# Patient Record
Sex: Female | Born: 1950
Health system: Southern US, Community
[De-identification: ages and names within clinical notes are randomized; demographics above are authoritative.]

## PROBLEM LIST (undated history)

## (undated) DIAGNOSIS — M1990 Unspecified osteoarthritis, unspecified site: Secondary | ICD-10-CM

## (undated) DIAGNOSIS — N200 Calculus of kidney: Secondary | ICD-10-CM

## (undated) DIAGNOSIS — I10 Essential (primary) hypertension: Secondary | ICD-10-CM

## (undated) DIAGNOSIS — E039 Hypothyroidism, unspecified: Secondary | ICD-10-CM

## (undated) DIAGNOSIS — D649 Anemia, unspecified: Secondary | ICD-10-CM

## (undated) DIAGNOSIS — K219 Gastro-esophageal reflux disease without esophagitis: Secondary | ICD-10-CM

## (undated) DIAGNOSIS — Z9889 Other specified postprocedural states: Secondary | ICD-10-CM

## (undated) DIAGNOSIS — R112 Nausea with vomiting, unspecified: Secondary | ICD-10-CM

## (undated) DIAGNOSIS — L89159 Pressure ulcer of sacral region, unspecified stage: Secondary | ICD-10-CM

## (undated) DIAGNOSIS — E119 Type 2 diabetes mellitus without complications: Secondary | ICD-10-CM

## (undated) DIAGNOSIS — C50911 Malignant neoplasm of unspecified site of right female breast: Secondary | ICD-10-CM

## (undated) DIAGNOSIS — G629 Polyneuropathy, unspecified: Secondary | ICD-10-CM

## (undated) DIAGNOSIS — G542 Cervical root disorders, not elsewhere classified: Secondary | ICD-10-CM

## (undated) DIAGNOSIS — Z87442 Personal history of urinary calculi: Secondary | ICD-10-CM

## (undated) DIAGNOSIS — A419 Sepsis, unspecified organism: Secondary | ICD-10-CM

## (undated) DIAGNOSIS — E7800 Pure hypercholesterolemia, unspecified: Secondary | ICD-10-CM

## (undated) DIAGNOSIS — G8220 Paraplegia, unspecified: Secondary | ICD-10-CM

## (undated) DIAGNOSIS — N319 Neuromuscular dysfunction of bladder, unspecified: Secondary | ICD-10-CM

## (undated) DIAGNOSIS — M869 Osteomyelitis, unspecified: Secondary | ICD-10-CM

## (undated) HISTORY — PX: ABDOMINAL HYSTERECTOMY: SHX81

## (undated) HISTORY — PX: DACROCYSTORHINOSTOMY: SHX1435

## (undated) HISTORY — PX: TONSILLECTOMY AND ADENOIDECTOMY: SUR1326

## (undated) HISTORY — DX: Cervical root disorders, not elsewhere classified: G54.2

## (undated) HISTORY — PX: BACK SURGERY: SHX140

## (undated) HISTORY — DX: Essential (primary) hypertension: I10

## (undated) HISTORY — DX: Unspecified osteoarthritis, unspecified site: M19.90

## (undated) HISTORY — PX: JOINT REPLACEMENT: SHX530

## (undated) HISTORY — PX: CALDWELL LUC: SHX1284

---

## 1988-12-06 DIAGNOSIS — N200 Calculus of kidney: Secondary | ICD-10-CM

## 1988-12-06 HISTORY — DX: Calculus of kidney: N20.0

## 2002-04-07 HISTORY — PX: EYE SURGERY: SHX253

## 2004-04-07 HISTORY — PX: POSTERIOR CERVICAL LAMINECTOMY: SHX2248

## 2004-12-03 ENCOUNTER — Encounter: Admission: RE | Admit: 2004-12-03 | Discharge: 2005-03-03 | Payer: Self-pay | Admitting: Neurosurgery

## 2005-03-05 ENCOUNTER — Encounter: Admission: RE | Admit: 2005-03-05 | Discharge: 2005-06-03 | Payer: Self-pay | Admitting: Neurosurgery

## 2005-06-04 ENCOUNTER — Encounter: Admission: RE | Admit: 2005-06-04 | Discharge: 2005-09-02 | Payer: Self-pay | Admitting: Neurosurgery

## 2006-07-10 ENCOUNTER — Encounter
Admission: RE | Admit: 2006-07-10 | Discharge: 2006-10-08 | Payer: Self-pay | Admitting: Physical Medicine and Rehabilitation

## 2006-10-09 ENCOUNTER — Encounter
Admission: RE | Admit: 2006-10-09 | Discharge: 2007-01-07 | Payer: Self-pay | Admitting: Physical Medicine and Rehabilitation

## 2009-01-31 ENCOUNTER — Encounter
Admission: RE | Admit: 2009-01-31 | Discharge: 2009-04-04 | Payer: Self-pay | Admitting: Physical Medicine and Rehabilitation

## 2009-04-07 DIAGNOSIS — C50911 Malignant neoplasm of unspecified site of right female breast: Secondary | ICD-10-CM

## 2009-04-07 HISTORY — PX: BREAST LUMPECTOMY: SHX2

## 2009-04-07 HISTORY — PX: BREAST BIOPSY: SHX20

## 2009-04-07 HISTORY — DX: Malignant neoplasm of unspecified site of right female breast: C50.911

## 2009-12-25 ENCOUNTER — Encounter: Admission: RE | Admit: 2009-12-25 | Discharge: 2009-12-25 | Payer: Self-pay | Admitting: Radiology

## 2010-01-17 ENCOUNTER — Encounter: Admission: RE | Admit: 2010-01-17 | Discharge: 2010-01-17 | Payer: Self-pay | Admitting: General Surgery

## 2010-01-22 ENCOUNTER — Ambulatory Visit (HOSPITAL_BASED_OUTPATIENT_CLINIC_OR_DEPARTMENT_OTHER): Admission: RE | Admit: 2010-01-22 | Discharge: 2010-01-22 | Payer: Self-pay | Admitting: General Surgery

## 2010-01-23 ENCOUNTER — Ambulatory Visit: Payer: Self-pay | Admitting: Oncology

## 2010-03-05 ENCOUNTER — Ambulatory Visit
Admission: RE | Admit: 2010-03-05 | Discharge: 2010-05-07 | Payer: Self-pay | Source: Home / Self Care | Attending: Radiation Oncology | Admitting: Radiation Oncology

## 2010-04-18 ENCOUNTER — Ambulatory Visit: Payer: Self-pay | Admitting: Oncology

## 2010-04-19 LAB — COMPREHENSIVE METABOLIC PANEL
ALT: 17 U/L (ref 0–35)
AST: 21 U/L (ref 0–37)
Albumin: 4.3 g/dL (ref 3.5–5.2)
Alkaline Phosphatase: 75 U/L (ref 39–117)
BUN: 12 mg/dL (ref 6–23)
CO2: 25 mEq/L (ref 19–32)
Calcium: 9.6 mg/dL (ref 8.4–10.5)
Chloride: 106 mEq/L (ref 96–112)
Creatinine, Ser: 0.83 mg/dL (ref 0.40–1.20)
Glucose, Bld: 105 mg/dL — ABNORMAL HIGH (ref 70–99)
Potassium: 4 mEq/L (ref 3.5–5.3)
Sodium: 140 mEq/L (ref 135–145)
Total Bilirubin: 0.4 mg/dL (ref 0.3–1.2)
Total Protein: 7.9 g/dL (ref 6.0–8.3)

## 2010-04-19 LAB — CBC WITH DIFFERENTIAL/PLATELET
BASO%: 0.3 % (ref 0.0–2.0)
Basophils Absolute: 0 10*3/uL (ref 0.0–0.1)
EOS%: 2.4 % (ref 0.0–7.0)
Eosinophils Absolute: 0.1 10*3/uL (ref 0.0–0.5)
HCT: 40.3 % (ref 34.8–46.6)
HGB: 14 g/dL (ref 11.6–15.9)
LYMPH%: 25.4 % (ref 14.0–49.7)
MCH: 32.6 pg (ref 25.1–34.0)
MCHC: 34.7 g/dL (ref 31.5–36.0)
MCV: 93.9 fL (ref 79.5–101.0)
MONO#: 0.4 10*3/uL (ref 0.1–0.9)
MONO%: 14.6 % — ABNORMAL HIGH (ref 0.0–14.0)
NEUT#: 1.6 10*3/uL (ref 1.5–6.5)
NEUT%: 57.3 % (ref 38.4–76.8)
Platelets: 165 10*3/uL (ref 145–400)
RBC: 4.29 10*6/uL (ref 3.70–5.45)
RDW: 14 % (ref 11.2–14.5)
WBC: 2.9 10*3/uL — ABNORMAL LOW (ref 3.9–10.3)
lymph#: 0.7 10*3/uL — ABNORMAL LOW (ref 0.9–3.3)
nRBC: 0 % (ref 0–0)

## 2010-04-19 LAB — VITAMIN D 25 HYDROXY (VIT D DEFICIENCY, FRACTURES): Vit D, 25-Hydroxy: 35 ng/mL (ref 30–89)

## 2010-05-08 ENCOUNTER — Ambulatory Visit: Payer: No Typology Code available for payment source | Admitting: Radiation Oncology

## 2010-05-15 ENCOUNTER — Ambulatory Visit: Payer: No Typology Code available for payment source | Attending: Radiation Oncology | Admitting: Radiation Oncology

## 2010-05-15 DIAGNOSIS — L988 Other specified disorders of the skin and subcutaneous tissue: Secondary | ICD-10-CM | POA: Insufficient documentation

## 2010-05-15 DIAGNOSIS — Y842 Radiological procedure and radiotherapy as the cause of abnormal reaction of the patient, or of later complication, without mention of misadventure at the time of the procedure: Secondary | ICD-10-CM | POA: Insufficient documentation

## 2010-05-15 DIAGNOSIS — C50919 Malignant neoplasm of unspecified site of unspecified female breast: Secondary | ICD-10-CM | POA: Insufficient documentation

## 2010-05-31 ENCOUNTER — Ambulatory Visit: Payer: No Typology Code available for payment source | Attending: Radiation Oncology | Admitting: Radiation Oncology

## 2010-06-20 LAB — COMPREHENSIVE METABOLIC PANEL
ALT: 27 U/L (ref 0–35)
Albumin: 4 g/dL (ref 3.5–5.2)
Alkaline Phosphatase: 72 U/L (ref 39–117)
Potassium: 3.9 mEq/L (ref 3.5–5.1)
Sodium: 141 mEq/L (ref 135–145)
Total Protein: 7.4 g/dL (ref 6.0–8.3)

## 2010-06-20 LAB — CBC
HCT: 39.3 % (ref 36.0–46.0)
Hemoglobin: 13.4 g/dL (ref 12.0–15.0)
MCH: 33 pg (ref 26.0–34.0)
MCHC: 34.1 g/dL (ref 30.0–36.0)
MCV: 96.8 fL (ref 78.0–100.0)
Platelets: 206 10*3/uL (ref 150–400)
RBC: 4.06 MIL/uL (ref 3.87–5.11)
RDW: 13.9 % (ref 11.5–15.5)
WBC: 4 10*3/uL (ref 4.0–10.5)

## 2010-06-20 LAB — DIFFERENTIAL
Basophils Relative: 1 % (ref 0–1)
Eosinophils Absolute: 0.1 10*3/uL (ref 0.0–0.7)
Monocytes Absolute: 0.6 10*3/uL (ref 0.1–1.0)
Monocytes Relative: 14 % — ABNORMAL HIGH (ref 3–12)

## 2010-06-20 LAB — URINALYSIS, ROUTINE W REFLEX MICROSCOPIC
Bilirubin Urine: NEGATIVE
Hgb urine dipstick: NEGATIVE
Specific Gravity, Urine: 1.023 (ref 1.005–1.030)
Urobilinogen, UA: 1 mg/dL (ref 0.0–1.0)

## 2010-07-19 ENCOUNTER — Encounter (HOSPITAL_BASED_OUTPATIENT_CLINIC_OR_DEPARTMENT_OTHER): Payer: No Typology Code available for payment source | Admitting: Oncology

## 2010-07-19 ENCOUNTER — Other Ambulatory Visit: Payer: Self-pay | Admitting: Oncology

## 2010-07-19 DIAGNOSIS — C50319 Malignant neoplasm of lower-inner quadrant of unspecified female breast: Secondary | ICD-10-CM

## 2010-07-19 LAB — CBC WITH DIFFERENTIAL/PLATELET
Basophils Absolute: 0 10*3/uL (ref 0.0–0.1)
Eosinophils Absolute: 0.1 10*3/uL (ref 0.0–0.5)
HCT: 37.7 % (ref 34.8–46.6)
LYMPH%: 24.6 % (ref 14.0–49.7)
MCV: 94.3 fL (ref 79.5–101.0)
MONO%: 10.9 % (ref 0.0–14.0)
NEUT#: 2.5 10*3/uL (ref 1.5–6.5)
NEUT%: 61.5 % (ref 38.4–76.8)
Platelets: 205 10*3/uL (ref 145–400)
RBC: 4 10*6/uL (ref 3.70–5.45)
nRBC: 0 % (ref 0–0)

## 2010-07-19 LAB — COMPREHENSIVE METABOLIC PANEL
BUN: 13 mg/dL (ref 6–23)
CO2: 26 mEq/L (ref 19–32)
Calcium: 9.7 mg/dL (ref 8.4–10.5)
Creatinine, Ser: 0.93 mg/dL (ref 0.40–1.20)
Glucose, Bld: 106 mg/dL — ABNORMAL HIGH (ref 70–99)
Total Bilirubin: 0.4 mg/dL (ref 0.3–1.2)

## 2010-07-19 LAB — VITAMIN D 25 HYDROXY (VIT D DEFICIENCY, FRACTURES): Vit D, 25-Hydroxy: 43 ng/mL (ref 30–89)

## 2010-09-12 ENCOUNTER — Encounter (INDEPENDENT_AMBULATORY_CARE_PROVIDER_SITE_OTHER): Payer: Self-pay | Admitting: General Surgery

## 2010-12-12 ENCOUNTER — Encounter (INDEPENDENT_AMBULATORY_CARE_PROVIDER_SITE_OTHER): Payer: Self-pay | Admitting: General Surgery

## 2010-12-12 ENCOUNTER — Ambulatory Visit (INDEPENDENT_AMBULATORY_CARE_PROVIDER_SITE_OTHER): Payer: No Typology Code available for payment source | Admitting: General Surgery

## 2010-12-12 VITALS — BP 122/72 | HR 76

## 2010-12-12 DIAGNOSIS — Z853 Personal history of malignant neoplasm of breast: Secondary | ICD-10-CM

## 2010-12-12 NOTE — Patient Instructions (Signed)
Your physical exam today shows radiation changes in the right breast, but no evidence of any cancer. Your mammograms are reportedly normal, and I'll review them later today. Continue taking the arimidex. Keep all of your office appointments with Dr. Pierce Crane. I will see you back in one year after you get your annual mammograms.

## 2010-12-12 NOTE — Progress Notes (Signed)
Chief Complaint  Patient presents with  . Follow-up    f/u right breast reck    HPI [name] is a 60 y.o. female.    This patient underwent right partial mastectomy and sentinel lymph node biopsy on January 22, 2010. Pathologically she had a T1c., N0, receptor positive, Ki-67 7%, HER-2-negative invasive ductal carcinoma. She underwent adjuvant radiation therapy under the guidance of Dr. Mitzi Hansen. She is taking arimkidex and is being followed closely by Dr. Pierce Crane.  She has no complaints about her breast. There is no pain or masses that she perceives. She had mammograms last week and [facility] and reported these are normal. We have requested those films and reports. HPI  Past Medical History  Diagnosis Date  . Hypertension   . Cervical neuropathy   . Chronic kidney disease   . Arthritis   . Cancer     Past Surgical History  Procedure Date  . Abdominal hysterectomy   . Tonsillectomy and adenoidectomy   . Caldwell luc   . Posterior cervical laminectomy   . Eye surgery 2004  . Breast surgery 2011    right lumpectomy    Family History  Problem Relation Age of Onset  . Heart disease Father     Social History History  Substance Use Topics  . Smoking status: Current Everyday Smoker -- 0.5 packs/day  . Smokeless tobacco: Not on file  . Alcohol Use: No    Allergies  Allergen Reactions  . Aspirin   . Ivp Dye (Iodinated Diagnostic Agents)     Current Outpatient Prescriptions  Medication Sig Dispense Refill  . anastrozole (ARIMIDEX) 1 MG tablet Take 1 mg by mouth daily.        . Calcium Carbonate-Vitamin D (CALCIUM 600+D) 600-200 MG-UNIT TABS Take by mouth daily.        . DULoxetine (CYMBALTA) 60 MG capsule Take 60 mg by mouth daily.        . folic acid (FOLVITE) 1 MG tablet Take 1 mg by mouth daily.        . furosemide (LASIX) 40 MG tablet Take 40 mg by mouth daily.        . Gabapentin, PHN, 600 MG TABS Take by mouth daily.        Marland Kitchen levothyroxine  (SYNTHROID, LEVOTHROID) 125 MCG tablet Take 125 mcg by mouth daily.        . magnesium oxide (MAG-OX) 400 MG tablet Take 400 mg by mouth 2 (two) times daily.        . metoprolol (TOPROL-XL) 100 MG 24 hr tablet Take 100 mg by mouth daily.        . MORPHINE SULFATE PO Take 10 mg by mouth 3 (three) times daily.        Marland Kitchen NIFEdipine (PROCARDIA XL/ADALAT-CC) 30 MG 24 hr tablet Take 30 mg by mouth daily.        . OXYCODONE HCL PO Take by mouth 4 (four) times daily. 10-650 mg       . zolpidem (AMBIEN) 10 MG tablet Take 10 mg by mouth at bedtime as needed.          Review of Systems Review of Systems 12 system review of systems is performed and is negative except as described above. Blood pressure 122/72, pulse 76.  Physical Exam Physical Exam Patient is in good spirits.  Neck reveals no adenopathy or mass. She cannot flex or extend her neck which is a chronic problem.  Lungs clear to  auscultation.  Breast right axillary wound is well-healed. There is no residual fluid. The right breast incision medially as well healed. Right breast tanning rom the radiation therapy, but no masses. Left breast is soft. Skin is healthy. There is no axillary adenopathy.Data Reviewed   Assessment    Invasive ductal carcinoma right breast, pathology stage TI C., N0, receptor positive, HER-2 negative. No evidence of recurrence one year following lumpectomy, sentinel biopsy, adjuvant radiation therapy and adjuvant arimidex.    Plan    Our review her recent mammograms.  Continue arimidex.  Return to see me in one year after her annual mammograms are done.       Kahley Leib M 12/12/2010, 1:02 PM

## 2011-02-17 ENCOUNTER — Ambulatory Visit: Payer: No Typology Code available for payment source | Admitting: Oncology

## 2011-02-17 ENCOUNTER — Other Ambulatory Visit: Payer: Self-pay | Admitting: Oncology

## 2011-02-17 ENCOUNTER — Other Ambulatory Visit (HOSPITAL_BASED_OUTPATIENT_CLINIC_OR_DEPARTMENT_OTHER): Payer: No Typology Code available for payment source | Admitting: Lab

## 2011-02-17 ENCOUNTER — Telehealth: Payer: Self-pay | Admitting: Oncology

## 2011-02-17 VITALS — BP 114/72 | HR 92 | Temp 97.4°F | Ht 62.0 in | Wt 209.5 lb

## 2011-02-17 DIAGNOSIS — C50919 Malignant neoplasm of unspecified site of unspecified female breast: Secondary | ICD-10-CM

## 2011-02-17 DIAGNOSIS — C50319 Malignant neoplasm of lower-inner quadrant of unspecified female breast: Secondary | ICD-10-CM

## 2011-02-17 LAB — COMPREHENSIVE METABOLIC PANEL
ALT: 15 U/L (ref 0–35)
AST: 22 U/L (ref 0–37)
Albumin: 4.1 g/dL (ref 3.5–5.2)
BUN: 14 mg/dL (ref 6–23)
CO2: 27 mEq/L (ref 19–32)
Calcium: 9.9 mg/dL (ref 8.4–10.5)
Chloride: 104 mEq/L (ref 96–112)
Creatinine, Ser: 0.8 mg/dL (ref 0.50–1.10)
Potassium: 4 mEq/L (ref 3.5–5.3)

## 2011-02-17 LAB — CBC WITH DIFFERENTIAL/PLATELET
Basophils Absolute: 0 10*3/uL (ref 0.0–0.1)
HCT: 36 % (ref 34.8–46.6)
HGB: 12.5 g/dL (ref 11.6–15.9)
MONO#: 0.4 10*3/uL (ref 0.1–0.9)
NEUT#: 2.4 10*3/uL (ref 1.5–6.5)
NEUT%: 66.6 % (ref 38.4–76.8)
RDW: 13.9 % (ref 11.2–14.5)
WBC: 3.6 10*3/uL — ABNORMAL LOW (ref 3.9–10.3)
lymph#: 0.7 10*3/uL — ABNORMAL LOW (ref 0.9–3.3)

## 2011-02-17 LAB — CANCER ANTIGEN 27.29: CA 27.29: 18 U/mL (ref 0–39)

## 2011-02-17 NOTE — Telephone Encounter (Signed)
Gv pt appt for may2013 

## 2011-02-17 NOTE — Progress Notes (Signed)
err

## 2011-05-16 ENCOUNTER — Other Ambulatory Visit: Payer: Self-pay | Admitting: *Deleted

## 2011-05-16 DIAGNOSIS — C50319 Malignant neoplasm of lower-inner quadrant of unspecified female breast: Secondary | ICD-10-CM

## 2011-05-16 MED ORDER — ANASTROZOLE 1 MG PO TABS: 1.0000 mg | ORAL_TABLET | Freq: Every day | ORAL | Status: DC

## 2011-06-06 NOTE — Telephone Encounter (Signed)
Received fax from [facility] stating pt has requested new prescription for anastrozole to be filled through them.  Called them at [phone], and spoke with pharmacist, to give pt 1 time 90 day supply of anastrozole 1 mg po daily (until next MD visit).

## 2011-08-07 ENCOUNTER — Telehealth: Payer: Self-pay | Admitting: Oncology

## 2011-08-07 NOTE — Telephone Encounter (Signed)
lmonvm adviisng the pt of her cancelled may appts that were r/s fro June due to the md will be out of the office

## 2011-08-22 ENCOUNTER — Other Ambulatory Visit: Payer: No Typology Code available for payment source | Admitting: Lab

## 2011-08-22 ENCOUNTER — Ambulatory Visit: Payer: No Typology Code available for payment source | Admitting: Oncology

## 2011-09-12 ENCOUNTER — Other Ambulatory Visit: Payer: Self-pay | Admitting: Physical Medicine and Rehabilitation

## 2011-09-12 DIAGNOSIS — M4712 Other spondylosis with myelopathy, cervical region: Secondary | ICD-10-CM

## 2011-09-12 DIAGNOSIS — IMO0002 Reserved for concepts with insufficient information to code with codable children: Secondary | ICD-10-CM

## 2011-09-18 ENCOUNTER — Other Ambulatory Visit: Payer: No Typology Code available for payment source

## 2011-09-18 ENCOUNTER — Inpatient Hospital Stay: Admission: RE | Admit: 2011-09-18 | Payer: No Typology Code available for payment source | Source: Ambulatory Visit

## 2011-09-22 ENCOUNTER — Other Ambulatory Visit: Payer: Self-pay | Admitting: *Deleted

## 2011-09-22 DIAGNOSIS — C50319 Malignant neoplasm of lower-inner quadrant of unspecified female breast: Secondary | ICD-10-CM

## 2011-09-22 MED ORDER — ANASTROZOLE 1 MG PO TABS: 1.0000 mg | ORAL_TABLET | Freq: Every day | ORAL | Status: DC

## 2011-09-22 NOTE — Telephone Encounter (Signed)
Patient out of anastrozole, ordered 30 day supply until she sees dr Donnie Coffin this month.

## 2011-09-23 ENCOUNTER — Ambulatory Visit
Admission: RE | Admit: 2011-09-23 | Discharge: 2011-09-23 | Disposition: A | Discharge status: Home / Self Care | Payer: Medicare HMO | Source: Ambulatory Visit | Attending: Physical Medicine and Rehabilitation | Admitting: Physical Medicine and Rehabilitation

## 2011-09-23 DIAGNOSIS — IMO0002 Reserved for concepts with insufficient information to code with codable children: Secondary | ICD-10-CM

## 2011-09-23 DIAGNOSIS — M4712 Other spondylosis with myelopathy, cervical region: Secondary | ICD-10-CM

## 2011-09-23 MED ORDER — GADOBENATE DIMEGLUMINE 529 MG/ML IV SOLN
17.0000 mL | Freq: Once | INTRAVENOUS | Status: AC | PRN
  Administered 2011-09-23: 17 mL via INTRAVENOUS

## 2011-09-29 ENCOUNTER — Telehealth: Payer: Self-pay | Admitting: *Deleted

## 2011-09-29 ENCOUNTER — Other Ambulatory Visit (HOSPITAL_BASED_OUTPATIENT_CLINIC_OR_DEPARTMENT_OTHER): Payer: Medicare HMO | Admitting: Lab

## 2011-09-29 ENCOUNTER — Ambulatory Visit (HOSPITAL_BASED_OUTPATIENT_CLINIC_OR_DEPARTMENT_OTHER): Payer: Medicare HMO | Admitting: Oncology

## 2011-09-29 VITALS — BP 117/70 | HR 91 | Temp 97.7°F | Ht 62.0 in | Wt 212.7 lb

## 2011-09-29 DIAGNOSIS — E559 Vitamin D deficiency, unspecified: Secondary | ICD-10-CM

## 2011-09-29 DIAGNOSIS — M25569 Pain in unspecified knee: Secondary | ICD-10-CM

## 2011-09-29 DIAGNOSIS — M542 Cervicalgia: Secondary | ICD-10-CM

## 2011-09-29 DIAGNOSIS — C50919 Malignant neoplasm of unspecified site of unspecified female breast: Secondary | ICD-10-CM

## 2011-09-29 LAB — CBC WITH DIFFERENTIAL/PLATELET
Basophils Absolute: 0 10*3/uL (ref 0.0–0.1)
EOS%: 1.4 % (ref 0.0–7.0)
Eosinophils Absolute: 0.1 10*3/uL (ref 0.0–0.5)
HCT: 35.3 % (ref 34.8–46.6)
HGB: 12.1 g/dL (ref 11.6–15.9)
LYMPH%: 22.2 % (ref 14.0–49.7)
MCH: 34.2 pg — ABNORMAL HIGH (ref 25.1–34.0)
MCV: 99.9 fL (ref 79.5–101.0)
MONO%: 6.7 % (ref 0.0–14.0)
NEUT#: 3.5 10*3/uL (ref 1.5–6.5)
NEUT%: 69.2 % (ref 38.4–76.8)
Platelets: 131 10*3/uL — ABNORMAL LOW (ref 145–400)
RDW: 13.4 % (ref 11.2–14.5)

## 2011-09-29 NOTE — Progress Notes (Signed)
Hematology and Oncology Follow Up Visit  [name] [id number] [birth year] 61 y.o. 09/29/2011 1:55 PM   DIAGNOSIS: 89 with a history of ER/PR positive HER-2 negative breast cancer status post lumpectomy 05/25/2009, status post radiation therapy completed 05/07/2010 on Arimidex, T1 CN 0    PAST THERAPY: As above   Interim History:  Patient is doing relatively well for breast cancer point of view. She is having some chronic knee pain in addition to neck pain she's had further investigations regarding her history of spinal stenosis and cervical myelopathy. She has active involvement neurosurgery. She is due for followup mammogram in August. She is taking Arimidex without significant side effects.  Medications: I have reviewed the patient's current medications.  Allergies:  Allergies  Allergen Reactions  . Aspirin   . Ivp Dye (Iodinated Diagnostic Agents)     Past Medical History, Surgical history, Social history, and Family History were reviewed and updated.  Review of Systems: Constitutional:  Negative for fever, chills, night sweats, anorexia, weight loss, pain. Cardiovascular: no chest pain or dyspnea on exertion Respiratory: negative Neurological: negative Dermatological: negative ENT: negative Skin Gastrointestinal: negative Genito-Urinary: negative Hematological and Lymphatic: negative Breast: negative Musculoskeletal: negative Remaining ROS negative.  Physical Exam:  Blood pressure 117/70, pulse 91, temperature 97.7 F (36.5 C), height 5\' 2"  (1.575 m), weight 212 lb 11.2 oz (96.48 kg).  ECOG: 1   General appearance: alert, cooperative and appears stated age Throat: lips, mucosa, and tongue normal; teeth and gums normal Resp: clear to auscultation bilaterally and normal percussion bilaterally Breasts: normal appearance, no masses or tenderness Cardio: regular rate and rhythm, S1, S2 normal, no murmur, click, rub or gallop and normal apical impulse GI: soft,  non-tender; bowel sounds normal; no masses,  no organomegaly Extremities: extremities normal, atraumatic, no cyanosis or edema Lymph nodes: Cervical, supraclavicular, and axillary nodes normal. Neurologic: Grossly normal   Lab Results: Lab Results  Component Value Date   WBC 5.0 09/29/2011   HGB 12.1 09/29/2011   HCT 35.3 09/29/2011   MCV 99.9 09/29/2011   PLT 131* 09/29/2011     Chemistry      Component Value Date/Time   NA 143 02/17/2011 1028   NA 143 02/17/2011 1028   NA 143 02/17/2011 1028   K 4.0 02/17/2011 1028   K 4.0 02/17/2011 1028   K 4.0 02/17/2011 1028   CL 104 02/17/2011 1028   CL 104 02/17/2011 1028   CL 104 02/17/2011 1028   CO2 27 02/17/2011 1028   CO2 27 02/17/2011 1028   CO2 27 02/17/2011 1028   BUN 14 02/17/2011 1028   BUN 14 02/17/2011 1028   BUN 14 02/17/2011 1028   CREATININE 0.80 02/17/2011 1028   CREATININE 0.80 02/17/2011 1028   CREATININE 0.80 02/17/2011 1028      Component Value Date/Time   CALCIUM 9.9 02/17/2011 1028   CALCIUM 9.9 02/17/2011 1028   CALCIUM 9.9 02/17/2011 1028   ALKPHOS 70 02/17/2011 1028   ALKPHOS 70 02/17/2011 1028   ALKPHOS 70 02/17/2011 1028   AST 22 02/17/2011 1028   AST 22 02/17/2011 1028   AST 22 02/17/2011 1028   ALT 15 02/17/2011 1028   ALT 15 02/17/2011 1028   ALT 15 02/17/2011 1028   BILITOT 0.4 02/17/2011 1028   BILITOT 0.4 02/17/2011 1028   BILITOT 0.4 02/17/2011 1028       Radiological Studies:  Mr Lumbar Spine Wo Contrast  09/24/2011  *RADIOLOGY REPORT*  Clinical Data: Recent fall.  Breast cancer.  Back pain.  Burning in both legs.  MRI LUMBAR SPINE WITHOUT CONTRAST  Technique:  Multiplanar and multiecho pulse sequences of the lumbar spine were obtained without intravenous contrast.  Comparison: None.  Findings: Normal alignment.  No fracture or mass lesion.  No focal bone marrow lesions are present.  Conus medullaris is normal and terminates at L1.  There is mild fatty infiltration of the filum  terminalis without enlargement.  L1-2:  Mild facet hypertrophy without stenosis.  L2-3:  Mild facet hypertrophy without spinal stenosis.  L3-4:  Moderate facet hypertrophy bilaterally.  Mild disc degeneration.  No disc protrusion or stenosis.  L4-5:  Moderate facet hypertrophy bilaterally without significant stenosis.  No disc protrusion.  L5-S1:  Mild to moderate central disc protrusion.  There is posterior displacement of the right S1 nerve root, without definite right S1 root compression.  Disc protrusion makes contact with the left S1 nerve root as well without displacement or compression. There is mild facet hypertrophy bilaterally.  Mild foraminal narrowing bilaterally.  IMPRESSION: Negative for fracture or metastatic disease.  Lumbar facet arthropathy without significant spinal stenosis.  Central disc protrusion L5 S1.  This is causing mild displacement of the right S1 nerve root without definite nerve root compression.  Original Report Authenticated By: Camelia Phenes, M.D.   Mr Cervical Spine W Wo Contrast  09/24/2011  *RADIOLOGY REPORT*  Clinical Data: Cervical spondylosis with myelopathy.  Breast cancer.  Recent falls.  BUN and creatinine were obtained on site at [facility] at [address] Results:  BUN 15 mg/dL,  Creatinine 0.7 mg/dL.  MRI CERVICAL SPINE WITHOUT AND WITH CONTRAST  Technique:  Multiplanar and multiecho pulse sequences of the cervical spine, to include the craniocervical junction and cervicothoracic junction, were obtained according to standard protocol without and with intravenous contrast.  Contrast: 17mL MULTIHANCE GADOBENATE DIMEGLUMINE 529 MG/ML IV SOLN  Comparison: None.  Findings: Prior decompressive laminectomy and posterior hardware fusion extending from C2-C6.  Focal areas of hyperintensity are present in the cord at C3 and C4-5 compatible with chronic myelomalacia.  There is extensive ossification of  the posterior longitudinal ligament which is markedly  thickened and ossified from C2-T2.  There has been no anterior fusion.  No evidence of metastatic disease in the cervical spine.  Negative for fracture.  C2-3:  Markedly thickened and ossified posterior longitudinal ligament.  Posterior laminectomy without significant cord compression.  There is a small area of hyperintensity in the cord on the right at the C3 level.  C3-4:  Ossification the posterior longitudinal ligament to the left of midline.  Posterior decompression.  Small hyperintensity in the cord on the left.  C4-5:  Extensive ossification of the posterior longitudinal ligament.  Hyperintensity in the cord on the left.  Cord is atrophic.  C5-6:  Extensive ossification of the posterior longitudinal ligament centrally into the right.  Posterior decompression without cord compression.  C6-7:  Extensive ossification of the posterior longitudinal ligament, more  prominent  on the left than the right.  There is moderate spinal stenosis with cord deformity.  There has been laminectomy at C6 but not C7.  The ossified ligament is markedly thickened on the left behind the C7 vertebral body contributing to spinal stenosis.  C7-T1:  Ossification of the posterior longitudinal ligament with mild spinal stenosis.  T1-2:  Large central disc protrusion and associated ossification of the posterior longitudinal ligament.  It is difficult to separate disc herniation from ossified ligament, however  there is compression of the cord and mild cord edema at this level.  It is difficult to determine if this is acute or chronic.  IMPRESSION: Negative for metastatic disease or fracture.  Posterior decompressive laminectomy and hardware fusion C2-C6. There is extensive ossification of the posterior longitudinal ligament throughout the cervical spine which is advanced.  There is myelomalacia of the cord and as described above related to prior cord injury.  There is  moderate spinal stenosis at C6-7 due to ossification of the posterior  longitudinal ligament, left greater than right.  Moderate to severe spinal stenosis at T1-2 with mild cord edema. This is due to a combination of large disc protrusion and ossified posterior longitudinal ligament.  CT myelogram would be helpful in this patient with extensive bony disease.  I discussed the findings by telephone with Dr. Manon Hilding at the time of interpretation.  Original Report Authenticated By: Camelia Phenes, M.D.     IMPRESSIONS AND PLAN: A 61 y.o. female with   History of node-negative ER/PR positive breast cancer status post lumpectomy radiation on Arimidex. She seems to be doing well from a breast cancer point of view I will see her in 6 months. She has a bone density scheduled shortly NA mammogram in August.  Spent more than half the time coordinating care, as well as discussion of BMI and its implications.      [name] 6/24/20131:55 PM Cell [phone]

## 2011-09-29 NOTE — Telephone Encounter (Signed)
Gave patient appointment for 04-02-2012 starting at 11:30am with labs md appointment to follow

## 2011-09-30 LAB — COMPREHENSIVE METABOLIC PANEL
AST: 18 U/L (ref 0–37)
Albumin: 3.7 g/dL (ref 3.5–5.2)
Alkaline Phosphatase: 68 U/L (ref 39–117)
BUN: 17 mg/dL (ref 6–23)
Creatinine, Ser: 0.87 mg/dL (ref 0.50–1.10)
Glucose, Bld: 118 mg/dL — ABNORMAL HIGH (ref 70–99)
Potassium: 3.9 mEq/L (ref 3.5–5.3)

## 2011-10-14 ENCOUNTER — Encounter (INDEPENDENT_AMBULATORY_CARE_PROVIDER_SITE_OTHER): Payer: Self-pay | Admitting: General Surgery

## 2011-10-20 ENCOUNTER — Other Ambulatory Visit: Payer: Self-pay | Admitting: Oncology

## 2011-10-20 DIAGNOSIS — C50919 Malignant neoplasm of unspecified site of unspecified female breast: Secondary | ICD-10-CM

## 2012-03-18 ENCOUNTER — Other Ambulatory Visit: Payer: Self-pay | Admitting: Orthopaedic Surgery

## 2012-04-01 ENCOUNTER — Encounter (HOSPITAL_COMMUNITY): Payer: Self-pay | Admitting: Pharmacy Technician

## 2012-04-02 ENCOUNTER — Other Ambulatory Visit: Payer: Medicare HMO | Admitting: Lab

## 2012-04-02 ENCOUNTER — Other Ambulatory Visit (HOSPITAL_BASED_OUTPATIENT_CLINIC_OR_DEPARTMENT_OTHER): Payer: Medicare HMO | Admitting: Lab

## 2012-04-02 ENCOUNTER — Ambulatory Visit: Payer: Medicare HMO | Admitting: Oncology

## 2012-04-02 DIAGNOSIS — C50919 Malignant neoplasm of unspecified site of unspecified female breast: Secondary | ICD-10-CM

## 2012-04-02 DIAGNOSIS — E559 Vitamin D deficiency, unspecified: Secondary | ICD-10-CM

## 2012-04-02 LAB — CBC WITH DIFFERENTIAL/PLATELET
Basophils Absolute: 0 10*3/uL (ref 0.0–0.1)
Eosinophils Absolute: 0.2 10*3/uL (ref 0.0–0.5)
HGB: 12.5 g/dL (ref 11.6–15.9)
LYMPH%: 29.4 % (ref 14.0–49.7)
MONO#: 0.4 10*3/uL (ref 0.1–0.9)
NEUT#: 2.2 10*3/uL (ref 1.5–6.5)
Platelets: 158 10*3/uL (ref 145–400)
RBC: 3.45 10*6/uL — ABNORMAL LOW (ref 3.70–5.45)
RDW: 14.8 % — ABNORMAL HIGH (ref 11.2–14.5)
WBC: 4 10*3/uL (ref 3.9–10.3)

## 2012-04-02 LAB — COMPREHENSIVE METABOLIC PANEL (CC13)
ALT: 37 U/L (ref 0–55)
CO2: 28 mEq/L (ref 22–29)
Calcium: 9.6 mg/dL (ref 8.4–10.4)
Chloride: 103 mEq/L (ref 98–107)
Glucose: 112 mg/dl — ABNORMAL HIGH (ref 70–99)
Sodium: 144 mEq/L (ref 136–145)
Total Protein: 7.5 g/dL (ref 6.4–8.3)

## 2012-04-03 LAB — VITAMIN D 25 HYDROXY (VIT D DEFICIENCY, FRACTURES): Vit D, 25-Hydroxy: 43 ng/mL (ref 30–89)

## 2012-04-05 ENCOUNTER — Encounter (HOSPITAL_COMMUNITY)
Admission: RE | Admit: 2012-04-05 | Discharge: 2012-04-05 | Payer: Medicare HMO | Source: Ambulatory Visit | Attending: Orthopaedic Surgery | Admitting: Orthopaedic Surgery

## 2012-04-05 ENCOUNTER — Encounter (HOSPITAL_COMMUNITY): Payer: Self-pay | Admitting: Vascular Surgery

## 2012-04-05 NOTE — Consult Note (Addendum)
Anesthesia Consult Note:  Updated 04/12/12, as patient missed her initial PAT appointment scheduled for 03/26/13.  Patient is a 61 year old female scheduled for left TKA on 04/14/11 by Dr. Jerl Santos.  Procedure is scheduled for choice anesthesia.  History includes DIFFICULT AIRWAY, recent former smoker (quit 04/07/12), obesity, HTN, kidney stones, arthritis, C2-T2 cervical laminectomy '06 with "fusion", peripheral neuropathy, breast cancer s/p right lumpectomy s/p radiation '11.  She has a history of multiple falls due to peripheral neuropathy and DJD of both knees.  Her PCP is Dr. Salli Real with [facility]. Her Oncologist has been Dr. Donnie Coffin, but he recently left [facility].  Her cervical laminectomy was done at [facility] on 09/28/03.  She was noted to have a difficult airway.  Notes indicate that "unable to visualize epiglottis without extension of neck.  Fastrach [intubating LMA] #4 placed easily with good chest rise and #7 ETT passed easily..."  A #4 Supreme LMA was used during her right breast surgery on 01/22/10.  I spoke with her during her PAT visit regarding difficult airway history.  She has a short, large neck and limited neck mobility. She reports that Dr. Jerl Santos did discuss the possibility of spinal anesthesia, but by her account they both preferred general anesthesia due to the risk of still needing general anesthesia if she did not experience the full desired effects of a spinal.  She also expressed that she was concerned that a spinal may exacerbate her neuropathy symptoms.  I discussed the possibility of a femoral nerve block plus general anesthesia with LMA or ETT using glidescope or even an awake intubation using a fiberoptic scope.  She is comfortable talking with her assigned anesthesiologist further on the day of surgery to discuss a more definitive anesthesia plan once he/she has evaluated her and talked to Dr. Jerl Santos. She requests that her husband be present when the  anesthesiologist speaks with her before surgery.      EKG on 04/12/12 showed NSR, LVH, poor r wave progression. Her r wave is lower in V4, but otherwise not significantly changed since 05/22/09.  She denies chest pain history or prior cardiac testing.  She denies SOB or significant lower extremity edema.  Her activity has been limited due her peripheral neuropathy and now severe bilateral knee pain.  Exam shows her sitting in a reclining wheelchair.  Heart with RRR, no murmur noted.  Lungs clear.  No significant LE pitting edema.  Mallampati is probably a class III.  Neck is short, large.  Neck mobility is significantly restricted in all directions.    CXR on 04/12/12 showed no acute cardiopulmonary disease.  Preoperative labs.  Preoperative labs noted.  Her anesthesiologist will discuss the definitive plan after his/her evaluation tomorrow, but anticipate she can proceed.  Shonna Chock, PA-C 04/12/12 1215

## 2012-04-05 NOTE — Pre-Procedure Instructions (Signed)
[address] Helma Argyle  04/05/2012   Your procedure is scheduled on:  Tues, Jan 7 @ 2:00 PM  Report to [facility] at 12:00 PM.  Call this number if you have problems the morning of surgery: [phone]   Remember:   Do not eat food:After Midnight.    Take these medicines the morning of surgery with A SIP OF WATER: Anastrozole(Arimidex),Cymbalta(Duloxetine),Gabapentin(Neurontin),Synthroid(Levothyroxine),Metoprolol(Lopressor),Nifedipine(Procardia),and Pain Pill(if needed)   Do not wear jewelry, make-up or nail polish.  Do not wear lotions, powders, or perfumes. You may wear deodorant.  Do not shave 48 hours prior to surgery. .  Do not bring valuables to the hospital.  Contacts, dentures or bridgework may not be worn into surgery.  Leave suitcase in the car. After surgery it may be brought to your room.  For patients admitted to the hospital, checkout time is 11:00 AM the day of discharge.   Patients discharged the day of surgery will not be allowed to drive home.    Special Instructions: Shower using CHG 2 nights before surgery and the night before surgery.  If you shower the day of surgery use CHG.  Use special wash - you have one bottle of CHG for all showers.  You should use approximately 1/3 of the bottle for each shower.   Please read over the following fact sheets that you were given: Pain Booklet, Coughing and Deep Breathing, Blood Transfusion Information, Total Joint Packet, MRSA Information and Surgical Site Infection Prevention

## 2012-04-09 ENCOUNTER — Other Ambulatory Visit (HOSPITAL_COMMUNITY): Payer: Medicare HMO

## 2012-04-11 NOTE — H&P (Signed)
TOTAL KNEE ADMISSION H&P  Patient is being admitted for left total knee arthroplasty.  Subjective:  Chief Complaint:left knee pain.  HPI: [name], 62 y.o. female, has a history of pain and functional disability in the left knee due to arthritis and has failed non-surgical conservative treatments for greater than 12 weeks to includeNSAID's and/or analgesics, corticosteriod injections, viscosupplementation injections, flexibility and strengthening excercises, use of assistive devices, weight reduction as appropriate and activity modification.  Onset of symptoms was gradual, starting 7 years ago with gradually worsening course since that time. The patient noted no past surgery on the left knee(s).  Patient currently rates pain in the left knee(s) at 10 out of 10 with activity. Patient has night pain, worsening of pain with activity and weight bearing, pain that interferes with activities of daily living, pain with passive range of motion, crepitus and joint swelling.  Patient has evidence of subchondral sclerosis, periarticular osteophytes, joint subluxation and joint space narrowing by imaging studies. This patient has had no surg. There is no active infection.  There are no active problems to display for this patient.  Past Medical History  Diagnosis Date  . Hypertension   . Cervical neuropathy   . Chronic kidney disease   . Arthritis   . Cancer   . Difficult intubation     Fastrach #4 LMA then # 7 ETT used in 2006 [facility], cervical laminectomy)    Past Surgical History  Procedure Date  . Abdominal hysterectomy   . Tonsillectomy and adenoidectomy   . Caldwell luc   . Posterior cervical laminectomy   . Eye surgery 2004  . Breast surgery 2011    right lumpectomy    No prescriptions prior to admission   Allergies  Allergen Reactions  . Aspirin Hives  . Ivp Dye (Iodinated Diagnostic Agents)     "hot and sweaty and almost passed out"  . Sulfa Antibiotics Hives    History    Substance Use Topics  . Smoking status: Current Every Day Smoker -- 0.5 packs/day  . Smokeless tobacco: Not on file  . Alcohol Use: No    Family History  Problem Relation Age of Onset  . Heart disease Father      Review of Systems  Constitutional: Negative.   HENT: Negative.   Eyes: Negative.   Respiratory: Negative.   Cardiovascular: Negative.   Gastrointestinal: Negative.   Genitourinary: Negative.   Musculoskeletal: Positive for joint pain.  Skin: Negative.   Neurological: Negative.   Endo/Heme/Allergies: Negative.   Psychiatric/Behavioral: Negative.     Objective:  Physical Exam  Constitutional: She is oriented to person, place, and time. She appears well-developed.  HENT:  Head: Normocephalic.  Eyes: Conjunctivae normal are normal. Pupils are equal, round, and reactive to light.  Neck: Normal range of motion. Neck supple.  Cardiovascular: Normal rate and regular rhythm.   Respiratory: Effort normal and breath sounds normal.  GI: Soft. Bowel sounds are normal.  Musculoskeletal:       Left knee - rom 10-85. Pain and crepitation with rom  1+ effusion  Neurological: She is oriented to person, place, and time.  Skin: Skin is dry.  Psychiatric: She has a normal mood and affect.    Vital signs in last 24 hours:    Labs:   Estimated Body mass index is 38.90 kg/(m^2) as calculated from the following:   Height as of 09/29/11: 5\' 2" (1.575 m).   Weight as of 09/29/11: 212 lb 11.2 oz(96.48 kg).   Imaging  Review Plain radiographs demonstrate severe degenerative joint disease of the left knee(s). The overall alignment isneutral. The bone quality appears to be fair for age and reported activity level.  Assessment/Plan:  End stage arthritis, left knee   The patient history, physical examination, clinical judgment of the provider and imaging studies are consistent with end stage degenerative joint disease of the left knee(s) and total knee arthroplasty is deemed  medically necessary. The treatment options including medical management, injection therapy arthroscopy and arthroplasty were discussed at length. The risks and benefits of total knee arthroplasty were presented and reviewed. The risks due to aseptic loosening, infection, stiffness, patella tracking problems, thromboembolic complications and other imponderables were discussed. The patient acknowledged the explanation, agreed to proceed with the plan and consent was signed. Patient is being admitted for inpatient treatment for surgery, pain control, PT, OT, prophylactic antibiotics, VTE prophylaxis, progressive ambulation and ADL's and discharge planning. The patient is planning to be discharged to skilled nursing facility

## 2012-04-12 ENCOUNTER — Encounter (HOSPITAL_COMMUNITY)
Admission: RE | Admit: 2012-04-12 | Discharge: 2012-04-12 | Disposition: A | Discharge status: Home / Self Care | Payer: Medicare HMO | Source: Ambulatory Visit | Attending: Orthopaedic Surgery | Admitting: Orthopaedic Surgery

## 2012-04-12 ENCOUNTER — Encounter (HOSPITAL_COMMUNITY): Payer: Self-pay

## 2012-04-12 HISTORY — DX: Hypothyroidism, unspecified: E03.9

## 2012-04-12 LAB — BASIC METABOLIC PANEL
BUN: 16 mg/dL (ref 6–23)
CO2: 26 mEq/L (ref 19–32)
Chloride: 104 mEq/L (ref 96–112)
Creatinine, Ser: 0.9 mg/dL (ref 0.50–1.10)
Glucose, Bld: 123 mg/dL — ABNORMAL HIGH (ref 70–99)
Potassium: 4.6 mEq/L (ref 3.5–5.1)

## 2012-04-12 LAB — CBC WITH DIFFERENTIAL/PLATELET
Basophils Absolute: 0 10*3/uL (ref 0.0–0.1)
Basophils Relative: 0 % (ref 0–1)
Eosinophils Relative: 4 % (ref 0–5)
HCT: 40.6 % (ref 36.0–46.0)
Hemoglobin: 13.6 g/dL (ref 12.0–15.0)
MCHC: 33.5 g/dL (ref 30.0–36.0)
MCV: 102.5 fL — ABNORMAL HIGH (ref 78.0–100.0)
Monocytes Absolute: 0.7 10*3/uL (ref 0.1–1.0)
Monocytes Relative: 14 % — ABNORMAL HIGH (ref 3–12)
Neutro Abs: 2.3 10*3/uL (ref 1.7–7.7)
RDW: 15.7 % — ABNORMAL HIGH (ref 11.5–15.5)

## 2012-04-12 LAB — SURGICAL PCR SCREEN
MRSA, PCR: POSITIVE — AB
Staphylococcus aureus: POSITIVE — AB

## 2012-04-12 LAB — URINALYSIS, ROUTINE W REFLEX MICROSCOPIC
Glucose, UA: NEGATIVE mg/dL
Hgb urine dipstick: NEGATIVE
Leukocytes, UA: NEGATIVE
Protein, ur: NEGATIVE mg/dL
Specific Gravity, Urine: 1.018 (ref 1.005–1.030)
Urobilinogen, UA: 0.2 mg/dL (ref 0.0–1.0)

## 2012-04-12 LAB — ABO/RH: ABO/RH(D): B POS

## 2012-04-12 LAB — TYPE AND SCREEN
ABO/RH(D): B POS
Antibody Screen: NEGATIVE

## 2012-04-12 MED ORDER — CEFAZOLIN SODIUM-DEXTROSE 2-3 GM-% IV SOLR
2.0000 g | INTRAVENOUS | Status: AC
  Administered 2012-04-13: 2 g via INTRAVENOUS
  Filled 2012-04-12: qty 50

## 2012-04-12 NOTE — Progress Notes (Signed)
[facility] TO REVIEW CHART, ORDER FOR ANESTH CONSULT.  PATIENT INSTRUCTED TO NOTIFY DR. Shon Baton OF CELLULITIS AFTER SHE IS SEEN AT URGENT CARE. PATIENT STATED SHE WOULD CALL DR. Shon Baton TODAY.

## 2012-04-12 NOTE — Progress Notes (Signed)
04/12/12 1252  OBSTRUCTIVE SLEEP APNEA  Have you ever been diagnosed with sleep apnea through a sleep study? No  Do you snore loudly (loud enough to be heard through closed doors)?  1  Do you often feel tired, fatigued, or sleepy during the daytime? 0  Has anyone observed you stop breathing during your sleep? 0  Do you have, or are you being treated for high blood pressure? 1  BMI more than 35 kg/m2? 1  Age over 64 years old? 1  Neck circumference greater than 40 cm/18 inches? 0  Gender: 0  Obstructive Sleep Apnea Score 4   Score 4 or greater  Results sent to PCP

## 2012-04-12 NOTE — Anesthesia Preprocedure Evaluation (Addendum)
Anesthesia Evaluation  Patient identified by MRN, date of birth, ID band Patient awake    Reviewed: Allergy & Precautions, H&P , NPO status , Patient's Chart, lab work & pertinent test results, reviewed documented beta blocker date and time   History of Anesthesia Complications (+) PONV and DIFFICULT AIRWAY  Airway Mallampati: III TM Distance: >3 FB Neck ROM: Full    Dental No notable dental hx. (+) Dental Advisory Given and Teeth Intact   Pulmonary Current Smoker,  breath sounds clear to auscultation  Pulmonary exam normal       Cardiovascular hypertension, Pt. on medications and Pt. on home beta blockers Rhythm:Regular Rate:Normal     Neuro/Psych Anxiety Chronic neck and back pain: narcotics daily    GI/Hepatic negative GI ROS, Neg liver ROS,   Endo/Other  Hypothyroidism Morbid obesity  Renal/GU negative Renal ROS     Musculoskeletal   Abdominal (+) + obese,   Peds  Hematology   Anesthesia Other Findings   Reproductive/Obstetrics                           Anesthesia Physical Anesthesia Plan  ASA: III  Anesthesia Plan: General   Post-op Pain Management:    Induction: Intravenous  Airway Management Planned: LMA  Additional Equipment:   Intra-op Plan:   Post-operative Plan:   Informed Consent: I have reviewed the patients History and Physical, chart, labs and discussed the procedure including the risks, benefits and alternatives for the proposed anesthesia with the patient or authorized representative who has indicated his/her understanding and acceptance.   Dental advisory given  Plan Discussed with: CRNA and Surgeon  Anesthesia Plan Comments: (See my anesthesia note regarding DIFFICULT AIRWAY.  Shonna Chock, PA-C Plan routine monitors, GA- LMA OK, femoral nerve block for post op analgesia)       Anesthesia Quick Evaluation

## 2012-04-12 NOTE — Progress Notes (Signed)
04/12/12 1303  OBSTRUCTIVE SLEEP APNEA  Have you ever been diagnosed with sleep apnea through a sleep study? No  Do you snore loudly (loud enough to be heard through closed doors)?  1  Do you often feel tired, fatigued, or sleepy during the daytime? 0  Has anyone observed you stop breathing during your sleep? 0  Do you have, or are you being treated for high blood pressure? 1  BMI more than 35 kg/m2? 1  Age over 63 years old? 1  Neck circumference greater than 40 cm/18 inches? 0  Gender: 0  Obstructive Sleep Apnea Score 4   Score 4 or greater  Results sent to PCP

## 2012-04-13 ENCOUNTER — Inpatient Hospital Stay (HOSPITAL_COMMUNITY): Payer: Medicare HMO | Admitting: Vascular Surgery

## 2012-04-13 ENCOUNTER — Encounter (HOSPITAL_COMMUNITY): Payer: Self-pay | Admitting: Vascular Surgery

## 2012-04-13 ENCOUNTER — Encounter (HOSPITAL_COMMUNITY): Payer: Self-pay | Admitting: *Deleted

## 2012-04-13 ENCOUNTER — Inpatient Hospital Stay (HOSPITAL_COMMUNITY)
Admission: RE | Admit: 2012-04-13 | Discharge: 2012-04-20 | DRG: 470 | Disposition: A | Discharge status: Skilled Nursing Facility | Payer: Medicare HMO | Source: Ambulatory Visit | Attending: Orthopaedic Surgery | Admitting: Orthopaedic Surgery

## 2012-04-13 ENCOUNTER — Encounter (HOSPITAL_COMMUNITY)
Admission: RE | Disposition: A | Discharge status: Skilled Nursing Facility | Payer: Self-pay | Source: Ambulatory Visit | Attending: Orthopaedic Surgery

## 2012-04-13 DIAGNOSIS — N189 Chronic kidney disease, unspecified: Secondary | ICD-10-CM | POA: Diagnosis present

## 2012-04-13 DIAGNOSIS — M1712 Unilateral primary osteoarthritis, left knee: Secondary | ICD-10-CM

## 2012-04-13 DIAGNOSIS — N179 Acute kidney failure, unspecified: Secondary | ICD-10-CM | POA: Diagnosis present

## 2012-04-13 DIAGNOSIS — I10 Essential (primary) hypertension: Secondary | ICD-10-CM | POA: Diagnosis present

## 2012-04-13 DIAGNOSIS — R410 Disorientation, unspecified: Secondary | ICD-10-CM

## 2012-04-13 DIAGNOSIS — E559 Vitamin D deficiency, unspecified: Secondary | ICD-10-CM

## 2012-04-13 DIAGNOSIS — R000 Tachycardia, unspecified: Secondary | ICD-10-CM | POA: Diagnosis present

## 2012-04-13 DIAGNOSIS — I129 Hypertensive chronic kidney disease with stage 1 through stage 4 chronic kidney disease, or unspecified chronic kidney disease: Secondary | ICD-10-CM | POA: Diagnosis present

## 2012-04-13 DIAGNOSIS — C50919 Malignant neoplasm of unspecified site of unspecified female breast: Secondary | ICD-10-CM

## 2012-04-13 DIAGNOSIS — F17200 Nicotine dependence, unspecified, uncomplicated: Secondary | ICD-10-CM | POA: Diagnosis present

## 2012-04-13 DIAGNOSIS — M1710 Unilateral primary osteoarthritis, unspecified knee: Principal | ICD-10-CM | POA: Diagnosis present

## 2012-04-13 HISTORY — DX: Calculus of kidney: N20.0

## 2012-04-13 HISTORY — DX: Nausea with vomiting, unspecified: R11.2

## 2012-04-13 HISTORY — PX: TOTAL KNEE ARTHROPLASTY WITH REVISION COMPONENTS: SHX6198

## 2012-04-13 HISTORY — DX: Pure hypercholesterolemia, unspecified: E78.00

## 2012-04-13 HISTORY — DX: Other specified postprocedural states: Z98.890

## 2012-04-13 SURGERY — TOTAL KNEE ARTHROPLASTY WITH REVISION COMPONENTS
Anesthesia: General | Site: Knee | Laterality: Left | Wound class: Clean

## 2012-04-13 MED ORDER — PROPOFOL 10 MG/ML IV BOLUS
INTRAVENOUS | Status: DC | PRN
  Administered 2012-04-13: 200 mg via INTRAVENOUS

## 2012-04-13 MED ORDER — LEVOTHYROXINE SODIUM 125 MCG PO TABS
125.0000 ug | ORAL_TABLET | Freq: Every day | ORAL | Status: DC
  Administered 2012-04-14 – 2012-04-20 (×7): 125 ug via ORAL
  Filled 2012-04-13 (×8): qty 1

## 2012-04-13 MED ORDER — MAGNESIUM OXIDE 400 MG PO TABS: 400.0000 mg | ORAL_TABLET | Freq: Two times a day (BID) | ORAL | Status: DC

## 2012-04-13 MED ORDER — ONDANSETRON HCL 4 MG/2ML IJ SOLN: 4.0000 mg | Freq: Four times a day (QID) | INTRAMUSCULAR | Status: DC | PRN

## 2012-04-13 MED ORDER — CHLORHEXIDINE GLUCONATE 4 % EX LIQD: 60.0000 mL | Freq: Once | CUTANEOUS | Status: DC

## 2012-04-13 MED ORDER — BISACODYL 5 MG PO TBEC
5.0000 mg | DELAYED_RELEASE_TABLET | Freq: Every day | ORAL | Status: DC | PRN
  Administered 2012-04-18: 5 mg via ORAL
  Filled 2012-04-13: qty 1

## 2012-04-13 MED ORDER — OXYCODONE HCL 5 MG PO TABS: 5.0000 mg | ORAL_TABLET | Freq: Once | ORAL | Status: DC | PRN

## 2012-04-13 MED ORDER — HYDROMORPHONE HCL PF 1 MG/ML IJ SOLN
0.2500 mg | INTRAMUSCULAR | Status: DC | PRN
  Administered 2012-04-13 (×2): 0.5 mg via INTRAVENOUS

## 2012-04-13 MED ORDER — NIFEDIPINE ER 30 MG PO TB24
30.0000 mg | ORAL_TABLET | Freq: Every day | ORAL | Status: DC
  Administered 2012-04-14 – 2012-04-20 (×7): 30 mg via ORAL
  Filled 2012-04-13 (×7): qty 1

## 2012-04-13 MED ORDER — OXYCODONE-ACETAMINOPHEN 5-325 MG PO TABS
2.0000 | ORAL_TABLET | Freq: Four times a day (QID) | ORAL | Status: DC | PRN
  Administered 2012-04-13 – 2012-04-14 (×2): 2 via ORAL
  Filled 2012-04-13 (×3): qty 2

## 2012-04-13 MED ORDER — ONDANSETRON HCL 4 MG/2ML IJ SOLN
INTRAMUSCULAR | Status: DC | PRN
  Administered 2012-04-13: 4 mg via INTRAVENOUS

## 2012-04-13 MED ORDER — FENTANYL CITRATE 0.05 MG/ML IJ SOLN
INTRAMUSCULAR | Status: AC
  Administered 2012-04-13: 100 ug
  Filled 2012-04-13: qty 2

## 2012-04-13 MED ORDER — ANASTROZOLE 1 MG PO TABS
1.0000 mg | ORAL_TABLET | Freq: Every day | ORAL | Status: DC
  Administered 2012-04-14 – 2012-04-20 (×7): 1 mg via ORAL
  Filled 2012-04-13 (×7): qty 1

## 2012-04-13 MED ORDER — OXYCODONE HCL 5 MG/5ML PO SOLN: 5.0000 mg | Freq: Once | ORAL | Status: DC | PRN

## 2012-04-13 MED ORDER — METOCLOPRAMIDE HCL 5 MG/ML IJ SOLN
5.0000 mg | Freq: Three times a day (TID) | INTRAMUSCULAR | Status: DC | PRN
  Filled 2012-04-13: qty 2

## 2012-04-13 MED ORDER — OXYCODONE-ACETAMINOPHEN 10-650 MG PO TABS: 1.0000 | ORAL_TABLET | Freq: Four times a day (QID) | ORAL | Status: DC | PRN

## 2012-04-13 MED ORDER — SODIUM CHLORIDE 0.9 % IR SOLN
Status: DC | PRN
  Administered 2012-04-13: 3000 mL

## 2012-04-13 MED ORDER — METHOCARBAMOL 100 MG/ML IJ SOLN
500.0000 mg | Freq: Four times a day (QID) | INTRAVENOUS | Status: DC | PRN
  Filled 2012-04-13: qty 5

## 2012-04-13 MED ORDER — PHENYLEPHRINE HCL 10 MG/ML IJ SOLN
INTRAMUSCULAR | Status: DC | PRN
  Administered 2012-04-13: 40 ug via INTRAVENOUS

## 2012-04-13 MED ORDER — HYDROMORPHONE HCL PF 1 MG/ML IJ SOLN
INTRAMUSCULAR | Status: AC
  Filled 2012-04-13: qty 1

## 2012-04-13 MED ORDER — ALUM & MAG HYDROXIDE-SIMETH 200-200-20 MG/5ML PO SUSP: 30.0000 mL | ORAL | Status: DC | PRN

## 2012-04-13 MED ORDER — DULOXETINE HCL 60 MG PO CPEP
60.0000 mg | ORAL_CAPSULE | Freq: Every day | ORAL | Status: DC
  Administered 2012-04-14 – 2012-04-20 (×7): 60 mg via ORAL
  Filled 2012-04-13 (×7): qty 1

## 2012-04-13 MED ORDER — LIDOCAINE 5 % EX OINT: TOPICAL_OINTMENT | Freq: Every day | CUTANEOUS | Status: DC | PRN

## 2012-04-13 MED ORDER — ZOLPIDEM TARTRATE 5 MG PO TABS
5.0000 mg | ORAL_TABLET | Freq: Every evening | ORAL | Status: DC | PRN
  Filled 2012-04-13: qty 1

## 2012-04-13 MED ORDER — FENTANYL CITRATE 0.05 MG/ML IJ SOLN
INTRAMUSCULAR | Status: DC | PRN
  Administered 2012-04-13: 50 ug via INTRAVENOUS
  Administered 2012-04-13: 100 ug via INTRAVENOUS
  Administered 2012-04-13 (×3): 50 ug via INTRAVENOUS

## 2012-04-13 MED ORDER — ALBUTEROL SULFATE HFA 108 (90 BASE) MCG/ACT IN AERS
INHALATION_SPRAY | RESPIRATORY_TRACT | Status: DC | PRN
  Administered 2012-04-13: 2 via RESPIRATORY_TRACT

## 2012-04-13 MED ORDER — MENTHOL 3 MG MT LOZG
1.0000 | LOZENGE | OROMUCOSAL | Status: DC | PRN
  Filled 2012-04-13 (×2): qty 9

## 2012-04-13 MED ORDER — METHOCARBAMOL 100 MG/ML IJ SOLN
500.0000 mg | INTRAVENOUS | Status: AC
  Administered 2012-04-13: 500 mg via INTRAVENOUS
  Filled 2012-04-13: qty 5

## 2012-04-13 MED ORDER — BUPIVACAINE-EPINEPHRINE PF 0.5-1:200000 % IJ SOLN
INTRAMUSCULAR | Status: DC | PRN
  Administered 2012-04-13: 30 mL

## 2012-04-13 MED ORDER — WARFARIN VIDEO: Freq: Once | Status: DC

## 2012-04-13 MED ORDER — LIDOCAINE 5 % EX OINT
1.0000 "application " | TOPICAL_OINTMENT | CUTANEOUS | Status: DC | PRN
  Filled 2012-04-13: qty 35.44

## 2012-04-13 MED ORDER — PATIENT'S GUIDE TO USING COUMADIN BOOK
Freq: Once | Status: AC
  Administered 2012-04-13: 22:00:00
  Filled 2012-04-13: qty 1

## 2012-04-13 MED ORDER — DOCUSATE SODIUM 100 MG PO CAPS
100.0000 mg | ORAL_CAPSULE | Freq: Two times a day (BID) | ORAL | Status: DC
  Administered 2012-04-14 – 2012-04-20 (×13): 100 mg via ORAL
  Filled 2012-04-13 (×15): qty 1

## 2012-04-13 MED ORDER — PROMETHAZINE HCL 25 MG/ML IJ SOLN: 6.2500 mg | INTRAMUSCULAR | Status: DC | PRN

## 2012-04-13 MED ORDER — METOPROLOL SUCCINATE ER 100 MG PO TB24
100.0000 mg | ORAL_TABLET | Freq: Every day | ORAL | Status: DC
  Administered 2012-04-14 – 2012-04-20 (×8): 100 mg via ORAL
  Filled 2012-04-13 (×8): qty 1

## 2012-04-13 MED ORDER — ACETAMINOPHEN 650 MG RE SUPP: 650.0000 mg | Freq: Four times a day (QID) | RECTAL | Status: DC | PRN

## 2012-04-13 MED ORDER — MAGNESIUM OXIDE 400 (241.3 MG) MG PO TABS
400.0000 mg | ORAL_TABLET | Freq: Two times a day (BID) | ORAL | Status: DC
  Administered 2012-04-14 – 2012-04-20 (×12): 400 mg via ORAL
  Filled 2012-04-13 (×16): qty 1

## 2012-04-13 MED ORDER — SENNOSIDES-DOCUSATE SODIUM 8.6-50 MG PO TABS: 1.0000 | ORAL_TABLET | Freq: Every evening | ORAL | Status: DC | PRN

## 2012-04-13 MED ORDER — ACETAMINOPHEN 325 MG PO TABS
650.0000 mg | ORAL_TABLET | Freq: Four times a day (QID) | ORAL | Status: DC | PRN
  Administered 2012-04-15 – 2012-04-16 (×2): 650 mg via ORAL
  Filled 2012-04-13 (×2): qty 2

## 2012-04-13 MED ORDER — MIDAZOLAM HCL 2 MG/2ML IJ SOLN
INTRAMUSCULAR | Status: AC
  Administered 2012-04-13: 2 mg via INTRAVENOUS
  Filled 2012-04-13: qty 2

## 2012-04-13 MED ORDER — SCOPOLAMINE 1 MG/3DAYS TD PT72
MEDICATED_PATCH | TRANSDERMAL | Status: AC
  Administered 2012-04-13: 1.5 mg
  Filled 2012-04-13: qty 1

## 2012-04-13 MED ORDER — MIDAZOLAM HCL 2 MG/2ML IJ SOLN
0.5000 mg | Freq: Once | INTRAMUSCULAR | Status: AC | PRN
  Administered 2012-04-13: 2 mg via INTRAVENOUS

## 2012-04-13 MED ORDER — MEPERIDINE HCL 25 MG/ML IJ SOLN: 6.2500 mg | INTRAMUSCULAR | Status: DC | PRN

## 2012-04-13 MED ORDER — FUROSEMIDE 40 MG PO TABS
40.0000 mg | ORAL_TABLET | Freq: Every day | ORAL | Status: DC
  Administered 2012-04-14 – 2012-04-15 (×2): 40 mg via ORAL
  Filled 2012-04-13 (×2): qty 1

## 2012-04-13 MED ORDER — CEFAZOLIN SODIUM-DEXTROSE 2-3 GM-% IV SOLR
2.0000 g | Freq: Four times a day (QID) | INTRAVENOUS | Status: AC
  Administered 2012-04-13 – 2012-04-14 (×2): 2 g via INTRAVENOUS
  Filled 2012-04-13 (×2): qty 50

## 2012-04-13 MED ORDER — OXYCODONE HCL 5 MG PO TABS
5.0000 mg | ORAL_TABLET | ORAL | Status: DC | PRN
  Administered 2012-04-14 – 2012-04-16 (×4): 10 mg via ORAL
  Administered 2012-04-16: 5 mg via ORAL
  Administered 2012-04-16 – 2012-04-20 (×14): 10 mg via ORAL
  Filled 2012-04-13 (×19): qty 2
  Filled 2012-04-13: qty 1

## 2012-04-13 MED ORDER — METOCLOPRAMIDE HCL 10 MG PO TABS: 5.0000 mg | ORAL_TABLET | Freq: Three times a day (TID) | ORAL | Status: DC | PRN

## 2012-04-13 MED ORDER — GABAPENTIN 600 MG PO TABS
600.0000 mg | ORAL_TABLET | Freq: Four times a day (QID) | ORAL | Status: DC
  Administered 2012-04-14 – 2012-04-15 (×7): 600 mg via ORAL
  Filled 2012-04-13 (×12): qty 1

## 2012-04-13 MED ORDER — FLEET ENEMA 7-19 GM/118ML RE ENEM
1.0000 | ENEMA | Freq: Once | RECTAL | Status: AC | PRN
  Filled 2012-04-13: qty 1

## 2012-04-13 MED ORDER — ONDANSETRON HCL 4 MG PO TABS: 4.0000 mg | ORAL_TABLET | Freq: Four times a day (QID) | ORAL | Status: DC | PRN

## 2012-04-13 MED ORDER — PHENOL 1.4 % MT LIQD
1.0000 | OROMUCOSAL | Status: DC | PRN
  Filled 2012-04-13: qty 177

## 2012-04-13 MED ORDER — MORPHINE SULFATE ER 30 MG PO TBCR
60.0000 mg | EXTENDED_RELEASE_TABLET | Freq: Two times a day (BID) | ORAL | Status: DC
  Administered 2012-04-14 – 2012-04-20 (×13): 60 mg via ORAL
  Filled 2012-04-13 (×10): qty 2
  Filled 2012-04-13: qty 1
  Filled 2012-04-13: qty 2
  Filled 2012-04-13: qty 1
  Filled 2012-04-13: qty 2

## 2012-04-13 MED ORDER — LACTATED RINGERS IV SOLN
INTRAVENOUS | Status: DC
  Administered 2012-04-13: 21:00:00 via INTRAVENOUS

## 2012-04-13 MED ORDER — LACTATED RINGERS IV SOLN
INTRAVENOUS | Status: DC
  Administered 2012-04-13 (×2): via INTRAVENOUS

## 2012-04-13 MED ORDER — WARFARIN SODIUM 7.5 MG PO TABS
7.5000 mg | ORAL_TABLET | Freq: Once | ORAL | Status: AC
  Administered 2012-04-13: 7.5 mg via ORAL
  Filled 2012-04-13: qty 1

## 2012-04-13 MED ORDER — DIPHENHYDRAMINE HCL 12.5 MG/5ML PO ELIX
12.5000 mg | ORAL_SOLUTION | ORAL | Status: DC | PRN
  Filled 2012-04-13: qty 10

## 2012-04-13 MED ORDER — METHOCARBAMOL 500 MG PO TABS
500.0000 mg | ORAL_TABLET | Freq: Four times a day (QID) | ORAL | Status: DC | PRN
  Administered 2012-04-14 – 2012-04-18 (×7): 500 mg via ORAL
  Filled 2012-04-13 (×9): qty 1

## 2012-04-13 MED ORDER — WARFARIN - PHARMACIST DOSING INPATIENT
Freq: Every day | Status: DC
  Administered 2012-04-17 – 2012-04-18 (×2)

## 2012-04-13 MED ORDER — FOLIC ACID 1 MG PO TABS
1.0000 mg | ORAL_TABLET | Freq: Every day | ORAL | Status: DC
  Administered 2012-04-14 – 2012-04-20 (×7): 1 mg via ORAL
  Filled 2012-04-13 (×7): qty 1

## 2012-04-13 MED ORDER — MORPHINE SULFATE 2 MG/ML IJ SOLN: 1.0000 mg | INTRAMUSCULAR | Status: DC | PRN

## 2012-04-13 MED ORDER — LIDOCAINE 4 % EX CREA
TOPICAL_CREAM | Freq: Every day | CUTANEOUS | Status: DC | PRN
  Filled 2012-04-13: qty 5

## 2012-04-13 SURGICAL SUPPLY — 61 items
BANDAGE ELASTIC 4 VELCRO ST LF (GAUZE/BANDAGES/DRESSINGS) ×3 IMPLANT
BANDAGE ESMARK 6X9 LF (GAUZE/BANDAGES/DRESSINGS) ×2 IMPLANT
BANDAGE GAUZE ELAST BULKY 4 IN (GAUZE/BANDAGES/DRESSINGS) ×4 IMPLANT
BLADE SAGITTAL 25.0X1.19X90 (BLADE) ×3 IMPLANT
BLADE SAW SGTL 13.0X1.19X90.0M (BLADE) ×2 IMPLANT
BLADE SURG ROTATE 9660 (MISCELLANEOUS) IMPLANT
BNDG CMPR 9X6 STRL LF SNTH (GAUZE/BANDAGES/DRESSINGS) ×2
BNDG CMPR MED 10X6 ELC LF (GAUZE/BANDAGES/DRESSINGS) ×2
BNDG ELASTIC 6X10 VLCR STRL LF (GAUZE/BANDAGES/DRESSINGS) ×3 IMPLANT
BNDG ESMARK 6X9 LF (GAUZE/BANDAGES/DRESSINGS) ×3
BOWL SMART MIX CTS (DISPOSABLE) ×3 IMPLANT
CEMENT HV SMART SET (Cement) ×6 IMPLANT
CLOTH BEACON ORANGE TIMEOUT ST (SAFETY) ×3 IMPLANT
COVER SURGICAL LIGHT HANDLE (MISCELLANEOUS) ×3 IMPLANT
CUFF TOURNIQUET SINGLE 34IN LL (TOURNIQUET CUFF) ×3 IMPLANT
CUFF TOURNIQUET SINGLE 44IN (TOURNIQUET CUFF) IMPLANT
DRAPE EXTREMITY T 121X128X90 (DRAPE) ×3 IMPLANT
DRAPE PROXIMA HALF (DRAPES) ×3 IMPLANT
DRAPE U-SHAPE 47X51 STRL (DRAPES) ×3 IMPLANT
DRSG ADAPTIC 3X8 NADH LF (GAUZE/BANDAGES/DRESSINGS) ×1 IMPLANT
DRSG PAD ABDOMINAL 8X10 ST (GAUZE/BANDAGES/DRESSINGS) ×3 IMPLANT
DURAPREP 26ML APPLICATOR (WOUND CARE) ×3 IMPLANT
ELECT REM PT RETURN 9FT ADLT (ELECTROSURGICAL) ×3
ELECTRODE REM PT RTRN 9FT ADLT (ELECTROSURGICAL) ×2 IMPLANT
FACESHIELD LNG OPTICON STERILE (SAFETY) ×6 IMPLANT
GLOVE BIO SURGEON STRL SZ8.5 (GLOVE) ×3 IMPLANT
GLOVE BIOGEL PI IND STRL 8 (GLOVE) ×2 IMPLANT
GLOVE BIOGEL PI IND STRL 8.5 (GLOVE) ×2 IMPLANT
GLOVE BIOGEL PI INDICATOR 8 (GLOVE) ×1
GLOVE BIOGEL PI INDICATOR 8.5 (GLOVE) ×1
GLOVE SS BIOGEL STRL SZ 8 (GLOVE) ×2 IMPLANT
GLOVE SUPERSENSE BIOGEL SZ 8 (GLOVE) ×1
GOWN PREVENTION PLUS XLARGE (GOWN DISPOSABLE) ×3 IMPLANT
GOWN PREVENTION PLUS XXLARGE (GOWN DISPOSABLE) ×3 IMPLANT
GOWN STRL NON-REIN LRG LVL3 (GOWN DISPOSABLE) ×3 IMPLANT
HANDPIECE INTERPULSE COAX TIP (DISPOSABLE) ×3
HOOD PEEL AWAY FACE SHEILD DIS (HOOD) ×3 IMPLANT
IMMOBILIZER KNEE 20 (SOFTGOODS)
IMMOBILIZER KNEE 20 THIGH 36 (SOFTGOODS) IMPLANT
IMMOBILIZER KNEE 22 UNIV (SOFTGOODS) ×3 IMPLANT
IMMOBILIZER KNEE 24 THIGH 36 (MISCELLANEOUS) IMPLANT
IMMOBILIZER KNEE 24 UNIV (MISCELLANEOUS)
KIT BASIN OR (CUSTOM PROCEDURE TRAY) ×3 IMPLANT
KIT ROOM TURNOVER OR (KITS) ×3 IMPLANT
MANIFOLD NEPTUNE II (INSTRUMENTS) ×3 IMPLANT
NS IRRIG 1000ML POUR BTL (IV SOLUTION) ×3 IMPLANT
PACK TOTAL JOINT (CUSTOM PROCEDURE TRAY) ×3 IMPLANT
PAD ARMBOARD 7.5X6 YLW CONV (MISCELLANEOUS) ×6 IMPLANT
SET HNDPC FAN SPRY TIP SCT (DISPOSABLE) ×2 IMPLANT
SPONGE GAUZE 4X4 12PLY (GAUZE/BANDAGES/DRESSINGS) ×3 IMPLANT
STAPLER VISISTAT 35W (STAPLE) ×3 IMPLANT
SUCTION FRAZIER TIP 10 FR DISP (SUCTIONS) IMPLANT
SUT VIC AB 0 CT1 27 (SUTURE) ×6
SUT VIC AB 0 CT1 27XBRD ANBCTR (SUTURE) ×4 IMPLANT
SUT VIC AB 2-0 CT1 27 (SUTURE) ×6
SUT VIC AB 2-0 CT1 TAPERPNT 27 (SUTURE) ×4 IMPLANT
SUT VLOC 180 0 24IN GS25 (SUTURE) ×3 IMPLANT
TOWEL OR 17X24 6PK STRL BLUE (TOWEL DISPOSABLE) ×3 IMPLANT
TOWEL OR 17X26 10 PK STRL BLUE (TOWEL DISPOSABLE) ×3 IMPLANT
TRAY FOLEY CATH 14FR (SET/KITS/TRAYS/PACK) ×3 IMPLANT
WATER STERILE IRR 1000ML POUR (IV SOLUTION) ×7 IMPLANT

## 2012-04-13 NOTE — Transfer of Care (Signed)
Immediate Anesthesia Transfer of Care Note  Patient: [name]  Procedure(s) Performed: Procedure(s) (LRB) with comments: TOTAL KNEE ARTHROPLASTY (Left) - with revision tibia  Patient Location: PACU  Anesthesia Type:General  Level of Consciousness: awake, sedated and confused  Airway & Oxygen Therapy: Patient Spontanous Breathing and Patient connected to face mask oxygen  Post-op Assessment: Report given to PACU RN, Post -op Vital signs reviewed and stable and Patient moving all extremities  Post vital signs: Reviewed and stable  Complications: No apparent anesthesia complications

## 2012-04-13 NOTE — Preoperative (Signed)
Beta Blockers   Reason not to administer Beta Blockers:Not Applicable 

## 2012-04-13 NOTE — Progress Notes (Signed)
ANTICOAGULATION CONSULT NOTE - Initial Consult  Pharmacy Consult for warfarin Indication: VTE prophylaxis  Allergies  Allergen Reactions  . Aspirin Hives  . Ivp Dye (Iodinated Diagnostic Agents)     "hot and sweaty and almost passed out"  . Sulfa Antibiotics Hives    Patient Measurements:   Weight: 95 kg  Vital Signs: Temp: 97.6 F (36.4 C) (01/07 1930) Temp src: Oral (01/07 1930) BP: 139/76 mmHg (01/07 1930) Pulse Rate: 74  (01/07 1930)  Labs:  Basename 04/12/12 1014  HGB 13.6  HCT 40.6  PLT 203  APTT 34  LABPROT 13.0  INR 0.99  HEPARINUNFRC --  CREATININE 0.90  CKTOTAL --  CKMB --  TROPONINI --    The CrCl is unknown because both a height and weight (above a minimum accepted value) are required for this calculation.   Medical History: Past Medical History  Diagnosis Date  . Hypertension   . Difficult intubation     Fastrach #4 LMA then # 7 ETT used in 2006 [facility], cervical laminectomy)  . Hypothyroidism   . Chronic kidney disease     HX KIDNEY STONES   . Cancer     HX RT BREAST  . Arthritis   . Cervical neuropathy     PERIPHERAL NEUROPATHY , HAD CERVICAL FUSION  . PONV (postoperative nausea and vomiting)     Medications:  Prescriptions prior to admission  Medication Sig Dispense Refill  . anastrozole (ARIMIDEX) 1 MG tablet TAKE 1 TABLET BY MOUTH DAILY  30 tablet  5  . Calcium Carbonate-Vitamin D (CALCIUM 600+D) 600-200 MG-UNIT TABS Take 1-2 tablets by mouth 2 (two) times daily. Take 1 tablet in morning and 2 tablets at night      . diclofenac sodium (VOLTAREN) 1 % GEL Apply 2 g topically 2 (two) times daily as needed. For knees      . DULoxetine (CYMBALTA) 60 MG capsule Take 60 mg by mouth daily.        . folic acid (FOLVITE) 1 MG tablet Take 1 mg by mouth daily.        . furosemide (LASIX) 40 MG tablet Take 40 mg by mouth daily.        Marland Kitchen gabapentin (NEURONTIN) 600 MG tablet Take 600 mg by mouth 4 (four) times daily.      Marland Kitchen levothyroxine  (SYNTHROID, LEVOTHROID) 125 MCG tablet Take 125 mcg by mouth daily.        Marland Kitchen lidocaine (XYLOCAINE) 5 % ointment Apply 1 application topically as needed. Using on both knees      . magnesium oxide (MAG-OX) 400 MG tablet Take 400 mg by mouth 2 (two) times daily.        . metoprolol (TOPROL-XL) 100 MG 24 hr tablet Take 100 mg by mouth daily.        Marland Kitchen morphine (MS CONTIN) 60 MG 12 hr tablet Take 60 mg by mouth 2 (two) times daily.      Marland Kitchen NIFEdipine (PROCARDIA XL/ADALAT-CC) 30 MG 24 hr tablet Take 30 mg by mouth daily.        Marland Kitchen oxyCODONE-acetaminophen (PERCOCET) 10-650 MG per tablet Take 1 tablet by mouth every 6 (six) hours as needed. For pain      . zolpidem (AMBIEN) 10 MG tablet Take 10 mg by mouth at bedtime as needed. For sleep        Assessment: 62 year old woman s/p left total knee replacement to receive warfarin for VTE prophylaxis post op. Goal of  Therapy:  INR 2-3    Plan:  Warfarin 7.5 mg x 1 dose. Daily protimes. Order warfarin video and education materials  Mickeal Skinner 04/13/2012,7:35 PM

## 2012-04-13 NOTE — Interval H&P Note (Signed)
History and Physical Interval Note:  04/13/2012 1:10 PM  [name]  has presented today for surgery, with the diagnosis of DEGENERATIVE JOINT DISEASE LEFT KNEE  The various methods of treatment have been discussed with the patient and family. After consideration of risks, benefits and other options for treatment, the patient has consented to  Procedure(s) (LRB) with comments: TOTAL KNEE ARTHROPLASTY (Left) as a surgical intervention .  The patient's history has been reviewed, patient examined, no change in status, stable for surgery.  I have reviewed the patient's chart and labs.  Questions were answered to the patient's satisfaction.     Oleva Koo G

## 2012-04-13 NOTE — Op Note (Signed)
PREOP DIAGNOSIS: DJD LEFT KNEE POSTOP DIAGNOSIS: DJD LEFT KNEE PROCEDURE: LEFT TKR ANESTHESIA: General and block ATTENDING SURGEON: Adysson Revelle G ASSISTANT: Lindwood Qua PA  INDICATIONS FOR PROCEDURE: [name] is a 62 y.o. female who has struggled for a long time with pain due to degenerative arthritis of the left knee.  The patient has failed many conservative non-operative measures and at this point has pain which limits the ability to sleep and walk.  The patient is offered total knee replacement.  Informed operative consent was obtained after discussion of possible risks of anesthesia, infection, neurovascular injury, DVT, and death.  The importance of the post-operative rehabilitation protocol to optimize result was stressed extensively with the patient.  SUMMARY OF FINDINGS AND PROCEDURE:  [name] was taken to the operative suite where under the above anesthesia a left knee replacement was performed.  There were advanced degenerative changes and the bone quality was good.  Her motion asleep was only 15-85 degrees. We used the DePuy system and placed size three RP3 femur, tibia, 35 mm all polyethylene patella, and a size RP3 10mm spacer.   The patient was admitted for appropriate post-op care to include perioperative antibiotics and mechanical and pharmacologic measures for DVT prophylaxis.  DESCRIPTION OF PROCEDURE:  [name] was taken to the operative suite where the above anesthesia was applied.  The patient was positioned supine and prepped and draped in normal sterile fashion.  An appropriate time out was performed.  After the administration of kefzol pre-op antibiotic the leg was elevated and exsanguinated and a tourniquet inflated.  A standard longitudinal incision was made on the anterior knee.  Dissection was carried down to the extensor mechanism.  All appropriate anti-infective measures were used including the pre-operative antibiotic, betadine impregnated  drape, and closed hooded exhaust systems for each member of the surgical team.  A medial parapatellar incision was made in the extensor mechanism and the knee cap flipped and the knee flexed.  Some residual meniscal tissues were removed along with any remaining ACL/PCL tissue.  A guide was placed on the tibia and a flat cut was made on it's superior surface.  An intramedullary guide was placed in the femur and was utilized to make anterior and posterior cuts creating an appropriate flexion gap.  A second intramedullary guide was placed in the femur to make a distal cut properly balancing the knee with an extension gap equal to the flexion gap.  The three bones sized to the above mentioned sizes and the appropriate guides were placed and utilized.  A trial reduction was done and the knee easily came to full extension and the patella tracked well on flexion.  The trial components were removed and all bones were cleaned with pulsatile lavage and then dried thoroughly.  Cement was mixed and was pressurized onto the bones followed by placement of the aforementioned components.  Excess cement was trimmed and pressure was held on the components until the cement had hardened.  The tourniquet was deflated and a small amount of bleeding was controlled with cautery and pressure.  The knee was irrigated thoroughly.  The extensor mechanism was re-approximated with V-loc suture in running fashion.  The knee was flexed and the repair was solid.  The subcutaneous tissues were re-approximated with #0 and #2-0 vicryl and the skin closed with staples.  A sterile dressing was applied.  Intraoperative fluids, EBL, and tourniquet time can be obtained from anesthesia records.  DISPOSITION:  The patient was taken to recovery  room in stable condition and admitted for appropriate post-op care to include peri-operative antibiotic and DVT prophylaxis with mechanical and pharmacologic measures.  Martin Smeal G 04/13/2012, 4:56 PM

## 2012-04-13 NOTE — Anesthesia Procedure Notes (Addendum)
Anesthesia Regional Block:  Femoral nerve block  Pre-Anesthetic Checklist: ,, timeout performed, Correct Patient, Correct Site, Correct Laterality, Correct Procedure, Correct Position, site marked, Risks and benefits discussed,  Surgical consent,  Pre-op evaluation,  At surgeon's request and post-op pain management  Laterality: Left  Prep: chloraprep       Needles:  Injection technique: Single-shot  Needle Type: Stimulator Needle - 40     Needle Length: 4cm  Needle Gauge: 22 and 22 G    Additional Needles:  Procedures: nerve stimulator Femoral nerve block  Nerve Stimulator or Paresthesia:  Response: patella twitch, 0.45 mA, 0.1 ms,   Additional Responses:   Narrative:  Start time: 04/13/2012 1:01 PM End time: 04/13/2012 1:17 PM Injection made incrementally with aspirations every 5 mL.  Performed by: Personally  Anesthesiologist: Sandford Craze, MD  Additional Notes: Pt identified in Holding room.  Monitors applied. Working IV access confirmed. Sterile prep L groin.  #22ga PNS to patella twitch at 0.86mA threshold.  30cc 0.5% Bupivacaine with 1:200k epi injected incrementally after negative test dose.  Patient asymptomatic, VSS, no heme aspirated, tolerated well.   Sandford Craze, MD  Femoral nerve block Procedure Name: LMA Insertion Date/Time: 04/13/2012 2:19 PM Performed by: Arlice Colt B Pre-anesthesia Checklist: Patient identified, Emergency Drugs available, Suction available, Patient being monitored and Timeout performed Patient Re-evaluated:Patient Re-evaluated prior to inductionOxygen Delivery Method: Circle system utilized Preoxygenation: Pre-oxygenation with 100% oxygen Intubation Type: IV induction LMA: LMA inserted LMA Size: 4.0 Number of attempts: 1 Placement Confirmation: positive ETCO2,  CO2 detector and breath sounds checked- equal and bilateral Tube secured with: Tape Dental Injury: Teeth and Oropharynx as per pre-operative assessment

## 2012-04-13 NOTE — Progress Notes (Signed)
CPM initiated 0-60.

## 2012-04-13 NOTE — Anesthesia Postprocedure Evaluation (Signed)
  Anesthesia Post-op Note  Patient: [name]  Procedure(s) Performed: Procedure(s) (LRB) with comments: TOTAL KNEE ARTHROPLASTY (Left) - with revision tibia  Patient Location: PACU  Anesthesia Type:GA combined with regional for post-op pain  Level of Consciousness: awake, alert , oriented and patient cooperative  Airway and Oxygen Therapy: Patient Spontanous Breathing and Patient connected to nasal cannula oxygen  Post-op Pain: mild  Post-op Assessment: Post-op Vital signs reviewed, Patient's Cardiovascular Status Stable, Respiratory Function Stable, Patent Airway, No signs of Nausea or vomiting and Pain level controlled  Post-op Vital Signs: Reviewed and stable  Complications: No apparent anesthesia complications

## 2012-04-13 NOTE — Progress Notes (Signed)
Orthopedic Tech Progress Note Patient Details:  [name] 1950/12/28 [id number]  CPM Left Knee CPM Left Knee: On Left Knee Flexion (Degrees): 60  Left Knee Extension (Degrees): 0  Additional Comments: applied overhead frame    Jennye Moccasin 04/13/2012, 7:04 PM

## 2012-04-14 LAB — CBC
HCT: 29.5 % — ABNORMAL LOW (ref 36.0–46.0)
Hemoglobin: 9.9 g/dL — ABNORMAL LOW (ref 12.0–15.0)
MCH: 34.4 pg — ABNORMAL HIGH (ref 26.0–34.0)
MCHC: 33.6 g/dL (ref 30.0–36.0)

## 2012-04-14 LAB — BASIC METABOLIC PANEL
BUN: 17 mg/dL (ref 6–23)
Calcium: 8.7 mg/dL (ref 8.4–10.5)
GFR calc non Af Amer: 59 mL/min — ABNORMAL LOW (ref >90)
Glucose, Bld: 128 mg/dL — ABNORMAL HIGH (ref 70–99)

## 2012-04-14 MED ORDER — WARFARIN SODIUM 7.5 MG PO TABS
7.5000 mg | ORAL_TABLET | Freq: Once | ORAL | Status: AC
  Administered 2012-04-14: 7.5 mg via ORAL
  Filled 2012-04-14: qty 1

## 2012-04-14 MED ORDER — ENOXAPARIN SODIUM 30 MG/0.3ML ~~LOC~~ SOLN
30.0000 mg | Freq: Two times a day (BID) | SUBCUTANEOUS | Status: DC
  Administered 2012-04-14 – 2012-04-15 (×4): 30 mg via SUBCUTANEOUS
  Filled 2012-04-14 (×8): qty 0.3

## 2012-04-14 MED ORDER — CHLORHEXIDINE GLUCONATE CLOTH 2 % EX PADS
6.0000 | MEDICATED_PAD | Freq: Every day | CUTANEOUS | Status: AC
  Administered 2012-04-14 – 2012-04-18 (×5): 6 via TOPICAL

## 2012-04-14 MED ORDER — MUPIROCIN 2 % EX OINT
1.0000 "application " | TOPICAL_OINTMENT | Freq: Two times a day (BID) | CUTANEOUS | Status: AC
  Administered 2012-04-14 – 2012-04-18 (×9): 1 via NASAL
  Filled 2012-04-14: qty 22

## 2012-04-14 NOTE — Progress Notes (Signed)
ANTICOAGULATION CONSULT NOTE - Initial Consult  Pharmacy Consult for warfarin Indication: VTE prophylaxis  Allergies  Allergen Reactions  . Ivp Dye (Iodinated Diagnostic Agents) Other (See Comments)    "hot and sweaty and almost passed out"  . Aspirin Hives  . Sulfa Antibiotics Hives    Patient Measurements: Height: 5\' 3"  (160 cm) Weight: 210 lb (95.255 kg) IBW/kg (Calculated) : 52.4  Weight: 95 kg  Vital Signs: Temp: 100.8 F (38.2 C) (01/08 0830) Temp src: Oral (01/08 0830) BP: 127/54 mmHg (01/08 0830) Pulse Rate: 109  (01/08 0830)  Labs:  Basename 04/14/12 0610 04/12/12 1014  HGB 9.9* 13.6  HCT 29.5* 40.6  PLT 156 203  APTT -- 34  LABPROT 14.7 13.0  INR 1.17 0.99  HEPARINUNFRC -- --  CREATININE 1.00 0.90  CKTOTAL -- --  CKMB -- --  TROPONINI -- --    Estimated Creatinine Clearance: 63.3 ml/min (by C-G formula based on Cr of 1).   Medical History: Past Medical History  Diagnosis Date  . Hypertension   . Difficult intubation     Fastrach #4 LMA then # 7 ETT used in 2006 [facility], cervical laminectomy)  . Hypothyroidism   . Cervical neuropathy     PERIPHERAL NEUROPATHY , HAD CERVICAL FUSION  . PONV (postoperative nausea and vomiting)   . Hypercholesteremia   . Kidney stones 1990's    "twice; passed in hospital on my own" (04/13/2012)  . Arthritis     "all over" (04/13/2012)  . Breast cancer 2011    "right" (04/13/2012)    Medications:  Prescriptions prior to admission  Medication Sig Dispense Refill  . anastrozole (ARIMIDEX) 1 MG tablet TAKE 1 TABLET BY MOUTH DAILY  30 tablet  5  . Calcium Carbonate-Vitamin D (CALCIUM 600+D) 600-200 MG-UNIT TABS Take 1-2 tablets by mouth 2 (two) times daily. Take 1 tablet in morning and 2 tablets at night      . diclofenac sodium (VOLTAREN) 1 % GEL Apply 2 g topically 2 (two) times daily as needed. For knees      . DULoxetine (CYMBALTA) 60 MG capsule Take 60 mg by mouth daily.        . folic acid (FOLVITE) 1 MG tablet  Take 1 mg by mouth daily.        . furosemide (LASIX) 40 MG tablet Take 40 mg by mouth daily.        Marland Kitchen gabapentin (NEURONTIN) 600 MG tablet Take 600 mg by mouth 4 (four) times daily.      Marland Kitchen levothyroxine (SYNTHROID, LEVOTHROID) 125 MCG tablet Take 125 mcg by mouth daily.        Marland Kitchen lidocaine (XYLOCAINE) 5 % ointment Apply 1 application topically as needed. Using on both knees      . magnesium oxide (MAG-OX) 400 MG tablet Take 400 mg by mouth 2 (two) times daily.        . metoprolol (TOPROL-XL) 100 MG 24 hr tablet Take 100 mg by mouth daily.        Marland Kitchen morphine (MS CONTIN) 60 MG 12 hr tablet Take 60 mg by mouth 2 (two) times daily.      Marland Kitchen NIFEdipine (PROCARDIA XL/ADALAT-CC) 30 MG 24 hr tablet Take 30 mg by mouth daily.        Marland Kitchen oxyCODONE-acetaminophen (PERCOCET) 10-650 MG per tablet Take 1 tablet by mouth every 6 (six) hours as needed. For pain      . zolpidem (AMBIEN) 10 MG tablet Take 10 mg by  mouth at bedtime as needed. For sleep        Assessment: 62 year old woman s/p left total knee replacement to receive warfarin for VTE prophylaxis post op. Warfarin points: 4.  Goal of Therapy:  INR 2-3   Plan:  - Warfarin 7.5 mg x 1 - Daily INR  [name] L. Illene Bolus, PharmD, BCPS Clinical Pharmacist Pager: [phone] Pharmacy: [phone] 04/14/2012 9:38 AM

## 2012-04-14 NOTE — Clinical Social Work Placement (Signed)
     Clinical Social Work Department CLINICAL SOCIAL WORK PLACEMENT NOTE 04/14/2012  Patient:  [name],[name]  Account Number:  [id number] Admit date:  04/13/2012  Clinical Social Worker:  Hulan Fray  Date/time:  04/14/2012 12:16 PM  Clinical Social Work is seeking post-discharge placement for this patient at the following level of care:   SKILLED NURSING   (*CSW will update this form in Epic as items are completed)   04/14/2012  Patient/family provided with [facility] Department of Clinical Social Works list of facilities offering this level of care within the geographic area requested by the patient (or if unable, by the patients family).  04/14/2012  Patient/family informed of their freedom to choose among providers that offer the needed level of care, that participate in Medicare, Medicaid or managed care program needed by the patient, have an available bed and are willing to accept the patient.  04/14/2012  Patient/family informed of [facility] ownership interest in [facility], as well as of the fact that they are under no obligation to receive care at this facility.  PASARR submitted to [employer] on 04/14/2012 PASARR number received from [employer] on 04/14/2012  FL2 transmitted to all facilities in geographic area requested by pt/family on  04/14/2012 FL2 transmitted to all facilities within larger geographic area on   Patient informed that his/her managed care company has contracts with or will negotiate with  certain facilities, including the following:     Patient/family informed of bed offers received:   Patient chooses bed at  Physician recommends and patient chooses bed at    Patient to be transferred to  on   Patient to be transferred to facility by   The following physician request were entered in Epic:   Additional Comments:

## 2012-04-14 NOTE — Progress Notes (Signed)
Patient has not voided for over 8 hours and is unable to currently void. [place], PA notified new orders received to place foley catheter. Will continue to monitor patient.

## 2012-04-14 NOTE — Progress Notes (Signed)
Occupational Therapy Evaluation Patient Details Name: [name] MRN: [id number] DOB: 07-02-1950 Today's Date: 04/14/2012 Time: 4098-1191 OT Time Calculation (min): 1437 min  OT Assessment / Plan / Recommendation Clinical Impression  62 yo s/p L TKA. Pt with hx of cervical spondylosis with fusion and breast CA. PTA, pt livied @ mod I level with occasional min A from husband with ADL and mobility. Pt primarily ambulated short distance in and out of the house with RW/rollator. Pt states that @ 1 month ago she began dragging her LLE and had more difficulty with balance. Husband contacted via phone and confirmed - see PT note. While siitng EOB, pt required Max A for static sitting balance due to strong R lateral lean. Pt unable to sit unsupported. Husband states this is not her norm. Discussed concerns with PA, Bryna Colander. When medically stable, pt will be an excellent rehab candidate. Rec CIR consult. Pt will benefit from skilled Ot services to max independence with ADL and functional moiblity for ADL to facilitate D/C to next venue of care.    OT Assessment  Patient needs continued OT Services    Follow Up Recommendations  CIR    Barriers to Discharge None    Equipment Recommendations  Wheelchair (measurements OT);Wheelchair cushion (measurements OT)    Recommendations for Other Services Rehab consult  Frequency  Min 3X/week    Precautions / Restrictions Precautions Precautions: Fall Precaution Comments: concerning neurological symptoms (contacted PA) Restrictions Weight Bearing Restrictions: Yes LLE Weight Bearing: Weight bearing as tolerated   Pertinent Vitals/Pain 4. BP 146/95 nsg notified. HR 115 Pt reclined    ADL  Eating/Feeding: Modified independent Where Assessed - Eating/Feeding: Bed level Grooming: Minimal assistance Where Assessed - Grooming: Supported sitting Upper Body Bathing: Minimal assistance Where Assessed - Upper Body Bathing: Supported sitting Lower  Body Bathing: +1 Total assistance Where Assessed - Lower Body Bathing: Supine, head of bed up;Rolling right and/or left Upper Body Dressing: Moderate assistance Where Assessed - Upper Body Dressing: Supported sitting Lower Body Dressing: +1 Total assistance Where Assessed - Lower Body Dressing: Supine, head of bed up;Rolling right and/or left Toilet Transfer: +2 Total assistance;Other (comment) (attempted but pt unable to stand) Equipment Used: Gait belt;Rolling walker Transfers/Ambulation Related to ADLs: Attempted +2 total A ADL Comments: total A with LB ADL. Maybenefit from AE    OT Diagnosis: Generalized weakness;Acute pain;Other (comment);Ataxia (poor postural control)  OT Problem List: Decreased strength;Decreased range of motion;Decreased activity tolerance;Impaired balance (sitting and/or standing);Decreased cognition;Decreased safety awareness;Decreased knowledge of use of DME or AE;Obesity;Pain OT Treatment Interventions: Self-care/ADL training;Therapeutic exercise;Neuromuscular education;Energy conservation;DME and/or AE instruction;Therapeutic activities;Cognitive remediation/compensation;Patient/family education;Balance training   OT Goals Acute Rehab OT Goals OT Goal Formulation: With patient Time For Goal Achievement: 04/28/12 Potential to Achieve Goals: Good ADL Goals Pt Will Perform Upper Body Bathing: with set-up;with supervision;Supported;Sitting, chair ADL Goal: Upper Body Bathing - Progress: Goal set today Pt Will Transfer to Toilet: with mod assist;Squat pivot transfer;with DME;3-in-1;with cueing (comment type and amount) ADL Goal: Toilet Transfer - Progress: Goal set today Pt Will Perform Toileting - Clothing Manipulation: with mod assist;Sitting on 3-in-1 or toilet;Rolling right and/or left ADL Goal: Toileting - Clothing Manipulation - Progress: Goal set today Pt Will Perform Toileting - Hygiene: with mod assist;Leaning right and/or left on 3-in-1/toilet;with cueing  (comment type and amount) ADL Goal: Toileting - Hygiene - Progress: Goal set today Additional ADL Goal #1: Maintain upright posture EOB X 10 min  with min A in preparation for ADL retarining. ADL Goal:  Additional Goal #1 - Progress: Goal set today Additional ADL Goal #2: Complete bed mobility with min A in preparation for ADL. ADL Goal: Additional Goal #2 - Progress: Goal set today  Visit Information  Last OT Received On: 04/14/12 Assistance Needed: +2 PT/OT Co-Evaluation/Treatment: Yes    Subjective Data      Prior Functioning     Home Living Lives With: Spouse Available Help at Discharge: Family Type of Home: House Home Access: Stairs to enter Secretary/administrator of Steps: 2 Entrance Stairs-Rails: Right Home Layout: One level Bathroom Shower/Tub: Engineer, manufacturing systems: Standard Bathroom Accessibility: Yes How Accessible: Accessible via walker Home Adaptive Equipment: Bedside commode/3-in-1;Walker - rolling;Walker - four wheeled Prior Function Level of Independence: Needs assistance;Independent with assistive device(s) (using rw) Needs Assistance: Bathing Bath: Minimal Able to Take Stairs?: Yes Driving: No Vocation: Unemployed Communication Communication: No difficulties Dominant Hand: Right         Vision/Perception Vision - Assessment Eye Alignment: Within Functional Limits Vision Assessment: Vision impaired - to be further tested in functional context Additional Comments: decreased smoothmess of tracking and saccades Perception Perception: Within Functional Limits Praxis Praxis: Intact   Cognition  Overall Cognitive Status: Impaired Area of Impairment: Safety/judgement;Awareness of errors;Awareness of deficits;Problem solving;Executive functioning Arousal/Alertness: Other (comment) (alert but unable to keep eyes open throughout session. dropp) Orientation Level: Appears intact for tasks assessed;Oriented X4 / Intact Behavior During  Session: [facility] for tasks performed Safety/Judgement: Decreased awareness of safety precautions;Decreased safety judgement for tasks assessed;Decreased awareness of need for assistance Safety/Judgement - Other Comments: pt unaware of body positioning; patient unable to recognize amount of assist being provided or required Awareness of Errors: Assistance required to identify errors made;Assistance required to correct errors made Awareness of Errors - Other Comments: unaware of leaning/falling to R and posteriorly Awareness of Deficits: poor awareness of deficits. emergent awareness Problem Solving: mod a functional basic Executive Functioning: decreased reasoning and decreased self insight Cognition - Other Comments: ? baseline functioning    Extremity/Trunk Assessment Right Upper Extremity Assessment RUE ROM/Strength/Tone: Deficits RUE ROM/Strength/Tone Deficits: AROM WFL in supine. Unable to test in sitting due to not being able to lift R hand from bed without falling to R.. abnormal tone. appears to demonstrate ataxia B RUE Sensation: History of peripheral neuropathy RUE Coordination: Deficits RUE Coordination Deficits: due to spondylosis? Left Upper Extremity Assessment LUE ROM/Strength/Tone: Deficits LUE ROM/Strength/Tone Deficits: AROM WNL supine. ?ataxia. abnormal tone LUE Sensation: Deficits;History of peripheral neuropathy LUE Sensation Deficits: spondylosis LUE Coordination: Deficits LUE Coordination Deficits: increased time Right Lower Extremity Assessment RLE ROM/Strength/Tone: Deficits;Unable to fully assess RLE ROM/Strength/Tone Deficits: strength in isolation appears [facility] with testings but unable to support weight bearing activity RLE Sensation: Deficits;History of peripheral neuropathy RLE Sensation Deficits: could not recognize foot placement Left Lower Extremity Assessment LLE ROM/Strength/Tone: Deficits;Unable to fully assess;Due to pain;Due to precautions LLE  ROM/Strength/Tone Deficits: reports dragging leg for last month LLE Sensation: Deficits;History of peripheral neuropathy Trunk Assessment Trunk Assessment: Other exceptions Trunk Exceptions: unable to maintain midline position; significant truncal ataxia noted (R bias)     Mobility Bed Mobility Bed Mobility: Rolling Right;Rolling Left;Supine to Sit;Sitting - Scoot to [place] of Bed;Sit to Supine;Scooting to [facility] Rolling Right: 3: Mod assist Rolling Left: 3: Mod assist Supine to Sit: 1: +2 Total assist Supine to Sit: Patient Percentage: 20% Sitting - Scoot to Edge of Bed: 1: +2 Total assist Sitting - Scoot to Edge of Bed: Patient Percentage: 10% Sit to Supine: 1: +2 Total assist Sit  to Supine: Patient Percentage: 0% Scooting to HOB: 1: +2 Total assist Scooting to [facility]: Patient Percentage: 0% Details for Bed Mobility Assistance: Max support and manual assist required at all times despite max VC's see balance for additional details Transfers Sit to Stand:  (attempted but unable to complete despite max +2 total assist) Sit to Stand: Patient Percentage: 10%               Balance Balance Balance Assessed: Yes Static Sitting Balance Static Sitting - Balance Support: Feet supported;Bilateral upper extremity supported Static Sitting - Level of Assistance: 2: Max assist;Other (comment) Static Sitting - Comment/# of Minutes: 15 min (R lat and posterior lean. REquired Max A to support)   End of Session OT - End of Session Equipment Utilized During Treatment: Gait belt Activity Tolerance: Treatment limited secondary to medical complications (Comment);Other (comment) (limited due to apparent neurological symptoms. new vs chroni) Patient left: in bed;with call bell/phone within reach;with bed alarm set Nurse Communication: Mobility status;Precautions;Other (comment) (concerns over current nedical status)  GO     Etherine Mackowiak,HILLARY 04/14/2012, 4:49 PM [facility], OTR/L  [phone] 04/14/2012

## 2012-04-14 NOTE — Evaluation (Signed)
Physical Therapy Evaluation Patient Details Name: [name] MRN: [id number] DOB: 03-06-1951 Today's Date: 04/14/2012 Time: 6213-0865 PT Time Calculation (min): 57 min  PT Assessment / Plan / Recommendation Clinical Impression  Pt is a pleasent and cooperative 62 y.o. female gihly motivated to participate in therapy.  Pt demonstrates signficant functional deficits as indicated throughout assessment.  Pt limited by weakness, decreased ROM, decreased activity tolerance, poor coordination and cognitive deficits.  Pt would benefit from continued skilled PT in an intense therapeutic setting to address deficits and maximize functional mobility.  Spoke with husband regarding baseline PLOF during session via phone; husband, he states that at times patient would have need for some assist with transfers and some instability in sitting but has not previously seen any pushing to the right or significant coordination deficits.  Will continue to monitor patients progress in regards to these deficits noted today; nsg and PA made aware of therapy findings.  Please see OT treatment note for further details.      PT Assessment  Patient needs continued PT services    Follow Up Recommendations  CIR    Does the patient have the potential to tolerate intense rehabilitation      Barriers to Discharge        Equipment Recommendations       Recommendations for Other Services Rehab consult;Other (comment) (Medical consult per discussion with PA)   Frequency Min 5X/week    Precautions / Restrictions Precautions Precautions: Fall Precaution Comments: concerning neurological symptoms (contacted PA) Restrictions Weight Bearing Restrictions: Yes LLE Weight Bearing: Weight bearing as tolerated   Pertinent Vitals/Pain 4/10 Left knee; reports needing "bengay" for feeling in arm and reports headache since this morning; nsg aware      Mobility  Bed Mobility Bed Mobility: Rolling Right;Rolling Left;Supine to  Sit;Sitting - Scoot to [place] of Bed;Sit to Supine;Scooting to [facility] Rolling Right: 3: Mod assist Rolling Left: 3: Mod assist Supine to Sit: 1: +2 Total assist Supine to Sit: Patient Percentage: 20% Sitting - Scoot to Edge of Bed: 1: +2 Total assist Sitting - Scoot to Edge of Bed: Patient Percentage: 10% Sit to Supine: 1: +2 Total assist Sit to Supine: Patient Percentage: 0% Scooting to HOB: 1: +2 Total assist Scooting to [facility]: Patient Percentage: 0% Details for Bed Mobility Assistance: Max support and manual assist required at all times despite max VC's see balance for additional details Transfers Transfers: Sit to Stand Sit to Stand: 1: +2 Total assist (attempted but unable to complete despite max +2 total assist) Sit to Stand: Patient Percentage: 10% Ambulation/Gait Ambulation/Gait Assistance: Not tested (comment)    Shoulder Instructions     Exercises     PT Diagnosis:    PT Problem List: Decreased strength;Decreased range of motion;Decreased activity tolerance;Decreased balance;Decreased mobility;Decreased coordination;Decreased cognition;Decreased safety awareness;Pain PT Treatment Interventions: DME instruction;Gait training;Functional mobility training;Therapeutic activities;Therapeutic exercise;Balance training;Patient/family education   PT Goals Acute Rehab PT Goals PT Goal Formulation: With patient Time For Goal Achievement: 04/21/12 Potential to Achieve Goals: Fair Pt will Sit at [facility] of Bed: with min assist PT Goal: Sit at Edge Of Bed - Progress: Goal set today Pt will go Sit to Stand: with min assist PT Goal: Sit to Stand - Progress: Goal set today Pt will go Stand to Sit: with min assist PT Goal: Stand to Sit - Progress: Goal set today Pt will Ambulate: 16 - 50 feet;with min assist;with rolling walker PT Goal: Ambulate - Progress: Goal set today  Visit Information  Last PT Received On: 04/14/12 Assistance Needed: +2 PT/OT Co-Evaluation/Treatment: Yes      Subjective Data  Subjective: I think ill be able to walk Patient Stated Goal: to walk again   Prior Functioning  Home Living Lives With: Spouse Available Help at Discharge: Family Type of Home: House Home Access: Stairs to enter Secretary/administrator of Steps: 2 Entrance Stairs-Rails: Right Home Layout: One level Bathroom Shower/Tub: Engineer, manufacturing systems: Standard Bathroom Accessibility: Yes How Accessible: Accessible via walker Home Adaptive Equipment: Bedside commode/3-in-1;Walker - rolling;Walker - four wheeled Prior Function Level of Independence: Needs assistance;Independent with assistive device(s) (using rw) Needs Assistance: Bathing Bath: Minimal Able to Take Stairs?: Yes Driving: No Vocation: Unemployed Communication Communication: No difficulties Dominant Hand: Right    Cognition  Overall Cognitive Status: Impaired Area of Impairment: Safety/judgement;Awareness of errors;Awareness of deficits;Problem solving;Executive functioning Arousal/Alertness: Other (comment) (alert but unable to keep eyes open throughout session. dropp) Orientation Level: Appears intact for tasks assessed;Oriented X4 / Intact Behavior During Session: [facility] for tasks performed Safety/Judgement: Decreased awareness of safety precautions;Decreased safety judgement for tasks assessed;Decreased awareness of need for assistance Safety/Judgement - Other Comments: pt unaware of body positioning; patient unable to recognize amount of assist being provided or required Awareness of Errors: Assistance required to identify errors made;Assistance required to correct errors made Awareness of Errors - Other Comments: unaware of leaning/falling to R and posteriorly Awareness of Deficits: poor awareness of deficits. emergent awareness Problem Solving: mod a functional basic Executive Functioning: decreased reasoning and decreased self insight Cognition - Other Comments: ? baseline functioning     Extremity/Trunk Assessment Right Upper Extremity Assessment RUE ROM/Strength/Tone: Deficits RUE ROM/Strength/Tone Deficits: AROM WFL in supine. Unable to test in sitting due to not being able to lift R hand from bed without falling to R.. abnormal tone. appears to demonstrate ataxia B RUE Sensation: History of peripheral neuropathy RUE Coordination: Deficits RUE Coordination Deficits: due to spondylosis? Left Upper Extremity Assessment LUE ROM/Strength/Tone: Deficits LUE ROM/Strength/Tone Deficits: AROM WNL supine. ?ataxia. abnormal tone LUE Sensation: Deficits;History of peripheral neuropathy LUE Sensation Deficits: spondylosis LUE Coordination: Deficits LUE Coordination Deficits: increased time Right Lower Extremity Assessment RLE ROM/Strength/Tone: Deficits;Unable to fully assess RLE ROM/Strength/Tone Deficits: strength in isolation appears [facility] with testings but unable to support weight bearing activity RLE Sensation: Deficits;History of peripheral neuropathy RLE Sensation Deficits: could not recognize foot placement Left Lower Extremity Assessment LLE ROM/Strength/Tone: Deficits;Unable to fully assess;Due to pain;Due to precautions LLE ROM/Strength/Tone Deficits: reports dragging leg for last month LLE Sensation: Deficits;History of peripheral neuropathy Trunk Assessment Trunk Assessment: Other exceptions Trunk Exceptions: unable to maintain midline position; significant truncal ataxia noted (R bias)   Balance Balance Balance Assessed: Yes Static Sitting Balance Static Sitting - Balance Support: Feet supported;Bilateral upper extremity supported Static Sitting - Level of Assistance: 2: Max assist;Other (comment) (patient unable to maintain midline; significant push to R ) Static Sitting - Comment/# of Minutes: Pt with very poor trunk control and significant truncal ataxia.  Pt required max support and presented with push to right and backward lean.  Patient continuously unaware of  body position stating I am sitting forward.  Max manual cues to keep LE feet on floor  End of Session PT - End of Session Equipment Utilized During Treatment: Gait belt Activity Tolerance: Patient limited by fatigue (patient continuously lethargic but easily arousable) Patient left: in bed;with call bell/phone within reach Nurse Communication: Mobility status (activity concerns; pt reports headache; and elevated BPs )  GP  Fabio Asa 04/14/2012, 4:23 PM Charlotte Crumb, PT DPT  [phone]

## 2012-04-14 NOTE — Clinical Social Work Psychosocial (Signed)
     Clinical Social Work Department BRIEF PSYCHOSOCIAL ASSESSMENT 04/14/2012  Patient:  [name],[name]     Account Number:  [id number]     Admit date:  04/13/2012  Clinical Social Worker:  Hulan Fray  Date/Time:  04/14/2012 11:17 AM  Referred by:  Physician  Date Referred:  04/13/2012 Referred for  SNF Placement   Other Referral:   Interview type:  Patient Other interview type:   Husband- Corlis Leak    PSYCHOSOCIAL DATA Living Status:  HUSBAND Admitted from facility:   Level of care:   Primary support name:  Corlis Leak Primary support relationship to patient:  SPOUSE Degree of support available:   supportive    CURRENT CONCERNS Current Concerns  Post-Acute Placement   Other Concerns:    SOCIAL WORK ASSESSMENT / PLAN Clinical Social Worker received referral for SNF placement at discharge. CSW introduced self and explained reason for visit. Patient's husband was at bedside. Patient and husband are agreeable for this discharge plan. Patient preferred [facility] or [facility] SNF's but they are not contracted with their insurance. Patient gave permission for CSW to initiate a broad SNF search in [place]. CSW will update patient and family when bed offers are made. CSW will complete FL2 for MD's signature and continue to follow.   Assessment/plan status:  Psychosocial Support/Ongoing Assessment of Needs Other assessment/ plan:   Information/referral to community resources:   SNF packet    PATIENTS/FAMILYS RESPONSE TO PLAN OF CARE: Patient and husband are both agreeable to SNF placement at discharge. Patient was appreciative of CSW's visit and assistance with placement process.

## 2012-04-14 NOTE — Progress Notes (Signed)
Subjective: 1 Day Post-Op Procedure(s) (LRB): TOTAL KNEE ARTHROPLASTY (Left)  Activity level:  WBAT l tkr Diet tolerance:  eating Voiding:  Foley coming out today Patient reports pain as 4 on 0-10 scale.    Objective: Vital signs in last 24 hours: Temp:  [96.8 F (36 C)-100.3 F (37.9 C)] 100.3 F (37.9 C) (01/08 0600) Pulse Rate:  [74-117] 117  (01/08 0600) Resp:  [12-20] 20  (01/08 0600) BP: (112-150)/(57-76) 135/58 mmHg (01/08 0600) SpO2:  [96 %-100 %] 98 % (01/08 0600) Weight:  [95.255 kg (210 lb)] 95.255 kg (210 lb) (01/08 0600)  Labs:  Basename 04/14/12 0610 04/12/12 1014  HGB 9.9* 13.6    Basename 04/14/12 0610 04/12/12 1014  WBC 6.2 4.8  RBC 2.88* 3.96  HCT 29.5* 40.6  PLT 156 203    Basename 04/14/12 0610 04/12/12 1014  NA 140 142  K 4.3 4.6  CL 103 104  CO2 29 26  BUN 17 16  CREATININE 1.00 0.90  GLUCOSE 128* 123*  CALCIUM 8.7 9.8    Basename 04/14/12 0610 04/12/12 1014  LABPT -- --  INR 1.17 0.99    Physical Exam:  Neurologically intact ABD soft Neurovascular intact Sensation intact distally Intact pulses distally Dorsiflexion/Plantar flexion intact Incision: scant drainage No cellulitis present Compartment soft  Assessment/Plan:  1 Day Post-Op Procedure(s) (LRB): TOTAL KNEE ARTHROPLASTY (Left) Advance diet Up with therapy D/C IV fluids Discharge to SNF in a day or two Will watch H and H Lovenox and coumadin x 2 weeks    [name] R 04/14/2012, 8:16 AM

## 2012-04-14 NOTE — Progress Notes (Signed)
While setting up to place foley catheter as ordered for urinary retention patient had an incontinent episode of a moderate to large amount of urine. Foley catheter not placed at this time. Will continue to monitor patient urinary output.

## 2012-04-15 ENCOUNTER — Inpatient Hospital Stay (HOSPITAL_COMMUNITY): Payer: Medicare HMO

## 2012-04-15 ENCOUNTER — Encounter (HOSPITAL_COMMUNITY): Payer: Self-pay | Admitting: Orthopaedic Surgery

## 2012-04-15 DIAGNOSIS — R410 Disorientation, unspecified: Secondary | ICD-10-CM | POA: Diagnosis present

## 2012-04-15 DIAGNOSIS — R000 Tachycardia, unspecified: Secondary | ICD-10-CM

## 2012-04-15 DIAGNOSIS — R404 Transient alteration of awareness: Secondary | ICD-10-CM

## 2012-04-15 DIAGNOSIS — I10 Essential (primary) hypertension: Secondary | ICD-10-CM | POA: Diagnosis present

## 2012-04-15 DIAGNOSIS — N179 Acute kidney failure, unspecified: Secondary | ICD-10-CM | POA: Diagnosis present

## 2012-04-15 LAB — URINALYSIS, ROUTINE W REFLEX MICROSCOPIC
Glucose, UA: NEGATIVE mg/dL
Ketones, ur: NEGATIVE mg/dL
Leukocytes, UA: NEGATIVE
Nitrite: NEGATIVE
Specific Gravity, Urine: 1.024 (ref 1.005–1.030)
pH: 6 (ref 5.0–8.0)

## 2012-04-15 LAB — BASIC METABOLIC PANEL
CO2: 27 mEq/L (ref 19–32)
Chloride: 102 mEq/L (ref 96–112)
Creatinine, Ser: 1.17 mg/dL — ABNORMAL HIGH (ref 0.50–1.10)
Potassium: 3.9 mEq/L (ref 3.5–5.1)

## 2012-04-15 LAB — URINE MICROSCOPIC-ADD ON

## 2012-04-15 LAB — CBC
HCT: 31.2 % — ABNORMAL LOW (ref 36.0–46.0)
MCH: 33.6 pg (ref 26.0–34.0)
MCV: 103.7 fL — ABNORMAL HIGH (ref 78.0–100.0)
RDW: 15 % (ref 11.5–15.5)
WBC: 8.1 10*3/uL (ref 4.0–10.5)

## 2012-04-15 MED ORDER — ENOXAPARIN SODIUM 30 MG/0.3ML ~~LOC~~ SOLN: 30.0000 mg | Freq: Two times a day (BID) | SUBCUTANEOUS | Status: DC

## 2012-04-15 MED ORDER — METHOCARBAMOL 500 MG PO TABS: 500.0000 mg | ORAL_TABLET | Freq: Four times a day (QID) | ORAL | Status: DC | PRN

## 2012-04-15 MED ORDER — CHLORHEXIDINE GLUCONATE CLOTH 2 % EX PADS: 6.0000 | MEDICATED_PAD | Freq: Every day | CUTANEOUS | Status: DC

## 2012-04-15 MED ORDER — HALOPERIDOL LACTATE 5 MG/ML IJ SOLN: 1.0000 mg | Freq: Four times a day (QID) | INTRAMUSCULAR | Status: DC | PRN

## 2012-04-15 MED ORDER — WARFARIN SODIUM 7.5 MG PO TABS
7.5000 mg | ORAL_TABLET | Freq: Once | ORAL | Status: AC
  Administered 2012-04-15: 7.5 mg via ORAL
  Filled 2012-04-15: qty 1

## 2012-04-15 MED ORDER — MORPHINE SULFATE ER 60 MG PO TBCR: 60.0000 mg | EXTENDED_RELEASE_TABLET | Freq: Two times a day (BID) | ORAL | Status: DC

## 2012-04-15 MED ORDER — MUPIROCIN 2 % EX OINT: 1.0000 "application " | TOPICAL_OINTMENT | Freq: Two times a day (BID) | CUTANEOUS | Status: DC

## 2012-04-15 MED ORDER — OXYCODONE-ACETAMINOPHEN 10-650 MG PO TABS: 1.0000 | ORAL_TABLET | Freq: Four times a day (QID) | ORAL | Status: DC | PRN

## 2012-04-15 MED ORDER — SODIUM CHLORIDE 0.9 % IV SOLN
INTRAVENOUS | Status: DC
  Administered 2012-04-17 – 2012-04-18 (×3): via INTRAVENOUS

## 2012-04-15 MED ORDER — WARFARIN SODIUM 5 MG PO TABS: ORAL_TABLET | ORAL | Status: DC

## 2012-04-15 NOTE — Progress Notes (Addendum)
ANTICOAGULATION CONSULT NOTE - Follow Up Consult  Pharmacy Consult for Coumadin Indication: VTE prophylaxis  Allergies  Allergen Reactions  . Ivp Dye (Iodinated Diagnostic Agents) Other (See Comments)    "hot and sweaty and almost passed out"  . Aspirin Hives  . Sulfa Antibiotics Hives    Patient Measurements: Height: 5\' 3"  (160 cm) Weight: 210 lb (95.255 kg) IBW/kg (Calculated) : 52.4   Vital Signs: Temp: 98.9 F (37.2 C) (01/09 0709) Temp src: Oral (01/09 0709) BP: 119/47 mmHg (01/09 0709) Pulse Rate: 114  (01/09 0709)  Labs:  Basename 04/15/12 0515 04/14/12 0610  HGB 10.1* 9.9*  HCT 31.2* 29.5*  PLT 155 156  APTT -- --  LABPROT 18.0* 14.7  INR 1.54* 1.17  HEPARINUNFRC -- --  CREATININE -- 1.00  CKTOTAL -- --  CKMB -- --  TROPONINI -- --    Estimated Creatinine Clearance: 63.3 ml/min (by C-G formula based on Cr of 1).   Medications:  Prescriptions prior to admission  Medication Sig Dispense Refill  . anastrozole (ARIMIDEX) 1 MG tablet TAKE 1 TABLET BY MOUTH DAILY  30 tablet  5  . Calcium Carbonate-Vitamin D (CALCIUM 600+D) 600-200 MG-UNIT TABS Take 1-2 tablets by mouth 2 (two) times daily. Take 1 tablet in morning and 2 tablets at night      . diclofenac sodium (VOLTAREN) 1 % GEL Apply 2 g topically 2 (two) times daily as needed. For knees      . DULoxetine (CYMBALTA) 60 MG capsule Take 60 mg by mouth daily.        . folic acid (FOLVITE) 1 MG tablet Take 1 mg by mouth daily.        . furosemide (LASIX) 40 MG tablet Take 40 mg by mouth daily.        Marland Kitchen gabapentin (NEURONTIN) 600 MG tablet Take 600 mg by mouth 4 (four) times daily.      Marland Kitchen levothyroxine (SYNTHROID, LEVOTHROID) 125 MCG tablet Take 125 mcg by mouth daily.        Marland Kitchen lidocaine (XYLOCAINE) 5 % ointment Apply 1 application topically as needed. Using on both knees      . magnesium oxide (MAG-OX) 400 MG tablet Take 400 mg by mouth 2 (two) times daily.        . metoprolol (TOPROL-XL) 100 MG 24 hr tablet  Take 100 mg by mouth daily.        Marland Kitchen morphine (MS CONTIN) 60 MG 12 hr tablet Take 60 mg by mouth 2 (two) times daily.      Marland Kitchen NIFEdipine (PROCARDIA XL/ADALAT-CC) 30 MG 24 hr tablet Take 30 mg by mouth daily.        Marland Kitchen oxyCODONE-acetaminophen (PERCOCET) 10-650 MG per tablet Take 1 tablet by mouth every 6 (six) hours as needed. For pain      . zolpidem (AMBIEN) 10 MG tablet Take 10 mg by mouth at bedtime as needed. For sleep        Assessment: 62 year old woman s/p left total knee replacement to receive warfarin for VTE prophylaxis post op.  Warfarin points: 4. INR is sub-therapeutic, but steadily trending up towards goal. Patient is on Lovenox 30mg  Sq q 12h until INR reaches 1.8 No bleeding complications noted, H/H and plts are stable. Noted Coumadin education done and documented in [facility], 04/14/12.  Goal of Therapy:  INR 2-3 Monitor platelets by anticoagulation protocol: Yes   Plan:  - Coumadin 7.5mg  PO x 1 again today - Will f/up daily  INR  Thanks, Reaghan Kawa K. Allena Katz, PharmD, BCPS.  Clinical Pharmacist Pager [phone]. 04/15/2012 10:33 AM

## 2012-04-15 NOTE — Progress Notes (Signed)
Subjective: 2 Days Post-Op Procedure(s) (LRB): TOTAL KNEE ARTHROPLASTY WITH REVISION COMPONENTS (Left)  Activity level:  OOB with PT a bit Diet tolerance:  regular Voiding:  well Patient reports pain as mild.    Objective: Vital signs in last 24 hours: Temp:  [98.8 F (37.1 C)-100 F (37.8 C)] 98.9 F (37.2 C) (01/09 0709) Pulse Rate:  [58-114] 114  (01/09 0709) Resp:  [16-20] 18  (01/09 0709) BP: (103-134)/(47-64) 119/47 mmHg (01/09 0709) SpO2:  [93 %-100 %] 93 % (01/09 0709)  Labs:  Basename 04/15/12 0515 04/14/12 0610  HGB 10.1* 9.9*    Basename 04/15/12 0515 04/14/12 0610  WBC 8.1 6.2  RBC 3.01* 2.88*  HCT 31.2* 29.5*  PLT 155 156    Basename 04/14/12 0610  NA 140  K 4.3  CL 103  CO2 29  BUN 17  CREATININE 1.00  GLUCOSE 128*  CALCIUM 8.7    Basename 04/15/12 0515 04/14/12 0610  LABPT -- --  INR 1.54* 1.17    Physical Exam:  ABD soft Neurovascular intact Sensation intact distally Intact pulses distally Dorsiflexion/Plantar flexion intact Incision: dressing C/D/I Compartment soft  Good equal strength is UEs  Assessment/Plan:  2 Days Post-Op Procedure(s) (LRB): TOTAL KNEE ARTHROPLASTY WITH REVISION COMPONENTS (Left)  Neuro exam nonfocal Called hospitalist consult yesterday but they have not yet been able to see her and at this point really not necessary  Discharge to SNF possibly tomorrow Needs placement as deconditioned and s/p TKR    [name] 04/15/2012, 11:40 AM

## 2012-04-15 NOTE — Clinical Social Work Note (Signed)
Clinical Social Worker initiated English as a second language teacher process for SNF placement.   Rozetta Nunnery MSW, [employer] [phone]

## 2012-04-15 NOTE — Clinical Social Work Note (Signed)
Clinical Social Worker provided list of bed offers to patient and her son, who was at bedside. Son reported that he will pass the information along to his father. CSW will continue to follow.   Rozetta Nunnery MSW, [employer] [phone]

## 2012-04-15 NOTE — Progress Notes (Addendum)
Rehab Admissions Coordinator Note:  Patient was screened by Trish Mage for appropriateness for an Inpatient Acute Rehab Consult.  At this time, we are recommending Skilled Nursing Facility.  Patient has [employer].  They will not approve an acute inpatient rehab admission with current diagnosis.  Call me for questions.  Trish Mage 04/15/2012, 9:34 AM  I can be reached at [phone].

## 2012-04-15 NOTE — Consult Note (Signed)
[facility] Medical Consultation  Irena Gaydos WUJ:[id number] DOB: Oct 16, 1950 DOA: 04/13/2012 PCP: Salli Real, MD   Requesting physician: Dr. Candelaria Celeste Date of consultation: 1.9.2014 Reason for consultation: confsuaion  Impression/Recommendations Delirium: - The family relates that she has not been taking Neurontin at home for quite some time. Her movements does seem slow her speech is also slow, her creatinine above her baseline which can be contributing to accumulation of neuron tin. Start IV fluids.  Which could be contributing to her delirium. I will go ahead and DC her Neurontin, we'll decrease her narcotics. DC'd the Benadryl and Reglan which can cause delirium. We'll use Haldol when necessary for agitation or confusion. -  I have spoken to the family about bringing from home any pictures from home. Try to keep her day and cycle by closing and opening her blinds in the room. - On physical exam she doesn't have any lateralization, no weakness, or facial droop which makes her stroke unlikely.  She had a mild temperature of 100.8 yesterday, I will go ahead and check a CBC, chest x-ray and a UA to rule out any type of infectious process. - Tylenol for fevers.   AKI: - Tachycardic, creatinine above baseline. - Hold lasix start IV fluids. - b-met now and AM.  Left knee DJD - continue low dose dose narcotics from home PT/OT already seen the patient. - on prophylaxis per ortho.  Tachycardia: - continue her metoprolol home dose. - She has been eating an drinking regularly but some what less as per family. - hold lasix, check a b-met  HTN (hypertension): - stable hold lasix continue beta-blockers.    HPI:  62 year old female with past medical history as DJD, hypertension that comes in for total knee replacement on the left. Everything went well to surgery. For family started getting concerned as she continued to be more more confused and was sleepy. Some were consulted for  alternative status. The patient has a slow speech. As per family members she fluctuates throughout the day sometimes she is oriented and some other times she is confused. She also has slow movements as per family members of her extremities. She has no facial droop or lateralization of her face arms or legs. She has been tolerating her diet with no signs of cough with eating. They relates no falls.  Review of Systems:  Constitutional:  No weight loss, night sweats, Fevers, chills, fatigue.  HEENT:  No headaches, Difficulty swallowing,Tooth/dental problems,Sore throat,  No sneezing, itching, ear ache, nasal congestion, post nasal drip,  Cardio-vascular:  No chest pain, Orthopnea, PND, swelling in lower extremities, anasarca, dizziness, palpitations  GI:  No heartburn, indigestion, abdominal pain, nausea, vomiting, diarrhea, change in bowel habits, loss of appetite  Resp:  No shortness of breath with exertion or at rest. No excess mucus, no productive cough, No non-productive cough, No coughing up of blood.No change in color of mucus.No wheezing.No chest wall deformity  Skin:  no rash or lesions.  GU:  no dysuria, change in color of urine, no urgency or frequency. No flank pain.  Musculoskeletal:  No joint pain or swelling. No decreased range of motion. No back pain.  Psych:    Past Medical History  Diagnosis Date  . Hypertension   . Difficult intubation     Fastrach #4 LMA then # 7 ETT used in 2006 [facility], cervical laminectomy)  . Hypothyroidism   . Cervical neuropathy     PERIPHERAL NEUROPATHY , HAD CERVICAL FUSION  . PONV (postoperative nausea  and vomiting)   . Hypercholesteremia   . Kidney stones 1990's    "twice; passed in hospital on my own" (04/13/2012)  . Arthritis     "all over" (04/13/2012)  . Breast cancer 2011    "right" (04/13/2012)   Past Surgical History  Procedure Date  . [place] luc ~ 2003    "benign tumor removed from up under gum" (04/13/2012)  . Posterior  cervical laminectomy 2006  . Eye surgery 2004    STENTS TO BIL EYES  . Tonsillectomy and adenoidectomy ~ 1954  . Abdominal hysterectomy ~ 1976  . Breast lumpectomy 2011    "right" (04/13/2012)  . Dacrocystorhinostomy ~ 2000    "put stents in my tear ducts; both eyes" (04/13/2012)  . Total knee arthroplasty with revision components 04/13/2012    Procedure: TOTAL KNEE ARTHROPLASTY WITH REVISION COMPONENTS;  Surgeon: Velna Ochs, MD;  Location: [facility] OR;  Service: Orthopedics;  Laterality: Left;   Social History:  reports that she quit smoking 8 days ago. Her smoking use included Cigarettes. She has a 48 pack-year smoking history. She has never used smokeless tobacco. She reports that she does not drink alcohol or use illicit drugs.  Allergies  Allergen Reactions  . Ivp Dye (Iodinated Diagnostic Agents) Other (See Comments)    "hot and sweaty and almost passed out"  . Aspirin Hives  . Sulfa Antibiotics Hives   Family History  Problem Relation Age of Onset  . Heart disease Father     Prior to Admission medications   Medication Sig Start Date End Date Taking? Authorizing Provider  anastrozole (ARIMIDEX) 1 MG tablet TAKE 1 TABLET BY MOUTH DAILY 10/20/11  Yes Pierce Crane, MD  Calcium Carbonate-Vitamin D (CALCIUM 600+D) 600-200 MG-UNIT TABS Take 1-2 tablets by mouth 2 (two) times daily. Take 1 tablet in morning and 2 tablets at night   Yes Historical Provider, MD  DULoxetine (CYMBALTA) 60 MG capsule Take 60 mg by mouth daily.     Yes Historical Provider, MD  folic acid (FOLVITE) 1 MG tablet Take 1 mg by mouth daily.     Yes Historical Provider, MD  furosemide (LASIX) 40 MG tablet Take 40 mg by mouth daily.     Yes Historical Provider, MD  gabapentin (NEURONTIN) 600 MG tablet Take 600 mg by mouth 4 (four) times daily.   Yes Historical Provider, MD  levothyroxine (SYNTHROID, LEVOTHROID) 125 MCG tablet Take 125 mcg by mouth daily.     Yes Historical Provider, MD  lidocaine (XYLOCAINE) 5 % ointment  Apply 1 application topically as needed. Using on both knees   Yes Historical Provider, MD  magnesium oxide (MAG-OX) 400 MG tablet Take 400 mg by mouth 2 (two) times daily.     Yes Historical Provider, MD  metoprolol (TOPROL-XL) 100 MG 24 hr tablet Take 100 mg by mouth daily.     Yes Historical Provider, MD  NIFEdipine (PROCARDIA XL/ADALAT-CC) 30 MG 24 hr tablet Take 30 mg by mouth daily.     Yes Historical Provider, MD  zolpidem (AMBIEN) 10 MG tablet Take 10 mg by mouth at bedtime as needed. For sleep   Yes Historical Provider, MD  Chlorhexidine Gluconate Cloth 2 % PADS Apply 6 each topically daily at 6 (six) AM. 04/15/12   Prince Rome, PA  enoxaparin (LOVENOX) 30 MG/0.3ML injection Inject 0.3 mLs (30 mg total) into the skin every 12 (twelve) hours. 04/15/12   Prince Rome, PA  methocarbamol (ROBAXIN) 500 MG tablet Take 1  tablet (500 mg total) by mouth every 6 (six) hours as needed. 04/15/12   Prince Rome, PA  morphine (MS CONTIN) 60 MG 12 hr tablet Take 1 tablet (60 mg total) by mouth 2 (two) times daily. 04/15/12   Prince Rome, PA  mupirocin ointment (BACTROBAN) 2 % Apply 1 application topically 2 (two) times daily. 04/15/12   Prince Rome, PA  oxyCODONE-acetaminophen (PERCOCET) 10-650 MG per tablet Take 1 tablet by mouth every 6 (six) hours as needed. For pain 04/15/12   Prince Rome, PA  warfarin (COUMADIN) 5 MG tablet Take as directed for INR 2-3 for 2 weeks 04/15/12   Prince Rome, PA   Physical Exam: Blood pressure 117/48, pulse 102, temperature 98.2 F (36.8 C), temperature source Oral, resp. rate 20, height 5\' 3"  (1.6 m), weight 95.255 kg (210 lb), SpO2 97.00%. Filed Vitals:   04/15/12 0709 04/15/12 1200 04/15/12 1421 04/15/12 1600  BP: 119/47  117/48   Pulse: 114  102   Temp: 98.9 F (37.2 C)  98.2 F (36.8 C)   TempSrc: Oral  Oral   Resp: 18 18 18 20   Height:      Weight:      SpO2: 93% 96% 97% 97%     General:  She is awake alert and oriented  x2 in no acute distress  Eyes: Anicteric muddy sclera  ENT: Dry mucous membranes  Neck: No JVD  Cardiovascular: Tachycardic with a regular rate positive S1 and S2 she has ejection murmur in the aortic area  Respiratory: Good air movement and clear to auscultation  Abdomen: Positive bowel sounds nontender nondistended and soft  Skin: No rashes or ulcerations  Musculoskeletal: Intact except for left knee  Psychiatric: Appropriate  Neurologic: She is awake alert and oriented x2 she has a coherent but slow language. 3-12 are grossly intact sensation is intact throughout muscle strength is 4-4 extremities.  Labs on Admission:  Basic Metabolic Panel:  Lab 04/14/12 1610 04/12/12 1014  NA 140 142  K 4.3 4.6  CL 103 104  CO2 29 26  GLUCOSE 128* 123*  BUN 17 16  CREATININE 1.00 0.90  CALCIUM 8.7 9.8  MG -- --  PHOS -- --   Liver Function Tests: No results found for this basename: AST:5,ALT:5,ALKPHOS:5,BILITOT:5,PROT:5,ALBUMIN:5 in the last 168 hours No results found for this basename: LIPASE:5,AMYLASE:5 in the last 168 hours No results found for this basename: AMMONIA:5 in the last 168 hours CBC:  Lab 04/15/12 0515 04/14/12 0610 04/12/12 1014  WBC 8.1 6.2 4.8  NEUTROABS -- -- 2.3  HGB 10.1* 9.9* 13.6  HCT 31.2* 29.5* 40.6  MCV 103.7* 102.4* 102.5*  PLT 155 156 203   Cardiac Enzymes: No results found for this basename: CKTOTAL:5,CKMB:5,CKMBINDEX:5,TROPONINI:5 in the last 168 hours BNP: No components found with this basename: POCBNP:5 CBG: No results found for this basename: GLUCAP:5 in the last 168 hours  Radiological Exams on Admission: No results found.  EKG: Pending  Time spent: [address] Rosine Beat [facility] Pager [phone]  If 7PM-7AM, please contact night-coverage [email or web address] Password [facility] 04/15/2012, 5:31 PM

## 2012-04-15 NOTE — Progress Notes (Addendum)
Physical Therapy Treatment Patient Details Name: [name] MRN: [id number] DOB: 1950-07-10 Today's Date: 04/15/2012 Time: 4098-1191 PT Time Calculation (min): 26 min  PT Assessment / Plan / Recommendation Comments on Treatment Session  Patient very confused and disoriented throughout visit.  Patient presenting with further confusion compared to yesterday.  Patient unaware of incontinence in urine, patient with difficulty following directions consistantly. Unable to perform mobility as patient's vitals at rest were 126 HR with bps 140s/50s.  Spoke with son who states mobility issues discussed yesterday such as leaning towards the side etc states these have not been that significant issues before and has developed within the past month or so.  Additionally, son states that she has been doing "strange" motions with her hands all afternoon.  PA Patsey Berthold. came into room at conclusion of session, notified him of changes in mental status and concerns for lack of mobility and function over yesterday and today.  Rec medical consult.    Follow Up Recommendations  CIR     Does the patient have the potential to tolerate intense rehabilitation     Barriers to Discharge        Equipment Recommendations       Recommendations for Other Services Rehab consult;Other (comment) (Medical consult per discussion with PA)  Frequency Min 5X/week   Plan      Precautions / Restrictions Precautions Precautions: Fall Precaution Comments: concerning neurological symptoms (contacted PA) Restrictions Weight Bearing Restrictions: Yes LLE Weight Bearing: Weight bearing as tolerated   Pertinent Vitals/Pain HR at rest 126; BP 140s/50s nsg and PA aware    Mobility  Bed Mobility Bed Mobility: Rolling Right;Rolling Left;Scooting to HOB Rolling Right: 1: +2 Total assist Rolling Right: Patient Percentage: 10% Rolling Left: 1: +2 Total assist Rolling Left: Patient Percentage: 10% Scooting to HOB: 1: +2 Total  assist Scooting to [facility]: Patient Percentage: 0% Transfers Transfers: Not assessed Ambulation/Gait Ambulation/Gait Assistance: Not tested (comment)      PT Goals Acute Rehab PT Goals PT Goal Formulation: With patient Time For Goal Achievement: 04/21/12 Potential to Achieve Goals: Fair  Visit Information  Last PT Received On: 04/15/12 Assistance Needed: +2    Subjective Data  Subjective: Patient very confused and disoriented; received saturated in urine, attempting to eat her fingers; son at bedside states that they are very concerned this is dramatically different from anything they have seen with her.  Multiple family members have been to see her and do not feel she is ok.   Cognition  Overall Cognitive Status: Impaired Area of Impairment: Safety/judgement;Awareness of errors;Awareness of deficits;Problem solving;Executive functioning Arousal/Alertness: Other (comment) (Pt very confused and disoriented; family very concerned) Orientation Level: Other (comment) (Pt gives appropriate answers, then confabulates  ) Behavior During Session: Lethargic Safety/Judgement: Decreased awareness of safety precautions;Decreased safety judgement for tasks assessed;Decreased awareness of need for assistance Safety/Judgement - Other Comments: Patient unaware of incontinence of urine Awareness of Errors: Assistance required to identify errors made;Assistance required to correct errors made Awareness of Deficits: poor awareness of deficits. emergent awareness Problem Solving: mod a functional basic (pt confused, trying to eat wires, pulse ox and empty fingers) Executive Functioning: decreased reasoning and decreased self insight Cognition - Other Comments: family very concerned over change in mental status    Balance  Balance Balance Assessed: No  End of Session PT - End of Session Activity Tolerance: Other (comment) (patient limited by HR at rest 126; very confused BP 146/50's)   GP      Norval Morton,  Arlon Bleier J 04/15/2012, 4:17 PM Charlotte Crumb, PT DPT  [phone]

## 2012-04-16 DIAGNOSIS — IMO0002 Reserved for concepts with insufficient information to code with codable children: Secondary | ICD-10-CM

## 2012-04-16 DIAGNOSIS — M1710 Unilateral primary osteoarthritis, unspecified knee: Secondary | ICD-10-CM

## 2012-04-16 LAB — BASIC METABOLIC PANEL
BUN: 18 mg/dL (ref 6–23)
Chloride: 103 mEq/L (ref 96–112)
GFR calc Af Amer: 71 mL/min — ABNORMAL LOW (ref >90)
Glucose, Bld: 148 mg/dL — ABNORMAL HIGH (ref 70–99)
Potassium: 3.4 mEq/L — ABNORMAL LOW (ref 3.5–5.1)
Sodium: 140 mEq/L (ref 135–145)

## 2012-04-16 LAB — CBC
Hemoglobin: 9.6 g/dL — ABNORMAL LOW (ref 12.0–15.0)
MCH: 34.2 pg — ABNORMAL HIGH (ref 26.0–34.0)
MCV: 102.5 fL — ABNORMAL HIGH (ref 78.0–100.0)
RBC: 2.81 MIL/uL — ABNORMAL LOW (ref 3.87–5.11)
WBC: 8.3 10*3/uL (ref 4.0–10.5)

## 2012-04-16 MED ORDER — WARFARIN SODIUM 2.5 MG PO TABS
2.5000 mg | ORAL_TABLET | Freq: Once | ORAL | Status: AC
  Administered 2012-04-16: 2.5 mg via ORAL
  Filled 2012-04-16: qty 1

## 2012-04-16 MED ORDER — POTASSIUM CHLORIDE CRYS ER 20 MEQ PO TBCR
40.0000 meq | EXTENDED_RELEASE_TABLET | Freq: Three times a day (TID) | ORAL | Status: AC
  Administered 2012-04-16 (×2): 40 meq via ORAL
  Filled 2012-04-16 (×2): qty 2

## 2012-04-16 MED ORDER — GABAPENTIN 300 MG PO CAPS
600.0000 mg | ORAL_CAPSULE | Freq: Four times a day (QID) | ORAL | Status: DC
  Administered 2012-04-16 – 2012-04-20 (×17): 600 mg via ORAL
  Filled 2012-04-16 (×20): qty 2

## 2012-04-16 NOTE — Progress Notes (Signed)
Physical Therapy Treatment Patient Details Name: [name] MRN: [id number] DOB: [birth year] Today's Date: 04/16/2012 Time:  -     PT Assessment / Plan / Recommendation Comments on Treatment Session  Pt more alert today although still presenting with significant deficits in functional mobility and incontinence issues. Will cotninue to progress activity as tolerated. Rec SNF upon d/c.    Follow Up Recommendations  CIR     Does the patient have the potential to tolerate intense rehabilitation     Barriers to Discharge        Equipment Recommendations       Recommendations for Other Services Rehab consult;Other (comment) (Medical consult per discussion with PA)  Frequency Min 5X/week   Plan Discharge plan remains appropriate    Precautions / Restrictions Precautions Precautions: Fall Precaution Comments: concerning neurological symptoms (contacted PA) Restrictions Weight Bearing Restrictions: Yes LLE Weight Bearing: Weight bearing as tolerated       Mobility  Bed Mobility Bed Mobility: Rolling Right;Rolling Left;Scooting to HOB Rolling Right: 3: Mod assist Rolling Left: 3: Mod assist Supine to Sit: 2: Max assist Sitting - Scoot to Edge of Bed: 3: Mod assist Sit to Supine: 1: +2 Total assist;HOB flat Sit to Supine: Patient Percentage: 0% Scooting to HOB: 1: +2 Total assist Scooting to [facility]: Patient Percentage: 0% Transfers Transfers: Sit to Stand;Stand to Sit Sit to Stand: 1: +2 Total assist Sit to Stand: Patient Percentage: 10% Stand to Sit: 1: +2 Total assist Stand to Sit: Patient Percentage: 10% Ambulation/Gait Ambulation/Gait Assistance: Not tested (comment)      PT Goals Acute Rehab PT Goals PT Goal Formulation: With patient Time For Goal Achievement: 04/21/12 Potential to Achieve Goals: Fair PT Goal: Sit at Edge Of Bed - Progress: Progressing toward goal PT Goal: Sit to Stand - Progress: Progressing toward goal PT Goal: Stand to Sit - Progress: Not  progressing  Visit Information  Last PT Received On: 04/16/12 Assistance Needed: +2    Subjective Data  Subjective: Patient very confused and disoriented; received saturated in urine, attempting to eat her fingers; son at bedside states that they are very concerned this is dramatically different from anything they have seen with her.  Multiple family members have been to see her and do not feel she is ok.   Cognition  Overall Cognitive Status: Impaired Area of Impairment: Safety/judgement;Awareness of errors;Awareness of deficits;Problem solving;Executive functioning Arousal/Alertness: Awake/alert Orientation Level: Oriented X4 / Intact Behavior During Session: Lethargic Safety/Judgement - Other Comments: Patient unaware of incontinence of urine Awareness of Errors: Assistance required to identify errors made;Assistance required to correct errors made Awareness of Deficits: poor awareness of deficits. emergent awareness Problem Solving:  (pt confused, trying to eat wires, pulse ox and empty fingers) Executive Functioning: decreased reasoning and decreased self insight    Balance  Balance Balance Assessed: No  End of Session PT - End of Session Activity Tolerance: Other (comment) (patient limited by HR at rest 126; very confused BP 146/50's)   GP     Fabio Asa 04/16/2012, 4:04 PM Charlotte Crumb, PT DPT  [phone]

## 2012-04-16 NOTE — Progress Notes (Signed)
ANTICOAGULATION CONSULT NOTE - Follow Up Consult  Pharmacy Consult for Coumadin Indication: VTE prophylaxis  Allergies  Allergen Reactions  . Ivp Dye (Iodinated Diagnostic Agents) Other (See Comments)    "hot and sweaty and almost passed out"  . Aspirin Hives  . Sulfa Antibiotics Hives    Patient Measurements: Height: 5\' 3"  (160 cm) Weight: 210 lb (95.255 kg) IBW/kg (Calculated) : 52.4   Vital Signs: Temp: 98.2 F (36.8 C) (01/10 0605) Temp src: Oral (01/10 0605) BP: 122/62 mmHg (01/10 0605) Pulse Rate: 102  (01/10 0605)  Labs:  Basename 04/16/12 0630 04/15/12 1809 04/15/12 0515 04/14/12 0610  HGB 9.6* -- 10.1* --  HCT 28.8* -- 31.2* 29.5*  PLT 153 -- 155 156  APTT -- -- -- --  LABPROT 24.4* -- 18.0* 14.7  INR 2.32* -- 1.54* 1.17  HEPARINUNFRC -- -- -- --  CREATININE 0.97 1.17* -- 1.00  CKTOTAL -- -- -- --  CKMB -- -- -- --  TROPONINI -- -- -- --    Estimated Creatinine Clearance: 65.2 ml/min (by C-G formula based on Cr of 0.97).   Medications:  Prescriptions prior to admission  Medication Sig Dispense Refill  . anastrozole (ARIMIDEX) 1 MG tablet TAKE 1 TABLET BY MOUTH DAILY  30 tablet  5  . Calcium Carbonate-Vitamin D (CALCIUM 600+D) 600-200 MG-UNIT TABS Take 1-2 tablets by mouth 2 (two) times daily. Take 1 tablet in morning and 2 tablets at night      . DULoxetine (CYMBALTA) 60 MG capsule Take 60 mg by mouth daily.        . folic acid (FOLVITE) 1 MG tablet Take 1 mg by mouth daily.        . furosemide (LASIX) 40 MG tablet Take 40 mg by mouth daily.        Marland Kitchen levothyroxine (SYNTHROID, LEVOTHROID) 125 MCG tablet Take 125 mcg by mouth daily.        Marland Kitchen lidocaine (XYLOCAINE) 5 % ointment Apply 1 application topically as needed. Using on both knees      . magnesium oxide (MAG-OX) 400 MG tablet Take 400 mg by mouth 2 (two) times daily.        . metoprolol (TOPROL-XL) 100 MG 24 hr tablet Take 100 mg by mouth daily.        Marland Kitchen NIFEdipine (PROCARDIA XL/ADALAT-CC) 30 MG 24  hr tablet Take 30 mg by mouth daily.        Marland Kitchen zolpidem (AMBIEN) 10 MG tablet Take 10 mg by mouth at bedtime as needed. For sleep      . [DISCONTINUED] diclofenac sodium (VOLTAREN) 1 % GEL Apply 2 g topically 2 (two) times daily as needed. For knees      . [DISCONTINUED] gabapentin (NEURONTIN) 600 MG tablet Take 600 mg by mouth 4 (four) times daily.      . [DISCONTINUED] morphine (MS CONTIN) 60 MG 12 hr tablet Take 60 mg by mouth 2 (two) times daily.      . [DISCONTINUED] oxyCODONE-acetaminophen (PERCOCET) 10-650 MG per tablet Take 1 tablet by mouth every 6 (six) hours as needed. For pain        Assessment: 62 year old woman s/p left total knee replacement to receive warfarin for VTE prophylaxis post op. INR therapeutic with large increase over past 24 hours. Patient also on Lovenox 30mg  Sq q 12hr until INR reaches 1.8 No bleeding complications noted, H/H and plts are stable. Noted Coumadin education done and documented in [facility] on 04/14/12.  Goal  of Therapy:  INR 2-3 Monitor platelets by anticoagulation protocol: Yes   Plan:  - Coumadin 2.5 mg PO x 1 today. Suspect maintenance dose will be ~5mg  daily. - Will f/up daily INR. - D/c Lovenox as INR>1.8.  Thanks, Christoper Fabian, PharmD, BCPS Clinical pharmacist, pager [phone] 04/16/2012 8:42 AM

## 2012-04-16 NOTE — Clinical Social Work Note (Signed)
Clinical Social Worker spoke with patient regarding confirmation on SNF and patient stated that she could not remember and referred CSW to speak with her daughter, [name] (c: [phone]). Per daughter, they prefer [facility] and potentially [facility]. Daughter reported that she will go by [name] Sunday to tour the facility. CSW notified daughter that [name] and [facility] no longer accepts [employer]. CSW will leave list of responses and SNF packet in room and daughter reported she will evaluate facilities over the weekend and notify CSW when bed confirmation is made. CSW will continue to follow.   Rozetta Nunnery MSW, [employer] [phone]

## 2012-04-16 NOTE — Progress Notes (Signed)
Subjective: 3 Days Post-Op Procedure(s) (LRB): TOTAL KNEE ARTHROPLASTY WITH REVISION COMPONENTS (Left) Has been some confusion as to whether [name] has been on gabapentin.  After long discussion with her husband and [name] herself we have determined that she has been on gabapentin 600 mg 4 times a day.  We will restart this medicine today. Activity level:  She is able to be weightbearing as tolerated on the left side.  Her therapy will progress slowly and she has not really ambulated in a few months.  Diet tolerance:  okay Voiding:  okay Patient reports pain as 5 on 0-10 scale.    Objective: Vital signs in last 24 hours: Temp:  [98.2 F (36.8 C)] 98.2 F (36.8 C) (01/10 0605) Pulse Rate:  [102-116] 102  (01/10 0605) Resp:  [18-20] 18  (01/10 0605) BP: (117-122)/(48-91) 122/62 mmHg (01/10 0605) SpO2:  [95 %-100 %] 100 % (01/10 0605)  Labs:  Basename 04/16/12 0630 04/15/12 0515 04/14/12 0610  HGB 9.6* 10.1* 9.9*    Basename 04/16/12 0630 04/15/12 0515  WBC 8.3 8.1  RBC 2.81* 3.01*  HCT 28.8* 31.2*  PLT 153 155    Basename 04/16/12 0630 04/15/12 1809  NA 140 139  K 3.4* 3.9  CL 103 102  CO2 27 27  BUN 18 17  CREATININE 0.97 1.17*  GLUCOSE 148* 132*  CALCIUM 8.7 8.5    Basename 04/16/12 0630 04/15/12 0515  LABPT -- --  INR 2.32* 1.54*    Physical Exam:  Neurologically intact ABD soft Neurovascular intact Sensation intact distally Intact pulses distally Dorsiflexion/Plantar flexion intact No cellulitis present Compartment soft  Assessment/Plan:  3 Days Post-Op Procedure(s) (LRB): TOTAL KNEE ARTHROPLASTY WITH REVISION COMPONENTS (Left) Advance diet Up with therapy D/C IV fluids Discharge to SNFmost likely on Monday. We would like to have therapy get her up as much as possible but progress will be slow. Continue with Lovenox and Coumadin for DVT prophylaxis. Restart Neurontin 600 mg 4 times a day.  Patient has been on this for quite some time. Thanks to  the [facility] hospitalist for the consult    Dalyah Pla R 04/16/2012, 12:56 PM

## 2012-04-16 NOTE — Progress Notes (Signed)
[facility]  Assessment/Plan: Delirium (04/15/2012) - resolved. - most likely medication induce. - can continue narcotics. - will follow at a distance meidcally stable for D/c.  Left knee DJD (04/13/2012) - per othro.  Tachycardia (04/15/2012) - resolved.  HTN (hypertension) (04/15/2012) - resume home meds.  AKI (acute kidney injury) (04/15/2012) - resolved with IV fluids.  HPI/Subjective: Pain an spasm with knee.  Objective: Filed Vitals:   04/15/12 2130 04/16/12 0000 04/16/12 0400 04/16/12 0605  BP: 122/91   122/62  Pulse: 116   102  Temp:    98.2 F (36.8 C)  TempSrc:    Oral  Resp: 20 18 18 18   Height:      Weight:      SpO2: 95% 100% 100% 100%    Intake/Output Summary (Last 24 hours) at 04/16/12 0756 Last data filed at 04/16/12 0600  Gross per 24 hour  Intake   1140 ml  Output    350 ml  Net    790 ml   Filed Weights   04/14/12 0600  Weight: 95.255 kg (210 lb)    Exam:  General: Alert, awake, oriented x3, in no acute distress.  HEENT: No bruits, no goiter.  Heart: Regular rate and rhythm, without murmurs, rubs, gallops.  Lungs: Good air movement, bilateral air movement.  Abdomen: Soft, nontender, nondistended, positive bowel sounds.  Neuro: alert an oriented x 4 coherent and fluent language.   Data Reviewed: Basic Metabolic Panel:  Lab 04/16/12 1610 04/15/12 1809 04/14/12 0610 04/12/12 1014  NA 140 139 140 142  K 3.4* 3.9 4.3 4.6  CL 103 102 103 104  CO2 27 27 29 26   GLUCOSE 148* 132* 128* 123*  BUN 18 17 17 16   CREATININE 0.97 1.17* 1.00 0.90  CALCIUM 8.7 8.5 8.7 9.8  MG -- -- -- --  PHOS -- -- -- --   Liver Function Tests: No results found for this basename: AST:5,ALT:5,ALKPHOS:5,BILITOT:5,PROT:5,ALBUMIN:5 in the last 168 hours No results found for this basename: LIPASE:5,AMYLASE:5 in the last 168 hours No results found for this basename: AMMONIA:5 in the last 168 hours CBC:  Lab 04/16/12 0630 04/15/12 0515 04/14/12 0610 04/12/12  1014  WBC 8.3 8.1 6.2 4.8  NEUTROABS -- -- -- 2.3  HGB 9.6* 10.1* 9.9* 13.6  HCT 28.8* 31.2* 29.5* 40.6  MCV 102.5* 103.7* 102.4* 102.5*  PLT 153 155 156 203   Cardiac Enzymes: No results found for this basename: CKTOTAL:5,CKMB:5,CKMBINDEX:5,TROPONINI:5 in the last 168 hours BNP (last 3 results) No results found for this basename: PROBNP:3 in the last 8760 hours CBG: No results found for this basename: GLUCAP:5 in the last 168 hours  Recent Results (from the past 240 hour(s))  SURGICAL PCR SCREEN     Status: Abnormal   Collection Time   04/12/12 10:17 AM      Component Value Range Status Comment   MRSA, PCR POSITIVE (*) NEGATIVE Final    Staphylococcus aureus POSITIVE (*) NEGATIVE Final      Studies: Dg Chest 2 View  04/15/2012  *RADIOLOGY REPORT*  Clinical Data: Confusion.  CHEST - 2 VIEW  Comparison: 04/12/2012.  Findings: Suboptimal inspiration.  Low volumes accentuate the pulmonary vasculature and size of the cardiopericardial silhouette. No airspace disease.  No pleural effusion. Right AC joint osteoarthritis.  IMPRESSION: Low volume chest.  No gross acute cardiopulmonary disease.   Original Report Authenticated By: Andreas Newport, M.D.     Scheduled Meds:   . anastrozole  1 mg Oral Daily  .  Chlorhexidine Gluconate Cloth  6 each Topical Q0600  . docusate sodium  100 mg Oral BID  . DULoxetine  60 mg Oral Daily  . enoxaparin (LOVENOX) injection  30 mg Subcutaneous Q12H  . folic acid  1 mg Oral Daily  . levothyroxine  125 mcg Oral QAC breakfast  . magnesium oxide  400 mg Oral BID  . metoprolol succinate  100 mg Oral Daily  . morphine  60 mg Oral BID  . mupirocin ointment  1 application Nasal BID  . NIFEdipine  30 mg Oral Daily  . potassium chloride  40 mEq Oral BID  . warfarin   Does not apply Once  . Warfarin - Pharmacist Dosing Inpatient   Does not apply q1800   Continuous Infusions:   . sodium chloride       Marinda Elk  [facility] Pager  [phone]. If 8PM-8AM, please contact night-coverage at [email or web address], password [facility] 04/16/2012, 7:56 AM  LOS: 3 days

## 2012-04-17 LAB — PROTIME-INR: Prothrombin Time: 14.3 seconds (ref 11.6–15.2)

## 2012-04-17 MED ORDER — WARFARIN SODIUM 7.5 MG PO TABS
7.5000 mg | ORAL_TABLET | Freq: Once | ORAL | Status: AC
  Administered 2012-04-17: 7.5 mg via ORAL
  Filled 2012-04-17 (×2): qty 1

## 2012-04-17 NOTE — Progress Notes (Signed)
Physical Therapy Treatment Patient Details Name: [name] MRN: [id number] DOB: 04-08-1950 Today's Date: 04/17/2012 Time: 1410-1455 PT Time Calculation (min): 45 min  PT Assessment / Plan / Recommendation Comments on Treatment Session  Pt with much improved cognition today.  Mobility improving but still not standing well enough to perform stand pivot transfer.  Used maximove today.      Follow Up Recommendations  SNF     Does the patient have the potential to tolerate intense rehabilitation     Barriers to Discharge        Equipment Recommendations  None recommended by PT    Recommendations for Other Services    Frequency 7X/week   Plan Discharge plan remains appropriate;Frequency needs to be updated    Precautions / Restrictions Precautions Precautions: Fall Restrictions LLE Weight Bearing: Weight bearing as tolerated   Pertinent Vitals/Pain Lt knee pain especially after flexing knee ~75 degrees while using maximove.    Mobility  Bed Mobility Rolling Right: 3: Mod assist;With rail Rolling Left: 3: Mod assist;With rail Supine to Sit: 3: Mod assist Sitting - Scoot to Edge of Bed: 3: Mod assist Sit to Supine: 3: Mod assist;HOB flat Details for Bed Mobility Assistance: Assist to move legs off EOB and to bring trunk up.  Assist to bring legs back up onto bed when returning to supine. Transfers Sit to Stand: 1: +2 Total assist Sit to Stand: Patient Percentage: 60% Stand to Sit: 1: +2 Total assist Stand to Sit: Patient Percentage: 60% Transfer via Lift Equipment: Maximove Details for Transfer Assistance: Pt achieved standing but with flexed posture. Used maximove for bed to chair. Ambulation/Gait Ambulation/Gait Assistance: Not tested (comment)    Exercises Total Joint Exercises Ankle Circles/Pumps: AROM;Both;10 reps;Supine Quad Sets: AAROM;Left;10 reps;Supine Short Arc Quad: AAROM;Left;10 reps;Supine Heel Slides: AAROM;Left;10 reps;Supine Hip  ABduction/ADduction: AAROM;Left;10 reps;Supine Straight Leg Raises: AAROM;Supine;Left;10 reps Goniometric ROM: Grossly achieved 75 degrees lt knee flexion while in maximove to manuever lt leg around pillar of maximove   PT Diagnosis:    PT Problem List:   PT Treatment Interventions:     PT Goals Acute Rehab PT Goals PT Goal: Sit at Edge Of Bed - Progress: Met PT Goal: Sit to Stand - Progress: Progressing toward goal PT Goal: Stand to Sit - Progress: Progressing toward goal PT Goal: Ambulate - Progress: Progressing toward goal  Visit Information  Last PT Received On: 04/17/12 Assistance Needed: +2    Subjective Data  Subjective: Pt alert and oriented.   Cognition  Overall Cognitive Status: Appears within functional limits for tasks assessed/performed Arousal/Alertness: Awake/alert Orientation Level: Appears intact for tasks assessed Behavior During Session: [facility] for tasks performed Cognition - Other Comments: Much improved cognition.    Balance  Static Sitting Balance Static Sitting - Balance Support: No upper extremity supported Static Sitting - Level of Assistance: 5: Stand by assistance Static Sitting - Comment/# of Minutes: Sat EOB x 15 minutes  End of Session PT - End of Session Equipment Utilized During Treatment: Gait belt Activity Tolerance: Patient tolerated treatment well Patient left: in chair;with call bell/phone within reach Nurse Communication: Mobility status;Need for lift equipment;Patient requests pain meds CPM Left Knee CPM Left Knee: Off Left Knee Flexion (Degrees): 60    GP     Alexus Michael 04/17/2012, 3:12 PM  [facility] PT [phone]

## 2012-04-17 NOTE — Progress Notes (Signed)
Subjective: 4 Days Post-Op Procedure(s) (LRB): TOTAL KNEE ARTHROPLASTY WITH REVISION COMPONENTS (Left)  Activity level:  OOB with PT some Diet tolerance:  regular Voiding:  Well Passing gas and scant BM Patient reports pain as mild.    Objective: Vital signs in last 24 hours: Temp:  [98.8 F (37.1 C)-98.9 F (37.2 C)] 98.8 F (37.1 C) (01/11 0620) Pulse Rate:  [102-108] 105  (01/11 0620) Resp:  [16-20] 18  (01/11 0800) BP: (112-130)/(58-85) 126/61 mmHg (01/11 0620) SpO2:  [95 %-100 %] 95 % (01/11 0620)  Labs:  Basename 04/16/12 0630 04/15/12 0515  HGB 9.6* 10.1*    Basename 04/16/12 0630 04/15/12 0515  WBC 8.3 8.1  RBC 2.81* 3.01*  HCT 28.8* 31.2*  PLT 153 155    Basename 04/16/12 0630 04/15/12 1809  NA 140 139  K 3.4* 3.9  CL 103 102  CO2 27 27  BUN 18 17  CREATININE 0.97 1.17*  GLUCOSE 148* 132*  CALCIUM 8.7 8.5    Basename 04/17/12 0610 04/16/12 0630  LABPT -- --  INR 1.13 2.32*    Physical Exam:  ABD soft Neurovascular intact Sensation intact distally Intact pulses distally Dorsiflexion/Plantar flexion intact Incision: dressing C/D/I Compartment soft  Assessment/Plan:  4 Days Post-Op Procedure(s) (LRB): TOTAL KNEE ARTHROPLASTY WITH REVISION COMPONENTS (Left)  Discharge to SNF, likely on Monday Continue to mobilize with PT    Eriyonna Matsushita A. 04/17/2012, 9:13 AM

## 2012-04-17 NOTE — Progress Notes (Signed)
ANTICOAGULATION CONSULT NOTE - Follow Up Consult  Pharmacy Consult for Coumadin Indication: VTE prophylaxis  Allergies  Allergen Reactions  . Ivp Dye (Iodinated Diagnostic Agents) Other (See Comments)    "hot and sweaty and almost passed out"  . Aspirin Hives  . Sulfa Antibiotics Hives    Patient Measurements: Height: 5\' 3"  (160 cm) Weight: 210 lb (95.255 kg) IBW/kg (Calculated) : 52.4   Vital Signs: Temp: 98.8 F (37.1 C) (01/11 0620) Temp src: Oral (01/11 0620) BP: 126/61 mmHg (01/11 0620) Pulse Rate: 105  (01/11 0620)  Labs:  Basename 04/17/12 0610 04/16/12 0630 04/15/12 1809 04/15/12 0515  HGB -- 9.6* -- 10.1*  HCT -- 28.8* -- 31.2*  PLT -- 153 -- 155  APTT -- -- -- --  LABPROT 14.3 24.4* -- 18.0*  INR 1.13 2.32* -- 1.54*  HEPARINUNFRC -- -- -- --  CREATININE -- 0.97 1.17* --  CKTOTAL -- -- -- --  CKMB -- -- -- --  TROPONINI -- -- -- --    Estimated Creatinine Clearance: 65.2 ml/min (by C-G formula based on Cr of 0.97).   Medications:  Prescriptions prior to admission  Medication Sig Dispense Refill  . anastrozole (ARIMIDEX) 1 MG tablet TAKE 1 TABLET BY MOUTH DAILY  30 tablet  5  . Calcium Carbonate-Vitamin D (CALCIUM 600+D) 600-200 MG-UNIT TABS Take 1-2 tablets by mouth 2 (two) times daily. Take 1 tablet in morning and 2 tablets at night      . DULoxetine (CYMBALTA) 60 MG capsule Take 60 mg by mouth daily.        . folic acid (FOLVITE) 1 MG tablet Take 1 mg by mouth daily.        . furosemide (LASIX) 40 MG tablet Take 40 mg by mouth daily.        Marland Kitchen levothyroxine (SYNTHROID, LEVOTHROID) 125 MCG tablet Take 125 mcg by mouth daily.        Marland Kitchen lidocaine (XYLOCAINE) 5 % ointment Apply 1 application topically as needed. Using on both knees      . magnesium oxide (MAG-OX) 400 MG tablet Take 400 mg by mouth 2 (two) times daily.        . metoprolol (TOPROL-XL) 100 MG 24 hr tablet Take 100 mg by mouth daily.        Marland Kitchen NIFEdipine (PROCARDIA XL/ADALAT-CC) 30 MG 24 hr  tablet Take 30 mg by mouth daily.        Marland Kitchen zolpidem (AMBIEN) 10 MG tablet Take 10 mg by mouth at bedtime as needed. For sleep      . [DISCONTINUED] diclofenac sodium (VOLTAREN) 1 % GEL Apply 2 g topically 2 (two) times daily as needed. For knees      . [DISCONTINUED] gabapentin (NEURONTIN) 600 MG tablet Take 600 mg by mouth 4 (four) times daily.      . [DISCONTINUED] morphine (MS CONTIN) 60 MG 12 hr tablet Take 60 mg by mouth 2 (two) times daily.      . [DISCONTINUED] oxyCODONE-acetaminophen (PERCOCET) 10-650 MG per tablet Take 1 tablet by mouth every 6 (six) hours as needed. For pain        Assessment: 62 year old woman s/p left total knee replacement continues to receive warfarin for VTE prophylaxis post op. INR sub-therapeutic today following abrupt increase yesterday. Makes me suspicious of lab error either today or yesterday. Patient now off Lovenox 30mg  Sq q 12hr since INR was > 2 on 1/10 (pland was to continue until INR reaches 1.8). No  bleeding complications noted, H/H and plts are stable. Noted Coumadin education done and documented in [facility] on 04/14/12.  Goal of Therapy:  INR 2-3 Monitor platelets by anticoagulation protocol: Yes   Plan:  - Coumadin 7.5 mg PO x 1 today.  - Will f/up daily INR. - Will leave Lovenox off for now, but resume in AM if INR remains below goal.   Thanks, Sony Schlarb K. Allena Katz, PharmD, BCPS.  Clinical Pharmacist Pager [phone]. 04/17/2012 8:50 AM

## 2012-04-18 LAB — PROTIME-INR: INR: 3.83 — ABNORMAL HIGH (ref 0.00–1.49)

## 2012-04-18 MED ORDER — BISACODYL 10 MG RE SUPP: 10.0000 mg | Freq: Every day | RECTAL | Status: DC | PRN

## 2012-04-18 MED ORDER — POLYETHYLENE GLYCOL 3350 17 G PO PACK
17.0000 g | PACK | Freq: Every day | ORAL | Status: DC
  Administered 2012-04-18 – 2012-04-19 (×2): 17 g via ORAL
  Filled 2012-04-18 (×3): qty 1

## 2012-04-18 NOTE — Progress Notes (Signed)
Physical Therapy Treatment Patient Details Name: [name] MRN: [id number] DOB: 06/07/1950 Today's Date: 04/18/2012 Time: 7829-5621 PT Time Calculation (min): 30 min  PT Assessment / Plan / Recommendation Comments on Treatment Session  continuing improvements in cognition and ability to participate; Improved standing posture and tolerance, but still with difficulty stepping R or LLE; ABle to perform basic squat pivot with +2 assist; Noted for SNF for rehab; PT in agreement    Follow Up Recommendations  SNF     Does the patient have the potential to tolerate intense rehabilitation     Barriers to Discharge        Equipment Recommendations  None recommended by PT    Recommendations for Other Services    Frequency 7X/week   Plan Discharge plan remains appropriate;Frequency needs to be updated    Precautions / Restrictions Precautions Precautions: Fall Restrictions LLE Weight Bearing: Weight bearing as tolerated   Pertinent Vitals/Pain 7/10 pain L knee post transfer; RN notified; pt repositioned in recliner    Mobility  Bed Mobility Bed Mobility: Supine to Sit;Sitting - Scoot to Edge of Bed Supine to Sit: 4: Min assist;With rails;HOB elevated Sitting - Scoot to [place] of Bed: 3: Mod assist Details for Bed Mobility Assistance: Near constant, step-by-step cues for technique including alternatley moving LEs to EOB (required min assist), and scooting hips and trunk to EOB in prep for sitting up; Good use of rails to help elevate trunk form bed, requiring only min assist light mod assist with use of pad to reciprocal scoot to EOB Transfers Transfers: Sit to Stand;Stand to Sit;Squat Pivot Transfers Sit to Stand: 1: +2 Total assist Sit to Stand: Patient Percentage: 60% Stand to Sit: 1: +2 Total assist Stand to Sit: Patient Percentage: 60% Squat Pivot Transfers: 1: +2 Total assist Squat Pivot Transfers: Patient Percentage: 40% Details for Transfer Assistance: Session focused on  sit to and from stand, and working on standing tolerance; Defintiely dependent on +2 assist and momentum for successful sit to stand; once standing, max verbal and tactile cues to fully extend hips and knees bilaterally for more stable upright standing; Able to tolerate 2 reps of sit to stand; Flexed posture improves with cues; knees unable to fully extend in standing; Squat pivot transfer with +2 assist and blocking of non-op, R knee Ambulation/Gait Ambulation/Gait Assistance: Not tested (comment)    Exercises Total Joint Exercises Quad Sets: AAROM;5 reps;Seated (difficulty with quad firing) Long Arc Quad: AAROM;Left;10 reps;Seated (continued weakness)   PT Diagnosis:    PT Problem List:   PT Treatment Interventions:     PT Goals Acute Rehab PT Goals Time For Goal Achievement: 04/21/12 Potential to Achieve Goals: Fair Pt will Sit at [facility] of Bed: with min assist PT Goal: Sit at Edge Of Bed - Progress: Progressing toward goal Pt will go Sit to Stand: with min assist PT Goal: Sit to Stand - Progress: Progressing toward goal Pt will go Stand to Sit: with min assist PT Goal: Stand to Sit - Progress: Progressing toward goal  Visit Information  Last PT Received On: 04/18/12 Assistance Needed: +2    Subjective Data  Subjective: States, "I'll try" seems much clearer than recent sessions; States hasn't been able to fully straighten either LE for quite some time Patient Stated Goal: to walk again   Cognition  Overall Cognitive Status: Appears within functional limits for tasks assessed/performed Arousal/Alertness: Awake/alert Orientation Level: Appears intact for tasks assessed Behavior During Session: [facility] for tasks performed Cognition - Other Comments:  Much improved cognition.    Balance     End of Session     GP     Van Clines [facility] [place],  [phone]  04/18/2012, 10:37 AM

## 2012-04-18 NOTE — Progress Notes (Signed)
ANTICOAGULATION CONSULT NOTE - Follow Up Consult  Pharmacy Consult for Coumadin Indication: VTE prophylaxis  Allergies  Allergen Reactions  . Ivp Dye (Iodinated Diagnostic Agents) Other (See Comments)    "hot and sweaty and almost passed out"  . Aspirin Hives  . Sulfa Antibiotics Hives    Patient Measurements: Height: 5\' 3"  (160 cm) Weight: 210 lb (95.255 kg) IBW/kg (Calculated) : 52.4   Vital Signs: Temp: 99.2 F (37.3 C) (01/12 0511) Temp src: Oral (01/12 0511) BP: 111/52 mmHg (01/12 0511) Pulse Rate: 101  (01/12 0511)  Labs:  Basename 04/18/12 0640 04/17/12 0610 04/16/12 0630 04/15/12 1809  HGB -- -- 9.6* --  HCT -- -- 28.8* --  PLT -- -- 153 --  APTT -- -- -- --  LABPROT 35.4* 14.3 24.4* --  INR 3.83* 1.13 2.32* --  HEPARINUNFRC -- -- -- --  CREATININE -- -- 0.97 1.17*  CKTOTAL -- -- -- --  CKMB -- -- -- --  TROPONINI -- -- -- --    Estimated Creatinine Clearance: 65.2 ml/min (by C-G formula based on Cr of 0.97).   Medications:  Prescriptions prior to admission  Medication Sig Dispense Refill  . anastrozole (ARIMIDEX) 1 MG tablet TAKE 1 TABLET BY MOUTH DAILY  30 tablet  5  . Calcium Carbonate-Vitamin D (CALCIUM 600+D) 600-200 MG-UNIT TABS Take 1-2 tablets by mouth 2 (two) times daily. Take 1 tablet in morning and 2 tablets at night      . DULoxetine (CYMBALTA) 60 MG capsule Take 60 mg by mouth daily.        . folic acid (FOLVITE) 1 MG tablet Take 1 mg by mouth daily.        . furosemide (LASIX) 40 MG tablet Take 40 mg by mouth daily.        Marland Kitchen levothyroxine (SYNTHROID, LEVOTHROID) 125 MCG tablet Take 125 mcg by mouth daily.        Marland Kitchen lidocaine (XYLOCAINE) 5 % ointment Apply 1 application topically as needed. Using on both knees      . magnesium oxide (MAG-OX) 400 MG tablet Take 400 mg by mouth 2 (two) times daily.        . metoprolol (TOPROL-XL) 100 MG 24 hr tablet Take 100 mg by mouth daily.        Marland Kitchen NIFEdipine (PROCARDIA XL/ADALAT-CC) 30 MG 24 hr tablet  Take 30 mg by mouth daily.        Marland Kitchen zolpidem (AMBIEN) 10 MG tablet Take 10 mg by mouth at bedtime as needed. For sleep      . [DISCONTINUED] diclofenac sodium (VOLTAREN) 1 % GEL Apply 2 g topically 2 (two) times daily as needed. For knees      . [DISCONTINUED] gabapentin (NEURONTIN) 600 MG tablet Take 600 mg by mouth 4 (four) times daily.      . [DISCONTINUED] morphine (MS CONTIN) 60 MG 12 hr tablet Take 60 mg by mouth 2 (two) times daily.      . [DISCONTINUED] oxyCODONE-acetaminophen (PERCOCET) 10-650 MG per tablet Take 1 tablet by mouth every 6 (six) hours as needed. For pain        Assessment: 62 year old woman s/p left total knee replacement continues to receive warfarin for VTE prophylaxis post op. INR now supra-therapeutic. Patient off Lovenox 30mg  Sq q 12hr since INR was > 2 on 1/10 (plan was to continue until INR reaches 1.8).No bleeding complications noted, H/H and plts are stable. Noted Coumadin education done and documented in [facility]  on 04/14/12.  Goal of Therapy:  INR 2-3 Monitor platelets by anticoagulation protocol: Yes   Plan:  - Hold Coumadin today  - Will f/up daily INR.  Thanks, Lavel Rieman K. Allena Katz, PharmD, BCPS.  Clinical Pharmacist Pager [phone]. 04/18/2012 9:03 AM

## 2012-04-18 NOTE — Progress Notes (Signed)
Subjective: 5 Days Post-Op Procedure(s) (LRB): TOTAL KNEE ARTHROPLASTY WITH REVISION COMPONENTS (Left)  Activity level:  OOB with PT--better yesterday Diet tolerance:  regular Voiding:  Well Passing gas and scant BM Patient reports pain as mild.    Objective: Vital signs in last 24 hours: Temp:  [99.2 F (37.3 C)-100.4 F (38 C)] 99.2 F (37.3 C) (01/12 0511) Pulse Rate:  [99-107] 101  (01/12 0511) Resp:  [18] 18  (01/12 0511) BP: (111-127)/(52-64) 111/52 mmHg (01/12 0511) SpO2:  [93 %-100 %] 96 % (01/12 0511)  Labs:  Basename 04/16/12 0630  HGB 9.6*    Basename 04/16/12 0630  WBC 8.3  RBC 2.81*  HCT 28.8*  PLT 153    Basename 04/16/12 0630 04/15/12 1809  NA 140 139  K 3.4* 3.9  CL 103 102  CO2 27 27  BUN 18 17  CREATININE 0.97 1.17*  GLUCOSE 148* 132*  CALCIUM 8.7 8.5    Basename 04/18/12 0640 04/17/12 0610  LABPT -- --  INR 3.83* 1.13    Physical Exam:  scant sang drainage at lower end of incision, no redness or expressible drainage, dressing changed NVI Knee lacks full extension comfortably by 20 degrees  Assessment/Plan:  5 Days Post-Op Procedure(s) (LRB): TOTAL KNEE ARTHROPLASTY WITH REVISION COMPONENTS (Left)  Discharge to SNF, likely on Monday Continue to mobilize with PT VTE-Coumadin   Ismael Treptow A. 04/18/2012, 7:36 AM

## 2012-04-19 LAB — PROTIME-INR: INR: 3.47 — ABNORMAL HIGH (ref 0.00–1.49)

## 2012-04-19 MED ORDER — GABAPENTIN 300 MG PO CAPS: 600.0000 mg | ORAL_CAPSULE | Freq: Four times a day (QID) | ORAL | Status: DC

## 2012-04-19 NOTE — Progress Notes (Signed)
Subjective: 6 Days Post-Op Procedure(s) (LRB): TOTAL KNEE ARTHROPLASTY WITH REVISION COMPONENTS (Left)  Activity level:  wbat Diet tolerance:  ok Voiding:  ok Patient reports pain as 2 on 0-10 scale.    Objective: Vital signs in last 24 hours: Temp:  [98.6 F (37 C)-99.7 F (37.6 C)] 98.6 F (37 C) (01/13 0617) Pulse Rate:  [98-102] 98  (01/13 0617) Resp:  [18-20] 18  (01/13 0617) BP: (129-134)/(58-68) 134/68 mmHg (01/13 0617) SpO2:  [97 %-100 %] 100 % (01/13 0617)  Labs: No results found for this basename: HGB:5 in the last 72 hours No results found for this basename: WBC:2,RBC:2,HCT:2,PLT:2 in the last 72 hours No results found for this basename: NA:2,K:2,CL:2,CO2:2,BUN:2,CREATININE:2,GLUCOSE:2,CALCIUM:2 in the last 72 hours  Basename 04/19/12 0710 04/18/12 0640  LABPT -- --  INR 3.47* 3.83*    Physical Exam:  Neurologically intact ABD soft Neurovascular intact Sensation intact distally Intact pulses distally Dorsiflexion/Plantar flexion intact Incision: scant drainage No cellulitis present Compartment soft  Assessment/Plan:  6 Days Post-Op Procedure(s) (LRB): TOTAL KNEE ARTHROPLASTY WITH REVISION COMPONENTS (Left) Advance diet Up with therapy Discharge to SNF    [name] R 04/19/2012, 9:46 AM

## 2012-04-19 NOTE — Clinical Social Work Note (Addendum)
Clinical Social Worker spoke with daughter and she and her family have decided on [facility] as their preferred facility [facility] did not have availability today. CSW sent additional clinicals to [facility] for prior authorization before patient can be transferred to next facility. CSW will await authorization from [facility] and will continue to follow.   16:54pm CSW received call from [facility], Human rep and they have not receive authorization yet for transfer to SNF.   Rozetta Nunnery MSW, [employer] [phone]

## 2012-04-19 NOTE — Discharge Summary (Signed)
Patient [name] MRN: [id number] DOB/AGE: [birth year] 62 y.o.  Admit date: 04/13/2012 Discharge date: 04/19/2012  Admission Diagnoses:  Principal Problem:  *Delirium Active Problems:  Left knee DJD  Tachycardia  HTN (hypertension)  AKI (acute kidney injury)   Discharge Diagnoses:  Same  Past Medical History  Diagnosis Date  . Hypertension   . Difficult intubation     Fastrach #4 LMA then # 7 ETT used in 2006 [facility] cervical laminectomy)  . Hypothyroidism   . Cervical neuropathy     PERIPHERAL NEUROPATHY , HAD CERVICAL FUSION  . PONV (postoperative nausea and vomiting)   . Hypercholesteremia   . Kidney stones 1990's    "twice; passed in hospital on my own" (04/13/2012)  . Arthritis     "all over" (04/13/2012)  . Breast cancer 2011    "right" (04/13/2012)    Surgeries: Procedure(s): TOTAL KNEE ARTHROPLASTY WITH REVISION COMPONENTS on 04/13/2012   Consultants:  [facility]  Discharged Condition: Improved  Hospital Course: Nohely Whitehorn is an 62 y.o. female who was admitted 04/13/2012 for operative treatment ofDelirium. Patient has severe unremitting pain that affects sleep, daily activities, and work/hobbies. After pre-op clearance the patient was taken to the operating room on 04/13/2012 and underwent  Procedure(s): TOTAL KNEE ARTHROPLASTY WITH REVISION COMPONENTS.    Patient was given perioperative antibiotics: Anti-infectives     Start     Dose/Rate Route Frequency Ordered Stop   04/13/12 2100   ceFAZolin (ANCEF) IVPB 2 g/50 mL premix        2 g 100 mL/hr over 30 Minutes Intravenous Every 6 hours 04/13/12 1929 04/14/12 0330   04/12/12 1434   ceFAZolin (ANCEF) IVPB 2 g/50 mL premix        2 g 100 mL/hr over 30 Minutes Intravenous 60 min pre-op 04/12/12 1434 04/13/12 1420           Patient was given sequential compression devices, early ambulation, and chemoprophylaxis to prevent DVT.  Patient benefited maximally from hospital stay and there were  no complications.    Recent vital signs: Patient Vitals for the past 24 hrs:  BP Temp Temp src Pulse Resp SpO2  04/19/12 0617 134/68 mmHg 98.6 F (37 C) Oral 98  18  100 %  Apr 20, 2012 2136 129/65 mmHg 99.7 F (37.6 C) Oral 98  18  100 %  04-20-2012 1600 - - - - 18  -  04/20/12 1445 133/58 mmHg 99 F (37.2 C) Oral 102  20  97 %  04-20-12 1128 - - - - 20  -     Recent laboratory studies:  Basename 04/19/12 0710 April 20, 2012 0640  WBC -- --  HGB -- --  HCT -- --  PLT -- --  NA -- --  K -- --  CL -- --  CO2 -- --  BUN -- --  CREATININE -- --  GLUCOSE -- --  INR 3.47* 3.83*  CALCIUM -- --     Discharge Medications:     Medication List     As of 04/19/2012  9:49 AM    STOP taking these medications         diclofenac sodium 1 % Gel   Commonly known as: VOLTAREN      gabapentin 600 MG tablet   Commonly known as: NEURONTIN      TAKE these medications         anastrozole 1 MG tablet   Commonly known as: ARIMIDEX   TAKE 1 TABLET BY MOUTH  DAILY      Calcium 600+D 600-200 MG-UNIT Tabs   Generic drug: Calcium Carbonate-Vitamin D   Take 1-2 tablets by mouth 2 (two) times daily. Take 1 tablet in morning and 2 tablets at night      Chlorhexidine Gluconate Cloth 2 % Pads   Apply 6 each topically daily at 6 (six) AM.      DULoxetine 60 MG capsule   Commonly known as: CYMBALTA   Take 60 mg by mouth daily.      folic acid 1 MG tablet   Commonly known as: FOLVITE   Take 1 mg by mouth daily.      furosemide 40 MG tablet   Commonly known as: LASIX   Take 40 mg by mouth daily.      gabapentin 300 MG capsule   Commonly known as: NEURONTIN   Take 2 capsules (600 mg total) by mouth 4 (four) times daily.      levothyroxine 125 MCG tablet   Commonly known as: SYNTHROID, LEVOTHROID   Take 125 mcg by mouth daily.      lidocaine 5 % ointment   Commonly known as: XYLOCAINE   Apply 1 application topically as needed. Using on both knees      magnesium oxide 400 MG tablet    Commonly known as: MAG-OX   Take 400 mg by mouth 2 (two) times daily.      methocarbamol 500 MG tablet   Commonly known as: ROBAXIN   Take 1 tablet (500 mg total) by mouth every 6 (six) hours as needed.      metoprolol succinate 100 MG 24 hr tablet   Commonly known as: TOPROL-XL   Take 100 mg by mouth daily.      morphine 60 MG 12 hr tablet   Commonly known as: MS CONTIN   Take 1 tablet (60 mg total) by mouth 2 (two) times daily.      mupirocin ointment 2 %   Commonly known as: BACTROBAN   Apply 1 application topically 2 (two) times daily.      NIFEdipine 30 MG 24 hr tablet   Commonly known as: PROCARDIA-XL/ADALAT-CC/NIFEDICAL-XL   Take 30 mg by mouth daily.      oxyCODONE-acetaminophen 10-650 MG per tablet   Commonly known as: PERCOCET   Take 1 tablet by mouth every 6 (six) hours as needed. For pain      warfarin 5 MG tablet   Commonly known as: COUMADIN   Take as directed for INR 2-3 for 2 weeks      zolpidem 10 MG tablet   Commonly known as: AMBIEN   Take 10 mg by mouth at bedtime as needed. For sleep        Diagnostic Studies: Dg Chest 2 View  04/15/2012  *RADIOLOGY REPORT*  Clinical Data: Confusion.  CHEST - 2 VIEW  Comparison: 04/12/2012.  Findings: Suboptimal inspiration.  Low volumes accentuate the pulmonary vasculature and size of the cardiopericardial silhouette. No airspace disease.  No pleural effusion. Right AC joint osteoarthritis.  IMPRESSION: Low volume chest.  No gross acute cardiopulmonary disease.   Original Report Authenticated By: Andreas Newport, M.D.    Dg Chest 2 View  04/12/2012  *RADIOLOGY REPORT*  Clinical Data: Preop for knee arthroplasty.  Ex-smoker.  CHEST - 2 VIEW  Comparison: 01/17/2010  Findings: Moderate upper and mid thoracic spondylosis.  Apical lordotic frontal view. Midline trachea.  Borderline cardiomegaly.  Tortuous descending thoracic aorta. No pleural effusion or pneumothorax.  Clear  lungs.  IMPRESSION: No acute cardiopulmonary disease.    Original Report Authenticated By: Jeronimo Greaves, M.D.     Disposition: Final discharge disposition not confirmed      Discharge Orders    Future Orders Please Complete By Expires   Diet - low sodium heart healthy      Call MD / Call 911      Comments:   If you experience chest pain or shortness of breath, CALL 911 and be transported to the hospital emergency room.  If you develope a fever above 101 F, pus (white drainage) or increased drainage or redness at the wound, or calf pain, call your surgeon's office.   Constipation Prevention      Comments:   Drink plenty of fluids.  Prune juice may be helpful.  You may use a stool softener, such as Colace (over the counter) 100 mg twice a day.  Use MiraLax (over the counter) for constipation as needed.   Increase activity slowly as tolerated         Follow-up Information    Follow up with Velna Ochs, MD. Call in 2 weeks.   Contact information:   [address] [place] [address] [phone]           Signed: Prince Rome 04/19/2012, 9:49 AM

## 2012-04-19 NOTE — Progress Notes (Signed)
Physical Therapy Treatment Patient Details Name: [name] MRN: [id number] DOB: 03-10-1951 Today's Date: 04/19/2012 Time: 4010-2725 PT Time Calculation (min): 40 min  PT Assessment / Plan / Recommendation Comments on Treatment Session  Patient continues to make improvements in activity tolerance and effort. Patient progressing modestly towards PT goals at this time. Cognition appears at baseline with no significant deficits compared to prior sessions. Feel patient will benefit from skilled PT upon discharge.     Follow Up Recommendations  SNF     Does the patient have the potential to tolerate intense rehabilitation     Barriers to Discharge        Equipment Recommendations  None recommended by PT    Recommendations for Other Services    Frequency 7X/week   Plan Discharge plan remains appropriate    Precautions / Restrictions Precautions Precautions: Fall Restrictions Weight Bearing Restrictions: Yes LLE Weight Bearing: Weight bearing as tolerated   Pertinent Vitals/Pain 4/10    Mobility  Bed Mobility Bed Mobility: Supine to Sit;Sitting - Scoot to [place] of Bed;Rolling Right;Rolling Left Rolling Right: 4: Min assist;With rail Rolling Left: 4: Min assist;With rail Supine to Sit: 4: Min assist;With rails;HOB elevated Sitting - Scoot to [place] of Bed: 4: Min assist Details for Bed Mobility Assistance: Assist for LE; and assist to initiatie trunk to sitting Transfers Transfers: Sit to Stand;Stand to Sit;Squat Pivot Transfers Sit to Stand: 2: Max assist;From bed;From elevated surface;With upper extremity assist Stand to Sit: 2: Max assist;To bed Squat Pivot Transfers: 2: Max assist;From elevated surface Details for Transfer Assistance: Max Verbal cues for hand placement and educated on feet position and momentum use (rocking technique)  Ambulation/Gait Ambulation/Gait Assistance: Not tested (comment)    Exercises Total Joint Exercises Ankle Circles/Pumps: AROM;Both;10  reps;Supine Quad Sets: AAROM;Seated;10 reps Short Arc Quad: AAROM;Seated;10 reps Hip ABduction/ADduction: AAROM;Seated;10 reps Straight Leg Raises: AAROM;Seated;10 reps Long Arc Quad: AAROM;Seated;10 reps    PT Goals Acute Rehab PT Goals PT Goal: Sit at [place] Of Bed - Progress: Met PT Goal: Sit to Stand - Progress: Progressing toward goal PT Goal: Stand to Sit - Progress: Progressing toward goal PT Goal: Ambulate - Progress: Not progressing  Visit Information  Last PT Received On: 04/19/12 Assistance Needed: +2    Subjective Data  Subjective: Patient very pleasent and motivated to participate today Patient Stated Goal: to walk again   Cognition  Overall Cognitive Status: Appears within functional limits for tasks assessed/performed Arousal/Alertness: Awake/alert Orientation Level: Appears intact for tasks assessed Behavior During Session: [facility] for tasks performed    Balance  Static Sitting Balance Static Sitting - Balance Support: No upper extremity supported Static Sitting - Level of Assistance: 6: Modified independent (Device/Increase time) Static Sitting - Comment/# of Minutes: Sat EOB unsupported with no need for trunk assist; significant improvement over prior sessions Static Standing Balance Static Standing - Balance Support: Bilateral upper extremity supported Static Standing - Level of Assistance: 2: Max assist Static Standing - Comment/# of Minutes: 45 seconds; with good control, limited by dizziness and anxiety  End of Session PT - End of Session Equipment Utilized During Treatment: Gait belt Activity Tolerance: Patient tolerated treatment well Patient left: in chair;with call bell/phone within reach;with family/visitor present Nurse Communication: Mobility status   GP     Fabio Asa 04/19/2012, 9:50 AM Charlotte Crumb, PT DPT  [phone]

## 2012-04-19 NOTE — Progress Notes (Signed)
Occupational Therapy Treatment Patient Details Name: Arian Mcquitty MRN: [id number] DOB: 11-25-1950 Today's Date: 04/19/2012 Time: 4098-1191 OT Time Calculation (min): 25 min  OT Assessment / Plan / Recommendation Comments on Treatment Session Pt's cognition allowing for much more education to take place today.  Pt very motivated, attended and particpated well. Chart states [employer] will not pay for CIR d/c plan due to admitting diagnosis of TKA.     Follow Up Recommendations  SNF    Barriers to Discharge       Equipment Recommendations  Wheelchair (measurements OT);Wheelchair cushion (measurements OT)    Recommendations for Other Services    Frequency Min 3X/week   Plan Discharge plan needs to be updated    Precautions / Restrictions Precautions Precautions: Fall Restrictions Weight Bearing Restrictions: Yes LLE Weight Bearing: Weight bearing as tolerated   Pertinent Vitals/Pain Pt with 6/10 pain at beginning of treatment.  10/10 at end.  Nursing aware.  Pt premedicated before treatment.  Vitals stable.     ADL  Grooming: Performed;Set up Where Assessed - Grooming: Supported sitting Upper Body Bathing: Performed;Set up Where Assessed - Upper Body Bathing: Supported sitting Lower Body Bathing: Performed;Maximal assistance Where Assessed - Lower Body Bathing: Supported sit to stand Equipment Used: Gait belt;Rolling walker Transfers/Ambulation Related to ADLs: Pt transferred sit to stand 3 different times.  On last attempt pt did get to full standing with assist given on pts R side.  Other two attempts assist given on L side and pt got to squat position but unable to fully stand. ADL Comments: Pt struggles with LE adls.  Will bring in AE on next visit.    OT Diagnosis:    OT Problem List:   OT Treatment Interventions:     OT Goals Acute Rehab OT Goals OT Goal Formulation: With patient Time For Goal Achievement: 04/28/12 Potential to Achieve Goals: Good ADL Goals Pt  Will Perform Upper Body Bathing: with set-up;with supervision;Supported;Sitting, chair ADL Goal: Upper Body Bathing - Progress: Met Pt Will Transfer to Toilet: with mod assist;Squat pivot transfer;with DME;3-in-1;with cueing (comment type and amount) ADL Goal: Toilet Transfer - Progress: Progressing toward goals Pt Will Perform Toileting - Clothing Manipulation: with mod assist;Sitting on 3-in-1 or toilet;Rolling right and/or left ADL Goal: Toileting - Clothing Manipulation - Progress: Progressing toward goals Pt Will Perform Toileting - Hygiene: with mod assist;Leaning right and/or left on 3-in-1/toilet;with cueing (comment type and amount) ADL Goal: Toileting - Hygiene - Progress: Progressing toward goals Additional ADL Goal #1: Maintain upright posture EOB X 10 min  with min A in preparation for ADL retarining. ADL Goal: Additional Goal #1 - Progress: Met Additional ADL Goal #2: Complete bed mobility with min A in preparation for ADL.  Visit Information  Last OT Received On: 04/19/12 Assistance Needed: +2    Subjective Data      Prior Functioning       Cognition  Overall Cognitive Status: Appears within functional limits for tasks assessed/performed Arousal/Alertness: Awake/alert Orientation Level: Appears intact for tasks assessed Behavior During Session: [facility] for tasks performed Cognition - Other Comments: Pt's cognition appears back to baseline today.  Problem solving, ,memory and insight appear intact today.    Mobility  Shoulder Instructions Bed Mobility Bed Mobility: Supine to Sit;Sitting - Scoot to [place] of Bed;Rolling Right;Rolling Left Rolling Right: 4: Min assist;With rail Rolling Left: 4: Min assist;With rail Supine to Sit: 4: Min assist;With rails;HOB elevated Sitting - Scoot to [place] of Bed: 4: Min assist Details for Bed  Mobility Assistance: Assist for LE; and assist to initiatie trunk to sitting Transfers Transfers: Sit to Stand;Stand to Sit Sit to Stand: 1: +1  Total assist;With upper extremity assist;From chair/3-in-1;With armrests Sit to Stand: Patient Percentage: 30% Stand to Sit: 2: Max assist;With upper extremity assist;With armrests;To chair/3-in-1 Details for Transfer Assistance: Max Verbal cues for hand placement and educated on feet position and momentum use (rocking technique)        Exercises  Total Joint Exercises Ankle Circles/Pumps: AROM;Both;10 reps;Supine Quad Sets: AAROM;Seated;10 reps Short Arc Quad: AAROM;Seated;10 reps Hip ABduction/ADduction: AAROM;Seated;10 reps Straight Leg Raises: AAROM;Seated;10 reps Long Arc Quad: AAROM;Seated;10 reps   Balance Static Sitting Balance Static Sitting - Balance Support: No upper extremity supported Static Sitting - Level of Assistance: 6: Modified independent (Device/Increase time) Static Sitting - Comment/# of Minutes: Sat EOB unsupported with no need for trunk assist; significant improvement over prior sessions Static Standing Balance Static Standing - Balance Support: Bilateral upper extremity supported Static Standing - Level of Assistance: 2: Max assist Static Standing - Comment/# of Minutes: 45 seconds; with good control, limited by dizziness and anxiety   End of Session OT - End of Session Activity Tolerance: Patient tolerated treatment well Patient left: in chair;with call bell/phone within reach;with family/visitor present Nurse Communication: Mobility status;Precautions;Other (comment)  GO     Hope Budds [phone] 04/19/2012, 11:32 AM

## 2012-04-19 NOTE — Clinical Social Work Note (Signed)
Clinical Social Worker followed up with patient and her husband Mr. Brookover who was at bedside and reported that [facility] was preferred. Daughter reported that she is discussing with Wille Celeste, admissions coordinator at the requested facility regarding preference for private room. CSW left voice message with Wille Celeste, to see about their availability. CSW discussed option of possibility of semi-private room and if they would still prefer facility and husband reported that they will see what their options are after Wille Celeste discusses with patient's daughter their availability status. CSW will continue to follow.   Rozetta Nunnery MSW, [employer] [phone]

## 2012-04-20 LAB — PROTIME-INR
INR: 2.56 — ABNORMAL HIGH (ref 0.00–1.49)
Prothrombin Time: 26.3 seconds — ABNORMAL HIGH (ref 11.6–15.2)

## 2012-04-20 MED ORDER — WARFARIN SODIUM 5 MG PO TABS
5.0000 mg | ORAL_TABLET | Freq: Once | ORAL | Status: DC
  Filled 2012-04-20: qty 1

## 2012-04-20 NOTE — Progress Notes (Signed)
Pt discharged in stable condition via ambulance to SNF.  Discharge packet sent with pt via EMS.  [name]

## 2012-04-20 NOTE — Progress Notes (Signed)
Report called to Amy, LPN at [facility].  Hector Shade [place]

## 2012-04-20 NOTE — Clinical Social Work Note (Signed)
Clinical Child psychotherapist received [place]. CSW will facilitate discharge to [facility]. CSW will complete discharge packet. CSW will sign off, as social work intervention is no longer needed.   Rozetta Nunnery MSW, [employer] [phone]

## 2012-04-20 NOTE — Progress Notes (Signed)
Subjective: 7 Days Post-Op Procedure(s) (LRB): TOTAL KNEE ARTHROPLASTY WITH REVISION COMPONENTS (Left)  Activity level:  wbat Diet tolerance:  ok Voiding:  ok Patient reports pain as 4 on 0-10 scale.    Objective: Vital signs in last 24 hours: Temp:  [97.7 F (36.5 C)-99.6 F (37.6 C)] 98.7 F (37.1 C) (01/14 0540) Pulse Rate:  [94-97] 94  (01/14 0540) Resp:  [18-20] 18  (01/14 0540) BP: (119-148)/(47-73) 119/47 mmHg (01/14 0540) SpO2:  [94 %-98 %] 97 % (01/14 0540)  Labs: No results found for this basename: HGB:5 in the last 72 hours No results found for this basename: WBC:2,RBC:2,HCT:2,PLT:2 in the last 72 hours No results found for this basename: NA:2,K:2,CL:2,CO2:2,BUN:2,CREATININE:2,GLUCOSE:2,CALCIUM:2 in the last 72 hours  Basename 04/20/12 0520 04/19/12 0710  LABPT -- --  INR 2.56* 3.47*    Physical Exam:  Neurologically intact ABD soft Neurovascular intact Sensation intact distally Intact pulses distally Dorsiflexion/Plantar flexion intact Incision: dressing C/D/I and scant drainage No cellulitis present Compartment soft  Assessment/Plan:  7 Days Post-Op Procedure(s) (LRB): TOTAL KNEE ARTHROPLASTY WITH REVISION COMPONENTS (Left) Advance diet Up with therapy D/C IV fluids Discharge to SNF ---Making progress with therapy    Yvonnie Schinke R 04/20/2012, 9:43 AM

## 2012-04-20 NOTE — Progress Notes (Signed)
ANTICOAGULATION CONSULT NOTE - Follow Up Consult  Pharmacy Consult for Coumadin Indication: VTE prophylaxis  Allergies  Allergen Reactions  . Ivp Dye (Iodinated Diagnostic Agents) Other (See Comments)    "hot and sweaty and almost passed out"  . Aspirin Hives  . Sulfa Antibiotics Hives    Patient Measurements: Height: 5\' 3"  (160 cm) Weight: 210 lb (95.255 kg) IBW/kg (Calculated) : 52.4   Vital Signs: Temp: 98.7 F (37.1 C) (01/14 0540) Temp src: Oral (01/14 0540) BP: 119/47 mmHg (01/14 0540) Pulse Rate: 94  (01/14 0540)  Labs:  Basename 04/20/12 0520 04/19/12 0710 04/18/12 0640  HGB -- -- --  HCT -- -- --  PLT -- -- --  APTT -- -- --  LABPROT 26.3* 32.9* 35.4*  INR 2.56* 3.47* 3.83*  HEPARINUNFRC -- -- --  CREATININE -- -- --  CKTOTAL -- -- --  CKMB -- -- --  TROPONINI -- -- --    Estimated Creatinine Clearance: 65.2 ml/min (by C-G formula based on Cr of 0.97).   Medications:  Prescriptions prior to admission  Medication Sig Dispense Refill  . anastrozole (ARIMIDEX) 1 MG tablet TAKE 1 TABLET BY MOUTH DAILY  30 tablet  5  . Calcium Carbonate-Vitamin D (CALCIUM 600+D) 600-200 MG-UNIT TABS Take 1-2 tablets by mouth 2 (two) times daily. Take 1 tablet in morning and 2 tablets at night      . DULoxetine (CYMBALTA) 60 MG capsule Take 60 mg by mouth daily.        . folic acid (FOLVITE) 1 MG tablet Take 1 mg by mouth daily.        . furosemide (LASIX) 40 MG tablet Take 40 mg by mouth daily.        Marland Kitchen levothyroxine (SYNTHROID, LEVOTHROID) 125 MCG tablet Take 125 mcg by mouth daily.        Marland Kitchen lidocaine (XYLOCAINE) 5 % ointment Apply 1 application topically as needed. Using on both knees      . magnesium oxide (MAG-OX) 400 MG tablet Take 400 mg by mouth 2 (two) times daily.        . metoprolol (TOPROL-XL) 100 MG 24 hr tablet Take 100 mg by mouth daily.        Marland Kitchen NIFEdipine (PROCARDIA XL/ADALAT-CC) 30 MG 24 hr tablet Take 30 mg by mouth daily.        Marland Kitchen zolpidem (AMBIEN) 10 MG  tablet Take 10 mg by mouth at bedtime as needed. For sleep      . [DISCONTINUED] diclofenac sodium (VOLTAREN) 1 % GEL Apply 2 g topically 2 (two) times daily as needed. For knees      . [DISCONTINUED] gabapentin (NEURONTIN) 600 MG tablet Take 600 mg by mouth 4 (four) times daily.      . [DISCONTINUED] morphine (MS CONTIN) 60 MG 12 hr tablet Take 60 mg by mouth 2 (two) times daily.      . [DISCONTINUED] oxyCODONE-acetaminophen (PERCOCET) 10-650 MG per tablet Take 1 tablet by mouth every 6 (six) hours as needed. For pain        Assessment: 62 year old woman s/p left total knee replacement continues to receive warfarin for VTE prophylaxis post op. INR therapeutic today and trend down after 2 doses held d/t elevated INR.  H/H and plts are stable. No drug interactions identified.   Goal of Therapy:  INR 2-3 Monitor platelets by anticoagulation protocol: Yes   Plan:  Coumadin 5mg  today, estimate this to be daily dose.  Verlene Mayer, PharmD,  BCPS Pager [phone] 04/20/2012 1:20 PM

## 2012-04-20 NOTE — Progress Notes (Signed)
PHYSICAL THERAPY PROGRESS NOTE    04/20/12 1500  PT Visit Information  Last PT Received On 04/20/12  PT Time Calculation  PT Start Time 1330  PT Stop Time 1354  PT Time Calculation (min) 24 min  Subjective Data  Subjective "I was hoping I'd get to see you again"  Patient Stated Goal to walk again  Restrictions  Weight Bearing Restrictions No  LLE Weight Bearing WBAT  Cognition  Overall Cognitive Status Appears within functional limits for tasks assessed/performed  Arousal/Alertness Awake/alert  Orientation Level Appears intact for tasks assessed  Behavior During Session [facility] for tasks performed  Total Joint Exercises  Ankle Circles/Pumps AROM;Both;10 reps;Supine  Quad Sets AAROM;10 reps  Short Arc Quad AAROM;10 reps  Hip ABduction/ADduction AAROM;10 reps  Straight Leg Raises AAROM;10 reps  Long Arc Quad AAROM;10 reps  PT - End of Session  Equipment Utilized During Treatment (handout for HEP)  Activity Tolerance Patient limited by pain  Patient left in bed;with call bell/phone within reach;with family/visitor present  PT - Assessment/Plan  Comments on Treatment Session Patient provided handout and educated on technique for HEP for TKA.  Patient able to perform ther-ex with assist.  Expressed importance of ROM and strength during initial recovery stage.  Discussed with patient and son expectations for mobility and activity as well as precautions to ensure maximal gains during recovery. Pt to be d/c to SNF. No other questions or concerns for PT at this time.  PT Plan Discharge plan remains appropriate  PT Frequency 7X/week  Follow Up Recommendations SNF  PT equipment None recommended by PT  PT General Charges  $$ ACUTE PT VISIT 1 Procedure  PT Treatments  $Therapeutic Exercise 8-22 mins  $Self Care/Home Management 8-22     Charlotte Crumb, [place] DPT  [phone]

## 2012-04-20 NOTE — Discharge Summary (Signed)
  No change in condition or meds from last d/c summary

## 2012-05-11 ENCOUNTER — Encounter: Payer: Self-pay | Admitting: Oncology

## 2012-05-31 ENCOUNTER — Encounter: Payer: Self-pay | Admitting: Family

## 2012-05-31 ENCOUNTER — Telehealth: Payer: Self-pay | Admitting: Oncology

## 2012-05-31 ENCOUNTER — Ambulatory Visit (HOSPITAL_BASED_OUTPATIENT_CLINIC_OR_DEPARTMENT_OTHER): Payer: Medicare HMO | Admitting: Family

## 2012-05-31 VITALS — BP 134/80 | HR 81 | Temp 98.3°F | Resp 20

## 2012-05-31 DIAGNOSIS — B379 Candidiasis, unspecified: Secondary | ICD-10-CM

## 2012-05-31 DIAGNOSIS — C50319 Malignant neoplasm of lower-inner quadrant of unspecified female breast: Secondary | ICD-10-CM

## 2012-05-31 DIAGNOSIS — C50919 Malignant neoplasm of unspecified site of unspecified female breast: Secondary | ICD-10-CM

## 2012-05-31 DIAGNOSIS — Z170 Estrogen receptor positive status [ER+]: Secondary | ICD-10-CM

## 2012-05-31 MED ORDER — ANASTROZOLE 1 MG PO TABS: 1.0000 mg | ORAL_TABLET | Freq: Every day | ORAL | Status: DC

## 2012-05-31 NOTE — Progress Notes (Signed)
[facility]  [phone]   [phone]  OFFICE PROGRESS NOTE  [name]   DOB: 04-18-1950  MR#: [id number]  JYN#:[id number]   PCP: [name] Real, MD SU: [name] Kelp, MD RAD ONC:  [name] Gunning, MD OTHER MD:  [name] Matin, MD [facility])   HISTORY OF PRESENT ILLNESS: From [name] new patient evaluation dated 02/06/2010:    "This is a delightful 62 year old woman from [facility] referred by Dr. Derrell [name] for evaluation and treatment of breast cancer.  She is here today with her family.  This woman has been in good health.  She has undergone annual screening mammography.  She has also been in reasonably good health.  She had a mammogram performed on 11/25/2009 as well as 12/03/2009.  These showed potential abnormality in the right breast.  Focal density was noted medially.  No other abnormalities were seen.  Right breast ultrasound showed an irregular hypoechoic mass measuring 7 x 7.5 x 10 located at 4 o'clock 3 cm from the nipple.  A biopsy was recommended.  Biopsy performed on 12/19/2009 showed invasive ductal cancer associated with DCIS.  This is ER positive 86%, PR positive 41%, proliferative index is 7%, HER-2 is not amplified with the ratio of 1.17.  A preoperative MRI scan was performed on 12/25/2009; this showed a small enhancing mass in the lower inner quadrant of right breast.  This measured 9 x 7 x 11 mm.  Bilateral axillary lymph nodes were visualized, were not pathologically enlarged.  The patient underwent a lumpectomy and sentinel lymph node evaluation on 01/22/2010.  This showed an invasive ductal cancer, grade 1 of 3, measuring 1.1 cm.  Surgical margins were negative.  DCIS was seen focally 0.1 cm from lateral margin.  Two sentinel lymph nodes were identified, both of which were negative for malignancy.  The patient has had an unremarkable postoperative course."  Her subsequent history is as detailed below.  INTERVAL HISTORY: [name] returns today for follow up of her breast cancer. She is accompanied by her husband [name] for today's office visit. Since her last visit on 09/29/2011, she underwent total left knee replacement on 04/12/2012 and is still recovering from her surgery. She is a temporary resident at [facility] at the present time. She is establishing herself with Dr. Darrall [name] service today.   REVIEW OF SYSTEMS: A 10 point review of systems was completed and is negative  except for chronic eye tearing, postoperative pain and occasional urine incontinence.  She denies all of the symptomatology.   PAST MEDICAL HISTORY: Past Medical History  Diagnosis Date  . Hypertension   . Difficult intubation     Fastrach #4 LMA then # 7 ETT used in 2006 [facility], cervical laminectomy)  . Hypothyroidism   . Cervical neuropathy     PERIPHERAL NEUROPATHY , HAD CERVICAL FUSION  . PONV (postoperative nausea and vomiting)   . Hypercholesteremia   . Kidney stones 1990's    "twice; passed in hospital on my own" (04/13/2012)  . Arthritis     "all over" (04/13/2012)  . Breast cancer 2011    "right" (04/13/2012)    PAST SURGICAL HISTORY: Past Surgical History  Procedure Laterality Date  . [place] luc  ~ 2003    "benign tumor removed from up under gum" (04/13/2012)  . Posterior cervical laminectomy  2006  . Eye surgery  2004    STENTS TO BIL EYES  . Tonsillectomy and adenoidectomy  ~ 1954  .  Abdominal hysterectomy  ~ 1976  . Breast lumpectomy  2011    "right" (04/13/2012)  . Dacrocystorhinostomy  ~ 2000    "put stents in my tear ducts; both eyes" (04/13/2012)  . Total knee arthroplasty with revision components  04/13/2012    Procedure: TOTAL KNEE ARTHROPLASTY WITH REVISION COMPONENTS;  Surgeon: [name] Ochs, MD;  Location: [facility] OR;  Service: Orthopedics;  Laterality: Left;    FAMILY HISTORY Family History  Problem Relation Age of Onset  . Heart disease Father     GYNECOLOGIC HISTORY:   She is G 2, P  2, menarche age 56, had menopause at the time of hysterectomy (ovaries retained).  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  She previously worked at the [employer], discontinued working over 10 years ago and has taught at [employer]  She has two adult children.  She is on disability.   ADVANCED DIRECTIVES: Not on file.   HEALTH MAINTENANCE: History  Substance Use Topics  . Smoking status: Light Tobacco Smoker -- 0.25 packs/day for 48 years    Types: Cigarettes  . Smokeless tobacco: Never Used  . Alcohol Use: No     Colonoscopy: The patient has never had a colonoscopy.   PAP:  The patient states her last Pap smear was 2 years ago.  Bone density: The patient states her last bone density exam was 2 years ago.  Lipid panel:  Not on file.  Allergies  Allergen Reactions  . Ivp Dye (Iodinated Diagnostic Agents) Other (See Comments)    "hot and sweaty and almost passed out"  . Aspirin Hives  . Sulfa Antibiotics Hives    Current Outpatient Prescriptions  Medication Sig Dispense Refill  . anastrozole (ARIMIDEX) 1 MG tablet Take 1 tablet (1 mg total) by mouth daily.  90 tablet  4  . Calcium Carbonate-Vitamin D (CALCIUM 600+D) 600-200 MG-UNIT TABS Take 3 tablets by mouth at bedtime. 1 tab am and 2 tabs QHS      . Chlorhexidine Gluconate Cloth 2 % PADS Apply 6 each topically daily at 6 (six) AM.  24 each  0  . DULoxetine (CYMBALTA) 60 MG capsule Take 60 mg by mouth daily.        . folic acid (FOLVITE) 1 MG tablet Take 1 mg by mouth daily.        . furosemide (LASIX) 40 MG tablet Take 40 mg by mouth daily.        Marland Kitchen gabapentin (NEURONTIN) 300 MG capsule Take 2 capsules (600 mg total) by mouth 4 (four) times daily.  80 capsule  1  . iron polysaccharides (NU-IRON) 150 MG capsule Take 150 mg by mouth 2 (two) times daily.      Marland Kitchen levothyroxine (SYNTHROID, LEVOTHROID) 125 MCG tablet Take 125 mcg by mouth daily.        Marland Kitchen lidocaine (XYLOCAINE)  5 % ointment Apply 1 application topically as needed. Using on both knees      . magnesium oxide (MAG-OX) 400 MG tablet Take 400 mg by mouth 2 (two) times daily.        . methocarbamol (ROBAXIN) 500 MG tablet Take 1 tablet (500 mg total) by mouth every 6 (six) hours as needed.  60 tablet  0  . metoprolol (TOPROL-XL) 100 MG 24 hr tablet Take 100 mg by mouth daily.        Marland Kitchen morphine (MS CONTIN) 60 MG 12 hr tablet  Take 1 tablet (60 mg total) by mouth 2 (two) times daily.  60 tablet  0  . NIFEdipine (PROCARDIA XL/ADALAT-CC) 30 MG 24 hr tablet Take 30 mg by mouth daily.        Marland Kitchen oxyCODONE-acetaminophen (PERCOCET) 10-650 MG per tablet Take 1 tablet by mouth every 6 (six) hours as needed. For pain  60 tablet  0  . polyethylene glycol (MIRALAX / GLYCOLAX) packet Take 17 g by mouth daily.      Marland Kitchen zolpidem (AMBIEN) 10 MG tablet Take 10 mg by mouth at bedtime as needed. For sleep       No current facility-administered medications for this visit.    OBJECTIVE: Filed Vitals:   05/31/12 1051  BP: 134/80  Pulse: 81  Temp: 98.3 F (36.8 C)  Resp: 20     Body mass index is 0.00 kg/(m^2).      ECOG FS:  Grade 2- Symptomatic but capable limited self-care  General appearance: Alert, cooperative, well nourished, no apparent distress, seated in a wheelchair Head: Normocephalic, without obvious abnormality, atraumatic Eyes: Conjunctivae/corneas clear, PERRLA, EOMI, chronic lachrymal duct leaking Nose: Nares, septum and mucosa are normal, no drainage or sinus tenderness Neck: No adenopathy, supple, symmetrical, trachea midline, thyroid not enlarged, no tenderness Resp: Clear to auscultation bilaterally Cardio: Regular rate and rhythm, S1, S2 normal, no murmur, click, rub or gallop Breasts: Pendulous bilaterally, cutaneous candidiasis bilaterally, right breast is smaller similar left breast, well-healed surgical scar at the 6:00 position, visible radiation changes, no lymphadenopathy, no nipple inversion, no  axilla fullness, benign breast exam GI: Soft, distended, non-tender, hypoactive bowel sounds, no organomegaly from a seated position Extremities: Extremities normal, atraumatic, no cyanosis or edema Lymph nodes: Cervical, supraclavicular, and axillary nodes normal Neurologic: Grossly normal    LAB RESULTS: Lab Results  Component Value Date   WBC 8.3 04/16/2012   NEUTROABS 2.3 04/12/2012   HGB 9.6* 04/16/2012   HCT 28.8* 04/16/2012   MCV 102.5* 04/16/2012   PLT 153 04/16/2012      Chemistry      Component Value Date/Time   NA 140 04/16/2012 0630   NA 144 04/02/2012 1150   K 3.4* 04/16/2012 0630   K 3.8 04/02/2012 1150   CL 103 04/16/2012 0630   CL 103 04/02/2012 1150   CO2 27 04/16/2012 0630   CO2 28 04/02/2012 1150   BUN 18 04/16/2012 0630   BUN 14.0 04/02/2012 1150   CREATININE 0.97 04/16/2012 0630   CREATININE 1.0 04/02/2012 1150      Component Value Date/Time   CALCIUM 8.7 04/16/2012 0630   CALCIUM 9.6 04/02/2012 1150   ALKPHOS 86 04/02/2012 1150   ALKPHOS 68 09/29/2011 1322   AST 47* 04/02/2012 1150   AST 18 09/29/2011 1322   ALT 37 04/02/2012 1150   ALT 17 09/29/2011 1322   BILITOT 0.40 04/02/2012 1150   BILITOT 0.4 09/29/2011 1322      Lab Results  Component Value Date   LABCA2 18 02/17/2011   Urinalysis    Component Value Date/Time   COLORURINE AMBER* 04/15/2012 2324   APPEARANCEUR TURBID* 04/15/2012 2324   LABSPEC 1.024 04/15/2012 2324   PHURINE 6.0 04/15/2012 2324   GLUCOSEU NEGATIVE 04/15/2012 2324   HGBUR MODERATE* 04/15/2012 2324   BILIRUBINUR NEGATIVE 04/15/2012 2324   KETONESUR NEGATIVE 04/15/2012 2324   PROTEINUR 100* 04/15/2012 2324   UROBILINOGEN 1.0 04/15/2012 2324   NITRITE NEGATIVE 04/15/2012 2324   LEUKOCYTESUR NEGATIVE 04/15/2012 2324    STUDIES: No  results found.   ASSESSMENT: 62 y.o.  [place], [place] woman:  1. Status post right breast lumpectomy with sentinel biopsy on 01/22/2010 for, stage I, T1c N0, invasive ductal carcinoma, grade 1, ER 86%, PR 41%, Ki-67  7%, HER-2 new no amplification with a 1.17 ratio.  2. Status post radiation therapy from 03/18/2010 through 05/07/2010.  3. Status post Oncotype DX with a score of 15% and an average rate of distant recurrence of 10%.  4. She has been taking Arimidex since 05/2010.   PLAN: Mrs. Neyman is doing well from her breast cancer point of view. The patient was asked to inquire about receiving nystatin powder while at the [facility] for bilateral cutaneous candidiasis under her breasts.The patient states that she may undergo a right total knee replacement surgery in the near future. The patient was given a prescription for Arimidex #90 with 4 refills today.  We plan to see her again in 6 months, at which time we will collect a CMP, CBC, and vitamin D level. The patient is also scheduled to receive a bone density scan before her next office visit.   All questions were answered.  The patient was encouraged to contact us in the interim with any problems, questions or concerns.   Larina Bras, NP-C 05/31/2012, 7:06 PM

## 2012-05-31 NOTE — Patient Instructions (Addendum)
Please contact us at [phone] if you have any questions or concerns.  Need Nystatin powder 100,000 units to be applied three times daily under each breast.

## 2012-05-31 NOTE — Telephone Encounter (Signed)
appts made and ptrinted for t  annep

## 2012-09-08 ENCOUNTER — Telehealth: Payer: Self-pay | Admitting: *Deleted

## 2012-09-08 NOTE — Telephone Encounter (Signed)
sw pt informed her that [facility] will be in [facility] during the am. Made her aware that her lab appt for 12/01/12 was changed to 2pm on the same day and that her ov would be @ 2:30pm. She is aware that i will mail a letter/cal as well...td

## 2012-09-29 ENCOUNTER — Other Ambulatory Visit: Payer: Self-pay | Admitting: Oncology

## 2012-09-29 DIAGNOSIS — C50919 Malignant neoplasm of unspecified site of unspecified female breast: Secondary | ICD-10-CM

## 2012-11-30 ENCOUNTER — Telehealth: Payer: Self-pay | Admitting: *Deleted

## 2012-11-30 NOTE — Telephone Encounter (Signed)
Pt called stating that shes having a hard time walking.  She cancel her appts for 12/01/12 and rs. gv appt for 12/22/12 w/ labs @ 12:30p and ov @ 1pm. Pt is aware...td

## 2012-12-01 ENCOUNTER — Other Ambulatory Visit: Payer: Medicare HMO | Admitting: Lab

## 2012-12-01 ENCOUNTER — Ambulatory Visit: Payer: Medicare HMO | Admitting: Oncology

## 2012-12-22 ENCOUNTER — Ambulatory Visit (HOSPITAL_BASED_OUTPATIENT_CLINIC_OR_DEPARTMENT_OTHER): Payer: Medicare HMO | Admitting: Oncology

## 2012-12-22 ENCOUNTER — Other Ambulatory Visit (HOSPITAL_BASED_OUTPATIENT_CLINIC_OR_DEPARTMENT_OTHER): Payer: Medicare HMO

## 2012-12-22 ENCOUNTER — Telehealth: Payer: Self-pay | Admitting: Oncology

## 2012-12-22 VITALS — BP 144/74 | HR 82 | Temp 98.0°F | Resp 18

## 2012-12-22 DIAGNOSIS — C50919 Malignant neoplasm of unspecified site of unspecified female breast: Secondary | ICD-10-CM

## 2012-12-22 DIAGNOSIS — C50319 Malignant neoplasm of lower-inner quadrant of unspecified female breast: Secondary | ICD-10-CM

## 2012-12-22 DIAGNOSIS — C50311 Malignant neoplasm of lower-inner quadrant of right female breast: Secondary | ICD-10-CM | POA: Insufficient documentation

## 2012-12-22 LAB — CBC WITH DIFFERENTIAL/PLATELET
Eosinophils Absolute: 0.1 10*3/uL (ref 0.0–0.5)
HCT: 40 % (ref 34.8–46.6)
LYMPH%: 32.1 % (ref 14.0–49.7)
MONO#: 0.5 10*3/uL (ref 0.1–0.9)
NEUT#: 2.6 10*3/uL (ref 1.5–6.5)
Platelets: 191 10*3/uL (ref 145–400)
RBC: 4.05 10*6/uL (ref 3.70–5.45)
WBC: 4.7 10*3/uL (ref 3.9–10.3)
lymph#: 1.5 10*3/uL (ref 0.9–3.3)

## 2012-12-22 LAB — COMPREHENSIVE METABOLIC PANEL (CC13)
Albumin: 3.6 g/dL (ref 3.5–5.0)
Alkaline Phosphatase: 89 U/L (ref 40–150)
BUN: 14.5 mg/dL (ref 7.0–26.0)
Glucose: 131 mg/dl (ref 70–140)
Potassium: 3.7 mEq/L (ref 3.5–5.1)

## 2012-12-22 MED ORDER — ANASTROZOLE 1 MG PO TABS: 1.0000 mg | ORAL_TABLET | Freq: Every day | ORAL | Status: DC

## 2012-12-22 NOTE — Progress Notes (Signed)
Patient [name], female   DOB: 09-Oct-1950, 62 y.o.   MRN: [id number] [name]  [phone]   [phone]  OFFICE PROGRESS NOTE  [name]   DOB: Aug 12, 1950  MR#: [id number]  ZHY#:[id number]   PCP: [name] Real, MD SU: [name] Kelp, MD RAD ONC:  [name] Gunning, MD OTHER MD:  [name]   HISTORY OF PRESENT ILLNESS: From [name] new patient evaluation dated 02/06/2010:    "This is a delightful 62 year old woman from [facility] referred by [name] for evaluation and treatment of breast cancer.  She is here today with her family.  This woman has been in good health.  She has undergone annual screening mammography.  She has also been in reasonably good health.  She had a mammogram performed on 11/25/2009 as well as 12/03/2009.  These showed potential abnormality in the right breast.  Focal density was noted medially.  No other abnormalities were seen.  Right breast ultrasound showed an irregular hypoechoic mass measuring 7 x 7.5 x 10 located at 4 o'clock 3 cm from the nipple.  A biopsy was recommended.  Biopsy performed on 12/19/2009 showed invasive ductal cancer associated with DCIS.  This is ER positive 86%, PR positive 41%, proliferative index is 7%, HER-2 is not amplified with the ratio of 1.17.  A preoperative MRI scan was performed on 12/25/2009; this showed a small enhancing mass in the lower inner quadrant of right breast.  This measured 9 x 7 x 11 mm.  Bilateral axillary lymph nodes were visualized, were not pathologically enlarged.  The patient underwent a lumpectomy and sentinel lymph node evaluation on 01/22/2010.  This showed an invasive ductal cancer, grade 1 of 3, measuring 1.1 cm.  Surgical margins were negative.  DCIS was seen focally 0.1 cm from lateral margin.  Two sentinel lymph nodes were identified, both of which were negative for malignancy.  The patient has had an unremarkable postoperative course."  Her subsequent  history is as detailed below.  INTERVAL HISTORY: [name] returns today for follow up of her breast cancer accompanied by her husband [name]. The patient is tolerating the anastrozole with no side effects that she is aware of, and particularly no significant concerns regarding hot flashes, which are mild, vaginal dryness, or arthralgias/myalgias, which are present but not more intense or persistent than before she started anastrozole.Marland Kitchen   REVIEW OF SYSTEMS: Her biggest problem remains quadriceps weakness, and she is in a wheelchair all the time. She doesn't use her walker. It is hard for her to get around, as a result, and so she had to postpone her mammogram. She tells me it is rescheduled for later this month. She thinks she needs to get her eyes checked. There have been no unusual headaches, other visual changes, dizziness, gait imbalance, cough, phlegm production, pleurisy, shortness of breath, chest pain and pressure, or change in bowel or bladder habits. She isn't moderately constipated due to her narcotics but she deals with that by using Dulcolax. A detailed review of systems today was otherwise noncontributory  PAST MEDICAL HISTORY: Past Medical History  Diagnosis Date  . Hypertension   . Difficult intubation     Fastrach #4 LMA then # 7 ETT used in 2006 [facility], cervical laminectomy)  . Hypothyroidism   . Cervical neuropathy     PERIPHERAL NEUROPATHY , HAD CERVICAL FUSION  . PONV (postoperative nausea and vomiting)   . Hypercholesteremia   . Kidney stones 1990's    "twice;  passed in hospital on my own" (04/13/2012)  . Arthritis     "all over" (04/13/2012)  . Breast cancer 2011    "right" (04/13/2012)    PAST SURGICAL HISTORY: Past Surgical History  Procedure Laterality Date  . Caldwell luc  ~ 2003    "benign tumor removed from up under gum" (04/13/2012)  . Posterior cervical laminectomy  2006  . Eye surgery  2004    STENTS TO BIL EYES  . Tonsillectomy and adenoidectomy  ~ 1954   . Abdominal hysterectomy  ~ 1976  . Breast lumpectomy  2011    "right" (04/13/2012)  . Dacrocystorhinostomy  ~ 2000    "put stents in my tear ducts; both eyes" (04/13/2012)  . Total knee arthroplasty with revision components  04/13/2012    Procedure: TOTAL KNEE ARTHROPLASTY WITH REVISION COMPONENTS;  Surgeon: Hessie Dibble, MD;  Location: [facility];  Service: Orthopedics;  Laterality: Left;    FAMILY HISTORY Family History  Problem Relation Age of Onset  . Heart disease Father     GYNECOLOGIC HISTORY:   She is G 2, P 2, menarche age 62, had menopause at the time of hysterectomy (ovaries retained).  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  She previously worked at the [employer], discontinued working over 10 years ago and has taught at [employer].  She became disabled after her next surgery in 2006. The patient's husband, [name], used to own a [employer] and later owned a Armed forces operational officer. He is now retired. Is just the 2 of them at home. She has two adult children, [name] who lives in the [place] and [name] lives in [place]. Both work with disabled children..  She is on disability.   ADVANCED DIRECTIVES: Not on file.   HEALTH MAINTENANCE: History  Substance Use Topics  . Smoking status: Light Tobacco Smoker -- 0.25 packs/day for 48 years    Types: Cigarettes  . Smokeless tobacco: Never Used  . Alcohol Use: No     Colonoscopy: The patient has never had a colonoscopy.   PAP:  The patient states her last Pap smear was 2 years ago.  Bone density: March 2014/ [facility]/ normal  Lipid panel:  Not on file.  Allergies  Allergen Reactions  . Ivp Dye [Iodinated Diagnostic Agents] Other (See Comments)    "hot and sweaty and almost passed out"  . Aspirin Hives  . Sulfa Antibiotics Hives    Current Outpatient Prescriptions  Medication Sig Dispense Refill  . anastrozole (ARIMIDEX) 1 MG tablet Take 1 tablet (1 mg total) by mouth  daily.  30 tablet  12  . Calcium Carbonate-Vitamin D (CALCIUM 600+D) 600-200 MG-UNIT TABS Take 3 tablets by mouth at bedtime. 1 tab am and 2 tabs QHS      . Chlorhexidine Gluconate Cloth 2 % PADS Apply 6 each topically daily at 6 (six) AM.  24 each  0  . DULoxetine (CYMBALTA) 60 MG capsule Take 60 mg by mouth daily.        . folic acid (FOLVITE) 1 MG tablet Take 1 mg by mouth daily.        . furosemide (LASIX) 40 MG tablet Take 40 mg by mouth daily.        Marland Kitchen gabapentin (NEURONTIN) 300 MG capsule Take 2 capsules (600 mg total) by mouth 4 (four) times daily.  80 capsule  1  . iron polysaccharides (NU-IRON) 150 MG capsule Take 150  mg by mouth 2 (two) times daily.      Marland Kitchen levothyroxine (SYNTHROID, LEVOTHROID) 125 MCG tablet Take 125 mcg by mouth daily.        Marland Kitchen lidocaine (XYLOCAINE) 5 % ointment Apply 1 application topically as needed. Using on both knees      . magnesium oxide (MAG-OX) 400 MG tablet Take 400 mg by mouth 2 (two) times daily.        . methocarbamol (ROBAXIN) 500 MG tablet Take 1 tablet (500 mg total) by mouth every 6 (six) hours as needed.  60 tablet  0  . metoprolol (TOPROL-XL) 100 MG 24 hr tablet Take 100 mg by mouth daily.        Marland Kitchen NIFEdipine (PROCARDIA XL/ADALAT-CC) 30 MG 24 hr tablet Take 30 mg by mouth daily.        Marland Kitchen oxyCODONE-acetaminophen (PERCOCET) 10-650 MG per tablet Take 1 tablet by mouth every 6 (six) hours as needed. For pain  60 tablet  0  . polyethylene glycol (MIRALAX / GLYCOLAX) packet Take 17 g by mouth daily.      Marland Kitchen zolpidem (AMBIEN) 10 MG tablet Take 10 mg by mouth at bedtime as needed. For sleep       No current facility-administered medications for this visit.    OBJECTIVE: Middle-aged [place] American woman examined in a wheelchair Filed Vitals:   12/22/12 1320  BP: 144/74  Pulse: 82  Temp: 98 F (36.7 C)  Resp: 18     Body mass index is 0.00 kg/(m^2).      ECOG FS:   2- Symptomatic but capable limited self-care  Sclerae unicteric Oropharynx  clear No cervical or supraclavicular adenopathy Lungs no rales or rhonchi Heart regular rate and rhythm Abd benign MSK no focal spinal tenderness, no peripheral edema Neuro: nonfocal, well oriented, positive affect Breasts: The right breast is status post lumpectomy and radiation. Scar tissue can be felt inferiorly around the 12:00 area of the breast. This is not tender and not well-defined. There are no skin changes or nipple changes of concern and no other masslike areas. The right axilla is benign. The left breast is unremarkable.    LAB RESULTS: Lab Results  Component Value Date   WBC 4.7 12/22/2012   NEUTROABS 2.6 12/22/2012   HGB 13.7 12/22/2012   HCT 40.0 12/22/2012   MCV 98.7 12/22/2012   PLT 191 12/22/2012      Chemistry      Component Value Date/Time   NA 141 12/22/2012 1215   NA 140 04/16/2012 0630   K 3.7 12/22/2012 1215   K 3.4* 04/16/2012 0630   CL 103 04/16/2012 0630   CL 103 04/02/2012 1150   CO2 26 12/22/2012 1215   CO2 27 04/16/2012 0630   BUN 14.5 12/22/2012 1215   BUN 18 04/16/2012 0630   CREATININE 0.9 12/22/2012 1215   CREATININE 0.97 04/16/2012 0630      Component Value Date/Time   CALCIUM 9.8 12/22/2012 1215   CALCIUM 8.7 04/16/2012 0630   ALKPHOS 89 12/22/2012 1215   ALKPHOS 68 09/29/2011 1322   AST 30 12/22/2012 1215   AST 18 09/29/2011 1322   ALT 27 12/22/2012 1215   ALT 17 09/29/2011 1322   BILITOT 0.38 12/22/2012 1215   BILITOT 0.4 09/29/2011 1322      Lab Results  Component Value Date   LABCA2 18 02/17/2011   Urinalysis    Component Value Date/Time   COLORURINE AMBER* 04/15/2012 2324   APPEARANCEUR TURBID* 04/15/2012  2324   LABSPEC 1.024 04/15/2012 2324   PHURINE 6.0 04/15/2012 2324   GLUCOSEU NEGATIVE 04/15/2012 2324   HGBUR MODERATE* 04/15/2012 2324   BILIRUBINUR NEGATIVE 04/15/2012 2324   KETONESUR NEGATIVE 04/15/2012 2324   PROTEINUR 100* 04/15/2012 2324   UROBILINOGEN 1.0 04/15/2012 2324   NITRITE NEGATIVE 04/15/2012 2324   LEUKOCYTESUR NEGATIVE 04/15/2012 2324     STUDIES: DEXA scan March 2014 was normal. Repeat mammogram is pending later this month   ASSESSMENT: 62 y.o.  [place] woman:  1. Status post right breast lower inner quadrant lumpectomy with sentinel biopsy on 01/22/2010 for a pT1c pN0,stage IA  invasive ductal carcinoma, grade 1, ER 86%, PR 41%, Ki-67 7%, HER-2 new no amplification with a 1.17 ratio.  2. Status post radiation therapy from 03/18/2010 through 05/07/2010.  3. Oncotype DX score of 15% predicts an average rate of distant recurrence of 10% within 10 years if the patient's only systemic treatment is tamoxifen for 5 years.  4. on Arimidex since 05/2010.   PLAN: She is doing terrific from a breast cancer point of view, tolerating the anastrozole well, with a good DEXA scan this year. Mammogram is pending later this month. I am going to start seeing her on a once a year basis, late September of next year, so she can have her mammogram before that visit. The plan is to continue anastrozole for at least 5 years. She has a good understanding of the overall plan an outer call for any problems that may develop before her next visit.  Lowella Dell, MD   12/22/2012, 1:24 PM

## 2012-12-23 NOTE — Addendum Note (Signed)
Addended by: Laroy Apple E on: 12/23/2012 12:09 PM   Modules accepted: Orders

## 2013-03-23 ENCOUNTER — Other Ambulatory Visit: Payer: Self-pay | Admitting: Physical Medicine and Rehabilitation

## 2013-03-23 DIAGNOSIS — M79604 Pain in right leg: Secondary | ICD-10-CM

## 2013-04-18 ENCOUNTER — Ambulatory Visit
Admission: RE | Admit: 2013-04-18 | Discharge: 2013-04-18 | Disposition: A | Discharge status: Home / Self Care | Payer: Medicare HMO | Source: Ambulatory Visit | Attending: Physical Medicine and Rehabilitation | Admitting: Physical Medicine and Rehabilitation

## 2013-04-18 DIAGNOSIS — M79604 Pain in right leg: Secondary | ICD-10-CM

## 2013-04-18 DIAGNOSIS — M79605 Pain in left leg: Principal | ICD-10-CM

## 2013-06-27 ENCOUNTER — Ambulatory Visit: Payer: Medicare HMO | Admitting: Physical Therapy

## 2013-07-04 ENCOUNTER — Ambulatory Visit: Payer: Medicare HMO | Attending: Internal Medicine

## 2013-07-04 DIAGNOSIS — M2560 Stiffness of unspecified joint, not elsewhere classified: Secondary | ICD-10-CM | POA: Insufficient documentation

## 2013-07-04 DIAGNOSIS — R293 Abnormal posture: Secondary | ICD-10-CM | POA: Insufficient documentation

## 2013-07-04 DIAGNOSIS — R5381 Other malaise: Secondary | ICD-10-CM | POA: Insufficient documentation

## 2013-07-04 DIAGNOSIS — IMO0001 Reserved for inherently not codable concepts without codable children: Secondary | ICD-10-CM | POA: Insufficient documentation

## 2013-07-13 ENCOUNTER — Ambulatory Visit: Payer: Medicare HMO | Attending: Internal Medicine

## 2013-07-13 DIAGNOSIS — M2560 Stiffness of unspecified joint, not elsewhere classified: Secondary | ICD-10-CM | POA: Insufficient documentation

## 2013-07-13 DIAGNOSIS — R5381 Other malaise: Secondary | ICD-10-CM | POA: Insufficient documentation

## 2013-07-13 DIAGNOSIS — R293 Abnormal posture: Secondary | ICD-10-CM | POA: Insufficient documentation

## 2013-07-13 DIAGNOSIS — IMO0001 Reserved for inherently not codable concepts without codable children: Secondary | ICD-10-CM | POA: Insufficient documentation

## 2013-07-15 ENCOUNTER — Ambulatory Visit: Payer: Medicare HMO

## 2013-07-19 ENCOUNTER — Ambulatory Visit: Payer: Medicare HMO

## 2013-07-20 ENCOUNTER — Ambulatory Visit: Payer: Medicare HMO | Admitting: Physical Therapy

## 2013-07-25 ENCOUNTER — Ambulatory Visit: Payer: Medicare HMO

## 2013-07-27 ENCOUNTER — Ambulatory Visit: Payer: Medicare HMO

## 2013-08-01 ENCOUNTER — Ambulatory Visit: Payer: Medicare HMO

## 2013-08-03 ENCOUNTER — Ambulatory Visit: Payer: Medicare HMO

## 2013-08-08 ENCOUNTER — Ambulatory Visit: Payer: Medicare HMO | Attending: Internal Medicine

## 2013-08-08 DIAGNOSIS — R293 Abnormal posture: Secondary | ICD-10-CM | POA: Insufficient documentation

## 2013-08-08 DIAGNOSIS — M2560 Stiffness of unspecified joint, not elsewhere classified: Secondary | ICD-10-CM | POA: Insufficient documentation

## 2013-08-08 DIAGNOSIS — IMO0001 Reserved for inherently not codable concepts without codable children: Secondary | ICD-10-CM | POA: Insufficient documentation

## 2013-08-08 DIAGNOSIS — R5381 Other malaise: Secondary | ICD-10-CM | POA: Insufficient documentation

## 2013-08-10 ENCOUNTER — Ambulatory Visit: Payer: Medicare HMO

## 2013-08-11 ENCOUNTER — Ambulatory Visit: Payer: Medicare HMO

## 2013-08-16 ENCOUNTER — Ambulatory Visit: Payer: Medicare HMO

## 2013-08-18 ENCOUNTER — Ambulatory Visit: Payer: Medicare HMO

## 2013-08-22 ENCOUNTER — Ambulatory Visit: Payer: Medicare HMO

## 2013-08-24 ENCOUNTER — Ambulatory Visit: Payer: Medicare HMO

## 2013-08-30 ENCOUNTER — Ambulatory Visit: Payer: Medicare HMO

## 2013-09-02 ENCOUNTER — Ambulatory Visit: Payer: Medicare HMO

## 2013-09-05 ENCOUNTER — Ambulatory Visit: Payer: Medicare HMO | Attending: Physical Medicine and Rehabilitation

## 2013-09-05 DIAGNOSIS — IMO0001 Reserved for inherently not codable concepts without codable children: Secondary | ICD-10-CM | POA: Insufficient documentation

## 2013-09-05 DIAGNOSIS — M2560 Stiffness of unspecified joint, not elsewhere classified: Secondary | ICD-10-CM | POA: Insufficient documentation

## 2013-09-05 DIAGNOSIS — R5381 Other malaise: Secondary | ICD-10-CM | POA: Insufficient documentation

## 2013-09-05 DIAGNOSIS — R293 Abnormal posture: Secondary | ICD-10-CM | POA: Insufficient documentation

## 2013-09-07 ENCOUNTER — Ambulatory Visit: Payer: Medicare HMO

## 2013-09-13 ENCOUNTER — Ambulatory Visit: Payer: Medicare HMO

## 2013-09-15 ENCOUNTER — Ambulatory Visit: Payer: Medicare HMO

## 2013-09-19 ENCOUNTER — Ambulatory Visit: Payer: Medicare HMO

## 2013-09-21 ENCOUNTER — Ambulatory Visit: Payer: Medicare HMO

## 2013-09-23 ENCOUNTER — Ambulatory Visit: Payer: Medicare HMO

## 2013-09-27 ENCOUNTER — Ambulatory Visit: Payer: Medicare HMO

## 2013-09-30 ENCOUNTER — Ambulatory Visit: Payer: Medicare HMO

## 2013-10-03 ENCOUNTER — Ambulatory Visit: Payer: Medicare HMO

## 2013-10-05 ENCOUNTER — Ambulatory Visit: Payer: Medicare HMO

## 2013-12-21 ENCOUNTER — Other Ambulatory Visit: Payer: Self-pay | Admitting: Emergency Medicine

## 2013-12-21 DIAGNOSIS — C50311 Malignant neoplasm of lower-inner quadrant of right female breast: Secondary | ICD-10-CM

## 2013-12-21 DIAGNOSIS — E559 Vitamin D deficiency, unspecified: Secondary | ICD-10-CM

## 2013-12-22 ENCOUNTER — Other Ambulatory Visit: Payer: Medicare HMO

## 2013-12-22 ENCOUNTER — Encounter: Payer: Medicare HMO | Admitting: Oncology

## 2013-12-22 NOTE — Progress Notes (Signed)
Patient [name], female   DOB: [birth year], 63 y.o.   MRN: [id number] [place]  [phone]   [phone]  OFFICE PROGRESS NOTE  [name]   DOB: 08/05/1950  MR#: [id number]  MPN#:[id number]   PCP: [name] Mariscal, MD SU: [name] Skates, MD RAD ONC:  [name] Gross, MD OTHER MD:  [name]   HISTORY OF PRESENT ILLNESS: From Dr. Julien [name] new patient evaluation dated 02/06/2010:    "This is a delightful 63 year old woman from [facility] referred by Dr. Dalbert [name] for evaluation and treatment of breast cancer.  She is here today with her family.  This woman has been in good health.  She has undergone annual screening mammography.  She has also been in reasonably good health.  She had a mammogram performed on 11/25/2009 as well as 12/03/2009.  These showed potential abnormality in the right breast.  Focal density was noted medially.  No other abnormalities were seen.  Right breast ultrasound showed an irregular hypoechoic mass measuring 7 x 7.5 x 10 located at 4 o'clock 3 cm from the nipple.  A biopsy was recommended.  Biopsy performed on 12/19/2009 showed invasive ductal cancer associated with DCIS.  This is ER positive 86%, PR positive 41%, proliferative index is 7%, HER-2 is not amplified with the ratio of 1.17.  A preoperative MRI scan was performed on 12/25/2009; this showed a small enhancing mass in the lower inner quadrant of right breast.  This measured 9 x 7 x 11 mm.  Bilateral axillary lymph nodes were visualized, were not pathologically enlarged.  The patient underwent a lumpectomy and sentinel lymph node evaluation on 01/22/2010.  This showed an invasive ductal cancer, grade 1 of 3, measuring 1.1 cm.  Surgical margins were negative.  DCIS was seen focally 0.1 cm from lateral margin.  Two sentinel lymph nodes were identified, both of which were negative for malignancy.  The patient has had an unremarkable postoperative course."  Her subsequent  history is as detailed below.  INTERVAL HISTORY: [name] returns today for follow up of her breast cancer accompanied by her husband [name]. The patient is tolerating the anastrozole with no side effects that she is aware of, and particularly no significant concerns regarding hot flashes, which are mild, vaginal dryness, or arthralgias/myalgias, which are present but not more intense or persistent than before she started anastrozole.Marland Kitchen   REVIEW OF SYSTEMS: Her biggest problem remains quadriceps weakness, and she is in a wheelchair all the time. She doesn't use her walker. It is hard for her to get around, as a result, and so she had to postpone her mammogram. She tells me it is rescheduled for later this month. She thinks she needs to get her eyes checked. There have been no unusual headaches, other visual changes, dizziness, gait imbalance, cough, phlegm production, pleurisy, shortness of breath, chest pain and pressure, or change in bowel or bladder habits. She isn't moderately constipated due to her narcotics but she deals with that by using Dulcolax. A detailed review of systems today was otherwise noncontributory  PAST MEDICAL HISTORY: Past Medical History  Diagnosis Date  . Hypertension   . Difficult intubation     Fastrach #4 LMA then # 7 ETT used in 2006 [facility], cervical laminectomy)  . Hypothyroidism   . Cervical neuropathy     PERIPHERAL NEUROPATHY , HAD CERVICAL FUSION  . PONV (postoperative nausea and vomiting)   . Hypercholesteremia   . Kidney stones 1990's    "twice;  passed in hospital on my own" (04/13/2012)  . Arthritis     "all over" (04/13/2012)  . Breast cancer 2011    "right" (04/13/2012)    PAST SURGICAL HISTORY: Past Surgical History  Procedure Laterality Date  . Caldwell luc  ~ 2003    "benign tumor removed from up under gum" (04/13/2012)  . Posterior cervical laminectomy  2006  . Eye surgery  2004    STENTS TO BIL EYES  . Tonsillectomy and adenoidectomy  ~ 1954   . Abdominal hysterectomy  ~ 1976  . Breast lumpectomy  2011    "right" (04/13/2012)  . Dacrocystorhinostomy  ~ 2000    "put stents in my tear ducts; both eyes" (04/13/2012)  . Total knee arthroplasty with revision components  04/13/2012    Procedure: TOTAL KNEE ARTHROPLASTY WITH REVISION COMPONENTS;  Surgeon: Hessie Dibble, MD;  Location: [facility];  Service: Orthopedics;  Laterality: Left;    FAMILY HISTORY Family History  Problem Relation Age of Onset  . Heart disease Father     GYNECOLOGIC HISTORY:   She is G 2, P 2, menarche age 63, had menopause at the time of hysterectomy (ovaries retained).  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  She previously worked at the [employer], discontinued working over 10 years ago and has taught at [employer].  She became disabled after her next surgery in 2006. The patient's husband, [name], used to own a [employer] and later owned a Armed forces operational officer. He is now retired. Is just the 2 of them at home. She has two adult children, [name] who lives in the [place] and [name] lives in [place]. Both work with disabled children..  She is on disability.   ADVANCED DIRECTIVES: Not on file.   HEALTH MAINTENANCE: History  Substance Use Topics  . Smoking status: Light Tobacco Smoker -- 0.25 packs/day for 48 years    Types: Cigarettes  . Smokeless tobacco: Never Used  . Alcohol Use: No     Colonoscopy: The patient has never had a colonoscopy.   PAP:  The patient states her last Pap smear was 2 years ago.  Bone density: March 2014/ [facility]/ normal  Lipid panel:  Not on file.  Allergies  Allergen Reactions  . Ivp Dye [Iodinated Diagnostic Agents] Other (See Comments)    "hot and sweaty and almost passed out"  . Aspirin Hives  . Sulfa Antibiotics Hives    Current Outpatient Prescriptions  Medication Sig Dispense Refill  . anastrozole (ARIMIDEX) 1 MG tablet Take 1 tablet (1 mg total) by mouth  daily.  30 tablet  12  . Calcium Carbonate-Vitamin D (CALCIUM 600+D) 600-200 MG-UNIT TABS Take 3 tablets by mouth at bedtime. 1 tab am and 2 tabs QHS      . Chlorhexidine Gluconate Cloth 2 % PADS Apply 6 each topically daily at 6 (six) AM.  24 each  0  . DULoxetine (CYMBALTA) 60 MG capsule Take 60 mg by mouth daily.        . folic acid (FOLVITE) 1 MG tablet Take 1 mg by mouth daily.        . furosemide (LASIX) 40 MG tablet Take 40 mg by mouth daily.        Marland Kitchen gabapentin (NEURONTIN) 300 MG capsule Take 2 capsules (600 mg total) by mouth 4 (four) times daily.  80 capsule  1  . iron polysaccharides (NU-IRON) 150 MG capsule Take 150  mg by mouth 2 (two) times daily.      Marland Kitchen levothyroxine (SYNTHROID, LEVOTHROID) 125 MCG tablet Take 125 mcg by mouth daily.        . magnesium oxide (MAG-OX) 400 MG tablet Take 400 mg by mouth 2 (two) times daily.        . methocarbamol (ROBAXIN) 500 MG tablet Take 1 tablet (500 mg total) by mouth every 6 (six) hours as needed.  60 tablet  0  . metoprolol (TOPROL-XL) 100 MG 24 hr tablet Take 100 mg by mouth daily.        Marland Kitchen NIFEdipine (PROCARDIA XL/ADALAT-CC) 30 MG 24 hr tablet Take 30 mg by mouth daily.        Marland Kitchen oxyCODONE-acetaminophen (PERCOCET) 10-650 MG per tablet Take 1 tablet by mouth every 6 (six) hours as needed. For pain  60 tablet  0  . zolpidem (AMBIEN) 10 MG tablet Take 10 mg by mouth at bedtime as needed. For sleep       No current facility-administered medications for this visit.    OBJECTIVE: Middle-aged [place] American woman examined in a wheelchair There were no vitals filed for this visit.   There is no weight on file to calculate BMI.      ECOG FS:   2- Symptomatic but capable limited self-care  Sclerae unicteric Oropharynx clear No cervical or supraclavicular adenopathy Lungs no rales or rhonchi Heart regular rate and rhythm Abd benign MSK no focal spinal tenderness, no peripheral edema Neuro: nonfocal, well oriented, positive affect Breasts:  The right breast is status post lumpectomy and radiation. Scar tissue can be felt inferiorly around the 12:00 area of the breast. This is not tender and not well-defined. There are no skin changes or nipple changes of concern and no other masslike areas. The right axilla is benign. The left breast is unremarkable.    LAB RESULTS: Lab Results  Component Value Date   WBC 4.7 12/22/2012   NEUTROABS 2.6 12/22/2012   HGB 13.7 12/22/2012   HCT 40.0 12/22/2012   MCV 98.7 12/22/2012   PLT 191 12/22/2012      Chemistry      Component Value Date/Time   NA 141 12/22/2012 1215   NA 140 04/16/2012 0630   K 3.7 12/22/2012 1215   K 3.4* 04/16/2012 0630   CL 103 04/16/2012 0630   CL 103 04/02/2012 1150   CO2 26 12/22/2012 1215   CO2 27 04/16/2012 0630   BUN 14.5 12/22/2012 1215   BUN 18 04/16/2012 0630   CREATININE 0.9 12/22/2012 1215   CREATININE 0.97 04/16/2012 0630      Component Value Date/Time   CALCIUM 9.8 12/22/2012 1215   CALCIUM 8.7 04/16/2012 0630   ALKPHOS 89 12/22/2012 1215   ALKPHOS 68 09/29/2011 1322   AST 30 12/22/2012 1215   AST 18 09/29/2011 1322   ALT 27 12/22/2012 1215   ALT 17 09/29/2011 1322   BILITOT 0.38 12/22/2012 1215   BILITOT 0.4 09/29/2011 1322      Lab Results  Component Value Date   LABCA2 18 02/17/2011   Urinalysis    Component Value Date/Time   COLORURINE AMBER* 04/15/2012 2324   APPEARANCEUR TURBID* 04/15/2012 2324   LABSPEC 1.024 04/15/2012 2324   PHURINE 6.0 04/15/2012 2324   GLUCOSEU NEGATIVE 04/15/2012 2324   HGBUR MODERATE* 04/15/2012 2324   BILIRUBINUR NEGATIVE 04/15/2012 2324   KETONESUR NEGATIVE 04/15/2012 2324   PROTEINUR 100* 04/15/2012 2324   UROBILINOGEN 1.0 04/15/2012 2324  NITRITE NEGATIVE 04/15/2012 2324   LEUKOCYTESUR NEGATIVE 04/15/2012 2324    STUDIES: DEXA scan March 2014 was normal. [facility] 04/27/2013 was unremarkable   ASSESSMENT: 63 y.o.  [place] woman:  1. Status post right breast lower inner quadrant lumpectomy with sentinel biopsy on  01/22/2010 for a pT1c pN0,stage IA  invasive ductal carcinoma, grade 1, ER 86%, PR 41%, Ki-67 7%, HER-2 new no amplification with a 1.17 ratio.  2. Status post radiation therapy from 03/18/2010 through 05/07/2010.  3. Oncotype DX score of 15% predicts an average rate of distant recurrence of 10% within 10 years if the patient's only systemic treatment is tamoxifen for 5 years.  4. on Arimidex since 05/2010.   PLAN: She is doing terrific from a breast cancer point of view, tolerating the anastrozole well, with a good DEXA scan this year. Mammogram is pending later this month. I am going to start seeing her on a once a year basis, late September of next year, so she can have her mammogram before that visit. The plan is to continue anastrozole for at least 5 years. She has a good understanding of the overall plan an outer call for any problems that may develop before her next visit.  Chauncey Cruel, MD   12/22/2013, 10:48 AM  This encounter was created in error - please disregard.

## 2013-12-23 ENCOUNTER — Telehealth: Payer: Self-pay | Admitting: Oncology

## 2013-12-23 NOTE — Telephone Encounter (Signed)
Sent letter to patient from Dr. Magrinat. °

## 2013-12-30 ENCOUNTER — Other Ambulatory Visit: Payer: Self-pay | Admitting: Physical Medicine and Rehabilitation

## 2013-12-30 DIAGNOSIS — M5442 Lumbago with sciatica, left side: Secondary | ICD-10-CM

## 2013-12-30 DIAGNOSIS — M542 Cervicalgia: Secondary | ICD-10-CM

## 2013-12-30 DIAGNOSIS — M5441 Lumbago with sciatica, right side: Secondary | ICD-10-CM

## 2013-12-30 DIAGNOSIS — M546 Pain in thoracic spine: Secondary | ICD-10-CM

## 2014-01-02 ENCOUNTER — Telehealth: Payer: Self-pay | Admitting: Nurse Practitioner

## 2014-01-02 ENCOUNTER — Other Ambulatory Visit: Payer: Self-pay | Admitting: *Deleted

## 2014-01-02 DIAGNOSIS — C50919 Malignant neoplasm of unspecified site of unspecified female breast: Secondary | ICD-10-CM

## 2014-01-02 MED ORDER — ANASTROZOLE 1 MG PO TABS: 1.0000 mg | ORAL_TABLET | Freq: Every day | ORAL | Status: DC

## 2014-01-02 NOTE — Telephone Encounter (Signed)
per pt cld to r/s missed appt-sch & gave pt new time & date

## 2014-01-05 ENCOUNTER — Other Ambulatory Visit: Payer: Medicare HMO

## 2014-01-09 ENCOUNTER — Other Ambulatory Visit: Payer: Self-pay | Admitting: Emergency Medicine

## 2014-01-11 ENCOUNTER — Telehealth: Payer: Self-pay | Admitting: Nurse Practitioner

## 2014-01-11 ENCOUNTER — Encounter: Payer: Self-pay | Admitting: Nurse Practitioner

## 2014-01-11 ENCOUNTER — Other Ambulatory Visit (HOSPITAL_BASED_OUTPATIENT_CLINIC_OR_DEPARTMENT_OTHER): Payer: Medicare HMO

## 2014-01-11 ENCOUNTER — Ambulatory Visit (HOSPITAL_BASED_OUTPATIENT_CLINIC_OR_DEPARTMENT_OTHER): Payer: Medicare HMO | Admitting: Nurse Practitioner

## 2014-01-11 VITALS — BP 120/54 | HR 75 | Temp 97.7°F | Resp 18

## 2014-01-11 DIAGNOSIS — E559 Vitamin D deficiency, unspecified: Secondary | ICD-10-CM

## 2014-01-11 DIAGNOSIS — M8580 Other specified disorders of bone density and structure, unspecified site: Secondary | ICD-10-CM

## 2014-01-11 DIAGNOSIS — Z853 Personal history of malignant neoplasm of breast: Secondary | ICD-10-CM

## 2014-01-11 DIAGNOSIS — C50311 Malignant neoplasm of lower-inner quadrant of right female breast: Secondary | ICD-10-CM

## 2014-01-11 DIAGNOSIS — Z23 Encounter for immunization: Secondary | ICD-10-CM

## 2014-01-11 LAB — COMPREHENSIVE METABOLIC PANEL (CC13)
ALK PHOS: 93 U/L (ref 40–150)
ALT: 20 U/L (ref 0–55)
ANION GAP: 8 meq/L (ref 3–11)
AST: 23 U/L (ref 5–34)
Albumin: 3.4 g/dL — ABNORMAL LOW (ref 3.5–5.0)
BILIRUBIN TOTAL: 0.37 mg/dL (ref 0.20–1.20)
BUN: 12.7 mg/dL (ref 7.0–26.0)
CO2: 29 mEq/L (ref 22–29)
CREATININE: 0.9 mg/dL (ref 0.6–1.1)
Calcium: 9.7 mg/dL (ref 8.4–10.4)
Chloride: 105 mEq/L (ref 98–109)
Glucose: 132 mg/dl (ref 70–140)
Potassium: 3.8 mEq/L (ref 3.5–5.1)
Sodium: 142 mEq/L (ref 136–145)
TOTAL PROTEIN: 7.5 g/dL (ref 6.4–8.3)

## 2014-01-11 LAB — CBC WITH DIFFERENTIAL/PLATELET
BASO%: 0.3 % (ref 0.0–2.0)
Basophils Absolute: 0 10*3/uL (ref 0.0–0.1)
EOS%: 1.2 % (ref 0.0–7.0)
Eosinophils Absolute: 0 10*3/uL (ref 0.0–0.5)
HEMATOCRIT: 37.8 % (ref 34.8–46.6)
HGB: 13 g/dL (ref 11.6–15.9)
LYMPH%: 33.5 % (ref 14.0–49.7)
MCH: 33.2 pg (ref 25.1–34.0)
MCHC: 34.4 g/dL (ref 31.5–36.0)
MCV: 96.7 fL (ref 79.5–101.0)
MONO#: 0.3 10*3/uL (ref 0.1–0.9)
MONO%: 9.9 % (ref 0.0–14.0)
NEUT#: 1.8 10*3/uL (ref 1.5–6.5)
NEUT%: 55.1 % (ref 38.4–76.8)
PLATELETS: 164 10*3/uL (ref 145–400)
RBC: 3.91 10*6/uL (ref 3.70–5.45)
RDW: 14.7 % — ABNORMAL HIGH (ref 11.2–14.5)
WBC: 3.3 10*3/uL — ABNORMAL LOW (ref 3.9–10.3)
lymph#: 1.1 10*3/uL (ref 0.9–3.3)

## 2014-01-11 MED ORDER — INFLUENZA VAC SPLIT QUAD 0.5 ML IM SUSY
0.5000 mL | PREFILLED_SYRINGE | Freq: Once | INTRAMUSCULAR | Status: AC
  Administered 2014-01-11: 0.5 mL via INTRAMUSCULAR
  Filled 2014-01-11: qty 0.5

## 2014-01-11 NOTE — Telephone Encounter (Signed)
, °

## 2014-01-11 NOTE — Progress Notes (Signed)
Patient [name], female   DOB: 1950/11/11, 63 y.o.   MRN: [id number] [name]  [phone]) [phone]   [phone]  OFFICE PROGRESS NOTE  [name]   DOB: [birth year]  MR#: [id number]  PET#:[id number]   PCP: [name] Mariscal, MD SU: [name] Skates, MD RAD ONC:  [name] Gross, MD OTHER MD:  [name]   HISTORY OF PRESENT ILLNESS: From Dr. Julien [name] new patient evaluation dated 02/06/2010:    "This is a delightful 63 year old woman from [facility] referred by Dr. Dalbert [name] for evaluation and treatment of breast cancer.  She is here today with her family.  This woman has been in good health.  She has undergone annual screening mammography.  She has also been in reasonably good health.  She had a mammogram performed on 11/25/2009 as well as 12/03/2009.  These showed potential abnormality in the right breast.  Focal density was noted medially.  No other abnormalities were seen.  Right breast ultrasound showed an irregular hypoechoic mass measuring 7 x 7.5 x 10 located at 4 o'clock 3 cm from the nipple.  A biopsy was recommended.  Biopsy performed on 12/19/2009 showed invasive ductal cancer associated with DCIS.  This is ER positive 86%, PR positive 41%, proliferative index is 7%, HER-2 is not amplified with the ratio of 1.17.  A preoperative MRI scan was performed on 12/25/2009; this showed a small enhancing mass in the lower inner quadrant of right breast.  This measured 9 x 7 x 11 mm.  Bilateral axillary lymph nodes were visualized, were not pathologically enlarged.  The patient underwent a lumpectomy and sentinel lymph node evaluation on 01/22/2010.  This showed an invasive ductal cancer, grade 1 of 3, measuring 1.1 cm.  Surgical margins were negative.  DCIS was seen focally 0.1 cm from lateral margin.  Two sentinel lymph nodes were identified, both of which were negative for malignancy.  The patient has had an unremarkable postoperative course."  Her subsequent  history is as detailed below.  INTERVAL HISTORY: [name] returns today for follow up of her breast cancer, accompanied by her husband, [name]. She has been on anastrozole since February 2012 and is tolerating it well with few side effects. She denies hot flashes, vaginal dryness, or intense arthritic pain. She already has bilateral knee pain, but the anastrozole makes this no worse.   REVIEW OF SYSTEMS: [name] denies fevers, chills, nausea, or vomiting. She is chronically constipated because of narcotic use, but miralax manages this well. She is immobile, and is in a wheelchair all of the time. She has some nerve pain and takes gabapentin for this. She denies shortness of breath, chest pain, cough, fatigue, or palpitations. A detailed review of systems is otherwise noncontributory.   PAST MEDICAL HISTORY: Past Medical History  Diagnosis Date  . Hypertension   . Difficult intubation     Fastrach #4 LMA then # 7 ETT used in 2006 [facility], cervical laminectomy)  . Hypothyroidism   . Cervical neuropathy     PERIPHERAL NEUROPATHY , HAD CERVICAL FUSION  . PONV (postoperative nausea and vomiting)   . Hypercholesteremia   . Kidney stones 1990's    "twice; passed in hospital on my own" (04/13/2012)  . Arthritis     "all over" (04/13/2012)  . Breast cancer 2011    "right" (04/13/2012)    PAST SURGICAL HISTORY: Past Surgical History  Procedure Laterality Date  . Caldwell luc  ~ 2003    "benign tumor removed from up under  gum" (04/13/2012)  . Posterior cervical laminectomy  2006  . Eye surgery  2004    STENTS TO BIL EYES  . Tonsillectomy and adenoidectomy  ~ 1954  . Abdominal hysterectomy  ~ 1976  . Breast lumpectomy  2011    "right" (04/13/2012)  . Dacrocystorhinostomy  ~ 2000    "put stents in my tear ducts; both eyes" (04/13/2012)  . Total knee arthroplasty with revision components  04/13/2012    Procedure: TOTAL KNEE ARTHROPLASTY WITH REVISION COMPONENTS;  Surgeon: [name] Dibble, MD;   Location: [facility];  Service: Orthopedics;  Laterality: Left;    FAMILY HISTORY Family History  Problem Relation Age of Onset  . Heart disease Father     GYNECOLOGIC HISTORY:   She is G 2, P 2, menarche age 63, had menopause at the time of hysterectomy (ovaries retained).  SOCIAL HISTORY: The patient worked as Secretary/administrator for many years, both in [place] as well as [employer].  She previously worked at the [employer], discontinued working over 10 years ago and has taught at [employer].  She became disabled after her next surgery in 2006. The patient's husband, [name], used to own a [employer] and later owned a Armed forces operational officer. He is now retired. Is just the 2 of them at home. She has two adult children, [name] who lives in the [place] and [name] lives in [place]. Both work with disabled children..  She is on disability.   ADVANCED DIRECTIVES: Not on file.   HEALTH MAINTENANCE: History  Substance Use Topics  . Smoking status: Light Tobacco Smoker -- 0.25 packs/day for 48 years    Types: Cigarettes  . Smokeless tobacco: Never Used  . Alcohol Use: No     Colonoscopy: The patient has never had a colonoscopy.   PAP:  The patient states her last Pap smear was 2 years ago.  Bone density: March 2014/ [facility]/ normal  Lipid panel:  Not on file.  Allergies  Allergen Reactions  . Ivp Dye [Iodinated Diagnostic Agents] Other (See Comments)    "hot and sweaty and almost passed out"  . Aspirin Hives  . Sulfa Antibiotics Hives    Current Outpatient Prescriptions  Medication Sig Dispense Refill  . anastrozole (ARIMIDEX) 1 MG tablet Take 1 tablet (1 mg total) by mouth daily.  30 tablet  12  . Calcium Carbonate-Vitamin D (CALCIUM 600+D) 600-200 MG-UNIT TABS Take 3 tablets by mouth at bedtime. 1 tab am and 2 tabs QHS      . DULoxetine (CYMBALTA) 60 MG capsule Take 60 mg by mouth daily.        . folic acid (FOLVITE) 1 MG tablet Take 1 mg by mouth daily.        . furosemide (LASIX) 40 MG tablet Take  40 mg by mouth daily.        . Gabapentin, PHN, (GRALISE PO) Take 2 tablets by mouth at bedtime.      Marland Kitchen levothyroxine (SYNTHROID, LEVOTHROID) 125 MCG tablet Take 125 mcg by mouth daily.        . magnesium oxide (MAG-OX) 400 MG tablet Take 400 mg by mouth 2 (two) times daily.        . methocarbamol (ROBAXIN) 500 MG tablet Take 1 tablet (500 mg total) by mouth every 6 (six) hours as needed.  60 tablet  0  . metoprolol (TOPROL-XL) 100 MG 24 hr tablet Take 100 mg by mouth daily.        Marland Kitchen NIFEdipine (PROCARDIA XL/ADALAT-CC)  30 MG 24 hr tablet Take 30 mg by mouth daily.        Marland Kitchen oxyCODONE-acetaminophen (PERCOCET) 10-650 MG per tablet Take 1 tablet by mouth every 6 (six) hours as needed. For pain  60 tablet  0  . zolpidem (AMBIEN) 10 MG tablet Take 10 mg by mouth at bedtime as needed. For sleep       No current facility-administered medications for this visit.    OBJECTIVE: Middle-aged [place] American woman examined in a wheelchair Filed Vitals:   01/11/14 1354  BP: 120/54  Pulse: 75  Temp: 97.7 F (36.5 C)  Resp: 18     Body mass index is 0.00 kg/(m^2).      ECOG FS:   2- Symptomatic but capable limited self-care  Skin: warm, dry  HEENT: sclerae anicteric, conjunctivae pink, oropharynx clear. No thrush or mucositis.  Lymph Nodes: No cervical or supraclavicular lymphadenopathy  Lungs: clear to auscultation bilaterally, no rales, wheezes, or rhonci  Heart: regular rate and rhythm  Abdomen: round, soft, non tender, positive bowel sounds  Musculoskeletal: No focal spinal tenderness, no peripheral edema  Neuro: non focal, well oriented, positive affect  Breasts: right breast status post lumpectomy and radiation. No evidence of recurrent disease. Right axilla benign. Left breast unremarkable.   LAB RESULTS: Lab Results  Component Value Date   WBC 3.3* 01/11/2014   NEUTROABS 1.8 01/11/2014   HGB 13.0 01/11/2014   HCT 37.8 01/11/2014   MCV 96.7 01/11/2014   PLT 164 01/11/2014       Chemistry      Component Value Date/Time   NA 142 01/11/2014 1256   NA 140 04/16/2012 0630   K 3.8 01/11/2014 1256   K 3.4* 04/16/2012 0630   CL 103 04/16/2012 0630   CL 103 04/02/2012 1150   CO2 29 01/11/2014 1256   CO2 27 04/16/2012 0630   BUN 12.7 01/11/2014 1256   BUN 18 04/16/2012 0630   CREATININE 0.9 01/11/2014 1256   CREATININE 0.97 04/16/2012 0630      Component Value Date/Time   CALCIUM 9.7 01/11/2014 1256   CALCIUM 8.7 04/16/2012 0630   ALKPHOS 93 01/11/2014 1256   ALKPHOS 68 09/29/2011 1322   AST 23 01/11/2014 1256   AST 18 09/29/2011 1322   ALT 20 01/11/2014 1256   ALT 17 09/29/2011 1322   BILITOT 0.37 01/11/2014 1256   BILITOT 0.4 09/29/2011 1322      Lab Results  Component Value Date   LABCA2 18 02/17/2011   Urinalysis    Component Value Date/Time   COLORURINE AMBER* 04/15/2012 2324   APPEARANCEUR TURBID* 04/15/2012 2324   LABSPEC 1.024 04/15/2012 2324   PHURINE 6.0 04/15/2012 2324   GLUCOSEU NEGATIVE 04/15/2012 2324   HGBUR MODERATE* 04/15/2012 2324   BILIRUBINUR NEGATIVE 04/15/2012 2324   KETONESUR NEGATIVE 04/15/2012 2324   PROTEINUR 100* 04/15/2012 2324   UROBILINOGEN 1.0 04/15/2012 2324   NITRITE NEGATIVE 04/15/2012 2324   LEUKOCYTESUR NEGATIVE 04/15/2012 2324    STUDIES: Most recent mammogram on 04/26/13 was unremarkable.   Most recent bone density scan on 07/01/12 showed a t-score of 0.2 (normal).  ASSESSMENT: 63 y.o.  [place] woman:  1. Status post right breast lower inner quadrant lumpectomy with sentinel biopsy on 01/22/2010 for a pT1c pN0,stage IA  invasive ductal carcinoma, grade 1, ER 86%, PR 41%, Ki-67 7%, HER-2 new no amplification with a 1.17 ratio.  2. Status post radiation therapy from 03/18/2010 through 05/07/2010.  3. Oncotype DX score of  15% predicts an average rate of distant recurrence of 10% within 10 years if the patient's only systemic treatment is tamoxifen for 5 years.  4. on Anastrozole since 05/2010.   PLAN: Zoi is doing well as far as her  breast cancer is concerned. The labs were reviewed in detail and were stable. She is now 4 years out from her definitive surgery with no evidence of recurrent disease. She is tolerating the anastrozole well and will continue until February 2017, to complete 5 years of anti-estrogen therapy.   Bonnetta is due for her next mammogram in January, and her next bone density scan in March. She will return to clinic next October for labs and a follow up visit. She understands and agrees with this plan. She has been encouraged to call with any issues that might arise before her next visit here.   Marcelino Duster, NP   01/11/2014, 3:54 PM

## 2014-01-17 LAB — VITAMIN D 25 HYDROXY (VIT D DEFICIENCY, FRACTURES): VIT D 25 HYDROXY: 43 ng/mL (ref 30–89)

## 2014-03-20 ENCOUNTER — Telehealth: Payer: Self-pay | Admitting: Oncology

## 2014-03-20 NOTE — Telephone Encounter (Signed)
lvm for pt regarding to OCT 6 appt moved to 10.13 per md on pal....mailed pt appt sched/and letter

## 2014-08-23 DIAGNOSIS — M4804 Spinal stenosis, thoracic region: Secondary | ICD-10-CM | POA: Insufficient documentation

## 2014-10-06 DIAGNOSIS — L89159 Pressure ulcer of sacral region, unspecified stage: Secondary | ICD-10-CM

## 2014-10-06 HISTORY — DX: Pressure ulcer of sacral region, unspecified stage: L89.159

## 2014-10-31 ENCOUNTER — Inpatient Hospital Stay (HOSPITAL_COMMUNITY)
Admission: EM | Admit: 2014-10-31 | Discharge: 2014-11-08 | DRG: 571 | Disposition: A | Discharge status: Home Health | Payer: Medicare Other | Attending: Internal Medicine | Admitting: Internal Medicine

## 2014-10-31 ENCOUNTER — Encounter (HOSPITAL_COMMUNITY): Payer: Self-pay | Admitting: Emergency Medicine

## 2014-10-31 DIAGNOSIS — D7589 Other specified diseases of blood and blood-forming organs: Secondary | ICD-10-CM | POA: Diagnosis present

## 2014-10-31 DIAGNOSIS — E039 Hypothyroidism, unspecified: Secondary | ICD-10-CM | POA: Diagnosis present

## 2014-10-31 DIAGNOSIS — M86652 Other chronic osteomyelitis, left thigh: Secondary | ICD-10-CM | POA: Diagnosis present

## 2014-10-31 DIAGNOSIS — L89510 Pressure ulcer of right ankle, unstageable: Secondary | ICD-10-CM | POA: Diagnosis not present

## 2014-10-31 DIAGNOSIS — Z87442 Personal history of urinary calculi: Secondary | ICD-10-CM

## 2014-10-31 DIAGNOSIS — F329 Major depressive disorder, single episode, unspecified: Secondary | ICD-10-CM | POA: Diagnosis present

## 2014-10-31 DIAGNOSIS — L89890 Pressure ulcer of other site, unstageable: Secondary | ICD-10-CM | POA: Diagnosis present

## 2014-10-31 DIAGNOSIS — L89220 Pressure ulcer of left hip, unstageable: Secondary | ICD-10-CM | POA: Diagnosis present

## 2014-10-31 DIAGNOSIS — L89610 Pressure ulcer of right heel, unstageable: Secondary | ICD-10-CM | POA: Diagnosis present

## 2014-10-31 DIAGNOSIS — D539 Nutritional anemia, unspecified: Secondary | ICD-10-CM | POA: Diagnosis present

## 2014-10-31 DIAGNOSIS — M1990 Unspecified osteoarthritis, unspecified site: Secondary | ICD-10-CM | POA: Diagnosis present

## 2014-10-31 DIAGNOSIS — M79606 Pain in leg, unspecified: Secondary | ICD-10-CM | POA: Diagnosis not present

## 2014-10-31 DIAGNOSIS — L89319 Pressure ulcer of right buttock, unspecified stage: Secondary | ICD-10-CM | POA: Diagnosis present

## 2014-10-31 DIAGNOSIS — L89152 Pressure ulcer of sacral region, stage 2: Secondary | ICD-10-CM | POA: Diagnosis present

## 2014-10-31 DIAGNOSIS — L89620 Pressure ulcer of left heel, unstageable: Secondary | ICD-10-CM | POA: Diagnosis present

## 2014-10-31 DIAGNOSIS — L98429 Non-pressure chronic ulcer of back with unspecified severity: Secondary | ICD-10-CM | POA: Diagnosis present

## 2014-10-31 DIAGNOSIS — Z981 Arthrodesis status: Secondary | ICD-10-CM

## 2014-10-31 DIAGNOSIS — L89219 Pressure ulcer of right hip, unspecified stage: Secondary | ICD-10-CM | POA: Diagnosis present

## 2014-10-31 DIAGNOSIS — Z886 Allergy status to analgesic agent status: Secondary | ICD-10-CM

## 2014-10-31 DIAGNOSIS — G8929 Other chronic pain: Secondary | ICD-10-CM | POA: Diagnosis present

## 2014-10-31 DIAGNOSIS — Z9689 Presence of other specified functional implants: Secondary | ICD-10-CM | POA: Diagnosis present

## 2014-10-31 DIAGNOSIS — Z882 Allergy status to sulfonamides status: Secondary | ICD-10-CM

## 2014-10-31 DIAGNOSIS — K219 Gastro-esophageal reflux disease without esophagitis: Secondary | ICD-10-CM | POA: Diagnosis present

## 2014-10-31 DIAGNOSIS — Z79891 Long term (current) use of opiate analgesic: Secondary | ICD-10-CM

## 2014-10-31 DIAGNOSIS — L89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, stage 4: Secondary | ICD-10-CM | POA: Diagnosis present

## 2014-10-31 DIAGNOSIS — Z170 Estrogen receptor positive status [ER+]: Secondary | ICD-10-CM

## 2014-10-31 DIAGNOSIS — E780 Pure hypercholesterolemia: Secondary | ICD-10-CM | POA: Diagnosis present

## 2014-10-31 DIAGNOSIS — L89159 Pressure ulcer of sacral region, unspecified stage: Secondary | ICD-10-CM | POA: Diagnosis present

## 2014-10-31 DIAGNOSIS — R109 Unspecified abdominal pain: Secondary | ICD-10-CM | POA: Diagnosis present

## 2014-10-31 DIAGNOSIS — F17210 Nicotine dependence, cigarettes, uncomplicated: Secondary | ICD-10-CM | POA: Diagnosis present

## 2014-10-31 DIAGNOSIS — Z96652 Presence of left artificial knee joint: Secondary | ICD-10-CM | POA: Diagnosis present

## 2014-10-31 DIAGNOSIS — G8220 Paraplegia, unspecified: Secondary | ICD-10-CM | POA: Diagnosis present

## 2014-10-31 DIAGNOSIS — C50311 Malignant neoplasm of lower-inner quadrant of right female breast: Secondary | ICD-10-CM | POA: Diagnosis present

## 2014-10-31 DIAGNOSIS — G629 Polyneuropathy, unspecified: Secondary | ICD-10-CM | POA: Diagnosis present

## 2014-10-31 DIAGNOSIS — R32 Unspecified urinary incontinence: Secondary | ICD-10-CM | POA: Diagnosis present

## 2014-10-31 DIAGNOSIS — Z79811 Long term (current) use of aromatase inhibitors: Secondary | ICD-10-CM

## 2014-10-31 DIAGNOSIS — F419 Anxiety disorder, unspecified: Secondary | ICD-10-CM | POA: Diagnosis present

## 2014-10-31 DIAGNOSIS — E876 Hypokalemia: Secondary | ICD-10-CM | POA: Diagnosis present

## 2014-10-31 DIAGNOSIS — M62838 Other muscle spasm: Secondary | ICD-10-CM | POA: Diagnosis present

## 2014-10-31 DIAGNOSIS — I10 Essential (primary) hypertension: Secondary | ICD-10-CM | POA: Diagnosis present

## 2014-10-31 DIAGNOSIS — D638 Anemia in other chronic diseases classified elsewhere: Secondary | ICD-10-CM | POA: Diagnosis present

## 2014-10-31 DIAGNOSIS — R159 Full incontinence of feces: Secondary | ICD-10-CM | POA: Diagnosis present

## 2014-10-31 DIAGNOSIS — Z91041 Radiographic dye allergy status: Secondary | ICD-10-CM

## 2014-10-31 DIAGNOSIS — N390 Urinary tract infection, site not specified: Secondary | ICD-10-CM | POA: Diagnosis present

## 2014-10-31 DIAGNOSIS — K529 Noninfective gastroenteritis and colitis, unspecified: Secondary | ICD-10-CM | POA: Diagnosis present

## 2014-10-31 DIAGNOSIS — M869 Osteomyelitis, unspecified: Secondary | ICD-10-CM | POA: Diagnosis present

## 2014-10-31 DIAGNOSIS — Z993 Dependence on wheelchair: Secondary | ICD-10-CM

## 2014-10-31 HISTORY — DX: Pressure ulcer of sacral region, unspecified stage: L89.159

## 2014-10-31 LAB — BASIC METABOLIC PANEL
ANION GAP: 14 (ref 5–15)
BUN: 10 mg/dL (ref 6–20)
CHLORIDE: 94 mmol/L — AB (ref 101–111)
CO2: 33 mmol/L — AB (ref 22–32)
Calcium: 8.5 mg/dL — ABNORMAL LOW (ref 8.9–10.3)
Creatinine, Ser: 0.84 mg/dL (ref 0.44–1.00)
GFR calc Af Amer: >60 mL/min (ref >60)
GFR calc non Af Amer: >60 mL/min (ref >60)
GLUCOSE: 101 mg/dL — AB (ref 65–99)
POTASSIUM: 2.9 mmol/L — AB (ref 3.5–5.1)
SODIUM: 141 mmol/L (ref 135–145)

## 2014-10-31 LAB — CBC WITH DIFFERENTIAL/PLATELET
BASOS PCT: 0 % (ref 0–1)
Basophils Absolute: 0 10*3/uL (ref 0.0–0.1)
EOS PCT: 0 % (ref 0–5)
Eosinophils Absolute: 0 10*3/uL (ref 0.0–0.7)
HCT: 27.8 % — ABNORMAL LOW (ref 36.0–46.0)
HEMOGLOBIN: 8.9 g/dL — AB (ref 12.0–15.0)
Lymphocytes Relative: 7 % — ABNORMAL LOW (ref 12–46)
Lymphs Abs: 1.2 10*3/uL (ref 0.7–4.0)
MCH: 30.4 pg (ref 26.0–34.0)
MCHC: 32 g/dL (ref 30.0–36.0)
MCV: 94.9 fL (ref 78.0–100.0)
Monocytes Absolute: 0.8 10*3/uL (ref 0.1–1.0)
Monocytes Relative: 5 % (ref 3–12)
NEUTROS ABS: 13.7 10*3/uL — AB (ref 1.7–7.7)
NEUTROS PCT: 88 % — AB (ref 43–77)
PLATELETS: 434 10*3/uL — AB (ref 150–400)
RBC: 2.93 MIL/uL — ABNORMAL LOW (ref 3.87–5.11)
RDW: 14 % (ref 11.5–15.5)
WBC: 15.7 10*3/uL — ABNORMAL HIGH (ref 4.0–10.5)

## 2014-10-31 LAB — HEPATIC FUNCTION PANEL
ALT: 12 U/L — ABNORMAL LOW (ref 14–54)
AST: 25 U/L (ref 15–41)
Albumin: 2 g/dL — ABNORMAL LOW (ref 3.5–5.0)
Alkaline Phosphatase: 54 U/L (ref 38–126)
BILIRUBIN TOTAL: 0.8 mg/dL (ref 0.3–1.2)
Bilirubin, Direct: 0.2 mg/dL (ref 0.1–0.5)
Indirect Bilirubin: 0.6 mg/dL (ref 0.3–0.9)
TOTAL PROTEIN: 7.1 g/dL (ref 6.5–8.1)

## 2014-10-31 LAB — C-REACTIVE PROTEIN: CRP: 28.3 mg/dL — ABNORMAL HIGH (ref <1.0)

## 2014-10-31 LAB — SEDIMENTATION RATE: SED RATE: 135 mm/h — AB (ref 0–22)

## 2014-10-31 LAB — CLOSTRIDIUM DIFFICILE BY PCR: Toxigenic C. Difficile by PCR: NEGATIVE

## 2014-10-31 LAB — LIPASE, BLOOD: Lipase: 21 U/L — ABNORMAL LOW (ref 22–51)

## 2014-10-31 MED ORDER — DULOXETINE HCL 60 MG PO CPEP
60.0000 mg | ORAL_CAPSULE | Freq: Every day | ORAL | Status: DC
  Administered 2014-11-01 – 2014-11-07 (×7): 60 mg via ORAL
  Filled 2014-10-31 (×7): qty 1

## 2014-10-31 MED ORDER — BACLOFEN 10 MG PO TABS
30.0000 mg | ORAL_TABLET | Freq: Two times a day (BID) | ORAL | Status: DC
Start: 2014-10-31 — End: 2014-11-02
  Administered 2014-10-31 – 2014-11-02 (×4): 30 mg via ORAL
  Filled 2014-10-31 (×8): qty 1

## 2014-10-31 MED ORDER — DIPHENHYDRAMINE HCL 50 MG/ML IJ SOLN
12.5000 mg | Freq: Once | INTRAMUSCULAR | Status: AC
  Administered 2014-10-31: 12.5 mg via INTRAVENOUS
  Filled 2014-10-31: qty 1

## 2014-10-31 MED ORDER — BACLOFEN 20 MG PO TABS
30.0000 mg | ORAL_TABLET | ORAL | Status: AC
  Administered 2014-10-31: 30 mg via ORAL
  Filled 2014-10-31: qty 1

## 2014-10-31 MED ORDER — NIFEDIPINE ER 30 MG PO TB24
30.0000 mg | ORAL_TABLET | Freq: Every day | ORAL | Status: DC
  Administered 2014-10-31 – 2014-11-08 (×7): 30 mg via ORAL
  Filled 2014-10-31 (×10): qty 1

## 2014-10-31 MED ORDER — MORPHINE SULFATE ER 30 MG PO TBCR
60.0000 mg | EXTENDED_RELEASE_TABLET | Freq: Two times a day (BID) | ORAL | Status: DC
  Administered 2014-10-31 – 2014-11-08 (×15): 60 mg via ORAL
  Filled 2014-10-31 (×16): qty 2

## 2014-10-31 MED ORDER — ANASTROZOLE 1 MG PO TABS
1.0000 mg | ORAL_TABLET | Freq: Every day | ORAL | Status: DC
  Administered 2014-10-31 – 2014-11-07 (×8): 1 mg via ORAL
  Filled 2014-10-31 (×12): qty 1

## 2014-10-31 MED ORDER — OXYCODONE HCL 5 MG PO TABS
5.0000 mg | ORAL_TABLET | Freq: Four times a day (QID) | ORAL | Status: DC | PRN
  Administered 2014-11-01 – 2014-11-08 (×5): 5 mg via ORAL
  Filled 2014-10-31 (×6): qty 1

## 2014-10-31 MED ORDER — LEVOTHYROXINE SODIUM 25 MCG PO TABS
125.0000 ug | ORAL_TABLET | Freq: Every day | ORAL | Status: DC
  Administered 2014-11-01 – 2014-11-08 (×8): 125 ug via ORAL
  Filled 2014-10-31 (×16): qty 1

## 2014-10-31 MED ORDER — SIMETHICONE 125 MG PO CAPS: 125.0000 mg | ORAL_CAPSULE | Freq: Every day | ORAL | Status: DC | PRN

## 2014-10-31 MED ORDER — LOPERAMIDE HCL 2 MG PO CAPS
4.0000 mg | ORAL_CAPSULE | Freq: Once | ORAL | Status: AC
  Administered 2014-10-31: 4 mg via ORAL
  Filled 2014-10-31: qty 2

## 2014-10-31 MED ORDER — OXYCODONE-ACETAMINOPHEN 10-325 MG PO TABS: 1.0000 | ORAL_TABLET | Freq: Four times a day (QID) | ORAL | Status: DC | PRN

## 2014-10-31 MED ORDER — ONDANSETRON HCL 4 MG/2ML IJ SOLN: 4.0000 mg | Freq: Three times a day (TID) | INTRAMUSCULAR | Status: AC | PRN

## 2014-10-31 MED ORDER — ACETAMINOPHEN 500 MG PO TABS
500.0000 mg | ORAL_TABLET | Freq: Four times a day (QID) | ORAL | Status: DC | PRN
  Administered 2014-11-03 – 2014-11-06 (×2): 500 mg via ORAL
  Filled 2014-10-31 (×2): qty 1

## 2014-10-31 MED ORDER — OXYCODONE-ACETAMINOPHEN 5-325 MG PO TABS
1.0000 | ORAL_TABLET | Freq: Four times a day (QID) | ORAL | Status: DC | PRN
  Administered 2014-11-01 – 2014-11-08 (×8): 1 via ORAL
  Filled 2014-10-31 (×8): qty 1

## 2014-10-31 MED ORDER — FOLIC ACID 1 MG PO TABS
1.0000 mg | ORAL_TABLET | Freq: Every day | ORAL | Status: DC
  Administered 2014-11-01 – 2014-11-07 (×7): 1 mg via ORAL
  Filled 2014-10-31 (×7): qty 1

## 2014-10-31 MED ORDER — GABAPENTIN 800 MG PO TABS
1600.0000 mg | ORAL_TABLET | Freq: Two times a day (BID) | ORAL | Status: DC
  Filled 2014-10-31: qty 2

## 2014-10-31 MED ORDER — METOPROLOL TARTRATE 100 MG PO TABS
100.0000 mg | ORAL_TABLET | Freq: Every day | ORAL | Status: DC
  Administered 2014-11-01 – 2014-11-07 (×6): 100 mg via ORAL
  Filled 2014-10-31 (×3): qty 1
  Filled 2014-10-31: qty 4
  Filled 2014-10-31 (×4): qty 1

## 2014-10-31 MED ORDER — OXYCODONE-ACETAMINOPHEN 5-325 MG PO TABS
1.0000 | ORAL_TABLET | Freq: Once | ORAL | Status: AC
  Administered 2014-10-31: 1 via ORAL
  Filled 2014-10-31: qty 1

## 2014-10-31 MED ORDER — SIMETHICONE 80 MG PO CHEW
120.0000 mg | CHEWABLE_TABLET | Freq: Every day | ORAL | Status: DC | PRN
  Administered 2014-10-31 – 2014-11-02 (×2): 120 mg via ORAL
  Filled 2014-10-31 (×2): qty 2

## 2014-10-31 MED ORDER — ENOXAPARIN SODIUM 40 MG/0.4ML ~~LOC~~ SOLN
40.0000 mg | SUBCUTANEOUS | Status: DC
  Administered 2014-10-31 – 2014-11-01 (×2): 40 mg via SUBCUTANEOUS
  Filled 2014-10-31 (×2): qty 0.4

## 2014-10-31 MED ORDER — LOPERAMIDE HCL 2 MG PO CAPS
2.0000 mg | ORAL_CAPSULE | ORAL | Status: DC | PRN
  Administered 2014-11-02: 2 mg via ORAL
  Filled 2014-10-31: qty 1

## 2014-10-31 MED ORDER — GABAPENTIN 400 MG PO CAPS
1600.0000 mg | ORAL_CAPSULE | Freq: Two times a day (BID) | ORAL | Status: DC
  Administered 2014-10-31 – 2014-11-08 (×16): 1600 mg via ORAL
  Filled 2014-10-31 (×16): qty 4

## 2014-10-31 NOTE — ED Notes (Signed)
Husband stated, she has a sore on her leg and a real bad sore on her buttocks that's been there about 5 weeks.

## 2014-10-31 NOTE — H&P (Signed)
Date: 11/01/2014               Patient Name:  [name] MRN: [id number]  DOB: 07/03/1950 Age / Sex: 64 y.o., female   PCP: [name], MD         Medical Service: Internal Medicine Teaching Service         Attending Physician: Dr. Bartholomew Crews, MD    First Contact: Dr. Benjamine Mola Pager: [phone]  Second Contact: Dr. Denton Brick Pager: [phone]       After Hours (After 5p/  First Contact Pager: [phone]  weekends / holidays): Second Contact Pager: [phone]   Chief Complaint: Decubitus ulcer  History of Present Illness: [name] is a 64 yo female with HTN, HLD, hypothyroidism, h/o ER/PR+ breast cancer, chronic pain, and paraplegia, presents with abdominal pain and sacral decubitus ulcer.    She was sent to the hospital by her PCP for management of a decubitus ulcer on her right buttock.  According to the patient's daughter, it started at about 1 cm and has grown over the course of a month to 4 cm.  The patient's had previously said that he could fist in it.  A home health nurse has been visiting 3 times/week for wound care and told the patient and family that it was not that bad.  Per patient and daughter, the nurse has been applying a combination of clorox, baking soda, and saline for treatment.  She also has decubitus ulcers to the superior gluteal crease, right lateral heel, and left lateral heel.   The patient and her daughter report a 3 week history of worsening gas and abdominal pain.  She reports she can feel the gas move from her LUQ to RUQ, "under my rib cage," usually forcing the patient to stop what she's doing and catch her breath because the pain is too intense.  She has been taking GasX and Beano for 4 days without relief.  She has also had a 4 days of loose stools.  She is unable to say whether there has been blood or mucus in the stool.  She has not been eating well for the last 4 days.  The patient states this is because she has had a foul taste in her mouth for the last week.   She says it is metallic tasting and does not change with time of day or food.   The patient endorses subjective fever, nausea, and cough.  She denies chest pain, SOB, or dysphagia.  She is incontinent of urine and stool and cannot tell if she is having dysuria.  Meds: Current Facility-Administered Medications  Medication Dose Route Frequency Provider Last Rate Last Dose  . acetaminophen (TYLENOL) tablet 500 mg  500 mg Oral Q6H PRN Jones Bales, MD      . anastrozole (ARIMIDEX) tablet 1 mg  1 mg Oral QHS Jones Bales, MD   1 mg at 10/31/14 2230  . baclofen (LIORESAL) tablet 30 mg  30 mg Oral BID Jones Bales, MD   30 mg at 10/31/14 2230  . DULoxetine (CYMBALTA) DR capsule 60 mg  60 mg Oral QPC supper Jones Bales, MD      . enoxaparin (LOVENOX) injection 40 mg  40 mg Subcutaneous Q24H Jones Bales, MD   40 mg at 10/31/14 2230  . folic acid (FOLVITE) tablet 1 mg  1 mg Oral QPC supper Jones Bales, MD      . gabapentin (NEURONTIN) capsule 1,600  mg  1,600 mg Oral BID Bartholomew Crews, MD   1,600 mg at 10/31/14 2231  . iohexol (OMNIPAQUE) 300 MG/ML solution 50 mL  50 mL Oral Once PRN Medication Radiologist, MD      . levothyroxine (SYNTHROID, LEVOTHROID) tablet 125 mcg  125 mcg Oral QAC breakfast Jones Bales, MD      . loperamide (IMODIUM) capsule 2 mg  2 mg Oral PRN Jones Bales, MD      . metoprolol (LOPRESSOR) tablet 100 mg  100 mg Oral QPC supper Jones Bales, MD      . morphine (MS CONTIN) 12 hr tablet 60 mg  60 mg Oral Q12H Jones Bales, MD   60 mg at 10/31/14 2231  . NIFEdipine (PROCARDIA-XL/ADALAT CC) 24 hr tablet 30 mg  30 mg Oral Daily Jones Bales, MD   30 mg at 10/31/14 2230  . ondansetron (ZOFRAN) injection 4 mg  4 mg Intravenous Q8H PRN Sharlett Iles, MD      . oxyCODONE-acetaminophen (PERCOCET/ROXICET) 5-325 MG per tablet 1 tablet  1 tablet Oral Q6H PRN Bartholomew Crews, MD       And  . oxyCODONE (Oxy IR/ROXICODONE) immediate  release tablet 5 mg  5 mg Oral Q6H PRN Bartholomew Crews, MD      . potassium chloride 10 mEq in 100 mL IVPB  10 mEq Intravenous Q1 Hr x 6 Jones Bales, MD   10 mEq at 11/01/14 0108  . simethicone (MYLICON) chewable tablet 120 mg  120 mg Oral Daily PRN Bartholomew Crews, MD   120 mg at 10/31/14 2102    Allergies: Allergies as of 10/31/2014 - Review Complete 10/31/2014  Allergen Reaction Noted  . Ivp dye [iodinated diagnostic agents] Other (See Comments) 09/12/2010  . Aspirin Hives 09/12/2010  . Sulfa antibiotics Hives 04/01/2012   Past Medical History  Diagnosis Date  . Hypertension   . Difficult intubation     Fastrach #4 LMA then # 7 ETT used in 2006 [facility], cervical laminectomy)  . Hypothyroidism   . Cervical neuropathy     PERIPHERAL NEUROPATHY , HAD CERVICAL FUSION  . PONV (postoperative nausea and vomiting)   . Hypercholesteremia   . Kidney stones 1990's    "twice; passed in hospital on my own" (04/13/2012)  . Arthritis     "all over" (04/13/2012)  . Breast cancer 2011    "right" (04/13/2012)   Past Surgical History  Procedure Laterality Date  . Caldwell luc  ~ 2003    "benign tumor removed from up under gum" (04/13/2012)  . Posterior cervical laminectomy  2006  . Eye surgery  2004    STENTS TO BIL EYES  . Tonsillectomy and adenoidectomy  ~ 1954  . Abdominal hysterectomy  ~ 1976  . Breast lumpectomy  2011    "right" (04/13/2012)  . Dacrocystorhinostomy  ~ 2000    "put stents in my tear ducts; both eyes" (04/13/2012)  . Total knee arthroplasty with revision components  04/13/2012    Procedure: TOTAL KNEE ARTHROPLASTY WITH REVISION COMPONENTS;  Surgeon: Hessie Dibble, MD;  Location: [facility];  Service: Orthopedics;  Laterality: Left;   Family History  Problem Relation Age of Onset  . Heart disease Father    History   Social History  . Marital Status: Married    Spouse Name: N/A  . Number of Children: N/A  . Years of Education: N/A   Occupational History  .  Not on  file.   Social History Main Topics  . Smoking status: Light Tobacco Smoker -- 0.25 packs/day for 48 years    Types: Cigarettes  . Smokeless tobacco: Never Used  . Alcohol Use: No  . Drug Use: No  . Sexual Activity: Not Currently   Other Topics Concern  . Not on file   Social History Narrative    Review of Systems: Pertinent items are noted in HPI.  Physical Exam: Blood pressure 150/66, pulse 102, temperature 99 F (37.2 C), temperature source Oral, resp. rate 18, height 5\' 3"  (1.6 m), weight 170 lb (77.111 kg), SpO2 99 %. Physical Exam  Constitutional: She is oriented to person, place, and time. She appears well-developed and well-nourished.  Lying in bed in moderate distress  HENT:  Head: Normocephalic and atraumatic.  Eyes: EOM are normal.  Neck: Normal range of motion. No tracheal deviation present.  Cardiovascular: Regular rhythm, normal heart sounds and intact distal pulses.   Borderline tachycardic.  Pulmonary/Chest: Effort normal and breath sounds normal. No respiratory distress. She has no wheezes.  Abdominal:  Rigid. TTP to in all quadrants.  Moderately tender in epigastric region.  Positive guarding, but no rebound.  Neurological: She is alert and oriented to person, place, and time.  Skin: Skin is warm and dry.  Superior Gluteal Crease: 1-2 cm stage 2 decubitus ulcer with pink base. No drainage or surrounding erythema.  Left Buttock: Large, >5 cm, stage 3-4 decubitus ulcer, 1-2 cm deep, not fully visualized due to patient position.  Wound is bandaged, packed with gauze.  No drainage appreciated.  Left Lateral Heel: 2-3 mm ulcer draining pus.  Right Lateral Heel: 4 cm ulcer with fibrinous exudate and eschar overlying. Rolled border without surrounding erythema.     Lab results: Basic Metabolic Panel:  Recent Labs  10/31/14 2111  NA 141  K 2.9*  CL 94*  CO2 33*  GLUCOSE 101*  BUN 10  CREATININE 0.84  CALCIUM 8.5*  MG 2.0  PHOS 3.1   Liver  Function Tests:  Recent Labs  10/31/14 2111  AST 25  ALT 12*  ALKPHOS 54  BILITOT 0.8  PROT 7.1  ALBUMIN 2.0*    Recent Labs  10/31/14 2111  LIPASE 21*   No results for input(s): AMMONIA in the last 72 hours. CBC:  Recent Labs  10/31/14 2111  WBC 15.7*  NEUTROABS 13.7*  HGB 8.9*  HCT 27.8*  MCV 94.9  PLT 434*   Cardiac Enzymes: No results for input(s): CKTOTAL, CKMB, CKMBINDEX, TROPONINI in the last 72 hours. BNP: No results for input(s): PROBNP in the last 72 hours. D-Dimer: No results for input(s): DDIMER in the last 72 hours. CBG: No results for input(s): GLUCAP in the last 72 hours. Hemoglobin A1C: No results for input(s): HGBA1C in the last 72 hours. Fasting Lipid Panel: No results for input(s): CHOL, HDL, LDLCALC, TRIG, CHOLHDL, LDLDIRECT in the last 72 hours. Thyroid Function Tests: No results for input(s): TSH, T4TOTAL, FREET4, T3FREE, THYROIDAB in the last 72 hours. Anemia Panel: No results for input(s): VITAMINB12, FOLATE, FERRITIN, TIBC, IRON, RETICCTPCT in the last 72 hours. Coagulation: No results for input(s): LABPROT, INR in the last 72 hours. Urine Drug Screen: Drugs of Abuse  No results found for: LABOPIA, COCAINSCRNUR, LABBENZ, AMPHETMU, THCU, LABBARB  Alcohol Level: No results for input(s): ETH in the last 72 hours. Urinalysis: No results for input(s): COLORURINE, LABSPEC, PHURINE, GLUCOSEU, HGBUR, BILIRUBINUR, KETONESUR, PROTEINUR, UROBILINOGEN, NITRITE, LEUKOCYTESUR in the last 72 hours.  Invalid  input(s): APPERANCEUR   Imaging results:  No results found.  Other results: EKG: sinus tachycardia.  Assessment & Plan by Problem: Principal Problem:   Sacral ulcer Active Problems:   HTN (hypertension)   Breast cancer of lower-inner quadrant of right female breast   Hypothyroidism  [name] is a 64 yo female with HTN, HLD, hypothyroidism, h/o ER/PR+ breast cancer, chronic pain, and paraplegia, presents with abdominal pain and  sacral decubitus ulcer.    Abdominal Pain/Diarrhea: Patient abdomen tense, with severe abdominal pain under the rib cage, elevated WBC, diarrhea, and guarding, concerning for acute abdomen. Colitis also explains patients symptoms.  Symptoms may be 2/2 GERD given cough, metallic taste, and epigastric pain, though GERD does not explain the severity of pain. C diff negative.  Will start antibiotics to also cover for polymicrobial ulcers. - Norovirus - UA - Loperamide - CT abd/pelvis with contrast (premedicate with benadryl) - Simethicone  Decubitus Ulcers: ED notes gluteal ulcer extends to muscle (stage 4). Other ulcers stage 1-2, with one draining pus.  This may explain patient's WBC count. Extent of gluteal ulcer will be evaluated at same time as CT adb/pelvis.  Consider plastics consult and MRI to r/o osteo. - Wound care consult - Recommend Plastics consult - BCx - Vanc/Zosyn per pharmacy  Anemia: Hgb 8.9, down from 13 in 01/2014.  MCV borderline high.  Patient already on Folic acid supplementation. No evidence of bleeding on exam.  No c/o blood in stool. - CBC in AM  HTN: stable. Acute elevations probably 2/2 pain - Nifedipine 30 mg - Metoprolol Succ 100 mg   Hypothyroid: stable - Levothyroxine 125 mcg daily  Paraplegia:  - Baclofen 20 mg BID - Gabapentin 1600 mg BID  Depression: - Cymbalta  H/o Breast Cancer: stable. Last mammogram Jan 2015.  Followed by Susanne Borders NP ([place]). - Anastrozole 1 mg daily  FEN/GI - NPO  DVT Ppx: Lovenox  Dispo: Disposition is deferred at this time, awaiting improvement of current medical problems.   The patient does have a current PCP [name], MD) and does need an [facility] hospital follow-up appointment after discharge.  The patient does not have transportation limitations that hinder transportation to clinic appointments.  Signed: Iline Oven, MD, PhD 11/01/2014, 2:58 AM

## 2014-10-31 NOTE — ED Notes (Signed)
Attempted report 

## 2014-10-31 NOTE — ED Notes (Signed)
Phlebotomy able to get pediatric tubes of blood. Some already clotting. Patient does not want to be stuck anymore. Notified MD.

## 2014-10-31 NOTE — ED Notes (Signed)
Unable to obtain IV access. Notified phlebotomy.

## 2014-10-31 NOTE — ED Notes (Addendum)
Large, deep crater wound in bend of left thigh and buttock.  Small stage two ulcer at top of sacral area.  Large wound to right outer ankle with black, hard tissue in the center. Wound to left heel that is not blanching with a small open area.  Dressed sacral wound with telfa and tegaderm. Packed buttock/thigh wound with saline wet dressing. Covered with telfa, guaze and tegaderms to attempt to keep dry from incontinence.

## 2014-10-31 NOTE — ED Notes (Signed)
Lab unable to use blood sent in pediatric vials. Patient refuses to be stuck again; says she needs a break. Notified Little, MD. She states she is to be admitted and can try again for labs later. Qualifies for admission without lab results due to wounds.

## 2014-10-31 NOTE — ED Notes (Signed)
IV team unable to obtain labs. Phlebotomy trying again now.

## 2014-10-31 NOTE — ED Notes (Signed)
IV team at bedside 

## 2014-10-31 NOTE — ED Provider Notes (Signed)
CSN: [id number]     Arrival date & time 10/31/14  1200 History   First MD Initiated Contact with Patient 10/31/14 1539     Chief Complaint  Patient presents with  . Leg Pain  . Wound Check     (Consider location/radiation/quality/duration/timing/severity/associated sxs/prior Treatment) HPI Comments: 64 year old female with past medical history including paraplegia secondary to cervical fusion, breast cancer, hypothyroidism, hypertension, hyperlipidemia who presents for wound check. Husband states that the patient has had a right upper thigh/gluteal decubitus ulcer for the past 5 months as well as a right ankle ulcer that has been treated by home health nurse at home. 2 days ago, the patient began having nonbloody diarrhea and yesterday began having gas pains in her upper abdomen. The symptoms have continued and her husband has become concerned because he cannot keep the sacral wound clean. The patient states that she may have had a fever this morning but by the time they got to her PCP she was afebrile. The PCP referred her here due to concerns for the ongoing diarrhea in the setting of sacral decubitus ulcer. No chest pain or shortness of breath.  Patient is a 64 y.o. female presenting with leg pain and wound check. The history is provided by the patient and the spouse.  Leg Pain Wound Check    Past Medical History  Diagnosis Date  . Hypertension   . Difficult intubation     Fastrach #4 LMA then # 7 ETT used in 2006 [facility], cervical laminectomy)  . Hypothyroidism   . Cervical neuropathy     PERIPHERAL NEUROPATHY , HAD CERVICAL FUSION  . PONV (postoperative nausea and vomiting)   . Hypercholesteremia   . Kidney stones 1990's    "twice; passed in hospital on my own" (04/13/2012)  . Arthritis     "all over" (04/13/2012)  . Breast cancer 2011    "right" (04/13/2012)   Past Surgical History  Procedure Laterality Date  . [place] luc  ~ 2003    "benign tumor removed from up under gum"  (04/13/2012)  . Posterior cervical laminectomy  2006  . Eye surgery  2004    STENTS TO BIL EYES  . Tonsillectomy and adenoidectomy  ~ 1954  . Abdominal hysterectomy  ~ 1976  . Breast lumpectomy  2011    "right" (04/13/2012)  . Dacrocystorhinostomy  ~ 2000    "put stents in my tear ducts; both eyes" (04/13/2012)  . Total knee arthroplasty with revision components  04/13/2012    Procedure: TOTAL KNEE ARTHROPLASTY WITH REVISION COMPONENTS;  Surgeon: Hessie Dibble, MD;  Location: [place];  Service: Orthopedics;  Laterality: Left;   Family History  Problem Relation Age of Onset  . Heart disease Father    History  Substance Use Topics  . Smoking status: Light Tobacco Smoker -- 0.25 packs/day for 48 years    Types: Cigarettes  . Smokeless tobacco: Never Used  . Alcohol Use: No   OB History    No data available     Review of Systems 10 Systems reviewed and are negative for acute change except as noted in the HPI.   Allergies  Ivp dye; Aspirin; and Sulfa antibiotics  Home Medications   Prior to Admission medications   Medication Sig Start Date End Date Taking? Authorizing Provider  acetaminophen (TYLENOL) 500 MG tablet Take 500 mg by mouth every 6 (six) hours as needed for mild pain or moderate pain.   Yes Historical Provider, MD  Alpha-D-Galactosidase [facility] MELTAWAYS  PO) Take 1 tablet by mouth daily as needed (stomach pain).   Yes Historical Provider, MD  anastrozole (ARIMIDEX) 1 MG tablet Take 1 tablet (1 mg total) by mouth daily. Patient taking differently: Take 1 mg by mouth at bedtime.  01/02/14  Yes Chauncey Cruel, MD  baclofen (LIORESAL) 10 MG tablet Take 30 mg by mouth 2 (two) times daily. 09/26/14  Yes Historical Provider, MD  DULoxetine (CYMBALTA) 60 MG capsule Take 60 mg by mouth daily after supper.    Yes Historical Provider, MD  folic acid (FOLVITE) 1 MG tablet Take 1 mg by mouth daily after supper.    Yes Historical Provider, MD  furosemide (LASIX) 40 MG tablet Take 40  mg by mouth daily after supper.    Yes Historical Provider, MD  gabapentin (NEURONTIN) 800 MG tablet Take 1,600 mg by mouth 2 (two) times daily. 09/22/14  Yes Historical Provider, MD  levothyroxine (SYNTHROID, LEVOTHROID) 125 MCG tablet Take 125 mcg by mouth daily after supper.    Yes Historical Provider, MD  metoprolol (LOPRESSOR) 100 MG tablet Take 100 mg by mouth daily after supper. 10/28/14  Yes Historical Provider, MD  morphine (MS CONTIN) 60 MG 12 hr tablet Take 60 mg by mouth every 12 (twelve) hours. 10/10/14  Yes Historical Provider, MD  NIFEdipine (PROCARDIA XL/ADALAT-CC) 30 MG 24 hr tablet Take 30 mg by mouth daily.     Yes Historical Provider, MD  oxyCODONE-acetaminophen (PERCOCET) 10-325 MG per tablet Take 1 tablet by mouth 6 (six) times daily. 10/10/14  Yes Historical Provider, MD  Simethicone (GAS-X EXTRA STRENGTH) 125 MG CAPS Take 125 mg by mouth daily as needed (stomach pain).   Yes Historical Provider, MD  methocarbamol (ROBAXIN) 500 MG tablet Take 1 tablet (500 mg total) by mouth every 6 (six) hours as needed. Patient not taking: Reported on 10/31/2014 04/15/12   Roselee Nova, PA-C  oxyCODONE-acetaminophen (PERCOCET) 10-650 MG per tablet Take 1 tablet by mouth every 6 (six) hours as needed. For pain Patient not taking: Reported on 10/31/2014 04/15/12   Roselee Nova, PA-C   BP 127/54 mmHg  Pulse 98  Temp(Src) 98.2 F (36.8 C) (Oral)  Resp 16  Ht 5\' 3"  (1.6 m)  Wt 170 lb (77.111 kg)  BMI 30.12 kg/m2  SpO2 97% Physical Exam  Constitutional: She is oriented to person, place, and time. She appears well-developed and well-nourished. No distress.  HENT:  Head: Normocephalic and atraumatic.  Moist mucous membranes  Eyes: Conjunctivae are normal. Pupils are equal, round, and reactive to light.  Neck: Neck supple.  Cardiovascular: Normal rate, regular rhythm and normal heart sounds.   No murmur heard. Pulmonary/Chest: Effort normal and breath sounds normal.  Abdominal: Soft. Bowel  sounds are normal. She exhibits no distension.  Tetanus palpation of the midepigastrium, left upper quadrant, and right upper quadrant with no rebound or guarding, no peritonitis  Musculoskeletal:  Mild bilateral lower extremity edema  Neurological: She is alert and oriented to person, place, and time.  Fluent speech; no sensation of bilateral lower extremities, occasional muscle spasms of legs  Skin: Skin is warm.  Large >10cm unstageable decubitus ulcer at upper right thigh/gluteal crease with contamination by stool, muscle exposure, no surrounding erythema; 4-5cm circular ulcer on lateral R lower leg w/ escar; blister draining on L heel  Psychiatric: She has a normal mood and affect. Judgment normal.  Nursing note and vitals reviewed.   ED Course  Procedures (including critical care time) Labs Review Labs Reviewed  CLOSTRIDIUM DIFFICILE BY PCR (NOT AT [facility])  CBC WITH DIFFERENTIAL/PLATELET  BASIC METABOLIC PANEL  SEDIMENTATION RATE  C-REACTIVE PROTEIN  LIPASE, BLOOD  HEPATIC FUNCTION PANEL    Imaging Review No results found.   EKG Interpretation None      MDM   Final diagnoses:  None    64 year old female who presents with sacral wound that has become repeatedly contaminated with diarrhea over the past 2 days. Patient nontoxic in appearance with reassuring vital signs. Obvious stool contamination of sacral wound and new wound forming on left heel in addition to old wound on the right lower leg. No lower abdominal tenderness or peritonitis. I have ordered labs listed above including C. difficile given frequency of diarrhea. I'm concerned about progression of the patient's already deep sacral ulcer in the setting of ongoing diarrhea. The patient will be admitted for wound care and further work up of her diarrhea.    Sharlett Iles, MD 10/31/14 [phone]

## 2014-11-01 ENCOUNTER — Observation Stay (HOSPITAL_COMMUNITY): Payer: Medicare Other

## 2014-11-01 ENCOUNTER — Encounter (HOSPITAL_COMMUNITY): Payer: Self-pay

## 2014-11-01 DIAGNOSIS — B9689 Other specified bacterial agents as the cause of diseases classified elsewhere: Secondary | ICD-10-CM | POA: Diagnosis not present

## 2014-11-01 DIAGNOSIS — Z993 Dependence on wheelchair: Secondary | ICD-10-CM | POA: Diagnosis not present

## 2014-11-01 DIAGNOSIS — R159 Full incontinence of feces: Secondary | ICD-10-CM | POA: Diagnosis present

## 2014-11-01 DIAGNOSIS — L89510 Pressure ulcer of right ankle, unstageable: Secondary | ICD-10-CM | POA: Diagnosis present

## 2014-11-01 DIAGNOSIS — M869 Osteomyelitis, unspecified: Secondary | ICD-10-CM | POA: Diagnosis present

## 2014-11-01 DIAGNOSIS — L89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, stage 4: Secondary | ICD-10-CM | POA: Diagnosis present

## 2014-11-01 DIAGNOSIS — Z87442 Personal history of urinary calculi: Secondary | ICD-10-CM | POA: Diagnosis not present

## 2014-11-01 DIAGNOSIS — E876 Hypokalemia: Secondary | ICD-10-CM | POA: Diagnosis not present

## 2014-11-01 DIAGNOSIS — G8220 Paraplegia, unspecified: Secondary | ICD-10-CM | POA: Diagnosis present

## 2014-11-01 DIAGNOSIS — D539 Nutritional anemia, unspecified: Secondary | ICD-10-CM | POA: Diagnosis present

## 2014-11-01 DIAGNOSIS — Z981 Arthrodesis status: Secondary | ICD-10-CM | POA: Diagnosis not present

## 2014-11-01 DIAGNOSIS — D649 Anemia, unspecified: Secondary | ICD-10-CM

## 2014-11-01 DIAGNOSIS — L89519 Pressure ulcer of right ankle, unspecified stage: Secondary | ICD-10-CM

## 2014-11-01 DIAGNOSIS — E039 Hypothyroidism, unspecified: Secondary | ICD-10-CM

## 2014-11-01 DIAGNOSIS — Z9689 Presence of other specified functional implants: Secondary | ICD-10-CM | POA: Diagnosis present

## 2014-11-01 DIAGNOSIS — L89220 Pressure ulcer of left hip, unstageable: Secondary | ICD-10-CM | POA: Diagnosis present

## 2014-11-01 DIAGNOSIS — L89629 Pressure ulcer of left heel, unspecified stage: Secondary | ICD-10-CM | POA: Diagnosis not present

## 2014-11-01 DIAGNOSIS — L89319 Pressure ulcer of right buttock, unspecified stage: Secondary | ICD-10-CM | POA: Diagnosis present

## 2014-11-01 DIAGNOSIS — M86161 Other acute osteomyelitis, right tibia and fibula: Secondary | ICD-10-CM | POA: Diagnosis not present

## 2014-11-01 DIAGNOSIS — R109 Unspecified abdominal pain: Secondary | ICD-10-CM | POA: Diagnosis not present

## 2014-11-01 DIAGNOSIS — L89329 Pressure ulcer of left buttock, unspecified stage: Secondary | ICD-10-CM

## 2014-11-01 DIAGNOSIS — F329 Major depressive disorder, single episode, unspecified: Secondary | ICD-10-CM | POA: Diagnosis present

## 2014-11-01 DIAGNOSIS — K529 Noninfective gastroenteritis and colitis, unspecified: Secondary | ICD-10-CM | POA: Diagnosis present

## 2014-11-01 DIAGNOSIS — M79606 Pain in leg, unspecified: Secondary | ICD-10-CM | POA: Diagnosis present

## 2014-11-01 DIAGNOSIS — R197 Diarrhea, unspecified: Secondary | ICD-10-CM

## 2014-11-01 DIAGNOSIS — C50311 Malignant neoplasm of lower-inner quadrant of right female breast: Secondary | ICD-10-CM | POA: Diagnosis present

## 2014-11-01 DIAGNOSIS — E780 Pure hypercholesterolemia: Secondary | ICD-10-CM | POA: Diagnosis present

## 2014-11-01 DIAGNOSIS — M86171 Other acute osteomyelitis, right ankle and foot: Secondary | ICD-10-CM | POA: Diagnosis not present

## 2014-11-01 DIAGNOSIS — F419 Anxiety disorder, unspecified: Secondary | ICD-10-CM | POA: Diagnosis present

## 2014-11-01 DIAGNOSIS — M1990 Unspecified osteoarthritis, unspecified site: Secondary | ICD-10-CM | POA: Diagnosis present

## 2014-11-01 DIAGNOSIS — L89152 Pressure ulcer of sacral region, stage 2: Secondary | ICD-10-CM | POA: Diagnosis present

## 2014-11-01 DIAGNOSIS — R1084 Generalized abdominal pain: Secondary | ICD-10-CM

## 2014-11-01 DIAGNOSIS — K219 Gastro-esophageal reflux disease without esophagitis: Secondary | ICD-10-CM | POA: Diagnosis present

## 2014-11-01 DIAGNOSIS — Z91041 Radiographic dye allergy status: Secondary | ICD-10-CM | POA: Diagnosis not present

## 2014-11-01 DIAGNOSIS — L89892 Pressure ulcer of other site, stage 2: Secondary | ICD-10-CM | POA: Diagnosis not present

## 2014-11-01 DIAGNOSIS — L89219 Pressure ulcer of right hip, unspecified stage: Secondary | ICD-10-CM | POA: Diagnosis present

## 2014-11-01 DIAGNOSIS — Z882 Allergy status to sulfonamides status: Secondary | ICD-10-CM | POA: Diagnosis not present

## 2014-11-01 DIAGNOSIS — G8929 Other chronic pain: Secondary | ICD-10-CM | POA: Diagnosis present

## 2014-11-01 DIAGNOSIS — F17210 Nicotine dependence, cigarettes, uncomplicated: Secondary | ICD-10-CM | POA: Diagnosis present

## 2014-11-01 DIAGNOSIS — L89159 Pressure ulcer of sacral region, unspecified stage: Secondary | ICD-10-CM

## 2014-11-01 DIAGNOSIS — Z96652 Presence of left artificial knee joint: Secondary | ICD-10-CM | POA: Diagnosis present

## 2014-11-01 DIAGNOSIS — G629 Polyneuropathy, unspecified: Secondary | ICD-10-CM | POA: Diagnosis present

## 2014-11-01 DIAGNOSIS — I10 Essential (primary) hypertension: Secondary | ICD-10-CM

## 2014-11-01 DIAGNOSIS — D7589 Other specified diseases of blood and blood-forming organs: Secondary | ICD-10-CM | POA: Diagnosis present

## 2014-11-01 DIAGNOSIS — Z170 Estrogen receptor positive status [ER+]: Secondary | ICD-10-CM | POA: Diagnosis not present

## 2014-11-01 DIAGNOSIS — D638 Anemia in other chronic diseases classified elsewhere: Secondary | ICD-10-CM | POA: Diagnosis present

## 2014-11-01 DIAGNOSIS — R140 Abdominal distension (gaseous): Secondary | ICD-10-CM | POA: Diagnosis not present

## 2014-11-01 DIAGNOSIS — R32 Unspecified urinary incontinence: Secondary | ICD-10-CM | POA: Diagnosis present

## 2014-11-01 DIAGNOSIS — Z79891 Long term (current) use of opiate analgesic: Secondary | ICD-10-CM | POA: Diagnosis not present

## 2014-11-01 DIAGNOSIS — Z79811 Long term (current) use of aromatase inhibitors: Secondary | ICD-10-CM | POA: Diagnosis not present

## 2014-11-01 DIAGNOSIS — L89890 Pressure ulcer of other site, unstageable: Secondary | ICD-10-CM | POA: Diagnosis present

## 2014-11-01 DIAGNOSIS — Z886 Allergy status to analgesic agent status: Secondary | ICD-10-CM | POA: Diagnosis not present

## 2014-11-01 DIAGNOSIS — N390 Urinary tract infection, site not specified: Secondary | ICD-10-CM | POA: Diagnosis present

## 2014-11-01 DIAGNOSIS — L89154 Pressure ulcer of sacral region, stage 4: Secondary | ICD-10-CM | POA: Diagnosis not present

## 2014-11-01 DIAGNOSIS — L89610 Pressure ulcer of right heel, unstageable: Secondary | ICD-10-CM | POA: Diagnosis present

## 2014-11-01 DIAGNOSIS — L89620 Pressure ulcer of left heel, unstageable: Secondary | ICD-10-CM | POA: Diagnosis present

## 2014-11-01 DIAGNOSIS — M62838 Other muscle spasm: Secondary | ICD-10-CM | POA: Diagnosis present

## 2014-11-01 LAB — BASIC METABOLIC PANEL
ANION GAP: 9 (ref 5–15)
Anion gap: 12 (ref 5–15)
BUN: 11 mg/dL (ref 6–20)
BUN: 9 mg/dL (ref 6–20)
CALCIUM: 8.3 mg/dL — AB (ref 8.9–10.3)
CO2: 31 mmol/L (ref 22–32)
CO2: 34 mmol/L — AB (ref 22–32)
CREATININE: 0.91 mg/dL (ref 0.44–1.00)
CREATININE: 0.81 mg/dL (ref 0.44–1.00)
Calcium: 8.2 mg/dL — ABNORMAL LOW (ref 8.9–10.3)
Chloride: 97 mmol/L — ABNORMAL LOW (ref 101–111)
Chloride: 96 mmol/L — ABNORMAL LOW (ref 101–111)
GFR calc Af Amer: >60 mL/min (ref >60)
GFR calc non Af Amer: >60 mL/min (ref >60)
GFR calc non Af Amer: >60 mL/min (ref >60)
GLUCOSE: 91 mg/dL (ref 65–99)
Glucose, Bld: 87 mg/dL (ref 65–99)
POTASSIUM: 3.1 mmol/L — AB (ref 3.5–5.1)
Potassium: 2.9 mmol/L — ABNORMAL LOW (ref 3.5–5.1)
SODIUM: 139 mmol/L (ref 135–145)
Sodium: 140 mmol/L (ref 135–145)

## 2014-11-01 LAB — PHOSPHORUS: PHOSPHORUS: 3.1 mg/dL (ref 2.5–4.6)

## 2014-11-01 LAB — URINALYSIS, ROUTINE W REFLEX MICROSCOPIC
Bilirubin Urine: NEGATIVE
Glucose, UA: NEGATIVE mg/dL
HGB URINE DIPSTICK: NEGATIVE
KETONES UR: NEGATIVE mg/dL
NITRITE: NEGATIVE
Protein, ur: NEGATIVE mg/dL
Specific Gravity, Urine: 1.016 (ref 1.005–1.030)
Urobilinogen, UA: 1 mg/dL (ref 0.0–1.0)
pH: 8.5 — ABNORMAL HIGH (ref 5.0–8.0)

## 2014-11-01 LAB — CBC WITH DIFFERENTIAL/PLATELET
Basophils Absolute: 0 10*3/uL (ref 0.0–0.1)
Basophils Relative: 0 % (ref 0–1)
Eosinophils Absolute: 0 10*3/uL (ref 0.0–0.7)
Eosinophils Relative: 0 % (ref 0–5)
HCT: 27.6 % — ABNORMAL LOW (ref 36.0–46.0)
Hemoglobin: 8.8 g/dL — ABNORMAL LOW (ref 12.0–15.0)
LYMPHS PCT: 9 % — AB (ref 12–46)
Lymphs Abs: 1.2 10*3/uL (ref 0.7–4.0)
MCH: 30.6 pg (ref 26.0–34.0)
MCHC: 31.9 g/dL (ref 30.0–36.0)
MCV: 95.8 fL (ref 78.0–100.0)
MONO ABS: 0.8 10*3/uL (ref 0.1–1.0)
Monocytes Relative: 6 % (ref 3–12)
NEUTROS PCT: 85 % — AB (ref 43–77)
Neutro Abs: 11.4 10*3/uL — ABNORMAL HIGH (ref 1.7–7.7)
PLATELETS: 408 10*3/uL — AB (ref 150–400)
RBC: 2.88 MIL/uL — ABNORMAL LOW (ref 3.87–5.11)
RDW: 14 % (ref 11.5–15.5)
WBC: 13.4 10*3/uL — AB (ref 4.0–10.5)

## 2014-11-01 LAB — URINE MICROSCOPIC-ADD ON

## 2014-11-01 LAB — CBC
HCT: 28 % — ABNORMAL LOW (ref 36.0–46.0)
HEMOGLOBIN: 9 g/dL — AB (ref 12.0–15.0)
MCH: 30.5 pg (ref 26.0–34.0)
MCHC: 32.1 g/dL (ref 30.0–36.0)
MCV: 94.9 fL (ref 78.0–100.0)
Platelets: 410 10*3/uL — ABNORMAL HIGH (ref 150–400)
RBC: 2.95 MIL/uL — ABNORMAL LOW (ref 3.87–5.11)
RDW: 14 % (ref 11.5–15.5)
WBC: 12.9 10*3/uL — ABNORMAL HIGH (ref 4.0–10.5)

## 2014-11-01 LAB — FERRITIN: FERRITIN: 1029 ng/mL — AB (ref 11–307)

## 2014-11-01 LAB — MAGNESIUM: Magnesium: 2 mg/dL (ref 1.7–2.4)

## 2014-11-01 LAB — PREALBUMIN: Prealbumin: 8.2 mg/dL — ABNORMAL LOW (ref 18–38)

## 2014-11-01 MED ORDER — SODIUM CHLORIDE 0.9 % IV SOLN
INTRAVENOUS | Status: DC
  Administered 2014-11-01: 10:00:00 via INTRAVENOUS

## 2014-11-01 MED ORDER — PANTOPRAZOLE SODIUM 40 MG PO TBEC
40.0000 mg | DELAYED_RELEASE_TABLET | Freq: Every day | ORAL | Status: DC
  Administered 2014-11-01 – 2014-11-05 (×4): 40 mg via ORAL
  Filled 2014-11-01 (×5): qty 1

## 2014-11-01 MED ORDER — ZINC SULFATE 220 (50 ZN) MG PO CAPS
220.0000 mg | ORAL_CAPSULE | Freq: Every day | ORAL | Status: DC
  Administered 2014-11-02 – 2014-11-08 (×7): 220 mg via ORAL
  Filled 2014-11-01 (×8): qty 1

## 2014-11-01 MED ORDER — PIPERACILLIN-TAZOBACTAM 3.375 G IVPB
3.3750 g | Freq: Three times a day (TID) | INTRAVENOUS | Status: DC
  Administered 2014-11-01 – 2014-11-07 (×18): 3.375 g via INTRAVENOUS
  Filled 2014-11-01 (×22): qty 50

## 2014-11-01 MED ORDER — VANCOMYCIN HCL IN DEXTROSE 750-5 MG/150ML-% IV SOLN
750.0000 mg | Freq: Two times a day (BID) | INTRAVENOUS | Status: DC
  Administered 2014-11-01 – 2014-11-08 (×16): 750 mg via INTRAVENOUS
  Filled 2014-11-01 (×19): qty 150

## 2014-11-01 MED ORDER — POTASSIUM CHLORIDE 10 MEQ/100ML IV SOLN
10.0000 meq | INTRAVENOUS | Status: AC
  Administered 2014-11-01 (×3): 10 meq via INTRAVENOUS
  Filled 2014-11-01 (×3): qty 100

## 2014-11-01 MED ORDER — IOHEXOL 300 MG/ML  SOLN
50.0000 mL | Freq: Once | INTRAMUSCULAR | Status: AC | PRN
  Administered 2014-11-01: 50 mL via ORAL

## 2014-11-01 MED ORDER — ADULT MULTIVITAMIN W/MINERALS CH
1.0000 | ORAL_TABLET | Freq: Every day | ORAL | Status: DC
  Administered 2014-11-02 – 2014-11-08 (×7): 1 via ORAL
  Filled 2014-11-01 (×7): qty 1

## 2014-11-01 MED ORDER — PIPERACILLIN-TAZOBACTAM 3.375 G IVPB 30 MIN
3.3750 g | Freq: Once | INTRAVENOUS | Status: AC
Start: 2014-11-01 — End: 2014-11-01
  Administered 2014-11-01: 3.375 g via INTRAVENOUS
  Filled 2014-11-01: qty 50

## 2014-11-01 MED ORDER — IOHEXOL 300 MG/ML  SOLN
80.0000 mL | Freq: Once | INTRAMUSCULAR | Status: AC | PRN
  Administered 2014-11-01: 80 mL via INTRAVENOUS

## 2014-11-01 NOTE — Progress Notes (Signed)
Subjective:    Currently, the patient is feeling better in terms of her abdominal pain. She continues to endorse that her symptoms are related to "gas," but notes that she has not had as much pain since her admission. She does endorse of continued lose stools, as well as incontinence of urine.  She confirmed that at her recent visit with her PCP, her thyroid levels were normal on her currant regimen of Synthroid.  Interval Events: Stools x2 O/N with no diarrhea since AM. Negative C. Dif. CT Abd/Pelvis negative for acute intra-abdominal process. Possible bladder wall thickening suggestive of UTI. UA alkali, cloudy with rare bacteria and moderate WBCs, neg Nitrite, rare squams PT saw and provided recs (see below)    Objective:    Vital Signs:   Temp:  [98.2 F (36.8 C)-99 F (37.2 C)] 98.6 F (37 C) (07/27 0957) Pulse Rate:  [94-106] 106 (07/27 0957) Resp:  [16-19] 18 (07/27 0448) BP: (125-163)/(54-74) 147/63 mmHg (07/27 0957) SpO2:  [93 %-99 %] 93 % (07/27 0957) Weight:  [77.111 kg (170 lb)] 77.111 kg (170 lb) (07/26 1233) Last BM Date: 10/31/14  24-hour weight change: Weight change:   Intake/Output:   Intake/Output Summary (Last 24 hours) at 11/01/14 1207 Last data filed at 11/01/14 0830  Gross per 24 hour  Intake    240 ml  Output      0 ml  Net    240 ml      Physical Exam: Constitutional: She is oriented to person, place, and time. She appears well-developed and well-nourished. Lying in bed in no acute distress  Head: Normocephalic and atraumatic.  Eyes: EOM are normal.  Cardiovascular: Regular rhythm, normal heart sounds and intact distal pulses. Borderline tachycardic.  Pulmonary/Chest: Effort normal and breath sounds normal. No respiratory distress. She has no wheezes.  Abdominal: Rigid. Mildly TTP to in all quadrants, Upper > Lower. No gaurding, no rebound.  GU: Incontinent of urine. No foley in place. Neurological: She is alert and oriented to person,  place, and time.  Skin: Skin is warm and dry. Ulcers x4: -- Superior Gluteal Crease: Not visualized today (Previous Exam: 1-2 cm stage 2 decubitus ulcer with pink base. No drainage or surrounding erythema.) -- Left Buttock: Not visualized today. (Previous Exam: Large, >5 cm, stage 3-4 decubitus ulcer, 1-2 cm deep, not fully visualized due to patient position. Wound is bandaged, packed with gauze. No drainage appreciated.) -- Left Lateral Heel: 2-3 mm ulcer draining small amount of serosanguinous fluid. -- Right Mid-Calf: 4 cm ulcer with fibrinous exudate and eschar overlying. Rolled border without surrounding erythema.  Labs:  Basic Metabolic Panel:  Recent Labs Lab 10/31/14 2111 11/01/14 0049 11/01/14 0540  NA 141 140 139  K 2.9* 3.1* 2.9*  CL 94* 97* 96*  CO2 33* 31 34*  GLUCOSE 101* 91 87  BUN 10 11 9   CREATININE 0.84 0.91 0.81  CALCIUM 8.5* 8.2* 8.3*  MG 2.0  --   --   PHOS 3.1  --   --     Liver Function Tests:  Recent Labs Lab 10/31/14 2111  AST 25  ALT 12*  ALKPHOS 54  BILITOT 0.8  PROT 7.1  ALBUMIN 2.0*    Recent Labs Lab 10/31/14 2111  LIPASE 21*   CBC:  Recent Labs Lab 10/31/14 2111 11/01/14 0049 11/01/14 0540  WBC 15.7* 13.4* 12.9*  NEUTROABS 13.7* 11.4*  --   HGB 8.9* 8.8* 9.0*  HCT 27.8* 27.6* 28.0*  MCV 94.9 95.8  94.9  PLT 434* 408* 410*   Microbiology: Results for orders placed or performed during the hospital encounter of 10/31/14  Clostridium Difficile by PCR (not at [facility])     Status: None   Collection Time: 10/31/14  4:17 PM  Result Value Ref Range Status   C difficile by pcr NEGATIVE NEGATIVE Final  Culture, blood (routine x 2)     Status: None (Preliminary result)   Collection Time: 11/01/14 12:41 AM  Result Value Ref Range Status   Specimen Description BLOOD LEFT ANTECUBITAL  Final   Special Requests BOTTLES DRAWN AEROBIC AND ANAEROBIC 4CC EA  Final   Culture PENDING  Incomplete   Report Status PENDING  Incomplete    Other results: EKG: sinus tachycardia, possible LVH, otherwise normal and unchanged from previous.  Imaging: Ct Abdomen Pelvis W Contrast  11/01/2014   CLINICAL DATA:  Right and left upper quadrant pain. History of paraplegia with sacral decubitus ulcer.  EXAM: CT ABDOMEN AND PELVIS WITH CONTRAST  TECHNIQUE: Multidetector CT imaging of the abdomen and pelvis was performed using the standard protocol following bolus administration of intravenous contrast.  CONTRAST:  18mL OMNIPAQUE IOHEXOL 300 MG/ML SOLN, 64mL OMNIPAQUE IOHEXOL 300 MG/ML SOLN  COMPARISON:  None.  FINDINGS: Lower chest:  The included lung bases are clear.  Hepatobiliary: The liver is prominent in size with decreased density. Subcentimeter 8 mm hypodensity in the right hepatic lobe. Gallbladder is physiologically distended. No calcified gallstones. Mild diffuse prominence of the common bile duct measuring 9 mm in the mid distal portion. No calcified choledocholithiasis.  Pancreas: Mild atrophy without ductal dilatation or surrounding inflammation.  Spleen: Normal in size with scattered splenic granulomas.  Adrenals/Urinary Tract: Asymmetric renal size with right kidney smaller than left and thinning of the right renal parenchyma. Bilateral nonobstructing renal calculi. No hydronephrosis or obstructive uropathy. Simple cyst in the upper left kidney measures 1.4 cm. No adrenal nodule.  Stomach/Bowel: Stomach is decompressed. There are no dilated or thickened small bowel loops. No obstruction, oral contrast is seen throughout the colon. Small volume of colonic stool. The appendix is normal.  Vascular/Lymphatic: No retroperitoneal adenopathy. Abdominal aorta is normal in caliber. Calcified and noncalcified atheromatous plaque about the abdominal aorta and its branches, no aneurysm. Prominent left inguinal lymph nodes.  Reproductive: Uterus surgically absent.  No adnexal mass.  Bladder: Physiologically distended with questionable bladder wall  thickening.  Other: Soft tissue air and induration about the posterior medial left upper thigh, partially included. Scattered foci of air in the left flank soft tissues. Soft tissue edema posterior to the sacrum.  Musculoskeletal: Multilevel degenerative change throughout the lumbar spine. There are no acute or suspicious osseous abnormalities.  IMPRESSION: 1. Soft tissue air and induration about the posterior medial left upper thigh, partially included. This is likely related to decubitus ulcer. Scattered air in the left flank soft tissues. 2. Bilateral nonobstructing renal calculi. Question of bladder wall thickening, correlation with urinalysis recommended to exclude urinary tract infection. Asymmetric renal size with scarring in the right kidney. 3. Prominence of the extrahepatic biliary tree without definable cause. No gallbladder distention. 4. Hepatomegaly and hepatic steatosis. 5. Atherosclerosis.   Electronically Signed   By: Jeb Levering M.D.   On: 11/01/2014 04:26       Medications:    Infusions: . sodium chloride 75 mL/hr at 11/01/14 1020    Scheduled Medications: . anastrozole  1 mg Oral QHS  . baclofen  30 mg Oral BID  . DULoxetine  60  mg Oral QPC supper  . enoxaparin (LOVENOX) injection  40 mg Subcutaneous Q24H  . folic acid  1 mg Oral QPC supper  . gabapentin  1,600 mg Oral BID  . levothyroxine  125 mcg Oral QAC breakfast  . metoprolol  100 mg Oral QPC supper  . morphine  60 mg Oral Q12H  . NIFEdipine  30 mg Oral Daily  . pantoprazole  40 mg Oral Daily  . piperacillin-tazobactam (ZOSYN)  IV  3.375 g Intravenous 3 times per day  . vancomycin  750 mg Intravenous Q12H    PRN Medications: acetaminophen, loperamide, oxyCODONE-acetaminophen **AND** oxyCODONE, simethicone   Assessment/ Plan:    Pt is a 64 y.o. yo female with HTN, HLD, hypothyroidism, chronic pain, paraplegia, and h/o breast CA (ER/PR+),  who was admitted on 10/31/2014 with symptoms of abdominal pain and  worsening decubitus ulcers, who was found to be anemic in the ED. Interventions at this time will be focused on resolution of her abd pain, workup and management of her anemia, and wound care for her ulcers.   Abdominal Pain/Diarrhea: Patient's abdomen is tense, with difficulty relaxing, but there is no guarding or significant TTP. This may be her baseline give her LE neurological deficits. Pain has improved significantly O/N, and diarrhea has eased without further episodes today. CT scan negative for acute abdomen, billiary etiology, or significant constipation. Symptomology and history suggestive of possible GERD plus gas pain, and the patient endorses improvement with Protonix..C diff negative.CT scan demonstrated bladder wall thinkening and UA was positive for rare bacteria and WBCs. Will treat UTI empirically with continued broad spectrum abx also covering for possible polymicrobial ulcers.  -- CT abd/pelvis negative for acute process, possible UTI -- UA + for WBCs/Rare Bac -- Continue Vanc/Zosyn as below -- Continue loperamide PRN diarrhea -- Simethicone PRN gas pain -- ADAT, begin with clears  Decubitus Ulcers: ED notes gluteal ulcer extends to muscle (stage 4). Other ulcers stage 1-2. Possible explanation for WBC count, though UTI may be responsible. CT adb/pelvis characterizes ulcer as involving soft tissues only, no bone involvement.Wound care team managing ulcer care with foley in place to keep wound clean. Plastics consulted will await recs. -- Wound care consult, appreciate recs:  - likely benefit from surgical debridement of necrotic tissue to right ankle and left ischium  - Moist gause dressing to right ankle/left ischium, float heels, foam dressing to sascrum/left heel  - Recommend Nutrition Consult to maximize protien intake and optimal wound healing -- Plastics consult, appreciate recs (unofficial):  - Likely infected with expression of puss  - Likely surgical debridement and  deep culture of ischial and right ankle wounds -- BCx on 7/26: PENDING -- Continue Vanc/Zosyn empirically until definitive wound culture can be obtained by Plastics -- Indwelling foley urinary catheter for incontinence to keep wounds dry  Anemia: Hgb currently 9.0 (Baseline in 01/2014 13 --> 8.8). MCV 94, patient already on Folic acid supplementation. Per patient, history of B12 and Iron supplementation in the past, no current therapy. No evidence of bleeding on exam. No c/o blood in stool. Awaiting records from PCP about history of anemia and treatments. -- Follow CBC in AM -- Ferritin elevated at 1149, likely anemia of chronic disease f/u with Iron Panel  HTN: stable. Continue home meds. -- Nifedipine 30 mg -- Metoprolol Succ 100 mg   Hypothyroid: stable. Reports PCP recently verified therapeutic treatment -- Levothyroxine 125 mcg daily  Paraplegia:  -- Baclofen 20 mg BID -- Gabapentin 1600 mg  BID -- PT/OT Evaluation, appreciate recs:  OT  -- Air Matress Overlay to relieve pressure on wounds  -- UNI Boots BLE to prevent further pressure wounds  -- Dispo to SNF vs Home (pending Wound Care Recs)  [facility] PT at Discharge with 24 hr supervision/assistance vs SNF (pending wound care recs)  Depression: -- Cymbalta  H/o Breast Cancer: stable. Last mammogram Jan 2015. Followed by Susanne Borders NP ([place]). -- Anastrozole 1 mg daily  FEN/GI -- ADAT, currently regular diet -- Replete K, currently 2.9  DVT Ppx: Lovenox   CODE STATUS - Full  CONSULTS PLACED - Wound Care, Plastics  DISPO - Disposition is deferred at this time, awaiting improvement of UTI, Anemia, and Wound Care Management.   Anticipated discharge date deferred at this time.   The patient does have a current PCP [name], MD) and does need an [facility] hospital follow-up appointment after discharge.    Is the [facility] hospital follow-up appointment a one-time only appointment? no.  Does  the patient have transportation limitations that hinder transportation to clinic appointments? no   SERVICE NEEDED AT [place]         Y = Yes, Blank = No PT: Y  OT: Y  RN:   Equipment: Y Doctor, general practice, Hospital Bed)  Other:      Length of Stay:  0 day(s)   This is a Careers information officer Note.  The care of the patient was discussed with Dr. Lynnae January and the assessment and plan formulated with their assistance.  Please see their attached note for official documentation of the daily encounter.  Effie Berkshire, MS4 Pager: [phone] (7AM-5PM) 11/01/2014, 12:07 PM

## 2014-11-01 NOTE — Consult Note (Addendum)
WOC wound consult note Reason for Consult: Consult requested for multiple wounds.  Pt states she has been followed by home health.  Recently the wound began to have increased odor and drainage to left ischium. Wound type: Left ischium with unstageable pressure injury; 80% loose eschar, 20% red, strong foul odor, large amt thick tan pus draining when probed with a swab.  7X8X5cm.  Sacrum with 3 areas of stage 2 pressure injury; total area is 2X.3X.1cm, wounds are pink and dry without odor or drainage. Left heel with deep tissue injury; 4X6cm dark purple blister with skin intact, no odor or drainage.  Right ankle with unstageable wound; 9X8cm, 80% eschar, 20% loose yellow slough, small amt yellow drainage, no odor.   Pressure Ulcer POA: Yes Dressing procedure/placement/frequency: Plastics team consult is pending; pt could benefit from surgical debridement of necrotic tissue to right ankle and left ischium.  Moist gauze until further orders available.  Air mattress ordered to reduce pressure.  Float heels, foam dressing to sacrum and left heel to protect from further injury, nutrition consult to optimize protein intake.  Discussed plan of care with patient and family members at bedside.  Please refer to plastics for further questions. Please re-consult if further assistance is needed.  Thank-you,  Julien Girt MSN, [place], [place], [place], [place]

## 2014-11-01 NOTE — Evaluation (Signed)
Occupational Therapy Evaluation Patient Details Name: [name] MRN: [id number] DOB: [birth year] Today's Date: 11/01/2014    History of Present Illness 64 yo female admitted with abdominal pain and sacral decubitus ulcer. PMH: paraplegia, Breast cancer, chronic pain, HTN, HLD   Clinical Impression   PT admitted with abdominal pain and sacral wound. Pt currently with functional limitiations due to the deficits listed below (see OT problem list). PTA requires (A) from spouse for all adls.  Pt will benefit from skilled OT to increase their independence and safety with adls and balance to allow discharge SNf vs home. Pt could benefit from OT to address w/c needs, transfers with spouse to prevent shear injuries to the wound area and activity tolerance for adls.   RECOMMEND UNI BOOTS BIL LE to prevent further pressure wounds ( order from ortho tech) Mattress overlay to prevent further pressure wounds  WOUND CARE CONSULT- to address R LE ankle necrotic appearing tissue and very large gluteal fold ulcer      Follow Up Recommendations  Other (comment) (SNF vs home? depending on wound care consult)    Equipment Recommendations  Wheelchair cushion (measurements OT);Hospital bed;Other (comment)    Recommendations for Other Services       Precautions / Restrictions Precautions Precautions: Fall;Other (comment) (PRESSURE WOUNDS)      Mobility Bed Mobility Overal bed mobility: Needs Assistance Bed Mobility: Supine to Sit;Sit to Supine     Supine to sit: +2 for physical assistance;Max assist Sit to supine: +2 for physical assistance;Max assist   General bed mobility comments: Pt required total +2 to safety complete transfer to avoid shearing at the L gluteal fold ulcer. Pt static sitting with UB support supervision. Pt reports "it feels good to sit up" Pt requires (A) to log roll R and L for peri care and demonstrates spontaneous spastic LE movement. pt with wounds noted on R  lateral aspect of ankle and L heel   Transfers                 General transfer comment: not attempts pt is paraplegic    Balance                                            ADL Overall ADL's : At baseline                                       General ADL Comments: pt requires husband (A) at baseline. pt complete log roll R and L with (A) this session for peri care. Pt unaware of void of bowel. pt with foley      Vision     Perception     Praxis      Pertinent Vitals/Pain Pain Assessment: No/denies pain     Hand Dominance Right   Extremity/Trunk Assessment Upper Extremity Assessment Upper Extremity Assessment: Overall WFL for tasks assessed   Lower Extremity Assessment Lower Extremity Assessment: Defer to PT evaluation;LLE deficits/detail;RLE deficits/detail   Cervical / Trunk Assessment Cervical / Trunk Assessment: Other exceptions (surg cervical)   Communication Communication Communication: No difficulties   Cognition Arousal/Alertness: Awake/alert Behavior During Therapy: WFL for tasks assessed/performed Overall Cognitive Status: Within Functional Limits for tasks assessed  General Comments       Exercises       Shoulder Instructions      Home Living Family/patient expects to be discharged to:: Private residence   Available Help at Discharge: Family;Available 24 hours/day Type of Home: House             Bathroom Shower/Tub: Other (comment) (SPONGE BATH)         Home Equipment: Wheelchair - manual;Hospital bed;Transport chair   Additional Comments: pt and spouse have hoyer lift at home but prefer to transfer with squat pivot method. Pt remains in w/c during the day without cushion.       Prior Functioning/Environment Level of Independence: Needs assistance  Gait / Transfers Assistance Needed: total (A) for all transfers/ no ambulation/ paraplegia ADL's / Homemaking  Assistance Needed: able to complete adls supine in bed due to inability to transfer into shower any more. Spouse states "when she stopped getting in the shower thats when her skin went bad". Pt wears pullups at home and then returns supine to doff and don new pair by spouse        OT Diagnosis: Generalized weakness;Acute pain   OT Problem List: Decreased strength;Decreased activity tolerance;Impaired balance (sitting and/or standing);Decreased safety awareness;Decreased knowledge of use of DME or AE;Decreased knowledge of precautions   OT Treatment/Interventions: Self-care/ADL training;Therapeutic exercise;DME and/or AE instruction;Therapeutic activities;Balance training;Patient/family education    OT Goals(Current goals can be found in the care plan section) Acute Rehab OT Goals Patient Stated Goal: to return home and watch soap opera OT Goal Formulation: With patient/family Time For Goal Achievement: 11/15/14 Potential to Achieve Goals: Good  OT Frequency: Other (comment) (trial bases 2 times)   Barriers to D/C:    wounds will determine d/c recommendations       Co-evaluation              End of Session Nurse Communication: Mobility status;Precautions  Activity Tolerance: Patient tolerated treatment well Patient left: in bed;with call bell/phone within reach;with family/visitor present   Time: 0762-2633 OT Time Calculation (min): 42 min Charges:  OT General Charges $OT Visit: 1 Procedure OT Evaluation $Initial OT Evaluation Tier I: 1 Procedure G-Codes: OT G-codes **NOT FOR INPATIENT CLASS** Functional Assessment Tool Used: clinical judgement Functional Limitation: Self care Self Care Current Status (H5456): At least 60 percent but less than 80 percent impaired, limited or restricted Self Care Goal Status (Y5638): At least 40 percent but less than 60 percent impaired, limited or restricted  Parke Poisson B 11/01/2014, 12:46 PM  Jeri Modena   OTR/L Pager:  [phone] Office: [phone] .

## 2014-11-01 NOTE — Consult Note (Addendum)
Reason for Consult: several pressure ulcers Referring Physician: Primary team  [name] is an 64 y.o. female.  HPI: The patient is a 64 yrs old bf here for diarrhea that she has been dealing with for several days. Her family is in the room.  She is paraplegic and has developed ischial ulcers with the one on the left much worse than the right.  She also has a left heel and right lateral malleolus wound.  Her CDiff test has been negative to this point.  She continues to have diarrhea as noted on my exam today.  She is not ambulatory and requires assistance with her daily activities.  There is a large escar on the right ankle which be a wound to the bone.  The sacral area is a superficial small wound.  The left ischium is large with significant necrotic tissue.   Past Medical History  Diagnosis Date  . Hypertension   . Difficult intubation     Fastrach #4 LMA then # 7 ETT used in 2006 [facility], cervical laminectomy)  . Hypothyroidism   . Cervical neuropathy     PERIPHERAL NEUROPATHY , HAD CERVICAL FUSION  . PONV (postoperative nausea and vomiting)   . Hypercholesteremia   . Kidney stones 1990's    "twice; passed in hospital on my own" (04/13/2012)  . Arthritis     "all over" (04/13/2012)  . Breast cancer 2011    "right" (04/13/2012)    Past Surgical History  Procedure Laterality Date  . Caldwell luc  ~ 2003    "benign tumor removed from up under gum" (04/13/2012)  . Posterior cervical laminectomy  2006  . Eye surgery  2004    STENTS TO BIL EYES  . Tonsillectomy and adenoidectomy  ~ 1954  . Abdominal hysterectomy  ~ 1976  . Breast lumpectomy  2011    "right" (04/13/2012)  . Dacrocystorhinostomy  ~ 2000    "put stents in my tear ducts; both eyes" (04/13/2012)  . Total knee arthroplasty with revision components  04/13/2012    Procedure: TOTAL KNEE ARTHROPLASTY WITH REVISION COMPONENTS;  Surgeon: Velna Ochs, MD;  Location: [facility] OR;  Service: Orthopedics;  Laterality: Left;    Family  History  Problem Relation Age of Onset  . Heart disease Father     Social History:  reports that she has been smoking Cigarettes.  She has a 12 pack-year smoking history. She has never used smokeless tobacco. She reports that she does not drink alcohol or use illicit drugs.  Allergies:  Allergies  Allergen Reactions  . Ivp Dye [Iodinated Diagnostic Agents] Other (See Comments)    "hot and sweaty and almost passed out"  . Aspirin Hives  . Sulfa Antibiotics Hives    Medications: I have reviewed the patient's current medications.  Results for orders placed or performed during the hospital encounter of 10/31/14 (from the past 48 hour(s))  Clostridium Difficile by PCR (not at [facility])     Status: None   Collection Time: 10/31/14  4:17 PM  Result Value Ref Range   C difficile by pcr NEGATIVE NEGATIVE  CBC with Differential     Status: Abnormal   Collection Time: 10/31/14  9:11 PM  Result Value Ref Range   WBC 15.7 (H) 4.0 - 10.5 K/uL   RBC 2.93 (L) 3.87 - 5.11 MIL/uL   Hemoglobin 8.9 (L) 12.0 - 15.0 g/dL   HCT 42.8 (L) 93.5 - 76.6 %   MCV 94.9 78.0 - 100.0 fL  MCH 30.4 26.0 - 34.0 pg   MCHC 32.0 30.0 - 36.0 g/dL   RDW 14.0 11.5 - 15.5 %   Platelets 434 (H) 150 - 400 K/uL   Neutrophils Relative % 88 (H) 43 - 77 %   Neutro Abs 13.7 (H) 1.7 - 7.7 K/uL   Lymphocytes Relative 7 (L) 12 - 46 %   Lymphs Abs 1.2 0.7 - 4.0 K/uL   Monocytes Relative 5 3 - 12 %   Monocytes Absolute 0.8 0.1 - 1.0 K/uL   Eosinophils Relative 0 0 - 5 %   Eosinophils Absolute 0.0 0.0 - 0.7 K/uL   Basophils Relative 0 0 - 1 %   Basophils Absolute 0.0 0.0 - 0.1 K/uL  Basic metabolic panel     Status: Abnormal   Collection Time: 10/31/14  9:11 PM  Result Value Ref Range   Sodium 141 135 - 145 mmol/L   Potassium 2.9 (L) 3.5 - 5.1 mmol/L   Chloride 94 (L) 101 - 111 mmol/L   CO2 33 (H) 22 - 32 mmol/L   Glucose, Bld 101 (H) 65 - 99 mg/dL   BUN 10 6 - 20 mg/dL   Creatinine, Ser 0.84 0.44 - 1.00 mg/dL   Calcium  8.5 (L) 8.9 - 10.3 mg/dL   GFR calc non Af Amer >60 >60 mL/min   GFR calc Af Amer >60 >60 mL/min    Comment: (NOTE) The eGFR has been calculated using the CKD EPI equation. This calculation has not been validated in all clinical situations. eGFR's persistently <60 mL/min signify possible Chronic Kidney Disease.    Anion gap 14 5 - 15  Sedimentation rate     Status: Abnormal   Collection Time: 10/31/14  9:11 PM  Result Value Ref Range   Sed Rate 135 (H) 0 - 22 mm/hr  C-reactive protein     Status: Abnormal   Collection Time: 10/31/14  9:11 PM  Result Value Ref Range   CRP 28.3 (H) <1.0 mg/dL  Lipase, blood     Status: Abnormal   Collection Time: 10/31/14  9:11 PM  Result Value Ref Range   Lipase 21 (L) 22 - 51 U/L  Hepatic function panel     Status: Abnormal   Collection Time: 10/31/14  9:11 PM  Result Value Ref Range   Total Protein 7.1 6.5 - 8.1 g/dL   Albumin 2.0 (L) 3.5 - 5.0 g/dL   AST 25 15 - 41 U/L   ALT 12 (L) 14 - 54 U/L   Alkaline Phosphatase 54 38 - 126 U/L   Total Bilirubin 0.8 0.3 - 1.2 mg/dL   Bilirubin, Direct 0.2 0.1 - 0.5 mg/dL   Indirect Bilirubin 0.6 0.3 - 0.9 mg/dL  Magnesium     Status: None   Collection Time: 10/31/14  9:11 PM  Result Value Ref Range   Magnesium 2.0 1.7 - 2.4 mg/dL  Phosphorus     Status: None   Collection Time: 10/31/14  9:11 PM  Result Value Ref Range   Phosphorus 3.1 2.5 - 4.6 mg/dL  Culture, blood (routine x 2)     Status: None (Preliminary result)   Collection Time: 11/01/14 12:41 AM  Result Value Ref Range   Specimen Description BLOOD LEFT ANTECUBITAL    Special Requests BOTTLES DRAWN AEROBIC AND ANAEROBIC 4CC EA    Culture PENDING    Report Status PENDING   Basic metabolic panel     Status: Abnormal   Collection Time: 11/01/14 12:49 AM  Result Value Ref Range   Sodium 140 135 - 145 mmol/L   Potassium 3.1 (L) 3.5 - 5.1 mmol/L   Chloride 97 (L) 101 - 111 mmol/L   CO2 31 22 - 32 mmol/L   Glucose, Bld 91 65 - 99 mg/dL    BUN 11 6 - 20 mg/dL   Creatinine, Ser 0.91 0.44 - 1.00 mg/dL   Calcium 8.2 (L) 8.9 - 10.3 mg/dL   GFR calc non Af Amer >60 >60 mL/min   GFR calc Af Amer >60 >60 mL/min    Comment: (NOTE) The eGFR has been calculated using the CKD EPI equation. This calculation has not been validated in all clinical situations. eGFR's persistently <60 mL/min signify possible Chronic Kidney Disease.    Anion gap 12 5 - 15  CBC with Differential/Platelet     Status: Abnormal   Collection Time: 11/01/14 12:49 AM  Result Value Ref Range   WBC 13.4 (H) 4.0 - 10.5 K/uL    Comment: WHITE COUNT CONFIRMED ON SMEAR   RBC 2.88 (L) 3.87 - 5.11 MIL/uL   Hemoglobin 8.8 (L) 12.0 - 15.0 g/dL   HCT 27.6 (L) 36.0 - 46.0 %   MCV 95.8 78.0 - 100.0 fL   MCH 30.6 26.0 - 34.0 pg   MCHC 31.9 30.0 - 36.0 g/dL   RDW 14.0 11.5 - 15.5 %   Platelets 408 (H) 150 - 400 K/uL   Neutrophils Relative % 85 (H) 43 - 77 %   Lymphocytes Relative 9 (L) 12 - 46 %   Monocytes Relative 6 3 - 12 %   Eosinophils Relative 0 0 - 5 %   Basophils Relative 0 0 - 1 %   Neutro Abs 11.4 (H) 1.7 - 7.7 K/uL   Lymphs Abs 1.2 0.7 - 4.0 K/uL   Monocytes Absolute 0.8 0.1 - 1.0 K/uL   Eosinophils Absolute 0.0 0.0 - 0.7 K/uL   Basophils Absolute 0.0 0.0 - 0.1 K/uL   RBC Morphology POLYCHROMASIA PRESENT   Basic metabolic panel     Status: Abnormal   Collection Time: 11/01/14  5:40 AM  Result Value Ref Range   Sodium 139 135 - 145 mmol/L   Potassium 2.9 (L) 3.5 - 5.1 mmol/L   Chloride 96 (L) 101 - 111 mmol/L   CO2 34 (H) 22 - 32 mmol/L   Glucose, Bld 87 65 - 99 mg/dL   BUN 9 6 - 20 mg/dL   Creatinine, Ser 0.81 0.44 - 1.00 mg/dL   Calcium 8.3 (L) 8.9 - 10.3 mg/dL   GFR calc non Af Amer >60 >60 mL/min   GFR calc Af Amer >60 >60 mL/min    Comment: (NOTE) The eGFR has been calculated using the CKD EPI equation. This calculation has not been validated in all clinical situations. eGFR's persistently <60 mL/min signify possible Chronic  Kidney Disease.    Anion gap 9 5 - 15  CBC     Status: Abnormal   Collection Time: 11/01/14  5:40 AM  Result Value Ref Range   WBC 12.9 (H) 4.0 - 10.5 K/uL   RBC 2.95 (L) 3.87 - 5.11 MIL/uL   Hemoglobin 9.0 (L) 12.0 - 15.0 g/dL   HCT 28.0 (L) 36.0 - 46.0 %   MCV 94.9 78.0 - 100.0 fL   MCH 30.5 26.0 - 34.0 pg   MCHC 32.1 30.0 - 36.0 g/dL   RDW 14.0 11.5 - 15.5 %   Platelets 410 (H) 150 - 400 K/uL  Urinalysis, Routine w reflex microscopic (not at [facility])     Status: Abnormal   Collection Time: 11/01/14 11:27 AM  Result Value Ref Range   Color, Urine YELLOW YELLOW   APPearance CLOUDY (A) CLEAR   Specific Gravity, Urine 1.016 1.005 - 1.030   pH 8.5 (H) 5.0 - 8.0   Glucose, UA NEGATIVE NEGATIVE mg/dL   Hgb urine dipstick NEGATIVE NEGATIVE   Bilirubin Urine NEGATIVE NEGATIVE   Ketones, ur NEGATIVE NEGATIVE mg/dL   Protein, ur NEGATIVE NEGATIVE mg/dL   Urobilinogen, UA 1.0 0.0 - 1.0 mg/dL   Nitrite NEGATIVE NEGATIVE   Leukocytes, UA MODERATE (A) NEGATIVE  Urine microscopic-add on     Status: None   Collection Time: 11/01/14 11:27 AM  Result Value Ref Range   Squamous Epithelial / LPF RARE RARE   WBC, UA 3-6 <3 WBC/hpf   Bacteria, UA RARE RARE  Ferritin     Status: Abnormal   Collection Time: 11/01/14 12:31 PM  Result Value Ref Range   Ferritin 1029 (H) 11 - 307 ng/mL  Prealbumin     Status: Abnormal   Collection Time: 11/01/14 12:31 PM  Result Value Ref Range   Prealbumin 8.2 (L) 18 - 38 mg/dL    Ct Abdomen Pelvis W Contrast  11/01/2014   CLINICAL DATA:  Right and left upper quadrant pain. History of paraplegia with sacral decubitus ulcer.  EXAM: CT ABDOMEN AND PELVIS WITH CONTRAST  TECHNIQUE: Multidetector CT imaging of the abdomen and pelvis was performed using the standard protocol following bolus administration of intravenous contrast.  CONTRAST:  20mL OMNIPAQUE IOHEXOL 300 MG/ML SOLN, 38mL OMNIPAQUE IOHEXOL 300 MG/ML SOLN  COMPARISON:  None.  FINDINGS: Lower chest:  The  included lung bases are clear.  Hepatobiliary: The liver is prominent in size with decreased density. Subcentimeter 8 mm hypodensity in the right hepatic lobe. Gallbladder is physiologically distended. No calcified gallstones. Mild diffuse prominence of the common bile duct measuring 9 mm in the mid distal portion. No calcified choledocholithiasis.  Pancreas: Mild atrophy without ductal dilatation or surrounding inflammation.  Spleen: Normal in size with scattered splenic granulomas.  Adrenals/Urinary Tract: Asymmetric renal size with right kidney smaller than left and thinning of the right renal parenchyma. Bilateral nonobstructing renal calculi. No hydronephrosis or obstructive uropathy. Simple cyst in the upper left kidney measures 1.4 cm. No adrenal nodule.  Stomach/Bowel: Stomach is decompressed. There are no dilated or thickened small bowel loops. No obstruction, oral contrast is seen throughout the colon. Small volume of colonic stool. The appendix is normal.  Vascular/Lymphatic: No retroperitoneal adenopathy. Abdominal aorta is normal in caliber. Calcified and noncalcified atheromatous plaque about the abdominal aorta and its branches, no aneurysm. Prominent left inguinal lymph nodes.  Reproductive: Uterus surgically absent.  No adnexal mass.  Bladder: Physiologically distended with questionable bladder wall thickening.  Other: Soft tissue air and induration about the posterior medial left upper thigh, partially included. Scattered foci of air in the left flank soft tissues. Soft tissue edema posterior to the sacrum.  Musculoskeletal: Multilevel degenerative change throughout the lumbar spine. There are no acute or suspicious osseous abnormalities.  IMPRESSION: 1. Soft tissue air and induration about the posterior medial left upper thigh, partially included. This is likely related to decubitus ulcer. Scattered air in the left flank soft tissues. 2. Bilateral nonobstructing renal calculi. Question of bladder  wall thickening, correlation with urinalysis recommended to exclude urinary tract infection. Asymmetric renal size with scarring in the right kidney. 3. Prominence of  the extrahepatic biliary tree without definable cause. No gallbladder distention. 4. Hepatomegaly and hepatic steatosis. 5. Atherosclerosis.   Electronically Signed   By: Jeb Levering M.D.   On: 11/01/2014 04:26    Review of Systems  Eyes: Negative.   Respiratory: Negative.   Cardiovascular: Negative.   Gastrointestinal: Positive for diarrhea.  Genitourinary: Negative.   Skin: Negative.   Neurological: Positive for weakness.  Psychiatric/Behavioral: Negative.    Blood pressure 129/64, pulse 107, temperature 98.5 F (36.9 C), temperature source Oral, resp. rate 18, height $RemoveBe'5\' 3"'[id number]$  (1.6 m), weight 77.111 kg (170 lb), SpO2 98 %. Physical Exam  Constitutional: She is oriented to person, place, and time. She appears well-developed and well-nourished.  HENT:  Head: Normocephalic and atraumatic.  Eyes: Conjunctivae and EOM are normal. Pupils are equal, round, and reactive to light.  Cardiovascular: Normal rate.   Respiratory: Effort normal.  GI: Soft.  Musculoskeletal:       Legs:      Feet:  Neurological: She is alert and oriented to person, place, and time.  Psychiatric: She has a normal mood and affect. Her behavior is normal. Thought content normal.    Assessment/Plan: Recommend maximizing nutritional status with protein supplements provided the bowels gain control.  Multivitamin, vitamin C and zinc daily.  Air mattress bed is important to keep the patient off loaded since she requires full assistance.  Turning at least every 2 hours with padding is essential.  Will need debridement of at least the left ischium and right ankle.  Working on OR schedule.  Prealbumin ~8.  Nutrition consult if not done already. Thank you for the consult.  Center 11/01/2014, 7:23 PM

## 2014-11-01 NOTE — Evaluation (Signed)
Physical Therapy Evaluation Patient Details Name: [name] MRN: [id number] DOB: 1951/01/31 Today's Date: 11/01/2014   History of Present Illness  64 yo female admitted with abdominal pain and sacral decubitus ulcer. PMH: paraplegia, Breast cancer, chronic pain, HTN, HLD  Clinical Impression  Pt non-ambulatory at baseline and spouse has been sole caregiver since 2006. Pt now has a large, deep L gluteal fold ulcer and R lateral ankle necrotic wound. Unclear what L gluteal fold wound was caused from. Could potentially be how spouse is transfering patient, a piece of the w/c due to pt not having a cushion or the w/c not optimally fitting patient. Pt remains to require maxA for transfers and ADLs as she is incontinent of urine and bowel. Acute PT to follow for pressure relief/education until further recommendations are provided for wound care management.  RECOMMEND UNI BOOTS BIL LE to prevent further pressure wounds ( order from ortho tech) Mattress overlay to prevent further pressure wounds  WOUND CARE CONSULT- to address R LE ankle necrotic appearing tissue and very large gluteal fold ulcer    Follow Up Recommendations Home health PT;Supervision/Assistance - 24 hour (vs SNF pending medical recommendations for wounds)    Equipment Recommendations  Wheelchair cushion (measurements PT);Wheelchair (measurements PT) (mattress overlay and w/c assessment)    Recommendations for Other Services       Precautions / Restrictions Precautions Precautions: Fall;Other (comment) (PRESSURE WOUNDS) Precaution Comments: pt deep wound to L gluteal fold and large R lateral necrotic ankle wound Restrictions Weight Bearing Restrictions: No      Mobility  Bed Mobility Overal bed mobility: Needs Assistance;+2 for physical assistance Bed Mobility: Supine to Sit;Sit to Supine     Supine to sit: +2 for physical assistance;Max assist Sit to supine: +2 for physical assistance;Max assist   General bed  mobility comments: Pt required total +2 to safety complete transfer to avoid shearing at the L gluteal fold ulcer. Pt static sitting with UB support supervision. Pt reports "it feels good to sit up" Pt requires (A) to log roll R and L for peri care and demonstrates spontaneous spastic LE movement. pt with wounds noted on R lateral aspect of ankle and L heel   Transfers                 General transfer comment: not attempts pt is paraplegic  Ambulation/Gait                Stairs            Wheelchair Mobility    Modified Rankin (Stroke Patients Only)       Balance Overall balance assessment: Needs assistance Sitting-balance support: Feet supported;Single extremity supported Sitting balance-Leahy Scale: Poor Sitting balance - Comments: pt washed face sitting at bedside but required unilateral support Postural control: Posterior lean                                   Pertinent Vitals/Pain Pain Assessment: No/denies pain    Home Living Family/patient expects to be discharged to:: Private residence Living Arrangements: Spouse/significant other Available Help at Discharge: Family;Available 24 hours/day Type of Home: House       Home Layout: One level Home Equipment: Wheelchair - manual;Hospital bed;Transport chair Additional Comments: pt and spouse have hoyer lift at home but prefer to transfer with squat pivot method. Pt remains in w/c during the day without cushion.     Prior Function  Level of Independence: Needs assistance   Gait / Transfers Assistance Needed: total (A) for all transfers/ no ambulation/ paraplegia  ADL's / Homemaking Assistance Needed: pt does not toliet on commode uses depends. pt able to feed self        Hand Dominance   Dominant Hand: Right    Extremity/Trunk Assessment   Upper Extremity Assessment: Overall WFL for tasks assessed           Lower Extremity Assessment: RLE deficits/detail;LLE  deficits/detail RLE Deficits / Details: pt with no voluntary mvmt and no sensation from belly button down LLE Deficits / Details: pt with no voluntary mvmt and no sensation from belly button down  Cervical / Trunk Assessment: Other exceptions (surg cervical)  Communication   Communication: No difficulties  Cognition Arousal/Alertness: Awake/alert Behavior During Therapy: WFL for tasks assessed/performed Overall Cognitive Status: Within Functional Limits for tasks assessed                      General Comments General comments (skin integrity, edema, etc.): Pt with baseball size L gluteal fold wound and R lateral ankle necrotic wound. pt with no sensation and can not feel them. Pt with spontaneous R LE mvmt dragging foot along bed. pt also reports runing over her feet in the w/c because she can't feel them    Exercises        Assessment/Plan    PT Assessment Patient needs continued PT services  PT Diagnosis Difficulty walking   PT Problem List Decreased strength;Decreased range of motion;Decreased activity tolerance;Decreased balance;Decreased mobility  PT Treatment Interventions Functional mobility training;Therapeutic activities;Therapeutic exercise;Balance training   PT Goals (Current goals can be found in the Care Plan section) Acute Rehab PT Goals Patient Stated Goal: return home PT Goal Formulation: With patient/family Time For Goal Achievement: 11/15/14 Potential to Achieve Goals: Good    Frequency Min 2X/week   Barriers to discharge   unclear if spouse can physically care for patient if she has to be bed ridden due to wounds and if spouse can care for the wounds    Co-evaluation PT/OT/SLP Co-Evaluation/Treatment: Yes Reason for Co-Treatment: Complexity of the patient's impairments (multi-system involvement);For patient/therapist safety PT goals addressed during session: Mobility/safety with mobility         End of Session   Activity Tolerance: Patient  tolerated treatment well Patient left: in bed;with call bell/phone within reach (positioned off L butt cheek and R ankle) Nurse Communication: Mobility status (how to position position optimal for pressure relief)    Functional Assessment Tool Used: clinical judgement Functional Limitation: Mobility: Walking and moving around Mobility: Walking and Moving Around Current Status (K3546): At least 60 percent but less than 80 percent impaired, limited or restricted Mobility: Walking and Moving Around Goal Status [phone]): At least 20 percent but less than 40 percent impaired, limited or restricted    Time: 1142-1227 PT Time Calculation (min) (ACUTE ONLY): 45 min   Charges:   PT Evaluation $Initial PT Evaluation Tier I: 1 Procedure PT Treatments $Therapeutic Activity: 8-22 mins   PT G Codes:   PT G-Codes **NOT FOR INPATIENT CLASS** Functional Assessment Tool Used: clinical judgement Functional Limitation: Mobility: Walking and moving around Mobility: Walking and Moving Around Current Status (X5170): At least 60 percent but less than 80 percent impaired, limited or restricted Mobility: Walking and Moving Around Goal Status [phone]): At least 20 percent but less than 40 percent impaired, limited or restricted    Kingsley Callander 11/01/2014, 1:06  PM   Kittie Plater, PT, DPT Pager #: [phone] Office #: [phone]

## 2014-11-01 NOTE — Progress Notes (Signed)
ANTIBIOTIC CONSULT NOTE - INITIAL  Pharmacy Consult for Vancomycin and Zosyn Indication: R buttock decubitus ulcer  Allergies  Allergen Reactions  . Ivp Dye [Iodinated Diagnostic Agents] Other (See Comments)    "hot and sweaty and almost passed out"  . Aspirin Hives  . Sulfa Antibiotics Hives    Patient Measurements: Height: 5\' 3"  (160 cm) Weight: 170 lb (77.111 kg) IBW/kg (Calculated) : 52.4  Vital Signs: Temp: 99 F (37.2 C) (07/26 2323) Temp Source: Oral (07/26 2323) BP: 150/66 mmHg (07/26 2323) Pulse Rate: 102 (07/26 2323) Intake/Output from previous day:   Intake/Output from this shift:    Labs:  Recent Labs  10/31/14 2111  WBC 15.7*  HGB 8.9*  PLT 434*  CREATININE 0.84   Estimated Creatinine Clearance: 64.8 mL/min (by C-G formula based on Cr of 0.84). No results for input(s): VANCOTROUGH, VANCOPEAK, VANCORANDOM, GENTTROUGH, GENTPEAK, GENTRANDOM, TOBRATROUGH, TOBRAPEAK, TOBRARND, AMIKACINPEAK, AMIKACINTROU, AMIKACIN in the last 72 hours.   Microbiology: Recent Results (from the past 720 hour(s))  Clostridium Difficile by PCR (not at [facility])     Status: None   Collection Time: 10/31/14  4:17 PM  Result Value Ref Range Status   C difficile by pcr NEGATIVE NEGATIVE Final  Culture, blood (routine x 2)     Status: None (Preliminary result)   Collection Time: 11/01/14 12:41 AM  Result Value Ref Range Status   Specimen Description BLOOD LEFT ANTECUBITAL  Final   Special Requests BOTTLES DRAWN AEROBIC AND ANAEROBIC 4CC EA  Final   Culture PENDING  Incomplete   Report Status PENDING  Incomplete    Medical History: Past Medical History  Diagnosis Date  . Hypertension   . Difficult intubation     Fastrach #4 LMA then # 7 ETT used in [facility], cervical laminectomy)  . Hypothyroidism   . Cervical neuropathy     PERIPHERAL NEUROPATHY , HAD CERVICAL FUSION  . PONV (postoperative nausea and vomiting)   . Hypercholesteremia   . Kidney stones 1990's   "twice; passed in hospital on my own" (04/13/2012)  . Arthritis     "all over" (04/13/2012)  . Breast cancer 2011    "right" (04/13/2012)    Medications:  Prescriptions prior to admission  Medication Sig Dispense Refill Last Dose  . acetaminophen (TYLENOL) 500 MG tablet Take 500 mg by mouth every 6 (six) hours as needed for mild pain or moderate pain.   Past Week at Unknown time  . Alpha-D-Galactosidase (BEANO MELTAWAYS PO) Take 1 tablet by mouth daily as needed (stomach pain).   10/30/2014 at Unknown time  . anastrozole (ARIMIDEX) 1 MG tablet Take 1 tablet (1 mg total) by mouth daily. (Patient taking differently: Take 1 mg by mouth at bedtime. ) 30 tablet 12 10/30/2014 at Unknown time  . baclofen (LIORESAL) 10 MG tablet Take 30 mg by mouth 2 (two) times daily.   10/31/2014 at Unknown time  . DULoxetine (CYMBALTA) 60 MG capsule Take 60 mg by mouth daily after supper.    10/30/2014 at Unknown time  . folic acid (FOLVITE) 1 MG tablet Take 1 mg by mouth daily after supper.    10/30/2014 at Unknown time  . furosemide (LASIX) 40 MG tablet Take 40 mg by mouth daily after supper.    10/30/2014 at Unknown time  . gabapentin (NEURONTIN) 800 MG tablet Take 1,600 mg by mouth 2 (two) times daily.   10/31/2014 at Unknown time  . levothyroxine (SYNTHROID, LEVOTHROID) 125 MCG tablet Take 125 mcg by mouth  daily after supper.    10/30/2014 at Unknown time  . metoprolol (LOPRESSOR) 100 MG tablet Take 100 mg by mouth daily after supper.   10/30/2014 at Unknown time  . morphine (MS CONTIN) 60 MG 12 hr tablet Take 60 mg by mouth every 12 (twelve) hours.  0 10/31/2014 at Unknown time  . NIFEdipine (PROCARDIA XL/ADALAT-CC) 30 MG 24 hr tablet Take 30 mg by mouth daily.     10/30/2014 at Unknown time  . oxyCODONE-acetaminophen (PERCOCET) 10-325 MG per tablet Take 1 tablet by mouth 6 (six) times daily.  0 10/31/2014 at Unknown time  . Simethicone (GAS-X EXTRA STRENGTH) 125 MG CAPS Take 125 mg by mouth daily as needed (stomach pain).    Past Week at Unknown time  . methocarbamol (ROBAXIN) 500 MG tablet Take 1 tablet (500 mg total) by mouth every 6 (six) hours as needed. (Patient not taking: Reported on 10/31/2014) 60 tablet 0 Not Taking at Unknown time  . oxyCODONE-acetaminophen (PERCOCET) 10-650 MG per tablet Take 1 tablet by mouth every 6 (six) hours as needed. For pain (Patient not taking: Reported on 10/31/2014) 60 tablet 0 Not Taking at Unknown time   Assessment: 64 y.o. female presents with abd pain and decubitus ulcer on R buttock. To begin vancomycin and Zosyn for wound infection. WBC elevated to 15.7. Estimated CrCl 65 ml/min - although SCr may not be accurate with paraplegia.  Goal of Therapy:  Vanc trough 10-15 mcg/ml  Plan:  Zosyn 3.375gm IV now over 30 min then 3.375gm IV q8h - subsequent doses over 4 hours Vancomycin 750mg  IV q12h Will f/u renal function, pt's clinical condition, and micro data Vanc trough prn  Sherlon Handing, PharmD, BCPS Clinical pharmacist, pager [phone] 11/01/2014,3:01 AM

## 2014-11-01 NOTE — Clinical Social Work Note (Signed)
CSW received consult for possible SNF placement.  CSW to continue to follow patient's progress to determine if she will need SNF or will be going home with home health depending on recommendation from wound care management and therapy.  Jones Broom. Dodge, MSW, [place] 11/01/2014 2:51 PM

## 2014-11-02 ENCOUNTER — Encounter (HOSPITAL_COMMUNITY): Payer: Self-pay | Admitting: General Practice

## 2014-11-02 ENCOUNTER — Other Ambulatory Visit: Payer: Self-pay | Admitting: Plastic Surgery

## 2014-11-02 DIAGNOSIS — L89154 Pressure ulcer of sacral region, stage 4: Secondary | ICD-10-CM

## 2014-11-02 DIAGNOSIS — F329 Major depressive disorder, single episode, unspecified: Secondary | ICD-10-CM

## 2014-11-02 DIAGNOSIS — R109 Unspecified abdominal pain: Secondary | ICD-10-CM

## 2014-11-02 DIAGNOSIS — Z79899 Other long term (current) drug therapy: Secondary | ICD-10-CM

## 2014-11-02 DIAGNOSIS — L89892 Pressure ulcer of other site, stage 2: Secondary | ICD-10-CM

## 2014-11-02 DIAGNOSIS — L97529 Non-pressure chronic ulcer of other part of left foot with unspecified severity: Principal | ICD-10-CM

## 2014-11-02 DIAGNOSIS — Z853 Personal history of malignant neoplasm of breast: Secondary | ICD-10-CM

## 2014-11-02 DIAGNOSIS — L97519 Non-pressure chronic ulcer of other part of right foot with unspecified severity: Secondary | ICD-10-CM

## 2014-11-02 LAB — CBC
HEMATOCRIT: 26.6 % — AB (ref 36.0–46.0)
Hemoglobin: 8.6 g/dL — ABNORMAL LOW (ref 12.0–15.0)
MCH: 30.6 pg (ref 26.0–34.0)
MCHC: 32.3 g/dL (ref 30.0–36.0)
MCV: 94.7 fL (ref 78.0–100.0)
PLATELETS: 376 10*3/uL (ref 150–400)
RBC: 2.81 MIL/uL — ABNORMAL LOW (ref 3.87–5.11)
RDW: 14.1 % (ref 11.5–15.5)
WBC: 10.7 10*3/uL — ABNORMAL HIGH (ref 4.0–10.5)

## 2014-11-02 LAB — BASIC METABOLIC PANEL
Anion gap: 7 (ref 5–15)
BUN: 7 mg/dL (ref 6–20)
CALCIUM: 8.3 mg/dL — AB (ref 8.9–10.3)
CO2: 30 mmol/L (ref 22–32)
Chloride: 103 mmol/L (ref 101–111)
Creatinine, Ser: 0.74 mg/dL (ref 0.44–1.00)
GFR calc Af Amer: >60 mL/min (ref >60)
Glucose, Bld: 95 mg/dL (ref 65–99)
POTASSIUM: 3 mmol/L — AB (ref 3.5–5.1)
Sodium: 140 mmol/L (ref 135–145)

## 2014-11-02 LAB — VITAMIN B12: Vitamin B-12: 413 pg/mL (ref 180–914)

## 2014-11-02 LAB — IRON AND TIBC
Iron: 20 ug/dL — ABNORMAL LOW (ref 28–170)
SATURATION RATIOS: 11 % (ref 10.4–31.8)
TIBC: 185 ug/dL — ABNORMAL LOW (ref 250–450)
UIBC: 165 ug/dL

## 2014-11-02 LAB — SURGICAL PCR SCREEN
MRSA, PCR: POSITIVE — AB
STAPHYLOCOCCUS AUREUS: POSITIVE — AB

## 2014-11-02 LAB — OCCULT BLOOD X 1 CARD TO LAB, STOOL: Fecal Occult Bld: NEGATIVE

## 2014-11-02 MED ORDER — FERROUS SULFATE 325 (65 FE) MG PO TABS
325.0000 mg | ORAL_TABLET | Freq: Every day | ORAL | Status: DC
  Administered 2014-11-02 – 2014-11-04 (×3): 325 mg via ORAL
  Filled 2014-11-02 (×3): qty 1

## 2014-11-02 MED ORDER — SODIUM CHLORIDE 0.9 % IV SOLN: INTRAVENOUS | Status: DC

## 2014-11-02 MED ORDER — POTASSIUM CHLORIDE CRYS ER 20 MEQ PO TBCR
40.0000 meq | EXTENDED_RELEASE_TABLET | Freq: Two times a day (BID) | ORAL | Status: AC
  Administered 2014-11-02 – 2014-11-03 (×3): 40 meq via ORAL
  Filled 2014-11-02 (×3): qty 2

## 2014-11-02 MED ORDER — SODIUM CHLORIDE 0.9 % IV SOLN: INTRAVENOUS | Status: AC

## 2014-11-02 MED ORDER — MUPIROCIN 2 % EX OINT
1.0000 "application " | TOPICAL_OINTMENT | Freq: Two times a day (BID) | CUTANEOUS | Status: AC
  Administered 2014-11-02 – 2014-11-06 (×9): 1 via NASAL
  Filled 2014-11-02: qty 22

## 2014-11-02 MED ORDER — BACLOFEN 20 MG PO TABS
30.0000 mg | ORAL_TABLET | Freq: Two times a day (BID) | ORAL | Status: DC
  Administered 2014-11-02 – 2014-11-04 (×4): 30 mg via ORAL
  Administered 2014-11-04: 10 mg via ORAL
  Administered 2014-11-05 – 2014-11-08 (×7): 30 mg via ORAL
  Filled 2014-11-02 (×24): qty 1

## 2014-11-02 MED ORDER — BOOST / RESOURCE BREEZE PO LIQD
1.0000 | Freq: Three times a day (TID) | ORAL | Status: DC
  Administered 2014-11-02 – 2014-11-08 (×7): 1 via ORAL
  Filled 2014-11-02: qty 1

## 2014-11-02 MED ORDER — VITAMIN C 500 MG PO TABS
500.0000 mg | ORAL_TABLET | Freq: Every day | ORAL | Status: DC
  Administered 2014-11-02 – 2014-11-08 (×7): 500 mg via ORAL
  Filled 2014-11-02 (×7): qty 1

## 2014-11-02 MED ORDER — CHLORHEXIDINE GLUCONATE CLOTH 2 % EX PADS
6.0000 | MEDICATED_PAD | Freq: Every day | CUTANEOUS | Status: AC
  Administered 2014-11-02 – 2014-11-06 (×5): 6 via TOPICAL

## 2014-11-02 MED ORDER — POTASSIUM CHLORIDE 20 MEQ PO PACK
40.0000 meq | PACK | Freq: Two times a day (BID) | ORAL | Status: DC
  Filled 2014-11-02 (×3): qty 2

## 2014-11-02 NOTE — Clinical Documentation Improvement (Signed)
  Please clarify the medical condition related to the below findings.  Hypokalemia Other________ Unable to suspect or determine  10/31/14:  K= 2.9 11/01/14:  K= 3.1, 2.9 11/02/14:  K= 3.0  Thank  You!  Pryor Montes, BSN, RN [place] HIM/Clinical Documentation Specialist [email or web address] [phone]

## 2014-11-02 NOTE — Progress Notes (Signed)
Subjective: No complaints today. Scheduled for surgery tomorrow. She is taking PO. Husband at bedside. Had 3 bowel movements yesterday, one today, formed- diarrhea improving.  Objective: Vital signs in last 24 hours: Filed Vitals:   11/01/14 0957 11/01/14 1424 11/01/14 2029 11/02/14 0443  BP: 147/63 129/64 126/44 121/51  Pulse: 106 107 87 82  Temp: 98.6 F (37 C) 98.5 F (36.9 C) 98.9 F (37.2 C) 98.6 F (37 C)  TempSrc: Oral Oral Oral Oral  Resp:  18 19 19   Height:      Weight:      SpO2: 93% 98% 95% 98%   Weight change:   Intake/Output Summary (Last 24 hours) at 11/02/14 1310 Last data filed at 11/02/14 2353  Gross per 24 hour  Intake    480 ml  Output   2075 ml  Net  -1595 ml   General appearance: alert, cooperative and appears stated age Head: Normocephalic, without obvious abnormality, atraumatic Lungs: clear to auscultation bilaterally Heart: regular rate and rhythm, S1, S2 normal,  Abdomen: soft, non-tender; bowel sounds normal Extremities: extremities normal, atraumatic, no cyanosis or edema  Lab Results: Basic Metabolic Panel:  Recent Labs Lab 10/31/14 2111  11/01/14 0540 11/02/14 1154  NA 141  < > 139 140  K 2.9*  < > 2.9* 3.0*  CL 94*  < > 96* 103  CO2 33*  < > 34* 30  GLUCOSE 101*  < > 87 95  BUN 10  < > 9 7  CREATININE 0.84  < > 0.81 0.74  CALCIUM 8.5*  < > 8.3* 8.3*  MG 2.0  --   --   --   PHOS 3.1  --   --   --   < > = values in this interval not displayed. Liver Function Tests:  Recent Labs Lab 10/31/14 2111  AST 25  ALT 12*  ALKPHOS 54  BILITOT 0.8  PROT 7.1  ALBUMIN 2.0*    Recent Labs Lab 10/31/14 2111  LIPASE 21*   CBC:  Recent Labs Lab 10/31/14 2111 11/01/14 0049 11/01/14 0540 11/02/14 1154  WBC 15.7* 13.4* 12.9* 10.7*  NEUTROABS 13.7* 11.4*  --   --   HGB 8.9* 8.8* 9.0* 8.6*  HCT 27.8* 27.6* 28.0* 26.6*  MCV 94.9 95.8 94.9 94.7  PLT 434* 408* 410* 376   Anemia Panel:  Recent Labs Lab 11/01/14 1231  11/02/14 1154  FERRITIN 1029*  --   TIBC  --  185*  IRON  --  20*   Urinalysis:  Recent Labs Lab 11/01/14 1127  COLORURINE YELLOW  LABSPEC 1.016  PHURINE 8.5*  GLUCOSEU NEGATIVE  HGBUR NEGATIVE  BILIRUBINUR NEGATIVE  KETONESUR NEGATIVE  PROTEINUR NEGATIVE  UROBILINOGEN 1.0  NITRITE NEGATIVE  LEUKOCYTESUR MODERATE*   Micro Results: Recent Results (from the past 240 hour(s))  Clostridium Difficile by PCR (not at [facility])     Status: None   Collection Time: 10/31/14  4:17 PM  Result Value Ref Range Status   C difficile by pcr NEGATIVE NEGATIVE Final  Culture, blood (routine x 2)     Status: None (Preliminary result)   Collection Time: 11/01/14 12:41 AM  Result Value Ref Range Status   Specimen Description BLOOD LEFT ANTECUBITAL  Final   Special Requests BOTTLES DRAWN AEROBIC AND ANAEROBIC 4CC EA  Final   Culture NO GROWTH 1 DAY  Final   Report Status PENDING  Incomplete  Culture, blood (routine x 2)     Status: None (Preliminary  result)   Collection Time: 11/01/14 12:49 AM  Result Value Ref Range Status   Specimen Description BLOOD LEFT ANTECUBITAL  Final   Special Requests BOTTLES DRAWN AEROBIC ONLY 4CC  Final   Culture NO GROWTH 1 DAY  Final   Report Status PENDING  Incomplete  Surgical pcr screen     Status: Abnormal   Collection Time: 11/02/14  4:20 AM  Result Value Ref Range Status   MRSA, PCR POSITIVE (A) NEGATIVE Final    Comment: RESULT CALLED TO, READ BACK BY AND VERIFIED WITH: C HUCKBEE,RN [id number] WILDERK    Staphylococcus aureus POSITIVE (A) NEGATIVE Final    Comment:        The Xpert SA Assay (FDA approved for NASAL specimens in patients over 56 years of age), is one component of a comprehensive surveillance program.  Test performance has been validated by [facility] for patients greater than or equal to 31 year old. It is not intended to diagnose infection nor to guide or monitor treatment. RESULT CALLED TO, READ BACK BY AND VERIFIED WITHPerry Mount [id number] WILDERK    Studies/Results: Ct Abdomen Pelvis W Contrast  11/01/2014   CLINICAL DATA:  Right and left upper quadrant pain. History of paraplegia with sacral decubitus ulcer.  EXAM: CT ABDOMEN AND PELVIS WITH CONTRAST  TECHNIQUE: Multidetector CT imaging of the abdomen and pelvis was performed using the standard protocol following bolus administration of intravenous contrast.  CONTRAST:  78mL OMNIPAQUE IOHEXOL 300 MG/ML SOLN, 36mL OMNIPAQUE IOHEXOL 300 MG/ML SOLN  COMPARISON:  None.  FINDINGS: Lower chest:  The included lung bases are clear.  Hepatobiliary: The liver is prominent in size with decreased density. Subcentimeter 8 mm hypodensity in the right hepatic lobe. Gallbladder is physiologically distended. No calcified gallstones. Mild diffuse prominence of the common bile duct measuring 9 mm in the mid distal portion. No calcified choledocholithiasis.  Pancreas: Mild atrophy without ductal dilatation or surrounding inflammation.  Spleen: Normal in size with scattered splenic granulomas.  Adrenals/Urinary Tract: Asymmetric renal size with right kidney smaller than left and thinning of the right renal parenchyma. Bilateral nonobstructing renal calculi. No hydronephrosis or obstructive uropathy. Simple cyst in the upper left kidney measures 1.4 cm. No adrenal nodule.  Stomach/Bowel: Stomach is decompressed. There are no dilated or thickened small bowel loops. No obstruction, oral contrast is seen throughout the colon. Small volume of colonic stool. The appendix is normal.  Vascular/Lymphatic: No retroperitoneal adenopathy. Abdominal aorta is normal in caliber. Calcified and noncalcified atheromatous plaque about the abdominal aorta and its branches, no aneurysm. Prominent left inguinal lymph nodes.  Reproductive: Uterus surgically absent.  No adnexal mass.  Bladder: Physiologically distended with questionable bladder wall thickening.  Other: Soft tissue air and induration about the  posterior medial left upper thigh, partially included. Scattered foci of air in the left flank soft tissues. Soft tissue edema posterior to the sacrum.  Musculoskeletal: Multilevel degenerative change throughout the lumbar spine. There are no acute or suspicious osseous abnormalities.  IMPRESSION: 1. Soft tissue air and induration about the posterior medial left upper thigh, partially included. This is likely related to decubitus ulcer. Scattered air in the left flank soft tissues. 2. Bilateral nonobstructing renal calculi. Question of bladder wall thickening, correlation with urinalysis recommended to exclude urinary tract infection. Asymmetric renal size with scarring in the right kidney. 3. Prominence of the extrahepatic biliary tree without definable cause. No gallbladder distention. 4. Hepatomegaly and hepatic steatosis. 5. Atherosclerosis.   Electronically  Signed   By: Jeb Levering M.D.   On: 11/01/2014 04:26   Medications: I have reviewed the patient's current medications. Scheduled Meds: . anastrozole  1 mg Oral QHS  . baclofen  30 mg Oral BID  . Chlorhexidine Gluconate Cloth  6 each Topical Q0600  . DULoxetine  60 mg Oral QPC supper  . enoxaparin (LOVENOX) injection  40 mg Subcutaneous Q24H  . feeding supplement  1 Container Oral TID BM  . folic acid  1 mg Oral QPC supper  . gabapentin  1,600 mg Oral BID  . levothyroxine  125 mcg Oral QAC breakfast  . metoprolol  100 mg Oral QPC supper  . morphine  60 mg Oral Q12H  . multivitamin with minerals  1 tablet Oral Daily  . mupirocin ointment  1 application Nasal BID  . NIFEdipine  30 mg Oral Daily  . pantoprazole  40 mg Oral Daily  . piperacillin-tazobactam (ZOSYN)  IV  3.375 g Intravenous 3 times per day  . vancomycin  750 mg Intravenous Q12H  . vitamin C  500 mg Oral Daily  . zinc sulfate  220 mg Oral Daily   Continuous Infusions: . sodium chloride 75 mL/hr at 11/01/14 1020   PRN Meds:.acetaminophen, loperamide,  oxyCODONE-acetaminophen **AND** oxyCODONE, simethicone Assessment/Plan: Principal Problem:   Sacral ulcer Active Problems:   HTN (hypertension)   Breast cancer of lower-inner quadrant of right female breast   Hypothyroidism   Abdominal pain   Paraplegia  Abdominal Pain/Diarrhea: Resolving. Abdomen soft.C. Diff neg. On Antibiotics to cover decub ulcers.  - Loperamide as needed - Simethicone - IVF for another 5 hrs, then SD/c - PO KCL 40 X3 doses, till am.  Decubitus Ulcers: Seen by Plastic surgery. ED notes gluteal ulcer extends to muscle (stage 4). Other ulcers stage 1-2, with one draining pus. This may explain patient's WBC count.  - Wound care recs - Appreciate Plastics recs, for surgery tomorrow. - NPO from midnight - BCx- f/u so far NG - Vanc/Zosyn per pharmacy, cont for now  Anemia: Hgb 8.6, down from 13 in 09/2014. MCV not suggestive of B12 defc.Patient already on Folic acid supplementation. Ferritin elevated, but patient has decub ulcers likely infected. Considering acute drop, possible GI bleed.  - FOBT - CBC in AM - Folate- normal  - Serum Iron- Low, iron saturation- low, but low TIBC- not consistent with fe defc.  - B12- WNL. - Protonix 40mg  daily- consider increasing dose to BID if FOBT positive with dark stools - Start Oral iron suppliments - Colonoscopy outpt  - Consider d/c anticoag if Hgb drops.  HTN: stable - Nifedipine 30 mg - Metoprolol Succ 100 mg   Hypothyroid: stable - Levothyroxine 125 mcg daily  Paraplegia:  - Baclofen 20 mg BID - Gabapentin 1600 mg BID  Depression: - Cymbalta  H/o Breast Cancer: stable. Last mammogram Jan 2015. Followed by Susanne Borders NP ([place]). - Anastrozole 1 mg daily  FEN/GI - Replete K - Bmet am - NPO from midnight  DVT Ppx: Lovenox- last dose today, prior to surgery  Dispo: Disposition is deferred at this time, awaiting improvement of current medical problems.     LOS: 1 day    Bethena Roys, MD 11/02/2014, 1:10 PM

## 2014-11-02 NOTE — Progress Notes (Signed)
  Date: 11/02/2014  Patient name: [name]  Medical record number: [id number]  Date of birth: November 09, 1950   This patient has been seen and the plan of care was discussed with the house staff. Please see their note for complete details. I concur with their findings with the following additions/corrections: Diarrhea overall better. No cause found. Plastics to take [name] to the OR.   Bartholomew Crews, MD 11/02/2014, 3:29 PM

## 2014-11-02 NOTE — Progress Notes (Signed)
Initial Nutrition Assessment  DOCUMENTATION CODES:   Obesity unspecified  INTERVENTION:   -Boost Breeze po TID, each supplement provides 250 kcal and 9 grams of protein -MVI daily  NUTRITION DIAGNOSIS:   Increased nutrient needs related to wound healing as evidenced by estimated needs.  GOAL:   Patient will meet greater than or equal to 90% of their needs  MONITOR:   PO intake, Supplement acceptance, Diet advancement, Labs, I & O's, Skin, Weight trends  REASON FOR ASSESSMENT:   Low Braden, Consult Wound healing  ASSESSMENT:   Ms. Heimann is a 64 yo female with HTN, HLD, hypothyroidism, h/o ER/PR+ breast cancer, chronic pain, and paraplegia, presents with abdominal pain and sacral decubitus ulcer.   Pt admitted for sacral ulcers and abdominal pain.   Spoke with pt at bedside. She reports buttock wounds are chronic, but heel wounds have developed more recently. She confirms home health nurse was assisting with wound care.   Pt reports good appetite at baseline, however, was decreased due to metallic taste in her mouth which she attributes to the chlorox that was being used for wound treatments. Per pt, metallic taste in her mouth is still present, but is gradually improving. PTA, she was consuming 2 meals per day- lunch: meat, chips, and cookie, dinner: meat, starch, and vegetables. Typical beverages are water, soda, and grape juice. Pt reports she has never eaten breakfast out of habit.   Pt denies weight loss. She reports UBW is around 180#.   Pt is very anxious to eat. She reports she consumed a [employer] sandwich without difficulty yesterday. Discussed importance of good nutritional intake, particularly protein, to assist with wound healing. Pt is confident she will eat well when diet is advanced; discussed addition of supplements. Pt reports she does not like milky-type supplements (Ensure and Boost), due to poor taste when used in the past and lactose intolerance. She refused  Prostat supplement, but is amenable to [facility] when importance of supplementation for wound healing was reiterated.   Nutrition-Focused physical exam completed. Findings are no fat depletion, no muscle depletion, and no edema.   Reviewed COWRN note on 11/01/14. Pt with 3 areases of stage II pressure ulcers on sacrum, unstageable ulcers on lt ischium and rt ankle, and DTI on lt heel.   Surgery is also following. Planning OR. MVI, vitamin C, zinc, and air mattress also ordered.   Labs reviewed: K: 2.9.   Diet Order:  Diet NPO time specified  Skin:  Wound (see comment) (DTI lt heel, UN lt ischium and rt ankle, st II sacrum x3)  Last BM:  11/01/14  Height:   Ht Readings from Last 1 Encounters:  10/31/14 5\' 3"  (1.6 m)    Weight:   Wt Readings from Last 1 Encounters:  10/31/14 170 lb (77.111 kg)    Ideal Body Weight:  48.6 kg  BMI:  Body mass index is 30.12 kg/(m^2).  Estimated Nutritional Needs:   Kcal:  2100-2300  Protein:  115-130 grams  Fluid:  2.1-2.3 L  EDUCATION NEEDS:   Education needs addressed  Tarisha Fader A. Jimmye Norman, RD, LDN, CDE Pager: [phone] After hours Pager: [phone]

## 2014-11-03 ENCOUNTER — Inpatient Hospital Stay (HOSPITAL_COMMUNITY): Payer: Medicare Other | Admitting: Certified Registered"

## 2014-11-03 ENCOUNTER — Encounter (HOSPITAL_COMMUNITY): Payer: Self-pay | Admitting: *Deleted

## 2014-11-03 ENCOUNTER — Encounter (HOSPITAL_COMMUNITY)
Admission: EM | Disposition: A | Discharge status: Home Health | Payer: Self-pay | Source: Home / Self Care | Attending: Internal Medicine

## 2014-11-03 DIAGNOSIS — E876 Hypokalemia: Secondary | ICD-10-CM | POA: Diagnosis present

## 2014-11-03 DIAGNOSIS — R140 Abdominal distension (gaseous): Secondary | ICD-10-CM

## 2014-11-03 DIAGNOSIS — D638 Anemia in other chronic diseases classified elsewhere: Secondary | ICD-10-CM

## 2014-11-03 HISTORY — PX: INCISION AND DRAINAGE OF WOUND: SHX1803

## 2014-11-03 HISTORY — PX: I & D EXTREMITY: SHX5045

## 2014-11-03 LAB — BASIC METABOLIC PANEL
Anion gap: 8 (ref 5–15)
BUN: 7 mg/dL (ref 6–20)
CO2: 27 mmol/L (ref 22–32)
Calcium: 8.1 mg/dL — ABNORMAL LOW (ref 8.9–10.3)
Chloride: 105 mmol/L (ref 101–111)
Creatinine, Ser: 0.82 mg/dL (ref 0.44–1.00)
GFR calc Af Amer: >60 mL/min (ref >60)
GFR calc non Af Amer: >60 mL/min (ref >60)
Glucose, Bld: 108 mg/dL — ABNORMAL HIGH (ref 65–99)
Potassium: 3.5 mmol/L (ref 3.5–5.1)
SODIUM: 140 mmol/L (ref 135–145)

## 2014-11-03 LAB — CBC
HCT: 26.5 % — ABNORMAL LOW (ref 36.0–46.0)
Hemoglobin: 8.6 g/dL — ABNORMAL LOW (ref 12.0–15.0)
MCH: 30.7 pg (ref 26.0–34.0)
MCHC: 32.5 g/dL (ref 30.0–36.0)
MCV: 94.6 fL (ref 78.0–100.0)
Platelets: 370 10*3/uL (ref 150–400)
RBC: 2.8 MIL/uL — ABNORMAL LOW (ref 3.87–5.11)
RDW: 14.1 % (ref 11.5–15.5)
WBC: 10.9 10*3/uL — ABNORMAL HIGH (ref 4.0–10.5)

## 2014-11-03 LAB — FOLATE RBC: Hematocrit: 26.9 % — ABNORMAL LOW (ref 34.0–46.6)

## 2014-11-03 LAB — OCCULT BLOOD X 1 CARD TO LAB, STOOL: FECAL OCCULT BLD: NEGATIVE

## 2014-11-03 LAB — VANCOMYCIN, TROUGH: VANCOMYCIN TR: 14 ug/mL (ref 10.0–20.0)

## 2014-11-03 SURGERY — IRRIGATION AND DEBRIDEMENT WOUND
Anesthesia: General | Site: Foot | Laterality: Left

## 2014-11-03 MED ORDER — LIDOCAINE HCL (PF) 4 % IJ SOLN
5.0000 mL | Freq: Once | INTRAMUSCULAR | Status: DC
  Filled 2014-11-03: qty 5

## 2014-11-03 MED ORDER — PROPOFOL 10 MG/ML IV BOLUS
INTRAVENOUS | Status: DC | PRN
  Administered 2014-11-03 (×2): 50 mg via INTRAVENOUS

## 2014-11-03 MED ORDER — ROCURONIUM BROMIDE 50 MG/5ML IV SOLN
INTRAVENOUS | Status: AC
  Filled 2014-11-03: qty 1

## 2014-11-03 MED ORDER — PROPOFOL 10 MG/ML IV BOLUS
INTRAVENOUS | Status: AC
  Filled 2014-11-03: qty 20

## 2014-11-03 MED ORDER — ONDANSETRON HCL 4 MG/2ML IJ SOLN
INTRAMUSCULAR | Status: DC | PRN
  Administered 2014-11-03: 4 mg via INTRAVENOUS

## 2014-11-03 MED ORDER — PHENYLEPHRINE 40 MCG/ML (10ML) SYRINGE FOR IV PUSH (FOR BLOOD PRESSURE SUPPORT)
PREFILLED_SYRINGE | INTRAVENOUS | Status: AC
  Filled 2014-11-03: qty 10

## 2014-11-03 MED ORDER — FENTANYL CITRATE (PF) 100 MCG/2ML IJ SOLN
INTRAMUSCULAR | Status: DC | PRN
  Administered 2014-11-03 (×3): 50 ug via INTRAVENOUS

## 2014-11-03 MED ORDER — MIDAZOLAM HCL 5 MG/5ML IJ SOLN
INTRAMUSCULAR | Status: DC | PRN
  Administered 2014-11-03: 0.5 mg via INTRAVENOUS
  Administered 2014-11-03: .5 mg via INTRAVENOUS

## 2014-11-03 MED ORDER — LIDOCAINE HCL (CARDIAC) 20 MG/ML IV SOLN
INTRAVENOUS | Status: AC
  Filled 2014-11-03: qty 5

## 2014-11-03 MED ORDER — FENTANYL CITRATE (PF) 250 MCG/5ML IJ SOLN
INTRAMUSCULAR | Status: AC
  Filled 2014-11-03: qty 5

## 2014-11-03 MED ORDER — ENOXAPARIN SODIUM 40 MG/0.4ML ~~LOC~~ SOLN
40.0000 mg | SUBCUTANEOUS | Status: DC
  Administered 2014-11-03 – 2014-11-07 (×5): 40 mg via SUBCUTANEOUS
  Filled 2014-11-03 (×5): qty 0.4

## 2014-11-03 MED ORDER — MIDAZOLAM HCL 2 MG/2ML IJ SOLN
INTRAMUSCULAR | Status: AC
  Filled 2014-11-03: qty 2

## 2014-11-03 MED ORDER — DAKINS (1/4 STRENGTH) 0.125 % EX SOLN
Freq: Two times a day (BID) | CUTANEOUS | Status: AC
  Administered 2014-11-04 (×2): 1
  Administered 2014-11-06: 14:00:00
  Filled 2014-11-03 (×2): qty 473

## 2014-11-03 MED ORDER — 0.9 % SODIUM CHLORIDE (POUR BTL) OPTIME
TOPICAL | Status: DC | PRN
  Administered 2014-11-03: 1000 mL

## 2014-11-03 MED ORDER — SUCCINYLCHOLINE CHLORIDE 20 MG/ML IJ SOLN
INTRAMUSCULAR | Status: AC
  Filled 2014-11-03: qty 1

## 2014-11-03 MED ORDER — SODIUM CHLORIDE 0.9 % IV SOLN
Freq: Once | INTRAVENOUS | Status: DC
  Filled 2014-11-03 (×2): qty 1

## 2014-11-03 MED ORDER — LACTATED RINGERS IV SOLN
INTRAVENOUS | Status: DC
  Administered 2014-11-03: 12:00:00 via INTRAVENOUS

## 2014-11-03 MED ORDER — LIDOCAINE HCL (CARDIAC) 20 MG/ML IV SOLN
INTRAVENOUS | Status: DC | PRN
  Administered 2014-11-03: 50 mg via INTRAVENOUS

## 2014-11-03 MED ORDER — PHENYLEPHRINE HCL 10 MG/ML IJ SOLN
INTRAMUSCULAR | Status: DC | PRN
  Administered 2014-11-03 (×6): 40 ug via INTRAVENOUS

## 2014-11-03 MED ORDER — SODIUM CHLORIDE 0.9 % IR SOLN
Status: DC | PRN
  Administered 2014-11-03: 3000 mL

## 2014-11-03 SURGICAL SUPPLY — 51 items
BAG DECANTER FOR FLEXI CONT (MISCELLANEOUS) IMPLANT
BANDAGE ELASTIC 4 VELCRO ST LF (GAUZE/BANDAGES/DRESSINGS) IMPLANT
BLADE SURG 10 STRL SS (BLADE) ×2 IMPLANT
BNDG GAUZE ELAST 4 BULKY (GAUZE/BANDAGES/DRESSINGS) ×6 IMPLANT
CANISTER SUCTION 2500CC (MISCELLANEOUS) ×4 IMPLANT
CHLORAPREP W/TINT 26ML (MISCELLANEOUS) IMPLANT
CONT SPEC STER OR (MISCELLANEOUS) IMPLANT
COVER SURGICAL LIGHT HANDLE (MISCELLANEOUS) ×4 IMPLANT
DRAPE EXTREMITY T 121X128X90 (DRAPE) IMPLANT
DRAPE IMP U-DRAPE 54X76 (DRAPES) ×4 IMPLANT
DRAPE INCISE IOBAN 66X45 STRL (DRAPES) IMPLANT
DRAPE LAPAROTOMY 100X72 PEDS (DRAPES) ×2 IMPLANT
DRAPE ORTHO SPLIT 77X108 STRL (DRAPES)
DRAPE PED LAPAROTOMY (DRAPES) ×2 IMPLANT
DRAPE PROXIMA HALF (DRAPES) IMPLANT
DRAPE SURG ORHT 6 SPLT 77X108 (DRAPES) IMPLANT
DRSG ADAPTIC 3X8 NADH LF (GAUZE/BANDAGES/DRESSINGS) IMPLANT
DRSG MEPILEX BORDER 4X4 (GAUZE/BANDAGES/DRESSINGS) ×2 IMPLANT
DRSG PAD ABDOMINAL 8X10 ST (GAUZE/BANDAGES/DRESSINGS) ×6 IMPLANT
DRSG VAC ATS LRG SENSATRAC (GAUZE/BANDAGES/DRESSINGS) IMPLANT
DRSG VAC ATS MED SENSATRAC (GAUZE/BANDAGES/DRESSINGS) IMPLANT
DRSG VAC ATS SM SENSATRAC (GAUZE/BANDAGES/DRESSINGS) IMPLANT
ELECT CAUTERY BLADE 6.4 (BLADE) ×4 IMPLANT
ELECT REM PT RETURN 9FT ADLT (ELECTROSURGICAL) ×4
ELECTRODE REM PT RTRN 9FT ADLT (ELECTROSURGICAL) ×2 IMPLANT
GAUZE SPONGE 4X4 12PLY STRL (GAUZE/BANDAGES/DRESSINGS) ×2 IMPLANT
GLOVE BIO SURGEON STRL SZ 6 (GLOVE) ×4 IMPLANT
GLOVE BIO SURGEON STRL SZ7.5 (GLOVE) ×2 IMPLANT
GLOVE BIOGEL PI IND STRL 7.5 (GLOVE) IMPLANT
GLOVE BIOGEL PI INDICATOR 7.5 (GLOVE) ×2
GLOVE SURG SS PI 6.0 STRL IVOR (GLOVE) ×4 IMPLANT
GOWN STRL REUS W/ TWL LRG LVL3 (GOWN DISPOSABLE) ×4 IMPLANT
GOWN STRL REUS W/TWL LRG LVL3 (GOWN DISPOSABLE) ×12
HANDPIECE INTERPULSE COAX TIP (DISPOSABLE) ×4
KIT BASIN OR (CUSTOM PROCEDURE TRAY) ×4 IMPLANT
KIT ROOM TURNOVER OR (KITS) ×4 IMPLANT
NS IRRIG 1000ML POUR BTL (IV SOLUTION) ×4 IMPLANT
PACK GENERAL/GYN (CUSTOM PROCEDURE TRAY) ×4 IMPLANT
PACK UNIVERSAL I (CUSTOM PROCEDURE TRAY) ×4 IMPLANT
PAD ABD 8X10 STRL (GAUZE/BANDAGES/DRESSINGS) ×2 IMPLANT
PAD ARMBOARD 7.5X6 YLW CONV (MISCELLANEOUS) ×8 IMPLANT
SET HNDPC FAN SPRY TIP SCT (DISPOSABLE) IMPLANT
SURGILUBE 2OZ TUBE FLIPTOP (MISCELLANEOUS) IMPLANT
SUT VIC AB 5-0 PS2 18 (SUTURE) IMPLANT
SWAB COLLECTION DEVICE MRSA (MISCELLANEOUS) IMPLANT
TAPE CLOTH SURG 6X10 WHT LF (GAUZE/BANDAGES/DRESSINGS) ×2 IMPLANT
TOWEL OR 17X24 6PK STRL BLUE (TOWEL DISPOSABLE) ×4 IMPLANT
TOWEL OR 17X26 10 PK STRL BLUE (TOWEL DISPOSABLE) ×4 IMPLANT
TUBE ANAEROBIC SPECIMEN COL (MISCELLANEOUS) IMPLANT
UNDERPAD 30X30 INCONTINENT (UNDERPADS AND DIAPERS) ×4 IMPLANT
WATER STERILE IRR 1000ML POUR (IV SOLUTION) IMPLANT

## 2014-11-03 NOTE — Anesthesia Procedure Notes (Signed)
Procedure Name: Intubation Date/Time: 11/03/2014 11:57 AM Performed by: Lavell Luster Pre-anesthesia Checklist: Patient identified, Emergency Drugs available, Suction available, Patient being monitored and Timeout performed Patient Re-evaluated:Patient Re-evaluated prior to inductionOxygen Delivery Method: Circle system utilized Preoxygenation: Pre-oxygenation with 100% oxygen Intubation Type: IV induction Ventilation: Mask ventilation without difficulty Laryngoscope Size: Mac and 3 Grade View: Grade I Tube type: Oral Tube size: 6.5 mm Number of attempts: 1 Airway Equipment and Method: Video-laryngoscopy and LTA kit utilized Placement Confirmation: ETT inserted through vocal cords under direct vision,  positive ETCO2 and breath sounds checked- equal and bilateral Secured at: 23 cm Dental Injury: Teeth and Oropharynx as per pre-operative assessment  Difficulty Due To: Difficulty was anticipated, Difficult Airway-  due to neck instability and Difficult Airway- due to dentition Future Recommendations: Recommend- induction with short-acting agent, and alternative techniques readily available Comments: Easy videolaryngoscope intubation.  Pt with known H/O difficult intubation at [facility].  All emergency equipment and alternatives available.  4% Lidocaine nebulized prior to intubation.

## 2014-11-03 NOTE — Progress Notes (Signed)
Patient arrived back to floor from PACU. Report received from [place], [place]. Family at bedside.

## 2014-11-03 NOTE — Progress Notes (Signed)
OT Cancellation Note  Patient Details Name: [name] MRN: [id number] DOB: [birth year]   Cancelled Treatment:    Reason Eval/Treat Not Completed: Patient at procedure or test/ unavailable. Pt scheduled for surgery today, OT to follow-up after.  Forest Gleason 11/03/2014, 7:08 AM

## 2014-11-03 NOTE — Care Management Note (Signed)
Case Management Note  Patient Details  Name: [name] MRN: [id number] Date of Birth: 05-25-1950  Subjective/Objective:                    Action/Plan: Spoke with patient's daughter , who states patient had [facility] prior to admission , however, they would like a different home health agency post discharge . Provided daughter with a list of home health agencies for [facility] .   Will need home health orders for wound care .   Thanks   Magdalen Spatz RN BSN   Expected Discharge Date:                  Expected Discharge Plan:  [place]  In-House Referral:     Discharge planning Services  CM Consult  Post Acute Care Choice:    Choice offered to:     DME Arranged:    DME Agency:     HH Arranged:    [place] Agency:     Status of Service:  In process, will continue to follow  Medicare Important Message Given:    Date Medicare IM Given:    Medicare IM give by:    Date Additional Medicare IM Given:    Additional Medicare Important Message give by:     If discussed at [place] of Stay Meetings, dates discussed:    Additional Comments:  [name] Favre, RN 11/03/2014, 3:40 PM

## 2014-11-03 NOTE — Progress Notes (Signed)
  Date: 11/03/2014  Patient name: [name]  Medical record number: [id number]  Date of birth: [birth year]   This patient has been seen and the plan of care was discussed with the house staff. Please see their note for complete details. I concur with their findings with the following additions/corrections: Ms Swingler continues to have ABD fullness which she describes as gas. Runny BM today.   1. Decub ulcers - wound care. Debridement today per plastics. Cont ABX, narrow as soon as possible based on cxs from OR.  2. Bloating / gas - CT did not show cause. Will cont to treat sxs. Does not fit IBS criteria as this sxs was not bad until the D started prior to admit.  3. Anemia - 2/2 chronic dz. Ferritin R/O iron def and guaiac R/O GI loss.   Bartholomew Crews, MD 11/03/2014, 12:25 PM

## 2014-11-03 NOTE — Progress Notes (Signed)
Subjective: No complaints today. Husband and daughter at bedside. OR today with Plastics. She was tolerating PO well but complains of being hungry since NPO for surgery. Had 1 large loose stool yesterday and 1 loose stool O/N. Continues to complain of gas-like pain in epigastric region which waxes and wanes. Diarrhea improved from admission, but still concerned for sacral wound care in the setting of loose stool and incontinence.  Objective: Vital signs in last 24 hours: Filed Vitals:   11/02/14 0443 11/02/14 1419 11/02/14 2226 11/03/14 0610  BP: 121/51 147/63 127/55 136/59  Pulse: 82 76 83 79  Temp: 98.6 F (37 C) 97.8 F (36.6 C) 98.6 F (37 C) 99.1 F (37.3 C)  TempSrc: Oral Oral Oral Oral  Resp: 19  18 18   Height:      Weight:      SpO2: 98% 100% 98% 99%   Weight change:   Intake/Output Summary (Last 24 hours) at 11/03/14 1093 Last data filed at 11/03/14 2355  Gross per 24 hour  Intake      0 ml  Output   2325 ml  Net  -2325 ml   General appearance: alert, cooperative and appears stated age Head: Normocephalic, without obvious abnormality, atraumatic Lungs: clear to auscultation bilaterally Heart: regular rate and rhythm, S1, S2 normal,  Abdomen: tense at baseline, non-tender throughout except mild TTP epigastric region; bowel sounds normal Extremities: extremities normal, atraumatic, no cyanosis or edema Sacral Ulcer, Ischial Ulcer, R Ankle Ulcer, L Ankle Ulcer unchanged  Lab Results: Basic Metabolic Panel:  Recent Labs Lab 10/31/14 2111  11/02/14 1154 11/03/14 0318  NA 141  < > 140 140  K 2.9*  < > 3.0* 3.5  CL 94*  < > 103 105  CO2 33*  < > 30 27  GLUCOSE 101*  < > 95 108*  BUN 10  < > 7 7  CREATININE 0.84  < > 0.74 0.82  CALCIUM 8.5*  < > 8.3* 8.1*  MG 2.0  --   --   --   PHOS 3.1  --   --   --   < > = values in this interval not displayed. Liver Function Tests:  Recent Labs Lab 10/31/14 2111  AST 25  ALT 12*  ALKPHOS 54  BILITOT 0.8  PROT  7.1  ALBUMIN 2.0*    Recent Labs Lab 10/31/14 2111  LIPASE 21*   CBC:  Recent Labs Lab 10/31/14 2111 11/01/14 0049  11/02/14 1154 11/03/14 0318  WBC 15.7* 13.4*  < > 10.7* 10.9*  NEUTROABS 13.7* 11.4*  --   --   --   HGB 8.9* 8.8*  < > 8.6* 8.6*  HCT 27.8* 27.6*  < > 26.6* 26.5*  MCV 94.9 95.8  < > 94.7 94.6  PLT 434* 408*  < > 376 370  < > = values in this interval not displayed. Anemia Panel:  Recent Labs Lab 11/01/14 1231 11/02/14 1153 11/02/14 1154  VITAMINB12  --  413  --   FERRITIN 1029*  --   --   TIBC  --   --  185*  IRON  --   --  20*   Urinalysis:  Recent Labs Lab 11/01/14 1127  COLORURINE YELLOW  LABSPEC 1.016  PHURINE 8.5*  GLUCOSEU NEGATIVE  HGBUR NEGATIVE  BILIRUBINUR NEGATIVE  KETONESUR NEGATIVE  PROTEINUR NEGATIVE  UROBILINOGEN 1.0  NITRITE NEGATIVE  LEUKOCYTESUR MODERATE*   Micro Results: Recent Results (from the past 240 hour(s))  Clostridium  Difficile by PCR (not at [facility])     Status: None   Collection Time: 10/31/14  4:17 PM  Result Value Ref Range Status   C difficile by pcr NEGATIVE NEGATIVE Final  Culture, blood (routine x 2)     Status: None (Preliminary result)   Collection Time: 11/01/14 12:41 AM  Result Value Ref Range Status   Specimen Description BLOOD LEFT ANTECUBITAL  Final   Special Requests BOTTLES DRAWN AEROBIC AND ANAEROBIC 4CC EA  Final   Culture NO GROWTH 1 DAY  Final   Report Status PENDING  Incomplete  Culture, blood (routine x 2)     Status: None (Preliminary result)   Collection Time: 11/01/14 12:49 AM  Result Value Ref Range Status   Specimen Description BLOOD LEFT ANTECUBITAL  Final   Special Requests BOTTLES DRAWN AEROBIC ONLY 4CC  Final   Culture NO GROWTH 1 DAY  Final   Report Status PENDING  Incomplete  Surgical pcr screen     Status: Abnormal   Collection Time: 11/02/14  4:20 AM  Result Value Ref Range Status   MRSA, PCR POSITIVE (A) NEGATIVE Final    Comment: RESULT CALLED TO, READ BACK BY AND  VERIFIED WITH: C HUCKBEE,RN [id number] WILDERK    Staphylococcus aureus POSITIVE (A) NEGATIVE Final    Comment:        The Xpert SA Assay (FDA approved for NASAL specimens in patients over 13 years of age), is one component of a comprehensive surveillance program.  Test performance has been validated by [facility] for patients greater than or equal to 56 year old. It is not intended to diagnose infection nor to guide or monitor treatment. RESULT CALLED TO, READ BACK BY AND VERIFIED WITH: C HUCKBEE,RN [id number] [place]    Studies/Results: No results found. Medications: I have reviewed the patient's current medications. Scheduled Meds: . anastrozole  1 mg Oral QHS  . baclofen  30 mg Oral BID  . Chlorhexidine Gluconate Cloth  6 each Topical Q0600  . DULoxetine  60 mg Oral QPC supper  . feeding supplement  1 Container Oral TID BM  . ferrous sulfate  325 mg Oral Daily  . folic acid  1 mg Oral QPC supper  . gabapentin  1,600 mg Oral BID  . levothyroxine  125 mcg Oral QAC breakfast  . metoprolol  100 mg Oral QPC supper  . morphine  60 mg Oral Q12H  . multivitamin with minerals  1 tablet Oral Daily  . mupirocin ointment  1 application Nasal BID  . NIFEdipine  30 mg Oral Daily  . pantoprazole  40 mg Oral Daily  . piperacillin-tazobactam (ZOSYN)  IV  3.375 g Intravenous 3 times per day  . potassium chloride  40 mEq Oral BID  . vancomycin  750 mg Intravenous Q12H  . vitamin C  500 mg Oral Daily  . zinc sulfate  220 mg Oral Daily   Continuous Infusions:   PRN Meds:.acetaminophen, loperamide, oxyCODONE-acetaminophen **AND** oxyCODONE, simethicone Assessment/Plan: Principal Problem:   Sacral ulcer Active Problems:   HTN (hypertension)   Breast cancer of lower-inner quadrant of right female breast   Hypothyroidism   Abdominal pain   Paraplegia  Abdominal Pain/Diarrhea: Resolving, still one large loose stool yesterday PM and 1 O/N. Afeb. Abdomen chronically tonic 2/2  spinal cord injury, Non-tender.C. Diff neg. On Antibiotics to cover decub ulcers, may be contributing to loose stool.  - Loperamide as needed - Simethicone - Tolerating P.O. hydration  Decubitus Ulcers: OR for debridement today with Plastic surgery. ED notes gluteal ulcer extends to muscle (stage 4). Other ulcers stage 1-2, with one draining pus. Likely cause for WBC count.  - Appreciate wound care recs - Appreciate Plastics recs, post-op care - Consider rectal tube for wound care in the setting of incontinence and loose stool - ADAT post-op - BCx - NGx2d - Vanc/Zosyn per pharmacy, cont for now, will narrow if needed following wound culture  Anemia: Stable. Hgb 8.6, down from 13 in 09/2014. MCV WNL, not suggestive of B12 defc.Patient already on Folic acid supplementation. Ferritin elevated in the setting of decub ulcers likely infected. FOBT neg without evidence of melanotic/bloody stool. Considering recent acute drop, possible GI bleed.  - CBC in AM - FOBT neg x1. Stool  - Folate/B12 WNL  - Serum Iron- Low, iron sat- Low, but low TIBC- not consistent with fe defc.  - Protonix 40mg  daily- consider increasing dose to BID if FOBT positive with dark stools - Continue PO FeSO4 - Colonoscopy outpt  - Consider d/c anticoag if Hgb drops.  HTN: stable - Nifedipine 30 mg - Metoprolol Succ 100 mg   Hypothyroid: stable - Levothyroxine 125 mcg daily  Paraplegia:  - Baclofen 20 mg BID - Gabapentin 1600 mg BID  Depression: - Cymbalta  H/o Breast Cancer: stable. Last mammogram Jan 2015. Followed by Susanne Borders NP ([place]). - Anastrozole 1 mg daily  FEN/GI - Bmet am - ADAT post-op  DVT Ppx: restart Lovenox post-op  Dispo: Disposition is deferred at this time, awaiting improvement of current medical problems.     LOS: 2 days   Effie Berkshire, Med Student 11/03/2014, 6:42 AM

## 2014-11-03 NOTE — Op Note (Signed)
Operative Note   DATE OF OPERATION: 7.29.2016  LOCATION: [facility]- inpatient  SURGICAL DIVISION: Plastic Surgery  PREOPERATIVE DIAGNOSES:  1. Stage 2 pressure sore sacrum 2. Pressure sore left ischium, unstageable 3. Pressure sore left heel ,unstageable 4, Pressure sore right lateral ankle, unstageable  POSTOPERATIVE DIAGNOSES:   1. Stage 2 pressure sore sacrum 2. Pressure sore left ischium, stage 4 3. Pressure sore left heel ,stage 3 4.Pressure sore right lateral ankle, stage 4  PROCEDURE:  1. Excisional debridement skin, subcutaneous, muscle left ischium 35 cm2 2. Excisional debridement skin, subcutaneous tissue left heel 27 cm2 3. Excisional debridement right ankle skin, subcutaneous, bone 30 cm2  SURGEON: Irene Limbo MD MBA  ASSISTANT: none  ANESTHESIA:  General.   EBL: 50 ml  COMPLICATIONS: None.   INDICATIONS FOR PROCEDURE:  The patient, [name], is a 64 y.o. female born on Jan 02, 1951, is here for debridement multiple pressure sores incurred at home in setting paraplegia from thoracic stenosis and severe myelopathy.   FINDINGS:  1. Large necrotic wound ischium, bone itself not exposed. Post debridement 5 x 7 x 7 cm  2. Left heel wound with large area of epidermolysis and pressure injury to underlying tissue, post debridement 4.5 x 6 x1 cm 3.  Right lateral ankle with exposure fibula at lateral malleolus following debridement. Bone sent for culture, hard in nature. Post debridement 8.2 x 4.5 x 1 cm  DESCRIPTION OF PROCEDURE:  The patient's operative site was marked with the patient in the preoperative area. The patient was taken to the operating room. Following induction of general anesthesia, patient placed in prone position. Patient on IV antibiotics and no additional antibiotics given. The patient's operative site was prepped and draped in a sterile fashion. A time out was performed and all information was confirmed to be correct. Sacral ulcer stage 2 and  clean, no debridement performed. Ischial ulcer was noted to be granulated over 7 to 9 o' clock of wound. Remainder was necrotic material. Sharp excision completed with knife and cautery until bleeding viable tissue encountered. Skin subcutaneous tissue gluteus muscle all necrotic and excised. Base of wound following debridement down to ischium, but soft tissue remained over bone.   I then directed attention to heel ulcer. As noted in findings, patient presented with small skin opening within areas of epidermolysis. All epidermolysed skin excised. Present beneath this was dark colored skin indicating pressure injury. Tangential debridement with knife completed until bleeding encountered. Full thickness of skin resected and vessels within subcutaneous fat noted to be thrombosed. Additional subcutaneous fat excised.   Over right lateral leg, eschar in place that was lifting. This was excised with knife. Lateral malleolus bone exposed. Remainder necrotic fat excised and exposed bone ronguered. All wounds then irrigated with pulsatile lavage. Hemostasis obtained with cautery. A new rongeur was then used to sample bone and this was sent as culture. Bone briskly bled and was hard in nature.   Moist to dry dressing placed in all wounds and covered with dry dressing. Patient was returned to supine position.The patient was allowed to wake from anesthesia, extubated and taken to the recovery room in satisfactory condition.   SPECIMENS: right fibula bone for culture  DRAINS: none  Irene Limbo, MD [facility] [phone]

## 2014-11-03 NOTE — Anesthesia Postprocedure Evaluation (Signed)
  Anesthesia Post-op Note  Patient: [name]  Procedure(s) Performed: Procedure(s): IRRIGATION AND DEBRIDEMENT SACRAL ULCER (Left) IRRIGATION AND DEBRIDEMENT BILATERAL HEEL (Bilateral)  Patient Location: PACU  Anesthesia Type:General  Level of Consciousness: awake  Airway and Oxygen Therapy: Patient Spontanous Breathing  Post-op Pain: none  Post-op Assessment: Post-op Vital signs reviewed, Patient's Cardiovascular Status Stable, Respiratory Function Stable, Patent Airway, No signs of Nausea or vomiting and Pain level controlled              Post-op Vital Signs: Reviewed and stable  Last Vitals:  Filed Vitals:   11/03/14 1450  BP: 149/60  Pulse: 65  Temp: 36.4 C  Resp:     Complications: No apparent anesthesia complications

## 2014-11-03 NOTE — Anesthesia Preprocedure Evaluation (Addendum)
Anesthesia Evaluation  Patient identified by MRN, date of birth, ID band Patient awake    Reviewed: Allergy & Precautions, NPO status , Patient's Chart, lab work & pertinent test results, reviewed documented beta blocker date and time   History of Anesthesia Complications (+) PONV, DIFFICULT AIRWAY and history of anesthetic complications  Airway Mallampati: IV  TM Distance: >3 FB Neck ROM: Limited    Dental  (+) Teeth Intact, Dental Advisory Given   Pulmonary Current Smoker,  breath sounds clear to auscultation        Cardiovascular hypertension, Pt. on medications Rhythm:Regular     Neuro/Psych PSYCHIATRIC DISORDERS Anxiety Depression  Neuromuscular disease    GI/Hepatic negative GI ROS, Neg liver ROS,   Endo/Other  Hypothyroidism   Renal/GU Renal InsufficiencyRenal disease     Musculoskeletal  (+) Arthritis -,   Abdominal (+)  Abdomen: soft. Bowel sounds: normal.  Peds  Hematology  (+) anemia ,   Anesthesia Other Findings   Reproductive/Obstetrics                          Anesthesia Physical Anesthesia Plan  ASA: III  Anesthesia Plan: General   Post-op Pain Management:    Induction: Intravenous and Inhalational  Airway Management Planned: Video Laryngoscope Planned, Fiberoptic Intubation Planned and Oral ETT  Additional Equipment: None  Intra-op Plan:   Post-operative Plan: Extubation in OR  Informed Consent: I have reviewed the patients History and Physical, chart, labs and discussed the procedure including the risks, benefits and alternatives for the proposed anesthesia with the patient or authorized representative who has indicated his/her understanding and acceptance.   Dental advisory given  Plan Discussed with: Surgeon  Anesthesia Plan Comments:         Anesthesia Quick Evaluation

## 2014-11-03 NOTE — Progress Notes (Signed)
Subjective: Complaints of fullness in her abdomen. Last bowel movement last night, was loose. She has no nausea, and fullness is not relived by bowel movements. She also thinks it is gas.   Objective: Vital signs in last 24 hours: Filed Vitals:   11/02/14 1419 11/02/14 2226 11/03/14 0610 11/03/14 1000  BP: 147/63 127/55 136/59 130/50  Pulse: 76 83 79 80  Temp: 97.8 F (36.6 C) 98.6 F (37 C) 99.1 F (37.3 C) 98 F (36.7 C)  TempSrc: Oral Oral Oral Oral  Resp:  18 18 18   Height:      Weight:      SpO2: 100% 98% 99% 100%   Weight change:   Intake/Output Summary (Last 24 hours) at 11/03/14 1057 Last data filed at 11/03/14 1052  Gross per 24 hour  Intake      0 ml  Output   2200 ml  Net  -2200 ml   General appearance: alert, cooperative and appears stated age, lying in bed, family present. Head: Normocephalic, without obvious abnormality, atraumatic Lungs: Anterior auscultation, bilaterally Heart: regular rate and rhythm, S1, S2 normal, no murmur appreciated Abdomen: soft, non-tender; bowel sounds present Extremities: extremities normal, atraumatic, no cyanosis or edema, 2+ pulses bilat  Lab Results: Basic Metabolic Panel:  Recent Labs Lab 10/31/14 2111  11/02/14 1154 11/03/14 0318  NA 141  < > 140 140  K 2.9*  < > 3.0* 3.5  CL 94*  < > 103 105  CO2 33*  < > 30 27  GLUCOSE 101*  < > 95 108*  BUN 10  < > 7 7  CREATININE 0.84  < > 0.74 0.82  CALCIUM 8.5*  < > 8.3* 8.1*  MG 2.0  --   --   --   PHOS 3.1  --   --   --   < > = values in this interval not displayed. Liver Function Tests:  Recent Labs Lab 10/31/14 2111  AST 25  ALT 12*  ALKPHOS 54  BILITOT 0.8  PROT 7.1  ALBUMIN 2.0*    Recent Labs Lab 10/31/14 2111  LIPASE 21*   CBC:  Recent Labs Lab 10/31/14 2111 11/01/14 0049  11/02/14 1154 11/03/14 0318  WBC 15.7* 13.4*  < > 10.7* 10.9*  NEUTROABS 13.7* 11.4*  --   --   --   HGB 8.9* 8.8*  < > 8.6* 8.6*  HCT 27.8* 27.6*  < > 26.6* 26.5*    MCV 94.9 95.8  < > 94.7 94.6  PLT 434* 408*  < > 376 370  < > = values in this interval not displayed. Anemia Panel:  Recent Labs Lab 11/01/14 1231 11/02/14 1153 11/02/14 1154  VITAMINB12  --  413  --   FERRITIN 1029*  --   --   TIBC  --   --  185*  IRON  --   --  20*   Urinalysis:  Recent Labs Lab 11/01/14 1127  COLORURINE YELLOW  LABSPEC 1.016  PHURINE 8.5*  GLUCOSEU NEGATIVE  HGBUR NEGATIVE  BILIRUBINUR NEGATIVE  KETONESUR NEGATIVE  PROTEINUR NEGATIVE  UROBILINOGEN 1.0  NITRITE NEGATIVE  LEUKOCYTESUR MODERATE*    Medications: I have reviewed the patient's current medications. Scheduled Meds: . anastrozole  1 mg Oral QHS  . Bacitracin 50,000 units, gentamicin 80mg  & cefazolin 1 g in normal saline (106ml) mastectomy irrigation   Irrigation Once  . baclofen  30 mg Oral BID  . Chlorhexidine Gluconate Cloth  6 each Topical Q0600  .  DULoxetine  60 mg Oral QPC supper  . feeding supplement  1 Container Oral TID BM  . ferrous sulfate  325 mg Oral Daily  . folic acid  1 mg Oral QPC supper  . gabapentin  1,600 mg Oral BID  . levothyroxine  125 mcg Oral QAC breakfast  . metoprolol  100 mg Oral QPC supper  . morphine  60 mg Oral Q12H  . multivitamin with minerals  1 tablet Oral Daily  . mupirocin ointment  1 application Nasal BID  . NIFEdipine  30 mg Oral Daily  . pantoprazole  40 mg Oral Daily  . piperacillin-tazobactam (ZOSYN)  IV  3.375 g Intravenous 3 times per day  . potassium chloride  40 mEq Oral BID  . vancomycin  750 mg Intravenous Q12H  . vitamin C  500 mg Oral Daily  . zinc sulfate  220 mg Oral Daily   Continuous Infusions:   PRN Meds:.acetaminophen, loperamide, oxyCODONE-acetaminophen **AND** oxyCODONE, simethicone Assessment/Plan:  Abdominal Pain/Diarrhea: Improving. Abdomen soft.C. Diff neg. On Antibiotics to cover decub ulcers.  - Loperamide as needed - Simethicone - IVF for another 5 hrs, then D/c  Decubitus Ulcers: Seen by Plastic surgery.  ED notes gluteal ulcer extends to muscle (stage 4). Other ulcers stage 1-2, with one draining pus. This may explain patient's WBC count.  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 Ferritin elevated, but patient has decub ulcers likely infected. Considering acute drop, possible GI bleed.  - FOBT - CBC in AM - Folate- normal  - Serum Iron- Low, iron saturation- low, both supportive of Iron defc, but low TIBC- which should be high with fe defc.  - B12- WNL. - Protonix 40mg  daily- consider increasing dose to BID if FOBT positive with dark stools - Start Oral iron suppliments - Colonoscopy outpt  - Consider d/c anticoag if Hgb drops.  HTN: stable - Nifedipine 30 mg - Metoprolol Succ 100 mg   Hypokalemia- Improved- K- 3.5 today. Likley due to diarrhea.  Hypothyroid: stable - Levothyroxine 125 mcg daily  Paraplegia:  - Baclofen 20 mg BID - Gabapentin 1600 mg BID  Depression: - Cymbalta  H/o Breast Cancer: stable. Last mammogram Jan 2015. Followed by Susanne Borders NP ([place]). - Anastrozole 1 mg daily  FEN/GI - Replete K - Bmet am - NPO from midnight  DVT Ppx: Lovenox- last dose today, prior to surgery  Dispo: Disposition is deferred at this time, awaiting improvement of current medical problems.     LOS: 2 days   Bethena Roys, MD 11/03/2014, 10:57 AM

## 2014-11-03 NOTE — Brief Op Note (Signed)
10/31/2014 - 11/03/2014  1:06 PM  PATIENT:  [name]  64 y.o. female  PRE-OPERATIVE DIAGNOSIS:  Pressure ulcers bilateral lower extremities and buttock.  POST-OPERATIVE DIAGNOSIS:  same  PROCEDURE:  Excisional Debridement buttock left (ischium), right leg and ankle, left heel  SURGEON:  Surgeon(s) and Role:    * Irene Limbo, MD - Primary  PHYSICIAN ASSISTANT:   ASSISTANTS: none   ANESTHESIA:   general  EBL:  Total I/O In: -  Out: 400 [Urine:400]  BLOOD ADMINISTERED:none  DRAINS: none   LOCAL MEDICATIONS USED:  NONE  SPECIMEN:  Source of Specimen:  right fibula  DISPOSITION OF SPECIMEN:  lab  COUNTS:  YES  TOURNIQUET:  * No tourniquets in log *  DICTATION: .Note written in EPIC  PLAN OF CARE: Admit to inpatient   PATIENT DISPOSITION:  PACU - hemodynamically stable.   Delay start of Pharmacological VTE agent (>24hrs) due to surgical blood loss or risk of bleeding: no

## 2014-11-03 NOTE — Progress Notes (Signed)
Report given to angel britt rn as caregiver

## 2014-11-03 NOTE — Transfer of Care (Signed)
Immediate Anesthesia Transfer of Care Note  Patient: [name]  Procedure(s) Performed: Procedure(s): IRRIGATION AND DEBRIDEMENT SACRAL ULCER (Left) IRRIGATION AND DEBRIDEMENT BILATERAL HEEL (Bilateral)  Patient Location: PACU  Anesthesia Type:General  Level of Consciousness: awake, alert  and oriented  Airway & Oxygen Therapy: Patient connected to face mask oxygen  Post-op Assessment: Report given to RN  Post vital signs: stable  Last Vitals:  Filed Vitals:   11/03/14 1000  BP: 130/50  Pulse: 80  Temp: 36.7 C  Resp: 18    Complications: No apparent anesthesia complications

## 2014-11-03 NOTE — Interval H&P Note (Signed)
History and Physical Interval Note:  11/03/2014 11:12 AM  [name]  has presented today for surgery, with the diagnosis of Infectiion of Sacral and Bilateral Feet  The various methods of treatment have been discussed with the patient and family. After consideration of risks, benefits and other options for treatment, the patient has consented to  Debridement buttock ulcer and bilateral lower extremity ulcers as a surgical intervention .  The patient's history has been reviewed, patient examined, no change in status, stable for surgery.  I have reviewed the patient's chart and labs.  Questions were answered to the patient's satisfaction.     [name]

## 2014-11-03 NOTE — Progress Notes (Signed)
  Please see Operative note for details.  Patient with large ischial wound down to bone but not exposed, right ankle with bone exposed, and left heel which is to subcutaneous tissue but this is high risk of progressing to calcaneal bone.  There was a large amount of necrotic material. This has been excised. Would only recommend treatment with long term antibiotics if the bone culture was positive.   All wounds could benefit from [facility] but logistically one machine could not be applied to all. The ischial wound would be difficult to cover as adjacent to anus.   I have written for Dakin dressing changes while here in hospital.   For discharge, recommend: 1 Sacrum: foam dressing, change q week. 2. Left Ischium: Can start silver alginate to all wounds every other day, cover with absorptive dressing (eg Drawtek) 3. Left heel: silver alginate every other day, cover with Kerlix and heel foam 4. Right lateral leg: silver alginate every other day, cover with Kerlix  5.Needs low air loss mattress for home.  6. Needs podus boot or similar for BLE.  F/u [facility] 1-2 weeks following discharge.  Irene Limbo, MD [facility] Plastic [facility] Reconstructive Surgery [phone]

## 2014-11-03 NOTE — H&P (View-Only) (Signed)
Reason for Consult: several pressure ulcers Referring Physician: Primary team  [name] is an 64 y.o. female.  HPI: The patient is a 64 yrs old bf here for diarrhea that she has been dealing with for several days. Her family is in the room.  She is paraplegic and has developed ischial ulcers with the one on the left much worse than the right.  She also has a left heel and right lateral malleolus wound.  Her CDiff test has been negative to this point.  She continues to have diarrhea as noted on my exam today.  She is not ambulatory and requires assistance with her daily activities.  There is a large escar on the right ankle which be a wound to the bone.  The sacral area is a superficial small wound.  The left ischium is large with significant necrotic tissue.   Past Medical History  Diagnosis Date  . Hypertension   . Difficult intubation     Fastrach #4 LMA then # 7 ETT used in 2006 [facility], cervical laminectomy)  . Hypothyroidism   . Cervical neuropathy     PERIPHERAL NEUROPATHY , HAD CERVICAL FUSION  . PONV (postoperative nausea and vomiting)   . Hypercholesteremia   . Kidney stones 1990's    "twice; passed in hospital on my own" (04/13/2012)  . Arthritis     "all over" (04/13/2012)  . Breast cancer 2011    "right" (04/13/2012)    Past Surgical History  Procedure Laterality Date  . [place] luc  ~ 2003    "benign tumor removed from up under gum" (04/13/2012)  . Posterior cervical laminectomy  2006  . Eye surgery  2004    STENTS TO BIL EYES  . Tonsillectomy and adenoidectomy  ~ 1954  . Abdominal hysterectomy  ~ 1976  . Breast lumpectomy  2011    "right" (04/13/2012)  . Dacrocystorhinostomy  ~ 2000    "put stents in my tear ducts; both eyes" (04/13/2012)  . Total knee arthroplasty with revision components  04/13/2012    Procedure: TOTAL KNEE ARTHROPLASTY WITH REVISION COMPONENTS;  Surgeon: Velna Ochs, MD;  Location: [facility] OR;  Service: Orthopedics;  Laterality: Left;    Family  History  Problem Relation Age of Onset  . Heart disease Father     Social History:  reports that she has been smoking Cigarettes.  She has a 12 pack-year smoking history. She has never used smokeless tobacco. She reports that she does not drink alcohol or use illicit drugs.  Allergies:  Allergies  Allergen Reactions  . Ivp Dye [Iodinated Diagnostic Agents] Other (See Comments)    "hot and sweaty and almost passed out"  . Aspirin Hives  . Sulfa Antibiotics Hives    Medications: I have reviewed the patient's current medications.  Results for orders placed or performed during the hospital encounter of 10/31/14 (from the past 48 hour(s))  Clostridium Difficile by PCR (not at [facility])     Status: None   Collection Time: 10/31/14  4:17 PM  Result Value Ref Range   C difficile by pcr NEGATIVE NEGATIVE  CBC with Differential     Status: Abnormal   Collection Time: 10/31/14  9:11 PM  Result Value Ref Range   WBC 15.7 (H) 4.0 - 10.5 K/uL   RBC 2.93 (L) 3.87 - 5.11 MIL/uL   Hemoglobin 8.9 (L) 12.0 - 15.0 g/dL   HCT 55.6 (L) 95.4 - 14.5 %   MCV 94.9 78.0 - 100.0 fL  MCH 30.4 26.0 - 34.0 pg   MCHC 32.0 30.0 - 36.0 g/dL   RDW 14.0 11.5 - 15.5 %   Platelets 434 (H) 150 - 400 K/uL   Neutrophils Relative % 88 (H) 43 - 77 %   Neutro Abs 13.7 (H) 1.7 - 7.7 K/uL   Lymphocytes Relative 7 (L) 12 - 46 %   Lymphs Abs 1.2 0.7 - 4.0 K/uL   Monocytes Relative 5 3 - 12 %   Monocytes Absolute 0.8 0.1 - 1.0 K/uL   Eosinophils Relative 0 0 - 5 %   Eosinophils Absolute 0.0 0.0 - 0.7 K/uL   Basophils Relative 0 0 - 1 %   Basophils Absolute 0.0 0.0 - 0.1 K/uL  Basic metabolic panel     Status: Abnormal   Collection Time: 10/31/14  9:11 PM  Result Value Ref Range   Sodium 141 135 - 145 mmol/L   Potassium 2.9 (L) 3.5 - 5.1 mmol/L   Chloride 94 (L) 101 - 111 mmol/L   CO2 33 (H) 22 - 32 mmol/L   Glucose, Bld 101 (H) 65 - 99 mg/dL   BUN 10 6 - 20 mg/dL   Creatinine, Ser 0.84 0.44 - 1.00 mg/dL   Calcium  8.5 (L) 8.9 - 10.3 mg/dL   GFR calc non Af Amer >60 >60 mL/min   GFR calc Af Amer >60 >60 mL/min    Comment: (NOTE) The eGFR has been calculated using the CKD EPI equation. This calculation has not been validated in all clinical situations. eGFR's persistently <60 mL/min signify possible Chronic Kidney Disease.    Anion gap 14 5 - 15  Sedimentation rate     Status: Abnormal   Collection Time: 10/31/14  9:11 PM  Result Value Ref Range   Sed Rate 135 (H) 0 - 22 mm/hr  C-reactive protein     Status: Abnormal   Collection Time: 10/31/14  9:11 PM  Result Value Ref Range   CRP 28.3 (H) <1.0 mg/dL  Lipase, blood     Status: Abnormal   Collection Time: 10/31/14  9:11 PM  Result Value Ref Range   Lipase 21 (L) 22 - 51 U/L  Hepatic function panel     Status: Abnormal   Collection Time: 10/31/14  9:11 PM  Result Value Ref Range   Total Protein 7.1 6.5 - 8.1 g/dL   Albumin 2.0 (L) 3.5 - 5.0 g/dL   AST 25 15 - 41 U/L   ALT 12 (L) 14 - 54 U/L   Alkaline Phosphatase 54 38 - 126 U/L   Total Bilirubin 0.8 0.3 - 1.2 mg/dL   Bilirubin, Direct 0.2 0.1 - 0.5 mg/dL   Indirect Bilirubin 0.6 0.3 - 0.9 mg/dL  Magnesium     Status: None   Collection Time: 10/31/14  9:11 PM  Result Value Ref Range   Magnesium 2.0 1.7 - 2.4 mg/dL  Phosphorus     Status: None   Collection Time: 10/31/14  9:11 PM  Result Value Ref Range   Phosphorus 3.1 2.5 - 4.6 mg/dL  Culture, blood (routine x 2)     Status: None (Preliminary result)   Collection Time: 11/01/14 12:41 AM  Result Value Ref Range   Specimen Description BLOOD LEFT ANTECUBITAL    Special Requests BOTTLES DRAWN AEROBIC AND ANAEROBIC 4CC EA    Culture PENDING    Report Status PENDING   Basic metabolic panel     Status: Abnormal   Collection Time: 11/01/14 12:49 AM  Result Value Ref Range   Sodium 140 135 - 145 mmol/L   Potassium 3.1 (L) 3.5 - 5.1 mmol/L   Chloride 97 (L) 101 - 111 mmol/L   CO2 31 22 - 32 mmol/L   Glucose, Bld 91 65 - 99 mg/dL    BUN 11 6 - 20 mg/dL   Creatinine, Ser 0.91 0.44 - 1.00 mg/dL   Calcium 8.2 (L) 8.9 - 10.3 mg/dL   GFR calc non Af Amer >60 >60 mL/min   GFR calc Af Amer >60 >60 mL/min    Comment: (NOTE) The eGFR has been calculated using the CKD EPI equation. This calculation has not been validated in all clinical situations. eGFR's persistently <60 mL/min signify possible Chronic Kidney Disease.    Anion gap 12 5 - 15  CBC with Differential/Platelet     Status: Abnormal   Collection Time: 11/01/14 12:49 AM  Result Value Ref Range   WBC 13.4 (H) 4.0 - 10.5 K/uL    Comment: WHITE COUNT CONFIRMED ON SMEAR   RBC 2.88 (L) 3.87 - 5.11 MIL/uL   Hemoglobin 8.8 (L) 12.0 - 15.0 g/dL   HCT 27.6 (L) 36.0 - 46.0 %   MCV 95.8 78.0 - 100.0 fL   MCH 30.6 26.0 - 34.0 pg   MCHC 31.9 30.0 - 36.0 g/dL   RDW 14.0 11.5 - 15.5 %   Platelets 408 (H) 150 - 400 K/uL   Neutrophils Relative % 85 (H) 43 - 77 %   Lymphocytes Relative 9 (L) 12 - 46 %   Monocytes Relative 6 3 - 12 %   Eosinophils Relative 0 0 - 5 %   Basophils Relative 0 0 - 1 %   Neutro Abs 11.4 (H) 1.7 - 7.7 K/uL   Lymphs Abs 1.2 0.7 - 4.0 K/uL   Monocytes Absolute 0.8 0.1 - 1.0 K/uL   Eosinophils Absolute 0.0 0.0 - 0.7 K/uL   Basophils Absolute 0.0 0.0 - 0.1 K/uL   RBC Morphology POLYCHROMASIA PRESENT   Basic metabolic panel     Status: Abnormal   Collection Time: 11/01/14  5:40 AM  Result Value Ref Range   Sodium 139 135 - 145 mmol/L   Potassium 2.9 (L) 3.5 - 5.1 mmol/L   Chloride 96 (L) 101 - 111 mmol/L   CO2 34 (H) 22 - 32 mmol/L   Glucose, Bld 87 65 - 99 mg/dL   BUN 9 6 - 20 mg/dL   Creatinine, Ser 0.81 0.44 - 1.00 mg/dL   Calcium 8.3 (L) 8.9 - 10.3 mg/dL   GFR calc non Af Amer >60 >60 mL/min   GFR calc Af Amer >60 >60 mL/min    Comment: (NOTE) The eGFR has been calculated using the CKD EPI equation. This calculation has not been validated in all clinical situations. eGFR's persistently <60 mL/min signify possible Chronic  Kidney Disease.    Anion gap 9 5 - 15  CBC     Status: Abnormal   Collection Time: 11/01/14  5:40 AM  Result Value Ref Range   WBC 12.9 (H) 4.0 - 10.5 K/uL   RBC 2.95 (L) 3.87 - 5.11 MIL/uL   Hemoglobin 9.0 (L) 12.0 - 15.0 g/dL   HCT 28.0 (L) 36.0 - 46.0 %   MCV 94.9 78.0 - 100.0 fL   MCH 30.5 26.0 - 34.0 pg   MCHC 32.1 30.0 - 36.0 g/dL   RDW 14.0 11.5 - 15.5 %   Platelets 410 (H) 150 - 400 K/uL  Urinalysis, Routine w reflex microscopic (not at [facility])     Status: Abnormal   Collection Time: 11/01/14 11:27 AM  Result Value Ref Range   Color, Urine YELLOW YELLOW   APPearance CLOUDY (A) CLEAR   Specific Gravity, Urine 1.016 1.005 - 1.030   pH 8.5 (H) 5.0 - 8.0   Glucose, UA NEGATIVE NEGATIVE mg/dL   Hgb urine dipstick NEGATIVE NEGATIVE   Bilirubin Urine NEGATIVE NEGATIVE   Ketones, ur NEGATIVE NEGATIVE mg/dL   Protein, ur NEGATIVE NEGATIVE mg/dL   Urobilinogen, UA 1.0 0.0 - 1.0 mg/dL   Nitrite NEGATIVE NEGATIVE   Leukocytes, UA MODERATE (A) NEGATIVE  Urine microscopic-add on     Status: None   Collection Time: 11/01/14 11:27 AM  Result Value Ref Range   Squamous Epithelial / LPF RARE RARE   WBC, UA 3-6 <3 WBC/hpf   Bacteria, UA RARE RARE  Ferritin     Status: Abnormal   Collection Time: 11/01/14 12:31 PM  Result Value Ref Range   Ferritin 1029 (H) 11 - 307 ng/mL  Prealbumin     Status: Abnormal   Collection Time: 11/01/14 12:31 PM  Result Value Ref Range   Prealbumin 8.2 (L) 18 - 38 mg/dL    Ct Abdomen Pelvis W Contrast  11/01/2014   CLINICAL DATA:  Right and left upper quadrant pain. History of paraplegia with sacral decubitus ulcer.  EXAM: CT ABDOMEN AND PELVIS WITH CONTRAST  TECHNIQUE: Multidetector CT imaging of the abdomen and pelvis was performed using the standard protocol following bolus administration of intravenous contrast.  CONTRAST:  23mL OMNIPAQUE IOHEXOL 300 MG/ML SOLN, 37mL OMNIPAQUE IOHEXOL 300 MG/ML SOLN  COMPARISON:  None.  FINDINGS: Lower chest:  The  included lung bases are clear.  Hepatobiliary: The liver is prominent in size with decreased density. Subcentimeter 8 mm hypodensity in the right hepatic lobe. Gallbladder is physiologically distended. No calcified gallstones. Mild diffuse prominence of the common bile duct measuring 9 mm in the mid distal portion. No calcified choledocholithiasis.  Pancreas: Mild atrophy without ductal dilatation or surrounding inflammation.  Spleen: Normal in size with scattered splenic granulomas.  Adrenals/Urinary Tract: Asymmetric renal size with right kidney smaller than left and thinning of the right renal parenchyma. Bilateral nonobstructing renal calculi. No hydronephrosis or obstructive uropathy. Simple cyst in the upper left kidney measures 1.4 cm. No adrenal nodule.  Stomach/Bowel: Stomach is decompressed. There are no dilated or thickened small bowel loops. No obstruction, oral contrast is seen throughout the colon. Small volume of colonic stool. The appendix is normal.  Vascular/Lymphatic: No retroperitoneal adenopathy. Abdominal aorta is normal in caliber. Calcified and noncalcified atheromatous plaque about the abdominal aorta and its branches, no aneurysm. Prominent left inguinal lymph nodes.  Reproductive: Uterus surgically absent.  No adnexal mass.  Bladder: Physiologically distended with questionable bladder wall thickening.  Other: Soft tissue air and induration about the posterior medial left upper thigh, partially included. Scattered foci of air in the left flank soft tissues. Soft tissue edema posterior to the sacrum.  Musculoskeletal: Multilevel degenerative change throughout the lumbar spine. There are no acute or suspicious osseous abnormalities.  IMPRESSION: 1. Soft tissue air and induration about the posterior medial left upper thigh, partially included. This is likely related to decubitus ulcer. Scattered air in the left flank soft tissues. 2. Bilateral nonobstructing renal calculi. Question of bladder  wall thickening, correlation with urinalysis recommended to exclude urinary tract infection. Asymmetric renal size with scarring in the right kidney. 3. Prominence of  the extrahepatic biliary tree without definable cause. No gallbladder distention. 4. Hepatomegaly and hepatic steatosis. 5. Atherosclerosis.   Electronically Signed   By: Jeb Levering M.D.   On: 11/01/2014 04:26    Review of Systems  Eyes: Negative.   Respiratory: Negative.   Cardiovascular: Negative.   Gastrointestinal: Positive for diarrhea.  Genitourinary: Negative.   Skin: Negative.   Neurological: Positive for weakness.  Psychiatric/Behavioral: Negative.    Blood pressure 129/64, pulse 107, temperature 98.5 F (36.9 C), temperature source Oral, resp. rate 18, height $RemoveBe'5\' 3"'[id number]$  (1.6 m), weight 77.111 kg (170 lb), SpO2 98 %. Physical Exam  Constitutional: She is oriented to person, place, and time. She appears well-developed and well-nourished.  HENT:  Head: Normocephalic and atraumatic.  Eyes: Conjunctivae and EOM are normal. Pupils are equal, round, and reactive to light.  Cardiovascular: Normal rate.   Respiratory: Effort normal.  GI: Soft.  Musculoskeletal:       Legs:      Feet:  Neurological: She is alert and oriented to person, place, and time.  Psychiatric: She has a normal mood and affect. Her behavior is normal. Thought content normal.    Assessment/Plan: Recommend maximizing nutritional status with protein supplements provided the bowels gain control.  Multivitamin, vitamin C and zinc daily.  Air mattress bed is important to keep the patient off loaded since she requires full assistance.  Turning at least every 2 hours with padding is essential.  Will need debridement of at least the left ischium and right ankle.  Working on OR schedule.  Prealbumin ~8.  Nutrition consult if not done already. Thank you for the consult.  [place] 11/01/2014, 7:23 PM

## 2014-11-03 NOTE — Progress Notes (Signed)
ANTIBIOTIC CONSULT NOTE - FOLLOW UP  Pharmacy Consult for Vancocin and Zosyn Indication: decubitus ulcer   Labs:  Recent Labs  11/01/14 0540 11/02/14 1154 11/03/14 0318  WBC 12.9* 10.7* 10.9*  HGB 9.0* 8.6* 8.6*  PLT 410* 376 370  CREATININE 0.81 0.74 0.82   Estimated Creatinine Clearance: 66.4 mL/min (by C-G formula based on Cr of 0.82).  Recent Labs  11/03/14 0318  [facility] 14     Microbiology: Recent Results (from the past 720 hour(s))  Clostridium Difficile by PCR (not at [facility])     Status: None   Collection Time: 10/31/14  4:17 PM  Result Value Ref Range Status   C difficile by pcr NEGATIVE NEGATIVE Final  Culture, blood (routine x 2)     Status: None (Preliminary result)   Collection Time: 11/01/14 12:41 AM  Result Value Ref Range Status   Specimen Description BLOOD LEFT ANTECUBITAL  Final   Special Requests BOTTLES DRAWN AEROBIC AND ANAEROBIC 4CC EA  Final   Culture NO GROWTH 1 DAY  Final   Report Status PENDING  Incomplete  Culture, blood (routine x 2)     Status: None (Preliminary result)   Collection Time: 11/01/14 12:49 AM  Result Value Ref Range Status   Specimen Description BLOOD LEFT ANTECUBITAL  Final   Special Requests BOTTLES DRAWN AEROBIC ONLY 4CC  Final   Culture NO GROWTH 1 DAY  Final   Report Status PENDING  Incomplete  Surgical pcr screen     Status: Abnormal   Collection Time: 11/02/14  4:20 AM  Result Value Ref Range Status   MRSA, PCR POSITIVE (A) NEGATIVE Final    Comment: RESULT CALLED TO, READ BACK BY AND VERIFIED WITH: C HUCKBEE,RN [id number] WILDERK    Staphylococcus aureus POSITIVE (A) NEGATIVE Final    Comment:        The Xpert SA Assay (FDA approved for NASAL specimens in patients over 84 years of age), is one component of a comprehensive surveillance program.  Test performance has been validated by [facility] for patients greater than or equal to 19 year old. It is not intended to diagnose infection nor to guide or  monitor treatment. RESULT CALLED TO, READ BACK BY AND VERIFIED WITHPerry Mount [id number] [place]      Assessment/Plan:  Lagi.Manning female therapeutic on vancomycin with initial dosing for decubitus ulcer.  Will continue vanc and Zosyn and monitor.  Wynona Neat, PharmD, BCPS  11/03/2014,3:54 AM

## 2014-11-04 MED ORDER — UNJURY VANILLA POWDER
2.0000 [oz_av] | Freq: Four times a day (QID) | ORAL | Status: DC
  Administered 2014-11-05: 2 [oz_av] via ORAL
  Filled 2014-11-04 (×20): qty 27

## 2014-11-04 MED ORDER — UNJURY CHOCOLATE CLASSIC POWDER: 2.0000 [oz_av] | Freq: Four times a day (QID) | ORAL | Status: DC

## 2014-11-04 MED ORDER — UNJURY CHOCOLATE CLASSIC POWDER
2.0000 [oz_av] | Freq: Four times a day (QID) | ORAL | Status: DC
  Administered 2014-11-05 – 2014-11-08 (×7): 2 [oz_av] via ORAL
  Filled 2014-11-04 (×20): qty 27

## 2014-11-04 MED ORDER — UNJURY VANILLA POWDER: 2.0000 [oz_av] | Freq: Four times a day (QID) | ORAL | Status: DC

## 2014-11-04 NOTE — Progress Notes (Signed)
1 Day Post-Op  Subjective: Little pain from debridements. Likes Breeze protein supplements, but not eating well this am.  Await cultures from OR, showing no growth thus far.  Dressing changes as outlined by Dr. Iran Planas Objective: Vital signs in last 24 hours: Temp:  [97.4 F (36.3 C)-99 F (37.2 C)] 98.9 F (37.2 C) (07/30 0519) Pulse Rate:  [65-84] 78 (07/30 0519) Resp:  [10-18] 16 (07/30 0519) BP: (111-151)/(47-101) 125/58 mmHg (07/30 0519) SpO2:  [94 %-100 %] 100 % (07/30 0519) Last BM Date: 11/02/14  Intake/Output from previous day: 07/29 0701 - 07/30 0700 In: 2850 [P.O.:750; I.V.:600; IV Piggyback:1500] Out: 1250 [Urine:1250] Intake/Output this shift:    Lab Results:   Recent Labs  11/02/14 1154 11/03/14 0318  WBC 10.7* 10.9*  HGB 8.6* 8.6*  HCT 26.6*  26.9* 26.5*  PLT 376 370   BMET  Recent Labs  11/02/14 1154 11/03/14 0318  NA 140 140  K 3.0* 3.5  CL 103 105  CO2 30 27  GLUCOSE 95 108*  BUN 7 7  CREATININE 0.74 0.82  CALCIUM 8.3* 8.1*   PT/INR No results for input(s): LABPROT, INR in the last 72 hours. ABG No results for input(s): PHART, HCO3 in the last 72 hours.  Invalid input(s): PCO2, PO2  Studies/Results: No results found.  Anti-infectives: Anti-infectives    Start     Dose/Rate Route Frequency Ordered Stop   11/03/14 1100  bacitracin 50,000 Units, gentamicin (GARAMYCIN) 80 mg, ceFAZolin (ANCEF) 1 g in sodium chloride 0.9 % 1,000 mL      Irrigation Once 11/03/14 1050     11/01/14 1400  piperacillin-tazobactam (ZOSYN) IVPB 3.375 g     3.375 g 12.5 mL/hr over 240 Minutes Intravenous 3 times per day 11/01/14 0308     11/01/14 0330  vancomycin (VANCOCIN) IVPB 750 mg/150 ml premix     750 mg 150 mL/hr over 60 Minutes Intravenous Every 12 hours 11/01/14 0308     11/01/14 0330  piperacillin-tazobactam (ZOSYN) IVPB 3.375 g     3.375 g 100 mL/hr over 30 Minutes Intravenous  Once 11/01/14 0308 11/01/14 0435      Assessment/Plan: s/p  Procedure(s): IRRIGATION AND DEBRIDEMENT SACRAL ULCER (Left) IRRIGATION AND DEBRIDEMENT BILATERAL HEEL (Bilateral) Await culture results from OR Dressing changes as outlined by Dr. Para Skeans note 7/29 Protein supplements, MVI, Vitamins and Zinc Pressure relief mattress and boots for home Plan follow up in the [facility] following discharge [phone]  LOS: 3 days    RAYBURN,SHAWN,PA-C Plastic Surgery [phone]

## 2014-11-04 NOTE — Progress Notes (Signed)
Subjective: Patient has no complaints today. Lying in bed, family not present. Does not like heart healthy diet and would like a regular diet.  Objective: Vital signs in last 24 hours: Filed Vitals:   11/03/14 2137 11/04/14 0149 11/04/14 0519 11/04/14 1500  BP: 111/50 121/67 125/58 121/72  Pulse: 71 77 78 78  Temp: 98.5 F (36.9 C) 99 F (37.2 C) 98.9 F (37.2 C) 98.5 F (36.9 C)  TempSrc: Oral Oral Oral Oral  Resp: 16 16 16 16   Height:      Weight:      SpO2: 97% 94% 100% 98%   Weight change:   Intake/Output Summary (Last 24 hours) at 11/04/14 1641 Last data filed at 11/04/14 0600  Gross per 24 hour  Intake   2250 ml  Output    650 ml  Net   1600 ml   General appearance: alert, cooperative and appears stated age Head: Moist oral mucosa, AT, [place]. Lungs: Clear, no added sounds. Heart: regular rate and rhythm, S1, S2 normal, no murmur appreciated Abdomen: soft, non-tender; bowel sounds present Extremities: extremities normal, atraumatic, no cyanosis or edema, 2+ pulses bilat  Lab Results: Basic Metabolic Panel:  Recent Labs Lab 10/31/14 2111  11/02/14 1154 11/03/14 0318  NA 141  < > 140 140  K 2.9*  < > 3.0* 3.5  CL 94*  < > 103 105  CO2 33*  < > 30 27  GLUCOSE 101*  < > 95 108*  BUN 10  < > 7 7  CREATININE 0.84  < > 0.74 0.82  CALCIUM 8.5*  < > 8.3* 8.1*  MG 2.0  --   --   --   PHOS 3.1  --   --   --   < > = values in this interval not displayed. Liver Function Tests:  Recent Labs Lab 10/31/14 2111  AST 25  ALT 12*  ALKPHOS 54  BILITOT 0.8  PROT 7.1  ALBUMIN 2.0*    Recent Labs Lab 10/31/14 2111  LIPASE 21*   CBC:  Recent Labs Lab 10/31/14 2111 11/01/14 0049  11/02/14 1154 11/03/14 0318  WBC 15.7* 13.4*  < > 10.7* 10.9*  NEUTROABS 13.7* 11.4*  --   --   --   HGB 8.9* 8.8*  < > 8.6* 8.6*  HCT 27.8* 27.6*  < > 26.6*  26.9* 26.5*  MCV 94.9 95.8  < > 94.7 94.6  PLT 434* 408*  < > 376 370  < > = values in this interval not  displayed. Anemia Panel:  Recent Labs Lab 11/01/14 1231 11/02/14 1153 11/02/14 1154  VITAMINB12  --  413  --   FERRITIN 1029*  --   --   TIBC  --   --  185*  IRON  --   --  20*   Urinalysis:  Recent Labs Lab 11/01/14 1127  COLORURINE YELLOW  LABSPEC 1.016  PHURINE 8.5*  GLUCOSEU NEGATIVE  HGBUR NEGATIVE  BILIRUBINUR NEGATIVE  KETONESUR NEGATIVE  PROTEINUR NEGATIVE  UROBILINOGEN 1.0  NITRITE NEGATIVE  LEUKOCYTESUR MODERATE*    Medications: I have reviewed the patient's current medications. Scheduled Meds: . anastrozole  1 mg Oral QHS  . Bacitracin 50,000 units, gentamicin 80mg  & cefazolin 1 g in normal saline (1064ml) mastectomy irrigation   Irrigation Once  . baclofen  30 mg Oral BID  . Chlorhexidine Gluconate Cloth  6 each Topical Q0600  . DULoxetine  60 mg Oral QPC supper  . enoxaparin (LOVENOX)  injection  40 mg Subcutaneous Q24H  . feeding supplement  1 Container Oral TID BM  . ferrous sulfate  325 mg Oral Daily  . folic acid  1 mg Oral QPC supper  . gabapentin  1,600 mg Oral BID  . levothyroxine  125 mcg Oral QAC breakfast  . lidocaine  5 mL Other Once  . metoprolol  100 mg Oral QPC supper  . morphine  60 mg Oral Q12H  . multivitamin with minerals  1 tablet Oral Daily  . mupirocin ointment  1 application Nasal BID  . NIFEdipine  30 mg Oral Daily  . pantoprazole  40 mg Oral Daily  . piperacillin-tazobactam (ZOSYN)  IV  3.375 g Intravenous 3 times per day  . protein supplement  2 oz Oral QID   Or  . protein supplement  2 oz Oral QID  . sodium hypochlorite   Irrigation q12n4p  . vancomycin  750 mg Intravenous Q12H  . vitamin C  500 mg Oral Daily  . zinc sulfate  220 mg Oral Daily   Continuous Infusions: . lactated ringers     PRN Meds:.acetaminophen, loperamide, oxyCODONE-acetaminophen **AND** oxyCODONE, simethicone Assessment/Plan:  Abdominal Pain/Diarrhea: Improving. Abdomen soft.C. Diff neg. On Antibiotics to cover decub ulcers.  - Loperamide as  needed - Simethicone - D/c IVF - regular diet  Decubitus Ulcers: Seen by Plastic surgery. Surgery- 11/03/2014. Tolerated procedure. Awaiting tissue cultures. ED notes gluteal ulcer extends to muscle (stage 4). Other ulcers stage 1-2, with one draining pus.  - Appreciate Plastics recs - BCx- f/u so far NG - Tissue cultures - Vanc/Zosyn per pharmacy, cont for now, pending cultures  Anemia: Hgb 8.6, down from 13 in 09/2014.MCV not suggestive of B12 defc.Patient already on Folic acid supplementation. Ferritin elevated, but patient has decub ulcers likely infected. Considering acute drop, possibly related to infection or GI bleed, so far FOBT X2 negs, no dark stools.   - FOBT - CBC in AM - Folate- normal  - Serum Iron- Low, iron saturation- low, both supportive of Iron defc, but low TIBC- which should be high with fe defc.  - B12- WNL. - Protonix 40mg  daily - Cont Oral iron suppliments - Colonoscopy outpt  - Consider d/c anticoag if Hgb drops.  HTN: stable - Nifedipine 30 mg - Metoprolol Succ 100 mg   Hypokalemia- Improved- K- 3.5 today. Likley due to diarrhea. - Bmet am  Hypothyroid: stable - Levothyroxine 125 mcg daily  Paraplegia:  - Baclofen 20 mg BID - Gabapentin 1600 mg BID  Depression: - Cymbalta  H/o Breast Cancer: stable. Last mammogram Jan 2015. Followed by Susanne Borders NP ([place]). - Anastrozole 1 mg daily  FEN/GI - Bmet am - Regular diet, pt does not want heart healthy  DVT Ppx: Lovenox  Dispo: Disposition is deferred at this time, awaiting improvement of current medical problems.     LOS: 3 days   Bethena Roys, MD 11/04/2014, 4:41 PM

## 2014-11-04 NOTE — Progress Notes (Signed)
Subjective: No complaints today. Recovering well from OR. No stools O/N. Has an appetite but unhappy with Heart Healthy diet. Likes Boost Breeze supplements.  Objective: Vital signs in last 24 hours: Filed Vitals:   11/03/14 1856 11/03/14 2137 11/04/14 0149 11/04/14 0519  BP: 124/47 111/50 121/67 125/58  Pulse: 84 71 77 78  Temp: 98.5 F (36.9 C) 98.5 F (36.9 C) 99 F (37.2 C) 98.9 F (37.2 C)  TempSrc: Oral Oral Oral Oral  Resp: 16 16 16 16   Height:      Weight:      SpO2: 98% 97% 94% 100%   Weight change:   Intake/Output Summary (Last 24 hours) at 11/04/14 1937 Last data filed at 11/04/14 0600  Gross per 24 hour  Intake   2850 ml  Output   1250 ml  Net   1600 ml   General appearance: alert, cooperative and appears stated age Head: Normocephalic, without obvious abnormality, atraumatic Lungs: clear to auscultation bilaterally Heart: regular rate and rhythm, S1, S2 normal, 2/6 systolic murmur Abdomen: tense at baseline, non-tender throughout except mild TTP epigastric region; bowel sounds normal Extremities: extremities normal, atraumatic, no cyanosis or edema Sacral Ulcer, Ischial Ulcer, R Ankle Ulcer, L heel Ulcer bandaged post-op (see Op note 7/29 for description)  Lab Results: Basic Metabolic Panel:  Recent Labs Lab 10/31/14 2111  11/02/14 1154 11/03/14 0318  NA 141  < > 140 140  K 2.9*  < > 3.0* 3.5  CL 94*  < > 103 105  CO2 33*  < > 30 27  GLUCOSE 101*  < > 95 108*  BUN 10  < > 7 7  CREATININE 0.84  < > 0.74 0.82  CALCIUM 8.5*  < > 8.3* 8.1*  MG 2.0  --   --   --   PHOS 3.1  --   --   --   < > = values in this interval not displayed. Liver Function Tests:  Recent Labs Lab 10/31/14 2111  AST 25  ALT 12*  ALKPHOS 54  BILITOT 0.8  PROT 7.1  ALBUMIN 2.0*    Recent Labs Lab 10/31/14 2111  LIPASE 21*   CBC:  Recent Labs Lab 10/31/14 2111 11/01/14 0049  11/02/14 1154 11/03/14 0318  WBC 15.7* 13.4*  < > 10.7* 10.9*  NEUTROABS 13.7*  11.4*  --   --   --   HGB 8.9* 8.8*  < > 8.6* 8.6*  HCT 27.8* 27.6*  < > 26.6*  26.9* 26.5*  MCV 94.9 95.8  < > 94.7 94.6  PLT 434* 408*  < > 376 370  < > = values in this interval not displayed. Anemia Panel:  Recent Labs Lab 11/01/14 1231 11/02/14 1153 11/02/14 1154  VITAMINB12  --  413  --   FERRITIN 1029*  --   --   TIBC  --   --  185*  IRON  --   --  20*   Urinalysis:  Recent Labs Lab 11/01/14 1127  COLORURINE YELLOW  LABSPEC 1.016  PHURINE 8.5*  GLUCOSEU NEGATIVE  HGBUR NEGATIVE  BILIRUBINUR NEGATIVE  KETONESUR NEGATIVE  PROTEINUR NEGATIVE  UROBILINOGEN 1.0  NITRITE NEGATIVE  LEUKOCYTESUR MODERATE*   Micro Results: Recent Results (from the past 240 hour(s))  Clostridium Difficile by PCR (not at [facility])     Status: None   Collection Time: 10/31/14  4:17 PM  Result Value Ref Range Status   C difficile by pcr NEGATIVE NEGATIVE Final  Culture,  blood (routine x 2)     Status: None (Preliminary result)   Collection Time: 11/01/14 12:41 AM  Result Value Ref Range Status   Specimen Description BLOOD LEFT ANTECUBITAL  Final   Special Requests BOTTLES DRAWN AEROBIC AND ANAEROBIC 4CC EA  Final   Culture NO GROWTH 2 DAYS  Final   Report Status PENDING  Incomplete  Culture, blood (routine x 2)     Status: None (Preliminary result)   Collection Time: 11/01/14 12:49 AM  Result Value Ref Range Status   Specimen Description BLOOD LEFT ANTECUBITAL  Final   Special Requests BOTTLES DRAWN AEROBIC ONLY 4CC  Final   Culture NO GROWTH 2 DAYS  Final   Report Status PENDING  Incomplete  Surgical pcr screen     Status: Abnormal   Collection Time: 11/02/14  4:20 AM  Result Value Ref Range Status   MRSA, PCR POSITIVE (A) NEGATIVE Final    Comment: RESULT CALLED TO, READ BACK BY AND VERIFIED WITH: C HUCKBEE,RN [id number] WILDERK    Staphylococcus aureus POSITIVE (A) NEGATIVE Final    Comment:        The Xpert SA Assay (FDA approved for NASAL specimens in patients over 32  years of age), is one component of a comprehensive surveillance program.  Test performance has been validated by [facility] for patients greater than or equal to 62 year old. It is not intended to diagnose infection nor to guide or monitor treatment. RESULT CALLED TO, READ BACK BY AND VERIFIED WITHPerry Mount [id number] [place]   Tissue culture     Status: None (Preliminary result)   Collection Time: 11/03/14  1:09 PM  Result Value Ref Range Status   Specimen Description TISSUE  Final   Special Requests  RIGHT FIBULA  Final   Gram Stain PENDING  Incomplete   Culture   Final    NO GROWTH 1 DAY Performed at [facility]    Report Status PENDING  Incomplete   Studies/Results: No results found. Medications: I have reviewed the patient's current medications. Scheduled Meds: . anastrozole  1 mg Oral QHS  . Bacitracin 50,000 units, gentamicin 80mg  & cefazolin 1 g in normal saline (1079ml) mastectomy irrigation   Irrigation Once  . baclofen  30 mg Oral BID  . Chlorhexidine Gluconate Cloth  6 each Topical Q0600  . DULoxetine  60 mg Oral QPC supper  . enoxaparin (LOVENOX) injection  40 mg Subcutaneous Q24H  . feeding supplement  1 Container Oral TID BM  . ferrous sulfate  325 mg Oral Daily  . folic acid  1 mg Oral QPC supper  . gabapentin  1,600 mg Oral BID  . levothyroxine  125 mcg Oral QAC breakfast  . lidocaine  5 mL Other Once  . metoprolol  100 mg Oral QPC supper  . morphine  60 mg Oral Q12H  . multivitamin with minerals  1 tablet Oral Daily  . mupirocin ointment  1 application Nasal BID  . NIFEdipine  30 mg Oral Daily  . pantoprazole  40 mg Oral Daily  . piperacillin-tazobactam (ZOSYN)  IV  3.375 g Intravenous 3 times per day  . sodium hypochlorite   Irrigation q12n4p  . vancomycin  750 mg Intravenous Q12H  . vitamin C  500 mg Oral Daily  . zinc sulfate  220 mg Oral Daily   Continuous Infusions: . lactated ringers     PRN Meds:.acetaminophen,  loperamide, oxyCODONE-acetaminophen **AND** oxyCODONE, simethicone Assessment/Plan: Principal Problem:  Sacral ulcer Active Problems:   HTN (hypertension)   Breast cancer of lower-inner quadrant of right female breast   Hypothyroidism   Abdominal pain   Paraplegia   Hypokalemia  Abdominal Pain/Diarrhea: Resolved, still complains of intermittent "gas" pain. Afeb. Abdomen chronically tonic 2/2 spinal cord injury, Non-tender.C. Diff neg. On Antibiotics to cover decub ulcers, may be contributing to loose stool.  - Loperamide as needed - Simethicone - Protonix po 40mg  qD - Tolerating P.O. Nutrition well   Decubitus Ulcers: s/p debridement 7/29. Op note indicates 5x7x7cm ischial wound, 4.5x6x1cm L heal wound, 8.2x4.5x1cm R lat ankle with exposed bone (cultured). WBC downtrending to 10.9 today (15.7 on admission). - Appreciate wound care recs:  - Air matress, float heels, foam dressing - Appreciate nutrition recs:  - Increase protein intake, taking Boost Breeze (9g Prot) but will try powder supplement (21g Prot)  - Multivit, Vit C, Zinc - Appreciate Plastics recs, post-op care:  - Continue abx until cx results, continue only if positive  - Sacrum wound: foam dressing change q week  - L ischium: silver alginate q 2d covered with absorptive dressing  - L heel: silver alginate q 2d covered with kerlix plus heel foam  - R lat ankle: silver alginate q 2d covered with kerlix  - BLE podus boot  - f/u 2 weeks @ [facility] - f/u Wound Cx - NGx1d - Vanc/Zosyn per pharmacy, cont for now, will narrow if needed following wound culture  Anemia: Stable. Hgb 8.6, down from 13 in 09/2014. Fe studies indicate chronic dz. B12/Folate WNL, FOBT neg. - CBC in AM - Continue po FeSO4 - Continue folate suppl - Colonoscopy as outpt - Consider d/c anticoag if Hgb drops.  HTN: stable - Nifedipine 30 mg - Metoprolol Succ 100 mg   Hypothyroid: stable - Levothyroxine 125 mcg  daily  Paraplegia:  - Baclofen 20 mg BID - Gabapentin 1600 mg BID  Depression: - Cymbalta  H/o Breast Cancer: stable. Last mammogram Jan 2015. Followed by Susanne Borders NP ([place]). - Anastrozole 1 mg daily  FEN/GI - Bmet in AM - Reg diet  DVT Ppx: Lovenox 40mg  qD  Dispo: Disposition is deferred at this time, awaiting wound culture results from OR to dictate abx therapy    LOS: 3 days   Effie Berkshire, Med Student 11/04/2014, 8:33 AM

## 2014-11-05 LAB — COMPREHENSIVE METABOLIC PANEL
ALT: 15 U/L (ref 14–54)
AST: 34 U/L (ref 15–41)
Albumin: 1.7 g/dL — ABNORMAL LOW (ref 3.5–5.0)
Alkaline Phosphatase: 49 U/L (ref 38–126)
Anion gap: 8 (ref 5–15)
BUN: 11 mg/dL (ref 6–20)
CALCIUM: 8.1 mg/dL — AB (ref 8.9–10.3)
CO2: 23 mmol/L (ref 22–32)
Chloride: 108 mmol/L (ref 101–111)
Creatinine, Ser: 1.01 mg/dL — ABNORMAL HIGH (ref 0.44–1.00)
GFR calc non Af Amer: 57 mL/min — ABNORMAL LOW (ref >60)
GLUCOSE: 99 mg/dL (ref 65–99)
Potassium: 4.4 mmol/L (ref 3.5–5.1)
SODIUM: 139 mmol/L (ref 135–145)
Total Bilirubin: 0.6 mg/dL (ref 0.3–1.2)
Total Protein: 6.6 g/dL (ref 6.5–8.1)

## 2014-11-05 LAB — CBC
HCT: 26 % — ABNORMAL LOW (ref 36.0–46.0)
HEMOGLOBIN: 8.2 g/dL — AB (ref 12.0–15.0)
MCH: 30.1 pg (ref 26.0–34.0)
MCHC: 31.5 g/dL (ref 30.0–36.0)
MCV: 95.6 fL (ref 78.0–100.0)
Platelets: 408 10*3/uL — ABNORMAL HIGH (ref 150–400)
RBC: 2.72 MIL/uL — ABNORMAL LOW (ref 3.87–5.11)
RDW: 14.4 % (ref 11.5–15.5)
WBC: 9.9 10*3/uL (ref 4.0–10.5)

## 2014-11-05 LAB — MAGNESIUM: Magnesium: 2.3 mg/dL (ref 1.7–2.4)

## 2014-11-05 LAB — HIV ANTIBODY (ROUTINE TESTING W REFLEX): HIV Screen 4th Generation wRfx: NONREACTIVE

## 2014-11-05 MED ORDER — PANTOPRAZOLE SODIUM 40 MG PO TBEC
40.0000 mg | DELAYED_RELEASE_TABLET | Freq: Two times a day (BID) | ORAL | Status: DC
  Administered 2014-11-05 – 2014-11-08 (×6): 40 mg via ORAL
  Filled 2014-11-05 (×6): qty 1

## 2014-11-05 MED ORDER — SODIUM CHLORIDE 0.9 % IV SOLN
INTRAVENOUS | Status: AC
  Administered 2014-11-05: 18:00:00 via INTRAVENOUS

## 2014-11-05 NOTE — Progress Notes (Signed)
Subjective:  Pt seen and examined in AM. No acute events overnight. She reports pain around her lower chest extending into her epigastric region and thinks it may be due to muscle spasms. She reports postprandial fullness with no nausea or vomiting. She denies pain in her sacral region or LE.  She continues to have diarrhea and sensation of bleach in her mouth.   Objective: Vital signs in last 24 hours: Filed Vitals:   11/04/14 1500 11/04/14 2117 11/05/14 0604 11/05/14 1410  BP: 121/72 131/43 145/48 134/52  Pulse: 78 80 81 82  Temp: 98.5 F (36.9 C) 99.3 F (37.4 C) 98.6 F (37 C) 98 F (36.7 C)  TempSrc: Oral Oral Oral Oral  Resp: 16 16 16 18   Height:      Weight:      SpO2: 98% 99% 97% 99%   Weight change:   Intake/Output Summary (Last 24 hours) at 11/05/14 1701 Last data filed at 11/05/14 1410  Gross per 24 hour  Intake    386 ml  Output   1750 ml  Net  -1364 ml   PHYSICAL EXAMINATION:  General: NAD  Heart: Normal rate and rhythm  Lungs: Anterior fields clear to ausculation with no wheezing, ronchi, or rales Abdomen: Soft, tender in epigastric region, non-distended, normal BS Extremities: No edema. Right ankle and left heel wrapped  Neuro: Paraplegic   Lab Results: Basic Metabolic Panel:  Recent Labs Lab 10/31/14 2111  11/03/14 0318 11/05/14 0455 11/05/14 0550  NA 141  < > 140 139  --   K 2.9*  < > 3.5 4.4  --   CL 94*  < > 105 108  --   CO2 33*  < > 27 23  --   GLUCOSE 101*  < > 108* 99  --   BUN 10  < > 7 11  --   CREATININE 0.84  < > 0.82 1.01*  --   CALCIUM 8.5*  < > 8.1* 8.1*  --   MG 2.0  --   --   --  2.3  PHOS 3.1  --   --   --   --   < > = values in this interval not displayed. Liver Function Tests:  Recent Labs Lab 10/31/14 2111 11/05/14 0455  AST 25 34  ALT 12* 15  ALKPHOS 54 49  BILITOT 0.8 0.6  PROT 7.1 6.6  ALBUMIN 2.0* 1.7*    Recent Labs Lab 10/31/14 2111  LIPASE 21*   CBC:  Recent Labs Lab 10/31/14 2111  11/01/14 0049  11/03/14 0318 11/05/14 0550  WBC 15.7* 13.4*  < > 10.9* 9.9  NEUTROABS 13.7* 11.4*  --   --   --   HGB 8.9* 8.8*  < > 8.6* 8.2*  HCT 27.8* 27.6*  < > 26.5* 26.0*  MCV 94.9 95.8  < > 94.6 95.6  PLT 434* 408*  < > 370 408*  < > = values in this interval not displayed. Anemia Panel:  Recent Labs Lab 11/01/14 1231 11/02/14 1153 11/02/14 1154  VITAMINB12  --  413  --   FERRITIN 1029*  --   --   TIBC  --   --  185*  IRON  --   --  20*   Urinalysis:  Recent Labs Lab 11/01/14 1127  COLORURINE YELLOW  LABSPEC 1.016  PHURINE 8.5*  GLUCOSEU NEGATIVE  HGBUR NEGATIVE  BILIRUBINUR NEGATIVE  KETONESUR NEGATIVE  PROTEINUR NEGATIVE  UROBILINOGEN 1.0  NITRITE  NEGATIVE  LEUKOCYTESUR MODERATE*     Micro Results: Recent Results (from the past 240 hour(s))  Clostridium Difficile by PCR (not at [facility])     Status: None   Collection Time: 10/31/14  4:17 PM  Result Value Ref Range Status   C difficile by pcr NEGATIVE NEGATIVE Final  Culture, blood (routine x 2)     Status: None (Preliminary result)   Collection Time: 11/01/14 12:41 AM  Result Value Ref Range Status   Specimen Description BLOOD LEFT ANTECUBITAL  Final   Special Requests BOTTLES DRAWN AEROBIC AND ANAEROBIC 4CC EA  Final   Culture NO GROWTH 4 DAYS  Final   Report Status PENDING  Incomplete  Culture, blood (routine x 2)     Status: None (Preliminary result)   Collection Time: 11/01/14 12:49 AM  Result Value Ref Range Status   Specimen Description BLOOD LEFT ANTECUBITAL  Final   Special Requests BOTTLES DRAWN AEROBIC ONLY 4CC  Final   Culture NO GROWTH 4 DAYS  Final   Report Status PENDING  Incomplete  Surgical pcr screen     Status: Abnormal   Collection Time: 11/02/14  4:20 AM  Result Value Ref Range Status   MRSA, PCR POSITIVE (A) NEGATIVE Final    Comment: RESULT CALLED TO, READ BACK BY AND VERIFIED WITH: C HUCKBEE,RN [id number] WILDERK    Staphylococcus aureus POSITIVE (A) NEGATIVE Final     Comment:        The Xpert SA Assay (FDA approved for NASAL specimens in patients over 83 years of age), is one component of a comprehensive surveillance program.  Test performance has been validated by [facility] for patients greater than or equal to 69 year old. It is not intended to diagnose infection nor to guide or monitor treatment. RESULT CALLED TO, READ BACK BY AND VERIFIED WITHPerry Mount [id number] [place]   Tissue culture     Status: None (Preliminary result)   Collection Time: 11/03/14  1:09 PM  Result Value Ref Range Status   Specimen Description TISSUE  Final   Special Requests  RIGHT FIBULA  Final   Gram Stain PENDING  Incomplete   Culture   Final    NO GROWTH 2 DAYS Performed at [facility]    Report Status PENDING  Incomplete   Studies/Results: No results found. Medications: I have reviewed the patient's current medications. Scheduled Meds: . anastrozole  1 mg Oral QHS  . Bacitracin 50,000 units, gentamicin 80mg  & cefazolin 1 g in normal saline (1014ml) mastectomy irrigation   Irrigation Once  . baclofen  30 mg Oral BID  . Chlorhexidine Gluconate Cloth  6 each Topical Q0600  . DULoxetine  60 mg Oral QPC supper  . enoxaparin (LOVENOX) injection  40 mg Subcutaneous Q24H  . feeding supplement  1 Container Oral TID BM  . folic acid  1 mg Oral QPC supper  . gabapentin  1,600 mg Oral BID  . levothyroxine  125 mcg Oral QAC breakfast  . lidocaine  5 mL Other Once  . metoprolol  100 mg Oral QPC supper  . morphine  60 mg Oral Q12H  . multivitamin with minerals  1 tablet Oral Daily  . mupirocin ointment  1 application Nasal BID  . NIFEdipine  30 mg Oral Daily  . pantoprazole  40 mg Oral Daily  . piperacillin-tazobactam (ZOSYN)  IV  3.375 g Intravenous 3 times per day  . protein supplement  2 oz Oral QID  Or  . protein supplement  2 oz Oral QID  . sodium hypochlorite   Irrigation q12n4p  . vancomycin  750 mg Intravenous Q12H  . vitamin C  500  mg Oral Daily  . zinc sulfate  220 mg Oral Daily   Continuous Infusions: . sodium chloride    . lactated ringers     PRN Meds:.acetaminophen, loperamide, oxyCODONE-acetaminophen **AND** oxyCODONE, simethicone Assessment/Plan:  Multiple Decubitus Ulcers s/p debridement on 7/29 - Well-healing. 5x7x7cm ischial wound, 4.5x6x1cm L heal wound, 8.2x4.5x1cm R lat ankle with exposed bone (cultured). Prealbumin low at 8.2. -Appreciate plastic surgery and wound care following -Follow-up surgical wound cultures  -Follow-up blood cultures x 2  (NGTD) -Continue IV vancomycin and zosyn until cultures result, if negative discontinue antibiotics per plastic surgery  -Continue zinc 220 mg daily, vitamin C 657 mg daily, folic acid 1 mg daily, multivitamin, and protein supplement 2 oz QID, and resource boost TID for wound healing  -Continue Dakin's topical solution BID for 3 days (ending tomm) -Continue air mattress, float heels, and foam dressing -Start NS 100 mL x 10 hrs for mild increase in creatinine  -Pt may need rectal tube on discharge if diarrhea persists  -Follow-up with wound care clinic in 2 weeks -On discharge pt to have: sacrum wound foam dressing changed weekly, Left ischium with silver alginate q 2d covered with absorptive dressing. Left heel: silver alginate q 2d covered with kerlix plus heel foam, Right lateral ankle: silver alginate q 2d covered with kerlix, and BLE podus boot  Paraplegia - Pt with well-controlled pain and spasms.  -Continue gabapentin 1600 mg BID for neuropathy  -Continue MS contin 60 mg BID for pain -Continue percocet 5-325 mg Q 6 hr PRN and Oxy ID 5 mg Q 6 hr PRN pain -Continue tylenol 500 mg Q 6 hr PRN pain -Continue baclofen 30 mg BID for spasticity   Possible Dyspepsia - Pt with epigastric pain, postprandial fullness, early satiety, and gassiness.  -Obtain H pylori stool antigen  -Continue simethicone 120 mg PRN  -Increase protonix 40 mg daily to BID     Hypertension - Currently normotensive.  -Continue nifedipine 30 mg daily and metoprolol succinate 100 mg daily   Hypothyroidism - Last TSH level unknown.  -Continue synthroid 125 mcg daily  -Obtain TSH level at follow-up visit   Normocytic Anemia - Hg 8.2 with history of macrocytic anemia in the past. Anemia panel on 7/27 consistent with ACD, however etiology unclear, possible due to hypothyroidism however would produce macrocytic anemia vs MDS vs MM.   -Obtain smear review and reticulocyte count -Consider free light chains and SPEP with IFE to evaluate for MM -Obtain H pylori stool antigen  -Continue to monitor CBC -Pt needs outpatient screening colonoscopy (FOBT negative)  Depression - Currently with stable mood. -Continue cymbalta 60 mg daily   History right breast cancer -  No history of recurrence. Last mammogram Jan 2015. Followed by Susanne Borders NP ([place]). -Continue anastrozole 1 mg daily   HIV and HCV Screening - HIV negative. Pt born b/w 1947-65 with no prior HCV screening.  -Follow-up HCV Ab  Diet: Regular  DVT Ppx: Lovenox Code: Full    Dispo: Disposition is deferred at this time, awaiting improvement of current medical problems.  Anticipated discharge in approximately 2- 4day(s).   The patient does have a current PCP [name], MD) and does need an [facility] hospital follow-up appointment after discharge.  The patient does not have transportation limitations that  hinder transportation to clinic appointments.  .Services Needed at time of discharge: Y = Yes, Blank = No PT:   OT:   RN:   Equipment:   Other:     LOS: 4 days   Juluis Mire, MD 11/05/2014, 5:01 PM

## 2014-11-05 NOTE — Progress Notes (Signed)
Subjective: No complaints today. Several loose stools yesterday afternoon and O/N. Concerned about keeping sacral ulcer clean. Has a good appetite and eating well. Tolerating Unjury Protein supplements well, goal of TID/QID to increase protein intake.  Objective: Vital signs in last 24 hours: Filed Vitals:   11/04/14 0519 11/04/14 1500 11/04/14 2117 11/05/14 0604  BP: 125/58 121/72 131/43 145/48  Pulse: 78 78 80 81  Temp: 98.9 F (37.2 C) 98.5 F (36.9 C) 99.3 F (37.4 C) 98.6 F (37 C)  TempSrc: Oral Oral Oral Oral  Resp: 16 16 16 16   Height:      Weight:      SpO2: 100% 98% 99% 97%   Weight change:   Intake/Output Summary (Last 24 hours) at 11/05/14 1027 Last data filed at 11/05/14 5974  Gross per 24 hour  Intake    268 ml  Output   1350 ml  Net  -1082 ml   General appearance: alert, cooperative and appears stated age Head: Normocephalic, without obvious abnormality, atraumatic Lungs: clear to auscultation bilaterally Heart: regular rate and rhythm, S1, S2 normal, 2/6 systolic murmur Abdomen: tense at baseline, non-tender throughout except mild TTP epigastric region; bowel sounds normal Extremities: extremities normal, atraumatic, no cyanosis or edema Sacral Ulcer, Ischial Ulcer, R Ankle Ulcer, L heel Ulcer bandaged post-op (see Op note 7/29 for description)  Lab Results: Basic Metabolic Panel:  Recent Labs Lab 10/31/14 2111  11/03/14 0318 11/05/14 0455 11/05/14 0550  NA 141  < > 140 139  --   K 2.9*  < > 3.5 4.4  --   CL 94*  < > 105 108  --   CO2 33*  < > 27 23  --   GLUCOSE 101*  < > 108* 99  --   BUN 10  < > 7 11  --   CREATININE 0.84  < > 0.82 1.01*  --   CALCIUM 8.5*  < > 8.1* 8.1*  --   MG 2.0  --   --   --  2.3  PHOS 3.1  --   --   --   --   < > = values in this interval not displayed. Liver Function Tests:  Recent Labs Lab 10/31/14 2111 11/05/14 0455  AST 25 34  ALT 12* 15  ALKPHOS 54 49  BILITOT 0.8 0.6  PROT 7.1 6.6  ALBUMIN 2.0*  1.7*    Recent Labs Lab 10/31/14 2111  LIPASE 21*   CBC:  Recent Labs Lab 10/31/14 2111 11/01/14 0049  11/03/14 0318 11/05/14 0550  WBC 15.7* 13.4*  < > 10.9* 9.9  NEUTROABS 13.7* 11.4*  --   --   --   HGB 8.9* 8.8*  < > 8.6* 8.2*  HCT 27.8* 27.6*  < > 26.5* 26.0*  MCV 94.9 95.8  < > 94.6 95.6  PLT 434* 408*  < > 370 408*  < > = values in this interval not displayed. Anemia Panel:  Recent Labs Lab 11/01/14 1231 11/02/14 1153 11/02/14 1154  VITAMINB12  --  413  --   FERRITIN 1029*  --   --   TIBC  --   --  185*  IRON  --   --  20*   Urinalysis:  Recent Labs Lab 11/01/14 1127  COLORURINE YELLOW  LABSPEC 1.016  PHURINE 8.5*  GLUCOSEU NEGATIVE  HGBUR NEGATIVE  BILIRUBINUR NEGATIVE  KETONESUR NEGATIVE  PROTEINUR NEGATIVE  UROBILINOGEN 1.0  NITRITE NEGATIVE  LEUKOCYTESUR MODERATE*  Micro Results: Recent Results (from the past 240 hour(s))  Clostridium Difficile by PCR (not at [facility])     Status: None   Collection Time: 10/31/14  4:17 PM  Result Value Ref Range Status   C difficile by pcr NEGATIVE NEGATIVE Final  Culture, blood (routine x 2)     Status: None (Preliminary result)   Collection Time: 11/01/14 12:41 AM  Result Value Ref Range Status   Specimen Description BLOOD LEFT ANTECUBITAL  Final   Special Requests BOTTLES DRAWN AEROBIC AND ANAEROBIC 4CC EA  Final   Culture NO GROWTH 3 DAYS  Final   Report Status PENDING  Incomplete  Culture, blood (routine x 2)     Status: None (Preliminary result)   Collection Time: 11/01/14 12:49 AM  Result Value Ref Range Status   Specimen Description BLOOD LEFT ANTECUBITAL  Final   Special Requests BOTTLES DRAWN AEROBIC ONLY 4CC  Final   Culture NO GROWTH 3 DAYS  Final   Report Status PENDING  Incomplete  Surgical pcr screen     Status: Abnormal   Collection Time: 11/02/14  4:20 AM  Result Value Ref Range Status   MRSA, PCR POSITIVE (A) NEGATIVE Final    Comment: RESULT CALLED TO, READ BACK BY AND VERIFIED  WITH: C HUCKBEE,RN [id number] WILDERK    Staphylococcus aureus POSITIVE (A) NEGATIVE Final    Comment:        The Xpert SA Assay (FDA approved for NASAL specimens in patients over 48 years of age), is one component of a comprehensive surveillance program.  Test performance has been validated by [facility] for patients greater than or equal to 10 year old. It is not intended to diagnose infection nor to guide or monitor treatment. RESULT CALLED TO, READ BACK BY AND VERIFIED WITHPerry Mount [id number] [place]   Tissue culture     Status: None (Preliminary result)   Collection Time: 11/03/14  1:09 PM  Result Value Ref Range Status   Specimen Description TISSUE  Final   Special Requests  RIGHT FIBULA  Final   Gram Stain PENDING  Incomplete   Culture   Final    NO GROWTH 2 DAYS Performed at [facility]    Report Status PENDING  Incomplete   Studies/Results: No results found. Medications: I have reviewed the patient's current medications. Scheduled Meds: . anastrozole  1 mg Oral QHS  . Bacitracin 50,000 units, gentamicin 80mg  & cefazolin 1 g in normal saline (1061ml) mastectomy irrigation   Irrigation Once  . baclofen  30 mg Oral BID  . Chlorhexidine Gluconate Cloth  6 each Topical Q0600  . DULoxetine  60 mg Oral QPC supper  . enoxaparin (LOVENOX) injection  40 mg Subcutaneous Q24H  . feeding supplement  1 Container Oral TID BM  . folic acid  1 mg Oral QPC supper  . gabapentin  1,600 mg Oral BID  . levothyroxine  125 mcg Oral QAC breakfast  . lidocaine  5 mL Other Once  . metoprolol  100 mg Oral QPC supper  . morphine  60 mg Oral Q12H  . multivitamin with minerals  1 tablet Oral Daily  . mupirocin ointment  1 application Nasal BID  . NIFEdipine  30 mg Oral Daily  . pantoprazole  40 mg Oral Daily  . piperacillin-tazobactam (ZOSYN)  IV  3.375 g Intravenous 3 times per day  . protein supplement  2 oz Oral QID   Or  . protein supplement  2  oz Oral QID  .  sodium hypochlorite   Irrigation q12n4p  . vancomycin  750 mg Intravenous Q12H  . vitamin C  500 mg Oral Daily  . zinc sulfate  220 mg Oral Daily   Continuous Infusions: . sodium chloride    . lactated ringers     PRN Meds:.acetaminophen, loperamide, oxyCODONE-acetaminophen **AND** oxyCODONE, simethicone   Assessment/Plan: Principal Problem:   Sacral ulcer Active Problems:   HTN (hypertension)   Breast cancer of lower-inner quadrant of right female breast   Hypothyroidism   Abdominal pain   Paraplegia   Hypokalemia  Abdominal Pain/Diarrhea: Resolved, still complains of intermittent "gas" pain. Afeb. Abdomen chronically tonic 2/2 spinal cord injury, Non-tender.C. Diff neg. BCx - Negative. On Antibiotics to cover decub ulcers, may be contributing to loose stool.  - Loperamide PRN - Simethicone PRN - Protonix po 40mg  qD - Tolerating P.O. Nutrition well   Decubitus Ulcers: s/p debridement 7/29. Op note indicates 5x7x7cm ischial wound, 4.5x6x1cm L heal wound, 8.2x4.5x1cm R lat ankle with exposed bone (cultured). WBC normalized today at 9.9 (15.7 on admission). - Appreciate wound care recs:  - Air matress, float heels, foam dressing - Appreciate nutrition recs:  - Increase protein intake, tolerating Unjury protein powder supplement well (21g Prot)  - Multivit, Vit C, Zinc - Appreciate Plastics recs, post-op care:  - Continue abx until cx results, continue only if positive  - Sacrum wound: foam dressing change q week  - L ischium: silver alginate q 2d covered with absorptive dressing  - L heel: silver alginate q 2d covered with kerlix plus heel foam  - R lat ankle: silver alginate q 2d covered with kerlix  - BLE podus boot  - f/u 2 weeks @ [place] - f/u Wound Cx - NGx2d - Vanc/Zosyn per pharmacy, cont for now, will narrow vs discontinue pending wound cx tomorrow  Anemia: Hgb 8.2, down from 13 in 09/2014. Fe studies indicate chronic dz. B12/Folate WNL, FOBT neg x2.  Possibly due to recent GI bleed now resolved. H/o breast CA concerning for MDS or BM infiltration. - CBC w/ diff and smear - Retic count - Continue folate suppl - Colonoscopy as outpt - Consider d/c anticoag if Hgb drops.  HTN: stable - Nifedipine 30 mg - Metoprolol Succ 100 mg   Hypothyroid: stable - Levothyroxine 125 mcg daily  Paraplegia:  - Baclofen 20 mg BID - Gabapentin 1600 mg BID  Depression: - Cymbalta  H/o Breast Cancer: stable. Last mammogram Jan 2015. Followed by Susanne Borders NP ([place]). - Anastrozole 1 mg daily  FEN/GI - Restart 1L IVF @ 127mL/hr for 2/2 SCr bump - Recheck Bmet in AM - Albumin corrected Ca = 9.9, WNL - Reg diet + Unjury Protein supplements TID/QID  DVT Ppx: Lovenox 40mg  qD  Dispo: Disposition is deferred at this time, awaiting wound culture results from OR to dictate abx therapy. If positive, will require PICC and 6wks abx tx. If neg, plan to discontinue abx and d/c to home health with outpatient wound care followup.  Pt. notes that she would like to transfer her primary care to the IMTS here at [facility].    LOS: 4 days   Effie Berkshire, Med Student 11/05/2014, 10:27 AM

## 2014-11-05 NOTE — Plan of Care (Signed)
Problem: Phase I Progression Outcomes Goal: Initial discharge plan identified Outcome: Progressing Wanting to go back home

## 2014-11-06 ENCOUNTER — Encounter (HOSPITAL_COMMUNITY): Payer: Self-pay | Admitting: Plastic Surgery

## 2014-11-06 DIAGNOSIS — B9689 Other specified bacterial agents as the cause of diseases classified elsewhere: Secondary | ICD-10-CM

## 2014-11-06 DIAGNOSIS — M869 Osteomyelitis, unspecified: Secondary | ICD-10-CM

## 2014-11-06 DIAGNOSIS — M86161 Other acute osteomyelitis, right tibia and fibula: Secondary | ICD-10-CM

## 2014-11-06 DIAGNOSIS — L89159 Pressure ulcer of sacral region, unspecified stage: Secondary | ICD-10-CM | POA: Diagnosis present

## 2014-11-06 DIAGNOSIS — M86652 Other chronic osteomyelitis, left thigh: Secondary | ICD-10-CM | POA: Diagnosis present

## 2014-11-06 LAB — TISSUE CULTURE
CULTURE: NO GROWTH
Gram Stain: NONE SEEN

## 2014-11-06 LAB — CBC WITH DIFFERENTIAL/PLATELET
BASOS ABS: 0 10*3/uL (ref 0.0–0.1)
Basophils Relative: 0 % (ref 0–1)
EOS PCT: 1 % (ref 0–5)
Eosinophils Absolute: 0.1 10*3/uL (ref 0.0–0.7)
HCT: 24 % — ABNORMAL LOW (ref 36.0–46.0)
Hemoglobin: 7.8 g/dL — ABNORMAL LOW (ref 12.0–15.0)
LYMPHS PCT: 14 % (ref 12–46)
Lymphs Abs: 1.2 10*3/uL (ref 0.7–4.0)
MCH: 30.6 pg (ref 26.0–34.0)
MCHC: 32.5 g/dL (ref 30.0–36.0)
MCV: 94.1 fL (ref 78.0–100.0)
Monocytes Absolute: 0.6 10*3/uL (ref 0.1–1.0)
Monocytes Relative: 7 % (ref 3–12)
NEUTROS ABS: 6.7 10*3/uL (ref 1.7–7.7)
Neutrophils Relative %: 78 % — ABNORMAL HIGH (ref 43–77)
PLATELETS: 417 10*3/uL — AB (ref 150–400)
RBC: 2.55 MIL/uL — AB (ref 3.87–5.11)
RDW: 14.4 % (ref 11.5–15.5)
WBC: 8.6 10*3/uL (ref 4.0–10.5)

## 2014-11-06 LAB — RETICULOCYTES
RBC.: 2.55 MIL/uL — ABNORMAL LOW (ref 3.87–5.11)
Retic Count, Absolute: 68.9 K/uL (ref 19.0–186.0)
Retic Ct Pct: 2.7 % (ref 0.4–3.1)

## 2014-11-06 LAB — BASIC METABOLIC PANEL WITH GFR
Anion gap: 3 — ABNORMAL LOW (ref 5–15)
BUN: 11 mg/dL (ref 6–20)
CO2: 26 mmol/L (ref 22–32)
Calcium: 8.2 mg/dL — ABNORMAL LOW (ref 8.9–10.3)
Chloride: 112 mmol/L — ABNORMAL HIGH (ref 101–111)
Creatinine, Ser: 0.87 mg/dL (ref 0.44–1.00)
GFR calc Af Amer: >60 mL/min
GFR calc non Af Amer: >60 mL/min
Glucose, Bld: 96 mg/dL (ref 65–99)
Potassium: 4.3 mmol/L (ref 3.5–5.1)
Sodium: 141 mmol/L (ref 135–145)

## 2014-11-06 LAB — CULTURE, BLOOD (ROUTINE X 2)
Culture: NO GROWTH
Culture: NO GROWTH

## 2014-11-06 LAB — HCV COMMENT:

## 2014-11-06 LAB — HEPATITIS C ANTIBODY (REFLEX): HCV Ab: <0.1 {s_co_ratio} (ref 0.0–0.9)

## 2014-11-06 LAB — TECHNOLOGIST SMEAR REVIEW

## 2014-11-06 NOTE — Progress Notes (Signed)
Internal Medicine Attending  Date: 11/06/2014  Patient name: [name] Medical record number: [id number] Date of birth: [birth year] Age: 64 y.o. Gender: female  I saw and evaluated the patient. I reviewed the resident's note by Dr. Redmond Pulling and I agree with the resident's findings and plans as documented in her progress note.  Mr. Petrak feels much improved this morning when she was seen on rounds. She is interested in completing her antibiotics for her osteomyelitis at home and we are adjusting the antibiotic regimen to facilitate this. The reason we decided to treat for osteomyelitis was the fact that the bone was exposed per report and the negative cultures could be a false negative because the antibiotics were started before the biopsy was taken. Once home antibiotics can be arranged I anticipate she will be ready for discharge home.

## 2014-11-06 NOTE — Progress Notes (Signed)
PT Cancellation Note  Patient Details Name: [name] MRN: [id number] DOB: 02-May-1950   Cancelled Treatment:    Reason Eval/Treat Not Completed: Medical issues which prohibited therapy;Other (comment) (Nursing discouraged moving/shearing sacrum).  Will check tomorrow to see if any PT indicated.   Ramond Dial 11/06/2014, 2:41 PM   Mee Hives, PT MS Acute Rehab Dept. Number: [phone] [id number] and [place] [phone]

## 2014-11-06 NOTE — Progress Notes (Signed)
  Fibula bone culture negative. Would d/c broad spectrum antibiotics.  Irene Limbo, MD [facility] Plastic & Reconstructive Surgery [phone]

## 2014-11-06 NOTE — Progress Notes (Signed)
Occupational Therapy Treatment Patient Details Name: [name] MRN: [id number] DOB: 09-01-1950 Today's Date: 11/06/2014    History of present illness 64 yo female admitted with abdominal pain and sacral decubitus ulcer. PMH: paraplegia, Breast cancer, chronic pain, HTN, HLD   OT comments  Patient progressing towards OT goals, continue plan of care for now. Unable to gather accurate information as husband and patient having difficulty with time line of events. Husband and patient state that they have not been thoroughly educated on transfer training and safety (including car transfers) after paraplegia diagnosis. For patient and husband safety, both need extensive education on safety, ADL training, functional transfer training, bed mobility, pressure relief. During this OT session, OT thoroughly educated patient and husband on pressure relief techniques and the importance of strictly adhering to them. Below is education provided to patient with typed handout given to patient.   *Roll in bed every 2 hours to decrease pressure and decrease likeness of worsening pressure sore/ulcers. *Put pillows under legs so heels "float" and no pressure on heels *Pressure relief techniques (lateral leans, leaning forward, spouse manually tilting w/c back) while in w/c (every 20-30 minutes for 1-2 minutes)    Follow Up Recommendations  Home health OT;Supervision/Assistance - 24 hour (vs SNF, depending on wound care)    Equipment Recommendations  Wheelchair cushion (measurements OT);Hospital bed;Wheelchair (measurements OT)    Recommendations for Other Services Other (comment) (wound care)    Precautions / Restrictions Precautions Precautions: Fall;Other (comment) (pressure wounds) Precaution Comments: pt deep wound to L gluteal fold and large R lateral necrotic ankle wound Restrictions Weight Bearing Restrictions: No       Mobility Bed Mobility General bed mobility comments: Educated patient on  bed mobility and positioning for pressure wounds, husband present too.   Transfers General transfer comment: not attempted pt is paraplegic, pt preferred to stay in bed with air mattress due to pressure wounds         ADL Overall ADL's : At baseline General ADL Comments: Pt required assistance from husband PTA.      Cognition   Behavior During Therapy: WFL for tasks assessed/performed Overall Cognitive Status: Within Functional Limits for tasks assessed                 Pertinent Vitals/ Pain       Pain Assessment: No/denies pain   Frequency Min 2X/week     Progress Toward Goals  OT Goals(current goals can now befound in the care plan section)  Progress towards OT goals: Progressing toward goals     Plan Discharge plan remains appropriate    End of Session   Activity Tolerance Patient tolerated treatment well   Patient Left in bed;with call bell/phone within reach  Nurse Communication          Time: 0254-2706 OT Time Calculation (min): 40 min  Charges: OT General Charges $OT Visit: 1 Procedure OT Treatments $Self Care/Home Management : 38-52 mins  Maxmilian Trostel , MS, OTR/L, CLT Pager: [phone]  11/06/2014, 11:10 AM

## 2014-11-06 NOTE — Progress Notes (Signed)
ANTIBIOTIC CONSULT NOTE - FOLLOW UP  Pharmacy Consult for Vancocin and Zosyn Indication: decubitus ulcer   Labs:  Recent Labs  11/05/14 0455 11/05/14 0550 11/06/14 0433  WBC  --  9.9 8.6  HGB  --  8.2* 7.8*  PLT  --  408* 417*  CREATININE 1.01*  --  0.87   Estimated Creatinine Clearance: 62.6 mL/min (by C-G formula based on Cr of 0.87). No results for input(s): VANCOTROUGH, VANCOPEAK, VANCORANDOM, GENTTROUGH, GENTPEAK, GENTRANDOM, TOBRATROUGH, TOBRAPEAK, TOBRARND, AMIKACINPEAK, AMIKACINTROU, AMIKACIN in the last 72 hours.   Microbiology: Recent Results (from the past 720 hour(s))  Clostridium Difficile by PCR (not at [facility])     Status: None   Collection Time: 10/31/14  4:17 PM  Result Value Ref Range Status   C difficile by pcr NEGATIVE NEGATIVE Final  Culture, blood (routine x 2)     Status: None (Preliminary result)   Collection Time: 11/01/14 12:41 AM  Result Value Ref Range Status   Specimen Description BLOOD LEFT ANTECUBITAL  Final   Special Requests BOTTLES DRAWN AEROBIC AND ANAEROBIC 4CC EA  Final   Culture NO GROWTH 4 DAYS  Final   Report Status PENDING  Incomplete  Culture, blood (routine x 2)     Status: None (Preliminary result)   Collection Time: 11/01/14 12:49 AM  Result Value Ref Range Status   Specimen Description BLOOD LEFT ANTECUBITAL  Final   Special Requests BOTTLES DRAWN AEROBIC ONLY 4CC  Final   Culture NO GROWTH 4 DAYS  Final   Report Status PENDING  Incomplete  Surgical pcr screen     Status: Abnormal   Collection Time: 11/02/14  4:20 AM  Result Value Ref Range Status   MRSA, PCR POSITIVE (A) NEGATIVE Final    Comment: RESULT CALLED TO, READ BACK BY AND VERIFIED WITH: C HUCKBEE,RN [id number] WILDERK    Staphylococcus aureus POSITIVE (A) NEGATIVE Final    Comment:        The Xpert SA Assay (FDA approved for NASAL specimens in patients over 39 years of age), is one component of a comprehensive surveillance program.  Test performance  has been validated by [facility] for patients greater than or equal to 25 year old. It is not intended to diagnose infection nor to guide or monitor treatment. RESULT CALLED TO, READ BACK BY AND VERIFIED WITHPerry Mount [id number] [facility]   Tissue culture     Status: None   Collection Time: 11/03/14  1:09 PM  Result Value Ref Range Status   Specimen Description TISSUE  Final   Special Requests  RIGHT FIBULA  Final   Gram Stain   Final    NO WBC SEEN NO ORGANISMS SEEN Performed at [facility]    Culture   Final    NO GROWTH 3 DAYS Performed at [facility]    Report Status 11/06/2014 FINAL  Final     Assessment:  64 yof continuing on vanc/zosyn day #6 of abx for decub ulcer - debridement in OR 7/29. Afebrile, WBC wnl, CRP 28.3, sed rate 135. HIV neg. To consider D/c abx today pending cultures  7/27 BCx x2 > ngtd 7/26 C.diff > negative 7/28 MRSA PCR > positive 7/29: tissue (fibula)>> ngf  Vanc 7/27 >>    7/29 VT = 14 (True tough probably closer to 13 as last dose given late) Zosyn 7/27 >>  Plan: Continue Vanc 750mg  IV Q12H Continue Zosyn 3.375gm IV Q8H, 4 hr infusion Monitor clinical picture,  renal function, VT prn F/U C&S, abx deescalation / LOT MD planning on narrowing or discontinuing 8/1 pending culture results   Elicia Lamp, PharmD Clinical Pharmacist Pager [phone] 11/06/2014 11:38 AM

## 2014-11-06 NOTE — Progress Notes (Signed)
Subjective: No complaints today. 1 stool yesterday. Continues to have a good appetite and eating well with protein supplements. Continues to have mild epigastric pain, and she thinks this may be due to muscle spasm.  Objective: Vital signs in last 24 hours: Filed Vitals:   11/05/14 0604 11/05/14 1410 11/05/14 2149 11/06/14 0446  BP: 145/48 134/52 135/57 152/56  Pulse: 81 82 81 80  Temp: 98.6 F (37 C) 98 F (36.7 C) 99.7 F (37.6 C) 98.5 F (36.9 C)  TempSrc: Oral Oral Oral Oral  Resp: 16 18 19 18   Height:      Weight:      SpO2: 97% 99% 99% 97%   Weight change:   Intake/Output Summary (Last 24 hours) at 11/06/14 1326 Last data filed at 11/06/14 2992  Gross per 24 hour  Intake   1374 ml  Output   2050 ml  Net   -676 ml   General appearance: alert, cooperative and appears stated age Head: Normocephalic, without obvious abnormality, atraumatic Lungs: clear to auscultation bilaterally Heart: regular rate and rhythm, S1, S2 normal, 2/6 systolic murmur Abdomen: tense at baseline, non-tender throughout except mild TTP epigastric region; bowel sounds normal Extremities: extremities normal, atraumatic, no cyanosis or edema Sacral Ulcer, Ischial Ulcer, R Ankle Ulcer, L heel Ulcer bandaged (see Op note 7/29 for description)  Lab Results: Basic Metabolic Panel:  Recent Labs Lab 10/31/14 2111  11/05/14 0455 11/05/14 0550 11/06/14 0433  NA 141  < > 139  --  141  K 2.9*  < > 4.4  --  4.3  CL 94*  < > 108  --  112*  CO2 33*  < > 23  --  26  GLUCOSE 101*  < > 99  --  96  BUN 10  < > 11  --  11  CREATININE 0.84  < > 1.01*  --  0.87  CALCIUM 8.5*  < > 8.1*  --  8.2*  MG 2.0  --   --  2.3  --   PHOS 3.1  --   --   --   --   < > = values in this interval not displayed. Liver Function Tests:  Recent Labs Lab 10/31/14 2111 11/05/14 0455  AST 25 34  ALT 12* 15  ALKPHOS 54 49  BILITOT 0.8 0.6  PROT 7.1 6.6  ALBUMIN 2.0* 1.7*    Recent Labs Lab 10/31/14 2111    LIPASE 21*   CBC:  Recent Labs Lab 11/01/14 0049  11/05/14 0550 11/06/14 0433  WBC 13.4*  < > 9.9 8.6  NEUTROABS 11.4*  --   --  6.7  HGB 8.8*  < > 8.2* 7.8*  HCT 27.6*  < > 26.0* 24.0*  MCV 95.8  < > 95.6 94.1  PLT 408*  < > 408* 417*  < > = values in this interval not displayed. Anemia Panel:  Recent Labs Lab 11/01/14 1231 11/02/14 1153 11/02/14 1154 11/06/14 0433  VITAMINB12  --  413  --   --   FERRITIN 1029*  --   --   --   TIBC  --   --  185*  --   IRON  --   --  20*  --   RETICCTPCT  --   --   --  2.7   Urinalysis:  Recent Labs Lab 11/01/14 [place] 1.016  PHURINE 8.5*  [place] NEGATIVE  BILIRUBINUR NEGATIVE  KETONESUR  NEGATIVE  PROTEINUR NEGATIVE  UROBILINOGEN 1.0  NITRITE NEGATIVE  LEUKOCYTESUR MODERATE*   Micro Results: Recent Results (from the past 240 hour(s))  Clostridium Difficile by PCR (not at [facility])     Status: None   Collection Time: 10/31/14  4:17 PM  Result Value Ref Range Status   C difficile by pcr NEGATIVE NEGATIVE Final  Culture, blood (routine x 2)     Status: None (Preliminary result)   Collection Time: 11/01/14 12:41 AM  Result Value Ref Range Status   Specimen Description BLOOD LEFT ANTECUBITAL  Final   Special Requests BOTTLES DRAWN AEROBIC AND ANAEROBIC 4CC EA  Final   Culture NO GROWTH 4 DAYS  Final   Report Status PENDING  Incomplete  Culture, blood (routine x 2)     Status: None (Preliminary result)   Collection Time: 11/01/14 12:49 AM  Result Value Ref Range Status   Specimen Description BLOOD LEFT ANTECUBITAL  Final   Special Requests BOTTLES DRAWN AEROBIC ONLY 4CC  Final   Culture NO GROWTH 4 DAYS  Final   Report Status PENDING  Incomplete  Surgical pcr screen     Status: Abnormal   Collection Time: 11/02/14  4:20 AM  Result Value Ref Range Status   MRSA, PCR POSITIVE (A) NEGATIVE Final    Comment: RESULT CALLED TO, READ BACK BY AND VERIFIED WITH: C HUCKBEE,RN [id number]  0734 WILDERK    Staphylococcus aureus POSITIVE (A) NEGATIVE Final    Comment:        The Xpert SA Assay (FDA approved for NASAL specimens in patients over 33 years of age), is one component of a comprehensive surveillance program.  Test performance has been validated by [facility] for patients greater than or equal to 83 year old. It is not intended to diagnose infection nor to guide or monitor treatment. RESULT CALLED TO, READ BACK BY AND VERIFIED WITHPerry Mount [id number] [facility]   Tissue culture     Status: None   Collection Time: 11/03/14  1:09 PM  Result Value Ref Range Status   Specimen Description TISSUE  Final   Special Requests  RIGHT FIBULA  Final   Gram Stain   Final    NO WBC SEEN NO ORGANISMS SEEN Performed at [facility]    Culture   Final    NO GROWTH 3 DAYS Performed at [facility]    Report Status 11/06/2014 FINAL  Final   Studies/Results: No results found. Medications: I have reviewed the patient's current medications. Scheduled Meds: . anastrozole  1 mg Oral QHS  . Bacitracin 50,000 units, gentamicin 80mg  & cefazolin 1 g in normal saline (1036ml) mastectomy irrigation   Irrigation Once  . baclofen  30 mg Oral BID  . DULoxetine  60 mg Oral QPC supper  . enoxaparin (LOVENOX) injection  40 mg Subcutaneous Q24H  . feeding supplement  1 Container Oral TID BM  . folic acid  1 mg Oral QPC supper  . gabapentin  1,600 mg Oral BID  . levothyroxine  125 mcg Oral QAC breakfast  . lidocaine  5 mL Other Once  . metoprolol  100 mg Oral QPC supper  . morphine  60 mg Oral Q12H  . multivitamin with minerals  1 tablet Oral Daily  . mupirocin ointment  1 application Nasal BID  . NIFEdipine  30 mg Oral Daily  . pantoprazole  40 mg Oral BID  . piperacillin-tazobactam (ZOSYN)  IV  3.375 g Intravenous 3 times per  day  . protein supplement  2 oz Oral QID   Or  . protein supplement  2 oz Oral QID  . sodium hypochlorite   Irrigation q12n4p  .  vancomycin  750 mg Intravenous Q12H  . vitamin C  500 mg Oral Daily  . zinc sulfate  220 mg Oral Daily   Continuous Infusions: . lactated ringers     PRN Meds:.acetaminophen, loperamide, oxyCODONE-acetaminophen **AND** oxyCODONE, simethicone   Assessment/Plan: Principal Problem:   Sacral ulcer Active Problems:   HTN (hypertension)   Breast cancer of lower-inner quadrant of right female breast   Hypothyroidism   Abdominal pain   Paraplegia   Hypokalemia  Abdominal Pain/Diarrhea: Resolved, still complains of intermittent "gas" pain. Afeb. Abdomen chronically tonic 2/2 spinal cord injury, Non-tender.C. Diff neg. BCx - Negative. On Antibiotics to cover decub ulcers, may be contributing to loose stool.  - Loperamide PRN - Simethicone PRN - Protonix po 40mg  qD - Tolerating P.O. Nutrition well   Decubitus Ulcers: s/p debridement 7/29. Op note indicates 5x7x7cm ischial wound, 4.5x6x1cm L heal wound, 8.2x4.5x1cm R lat ankle with exposed bone (culture neg in the setting of prior abx therapy). WBC normalized. - Appreciate wound care recs:  - Air matress, float heels, foam dressing - Appreciate nutrition recs:  - Increase protein intake, tolerating Unjury protein powder supplement well (21g Prot)  - Multivit, Vit C, Zinc - Appreciate Plastics recs, post-op care:  - Sacrum wound: foam dressing change q week  - L ischium: silver alginate q 2d covered with absorptive dressing  - L heel: silver alginate q 2d covered with kerlix plus heel foam  - R lat ankle: silver alginate q 2d covered with kerlix  - BLE podus boot  - f/u 2 weeks @ [place] - Wound Cx - neg, BCx - neg - Vanc/Zosyn per pharmacy, cont for now, in the setting of exposed bone R ankle and cx after 2 days abx therapy, with continue to treat osteo empirically  -- PICC today  -- Plan Vanc/Ceftazidime home infusion x 6 wks (to avoid 4 hour infusion of with Zosyn) -- Care Management consult for home health wound care and  home infusion  Anemia: Hgb downtrending, currently 7.8, down from 13 in 09/2014. Fe studies indicate chronic dz. B12/Folate WNL, FOBT neg x2. Possibly due to recent GI bleed now resolved. Hypothyroidism appears well controlled. Possible MDS. H/o breast CA concerning for BM infiltration. Retic Index low at 0.8, indicating hypoproliferation. Polychromic smear without teardrop cells. - Continue to monitor CBC - Check SPEP to r/o MM - Continue folate suppl - Colonoscopy as outpt - Consider d/c anticoag if Hgb drops.  Dyspepsia: Chronic complaint x 3 months. Possible GERD vs H. pylori vs muscle spasm. - Continue PPI as above - f/u H. pylori stool test  HTN: stable - Nifedipine 30 mg - Metoprolol Succ 100 mg   Hypothyroid: stable - Levothyroxine 125 mcg daily  Paraplegia:  - Continue pain control with MS contin, Percocet, Tylenol PRN - Baclofen 20 mg BID - Gabapentin 1600 mg BID  Depression: - Cymbalta  H/o Breast Cancer: stable. Last mammogram Jan 2015. Followed by Susanne Borders NP ([place]). - Anastrozole 1 mg daily  FEN/GI - PO hydration - Recheck Bmet in AM to follow SCr and K - Albumin corrected Ca WNL - Reg diet + Unjury Protein supplements TID/QID  DVT Ppx: Lovenox 40mg  qD  Dispo: Disposition is deferred at this time, awaiting wound culture results from OR.  Will likely require PICC and 6wks abx tx. Plan to DC home with home health for wound care and home abx infusion. Pt. notes that she would like to transfer her primary care to the IMTS here at [facility].    LOS: 5 days   Effie Berkshire, Med Student 11/06/2014, 1:26 PM

## 2014-11-06 NOTE — Care Management Note (Signed)
Case Management Note  Patient Details  Name: [name] MRN: [id number] Date of Birth: 09/06/1950  Subjective/Objective:                    Action/Plan: Discussed home health with patient and her husband [name]) at bedside . Patient had [facility] prior to admission , however, they would like to change home health agencies . Provided a list of home health agencies , they would like [facility] ( wound care and IV ABX patient has had home IV ABX in the past) , [facility] and [facility].   Patient currently has a hospital bed through [place] .  Needs low air loss mattress .   Patient has a Chief Strategy Officer wheelchair , would like an Clinical research associate , and wheelchair cushion .   Have asked Dr Redmond Pulling for orders for above .   Plastics also recommending Podus boot , asked Dr Redmond Pulling for order for same , supplied by ortho tech , Bedside nurse Tarra aware .  Referral given to [place]   Expected Discharge Date:                  Expected Discharge Plan:  [place]  In-House Referral:     Discharge planning Services  CM Consult  Post Acute Care Choice:    Choice offered to:  Patient  DME Arranged:  Specialty mattress, Media planner DME Agency:  [place] Arranged:  RN, PT, OT [facility] Agency:  [place]  Status of Service:  In process, will continue to follow  Medicare Important Message Given:    Date Medicare IM Given:    Medicare IM give by:    Date Additional Medicare IM Given:    Additional Medicare Important Message give by:     If discussed at [place] of Stay Meetings, dates discussed:    Additional Comments:  Marilu Favre, RN 11/06/2014, 3:27 PM

## 2014-11-06 NOTE — Progress Notes (Signed)
Orthopedic Tech Progress Note Patient Details:  [name] January 03, 1951 [id number] Applied bilateral Podus boots.  Bilateral pulses, motion, sensation intact before and after application.  Bilateral capillary refill less than 2 seconds before and after application. Ortho Devices Type of Ortho Device: Postop shoe/boot Ortho Device/Splint Location: bilateral Ortho Device/Splint Interventions: Application   Darrol Poke 11/06/2014, 9:17 PM

## 2014-11-06 NOTE — Progress Notes (Signed)
Subjective: [name] was seen and examined this AM.  She says she had been experiencing gas but she is feeling better.  No F/C, N/V.  She was getting ready to eat breakfast when I saw her.   Objective: Vital signs in last 24 hours: Filed Vitals:   11/05/14 1410 11/05/14 2149 11/06/14 0446 11/06/14 1427  BP: 134/52 135/57 152/56 155/61  Pulse: 82 81 80 84  Temp: 98 F (36.7 C) 99.7 F (37.6 C) 98.5 F (36.9 C) 97.8 F (36.6 C)  TempSrc: Oral Oral Oral Oral  Resp: 18 19 18 18   Height:      Weight:      SpO2: 99% 99% 97% 99%   Weight change:   Intake/Output Summary (Last 24 hours) at 11/06/14 1747 Last data filed at 11/06/14 1427  Gross per 24 hour  Intake   1496 ml  Output   2150 ml  Net   -654 ml   General: resting in bed in NAD HEENT: [place]/AT Cardiac: RRR, no rubs, murmurs or gallops Pulm: clear to auscultation bilaterally, moving normal volumes of air Abd: BS present Ext: warm and well perfused, no pedal edema, feet wrapped, 2+ DPs B/L Neuro: alert and oriented X3,  Responding appropriately  Lab Results: Basic Metabolic Panel:  Recent Labs Lab 10/31/14 2111  11/05/14 0455 11/05/14 0550 11/06/14 0433  NA 141  < > 139  --  141  K 2.9*  < > 4.4  --  4.3  CL 94*  < > 108  --  112*  CO2 33*  < > 23  --  26  GLUCOSE 101*  < > 99  --  96  BUN 10  < > 11  --  11  CREATININE 0.84  < > 1.01*  --  0.87  CALCIUM 8.5*  < > 8.1*  --  8.2*  MG 2.0  --   --  2.3  --   PHOS 3.1  --   --   --   --   < > = values in this interval not displayed.  CBC:  Recent Labs Lab 11/01/14 0049  11/05/14 0550 11/06/14 0433  WBC 13.4*  < > 9.9 8.6  NEUTROABS 11.4*  --   --  6.7  HGB 8.8*  < > 8.2* 7.8*  HCT 27.6*  < > 26.0* 24.0*  MCV 95.8  < > 95.6 94.1  PLT 408*  < > 408* 417*  < > = values in this interval not displayed. Anemia Panel:  Recent Labs Lab 11/01/14 1231 11/02/14 1153 11/02/14 1154 11/06/14 0433  VITAMINB12  --  413  --   --   FERRITIN 1029*  --   --    --   TIBC  --   --  185*  --   IRON  --   --  20*  --   RETICCTPCT  --   --   --  2.7    Micro Results: tissue culture, right fibula - no growth, final  Medications: I have reviewed the patient's current medications. Scheduled Meds: . anastrozole  1 mg Oral QHS  . Bacitracin 50,000 units, gentamicin 80mg  & cefazolin 1 g in normal saline (1016ml) mastectomy irrigation   Irrigation Once  . baclofen  30 mg Oral BID  . DULoxetine  60 mg Oral QPC supper  . enoxaparin (LOVENOX) injection  40 mg Subcutaneous Q24H  . feeding supplement  1 Container Oral TID BM  . folic acid  1 mg Oral QPC supper  . gabapentin  1,600 mg Oral BID  . levothyroxine  125 mcg Oral QAC breakfast  . lidocaine  5 mL Other Once  . metoprolol  100 mg Oral QPC supper  . morphine  60 mg Oral Q12H  . multivitamin with minerals  1 tablet Oral Daily  . mupirocin ointment  1 application Nasal BID  . NIFEdipine  30 mg Oral Daily  . pantoprazole  40 mg Oral BID  . piperacillin-tazobactam (ZOSYN)  IV  3.375 g Intravenous 3 times per day  . protein supplement  2 oz Oral QID   Or  . protein supplement  2 oz Oral QID  . vancomycin  750 mg Intravenous Q12H  . vitamin C  500 mg Oral Daily  . zinc sulfate  220 mg Oral Daily   Continuous Infusions: . lactated ringers     PRN Meds:.acetaminophen, loperamide, oxyCODONE-acetaminophen **AND** oxyCODONE, simethicone   Assessment/Plan: Osteomyelitis:  Negative tissue cx but this was taken after the patient was started on abx so may be false neg.  Furthermore, per op note, bone was exposed so will treat with 6 wks of abx for osteo. - continue vanc, change zosyn to ceftazidime (becuase zosyn is a long 4 hour infusion) x 6 weeks - PICC ordered  - [facility] health [facility] placed for [facility] RN for abx teaching and wound care - continue to trend fever curve and WBC  Sacral ulcer:  Appreciate plastic surgery and wound care assistance with this patient.  Patient is s/p debridemend on 07/29. -  [place] orders placed for wound care - she will follow-up at wound center in 2 weeks - air mattress ordered for use at home  Paralegia:  Continue home medications.  She is comfortable returning home and husband is primary caretaker.  Anemia:  Hgb 13 in Oct of last year but has been in the 8s this admission and down to 7.8 this AM.  No gross blood loss noted.  FOBT neg.  Kidney function and calcium normal but will check SPEP to evaluate for MM.  HTN:  Stable on home meds, metoprolol, procardia  Abd pain/diarrhea:  Resolved.  Cdiff neg.  She is occasionally having gas but appetite good, no N/V.  Dispo: Disposition is deferred at this time, awaiting improvement of current medical problems.  Anticipated discharge in approximately 1-2 day(s).   The patient does have a current PCP [name], MD) and does not need an [facility] hospital follow-up appointment after discharge.  The patient does not know have transportation limitations that hinder transportation to clinic appointments.  .Services Needed at time of discharge: Y = Yes, Blank = No PT:   OT:   RN:   Equipment:   Other:     LOS: 5 days   Francesca Oman, DO 11/06/2014, 5:47 PM

## 2014-11-07 ENCOUNTER — Encounter (HOSPITAL_COMMUNITY): Payer: Self-pay | Admitting: Internal Medicine

## 2014-11-07 LAB — PROTEIN ELECTROPHORESIS, SERUM
A/G RATIO SPE: 0.5 — AB (ref 0.7–1.7)
ALPHA-1-GLOBULIN: 0.4 g/dL (ref 0.0–0.4)
Albumin ELP: 2.2 g/dL — ABNORMAL LOW (ref 2.9–4.4)
Alpha-2-Globulin: 1 g/dL (ref 0.4–1.0)
Beta Globulin: 1.3 g/dL (ref 0.7–1.3)
GAMMA GLOBULIN: 2.2 g/dL — AB (ref 0.4–1.8)
Globulin, Total: 4.8 g/dL — ABNORMAL HIGH (ref 2.2–3.9)
Total Protein ELP: 7 g/dL (ref 6.0–8.5)

## 2014-11-07 LAB — BASIC METABOLIC PANEL
Anion gap: 6 (ref 5–15)
BUN: 10 mg/dL (ref 6–20)
CALCIUM: 8.5 mg/dL — AB (ref 8.9–10.3)
CHLORIDE: 109 mmol/L (ref 101–111)
CO2: 25 mmol/L (ref 22–32)
Creatinine, Ser: 0.86 mg/dL (ref 0.44–1.00)
GFR calc Af Amer: >60 mL/min (ref >60)
Glucose, Bld: 89 mg/dL (ref 65–99)
Potassium: 4.2 mmol/L (ref 3.5–5.1)
SODIUM: 140 mmol/L (ref 135–145)

## 2014-11-07 LAB — CBC
HEMATOCRIT: 25.2 % — AB (ref 36.0–46.0)
HEMOGLOBIN: 8 g/dL — AB (ref 12.0–15.0)
MCH: 29.4 pg (ref 26.0–34.0)
MCHC: 31.7 g/dL (ref 30.0–36.0)
MCV: 92.6 fL (ref 78.0–100.0)
Platelets: 464 10*3/uL — ABNORMAL HIGH (ref 150–400)
RBC: 2.72 MIL/uL — ABNORMAL LOW (ref 3.87–5.11)
RDW: 14.4 % (ref 11.5–15.5)
WBC: 9.1 10*3/uL (ref 4.0–10.5)

## 2014-11-07 LAB — VANCOMYCIN, TROUGH: Vancomycin Tr: 19 ug/mL (ref 10.0–20.0)

## 2014-11-07 MED ORDER — DEXTROSE 5 % IV SOLN: 2.0000 g | Freq: Three times a day (TID) | INTRAVENOUS | Status: DC

## 2014-11-07 MED ORDER — PANTOPRAZOLE SODIUM 40 MG PO TBEC: 40.0000 mg | DELAYED_RELEASE_TABLET | Freq: Every day | ORAL | Status: DC

## 2014-11-07 MED ORDER — ZINC SULFATE 220 (50 ZN) MG PO CAPS: 220.0000 mg | ORAL_CAPSULE | Freq: Every day | ORAL | Status: DC

## 2014-11-07 MED ORDER — SODIUM CHLORIDE 0.9 % IJ SOLN
10.0000 mL | INTRAMUSCULAR | Status: DC | PRN
  Administered 2014-11-08: 10 mL
  Filled 2014-11-07: qty 40

## 2014-11-07 MED ORDER — ADULT MULTIVITAMIN W/MINERALS CH: 1.0000 | ORAL_TABLET | Freq: Every day | ORAL | Status: DC

## 2014-11-07 MED ORDER — VANCOMYCIN HCL IN DEXTROSE 750-5 MG/150ML-% IV SOLN: 750.0000 mg | Freq: Two times a day (BID) | INTRAVENOUS | Status: AC

## 2014-11-07 MED ORDER — ASCORBIC ACID 500 MG PO TABS: 500.0000 mg | ORAL_TABLET | Freq: Every day | ORAL | Status: DC

## 2014-11-07 MED ORDER — CEFTAZIDIME 2 G IJ SOLR
2.0000 g | Freq: Three times a day (TID) | INTRAMUSCULAR | Status: DC
Start: 2014-11-07 — End: 2014-11-08
  Administered 2014-11-07 – 2014-11-08 (×4): 2 g via INTRAVENOUS
  Filled 2014-11-07 (×6): qty 2

## 2014-11-07 MED ORDER — LEVOTHYROXINE SODIUM 125 MCG PO TABS: 125.0000 ug | ORAL_TABLET | Freq: Every day | ORAL | Status: DC

## 2014-11-07 NOTE — Care Management (Signed)
Requested order for home health aide . Advanced aware. Magdalen Spatz RN BSN  [phone]

## 2014-11-07 NOTE — Progress Notes (Signed)
Subjective: No complaints today. 1 stool yesterday. Had just finished eating breakfast. Ready to go home.  Objective: Vital signs in last 24 hours: Filed Vitals:   11/06/14 0446 11/06/14 1427 11/06/14 2149 11/07/14 0543  BP: 152/56 155/61 135/54 123/54  Pulse: 80 84 83 79  Temp: 98.5 F (36.9 C) 97.8 F (36.6 C) 98.9 F (37.2 C) 98.5 F (36.9 C)  TempSrc: Oral Oral Oral Oral  Resp: 18 18 16 18   Height:      Weight:      SpO2: 97% 99% 100% 100%   Weight change:   Intake/Output Summary (Last 24 hours) at 11/07/14 1542 Last data filed at 11/07/14 0522  Gross per 24 hour  Intake    240 ml  Output   1875 ml  Net  -1635 ml   General appearance: alert, cooperative and appears stated age Head: Normocephalic, without obvious abnormality, atraumatic Lungs: clear to auscultation bilaterally Heart: regular rate and rhythm, S1, S2 normal, 2/6 systolic murmur Abdomen: tense at baseline, non-tender throughout except mild TTP epigastric region; bowel sounds normal Extremities: extremities normal, atraumatic, no cyanosis or edema Sacral Ulcer, Ischial Ulcer, R Ankle Ulcer, L heel Ulcer bandaged (see Op note 7/29 for description)  Lab Results: Basic Metabolic Panel:  Recent Labs Lab 10/31/14 2111  11/05/14 0550 11/06/14 0433 11/07/14 0306  NA 141  < >  --  141 140  K 2.9*  < >  --  4.3 4.2  CL 94*  < >  --  112* 109  CO2 33*  < >  --  26 25  GLUCOSE 101*  < >  --  96 89  BUN 10  < >  --  11 10  CREATININE 0.84  < >  --  0.87 0.86  CALCIUM 8.5*  < >  --  8.2* 8.5*  MG 2.0  --  2.3  --   --   PHOS 3.1  --   --   --   --   < > = values in this interval not displayed. Liver Function Tests:  Recent Labs Lab 10/31/14 2111 11/05/14 0455  AST 25 34  ALT 12* 15  ALKPHOS 54 49  BILITOT 0.8 0.6  PROT 7.1 6.6  ALBUMIN 2.0* 1.7*    Recent Labs Lab 10/31/14 2111  LIPASE 21*   CBC:  Recent Labs Lab 11/01/14 0049  11/06/14 0433 11/07/14 0306  WBC 13.4*  < > 8.6  9.1  NEUTROABS 11.4*  --  6.7  --   HGB 8.8*  < > 7.8* 8.0*  HCT 27.6*  < > 24.0* 25.2*  MCV 95.8  < > 94.1 92.6  PLT 408*  < > 417* 464*  < > = values in this interval not displayed. Anemia Panel:  Recent Labs Lab 11/01/14 1231 11/02/14 1153 11/02/14 1154 11/06/14 0433  VITAMINB12  --  413  --   --   FERRITIN 1029*  --   --   --   TIBC  --   --  185*  --   IRON  --   --  20*  --   RETICCTPCT  --   --   --  2.7   Urinalysis:  Recent Labs Lab 11/01/14 1127  COLORURINE YELLOW  LABSPEC 1.016  PHURINE 8.5*  GLUCOSEU NEGATIVE  HGBUR NEGATIVE  BILIRUBINUR NEGATIVE  KETONESUR NEGATIVE  PROTEINUR NEGATIVE  UROBILINOGEN 1.0  NITRITE NEGATIVE  LEUKOCYTESUR MODERATE*   Micro Results: Recent Results (from  the past 240 hour(s))  Clostridium Difficile by PCR (not at [facility])     Status: None   Collection Time: 10/31/14  4:17 PM  Result Value Ref Range Status   Toxigenic C Difficile by pcr NEGATIVE NEGATIVE Final  Culture, blood (routine x 2)     Status: None   Collection Time: 11/01/14 12:41 AM  Result Value Ref Range Status   Specimen Description BLOOD LEFT ANTECUBITAL  Final   Special Requests BOTTLES DRAWN AEROBIC AND ANAEROBIC 4CC EA  Final   Culture NO GROWTH 5 DAYS  Final   Report Status 11/06/2014 FINAL  Final  Culture, blood (routine x 2)     Status: None   Collection Time: 11/01/14 12:49 AM  Result Value Ref Range Status   Specimen Description BLOOD LEFT ANTECUBITAL  Final   Special Requests BOTTLES DRAWN AEROBIC ONLY 4CC  Final   Culture NO GROWTH 5 DAYS  Final   Report Status 11/06/2014 FINAL  Final  Surgical pcr screen     Status: Abnormal   Collection Time: 11/02/14  4:20 AM  Result Value Ref Range Status   MRSA, PCR POSITIVE (A) NEGATIVE Final    Comment: RESULT CALLED TO, READ BACK BY AND VERIFIED WITH: C HUCKBEE,RN [id number] [facility]    Staphylococcus aureus POSITIVE (A) NEGATIVE Final    Comment:        The Xpert SA Assay (FDA approved for NASAL  specimens in patients over 75 years of age), is one component of a comprehensive surveillance program.  Test performance has been validated by [facility] for patients greater than or equal to 17 year old. It is not intended to diagnose infection nor to guide or monitor treatment. RESULT CALLED TO, READ BACK BY AND VERIFIED WITHPerry Mount [id number] [facility]   Tissue culture     Status: None   Collection Time: 11/03/14  1:09 PM  Result Value Ref Range Status   Specimen Description TISSUE  Final   Special Requests  RIGHT FIBULA  Final   Gram Stain   Final    NO WBC SEEN NO ORGANISMS SEEN Performed at [facility]    Culture   Final    NO GROWTH 3 DAYS Performed at [facility]    Report Status 11/06/2014 FINAL  Final   Studies/Results: No results found. Medications: I have reviewed the patient's current medications. Scheduled Meds: . anastrozole  1 mg Oral QHS  . Bacitracin 50,000 units, gentamicin 80mg  & cefazolin 1 g in normal saline (1059ml) mastectomy irrigation   Irrigation Once  . baclofen  30 mg Oral BID  . cefTAZidime (FORTAZ)  IV  2 g Intravenous 3 times per day  . DULoxetine  60 mg Oral QPC supper  . enoxaparin (LOVENOX) injection  40 mg Subcutaneous Q24H  . feeding supplement  1 Container Oral TID BM  . folic acid  1 mg Oral QPC supper  . gabapentin  1,600 mg Oral BID  . levothyroxine  125 mcg Oral QAC breakfast  . lidocaine  5 mL Other Once  . metoprolol  100 mg Oral QPC supper  . morphine  60 mg Oral Q12H  . multivitamin with minerals  1 tablet Oral Daily  . NIFEdipine  30 mg Oral Daily  . pantoprazole  40 mg Oral BID  . protein supplement  2 oz Oral QID   Or  . protein supplement  2 oz Oral QID  . vancomycin  750 mg Intravenous Q12H  .  vitamin C  500 mg Oral Daily  . zinc sulfate  220 mg Oral Daily   Continuous Infusions: . lactated ringers     PRN Meds:.acetaminophen, loperamide, oxyCODONE-acetaminophen **AND** oxyCODONE,  simethicone, sodium chloride   Assessment/Plan: Principal Problem:   Osteomyelitis Active Problems:   HTN (hypertension)   Breast cancer of lower-inner quadrant of right female breast   Sacral ulcer   Hypothyroidism   Paraplegia   Sacral decubitus ulcer  Abdominal Pain/Diarrhea: Resolved, still complains of intermittent "gas" pain. Afeb. Abdomen chronically tonic 2/2 spinal cord injury, Non-tender.C. Diff neg. BCx - Negative. On Antibiotics to cover decub ulcers, may be contributing to loose stool.  - Loperamide PRN - Simethicone PRN - Protonix po 40mg  qD - Tolerating P.O. Nutrition well   Decubitus Ulcers: s/p debridement 7/29. Op note indicates 5x7x7cm ischial wound, 4.5x6x1cm L heal wound, 8.2x4.5x1cm R lat ankle with exposed bone (culture neg in the setting of prior abx therapy). WBC normalized. - Appreciate wound care recs:  - Air matress, float heels, foam dressing - Appreciate nutrition recs:  - Increase protein intake, tolerating Unjury protein powder supplement well (21g Prot)  - Multivit, Vit C, Zinc - Appreciate Plastics recs, post-op care:  - Sacrum wound: foam dressing change q week  - L ischium: silver alginate q 2d covered with absorptive dressing  - L heel: silver alginate q 2d covered with kerlix plus heel foam  - R lat ankle: silver alginate q 2d covered with kerlix  - BLE podus boot  - f/u 2 weeks @ [place] - Wound Cx - neg, BCx - neg - Change Zosyn to Ceftazidime + Vanc to begin home regimen.  -- Plan to continue abx empirically in the setting of exposed bone R ankle  -- PICC today  -- Plan Vanc/Ceftazidime home infusion x 6 wks -- Care Management consult for home health wound care and home infusion  Anemia: Hgb appears stable, currently 8.0, down from 13 in 09/2014. Fe studies indicate chronic dz. B12/Folate WNL, FOBT neg x2. Possibly due to recent GI bleed now resolved. Hypothyroidism appears well controlled. Possible MDS. H/o breast CA  concerning for BM infiltration. Retic Index low at 0.8, indicating hypoproliferation. Polychromic smear without teardrop cells. - Continue to monitor CBC at outpt follow-up - f/u SPEP to r/o MM - Continue folate suppl - Colonoscopy as outpt  Dyspepsia: Chronic complaint x 3 months. Possible GERD vs H. pylori vs muscle spasm. - Continue PPI as above - f/u H. pylori stool test  HTN: stable - Nifedipine 30 mg - Metoprolol Succ 100 mg   Hypothyroid: stable - Levothyroxine 125 mcg daily  Paraplegia:  - Continue pain control with MS contin, Percocet, Tylenol PRN - Baclofen 20 mg BID - Gabapentin 1600 mg BID  Depression: - Cymbalta  H/o Breast Cancer: stable. Last mammogram Jan 2015. Followed by Susanne Borders NP ([facility]). - Anastrozole 1 mg daily  FEN/GI - PO hydration - Recheck Bmet in AM to follow SCr and K - Albumin corrected Ca WNL - Reg diet + Unjury Protein supplements TID/QID  DVT Ppx: Lovenox 40mg  qD  Dispo: Discharge to home today. Pt. notes that she would like to transfer her primary care to the IMTS here at [facility].    LOS: 6 days   Effie Berkshire, Med Student 11/07/2014, 3:42 PM

## 2014-11-07 NOTE — Progress Notes (Signed)
Internal Medicine Attending  Date: 11/07/2014  Patient name: [name] Medical record number: [id number] Date of birth: 09-Jan-1951 Age: 64 y.o. Gender: female  I saw and evaluated the patient. I reviewed the resident's note by Dr. Redmond Pulling and I agree with the resident's findings and plans as documented in her progress note.  [name] was feeling well today when seen on rounds. Unfortunately, arrangements could not be secured for discharge home today secondary to delivering DME to the home by this evening. She will therefore be discharged home tomorrow morning.

## 2014-11-07 NOTE — Progress Notes (Signed)
Subjective: [name] was seen and examined this AM.  She is feeling well.  No N/V, diarrhea or abd pain.  Feels well to go home.  Objective: Vital signs in last 24 hours: Filed Vitals:   11/06/14 0446 11/06/14 1427 11/06/14 2149 11/07/14 0543  BP: 152/56 155/61 135/54 123/54  Pulse: 80 84 83 79  Temp: 98.5 F (36.9 C) 97.8 F (36.6 C) 98.9 F (37.2 C) 98.5 F (36.9 C)  TempSrc: Oral Oral Oral Oral  Resp: 18 18 16 18   Height:      Weight:      SpO2: 97% 99% 100% 100%   Weight change:   Intake/Output Summary (Last 24 hours) at 11/07/14 1254 Last data filed at 11/07/14 0522  Gross per 24 hour  Intake    480 ml  Output   2375 ml  Net  -1895 ml   General: resting in bed in NAD HEENT: [place]/AT Cardiac: RRR, no rubs, murmurs or gallops Pulm: clear to auscultation bilaterally, moving normal volumes of air Abd: BS present Ext: warm and well perfused, no pedal edema, feet wrapped and in pressure reducing boots, 2+ DPs B/L Neuro: alert and oriented X3,  Responding appropriately  Lab Results: Basic Metabolic Panel:  Recent Labs Lab 10/31/14 2111  11/05/14 0550 11/06/14 0433 11/07/14 0306  NA 141  < >  --  141 140  K 2.9*  < >  --  4.3 4.2  CL 94*  < >  --  112* 109  CO2 33*  < >  --  26 25  GLUCOSE 101*  < >  --  96 89  BUN 10  < >  --  11 10  CREATININE 0.84  < >  --  0.87 0.86  CALCIUM 8.5*  < >  --  8.2* 8.5*  MG 2.0  --  2.3  --   --   PHOS 3.1  --   --   --   --   < > = values in this interval not displayed.  CBC:  Recent Labs Lab 11/01/14 0049  11/06/14 0433 11/07/14 0306  WBC 13.4*  < > 8.6 9.1  NEUTROABS 11.4*  --  6.7  --   HGB 8.8*  < > 7.8* 8.0*  HCT 27.6*  < > 24.0* 25.2*  MCV 95.8  < > 94.1 92.6  PLT 408*  < > 417* 464*  < > = values in this interval not displayed.   Micro Results: tissue culture, right fibula - no growth, final  Medications: I have reviewed the patient's current medications. Scheduled Meds: . anastrozole  1 mg Oral  QHS  . Bacitracin 50,000 units, gentamicin 80mg  & cefazolin 1 g in normal saline (1012ml) mastectomy irrigation   Irrigation Once  . baclofen  30 mg Oral BID  . DULoxetine  60 mg Oral QPC supper  . enoxaparin (LOVENOX) injection  40 mg Subcutaneous Q24H  . feeding supplement  1 Container Oral TID BM  . folic acid  1 mg Oral QPC supper  . gabapentin  1,600 mg Oral BID  . levothyroxine  125 mcg Oral QAC breakfast  . lidocaine  5 mL Other Once  . metoprolol  100 mg Oral QPC supper  . morphine  60 mg Oral Q12H  . multivitamin with minerals  1 tablet Oral Daily  . NIFEdipine  30 mg Oral Daily  . pantoprazole  40 mg Oral BID  . protein supplement  2 oz Oral  QID   Or  . protein supplement  2 oz Oral QID  . vancomycin  750 mg Intravenous Q12H  . vitamin C  500 mg Oral Daily  . zinc sulfate  220 mg Oral Daily   Continuous Infusions: . lactated ringers     PRN Meds:.acetaminophen, loperamide, oxyCODONE-acetaminophen **AND** oxyCODONE, simethicone, sodium chloride   Assessment/Plan: Osteomyelitis:  Per op note, bone exposed so plan to treat patient for osteo despite neg fibula cx. - continue vanc, change zosyn to ceftazidime (becuase zosyn is a long 4 hour infusion) x 6 weeks - home health orders placed for [facility] RN for abx teaching and wound care - had planned for d/c home today but patient's husband unavailable this evening for transport and care  Sacral ulcer:  Appreciate plastic surgery and wound care assistance with this patient.  Patient is s/p debridemend on 07/29. - [facility] orders placed for wound care - she will follow-up at wound center on 08/17 - air mattress ordered for use at home  Paralegia:  Continue home medications.  She is comfortable returning home and husband is primary caretaker.  Anemia:  Hgb 13.7 (per [facility] records in June) but she has been in the 8s this admission and stable.  No gross blood loss noted.  FOBT neg.  Kidney function and calcium normal but  will check SPEP to evaluate for MM.   - SPEP pending, will follow-up result at hospital follow-up  HTN:  Stable on home meds, metoprolol, procardia  Abd pain/diarrhea:  Resolved.  Cdiff neg.    Dispo: Plan for d/c home tomorrow.  The patient does have a current PCP [name], MD) and does not need an [facility] hospital follow-up appointment after discharge.  The patient does not know have transportation limitations that hinder transportation to clinic appointments.  .Services Needed at time of discharge: Y = Yes, Blank = No PT:   OT:   RN:   Equipment:   Other:     LOS: 6 days   Francesca Oman, DO 11/07/2014, 12:54 PM

## 2014-11-07 NOTE — Progress Notes (Signed)
Peripherally Inserted Central Catheter/Midline Placement  The IV Nurse has discussed with the patient and/or persons authorized to consent for the patient, the purpose of this procedure and the potential benefits and risks involved with this procedure.  The benefits include less needle sticks, lab draws from the catheter and patient may be discharged home with the catheter.  Risks include, but not limited to, infection, bleeding, blood clot (thrombus formation), and puncture of an artery; nerve damage and irregular heat beat.  Alternatives to this procedure were also discussed.  PICC/Midline Placement Documentation  PICC / Midline Single Lumen 10/62/69 PICC Left Basilic 40 cm 0 cm (Active)  Indication for Insertion or Continuance of Line Home intravenous therapies (PICC only) 11/07/2014  9:00 AM  Exposed Catheter (cm) 0 cm 11/07/2014  9:00 AM  Dressing Change Due 11/14/14 11/07/2014  9:00 AM       Jule Economy Horton 11/07/2014, 9:32 AM

## 2014-11-07 NOTE — Discharge Instructions (Addendum)
You were hospitalized in order to help the wounds on your buttox, hip, and both heels to heal. You had a surgical procedure to remove the dead tissue from these wounds and help the healthy tissue begin to heal. These wounds will require time, as well as special care including dressing changes and pressure relief, in order to recover fully.  While you were here, you were started on antibiotics through your vein. It was determined that your wounds may be infected, and the wound on your right heal in particular may have an infection that involves the bone. This is called osteomyelitis, and generally requires at least 6 weeks of antibiotics through your vein. In order to accomplish this, you were given an IV line in your arm that will deliver antibiotics into your body's blood circulation centrally. This IV, called at [facility], will remain in place and allow you to receive your antibiotic infusions at home. The antibiotics you will be receiving are called vancomycin and ceftazidime.  You will also have special equipment delivered to your home to help relieve the pressure on your wounds. These include a special air matress, a special wheelchair cushion, and special boots to protect your heels. We have also arranged for you to receive a motorized wheelchair in order to improve your mobility.  You will have a home health nurse who well come to your home in order to teach you how to prepare and complete the daily antibiotic infusions through the PICC line. The home health nurse will also be able to instruct and help you with the dressing changes on your wounds. The wound care supplies will be sent to your pharmacy for you to pick up with you other medications.  In terms of your medications, we want you to restart all of your previous home medications again. In addition to these medications, we have added a medication for heartburn called pantoprazole (Protonix) which you should take each day. This may help with  your upper belly pain. In addition, we would like for you to obtain several vitamin supplements to help your body with the wound healing process. These are: a standard multivitamin with minerals, vitamin C, and zinc. We would also like for you to increase your protein intake with daily protein supplements. You should be able to find these at your local pharmacy. Please look for supplements that have at least 10g of extra protein. Take these protein supplements several times a day.  While you were here, it was noted that your blood count has decreased since it was last checked in June 2016. We have run several test and believe that this anemia is due to the chronic inflammation in your body from the infected and non-healing wounds. We expect that as your wounds heal, your anemia should improve also. We have conducted several other tests to rule out other causes of anemia, all of these have been negative.  You should follow up with the Plastic Surgeons who preformed the surgery on your wounds in order for them to evaluate the healing process. You should also follow up with you primary care doctor in order to have your low blood counts monitored.  Pressure Ulcer A pressure ulcer is a sore that has formed from the breakdown of skin and exposure of deeper layers of tissue. It develops in areas of the body where there is unrelieved pressure. Pressure ulcers are usually found over a bony area, such as the shoulder blades, spine, lower back, hips, knees, ankles, and heels.  Pressure ulcers vary in severity. Your health care provider may determine the severity (stage) of your pressure ulcer. The stages include:  Stage I--The skin is red, and when the skin is pressed, it stays red.  Stage II--The top layer of skin is gone, and there is a shallow, pink ulcer.  Stage III--The ulcer becomes deeper, and it is more difficult to see the whole wound. Also, there may be yellow or brown parts, as well as pink and red  parts.  Stage IV--The ulcer may be deep and red, pink, brown, white, or yellow. Bone or muscle may be seen.  Unstageable pressure ulcer--The ulcer is covered almost completely with black, brown, or yellow tissue. It is not known how deep the ulcer is or what stage it is until this covering comes off.  Suspected deep tissue injury--A person's skin can be injured from pressure or pulling on the skin when his or her position is changed. The skin appears purple or maroon. There may not be an opening in the skin, but there could be a blood-filled blister. This deep tissue injury is often difficult to see in people with darker skin tones. The site may open and become deeper in time. However, early interventions will help the area heal and may prevent the area from opening. CAUSES  Pressure ulcers are caused by pressure against the skin that limits the flow of blood to the skin and nearby tissues. There are many risk factors that can lead to pressure sores. RISK FACTORS  Decreased ability to move.  Decreased ability to feel pain or discomfort.  Excessive skin moisture from urine, stool, sweat, or secretions.  Poor nutrition.  Dehydration.  Tobacco, drug, or alcohol abuse.  Having someone pull on bedsheets that are under you, such as when health care workers are changing your position in a hospital bed.  Obesity.  Increased adult age.  Hospitalization in a critical care unit for longer than 4 days with use of medical devices.  Prolonged use of medical devices.  Critical illness.  Anemia.  Traumatic brain injury.  Spinal cord injury.  Stroke.  Diabetes.  Poor blood glucose control.  Low blood pressure (hypotension).  Low oxygen levels.  Medicines that reduce blood flow.  Infection. DIAGNOSIS  Your health care provider will diagnose your pressure ulcer based on its appearance. The health care provider may determine the stage of your pressure ulcer as well. Tests may be  done to check for infection, to assess your circulation, or to check for other diseases, such as diabetes. TREATMENT  Treatment of your pressure ulcer begins with determining what stage the ulcer is in. Your treatment team may include your health care provider, a wound care specialist, a nutritionist, a physical therapist, and a Psychologist, sport and exercise. Possible treatments may include:   Moving or repositioning every 1-2 hours.  Using beds or mattresses to shift your body weight and pressure points frequently.  Improving your diet.  Cleaning and bandaging (dressing) the open wound.  Giving antibiotic medicines.  Removing damaged tissue.  Surgery and sometimes skin grafts. HOME CARE INSTRUCTIONS  If you were hospitalized, follow the care plan that was started in the hospital.  Avoid staying in the same position for more than 2 hours. Use padding, devices, or mattresses to cushion your pressure points as directed by your health care provider.  Eat a well-balanced diet. Take nutritional supplements and vitamins as directed by your health care provider.  Keep all follow-up appointments.  Only take over-the-counter or prescription  medicines for pain, fever, or discomfort as directed by your health care provider. SEEK MEDICAL CARE IF:   Your pressure ulcer is not improving.  You do not know how to care for your pressure ulcer.  You notice other areas of redness on your skin.  You have a fever. SEEK IMMEDIATE MEDICAL CARE IF:   You have increasing redness, swelling, or pain in your pressure ulcer.  You notice pus coming from your pressure ulcer.  You notice a bad smell coming from the wound or dressing.  Your pressure ulcer opens up again. Document Released: 03/24/2005 Document Revised: 03/29/2013 Document Reviewed: 11/29/2012 [facility] Patient Information 2015 [place], [place]. This information is not intended to replace advice given to you by your health care provider. Make sure you discuss  any questions you have with your health care provider.

## 2014-11-07 NOTE — Progress Notes (Addendum)
ANTIBIOTIC CONSULT NOTE - FOLLOW UP  Pharmacy Consult for Ceftazidime Indication: Osteomyelitis  Allergies  Allergen Reactions  . Ivp Dye [Iodinated Diagnostic Agents] Other (See Comments)    "hot and sweaty and almost passed out"  . Aspirin Hives  . Sulfa Antibiotics Hives    Patient Measurements: Height: 5\' 3"  (160 cm) Weight: 170 lb (77.111 kg) IBW/kg (Calculated) : 52.4   Vital Signs: Temp: 98.5 F (36.9 C) (08/02 0543) Temp Source: Oral (08/02 0543) BP: 123/54 mmHg (08/02 0543) Pulse Rate: 79 (08/02 0543) Intake/Output from previous day: 08/01 0701 - 08/02 0700 In: 720 [P.O.:720] Out: 2825 [Urine:2825] Intake/Output from this shift:    Labs:  Recent Labs  11/05/14 0455 11/05/14 0550 11/06/14 0433 11/07/14 0306  WBC  --  9.9 8.6 9.1  HGB  --  8.2* 7.8* 8.0*  PLT  --  408* 417* 464*  CREATININE 1.01*  --  0.87 0.86   Estimated Creatinine Clearance: 63.3 mL/min (by C-G formula based on Cr of 0.86). No results for input(s): VANCOTROUGH, VANCOPEAK, VANCORANDOM, GENTTROUGH, GENTPEAK, GENTRANDOM, TOBRATROUGH, TOBRAPEAK, TOBRARND, AMIKACINPEAK, AMIKACINTROU, AMIKACIN in the last 72 hours.   Microbiology: Recent Results (from the past 720 hour(s))  Clostridium Difficile by PCR (not at [facility])     Status: None   Collection Time: 10/31/14  4:17 PM  Result Value Ref Range Status   Toxigenic C Difficile by pcr NEGATIVE NEGATIVE Final  Culture, blood (routine x 2)     Status: None   Collection Time: 11/01/14 12:41 AM  Result Value Ref Range Status   Specimen Description BLOOD LEFT ANTECUBITAL  Final   Special Requests BOTTLES DRAWN AEROBIC AND ANAEROBIC 4CC EA  Final   Culture NO GROWTH 5 DAYS  Final   Report Status 11/06/2014 FINAL  Final  Culture, blood (routine x 2)     Status: None   Collection Time: 11/01/14 12:49 AM  Result Value Ref Range Status   Specimen Description BLOOD LEFT ANTECUBITAL  Final   Special Requests BOTTLES DRAWN AEROBIC ONLY 4CC  Final    Culture NO GROWTH 5 DAYS  Final   Report Status 11/06/2014 FINAL  Final  Surgical pcr screen     Status: Abnormal   Collection Time: 11/02/14  4:20 AM  Result Value Ref Range Status   MRSA, PCR POSITIVE (A) NEGATIVE Final    Comment: RESULT CALLED TO, READ BACK BY AND VERIFIED WITH: C HUCKBEE,RN [phone] WILDERK    Staphylococcus aureus POSITIVE (A) NEGATIVE Final    Comment:        The Xpert SA Assay (FDA approved for NASAL specimens in patients over 79 years of age), is one component of a comprehensive surveillance program.  Test performance has been validated by [facility] for patients greater than or equal to 48 year old. It is not intended to diagnose infection nor to guide or monitor treatment. RESULT CALLED TO, READ BACK BY AND VERIFIED WITHPerry Mount [id number] [place]   Tissue culture     Status: None   Collection Time: 11/03/14  1:09 PM  Result Value Ref Range Status   Specimen Description TISSUE  Final   Special Requests  RIGHT FIBULA  Final   Gram Stain   Final    NO WBC SEEN NO ORGANISMS SEEN Performed at [facility]    Culture   Final    NO GROWTH 3 DAYS Performed at [facility]    Report Status 11/06/2014 FINAL  Final  Anti-infectives    Start     Dose/Rate Route Frequency Ordered Stop   11/03/14 1100  bacitracin 50,000 Units, gentamicin (GARAMYCIN) 80 mg, ceFAZolin (ANCEF) 1 g in sodium chloride 0.9 % 1,000 mL      Irrigation Once 11/03/14 1050     11/01/14 1400  piperacillin-tazobactam (ZOSYN) IVPB 3.375 g  Status:  Discontinued     3.375 g 12.5 mL/hr over 240 Minutes Intravenous 3 times per day 11/01/14 0308 11/07/14 1232   11/01/14 0330  vancomycin (VANCOCIN) IVPB 750 mg/150 ml premix     750 mg 150 mL/hr over 60 Minutes Intravenous Every 12 hours 11/01/14 0308     11/01/14 0330  piperacillin-tazobactam (ZOSYN) IVPB 3.375 g     3.375 g 100 mL/hr over 30 Minutes Intravenous  Once 11/01/14 0308 11/01/14 0435       Assessment: 64 yo female consulted for vancomycin/zosyn for decub ulcer. Tissue cx reported negative but taken after patient started antibiotics, so may suspected false negative.  Per op note, bone was exposed so suspected osteomyelitis, will treat with 6 weeks of antibiotics.  Zosyn changed to ceftazidime, continue vancomycin.  Patient's renal function has been stable.   Goal of Therapy: Vanc trough 15-20 mcg/mL given osteo   Plan:  - Continue Vanc 750mg  IV Q12H - Recheck vancomycin trough prior to PM dose tonight - Begin Ceftazidime 2 gm every 8 hours - Monitor clinical picture, renal function, VT prn - F/U C&S, abx deescalation / LOT   Reatha Harps, Pharm.D., BCPS Clinical Pharmacist [phone] Pager 11/07/2014 12:52 PM  Thuy D. Mina Marble, PharmD, BCPS Pager:  [phone] 11/07/2014, 3:06 PM   _________________________________ Addendum:  VT = 19 mcg/mL  Within goal, continue current dosing.  Levester Fresh, PharmD, BCPS Clinical Pharmacist Pager [phone] 11/07/2014 4:56 PM

## 2014-11-07 NOTE — Care Management (Signed)
Patient receiving first dose of Fortaz at present.  Medication is every 8 hours . [facility] unable to start care this evening , start of care will be first thing in morning . Holley Raring, MS4 - Pager: [phone] aware and spoke with pharmacy , both are in agreement for start of care in morning .   Patient and husband aware .   Magdalen Spatz RN BSN [phone]

## 2014-11-07 NOTE — Discharge Summary (Signed)
Name: [name] MRN: [id number] DOB: [birth year] 64 y.o. PCP: [name], MD  Date of Admission: 10/31/2014  3:21 PM Date of Discharge: 11/07/2014 Attending Physician: Oval Linsey, MD  Discharge Diagnosis: 1. Decubitus Pressure Ulcers - Sacrum, R Ankle, L Heel 2. Osteomyelitis, R Ankle 3. Anemia of Chronic Disease 4. GERD/dyspepsia vs abdominal muscle spasm  Principal Problem:   Osteomyelitis Active Problems:   HTN (hypertension)   Breast cancer of lower-inner quadrant of right female breast   Sacral ulcer   Hypothyroidism   Paraplegia   Sacral decubitus ulcer  Discharge Medications:   Medication List    TAKE these medications        acetaminophen 500 MG tablet  Commonly known as:  TYLENOL  Take 500 mg by mouth every 6 (six) hours as needed for mild pain or moderate pain.     anastrozole 1 MG tablet  Commonly known as:  ARIMIDEX  Take 1 tablet (1 mg total) by mouth daily.     ascorbic acid 500 MG tablet  Commonly known as:  VITAMIN C  Take 1 tablet (500 mg total) by mouth daily.     baclofen 10 MG tablet  Commonly known as:  LIORESAL  Take 30 mg by mouth 2 (two) times daily.     BEANO MELTAWAYS PO  Take 1 tablet by mouth daily as needed (stomach pain).     cefTAZidime 2 g in dextrose 5 % 50 mL  Inject 2 g into the vein every 8 (eight) hours.     DULoxetine 60 MG capsule  Commonly known as:  CYMBALTA  Take 60 mg by mouth daily after supper.     folic acid 1 MG tablet  Commonly known as:  FOLVITE  Take 1 mg by mouth daily after supper.     furosemide 40 MG tablet  Commonly known as:  LASIX  Take 40 mg by mouth daily after supper.     gabapentin 800 MG tablet  Commonly known as:  NEURONTIN  Take 1,600 mg by mouth 2 (two) times daily.     GAS-X EXTRA STRENGTH 125 MG Caps  Generic drug:  Simethicone  Take 125 mg by mouth daily as needed (stomach pain).     levothyroxine 125 MCG tablet  Commonly known as:  SYNTHROID, LEVOTHROID  Take 1 tablet  (125 mcg total) by mouth daily before breakfast.     methocarbamol 500 MG tablet  Commonly known as:  ROBAXIN  Take 1 tablet (500 mg total) by mouth every 6 (six) hours as needed.     metoprolol 100 MG tablet  Commonly known as:  LOPRESSOR  Take 100 mg by mouth daily after supper.     morphine 60 MG 12 hr tablet  Commonly known as:  MS CONTIN  Take 60 mg by mouth every 12 (twelve) hours.     multivitamin with minerals Tabs tablet  Take 1 tablet by mouth daily.     NIFEdipine 30 MG 24 hr tablet  Commonly known as:  PROCARDIA-XL/ADALAT-CC/NIFEDICAL-XL  Take 30 mg by mouth daily.     oxyCODONE-acetaminophen 10-325 MG per tablet  Commonly known as:  PERCOCET  Take 1 tablet by mouth 6 (six) times daily.     pantoprazole 40 MG tablet  Commonly known as:  PROTONIX  Take 1 tablet (40 mg total) by mouth daily.     Vancomycin 750 MG/150ML Soln  Commonly known as:  VANCOCIN  Inject 150 mLs (750 mg total) into the vein  every 12 (twelve) hours.  Start taking on:  11/08/2014     zinc sulfate 220 MG capsule  Take 1 capsule (220 mg total) by mouth daily.        Disposition and follow-up:   [name] was discharged from [facility] in Stable condition.  At the hospital follow up visit please address:  1. Osteomyelitis, on home infusion of IV antibiotics  2. Anemia, likely secondary to chronic inflammation  2.  Labs / imaging needed at time of follow-up: WBC, Hgb, SCr, K  3.  Pending labs / test needing follow-up: Serum Protein Electrophoresis to evaluate for Multiple Myeloma, H. pylori stool antigen  Follow-up Appointments: Follow-up Information    Follow up with Irene Limbo, MD. Go on 11/22/2014.   Specialty:  Plastic Surgery   Why:  @ 8:15 AM, For wound re-check   Contact information:   [address] [place] [address] [phone]       Follow up with Juluis Mire, MD On 11/21/2014.   Specialty:  Internal Medicine   Why:   '@3'$ :15 PM, For monitoring of her anemia and hospital follow-up.   Contact information:   [place] [address] [phone]      Discharge Instructions: You were hospitalized in order to help the wounds on your buttox, hip, and both heels to heal. You had a surgical procedure to remove the dead tissue from these wounds and help the healthy tissue begin to heal. These wounds will require time, as well as special care including dressing changes and pressure relief, in order to recover fully.  While you were here, you were started on antibiotics through your vein. It was determined that your wounds may be infected, and the wound on your right heal in particular may have an infection that involves the bone. This is called osteomyelitis, and generally requires at least 6 weeks of antibiotics through your vein. In order to accomplish this, you were given an IV line in your arm that will deliver antibiotics into your body's blood circulation centrally. This IV, called at [facility] Line, will remain in place and allow you to receive your antibiotic infusions at home. The antibiotics you will be receiving are called vancomycin and ceftazidime.  You will also have special equipment delivered to your home to help relieve the pressure on your wounds. These include a special air matress, a special wheelchair cushion, and special boots to protect your heels. We have also arranged for you to receive a motorized wheelchair in order to improve your mobility.  You will have a home health nurse who well come to your home in order to teach you how to prepare and complete the daily antibiotic infusions through the PICC line. The home health nurse will also be able to instruct and help you with the dressing changes on your wounds. The wound care supplies will be sent to your pharmacy for you to pick up with you other medications.  In terms of your medications, we want you to restart all of your previous home  medications again. In addition to these medications, we have added a medication for heartburn called pantoprazole (Protonix) which you should take each day. This may help with your upper belly pain. In addition, we would like for you to obtain several vitamin supplements to help your body with the wound healing process. These are: a standard multivitamin with minerals, vitamin C, and zinc. We would also like for  you to increase your protein intake with daily protein supplements. You should be able to find these at your local pharmacy. Please look for supplements that have at least 10g of extra protein. Take these protein supplements several times a day.  While you were here, it was noted that your blood count has decreased since it was last checked in June 2016. We have run several test and believe that this anemia is due to the chronic inflammation in your body from the infected and non-healing wounds. We expect that as your wounds heal, your anemia should improve also. We have conducted several other tests to rule out other causes of anemia, all of these have been negative.  You should follow up with the Plastic Surgeons who preformed the surgery on your wounds in order for them to evaluate the healing process. You should also follow up with you primary care doctor in order to have your low blood counts monitored.  Consultations:  Plastic Surgery, Nutrition, Wound Care, Case Management  Procedures Performed:  Ct Abdomen Pelvis W Contrast  11/01/2014   CLINICAL DATA:  Right and left upper quadrant pain. History of paraplegia with sacral decubitus ulcer.  EXAM: CT ABDOMEN AND PELVIS WITH CONTRAST  TECHNIQUE: Multidetector CT imaging of the abdomen and pelvis was performed using the standard protocol following bolus administration of intravenous contrast.  CONTRAST:  47mL OMNIPAQUE IOHEXOL 300 MG/ML SOLN, 99mL OMNIPAQUE IOHEXOL 300 MG/ML SOLN  COMPARISON:  None.  FINDINGS: Lower chest:  The included lung  bases are clear.  Hepatobiliary: The liver is prominent in size with decreased density. Subcentimeter 8 mm hypodensity in the right hepatic lobe. Gallbladder is physiologically distended. No calcified gallstones. Mild diffuse prominence of the common bile duct measuring 9 mm in the mid distal portion. No calcified choledocholithiasis.  Pancreas: Mild atrophy without ductal dilatation or surrounding inflammation.  Spleen: Normal in size with scattered splenic granulomas.  Adrenals/Urinary Tract: Asymmetric renal size with right kidney smaller than left and thinning of the right renal parenchyma. Bilateral nonobstructing renal calculi. No hydronephrosis or obstructive uropathy. Simple cyst in the upper left kidney measures 1.4 cm. No adrenal nodule.  Stomach/Bowel: Stomach is decompressed. There are no dilated or thickened small bowel loops. No obstruction, oral contrast is seen throughout the colon. Small volume of colonic stool. The appendix is normal.  Vascular/Lymphatic: No retroperitoneal adenopathy. Abdominal aorta is normal in caliber. Calcified and noncalcified atheromatous plaque about the abdominal aorta and its branches, no aneurysm. Prominent left inguinal lymph nodes.  Reproductive: Uterus surgically absent.  No adnexal mass.  Bladder: Physiologically distended with questionable bladder wall thickening.  Other: Soft tissue air and induration about the posterior medial left upper thigh, partially included. Scattered foci of air in the left flank soft tissues. Soft tissue edema posterior to the sacrum.  Musculoskeletal: Multilevel degenerative change throughout the lumbar spine. There are no acute or suspicious osseous abnormalities.  IMPRESSION: 1. Soft tissue air and induration about the posterior medial left upper thigh, partially included. This is likely related to decubitus ulcer. Scattered air in the left flank soft tissues. 2. Bilateral nonobstructing renal calculi. Question of bladder wall  thickening, correlation with urinalysis recommended to exclude urinary tract infection. Asymmetric renal size with scarring in the right kidney. 3. Prominence of the extrahepatic biliary tree without definable cause. No gallbladder distention. 4. Hepatomegaly and hepatic steatosis. 5. Atherosclerosis.   Electronically Signed   By: Jeb Levering M.D.   On: 11/01/2014 04:26    Admission  HPI: Ms. Yarbro is a 64 yo female with HTN, HLD, hypothyroidism, h/o ER/PR+ breast cancer, chronic pain, and paraplegia, presents with abdominal pain and sacral decubitus ulcer.   She was sent to the hospital by her PCP for management of a decubitus ulcer on her right buttock. According to the patient's daughter, it started at about 1 cm and has grown over the course of a month to 4 cm. The patient's had previously said that he could fist in it. A home health nurse has been visiting 3 times/week for wound care and told the patient and family that it was not that bad. Per patient and daughter, the nurse has been applying a combination of clorox, baking soda, and saline for treatment. She also has decubitus ulcers to the superior gluteal crease, right lateral heel, and left lateral heel.   The patient and her daughter report a 3 week history of worsening gas and abdominal pain. She reports she can feel the gas move from her LUQ to RUQ, "under my rib cage," usually forcing the patient to stop what she's doing and catch her breath because the pain is too intense. She has been taking GasX and Beano for 4 days without relief. She has also had a 4 days of loose stools. She is unable to say whether there has been blood or mucus in the stool. She has not been eating well for the last 4 days. The patient states this is because she has had a foul taste in her mouth for the last week. She says it is metallic tasting and does not change with time of day or food.   The patient endorses subjective fever, nausea, and cough.  She denies chest pain, SOB, or dysphagia. She is incontinent of urine and stool and cannot tell if she is having dysuria.  Hospital Course by problem list: Principal Problem:   Osteomyelitis Active Problems:   HTN (hypertension)   Breast cancer of lower-inner quadrant of right female breast   Sacral ulcer   Hypothyroidism   Paraplegia   Sacral decubitus ulcer   1. Decubitus ulcers with osteomyelitis The patient presented to the ED from her PCP due to progression of her decubitus ulcers which were though to be infected. On arrival in the ED she was evaluated for systemic infection, and found to have a leukocytosis, but was afebrile and hemodynamically stable. She was started empirically on broadspectrum abx therapy of Vancomycin and Zosyn due to concerns for an intra-abdominal infection, which was ruled out after a CT scan of the abdomen and pelvis. The antibiotics were continued for empiric coverage of her decubitus ulcers and she was taken to the OR by plastic surgery for debridement of the wounds on 7/29. The operative report notes that there was exposed bone visible on th R ankle ulcer. Ulcer descriptions from the op report are below:  - 5x7x7cm ischial wound, 4.5x6x1cm L heel wound, 8.2x4.5x1cm R lat ankle The wounds have been dressed according to Plastics recommendations as follows: - Sacrum wound: foam dressing change q week - L ischium: silver alginate q 2d covered with absorptive dressing - L heel: silver alginate q 2d covered with kerlix plus heel foam - R lat ankle: silver alginate q 2d covered with kerlix A bone culture was obtain during the operation which was negative. However, in the setting of 2 days of prior antibiotic therapy and the observation of exposed bone in one of the wounds, it was determined to continue with broad spectrum antibiotic  therapy for presumed osteomyelitis. A PICC line was placed and home infusion antibiotics were ordered. She is scheduled to follow up  with for wound management in with plastics in 2 weeks.  2.  Abdominal pain and diarrhea Upon presentation, the patient endorsed significant abdominal pain and complained of several days of diarrhea. She was found to have a tense abdomen, in conjunction with a leukocytosis, and an abdominal CT was ordered, which was negative. She had been started on broad spectrum antibiotics empirically. Stool testing for C. difficile was negative. She was treated with PPI for suspected GERD and loperamide for diarrheal control to minimize wound contamination. Her leukocytosis had resolved by hospital day #3 and antibiotics were continued for coverage of her ulcers as above. At the time of discharge she was only complaining of mild epigasatric pain. Her diarrhea had significantly resolved from 3+ times daily stools to 1 daily stool, although this was still loose. Her continued loose stool was though to be related to antibiotic therapy. Her mild epigastric tenderness was interrogated with a H. Pylori stool test, which is pending at the time of discharge.  3.  Anemia Ms. Marchese presented with a Hgb of 9.0. Outside records obtained from [facility] demonstrate that her last known Hgb was 13.7 on September 15, 2014. Her Hgb down-trended minimally during her hospitalization with a nader of 7.8. At the time of discharge her Hgb was stable ~8.0. Her anemia is normocytic and hypoproliferative with a reticulocyte index of 0.8. This was interrogated with 2 stool guaiac tests which was negative. She denied any history of melanotic stools or hematemisis. Iron studies demonstrated an elevated ferritin, low Fe, low Fe saturation, and low TIBC, all of which are consistent with anemia of chronic inflammation. Her B12 and Folate levels were within normal limits. Of note, she takes daily folate supplementation. Given her history of breast cancer, a blood smear was obtained to investigate possible bone marrow infiltration, but this was benign  demonstrating only polychromasia. An serum protein electrophoresis study was obtained to evaluate for possible multiple myeloma, and was pending at the time of discharge. She was instructed to follow up with her PCP for monitoring and further investigation of her anemia.  Discharge Vitals:   BP 123/54 mmHg  Pulse 79  Temp(Src) 98.5 F (36.9 C) (Oral)  Resp 18  Ht $R'5\' 3"'bA$  (1.6 m)  Wt 77.111 kg (170 lb)  BMI 30.12 kg/m2  SpO2 100%  Discharge Labs:  Results for orders placed or performed during the hospital encounter of 10/31/14 (from the past 24 hour(s))  Basic metabolic panel     Status: Abnormal   Collection Time: 11/07/14  3:06 AM  Result Value Ref Range   Sodium 140 135 - 145 mmol/L   Potassium 4.2 3.5 - 5.1 mmol/L   Chloride 109 101 - 111 mmol/L   CO2 25 22 - 32 mmol/L   Glucose, Bld 89 65 - 99 mg/dL   BUN 10 6 - 20 mg/dL   Creatinine, Ser 0.86 0.44 - 1.00 mg/dL   Calcium 8.5 (L) 8.9 - 10.3 mg/dL   GFR calc non Af Amer >60 >60 mL/min   GFR calc Af Amer >60 >60 mL/min   Anion gap 6 5 - 15  CBC     Status: Abnormal   Collection Time: 11/07/14  3:06 AM  Result Value Ref Range   WBC 9.1 4.0 - 10.5 K/uL   RBC 2.72 (L) 3.87 - 5.11 MIL/uL   Hemoglobin 8.0 (L)  12.0 - 15.0 g/dL   HCT 25.2 (L) 36.0 - 46.0 %   MCV 92.6 78.0 - 100.0 fL   MCH 29.4 26.0 - 34.0 pg   MCHC 31.7 30.0 - 36.0 g/dL   RDW 14.4 11.5 - 15.5 %   Platelets 464 (H) 150 - 400 K/uL    Signed: Effie Berkshire, Med Student 11/07/2014, 4:30 PM   Services Ordered on Discharge: [facility] for home antibiotic infusion and wound care Equipment Ordered on Discharge: Air overlay matress, ROHO wheelchair cushion, slide board, podus orthotic boots, motorized wheelchair

## 2014-11-07 NOTE — Plan of Care (Signed)
Problem: Phase I Progression Outcomes Goal: Voiding-avoid urinary catheter unless indicated Outcome: Not Applicable Date Met:  93/24/19 Pt has foley cath d/t a stage 4 wound to ischium

## 2014-11-07 NOTE — Clinical Social Work Note (Signed)
CSW received referral for SNF.  Case discussed with case manager and plan is to discharge home with home health.  CSW to sign off please re-consult if social work needs arise.  Shykeria Sakamoto R. Zebulan Hinshaw, MSW, LCSWA [phone]  

## 2014-11-07 NOTE — Care Management Note (Signed)
Case Management Note  Patient Details  Name: Caleigh Rabelo MRN: [id number] Date of Birth: [birth year]  Subjective/Objective:                    Action/Plan:  [facility] will have DME delivered to home . Patient and husband aware .  Expected Discharge Date:                  Expected Discharge Plan:  [place]  In-House Referral:     Discharge planning Services  CM Consult  Post Acute Care Choice:    Choice offered to:  Patient  DME Arranged:  Specialty mattress, Media planner DME Agency:  [place] Arranged:  RN, PT, OT [facility] Agency:  [place]  Status of Service:  In process, will continue to follow  Medicare Important Message Given:    Date Medicare IM Given:    Medicare IM give by:    Date Additional Medicare IM Given:    Additional Medicare Important Message give by:     If discussed at [place] of Stay Meetings, dates discussed:    Additional Comments:  Marilu Favre, RN 11/07/2014, 11:09 AM

## 2014-11-07 NOTE — Progress Notes (Signed)
Physical Therapy Treatment Patient Details Name: [name] MRN: [id number] DOB: 1950-12-26 Today's Date: 11/07/2014    History of Present Illness 64 yo female with sacral and heel ulcers debrided on 11/03/14.  PMHx:  breast CA, chronic pain, paraplegia    PT Comments    Pt with deep L gluteal fold wound. Worked with OT to extensively educate spouse and PT on the importance of pressure relief and the use of hoyer lift for all transfers. Recommend HHPT to focus on getting spouse efficient with use of hoyer lift as he is the sole caregiver. Also recommend home health aide to assist bathing patient and transfering patient out of bed until spouse is efficient with completing hoyer lift transfer safely by himself. Due to severity of L gluteal fold wound we recommend a tilt in space electric w/c with ROHO cushion to allow for constant pressure relief to prevent further breakdown and promote healing of current wound. Acute PT to con't to monitor patient to further educate on exercise program in bed.   Follow Up Recommendations  Home health PT;Supervision/Assistance - 24 hour     Equipment Recommendations  Wheelchair (measurements PT);Wheelchair cushion (measurements PT) (tilt in space with ROHO cushion, HH aide)    Recommendations for Other Services       Precautions / Restrictions Precautions Precautions: Fall Precaution Comments: pt deep wound to L gluteal fold and large R lateral necrotic ankle wound Restrictions Weight Bearing Restrictions: No    Mobility  Bed Mobility Overal bed mobility: Needs Assistance Bed Mobility: Rolling           General bed mobility comments: discussed extensively with patient and spouse about positioning in bed and how to use pillows and turning to off weight L gluteal fold and bilat ankles. pt and spouse with good understanding. educted to change positions every 2 hours  Transfers                    Ambulation/Gait                  Stairs            Wheelchair Mobility    Modified Rankin (Stroke Patients Only)       Balance                                    Cognition Arousal/Alertness: Awake/alert Behavior During Therapy: WFL for tasks assessed/performed Overall Cognitive Status: Within Functional Limits for tasks assessed                      Exercises      General Comments General comments (skin integrity, edema, etc.): pt with deep L gluteal fold wound and bilat ankle ulcers      Pertinent Vitals/Pain Pain Assessment: No/denies pain    Home Living                      Prior Function            PT Goals (current goals can now be found in the care plan section) Acute Rehab PT Goals Patient Stated Goal: return home Progress towards PT goals: Progressing toward goals    Frequency  Min 2X/week    PT Plan Current plan remains appropriate    Co-evaluation PT/OT/SLP Co-Evaluation/Treatment: Yes Reason for Co-Treatment: Complexity of the patient's impairments (multi-system involvement) PT goals addressed  during session: Proper use of DME;Mobility/safety with mobility       End of Session   Activity Tolerance: Patient tolerated treatment well Patient left: in bed;with call bell/phone within reach;with family/visitor present     Time: 1333-1410 PT Time Calculation (min) (ACUTE ONLY): 37 min  Charges:  $Therapeutic Activity: 8-22 mins                    G Codes:      Kingsley Callander 11/07/2014, 3:27 PM  Kittie Plater, PT, DPT Pager #: [phone] Office #: [phone]

## 2014-11-07 NOTE — Progress Notes (Signed)
Occupational Therapy Treatment Patient Details Name: [name] MRN: [id number] DOB: Apr 23, 1950 Today's Date: 11/07/2014    History of present illness 64 yo female with sacral and heel ulcers debrided on 11/03/14.  PMHx:  breast CA, chronic pain, paraplegia   OT comments  Patient with good carryover from education yesterday. Continued pressure relief education today with patient and husband. Recommended husband only perform hoyer lift transfers at this time due to left gluteal wound. Plan is for patient to transport from hospital > home via ambulance. Pt states she plans to stay in air mattress overlay at home, not getting up at this time due to wound. Educated husband and patient on positioning in bed to get pressure off left gluteal.    Follow Up Recommendations  Home health OT;Supervision/Assistance - 24 hour    Equipment Recommendations  Wheelchair cushion (measurements OT);Hospital bed;Wheelchair (measurements OT) (tilt-in-space w/c and ROHO cusion)    Recommendations for Other Services  None at this time   Precautions / Restrictions Precautions Precautions: Fall Precaution Comments: pt deep wound to L gluteal fold and large R lateral necrotic ankle wound Restrictions Weight Bearing Restrictions: No       Mobility Bed Mobility Overal bed mobility: Needs Assistance Bed Mobility: Rolling     Supine to sit: +2 for physical assistance;Max assist     General bed mobility comments: discussed extensively with patient and spouse about positioning in bed and how to use pillows and turning to off weight L gluteal fold and bilat ankles. pt and spouse with good understanding. educted to change positions every 2 hours  Transfers General transfer comment: not attempted pt is paraplegic, pt preferred to stay in bed with air mattress due to pressure wounds         ADL Overall ADL's : At baseline General ADL Comments: Pt required assistance from husband PTA.      Cognition    Behavior During Therapy: WFL for tasks assessed/performed Overall Cognitive Status: Within Functional Limits for tasks assessed                Pertinent Vitals/ Pain       Pain Assessment: No/denies pain   Frequency Min 2X/week     Progress Toward Goals  OT Goals(current goals can now befound in the care plan section)  Progress towards OT goals: Progressing toward goals  Acute Rehab OT Goals Patient Stated Goal: return home  Plan Discharge plan remains appropriate    Co-evaluation    PT/OT/SLP Co-Evaluation/Treatment: Yes Reason for Co-Treatment: Complexity of the patient's impairments (multi-system involvement) PT goals addressed during session: Proper use of DME;Mobility/safety with mobility OT goals addressed during session: ADL's and self-care;Other (comment) (positioning)    End of Session     Activity Tolerance Patient tolerated treatment well   Patient Left in bed;with call bell/phone within reach     Time: 6195-0932 OT Time Calculation (min): 35 min  Charges: OT Treatments $Self Care/Home Management : 8-22 mins  Melodye Swor , MS, OTR/L, CLT Pager: [phone]  11/07/2014, 4:04 PM

## 2014-11-07 NOTE — Care Management Important Message (Signed)
Important Message  Patient Details  Name: [name] MRN: [id number] Date of Birth: [birth year]   Medicare Important Message Given:  Yes-second notification given    Delorse Lek 11/07/2014, 3:05 PM

## 2014-11-07 NOTE — Progress Notes (Signed)
I received a call from P.T. Inquiring into the possibility of an inpatient rehab admission. I reviewed admission, pt with h/o paraplegia, with osteomyelitis. [employer] medicare will not approve an inpt rehab admission for current admission diagnosis for DME attainment and pt/family education. I recommend maximum home health to follow up on family education, DME attainment, etc. Noted plans for follow up at Wound care clinic and an appointment in Oct at [facility] to see PMR MD. If family unable to care for pt at current level, SNF would be recommended. Please call me for any questions. [phone]

## 2014-11-07 NOTE — Care Management Important Message (Signed)
Important Message  Patient Details  Name: [name] MRN: [id number] Date of Birth: 07/28/1950   Medicare Important Message Given:   11-07-14    Marilu Favre, RN 11/07/2014, 2:45 PM

## 2014-11-07 NOTE — Progress Notes (Signed)
Nutrition Follow-up  DOCUMENTATION CODES:   Obesity unspecified  INTERVENTION:   -Boost Breeze po TID, each supplement provides 250 kcal and 9 grams of protein -Unjury supplement QID -MVI daily  NUTRITION DIAGNOSIS:   Increased nutrient needs related to wound healing as evidenced by estimated needs.  Ongoing  GOAL:   Patient will meet greater than or equal to 90% of their needs  Progressing  MONITOR:   PO intake, Supplement acceptance, Diet advancement, Labs, I & O's, Skin, Weight trends  REASON FOR ASSESSMENT:   Low Braden, Consult Wound healing  ASSESSMENT:   [name] is a 64 yo female with HTN, HLD, hypothyroidism, h/o ER/PR+ breast cancer, chronic pain, and paraplegia, presents with abdominal pain and sacral decubitus ulcer.   S/p PROCEDURE 11/03/14:  Excisional Debridement buttock left (ischium), right leg and ankle, left heel  Pt sleeping soundly at time of visit. Observed breakfast tray on counter; noted less than 25% of meal consumed. However, meal intake has been fair overall per meal completion records (PO: 50-75%).   Pt has Boost Breeze and Unjury supplements ordered. Pt is consuming supplements >75% of the time. Will continue supplements to support increased nutrient needs for wound healing.   RNCM following. Plan is to d/c home with IV antibiotics. Per MD notes, anticipate d/c soon once this can be arranged.   Diet Order:  Diet regular Room service appropriate?: Yes; Fluid consistency:: Thin  Skin:  Wound (see comment) (DTI lt heel, UN lt ischium and rt ankle, st II sacrum x3)  Last BM:  11/05/14  Height:   Ht Readings from Last 1 Encounters:  10/31/14 5\' 3"  (1.6 m)    Weight:   Wt Readings from Last 1 Encounters:  10/31/14 170 lb (77.111 kg)    Ideal Body Weight:  48.6 kg  BMI:  Body mass index is 30.12 kg/(m^2).  Estimated Nutritional Needs:   Kcal:  2100-2300  Protein:  115-130 grams  Fluid:  2.1-2.3 L  EDUCATION NEEDS:    Education needs addressed  [name], RD, LDN, CDE Pager: [phone] After hours Pager: [phone]

## 2014-11-08 DIAGNOSIS — M86171 Other acute osteomyelitis, right ankle and foot: Secondary | ICD-10-CM

## 2014-11-08 DIAGNOSIS — R1013 Epigastric pain: Secondary | ICD-10-CM

## 2014-11-08 MED ORDER — DAKINS (1/4 STRENGTH) 0.125 % EX SOLN
Freq: Three times a day (TID) | CUTANEOUS | Status: DC
  Administered 2014-11-08: 11:00:00
  Filled 2014-11-08: qty 473

## 2014-11-08 MED ORDER — DAKINS (1/4 STRENGTH) 0.125 % EX SOLN: Freq: Three times a day (TID) | CUTANEOUS | Status: AC

## 2014-11-08 MED ORDER — HEPARIN SOD (PORK) LOCK FLUSH 100 UNIT/ML IV SOLN
250.0000 [IU] | INTRAVENOUS | Status: AC | PRN
  Administered 2014-11-08: 250 [IU]

## 2014-11-08 NOTE — Progress Notes (Signed)
Subjective: Ready to go home today. Discharge delayed yesterday due to problems with Home Health supply delivery. No complaints.  Objective: Vital signs in last 24 hours: Filed Vitals:   11/07/14 0543 11/07/14 2015 11/07/14 2143 11/08/14 0457  BP: 123/54 143/66 154/74 157/60  Pulse: 79 83 87 78  Temp: 98.5 F (36.9 C) 99.6 F (37.6 C) 98.4 F (36.9 C) 98.6 F (37 C)  TempSrc: Oral Oral Oral Oral  Resp: 18 16 16 16   Height:      Weight:      SpO2: 100% 100% 99% 100%   Weight change:   Intake/Output Summary (Last 24 hours) at 11/08/14 1322 Last data filed at 11/08/14 0935  Gross per 24 hour  Intake    590 ml  Output   2150 ml  Net  -1560 ml   General appearance: alert, cooperative and appears stated age Head: Normocephalic, without obvious abnormality, atraumatic Lungs: clear to auscultation bilaterally Heart: regular rate and rhythm, S1, S2 normal, 2/6 systolic murmur Abdomen: tense at baseline, non-tender throughout except mild TTP epigastric region; bowel sounds normal Extremities: extremities normal, atraumatic, no cyanosis or edema Sacral Ulcer, Ischial Ulcer, R Ankle Ulcer, L heel Ulcer bandaged (see Op note 7/29 for description)  Lab Results: Basic Metabolic Panel:  Recent Labs Lab 11/05/14 0550 11/06/14 0433 11/07/14 0306  NA  --  141 140  K  --  4.3 4.2  CL  --  112* 109  CO2  --  26 25  GLUCOSE  --  96 89  BUN  --  11 10  CREATININE  --  0.87 0.86  CALCIUM  --  8.2* 8.5*  MG 2.3  --   --    Liver Function Tests:  Recent Labs Lab 11/05/14 0455  AST 34  ALT 15  ALKPHOS 49  BILITOT 0.6  PROT 6.6  ALBUMIN 1.7*   No results for input(s): LIPASE, AMYLASE in the last 168 hours. CBC:  Recent Labs Lab 11/06/14 0433 11/07/14 0306  WBC 8.6 9.1  NEUTROABS 6.7  --   HGB 7.8* 8.0*  HCT 24.0* 25.2*  MCV 94.1 92.6  PLT 417* 464*   Anemia Panel:  Recent Labs Lab 11/02/14 1153 11/02/14 1154 11/06/14 0433  VITAMINB12 413  --   --     TIBC  --  185*  --   IRON  --  20*  --   RETICCTPCT  --   --  2.7   Urinalysis: No results for input(s): COLORURINE, LABSPEC, PHURINE, GLUCOSEU, HGBUR, BILIRUBINUR, KETONESUR, PROTEINUR, UROBILINOGEN, NITRITE, LEUKOCYTESUR in the last 168 hours.  Invalid input(s): APPERANCEUR Micro Results: Recent Results (from the past 240 hour(s))  Clostridium Difficile by PCR (not at [facility])     Status: None   Collection Time: 10/31/14  4:17 PM  Result Value Ref Range Status   Toxigenic C Difficile by pcr NEGATIVE NEGATIVE Final  Culture, blood (routine x 2)     Status: None   Collection Time: 11/01/14 12:41 AM  Result Value Ref Range Status   Specimen Description BLOOD LEFT ANTECUBITAL  Final   Special Requests BOTTLES DRAWN AEROBIC AND ANAEROBIC 4CC EA  Final   Culture NO GROWTH 5 DAYS  Final   Report Status 11/06/2014 FINAL  Final  Culture, blood (routine x 2)     Status: None   Collection Time: 11/01/14 12:49 AM  Result Value Ref Range Status   Specimen Description BLOOD LEFT ANTECUBITAL  Final   Special Requests  BOTTLES DRAWN AEROBIC ONLY 4CC  Final   Culture NO GROWTH 5 DAYS  Final   Report Status 11/06/2014 FINAL  Final  Surgical pcr screen     Status: Abnormal   Collection Time: 11/02/14  4:20 AM  Result Value Ref Range Status   MRSA, PCR POSITIVE (A) NEGATIVE Final    Comment: RESULT CALLED TO, READ BACK BY AND VERIFIED WITH: C HUCKBEE,RN [id number] [facility]    Staphylococcus aureus POSITIVE (A) NEGATIVE Final    Comment:        The Xpert SA Assay (FDA approved for NASAL specimens in patients over 89 years of age), is one component of a comprehensive surveillance program.  Test performance has been validated by [facility] for patients greater than or equal to 71 year old. It is not intended to diagnose infection nor to guide or monitor treatment. RESULT CALLED TO, READ BACK BY AND VERIFIED WITHPerry Mount [id number] [facility]   Tissue culture     Status: None    Collection Time: 11/03/14  1:09 PM  Result Value Ref Range Status   Specimen Description TISSUE  Final   Special Requests  RIGHT FIBULA  Final   Gram Stain   Final    NO WBC SEEN NO ORGANISMS SEEN Performed at [facility]    Culture   Final    NO GROWTH 3 DAYS Performed at [facility]    Report Status 11/06/2014 FINAL  Final   Studies/Results: No results found. Medications: I have reviewed the patient's current medications. Scheduled Meds: . anastrozole  1 mg Oral QHS  . Bacitracin 50,000 units, gentamicin 80mg  & cefazolin 1 g in normal saline (1068ml) mastectomy irrigation   Irrigation Once  . baclofen  30 mg Oral BID  . cefTAZidime (FORTAZ)  IV  2 g Intravenous 3 times per day  . DULoxetine  60 mg Oral QPC supper  . enoxaparin (LOVENOX) injection  40 mg Subcutaneous Q24H  . feeding supplement  1 Container Oral TID BM  . folic acid  1 mg Oral QPC supper  . gabapentin  1,600 mg Oral BID  . levothyroxine  125 mcg Oral QAC breakfast  . lidocaine  5 mL Other Once  . metoprolol  100 mg Oral QPC supper  . morphine  60 mg Oral Q12H  . multivitamin with minerals  1 tablet Oral Daily  . NIFEdipine  30 mg Oral Daily  . pantoprazole  40 mg Oral BID  . protein supplement  2 oz Oral QID   Or  . protein supplement  2 oz Oral QID  . sodium hypochlorite   Irrigation TID  . vancomycin  750 mg Intravenous Q12H  . vitamin C  500 mg Oral Daily  . zinc sulfate  220 mg Oral Daily   Continuous Infusions: . lactated ringers     PRN Meds:.acetaminophen, loperamide, oxyCODONE-acetaminophen **AND** oxyCODONE, simethicone, sodium chloride   Assessment/Plan: Principal Problem:   Osteomyelitis Active Problems:   HTN (hypertension)   Breast cancer of lower-inner quadrant of right female breast   Sacral ulcer   Hypothyroidism   Paraplegia   Sacral decubitus ulcer  Abdominal Pain/Diarrhea: Resolved, chronic mild epigastric tenderness which she attributes to muscle  spasms. Afeb. - Loperamide PRN - Simethicone PRN - Protonix po 40mg  qD - Tolerating P.O. Nutrition well   Decubitus Ulcers: s/p debridement 7/29. Op note indicates 5x7x7cm ischial wound, 4.5x6x1cm L heal wound, 8.2x4.5x1cm R lat ankle with exposed bone (culture neg  in the setting of prior abx therapy). WBC normalized. - Appreciate wound care recs:  - Air matress, float heels, foam dressing - Appreciate nutrition recs:  - Increase protein intake, tolerating Unjury protein powder supplement well (21g Prot)  - Multivit, Vit C, Zinc - Appreciate Plastics recs, post-op care:  - Sacrum wound: foam dressing change q week  - L ischium: silver alginate q 2d covered with absorptive dressing  - L heel: silver alginate q 2d covered with kerlix plus heel foam  - R lat ankle: silver alginate q 2d covered with kerlix  - BLE podus boot  - f/u 2 weeks @ [facility] 2 wks - Wound Cx - neg, BCx - neg - Vanc/Ceftaz  -- Plan to continue abx empirically in the setting of exposed bone R ankle  -- Plan 6 wk home infusion  -- PICC in place -- Care Management consult for home health wound care and home infusion  Anemia: Stable. Down from 79 in 09/2014. Fe studies indicate chronic dz. B12/Folate WNL, FOBT neg x2. Possibly due to recent GI bleed now resolved. Hypothyroidism appears well controlled. Possible MDS. H/o breast CA concerning for BM infiltration. Retic Index low at 0.8, indicating hypoproliferation. Polychromic smear without teardrop cells. - SPEP neg for monoclonal proteins - Continue folate suppl - Colonoscopy as outpt  Dyspepsia: Chronic complaint x 3 months. Possible GERD vs H. pylori vs muscle spasm. - Continue PPI as above - f/u H. pylori stool test  HTN: stable - Nifedipine 30 mg - Metoprolol Succ 100 mg   Hypothyroid: stable - Levothyroxine 125 mcg daily  Paraplegia:  - Continue pain control with MS contin, Percocet, Tylenol PRN - Baclofen 20 mg BID - Gabapentin 1600 mg  BID  Depression: - Cymbalta  H/o Breast Cancer: stable. Last mammogram Jan 2015. Followed by Susanne Borders NP ([place]). - Anastrozole 1 mg daily  FEN/GI - PO hydration - Recheck Bmet in AM to follow SCr and K - Albumin corrected Ca WNL - Reg diet + Unjury Protein supplements TID/QID  DVT Ppx: Lovenox 40mg  qD  Dispo: Discharge to home today. Pt. notes that she would like to transfer her primary care to the IMTS here at [facility].    LOS: 7 days   Effie Berkshire, Med Student 11/08/2014, 1:22 PM

## 2014-11-08 NOTE — Care Management (Signed)
DME delivery scheduled between 3 pm and 6 pm . [name] has gone home to wait on equipment , he will call his wife when equipment arrives and [name] will tell her bedside nurse who will call transportation. Magdalen Spatz RN BSN [phone]

## 2014-11-08 NOTE — Progress Notes (Signed)
Discussed discharge summary with patient. Reviewed all medications with patient. Patient received Rx. Patient going home with PICC line and foley catheter. [facility] scheduled to transport patient. Patient ready for discharge.

## 2014-11-08 NOTE — Discharge Summary (Signed)
[name]  MRN: [id number]  PCP: [name], MD  DOB:  1950/06/29       Date of Admission:  10/31/2014  Date of Discharge:  11/08/2014      Attending Physician: Dr. Oval Linsey, MD         DISCHARGE DIAGNOSES: 1. Osteomyelitis, R Ankle 2. Decubitus Pressure Ulcers - Sacrum, R Ankle, L Heel 3. Anemia of Chronic Disease 4. GERD/dyspepsia vs abdominal muscle spasm   DISPOSITION AND FOLLOW-UP: [name] is to follow-up with [name] listed providers as detailed below, at [name]'s visiting, please address following issues:  1. Osteomyelitis, on home infusion of IV antibiotics x 6 weeks (stop date 09/09)   2. Labs / imaging needed at time of follow-up: WBC, Hgb, SCr, K  3. Pending labs / test needing follow-up: Serum Protein Electrophoresis to evaluate for Multiple Myeloma, H. pylori stool antigen  Follow-up Information    Follow up with Irene Limbo, MD. Daphane Shepherd on 11/22/2014.   Specialty:  Plastic Surgery   Why:  @ 8:15 AM, For wound re-check   Contact information:   [address] [place] [address] [phone]       Follow up with Juluis Mire, MD On 11/21/2014.   Specialty:  Internal Medicine   Why:  $Rem'@3'WdUp$ :15 PM, For monitoring of her anemia and hospital follow-up.   Contact information:   [place] [address] [phone]      Discharge Instructions    Call MD for:  difficulty breathing, headache or visual disturbances    Complete by:  As directed      Call MD for:  difficulty breathing, headache or visual disturbances    Complete by:  As directed      Call MD for:  extreme fatigue    Complete by:  As directed      Call MD for:  extreme fatigue    Complete by:  As directed      Call MD for:  hives    Complete by:  As directed      Call MD for:  hives    Complete by:  As directed      Call MD for:  persistant dizziness or light-headedness    Complete by:  As directed      Call MD for:  persistant  dizziness or light-headedness    Complete by:  As directed      Call MD for:  persistant nausea and vomiting    Complete by:  As directed      Call MD for:  persistant nausea and vomiting    Complete by:  As directed      Call MD for:  redness, tenderness, or signs of infection (pain, swelling, redness, odor or green/yellow discharge around incision site)    Complete by:  As directed      Call MD for:  redness, tenderness, or signs of infection (pain, swelling, redness, odor or green/yellow discharge around incision site)    Complete by:  As directed      Call MD for:  severe uncontrolled pain    Complete by:  As directed      Call MD for:  severe uncontrolled pain    Complete by:  As directed      Call MD for:  temperature >100.4    Complete by:  As directed      Call MD for:  temperature >100.4  Complete by:  As directed      Diet - low sodium heart healthy    Complete by:  As directed      Diet - low sodium heart healthy    Complete by:  As directed      Increase activity slowly    Complete by:  As directed      Increase activity slowly    Complete by:  As directed             DISCHARGE MEDICATIONS:   Medication List    TAKE these medications        acetaminophen 500 MG tablet  Commonly known as:  TYLENOL  Take 500 mg by mouth every 6 (six) hours as needed for mild pain or moderate pain.     anastrozole 1 MG tablet  Commonly known as:  ARIMIDEX  Take 1 tablet (1 mg total) by mouth daily.     ascorbic acid 500 MG tablet  Commonly known as:  VITAMIN C  Take 1 tablet (500 mg total) by mouth daily.     baclofen 10 MG tablet  Commonly known as:  LIORESAL  Take 30 mg by mouth 2 (two) times daily.     BEANO MELTAWAYS PO  Take 1 tablet by mouth daily as needed (stomach pain).     cefTAZidime 2 g in dextrose 5 % 50 mL  Inject 2 g into [name] vein every 8 (eight) hours.     DULoxetine 60 MG capsule  Commonly known as:  CYMBALTA  Take 60 mg by mouth daily after  supper.     folic acid 1 MG tablet  Commonly known as:  FOLVITE  Take 1 mg by mouth daily after supper.     furosemide 40 MG tablet  Commonly known as:  LASIX  Take 40 mg by mouth daily after supper.     gabapentin 800 MG tablet  Commonly known as:  NEURONTIN  Take 1,600 mg by mouth 2 (two) times daily.     GAS-X EXTRA STRENGTH 125 MG Caps  Generic drug:  Simethicone  Take 125 mg by mouth daily as needed (stomach pain).     levothyroxine 125 MCG tablet  Commonly known as:  SYNTHROID, LEVOTHROID  Take 1 tablet (125 mcg total) by mouth daily before breakfast.     methocarbamol 500 MG tablet  Commonly known as:  ROBAXIN  Take 1 tablet (500 mg total) by mouth every 6 (six) hours as needed.     metoprolol 100 MG tablet  Commonly known as:  LOPRESSOR  Take 100 mg by mouth daily after supper.     morphine 60 MG 12 hr tablet  Commonly known as:  MS CONTIN  Take 60 mg by mouth every 12 (twelve) hours.     multivitamin with minerals Tabs tablet  Take 1 tablet by mouth daily.     NIFEdipine 30 MG 24 hr tablet  Commonly known as:  PROCARDIA-XL/ADALAT-CC/NIFEDICAL-XL  Take 30 mg by mouth daily.     oxyCODONE-acetaminophen 10-325 MG per tablet  Commonly known as:  PERCOCET  Take 1 tablet by mouth 6 (six) times daily.     pantoprazole 40 MG tablet  Commonly known as:  PROTONIX  Take 1 tablet (40 mg total) by mouth daily.     sodium hypochlorite 0.125 % Soln  Commonly known as:  DAKIN'S 1/4 STRENGTH  Irrigate with as directed 3 (three) times daily. For 3 days. Large wound on thigh and bilateral  feet     Vancomycin 750 MG/150ML Soln  Commonly known as:  VANCOCIN  Inject 150 mLs (750 mg total) into [name] vein every 12 (twelve) hours.     zinc sulfate 220 MG capsule  Take 1 capsule (220 mg total) by mouth daily.         CONSULTS:    Plastic Surgery Wound Care   PROCEDURES PERFORMED:  Ct Abdomen Pelvis W Contrast  11/01/2014   CLINICAL DATA:  Right and left upper  quadrant pain. History of paraplegia with sacral decubitus ulcer.  EXAM: CT ABDOMEN AND PELVIS WITH CONTRAST  TECHNIQUE: Multidetector CT imaging of [name] abdomen and pelvis was performed using [name] standard protocol following bolus administration of intravenous contrast.  CONTRAST:  69mL OMNIPAQUE IOHEXOL 300 MG/ML SOLN, 74mL OMNIPAQUE IOHEXOL 300 MG/ML SOLN  COMPARISON:  None.  FINDINGS: Lower chest:  [name] included lung bases are clear.  Hepatobiliary: [name] liver is prominent in size with decreased density. Subcentimeter 8 mm hypodensity in [name] right hepatic lobe. Gallbladder is physiologically distended. No calcified gallstones. Mild diffuse prominence of [name] common bile duct measuring 9 mm in [name] mid distal portion. No calcified choledocholithiasis.  Pancreas: Mild atrophy without ductal dilatation or surrounding inflammation.  Spleen: Normal in size with scattered splenic granulomas.  Adrenals/Urinary Tract: Asymmetric renal size with right kidney smaller than left and thinning of [name] right renal parenchyma. Bilateral nonobstructing renal calculi. No hydronephrosis or obstructive uropathy. Simple cyst in [name] upper left kidney measures 1.4 cm. No adrenal nodule.  Stomach/Bowel: Stomach is decompressed. There are no dilated or thickened small bowel loops. No obstruction, oral contrast is seen throughout [name] colon. Small volume of colonic stool. [name] appendix is normal.  Vascular/Lymphatic: No retroperitoneal adenopathy. Abdominal aorta is normal in caliber. Calcified and noncalcified atheromatous plaque about [name] abdominal aorta and its branches, no aneurysm. Prominent left inguinal lymph nodes.  Reproductive: Uterus surgically absent.  No adnexal mass.  Bladder: Physiologically distended with questionable bladder wall thickening.  Other: Soft tissue air and induration about [name] posterior medial left upper thigh, partially included. Scattered foci of air in [name] left flank soft tissues. Soft tissue edema posterior to  [name] sacrum.  Musculoskeletal: Multilevel degenerative change throughout [name] lumbar spine. There are no acute or suspicious osseous abnormalities.  IMPRESSION: 1. Soft tissue air and induration about [name] posterior medial left upper thigh, partially included. This is likely related to decubitus ulcer. Scattered air in [name] left flank soft tissues. 2. Bilateral nonobstructing renal calculi. Question of bladder wall thickening, correlation with urinalysis recommended to exclude urinary tract infection. Asymmetric renal size with scarring in [name] right kidney. 3. Prominence of [name] extrahepatic biliary tree without definable cause. No gallbladder distention. 4. Hepatomegaly and hepatic steatosis. 5. Atherosclerosis.   Electronically Signed   By: Jeb Levering M.D.   On: 11/01/2014 04:26       ADMISSION DATA: History of Present Illness: [name] is a 64 yo female with HTN, HLD, hypothyroidism, h/o ER/PR+ breast cancer, chronic pain, and paraplegia, presents with abdominal pain and sacral decubitus ulcer.   She was sent to [name] hospital by her PCP for management of a decubitus ulcer on her right buttock. According to [name]'s daughter, it started at about 1 cm and has grown over [name] course of a month to 4 cm. [name]'s had previously said that he could fist in it. A home health nurse has been visiting 3 times/week for wound care and told [name] and family  that it was not that bad. Per [name] and daughter, [name] nurse has been applying a combination of clorox, baking soda, and saline for treatment. She also has decubitus ulcers to [name] superior gluteal crease, right lateral heel, and left lateral heel.   [name] and her daughter report a 3 week history of worsening gas and abdominal pain. She reports she can feel [name] gas move from her LUQ to RUQ, "under my rib cage," usually forcing [name] to stop what she's doing and catch her breath because [name] pain is too intense. She has been  taking GasX and Beano for 4 days without relief. She has also had a 4 days of loose stools. She is unable to say whether there has been blood or mucus in [name] stool. She has not been eating well for [name] last 4 days. [name] states this is because she has had a foul taste in her mouth for [name] last week. She says it is metallic tasting and does not change with time of day or food.   [name] endorses subjective fever, nausea, and cough. She denies chest pain, SOB, or dysphagia. She is incontinent of urine and stool and cannot tell if she is having dysuria.   HOSPITAL COURSE: 1. Decubitus ulcers with osteomyelitis [name] presented to [name] ED from her PCP due to progression of her decubitus ulcers which were though to be infected. On arrival in [name] ED she was evaluated for systemic infection, and found to have a leukocytosis, but was afebrile and hemodynamically stable. She was started empirically on broad spectrum abx therapy of Vancomycin and Zosyn due to concerns for an intra-abdominal infection, which was ruled out after a CT scan of [name] abdomen and pelvis. [name] antibiotics were continued for empiric coverage of her decubitus ulcers and she was taken to [name] OR by plastic surgery for debridement of [name] wounds on 7/29. [name] operative report notes that there was exposed bone visible on th R ankle ulcer. Ulcer descriptions from [name] op report are below: - 5x7x7cm ischial wound, 4.5x6x1cm L heel wound, 8.2x4.5x1cm R lat ankle [name] wounds have been dressed according to Plastics recommendations as follows: - Sacrum wound: foam dressing change q week - L ischium: silver alginate q 2d covered with absorptive dressing - L heel: silver alginate q 2d covered with kerlix plus heel foam - R lat ankle: silver alginate q 2d covered with kerlix A bone culture was obtain during [name] operation which was negative. However, in [name] setting of 2 days of prior antibiotic therapy and [name] observation of exposed  bone in one of [name] wounds, it was determined to continue with broad spectrum antibiotic therapy for presumed osteomyelitis. A PICC line was placed and home infusion antibiotics were ordered. She will require 6 weeks of antibiotic therapy (OR day 07/29 - 09/09).  She will require lab monitoring (CBC, BMP and vanc trough qweekly) while on prolonged vancomycin.  Home health RN has been arranged.  DME supplies and home PT have been arranged/ordered.  She is scheduled to follow up at wound care clinic in 2 weeks.  [name] has a PCP but voiced interest in switching her care to [facility].  One time OPC follow-up has been arranged on 11/21/14.  At that time, she can decide if she would like to establish care with [facility] or continue with current PCP.  2. Abdominal pain and diarrhea Upon presentation, [name] endorsed significant abdominal pain and complained of several days of diarrhea.  She was found to have a tense abdomen, in conjunction with a leukocytosis, and an abdominal CT was ordered, which was negative. She had been started on broad spectrum antibiotics empirically. Stool testing for C. difficile was negative. She was treated with PPI for suspected GERD and loperamide for diarrheal control to minimize wound contamination. Her leukocytosis had resolved by hospital day #3 and antibiotics were continued for coverage of her ulcers as above. At [name] time of discharge she was only complaining of mild epigasatric pain and was tolerating full diet. Her diarrhea had significantly resolved from 3+ times daily stools to 1 daily stool, although this was still loose. Her continued loose stool was though to be related to antibiotic therapy but not consistent with cdiff infection. Her mild epigastric tenderness was interrogated with a H. Pylori stool test, which is pending at [name] time of discharge.  3. Anemia [name] presented with a Hgb of 9.0. Outside records obtained from [facility] demonstrate that her last known  Hgb was 13.7 on September 15, 2014. Her Hgb down-trended minimally during her hospitalization with a nader of 7.8. At [name] time of discharge her Hgb was stable ~8.0. Her anemia is normocytic and hypoproliferative with a reticulocyte index of 0.8. This was interrogated with 2 stool guaiac tests which was negative. She denied any history of melanotic stools or hematemisis. Iron studies demonstrated an elevated ferritin, low Fe, low Fe saturation, and low TIBC, all of which are consistent with anemia of chronic inflammation.  Although, ferritin is an acute phase reactant which could be elevated due to acute illness.  Her B12 and Folate levels were within normal limits. Of note, she takes daily folate supplementation. Given her history of breast cancer, a blood smear was obtained to investigate possible bone marrow infiltration, but this was benign demonstrating only polychromasia. A serum protein electrophoresis study was obtained to evaluate for possible multiple myeloma, and was pending at [name] time of discharge. She was instructed to follow up with her PCP for monitoring and further investigation of her anemia.   DISCHARGE DATA: Vital Signs: BP 128/69 mmHg  Pulse 92  Temp(Src) 98.6 F (37 C) (Oral)  Resp 16  Ht $R'5\' 3"'nu$  (1.6 m)  Wt 170 lb (77.111 kg)  BMI 30.12 kg/m2  SpO2 100%   Services Ordered on Discharge: Y = Yes; Blank = No PT: Y  OT:   RN: Y  Equipment: Air overlay mattress, hospital bed, ROHO cushion, long sliding board, WC and cushion  Other:      Time Spent on Discharge: 35 min   Signed: Duwaine Maxin DO PGY 3, Internal Medicine Resident 11/08/2014, 6:08 PM

## 2014-11-08 NOTE — Progress Notes (Signed)
CSW informed of pt need for [facility] transport  Patient will discharge to home Anticipated discharge date:11/08/14 Family notified:at bedside Transportation by [facility]- RN to call when ready or will inform CSW to call  CSW signing off.  Domenica Reamer, [place] Social Worker [phone]

## 2014-11-08 NOTE — Progress Notes (Signed)
Internal Medicine Attending  Date: 11/08/2014  Patient name: [name] Medical record number: [id number] Date of birth: 1950-06-24 Age: 64 y.o. Gender: female  I saw and evaluated the patient. I reviewed the resident's note by Dr. Redmond Pulling and I agree with the resident's findings and plans as documented in her progress note.  DME delivery has been scheduled for between 3 and 6 PM and [name] husband will be there. Once the equipment has arrived she will be discharged to home to complete the course of antibiotic therapy for her osteomyelitis.

## 2014-11-08 NOTE — Progress Notes (Signed)
Subjective: [name] was seen and examined this AM.  She is feeling ready to go home but wants to ensure her DME will be there before she arrives.    Objective: Vital signs in last 24 hours: Filed Vitals:   11/07/14 0543 11/07/14 2015 11/07/14 2143 11/08/14 0457  BP: 123/54 143/66 154/74 157/60  Pulse: 79 83 87 78  Temp: 98.5 F (36.9 C) 99.6 F (37.6 C) 98.4 F (36.9 C) 98.6 F (37 C)  TempSrc: Oral Oral Oral Oral  Resp: 18 16 16 16   Height:      Weight:      SpO2: 100% 100% 99% 100%   Weight change:   Intake/Output Summary (Last 24 hours) at 11/08/14 0805 Last data filed at 11/08/14 0456  Gross per 24 hour  Intake    240 ml  Output   2150 ml  Net  -1910 ml   General: resting in bed in NAD, preparing to eat breakfast HEENT: [place]/AT Cardiac: RRR, no rubs, murmurs or gallops Pulm: clear to auscultation bilaterally, moving normal volumes of air Abd: BS present Ext: warm and well perfused, no pedal edema, feet wrapped and in pressure reducing boots, 2+ DPs B/L Neuro: alert and oriented X3,  Responding appropriately  Micro Results: tissue culture, right fibula - no growth, final  Medications: I have reviewed the patient's current medications. Scheduled Meds: . anastrozole  1 mg Oral QHS  . Bacitracin 50,000 units, gentamicin 80mg  & cefazolin 1 g in normal saline (1039ml) mastectomy irrigation   Irrigation Once  . baclofen  30 mg Oral BID  . cefTAZidime (FORTAZ)  IV  2 g Intravenous 3 times per day  . DULoxetine  60 mg Oral QPC supper  . enoxaparin (LOVENOX) injection  40 mg Subcutaneous Q24H  . feeding supplement  1 Container Oral TID BM  . folic acid  1 mg Oral QPC supper  . gabapentin  1,600 mg Oral BID  . levothyroxine  125 mcg Oral QAC breakfast  . lidocaine  5 mL Other Once  . metoprolol  100 mg Oral QPC supper  . morphine  60 mg Oral Q12H  . multivitamin with minerals  1 tablet Oral Daily  . NIFEdipine  30 mg Oral Daily  . pantoprazole  40 mg Oral BID  .  protein supplement  2 oz Oral QID   Or  . protein supplement  2 oz Oral QID  . sodium hypochlorite   Irrigation TID  . vancomycin  750 mg Intravenous Q12H  . vitamin C  500 mg Oral Daily  . zinc sulfate  220 mg Oral Daily   Continuous Infusions: . lactated ringers     PRN Meds:.acetaminophen, loperamide, oxyCODONE-acetaminophen **AND** oxyCODONE, simethicone, sodium chloride   Assessment/Plan: Osteomyelitis:  Right fib/ankle. - d/c home to continue vanc and ceftazidime (via PICC) x 6 weeks - home health orders placed for [facility] RN for abx teaching and wound care - she will follow-up in our clinic for one time visit on 08/16; she will then decide if she would like to transfer care from current PCP to [facility].  Sacral ulcer:  Appreciate plastic surgery and wound care assistance with this patient.  Patient is s/p debridemend on 07/29. - [place] orders placed for wound care - she will follow-up at wound center on 08/17 - air mattress ordered for use at home  Paralegia:  Continue home medications.  She is comfortable returning home and husband is primary caretaker.  Anemia:  Likely AoCD  based on anemia panel.  Stable.  No gross blood loss noted.  FOBT neg.   - SPEP pending, will follow-up result at hospital follow-up - follow-up CBC as outpatient  HTN:  Stable on home meds, metoprolol, procardia  Abd pain/diarrhea:  Resolved.  Cdiff neg.    Dispo: Plan for d/c home today with home health RN and PT ordered.  Follow-up arranged as above.  The patient does have a current PCP [name], MD) and does not need an [facility] hospital follow-up appointment after discharge.  The patient does not know have transportation limitations that hinder transportation to clinic appointments.  .Services Needed at time of discharge: Y = Yes, Blank = No PT: Y  OT:   RN: Y  Equipment: Air overlay mattress, hospital bed, ROHO cushion, long sliding board, WC and cushion  Other:     LOS: 7 days   Francesca Oman,  DO 11/08/2014, 8:05 AM

## 2014-11-08 NOTE — Care Management (Addendum)
Nishka Heide is at patient's bedside . [facility] is going to call him on patient's hospital phone to arrange delivery .  Mr Morel aware.   Magdalen Spatz RN BSN [phone]

## 2014-11-21 ENCOUNTER — Ambulatory Visit: Payer: Medicare HMO | Admitting: Internal Medicine

## 2014-11-22 ENCOUNTER — Encounter (HOSPITAL_BASED_OUTPATIENT_CLINIC_OR_DEPARTMENT_OTHER): Payer: Medicare Other | Attending: Surgery

## 2014-11-22 DIAGNOSIS — L89514 Pressure ulcer of right ankle, stage 4: Secondary | ICD-10-CM | POA: Insufficient documentation

## 2014-11-22 DIAGNOSIS — M1990 Unspecified osteoarthritis, unspecified site: Secondary | ICD-10-CM | POA: Diagnosis not present

## 2014-11-22 DIAGNOSIS — F0390 Unspecified dementia without behavioral disturbance: Secondary | ICD-10-CM | POA: Diagnosis not present

## 2014-11-22 DIAGNOSIS — I10 Essential (primary) hypertension: Secondary | ICD-10-CM | POA: Insufficient documentation

## 2014-11-22 DIAGNOSIS — L89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, stage 4: Secondary | ICD-10-CM | POA: Diagnosis present

## 2014-11-22 DIAGNOSIS — E039 Hypothyroidism, unspecified: Secondary | ICD-10-CM | POA: Insufficient documentation

## 2014-11-22 DIAGNOSIS — L89624 Pressure ulcer of left heel, stage 4: Secondary | ICD-10-CM | POA: Insufficient documentation

## 2014-11-22 DIAGNOSIS — G8221 Paraplegia, complete: Secondary | ICD-10-CM | POA: Insufficient documentation

## 2014-11-29 ENCOUNTER — Ambulatory Visit (HOSPITAL_COMMUNITY)
Admission: RE | Admit: 2014-11-29 | Discharge: 2014-11-29 | Disposition: A | Discharge status: Home / Self Care | Payer: Medicare Other | Source: Ambulatory Visit | Attending: Vascular Surgery | Admitting: Vascular Surgery

## 2014-11-29 ENCOUNTER — Other Ambulatory Visit: Payer: Self-pay | Admitting: Surgery

## 2014-11-29 DIAGNOSIS — L97929 Non-pressure chronic ulcer of unspecified part of left lower leg with unspecified severity: Principal | ICD-10-CM

## 2014-11-29 DIAGNOSIS — L97919 Non-pressure chronic ulcer of unspecified part of right lower leg with unspecified severity: Secondary | ICD-10-CM | POA: Diagnosis not present

## 2014-11-29 DIAGNOSIS — L89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, stage 4: Secondary | ICD-10-CM | POA: Diagnosis not present

## 2014-12-13 ENCOUNTER — Telehealth: Payer: Self-pay | Admitting: *Deleted

## 2014-12-13 ENCOUNTER — Encounter (HOSPITAL_BASED_OUTPATIENT_CLINIC_OR_DEPARTMENT_OTHER): Payer: Medicare Other | Attending: Surgery

## 2014-12-13 DIAGNOSIS — L89624 Pressure ulcer of left heel, stage 4: Secondary | ICD-10-CM | POA: Diagnosis not present

## 2014-12-13 DIAGNOSIS — E039 Hypothyroidism, unspecified: Secondary | ICD-10-CM | POA: Diagnosis not present

## 2014-12-13 DIAGNOSIS — I10 Essential (primary) hypertension: Secondary | ICD-10-CM | POA: Diagnosis not present

## 2014-12-13 DIAGNOSIS — M1990 Unspecified osteoarthritis, unspecified site: Secondary | ICD-10-CM | POA: Diagnosis not present

## 2014-12-13 DIAGNOSIS — D649 Anemia, unspecified: Secondary | ICD-10-CM | POA: Diagnosis not present

## 2014-12-13 DIAGNOSIS — L89514 Pressure ulcer of right ankle, stage 4: Secondary | ICD-10-CM | POA: Diagnosis not present

## 2014-12-13 DIAGNOSIS — G8221 Paraplegia, complete: Secondary | ICD-10-CM | POA: Diagnosis not present

## 2014-12-13 DIAGNOSIS — F17210 Nicotine dependence, cigarettes, uncomplicated: Secondary | ICD-10-CM | POA: Diagnosis not present

## 2014-12-13 DIAGNOSIS — F0390 Unspecified dementia without behavioral disturbance: Secondary | ICD-10-CM | POA: Diagnosis not present

## 2014-12-13 DIAGNOSIS — L89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, stage 4: Secondary | ICD-10-CM | POA: Insufficient documentation

## 2014-12-13 DIAGNOSIS — Z853 Personal history of malignant neoplasm of breast: Secondary | ICD-10-CM | POA: Diagnosis not present

## 2014-12-13 NOTE — Telephone Encounter (Signed)
Call from pt- Pt states she goes to [facility] and sees Dr. [name] [phone].  Will forward copy of labs to their office, pt also consented to have her discharge summary faxed along with her lab results.  Pt has an appt with DrSun (scheduled prior to her hospitalization) for Sept 19th.  Pt was scheduled for a HFU in our office on 08/16//16 but had to cancel 2/2 lack of transportation.  She uses the ambulance and she stated she couldn't afford the trip here. Pt encouraged to keep upcoming appt with DrSun.Despina Hidden Cassady9/7/20164:46 PM

## 2014-12-13 NOTE — Telephone Encounter (Signed)
Call made to [facility]-Pt was recently seen and discharged from the hospital by [facility].  Lab results obtained by [facility] was sent to our office.  Labs were reviewed by attending md, who noted that results should be sent to pt's pcp [facility] ) for follow-up.  Contacted D.rSun's office at [phone] to obtain fax number and was informed patient does not go to this office.  Attempted to contact patient at number in Epic, no answer, message left on recorder.[name], [name] Wimmer Cassady9/7/201610:28 AM

## 2014-12-18 ENCOUNTER — Telehealth: Payer: Self-pay | Admitting: Internal Medicine

## 2014-12-18 NOTE — Telephone Encounter (Signed)
Calling to get okay from Dr Eppie Gibson to pull the Pick line from patient since he was the one or order it originally.

## 2014-12-19 NOTE — Telephone Encounter (Signed)
OK to discontinue the PICC line now that her antibiotic course is complete.  From this point forward all inquiries about [name] need to be directed to her Primary Care Provider who is not within the Internal Medicine Center.  Thank You.

## 2014-12-19 NOTE — Telephone Encounter (Signed)
Called [name] and given Dr Eppie Gibson message about PICC line and future orders.

## 2014-12-20 DIAGNOSIS — L89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, stage 4: Secondary | ICD-10-CM | POA: Diagnosis not present

## 2014-12-27 DIAGNOSIS — L89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, stage 4: Secondary | ICD-10-CM | POA: Diagnosis not present

## 2015-01-03 DIAGNOSIS — L89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, stage 4: Secondary | ICD-10-CM | POA: Diagnosis not present

## 2015-01-11 ENCOUNTER — Other Ambulatory Visit: Payer: Medicare HMO

## 2015-01-11 ENCOUNTER — Ambulatory Visit: Payer: Medicare HMO | Admitting: Oncology

## 2015-01-15 ENCOUNTER — Other Ambulatory Visit: Payer: Self-pay | Admitting: Oncology

## 2015-01-16 ENCOUNTER — Telehealth: Payer: Self-pay | Admitting: Oncology

## 2015-01-16 NOTE — Telephone Encounter (Signed)
patient called in to reschedule her appointments as she has a wound vav on her bottom and cannot get here

## 2015-01-18 ENCOUNTER — Ambulatory Visit: Payer: Medicare Other | Admitting: Rehabilitative and Restorative Service Providers"

## 2015-01-18 ENCOUNTER — Ambulatory Visit: Payer: Medicare HMO | Admitting: Oncology

## 2015-01-18 ENCOUNTER — Other Ambulatory Visit: Payer: Medicare HMO

## 2015-01-24 ENCOUNTER — Encounter (HOSPITAL_BASED_OUTPATIENT_CLINIC_OR_DEPARTMENT_OTHER): Payer: Medicare Other | Attending: Surgery

## 2015-01-24 DIAGNOSIS — I10 Essential (primary) hypertension: Secondary | ICD-10-CM | POA: Diagnosis not present

## 2015-01-24 DIAGNOSIS — L89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, stage 4: Secondary | ICD-10-CM | POA: Diagnosis not present

## 2015-01-24 DIAGNOSIS — L89524 Pressure ulcer of left ankle, stage 4: Secondary | ICD-10-CM | POA: Insufficient documentation

## 2015-01-24 DIAGNOSIS — Z853 Personal history of malignant neoplasm of breast: Secondary | ICD-10-CM | POA: Insufficient documentation

## 2015-01-24 DIAGNOSIS — L89514 Pressure ulcer of right ankle, stage 4: Secondary | ICD-10-CM | POA: Insufficient documentation

## 2015-01-24 DIAGNOSIS — E039 Hypothyroidism, unspecified: Secondary | ICD-10-CM | POA: Insufficient documentation

## 2015-01-24 DIAGNOSIS — Z87891 Personal history of nicotine dependence: Secondary | ICD-10-CM | POA: Insufficient documentation

## 2015-01-24 DIAGNOSIS — G894 Chronic pain syndrome: Secondary | ICD-10-CM | POA: Insufficient documentation

## 2015-01-24 DIAGNOSIS — G8220 Paraplegia, unspecified: Secondary | ICD-10-CM | POA: Insufficient documentation

## 2015-02-14 ENCOUNTER — Encounter (HOSPITAL_BASED_OUTPATIENT_CLINIC_OR_DEPARTMENT_OTHER): Payer: Medicare Other | Attending: Surgery

## 2015-02-14 DIAGNOSIS — L89622 Pressure ulcer of left heel, stage 2: Secondary | ICD-10-CM | POA: Diagnosis not present

## 2015-02-14 DIAGNOSIS — F0390 Unspecified dementia without behavioral disturbance: Secondary | ICD-10-CM | POA: Diagnosis not present

## 2015-02-14 DIAGNOSIS — L89624 Pressure ulcer of left heel, stage 4: Secondary | ICD-10-CM | POA: Insufficient documentation

## 2015-02-14 DIAGNOSIS — L89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, stage 4: Secondary | ICD-10-CM | POA: Insufficient documentation

## 2015-02-14 DIAGNOSIS — G8221 Paraplegia, complete: Secondary | ICD-10-CM | POA: Diagnosis not present

## 2015-02-14 DIAGNOSIS — L8942 Pressure ulcer of contiguous site of back, buttock and hip, stage 2: Secondary | ICD-10-CM | POA: Diagnosis not present

## 2015-02-14 DIAGNOSIS — M1990 Unspecified osteoarthritis, unspecified site: Secondary | ICD-10-CM | POA: Insufficient documentation

## 2015-02-14 DIAGNOSIS — Z923 Personal history of irradiation: Secondary | ICD-10-CM | POA: Diagnosis not present

## 2015-02-14 DIAGNOSIS — I10 Essential (primary) hypertension: Secondary | ICD-10-CM | POA: Insufficient documentation

## 2015-02-14 DIAGNOSIS — L89514 Pressure ulcer of right ankle, stage 4: Secondary | ICD-10-CM | POA: Diagnosis not present

## 2015-02-22 ENCOUNTER — Ambulatory Visit: Payer: Medicare Other | Attending: Internal Medicine | Admitting: Physical Therapy

## 2015-02-22 ENCOUNTER — Encounter: Payer: Self-pay | Admitting: Physical Therapy

## 2015-02-22 DIAGNOSIS — R2991 Unspecified symptoms and signs involving the musculoskeletal system: Secondary | ICD-10-CM | POA: Diagnosis present

## 2015-02-22 DIAGNOSIS — G8220 Paraplegia, unspecified: Secondary | ICD-10-CM | POA: Diagnosis present

## 2015-02-22 DIAGNOSIS — Z7409 Other reduced mobility: Secondary | ICD-10-CM

## 2015-02-22 NOTE — Therapy (Signed)
[facility] [address] [place], [place], [address] Phone: [phone]   Fax:  [phone]  Physical Therapy Evaluation  Patient Details  Name: [name] MRN: [id number] Date of Birth: 1950/06/25 Referring Provider: Salli Real, MD  Encounter Date: 02/22/2015      PT End of Session - 02/22/15 1440    Visit Number 1   Number of Visits 1   PT Start Time 1300   PT Stop Time 1435   PT Time Calculation (min) 95 min   Activity Tolerance Patient tolerated treatment well   Behavior During Therapy [facility] for tasks assessed/performed      Past Medical History  Diagnosis Date  . Hypertension   . Difficult intubation     Fastrach #4 LMA then # 7 ETT used in 2006 [facility], cervical laminectomy)  . Hypothyroidism   . Cervical neuropathy     PERIPHERAL NEUROPATHY , HAD CERVICAL FUSION  . PONV (postoperative nausea and vomiting)   . Hypercholesteremia   . Arthritis     "all over" (04/13/2012)  . Breast cancer [facility]) 2011    "right" (04/13/2012)  . Sacral decubitus ulcer 10/2014  . Kidney stones 1990's    "twice; passed in hospital on my own" (04/13/2012)    Past Surgical History  Procedure Laterality Date  . [place] luc  ~ 2003    "benign tumor removed from up under gum" (04/13/2012)  . Posterior cervical laminectomy  2006  . Eye surgery  2004    STENTS TO BIL EYES  . Tonsillectomy and adenoidectomy  ~ 1954  . Abdominal hysterectomy  ~ 1976  . Breast lumpectomy  2011    "right" (04/13/2012)  . Dacrocystorhinostomy  ~ 2000    "put stents in my tear ducts; both eyes" (04/13/2012)  . Total knee arthroplasty with revision components  04/13/2012    Procedure: TOTAL KNEE ARTHROPLASTY WITH REVISION COMPONENTS;  Surgeon: Velna Ochs, MD;  Location: [facility] OR;  Service: Orthopedics;  Laterality: Left;  . Incision and drainage of wound Left 11/03/2014    Procedure: IRRIGATION AND DEBRIDEMENT SACRAL ULCER;  Surgeon: Glenna Fellows,  MD;  Location: [facility] OR;  Service: Plastics;  Laterality: Left;  . I&d extremity Bilateral 11/03/2014    Procedure: IRRIGATION AND DEBRIDEMENT BILATERAL HEEL;  Surgeon: Glenna Fellows, MD;  Location: [facility] OR;  Service: Plastics;  Laterality: Bilateral;    There were no vitals filed for this visit.  Visit Diagnosis:  Impaired transfers  Paraplegia [facility])      Subjective Assessment - 02/22/15 1300    Subjective Patient presents in stretcher with ambulance transport for power w/c evaluation with PT.    Patient is accompained by: Family member            [facility] PT Assessment - 02/22/15 1300    Assessment   Referring Provider Salli Real, MD       Mobility/Seating Evaluation    PATIENT INFORMATION: Name: [name] DOB: [birth year]  Sex: Female Date seen: 02/22/2015 Time: 13:00  Address:  [address] [place], [place] [address] Physician: Salli Real, MD This evaluation/justification form will serve as the LMN for the following suppliers: __________________________ Supplier: [employer] Contact Person: Melrose Nakayama, ATP Phone:  [phone]   Seating Therapist: Vladimir Faster, PT, DPT Phone:   [phone]   Phone: [phone]    Spouse/Parent/Caregiver name: Corlis Leak, husband   Phone number: [phone] Insurance/Payer: [facility] Medicare     Reason for Referral: Power wheelchair evaluations  Patient/Caregiver Goals: receive a power w/c to get around house independently.   Patient was seen for face-to-face evaluation for new power wheelchair.  Also present was [employer], husband, Babs Bertin, daughter and Luz Brazen, ATP to discuss recommendations and wheelchair options.  Further paperwork was completed and sent to vendor.  Patient appears to qualify for power mobility device at this time per objective findings.   MEDICAL HISTORY: Diagnosis: Primary Diagnosis: Cervical Myelopathy, Paraplegia 2* to DDD & DJD of spine Onset: 2015 paraplegia Diagnosis: Breast CA, HTN,  osteoarthritis   [] Progressive Disease Relevant past and future surgeries: Total Knee Replacement 04-13-2012, I & D for osteomyelitis sacral wound   Height: 5'4" Weight: 180# Explain recent changes or trends in weight: ?????   History including Falls: None in last year    HOME ENVIRONMENT: [x] House  [] Condo/town home  [] Apartment  [] Assisted Living    [] Lives Alone [x]  Lives with Others                                                                                          Hours with caregiver: 24 hrs/day  [x] Home is accessible to patient           Stairs      [x] Yes []  No     Ramp [] Yes [x] No Comments:  She lives with her husband & mental disabled (alternative family living) in single level house with single step from ground to porch and single step/ threshold from porch to house.     COMMUNITY ADL: TRANSPORTATION: [] Car    [x] Van    [x] Public Transportation    [] Adapted w/c Lift    [] Ambulance    [] Other:       [x] Sits in wheelchair during transport  Employment/School: ????? Specific requirements pertaining to mobility ?????  Other: ?????    FUNCTIONAL/SENSORY PROCESSING SKILLS:  Handedness:   [x] Right     [] Left    [] NA  Comments:  ?????  Functional Processing Skills for Wheeled Mobility [x] Processing Skills are adequate for safe wheelchair operation  Areas of concern than may interfere with safe operation of wheelchair Description of problem   []  Attention to environment      [] Judgment      []  Hearing  []  Vision or visual processing      [] Motor Planning  []  Fluctuations in Behavior  ?????    VERBAL COMMUNICATION: [x] WFL receptive [x]  WFL expressive [x] Understandable  [] Difficult to understand  [] non-communicative []  Uses an augmented communication device  CURRENT SEATING / MOBILITY: Current Mobility Base:  [] None [] Dependent [x] Manual [] Scooter [] Power  Type of Control: ?????  Manufacturer:  DriveSize:  ?????Age: ?????  Current Condition of Mobility Base:  came by ambulance  in stretcher so manual w/c not available for assessment.   Current Wheelchair components:  ?????  Describe posture in present seating system:  ?????      SENSATION and SKIN ISSUES: Sensation [] Intact  [] Impaired [x] Absent  Level of sensation: T7 distal Pressure Relief: Able to perform effective pressure relief :    [] Yes  [x]  No Method: ???? If not, Why?: UE too weak to boost,   Skin Issues/Skin Integrity  Current Skin Issues  [x] Yes [] No [] Intact []  Red area[x]  Open Area  [] Scar Tissue [x] At risk from prolonged sitting Where  Sacral wound on left buttock, right lateral ankle, left heel wounds  History of Skin Issues  [x] Yes [] No Where  sacral wound in coccyx,  When  Spring 2016  Hx of skin flap surgeries  [] Yes [x] No Where  ????? When  ?????  Limited sitting tolerance [x] Yes [] No Hours spent sitting in wheelchair daily: Wound doctor reports sits upright only 1 hr at time   Complaint of Pain:  Please describe: hands get numb and ankle left 4/10 (wound care is 8/10) Medications decrease pain.    Swelling/Edema: swelling of feet Non-pitting   ADL STATUS (in reference to wheelchair use):  Indep Assist Unable Indep with Equip Not assessed Comments  Dressing ????? X ????? ????? ????? Laying in bed, she can put shirt on. Her husband dressed lower body.   Eating X ????? ????? ????? ????? Sitting in chair  Toileting ????? ????? X ????? ????? Bowel movements on bed pad & husband changes  Bathing ????? X ????? ????? ????? Bedside, she bathes waist up in front; husband bathes back & lower body  Grooming/Hygiene ????? ????? X ????? ????? dependent with hair care, independent with toothbrushing once set-up  Meal Prep ????? ????? X ????? ????? ?????  IADLS X ????? ????? ????? ????? ?????  Bowel Management: [] Continent  [x] Incontinent  [x] Accidents Comments:  Uses bed pads & husband changes  Bladder Management: [] Continent  [x] Incontinent  [] Accidents Comments:  uses catheter with nursing  changing     WHEELCHAIR SKILLS: Manual w/c Propulsion: [] UE or LE strength and endurance sufficient to participate in ADLs using manual wheelchair Arm : [] left [] right   [] Both      Distance: Unable to propel w/c Foot:  [] left [] right   [] Both  Operate Scooter: []  Strength, hand grip, balance and transfer appropriate for use [] Living environment is accessible for use of scooter  Operate Power w/c:  [x]  Std. Joystick   []  Alternative Controls Indep [x]  Assist []  Dependent/unable []  N/A []   [x] Safe          []  Functional      Distance: ?????  Bed confined without wheelchair [x]  Yes []  No   STRENGTH/RANGE OF MOTION:  Passive Range of Motion Strength  Shoulder left 110*, right 100* flexion 4/5 flexion and abduction bil.   Elbow WFL  5/5  Wrist/Hand WFL grip right 20#, left 16#  Hip flexion right 100*, left 90* 0/5  Knee flexion 90* bil. 0/5  Ankle dorsiflexion right -20*, left -5* 0/5     MOBILITY/BALANCE:  [x]  Patient is totally dependent for mobility  ?????    Balance Transfers Ambulation  Sitting Balance: Standing Balance: []  Independent []  Independent/Modified Independent  []  WFL     []  WFL []  Supervision []  Supervision  []  Uses UE for balance  []  Supervision []  Min Assist []  Ambulates with Assist  ?????    []  Min Assist []  Min assist []  Mod Assist []  Ambulates with Device:      []  RW  []  StW  []  Cane  []  ?????  []  Mod Assist []  Mod assist []  Max assist   []  Max Assist []  Max assist [x]  Dependent []  Indep. Short Distance Only  [x]  Unable [x]  Unable [x]  Lift / Sling Required Distance (in feet)  ?????   []  Sliding board [x]  Unable to Ambulate (see explanation below)  Cardio Status:  [x] Intact  []  Impaired   []   NA     ?????  Respiratory Status:  [x] Intact   [] Impaired   [] NA     ?????  Orthotics/Prosthetics: none  Comments (Address manual vs power w/c vs scooter): total assit to scoot laterally from stretcher to raised mat table. She initiates rollling of shoulders only.           Anterior / Posterior Obliquity Rotation-Pelvis ?????  PELVIS    []  [x]  []   Neutral Posterior Anterior  []  []  [x]   WFL Rt elev Lt elev  []  [x]  []   WFL Right Left                      Anterior    Anterior     [x]  Fixed []  Other []  Partly Flexible []  Flexible   [x]  Fixed []  Other []  Partly Flexible  []  Flexible  [x]  Fixed []  Other []  Partly Flexible  []  Flexible   TRUNK  []  [x]  []   WFL ? Thoracic ? Lumbar  Kyphosis Lordosis  []  [x]  []   WFL Convex Convex  Right Left [] c-curve [] s-curve [] multiple  []  Neutral []  Left-anterior [x]  Right-anterior     [x]  Fixed []  Flexible []  Partly Flexible []  Other  []  Fixed []  Flexible [x]  Partly Flexible []  Other  []  Fixed             []  Flexible [x]  Partly Flexible []  Other    Position Windswept  right hip external rotation can be moved to neutral with support, abduction can position in neutral with support  HIPS          []            [x]               []    Neutral       Abduct        ADduct         [x]           []            []   Neutral Right           Left      []  Fixed []  Subluxed [x]  Partly Flexible []  Dislocated []  Flexible  []  Fixed []  Other []  Partly Flexible  []  Flexible                 Foot Positioning Knee Positioning  ?????    [x]  WFL  [] Lt [] Rt [x]  WFL  [] Lt [] Rt    KNEES ROM concerns: ROM concerns:    & Dorsi-Flexed [] Lt [] Rt ?????    FEET Plantar Flexed [x] Lt [x] Rt      Inversion                 [] Lt [] Rt      Eversion                 [] Lt [] Rt     HEAD []  Functional []  Good Head Control  ?????  & [x]  Flexed         []  Extended []  Adequate Head Control    NECK []  Rotated  Lt  []  Lat Flexed Lt [x]  Rotated  Rt [x]  Lat Flexed Rt [x]  Limited Head Control     []  Cervical Hyperextension []  Absent  Head Control     SHOULDERS ELBOWS WRIST& HAND ?????      Left     Right    Left     Right    Left  Right   U/E [x] Functional           [x] Functional ????? ????? [] Fisting             [] Fisting      [] elev    [] dep      [] elev   [] dep       [] pro -[] retract     [] pro  [] retract [] subluxed             [] subluxed           Goals for Wheelchair Mobility  [x]  Independence with mobility in the home with motor related ADLs (MRADLs)  [x]  Independence with MRADLs in the community [x]  Provide dependent mobility  [x]  Provide recline     [x] Provide tilt   Goals for Seating system [x]  Optimize pressure distribution [x]  Provide support needed to facilitate function or safety [x]  Provide corrective forces to assist with maintaining or improving posture [x]  Accommodate client's posture:   current seated postures and positions are not flexible or will not tolerate corrective forces [x]  Client to be independent with relieving pressure in the wheelchair [x] Enhance physiological function such as breathing, swallowing, digestion  Simulation ideas/Equipment trials:ATP is going take power w/c to home during assessment State why other equipment was unsuccessful:?????   MOBILITY BASE RECOMMENDATIONS and JUSTIFICATION: MOBILITY COMPONENT JUSTIFICATION  Manufacturer: QuantumModel: Q6 edge 2.0   Size: Width 20"Seat Depth 20" [x] provide transport from point A to B      [x] promote Indep mobility  [x] is not a safe, functional ambulator [x] walker or cane inadequate [x] non-standard width/depth necessary to accommodate anatomical measurement []  ?????  [] Manual Mobility Base [] non-functional ambulator    [] Scooter/POV  [] can safely operate  [] can safely transfer   [] has adequate trunk stability  [] cannot functionally propel manual w/c  [x] Power Mobility Base  [x] non-ambulatory  [x] cannot functionally propel manual wheelchair  [x]  cannot functionally and safely operate scooter/POV [x] can safely operate and willing to  [] Stroller Base [] infant/child  [] unable to propel manual wheelchair [] allows for growth [] non-functional ambulator [] non-functional UE [] Indep mobility is not a goal at this time  [x] Tilt   [] Forward [x] Backward [x] Powered tilt  [] Manual tilt  [x] change position against gravitational force on head and shoulders  [x] change position for pressure relief/cannot weight shift [x] transfers  [] management of tone [x] rest periods [] control edema [x] facilitate postural control  [x]  position changes for pressure relief for wounds  [x] Recline  [x] Power recline on power base [] Manual recline on manual base  [x] accommodate femur to back angle  [x] bring to full recline for ADL care  [x] change position for pressure relief/cannot weight shift [x] rest periods [x] repositioning for transfers or clothing/diaper /catheter changes [x] head positioning  [] Lighter weight required [] self- propulsion  [] lifting []  ?????  [] Heavy Duty required [] user weight greater than 250# [] extreme tone/ over active movement [] broken frame on previous chair []  ?????  [x]  Back  []  Angle Adjustable []  Custom molded J3 6" deep contour [x] postural control [] control of tone/spasticity [x] accommodation of range of motion [x] UE functional control [x] accommodation for seating system []  ????? [x] provide lateral trunk support [x] accommodate deformity [x] provide posterior trunk support [x] provide lumbar/sacral support [x] support trunk in midline [x] Pressure relief over spinal processes  [x]  Seat Cushion Roho high profile [x] impaired sensation  [x] decubitus ulcers present [x] history of pressure ulceration [x] prevent pelvic extension [] low maintenance  [x] stabilize pelvis  [x] accommodate obliquity [] accommodate multiple deformity [x] neutralize lower extremity position [x] increase pressure distribution []  ?????  [x]  Pelvic/thigh support  [x]  Lateral thigh guide []  Distal medial pad  []  Distal lateral pad [x]  pelvis in  neutral [] accommodate pelvis [x]  position upper legs [x]  alignment []  accommodate ROM []  decr adduction [] accommodate tone [x] removable for transfers [x] decr abduction  []  Lateral trunk  Supports []  Lt     []  Rt [] decrease lateral trunk leaning [] control tone [] contour for increased contact [] safety  [] accommodate asymmetry []  ?????  [x]  Mounting hardware  [] lateral trunk supports  [x] back   [] seat [x] headrest      [x]  thigh support [] fixed   [x] swing away [] attach seat platform/cushion to w/c frame [x] attach back cushion to w/c frame [x] mount postural supports [x] mount headrest  [] swing medial thigh support away [x] swing lateral supports away for transfers  []  ?????    Armrests  [] fixed [x] adjustable height [x] removable   [] swing away  [x] flip back   [] reclining [x] full length pads [] desk    [] pads tubular  [x] provide support with elbow at 90   [x] provide support for w/c tray [x] change of height/angles for variable activities [x] remove for transfers [x] allow to come closer to table top [x] remove for access to tables []  ?????  Hangers/ Leg rests  [] 60 [] 70 [] 90 [] elevating [] heavy duty  [] articulating [] fixed [] lift off [] swing away     [] power [] provide LE support  [] accommodate to hamstring tightness [] elevate legs during recline   [] provide change in position for Legs [] Maintain placement of feet on footplate [] durability [] enable transfers [] decrease edema [] Accommodate lower leg length []  ?????  Foot support Footplate    [] Lt  []  Rt  [x]  Center mount [x] flip up     [x] depth/angle adjustable [] Amputee adapter    []  Lt     []  Rt [x] provide foot support [x] accommodate to ankle ROM [x] transfers [x] Provide support for residual extremity [x]  allow foot to go under wheelchair base []  decrease tone  [x]  padded foot boxes due to ankle/foot wounds and positioning  []  Ankle strap/heel loops [] support foot on foot support [] decrease extraneous movement [] provide input to heel  [] protect foot  Tires: [x] pneumatic  [x] flat free inserts  [] solid  [x] decrease maintenance  [x] prevent frequent flats [] increase shock absorbency [] decrease pain from road  shock [] decrease spasms from road shock []  ?????  [x]  Headrest  [x] provide posterior head support [x] provide posterior neck support [x] provide lateral head support [] provide anterior head support [x] support during tilt and recline [] improve feeding   [x] improve respiration [] placement of switches [x] safety  [x] accommodate ROM  [] accommodate tone [x] improve visual orientation  []  Anterior chest strap []  Vest []  Shoulder retractors  [] decrease forward movement of shoulder [] accommodation of TLSO [] decrease forward movement of trunk [] decrease shoulder elevation [] added abdominal support [] alignment [] assistance with shoulder control  []  ?????  Pelvic Positioner [x] Belt [] SubASIS bar [] Dual Pull [] stabilize tone [x] decrease falling out of chair/ **will not Decr potential for sliding due to pelvic tilting [] prevent excessive rotation [] pad for protection over boney prominence [] prominence comfort [] special pull angle to control rotation []  ?????  Upper Extremity Support [] L   []  R [] Arm trough    [] hand support []  tray       [] full tray [] swivel mount [] decrease edema      [] decrease subluxation   [] control tone   [] placement for AAC/Computer/EADL [] decrease gravitational pull on shoulders [] provide midline positioning [] provide support to increase UE function [] provide hand support in natural position [] provide work surface   POWER WHEELCHAIR CONTROLS  [x] Proportional  [] Non-Proportional Type Joystick [] Left  [x] Right [x] provides access for controlling wheelchair   [] lacks motor control to operate proportional drive control [] unable to understand proportional controls  Actuator Control Module  [] Single  [x] Multiple   [x] Allow the  client to operate the power seat function(s) through the joystick control   [] Safety Reset Switches [] Used to change modes and stop the wheelchair when driving in latch mode    [x] Upgraded Electronics   [x] programming for accurate  control [x] progressive Disease/changing condition [] non-proportional drive control needed [x] Needed in order to operate power seat functions through joystick control   [] Display box [] Allows user to see in which mode and drive the wheelchair is set  [] necessary for alternate controls    [] Digital interface electronics [] Allows w/c to operate when using alternative drive controls  [] ASL Head Array [] Allows client to operate wheelchair  through switches placed in tri-panel headrest  [] Sip and puff with tubing kit [] needed to operate sip and puff drive controls  [] Upgraded tracking electronics [] increase safety when driving [] correct tracking when on uneven surfaces  [x] Mount for switches or joystick [x] Attaches switches to w/c  [x] Swing away for access or transfers [] midline for optimal placement [] provides for consistent access  [x] Attendant controlled joystick plus mount [x] safety [x] long distance driving [x] operation of seat functions [] compliance with transportation regulations []  ?????    Rear wheel placement/Axle adjustability [] None [] semi adjustable [] fully adjustable  [] improved UE access to wheels [] improved stability [] changing angle in space for improvement of postural stability [] 1-arm drive access [] amputee pad placement []  ?????  Wheel rims/ hand rims  [] metal  [] plastic coated [] oblique projections [] vertical projections [] Provide ability to propel manual wheelchair  []  Increase self-propulsion with hand weakness/decreased grasp  Push handles [] extended  [] angle adjustable  [] standard [] caregiver access [] caregiver assist [] allows "hooking" to enable increased ability to perform ADLs or maintain balance  One armed device  [] Lt   [] Rt [] enable propulsion of manual wheelchair with one arm   []  ?????   Brake/wheel lock extension []  Lt   []  Rt [] increase indep in applying wheel locks   [] Side guards [] prevent clothing getting caught in wheel or becoming soiled []  prevent  skin tears/abrasions  Battery: NF22 X 2 [x] to power wheelchair ?????  Other: ????? ????? ?????  The above equipment has a life- long use expectancy. Growth and changes in medical and/or functional conditions would be the exceptions. This is to certify that the therapist has no financial relationship with durable medical provider or manufacturer. The therapist will not receive remuneration of any kind for the equipment recommended in this evaluation.   Patient has mobility limitation that significantly impairs safe, timely participation in one or more mobility related ADL's.  (bathing, toileting, feeding, dressing, grooming, moving from room to room)                                                             [x]  Yes []  No Will mobility device sufficiently improve ability to participate and/or be aided in participation of MRADL's?         [x]  Yes []  No Can limitation be compensated for with use of a cane or walker?                                                                                []   Yes [x]  No Does patient or caregiver demonstrate ability/potential ability & willingness to safely use the mobility device?   [x]  Yes []  No Does patient's home environment support use of recommended mobility device?                                                    [x]  Yes []  No Does patient have sufficient upper extremity function necessary to functionally propel a manual wheelchair?    []  Yes [x]  No Does patient have sufficient strength and trunk stability to safely operate a POV (scooter)?                                  []  Yes [x]  No Does patient need additional features/benefits provided by a power wheelchair for MRADL's in the home?       [x]  Yes []  No Does the patient demonstrate the ability to safely use a power wheelchair?                                                              [x]  Yes []  No  Therapist Name Printed: Jamey Reas, PT, DPT Date: 2015/03/24  Therapist's Signature:   Date:    Supplier's Name Printed: [employer], [employer] Date: 2015/03/24  Supplier's Signature:   Date:  Patient/Caregiver Signature:   Date:     This is to certify that I have read this evaluation and do agree with the content within:    Physician's Name Printed: [name], MD  Physician's Signature:  Date:     This is to certify that I, the above signed therapist have the following affiliations: []  This DME provider []  Manufacturer of recommended equipment []  Patient's long term care facility [x]  None of the above                                     G-Codes - 03-24-2015 1441    Functional Assessment Tool Used dependent in all transfers. bed bound without w/c. unable to propel manual w/c.    Functional Limitation Changing and maintaining body position   Changing and Maintaining Body Position Current Status [phone]) 100 percent impaired, limited or restricted   Changing and Maintaining Body Position Goal Status (H6314) 100 percent impaired, limited or restricted   Changing and Maintaining Body Position Discharge Status (H7026) 100 percent impaired, limited or restricted       Problem List Patient Active Problem List   Diagnosis Date Noted  . Osteomyelitis ([place]) 11/06/2014  . Sacral decubitus ulcer   . Paraplegia ([place]) 11/01/2014  . Sacral ulcer 10/31/2014  . Hypothyroidism 10/31/2014  . Breast cancer of lower-inner quadrant of right female breast ([place]) 12/22/2012  . HTN (hypertension) 04/15/2012  . Left knee DJD 04/13/2012    Class: Chronic    Nathania Waldman PT, DPT Mar 24, 2015, 2:43 PM  [facility] [address] [place], [place], [address] Phone: [phone]   Fax:  [phone]  Name: Shreya Lacasse MRN:  [id number] Date of Birth: 10-26-1950

## 2015-03-07 DIAGNOSIS — L89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, stage 4: Secondary | ICD-10-CM | POA: Diagnosis not present

## 2015-03-28 ENCOUNTER — Encounter (HOSPITAL_BASED_OUTPATIENT_CLINIC_OR_DEPARTMENT_OTHER): Payer: Medicare Other

## 2015-04-04 ENCOUNTER — Encounter (HOSPITAL_BASED_OUTPATIENT_CLINIC_OR_DEPARTMENT_OTHER): Payer: Medicare Other | Attending: General Surgery

## 2015-04-04 DIAGNOSIS — Z029 Encounter for administrative examinations, unspecified: Secondary | ICD-10-CM | POA: Diagnosis not present

## 2015-04-11 ENCOUNTER — Other Ambulatory Visit: Payer: Self-pay | Admitting: *Deleted

## 2015-04-11 DIAGNOSIS — C50311 Malignant neoplasm of lower-inner quadrant of right female breast: Secondary | ICD-10-CM

## 2015-04-12 ENCOUNTER — Ambulatory Visit: Payer: Self-pay | Admitting: Oncology

## 2015-04-12 ENCOUNTER — Other Ambulatory Visit: Payer: Medicare Other

## 2015-04-12 ENCOUNTER — Telehealth: Payer: Self-pay | Admitting: Oncology

## 2015-04-12 NOTE — Telephone Encounter (Signed)
Returned patient call re not being able to keep appointment today and wanting to r/s. Left message for patient re new lab/GM for 2/8. Schedule mailed.

## 2015-05-01 ENCOUNTER — Encounter (HOSPITAL_BASED_OUTPATIENT_CLINIC_OR_DEPARTMENT_OTHER): Payer: Medicare Other

## 2015-05-09 ENCOUNTER — Encounter (HOSPITAL_BASED_OUTPATIENT_CLINIC_OR_DEPARTMENT_OTHER): Payer: Medicare Other | Attending: Surgery

## 2015-05-09 DIAGNOSIS — G894 Chronic pain syndrome: Secondary | ICD-10-CM | POA: Insufficient documentation

## 2015-05-09 DIAGNOSIS — E039 Hypothyroidism, unspecified: Secondary | ICD-10-CM | POA: Diagnosis not present

## 2015-05-09 DIAGNOSIS — X58XXXA Exposure to other specified factors, initial encounter: Secondary | ICD-10-CM | POA: Diagnosis not present

## 2015-05-09 DIAGNOSIS — L89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, stage 4: Secondary | ICD-10-CM | POA: Insufficient documentation

## 2015-05-09 DIAGNOSIS — F0390 Unspecified dementia without behavioral disturbance: Secondary | ICD-10-CM | POA: Insufficient documentation

## 2015-05-09 DIAGNOSIS — Z853 Personal history of malignant neoplasm of breast: Secondary | ICD-10-CM | POA: Diagnosis not present

## 2015-05-09 DIAGNOSIS — M1990 Unspecified osteoarthritis, unspecified site: Secondary | ICD-10-CM | POA: Diagnosis not present

## 2015-05-09 DIAGNOSIS — Z923 Personal history of irradiation: Secondary | ICD-10-CM | POA: Diagnosis not present

## 2015-05-09 DIAGNOSIS — L89622 Pressure ulcer of left heel, stage 2: Secondary | ICD-10-CM | POA: Diagnosis not present

## 2015-05-09 DIAGNOSIS — I10 Essential (primary) hypertension: Secondary | ICD-10-CM | POA: Insufficient documentation

## 2015-05-09 DIAGNOSIS — L89152 Pressure ulcer of sacral region, stage 2: Secondary | ICD-10-CM | POA: Insufficient documentation

## 2015-05-09 DIAGNOSIS — G8220 Paraplegia, unspecified: Secondary | ICD-10-CM | POA: Insufficient documentation

## 2015-05-09 DIAGNOSIS — S31819A Unspecified open wound of right buttock, initial encounter: Secondary | ICD-10-CM | POA: Diagnosis not present

## 2015-05-16 ENCOUNTER — Other Ambulatory Visit (HOSPITAL_BASED_OUTPATIENT_CLINIC_OR_DEPARTMENT_OTHER): Payer: Medicare Other

## 2015-05-16 ENCOUNTER — Ambulatory Visit (HOSPITAL_BASED_OUTPATIENT_CLINIC_OR_DEPARTMENT_OTHER): Payer: Medicare Other | Admitting: Oncology

## 2015-05-16 VITALS — BP 138/66 | HR 84 | Temp 97.4°F | Resp 18 | Ht 63.0 in

## 2015-05-16 DIAGNOSIS — C50311 Malignant neoplasm of lower-inner quadrant of right female breast: Secondary | ICD-10-CM | POA: Diagnosis not present

## 2015-05-16 DIAGNOSIS — Z79811 Long term (current) use of aromatase inhibitors: Secondary | ICD-10-CM

## 2015-05-16 DIAGNOSIS — Z170 Estrogen receptor positive status [ER+]: Secondary | ICD-10-CM

## 2015-05-16 DIAGNOSIS — G8220 Paraplegia, unspecified: Secondary | ICD-10-CM

## 2015-05-16 DIAGNOSIS — M869 Osteomyelitis, unspecified: Secondary | ICD-10-CM

## 2015-05-16 LAB — COMPREHENSIVE METABOLIC PANEL
ALT: 17 U/L (ref 0–55)
AST: 19 U/L (ref 5–34)
Albumin: 3.6 g/dL (ref 3.5–5.0)
Alkaline Phosphatase: 88 U/L (ref 40–150)
Anion Gap: 10 mEq/L (ref 3–11)
BUN: 22 mg/dL (ref 7.0–26.0)
CO2: 30 meq/L — AB (ref 22–29)
CREATININE: 0.9 mg/dL (ref 0.6–1.1)
Calcium: 9.9 mg/dL (ref 8.4–10.4)
Chloride: 102 mEq/L (ref 98–109)
EGFR: 65 mL/min/{1.73_m2} — ABNORMAL LOW (ref >90)
Glucose: 121 mg/dl (ref 70–140)
POTASSIUM: 4 meq/L (ref 3.5–5.1)
SODIUM: 142 meq/L (ref 136–145)
Total Bilirubin: 0.39 mg/dL (ref 0.20–1.20)
Total Protein: 9.1 g/dL — ABNORMAL HIGH (ref 6.4–8.3)

## 2015-05-16 LAB — CBC WITH DIFFERENTIAL/PLATELET
BASO%: 0.4 % (ref 0.0–2.0)
BASOS ABS: 0 10*3/uL (ref 0.0–0.1)
EOS%: 1.6 % (ref 0.0–7.0)
Eosinophils Absolute: 0.1 10*3/uL (ref 0.0–0.5)
HCT: 35.3 % (ref 34.8–46.6)
HGB: 11.7 g/dL (ref 11.6–15.9)
LYMPH%: 22.8 % (ref 14.0–49.7)
MCH: 30.9 pg (ref 25.1–34.0)
MCHC: 33 g/dL (ref 31.5–36.0)
MCV: 93.5 fL (ref 79.5–101.0)
MONO#: 0.4 10*3/uL (ref 0.1–0.9)
MONO%: 8 % (ref 0.0–14.0)
NEUT#: 3.3 10*3/uL (ref 1.5–6.5)
NEUT%: 67.2 % (ref 38.4–76.8)
Platelets: 328 10*3/uL (ref 145–400)
RBC: 3.78 10*6/uL (ref 3.70–5.45)
RDW: 16.1 % — AB (ref 11.2–14.5)
WBC: 4.9 10*3/uL (ref 3.9–10.3)
lymph#: 1.1 10*3/uL (ref 0.9–3.3)

## 2015-05-16 NOTE — Progress Notes (Signed)
Patient [name], female   DOB: [birth year], 65 y.o.   MRN: [id number] [place]  [phone]   [phone]  OFFICE PROGRESS NOTE  [name]   DOB: October 16, 1950  MR#: [id number]  PPI#:[id number]   PCP: [name], MD SU: Fanny Skates, MD RAD ONC:  Jodelle Gross, MD OTHER MD:  Braulio Bosch   HISTORY OF PRESENT ILLNESS: From Dr. Julien Girt new patient evaluation dated 02/06/2010:    "This is a delightful 65 year old woman from [facility] referred by Dr. Dalbert Batman for evaluation and treatment of breast cancer.  She is here today with her family.  This woman has been in good health.  She has undergone annual screening mammography.  She has also been in reasonably good health.  She had a mammogram performed on 11/25/2009 as well as 12/03/2009.  These showed potential abnormality in the right breast.  Focal density was noted medially.  No other abnormalities were seen.  Right breast ultrasound showed an irregular hypoechoic mass measuring 7 x 7.5 x 10 located at 4 o'clock 3 cm from the nipple.  A biopsy was recommended.  Biopsy performed on 12/19/2009 showed invasive ductal cancer associated with DCIS.  This is ER positive 86%, PR positive 41%, proliferative index is 7%, HER-2 is not amplified with the ratio of 1.17.  A preoperative MRI scan was performed on 12/25/2009; this showed a small enhancing mass in the lower inner quadrant of right breast.  This measured 9 x 7 x 11 mm.  Bilateral axillary lymph nodes were visualized, were not pathologically enlarged.  The patient underwent a lumpectomy and sentinel lymph node evaluation on 01/22/2010.  This showed an invasive ductal cancer, grade 1 of 3, measuring 1.1 cm.  Surgical margins were negative.  DCIS was seen focally 0.1 cm from lateral margin.  Two sentinel lymph nodes were identified, both of which were negative for malignancy.  The patient has had an unremarkable postoperative course."  Her subsequent  history is as detailed below.  INTERVAL HISTORY: [name] returns today for follow up of her estrogen receptor positive breast cancer, accompanied by her husband, Harrington Challenger. She continues on anastrozole. She tolerates this well. Hot flashes and night sweats are not a major issue. Vaginal wetness is not a concern. She obtains it at a little more than $2 per month.  REVIEW OF SYSTEMS: [name] is now wheelchair-bound. She had a decubitus ulcer which according to her account was not appropriately tended by home nursing and became larger and invaded the bone. She is now being treated through the wound clinic at [facility] and the lesion is getting much better. Aside from that she has some problems with incontinence, and, disease. She is a little bit behind on her mammography. Otherwise a detailed review of systems today was noncontributory  PAST MEDICAL HISTORY: Past Medical History  Diagnosis Date  . Hypertension   . Difficult intubation     Fastrach #4 LMA then # 7 ETT used in 2006 [facility], cervical laminectomy)  . Hypothyroidism   . Cervical neuropathy     PERIPHERAL NEUROPATHY , HAD CERVICAL FUSION  . PONV (postoperative nausea and vomiting)   . Hypercholesteremia   . Arthritis     "all over" (04/13/2012)  . Breast cancer [facility]) 2011    "right" (04/13/2012)  . Sacral decubitus ulcer 10/2014  . Kidney stones 1990's    "twice; passed in hospital on my own" (04/13/2012)    PAST SURGICAL HISTORY: Past Surgical History  Procedure  Laterality Date  . [place] luc  ~ 2003    "benign tumor removed from up under gum" (04/13/2012)  . Posterior cervical laminectomy  2006  . Eye surgery  2004    STENTS TO BIL EYES  . Tonsillectomy and adenoidectomy  ~ 1954  . Abdominal hysterectomy  ~ 1976  . Breast lumpectomy  2011    "right" (04/13/2012)  . Dacrocystorhinostomy  ~ 2000    "put stents in my tear ducts; both eyes" (04/13/2012)  . Total knee arthroplasty with revision components  04/13/2012    Procedure:  TOTAL KNEE ARTHROPLASTY WITH REVISION COMPONENTS;  Surgeon: Hessie Dibble, MD;  Location: [facility];  Service: Orthopedics;  Laterality: Left;  . Incision and drainage of wound Left 11/03/2014    Procedure: IRRIGATION AND DEBRIDEMENT SACRAL ULCER;  Surgeon: Irene Limbo, MD;  Location: [facility];  Service: Plastics;  Laterality: Left;  . I&d extremity Bilateral 11/03/2014    Procedure: IRRIGATION AND DEBRIDEMENT BILATERAL HEEL;  Surgeon: Irene Limbo, MD;  Location: [facility];  Service: Plastics;  Laterality: Bilateral;    FAMILY HISTORY Family History  Problem Relation Age of Onset  . Heart disease Father     GYNECOLOGIC HISTORY:   She is G 2, P 2, menarche age 23, had menopause at the time of hysterectomy (ovaries retained).  SOCIAL HISTORY: The patient worked as Secretary/administrator for many years, both in [place] as well as [employer].  She previously worked at the [employer], discontinued working over 10 years ago and has taught at [employer].  She became disabled after her next surgery in 2006. The patient's husband, Harrington Challenger, used to own a [employer] and later owned a Armed forces operational officer. He is now retired. Is just the 2 of them at home. She has two adult children, [name] who lives in the [place] and [name] lives in [place]. Both work with disabled children..  She is on disability.   ADVANCED DIRECTIVES: Not on file.   HEALTH MAINTENANCE: Social History  Substance Use Topics  . Smoking status: Light Tobacco Smoker -- 0.25 packs/day for 48 years    Types: Cigarettes  . Smokeless tobacco: Never Used  . Alcohol Use: No     Colonoscopy: The patient has never had a colonoscopy.   PAP:  The patient states her last Pap smear was 2 years ago.  Bone density: March 2014/ [facility]/ normal  Lipid panel:  Not on file.  Allergies  Allergen Reactions  . Ivp Dye [Iodinated Diagnostic Agents] Other (See Comments)    "hot and sweaty and almost passed out"  . Aspirin Hives  . Sulfa Antibiotics Hives     Current Outpatient Prescriptions  Medication Sig Dispense Refill  . acetaminophen (TYLENOL) 500 MG tablet Take 500 mg by mouth every 6 (six) hours as needed for mild pain or moderate pain.    . Alpha-D-Galactosidase (BEANO MELTAWAYS PO) Take 1 tablet by mouth daily as needed (stomach pain).    Marland Kitchen anastrozole (ARIMIDEX) 1 MG tablet TAKE 1 TABLET BY MOUTH DAILY 30 tablet 8  . ascorbic acid (VITAMIN C) 500 MG tablet Take 1 tablet (500 mg total) by mouth daily. 30 tablet 12  . baclofen (LIORESAL) 10 MG tablet Take 30 mg by mouth 2 (two) times daily.    . cefTAZidime 2 g in dextrose 5 % 50 mL Inject 2 g into the vein every 8 (eight) hours. 1050 mL 5  . DULoxetine (CYMBALTA) 60 MG capsule Take 60 mg by mouth daily after  supper.     . folic acid (FOLVITE) 1 MG tablet Take 1 mg by mouth daily after supper.     . furosemide (LASIX) 40 MG tablet Take 40 mg by mouth daily after supper.     . gabapentin (NEURONTIN) 800 MG tablet Take 1,600 mg by mouth 2 (two) times daily.    Marland Kitchen levothyroxine (SYNTHROID, LEVOTHROID) 125 MCG tablet Take 1 tablet (125 mcg total) by mouth daily before breakfast. 30 tablet 0  . methocarbamol (ROBAXIN) 500 MG tablet Take 1 tablet (500 mg total) by mouth every 6 (six) hours as needed. 60 tablet 0  . metoprolol (LOPRESSOR) 100 MG tablet Take 100 mg by mouth daily after supper.    . morphine (MS CONTIN) 60 MG 12 hr tablet Take 60 mg by mouth every 12 (twelve) hours.  0  . Multiple Vitamin (MULTIVITAMIN WITH MINERALS) TABS tablet Take 1 tablet by mouth daily. 30 tablet 12  . NIFEdipine (PROCARDIA XL/ADALAT-CC) 30 MG 24 hr tablet Take 30 mg by mouth daily.      Marland Kitchen oxyCODONE-acetaminophen (PERCOCET) 10-325 MG per tablet Take 1 tablet by mouth 6 (six) times daily.  0  . pantoprazole (PROTONIX) 40 MG tablet Take 1 tablet (40 mg total) by mouth daily. 30 tablet 0  . Simethicone (GAS-X EXTRA STRENGTH) 125 MG CAPS Take 125 mg by mouth daily as needed (stomach pain).    . zinc sulfate 220  MG capsule Take 1 capsule (220 mg total) by mouth daily. 30 capsule 12   No current facility-administered medications for this visit.    OBJECTIVE: Middle-aged [place] American woman examined in a wheelchair Filed Vitals:   05/16/15 1402  BP: 138/66  Pulse: 84  Temp: 97.4 F (36.3 C)  Resp: 18     There is no weight on file to calculate BMI.      ECOG FS: 2  Sclerae unicteric, pupils round and equal Oropharynx clear, no thrush or other lesions No cervical or supraclavicular adenopathy Lungs no rales or rhonchi Heart regular rate and rhythm Abd soft, nontender, positive bowel sounds MSK no focal spinal tenderness, no upper extremity lymphedema Neuro: nonfocal, well oriented, appropriate affect Breasts: The right breast is status post lumpectomy and radiation. There is mild distortion of the breast profile, but no evidence of local recurrence. The right axilla is benign per the left breast is unremarkable.    LAB RESULTS: Lab Results  Component Value Date   WBC 4.9 05/16/2015   NEUTROABS 3.3 05/16/2015   HGB 11.7 05/16/2015   HCT 35.3 05/16/2015   MCV 93.5 05/16/2015   PLT 328 05/16/2015      Chemistry      Component Value Date/Time   NA 140 11/07/2014 0306   NA 142 01/11/2014 1256   K 4.2 11/07/2014 0306   K 3.8 01/11/2014 1256   CL 109 11/07/2014 0306   CL 103 04/02/2012 1150   CO2 25 11/07/2014 0306   CO2 29 01/11/2014 1256   BUN 10 11/07/2014 0306   BUN 12.7 01/11/2014 1256   CREATININE 0.86 11/07/2014 0306   CREATININE 0.9 01/11/2014 1256      Component Value Date/Time   CALCIUM 8.5* 11/07/2014 0306   CALCIUM 9.7 01/11/2014 1256   ALKPHOS 49 11/05/2014 0455   ALKPHOS 93 01/11/2014 1256   AST 34 11/05/2014 0455   AST 23 01/11/2014 1256   ALT 15 11/05/2014 0455   ALT 20 01/11/2014 1256   BILITOT 0.6 11/05/2014 0455   BILITOT  0.37 01/11/2014 1256      Lab Results  Component Value Date   LABCA2 18 02/17/2011   Urinalysis    Component Value  Date/Time   COLORURINE YELLOW 11/01/2014 1127   APPEARANCEUR CLOUDY* 11/01/2014 1127   LABSPEC 1.016 11/01/2014 1127   PHURINE 8.5* 11/01/2014 1127   GLUCOSEU NEGATIVE 11/01/2014 1127   HGBUR NEGATIVE 11/01/2014 1127   BILIRUBINUR NEGATIVE 11/01/2014 1127   KETONESUR NEGATIVE 11/01/2014 1127   PROTEINUR NEGATIVE 11/01/2014 1127   UROBILINOGEN 1.0 11/01/2014 1127   NITRITE NEGATIVE 11/01/2014 1127   LEUKOCYTESUR MODERATE* 11/01/2014 1127    STUDIES: No results found.   ASSESSMENT: 65 y.o.  [place] woman:  1. Status post right breast lower inner quadrant lumpectomy with sentinel biopsy on 01/22/2010 for a pT1c pN0,stage IA  invasive ductal carcinoma, grade 1, ER 86%, PR 41%, Ki-67 7%, HER-2 new no amplification with a 1.17 ratio.  2. Status post radiation therapy from 03/18/2010 through 05/07/2010.  3. Oncotype DX score of 15% predicts an average rate of distant recurrence of 10% within 10 years if the patient's only systemic treatment is tamoxifen for 5 years.  4. on Anastrozole between February 2012 and February 2017..  (a) bone density scan on 07/01/12 showed a t-score of 0.2 (normal).   PLAN: [name] is now more than 5 years out from her definitive surgery for breast cancer, with no evidence of disease recurrence. This is very favorable.  She has completed 5 years of antiestrogen therapy. She tolerated this well. She understands by the taking these drugs she has cut in half her risk of breast cancer: Both the risk of local recurrence, the risk of systemic recurrence and also the risk of developing a new breast cancer in either breast.  At this point I am, for over releasing her to her primary care physician. All she needs in terms of breast cancer follow-up is yearly mammography preferably with tomography and a yearly physician breast exam. She is actually a little bit behind on her mammography so I went ahead and put the order in. This should be done sometime this month  I  will be glad to see [name] any point in the future if I when the need arises, but as of now we are making no further routine appointment for her here Chauncey Cruel, MD   05/16/2015, 2:11 PM

## 2015-05-17 ENCOUNTER — Telehealth: Payer: Self-pay | Admitting: Oncology

## 2015-05-17 NOTE — Telephone Encounter (Signed)
Spoke with patient to confirm appointment date/time for [facility] Feb 16 @ 1015 am for her mammogram

## 2015-06-06 ENCOUNTER — Encounter (HOSPITAL_BASED_OUTPATIENT_CLINIC_OR_DEPARTMENT_OTHER): Payer: Medicare Other | Attending: Surgery

## 2015-06-06 DIAGNOSIS — Z87891 Personal history of nicotine dependence: Secondary | ICD-10-CM | POA: Diagnosis not present

## 2015-06-06 DIAGNOSIS — M1990 Unspecified osteoarthritis, unspecified site: Secondary | ICD-10-CM | POA: Diagnosis not present

## 2015-06-06 DIAGNOSIS — E039 Hypothyroidism, unspecified: Secondary | ICD-10-CM | POA: Diagnosis not present

## 2015-06-06 DIAGNOSIS — F0390 Unspecified dementia without behavioral disturbance: Secondary | ICD-10-CM | POA: Insufficient documentation

## 2015-06-06 DIAGNOSIS — Z923 Personal history of irradiation: Secondary | ICD-10-CM | POA: Insufficient documentation

## 2015-06-06 DIAGNOSIS — I10 Essential (primary) hypertension: Secondary | ICD-10-CM | POA: Insufficient documentation

## 2015-06-06 DIAGNOSIS — L89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, stage 4: Secondary | ICD-10-CM | POA: Diagnosis not present

## 2015-06-06 DIAGNOSIS — G894 Chronic pain syndrome: Secondary | ICD-10-CM | POA: Diagnosis not present

## 2015-06-06 DIAGNOSIS — Z853 Personal history of malignant neoplasm of breast: Secondary | ICD-10-CM | POA: Diagnosis not present

## 2015-06-06 DIAGNOSIS — L97421 Non-pressure chronic ulcer of left heel and midfoot limited to breakdown of skin: Secondary | ICD-10-CM | POA: Diagnosis not present

## 2015-06-06 DIAGNOSIS — G8221 Paraplegia, complete: Secondary | ICD-10-CM | POA: Diagnosis not present

## 2015-06-27 DIAGNOSIS — L89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, stage 4: Secondary | ICD-10-CM | POA: Diagnosis not present

## 2015-07-18 ENCOUNTER — Encounter (HOSPITAL_BASED_OUTPATIENT_CLINIC_OR_DEPARTMENT_OTHER): Payer: Medicare Other | Attending: Surgery

## 2015-07-18 DIAGNOSIS — L89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, stage 4: Secondary | ICD-10-CM | POA: Insufficient documentation

## 2015-07-18 DIAGNOSIS — M1990 Unspecified osteoarthritis, unspecified site: Secondary | ICD-10-CM | POA: Insufficient documentation

## 2015-07-18 DIAGNOSIS — Z923 Personal history of irradiation: Secondary | ICD-10-CM | POA: Insufficient documentation

## 2015-07-18 DIAGNOSIS — F0390 Unspecified dementia without behavioral disturbance: Secondary | ICD-10-CM | POA: Insufficient documentation

## 2015-07-18 DIAGNOSIS — I10 Essential (primary) hypertension: Secondary | ICD-10-CM | POA: Insufficient documentation

## 2015-07-18 DIAGNOSIS — G8220 Paraplegia, unspecified: Secondary | ICD-10-CM | POA: Insufficient documentation

## 2015-07-25 DIAGNOSIS — G8220 Paraplegia, unspecified: Secondary | ICD-10-CM | POA: Diagnosis not present

## 2015-07-25 DIAGNOSIS — I10 Essential (primary) hypertension: Secondary | ICD-10-CM | POA: Diagnosis not present

## 2015-07-25 DIAGNOSIS — F0390 Unspecified dementia without behavioral disturbance: Secondary | ICD-10-CM | POA: Diagnosis not present

## 2015-07-25 DIAGNOSIS — M1990 Unspecified osteoarthritis, unspecified site: Secondary | ICD-10-CM | POA: Diagnosis not present

## 2015-07-25 DIAGNOSIS — Z923 Personal history of irradiation: Secondary | ICD-10-CM | POA: Diagnosis not present

## 2015-07-25 DIAGNOSIS — L89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, stage 4: Secondary | ICD-10-CM | POA: Diagnosis present

## 2015-08-10 ENCOUNTER — Inpatient Hospital Stay (HOSPITAL_COMMUNITY)
Admission: EM | Admit: 2015-08-10 | Discharge: 2015-08-13 | DRG: 871 | Disposition: A | Discharge status: Home Health | Payer: Medicare Other | Attending: Internal Medicine | Admitting: Internal Medicine

## 2015-08-10 ENCOUNTER — Emergency Department (HOSPITAL_COMMUNITY): Payer: Medicare Other

## 2015-08-10 ENCOUNTER — Encounter (HOSPITAL_COMMUNITY): Payer: Self-pay | Admitting: Emergency Medicine

## 2015-08-10 DIAGNOSIS — R410 Disorientation, unspecified: Secondary | ICD-10-CM | POA: Diagnosis present

## 2015-08-10 DIAGNOSIS — N202 Calculus of kidney with calculus of ureter: Secondary | ICD-10-CM | POA: Diagnosis present

## 2015-08-10 DIAGNOSIS — D72829 Elevated white blood cell count, unspecified: Secondary | ICD-10-CM

## 2015-08-10 DIAGNOSIS — G9340 Encephalopathy, unspecified: Secondary | ICD-10-CM | POA: Diagnosis not present

## 2015-08-10 DIAGNOSIS — F329 Major depressive disorder, single episode, unspecified: Secondary | ICD-10-CM | POA: Diagnosis present

## 2015-08-10 DIAGNOSIS — I959 Hypotension, unspecified: Secondary | ICD-10-CM | POA: Diagnosis present

## 2015-08-10 DIAGNOSIS — N178 Other acute kidney failure: Secondary | ICD-10-CM | POA: Diagnosis present

## 2015-08-10 DIAGNOSIS — R4182 Altered mental status, unspecified: Secondary | ICD-10-CM | POA: Diagnosis not present

## 2015-08-10 DIAGNOSIS — L89159 Pressure ulcer of sacral region, unspecified stage: Secondary | ICD-10-CM | POA: Diagnosis present

## 2015-08-10 DIAGNOSIS — A419 Sepsis, unspecified organism: Principal | ICD-10-CM | POA: Diagnosis present

## 2015-08-10 DIAGNOSIS — L98429 Non-pressure chronic ulcer of back with unspecified severity: Secondary | ICD-10-CM | POA: Diagnosis present

## 2015-08-10 DIAGNOSIS — N390 Urinary tract infection, site not specified: Secondary | ICD-10-CM | POA: Diagnosis present

## 2015-08-10 DIAGNOSIS — Z853 Personal history of malignant neoplasm of breast: Secondary | ICD-10-CM

## 2015-08-10 DIAGNOSIS — G8221 Paraplegia, complete: Secondary | ICD-10-CM | POA: Diagnosis present

## 2015-08-10 DIAGNOSIS — I10 Essential (primary) hypertension: Secondary | ICD-10-CM | POA: Diagnosis not present

## 2015-08-10 DIAGNOSIS — Z96652 Presence of left artificial knee joint: Secondary | ICD-10-CM | POA: Diagnosis present

## 2015-08-10 DIAGNOSIS — Z981 Arthrodesis status: Secondary | ICD-10-CM

## 2015-08-10 DIAGNOSIS — I119 Hypertensive heart disease without heart failure: Secondary | ICD-10-CM | POA: Diagnosis present

## 2015-08-10 DIAGNOSIS — E785 Hyperlipidemia, unspecified: Secondary | ICD-10-CM | POA: Diagnosis present

## 2015-08-10 DIAGNOSIS — I9589 Other hypotension: Secondary | ICD-10-CM | POA: Diagnosis present

## 2015-08-10 DIAGNOSIS — N179 Acute kidney failure, unspecified: Secondary | ICD-10-CM | POA: Diagnosis not present

## 2015-08-10 DIAGNOSIS — Z87442 Personal history of urinary calculi: Secondary | ICD-10-CM

## 2015-08-10 DIAGNOSIS — K5903 Drug induced constipation: Secondary | ICD-10-CM | POA: Diagnosis present

## 2015-08-10 DIAGNOSIS — F17210 Nicotine dependence, cigarettes, uncomplicated: Secondary | ICD-10-CM | POA: Diagnosis present

## 2015-08-10 DIAGNOSIS — G9341 Metabolic encephalopathy: Secondary | ICD-10-CM | POA: Diagnosis present

## 2015-08-10 DIAGNOSIS — G8220 Paraplegia, unspecified: Secondary | ICD-10-CM | POA: Diagnosis present

## 2015-08-10 DIAGNOSIS — T402X5A Adverse effect of other opioids, initial encounter: Secondary | ICD-10-CM | POA: Diagnosis present

## 2015-08-10 DIAGNOSIS — N200 Calculus of kidney: Secondary | ICD-10-CM | POA: Insufficient documentation

## 2015-08-10 DIAGNOSIS — B9620 Unspecified Escherichia coli [E. coli] as the cause of diseases classified elsewhere: Secondary | ICD-10-CM | POA: Diagnosis present

## 2015-08-10 DIAGNOSIS — Z23 Encounter for immunization: Secondary | ICD-10-CM

## 2015-08-10 DIAGNOSIS — E039 Hypothyroidism, unspecified: Secondary | ICD-10-CM | POA: Diagnosis present

## 2015-08-10 DIAGNOSIS — L89154 Pressure ulcer of sacral region, stage 4: Secondary | ICD-10-CM | POA: Diagnosis present

## 2015-08-10 DIAGNOSIS — E038 Other specified hypothyroidism: Secondary | ICD-10-CM | POA: Diagnosis present

## 2015-08-10 DIAGNOSIS — C50311 Malignant neoplasm of lower-inner quadrant of right female breast: Secondary | ICD-10-CM | POA: Diagnosis present

## 2015-08-10 DIAGNOSIS — E869 Volume depletion, unspecified: Secondary | ICD-10-CM | POA: Diagnosis present

## 2015-08-10 HISTORY — DX: Malignant neoplasm of unspecified site of right female breast: C50.911

## 2015-08-10 LAB — CBC WITH DIFFERENTIAL/PLATELET
BASOS ABS: 0 10*3/uL (ref 0.0–0.1)
BASOS PCT: 0 %
Basophils Absolute: 0 10*3/uL (ref 0.0–0.1)
Basophils Relative: 0 %
EOS ABS: 0.1 10*3/uL (ref 0.0–0.7)
Eosinophils Absolute: 0 10*3/uL (ref 0.0–0.7)
Eosinophils Relative: 0 %
Eosinophils Relative: 0 %
HEMATOCRIT: 29.3 % — AB (ref 36.0–46.0)
HEMATOCRIT: 28.3 % — AB (ref 36.0–46.0)
HEMOGLOBIN: 9.2 g/dL — AB (ref 12.0–15.0)
HEMOGLOBIN: 8.7 g/dL — AB (ref 12.0–15.0)
LYMPHS ABS: 1.3 10*3/uL (ref 0.7–4.0)
LYMPHS PCT: 9 %
Lymphocytes Relative: 8 %
Lymphs Abs: 1 10*3/uL (ref 0.7–4.0)
MCH: 29.5 pg (ref 26.0–34.0)
MCH: 29.4 pg (ref 26.0–34.0)
MCHC: 31.4 g/dL (ref 30.0–36.0)
MCHC: 30.7 g/dL (ref 30.0–36.0)
MCV: 93.9 fL (ref 78.0–100.0)
MCV: 95.6 fL (ref 78.0–100.0)
MONO ABS: 0.9 10*3/uL (ref 0.1–1.0)
MONOS PCT: 8 %
MONOS PCT: 8 %
Monocytes Absolute: 1.2 10*3/uL — ABNORMAL HIGH (ref 0.1–1.0)
NEUTROS ABS: 9.1 10*3/uL — AB (ref 1.7–7.7)
NEUTROS PCT: 84 %
NEUTROS PCT: 83 %
Neutro Abs: 12.5 10*3/uL — ABNORMAL HIGH (ref 1.7–7.7)
Platelets: 401 10*3/uL — ABNORMAL HIGH (ref 150–400)
Platelets: 377 10*3/uL (ref 150–400)
RBC: 3.12 MIL/uL — AB (ref 3.87–5.11)
RBC: 2.96 MIL/uL — ABNORMAL LOW (ref 3.87–5.11)
RDW: 16.6 % — ABNORMAL HIGH (ref 11.5–15.5)
RDW: 16.7 % — AB (ref 11.5–15.5)
WBC: 15.1 10*3/uL — ABNORMAL HIGH (ref 4.0–10.5)
WBC: 10.9 10*3/uL — ABNORMAL HIGH (ref 4.0–10.5)

## 2015-08-10 LAB — COMPREHENSIVE METABOLIC PANEL
ALBUMIN: 2.5 g/dL — AB (ref 3.5–5.0)
ALBUMIN: 2.2 g/dL — AB (ref 3.5–5.0)
ALK PHOS: 64 U/L (ref 38–126)
ALK PHOS: 57 U/L (ref 38–126)
ALT: 20 U/L (ref 14–54)
ALT: 17 U/L (ref 14–54)
AST: 21 U/L (ref 15–41)
AST: 17 U/L (ref 15–41)
Anion gap: 11 (ref 5–15)
Anion gap: 14 (ref 5–15)
BILIRUBIN TOTAL: 0.8 mg/dL (ref 0.3–1.2)
BUN: 44 mg/dL — AB (ref 6–20)
BUN: 42 mg/dL — AB (ref 6–20)
CALCIUM: 8.1 mg/dL — AB (ref 8.9–10.3)
CHLORIDE: 102 mmol/L (ref 101–111)
CO2: 24 mmol/L (ref 22–32)
CO2: 21 mmol/L — ABNORMAL LOW (ref 22–32)
CREATININE: 2.41 mg/dL — AB (ref 0.44–1.00)
CREATININE: 2.19 mg/dL — AB (ref 0.44–1.00)
Calcium: 8.7 mg/dL — ABNORMAL LOW (ref 8.9–10.3)
Chloride: 107 mmol/L (ref 101–111)
GFR calc Af Amer: 23 mL/min — ABNORMAL LOW (ref >60)
GFR calc Af Amer: 26 mL/min — ABNORMAL LOW (ref >60)
GFR calc non Af Amer: 20 mL/min — ABNORMAL LOW (ref >60)
GFR, EST NON AFRICAN AMERICAN: 22 mL/min — AB (ref >60)
GLUCOSE: 159 mg/dL — AB (ref 65–99)
Glucose, Bld: 129 mg/dL — ABNORMAL HIGH (ref 65–99)
POTASSIUM: 4.6 mmol/L (ref 3.5–5.1)
Potassium: 4.4 mmol/L (ref 3.5–5.1)
SODIUM: 137 mmol/L (ref 135–145)
Sodium: 142 mmol/L (ref 135–145)
TOTAL PROTEIN: 7.2 g/dL (ref 6.5–8.1)
Total Bilirubin: 0.7 mg/dL (ref 0.3–1.2)
Total Protein: 7.8 g/dL (ref 6.5–8.1)

## 2015-08-10 LAB — I-STAT ARTERIAL BLOOD GAS, ED
Acid-Base Excess: 1 mmol/L (ref 0.0–2.0)
Bicarbonate: 26.5 mEq/L — ABNORMAL HIGH (ref 20.0–24.0)
O2 Saturation: 97 %
PCO2 ART: 45.6 mmHg — AB (ref 35.0–45.0)
PO2 ART: 92 mmHg (ref 80.0–100.0)
TCO2: 28 mmol/L (ref 0–100)
pH, Arterial: 7.373 (ref 7.350–7.450)

## 2015-08-10 LAB — URINALYSIS, ROUTINE W REFLEX MICROSCOPIC
Bilirubin Urine: NEGATIVE
Glucose, UA: NEGATIVE mg/dL
Ketones, ur: NEGATIVE mg/dL
NITRITE: NEGATIVE
Protein, ur: 30 mg/dL — AB
SPECIFIC GRAVITY, URINE: 1.01 (ref 1.005–1.030)
pH: 6.5 (ref 5.0–8.0)

## 2015-08-10 LAB — LACTIC ACID, PLASMA: Lactic Acid, Venous: 1.1 mmol/L (ref 0.5–2.0)

## 2015-08-10 LAB — I-STAT CHEM 8, ED
BUN: 45 mg/dL — AB (ref 6–20)
CHLORIDE: 101 mmol/L (ref 101–111)
Calcium, Ion: 1.08 mmol/L — ABNORMAL LOW (ref 1.13–1.30)
Creatinine, Ser: 2.4 mg/dL — ABNORMAL HIGH (ref 0.44–1.00)
Glucose, Bld: 157 mg/dL — ABNORMAL HIGH (ref 65–99)
HEMATOCRIT: 31 % — AB (ref 36.0–46.0)
Hemoglobin: 10.5 g/dL — ABNORMAL LOW (ref 12.0–15.0)
POTASSIUM: 4.3 mmol/L (ref 3.5–5.1)
SODIUM: 140 mmol/L (ref 135–145)
TCO2: 26 mmol/L (ref 0–100)

## 2015-08-10 LAB — URINE MICROSCOPIC-ADD ON

## 2015-08-10 LAB — I-STAT CG4 LACTIC ACID, ED
LACTIC ACID, VENOUS: 1 mmol/L (ref 0.5–2.0)
LACTIC ACID, VENOUS: 1.03 mmol/L (ref 0.5–2.0)

## 2015-08-10 LAB — I-STAT TROPONIN, ED: TROPONIN I, POC: 0 ng/mL (ref 0.00–0.08)

## 2015-08-10 LAB — POC OCCULT BLOOD, ED: Fecal Occult Bld: NEGATIVE

## 2015-08-10 LAB — PROTIME-INR
INR: 1.36 (ref 0.00–1.49)
Prothrombin Time: 16.9 seconds — ABNORMAL HIGH (ref 11.6–15.2)

## 2015-08-10 LAB — APTT: aPTT: 40 seconds — ABNORMAL HIGH (ref 24–37)

## 2015-08-10 LAB — PROCALCITONIN: Procalcitonin: 1.54 ng/mL

## 2015-08-10 MED ORDER — FENTANYL CITRATE (PF) 100 MCG/2ML IJ SOLN
25.0000 ug | Freq: Once | INTRAMUSCULAR | Status: AC
  Administered 2015-08-10: 25 ug via INTRAVENOUS
  Filled 2015-08-10: qty 2

## 2015-08-10 MED ORDER — ONDANSETRON HCL 4 MG/2ML IJ SOLN
4.0000 mg | Freq: Once | INTRAMUSCULAR | Status: AC
  Administered 2015-08-10: 4 mg via INTRAVENOUS
  Filled 2015-08-10: qty 2

## 2015-08-10 MED ORDER — OXYCODONE-ACETAMINOPHEN 5-325 MG PO TABS
2.0000 | ORAL_TABLET | Freq: Two times a day (BID) | ORAL | Status: DC
  Administered 2015-08-11 – 2015-08-13 (×5): 2 via ORAL
  Filled 2015-08-10 (×5): qty 2

## 2015-08-10 MED ORDER — BISACODYL 10 MG RE SUPP
10.0000 mg | Freq: Every day | RECTAL | Status: DC | PRN
  Administered 2015-08-11 – 2015-08-12 (×2): 10 mg via RECTAL
  Filled 2015-08-10 (×2): qty 1

## 2015-08-10 MED ORDER — DEXTROSE 5 % IV SOLN
1.0000 g | INTRAVENOUS | Status: DC
  Administered 2015-08-10 – 2015-08-12 (×3): 1 g via INTRAVENOUS
  Filled 2015-08-10 (×3): qty 10

## 2015-08-10 MED ORDER — NALOXONE HCL 0.4 MG/ML IJ SOLN
0.4000 mg | Freq: Once | INTRAMUSCULAR | Status: AC
  Administered 2015-08-10: 0.4 mg via INTRAVENOUS
  Filled 2015-08-10: qty 1

## 2015-08-10 MED ORDER — PIPERACILLIN-TAZOBACTAM 3.375 G IVPB: 3.3750 g | Freq: Three times a day (TID) | INTRAVENOUS | Status: DC

## 2015-08-10 MED ORDER — MORPHINE SULFATE ER 30 MG PO TBCR: 60.0000 mg | EXTENDED_RELEASE_TABLET | Freq: Two times a day (BID) | ORAL | Status: DC

## 2015-08-10 MED ORDER — PIPERACILLIN-TAZOBACTAM 3.375 G IVPB 30 MIN
3.3750 g | Freq: Once | INTRAVENOUS | Status: AC
  Administered 2015-08-10: 3.375 g via INTRAVENOUS
  Filled 2015-08-10: qty 50

## 2015-08-10 MED ORDER — NIFEDIPINE ER 30 MG PO TB24
30.0000 mg | ORAL_TABLET | Freq: Every day | ORAL | Status: DC
  Administered 2015-08-11: 30 mg via ORAL
  Filled 2015-08-10 (×2): qty 1

## 2015-08-10 MED ORDER — SENNOSIDES-DOCUSATE SODIUM 8.6-50 MG PO TABS: 1.0000 | ORAL_TABLET | Freq: Every evening | ORAL | Status: DC | PRN

## 2015-08-10 MED ORDER — OXYCODONE HCL 5 MG PO TABS
5.0000 mg | ORAL_TABLET | Freq: Every day | ORAL | Status: DC
  Administered 2015-08-12 – 2015-08-13 (×2): 5 mg via ORAL
  Filled 2015-08-10 (×3): qty 1

## 2015-08-10 MED ORDER — PANTOPRAZOLE SODIUM 40 MG PO TBEC
40.0000 mg | DELAYED_RELEASE_TABLET | Freq: Every day | ORAL | Status: DC
  Administered 2015-08-11 – 2015-08-13 (×3): 40 mg via ORAL
  Filled 2015-08-10 (×3): qty 1

## 2015-08-10 MED ORDER — OXYCODONE-ACETAMINOPHEN 10-325 MG PO TABS: 1.0000 | ORAL_TABLET | Freq: Every day | ORAL | Status: DC

## 2015-08-10 MED ORDER — VANCOMYCIN HCL 10 G IV SOLR
1500.0000 mg | Freq: Once | INTRAVENOUS | Status: AC
  Administered 2015-08-10: 1500 mg via INTRAVENOUS
  Filled 2015-08-10: qty 1500

## 2015-08-10 MED ORDER — OXYCODONE-ACETAMINOPHEN 5-325 MG PO TABS
1.0000 | ORAL_TABLET | Freq: Every day | ORAL | Status: DC
  Administered 2015-08-12 – 2015-08-13 (×2): 1 via ORAL
  Filled 2015-08-10 (×3): qty 1

## 2015-08-10 MED ORDER — PNEUMOCOCCAL VAC POLYVALENT 25 MCG/0.5ML IJ INJ: 0.5000 mL | INJECTION | INTRAMUSCULAR | Status: DC

## 2015-08-10 MED ORDER — SODIUM CHLORIDE 0.9 % IV BOLUS (SEPSIS)
1000.0000 mL | Freq: Once | INTRAVENOUS | Status: AC
  Administered 2015-08-10: 1000 mL via INTRAVENOUS

## 2015-08-10 MED ORDER — MORPHINE SULFATE (PF) 2 MG/ML IV SOLN
1.0000 mg | INTRAVENOUS | Status: DC | PRN
  Administered 2015-08-10 – 2015-08-13 (×3): 1 mg via INTRAVENOUS
  Filled 2015-08-10 (×3): qty 1

## 2015-08-10 MED ORDER — ACETAMINOPHEN 650 MG RE SUPP: 650.0000 mg | Freq: Four times a day (QID) | RECTAL | Status: DC | PRN

## 2015-08-10 MED ORDER — LEVOTHYROXINE SODIUM 125 MCG PO TABS
125.0000 ug | ORAL_TABLET | Freq: Every day | ORAL | Status: DC
  Administered 2015-08-11 – 2015-08-13 (×3): 125 ug via ORAL
  Filled 2015-08-10 (×3): qty 1

## 2015-08-10 MED ORDER — GABAPENTIN 800 MG PO TABS
1600.0000 mg | ORAL_TABLET | Freq: Two times a day (BID) | ORAL | Status: DC
  Filled 2015-08-10: qty 2

## 2015-08-10 MED ORDER — SODIUM CHLORIDE 0.9 % IV SOLN
INTRAVENOUS | Status: DC
  Administered 2015-08-10 – 2015-08-11 (×3): via INTRAVENOUS
  Administered 2015-08-11 – 2015-08-12 (×2): 1000 mL via INTRAVENOUS

## 2015-08-10 MED ORDER — METOPROLOL TARTRATE 100 MG PO TABS
100.0000 mg | ORAL_TABLET | Freq: Every day | ORAL | Status: DC
  Administered 2015-08-11 – 2015-08-12 (×2): 100 mg via ORAL
  Filled 2015-08-10 (×2): qty 1

## 2015-08-10 MED ORDER — DULOXETINE HCL 60 MG PO CPEP
60.0000 mg | ORAL_CAPSULE | Freq: Every day | ORAL | Status: DC
  Administered 2015-08-11 – 2015-08-12 (×2): 60 mg via ORAL
  Filled 2015-08-10 (×2): qty 1

## 2015-08-10 MED ORDER — SODIUM CHLORIDE 0.9 % IV BOLUS (SEPSIS)
500.0000 mL | Freq: Once | INTRAVENOUS | Status: AC
  Administered 2015-08-10: 500 mL via INTRAVENOUS

## 2015-08-10 MED ORDER — HEPARIN SODIUM (PORCINE) 5000 UNIT/ML IJ SOLN
5000.0000 [IU] | Freq: Three times a day (TID) | INTRAMUSCULAR | Status: DC
  Administered 2015-08-10 – 2015-08-13 (×8): 5000 [IU] via SUBCUTANEOUS
  Filled 2015-08-10 (×8): qty 1

## 2015-08-10 MED ORDER — OXYCODONE HCL 5 MG PO TABS
10.0000 mg | ORAL_TABLET | Freq: Two times a day (BID) | ORAL | Status: DC
  Administered 2015-08-11 – 2015-08-13 (×5): 10 mg via ORAL
  Filled 2015-08-10 (×5): qty 2

## 2015-08-10 MED ORDER — FUROSEMIDE 40 MG PO TABS
40.0000 mg | ORAL_TABLET | Freq: Every day | ORAL | Status: DC
  Administered 2015-08-11: 40 mg via ORAL
  Filled 2015-08-10: qty 1

## 2015-08-10 MED ORDER — HYDROCODONE-ACETAMINOPHEN 5-325 MG PO TABS: 1.0000 | ORAL_TABLET | ORAL | Status: DC | PRN

## 2015-08-10 MED ORDER — VANCOMYCIN HCL IN DEXTROSE 750-5 MG/150ML-% IV SOLN: 750.0000 mg | INTRAVENOUS | Status: DC

## 2015-08-10 MED ORDER — MORPHINE SULFATE ER 30 MG PO TBCR
30.0000 mg | EXTENDED_RELEASE_TABLET | Freq: Three times a day (TID) | ORAL | Status: DC
  Administered 2015-08-10 – 2015-08-13 (×5): 30 mg via ORAL
  Filled 2015-08-10 (×7): qty 1

## 2015-08-10 MED ORDER — ONDANSETRON HCL 4 MG PO TABS: 4.0000 mg | ORAL_TABLET | Freq: Four times a day (QID) | ORAL | Status: DC | PRN

## 2015-08-10 MED ORDER — VANCOMYCIN HCL IN DEXTROSE 1-5 GM/200ML-% IV SOLN: 1000.0000 mg | Freq: Once | INTRAVENOUS | Status: DC

## 2015-08-10 MED ORDER — ACETAMINOPHEN 325 MG PO TABS: 650.0000 mg | ORAL_TABLET | Freq: Four times a day (QID) | ORAL | Status: DC | PRN

## 2015-08-10 MED ORDER — ANASTROZOLE 1 MG PO TABS: 1.0000 mg | ORAL_TABLET | Freq: Every day | ORAL | Status: DC

## 2015-08-10 MED ORDER — GABAPENTIN 400 MG PO CAPS
1600.0000 mg | ORAL_CAPSULE | Freq: Two times a day (BID) | ORAL | Status: DC
  Administered 2015-08-10 – 2015-08-13 (×6): 1600 mg via ORAL
  Filled 2015-08-10 (×6): qty 4

## 2015-08-10 MED ORDER — ONDANSETRON HCL 4 MG/2ML IJ SOLN: 4.0000 mg | Freq: Four times a day (QID) | INTRAMUSCULAR | Status: DC | PRN

## 2015-08-10 NOTE — H&P (Signed)
History and Physical    [name] [id number] DOB: 1950/11/22 DOA: 08/10/2015  Referring MD/NP/PA: EDP PCP: [name], MD  Outpatient Specialists: Patient Care Team: [name], MD as PCP - General (Internal Medicine)  Patient coming from:  Home  Chief Complaint: Confusion HPI: [name] is a 65 y.o. female with medical history significant for HTN, HDL, breast cancer not on therapy,hypothyroidism, sacral decubitus ulcer, paraplegia due to cervical surgery in 2006, under HHN, and recent UTI  About 2 months ago, brought by her husband to the ED for evaluation of altered mental status. In review, symptoms began 4 days ago, when she developed low grade fevers up to 100s. At the time, her home health nurse took a urine sample of her indwelling catheter which revealed small amounts of E Coli, but no antibiotics were received at the time. Status was complicated 2 days ago with increased confusion and somnolence, difficult to arouse her since yesterday. She has not been drinking or eating since yesterday morning. Other history is unable to be obtained due to confusion but according to husband she did not have any shortness of breath, chest pain, abdominal pain, nausea or vomiting, or lower extremity swelling prior to admission.  ED Course: On arrival, she had low BP with T max 99 and Osats 88, currently at  BP 106/58 mmHg  Pulse 80  Temp(Src) 99 F (37.2 C) (Rectal)  Resp 12  Wt 77.111 kg (170 lb)  SpO2 100% She received 3 liters of fluid to date. After cultures were obtained, she received IV Vanco and Zosyn.  CT head negative. CXR is NAD.  Lactic Acid is 1. Troponin negative. Blood Gases pH 7.373, pCO2 45.6, pO2 92, Bicarb 26.5. Creat 2.4 (BL 0.9). WBC 15.1 UA large WBC and HBC, neg nitrites She will be admitted for possible sepsis.  Review of Systems: As per HPI otherwise 10 point review of systems negative.   Past Medical History  Diagnosis Date  . Hypertension   . Difficult  intubation     Fastrach #4 LMA then # 7 ETT used in 2006 [facility], cervical laminectomy)  . Hypothyroidism   . Cervical neuropathy     PERIPHERAL NEUROPATHY , HAD CERVICAL FUSION  . PONV (postoperative nausea and vomiting)   . Hypercholesteremia   . Arthritis     "all over" (04/13/2012)  . Breast cancer [facility]) 2011    "right" (04/13/2012)  . Sacral decubitus ulcer 10/2014  . Kidney stones 1990's    "twice; passed in hospital on my own" (04/13/2012)    Past Surgical History  Procedure Laterality Date  . [place] luc  ~ 2003    "benign tumor removed from up under gum" (04/13/2012)  . Posterior cervical laminectomy  2006  . Eye surgery  2004    STENTS TO BIL EYES  . Tonsillectomy and adenoidectomy  ~ 1954  . Abdominal hysterectomy  ~ 1976  . Breast lumpectomy  2011    "right" (04/13/2012)  . Dacrocystorhinostomy  ~ 2000    "put stents in my tear ducts; both eyes" (04/13/2012)  . Total knee arthroplasty with revision components  04/13/2012    Procedure: TOTAL KNEE ARTHROPLASTY WITH REVISION COMPONENTS;  Surgeon: Hessie Dibble, MD;  Location: [facility];  Service: Orthopedics;  Laterality: Left;  . Incision and drainage of wound Left 11/03/2014    Procedure: IRRIGATION AND DEBRIDEMENT SACRAL ULCER;  Surgeon: Irene Limbo, MD;  Location: [facility];  Service: Plastics;  Laterality: Left;  . I&d extremity Bilateral  11/03/2014    Procedure: IRRIGATION AND DEBRIDEMENT BILATERAL HEEL;  Surgeon: Irene Limbo, MD;  Location: [facility];  Service: Plastics;  Laterality: Bilateral;     reports that she has been smoking Cigarettes.  She has a 12 pack-year smoking history. She has never used smokeless tobacco. She reports that she does not drink alcohol or use illicit drugs.  Allergies  Allergen Reactions  . Ivp Dye [Iodinated Diagnostic Agents] Other (See Comments)    "hot and sweaty and almost passed out"  . Aspirin Hives  . Sulfa Antibiotics Hives    Family History  Problem Relation Age of Onset  .  Heart disease Father     Family history reviewed and not pertinent (If you reviewed it)  Prior to Admission medications   Medication Sig Start Date End Date Taking? Authorizing Provider  acetaminophen (TYLENOL) 500 MG tablet Take 500 mg by mouth every 6 (six) hours as needed for mild pain or moderate pain.    Historical Provider, MD  Alpha-D-Galactosidase (BEANO MELTAWAYS PO) Take 1 tablet by mouth daily as needed (stomach pain).    Historical Provider, MD  anastrozole (ARIMIDEX) 1 MG tablet TAKE 1 TABLET BY MOUTH DAILY 01/15/15   Chauncey Cruel, MD  ascorbic acid (VITAMIN C) 500 MG tablet Take 1 tablet (500 mg total) by mouth daily. 11/07/14   Francesca Oman, DO  baclofen (LIORESAL) 10 MG tablet Take 30 mg by mouth 2 (two) times daily. 09/26/14   Historical Provider, MD  cefTAZidime 2 g in dextrose 5 % 50 mL Inject 2 g into the vein every 8 (eight) hours. 11/07/14   Francesca Oman, DO  DULoxetine (CYMBALTA) 60 MG capsule Take 60 mg by mouth daily after supper.     Historical Provider, MD  folic acid (FOLVITE) 1 MG tablet Take 1 mg by mouth daily after supper.     Historical Provider, MD  furosemide (LASIX) 40 MG tablet Take 40 mg by mouth daily after supper.     Historical Provider, MD  gabapentin (NEURONTIN) 800 MG tablet Take 1,600 mg by mouth 2 (two) times daily. 09/22/14   Historical Provider, MD  levothyroxine (SYNTHROID, LEVOTHROID) 125 MCG tablet Take 1 tablet (125 mcg total) by mouth daily before breakfast. 11/07/14   Francesca Oman, DO  methocarbamol (ROBAXIN) 500 MG tablet Take 1 tablet (500 mg total) by mouth every 6 (six) hours as needed. 04/15/12   Roselee Nova, PA-C  metoprolol (LOPRESSOR) 100 MG tablet Take 100 mg by mouth daily after supper. 10/28/14   Historical Provider, MD  morphine (MS CONTIN) 60 MG 12 hr tablet Take 60 mg by mouth every 12 (twelve) hours. 10/10/14   Historical Provider, MD  Multiple Vitamin (MULTIVITAMIN WITH MINERALS) TABS tablet Take 1 tablet by mouth daily. 11/07/14    Francesca Oman, DO  NIFEdipine (PROCARDIA XL/ADALAT-CC) 30 MG 24 hr tablet Take 30 mg by mouth daily.      Historical Provider, MD  oxyCODONE-acetaminophen (PERCOCET) 10-325 MG per tablet Take 1 tablet by mouth 6 (six) times daily. 10/10/14   Historical Provider, MD  pantoprazole (PROTONIX) 40 MG tablet Take 1 tablet (40 mg total) by mouth daily. 11/07/14   Francesca Oman, DO  Simethicone (GAS-X EXTRA STRENGTH) 125 MG CAPS Take 125 mg by mouth daily as needed (stomach pain).    Historical Provider, MD  zinc sulfate 220 MG capsule Take 1 capsule (220 mg total) by mouth daily. 11/07/14   Francesca Oman, DO  Physical Exam:    Filed Vitals:   08/10/15 0945 08/10/15 1034 08/10/15 1045 08/10/15 1222  BP: 95/59 91/49 101/54 106/58  Pulse: 88 81 84 80  Temp:      TempSrc:      Resp: 20 12 12 12   Weight:      SpO2: 100% 97% 100% 100%      Constitutional: lethargic, confused, rouses to sound and touch  Filed Vitals:   08/10/15 0945 08/10/15 1034 08/10/15 1045 08/10/15 1222  BP: 95/59 91/49 101/54 106/58  Pulse: 88 81 84 80  Temp:      TempSrc:      Resp: 20 12 12 12   Weight:      SpO2: 100% 97% 100% 100%   Eyes: PERRL, lids and conjunctivae normal ENMT: Mucous membranes are moist.  Unable to visualize Posterior pharynx as patient will not follow commands at this timeNormal dentition.  Neck: normal, supple, no masses, no thyromegaly Respiratory: clear to auscultation bilaterally, no wheezing, no crackles. Normal respiratory effort. No accessory muscle use.  Cardiovascular: Regular rate and rhythm, no murmurs / rubs / gallops. No extremity edema. 2+ pedal pulses. No carotid bruits.  Abdomen: no tenderness, no masses palpated. No hepatosplenomegaly. Bowel sounds positive. No apparent suprapubic tenderness. Foley to gravity (changed today) Musculoskeletal: no clubbing / cyanosis. No joint deformity upper and lower extremities. Patient is paraplegic Skin: She has a known deep tissue decubitus  sacra ulcer, with gauze. No induration.  Neurologic:  Sensation intact, patient is paraplegic on her legs Psychiatric:  Cannot assess, patient is confused at this time, cannot follow commands. Lethargic.   Labs on Admission: I have personally reviewed following labs and imaging studies  CBC:  Recent Labs Lab 08/10/15 0706 08/10/15 0716  WBC 15.1*  --   NEUTROABS 12.5*  --   HGB 9.2* 10.5*  HCT 29.3* 31.0*  MCV 93.9  --   PLT 401*  --     Basic Metabolic Panel:  Recent Labs Lab 08/10/15 0706 08/10/15 0716  NA 137 140  K 4.4 4.3  CL 102 101  CO2 24  --   GLUCOSE 159* 157*  BUN 44* 45*  CREATININE 2.41* 2.40*  CALCIUM 8.7*  --     GFR: Estimated Creatinine Clearance: 22.7 mL/min (by C-G formula based on Cr of 2.4).  Liver Function Tests:  Recent Labs Lab 08/10/15 0706  AST 21  ALT 20  ALKPHOS 64  BILITOT 0.7  PROT 7.8  ALBUMIN 2.5*    Urine analysis:    Component Value Date/Time   COLORURINE YELLOW 08/10/2015 0945   APPEARANCEUR TURBID* 08/10/2015 0945   LABSPEC 1.010 08/10/2015 0945   PHURINE 6.5 08/10/2015 0945   GLUCOSEU NEGATIVE 08/10/2015 0945   HGBUR LARGE* 08/10/2015 0945   BILIRUBINUR NEGATIVE 08/10/2015 0945   KETONESUR NEGATIVE 08/10/2015 0945   PROTEINUR 30* 08/10/2015 0945   UROBILINOGEN 1.0 11/01/2014 1127   NITRITE NEGATIVE 08/10/2015 0945   LEUKOCYTESUR LARGE* 08/10/2015 0945    Sepsis Labs: @LABRCNTIP (procalcitonin:4,lacticidven:4) )No results found for this or any previous visit (from the past 240 hour(s)).   Radiological Exams on Admission: Ct Abdomen Pelvis Wo Contrast  08/10/2015  CLINICAL DATA:  Altered mental status. History of paralysis with chronic urinary tract infection. Bed sore. EXAM: CT ABDOMEN AND PELVIS WITHOUT CONTRAST TECHNIQUE: Multidetector CT imaging of the abdomen and pelvis was performed following the standard protocol without IV contrast. COMPARISON:  11/01/2014 FINDINGS: The lack of intravenous contrast  limits the ability to  evaluate solid abdominal organs. Lower chest: Evaluation of the lower thorax is degraded secondary to patient respiratory artifact. Minimal basilar dependent subpleural ground-glass atelectasis, right greater than left. There is minimal subsegmental axis within the left costophrenic angle. No focal airspace opacities. Cardiomegaly. Calcifications within the aortic valve annulus. There is diffuse decreased attenuation intra cardiac blood pool suggestive of anemia. Hepatobiliary: Normal hepatic contour. There are layering gallstones within a distended but otherwise normal-appearing gallbladder. No definitive pericholecystic fluid on this noncontrast examination. No ascites. Pancreas: Normal noncontrast appearance of the pancreas Spleen: Punctate granuloma within otherwise normal-appearing spleen. Adrenals/Urinary Tract: There is a punctate (approximately 6 mm) stone within the superior aspect of the left ureter which results in mild upstream ureterectasis and pelvicaliectasis and asymmetric left-sided perinephric stranding. Multiple additional nonobstructing renal stones are seen bilaterally with 2 nonobstructing stones seen within the inferior pole the left kidney with dominant stone measuring 0.6 cm cholelithiasis without evidence image 95, series 6) and 3 nonobstructing stones within the inferior pole the right kidney with dominant stone measuring approximately 0.9 cm (axial image 44, series 3). No renal stones are seen along the expected course of the right ureter. The urinary bladder is decompressed with a Foley catheter. Normal noncontrast appearance of the bilateral adrenal glands. Stomach/Bowel: Large colonic stool burden without evidence of enteric obstruction. Remote noncontrast appearance of the terminal ileum and retrocecal appendix. No pneumoperitoneum, pneumatosis or portal venous gas. Vascular/Lymphatic: Scattered atherosclerotic plaque within a normal caliber abdominal aorta.  Scattered retroperitoneal lymph nodes are numerous though individually not enlarged by size criteria with index left-sided periaortic lymph node measuring 0.8 cm in greatest short axis diameter (image 40, series 3). No bulky retroperitoneal, mesenteric, pelvic or inguinal lymphadenopathy on this noncontrast examination. Reproductive: Normal noncontrast appearance of the prostate. Several phleboliths are seen within lower pelvis bilaterally. No free fluid in the pelvic cul-de-sac. Other: There is a large pressure sore involving the left buttocks which extends to expose the left ischial tuberosity (image 94, series 3), progressed since the 10/2018 10/2014 examination. While exposure of the bone would indicate osteomyelitis, there is no definitive evidence of discrete osteolysis. Small mesenteric fat containing periumbilical hernia. Small right-sided inguinal hernia. Musculoskeletal: No acute or aggressive osseous abnormalities. Stigmata of DISH within the thoracic spine. Moderate severe bilateral lower lumbar spine facet degenerative change. Degenerative change of the bilateral hips. IMPRESSION: 1. Punctate (approximately 6 mm) stone within superior aspect of the left ureter results in mild upstream ureterectasis and pelvicaliectasis. 2. Additional bilateral nonobstructing nephrolithiasis as above. No evidence of right-sided urinary obstruction. 3. Cholelithiasis without evidence of acute cholecystitis on this noncontrast examination. 4. Large pressure sore involving the left buttocks which has progressed since the 10/2014 examination and now extends to expose the left ischial tuberosity. Exposure of the bone would indicate osteomyelitis, though there is no discrete evidence of osteolysis on this examination. Electronically Signed   By: [name] M.D.   On: 08/10/2015 11:42   Ct Head Wo Contrast  08/10/2015  CLINICAL DATA:  Altered mental status. EXAM: CT HEAD WITHOUT CONTRAST TECHNIQUE: Contiguous axial images  were obtained from the base of the skull through the vertex without intravenous contrast. COMPARISON:  None. FINDINGS: Bony calvarium appears intact. Minimal chronic ischemic white matter disease is noted. No mass effect or midline shift is noted. Ventricular size is within normal limits. There is no evidence of mass lesion, hemorrhage or acute infarction. IMPRESSION: Minimal chronic ischemic white matter disease. No acute intracranial abnormality seen. Electronically Signed  By: Marijo Conception, M.D.   On: 08/10/2015 08:27   Dg Chest Port 1 View  08/10/2015  CLINICAL DATA:  Suspected sepsis. EXAM: PORTABLE CHEST 1 VIEW COMPARISON:  04/15/2012 FINDINGS: Low lung volumes. Mild cardiomegaly. Linear densities at the lung bases likely reflect atelectasis. No confluent opacities otherwise. No effusions or acute bony abnormality. IMPRESSION: Mild cardiomegaly.  Low lung volumes with bibasilar atelectasis. Electronically Signed   By: Rolm Baptise M.D.   On: 08/10/2015 07:35     CT ABDOMEN AND PELVIS WITHOUT CONTRAST  TECHNIQUE: Multidetector CT imaging of the abdomen and pelvis was performed following the standard protocol without IV contrast.  COMPARISON: 11/01/2014  FINDINGS: The lack of intravenous contrast limits the ability to evaluate solid abdominal organs.  Lower chest: Evaluation of the lower thorax is degraded secondary to patient respiratory artifact. Minimal basilar dependent subpleural ground-glass atelectasis, right greater than left. There is minimal subsegmental axis within the left costophrenic angle. No focal airspace opacities.  Cardiomegaly. Calcifications within the aortic valve annulus. There is diffuse decreased attenuation intra cardiac blood pool suggestive of anemia.  Hepatobiliary: Normal hepatic contour. There are layering gallstones within a distended but otherwise normal-appearing gallbladder. No definitive pericholecystic fluid on this noncontrast  examination. No ascites.  Pancreas: Normal noncontrast appearance of the pancreas  Spleen: Punctate granuloma within otherwise normal-appearing spleen.  Adrenals/Urinary Tract: There is a punctate (approximately 6 mm) stone within the superior aspect of the left ureter which results in mild upstream ureterectasis and pelvicaliectasis and asymmetric left-sided perinephric stranding.  Multiple additional nonobstructing renal stones are seen bilaterally with 2 nonobstructing stones seen within the inferior pole the left kidney with dominant stone measuring 0.6 cm cholelithiasis without evidence image 95, series 6) and 3 nonobstructing stones within the inferior pole the right kidney with dominant stone measuring approximately 0.9 cm (axial image 44, series 3). No renal stones are seen along the expected course of the right ureter. The urinary bladder is decompressed with a Foley catheter.  Normal noncontrast appearance of the bilateral adrenal glands.  Stomach/Bowel: Large colonic stool burden without evidence of enteric obstruction. Remote noncontrast appearance of the terminal ileum and retrocecal appendix. No pneumoperitoneum, pneumatosis or portal venous gas.  Vascular/Lymphatic: Scattered atherosclerotic plaque within a normal caliber abdominal aorta.  Scattered retroperitoneal lymph nodes are numerous though individually not enlarged by size criteria with index left-sided periaortic lymph node measuring 0.8 cm in greatest short axis diameter (image 40, series 3). No bulky retroperitoneal, mesenteric, pelvic or inguinal lymphadenopathy on this noncontrast examination.  Reproductive: Normal noncontrast appearance of the prostate. Several phleboliths are seen within lower pelvis bilaterally. No free fluid in the pelvic cul-de-sac.  Other: There is a large pressure sore involving the left buttocks which extends to expose the left ischial tuberosity (image  94, series 3), progressed since the 10/2018 10/2014 examination. While exposure of the bone would indicate osteomyelitis, there is no definitive evidence of discrete osteolysis. Small mesenteric fat containing periumbilical hernia. Small right-sided inguinal hernia.  Musculoskeletal: No acute or aggressive osseous abnormalities. Stigmata of DISH within the thoracic spine. Moderate severe bilateral lower lumbar spine facet degenerative change. Degenerative change of the bilateral hips.  IMPRESSION: 1. Punctate (approximately 6 mm) stone within superior aspect of the left ureter results in mild upstream ureterectasis and pelvicaliectasis. 2. Additional bilateral nonobstructing nephrolithiasis as above. No evidence of right-sided urinary obstruction. 3. Cholelithiasis without evidence of acute cholecystitis on this noncontrast examination. 4. Large pressure sore involving the left buttocks which  has progressed since the 10/2014 examination and now extends to expose the left ischial tuberosity. Exposure of the bone would indicate osteomyelitis, though there is no discrete evidence of osteolysis on this examination.  EKG: Independently reviewed.  Assessment/Plan Active Problems:   HTN (hypertension)   Breast cancer of lower-inner quadrant of right female breast (HCC)   Sacral ulcer   Hypothyroidism   Paraplegia (HCC)   Sacral decubitus ulcer   AKI (acute kidney injury) (HCC)   Sepsis (HCC)   Hypotension   Altered mental status   Leukocytosis    Presumed Sepsis in the setting of  Possible E Coli UTI,  pressure ulcer with progression, with exposed left ischial tuberosity, without evidence of ostelysis .She has acute mental status changes,  hypoxia, hypotension. Lact Acid is 1. CX UA pending. Foley has been changed at the ED. T max 99   CBC remarkable for leukocytosis WBC 15.1 patient's ABG notable for pH 7.373, pCO2 45.6, pO2 92, Bicarb 26.5. Will place patient on oxygen and  titrate O2 amount and delivery system as needed. She is on 2l [place]  Received   3l NS to date. Antibiotics with Vanco./ Zosyn delivered in the ED Admit to tele obs after discussion with Case Manager Sepsis order set  IV antibiotics by pharmacy with Rocephin  Follow lactic acid Follow blood and urine cultures IV fluids at 100 cc/h.  Procalcitonin  Acute encephalopathy due to above CT of the head without acute abnormality. No metabolic derangement.   Continue antibiotics and IVF  Await for pancultures  Hypotension:  Likely due to dehydration in the setting of possible sepsis, improving after initiation of IVF, 3 liters to date.  BP 106/58 mmHg  Pulse 80  Temp(Src) 99 F (37.2 C) (Rectal)  Resp 12  Wt 77.111 kg (170 lb)  SpO2 100% Continue hydration as above Hold BP meds until BP normalized  Leukocytosis, likely related to underlying infection. CXR NAD -  abx as above -  Repeat WBC in AM  Acute Kidney Injury likely due to Volume depletionBL Cr  0.9 Current Cr 2.48 Hold Vanc   IVF. Strict I/O and daily weights  Follow Cr in am.  Minimize nephrotoxins and avoid ACEI/ARB. Hold ACEI and Demadex until AKI failure resolves. Renally dose medications.  Depression Continue Cymbalta  Hyperlipidemia Continue home statins  Hypothyroidism: Chronic.  -Continue home Synthroid  Right breast Cancer, not on therapy To follow as outpatient  sacral decubitus ulcer due to decreased mobility due to paraplegia, with deconditioning. CT abdomen and pelvis pressure ulcer with progression, with exposed left ischial tuberosity, without evidence of ostelysis . Wound care  Consult PT/OT to help patient rotate   DVT prophylaxis:  Heparin  Code Status:   Full   Family Communication:  Discussed with husband  Disposition Plan: Expect patient to be discharged to home Consults called:   PT/OT, Wound care  Admission status:  Obs Tele    Jemell Town E, PA-C [facility]   If 7PM-7AM,  please contact night-coverage [email or web address] Password [facility]  08/10/2015, 12:47 PM

## 2015-08-10 NOTE — Consult Note (Addendum)
WOC wound consult note Reason for Consult: Consult requested for left ischium chronic pressure injury.  Pt was previously followed by the plastics team and is currently followed by the outpatient wound care center.  She has been using a Vac at home which was removed before admission, according to her family member at the bedside. 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  Pt can resume Vac after discharge and follow up with the outpatient wound care center. Family members agree with this plan of care. Please re-consult if further assistance is needed.  Thank-you,  Julien Girt MSN, [place], [place], [place], [place]

## 2015-08-10 NOTE — ED Notes (Signed)
Code sepsis paged out @ [phone].

## 2015-08-10 NOTE — ED Notes (Signed)
Patient presents via EMS from home. Husband states Monday she started not acting right and urine was checked and he was told she had a "small amount of ecoli" was noted in the urine.  Husband stated through out the day yesterday she started getting sleepy and slightly confused.  Patient had neck surgery in 2006 and ended up with paralysis now has chronic UTI's with foley sore on buttock

## 2015-08-10 NOTE — Progress Notes (Signed)
Pharmacy Antibiotic Note  [name] is a 65 y.o. female admitted on 08/10/2015 with sepsis.  Pharmacy has been consulted for vancomycin and zosyn dosing. Pt is afebrile and WBC is elevated at 15.1. Scr is elevated above baseline at 2.41. Lactic acid is 1.   Plan: - Vancomycin 1500mg  IV x 1 then 750mg  IV Q24H - Zosyn 3.375gm IV Q8H (4 hr inf) - F/u renal fxn, C&S, clinical status and trough at SS  Weight: 170 lb (77.111 kg)  Temp (24hrs), Avg:99 F (37.2 C), Min:99 F (37.2 C), Max:99 F (37.2 C)   Recent Labs Lab 08/10/15 0706 08/10/15 0716 08/10/15 0717  WBC 15.1*  --   --   CREATININE 2.41* 2.40*  --   LATICACIDVEN  --   --  1.00    Estimated Creatinine Clearance: 22.7 mL/min (by C-G formula based on Cr of 2.4).    Allergies  Allergen Reactions  . Ivp Dye [Iodinated Diagnostic Agents] Other (See Comments)    "hot and sweaty and almost passed out"  . Aspirin Hives  . Sulfa Antibiotics Hives    Antimicrobials this admission: Vanc 5/5>> Zosyn 5/5>>  Dose adjustments this admission: N/A  Microbiology results: Pending  Thank you for allowing pharmacy to be a part of this patient's care.  Takelia Urieta, Rande Lawman 08/10/2015 8:09 AM

## 2015-08-10 NOTE — ED Notes (Signed)
Patient takes PO morphine and oxycodone for chronic pain.

## 2015-08-10 NOTE — ED Provider Notes (Signed)
CSN: [id number]     Arrival date & time 08/10/15  [id number] History   First MD Initiated Contact with Patient 08/10/15 [phone]     No chief complaint on file.    (Consider location/radiation/quality/duration/timing/severity/associated sxs/prior Treatment) HPI   [name] Is a 65 year old female brought in by her husband for altered mental status. Level 5 caveat due to AMS.She is a past medical history of hypertension, hypothyroid, breast cancer, sacral decubitus ulcer, paraplegia secondary to cervical surgery. She is currently under the care of advanced home [facility] for wound management of a large sacral ulcer. She has a chronic indwelling Foley catheter. Her husband states that Monday and Tuesday. She was running a fever. Her home care nurse took a sample and they found. Trace amounts of Escherichia coli. She was not given any antibiotics by her primary care physician at that time. Her husband states that Wednesday, 2 days ago the patient began acting confused and "talking out of her head." He states that yesterday it became worse with more confusion and then advanced to somnolence. He states she has been very difficult to arouse over the past day or so.    Past Medical History  Diagnosis Date  . Hypertension   . Difficult intubation     Fastrach #4 LMA then # 7 ETT used in 2006 [facility], cervical laminectomy)  . Hypothyroidism   . Cervical neuropathy     PERIPHERAL NEUROPATHY , HAD CERVICAL FUSION  . PONV (postoperative nausea and vomiting)   . Hypercholesteremia   . Arthritis     "all over" (04/13/2012)  . Breast cancer [facility]) 2011    "right" (04/13/2012)  . Sacral decubitus ulcer 10/2014  . Kidney stones 1990's    "twice; passed in hospital on my own" (04/13/2012)   Past Surgical History  Procedure Laterality Date  . [place] luc  ~ 2003    "benign tumor removed from up under gum" (04/13/2012)  . Posterior cervical laminectomy  2006  . Eye surgery  2004    STENTS TO BIL EYES  .  Tonsillectomy and adenoidectomy  ~ 1954  . Abdominal hysterectomy  ~ 1976  . Breast lumpectomy  2011    "right" (04/13/2012)  . Dacrocystorhinostomy  ~ 2000    "put stents in my tear ducts; both eyes" (04/13/2012)  . Total knee arthroplasty with revision components  04/13/2012    Procedure: TOTAL KNEE ARTHROPLASTY WITH REVISION COMPONENTS;  Surgeon: Hessie Dibble, MD;  Location: [place];  Service: Orthopedics;  Laterality: Left;  . Incision and drainage of wound Left 11/03/2014    Procedure: IRRIGATION AND DEBRIDEMENT SACRAL ULCER;  Surgeon: Irene Limbo, MD;  Location: [place];  Service: Plastics;  Laterality: Left;  . I&d extremity Bilateral 11/03/2014    Procedure: IRRIGATION AND DEBRIDEMENT BILATERAL HEEL;  Surgeon: Irene Limbo, MD;  Location: [place];  Service: Plastics;  Laterality: Bilateral;   Family History  Problem Relation Age of Onset  . Heart disease Father    Social History  Substance Use Topics  . Smoking status: Light Tobacco Smoker -- 0.25 packs/day for 48 years    Types: Cigarettes  . Smokeless tobacco: Never Used  . Alcohol Use: No   OB History    No data available     Review of Systems  Unable to review systems due to AMS  Allergies  Ivp dye; Aspirin; and Sulfa antibiotics  Home Medications   Prior to Admission medications   Medication Sig Start Date End Date Taking?  Authorizing Provider  acetaminophen (TYLENOL) 500 MG tablet Take 500 mg by mouth every 6 (six) hours as needed for mild pain or moderate pain.    Historical Provider, MD  Alpha-D-Galactosidase (BEANO MELTAWAYS PO) Take 1 tablet by mouth daily as needed (stomach pain).    Historical Provider, MD  anastrozole (ARIMIDEX) 1 MG tablet TAKE 1 TABLET BY MOUTH DAILY 01/15/15   Chauncey Cruel, MD  ascorbic acid (VITAMIN C) 500 MG tablet Take 1 tablet (500 mg total) by mouth daily. 11/07/14   Francesca Oman, DO  baclofen (LIORESAL) 10 MG tablet Take 30 mg by mouth 2 (two) times daily. 09/26/14    Historical Provider, MD  cefTAZidime 2 g in dextrose 5 % 50 mL Inject 2 g into the vein every 8 (eight) hours. 11/07/14   Francesca Oman, DO  DULoxetine (CYMBALTA) 60 MG capsule Take 60 mg by mouth daily after supper.     Historical Provider, MD  folic acid (FOLVITE) 1 MG tablet Take 1 mg by mouth daily after supper.     Historical Provider, MD  furosemide (LASIX) 40 MG tablet Take 40 mg by mouth daily after supper.     Historical Provider, MD  gabapentin (NEURONTIN) 800 MG tablet Take 1,600 mg by mouth 2 (two) times daily. 09/22/14   Historical Provider, MD  levothyroxine (SYNTHROID, LEVOTHROID) 125 MCG tablet Take 1 tablet (125 mcg total) by mouth daily before breakfast. 11/07/14   Francesca Oman, DO  methocarbamol (ROBAXIN) 500 MG tablet Take 1 tablet (500 mg total) by mouth every 6 (six) hours as needed. 04/15/12   Roselee Nova, PA-C  metoprolol (LOPRESSOR) 100 MG tablet Take 100 mg by mouth daily after supper. 10/28/14   Historical Provider, MD  morphine (MS CONTIN) 60 MG 12 hr tablet Take 60 mg by mouth every 12 (twelve) hours. 10/10/14   Historical Provider, MD  Multiple Vitamin (MULTIVITAMIN WITH MINERALS) TABS tablet Take 1 tablet by mouth daily. 11/07/14   Francesca Oman, DO  NIFEdipine (PROCARDIA XL/ADALAT-CC) 30 MG 24 hr tablet Take 30 mg by mouth daily.      Historical Provider, MD  oxyCODONE-acetaminophen (PERCOCET) 10-325 MG per tablet Take 1 tablet by mouth 6 (six) times daily. 10/10/14   Historical Provider, MD  pantoprazole (PROTONIX) 40 MG tablet Take 1 tablet (40 mg total) by mouth daily. 11/07/14   Francesca Oman, DO  Simethicone (GAS-X EXTRA STRENGTH) 125 MG CAPS Take 125 mg by mouth daily as needed (stomach pain).    Historical Provider, MD  zinc sulfate 220 MG capsule Take 1 capsule (220 mg total) by mouth daily. 11/07/14   Francesca Oman, DO   There were no vitals taken for this visit. Physical Exam  Constitutional: She appears well-developed and well-nourished. She appears lethargic.   HENT:  Head: Normocephalic and atraumatic.  Eyes:  Crusting alon BL lashes  Neck: Normal range of motion. No JVD present.  Cardiovascular: Normal rate.   Pulmonary/Chest:  Sonorous respirations  Abdominal: Soft. Bowel sounds are normal. She exhibits no distension.  Musculoskeletal:  Deep sacral ulcer left buttock, visible Joint capsule. Appears well healing   Neurological: She appears lethargic. GCS eye subscore is 2. GCS verbal subscore is 4. GCS motor subscore is 5.  Nursing note and vitals reviewed.      ED Course  Procedures (including critical care time) Labs Review Labs Reviewed - No data to display  Imaging Review No results found. I have personally reviewed and evaluated  these images and lab results as part of my medical decision-making.   EKG Interpretation None      MDM   Final diagnoses:  Acute kidney injury (HCC)  Encephalopathy acute    7:28 AM BP 99/57 mmHg  Pulse 90  Temp(Src) 99 F (37.2 C) (Rectal)  Resp 18  Wt 77.111 kg  SpO2 88% Patient with hypotension and low 02 sats. Hx breast cancer. encephalopatheinc   9:37 AM BP 98/52 mmHg  Pulse 84  Temp(Src) 99 F (37.2 C) (Rectal)  Resp 11  Wt 77.111 kg  SpO2 99%  She began c/o abdominal pain. But unable to answer questions clearly.  Her  Passed gass multiple times during foley change. She is persistently but stably hypotensive throughout ED visit. The patient will be admitted for hypotension, encephalotpathy and AKI with marked rise in her cr. Ct abdomen is pending.  Margarita Mail, PA-C 08/18/15 0345  Gareth Morgan, MD 08/20/15 [phone]

## 2015-08-10 NOTE — ED Notes (Signed)
O2 via [place] at 2liters applied

## 2015-08-11 DIAGNOSIS — I9589 Other hypotension: Secondary | ICD-10-CM | POA: Diagnosis present

## 2015-08-11 DIAGNOSIS — I119 Hypertensive heart disease without heart failure: Secondary | ICD-10-CM | POA: Diagnosis present

## 2015-08-11 DIAGNOSIS — Z87442 Personal history of urinary calculi: Secondary | ICD-10-CM | POA: Diagnosis not present

## 2015-08-11 DIAGNOSIS — G9340 Encephalopathy, unspecified: Secondary | ICD-10-CM | POA: Diagnosis present

## 2015-08-11 DIAGNOSIS — Z853 Personal history of malignant neoplasm of breast: Secondary | ICD-10-CM | POA: Diagnosis not present

## 2015-08-11 DIAGNOSIS — E869 Volume depletion, unspecified: Secondary | ICD-10-CM | POA: Diagnosis present

## 2015-08-11 DIAGNOSIS — L89154 Pressure ulcer of sacral region, stage 4: Secondary | ICD-10-CM | POA: Diagnosis present

## 2015-08-11 DIAGNOSIS — Z96652 Presence of left artificial knee joint: Secondary | ICD-10-CM | POA: Diagnosis present

## 2015-08-11 DIAGNOSIS — G8220 Paraplegia, unspecified: Secondary | ICD-10-CM | POA: Diagnosis present

## 2015-08-11 DIAGNOSIS — R400 Somnolence: Secondary | ICD-10-CM | POA: Diagnosis not present

## 2015-08-11 DIAGNOSIS — G9341 Metabolic encephalopathy: Secondary | ICD-10-CM | POA: Diagnosis present

## 2015-08-11 DIAGNOSIS — F329 Major depressive disorder, single episode, unspecified: Secondary | ICD-10-CM | POA: Diagnosis present

## 2015-08-11 DIAGNOSIS — B9620 Unspecified Escherichia coli [E. coli] as the cause of diseases classified elsewhere: Secondary | ICD-10-CM | POA: Diagnosis present

## 2015-08-11 DIAGNOSIS — I10 Essential (primary) hypertension: Secondary | ICD-10-CM | POA: Diagnosis not present

## 2015-08-11 DIAGNOSIS — A419 Sepsis, unspecified organism: Secondary | ICD-10-CM | POA: Diagnosis present

## 2015-08-11 DIAGNOSIS — E038 Other specified hypothyroidism: Secondary | ICD-10-CM | POA: Diagnosis present

## 2015-08-11 DIAGNOSIS — E785 Hyperlipidemia, unspecified: Secondary | ICD-10-CM | POA: Diagnosis present

## 2015-08-11 DIAGNOSIS — G8221 Paraplegia, complete: Secondary | ICD-10-CM | POA: Diagnosis present

## 2015-08-11 DIAGNOSIS — Z23 Encounter for immunization: Secondary | ICD-10-CM | POA: Diagnosis not present

## 2015-08-11 DIAGNOSIS — N178 Other acute kidney failure: Secondary | ICD-10-CM | POA: Diagnosis present

## 2015-08-11 DIAGNOSIS — E039 Hypothyroidism, unspecified: Secondary | ICD-10-CM | POA: Diagnosis present

## 2015-08-11 DIAGNOSIS — K5903 Drug induced constipation: Secondary | ICD-10-CM | POA: Diagnosis present

## 2015-08-11 DIAGNOSIS — N202 Calculus of kidney with calculus of ureter: Secondary | ICD-10-CM | POA: Diagnosis present

## 2015-08-11 DIAGNOSIS — F17210 Nicotine dependence, cigarettes, uncomplicated: Secondary | ICD-10-CM | POA: Diagnosis present

## 2015-08-11 DIAGNOSIS — T402X5A Adverse effect of other opioids, initial encounter: Secondary | ICD-10-CM | POA: Diagnosis present

## 2015-08-11 DIAGNOSIS — N179 Acute kidney failure, unspecified: Secondary | ICD-10-CM | POA: Diagnosis not present

## 2015-08-11 DIAGNOSIS — N390 Urinary tract infection, site not specified: Secondary | ICD-10-CM | POA: Diagnosis present

## 2015-08-11 DIAGNOSIS — Z981 Arthrodesis status: Secondary | ICD-10-CM | POA: Diagnosis not present

## 2015-08-11 DIAGNOSIS — I959 Hypotension, unspecified: Secondary | ICD-10-CM | POA: Diagnosis present

## 2015-08-11 LAB — COMPREHENSIVE METABOLIC PANEL
ALT: 16 U/L (ref 14–54)
ANION GAP: 14 (ref 5–15)
AST: 14 U/L — ABNORMAL LOW (ref 15–41)
Albumin: 2.2 g/dL — ABNORMAL LOW (ref 3.5–5.0)
Alkaline Phosphatase: 56 U/L (ref 38–126)
BUN: 40 mg/dL — ABNORMAL HIGH (ref 6–20)
CHLORIDE: 107 mmol/L (ref 101–111)
CO2: 21 mmol/L — AB (ref 22–32)
Calcium: 8.3 mg/dL — ABNORMAL LOW (ref 8.9–10.3)
Creatinine, Ser: 1.89 mg/dL — ABNORMAL HIGH (ref 0.44–1.00)
GFR calc non Af Amer: 27 mL/min — ABNORMAL LOW (ref >60)
GFR, EST AFRICAN AMERICAN: 31 mL/min — AB (ref >60)
Glucose, Bld: 109 mg/dL — ABNORMAL HIGH (ref 65–99)
Potassium: 4.5 mmol/L (ref 3.5–5.1)
SODIUM: 142 mmol/L (ref 135–145)
Total Bilirubin: 0.4 mg/dL (ref 0.3–1.2)
Total Protein: 7.3 g/dL (ref 6.5–8.1)

## 2015-08-11 LAB — CBC
HEMATOCRIT: 24.4 % — AB (ref 36.0–46.0)
HEMOGLOBIN: 7.5 g/dL — AB (ref 12.0–15.0)
MCH: 29.1 pg (ref 26.0–34.0)
MCHC: 30.7 g/dL (ref 30.0–36.0)
MCV: 94.6 fL (ref 78.0–100.0)
Platelets: 384 10*3/uL (ref 150–400)
RBC: 2.58 MIL/uL — AB (ref 3.87–5.11)
RDW: 16.8 % — ABNORMAL HIGH (ref 11.5–15.5)
WBC: 10.5 10*3/uL (ref 4.0–10.5)

## 2015-08-11 LAB — URINE CULTURE

## 2015-08-11 MED ORDER — MORPHINE SULFATE (PF) 2 MG/ML IV SOLN
1.0000 mg | Freq: Once | INTRAVENOUS | Status: AC
  Administered 2015-08-11: 1 mg via INTRAVENOUS
  Filled 2015-08-11: qty 1

## 2015-08-11 MED ORDER — LORAZEPAM 0.5 MG PO TABS
0.5000 mg | ORAL_TABLET | Freq: Once | ORAL | Status: AC
  Administered 2015-08-11: 0.5 mg via ORAL
  Filled 2015-08-11: qty 1

## 2015-08-11 MED ORDER — ALFUZOSIN HCL ER 10 MG PO TB24
10.0000 mg | ORAL_TABLET | Freq: Every day | ORAL | Status: DC
  Administered 2015-08-12 – 2015-08-13 (×2): 10 mg via ORAL
  Filled 2015-08-11 (×2): qty 1

## 2015-08-11 MED ORDER — POLYETHYLENE GLYCOL 3350 17 G PO PACK
17.0000 g | PACK | Freq: Two times a day (BID) | ORAL | Status: DC
  Administered 2015-08-11 – 2015-08-12 (×3): 17 g via ORAL
  Filled 2015-08-11 (×3): qty 1

## 2015-08-11 MED ORDER — BISACODYL 10 MG RE SUPP
10.0000 mg | Freq: Once | RECTAL | Status: AC
  Administered 2015-08-11: 10 mg via RECTAL
  Filled 2015-08-11: qty 1

## 2015-08-11 MED ORDER — NIFEDIPINE ER OSMOTIC RELEASE 30 MG PO TB24
30.0000 mg | ORAL_TABLET | Freq: Every day | ORAL | Status: DC
  Administered 2015-08-12 – 2015-08-13 (×2): 30 mg via ORAL
  Filled 2015-08-11 (×2): qty 1

## 2015-08-11 NOTE — Progress Notes (Signed)
Occupational Therapy Evaluation Patient Details Name: [name] MRN: [id number] DOB: 08-25-1950 Today's Date: 08/11/2015    History of Present Illness 65 year old female with history of hypertension, hyperlipidemia, breast cancer, hypothyroidism, sacral pressure ulcer, paraplegia secondary to cervical surgery in 2006 resents with altered mental status, increased lethargy, possible UTI has a chronic indwelling Foley, speech change in ED, urine culture as an outpatient growing Escherichia coli   Clinical Impression   Pt admitted with the above diagnoses and presents with below problem list. Pt presents at baseline with ADLs, some deconditioning and decreased attention and awareness noted. Discussed PLOF vs current level of assist with ADLs with pt and daughter. Pt was total A for all mobility including rolling and total A for most ADLs though she could feed herself and do some grooming tasks. Pt's daughter expressed concern that pt's spouse is not able to use the manual [place] lift at home and has been transferring the pt himself. It appears pt and spouse would greatly benefit from an Museum/gallery conservator lift to increase safety for pt and cg. Also, daughter expressed need for an air mattress at home that would be more appropriate to address wound care needs. Pt and family would benefit from social work/case management consult. As pt is baseline with ADLs acute OT signing off at this time. Thank you for the referral.     Follow Up Recommendations  Supervision/Assistance - 24 hour;Other (comment) (continue with previous [place] therapy)    Equipment Recommendations  Other (comment) see CI note above   Recommendations for Other Services       Precautions / Restrictions Precautions Precautions: Fall      Mobility Bed Mobility                  Transfers                      Balance                                            ADL Overall ADL's : At baseline                                        General ADL Comments: Pt completed oral care during session. Discussed PLOF and current needs in depth with daughter and pt.      Vision     Perception     Praxis      Pertinent Vitals/Pain Pain Assessment: Faces Faces Pain Scale: Hurts even more Pain Location: abdominal pain Pain Descriptors / Indicators: Aching;Sore Pain Intervention(s): Limited activity within patient's tolerance;Monitored during session;Repositioned     Hand Dominance Right   Extremity/Trunk Assessment Upper Extremity Assessment Upper Extremity Assessment: Generalized weakness   Lower Extremity Assessment Lower Extremity Assessment:  (paraplegia)       Communication Communication Communication: No difficulties   Cognition Arousal/Alertness: Awake/alert Behavior During Therapy: WFL for tasks assessed/performed Overall Cognitive Status: Impaired/Different from baseline Area of Impairment: Attention;Safety/judgement;Awareness;Problem solving   Current Attention Level: Sustained       Awareness: Emergent Problem Solving: Slow processing General Comments: Pt stating that she thinks some of her teeth are broken "I wonder if I poisoned myself." When asked, pt stated that her teeth do not hurt. Daughter present during session and  reports cognition is closer to baseline today.    General Comments       Exercises       Shoulder Instructions      Home Living Family/patient expects to be discharged to:: Private residence Living Arrangements: Spouse/significant other Available Help at Discharge: Family;Available 24 hours/day Type of Home: House       Home Layout: One level               Home Equipment: Wheelchair - manual;Hospital bed;Transport chair   Additional Comments: pt and spouse have manual hoyer lift which daughter reports is difficult for her primary cg (spouse) to use so he transfers with squat pivot method.        Prior Functioning/Environment Level of Independence: Needs assistance  Gait / Transfers Assistance Needed: total (A) for all transfers/ no ambulation/ paraplegia. Pt reports she is total A for rolling. ADL's / Homemaking Assistance Needed: per previous note: pt does not toliet on commode uses depends. pt able to feed self and complete oral in supported seating with setup. Total A for hair care.         OT Diagnosis: Generalized weakness   OT Problem List: Decreased strength;Decreased activity tolerance;Decreased knowledge of precautions;Decreased cognition;Pain   OT Treatment/Interventions:      OT Goals(Current goals can be found in the care plan section) Acute Rehab OT Goals Patient Stated Goal: home  OT Frequency:     Barriers to D/C:            Co-evaluation              End of Session    Activity Tolerance: Patient tolerated treatment well Patient left: in bed;with call bell/phone within reach;with family/visitor present   Time: [id number] OT Time Calculation (min): 26 min Charges:  OT General Charges $OT Visit: 1 Procedure OT Evaluation $OT Eval Low Complexity: 1 Procedure G-Codes:    Hortencia Pilar 08-20-2015, 12:28 PM

## 2015-08-11 NOTE — Consult Note (Signed)
Urology Consult   Physician requesting consult: Reyne Dumas, MD   Reason for consult: Nephrolithiasis  History of Present Illness: [name] is a 65 y.o. with history of hypertension, hyperlipidemia, breast cancer, hypothyroidism, sacral pressure ulcer, incomplete paraplegia secondary to cervical surgery in 2006 with inability to walk due to strength and sensation after a left total knee replacement. The patient has been managed with an indwelling foley for 9 months to help heal a severe sacral decubitus ulcer. The patient has several week history of intermittent fevers and had a urine culture drawn inappropriately from the foley bag by home health nurse that grew mixed GU flora on 07/23/15 according to care everywhere. There is mention of E. Coli but I do not see any evidence of this. The patient started an empiric dose of Bactrim and had one dose Thursday night but worsened and was brought into the ED Friday morning. While in the hospital the patient was found to have AKI with creatinine of 2.4 from a baseline of 1 and was afebrile with a white count of 15 that came down quickly with antibiotics and hydration. The patient had a CT that shows a 5-6 mm left proximal ureteral stone. The patient has a UA that is nitrite negative with other findings consistent with long standing foley catheter. The patient has a history of stones that have passed spontaneously and she is managed by Dr. Estill Dooms at [facility]  Past Medical History  Diagnosis Date  . Hypertension   . Difficult intubation     Fastrach #4 LMA then # 7 ETT used in 2006 [facility], cervical laminectomy)  . Hypothyroidism   . Cervical neuropathy     PERIPHERAL NEUROPATHY , HAD CERVICAL FUSION  . PONV (postoperative nausea and vomiting)   . Hypercholesteremia   . Sacral decubitus ulcer 10/2014  . Cancer of right breast ([place]) 2011  . Kidney stones 1990's    "twice; passed in hospital on my own" (04/13/2012)  . Kidney stones 08/10/2015   . Arthritis     "all over" (04/13/2012)    Past Surgical History  Procedure Laterality Date  . [place] luc  ~ 2003    "benign tumor removed from up under gum" (04/13/2012)  . Posterior cervical laminectomy  2006  . Eye surgery  2004    STENTS TO BIL EYES  . Tonsillectomy and adenoidectomy  ~ 1954  . Abdominal hysterectomy  ~ 1976  . Dacrocystorhinostomy  ~ 2000    "put stents in my tear ducts; both eyes" (04/13/2012)  . Total knee arthroplasty with revision components  04/13/2012    Procedure: TOTAL KNEE ARTHROPLASTY WITH REVISION COMPONENTS;  Surgeon: Hessie Dibble, MD;  Location: [place];  Service: Orthopedics;  Laterality: Left;  . Incision and drainage of wound Left 11/03/2014    Procedure: IRRIGATION AND DEBRIDEMENT SACRAL ULCER;  Surgeon: Irene Limbo, MD;  Location: [place];  Service: Plastics;  Laterality: Left;  . I&d extremity Bilateral 11/03/2014    Procedure: IRRIGATION AND DEBRIDEMENT BILATERAL HEEL;  Surgeon: Irene Limbo, MD;  Location: [place];  Service: Plastics;  Laterality: Bilateral;  . Joint replacement    . Back surgery    . Breast biopsy Right 2011  . Breast lumpectomy Right 2011     Current Hospital Medications:  Home meds:    Medication List    ASK your doctor about these medications        acetaminophen 500 MG tablet  Commonly known as:  TYLENOL  Take 500 mg by mouth every 6 (six) hours as needed for mild pain or moderate pain.     ascorbic acid 500 MG tablet  Commonly known as:  VITAMIN C  Take 1 tablet (500 mg total) by mouth daily.     baclofen 10 MG tablet  Commonly known as:  LIORESAL  Take 30 mg by mouth 2 (two) times daily.     DULoxetine 60 MG capsule  Commonly known as:  CYMBALTA  Take 60 mg by mouth daily after supper.     folic acid 1 MG tablet  Commonly known as:  FOLVITE  Take 1 mg by mouth daily after supper.     furosemide 40 MG tablet  Commonly known as:  LASIX  Take 40 mg by mouth daily after supper.     gabapentin  800 MG tablet  Commonly known as:  NEURONTIN  Take 1,600 mg by mouth 2 (two) times daily.     levothyroxine 125 MCG tablet  Commonly known as:  SYNTHROID, LEVOTHROID  Take 1 tablet (125 mcg total) by mouth daily before breakfast.     methocarbamol 500 MG tablet  Commonly known as:  ROBAXIN  Take 1 tablet (500 mg total) by mouth every 6 (six) hours as needed.     metoprolol 100 MG tablet  Commonly known as:  LOPRESSOR  Take 100 mg by mouth daily after supper.     morphine 30 MG 12 hr tablet  Commonly known as:  MS CONTIN  Take 30 mg by mouth 3 (three) times daily. 10am, 12pm, 8pm     multivitamin with minerals Tabs tablet  Take 1 tablet by mouth daily.     NIFEdipine 30 MG 24 hr tablet  Commonly known as:  PROCARDIA-XL/ADALAT-CC/NIFEDICAL-XL  Take 30 mg by mouth daily after supper.     oxyCODONE-acetaminophen 10-325 MG tablet  Commonly known as:  PERCOCET  Take 1-2 tablets by mouth 3 (three) times daily. Take 1 tablet by mouth daily at 7am, 2 tablets at 10am and 4pm     pantoprazole 40 MG tablet  Commonly known as:  PROTONIX  Take 1 tablet (40 mg total) by mouth daily.     sulfamethoxazole-trimethoprim 800-160 MG tablet  Commonly known as:  BACTRIM DS,SEPTRA DS  Take 1 tablet by mouth 2 (two) times daily. 7 day course filled 08/09/15     zinc sulfate 220 (50 Zn) MG capsule  Take 1 capsule (220 mg total) by mouth daily.        Scheduled Meds: . [START ON 08/12/2015] alfuzosin  10 mg Oral Q breakfast  . cefTRIAXone (ROCEPHIN)  IV  1 g Intravenous Q24H  . DULoxetine  60 mg Oral QPC supper  . furosemide  40 mg Oral QPC supper  . gabapentin  1,600 mg Oral BID  . heparin  5,000 Units Subcutaneous Q8H  . levothyroxine  125 mcg Oral QAC breakfast  . metoprolol  100 mg Oral QPC supper  . morphine  30 mg Oral Q8H  . NIFEdipine  30 mg Oral Daily  . oxyCODONE-acetaminophen  1 tablet Oral Daily   And  . oxyCODONE  5 mg Oral Daily   And  . oxyCODONE  10 mg Oral BID   And  .  oxyCODONE-acetaminophen  2 tablet Oral BID  . pantoprazole  40 mg Oral Daily  . polyethylene glycol  17 g Oral BID   Continuous Infusions: . sodium chloride 100 mL/hr at 08/11/15 0606   PRN Meds:.acetaminophen **  OR** acetaminophen, bisacodyl, morphine injection, ondansetron **OR** ondansetron (ZOFRAN) IV, senna-docusate  Allergies:  Allergies  Allergen Reactions  . Ivp Dye [Iodinated Diagnostic Agents] Other (See Comments)    "hot and sweaty and almost passed out"  . Aspirin Hives  . Sulfa Antibiotics Hives    Family History  Problem Relation Age of Onset  . Heart disease Father     Social History:  reports that she quit smoking about 9 months ago. Her smoking use included Cigarettes. She has a 25 pack-year smoking history. She has never used smokeless tobacco. She reports that she does not drink alcohol or use illicit drugs.  ROS: A complete review of systems was performed.  All systems are negative except for pertinent findings as noted.  Physical Exam:  Vital signs in last 24 hours: Temp:  [97.3 F (36.3 C)-97.9 F (36.6 C)] 97.8 F (36.6 C) (05/06 0530) Pulse Rate:  [78-101] 101 (05/06 0530) Resp:  [12-20] 18 (05/06 0530) BP: (90-132)/(47-75) 132/62 mmHg (05/06 0530) SpO2:  [99 %-100 %] 100 % (05/06 0530) Constitutional:  Alert and oriented, No acute distress Cardiovascular: Regular rate and rhythm, No JVD Respiratory: Normal respiratory effort, Lungs clear bilaterally GI: Abdomen is soft, diffusely tender and non-focal, nondistended, no abdominal masses GU: No CVA tenderness Lymphatic: No lymphadenopathy Neurologic: Sensation and muscle tone intact above the knees Psychiatric: Normal mood and affect  Laboratory Data:   Recent Labs  08/10/15 0706 08/10/15 0716 08/10/15 1601 08/11/15 0439  WBC 15.1*  --  10.9* 10.5  HGB 9.2* 10.5* 8.7* 7.5*  HCT 29.3* 31.0* 28.3* 24.4*  PLT 401*  --  377 384     Recent Labs  08/10/15 0706 08/10/15 0716  08/10/15 1601 08/11/15 0439  NA 137 140 142 142  K 4.4 4.3 4.6 4.5  CL 102 101 107 107  GLUCOSE 159* 157* 129* 109*  BUN 44* 45* 42* 40*  CALCIUM 8.7*  --  8.1* 8.3*  CREATININE 2.41* 2.40* 2.19* 1.89*     Results for orders placed or performed during the hospital encounter of 08/10/15 (from the past 24 hour(s))  CBC with Differential     Status: Abnormal   Collection Time: 08/10/15  4:01 PM  Result Value Ref Range   WBC 10.9 (H) 4.0 - 10.5 K/uL   RBC 2.96 (L) 3.87 - 5.11 MIL/uL   Hemoglobin 8.7 (L) 12.0 - 15.0 g/dL   HCT 28.3 (L) 36.0 - 46.0 %   MCV 95.6 78.0 - 100.0 fL   MCH 29.4 26.0 - 34.0 pg   MCHC 30.7 30.0 - 36.0 g/dL   RDW 16.7 (H) 11.5 - 15.5 %   Platelets 377 150 - 400 K/uL   Neutrophils Relative % 83 %   Neutro Abs 9.1 (H) 1.7 - 7.7 K/uL   Lymphocytes Relative 9 %   Lymphs Abs 1.0 0.7 - 4.0 K/uL   Monocytes Relative 8 %   Monocytes Absolute 0.9 0.1 - 1.0 K/uL   Eosinophils Relative 0 %   Eosinophils Absolute 0.0 0.0 - 0.7 K/uL   Basophils Relative 0 %   Basophils Absolute 0.0 0.0 - 0.1 K/uL  Comprehensive metabolic panel     Status: Abnormal   Collection Time: 08/10/15  4:01 PM  Result Value Ref Range   Sodium 142 135 - 145 mmol/L   Potassium 4.6 3.5 - 5.1 mmol/L   Chloride 107 101 - 111 mmol/L   CO2 21 (L) 22 - 32 mmol/L   Glucose, Bld 129 (  H) 65 - 99 mg/dL   BUN 42 (H) 6 - 20 mg/dL   Creatinine, Ser 2.19 (H) 0.44 - 1.00 mg/dL   Calcium 8.1 (L) 8.9 - 10.3 mg/dL   Total Protein 7.2 6.5 - 8.1 g/dL   Albumin 2.2 (L) 3.5 - 5.0 g/dL   AST 17 15 - 41 U/L   ALT 17 14 - 54 U/L   Alkaline Phosphatase 57 38 - 126 U/L   Total Bilirubin 0.8 0.3 - 1.2 mg/dL   GFR calc non Af Amer 22 (L) >60 mL/min   GFR calc Af Amer 26 (L) >60 mL/min   Anion gap 14 5 - 15  Lactic acid, plasma     Status: None   Collection Time: 08/10/15  4:01 PM  Result Value Ref Range   Lactic Acid, Venous 1.1 0.5 - 2.0 mmol/L  Procalcitonin     Status: None   Collection Time: 08/10/15   4:01 PM  Result Value Ref Range   Procalcitonin 1.54 ng/mL  Protime-INR     Status: Abnormal   Collection Time: 08/10/15  4:01 PM  Result Value Ref Range   Prothrombin Time 16.9 (H) 11.6 - 15.2 seconds   INR 1.36 0.00 - 1.49  APTT     Status: Abnormal   Collection Time: 08/10/15  4:01 PM  Result Value Ref Range   aPTT 40 (H) 24 - 37 seconds  Culture, blood (x 2)     Status: None (Preliminary result)   Collection Time: 08/10/15  4:10 PM  Result Value Ref Range   Specimen Description BLOOD RIGHT ANTECUBITAL    Special Requests IN PEDIATRIC BOTTLE 3CC    Culture PENDING    Report Status PENDING   Culture, blood (x 2)     Status: None (Preliminary result)   Collection Time: 08/10/15  4:25 PM  Result Value Ref Range   Specimen Description BLOOD LEFT ARM    Special Requests IN PEDIATRIC BOTTLE 5CC    Culture PENDING    Report Status PENDING   Comprehensive metabolic panel     Status: Abnormal   Collection Time: 08/11/15  4:39 AM  Result Value Ref Range   Sodium 142 135 - 145 mmol/L   Potassium 4.5 3.5 - 5.1 mmol/L   Chloride 107 101 - 111 mmol/L   CO2 21 (L) 22 - 32 mmol/L   Glucose, Bld 109 (H) 65 - 99 mg/dL   BUN 40 (H) 6 - 20 mg/dL   Creatinine, Ser 1.89 (H) 0.44 - 1.00 mg/dL   Calcium 8.3 (L) 8.9 - 10.3 mg/dL   Total Protein 7.3 6.5 - 8.1 g/dL   Albumin 2.2 (L) 3.5 - 5.0 g/dL   AST 14 (L) 15 - 41 U/L   ALT 16 14 - 54 U/L   Alkaline Phosphatase 56 38 - 126 U/L   Total Bilirubin 0.4 0.3 - 1.2 mg/dL   GFR calc non Af Amer 27 (L) >60 mL/min   GFR calc Af Amer 31 (L) >60 mL/min   Anion gap 14 5 - 15  CBC     Status: Abnormal   Collection Time: 08/11/15  4:39 AM  Result Value Ref Range   WBC 10.5 4.0 - 10.5 K/uL   RBC 2.58 (L) 3.87 - 5.11 MIL/uL   Hemoglobin 7.5 (L) 12.0 - 15.0 g/dL   HCT 24.4 (L) 36.0 - 46.0 %   MCV 94.6 78.0 - 100.0 fL   MCH 29.1 26.0 - 34.0 pg  MCHC 30.7 30.0 - 36.0 g/dL   RDW 16.8 (H) 11.5 - 15.5 %   Platelets 384 150 - 400 K/uL   Recent  Results (from the past 240 hour(s))  Urine culture     Status: Abnormal   Collection Time: 08/10/15  9:45 AM  Result Value Ref Range Status   Specimen Description URINE, CATHETERIZED  Final   Special Requests NONE  Final   Culture MULTIPLE SPECIES PRESENT, SUGGEST RECOLLECTION (A)  Final   Report Status 08/11/2015 FINAL  Final  Culture, blood (x 2)     Status: None (Preliminary result)   Collection Time: 08/10/15  4:10 PM  Result Value Ref Range Status   Specimen Description BLOOD RIGHT ANTECUBITAL  Final   Special Requests IN PEDIATRIC BOTTLE 3CC  Final   Culture PENDING  Incomplete   Report Status PENDING  Incomplete  Culture, blood (x 2)     Status: None (Preliminary result)   Collection Time: 08/10/15  4:25 PM  Result Value Ref Range Status   Specimen Description BLOOD LEFT ARM  Final   Special Requests IN PEDIATRIC BOTTLE 5CC  Final   Culture PENDING  Incomplete   Report Status PENDING  Incomplete    Renal Function:  Recent Labs  08/10/15 0706 08/10/15 0716 08/10/15 1601 08/11/15 0439  CREATININE 2.41* 2.40* 2.19* 1.89*   Estimated Creatinine Clearance: 28.8 mL/min (by C-G formula based on Cr of 1.89).  Radiologic Imaging: Ct Abdomen Pelvis Wo Contrast  08/10/2015  CLINICAL DATA:  Altered mental status. History of paralysis with chronic urinary tract infection. Bed sore. EXAM: CT ABDOMEN AND PELVIS WITHOUT CONTRAST TECHNIQUE: Multidetector CT imaging of the abdomen and pelvis was performed following the standard protocol without IV contrast. COMPARISON:  11/01/2014 FINDINGS: The lack of intravenous contrast limits the ability to evaluate solid abdominal organs. Lower chest: Evaluation of the lower thorax is degraded secondary to patient respiratory artifact. Minimal basilar dependent subpleural ground-glass atelectasis, right greater than left. There is minimal subsegmental axis within the left costophrenic angle. No focal airspace opacities. Cardiomegaly. Calcifications  within the aortic valve annulus. There is diffuse decreased attenuation intra cardiac blood pool suggestive of anemia. Hepatobiliary: Normal hepatic contour. There are layering gallstones within a distended but otherwise normal-appearing gallbladder. No definitive pericholecystic fluid on this noncontrast examination. No ascites. Pancreas: Normal noncontrast appearance of the pancreas Spleen: Punctate granuloma within otherwise normal-appearing spleen. Adrenals/Urinary Tract: There is a punctate (approximately 6 mm) stone within the superior aspect of the left ureter which results in mild upstream ureterectasis and pelvicaliectasis and asymmetric left-sided perinephric stranding. Multiple additional nonobstructing renal stones are seen bilaterally with 2 nonobstructing stones seen within the inferior pole the left kidney with dominant stone measuring 0.6 cm cholelithiasis without evidence image 95, series 6) and 3 nonobstructing stones within the inferior pole the right kidney with dominant stone measuring approximately 0.9 cm (axial image 44, series 3). No renal stones are seen along the expected course of the right ureter. The urinary bladder is decompressed with a Foley catheter. Normal noncontrast appearance of the bilateral adrenal glands. Stomach/Bowel: Large colonic stool burden without evidence of enteric obstruction. Remote noncontrast appearance of the terminal ileum and retrocecal appendix. No pneumoperitoneum, pneumatosis or portal venous gas. Vascular/Lymphatic: Scattered atherosclerotic plaque within a normal caliber abdominal aorta. Scattered retroperitoneal lymph nodes are numerous though individually not enlarged by size criteria with index left-sided periaortic lymph node measuring 0.8 cm in greatest short axis diameter (image 40, series 3). No bulky retroperitoneal,  mesenteric, pelvic or inguinal lymphadenopathy on this noncontrast examination. Reproductive: Normal noncontrast appearance of the  prostate. Several phleboliths are seen within lower pelvis bilaterally. No free fluid in the pelvic cul-de-sac. Other: There is a large pressure sore involving the left buttocks which extends to expose the left ischial tuberosity (image 94, series 3), progressed since the 10/2018 10/2014 examination. While exposure of the bone would indicate osteomyelitis, there is no definitive evidence of discrete osteolysis. Small mesenteric fat containing periumbilical hernia. Small right-sided inguinal hernia. Musculoskeletal: No acute or aggressive osseous abnormalities. Stigmata of DISH within the thoracic spine. Moderate severe bilateral lower lumbar spine facet degenerative change. Degenerative change of the bilateral hips. IMPRESSION: 1. Punctate (approximately 6 mm) stone within superior aspect of the left ureter results in mild upstream ureterectasis and pelvicaliectasis. 2. Additional bilateral nonobstructing nephrolithiasis as above. No evidence of right-sided urinary obstruction. 3. Cholelithiasis without evidence of acute cholecystitis on this noncontrast examination. 4. Large pressure sore involving the left buttocks which has progressed since the 10/2014 examination and now extends to expose the left ischial tuberosity. Exposure of the bone would indicate osteomyelitis, though there is no discrete evidence of osteolysis on this examination. Electronically Signed   By: [name] M.D.   On: 08/10/2015 11:42   Ct Head Wo Contrast  08/10/2015  CLINICAL DATA:  Altered mental status. EXAM: CT HEAD WITHOUT CONTRAST TECHNIQUE: Contiguous axial images were obtained from the base of the skull through the vertex without intravenous contrast. COMPARISON:  None. FINDINGS: Bony calvarium appears intact. Minimal chronic ischemic white matter disease is noted. No mass effect or midline shift is noted. Ventricular size is within normal limits. There is no evidence of mass lesion, hemorrhage or acute infarction. IMPRESSION:  Minimal chronic ischemic white matter disease. No acute intracranial abnormality seen. Electronically Signed   By: Marijo Conception, M.D.   On: 08/10/2015 08:27   Dg Chest Port 1 View  08/10/2015  CLINICAL DATA:  Suspected sepsis. EXAM: PORTABLE CHEST 1 VIEW COMPARISON:  04/15/2012 FINDINGS: Low lung volumes. Mild cardiomegaly. Linear densities at the lung bases likely reflect atelectasis. No confluent opacities otherwise. No effusions or acute bony abnormality. IMPRESSION: Mild cardiomegaly.  Low lung volumes with bibasilar atelectasis. Electronically Signed   By: Rolm Baptise M.D.   On: 08/10/2015 07:35    I independently reviewed the above imaging studies.  Impression/Recommendation: 65 y.o. female with complicated PMH with 5 mm left proximal ureteral stone. - [facility] with any fevers >101.4 - f/u urine cultures - continue antibiotics - confirm no other source - NPO after midnight for AM evaluation, possible ureteral stent Sunday - strain all urine - Alpha blocker started - Emergent stent possible with fevers  Christell Faith 08/11/2015, 2:04 PM

## 2015-08-11 NOTE — Progress Notes (Signed)
[facility] PROGRESS NOTE  [name] [id number] DOB: 08/23/1950 DOA: 08/10/2015   PCP: [name], MD     Assessment/Plan: Active Problems:   HTN (hypertension)   Breast cancer of lower-inner quadrant of right female breast ([place])   Sacral ulcer   Hypothyroidism   Paraplegia ([place])   Sacral decubitus ulcer   AKI (acute kidney injury) ([place])   Sepsis ([place])   Hypotension   Altered mental status   Leukocytosis     65 y.o. female with medical history significant for HTN, HDL, breast cancer not on therapy,hypothyroidism, sacral decubitus ulcer, paraplegia due to cervical surgery in 2006, under HHN, and recent UTI About 2 months ago, brought by her husband to the ED for evaluation of altered mental status. In review, symptoms began 4 days ago, when she developed low grade fevers up to 100s. At the time, her home health nurse took a urine sample of her indwelling catheter which revealed small amounts of E Coli, but no antibiotics were received at the time. Status was complicated 2 days ago with increased confusion and somnolence, difficult to arouse , decreased oral intake. Initiated on IV Vanco and Zosyn.  CT head negative. CXR is NAD.  Lactic Acid is 1. Troponin negative. Blood Gases pH 7.373, pCO2 45.6, pO2 92, Bicarb 26.5. Creat 2.4 (BL 0.9). WBC 15.1 UA positive for UTI, admitted for possible sepsis    Assessment and plan Presumed Sepsis in the setting of Possible complicated E Coli UTI/nephrolithiasis , pressure ulcer with progression, with exposed left ischial tuberosity, without evidence of osteomyelitis .She has acute mental status changes, hypoxia, hypotension. Lact Acid is 1. CX UA pending. Foley has been changed at the ED. T max 99 CBC remarkable for leukocytosis WBC 15.1 patient's ABG notable for pH 7.373, pCO2 45.6, pO2 92, Bicarb 26.5.  She is on 2l [place] Received 3l NS to date. Antibiotics with Vanco./ Zosyn delivered in the ED Sepsis order set   IV antibiotics ,now on  Rocephin , Follow lactic acid Follow blood culture, urine culture, continue IV fluids, procalcitonin 1.54, growing e coli in the outpt setting  Urology to consult ,discussed with Dr Mcdermid regarding CT findings     Acute encephalopathy due to above CT of the head without acute abnormality. No metabolic derangement.  Continue antibiotics and IVF  Await for pancultures  Hypotension: Likely due to dehydration in the setting of possible sepsis, improving after initiation of IVF, 3 liters to date. BP 106/58 mmHg  Pulse 80  Temp(Src) 99 F (37.2 C) (Rectal)  Resp 12  Wt 77.111 kg (170 lb)  SpO2 100% Continue hydration as above Hold BP meds until BP normalized  Leukocytosis, likely related to underlying infection. CXR NAD - abx as above - Repeat WBC in AM  Acute Kidney Injury likely due to Volume depletionBL Cr 0.9 Current Cr 2.48> now 1.89 Minimize nephrotoxins and avoid ACEI/ARB. Hold ACEI and Demadex until AKI failure resolves. Continue IV fluids  Depression Continue Cymbalta  Hyperlipidemia Continue home statins  Hypothyroidism: Chronic.  -Continue home Synthroid  Right breast Cancer, not on therapy To follow as outpatient  sacral decubitus ulcer due to decreased mobility due to paraplegia, with deconditioning. CT abdomen and pelvis pressure ulcer with progression, with exposed left ischial tuberosity, without evidence of ostelysis . Wound care Moist guaze packing Q day. Pt can resume Vac after discharge and follow up with the outpatient wound care center.  Consult PT/OT to help patient rotate  DVT prophylaxsis   Code Status:  Full code    Family Communication: Discussed in detail with the patient/husband, all imaging results, lab results explained to the patient   Disposition Plan: Anticipate discharge in 3-4 days, urology consult    Consultants:  None  Procedures:  None  Antibiotics: Rocephin  5/5- Vancomycin/Zosyn 1      HPI/Subjective: Patient complains of not having a BM in 10 days   Objective: Filed Vitals:   08/10/15 1430 08/10/15 1600 08/10/15 2131 08/11/15 0530  BP: 92/54 117/75 123/47 132/62  Pulse: 80 91 92 101  Temp:  97.3 F (36.3 C) 97.9 F (36.6 C) 97.8 F (36.6 C)  TempSrc:   Oral Oral  Resp: 12 20 20 18   Weight:      SpO2: 100% 99% 99% 100%    Intake/Output Summary (Last 24 hours) at 08/11/15 1044 Last data filed at 08/11/15 0640  Gross per 24 hour  Intake   3320 ml  Output   1495 ml  Net   1825 ml    Exam:  Examination:  General exam: Appears calm and comfortable  Respiratory system: Clear to auscultation. Respiratory effort normal. Cardiovascular system: S1 & S2 heard, RRR. No JVD, murmurs, rubs, gallops or clicks. No pedal edema. Gastrointestinal system: Abdomen is nondistended, soft and nontender. No organomegaly or masses felt. Normal bowel sounds heard. Central nervous system: Alert and oriented. No focal neurological deficits. Skin: She has a known deep tissue decubitus sacra ulcer, with gauze. No induration Skin: No rashes, lesions or ulcers Psychiatry: Judgement and insight appear normal. Mood & affect appropriate.     Data Reviewed: I have personally reviewed following labs and imaging studies  Micro Results Recent Results (from the past 240 hour(s))  Culture, blood (x 2)     Status: None (Preliminary result)   Collection Time: 08/10/15  4:10 PM  Result Value Ref Range Status   Specimen Description BLOOD RIGHT ANTECUBITAL  Final   Special Requests IN PEDIATRIC BOTTLE 3CC  Final   Culture PENDING  Incomplete   Report Status PENDING  Incomplete  Culture, blood (x 2)     Status: None (Preliminary result)   Collection Time: 08/10/15  4:25 PM  Result Value Ref Range Status   Specimen Description BLOOD LEFT ARM  Final   Special Requests IN PEDIATRIC BOTTLE 5CC  Final   Culture PENDING  Incomplete   Report Status PENDING   Incomplete    Radiology Reports Ct Abdomen Pelvis Wo Contrast  08/10/2015  CLINICAL DATA:  Altered mental status. History of paralysis with chronic urinary tract infection. Bed sore. EXAM: CT ABDOMEN AND PELVIS WITHOUT CONTRAST TECHNIQUE: Multidetector CT imaging of the abdomen and pelvis was performed following the standard protocol without IV contrast. COMPARISON:  11/01/2014 FINDINGS: The lack of intravenous contrast limits the ability to evaluate solid abdominal organs. Lower chest: Evaluation of the lower thorax is degraded secondary to patient respiratory artifact. Minimal basilar dependent subpleural ground-glass atelectasis, right greater than left. There is minimal subsegmental axis within the left costophrenic angle. No focal airspace opacities. Cardiomegaly. Calcifications within the aortic valve annulus. There is diffuse decreased attenuation intra cardiac blood pool suggestive of anemia. Hepatobiliary: Normal hepatic contour. There are layering gallstones within a distended but otherwise normal-appearing gallbladder. No definitive pericholecystic fluid on this noncontrast examination. No ascites. Pancreas: Normal noncontrast appearance of the pancreas Spleen: Punctate granuloma within otherwise normal-appearing spleen. Adrenals/Urinary Tract: There is a punctate (approximately 6 mm) stone within the superior aspect  of the left ureter which results in mild upstream ureterectasis and pelvicaliectasis and asymmetric left-sided perinephric stranding. Multiple additional nonobstructing renal stones are seen bilaterally with 2 nonobstructing stones seen within the inferior pole the left kidney with dominant stone measuring 0.6 cm cholelithiasis without evidence image 95, series 6) and 3 nonobstructing stones within the inferior pole the right kidney with dominant stone measuring approximately 0.9 cm (axial image 44, series 3). No renal stones are seen along the expected course of the right ureter. The  urinary bladder is decompressed with a Foley catheter. Normal noncontrast appearance of the bilateral adrenal glands. Stomach/Bowel: Large colonic stool burden without evidence of enteric obstruction. Remote noncontrast appearance of the terminal ileum and retrocecal appendix. No pneumoperitoneum, pneumatosis or portal venous gas. Vascular/Lymphatic: Scattered atherosclerotic plaque within a normal caliber abdominal aorta. Scattered retroperitoneal lymph nodes are numerous though individually not enlarged by size criteria with index left-sided periaortic lymph node measuring 0.8 cm in greatest short axis diameter (image 40, series 3). No bulky retroperitoneal, mesenteric, pelvic or inguinal lymphadenopathy on this noncontrast examination. Reproductive: Normal noncontrast appearance of the prostate. Several phleboliths are seen within lower pelvis bilaterally. No free fluid in the pelvic cul-de-sac. Other: There is a large pressure sore involving the left buttocks which extends to expose the left ischial tuberosity (image 94, series 3), progressed since the 10/2018 10/2014 examination. While exposure of the bone would indicate osteomyelitis, there is no definitive evidence of discrete osteolysis. Small mesenteric fat containing periumbilical hernia. Small right-sided inguinal hernia. Musculoskeletal: No acute or aggressive osseous abnormalities. Stigmata of DISH within the thoracic spine. Moderate severe bilateral lower lumbar spine facet degenerative change. Degenerative change of the bilateral hips. IMPRESSION: 1. Punctate (approximately 6 mm) stone within superior aspect of the left ureter results in mild upstream ureterectasis and pelvicaliectasis. 2. Additional bilateral nonobstructing nephrolithiasis as above. No evidence of right-sided urinary obstruction. 3. Cholelithiasis without evidence of acute cholecystitis on this noncontrast examination. 4. Large pressure sore involving the left buttocks which has  progressed since the 10/2014 examination and now extends to expose the left ischial tuberosity. Exposure of the bone would indicate osteomyelitis, though there is no discrete evidence of osteolysis on this examination. Electronically Signed   By: [name] M.D.   On: 08/10/2015 11:42   Ct Head Wo Contrast  08/10/2015  CLINICAL DATA:  Altered mental status. EXAM: CT HEAD WITHOUT CONTRAST TECHNIQUE: Contiguous axial images were obtained from the base of the skull through the vertex without intravenous contrast. COMPARISON:  None. FINDINGS: Bony calvarium appears intact. Minimal chronic ischemic white matter disease is noted. No mass effect or midline shift is noted. Ventricular size is within normal limits. There is no evidence of mass lesion, hemorrhage or acute infarction. IMPRESSION: Minimal chronic ischemic white matter disease. No acute intracranial abnormality seen. Electronically Signed   By: Marijo Conception, M.D.   On: 08/10/2015 08:27   Dg Chest Port 1 View  08/10/2015  CLINICAL DATA:  Suspected sepsis. EXAM: PORTABLE CHEST 1 VIEW COMPARISON:  04/15/2012 FINDINGS: Low lung volumes. Mild cardiomegaly. Linear densities at the lung bases likely reflect atelectasis. No confluent opacities otherwise. No effusions or acute bony abnormality. IMPRESSION: Mild cardiomegaly.  Low lung volumes with bibasilar atelectasis. Electronically Signed   By: Rolm Baptise M.D.   On: 08/10/2015 07:35     CBC  Recent Labs Lab 08/10/15 0706 08/10/15 0716 08/10/15 1601 08/11/15 0439  WBC 15.1*  --  10.9* 10.5  HGB 9.2* 10.5* 8.7*  7.5*  HCT 29.3* 31.0* 28.3* 24.4*  PLT 401*  --  377 384  MCV 93.9  --  95.6 94.6  MCH 29.5  --  29.4 29.1  MCHC 31.4  --  30.7 30.7  RDW 16.6*  --  16.7* 16.8*  LYMPHSABS 1.3  --  1.0  --   MONOABS 1.2*  --  0.9  --   EOSABS 0.1  --  0.0  --   BASOSABS 0.0  --  0.0  --     Chemistries   Recent Labs Lab 08/10/15 0706 08/10/15 0716 08/10/15 1601 08/11/15 0439  NA 137 140  142 142  K 4.4 4.3 4.6 4.5  CL 102 101 107 107  CO2 24  --  21* 21*  GLUCOSE 159* 157* 129* 109*  BUN 44* 45* 42* 40*  CREATININE 2.41* 2.40* 2.19* 1.89*  CALCIUM 8.7*  --  8.1* 8.3*  AST 21  --  17 14*  ALT 20  --  17 16  ALKPHOS 64  --  57 56  BILITOT 0.7  --  0.8 0.4   ------------------------------------------------------------------------------------------------------------------ estimated creatinine clearance is 28.8 mL/min (by C-G formula based on Cr of 1.89). ------------------------------------------------------------------------------------------------------------------ No results for input(s): HGBA1C in the last 72 hours. ------------------------------------------------------------------------------------------------------------------ No results for input(s): CHOL, HDL, LDLCALC, TRIG, CHOLHDL, LDLDIRECT in the last 72 hours. ------------------------------------------------------------------------------------------------------------------ No results for input(s): TSH, T4TOTAL, T3FREE, THYROIDAB in the last 72 hours.  Invalid input(s): FREET3 ------------------------------------------------------------------------------------------------------------------ No results for input(s): VITAMINB12, FOLATE, FERRITIN, TIBC, IRON, RETICCTPCT in the last 72 hours.  Coagulation profile  Recent Labs Lab 08/10/15 1601  INR 1.36    No results for input(s): DDIMER in the last 72 hours.  Cardiac Enzymes No results for input(s): CKMB, TROPONINI, MYOGLOBIN in the last 168 hours.  Invalid input(s): CK ------------------------------------------------------------------------------------------------------------------ Invalid input(s): POCBNP   CBG: No results for input(s): GLUCAP in the last 168 hours.     Studies: Ct Abdomen Pelvis Wo Contrast  08/10/2015  CLINICAL DATA:  Altered mental status. History of paralysis with chronic urinary tract infection. Bed sore. EXAM: CT ABDOMEN  AND PELVIS WITHOUT CONTRAST TECHNIQUE: Multidetector CT imaging of the abdomen and pelvis was performed following the standard protocol without IV contrast. COMPARISON:  11/01/2014 FINDINGS: The lack of intravenous contrast limits the ability to evaluate solid abdominal organs. Lower chest: Evaluation of the lower thorax is degraded secondary to patient respiratory artifact. Minimal basilar dependent subpleural ground-glass atelectasis, right greater than left. There is minimal subsegmental axis within the left costophrenic angle. No focal airspace opacities. Cardiomegaly. Calcifications within the aortic valve annulus. There is diffuse decreased attenuation intra cardiac blood pool suggestive of anemia. Hepatobiliary: Normal hepatic contour. There are layering gallstones within a distended but otherwise normal-appearing gallbladder. No definitive pericholecystic fluid on this noncontrast examination. No ascites. Pancreas: Normal noncontrast appearance of the pancreas Spleen: Punctate granuloma within otherwise normal-appearing spleen. Adrenals/Urinary Tract: There is a punctate (approximately 6 mm) stone within the superior aspect of the left ureter which results in mild upstream ureterectasis and pelvicaliectasis and asymmetric left-sided perinephric stranding. Multiple additional nonobstructing renal stones are seen bilaterally with 2 nonobstructing stones seen within the inferior pole the left kidney with dominant stone measuring 0.6 cm cholelithiasis without evidence image 95, series 6) and 3 nonobstructing stones within the inferior pole the right kidney with dominant stone measuring approximately 0.9 cm (axial image 44, series 3). No renal stones are seen along the expected course of the right ureter. The urinary bladder is  decompressed with a Foley catheter. Normal noncontrast appearance of the bilateral adrenal glands. Stomach/Bowel: Large colonic stool burden without evidence of enteric obstruction. Remote  noncontrast appearance of the terminal ileum and retrocecal appendix. No pneumoperitoneum, pneumatosis or portal venous gas. Vascular/Lymphatic: Scattered atherosclerotic plaque within a normal caliber abdominal aorta. Scattered retroperitoneal lymph nodes are numerous though individually not enlarged by size criteria with index left-sided periaortic lymph node measuring 0.8 cm in greatest short axis diameter (image 40, series 3). No bulky retroperitoneal, mesenteric, pelvic or inguinal lymphadenopathy on this noncontrast examination. Reproductive: Normal noncontrast appearance of the prostate. Several phleboliths are seen within lower pelvis bilaterally. No free fluid in the pelvic cul-de-sac. Other: There is a large pressure sore involving the left buttocks which extends to expose the left ischial tuberosity (image 94, series 3), progressed since the 10/2018 10/2014 examination. While exposure of the bone would indicate osteomyelitis, there is no definitive evidence of discrete osteolysis. Small mesenteric fat containing periumbilical hernia. Small right-sided inguinal hernia. Musculoskeletal: No acute or aggressive osseous abnormalities. Stigmata of DISH within the thoracic spine. Moderate severe bilateral lower lumbar spine facet degenerative change. Degenerative change of the bilateral hips. IMPRESSION: 1. Punctate (approximately 6 mm) stone within superior aspect of the left ureter results in mild upstream ureterectasis and pelvicaliectasis. 2. Additional bilateral nonobstructing nephrolithiasis as above. No evidence of right-sided urinary obstruction. 3. Cholelithiasis without evidence of acute cholecystitis on this noncontrast examination. 4. Large pressure sore involving the left buttocks which has progressed since the 10/2014 examination and now extends to expose the left ischial tuberosity. Exposure of the bone would indicate osteomyelitis, though there is no discrete evidence of osteolysis on this  examination. Electronically Signed   By: [name] M.D.   On: 08/10/2015 11:42   Ct Head Wo Contrast  08/10/2015  CLINICAL DATA:  Altered mental status. EXAM: CT HEAD WITHOUT CONTRAST TECHNIQUE: Contiguous axial images were obtained from the base of the skull through the vertex without intravenous contrast. COMPARISON:  None. FINDINGS: Bony calvarium appears intact. Minimal chronic ischemic white matter disease is noted. No mass effect or midline shift is noted. Ventricular size is within normal limits. There is no evidence of mass lesion, hemorrhage or acute infarction. IMPRESSION: Minimal chronic ischemic white matter disease. No acute intracranial abnormality seen. Electronically Signed   By: Marijo Conception, M.D.   On: 08/10/2015 08:27   Dg Chest Port 1 View  08/10/2015  CLINICAL DATA:  Suspected sepsis. EXAM: PORTABLE CHEST 1 VIEW COMPARISON:  04/15/2012 FINDINGS: Low lung volumes. Mild cardiomegaly. Linear densities at the lung bases likely reflect atelectasis. No confluent opacities otherwise. No effusions or acute bony abnormality. IMPRESSION: Mild cardiomegaly.  Low lung volumes with bibasilar atelectasis. Electronically Signed   By: Rolm Baptise M.D.   On: 08/10/2015 07:35      No results found for: HGBA1C Lab Results  Component Value Date   CREATININE 1.89* 08/11/2015       Scheduled Meds: . cefTRIAXone (ROCEPHIN)  IV  1 g Intravenous Q24H  . DULoxetine  60 mg Oral QPC supper  . furosemide  40 mg Oral QPC supper  . gabapentin  1,600 mg Oral BID  . heparin  5,000 Units Subcutaneous Q8H  . levothyroxine  125 mcg Oral QAC breakfast  . metoprolol  100 mg Oral QPC supper  . morphine  30 mg Oral Q8H  . NIFEdipine  30 mg Oral Daily  . oxyCODONE-acetaminophen  1 tablet Oral Daily   And  .  oxyCODONE  5 mg Oral Daily   And  . oxyCODONE  10 mg Oral BID   And  . oxyCODONE-acetaminophen  2 tablet Oral BID  . pantoprazole  40 mg Oral Daily   Continuous Infusions: . sodium chloride  100 mL/hr at 08/11/15 0606        Time spent: >30 MINS    [facility]  [facility] Pager [phone]. If 7PM-7AM, please contact night-coverage at [email or web address], password [email or web address] 08/11/2015, 10:44 AM

## 2015-08-12 LAB — CBC
HCT: 24.9 % — ABNORMAL LOW (ref 36.0–46.0)
Hemoglobin: 7.5 g/dL — ABNORMAL LOW (ref 12.0–15.0)
MCH: 28.5 pg (ref 26.0–34.0)
MCHC: 30.1 g/dL (ref 30.0–36.0)
MCV: 94.7 fL (ref 78.0–100.0)
PLATELETS: 437 10*3/uL — AB (ref 150–400)
RBC: 2.63 MIL/uL — ABNORMAL LOW (ref 3.87–5.11)
RDW: 16.7 % — AB (ref 11.5–15.5)
WBC: 6.4 10*3/uL (ref 4.0–10.5)

## 2015-08-12 LAB — COMPREHENSIVE METABOLIC PANEL
ALT: 17 U/L (ref 14–54)
ANION GAP: 12 (ref 5–15)
AST: 19 U/L (ref 15–41)
Albumin: 2.2 g/dL — ABNORMAL LOW (ref 3.5–5.0)
Alkaline Phosphatase: 56 U/L (ref 38–126)
BUN: 24 mg/dL — AB (ref 6–20)
CHLORIDE: 108 mmol/L (ref 101–111)
CO2: 21 mmol/L — AB (ref 22–32)
Calcium: 8.7 mg/dL — ABNORMAL LOW (ref 8.9–10.3)
Creatinine, Ser: 1.36 mg/dL — ABNORMAL HIGH (ref 0.44–1.00)
GFR calc Af Amer: 46 mL/min — ABNORMAL LOW (ref >60)
GFR calc non Af Amer: 40 mL/min — ABNORMAL LOW (ref >60)
GLUCOSE: 101 mg/dL — AB (ref 65–99)
POTASSIUM: 4.5 mmol/L (ref 3.5–5.1)
SODIUM: 141 mmol/L (ref 135–145)
Total Bilirubin: 0.4 mg/dL (ref 0.3–1.2)
Total Protein: 7.2 g/dL (ref 6.5–8.1)

## 2015-08-12 LAB — MRSA PCR SCREENING: MRSA by PCR: POSITIVE — AB

## 2015-08-12 LAB — OCCULT BLOOD X 1 CARD TO LAB, STOOL: Fecal Occult Bld: NEGATIVE

## 2015-08-12 MED ORDER — SENNOSIDES-DOCUSATE SODIUM 8.6-50 MG PO TABS
1.0000 | ORAL_TABLET | Freq: Two times a day (BID) | ORAL | Status: DC
  Administered 2015-08-12 – 2015-08-13 (×2): 1 via ORAL
  Filled 2015-08-12 (×2): qty 1

## 2015-08-12 MED ORDER — NITROFURANTOIN MONOHYD MACRO 100 MG PO CAPS
100.0000 mg | ORAL_CAPSULE | Freq: Two times a day (BID) | ORAL | Status: DC
  Administered 2015-08-12 – 2015-08-13 (×3): 100 mg via ORAL
  Filled 2015-08-12 (×3): qty 1

## 2015-08-12 MED ORDER — LACTULOSE 10 GM/15ML PO SOLN
20.0000 g | Freq: Two times a day (BID) | ORAL | Status: DC
Start: 2015-08-12 — End: 2015-08-13
  Administered 2015-08-12: 20 g via ORAL
  Filled 2015-08-12 (×2): qty 30

## 2015-08-12 NOTE — Progress Notes (Signed)
In regards to consult May 6th I performed a history and physical examination of the patient and discussed his management with the resident.  I reviewed the resident's note and agree with the documented findings and plan of care

## 2015-08-12 NOTE — Progress Notes (Signed)
PT Cancellation Note  Patient Details Name: [name] MRN: [id number] DOB: Aug 14, 1950   Cancelled Treatment:    Reason Eval/Treat Not Completed: Other (comment) (Pt is at baseline with [name] transfer).  Will assess if further needs are identified but pt is dependent for all mobility.   Ramond Dial 08/12/2015, 9:23 AM   Mee Hives, PT MS Acute Rehab Dept. Number: [phone] [id number] and [place] [phone]

## 2015-08-12 NOTE — Progress Notes (Signed)
Diffuse abdominal pain with mildly distended abdomen Cr 1.89 (was 2.4) baseline 1 All cultures normal  Will continue to follow Allow patient to eat today

## 2015-08-12 NOTE — Progress Notes (Addendum)
[facility] PROGRESS NOTE  Sahvannah Rieser HUT:[id number] DOB: 1950-10-07 DOA: 08/10/2015   PCP: [name], MD     Assessment/Plan: Active Problems:   HTN (hypertension)   Breast cancer of lower-inner quadrant of right female breast ([place])   Sacral ulcer   Hypothyroidism   Paraplegia ([place])   Sacral decubitus ulcer   AKI (acute kidney injury) ([place])   Sepsis ([place])   Hypotension   Altered mental status   Leukocytosis     65 y.o. ? HTN,  HDL,  PT1 c PA N0, stage IA invasive ductal CA, grade 1, ER 86%, PR 41%, KI-67 7%, HER-2  breast cancer not on therapy-status post inner quadrant lumpectomy + XRT 2011-2012 Prior Rx anastrozole 05/2010-05/2015-completed tamoxifen hypothyroidism,  sacral decubitus ulcer status post debridement pressure sores of ischial wound, left heel wound, right ankle wound Dr. Leland Johns 11/03/2014 +paraplegia due to cervical surgery in 2006,  under HHN,   UTI About 2 months ago, brought by her husband to the ED for evaluation of altered mental status.  In review, symptoms began 4 days ago, when she developed low grade fevers up to 100s. At the time, her home health nurse took a urine sample of her indwelling catheter which revealed small amounts of E Coli, but no antibiotics were received at the time.  Status was complicated 2 days ago with increased confusion and somnolence, difficult to arouse , decreased oral intake. Initiated on IV Vanco and Zosyn.  CT head negative. CXR is NAD.  Lactic Acid is 1. Troponin negative. Blood Gases pH 7.373, pCO2 45.6, pO2 92, Bicarb 26.5. Creat 2.4 (BL 0.9). WBC 15.1 UA positive for UTI, admitted for possible sepsis On admission 6 mm stones. Noted on CT scan + pressure sore extending to expose the left ischial tuberosity-? Osteomyelitis    Assessment and plan Sepsis 2/2 complicated E Coli UTI/nephrolithiasis ,  Resolved metabolic encephalopathy likely secondary to pyelonephritis -Unlikely source of infection is  osteomyelitis initially had hypoxia, hypotension.  Lact Acid is 1. CX UA pending. Foley has been changed at the ED. T max 99 CBC remarkable for leukocytosis WBC 15.1 --->6.4 08/12/2015 ABG notable for pH 7.373, pCO2 45.6, pO2 92, Bicarb 26.5.  She is on 2l [place] Received 3l NS to date.  Antibiotics with Vanco./ Zosyn delivered in the ED-->now on  Rocephin , Follow lactic acid  blood culture, urine culture multiple organisms,  Taper  IV fluid 100-->50 cc/HR, procalcitonin 1.54, growing e coli in the outpt setting  Urology  Dr Arther Dames input appreciated-transitioning today 08/12/14 to [facility] DS given local C/S would cover approximately 90% organisms. Patient was on saline as well as Lasix in a presumed attempt to hydrate aggressively but also to help pass stone. I have held off of Lasix 08/12/15 Note that patient is on your Urotraxal may benefit from addition of Flomax. Monitor for fever overnight  ?dilational anemia  Hemoglobin has remained in the 7.5 range 9 ranging 9-10 in the outpatient setting MCV is 94 so this is normocytic/borderline microcytic with an RDW that is increased Hemoccult stool regardless Suspect portion of this is dilutional given fluid resuscitation for sepsis Recheck in a.m. Transfusion threshold below 7.  Acute encephalopathy due to above CT of the head without acute abnormality. No metabolic derangement.  Continue antibiotics and IVF   Hypotension: 2/2  sepsis, improving after initiation of IVF, 3 liters to date.  Continue hydration as above It appears blood pressure meds were restarted Continue metoprolol 100 daily at bedtime, nifedipine 30  daily.  Leukocytosis, likely related to underlying infection. CXR NAD - abx as above - Repeat WBC in AM  Acute Kidney Injury likely due to Volume depletionBL Cr 0.9 Current Cr 2.48> now 1.89 Minimize nephrotoxins and avoid ACEI/ARB.  Hold ACEI and Demadex until AKI failure resolves. It appears patient is  on Lasix as well as IV fluid? Hold Lasix for now 08/12/2015 Continue IV fluids as above discussion  Depression Continue Cymbalta 60 mg daily at bedtime  Hyperlipidemia Continue home statins  Hypothyroidism: Chronic.  -Continue home Synthroid  Right breast Cancer, not on therapy To follow as outpatient with Dr. [name]  Opioid-induced constipation -Currently on MS Contin 30 every 8, when necessary IV morphine 1 mg every 4 as well as Percocet -We will discontinue Colace, discontinue MiraLAX and start lactulose 20 g twice a day in addition to senna increased dose 1 tablet twice a day -If no effect 08/12/2015, nurse to manually disimpact the patient 08/13/2015  sacral decubitus ulcer due to decreased mobility due to paraplegia, with deconditioning. CT abdomen and pelvis pressure ulcer with progression, with exposed left ischial tuberosity, without evidence of ostelysis . Wound care Moist guaze packing Q day. Pt can resume Vac after discharge and follow up with the outpatient wound care center.  Consult PT/OT to help patient rotate    DVT prophylaxsis  Code Status:  Full code Family Communication: Discussed in detail with the patient/husband Long discussion with daughter and husband at the bedside Disposition Plan: Anticipate discharge to home after PT consult in 3-4 days, urology consult    Consultants:  None  Procedures:  None  Antibiotics: Rocephin 5/5-08/12/15 Vancomycin/Zosyn 1 -Macrobid 5/7-->08/15/15?--Await passage of stone and urological input prior to final decision    HPI/Subjective:  No new issues, eating and drinking Still constipated Had a small golf ball size of stool but no other passage of stool No chest pain No fever No chills No vomiting No nausea Baseline does not ambulate  Objective: Filed Vitals:   08/11/15 1621 08/11/15 1748 08/11/15 2238 08/12/15 0631  BP: 116/72 140/73 125/61 147/66  Pulse: 102 108 92 101  Temp: 97.9 F (36.6 C)   97.9 F (36.6 C) 98.4 F (36.9 C)  TempSrc: Oral  Oral Oral  Resp: '20  17 17  '$ Weight:      SpO2: 98%  98% 96%    Intake/Output Summary (Last 24 hours) at 08/12/15 0754 Last data filed at 08/12/15 0600  Gross per 24 hour  Intake 2903.33 ml  Output   4000 ml  Net -1096.67 ml    Exam:  Examination:  General exam: Appears calm   Respiratory system: Clear to auscultation. Respiratory effort normal. Cardiovascular system: S1 & S2 heard, RRR. No JVD. Gastrointestinal system: Abdomen is nondistended, soft and nontender.  Central nervous system: Alert and oriented. Patient unable to left lower extremities however able to move upper extremities without issue Skin: She has a known deep tissue decubitus sacra ulcer, with gauze. No induration Skin: No rashes, lesions or ulcers Psychiatry: Judgement and insight appear normal. Mood & affect appropriate.     Data Reviewed: I have personally reviewed following labs and imaging studies  Micro Results Recent Results (from the past 240 hour(s))  Blood Culture (routine x 2)     Status: None (Preliminary result)   Collection Time: 08/10/15  7:45 AM  Result Value Ref Range Status   Specimen Description BLOOD LEFT HAND  Final   Special Requests BOTTLES DRAWN AEROBIC AND ANAEROBIC 5CC  Final   Culture NO GROWTH 1 DAY  Final   Report Status PENDING  Incomplete  Blood Culture (routine x 2)     Status: None (Preliminary result)   Collection Time: 08/10/15  7:50 AM  Result Value Ref Range Status   Specimen Description BLOOD RIGHT HAND  Final   Special Requests BOTTLES DRAWN AEROBIC AND ANAEROBIC 5CC  Final   Culture NO GROWTH 1 DAY  Final   Report Status PENDING  Incomplete  Urine culture     Status: Abnormal   Collection Time: 08/10/15  9:45 AM  Result Value Ref Range Status   Specimen Description URINE, CATHETERIZED  Final   Special Requests NONE  Final   Culture MULTIPLE SPECIES PRESENT, SUGGEST RECOLLECTION (A)  Final   Report Status  08/11/2015 FINAL  Final  Culture, blood (x 2)     Status: None (Preliminary result)   Collection Time: 08/10/15  4:10 PM  Result Value Ref Range Status   Specimen Description BLOOD RIGHT ANTECUBITAL  Final   Special Requests IN PEDIATRIC BOTTLE 3CC  Final   Culture NO GROWTH < 24 HOURS  Final   Report Status PENDING  Incomplete  Culture, blood (x 2)     Status: None (Preliminary result)   Collection Time: 08/10/15  4:25 PM  Result Value Ref Range Status   Specimen Description BLOOD LEFT ARM  Final   Special Requests IN PEDIATRIC BOTTLE 5CC  Final   Culture NO GROWTH < 24 HOURS  Final   Report Status PENDING  Incomplete  MRSA PCR Screening     Status: Abnormal   Collection Time: 08/12/15  4:11 AM  Result Value Ref Range Status   MRSA by PCR POSITIVE (A) NEGATIVE Final    Comment:        The GeneXpert MRSA Assay (FDA approved for NASAL specimens only), is one component of a comprehensive MRSA colonization surveillance program. It is not intended to diagnose MRSA infection nor to guide or monitor treatment for MRSA infections. RESULT CALLED TO, READ BACK BY AND VERIFIED WITH: SHOMO,K RN [id number] AT [id number] [facility]     Radiology Reports Ct Abdomen Pelvis Wo Contrast  08/10/2015  CLINICAL DATA:  Altered mental status. History of paralysis with chronic urinary tract infection. Bed sore. EXAM: CT ABDOMEN AND PELVIS WITHOUT CONTRAST TECHNIQUE: Multidetector CT imaging of the abdomen and pelvis was performed following the standard protocol without IV contrast. COMPARISON:  11/01/2014 FINDINGS: The lack of intravenous contrast limits the ability to evaluate solid abdominal organs. Lower chest: Evaluation of the lower thorax is degraded secondary to patient respiratory artifact. Minimal basilar dependent subpleural ground-glass atelectasis, right greater than left. There is minimal subsegmental axis within the left costophrenic angle. No focal airspace opacities. Cardiomegaly. Calcifications  within the aortic valve annulus. There is diffuse decreased attenuation intra cardiac blood pool suggestive of anemia. Hepatobiliary: Normal hepatic contour. There are layering gallstones within a distended but otherwise normal-appearing gallbladder. No definitive pericholecystic fluid on this noncontrast examination. No ascites. Pancreas: Normal noncontrast appearance of the pancreas Spleen: Punctate granuloma within otherwise normal-appearing spleen. Adrenals/Urinary Tract: There is a punctate (approximately 6 mm) stone within the superior aspect of the left ureter which results in mild upstream ureterectasis and pelvicaliectasis and asymmetric left-sided perinephric stranding. Multiple additional nonobstructing renal stones are seen bilaterally with 2 nonobstructing stones seen within the inferior pole the left kidney with dominant stone measuring 0.6 cm cholelithiasis without evidence image 95, series 6) and 3 nonobstructing stones within  the inferior pole the right kidney with dominant stone measuring approximately 0.9 cm (axial image 44, series 3). No renal stones are seen along the expected course of the right ureter. The urinary bladder is decompressed with a Foley catheter. Normal noncontrast appearance of the bilateral adrenal glands. Stomach/Bowel: Large colonic stool burden without evidence of enteric obstruction. Remote noncontrast appearance of the terminal ileum and retrocecal appendix. No pneumoperitoneum, pneumatosis or portal venous gas. Vascular/Lymphatic: Scattered atherosclerotic plaque within a normal caliber abdominal aorta. Scattered retroperitoneal lymph nodes are numerous though individually not enlarged by size criteria with index left-sided periaortic lymph node measuring 0.8 cm in greatest short axis diameter (image 40, series 3). No bulky retroperitoneal, mesenteric, pelvic or inguinal lymphadenopathy on this noncontrast examination. Reproductive: Normal noncontrast appearance of the  prostate. Several phleboliths are seen within lower pelvis bilaterally. No free fluid in the pelvic cul-de-sac. Other: There is a large pressure sore involving the left buttocks which extends to expose the left ischial tuberosity (image 94, series 3), progressed since the 10/2018 10/2014 examination. While exposure of the bone would indicate osteomyelitis, there is no definitive evidence of discrete osteolysis. Small mesenteric fat containing periumbilical hernia. Small right-sided inguinal hernia. Musculoskeletal: No acute or aggressive osseous abnormalities. Stigmata of DISH within the thoracic spine. Moderate severe bilateral lower lumbar spine facet degenerative change. Degenerative change of the bilateral hips. IMPRESSION: 1. Punctate (approximately 6 mm) stone within superior aspect of the left ureter results in mild upstream ureterectasis and pelvicaliectasis. 2. Additional bilateral nonobstructing nephrolithiasis as above. No evidence of right-sided urinary obstruction. 3. Cholelithiasis without evidence of acute cholecystitis on this noncontrast examination. 4. Large pressure sore involving the left buttocks which has progressed since the 10/2014 examination and now extends to expose the left ischial tuberosity. Exposure of the bone would indicate osteomyelitis, though there is no discrete evidence of osteolysis on this examination. Electronically Signed   By: [name] M.D.   On: 08/10/2015 11:42   Ct Head Wo Contrast  08/10/2015  CLINICAL DATA:  Altered mental status. EXAM: CT HEAD WITHOUT CONTRAST TECHNIQUE: Contiguous axial images were obtained from the base of the skull through the vertex without intravenous contrast. COMPARISON:  None. FINDINGS: Bony calvarium appears intact. Minimal chronic ischemic white matter disease is noted. No mass effect or midline shift is noted. Ventricular size is within normal limits. There is no evidence of mass lesion, hemorrhage or acute infarction. IMPRESSION:  Minimal chronic ischemic white matter disease. No acute intracranial abnormality seen. Electronically Signed   By: Marijo Conception, M.D.   On: 08/10/2015 08:27   Dg Chest Port 1 View  08/10/2015  CLINICAL DATA:  Suspected sepsis. EXAM: PORTABLE CHEST 1 VIEW COMPARISON:  04/15/2012 FINDINGS: Low lung volumes. Mild cardiomegaly. Linear densities at the lung bases likely reflect atelectasis. No confluent opacities otherwise. No effusions or acute bony abnormality. IMPRESSION: Mild cardiomegaly.  Low lung volumes with bibasilar atelectasis. Electronically Signed   By: Rolm Baptise M.D.   On: 08/10/2015 07:35     CBC  Recent Labs Lab 08/10/15 0706 08/10/15 0716 08/10/15 1601 08/11/15 0439 08/12/15 0648  WBC 15.1*  --  10.9* 10.5 6.4  HGB 9.2* 10.5* 8.7* 7.5* 7.5*  HCT 29.3* 31.0* 28.3* 24.4* 24.9*  PLT 401*  --  377 384 437*  MCV 93.9  --  95.6 94.6 94.7  MCH 29.5  --  29.4 29.1 28.5  MCHC 31.4  --  30.7 30.7 30.1  RDW 16.6*  --  16.7*  16.8* 16.7*  LYMPHSABS 1.3  --  1.0  --   --   MONOABS 1.2*  --  0.9  --   --   EOSABS 0.1  --  0.0  --   --   BASOSABS 0.0  --  0.0  --   --     Chemistries   Recent Labs Lab 08/10/15 0706 08/10/15 0716 08/10/15 1601 08/11/15 0439  NA 137 140 142 142  K 4.4 4.3 4.6 4.5  CL 102 101 107 107  CO2 24  --  21* 21*  GLUCOSE 159* 157* 129* 109*  BUN 44* 45* 42* 40*  CREATININE 2.41* 2.40* 2.19* 1.89*  CALCIUM 8.7*  --  8.1* 8.3*  AST 21  --  17 14*  ALT 20  --  17 16  ALKPHOS 64  --  57 56  BILITOT 0.7  --  0.8 0.4   ------------------------------------------------------------------------------------------------------------------ estimated creatinine clearance is 28.8 mL/min (by C-G formula based on Cr of 1.89). ------------------------------------------------------------------------------------------------------------------ No results for input(s): HGBA1C in the last 72  hours. ------------------------------------------------------------------------------------------------------------------ No results for input(s): CHOL, HDL, LDLCALC, TRIG, CHOLHDL, LDLDIRECT in the last 72 hours. ------------------------------------------------------------------------------------------------------------------ No results for input(s): TSH, T4TOTAL, T3FREE, THYROIDAB in the last 72 hours.  Invalid input(s): FREET3 ------------------------------------------------------------------------------------------------------------------ No results for input(s): VITAMINB12, FOLATE, FERRITIN, TIBC, IRON, RETICCTPCT in the last 72 hours.  Coagulation profile  Recent Labs Lab 08/10/15 1601  INR 1.36    No results for input(s): DDIMER in the last 72 hours.  Cardiac Enzymes No results for input(s): CKMB, TROPONINI, MYOGLOBIN in the last 168 hours.  Invalid input(s): CK ------------------------------------------------------------------------------------------------------------------ Invalid input(s): POCBNP   CBG: No results for input(s): GLUCAP in the last 168 hours.     Studies: Ct Abdomen Pelvis Wo Contrast  08/10/2015  CLINICAL DATA:  Altered mental status. History of paralysis with chronic urinary tract infection. Bed sore. EXAM: CT ABDOMEN AND PELVIS WITHOUT CONTRAST TECHNIQUE: Multidetector CT imaging of the abdomen and pelvis was performed following the standard protocol without IV contrast. COMPARISON:  11/01/2014 FINDINGS: The lack of intravenous contrast limits the ability to evaluate solid abdominal organs. Lower chest: Evaluation of the lower thorax is degraded secondary to patient respiratory artifact. Minimal basilar dependent subpleural ground-glass atelectasis, right greater than left. There is minimal subsegmental axis within the left costophrenic angle. No focal airspace opacities. Cardiomegaly. Calcifications within the aortic valve annulus. There is diffuse  decreased attenuation intra cardiac blood pool suggestive of anemia. Hepatobiliary: Normal hepatic contour. There are layering gallstones within a distended but otherwise normal-appearing gallbladder. No definitive pericholecystic fluid on this noncontrast examination. No ascites. Pancreas: Normal noncontrast appearance of the pancreas Spleen: Punctate granuloma within otherwise normal-appearing spleen. Adrenals/Urinary Tract: There is a punctate (approximately 6 mm) stone within the superior aspect of the left ureter which results in mild upstream ureterectasis and pelvicaliectasis and asymmetric left-sided perinephric stranding. Multiple additional nonobstructing renal stones are seen bilaterally with 2 nonobstructing stones seen within the inferior pole the left kidney with dominant stone measuring 0.6 cm cholelithiasis without evidence image 95, series 6) and 3 nonobstructing stones within the inferior pole the right kidney with dominant stone measuring approximately 0.9 cm (axial image 44, series 3). No renal stones are seen along the expected course of the right ureter. The urinary bladder is decompressed with a Foley catheter. Normal noncontrast appearance of the bilateral adrenal glands. Stomach/Bowel: Large colonic stool burden without evidence of enteric obstruction. Remote noncontrast appearance of the terminal ileum and retrocecal appendix.  No pneumoperitoneum, pneumatosis or portal venous gas. Vascular/Lymphatic: Scattered atherosclerotic plaque within a normal caliber abdominal aorta. Scattered retroperitoneal lymph nodes are numerous though individually not enlarged by size criteria with index left-sided periaortic lymph node measuring 0.8 cm in greatest short axis diameter (image 40, series 3). No bulky retroperitoneal, mesenteric, pelvic or inguinal lymphadenopathy on this noncontrast examination. Reproductive: Normal noncontrast appearance of the prostate. Several phleboliths are seen within lower  pelvis bilaterally. No free fluid in the pelvic cul-de-sac. Other: There is a large pressure sore involving the left buttocks which extends to expose the left ischial tuberosity (image 94, series 3), progressed since the 10/2018 10/2014 examination. While exposure of the bone would indicate osteomyelitis, there is no definitive evidence of discrete osteolysis. Small mesenteric fat containing periumbilical hernia. Small right-sided inguinal hernia. Musculoskeletal: No acute or aggressive osseous abnormalities. Stigmata of DISH within the thoracic spine. Moderate severe bilateral lower lumbar spine facet degenerative change. Degenerative change of the bilateral hips. IMPRESSION: 1. Punctate (approximately 6 mm) stone within superior aspect of the left ureter results in mild upstream ureterectasis and pelvicaliectasis. 2. Additional bilateral nonobstructing nephrolithiasis as above. No evidence of right-sided urinary obstruction. 3. Cholelithiasis without evidence of acute cholecystitis on this noncontrast examination. 4. Large pressure sore involving the left buttocks which has progressed since the 10/2014 examination and now extends to expose the left ischial tuberosity. Exposure of the bone would indicate osteomyelitis, though there is no discrete evidence of osteolysis on this examination. Electronically Signed   By: [name] M.D.   On: 08/10/2015 11:42   Ct Head Wo Contrast  08/10/2015  CLINICAL DATA:  Altered mental status. EXAM: CT HEAD WITHOUT CONTRAST TECHNIQUE: Contiguous axial images were obtained from the base of the skull through the vertex without intravenous contrast. COMPARISON:  None. FINDINGS: Bony calvarium appears intact. Minimal chronic ischemic white matter disease is noted. No mass effect or midline shift is noted. Ventricular size is within normal limits. There is no evidence of mass lesion, hemorrhage or acute infarction. IMPRESSION: Minimal chronic ischemic white matter disease. No acute  intracranial abnormality seen. Electronically Signed   By: Marijo Conception, M.D.   On: 08/10/2015 08:27      No results found for: HGBA1C Lab Results  Component Value Date   CREATININE 1.89* 08/11/2015       Scheduled Meds: . alfuzosin  10 mg Oral Q breakfast  . DULoxetine  60 mg Oral QPC supper  . furosemide  40 mg Oral QPC supper  . gabapentin  1,600 mg Oral BID  . heparin  5,000 Units Subcutaneous Q8H  . lactulose  20 g Oral BID  . levothyroxine  125 mcg Oral QAC breakfast  . metoprolol  100 mg Oral QPC supper  . morphine  30 mg Oral Q8H  . NIFEdipine  30 mg Oral Daily  . nitrofurantoin (macrocrystal-monohydrate)  100 mg Oral Q12H  . oxyCODONE-acetaminophen  1 tablet Oral Daily   And  . oxyCODONE  5 mg Oral Daily   And  . oxyCODONE  10 mg Oral BID   And  . oxyCODONE-acetaminophen  2 tablet Oral BID  . pantoprazole  40 mg Oral Daily  . senna-docusate  1 tablet Oral BID   Continuous Infusions: . sodium chloride 1,000 mL (08/12/15 0420)     LOS: 1 day    Time spent: >30 MINS    Verneita Griffes, MD [facility] (P) [phone]

## 2015-08-13 DIAGNOSIS — E039 Hypothyroidism, unspecified: Secondary | ICD-10-CM

## 2015-08-13 DIAGNOSIS — L89159 Pressure ulcer of sacral region, unspecified stage: Secondary | ICD-10-CM

## 2015-08-13 DIAGNOSIS — I10 Essential (primary) hypertension: Secondary | ICD-10-CM

## 2015-08-13 DIAGNOSIS — G8220 Paraplegia, unspecified: Secondary | ICD-10-CM

## 2015-08-13 DIAGNOSIS — R400 Somnolence: Secondary | ICD-10-CM

## 2015-08-13 DIAGNOSIS — N179 Acute kidney failure, unspecified: Secondary | ICD-10-CM

## 2015-08-13 MED ORDER — NITROFURANTOIN MONOHYD MACRO 100 MG PO CAPS: 100.0000 mg | ORAL_CAPSULE | Freq: Two times a day (BID) | ORAL | Status: DC

## 2015-08-13 MED ORDER — ALFUZOSIN HCL ER 10 MG PO TB24: 10.0000 mg | ORAL_TABLET | Freq: Every day | ORAL | Status: DC

## 2015-08-13 NOTE — Progress Notes (Signed)
Pt refused to take her lactulose and senekote, pt said she is pooping too much and dont need it anymore

## 2015-08-13 NOTE — Discharge Summary (Signed)
Physician Discharge Summary  [name] Cluster [id number] DOB: 27-Dec-1950 DOA: 08/10/2015  PCP: [name] Mariscal, MD  Admit date: 08/10/2015 Discharge date: 08/13/2015  Time spent: > 30 minutes  Recommendations for Outpatient Follow-up:  1. Follow up with Dr. Matilde [name] in 1 week   Discharge Diagnoses:  Active Problems:   HTN (hypertension)   Breast cancer of lower-inner quadrant of right female breast ([place])   Sacral ulcer   Hypothyroidism   Paraplegia ([facility])   Sacral decubitus ulcer   AKI (acute kidney injury) ([place])   Sepsis ([place])   Hypotension   Altered mental status   Leukocytosis  Discharge Condition: stable  Diet recommendation: regular  Filed Weights   08/10/15 0717 08/13/15 0634  Weight: 77.111 kg (170 lb) 88.225 kg (194 lb 8 oz)    History of present illness:  See H&P, Labs, Consult and Test reports for all details in brief, patient is a 65 y.o. female with medical history significant for HTN, HDL, breast cancer not on therapy,hypothyroidism, sacral decubitus ulcer, paraplegia due to cervical surgery in 2006, under HHN, and recent UTI About 2 months ago, brought by her husband to the ED for evaluation of altered mental status. In review, symptoms began 4 days ago, when she developed low grade fevers up to 100s. At the time, her home health nurse took a urine sample of her indwelling catheter which revealed small amounts of E Coli, but no antibiotics were received at the time. Status was complicated 2 days ago with increased confusion and somnolence, difficult to arouse , decreased oral intake.  Hospital Course:  Sepsis 2/2 complicated E Coli UTI/nephrolithiasis Resolved metabolic encephalopathy likely secondary to pyelonephritis - Unlikely source of infection is osteomyelitis - Urology was consulted and followed patient while hospitalized. Patient was initially maintained on broad-spectrum antibiotics initially, and she was narrowed to [facility] per urology, she remained stable  and will be discharged home to finish up course with nitrofurantoin. I discussed with Dr. Matilde [name], he will see her as an outpatient.  Dilational anemia - Hemoglobin has remained in the 7.5 range, stable, FOBT negative, repeat as an outpatient Acute encephalopathy due to sepsis - CT of the head without acute abnormality. No metabolic derangement. Resolved with antibiotics  Leukocytosis, likely related to underlying infection. CXR NAD, white count normalized Acute Kidney Injury likely due to Volume depletion, her creatinine is improving, 1.3 on discharge, will need repeat blood work as an outpatient on her next follow-up  Depression - Continue Cymbalta 60 mg daily at bedtime Hyperlipidemia - Continue home statins Hypothyroidism - Chronic. Continue home Synthroid Right breast Cancer, not on therapy, To follow as outpatient with Dr. Jana [name] Opioid-induced constipation - resolved Sacral decubitus ulcer due to decreased mobility due to paraplegia, with deconditioning. CT abdomen and pelvis pressure ulcer with progression, with exposed left ischial tuberosity, without evidence of ostelysis. Wound care Moist guaze packing Q day. Pt can resume Vac after discharge and follow up with the outpatient wound care center.    Procedures:  None    Consultations:  Urology   Discharge Exam: Filed Vitals:   08/12/15 1502 08/12/15 1724 08/12/15 2147 08/13/15 0634  BP: 147/67 158/86 156/72 163/67  Pulse: 102 100 94 99  Temp: 97.8 F (36.6 C)  98.2 F (36.8 C) 98.2 F (36.8 C)  TempSrc: Oral  Oral   Resp: 20  20   Weight:    88.225 kg (194 lb 8 oz)  SpO2: 98%  94% 99%    General: NAD Cardiovascular: RRR  Respiratory: CTA biL  Discharge Instructions Activity:  As tolerated   Get Medicines reviewed and adjusted: Please take all your medications with you for your next visit with your Primary MD  Please request your Primary MD to go over all hospital tests and procedure/radiological results  at the follow up, please ask your Primary MD to get all Hospital records sent to his/her office.  If you experience worsening of your admission symptoms, develop shortness of breath, life threatening emergency, suicidal or homicidal thoughts you must seek medical attention immediately by calling 911 or calling your MD immediately if symptoms less severe.  You must read complete instructions/literature along with all the possible adverse reactions/side effects for all the Medicines you take and that have been prescribed to you. Take any new Medicines after you have completely understood and accpet all the possible adverse reactions/side effects.   Do not drive when taking Pain medications.   Do not take more than prescribed Pain, Sleep and Anxiety Medications  Special Instructions: If you have smoked or chewed Tobacco in the last 2 yrs please stop smoking, stop any regular Alcohol and or any Recreational drug use.  Wear Seat belts while driving.  Please note  You were cared for by a hospitalist during your hospital stay. Once you are discharged, your primary care physician will handle any further medical issues. Please note that NO REFILLS for any discharge medications will be authorized once you are discharged, as it is imperative that you return to your primary care physician (or establish a relationship with a primary care physician if you do not have one) for your aftercare needs so that they can reassess your need for medications and monitor your lab values.    Medication List    STOP taking these medications        sulfamethoxazole-trimethoprim 800-160 MG tablet  Commonly known as:  BACTRIM DS,SEPTRA DS      TAKE these medications        acetaminophen 500 MG tablet  Commonly known as:  TYLENOL  Take 500 mg by mouth every 6 (six) hours as needed for mild pain or moderate pain.     alfuzosin 10 MG 24 hr tablet  Commonly known as:  UROXATRAL  Take 1 tablet (10 mg total) by  mouth daily with breakfast.     ascorbic acid 500 MG tablet  Commonly known as:  VITAMIN C  Take 1 tablet (500 mg total) by mouth daily.     baclofen 10 MG tablet  Commonly known as:  LIORESAL  Take 30 mg by mouth 2 (two) times daily.     DULoxetine 60 MG capsule  Commonly known as:  CYMBALTA  Take 60 mg by mouth daily after supper.     folic acid 1 MG tablet  Commonly known as:  FOLVITE  Take 1 mg by mouth daily after supper.     furosemide 40 MG tablet  Commonly known as:  LASIX  Take 40 mg by mouth daily after supper.     gabapentin 800 MG tablet  Commonly known as:  NEURONTIN  Take 1,600 mg by mouth 2 (two) times daily.     levothyroxine 125 MCG tablet  Commonly known as:  SYNTHROID, LEVOTHROID  Take 1 tablet (125 mcg total) by mouth daily before breakfast.     methocarbamol 500 MG tablet  Commonly known as:  ROBAXIN  Take 1 tablet (500 mg total) by mouth every 6 (six) hours as needed.  metoprolol 100 MG tablet  Commonly known as:  LOPRESSOR  Take 100 mg by mouth daily after supper.     morphine 30 MG 12 hr tablet  Commonly known as:  MS CONTIN  Take 30 mg by mouth 3 (three) times daily. 10am, 12pm, 8pm     multivitamin with minerals Tabs tablet  Take 1 tablet by mouth daily.     NIFEdipine 30 MG 24 hr tablet  Commonly known as:  PROCARDIA-XL/ADALAT-CC/NIFEDICAL-XL  Take 30 mg by mouth daily after supper.     nitrofurantoin (macrocrystal-monohydrate) 100 MG capsule  Commonly known as:  MACROBID  Take 1 capsule (100 mg total) by mouth every 12 (twelve) hours.     oxyCODONE-acetaminophen 10-325 MG tablet  Commonly known as:  PERCOCET  Take 1-2 tablets by mouth 3 (three) times daily. Take 1 tablet by mouth daily at 7am, 2 tablets at 10am and 4pm     pantoprazole 40 MG tablet  Commonly known as:  PROTONIX  Take 1 tablet (40 mg total) by mouth daily.     zinc sulfate 220 (50 Zn) MG capsule  Take 1 capsule (220 mg total) by mouth daily.             Follow-up Information    Follow up with MCDIARMID,TODD D, MD. Schedule an appointment as soon as possible for a visit in 1 week.   Specialty:  Family Medicine   Contact information:   [place] [address] [phone]       Follow up with [facility].   Why:  Home health services arranged, RN,PT and Nurse Aide   Contact information:   [address] [place] [address] [phone]       The results of significant diagnostics from this hospitalization (including imaging, microbiology, ancillary and laboratory) are listed below for reference.    Significant Diagnostic Studies: Ct Abdomen Pelvis Wo Contrast  08/10/2015  CLINICAL DATA:  Altered mental status. History of paralysis with chronic urinary tract infection. Bed sore. EXAM: CT ABDOMEN AND PELVIS WITHOUT CONTRAST TECHNIQUE: Multidetector CT imaging of the abdomen and pelvis was performed following the standard protocol without IV contrast. COMPARISON:  11/01/2014 FINDINGS: The lack of intravenous contrast limits the ability to evaluate solid abdominal organs. Lower chest: Evaluation of the lower thorax is degraded secondary to patient respiratory artifact. Minimal basilar dependent subpleural ground-glass atelectasis, right greater than left. There is minimal subsegmental axis within the left costophrenic angle. No focal airspace opacities. Cardiomegaly. Calcifications within the aortic valve annulus. There is diffuse decreased attenuation intra cardiac blood pool suggestive of anemia. Hepatobiliary: Normal hepatic contour. There are layering gallstones within a distended but otherwise normal-appearing gallbladder. No definitive pericholecystic fluid on this noncontrast examination. No ascites. Pancreas: Normal noncontrast appearance of the pancreas Spleen: Punctate granuloma within otherwise normal-appearing spleen. Adrenals/Urinary Tract: There is a punctate (approximately 6 mm) stone  within the superior aspect of the left ureter which results in mild upstream ureterectasis and pelvicaliectasis and asymmetric left-sided perinephric stranding. Multiple additional nonobstructing renal stones are seen bilaterally with 2 nonobstructing stones seen within the inferior pole the left kidney with dominant stone measuring 0.6 cm cholelithiasis without evidence image 95, series 6) and 3 nonobstructing stones within the inferior pole the right kidney with dominant stone measuring approximately 0.9 cm (axial image 44, series 3). No renal stones are seen along the expected course of the right ureter. The urinary bladder is decompressed with a Foley catheter. Normal  noncontrast appearance of the bilateral adrenal glands. Stomach/Bowel: Large colonic stool burden without evidence of enteric obstruction. Remote noncontrast appearance of the terminal ileum and retrocecal appendix. No pneumoperitoneum, pneumatosis or portal venous gas. Vascular/Lymphatic: Scattered atherosclerotic plaque within a normal caliber abdominal aorta. Scattered retroperitoneal lymph nodes are numerous though individually not enlarged by size criteria with index left-sided periaortic lymph node measuring 0.8 cm in greatest short axis diameter (image 40, series 3). No bulky retroperitoneal, mesenteric, pelvic or inguinal lymphadenopathy on this noncontrast examination. Reproductive: Normal noncontrast appearance of the prostate. Several phleboliths are seen within lower pelvis bilaterally. No free fluid in the pelvic cul-de-sac. Other: There is a large pressure sore involving the left buttocks which extends to expose the left ischial tuberosity (image 94, series 3), progressed since the 10/2018 10/2014 examination. While exposure of the bone would indicate osteomyelitis, there is no definitive evidence of discrete osteolysis. Small mesenteric fat containing periumbilical hernia. Small right-sided inguinal hernia. Musculoskeletal: No acute  or aggressive osseous abnormalities. Stigmata of DISH within the thoracic spine. Moderate severe bilateral lower lumbar spine facet degenerative change. Degenerative change of the bilateral hips. IMPRESSION: 1. Punctate (approximately 6 mm) stone within superior aspect of the left ureter results in mild upstream ureterectasis and pelvicaliectasis. 2. Additional bilateral nonobstructing nephrolithiasis as above. No evidence of right-sided urinary obstruction. 3. Cholelithiasis without evidence of acute cholecystitis on this noncontrast examination. 4. Large pressure sore involving the left buttocks which has progressed since the 10/2014 examination and now extends to expose the left ischial tuberosity. Exposure of the bone would indicate osteomyelitis, though there is no discrete evidence of osteolysis on this examination. Electronically Signed   By: [name] M.D.   On: 08/10/2015 11:42   Ct Head Wo Contrast  08/10/2015  CLINICAL DATA:  Altered mental status. EXAM: CT HEAD WITHOUT CONTRAST TECHNIQUE: Contiguous axial images were obtained from the base of the skull through the vertex without intravenous contrast. COMPARISON:  None. FINDINGS: Bony calvarium appears intact. Minimal chronic ischemic white matter disease is noted. No mass effect or midline shift is noted. Ventricular size is within normal limits. There is no evidence of mass lesion, hemorrhage or acute infarction. IMPRESSION: Minimal chronic ischemic white matter disease. No acute intracranial abnormality seen. Electronically Signed   By: Marijo Conception, M.D.   On: 08/10/2015 08:27   Dg Chest Port 1 View  08/10/2015  CLINICAL DATA:  Suspected sepsis. EXAM: PORTABLE CHEST 1 VIEW COMPARISON:  04/15/2012 FINDINGS: Low lung volumes. Mild cardiomegaly. Linear densities at the lung bases likely reflect atelectasis. No confluent opacities otherwise. No effusions or acute bony abnormality. IMPRESSION: Mild cardiomegaly.  Low lung volumes with bibasilar  atelectasis. Electronically Signed   By: Rolm Baptise M.D.   On: 08/10/2015 07:35    Microbiology: Recent Results (from the past 240 hour(s))  Blood Culture (routine x 2)     Status: None (Preliminary result)   Collection Time: 08/10/15  7:45 AM  Result Value Ref Range Status   Specimen Description BLOOD LEFT HAND  Final   Special Requests BOTTLES DRAWN AEROBIC AND ANAEROBIC 5CC  Final   Culture NO GROWTH 3 DAYS  Final   Report Status PENDING  Incomplete  Blood Culture (routine x 2)     Status: None (Preliminary result)   Collection Time: 08/10/15  7:50 AM  Result Value Ref Range Status   Specimen Description BLOOD RIGHT HAND  Final   Special Requests BOTTLES DRAWN AEROBIC AND ANAEROBIC 5CC  Final  Culture NO GROWTH 3 DAYS  Final   Report Status PENDING  Incomplete  Urine culture     Status: Abnormal   Collection Time: 08/10/15  9:45 AM  Result Value Ref Range Status   Specimen Description URINE, CATHETERIZED  Final   Special Requests NONE  Final   Culture MULTIPLE SPECIES PRESENT, SUGGEST RECOLLECTION (A)  Final   Report Status 08/11/2015 FINAL  Final  Culture, blood (x 2)     Status: None (Preliminary result)   Collection Time: 08/10/15  4:10 PM  Result Value Ref Range Status   Specimen Description BLOOD RIGHT ANTECUBITAL  Final   Special Requests IN PEDIATRIC BOTTLE 3CC  Final   Culture NO GROWTH 3 DAYS  Final   Report Status PENDING  Incomplete  Culture, blood (x 2)     Status: None (Preliminary result)   Collection Time: 08/10/15  4:25 PM  Result Value Ref Range Status   Specimen Description BLOOD LEFT ARM  Final   Special Requests IN PEDIATRIC BOTTLE 5CC  Final   Culture NO GROWTH 3 DAYS  Final   Report Status PENDING  Incomplete  MRSA PCR Screening     Status: Abnormal   Collection Time: 08/12/15  4:11 AM  Result Value Ref Range Status   MRSA by PCR POSITIVE (A) NEGATIVE Final    Comment:        The GeneXpert MRSA Assay (FDA approved for NASAL specimens only),  is one component of a comprehensive MRSA colonization surveillance program. It is not intended to diagnose MRSA infection nor to guide or monitor treatment for MRSA infections. RESULT CALLED TO, READ BACK BY AND VERIFIED WITH: SHOMO,K RN [id number] AT 0621 SKEEN,P    Labs: Basic Metabolic Panel:  Recent Labs Lab 08/10/15 0706 08/10/15 0716 08/10/15 1601 08/11/15 0439 08/12/15 0648  NA 137 140 142 142 141  K 4.4 4.3 4.6 4.5 4.5  CL 102 101 107 107 108  CO2 24  --  21* 21* 21*  GLUCOSE 159* 157* 129* 109* 101*  BUN 44* 45* 42* 40* 24*  CREATININE 2.41* 2.40* 2.19* 1.89* 1.36*  CALCIUM 8.7*  --  8.1* 8.3* 8.7*   Liver Function Tests:  Recent Labs Lab 08/10/15 0706 08/10/15 1601 08/11/15 0439 08/12/15 0648  AST 21 17 14* 19  ALT 20 17 16 17   ALKPHOS 64 57 56 56  BILITOT 0.7 0.8 0.4 0.4  PROT 7.8 7.2 7.3 7.2  ALBUMIN 2.5* 2.2* 2.2* 2.2*   CBC:  Recent Labs Lab 08/10/15 0706 08/10/15 0716 08/10/15 1601 08/11/15 0439 08/12/15 0648  WBC 15.1*  --  10.9* 10.5 6.4  NEUTROABS 12.5*  --  9.1*  --   --   HGB 9.2* 10.5* 8.7* 7.5* 7.5*  HCT 29.3* 31.0* 28.3* 24.4* 24.9*  MCV 93.9  --  95.6 94.6 94.7  PLT 401*  --  377 384 437*    Signed:  Shikha Bibb  [facility] 08/13/2015, 4:24 PM

## 2015-08-13 NOTE — Discharge Instructions (Signed)
Follow with [name], MD in 5-7 days  Please get a complete blood count and chemistry panel checked by your Primary MD at your next visit, and again as instructed by your Primary MD. Please get your medications reviewed and adjusted by your Primary MD.  Please request your Primary MD to go over all Hospital Tests and Procedure/Radiological results at the follow up, please get all Hospital records sent to your Prim MD by signing hospital release before you go home.  If you had Pneumonia of Lung problems at the Hospital: Please get a 2 view Chest X ray done in 6-8 weeks after hospital discharge or sooner if instructed by your Primary MD.  If you have Congestive Heart Failure: Please call your Cardiologist or Primary MD anytime you have any of the following symptoms:  1) 3 pound weight gain in 24 hours or 5 pounds in 1 week  2) shortness of breath, with or without a dry hacking cough  3) swelling in the hands, feet or stomach  4) if you have to sleep on extra pillows at night in order to breathe  Follow cardiac low salt diet and 1.5 lit/day fluid restriction.  If you have diabetes Accuchecks 4 times/day, Once in AM empty stomach and then before each meal. Log in all results and show them to your primary doctor at your next visit. If any glucose reading is under 80 or above 300 call your primary MD immediately.  If you have Seizure/Convulsions/Epilepsy: Please do not drive, operate heavy machinery, participate in activities at heights or participate in high speed sports until you have seen by Primary MD or a Neurologist and advised to do so again.  If you had Gastrointestinal Bleeding: Please ask your Primary MD to check a complete blood count within one week of discharge or at your next visit. Your endoscopic/colonoscopic biopsies that are pending at the time of discharge, will also need to followed by your Primary MD.  Get Medicines reviewed and adjusted. Please take all your medications  with you for your next visit with your Primary MD  Please request your Primary MD to go over all hospital tests and procedure/radiological results at the follow up, please ask your Primary MD to get all Hospital records sent to his/her office.  If you experience worsening of your admission symptoms, develop shortness of breath, life threatening emergency, suicidal or homicidal thoughts you must seek medical attention immediately by calling 911 or calling your MD immediately  if symptoms less severe.  You must read complete instructions/literature along with all the possible adverse reactions/side effects for all the Medicines you take and that have been prescribed to you. Take any new Medicines after you have completely understood and accpet all the possible adverse reactions/side effects.   Do not drive or operate heavy machinery when taking Pain medications.   Do not take more than prescribed Pain, Sleep and Anxiety Medications  Special Instructions: If you have smoked or chewed Tobacco  in the last 2 yrs please stop smoking, stop any regular Alcohol  and or any Recreational drug use.  Wear Seat belts while driving.  Please note You were cared for by a hospitalist during your hospital stay. If you have any questions about your discharge medications or the care you received while you were in the hospital after you are discharged, you can call the unit and asked to speak with the hospitalist on call if the hospitalist that took care of you is not available. Once you  are discharged, your primary care physician will handle any further medical issues. Please note that NO REFILLS for any discharge medications will be authorized once you are discharged, as it is imperative that you return to your primary care physician (or establish a relationship with a primary care physician if you do not have one) for your aftercare needs so that they can reassess your need for medications and monitor your lab  values.  You can reach the hospitalist office at phone [phone] or fax [phone]   If you do not have a primary care physician, you can call [phone] for a physician referral.  Activity: As tolerated with Full fall precautions use walker/cane & assistance as needed  Diet: regular  Disposition Home

## 2015-08-13 NOTE — Care Management Note (Addendum)
Case Management Note  Patient Details  Name: [name] MRN: [id number] Date of Birth: 01/31/1951  Subjective/Objective:                 Admitted with AKI.   Action/Plan: Discharge to home today with home health services( RN,PT and Nurse Aide).  Expected Discharge Date:   08/13/2015           Expected Discharge Plan:  [place]  In-House Referral:     Discharge planning Services  CM Consult  Post Acute Care Choice:  Resumption of Svcs/PTA Provider Choice offered to:  Patient  DME Arranged:    DME Agency:     HH Arranged:  RN, PT, Nurse's Aide [place] Agency:     Status of Service:  Completed, signed off  Medicare Important Message Given:    Date Medicare IM Given:    Medicare IM give by:    Date Additional Medicare IM Given:    Additional Medicare Important Message give by:     If discussed at [place] of Stay Meetings, dates discussed:    Additional Comments: Pt requesting new PCP, [facility] information given to pt per CM.  Whitman Hero [place], [place] [phone] 08/13/2015, 11:00 AM

## 2015-08-15 LAB — CULTURE, BLOOD (ROUTINE X 2)
CULTURE: NO GROWTH
CULTURE: NO GROWTH
Culture: NO GROWTH
Culture: NO GROWTH

## 2015-08-22 ENCOUNTER — Encounter (HOSPITAL_BASED_OUTPATIENT_CLINIC_OR_DEPARTMENT_OTHER): Payer: Medicare Other | Attending: Surgery

## 2015-08-22 DIAGNOSIS — G8221 Paraplegia, complete: Secondary | ICD-10-CM | POA: Diagnosis not present

## 2015-08-22 DIAGNOSIS — F0390 Unspecified dementia without behavioral disturbance: Secondary | ICD-10-CM | POA: Insufficient documentation

## 2015-08-22 DIAGNOSIS — G8220 Paraplegia, unspecified: Secondary | ICD-10-CM | POA: Insufficient documentation

## 2015-08-22 DIAGNOSIS — I10 Essential (primary) hypertension: Secondary | ICD-10-CM | POA: Diagnosis not present

## 2015-08-22 DIAGNOSIS — L89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, stage 4: Secondary | ICD-10-CM | POA: Insufficient documentation

## 2015-08-22 DIAGNOSIS — Z923 Personal history of irradiation: Secondary | ICD-10-CM | POA: Diagnosis not present

## 2015-08-22 DIAGNOSIS — Z79899 Other long term (current) drug therapy: Secondary | ICD-10-CM | POA: Insufficient documentation

## 2015-09-12 ENCOUNTER — Encounter (HOSPITAL_BASED_OUTPATIENT_CLINIC_OR_DEPARTMENT_OTHER): Payer: Medicare Other | Attending: Surgery

## 2015-09-12 DIAGNOSIS — I10 Essential (primary) hypertension: Secondary | ICD-10-CM | POA: Insufficient documentation

## 2015-09-12 DIAGNOSIS — Z853 Personal history of malignant neoplasm of breast: Secondary | ICD-10-CM | POA: Insufficient documentation

## 2015-09-12 DIAGNOSIS — Z79899 Other long term (current) drug therapy: Secondary | ICD-10-CM | POA: Diagnosis not present

## 2015-09-12 DIAGNOSIS — L89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, stage 4: Secondary | ICD-10-CM | POA: Diagnosis not present

## 2015-09-12 DIAGNOSIS — E039 Hypothyroidism, unspecified: Secondary | ICD-10-CM | POA: Insufficient documentation

## 2015-09-12 DIAGNOSIS — Z87891 Personal history of nicotine dependence: Secondary | ICD-10-CM | POA: Insufficient documentation

## 2015-09-12 DIAGNOSIS — G894 Chronic pain syndrome: Secondary | ICD-10-CM | POA: Diagnosis not present

## 2015-09-12 DIAGNOSIS — F0390 Unspecified dementia without behavioral disturbance: Secondary | ICD-10-CM | POA: Insufficient documentation

## 2015-09-12 DIAGNOSIS — Z923 Personal history of irradiation: Secondary | ICD-10-CM | POA: Diagnosis not present

## 2015-09-12 DIAGNOSIS — G8221 Paraplegia, complete: Secondary | ICD-10-CM | POA: Insufficient documentation

## 2015-09-13 ENCOUNTER — Telehealth: Payer: Self-pay | Admitting: *Deleted

## 2015-09-13 ENCOUNTER — Ambulatory Visit: Payer: Medicare Other | Admitting: Family Medicine

## 2015-09-13 NOTE — Telephone Encounter (Signed)
Our office was contacted by hospitalist requesting to schedule an appt with Dr. McDiarmid.  Appt was scheduled for today in error.  Patient is actually supposed to follow up with Dr. [name] (urologist) at [facility].  Informed patient of error and will contact [facility] for possible work-in appt since she has a foley cath.  Spoke with Mickel Baas (triage RN) at [facility]--she will call patient and work her in to see Dr. [name] this morning.  Burna Forts, BSN, RN-BC

## 2015-10-10 ENCOUNTER — Encounter (HOSPITAL_BASED_OUTPATIENT_CLINIC_OR_DEPARTMENT_OTHER): Payer: Medicare Other | Attending: Surgery

## 2015-10-10 DIAGNOSIS — I10 Essential (primary) hypertension: Secondary | ICD-10-CM | POA: Diagnosis not present

## 2015-10-10 DIAGNOSIS — L89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, stage 4: Secondary | ICD-10-CM | POA: Diagnosis not present

## 2015-10-10 DIAGNOSIS — Z923 Personal history of irradiation: Secondary | ICD-10-CM | POA: Diagnosis not present

## 2015-10-10 DIAGNOSIS — M1990 Unspecified osteoarthritis, unspecified site: Secondary | ICD-10-CM | POA: Diagnosis not present

## 2015-10-10 DIAGNOSIS — F0390 Unspecified dementia without behavioral disturbance: Secondary | ICD-10-CM | POA: Insufficient documentation

## 2015-10-10 DIAGNOSIS — G8221 Paraplegia, complete: Secondary | ICD-10-CM | POA: Diagnosis not present

## 2015-10-28 ENCOUNTER — Inpatient Hospital Stay (HOSPITAL_COMMUNITY): Payer: Medicare Other

## 2015-10-28 ENCOUNTER — Other Ambulatory Visit (HOSPITAL_COMMUNITY): Payer: Self-pay | Admitting: Emergency Medicine

## 2015-10-28 ENCOUNTER — Other Ambulatory Visit: Payer: Self-pay

## 2015-10-28 ENCOUNTER — Inpatient Hospital Stay (HOSPITAL_COMMUNITY)
Admission: EM | Admit: 2015-10-28 | Discharge: 2015-10-31 | DRG: 871 | Disposition: A | Discharge status: Home Health | Payer: Medicare Other | Attending: Internal Medicine | Admitting: Internal Medicine

## 2015-10-28 ENCOUNTER — Ambulatory Visit (HOSPITAL_COMMUNITY)
Admission: RE | Admit: 2015-10-28 | Discharge: 2015-10-28 | Disposition: A | Discharge status: Home / Self Care | Payer: Medicare Other | Source: Ambulatory Visit | Attending: Emergency Medicine | Admitting: Emergency Medicine

## 2015-10-28 ENCOUNTER — Encounter (HOSPITAL_COMMUNITY): Payer: Self-pay | Admitting: Internal Medicine

## 2015-10-28 DIAGNOSIS — Z993 Dependence on wheelchair: Secondary | ICD-10-CM | POA: Diagnosis not present

## 2015-10-28 DIAGNOSIS — R918 Other nonspecific abnormal finding of lung field: Secondary | ICD-10-CM

## 2015-10-28 DIAGNOSIS — G8220 Paraplegia, unspecified: Secondary | ICD-10-CM | POA: Diagnosis present

## 2015-10-28 DIAGNOSIS — E039 Hypothyroidism, unspecified: Secondary | ICD-10-CM | POA: Diagnosis present

## 2015-10-28 DIAGNOSIS — Z87891 Personal history of nicotine dependence: Secondary | ICD-10-CM

## 2015-10-28 DIAGNOSIS — N170 Acute kidney failure with tubular necrosis: Secondary | ICD-10-CM | POA: Diagnosis present

## 2015-10-28 DIAGNOSIS — N200 Calculus of kidney: Secondary | ICD-10-CM | POA: Diagnosis present

## 2015-10-28 DIAGNOSIS — Z853 Personal history of malignant neoplasm of breast: Secondary | ICD-10-CM

## 2015-10-28 DIAGNOSIS — D649 Anemia, unspecified: Secondary | ICD-10-CM | POA: Diagnosis present

## 2015-10-28 DIAGNOSIS — I10 Essential (primary) hypertension: Secondary | ICD-10-CM | POA: Diagnosis present

## 2015-10-28 DIAGNOSIS — I700 Atherosclerosis of aorta: Secondary | ICD-10-CM | POA: Diagnosis present

## 2015-10-28 DIAGNOSIS — M1990 Unspecified osteoarthritis, unspecified site: Secondary | ICD-10-CM | POA: Diagnosis not present

## 2015-10-28 DIAGNOSIS — B9620 Unspecified Escherichia coli [E. coli] as the cause of diseases classified elsewhere: Secondary | ICD-10-CM | POA: Diagnosis present

## 2015-10-28 DIAGNOSIS — Z981 Arthrodesis status: Secondary | ICD-10-CM

## 2015-10-28 DIAGNOSIS — Z96652 Presence of left artificial knee joint: Secondary | ICD-10-CM | POA: Diagnosis present

## 2015-10-28 DIAGNOSIS — I119 Hypertensive heart disease without heart failure: Secondary | ICD-10-CM | POA: Diagnosis present

## 2015-10-28 DIAGNOSIS — L98429 Non-pressure chronic ulcer of back with unspecified severity: Secondary | ICD-10-CM | POA: Diagnosis present

## 2015-10-28 DIAGNOSIS — G629 Polyneuropathy, unspecified: Secondary | ICD-10-CM | POA: Diagnosis present

## 2015-10-28 DIAGNOSIS — N10 Acute tubulo-interstitial nephritis: Secondary | ICD-10-CM | POA: Diagnosis present

## 2015-10-28 DIAGNOSIS — A419 Sepsis, unspecified organism: Secondary | ICD-10-CM | POA: Diagnosis present

## 2015-10-28 DIAGNOSIS — Z87442 Personal history of urinary calculi: Secondary | ICD-10-CM | POA: Diagnosis not present

## 2015-10-28 DIAGNOSIS — T83031A Leakage of indwelling urethral catheter, initial encounter: Secondary | ICD-10-CM | POA: Diagnosis present

## 2015-10-28 DIAGNOSIS — N179 Acute kidney failure, unspecified: Secondary | ICD-10-CM | POA: Diagnosis present

## 2015-10-28 DIAGNOSIS — M869 Osteomyelitis, unspecified: Secondary | ICD-10-CM | POA: Diagnosis present

## 2015-10-28 DIAGNOSIS — M86652 Other chronic osteomyelitis, left thigh: Secondary | ICD-10-CM | POA: Diagnosis present

## 2015-10-28 DIAGNOSIS — F0390 Unspecified dementia without behavioral disturbance: Secondary | ICD-10-CM | POA: Diagnosis not present

## 2015-10-28 DIAGNOSIS — L89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, stage 4: Secondary | ICD-10-CM | POA: Diagnosis present

## 2015-10-28 DIAGNOSIS — Z7401 Bed confinement status: Secondary | ICD-10-CM | POA: Diagnosis not present

## 2015-10-28 DIAGNOSIS — J9612 Chronic respiratory failure with hypercapnia: Secondary | ICD-10-CM

## 2015-10-28 DIAGNOSIS — R531 Weakness: Secondary | ICD-10-CM

## 2015-10-28 DIAGNOSIS — G8929 Other chronic pain: Secondary | ICD-10-CM | POA: Diagnosis present

## 2015-10-28 DIAGNOSIS — G9340 Encephalopathy, unspecified: Secondary | ICD-10-CM | POA: Diagnosis present

## 2015-10-28 DIAGNOSIS — Z923 Personal history of irradiation: Secondary | ICD-10-CM | POA: Diagnosis not present

## 2015-10-28 DIAGNOSIS — L89154 Pressure ulcer of sacral region, stage 4: Secondary | ICD-10-CM | POA: Diagnosis present

## 2015-10-28 DIAGNOSIS — Y738 Miscellaneous gastroenterology and urology devices associated with adverse incidents, not elsewhere classified: Secondary | ICD-10-CM | POA: Diagnosis present

## 2015-10-28 DIAGNOSIS — G8221 Paraplegia, complete: Secondary | ICD-10-CM | POA: Diagnosis not present

## 2015-10-28 DIAGNOSIS — N1330 Unspecified hydronephrosis: Secondary | ICD-10-CM | POA: Diagnosis present

## 2015-10-28 LAB — COMPREHENSIVE METABOLIC PANEL
ALT: 16 U/L (ref 14–54)
ALT: 16 U/L (ref 14–54)
ANION GAP: 7 (ref 5–15)
AST: 24 U/L (ref 15–41)
AST: 27 U/L (ref 15–41)
Albumin: 3.1 g/dL — ABNORMAL LOW (ref 3.5–5.0)
Albumin: 3 g/dL — ABNORMAL LOW (ref 3.5–5.0)
Alkaline Phosphatase: 92 U/L (ref 38–126)
Alkaline Phosphatase: 88 U/L (ref 38–126)
Anion gap: 7 (ref 5–15)
BUN: 17 mg/dL (ref 6–20)
BUN: 19 mg/dL (ref 6–20)
CHLORIDE: 106 mmol/L (ref 101–111)
CHLORIDE: 104 mmol/L (ref 101–111)
CO2: 26 mmol/L (ref 22–32)
CO2: 27 mmol/L (ref 22–32)
Calcium: 9.3 mg/dL (ref 8.9–10.3)
Calcium: 9 mg/dL (ref 8.9–10.3)
Creatinine, Ser: 1.06 mg/dL — ABNORMAL HIGH (ref 0.44–1.00)
Creatinine, Ser: 1.17 mg/dL — ABNORMAL HIGH (ref 0.44–1.00)
GFR, EST AFRICAN AMERICAN: 55 mL/min — AB (ref >60)
GFR, EST NON AFRICAN AMERICAN: 53 mL/min — AB (ref >60)
GFR, EST NON AFRICAN AMERICAN: 47 mL/min — AB (ref >60)
Glucose, Bld: 186 mg/dL — ABNORMAL HIGH (ref 65–99)
Glucose, Bld: 182 mg/dL — ABNORMAL HIGH (ref 65–99)
POTASSIUM: 4.3 mmol/L (ref 3.5–5.1)
POTASSIUM: 4.3 mmol/L (ref 3.5–5.1)
Sodium: 139 mmol/L (ref 135–145)
Sodium: 138 mmol/L (ref 135–145)
Total Bilirubin: 0.2 mg/dL — ABNORMAL LOW (ref 0.3–1.2)
Total Bilirubin: 0.5 mg/dL (ref 0.3–1.2)
Total Protein: 9 g/dL — ABNORMAL HIGH (ref 6.5–8.1)
Total Protein: 8.9 g/dL — ABNORMAL HIGH (ref 6.5–8.1)

## 2015-10-28 LAB — CBC WITH DIFFERENTIAL/PLATELET
BASOS ABS: 0 10*3/uL (ref 0.0–0.1)
BASOS ABS: 0 10*3/uL (ref 0.0–0.1)
BASOS PCT: 0 %
Basophils Relative: 0 %
EOS PCT: 0 %
EOS PCT: 0 %
Eosinophils Absolute: 0 10*3/uL (ref 0.0–0.7)
Eosinophils Absolute: 0 10*3/uL (ref 0.0–0.7)
HCT: 36.4 % (ref 36.0–46.0)
HCT: 33.1 % — ABNORMAL LOW (ref 36.0–46.0)
Hemoglobin: 10.6 g/dL — ABNORMAL LOW (ref 12.0–15.0)
Hemoglobin: 9.8 g/dL — ABNORMAL LOW (ref 12.0–15.0)
LYMPHS ABS: 1 10*3/uL (ref 0.7–4.0)
LYMPHS PCT: 6 %
Lymphocytes Relative: 5 %
Lymphs Abs: 1.2 10*3/uL (ref 0.7–4.0)
MCH: 26.5 pg (ref 26.0–34.0)
MCH: 26.9 pg (ref 26.0–34.0)
MCHC: 29.1 g/dL — ABNORMAL LOW (ref 30.0–36.0)
MCHC: 29.6 g/dL — ABNORMAL LOW (ref 30.0–36.0)
MCV: 91 fL (ref 78.0–100.0)
MCV: 90.9 fL (ref 78.0–100.0)
MONO ABS: 1 10*3/uL (ref 0.1–1.0)
MONO ABS: 1.7 10*3/uL — AB (ref 0.1–1.0)
Monocytes Relative: 6 %
Monocytes Relative: 7 %
NEUTROS ABS: 20.9 10*3/uL — AB (ref 1.7–7.7)
Neutro Abs: 14.8 10*3/uL — ABNORMAL HIGH (ref 1.7–7.7)
Neutrophils Relative %: 88 %
Neutrophils Relative %: 88 %
PLATELETS: 406 10*3/uL — AB (ref 150–400)
PLATELETS: 358 10*3/uL (ref 150–400)
RBC: 4 MIL/uL (ref 3.87–5.11)
RBC: 3.64 MIL/uL — ABNORMAL LOW (ref 3.87–5.11)
RDW: 19.9 % — AB (ref 11.5–15.5)
RDW: 19.9 % — AB (ref 11.5–15.5)
WBC: 16.8 10*3/uL — ABNORMAL HIGH (ref 4.0–10.5)
WBC: 23.8 10*3/uL — ABNORMAL HIGH (ref 4.0–10.5)

## 2015-10-28 LAB — I-STAT CG4 LACTIC ACID, ED
LACTIC ACID, VENOUS: 5.34 mmol/L — AB (ref 0.5–1.9)
Lactic Acid, Venous: 3.89 mmol/L (ref 0.5–1.9)

## 2015-10-28 LAB — APTT: aPTT: 30 seconds (ref 24–37)

## 2015-10-28 LAB — URINE MICROSCOPIC-ADD ON

## 2015-10-28 LAB — GLUCOSE, CAPILLARY
GLUCOSE-CAPILLARY: 139 mg/dL — AB (ref 65–99)
GLUCOSE-CAPILLARY: 127 mg/dL — AB (ref 65–99)

## 2015-10-28 LAB — I-STAT ARTERIAL BLOOD GAS, ED
Acid-base deficit: 1 mmol/L (ref 0.0–2.0)
Bicarbonate: 24.1 mEq/L — ABNORMAL HIGH (ref 20.0–24.0)
O2 SAT: 91 %
PCO2 ART: 38.4 mmHg (ref 35.0–45.0)
PH ART: 7.405 (ref 7.350–7.450)
PO2 ART: 60 mmHg — AB (ref 80.0–100.0)
TCO2: 25 mmol/L (ref 0–100)

## 2015-10-28 LAB — TSH: TSH: 1.19 u[IU]/mL (ref 0.350–4.500)

## 2015-10-28 LAB — URINALYSIS, ROUTINE W REFLEX MICROSCOPIC
Bilirubin Urine: NEGATIVE
Glucose, UA: NEGATIVE mg/dL
Ketones, ur: NEGATIVE mg/dL
NITRITE: NEGATIVE
Protein, ur: 30 mg/dL — AB
SPECIFIC GRAVITY, URINE: 1.013 (ref 1.005–1.030)
pH: 6.5 (ref 5.0–8.0)

## 2015-10-28 LAB — C-REACTIVE PROTEIN: CRP: 21.6 mg/dL — AB (ref <1.0)

## 2015-10-28 LAB — PROCALCITONIN: Procalcitonin: 1.38 ng/mL

## 2015-10-28 LAB — PROTIME-INR
INR: 1.27 (ref 0.00–1.49)
Prothrombin Time: 16.1 seconds — ABNORMAL HIGH (ref 11.6–15.2)

## 2015-10-28 LAB — MRSA PCR SCREENING: MRSA by PCR: POSITIVE — AB

## 2015-10-28 LAB — SEDIMENTATION RATE: SED RATE: 59 mm/h — AB (ref 0–22)

## 2015-10-28 LAB — LACTIC ACID, PLASMA: LACTIC ACID, VENOUS: 4.5 mmol/L — AB (ref 0.5–1.9)

## 2015-10-28 MED ORDER — CHLORHEXIDINE GLUCONATE CLOTH 2 % EX PADS
6.0000 | MEDICATED_PAD | Freq: Every day | CUTANEOUS | Status: DC
  Administered 2015-10-29 – 2015-10-31 (×3): 6 via TOPICAL

## 2015-10-28 MED ORDER — GABAPENTIN 800 MG PO TABS
800.0000 mg | ORAL_TABLET | Freq: Two times a day (BID) | ORAL | Status: DC
  Filled 2015-10-28: qty 1

## 2015-10-28 MED ORDER — SODIUM CHLORIDE 0.9 % IV SOLN
INTRAVENOUS | Status: AC
  Administered 2015-10-28: 15:00:00 via INTRAVENOUS
  Administered 2015-10-28: 1000 mL via INTRAVENOUS

## 2015-10-28 MED ORDER — ACETAMINOPHEN 325 MG PO TABS
650.0000 mg | ORAL_TABLET | Freq: Four times a day (QID) | ORAL | Status: DC | PRN
  Administered 2015-10-28: 650 mg via ORAL
  Filled 2015-10-28: qty 2

## 2015-10-28 MED ORDER — DULOXETINE HCL 60 MG PO CPEP
60.0000 mg | ORAL_CAPSULE | Freq: Every day | ORAL | Status: DC
  Administered 2015-10-29 – 2015-10-31 (×3): 60 mg via ORAL
  Filled 2015-10-28 (×3): qty 1

## 2015-10-28 MED ORDER — PANTOPRAZOLE SODIUM 40 MG PO TBEC
40.0000 mg | DELAYED_RELEASE_TABLET | Freq: Every day | ORAL | Status: DC
  Administered 2015-10-28 – 2015-10-30 (×3): 40 mg via ORAL
  Filled 2015-10-28 (×3): qty 1

## 2015-10-28 MED ORDER — VANCOMYCIN HCL IN DEXTROSE 750-5 MG/150ML-% IV SOLN
750.0000 mg | Freq: Two times a day (BID) | INTRAVENOUS | Status: DC
  Administered 2015-10-29 (×2): 750 mg via INTRAVENOUS
  Filled 2015-10-28 (×3): qty 150

## 2015-10-28 MED ORDER — SENNOSIDES-DOCUSATE SODIUM 8.6-50 MG PO TABS
1.0000 | ORAL_TABLET | Freq: Every evening | ORAL | Status: DC | PRN
  Filled 2015-10-28: qty 1

## 2015-10-28 MED ORDER — GABAPENTIN 400 MG PO CAPS
800.0000 mg | ORAL_CAPSULE | Freq: Two times a day (BID) | ORAL | Status: DC
  Administered 2015-10-28 – 2015-10-31 (×7): 800 mg via ORAL
  Filled 2015-10-28 (×7): qty 2

## 2015-10-28 MED ORDER — ACETAMINOPHEN 650 MG RE SUPP: 650.0000 mg | Freq: Four times a day (QID) | RECTAL | Status: DC | PRN

## 2015-10-28 MED ORDER — ENOXAPARIN SODIUM 40 MG/0.4ML ~~LOC~~ SOLN
40.0000 mg | SUBCUTANEOUS | Status: DC
  Administered 2015-10-28 – 2015-10-31 (×4): 40 mg via SUBCUTANEOUS
  Filled 2015-10-28 (×5): qty 0.4

## 2015-10-28 MED ORDER — BACLOFEN 20 MG PO TABS
30.0000 mg | ORAL_TABLET | Freq: Two times a day (BID) | ORAL | Status: DC
  Filled 2015-10-28: qty 1

## 2015-10-28 MED ORDER — INSULIN ASPART 100 UNIT/ML ~~LOC~~ SOLN
0.0000 [IU] | Freq: Every day | SUBCUTANEOUS | Status: DC
  Administered 2015-10-29: 0 [IU] via SUBCUTANEOUS

## 2015-10-28 MED ORDER — ALFUZOSIN HCL ER 10 MG PO TB24: 10.0000 mg | ORAL_TABLET | Freq: Every day | ORAL | Status: DC

## 2015-10-28 MED ORDER — DEXTROSE 5 % IV SOLN: 2.0000 g | Freq: Once | INTRAVENOUS | Status: DC

## 2015-10-28 MED ORDER — ONDANSETRON HCL 4 MG/2ML IJ SOLN
4.0000 mg | Freq: Four times a day (QID) | INTRAMUSCULAR | Status: DC | PRN
Start: 2015-10-28 — End: 2015-10-31
  Administered 2015-10-28: 4 mg via INTRAVENOUS
  Filled 2015-10-28: qty 2

## 2015-10-28 MED ORDER — BACLOFEN 20 MG PO TABS
20.0000 mg | ORAL_TABLET | Freq: Four times a day (QID) | ORAL | Status: DC
  Administered 2015-10-28 – 2015-10-31 (×12): 20 mg via ORAL
  Filled 2015-10-28 (×14): qty 1

## 2015-10-28 MED ORDER — OXYCODONE-ACETAMINOPHEN 7.5-325 MG PO TABS
1.0000 | ORAL_TABLET | Freq: Four times a day (QID) | ORAL | Status: DC | PRN
  Administered 2015-10-28 – 2015-10-30 (×2): 1 via ORAL
  Filled 2015-10-28 (×3): qty 1

## 2015-10-28 MED ORDER — ACETAMINOPHEN 325 MG PO TABS: 650.0000 mg | ORAL_TABLET | ORAL | Status: DC | PRN

## 2015-10-28 MED ORDER — VANCOMYCIN HCL IN DEXTROSE 1-5 GM/200ML-% IV SOLN
1000.0000 mg | INTRAVENOUS | Status: AC
  Administered 2015-10-28 (×2): 1000 mg via INTRAVENOUS
  Filled 2015-10-28: qty 200

## 2015-10-28 MED ORDER — CEFTRIAXONE SODIUM 1 G IJ SOLR
1.0000 g | Freq: Once | INTRAMUSCULAR | Status: AC
  Administered 2015-10-28: 1 g via INTRAVENOUS
  Filled 2015-10-28: qty 10

## 2015-10-28 MED ORDER — DEXTROSE 5 % IV SOLN
2.0000 g | INTRAVENOUS | Status: DC
  Administered 2015-10-28 – 2015-10-29 (×2): 2 g via INTRAVENOUS
  Filled 2015-10-28 (×2): qty 2

## 2015-10-28 MED ORDER — MUPIROCIN 2 % EX OINT
1.0000 | TOPICAL_OINTMENT | Freq: Two times a day (BID) | CUTANEOUS | Status: DC
Start: 2015-10-28 — End: 2015-10-31
  Administered 2015-10-28 – 2015-10-31 (×6): 1 via NASAL
  Filled 2015-10-28 (×4): qty 22

## 2015-10-28 MED ORDER — LEVOTHYROXINE SODIUM 25 MCG PO TABS
125.0000 ug | ORAL_TABLET | Freq: Every day | ORAL | Status: DC
  Administered 2015-10-28 – 2015-10-30 (×3): 125 ug via ORAL
  Filled 2015-10-28 (×4): qty 1

## 2015-10-28 MED ORDER — ACETAMINOPHEN 325 MG PO TABS
650.0000 mg | ORAL_TABLET | ORAL | Status: DC | PRN
  Administered 2015-10-28 – 2015-10-29 (×4): 650 mg via ORAL
  Filled 2015-10-28 (×4): qty 2

## 2015-10-28 MED ORDER — MORPHINE SULFATE ER 15 MG PO TBCR
15.0000 mg | EXTENDED_RELEASE_TABLET | Freq: Two times a day (BID) | ORAL | Status: DC
  Administered 2015-10-28 – 2015-10-31 (×7): 15 mg via ORAL
  Filled 2015-10-28 (×7): qty 1

## 2015-10-28 MED ORDER — SODIUM CHLORIDE 0.9 % IV BOLUS (SEPSIS)
1000.0000 mL | Freq: Once | INTRAVENOUS | Status: AC
  Administered 2015-10-28: 1000 mL via INTRAVENOUS

## 2015-10-28 MED ORDER — DEXTROSE 5 % IV SOLN: 1.0000 g | INTRAVENOUS | Status: DC

## 2015-10-28 MED ORDER — SODIUM CHLORIDE 0.9% FLUSH
3.0000 mL | Freq: Two times a day (BID) | INTRAVENOUS | Status: DC
  Administered 2015-10-28 – 2015-10-30 (×5): 3 mL via INTRAVENOUS

## 2015-10-28 MED ORDER — CEFTRIAXONE SODIUM 1 G IJ SOLR: 1.0000 g | Freq: Once | INTRAMUSCULAR | Status: DC

## 2015-10-28 MED ORDER — NIFEDIPINE 10 MG PO CAPS
10.0000 mg | ORAL_CAPSULE | Freq: Three times a day (TID) | ORAL | Status: DC
  Administered 2015-10-28 – 2015-10-31 (×10): 10 mg via ORAL
  Filled 2015-10-28 (×12): qty 1

## 2015-10-28 MED ORDER — FUROSEMIDE 40 MG PO TABS
40.0000 mg | ORAL_TABLET | Freq: Every day | ORAL | Status: DC
  Administered 2015-10-29 – 2015-10-31 (×3): 40 mg via ORAL
  Filled 2015-10-28 (×3): qty 1

## 2015-10-28 MED ORDER — ACETAMINOPHEN 650 MG RE SUPP: 650.0000 mg | RECTAL | Status: DC | PRN

## 2015-10-28 MED ORDER — ONDANSETRON HCL 4 MG PO TABS: 4.0000 mg | ORAL_TABLET | Freq: Four times a day (QID) | ORAL | Status: DC | PRN

## 2015-10-28 MED ORDER — METOPROLOL TARTRATE 25 MG PO TABS: 25.0000 mg | ORAL_TABLET | Freq: Two times a day (BID) | ORAL | Status: DC

## 2015-10-28 MED ORDER — INSULIN ASPART 100 UNIT/ML ~~LOC~~ SOLN
0.0000 [IU] | Freq: Three times a day (TID) | SUBCUTANEOUS | Status: DC
  Administered 2015-10-29: 3 [IU] via SUBCUTANEOUS
  Administered 2015-10-29 – 2015-10-31 (×5): 2 [IU] via SUBCUTANEOUS
  Administered 2015-10-31: 0 [IU] via SUBCUTANEOUS

## 2015-10-28 MED ORDER — METOPROLOL TARTRATE 50 MG PO TABS
50.0000 mg | ORAL_TABLET | Freq: Two times a day (BID) | ORAL | Status: DC
  Administered 2015-10-28 – 2015-10-31 (×7): 50 mg via ORAL
  Filled 2015-10-28 (×4): qty 1
  Filled 2015-10-28: qty 2
  Filled 2015-10-28 (×2): qty 1

## 2015-10-28 NOTE — ED Notes (Signed)
Pt to CT scan.

## 2015-10-28 NOTE — Progress Notes (Signed)
Patient more alert. Complaining of pain "all over". Requesting home pain meds.   She remains febrile. Improved mentation. Taking oral fluids. BP high end of normal. Tachycardic. Lactic acid trending down.  CT with pyelonephritis and osteomyelitis (likely chronic)   Plan:  Continue IV fluids Resume home pain meds at lower dose Resume BB with parameters Resume procardia with parameters Increase frequency of tylenol Cooling blanket. Monitor    [place] Stephanine Reas NP

## 2015-10-28 NOTE — ED Provider Notes (Signed)
[facility] DEPT Provider Note   CSN: [id number] Arrival date & time: 10/28/15  [id number]  First Provider Contact:  2:45AM   History   Chief Complaint No chief complaint on file.  The history is provided by the patient and a relative. No language interpreter was used.    LEVEL 5 CAVEAT: HPI and ROS limited due to AMS  HPI Comments: [name] is a 65 y.o. female who presents to the Emergency Department complaining of gradual onset, gradually worsening, constant inability to urinate onset approximately 8 hours prior to coming into the ED. She has an associated fever, per her family member. He reports that she has not had any urine output for 8 hours PTA, and has been significantly away from her baseline. Her family member reports that next month that she is supposed to have three kidney stones removed and a Suprapubic catheter placed. Family member reports that she has a wound on her buttock, and currently is on woundVAC, but is currently healing. He denies any type of drainage from the area. She is followed by a wound specialist for her care. She denies trouble breathing, coughing, or congestion.  Past Medical History:  Diagnosis Date  . Arthritis    "all over" (04/13/2012)  . Cancer of right breast ([place]) 2011  . Cervical neuropathy    PERIPHERAL NEUROPATHY , HAD CERVICAL FUSION  . Difficult intubation    Fastrach #4 LMA then # 7 ETT used in 2006 [facility], cervical laminectomy)  . Hypercholesteremia   . Hypertension   . Hypothyroidism   . Kidney stones 1990's   "twice; passed in hospital on my own" (04/13/2012)  . Kidney stones 08/10/2015  . PONV (postoperative nausea and vomiting)   . Sacral decubitus ulcer 10/2014    Patient Active Problem List   Diagnosis Date Noted  . AKI (acute kidney injury) ([place]) 08/10/2015  . Sepsis ([place]) 08/10/2015  . Hypotension 08/10/2015  . Altered mental status 08/10/2015  . Leukocytosis 08/10/2015  . Acute kidney injury ([place])   . Encephalopathy  acute   . Osteomyelitis ([place]) 11/06/2014  . Sacral decubitus ulcer   . Paraplegia ([place]) 11/01/2014  . Sacral ulcer 10/31/2014  . Hypothyroidism 10/31/2014  . Breast cancer of lower-inner quadrant of right female breast ([place]) 12/22/2012  . HTN (hypertension) 04/15/2012  . Left knee DJD 04/13/2012    Class: Chronic    Past Surgical History:  Procedure Laterality Date  . ABDOMINAL HYSTERECTOMY  ~ 1976  . BACK SURGERY    . BREAST BIOPSY Right 2011  . BREAST LUMPECTOMY Right 2011  . CALDWELL LUC  ~ 2003   "benign tumor removed from up under gum" (04/13/2012)  . DACROCYSTORHINOSTOMY  ~ 2000   "put stents in my tear ducts; both eyes" (04/13/2012)  . EYE SURGERY  2004   STENTS TO BIL EYES  . I&D EXTREMITY Bilateral 11/03/2014   Procedure: IRRIGATION AND DEBRIDEMENT BILATERAL HEEL;  Surgeon: Irene Limbo, MD;  Location: [place];  Service: Plastics;  Laterality: Bilateral;  . INCISION AND DRAINAGE OF WOUND Left 11/03/2014   Procedure: IRRIGATION AND DEBRIDEMENT SACRAL ULCER;  Surgeon: Irene Limbo, MD;  Location: [place];  Service: Plastics;  Laterality: Left;  . JOINT REPLACEMENT    . POSTERIOR CERVICAL LAMINECTOMY  2006  . TONSILLECTOMY AND ADENOIDECTOMY  ~ 1954  . TOTAL KNEE ARTHROPLASTY WITH REVISION COMPONENTS  04/13/2012   Procedure: TOTAL KNEE ARTHROPLASTY WITH REVISION COMPONENTS;  Surgeon: Hessie Dibble, MD;  Location: [place];  Service:  Orthopedics;  Laterality: Left;    OB History    No data available       Home Medications    Prior to Admission medications   Medication Sig Start Date End Date Taking? Authorizing Provider  acetaminophen (TYLENOL) 500 MG tablet Take 500 mg by mouth every 6 (six) hours as needed for mild pain or moderate pain.    Historical Provider, MD  alfuzosin (UROXATRAL) 10 MG 24 hr tablet Take 1 tablet (10 mg total) by mouth daily with breakfast. 08/13/15   Costin Karlyne [name], MD  ascorbic acid (VITAMIN C) 500 MG tablet Take 1 tablet (500 mg total) by  mouth daily. 11/07/14   Francesca Oman, DO  baclofen (LIORESAL) 10 MG tablet Take 30 mg by mouth 2 (two) times daily. 09/26/14   Historical Provider, MD  DULoxetine (CYMBALTA) 60 MG capsule Take 60 mg by mouth daily after supper.     Historical Provider, MD  ferrous sulfate 325 (65 FE) MG tablet Take 325 mg by mouth daily with breakfast.    Historical Provider, MD  folic acid (FOLVITE) 1 MG tablet Take 1 mg by mouth daily after supper.     Historical Provider, MD  furosemide (LASIX) 40 MG tablet Take 40 mg by mouth daily after supper.     Historical Provider, MD  gabapentin (NEURONTIN) 800 MG tablet Take 1,600 mg by mouth 2 (two) times daily. 09/22/14   Historical Provider, MD  levothyroxine (SYNTHROID, LEVOTHROID) 125 MCG tablet Take 1 tablet (125 mcg total) by mouth daily before breakfast. Patient taking differently: Take 125 mcg by mouth daily after supper.  11/07/14   Francesca Oman, DO  metoprolol (LOPRESSOR) 100 MG tablet Take 100 mg by mouth daily after supper. 10/28/14   Historical Provider, MD  morphine (MS CONTIN) 30 MG 12 hr tablet Take 30 mg by mouth 3 (three) times daily. 10am, 12pm, 8pm 08/06/15   Historical Provider, MD  Multiple Vitamin (MULTIVITAMIN WITH MINERALS) TABS tablet Take 1 tablet by mouth daily. 11/07/14   Francesca Oman, DO  NIFEdipine (PROCARDIA XL/ADALAT-CC) 30 MG 24 hr tablet Take 30 mg by mouth daily after supper.     Historical Provider, MD  oxyCODONE-acetaminophen (PERCOCET) 10-325 MG per tablet Take 1-2 tablets by mouth 3 (three) times daily. Take 1 tablet by mouth daily at 7am, 2 tablets at 10am and 4pm 10/10/14   Historical Provider, MD  pantoprazole (PROTONIX) 40 MG tablet Take 1 tablet (40 mg total) by mouth daily. Patient taking differently: Take 40 mg by mouth at bedtime.  11/07/14   Francesca Oman, DO  zinc sulfate 220 MG capsule Take 1 capsule (220 mg total) by mouth daily. 11/07/14   Francesca Oman, DO    Family History Family History  Problem Relation Age of Onset  .  Heart disease Father     Social History Social History  Substance Use Topics  . Smoking status: Former Smoker    Packs/day: 0.50    Years: 50.00    Types: Cigarettes    Quit date: 11/06/2014  . Smokeless tobacco: Never Used  . Alcohol use No    Allergies   Ivp dye [iodinated diagnostic agents]; Aspirin; and Sulfa antibiotics   Review of Systems Review of Systems  Constitutional: Positive for fever. Negative for chills.  HENT: Negative for congestion and rhinorrhea.   Eyes: Negative for redness and visual disturbance.  Respiratory: Negative for cough, shortness of breath and wheezing.   Cardiovascular: Negative for  chest pain and palpitations.  Gastrointestinal: Negative for nausea and vomiting.  Genitourinary: Positive for difficulty urinating. Negative for dysuria and urgency.  Musculoskeletal: Negative for arthralgias and myalgias.  Skin: Positive for wound. Negative for pallor.  Neurological: Negative for dizziness and headaches.   LEVEL 5 CAVEAT: HPI and ROS limited due to AMS  Physical Exam Updated Vital Signs P 118, 137/77 RR 20, T 100.7, O2 Sat 96%  Physical Exam  Constitutional: She appears well-developed and well-nourished.  HENT:  Head: Normocephalic and atraumatic.  Eyes: EOM are normal. Pupils are equal, round, and reactive to light.  Neck: Normal range of motion.  Cardiovascular: Regular rhythm and normal heart sounds.  Tachycardia present.   No murmur heard. Pulmonary/Chest: Effort normal and breath sounds normal. No respiratory distress. She has no wheezes. She has no rales.  Abdominal: There is no tenderness.  Pt has an indurated abdomen without palpable tenderness.  Musculoskeletal: She exhibits no edema.  No lower extremity edema.  Skin: Skin is warm and dry. Capillary refill takes less than 2 seconds.  Nursing note and vitals reviewed.  ED Treatments / Results  Labs (all labs ordered are listed, but only abnormal results are displayed) Labs  Reviewed - No data to display  EKG  EKG Interpretation None       Radiology Dg Chest [facility] 1 View  Result Date: 10/28/2015 CLINICAL DATA:  Weakness and tiredness. EXAM: PORTABLE CHEST 1 VIEW COMPARISON:  08/10/2015 FINDINGS: Chronic cardiomegaly and vascular pedicle widening. There is asymmetric prominent convexity of the right mediastinum. There is no edema, consolidation, effusion, or pneumothorax. Low volume chest. No acute osseous finding. IMPRESSION: 1. Chronic cardiomegaly and hypoventilation. 2. Right mediastinal fullness may be technical. Recommend two-view chest x-ray when clinically able. Electronically Signed   By: Monte Fantasia M.D.   On: 10/28/2015 05:10  Procedure note: Ultrasound Guided Peripheral IV Ultrasound guided peripheral 1.88 inch angiocath IV placement performed by me. Indications: Nursing unable to place IV. Details: The antecubital fossa and upper arm were evaluated with a multifrequency linear probe. Patent brachial veins were noted. 1 attempt was made to cannulate a vein under realtime US guidance with successful cannulation of the vein and catheter placement. There is return of non-pulsatile dark red blood. The patient tolerated the procedure well without complications. Images archived electronically.  CPT codes: [phone] and [phone]  Procedures Procedures   Medications Ordered in ED Medications - No data to display   Initial Impression / Assessment and Plan / ED Course  I have reviewed the triage vital signs and the nursing notes.  Pertinent labs & imaging results that were available during my care of the patient were reviewed by me and considered in my medical decision making (see chart for details).  Clinical Course    65 yo F With a chief complaint of altered mental status. Also has a decreased urinary output per the family. Patient had a new Foley placed with good urinary output. Found to have urinary tract infection. Given Rocephin. Admit.  Final  Clinical Impressions(s) / ED Diagnoses   Final diagnoses:  None    New Prescriptions New Prescriptions   No medications on file     Deno Etienne, DO 10/28/15 0630

## 2015-10-28 NOTE — Progress Notes (Signed)
Pharmacy Antibiotic Note  [name] is a 65 y.o. female admitted on 10/28/2015 with inability to urinate and decreased UOP.  Pharmacy has been consulted for ceftriaxone dosing. She received 1 g at 06:47. MD also ordered another 2 g which has not been given yet. Blood and urine cultures are pending.  Plan: D/c ceftriaxone 2 g IV Ceftriaxone 1 g IV now (to complete 2 g this morning) then 1 g IV q24h Pharmacy signing off but will follow peripherally - please reconsult if needed     No data recorded.   Recent Labs Lab 10/28/15 0210 10/28/15 0331 10/28/15 0751  WBC 16.8* 23.8*  --   CREATININE 1.06* 1.17*  --   LATICACIDVEN  --   --  5.34*    CrCl cannot be calculated (Unknown ideal weight.).    Allergies  Allergen Reactions  . Ivp Dye [Iodinated Diagnostic Agents] Other (See Comments)    "hot and sweaty and almost passed out"  . Aspirin Hives  . Sulfa Antibiotics Hives    Thank you for allowing pharmacy to be a part of this patient's care.  Renold Genta, PharmD, BCPS Clinical Pharmacist Pager: [phone] 10/28/2015 8:09 AM

## 2015-10-28 NOTE — H&P (Signed)
History and Physical    Astria Madron [id number] DOB: Sep 21, 1950 DOA: 10/28/2015  PCP: [name], MD Patient coming from: home  Chief Complaint: ams  HPI: [name] is a 65 y.o. female with medical history significant for hypertension, kidney stones, hypothyroidism, cervical neuropathy, chronic Foley presents to emergency Department chief complaint of altered mental status. Initial evaluation reveals sepsis likely related to urinary tract infection.  Information is obtained from the husband who is at the bedside and his primary caregiver. He reports patient "doing good" until yesterday afternoon. He reports she frequently has issues with her formally leaking not draining etc. He states that the home health nurse frequently changes the Foley. Yesterday she developed leaking around her Foley. He reports by the evening she became quite lethargic. He denies any vomiting diarrhea. He states she did not complain of any pain or discomfort. He noted her change in mental status around 7 PM and at 12 midnight came to the emergency department. No reports of fever chills headache dizziness syncope or near-syncope. He does report she is wheelchair-bound and has wound VAC on her sacral wound which she removed prior to transport. He reports having applied a wet-to-dry dressing.    ED Course: In the emergency department sepsis protocol initiated with fluid bolus and Rocephin. Workup reveals a urinary tract infection. In the emergency department she's hemodynamically stable but tachycardic and quite lethargic  Review of Systems: As per HPI otherwise 10 point review of systems negative.   Ambulatory Status: She is wheelchair-bound at home.  Past Medical History:  Diagnosis Date  . Arthritis    "all over" (04/13/2012)  . Cancer of right breast ([place]) 2011  . Cervical neuropathy    PERIPHERAL NEUROPATHY , HAD CERVICAL FUSION  . Difficult intubation    Fastrach #4 LMA then # 7 ETT used in 2006  [facility], cervical laminectomy)  . Hypercholesteremia   . Hypertension   . Hypothyroidism   . Kidney stones 1990's   "twice; passed in hospital on my own" (04/13/2012)  . Kidney stones 08/10/2015  . PONV (postoperative nausea and vomiting)   . Sacral decubitus ulcer 10/2014    Past Surgical History:  Procedure Laterality Date  . ABDOMINAL HYSTERECTOMY  ~ 1976  . BACK SURGERY    . BREAST BIOPSY Right 2011  . BREAST LUMPECTOMY Right 2011  . CALDWELL LUC  ~ 2003   "benign tumor removed from up under gum" (04/13/2012)  . DACROCYSTORHINOSTOMY  ~ 2000   "put stents in my tear ducts; both eyes" (04/13/2012)  . EYE SURGERY  2004   STENTS TO BIL EYES  . I&D EXTREMITY Bilateral 11/03/2014   Procedure: IRRIGATION AND DEBRIDEMENT BILATERAL HEEL;  Surgeon: Irene Limbo, MD;  Location: [place];  Service: Plastics;  Laterality: Bilateral;  . INCISION AND DRAINAGE OF WOUND Left 11/03/2014   Procedure: IRRIGATION AND DEBRIDEMENT SACRAL ULCER;  Surgeon: Irene Limbo, MD;  Location: [place];  Service: Plastics;  Laterality: Left;  . JOINT REPLACEMENT    . POSTERIOR CERVICAL LAMINECTOMY  2006  . TONSILLECTOMY AND ADENOIDECTOMY  ~ 1954  . TOTAL KNEE ARTHROPLASTY WITH REVISION COMPONENTS  04/13/2012   Procedure: TOTAL KNEE ARTHROPLASTY WITH REVISION COMPONENTS;  Surgeon: Hessie Dibble, MD;  Location: [place];  Service: Orthopedics;  Laterality: Left;    Social History   Social History  . Marital status: Married    Spouse name: N/A  . Number of children: N/A  . Years of education: N/A   Occupational History  .  Not on file.   Social History Main Topics  . Smoking status: Former Smoker    Packs/day: 0.50    Years: 50.00    Types: Cigarettes    Quit date: 11/06/2014  . Smokeless tobacco: Never Used  . Alcohol use No  . Drug use: No  . Sexual activity: Not Currently   Other Topics Concern  . Not on file   Social History Narrative  . No narrative on file  She lives at home with her husband  she is wheelchair-bound  Allergies  Allergen Reactions  . Ivp Dye [Iodinated Diagnostic Agents] Other (See Comments)    "hot and sweaty and almost passed out"  . Aspirin Hives  . Sulfa Antibiotics Hives    Family History  Problem Relation Age of Onset  . Heart disease Father     Prior to Admission medications   Medication Sig Start Date End Date Taking? Authorizing Provider  acetaminophen (TYLENOL) 500 MG tablet Take 500 mg by mouth every 6 (six) hours as needed for mild pain or moderate pain.    Historical Provider, MD  alfuzosin (UROXATRAL) 10 MG 24 hr tablet Take 1 tablet (10 mg total) by mouth daily with breakfast. 08/13/15   Costin Karlyne [name], MD  ascorbic acid (VITAMIN C) 500 MG tablet Take 1 tablet (500 mg total) by mouth daily. 11/07/14   Francesca Oman, DO  baclofen (LIORESAL) 10 MG tablet Take 30 mg by mouth 2 (two) times daily. 09/26/14   Historical Provider, MD  DULoxetine (CYMBALTA) 60 MG capsule Take 60 mg by mouth daily after supper.     Historical Provider, MD  ferrous sulfate 325 (65 FE) MG tablet Take 325 mg by mouth daily with breakfast.    Historical Provider, MD  folic acid (FOLVITE) 1 MG tablet Take 1 mg by mouth daily after supper.     Historical Provider, MD  furosemide (LASIX) 40 MG tablet Take 40 mg by mouth daily after supper.     Historical Provider, MD  gabapentin (NEURONTIN) 800 MG tablet Take 1,600 mg by mouth 2 (two) times daily. 09/22/14   Historical Provider, MD  levothyroxine (SYNTHROID, LEVOTHROID) 125 MCG tablet Take 1 tablet (125 mcg total) by mouth daily before breakfast. Patient taking differently: Take 125 mcg by mouth daily after supper.  11/07/14   Francesca Oman, DO  metoprolol (LOPRESSOR) 100 MG tablet Take 100 mg by mouth daily after supper. 10/28/14   Historical Provider, MD  morphine (MS CONTIN) 30 MG 12 hr tablet Take 30 mg by mouth 3 (three) times daily. 10am, 12pm, 8pm 08/06/15   Historical Provider, MD  Multiple Vitamin (MULTIVITAMIN WITH MINERALS)  TABS tablet Take 1 tablet by mouth daily. 11/07/14   Francesca Oman, DO  NIFEdipine (PROCARDIA XL/ADALAT-CC) 30 MG 24 hr tablet Take 30 mg by mouth daily after supper.     Historical Provider, MD  oxyCODONE-acetaminophen (PERCOCET) 10-325 MG per tablet Take 1-2 tablets by mouth 3 (three) times daily. Take 1 tablet by mouth daily at 7am, 2 tablets at 10am and 4pm 10/10/14   Historical Provider, MD  pantoprazole (PROTONIX) 40 MG tablet Take 1 tablet (40 mg total) by mouth daily. Patient taking differently: Take 40 mg by mouth at bedtime.  11/07/14   Francesca Oman, DO  zinc sulfate 220 MG capsule Take 1 capsule (220 mg total) by mouth daily. 11/07/14   Francesca Oman, DO    Physical Exam: Vitals:   10/28/15 0645 10/28/15 0700  10/28/15 0715 10/28/15 0730  BP: 122/68 128/64 136/63 112/73  Pulse: 117 117 115 120  Resp: 20 21 22 19   SpO2: 92% 94% 93% 95%     General:  Appears Quite lethargic obese Eyes:  PERRL, EOMI, normal lids, iris ENT:  grossly normal hearing, lips & tongue, mucous membranes of her mouth are dry but pink Neck:  no LAD, masses or thyromegaly Cardiovascular:  Tachycardic heart sounds somewhat distant, no m/r/g. Trace LE edema.  Respiratory:  Respirations somewhat shallow breath sounds distant I hear no wheezes or crackles Abdomen:  soft, ntnd, sluggish bowel sounds Skin:  no rash or induration seen on limited exam resting to sacral wound Musculoskeletal:  grossly normal tone BUE/BLE, good ROM, no bony abnormality Psychiatric:  grossly normal mood and affect, speech fluent and appropriate, AOx3 Neurologic:  Quite lethargic. Opens eyes and moans to vigorous sternal rub will attempt to follow commands  Labs on Admission: I have personally reviewed following labs and imaging studies  CBC:  Recent Labs Lab 10/28/15 0210 10/28/15 0331  WBC 16.8* 23.8*  NEUTROABS 14.8* 20.9*  HGB 10.6* 9.8*  HCT 36.4 33.1*  MCV 91.0 90.9  PLT 406* [id number]   Basic Metabolic Panel:  Recent  Labs Lab 10/28/15 0210 10/28/15 0331  NA 139 138  K 4.3 4.3  CL 106 104  CO2 26 27  GLUCOSE 186* 182*  BUN 17 19  CREATININE 1.06* 1.17*  CALCIUM 9.3 9.0   GFR: CrCl cannot be calculated (Unknown ideal weight.). Liver Function Tests:  Recent Labs Lab 10/28/15 0210 10/28/15 0331  AST 24 27  ALT 16 16  ALKPHOS 92 88  BILITOT 0.2* 0.5  PROT 9.0* 8.9*  ALBUMIN 3.1* 3.0*   No results for input(s): LIPASE, AMYLASE in the last 168 hours. No results for input(s): AMMONIA in the last 168 hours. Coagulation Profile: No results for input(s): INR, PROTIME in the last 168 hours. Cardiac Enzymes: No results for input(s): CKTOTAL, CKMB, CKMBINDEX, TROPONINI in the last 168 hours. BNP (last 3 results) No results for input(s): PROBNP in the last 8760 hours. HbA1C: No results for input(s): HGBA1C in the last 72 hours. CBG: No results for input(s): GLUCAP in the last 168 hours. Lipid Profile: No results for input(s): CHOL, HDL, LDLCALC, TRIG, CHOLHDL, LDLDIRECT in the last 72 hours. Thyroid Function Tests: No results for input(s): TSH, T4TOTAL, FREET4, T3FREE, THYROIDAB in the last 72 hours. Anemia Panel: No results for input(s): VITAMINB12, FOLATE, FERRITIN, TIBC, IRON, RETICCTPCT in the last 72 hours. Urine analysis:    Component Value Date/Time   COLORURINE YELLOW 10/28/2015 0409   APPEARANCEUR CLOUDY (A) 10/28/2015 0409   LABSPEC 1.013 10/28/2015 0409   PHURINE 6.5 10/28/2015 0409   GLUCOSEU NEGATIVE 10/28/2015 0409   HGBUR SMALL (A) 10/28/2015 0409   BILIRUBINUR NEGATIVE 10/28/2015 0409   KETONESUR NEGATIVE 10/28/2015 0409   PROTEINUR 30 (A) 10/28/2015 0409   UROBILINOGEN 1.0 11/01/2014 1127   NITRITE NEGATIVE 10/28/2015 0409   LEUKOCYTESUR LARGE (A) 10/28/2015 0409    Creatinine Clearance: CrCl cannot be calculated (Unknown ideal weight.).  Sepsis Labs: @LABRCNTIP (procalcitonin:4,lacticidven:4) )No results found for this or any previous visit (from the past 240  hour(s)).   Radiological Exams on Admission: Dg Chest Port 1 View  Result Date: 10/28/2015 CLINICAL DATA:  Weakness and tiredness. EXAM: PORTABLE CHEST 1 VIEW COMPARISON:  08/10/2015 FINDINGS: Chronic cardiomegaly and vascular pedicle widening. There is asymmetric prominent convexity of the right mediastinum. There is no edema, consolidation, effusion, or  pneumothorax. Low volume chest. No acute osseous finding. IMPRESSION: 1. Chronic cardiomegaly and hypoventilation. 2. Right mediastinal fullness may be technical. Recommend two-view chest x-ray when clinically able. Electronically Signed   By: Monte Fantasia M.D.   On: 10/28/2015 05:10   EKG pending  Assessment/Plan Principal Problem:   Sepsis ([place]) Active Problems:   HTN (hypertension)   Sacral ulcer   Hypothyroidism   Paraplegia ([place])   AKI (acute kidney injury) ([place])   Encephalopathy acute   UTI (lower urinary tract infection)   #1. Sepsis related to urinary tract infection. WBCs 23, lactic acid 5.3 up from 4.8, urinalysis consistent with UTI, heart rate 117, acute kidney injury, acute encephalopathy. ABG pH 7.40 PCO2 38.4 PO2 60 bicarbonate 24. She received 3 L of normal saline and Rocephin in the emergency department. Second lactic acid trending up. Of note patient hospitalized in May of this year for same. At that time Escherichia coli UTI with nephrolithiasis -Admit to step down -Continue fluid resuscitation -Continue Rocephin -Follow blood cultures -Obtain a urine culture  #2. Urinary tract infection. Patient with chronic Foley due to sacral wound with wound VAC. Urinalysis consistent with UTI. Of note admission in May of this year for the same. Discharge summary indicates initially broad-spectrum antibiotics used. -Obtain urine culture -consider broadening antibiotics  #3. Acute encephalopathy due to sepsis. ABG as noted above. History of same. Of note CT of the head done in May of this year without acute  abnormality -Fluid resuscitation -Antibiotics -If no improvement consider a CT  #4. Acute kidney injury likely related to above. While.. Creatinine on admission 1.17. -Fluids as noted above -Hold nephrotoxins -Monitor urine output -Recheck in the morning  #5. Hypothyroidism. Home medications include Synthroid. -Obtain a TSH -Continue home meds  #.7 Sacral decubitus ulcer due to decreased mobility secondary to paraplegia. Currently dressing intact. She has wound VAC at home. Husband removed a prior to transport -Wound consult  #8. Hypertension. Fair control in the emergency department. Trending down somewhat at point of admission. Home medications include Lasix, metoprolol, Procardia -Hold home meds for now -Monitor closely -Resume home meds as indicated  #9. Anemia. History of same. Hemoglobin 9.8 on admission. No signs symptoms of active bleeding -Monitor  #10. Right breast cancer. Not currently on therapy. -Outpatient follow-up  #11. Chronic pain related to arthritis paraplegia deconditioning. Home meds include MS Contin and percocet with neurontin and baclofen -Hold meds for now secondary to #2 -Resume as indicated    DVT prophylaxis: scd  Code Status: full  Family Communication: husband at bedside  Disposition Plan: home  Consults called: none  Admission status: inpateint    Radene Gunning MD [facility]  If 7PM-7AM, please contact night-coverage [email or web address] Password [facility]  10/28/2015, 7:54 AM

## 2015-10-28 NOTE — ED Notes (Signed)
Paged [facility] for unassigned admission @ [phone]

## 2015-10-28 NOTE — Progress Notes (Addendum)
CRITICAL VALUE ALERT  Critical value received:  lactic  Date of notification:  10/28/2015  Time of notification:  1800  Critical value read back:Yes.    Nurse who received Rosela Supak  MD notified (1st page):  gherge   Time of first page:  1805,text page  MD notified (2nd page):  Time of second page:  Responding MD:   Time MD responded:

## 2015-10-28 NOTE — Progress Notes (Signed)
Pharmacy Antibiotic Note  [name] is a 65 y.o. female admitted on 10/28/2015 with sepsis.  Pharmacy has been consulted for vancomycin and cefepime dosing. WBC 23.8, Tmax 102.3, LA 5.34, CrCl ~ 45 ml/min  Pt received ceftriaxone 1g x1 in ED  Plan: - Cefepime 2g IV Q24H - Vancomycin 2g IV x1, then 750 mg Q12H - Monitor renal fxn, c/s, clinical status, VT at Css prn  Height: 5\' 3"  (160 cm) Weight: 201 lb (91.4 kg) IBW/kg (Calculated) : 52.4  Temp (24hrs), Avg:102.3 F (39.1 C), Min:102.3 F (39.1 C), Max:102.3 F (39.1 C)   Recent Labs Lab 10/28/15 0210 10/28/15 0331 10/28/15 0751  WBC 16.8* 23.8*  --   CREATININE 1.06* 1.17*  --   LATICACIDVEN  --   --  5.34*    Estimated Creatinine Clearance: 45.2 mL/min (by C-G formula based on SCr of 1.17 mg/dL).    Allergies  Allergen Reactions  . Ivp Dye [Iodinated Diagnostic Agents] Other (See Comments)    "hot and sweaty and almost passed out"  . Aspirin Hives  . Sulfa Antibiotics Hives    Antimicrobials this admission: 7/23 ceftriaxone >> 7/23 7/23 vanc >>  7/23 cefepime >>   Microbiology results: 7/23 BCx:  7/23 UCx:     Thank you for allowing pharmacy to be a part of this patient's care.  Gwenlyn Perking, PharmD PGY1 Pharmacy Resident Pager: [phone] 10/28/2015 10:30 AM

## 2015-10-29 ENCOUNTER — Encounter (HOSPITAL_COMMUNITY): Payer: Self-pay

## 2015-10-29 LAB — COMPREHENSIVE METABOLIC PANEL
ALT: 15 U/L (ref 14–54)
ANION GAP: 6 (ref 5–15)
AST: 20 U/L (ref 15–41)
Albumin: 2.2 g/dL — ABNORMAL LOW (ref 3.5–5.0)
Alkaline Phosphatase: 64 U/L (ref 38–126)
BUN: 17 mg/dL (ref 6–20)
CHLORIDE: 109 mmol/L (ref 101–111)
CO2: 25 mmol/L (ref 22–32)
CREATININE: 1.07 mg/dL — AB (ref 0.44–1.00)
Calcium: 8 mg/dL — ABNORMAL LOW (ref 8.9–10.3)
GFR, EST NON AFRICAN AMERICAN: 52 mL/min — AB (ref >60)
Glucose, Bld: 145 mg/dL — ABNORMAL HIGH (ref 65–99)
POTASSIUM: 3.4 mmol/L — AB (ref 3.5–5.1)
Sodium: 140 mmol/L (ref 135–145)
Total Bilirubin: 0.9 mg/dL (ref 0.3–1.2)
Total Protein: 7.1 g/dL (ref 6.5–8.1)

## 2015-10-29 LAB — BLOOD CULTURE ID PANEL (REFLEXED)
ACINETOBACTER BAUMANNII: NOT DETECTED
CANDIDA ALBICANS: NOT DETECTED
CANDIDA GLABRATA: NOT DETECTED
CANDIDA KRUSEI: NOT DETECTED
Candida parapsilosis: NOT DETECTED
Candida tropicalis: NOT DETECTED
Carbapenem resistance: NOT DETECTED
ENTEROBACTER CLOACAE COMPLEX: NOT DETECTED
ENTEROBACTERIACEAE SPECIES: DETECTED — AB
ESCHERICHIA COLI: DETECTED — AB
Enterococcus species: NOT DETECTED
HAEMOPHILUS INFLUENZAE: NOT DETECTED
KLEBSIELLA OXYTOCA: NOT DETECTED
Klebsiella pneumoniae: NOT DETECTED
Listeria monocytogenes: NOT DETECTED
METHICILLIN RESISTANCE: NOT DETECTED
NEISSERIA MENINGITIDIS: NOT DETECTED
PSEUDOMONAS AERUGINOSA: NOT DETECTED
Proteus species: NOT DETECTED
STREPTOCOCCUS AGALACTIAE: NOT DETECTED
STREPTOCOCCUS PYOGENES: NOT DETECTED
STREPTOCOCCUS SPECIES: NOT DETECTED
Serratia marcescens: NOT DETECTED
Staphylococcus aureus (BCID): NOT DETECTED
Staphylococcus species: NOT DETECTED
Streptococcus pneumoniae: NOT DETECTED
Vancomycin resistance: NOT DETECTED

## 2015-10-29 LAB — POCT I-STAT, CHEM 8
BUN: 24 mg/dL — ABNORMAL HIGH (ref 6–20)
CALCIUM ION: 1.06 mmol/L — AB (ref 1.12–1.23)
Chloride: 103 mmol/L (ref 101–111)
Creatinine, Ser: 1.2 mg/dL — ABNORMAL HIGH (ref 0.44–1.00)
GLUCOSE: 180 mg/dL — AB (ref 65–99)
HCT: 33 % — ABNORMAL LOW (ref 36.0–46.0)
HEMOGLOBIN: 11.2 g/dL — AB (ref 12.0–15.0)
POTASSIUM: 4.4 mmol/L (ref 3.5–5.1)
Sodium: 141 mmol/L (ref 135–145)
TCO2: 29 mmol/L (ref 0–100)

## 2015-10-29 LAB — CG4 I-STAT (LACTIC ACID)
LACTIC ACID, VENOUS: 3.27 mmol/L — AB (ref 0.5–1.9)
Lactic Acid, Venous: 4.93 mmol/L (ref 0.5–1.9)

## 2015-10-29 LAB — HEMOGLOBIN A1C
Hgb A1c MFr Bld: 5.5 % (ref 4.8–5.6)
Mean Plasma Glucose: 111 mg/dL

## 2015-10-29 LAB — LACTIC ACID, PLASMA
LACTIC ACID, VENOUS: 3.3 mmol/L — AB (ref 0.5–1.9)
Lactic Acid, Venous: 2.9 mmol/L (ref 0.5–1.9)
Lactic Acid, Venous: 2 mmol/L (ref 0.5–1.9)

## 2015-10-29 LAB — MAGNESIUM: Magnesium: 1.8 mg/dL (ref 1.7–2.4)

## 2015-10-29 LAB — POCT I-STAT TROPONIN I: TROPONIN I, POC: 0 ng/mL (ref 0.00–0.08)

## 2015-10-29 MED ORDER — ADULT MULTIVITAMIN W/MINERALS CH
1.0000 | ORAL_TABLET | Freq: Every day | ORAL | Status: DC
  Administered 2015-10-29 – 2015-10-31 (×3): 1 via ORAL
  Filled 2015-10-29 (×3): qty 1

## 2015-10-29 MED ORDER — PRO-STAT SUGAR FREE PO LIQD
30.0000 mL | Freq: Two times a day (BID) | ORAL | Status: DC
  Administered 2015-10-29 – 2015-10-31 (×3): 30 mL via ORAL
  Filled 2015-10-29 (×3): qty 30

## 2015-10-29 MED ORDER — BOOST / RESOURCE BREEZE PO LIQD
1.0000 | ORAL | Status: DC
  Administered 2015-10-30: 1 via ORAL

## 2015-10-29 MED ORDER — SODIUM CHLORIDE 0.9 % IV SOLN
Freq: Once | INTRAVENOUS | Status: AC
Start: 2015-10-29 — End: 2015-10-29
  Administered 2015-10-29: 18:00:00 via INTRAVENOUS

## 2015-10-29 MED ORDER — DEXTROSE 5 % IV SOLN
2.0000 g | Freq: Two times a day (BID) | INTRAVENOUS | Status: DC
  Filled 2015-10-29: qty 2

## 2015-10-29 MED ORDER — SODIUM CHLORIDE 0.9 % IV BOLUS (SEPSIS)
1000.0000 mL | Freq: Once | INTRAVENOUS | Status: AC
  Administered 2015-10-29: 1000 mL via INTRAVENOUS

## 2015-10-29 MED ORDER — SODIUM CHLORIDE 0.9 % IV SOLN
INTRAVENOUS | Status: AC
  Administered 2015-10-29: 13:00:00 via INTRAVENOUS
  Administered 2015-10-29 – 2015-10-30 (×2): 1000 mL via INTRAVENOUS

## 2015-10-29 MED ORDER — CEFTRIAXONE SODIUM 2 G IJ SOLR
2.0000 g | INTRAMUSCULAR | Status: DC
  Administered 2015-10-29 – 2015-10-30 (×2): 2 g via INTRAVENOUS
  Filled 2015-10-29 (×3): qty 2

## 2015-10-29 MED ORDER — POTASSIUM CHLORIDE CRYS ER 20 MEQ PO TBCR
40.0000 meq | EXTENDED_RELEASE_TABLET | Freq: Once | ORAL | Status: AC
Start: 2015-10-29 — End: 2015-10-29
  Administered 2015-10-29: 40 meq via ORAL
  Filled 2015-10-29: qty 2

## 2015-10-29 NOTE — Progress Notes (Signed)
Latest lactic acid-2.9 Md aware with order.ivf ns bolus given.

## 2015-10-29 NOTE — Progress Notes (Signed)
Pt adm w sepsis. Per chart had hhc and wound vac. Adv homecare follows for hhrn. Alerted [facility] of adm. Will cont to follow.

## 2015-10-29 NOTE — Progress Notes (Signed)
Latest lactic acid-3.3 MD made aware with order.

## 2015-10-29 NOTE — Progress Notes (Signed)
Initial Nutrition Assessment  DOCUMENTATION CODES:   Obesity unspecified  INTERVENTION:   -Boost Breeze po daily, each supplement provides 250 kcal and 9 grams of protein -MVI daily -30 ml Prostat BID, each supplement provides 100 kcals and 15 grams protein   NUTRITION DIAGNOSIS:   Increased nutrient needs related to wound healing as evidenced by estimated needs.  GOAL:   Patient will meet greater than or equal to 90% of their needs  MONITOR:   Supplement acceptance, PO intake, Labs, Weight trends, Skin, I & O's  REASON FOR ASSESSMENT:   Consult Wound healing  ASSESSMENT:   Loreal Valente is a 65 y.o. female with medical history significant for hypertension, kidney stones, hypothyroidism, cervical neuropathy, chronic Foley presents to emergency Department chief complaint of altered mental status. Initial evaluation reveals sepsis likely related to urinary tract infection.  Pt admitted with sepsis.   Hx obtained from pt and pt husband at bedside. She reveals that her appetite is generally good, consuming 3 meals per day (Breakfast: boiled egg, toast, 2 slices of bacon, lunch: PBJ sandwch, Dinner: meat, starch, and vegetable). Pt and husband reveal pt consumes [employer] daily, as well as MVI, vitamin C, and zinc supplements to promote with wound healing. She confirms that she dislikes Ensure and Boost supplements. She verbalizes she has been trying to increase protein in her diet to support wound healing.  Pt reports UBW is around 165#, but suspects weight gain due to improved PO intake.   Nutrition-Focused physical exam completed. Findings are no fat depletion, no muscle depletion, and mild edema.   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 She is followed by the wound center as an outpatient.   Discussed importance of good intake (particularly protein) and intake of vitamins and minerals to support wound healing. Pt  requesting Boost Breeze supplement and is also amenable to Prostat.   Case discussed with RN.  Labs reviewed: K: 3.4.   Diet Order:  Diet Heart Room service appropriate? Yes; Fluid consistency: Thin  Skin:  Wound (see comment) (stage IV lt ischium)  Last BM:  10/26/15  Height:   Ht Readings from Last 1 Encounters:  10/28/15 5\' 3"  (1.6 m)    Weight:   Wt Readings from Last 1 Encounters:  10/29/15 204 lb 2.3 oz (92.6 kg)    Ideal Body Weight:  52.3 kg  BMI:  Body mass index is 36.16 kg/m.  Estimated Nutritional Needs:   Kcal:  1600-1800  Protein:  105-120 grams  Fluid:  >1.6 L  EDUCATION NEEDS:   Education needs addressed  Nijae Doyel A. Jimmye Norman, RD, LDN, CDE Pager: [phone] After hours Pager: [phone]

## 2015-10-29 NOTE — Progress Notes (Signed)
PHARMACY - PHYSICIAN COMMUNICATION CRITICAL VALUE ALERT - BLOOD CULTURE IDENTIFICATION (BCID)  Results for orders placed or performed in visit on 10/10/15  Blood Culture ID Panel (Reflexed) (Collected: 10/28/2015  2:10 AM)  Result Value Ref Range   Enterococcus species NOT DETECTED NOT DETECTED   Vancomycin resistance NOT DETECTED NOT DETECTED   Listeria monocytogenes NOT DETECTED NOT DETECTED   Staphylococcus species NOT DETECTED NOT DETECTED   Staphylococcus aureus NOT DETECTED NOT DETECTED   Methicillin resistance NOT DETECTED NOT DETECTED   Streptococcus species NOT DETECTED NOT DETECTED   Streptococcus agalactiae NOT DETECTED NOT DETECTED   Streptococcus pneumoniae NOT DETECTED NOT DETECTED   Streptococcus pyogenes NOT DETECTED NOT DETECTED   Acinetobacter baumannii NOT DETECTED NOT DETECTED   Enterobacteriaceae species DETECTED (A) NOT DETECTED   Enterobacter cloacae complex NOT DETECTED NOT DETECTED   Escherichia coli DETECTED (A) NOT DETECTED   Klebsiella oxytoca NOT DETECTED NOT DETECTED   Klebsiella pneumoniae NOT DETECTED NOT DETECTED   Proteus species NOT DETECTED NOT DETECTED   Serratia marcescens NOT DETECTED NOT DETECTED   Carbapenem resistance NOT DETECTED NOT DETECTED   Haemophilus influenzae NOT DETECTED NOT DETECTED   Neisseria meningitidis NOT DETECTED NOT DETECTED   Pseudomonas aeruginosa NOT DETECTED NOT DETECTED   Candida albicans NOT DETECTED NOT DETECTED   Candida glabrata NOT DETECTED NOT DETECTED   Candida krusei NOT DETECTED NOT DETECTED   Candida parapsilosis NOT DETECTED NOT DETECTED   Candida tropicalis NOT DETECTED NOT DETECTED    Name of physician (or Provider) ContactedBaltazar Najjar NP Changes to prescribed antibiotics required: Antibiotics narrowed to ceftriaxone monotherapy  Erin Hearing PharmD., BCPS Clinical Pharmacist Pager [phone] 10/29/2015 7:50 PM

## 2015-10-29 NOTE — Progress Notes (Signed)
[facility] PROGRESS NOTE  [name] [id number] DOB: 1950/09/13 DOA: 10/28/2015 PCP: [name], MD  Assessment/Plan:  #1. Sepsis related to urinary tract infection. WBCs 23, lactic acid 5.3 up from 4.8, urinalysis consistent with UTI, heart rate 117, acute kidney injury, acute encephalopathy. ABG pH 7.40 PCO2 38.4 PO2 60 bicarbonate 24. She received 3 L of normal saline and Rocephin in the emergency department. Second lactic acid trending up. Of note patient hospitalized in May of this year for same. At that time Escherichia coli UTI with nephrolithiasis -Admit to step down -Continue fluid resuscitation, given 2 1L during day and MIVF -Continue Rocephin -Follow blood cultures -follow a urine culture  #2. Urinary tract infection. Patient with chronic Foley due to sacral wound with wound VAC. Urinalysis consistent with UTI. Of note admission in May of this year for the same. Discharge summary indicates initially broad-spectrum antibiotics used. -follow urine culture -consider broadening antibiotics - E. coli  #3. Acute encephalopathy due to sepsis. ABG as noted above. History of same. Of note CT of the head done in May of this year without acute abnormality -Fluid resuscitation -Antibiotics -better  #4. Acute kidney injury likely related to above. ? While.. Creatinine on admission <1.1 -Fluids as noted above -Hold nephrotoxins -Monitor urine output -Recheck in the morning  #5. Hypothyroidism. Home medications include Synthroid. -Obtain a TSH, nl -Continue home meds  #.7 Sacral decubitus ulcer due to decreased mobility secondary to paraplegia. Currently dressing intact. She has wound VAC at home. Husband removed a prior to transport -Wound consult, wound vac placed tomorroe  #8. Hypertension. Fair control in the emergency department. Trending down somewhat at point of admission. Home medications include Lasix, metoprolol, Procardia -Hold home meds for  now -Monitor closely -Resume home meds as indicated  #9. Anemia. History of same. Hemoglobin 9.8 on admission. No signs symptoms of active bleeding -Monitor  #10. Right breast cancer. Not currently on therapy. -Outpatient follow-up  #11. Chronic pain related to arthritis paraplegia deconditioning. Home meds include MS Contin and percocet with neurontin and baclofen -Hold meds for now secondary to #2 -Resume as indicated    DVT prophylaxis: scd  Code Status: full  Family Communication: husband at bedside  Disposition Plan: home  Consults called: none  Admission status: inpateint  Consultants:  ID - pending  Procedures:  n/a  Antibiotics:  rocephin (indicate start date, and stop date if known)  HPI/Subjective: Complains of back pain. No dysuria. Denies N/v.  Objective: Vitals:   10/29/15 2135 10/29/15 2324  BP: (!) 128/58 (!) 121/54  Pulse:  (!) 105  Resp:  16  Temp:  98.8 F (37.1 C)    Intake/Output Summary (Last 24 hours) at 10/29/15 2355 Last data filed at 10/29/15 2139  Gross per 24 hour  Intake             2503 ml  Output             1350 ml  Net             1153 ml   Filed Weights   10/28/15 1408 10/29/15 0400  Weight: 91.4 kg (201 lb 8 oz) 92.6 kg (204 lb 2.3 oz)    Exam:   General:  No diaphoresis, anxious, no acute distress  Cardiovascular: Regular rate and rhythm no murmurs rubs or gallops  Respiratory: Clear to auscultation bilaterally no more breathing  Abdomen: Nondistended bowel sounds normal nontender palpation  Musculoskeletal: Moving all extremities, no deformity, 5 out of  5 strength    Data Reviewed: Basic Metabolic Panel:  Recent Labs Lab 10/28/15 0210 10/28/15 0331 10/28/15 0402 10/29/15 0609  NA 139 138 141 140  K 4.3 4.3 4.4 3.4*  CL 106 104 103 109  CO2 26 27  --  25  GLUCOSE 186* 182* 180* 145*  BUN 17 19 24* 17  CREATININE 1.06* 1.17* 1.20* 1.07*  CALCIUM 9.3 9.0  --  8.0*  MG  --   --   --  1.8    Liver Function Tests:  Recent Labs Lab 10/28/15 0210 10/28/15 0331 10/29/15 0609  AST 24 27 20   ALT 16 16 15   ALKPHOS 92 88 64  BILITOT 0.2* 0.5 0.9  PROT 9.0* 8.9* 7.1  ALBUMIN 3.1* 3.0* 2.2*   No results for input(s): LIPASE, AMYLASE in the last 168 hours. No results for input(s): AMMONIA in the last 168 hours. CBC:  Recent Labs Lab 10/28/15 0210 10/28/15 0331 10/28/15 0402  WBC 16.8* 23.8*  --   NEUTROABS 14.8* 20.9*  --   HGB 10.6* 9.8* 11.2*  HCT 36.4 33.1* 33.0*  MCV 91.0 90.9  --   PLT 406* 358  --    Cardiac Enzymes: No results for input(s): CKTOTAL, CKMB, CKMBINDEX, TROPONINI in the last 168 hours. BNP (last 3 results) No results for input(s): BNP in the last 8760 hours.  ProBNP (last 3 results) No results for input(s): PROBNP in the last 8760 hours.  CBG:  Recent Labs Lab 10/28/15 1636 10/28/15 2202  GLUCAP 139* 127*    Recent Results (from the past 240 hour(s))  Culture, blood (Routine X 2) w Reflex to ID Panel     Status: Abnormal (Preliminary result)   Collection Time: 10/28/15  2:10 AM  Result Value Ref Range Status   Specimen Description BLOOD LEFT WRIST  Final   Special Requests BOTTLES DRAWN AEROBIC AND ANAEROBIC 5CC   Final   Culture  Setup Time   Final    GRAM NEGATIVE RODS AEROBIC BOTTLE ONLY CRITICAL RESULT CALLED TO, READ BACK BY AND VERIFIED WITH: Tillman Sers PHARMD [id number] 10/29/15 A BROWNING    Culture ESCHERICHIA COLI (A)  Final   Report Status PENDING  Incomplete  Culture, blood (Routine X 2) w Reflex to ID Panel     Status: None (Preliminary result)   Collection Time: 10/28/15  2:10 AM  Result Value Ref Range Status   Specimen Description BLOOD LEFT HAND  Final   Special Requests IN PEDIATRIC BOTTLE 1CC  Final   Culture NO GROWTH 1 DAY  Final   Report Status PENDING  Incomplete  Blood Culture ID Panel (Reflexed)     Status: Abnormal   Collection Time: 10/28/15  2:10 AM  Result Value Ref Range Status   Enterococcus species  NOT DETECTED NOT DETECTED Final   Vancomycin resistance NOT DETECTED NOT DETECTED Final   Listeria monocytogenes NOT DETECTED NOT DETECTED Final   Staphylococcus species NOT DETECTED NOT DETECTED Final   Staphylococcus aureus NOT DETECTED NOT DETECTED Final   Methicillin resistance NOT DETECTED NOT DETECTED Final   Streptococcus species NOT DETECTED NOT DETECTED Final   Streptococcus agalactiae NOT DETECTED NOT DETECTED Final   Streptococcus pneumoniae NOT DETECTED NOT DETECTED Final   Streptococcus pyogenes NOT DETECTED NOT DETECTED Final   Acinetobacter baumannii NOT DETECTED NOT DETECTED Final   Enterobacteriaceae species DETECTED (A) NOT DETECTED Final    Comment: CRITICAL RESULT CALLED TO, READ BACK BY AND VERIFIED WITH: Tillman Sers PHARMD  1933 10/29/15 A BROWNING    Enterobacter cloacae complex NOT DETECTED NOT DETECTED Final   Escherichia coli DETECTED (A) NOT DETECTED Final    Comment: CRITICAL RESULT CALLED TO, READ BACK BY AND VERIFIED WITH: Tillman Sers PHARMD [id number] 10/29/15 A BROWNING    Klebsiella oxytoca NOT DETECTED NOT DETECTED Final   Klebsiella pneumoniae NOT DETECTED NOT DETECTED Final   Proteus species NOT DETECTED NOT DETECTED Final   Serratia marcescens NOT DETECTED NOT DETECTED Final   Carbapenem resistance NOT DETECTED NOT DETECTED Final   Haemophilus influenzae NOT DETECTED NOT DETECTED Final   Neisseria meningitidis NOT DETECTED NOT DETECTED Final   Pseudomonas aeruginosa NOT DETECTED NOT DETECTED Final   Candida albicans NOT DETECTED NOT DETECTED Final   Candida glabrata NOT DETECTED NOT DETECTED Final   Candida krusei NOT DETECTED NOT DETECTED Final   Candida parapsilosis NOT DETECTED NOT DETECTED Final   Candida tropicalis NOT DETECTED NOT DETECTED Final  Urine culture     Status: Abnormal (Preliminary result)   Collection Time: 10/28/15  4:09 AM  Result Value Ref Range Status   Specimen Description URINE, RANDOM  Final   Special Requests NONE  Final   Culture  >=100,000 COLONIES/mL ESCHERICHIA COLI (A)  Final   Report Status PENDING  Incomplete  Culture, blood (Routine X 2) w Reflex to ID Panel     Status: Abnormal (Preliminary result)   Collection Time: 10/28/15 10:00 AM  Result Value Ref Range Status   Specimen Description BLOOD LEFT ANTECUBITAL  Final   Special Requests AEROCOCCUS SPECIES  [place] (A)  Final   Culture NO GROWTH 1 DAY  Final   Report Status PENDING  Incomplete  Culture, blood (Routine X 2) w Reflex to ID Panel     Status: None (Preliminary result)   Collection Time: 10/28/15 10:10 AM  Result Value Ref Range Status   Specimen Description BLOOD LEFT HAND  Final   Special Requests IN PEDIATRIC BOTTLE  4CC  Final   Culture NO GROWTH 1 DAY  Final   Report Status PENDING  Incomplete  MRSA PCR Screening     Status: Abnormal   Collection Time: 10/28/15  2:49 PM  Result Value Ref Range Status   MRSA by PCR POSITIVE (A) NEGATIVE Final    Comment:        The GeneXpert MRSA Assay (FDA approved for NASAL specimens only), is one component of a comprehensive MRSA colonization surveillance program. It is not intended to diagnose MRSA infection nor to guide or monitor treatment for MRSA infections. RESULT CALLED TO, READ BACK BY AND VERIFIED WITH: A.BARLOW,RN 10/28/15 @1809  BY V.WILKINS      Studies: Ct Abdomen Pelvis Wo Contrast  Result Date: 10/28/2015 CLINICAL DATA:  Right flank pain, urinary tract infection. History of stone, hypertension. EXAM: CT ABDOMEN AND PELVIS WITHOUT CONTRAST TECHNIQUE: Multidetector CT imaging of the abdomen and pelvis was performed following the standard protocol without IV contrast. COMPARISON:  CT abdomen is dated 08/10/2015 and 11/01/2014. FINDINGS: Lower chest:  No acute findings. Hepatobiliary: Liver slightly low in density throughout suggesting fatty infiltration. Small layering stones within the nondistended gallbladder. No evidence of acute cholecystitis. Pancreas: No mass or inflammatory process  identified on this un-enhanced exam. Spleen: Within normal limits in size. Adrenals/Urinary Tract: Mild left-sided hydronephrosis. Prominent perinephric edema on the left which is new compared to the previous study. 2 mm left renal stone. No obstructing renal or ureteral stone. Multiple stones again noted within the right renal pelvis,  largest measuring 9 mm, without associated hydronephrosis. No right-sided ureteral stone. Bladder is decompressed by a Foley catheter. Stomach/Bowel: Bowel is normal in caliber. No bowel wall thickening or evidence of bowel wall inflammation seen. Fairly large amount of stool throughout the nondistended colon. Appendix is normal. Stomach appears normal, decompressed. Vascular/Lymphatic: Atherosclerotic changes of the normal caliber abdominal aorta. No enlarged lymph nodes seen. Reproductive: No mass or other significant abnormality. Other: No abscess collection or free intraperitoneal air seen. Musculoskeletal: New sclerotic lesion within the posterior aspect of the left inferior pubic ramus, with adjacent soft tissue air, incompletely imaged. Soft tissue air was identified in this area on earlier CT, described as a probable decubitus ulcer on that report. IMPRESSION: 1. Extensive perinephric edema about the left kidney, highly suspicious for pyelonephritis given the history of UTI. Mild left-sided hydronephrosis. No obstructing renal or ureteral stone. Bladder decompressed by Foley catheter. 2. Bilateral nephrolithiasis. 3. New sclerosis of the posterior portion of the left inferior pubic ramus with aggressive periosteal reaction, consistent with osteomyelitis, with adjacent soft tissue gas compatible with previously described decubitus ulcer. 4. Fatty infiltration of the liver. 5. Cholelithiasis without evidence of acute cholecystitis. 6. Aortic atherosclerosis. These results were called by telephone at the time of interpretation on 10/28/2015 at 11:09 am to Dr. Dyanne Carrel , who  verbally acknowledged these results. Electronically Signed   By: Franki Cabot M.D.   On: 10/28/2015 11:10  Dg Chest Port 1 View  Result Date: 10/28/2015 CLINICAL DATA:  Weakness and tiredness. EXAM: PORTABLE CHEST 1 VIEW COMPARISON:  08/10/2015 FINDINGS: Chronic cardiomegaly and vascular pedicle widening. There is asymmetric prominent convexity of the right mediastinum. There is no edema, consolidation, effusion, or pneumothorax. Low volume chest. No acute osseous finding. IMPRESSION: 1. Chronic cardiomegaly and hypoventilation. 2. Right mediastinal fullness may be technical. Recommend two-view chest x-ray when clinically able. Electronically Signed   By: Monte Fantasia M.D.   On: 10/28/2015 05:10   Scheduled Meds: . baclofen  20 mg Oral QID  . cefTRIAXone (ROCEPHIN)  IV  2 g Intravenous Q24H  . Chlorhexidine Gluconate Cloth  6 each Topical Q0600  . DULoxetine  60 mg Oral Daily  . enoxaparin (LOVENOX) injection  40 mg Subcutaneous Q24H  . feeding supplement  1 Container Oral Q24H  . feeding supplement (PRO-STAT SUGAR FREE 64)  30 mL Oral BID  . furosemide  40 mg Oral Daily  . gabapentin  800 mg Oral BID  . insulin aspart  0-15 Units Subcutaneous TID WC  . insulin aspart  0-5 Units Subcutaneous QHS  . levothyroxine  125 mcg Oral QPC supper  . metoprolol tartrate  50 mg Oral BID  . morphine  15 mg Oral Q12H  . multivitamin with minerals  1 tablet Oral Daily  . mupirocin ointment  1 application Nasal BID  . NIFEdipine  10 mg Oral Q8H  . pantoprazole  40 mg Oral QHS  . sodium chloride flush  3 mL Intravenous Q12H   Continuous Infusions: . sodium chloride 1,000 mL (10/29/15 1938)    Principal Problem:   Sepsis ([place]) Active Problems:   HTN (hypertension)   Sacral ulcer   Hypothyroidism   Paraplegia ([place])   AKI (acute kidney injury) ([place])   Encephalopathy acute   UTI (lower urinary tract infection)    Time spent: [facility] Pager [phone].  If 7PM-7AM, please contact night-coverage at [email or web address], password [facility] 10/29/2015, 11:55 PM  LOS: 1  day

## 2015-10-29 NOTE — Progress Notes (Signed)
Pharmacy Antibiotic Note  [name] is a 65 y.o. female admitted on 10/28/2015 with sepsis.  Pharmacy has been consulted for vancomycin and cefepime dosing - Day #2. WBC up 23.8, Tmax 104.3, LA 5.3>3.3, SCr improving, CrCl ~ 55 ml/min.  Plan: - Increase cefepime to 2g IV Q12H for improving renal function - Vancomycin 750 mg Q12H - Monitor renal fxn, c/s, clinical status, LOT, VT at Css prn  Height: 5\' 3"  (160 cm) Weight: 201 lb (91.4 kg) IBW/kg (Calculated) : 52.4  Temp (24hrs), Avg:102 F (38.9 C), Min:99.8 F (37.7 C), Max:104.3 F (40.2 C)   Recent Labs Lab 10/28/15 0210  10/28/15 0331 10/28/15 0402 10/28/15 0751 10/28/15 1228 10/28/15 1715 10/29/15 0609 10/29/15 0938  WBC 16.8*  --  23.8*  --   --   --   --   --   --   CREATININE 1.06*  --  1.17* 1.20*  --   --   --  1.07*  --   LATICACIDVEN  --   < >  --  3.27* 5.34* 3.89* 4.5*  --  3.3*  < > = values in this interval not displayed.  Estimated Creatinine Clearance: 55.2 mL/min (by C-G formula based on SCr of 1.07 mg/dL).    Allergies  Allergen Reactions  . Ivp Dye [Iodinated Diagnostic Agents] Other (See Comments)    "hot and sweaty and almost passed out"  . Aspirin Hives  . Sulfa Antibiotics Hives    Antimicrobials this admission: 7/23 ceftriaxone >> 7/23 7/23 vanc >>  7/23 cefepime >>   Microbiology results: 7/23 BCx:  7/23 UCx:   7/23 mrsa pcr: +   Elicia Lamp, PharmD, BCPS Clinical Pharmacist Pager [phone] 10/29/2015 10:48 AM

## 2015-10-29 NOTE — Consult Note (Addendum)
[facility] Nurse wound consult note Reason for Consult: home wound VAC Patient followed at the [facility] and has a HHRN with home NPWT to the left ischium Patient with new diagnosis of osteomyelitis left inferior pubic ramus.  When I asked about "bone infection" patient and husband have not heard this term before.  NPWT is contraindicated in patients with untreated osteomyelitis, however this patient has been placed on vanc at the time of admission.   Wound type: Stage 4 Pressure Injury left ischium Pressure Ulcer POA: Yes Measurement: 3.5cm x 6cm x 5cm  Wound bed: clean, pink, moist, probes to bone  Drainage (amount, consistency, odor) heavy, not purulent Periwound: intact  Dressing procedure/placement/frequency: Will plan to place NPWT dressing back to this wound, however will contact IM group to verify if they or the ID doctors need to see the wound prior to placement.  Will add low air loss mattress for pressure redistribution. Will add nutrition consult to maximize wound healing.  [place] team will follow along for assistance with VAC dressing due to complexity with bridging.   Para March RN,CWOCN [id number] addendum: Spoke with Dr. Aggie Moats, he will plan to contact ID for the possible osteomyelitis noted on CT scan during this admission.  Will hold off on NPWT VAC placement until after this consultation, in case ID MD wants to see this wound.  I have notified patient and husband of the same. Patient has been placed on low air loss mattress at this time. Marica Otter RN,CWOCN

## 2015-10-29 NOTE — Progress Notes (Signed)
CRITICAL VALUE ALERT  Critical value received:  Lactic Acid 2.0  Date of notification:  10/29/15  Time of notification:  2220  Critical value read back:Yes.    Nurse who received alert:  Ezekiel Slocumb  MD notified (1st page):  Text paged [facility] MD

## 2015-10-30 ENCOUNTER — Inpatient Hospital Stay (HOSPITAL_COMMUNITY): Payer: Medicare Other

## 2015-10-30 DIAGNOSIS — L89154 Pressure ulcer of sacral region, stage 4: Secondary | ICD-10-CM

## 2015-10-30 LAB — GLUCOSE, CAPILLARY
GLUCOSE-CAPILLARY: 161 mg/dL — AB (ref 65–99)
Glucose-Capillary: 145 mg/dL — ABNORMAL HIGH (ref 65–99)
Glucose-Capillary: 129 mg/dL — ABNORMAL HIGH (ref 65–99)
Glucose-Capillary: 130 mg/dL — ABNORMAL HIGH (ref 65–99)
Glucose-Capillary: 121 mg/dL — ABNORMAL HIGH (ref 65–99)
Glucose-Capillary: 129 mg/dL — ABNORMAL HIGH (ref 65–99)
Glucose-Capillary: 138 mg/dL — ABNORMAL HIGH (ref 65–99)

## 2015-10-30 LAB — URINE CULTURE

## 2015-10-30 LAB — CBC WITH DIFFERENTIAL/PLATELET
BASOS ABS: 0 10*3/uL (ref 0.0–0.1)
Basophils Relative: 0 %
EOS PCT: 2 %
Eosinophils Absolute: 0.1 10*3/uL (ref 0.0–0.7)
HCT: 26.4 % — ABNORMAL LOW (ref 36.0–46.0)
Hemoglobin: 7.5 g/dL — ABNORMAL LOW (ref 12.0–15.0)
LYMPHS PCT: 20 %
Lymphs Abs: 1 10*3/uL (ref 0.7–4.0)
MCH: 25.6 pg — ABNORMAL LOW (ref 26.0–34.0)
MCHC: 28.4 g/dL — ABNORMAL LOW (ref 30.0–36.0)
MCV: 90.1 fL (ref 78.0–100.0)
Monocytes Absolute: 0.4 10*3/uL (ref 0.1–1.0)
Monocytes Relative: 9 %
NEUTROS PCT: 69 %
Neutro Abs: 3.6 10*3/uL (ref 1.7–7.7)
PLATELETS: 301 10*3/uL (ref 150–400)
RBC: 2.93 MIL/uL — AB (ref 3.87–5.11)
RDW: 19.7 % — ABNORMAL HIGH (ref 11.5–15.5)
WBC: 5.1 10*3/uL (ref 4.0–10.5)

## 2015-10-30 LAB — RENAL FUNCTION PANEL
ALBUMIN: 2.3 g/dL — AB (ref 3.5–5.0)
Anion gap: 6 (ref 5–15)
BUN: 8 mg/dL (ref 6–20)
CO2: 28 mmol/L (ref 22–32)
CREATININE: 0.78 mg/dL (ref 0.44–1.00)
Calcium: 8.5 mg/dL — ABNORMAL LOW (ref 8.9–10.3)
Chloride: 107 mmol/L (ref 101–111)
GFR calc Af Amer: >60 mL/min (ref >60)
Glucose, Bld: 90 mg/dL (ref 65–99)
PHOSPHORUS: 1.6 mg/dL — AB (ref 2.5–4.6)
POTASSIUM: 3.1 mmol/L — AB (ref 3.5–5.1)
Sodium: 141 mmol/L (ref 135–145)

## 2015-10-30 LAB — LACTIC ACID, PLASMA: LACTIC ACID, VENOUS: 1.9 mmol/L (ref 0.5–1.9)

## 2015-10-30 LAB — MAGNESIUM: MAGNESIUM: 1.8 mg/dL (ref 1.7–2.4)

## 2015-10-30 MED ORDER — BACID PO TABS
2.0000 | ORAL_TABLET | Freq: Three times a day (TID) | ORAL | Status: DC
  Administered 2015-10-30 – 2015-10-31 (×2): 2 via ORAL
  Filled 2015-10-30 (×5): qty 2

## 2015-10-30 MED ORDER — SODIUM CHLORIDE 0.9% FLUSH
10.0000 mL | Freq: Two times a day (BID) | INTRAVENOUS | Status: DC
  Administered 2015-10-30 – 2015-10-31 (×2): 10 mL

## 2015-10-30 MED ORDER — SODIUM CHLORIDE 0.9% FLUSH: 10.0000 mL | INTRAVENOUS | Status: DC | PRN

## 2015-10-30 MED ORDER — VANCOMYCIN HCL IN DEXTROSE 750-5 MG/150ML-% IV SOLN
750.0000 mg | Freq: Two times a day (BID) | INTRAVENOUS | Status: DC
  Administered 2015-10-30 – 2015-10-31 (×3): 750 mg via INTRAVENOUS
  Filled 2015-10-30 (×4): qty 150

## 2015-10-30 NOTE — Progress Notes (Signed)
Pharmacy Antibiotic Note  [name] is a 65 y.o. female admitted on 10/28/2015 with sepsis due to [facility] pyelonephritis - continuing on CTX. Pharmacy has been consulted for vancomycin dosing for osteomyelitis w/ bone involvement - day #3 (briefly d/c'd 7/24-7/25, restarted by ID - recommend 6 wks of abx). Also on CTX. WBC up 23.8, afeb, LA 5.3>3.3, SCr improving, CrCl ~ 55 ml/min.  Plan: - Add back vancomycin 750 mg Q12H - CTX 2g IV q24h per MD - Monitor renal fxn, c/s, clinical status, VT at Css - Tx x 6wks per ID recs (pending Ecoli sensitivities, needs PICC  Height: 5\' 3"  (160 cm) Weight: 201 lb (91.4 kg) IBW/kg (Calculated) : 52.4  Temp (24hrs), Avg:98.8 F (37.1 C), Min:98 F (36.7 C), Max:100.2 F (37.9 C)   Recent Labs Lab 10/28/15 0210  10/28/15 0331 10/28/15 0402  10/28/15 1715 10/29/15 0609 10/29/15 0938 10/29/15 1649 10/29/15 2134 10/30/15 0027  WBC 16.8*  --  23.8*  --   --   --   --   --   --   --   --   CREATININE 1.06*  --  1.17* 1.20*  --   --  1.07*  --   --   --   --   LATICACIDVEN  --   < >  --  3.27*  < > 4.5*  --  3.3* 2.9* 2.0* 1.9  < > = values in this interval not displayed.  Estimated Creatinine Clearance: 55.6 mL/min (by C-G formula based on SCr of 1.07 mg/dL).    Allergies  Allergen Reactions  . Ivp Dye [Iodinated Diagnostic Agents] Other (See Comments)    "hot and sweaty and almost passed out"  . Aspirin Hives  . Sulfa Antibiotics Hives    Antimicrobials this admission: 7/23 ceftriaxone >> 7/23, 7/24>> 7/23 vanc >> 7/24; 7/25>> 7/23 cefepime >> 7/24   Microbiology results: 7/23 UC: Ecoli 7/23 BCx2: aerococcus 1/2 7/23 BCx2: Lucianne Muss 1/2 (BCID Ecoli) 7/23 mrsa pcr: +   Elicia Lamp, PharmD, [facility] Clinical Pharmacist Pager [phone] 10/30/2015 9:44 AM

## 2015-10-30 NOTE — Progress Notes (Addendum)
[facility] PROGRESS NOTE  [name] [id number] DOB: [birth year] DOA: 10/28/2015 PCP: [name], MD  Assessment/Plan:  #1. Sepsis related to urinary tract infection. On admit WBCs 23, lactic acid 5.3 up from 4.8, urinalysis consistent with UTI, heart rate 117, acute kidney injury, acute encephalopathy. ABG pH 7.40 PCO2 38.4 PO2 60 bicarbonate 24. She received 3 L of normal saline and Rocephin in the emergency department. Second lactic acid trending up. Of note patient hospitalized in May of this year for same. At that time Escherichia coli pyelonephritis. Confirmed and urine culture sensitivities pending. Patient also with osteomyelitis seen on CT scan. Infectious disease consulted, recommending antibiotics 6 weeks IV. Infectious disease we'll arrange follow-up in the clinic. PICC line being placed. Stop date for Rocephin is 12/11/2015. Patient is asking to be discharged home. She has home health already and multiple caregivers. Once sensitivities are back patient can likely go home. Her vital signs and lab work are rapidly improving. -Admitted to step down -Continue fluid resuscitation, given x2 1L during day and MIVF (7/24) -Continue Rocephin started 7/23-->9/05 -Follow blood cultures AEROCOCCUS SPECIES - Patient to follow-up with Dr. Iran Planas of plastic surgery for her sacral wound per Dr. Linus Salmons -Pt's wound care physician Dr. Jeanette Caprice.   #2. Urinary tract infection-> pyelonephritis. Patient with chronic Foley due to sacral wound with wound VAC. Urinalysis consistent with UTI. Of note admission in May of this year for the same. Discharge summary indicates initially broad-spectrum antibiotics used. -follow urine culture for sensitivities - E. Coli - probiotic ordered  #3. Acute encephalopathy due to sepsis. ABG as noted above. History of same. Of note CT of the head done in May of this year without acute abnormality -Fluid resuscitation -Antibiotics for 6 weeks -better, appears  to be completely resolved  #4. Acute kidney injury likely related to above. ? While.. Creatinine on admission <1.1 -Fluids as noted above -Hold nephrotoxins -Monitor urine output -Recheck in the morning  #5. Hypothyroidism. Home medications include Synthroid. -Obtain a TSH, nl -Continue home meds  #.7 Sacral decubitus ulcer due to decreased mobility secondary to paraplegia. Currently dressing intact. She has wound VAC at home. Husband removed a prior to transport -Wound consult, wound vac placed tomorrow  #8. Hypertension. Fair control in the emergency department. Trending down somewhat at point of admission. Home medications include Lasix, metoprolol, Procardia -Hold home meds for now -Monitor closely -Resume home meds as indicated  #9. Anemia. History of same. Hemoglobin 9.8 on admission. No signs symptoms of active bleeding -Monitor  #10. Right breast cancer. Not currently on therapy. -Outpatient follow-up  #11. Chronic pain related to arthritis paraplegia deconditioning. Home meds include MS Contin and percocet with neurontin and baclofen -Hold meds for now secondary to #2 -Resume as indicated   DVT prophylaxis: scd  Code Status: full  Family Communication: husband at bedside  Disposition Plan: home  Consults called: none  Admission status: inpateint  Consultants:  ID - pending  Procedures:  n/a  Antibiotics:  rocephin (indicate start date, and stop date if known)  HPI/Subjective: Complains of back pain. No dysuria. Denies N/v.  Objective: Vitals:   10/30/15 0848 10/30/15 1214  BP: (!) 111/59 (!) 140/59  Pulse: (!) 112 100  Resp: 15 16  Temp: 98 F (36.7 C) 98.3 F (36.8 C)    Intake/Output Summary (Last 24 hours) at 10/30/15 1426 Last data filed at 10/30/15 1214  Gross per 24 hour  Intake  2178 ml  Output             6600 ml  Net            -4422 ml   Filed Weights   10/28/15 1408 10/29/15 0400 10/30/15 0314  Weight:  91.4 kg (201 lb 8 oz) 92.6 kg (204 lb 2.3 oz) 93.9 kg (207 lb)    Exam:   General:  No diaphoresis, anxious, no acute distress  Cardiovascular: Regular rate and rhythm no murmurs rubs or gallops  Respiratory: Clear to auscultation bilaterally no more breathing  Abdomen: Nondistended bowel sounds normal nontender palpation  Musculoskeletal: Moving all extremities, no deformity, 5 out of 5 strength    Data Reviewed: Basic Metabolic Panel:  Recent Labs Lab 10/28/15 0210 10/28/15 0331 10/28/15 0402 10/29/15 0609  NA 139 138 141 140  K 4.3 4.3 4.4 3.4*  CL 106 104 103 109  CO2 26 27  --  25  GLUCOSE 186* 182* 180* 145*  BUN 17 19 24* 17  CREATININE 1.06* 1.17* 1.20* 1.07*  CALCIUM 9.3 9.0  --  8.0*  MG  --   --   --  1.8   Liver Function Tests:  Recent Labs Lab 10/28/15 0210 10/28/15 0331 10/29/15 0609  AST 24 27 20   ALT 16 16 15   ALKPHOS 92 88 64  BILITOT 0.2* 0.5 0.9  PROT 9.0* 8.9* 7.1  ALBUMIN 3.1* 3.0* 2.2*   No results for input(s): LIPASE, AMYLASE in the last 168 hours. No results for input(s): AMMONIA in the last 168 hours. CBC:  Recent Labs Lab 10/28/15 0210 10/28/15 0331 10/28/15 0402  WBC 16.8* 23.8*  --   NEUTROABS 14.8* 20.9*  --   HGB 10.6* 9.8* 11.2*  HCT 36.4 33.1* 33.0*  MCV 91.0 90.9  --   PLT 406* 358  --    Cardiac Enzymes: No results for input(s): CKTOTAL, CKMB, CKMBINDEX, TROPONINI in the last 168 hours. BNP (last 3 results) No results for input(s): BNP in the last 8760 hours.  ProBNP (last 3 results) No results for input(s): PROBNP in the last 8760 hours.  CBG:  Recent Labs Lab 10/29/15 1301 10/29/15 1601 10/29/15 2141 10/30/15 0349 10/30/15 0840  GLUCAP 161* 129* 130* 121* 129*    Recent Results (from the past 240 hour(s))  Culture, blood (Routine X 2) w Reflex to ID Panel     Status: Abnormal (Preliminary result)   Collection Time: 10/28/15  2:10 AM  Result Value Ref Range Status   Specimen Description BLOOD  LEFT WRIST  Final   Special Requests BOTTLES DRAWN AEROBIC AND ANAEROBIC 5CC   Final   Culture  Setup Time   Final    GRAM NEGATIVE RODS AEROBIC BOTTLE ONLY CRITICAL RESULT CALLED TO, READ BACK BY AND VERIFIED WITH: Tillman Sers PHARMD [id number] 10/29/15 A BROWNING    Culture ESCHERICHIA COLI (A)  Final   Report Status PENDING  Incomplete  Culture, blood (Routine X 2) w Reflex to ID Panel     Status: None (Preliminary result)   Collection Time: 10/28/15  2:10 AM  Result Value Ref Range Status   Specimen Description BLOOD LEFT HAND  Final   Special Requests IN PEDIATRIC BOTTLE 1CC  Final   Culture NO GROWTH 1 DAY  Final   Report Status PENDING  Incomplete  Blood Culture ID Panel (Reflexed)     Status: Abnormal   Collection Time: 10/28/15  2:10 AM  Result Value Ref Range Status  Enterococcus species NOT DETECTED NOT DETECTED Final   Vancomycin resistance NOT DETECTED NOT DETECTED Final   Listeria monocytogenes NOT DETECTED NOT DETECTED Final   Staphylococcus species NOT DETECTED NOT DETECTED Final   Staphylococcus aureus NOT DETECTED NOT DETECTED Final   Methicillin resistance NOT DETECTED NOT DETECTED Final   Streptococcus species NOT DETECTED NOT DETECTED Final   Streptococcus agalactiae NOT DETECTED NOT DETECTED Final   Streptococcus pneumoniae NOT DETECTED NOT DETECTED Final   Streptococcus pyogenes NOT DETECTED NOT DETECTED Final   Acinetobacter baumannii NOT DETECTED NOT DETECTED Final   Enterobacteriaceae species DETECTED (A) NOT DETECTED Final    Comment: CRITICAL RESULT CALLED TO, READ BACK BY AND VERIFIED WITHTillman Sers PHARMD 1933 10/29/15 A BROWNING    Enterobacter cloacae complex NOT DETECTED NOT DETECTED Final   Escherichia coli DETECTED (A) NOT DETECTED Final    Comment: CRITICAL RESULT CALLED TO, READ BACK BY AND VERIFIED WITH: Tillman Sers PHARMD 1933 10/29/15 A BROWNING    Klebsiella oxytoca NOT DETECTED NOT DETECTED Final   Klebsiella pneumoniae NOT DETECTED NOT DETECTED  Final   Proteus species NOT DETECTED NOT DETECTED Final   Serratia marcescens NOT DETECTED NOT DETECTED Final   Carbapenem resistance NOT DETECTED NOT DETECTED Final   Haemophilus influenzae NOT DETECTED NOT DETECTED Final   Neisseria meningitidis NOT DETECTED NOT DETECTED Final   Pseudomonas aeruginosa NOT DETECTED NOT DETECTED Final   Candida albicans NOT DETECTED NOT DETECTED Final   Candida glabrata NOT DETECTED NOT DETECTED Final   Candida krusei NOT DETECTED NOT DETECTED Final   Candida parapsilosis NOT DETECTED NOT DETECTED Final   Candida tropicalis NOT DETECTED NOT DETECTED Final  Urine culture     Status: Abnormal (Preliminary result)   Collection Time: 10/28/15  4:09 AM  Result Value Ref Range Status   Specimen Description URINE, RANDOM  Final   Special Requests NONE  Final   Culture >=100,000 COLONIES/mL ESCHERICHIA COLI (A)  Final   Report Status PENDING  Incomplete  Culture, blood (Routine X 2) w Reflex to ID Panel     Status: Abnormal (Preliminary result)   Collection Time: 10/28/15 10:00 AM  Result Value Ref Range Status   Specimen Description BLOOD LEFT ANTECUBITAL  Final   Special Requests AEROCOCCUS SPECIES  Dix (A)  Final   Culture NO GROWTH 1 DAY  Final   Report Status PENDING  Incomplete  Culture, blood (Routine X 2) w Reflex to ID Panel     Status: None (Preliminary result)   Collection Time: 10/28/15 10:10 AM  Result Value Ref Range Status   Specimen Description BLOOD LEFT HAND  Final   Special Requests IN PEDIATRIC BOTTLE  4CC  Final   Culture NO GROWTH 1 DAY  Final   Report Status PENDING  Incomplete  MRSA PCR Screening     Status: Abnormal   Collection Time: 10/28/15  2:49 PM  Result Value Ref Range Status   MRSA by PCR POSITIVE (A) NEGATIVE Final    Comment:        The GeneXpert MRSA Assay (FDA approved for NASAL specimens only), is one component of a comprehensive MRSA colonization surveillance program. It is not intended to diagnose  MRSA infection nor to guide or monitor treatment for MRSA infections. RESULT CALLED TO, READ BACK BY AND VERIFIED WITH: A.BARLOW,RN 10/28/15 @1809  BY V.WILKINS      Studies: No results found.  Scheduled Meds: . baclofen  20 mg Oral QID  . cefTRIAXone (ROCEPHIN)  IV  2 g Intravenous Q24H  . Chlorhexidine Gluconate Cloth  6 each Topical Q0600  . DULoxetine  60 mg Oral Daily  . enoxaparin (LOVENOX) injection  40 mg Subcutaneous Q24H  . feeding supplement  1 Container Oral Q24H  . feeding supplement (PRO-STAT SUGAR FREE 64)  30 mL Oral BID  . furosemide  40 mg Oral Daily  . gabapentin  800 mg Oral BID  . insulin aspart  0-15 Units Subcutaneous TID WC  . insulin aspart  0-5 Units Subcutaneous QHS  . levothyroxine  125 mcg Oral QPC supper  . metoprolol tartrate  50 mg Oral BID  . morphine  15 mg Oral Q12H  . multivitamin with minerals  1 tablet Oral Daily  . mupirocin ointment  1 application Nasal BID  . NIFEdipine  10 mg Oral Q8H  . pantoprazole  40 mg Oral QHS  . sodium chloride flush  3 mL Intravenous Q12H  . vancomycin  750 mg Intravenous Q12H   Continuous Infusions:    Principal Problem:   Sepsis ([place]) Active Problems:   HTN (hypertension)   Sacral ulcer   Hypothyroidism   Paraplegia ([place])   Osteomyelitis ([facility])   AKI (acute kidney injury) ([place])   Encephalopathy acute   Acute pyelonephritis    Time spent: [place] [facility] Pager [phone]. If 7PM-7AM, please contact night-coverage at [email or web address], password [facility] 10/30/2015, 2:26 PM  LOS: 2 days

## 2015-10-30 NOTE — Progress Notes (Signed)
Pt will need 6 weeks of iv antibiotics. Have alerted pam chandler w [facility] of this. Pt was act w [facility] for hhrn pta. Alerted donna w [facility] of poss dc for today.

## 2015-10-30 NOTE — Consult Note (Addendum)
[place] Nurse wound follow up Wound type: Stage 4 Pressure Injury Measurement: 3.5cm x 6cm x 5cm  Wound [id number], early granulation, pink, moist Drainage (amount, consistency, odor) moderate, non purulent, serosanguinous  Periwound: intact  Dressing procedure/placement/frequency: Skin protected to the left hip with VAC drape. 1pc of black foam used to fill the wound bed, 1pc of black foam used to bridge the wound dressing to the left hip. Seal obtained at 137mmHG.  Patient tolerated well.  If patient to DC to home, ok to clamp dressing and have patient/husband/HHRN hook patient to home VAC once at home.  Trip from hospital to home is 15 minutes, ok to clamp for this amount of time. Dr. Linus Salmons ID at the bedside with [place] nurse to assess wound and explain osteomyelitis dx.   Will need HHRN for VAC dressing changes T/Th/Sat. Will need HHRN for PICC line care.   Will need consultation with plastic surgery as an outpatient.   Husband arrived later, after ID had left.  WOC explained to husband what Dr. Linus Salmons had explained to the patient about dx, need for IV antibiotics and PICC line, possible need for plastic surgery evaluation. Explained the tensile strength of the wound as it heals will not be as strong as the muscle which is the reason for plastic surgery evaluation.   WOC will follow along with you for support with VAC dressings while inpatient.   Bettyjane [facility] RN,CWOCN

## 2015-10-30 NOTE — Progress Notes (Signed)
Peripherally Inserted Central Catheter/Midline Placement  The IV Nurse has discussed with the patient and/or persons authorized to consent for the patient, the purpose of this procedure and the potential benefits and risks involved with this procedure.  The benefits include less needle sticks, lab draws from the catheter and patient may be discharged home with the catheter.  Risks include, but not limited to, infection, bleeding, blood clot (thrombus formation), and puncture of an artery; nerve damage and irregular heat beat.  Alternatives to this procedure were also discussed.  PICC/Midline Placement Documentation        Prakriti Carignan, Nicolette Bang 10/30/2015, 2:32 PM

## 2015-10-30 NOTE — Consult Note (Signed)
[place] [facility]       Reason for Consult: osteomyelitis    Referring Physician: Dr. Aggie Moats  Principal Problem:   Sepsis [facility]) Active Problems:   HTN (hypertension)   Sacral ulcer   Hypothyroidism   Paraplegia ([place])   AKI (acute kidney injury) ([place])   Encephalopathy acute   UTI (lower urinary tract infection)   . baclofen  20 mg Oral QID  . cefTRIAXone (ROCEPHIN)  IV  2 g Intravenous Q24H  . Chlorhexidine Gluconate Cloth  6 each Topical Q0600  . DULoxetine  60 mg Oral Daily  . enoxaparin (LOVENOX) injection  40 mg Subcutaneous Q24H  . feeding supplement  1 Container Oral Q24H  . feeding supplement (PRO-STAT SUGAR FREE 64)  30 mL Oral BID  . furosemide  40 mg Oral Daily  . gabapentin  800 mg Oral BID  . insulin aspart  0-15 Units Subcutaneous TID WC  . insulin aspart  0-5 Units Subcutaneous QHS  . levothyroxine  125 mcg Oral QPC supper  . metoprolol tartrate  50 mg Oral BID  . morphine  15 mg Oral Q12H  . multivitamin with minerals  1 tablet Oral Daily  . mupirocin ointment  1 application Nasal BID  . NIFEdipine  10 mg Oral Q8H  . pantoprazole  40 mg Oral QHS  . sodium chloride flush  3 mL Intravenous Q12H    Recommendations: Vancomycin and ceftriaxone IV for 6 weeks through Sept 4th (pending sensitivities of E coli) Antibiotics per home health protocol picc line She needs follow up with Dr. Iran Planas of plastic surgery We will arrange follow up in our clinic  Assessment: She has new sclerosis in the left inferior pubic ramus and aggressive periosteal reaction consistent with osteomyelitis.  This has never previously been treated as osteomyelitis.  It does probe to bone.  No surrounding erythema, no pus and good granulation tissue otherwise.  Also with E coli pyelonephritis, sensitivities pending.     Antibiotics: ceftriaxone  HPI: [name] is a 65 y.o. female with cervical neuropathy and bedbound status who presented 7/23 with  leaking around foley then became lethergic and brought in with concern for pyelonephritis.  She has had a large sacral decubitus ulcer and had a CT scan of abd/pelvis and noted findings as above.  Otherwise no fever, no chills.  Wants to go home.  Has VAC in place.  Lactate was elevated.  Blood culture and urine culture with E coli.   CT independently reviewed and lytic area noted.    Review of Systems:  Constitutional: negative for fevers and chills Gastrointestinal: negative for diarrhea All other systems reviewed and are negative   Past Medical History:  Diagnosis Date  . Arthritis    "all over" (04/13/2012)  . Cancer of right breast ([place]) 2011  . Cervical neuropathy    PERIPHERAL NEUROPATHY , HAD CERVICAL FUSION  . Difficult intubation    Fastrach #4 LMA then # 7 ETT used in 2006 [facility], cervical laminectomy)  . Hypercholesteremia   . Hypertension   . Hypothyroidism   . Kidney stones 1990's   "twice; passed in hospital on my own" (04/13/2012)  . Kidney stones 08/10/2015  . PONV (postoperative nausea and vomiting)   . Sacral decubitus ulcer 10/2014    Social History  Substance Use Topics  . Smoking status: Former Smoker    Packs/day: 0.50    Years: 50.00    Types: Cigarettes    Quit date:  11/06/2014  . Smokeless tobacco: Never Used  . Alcohol use No    Family History  Problem Relation Age of Onset  . Heart disease Father     Allergies  Allergen Reactions  . Ivp Dye [Iodinated Diagnostic Agents] Other (See Comments)    "hot and sweaty and almost passed out"  . Aspirin Hives  . Sulfa Antibiotics Hives    Physical Exam: Constitutional: in no apparent distress and alert  Vitals:   10/30/15 0533 10/30/15 0848  BP: (!) 133/54 (!) 111/59  Pulse:  (!) 112  Resp:  15  Temp:  98 F (36.7 C)   EYES: anicteric ENMT: no thrush Cardiovascular: Cor RRR Respiratory: CTA B; normal respiratory effort GI: Bowel sounds are normal, liver is not enlarged, spleen is not  enlarged Musculoskeletal: no pedal edema noted Skin: negatives: ulcer 3 x 6 cm, clean, good granulation tissue; seen with [facility] nurse Neuro: does not move lower extremities  Lab Results  Component Value Date   WBC 23.8 (H) 10/28/2015   HGB 11.2 (L) 10/28/2015   HCT 33.0 (L) 10/28/2015   MCV 90.9 10/28/2015   PLT 358 10/28/2015    Lab Results  Component Value Date   CREATININE 1.07 (H) 10/29/2015   BUN 17 10/29/2015   NA 140 10/29/2015   K 3.4 (L) 10/29/2015   CL 109 10/29/2015   CO2 25 10/29/2015    Lab Results  Component Value Date   ALT 15 10/29/2015   AST 20 10/29/2015   ALKPHOS 64 10/29/2015     Microbiology: Recent Results (from the past 240 hour(s))  Culture, blood (Routine X 2) w Reflex to ID Panel     Status: Abnormal (Preliminary result)   Collection Time: 10/28/15  2:10 AM  Result Value Ref Range Status   Specimen Description BLOOD LEFT WRIST  Final   Special Requests BOTTLES DRAWN AEROBIC AND ANAEROBIC 5CC   Final   Culture  Setup Time   Final    GRAM NEGATIVE RODS AEROBIC BOTTLE ONLY CRITICAL RESULT CALLED TO, READ BACK BY AND VERIFIED WITH: Tillman Sers PHARMD [id number] 10/29/15 A BROWNING    Culture ESCHERICHIA COLI (A)  Final   Report Status PENDING  Incomplete  Culture, blood (Routine X 2) w Reflex to ID Panel     Status: None (Preliminary result)   Collection Time: 10/28/15  2:10 AM  Result Value Ref Range Status   Specimen Description BLOOD LEFT HAND  Final   Special Requests IN PEDIATRIC BOTTLE 1CC  Final   Culture NO GROWTH 1 DAY  Final   Report Status PENDING  Incomplete  Blood Culture ID Panel (Reflexed)     Status: Abnormal   Collection Time: 10/28/15  2:10 AM  Result Value Ref Range Status   Enterococcus species NOT DETECTED NOT DETECTED Final   Vancomycin resistance NOT DETECTED NOT DETECTED Final   Listeria monocytogenes NOT DETECTED NOT DETECTED Final   Staphylococcus species NOT DETECTED NOT DETECTED Final   Staphylococcus aureus NOT DETECTED  NOT DETECTED Final   Methicillin resistance NOT DETECTED NOT DETECTED Final   Streptococcus species NOT DETECTED NOT DETECTED Final   Streptococcus agalactiae NOT DETECTED NOT DETECTED Final   Streptococcus pneumoniae NOT DETECTED NOT DETECTED Final   Streptococcus pyogenes NOT DETECTED NOT DETECTED Final   Acinetobacter baumannii NOT DETECTED NOT DETECTED Final   Enterobacteriaceae species DETECTED (A) NOT DETECTED Final    Comment: CRITICAL RESULT CALLED TO, READ BACK BY AND VERIFIED WITH: F WILSON  PHARMD 1933 10/29/15 A BROWNING    Enterobacter cloacae complex NOT DETECTED NOT DETECTED Final   Escherichia coli DETECTED (A) NOT DETECTED Final    Comment: CRITICAL RESULT CALLED TO, READ BACK BY AND VERIFIED WITH: Tillman Sers PHARMD [id number] 10/29/15 A BROWNING    Klebsiella oxytoca NOT DETECTED NOT DETECTED Final   Klebsiella pneumoniae NOT DETECTED NOT DETECTED Final   Proteus species NOT DETECTED NOT DETECTED Final   Serratia marcescens NOT DETECTED NOT DETECTED Final   Carbapenem resistance NOT DETECTED NOT DETECTED Final   Haemophilus influenzae NOT DETECTED NOT DETECTED Final   Neisseria meningitidis NOT DETECTED NOT DETECTED Final   Pseudomonas aeruginosa NOT DETECTED NOT DETECTED Final   Candida albicans NOT DETECTED NOT DETECTED Final   Candida glabrata NOT DETECTED NOT DETECTED Final   Candida krusei NOT DETECTED NOT DETECTED Final   Candida parapsilosis NOT DETECTED NOT DETECTED Final   Candida tropicalis NOT DETECTED NOT DETECTED Final  Urine culture     Status: Abnormal (Preliminary result)   Collection Time: 10/28/15  4:09 AM  Result Value Ref Range Status   Specimen Description URINE, RANDOM  Final   Special Requests NONE  Final   Culture >=100,000 COLONIES/mL ESCHERICHIA COLI (A)  Final   Report Status PENDING  Incomplete  Culture, blood (Routine X 2) w Reflex to ID Panel     Status: Abnormal (Preliminary result)   Collection Time: 10/28/15 10:00 AM  Result Value Ref  Range Status   Specimen Description BLOOD LEFT ANTECUBITAL  Final   Special Requests AEROCOCCUS SPECIES  [place] (A)  Final   Culture NO GROWTH 1 DAY  Final   Report Status PENDING  Incomplete  Culture, blood (Routine X 2) w Reflex to ID Panel     Status: None (Preliminary result)   Collection Time: 10/28/15 10:10 AM  Result Value Ref Range Status   Specimen Description BLOOD LEFT HAND  Final   Special Requests IN PEDIATRIC BOTTLE  4CC  Final   Culture NO GROWTH 1 DAY  Final   Report Status PENDING  Incomplete  MRSA PCR Screening     Status: Abnormal   Collection Time: 10/28/15  2:49 PM  Result Value Ref Range Status   MRSA by PCR POSITIVE (A) NEGATIVE Final    Comment:        The GeneXpert MRSA Assay (FDA approved for NASAL specimens only), is one component of a comprehensive MRSA colonization surveillance program. It is not intended to diagnose MRSA infection nor to guide or monitor treatment for MRSA infections. RESULT CALLED TO, READ BACK BY AND VERIFIED WITH: A.BARLOW,RN 10/28/15 @1809  BY V.WILKINS     Scharlene Gloss, [place] [email or web address] [phone] pager  [phone] cell 10/30/2015, 9:27 AM

## 2015-10-31 DIAGNOSIS — N179 Acute kidney failure, unspecified: Secondary | ICD-10-CM

## 2015-10-31 DIAGNOSIS — M869 Osteomyelitis, unspecified: Secondary | ICD-10-CM

## 2015-10-31 DIAGNOSIS — G9340 Encephalopathy, unspecified: Secondary | ICD-10-CM

## 2015-10-31 DIAGNOSIS — A419 Sepsis, unspecified organism: Principal | ICD-10-CM

## 2015-10-31 DIAGNOSIS — G8220 Paraplegia, unspecified: Secondary | ICD-10-CM

## 2015-10-31 DIAGNOSIS — I10 Essential (primary) hypertension: Secondary | ICD-10-CM

## 2015-10-31 DIAGNOSIS — N10 Acute tubulo-interstitial nephritis: Secondary | ICD-10-CM

## 2015-10-31 LAB — CBC WITH DIFFERENTIAL/PLATELET
BASOS ABS: 0 10*3/uL (ref 0.0–0.1)
BASOS PCT: 0 %
Eosinophils Absolute: 0.1 10*3/uL (ref 0.0–0.7)
Eosinophils Relative: 2 %
HEMATOCRIT: 25.8 % — AB (ref 36.0–46.0)
HEMOGLOBIN: 7.5 g/dL — AB (ref 12.0–15.0)
Lymphocytes Relative: 16 %
Lymphs Abs: 1 10*3/uL (ref 0.7–4.0)
MCH: 25.8 pg — ABNORMAL LOW (ref 26.0–34.0)
MCHC: 29.1 g/dL — ABNORMAL LOW (ref 30.0–36.0)
MCV: 88.7 fL (ref 78.0–100.0)
MONOS PCT: 8 %
Monocytes Absolute: 0.5 10*3/uL (ref 0.1–1.0)
NEUTROS ABS: 4.7 10*3/uL (ref 1.7–7.7)
NEUTROS PCT: 74 %
Platelets: 282 10*3/uL (ref 150–400)
RBC: 2.91 MIL/uL — AB (ref 3.87–5.11)
RDW: 19.3 % — ABNORMAL HIGH (ref 11.5–15.5)
WBC: 6.3 10*3/uL (ref 4.0–10.5)

## 2015-10-31 LAB — GLUCOSE, CAPILLARY
GLUCOSE-CAPILLARY: 121 mg/dL — AB (ref 65–99)
GLUCOSE-CAPILLARY: 130 mg/dL — AB (ref 65–99)
Glucose-Capillary: 112 mg/dL — ABNORMAL HIGH (ref 65–99)

## 2015-10-31 LAB — BASIC METABOLIC PANEL
ANION GAP: 5 (ref 5–15)
BUN: 9 mg/dL (ref 6–20)
CALCIUM: 8.7 mg/dL — AB (ref 8.9–10.3)
CO2: 29 mmol/L (ref 22–32)
Chloride: 104 mmol/L (ref 101–111)
Creatinine, Ser: 0.81 mg/dL (ref 0.44–1.00)
Glucose, Bld: 116 mg/dL — ABNORMAL HIGH (ref 65–99)
Potassium: 3.2 mmol/L — ABNORMAL LOW (ref 3.5–5.1)
Sodium: 138 mmol/L (ref 135–145)

## 2015-10-31 LAB — CULTURE, BLOOD (ROUTINE X 2)

## 2015-10-31 MED ORDER — DEXTROSE 5 % IV SOLN: 2.0000 g | INTRAVENOUS | Status: DC

## 2015-10-31 MED ORDER — VANCOMYCIN HCL IN DEXTROSE 750-5 MG/150ML-% IV SOLN: 750.0000 mg | Freq: Two times a day (BID) | INTRAVENOUS | 0 refills | Status: AC

## 2015-10-31 MED ORDER — DEXTROSE 5 % IV SOLN: 2.0000 g | INTRAVENOUS | 0 refills | Status: AC

## 2015-10-31 MED ORDER — VANCOMYCIN HCL 10 G IV SOLR: 2000.0000 mg | INTRAVENOUS | Status: DC

## 2015-10-31 MED ORDER — VANCOMYCIN HCL 10 G IV SOLR: 2000.0000 mg | INTRAVENOUS | Status: AC

## 2015-10-31 NOTE — Progress Notes (Signed)
Pt discharged home with husband.  Discharge instructions reviewed with patient and husband.  Pt verbalized understanding.  Denies any further questions or concerns.  Pt being discharged with left upper arm PICC. Dressing clean dry intact.

## 2015-10-31 NOTE — Discharge Summary (Signed)
Physician Discharge Summary  [name] [id number] DOB: 03-Nov-1950 DOA: 10/28/2015  PCP: [name] Mariscal, MD  Admit date: 10/28/2015 Discharge date: 10/31/2015  Time spent: 35 minutes  Recommendations for Outpatient Follow-up:  1. Follow-up with primary care physician in one week  2. Follow-up with Dr. Iran [name] plastic surgery in 1-2 weeks  3. Follow-up in infectious diseases clinic with [name] in 2 weeks   Discharge Diagnoses:  Principal Problem:   Sepsis ([place]) Active Problems:   HTN (hypertension)   Sacral ulcer   Hypothyroidism   Paraplegia ([place])   Osteomyelitis ([place])   AKI (acute kidney injury) ([place])   Encephalopathy acute   Acute pyelonephritis   Discharge Condition: Stable  Diet recommendation: Cardiac and diabetic diet  Filed Weights   10/29/15 0400 10/30/15 0314 10/31/15 0500  Weight: 92.6 kg (204 lb 2.3 oz) 93.9 kg (207 lb) 88.5 kg (195 lb)    History of present illness:  [name] is a 65 year old female with history of hypertension, hypothyroidism, bedbound status with a sacral decubitus ulcer with a wound VAC at home, recurrent urinary tract infections, chronic indwelling Foley catheter was brought to the emergency department with acute encephalopathy with the concern of sepsis from urinary tract infection. Patient had elevated white blood cell count of 23,000. CT abdomen and pelvis was consistent with left-sided pyelonephritis with mild hydro-without obstruction. CT also showed new sclerosis of the posterior portion of the left anterior pubic ramus suggestive of periosteal reaction consistent with osteomyelitis.  Hospital Course:  #. Sepsis related to urinary tract infection. On admit WBCs 23, lactic acid 5.3 up from 4.8, urinalysis consistent with UTI, heart rate 117, acute kidney injury, acute encephalopathy. ABG pH 7.40 PCO2 38.4 PO2 60 bicarbonate 24.She received 3 L of normal saline and Rocephin in the emergency department. Second lactic acid trending  up.Urine culture grew Escherichia coli sensitive to Rocephin.   # Osteomyelitis seen on CT scan. Infectious disease consulted, recommending antibiotics 6 weeks IV. Infectious disease with vancomycin and Rocephin.  Last date of antibiotics will be 12/10/2015.Marland Kitchen PICC line being placed.  - Patient to follow-up with Dr. Iran [name] of plastic surgery for her sacral wound per [name] -Pt's wound care physician [name]. Will need to follow-up with wound care after the discharge  #. Urinary tract infection-> pyelonephritis. Patient with chronic Foley due to sacral wound with wound VAC. Urine cultures grew Escherichia coli sensitive to Rocephin  # Acute encephalopathy due to sepsis and multiple sedative medications. Resolved  #. Acute kidney injury from ATN. Given IV fluids with improvement of the kidney function back to baseline. Will need to have a close follow-up with the creatinine while patient is on antibiotics  #. Hypothyroidism. Home medications include Synthroid.  #.Sacral decubitus ulcer due to decreased mobility secondary to paraplegia. Currently dressing intact. She has wound VAC at home. Husband removed a prior to transport  # Hypertension. Held initially blood pressure medication secondary to sepsis. Will be resumed back at the time of the discharge.  #. Anemia. History of same. Hemoglobin 9.8 on admission. No signs symptoms of active bleeding -Monitor  #. Right breast cancer. Not currently on therapy. -Outpatient follow-up  #. Chronic pain related to arthritis paraplegia deconditioning. Continue with home meds  include MS Contin and percocet with neurontin and baclofen -Highly concerning about this is also contemplating to metabolic encephalopathy  Procedures:  CT abdomen and pelvis:  1. Extensive perinephric edema about the left kidney, highly suspicious for pyelonephritis given the history of  UTI. Mild left-sided hydronephrosis. No obstructing renal or ureteral  stone. Bladder decompressed by Foley catheter. 2. Bilateral nephrolithiasis. 3. New sclerosis of the posterior portion of the left inferior pubic ramus with aggressive periosteal reaction, consistent with osteomyelitis, with adjacent soft tissue gas compatible with previously described decubitus ulcer. 4. Fatty infiltration of the liver. 5. Cholelithiasis without evidence of acute cholecystitis. 6. Aortic atherosclerosis. Consultations:  Infectious diseases  Discharge Exam: Vitals:   10/31/15 0734 10/31/15 1206  BP: (!) 149/93 (!) 157/69  Pulse: 85 91  Resp: (!) 21 (!) 22  Temp: 98 F (36.7 C) 98 F (36.7 C)    General: Not in distress Cardiovascular:  S1-S2 regular Respiratory:  Bilateral clear to auscultation Discharge Instructions    Current Discharge Medication List    START taking these medications   Details  cefTRIAXone 2 g in dextrose 5 % 50 mL Inject 2 g into the vein daily. Qty: 40 Dose, Refills: 0    Vancomycin (VANCOCIN) 750-5 MG/150ML-% SOLN Inject 150 mLs (750 mg total) into the vein every 12 (twelve) hours. Qty: 4000 mL, Refills: 0      CONTINUE these medications which have NOT CHANGED   Details  ascorbic acid (VITAMIN C) 500 MG tablet Take 1 tablet (500 mg total) by mouth daily. Qty: 30 tablet, Refills: 12    baclofen (LIORESAL) 20 MG tablet Take 20 mg by mouth See admin instructions. Take 1 tablet (20 mg) by mouth 4 times daily - 7am, 12pm, 4pm, 8pm Refills: 0    DULoxetine (CYMBALTA) 60 MG capsule Take 60 mg by mouth daily after supper.     ferrous sulfate 325 (65 FE) MG tablet Take 325 mg by mouth daily after supper.     folic acid (FOLVITE) 1 MG tablet Take 1 mg by mouth daily after supper.     furosemide (LASIX) 40 MG tablet Take 40 mg by mouth daily after supper.     gabapentin (NEURONTIN) 800 MG tablet Take 1,600 mg by mouth 2 (two) times daily.    levothyroxine (SYNTHROID, LEVOTHROID) 125 MCG tablet Take 1 tablet (125 mcg total) by  mouth daily before breakfast. Qty: 30 tablet, Refills: 0    metoprolol (LOPRESSOR) 100 MG tablet Take 100 mg by mouth daily after supper.    morphine (MS CONTIN) 30 MG 12 hr tablet Take 30 mg by mouth See admin instructions. Take 1 tablet (30 mg) by mouth daily at 10am, 12pm and 8pm Refills: 0    Multiple Vitamin (MULTIVITAMIN WITH MINERALS) TABS tablet Take 1 tablet by mouth daily. Qty: 30 tablet, Refills: 12    NIFEdipine (PROCARDIA XL/ADALAT-CC) 30 MG 24 hr tablet Take 30 mg by mouth daily after supper.     oxyCODONE-acetaminophen (PERCOCET) 10-325 MG per tablet Take 1-2 tablets by mouth See admin instructions. Take 1 tablet by mouth daily at 7am, 2 tablets at 10am and 4pm Refills: 0    pantoprazole (PROTONIX) 40 MG tablet Take 1 tablet (40 mg total) by mouth daily. Qty: 30 tablet, Refills: 0    tolterodine (DETROL LA) 4 MG 24 hr capsule Take 4 mg by mouth daily as needed (bladder spasms).     zinc sulfate 220 MG capsule Take 1 capsule (220 mg total) by mouth daily. Qty: 30 capsule, Refills: 12    alfuzosin (UROXATRAL) 10 MG 24 hr tablet Take 1 tablet (10 mg total) by mouth daily with breakfast. Qty: 30 tablet, Refills: 0       Allergies  Allergen Reactions  .  Ivp Dye [Iodinated Diagnostic Agents] Other (See Comments)    "hot and sweaty and almost passed out"  . Aspirin Hives  . Sulfa Antibiotics Hives   Follow-up Information    [name] Mariscal, MD. Schedule an appointment as soon as possible for a visit in 1 week(s).   Specialty:  Internal Medicine Contact information: [place] [address] [phone]            The results of significant diagnostics from this hospitalization (including imaging, microbiology, ancillary and laboratory) are listed below for reference.    Significant Diagnostic Studies: Ct Abdomen Pelvis Wo Contrast  Result Date: 10/28/2015 CLINICAL DATA:  Right flank pain, urinary tract infection. History of stone, hypertension.  EXAM: CT ABDOMEN AND PELVIS WITHOUT CONTRAST TECHNIQUE: Multidetector CT imaging of the abdomen and pelvis was performed following the standard protocol without IV contrast. COMPARISON:  CT abdomen is dated 08/10/2015 and 11/01/2014. FINDINGS: Lower chest:  No acute findings. Hepatobiliary: Liver slightly low in density throughout suggesting fatty infiltration. Small layering stones within the nondistended gallbladder. No evidence of acute cholecystitis. Pancreas: No mass or inflammatory process identified on this un-enhanced exam. Spleen: Within normal limits in size. Adrenals/Urinary Tract: Mild left-sided hydronephrosis. Prominent perinephric edema on the left which is new compared to the previous study. 2 mm left renal stone. No obstructing renal or ureteral stone. Multiple stones again noted within the right renal pelvis, largest measuring 9 mm, without associated hydronephrosis. No right-sided ureteral stone. Bladder is decompressed by a Foley catheter. Stomach/Bowel: Bowel is normal in caliber. No bowel wall thickening or evidence of bowel wall inflammation seen. Fairly large amount of stool throughout the nondistended colon. Appendix is normal. Stomach appears normal, decompressed. Vascular/Lymphatic: Atherosclerotic changes of the normal caliber abdominal aorta. No enlarged lymph nodes seen. Reproductive: No mass or other significant abnormality. Other: No abscess collection or free intraperitoneal air seen. Musculoskeletal: New sclerotic lesion within the posterior aspect of the left inferior pubic ramus, with adjacent soft tissue air, incompletely imaged. Soft tissue air was identified in this area on earlier CT, described as a probable decubitus ulcer on that report. IMPRESSION: 1. Extensive perinephric edema about the left kidney, highly suspicious for pyelonephritis given the history of UTI. Mild left-sided hydronephrosis. No obstructing renal or ureteral stone. Bladder decompressed by Foley catheter.  2. Bilateral nephrolithiasis. 3. New sclerosis of the posterior portion of the left inferior pubic ramus with aggressive periosteal reaction, consistent with osteomyelitis, with adjacent soft tissue gas compatible with previously described decubitus ulcer. 4. Fatty infiltration of the liver. 5. Cholelithiasis without evidence of acute cholecystitis. 6. Aortic atherosclerosis. These results were called by telephone at the time of interpretation on 10/28/2015 at 11:09 am to Dr. Dyanne Carrel , who verbally acknowledged these results. Electronically Signed   By: Franki Cabot M.D.   On: 10/28/2015 11:10  Dg Chest Port 1 View  Result Date: 10/30/2015 CLINICAL DATA:  PICC line placement. EXAM: PORTABLE CHEST 1 VIEW COMPARISON:  One-view chest x-ray 10/28/2015. FINDINGS: Heart size normal. A left-sided PICC line has been placed. The tip projects slightly more lateral than usual. This is likely related to rotation of the patient. Is in the distal SVC. There is no pneumothorax. Lung volumes are low. Right hilar fullness is stable. No focal airspace disease is present. IMPRESSION: 1. Interval placement of left-sided PICC line. The tip is in the distal SVC. 2. Persistent low lung volumes and interstitial coarsening. Electronically Signed   By: Wynetta Fines.D.  On: 10/30/2015 15:32  Dg Chest Port 1 View  Result Date: 10/28/2015 CLINICAL DATA:  Weakness and tiredness. EXAM: PORTABLE CHEST 1 VIEW COMPARISON:  08/10/2015 FINDINGS: Chronic cardiomegaly and vascular pedicle widening. There is asymmetric prominent convexity of the right mediastinum. There is no edema, consolidation, effusion, or pneumothorax. Low volume chest. No acute osseous finding. IMPRESSION: 1. Chronic cardiomegaly and hypoventilation. 2. Right mediastinal fullness may be technical. Recommend two-view chest x-ray when clinically able. Electronically Signed   By: Monte Fantasia M.D.   On: 10/28/2015 05:10   Microbiology: Recent Results  (from the past 240 hour(s))  Culture, blood (Routine X 2) w Reflex to ID Panel     Status: Abnormal   Collection Time: 10/28/15  2:10 AM  Result Value Ref Range Status   Specimen Description BLOOD LEFT WRIST  Final   Special Requests BOTTLES DRAWN AEROBIC AND ANAEROBIC 5CC   Final   Culture  Setup Time   Final    GRAM NEGATIVE RODS AEROBIC BOTTLE ONLY CRITICAL RESULT CALLED TO, READ BACK BY AND VERIFIED WITH: Tillman Sers PHARMD [id number] 10/29/15 A BROWNING    Culture ESCHERICHIA COLI (A)  Final   Report Status 10/31/2015 FINAL  Final   Organism ID, Bacteria ESCHERICHIA COLI  Final      Susceptibility   Escherichia coli - MIC*    AMPICILLIN <=2 SENSITIVE Sensitive     CEFAZOLIN <=4 SENSITIVE Sensitive     CEFEPIME <=1 SENSITIVE Sensitive     CEFTAZIDIME <=1 SENSITIVE Sensitive     CEFTRIAXONE <=1 SENSITIVE Sensitive     CIPROFLOXACIN <=0.25 SENSITIVE Sensitive     GENTAMICIN <=1 SENSITIVE Sensitive     IMIPENEM <=0.25 SENSITIVE Sensitive     TRIMETH/SULFA <=20 SENSITIVE Sensitive     AMPICILLIN/SULBACTAM <=2 SENSITIVE Sensitive     PIP/TAZO <=4 SENSITIVE Sensitive     * ESCHERICHIA COLI  Culture, blood (Routine X 2) w Reflex to ID Panel     Status: None (Preliminary result)   Collection Time: 10/28/15  2:10 AM  Result Value Ref Range Status   Specimen Description BLOOD LEFT HAND  Final   Special Requests IN PEDIATRIC BOTTLE 1CC  Final   Culture NO GROWTH 2 DAYS  Final   Report Status PENDING  Incomplete  Blood Culture ID Panel (Reflexed)     Status: Abnormal   Collection Time: 10/28/15  2:10 AM  Result Value Ref Range Status   Enterococcus species NOT DETECTED NOT DETECTED Final   Vancomycin resistance NOT DETECTED NOT DETECTED Final   Listeria monocytogenes NOT DETECTED NOT DETECTED Final   Staphylococcus species NOT DETECTED NOT DETECTED Final   Staphylococcus aureus NOT DETECTED NOT DETECTED Final   Methicillin resistance NOT DETECTED NOT DETECTED Final   Streptococcus species  NOT DETECTED NOT DETECTED Final   Streptococcus agalactiae NOT DETECTED NOT DETECTED Final   Streptococcus pneumoniae NOT DETECTED NOT DETECTED Final   Streptococcus pyogenes NOT DETECTED NOT DETECTED Final   Acinetobacter baumannii NOT DETECTED NOT DETECTED Final   Enterobacteriaceae species DETECTED (A) NOT DETECTED Final    Comment: CRITICAL RESULT CALLED TO, READ BACK BY AND VERIFIED WITHTillman Sers PHARMD 1933 10/29/15 A BROWNING    Enterobacter cloacae complex NOT DETECTED NOT DETECTED Final   Escherichia coli DETECTED (A) NOT DETECTED Final    Comment: CRITICAL RESULT CALLED TO, READ BACK BY AND VERIFIED WITHTillman Sers PHARMD 1933 10/29/15 A BROWNING    Klebsiella oxytoca NOT DETECTED NOT DETECTED Final  Klebsiella pneumoniae NOT DETECTED NOT DETECTED Final   Proteus species NOT DETECTED NOT DETECTED Final   Serratia marcescens NOT DETECTED NOT DETECTED Final   Carbapenem resistance NOT DETECTED NOT DETECTED Final   Haemophilus influenzae NOT DETECTED NOT DETECTED Final   Neisseria meningitidis NOT DETECTED NOT DETECTED Final   Pseudomonas aeruginosa NOT DETECTED NOT DETECTED Final   Candida albicans NOT DETECTED NOT DETECTED Final   Candida glabrata NOT DETECTED NOT DETECTED Final   Candida krusei NOT DETECTED NOT DETECTED Final   Candida parapsilosis NOT DETECTED NOT DETECTED Final   Candida tropicalis NOT DETECTED NOT DETECTED Final  Urine culture     Status: Abnormal   Collection Time: 10/28/15  4:09 AM  Result Value Ref Range Status   Specimen Description URINE, RANDOM  Final   Special Requests NONE  Final   Culture >=100,000 COLONIES/mL ESCHERICHIA COLI (A)  Final   Report Status 10/30/2015 FINAL  Final   Organism ID, Bacteria ESCHERICHIA COLI (A)  Final      Susceptibility   Escherichia coli - MIC*    AMPICILLIN <=2 SENSITIVE Sensitive     CEFAZOLIN <=4 SENSITIVE Sensitive     CEFTRIAXONE <=1 SENSITIVE Sensitive     CIPROFLOXACIN <=0.25 SENSITIVE Sensitive      GENTAMICIN <=1 SENSITIVE Sensitive     IMIPENEM <=0.25 SENSITIVE Sensitive     NITROFURANTOIN <=16 SENSITIVE Sensitive     TRIMETH/SULFA <=20 SENSITIVE Sensitive     AMPICILLIN/SULBACTAM <=2 SENSITIVE Sensitive     PIP/TAZO <=4 SENSITIVE Sensitive     * >=100,000 COLONIES/mL ESCHERICHIA COLI  Culture, blood (Routine X 2) w Reflex to ID Panel     Status: Abnormal (Preliminary result)   Collection Time: 10/28/15 10:00 AM  Result Value Ref Range Status   Specimen Description BLOOD LEFT ANTECUBITAL  Final   Special Requests AEROCOCCUS SPECIES  [place] (A)  Final   Culture NO GROWTH 2 DAYS  Final   Report Status PENDING  Incomplete  Culture, blood (Routine X 2) w Reflex to ID Panel     Status: None (Preliminary result)   Collection Time: 10/28/15 10:10 AM  Result Value Ref Range Status   Specimen Description BLOOD LEFT HAND  Final   Special Requests IN PEDIATRIC BOTTLE  4CC  Final   Culture NO GROWTH 2 DAYS  Final   Report Status PENDING  Incomplete  MRSA PCR Screening     Status: Abnormal   Collection Time: 10/28/15  2:49 PM  Result Value Ref Range Status   MRSA by PCR POSITIVE (A) NEGATIVE Final    Comment:        The GeneXpert MRSA Assay (FDA approved for NASAL specimens only), is one component of a comprehensive MRSA colonization surveillance program. It is not intended to diagnose MRSA infection nor to guide or monitor treatment for MRSA infections. RESULT CALLED TO, READ BACK BY AND VERIFIED WITH: A.BARLOW,RN 10/28/15 @1809  BY V.WILKINS      Labs: Basic Metabolic Panel:  Recent Labs Lab 10/28/15 0210 10/28/15 0331 10/28/15 0402 10/29/15 0609 10/30/15 1600 10/31/15 0430  NA 139 138 141 140 141 138  K 4.3 4.3 4.4 3.4* 3.1* 3.2*  CL 106 104 103 109 107 104  CO2 26 27  --  25 28 29   GLUCOSE 186* 182* 180* 145* 90 116*  BUN 17 19 24* 17 8 9   CREATININE 1.06* 1.17* 1.20* 1.07* 0.78 0.81  CALCIUM 9.3 9.0  --  8.0* 8.5* 8.7*  MG  --   --   --  1.8 1.8  --   PHOS  --    --   --   --  1.6*  --    Liver Function Tests:  Recent Labs Lab 10/28/15 0210 10/28/15 0331 10/29/15 0609 10/30/15 1600  AST 24 27 20   --   ALT 16 16 15   --   ALKPHOS 92 88 64  --   BILITOT 0.2* 0.5 0.9  --   PROT 9.0* 8.9* 7.1  --   ALBUMIN 3.1* 3.0* 2.2* 2.3*   No results for input(s): LIPASE, AMYLASE in the last 168 hours. No results for input(s): AMMONIA in the last 168 hours. CBC:  Recent Labs Lab 10/28/15 0210 10/28/15 0331 10/28/15 0402 10/30/15 1600 10/31/15 0430  WBC 16.8* 23.8*  --  5.1 6.3  NEUTROABS 14.8* 20.9*  --  3.6 4.7  HGB 10.6* 9.8* 11.2* 7.5* 7.5*  HCT 36.4 33.1* 33.0* 26.4* 25.8*  MCV 91.0 90.9  --  90.1 88.7  PLT 406* 358  --  301 282   Cardiac Enzymes: No results for input(s): CKTOTAL, CKMB, CKMBINDEX, TROPONINI in the last 168 hours. BNP: BNP (last 3 results) No results for input(s): BNP in the last 8760 hours.  ProBNP (last 3 results) No results for input(s): PROBNP in the last 8760 hours.  CBG:  Recent Labs Lab 10/30/15 0349 10/30/15 0840 10/30/15 1205 10/30/15 2129 10/31/15 0732  GLUCAP 121* 129* 138* 112* 121*       Signed:  Trevontae Lindahl MD.  [facility] 10/31/2015, 3:21 PM

## 2015-10-31 NOTE — Progress Notes (Signed)
Have alerted donna w [facility] that pt for dc home today. ahc just needs prescription for iv antibioitc.

## 2015-11-02 LAB — CULTURE, BLOOD (ROUTINE X 2)
CULTURE: NO GROWTH
CULTURE: NO GROWTH
Culture: NO GROWTH

## 2015-11-28 ENCOUNTER — Encounter (HOSPITAL_BASED_OUTPATIENT_CLINIC_OR_DEPARTMENT_OTHER): Payer: Medicare Other | Attending: Surgery

## 2015-11-28 DIAGNOSIS — G894 Chronic pain syndrome: Secondary | ICD-10-CM | POA: Diagnosis not present

## 2015-11-28 DIAGNOSIS — D649 Anemia, unspecified: Secondary | ICD-10-CM | POA: Diagnosis not present

## 2015-11-28 DIAGNOSIS — M8668 Other chronic osteomyelitis, other site: Secondary | ICD-10-CM | POA: Insufficient documentation

## 2015-11-28 DIAGNOSIS — L89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, stage 4: Secondary | ICD-10-CM | POA: Insufficient documentation

## 2015-11-28 DIAGNOSIS — I10 Essential (primary) hypertension: Secondary | ICD-10-CM | POA: Diagnosis not present

## 2015-11-28 DIAGNOSIS — L89892 Pressure ulcer of other site, stage 2: Secondary | ICD-10-CM | POA: Insufficient documentation

## 2015-11-28 DIAGNOSIS — G8221 Paraplegia, complete: Secondary | ICD-10-CM | POA: Insufficient documentation

## 2015-11-28 DIAGNOSIS — Z87891 Personal history of nicotine dependence: Secondary | ICD-10-CM | POA: Insufficient documentation

## 2015-11-28 DIAGNOSIS — F0390 Unspecified dementia without behavioral disturbance: Secondary | ICD-10-CM | POA: Insufficient documentation

## 2015-11-28 DIAGNOSIS — E039 Hypothyroidism, unspecified: Secondary | ICD-10-CM | POA: Insufficient documentation

## 2015-11-28 DIAGNOSIS — Z923 Personal history of irradiation: Secondary | ICD-10-CM | POA: Insufficient documentation

## 2015-11-28 DIAGNOSIS — Z853 Personal history of malignant neoplasm of breast: Secondary | ICD-10-CM | POA: Insufficient documentation

## 2015-12-03 ENCOUNTER — Ambulatory Visit (INDEPENDENT_AMBULATORY_CARE_PROVIDER_SITE_OTHER): Payer: Medicare Other | Admitting: Internal Medicine

## 2015-12-03 ENCOUNTER — Encounter: Payer: Self-pay | Admitting: Internal Medicine

## 2015-12-03 DIAGNOSIS — Z5181 Encounter for therapeutic drug level monitoring: Secondary | ICD-10-CM | POA: Diagnosis not present

## 2015-12-03 DIAGNOSIS — M8610 Other acute osteomyelitis, unspecified site: Secondary | ICD-10-CM | POA: Diagnosis not present

## 2015-12-03 DIAGNOSIS — N179 Acute kidney failure, unspecified: Secondary | ICD-10-CM | POA: Diagnosis not present

## 2015-12-03 DIAGNOSIS — L89154 Pressure ulcer of sacral region, stage 4: Secondary | ICD-10-CM

## 2015-12-03 NOTE — Assessment & Plan Note (Signed)
Vancomycin levels have been good, creat wnl.

## 2015-12-03 NOTE — Assessment & Plan Note (Signed)
Improving and inflammatory markers much better.  No new concerns.  Will complete the 6 weeks and stop, [facility] to pull picc line.

## 2015-12-03 NOTE — Assessment & Plan Note (Addendum)
Continued management per Dr. Con Memos.  Decreasing in size per her report (unable to assess due to difficulty moving her).  I encouraged her to consider hyperbaric therapy and his other recommendations.

## 2015-12-03 NOTE — Progress Notes (Signed)
   Subjective:    Patient [name], female    DOB: 25-Feb-1951, 65 y.o.   MRN: [id number]  HPI Here for hsfu. [name] is a 65 y.o. female with cervical neuropathy and bedbound status who presented 7/23 with leaking around foley then became lethergic and brought in with concern for pyelonephritis.  She also has had a large sacral decubitus ulcer and had a CT scan of abd/pelvis and noted findings of new sclerosis with aggressive periosteal reaction c/w osteomyelitis.  Had not previously been treated.  Has been getting wound care with Dr. Con Memos and discussing doing hyperbaric treatments.   Initial CRP of 21.6 and ESR 59, now down to 3.3 and 23, respectively.  She says the surrounding tissue has been good and is getting intermittent VAC placement on it.  Size has reportedly decreased.  No issues with picc line, vancomycin trough has been therapeutic.      Review of Systems  Constitutional: Negative for chills, fatigue and fever.  Gastrointestinal: Negative for diarrhea.  Skin: Negative for rash.  Neurological: Negative for dizziness.       Objective:   Physical Exam  Constitutional: She appears well-developed and well-nourished.  Eyes: No scleral icterus.  Cardiovascular: Normal rate, regular rhythm and normal heart sounds.   Musculoskeletal:  picc line clean, dry, no erythema  Skin: No rash noted.   Social History   Social History  . Marital status: Married    Spouse name: N/A  . Number of children: N/A  . Years of education: N/A   Occupational History  . Not on file.   Social History Main Topics  . Smoking status: Former Smoker    Packs/day: 0.50    Years: 50.00    Types: Cigarettes    Quit date: 11/06/2014  . Smokeless tobacco: Never Used  . Alcohol use No  . Drug use: No  . Sexual activity: Not Currently   Other Topics Concern  . Not on file   Social History Narrative  . No narrative on file         Assessment & Plan:

## 2015-12-03 NOTE — Assessment & Plan Note (Signed)
Last creat 0.85.

## 2015-12-05 ENCOUNTER — Telehealth: Payer: Self-pay | Admitting: *Deleted

## 2015-12-05 NOTE — Telephone Encounter (Signed)
Received message in Triage from [facility] asking to confirm stop date, pull PICC orders and asking if labs would be required before PICC is pulled. Per chart review and verbal order from Dr. Linus Salmons, patient is to stop IV antibiotics 9/4 and have PICC pulled.  No labs are needed at that time. RN relayed verbal order to Lenna Sciara at [facility]. Landis Gandy, RN

## 2015-12-12 ENCOUNTER — Other Ambulatory Visit: Payer: Self-pay | Admitting: Surgery

## 2015-12-12 ENCOUNTER — Encounter (HOSPITAL_BASED_OUTPATIENT_CLINIC_OR_DEPARTMENT_OTHER): Payer: Medicare Other | Attending: Surgery

## 2015-12-12 ENCOUNTER — Ambulatory Visit (HOSPITAL_COMMUNITY)
Admission: RE | Admit: 2015-12-12 | Discharge: 2015-12-12 | Disposition: A | Discharge status: Home / Self Care | Payer: Medicare Other | Source: Ambulatory Visit | Attending: Surgery | Admitting: Surgery

## 2015-12-12 DIAGNOSIS — M8588 Other specified disorders of bone density and structure, other site: Secondary | ICD-10-CM | POA: Insufficient documentation

## 2015-12-12 DIAGNOSIS — Z923 Personal history of irradiation: Secondary | ICD-10-CM | POA: Insufficient documentation

## 2015-12-12 DIAGNOSIS — M8668 Other chronic osteomyelitis, other site: Secondary | ICD-10-CM | POA: Diagnosis not present

## 2015-12-12 DIAGNOSIS — L89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, stage 4: Secondary | ICD-10-CM

## 2015-12-12 DIAGNOSIS — I10 Essential (primary) hypertension: Secondary | ICD-10-CM | POA: Diagnosis not present

## 2015-12-12 DIAGNOSIS — G8221 Paraplegia, complete: Secondary | ICD-10-CM | POA: Diagnosis not present

## 2015-12-12 DIAGNOSIS — M1990 Unspecified osteoarthritis, unspecified site: Secondary | ICD-10-CM | POA: Insufficient documentation

## 2015-12-12 DIAGNOSIS — F0390 Unspecified dementia without behavioral disturbance: Secondary | ICD-10-CM | POA: Insufficient documentation

## 2015-12-19 DIAGNOSIS — L89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, stage 4: Secondary | ICD-10-CM | POA: Diagnosis not present

## 2015-12-20 ENCOUNTER — Encounter: Payer: Self-pay | Admitting: Internal Medicine

## 2015-12-20 DIAGNOSIS — L89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, stage 4: Secondary | ICD-10-CM | POA: Diagnosis not present

## 2015-12-21 DIAGNOSIS — L89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, stage 4: Secondary | ICD-10-CM | POA: Diagnosis not present

## 2015-12-24 DIAGNOSIS — L89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, stage 4: Secondary | ICD-10-CM | POA: Diagnosis not present

## 2015-12-25 DIAGNOSIS — L89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, stage 4: Secondary | ICD-10-CM | POA: Diagnosis not present

## 2015-12-26 DIAGNOSIS — L89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, stage 4: Secondary | ICD-10-CM | POA: Diagnosis not present

## 2015-12-27 DIAGNOSIS — L89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, stage 4: Secondary | ICD-10-CM | POA: Diagnosis not present

## 2015-12-28 DIAGNOSIS — L89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, stage 4: Secondary | ICD-10-CM | POA: Diagnosis not present

## 2015-12-31 DIAGNOSIS — L89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, stage 4: Secondary | ICD-10-CM | POA: Diagnosis not present

## 2016-01-01 ENCOUNTER — Encounter: Payer: Self-pay | Admitting: Internal Medicine

## 2016-01-01 DIAGNOSIS — L89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, stage 4: Secondary | ICD-10-CM | POA: Diagnosis not present

## 2016-01-02 DIAGNOSIS — L89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, stage 4: Secondary | ICD-10-CM | POA: Diagnosis not present

## 2016-01-03 DIAGNOSIS — L89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, stage 4: Secondary | ICD-10-CM | POA: Diagnosis not present

## 2016-01-04 DIAGNOSIS — L89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, stage 4: Secondary | ICD-10-CM | POA: Diagnosis not present

## 2016-01-07 ENCOUNTER — Encounter (HOSPITAL_BASED_OUTPATIENT_CLINIC_OR_DEPARTMENT_OTHER): Payer: Medicare Other | Attending: Internal Medicine

## 2016-01-07 DIAGNOSIS — G8221 Paraplegia, complete: Secondary | ICD-10-CM | POA: Diagnosis not present

## 2016-01-07 DIAGNOSIS — F0390 Unspecified dementia without behavioral disturbance: Secondary | ICD-10-CM | POA: Diagnosis not present

## 2016-01-07 DIAGNOSIS — Z853 Personal history of malignant neoplasm of breast: Secondary | ICD-10-CM | POA: Insufficient documentation

## 2016-01-07 DIAGNOSIS — S91202A Unspecified open wound of left great toe with damage to nail, initial encounter: Secondary | ICD-10-CM | POA: Diagnosis not present

## 2016-01-07 DIAGNOSIS — M8668 Other chronic osteomyelitis, other site: Secondary | ICD-10-CM | POA: Insufficient documentation

## 2016-01-07 DIAGNOSIS — Z923 Personal history of irradiation: Secondary | ICD-10-CM | POA: Diagnosis not present

## 2016-01-07 DIAGNOSIS — G894 Chronic pain syndrome: Secondary | ICD-10-CM | POA: Diagnosis not present

## 2016-01-07 DIAGNOSIS — E039 Hypothyroidism, unspecified: Secondary | ICD-10-CM | POA: Diagnosis not present

## 2016-01-07 DIAGNOSIS — L89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, stage 4: Secondary | ICD-10-CM | POA: Diagnosis not present

## 2016-01-07 DIAGNOSIS — X58XXXA Exposure to other specified factors, initial encounter: Secondary | ICD-10-CM | POA: Diagnosis not present

## 2016-01-07 DIAGNOSIS — I10 Essential (primary) hypertension: Secondary | ICD-10-CM | POA: Diagnosis not present

## 2016-01-08 DIAGNOSIS — M8668 Other chronic osteomyelitis, other site: Secondary | ICD-10-CM | POA: Diagnosis not present

## 2016-01-09 DIAGNOSIS — M8668 Other chronic osteomyelitis, other site: Secondary | ICD-10-CM | POA: Diagnosis not present

## 2016-01-10 DIAGNOSIS — M8668 Other chronic osteomyelitis, other site: Secondary | ICD-10-CM | POA: Diagnosis not present

## 2016-01-14 DIAGNOSIS — M8668 Other chronic osteomyelitis, other site: Secondary | ICD-10-CM | POA: Diagnosis not present

## 2016-01-15 DIAGNOSIS — M8668 Other chronic osteomyelitis, other site: Secondary | ICD-10-CM | POA: Diagnosis not present

## 2016-01-16 DIAGNOSIS — M8668 Other chronic osteomyelitis, other site: Secondary | ICD-10-CM | POA: Diagnosis not present

## 2016-01-17 DIAGNOSIS — M8668 Other chronic osteomyelitis, other site: Secondary | ICD-10-CM | POA: Diagnosis not present

## 2016-01-18 DIAGNOSIS — M8668 Other chronic osteomyelitis, other site: Secondary | ICD-10-CM | POA: Diagnosis not present

## 2016-01-21 DIAGNOSIS — M8668 Other chronic osteomyelitis, other site: Secondary | ICD-10-CM | POA: Diagnosis not present

## 2016-01-23 DIAGNOSIS — M8668 Other chronic osteomyelitis, other site: Secondary | ICD-10-CM | POA: Diagnosis not present

## 2016-01-24 DIAGNOSIS — M8668 Other chronic osteomyelitis, other site: Secondary | ICD-10-CM | POA: Diagnosis not present

## 2016-01-25 DIAGNOSIS — M8668 Other chronic osteomyelitis, other site: Secondary | ICD-10-CM | POA: Diagnosis not present

## 2016-01-28 DIAGNOSIS — M8668 Other chronic osteomyelitis, other site: Secondary | ICD-10-CM | POA: Diagnosis not present

## 2016-01-31 DIAGNOSIS — M8668 Other chronic osteomyelitis, other site: Secondary | ICD-10-CM | POA: Diagnosis not present

## 2016-02-01 DIAGNOSIS — M8668 Other chronic osteomyelitis, other site: Secondary | ICD-10-CM | POA: Diagnosis not present

## 2016-02-04 DIAGNOSIS — M8668 Other chronic osteomyelitis, other site: Secondary | ICD-10-CM | POA: Diagnosis not present

## 2016-02-05 DIAGNOSIS — M8668 Other chronic osteomyelitis, other site: Secondary | ICD-10-CM | POA: Diagnosis not present

## 2016-02-06 ENCOUNTER — Encounter (HOSPITAL_BASED_OUTPATIENT_CLINIC_OR_DEPARTMENT_OTHER): Payer: Medicare Other | Attending: Surgery

## 2016-02-06 DIAGNOSIS — I10 Essential (primary) hypertension: Secondary | ICD-10-CM | POA: Insufficient documentation

## 2016-02-06 DIAGNOSIS — L89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, stage 4: Secondary | ICD-10-CM | POA: Diagnosis not present

## 2016-02-06 DIAGNOSIS — M8668 Other chronic osteomyelitis, other site: Secondary | ICD-10-CM | POA: Insufficient documentation

## 2016-02-06 DIAGNOSIS — Z87891 Personal history of nicotine dependence: Secondary | ICD-10-CM | POA: Diagnosis not present

## 2016-02-06 DIAGNOSIS — G894 Chronic pain syndrome: Secondary | ICD-10-CM | POA: Insufficient documentation

## 2016-02-06 DIAGNOSIS — G8221 Paraplegia, complete: Secondary | ICD-10-CM | POA: Diagnosis not present

## 2016-02-06 DIAGNOSIS — Z923 Personal history of irradiation: Secondary | ICD-10-CM | POA: Diagnosis not present

## 2016-02-06 DIAGNOSIS — Z853 Personal history of malignant neoplasm of breast: Secondary | ICD-10-CM | POA: Insufficient documentation

## 2016-02-06 DIAGNOSIS — E039 Hypothyroidism, unspecified: Secondary | ICD-10-CM | POA: Insufficient documentation

## 2016-02-06 DIAGNOSIS — F0390 Unspecified dementia without behavioral disturbance: Secondary | ICD-10-CM | POA: Diagnosis not present

## 2016-02-08 DIAGNOSIS — M8668 Other chronic osteomyelitis, other site: Secondary | ICD-10-CM | POA: Diagnosis not present

## 2016-02-11 DIAGNOSIS — M8668 Other chronic osteomyelitis, other site: Secondary | ICD-10-CM | POA: Diagnosis not present

## 2016-02-12 DIAGNOSIS — M8668 Other chronic osteomyelitis, other site: Secondary | ICD-10-CM | POA: Diagnosis not present

## 2016-02-13 DIAGNOSIS — M8668 Other chronic osteomyelitis, other site: Secondary | ICD-10-CM | POA: Diagnosis not present

## 2016-02-14 DIAGNOSIS — M8668 Other chronic osteomyelitis, other site: Secondary | ICD-10-CM | POA: Diagnosis not present

## 2016-02-15 DIAGNOSIS — M8668 Other chronic osteomyelitis, other site: Secondary | ICD-10-CM | POA: Diagnosis not present

## 2016-02-19 DIAGNOSIS — M8668 Other chronic osteomyelitis, other site: Secondary | ICD-10-CM | POA: Diagnosis not present

## 2016-02-20 DIAGNOSIS — M8668 Other chronic osteomyelitis, other site: Secondary | ICD-10-CM | POA: Diagnosis not present

## 2016-03-05 DIAGNOSIS — M8668 Other chronic osteomyelitis, other site: Secondary | ICD-10-CM | POA: Diagnosis not present

## 2016-03-19 ENCOUNTER — Encounter (HOSPITAL_BASED_OUTPATIENT_CLINIC_OR_DEPARTMENT_OTHER): Payer: Medicare Other | Attending: Surgery

## 2016-03-19 DIAGNOSIS — L89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, stage 4: Secondary | ICD-10-CM | POA: Diagnosis not present

## 2016-03-19 DIAGNOSIS — G8220 Paraplegia, unspecified: Secondary | ICD-10-CM | POA: Insufficient documentation

## 2016-03-19 DIAGNOSIS — Z923 Personal history of irradiation: Secondary | ICD-10-CM | POA: Insufficient documentation

## 2016-03-19 DIAGNOSIS — I10 Essential (primary) hypertension: Secondary | ICD-10-CM | POA: Insufficient documentation

## 2016-03-19 DIAGNOSIS — F0390 Unspecified dementia without behavioral disturbance: Secondary | ICD-10-CM | POA: Diagnosis not present

## 2016-04-09 ENCOUNTER — Encounter (HOSPITAL_BASED_OUTPATIENT_CLINIC_OR_DEPARTMENT_OTHER): Payer: Medicare Other | Attending: Surgery

## 2016-04-09 DIAGNOSIS — I10 Essential (primary) hypertension: Secondary | ICD-10-CM | POA: Insufficient documentation

## 2016-04-09 DIAGNOSIS — L89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, stage 4: Secondary | ICD-10-CM | POA: Insufficient documentation

## 2016-04-09 DIAGNOSIS — Z923 Personal history of irradiation: Secondary | ICD-10-CM | POA: Insufficient documentation

## 2016-04-09 DIAGNOSIS — G8221 Paraplegia, complete: Secondary | ICD-10-CM | POA: Insufficient documentation

## 2016-04-09 DIAGNOSIS — F0390 Unspecified dementia without behavioral disturbance: Secondary | ICD-10-CM | POA: Insufficient documentation

## 2016-04-09 DIAGNOSIS — E119 Type 2 diabetes mellitus without complications: Secondary | ICD-10-CM | POA: Insufficient documentation

## 2016-04-11 NOTE — H&P (Signed)
Subjective:     Patient [name] is a 66 y.o. female.  HPI  Referred by Dr. Con Memos for evaluation. Patient known to me from debridement multiple necrotic pressure ulcers 10/2014 in setting of paraplegia from spinal stenosis. She has been under care [facility] since that time, and I have not seen patient since her hospitalization in 2016. Major events since that time include placement SP catheter for treatment neurogenic bladder. Accompanied by husband today, arrives in [facility] and husband completes the transfer to exam bed. Current VAC, has [facility]. Referral note asks for consideration Integra or Acell and debridement wound.  Consulted with Dr. Morley Kos 02/2016 at [facility]. Per patient she discussed the post op restrictions post flap surgery and she does not feel she could comply with these.   No recent labs available. Treated 2016 for osteomyelitis. Has LAL mattress at home. States she is on protein supplement. Completed HBO course.  Review of Systems     Objective:   Physical Exam  Constitutional: She is oriented to person, place, and time.  Cardiovascular: Normal rate, regular rhythm and normal heart sounds.   Pulmonary/Chest: Effort normal and breath sounds normal.  Neurological: She is alert and oriented to person, place, and time.  Left ischium with 4 x 3 x 4 cm wound, 100% granulation without slough, base of wound hard consistent with bone     Assessment:     Left ischial ulcer stage 4.    Plan:     Chronic pressure ulcer. Patient has declined flap surgery- we discussed this some today and discussed 80% change re ulceration at 1 year in flap patients.  With regards to wound, very clean and does not require debridement. Reviewed options to stimulate wound healing such as A Cell. Reviewed pig bladder source, may need reapplication. Counseled this is not same as skin graft and will still have open wound.As she is asensate in area, plan to do under  MAC, OP procedure. Counseled cannot assure her this will change wound but she has stalled with regards to healing and failed HBO- we agreed to try few applications of A Cell and review progress.   Reviewed the [facility] could offer additional similar products such as Oasis.      Irene Limbo, MD [facility] [phone], pin [phone]

## 2016-04-18 ENCOUNTER — Encounter (HOSPITAL_COMMUNITY): Payer: Self-pay | Admitting: Vascular Surgery

## 2016-04-18 ENCOUNTER — Encounter (HOSPITAL_COMMUNITY)
Admission: RE | Admit: 2016-04-18 | Discharge: 2016-04-18 | Disposition: A | Discharge status: Home / Self Care | Payer: Medicare Other | Source: Ambulatory Visit | Attending: Plastic Surgery | Admitting: Plastic Surgery

## 2016-04-18 ENCOUNTER — Encounter (HOSPITAL_COMMUNITY): Payer: Self-pay

## 2016-04-18 DIAGNOSIS — I10 Essential (primary) hypertension: Secondary | ICD-10-CM | POA: Insufficient documentation

## 2016-04-18 DIAGNOSIS — K219 Gastro-esophageal reflux disease without esophagitis: Secondary | ICD-10-CM | POA: Diagnosis not present

## 2016-04-18 DIAGNOSIS — Z87891 Personal history of nicotine dependence: Secondary | ICD-10-CM | POA: Insufficient documentation

## 2016-04-18 DIAGNOSIS — E785 Hyperlipidemia, unspecified: Secondary | ICD-10-CM | POA: Insufficient documentation

## 2016-04-18 DIAGNOSIS — E039 Hypothyroidism, unspecified: Secondary | ICD-10-CM | POA: Insufficient documentation

## 2016-04-18 DIAGNOSIS — Z01818 Encounter for other preprocedural examination: Secondary | ICD-10-CM | POA: Insufficient documentation

## 2016-04-18 HISTORY — DX: Gastro-esophageal reflux disease without esophagitis: K21.9

## 2016-04-18 HISTORY — DX: Paraplegia, unspecified: G82.20

## 2016-04-18 HISTORY — DX: Anemia, unspecified: D64.9

## 2016-04-18 HISTORY — DX: Personal history of urinary calculi: Z87.442

## 2016-04-18 LAB — CBC WITH DIFFERENTIAL/PLATELET
BASOS ABS: 0 10*3/uL (ref 0.0–0.1)
BASOS PCT: 0 %
EOS ABS: 0.1 10*3/uL (ref 0.0–0.7)
Eosinophils Relative: 2 %
HEMATOCRIT: 38.8 % (ref 36.0–46.0)
HEMOGLOBIN: 12.9 g/dL (ref 12.0–15.0)
Lymphocytes Relative: 24 %
Lymphs Abs: 1.3 10*3/uL (ref 0.7–4.0)
MCH: 30.9 pg (ref 26.0–34.0)
MCHC: 33.2 g/dL (ref 30.0–36.0)
MCV: 92.8 fL (ref 78.0–100.0)
MONOS PCT: 7 %
Monocytes Absolute: 0.4 10*3/uL (ref 0.1–1.0)
NEUTROS ABS: 3.4 10*3/uL (ref 1.7–7.7)
NEUTROS PCT: 67 %
Platelets: 307 10*3/uL (ref 150–400)
RBC: 4.18 MIL/uL (ref 3.87–5.11)
RDW: 15.5 % (ref 11.5–15.5)
WBC: 5.2 10*3/uL (ref 4.0–10.5)

## 2016-04-18 LAB — BASIC METABOLIC PANEL
Anion gap: 9 (ref 5–15)
BUN: 14 mg/dL (ref 6–20)
CALCIUM: 9.3 mg/dL (ref 8.9–10.3)
CO2: 25 mmol/L (ref 22–32)
CREATININE: 1.01 mg/dL — AB (ref 0.44–1.00)
Chloride: 97 mmol/L — ABNORMAL LOW (ref 101–111)
GFR, EST NON AFRICAN AMERICAN: 56 mL/min — AB (ref >60)
Glucose, Bld: 458 mg/dL — ABNORMAL HIGH (ref 65–99)
Potassium: 4.6 mmol/L (ref 3.5–5.1)
SODIUM: 131 mmol/L — AB (ref 135–145)

## 2016-04-18 LAB — PREALBUMIN: PREALBUMIN: 26.3 mg/dL (ref 18–38)

## 2016-04-18 LAB — SURGICAL PCR SCREEN
MRSA, PCR: NEGATIVE
STAPHYLOCOCCUS AUREUS: NEGATIVE

## 2016-04-18 NOTE — Progress Notes (Addendum)
Anesthesia Chart Review:  Pt is a 66 year old female scheduled for surgical prep for grafting and application of a cell L buttock on 04/21/2016 with Irene Limbo, MD.   - PCP is [name], MD  PMH includes:  HTN, hyperlipidemia, hypothyroidism, anemia, post-op N/V, GERD. Former smoker. BMI 34.5. S/p irrigation sacral ulcer 11/03/14. S/p revision L TKA 04/13/12.   Medications include: iron, lasix, levothyroxine, metoprolol, nifedipine, protonix  EKG 10/28/15: Sinus tachycardia (116 bpm). Minimal voltage criteria for LVH, may be normal variant. Nonspecific T wave abnormality  Preoperative labs reviewed.   - Glucose 458.  - Pt does not have a history of DM. Prior glucose results from 4-5 months ago range 90-149.  We added on a HgbA1c to her labs.   I spoke with Dr. Iran Planas. HgbA1c lab results typically are reported the next day, but I am not sure if that holds true on weekends.  If A1c results tomorrow (Saturday 04/19/16) and is greater than 7.5, Dr. Iran Planas should be paged so surgery can be cancelled and pt can initiate DM treatment with her PCP.  If the A1c is less than 7.5, Dr. Iran Planas wishes to proceed as scheduled.  If the A1c is not resulted over the weekend, pt's blood glucose will need to be rechecked 04/21/16 DOS and decision to proceed or not made at that time.   Willeen Cass, FNP-BC [facility]/Anesthesiology Phone: [phone] 04/18/2016 4:50 PM

## 2016-04-18 NOTE — Progress Notes (Addendum)
Glucose results noted- called to Loletta Specter, FNP . Lab called and states they can add A1c Order placed for A1c

## 2016-04-18 NOTE — Pre-Procedure Instructions (Signed)
[name]  04/18/2016      [id number] [place], [place] - [address] [place] [address] Phone: [phone] Fax: [phone]    Your procedure is scheduled on Jan 15  Report to [place] at 530 A.M.  Call this number if you have problems the morning of surgery:  [phone]   Remember:  Do not eat food or drink liquids after midnight.  Take these medicines the morning of surgery with A SIP OF WATER Baclofen(lioresal), Gabapentin (Neurontin), Metoprolol (Lopressor),  Oxycodone (Percocet), Pantoprazole (Protonix),   Stop taking Asprin, BC's, Goody's, Herbal medications, Fish Oil,Ibuprofen, Advil, Motrin, Aleve   Do not wear jewelry, make-up or nail polish.  Do not wear lotions, powders, or perfumes, or deoderant.  Do not shave 48 hours prior to surgery.  Men may shave face and neck.  Do not bring valuables to the hospital.  [facility] is not responsible for any belongings or valuables.  Contacts, dentures or bridgework may not be worn into surgery.  Leave your suitcase in the car.  After surgery it may be brought to your room.  For patients admitted to the hospital, discharge time will be determined by your treatment team.  Patients discharged the day of surgery will not be allowed to drive home.   Special instructions:  [place] - Preparing for Surgery  Before surgery, you can play an important role.  Because skin is not sterile, your skin needs to be as free of germs as possible.  You can reduce the number of germs on you skin by washing with CHG (chlorahexidine gluconate) soap before surgery.  CHG is an antiseptic cleaner which kills germs and bonds with the skin to continue killing germs even after washing.  Please DO NOT use if you have an allergy to CHG or antibacterial soaps.  If your skin becomes reddened/irritated stop using the CHG and inform your nurse when you arrive  at Short Stay.  Do not shave (including legs and underarms) for at least 48 hours prior to the first CHG shower.  You may shave your face.  Please follow these instructions carefully:   1.  Shower with CHG Soap the night before surgery and the    morning of Surgery.  2.  If you choose to wash your hair, wash your hair first as usual with your  normal shampoo.  3.  After you shampoo, rinse your hair and body thoroughly to remove the   Shampoo.  4.  Use CHG as you would any other liquid soap.  You can apply chg directly  to the skin and wash gently with scrungie or a clean washcloth.  5.  Apply the CHG Soap to your body ONLY FROM THE NECK DOWN.    Do not use on open wounds or open sores.  Avoid contact with your eyes,       ears, mouth and genitals (private parts).  Wash genitals (private parts)   with your normal soap.  6.  Wash thoroughly, paying special attention to the area where your surgery   will be performed.  7.  Thoroughly rinse your body with warm water from the neck down.  8.  DO NOT shower/wash with your normal soap after using and rinsing off   the CHG Soap.  9.  Pat yourself dry with a clean towel.  10.  Wear clean pajamas.            11.  Place clean sheets on your bed the night of your first shower and do not        sleep with pets.  Day of Surgery  Do not apply any lotions/deoderants the morning of surgery.  Please wear clean clothes to the hospital/surgery center.     Please read over the following fact sheets that you were given. Coughing and Deep Breathing, MRSA Information and Surgical Site Infection Prevention

## 2016-04-18 NOTE — Progress Notes (Signed)
PCp is Dr [name] on [address] Denies ever seeing a cardiologist. Denies ever having a card cath, stress test, or echo. States that she was a difficult intubation at [facility] 10 years ago, but has had surgery since without problems. States they use an LMA Ebony Hail called and informed.

## 2016-04-19 LAB — HEMOGLOBIN A1C
HEMOGLOBIN A1C: 11.7 % — AB (ref 4.8–5.6)
Mean Plasma Glucose: 289 mg/dL

## 2016-04-19 NOTE — Progress Notes (Addendum)
Per Dr. Iran Planas cancel OR case due to abnormal A1C. Angie, RN in [place] notified

## 2016-04-21 ENCOUNTER — Encounter (HOSPITAL_COMMUNITY): Admission: RE | Payer: Self-pay | Source: Ambulatory Visit

## 2016-04-21 ENCOUNTER — Ambulatory Visit (HOSPITAL_COMMUNITY): Admission: RE | Admit: 2016-04-21 | Payer: Medicare Other | Source: Ambulatory Visit

## 2016-04-21 SURGERY — APPLICATION, GRAFT, SKIN, SPLIT-THICKNESS
Anesthesia: Monitor Anesthesia Care | Laterality: Left

## 2016-05-07 DIAGNOSIS — L89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, stage 4: Secondary | ICD-10-CM | POA: Diagnosis not present

## 2016-05-07 DIAGNOSIS — I10 Essential (primary) hypertension: Secondary | ICD-10-CM | POA: Diagnosis not present

## 2016-05-07 DIAGNOSIS — F0390 Unspecified dementia without behavioral disturbance: Secondary | ICD-10-CM | POA: Diagnosis not present

## 2016-05-07 DIAGNOSIS — E119 Type 2 diabetes mellitus without complications: Secondary | ICD-10-CM | POA: Diagnosis not present

## 2016-05-07 DIAGNOSIS — Z923 Personal history of irradiation: Secondary | ICD-10-CM | POA: Diagnosis not present

## 2016-05-07 DIAGNOSIS — G8221 Paraplegia, complete: Secondary | ICD-10-CM | POA: Diagnosis not present

## 2016-05-28 ENCOUNTER — Encounter (HOSPITAL_BASED_OUTPATIENT_CLINIC_OR_DEPARTMENT_OTHER): Payer: Medicare Other | Attending: Surgery

## 2016-05-28 DIAGNOSIS — F0390 Unspecified dementia without behavioral disturbance: Secondary | ICD-10-CM | POA: Insufficient documentation

## 2016-05-28 DIAGNOSIS — Z923 Personal history of irradiation: Secondary | ICD-10-CM | POA: Diagnosis not present

## 2016-05-28 DIAGNOSIS — E1140 Type 2 diabetes mellitus with diabetic neuropathy, unspecified: Secondary | ICD-10-CM | POA: Diagnosis not present

## 2016-05-28 DIAGNOSIS — G8221 Paraplegia, complete: Secondary | ICD-10-CM | POA: Insufficient documentation

## 2016-05-28 DIAGNOSIS — L89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, stage 4: Secondary | ICD-10-CM | POA: Diagnosis not present

## 2016-05-28 DIAGNOSIS — I10 Essential (primary) hypertension: Secondary | ICD-10-CM | POA: Insufficient documentation

## 2016-06-18 ENCOUNTER — Encounter (HOSPITAL_BASED_OUTPATIENT_CLINIC_OR_DEPARTMENT_OTHER): Payer: Medicare Other | Attending: Surgery

## 2016-06-18 DIAGNOSIS — G8221 Paraplegia, complete: Secondary | ICD-10-CM | POA: Insufficient documentation

## 2016-06-18 DIAGNOSIS — I10 Essential (primary) hypertension: Secondary | ICD-10-CM | POA: Insufficient documentation

## 2016-06-18 DIAGNOSIS — D649 Anemia, unspecified: Secondary | ICD-10-CM | POA: Insufficient documentation

## 2016-06-18 DIAGNOSIS — Z923 Personal history of irradiation: Secondary | ICD-10-CM | POA: Insufficient documentation

## 2016-06-18 DIAGNOSIS — F0390 Unspecified dementia without behavioral disturbance: Secondary | ICD-10-CM | POA: Insufficient documentation

## 2016-06-18 DIAGNOSIS — L89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, stage 4: Secondary | ICD-10-CM | POA: Insufficient documentation

## 2016-06-18 DIAGNOSIS — M8668 Other chronic osteomyelitis, other site: Secondary | ICD-10-CM | POA: Insufficient documentation

## 2016-06-18 DIAGNOSIS — E11622 Type 2 diabetes mellitus with other skin ulcer: Secondary | ICD-10-CM | POA: Insufficient documentation

## 2016-06-25 DIAGNOSIS — Z923 Personal history of irradiation: Secondary | ICD-10-CM | POA: Diagnosis not present

## 2016-06-25 DIAGNOSIS — G8221 Paraplegia, complete: Secondary | ICD-10-CM | POA: Diagnosis not present

## 2016-06-25 DIAGNOSIS — M8668 Other chronic osteomyelitis, other site: Secondary | ICD-10-CM | POA: Diagnosis not present

## 2016-06-25 DIAGNOSIS — D649 Anemia, unspecified: Secondary | ICD-10-CM | POA: Diagnosis not present

## 2016-06-25 DIAGNOSIS — I10 Essential (primary) hypertension: Secondary | ICD-10-CM | POA: Diagnosis not present

## 2016-06-25 DIAGNOSIS — L89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, stage 4: Secondary | ICD-10-CM | POA: Diagnosis not present

## 2016-06-25 DIAGNOSIS — F0390 Unspecified dementia without behavioral disturbance: Secondary | ICD-10-CM | POA: Diagnosis not present

## 2016-06-25 DIAGNOSIS — E11622 Type 2 diabetes mellitus with other skin ulcer: Secondary | ICD-10-CM | POA: Diagnosis present

## 2016-07-16 ENCOUNTER — Encounter (HOSPITAL_BASED_OUTPATIENT_CLINIC_OR_DEPARTMENT_OTHER): Payer: Medicare Other

## 2016-07-28 NOTE — H&P (Signed)
Subjective:     Patient [name] is a 66 y.o. female.  HPI  Here for follow up chronic pressure ulcer ischium. Patient known to me from debridement multiple necrotic pressure ulcers 10/2014 in setting of paraplegia from spinal stenosis. She has been under care [place] since that time. Last visit here in office 1.2018 and discussed A Cell placement. Her surgery was cancelled as she was noted to have HbA1c 11.6 and elevated BS. Reports has started treatment for DM and most recent HbA1c 5.7 desires to proceed with surgery.  Continues on [facility], has [place] home health.  Consulted with Dr. Morley Kos 02/2016 at [facility]. Per patient she discussed the post op restrictions post flap surgery and she does not feel she could comply with these.   Prealbumin 26.3 04/2016.   Treated 2016 for osteomyelitis. Has LAL mattress at home. States she is on protein supplement. Completed HBO course.  Review of Systems     Objective:   Physical Exam  Cardiovascular: Normal rate, regular rhythm and normal heart sounds.   Pulmonary/Chest: Effort normal and breath sounds normal.  Left ischium with 2 x 3 x 3 cm wound, 100% granulation without slough, base of wound hard consistent with bone     Assessment:     Left ischial ulcer stage 4.    Plan:     Chronic pressure ulcer. Patient has declined flap surgery, had prior consultation with Dr. Morley Kos.  Signed ROI for labs from [facility].  Clean wound and does not require debridement. Contraction noted since last visit, suspect due to improved BS control. Reviewed options to stimulate wound healing such as A Cell. Reviewed pig bladder source, may need reapplication. Reviewed that this is not same as skin graft and will still have open wound. As she is asensate in area, plan to do under MAC, OP procedure. Counseled cannot assure her this will change wound but she has stalled with regards to healing and failed HBO- plan to try  application A Cell and review progress. Will utilize VAC over A Cell.    Irene Limbo, MD [facility] Plastic & Reconstructive Surgery [phone], pin [phone]

## 2016-08-01 ENCOUNTER — Encounter (HOSPITAL_COMMUNITY)
Admission: RE | Admit: 2016-08-01 | Discharge: 2016-08-01 | Disposition: A | Discharge status: Home / Self Care | Payer: Medicare Other | Source: Ambulatory Visit | Attending: Plastic Surgery | Admitting: Plastic Surgery

## 2016-08-01 ENCOUNTER — Encounter (HOSPITAL_COMMUNITY): Payer: Self-pay

## 2016-08-01 DIAGNOSIS — E119 Type 2 diabetes mellitus without complications: Secondary | ICD-10-CM | POA: Insufficient documentation

## 2016-08-01 DIAGNOSIS — L89329 Pressure ulcer of left buttock, unspecified stage: Secondary | ICD-10-CM | POA: Insufficient documentation

## 2016-08-01 DIAGNOSIS — Z79899 Other long term (current) drug therapy: Secondary | ICD-10-CM | POA: Insufficient documentation

## 2016-08-01 DIAGNOSIS — E669 Obesity, unspecified: Secondary | ICD-10-CM | POA: Diagnosis not present

## 2016-08-01 DIAGNOSIS — Z01812 Encounter for preprocedural laboratory examination: Secondary | ICD-10-CM | POA: Diagnosis not present

## 2016-08-01 DIAGNOSIS — E039 Hypothyroidism, unspecified: Secondary | ICD-10-CM | POA: Diagnosis not present

## 2016-08-01 DIAGNOSIS — E785 Hyperlipidemia, unspecified: Secondary | ICD-10-CM | POA: Insufficient documentation

## 2016-08-01 DIAGNOSIS — G8220 Paraplegia, unspecified: Secondary | ICD-10-CM | POA: Diagnosis not present

## 2016-08-01 DIAGNOSIS — Z923 Personal history of irradiation: Secondary | ICD-10-CM | POA: Insufficient documentation

## 2016-08-01 DIAGNOSIS — Z87442 Personal history of urinary calculi: Secondary | ICD-10-CM | POA: Diagnosis not present

## 2016-08-01 DIAGNOSIS — Z01818 Encounter for other preprocedural examination: Secondary | ICD-10-CM | POA: Diagnosis not present

## 2016-08-01 DIAGNOSIS — Z853 Personal history of malignant neoplasm of breast: Secondary | ICD-10-CM | POA: Insufficient documentation

## 2016-08-01 DIAGNOSIS — G629 Polyneuropathy, unspecified: Secondary | ICD-10-CM | POA: Insufficient documentation

## 2016-08-01 DIAGNOSIS — Z87891 Personal history of nicotine dependence: Secondary | ICD-10-CM | POA: Diagnosis not present

## 2016-08-01 DIAGNOSIS — Z981 Arthrodesis status: Secondary | ICD-10-CM | POA: Diagnosis not present

## 2016-08-01 DIAGNOSIS — Z7984 Long term (current) use of oral hypoglycemic drugs: Secondary | ICD-10-CM | POA: Insufficient documentation

## 2016-08-01 DIAGNOSIS — K219 Gastro-esophageal reflux disease without esophagitis: Secondary | ICD-10-CM | POA: Insufficient documentation

## 2016-08-01 DIAGNOSIS — I10 Essential (primary) hypertension: Secondary | ICD-10-CM | POA: Insufficient documentation

## 2016-08-01 DIAGNOSIS — Z96652 Presence of left artificial knee joint: Secondary | ICD-10-CM | POA: Insufficient documentation

## 2016-08-01 HISTORY — DX: Type 2 diabetes mellitus without complications: E11.9

## 2016-08-01 LAB — CBC
HCT: 36 % (ref 36.0–46.0)
HEMOGLOBIN: 11.6 g/dL — AB (ref 12.0–15.0)
MCH: 29.2 pg (ref 26.0–34.0)
MCHC: 32.2 g/dL (ref 30.0–36.0)
MCV: 90.7 fL (ref 78.0–100.0)
Platelets: 346 10*3/uL (ref 150–400)
RBC: 3.97 MIL/uL (ref 3.87–5.11)
RDW: 15.3 % (ref 11.5–15.5)
WBC: 6.8 10*3/uL (ref 4.0–10.5)

## 2016-08-01 LAB — BASIC METABOLIC PANEL
ANION GAP: 9 (ref 5–15)
BUN: 24 mg/dL — ABNORMAL HIGH (ref 6–20)
CALCIUM: 9.6 mg/dL (ref 8.9–10.3)
CO2: 24 mmol/L (ref 22–32)
Chloride: 104 mmol/L (ref 101–111)
Creatinine, Ser: 0.94 mg/dL (ref 0.44–1.00)
GFR calc Af Amer: >60 mL/min (ref >60)
Glucose, Bld: 105 mg/dL — ABNORMAL HIGH (ref 65–99)
Potassium: 4.6 mmol/L (ref 3.5–5.1)
SODIUM: 137 mmol/L (ref 135–145)

## 2016-08-01 LAB — GLUCOSE, CAPILLARY: GLUCOSE-CAPILLARY: 85 mg/dL (ref 65–99)

## 2016-08-01 MED ORDER — CHLORHEXIDINE GLUCONATE CLOTH 2 % EX PADS: 6.0000 | MEDICATED_PAD | Freq: Once | CUTANEOUS | Status: DC

## 2016-08-01 NOTE — Pre-Procedure Instructions (Signed)
[name]  08/01/2016      [id number] [place], [place] - [address] [place] [address] Phone: [phone] Fax: [phone]    Your procedure is scheduled on May 1 at 1215 PM.  Report to [place] at 1015 AM.  Call this number if you have problems the morning of surgery:[phone]   Remember:  Do not eat food or drink liquids after midnight.  Take these medicines the morning of surgery with A SIP OF WATER baclofen (lioresal), docusate sodium (colace), duloxetine (cymbalta), gabapentin (neurontin), levothyroxine (synthroid), metoprolol (lopessor), morphine (MS contin), pantoprazole (protonix), oxycodone (percocet)-if needed for pain.   Do not wear jewelry, make-up or nail polish.  Do not wear lotions, powders, or perfumes, or deoderant.  Do not shave 48 hours prior to surgery.    Do not bring valuables to the hospital.  [facility] is not responsible for any belongings or valuables.  Contacts, dentures or bridgework may not be worn into surgery.  Leave your suitcase in the car.  After surgery it may be brought to your room.  For patients admitted to the hospital, discharge time will be determined by your treatment team.  Patients discharged the day of surgery will not be allowed to drive home.   Special instructions:      How to Manage Your Diabetes Before and After Surgery  Why is it important to control my blood sugar before and after surgery? . Improving blood sugar levels before and after surgery helps healing and can limit problems. . A way of improving blood sugar control is eating a healthy diet by: o  Eating less sugar and carbohydrates o  Increasing activity/exercise o  Talking with your doctor about reaching your blood sugar goals . High blood sugars (greater than 180 mg/dL) can raise your risk of infections and slow your recovery, so you will need to focus on  controlling your diabetes during the weeks before surgery. . Make sure that the doctor who takes care of your diabetes knows about your planned surgery including the date and location.  How do I manage my blood sugar before surgery? . Check your blood sugar at least 4 times a day, starting 2 days before surgery, to make sure that the level is not too high or low. o Check your blood sugar the morning of your surgery when you wake up and every 2 hours until you get to the Short Stay unit. . If your blood sugar is less than 70 mg/dL, you will need to treat for low blood sugar: o Do not take insulin. o Treat a low blood sugar (less than 70 mg/dL) with  cup of clear juice (cranberry or apple), 4 glucose tablets, OR glucose gel. o Recheck blood sugar in 15 minutes after treatment (to make sure it is greater than 70 mg/dL). If your blood sugar is not greater than 70 mg/dL on recheck, call [phone] for further instructions. . Report your blood sugar to the short stay nurse when you get to Short Stay.  . If you are admitted to the hospital after surgery: o Your blood sugar will be checked by the staff and you will probably be given insulin after surgery (instead of oral diabetes medicines) to make sure you have good blood sugar levels. o The goal for blood sugar control after surgery is 80-180 mg/dL.       WHAT DO I DO  ABOUT MY DIABETES MEDICATION?   Marland Kitchen Do not take oral diabetes medicines (pills) the morning of surgery.    Reviewed and Endorsed by [facility] Patient Education Committee, August 2015 [facility]- Preparing For Surgery  Before surgery, you can play an important role. Because skin is not sterile, your skin needs to be as free of germs as possible. You can reduce the number of germs on your skin by washing with CHG (chlorahexidine gluconate) Soap before surgery.  CHG is an antiseptic cleaner which kills germs and bonds with the skin to continue killing germs even after  washing.  Please do not use if you have an allergy to CHG or antibacterial soaps. If your skin becomes reddened/irritated stop using the CHG.  Do not shave (including legs and underarms) for at least 48 hours prior to first CHG shower. It is OK to shave your face.  Please follow these instructions carefully.   1. Shower the NIGHT BEFORE SURGERY and the MORNING OF SURGERY with CHG.   2. If you chose to wash your hair, wash your hair first as usual with your normal shampoo.  3. After you shampoo, rinse your hair and body thoroughly to remove the shampoo.  4. Use CHG as you would any other liquid soap. You can apply CHG directly to the skin and wash gently with a scrungie or a clean washcloth.   5. Apply the CHG Soap to your body ONLY FROM THE NECK DOWN.  Do not use on open wounds or open sores. Avoid contact with your eyes, ears, mouth and genitals (private parts). Wash genitals (private parts) with your normal soap.  6. Wash thoroughly, paying special attention to the area where your surgery will be performed.  7. Thoroughly rinse your body with warm water from the neck down.  8. DO NOT shower/wash with your normal soap after using and rinsing off the CHG Soap.  9. Pat yourself dry with a CLEAN TOWEL.   10. Wear CLEAN PAJAMAS   11. Place CLEAN SHEETS on your bed the night of your first shower and DO NOT SLEEP WITH PETS.  Day of Surgery: Do not apply any deodorants/lotions. Please wear clean clothes to the hospital/surgery center.     Please read over the following fact sheets that you were given. Pain Booklet, Coughing and Deep Breathing and Surgical Site Infection Prevention

## 2016-08-01 NOTE — Progress Notes (Addendum)
PCP: Dr. [name]  Cardiologist: pt denies  EKG:10/2015 in EPIC  Stress test: pt denies   ECHO: pt denies  Cardiac Cath: pt denies  Chest x-ray: 10/2015 in [facility]

## 2016-08-04 NOTE — Progress Notes (Signed)
Anesthesia Chart Review: Patient is a 66 year old female scheduled for surgical prep for grafting and application of Acell to left ischium on 08/05/16 L buttock on 04/21/2016 with Irene Limbo, MD. Procedure was initially scheduled for 04/11/16, but was canceled due significant hyperglycemia with new diagnosis of DM2 (A1c 11.7 on 04/18/16). (She is now on metformin, and by 07/17/16 office note with Dr. Iran Planas, patient's most recent A1c was 5.7. Records pending.)  History includes DIFFICULT AIRWAY, former smoker (quit 04/07/12), obesity, HTN, HLD, hypothyroidism, anemia, GERD, post-operative N/V, kidney stones, arthritis, C2-T2 cervical laminectomy '06 with "fusion", peripheral neuropathy, paraplegia (related to spinal stenosis), breast cancer s/p right lumpectomy s/p radiation '11 left TKA 04/13/12, I&D sacral decubitus 11/03/14.    In regards to her DIFFICULT AIRWAY history. I reviewed prior anesthesia records when I evaluated her prior to left TKA. Her cervical laminectomy was done at [facility] on 09/28/03. She was noted to have a difficult airway.  Notes indicate that "unable to visualize epiglottis without extension of neck.  Fastrach [intubating LMA] #4 placed easily with good chest rise and #7 ETT passed easily..."  A #4 Supreme LMA was used during her right breast surgery on 01/22/10. FNB and #4 LMA used 04/13/12 with left TKA.   - PCP is [name], MD with [facility]  Medications include baclofen, Cymbalta, 65 Fe, folic acid, Lasix, gabapentin, levothyroxine, metoprolol, metformin, MS Contin, nifedipine, Percocet, Protonix, zinc sulfate.  BP 132/63   Pulse 73   Temp 36.7 C   Resp 20   Ht 5\' 3"  (1.6 m)   SpO2 100%   EKG 10/28/15: Sinus tachycardia (116 bpm). Minimal voltage criteria for LVH, may be normal variant. Nonspecific T wave abnormality. (HR was 73 bpm at PAT.)  1V CXR 10/30/15: IMPRESSION: 1. Interval placement of left-sided PICC line. The tip is in the distal  SVC. 2. Persistent low lung volumes and interstitial coarsening.  Preoperative labs reviewed. Cr 0.94. Glucose 105. H/H 11.6/36.0. Her last A1c in Epic was 11.7 from 04/18/16. It does not appear that her A1c was repeated or last A1c was requested at her PAT visit. By Plastics notes, A1c is now < 6. Last PCP office note with A1c requested this morning. Patient will get a fasting CBG on arrival, and if result acceptable then I would anticipate that she can proceed as planned. (Update: A1c results from [facility] still not received as of 4:55 PM 08/04/16.).  George Hugh [facility] Short Stay Center/Anesthesiology Phone [phone] 08/04/2016 4:55 PM

## 2016-08-05 ENCOUNTER — Ambulatory Visit (HOSPITAL_COMMUNITY)
Admission: RE | Admit: 2016-08-05 | Discharge: 2016-08-05 | Disposition: A | Discharge status: Home / Self Care | Payer: Medicare Other | Source: Ambulatory Visit | Attending: Plastic Surgery | Admitting: Plastic Surgery

## 2016-08-05 ENCOUNTER — Ambulatory Visit (HOSPITAL_COMMUNITY): Payer: Medicare Other | Admitting: Anesthesiology

## 2016-08-05 ENCOUNTER — Encounter (HOSPITAL_COMMUNITY)
Admission: RE | Disposition: A | Discharge status: Home / Self Care | Payer: Self-pay | Source: Ambulatory Visit | Attending: Plastic Surgery

## 2016-08-05 ENCOUNTER — Encounter (HOSPITAL_COMMUNITY): Payer: Self-pay | Admitting: Certified Registered"

## 2016-08-05 ENCOUNTER — Ambulatory Visit (HOSPITAL_COMMUNITY): Payer: Medicare Other

## 2016-08-05 DIAGNOSIS — Z923 Personal history of irradiation: Secondary | ICD-10-CM | POA: Insufficient documentation

## 2016-08-05 DIAGNOSIS — I10 Essential (primary) hypertension: Secondary | ICD-10-CM | POA: Insufficient documentation

## 2016-08-05 DIAGNOSIS — Z853 Personal history of malignant neoplasm of breast: Secondary | ICD-10-CM | POA: Diagnosis not present

## 2016-08-05 DIAGNOSIS — Z96652 Presence of left artificial knee joint: Secondary | ICD-10-CM | POA: Insufficient documentation

## 2016-08-05 DIAGNOSIS — G8220 Paraplegia, unspecified: Secondary | ICD-10-CM | POA: Diagnosis not present

## 2016-08-05 DIAGNOSIS — K219 Gastro-esophageal reflux disease without esophagitis: Secondary | ICD-10-CM | POA: Insufficient documentation

## 2016-08-05 DIAGNOSIS — E1142 Type 2 diabetes mellitus with diabetic polyneuropathy: Secondary | ICD-10-CM | POA: Diagnosis not present

## 2016-08-05 DIAGNOSIS — L89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, stage 4: Secondary | ICD-10-CM | POA: Diagnosis not present

## 2016-08-05 DIAGNOSIS — Z87891 Personal history of nicotine dependence: Secondary | ICD-10-CM | POA: Insufficient documentation

## 2016-08-05 DIAGNOSIS — Z7984 Long term (current) use of oral hypoglycemic drugs: Secondary | ICD-10-CM | POA: Diagnosis not present

## 2016-08-05 HISTORY — PX: SKIN SPLIT GRAFT: SHX444

## 2016-08-05 LAB — GLUCOSE, CAPILLARY
GLUCOSE-CAPILLARY: 92 mg/dL (ref 65–99)
Glucose-Capillary: 99 mg/dL (ref 65–99)

## 2016-08-05 SURGERY — APPLICATION, GRAFT, SKIN, SPLIT-THICKNESS
Anesthesia: Monitor Anesthesia Care | Site: Buttocks | Laterality: Left

## 2016-08-05 MED ORDER — MEPERIDINE HCL 25 MG/ML IJ SOLN: 6.2500 mg | INTRAMUSCULAR | Status: DC | PRN

## 2016-08-05 MED ORDER — FENTANYL CITRATE (PF) 250 MCG/5ML IJ SOLN
INTRAMUSCULAR | Status: AC
  Filled 2016-08-05: qty 5

## 2016-08-05 MED ORDER — FENTANYL CITRATE (PF) 100 MCG/2ML IJ SOLN: 25.0000 ug | INTRAMUSCULAR | Status: DC | PRN

## 2016-08-05 MED ORDER — PROPOFOL 10 MG/ML IV BOLUS
INTRAVENOUS | Status: DC | PRN
  Administered 2016-08-05 (×2): 10 mg via INTRAVENOUS
  Administered 2016-08-05: 20 mg via INTRAVENOUS
  Administered 2016-08-05: 10 mg via INTRAVENOUS

## 2016-08-05 MED ORDER — CEFAZOLIN SODIUM-DEXTROSE 2-4 GM/100ML-% IV SOLN
2.0000 g | INTRAVENOUS | Status: AC
  Administered 2016-08-05: 2 g via INTRAVENOUS

## 2016-08-05 MED ORDER — ONDANSETRON HCL 4 MG/2ML IJ SOLN: 4.0000 mg | Freq: Once | INTRAMUSCULAR | Status: DC | PRN

## 2016-08-05 MED ORDER — MIDAZOLAM HCL 2 MG/2ML IJ SOLN
INTRAMUSCULAR | Status: DC | PRN
Start: 2016-08-05 — End: 2016-08-05
  Administered 2016-08-05 (×2): 1 mg via INTRAVENOUS

## 2016-08-05 MED ORDER — OXYCODONE HCL 5 MG/5ML PO SOLN
5.0000 mg | Freq: Once | ORAL | Status: DC | PRN
Start: 2016-08-05 — End: 2016-08-05

## 2016-08-05 MED ORDER — 0.9 % SODIUM CHLORIDE (POUR BTL) OPTIME
TOPICAL | Status: DC | PRN
  Administered 2016-08-05: 1000 mL

## 2016-08-05 MED ORDER — MIDAZOLAM HCL 2 MG/2ML IJ SOLN
INTRAMUSCULAR | Status: AC
  Filled 2016-08-05: qty 2

## 2016-08-05 MED ORDER — ACETAMINOPHEN 325 MG PO TABS: 325.0000 mg | ORAL_TABLET | ORAL | Status: DC | PRN

## 2016-08-05 MED ORDER — LACTATED RINGERS IV SOLN
INTRAVENOUS | Status: DC | PRN
  Administered 2016-08-05: 12:00:00 via INTRAVENOUS

## 2016-08-05 MED ORDER — LACTATED RINGERS IV SOLN
Freq: Once | INTRAVENOUS | Status: AC
  Administered 2016-08-05: 11:00:00 via INTRAVENOUS

## 2016-08-05 MED ORDER — OXYCODONE HCL 5 MG PO TABS: 5.0000 mg | ORAL_TABLET | Freq: Once | ORAL | Status: DC | PRN

## 2016-08-05 MED ORDER — ACETAMINOPHEN 160 MG/5ML PO SOLN: 325.0000 mg | ORAL | Status: DC | PRN

## 2016-08-05 SURGICAL SUPPLY — 58 items
APL SKNCLS STERI-STRIP NONHPOA (GAUZE/BANDAGES/DRESSINGS) ×2
APPLIER CLIP 9.375 MED OPEN (MISCELLANEOUS)
APR CLP MED 9.3 20 MLT OPN (MISCELLANEOUS)
BANDAGE ACE 4X5 VEL STRL LF (GAUZE/BANDAGES/DRESSINGS) IMPLANT
BANDAGE ACE 6X5 VEL STRL LF (GAUZE/BANDAGES/DRESSINGS) IMPLANT
BENZOIN TINCTURE PRP APPL 2/3 (GAUZE/BANDAGES/DRESSINGS) ×3 IMPLANT
BLADE CLIPPER SURG (BLADE) IMPLANT
BLADE DERMATOME II (BLADE) ×4 IMPLANT
BNDG COHESIVE 4X5 TAN STRL (GAUZE/BANDAGES/DRESSINGS) IMPLANT
BNDG COHESIVE 6X5 TAN STRL LF (GAUZE/BANDAGES/DRESSINGS) ×4 IMPLANT
BNDG GAUZE ELAST 4 BULKY (GAUZE/BANDAGES/DRESSINGS) IMPLANT
CANISTER SUCT 3000ML PPV (MISCELLANEOUS) IMPLANT
CANISTER WOUND CARE 500ML ATS (WOUND CARE) IMPLANT
CLIP APPLIE 9.375 MED OPEN (MISCELLANEOUS) IMPLANT
CONT SPECI 4OZ STER CLIK (MISCELLANEOUS) IMPLANT
COVER SURGICAL LIGHT HANDLE (MISCELLANEOUS) ×4 IMPLANT
DERMACARRIERS GRAFT 1 TO 1.5 (DISPOSABLE) ×4
DRAIN WOUND SNY 15 RND (WOUND CARE) IMPLANT
DRAPE HALF SHEET 40X57 (DRAPES) ×8 IMPLANT
DRAPE INCISE IOBAN 66X45 STRL (DRAPES) IMPLANT
DRESSING TELFA 8X10 (GAUZE/BANDAGES/DRESSINGS) ×4 IMPLANT
DRSG ADAPTIC 3X8 NADH LF (GAUZE/BANDAGES/DRESSINGS) ×4 IMPLANT
DRSG PAD ABDOMINAL 8X10 ST (GAUZE/BANDAGES/DRESSINGS) ×8 IMPLANT
DRSG VAC ATS MED SENSATRAC (GAUZE/BANDAGES/DRESSINGS) ×3 IMPLANT
DRSG VAC ATS SM SENSATRAC (GAUZE/BANDAGES/DRESSINGS) IMPLANT
ELECT COATED BLADE 2.86 ST (ELECTRODE) ×4 IMPLANT
ELECT REM PT RETURN 9FT ADLT (ELECTROSURGICAL)
ELECTRODE REM PT RTRN 9FT ADLT (ELECTROSURGICAL) IMPLANT
EVACUATOR SILICONE 100CC (DRAIN) IMPLANT
GAUZE SPONGE 4X4 12PLY STRL (GAUZE/BANDAGES/DRESSINGS) ×4 IMPLANT
GEL ULTRASOUND 20GR AQUASONIC (MISCELLANEOUS) ×4 IMPLANT
GLOVE BIO SURGEON STRL SZ 6 (GLOVE) ×4 IMPLANT
GOWN STRL REUS W/ TWL LRG LVL3 (GOWN DISPOSABLE) ×4 IMPLANT
GOWN STRL REUS W/TWL LRG LVL3 (GOWN DISPOSABLE) ×8
GRAFT DERMACARRIERS 1 TO 1.5 (DISPOSABLE) ×2 IMPLANT
KIT BASIN OR (CUSTOM PROCEDURE TRAY) ×4 IMPLANT
KIT ROOM TURNOVER OR (KITS) ×4 IMPLANT
MATRIX WOUND 3-LAYER 7X10 (Tissue) ×2 IMPLANT
NS IRRIG 1000ML POUR BTL (IV SOLUTION) ×4 IMPLANT
PACK GENERAL/GYN (CUSTOM PROCEDURE TRAY) ×4 IMPLANT
PAD ARMBOARD 7.5X6 YLW CONV (MISCELLANEOUS) ×8 IMPLANT
PADDING CAST COTTON 6X4 STRL (CAST SUPPLIES) IMPLANT
PIN SAFETY STERILE (MISCELLANEOUS) ×4 IMPLANT
STAPLER VISISTAT 35W (STAPLE) IMPLANT
STOCKINETTE IMPERVIOUS LG (DRAPES) ×4 IMPLANT
SUT CHROMIC 4 0 PS 2 18 (SUTURE) IMPLANT
SUT ETHILON 2 0 FS 18 (SUTURE) IMPLANT
SUT VIC AB 4-0 PS2 27 (SUTURE) IMPLANT
SUT VIC AB 5-0 P-3 18XBRD (SUTURE) IMPLANT
SUT VIC AB 5-0 P3 18 (SUTURE)
SWAB CULTURE ESWAB REG 1ML (MISCELLANEOUS) IMPLANT
SWAB CULTURE LIQUID MINI MALE (MISCELLANEOUS) IMPLANT
SYR CONTROL 10ML LL (SYRINGE) IMPLANT
TAPE CLOTH SURG 4X10 WHT LF (GAUZE/BANDAGES/DRESSINGS) IMPLANT
TOWEL OR 17X24 6PK STRL BLUE (TOWEL DISPOSABLE) ×4 IMPLANT
TOWEL OR 17X26 10 PK STRL BLUE (TOWEL DISPOSABLE) ×4 IMPLANT
UNDERPAD 30X30 (UNDERPADS AND DIAPERS) ×4 IMPLANT
WOUND MATRIX 3-LAYER 7X10 (Tissue) ×1 IMPLANT

## 2016-08-05 NOTE — Transfer of Care (Signed)
Immediate Anesthesia Transfer of Care Note  Patient: [name]  Procedure(s) Performed: Procedure(s): SURGICAL PREP  FOR GRAFTING APPLICATION ACELL TO LEFT ISCHIUM (Left)  Patient Location: PACU  Anesthesia Type:MAC  Level of Consciousness: awake, alert , oriented and patient cooperative  Airway & Oxygen Therapy: Patient Spontanous Breathing and Patient connected to nasal cannula oxygen  Post-op Assessment: Report given to RN, Post -op Vital signs reviewed and stable and Patient moving all extremities X 4  Post vital signs: Reviewed and stable  Last Vitals:  Vitals:   08/05/16 1023  BP: (!) 157/45  Pulse: 72  Resp: 20  Temp: 36.4 C    Last Pain:  Vitals:   08/05/16 1023  TempSrc: Oral      Patients Stated Pain Goal: 4 (82/42/35 3614)  Complications: No apparent anesthesia complications

## 2016-08-05 NOTE — Anesthesia Preprocedure Evaluation (Addendum)
Anesthesia Evaluation  Patient identified by MRN, date of birth, ID band Patient awake    Reviewed: Allergy & Precautions, NPO status , Patient's Chart, lab work & pertinent test results, reviewed documented beta blocker date and time   History of Anesthesia Complications (+) PONV and DIFFICULT AIRWAY  Airway Mallampati: III  TM Distance: >3 FB Neck ROM: Limited  Mouth opening: Limited Mouth Opening  Dental  (+) Teeth Intact, Dental Advisory Given, Poor Dentition, Chipped   Pulmonary former smoker,    Pulmonary exam normal breath sounds clear to auscultation       Cardiovascular hypertension, Pt. on medications and Pt. on home beta blockers Normal cardiovascular exam Rhythm:Regular Rate:Normal     Neuro/Psych negative psych ROS   GI/Hepatic Neg liver ROS, GERD  Medicated and Controlled,  Endo/Other  diabetes, Well Controlled, Type 2, Oral Hypoglycemic AgentsHypothyroidism   Renal/GU   negative genitourinary   Musculoskeletal   Abdominal (+) + obese,   Peds  Hematology   Anesthesia Other Findings  Anesthesiology Progress Notes Date of Service: 08/01/2016 11:59 PM   Related encounter: Pre-Admission Testing 60 from 08/01/2016 in [place]    Anesthesia Chart Review: Patient is a 66 year old female scheduled for surgical prep for grafting and application of Acell to left ischium on 08/05/16 L buttock on 04/21/2016 with Irene Limbo, MD. Procedure was initially scheduled for 04/11/16, but was canceled due significant hyperglycemia with new diagnosis of DM2 (A1c 11.7 on 04/18/16). (She is now on metformin, and by 07/17/16 office note with Dr. Iran Planas, patient's most recent A1c was 5.7. Records pending.)  History includes DIFFICULT AIRWAY, former smoker (quit 04/07/12), obesity, HTN, HLD, hypothyroidism, anemia, GERD, post-operative N/V, kidney stones, arthritis, C2-T2 cervical laminectomy  '06 with "fusion", peripheral neuropathy, paraplegia (related to spinal stenosis), breast cancer s/p right lumpectomy s/p radiation '11 left TKA 04/13/12, I&D sacral decubitus 11/03/14.   In regards to her DIFFICULT AIRWAY history. I reviewed prior anesthesia records when I evaluated her prior to left TKA. Her cervical laminectomy was done at [facility] on 09/28/03. She was noted to have a difficult airway. Notes indicate that "unable to visualize epiglottis without extension of neck. Fastrach (intubating LMA) #4 placed easily with good chest rise and #7 ETT passed easily..." A #4 Supreme LMA was used during her right breast surgery on 01/22/10. FNB and #4 LMA used 04/13/12 with left TKA.   - PCP is [name], MD with [facility] ([facility]).  Medications include baclofen, Cymbalta, 65 Fe, folic acid, Lasix, gabapentin, levothyroxine, metoprolol, metformin, MS Contin, nifedipine, Percocet, Protonix, zinc sulfate.  BP 132/63   Pulse 73   Temp 36.7 C   Resp 20   Ht 5' 3"  (1.6 m)   SpO2 100%   EKG 10/28/15: Sinus tachycardia (116 bpm). Minimal voltage criteria for LVH, may be normal variant. Nonspecific T wave abnormality. (HR was 73 bpm at PAT.)  1V CXR 10/30/15: IMPRESSION: 1. Interval placement of left-sided PICC line. The tip is in the distal SVC. 2. Persistent low lung volumes and interstitial coarsening.  Preoperative labs reviewed. Cr 0.94. Glucose 105. H/H 11.6/36.0. Her last A1c in Epic was 11.7 from 04/18/16. It does not appear that her A1c was repeated or last A1c was requested at her PAT visit. By Plastics notes, A1c is now < 6. Last PCP office note with A1c requested this morning. Patient will get a fasting CBG on arrival, and if result acceptable then I would anticipate  that she can proceed as planned. (Update: A1c results from [facility] still not received as of 4:55 PM 08/04/16.).  George Hugh [facility] Short Stay Center/Anesthesiology Phone [phone] 08/04/2016 4:55 PM          Reproductive/Obstetrics                           Anesthesia Physical Anesthesia Plan  ASA: III  Anesthesia Plan: MAC   Post-op Pain Management:    Induction: Intravenous  Airway Management Planned: Natural Airway and Simple Face Mask  Additional Equipment:   Intra-op Plan:   Post-operative Plan:   Informed Consent: I have reviewed the patients History and Physical, chart, labs and discussed the procedure including the risks, benefits and alternatives for the proposed anesthesia with the patient or authorized representative who has indicated his/her understanding and acceptance.     Plan Discussed with: CRNA, Surgeon and Anesthesiologist  Anesthesia Plan Comments:        Anesthesia Quick Evaluation

## 2016-08-05 NOTE — Op Note (Signed)
Operative Note   DATE OF OPERATION: 5.1.18  LOCATION: [facility] OR-outpatient  SURGICAL DIVISION: Plastic Surgery  PREOPERATIVE DIAGNOSES:  1. Stage IV pressure ulcer left ischium  POSTOPERATIVE DIAGNOSES:  same  PROCEDURE:  1. Surgical preparation for grafting 10 cm2 2. Application A Cell Matrix to left ischium 20 cm2  SURGEON: Irene Limbo MD MBA  ASSISTANT: mpme  ANESTHESIA:  MAC.   EBL: minima;  COMPLICATIONS: None immediate.   INDICATIONS FOR PROCEDURE:  The patient, [name], is a 66 y.o. female born on [birth year], is here for application A Cell matrix to chronic left ischial ulcer that has failed to respond to several months local wound care and alternative methods.   FINDINGS: Granulation tissue present 100% wound, clean, base of wound bone that is granulated, hard in nature. 2.5 x 3.5 x 2.5 cm wound with undermining at 7 o clock to 4 cm.  DESCRIPTION OF PROCEDURE:  The patient's operative site was marked with the patient in the preoperative area. The patient was taken to the operating room. SCDs were placed and IV antibiotics were given. The patient's operative site was prepped and draped in a sterile fashion. A time out was performed and all information was confirmed to be correct. Curettage performed over wound margins, side walls and base. Wound irrigated. A Cell sheet trimmed to defect and inset with interrupted 4-0 vicryl. Adaptic placed over A Cell and VAC applied, set to 125 mm Hg continuous, with trac pad bridged to left lateral thigh.   The patient was taken to the recovery room in satisfactory condition.   SPECIMENS: none  DRAINS: none  Irene Limbo, MD [facility] Plastic & Reconstructive Surgery [phone], pin [phone]

## 2016-08-05 NOTE — Interval H&P Note (Signed)
History and Physical Interval Note:  08/05/2016 11:42 AM  [name]  has presented today for surgery, with the diagnosis of LEFT ISSIAL ULCER  The various methods of treatment have been discussed with the patient and family. After consideration of risks, benefits and other options for treatment, the patient has consented to  Procedure(s): SURGICAL PREP  FOR GRAFTING APPLICATION ACELL TO LEFT ISCHIUM (Right) APPLICATION OF A-CELL OF CHEST/ABDOMEN (Right) as a surgical intervention .  The patient's history has been reviewed, patient examined, no change in status, stable for surgery.  I have reviewed the patient's chart and labs.  Questions were answered to the patient's satisfaction.     Latresa Gasser

## 2016-08-05 NOTE — Anesthesia Postprocedure Evaluation (Addendum)
Anesthesia Post Note  Patient: [name]  Procedure(s) Performed: Procedure(s) (LRB): SURGICAL PREP  FOR GRAFTING APPLICATION ACELL TO LEFT ISCHIUM (Left)  Patient location during evaluation: PACU Anesthesia Type: MAC Level of consciousness: awake Pain management: pain level controlled Vital Signs Assessment: post-procedure vital signs reviewed and stable Respiratory status: spontaneous breathing Cardiovascular status: stable Postop Assessment: no signs of nausea or vomiting Anesthetic complications: no        Last Vitals:  Vitals:   08/05/16 1315 08/05/16 1330  BP: 98/70 93/68  Pulse: 94 90  Resp: (!) 24 (!) 22  Temp: 36.3 C 36.4 C    Last Pain:  Vitals:   08/05/16 1330  TempSrc:   PainSc: 0-No pain   Pain Goal: Patients Stated Pain Goal: 4 (08/05/16 1041)               [place]

## 2016-08-06 ENCOUNTER — Encounter (HOSPITAL_COMMUNITY): Payer: Self-pay | Admitting: Plastic Surgery

## 2016-09-10 ENCOUNTER — Encounter (HOSPITAL_BASED_OUTPATIENT_CLINIC_OR_DEPARTMENT_OTHER): Payer: Medicare Other | Attending: Surgery

## 2016-09-10 DIAGNOSIS — L89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, stage 4: Secondary | ICD-10-CM | POA: Diagnosis not present

## 2016-09-10 DIAGNOSIS — F0390 Unspecified dementia without behavioral disturbance: Secondary | ICD-10-CM | POA: Insufficient documentation

## 2016-09-10 DIAGNOSIS — Z87891 Personal history of nicotine dependence: Secondary | ICD-10-CM | POA: Insufficient documentation

## 2016-09-10 DIAGNOSIS — Z923 Personal history of irradiation: Secondary | ICD-10-CM | POA: Insufficient documentation

## 2016-09-10 DIAGNOSIS — E039 Hypothyroidism, unspecified: Secondary | ICD-10-CM | POA: Insufficient documentation

## 2016-09-10 DIAGNOSIS — G8220 Paraplegia, unspecified: Secondary | ICD-10-CM | POA: Diagnosis not present

## 2016-09-10 DIAGNOSIS — I10 Essential (primary) hypertension: Secondary | ICD-10-CM | POA: Diagnosis not present

## 2016-09-10 DIAGNOSIS — Z853 Personal history of malignant neoplasm of breast: Secondary | ICD-10-CM | POA: Insufficient documentation

## 2016-09-10 DIAGNOSIS — G8221 Paraplegia, complete: Secondary | ICD-10-CM | POA: Insufficient documentation

## 2016-09-10 DIAGNOSIS — Z96652 Presence of left artificial knee joint: Secondary | ICD-10-CM | POA: Insufficient documentation

## 2016-09-10 DIAGNOSIS — E119 Type 2 diabetes mellitus without complications: Secondary | ICD-10-CM | POA: Diagnosis not present

## 2016-09-12 NOTE — Addendum Note (Signed)
Addendum  created 09/12/16 1212 by Lajuanna Pompa, MD   Sign clinical note    

## 2016-10-01 DIAGNOSIS — L89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, stage 4: Secondary | ICD-10-CM | POA: Diagnosis not present

## 2016-10-21 ENCOUNTER — Encounter (HOSPITAL_BASED_OUTPATIENT_CLINIC_OR_DEPARTMENT_OTHER): Payer: Medicare Other | Attending: Surgery

## 2016-10-21 DIAGNOSIS — Z923 Personal history of irradiation: Secondary | ICD-10-CM | POA: Diagnosis not present

## 2016-10-21 DIAGNOSIS — E119 Type 2 diabetes mellitus without complications: Secondary | ICD-10-CM | POA: Insufficient documentation

## 2016-10-21 DIAGNOSIS — I10 Essential (primary) hypertension: Secondary | ICD-10-CM | POA: Diagnosis not present

## 2016-10-21 DIAGNOSIS — Z853 Personal history of malignant neoplasm of breast: Secondary | ICD-10-CM | POA: Insufficient documentation

## 2016-10-21 DIAGNOSIS — F0390 Unspecified dementia without behavioral disturbance: Secondary | ICD-10-CM | POA: Diagnosis not present

## 2016-10-21 DIAGNOSIS — G8220 Paraplegia, unspecified: Secondary | ICD-10-CM | POA: Diagnosis not present

## 2016-10-21 DIAGNOSIS — G8221 Paraplegia, complete: Secondary | ICD-10-CM | POA: Diagnosis not present

## 2016-10-21 DIAGNOSIS — L89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, stage 4: Secondary | ICD-10-CM | POA: Insufficient documentation

## 2016-10-22 ENCOUNTER — Encounter (HOSPITAL_BASED_OUTPATIENT_CLINIC_OR_DEPARTMENT_OTHER): Payer: Medicare Other

## 2016-11-19 ENCOUNTER — Encounter (HOSPITAL_BASED_OUTPATIENT_CLINIC_OR_DEPARTMENT_OTHER): Payer: Medicare Other | Attending: Surgery

## 2016-11-19 DIAGNOSIS — L89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, stage 4: Secondary | ICD-10-CM | POA: Diagnosis not present

## 2016-11-19 DIAGNOSIS — I10 Essential (primary) hypertension: Secondary | ICD-10-CM | POA: Insufficient documentation

## 2016-11-19 DIAGNOSIS — Z923 Personal history of irradiation: Secondary | ICD-10-CM | POA: Insufficient documentation

## 2016-11-19 DIAGNOSIS — E119 Type 2 diabetes mellitus without complications: Secondary | ICD-10-CM | POA: Insufficient documentation

## 2016-11-19 DIAGNOSIS — F0390 Unspecified dementia without behavioral disturbance: Secondary | ICD-10-CM | POA: Diagnosis not present

## 2016-11-19 DIAGNOSIS — G8221 Paraplegia, complete: Secondary | ICD-10-CM | POA: Diagnosis not present

## 2016-12-17 ENCOUNTER — Encounter (HOSPITAL_BASED_OUTPATIENT_CLINIC_OR_DEPARTMENT_OTHER): Payer: Medicare Other | Attending: Surgery

## 2016-12-17 DIAGNOSIS — I10 Essential (primary) hypertension: Secondary | ICD-10-CM | POA: Insufficient documentation

## 2016-12-17 DIAGNOSIS — G8221 Paraplegia, complete: Secondary | ICD-10-CM | POA: Diagnosis not present

## 2016-12-17 DIAGNOSIS — L89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, stage 4: Secondary | ICD-10-CM | POA: Insufficient documentation

## 2016-12-17 DIAGNOSIS — F0390 Unspecified dementia without behavioral disturbance: Secondary | ICD-10-CM | POA: Insufficient documentation

## 2016-12-17 DIAGNOSIS — E119 Type 2 diabetes mellitus without complications: Secondary | ICD-10-CM | POA: Diagnosis not present

## 2016-12-17 DIAGNOSIS — Z923 Personal history of irradiation: Secondary | ICD-10-CM | POA: Insufficient documentation

## 2017-01-21 ENCOUNTER — Encounter (HOSPITAL_BASED_OUTPATIENT_CLINIC_OR_DEPARTMENT_OTHER): Payer: Medicare Other | Attending: Surgery

## 2017-01-21 DIAGNOSIS — G8221 Paraplegia, complete: Secondary | ICD-10-CM | POA: Diagnosis not present

## 2017-01-21 DIAGNOSIS — L89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, stage 4: Secondary | ICD-10-CM | POA: Diagnosis not present

## 2017-01-21 DIAGNOSIS — F0390 Unspecified dementia without behavioral disturbance: Secondary | ICD-10-CM | POA: Diagnosis not present

## 2017-01-21 DIAGNOSIS — I10 Essential (primary) hypertension: Secondary | ICD-10-CM | POA: Insufficient documentation

## 2017-01-21 DIAGNOSIS — Z923 Personal history of irradiation: Secondary | ICD-10-CM | POA: Insufficient documentation

## 2017-02-18 ENCOUNTER — Encounter (HOSPITAL_BASED_OUTPATIENT_CLINIC_OR_DEPARTMENT_OTHER): Payer: Medicare Other | Attending: Surgery

## 2017-02-18 DIAGNOSIS — I10 Essential (primary) hypertension: Secondary | ICD-10-CM | POA: Insufficient documentation

## 2017-02-18 DIAGNOSIS — Z923 Personal history of irradiation: Secondary | ICD-10-CM | POA: Insufficient documentation

## 2017-02-18 DIAGNOSIS — L89892 Pressure ulcer of other site, stage 2: Secondary | ICD-10-CM | POA: Insufficient documentation

## 2017-02-18 DIAGNOSIS — G8221 Paraplegia, complete: Secondary | ICD-10-CM | POA: Insufficient documentation

## 2017-02-18 DIAGNOSIS — F0390 Unspecified dementia without behavioral disturbance: Secondary | ICD-10-CM | POA: Diagnosis not present

## 2017-02-18 DIAGNOSIS — L89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, stage 4: Secondary | ICD-10-CM | POA: Diagnosis not present

## 2017-02-18 DIAGNOSIS — Z853 Personal history of malignant neoplasm of breast: Secondary | ICD-10-CM | POA: Diagnosis not present

## 2017-03-25 ENCOUNTER — Encounter (HOSPITAL_BASED_OUTPATIENT_CLINIC_OR_DEPARTMENT_OTHER): Payer: Medicare Other | Attending: Internal Medicine

## 2017-03-25 DIAGNOSIS — L89892 Pressure ulcer of other site, stage 2: Secondary | ICD-10-CM | POA: Insufficient documentation

## 2017-03-25 DIAGNOSIS — I10 Essential (primary) hypertension: Secondary | ICD-10-CM | POA: Diagnosis not present

## 2017-03-25 DIAGNOSIS — L89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, stage 4: Secondary | ICD-10-CM | POA: Diagnosis not present

## 2017-03-25 DIAGNOSIS — Z923 Personal history of irradiation: Secondary | ICD-10-CM | POA: Insufficient documentation

## 2017-03-25 DIAGNOSIS — F0390 Unspecified dementia without behavioral disturbance: Secondary | ICD-10-CM | POA: Insufficient documentation

## 2017-03-25 DIAGNOSIS — G8221 Paraplegia, complete: Secondary | ICD-10-CM | POA: Diagnosis not present

## 2017-04-14 DIAGNOSIS — L89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, stage 4: Secondary | ICD-10-CM | POA: Diagnosis not present

## 2017-04-14 DIAGNOSIS — N319 Neuromuscular dysfunction of bladder, unspecified: Secondary | ICD-10-CM | POA: Diagnosis not present

## 2017-04-14 DIAGNOSIS — N390 Urinary tract infection, site not specified: Secondary | ICD-10-CM | POA: Diagnosis not present

## 2017-04-14 DIAGNOSIS — T83510D Infection and inflammatory reaction due to cystostomy catheter, subsequent encounter: Secondary | ICD-10-CM | POA: Diagnosis not present

## 2017-04-21 ENCOUNTER — Encounter (HOSPITAL_BASED_OUTPATIENT_CLINIC_OR_DEPARTMENT_OTHER): Payer: PPO | Attending: Internal Medicine

## 2017-04-21 DIAGNOSIS — L89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, stage 4: Secondary | ICD-10-CM | POA: Diagnosis not present

## 2017-04-21 DIAGNOSIS — I10 Essential (primary) hypertension: Secondary | ICD-10-CM | POA: Insufficient documentation

## 2017-04-21 DIAGNOSIS — Z923 Personal history of irradiation: Secondary | ICD-10-CM | POA: Diagnosis not present

## 2017-04-21 DIAGNOSIS — G8221 Paraplegia, complete: Secondary | ICD-10-CM | POA: Insufficient documentation

## 2017-04-21 DIAGNOSIS — L97409 Non-pressure chronic ulcer of unspecified heel and midfoot with unspecified severity: Secondary | ICD-10-CM | POA: Diagnosis not present

## 2017-04-21 DIAGNOSIS — L89892 Pressure ulcer of other site, stage 2: Secondary | ICD-10-CM | POA: Insufficient documentation

## 2017-04-21 DIAGNOSIS — L97122 Non-pressure chronic ulcer of left thigh with fat layer exposed: Secondary | ICD-10-CM | POA: Diagnosis not present

## 2017-04-21 DIAGNOSIS — L98412 Non-pressure chronic ulcer of buttock with fat layer exposed: Secondary | ICD-10-CM | POA: Diagnosis not present

## 2017-04-21 DIAGNOSIS — F0390 Unspecified dementia without behavioral disturbance: Secondary | ICD-10-CM | POA: Diagnosis not present

## 2017-04-24 DIAGNOSIS — M4712 Other spondylosis with myelopathy, cervical region: Secondary | ICD-10-CM | POA: Diagnosis not present

## 2017-04-24 DIAGNOSIS — F329 Major depressive disorder, single episode, unspecified: Secondary | ICD-10-CM | POA: Diagnosis not present

## 2017-04-24 DIAGNOSIS — M47816 Spondylosis without myelopathy or radiculopathy, lumbar region: Secondary | ICD-10-CM | POA: Diagnosis not present

## 2017-04-24 DIAGNOSIS — G894 Chronic pain syndrome: Secondary | ICD-10-CM | POA: Diagnosis not present

## 2017-04-26 ENCOUNTER — Encounter (HOSPITAL_COMMUNITY): Payer: Self-pay | Admitting: Emergency Medicine

## 2017-04-26 ENCOUNTER — Emergency Department (HOSPITAL_COMMUNITY): Payer: PPO

## 2017-04-26 ENCOUNTER — Other Ambulatory Visit: Payer: Self-pay

## 2017-04-26 ENCOUNTER — Inpatient Hospital Stay (HOSPITAL_COMMUNITY)
Admission: EM | Admit: 2017-04-26 | Discharge: 2017-04-30 | DRG: 698 | Disposition: A | Discharge status: Home Health | Payer: PPO | Attending: Family Medicine | Admitting: Family Medicine

## 2017-04-26 DIAGNOSIS — E119 Type 2 diabetes mellitus without complications: Secondary | ICD-10-CM

## 2017-04-26 DIAGNOSIS — Z853 Personal history of malignant neoplasm of breast: Secondary | ICD-10-CM

## 2017-04-26 DIAGNOSIS — T83518A Infection and inflammatory reaction due to other urinary catheter, initial encounter: Secondary | ICD-10-CM | POA: Diagnosis not present

## 2017-04-26 DIAGNOSIS — Y846 Urinary catheterization as the cause of abnormal reaction of the patient, or of later complication, without mention of misadventure at the time of the procedure: Secondary | ICD-10-CM | POA: Diagnosis present

## 2017-04-26 DIAGNOSIS — E876 Hypokalemia: Secondary | ICD-10-CM | POA: Diagnosis not present

## 2017-04-26 DIAGNOSIS — E7800 Pure hypercholesterolemia, unspecified: Secondary | ICD-10-CM | POA: Diagnosis not present

## 2017-04-26 DIAGNOSIS — Z90710 Acquired absence of both cervix and uterus: Secondary | ICD-10-CM

## 2017-04-26 DIAGNOSIS — IMO0002 Reserved for concepts with insufficient information to code with codable children: Secondary | ICD-10-CM

## 2017-04-26 DIAGNOSIS — K219 Gastro-esophageal reflux disease without esophagitis: Secondary | ICD-10-CM | POA: Diagnosis not present

## 2017-04-26 DIAGNOSIS — R8290 Unspecified abnormal findings in urine: Secondary | ICD-10-CM | POA: Diagnosis not present

## 2017-04-26 DIAGNOSIS — E1169 Type 2 diabetes mellitus with other specified complication: Secondary | ICD-10-CM | POA: Diagnosis not present

## 2017-04-26 DIAGNOSIS — A419 Sepsis, unspecified organism: Secondary | ICD-10-CM | POA: Diagnosis not present

## 2017-04-26 DIAGNOSIS — I10 Essential (primary) hypertension: Secondary | ICD-10-CM | POA: Diagnosis present

## 2017-04-26 DIAGNOSIS — Z87891 Personal history of nicotine dependence: Secondary | ICD-10-CM

## 2017-04-26 DIAGNOSIS — N179 Acute kidney failure, unspecified: Secondary | ICD-10-CM | POA: Diagnosis present

## 2017-04-26 DIAGNOSIS — D638 Anemia in other chronic diseases classified elsewhere: Secondary | ICD-10-CM | POA: Diagnosis present

## 2017-04-26 DIAGNOSIS — Z7984 Long term (current) use of oral hypoglycemic drugs: Secondary | ICD-10-CM

## 2017-04-26 DIAGNOSIS — G9341 Metabolic encephalopathy: Secondary | ICD-10-CM | POA: Diagnosis not present

## 2017-04-26 DIAGNOSIS — N200 Calculus of kidney: Secondary | ICD-10-CM | POA: Diagnosis not present

## 2017-04-26 DIAGNOSIS — Z96652 Presence of left artificial knee joint: Secondary | ICD-10-CM | POA: Diagnosis present

## 2017-04-26 DIAGNOSIS — E1165 Type 2 diabetes mellitus with hyperglycemia: Secondary | ICD-10-CM

## 2017-04-26 DIAGNOSIS — N390 Urinary tract infection, site not specified: Secondary | ICD-10-CM | POA: Insufficient documentation

## 2017-04-26 DIAGNOSIS — L89159 Pressure ulcer of sacral region, unspecified stage: Secondary | ICD-10-CM | POA: Diagnosis present

## 2017-04-26 DIAGNOSIS — T83510A Infection and inflammatory reaction due to cystostomy catheter, initial encounter: Secondary | ICD-10-CM | POA: Diagnosis not present

## 2017-04-26 DIAGNOSIS — Z79890 Hormone replacement therapy (postmenopausal): Secondary | ICD-10-CM | POA: Diagnosis not present

## 2017-04-26 DIAGNOSIS — R4182 Altered mental status, unspecified: Secondary | ICD-10-CM | POA: Diagnosis not present

## 2017-04-26 DIAGNOSIS — L89154 Pressure ulcer of sacral region, stage 4: Secondary | ICD-10-CM | POA: Diagnosis not present

## 2017-04-26 DIAGNOSIS — G9340 Encephalopathy, unspecified: Secondary | ICD-10-CM | POA: Diagnosis present

## 2017-04-26 DIAGNOSIS — E039 Hypothyroidism, unspecified: Secondary | ICD-10-CM | POA: Diagnosis present

## 2017-04-26 DIAGNOSIS — C50311 Malignant neoplasm of lower-inner quadrant of right female breast: Secondary | ICD-10-CM | POA: Diagnosis present

## 2017-04-26 DIAGNOSIS — G8220 Paraplegia, unspecified: Secondary | ICD-10-CM | POA: Diagnosis present

## 2017-04-26 DIAGNOSIS — M8668 Other chronic osteomyelitis, other site: Secondary | ICD-10-CM | POA: Diagnosis present

## 2017-04-26 LAB — CBC WITH DIFFERENTIAL/PLATELET
Basophils Absolute: 0 10*3/uL (ref 0.0–0.1)
Basophils Relative: 0 %
Eosinophils Absolute: 0 10*3/uL (ref 0.0–0.7)
Eosinophils Relative: 0 %
HEMATOCRIT: 46.6 % — AB (ref 36.0–46.0)
HEMOGLOBIN: 15.4 g/dL — AB (ref 12.0–15.0)
LYMPHS ABS: 0.8 10*3/uL (ref 0.7–4.0)
LYMPHS PCT: 5 %
MCH: 31.2 pg (ref 26.0–34.0)
MCHC: 33 g/dL (ref 30.0–36.0)
MCV: 94.5 fL (ref 78.0–100.0)
Monocytes Absolute: 0.8 10*3/uL (ref 0.1–1.0)
Monocytes Relative: 5 %
NEUTROS ABS: 15 10*3/uL — AB (ref 1.7–7.7)
NEUTROS PCT: 90 %
Platelets: 253 10*3/uL (ref 150–400)
RBC: 4.93 MIL/uL (ref 3.87–5.11)
RDW: 14.3 % (ref 11.5–15.5)
WBC: 16.5 10*3/uL — AB (ref 4.0–10.5)

## 2017-04-26 LAB — URINALYSIS, ROUTINE W REFLEX MICROSCOPIC
BILIRUBIN URINE: NEGATIVE
Glucose, UA: NEGATIVE mg/dL
KETONES UR: NEGATIVE mg/dL
NITRITE: NEGATIVE
Protein, ur: 30 mg/dL — AB
Specific Gravity, Urine: 1.015 (ref 1.005–1.030)
pH: 7 (ref 5.0–8.0)

## 2017-04-26 LAB — I-STAT CG4 LACTIC ACID, ED: Lactic Acid, Venous: 4.41 mmol/L (ref 0.5–1.9)

## 2017-04-26 LAB — COMPREHENSIVE METABOLIC PANEL
ALT: 27 U/L (ref 14–54)
AST: 39 U/L (ref 15–41)
Albumin: 4 g/dL (ref 3.5–5.0)
Alkaline Phosphatase: 68 U/L (ref 38–126)
Anion gap: 14 (ref 5–15)
BUN: 26 mg/dL — AB (ref 6–20)
CHLORIDE: 98 mmol/L — AB (ref 101–111)
CO2: 24 mmol/L (ref 22–32)
CREATININE: 1.01 mg/dL — AB (ref 0.44–1.00)
Calcium: 9.5 mg/dL (ref 8.9–10.3)
GFR calc Af Amer: >60 mL/min (ref >60)
GFR calc non Af Amer: 56 mL/min — ABNORMAL LOW (ref >60)
GLUCOSE: 160 mg/dL — AB (ref 65–99)
Potassium: 4.2 mmol/L (ref 3.5–5.1)
SODIUM: 136 mmol/L (ref 135–145)
Total Bilirubin: 0.7 mg/dL (ref 0.3–1.2)
Total Protein: 9.1 g/dL — ABNORMAL HIGH (ref 6.5–8.1)

## 2017-04-26 LAB — PROTIME-INR
INR: 1.04
Prothrombin Time: 13.5 seconds (ref 11.4–15.2)

## 2017-04-26 LAB — CBG MONITORING, ED: GLUCOSE-CAPILLARY: 179 mg/dL — AB (ref 65–99)

## 2017-04-26 MED ORDER — VANCOMYCIN HCL IN DEXTROSE 1-5 GM/200ML-% IV SOLN
1000.0000 mg | Freq: Once | INTRAVENOUS | Status: AC
Start: 2017-04-26 — End: 2017-04-26
  Administered 2017-04-26: 1000 mg via INTRAVENOUS
  Filled 2017-04-26: qty 200

## 2017-04-26 MED ORDER — VANCOMYCIN HCL IN DEXTROSE 750-5 MG/150ML-% IV SOLN
750.0000 mg | Freq: Two times a day (BID) | INTRAVENOUS | Status: DC
  Administered 2017-04-27 (×2): 750 mg via INTRAVENOUS
  Filled 2017-04-26 (×4): qty 150

## 2017-04-26 MED ORDER — ONDANSETRON HCL 4 MG/2ML IJ SOLN
4.0000 mg | Freq: Four times a day (QID) | INTRAMUSCULAR | Status: DC | PRN
  Administered 2017-04-28: 4 mg via INTRAVENOUS
  Filled 2017-04-26: qty 2

## 2017-04-26 MED ORDER — SODIUM CHLORIDE 0.9 % IV BOLUS (SEPSIS)
2700.0000 mL | Freq: Once | INTRAVENOUS | Status: AC
  Administered 2017-04-26: 2700 mL via INTRAVENOUS

## 2017-04-26 MED ORDER — PIPERACILLIN-TAZOBACTAM 3.375 G IVPB
3.3750 g | Freq: Three times a day (TID) | INTRAVENOUS | Status: DC
  Administered 2017-04-27 – 2017-04-28 (×4): 3.375 g via INTRAVENOUS
  Filled 2017-04-26 (×5): qty 50

## 2017-04-26 MED ORDER — PIPERACILLIN-TAZOBACTAM 3.375 G IVPB 30 MIN
3.3750 g | Freq: Once | INTRAVENOUS | Status: AC
  Administered 2017-04-26: 3.375 g via INTRAVENOUS
  Filled 2017-04-26: qty 50

## 2017-04-26 MED ORDER — INSULIN ASPART 100 UNIT/ML ~~LOC~~ SOLN: 0.0000 [IU] | SUBCUTANEOUS | Status: DC

## 2017-04-26 MED ORDER — SODIUM CHLORIDE 0.9 % IV SOLN
INTRAVENOUS | Status: AC
  Administered 2017-04-27 (×2): via INTRAVENOUS

## 2017-04-26 MED ORDER — HYDRALAZINE HCL 20 MG/ML IJ SOLN: 10.0000 mg | INTRAMUSCULAR | Status: DC | PRN

## 2017-04-26 MED ORDER — ACETAMINOPHEN 325 MG PO TABS
650.0000 mg | ORAL_TABLET | Freq: Four times a day (QID) | ORAL | Status: DC | PRN
  Administered 2017-04-27 – 2017-04-29 (×3): 650 mg via ORAL
  Filled 2017-04-26 (×3): qty 2

## 2017-04-26 MED ORDER — ACETAMINOPHEN 650 MG RE SUPP
650.0000 mg | Freq: Once | RECTAL | Status: AC
  Administered 2017-04-26: 650 mg via RECTAL

## 2017-04-26 MED ORDER — LEVOTHYROXINE SODIUM 100 MCG IV SOLR: 62.5000 ug | Freq: Every day | INTRAVENOUS | Status: DC

## 2017-04-26 MED ORDER — METOPROLOL TARTRATE 5 MG/5ML IV SOLN
5.0000 mg | Freq: Four times a day (QID) | INTRAVENOUS | Status: DC
  Filled 2017-04-26: qty 5

## 2017-04-26 MED ORDER — ONDANSETRON HCL 4 MG PO TABS: 4.0000 mg | ORAL_TABLET | Freq: Four times a day (QID) | ORAL | Status: DC | PRN

## 2017-04-26 MED ORDER — ACETAMINOPHEN 650 MG RE SUPP: 650.0000 mg | Freq: Four times a day (QID) | RECTAL | Status: DC | PRN

## 2017-04-26 MED ORDER — ENOXAPARIN SODIUM 40 MG/0.4ML ~~LOC~~ SOLN
40.0000 mg | Freq: Every day | SUBCUTANEOUS | Status: DC
  Administered 2017-04-28 – 2017-04-30 (×3): 40 mg via SUBCUTANEOUS
  Filled 2017-04-26 (×4): qty 0.4

## 2017-04-26 NOTE — H&P (Addendum)
History and Physical    [name] JAS:[id number] DOB: 02/23/1951 DOA: 04/26/2017  PCP: [name], MD  Patient coming from: Home.  Chief Complaint: Altered mental status.  HPI: [name] is a 67 y.o. female with history of paraplegia with suprapubic catheter and sacral decubitus ulcer, diabetes mellitus type 2, hypertension, hypothyroidism and chronic anemia and previous history of breast cancer in remission was brought to the ER by patient's husband as patient is found to be acutely confused.  Discussed with the husband who provided a history to the ER physician patient has been noticed to have increasing foul-smelling urine last 2 days.  Recently was treated by patient's urologist 2 weeks ago for UTI.  Patient did not have any chest pain shortness of breath productive cough nausea vomiting or diarrhea.  Since patient was found to have altered mental status patient was brought to the ER.  ED Course: In the ER patient's temperature was found to be around 105 F lactate was around 4 patient was tachycardic confused.  Patient was started on sepsis protocol and blood cultures obtained urine cultures and UA was consistent with UTI.  Chest x-ray was unremarkable.  Patient was given fluid bolus for sepsis protocol and started on empiric antibiotics.  By the time I examined the patient patient became more alert awake states that she is here because she was not doing well but does not recall exactly how she reached here.  States her abdomen feels slightly distended but denies any pain.  Denies any chest pain or shortness of breath.  Review of Systems: As per HPI, rest all negative.   Past Medical History:  Diagnosis Date  . Anemia   . Arthritis    "all over" (04/13/2012)  . Cancer of right breast ([place]) 2011  . Cervical neuropathy    PERIPHERAL NEUROPATHY , HAD CERVICAL FUSION  . Diabetes mellitus without complication ([place])   . Difficult intubation    Fastrach #4 LMA then # 7 ETT used in  2006 [facility], cervical laminectomy)  . GERD (gastroesophageal reflux disease)   . History of kidney stones   . Hypercholesteremia   . Hypertension   . Hypothyroidism   . Kidney stones 1990's   "twice; passed in hospital on my own" (04/13/2012)  . Kidney stones 08/10/2015  . Paraplegia ([place])   . PONV (postoperative nausea and vomiting)   . Sacral decubitus ulcer 10/2014    Past Surgical History:  Procedure Laterality Date  . ABDOMINAL HYSTERECTOMY  ~ 1976  . BACK SURGERY    . BREAST BIOPSY Right 2011  . BREAST LUMPECTOMY Right 2011  . CALDWELL LUC  ~ 2003   "benign tumor removed from up under gum" (04/13/2012)  . DACROCYSTORHINOSTOMY  ~ 2000   "put stents in my tear ducts; both eyes" (04/13/2012)  . EYE SURGERY  2004   STENTS TO BIL EYES  . I&D EXTREMITY Bilateral 11/03/2014   Procedure: IRRIGATION AND DEBRIDEMENT BILATERAL HEEL;  Surgeon: Irene Limbo, MD;  Location: [place];  Service: Plastics;  Laterality: Bilateral;  . INCISION AND DRAINAGE OF WOUND Left 11/03/2014   Procedure: IRRIGATION AND DEBRIDEMENT SACRAL ULCER;  Surgeon: Irene Limbo, MD;  Location: [facility];  Service: Plastics;  Laterality: Left;  . JOINT REPLACEMENT    . POSTERIOR CERVICAL LAMINECTOMY  2006  . SKIN SPLIT GRAFT Left 08/05/2016   Procedure: SURGICAL PREP  FOR GRAFTING APPLICATION ACELL TO LEFT ISCHIUM;  Surgeon: Irene Limbo, MD;  Location: [facility];  Service: Plastics;  Laterality: Left;  . TONSILLECTOMY AND ADENOIDECTOMY  ~ 1954  . TOTAL KNEE ARTHROPLASTY WITH REVISION COMPONENTS  04/13/2012   Procedure: TOTAL KNEE ARTHROPLASTY WITH REVISION COMPONENTS;  Surgeon: Hessie Dibble, MD;  Location: [place];  Service: Orthopedics;  Laterality: Left;     reports that she quit smoking about 2 years ago. Her smoking use included cigarettes. She has a 25.00 pack-year smoking history. she has never used smokeless tobacco. She reports that she does not drink alcohol or use drugs.  Allergies  Allergen Reactions  .  Ivp Dye [Iodinated Diagnostic Agents] Other (See Comments)    "hot and sweaty and almost passed out"  . Aspirin Hives  . Sulfa Antibiotics Hives    Family History  Problem Relation Age of Onset  . Heart disease Father     Prior to Admission medications   Medication Sig Start Date End Date Taking? Authorizing Provider  ascorbic acid (VITAMIN C) 500 MG tablet Take 1 tablet (500 mg total) by mouth daily. 11/07/14   Francesca Oman, DO  baclofen (LIORESAL) 20 MG tablet Take 20 mg by mouth 4 (four) times daily. Take 1 tablet (20 mg) by mouth 4 times daily - 7am, 12pm, 4pm, 8pm 09/21/15   [provider]  docusate sodium (COLACE) 100 MG capsule Take 100 mg by mouth daily as needed (FOR CONSTIPATION).     [provider]  DULoxetine (CYMBALTA) 60 MG capsule Take 60 mg by mouth 2 (two) times daily.     [provider]  ferrous sulfate 325 (65 FE) MG tablet Take 325 mg by mouth daily after supper.     [provider]  folic acid (FOLVITE) 1 MG tablet Take 1 mg by mouth daily after supper.     [provider]  furosemide (LASIX) 40 MG tablet Take 40 mg by mouth daily after supper.     [provider]  gabapentin (NEURONTIN) 800 MG tablet Take 1,600 mg by mouth 2 (two) times daily. 09/22/14   [provider]  levothyroxine (SYNTHROID, LEVOTHROID) 125 MCG tablet Take 1 tablet (125 mcg total) by mouth daily before breakfast. Patient taking differently: Take 125 mcg by mouth daily after supper.  11/07/14   Francesca Oman, DO  metFORMIN (GLUCOPHAGE) 1000 MG tablet Take 1,000 mg by mouth 2 (two) times daily. 07/01/16   [provider]  metoprolol (LOPRESSOR) 100 MG tablet Take 100 mg by mouth 2 (two) times daily.  10/28/14   [provider]  morphine (MS CONTIN) 30 MG 12 hr tablet Take 30 mg by mouth 3 (three) times daily. Take 1 tablet (30 mg) by mouth daily at 10am, 12pm and 8pm 08/06/15   [provider]  Multiple Vitamin  (MULTIVITAMIN WITH MINERALS) TABS tablet Take 1 tablet by mouth daily. 11/07/14   Francesca Oman, DO  NIFEdipine (PROCARDIA XL/ADALAT-CC) 30 MG 24 hr tablet Take 30 mg by mouth daily after supper.     [provider]  oxyCODONE-acetaminophen (PERCOCET) 10-325 MG per tablet Take 1-2 tablets by mouth 3 (three) times daily. Take 1 tablet by mouth daily at 7am, 2 tablets at 10am and 4pm 10/10/14   [provider]  pantoprazole (PROTONIX) 40 MG tablet Take 1 tablet (40 mg total) by mouth daily. Patient taking differently: Take 40 mg by mouth daily as needed (for acid reflux).  11/07/14   Francesca Oman, DO  zinc sulfate 220 MG capsule Take 1 capsule (220 mg total) by mouth  daily. 11/07/14   Francesca Oman, DO    Physical Exam: Vitals:   04/26/17 2300 04/26/17 2315 04/26/17 2330 04/26/17 2335  BP: (!) 157/86 (!) 157/87 (!) 158/78   Pulse: (!) 117 (!) 113 (!) 116   Resp: (!) 21 14 15    Temp:    (!) 102.6 F (39.2 C)  TempSrc:    Rectal  SpO2: 94% 96% 97%   Weight:      Height:          Constitutional: Moderately built and nourished.  Vitals:   04/26/17 2300 04/26/17 2315 04/26/17 2330 04/26/17 2335  BP: (!) 157/86 (!) 157/87 (!) 158/78   Pulse: (!) 117 (!) 113 (!) 116   Resp: (!) 21 14 15    Temp:    (!) 102.6 F (39.2 C)  TempSrc:    Rectal  SpO2: 94% 96% 97%   Weight:      Height:       Eyes: Anicteric no pallor. ENMT: No discharge from the ears eyes nose or mouth. Neck: No mass felt.  No neck rigidity. Respiratory: No rhonchi or crepitations. Cardiovascular: S1-S2 heard tachycardic. Abdomen: Soft nontender bowel sounds present. Musculoskeletal: No edema.  No joint effusion. Skin: Patient has sacral decubitus ulcer which is difficult to examine at this position. Neurologic: Patient has become alert awake oriented at the time of my exam.  Moves both upper extremities has paraplegia. Psychiatric: Appears normal.   Labs on Admission: I have personally reviewed  following labs and imaging studies  CBC: Recent Labs  Lab 04/26/17 2207  WBC 16.5*  NEUTROABS 15.0*  HGB 15.4*  HCT 46.6*  MCV 94.5  PLT 097   Basic Metabolic Panel: Recent Labs  Lab 04/26/17 2207  NA 136  K 4.2  CL 98*  CO2 24  GLUCOSE 160*  BUN 26*  CREATININE 1.01*  CALCIUM 9.5   GFR: Estimated Creatinine Clearance: 53.2 mL/min (A) (by C-G formula based on SCr of 1.01 mg/dL (H)). Liver Function Tests: Recent Labs  Lab 04/26/17 2207  AST 39  ALT 27  ALKPHOS 68  BILITOT 0.7  PROT 9.1*  ALBUMIN 4.0   No results for input(s): LIPASE, AMYLASE in the last 168 hours. No results for input(s): AMMONIA in the last 168 hours. Coagulation Profile: Recent Labs  Lab 04/26/17 2207  INR 1.04   Cardiac Enzymes: No results for input(s): CKTOTAL, CKMB, CKMBINDEX, TROPONINI in the last 168 hours. BNP (last 3 results) No results for input(s): PROBNP in the last 8760 hours. HbA1C: No results for input(s): HGBA1C in the last 72 hours. CBG: Recent Labs  Lab 04/26/17 2138  GLUCAP 179*   Lipid Profile: No results for input(s): CHOL, HDL, LDLCALC, TRIG, CHOLHDL, LDLDIRECT in the last 72 hours. Thyroid Function Tests: No results for input(s): TSH, T4TOTAL, FREET4, T3FREE, THYROIDAB in the last 72 hours. Anemia Panel: No results for input(s): VITAMINB12, FOLATE, FERRITIN, TIBC, IRON, RETICCTPCT in the last 72 hours. Urine analysis:    Component Value Date/Time   COLORURINE YELLOW 04/26/2017 2217   APPEARANCEUR HAZY (A) 04/26/2017 2217   LABSPEC 1.015 04/26/2017 2217   PHURINE 7.0 04/26/2017 2217   GLUCOSEU NEGATIVE 04/26/2017 2217   HGBUR MODERATE (A) 04/26/2017 2217   BILIRUBINUR NEGATIVE 04/26/2017 2217   [place] NEGATIVE 04/26/2017 2217   PROTEINUR 30 (A) 04/26/2017 2217   UROBILINOGEN 1.0 11/01/2014 1127   NITRITE NEGATIVE 04/26/2017 2217   LEUKOCYTESUR LARGE (A) 04/26/2017 2217   Sepsis Labs: @LABRCNTIP (procalcitonin:4,lacticidven:4) )No results found  for this or any previous visit (from the past 240 hour(s)).   Radiological Exams on Admission: Dg Chest Port 1 View  Result Date: 04/26/2017 CLINICAL DATA:  Abnormal urine. EXAM: PORTABLE CHEST 1 VIEW COMPARISON:  October 30, 2015 FINDINGS: The heart size and mediastinal contours are within normal limits. Both lungs are clear. The visualized skeletal structures are unremarkable. IMPRESSION: No active disease. Electronically Signed   By: Dorise Bullion III M.D   On: 04/26/2017 22:25     Assessment/Plan Principal Problem:   Sepsis ([place]) Active Problems:   Breast cancer of lower-inner quadrant of right female breast ([place])   Hypothyroidism   Paraplegia ([place])   Sacral decubitus ulcer   AKI (acute kidney injury) ([place])   Encephalopathy acute   Hypertension   Diabetes mellitus type 2, controlled ([place])    1. Sepsis probably secondary to UTI -patient is placed on empiric antibiotics.  Follow cultures.  Continue with aggressive hydration follow lactate pro calcitonin levels.  Will check CT abdomen and pelvis to rule out any hydronephrosis and also at the same time I would look at the pelvic bones to rule out any osteomyelitis or abscess. 2. Acute encephalopathy likely from sepsis which is improved. 3. Hypertension on Procardia and metoprolol. 4. Diabetes mellitus type 2 we will hold metformin and keep patient on sliding scale coverage. 5. Sacral decubitus ulcers stage IV -was treated on antibiotics for 6 weeks in 2017 for cellulitis.  Follow CT abdomen and pelvis results. 6. Hypothyroidism on Synthroid. 7. Chronic anemia follow CBC. 8. History of breast cancer in remission.  Addendum -patient's third lactate level came at 6.  This is increased from 3.  Patient blood pressure still around 169 systolic.  On exam patient is not in distress temperature is elevated.  Discussed with critical care.  They are advised to go to monitor bolus and repeat lactate.  If not showing a downward trend then to  reconsult critical care.  DVT prophylaxis: Lovenox. Code Status: Full code. Family Communication: No family at the bedside. Disposition Plan: To be determined. Consults called: Discussed with critical care. Admission status: Inpatient.   Rise Patience MD [facility] Pager [phone].  If 7PM-7AM, please contact night-coverage [email or web address] Password [facility]  04/26/2017, 11:51 PM

## 2017-04-26 NOTE — ED Notes (Signed)
After first bag of NS pt became verbal.  A&O x 4 at this time.

## 2017-04-26 NOTE — ED Triage Notes (Addendum)
Per Family pt was last "ok" around 5p today.  Pt has a suprapubic catheter and a pressure ulcer on the right buttock per family. Pt currently slumped over in wheelchair unable to maintain posture, only alert to self.  Family states none of this is baseline for this pt and "her urine has smelled really bad lately"

## 2017-04-26 NOTE — ED Provider Notes (Signed)
[facility] EMERGENCY DEPARTMENT Provider Note   CSN: [id number] Arrival date & time: 04/26/17  2100     History   Chief Complaint Chief Complaint  Patient presents with  . Code Sepsis    HPI [name] is a 67 y.o. female.  HPI  The patient is a 67 year old female, she is chronically ill with a history of breast cancer, hypertension, paraplegia, she has a deep left ischial sacral decubitus ulcer and that she has a diverting suprapubic catheter.  The husband states that over the last couple of days she has developed some dark foul-smelling urine but this evening she became acutely altered.  He reports that she is severely weak and unable to communicate, level 5 caveat applies secondary to the change in mental status.  Evidently there is been no coughing, no vomiting, no diarrhea.  Past Medical History:  Diagnosis Date  . Anemia   . Arthritis    "all over" (04/13/2012)  . Cancer of right breast ([place]) 2011  . Cervical neuropathy    PERIPHERAL NEUROPATHY , HAD CERVICAL FUSION  . Diabetes mellitus without complication ([place])   . Difficult intubation    Fastrach #4 LMA then # 7 ETT used in 2006 [facility], cervical laminectomy)  . GERD (gastroesophageal reflux disease)   . History of kidney stones   . Hypercholesteremia   . Hypertension   . Hypothyroidism   . Kidney stones 1990's   "twice; passed in hospital on my own" (04/13/2012)  . Kidney stones 08/10/2015  . Paraplegia ([place])   . PONV (postoperative nausea and vomiting)   . Sacral decubitus ulcer 10/2014    Patient Active Problem List   Diagnosis Date Noted  . Medication monitoring encounter 12/03/2015  . Hypertension   . History of nephrolithiasis   . AKI (acute kidney injury) ([place]) 08/10/2015  . Sepsis ([place]) 08/10/2015  . Hypotension 08/10/2015  . Altered mental status 08/10/2015  . Leukocytosis 08/10/2015  . Kidney stones 08/10/2015  . Acute kidney injury ([place])   . Encephalopathy acute   .  Osteomyelitis ([place]) 11/06/2014  . Sacral decubitus ulcer   . Paraplegia ([place]) 11/01/2014  . Sacral ulcer ([place]) 10/31/2014  . Hypothyroidism 10/31/2014  . Breast cancer of lower-inner quadrant of right female breast ([place]) 12/22/2012  . HTN (hypertension) 04/15/2012  . Left knee DJD 04/13/2012    Class: Chronic    Past Surgical History:  Procedure Laterality Date  . ABDOMINAL HYSTERECTOMY  ~ 1976  . BACK SURGERY    . BREAST BIOPSY Right 2011  . BREAST LUMPECTOMY Right 2011  . CALDWELL LUC  ~ 2003   "benign tumor removed from up under gum" (04/13/2012)  . DACROCYSTORHINOSTOMY  ~ 2000   "put stents in my tear ducts; both eyes" (04/13/2012)  . EYE SURGERY  2004   STENTS TO BIL EYES  . I&D EXTREMITY Bilateral 11/03/2014   Procedure: IRRIGATION AND DEBRIDEMENT BILATERAL HEEL;  Surgeon: Irene Limbo, MD;  Location: [facility];  Service: Plastics;  Laterality: Bilateral;  . INCISION AND DRAINAGE OF WOUND Left 11/03/2014   Procedure: IRRIGATION AND DEBRIDEMENT SACRAL ULCER;  Surgeon: Irene Limbo, MD;  Location: [facility];  Service: Plastics;  Laterality: Left;  . JOINT REPLACEMENT    . POSTERIOR CERVICAL LAMINECTOMY  2006  . SKIN SPLIT GRAFT Left 08/05/2016   Procedure: SURGICAL PREP  FOR GRAFTING APPLICATION ACELL TO LEFT ISCHIUM;  Surgeon: Irene Limbo, MD;  Location: [facility];  Service: Plastics;  Laterality: Left;  .  TONSILLECTOMY AND ADENOIDECTOMY  ~ 1954  . TOTAL KNEE ARTHROPLASTY WITH REVISION COMPONENTS  04/13/2012   Procedure: TOTAL KNEE ARTHROPLASTY WITH REVISION COMPONENTS;  Surgeon: Hessie Dibble, MD;  Location: [facility];  Service: Orthopedics;  Laterality: Left;    OB History    No data available       Home Medications    Prior to Admission medications   Medication Sig Start Date End Date Taking? Authorizing Provider  ascorbic acid (VITAMIN C) 500 MG tablet Take 1 tablet (500 mg total) by mouth daily. 11/07/14   Francesca Oman, DO  baclofen (LIORESAL) 20 MG tablet Take 20 mg by  mouth 4 (four) times daily. Take 1 tablet (20 mg) by mouth 4 times daily - 7am, 12pm, 4pm, 8pm 09/21/15   [provider]  docusate sodium (COLACE) 100 MG capsule Take 100 mg by mouth daily as needed (FOR CONSTIPATION).     [provider]  DULoxetine (CYMBALTA) 60 MG capsule Take 60 mg by mouth 2 (two) times daily.     [provider]  ferrous sulfate 325 (65 FE) MG tablet Take 325 mg by mouth daily after supper.     [provider]  folic acid (FOLVITE) 1 MG tablet Take 1 mg by mouth daily after supper.     [provider]  furosemide (LASIX) 40 MG tablet Take 40 mg by mouth daily after supper.     [provider]  gabapentin (NEURONTIN) 800 MG tablet Take 1,600 mg by mouth 2 (two) times daily. 09/22/14   [provider]  levothyroxine (SYNTHROID, LEVOTHROID) 125 MCG tablet Take 1 tablet (125 mcg total) by mouth daily before breakfast. Patient taking differently: Take 125 mcg by mouth daily after supper.  11/07/14   Francesca Oman, DO  metFORMIN (GLUCOPHAGE) 1000 MG tablet Take 1,000 mg by mouth 2 (two) times daily. 07/01/16   [provider]  metoprolol (LOPRESSOR) 100 MG tablet Take 100 mg by mouth 2 (two) times daily.  10/28/14   [provider]  morphine (MS CONTIN) 30 MG 12 hr tablet Take 30 mg by mouth 3 (three) times daily. Take 1 tablet (30 mg) by mouth daily at 10am, 12pm and 8pm 08/06/15   [provider]  Multiple Vitamin (MULTIVITAMIN WITH MINERALS) TABS tablet Take 1 tablet by mouth daily. 11/07/14   Francesca Oman, DO  NIFEdipine (PROCARDIA XL/ADALAT-CC) 30 MG 24 hr tablet Take 30 mg by mouth daily after supper.     [provider]  oxyCODONE-acetaminophen (PERCOCET) 10-325 MG per tablet Take 1-2 tablets by mouth 3 (three) times daily. Take 1 tablet by mouth daily at 7am, 2 tablets at 10am and 4pm 10/10/14   [provider]  pantoprazole (PROTONIX) 40 MG tablet Take 1 tablet (40 mg total) by  mouth daily. Patient taking differently: Take 40 mg by mouth daily as needed (for acid reflux).  11/07/14   Francesca Oman, DO  zinc sulfate 220 MG capsule Take 1 capsule (220 mg total) by mouth daily. 11/07/14   Francesca Oman, DO    Family History Family History  Problem Relation Age of Onset  . Heart disease Father     Social History Social History   Tobacco Use  . Smoking status: Former Smoker    Packs/day: 0.50    Years: 50.00    Pack years: 25.00    Types: Cigarettes    Last attempt to quit: 11/06/2014    Years since  quitting: 2.4  . Smokeless tobacco: Never Used  Substance Use Topics  . Alcohol use: No  . Drug use: No     Allergies   Ivp dye [iodinated diagnostic agents]; Aspirin; and Sulfa antibiotics   Review of Systems Review of Systems  Unable to perform ROS: Mental status change     Physical Exam Updated Vital Signs BP (!) 172/84   Pulse (!) 122   Temp (!) 105.7 F (40.9 C) (Rectal) Comment: Dr. Sabra Heck is aware  Resp 20   SpO2 95%   Physical Exam  Constitutional: She appears well-developed and well-nourished. She appears distressed.  HENT:  Head: Normocephalic and atraumatic.  Mouth/Throat: Oropharynx is clear and moist. No oropharyngeal exudate.  Eyes: Conjunctivae are normal. Right eye exhibits no discharge. Left eye exhibits no discharge. No scleral icterus.  Neck: Normal range of motion. Neck supple. No JVD present. No thyromegaly present.  Cardiovascular: Regular rhythm, normal heart sounds and intact distal pulses. Exam reveals no gallop and no friction rub.  No murmur heard. Tachycardic to 125 bpm  Pulmonary/Chest: Effort normal and breath sounds normal. No respiratory distress. She has no wheezes. She has no rales.  Abdominal: Soft. Bowel sounds are normal. She exhibits no distension and no mass. There is no tenderness.  Prepubic catheter in place, urine appears yellow, not particularly cloudy  Musculoskeletal: Normal range of motion. She  exhibits no edema or tenderness.  Ischial open decubitus left buttock, no significant drainage surrounding redness or induration, mild foul smell  Lymphadenopathy:    She has no cervical adenopathy.  Neurological: She is alert. Coordination normal.  Altered, able to answer in 1 or 2 word answers, does not open her eyes, is able to grip with both hands but very weak.  Skin: Skin is warm and dry. No rash noted. No erythema.  Psychiatric: She has a normal mood and affect. Her behavior is normal.  Nursing note and vitals reviewed.    ED Treatments / Results  Labs (all labs ordered are listed, but only abnormal results are displayed) Labs Reviewed  CBG MONITORING, ED - Abnormal; Notable for the following components:      Result Value   Glucose-Capillary 179 (*)    All other components within normal limits  CULTURE, BLOOD (ROUTINE X 2)  CULTURE, BLOOD (ROUTINE X 2)  URINE CULTURE  COMPREHENSIVE METABOLIC PANEL  CBC WITH DIFFERENTIAL/PLATELET  PROTIME-INR  URINALYSIS, ROUTINE W REFLEX MICROSCOPIC  I-STAT CG4 LACTIC ACID, ED  I-STAT CG4 LACTIC ACID, ED    EKG  EKG Interpretation  Date/Time:  Sunday April 26 2017 22:05:20 EST Ventricular Rate:  121 PR Interval:    QRS Duration: 82 QT Interval:  294 QTC Calculation: 418 R Axis:   13 Text Interpretation:  Sinus tachycardia Borderline T abnormalities, lateral leads since 7/17, no sig changes Confirmed by Noemi Chapel [phone]) on 04/26/2017 10:32:50 PM       Radiology No results found.  Procedures .Critical Care Performed by: Noemi Chapel, MD Authorized by: Noemi Chapel, MD   Critical care provider statement:    Critical care time (minutes):  35   Critical care time was exclusive of:  Separately billable procedures and treating other patients and teaching time   Critical care was necessary to treat or prevent imminent or life-threatening deterioration of the following conditions:  Sepsis   Critical care was time spent  personally by me on the following activities:  Blood draw for specimens, development of treatment plan with patient or surrogate,  discussions with consultants, evaluation of patient's response to treatment, examination of patient, obtaining history from patient or surrogate, ordering and performing treatments and interventions, ordering and review of laboratory studies, ordering and review of radiographic studies, pulse oximetry, re-evaluation of patient's condition and review of old charts   (including critical care time)  Medications Ordered in ED Medications  acetaminophen (TYLENOL) suppository 650 mg (not administered)  sodium chloride 0.9 % bolus 2,700 mL (not administered)  piperacillin-tazobactam (ZOSYN) IVPB 3.375 g (not administered)  vancomycin (VANCOCIN) IVPB 1000 mg/200 mL premix (not administered)     Initial Impression / Assessment and Plan / ED Course  I have reviewed the triage vital signs and the nursing notes.  Pertinent labs & imaging results that were available during my care of the patient were reviewed by me and considered in my medical decision making (see chart for details).  Clinical Course as of Apr 26 2322  Sun Apr 26, 2017  2219 Lactic acid of 4.41, 30 cc/kg have been ordered at 2700 cc of normal saline.  Tylenol has been given per rectum, broad-spectrum antibiotics have been ordered, cultures have been ordered, chest x-ray has been performed, urinalysis has been obtained. Lactic Acid, Venous: (!!) 4.41 [BM]  2305 Leukocytes, UA: (!) LARGE [BM]  2305 RBC / HPF: TOO NUMEROUS TO COUNT [BM]  2305 WBC, UA: TOO NUMEROUS TO COUNT [BM]  2305 Bacteria, UA: (!) MANY [BM]  2306 Creatinine: (!) 1.01 [BM]  2306 WBC: (!) 16.5 [BM]  2306 Urinary tract infection is likely source given appearance of urine with many bacteria, too numerous to count white blood cells, white blood cell count is up to 16,000.  At this time hospitalist will be paged. NEUT#: (!) 15.0 [BM]  2324  Discussed with Dr. Hal Hope, he will admit to the hospital.  The patient is septic  [BM]    Clinical Course User Index [BM] Noemi Chapel, MD   Was last changed 3 weeks ago, the patient does appear septic from some source though it is not very clear on exam.  Given that she has not had any pulmonary symptoms it makes that less likely, this could be skin and soft tissue or it could be related to the urine.  I do not have any significant tenderness when I palpate around her decubitus ulcer there is no subcutaneous emphysema or foul drainage.  Broad-spectrum antibiotics, Tylenol for her severe fever of 105.7, IV fluids because she is likely in septic shock.  Sepsis - Repeat Assessment  Performed at:    11:06 PM   Vitals     Blood pressure (!) 157/86, pulse (!) 117, temperature (!) 105.7 F (40.9 C), temperature source Rectal, resp. rate (!) 21, height 5\' 3"  (1.6 m), weight 79.4 kg (175 lb 0.7 oz), SpO2 94 %.  Heart:     Tachycardic  Lungs:    CTA  Capillary Refill:   <2 sec  Peripheral Pulse:   Radial pulse palpable  Skin:     Normal Color     Final Clinical Impressions(s) / ED Diagnoses   Final diagnoses:  Sepsis, due to unspecified organism [facility])  Urinary tract infection associated with cystostomy catheter, initial encounter ([place])      Noemi Chapel, MD 04/26/17 2324

## 2017-04-26 NOTE — ED Notes (Signed)
RN notified of sepsis  

## 2017-04-26 NOTE — Progress Notes (Signed)
Pharmacy Antibiotic Note  [name] is a 67 y.o. female admitted on 04/26/2017 with sepsis.  Pharmacy has been consulted for vancomycin and Zosyn dosing.  Temp in the ED 105.7. Vancomycin 1g IV and Zosyn 3.375g IV x1 ordered by EDP.  Labs and BCx have been collected  Plan: Follow up labs and enter maintenance doses as appropriate   Height: 5\' 3"  (160 cm) Weight: 175 lb 0.7 oz (79.4 kg) IBW/kg (Calculated) : 52.4  Temp (24hrs), Avg:102.1 F (38.9 C), Min:98.4 F (36.9 C), Max:105.7 F (40.9 C)  No results for input(s): WBC, CREATININE, LATICACIDVEN, VANCOTROUGH, VANCOPEAK, VANCORANDOM, GENTTROUGH, GENTPEAK, GENTRANDOM, TOBRATROUGH, TOBRAPEAK, TOBRARND, AMIKACINPEAK, AMIKACINTROU, AMIKACIN in the last 168 hours.  CrCl cannot be calculated (Patient's most recent lab result is older than the maximum 21 days allowed.).    Allergies  Allergen Reactions  . Ivp Dye [Iodinated Diagnostic Agents] Other (See Comments)    "hot and sweaty and almost passed out"  . Aspirin Hives  . Sulfa Antibiotics Hives    Antimicrobials this admission: Vancomycin 1/20 >>  Zosyn 1/20 >>   Dose adjustments this admission: n/a  Microbiology results: 1/20 BCx:  1/20 UCx:     Thank you for allowing pharmacy to be a part of this patient's care.  Renise Gillies 04/26/2017 10:13 PM

## 2017-04-27 ENCOUNTER — Inpatient Hospital Stay (HOSPITAL_COMMUNITY): Payer: PPO

## 2017-04-27 LAB — C-REACTIVE PROTEIN: CRP: 18.8 mg/dL — ABNORMAL HIGH (ref <1.0)

## 2017-04-27 LAB — CBG MONITORING, ED
GLUCOSE-CAPILLARY: 102 mg/dL — AB (ref 65–99)
GLUCOSE-CAPILLARY: 169 mg/dL — AB (ref 65–99)
Glucose-Capillary: 115 mg/dL — ABNORMAL HIGH (ref 65–99)
Glucose-Capillary: 125 mg/dL — ABNORMAL HIGH (ref 65–99)

## 2017-04-27 LAB — CBC WITH DIFFERENTIAL/PLATELET
Basophils Absolute: 0 10*3/uL (ref 0.0–0.1)
Basophils Relative: 0 %
EOS PCT: 0 %
Eosinophils Absolute: 0 10*3/uL (ref 0.0–0.7)
HEMATOCRIT: 54.9 % — AB (ref 36.0–46.0)
Hemoglobin: 17.5 g/dL — ABNORMAL HIGH (ref 12.0–15.0)
LYMPHS ABS: 1 10*3/uL (ref 0.7–4.0)
LYMPHS PCT: 6 %
MCH: 30.5 pg (ref 26.0–34.0)
MCHC: 31.9 g/dL (ref 30.0–36.0)
MCV: 95.6 fL (ref 78.0–100.0)
MONO ABS: 1.8 10*3/uL — AB (ref 0.1–1.0)
Monocytes Relative: 10 %
NEUTROS ABS: 14.4 10*3/uL — AB (ref 1.7–7.7)
Neutrophils Relative %: 84 %
PLATELETS: 157 10*3/uL (ref 150–400)
RBC: 5.74 MIL/uL — ABNORMAL HIGH (ref 3.87–5.11)
RDW: 14.4 % (ref 11.5–15.5)
WBC: 17.2 10*3/uL — ABNORMAL HIGH (ref 4.0–10.5)

## 2017-04-27 LAB — PROCALCITONIN: Procalcitonin: 4.24 ng/mL

## 2017-04-27 LAB — CBC
HCT: 44.3 % (ref 36.0–46.0)
Hemoglobin: 14.4 g/dL (ref 12.0–15.0)
MCH: 30.8 pg (ref 26.0–34.0)
MCHC: 32.5 g/dL (ref 30.0–36.0)
MCV: 94.9 fL (ref 78.0–100.0)
Platelets: 175 10*3/uL (ref 150–400)
RBC: 4.67 MIL/uL (ref 3.87–5.11)
RDW: 14.2 % (ref 11.5–15.5)
WBC: 16.3 10*3/uL — ABNORMAL HIGH (ref 4.0–10.5)

## 2017-04-27 LAB — COMPREHENSIVE METABOLIC PANEL
ALT: 25 U/L (ref 14–54)
AST: 35 U/L (ref 15–41)
Albumin: 3.8 g/dL (ref 3.5–5.0)
Alkaline Phosphatase: 61 U/L (ref 38–126)
Anion gap: 16 — ABNORMAL HIGH (ref 5–15)
BILIRUBIN TOTAL: 0.8 mg/dL (ref 0.3–1.2)
BUN: 23 mg/dL — AB (ref 6–20)
CO2: 20 mmol/L — ABNORMAL LOW (ref 22–32)
CREATININE: 0.95 mg/dL (ref 0.44–1.00)
Calcium: 8.9 mg/dL (ref 8.9–10.3)
Chloride: 103 mmol/L (ref 101–111)
Glucose, Bld: 118 mg/dL — ABNORMAL HIGH (ref 65–99)
POTASSIUM: 4.3 mmol/L (ref 3.5–5.1)
Sodium: 139 mmol/L (ref 135–145)
TOTAL PROTEIN: 8.2 g/dL — AB (ref 6.5–8.1)

## 2017-04-27 LAB — CREATININE, SERUM
CREATININE: 0.88 mg/dL (ref 0.44–1.00)
GFR calc Af Amer: >60 mL/min (ref >60)
GFR calc non Af Amer: >60 mL/min (ref >60)

## 2017-04-27 LAB — LACTIC ACID, PLASMA
LACTIC ACID, VENOUS: 3 mmol/L — AB (ref 0.5–1.9)
LACTIC ACID, VENOUS: 3 mmol/L — AB (ref 0.5–1.9)
LACTIC ACID, VENOUS: 3.3 mmol/L — AB (ref 0.5–1.9)
LACTIC ACID, VENOUS: 6.1 mmol/L — AB (ref 0.5–1.9)

## 2017-04-27 LAB — SEDIMENTATION RATE: SED RATE: 11 mm/h (ref 0–22)

## 2017-04-27 LAB — GLUCOSE, CAPILLARY: GLUCOSE-CAPILLARY: 125 mg/dL — AB (ref 65–99)

## 2017-04-27 MED ORDER — OXYCODONE HCL 5 MG PO TABS: 5.0000 mg | ORAL_TABLET | Freq: Four times a day (QID) | ORAL | Status: DC | PRN

## 2017-04-27 MED ORDER — FERROUS SULFATE 325 (65 FE) MG PO TABS
325.0000 mg | ORAL_TABLET | Freq: Every day | ORAL | Status: DC
  Administered 2017-04-28 – 2017-04-29 (×3): 325 mg via ORAL
  Filled 2017-04-27 (×3): qty 1

## 2017-04-27 MED ORDER — ADULT MULTIVITAMIN W/MINERALS CH
1.0000 | ORAL_TABLET | Freq: Every day | ORAL | Status: DC
  Administered 2017-04-27 – 2017-04-30 (×4): 1 via ORAL
  Filled 2017-04-27 (×4): qty 1

## 2017-04-27 MED ORDER — FOLIC ACID 1 MG PO TABS
1.0000 mg | ORAL_TABLET | Freq: Every day | ORAL | Status: DC
  Administered 2017-04-27 – 2017-04-29 (×3): 1 mg via ORAL
  Filled 2017-04-27 (×3): qty 1

## 2017-04-27 MED ORDER — INSULIN ASPART 100 UNIT/ML ~~LOC~~ SOLN
0.0000 [IU] | Freq: Three times a day (TID) | SUBCUTANEOUS | Status: DC
  Administered 2017-04-29: 1 [IU] via SUBCUTANEOUS
  Administered 2017-04-29: 2 [IU] via SUBCUTANEOUS
  Administered 2017-04-30: 1 [IU] via SUBCUTANEOUS

## 2017-04-27 MED ORDER — BACLOFEN 20 MG PO TABS
20.0000 mg | ORAL_TABLET | Freq: Four times a day (QID) | ORAL | Status: DC
  Administered 2017-04-27 – 2017-04-30 (×11): 20 mg via ORAL
  Filled 2017-04-27 (×16): qty 1

## 2017-04-27 MED ORDER — DULOXETINE HCL 60 MG PO CPEP
60.0000 mg | ORAL_CAPSULE | Freq: Two times a day (BID) | ORAL | Status: DC
  Administered 2017-04-27 – 2017-04-30 (×6): 60 mg via ORAL
  Filled 2017-04-27 (×8): qty 1

## 2017-04-27 MED ORDER — NIFEDIPINE ER OSMOTIC RELEASE 30 MG PO TB24
30.0000 mg | ORAL_TABLET | Freq: Every day | ORAL | Status: DC
  Administered 2017-04-28 – 2017-04-29 (×2): 30 mg via ORAL
  Filled 2017-04-27 (×3): qty 1

## 2017-04-27 MED ORDER — SODIUM CHLORIDE 0.9 % IV BOLUS (SEPSIS)
500.0000 mL | Freq: Once | INTRAVENOUS | Status: AC
  Administered 2017-04-28: 500 mL via INTRAVENOUS

## 2017-04-27 MED ORDER — OXYCODONE-ACETAMINOPHEN 10-325 MG PO TABS: 1.0000 | ORAL_TABLET | Freq: Four times a day (QID) | ORAL | Status: DC | PRN

## 2017-04-27 MED ORDER — ACETAMINOPHEN 325 MG PO TABS
325.0000 mg | ORAL_TABLET | Freq: Once | ORAL | Status: AC
  Administered 2017-04-27: 325 mg via ORAL
  Filled 2017-04-27: qty 1

## 2017-04-27 MED ORDER — DOCUSATE SODIUM 100 MG PO CAPS: 100.0000 mg | ORAL_CAPSULE | Freq: Every day | ORAL | Status: DC | PRN

## 2017-04-27 MED ORDER — LEVOTHYROXINE SODIUM 25 MCG PO TABS
125.0000 ug | ORAL_TABLET | Freq: Every day | ORAL | Status: DC
  Administered 2017-04-28 – 2017-04-29 (×2): 125 ug via ORAL
  Filled 2017-04-27 (×3): qty 1

## 2017-04-27 MED ORDER — ZINC SULFATE 220 (50 ZN) MG PO CAPS
220.0000 mg | ORAL_CAPSULE | Freq: Every day | ORAL | Status: DC
  Administered 2017-04-28 – 2017-04-30 (×3): 220 mg via ORAL
  Filled 2017-04-27 (×4): qty 1

## 2017-04-27 MED ORDER — MORPHINE SULFATE ER 15 MG PO TBCR
30.0000 mg | EXTENDED_RELEASE_TABLET | Freq: Two times a day (BID) | ORAL | Status: DC
  Administered 2017-04-27 – 2017-04-30 (×6): 30 mg via ORAL
  Filled 2017-04-27 (×6): qty 2

## 2017-04-27 MED ORDER — MORPHINE SULFATE ER 30 MG PO TBCR: 30.0000 mg | EXTENDED_RELEASE_TABLET | Freq: Three times a day (TID) | ORAL | Status: DC

## 2017-04-27 MED ORDER — OXYCODONE-ACETAMINOPHEN 5-325 MG PO TABS
1.0000 | ORAL_TABLET | Freq: Four times a day (QID) | ORAL | Status: DC | PRN
  Administered 2017-04-27 – 2017-04-29 (×2): 2 via ORAL
  Filled 2017-04-27: qty 2
  Filled 2017-04-27 (×2): qty 1

## 2017-04-27 MED ORDER — GABAPENTIN 400 MG PO CAPS
1600.0000 mg | ORAL_CAPSULE | Freq: Two times a day (BID) | ORAL | Status: DC
  Administered 2017-04-27 – 2017-04-29 (×6): 1600 mg via ORAL
  Filled 2017-04-27 (×7): qty 4

## 2017-04-27 MED ORDER — METOPROLOL TARTRATE 100 MG PO TABS
100.0000 mg | ORAL_TABLET | Freq: Two times a day (BID) | ORAL | Status: DC
  Administered 2017-04-27 – 2017-04-30 (×7): 100 mg via ORAL
  Filled 2017-04-27 (×6): qty 1
  Filled 2017-04-27: qty 4

## 2017-04-27 NOTE — ED Notes (Signed)
Patient transported to CT 

## 2017-04-27 NOTE — ED Notes (Signed)
Admitting paged for order for consult for wound team d/t sacral pressure ulcer

## 2017-04-27 NOTE — ED Notes (Signed)
Heart healthy lunch tray given to pt

## 2017-04-27 NOTE — ED Notes (Signed)
Checked CBG 169, RN Sharrie Rothman informed

## 2017-04-27 NOTE — ED Notes (Signed)
Admitting MD paged with critical lactic results

## 2017-04-27 NOTE — ED Notes (Signed)
Posey Pronto, MD returned call, wound consult placed, pt to be removed from cooling blanket

## 2017-04-27 NOTE — ED Notes (Signed)
Air mattress ordered per [employer] request

## 2017-04-27 NOTE — Consult Note (Addendum)
[facility] Nurse wound consult note Reason for Consult: Consult requested for left ischium.   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 Lower left ischium with chronic stage 3 pressure injury; 1.5X2X.2cm, red moist wound bed, , no odor, mod amt tan drainage, Pressure Injury POA: Yes Dressing procedure/placement/frequency: Pt has 2 chronic wounds which she states are followed by the outpatient wound care center.  They have been using Silver Hydrofiber and collagen dressings, according to the patient.  Informed her that collagen dressings are not available in the [facility] and her husband can bring them in for use if desired.  Plan to continue Aquacel dressings at this time. Discussed plan of care and patient verbalized understanding. Air mattress to reduce pressure. Please re-consult if further assistance is needed.  Thank-you,  Julien Girt MSN, [place], [place], [place], [place]

## 2017-04-27 NOTE — Progress Notes (Signed)
[facility] Progress Note  Patient: [name] TWS:[id number]   PCP: [name], MD DOB: Feb 23, 1951   DOA: 04/26/2017   DOS: 04/27/2017   Date of Service: the patient was seen and examined on 04/27/2017  Subjective: feeling better, no nausea no vomiting, no abdominal pain, no fever or chills.  Brief hospital course: Pt. with PMH of paraplegia with suprapubic catheter and sacral decubitus ulcer, diabetes mellitus type 2, hypertension, hypothyroidism and chronic anemia and previous history of breast cancer in remission; admitted on 04/26/2017, presented with complaint of confusion, was found to have acute encephalopathy from sepsis due to UTI. Currently further plan is continue current care.  Assessment and Plan: 1.  Sepsis due to UTI. Catheter associated from chronic suprapubic catheter. Numerous to count WBC on urine. Presented with fever leukocytosis tachycardia meeting sepsis criteria. Lactic acid was significantly elevated up to 6. Improving with IV hydration. Continue with IV vancomycin and Zosyn. CT abdomen is negative for any significant abnormality. Continue to monitor in the stepdown unit for now.  2.  Acute metabolic encephalopathy. In the setting of sepsis as well as pyrexia. Currently better and back to baseline. Monitor and avoid psychotropic medications.  3.  Chronic osteomyelitis. Patient has sacral decubitus ulcer stage IV. Gets outpatient wound care. Wound care consult appreciated.  An mattress provided. CT abdomen makes a comment regarding presence of chronic osteomyelitis although ESR not elevated at all and CRP significantly better than prior do not suspect the patient has an ongoing active infection in the sacral wound.  4.  Type 2 diabetes mellitus. Holding home oral hypoglycemic agent. Continue sliding scale insulin.  5.  Accelerated hypertension. Blood pressure significantly elevated question hypertensive encephalopathy on presentation as  well. Significantly better now on home regimen. Continue to monitor.  6.  Hypothyroidism. Continue Synthroid.  7.  History of breast cancer. In remission. Monitor.  8.  Chronic anemia. H&H relatively stable. Most likely anemia of chronic disease. Monitor.  Diet: carb modified DVT Prophylaxis: subcutaneous Heparin  Advance goals of care discussion: full code  Family Communication: family was present at bedside, at the time of interview. The pt provided permission to discuss medical plan with the family. Opportunity was given to ask question and all questions were answered satisfactorily.   Disposition:  Discharge to home.  Consultants: none Procedures: none  Antibiotics: Anti-infectives (From admission, onward)   Start     Dose/Rate Route Frequency Ordered Stop   04/27/17 1000  vancomycin (VANCOCIN) IVPB 750 mg/150 ml premix     750 mg 150 mL/hr over 60 Minutes Intravenous Every 12 hours 04/26/17 2316     04/27/17 0600  piperacillin-tazobactam (ZOSYN) IVPB 3.375 g     3.375 g 12.5 mL/hr over 240 Minutes Intravenous Every 8 hours 04/26/17 2316     04/26/17 2215  piperacillin-tazobactam (ZOSYN) IVPB 3.375 g     3.375 g 100 mL/hr over 30 Minutes Intravenous  Once 04/26/17 2203 04/26/17 2302   04/26/17 2215  vancomycin (VANCOCIN) IVPB 1000 mg/200 mL premix     1,000 mg 200 mL/hr over 60 Minutes Intravenous  Once 04/26/17 2203 04/26/17 2333       Objective: Physical Exam: Vitals:   04/27/17 1030 04/27/17 1045 04/27/17 1100 04/27/17 1200  BP: (!) 160/79 (!) 167/78 (!) 141/72 (!) 157/72  Pulse: (!) 103 (!) 101 (!) 107 (!) 105  Resp: '12 17 17 13  '$ Temp:      TempSrc:      SpO2: 97% 98% 98% 98%  Weight:      Height:        Intake/Output Summary (Last 24 hours) at 04/27/2017 1354 Last data filed at 04/27/2017 1207 Gross per 24 hour  Intake 4000 ml  Output 1000 ml  Net 3000 ml   Filed Weights   04/26/17 2200  Weight: 79.4 kg (175 lb 0.7 oz)   General: Alert,  Awake and Oriented to Time, Place and Person. Appear in moderate distress, affect appropriate Eyes: PERRL, Conjunctiva normal ENT: Oral Mucosa clear moist. Neck: no JVD, no Abnormal Mass Or lumps Cardiovascular: S1 and S2 Present, no Murmur, Peripheral Pulses Present Respiratory: normal respiratory effort, Bilateral Air entry equal and Decreased, no use of accessory muscle, Clear to Auscultation, no Crackles, no wheezes Abdomen: Bowel Sound present, Soft and no tenderness, no hernia Skin: no redness, no Rash, no induration Extremities: no Pedal edema,  Neurologic: chronic paraplegia Data Reviewed: CBC: Recent Labs  Lab 04/26/17 2207 04/27/17 0059 04/27/17 0421  WBC 16.5* 16.3* 17.2*  NEUTROABS 15.0*  --  14.4*  HGB 15.4* 14.4 17.5*  HCT 46.6* 44.3 54.9*  MCV 94.5 94.9 95.6  PLT 253 175 831   Basic Metabolic Panel: Recent Labs  Lab 04/26/17 2207 04/27/17 0059 04/27/17 0421  NA 136  --  139  K 4.2  --  4.3  CL 98*  --  103  CO2 24  --  20*  GLUCOSE 160*  --  118*  BUN 26*  --  23*  CREATININE 1.01* 0.88 0.95  CALCIUM 9.5  --  8.9    Liver Function Tests: Recent Labs  Lab 04/26/17 2207 04/27/17 0421  AST 39 35  ALT 27 25  ALKPHOS 68 61  BILITOT 0.7 0.8  PROT 9.1* 8.2*  ALBUMIN 4.0 3.8   No results for input(s): LIPASE, AMYLASE in the last 168 hours. No results for input(s): AMMONIA in the last 168 hours. Coagulation Profile: Recent Labs  Lab 04/26/17 2207  INR 1.04   Cardiac Enzymes: No results for input(s): CKTOTAL, CKMB, CKMBINDEX, TROPONINI in the last 168 hours. BNP (last 3 results) No results for input(s): PROBNP in the last 8760 hours. CBG: Recent Labs  Lab 04/26/17 2138 04/27/17 0029 04/27/17 0739 04/27/17 1244  GLUCAP 179* 169* 115* 125*   Studies: Ct Abdomen Pelvis Wo Contrast  Result Date: 04/27/2017 CLINICAL DATA:  Abdominal distention. History of breast cancer, hypertension, paraplegia, ischial sacral decubitus ulcer, and suprapubic  catheter. Dark urine. IV contrast material allergy. EXAM: CT ABDOMEN AND PELVIS WITHOUT CONTRAST TECHNIQUE: Multidetector CT imaging of the abdomen and pelvis was performed following the standard protocol without IV contrast. COMPARISON:  10/28/2015 FINDINGS: Examination is limited due to motion artifact. Lower chest: Atelectasis in the lung bases. Small esophageal hiatal hernia. Hepatobiliary: Mild diffuse fatty infiltration of the liver. Gallbladder is distended. No stones or wall thickening identified. No bile duct dilatation. Pancreas: No acute process identified on noncontrast imaging. Spleen: Calcified granulomas in the spleen. Adrenals/Urinary Tract: No adrenal gland nodules. 10 mm diameter stone in the lower pole right kidney. No hydronephrosis or hydroureter. Suprapubic catheter deflate the bladder. Bladder wall thickening could be due to under distention or may indicate cystitis. Stomach/Bowel: Stomach and small bowel are decompressed. Diffusely stool-filled colon. No inflammatory stranding. Appendix is normal. Vascular/Lymphatic: Calcification of the aorta. No aneurysm. No significant lymphadenopathy. Reproductive: Status post hysterectomy. No adnexal masses. Other: Prominent lymph nodes in the groin regions bilaterally, likely reactive. Left ischial decubitus ulcer is incompletely included within the field  of view. Small periumbilical hernia containing fat. No free air or free fluid in the abdomen. Musculoskeletal: Bone sclerosis with cortical loss underlying the ischial decubitus ulcer on the left. This is consistent with chronic osteomyelitis. Degenerative changes in the lumbar spine and hips. Severe bone encroachment upon the central canal posteriorly at T10 level consistent with facet joint hypertrophy. IMPRESSION: 1. Mild diffuse fatty infiltration of the liver. Nonspecific gallbladder distention. No stones or inflammatory changes identified. 2. 10 mm nonobstructing stone in the lower pole right  kidney. 3. Suprapubic catheter deflects the bladder. Bladder wall thickening could be due to under distention or cystitis. 4. Aortic atherosclerosis. 5. Left ischial decubitus ulcer extending to the ischium with evidence of chronic osteomyelitis. 6. Prominent degenerative changes in the lumbar spine. Electronically Signed   By: Lucienne Capers M.D.   On: 04/27/2017 01:45   Dg Chest Port 1 View  Result Date: 04/26/2017 CLINICAL DATA:  Abnormal urine. EXAM: PORTABLE CHEST 1 VIEW COMPARISON:  October 30, 2015 FINDINGS: The heart size and mediastinal contours are within normal limits. Both lungs are clear. The visualized skeletal structures are unremarkable. IMPRESSION: No active disease. Electronically Signed   By: Dorise Bullion III M.D   On: 04/26/2017 22:25    Scheduled Meds: . baclofen  20 mg Oral QID  . DULoxetine  60 mg Oral BID  . enoxaparin (LOVENOX) injection  40 mg Subcutaneous Daily  . ferrous sulfate  325 mg Oral QPC supper  . folic acid  1 mg Oral QPC supper  . gabapentin  1,600 mg Oral BID  . insulin aspart  0-9 Units Subcutaneous TID WC  . levothyroxine  125 mcg Oral QPC supper  . metoprolol tartrate  100 mg Oral BID  . morphine  30 mg Oral Q12H  . multivitamin with minerals  1 tablet Oral Daily  . NIFEdipine  30 mg Oral QPC supper  . zinc sulfate  220 mg Oral Daily   Continuous Infusions: . sodium chloride 125 mL/hr at 04/27/17 1227  . piperacillin-tazobactam (ZOSYN)  IV Stopped (04/27/17 1143)  . vancomycin 750 mg (04/27/17 1322)   PRN Meds: acetaminophen **OR** acetaminophen, docusate sodium, hydrALAZINE, ondansetron **OR** ondansetron (ZOFRAN) IV, oxyCODONE-acetaminophen **AND** oxyCODONE  Time spent: 35 minutes  Author: Berle Mull, MD [facility] Pager: [phone] 04/27/2017 1:54 PM  If 7PM-7AM, please contact night-coverage at [email or web address], password [facility]

## 2017-04-28 ENCOUNTER — Other Ambulatory Visit: Payer: Self-pay

## 2017-04-28 LAB — COMPREHENSIVE METABOLIC PANEL
ALT: 36 U/L (ref 14–54)
ANION GAP: 11 (ref 5–15)
AST: 52 U/L — ABNORMAL HIGH (ref 15–41)
Albumin: 3 g/dL — ABNORMAL LOW (ref 3.5–5.0)
Alkaline Phosphatase: 48 U/L (ref 38–126)
BUN: 12 mg/dL (ref 6–20)
CALCIUM: 8.4 mg/dL — AB (ref 8.9–10.3)
CHLORIDE: 108 mmol/L (ref 101–111)
CO2: 20 mmol/L — AB (ref 22–32)
Creatinine, Ser: 0.86 mg/dL (ref 0.44–1.00)
Glucose, Bld: 138 mg/dL — ABNORMAL HIGH (ref 65–99)
Potassium: 3 mmol/L — ABNORMAL LOW (ref 3.5–5.1)
SODIUM: 139 mmol/L (ref 135–145)
Total Bilirubin: 0.8 mg/dL (ref 0.3–1.2)
Total Protein: 6.9 g/dL (ref 6.5–8.1)

## 2017-04-28 LAB — CBC WITH DIFFERENTIAL/PLATELET
Basophils Absolute: 0 10*3/uL (ref 0.0–0.1)
Basophils Relative: 0 %
EOS ABS: 0 10*3/uL (ref 0.0–0.7)
Eosinophils Relative: 0 %
HCT: 38.3 % (ref 36.0–46.0)
Hemoglobin: 12.4 g/dL (ref 12.0–15.0)
LYMPHS ABS: 1.1 10*3/uL (ref 0.7–4.0)
LYMPHS PCT: 12 %
MCH: 30.5 pg (ref 26.0–34.0)
MCHC: 32.4 g/dL (ref 30.0–36.0)
MCV: 94.3 fL (ref 78.0–100.0)
MONOS PCT: 7 %
Monocytes Absolute: 0.7 10*3/uL (ref 0.1–1.0)
Neutro Abs: 7.9 10*3/uL — ABNORMAL HIGH (ref 1.7–7.7)
Neutrophils Relative %: 81 %
PLATELETS: 197 10*3/uL (ref 150–400)
RBC: 4.06 MIL/uL (ref 3.87–5.11)
RDW: 14.7 % (ref 11.5–15.5)
WBC: 9.6 10*3/uL (ref 4.0–10.5)

## 2017-04-28 LAB — URINE CULTURE

## 2017-04-28 LAB — GLUCOSE, CAPILLARY
GLUCOSE-CAPILLARY: 113 mg/dL — AB (ref 65–99)
GLUCOSE-CAPILLARY: 195 mg/dL — AB (ref 65–99)
Glucose-Capillary: 119 mg/dL — ABNORMAL HIGH (ref 65–99)
Glucose-Capillary: 145 mg/dL — ABNORMAL HIGH (ref 65–99)

## 2017-04-28 LAB — MAGNESIUM: MAGNESIUM: 1.9 mg/dL (ref 1.7–2.4)

## 2017-04-28 MED ORDER — SENNA 8.6 MG PO TABS
2.0000 | ORAL_TABLET | Freq: Every day | ORAL | Status: DC | PRN
  Administered 2017-04-28: 17.2 mg via ORAL
  Filled 2017-04-28: qty 2

## 2017-04-28 MED ORDER — DOCUSATE SODIUM 100 MG PO CAPS
100.0000 mg | ORAL_CAPSULE | Freq: Two times a day (BID) | ORAL | Status: DC | PRN
  Administered 2017-04-28: 100 mg via ORAL
  Filled 2017-04-28: qty 1

## 2017-04-28 MED ORDER — SENNOSIDES-DOCUSATE SODIUM 8.6-50 MG PO TABS: 1.0000 | ORAL_TABLET | Freq: Two times a day (BID) | ORAL | Status: DC

## 2017-04-28 MED ORDER — DEXTROSE 5 % IV SOLN
1.0000 g | Freq: Every day | INTRAVENOUS | Status: DC
  Administered 2017-04-28 – 2017-04-30 (×3): 1 g via INTRAVENOUS
  Filled 2017-04-28 (×3): qty 10

## 2017-04-28 MED ORDER — POTASSIUM CHLORIDE CRYS ER 20 MEQ PO TBCR
40.0000 meq | EXTENDED_RELEASE_TABLET | ORAL | Status: AC
  Administered 2017-04-28 (×2): 40 meq via ORAL
  Filled 2017-04-28 (×2): qty 2

## 2017-04-28 MED ORDER — POLYETHYLENE GLYCOL 3350 17 G PO PACK: 17.0000 g | PACK | Freq: Every day | ORAL | Status: DC

## 2017-04-28 NOTE — Plan of Care (Signed)
  Progressing Health Behavior/Discharge Planning: Ability to manage health-related needs will improve 04/28/2017 0222 - Progressing by Peggye Pitt, RN Clinical Measurements: Respiratory complications will improve 04/28/2017 0222 - Progressing by Peggye Pitt, RN Cardiovascular complication will be avoided 04/28/2017 0222 - Progressing by Peggye Pitt, RN   Completed/Met Education: Knowledge of General Education information will improve 04/28/2017 0222 - Completed/Met by Peggye Pitt, RN Respiratory: Ability to maintain adequate ventilation will improve 04/28/2017 0222 - Completed/Met by Peggye Pitt, RN

## 2017-04-28 NOTE — Progress Notes (Signed)
[facility] Progress Note  Patient: [name] YVO:[id number]   PCP: [name], MD DOB: 07/16/1950   DOA: 04/26/2017   DOS: 04/28/2017   Date of Service: the patient was seen and examined on 04/28/2017  Subjective: Feeling better, had fever last night again.  No chest pain no abdominal pain.  Had some nausea earlier in the morning currently resolved.  Concerned that the she is not getting any Lasix.  Brief hospital course: Pt. with PMH of paraplegia with suprapubic catheter and sacral decubitus ulcer, diabetes mellitus type 2, hypertension, hypothyroidism and chronic anemia and previous history of breast cancer in remission; admitted on 04/26/2017, presented with complaint of confusion, was found to have acute encephalopathy from sepsis due to UTI.  Suprapubic catheter was changed on 04/27/2017. Currently further plan is continue current care.  Assessment and Plan: 1.  Sepsis due to UTI. Catheter associated from chronic suprapubic catheter. Numerous to count WBC on urine. Presented with fever leukocytosis tachycardia meeting sepsis criteria. Lactic acid was significantly elevated up to 6. Improving with IV hydration.  Patient was explained the rational to hold diuretic. Started on IV vancomycin and Zosyn.  We will switch to IV ceftriaxone.  Urine culture is growing multiple species.  Prior culture has grown pansensitive E. Coli. Recently treated with Cipro on Christmas eve for 7 days for UTI by primary urologist.  Outpatient catheter change was performed on 04/14/2017. CT abdomen is negative for any significant abnormality. Continue to monitor in the stepdown unit for now.  2.  Acute metabolic encephalopathy. In the setting of sepsis as well as pyrexia. Currently better and back to baseline. Monitor and avoid psychotropic medications.  3.  Chronic osteomyelitis. Patient has sacral decubitus ulcer stage IV.  Present on admission Gets outpatient wound care. Wound care consult  appreciated.  Air mattress provided. CT abdomen makes a comment regarding presence of chronic osteomyelitis although ESR not elevated at all and CRP significantly better than prior do not suspect the patient has an ongoing active infection in the sacral wound.  4.  Type 2 diabetes mellitus. Holding home oral hypoglycemic agent. Continue sliding scale insulin.  5.  Accelerated hypertension. Blood pressure significantly elevated question hypertensive encephalopathy on presentation as well. Significantly better now on home regimen. Continue to monitor.  6.  Hypothyroidism. Continue Synthroid.  7.  History of breast cancer. In remission. Monitor.  8.  Chronic anemia. H&H relatively stable. Most likely anemia of chronic disease. Monitor.  9.  Hypokalemia. Replacing and will recheck tomorrow.  10.  Chronic paraplegia PT OT consult.  Diet: carb modified DVT Prophylaxis: subcutaneous Heparin  Advance goals of care discussion: full code  Family Communication: family was present at bedside, at the time of interview. The pt provided permission to discuss medical plan with the family. Opportunity was given to ask question and all questions were answered satisfactorily.   Disposition:  Discharge to home.  Consultants: none Procedures: none  Antibiotics: Anti-infectives (From admission, onward)   Start     Dose/Rate Route Frequency Ordered Stop   04/28/17 1200  cefTRIAXone (ROCEPHIN) 1 g in dextrose 5 % 50 mL IVPB     1 g 100 mL/hr over 30 Minutes Intravenous Daily 04/28/17 1002     04/27/17 1000  vancomycin (VANCOCIN) IVPB 750 mg/150 ml premix  Status:  Discontinued     750 mg 150 mL/hr over 60 Minutes Intravenous Every 12 hours 04/26/17 2316 04/28/17 0938   04/27/17 0600  piperacillin-tazobactam (ZOSYN) IVPB 3.375 g  Status:  Discontinued     3.375 g 12.5 mL/hr over 240 Minutes Intravenous Every 8 hours 04/26/17 2316 04/28/17 0940   04/26/17 2215  piperacillin-tazobactam  (ZOSYN) IVPB 3.375 g     3.375 g 100 mL/hr over 30 Minutes Intravenous  Once 04/26/17 2203 04/26/17 2302   04/26/17 2215  vancomycin (VANCOCIN) IVPB 1000 mg/200 mL premix     1,000 mg 200 mL/hr over 60 Minutes Intravenous  Once 04/26/17 2203 04/26/17 2333       Objective: Physical Exam: Vitals:   04/27/17 2358 04/28/17 0441 04/28/17 0820 04/28/17 1100  BP: (!) 145/68 (!) 148/68 (!) 143/69 (!) 145/70  Pulse: (!) 117 (!) 107 (!) 108 (!) 115  Resp: 16 16  (!) 21  Temp: (!) 100.4 F (38 C) 99.4 F (37.4 C) 99.3 F (37.4 C) 98.3 F (36.8 C)  TempSrc: Oral Oral Oral Oral  SpO2: 96% 98% 99% 100%  Weight:  100.8 kg (222 lb 4.8 oz)    Height:        Intake/Output Summary (Last 24 hours) at 04/28/2017 1131 Last data filed at 04/28/2017 1100 Gross per 24 hour  Intake 1550 ml  Output 3850 ml  Net -2300 ml   Filed Weights   04/26/17 2200 04/27/17 2107 04/28/17 0441  Weight: 79.4 kg (175 lb 0.7 oz) 97.7 kg (215 lb 4.8 oz) 100.8 kg (222 lb 4.8 oz)   General: Alert, Awake and Oriented to Time, Place and Person. Appear in moderate distress, affect appropriate Eyes: PERRL, Conjunctiva normal ENT: Oral Mucosa clear moist. Neck: no JVD, no Abnormal Mass Or lumps Cardiovascular: S1 and S2 Present, no Murmur, Peripheral Pulses Present Respiratory: normal respiratory effort, Bilateral Air entry equal and Decreased, no use of accessory muscle, Clear to Auscultation, no Crackles, no wheezes Abdomen: Bowel Sound present, Soft and no tenderness, no hernia Skin: no redness, no Rash, no induration Extremities: no Pedal edema,  Neurologic: chronic paraplegia Data Reviewed: CBC: Recent Labs  Lab 04/26/17 2207 04/27/17 0059 04/27/17 0421 04/28/17 0244  WBC 16.5* 16.3* 17.2* 9.6  NEUTROABS 15.0*  --  14.4* 7.9*  HGB 15.4* 14.4 17.5* 12.4  HCT 46.6* 44.3 54.9* 38.3  MCV 94.5 94.9 95.6 94.3  PLT 253 175 157 027   Basic Metabolic Panel: Recent Labs  Lab 04/26/17 2207 04/27/17 0059  04/27/17 0421 04/28/17 0244  NA 136  --  139 139  K 4.2  --  4.3 3.0*  CL 98*  --  103 108  CO2 24  --  20* 20*  GLUCOSE 160*  --  118* 138*  BUN 26*  --  23* 12  CREATININE 1.01* 0.88 0.95 0.86  CALCIUM 9.5  --  8.9 8.4*  MG  --   --   --  1.9    Liver Function Tests: Recent Labs  Lab 04/26/17 2207 04/27/17 0421 04/28/17 0244  AST 39 35 52*  ALT 27 25 36  ALKPHOS 68 61 48  BILITOT 0.7 0.8 0.8  PROT 9.1* 8.2* 6.9  ALBUMIN 4.0 3.8 3.0*   No results for input(s): LIPASE, AMYLASE in the last 168 hours. No results for input(s): AMMONIA in the last 168 hours. Coagulation Profile: Recent Labs  Lab 04/26/17 2207  INR 1.04   Cardiac Enzymes: No results for input(s): CKTOTAL, CKMB, CKMBINDEX, TROPONINI in the last 168 hours. BNP (last 3 results) No results for input(s): PROBNP in the last 8760 hours. CBG: Recent Labs  Lab 04/27/17 1244 04/27/17 2016 04/27/17 2145  04/28/17 0550 04/28/17 1105  GLUCAP 125* 102* 125* 113* 195*   Studies: No results found.  Scheduled Meds: . baclofen  20 mg Oral QID  . DULoxetine  60 mg Oral BID  . enoxaparin (LOVENOX) injection  40 mg Subcutaneous Daily  . ferrous sulfate  325 mg Oral QPC supper  . folic acid  1 mg Oral QPC supper  . gabapentin  1,600 mg Oral BID  . insulin aspart  0-9 Units Subcutaneous TID WC  . levothyroxine  125 mcg Oral QPC supper  . metoprolol tartrate  100 mg Oral BID  . morphine  30 mg Oral Q12H  . multivitamin with minerals  1 tablet Oral Daily  . NIFEdipine  30 mg Oral QPC supper  . polyethylene glycol  17 g Oral Daily  . potassium chloride  40 mEq Oral Q2H  . senna-docusate  1 tablet Oral BID  . zinc sulfate  220 mg Oral Daily   Continuous Infusions: . cefTRIAXone (ROCEPHIN)  IV     PRN Meds: acetaminophen **OR** acetaminophen, hydrALAZINE, ondansetron **OR** ondansetron (ZOFRAN) IV, oxyCODONE-acetaminophen **AND** oxyCODONE  Time spent: 35 minutes  Author: Berle Mull, MD [facility] Pager: [phone] 04/28/2017 11:31 AM  If 7PM-7AM, please contact night-coverage at [email or web address], password [email or web address]

## 2017-04-29 DIAGNOSIS — A419 Sepsis, unspecified organism: Secondary | ICD-10-CM

## 2017-04-29 DIAGNOSIS — T83510A Infection and inflammatory reaction due to cystostomy catheter, initial encounter: Secondary | ICD-10-CM

## 2017-04-29 DIAGNOSIS — G8220 Paraplegia, unspecified: Secondary | ICD-10-CM

## 2017-04-29 DIAGNOSIS — N390 Urinary tract infection, site not specified: Secondary | ICD-10-CM

## 2017-04-29 DIAGNOSIS — I10 Essential (primary) hypertension: Secondary | ICD-10-CM

## 2017-04-29 DIAGNOSIS — L89154 Pressure ulcer of sacral region, stage 4: Secondary | ICD-10-CM

## 2017-04-29 LAB — CBC
HEMATOCRIT: 38.6 % (ref 36.0–46.0)
HEMOGLOBIN: 12.4 g/dL (ref 12.0–15.0)
MCH: 30.4 pg (ref 26.0–34.0)
MCHC: 32.1 g/dL (ref 30.0–36.0)
MCV: 94.6 fL (ref 78.0–100.0)
Platelets: 192 10*3/uL (ref 150–400)
RBC: 4.08 MIL/uL (ref 3.87–5.11)
RDW: 14.7 % (ref 11.5–15.5)
WBC: 8.8 10*3/uL (ref 4.0–10.5)

## 2017-04-29 LAB — BASIC METABOLIC PANEL
ANION GAP: 10 (ref 5–15)
BUN: 9 mg/dL (ref 6–20)
CO2: 22 mmol/L (ref 22–32)
Calcium: 8.8 mg/dL — ABNORMAL LOW (ref 8.9–10.3)
Chloride: 105 mmol/L (ref 101–111)
Creatinine, Ser: 0.8 mg/dL (ref 0.44–1.00)
GFR calc Af Amer: >60 mL/min (ref >60)
Glucose, Bld: 150 mg/dL — ABNORMAL HIGH (ref 65–99)
POTASSIUM: 4.1 mmol/L (ref 3.5–5.1)
Sodium: 137 mmol/L (ref 135–145)

## 2017-04-29 LAB — GLUCOSE, CAPILLARY
GLUCOSE-CAPILLARY: 177 mg/dL — AB (ref 65–99)
GLUCOSE-CAPILLARY: 137 mg/dL — AB (ref 65–99)
Glucose-Capillary: 137 mg/dL — ABNORMAL HIGH (ref 65–99)
Glucose-Capillary: 106 mg/dL — ABNORMAL HIGH (ref 65–99)

## 2017-04-29 LAB — MAGNESIUM: Magnesium: 2.1 mg/dL (ref 1.7–2.4)

## 2017-04-29 LAB — MRSA PCR SCREENING: MRSA by PCR: INVALID — AB

## 2017-04-29 NOTE — Progress Notes (Signed)
PROGRESS NOTE [facility]   [name]   IRS:[id number] DOB: [birth year]  DOA: 04/26/2017 PCP: [name], MD   Brief Narrative:  [name] is a 67 y/o F with PMH of paraplegia with suprapubic catheter and sacral decubitus ulcer, diabetes mellitus type 2, hypertension, hypothyroidism and chronic anemia and previous history of breast cancer in remission; admitted on 04/26/2017, presented with complaint of confusion, was found to have acute encephalopathy from sepsis due to UTI.  Suprapubic catheter was changed on 04/27/2017.  Subjective: Patient seen and examined, she is doing much better, although febrile episode overnight. No other complaints.   Assessment & Plan: 1.  Sepsis due to UTI. -Sepsis physiology has resolved Catheter associated from chronic suprapubic catheter. Initially started on on IV vancomycin and Zosyn subsequently switched it to ceftriaxone Urine culture is growing multiple species.  Prior culture has grown pansensitive E. Coli. Recently treated with Cipro on Christmas eve for 7 days for UTI by primary urologist.  Outpatient catheter change was performed on 04/14/2017. CT abdomen is negative for any significant abnormality.  2.  Acute metabolic encephalopathy. In setting of UTI and elevated blood pressure Mentally back to baseline Treat underlying causes  3.  Chronic osteomyelitis/Sacral decubitus ulcer stage IV.   [facility] outpatient wound care. Wound care consult appreciated.  Air mattress provided. On CT abdomen with evidence of chronic osteomyelitis, inflammatory markers unremarkable, no acute infection at this point.  4.  Type 2 diabetes mellitus. Holding oral hypoglycemic agent, CBGs stable Continue SSI  5.  Accelerated hypertension. On admission blood pressure was significantly elevated, she was placed back on her home antihypertensive medication with good improvement of her blood pressure. Continue current regimen Monitor BP  6.   Hypothyroidism. Stable, continue Synthroid  7.  History of breast cancer. In remission  8.  Hypokalemia. Resolved   DVT prophylaxis: Heparin subq Code Status: Full code Family Communication: None at bedside Disposition Plan: Home in a.m. if remains afebrile and stable  Consultants:   None  Procedures:   None  Antimicrobials: Anti-infectives (From admission, onward)   Start     Dose/Rate Route Frequency Ordered Stop   04/28/17 1200  cefTRIAXone (ROCEPHIN) 1 g in dextrose 5 % 50 mL IVPB     1 g 100 mL/hr over 30 Minutes Intravenous Daily 04/28/17 1002     04/27/17 1000  vancomycin (VANCOCIN) IVPB 750 mg/150 ml premix  Status:  Discontinued     750 mg 150 mL/hr over 60 Minutes Intravenous Every 12 hours 04/26/17 2316 04/28/17 0938   04/27/17 0600  piperacillin-tazobactam (ZOSYN) IVPB 3.375 g  Status:  Discontinued     3.375 g 12.5 mL/hr over 240 Minutes Intravenous Every 8 hours 04/26/17 2316 04/28/17 0940   04/26/17 2215  piperacillin-tazobactam (ZOSYN) IVPB 3.375 g     3.375 g 100 mL/hr over 30 Minutes Intravenous  Once 04/26/17 2203 04/26/17 2302   04/26/17 2215  vancomycin (VANCOCIN) IVPB 1000 mg/200 mL premix     1,000 mg 200 mL/hr over 60 Minutes Intravenous  Once 04/26/17 2203 04/26/17 2333       Objective: Vitals:   04/28/17 2340 04/29/17 0431 04/29/17 0625 04/29/17 0752  BP: (!) 152/81 (!) 167/75  132/65  Pulse: (!) 111 (!) 120 (!) 118 (!) 110  Resp: 12 (!) 22 19 (!) 110  Temp: 99.4 F (37.4 C) (!) 100.9 F (38.3 C) 98.9 F (37.2 C) 98.6 F (37 C)  TempSrc: Oral Oral Oral Oral  SpO2: 98% 93%  94% 92%  Weight:      Height:        Intake/Output Summary (Last 24 hours) at 04/29/2017 1007 Last data filed at 04/29/2017 0300 Gross per 24 hour  Intake 120 ml  Output 1865 ml  Net -1745 ml   Filed Weights   04/26/17 2200 04/27/17 2107 04/28/17 0441  Weight: 79.4 kg (175 lb 0.7 oz) 97.7 kg (215 lb 4.8 oz) 100.8 kg (222 lb 4.8 oz)     Examination:  General exam: Appears calm and comfortable  HEENT: OP moist and clear Respiratory system: Decreased breath sounds bilaterally. No wheezes,crackle or rhonchi Cardiovascular system: S1 & S2 heard, RRR. No JVD, murmurs, rubs or gallops Gastrointestinal system: Abdomen is nondistended, soft and nontender. No organomegaly or masses felt. Normal bowel sounds heard. Genitourinary: Foley catheter in place, urine yellow Central nervous system: Alert and oriented x3, paraplegia Extremities: No lower extremity edema Skin: No rashes Psychiatry: Mood appropriate   Data Reviewed: I have personally reviewed following labs and imaging studies  CBC: Recent Labs  Lab 04/26/17 2207 04/27/17 0059 04/27/17 0421 04/28/17 0244 04/29/17 0420  WBC 16.5* 16.3* 17.2* 9.6 8.8  NEUTROABS 15.0*  --  14.4* 7.9*  --   HGB 15.4* 14.4 17.5* 12.4 12.4  HCT 46.6* 44.3 54.9* 38.3 38.6  MCV 94.5 94.9 95.6 94.3 94.6  PLT 253 175 157 197 161   Basic Metabolic Panel: Recent Labs  Lab 04/26/17 2207 04/27/17 0059 04/27/17 0421 04/28/17 0244 04/29/17 0420  NA 136  --  139 139 137  K 4.2  --  4.3 3.0* 4.1  CL 98*  --  103 108 105  CO2 24  --  20* 20* 22  GLUCOSE 160*  --  118* 138* 150*  BUN 26*  --  23* 12 9  CREATININE 1.01* 0.88 0.95 0.86 0.80  CALCIUM 9.5  --  8.9 8.4* 8.8*  MG  --   --   --  1.9 2.1   GFR: Estimated Creatinine Clearance: 76.3 mL/min (by C-G formula based on SCr of 0.8 mg/dL). Liver Function Tests: Recent Labs  Lab 04/26/17 2207 04/27/17 0421 04/28/17 0244  AST 39 35 52*  ALT 27 25 36  ALKPHOS 68 61 48  BILITOT 0.7 0.8 0.8  PROT 9.1* 8.2* 6.9  ALBUMIN 4.0 3.8 3.0*   No results for input(s): LIPASE, AMYLASE in the last 168 hours. No results for input(s): AMMONIA in the last 168 hours. Coagulation Profile: Recent Labs  Lab 04/26/17 2207  INR 1.04   Cardiac Enzymes: No results for input(s): CKTOTAL, CKMB, CKMBINDEX, TROPONINI in the last 168  hours. BNP (last 3 results) No results for input(s): PROBNP in the last 8760 hours. HbA1C: No results for input(s): HGBA1C in the last 72 hours. CBG: Recent Labs  Lab 04/28/17 0550 04/28/17 1105 04/28/17 1603 04/28/17 2133 04/29/17 0623  GLUCAP 113* 195* 119* 145* 177*   Lipid Profile: No results for input(s): CHOL, HDL, LDLCALC, TRIG, CHOLHDL, LDLDIRECT in the last 72 hours. Thyroid Function Tests: No results for input(s): TSH, T4TOTAL, FREET4, T3FREE, THYROIDAB in the last 72 hours. Anemia Panel: No results for input(s): VITAMINB12, FOLATE, FERRITIN, TIBC, IRON, RETICCTPCT in the last 72 hours. Sepsis Labs: Recent Labs  Lab 04/27/17 0059 04/27/17 0420 04/27/17 1248 04/27/17 2213  PROCALCITON 4.24  --   --   --   LATICACIDVEN 3.0* 6.1* 3.3* 3.0*    Recent Results (from the past 240 hour(s))  Culture, blood (Routine x 2)  Status: None (Preliminary result)   Collection Time: 04/26/17  9:56 PM  Result Value Ref Range Status   Specimen Description BLOOD RIGHT ARM  Final   Special Requests IN PEDIATRIC BOTTLE Blood Culture adequate volume  Final   Culture NO GROWTH 3 DAYS  Final   Report Status PENDING  Incomplete  Culture, blood (Routine x 2)     Status: None (Preliminary result)   Collection Time: 04/26/17 10:07 PM  Result Value Ref Range Status   Specimen Description BLOOD LEFT ARM  Final   Special Requests   Final    BOTTLES DRAWN AEROBIC AND ANAEROBIC Blood Culture adequate volume   Culture NO GROWTH 3 DAYS  Final   Report Status PENDING  Incomplete  Urine culture     Status: Abnormal   Collection Time: 04/26/17 10:17 PM  Result Value Ref Range Status   Specimen Description URINE, CATHETERIZED  Final   Special Requests NONE  Final   Culture MULTIPLE SPECIES PRESENT, SUGGEST RECOLLECTION (A)  Final   Report Status 04/28/2017 FINAL  Final  MRSA PCR Screening     Status: Abnormal   Collection Time: 04/28/17  7:18 PM  Result Value Ref Range Status   MRSA by  PCR INVALID RESULTS, SPECIMEN SENT FOR CULTURE (A) NEGATIVE Final    Comment: Brent General RN 0230 04/29/17 A BROWNING      Radiology Studies: No results found.    Scheduled Meds: . baclofen  20 mg Oral QID  . DULoxetine  60 mg Oral BID  . enoxaparin (LOVENOX) injection  40 mg Subcutaneous Daily  . ferrous sulfate  325 mg Oral QPC supper  . folic acid  1 mg Oral QPC supper  . gabapentin  1,600 mg Oral BID  . insulin aspart  0-9 Units Subcutaneous TID WC  . levothyroxine  125 mcg Oral QPC supper  . metoprolol tartrate  100 mg Oral BID  . morphine  30 mg Oral Q12H  . multivitamin with minerals  1 tablet Oral Daily  . NIFEdipine  30 mg Oral QPC supper  . zinc sulfate  220 mg Oral Daily   Continuous Infusions: . cefTRIAXone (ROCEPHIN)  IV 1 g (04/29/17 0921)     LOS: 3 days    Time spent: Total of 25 minutes spent with pt, greater than 50% of which was spent in discussion of  treatment, counseling and coordination of care    Chipper Oman, MD Pager: Text Page via [email or web address]   If 7PM-7AM, please contact night-coverage [email or web address] 04/29/2017, 10:07 AM

## 2017-04-30 DIAGNOSIS — G9340 Encephalopathy, unspecified: Secondary | ICD-10-CM

## 2017-04-30 LAB — MRSA CULTURE: Culture: NOT DETECTED

## 2017-04-30 LAB — GLUCOSE, CAPILLARY
GLUCOSE-CAPILLARY: 126 mg/dL — AB (ref 65–99)
Glucose-Capillary: 149 mg/dL — ABNORMAL HIGH (ref 65–99)

## 2017-04-30 MED ORDER — CEFPODOXIME PROXETIL 200 MG PO TABS: 200.0000 mg | ORAL_TABLET | Freq: Two times a day (BID) | ORAL | 0 refills | Status: AC

## 2017-04-30 NOTE — Care Management Important Message (Signed)
Important Message  Patient Details  Name: [name] MRN: [id number] Date of Birth: Mar 23, 1951   Medicare Important Message Given:  Yes    Debie Ashline 04/30/2017, 1:20 PM

## 2017-04-30 NOTE — Discharge Summary (Signed)
Physician Discharge Summary  [name]  HWE:[id number]  DOB: March 17, 1951  DOA: 04/26/2017 PCP: [name], MD  Admit date: 04/26/2017 Discharge date: 04/30/2017  Admitted From: Home  Disposition: Home   Recommendations for Outpatient Follow-up:  1. Follow up with PCP in 1-2 weeks 2. Follow up with urology in 4 weeks  3. Please obtain BMP/CBC in one week to monitor hgb and Cr  4. Please follow up on the following pending results: final blood cultures, so far no growth   Discharge Condition: Stable   CODE STATUS: Full Code   Diet recommendation: Heart Healthy   Brief/Interim Summary: For full details see H&P/progress notes, but in brief, [name] is 67 y/o F with PMH of paraplegia with suprapubic catheter and sacral decubitus ulcer, diabetes mellitus type 2, hypertension, hypothyroidism and chronic anemia and previous history of breast cancer in remission; admitted on1/20/2019, presented with complaint of confusion, was found to have acute encephalopathy from sepsis due to UTI.Suprapubic catheter was changed on 04/27/2017. Patient was started on empiric antibiotics and urine cultures were obtained which showed, multiple species.  Blood cultures so far negative patient clinically improved and deemed to be stable for discharge.  Subjective: Patient seen and examined, mentally she is back to baseline, remains afebrile for over 24 hours.  Tolerating diet well.  Denies chest pain, shortness of breath and abdominal pain.  Discharge Diagnoses/Hospital Course:  1.Sepsis due to UTI. - Sepsis physiology has resolved Catheter associated from chronic suprapubic catheter. Initially started on onIV vancomycin and Zosyn subsequently switched it to ceftriaxone Urine culture is growing multiple species. Prior culture has grown pansensitive E. Coli. Recently treated with Cipro on Christmas eve for 7 days for UTI by primary urologist. Outpatient catheter change was performed on 04/14/2017. CT  abdomen is negative for any significant abnormality. Will discharged on Vantin to complete 7 days of antibiotic treatment. Follow-up with urology in 4 weeks  2. Acute metabolic encephalopathy.- Resolved In setting of UTI and elevated blood pressure Mentally back to baseline  3. Chronic osteomyelitis/Sacral decubitus ulcer stage IV. POA Follow-up with outpatient wound care center On CT abdomen with evidence of chronic osteomyelitis, inflammatory markers unremarkable, no acute infection at this point.  4. Type 2 diabetes mellitus. Oral hypoglycemics were held during hospital stay CBG remained stable. Resume home medications without any changes.  5. Essential hypertension On admission blood pressure was significantly elevated, she was placed back on her home antihypertensive medication with good improvement of her blood pressure. Blood pressure remained stable during hospital stay, will resume home medications with no changes Follow-up with PCP to monitor BP  6. Hypothyroidism. Stable, continue Synthroid  7. History of breast cancer. In remission  8.Hypokalemia. Resolved  9. Paraplegia  Continue home medications   All other chronic medical condition were stable during the hospitalization.  On the day of the discharge the patient's vitals were stable, and no other acute medical condition were reported by patient. the patient was felt safe to be discharge to home  Discharge Instructions  You were cared for by a hospitalist during your hospital stay. If you have any questions about your discharge medications or the care you received while you were in the hospital after you are discharged, you can call the unit and asked to speak with the hospitalist on call if the hospitalist that took care of you is not available. Once you are discharged, your primary care physician will handle any further medical issues. Please note that NO REFILLS for any  discharge medications will  be authorized once you are discharged, as it is imperative that you return to your primary care physician (or establish a relationship with a primary care physician if you do not have one) for your aftercare needs so that they can reassess your need for medications and monitor your lab values.  Discharge Instructions    Call MD for:  difficulty breathing, headache or visual disturbances   Complete by:  As directed    Call MD for:  extreme fatigue   Complete by:  As directed    Call MD for:  hives   Complete by:  As directed    Call MD for:  persistant dizziness or light-headedness   Complete by:  As directed    Call MD for:  persistant nausea and vomiting   Complete by:  As directed    Call MD for:  redness, tenderness, or signs of infection (pain, swelling, redness, odor or green/yellow discharge around incision site)   Complete by:  As directed    Call MD for:  severe uncontrolled pain   Complete by:  As directed    Call MD for:  temperature >100.4   Complete by:  As directed    Diet - low sodium heart healthy   Complete by:  As directed    Increase activity slowly   Complete by:  As directed      Allergies as of 04/30/2017      Reactions   Ivp Dye [iodinated Diagnostic Agents] Other (See Comments)   "hot and sweaty and almost passed out"   Aspirin Hives   Sulfa Antibiotics Hives      Medication List    STOP taking these medications   pantoprazole 40 MG tablet Commonly known as:  PROTONIX     TAKE these medications   ascorbic acid 500 MG tablet Commonly known as:  VITAMIN C Take 1 tablet (500 mg total) by mouth daily.   baclofen 20 MG tablet Commonly known as:  LIORESAL Take 20 mg by mouth 4 (four) times daily. Take 1 tablet (20 mg) by mouth 4 times daily - 7am, 12pm, 4pm, 8pm   cefpodoxime 200 MG tablet Commonly known as:  VANTIN Take 1 tablet (200 mg total) by mouth 2 (two) times daily for 3 days. Start taking on:  05/01/2017   docusate sodium 100 MG  capsule Commonly known as:  COLACE Take 100 mg by mouth daily as needed (FOR CONSTIPATION).   DULoxetine 60 MG capsule Commonly known as:  CYMBALTA Take 60 mg by mouth 2 (two) times daily.   ferrous sulfate 325 (65 FE) MG tablet Take 325 mg by mouth daily after supper.   folic acid 1 MG tablet Commonly known as:  FOLVITE Take 1 mg by mouth daily after supper.   furosemide 40 MG tablet Commonly known as:  LASIX Take 40 mg by mouth daily after supper.   gabapentin 800 MG tablet Commonly known as:  NEURONTIN Take 1,600 mg by mouth 2 (two) times daily.   levothyroxine 125 MCG tablet Commonly known as:  SYNTHROID, LEVOTHROID Take 1 tablet (125 mcg total) by mouth daily before breakfast. What changed:  when to take this   metFORMIN 500 MG tablet Commonly known as:  GLUCOPHAGE Take 250 mg by mouth 2 (two) times daily with a meal.   metoprolol tartrate 100 MG tablet Commonly known as:  LOPRESSOR Take 100 mg by mouth 2 (two) times daily.   Morphine Sulfate ER 30 MG  T12a Take 30 mg by mouth 2 (two) times daily.   multivitamin with minerals Tabs tablet Take 1 tablet by mouth daily.   NIFEdipine 30 MG 24 hr tablet Commonly known as:  PROCARDIA-XL/ADALAT-CC/NIFEDICAL-XL Take 30 mg by mouth daily after supper.   oxyCODONE-acetaminophen 10-325 MG tablet Commonly known as:  PERCOCET Take 1 tablet by mouth every 4 (four) hours as needed for pain.   zinc sulfate 220 (50 Zn) MG capsule Take 1 capsule (220 mg total) by mouth daily.      Follow-up Information    [name], MD. Schedule an appointment as soon as possible for a visit in 1 week(s).   Specialty:  Internal Medicine Why:  Hospital follow up  Contact information: [place] [address] [phone]        Christy Sartorius, MD. Schedule an appointment as soon as possible for a visit in 4 week(s).   Specialty:  Urology Contact information: [place] [address] [phone]           Allergies  Allergen Reactions  . Ivp Dye [Iodinated Diagnostic Agents] Other (See Comments)    "hot and sweaty and almost passed out"  . Aspirin Hives  . Sulfa Antibiotics Hives    Consultations: None   Procedures/Studies: Ct Abdomen Pelvis Wo Contrast  Result Date: 04/27/2017 CLINICAL DATA:  Abdominal distention. History of breast cancer, hypertension, paraplegia, ischial sacral decubitus ulcer, and suprapubic catheter. Dark urine. IV contrast material allergy. EXAM: CT ABDOMEN AND PELVIS WITHOUT CONTRAST TECHNIQUE: Multidetector CT imaging of the abdomen and pelvis was performed following the standard protocol without IV contrast. COMPARISON:  10/28/2015 FINDINGS: Examination is limited due to motion artifact. Lower chest: Atelectasis in the lung bases. Small esophageal hiatal hernia. Hepatobiliary: Mild diffuse fatty infiltration of the liver. Gallbladder is distended. No stones or wall thickening identified. No bile duct dilatation. Pancreas: No acute process identified on noncontrast imaging. Spleen: Calcified granulomas in the spleen. Adrenals/Urinary Tract: No adrenal gland nodules. 10 mm diameter stone in the lower pole right kidney. No hydronephrosis or hydroureter. Suprapubic catheter deflate the bladder. Bladder wall thickening could be due to under distention or may indicate cystitis. Stomach/Bowel: Stomach and small bowel are decompressed. Diffusely stool-filled colon. No inflammatory stranding. Appendix is normal. Vascular/Lymphatic: Calcification of the aorta. No aneurysm. No significant lymphadenopathy. Reproductive: Status post hysterectomy. No adnexal masses. Other: Prominent lymph nodes in the groin regions bilaterally, likely reactive. Left ischial decubitus ulcer is incompletely included within the field of view. Small periumbilical hernia containing fat. No free air or free fluid in the abdomen. Musculoskeletal: Bone sclerosis with cortical loss underlying the ischial  decubitus ulcer on the left. This is consistent with chronic osteomyelitis. Degenerative changes in the lumbar spine and hips. Severe bone encroachment upon the central canal posteriorly at T10 level consistent with facet joint hypertrophy. IMPRESSION: 1. Mild diffuse fatty infiltration of the liver. Nonspecific gallbladder distention. No stones or inflammatory changes identified. 2. 10 mm nonobstructing stone in the lower pole right kidney. 3. Suprapubic catheter deflects the bladder. Bladder wall thickening could be due to under distention or cystitis. 4. Aortic atherosclerosis. 5. Left ischial decubitus ulcer extending to the ischium with evidence of chronic osteomyelitis. 6. Prominent degenerative changes in the lumbar spine. Electronically Signed   By: Lucienne Capers M.D.   On: 04/27/2017 01:45   Dg Chest Port 1 View  Result Date: 04/26/2017 CLINICAL DATA:  Abnormal urine. EXAM: PORTABLE CHEST 1 VIEW COMPARISON:  October 30, 2015 FINDINGS: The heart size and mediastinal contours are within normal limits. Both lungs are clear. The visualized skeletal structures are unremarkable. IMPRESSION: No active disease. Electronically Signed   By: Dorise Bullion III M.D   On: 04/26/2017 22:25    Discharge Exam: Vitals:   04/30/17 0458 04/30/17 0742  BP: 124/71 (!) 117/59  Pulse: 93 95  Resp: 16 16  Temp: 98.2 F (36.8 C) 98.1 F (36.7 C)  SpO2: 90% 92%   Vitals:   04/29/17 2011 04/29/17 2346 04/30/17 0458 04/30/17 0742  BP: (!) 143/76 (!) 144/74 124/71 (!) 117/59  Pulse: (!) 111 95 93 95  Resp: 17 17 16 16   Temp: 98.7 F (37.1 C) 99.7 F (37.6 C) 98.2 F (36.8 C) 98.1 F (36.7 C)  TempSrc: Oral Oral Oral Oral  SpO2: 94% 92% 90% 92%  Weight:      Height:        General: Pt is alert, awake, not in acute distress Cardiovascular: RRR, S1/S2 +, no rubs, no gallops Respiratory: Anterior auscultation clear bilaterally, no wheezing, no rhonchi Abdominal: Soft, NT, ND GU: Foley cath in place,  yellow urine  Extremities:Trace b/l LE edema, paraplegic    The results of significant diagnostics from this hospitalization (including imaging, microbiology, ancillary and laboratory) are listed below for reference.     Microbiology: Recent Results (from the past 240 hour(s))  Culture, blood (Routine x 2)     Status: None (Preliminary result)   Collection Time: 04/26/17  9:56 PM  Result Value Ref Range Status   Specimen Description BLOOD RIGHT ARM  Final   Special Requests IN PEDIATRIC BOTTLE Blood Culture adequate volume  Final   Culture NO GROWTH 3 DAYS  Final   Report Status PENDING  Incomplete  Culture, blood (Routine x 2)     Status: None (Preliminary result)   Collection Time: 04/26/17 10:07 PM  Result Value Ref Range Status   Specimen Description BLOOD LEFT ARM  Final   Special Requests   Final    BOTTLES DRAWN AEROBIC AND ANAEROBIC Blood Culture adequate volume   Culture NO GROWTH 3 DAYS  Final   Report Status PENDING  Incomplete  Urine culture     Status: Abnormal   Collection Time: 04/26/17 10:17 PM  Result Value Ref Range Status   Specimen Description URINE, CATHETERIZED  Final   Special Requests NONE  Final   Culture MULTIPLE SPECIES PRESENT, SUGGEST RECOLLECTION (A)  Final   Report Status 04/28/2017 FINAL  Final  MRSA PCR Screening     Status: Abnormal   Collection Time: 04/28/17  7:18 PM  Result Value Ref Range Status   MRSA by PCR INVALID RESULTS, SPECIMEN SENT FOR CULTURE (A) NEGATIVE Final    Comment: K DAVIDSON RN 0230 04/29/17 A BROWNING     Labs: BNP (last 3 results) No results for input(s): BNP in the last 8760 hours. Basic Metabolic Panel: Recent Labs  Lab 04/26/17 2207 04/27/17 0059 04/27/17 0421 04/28/17 0244 04/29/17 0420  NA 136  --  139 139 137  K 4.2  --  4.3 3.0* 4.1  CL 98*  --  103 108 105  CO2 24  --  20* 20* 22  GLUCOSE 160*  --  118* 138* 150*  BUN 26*  --  23* 12 9  CREATININE 1.01* 0.88 0.95 0.86 0.80  CALCIUM 9.5  --  8.9  8.4* 8.8*  MG  --   --   --  1.9 2.1   Liver Function Tests: Recent Labs  Lab 04/26/17 2207 04/27/17 0421 04/28/17 0244  AST 39 35 52*  ALT 27 25 36  ALKPHOS 68 61 48  BILITOT 0.7 0.8 0.8  PROT 9.1* 8.2* 6.9  ALBUMIN 4.0 3.8 3.0*   No results for input(s): LIPASE, AMYLASE in the last 168 hours. No results for input(s): AMMONIA in the last 168 hours. CBC: Recent Labs  Lab 04/26/17 2207 04/27/17 0059 04/27/17 0421 04/28/17 0244 04/29/17 0420  WBC 16.5* 16.3* 17.2* 9.6 8.8  NEUTROABS 15.0*  --  14.4* 7.9*  --   HGB 15.4* 14.4 17.5* 12.4 12.4  HCT 46.6* 44.3 54.9* 38.3 38.6  MCV 94.5 94.9 95.6 94.3 94.6  PLT 253 175 157 197 192   Cardiac Enzymes: No results for input(s): CKTOTAL, CKMB, CKMBINDEX, TROPONINI in the last 168 hours. BNP: Invalid input(s): POCBNP CBG: Recent Labs  Lab 04/29/17 0623 04/29/17 1128 04/29/17 1627 04/29/17 2123 04/30/17 0616  GLUCAP 177* 137* 106* 137* 126*   D-Dimer No results for input(s): DDIMER in the last 72 hours. Hgb A1c No results for input(s): HGBA1C in the last 72 hours. Lipid Profile No results for input(s): CHOL, HDL, LDLCALC, TRIG, CHOLHDL, LDLDIRECT in the last 72 hours. Thyroid function studies No results for input(s): TSH, T4TOTAL, T3FREE, THYROIDAB in the last 72 hours.  Invalid input(s): FREET3 Anemia work up No results for input(s): VITAMINB12, FOLATE, FERRITIN, TIBC, IRON, RETICCTPCT in the last 72 hours. Urinalysis    Component Value Date/Time   COLORURINE YELLOW 04/26/2017 2217   APPEARANCEUR HAZY (A) 04/26/2017 2217   LABSPEC 1.015 04/26/2017 2217   PHURINE 7.0 04/26/2017 2217   GLUCOSEU NEGATIVE 04/26/2017 2217   HGBUR MODERATE (A) 04/26/2017 2217   [place] NEGATIVE 04/26/2017 2217   [place] NEGATIVE 04/26/2017 2217   PROTEINUR 30 (A) 04/26/2017 2217   UROBILINOGEN 1.0 11/01/2014 1127   NITRITE NEGATIVE 04/26/2017 2217   LEUKOCYTESUR LARGE (A) 04/26/2017 2217   Sepsis Labs Invalid input(s):  PROCALCITONIN,  WBC,  LACTICIDVEN Microbiology Recent Results (from the past 240 hour(s))  Culture, blood (Routine x 2)     Status: None (Preliminary result)   Collection Time: 04/26/17  9:56 PM  Result Value Ref Range Status   Specimen Description BLOOD RIGHT ARM  Final   Special Requests IN PEDIATRIC BOTTLE Blood Culture adequate volume  Final   Culture NO GROWTH 3 DAYS  Final   Report Status PENDING  Incomplete  Culture, blood (Routine x 2)     Status: None (Preliminary result)   Collection Time: 04/26/17 10:07 PM  Result Value Ref Range Status   Specimen Description BLOOD LEFT ARM  Final   Special Requests   Final    BOTTLES DRAWN AEROBIC AND ANAEROBIC Blood Culture adequate volume   Culture NO GROWTH 3 DAYS  Final   Report Status PENDING  Incomplete  Urine culture     Status: Abnormal   Collection Time: 04/26/17 10:17 PM  Result Value Ref Range Status   Specimen Description URINE, CATHETERIZED  Final   Special Requests NONE  Final   Culture MULTIPLE SPECIES PRESENT, SUGGEST RECOLLECTION (A)  Final   Report Status 04/28/2017 FINAL  Final  MRSA PCR Screening     Status: Abnormal   Collection Time: 04/28/17  7:18 PM  Result Value Ref Range Status   MRSA by PCR INVALID RESULTS, SPECIMEN SENT FOR CULTURE (A) NEGATIVE Final    Comment: Brent General RN 0230 04/29/17 A BROWNING  Time coordinating discharge: 35 minutes  SIGNED:  Chipper Oman, MD  [facility] 04/30/2017, 8:39 AM  Pager please text page via  [email or web address]

## 2017-04-30 NOTE — Progress Notes (Signed)
Pt discharged to personal residence accompanied by her husband via handicapped mobility vehicle. Tele and IVs removed. Discharge instructions reviewed with pt and husband. No questions at this time. Scripts for antibiotic given to husband.

## 2017-04-30 NOTE — Care Management Note (Addendum)
Case Management Note Marvetta Gibbons RN, BSN Unit 4E-Case Manager [phone]  Patient Details  Name: [name] MRN: [id number] Date of Birth: 04-Dec-1950  Subjective/Objective:   Pt admitted with sepsis, AMS                 Action/Plan: PTA pt lived at home with spouse- hx of paraplegia with suprapubic cath. Orders placed for HHRN/aide- spoke with pt at bedside- per pt she already has bath aide that comes to assist and states that she does not feel HHRN would be beneficial at this time and politely declines [place] services- pt reports that she has all need DME at home and has a handicapped mobility Lucianne Lei- spouse to come provide transport home- pt does request assistance with her hospital bed at home that has an air mattress that is "flat" pt states that it is from [facility] and she has tried to have them come to look at it but has not had any success- CM will call [facility] to see about someone coming to look at mattress pt reports that she has only had it for about 2 months. Call made to [facility] with [facility] to have report sent to main office regarding home air mattress.   Expected Discharge Date:  04/30/17               Expected Discharge Plan:  [place]  In-House Referral:  NA  Discharge planning Services  CM Consult  Post Acute Care Choice:  Home Health Choice offered to:  Patient  DME Arranged:    DME Agency:     HH Arranged:  RN, aide [place] Agency:  [place]  Status of Service:  Completed, signed off  If discussed at [place] of Stay Meetings, dates discussed:    Discharge Disposition: home/Home Health  Additional Comments:  04/30/17- [place]- Korie Brabson RN, CM-- received call from Bedside RN- pt has changed her mind about the [facility]- would like [facility] to come out for wound care needs- per pt choice she would like [facility] to provide services- referral called to Butch Penny with [facility] for [facility] needs.   Dawayne Patricia, RN 04/30/2017, 10:14 AM

## 2017-05-01 LAB — CULTURE, BLOOD (ROUTINE X 2)
CULTURE: NO GROWTH
Culture: NO GROWTH
SPECIAL REQUESTS: ADEQUATE
Special Requests: ADEQUATE

## 2017-05-07 DIAGNOSIS — Z435 Encounter for attention to cystostomy: Secondary | ICD-10-CM | POA: Diagnosis not present

## 2017-05-07 DIAGNOSIS — E039 Hypothyroidism, unspecified: Secondary | ICD-10-CM | POA: Diagnosis not present

## 2017-05-07 DIAGNOSIS — D649 Anemia, unspecified: Secondary | ICD-10-CM | POA: Diagnosis not present

## 2017-05-07 DIAGNOSIS — Z7984 Long term (current) use of oral hypoglycemic drugs: Secondary | ICD-10-CM | POA: Diagnosis not present

## 2017-05-07 DIAGNOSIS — Z993 Dependence on wheelchair: Secondary | ICD-10-CM | POA: Diagnosis not present

## 2017-05-07 DIAGNOSIS — Z87891 Personal history of nicotine dependence: Secondary | ICD-10-CM | POA: Diagnosis not present

## 2017-05-07 DIAGNOSIS — Z9181 History of falling: Secondary | ICD-10-CM | POA: Diagnosis not present

## 2017-05-07 DIAGNOSIS — M1990 Unspecified osteoarthritis, unspecified site: Secondary | ICD-10-CM | POA: Diagnosis not present

## 2017-05-07 DIAGNOSIS — G8221 Paraplegia, complete: Secondary | ICD-10-CM | POA: Diagnosis not present

## 2017-05-07 DIAGNOSIS — I10 Essential (primary) hypertension: Secondary | ICD-10-CM | POA: Diagnosis not present

## 2017-05-07 DIAGNOSIS — E119 Type 2 diabetes mellitus without complications: Secondary | ICD-10-CM | POA: Diagnosis not present

## 2017-05-07 DIAGNOSIS — L89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, stage 4: Secondary | ICD-10-CM | POA: Diagnosis not present

## 2017-05-08 DIAGNOSIS — Z79899 Other long term (current) drug therapy: Secondary | ICD-10-CM | POA: Diagnosis not present

## 2017-05-08 DIAGNOSIS — I10 Essential (primary) hypertension: Secondary | ICD-10-CM | POA: Diagnosis not present

## 2017-05-08 DIAGNOSIS — E119 Type 2 diabetes mellitus without complications: Secondary | ICD-10-CM | POA: Diagnosis not present

## 2017-05-08 DIAGNOSIS — E039 Hypothyroidism, unspecified: Secondary | ICD-10-CM | POA: Diagnosis not present

## 2017-05-08 DIAGNOSIS — E782 Mixed hyperlipidemia: Secondary | ICD-10-CM | POA: Diagnosis not present

## 2017-05-08 DIAGNOSIS — E559 Vitamin D deficiency, unspecified: Secondary | ICD-10-CM | POA: Diagnosis not present

## 2017-05-14 ENCOUNTER — Encounter (HOSPITAL_BASED_OUTPATIENT_CLINIC_OR_DEPARTMENT_OTHER): Payer: PPO | Attending: Internal Medicine

## 2017-05-14 DIAGNOSIS — L89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, stage 4: Secondary | ICD-10-CM | POA: Diagnosis not present

## 2017-05-14 DIAGNOSIS — L97409 Non-pressure chronic ulcer of unspecified heel and midfoot with unspecified severity: Secondary | ICD-10-CM | POA: Diagnosis not present

## 2017-05-14 DIAGNOSIS — L97122 Non-pressure chronic ulcer of left thigh with fat layer exposed: Secondary | ICD-10-CM | POA: Diagnosis not present

## 2017-05-14 DIAGNOSIS — E119 Type 2 diabetes mellitus without complications: Secondary | ICD-10-CM | POA: Insufficient documentation

## 2017-05-14 DIAGNOSIS — G8221 Paraplegia, complete: Secondary | ICD-10-CM | POA: Diagnosis not present

## 2017-05-14 DIAGNOSIS — L89322 Pressure ulcer of left buttock, stage 2: Secondary | ICD-10-CM | POA: Diagnosis not present

## 2017-05-14 DIAGNOSIS — M8668 Other chronic osteomyelitis, other site: Secondary | ICD-10-CM | POA: Diagnosis not present

## 2017-05-14 DIAGNOSIS — L89892 Pressure ulcer of other site, stage 2: Secondary | ICD-10-CM | POA: Diagnosis not present

## 2017-05-14 DIAGNOSIS — F0390 Unspecified dementia without behavioral disturbance: Secondary | ICD-10-CM | POA: Diagnosis not present

## 2017-05-14 DIAGNOSIS — G8220 Paraplegia, unspecified: Secondary | ICD-10-CM | POA: Insufficient documentation

## 2017-05-14 DIAGNOSIS — Z923 Personal history of irradiation: Secondary | ICD-10-CM | POA: Insufficient documentation

## 2017-05-22 DIAGNOSIS — M47816 Spondylosis without myelopathy or radiculopathy, lumbar region: Secondary | ICD-10-CM | POA: Diagnosis not present

## 2017-05-22 DIAGNOSIS — M4712 Other spondylosis with myelopathy, cervical region: Secondary | ICD-10-CM | POA: Diagnosis not present

## 2017-05-22 DIAGNOSIS — G894 Chronic pain syndrome: Secondary | ICD-10-CM | POA: Diagnosis not present

## 2017-05-22 DIAGNOSIS — F329 Major depressive disorder, single episode, unspecified: Secondary | ICD-10-CM | POA: Diagnosis not present

## 2017-05-28 ENCOUNTER — Other Ambulatory Visit (HOSPITAL_COMMUNITY)
Admit: 2017-05-28 | Discharge: 2017-05-28 | Disposition: A | Discharge status: Home / Self Care | Payer: PPO | Source: Ambulatory Visit | Attending: Internal Medicine | Admitting: Internal Medicine

## 2017-05-28 DIAGNOSIS — M8668 Other chronic osteomyelitis, other site: Secondary | ICD-10-CM | POA: Diagnosis not present

## 2017-05-28 DIAGNOSIS — L89322 Pressure ulcer of left buttock, stage 2: Secondary | ICD-10-CM | POA: Diagnosis not present

## 2017-05-28 DIAGNOSIS — L97122 Non-pressure chronic ulcer of left thigh with fat layer exposed: Secondary | ICD-10-CM | POA: Diagnosis not present

## 2017-05-28 DIAGNOSIS — L89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, stage 4: Secondary | ICD-10-CM | POA: Diagnosis not present

## 2017-05-29 DIAGNOSIS — N319 Neuromuscular dysfunction of bladder, unspecified: Secondary | ICD-10-CM | POA: Diagnosis not present

## 2017-06-01 LAB — AEROBIC CULTURE W GRAM STAIN (SUPERFICIAL SPECIMEN)

## 2017-06-01 LAB — AEROBIC CULTURE  (SUPERFICIAL SPECIMEN): GRAM STAIN: NONE SEEN

## 2017-06-18 ENCOUNTER — Encounter (HOSPITAL_BASED_OUTPATIENT_CLINIC_OR_DEPARTMENT_OTHER): Payer: PPO | Attending: Internal Medicine

## 2017-06-18 DIAGNOSIS — B965 Pseudomonas (aeruginosa) (mallei) (pseudomallei) as the cause of diseases classified elsewhere: Secondary | ICD-10-CM | POA: Diagnosis not present

## 2017-06-18 DIAGNOSIS — L89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, stage 4: Secondary | ICD-10-CM | POA: Insufficient documentation

## 2017-06-18 DIAGNOSIS — I10 Essential (primary) hypertension: Secondary | ICD-10-CM | POA: Diagnosis not present

## 2017-06-18 DIAGNOSIS — G8220 Paraplegia, unspecified: Secondary | ICD-10-CM | POA: Insufficient documentation

## 2017-06-18 DIAGNOSIS — Z923 Personal history of irradiation: Secondary | ICD-10-CM | POA: Diagnosis not present

## 2017-06-18 DIAGNOSIS — L89892 Pressure ulcer of other site, stage 2: Secondary | ICD-10-CM | POA: Insufficient documentation

## 2017-06-18 DIAGNOSIS — F0390 Unspecified dementia without behavioral disturbance: Secondary | ICD-10-CM | POA: Insufficient documentation

## 2017-06-18 DIAGNOSIS — L89322 Pressure ulcer of left buttock, stage 2: Secondary | ICD-10-CM | POA: Diagnosis not present

## 2017-06-18 DIAGNOSIS — L97409 Non-pressure chronic ulcer of unspecified heel and midfoot with unspecified severity: Secondary | ICD-10-CM | POA: Diagnosis not present

## 2017-06-18 DIAGNOSIS — L97122 Non-pressure chronic ulcer of left thigh with fat layer exposed: Secondary | ICD-10-CM | POA: Diagnosis not present

## 2017-06-18 DIAGNOSIS — G8221 Paraplegia, complete: Secondary | ICD-10-CM | POA: Insufficient documentation

## 2017-06-19 DIAGNOSIS — F329 Major depressive disorder, single episode, unspecified: Secondary | ICD-10-CM | POA: Diagnosis not present

## 2017-06-19 DIAGNOSIS — G894 Chronic pain syndrome: Secondary | ICD-10-CM | POA: Diagnosis not present

## 2017-06-19 DIAGNOSIS — M4712 Other spondylosis with myelopathy, cervical region: Secondary | ICD-10-CM | POA: Diagnosis not present

## 2017-06-19 DIAGNOSIS — M47816 Spondylosis without myelopathy or radiculopathy, lumbar region: Secondary | ICD-10-CM | POA: Diagnosis not present

## 2017-06-26 DIAGNOSIS — N319 Neuromuscular dysfunction of bladder, unspecified: Secondary | ICD-10-CM | POA: Diagnosis not present

## 2017-07-09 ENCOUNTER — Encounter (HOSPITAL_BASED_OUTPATIENT_CLINIC_OR_DEPARTMENT_OTHER): Payer: PPO | Attending: Internal Medicine

## 2017-07-09 DIAGNOSIS — L89323 Pressure ulcer of left buttock, stage 3: Secondary | ICD-10-CM | POA: Diagnosis not present

## 2017-07-09 DIAGNOSIS — I10 Essential (primary) hypertension: Secondary | ICD-10-CM | POA: Insufficient documentation

## 2017-07-09 DIAGNOSIS — E119 Type 2 diabetes mellitus without complications: Secondary | ICD-10-CM | POA: Diagnosis not present

## 2017-07-09 DIAGNOSIS — Z923 Personal history of irradiation: Secondary | ICD-10-CM | POA: Diagnosis not present

## 2017-07-09 DIAGNOSIS — L89892 Pressure ulcer of other site, stage 2: Secondary | ICD-10-CM | POA: Insufficient documentation

## 2017-07-09 DIAGNOSIS — L89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, stage 4: Secondary | ICD-10-CM | POA: Insufficient documentation

## 2017-07-09 DIAGNOSIS — G8221 Paraplegia, complete: Secondary | ICD-10-CM | POA: Insufficient documentation

## 2017-07-09 DIAGNOSIS — F0390 Unspecified dementia without behavioral disturbance: Secondary | ICD-10-CM | POA: Insufficient documentation

## 2017-07-09 DIAGNOSIS — S71102A Unspecified open wound, left thigh, initial encounter: Secondary | ICD-10-CM | POA: Diagnosis not present

## 2017-07-17 DIAGNOSIS — M4712 Other spondylosis with myelopathy, cervical region: Secondary | ICD-10-CM | POA: Diagnosis not present

## 2017-07-17 DIAGNOSIS — F329 Major depressive disorder, single episode, unspecified: Secondary | ICD-10-CM | POA: Diagnosis not present

## 2017-07-17 DIAGNOSIS — M47816 Spondylosis without myelopathy or radiculopathy, lumbar region: Secondary | ICD-10-CM | POA: Diagnosis not present

## 2017-07-17 DIAGNOSIS — G894 Chronic pain syndrome: Secondary | ICD-10-CM | POA: Diagnosis not present

## 2017-08-01 ENCOUNTER — Emergency Department (HOSPITAL_COMMUNITY)
Admission: EM | Admit: 2017-08-01 | Discharge: 2017-08-01 | Disposition: A | Discharge status: Home / Self Care | Payer: PPO | Attending: Emergency Medicine | Admitting: Emergency Medicine

## 2017-08-01 ENCOUNTER — Encounter (HOSPITAL_COMMUNITY): Payer: Self-pay | Admitting: Emergency Medicine

## 2017-08-01 DIAGNOSIS — E119 Type 2 diabetes mellitus without complications: Secondary | ICD-10-CM | POA: Diagnosis not present

## 2017-08-01 DIAGNOSIS — T83090A Other mechanical complication of cystostomy catheter, initial encounter: Secondary | ICD-10-CM | POA: Insufficient documentation

## 2017-08-01 DIAGNOSIS — E039 Hypothyroidism, unspecified: Secondary | ICD-10-CM | POA: Insufficient documentation

## 2017-08-01 DIAGNOSIS — Z87891 Personal history of nicotine dependence: Secondary | ICD-10-CM | POA: Diagnosis not present

## 2017-08-01 DIAGNOSIS — Y658 Other specified misadventures during surgical and medical care: Secondary | ICD-10-CM | POA: Insufficient documentation

## 2017-08-01 DIAGNOSIS — Z7984 Long term (current) use of oral hypoglycemic drugs: Secondary | ICD-10-CM | POA: Insufficient documentation

## 2017-08-01 DIAGNOSIS — N390 Urinary tract infection, site not specified: Secondary | ICD-10-CM | POA: Diagnosis not present

## 2017-08-01 DIAGNOSIS — I10 Essential (primary) hypertension: Secondary | ICD-10-CM | POA: Diagnosis not present

## 2017-08-01 DIAGNOSIS — Z79899 Other long term (current) drug therapy: Secondary | ICD-10-CM | POA: Insufficient documentation

## 2017-08-01 DIAGNOSIS — T83198A Other mechanical complication of other urinary devices and implants, initial encounter: Secondary | ICD-10-CM | POA: Diagnosis not present

## 2017-08-01 LAB — URINALYSIS, ROUTINE W REFLEX MICROSCOPIC
BILIRUBIN URINE: NEGATIVE
GLUCOSE, UA: NEGATIVE mg/dL
KETONES UR: NEGATIVE mg/dL
NITRITE: NEGATIVE
PROTEIN: 100 mg/dL — AB
Specific Gravity, Urine: 1.021 (ref 1.005–1.030)
pH: 6 (ref 5.0–8.0)

## 2017-08-01 NOTE — ED Triage Notes (Signed)
Patient presents to ED for assessment after not being able to flush or drain her suprapubic catheter.  Patient having drainage near her rectum, but nothing from the tube.  Denies pain.  Issues began this morning.  Husband states urine is stronger smelling than normal as well.

## 2017-08-01 NOTE — ED Provider Notes (Signed)
[facility] EMERGENCY DEPARTMENT Provider Note   CSN: [id number] Arrival date & time: 08/01/17  1903     History   Chief Complaint Chief Complaint  Patient presents with  . Suprapubic Catheter    HPI [name] is a 67 y.o. female presenting for evaluation of her suprapubic catheter.  Patient states that she has had a suprapubic catheter for over a year.  She was supposed to have it changed last week, but her urologist was unable to make an appointment.  This morning, her catheter became clogged.  Her husband has tried to flush it without improvement.  There is been very little to no urine output into the catheter bag today.  Instead, she is urinating through her urethra, causing her sacral decubitus ulcer to get wet.  She denies hematuria.  She denies redness, warmth, or drainage from the site of the suprapubic catheter.  She reports her bladder feels full, but is nonpainful.  She reports 3 urine has been darker and smells different than normal.  This is been going on for the past several days.  She denies fevers, chills, chest pain, shortness breath, nausea, vomiting, upper abdominal pain.  She was on Cefdinir earlier this month for UTI, reports her urine improved with that.  She has been off abx for several weeks.  HPI  Past Medical History:  Diagnosis Date  . Anemia   . Arthritis    "all over" (04/13/2012)  . Cancer of right breast ([place]) 2011  . Cervical neuropathy    PERIPHERAL NEUROPATHY , HAD CERVICAL FUSION  . Diabetes mellitus without complication ([place])   . Difficult intubation    Fastrach #4 LMA then # 7 ETT used in 2006 [facility], cervical laminectomy)  . GERD (gastroesophageal reflux disease)   . History of kidney stones   . Hypercholesteremia   . Hypertension   . Hypothyroidism   . Kidney stones 1990's   "twice; passed in hospital on my own" (04/13/2012)  . Kidney stones 08/10/2015  . Paraplegia ([employer])   . PONV (postoperative nausea and  vomiting)   . Sacral decubitus ulcer 10/2014    Patient Active Problem List   Diagnosis Date Noted  . Diabetes mellitus type 2, controlled ([place]) 04/26/2017  . Urinary tract infection associated with cystostomy catheter ([place])   . Medication monitoring encounter 12/03/2015  . Hypertension   . History of nephrolithiasis   . AKI (acute kidney injury) ([place]) 08/10/2015  . Sepsis ([place]) 08/10/2015  . Hypotension 08/10/2015  . Altered mental status 08/10/2015  . Leukocytosis 08/10/2015  . Kidney stones 08/10/2015  . Acute kidney injury ([place])   . Encephalopathy acute   . Osteomyelitis ([place]) 11/06/2014  . Sacral decubitus ulcer   . Paraplegia ([place]) 11/01/2014  . Sacral ulcer ([place]) 10/31/2014  . Hypothyroidism 10/31/2014  . Breast cancer of lower-inner quadrant of right female breast ([place]) 12/22/2012  . HTN (hypertension) 04/15/2012  . Left knee DJD 04/13/2012    Class: Chronic    Past Surgical History:  Procedure Laterality Date  . ABDOMINAL HYSTERECTOMY  ~ 1976  . BACK SURGERY    . BREAST BIOPSY Right 2011  . BREAST LUMPECTOMY Right 2011  . CALDWELL LUC  ~ 2003   "benign tumor removed from up under gum" (04/13/2012)  . DACROCYSTORHINOSTOMY  ~ 2000   "put stents in my tear ducts; both eyes" (04/13/2012)  . EYE SURGERY  2004   STENTS TO BIL EYES  . I&D EXTREMITY Bilateral 11/03/2014  Procedure: IRRIGATION AND DEBRIDEMENT BILATERAL HEEL;  Surgeon: Irene Limbo, MD;  Location: [facility];  Service: Plastics;  Laterality: Bilateral;  . INCISION AND DRAINAGE OF WOUND Left 11/03/2014   Procedure: IRRIGATION AND DEBRIDEMENT SACRAL ULCER;  Surgeon: Irene Limbo, MD;  Location: [facility];  Service: Plastics;  Laterality: Left;  . JOINT REPLACEMENT    . POSTERIOR CERVICAL LAMINECTOMY  2006  . SKIN SPLIT GRAFT Left 08/05/2016   Procedure: SURGICAL PREP  FOR GRAFTING APPLICATION ACELL TO LEFT ISCHIUM;  Surgeon: Irene Limbo, MD;  Location: [facility];  Service: Plastics;  Laterality: Left;  .  TONSILLECTOMY AND ADENOIDECTOMY  ~ 1954  . TOTAL KNEE ARTHROPLASTY WITH REVISION COMPONENTS  04/13/2012   Procedure: TOTAL KNEE ARTHROPLASTY WITH REVISION COMPONENTS;  Surgeon: Hessie Dibble, MD;  Location: [place];  Service: Orthopedics;  Laterality: Left;     OB History   None      Home Medications    Prior to Admission medications   Medication Sig Start Date End Date Taking? Authorizing Provider  ascorbic acid (VITAMIN C) 500 MG tablet Take 1 tablet (500 mg total) by mouth daily. 11/07/14   Francesca Oman, DO  baclofen (LIORESAL) 20 MG tablet Take 20 mg by mouth 4 (four) times daily. Take 1 tablet (20 mg) by mouth 4 times daily - 7am, 12pm, 4pm, 8pm 09/21/15   [provider]  docusate sodium (COLACE) 100 MG capsule Take 100 mg by mouth daily as needed (FOR CONSTIPATION).     [provider]  DULoxetine (CYMBALTA) 60 MG capsule Take 60 mg by mouth 2 (two) times daily.     [provider]  ferrous sulfate 325 (65 FE) MG tablet Take 325 mg by mouth daily after supper.     [provider]  folic acid (FOLVITE) 1 MG tablet Take 1 mg by mouth daily after supper.     [provider]  furosemide (LASIX) 40 MG tablet Take 40 mg by mouth daily after supper.     [provider]  gabapentin (NEURONTIN) 800 MG tablet Take 1,600 mg by mouth 2 (two) times daily. 09/22/14   [provider]  levothyroxine (SYNTHROID, LEVOTHROID) 125 MCG tablet Take 1 tablet (125 mcg total) by mouth daily before breakfast. Patient taking differently: Take 125 mcg by mouth daily after supper.  11/07/14   Francesca Oman, DO  metFORMIN (GLUCOPHAGE) 500 MG tablet Take 250 mg by mouth 2 (two) times daily with a meal.    [provider]  metoprolol (LOPRESSOR) 100 MG tablet Take 100 mg by mouth 2 (two) times daily.  10/28/14   [provider]  Morphine Sulfate ER 30 MG T12A Take 30 mg by mouth 2 (two) times daily.    [provider]  Multiple  Vitamin (MULTIVITAMIN WITH MINERALS) TABS tablet Take 1 tablet by mouth daily. 11/07/14   Francesca Oman, DO  NIFEdipine (PROCARDIA XL/ADALAT-CC) 30 MG 24 hr tablet Take 30 mg by mouth daily after supper.     [provider]  oxyCODONE-acetaminophen (PERCOCET) 10-325 MG tablet Take 1 tablet by mouth every 4 (four) hours as needed for pain.    [provider]  zinc sulfate 220 MG capsule Take 1 capsule (220 mg total) by mouth daily. 11/07/14   Francesca Oman, DO    Family History Family History  Problem Relation Age of Onset  . Heart disease Father     Social History Social History   Tobacco Use  .  Smoking status: Former Smoker    Packs/day: 0.50    Years: 50.00    Pack years: 25.00    Types: Cigarettes    Last attempt to quit: 11/06/2014    Years since quitting: 2.7  . Smokeless tobacco: Never Used  Substance Use Topics  . Alcohol use: No  . Drug use: No     Allergies   Ivp dye [iodinated diagnostic agents]; Aspirin; and Sulfa antibiotics   Review of Systems Review of Systems  Constitutional: Negative for fever.  Gastrointestinal: Negative for nausea and vomiting.  Genitourinary:       Urine darkened with abnormal odor.  Suprapubic catheter clogged.  Skin: Negative for wound.     Physical Exam Updated Vital Signs BP (!) 167/78   Pulse 82   Temp 98.6 F (37 C) (Oral)   Resp 20   SpO2 96%   Physical Exam  Constitutional: She is oriented to person, place, and time. She appears well-developed and well-nourished. No distress.  HENT:  Head: Normocephalic and atraumatic.  Eyes: Pupils are equal, round, and reactive to light. Conjunctivae and EOM are normal.  Neck: Normal range of motion. Neck supple.  Cardiovascular: Normal rate, regular rhythm and intact distal pulses.  Pulmonary/Chest: Effort normal and breath sounds normal. No respiratory distress. She has no wheezes.  Abdominal: Soft. She exhibits no distension and no mass. There is no tenderness.  There is no rebound and no guarding.  Suprapubic catheter noted without surrounding erythema, warmth, or drainage.  No tenderness to palpation of the abdomen.  Musculoskeletal: Normal range of motion.  Neurological: She is alert and oriented to person, place, and time.  Skin: Skin is warm and dry.  Psychiatric: She has a normal mood and affect.  Nursing note and vitals reviewed.    ED Treatments / Results  Labs (all labs ordered are listed, but only abnormal results are displayed) Labs Reviewed  URINALYSIS, ROUTINE W REFLEX MICROSCOPIC - Abnormal; Notable for the following components:      Result Value   APPearance CLOUDY (*)    Hgb urine dipstick SMALL (*)    Protein, ur 100 (*)    Leukocytes, UA LARGE (*)    WBC, UA >50 (*)    Bacteria, UA RARE (*)    All other components within normal limits  URINE CULTURE    EKG None  Radiology No results found.  Procedures Procedures (including critical care time)  Medications Ordered in ED Medications - No data to display   Initial Impression / Assessment and Plan / ED Course  I have reviewed the triage vital signs and the nursing notes.  Pertinent labs & imaging results that were available during my care of the patient were reviewed by me and considered in my medical decision making (see chart for details).     Patient presenting for evaluation of her suprapubic catheter.  States it is been clogged today, despite trying to irrigate it.  Physical exam shows patient who is afebrile not tachycardic.  She appears nontoxic.  No sign of infection surrounding the suprapubic catheter.  Abdominal exam reassuring, soft and nontender.  Will attempt to irrigate, if not successful, will replace catheter. Case discussed with attending, Dr. Winfred Leeds evaluated the pt.   Irrigation unsuccessful.  Will replace catheter.  We do not have 20 [place] with a 10 cc inflation bulb, will replace with 20 [place] with 5 cc inflation level. Catheter  replaced in a sterile manner by Dr. Winfred Leeds.  Urine produced  from catheter is clear and without obvious signs of infection.  Will send for UA and culture.  UA with large leuks and greater than 50 white cells with rare bacteria.  This is improved from prior, where she has had TNTC white cells and many bacteria.  No nitrites.  As patient is without fever, chills, pain, and without dark or cloudy urine, will wait on urine culture before starting antibiotics.  She was recently treated for UTI earlier this month, I am concerned that frequent treatment can cause resistance.  Discussed with patient, and she agrees to plan.  Discussed that if she starts to notice a change in her urine or is concerned about infection, she should return to the ER or contact her urologist.  At this time, patient pursue for discharge.  Precautions given.  Patient states she understands and agrees to plan.   Final Clinical Impressions(s) / ED Diagnoses   Final diagnoses:  Obstruction of suprapubic catheter, initial encounter [facility])    ED Discharge Orders    None       Franchot Heidelberg, PA-C 08/02/17 0033    Orlie Dakin, MD 08/02/17 [phone]

## 2017-08-01 NOTE — Discharge Instructions (Addendum)
Continue taking your home medications as prescribed. Make sure you are staying well-hydrated with water. If the urine culture grows bacteria, you will receive a phone call. Come to the emergency room or call your urologist if you start to have fevers, urine becomes dark, or feel like you have a UTI. Return to the emergency room if you develop severe abdominal pain, high fevers, or any new or concerning symptoms.

## 2017-08-01 NOTE — ED Provider Notes (Addendum)
Patient with suprapubic catheter clogged since today.  Nurse unable to irrigate Foley.  There is a deep well-healed stage II decubitus ulcer at right buttock    Procedure note.  Timeout performed.  Suprapubic catheter placed by me 10:52 AM correct site identified patient identified by name.  A 20 French  Foley catheter was removed from the suprapubic area after balloon deflated.  A new 20 French Foley catheter with 5 mL balloon was inserted without difficulty.  5 mL balloon was inflated with 5 mL of sterile saline.  There was immediate good urine flow return .  Urine was grossly yellow Orlie Dakin, MD 08/01/17 5465    Orlie Dakin, MD 08/01/17 2259

## 2017-08-01 NOTE — ED Notes (Signed)
Patient was supposed to have catheter changed last week, but unable to make appointment with urologist.  Unable to provide sample until catheter unclogged, but will order

## 2017-08-03 LAB — URINE CULTURE

## 2017-08-06 ENCOUNTER — Encounter (HOSPITAL_BASED_OUTPATIENT_CLINIC_OR_DEPARTMENT_OTHER): Payer: PPO | Attending: Internal Medicine

## 2017-08-06 DIAGNOSIS — I10 Essential (primary) hypertension: Secondary | ICD-10-CM | POA: Diagnosis not present

## 2017-08-06 DIAGNOSIS — E11622 Type 2 diabetes mellitus with other skin ulcer: Secondary | ICD-10-CM | POA: Insufficient documentation

## 2017-08-06 DIAGNOSIS — L97222 Non-pressure chronic ulcer of left calf with fat layer exposed: Secondary | ICD-10-CM | POA: Insufficient documentation

## 2017-08-06 DIAGNOSIS — Z923 Personal history of irradiation: Secondary | ICD-10-CM | POA: Insufficient documentation

## 2017-08-06 DIAGNOSIS — L98412 Non-pressure chronic ulcer of buttock with fat layer exposed: Secondary | ICD-10-CM | POA: Diagnosis not present

## 2017-08-06 DIAGNOSIS — L89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, stage 4: Secondary | ICD-10-CM | POA: Insufficient documentation

## 2017-08-06 DIAGNOSIS — F0390 Unspecified dementia without behavioral disturbance: Secondary | ICD-10-CM | POA: Insufficient documentation

## 2017-08-06 DIAGNOSIS — G8221 Paraplegia, complete: Secondary | ICD-10-CM | POA: Diagnosis not present

## 2017-08-06 DIAGNOSIS — L97122 Non-pressure chronic ulcer of left thigh with fat layer exposed: Secondary | ICD-10-CM | POA: Diagnosis not present

## 2017-08-06 DIAGNOSIS — L97409 Non-pressure chronic ulcer of unspecified heel and midfoot with unspecified severity: Secondary | ICD-10-CM | POA: Diagnosis not present

## 2017-08-14 DIAGNOSIS — M4712 Other spondylosis with myelopathy, cervical region: Secondary | ICD-10-CM | POA: Diagnosis not present

## 2017-08-14 DIAGNOSIS — G894 Chronic pain syndrome: Secondary | ICD-10-CM | POA: Diagnosis not present

## 2017-08-14 DIAGNOSIS — F329 Major depressive disorder, single episode, unspecified: Secondary | ICD-10-CM | POA: Diagnosis not present

## 2017-08-14 DIAGNOSIS — M47816 Spondylosis without myelopathy or radiculopathy, lumbar region: Secondary | ICD-10-CM | POA: Diagnosis not present

## 2017-08-15 ENCOUNTER — Encounter (HOSPITAL_COMMUNITY): Payer: Self-pay | Admitting: Emergency Medicine

## 2017-08-15 ENCOUNTER — Other Ambulatory Visit: Payer: Self-pay

## 2017-08-15 ENCOUNTER — Emergency Department (HOSPITAL_COMMUNITY)
Admission: EM | Admit: 2017-08-15 | Discharge: 2017-08-15 | Disposition: A | Discharge status: Home / Self Care | Payer: PPO | Attending: Emergency Medicine | Admitting: Emergency Medicine

## 2017-08-15 DIAGNOSIS — Z87891 Personal history of nicotine dependence: Secondary | ICD-10-CM | POA: Insufficient documentation

## 2017-08-15 DIAGNOSIS — T83198A Other mechanical complication of other urinary devices and implants, initial encounter: Secondary | ICD-10-CM | POA: Diagnosis not present

## 2017-08-15 DIAGNOSIS — N390 Urinary tract infection, site not specified: Secondary | ICD-10-CM

## 2017-08-15 DIAGNOSIS — Z7984 Long term (current) use of oral hypoglycemic drugs: Secondary | ICD-10-CM | POA: Diagnosis not present

## 2017-08-15 DIAGNOSIS — E119 Type 2 diabetes mellitus without complications: Secondary | ICD-10-CM | POA: Diagnosis not present

## 2017-08-15 DIAGNOSIS — Z853 Personal history of malignant neoplasm of breast: Secondary | ICD-10-CM | POA: Diagnosis not present

## 2017-08-15 DIAGNOSIS — Z79899 Other long term (current) drug therapy: Secondary | ICD-10-CM | POA: Insufficient documentation

## 2017-08-15 DIAGNOSIS — Y738 Miscellaneous gastroenterology and urology devices associated with adverse incidents, not elsewhere classified: Secondary | ICD-10-CM | POA: Insufficient documentation

## 2017-08-15 DIAGNOSIS — E039 Hypothyroidism, unspecified: Secondary | ICD-10-CM | POA: Diagnosis not present

## 2017-08-15 DIAGNOSIS — T83090A Other mechanical complication of cystostomy catheter, initial encounter: Secondary | ICD-10-CM | POA: Diagnosis not present

## 2017-08-15 DIAGNOSIS — T83510A Infection and inflammatory reaction due to cystostomy catheter, initial encounter: Secondary | ICD-10-CM | POA: Diagnosis not present

## 2017-08-15 DIAGNOSIS — Y648 Contaminated medical or biological substance administered by other means: Secondary | ICD-10-CM | POA: Diagnosis not present

## 2017-08-15 LAB — URINALYSIS, ROUTINE W REFLEX MICROSCOPIC
Bilirubin Urine: NEGATIVE
Glucose, UA: NEGATIVE mg/dL
Ketones, ur: NEGATIVE mg/dL
Nitrite: NEGATIVE
PH: 6 (ref 5.0–8.0)
Protein, ur: 30 mg/dL — AB
RBC / HPF: >50 RBC/hpf — ABNORMAL HIGH (ref 0–5)
SPECIFIC GRAVITY, URINE: 1.012 (ref 1.005–1.030)

## 2017-08-15 MED ORDER — CIPROFLOXACIN HCL 500 MG PO TABS: 500.0000 mg | ORAL_TABLET | Freq: Two times a day (BID) | ORAL | 0 refills | Status: DC

## 2017-08-15 MED ORDER — CIPROFLOXACIN HCL 500 MG PO TABS
500.0000 mg | ORAL_TABLET | Freq: Once | ORAL | Status: AC
  Administered 2017-08-15: 500 mg via ORAL
  Filled 2017-08-15: qty 1

## 2017-08-15 NOTE — ED Provider Notes (Signed)
[facility] EMERGENCY DEPARTMENT Provider Note   CSN: [id number] Arrival date & time: 08/15/17  6295     History   Chief Complaint Chief Complaint  Patient presents with  . suprapubic catheter clogged    HPI [name] is a 67 y.o. female.  Pt presents to the ED today with her suprapubic catheter clogged.  Pt's husband tried to flush it without success.  The pt did have to come to the ED on 4/28 for the same.  The pt has not been to see urology since that visit, but has an appt in the next week.  The pt said she woke up around 0300 with her catheter clogged.  Urine was coming out of her urethra instead.  The pt has not had a fever.  No pain.     Past Medical History:  Diagnosis Date  . Anemia   . Arthritis    "all over" (04/13/2012)  . Cancer of right breast ([place]) 2011  . Cervical neuropathy    PERIPHERAL NEUROPATHY , HAD CERVICAL FUSION  . Diabetes mellitus without complication ([place])   . Difficult intubation    Fastrach #4 LMA then # 7 ETT used in 2006 [facility], cervical laminectomy)  . GERD (gastroesophageal reflux disease)   . History of kidney stones   . Hypercholesteremia   . Hypertension   . Hypothyroidism   . Kidney stones 1990's   "twice; passed in hospital on my own" (04/13/2012)  . Kidney stones 08/10/2015  . Paraplegia ([place])   . PONV (postoperative nausea and vomiting)   . Sacral decubitus ulcer 10/2014    Patient Active Problem List   Diagnosis Date Noted  . Diabetes mellitus type 2, controlled ([place]) 04/26/2017  . Urinary tract infection associated with cystostomy catheter ([place])   . Medication monitoring encounter 12/03/2015  . Hypertension   . History of nephrolithiasis   . AKI (acute kidney injury) ([place]) 08/10/2015  . Sepsis ([place]) 08/10/2015  . Hypotension 08/10/2015  . Altered mental status 08/10/2015  . Leukocytosis 08/10/2015  . Kidney stones 08/10/2015  . Acute kidney injury ([place])   . Encephalopathy acute   .  Osteomyelitis ([place]) 11/06/2014  . Sacral decubitus ulcer   . Paraplegia ([place]) 11/01/2014  . Sacral ulcer ([place]) 10/31/2014  . Hypothyroidism 10/31/2014  . Breast cancer of lower-inner quadrant of right female breast ([place]) 12/22/2012  . HTN (hypertension) 04/15/2012  . Left knee DJD 04/13/2012    Class: Chronic    Past Surgical History:  Procedure Laterality Date  . ABDOMINAL HYSTERECTOMY  ~ 1976  . BACK SURGERY    . BREAST BIOPSY Right 2011  . BREAST LUMPECTOMY Right 2011  . CALDWELL LUC  ~ 2003   "benign tumor removed from up under gum" (04/13/2012)  . DACROCYSTORHINOSTOMY  ~ 2000   "put stents in my tear ducts; both eyes" (04/13/2012)  . EYE SURGERY  2004   STENTS TO BIL EYES  . I&D EXTREMITY Bilateral 11/03/2014   Procedure: IRRIGATION AND DEBRIDEMENT BILATERAL HEEL;  Surgeon: Irene Limbo, MD;  Location: [facility];  Service: Plastics;  Laterality: Bilateral;  . INCISION AND DRAINAGE OF WOUND Left 11/03/2014   Procedure: IRRIGATION AND DEBRIDEMENT SACRAL ULCER;  Surgeon: Irene Limbo, MD;  Location: [facility];  Service: Plastics;  Laterality: Left;  . JOINT REPLACEMENT    . POSTERIOR CERVICAL LAMINECTOMY  2006  . SKIN SPLIT GRAFT Left 08/05/2016   Procedure: SURGICAL PREP  FOR GRAFTING APPLICATION ACELL TO LEFT ISCHIUM;  Surgeon: Irene Limbo, MD;  Location: [facility];  Service: Plastics;  Laterality: Left;  . TONSILLECTOMY AND ADENOIDECTOMY  ~ 1954  . TOTAL KNEE ARTHROPLASTY WITH REVISION COMPONENTS  04/13/2012   Procedure: TOTAL KNEE ARTHROPLASTY WITH REVISION COMPONENTS;  Surgeon: Hessie Dibble, MD;  Location: [facility];  Service: Orthopedics;  Laterality: Left;     OB History   None      Home Medications    Prior to Admission medications   Medication Sig Start Date End Date Taking? Authorizing Provider  ascorbic acid (VITAMIN C) 500 MG tablet Take 1 tablet (500 mg total) by mouth daily. 11/07/14   Francesca Oman, DO  baclofen (LIORESAL) 20 MG tablet Take 20 mg by mouth 4  (four) times daily. Take 1 tablet (20 mg) by mouth 4 times daily - 7am, 12pm, 4pm, 8pm 09/21/15   [provider]  ciprofloxacin (CIPRO) 500 MG tablet Take 1 tablet (500 mg total) by mouth 2 (two) times daily. 08/15/17   Isla Pence, MD  docusate sodium (COLACE) 100 MG capsule Take 100 mg by mouth daily as needed (FOR CONSTIPATION).     [provider]  DULoxetine (CYMBALTA) 60 MG capsule Take 60 mg by mouth 2 (two) times daily.     [provider]  ferrous sulfate 325 (65 FE) MG tablet Take 325 mg by mouth daily after supper.     [provider]  folic acid (FOLVITE) 1 MG tablet Take 1 mg by mouth daily after supper.     [provider]  furosemide (LASIX) 40 MG tablet Take 40 mg by mouth daily after supper.     [provider]  gabapentin (NEURONTIN) 800 MG tablet Take 1,600 mg by mouth 2 (two) times daily. 09/22/14   [provider]  levothyroxine (SYNTHROID, LEVOTHROID) 125 MCG tablet Take 1 tablet (125 mcg total) by mouth daily before breakfast. Patient taking differently: Take 125 mcg by mouth daily after supper.  11/07/14   Francesca Oman, DO  metFORMIN (GLUCOPHAGE) 500 MG tablet Take 250 mg by mouth 2 (two) times daily with a meal.    [provider]  metoprolol (LOPRESSOR) 100 MG tablet Take 100 mg by mouth 2 (two) times daily.  10/28/14   [provider]  Morphine Sulfate ER 30 MG T12A Take 30 mg by mouth 2 (two) times daily.    [provider]  Multiple Vitamin (MULTIVITAMIN WITH MINERALS) TABS tablet Take 1 tablet by mouth daily. 11/07/14   Francesca Oman, DO  NIFEdipine (PROCARDIA XL/ADALAT-CC) 30 MG 24 hr tablet Take 30 mg by mouth daily after supper.     [provider]  oxyCODONE-acetaminophen (PERCOCET) 10-325 MG tablet Take 1 tablet by mouth every 4 (four) hours as needed for pain.    [provider]  zinc sulfate 220 MG capsule Take 1 capsule (220 mg total) by mouth daily. 11/07/14    Francesca Oman, DO    Family History Family History  Problem Relation Age of Onset  . Heart disease Father     Social History Social History   Tobacco Use  . Smoking status: Former Smoker    Packs/day: 0.50    Years: 50.00    Pack years: 25.00    Types: Cigarettes    Last attempt to quit: 11/06/2014    Years since quitting: 2.7  . Smokeless tobacco: Never Used  Substance Use Topics  . Alcohol use: No  . Drug use: No  Allergies   Ivp dye [iodinated diagnostic agents]; Aspirin; and Sulfa antibiotics   Review of Systems Review of Systems  Genitourinary:       Suprapubic catheter  All other systems reviewed and are negative.    Physical Exam Updated Vital Signs BP (!) 153/75   Pulse 98   Temp 99.1 F (37.3 C) (Oral)   Resp 18   SpO2 98%   Physical Exam  Constitutional: She is oriented to person, place, and time. She appears well-developed and well-nourished.  HENT:  Head: Normocephalic and atraumatic.  Right Ear: External ear normal.  Left Ear: External ear normal.  Nose: Nose normal.  Mouth/Throat: Oropharynx is clear and moist.  Eyes: Pupils are equal, round, and reactive to light. Conjunctivae and EOM are normal.  Neck: Normal range of motion. Neck supple.  Cardiovascular: Normal rate, regular rhythm, normal heart sounds and intact distal pulses.  Pulmonary/Chest: Effort normal and breath sounds normal.  Abdominal: Soft. Bowel sounds are normal.  Suprapubic catheter in place.  No drainage.  Musculoskeletal: Normal range of motion.  Neurological: She is alert and oriented to person, place, and time.  Skin: Skin is warm. Capillary refill takes less than 2 seconds.  Psychiatric: She has a normal mood and affect. Her behavior is normal. Judgment and thought content normal.  Nursing note and vitals reviewed.    ED Treatments / Results  Labs (all labs ordered are listed, but only abnormal results are displayed) Labs Reviewed  URINALYSIS, ROUTINE W  REFLEX MICROSCOPIC - Abnormal; Notable for the following components:      Result Value   APPearance HAZY (*)    Hgb urine dipstick LARGE (*)    Protein, ur 30 (*)    Leukocytes, UA LARGE (*)    RBC / HPF >50 (*)    WBC, UA >50 (*)    Bacteria, UA RARE (*)    All other components within normal limits  URINE CULTURE    EKG None  Radiology No results found.  Procedures BLADDER CATHETERIZATION Date/Time: 08/15/2017 7:44 AM Performed by: Isla Pence, MD Authorized by: Isla Pence, MD   Consent:    Consent obtained:  Verbal   Consent given by:  Patient   Risks discussed:  Pain   Alternatives discussed:  No treatment Pre-procedure details:    Procedure purpose:  Therapeutic Anesthesia (see MAR for exact dosages):    Anesthesia method:  None Procedure details:    Provider performed due to:  Complicated insertion   Catheter insertion:  Indwelling   Catheter type:  Coude   Catheter size:  20 Fr   Number of attempts:  1   Urine characteristics:  Clear Post-procedure details:    Patient tolerance of procedure:  Tolerated well, no immediate complications Comments:     Suprapubic catheter changed under sterile technique.  Immediate urine return.  5 cc of NS applied to balloon.   (including critical care time)  Medications Ordered in ED Medications  ciprofloxacin (CIPRO) tablet 500 mg (has no administration in time range)     Initial Impression / Assessment and Plan / ED Course  I have reviewed the triage vital signs and the nursing notes.  Pertinent labs & imaging results that were available during my care of the patient were reviewed by me and considered in my medical decision making (see chart for details).     Pt is doing well.  Her last several urine cultures have grown out multiple species.  She said cipro has  worked well for her in the past, so I will put her on that.  Pt is stable for d/c and instructed to f/u with urology.  Final Clinical Impressions(s)  / ED Diagnoses   Final diagnoses:  Blocked suprapubic catheter, initial encounter [facility])  Urinary tract infection associated with cystostomy catheter, initial encounter [facility])    ED Discharge Orders        Ordered    ciprofloxacin (CIPRO) 500 MG tablet  2 times daily     08/15/17 0818       Isla Pence, MD 08/15/17 [phone]

## 2017-08-15 NOTE — ED Triage Notes (Signed)
Pt reports obstruction of her suprapubic catheter for 3-4 hours.  Denies pain.  States she was seen here approx 2 weeks ago for same.

## 2017-08-15 NOTE — ED Notes (Signed)
MD in room to change catheter.

## 2017-08-17 LAB — URINE CULTURE

## 2017-08-18 ENCOUNTER — Telehealth: Payer: Self-pay | Admitting: Emergency Medicine

## 2017-08-18 DIAGNOSIS — E119 Type 2 diabetes mellitus without complications: Secondary | ICD-10-CM | POA: Diagnosis not present

## 2017-08-18 DIAGNOSIS — Z0000 Encounter for general adult medical examination without abnormal findings: Secondary | ICD-10-CM | POA: Diagnosis not present

## 2017-08-18 DIAGNOSIS — E7800 Pure hypercholesterolemia, unspecified: Secondary | ICD-10-CM | POA: Diagnosis not present

## 2017-08-18 DIAGNOSIS — E039 Hypothyroidism, unspecified: Secondary | ICD-10-CM | POA: Diagnosis not present

## 2017-08-18 DIAGNOSIS — E559 Vitamin D deficiency, unspecified: Secondary | ICD-10-CM | POA: Diagnosis not present

## 2017-08-18 NOTE — Telephone Encounter (Signed)
Post ED Visit - Positive Culture Follow-up: Successful Patient Follow-Up  Culture assessed and recommendations reviewed by: [x]  Elenor Quinones, Pharm.D. []  Heide Guile, Pharm.D., BCPS AQ-ID []  Parks Neptune, Pharm.D., BCPS []  Alycia Rossetti, Pharm.D., BCPS []  [place], [place].D., BCPS, AAHIVP []  Legrand Como, Pharm.D., BCPS, AAHIVP []  Salome Arnt, PharmD, BCPS []  Wynell Balloon, PharmD []  Vincenza Hews, PharmD, BCPS  Positive urine culture  []  Patient discharged without antimicrobial prescription and treatment is now indicated [x]  Organism is resistant to prescribed ED discharge antimicrobial []  Patient with positive blood cultures  Changes discussed with ED provider: Coral Ceo PA New antibiotic prescription stop Cipro, start fosfomycin 3 g po by mouth x 1 Called to [facility] [phone]  Contacted patient, 08/18/2017 1331   Hazle Nordmann 08/18/2017, 1:28 PM

## 2017-08-27 DIAGNOSIS — N390 Urinary tract infection, site not specified: Secondary | ICD-10-CM | POA: Diagnosis not present

## 2017-08-27 DIAGNOSIS — N319 Neuromuscular dysfunction of bladder, unspecified: Secondary | ICD-10-CM | POA: Diagnosis not present

## 2017-09-03 DIAGNOSIS — L97122 Non-pressure chronic ulcer of left thigh with fat layer exposed: Secondary | ICD-10-CM | POA: Diagnosis not present

## 2017-09-03 DIAGNOSIS — L97409 Non-pressure chronic ulcer of unspecified heel and midfoot with unspecified severity: Secondary | ICD-10-CM | POA: Diagnosis not present

## 2017-09-03 DIAGNOSIS — L89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, stage 4: Secondary | ICD-10-CM | POA: Diagnosis not present

## 2017-09-17 DIAGNOSIS — F329 Major depressive disorder, single episode, unspecified: Secondary | ICD-10-CM | POA: Diagnosis not present

## 2017-09-17 DIAGNOSIS — M47816 Spondylosis without myelopathy or radiculopathy, lumbar region: Secondary | ICD-10-CM | POA: Diagnosis not present

## 2017-09-17 DIAGNOSIS — M4712 Other spondylosis with myelopathy, cervical region: Secondary | ICD-10-CM | POA: Diagnosis not present

## 2017-09-17 DIAGNOSIS — G894 Chronic pain syndrome: Secondary | ICD-10-CM | POA: Diagnosis not present

## 2017-09-23 DIAGNOSIS — N319 Neuromuscular dysfunction of bladder, unspecified: Secondary | ICD-10-CM | POA: Diagnosis not present

## 2017-10-01 ENCOUNTER — Encounter (HOSPITAL_BASED_OUTPATIENT_CLINIC_OR_DEPARTMENT_OTHER): Payer: PPO | Attending: Internal Medicine

## 2017-10-01 DIAGNOSIS — L89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, stage 4: Secondary | ICD-10-CM | POA: Diagnosis not present

## 2017-10-01 DIAGNOSIS — S71102A Unspecified open wound, left thigh, initial encounter: Secondary | ICD-10-CM | POA: Diagnosis not present

## 2017-10-06 DIAGNOSIS — L97409 Non-pressure chronic ulcer of unspecified heel and midfoot with unspecified severity: Secondary | ICD-10-CM | POA: Diagnosis not present

## 2017-10-15 DIAGNOSIS — F329 Major depressive disorder, single episode, unspecified: Secondary | ICD-10-CM | POA: Diagnosis not present

## 2017-10-15 DIAGNOSIS — M4712 Other spondylosis with myelopathy, cervical region: Secondary | ICD-10-CM | POA: Diagnosis not present

## 2017-10-15 DIAGNOSIS — M47816 Spondylosis without myelopathy or radiculopathy, lumbar region: Secondary | ICD-10-CM | POA: Diagnosis not present

## 2017-10-15 DIAGNOSIS — G894 Chronic pain syndrome: Secondary | ICD-10-CM | POA: Diagnosis not present

## 2017-10-23 DIAGNOSIS — N319 Neuromuscular dysfunction of bladder, unspecified: Secondary | ICD-10-CM | POA: Diagnosis not present

## 2017-10-23 DIAGNOSIS — Z466 Encounter for fitting and adjustment of urinary device: Secondary | ICD-10-CM | POA: Diagnosis not present

## 2017-10-29 ENCOUNTER — Encounter (HOSPITAL_BASED_OUTPATIENT_CLINIC_OR_DEPARTMENT_OTHER): Payer: PPO | Attending: Internal Medicine

## 2017-10-29 DIAGNOSIS — Z923 Personal history of irradiation: Secondary | ICD-10-CM | POA: Diagnosis not present

## 2017-10-29 DIAGNOSIS — F0390 Unspecified dementia without behavioral disturbance: Secondary | ICD-10-CM | POA: Diagnosis not present

## 2017-10-29 DIAGNOSIS — L89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, stage 4: Secondary | ICD-10-CM | POA: Diagnosis not present

## 2017-10-29 DIAGNOSIS — G8220 Paraplegia, unspecified: Secondary | ICD-10-CM | POA: Diagnosis not present

## 2017-10-29 DIAGNOSIS — I10 Essential (primary) hypertension: Secondary | ICD-10-CM | POA: Diagnosis not present

## 2017-10-29 DIAGNOSIS — G8221 Paraplegia, complete: Secondary | ICD-10-CM | POA: Diagnosis not present

## 2017-10-30 DIAGNOSIS — L97409 Non-pressure chronic ulcer of unspecified heel and midfoot with unspecified severity: Secondary | ICD-10-CM | POA: Diagnosis not present

## 2017-11-12 DIAGNOSIS — M4712 Other spondylosis with myelopathy, cervical region: Secondary | ICD-10-CM | POA: Diagnosis not present

## 2017-11-12 DIAGNOSIS — F329 Major depressive disorder, single episode, unspecified: Secondary | ICD-10-CM | POA: Diagnosis not present

## 2017-11-12 DIAGNOSIS — G894 Chronic pain syndrome: Secondary | ICD-10-CM | POA: Diagnosis not present

## 2017-11-12 DIAGNOSIS — M47816 Spondylosis without myelopathy or radiculopathy, lumbar region: Secondary | ICD-10-CM | POA: Diagnosis not present

## 2017-11-19 DIAGNOSIS — Z79891 Long term (current) use of opiate analgesic: Secondary | ICD-10-CM | POA: Diagnosis not present

## 2017-11-23 DIAGNOSIS — Z466 Encounter for fitting and adjustment of urinary device: Secondary | ICD-10-CM | POA: Diagnosis not present

## 2017-11-23 DIAGNOSIS — N319 Neuromuscular dysfunction of bladder, unspecified: Secondary | ICD-10-CM | POA: Diagnosis not present

## 2017-11-26 ENCOUNTER — Encounter (HOSPITAL_BASED_OUTPATIENT_CLINIC_OR_DEPARTMENT_OTHER): Payer: PPO | Attending: Internal Medicine

## 2017-11-26 DIAGNOSIS — G8221 Paraplegia, complete: Secondary | ICD-10-CM | POA: Insufficient documentation

## 2017-11-26 DIAGNOSIS — Z923 Personal history of irradiation: Secondary | ICD-10-CM | POA: Diagnosis not present

## 2017-11-26 DIAGNOSIS — I10 Essential (primary) hypertension: Secondary | ICD-10-CM | POA: Insufficient documentation

## 2017-11-26 DIAGNOSIS — F0390 Unspecified dementia without behavioral disturbance: Secondary | ICD-10-CM | POA: Diagnosis not present

## 2017-11-26 DIAGNOSIS — L89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, stage 4: Secondary | ICD-10-CM | POA: Diagnosis not present

## 2017-11-26 DIAGNOSIS — L97409 Non-pressure chronic ulcer of unspecified heel and midfoot with unspecified severity: Secondary | ICD-10-CM | POA: Diagnosis not present

## 2017-11-26 DIAGNOSIS — G8220 Paraplegia, unspecified: Secondary | ICD-10-CM | POA: Diagnosis not present

## 2017-12-15 DIAGNOSIS — Z23 Encounter for immunization: Secondary | ICD-10-CM | POA: Diagnosis not present

## 2017-12-15 DIAGNOSIS — E782 Mixed hyperlipidemia: Secondary | ICD-10-CM | POA: Diagnosis not present

## 2017-12-15 DIAGNOSIS — E119 Type 2 diabetes mellitus without complications: Secondary | ICD-10-CM | POA: Diagnosis not present

## 2017-12-15 DIAGNOSIS — E039 Hypothyroidism, unspecified: Secondary | ICD-10-CM | POA: Diagnosis not present

## 2017-12-15 DIAGNOSIS — I10 Essential (primary) hypertension: Secondary | ICD-10-CM | POA: Diagnosis not present

## 2017-12-17 DIAGNOSIS — M47816 Spondylosis without myelopathy or radiculopathy, lumbar region: Secondary | ICD-10-CM | POA: Diagnosis not present

## 2017-12-17 DIAGNOSIS — M4712 Other spondylosis with myelopathy, cervical region: Secondary | ICD-10-CM | POA: Diagnosis not present

## 2017-12-17 DIAGNOSIS — F329 Major depressive disorder, single episode, unspecified: Secondary | ICD-10-CM | POA: Diagnosis not present

## 2017-12-17 DIAGNOSIS — G894 Chronic pain syndrome: Secondary | ICD-10-CM | POA: Diagnosis not present

## 2017-12-24 ENCOUNTER — Encounter (HOSPITAL_BASED_OUTPATIENT_CLINIC_OR_DEPARTMENT_OTHER): Payer: PPO | Attending: Internal Medicine

## 2017-12-24 DIAGNOSIS — F0390 Unspecified dementia without behavioral disturbance: Secondary | ICD-10-CM | POA: Insufficient documentation

## 2017-12-24 DIAGNOSIS — G8221 Paraplegia, complete: Secondary | ICD-10-CM | POA: Insufficient documentation

## 2017-12-24 DIAGNOSIS — Z923 Personal history of irradiation: Secondary | ICD-10-CM | POA: Insufficient documentation

## 2017-12-24 DIAGNOSIS — I10 Essential (primary) hypertension: Secondary | ICD-10-CM | POA: Insufficient documentation

## 2017-12-24 DIAGNOSIS — L89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, stage 4: Secondary | ICD-10-CM | POA: Insufficient documentation

## 2017-12-29 DIAGNOSIS — N319 Neuromuscular dysfunction of bladder, unspecified: Secondary | ICD-10-CM | POA: Diagnosis not present

## 2017-12-29 DIAGNOSIS — Z466 Encounter for fitting and adjustment of urinary device: Secondary | ICD-10-CM | POA: Diagnosis not present

## 2017-12-31 DIAGNOSIS — L97409 Non-pressure chronic ulcer of unspecified heel and midfoot with unspecified severity: Secondary | ICD-10-CM | POA: Diagnosis not present

## 2017-12-31 DIAGNOSIS — G8221 Paraplegia, complete: Secondary | ICD-10-CM | POA: Diagnosis not present

## 2017-12-31 DIAGNOSIS — I10 Essential (primary) hypertension: Secondary | ICD-10-CM | POA: Diagnosis not present

## 2017-12-31 DIAGNOSIS — L89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, stage 4: Secondary | ICD-10-CM | POA: Diagnosis not present

## 2017-12-31 DIAGNOSIS — F0390 Unspecified dementia without behavioral disturbance: Secondary | ICD-10-CM | POA: Diagnosis not present

## 2017-12-31 DIAGNOSIS — Z923 Personal history of irradiation: Secondary | ICD-10-CM | POA: Diagnosis not present

## 2018-01-14 DIAGNOSIS — M47816 Spondylosis without myelopathy or radiculopathy, lumbar region: Secondary | ICD-10-CM | POA: Diagnosis not present

## 2018-01-14 DIAGNOSIS — G894 Chronic pain syndrome: Secondary | ICD-10-CM | POA: Diagnosis not present

## 2018-01-14 DIAGNOSIS — F329 Major depressive disorder, single episode, unspecified: Secondary | ICD-10-CM | POA: Diagnosis not present

## 2018-01-14 DIAGNOSIS — M4712 Other spondylosis with myelopathy, cervical region: Secondary | ICD-10-CM | POA: Diagnosis not present

## 2018-01-26 DIAGNOSIS — N319 Neuromuscular dysfunction of bladder, unspecified: Secondary | ICD-10-CM | POA: Diagnosis not present

## 2018-01-26 DIAGNOSIS — Z466 Encounter for fitting and adjustment of urinary device: Secondary | ICD-10-CM | POA: Diagnosis not present

## 2018-01-28 ENCOUNTER — Encounter (HOSPITAL_BASED_OUTPATIENT_CLINIC_OR_DEPARTMENT_OTHER): Payer: PPO | Attending: Internal Medicine

## 2018-01-28 DIAGNOSIS — Z923 Personal history of irradiation: Secondary | ICD-10-CM | POA: Insufficient documentation

## 2018-01-28 DIAGNOSIS — G8221 Paraplegia, complete: Secondary | ICD-10-CM | POA: Insufficient documentation

## 2018-01-28 DIAGNOSIS — L89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, stage 4: Secondary | ICD-10-CM | POA: Insufficient documentation

## 2018-01-28 DIAGNOSIS — F0390 Unspecified dementia without behavioral disturbance: Secondary | ICD-10-CM | POA: Diagnosis not present

## 2018-01-28 DIAGNOSIS — E119 Type 2 diabetes mellitus without complications: Secondary | ICD-10-CM | POA: Insufficient documentation

## 2018-01-28 DIAGNOSIS — L97409 Non-pressure chronic ulcer of unspecified heel and midfoot with unspecified severity: Secondary | ICD-10-CM | POA: Diagnosis not present

## 2018-01-28 DIAGNOSIS — I10 Essential (primary) hypertension: Secondary | ICD-10-CM | POA: Diagnosis not present

## 2018-01-28 DIAGNOSIS — L89329 Pressure ulcer of left buttock, unspecified stage: Secondary | ICD-10-CM | POA: Diagnosis not present

## 2018-02-09 DIAGNOSIS — M4712 Other spondylosis with myelopathy, cervical region: Secondary | ICD-10-CM | POA: Diagnosis not present

## 2018-02-09 DIAGNOSIS — F329 Major depressive disorder, single episode, unspecified: Secondary | ICD-10-CM | POA: Diagnosis not present

## 2018-02-09 DIAGNOSIS — M47816 Spondylosis without myelopathy or radiculopathy, lumbar region: Secondary | ICD-10-CM | POA: Diagnosis not present

## 2018-02-09 DIAGNOSIS — G894 Chronic pain syndrome: Secondary | ICD-10-CM | POA: Diagnosis not present

## 2018-02-10 ENCOUNTER — Other Ambulatory Visit: Payer: Self-pay

## 2018-02-24 DIAGNOSIS — N319 Neuromuscular dysfunction of bladder, unspecified: Secondary | ICD-10-CM | POA: Diagnosis not present

## 2018-02-24 DIAGNOSIS — R339 Retention of urine, unspecified: Secondary | ICD-10-CM | POA: Diagnosis not present

## 2018-02-25 ENCOUNTER — Encounter (HOSPITAL_BASED_OUTPATIENT_CLINIC_OR_DEPARTMENT_OTHER): Payer: PPO | Attending: Internal Medicine

## 2018-02-25 DIAGNOSIS — L97822 Non-pressure chronic ulcer of other part of left lower leg with fat layer exposed: Secondary | ICD-10-CM | POA: Diagnosis not present

## 2018-02-25 DIAGNOSIS — E11622 Type 2 diabetes mellitus with other skin ulcer: Secondary | ICD-10-CM | POA: Insufficient documentation

## 2018-02-25 DIAGNOSIS — L89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, stage 4: Secondary | ICD-10-CM | POA: Diagnosis not present

## 2018-02-25 DIAGNOSIS — L98412 Non-pressure chronic ulcer of buttock with fat layer exposed: Secondary | ICD-10-CM | POA: Diagnosis not present

## 2018-02-25 DIAGNOSIS — G8221 Paraplegia, complete: Secondary | ICD-10-CM | POA: Diagnosis not present

## 2018-03-11 DIAGNOSIS — F329 Major depressive disorder, single episode, unspecified: Secondary | ICD-10-CM | POA: Diagnosis not present

## 2018-03-11 DIAGNOSIS — M4712 Other spondylosis with myelopathy, cervical region: Secondary | ICD-10-CM | POA: Diagnosis not present

## 2018-03-11 DIAGNOSIS — G894 Chronic pain syndrome: Secondary | ICD-10-CM | POA: Diagnosis not present

## 2018-03-11 DIAGNOSIS — M47816 Spondylosis without myelopathy or radiculopathy, lumbar region: Secondary | ICD-10-CM | POA: Diagnosis not present

## 2018-03-18 DIAGNOSIS — E782 Mixed hyperlipidemia: Secondary | ICD-10-CM | POA: Diagnosis not present

## 2018-03-18 DIAGNOSIS — E039 Hypothyroidism, unspecified: Secondary | ICD-10-CM | POA: Diagnosis not present

## 2018-03-18 DIAGNOSIS — I10 Essential (primary) hypertension: Secondary | ICD-10-CM | POA: Diagnosis not present

## 2018-03-18 DIAGNOSIS — E119 Type 2 diabetes mellitus without complications: Secondary | ICD-10-CM | POA: Diagnosis not present

## 2018-03-24 DIAGNOSIS — L97409 Non-pressure chronic ulcer of unspecified heel and midfoot with unspecified severity: Secondary | ICD-10-CM | POA: Diagnosis not present

## 2018-04-13 ENCOUNTER — Ambulatory Visit (HOSPITAL_BASED_OUTPATIENT_CLINIC_OR_DEPARTMENT_OTHER): Payer: PPO

## 2018-04-15 DIAGNOSIS — G894 Chronic pain syndrome: Secondary | ICD-10-CM | POA: Diagnosis not present

## 2018-04-15 DIAGNOSIS — F329 Major depressive disorder, single episode, unspecified: Secondary | ICD-10-CM | POA: Diagnosis not present

## 2018-04-15 DIAGNOSIS — M4712 Other spondylosis with myelopathy, cervical region: Secondary | ICD-10-CM | POA: Diagnosis not present

## 2018-04-15 DIAGNOSIS — M47816 Spondylosis without myelopathy or radiculopathy, lumbar region: Secondary | ICD-10-CM | POA: Diagnosis not present

## 2018-04-20 ENCOUNTER — Encounter (HOSPITAL_BASED_OUTPATIENT_CLINIC_OR_DEPARTMENT_OTHER): Payer: Self-pay

## 2018-04-20 ENCOUNTER — Encounter (HOSPITAL_BASED_OUTPATIENT_CLINIC_OR_DEPARTMENT_OTHER): Payer: PPO | Attending: Internal Medicine

## 2018-04-20 DIAGNOSIS — G8221 Paraplegia, complete: Secondary | ICD-10-CM | POA: Diagnosis not present

## 2018-04-20 DIAGNOSIS — L89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, stage 4: Secondary | ICD-10-CM | POA: Insufficient documentation

## 2018-04-20 DIAGNOSIS — L98419 Non-pressure chronic ulcer of buttock with unspecified severity: Secondary | ICD-10-CM | POA: Diagnosis not present

## 2018-04-20 DIAGNOSIS — Z923 Personal history of irradiation: Secondary | ICD-10-CM | POA: Diagnosis not present

## 2018-04-20 DIAGNOSIS — I10 Essential (primary) hypertension: Secondary | ICD-10-CM | POA: Insufficient documentation

## 2018-04-20 DIAGNOSIS — F0390 Unspecified dementia without behavioral disturbance: Secondary | ICD-10-CM | POA: Insufficient documentation

## 2018-04-20 DIAGNOSIS — E119 Type 2 diabetes mellitus without complications: Secondary | ICD-10-CM | POA: Diagnosis not present

## 2018-04-22 DIAGNOSIS — R338 Other retention of urine: Secondary | ICD-10-CM | POA: Diagnosis not present

## 2018-04-22 DIAGNOSIS — L97409 Non-pressure chronic ulcer of unspecified heel and midfoot with unspecified severity: Secondary | ICD-10-CM | POA: Diagnosis not present

## 2018-04-22 DIAGNOSIS — N318 Other neuromuscular dysfunction of bladder: Secondary | ICD-10-CM | POA: Diagnosis not present

## 2018-05-13 DIAGNOSIS — F329 Major depressive disorder, single episode, unspecified: Secondary | ICD-10-CM | POA: Diagnosis not present

## 2018-05-13 DIAGNOSIS — G894 Chronic pain syndrome: Secondary | ICD-10-CM | POA: Diagnosis not present

## 2018-05-13 DIAGNOSIS — M4712 Other spondylosis with myelopathy, cervical region: Secondary | ICD-10-CM | POA: Diagnosis not present

## 2018-05-13 DIAGNOSIS — M47816 Spondylosis without myelopathy or radiculopathy, lumbar region: Secondary | ICD-10-CM | POA: Diagnosis not present

## 2018-05-18 ENCOUNTER — Encounter (HOSPITAL_BASED_OUTPATIENT_CLINIC_OR_DEPARTMENT_OTHER): Payer: PPO | Attending: Internal Medicine

## 2018-05-26 DIAGNOSIS — R338 Other retention of urine: Secondary | ICD-10-CM | POA: Diagnosis not present

## 2018-06-10 DIAGNOSIS — M4712 Other spondylosis with myelopathy, cervical region: Secondary | ICD-10-CM | POA: Diagnosis not present

## 2018-06-10 DIAGNOSIS — Z79891 Long term (current) use of opiate analgesic: Secondary | ICD-10-CM | POA: Diagnosis not present

## 2018-06-10 DIAGNOSIS — G894 Chronic pain syndrome: Secondary | ICD-10-CM | POA: Diagnosis not present

## 2018-06-10 DIAGNOSIS — F329 Major depressive disorder, single episode, unspecified: Secondary | ICD-10-CM | POA: Diagnosis not present

## 2018-06-10 DIAGNOSIS — M47816 Spondylosis without myelopathy or radiculopathy, lumbar region: Secondary | ICD-10-CM | POA: Diagnosis not present

## 2018-06-14 IMAGING — CT CT ABD-PELV W/O CM
2 of 4 series · 16 of 46 positions shown, 18 images · non-contrast
Comparison: CT abdomen is dated 08/10/2015 and 11/01/2014.

CLINICAL DATA: Right flank pain, urinary tract infection. History
of stone, hypertension.

EXAM:
CT ABDOMEN AND PELVIS WITHOUT CONTRAST
TECHNIQUE: Multidetector CT imaging of the abdomen and pelvis was performed
following the standard protocol without IV contrast.

[Series 2: a/p w/o 5mm · axial · non-contrast · 0.98mm/px · z∈[+675,+1055]mm · 13 of 90 slices shown, 15 images]
[im 7/90  soft-tissue]
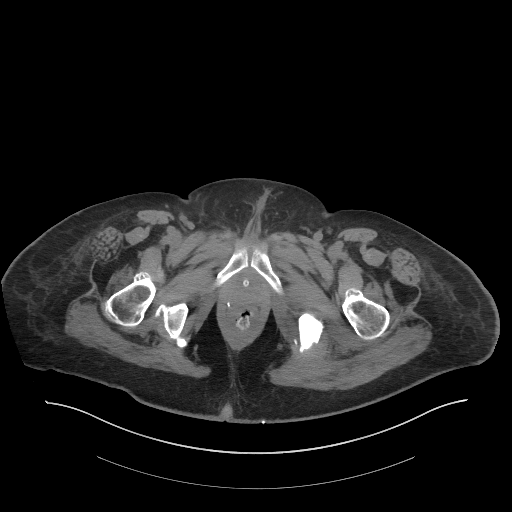
[im 7/90  bone]
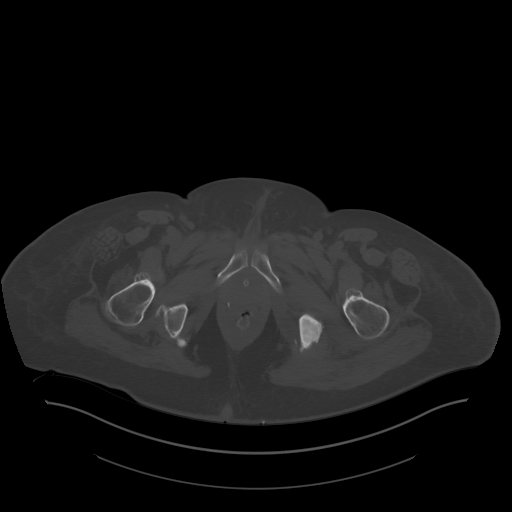
[im 13/90  soft-tissue]
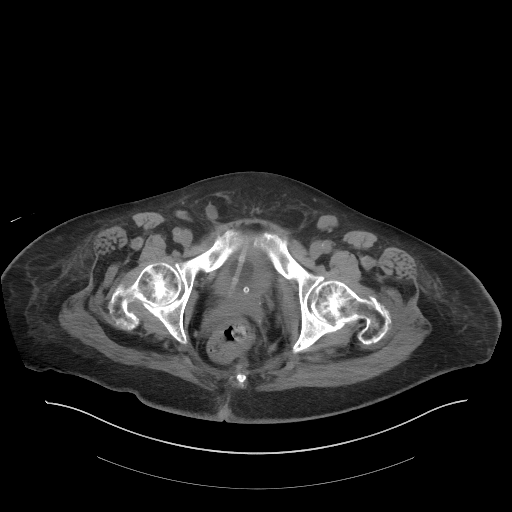
[im 20/90  soft-tissue]
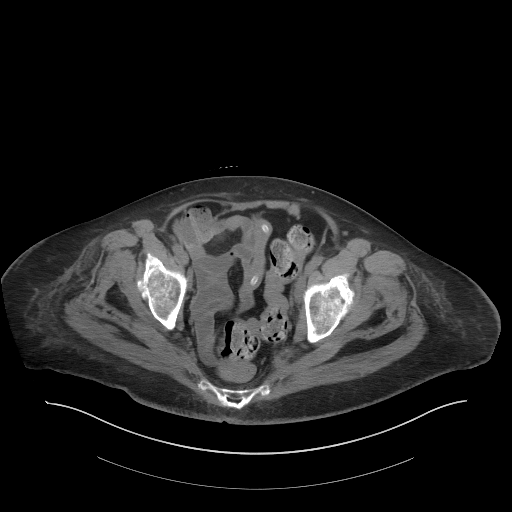
[im 26/90  soft-tissue]
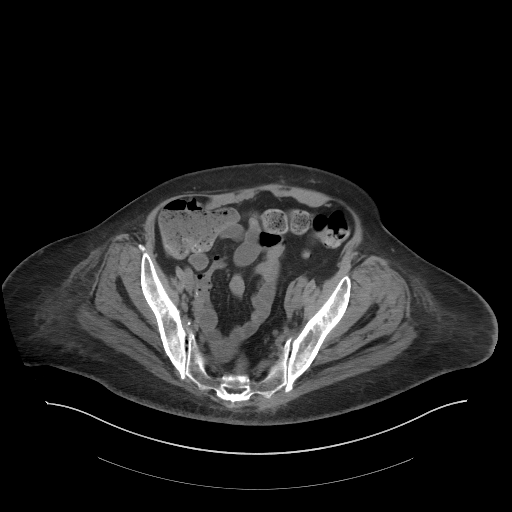
[im 32/90  soft-tissue]
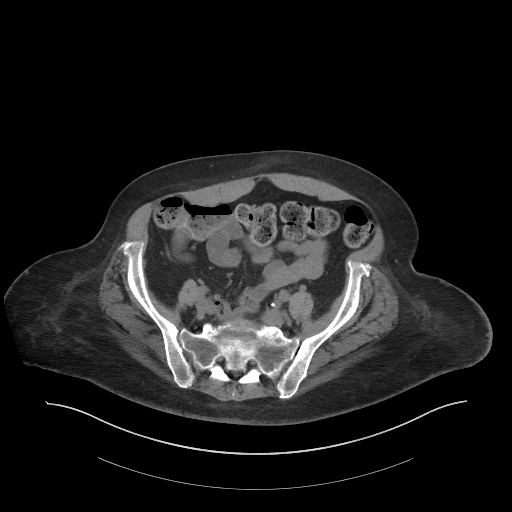
[im 39/90  soft-tissue]
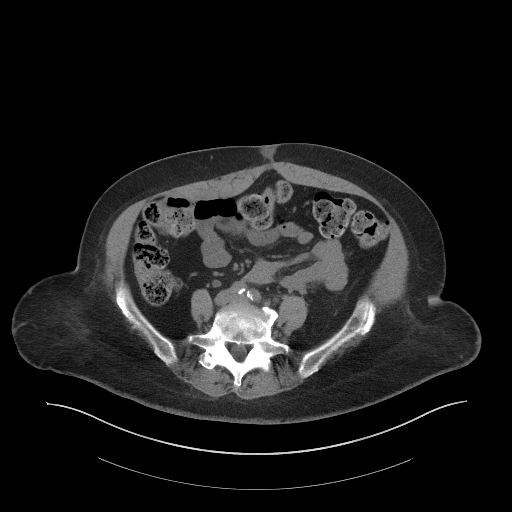
[im 45/90  soft-tissue]
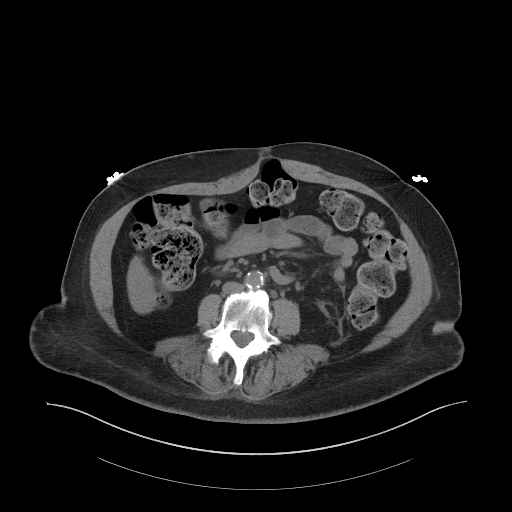
[im 51/90  soft-tissue]
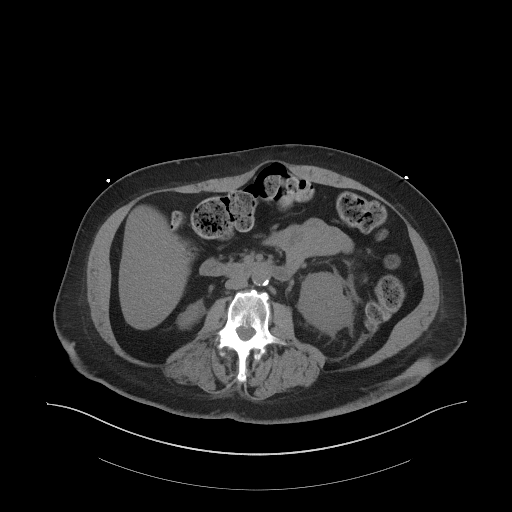
[im 58/90  soft-tissue]
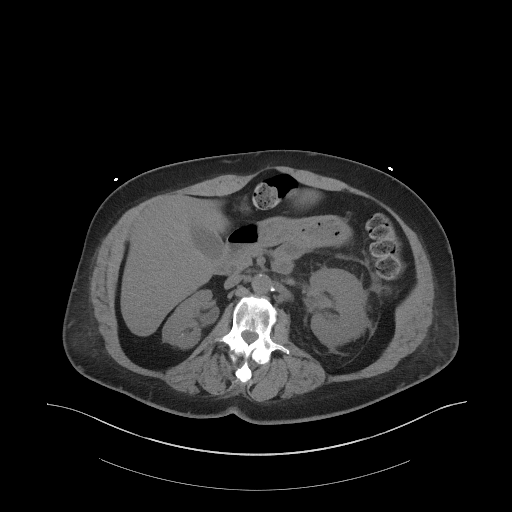
[im 58/90  bone]
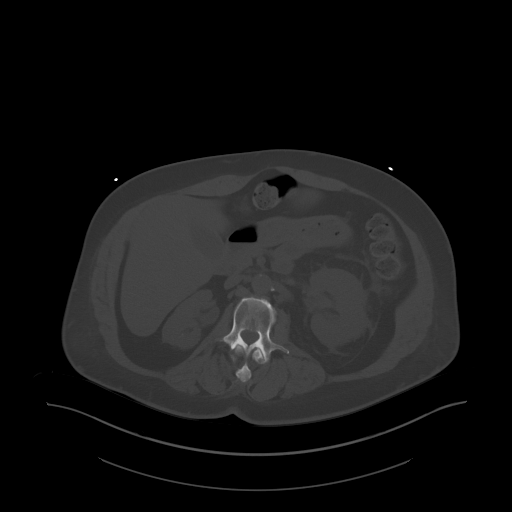
[im 64/90  soft-tissue]
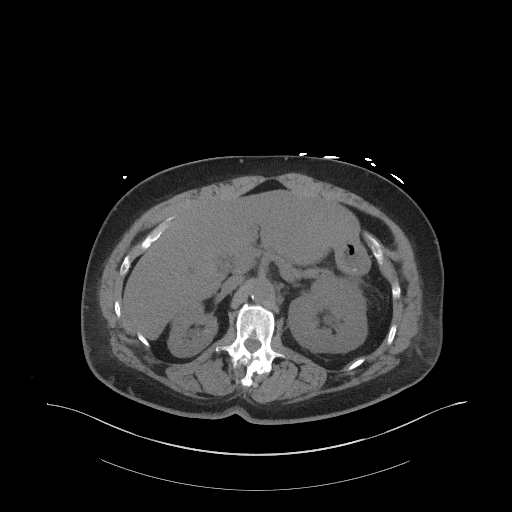
[im 70/90  soft-tissue]
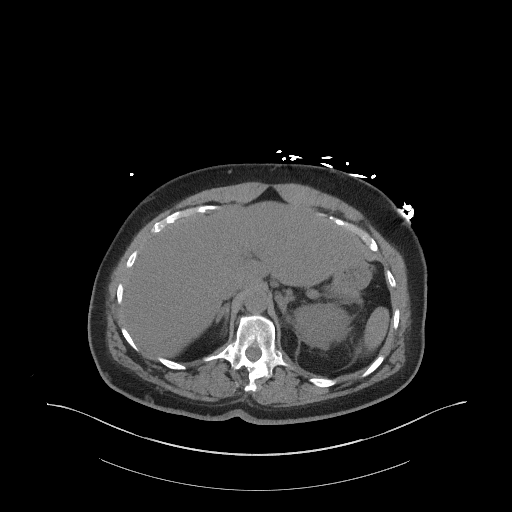
[im 77/90  soft-tissue]
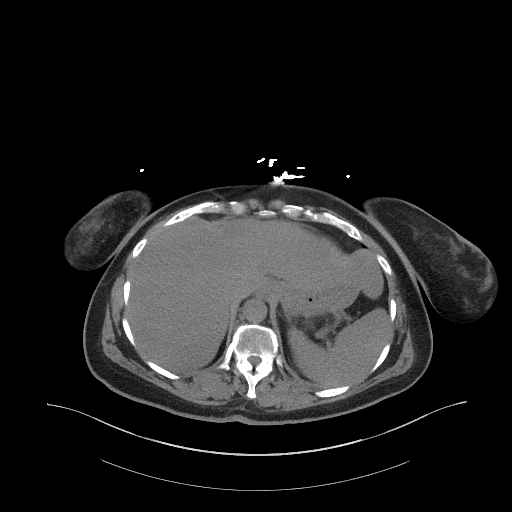
[im 83/90  soft-tissue]
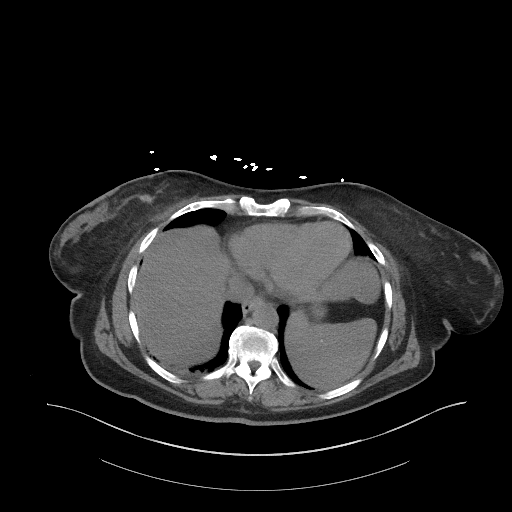

[Series 5: a/p w/o cor · coronal · non-contrast · 0.86mm/px · 3 of 126 slices shown]
[im 42/126  soft-tissue]
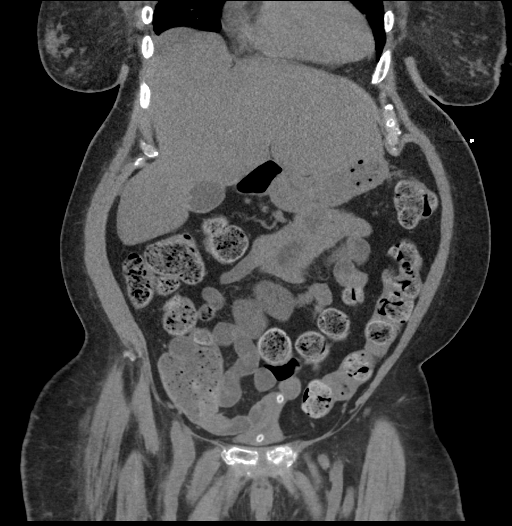
[im 56/126  soft-tissue]
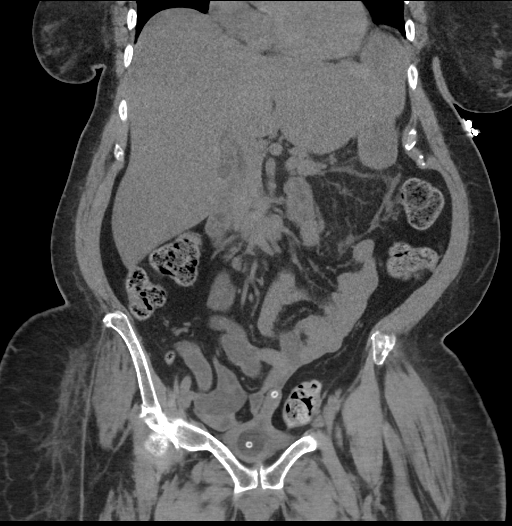
[im 70/126  soft-tissue]
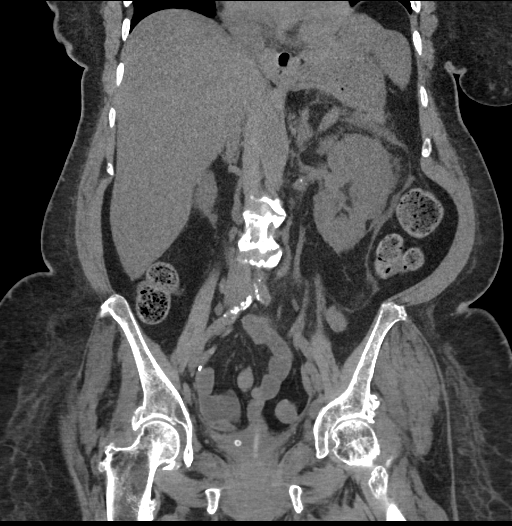

[16 of 46 positions shown; findings below may reference images not displayed]

FINDINGS: Lower chest:  No acute findings.

Hepatobiliary: Liver slightly low in density throughout suggesting
fatty infiltration. Small layering stones within the nondistended
gallbladder. No evidence of acute cholecystitis.

Pancreas: No mass or inflammatory process identified on this
un-enhanced exam.

Spleen: Within normal limits in size.

Adrenals/Urinary Tract: Mild left-sided hydronephrosis. Prominent
perinephric edema on the left which is new compared to the previous
study. 2 mm left renal stone. No obstructing renal or ureteral
stone.

Multiple stones again noted within the right renal pelvis, largest
measuring 9 mm, without associated hydronephrosis. No right-sided
ureteral stone.

Bladder is decompressed by a Foley catheter.

Stomach/Bowel: Bowel is normal in caliber. No bowel wall thickening
or evidence of bowel wall inflammation seen. Fairly large amount of
stool throughout the nondistended colon. Appendix is normal. Stomach
appears normal, decompressed.

Vascular/Lymphatic: Atherosclerotic changes of the normal caliber
abdominal aorta. No enlarged lymph nodes seen.

Reproductive: No mass or other significant abnormality.

Other: No abscess collection or free intraperitoneal air seen.

Musculoskeletal: New sclerotic lesion within the posterior aspect of
the left inferior pubic ramus, with adjacent soft tissue air,
incompletely imaged. Soft tissue air was identified in this area on
earlier CT, described as a probable decubitus ulcer on that report.
IMPRESSION: 1. Extensive perinephric edema about the left kidney, highly
suspicious for pyelonephritis given the history of UTI. Mild
left-sided hydronephrosis. No obstructing renal or ureteral stone.
Bladder decompressed by Foley catheter.
2. Bilateral nephrolithiasis.
3. New sclerosis of the posterior portion of the left inferior pubic
ramus with aggressive periosteal reaction, consistent with
osteomyelitis, with adjacent soft tissue gas compatible with
previously described decubitus ulcer.
4. Fatty infiltration of the liver.
5. Cholelithiasis without evidence of acute cholecystitis.
6. Aortic atherosclerosis.
These results were called by telephone at the time of interpretation
on 10/28/2015 at [DATE] to Dr. BENRABAH ETOIL , who verbally
acknowledged these results.

## 2018-06-25 DIAGNOSIS — R339 Retention of urine, unspecified: Secondary | ICD-10-CM | POA: Diagnosis not present

## 2018-07-15 DIAGNOSIS — I10 Essential (primary) hypertension: Secondary | ICD-10-CM | POA: Diagnosis not present

## 2018-07-15 DIAGNOSIS — R0602 Shortness of breath: Secondary | ICD-10-CM | POA: Diagnosis not present

## 2018-07-15 DIAGNOSIS — J301 Allergic rhinitis due to pollen: Secondary | ICD-10-CM | POA: Diagnosis not present

## 2018-07-15 DIAGNOSIS — I517 Cardiomegaly: Secondary | ICD-10-CM | POA: Diagnosis not present

## 2018-07-15 DIAGNOSIS — S7224XD Nondisplaced subtrochanteric fracture of right femur, subsequent encounter for closed fracture with routine healing: Secondary | ICD-10-CM | POA: Diagnosis not present

## 2018-07-15 DIAGNOSIS — H2513 Age-related nuclear cataract, bilateral: Secondary | ICD-10-CM | POA: Diagnosis not present

## 2018-07-15 DIAGNOSIS — H5203 Hypermetropia, bilateral: Secondary | ICD-10-CM | POA: Diagnosis not present

## 2018-07-15 DIAGNOSIS — H35341 Macular cyst, hole, or pseudohole, right eye: Secondary | ICD-10-CM | POA: Diagnosis not present

## 2018-07-15 DIAGNOSIS — I83813 Varicose veins of bilateral lower extremities with pain: Secondary | ICD-10-CM | POA: Diagnosis not present

## 2018-07-15 DIAGNOSIS — G894 Chronic pain syndrome: Secondary | ICD-10-CM | POA: Diagnosis not present

## 2018-07-15 DIAGNOSIS — I340 Nonrheumatic mitral (valve) insufficiency: Secondary | ICD-10-CM | POA: Diagnosis not present

## 2018-07-15 DIAGNOSIS — M4712 Other spondylosis with myelopathy, cervical region: Secondary | ICD-10-CM | POA: Diagnosis not present

## 2018-07-15 DIAGNOSIS — G4733 Obstructive sleep apnea (adult) (pediatric): Secondary | ICD-10-CM | POA: Diagnosis not present

## 2018-07-15 DIAGNOSIS — F329 Major depressive disorder, single episode, unspecified: Secondary | ICD-10-CM | POA: Diagnosis not present

## 2018-07-15 DIAGNOSIS — J449 Chronic obstructive pulmonary disease, unspecified: Secondary | ICD-10-CM | POA: Diagnosis not present

## 2018-07-15 DIAGNOSIS — H40013 Open angle with borderline findings, low risk, bilateral: Secondary | ICD-10-CM | POA: Diagnosis not present

## 2018-07-15 DIAGNOSIS — M47816 Spondylosis without myelopathy or radiculopathy, lumbar region: Secondary | ICD-10-CM | POA: Diagnosis not present

## 2018-07-15 DIAGNOSIS — F1020 Alcohol dependence, uncomplicated: Secondary | ICD-10-CM | POA: Diagnosis not present

## 2018-07-29 DIAGNOSIS — R339 Retention of urine, unspecified: Secondary | ICD-10-CM | POA: Diagnosis not present

## 2018-08-11 DIAGNOSIS — E039 Hypothyroidism, unspecified: Secondary | ICD-10-CM | POA: Diagnosis not present

## 2018-08-11 DIAGNOSIS — M4712 Other spondylosis with myelopathy, cervical region: Secondary | ICD-10-CM | POA: Diagnosis not present

## 2018-08-11 DIAGNOSIS — I10 Essential (primary) hypertension: Secondary | ICD-10-CM | POA: Diagnosis not present

## 2018-08-11 DIAGNOSIS — M47816 Spondylosis without myelopathy or radiculopathy, lumbar region: Secondary | ICD-10-CM | POA: Diagnosis not present

## 2018-08-11 DIAGNOSIS — G894 Chronic pain syndrome: Secondary | ICD-10-CM | POA: Diagnosis not present

## 2018-08-11 DIAGNOSIS — E782 Mixed hyperlipidemia: Secondary | ICD-10-CM | POA: Diagnosis not present

## 2018-08-11 DIAGNOSIS — E119 Type 2 diabetes mellitus without complications: Secondary | ICD-10-CM | POA: Diagnosis not present

## 2018-08-11 DIAGNOSIS — F329 Major depressive disorder, single episode, unspecified: Secondary | ICD-10-CM | POA: Diagnosis not present

## 2018-08-17 ENCOUNTER — Encounter (HOSPITAL_BASED_OUTPATIENT_CLINIC_OR_DEPARTMENT_OTHER): Payer: PPO | Attending: Internal Medicine

## 2018-08-17 DIAGNOSIS — Z923 Personal history of irradiation: Secondary | ICD-10-CM | POA: Diagnosis not present

## 2018-08-17 DIAGNOSIS — E119 Type 2 diabetes mellitus without complications: Secondary | ICD-10-CM | POA: Diagnosis not present

## 2018-08-17 DIAGNOSIS — Z09 Encounter for follow-up examination after completed treatment for conditions other than malignant neoplasm: Secondary | ICD-10-CM | POA: Insufficient documentation

## 2018-08-17 DIAGNOSIS — Z872 Personal history of diseases of the skin and subcutaneous tissue: Secondary | ICD-10-CM | POA: Insufficient documentation

## 2018-08-17 DIAGNOSIS — G8221 Paraplegia, complete: Secondary | ICD-10-CM | POA: Insufficient documentation

## 2018-08-17 DIAGNOSIS — I10 Essential (primary) hypertension: Secondary | ICD-10-CM | POA: Insufficient documentation

## 2018-08-17 DIAGNOSIS — L89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, stage 4: Secondary | ICD-10-CM | POA: Diagnosis not present

## 2018-08-17 DIAGNOSIS — L98419 Non-pressure chronic ulcer of buttock with unspecified severity: Secondary | ICD-10-CM | POA: Diagnosis not present

## 2018-08-31 DIAGNOSIS — R339 Retention of urine, unspecified: Secondary | ICD-10-CM | POA: Diagnosis not present

## 2018-09-09 DIAGNOSIS — G894 Chronic pain syndrome: Secondary | ICD-10-CM | POA: Diagnosis not present

## 2018-09-09 DIAGNOSIS — M4712 Other spondylosis with myelopathy, cervical region: Secondary | ICD-10-CM | POA: Diagnosis not present

## 2018-09-09 DIAGNOSIS — M47816 Spondylosis without myelopathy or radiculopathy, lumbar region: Secondary | ICD-10-CM | POA: Diagnosis not present

## 2018-09-09 DIAGNOSIS — F329 Major depressive disorder, single episode, unspecified: Secondary | ICD-10-CM | POA: Diagnosis not present

## 2018-09-15 ENCOUNTER — Inpatient Hospital Stay (HOSPITAL_COMMUNITY): Payer: PPO

## 2018-09-15 ENCOUNTER — Other Ambulatory Visit: Payer: Self-pay

## 2018-09-15 ENCOUNTER — Inpatient Hospital Stay (HOSPITAL_COMMUNITY)
Admission: EM | Admit: 2018-09-15 | Discharge: 2018-09-23 | DRG: 698 | Disposition: A | Discharge status: Home / Self Care | Payer: PPO | Attending: Internal Medicine | Admitting: Internal Medicine

## 2018-09-15 ENCOUNTER — Emergency Department (HOSPITAL_COMMUNITY): Payer: PPO

## 2018-09-15 DIAGNOSIS — Z87442 Personal history of urinary calculi: Secondary | ICD-10-CM

## 2018-09-15 DIAGNOSIS — M4628 Osteomyelitis of vertebra, sacral and sacrococcygeal region: Secondary | ICD-10-CM | POA: Diagnosis present

## 2018-09-15 DIAGNOSIS — R4182 Altered mental status, unspecified: Secondary | ICD-10-CM | POA: Diagnosis not present

## 2018-09-15 DIAGNOSIS — Z79890 Hormone replacement therapy (postmenopausal): Secondary | ICD-10-CM

## 2018-09-15 DIAGNOSIS — N319 Neuromuscular dysfunction of bladder, unspecified: Secondary | ICD-10-CM | POA: Diagnosis present

## 2018-09-15 DIAGNOSIS — Z7401 Bed confinement status: Secondary | ICD-10-CM

## 2018-09-15 DIAGNOSIS — N390 Urinary tract infection, site not specified: Secondary | ICD-10-CM | POA: Diagnosis not present

## 2018-09-15 DIAGNOSIS — J9601 Acute respiratory failure with hypoxia: Secondary | ICD-10-CM

## 2018-09-15 DIAGNOSIS — B961 Klebsiella pneumoniae [K. pneumoniae] as the cause of diseases classified elsewhere: Secondary | ICD-10-CM

## 2018-09-15 DIAGNOSIS — I110 Hypertensive heart disease with heart failure: Secondary | ICD-10-CM | POA: Diagnosis not present

## 2018-09-15 DIAGNOSIS — R0902 Hypoxemia: Secondary | ICD-10-CM | POA: Diagnosis not present

## 2018-09-15 DIAGNOSIS — Z20828 Contact with and (suspected) exposure to other viral communicable diseases: Secondary | ICD-10-CM | POA: Diagnosis not present

## 2018-09-15 DIAGNOSIS — M4712 Other spondylosis with myelopathy, cervical region: Secondary | ICD-10-CM

## 2018-09-15 DIAGNOSIS — T40605A Adverse effect of unspecified narcotics, initial encounter: Secondary | ICD-10-CM | POA: Diagnosis present

## 2018-09-15 DIAGNOSIS — Z90710 Acquired absence of both cervix and uterus: Secondary | ICD-10-CM

## 2018-09-15 DIAGNOSIS — J9811 Atelectasis: Secondary | ICD-10-CM

## 2018-09-15 DIAGNOSIS — E1165 Type 2 diabetes mellitus with hyperglycemia: Secondary | ICD-10-CM | POA: Insufficient documentation

## 2018-09-15 DIAGNOSIS — I5033 Acute on chronic diastolic (congestive) heart failure: Secondary | ICD-10-CM | POA: Diagnosis not present

## 2018-09-15 DIAGNOSIS — G8929 Other chronic pain: Secondary | ICD-10-CM | POA: Diagnosis present

## 2018-09-15 DIAGNOSIS — E874 Mixed disorder of acid-base balance: Secondary | ICD-10-CM | POA: Diagnosis present

## 2018-09-15 DIAGNOSIS — L89152 Pressure ulcer of sacral region, stage 2: Secondary | ICD-10-CM | POA: Diagnosis not present

## 2018-09-15 DIAGNOSIS — E669 Obesity, unspecified: Secondary | ICD-10-CM | POA: Diagnosis present

## 2018-09-15 DIAGNOSIS — Z886 Allergy status to analgesic agent status: Secondary | ICD-10-CM | POA: Diagnosis not present

## 2018-09-15 DIAGNOSIS — A419 Sepsis, unspecified organism: Secondary | ICD-10-CM | POA: Diagnosis not present

## 2018-09-15 DIAGNOSIS — R000 Tachycardia, unspecified: Secondary | ICD-10-CM | POA: Diagnosis not present

## 2018-09-15 DIAGNOSIS — E119 Type 2 diabetes mellitus without complications: Secondary | ICD-10-CM | POA: Insufficient documentation

## 2018-09-15 DIAGNOSIS — E1169 Type 2 diabetes mellitus with other specified complication: Secondary | ICD-10-CM | POA: Diagnosis present

## 2018-09-15 DIAGNOSIS — G894 Chronic pain syndrome: Secondary | ICD-10-CM

## 2018-09-15 DIAGNOSIS — A4159 Other Gram-negative sepsis: Secondary | ICD-10-CM | POA: Diagnosis present

## 2018-09-15 DIAGNOSIS — Z853 Personal history of malignant neoplasm of breast: Secondary | ICD-10-CM

## 2018-09-15 DIAGNOSIS — G8220 Paraplegia, unspecified: Secondary | ICD-10-CM | POA: Diagnosis present

## 2018-09-15 DIAGNOSIS — I959 Hypotension, unspecified: Secondary | ICD-10-CM | POA: Diagnosis not present

## 2018-09-15 DIAGNOSIS — Z79899 Other long term (current) drug therapy: Secondary | ICD-10-CM

## 2018-09-15 DIAGNOSIS — R7881 Bacteremia: Secondary | ICD-10-CM | POA: Diagnosis not present

## 2018-09-15 DIAGNOSIS — J939 Pneumothorax, unspecified: Secondary | ICD-10-CM | POA: Diagnosis not present

## 2018-09-15 DIAGNOSIS — I5030 Unspecified diastolic (congestive) heart failure: Secondary | ICD-10-CM | POA: Diagnosis not present

## 2018-09-15 DIAGNOSIS — A4151 Sepsis due to Escherichia coli [E. coli]: Secondary | ICD-10-CM | POA: Diagnosis present

## 2018-09-15 DIAGNOSIS — N179 Acute kidney failure, unspecified: Secondary | ICD-10-CM | POA: Diagnosis present

## 2018-09-15 DIAGNOSIS — L89154 Pressure ulcer of sacral region, stage 4: Secondary | ICD-10-CM

## 2018-09-15 DIAGNOSIS — R918 Other nonspecific abnormal finding of lung field: Secondary | ICD-10-CM | POA: Diagnosis not present

## 2018-09-15 DIAGNOSIS — E039 Hypothyroidism, unspecified: Secondary | ICD-10-CM | POA: Diagnosis not present

## 2018-09-15 DIAGNOSIS — Z87891 Personal history of nicotine dependence: Secondary | ICD-10-CM

## 2018-09-15 DIAGNOSIS — M8668 Other chronic osteomyelitis, other site: Secondary | ICD-10-CM | POA: Diagnosis not present

## 2018-09-15 DIAGNOSIS — Z8249 Family history of ischemic heart disease and other diseases of the circulatory system: Secondary | ICD-10-CM

## 2018-09-15 DIAGNOSIS — Z7984 Long term (current) use of oral hypoglycemic drugs: Secondary | ICD-10-CM

## 2018-09-15 DIAGNOSIS — J811 Chronic pulmonary edema: Secondary | ICD-10-CM

## 2018-09-15 DIAGNOSIS — K760 Fatty (change of) liver, not elsewhere classified: Secondary | ICD-10-CM | POA: Diagnosis not present

## 2018-09-15 DIAGNOSIS — T83510A Infection and inflammatory reaction due to cystostomy catheter, initial encounter: Principal | ICD-10-CM | POA: Diagnosis present

## 2018-09-15 DIAGNOSIS — Z7189 Other specified counseling: Secondary | ICD-10-CM

## 2018-09-15 DIAGNOSIS — E7800 Pure hypercholesterolemia, unspecified: Secondary | ICD-10-CM | POA: Diagnosis present

## 2018-09-15 DIAGNOSIS — N200 Calculus of kidney: Secondary | ICD-10-CM | POA: Diagnosis not present

## 2018-09-15 DIAGNOSIS — IMO0002 Reserved for concepts with insufficient information to code with codable children: Secondary | ICD-10-CM | POA: Insufficient documentation

## 2018-09-15 DIAGNOSIS — Z6832 Body mass index (BMI) 32.0-32.9, adult: Secondary | ICD-10-CM

## 2018-09-15 DIAGNOSIS — L89322 Pressure ulcer of left buttock, stage 2: Secondary | ICD-10-CM | POA: Diagnosis not present

## 2018-09-15 DIAGNOSIS — Z515 Encounter for palliative care: Secondary | ICD-10-CM | POA: Diagnosis not present

## 2018-09-15 DIAGNOSIS — Z960 Presence of urogenital implants: Secondary | ICD-10-CM | POA: Diagnosis not present

## 2018-09-15 DIAGNOSIS — L89209 Pressure ulcer of unspecified hip, unspecified stage: Secondary | ICD-10-CM | POA: Diagnosis not present

## 2018-09-15 DIAGNOSIS — J810 Acute pulmonary edema: Secondary | ICD-10-CM | POA: Diagnosis not present

## 2018-09-15 DIAGNOSIS — K5900 Constipation, unspecified: Secondary | ICD-10-CM | POA: Diagnosis not present

## 2018-09-15 DIAGNOSIS — K219 Gastro-esophageal reflux disease without esophagitis: Secondary | ICD-10-CM | POA: Diagnosis present

## 2018-09-15 DIAGNOSIS — R0603 Acute respiratory distress: Secondary | ICD-10-CM

## 2018-09-15 DIAGNOSIS — K5641 Fecal impaction: Secondary | ICD-10-CM | POA: Diagnosis not present

## 2018-09-15 DIAGNOSIS — G9340 Encephalopathy, unspecified: Secondary | ICD-10-CM

## 2018-09-15 DIAGNOSIS — Y846 Urinary catheterization as the cause of abnormal reaction of the patient, or of later complication, without mention of misadventure at the time of the procedure: Secondary | ICD-10-CM | POA: Diagnosis present

## 2018-09-15 DIAGNOSIS — E785 Hyperlipidemia, unspecified: Secondary | ICD-10-CM | POA: Diagnosis not present

## 2018-09-15 DIAGNOSIS — Z881 Allergy status to other antibiotic agents status: Secondary | ICD-10-CM | POA: Diagnosis not present

## 2018-09-15 DIAGNOSIS — R6520 Severe sepsis without septic shock: Secondary | ICD-10-CM | POA: Diagnosis not present

## 2018-09-15 DIAGNOSIS — R5383 Other fatigue: Secondary | ICD-10-CM

## 2018-09-15 DIAGNOSIS — L89329 Pressure ulcer of left buttock, unspecified stage: Secondary | ICD-10-CM | POA: Insufficient documentation

## 2018-09-15 DIAGNOSIS — R0602 Shortness of breath: Secondary | ICD-10-CM | POA: Diagnosis not present

## 2018-09-15 DIAGNOSIS — L89159 Pressure ulcer of sacral region, unspecified stage: Secondary | ICD-10-CM | POA: Diagnosis not present

## 2018-09-15 DIAGNOSIS — K5903 Drug induced constipation: Secondary | ICD-10-CM

## 2018-09-15 DIAGNOSIS — E1142 Type 2 diabetes mellitus with diabetic polyneuropathy: Secondary | ICD-10-CM | POA: Diagnosis not present

## 2018-09-15 DIAGNOSIS — D649 Anemia, unspecified: Secondary | ICD-10-CM | POA: Diagnosis present

## 2018-09-15 DIAGNOSIS — J9602 Acute respiratory failure with hypercapnia: Secondary | ICD-10-CM | POA: Diagnosis not present

## 2018-09-15 DIAGNOSIS — I5031 Acute diastolic (congestive) heart failure: Secondary | ICD-10-CM | POA: Diagnosis not present

## 2018-09-15 DIAGNOSIS — F329 Major depressive disorder, single episode, unspecified: Secondary | ICD-10-CM | POA: Diagnosis present

## 2018-09-15 DIAGNOSIS — Z96652 Presence of left artificial knee joint: Secondary | ICD-10-CM | POA: Diagnosis present

## 2018-09-15 DIAGNOSIS — K567 Ileus, unspecified: Secondary | ICD-10-CM

## 2018-09-15 DIAGNOSIS — I160 Hypertensive urgency: Secondary | ICD-10-CM | POA: Diagnosis not present

## 2018-09-15 DIAGNOSIS — I10 Essential (primary) hypertension: Secondary | ICD-10-CM | POA: Diagnosis not present

## 2018-09-15 DIAGNOSIS — Z981 Arthrodesis status: Secondary | ICD-10-CM | POA: Diagnosis not present

## 2018-09-15 HISTORY — DX: Polyneuropathy, unspecified: G62.9

## 2018-09-15 HISTORY — DX: Neuromuscular dysfunction of bladder, unspecified: N31.9

## 2018-09-15 HISTORY — DX: Osteomyelitis, unspecified: M86.9

## 2018-09-15 HISTORY — DX: Sepsis, unspecified organism: A41.9

## 2018-09-15 LAB — COMPREHENSIVE METABOLIC PANEL
ALT: 44 U/L (ref 0–44)
AST: 51 U/L — ABNORMAL HIGH (ref 15–41)
Albumin: 3.3 g/dL — ABNORMAL LOW (ref 3.5–5.0)
Alkaline Phosphatase: 59 U/L (ref 38–126)
Anion gap: 14 (ref 5–15)
BUN: 30 mg/dL — ABNORMAL HIGH (ref 8–23)
CO2: 23 mmol/L (ref 22–32)
Calcium: 9.1 mg/dL (ref 8.9–10.3)
Chloride: 100 mmol/L (ref 98–111)
Creatinine, Ser: 1.44 mg/dL — ABNORMAL HIGH (ref 0.44–1.00)
GFR calc Af Amer: 43 mL/min — ABNORMAL LOW (ref >60)
GFR calc non Af Amer: 37 mL/min — ABNORMAL LOW (ref >60)
Glucose, Bld: 270 mg/dL — ABNORMAL HIGH (ref 70–99)
Potassium: 4.8 mmol/L (ref 3.5–5.1)
Sodium: 137 mmol/L (ref 135–145)
Total Bilirubin: 0.9 mg/dL (ref 0.3–1.2)
Total Protein: 7.3 g/dL (ref 6.5–8.1)

## 2018-09-15 LAB — CBC WITH DIFFERENTIAL/PLATELET
Abs Immature Granulocytes: 0.15 10*3/uL — ABNORMAL HIGH (ref 0.00–0.07)
Basophils Absolute: 0 10*3/uL (ref 0.0–0.1)
Basophils Relative: 0 %
Eosinophils Absolute: 0 10*3/uL (ref 0.0–0.5)
Eosinophils Relative: 0 %
HCT: 44.1 % (ref 36.0–46.0)
Hemoglobin: 13.9 g/dL (ref 12.0–15.0)
Immature Granulocytes: 1 %
Lymphocytes Relative: 3 %
Lymphs Abs: 0.4 10*3/uL — ABNORMAL LOW (ref 0.7–4.0)
MCH: 30.5 pg (ref 26.0–34.0)
MCHC: 31.5 g/dL (ref 30.0–36.0)
MCV: 96.7 fL (ref 80.0–100.0)
Monocytes Absolute: 0.6 10*3/uL (ref 0.1–1.0)
Monocytes Relative: 4 %
Neutro Abs: 15.6 10*3/uL — ABNORMAL HIGH (ref 1.7–7.7)
Neutrophils Relative %: 92 %
Platelets: 192 10*3/uL (ref 150–400)
RBC: 4.56 MIL/uL (ref 3.87–5.11)
RDW: 13.4 % (ref 11.5–15.5)
WBC: 16.8 10*3/uL — ABNORMAL HIGH (ref 4.0–10.5)
nRBC: 0 % (ref 0.0–0.2)

## 2018-09-15 LAB — POCT I-STAT EG7
Acid-Base Excess: 1 mmol/L (ref 0.0–2.0)
Bicarbonate: 24.7 mmol/L (ref 20.0–28.0)
Calcium, Ion: 1.07 mmol/L — ABNORMAL LOW (ref 1.15–1.40)
HCT: 40 % (ref 36.0–46.0)
Hemoglobin: 13.6 g/dL (ref 12.0–15.0)
O2 Saturation: 98 %
Potassium: 4.8 mmol/L (ref 3.5–5.1)
Sodium: 138 mmol/L (ref 135–145)
TCO2: 26 mmol/L (ref 22–32)
pCO2, Ven: 36.9 mmHg — ABNORMAL LOW (ref 44.0–60.0)
pH, Ven: 7.434 — ABNORMAL HIGH (ref 7.250–7.430)
pO2, Ven: 100 mmHg — ABNORMAL HIGH (ref 32.0–45.0)

## 2018-09-15 LAB — POCT I-STAT 7, (LYTES, BLD GAS, ICA,H+H)
Acid-base deficit: 1 mmol/L (ref 0.0–2.0)
Bicarbonate: 26.7 mmol/L (ref 20.0–28.0)
Calcium, Ion: 1.25 mmol/L (ref 1.15–1.40)
HCT: 36 % (ref 36.0–46.0)
Hemoglobin: 12.2 g/dL (ref 12.0–15.0)
O2 Saturation: 94 %
Patient temperature: 99.6
Potassium: 4.6 mmol/L (ref 3.5–5.1)
Sodium: 140 mmol/L (ref 135–145)
TCO2: 28 mmol/L (ref 22–32)
pCO2 arterial: 57 mmHg — ABNORMAL HIGH (ref 32.0–48.0)
pH, Arterial: 7.281 — ABNORMAL LOW (ref 7.350–7.450)
pO2, Arterial: 84 mmHg (ref 83.0–108.0)

## 2018-09-15 LAB — LACTIC ACID, PLASMA
Lactic Acid, Venous: 5.5 mmol/L (ref 0.5–1.9)
Lactic Acid, Venous: 2.9 mmol/L (ref 0.5–1.9)
Lactic Acid, Venous: 2.1 mmol/L (ref 0.5–1.9)

## 2018-09-15 LAB — CBC
HCT: 39.6 % (ref 36.0–46.0)
Hemoglobin: 12.5 g/dL (ref 12.0–15.0)
MCH: 30.9 pg (ref 26.0–34.0)
MCHC: 31.6 g/dL (ref 30.0–36.0)
MCV: 98 fL (ref 80.0–100.0)
Platelets: 156 10*3/uL (ref 150–400)
RBC: 4.04 MIL/uL (ref 3.87–5.11)
RDW: 13.5 % (ref 11.5–15.5)
WBC: 11.4 10*3/uL — ABNORMAL HIGH (ref 4.0–10.5)
nRBC: 0 % (ref 0.0–0.2)

## 2018-09-15 LAB — CREATININE, SERUM
Creatinine, Ser: 1.23 mg/dL — ABNORMAL HIGH (ref 0.44–1.00)
GFR calc Af Amer: 51 mL/min — ABNORMAL LOW (ref >60)
GFR calc non Af Amer: 44 mL/min — ABNORMAL LOW (ref >60)

## 2018-09-15 LAB — URINALYSIS, ROUTINE W REFLEX MICROSCOPIC
Bilirubin Urine: NEGATIVE
Glucose, UA: NEGATIVE mg/dL
Ketones, ur: 5 mg/dL — AB
Nitrite: NEGATIVE
Protein, ur: 100 mg/dL — AB
Specific Gravity, Urine: 1.019 (ref 1.005–1.030)
WBC, UA: >50 WBC/hpf — ABNORMAL HIGH (ref 0–5)
pH: 5 (ref 5.0–8.0)

## 2018-09-15 LAB — PHOSPHORUS: Phosphorus: 2.6 mg/dL (ref 2.5–4.6)

## 2018-09-15 LAB — BRAIN NATRIURETIC PEPTIDE: B Natriuretic Peptide: 76.7 pg/mL (ref 0.0–100.0)

## 2018-09-15 LAB — GLUCOSE, CAPILLARY: Glucose-Capillary: 166 mg/dL — ABNORMAL HIGH (ref 70–99)

## 2018-09-15 LAB — SARS CORONAVIRUS 2 BY RT PCR (HOSPITAL ORDER, PERFORMED IN ~~LOC~~ HOSPITAL LAB): SARS Coronavirus 2: NEGATIVE

## 2018-09-15 LAB — HEMOGLOBIN A1C
Hgb A1c MFr Bld: 10 % — ABNORMAL HIGH (ref 4.8–5.6)
Mean Plasma Glucose: 240.3 mg/dL

## 2018-09-15 LAB — TSH: TSH: 1.394 u[IU]/mL (ref 0.350–4.500)

## 2018-09-15 LAB — TROPONIN I: Troponin I: <0.03 ng/mL (ref <0.03)

## 2018-09-15 LAB — MAGNESIUM: Magnesium: 1.4 mg/dL — ABNORMAL LOW (ref 1.7–2.4)

## 2018-09-15 MED ORDER — OXYCODONE-ACETAMINOPHEN 5-325 MG PO TABS
1.0000 | ORAL_TABLET | Freq: Four times a day (QID) | ORAL | Status: DC | PRN
  Administered 2018-09-16 (×3): 1 via ORAL
  Filled 2018-09-15 (×4): qty 1

## 2018-09-15 MED ORDER — FERROUS SULFATE 325 (65 FE) MG PO TABS
325.0000 mg | ORAL_TABLET | Freq: Every day | ORAL | Status: DC
  Administered 2018-09-15 – 2018-09-23 (×8): 325 mg via ORAL
  Filled 2018-09-15 (×8): qty 1

## 2018-09-15 MED ORDER — OXYCODONE HCL 5 MG PO TABS
5.0000 mg | ORAL_TABLET | Freq: Four times a day (QID) | ORAL | Status: DC | PRN
  Administered 2018-09-16 (×3): 5 mg via ORAL
  Filled 2018-09-15 (×3): qty 1

## 2018-09-15 MED ORDER — NOREPINEPHRINE 4 MG/250ML-% IV SOLN: 0.0000 ug/min | INTRAVENOUS | Status: DC

## 2018-09-15 MED ORDER — HEPARIN SODIUM (PORCINE) 5000 UNIT/ML IJ SOLN
5000.0000 [IU] | Freq: Three times a day (TID) | INTRAMUSCULAR | Status: DC
  Administered 2018-09-16 – 2018-09-23 (×24): 5000 [IU] via SUBCUTANEOUS
  Filled 2018-09-15 (×24): qty 1

## 2018-09-15 MED ORDER — FOLIC ACID 1 MG PO TABS
1.0000 mg | ORAL_TABLET | Freq: Every day | ORAL | Status: DC
  Administered 2018-09-16 – 2018-09-23 (×8): 1 mg via ORAL
  Filled 2018-09-15 (×9): qty 1

## 2018-09-15 MED ORDER — SODIUM CHLORIDE 0.9 % IV BOLUS
1000.0000 mL | Freq: Once | INTRAVENOUS | Status: AC
  Administered 2018-09-15: 1000 mL via INTRAVENOUS

## 2018-09-15 MED ORDER — VANCOMYCIN HCL IN DEXTROSE 1-5 GM/200ML-% IV SOLN: 1000.0000 mg | Freq: Once | INTRAVENOUS | Status: DC

## 2018-09-15 MED ORDER — ADULT MULTIVITAMIN W/MINERALS CH
1.0000 | ORAL_TABLET | Freq: Every day | ORAL | Status: DC
  Administered 2018-09-16 – 2018-09-23 (×7): 1 via ORAL
  Filled 2018-09-15 (×9): qty 1

## 2018-09-15 MED ORDER — DULOXETINE HCL 60 MG PO CPEP
120.0000 mg | ORAL_CAPSULE | Freq: Every day | ORAL | Status: DC
  Administered 2018-09-16 – 2018-09-23 (×8): 120 mg via ORAL
  Filled 2018-09-15 (×9): qty 2

## 2018-09-15 MED ORDER — VITAMIN C 500 MG PO TABS
500.0000 mg | ORAL_TABLET | Freq: Every day | ORAL | Status: DC
  Administered 2018-09-16 – 2018-09-23 (×7): 500 mg via ORAL
  Filled 2018-09-15 (×7): qty 1

## 2018-09-15 MED ORDER — ZINC SULFATE 220 (50 ZN) MG PO CAPS
220.0000 mg | ORAL_CAPSULE | Freq: Every day | ORAL | Status: DC
  Administered 2018-09-16 – 2018-09-23 (×7): 220 mg via ORAL
  Filled 2018-09-15 (×8): qty 1

## 2018-09-15 MED ORDER — MORPHINE SULFATE ER 15 MG PO TBCR
30.0000 mg | EXTENDED_RELEASE_TABLET | Freq: Two times a day (BID) | ORAL | Status: DC
  Administered 2018-09-16 – 2018-09-17 (×3): 30 mg via ORAL
  Filled 2018-09-15 (×3): qty 2

## 2018-09-15 MED ORDER — SODIUM CHLORIDE 0.9 % IV BOLUS: 1000.0000 mL | Freq: Once | INTRAVENOUS | Status: DC

## 2018-09-15 MED ORDER — SODIUM CHLORIDE 0.9 % IV SOLN
2.0000 g | Freq: Two times a day (BID) | INTRAVENOUS | Status: DC
  Administered 2018-09-16 (×2): 2 g via INTRAVENOUS
  Filled 2018-09-15 (×2): qty 2

## 2018-09-15 MED ORDER — OXYCODONE-ACETAMINOPHEN 10-325 MG PO TABS: 1.0000 | ORAL_TABLET | Freq: Four times a day (QID) | ORAL | Status: DC | PRN

## 2018-09-15 MED ORDER — FUROSEMIDE 40 MG PO TABS
40.0000 mg | ORAL_TABLET | Freq: Every day | ORAL | Status: DC
  Administered 2018-09-15 – 2018-09-16 (×2): 40 mg via ORAL
  Filled 2018-09-15 (×2): qty 1

## 2018-09-15 MED ORDER — METRONIDAZOLE IN NACL 5-0.79 MG/ML-% IV SOLN
500.0000 mg | Freq: Once | INTRAVENOUS | Status: AC
  Administered 2018-09-15: 500 mg via INTRAVENOUS
  Filled 2018-09-15: qty 100

## 2018-09-15 MED ORDER — NIFEDIPINE ER OSMOTIC RELEASE 30 MG PO TB24
30.0000 mg | ORAL_TABLET | Freq: Every day | ORAL | Status: DC
  Administered 2018-09-15 – 2018-09-23 (×9): 30 mg via ORAL
  Filled 2018-09-15 (×9): qty 1

## 2018-09-15 MED ORDER — LEVOTHYROXINE SODIUM 25 MCG PO TABS
125.0000 ug | ORAL_TABLET | Freq: Every day | ORAL | Status: DC
  Administered 2018-09-16 – 2018-09-23 (×7): 125 ug via ORAL
  Filled 2018-09-15 (×8): qty 1

## 2018-09-15 MED ORDER — SENNA 8.6 MG PO TABS
2.0000 | ORAL_TABLET | Freq: Every day | ORAL | Status: DC
  Administered 2018-09-15 – 2018-09-21 (×5): 17.2 mg via ORAL
  Filled 2018-09-15 (×5): qty 2

## 2018-09-15 MED ORDER — VANCOMYCIN HCL 10 G IV SOLR
1500.0000 mg | Freq: Once | INTRAVENOUS | Status: AC
  Administered 2018-09-15: 1500 mg via INTRAVENOUS
  Filled 2018-09-15: qty 1500

## 2018-09-15 MED ORDER — METOPROLOL TARTRATE 100 MG PO TABS
100.0000 mg | ORAL_TABLET | Freq: Two times a day (BID) | ORAL | Status: DC
  Administered 2018-09-15 – 2018-09-23 (×16): 100 mg via ORAL
  Filled 2018-09-15 (×16): qty 1

## 2018-09-15 MED ORDER — VANCOMYCIN HCL 10 G IV SOLR: 1250.0000 mg | INTRAVENOUS | Status: DC

## 2018-09-15 MED ORDER — PREGABALIN 75 MG PO CAPS
75.0000 mg | ORAL_CAPSULE | Freq: Two times a day (BID) | ORAL | Status: DC
  Administered 2018-09-16 – 2018-09-23 (×15): 75 mg via ORAL
  Filled 2018-09-15: qty 1
  Filled 2018-09-15: qty 3
  Filled 2018-09-15 (×9): qty 1
  Filled 2018-09-15: qty 3
  Filled 2018-09-15 (×4): qty 1

## 2018-09-15 MED ORDER — INSULIN ASPART 100 UNIT/ML ~~LOC~~ SOLN
0.0000 [IU] | Freq: Three times a day (TID) | SUBCUTANEOUS | Status: DC
  Administered 2018-09-16 (×2): 5 [IU] via SUBCUTANEOUS
  Administered 2018-09-16: 2 [IU] via SUBCUTANEOUS
  Administered 2018-09-17: 8 [IU] via SUBCUTANEOUS
  Administered 2018-09-17 (×2): 5 [IU] via SUBCUTANEOUS
  Administered 2018-09-18 (×3): 3 [IU] via SUBCUTANEOUS
  Administered 2018-09-19: 5 [IU] via SUBCUTANEOUS
  Administered 2018-09-19 – 2018-09-20 (×2): 3 [IU] via SUBCUTANEOUS
  Administered 2018-09-20: 2 [IU] via SUBCUTANEOUS
  Administered 2018-09-20: 5 [IU] via SUBCUTANEOUS
  Administered 2018-09-21: 3 [IU] via SUBCUTANEOUS
  Administered 2018-09-21: 2 [IU] via SUBCUTANEOUS
  Administered 2018-09-21 – 2018-09-22 (×2): 5 [IU] via SUBCUTANEOUS
  Administered 2018-09-22: 3 [IU] via SUBCUTANEOUS
  Administered 2018-09-22: 5 [IU] via SUBCUTANEOUS
  Administered 2018-09-23 (×2): 3 [IU] via SUBCUTANEOUS

## 2018-09-15 MED ORDER — DOCUSATE SODIUM 100 MG PO CAPS
100.0000 mg | ORAL_CAPSULE | Freq: Two times a day (BID) | ORAL | Status: DC | PRN
  Administered 2018-09-16 – 2018-09-19 (×3): 100 mg via ORAL
  Filled 2018-09-15 (×3): qty 1

## 2018-09-15 MED ORDER — SODIUM CHLORIDE 0.9 % IV SOLN
2.0000 g | Freq: Once | INTRAVENOUS | Status: AC
  Administered 2018-09-15: 2 g via INTRAVENOUS
  Filled 2018-09-15: qty 2

## 2018-09-15 MED ORDER — SODIUM CHLORIDE 0.9 % IV SOLN: 2.0000 g | Freq: Once | INTRAVENOUS | Status: DC

## 2018-09-15 MED ORDER — BACLOFEN 20 MG PO TABS
20.0000 mg | ORAL_TABLET | Freq: Four times a day (QID) | ORAL | Status: DC | PRN
  Administered 2018-09-16 – 2018-09-23 (×12): 20 mg via ORAL
  Filled 2018-09-15: qty 4
  Filled 2018-09-15 (×4): qty 1
  Filled 2018-09-15: qty 4
  Filled 2018-09-15 (×8): qty 1

## 2018-09-15 NOTE — Progress Notes (Signed)
Notified provider of need to order fluid bolus.  ?

## 2018-09-15 NOTE — ED Triage Notes (Signed)
Pt arrived from home via [facility] c/o fever and fatigue. EMS reported room air Sp02 ~90%. 98% on 3Lpm [place]. Pt is alert and oriented x4. No fever noted at time of triage.

## 2018-09-15 NOTE — ED Notes (Signed)
Patient transported to ct

## 2018-09-15 NOTE — ED Provider Notes (Signed)
[facility] EMERGENCY DEPARTMENT Provider Note   CSN: [id number] Arrival date & time: 09/15/18  1116    History   Chief Complaint No chief complaint on file.   HPI [name] is a 68 y.o. female.     68 year old female with past medical history below including paraplegia, neurogenic bladder status post suprapubic catheter, type 2 diabetes mellitus, hypertension, hyperlipidemia, hypothyroidism, breast cancer who presents with fever and fatigue.  Patient was brought in by EMS from home where family member reported fever and fatigue.  She was approximately 90% on room air and was placed on supplemental oxygen by nasal cannula.  She has been sleepy here and difficult to answer questions.  LEVEL 5 CAVEAT DUE TO AMS  The history is provided by the spouse and the EMS personnel.    Past Medical History:  Diagnosis Date  . Anemia   . Arthritis    "all over" (04/13/2012)  . Cancer of right breast ([place]) 2011  . Cervical neuropathy    PERIPHERAL NEUROPATHY , HAD CERVICAL FUSION  . Diabetes mellitus without complication ([place])   . Difficult intubation    Fastrach #4 LMA then # 7 ETT used in 2006 [facility], cervical laminectomy)  . GERD (gastroesophageal reflux disease)   . History of kidney stones   . Hypercholesteremia   . Hypertension   . Hypothyroidism   . Kidney stones 1990's   "twice; passed in hospital on my own" (04/13/2012)  . Kidney stones 08/10/2015  . Paraplegia ([place])   . PONV (postoperative nausea and vomiting)   . Sacral decubitus ulcer 10/2014    Patient Active Problem List   Diagnosis Date Noted  . Diabetes mellitus type 2, controlled ([place]) 04/26/2017  . Urinary tract infection associated with cystostomy catheter ([place])   . Medication monitoring encounter 12/03/2015  . Hypertension   . History of nephrolithiasis   . AKI (acute kidney injury) ([place]) 08/10/2015  . Sepsis ([place]) 08/10/2015  . Hypotension 08/10/2015  . Altered mental status  08/10/2015  . Leukocytosis 08/10/2015  . Kidney stones 08/10/2015  . Acute kidney injury ([place])   . Encephalopathy acute   . Osteomyelitis ([place]) 11/06/2014  . Sacral decubitus ulcer   . Paraplegia ([place]) 11/01/2014  . Sacral ulcer ([place]) 10/31/2014  . Hypothyroidism 10/31/2014  . Breast cancer of lower-inner quadrant of right female breast ([place]) 12/22/2012  . HTN (hypertension) 04/15/2012  . Left knee DJD 04/13/2012    Class: Chronic    Past Surgical History:  Procedure Laterality Date  . ABDOMINAL HYSTERECTOMY  ~ 1976  . BACK SURGERY    . BREAST BIOPSY Right 2011  . BREAST LUMPECTOMY Right 2011  . CALDWELL LUC  ~ 2003   "benign tumor removed from up under gum" (04/13/2012)  . DACROCYSTORHINOSTOMY  ~ 2000   "put stents in my tear ducts; both eyes" (04/13/2012)  . EYE SURGERY  2004   STENTS TO BIL EYES  . I&D EXTREMITY Bilateral 11/03/2014   Procedure: IRRIGATION AND DEBRIDEMENT BILATERAL HEEL;  Surgeon: Irene Limbo, MD;  Location: [facility];  Service: Plastics;  Laterality: Bilateral;  . INCISION AND DRAINAGE OF WOUND Left 11/03/2014   Procedure: IRRIGATION AND DEBRIDEMENT SACRAL ULCER;  Surgeon: Irene Limbo, MD;  Location: [facility];  Service: Plastics;  Laterality: Left;  . JOINT REPLACEMENT    . POSTERIOR CERVICAL LAMINECTOMY  2006  . SKIN SPLIT GRAFT Left 08/05/2016   Procedure: SURGICAL PREP  FOR GRAFTING APPLICATION ACELL TO LEFT ISCHIUM;  Surgeon:  Irene Limbo, MD;  Location: [facility];  Service: Plastics;  Laterality: Left;  . TONSILLECTOMY AND ADENOIDECTOMY  ~ 1954  . TOTAL KNEE ARTHROPLASTY WITH REVISION COMPONENTS  04/13/2012   Procedure: TOTAL KNEE ARTHROPLASTY WITH REVISION COMPONENTS;  Surgeon: Hessie Dibble, MD;  Location: [facility];  Service: Orthopedics;  Laterality: Left;     OB History   No obstetric history on file.      Home Medications    Prior to Admission medications   Medication Sig Start Date End Date Taking? Authorizing Provider  ascorbic acid  (VITAMIN C) 500 MG tablet Take 1 tablet (500 mg total) by mouth daily. 11/07/14   Francesca Oman, DO  baclofen (LIORESAL) 20 MG tablet Take 20 mg by mouth 4 (four) times daily. Take 1 tablet (20 mg) by mouth 4 times daily - 7am, 12pm, 4pm, 8pm 09/21/15   [provider]  ciprofloxacin (CIPRO) 500 MG tablet Take 1 tablet (500 mg total) by mouth 2 (two) times daily. 08/15/17   Isla Pence, MD  docusate sodium (COLACE) 100 MG capsule Take 100 mg by mouth daily as needed (FOR CONSTIPATION).     [provider]  DULoxetine (CYMBALTA) 60 MG capsule Take 60 mg by mouth 2 (two) times daily.     [provider]  ferrous sulfate 325 (65 FE) MG tablet Take 325 mg by mouth daily after supper.     [provider]  folic acid (FOLVITE) 1 MG tablet Take 1 mg by mouth daily after supper.     [provider]  furosemide (LASIX) 40 MG tablet Take 40 mg by mouth daily after supper.     [provider]  gabapentin (NEURONTIN) 800 MG tablet Take 1,600 mg by mouth 2 (two) times daily. 09/22/14   [provider]  levothyroxine (SYNTHROID, LEVOTHROID) 125 MCG tablet Take 1 tablet (125 mcg total) by mouth daily before breakfast. Patient taking differently: Take 125 mcg by mouth daily after supper.  11/07/14   Francesca Oman, DO  metFORMIN (GLUCOPHAGE) 500 MG tablet Take 250 mg by mouth 2 (two) times daily with a meal.    [provider]  metoprolol (LOPRESSOR) 100 MG tablet Take 100 mg by mouth 2 (two) times daily.  10/28/14   [provider]  Morphine Sulfate ER 30 MG T12A Take 30 mg by mouth 2 (two) times daily.    [provider]  Multiple Vitamin (MULTIVITAMIN WITH MINERALS) TABS tablet Take 1 tablet by mouth daily. 11/07/14   Francesca Oman, DO  NIFEdipine (PROCARDIA XL/ADALAT-CC) 30 MG 24 hr tablet Take 30 mg by mouth daily after supper.     [provider]  oxyCODONE-acetaminophen (PERCOCET) 10-325 MG tablet Take 1 tablet by  mouth every 4 (four) hours as needed for pain.    [provider]  zinc sulfate 220 MG capsule Take 1 capsule (220 mg total) by mouth daily. 11/07/14   Francesca Oman, DO    Family History Family History  Problem Relation Age of Onset  . Heart disease Father     Social History Social History   Tobacco Use  . Smoking status: Former Smoker    Packs/day: 0.50    Years: 50.00    Pack years: 25.00    Types: Cigarettes    Last attempt to quit: 11/06/2014    Years since quitting: 3.8  . Smokeless tobacco: Never Used  Substance Use Topics  . Alcohol use: No  . Drug  use: No     Allergies   Ivp dye [iodinated diagnostic agents]; Aspirin; and Sulfa antibiotics   Review of Systems Review of Systems  Unable to perform ROS: Mental status change     Physical Exam Updated Vital Signs BP (!) 80/56   Pulse (!) 101   Temp 99.8 F (37.7 C) (Rectal)   Resp 15   Ht 5\' 6"  (1.676 m)   Wt 90.7 kg   SpO2 100%   BMI 32.28 kg/m   Physical Exam Vitals signs and nursing note reviewed.  Constitutional:      General: She is not in acute distress.    Appearance: She is well-developed.     Comments: Somnolent, opens eyes to voice but falls back asleep  HENT:     Head: Normocephalic and atraumatic.  Eyes:     Conjunctiva/sclera: Conjunctivae normal.     Pupils: Pupils are equal, round, and reactive to light.  Neck:     Musculoskeletal: Neck supple.  Cardiovascular:     Rate and Rhythm: Normal rate and regular rhythm.     Heart sounds: Murmur present.  Pulmonary:     Comments: Very mildly increased WOB, no respiratory distress, diminished breath sounds b/l Abdominal:     General: Bowel sounds are normal. There is no distension.     Palpations: Abdomen is soft.     Tenderness: There is no abdominal tenderness.     Comments: Suprapubic catheter in place  Musculoskeletal:     Right lower leg: Edema present.     Left lower leg: Edema present.  Skin:    General: Skin is warm  and dry.  Neurological:     Comments: Confused, falls asleep quickly      ED Treatments / Results  Labs (all labs ordered are listed, but only abnormal results are displayed) Labs Reviewed  LACTIC ACID, PLASMA - Abnormal; Notable for the following components:      Result Value   Lactic Acid, Venous 5.5 (*)    All other components within normal limits  COMPREHENSIVE METABOLIC PANEL - Abnormal; Notable for the following components:   Glucose, Bld 270 (*)    BUN 30 (*)    Creatinine, Ser 1.44 (*)    Albumin 3.3 (*)    AST 51 (*)    GFR calc non Af Amer 37 (*)    GFR calc Af Amer 43 (*)    All other components within normal limits  CBC WITH DIFFERENTIAL/PLATELET - Abnormal; Notable for the following components:   WBC 16.8 (*)    Neutro Abs 15.6 (*)    Lymphs Abs 0.4 (*)    Abs Immature Granulocytes 0.15 (*)    All other components within normal limits  URINALYSIS, ROUTINE W REFLEX MICROSCOPIC - Abnormal; Notable for the following components:   Color, Urine AMBER (*)    APPearance CLOUDY (*)    Hgb urine dipstick MODERATE (*)    Ketones, ur 5 (*)    Protein, ur 100 (*)    Leukocytes,Ua LARGE (*)    WBC, UA >50 (*)    Bacteria, UA MANY (*)    All other components within normal limits  POCT I-STAT EG7 - Abnormal; Notable for the following components:   pH, Ven 7.434 (*)    pCO2, Ven 36.9 (*)    pO2, Ven 100.0 (*)    Calcium, Ion 1.07 (*)    All other components within normal limits  SARS CORONAVIRUS 2 (HOSPITAL ORDER, PERFORMED IN  [facility])  CULTURE, BLOOD (ROUTINE X 2)  CULTURE, BLOOD (ROUTINE X 2)  URINE CULTURE  BRAIN NATRIURETIC PEPTIDE  TROPONIN I  LACTIC ACID, PLASMA  TSH  I-STAT VENOUS BLOOD GAS, ED    EKG EKG Interpretation  Date/Time:  Wednesday September 15 2018 11:24:11 EDT Ventricular Rate:  105 PR Interval:    QRS Duration: 81 QT Interval:  362 QTC Calculation: 479 R Axis:   -2 Text Interpretation:  Sinus tachycardia Left ventricular  hypertrophy similar to previous Confirmed by Theotis Burrow [id number]) on 09/15/2018 11:31:33 AM   Radiology Ct Head Wo Contrast  Result Date: 09/15/2018 CLINICAL DATA:  Altered level of consciousness. EXAM: CT HEAD WITHOUT CONTRAST TECHNIQUE: Contiguous axial images were obtained from the base of the skull through the vertex without intravenous contrast. COMPARISON:  CT scan of Aug 10, 2015. FINDINGS: Brain: Minimal chronic ischemic white matter disease is noted. No mass effect or midline shift is noted. Ventricular size is within normal limits. There is no evidence of mass lesion, hemorrhage or acute infarction. Vascular: No hyperdense vessel or unexpected calcification. Skull: Normal. Negative for fracture or focal lesion. Sinuses/Orbits: No acute finding. Other: None. IMPRESSION: Minimal chronic ischemic white matter disease. No acute intracranial abnormality seen. Electronically Signed   By: Marijo Conception M.D.   On: 09/15/2018 13:34   Dg Chest Port 1 View  Result Date: 09/15/2018 CLINICAL DATA:  Fever, hypoxia and altered mental status. EXAM: PORTABLE CHEST 1 VIEW COMPARISON:  04/26/2017 FINDINGS: The heart is borderline enlarged but stable. The mediastinal and hilar contours are slightly prominent but unchanged. Low lung volumes with vascular crowding and atelectasis. Underlying chronic lung changes. No definite infiltrates or effusions. The bony thorax is intact. IMPRESSION: Low lung volumes with vascular crowding and atelectasis and underlying chronic lung changes but no definite acute infiltrates. Electronically Signed   By: Marijo Sanes M.D.   On: 09/15/2018 12:21    Procedures Procedures (including critical care time)  Medications Ordered in ED Medications  metroNIDAZOLE (FLAGYL) IVPB 500 mg (500 mg Intravenous New Bag/Given 09/15/18 1459)  vancomycin (VANCOCIN) 1,500 mg in sodium chloride 0.9 % 500 mL IVPB ( Intravenous Restarted 09/15/18 1425)  vancomycin (VANCOCIN) 1,250 mg in sodium  chloride 0.9 % 250 mL IVPB (has no administration in time range)  ceFEPIme (MAXIPIME) 2 g in sodium chloride 0.9 % 100 mL IVPB (has no administration in time range)  sodium chloride 0.9 % bolus 1,000 mL (has no administration in time range)  norepinephrine (LEVOPHED) 4mg  in 220mL premix infusion (has no administration in time range)  sodium chloride 0.9 % bolus 1,000 mL (0 mLs Intravenous Stopped 09/15/18 1459)  ceFEPIme (MAXIPIME) 2 g in sodium chloride 0.9 % 100 mL IVPB (0 g Intravenous Stopped 09/15/18 1458)     Initial Impression / Assessment and Plan / ED Course  I have reviewed the triage vital signs and the nursing notes.  Pertinent labs & imaging results that were available during my care of the patient were reviewed by me and considered in my medical decision making (see chart for details).       Pt somnolent but arousable on exam, protecting airway. Stable on 3L [place], T 99.8.  Obtained lab work including blood and urine cultures.  CMP shows creatinine 1.44, WBC 16.8, initial lactate 5.5, shows large amount of leukocytes, RBCs, and WBCs with bacteria however difficult to interpret given that she has suprapubic catheter.  We will exchange catheter in the event that  this is source of infection.  CT negative acute.  Chest x-ray without obvious infiltrate, possible chronic lung changes but no pulmonary edema.  Have given sequential fluid boluses with close monitoring of her respiratory status as she does have a small oxygen requirement currently.  Discussed admission with [facility], Dr. Benny Lennert,     Sepsis - Repeat Assessment  Performed at:    3:25pm  Vitals     Blood pressure (!) 80/56, pulse (!) 101, temperature 99.8 F (37.7 C), temperature source Rectal, resp. rate 15, SpO2 100 %.  Heart:     Tachycardic  Lungs:    CTA  Capillary Refill:   <2 sec  Peripheral Pulse:   Dorsalis pedis pulse  palpable  Skin:     Normal Color    Final Clinical Impressions(s) / ED Diagnoses    Final diagnoses:  Sepsis, due to unspecified organism, unspecified whether acute organ dysfunction present [facility])    ED Discharge Orders    None       Little, Wenda Overland, MD 09/15/18 1554

## 2018-09-15 NOTE — ED Notes (Signed)
Pt's husband updated on plan of care

## 2018-09-15 NOTE — Progress Notes (Addendum)
Pharmacy Antibiotic Note  [name] is a 68 y.o. female admitted on 09/15/2018 with complaints of fever and fatigue. Patient on 3LNC, WBC 16.8, Tmax 99.8, Scr 1.44 with CrCl 57mL/min. Patient with AKI given baseline Scr ~0.9. As of 07/2018, weight 79.4kg and height 63".  Pharmacy has been consulted for vancomycin and cefepime dosing.  Plan: Vancomycin 1500mg  x1 Vancomycin 1250mg  q48hrs Estimated AUC 536.2 Cefepime 2g q12hrs Monitor renal function, culture data, LOT, vanc levels as needed   Temp (24hrs), Avg:99.2 F (37.3 C), Min:98.5 F (36.9 C), Max:99.8 F (37.7 C)  Recent Labs  Lab 09/15/18 1153  WBC 16.8*  CREATININE 1.44*  LATICACIDVEN 5.5*    CrCl cannot be calculated (Unknown ideal weight.).    Allergies  Allergen Reactions  . Ivp Dye [Iodinated Diagnostic Agents] Other (See Comments)    "hot and sweaty and almost passed out"  . Aspirin Hives  . Sulfa Antibiotics Hives    Antimicrobials this admission: Vancomycin 6/10 >> Cefepime 6/10 >>  Microbiology results: Pending  Thank you for allowing pharmacy to be a part of this patient's care.  Janae Bridgeman, PharmD PGY1 Pharmacy Resident Phone: [phone] 09/15/2018 2:05 PM

## 2018-09-15 NOTE — ED Notes (Signed)
Report attempted 

## 2018-09-15 NOTE — ED Notes (Signed)
Got patient undress on the monitor did ekg shown to provider patient is resting with call bell in reach

## 2018-09-15 NOTE — ED Provider Notes (Signed)
  Physical Exam  BP (!) 80/56   Pulse (!) 101   Temp 99.8 F (37.7 C) (Rectal)   Resp 15   SpO2 100%   Physical Exam  ED Course/Procedures     Procedures  MDM  Assuming care of patient from Dr. Rex Kras.   Patient in the ED for fevers and ams. Workup thus far shows likely urinary source.  Concerning findings are as following: elevated lactic acid and low BP. Important pending results are: repeat lactate.  According to Dr. Rex Kras, plan is to monitor patients closely. Medicine admitting.  I have ordered another 1 liter bolus. She has received 1 liter already.  Patient's height is 5.6 and she is obese with BMI of 32. Her IBW is 59 Kg. So giving the 2nd liter will get [place] to 30cc/kg of ibw.  Pressors will be ordered as well to maintain MAP of 65.  Sepsis hemodynamic monitoring completed. BP improved with 2nd liter infusing. Pressor cancelled.  I asked phlebotomy to get the repeat lactic acid that is still pending.  CRITICAL CARE Performed by: Mychael Soots   Total critical care time: 36 minutes  Critical care time was exclusive of separately billable procedures and treating other patients.  Critical care was necessary to treat or prevent imminent or life-threatening deterioration.  Critical care was time spent personally by me on the following activities: development of treatment plan with patient and/or surrogate as well as nursing, discussions with consultants, evaluation of patient's response to treatment, examination of patient, obtaining history from patient or surrogate, ordering and performing treatments and interventions, ordering and review of laboratory studies, ordering and review of radiographic studies, pulse oximetry and re-evaluation of patient's condition.       Varney Biles, MD 09/15/18 1758

## 2018-09-15 NOTE — ED Notes (Signed)
Suprapubic catheter changed by EDP

## 2018-09-15 NOTE — ED Notes (Signed)
ED TO INPATIENT HANDOFF REPORT  ED Nurse Name and Phone #: Boykin Peek RN [phone]  S Name/Age/Gender [name] 68 y.o. female Room/Bed: 014C/014C  Code Status   Code Status: Full Code  Home/SNF/Other Home Patient oriented to: self Is this baseline? No   Triage Complete: Triage complete  Chief Complaint Fever  Triage Note Pt arrived from home via [facility] c/o fever and fatigue. EMS reported room air Sp02 ~90%. 98% on 3Lpm [place]. Pt is alert and oriented x4. No fever noted at time of triage.   Allergies Allergies  Allergen Reactions  . Ivp Dye [Iodinated Diagnostic Agents] Other (See Comments)    "hot and sweaty and almost passed out"  . Aspirin Hives  . Sulfa Antibiotics Hives    Level of Care/Admitting Diagnosis ED Disposition    ED Disposition Condition [facility] Area: [place] [100100]  Level of Care: Telemetry Medical [104]  Covid Evaluation: Screening Protocol (No Symptoms)  Diagnosis: Sepsis due to urinary tract infection [facility]) [[id number]]  Admitting Physician: Monna Fam  Attending Physician: Benny Lennert, AVA [place].Pepper  Estimated length of stay: 5 - 7 days  Certification:: I certify this patient will need inpatient services for at least 2 midnights  PT Class (Do Not Modify): Inpatient [101]  PT Acc Code (Do Not Modify): Private [1]       B Medical/Surgery History Past Medical History:  Diagnosis Date  . Anemia   . Arthritis    "all over" (04/13/2012)  . Cancer of right breast ([place]) 2011  . Cervical neuropathy    PERIPHERAL NEUROPATHY , HAD CERVICAL FUSION  . Diabetes mellitus without complication ([place])   . Difficult intubation    Fastrach #4 LMA then # 7 ETT used in 2006 [facility], cervical laminectomy)  . GERD (gastroesophageal reflux disease)   . History of kidney stones   . Hypercholesteremia   . Hypertension   . Hypothyroidism   . Kidney stones 1990's   "twice; passed in hospital on my own" (04/13/2012)  .  Kidney stones 08/10/2015  . Paraplegia ([place])   . PONV (postoperative nausea and vomiting)   . Sacral decubitus ulcer 10/2014   Past Surgical History:  Procedure Laterality Date  . ABDOMINAL HYSTERECTOMY  ~ 1976  . BACK SURGERY    . BREAST BIOPSY Right 2011  . BREAST LUMPECTOMY Right 2011  . CALDWELL LUC  ~ 2003   "benign tumor removed from up under gum" (04/13/2012)  . DACROCYSTORHINOSTOMY  ~ 2000   "put stents in my tear ducts; both eyes" (04/13/2012)  . EYE SURGERY  2004   STENTS TO BIL EYES  . I&D EXTREMITY Bilateral 11/03/2014   Procedure: IRRIGATION AND DEBRIDEMENT BILATERAL HEEL;  Surgeon: Irene Limbo, MD;  Location: [facility];  Service: Plastics;  Laterality: Bilateral;  . INCISION AND DRAINAGE OF WOUND Left 11/03/2014   Procedure: IRRIGATION AND DEBRIDEMENT SACRAL ULCER;  Surgeon: Irene Limbo, MD;  Location: [facility];  Service: Plastics;  Laterality: Left;  . JOINT REPLACEMENT    . POSTERIOR CERVICAL LAMINECTOMY  2006  . SKIN SPLIT GRAFT Left 08/05/2016   Procedure: SURGICAL PREP  FOR GRAFTING APPLICATION ACELL TO LEFT ISCHIUM;  Surgeon: Irene Limbo, MD;  Location: [facility];  Service: Plastics;  Laterality: Left;  . TONSILLECTOMY AND ADENOIDECTOMY  ~ 1954  . TOTAL KNEE ARTHROPLASTY WITH REVISION COMPONENTS  04/13/2012   Procedure: TOTAL KNEE ARTHROPLASTY WITH REVISION COMPONENTS;  Surgeon: Hessie Dibble, MD;  Location:  [facility] OR;  Service: Orthopedics;  Laterality: Left;     A IV Location/Drains/Wounds Patient Lines/Drains/Airways Status   Active Line/Drains/Airways    Name:   Placement date:   Placement time:   Site:   Days:   Peripheral IV 09/15/18 Left Wrist   09/15/18    1058    Wrist   less than 1   Peripheral IV 09/15/18 Right Forearm   09/15/18    1159    Forearm   less than 1   Pressure Injury 04/27/17 Stage IV - Full thickness tissue loss with exposed bone, tendon or muscle. chronic stage 4 pressure injury to upper wound, stage 3 presure injury to lower wound    04/27/17    -     506          Intake/Output Last 24 hours  Intake/Output Summary (Last 24 hours) at 09/15/2018 1902 Last data filed at 09/15/2018 1459 Gross per 24 hour  Intake 1057.2 ml  Output -  Net 1057.2 ml    Labs/Imaging Results for orders placed or performed during the hospital encounter of 09/15/18 (from the past 48 hour(s))  Lactic acid, plasma     Status: Abnormal   Collection Time: 09/15/18 11:53 AM  Result Value Ref Range   Lactic Acid, Venous 5.5 (HH) 0.5 - 1.9 mmol/L    Comment: CRITICAL RESULT CALLED TO, READ BACK BY AND VERIFIED WITH: JESSICA EASLEY,RN AT  1335 09/15/2018 BY ZBEECH. Performed at [facility], [place] [address], [place], [place] [address]   Comprehensive metabolic panel     Status: Abnormal   Collection Time: 09/15/18 11:53 AM  Result Value Ref Range   Sodium 137 135 - 145 mmol/L   Potassium 4.8 3.5 - 5.1 mmol/L   Chloride 100 98 - 111 mmol/L   CO2 23 22 - 32 mmol/L   Glucose, Bld 270 (H) 70 - 99 mg/dL   BUN 30 (H) 8 - 23 mg/dL   Creatinine, Ser 1.44 (H) 0.44 - 1.00 mg/dL   Calcium 9.1 8.9 - 10.3 mg/dL   Total Protein 7.3 6.5 - 8.1 g/dL   Albumin 3.3 (L) 3.5 - 5.0 g/dL   AST 51 (H) 15 - 41 U/L   ALT 44 0 - 44 U/L   Alkaline Phosphatase 59 38 - 126 U/L   Total Bilirubin 0.9 0.3 - 1.2 mg/dL   GFR calc non Af Amer 37 (L) >60 mL/min   GFR calc Af Amer 43 (L) >60 mL/min   Anion gap 14 5 - 15    Comment: Performed at [facility] [address] [place], [place] [address]  CBC WITH DIFFERENTIAL     Status: Abnormal   Collection Time: 09/15/18 11:53 AM  Result Value Ref Range   WBC 16.8 (H) 4.0 - 10.5 K/uL   RBC 4.56 3.87 - 5.11 MIL/uL   Hemoglobin 13.9 12.0 - 15.0 g/dL   HCT 44.1 36.0 - 46.0 %   MCV 96.7 80.0 - 100.0 fL   MCH 30.5 26.0 - 34.0 pg   MCHC 31.5 30.0 - 36.0 g/dL   RDW 13.4 11.5 - 15.5 %   Platelets 192 150 - 400 K/uL   nRBC 0.0 0.0 - 0.2 %   Neutrophils Relative % 92 %   Neutro Abs 15.6 (H) 1.7 - 7.7 K/uL    Lymphocytes Relative 3 %   Lymphs Abs 0.4 (L) 0.7 - 4.0 K/uL   Monocytes Relative 4 %  Monocytes Absolute 0.6 0.1 - 1.0 K/uL   Eosinophils Relative 0 %   Eosinophils Absolute 0.0 0.0 - 0.5 K/uL   Basophils Relative 0 %   Basophils Absolute 0.0 0.0 - 0.1 K/uL   Immature Granulocytes 1 %   Abs Immature Granulocytes 0.15 (H) 0.00 - 0.07 K/uL    Comment: Performed at [facility] [address], [place], [place] [address]  Urinalysis, Routine w reflex microscopic     Status: Abnormal   Collection Time: 09/15/18 11:53 AM  Result Value Ref Range   Color, Urine AMBER (A) YELLOW    Comment: BIOCHEMICALS MAY BE AFFECTED BY COLOR   APPearance CLOUDY (A) CLEAR   Specific Gravity, Urine 1.019 1.005 - 1.030   pH 5.0 5.0 - 8.0   Glucose, UA NEGATIVE NEGATIVE mg/dL   Hgb urine dipstick MODERATE (A) NEGATIVE   Bilirubin Urine NEGATIVE NEGATIVE   Ketones, ur 5 (A) NEGATIVE mg/dL   Protein, ur 100 (A) NEGATIVE mg/dL   Nitrite NEGATIVE NEGATIVE   Leukocytes,Ua LARGE (A) NEGATIVE   RBC / HPF 21-50 0 - 5 RBC/hpf   WBC, UA >50 (H) 0 - 5 WBC/hpf   Bacteria, UA MANY (A) NONE SEEN   Squamous Epithelial / LPF 0-5 0 - 5   WBC Clumps PRESENT     Comment: Performed at [facility], [address] [place], [place] [address]  Brain natriuretic peptide     Status: None   Collection Time: 09/15/18 11:53 AM  Result Value Ref Range   B Natriuretic Peptide 76.7 0.0 - 100.0 pg/mL    Comment: Performed at [facility] [address] [place], [place] [address]  Troponin I - ONCE - STAT     Status: None   Collection Time: 09/15/18 11:53 AM  Result Value Ref Range   Troponin I <0.03 <0.03 ng/mL    Comment: Performed at [facility] [address] [place], [place] [address]  SARS Coronavirus 2 (CEPHEID- Performed in [facility] hospital lab), Hosp Order     Status: None   Collection Time: 09/15/18 11:54 AM  Result Value Ref Range   SARS Coronavirus 2 NEGATIVE NEGATIVE    Comment:  (NOTE) If result is NEGATIVE SARS-CoV-2 target nucleic acids are NOT DETECTED. The SARS-CoV-2 RNA is generally detectable in upper and lower  respiratory specimens during the acute phase of infection. The lowest  concentration of SARS-CoV-2 viral copies this assay can detect is 250  copies / mL. A negative result does not preclude SARS-CoV-2 infection  and should not be used as the sole basis for treatment or other  patient management decisions.  A negative result may occur with  improper specimen collection / handling, submission of specimen other  than nasopharyngeal swab, presence of viral mutation(s) within the  areas targeted by this assay, and inadequate number of viral copies  (<250 copies / mL). A negative result must be combined with clinical  observations, patient history, and epidemiological information. If result is POSITIVE SARS-CoV-2 target nucleic acids are DETECTED. The SARS-CoV-2 RNA is generally detectable in upper and lower  respiratory specimens dur ing the acute phase of infection.  Positive  results are indicative of active infection with SARS-CoV-2.  Clinical  correlation with patient history and other diagnostic information is  necessary to determine patient infection status.  Positive results do  not rule out bacterial infection or co-infection with other viruses. If result is PRESUMPTIVE POSTIVE SARS-CoV-2 nucleic  acids MAY BE PRESENT.   A presumptive positive result was obtained on the submitted specimen  and confirmed on repeat testing.  While 2019 novel coronavirus  (SARS-CoV-2) nucleic acids may be present in the submitted sample  additional confirmatory testing may be necessary for epidemiological  and / or clinical management purposes  to differentiate between  SARS-CoV-2 and other Sarbecovirus currently known to infect humans.  If clinically indicated additional testing with an alternate test  methodology [phone]) is advised. The SARS-CoV-2 RNA is  generally  detectable in upper and lower respiratory sp ecimens during the acute  phase of infection. The expected result is Negative. Fact Sheet for Patients:  StrictlyIdeas.no Fact Sheet for Healthcare Providers: BankingDealers.co.za This test is not yet approved or cleared by the [place] FDA and has been authorized for detection and/or diagnosis of SARS-CoV-2 by FDA under an Emergency Use Authorization (EUA).  This EUA will remain in effect (meaning this test can be used) for the duration of the COVID-19 declaration under Section 564(b)(1) of the Act, 21 U.S.C. section 360bbb-3(b)(1), unless the authorization is terminated or revoked sooner. Performed at [facility], [place] [address], [place], [place] [address]   POCT I-Stat EG7     Status: Abnormal   Collection Time: 09/15/18  2:03 PM  Result Value Ref Range   pH, Ven 7.434 (H) 7.250 - 7.430   pCO2, Ven 36.9 (L) 44.0 - 60.0 mmHg   pO2, Ven 100.0 (H) 32.0 - 45.0 mmHg   Bicarbonate 24.7 20.0 - 28.0 mmol/L   TCO2 26 22 - 32 mmol/L   O2 Saturation 98.0 %   Acid-Base Excess 1.0 0.0 - 2.0 mmol/L   Sodium 138 135 - 145 mmol/L   Potassium 4.8 3.5 - 5.1 mmol/L   Calcium, Ion 1.07 (L) 1.15 - 1.40 mmol/L   HCT 40.0 36.0 - 46.0 %   Hemoglobin 13.6 12.0 - 15.0 g/dL   Patient temperature HIDE    Sample type VENOUS   Lactic acid, plasma     Status: Abnormal   Collection Time: 09/15/18  5:02 PM  Result Value Ref Range   Lactic Acid, Venous 2.9 (HH) 0.5 - 1.9 mmol/L    Comment: L Abuk Selleck,RN 1815 09/15/2018 WBOND Performed at [facility] [address], [place], [place] [address]   CBC     Status: Abnormal   Collection Time: 09/15/18  5:02 PM  Result Value Ref Range   WBC 11.4 (H) 4.0 - 10.5 K/uL   RBC 4.04 3.87 - 5.11 MIL/uL   Hemoglobin 12.5 12.0 - 15.0 g/dL   HCT 39.6 36.0 - 46.0 %   MCV 98.0 80.0 - 100.0 fL   MCH 30.9 26.0 - 34.0 pg   MCHC 31.6 30.0 - 36.0 g/dL    RDW 13.5 11.5 - 15.5 %   Platelets 156 150 - 400 K/uL   nRBC 0.0 0.0 - 0.2 %    Comment: Performed at [facility], [place] [address] [place], [place] [address]  Creatinine, serum     Status: Abnormal   Collection Time: 09/15/18  5:02 PM  Result Value Ref Range   Creatinine, Ser 1.23 (H) 0.44 - 1.00 mg/dL   GFR calc non Af Amer 44 (L) >60 mL/min   GFR calc Af Amer 51 (L) >60 mL/min    Comment: Performed at [facility] [address], [place], [place] [address]  Magnesium     Status: Abnormal   Collection  Time: 09/15/18  5:02 PM  Result Value Ref Range   Magnesium 1.4 (L) 1.7 - 2.4 mg/dL    Comment: Performed at [facility] [address], [place], [place] [address]  Phosphorus     Status: None   Collection Time: 09/15/18  5:02 PM  Result Value Ref Range   Phosphorus 2.6 2.5 - 4.6 mg/dL    Comment: Performed at [facility] [address], [place], [place] [address]  I-STAT 7, (LYTES, BLD GAS, ICA, H+H)     Status: Abnormal   Collection Time: 09/15/18  5:39 PM  Result Value Ref Range   pH, Arterial 7.281 (L) 7.350 - 7.450   pCO2 arterial 57.0 (H) 32.0 - 48.0 mmHg   pO2, Arterial 84.0 83.0 - 108.0 mmHg   Bicarbonate 26.7 20.0 - 28.0 mmol/L   TCO2 28 22 - 32 mmol/L   O2 Saturation 94.0 %   Acid-base deficit 1.0 0.0 - 2.0 mmol/L   Sodium 140 135 - 145 mmol/L   Potassium 4.6 3.5 - 5.1 mmol/L   Calcium, Ion 1.25 1.15 - 1.40 mmol/L   HCT 36.0 36.0 - 46.0 %   Hemoglobin 12.2 12.0 - 15.0 g/dL   Patient temperature 99.6 F    Collection site RADIAL, ALLEN'S TEST ACCEPTABLE    Drawn by RT    Sample type ARTERIAL    Ct Abdomen Pelvis Wo Contrast  Result Date: 09/15/2018 CLINICAL DATA:  High grade bowel obstruction, history breast cancer, diabetes mellitus, hypertension, GERD, kidney stones EXAM: CT ABDOMEN AND PELVIS WITHOUT CONTRAST TECHNIQUE: Multidetector CT imaging of the abdomen and pelvis was performed following the standard protocol without IV  contrast. Sagittal and coronal MPR images reconstructed from axial data set. No oral contrast was administered COMPARISON:  04/27/2017 FINDINGS: Lower chest: Bibasilar atelectasis and minimal RIGHT pleural effusion Hepatobiliary: Diffuse fatty infiltration of liver. Minimal dependent density in gallbladder question tiny layered calculi. No focal hepatic mass lesion. Pancreas: Atrophic pancreas without mass Spleen: Calcified granulomata.  No mass. Adrenals/Urinary Tract: Adrenal thickening without focal mass. Adjacent calculi at inferior pole RIGHT kidney, larger 8 mm diameter. Small focus of gas identified within collecting system at inferior pole RIGHT kidney, uncertain etiology. Tiny nonobstructing calculi at inferior pole LEFT kidney. Vague area of low attenuation at upper pole LEFT kidney may represent a subtle cyst, unchanged. Kidneys otherwise unremarkable. No additional renal mass or hydronephrosis. Suprapubic catheter into decompressed urinary bladder. Stomach/Bowel: Normal appendix. Stomach decompressed. Scattered stool throughout colon, with significantly increased stool in rectum. Bowel loops otherwise normal appearance without dilatation, evidence of obstruction or wall thickening. Vascular/Lymphatic: Atherosclerotic calcifications aorta and iliac arteries without aneurysm. Reproductive: Uterus surgically absent. Question atrophic ovaries bilaterally. Other: No free air or free fluid.  No hernia. Musculoskeletal: Osseous demineralization. Loss of cortical margination of the inferior aspect of the LEFT ischium, with overlying decubitus track, suspicious for osteomyelitis. IMPRESSION: BILATERAL nonobstructing renal calculi. Small focus of gas within collecting system mid inferior pole RIGHT kidney of uncertain etiology and significance. Suspected small cyst at upper pole LEFT kidney. Markedly increased stool in rectum. Bone destruction/loss of cortical margination at inferior aspect of the LEFT ischium  suspicious for osteomyelitis. Fatty infiltration of liver. Questionable cholelithiasis. Electronically Signed   By: Lavonia Dana M.D.   On: 09/15/2018 17:15   Ct Head Wo Contrast  Result Date: 09/15/2018 CLINICAL DATA:  Altered level of consciousness. EXAM: CT HEAD WITHOUT CONTRAST TECHNIQUE: Contiguous axial images were obtained from the base  of the skull through the vertex without intravenous contrast. COMPARISON:  CT scan of Aug 10, 2015. FINDINGS: Brain: Minimal chronic ischemic white matter disease is noted. No mass effect or midline shift is noted. Ventricular size is within normal limits. There is no evidence of mass lesion, hemorrhage or acute infarction. Vascular: No hyperdense vessel or unexpected calcification. Skull: Normal. Negative for fracture or focal lesion. Sinuses/Orbits: No acute finding. Other: None. IMPRESSION: Minimal chronic ischemic white matter disease. No acute intracranial abnormality seen. Electronically Signed   By: Marijo Conception M.D.   On: 09/15/2018 13:34   Dg Chest Port 1 View  Result Date: 09/15/2018 CLINICAL DATA:  Fever, hypoxia and altered mental status. EXAM: PORTABLE CHEST 1 VIEW COMPARISON:  04/26/2017 FINDINGS: The heart is borderline enlarged but stable. The mediastinal and hilar contours are slightly prominent but unchanged. Low lung volumes with vascular crowding and atelectasis. Underlying chronic lung changes. No definite infiltrates or effusions. The bony thorax is intact. IMPRESSION: Low lung volumes with vascular crowding and atelectasis and underlying chronic lung changes but no definite acute infiltrates. Electronically Signed   By: Marijo Sanes M.D.   On: 09/15/2018 12:21    Pending Labs Unresulted Labs (From admission, onward)    Start     Ordered   09/16/18 0500  Protime-INR  Tomorrow morning,   R     09/15/18 1649   09/16/18 0500  Cortisol-am, blood  Tomorrow morning,   R     09/15/18 1649   09/16/18 0500  Procalcitonin  Tomorrow morning,   R      09/15/18 1649   09/15/18 1649  Urine culture  Once,   R     09/15/18 1649   09/15/18 1649  Lactic acid, plasma  STAT Now then every 3 hours,   STAT     09/15/18 1649   09/15/18 1644  HIV antibody (Routine Testing)  Once,   R     09/15/18 1649   09/15/18 1639  Lactic acid, plasma  Now then every 2 hours,   STAT     09/15/18 1638   09/15/18 1636  Blood gas, arterial  ONCE - STAT,   R    Question:  Room air or oxygen  Answer:  Oxygen   09/15/18 1635   09/15/18 1155  TSH  ONCE - STAT,   STAT     09/15/18 1154   09/15/18 1153  Lactic acid, plasma  Now then every 2 hours,   STAT     09/15/18 1153   09/15/18 1153  Blood Culture (routine x 2)  BLOOD CULTURE X 2,   STAT     09/15/18 1153   09/15/18 1153  Urine culture  ONCE - STAT,   STAT     09/15/18 1153          Vitals/Pain Today's Vitals   09/15/18 1700 09/15/18 1800 09/15/18 1814 09/15/18 1900  BP: 123/73 134/66  140/73  Pulse:    (!) 109  Resp: 13 18  17   Temp:      TempSrc:      SpO2:   98% 93%  Weight:      Height:      PainSc:        Isolation Precautions Droplet and Contact precautions  Medications Medications  vancomycin (VANCOCIN) 1,250 mg in sodium chloride 0.9 % 250 mL IVPB (has no administration in time range)  ceFEPIme (MAXIPIME) 2 g in sodium chloride 0.9 % 100 mL IVPB (  has no administration in time range)  heparin injection 5,000 Units (has no administration in time range)  sodium chloride 0.9 % bolus 1,000 mL (0 mLs Intravenous Stopped 09/15/18 1459)  ceFEPIme (MAXIPIME) 2 g in sodium chloride 0.9 % 100 mL IVPB (0 g Intravenous Stopped 09/15/18 1458)  metroNIDAZOLE (FLAGYL) IVPB 500 mg (0 mg Intravenous Stopped 09/15/18 1600)  vancomycin (VANCOCIN) 1,500 mg in sodium chloride 0.9 % 500 mL IVPB (0 mg Intravenous Stopped 09/15/18 1649)  sodium chloride 0.9 % bolus 1,000 mL (1,000 mLs Intravenous New Bag/Given 09/15/18 1559)    Mobility non-ambulatory     Focused Assessments Neuro Assessment  Handoff:  Swallow screen pass? No          Neuro Assessment:   Neuro Checks:      Last Documented NIHSS Modified Score:   Has TPA been given? No If patient is a Neuro Trauma and patient is going to OR before floor call report to [place] nurse: [phone] or [phone]     R Recommendations: See Admitting Provider Note  Report given to:   Additional Notes: Sepsis work up; call if you have questions

## 2018-09-15 NOTE — H&P (Addendum)
History and Physical  [name] RXV:[id number] DOB: [birth year] DOA: 09/15/2018  PCP: [name] Mariscal, MD   Chief Complaint: Elevated creatinine, fever, fatigue, lethargy, hypotension.  HPI:  The patient is a 68 yr old woman who was sent to the ED from home where she resides for the above chief complaints. She has a past medical history significant for DM II, Anemia, peripheral neuropathy, GERD, Hypertension, hypothyroidism, Paraplegia, Sacral decubitus ulcer, hyperlipidemia, and kidney stones. She has a suprapubic catheter due to neurogenic bladder.   In the ED she is lethargic, but arouseable. Upon arrival she was saturating 90% on room air. She was placed on supplemental O2. HR has been between 100 and 110. She was initially hypotensive at 82/51, but that has corrected with IV fluids.   UA appears to be positive for a UTI. ED stated that they would change out the catheter. CT abdomen and pelvis demonstrated a likely fecal impaction and scattered stool and gas throughout the colonic confines. It also demonstrated a likely osteomyelitis involving her left ischium that is related to a chronic sacral decubitus ulcer.  Creatinine is elevated at 1.44. Initial Lactic acid was 5.5, and has come down to 2.9 with IV fluids. Will continue to monitor. Blood cultures, urine cultures are pending. ABG revealed a mixed respiratory and metabolic acidosis. Supplementary O2 was reduced, and lactic acid will be followed and will continue to be followed. ABG will be rechecked. COVID-19 negative.  The hospitalist service has been consutled to admit the patient for further evaluation and treatment.   ED Course: The patient received IV fluids and was started on cefepime and vancomycin.   Review of Systems: Pt is unable to cooperate with review of systems due to her decreased level of awareness.   Past Medical History:  Diagnosis Date  . Anemia   . Arthritis    "all over" (04/13/2012)  . Cancer of right breast ([place])  2011  . Cervical neuropathy    PERIPHERAL NEUROPATHY , HAD CERVICAL FUSION  . Diabetes mellitus without complication ([place])   . Difficult intubation    Fastrach #4 LMA then # 7 ETT used in 2006 [facility], cervical laminectomy)  . GERD (gastroesophageal reflux disease)   . History of kidney stones   . Hypercholesteremia   . Hypertension   . Hypothyroidism   . Kidney stones 1990's   "twice; passed in hospital on my own" (04/13/2012)  . Kidney stones 08/10/2015  . Paraplegia ([place])   . PONV (postoperative nausea and vomiting)   . Sacral decubitus ulcer 10/2014    Past Surgical History:  Procedure Laterality Date  . ABDOMINAL HYSTERECTOMY  ~ 1976  . BACK SURGERY    . BREAST BIOPSY Right 2011  . BREAST LUMPECTOMY Right 2011  . CALDWELL LUC  ~ 2003   "benign tumor removed from up under gum" (04/13/2012)  . DACROCYSTORHINOSTOMY  ~ 2000   "put stents in my tear ducts; both eyes" (04/13/2012)  . EYE SURGERY  2004   STENTS TO BIL EYES  . I&D EXTREMITY Bilateral 11/03/2014   Procedure: IRRIGATION AND DEBRIDEMENT BILATERAL HEEL;  Surgeon: Irene Limbo, MD;  Location: [place];  Service: Plastics;  Laterality: Bilateral;  . INCISION AND DRAINAGE OF WOUND Left 11/03/2014   Procedure: IRRIGATION AND DEBRIDEMENT SACRAL ULCER;  Surgeon: Irene Limbo, MD;  Location: [place];  Service: Plastics;  Laterality: Left;  . JOINT REPLACEMENT    . POSTERIOR CERVICAL LAMINECTOMY  2006  . SKIN SPLIT GRAFT Left 08/05/2016  Procedure: SURGICAL PREP  FOR GRAFTING APPLICATION ACELL TO LEFT ISCHIUM;  Surgeon: Irene Limbo, MD;  Location: [place];  Service: Plastics;  Laterality: Left;  . TONSILLECTOMY AND ADENOIDECTOMY  ~ 1954  . TOTAL KNEE ARTHROPLASTY WITH REVISION COMPONENTS  04/13/2012   Procedure: TOTAL KNEE ARTHROPLASTY WITH REVISION COMPONENTS;  Surgeon: Hessie Dibble, MD;  Location: [place];  Service: Orthopedics;  Laterality: Left;     reports that she quit smoking about 3 years ago. Her smoking use  included cigarettes. She has a 25.00 pack-year smoking history. She has never used smokeless tobacco. She reports that she does not drink alcohol or use drugs. Mobility: Non-ambulatory  Allergies  Allergen Reactions  . Ivp Dye [Iodinated Diagnostic Agents] Other (See Comments)    "hot and sweaty and almost passed out"  . Aspirin Hives  . Sulfa Antibiotics Hives    Family History  Problem Relation Age of Onset  . Heart disease Father      Prior to Admission medications   Medication Sig Start Date End Date Taking? Authorizing Provider  ascorbic acid (VITAMIN C) 500 MG tablet Take 1 tablet (500 mg total) by mouth daily. 11/07/14  Yes Francesca Oman, DO  baclofen (LIORESAL) 20 MG tablet Take 20 mg by mouth every 6 (six) hours as needed for muscle spasms.    Yes [provider]  docusate sodium (COLACE) 100 MG capsule Take 100 mg by mouth 2 (two) times daily as needed (FOR CONSTIPATION).    Yes [provider]  DULoxetine (CYMBALTA) 60 MG capsule Take 120 mg by mouth daily.    Yes [provider]  ferrous sulfate 325 (65 FE) MG tablet Take 325 mg by mouth daily after supper.    Yes [provider]  folic acid (FOLVITE) 1 MG tablet Take 1 mg by mouth daily.    Yes [provider]  furosemide (LASIX) 40 MG tablet Take 40 mg by mouth daily.    Yes [provider]  levothyroxine (SYNTHROID, LEVOTHROID) 125 MCG tablet Take 1 tablet (125 mcg total) by mouth daily before breakfast. Patient taking differently: Take 125 mcg by mouth daily.  11/07/14  Yes Francesca Oman, DO  metFORMIN (GLUCOPHAGE) 500 MG tablet Take 250 mg by mouth 2 (two) times daily with a meal.   Yes [provider]  metoprolol (LOPRESSOR) 100 MG tablet Take 100 mg by mouth 2 (two) times daily.  10/28/14  Yes [provider]  Morphine Sulfate ER 30 MG T12A Take 30 mg by mouth 2 (two) times daily.   Yes [provider]  Multiple Vitamin (MULTIVITAMIN WITH  MINERALS) TABS tablet Take 1 tablet by mouth daily. 11/07/14  Yes Francesca Oman, DO  NIFEdipine (PROCARDIA XL/ADALAT-CC) 30 MG 24 hr tablet Take 30 mg by mouth daily after supper.    Yes [provider]  oxyCODONE-acetaminophen (PERCOCET) 10-325 MG tablet Take 1 tablet by mouth every 6 (six) hours as needed for pain.    Yes [provider]  pregabalin (LYRICA) 75 MG capsule Take 75 mg by mouth 2 (two) times daily.  08/23/18  Yes [provider]  senna (SENOKOT) 8.6 MG tablet Take 2 tablets by mouth at bedtime.   Yes [provider]  zinc sulfate 220 MG capsule Take 1 capsule (220 mg total) by mouth daily. 11/07/14  Yes Francesca Oman, DO  ciprofloxacin (CIPRO) 500 MG tablet Take 1 tablet (500 mg total) by mouth 2 (two) times daily. Patient  not taking: Reported on 09/15/2018 08/15/17   Isla Pence, MD    Physical Exam: Vitals:   09/15/18 1814 09/15/18 1900  BP:  140/73  Pulse:  (!) 109  Resp:  17  Temp:    SpO2: 98% 93%    Constitutional:   . The patient is lethargic, but arouseable with tactile and verbal stimulation. She is in no acute distress. Eyes:  . PERRLA, no scleral icterus or injection. Unable to evaluate EOM due to the patient's inability to coooperate with exam. . Normal lids and conjunctivae ENMT:  . grossly normal hearing  . Lips appear normal . external ears, nose appear normal . Oropharynx: Pt is unable to cooperate with this portion of exam. Neck:  . neck appears normal, no masses, normal ROM, supple . no thyromegaly Respiratory:  . No wheezes, rales, or rhonchi. No tactile fremitus. . No increased work of breathing. Cardiovascular:  . Regular rate and rhythm. No murmurs, ectopy, or gallups are appreciated. . No lateral PMI. No thrills. . No LE extremity edema   . Diminished pedal pulses bilaterally. Abdomen:  . Abdomen is distended and tense. No tenderness appreciated, but patient is lethargic.  . No hernias, masses or  organomegaly are appreciated.  Marland Kitchen Hypoactive bowel sounds.  Musculoskeletal:  . No cyanosis, clubbing or edema.  Skin:  . No rashes, lesions, ulcers . palpation of skin: no induration or nodules Neurologic:  . Unable to evaluate as the patient is unable to cooperate with examination. Psychiatric:  . Unable to evaluate as the patient is unable to cooperate with examination.   I have personally reviewed following labs and imaging studies  Labs:   CBC, CMP, Lactic Acid, ABG  Imaging studies:   CXR, CT Abdomen and pelvis  Medical tests:   EKG independently reviewed: Sinus tachycardia  Active Problems:   Sepsis due to urinary tract infection ([place])   Assessment/Plan Sepsis: This is based on elevated lactic acid of 5.5, tachycardia, fevers, hypotension. The patient has been placed on a sepsis protocol. Blood cultures x 2 have been drawn and urine has been sent for culture. Certainly her UTI plays a role in her sepsis, but her CT abdomen and pelvis has revealed a likely source of sepsis in her left ischium due to osteomyelitis. She has been started on Cefepime and Vancomycin. ID should be consulted in the am.   Combined respiratory/metabolic acidosis: Due to high FIO2 the patient is receiving and a PaO2 of 84 resulting in a PCO2 of 57.0. Also due to lactic acid of 5.5 upon arrival. This has been addressed with IV antibiotics and IV fluids. Monitor.   UTI: UA appears positive for UTI, but the patient does have a chronic suprapubic catheter due to neurogenic bladder. This has been exchanged in the ED. Urine culture is pending.   Osteomyelitis Left Ischium: Due to chronic sacral decubitus ulcer. The patient has had cefepime and vancomycin initiated. ID consult in the am would be helpful. Wound care will be consulted to address her decubitus ulcer.  DM II: The patient's most recent HbA1c was more than 2 years ago. I have ordered another one. She uses only metformin for her DM II. 2 years  ago her HbA1c was 11.7 on 04/18/2016. Her glucoses will be followed with FSBS and SSI.   Decreased level of consciousness: Multifactorial including combined respiratory and metabolic acidosis, Multiple narcotic pain medications, and elevated creatinine. I have asked nursing to turn down her O2 and to aim for SaO2  around 90. She is receiving IV fluids as part of the sepsis protocol. Her lactic acid has decreased from 5.5 to 2.9. It will continue to be followed.   Sacral decubitus ulcer: Due to paraplegia. Wound care will be consulted to address. This is the source of possible osteomyelitis. Pt is receiving cefepime and vancomycin. Blood cultures and urine cultures are pending. This has been addressed by plastic surgery at [facility] in 2018.  Paraplegia: At the base or many of the patient's issues: sacral decubitus ulcer, UTI due to suprapubic catheter, polypharmacy due to patient's chronic pain issues and spasticity contributing to her decreased level of consciousness.  Fecal impation: Soap suds enema.  I have seen and examined this patient myself. I have spent 78 minutes in her evaluation and care.  ELOS: tbd CODE STATUS: Full Code PCP: [name] Mariscal, MD DVT Prophylaxis: Heparin    Severity of Illness: The appropriate patient status for this patient is INPATIENT. Inpatient status is judged to be reasonable and necessary in order to provide the required intensity of service to ensure the patient's safety. The patient's presenting symptoms, physical exam findings, and initial radiographic and laboratory data in the context of their chronic comorbidities is felt to place them at high risk for further clinical deterioration. Furthermore, it is not anticipated that the patient will be medically stable for discharge from the hospital within 2 midnights of admission. The following factors support the patient status of inpatient.   " The patient's presenting symptoms include Fever, altered mental status. " The  worrisome physical exam findings include Hypotension, tachycardia, fever, decreased level of consciousness. " The initial radiographic and laboratory data are worrisome because of suggestion of osteomyelitis on CT Abdomen and pelvis, likely fecal impaction, Lactic acid of 5.5. ABG with combined respiratory and metabolic acidosis. " The chronic co-morbidities include Paraplegia, chronic pain, chronic sacral decubitus ulcer, neurogenic bladder, suprapubic catheter, DM II.   * I certify that at the point of admission it is my clinical judgment that the patient will require inpatient hospital care spanning beyond 2 midnights from the point of admission due to high intensity of service, high risk for further deterioration and high frequency of surveillance required.*  Time spent: 78 minutes  [name] Liggins, DO [facility] Direct contact: see [email or web address]  7PM-7AM contact night coverage as above  09/15/2018, 7:06 PM

## 2018-09-15 NOTE — ED Provider Notes (Signed)
  Physical Exam  BP 123/73   Pulse (!) 102   Temp 99.8 F (37.7 C) (Rectal)   Resp 13   Ht 5\' 6"  (1.676 m)   Wt 90.7 kg   SpO2 100%   BMI 32.28 kg/m   Physical Exam  ED Course/Procedures     ASPIRATION BLADDER INSERT SUPRAPUBIC CATHETER Date/Time: 09/15/2018 6:09 PM Performed by: Alveria Apley, PA-C Authorized by: Alveria Apley, PA-C  Consent: Verbal consent obtained. Consent given by: patient Patient identity confirmed: verbally with patient Preparation: Patient was prepped and draped in the usual sterile fashion. Local anesthesia used: no  Anesthesia: Local anesthesia used: no  Sedation: Patient sedated: no  Patient tolerance: Patient tolerated the procedure well with no immediate complications     MDM   Previous catheter removed with ease, 18 fr suprapubic catheter inserted without resistance.       Alveria Apley, PA-C 09/15/18 1811    Varney Biles, MD 09/15/18 Pauline Aus

## 2018-09-16 ENCOUNTER — Inpatient Hospital Stay (HOSPITAL_COMMUNITY): Payer: PPO

## 2018-09-16 DIAGNOSIS — G8220 Paraplegia, unspecified: Secondary | ICD-10-CM

## 2018-09-16 DIAGNOSIS — Z87442 Personal history of urinary calculi: Secondary | ICD-10-CM

## 2018-09-16 DIAGNOSIS — R7881 Bacteremia: Secondary | ICD-10-CM

## 2018-09-16 DIAGNOSIS — I5030 Unspecified diastolic (congestive) heart failure: Secondary | ICD-10-CM

## 2018-09-16 DIAGNOSIS — I10 Essential (primary) hypertension: Secondary | ICD-10-CM

## 2018-09-16 DIAGNOSIS — E785 Hyperlipidemia, unspecified: Secondary | ICD-10-CM

## 2018-09-16 DIAGNOSIS — N319 Neuromuscular dysfunction of bladder, unspecified: Secondary | ICD-10-CM

## 2018-09-16 DIAGNOSIS — Z881 Allergy status to other antibiotic agents status: Secondary | ICD-10-CM

## 2018-09-16 DIAGNOSIS — B961 Klebsiella pneumoniae [K. pneumoniae] as the cause of diseases classified elsewhere: Secondary | ICD-10-CM

## 2018-09-16 DIAGNOSIS — L89329 Pressure ulcer of left buttock, unspecified stage: Secondary | ICD-10-CM | POA: Insufficient documentation

## 2018-09-16 DIAGNOSIS — K219 Gastro-esophageal reflux disease without esophagitis: Secondary | ICD-10-CM

## 2018-09-16 DIAGNOSIS — E039 Hypothyroidism, unspecified: Secondary | ICD-10-CM

## 2018-09-16 DIAGNOSIS — K5900 Constipation, unspecified: Secondary | ICD-10-CM

## 2018-09-16 DIAGNOSIS — D649 Anemia, unspecified: Secondary | ICD-10-CM

## 2018-09-16 DIAGNOSIS — Z886 Allergy status to analgesic agent status: Secondary | ICD-10-CM

## 2018-09-16 DIAGNOSIS — Z87891 Personal history of nicotine dependence: Secondary | ICD-10-CM

## 2018-09-16 DIAGNOSIS — L89159 Pressure ulcer of sacral region, unspecified stage: Secondary | ICD-10-CM

## 2018-09-16 DIAGNOSIS — E1142 Type 2 diabetes mellitus with diabetic polyneuropathy: Secondary | ICD-10-CM

## 2018-09-16 DIAGNOSIS — E1165 Type 2 diabetes mellitus with hyperglycemia: Secondary | ICD-10-CM

## 2018-09-16 DIAGNOSIS — Z960 Presence of urogenital implants: Secondary | ICD-10-CM

## 2018-09-16 DIAGNOSIS — Z91041 Radiographic dye allergy status: Secondary | ICD-10-CM

## 2018-09-16 LAB — BLOOD CULTURE ID PANEL (REFLEXED)

## 2018-09-16 LAB — CORTISOL-AM, BLOOD: Cortisol - AM: 18.3 ug/dL (ref 6.7–22.6)

## 2018-09-16 LAB — PROTIME-INR
INR: 1.2 (ref 0.8–1.2)
Prothrombin Time: 15 seconds (ref 11.4–15.2)

## 2018-09-16 LAB — ECHOCARDIOGRAM COMPLETE
Height: 62 in
Weight: 3530.89 oz

## 2018-09-16 LAB — GLUCOSE, CAPILLARY
Glucose-Capillary: 222 mg/dL — ABNORMAL HIGH (ref 70–99)
Glucose-Capillary: 229 mg/dL — ABNORMAL HIGH (ref 70–99)
Glucose-Capillary: 192 mg/dL — ABNORMAL HIGH (ref 70–99)
Glucose-Capillary: 210 mg/dL — ABNORMAL HIGH (ref 70–99)

## 2018-09-16 LAB — PROCALCITONIN: Procalcitonin: 18.54 ng/mL

## 2018-09-16 LAB — MRSA PCR SCREENING: MRSA by PCR: POSITIVE — AB

## 2018-09-16 LAB — HIV ANTIBODY (ROUTINE TESTING W REFLEX): HIV Screen 4th Generation wRfx: NONREACTIVE

## 2018-09-16 MED ORDER — SODIUM CHLORIDE 0.9 % IV SOLN
2.0000 g | INTRAVENOUS | Status: AC
  Administered 2018-09-16 – 2018-09-21 (×6): 2 g via INTRAVENOUS
  Filled 2018-09-16 (×6): qty 20

## 2018-09-16 MED ORDER — POLYETHYLENE GLYCOL 3350 17 G PO PACK
17.0000 g | PACK | Freq: Two times a day (BID) | ORAL | Status: DC
  Administered 2018-09-16 – 2018-09-21 (×7): 17 g via ORAL
  Filled 2018-09-16 (×13): qty 1

## 2018-09-16 MED ORDER — MAGNESIUM SULFATE 2 GM/50ML IV SOLN
2.0000 g | Freq: Once | INTRAVENOUS | Status: AC
  Administered 2018-09-16: 2 g via INTRAVENOUS
  Filled 2018-09-16: qty 50

## 2018-09-16 MED ORDER — MUPIROCIN 2 % EX OINT
1.0000 "application " | TOPICAL_OINTMENT | Freq: Two times a day (BID) | CUTANEOUS | Status: AC
  Administered 2018-09-16 – 2018-09-20 (×9): 1 via NASAL
  Filled 2018-09-16 (×2): qty 22

## 2018-09-16 MED ORDER — CHLORHEXIDINE GLUCONATE CLOTH 2 % EX PADS
6.0000 | MEDICATED_PAD | Freq: Every day | CUTANEOUS | Status: AC
  Administered 2018-09-16 – 2018-09-20 (×4): 6 via TOPICAL

## 2018-09-16 MED ORDER — INSULIN GLARGINE 100 UNIT/ML ~~LOC~~ SOLN
10.0000 [IU] | Freq: Every day | SUBCUTANEOUS | Status: DC
  Administered 2018-09-16: 10 [IU] via SUBCUTANEOUS
  Filled 2018-09-16 (×3): qty 0.1

## 2018-09-16 NOTE — Consult Note (Signed)
[place] Nurse wound consult note Patient receiving care in [facility] [phone]. At the current time, the FYIS box contains the following statement:  "POSSIBLE NOVEL CORONAVIRUS (2019-NCOV)". Reason for Consult: "sacral decubitus ulcer" Wound type: Infectious.  Per note of Dr. Domenica Reamer from 09/15/18 at 8:12 p.m., the patient has osteomyelitis of the left ischium.  Infectious disease is being consulted. Pressure Injury POA: Yes Measurement, Wound bed, Drainage (amount, consistency, odor),Periwound: to be provided by bedside RN into the flowsheet. Dressing procedure/placement/frequency: Orders for the primary RN to measure and record these and wound characteristics into the flowsheet, to notify the MD if the wound has necrotic tissue/foul odor/signs of infection, and topical therapy of Aquacel Ag+ Kellie Simmering [phone]) to site covered by foam dressing have been entered.  Due to the nature of this patient's suspected COVID-19 and in keeping with efforts to prevent the spread of infection and to conserve personal protective equipment, a physical exam was not personally performed.  A chart review of other providers notes and the patient's lab work as well as review of other pertinent studies was performed.  Exam details from prior documentation were reviewed.  Photos are not available in the EMR.  Monitor the wound area(s) for worsening of condition such as: Signs/symptoms of infection,  Increase in size,  Development of or worsening of odor, Development of pain, or increased pain at the affected locations.  Notify the medical team if any of these develop.  Thank you for the consult. [place] nurse will not follow at this time.  Please re-consult the [place] team if needed.  Val Riles, RN, MSN, CWOCN, CNS-BC, pager [phone]

## 2018-09-16 NOTE — Progress Notes (Signed)
During initial shift assessment and 2200 med administration patient A&Ox4. Patient answered room phone to speak with family. Patient indicated did not want door closed.   During 2300 med administration, patient expressed frustration stating RN had taped her and taken it around the unit. Upon clarification informed patient was typing notes on the computer based on her assessment. RN administered oral meds and expressed concern about insulin dosage being greater than 2 units, stated prior RN told her insulin amount needed to be monitored. Informed patient received 2 units of Novolog, but Lantus is night coverage to cover her CBG overnight since receive no more insulin until AM. Patient expressed dissatisfaction and confusion with information provided. Oral meds given one at a time (2 tablets total), put tablets in patient's mouth. Patient swallowed pills one at a time with no coughing. Patient expressed dissatisfcation with communication between RN and doctor when admitted to unit, however, unable to clarify what the reason for agitation was.   Patient SOB while speaking. Inquired if patient SOB.  Patient stated upset and needed to calm down. Per continuous pulse ox, SpO2 dropped from low/mid 90's to 88-89%. 3L applied HOB raised @ 2340. Patient complained of not being able to breathe. Informed patient need to re-apply oxygen nasal cannual as O2 drop from low 90's to high 80's. Called in staff to assist with elevating pattient in bed. Patient swatting away nasal cannula. Non-rebreather applied at 10L and O2 drop to high to mid 70's. RN and NT remain at bedside.  Rapid RN Mindy called. .   Patient not responsive; not following commands, not responsive to sternal rubs. BP elevated and O2 increased to 88 upon non-rebreather with 13L. Rapid at bedside. Paged NP Schorr at [phone]. NP Schorr return call at [place]; stated on way to bedside. Verbal order for Lasix.   ABG, Bipap and chest x-ray ordered. IV lasix ordered.    919mL in urine bag when lasix given at 0051.  Restraints applied at 0035. NP Schorr at bedside.   Patient began to respond to commands, track with eyes and became agitated. Wrist restraints applied per NP verbal order. Patient trying to remove Bipap, resisting efforts to be calmed. When inquired patient stated hurt everywhere. Repeatedly stated "take this off of me." Patient stated "why torturing me", screaming "help." RN remained at bedside and attempted to calm patient. Comforted patient by explaing Bipap applied due to low oxygen and needed to keep mask on. Informed patient she was not alone, that she was being closely monitored and not safe to take off bipap at this time. Patient stated she was hot and was soaking wet, towel applied to back, loosened gown and bedside fan near patient.   Observed patient calm, resting at 0150.  Spoke with patient during reassessment 0219. Patient calm, resting and oriented x4. Patient stated all of a sudden could not breathe and her rib cage on right and left were hurting and felt miserable. Inquired if remembered anything about more staff in room, patient not able to recall. Stated shefelt better. Educated on purpose of bipap and why applied (low O2). Patient confirmed understanding.   Spoke with NP Schorr via phone at 0240, provided ABG results, informed to follow up with RT about FiO2 to increase PO2. Remain on Bipap. Will continue to monitor.  Called Tim RT at [phone] and inquired per NP if FiO2 could be adjusted to better PO2. RT stated will attempt to adjust settings.   Restraints removed at [place]. Patient calm and  resting. Urine output approx 1173mL:  Upon reassessment at 0444 patient stated the noise in room and mask giving her a headache. Educated patient on purpose of needing mask to remain on to increase oxygen level. Pt. Inquired about oxygen level informed 95%, but need to verify O2 in blood at ok level before remove Bipap.

## 2018-09-16 NOTE — Progress Notes (Signed)
Patient arrived to the unit via bed from the emergency department.  Patient is alert and oriented x 4.  complaints of abdominal discomfort.  Skin assessment complete.  Preesure ulcer noted  to the left ischial.  Suprapubic catheter noted.  Blister on the left ankle, foam applied.  Elevated the patient heels.  Educated the patient on how to reach the staff on the unit.  Lowered he bed and placed the call light within reach.  Will continue to monitor the patient and noitfy as needed.

## 2018-09-16 NOTE — Progress Notes (Signed)
Inpatient Diabetes Program Recommendations  AACE/ADA: New Consensus Statement on Inpatient Glycemic Control (2015)  Target Ranges:  Prepandial:   less than 140 mg/dL      Peak postprandial:   less than 180 mg/dL (1-2 hours)      Critically ill patients:  140 - 180 mg/dL   Lab Results  Component Value Date   GLUCAP 222 (H) 09/16/2018   HGBA1C 10.0 (H) 09/15/2018    Review of Glycemic Control  Diabetes history: DM 2 Outpatient Diabetes medications: Metformin 250 mg BID Current orders for Inpatient glycemic control: Novolog 0-15 tid started this am  A1c 10%  Spoke with husband over the phone. Husband reports helping patient with her medications at home. He checks her glucose once a day. According to their PCP 1-2 months ago patient's last A1c was around a 9%. Husband thought this level was acceptable.  Discussed current A1c of 10%, discussed glucose and A1c goals. Husband does not want to do insulin at home at this time. I strongly encouraged insulin at home for glucose control in the setting of acute and chronic infections and wound healing. Husband is wanting to make a f/u appointment with patient's PCP ASAP after hospitalization and have the insulin discussion with them.  Patient will at least need an increase in Metformin at time of d/c. Patient will ultimately benefit from insulin outpatient, however husband wants to double check with PCP.   Thanks, Tama Headings RN, MSN, BC-ADM Inpatient Diabetes Coordinator Team Pager [phone] (8a-5p)

## 2018-09-16 NOTE — Progress Notes (Signed)
PHARMACY - PHYSICIAN COMMUNICATION CRITICAL VALUE ALERT - BLOOD CULTURE IDENTIFICATION (BCID)  [name] is an 68 y.o. female who presented to [facility] on 09/15/2018 with a chief complaint of possible pelvic osteomyelitis  Assessment:   2 sets of blood cultures taken 6/10 2/4 kleb pneumo KPC not detected   Name of physician (or Provider) Contacted: Dr. Darrick Meigs  Current antibiotics: vancomycin and cefepime  Changes to prescribed antibiotics recommended per ID:  Switch cefepime to ceftriaxone, continue vancomycin   Recommendations accepted by provider  Results for orders placed or performed during the hospital encounter of 09/15/18  Blood Culture ID Panel (Reflexed) (Collected: 09/15/2018 12:36 PM)  Result Value Ref Range   Enterococcus species NOT DETECTED NOT DETECTED   Listeria monocytogenes NOT DETECTED NOT DETECTED   Staphylococcus species NOT DETECTED NOT DETECTED   Staphylococcus aureus (BCID) NOT DETECTED NOT DETECTED   Streptococcus species NOT DETECTED NOT DETECTED   Streptococcus agalactiae NOT DETECTED NOT DETECTED   Streptococcus pneumoniae NOT DETECTED NOT DETECTED   Streptococcus pyogenes NOT DETECTED NOT DETECTED   Acinetobacter baumannii NOT DETECTED NOT DETECTED   Enterobacteriaceae species DETECTED (A) NOT DETECTED   Enterobacter cloacae complex NOT DETECTED NOT DETECTED   Escherichia coli NOT DETECTED NOT DETECTED   Klebsiella oxytoca NOT DETECTED NOT DETECTED   Klebsiella pneumoniae DETECTED (A) NOT DETECTED   Proteus species NOT DETECTED NOT DETECTED   Serratia marcescens NOT DETECTED NOT DETECTED   Carbapenem resistance NOT DETECTED NOT DETECTED   Haemophilus influenzae NOT DETECTED NOT DETECTED   Neisseria meningitidis NOT DETECTED NOT DETECTED   Pseudomonas aeruginosa NOT DETECTED NOT DETECTED   Candida albicans NOT DETECTED NOT DETECTED   Candida glabrata NOT DETECTED NOT DETECTED   Candida krusei NOT DETECTED NOT DETECTED   Candida  parapsilosis NOT DETECTED NOT DETECTED   Candida tropicalis NOT DETECTED NOT DETECTED    Vertis Kelch, PharmD PGY1 Pharmacy Resident Phone [phone] 09/16/2018       11:20 AM

## 2018-09-16 NOTE — Consult Note (Signed)
[facility] for Infectious Disease    Date of Admission:  09/15/2018     Total days of antibiotics 1  Vancomycin  Cefepime                 Reason for Consult: ?sacral osteomyelitis     Referring Provider: [place] Primary Care Provider: [name], MD    Assessment: [name] is a 68 y.o. female admitted with fevers, lethargy last night and found to have gram negative rod bacteremia with early diagnostics indicating klebsiella pneumoniae. CT scan done for abdominal pain with finding of possible osteomyelitis in the area of a chronic left ischial decubitus. Her wound tracks about 3 centimeters at the deepest point.  I do not probe any bone.  No purulence, malodor noted. Very small thin drainage. She says that this wound has been healing nicely in discussions with her husband and wound care team.  She was treated for previous osteomyelitis when this wound was larger in 2017.  Given that her wound is improving and there is no significant findings for soft tissue infection at this point I do not think this is contributing to her current acute illness.  She has gram-negative rod bacteremia is most likely associated with urinary tract infection given her tender suprapubic/lower abdominal pain and abrupt onset illness.  We will stop vancomycin and treat with ceftriaxone and follow susceptibilities tomorrow from blood culture isolate.  We will likely be able to narrow to oral therapy after she has had more time to improve.  Should she have a halt in her wound healing or signs of worsening drainage or infectious symptoms of the wound would recommend outpatient MRI with and without of the pelvis to better characterize.   The wound on her left lateral malleolus is clean.  It is evident this was previously a blister that ruptured.  May be helpful to have Tegaderm versus Steri-Strips to keep skin flap in place to re-epithelialize.  Would defer to wound care team however for final  recs.   Plan: 1. Stop vancomycin. 2. Start ceftriaxone 2 g once a day 3. Follow sensitivities and micro data 4. Agree with air mattress overlay and offloading with turns every 2 hours   Principal Problem:   Bacteremia due to Klebsiella pneumoniae Active Problems:   Diabetes type 2, uncontrolled (HCC)   Sepsis due to urinary tract infection (HCC)   Pressure injury of skin    Chlorhexidine Gluconate Cloth  6 each Topical Q0600   DULoxetine  120 mg Oral Daily   ferrous sulfate  325 mg Oral QPC supper   folic acid  1 mg Oral Daily   furosemide  40 mg Oral Daily   heparin  5,000 Units Subcutaneous Q8H   insulin aspart  0-15 Units Subcutaneous TID WC   levothyroxine  125 mcg Oral QAC breakfast   metoprolol tartrate  100 mg Oral BID   morphine  30 mg Oral BID   multivitamin with minerals  1 tablet Oral Daily   mupirocin ointment  1 application Nasal BID   NIFEdipine  30 mg Oral QPC supper   polyethylene glycol  17 g Oral BID   pregabalin  75 mg Oral BID   senna  2 tablet Oral QHS   ascorbic acid  500 mg Oral Daily   zinc sulfate  220 mg Oral Daily    HPI: [name] is a 68 y.o. female admitted to [facility] from home  yesterday 09/15/18 for evaluation of fever, fatigue, lethargy, hypotension and AKI.   PMHx - DM II, Anemia, peripheral neuropathy, GERD, Hypertension, hypothyroidism, Paraplegia, Sacral decubitus ulcer, hyperlipidemia, kidney stones, suprapubic catheter 2/2 neurogenic bladder.   She is frustrated with the delays in care currently and talks a lot about that.  She resides at home with her husband who cares for her 24/7. She says that this wound on her buttocks has been present for 3 years but has healed in significantly. She says she used to have multiple wounds in the past but with wound care and offloading she has reduced the size significantly. Previously working with Clinical cytogeneticist but never underwent flap closure. Has used negative  pressure wound therapy in the past as well but not currently. She says that there has been no drainage at this site from her husband's report lately. Has required antibiotics in the past through IV (saw Dr. Linus Salmons in 2017 tx with vanc and ceftriaxone). Most of her complaints are surrounding her abdominal pain. She has a problem with constipation and sometimes does not pass a BM for 1-2 weeks at a time. She has a chronic suprapubic catheter and usually notes abdominal pain when she gets a UTI (which she has frequently per her report.) She says it is hard to tell "what is what now" since the pain from enema and is in addition quite tender around her anus. She has been on a bowel regimen outpatient with combination of colace/dulcolax which is helpful for her. She did not notice any fevers at home but did report frequent episodes of temperature fluctuation and sweating that she describes to have been an abrupt onset.   In the ER she was lethargic with borderline hypoxia (RA sats 90%), tachycardia 110s and hypotensive with SBPs in 80s that quickly was corrected with IV hydration. Infectious work up thus far has revealed mild temp elevation 100.1 F, mild leukocytosis 11.4K, CT scan of the abdomen that identified likely fecal impaction as well as possible osteomyelitis of the left ischium contiguous to a chronic sacral ulcer. She was started on IV cefepime, metronidazole and vancomycin. Now found to have GNR bacteremia with Klebsiella pneumoniae in both sets of blood cultures.   COVID-19 negative. MRSA PCR +  Review of Systems: Review of Systems  Constitutional: Positive for diaphoresis and malaise/fatigue.  Respiratory: Negative for cough.   Cardiovascular: Negative for chest pain and leg swelling.  Gastrointestinal: Positive for abdominal pain and constipation. Negative for diarrhea and nausea.  Genitourinary:       Suprapubic and lower abdominal pain   Musculoskeletal: Negative for back pain and joint  pain.  Skin: Negative for rash.       Left ischial decub, left lateral malleolar blister    Past Medical History:  Diagnosis Date   Anemia    Arthritis    "all over" (04/13/2012)   Cancer of right breast ([place]) 2011   Cervical neuropathy    PERIPHERAL NEUROPATHY , HAD CERVICAL FUSION   Diabetes mellitus without complication ([place])    Difficult intubation    Fastrach #4 LMA then # 7 ETT used in 2006 [facility], cervical laminectomy)   GERD (gastroesophageal reflux disease)    History of kidney stones    Hypercholesteremia    Hypertension    Hypothyroidism    Kidney stones 1990's   "twice; passed in hospital on my own" (04/13/2012)   Kidney stones 08/10/2015   Paraplegia (HCC)    PONV (postoperative nausea and vomiting)  Sacral decubitus ulcer 10/2014    Social History   Tobacco Use   Smoking status: Former Smoker    Packs/day: 0.50    Years: 50.00    Pack years: 25.00    Types: Cigarettes    Quit date: 11/06/2014    Years since quitting: 3.8   Smokeless tobacco: Never Used  Substance Use Topics   Alcohol use: No   Drug use: No    Family History  Problem Relation Age of Onset   Heart disease Father    Allergies  Allergen Reactions   Ivp Dye [Iodinated Diagnostic Agents] Other (See Comments)    "hot and sweaty and almost passed out"   Aspirin Hives   Sulfa Antibiotics Hives    OBJECTIVE: Blood pressure (!) 159/72, pulse (!) 108, temperature 98.3 F (36.8 C), temperature source Oral, resp. rate 15, height 5\' 2"  (1.575 m), weight 100.1 kg, SpO2 96 %.  Physical Exam Constitutional:      Comments: Lying in bed.  Frustrated.  No distress.  HENT:     Mouth/Throat:     Mouth: Mucous membranes are moist.     Pharynx: Oropharynx is clear.  Eyes:     General: No scleral icterus.    Pupils: Pupils are equal, round, and reactive to light.  Cardiovascular:     Rate and Rhythm: Normal rate and regular rhythm.     Pulses: Normal pulses.      Heart sounds: No murmur.  Abdominal:     Tenderness: There is abdominal tenderness (lower central abdomen signifanctly TTP).  Genitourinary:    Comments: Suprapubic catheter in place.  Insertion site clean dry dressing no drainage. Musculoskeletal:     Right lower leg: No edema.     Left lower leg: No edema.  Skin:    General: Skin is warm and dry.     Capillary Refill: Capillary refill takes less than 2 seconds.     Comments: Left ischial tuberosity sacral decubitus. Hypopigmented/scarred tissue present with previous healing and other wounds. Difficult to see measurements but seems to tunnel at deepest 3 cm. Small tan thin drainage noted on gauze. No purulence or malodor.   Left lateral malleolus with previous blister that has ruptured.  No drainage discharge or foul odor.  Periwound is intact and nonerythematous or edematous.  Now appears to be similar to a skin tear with overlapping skin.  Allevyn dressing in place.  Neurological:     Mental Status: She is oriented to person, place, and time.  Psychiatric:        Mood and Affect: Mood normal.        Behavior: Behavior normal.     Lab Results Lab Results  Component Value Date   WBC 11.4 (H) 09/15/2018   HGB 12.2 09/15/2018   HCT 36.0 09/15/2018   MCV 98.0 09/15/2018   PLT 156 09/15/2018    Lab Results  Component Value Date   CREATININE 1.23 (H) 09/15/2018   BUN 30 (H) 09/15/2018   NA 140 09/15/2018   K 4.6 09/15/2018   CL 100 09/15/2018   CO2 23 09/15/2018    Lab Results  Component Value Date   ALT 44 09/15/2018   AST 51 (H) 09/15/2018   ALKPHOS 59 09/15/2018   BILITOT 0.9 09/15/2018     Microbiology: Recent Results (from the past 240 hour(s))  Urine culture     Status: Abnormal (Preliminary result)   Collection Time: 09/15/18 11:53 AM   Specimen: Urine, Random  Result Value Ref Range Status   Specimen Description URINE, RANDOM  Final   Special Requests NONE  Final   Culture (A)  Final    >=100,000  COLONIES/mL GRAM NEGATIVE RODS CULTURE REINCUBATED FOR BETTER GROWTH Performed at [facility], [address] [place], [place] [address]    Report Status PENDING  Incomplete  SARS Coronavirus 2 (CEPHEID- Performed in [facility] hospital lab), Hosp Order     Status: None   Collection Time: 09/15/18 11:54 AM   Specimen: Nasopharyngeal Swab  Result Value Ref Range Status   SARS Coronavirus 2 NEGATIVE NEGATIVE Final    Comment: (NOTE) If result is NEGATIVE SARS-CoV-2 target nucleic acids are NOT DETECTED. The SARS-CoV-2 RNA is generally detectable in upper and lower  respiratory specimens during the acute phase of infection. The lowest  concentration of SARS-CoV-2 viral copies this assay can detect is 250  copies / mL. A negative result does not preclude SARS-CoV-2 infection  and should not be used as the sole basis for treatment or other  patient management decisions.  A negative result may occur with  improper specimen collection / handling, submission of specimen other  than nasopharyngeal swab, presence of viral mutation(s) within the  areas targeted by this assay, and inadequate number of viral copies  (<250 copies / mL). A negative result must be combined with clinical  observations, patient history, and epidemiological information. If result is POSITIVE SARS-CoV-2 target nucleic acids are DETECTED. The SARS-CoV-2 RNA is generally detectable in upper and lower  respiratory specimens dur ing the acute phase of infection.  Positive  results are indicative of active infection with SARS-CoV-2.  Clinical  correlation with patient history and other diagnostic information is  necessary to determine patient infection status.  Positive results do  not rule out bacterial infection or co-infection with other viruses. If result is PRESUMPTIVE POSTIVE SARS-CoV-2 nucleic acids MAY BE PRESENT.   A presumptive positive result was obtained on the submitted specimen  and confirmed on  repeat testing.  While 2019 novel coronavirus  (SARS-CoV-2) nucleic acids may be present in the submitted sample  additional confirmatory testing may be necessary for epidemiological  and / or clinical management purposes  to differentiate between  SARS-CoV-2 and other Sarbecovirus currently known to infect humans.  If clinically indicated additional testing with an alternate test  methodology [phone]) is advised. The SARS-CoV-2 RNA is generally  detectable in upper and lower respiratory sp ecimens during the acute  phase of infection. The expected result is Negative. Fact Sheet for Patients:  StrictlyIdeas.no Fact Sheet for Healthcare Providers: BankingDealers.co.za This test is not yet approved or cleared by the [place] FDA and has been authorized for detection and/or diagnosis of SARS-CoV-2 by FDA under an Emergency Use Authorization (EUA).  This EUA will remain in effect (meaning this test can be used) for the duration of the COVID-19 declaration under Section 564(b)(1) of the Act, 21 U.S.C. section 360bbb-3(b)(1), unless the authorization is terminated or revoked sooner. Performed at [facility], [place] [address], [place], [place] [address]   Blood Culture (routine x 2)     Status: None (Preliminary result)   Collection Time: 09/15/18 11:58 AM   Specimen: BLOOD  Result Value Ref Range Status   Specimen Description BLOOD BLOOD RIGHT FOREARM  Final   Special Requests   Final    BOTTLES DRAWN AEROBIC AND ANAEROBIC Blood Culture adequate volume   Culture  Setup Time   Final  GRAM NEGATIVE RODS AEROBIC BOTTLE ONLY CRITICAL VALUE NOTED.  VALUE IS CONSISTENT WITH PREVIOUSLY REPORTED AND CALLED VALUE. Performed at [facility], [place] [address] [place], [place] [address]    Culture GRAM NEGATIVE RODS  Final   Report Status PENDING  Incomplete  Blood Culture (routine x 2)     Status: None (Preliminary result)    Collection Time: 09/15/18 12:36 PM   Specimen: BLOOD  Result Value Ref Range Status   Specimen Description BLOOD SITE NOT SPECIFIED  Final   Special Requests   Final    BOTTLES DRAWN AEROBIC AND ANAEROBIC Blood Culture results may not be optimal due to an inadequate volume of blood received in culture bottles   Culture  Setup Time   Final    IN BOTH AEROBIC AND ANAEROBIC BOTTLES Organism ID to follow GRAM NEGATIVE RODS CRITICAL RESULT CALLED TO, READ BACK BY AND VERIFIED WITH: MCCARTHY PHARMD AT 0800 ON [id number] BY SJW Performed at [facility], [place] [address], [place], [place] [address]    Culture GRAM NEGATIVE RODS  Final   Report Status PENDING  Incomplete  Blood Culture ID Panel (Reflexed)     Status: Abnormal   Collection Time: 09/15/18 12:36 PM  Result Value Ref Range Status   Enterococcus species NOT DETECTED NOT DETECTED Final   Listeria monocytogenes NOT DETECTED NOT DETECTED Final   Staphylococcus species NOT DETECTED NOT DETECTED Final   Staphylococcus aureus (BCID) NOT DETECTED NOT DETECTED Final   Streptococcus species NOT DETECTED NOT DETECTED Final   Streptococcus agalactiae NOT DETECTED NOT DETECTED Final   Streptococcus pneumoniae NOT DETECTED NOT DETECTED Final   Streptococcus pyogenes NOT DETECTED NOT DETECTED Final   Acinetobacter baumannii NOT DETECTED NOT DETECTED Final   Enterobacteriaceae species DETECTED (A) NOT DETECTED Final    Comment: Enterobacteriaceae represent a large family of gram-negative bacteria, not a single organism. CRITICAL RESULT CALLED TO, READ BACK BY AND VERIFIED WITH: MCCARTHY PHARMD AT 0800 ON [id number] BY SJW    Enterobacter cloacae complex NOT DETECTED NOT DETECTED Final   Escherichia coli NOT DETECTED NOT DETECTED Final   Klebsiella oxytoca NOT DETECTED NOT DETECTED Final   Klebsiella pneumoniae DETECTED (A) NOT DETECTED Final    Comment: CRITICAL RESULT CALLED TO, READ BACK BY AND VERIFIED WITH: MCCARTHY PHARMD AT 0800 ON [id number]  BY SJW    Proteus species NOT DETECTED NOT DETECTED Final   Serratia marcescens NOT DETECTED NOT DETECTED Final   Carbapenem resistance NOT DETECTED NOT DETECTED Final   Haemophilus influenzae NOT DETECTED NOT DETECTED Final   Neisseria meningitidis NOT DETECTED NOT DETECTED Final   Pseudomonas aeruginosa NOT DETECTED NOT DETECTED Final   Candida albicans NOT DETECTED NOT DETECTED Final   Candida glabrata NOT DETECTED NOT DETECTED Final   Candida krusei NOT DETECTED NOT DETECTED Final   Candida parapsilosis NOT DETECTED NOT DETECTED Final   Candida tropicalis NOT DETECTED NOT DETECTED Final    Comment: Performed at [facility], [address] [place], [place] [address]  MRSA PCR Screening     Status: Abnormal   Collection Time: 09/16/18  3:37 AM   Specimen: Nasopharyngeal  Result Value Ref Range Status   MRSA by PCR POSITIVE (A) NEGATIVE Final    Comment:        The GeneXpert MRSA Assay (FDA approved for NASAL specimens only), is one component of a comprehensive MRSA colonization surveillance program. It is not intended to diagnose MRSA infection nor  to guide or monitor treatment for MRSA infections. RESULT CALLED TO, READ BACK BY AND VERIFIED WITH: LOFTIN,Q RN 09/16/2018 AT 1155 SKEEN,P Performed at [facility], [place] [address] [place], [place] [address]     Janene Madeira, MSN, NP-C [facility] for Infectious [place] Cell: [phone] Pager: [phone]  09/16/2018 11:05 AM

## 2018-09-16 NOTE — Progress Notes (Signed)
  Echocardiogram 2D Echocardiogram has been performed.  [name] 09/16/2018, 11:31 AM

## 2018-09-16 NOTE — Progress Notes (Signed)
Pt placed on air mattress for better skin care. I removed the gauze and foam dressing already on it. Scant serosanguinous drainage noted - barely any. There is a deep epitheliazed pucker down to the base of the wound, where there is a small, very shallow open area. It appeared pink to my assessment. I measured it at less than 1mm deep, 1.55cm by 0.6cm. This was all 4cm from surface of skin with intact skin all the way down to open area. I cut a piece of Aquacell to fit and placed it on the open area. While assessing this wound, I noted a shallow open area caudal to the left ischial wound on pt's left buttock which measures 1.12cm roughly circular. I also placed Aquacell on it. A square foam dressing fit over both of these.  Earlier in the day, I also assessed the wound on pt's left lateral malleolus: approximately 1.5cm by 1cm flat blister-like wound that was completely dry. Foam dressing over it.

## 2018-09-16 NOTE — Progress Notes (Addendum)
[facility]  PROGRESS NOTE  Nur Rabold PZW:[id number] DOB: 1950-12-07 DOA: 09/15/2018 PCP: [name], MD   Brief HPI:   68 year old female came to ED with fatigue, lethargy, fever.  She has a history of diabetes mellitus type 2, anemia, peripheral neuropathy, GERD, hypertension, hypothyroidism, paraplegia, sacral decubitus, hyperlipidemia, kidney stone.  She has suprapubic catheter due to neurogenic bladder.  In the ED patient was lethargic but arousable.  UA was found positive for UTI.  CT abdomen pelvis demonstrated likely fecal impaction and scattered stool and gas throughout the chronic confines.  Also demonstrate likely osteomyelitis involving left ischium related to chronic sacral decubitus ulcer.    Subjective   This morning patient denies any chest pain or shortness of breath.  Wants to replace mattress with  air mattress for decubitus ulcers.   Assessment/Plan:     1. Sepsis due to UTI-patient has Klebsiella bacteremia also Klebsiella growing in the urine culture.  Continue ceftriaxone.  Vancomycin has been discontinued.  Infectious disease following.  Will await final culture and sensitivity results.  2. Klebsiella pneumonia bacteremia-source likely urine.  ID does not feel that patient has ischial osteomyelitis. Consider MRI or bone biopsy if the wound does not heal at the ischium.  3. Diabetes mellitus type 2-blood glucose is elevated in 200s.  Continue moderate sliding scale insulin, will add Lantus 10 units subcu daily.  4. Sacral decubitus ulcer-wound care consulted, Continue wound care per nursing.  5. Hypomagnesemia-magnesium is 1.4, will replace magnesium with 2 g mag sulfate IV x1.  Check serum magnesium level in a.m.     CBG: Recent Labs  Lab 09/15/18 2227 09/16/18 0812 09/16/18 1145  GLUCAP 166* 222* 229*    CBC: Recent Labs  Lab 09/15/18 1153 09/15/18 1403 09/15/18 1702 09/15/18 1739  WBC 16.8*  --  11.4*  --   NEUTROABS 15.6*  --   --    --   HGB 13.9 13.6 12.5 12.2  HCT 44.1 40.0 39.6 36.0  MCV 96.7  --  98.0  --   PLT 192  --  156  --     Basic Metabolic Panel: Recent Labs  Lab 09/15/18 1153 09/15/18 1403 09/15/18 1702 09/15/18 1739  NA 137 138  --  140  K 4.8 4.8  --  4.6  CL 100  --   --   --   CO2 23  --   --   --   GLUCOSE 270*  --   --   --   BUN 30*  --   --   --   CREATININE 1.44*  --  1.23*  --   CALCIUM 9.1  --   --   --   MG  --   --  1.4*  --   PHOS  --   --  2.6  --      DVT prophylaxis: Heparin  Code Status: Full code  Family Communication: No family at bedside  Disposition Plan: likely home  In next 2- 3 days     Consultants:    Procedures:     Antibiotics:   Anti-infectives (From admission, onward)   Start     Dose/Rate Route Frequency Ordered Stop   09/17/18 1500  vancomycin (VANCOCIN) 1,250 mg in sodium chloride 0.9 % 250 mL IVPB  Status:  Discontinued     1,250 mg 166.7 mL/hr over 90 Minutes Intravenous Every 48 hours 09/15/18 1412 09/16/18 1321   09/16/18 2000  cefTRIAXone (ROCEPHIN) 2 g  in sodium chloride 0.9 % 100 mL IVPB     2 g 200 mL/hr over 30 Minutes Intravenous Every 24 hours 09/16/18 1105     09/16/18 0200  ceFEPIme (MAXIPIME) 2 g in sodium chloride 0.9 % 100 mL IVPB  Status:  Discontinued     2 g 200 mL/hr over 30 Minutes Intravenous Every 12 hours 09/15/18 1412 09/16/18 1105   09/15/18 1700  ceFEPIme (MAXIPIME) 2 g in sodium chloride 0.9 % 100 mL IVPB  Status:  Discontinued     2 g 200 mL/hr over 30 Minutes Intravenous  Once 09/15/18 1649 09/15/18 1700   09/15/18 1400  vancomycin (VANCOCIN) 1,500 mg in sodium chloride 0.9 % 500 mL IVPB     1,500 mg 250 mL/hr over 120 Minutes Intravenous  Once 09/15/18 1346 09/15/18 1649   09/15/18 1345  ceFEPIme (MAXIPIME) 2 g in sodium chloride 0.9 % 100 mL IVPB     2 g 200 mL/hr over 30 Minutes Intravenous  Once 09/15/18 1343 09/15/18 1458   09/15/18 1345  metroNIDAZOLE (FLAGYL) IVPB 500 mg     500 mg 100 mL/hr  over 60 Minutes Intravenous  Once 09/15/18 1343 09/15/18 1600   09/15/18 1345  vancomycin (VANCOCIN) IVPB 1000 mg/200 mL premix  Status:  Discontinued     1,000 mg 200 mL/hr over 60 Minutes Intravenous  Once 09/15/18 1343 09/15/18 1346       Objective   Vitals:   09/16/18 0153 09/16/18 0653 09/16/18 0842 09/16/18 1037  BP: 136/69 (!) 148/55 (!) 152/78 (!) 159/72  Pulse: (!) 105 (!) 112 (!) 110 (!) 108  Resp: 20 16 14 15   Temp: 98.5 F (36.9 C) 98.3 F (36.8 C) 98.3 F (36.8 C) 98.3 F (36.8 C)  TempSrc: Oral Oral Oral Oral  SpO2: 96% 100% 98% 96%  Weight:      Height:        Intake/Output Summary (Last 24 hours) at 09/16/2018 1558 Last data filed at 09/16/2018 1610 Gross per 24 hour  Intake 1100 ml  Output 2350 ml  Net -1250 ml   Filed Weights   09/15/18 1538 09/15/18 2237  Weight: 90.7 kg 100.1 kg     Physical Examination:    General-appears in no acute distress  Heart-S1-S2, regular, no murmur auscultated  Lungs-clear to auscultation bilaterally, no wheezing or crackles auscultated  Abdomen-soft, nontender, no organomegaly  Extremities-trace edema in the lower extremities  Neuro-alert, oriented x3, no focal deficit noted     Data Reviewed: I have personally reviewed following labs and imaging studies   Recent Results (from the past 240 hour(s))  Urine culture     Status: Abnormal (Preliminary result)   Collection Time: 09/15/18 11:53 AM   Specimen: Urine, Random  Result Value Ref Range Status   Specimen Description URINE, RANDOM  Final   Special Requests NONE  Final   Culture (A)  Final    >=100,000 COLONIES/mL KLEBSIELLA PNEUMONIAE CULTURE REINCUBATED FOR BETTER GROWTH Performed at [facility], [address], [place], [place] [address]    Report Status PENDING  Incomplete  SARS Coronavirus 2 (CEPHEID- Performed in [place] hospital lab), Hosp Order     Status: None   Collection Time: 09/15/18 11:54 AM   Specimen: Nasopharyngeal  Swab  Result Value Ref Range Status   SARS Coronavirus 2 NEGATIVE NEGATIVE Final    Comment: (NOTE) If result is NEGATIVE SARS-CoV-2 target nucleic acids are NOT DETECTED. The SARS-CoV-2 RNA is generally detectable  in upper and lower  respiratory specimens during the acute phase of infection. The lowest  concentration of SARS-CoV-2 viral copies this assay can detect is 250  copies / mL. A negative result does not preclude SARS-CoV-2 infection  and should not be used as the sole basis for treatment or other  patient management decisions.  A negative result may occur with  improper specimen collection / handling, submission of specimen other  than nasopharyngeal swab, presence of viral mutation(s) within the  areas targeted by this assay, and inadequate number of viral copies  (<250 copies / mL). A negative result must be combined with clinical  observations, patient history, and epidemiological information. If result is POSITIVE SARS-CoV-2 target nucleic acids are DETECTED. The SARS-CoV-2 RNA is generally detectable in upper and lower  respiratory specimens dur ing the acute phase of infection.  Positive  results are indicative of active infection with SARS-CoV-2.  Clinical  correlation with patient history and other diagnostic information is  necessary to determine patient infection status.  Positive results do  not rule out bacterial infection or co-infection with other viruses. If result is PRESUMPTIVE POSTIVE SARS-CoV-2 nucleic acids MAY BE PRESENT.   A presumptive positive result was obtained on the submitted specimen  and confirmed on repeat testing.  While 2019 novel coronavirus  (SARS-CoV-2) nucleic acids may be present in the submitted sample  additional confirmatory testing may be necessary for epidemiological  and / or clinical management purposes  to differentiate between  SARS-CoV-2 and other Sarbecovirus currently known to infect humans.  If clinically indicated  additional testing with an alternate test  methodology [phone]) is advised. The SARS-CoV-2 RNA is generally  detectable in upper and lower respiratory sp ecimens during the acute  phase of infection. The expected result is Negative. Fact Sheet for Patients:  StrictlyIdeas.no Fact Sheet for Healthcare Providers: BankingDealers.co.za This test is not yet approved or cleared by the [place] FDA and has been authorized for detection and/or diagnosis of SARS-CoV-2 by FDA under an Emergency Use Authorization (EUA).  This EUA will remain in effect (meaning this test can be used) for the duration of the COVID-19 declaration under Section 564(b)(1) of the Act, 21 U.S.C. section 360bbb-3(b)(1), unless the authorization is terminated or revoked sooner. Performed at [facility], [place] [address], [place], [place] [address]   Blood Culture (routine x 2)     Status: None (Preliminary result)   Collection Time: 09/15/18 11:58 AM   Specimen: BLOOD  Result Value Ref Range Status   Specimen Description BLOOD BLOOD RIGHT FOREARM  Final   Special Requests   Final    BOTTLES DRAWN AEROBIC AND ANAEROBIC Blood Culture adequate volume   Culture  Setup Time   Final    GRAM NEGATIVE RODS AEROBIC BOTTLE ONLY CRITICAL VALUE NOTED.  VALUE IS CONSISTENT WITH PREVIOUSLY REPORTED AND CALLED VALUE. Performed at [facility], [place] [address], [place],  [address]    Culture GRAM NEGATIVE RODS  Final   Report Status PENDING  Incomplete  Blood Culture (routine x 2)     Status: None (Preliminary result)   Collection Time: 09/15/18 12:36 PM   Specimen: BLOOD  Result Value Ref Range Status   Specimen Description BLOOD SITE NOT SPECIFIED  Final   Special Requests   Final    BOTTLES DRAWN AEROBIC AND ANAEROBIC Blood Culture results may not be optimal due to an inadequate volume of blood received in culture bottles   Culture  Setup Time   Final     IN BOTH AEROBIC AND ANAEROBIC BOTTLES Organism ID to follow GRAM NEGATIVE RODS CRITICAL RESULT CALLED TO, READ BACK BY AND VERIFIED WITH: MCCARTHY PHARMD AT 0800 ON [id number] BY SJW Performed at [facility], [place] [address], [place], [place] [address]    Culture GRAM NEGATIVE RODS  Final   Report Status PENDING  Incomplete  Blood Culture ID Panel (Reflexed)     Status: Abnormal   Collection Time: 09/15/18 12:36 PM  Result Value Ref Range Status   Enterococcus species NOT DETECTED NOT DETECTED Final   Listeria monocytogenes NOT DETECTED NOT DETECTED Final   Staphylococcus species NOT DETECTED NOT DETECTED Final   Staphylococcus aureus (BCID) NOT DETECTED NOT DETECTED Final   Streptococcus species NOT DETECTED NOT DETECTED Final   Streptococcus agalactiae NOT DETECTED NOT DETECTED Final   Streptococcus pneumoniae NOT DETECTED NOT DETECTED Final   Streptococcus pyogenes NOT DETECTED NOT DETECTED Final   Acinetobacter baumannii NOT DETECTED NOT DETECTED Final   Enterobacteriaceae species DETECTED (A) NOT DETECTED Final    Comment: Enterobacteriaceae represent a large family of gram-negative bacteria, not a single organism. CRITICAL RESULT CALLED TO, READ BACK BY AND VERIFIED WITH: MCCARTHY PHARMD AT 0800 ON [id number] BY SJW    Enterobacter cloacae complex NOT DETECTED NOT DETECTED Final   Escherichia coli NOT DETECTED NOT DETECTED Final   Klebsiella oxytoca NOT DETECTED NOT DETECTED Final   Klebsiella pneumoniae DETECTED (A) NOT DETECTED Final    Comment: CRITICAL RESULT CALLED TO, READ BACK BY AND VERIFIED WITH: MCCARTHY PHARMD AT 0800 ON [id number] BY SJW    Proteus species NOT DETECTED NOT DETECTED Final   Serratia marcescens NOT DETECTED NOT DETECTED Final   Carbapenem resistance NOT DETECTED NOT DETECTED Final   Haemophilus influenzae NOT DETECTED NOT DETECTED Final   Neisseria meningitidis NOT DETECTED NOT DETECTED Final   Pseudomonas aeruginosa NOT DETECTED NOT DETECTED Final    Candida albicans NOT DETECTED NOT DETECTED Final   Candida glabrata NOT DETECTED NOT DETECTED Final   Candida krusei NOT DETECTED NOT DETECTED Final   Candida parapsilosis NOT DETECTED NOT DETECTED Final   Candida tropicalis NOT DETECTED NOT DETECTED Final    Comment: Performed at [facility], [address] [place], [place] [address]  MRSA PCR Screening     Status: Abnormal   Collection Time: 09/16/18  3:37 AM   Specimen: Nasopharyngeal  Result Value Ref Range Status   MRSA by PCR POSITIVE (A) NEGATIVE Final    Comment:        The GeneXpert MRSA Assay (FDA approved for NASAL specimens only), is one component of a comprehensive MRSA colonization surveillance program. It is not intended to diagnose MRSA infection nor to guide or monitor treatment for MRSA infections. RESULT CALLED TO, READ BACK BY AND VERIFIED WITH: LOFTIN,Q RN 09/16/2018 AT 9242 SKEEN,P Performed at [facility], [place] [address], [place], [place] [address]      Liver Function Tests: Recent Labs  Lab 09/15/18 1153  AST 51*  ALT 44  ALKPHOS 59  BILITOT 0.9  PROT 7.3  ALBUMIN 3.3*   No results for input(s): LIPASE, AMYLASE in the last 168 hours. No results for input(s): AMMONIA in the last 168 hours.  Cardiac Enzymes: Recent Labs  Lab 09/15/18 1153  TROPONINI <0.03   BNP (last 3 results) Recent Labs    09/15/18 1153  BNP 76.7    ProBNP (last 3 results)  No results for input(s): PROBNP in the last 8760 hours.    Studies: Ct Abdomen Pelvis Wo Contrast  Result Date: 09/15/2018 CLINICAL DATA:  High grade bowel obstruction, history breast cancer, diabetes mellitus, hypertension, GERD, kidney stones EXAM: CT ABDOMEN AND PELVIS WITHOUT CONTRAST TECHNIQUE: Multidetector CT imaging of the abdomen and pelvis was performed following the standard protocol without IV contrast. Sagittal and coronal MPR images reconstructed from axial data set. No oral contrast was administered COMPARISON:   04/27/2017 FINDINGS: Lower chest: Bibasilar atelectasis and minimal RIGHT pleural effusion Hepatobiliary: Diffuse fatty infiltration of liver. Minimal dependent density in gallbladder question tiny layered calculi. No focal hepatic mass lesion. Pancreas: Atrophic pancreas without mass Spleen: Calcified granulomata.  No mass. Adrenals/Urinary Tract: Adrenal thickening without focal mass. Adjacent calculi at inferior pole RIGHT kidney, larger 8 mm diameter. Small focus of gas identified within collecting system at inferior pole RIGHT kidney, uncertain etiology. Tiny nonobstructing calculi at inferior pole LEFT kidney. Vague area of low attenuation at upper pole LEFT kidney may represent a subtle cyst, unchanged. Kidneys otherwise unremarkable. No additional renal mass or hydronephrosis. Suprapubic catheter into decompressed urinary bladder. Stomach/Bowel: Normal appendix. Stomach decompressed. Scattered stool throughout colon, with significantly increased stool in rectum. Bowel loops otherwise normal appearance without dilatation, evidence of obstruction or wall thickening. Vascular/Lymphatic: Atherosclerotic calcifications aorta and iliac arteries without aneurysm. Reproductive: Uterus surgically absent. Question atrophic ovaries bilaterally. Other: No free air or free fluid.  No hernia. Musculoskeletal: Osseous demineralization. Loss of cortical margination of the inferior aspect of the LEFT ischium, with overlying decubitus track, suspicious for osteomyelitis. IMPRESSION: BILATERAL nonobstructing renal calculi. Small focus of gas within collecting system mid inferior pole RIGHT kidney of uncertain etiology and significance. Suspected small cyst at upper pole LEFT kidney. Markedly increased stool in rectum. Bone destruction/loss of cortical margination at inferior aspect of the LEFT ischium suspicious for osteomyelitis. Fatty infiltration of liver. Questionable cholelithiasis. Electronically Signed   By: Lavonia Dana  M.D.   On: 09/15/2018 17:15   Ct Head Wo Contrast  Result Date: 09/15/2018 CLINICAL DATA:  Altered level of consciousness. EXAM: CT HEAD WITHOUT CONTRAST TECHNIQUE: Contiguous axial images were obtained from the base of the skull through the vertex without intravenous contrast. COMPARISON:  CT scan of Aug 10, 2015. FINDINGS: Brain: Minimal chronic ischemic white matter disease is noted. No mass effect or midline shift is noted. Ventricular size is within normal limits. There is no evidence of mass lesion, hemorrhage or acute infarction. Vascular: No hyperdense vessel or unexpected calcification. Skull: Normal. Negative for fracture or focal lesion. Sinuses/Orbits: No acute finding. Other: None. IMPRESSION: Minimal chronic ischemic white matter disease. No acute intracranial abnormality seen. Electronically Signed   By: Marijo Conception M.D.   On: 09/15/2018 13:34   Dg Chest Port 1 View  Result Date: 09/15/2018 CLINICAL DATA:  Fever, hypoxia and altered mental status. EXAM: PORTABLE CHEST 1 VIEW COMPARISON:  04/26/2017 FINDINGS: The heart is borderline enlarged but stable. The mediastinal and hilar contours are slightly prominent but unchanged. Low lung volumes with vascular crowding and atelectasis. Underlying chronic lung changes. No definite infiltrates or effusions. The bony thorax is intact. IMPRESSION: Low lung volumes with vascular crowding and atelectasis and underlying chronic lung changes but no definite acute infiltrates. Electronically Signed   By: Marijo Sanes M.D.   On: 09/15/2018 12:21    Scheduled Meds: . Chlorhexidine Gluconate Cloth  6 each Topical Q0600  . DULoxetine  120 mg Oral Daily  .  ferrous sulfate  325 mg Oral QPC supper  . folic acid  1 mg Oral Daily  . furosemide  40 mg Oral Daily  . heparin  5,000 Units Subcutaneous Q8H  . insulin aspart  0-15 Units Subcutaneous TID WC  . levothyroxine  125 mcg Oral QAC breakfast  . metoprolol tartrate  100 mg Oral BID  . morphine  30  mg Oral BID  . multivitamin with minerals  1 tablet Oral Daily  . mupirocin ointment  1 application Nasal BID  . NIFEdipine  30 mg Oral QPC supper  . polyethylene glycol  17 g Oral BID  . pregabalin  75 mg Oral BID  . senna  2 tablet Oral QHS  . ascorbic acid  500 mg Oral Daily  . zinc sulfate  220 mg Oral Daily    Admission status: Inpatient: Based on patients clinical presentation and evaluation of above clinical data, I have made determination that patient meets Inpatient criteria at this time.   Time spent: 25 min  [facility] Pager [phone]. If 7PM-7AM, please contact night-coverage at [email or web address], Office  [phone]  password [email or web address]  09/16/2018, 3:58 PM  LOS: 1 day

## 2018-09-17 ENCOUNTER — Inpatient Hospital Stay (HOSPITAL_COMMUNITY): Payer: PPO

## 2018-09-17 DIAGNOSIS — J9601 Acute respiratory failure with hypoxia: Secondary | ICD-10-CM

## 2018-09-17 DIAGNOSIS — J9602 Acute respiratory failure with hypercapnia: Secondary | ICD-10-CM

## 2018-09-17 DIAGNOSIS — Z981 Arthrodesis status: Secondary | ICD-10-CM

## 2018-09-17 DIAGNOSIS — A419 Sepsis, unspecified organism: Secondary | ICD-10-CM

## 2018-09-17 DIAGNOSIS — N390 Urinary tract infection, site not specified: Secondary | ICD-10-CM

## 2018-09-17 DIAGNOSIS — J810 Acute pulmonary edema: Secondary | ICD-10-CM

## 2018-09-17 LAB — BLOOD GAS, ARTERIAL
Acid-Base Excess: 0.7 mmol/L (ref 0.0–2.0)
Acid-Base Excess: 3.6 mmol/L — ABNORMAL HIGH (ref 0.0–2.0)
Acid-base deficit: 7.3 mmol/L — ABNORMAL HIGH (ref 0.0–2.0)
Bicarbonate: 22.6 mmol/L (ref 20.0–28.0)
Bicarbonate: 25.6 mmol/L (ref 20.0–28.0)
Bicarbonate: 27.5 mmol/L (ref 20.0–28.0)
Delivery systems: POSITIVE
Drawn by: 252031
Drawn by: 252031
Drawn by: 398981
Expiratory PAP: 6
FIO2: 100
FIO2: 80
Inspiratory PAP: 18
O2 Content: 12 L/min
O2 Saturation: 81.9 %
O2 Saturation: 84.1 %
O2 Saturation: 93.9 %
Patient temperature: 98.6
Patient temperature: 98.6
Patient temperature: 98.6
pCO2 arterial: 41.1 mmHg (ref 32.0–48.0)
pCO2 arterial: 47.1 mmHg (ref 32.0–48.0)
pCO2 arterial: 93.2 mmHg (ref 32.0–48.0)
pH, Arterial: 7.014 — CL (ref 7.350–7.450)
pH, Arterial: 7.355 (ref 7.350–7.450)
pH, Arterial: 7.442 (ref 7.350–7.450)
pO2, Arterial: 45.5 mmHg — ABNORMAL LOW (ref 83.0–108.0)
pO2, Arterial: 74.1 mmHg — ABNORMAL LOW (ref 83.0–108.0)
pO2, Arterial: 74.2 mmHg — ABNORMAL LOW (ref 83.0–108.0)

## 2018-09-17 LAB — BASIC METABOLIC PANEL
Anion gap: 10 (ref 5–15)
BUN: 16 mg/dL (ref 8–23)
CO2: 24 mmol/L (ref 22–32)
Calcium: 8.6 mg/dL — ABNORMAL LOW (ref 8.9–10.3)
Chloride: 101 mmol/L (ref 98–111)
Creatinine, Ser: 1.13 mg/dL — ABNORMAL HIGH (ref 0.44–1.00)
GFR calc Af Amer: 57 mL/min — ABNORMAL LOW (ref >60)
GFR calc non Af Amer: 49 mL/min — ABNORMAL LOW (ref >60)
Glucose, Bld: 322 mg/dL — ABNORMAL HIGH (ref 70–99)
Potassium: 4.6 mmol/L (ref 3.5–5.1)
Sodium: 135 mmol/L (ref 135–145)

## 2018-09-17 LAB — GLUCOSE, CAPILLARY
Glucose-Capillary: 287 mg/dL — ABNORMAL HIGH (ref 70–99)
Glucose-Capillary: 242 mg/dL — ABNORMAL HIGH (ref 70–99)
Glucose-Capillary: 230 mg/dL — ABNORMAL HIGH (ref 70–99)
Glucose-Capillary: 218 mg/dL — ABNORMAL HIGH (ref 70–99)
Glucose-Capillary: 147 mg/dL — ABNORMAL HIGH (ref 70–99)

## 2018-09-17 LAB — CBC
HCT: 43.1 % (ref 36.0–46.0)
Hemoglobin: 14 g/dL (ref 12.0–15.0)
MCH: 30.8 pg (ref 26.0–34.0)
MCHC: 32.5 g/dL (ref 30.0–36.0)
MCV: 94.7 fL (ref 80.0–100.0)
Platelets: 191 10*3/uL (ref 150–400)
RBC: 4.55 MIL/uL (ref 3.87–5.11)
RDW: 13.4 % (ref 11.5–15.5)
WBC: 10.5 10*3/uL (ref 4.0–10.5)
nRBC: 0 % (ref 0.0–0.2)

## 2018-09-17 LAB — LACTIC ACID, PLASMA: Lactic Acid, Venous: 3.1 mmol/L (ref 0.5–1.9)

## 2018-09-17 LAB — MAGNESIUM: Magnesium: 1.9 mg/dL (ref 1.7–2.4)

## 2018-09-17 LAB — BRAIN NATRIURETIC PEPTIDE: B Natriuretic Peptide: 178.8 pg/mL — ABNORMAL HIGH (ref 0.0–100.0)

## 2018-09-17 MED ORDER — FUROSEMIDE 10 MG/ML IJ SOLN
INTRAMUSCULAR | Status: AC
  Filled 2018-09-17: qty 8

## 2018-09-17 MED ORDER — FUROSEMIDE 10 MG/ML IJ SOLN
40.0000 mg | Freq: Once | INTRAMUSCULAR | Status: AC
  Administered 2018-09-17: 40 mg via INTRAVENOUS
  Filled 2018-09-17: qty 4

## 2018-09-17 MED ORDER — ENSURE MAX PROTEIN PO LIQD
11.0000 [oz_av] | Freq: Every day | ORAL | Status: DC
  Administered 2018-09-20 – 2018-09-21 (×2): 11 [oz_av] via ORAL
  Filled 2018-09-17 (×6): qty 330

## 2018-09-17 MED ORDER — LORAZEPAM 2 MG/ML IJ SOLN
0.5000 mg | Freq: Once | INTRAMUSCULAR | Status: AC
  Administered 2018-09-17: 0.5 mg via INTRAVENOUS
  Filled 2018-09-17: qty 1

## 2018-09-17 MED ORDER — FUROSEMIDE 10 MG/ML IJ SOLN
80.0000 mg | Freq: Once | INTRAMUSCULAR | Status: AC
  Administered 2018-09-17: 80 mg via INTRAVENOUS

## 2018-09-17 MED ORDER — PRO-STAT SUGAR FREE PO LIQD
30.0000 mL | Freq: Three times a day (TID) | ORAL | Status: DC
  Administered 2018-09-18 – 2018-09-21 (×3): 30 mL via ORAL
  Filled 2018-09-17 (×8): qty 30

## 2018-09-17 MED ORDER — INSULIN GLARGINE 100 UNIT/ML ~~LOC~~ SOLN
15.0000 [IU] | Freq: Every day | SUBCUTANEOUS | Status: DC
  Administered 2018-09-17 – 2018-09-19 (×3): 15 [IU] via SUBCUTANEOUS
  Filled 2018-09-17 (×4): qty 0.15

## 2018-09-17 MED ORDER — FUROSEMIDE 10 MG/ML IJ SOLN
40.0000 mg | Freq: Two times a day (BID) | INTRAMUSCULAR | Status: DC
  Administered 2018-09-17 – 2018-09-18 (×4): 40 mg via INTRAVENOUS
  Filled 2018-09-17 (×4): qty 4

## 2018-09-17 NOTE — Progress Notes (Addendum)
[facility]  PROGRESS NOTE  [name] KGM:[id number] DOB: 12/09/1950 DOA: 09/15/2018 PCP: [name], MD   Brief HPI:   68 year old female came to ED with fatigue, lethargy, fever.  She has a history of diabetes mellitus type 2, anemia, peripheral neuropathy, GERD, hypertension, hypothyroidism, paraplegia, sacral decubitus, hyperlipidemia, kidney stone.  She has suprapubic catheter due to neurogenic bladder.  In the ED patient was lethargic but arousable.  UA was found positive for UTI.  CT abdomen pelvis demonstrated likely fecal impaction and scattered stool and gas throughout the chronic confines.  Also demonstrate likely osteomyelitis involving left ischium related to chronic sacral decubitus ulcer.    Subjective   Patient seen and examined, went into respiratory distress last night was started on BiPAP.  Chest x-ray showed pulmonary edema.  Good diuretic response with IV Lasix.   Assessment/Plan:     1. Sepsis due to UTI-patient has Klebsiella bacteremia also Klebsiella and E. coli growing in the urine culture.  Continue ceftriaxone.  Vancomycin has been discontinued.  Infectious disease following.  Will await final culture and sensitivity results.  2. Klebsiella pneumonia bacteremia-source likely urine.  ID does not feel that patient has ischial osteomyelitis. Consider MRI or bone biopsy if the wound does not heal at the ischium.  3. Acute hypoxic/hypercapnic respiratory failure-improved ABG last night showed pH 7.014, PCO2 93.2, PO2 74.1, reflecting acute respiratory acidosis.  Improved with BiPAP, repeat ABG showed pH 7.355, PCO2 47.1.  Patient also given 1 dose of Lasix 40 mg IV.  Also patient found to be hypertensive urgency, chest x-ray consistent with pulmonary edema.  4. Acute on chronic diastolic CHF-patient likely has worsening of chronic diastolic CHF with hypertensive urgency.  Continue with Lasix 40 mg IV every 12 hours.  5. Hypertension-improved after starting  on Lasix.  Continue metoprolol 100 g p.o. twice daily, nifedipine 30 mg p.o. daily  6. Diabetes mellitus type 2-blood glucose is elevated in 200s.  Continue moderate sliding scale insulin, will increase dose of Lantus to 20 units subcu daily.   7. Sacral decubitus ulcer-wound care consulted, Continue wound care per nursing.  8. Hypomagnesemia-magnesium was 1.4 yesterday, replaced repeat magnesium today is 1.9.     CBG: Recent Labs  Lab 09/16/18 1713 09/16/18 2057 09/17/18 0419 09/17/18 0846 09/17/18 1246  GLUCAP 192* 210* 287* 242* 230*    CBC: Recent Labs  Lab 09/15/18 1153 09/15/18 1403 09/15/18 1702 09/15/18 1739 09/17/18 0326  WBC 16.8*  --  11.4*  --  10.5  NEUTROABS 15.6*  --   --   --   --   HGB 13.9 13.6 12.5 12.2 14.0  HCT 44.1 40.0 39.6 36.0 43.1  MCV 96.7  --  98.0  --  94.7  PLT 192  --  156  --  644    Basic Metabolic Panel: Recent Labs  Lab 09/15/18 1153 09/15/18 1403 09/15/18 1702 09/15/18 1739 09/17/18 0326  NA 137 138  --  140 135  K 4.8 4.8  --  4.6 4.6  CL 100  --   --   --  101  CO2 23  --   --   --  24  GLUCOSE 270*  --   --   --  322*  BUN 30*  --   --   --  16  CREATININE 1.44*  --  1.23*  --  1.13*  CALCIUM 9.1  --   --   --  8.6*  MG  --   --  1.4*  --  1.9  PHOS  --   --  2.6  --   --      DVT prophylaxis: Heparin  Code Status: Full code  Family Communication: Discussed with patient's husband and daughter on phone.  Disposition Plan: likely home next week after clinically improved.     Consultants:    Procedures:     Antibiotics:   Anti-infectives (From admission, onward)   Start     Dose/Rate Route Frequency Ordered Stop   09/17/18 1500  vancomycin (VANCOCIN) 1,250 mg in sodium chloride 0.9 % 250 mL IVPB  Status:  Discontinued     1,250 mg 166.7 mL/hr over 90 Minutes Intravenous Every 48 hours 09/15/18 1412 09/16/18 1321   09/16/18 2000  cefTRIAXone (ROCEPHIN) 2 g in sodium chloride 0.9 % 100 mL IVPB      2 g 200 mL/hr over 30 Minutes Intravenous Every 24 hours 09/16/18 1105     09/16/18 0200  ceFEPIme (MAXIPIME) 2 g in sodium chloride 0.9 % 100 mL IVPB  Status:  Discontinued     2 g 200 mL/hr over 30 Minutes Intravenous Every 12 hours 09/15/18 1412 09/16/18 1105   09/15/18 1700  ceFEPIme (MAXIPIME) 2 g in sodium chloride 0.9 % 100 mL IVPB  Status:  Discontinued     2 g 200 mL/hr over 30 Minutes Intravenous  Once 09/15/18 1649 09/15/18 1700   09/15/18 1400  vancomycin (VANCOCIN) 1,500 mg in sodium chloride 0.9 % 500 mL IVPB     1,500 mg 250 mL/hr over 120 Minutes Intravenous  Once 09/15/18 1346 09/15/18 1649   09/15/18 1345  ceFEPIme (MAXIPIME) 2 g in sodium chloride 0.9 % 100 mL IVPB     2 g 200 mL/hr over 30 Minutes Intravenous  Once 09/15/18 1343 09/15/18 1458   09/15/18 1345  metroNIDAZOLE (FLAGYL) IVPB 500 mg     500 mg 100 mL/hr over 60 Minutes Intravenous  Once 09/15/18 1343 09/15/18 1600   09/15/18 1345  vancomycin (VANCOCIN) IVPB 1000 mg/200 mL premix  Status:  Discontinued     1,000 mg 200 mL/hr over 60 Minutes Intravenous  Once 09/15/18 1343 09/15/18 1346       Objective   Vitals:   09/17/18 0614 09/17/18 0808 09/17/18 0926 09/17/18 1018  BP: 119/81   133/70  Pulse: 99 (!) 102  (!) 103  Resp: 13 19    Temp:      TempSrc:      SpO2: 96% 98% 90%   Weight:      Height:        Intake/Output Summary (Last 24 hours) at 09/17/2018 1406 Last data filed at 09/17/2018 1341 Gross per 24 hour  Intake 222 ml  Output 1700 ml  Net -1478 ml   Filed Weights   09/15/18 1538 09/15/18 2237  Weight: 90.7 kg 100.1 kg     Physical Examination:   General-appears in no acute distress Heart-S1-S2, regular, no murmur auscultated Lungs-bilateral rhonchi auscultated at lung bases Abdomen-soft, nontender, no organomegaly Extremities-no edema in the lower extremities, paraplegia Neuro-alert, oriented x3, no focal deficit noted    Data Reviewed: I have personally reviewed  following labs and imaging studies   Recent Results (from the past 240 hour(s))  Urine culture     Status: Abnormal (Preliminary result)   Collection Time: 09/15/18 11:53 AM   Specimen: Urine, Random  Result Value Ref Range Status   Specimen Description URINE, RANDOM  Final   Special Requests  Final    NONE Performed at [facility], [place] [address] [place], [place] [address]    Culture (A)  Final    >=100,000 COLONIES/mL KLEBSIELLA PNEUMONIAE >=100,000 COLONIES/mL ESCHERICHIA COLI    Report Status PENDING  Incomplete   Organism ID, Bacteria KLEBSIELLA PNEUMONIAE (A)  Final      Susceptibility   Klebsiella pneumoniae - MIC*    AMPICILLIN >=32 RESISTANT Resistant     CEFAZOLIN <=4 SENSITIVE Sensitive     CEFTRIAXONE <=1 SENSITIVE Sensitive     CIPROFLOXACIN <=0.25 SENSITIVE Sensitive     GENTAMICIN <=1 SENSITIVE Sensitive     IMIPENEM <=0.25 SENSITIVE Sensitive     NITROFURANTOIN 128 RESISTANT Resistant     TRIMETH/SULFA <=20 SENSITIVE Sensitive     AMPICILLIN/SULBACTAM >=32 RESISTANT Resistant     PIP/TAZO <=4 SENSITIVE Sensitive     Extended ESBL NEGATIVE Sensitive     * >=100,000 COLONIES/mL KLEBSIELLA PNEUMONIAE  SARS Coronavirus 2 (CEPHEID- Performed in [facility] lab), Hosp Order     Status: None   Collection Time: 09/15/18 11:54 AM   Specimen: Nasopharyngeal Swab  Result Value Ref Range Status   SARS Coronavirus 2 NEGATIVE NEGATIVE Final    Comment: (NOTE) If result is NEGATIVE SARS-CoV-2 target nucleic acids are NOT DETECTED. The SARS-CoV-2 RNA is generally detectable in upper and lower  respiratory specimens during the acute phase of infection. The lowest  concentration of SARS-CoV-2 viral copies this assay can detect is 250  copies / mL. A negative result does not preclude SARS-CoV-2 infection  and should not be used as the sole basis for treatment or other  patient management decisions.  A negative result may occur with  improper specimen  collection / handling, submission of specimen other  than nasopharyngeal swab, presence of viral mutation(s) within the  areas targeted by this assay, and inadequate number of viral copies  (<250 copies / mL). A negative result must be combined with clinical  observations, patient history, and epidemiological information. If result is POSITIVE SARS-CoV-2 target nucleic acids are DETECTED. The SARS-CoV-2 RNA is generally detectable in upper and lower  respiratory specimens dur ing the acute phase of infection.  Positive  results are indicative of active infection with SARS-CoV-2.  Clinical  correlation with patient history and other diagnostic information is  necessary to determine patient infection status.  Positive results do  not rule out bacterial infection or co-infection with other viruses. If result is PRESUMPTIVE POSTIVE SARS-CoV-2 nucleic acids MAY BE PRESENT.   A presumptive positive result was obtained on the submitted specimen  and confirmed on repeat testing.  While 2019 novel coronavirus  (SARS-CoV-2) nucleic acids may be present in the submitted sample  additional confirmatory testing may be necessary for epidemiological  and / or clinical management purposes  to differentiate between  SARS-CoV-2 and other Sarbecovirus currently known to infect humans.  If clinically indicated additional testing with an alternate test  methodology [phone]) is advised. The SARS-CoV-2 RNA is generally  detectable in upper and lower respiratory sp ecimens during the acute  phase of infection. The expected result is Negative. Fact Sheet for Patients:  StrictlyIdeas.no Fact Sheet for Healthcare Providers: BankingDealers.co.za This test is not yet approved or cleared by the [place] FDA and has been authorized for detection and/or diagnosis of SARS-CoV-2 by FDA under an Emergency Use Authorization (EUA).  This EUA will remain in effect  (meaning this test can be used) for the duration of the COVID-19  declaration under Section 564(b)(1) of the Act, 21 U.S.C. section 360bbb-3(b)(1), unless the authorization is terminated or revoked sooner. Performed at [facility], [place] [address], [place], [place] [address]   Blood Culture (routine x 2)     Status: None (Preliminary result)   Collection Time: 09/15/18 11:58 AM   Specimen: BLOOD  Result Value Ref Range Status   Specimen Description BLOOD BLOOD RIGHT FOREARM  Final   Special Requests   Final    BOTTLES DRAWN AEROBIC AND ANAEROBIC Blood Culture adequate volume   Culture  Setup Time   Final    GRAM NEGATIVE RODS AEROBIC BOTTLE ONLY CRITICAL VALUE NOTED.  VALUE IS CONSISTENT WITH PREVIOUSLY REPORTED AND CALLED VALUE. Performed at [facility], [place] [address] [place], [place] [address]    Culture GRAM NEGATIVE RODS  Final   Report Status PENDING  Incomplete  Blood Culture (routine x 2)     Status: Abnormal (Preliminary result)   Collection Time: 09/15/18 12:36 PM   Specimen: BLOOD  Result Value Ref Range Status   Specimen Description BLOOD SITE NOT SPECIFIED  Final   Special Requests   Final    BOTTLES DRAWN AEROBIC AND ANAEROBIC Blood Culture results may not be optimal due to an inadequate volume of blood received in culture bottles   Culture  Setup Time   Final    IN BOTH AEROBIC AND ANAEROBIC BOTTLES Organism ID to follow GRAM NEGATIVE RODS CRITICAL RESULT CALLED TO, READ BACK BY AND VERIFIED WITH: MCCARTHY PHARMD AT 0800 ON [id number] BY SJW    Culture (A)  Final    KLEBSIELLA PNEUMONIAE SUSCEPTIBILITIES TO FOLLOW Performed at [facility], [place] [address] [place], [place] [address]    Report Status PENDING  Incomplete  Blood Culture ID Panel (Reflexed)     Status: Abnormal   Collection Time: 09/15/18 12:36 PM  Result Value Ref Range Status   Enterococcus species NOT DETECTED NOT DETECTED Final   Listeria monocytogenes NOT DETECTED NOT  DETECTED Final   Staphylococcus species NOT DETECTED NOT DETECTED Final   Staphylococcus aureus (BCID) NOT DETECTED NOT DETECTED Final   Streptococcus species NOT DETECTED NOT DETECTED Final   Streptococcus agalactiae NOT DETECTED NOT DETECTED Final   Streptococcus pneumoniae NOT DETECTED NOT DETECTED Final   Streptococcus pyogenes NOT DETECTED NOT DETECTED Final   Acinetobacter baumannii NOT DETECTED NOT DETECTED Final   Enterobacteriaceae species DETECTED (A) NOT DETECTED Final    Comment: Enterobacteriaceae represent a large family of gram-negative bacteria, not a single organism. CRITICAL RESULT CALLED TO, READ BACK BY AND VERIFIED WITH: MCCARTHY PHARMD AT 0800 ON [id number] BY SJW    Enterobacter cloacae complex NOT DETECTED NOT DETECTED Final   Escherichia coli NOT DETECTED NOT DETECTED Final   Klebsiella oxytoca NOT DETECTED NOT DETECTED Final   Klebsiella pneumoniae DETECTED (A) NOT DETECTED Final    Comment: CRITICAL RESULT CALLED TO, READ BACK BY AND VERIFIED WITH: MCCARTHY PHARMD AT 0800 ON [id number] BY SJW    Proteus species NOT DETECTED NOT DETECTED Final   Serratia marcescens NOT DETECTED NOT DETECTED Final   Carbapenem resistance NOT DETECTED NOT DETECTED Final   Haemophilus influenzae NOT DETECTED NOT DETECTED Final   Neisseria meningitidis NOT DETECTED NOT DETECTED Final   Pseudomonas aeruginosa NOT DETECTED NOT DETECTED Final   Candida albicans NOT DETECTED NOT DETECTED Final   Candida glabrata NOT DETECTED NOT DETECTED Final   Candida krusei NOT DETECTED NOT DETECTED Final  Candida parapsilosis NOT DETECTED NOT DETECTED Final   Candida tropicalis NOT DETECTED NOT DETECTED Final    Comment: Performed at [facility], [place] [address] [place], [place] [address]  MRSA PCR Screening     Status: Abnormal   Collection Time: 09/16/18  3:37 AM   Specimen: Nasopharyngeal  Result Value Ref Range Status   MRSA by PCR POSITIVE (A) NEGATIVE Final    Comment:        The  GeneXpert MRSA Assay (FDA approved for NASAL specimens only), is one component of a comprehensive MRSA colonization surveillance program. It is not intended to diagnose MRSA infection nor to guide or monitor treatment for MRSA infections. RESULT CALLED TO, READ BACK BY AND VERIFIED WITH: LOFTIN,Q RN 09/16/2018 AT 1245 SKEEN,P Performed at [facility], [place] [address], [place], [place] [address]      Liver Function Tests: Recent Labs  Lab 09/15/18 1153  AST 51*  ALT 44  ALKPHOS 59  BILITOT 0.9  PROT 7.3  ALBUMIN 3.3*   No results for input(s): LIPASE, AMYLASE in the last 168 hours. No results for input(s): AMMONIA in the last 168 hours.  Cardiac Enzymes: Recent Labs  Lab 09/15/18 1153  TROPONINI <0.03   BNP (last 3 results) Recent Labs    09/15/18 1153  BNP 76.7    ProBNP (last 3 results) No results for input(s): PROBNP in the last 8760 hours.    Studies: Ct Abdomen Pelvis Wo Contrast  Result Date: 09/15/2018 CLINICAL DATA:  High grade bowel obstruction, history breast cancer, diabetes mellitus, hypertension, GERD, kidney stones EXAM: CT ABDOMEN AND PELVIS WITHOUT CONTRAST TECHNIQUE: Multidetector CT imaging of the abdomen and pelvis was performed following the standard protocol without IV contrast. Sagittal and coronal MPR images reconstructed from axial data set. No oral contrast was administered COMPARISON:  04/27/2017 FINDINGS: Lower chest: Bibasilar atelectasis and minimal RIGHT pleural effusion Hepatobiliary: Diffuse fatty infiltration of liver. Minimal dependent density in gallbladder question tiny layered calculi. No focal hepatic mass lesion. Pancreas: Atrophic pancreas without mass Spleen: Calcified granulomata.  No mass. Adrenals/Urinary Tract: Adrenal thickening without focal mass. Adjacent calculi at inferior pole RIGHT kidney, larger 8 mm diameter. Small focus of gas identified within collecting system at inferior pole RIGHT kidney, uncertain  etiology. Tiny nonobstructing calculi at inferior pole LEFT kidney. Vague area of low attenuation at upper pole LEFT kidney may represent a subtle cyst, unchanged. Kidneys otherwise unremarkable. No additional renal mass or hydronephrosis. Suprapubic catheter into decompressed urinary bladder. Stomach/Bowel: Normal appendix. Stomach decompressed. Scattered stool throughout colon, with significantly increased stool in rectum. Bowel loops otherwise normal appearance without dilatation, evidence of obstruction or wall thickening. Vascular/Lymphatic: Atherosclerotic calcifications aorta and iliac arteries without aneurysm. Reproductive: Uterus surgically absent. Question atrophic ovaries bilaterally. Other: No free air or free fluid.  No hernia. Musculoskeletal: Osseous demineralization. Loss of cortical margination of the inferior aspect of the LEFT ischium, with overlying decubitus track, suspicious for osteomyelitis. IMPRESSION: BILATERAL nonobstructing renal calculi. Small focus of gas within collecting system mid inferior pole RIGHT kidney of uncertain etiology and significance. Suspected small cyst at upper pole LEFT kidney. Markedly increased stool in rectum. Bone destruction/loss of cortical margination at inferior aspect of the LEFT ischium suspicious for osteomyelitis. Fatty infiltration of liver. Questionable cholelithiasis. Electronically Signed   By: Lavonia Dana M.D.   On: 09/15/2018 17:15   Dg Chest Port 1 View  Result Date: 09/17/2018 CLINICAL DATA:  New onset shortness of breath EXAM:  PORTABLE CHEST 1 VIEW COMPARISON:  09/15/2018 FINDINGS: There are diffuse bilateral hazy airspace opacities. The heart size is enlarged. There is no pneumothorax. No large pleural effusion. There is some elevation of the right hemidiaphragm. There is no acute osseous abnormality. There are degenerative changes of both shoulders. IMPRESSION: 1. Worsening hazy bilateral airspace opacities which can be seen in patients  with pulmonary edema versus an atypical infectious process such as viral pneumonia. 2. Borderline enlarged heart. Electronically Signed   By: Constance Holster M.D.   On: 09/17/2018 00:32    Scheduled Meds: . Chlorhexidine Gluconate Cloth  6 each Topical Q0600  . DULoxetine  120 mg Oral Daily  . ferrous sulfate  325 mg Oral QPC supper  . folic acid  1 mg Oral Daily  . furosemide  40 mg Intravenous Q12H  . heparin  5,000 Units Subcutaneous Q8H  . insulin aspart  0-15 Units Subcutaneous TID WC  . insulin glargine  10 Units Subcutaneous QHS  . levothyroxine  125 mcg Oral QAC breakfast  . metoprolol tartrate  100 mg Oral BID  . morphine  30 mg Oral BID  . multivitamin with minerals  1 tablet Oral Daily  . mupirocin ointment  1 application Nasal BID  . NIFEdipine  30 mg Oral QPC supper  . polyethylene glycol  17 g Oral BID  . pregabalin  75 mg Oral BID  . senna  2 tablet Oral QHS  . ascorbic acid  500 mg Oral Daily  . zinc sulfate  220 mg Oral Daily    Admission status: Inpatient: Based on patients clinical presentation and evaluation of above clinical data, I have made determination that patient meets Inpatient criteria at this time.   Time spent: 25 min  [facility] Pager [phone]. If 7PM-7AM, please contact night-coverage at [email or web address], Office  [phone]  password [email or web address]  09/17/2018, 2:06 PM  LOS: 2 days

## 2018-09-17 NOTE — Progress Notes (Addendum)
Shift event note: Notified by RN just after MN that pt c/o not being able to breathe w/ 02 sats of 76% when placed on NRB. Had been satting WNL on r/a prior to this. BP also extremely elevated w/ SBP of 200. RR RN and RRT were paged and responded to bedside. Per RR RN (Mindy) when she arrived at bedside pt was responsive and very restless but then became unresponsive to sternal rub with eyes open. Per RR  protocol a stat ABG and PCXR obtained. RRT placed pt on BiPAP.  At the time of this NP's arrival to bedside pt noted staring off into space but when her name was called she did turn her head to face me and appeared to track and even nodded her head when ask a question. Over the next few minutes she became more responsive attempting to pull off the BiPAP and crying out. Normal strength in BUE's. BBS w/ coarse crackles throughout. Pt given Lasix 40 mg IV. CXR findings > "Worsening hazy bilateral airspace opacities with pulmonary edema versus an atypical infectious process such as viral pneumonia. ABG > 7.01/93.2/74.1/22.6. Assessment/Plan: 1. Acute hypoxic/hypercarbic respiratory failure: Suspect a component of pulmonary edema vs PNA. Has not been febrile in 24 hours. Will hold on additional antibiotics for now. Continue Rocephin for UTI for now. Monitor effects of lasix. Continue BiPAP and repeat ABG in 2 hours. Low threshold to contact PCCM if ABG not improved or condition deteriorates further.  2. Hypertensive urgency: Suspect d/t #1 associated w/ pulmonary edema. Expect this to improve as pt diureses. Will increase LOC to progressive. Pt will remain in room, and will be  monitored closely on progressive unit.  Jeryl Columbia, NP-C [facility] Pager [phone]  CRITICAL CARE Performed by: Jeryl Columbia   Total critical care time: 90 minutes  Critical care time was exclusive of separately billable procedures and treating other patients.  Critical care was necessary to treat or  prevent imminent or life-threatening deterioration.  Critical care was time spent personally by me on the following activities: development of treatment plan with patient and/or surrogate as well as nursing, discussions with consultants, evaluation of patient's response to treatment, examination of patient, obtaining history from patient or surrogate, ordering and performing treatments and interventions, ordering and review of laboratory studies, ordering and review of radiographic studies, pulse oximetry and re-evaluation of patient's condition.  09/17/2018 @ 0230: 2nd ABG much improved > 7.35/47.1/74.2/25.6. Pt currently resting comfortably on BiPAP. Pt responsive and mentation remains at baseline. BP currently 129/73. Will attempt to wean off BiPAP as tolerated in a couple of hours.

## 2018-09-17 NOTE — Progress Notes (Signed)
Responded to rapid response call from [facility] due to decrease level on conscious and decrease in Sp02 level. Arterial blood gas obtained and results reported to Rapid Response nurse along with the start of bipap.

## 2018-09-17 NOTE — Significant Event (Addendum)
Rapid Response Event Note  Overview:Called d/t respiratory distress. Prior to arrival, pt was alert and oriented, however, with increasing irritability t/o night and paranoia. Time Called: 2354 Arrival Time: 2357 Event Type: Neurologic, Respiratory  Initial Focused Assessment: Pt sitting up in bed, moving upper extremities (pt is paraplegic), following commands, very agitated, WOB increased. HR-120s, RR-30s, SBP-200, SpO2-76% on 15L NRB mask. Lungs with crackles t/o. Once pt assessed, pt went unresponsive and eyes with fixed gaze. Respirations continued to be labored, however, SpO2 increased to 100%, SBP-190s. ABG drawn, pt placed on bipap, and PCXR done. Schorr, NP to bedside. Once NP arrived to bedside, pt began to move again, follow commands, and talk.  After a few minutes, pt very anxious, trying to take off bipap mask, and mental status back to baseline per bedside RN.  Interventions: ABG-7.01/93.2/74.1/22.6 Bipap PCXR-1. Worsening hazy bilateral airspace opacities which can be seen in patients with pulmonary edema versus an atypical infectious process such as viral pneumonia. 2. Borderline enlarged heart. 40mg  lasix IV x 1 Schorr, NP to bedside to assess pt. Pt changed to progressive care, followup ABG ordered. Plan of Care (if not transferred): Continue bipap, give 40mg  lasix IV, place pt on progressive bedside monitor. Follow-up ABG at 0200. Call RRT if further assistance needed. Event Summary: Name of Physician Notified: Schorr, NP at [place]    at          [place], Carren Rang

## 2018-09-17 NOTE — Care Management Important Message (Signed)
Important Message  Patient Details  Name: [name] MRN: [id number] Date of Birth: [birth year]   Medicare Important Message Given:       Orbie Pyo 09/17/2018, 2:39 PM

## 2018-09-17 NOTE — Consult Note (Addendum)
NAME:  [name], MRN:  [id number], DOB:  Aug 20, 1950, LOS: 2 ADMISSION DATE:  09/15/2018, CONSULTATION DATE: 6/12 REFERRING MD: 6/10, CHIEF COMPLAINT: Acute hypoxic respiratory failure  Brief History   Paraplegic 68 year old female with known history of sacral osteomyelitis and more recently extension into left ischial osteo-admitted 6/10 with sepsis due to Klebsiella bacteremia from what appeared to be urinary tract source with chronic suprapubic catheter.  Pulmonary asked to see on 6/12 for worsening respiratory distress and hypoxic respiratory failure  History of present illness   68 year old female with history as mentioned below admitted with working diagnosis of sepsis in the setting of Klebsiella bacteremia felt likely from urinary tract source and chronic suprapubic catheter.  She also has sacral osteomyelitis with more recent spread to the ischial perineum.  She is been seen by infectious disease who felt source most likely urinary tract at this point and recommended outpatient follow-up in regards to the osteomyelitis.  Her course has been complicated by what appears to be severe constipation and ileus, worsening abdominal pain, and both hypercarbic and hypoxic respiratory failure.  Pulmonary was asked to see on 6/12 as patient continued to have hypoxic respiratory failure, dyspnea, and high FiO2 requirements.  Past Medical History  Poorly controlled diabetes, left perineal ischial pressure ulcer, sacral decubitus, suprapubic catheter due to neurogenic bladder, peripheral neuropathy, hypertension, hypothyroidism, paraplegia  Significant Hospital Events   6/10: Admitted with fever lethargy and fatigue.  CT imaging demonstrated fecal impaction also osteomyelitis involving left ischium and sacral decubitus ulcer.  Admitted with working diagnosis of sepsis, culture sent, started on antibiotics.   6/11: Seen by infectious disease, preliminary blood cultures showing Klebsiella pneumonia  bacteremia with Klebsiella also growing in urine culture.  They felt suprapubic catheter likely the source.  Noted to be more short of breath, pulse oximetry 88 to 89% on 3 L.  She was desaturate when oxygen was removed.  She was placed on high flow oxygen, progressively became less responsive, arterial blood gas showed pH of 7.01, PCO2 of 93, PO2 of 74.  Placed on non-invasive positive pressure ventilation and given IV Lasix, about 2 hours after She was much more awake and interactive, PCO2 had dropped down to 47 PO2 is improved.  She had stabilized by the early hours of 6/12. 6/12: Still reporting shortness of breath, having multiple complaints primarily regarding comfort and abdominal pain.  She did get an enema earlier today reporting some results but not completely relieved.  Pulse oximetry in the low 80s on 12 L nasal cannula pulmonary asked to reevaluate.  Follow-up arterial blood gas showing adequate ventilation with pH of 7.44 and PCO2 of 41, she was more hypoxic with PO2 of 45, bicarbonate 27.5 pulse oximetry 82% we placed her back on BiPAP Consults:  Infectious disease consulted 6/11 for bacteremia felt source was likely urinary tract in origin. Wound ostomy nurse consulted 6/11 for decubitus ulcers Pulmonary critical care consult 6/12 for hypoxic respiratory failure Procedures:    Significant Diagnostic Tests:  6/10 CT abdomen pelvis:BILATERAL nonobstructing renal calculi.Small focus of gas within collecting system mid inferior pole RIGHT kidney of uncertain etiology and significance. Suspected small cyst at upper pole LEFT kidney. Markedly increased stool in rectum. Bone destruction/loss of cortical margination at inferior aspect of the LEFT ischium suspicious for osteomyelitis. Fatty infiltration of liver. Questionable cholelithiasis.   Micro Data:  Blood culture 6/10 Klebsiella pneumoniae Urine culture 6/10 Klebsiella pneumoniae pansensitive also growing E coli  Antimicrobials:   Vancomycin 6/10 Through  6/11 Cefepime 6/10 through 6/11 Ceftriaxone 6/11>>>  Interim history/subjective:  Hypoxic, reports abdominal discomfort.  Anxious and uncomfortable once awakened  Objective   Blood pressure 133/70, pulse (Abnormal) 103, temperature 97.6 F (36.4 C), temperature source Axillary, resp. rate 19, height 5\' 2"  (1.575 m), weight 100.1 kg, SpO2 90 %.        Intake/Output Summary (Last 24 hours) at 09/17/2018 1445 Last data filed at 09/17/2018 1341 Gross per 24 hour  Intake 222 ml  Output 1700 ml  Net -1478 ml   Filed Weights   09/15/18 1538 09/15/18 2237  Weight: 90.7 kg 100.1 kg    Examination: General: Chronically ill-appearing 68 year old female lying in bed does have mild exertional dyspnea HENT: Normocephalic atraumatic no jugular venous distention mucous membranes moist Lungs: Diffuse rhonchi mild accessory use pulse oximetry 86% on 12 L nasal cannula Cardiovascular: Regular rate and rhythm Abdomen: Firm, distended, hypoactive bowel sounds, painful to palpation Extremities: Chronic lower extremity edema Neuro: Awake oriented x3 moves upper wrist but not lower extremities GU: Suprapubic catheter in place  Resolved Hospital Problem list   Hypercarbic respiratory failure  Assessment & Plan:  Acute hypoxic respiratory failure.  Suspect secondary to mix of decreased abdominal compliance from ileus/constipation with resultant atelectasis also probably element of volume overload/pulmonary edema -She did have an isolated episode of hypercarbia which resolved.  It is unclear what contributed to this suspect primarily sedation and decreased ventilation from abdominal distention however she could have an element of chronic retention Plan Discontinue all narcotics 1 view abdomen looking for ileus, she may need rectal decompression with rectal tube BiPAP, will use continuously for now to help with hypoxia 80 of Lasix IV x1 Wean oxygen for saturations greater  than 90% Repeat a.m. chest x-ray  Sepsis due to klebsiella  pneumoniae bacteremia w/ klebsiella PNA UTI and E coli UTI, in setting of chronic suprapubic catheter -Now on appropriate antibiotics Plan/recommendation Continue ceftriaxone as directed by infectious disease If not already done would consider suprapubic catheter exchange Trend CBC KVO IV fluids  Fecal impaction, constipation +/- ileus  -Received enema earlier today with some improved results, she still has significant abdominal discomfort and distention this is almost certainly contributing to her hypoventilation Plan 1 view abdomen N.p.o. Ensure potassium greater than 4 May require rectal tube if significant colonic distention Agree with discontinuation of narcotics and sedating medications  Acute on chronic diastolic heart failure with pulmonary edema Plan IV Lasix, continuing Lopressor and Procardia  Diabetes with hyperglycemia Plan Continuing basal and sliding scale insulin  Hypomagnesemia Plan Replace recheck  Hypothyroidism Plan Continue replacement  Chronic decubitus ulcer including the sacrum and left ischium -Does not appear to be source of infection currently Plan Follow-up in wound clinic Continue current wound care therapy     Best practice:  Diet: N.p.o. for now Pain/Anxiety/Delirium protocol (if indicated): Not indicated VAP protocol (if indicated): Not indicated DVT prophylaxis: Subcutaneous heparin GI prophylaxis: Not indicated Glucose control: Sliding scale insulin Mobility: Advance as tolerated Code Status: Full code Family Communication: Per primary Disposition:  Acute hypoxic respiratory failure almost certainly secondary to decreased abdominal compliance with atelectasis and an element of pulmonary edema from decompensated diastolic heart failure.  At this point focus should be on diuresis, BiPAP degree increased work of breathing and treating constipation/possible ileus.  I do  not think she needs intubation at this point, however if fails BiPAP that would be the next step Labs   CBC: Recent Labs  Lab 09/15/18 1153  09/15/18 1403 09/15/18 1702 09/15/18 1739 09/17/18 0326  WBC 16.8*  --  11.4*  --  10.5  NEUTROABS 15.6*  --   --   --   --   HGB 13.9 13.6 12.5 12.2 14.0  HCT 44.1 40.0 39.6 36.0 43.1  MCV 96.7  --  98.0  --  94.7  PLT 192  --  156  --  867    Basic Metabolic Panel: Recent Labs  Lab 09/15/18 1153 09/15/18 1403 09/15/18 1702 09/15/18 1739 09/17/18 0326  NA 137 138  --  140 135  K 4.8 4.8  --  4.6 4.6  CL 100  --   --   --  101  CO2 23  --   --   --  24  GLUCOSE 270*  --   --   --  322*  BUN 30*  --   --   --  16  CREATININE 1.44*  --  1.23*  --  1.13*  CALCIUM 9.1  --   --   --  8.6*  MG  --   --  1.4*  --  1.9  PHOS  --   --  2.6  --   --    GFR: Estimated Creatinine Clearance: 51.3 mL/min (A) (by C-G formula based on SCr of 1.13 mg/dL (H)). Recent Labs  Lab 09/15/18 1153 09/15/18 1702 09/15/18 2241 09/16/18 0426 09/17/18 0326  PROCALCITON  --   --   --  18.54  --   WBC 16.8* 11.4*  --   --  10.5  LATICACIDVEN 5.5* 2.9* 2.1*  --   --     Liver Function Tests: Recent Labs  Lab 09/15/18 1153  AST 51*  ALT 44  ALKPHOS 59  BILITOT 0.9  PROT 7.3  ALBUMIN 3.3*   No results for input(s): LIPASE, AMYLASE in the last 168 hours. No results for input(s): AMMONIA in the last 168 hours.  ABG    Component Value Date/Time   PHART 7.355 09/17/2018 0210   PCO2ART 47.1 09/17/2018 0210   PO2ART 74.2 (L) 09/17/2018 0210   HCO3 25.6 09/17/2018 0210   TCO2 28 09/15/2018 1739   ACIDBASEDEF 7.3 (H) 09/17/2018 0003   O2SAT 93.9 09/17/2018 0210     Coagulation Profile: Recent Labs  Lab 09/16/18 0426  INR 1.2    Cardiac Enzymes: Recent Labs  Lab 09/15/18 1153  TROPONINI <0.03    HbA1C: Hgb A1c MFr Bld  Date/Time Value Ref Range Status  09/15/2018 10:41 PM 10.0 (H) 4.8 - 5.6 % Final    Comment:    (NOTE) Pre  diabetes:          5.7%-6.4% Diabetes:              >6.4% Glycemic control for   <7.0% adults with diabetes   04/18/2016 02:30 PM 11.7 (H) 4.8 - 5.6 % Final    Comment:    (NOTE)         Pre-diabetes: 5.7 - 6.4         Diabetes: >6.4         Glycemic control for adults with diabetes: <7.0     CBG: Recent Labs  Lab 09/16/18 1713 09/16/18 2057 09/17/18 0419 09/17/18 0846 09/17/18 1246  GLUCAP 192* 210* 287* 242* 230*    Review of Systems:   Review of Systems  Constitutional: Positive for fever and malaise/fatigue.  HENT: Negative.   Eyes: Negative.   Respiratory: Positive for shortness  of breath.   Cardiovascular: Negative.   Gastrointestinal: Positive for abdominal pain and constipation.  Genitourinary: Negative.   Musculoskeletal: Negative.   Skin: Negative.   Neurological: Negative.   Endo/Heme/Allergies: Negative.   Psychiatric/Behavioral: Negative.      Past Medical History  She,  has a past medical history of Anemia, Arthritis, Cancer of right breast ([place]) (2011), Cervical neuropathy, Diabetes mellitus without complication ([place]), Difficult intubation, GERD (gastroesophageal reflux disease), History of kidney stones, Hypercholesteremia, Hypertension, Hypothyroidism, Kidney stones (0814'G), Kidney stones (08/10/2015), Paraplegia ([place]), PONV (postoperative nausea and vomiting), and Sacral decubitus ulcer (10/2014).   Surgical History    Past Surgical History:  Procedure Laterality Date  . ABDOMINAL HYSTERECTOMY  ~ 1976  . BACK SURGERY    . BREAST BIOPSY Right 2011  . BREAST LUMPECTOMY Right 2011  . CALDWELL LUC  ~ 2003   "benign tumor removed from up under gum" (04/13/2012)  . DACROCYSTORHINOSTOMY  ~ 2000   "put stents in my tear ducts; both eyes" (04/13/2012)  . EYE SURGERY  2004   STENTS TO BIL EYES  . I&D EXTREMITY Bilateral 11/03/2014   Procedure: IRRIGATION AND DEBRIDEMENT BILATERAL HEEL;  Surgeon: Irene Limbo, MD;  Location: [facility];  Service:  Plastics;  Laterality: Bilateral;  . INCISION AND DRAINAGE OF WOUND Left 11/03/2014   Procedure: IRRIGATION AND DEBRIDEMENT SACRAL ULCER;  Surgeon: Irene Limbo, MD;  Location: [facility];  Service: Plastics;  Laterality: Left;  . JOINT REPLACEMENT    . POSTERIOR CERVICAL LAMINECTOMY  2006  . SKIN SPLIT GRAFT Left 08/05/2016   Procedure: SURGICAL PREP  FOR GRAFTING APPLICATION ACELL TO LEFT ISCHIUM;  Surgeon: Irene Limbo, MD;  Location: [facility];  Service: Plastics;  Laterality: Left;  . TONSILLECTOMY AND ADENOIDECTOMY  ~ 1954  . TOTAL KNEE ARTHROPLASTY WITH REVISION COMPONENTS  04/13/2012   Procedure: TOTAL KNEE ARTHROPLASTY WITH REVISION COMPONENTS;  Surgeon: Hessie Dibble, MD;  Location: [facility];  Service: Orthopedics;  Laterality: Left;     Social History   reports that she quit smoking about 3 years ago. Her smoking use included cigarettes. She has a 25.00 pack-year smoking history. She has never used smokeless tobacco. She reports that she does not drink alcohol or use drugs.   Family History   Her family history includes Heart disease in her father.   Allergies Allergies  Allergen Reactions  . Ivp Dye [Iodinated Diagnostic Agents] Other (See Comments)    "hot and sweaty and almost passed out"  . Aspirin Hives  . Sulfa Antibiotics Hives     Home Medications  Prior to Admission medications   Medication Sig Start Date End Date Taking? Authorizing Provider  ascorbic acid (VITAMIN C) 500 MG tablet Take 1 tablet (500 mg total) by mouth daily. 11/07/14  Yes Francesca Oman, DO  baclofen (LIORESAL) 20 MG tablet Take 20 mg by mouth every 6 (six) hours as needed for muscle spasms.    Yes [provider]  docusate sodium (COLACE) 100 MG capsule Take 100 mg by mouth 2 (two) times daily as needed (FOR CONSTIPATION).    Yes [provider]  DULoxetine (CYMBALTA) 60 MG capsule Take 120 mg by mouth daily.    Yes [provider]  ferrous sulfate 325 (65 FE) MG tablet Take  325 mg by mouth daily after supper.    Yes [provider]  folic acid (FOLVITE) 1 MG tablet Take 1 mg by mouth daily.    Yes [provider]  furosemide (LASIX) 40 MG tablet Take 40 mg by mouth daily.    Yes [provider]  levothyroxine (SYNTHROID, LEVOTHROID) 125 MCG tablet Take 1 tablet (125 mcg total) by mouth daily before breakfast. Patient taking differently: Take 125 mcg by mouth daily.  11/07/14  Yes Francesca Oman, DO  metFORMIN (GLUCOPHAGE) 500 MG tablet Take 250 mg by mouth 2 (two) times daily with a meal.   Yes [provider]  metoprolol (LOPRESSOR) 100 MG tablet Take 100 mg by mouth 2 (two) times daily.  10/28/14  Yes [provider]  Morphine Sulfate ER 30 MG T12A Take 30 mg by mouth 2 (two) times daily.   Yes [provider]  Multiple Vitamin (MULTIVITAMIN WITH MINERALS) TABS tablet Take 1 tablet by mouth daily. 11/07/14  Yes Francesca Oman, DO  NIFEdipine (PROCARDIA XL/ADALAT-CC) 30 MG 24 hr tablet Take 30 mg by mouth daily after supper.    Yes [provider]  oxyCODONE-acetaminophen (PERCOCET) 10-325 MG tablet Take 1 tablet by mouth every 6 (six) hours as needed for pain.    Yes [provider]  pregabalin (LYRICA) 75 MG capsule Take 75 mg by mouth 2 (two) times daily.  08/23/18  Yes [provider]  senna (SENOKOT) 8.6 MG tablet Take 2 tablets by mouth at bedtime.   Yes [provider]  zinc sulfate 220 MG capsule Take 1 capsule (220 mg total) by mouth daily. 11/07/14  Yes Francesca Oman, DO  ciprofloxacin (CIPRO) 500 MG tablet Take 1 tablet (500 mg total) by mouth 2 (two) times daily. Patient not taking: Reported on 09/15/2018 08/15/17   Isla Pence, MD     Critical care time: 40 min    Erick Colace ACNP-BC [place] Pager # [phone] OR # [phone] if no answer  Attending Note:  68 year old female with extensive PMH who presents to PCCM with pulmonary edema and  mixed hypercarbic/hypoxemic respiratory failure.  PCCM was consulted for respiratory failure.  ABG was performed that showed patient originally hypercarbic respiratory failure then hypoxemic.  On exam, she is mildly confused but protecting her airway.  I reviewed CXR myself, pulmonary edema noted.  Discussed with PCCM-NP.  Ok to maintain in progressive care unit for now.  We started BiPAP and with me observing her bedside within 5 minutes oxygenation has improved from 75 to 98%.  Patient needs aggressive diureses and that was contacted to the [facility]-MD and discussed with PCCM-NP.  Will keep on list to evaluate periodically but will not see unless called by primary service.  The patient is critically ill with multiple organ systems failure and requires high complexity decision making for assessment and support, frequent evaluation and titration of therapies, application of advanced monitoring technologies and extensive interpretation of multiple databases.   Critical Care Time devoted to patient care services described in this note is  40  Minutes. This time reflects time of care of this signee Dr Jennet Maduro. This critical care time does not reflect procedure time, or teaching time or supervisory time of PA/NP/Med student/Med Resident etc but could involve care discussion time.  Rush Farmer, M.D. [facility] Pulmonary/Critical Care Medicine. Pager: [phone]. After hours pager: [phone].

## 2018-09-17 NOTE — Progress Notes (Signed)
[facility] for Infectious Disease  Date of Admission:  09/15/2018      Total days of antibiotics 3  Ceftriaxone day 2          Patient [name] is a 68 y.o. female with  Principal Problem:   Bacteremia due to Klebsiella pneumoniae Active Problems:   Diabetes type 2, uncontrolled ([place])   Sepsis due to urinary tract infection ([place])   Left perineal ischial pressure ulcer   Acute respiratory failure with hypoxia and hypercapnia ([place])   . Chlorhexidine Gluconate Cloth  6 each Topical Q0600  . DULoxetine  120 mg Oral Daily  . ferrous sulfate  325 mg Oral QPC supper  . folic acid  1 mg Oral Daily  . furosemide  40 mg Intravenous Q12H  . heparin  5,000 Units Subcutaneous Q8H  . insulin aspart  0-15 Units Subcutaneous TID WC  . insulin glargine  10 Units Subcutaneous QHS  . levothyroxine  125 mcg Oral QAC breakfast  . metoprolol tartrate  100 mg Oral BID  . morphine  30 mg Oral BID  . multivitamin with minerals  1 tablet Oral Daily  . mupirocin ointment  1 application Nasal BID  . NIFEdipine  30 mg Oral QPC supper  . polyethylene glycol  17 g Oral BID  . pregabalin  75 mg Oral BID  . senna  2 tablet Oral QHS  . ascorbic acid  500 mg Oral Daily  . zinc sulfate  220 mg Oral Daily    SUBJECTIVE: Feels awful today. Had a hard time last night breathing suddenly. She says that she usually requires lasix daily at home and has not been getting this in the hospital up until last night. She had a BM yesterday but only "partial relief" and was thankful her miralax was started back. No fevers/chills noted overnight.   Interval history noted for Rapid Response call d/t acute hypoxic and hypercarbic respiratory failure in the setting of severe hypertension (SBP 200s). Required BiPAP over night with CXR indicating worsened hazy b/l airspace opacities, pulmonary edema vs atypical viral pneumonia.    Review of Systems: Review of Systems  Constitutional: Negative for  chills and fever.  Respiratory: Positive for shortness of breath. Negative for cough and sputum production.   Cardiovascular: Negative for chest pain and leg swelling.  Gastrointestinal: Positive for abdominal pain and constipation. Negative for diarrhea, nausea and vomiting.  Genitourinary: Negative for dysuria.       Chronic suprapubic catheter  Skin: Negative for rash.  Neurological: Negative for headaches.    Allergies  Allergen Reactions  . Ivp Dye [Iodinated Diagnostic Agents] Other (See Comments)    "hot and sweaty and almost passed out"  . Aspirin Hives  . Sulfa Antibiotics Hives    OBJECTIVE: Vitals:   09/17/18 0614 09/17/18 0808 09/17/18 0926 09/17/18 1018  BP: 119/81   133/70  Pulse: 99 (!) 102  (!) 103  Resp: 13 19    Temp:      TempSrc:      SpO2: 96% 98% 90%   Weight:      Height:       Body mass index is 40.36 kg/m.  Physical Exam Constitutional:      General: She is not in acute distress.    Comments: Sitting upright in bed. Complaining of neck pain as she cannot fully extend back d/t hardware. A&O x 3  HENT:     Mouth/Throat:  Mouth: Mucous membranes are moist.     Pharynx: Oropharynx is clear.  Eyes:     General: No scleral icterus.    Conjunctiva/sclera: Conjunctivae normal.  Cardiovascular:     Rate and Rhythm: Normal rate and regular rhythm.     Heart sounds: No murmur.  Pulmonary:     Breath sounds: Rhonchi and rales present.  Abdominal:     General: There is distension.     Tenderness: There is no abdominal tenderness.  Musculoskeletal:        General: No swelling.  Skin:    General: Skin is warm and dry.     Capillary Refill: Capillary refill takes less than 2 seconds.     Comments: Did not assess sacral wound today.   Neurological:     Mental Status: She is oriented to person, place, and time.     Lab Results Lab Results  Component Value Date   WBC 10.5 09/17/2018   HGB 14.0 09/17/2018   HCT 43.1 09/17/2018   MCV 94.7  09/17/2018   PLT 191 09/17/2018    Lab Results  Component Value Date   CREATININE 1.13 (H) 09/17/2018   BUN 16 09/17/2018   NA 135 09/17/2018   K 4.6 09/17/2018   CL 101 09/17/2018   CO2 24 09/17/2018    Lab Results  Component Value Date   ALT 44 09/15/2018   AST 51 (H) 09/15/2018   ALKPHOS 59 09/15/2018   BILITOT 0.9 09/15/2018     Microbiology: Recent Results (from the past 240 hour(s))  Urine culture     Status: Abnormal (Preliminary result)   Collection Time: 09/15/18 11:53 AM   Specimen: Urine, Random  Result Value Ref Range Status   Specimen Description URINE, RANDOM  Final   Special Requests   Final    NONE Performed at [facility], [place] [address] [place], [place] [address]    Culture (A)  Final    >=100,000 COLONIES/mL KLEBSIELLA PNEUMONIAE >=100,000 COLONIES/mL ESCHERICHIA COLI    Report Status PENDING  Incomplete   Organism ID, Bacteria KLEBSIELLA PNEUMONIAE (A)  Final      Susceptibility   Klebsiella pneumoniae - MIC*    AMPICILLIN >=32 RESISTANT Resistant     CEFAZOLIN <=4 SENSITIVE Sensitive     CEFTRIAXONE <=1 SENSITIVE Sensitive     CIPROFLOXACIN <=0.25 SENSITIVE Sensitive     GENTAMICIN <=1 SENSITIVE Sensitive     IMIPENEM <=0.25 SENSITIVE Sensitive     NITROFURANTOIN 128 RESISTANT Resistant     TRIMETH/SULFA <=20 SENSITIVE Sensitive     AMPICILLIN/SULBACTAM >=32 RESISTANT Resistant     PIP/TAZO <=4 SENSITIVE Sensitive     Extended ESBL NEGATIVE Sensitive     * >=100,000 COLONIES/mL KLEBSIELLA PNEUMONIAE  SARS Coronavirus 2 (CEPHEID- Performed in [facility] lab), Hosp Order     Status: None   Collection Time: 09/15/18 11:54 AM   Specimen: Nasopharyngeal Swab  Result Value Ref Range Status   SARS Coronavirus 2 NEGATIVE NEGATIVE Final    Comment: (NOTE) If result is NEGATIVE SARS-CoV-2 target nucleic acids are NOT DETECTED. The SARS-CoV-2 RNA is generally detectable in upper and lower  respiratory specimens during the acute  phase of infection. The lowest  concentration of SARS-CoV-2 viral copies this assay can detect is 250  copies / mL. A negative result does not preclude SARS-CoV-2 infection  and should not be used as the sole basis for treatment or other  patient management decisions.  A negative  result may occur with  improper specimen collection / handling, submission of specimen other  than nasopharyngeal swab, presence of viral mutation(s) within the  areas targeted by this assay, and inadequate number of viral copies  (<250 copies / mL). A negative result must be combined with clinical  observations, patient history, and epidemiological information. If result is POSITIVE SARS-CoV-2 target nucleic acids are DETECTED. The SARS-CoV-2 RNA is generally detectable in upper and lower  respiratory specimens dur ing the acute phase of infection.  Positive  results are indicative of active infection with SARS-CoV-2.  Clinical  correlation with patient history and other diagnostic information is  necessary to determine patient infection status.  Positive results do  not rule out bacterial infection or co-infection with other viruses. If result is PRESUMPTIVE POSTIVE SARS-CoV-2 nucleic acids MAY BE PRESENT.   A presumptive positive result was obtained on the submitted specimen  and confirmed on repeat testing.  While 2019 novel coronavirus  (SARS-CoV-2) nucleic acids may be present in the submitted sample  additional confirmatory testing may be necessary for epidemiological  and / or clinical management purposes  to differentiate between  SARS-CoV-2 and other Sarbecovirus currently known to infect humans.  If clinically indicated additional testing with an alternate test  methodology [phone]) is advised. The SARS-CoV-2 RNA is generally  detectable in upper and lower respiratory sp ecimens during the acute  phase of infection. The expected result is Negative. Fact Sheet for Patients:   StrictlyIdeas.no Fact Sheet for Healthcare Providers: BankingDealers.co.za This test is not yet approved or cleared by the [place] FDA and has been authorized for detection and/or diagnosis of SARS-CoV-2 by FDA under an Emergency Use Authorization (EUA).  This EUA will remain in effect (meaning this test can be used) for the duration of the COVID-19 declaration under Section 564(b)(1) of the Act, 21 U.S.C. section 360bbb-3(b)(1), unless the authorization is terminated or revoked sooner. Performed at [facility], [place] [address] [place], [place] [address]   Blood Culture (routine x 2)     Status: None (Preliminary result)   Collection Time: 09/15/18 11:58 AM   Specimen: BLOOD  Result Value Ref Range Status   Specimen Description BLOOD BLOOD RIGHT FOREARM  Final   Special Requests   Final    BOTTLES DRAWN AEROBIC AND ANAEROBIC Blood Culture adequate volume   Culture  Setup Time   Final    GRAM NEGATIVE RODS AEROBIC BOTTLE ONLY CRITICAL VALUE NOTED.  VALUE IS CONSISTENT WITH PREVIOUSLY REPORTED AND CALLED VALUE. Performed at [facility], [place] [address] [place], [place] [address]    Culture GRAM NEGATIVE RODS  Final   Report Status PENDING  Incomplete  Blood Culture (routine x 2)     Status: Abnormal (Preliminary result)   Collection Time: 09/15/18 12:36 PM   Specimen: BLOOD  Result Value Ref Range Status   Specimen Description BLOOD SITE NOT SPECIFIED  Final   Special Requests   Final    BOTTLES DRAWN AEROBIC AND ANAEROBIC Blood Culture results may not be optimal due to an inadequate volume of blood received in culture bottles   Culture  Setup Time   Final    IN BOTH AEROBIC AND ANAEROBIC BOTTLES Organism ID to follow GRAM NEGATIVE RODS CRITICAL RESULT CALLED TO, READ BACK BY AND VERIFIED WITH: MCCARTHY PHARMD AT 0800 ON [id number] BY SJW    Culture (A)  Final    KLEBSIELLA PNEUMONIAE SUSCEPTIBILITIES TO FOLLOW  Performed at [facility], [place] [address] [place], [place] [address]    Report Status PENDING  Incomplete  Blood Culture ID Panel (Reflexed)     Status: Abnormal   Collection Time: 09/15/18 12:36 PM  Result Value Ref Range Status   Enterococcus species NOT DETECTED NOT DETECTED Final   Listeria monocytogenes NOT DETECTED NOT DETECTED Final   Staphylococcus species NOT DETECTED NOT DETECTED Final   Staphylococcus aureus (BCID) NOT DETECTED NOT DETECTED Final   Streptococcus species NOT DETECTED NOT DETECTED Final   Streptococcus agalactiae NOT DETECTED NOT DETECTED Final   Streptococcus pneumoniae NOT DETECTED NOT DETECTED Final   Streptococcus pyogenes NOT DETECTED NOT DETECTED Final   Acinetobacter baumannii NOT DETECTED NOT DETECTED Final   Enterobacteriaceae species DETECTED (A) NOT DETECTED Final    Comment: Enterobacteriaceae represent a large family of gram-negative bacteria, not a single organism. CRITICAL RESULT CALLED TO, READ BACK BY AND VERIFIED WITH: MCCARTHY PHARMD AT 0800 ON [id number] BY SJW    Enterobacter cloacae complex NOT DETECTED NOT DETECTED Final   Escherichia coli NOT DETECTED NOT DETECTED Final   Klebsiella oxytoca NOT DETECTED NOT DETECTED Final   Klebsiella pneumoniae DETECTED (A) NOT DETECTED Final    Comment: CRITICAL RESULT CALLED TO, READ BACK BY AND VERIFIED WITH: MCCARTHY PHARMD AT 0800 ON [id number] BY SJW    Proteus species NOT DETECTED NOT DETECTED Final   Serratia marcescens NOT DETECTED NOT DETECTED Final   Carbapenem resistance NOT DETECTED NOT DETECTED Final   Haemophilus influenzae NOT DETECTED NOT DETECTED Final   Neisseria meningitidis NOT DETECTED NOT DETECTED Final   Pseudomonas aeruginosa NOT DETECTED NOT DETECTED Final   Candida albicans NOT DETECTED NOT DETECTED Final   Candida glabrata NOT DETECTED NOT DETECTED Final   Candida krusei NOT DETECTED NOT DETECTED Final   Candida parapsilosis NOT DETECTED NOT DETECTED Final   Candida  tropicalis NOT DETECTED NOT DETECTED Final    Comment: Performed at [facility], [address] [place], [place] [address]  MRSA PCR Screening     Status: Abnormal   Collection Time: 09/16/18  3:37 AM   Specimen: Nasopharyngeal  Result Value Ref Range Status   MRSA by PCR POSITIVE (A) NEGATIVE Final    Comment:        The GeneXpert MRSA Assay (FDA approved for NASAL specimens only), is one component of a comprehensive MRSA colonization surveillance program. It is not intended to diagnose MRSA infection nor to guide or monitor treatment for MRSA infections. RESULT CALLED TO, READ BACK BY AND VERIFIED WITH: LOFTIN,Q RN 09/16/2018 AT 6440 SKEEN,P Performed at [facility], [place] [address], [place], [place] [address]     Assessment & Plan:  1. Klebsiella Bacteremia = Sensitivities pending for blood isolate but suspect the same isolate. Continue ceftriaxone IV for now with likely transition to oral therapy once sensitivities return and barring she does well.   2. Urinary Tract Infection = klebsiella species in urine sensitive to Ceftriaxone. Suspect the same isolate in blood - she has improved on therapy and will continue ceftriaxone.   3. Acute Respiratory Event = likely pulmonary edema component given acute onset and response with lasix.  4. Ischial Decub = follow up with wound clinic after discharge. Monitor for acute signs of infection.   Janene Madeira, MSN, NP-C [facility] for Infectious [place] Pager: [phone]  09/17/2018  12:43 PM

## 2018-09-17 NOTE — Progress Notes (Signed)
Inpatient Diabetes Program Recommendations  AACE/ADA: New Consensus Statement on Inpatient Glycemic Control (2015)  Target Ranges:  Prepandial:   less than 140 mg/dL      Peak postprandial:   less than 180 mg/dL (1-2 hours)      Critically ill patients:  140 - 180 mg/dL   Results for [name], [name] (MRN [id number]) as of 09/17/2018 11:29  Ref. Range 09/16/2018 11:45 09/16/2018 17:13 09/16/2018 20:57 09/17/2018 04:19 09/17/2018 08:46  Glucose-Capillary Latest Ref Range: 70 - 99 mg/dL 229 (H) 192 (H) 210 (H) 287 (H) 242 (H)   Review of Glycemic Control  Diabetes history: DM 2 Outpatient Diabetes medications: Metformin 250 mg BID Current orders for Inpatient glycemic control: Novolog 0-15 tid, Lantus 10 units  A1c 10%  Glucose trends in the high 200 range after Lantus 10 units. Consider increasing Lantus to 18 units.  Thanks, Tama Headings RN, MSN, BC-ADM Inpatient Diabetes Coordinator Team Pager [phone] (8a-5p)

## 2018-09-17 NOTE — Care Management Important Message (Signed)
Important Message  Patient Details  Name: [name] MRN: [id number] Date of Birth: December 07, 1950   Medicare Important Message Given:  No CORRECTION NO IM GIVEN PATIENT HAS A PRECAUTION IN PLACE   [name] [place] 09/17/2018, 2:40 PM

## 2018-09-17 NOTE — Progress Notes (Signed)
Initial Nutrition Assessment  DOCUMENTATION CODES:   Morbid obesity  INTERVENTION:   -Continue MVI with minerals daily -Continue 500 mg vitamin C daily -Continue 220 mg zinc sulfate daily -Ensure Max po daily, each supplement provides 150 kcal and 30 grams of protein -30 ml Prostat TID, each supplement provides 100 kcals and 15 grams protein  NUTRITION DIAGNOSIS:   Increased nutrient needs related to wound healing as evidenced by estimated needs.  GOAL:   Patient will meet greater than or equal to 90% of their needs  MONITOR:   PO intake, Supplement acceptance, Labs, Weight trends, Skin, I & O's  REASON FOR ASSESSMENT:        ASSESSMENT:   The patient is a 68 yr old woman who was sent to the ED from home where she resides for the above chief complaints. She has a past medical history significant for DM II, Anemia, peripheral neuropathy, GERD, Hypertension, hypothyroidism, Paraplegia, Sacral decubitus ulcer, hyperlipidemia, and kidney stones. She has a suprapubic catheter due to neurogenic bladder.  Pt admitted with sepsis due to UTI (Klebsiella bactermia and E. Coli found in urine culture.   Reviewed I/O's: -800 ml x 24 hours and -993 ml since admission  UOP: 800 ml x 24 hours  Attempted to speak with on phone to obtain further history, however, no answer.  Pt with poor appetite; noted meal completion 25%.   Per review of wt records, wt has been stable over the past year.   Pt would benefit from addition of nutritional supplements to help support wound healing. Pt does not like milky supplements, such as Ensure, and prefers Boost Breeze, however, hesitant to provide [employer] with high carbohydrate content and elevated CBGS (likely due to infection)  Lab Results  Component Value Date   HGBA1C 10.0 (H) 09/15/2018   PTA DM medications are 250 mg metformin BID.   Labs reviewed: CBGS: 230-287 (inpatient orders for glycemic control are 0-15 units insulin aspart TID  with meals and 15 units insulin glargine q HS).   NUTRITION - FOCUSED PHYSICAL EXAM:    Most Recent Value  Orbital Region  Unable to assess  Upper Arm Region  Unable to assess  Thoracic and Lumbar Region  Unable to assess  Buccal Region  Unable to assess  Temple Region  Unable to assess  Clavicle Bone Region  Unable to assess  Clavicle and Acromion Bone Region  Unable to assess  Scapular Bone Region  Unable to assess  Dorsal Hand  Unable to assess  Patellar Region  Unable to assess  Anterior Thigh Region  Unable to assess  Edema (RD Assessment)  Unable to assess  Hair  Unable to assess  Eyes  Unable to assess  Mouth  Unable to assess  Skin  Unable to assess  Nails  Unable to assess       Diet Order:   Diet Order            Diet Carb Modified Fluid consistency: Thin; Room service appropriate? No  Diet effective now              EDUCATION NEEDS:   No education needs have been identified at this time  Skin:  Skin Assessment: Skin Integrity Issues: Skin Integrity Issues:: Other (Comment), Stage II, Stage IV Stage II: lt ischial tuberosity Stage IV: lt ischial tuberosity Other: lt ankle blister  Last BM:  09/16/18  Height:   Ht Readings from Last 1 Encounters:  09/15/18 5\' 2"  (1.575 m)  Weight:   Wt Readings from Last 1 Encounters:  09/15/18 100.1 kg    Ideal Body Weight:  46 kg(adjusted for paraplegia)  BMI:  Body mass index is 40.36 kg/m.  Estimated Nutritional Needs:   Kcal:  1600-1800  Protein:  100-115 grams  Fluid:  > 1.6 L    Inari Shin A. Jimmye Norman, RD, LDN, [place] Registered Dietitian II Certified Diabetes Care and Education Specialist Pager: [phone] After hours Pager: [phone]

## 2018-09-18 DIAGNOSIS — L89209 Pressure ulcer of unspecified hip, unspecified stage: Secondary | ICD-10-CM

## 2018-09-18 LAB — BASIC METABOLIC PANEL
Anion gap: 15 (ref 5–15)
BUN: 14 mg/dL (ref 8–23)
CO2: 19 mmol/L — ABNORMAL LOW (ref 22–32)
Calcium: 8.5 mg/dL — ABNORMAL LOW (ref 8.9–10.3)
Chloride: 104 mmol/L (ref 98–111)
Creatinine, Ser: 0.87 mg/dL (ref 0.44–1.00)
GFR calc Af Amer: >60 mL/min (ref >60)
GFR calc non Af Amer: >60 mL/min (ref >60)
Glucose, Bld: 207 mg/dL — ABNORMAL HIGH (ref 70–99)
Potassium: 4.6 mmol/L (ref 3.5–5.1)
Sodium: 138 mmol/L (ref 135–145)

## 2018-09-18 LAB — CULTURE, BLOOD (ROUTINE X 2): Special Requests: ADEQUATE

## 2018-09-18 LAB — URINE CULTURE: Culture: >=100000 — AB

## 2018-09-18 LAB — CBC
HCT: 42.8 % (ref 36.0–46.0)
Hemoglobin: 14.1 g/dL (ref 12.0–15.0)
MCH: 30.6 pg (ref 26.0–34.0)
MCHC: 32.9 g/dL (ref 30.0–36.0)
MCV: 92.8 fL (ref 80.0–100.0)
Platelets: 216 10*3/uL (ref 150–400)
RBC: 4.61 MIL/uL (ref 3.87–5.11)
RDW: 13.4 % (ref 11.5–15.5)
WBC: 10.1 10*3/uL (ref 4.0–10.5)
nRBC: 0 % (ref 0.0–0.2)

## 2018-09-18 LAB — GLUCOSE, CAPILLARY
Glucose-Capillary: 188 mg/dL — ABNORMAL HIGH (ref 70–99)
Glucose-Capillary: 194 mg/dL — ABNORMAL HIGH (ref 70–99)
Glucose-Capillary: 198 mg/dL — ABNORMAL HIGH (ref 70–99)

## 2018-09-18 MED ORDER — LORAZEPAM 2 MG/ML IJ SOLN
0.5000 mg | Freq: Once | INTRAMUSCULAR | Status: AC
  Administered 2018-09-18: 0.5 mg via INTRAVENOUS
  Filled 2018-09-18: qty 1

## 2018-09-18 MED ORDER — SODIUM CHLORIDE 0.9 % IV SOLN
INTRAVENOUS | Status: DC | PRN
  Administered 2018-09-18 – 2018-09-19 (×2): 250 mL via INTRAVENOUS

## 2018-09-18 MED ORDER — MORPHINE SULFATE (PF) 2 MG/ML IV SOLN
2.0000 mg | Freq: Once | INTRAVENOUS | Status: AC
  Administered 2018-09-18: 2 mg via INTRAVENOUS
  Filled 2018-09-18: qty 1

## 2018-09-18 NOTE — Progress Notes (Signed)
[facility]   Reason for visit: Follow up on bacteremia  Interval History: sensitivities noted of Klebsiella and E coli.  Just Klebsiella in blood.  No fever, WBC yesterday wnl.  No new acute events.  Day 4 total antibiotics, day 3 ceftriaxone  Physical Exam: Constitutional:  Vitals:   09/18/18 1131 09/18/18 1132  BP: (!) 159/97 (!) 159/97  Pulse: (!) 105 (!) 105  Resp: (!) 26 (!) 24  Temp:    SpO2: 95% 97%   patient appears in NAD Respiratory: Normal respiratory effort; CTA B Cardiovascular: RRR GI: soft, nt, nd  Review of Systems: Constitutional: negative for chills Gastrointestinal: negative for diarrhea  Lab Results  Component Value Date   WBC 10.5 09/17/2018   HGB 14.0 09/17/2018   HCT 43.1 09/17/2018   MCV 94.7 09/17/2018   PLT 191 09/17/2018    Lab Results  Component Value Date   CREATININE 1.13 (H) 09/17/2018   BUN 16 09/17/2018   NA 135 09/17/2018   K 4.6 09/17/2018   CL 101 09/17/2018   CO2 24 09/17/2018    Lab Results  Component Value Date   ALT 44 09/15/2018   AST 51 (H) 09/15/2018   ALKPHOS 59 09/15/2018     Microbiology: Recent Results (from the past 240 hour(s))  Urine culture     Status: Abnormal   Collection Time: 09/15/18 11:53 AM   Specimen: Urine, Random  Result Value Ref Range Status   Specimen Description URINE, RANDOM  Final   Special Requests   Final    NONE Performed at [facility], [address], [place], [place] [address]    Culture (A)  Final    >=100,000 COLONIES/mL KLEBSIELLA PNEUMONIAE >=100,000 COLONIES/mL ESCHERICHIA COLI    Report Status 09/18/2018 FINAL  Final   Organism ID, Bacteria KLEBSIELLA PNEUMONIAE (A)  Final   Organism ID, Bacteria ESCHERICHIA COLI (A)  Final      Susceptibility   Escherichia coli - MIC*    AMPICILLIN <=2 SENSITIVE Sensitive     CEFAZOLIN <=4 SENSITIVE Sensitive     CEFTRIAXONE <=1 SENSITIVE Sensitive     CIPROFLOXACIN <=0.25 SENSITIVE Sensitive      GENTAMICIN <=1 SENSITIVE Sensitive     IMIPENEM <=0.25 SENSITIVE Sensitive     NITROFURANTOIN <=16 SENSITIVE Sensitive     TRIMETH/SULFA <=20 SENSITIVE Sensitive     AMPICILLIN/SULBACTAM <=2 SENSITIVE Sensitive     PIP/TAZO <=4 SENSITIVE Sensitive     Extended ESBL NEGATIVE Sensitive     * >=100,000 COLONIES/mL ESCHERICHIA COLI   Klebsiella pneumoniae - MIC*    AMPICILLIN >=32 RESISTANT Resistant     CEFAZOLIN <=4 SENSITIVE Sensitive     CEFTRIAXONE <=1 SENSITIVE Sensitive     CIPROFLOXACIN <=0.25 SENSITIVE Sensitive     GENTAMICIN <=1 SENSITIVE Sensitive     IMIPENEM <=0.25 SENSITIVE Sensitive     NITROFURANTOIN 128 RESISTANT Resistant     TRIMETH/SULFA <=20 SENSITIVE Sensitive     AMPICILLIN/SULBACTAM >=32 RESISTANT Resistant     PIP/TAZO <=4 SENSITIVE Sensitive     Extended ESBL NEGATIVE Sensitive     * >=100,000 COLONIES/mL KLEBSIELLA PNEUMONIAE  SARS Coronavirus 2 (CEPHEID- Performed in [facility] lab), Hosp Order     Status: None   Collection Time: 09/15/18 11:54 AM   Specimen: Nasopharyngeal Swab  Result Value Ref Range Status   SARS Coronavirus 2 NEGATIVE NEGATIVE Final    Comment: (NOTE) If result is NEGATIVE SARS-CoV-2 target  nucleic acids are NOT DETECTED. The SARS-CoV-2 RNA is generally detectable in upper and lower  respiratory specimens during the acute phase of infection. The lowest  concentration of SARS-CoV-2 viral copies this assay can detect is 250  copies / mL. A negative result does not preclude SARS-CoV-2 infection  and should not be used as the sole basis for treatment or other  patient management decisions.  A negative result may occur with  improper specimen collection / handling, submission of specimen other  than nasopharyngeal swab, presence of viral mutation(s) within the  areas targeted by this assay, and inadequate number of viral copies  (<250 copies / mL). A negative result must be combined with clinical  observations, patient  history, and epidemiological information. If result is POSITIVE SARS-CoV-2 target nucleic acids are DETECTED. The SARS-CoV-2 RNA is generally detectable in upper and lower  respiratory specimens dur ing the acute phase of infection.  Positive  results are indicative of active infection with SARS-CoV-2.  Clinical  correlation with patient history and other diagnostic information is  necessary to determine patient infection status.  Positive results do  not rule out bacterial infection or co-infection with other viruses. If result is PRESUMPTIVE POSTIVE SARS-CoV-2 nucleic acids MAY BE PRESENT.   A presumptive positive result was obtained on the submitted specimen  and confirmed on repeat testing.  While 2019 novel coronavirus  (SARS-CoV-2) nucleic acids may be present in the submitted sample  additional confirmatory testing may be necessary for epidemiological  and / or clinical management purposes  to differentiate between  SARS-CoV-2 and other Sarbecovirus currently known to infect humans.  If clinically indicated additional testing with an alternate test  methodology [phone]) is advised. The SARS-CoV-2 RNA is generally  detectable in upper and lower respiratory sp ecimens during the acute  phase of infection. The expected result is Negative. Fact Sheet for Patients:  StrictlyIdeas.no Fact Sheet for Healthcare Providers: BankingDealers.co.za This test is not yet approved or cleared by the [place] FDA and has been authorized for detection and/or diagnosis of SARS-CoV-2 by FDA under an Emergency Use Authorization (EUA).  This EUA will remain in effect (meaning this test can be used) for the duration of the COVID-19 declaration under Section 564(b)(1) of the Act, 21 U.S.C. section 360bbb-3(b)(1), unless the authorization is terminated or revoked sooner. Performed at [facility], [place] [address] [name], [place] [address]    Blood Culture (routine x 2)     Status: Abnormal   Collection Time: 09/15/18 11:58 AM   Specimen: BLOOD  Result Value Ref Range Status   Specimen Description BLOOD BLOOD RIGHT FOREARM  Final   Special Requests   Final    BOTTLES DRAWN AEROBIC AND ANAEROBIC Blood Culture adequate volume   Culture  Setup Time   Final    GRAM NEGATIVE RODS AEROBIC BOTTLE ONLY CRITICAL VALUE NOTED.  VALUE IS CONSISTENT WITH PREVIOUSLY REPORTED AND CALLED VALUE.    Culture (A)  Final    KLEBSIELLA PNEUMONIAE SUSCEPTIBILITIES PERFORMED ON PREVIOUS CULTURE WITHIN THE LAST 5 DAYS. Performed at [facility], [place] [address], [place], [place] [address]    Report Status 09/18/2018 FINAL  Final  Blood Culture (routine x 2)     Status: Abnormal   Collection Time: 09/15/18 12:36 PM   Specimen: BLOOD  Result Value Ref Range Status   Specimen Description BLOOD SITE NOT SPECIFIED  Final   Special Requests   Final    BOTTLES DRAWN AEROBIC  AND ANAEROBIC Blood Culture results may not be optimal due to an inadequate volume of blood received in culture bottles   Culture  Setup Time   Final    IN BOTH AEROBIC AND ANAEROBIC BOTTLES Organism ID to follow GRAM NEGATIVE RODS CRITICAL RESULT CALLED TO, READ BACK BY AND VERIFIED WITH: MCCARTHY PHARMD AT 0800 ON [id number] BY SJW Performed at [facility], [place] [address] [place], [place] [address]    Culture KLEBSIELLA PNEUMONIAE (A)  Final   Report Status 09/18/2018 FINAL  Final   Organism ID, Bacteria KLEBSIELLA PNEUMONIAE  Final      Susceptibility   Klebsiella pneumoniae - MIC*    AMPICILLIN >=32 RESISTANT Resistant     CEFAZOLIN <=4 SENSITIVE Sensitive     CEFEPIME <=1 SENSITIVE Sensitive     CEFTAZIDIME <=1 SENSITIVE Sensitive     CEFTRIAXONE <=1 SENSITIVE Sensitive     CIPROFLOXACIN <=0.25 SENSITIVE Sensitive     GENTAMICIN <=1 SENSITIVE Sensitive     IMIPENEM 0.5 SENSITIVE Sensitive     TRIMETH/SULFA <=20 SENSITIVE Sensitive     AMPICILLIN/SULBACTAM  >=32 RESISTANT Resistant     PIP/TAZO 8 SENSITIVE Sensitive     Extended ESBL NEGATIVE Sensitive     * KLEBSIELLA PNEUMONIAE  Blood Culture ID Panel (Reflexed)     Status: Abnormal   Collection Time: 09/15/18 12:36 PM  Result Value Ref Range Status   Enterococcus species NOT DETECTED NOT DETECTED Final   Listeria monocytogenes NOT DETECTED NOT DETECTED Final   Staphylococcus species NOT DETECTED NOT DETECTED Final   Staphylococcus aureus (BCID) NOT DETECTED NOT DETECTED Final   Streptococcus species NOT DETECTED NOT DETECTED Final   Streptococcus agalactiae NOT DETECTED NOT DETECTED Final   Streptococcus pneumoniae NOT DETECTED NOT DETECTED Final   Streptococcus pyogenes NOT DETECTED NOT DETECTED Final   Acinetobacter baumannii NOT DETECTED NOT DETECTED Final   Enterobacteriaceae species DETECTED (A) NOT DETECTED Final    Comment: Enterobacteriaceae represent a large family of gram-negative bacteria, not a single organism. CRITICAL RESULT CALLED TO, READ BACK BY AND VERIFIED WITH: MCCARTHY PHARMD AT 0800 ON [id number] BY SJW    Enterobacter cloacae complex NOT DETECTED NOT DETECTED Final   Escherichia coli NOT DETECTED NOT DETECTED Final   Klebsiella oxytoca NOT DETECTED NOT DETECTED Final   Klebsiella pneumoniae DETECTED (A) NOT DETECTED Final    Comment: CRITICAL RESULT CALLED TO, READ BACK BY AND VERIFIED WITH: MCCARTHY PHARMD AT 0800 ON [id number] BY SJW    Proteus species NOT DETECTED NOT DETECTED Final   Serratia marcescens NOT DETECTED NOT DETECTED Final   Carbapenem resistance NOT DETECTED NOT DETECTED Final   Haemophilus influenzae NOT DETECTED NOT DETECTED Final   Neisseria meningitidis NOT DETECTED NOT DETECTED Final   Pseudomonas aeruginosa NOT DETECTED NOT DETECTED Final   Candida albicans NOT DETECTED NOT DETECTED Final   Candida glabrata NOT DETECTED NOT DETECTED Final   Candida krusei NOT DETECTED NOT DETECTED Final   Candida parapsilosis NOT DETECTED NOT DETECTED Final    Candida tropicalis NOT DETECTED NOT DETECTED Final    Comment: Performed at [facility], [address], [place], [place] [address]  MRSA PCR Screening     Status: Abnormal   Collection Time: 09/16/18  3:37 AM   Specimen: Nasopharyngeal  Result Value Ref Range Status   MRSA by PCR POSITIVE (A) NEGATIVE Final    Comment:        The GeneXpert MRSA Assay (FDA approved for  NASAL specimens only), is one component of a comprehensive MRSA colonization surveillance program. It is not intended to diagnose MRSA infection nor to guide or monitor treatment for MRSA infections. RESULT CALLED TO, READ BACK BY AND VERIFIED WITH: LOFTIN,Q RN 09/16/2018 AT 1836 SKEEN,P Performed at [facility], [place] [address], [place],  [address]     Impression/Plan:  1. Klebsiella bacteremia - urinary source with catheter in place.  On ceftriaxone and would treat for 7 days total through June 16th.  Can use Keflex 500 mg three times a day at discharge.  I doubt significance of E coli but covered regardless.    2. Ischial decubitus ulcer - may benefit with reestablishing with the wound care center after discharge.  No signs of osteomyelitis clinically.  Wound has improved.  No purulence.    I will sign off, call with any questions.

## 2018-09-18 NOTE — Progress Notes (Addendum)
0745 Pt on continuous BiPAP. Took pt off and placed on nasal canula so that pt could take a sip of water and wipe her mouth. Pt tachypnic, shallow - sats down to 86% quickly without BiPAP. On BiPAP, pt is maintaining 96-98%.  Pt alert and oriented x4.  0945 Pt taken off BiPAP for pills, gradually dropped sats down to 88%, especially when talking, even with 7L [place]. Increased work of breathing, placed back on BiPAP.   Pt notes significant pain - she is upset that she can only have Baclofen every 6 hours. Tried to educate about fecal retention and opioid pain medication. Pt notes that she usually has regular bowel movements with po sennakot and colace. Refused Miralax as she states it is not working.  1130 Pt continues to desat to 80s on nasal canula with increased work of breathing. On BiPAP, she has regular respirations. Refused ProStat and Ensure Max, stated it tasted too bad and she could not drink them. I attempted to educate patient on need for dense nutrition for increased work of breathing and fighting infection; pt continued to refuse. Will continue to monitor.  1330 Pt encouraged to eat a few bites of lunch: only interested in a bite of chocolate pudding. Encouraged pt, she also took a few sips of diet Sprite. Pt continues to desat to the low 80s on nasal canula but comes back up quickly once back on BiPAP. Pt did not have increased work of breathing with nasal canula this time. Discussed continued plan of care with patient. Heels floated.

## 2018-09-18 NOTE — Progress Notes (Signed)
[facility]  PROGRESS NOTE  [name] VEH:[id number] DOB: 1950/04/17 DOA: 09/15/2018 PCP: [name], MD   Brief HPI:   68 year old female came to ED with fatigue, lethargy, fever.  She has a history of diabetes mellitus type 2, anemia, peripheral neuropathy, GERD, hypertension, hypothyroidism, paraplegia, sacral decubitus, hyperlipidemia, kidney stone.  She has suprapubic catheter due to neurogenic bladder.  In the ED patient was lethargic but arousable.  UA was found positive for UTI.  CT abdomen pelvis demonstrated likely fecal impaction and scattered stool and gas throughout the chronic confines.  Also demonstrate likely osteomyelitis involving left ischium related to chronic sacral decubitus ulcer.    Subjective   Patient seen and examined, currently on BiPAP.  Denies chest pain.  Appreciate PCCM assistance.   Assessment/Plan:     1. Sepsis due to UTI-patient has Klebsiella bacteremia also Klebsiella and E. coli growing in the urine culture.  Both E. coli and Klebsiella pneumoniae are sensitive to ceftriaxone.  Continue ceftriaxone.  Vancomycin has been discontinued.  Infectious disease following.  2. Klebsiella pneumonia bacteremia-source likely urine.  ID does not feel that patient has ischial osteomyelitis. Consider MRI or bone biopsy if the wound does not heal at the ischium.  Continue ceftriaxone as above.  3. Acute hypoxic/hypercapnic respiratory failure-patient will opt hypoxic respiratory failure initial ABG showed  pH 7.014, PCO2 93.2, PO2 74.1, reflecting acute respiratory acidosis.  Improved with BiPAP, repeat ABG showed pH 7.355, PCO2 47.1.  Patient diuresed well with IV Lasix.  Her breathing got worse, was requiring 2 L of oxygen high flow nasal cannula, PCCM was consulted for assistance.  PCCM recommended to continue with BiPAP and IV diuresis.  Patient is slowly improving.   4. Acute on chronic diastolic CHF-patient likely has worsening of chronic diastolic CHF  with hypertensive urgency.  Continue with Lasix 40 mg IV every 12 hours.  5. Hypertension-improved after starting on Lasix.  Continue metoprolol 100 g p.o. twice daily, nifedipine 30 mg p.o. daily  6. Diabetes mellitus type 2-blood glucose is elevated in 200s.  Continue moderate sliding scale insulin, dose of Lantus was increased to 20 units subcu daily.   7. Sacral decubitus ulcer-wound care consulted, Continue wound care per nursing.  8. Hypomagnesemia-magnesium was 1.4 yesterday, replaced. Repeat magnesium today is 1.9.     CBG: Recent Labs  Lab 09/17/18 0846 09/17/18 1246 09/17/18 1622 09/17/18 2137 09/18/18 0845  GLUCAP 242* 230* 218* 147* 188*    CBC: Recent Labs  Lab 09/15/18 1153 09/15/18 1403 09/15/18 1702 09/15/18 1739 09/17/18 0326  WBC 16.8*  --  11.4*  --  10.5  NEUTROABS 15.6*  --   --   --   --   HGB 13.9 13.6 12.5 12.2 14.0  HCT 44.1 40.0 39.6 36.0 43.1  MCV 96.7  --  98.0  --  94.7  PLT 192  --  156  --  836    Basic Metabolic Panel: Recent Labs  Lab 09/15/18 1153 09/15/18 1403 09/15/18 1702 09/15/18 1739 09/17/18 0326  NA 137 138  --  140 135  K 4.8 4.8  --  4.6 4.6  CL 100  --   --   --  101  CO2 23  --   --   --  24  GLUCOSE 270*  --   --   --  322*  BUN 30*  --   --   --  16  CREATININE 1.44*  --  1.23*  --  1.13*  CALCIUM 9.1  --   --   --  8.6*  MG  --   --  1.4*  --  1.9  PHOS  --   --  2.6  --   --      DVT prophylaxis: Heparin  Code Status: Full code  Family Communication: Discussed with patient's husband and daughter on phone.  Disposition Plan: likely home next week after clinically improved.     Consultants:    Procedures:     Antibiotics:   Anti-infectives (From admission, onward)   Start     Dose/Rate Route Frequency Ordered Stop   09/17/18 1500  vancomycin (VANCOCIN) 1,250 mg in sodium chloride 0.9 % 250 mL IVPB  Status:  Discontinued     1,250 mg 166.7 mL/hr over 90 Minutes Intravenous Every 48  hours 09/15/18 1412 09/16/18 1321   09/16/18 2000  cefTRIAXone (ROCEPHIN) 2 g in sodium chloride 0.9 % 100 mL IVPB     2 g 200 mL/hr over 30 Minutes Intravenous Every 24 hours 09/16/18 1105     09/16/18 0200  ceFEPIme (MAXIPIME) 2 g in sodium chloride 0.9 % 100 mL IVPB  Status:  Discontinued     2 g 200 mL/hr over 30 Minutes Intravenous Every 12 hours 09/15/18 1412 09/16/18 1105   09/15/18 1700  ceFEPIme (MAXIPIME) 2 g in sodium chloride 0.9 % 100 mL IVPB  Status:  Discontinued     2 g 200 mL/hr over 30 Minutes Intravenous  Once 09/15/18 1649 09/15/18 1700   09/15/18 1400  vancomycin (VANCOCIN) 1,500 mg in sodium chloride 0.9 % 500 mL IVPB     1,500 mg 250 mL/hr over 120 Minutes Intravenous  Once 09/15/18 1346 09/15/18 1649   09/15/18 1345  ceFEPIme (MAXIPIME) 2 g in sodium chloride 0.9 % 100 mL IVPB     2 g 200 mL/hr over 30 Minutes Intravenous  Once 09/15/18 1343 09/15/18 1458   09/15/18 1345  metroNIDAZOLE (FLAGYL) IVPB 500 mg     500 mg 100 mL/hr over 60 Minutes Intravenous  Once 09/15/18 1343 09/15/18 1600   09/15/18 1345  vancomycin (VANCOCIN) IVPB 1000 mg/200 mL premix  Status:  Discontinued     1,000 mg 200 mL/hr over 60 Minutes Intravenous  Once 09/15/18 1343 09/15/18 1346       Objective   Vitals:   09/18/18 0517 09/18/18 0848 09/18/18 0900 09/18/18 1131  BP: (!) 163/93  (!) 166/84 (!) 159/97  Pulse: (!) 101  (!) 107 (!) 105  Resp: (!) 26  (!) 24 (!) 26  Temp: (!) 97.4 F (36.3 C) 97.8 F (36.6 C)    TempSrc: Axillary Oral    SpO2: 95%  97% 95%  Weight:      Height:        Intake/Output Summary (Last 24 hours) at 09/18/2018 1132 Last data filed at 09/18/2018 1610 Gross per 24 hour  Intake 320 ml  Output 1475 ml  Net -1155 ml   Filed Weights   09/15/18 1538 09/15/18 2237  Weight: 90.7 kg 100.1 kg     Physical Examination:   General-appears in no acute distress Heart-S1-S2, regular, no murmur auscultated Lungs-bilateral rhonchi  auscultated Abdomen-soft, nontender, no organomegaly Extremities-no edema in the lower extremities Neuro-alert, oriented x3, no focal deficit noted   Data Reviewed: I have personally reviewed following labs and imaging studies   Recent Results (from the past 240 hour(s))  Urine culture     Status: Abnormal  Collection Time: 09/15/18 11:53 AM   Specimen: Urine, Random  Result Value Ref Range Status   Specimen Description URINE, RANDOM  Final   Special Requests   Final    NONE Performed at [facility], [place] [address], [place], [place] [address]    Culture (A)  Final    >=100,000 COLONIES/mL KLEBSIELLA PNEUMONIAE >=100,000 COLONIES/mL ESCHERICHIA COLI    Report Status 09/18/2018 FINAL  Final   Organism ID, Bacteria KLEBSIELLA PNEUMONIAE (A)  Final   Organism ID, Bacteria ESCHERICHIA COLI (A)  Final      Susceptibility   Escherichia coli - MIC*    AMPICILLIN <=2 SENSITIVE Sensitive     CEFAZOLIN <=4 SENSITIVE Sensitive     CEFTRIAXONE <=1 SENSITIVE Sensitive     CIPROFLOXACIN <=0.25 SENSITIVE Sensitive     GENTAMICIN <=1 SENSITIVE Sensitive     IMIPENEM <=0.25 SENSITIVE Sensitive     NITROFURANTOIN <=16 SENSITIVE Sensitive     TRIMETH/SULFA <=20 SENSITIVE Sensitive     AMPICILLIN/SULBACTAM <=2 SENSITIVE Sensitive     PIP/TAZO <=4 SENSITIVE Sensitive     Extended ESBL NEGATIVE Sensitive     * >=100,000 COLONIES/mL ESCHERICHIA COLI   Klebsiella pneumoniae - MIC*    AMPICILLIN >=32 RESISTANT Resistant     CEFAZOLIN <=4 SENSITIVE Sensitive     CEFTRIAXONE <=1 SENSITIVE Sensitive     CIPROFLOXACIN <=0.25 SENSITIVE Sensitive     GENTAMICIN <=1 SENSITIVE Sensitive     IMIPENEM <=0.25 SENSITIVE Sensitive     NITROFURANTOIN 128 RESISTANT Resistant     TRIMETH/SULFA <=20 SENSITIVE Sensitive     AMPICILLIN/SULBACTAM >=32 RESISTANT Resistant     PIP/TAZO <=4 SENSITIVE Sensitive     Extended ESBL NEGATIVE Sensitive     * >=100,000 COLONIES/mL KLEBSIELLA PNEUMONIAE  SARS  Coronavirus 2 (CEPHEID- Performed in [place] hospital lab), Hosp Order     Status: None   Collection Time: 09/15/18 11:54 AM   Specimen: Nasopharyngeal Swab  Result Value Ref Range Status   SARS Coronavirus 2 NEGATIVE NEGATIVE Final    Comment: (NOTE) If result is NEGATIVE SARS-CoV-2 target nucleic acids are NOT DETECTED. The SARS-CoV-2 RNA is generally detectable in upper and lower  respiratory specimens during the acute phase of infection. The lowest  concentration of SARS-CoV-2 viral copies this assay can detect is 250  copies / mL. A negative result does not preclude SARS-CoV-2 infection  and should not be used as the sole basis for treatment or other  patient management decisions.  A negative result may occur with  improper specimen collection / handling, submission of specimen other  than nasopharyngeal swab, presence of viral mutation(s) within the  areas targeted by this assay, and inadequate number of viral copies  (<250 copies / mL). A negative result must be combined with clinical  observations, patient history, and epidemiological information. If result is POSITIVE SARS-CoV-2 target nucleic acids are DETECTED. The SARS-CoV-2 RNA is generally detectable in upper and lower  respiratory specimens dur ing the acute phase of infection.  Positive  results are indicative of active infection with SARS-CoV-2.  Clinical  correlation with patient history and other diagnostic information is  necessary to determine patient infection status.  Positive results do  not rule out bacterial infection or co-infection with other viruses. If result is PRESUMPTIVE POSTIVE SARS-CoV-2 nucleic acids MAY BE PRESENT.   A presumptive positive result was obtained on the submitted specimen  and confirmed on repeat testing.  While 2019 novel coronavirus  (SARS-CoV-2)  nucleic acids may be present in the submitted sample  additional confirmatory testing may be necessary for epidemiological  and / or  clinical management purposes  to differentiate between  SARS-CoV-2 and other Sarbecovirus currently known to infect humans.  If clinically indicated additional testing with an alternate test  methodology [phone]) is advised. The SARS-CoV-2 RNA is generally  detectable in upper and lower respiratory sp ecimens during the acute  phase of infection. The expected result is Negative. Fact Sheet for Patients:  StrictlyIdeas.no Fact Sheet for Healthcare Providers: BankingDealers.co.za This test is not yet approved or cleared by the [place] FDA and has been authorized for detection and/or diagnosis of SARS-CoV-2 by FDA under an Emergency Use Authorization (EUA).  This EUA will remain in effect (meaning this test can be used) for the duration of the COVID-19 declaration under Section 564(b)(1) of the Act, 21 U.S.C. section 360bbb-3(b)(1), unless the authorization is terminated or revoked sooner. Performed at [facility], [place] [address] [place], [place] [address]   Blood Culture (routine x 2)     Status: Abnormal   Collection Time: 09/15/18 11:58 AM   Specimen: BLOOD  Result Value Ref Range Status   Specimen Description BLOOD BLOOD RIGHT FOREARM  Final   Special Requests   Final    BOTTLES DRAWN AEROBIC AND ANAEROBIC Blood Culture adequate volume   Culture  Setup Time   Final    GRAM NEGATIVE RODS AEROBIC BOTTLE ONLY CRITICAL VALUE NOTED.  VALUE IS CONSISTENT WITH PREVIOUSLY REPORTED AND CALLED VALUE.    Culture (A)  Final    KLEBSIELLA PNEUMONIAE SUSCEPTIBILITIES PERFORMED ON PREVIOUS CULTURE WITHIN THE LAST 5 DAYS. Performed at [facility], [place] [address], [place], [place] [address]    Report Status 09/18/2018 FINAL  Final  Blood Culture (routine x 2)     Status: Abnormal   Collection Time: 09/15/18 12:36 PM   Specimen: BLOOD  Result Value Ref Range Status   Specimen Description BLOOD SITE NOT SPECIFIED  Final    Special Requests   Final    BOTTLES DRAWN AEROBIC AND ANAEROBIC Blood Culture results may not be optimal due to an inadequate volume of blood received in culture bottles   Culture  Setup Time   Final    IN BOTH AEROBIC AND ANAEROBIC BOTTLES Organism ID to follow GRAM NEGATIVE RODS CRITICAL RESULT CALLED TO, READ BACK BY AND VERIFIED WITH: MCCARTHY PHARMD AT 0800 ON [id number] BY SJW Performed at [facility], [place] [address], [place], [place] [address]    Culture KLEBSIELLA PNEUMONIAE (A)  Final   Report Status 09/18/2018 FINAL  Final   Organism ID, Bacteria KLEBSIELLA PNEUMONIAE  Final      Susceptibility   Klebsiella pneumoniae - MIC*    AMPICILLIN >=32 RESISTANT Resistant     CEFAZOLIN <=4 SENSITIVE Sensitive     CEFEPIME <=1 SENSITIVE Sensitive     CEFTAZIDIME <=1 SENSITIVE Sensitive     CEFTRIAXONE <=1 SENSITIVE Sensitive     CIPROFLOXACIN <=0.25 SENSITIVE Sensitive     GENTAMICIN <=1 SENSITIVE Sensitive     IMIPENEM 0.5 SENSITIVE Sensitive     TRIMETH/SULFA <=20 SENSITIVE Sensitive     AMPICILLIN/SULBACTAM >=32 RESISTANT Resistant     PIP/TAZO 8 SENSITIVE Sensitive     Extended ESBL NEGATIVE Sensitive     * KLEBSIELLA PNEUMONIAE  Blood Culture ID Panel (Reflexed)     Status: Abnormal   Collection Time: 09/15/18 12:36 PM  Result Value  Ref Range Status   Enterococcus species NOT DETECTED NOT DETECTED Final   Listeria monocytogenes NOT DETECTED NOT DETECTED Final   Staphylococcus species NOT DETECTED NOT DETECTED Final   Staphylococcus aureus (BCID) NOT DETECTED NOT DETECTED Final   Streptococcus species NOT DETECTED NOT DETECTED Final   Streptococcus agalactiae NOT DETECTED NOT DETECTED Final   Streptococcus pneumoniae NOT DETECTED NOT DETECTED Final   Streptococcus pyogenes NOT DETECTED NOT DETECTED Final   Acinetobacter baumannii NOT DETECTED NOT DETECTED Final   Enterobacteriaceae species DETECTED (A) NOT DETECTED Final    Comment: Enterobacteriaceae represent a large  family of gram-negative bacteria, not a single organism. CRITICAL RESULT CALLED TO, READ BACK BY AND VERIFIED WITH: MCCARTHY PHARMD AT 0800 ON [id number] BY SJW    Enterobacter cloacae complex NOT DETECTED NOT DETECTED Final   Escherichia coli NOT DETECTED NOT DETECTED Final   Klebsiella oxytoca NOT DETECTED NOT DETECTED Final   Klebsiella pneumoniae DETECTED (A) NOT DETECTED Final    Comment: CRITICAL RESULT CALLED TO, READ BACK BY AND VERIFIED WITH: MCCARTHY PHARMD AT 0800 ON [id number] BY SJW    Proteus species NOT DETECTED NOT DETECTED Final   Serratia marcescens NOT DETECTED NOT DETECTED Final   Carbapenem resistance NOT DETECTED NOT DETECTED Final   Haemophilus influenzae NOT DETECTED NOT DETECTED Final   Neisseria meningitidis NOT DETECTED NOT DETECTED Final   Pseudomonas aeruginosa NOT DETECTED NOT DETECTED Final   Candida albicans NOT DETECTED NOT DETECTED Final   Candida glabrata NOT DETECTED NOT DETECTED Final   Candida krusei NOT DETECTED NOT DETECTED Final   Candida parapsilosis NOT DETECTED NOT DETECTED Final   Candida tropicalis NOT DETECTED NOT DETECTED Final    Comment: Performed at [facility], [address], [place], [place] [address]  MRSA PCR Screening     Status: Abnormal   Collection Time: 09/16/18  3:37 AM   Specimen: Nasopharyngeal  Result Value Ref Range Status   MRSA by PCR POSITIVE (A) NEGATIVE Final    Comment:        The GeneXpert MRSA Assay (FDA approved for NASAL specimens only), is one component of a comprehensive MRSA colonization surveillance program. It is not intended to diagnose MRSA infection nor to guide or monitor treatment for MRSA infections. RESULT CALLED TO, READ BACK BY AND VERIFIED WITH: LOFTIN,Q RN 09/16/2018 AT 6237 SKEEN,P Performed at [facility], [place] [address] [place], [place] [address]      Liver Function Tests: Recent Labs  Lab 09/15/18 1153  AST 51*  ALT 44  ALKPHOS 59  BILITOT 0.9  PROT 7.3  ALBUMIN  3.3*   No results for input(s): LIPASE, AMYLASE in the last 168 hours. No results for input(s): AMMONIA in the last 168 hours.  Cardiac Enzymes: Recent Labs  Lab 09/15/18 1153  TROPONINI <0.03   BNP (last 3 results) Recent Labs    09/15/18 1153 09/17/18 1445  BNP 76.7 178.8*    ProBNP (last 3 results) No results for input(s): PROBNP in the last 8760 hours.    Studies: Dg Chest Port 1 View  Result Date: 09/17/2018 CLINICAL DATA:  New onset shortness of breath EXAM: PORTABLE CHEST 1 VIEW COMPARISON:  09/15/2018 FINDINGS: There are diffuse bilateral hazy airspace opacities. The heart size is enlarged. There is no pneumothorax. No large pleural effusion. There is some elevation of the right hemidiaphragm. There is no acute osseous abnormality. There are degenerative changes of both shoulders. IMPRESSION: 1. Worsening hazy bilateral airspace  opacities which can be seen in patients with pulmonary edema versus an atypical infectious process such as viral pneumonia. 2. Borderline enlarged heart. Electronically Signed   By: Constance Holster M.D.   On: 09/17/2018 00:32   Dg Abd Portable 1v  Result Date: 09/17/2018 CLINICAL DATA:  Ileus EXAM: PORTABLE ABDOMEN - 1 VIEW COMPARISON:  CT 09/15/2018 FINDINGS: Gas within nondistended large and small bowel. Moderate stool throughout the colon. Calcifications project over the lower pole of the right kidney as seen on prior CT, stable. No evidence of bowel obstruction. IMPRESSION: Right lower pole nephrolithiasis. No evidence of bowel obstruction. Moderate stool burden throughout the colon. Electronically Signed   By: Rolm Baptise M.D.   On: 09/17/2018 19:19    Scheduled Meds: . Chlorhexidine Gluconate Cloth  6 each Topical Q0600  . DULoxetine  120 mg Oral Daily  . feeding supplement (PRO-STAT SUGAR FREE 64)  30 mL Oral TID BM  . ferrous sulfate  325 mg Oral QPC supper  . folic acid  1 mg Oral Daily  . furosemide  40 mg Intravenous Q12H  .  heparin  5,000 Units Subcutaneous Q8H  . insulin aspart  0-15 Units Subcutaneous TID WC  . insulin glargine  15 Units Subcutaneous QHS  . levothyroxine  125 mcg Oral QAC breakfast  . metoprolol tartrate  100 mg Oral BID  . multivitamin with minerals  1 tablet Oral Daily  . mupirocin ointment  1 application Nasal BID  . NIFEdipine  30 mg Oral QPC supper  . polyethylene glycol  17 g Oral BID  . pregabalin  75 mg Oral BID  . Ensure Max Protein  11 oz Oral Daily  . senna  2 tablet Oral QHS  . ascorbic acid  500 mg Oral Daily  . zinc sulfate  220 mg Oral Daily    Admission status: Inpatient: Based on patients clinical presentation and evaluation of above clinical data, I have made determination that patient meets Inpatient criteria at this time.   Time spent: 25 min  [facility] Pager [phone]. If 7PM-7AM, please contact night-coverage at [email or web address], Office  [phone]  password [facility]  09/18/2018, 11:32 AM  LOS: 3 days

## 2018-09-18 NOTE — Progress Notes (Signed)
Pt more alert on BiPAP, feels she could have a BM. I rolled her to her right side as she states this is how she usually has a BM at home. Will monitor closely. SpO2 stable on BiPAP during roll.

## 2018-09-18 NOTE — Progress Notes (Addendum)
Pt taken off bipap and placed on 7L salter [place].  Sats 89-93%.  Pt is in no distress.  RT will continue to monitor.   Pt had to go back on bipap due to increased WOB over time and pt requested it.

## 2018-09-19 LAB — COMPREHENSIVE METABOLIC PANEL
ALT: 53 U/L — ABNORMAL HIGH (ref 0–44)
AST: 42 U/L — ABNORMAL HIGH (ref 15–41)
Albumin: 3.4 g/dL — ABNORMAL LOW (ref 3.5–5.0)
Alkaline Phosphatase: 73 U/L (ref 38–126)
Anion gap: 15 (ref 5–15)
BUN: 12 mg/dL (ref 8–23)
CO2: 24 mmol/L (ref 22–32)
Calcium: 9.3 mg/dL (ref 8.9–10.3)
Chloride: 99 mmol/L (ref 98–111)
Creatinine, Ser: 0.82 mg/dL (ref 0.44–1.00)
GFR calc Af Amer: >60 mL/min (ref >60)
GFR calc non Af Amer: >60 mL/min (ref >60)
Glucose, Bld: 234 mg/dL — ABNORMAL HIGH (ref 70–99)
Potassium: 3.6 mmol/L (ref 3.5–5.1)
Sodium: 138 mmol/L (ref 135–145)
Total Bilirubin: 1 mg/dL (ref 0.3–1.2)
Total Protein: 9.2 g/dL — ABNORMAL HIGH (ref 6.5–8.1)

## 2018-09-19 LAB — CBC
HCT: 46.9 % — ABNORMAL HIGH (ref 36.0–46.0)
Hemoglobin: 15.3 g/dL — ABNORMAL HIGH (ref 12.0–15.0)
MCH: 30.5 pg (ref 26.0–34.0)
MCHC: 32.6 g/dL (ref 30.0–36.0)
MCV: 93.4 fL (ref 80.0–100.0)
Platelets: 256 10*3/uL (ref 150–400)
RBC: 5.02 MIL/uL (ref 3.87–5.11)
RDW: 13.2 % (ref 11.5–15.5)
WBC: 7.9 10*3/uL (ref 4.0–10.5)
nRBC: 0 % (ref 0.0–0.2)

## 2018-09-19 LAB — GLUCOSE, CAPILLARY
Glucose-Capillary: 185 mg/dL — ABNORMAL HIGH (ref 70–99)
Glucose-Capillary: 187 mg/dL — ABNORMAL HIGH (ref 70–99)
Glucose-Capillary: 206 mg/dL — ABNORMAL HIGH (ref 70–99)
Glucose-Capillary: 230 mg/dL — ABNORMAL HIGH (ref 70–99)

## 2018-09-19 MED ORDER — LORAZEPAM 2 MG/ML IJ SOLN
0.5000 mg | Freq: Three times a day (TID) | INTRAMUSCULAR | Status: DC | PRN
  Administered 2018-09-19 – 2018-09-23 (×4): 0.5 mg via INTRAVENOUS
  Filled 2018-09-19 (×5): qty 1

## 2018-09-19 MED ORDER — ORAL CARE MOUTH RINSE
15.0000 mL | Freq: Two times a day (BID) | OROMUCOSAL | Status: DC
  Administered 2018-09-20 – 2018-09-22 (×3): 15 mL via OROMUCOSAL

## 2018-09-19 MED ORDER — FUROSEMIDE 10 MG/ML IJ SOLN
60.0000 mg | Freq: Two times a day (BID) | INTRAMUSCULAR | Status: DC
  Administered 2018-09-19 – 2018-09-23 (×9): 60 mg via INTRAVENOUS
  Filled 2018-09-19 (×9): qty 6

## 2018-09-19 MED ORDER — CHLORHEXIDINE GLUCONATE 0.12 % MT SOLN
15.0000 mL | Freq: Two times a day (BID) | OROMUCOSAL | Status: DC
  Administered 2018-09-19 – 2018-09-23 (×7): 15 mL via OROMUCOSAL
  Filled 2018-09-19 (×8): qty 15

## 2018-09-19 NOTE — Progress Notes (Signed)
Spoke with patient's husband, Ki Luckman, gave update on pt's status. He shared that patient is usually able to have bowel movements with oral stool softeners. He was appreciative of the update and asked [place] to keep him informed.

## 2018-09-19 NOTE — Progress Notes (Signed)
Patient has had 4 episodes this shift of anxiety and taking Bipap off, stating that she cannot breathe, needs it off, or needs her face wiped. Patient reminded several times that she needs the Bipap on. O2 15L HFNC has been applied instead on Bipap for patient to take meds, sips of soda, or to take a break from the Bipap while this RN was in room. Patient could only tolerate this a few minutes until SpO2 would be less than 90%. Will continue to monitor.

## 2018-09-19 NOTE — Progress Notes (Signed)
Pt given soap suds enema with immediate return of hard stool. Prepped and educated pt, instilled soap suds. Pt unable to retain any but immediately produced several hard lumps of brown stool - total size a little larger than a 12oz soda can. Pt tolerated well. Pt felt some relief: abdomen noted to be much less firm.

## 2018-09-19 NOTE — Progress Notes (Signed)
Wound dressing changed: scant amount of serous drainage. Difficult to see if there is any open area to base of wound: Aquacell placed, gauze over it, and foam to hold in place.  Dressing to left lateral ankle changed as well. Area is dry with no drainage.

## 2018-09-19 NOTE — Progress Notes (Signed)
Patient placed back on bipap due to patient having a drop in sats to 79% and increased work of breathing.  Placed patient back on bipap and increased FIO2 from 60% to 70% to help improve patient's sats.  Patient is currently tolerating well.  Will continue to monitor.

## 2018-09-19 NOTE — Progress Notes (Signed)
Patient taken off of bipap and placed on 14L salter high flow nasal cannula.  Currently tolerating well.  Will continue to monitor.

## 2018-09-19 NOTE — Progress Notes (Signed)
[facility]  PROGRESS NOTE  [name] RXV:[id number] DOB: Apr 05, 1951 DOA: 09/15/2018 PCP: [name], MD   Brief HPI:   68 year old female came to ED with fatigue, lethargy, fever.  She has a history of diabetes mellitus type 2, anemia, peripheral neuropathy, GERD, hypertension, hypothyroidism, paraplegia, sacral decubitus, hyperlipidemia, kidney stone.  She has suprapubic catheter due to neurogenic bladder.  In the ED patient was lethargic but arousable.  UA was found positive for UTI.  CT abdomen pelvis demonstrated likely fecal impaction and scattered stool and gas throughout the chronic confines.  Also demonstrate likely osteomyelitis involving left ischium related to chronic sacral decubitus ulcer.    Subjective   Patient seen and examined, currently on BiPAP O2 sats hovering between 88 to 91% on 14 L oxygen high flow nasal cannula.  Breathing has improved.   Assessment/Plan:     1. Sepsis due to UTI-patient has Klebsiella bacteremia also Klebsiella and E. coli growing in the urine culture.  Both E. coli and Klebsiella pneumoniae are sensitive to ceftriaxone.  Continue ceftriaxone.  Vancomycin has been discontinued.  Infectious disease following.  2. Klebsiella pneumonia bacteremia-source likely urine.  ID does not feel that patient has ischial osteomyelitis. Consider MRI or bone biopsy if the wound does not heal at the ischium.  Continue ceftriaxone as above.  3. Acute hypoxic/hypercapnic respiratory failure-patient will opt hypoxic respiratory failure initial ABG showed  pH 7.014, PCO2 93.2, PO2 74.1, reflecting acute respiratory acidosis.  Improved with BiPAP, repeat ABG showed pH 7.355, PCO2 47.1.  Patient diuresed well with IV Lasix.  Her breathing got worse, was requiring 12 L of oxygen high flow nasal cannula, PCCM was consulted for assistance.  PCCM recommended to continue with BiPAP and IV diuresis.  Patient is slowly improving.   4. Acute on chronic diastolic  CHF-patient likely has worsening of chronic diastolic CHF with hypertensive urgency.  Will change Lasix to 60 mg IV every 12 hours.  5. Hypertension-improved after starting on Lasix.  Continue metoprolol 100 g p.o. twice daily, nifedipine 30 mg p.o. daily  6. Diabetes mellitus type 2-blood glucose is elevated in 200s.  Continue moderate sliding scale insulin, dose of Lantus was increased to 20 units subcu daily.   7. Sacral decubitus ulcer-wound care consulted, Continue wound care per nursing.  8. Hypomagnesemia-magnesium was 1.4 yesterday, replaced. Repeat magnesium  is 1.9.     CBG: Recent Labs  Lab 09/18/18 0845 09/18/18 1200 09/18/18 1724 09/19/18 0116 09/19/18 0749  GLUCAP 188* 194* 198* 206* 230*    CBC: Recent Labs  Lab 09/15/18 1153  09/15/18 1702 09/15/18 1739 09/17/18 0326 09/18/18 1903 09/19/18 0421  WBC 16.8*  --  11.4*  --  10.5 10.1 7.9  NEUTROABS 15.6*  --   --   --   --   --   --   HGB 13.9   < > 12.5 12.2 14.0 14.1 15.3*  HCT 44.1   < > 39.6 36.0 43.1 42.8 46.9*  MCV 96.7  --  98.0  --  94.7 92.8 93.4  PLT 192  --  156  --  191 216 256   < > = values in this interval not displayed.    Basic Metabolic Panel: Recent Labs  Lab 09/15/18 1153 09/15/18 1403 09/15/18 1702 09/15/18 1739 09/17/18 0326 09/18/18 1351 09/19/18 0421  NA 137 138  --  140 135 138 138  K 4.8 4.8  --  4.6 4.6 4.6 3.6  CL 100  --   --   --  101 104 99  CO2 23  --   --   --  24 19* 24  GLUCOSE 270*  --   --   --  322* 207* 234*  BUN 30*  --   --   --  16 14 12   CREATININE 1.44*  --  1.23*  --  1.13* 0.87 0.82  CALCIUM 9.1  --   --   --  8.6* 8.5* 9.3  MG  --   --  1.4*  --  1.9  --   --   PHOS  --   --  2.6  --   --   --   --      DVT prophylaxis: Heparin  Code Status: Full code  Family Communication: Discussed with patient's husband and daughter on phone.  Disposition Plan: likely home next week after clinically  improved.   Consultants:  PCCM    Antibiotics:   Anti-infectives (From admission, onward)   Start     Dose/Rate Route Frequency Ordered Stop   09/17/18 1500  vancomycin (VANCOCIN) 1,250 mg in sodium chloride 0.9 % 250 mL IVPB  Status:  Discontinued     1,250 mg 166.7 mL/hr over 90 Minutes Intravenous Every 48 hours 09/15/18 1412 09/16/18 1321   09/16/18 2000  cefTRIAXone (ROCEPHIN) 2 g in sodium chloride 0.9 % 100 mL IVPB     2 g 200 mL/hr over 30 Minutes Intravenous Every 24 hours 09/16/18 1105 09/21/18 2359   09/16/18 0200  ceFEPIme (MAXIPIME) 2 g in sodium chloride 0.9 % 100 mL IVPB  Status:  Discontinued     2 g 200 mL/hr over 30 Minutes Intravenous Every 12 hours 09/15/18 1412 09/16/18 1105   09/15/18 1700  ceFEPIme (MAXIPIME) 2 g in sodium chloride 0.9 % 100 mL IVPB  Status:  Discontinued     2 g 200 mL/hr over 30 Minutes Intravenous  Once 09/15/18 1649 09/15/18 1700   09/15/18 1400  vancomycin (VANCOCIN) 1,500 mg in sodium chloride 0.9 % 500 mL IVPB     1,500 mg 250 mL/hr over 120 Minutes Intravenous  Once 09/15/18 1346 09/15/18 1649   09/15/18 1345  ceFEPIme (MAXIPIME) 2 g in sodium chloride 0.9 % 100 mL IVPB     2 g 200 mL/hr over 30 Minutes Intravenous  Once 09/15/18 1343 09/15/18 1458   09/15/18 1345  metroNIDAZOLE (FLAGYL) IVPB 500 mg     500 mg 100 mL/hr over 60 Minutes Intravenous  Once 09/15/18 1343 09/15/18 1600   09/15/18 1345  vancomycin (VANCOCIN) IVPB 1000 mg/200 mL premix  Status:  Discontinued     1,000 mg 200 mL/hr over 60 Minutes Intravenous  Once 09/15/18 1343 09/15/18 1346       Objective   Vitals:   09/19/18 0127 09/19/18 0500 09/19/18 0527 09/19/18 0837  BP: (!) 152/80  (!) 154/91   Pulse: 100 (!) 105 (!) 106 (!) 114  Resp: (!) 27 (!) 29 (!) 29 (!) 30  Temp:      TempSrc:      SpO2: 96% 97% 97% 91%  Weight:      Height:        Intake/Output Summary (Last 24 hours) at 09/19/2018 1127 Last data filed at 09/19/2018 0751 Gross per 24 hour   Intake 580.18 ml  Output 2550 ml  Net -1969.82 ml   Filed Weights   09/15/18 1538 09/15/18 2237  Weight: 90.7 kg 100.1 kg     Physical Examination:  General-appears in  no acute distress Heart-S1-S2, regular, no murmur auscultated Lungs-bilateral rhonchi auscultated Abdomen-soft, nontender, no organomegaly Extremities-no edema in the lower extremities Neuro-alert, oriented x3, no focal deficit noted  Data Reviewed: I have personally reviewed following labs and imaging studies   Recent Results (from the past 240 hour(s))  Urine culture     Status: Abnormal   Collection Time: 09/15/18 11:53 AM   Specimen: Urine, Random  Result Value Ref Range Status   Specimen Description URINE, RANDOM  Final   Special Requests   Final    NONE Performed at [facility], [address], [place], [place] [address]    Culture (A)  Final    >=100,000 COLONIES/mL KLEBSIELLA PNEUMONIAE >=100,000 COLONIES/mL ESCHERICHIA COLI    Report Status 09/18/2018 FINAL  Final   Organism ID, Bacteria KLEBSIELLA PNEUMONIAE (A)  Final   Organism ID, Bacteria ESCHERICHIA COLI (A)  Final      Susceptibility   Escherichia coli - MIC*    AMPICILLIN <=2 SENSITIVE Sensitive     CEFAZOLIN <=4 SENSITIVE Sensitive     CEFTRIAXONE <=1 SENSITIVE Sensitive     CIPROFLOXACIN <=0.25 SENSITIVE Sensitive     GENTAMICIN <=1 SENSITIVE Sensitive     IMIPENEM <=0.25 SENSITIVE Sensitive     NITROFURANTOIN <=16 SENSITIVE Sensitive     TRIMETH/SULFA <=20 SENSITIVE Sensitive     AMPICILLIN/SULBACTAM <=2 SENSITIVE Sensitive     PIP/TAZO <=4 SENSITIVE Sensitive     Extended ESBL NEGATIVE Sensitive     * >=100,000 COLONIES/mL ESCHERICHIA COLI   Klebsiella pneumoniae - MIC*    AMPICILLIN >=32 RESISTANT Resistant     CEFAZOLIN <=4 SENSITIVE Sensitive     CEFTRIAXONE <=1 SENSITIVE Sensitive     CIPROFLOXACIN <=0.25 SENSITIVE Sensitive     GENTAMICIN <=1 SENSITIVE Sensitive     IMIPENEM <=0.25 SENSITIVE Sensitive      NITROFURANTOIN 128 RESISTANT Resistant     TRIMETH/SULFA <=20 SENSITIVE Sensitive     AMPICILLIN/SULBACTAM >=32 RESISTANT Resistant     PIP/TAZO <=4 SENSITIVE Sensitive     Extended ESBL NEGATIVE Sensitive     * >=100,000 COLONIES/mL KLEBSIELLA PNEUMONIAE  SARS Coronavirus 2 (CEPHEID- Performed in [facility] lab), Hosp Order     Status: None   Collection Time: 09/15/18 11:54 AM   Specimen: Nasopharyngeal Swab  Result Value Ref Range Status   SARS Coronavirus 2 NEGATIVE NEGATIVE Final    Comment: (NOTE) If result is NEGATIVE SARS-CoV-2 target nucleic acids are NOT DETECTED. The SARS-CoV-2 RNA is generally detectable in upper and lower  respiratory specimens during the acute phase of infection. The lowest  concentration of SARS-CoV-2 viral copies this assay can detect is 250  copies / mL. A negative result does not preclude SARS-CoV-2 infection  and should not be used as the sole basis for treatment or other  patient management decisions.  A negative result may occur with  improper specimen collection / handling, submission of specimen other  than nasopharyngeal swab, presence of viral mutation(s) within the  areas targeted by this assay, and inadequate number of viral copies  (<250 copies / mL). A negative result must be combined with clinical  observations, patient history, and epidemiological information. If result is POSITIVE SARS-CoV-2 target nucleic acids are DETECTED. The SARS-CoV-2 RNA is generally detectable in upper and lower  respiratory specimens dur ing the acute phase of infection.  Positive  results are indicative of active infection with SARS-CoV-2.  Clinical  correlation with patient history and other diagnostic  information is  necessary to determine patient infection status.  Positive results do  not rule out bacterial infection or co-infection with other viruses. If result is PRESUMPTIVE POSTIVE SARS-CoV-2 nucleic acids MAY BE PRESENT.   A presumptive  positive result was obtained on the submitted specimen  and confirmed on repeat testing.  While 2019 novel coronavirus  (SARS-CoV-2) nucleic acids may be present in the submitted sample  additional confirmatory testing may be necessary for epidemiological  and / or clinical management purposes  to differentiate between  SARS-CoV-2 and other Sarbecovirus currently known to infect humans.  If clinically indicated additional testing with an alternate test  methodology [phone]) is advised. The SARS-CoV-2 RNA is generally  detectable in upper and lower respiratory sp ecimens during the acute  phase of infection. The expected result is Negative. Fact Sheet for Patients:  StrictlyIdeas.no Fact Sheet for Healthcare Providers: BankingDealers.co.za This test is not yet approved or cleared by the [place] FDA and has been authorized for detection and/or diagnosis of SARS-CoV-2 by FDA under an Emergency Use Authorization (EUA).  This EUA will remain in effect (meaning this test can be used) for the duration of the COVID-19 declaration under Section 564(b)(1) of the Act, 21 U.S.C. section 360bbb-3(b)(1), unless the authorization is terminated or revoked sooner. Performed at [facility], [place] [address] [place], [place] [address]   Blood Culture (routine x 2)     Status: Abnormal   Collection Time: 09/15/18 11:58 AM   Specimen: BLOOD  Result Value Ref Range Status   Specimen Description BLOOD BLOOD RIGHT FOREARM  Final   Special Requests   Final    BOTTLES DRAWN AEROBIC AND ANAEROBIC Blood Culture adequate volume   Culture  Setup Time   Final    GRAM NEGATIVE RODS AEROBIC BOTTLE ONLY CRITICAL VALUE NOTED.  VALUE IS CONSISTENT WITH PREVIOUSLY REPORTED AND CALLED VALUE.    Culture (A)  Final    KLEBSIELLA PNEUMONIAE SUSCEPTIBILITIES PERFORMED ON PREVIOUS CULTURE WITHIN THE LAST 5 DAYS. Performed at [facility], [place] [address] [place], [place] [address]    Report Status 09/18/2018 FINAL  Final  Blood Culture (routine x 2)     Status: Abnormal   Collection Time: 09/15/18 12:36 PM   Specimen: BLOOD  Result Value Ref Range Status   Specimen Description BLOOD SITE NOT SPECIFIED  Final   Special Requests   Final    BOTTLES DRAWN AEROBIC AND ANAEROBIC Blood Culture results may not be optimal due to an inadequate volume of blood received in culture bottles   Culture  Setup Time   Final    IN BOTH AEROBIC AND ANAEROBIC BOTTLES Organism ID to follow GRAM NEGATIVE RODS CRITICAL RESULT CALLED TO, READ BACK BY AND VERIFIED WITH: MCCARTHY PHARMD AT 0800 ON [id number] BY SJW Performed at [facility], [place] [address], [place], [place] [address]    Culture KLEBSIELLA PNEUMONIAE (A)  Final   Report Status 09/18/2018 FINAL  Final   Organism ID, Bacteria KLEBSIELLA PNEUMONIAE  Final      Susceptibility   Klebsiella pneumoniae - MIC*    AMPICILLIN >=32 RESISTANT Resistant     CEFAZOLIN <=4 SENSITIVE Sensitive     CEFEPIME <=1 SENSITIVE Sensitive     CEFTAZIDIME <=1 SENSITIVE Sensitive     CEFTRIAXONE <=1 SENSITIVE Sensitive     CIPROFLOXACIN <=0.25 SENSITIVE Sensitive     GENTAMICIN <=1 SENSITIVE Sensitive     IMIPENEM 0.5 SENSITIVE Sensitive  TRIMETH/SULFA <=20 SENSITIVE Sensitive     AMPICILLIN/SULBACTAM >=32 RESISTANT Resistant     PIP/TAZO 8 SENSITIVE Sensitive     Extended ESBL NEGATIVE Sensitive     * KLEBSIELLA PNEUMONIAE  Blood Culture ID Panel (Reflexed)     Status: Abnormal   Collection Time: 09/15/18 12:36 PM  Result Value Ref Range Status   Enterococcus species NOT DETECTED NOT DETECTED Final   Listeria monocytogenes NOT DETECTED NOT DETECTED Final   Staphylococcus species NOT DETECTED NOT DETECTED Final   Staphylococcus aureus (BCID) NOT DETECTED NOT DETECTED Final   Streptococcus species NOT DETECTED NOT DETECTED Final   Streptococcus agalactiae NOT DETECTED NOT DETECTED Final   Streptococcus  pneumoniae NOT DETECTED NOT DETECTED Final   Streptococcus pyogenes NOT DETECTED NOT DETECTED Final   Acinetobacter baumannii NOT DETECTED NOT DETECTED Final   Enterobacteriaceae species DETECTED (A) NOT DETECTED Final    Comment: Enterobacteriaceae represent a large family of gram-negative bacteria, not a single organism. CRITICAL RESULT CALLED TO, READ BACK BY AND VERIFIED WITH: MCCARTHY PHARMD AT 0800 ON [id number] BY SJW    Enterobacter cloacae complex NOT DETECTED NOT DETECTED Final   Escherichia coli NOT DETECTED NOT DETECTED Final   Klebsiella oxytoca NOT DETECTED NOT DETECTED Final   Klebsiella pneumoniae DETECTED (A) NOT DETECTED Final    Comment: CRITICAL RESULT CALLED TO, READ BACK BY AND VERIFIED WITH: MCCARTHY PHARMD AT 0800 ON [id number] BY SJW    Proteus species NOT DETECTED NOT DETECTED Final   Serratia marcescens NOT DETECTED NOT DETECTED Final   Carbapenem resistance NOT DETECTED NOT DETECTED Final   Haemophilus influenzae NOT DETECTED NOT DETECTED Final   Neisseria meningitidis NOT DETECTED NOT DETECTED Final   Pseudomonas aeruginosa NOT DETECTED NOT DETECTED Final   Candida albicans NOT DETECTED NOT DETECTED Final   Candida glabrata NOT DETECTED NOT DETECTED Final   Candida krusei NOT DETECTED NOT DETECTED Final   Candida parapsilosis NOT DETECTED NOT DETECTED Final   Candida tropicalis NOT DETECTED NOT DETECTED Final    Comment: Performed at [facility], [address], [place], [place] [address]  MRSA PCR Screening     Status: Abnormal   Collection Time: 09/16/18  3:37 AM   Specimen: Nasopharyngeal  Result Value Ref Range Status   MRSA by PCR POSITIVE (A) NEGATIVE Final    Comment:        The GeneXpert MRSA Assay (FDA approved for NASAL specimens only), is one component of a comprehensive MRSA colonization surveillance program. It is not intended to diagnose MRSA infection nor to guide or monitor treatment for MRSA infections. RESULT CALLED TO, READ BACK  BY AND VERIFIED WITH: LOFTIN,Q RN 09/16/2018 AT 8182 SKEEN,P Performed at [facility], [place] [address] [place], [place] [address]      Liver Function Tests: Recent Labs  Lab 09/15/18 1153 09/19/18 0421  AST 51* 42*  ALT 44 53*  ALKPHOS 59 73  BILITOT 0.9 1.0  PROT 7.3 9.2*  ALBUMIN 3.3* 3.4*    Cardiac Enzymes: Recent Labs  Lab 09/15/18 1153  TROPONINI <0.03   BNP (last 3 results) Recent Labs    09/15/18 1153 09/17/18 1445  BNP 76.7 178.8*    ProBNP (last 3 results) No results for input(s): PROBNP in the last 8760 hours.    Studies: Dg Abd Portable 1v  Result Date: 09/17/2018 CLINICAL DATA:  Ileus EXAM: PORTABLE ABDOMEN - 1 VIEW COMPARISON:  CT 09/15/2018 FINDINGS: Gas within nondistended large and small  bowel. Moderate stool throughout the colon. Calcifications project over the lower pole of the right kidney as seen on prior CT, stable. No evidence of bowel obstruction. IMPRESSION: Right lower pole nephrolithiasis. No evidence of bowel obstruction. Moderate stool burden throughout the colon. Electronically Signed   By: Rolm Baptise M.D.   On: 09/17/2018 19:19    Scheduled Meds: . Chlorhexidine Gluconate Cloth  6 each Topical Q0600  . DULoxetine  120 mg Oral Daily  . feeding supplement (PRO-STAT SUGAR FREE 64)  30 mL Oral TID BM  . ferrous sulfate  325 mg Oral QPC supper  . folic acid  1 mg Oral Daily  . furosemide  60 mg Intravenous Q12H  . heparin  5,000 Units Subcutaneous Q8H  . insulin aspart  0-15 Units Subcutaneous TID WC  . insulin glargine  15 Units Subcutaneous QHS  . levothyroxine  125 mcg Oral QAC breakfast  . metoprolol tartrate  100 mg Oral BID  . multivitamin with minerals  1 tablet Oral Daily  . mupirocin ointment  1 application Nasal BID  . NIFEdipine  30 mg Oral QPC supper  . polyethylene glycol  17 g Oral BID  . pregabalin  75 mg Oral BID  . Ensure Max Protein  11 oz Oral Daily  . senna  2 tablet Oral QHS  . ascorbic acid  500 mg  Oral Daily  . zinc sulfate  220 mg Oral Daily    Admission status: Inpatient: Based on patients clinical presentation and evaluation of above clinical data, I have made determination that patient meets Inpatient criteria at this time.   Time spent: 25 min  [facility] Pager [phone]. If 7PM-7AM, please contact night-coverage at [email or web address], Office  [phone]  password [email or web address]  09/19/2018, 11:27 AM  LOS: 4 days

## 2018-09-19 NOTE — Significant Event (Signed)
Rapid Response Event Note  I was following up on 5W13, RN and RT updated me on the patient. Patient had to be placed on the BIPAP d/t increased WOB/SOB and hypoxia. Patient is having a hard time staying on the BIPAP, per the nurse and the notes, patient frequently takes the mask off and then becomes distressed. I explained to the patient, the importance of BIPAP compliance, she stated she understood but she stated she needs something for her "nerves". Currently RR is in the mid 30s and + SOB. RN to give Lasix and enema and then will update MD. I recommended that RN ask MD for something to calm the patient, her anxiety on the BIPAP drives her RR higher and then she becomes more short of breath on the BIPAP. 99% on BIPAP 70% 18/10.   RN to update MD.   - High Risk for needing Intubation of BIPAP compliance is not achieved.   Shifra Swartzentruber R

## 2018-09-19 NOTE — Progress Notes (Signed)
Pt continues off BiPAP. When talking, drinking liquids, pt desats to low 80s. When sitting up and calm, maintains 88% and above, mostly 93% and above. Will continue to monitor.

## 2018-09-20 ENCOUNTER — Inpatient Hospital Stay (HOSPITAL_COMMUNITY): Payer: PPO

## 2018-09-20 DIAGNOSIS — J939 Pneumothorax, unspecified: Secondary | ICD-10-CM

## 2018-09-20 LAB — MAGNESIUM: Magnesium: 2.2 mg/dL (ref 1.7–2.4)

## 2018-09-20 LAB — GLUCOSE, CAPILLARY
Glucose-Capillary: 200 mg/dL — ABNORMAL HIGH (ref 70–99)
Glucose-Capillary: 205 mg/dL — ABNORMAL HIGH (ref 70–99)
Glucose-Capillary: 149 mg/dL — ABNORMAL HIGH (ref 70–99)
Glucose-Capillary: 229 mg/dL — ABNORMAL HIGH (ref 70–99)

## 2018-09-20 LAB — BASIC METABOLIC PANEL
Anion gap: 13 (ref 5–15)
BUN: 15 mg/dL (ref 8–23)
CO2: 25 mmol/L (ref 22–32)
Calcium: 9.3 mg/dL (ref 8.9–10.3)
Chloride: 103 mmol/L (ref 98–111)
Creatinine, Ser: 0.77 mg/dL (ref 0.44–1.00)
GFR calc Af Amer: >60 mL/min (ref >60)
GFR calc non Af Amer: >60 mL/min (ref >60)
Glucose, Bld: 203 mg/dL — ABNORMAL HIGH (ref 70–99)
Potassium: 3.5 mmol/L (ref 3.5–5.1)
Sodium: 141 mmol/L (ref 135–145)

## 2018-09-20 MED ORDER — POTASSIUM CHLORIDE CRYS ER 20 MEQ PO TBCR
40.0000 meq | EXTENDED_RELEASE_TABLET | Freq: Once | ORAL | Status: AC
  Administered 2018-09-20: 40 meq via ORAL
  Filled 2018-09-20: qty 2

## 2018-09-20 MED ORDER — OXYCODONE-ACETAMINOPHEN 10-325 MG PO TABS: 1.0000 | ORAL_TABLET | Freq: Four times a day (QID) | ORAL | Status: DC | PRN

## 2018-09-20 MED ORDER — INSULIN GLARGINE 100 UNIT/ML ~~LOC~~ SOLN
18.0000 [IU] | Freq: Every day | SUBCUTANEOUS | Status: DC
  Administered 2018-09-20 – 2018-09-21 (×2): 18 [IU] via SUBCUTANEOUS
  Filled 2018-09-20 (×3): qty 0.18

## 2018-09-20 MED ORDER — OXYCODONE HCL 5 MG PO TABS
5.0000 mg | ORAL_TABLET | Freq: Four times a day (QID) | ORAL | Status: DC | PRN
  Administered 2018-09-20 – 2018-09-23 (×9): 5 mg via ORAL
  Filled 2018-09-20 (×9): qty 1

## 2018-09-20 MED ORDER — OXYCODONE-ACETAMINOPHEN 5-325 MG PO TABS
1.0000 | ORAL_TABLET | Freq: Four times a day (QID) | ORAL | Status: DC | PRN
  Administered 2018-09-20 – 2018-09-23 (×9): 1 via ORAL
  Filled 2018-09-20 (×9): qty 1

## 2018-09-20 NOTE — Progress Notes (Signed)
Inpatient Diabetes Program Recommendations  AACE/ADA: New Consensus Statement on Inpatient Glycemic Control (2015)  Target Ranges:  Prepandial:   less than 140 mg/dL      Peak postprandial:   less than 180 mg/dL (1-2 hours)      Critically ill patients:  140 - 180 mg/dL   Results for PATRISHA, HAUSMANN (MRN [id number]) as of 09/20/2018 12:37  Ref. Range 09/19/2018 01:16 09/19/2018 07:49 09/19/2018 19:50 09/19/2018 23:32 09/20/2018 07:57  Glucose-Capillary Latest Ref Range: 70 - 99 mg/dL 206 (H) 230 (H) 187 (H) 185 (H) 200 (H)    Review of Glycemic Control  Diabetes history: DM 2 Outpatient Diabetes medications: Metformin 250 mg BID Current orders for Inpatient glycemic control: Novolog 0-15 tid, Lantus 15 units  A1c 10%  Glucose trends in the high 200 range after Lantus 10 units. Consider increasing Lantus to 18-20 units.  Thanks, Tama Headings RN, MSN, BC-ADM Inpatient Diabetes Coordinator Team Pager [phone] (8a-5p)

## 2018-09-20 NOTE — Progress Notes (Addendum)
Call from Dr. Posey Pronto verbal reading of portable chest xray  Subtle lucency at the right lung base which may be artifactual versus a basilar pneumothorax. Recommend repeat in 24 hours or a PA and lateral x-ray.  MD notified will order pa/lat xray

## 2018-09-20 NOTE — Plan of Care (Addendum)
  Problem: Respiratory: Goal: Ability to maintain adequate ventilation will improve Outcome: Progressing Note: Weaned from 10L High flow nasal cannula to 5-6L HFNC sat 95%.  Encouraged I/S use hourly reached 1000.  CT scan ordered to rule out pneumothorax.    Problem: Clinical Measurements: Goal: Ability to maintain clinical measurements within normal limits will improve Outcome: Progressing Goal: Diagnostic test results will improve Outcome: Progressing   Voided per urethra x1 adjusted suprapubic catheter to make sure there are no kinks

## 2018-09-20 NOTE — Progress Notes (Signed)
Patient back to unit. Patient A&O. No distress noted.

## 2018-09-20 NOTE — Progress Notes (Signed)
Per MD patient should be diuresed before considering PT/OT consult.

## 2018-09-20 NOTE — Progress Notes (Signed)
[facility]  PROGRESS NOTE  [name] PYK:[id number] DOB: January 26, 1951 DOA: 09/15/2018 PCP: [name], MD   Brief HPI:   68 year old female came to ED with fatigue, lethargy, fever.  She has a history of diabetes mellitus type 2, anemia, peripheral neuropathy, GERD, hypertension, hypothyroidism, paraplegia, sacral decubitus, hyperlipidemia, kidney stone.  She has suprapubic catheter due to neurogenic bladder.  In the ED patient was lethargic but arousable.  UA was found positive for UTI.  CT abdomen pelvis demonstrated likely fecal impaction and scattered stool and gas throughout the chronic confines.  Also demonstrate likely osteomyelitis involving left ischium related to chronic sacral decubitus ulcer.    Subjective   Patient seen and examined, she is off BiPAP.  Requiring 5 L oxygen via high flow nasal cannula.  Chest x-ray obtained this morning showed questionable basilar pneumothorax on right.   Assessment/Plan:     1. Sepsis due to UTI-patient has Klebsiella bacteremia also Klebsiella and E. coli growing in the urine culture.  Both E. coli and Klebsiella pneumoniae are sensitive to ceftriaxone.  Continue ceftriaxone.  Vancomycin has been discontinued.  Infectious disease following.  2. Klebsiella pneumonia bacteremia-source likely urine.  ID does not feel that patient has ischial osteomyelitis. Consider MRI or bone biopsy if the wound does not heal at the ischium.  Continue ceftriaxone as above.  3. Acute hypoxic/hypercapnic respiratory failure-slowly improving, patient developed  hypoxic respiratory failure initial ABG showed  pH 7.014, PCO2 93.2, PO2 74.1, reflecting acute respiratory acidosis.  Improved with BiPAP, repeat ABG showed pH 7.355, PCO2 47.1.  Patient diuresed well with IV Lasix.  Her breathing got worse, was 8 to 10 L of 12 L of oxygen high flow nasal cannula, PCCM was consulted for assistance.  PCCM recommended to continue with BiPAP and IV diuresis.  Patient is  slowly improving.   4. ?  Right basilar pneumothorax-seen on portable chest x-ray done this morning.  Recommended to view to confirm pneumothorax.  Chest x-ray PA and lateral was ordered which also was unable to confirm pneumothorax.  Discussed with Dr. Lamonte Sakai, pulmonary he recommends getting CT chest without contrast to confirm pneumothorax.  5. Acute on chronic diastolic CHF-patient likely has worsening of chronic diastolic CHF with hypertensive urgency.  Continue Lasix 60 mg IV every 12 hours.  Net -6.4 L.  6. Hypertension-improved after starting on Lasix.  Continue metoprolol 100 g p.o. twice daily, nifedipine 30 mg p.o. daily  7. Diabetes mellitus type 2-blood glucose is elevated in 200s.  Continue moderate sliding scale insulin, dose of Lantus increased to 18 units subcu daily.   8. Sacral decubitus ulcer-wound care consulted, Continue wound care per nursing.  9. Hypomagnesemia-magnesium was 1.4 yesterday, replaced. Repeat magnesium  is 1.9.     CBG: Recent Labs  Lab 09/19/18 0749 09/19/18 1950 09/19/18 2332 09/20/18 0757 09/20/18 1304  GLUCAP 230* 187* 185* 200* 205*    CBC: Recent Labs  Lab 09/15/18 1153  09/15/18 1702 09/15/18 1739 09/17/18 0326 09/18/18 1903 09/19/18 0421  WBC 16.8*  --  11.4*  --  10.5 10.1 7.9  NEUTROABS 15.6*  --   --   --   --   --   --   HGB 13.9   < > 12.5 12.2 14.0 14.1 15.3*  HCT 44.1   < > 39.6 36.0 43.1 42.8 46.9*  MCV 96.7  --  98.0  --  94.7 92.8 93.4  PLT 192  --  156  --  191 216 256   < > =  values in this interval not displayed.    Basic Metabolic Panel: Recent Labs  Lab 09/15/18 1153  09/15/18 1702 09/15/18 1739 09/17/18 0326 09/18/18 1351 09/19/18 0421 09/20/18 0251  NA 137   < >  --  140 135 138 138 141  K 4.8   < >  --  4.6 4.6 4.6 3.6 3.5  CL 100  --   --   --  101 104 99 103  CO2 23  --   --   --  24 19* 24 25  GLUCOSE 270*  --   --   --  322* 207* 234* 203*  BUN 30*  --   --   --  16 14 12 15   CREATININE  1.44*  --  1.23*  --  1.13* 0.87 0.82 0.77  CALCIUM 9.1  --   --   --  8.6* 8.5* 9.3 9.3  MG  --   --  1.4*  --  1.9  --   --  2.2  PHOS  --   --  2.6  --   --   --   --   --    < > = values in this interval not displayed.     DVT prophylaxis: Heparin  Code Status: Full code  Family Communication: Discussed with patient's husband and daughter on phone.  Disposition Plan: likely home next week after clinically improved.   Consultants:  PCCM    Antibiotics:   Anti-infectives (From admission, onward)   Start     Dose/Rate Route Frequency Ordered Stop   09/17/18 1500  vancomycin (VANCOCIN) 1,250 mg in sodium chloride 0.9 % 250 mL IVPB  Status:  Discontinued     1,250 mg 166.7 mL/hr over 90 Minutes Intravenous Every 48 hours 09/15/18 1412 09/16/18 1321   09/16/18 2000  cefTRIAXone (ROCEPHIN) 2 g in sodium chloride 0.9 % 100 mL IVPB     2 g 200 mL/hr over 30 Minutes Intravenous Every 24 hours 09/16/18 1105 09/21/18 2359   09/16/18 0200  ceFEPIme (MAXIPIME) 2 g in sodium chloride 0.9 % 100 mL IVPB  Status:  Discontinued     2 g 200 mL/hr over 30 Minutes Intravenous Every 12 hours 09/15/18 1412 09/16/18 1105   09/15/18 1700  ceFEPIme (MAXIPIME) 2 g in sodium chloride 0.9 % 100 mL IVPB  Status:  Discontinued     2 g 200 mL/hr over 30 Minutes Intravenous  Once 09/15/18 1649 09/15/18 1700   09/15/18 1400  vancomycin (VANCOCIN) 1,500 mg in sodium chloride 0.9 % 500 mL IVPB     1,500 mg 250 mL/hr over 120 Minutes Intravenous  Once 09/15/18 1346 09/15/18 1649   09/15/18 1345  ceFEPIme (MAXIPIME) 2 g in sodium chloride 0.9 % 100 mL IVPB     2 g 200 mL/hr over 30 Minutes Intravenous  Once 09/15/18 1343 09/15/18 1458   09/15/18 1345  metroNIDAZOLE (FLAGYL) IVPB 500 mg     500 mg 100 mL/hr over 60 Minutes Intravenous  Once 09/15/18 1343 09/15/18 1600   09/15/18 1345  vancomycin (VANCOCIN) IVPB 1000 mg/200 mL premix  Status:  Discontinued     1,000 mg 200 mL/hr over 60 Minutes  Intravenous  Once 09/15/18 1343 09/15/18 1346       Objective   Vitals:   09/20/18 0800 09/20/18 0824 09/20/18 1255 09/20/18 1308  BP: (!) 164/87 (!) 164/87 (!) 167/80   Pulse: (!) 124  (!) 101   Resp: (!) 28 Marland Kitchen)  22 (!) 23   Temp: 98.2 F (36.8 C)  98.7 F (37.1 C)   TempSrc: Oral  Oral   SpO2: 92% 92% 95% 96%  Weight:      Height:        Intake/Output Summary (Last 24 hours) at 09/20/2018 1330 Last data filed at 09/20/2018 0845 Gross per 24 hour  Intake 781.24 ml  Output 1800 ml  Net -1018.76 ml   Filed Weights   09/15/18 1538 09/15/18 2237  Weight: 90.7 kg 100.1 kg     Physical Examination:  General-appears in no acute distress Heart-S1-S2, regular, no murmur auscultated Lungs-scattered rhonchi bilaterally Abdomen-soft, nontender, no organomegaly Extremities-no edema in the lower extremities Neuro-alert, oriented x3, no focal deficit noted  Data Reviewed: I have personally reviewed following labs and imaging studies   Recent Results (from the past 240 hour(s))  Urine culture     Status: Abnormal   Collection Time: 09/15/18 11:53 AM   Specimen: Urine, Random  Result Value Ref Range Status   Specimen Description URINE, RANDOM  Final   Special Requests   Final    NONE Performed at [facility], [address], [place], [place] [address]    Culture (A)  Final    >=100,000 COLONIES/mL KLEBSIELLA PNEUMONIAE >=100,000 COLONIES/mL ESCHERICHIA COLI    Report Status 09/18/2018 FINAL  Final   Organism ID, Bacteria KLEBSIELLA PNEUMONIAE (A)  Final   Organism ID, Bacteria ESCHERICHIA COLI (A)  Final      Susceptibility   Escherichia coli - MIC*    AMPICILLIN <=2 SENSITIVE Sensitive     CEFAZOLIN <=4 SENSITIVE Sensitive     CEFTRIAXONE <=1 SENSITIVE Sensitive     CIPROFLOXACIN <=0.25 SENSITIVE Sensitive     GENTAMICIN <=1 SENSITIVE Sensitive     IMIPENEM <=0.25 SENSITIVE Sensitive     NITROFURANTOIN <=16 SENSITIVE Sensitive     TRIMETH/SULFA <=20  SENSITIVE Sensitive     AMPICILLIN/SULBACTAM <=2 SENSITIVE Sensitive     PIP/TAZO <=4 SENSITIVE Sensitive     Extended ESBL NEGATIVE Sensitive     * >=100,000 COLONIES/mL ESCHERICHIA COLI   Klebsiella pneumoniae - MIC*    AMPICILLIN >=32 RESISTANT Resistant     CEFAZOLIN <=4 SENSITIVE Sensitive     CEFTRIAXONE <=1 SENSITIVE Sensitive     CIPROFLOXACIN <=0.25 SENSITIVE Sensitive     GENTAMICIN <=1 SENSITIVE Sensitive     IMIPENEM <=0.25 SENSITIVE Sensitive     NITROFURANTOIN 128 RESISTANT Resistant     TRIMETH/SULFA <=20 SENSITIVE Sensitive     AMPICILLIN/SULBACTAM >=32 RESISTANT Resistant     PIP/TAZO <=4 SENSITIVE Sensitive     Extended ESBL NEGATIVE Sensitive     * >=100,000 COLONIES/mL KLEBSIELLA PNEUMONIAE  SARS Coronavirus 2 (CEPHEID- Performed in [place] hospital lab), Hosp Order     Status: None   Collection Time: 09/15/18 11:54 AM   Specimen: Nasopharyngeal Swab  Result Value Ref Range Status   SARS Coronavirus 2 NEGATIVE NEGATIVE Final    Comment: (NOTE) If result is NEGATIVE SARS-CoV-2 target nucleic acids are NOT DETECTED. The SARS-CoV-2 RNA is generally detectable in upper and lower  respiratory specimens during the acute phase of infection. The lowest  concentration of SARS-CoV-2 viral copies this assay can detect is 250  copies / mL. A negative result does not preclude SARS-CoV-2 infection  and should not be used as the sole basis for treatment or other  patient management decisions.  A negative result may occur with  improper specimen collection /  handling, submission of specimen other  than nasopharyngeal swab, presence of viral mutation(s) within the  areas targeted by this assay, and inadequate number of viral copies  (<250 copies / mL). A negative result must be combined with clinical  observations, patient history, and epidemiological information. If result is POSITIVE SARS-CoV-2 target nucleic acids are DETECTED. The SARS-CoV-2 RNA is generally  detectable in upper and lower  respiratory specimens dur ing the acute phase of infection.  Positive  results are indicative of active infection with SARS-CoV-2.  Clinical  correlation with patient history and other diagnostic information is  necessary to determine patient infection status.  Positive results do  not rule out bacterial infection or co-infection with other viruses. If result is PRESUMPTIVE POSTIVE SARS-CoV-2 nucleic acids MAY BE PRESENT.   A presumptive positive result was obtained on the submitted specimen  and confirmed on repeat testing.  While 2019 novel coronavirus  (SARS-CoV-2) nucleic acids may be present in the submitted sample  additional confirmatory testing may be necessary for epidemiological  and / or clinical management purposes  to differentiate between  SARS-CoV-2 and other Sarbecovirus currently known to infect humans.  If clinically indicated additional testing with an alternate test  methodology [phone]) is advised. The SARS-CoV-2 RNA is generally  detectable in upper and lower respiratory sp ecimens during the acute  phase of infection. The expected result is Negative. Fact Sheet for Patients:  StrictlyIdeas.no Fact Sheet for Healthcare Providers: BankingDealers.co.za This test is not yet approved or cleared by the [place] FDA and has been authorized for detection and/or diagnosis of SARS-CoV-2 by FDA under an Emergency Use Authorization (EUA).  This EUA will remain in effect (meaning this test can be used) for the duration of the COVID-19 declaration under Section 564(b)(1) of the Act, 21 U.S.C. section 360bbb-3(b)(1), unless the authorization is terminated or revoked sooner. Performed at [facility], [place] [address], [place], [place] [address]   Blood Culture (routine x 2)     Status: Abnormal   Collection Time: 09/15/18 11:58 AM   Specimen: BLOOD  Result Value Ref Range Status    Specimen Description BLOOD BLOOD RIGHT FOREARM  Final   Special Requests   Final    BOTTLES DRAWN AEROBIC AND ANAEROBIC Blood Culture adequate volume   Culture  Setup Time   Final    GRAM NEGATIVE RODS AEROBIC BOTTLE ONLY CRITICAL VALUE NOTED.  VALUE IS CONSISTENT WITH PREVIOUSLY REPORTED AND CALLED VALUE.    Culture (A)  Final    KLEBSIELLA PNEUMONIAE SUSCEPTIBILITIES PERFORMED ON PREVIOUS CULTURE WITHIN THE LAST 5 DAYS. Performed at [facility], [place] [address] [place], [place] [address]    Report Status 09/18/2018 FINAL  Final  Blood Culture (routine x 2)     Status: Abnormal   Collection Time: 09/15/18 12:36 PM   Specimen: BLOOD  Result Value Ref Range Status   Specimen Description BLOOD SITE NOT SPECIFIED  Final   Special Requests   Final    BOTTLES DRAWN AEROBIC AND ANAEROBIC Blood Culture results may not be optimal due to an inadequate volume of blood received in culture bottles   Culture  Setup Time   Final    IN BOTH AEROBIC AND ANAEROBIC BOTTLES Organism ID to follow GRAM NEGATIVE RODS CRITICAL RESULT CALLED TO, READ BACK BY AND VERIFIED WITH: MCCARTHY PHARMD AT 0800 ON [id number] BY SJW Performed at [facility], [place] [address], [place], [place] [address]    Culture  KLEBSIELLA PNEUMONIAE (A)  Final   Report Status 09/18/2018 FINAL  Final   Organism ID, Bacteria KLEBSIELLA PNEUMONIAE  Final      Susceptibility   Klebsiella pneumoniae - MIC*    AMPICILLIN >=32 RESISTANT Resistant     CEFAZOLIN <=4 SENSITIVE Sensitive     CEFEPIME <=1 SENSITIVE Sensitive     CEFTAZIDIME <=1 SENSITIVE Sensitive     CEFTRIAXONE <=1 SENSITIVE Sensitive     CIPROFLOXACIN <=0.25 SENSITIVE Sensitive     GENTAMICIN <=1 SENSITIVE Sensitive     IMIPENEM 0.5 SENSITIVE Sensitive     TRIMETH/SULFA <=20 SENSITIVE Sensitive     AMPICILLIN/SULBACTAM >=32 RESISTANT Resistant     PIP/TAZO 8 SENSITIVE Sensitive     Extended ESBL NEGATIVE Sensitive     * KLEBSIELLA PNEUMONIAE  Blood  Culture ID Panel (Reflexed)     Status: Abnormal   Collection Time: 09/15/18 12:36 PM  Result Value Ref Range Status   Enterococcus species NOT DETECTED NOT DETECTED Final   Listeria monocytogenes NOT DETECTED NOT DETECTED Final   Staphylococcus species NOT DETECTED NOT DETECTED Final   Staphylococcus aureus (BCID) NOT DETECTED NOT DETECTED Final   Streptococcus species NOT DETECTED NOT DETECTED Final   Streptococcus agalactiae NOT DETECTED NOT DETECTED Final   Streptococcus pneumoniae NOT DETECTED NOT DETECTED Final   Streptococcus pyogenes NOT DETECTED NOT DETECTED Final   Acinetobacter baumannii NOT DETECTED NOT DETECTED Final   Enterobacteriaceae species DETECTED (A) NOT DETECTED Final    Comment: Enterobacteriaceae represent a large family of gram-negative bacteria, not a single organism. CRITICAL RESULT CALLED TO, READ BACK BY AND VERIFIED WITH: MCCARTHY PHARMD AT 0800 ON [id number] BY SJW    Enterobacter cloacae complex NOT DETECTED NOT DETECTED Final   Escherichia coli NOT DETECTED NOT DETECTED Final   Klebsiella oxytoca NOT DETECTED NOT DETECTED Final   Klebsiella pneumoniae DETECTED (A) NOT DETECTED Final    Comment: CRITICAL RESULT CALLED TO, READ BACK BY AND VERIFIED WITH: MCCARTHY PHARMD AT 0800 ON [id number] BY SJW    Proteus species NOT DETECTED NOT DETECTED Final   Serratia marcescens NOT DETECTED NOT DETECTED Final   Carbapenem resistance NOT DETECTED NOT DETECTED Final   Haemophilus influenzae NOT DETECTED NOT DETECTED Final   Neisseria meningitidis NOT DETECTED NOT DETECTED Final   Pseudomonas aeruginosa NOT DETECTED NOT DETECTED Final   Candida albicans NOT DETECTED NOT DETECTED Final   Candida glabrata NOT DETECTED NOT DETECTED Final   Candida krusei NOT DETECTED NOT DETECTED Final   Candida parapsilosis NOT DETECTED NOT DETECTED Final   Candida tropicalis NOT DETECTED NOT DETECTED Final    Comment: Performed at [facility], [address] [place], [place]  [address]  MRSA PCR Screening     Status: Abnormal   Collection Time: 09/16/18  3:37 AM   Specimen: Nasopharyngeal  Result Value Ref Range Status   MRSA by PCR POSITIVE (A) NEGATIVE Final    Comment:        The GeneXpert MRSA Assay (FDA approved for NASAL specimens only), is one component of a comprehensive MRSA colonization surveillance program. It is not intended to diagnose MRSA infection nor to guide or monitor treatment for MRSA infections. RESULT CALLED TO, READ BACK BY AND VERIFIED WITH: LOFTIN,Q RN 09/16/2018 AT 2778 SKEEN,P Performed at [facility], [place] [address] [place], [place] [address]      Liver Function Tests: Recent Labs  Lab 09/15/18 1153 09/19/18 0421  AST 51* 42*  ALT  44 53*  ALKPHOS 59 73  BILITOT 0.9 1.0  PROT 7.3 9.2*  ALBUMIN 3.3* 3.4*    Cardiac Enzymes: Recent Labs  Lab 09/15/18 1153  TROPONINI <0.03   BNP (last 3 results) Recent Labs    09/15/18 1153 09/17/18 1445  BNP 76.7 178.8*    ProBNP (last 3 results) No results for input(s): PROBNP in the last 8760 hours.    Studies: Dg Chest 2 View  Result Date: 09/20/2018 CLINICAL DATA:  Shortness of breath EXAM: CHEST - 2 VIEW COMPARISON:  Same day prior examination FINDINGS: Slight interval increase in diffuse interstitial pulmonary opacity, perhaps reflecting worsening edema. There is no clear right basilar pneumothorax; an unusual, oblique linear opacity is again noted over the mid right hemithorax raising the possibility of a right basilar pneumothorax. Lateral view is not revealing. Disc degenerative disease and ankylosis of the thoracic spine. IMPRESSION: 1. Slight interval increase in diffuse interstitial pulmonary opacity, perhaps reflecting worsening edema. 2. There is no clear right basilar pneumothorax; an unusual, oblique linear opacity is again noted over the mid right hemithorax raising the possibility of a right basilar pneumothorax. Lateral view is not revealing. If  there is high clinical concern for pneumothorax, CT could be used for definitive evaluation. Electronically Signed   By: Eddie Candle M.D.   On: 09/20/2018 13:27   Dg Chest Port 1 View  Result Date: 09/20/2018 CLINICAL DATA:  Pulmonary edema EXAM: PORTABLE CHEST 1 VIEW COMPARISON:  09/17/2018 FINDINGS: Diffuse bilateral interstitial thickening. Subtle lucency at the right lung base which may be artifactual versus a basilar pneumothorax. Stable cardiomediastinal silhouette. No aggressive osseous lesion. IMPRESSION: 1. Subtle lucency at the right lung base which may be artifactual versus a basilar pneumothorax. Recommend repeat in 24 hours or a PA and lateral x-ray. Electronically Signed   By: Kathreen Devoid   On: 09/20/2018 10:08    Scheduled Meds: . chlorhexidine  15 mL Mouth Rinse BID  . Chlorhexidine Gluconate Cloth  6 each Topical Q0600  . DULoxetine  120 mg Oral Daily  . feeding supplement (PRO-STAT SUGAR FREE 64)  30 mL Oral TID BM  . ferrous sulfate  325 mg Oral QPC supper  . folic acid  1 mg Oral Daily  . furosemide  60 mg Intravenous Q12H  . heparin  5,000 Units Subcutaneous Q8H  . insulin aspart  0-15 Units Subcutaneous TID WC  . insulin glargine  18 Units Subcutaneous QHS  . levothyroxine  125 mcg Oral QAC breakfast  . mouth rinse  15 mL Mouth Rinse q12n4p  . metoprolol tartrate  100 mg Oral BID  . multivitamin with minerals  1 tablet Oral Daily  . mupirocin ointment  1 application Nasal BID  . NIFEdipine  30 mg Oral QPC supper  . polyethylene glycol  17 g Oral BID  . pregabalin  75 mg Oral BID  . Ensure Max Protein  11 oz Oral Daily  . senna  2 tablet Oral QHS  . ascorbic acid  500 mg Oral Daily  . zinc sulfate  220 mg Oral Daily    Admission status: Inpatient: Based on patients clinical presentation and evaluation of above clinical data, I have made determination that patient meets Inpatient criteria at this time.   Time spent: 25 min  [facility] Pager [phone]. If 7PM-7AM, please contact night-coverage at [email or web address], Office  [phone]  password [email or web address]  09/20/2018, 1:30 PM  LOS: 5 days

## 2018-09-20 NOTE — TOC Initial Note (Signed)
Transition of Care [facility]) - Initial/Assessment Note    Patient Details  Name: [name] MRN: [id number] Date of Birth: May 30, 1950  [facility]) CM/SW Contact:    Benard Halsted, LCSW Phone Number: 09/20/2018, 4:23 PM  Clinical Narrative:                 CSW received voicemail from patient's Social worker, Ebony, with [employer]. CSW returned call and left voicemail. CSW also received request to contact patient's spouse. CSW spoke with him and he requested CSW to contact his daughter. CSW spoke with patient's daughter, Gerhard Perches. She reported that patient has been bed bound for awhile and provided medical history. She requested that the staff keep her updated on patient's care; she is concerned that patient's pain is affecting her oxygen levels. She expressed appreciation for Verna Czech, and Dr. Darrick Meigs for their kindness. She reports that they are not interested in SNF placement if it is recommended and that patient's spouse has cared for her at home for years and will continue at discharge. CSW will continue to follow for possible home health needs.      Barriers to Discharge: Continued Medical Work up   Patient Goals and CMS Choice     Choice offered to / list presented to : Patient, Spouse, Adult Children  Expected Discharge Plan and Services   In-house Referral: Clinical Social Work                                            Prior Living Arrangements/Services   Lives with:: Spouse Patient language and need for interpreter reviewed:: Yes Do you feel safe going back to the place where you live?: Yes      Need for Family Participation in Patient Care: Yes (Comment) Care giver support system in place?: Yes (comment)   Criminal Activity/Legal Involvement Pertinent to Current Situation/Hospitalization: No - Comment as needed  Activities of Daily Living      Permission Sought/Granted Permission sought to share information with : Family  Supports Permission granted to share information with : Yes, Verbal Permission Granted  Share Information with NAME: Ross/Ursula     Permission granted to share info w Relationship: Spouse/Daughter  Permission granted to share info w Contact Information: [phone]  Emotional Assessment Appearance:: Appears stated age     Orientation: : Oriented to Self, Oriented to Place, Oriented to Situation Alcohol / Substance Use: Not Applicable Psych Involvement: No (comment)  Admission diagnosis:  Sepsis, due to unspecified organism, unspecified whether acute organ dysfunction present [facility]) [A41.9] Patient Active Problem List   Diagnosis Date Noted  . Acute respiratory failure with hypoxia and hypercapnia (HCC)   . Left perineal ischial pressure ulcer 09/16/2018  . Bacteremia due to Klebsiella pneumoniae 09/16/2018  . Sepsis due to urinary tract infection ([place]) 09/15/2018  . Diabetes type 2, uncontrolled ([place]) 04/26/2017  . Urinary tract infection associated with cystostomy catheter ([place])   . Medication monitoring encounter 12/03/2015  . Hypertension   . History of nephrolithiasis   . AKI (acute kidney injury) ([place]) 08/10/2015  . Sepsis ([place]) 08/10/2015  . Hypotension 08/10/2015  . Altered mental status 08/10/2015  . Leukocytosis 08/10/2015  . Kidney stones 08/10/2015  . Acute kidney injury ([place])   . Encephalopathy acute   . Osteomyelitis ([place]) 11/06/2014  . Sacral decubitus ulcer   . Paraplegia ([place]) 11/01/2014  .  Sacral ulcer ([place]) 10/31/2014  . Hypothyroidism 10/31/2014  . Breast cancer of lower-inner quadrant of right female breast ([place]) 12/22/2012  . HTN (hypertension) 04/15/2012  . Left knee DJD 04/13/2012    Class: Chronic   PCP:  [name], MD Pharmacy:   [id number] [place], [place] [address] [place] [address] Phone: [phone] Fax: [phone]     Social Determinants of Health (SDOH)  Interventions    Readmission Risk Interventions No flowsheet data found.

## 2018-09-20 NOTE — Progress Notes (Signed)
Bipap removed at this time to allow for a break.  Rt placed pt on 10lpm [place] salter.  Pt is stable at this time. Rt will monitor.

## 2018-09-20 NOTE — Progress Notes (Signed)
Patient is stable on 4L [place] nasal cannula with no respiratory distress noted. BIPAP is not needed at this time. RT will monitor as needed.

## 2018-09-20 NOTE — Progress Notes (Signed)
Patient off unit for CT. Patient A&O x4. No distress noted.

## 2018-09-20 NOTE — Progress Notes (Signed)
Updated family patient is off of Bipap on highflow oxygen and need for follow up xray. Patient had loose stool, peri care provided, foley care provided. Will continue to monitor.

## 2018-09-21 LAB — BASIC METABOLIC PANEL
Anion gap: 15 (ref 5–15)
BUN: 17 mg/dL (ref 8–23)
CO2: 22 mmol/L (ref 22–32)
Calcium: 9.1 mg/dL (ref 8.9–10.3)
Chloride: 104 mmol/L (ref 98–111)
Creatinine, Ser: 0.71 mg/dL (ref 0.44–1.00)
GFR calc Af Amer: >60 mL/min (ref >60)
GFR calc non Af Amer: >60 mL/min (ref >60)
Glucose, Bld: 182 mg/dL — ABNORMAL HIGH (ref 70–99)
Potassium: 3.5 mmol/L (ref 3.5–5.1)
Sodium: 141 mmol/L (ref 135–145)

## 2018-09-21 LAB — CBC
HCT: 46.8 % — ABNORMAL HIGH (ref 36.0–46.0)
Hemoglobin: 15.1 g/dL — ABNORMAL HIGH (ref 12.0–15.0)
MCH: 30.3 pg (ref 26.0–34.0)
MCHC: 32.3 g/dL (ref 30.0–36.0)
MCV: 93.8 fL (ref 80.0–100.0)
Platelets: 339 10*3/uL (ref 150–400)
RBC: 4.99 MIL/uL (ref 3.87–5.11)
RDW: 13.4 % (ref 11.5–15.5)
WBC: 10.5 10*3/uL (ref 4.0–10.5)
nRBC: 0 % (ref 0.0–0.2)

## 2018-09-21 LAB — GLUCOSE, CAPILLARY
Glucose-Capillary: 233 mg/dL — ABNORMAL HIGH (ref 70–99)
Glucose-Capillary: 196 mg/dL — ABNORMAL HIGH (ref 70–99)
Glucose-Capillary: 147 mg/dL — ABNORMAL HIGH (ref 70–99)
Glucose-Capillary: 172 mg/dL — ABNORMAL HIGH (ref 70–99)

## 2018-09-21 MED ORDER — POTASSIUM CHLORIDE CRYS ER 20 MEQ PO TBCR
40.0000 meq | EXTENDED_RELEASE_TABLET | ORAL | Status: AC
  Administered 2018-09-21 (×2): 40 meq via ORAL
  Filled 2018-09-21 (×2): qty 2

## 2018-09-21 NOTE — Progress Notes (Addendum)
[facility]  PROGRESS NOTE  [name] YJE:[id number] DOB: Aug 25, 1950 DOA: 09/15/2018 PCP: [name], MD   Brief HPI:   68 year old female came to ED with fatigue, lethargy, fever.  She has a history of diabetes mellitus type 2, anemia, peripheral neuropathy, GERD, hypertension, hypothyroidism, paraplegia, sacral decubitus, hyperlipidemia, kidney stone.  She has suprapubic catheter due to neurogenic bladder.  In the ED patient was lethargic but arousable.  UA was found positive for UTI.  CT abdomen pelvis demonstrated likely fecal impaction and scattered stool and gas throughout the chronic confines.  Also demonstrate likely osteomyelitis involving left ischium related to chronic sacral decubitus ulcer.    Subjective   Patient seen and examined, she is currently on BiPAP.  On 5 L of high flow nasal cannula.  O2 sats 96-98%.  Breathing is improved.   Assessment/Plan:     1. Sepsis due to UTI-patient has Klebsiella bacteremia also Klebsiella and E. coli growing in the urine culture.  Both E. coli and Klebsiella pneumoniae are sensitive to ceftriaxone.  Ceftriaxone discontinued as of today.  Completed 7 days of therapy as per ID recommendations.    2. Klebsiella pneumonia bacteremia-source likely urine.  ID does not feel that patient has ischial osteomyelitis. Consider MRI or bone biopsy if the wound does not heal at the ischium.  Continue ceftriaxone as above.  3. Acute hypoxic/hypercapnic respiratory failure-slowly improving, patient developed  hypoxic respiratory failure initial ABG showed  pH 7.014, PCO2 93.2, PO2 74.1, reflecting acute respiratory acidosis.  Improved with BiPAP, repeat ABG showed pH 7.355, PCO2 47.1.  Patient diuresed well with IV Lasix.  Her breathing got worse, was 8 to 10 L of 12 L of oxygen high flow nasal cannula, PCCM was consulted for assistance.  PCCM recommended to continue with BiPAP and IV diuresis.  Patient is slowly improving.  She is currently off  BiPAP, she is on 5 L oxygen via high flow nasal cannula..  4. ?  Right basilar pneumothorax-seen on portable chest x-ray done this morning.  Recommended to view to confirm pneumothorax.  Chest x-ray PA and lateral was ordered which also was unable to confirm pneumothorax.  Discussed with Dr. Lamonte Sakai, pulmonary he recommends getting CT chest without contrast to confirm pneumothorax.  CT chest does not show any pneumothorax.  5. Acute on chronic diastolic CHF-patient likely has worsening of chronic diastolic CHF with hypertensive urgency.  Continue Lasix 60 mg IV every 12 hours.  Net -7.8  L.  6. Hypertension-improved after starting on Lasix.  Continue metoprolol 100 g p.o. twice daily, nifedipine 30 mg p.o. daily  7. Diabetes mellitus type 2-blood glucose is elevated in 200s.  Continue moderate sliding scale insulin, dose of Lantus increased to 18 units subcu daily.   8. Sacral decubitus ulcer-wound care consulted, Continue wound care per nursing.  9. Hypomagnesemia-magnesium was 1.4 yesterday, replaced. Repeat magnesium  is 1.9.     CBG: Recent Labs  Lab 09/20/18 1304 09/20/18 1614 09/20/18 2229 09/21/18 0859 09/21/18 1157  GLUCAP 205* 149* 229* 233* 196*    CBC: Recent Labs  Lab 09/15/18 1153  09/15/18 1702 09/15/18 1739 09/17/18 0326 09/18/18 1903 09/19/18 0421 09/21/18 0304  WBC 16.8*  --  11.4*  --  10.5 10.1 7.9 10.5  NEUTROABS 15.6*  --   --   --   --   --   --   --   HGB 13.9   < > 12.5 12.2 14.0 14.1 15.3* 15.1*  HCT 44.1   < >  39.6 36.0 43.1 42.8 46.9* 46.8*  MCV 96.7  --  98.0  --  94.7 92.8 93.4 93.8  PLT 192  --  156  --  191 216 256 339   < > = values in this interval not displayed.    Basic Metabolic Panel: Recent Labs  Lab 09/15/18 1702  09/17/18 0326 09/18/18 1351 09/19/18 0421 09/20/18 0251 09/21/18 0304  NA  --    < > 135 138 138 141 141  K  --    < > 4.6 4.6 3.6 3.5 3.5  CL  --   --  101 104 99 103 104  CO2  --   --  24 19* 24 25 22    GLUCOSE  --   --  322* 207* 234* 203* 182*  BUN  --   --  16 14 12 15 17   CREATININE 1.23*  --  1.13* 0.87 0.82 0.77 0.71  CALCIUM  --   --  8.6* 8.5* 9.3 9.3 9.1  MG 1.4*  --  1.9  --   --  2.2  --   PHOS 2.6  --   --   --   --   --   --    < > = values in this interval not displayed.     DVT prophylaxis: Heparin  Code Status: Full code  Family Communication: Discussed with patient's husband and daughter on phone.  Disposition Plan: likely home  In next 2-3 days.  She refuses rehab at [facility].   Consultants:  PCCM    Antibiotics:   Anti-infectives (From admission, onward)   Start     Dose/Rate Route Frequency Ordered Stop   09/17/18 1500  vancomycin (VANCOCIN) 1,250 mg in sodium chloride 0.9 % 250 mL IVPB  Status:  Discontinued     1,250 mg 166.7 mL/hr over 90 Minutes Intravenous Every 48 hours 09/15/18 1412 09/16/18 1321   09/16/18 2000  cefTRIAXone (ROCEPHIN) 2 g in sodium chloride 0.9 % 100 mL IVPB     2 g 200 mL/hr over 30 Minutes Intravenous Every 24 hours 09/16/18 1105 09/21/18 2359   09/16/18 0200  ceFEPIme (MAXIPIME) 2 g in sodium chloride 0.9 % 100 mL IVPB  Status:  Discontinued     2 g 200 mL/hr over 30 Minutes Intravenous Every 12 hours 09/15/18 1412 09/16/18 1105   09/15/18 1700  ceFEPIme (MAXIPIME) 2 g in sodium chloride 0.9 % 100 mL IVPB  Status:  Discontinued     2 g 200 mL/hr over 30 Minutes Intravenous  Once 09/15/18 1649 09/15/18 1700   09/15/18 1400  vancomycin (VANCOCIN) 1,500 mg in sodium chloride 0.9 % 500 mL IVPB     1,500 mg 250 mL/hr over 120 Minutes Intravenous  Once 09/15/18 1346 09/15/18 1649   09/15/18 1345  ceFEPIme (MAXIPIME) 2 g in sodium chloride 0.9 % 100 mL IVPB     2 g 200 mL/hr over 30 Minutes Intravenous  Once 09/15/18 1343 09/15/18 1458   09/15/18 1345  metroNIDAZOLE (FLAGYL) IVPB 500 mg     500 mg 100 mL/hr over 60 Minutes Intravenous  Once 09/15/18 1343 09/15/18 1600   09/15/18 1345  vancomycin (VANCOCIN) IVPB 1000 mg/200 mL  premix  Status:  Discontinued     1,000 mg 200 mL/hr over 60 Minutes Intravenous  Once 09/15/18 1343 09/15/18 1346       Objective   Vitals:   09/21/18 0640 09/21/18 0641 09/21/18 0930 09/21/18 1353  BP: (!) 173/78  Marland Kitchen)  152/84 (!) 158/84  Pulse: 94 96 (!) 107 95  Resp: (!) 24 (!) 22 (!) 24 (!) 24  Temp:   98 F (36.7 C) 98.1 F (36.7 C)  TempSrc:   Oral Oral  SpO2: (!) 89% 93% 92% 98%  Weight:      Height:        Intake/Output Summary (Last 24 hours) at 09/21/2018 1610 Last data filed at 09/21/2018 1300 Gross per 24 hour  Intake 1060.44 ml  Output 2500 ml  Net -1439.56 ml   Filed Weights   09/15/18 1538 09/15/18 2237 09/21/18 0500  Weight: 90.7 kg 100.1 kg 89.4 kg     Physical Examination:  General-appears in no acute distress Heart-S1-S2, regular, no murmur auscultated Lungs-scattered rhonchi bilaterally Abdomen-soft, nontender, no organomegaly Extremities-no edema in the lower extremities Neuro-alert, oriented x3,  paraplegia  Data Reviewed: I have personally reviewed following labs and imaging studies   Recent Results (from the past 240 hour(s))  Urine culture     Status: Abnormal   Collection Time: 09/15/18 11:53 AM   Specimen: Urine, Random  Result Value Ref Range Status   Specimen Description URINE, RANDOM  Final   Special Requests   Final    NONE Performed at [facility], [address], [place], [place] [address]    Culture (A)  Final    >=100,000 COLONIES/mL KLEBSIELLA PNEUMONIAE >=100,000 COLONIES/mL ESCHERICHIA COLI    Report Status 09/18/2018 FINAL  Final   Organism ID, Bacteria KLEBSIELLA PNEUMONIAE (A)  Final   Organism ID, Bacteria ESCHERICHIA COLI (A)  Final      Susceptibility   Escherichia coli - MIC*    AMPICILLIN <=2 SENSITIVE Sensitive     CEFAZOLIN <=4 SENSITIVE Sensitive     CEFTRIAXONE <=1 SENSITIVE Sensitive     CIPROFLOXACIN <=0.25 SENSITIVE Sensitive     GENTAMICIN <=1 SENSITIVE Sensitive     IMIPENEM <=0.25  SENSITIVE Sensitive     NITROFURANTOIN <=16 SENSITIVE Sensitive     TRIMETH/SULFA <=20 SENSITIVE Sensitive     AMPICILLIN/SULBACTAM <=2 SENSITIVE Sensitive     PIP/TAZO <=4 SENSITIVE Sensitive     Extended ESBL NEGATIVE Sensitive     * >=100,000 COLONIES/mL ESCHERICHIA COLI   Klebsiella pneumoniae - MIC*    AMPICILLIN >=32 RESISTANT Resistant     CEFAZOLIN <=4 SENSITIVE Sensitive     CEFTRIAXONE <=1 SENSITIVE Sensitive     CIPROFLOXACIN <=0.25 SENSITIVE Sensitive     GENTAMICIN <=1 SENSITIVE Sensitive     IMIPENEM <=0.25 SENSITIVE Sensitive     NITROFURANTOIN 128 RESISTANT Resistant     TRIMETH/SULFA <=20 SENSITIVE Sensitive     AMPICILLIN/SULBACTAM >=32 RESISTANT Resistant     PIP/TAZO <=4 SENSITIVE Sensitive     Extended ESBL NEGATIVE Sensitive     * >=100,000 COLONIES/mL KLEBSIELLA PNEUMONIAE  SARS Coronavirus 2 (CEPHEID- Performed in [place] hospital lab), Hosp Order     Status: None   Collection Time: 09/15/18 11:54 AM   Specimen: Nasopharyngeal Swab  Result Value Ref Range Status   SARS Coronavirus 2 NEGATIVE NEGATIVE Final    Comment: (NOTE) If result is NEGATIVE SARS-CoV-2 target nucleic acids are NOT DETECTED. The SARS-CoV-2 RNA is generally detectable in upper and lower  respiratory specimens during the acute phase of infection. The lowest  concentration of SARS-CoV-2 viral copies this assay can detect is 250  copies / mL. A negative result does not preclude SARS-CoV-2 infection  and should not be used as the sole basis for  treatment or other  patient management decisions.  A negative result may occur with  improper specimen collection / handling, submission of specimen other  than nasopharyngeal swab, presence of viral mutation(s) within the  areas targeted by this assay, and inadequate number of viral copies  (<250 copies / mL). A negative result must be combined with clinical  observations, patient history, and epidemiological information. If result is  POSITIVE SARS-CoV-2 target nucleic acids are DETECTED. The SARS-CoV-2 RNA is generally detectable in upper and lower  respiratory specimens dur ing the acute phase of infection.  Positive  results are indicative of active infection with SARS-CoV-2.  Clinical  correlation with patient history and other diagnostic information is  necessary to determine patient infection status.  Positive results do  not rule out bacterial infection or co-infection with other viruses. If result is PRESUMPTIVE POSTIVE SARS-CoV-2 nucleic acids MAY BE PRESENT.   A presumptive positive result was obtained on the submitted specimen  and confirmed on repeat testing.  While 2019 novel coronavirus  (SARS-CoV-2) nucleic acids may be present in the submitted sample  additional confirmatory testing may be necessary for epidemiological  and / or clinical management purposes  to differentiate between  SARS-CoV-2 and other Sarbecovirus currently known to infect humans.  If clinically indicated additional testing with an alternate test  methodology [phone]) is advised. The SARS-CoV-2 RNA is generally  detectable in upper and lower respiratory sp ecimens during the acute  phase of infection. The expected result is Negative. Fact Sheet for Patients:  StrictlyIdeas.no Fact Sheet for Healthcare Providers: BankingDealers.co.za This test is not yet approved or cleared by the [place] FDA and has been authorized for detection and/or diagnosis of SARS-CoV-2 by FDA under an Emergency Use Authorization (EUA).  This EUA will remain in effect (meaning this test can be used) for the duration of the COVID-19 declaration under Section 564(b)(1) of the Act, 21 U.S.C. section 360bbb-3(b)(1), unless the authorization is terminated or revoked sooner. Performed at [facility], [place] [address] [place], [place] [address]   Blood Culture (routine x 2)     Status: Abnormal    Collection Time: 09/15/18 11:58 AM   Specimen: BLOOD  Result Value Ref Range Status   Specimen Description BLOOD BLOOD RIGHT FOREARM  Final   Special Requests   Final    BOTTLES DRAWN AEROBIC AND ANAEROBIC Blood Culture adequate volume   Culture  Setup Time   Final    GRAM NEGATIVE RODS AEROBIC BOTTLE ONLY CRITICAL VALUE NOTED.  VALUE IS CONSISTENT WITH PREVIOUSLY REPORTED AND CALLED VALUE.    Culture (A)  Final    KLEBSIELLA PNEUMONIAE SUSCEPTIBILITIES PERFORMED ON PREVIOUS CULTURE WITHIN THE LAST 5 DAYS. Performed at [facility], [place] [address] [place], [place] [address]    Report Status 09/18/2018 FINAL  Final  Blood Culture (routine x 2)     Status: Abnormal   Collection Time: 09/15/18 12:36 PM   Specimen: BLOOD  Result Value Ref Range Status   Specimen Description BLOOD SITE NOT SPECIFIED  Final   Special Requests   Final    BOTTLES DRAWN AEROBIC AND ANAEROBIC Blood Culture results may not be optimal due to an inadequate volume of blood received in culture bottles   Culture  Setup Time   Final    IN BOTH AEROBIC AND ANAEROBIC BOTTLES Organism ID to follow GRAM NEGATIVE RODS CRITICAL RESULT CALLED TO, READ BACK BY AND VERIFIED WITH: [place] AT [place] ON [id number]  BY SJW Performed at [facility], [place] [address], [place], [place] [address]    Culture KLEBSIELLA PNEUMONIAE (A)  Final   Report Status 09/18/2018 FINAL  Final   Organism ID, Bacteria KLEBSIELLA PNEUMONIAE  Final      Susceptibility   Klebsiella pneumoniae - MIC*    AMPICILLIN >=32 RESISTANT Resistant     CEFAZOLIN <=4 SENSITIVE Sensitive     CEFEPIME <=1 SENSITIVE Sensitive     CEFTAZIDIME <=1 SENSITIVE Sensitive     CEFTRIAXONE <=1 SENSITIVE Sensitive     CIPROFLOXACIN <=0.25 SENSITIVE Sensitive     GENTAMICIN <=1 SENSITIVE Sensitive     IMIPENEM 0.5 SENSITIVE Sensitive     TRIMETH/SULFA <=20 SENSITIVE Sensitive     AMPICILLIN/SULBACTAM >=32 RESISTANT Resistant     PIP/TAZO 8 SENSITIVE  Sensitive     Extended ESBL NEGATIVE Sensitive     * KLEBSIELLA PNEUMONIAE  Blood Culture ID Panel (Reflexed)     Status: Abnormal   Collection Time: 09/15/18 12:36 PM  Result Value Ref Range Status   Enterococcus species NOT DETECTED NOT DETECTED Final   Listeria monocytogenes NOT DETECTED NOT DETECTED Final   Staphylococcus species NOT DETECTED NOT DETECTED Final   Staphylococcus aureus (BCID) NOT DETECTED NOT DETECTED Final   Streptococcus species NOT DETECTED NOT DETECTED Final   Streptococcus agalactiae NOT DETECTED NOT DETECTED Final   Streptococcus pneumoniae NOT DETECTED NOT DETECTED Final   Streptococcus pyogenes NOT DETECTED NOT DETECTED Final   Acinetobacter baumannii NOT DETECTED NOT DETECTED Final   Enterobacteriaceae species DETECTED (A) NOT DETECTED Final    Comment: Enterobacteriaceae represent a large family of gram-negative bacteria, not a single organism. CRITICAL RESULT CALLED TO, READ BACK BY AND VERIFIED WITH: MCCARTHY PHARMD AT 0800 ON [id number] BY SJW    Enterobacter cloacae complex NOT DETECTED NOT DETECTED Final   Escherichia coli NOT DETECTED NOT DETECTED Final   Klebsiella oxytoca NOT DETECTED NOT DETECTED Final   Klebsiella pneumoniae DETECTED (A) NOT DETECTED Final    Comment: CRITICAL RESULT CALLED TO, READ BACK BY AND VERIFIED WITH: MCCARTHY PHARMD AT 0800 ON [id number] BY SJW    Proteus species NOT DETECTED NOT DETECTED Final   Serratia marcescens NOT DETECTED NOT DETECTED Final   Carbapenem resistance NOT DETECTED NOT DETECTED Final   Haemophilus influenzae NOT DETECTED NOT DETECTED Final   Neisseria meningitidis NOT DETECTED NOT DETECTED Final   Pseudomonas aeruginosa NOT DETECTED NOT DETECTED Final   Candida albicans NOT DETECTED NOT DETECTED Final   Candida glabrata NOT DETECTED NOT DETECTED Final   Candida krusei NOT DETECTED NOT DETECTED Final   Candida parapsilosis NOT DETECTED NOT DETECTED Final   Candida tropicalis NOT DETECTED NOT DETECTED  Final    Comment: Performed at [facility], [address], [place], [place] [address]  MRSA PCR Screening     Status: Abnormal   Collection Time: 09/16/18  3:37 AM   Specimen: Nasopharyngeal  Result Value Ref Range Status   MRSA by PCR POSITIVE (A) NEGATIVE Final    Comment:        The GeneXpert MRSA Assay (FDA approved for NASAL specimens only), is one component of a comprehensive MRSA colonization surveillance program. It is not intended to diagnose MRSA infection nor to guide or monitor treatment for MRSA infections. RESULT CALLED TO, READ BACK BY AND VERIFIED WITH: LOFTIN,Q RN 09/16/2018 AT 8182 SKEEN,P Performed at [facility], [place] [address] [place], [place] [address]  Liver Function Tests: Recent Labs  Lab 09/15/18 1153 09/19/18 0421  AST 51* 42*  ALT 44 53*  ALKPHOS 59 73  BILITOT 0.9 1.0  PROT 7.3 9.2*  ALBUMIN 3.3* 3.4*    Cardiac Enzymes: Recent Labs  Lab 09/15/18 1153  TROPONINI <0.03   BNP (last 3 results) Recent Labs    09/15/18 1153 09/17/18 1445  BNP 76.7 178.8*    ProBNP (last 3 results) No results for input(s): PROBNP in the last 8760 hours.    Studies: Dg Chest 2 View  Result Date: 09/20/2018 CLINICAL DATA:  Shortness of breath EXAM: CHEST - 2 VIEW COMPARISON:  Same day prior examination FINDINGS: Slight interval increase in diffuse interstitial pulmonary opacity, perhaps reflecting worsening edema. There is no clear right basilar pneumothorax; an unusual, oblique linear opacity is again noted over the mid right hemithorax raising the possibility of a right basilar pneumothorax. Lateral view is not revealing. Disc degenerative disease and ankylosis of the thoracic spine. IMPRESSION: 1. Slight interval increase in diffuse interstitial pulmonary opacity, perhaps reflecting worsening edema. 2. There is no clear right basilar pneumothorax; an unusual, oblique linear opacity is again noted over the mid right hemithorax  raising the possibility of a right basilar pneumothorax. Lateral view is not revealing. If there is high clinical concern for pneumothorax, CT could be used for definitive evaluation. Electronically Signed   By: Eddie Candle M.D.   On: 09/20/2018 13:27   Ct Chest Wo Contrast  Result Date: 09/20/2018 CLINICAL DATA:  Right basilar pneumothorax on prior chest x-ray. EXAM: CT CHEST WITHOUT CONTRAST TECHNIQUE: Multidetector CT imaging of the chest was performed following the standard protocol without IV contrast. COMPARISON:  Chest x-rays earlier today FINDINGS: Cardiovascular: Heart is normal size. Scattered aortic calcifications. No evidence of aortic aneurysm. Mediastinum/Nodes: Calcified left hilar and mediastinal lymph nodes compatible with old granulomatous disease. No mediastinal, hilar, or axillary adenopathy. Lungs/Pleura: Calcified granulomas in the right upper lobe and superior segment of the left lower lobe. Extensive ground-glass airspace opacities in both upper lobes and superior segments of the lower lobes. Ground-glass opacities scattered in both lower lobes and right middle lobe. No effusions. No pneumothorax as questioned on prior x-ray. Upper Abdomen: Calcifications in the spleen compatible with old granulomatous disease. Diffuse fatty infiltration of the liver. Musculoskeletal: Chest wall soft tissues are unremarkable. No acute bony abnormality. IMPRESSION: No pneumothorax as questioned on prior chest x-rays. Old granulomatous disease. Ground-glass opacities most confluent in the upper lobes, also noted in the lower lobes and right middle lobe. Given the normal heart size, this is concerning for infection, less likely edema. Fatty infiltration of the liver. Aortic Atherosclerosis (ICD10-I70.0). Electronically Signed   By: Rolm Baptise M.D.   On: 09/20/2018 21:42   Dg Chest Port 1 View  Result Date: 09/20/2018 CLINICAL DATA:  Pulmonary edema EXAM: PORTABLE CHEST 1 VIEW COMPARISON:  09/17/2018  FINDINGS: Diffuse bilateral interstitial thickening. Subtle lucency at the right lung base which may be artifactual versus a basilar pneumothorax. Stable cardiomediastinal silhouette. No aggressive osseous lesion. IMPRESSION: 1. Subtle lucency at the right lung base which may be artifactual versus a basilar pneumothorax. Recommend repeat in 24 hours or a PA and lateral x-ray. Electronically Signed   By: Kathreen Devoid   On: 09/20/2018 10:08    Scheduled Meds: . chlorhexidine  15 mL Mouth Rinse BID  . DULoxetine  120 mg Oral Daily  . feeding supplement (PRO-STAT SUGAR FREE 64)  30 mL Oral TID BM  .  ferrous sulfate  325 mg Oral QPC supper  . folic acid  1 mg Oral Daily  . furosemide  60 mg Intravenous Q12H  . heparin  5,000 Units Subcutaneous Q8H  . insulin aspart  0-15 Units Subcutaneous TID WC  . insulin glargine  18 Units Subcutaneous QHS  . levothyroxine  125 mcg Oral QAC breakfast  . mouth rinse  15 mL Mouth Rinse q12n4p  . metoprolol tartrate  100 mg Oral BID  . multivitamin with minerals  1 tablet Oral Daily  . NIFEdipine  30 mg Oral QPC supper  . polyethylene glycol  17 g Oral BID  . pregabalin  75 mg Oral BID  . Ensure Max Protein  11 oz Oral Daily  . senna  2 tablet Oral QHS  . ascorbic acid  500 mg Oral Daily  . zinc sulfate  220 mg Oral Daily    Admission status: Inpatient: Based on patients clinical presentation and evaluation of above clinical data, I have made determination that patient meets Inpatient criteria at this time.   Time spent: 25 min  [facility] Pager [phone]. If 7PM-7AM, please contact night-coverage at [email or web address], Office  [phone]  password [email or web address]  09/21/2018, 4:10 PM  LOS: 6 days

## 2018-09-21 NOTE — Progress Notes (Signed)
Inpatient Diabetes Program Recommendations  AACE/ADA: New Consensus Statement on Inpatient Glycemic Control (2015)  Target Ranges:  Prepandial:   less than 140 mg/dL      Peak postprandial:   less than 180 mg/dL (1-2 hours)      Critically ill patients:  140 - 180 mg/dL   Lab Results  Component Value Date   GLUCAP 233 (H) 09/21/2018   HGBA1C 10.0 (H) 09/15/2018    Review of Glycemic Control Results for [name], [name] (MRN [id number]) as of 09/21/2018 11:38  Ref. Range 09/20/2018 22:29 09/21/2018 08:59  Glucose-Capillary Latest Ref Range: 70 - 99 mg/dL 229 (H) 233 (H)   Diabetes history: DM 2 Outpatient Diabetes medications: Metformin 250 mg BID Current orders for Inpatient glycemic control: Novolog 0-15 tid, Lantus 18 units QHS  Inpatient Diabetes Program Recommendations:     Consider increasing Lantus to 20 units QHS.  If post prandials >180 mg/dL, consider adding Novolog 3 units TID (assuming patient is consuming >50% of meal).   Thanks, Bronson Curb, MSN, RNC-OB Diabetes Coordinator [phone] (8a-5p)

## 2018-09-21 NOTE — Care Management Important Message (Signed)
Important Message  Patient Details  Name: [name] MRN: [id number] Date of Birth: 11-03-1950   Medicare Important Message Given:  Yes    [name] Circle 09/21/2018, 1:48 PM

## 2018-09-22 ENCOUNTER — Encounter (HOSPITAL_COMMUNITY): Payer: Self-pay | Admitting: Physician Assistant

## 2018-09-22 DIAGNOSIS — Z515 Encounter for palliative care: Secondary | ICD-10-CM

## 2018-09-22 DIAGNOSIS — I5033 Acute on chronic diastolic (congestive) heart failure: Secondary | ICD-10-CM

## 2018-09-22 DIAGNOSIS — M4712 Other spondylosis with myelopathy, cervical region: Secondary | ICD-10-CM

## 2018-09-22 DIAGNOSIS — I5031 Acute diastolic (congestive) heart failure: Secondary | ICD-10-CM

## 2018-09-22 DIAGNOSIS — Z7189 Other specified counseling: Secondary | ICD-10-CM

## 2018-09-22 LAB — BASIC METABOLIC PANEL
Anion gap: 13 (ref 5–15)
BUN: 17 mg/dL (ref 8–23)
CO2: 23 mmol/L (ref 22–32)
Calcium: 9.3 mg/dL (ref 8.9–10.3)
Chloride: 105 mmol/L (ref 98–111)
Creatinine, Ser: 0.78 mg/dL (ref 0.44–1.00)
GFR calc Af Amer: >60 mL/min (ref >60)
GFR calc non Af Amer: >60 mL/min (ref >60)
Glucose, Bld: 211 mg/dL — ABNORMAL HIGH (ref 70–99)
Potassium: 3.8 mmol/L (ref 3.5–5.1)
Sodium: 141 mmol/L (ref 135–145)

## 2018-09-22 LAB — GLUCOSE, CAPILLARY
Glucose-Capillary: 201 mg/dL — ABNORMAL HIGH (ref 70–99)
Glucose-Capillary: 217 mg/dL — ABNORMAL HIGH (ref 70–99)
Glucose-Capillary: 199 mg/dL — ABNORMAL HIGH (ref 70–99)
Glucose-Capillary: 165 mg/dL — ABNORMAL HIGH (ref 70–99)

## 2018-09-22 MED ORDER — BISACODYL 10 MG RE SUPP: 10.0000 mg | Freq: Every day | RECTAL | Status: DC | PRN

## 2018-09-22 MED ORDER — SENNOSIDES-DOCUSATE SODIUM 8.6-50 MG PO TABS
1.0000 | ORAL_TABLET | Freq: Two times a day (BID) | ORAL | Status: DC
  Administered 2018-09-22 – 2018-09-23 (×2): 1 via ORAL
  Filled 2018-09-22 (×2): qty 1

## 2018-09-22 MED ORDER — MOMETASONE FURO-FORMOTEROL FUM 100-5 MCG/ACT IN AERO
2.0000 | INHALATION_SPRAY | Freq: Two times a day (BID) | RESPIRATORY_TRACT | Status: DC
  Administered 2018-09-23: 2 via RESPIRATORY_TRACT
  Filled 2018-09-22: qty 8.8

## 2018-09-22 MED ORDER — IRBESARTAN 75 MG PO TABS
75.0000 mg | ORAL_TABLET | Freq: Every day | ORAL | Status: DC
  Administered 2018-09-22 – 2018-09-23 (×2): 75 mg via ORAL
  Filled 2018-09-22 (×2): qty 1

## 2018-09-22 MED ORDER — IPRATROPIUM-ALBUTEROL 0.5-2.5 (3) MG/3ML IN SOLN
3.0000 mL | Freq: Four times a day (QID) | RESPIRATORY_TRACT | Status: DC
  Filled 2018-09-22: qty 3

## 2018-09-22 MED ORDER — INSULIN GLARGINE 100 UNIT/ML ~~LOC~~ SOLN
20.0000 [IU] | Freq: Every day | SUBCUTANEOUS | Status: DC
  Administered 2018-09-22: 20 [IU] via SUBCUTANEOUS
  Filled 2018-09-22 (×2): qty 0.2

## 2018-09-22 MED ORDER — NEPRO/CARBSTEADY PO LIQD
237.0000 mL | Freq: Every day | ORAL | Status: DC
  Administered 2018-09-22: 237 mL via ORAL

## 2018-09-22 MED ORDER — IPRATROPIUM-ALBUTEROL 0.5-2.5 (3) MG/3ML IN SOLN: 3.0000 mL | RESPIRATORY_TRACT | Status: DC | PRN

## 2018-09-22 NOTE — Consult Note (Signed)
Consultation Note Date: 09/22/2018   Patient Name: [name]  DOB: Sep 13, 1950  MRN: [id number]  Age / Sex: 68 y.o., female  PCP: [name], MD Referring Physician: Aline August, MD  Reason for Consultation: Establishing goals of care  HPI/Patient Profile: 68 y.o. female  with past medical history of quadraplegia d/t cervical myelopathy, neurogenic bladder w/ suprapubic cath, HTN, HLD,  admitted on 09/15/2018 with fever and fatigue. Workup revealed sepsis bacteremia, +urine culture, recovery complicated by respiratory failure requiring bipap. She is now down to 2L O2. CT scan showed ground glass opacities concerning for infection. Patient reports weeks of productive cough prior to admission. Palliative medicine consulted for [place].    Clinical Assessment and Goals of Care:  I have reviewed medical records including EPIC notes, labs and imaging, received report from Dr. Starla Link, then met with the patient at bedside to discuss diagnosis prognosis, GOC, EOL wishes, disposition and options.  I introduced Palliative Medicine as specialized medical care for people living with serious illness. It focuses on providing relief from the symptoms and stress of a serious illness. The goal is to improve quality of life for both the patient and the family.  We discussed a brief life review of the patient. 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 She retired when she became disabled from her cervical myelopathy.   As far as functional and nutritional status- she reports a good appetite, her functional status is now at baseline with no change or decline in last year. She is bedbound, her husband cares for her, she has aides that come and assist with care and bathing three days a week. She feels she has good quality life.   Ms Canny declined to talk about advanced care planning. She  states her GOC is to discharge home and to be with her husband. PMT offered to arrange visit with her husband for ACP discussion, but patient declined. She agreed to followup visit tomorrow from PMT.   Primary Decision Maker PATIENT   SUMMARY OF RECOMMENDATIONS -Continue current level of care -PMT will visit patient tomorrow for f/u- patient would likely benefit from outpatient Palliative f/u for symptom control and continued GOC- will discuss with her tomorrow   Prognosis:    Unable to determine  Discharge Planning: Home with Home Health  Primary Diagnoses: Present on Admission: . Sepsis due to urinary tract infection ([place])   I have reviewed the medical record, interviewed the patient and family, and examined the patient. The following aspects are pertinent.  Past Medical History:  Diagnosis Date  . Anemia   . Arthritis    "all over" (04/13/2012)  . Cancer of right breast ([place]) 2011  . Cervical neuropathy    PERIPHERAL NEUROPATHY , HAD CERVICAL FUSION  . Diabetes mellitus without complication ([place])   . Difficult intubation    Fastrach #4 LMA then # 7 ETT used in 2006 [facility], cervical laminectomy)  . GERD (gastroesophageal reflux disease)   . History  of kidney stones   . Hypercholesteremia   . Hypertension   . Hypothyroidism   . Kidney stones   . Neurogenic bladder   . Osteomyelitis ([place])   . Paraplegia ([place])   . Peripheral neuropathy   . PONV (postoperative nausea and vomiting)   . Sacral decubitus ulcer 10/2014  . Sepsis due to urinary tract infection [facility])    Social History   Socioeconomic History  . Marital status: Married    Spouse name: Not on file  . Number of children: Not on file  . Years of education: Not on file  . Highest education level: Not on file  Occupational History  . Not on file  Social Needs  . Financial resource strain: Not on file  . Food insecurity    Worry: Not on file    Inability: Not on file  . Transportation needs    Medical:  Not on file    Non-medical: Not on file  Tobacco Use  . Smoking status: Former Smoker    Packs/day: 0.50    Years: 50.00    Pack years: 25.00    Types: Cigarettes    Quit date: 11/06/2014    Years since quitting: 3.8  . Smokeless tobacco: Never Used  Substance and Sexual Activity  . Alcohol use: No  . Drug use: No  . Sexual activity: Not Currently  Lifestyle  . Physical activity    Days per week: Not on file    Minutes per session: Not on file  . Stress: Not on file  Relationships  . Social Herbalist on phone: Not on file    Gets together: Not on file    Attends religious service: Not on file    Active member of club or organization: Not on file    Attends meetings of clubs or organizations: Not on file    Relationship status: Not on file  Other Topics Concern  . Not on file  Social History Narrative  . Not on file   Family History  Problem Relation Age of Onset  . Heart disease Father    Scheduled Meds: . chlorhexidine  15 mL Mouth Rinse BID  . DULoxetine  120 mg Oral Daily  . feeding supplement (NEPRO CARB STEADY)  237 mL Oral QHS  . ferrous sulfate  325 mg Oral QPC supper  . folic acid  1 mg Oral Daily  . furosemide  60 mg Intravenous Q12H  . heparin  5,000 Units Subcutaneous Q8H  . insulin aspart  0-15 Units Subcutaneous TID WC  . insulin glargine  20 Units Subcutaneous QHS  . irbesartan  75 mg Oral Daily  . levothyroxine  125 mcg Oral QAC breakfast  . mouth rinse  15 mL Mouth Rinse q12n4p  . metoprolol tartrate  100 mg Oral BID  . mometasone-formoterol  2 puff Inhalation BID  . multivitamin with minerals  1 tablet Oral Daily  . NIFEdipine  30 mg Oral QPC supper  . polyethylene glycol  17 g Oral BID  . pregabalin  75 mg Oral BID  . senna-docusate  1 tablet Oral BID  . ascorbic acid  500 mg Oral Daily  . zinc sulfate  220 mg Oral Daily   Continuous Infusions: . sodium chloride 10 mL/hr at 09/20/18 2035   PRN Meds:.sodium chloride, baclofen,  bisacodyl, docusate sodium, ipratropium-albuterol, LORazepam, oxyCODONE-acetaminophen **AND** oxyCODONE Medications Prior to Admission:  Prior to Admission medications   Medication Sig Start Date End Date  Taking? Authorizing Provider  ascorbic acid (VITAMIN C) 500 MG tablet Take 1 tablet (500 mg total) by mouth daily. 11/07/14  Yes Francesca Oman, DO  baclofen (LIORESAL) 20 MG tablet Take 20 mg by mouth every 6 (six) hours as needed for muscle spasms.    Yes [provider]  docusate sodium (COLACE) 100 MG capsule Take 100 mg by mouth 2 (two) times daily as needed (FOR CONSTIPATION).    Yes [provider]  DULoxetine (CYMBALTA) 60 MG capsule Take 120 mg by mouth daily.    Yes [provider]  ferrous sulfate 325 (65 FE) MG tablet Take 325 mg by mouth daily after supper.    Yes [provider]  folic acid (FOLVITE) 1 MG tablet Take 1 mg by mouth daily.    Yes [provider]  furosemide (LASIX) 40 MG tablet Take 40 mg by mouth daily.    Yes [provider]  levothyroxine (SYNTHROID, LEVOTHROID) 125 MCG tablet Take 1 tablet (125 mcg total) by mouth daily before breakfast. Patient taking differently: Take 125 mcg by mouth daily.  11/07/14  Yes Francesca Oman, DO  metFORMIN (GLUCOPHAGE) 500 MG tablet Take 250 mg by mouth 2 (two) times daily with a meal.   Yes [provider]  metoprolol (LOPRESSOR) 100 MG tablet Take 100 mg by mouth 2 (two) times daily.  10/28/14  Yes [provider]  Morphine Sulfate ER 30 MG T12A Take 30 mg by mouth 2 (two) times daily.   Yes [provider]  Multiple Vitamin (MULTIVITAMIN WITH MINERALS) TABS tablet Take 1 tablet by mouth daily. 11/07/14  Yes Francesca Oman, DO  NIFEdipine (PROCARDIA XL/ADALAT-CC) 30 MG 24 hr tablet Take 30 mg by mouth daily after supper.    Yes [provider]  oxyCODONE-acetaminophen (PERCOCET) 10-325 MG tablet Take 1 tablet by mouth every 6 (six) hours as needed  for pain.    Yes [provider]  pregabalin (LYRICA) 75 MG capsule Take 75 mg by mouth 2 (two) times daily.  08/23/18  Yes [provider]  senna (SENOKOT) 8.6 MG tablet Take 2 tablets by mouth at bedtime.   Yes [provider]  zinc sulfate 220 MG capsule Take 1 capsule (220 mg total) by mouth daily. 11/07/14  Yes Francesca Oman, DO  ciprofloxacin (CIPRO) 500 MG tablet Take 1 tablet (500 mg total) by mouth 2 (two) times daily. Patient not taking: Reported on 09/15/2018 08/15/17   Isla Pence, MD   Allergies  Allergen Reactions  . Ivp Dye [Iodinated Diagnostic Agents] Other (See Comments)    "hot and sweaty and almost passed out"  . Aspirin Hives  . Sulfa Antibiotics Hives   Review of Systems  Constitutional: Negative for activity change and appetite change.  Respiratory: Positive for shortness of breath.   Cardiovascular: Negative for chest pain.  Musculoskeletal: Positive for arthralgias and myalgias.    Physical Exam Vitals signs and nursing note reviewed.  Pulmonary:     Effort: Pulmonary effort is normal.  Musculoskeletal:     Comments: +contractures  Skin:    General: Skin is warm and dry.  Neurological:     Mental Status: She is alert and oriented to person, place, and time.  Psychiatric:        Mood and Affect: Mood normal.        Behavior: Behavior normal.     Vital Signs: BP 135/68 (BP Location: Right Arm)   Pulse 91  Temp 98 F (36.7 C) (Oral)   Resp 19   Ht _0  (1.575 m)   Wt 89.4 kg   SpO2 98%   BMI 36.03 kg/m  Pain Scale: 0-10 POSS *See Group Information*: 1-Acceptable,Awake and alert Pain Score: 6    SpO2: SpO2: 98 % O2 Device:SpO2: 98 % O2 Flow Rate: .O2 Flow Rate (L/min): 2 L/min  IO: Intake/output summary:   Intake/Output Summary (Last 24 hours) at 09/22/2018 1642 Last data filed at 09/22/2018 0529 Gross per 24 hour  Intake 240 ml  Output 800 ml  Net -560 ml    LBM: Last BM Date: 09/22/18 Baseline Weight:  Weight: 90.7 kg Most recent weight: Weight: 89.4 kg     Palliative Assessment/Data: PPS: 30%     Thank you for this consult. Palliative medicine will continue to follow and assist as needed.   Time In: 1600 Time Out: 1715 Time Total: 75 minutes Greater than 50%  of this time was spent counseling and coordinating care related to the above assessment and plan.  Signed by: Mariana Kaufman, AGNP-C Palliative Medicine    Please contact Palliative Medicine Team phone at [phone] for questions and concerns.  For individual provider: See Shea Evans

## 2018-09-22 NOTE — Progress Notes (Signed)
Pt refused PAP for HS use.

## 2018-09-22 NOTE — Consult Note (Addendum)
Cardiology Consultation:   Patient ID: Marica Trentham; [id number]; May 03, 1950   Admit date: 09/15/2018 Date of Consult: 09/22/2018  Primary Care Provider: [name], MD Primary Cardiologist: Glenetta Hew, MD (new) Primary Electrophysiologist:  None  Chief Complaint: fever, fatigue  Patient Profile:   Alayzia Pavlock is a 68 y.o. female with a hx of paraplegia due to cervical surgery in 2006, sacral decubitus ulcer, HTN, HLD, breast CA, hypothyroidism, osteomyelitis, GERD, depression, obesity, suprapubic catheter due to neurogenic bladder, peripheral neuropathy who is being seen today for the evaluation of possible CHF at the request of Dr. Darrick Meigs.  History of Present Illness:   She has had prior admissions for sepsis due to UTI, osteomyelitis, metabolic encephalopathy, and sacral ulcer issues. She resides at home with her husband who cares for her 24/7. She was brought to the hospital from home via EMS with fever, fatigue and pulse ox 90% on RA at home. She was also lethargic with hypotension and lactic acidosis. She was admitted for sepsis felt due to UTI, AKI, UTI, fecal impaction on CT, and likely osteomyelitis involving left ischium related to chronic sacral decubitus ulcer. She has also been found to have Klebsiella pneumoniae bacteremia likely related to UTI. ID saw patient and did not feel she had ischial osteomyelitis but recommended MRI or bone bx if wound did not heal at ischium. Covid-19 testing was negative on admission.  Shortly after admission, she developed worsening hypoxic/hypercapneic respiratory failure requiring BIPAP. This was initially felt due to pulm edema and also component of narcotics. PCCM saw and signed off on 09/17/18. She has had eventual transition to high flow [place] but has required continued HFNC to maintain oxygenation. 2D echo 09/16/18 showed EF 50-55%, impaired diastolic relaxation, trivial AI, normal RV. BNP on 6/12 was 178 when pt developed respiratory distress.  She also had possible R basilar PTX no portable CXR. CT chest 09/20/18 showed ground glass opacities in the upper lobes, noted in lower lobes and right middle lobes, concerning for infection, less likely edema. Unclear baseline weight, last available 04/2017 222lb. She is was listed as 220 on admission, down to 197 today with -8.4L. Bp remains persistently elevated as well with sinus tachycardia on telemetry. Her O2 sat nadir this AM was 88% on 5L HFNC.   Past Medical History:  Diagnosis Date   Anemia    Arthritis    "all over" (04/13/2012)   Cancer of right breast ([place]) 2011   Cervical neuropathy    PERIPHERAL NEUROPATHY , HAD CERVICAL FUSION   Diabetes mellitus without complication ([place])    Difficult intubation    Fastrach #4 LMA then # 7 ETT used in 2006 [facility], cervical laminectomy)   GERD (gastroesophageal reflux disease)    History of kidney stones    Hypercholesteremia    Hypertension    Hypothyroidism    Kidney stones    Neurogenic bladder    Osteomyelitis (HCC)    Paraplegia (HCC)    Peripheral neuropathy    PONV (postoperative nausea and vomiting)    Sacral decubitus ulcer 10/2014   Sepsis due to urinary tract infection [facility])     Past Surgical History:  Procedure Laterality Date   ABDOMINAL HYSTERECTOMY  ~ [place] Right 2011   BREAST LUMPECTOMY Right 2011   CALDWELL LUC  ~ 2003   "benign tumor removed from up under gum" (04/13/2012)   DACROCYSTORHINOSTOMY  ~ 2000   "put stents in my tear  ducts; both eyes" (04/13/2012)   EYE SURGERY  2004   STENTS TO BIL EYES   I&D EXTREMITY Bilateral 11/03/2014   Procedure: IRRIGATION AND DEBRIDEMENT BILATERAL HEEL;  Surgeon: Irene Limbo, MD;  Location: [place];  Service: Plastics;  Laterality: Bilateral;   INCISION AND DRAINAGE OF WOUND Left 11/03/2014   Procedure: IRRIGATION AND DEBRIDEMENT SACRAL ULCER;  Surgeon: Irene Limbo, MD;  Location: [place];  Service: Plastics;   Laterality: Left;   JOINT REPLACEMENT     POSTERIOR CERVICAL LAMINECTOMY  2006   SKIN SPLIT GRAFT Left 08/05/2016   Procedure: SURGICAL PREP  FOR GRAFTING APPLICATION ACELL TO LEFT ISCHIUM;  Surgeon: Irene Limbo, MD;  Location: [place];  Service: Plastics;  Laterality: Left;   TONSILLECTOMY AND ADENOIDECTOMY  ~ 1954   TOTAL KNEE ARTHROPLASTY WITH REVISION COMPONENTS  04/13/2012   Procedure: TOTAL KNEE ARTHROPLASTY WITH REVISION COMPONENTS;  Surgeon: Hessie Dibble, MD;  Location: [place];  Service: Orthopedics;  Laterality: Left;     Inpatient Medications: Scheduled Meds:  chlorhexidine  15 mL Mouth Rinse BID   DULoxetine  120 mg Oral Daily   feeding supplement (NEPRO CARB STEADY)  237 mL Oral QHS   ferrous sulfate  325 mg Oral QPC supper   folic acid  1 mg Oral Daily   furosemide  60 mg Intravenous Q12H   heparin  5,000 Units Subcutaneous Q8H   insulin aspart  0-15 Units Subcutaneous TID WC   insulin glargine  18 Units Subcutaneous QHS   levothyroxine  125 mcg Oral QAC breakfast   mouth rinse  15 mL Mouth Rinse q12n4p   metoprolol tartrate  100 mg Oral BID   multivitamin with minerals  1 tablet Oral Daily   NIFEdipine  30 mg Oral QPC supper   polyethylene glycol  17 g Oral BID   pregabalin  75 mg Oral BID   senna  2 tablet Oral QHS   ascorbic acid  500 mg Oral Daily   zinc sulfate  220 mg Oral Daily   Continuous Infusions:  sodium chloride 10 mL/hr at 09/20/18 2035   PRN Meds: sodium chloride, baclofen, docusate sodium, LORazepam, oxyCODONE-acetaminophen **AND** oxyCODONE  Home Meds: Prior to Admission medications   Medication Sig Start Date End Date Taking? Authorizing Provider  ascorbic acid (VITAMIN C) 500 MG tablet Take 1 tablet (500 mg total) by mouth daily. 11/07/14  Yes Francesca Oman, DO  baclofen (LIORESAL) 20 MG tablet Take 20 mg by mouth every 6 (six) hours as needed for muscle spasms.    Yes [provider]  docusate sodium  (COLACE) 100 MG capsule Take 100 mg by mouth 2 (two) times daily as needed (FOR CONSTIPATION).    Yes [provider]  DULoxetine (CYMBALTA) 60 MG capsule Take 120 mg by mouth daily.    Yes [provider]  ferrous sulfate 325 (65 FE) MG tablet Take 325 mg by mouth daily after supper.    Yes [provider]  folic acid (FOLVITE) 1 MG tablet Take 1 mg by mouth daily.    Yes [provider]  furosemide (LASIX) 40 MG tablet Take 40 mg by mouth daily.    Yes [provider]  levothyroxine (SYNTHROID, LEVOTHROID) 125 MCG tablet Take 1 tablet (125 mcg total) by mouth daily before breakfast. Patient taking differently: Take 125 mcg by mouth daily.  11/07/14  Yes Francesca Oman, DO  metFORMIN (GLUCOPHAGE) 500 MG tablet Take 250 mg by mouth 2 (two)  times daily with a meal.   Yes [provider]  metoprolol (LOPRESSOR) 100 MG tablet Take 100 mg by mouth 2 (two) times daily.  10/28/14  Yes [provider]  Morphine Sulfate ER 30 MG T12A Take 30 mg by mouth 2 (two) times daily.   Yes [provider]  Multiple Vitamin (MULTIVITAMIN WITH MINERALS) TABS tablet Take 1 tablet by mouth daily. 11/07/14  Yes Francesca Oman, DO  NIFEdipine (PROCARDIA XL/ADALAT-CC) 30 MG 24 hr tablet Take 30 mg by mouth daily after supper.    Yes [provider]  oxyCODONE-acetaminophen (PERCOCET) 10-325 MG tablet Take 1 tablet by mouth every 6 (six) hours as needed for pain.    Yes [provider]  pregabalin (LYRICA) 75 MG capsule Take 75 mg by mouth 2 (two) times daily.  08/23/18  Yes [provider]  senna (SENOKOT) 8.6 MG tablet Take 2 tablets by mouth at bedtime.   Yes [provider]  zinc sulfate 220 MG capsule Take 1 capsule (220 mg total) by mouth daily. 11/07/14  Yes Francesca Oman, DO  ciprofloxacin (CIPRO) 500 MG tablet Take 1 tablet (500 mg total) by mouth 2 (two) times daily. Patient not taking: Reported on 09/15/2018 08/15/17    Isla Pence, MD    Allergies:    Allergies  Allergen Reactions   Ivp Dye [Iodinated Diagnostic Agents] Other (See Comments)    "hot and sweaty and almost passed out"   Aspirin Hives   Sulfa Antibiotics Hives    Social History:   Social History   Tobacco Use   Smoking status: Former Smoker    Packs/day: 0.50    Years: 50.00    Pack years: 25.00    Types: Cigarettes    Quit date: 11/06/2014    Years since quitting: 3.8   Smokeless tobacco: Never Used  Substance Use Topics   Alcohol use: No   Drug use: No   Social History   Social History Narrative   Not on file     Family History:   The patient's family history includes Heart disease in her father.  ROS:  Please see the history of present illness. All other ROS reviewed and negative.     Physical Exam/Data:   Vitals:   09/21/18 2240 09/22/18 0459 09/22/18 0523 09/22/18 0925  BP: (!) 160/78  (!) 159/77 (!) 150/74  Pulse: 97  83 99  Resp: 15  18 20   Temp:  97.9 F (36.6 C)  97.8 F (36.6 C)  TempSrc:  Oral  Oral  SpO2: 98%  95% 97%  Weight:      Height:        Intake/Output Summary (Last 24 hours) at 09/22/2018 1113 Last data filed at 09/22/2018 0529 Gross per 24 hour  Intake 450 ml  Output 800 ml  Net -350 ml   Last 3 Weights 09/21/2018 09/15/2018 09/15/2018  Weight (lbs) 197 lb 220 lb 10.9 oz 200 lb  Weight (kg) 89.359 kg 100.1 kg 90.719 kg    Body mass index is 36.03 kg/m.  Pending MD review - APP prenote done remotely to limit exposure during pandemic. General: Somewhat chronically ill-appearing woman in no acute distress.  Appears calm with no increased work of breathing. Head: Normocephalic, atraumatic, sclera non-icteric, no xanthomas, nares are without discharge.  Neck: Negative for carotid bruits. JVD not elevated -although difficult to assess because she has a hard time turning her neck from side to side.. Lungs: Mild scattered crackles  but no obvious rales.. Breathing is  unlabored. Heart: RRR with normal S1 S2. No murmurs, rubs, or gallops appreciated. Abdomen: Soft, non-tender, non-distended with normoactive bowel sounds. No hepatomegaly. No rebound/guarding. No obvious abdominal masses. Extremities: No edema.  Bilateral ankles have pressure wound dressings in place. Neuro: Alert and oriented X 3. No facial asymmetry.  Cannot move the legs.  Neck very stiff. Psych:  Responds to questions appropriately with a somewhat blunted affect.  EKG:  The EKG was personally reviewed and demonstrates Sinus tach, LVH, baseline wander, nonspecific STT changes. Appears similar to prior.  Relevant CV Studies: 2d echo 09/16/18 IMPRESSIONS  1. The left ventricle has low normal systolic function, with an ejection fraction of 50-55%. The cavity size was normal. There is mildly increased left ventricular wall thickness. Left ventricular diastolic Doppler parameters are consistent with  impaired relaxation. No evidence of left ventricular regional wall motion abnormalities.  2. The right ventricle has normal systolic function. The cavity was normal. There is no increase in right ventricular wall thickness.  3. The aortic valve was not well visualized. Mild thickening of the aortic valve. Mild calcification of the aortic valve. Aortic valve regurgitation is trivial by color flow Doppler.  Laboratory Data:  Chemistry Recent Labs  Lab 09/20/18 0251 09/21/18 0304 09/22/18 0342  NA 141 141 141  K 3.5 3.5 3.8  CL 103 104 105  CO2 25 22 23   GLUCOSE 203* 182* 211*  BUN 15 17 17   CREATININE 0.77 0.71 0.78  CALCIUM 9.3 9.1 9.3  GFRNONAA >60 >60 >60  GFRAA >60 >60 >60  ANIONGAP 13 15 13     Recent Labs  Lab 09/15/18 1153 09/19/18 0421  PROT 7.3 9.2*  ALBUMIN 3.3* 3.4*  AST 51* 42*  ALT 44 53*  ALKPHOS 59 73  BILITOT 0.9 1.0   Hematology Recent Labs  Lab 09/18/18 1903 09/19/18 0421 09/21/18 0304  WBC 10.1 7.9 10.5  RBC 4.61 5.02 4.99  HGB 14.1 15.3* 15.1*  HCT  42.8 46.9* 46.8*  MCV 92.8 93.4 93.8  MCH 30.6 30.5 30.3  MCHC 32.9 32.6 32.3  RDW 13.4 13.2 13.4  PLT 216 256 339   Cardiac Enzymes Recent Labs  Lab 09/15/18 1153  TROPONINI <0.03   No results for input(s): TROPIPOC in the last 168 hours.  BNP Recent Labs  Lab 09/15/18 1153 09/17/18 1445  BNP 76.7 178.8*    DDimer No results for input(s): DDIMER in the last 168 hours.  Radiology/Studies:  Dg Chest 2 View  Result Date: 09/20/2018 CLINICAL DATA:  Shortness of breath EXAM: CHEST - 2 VIEW COMPARISON:  Same day prior examination FINDINGS: Slight interval increase in diffuse interstitial pulmonary opacity, perhaps reflecting worsening edema. There is no clear right basilar pneumothorax; an unusual, oblique linear opacity is again noted over the mid right hemithorax raising the possibility of a right basilar pneumothorax. Lateral view is not revealing. Disc degenerative disease and ankylosis of the thoracic spine. IMPRESSION: 1. Slight interval increase in diffuse interstitial pulmonary opacity, perhaps reflecting worsening edema. 2. There is no clear right basilar pneumothorax; an unusual, oblique linear opacity is again noted over the mid right hemithorax raising the possibility of a right basilar pneumothorax. Lateral view is not revealing. If there is high clinical concern for pneumothorax, CT could be used for definitive evaluation. Electronically Signed   By: Eddie Candle M.D.   On: 09/20/2018 13:27   Ct Chest Wo Contrast  Result Date: 09/20/2018 CLINICAL DATA:  Right basilar  pneumothorax on prior chest x-ray. EXAM: CT CHEST WITHOUT CONTRAST TECHNIQUE: Multidetector CT imaging of the chest was performed following the standard protocol without IV contrast. COMPARISON:  Chest x-rays earlier today FINDINGS: Cardiovascular: Heart is normal size. Scattered aortic calcifications. No evidence of aortic aneurysm. Mediastinum/Nodes: Calcified left hilar and mediastinal lymph nodes compatible with  old granulomatous disease. No mediastinal, hilar, or axillary adenopathy. Lungs/Pleura: Calcified granulomas in the right upper lobe and superior segment of the left lower lobe. Extensive ground-glass airspace opacities in both upper lobes and superior segments of the lower lobes. Ground-glass opacities scattered in both lower lobes and right middle lobe. No effusions. No pneumothorax as questioned on prior x-ray. Upper Abdomen: Calcifications in the spleen compatible with old granulomatous disease. Diffuse fatty infiltration of the liver. Musculoskeletal: Chest wall soft tissues are unremarkable. No acute bony abnormality. IMPRESSION: No pneumothorax as questioned on prior chest x-rays. Old granulomatous disease. Ground-glass opacities most confluent in the upper lobes, also noted in the lower lobes and right middle lobe. Given the normal heart size, this is concerning for infection, less likely edema. Fatty infiltration of the liver. Aortic Atherosclerosis (ICD10-I70.0). Electronically Signed   By: Rolm Baptise M.D.   On: 09/20/2018 21:42   Dg Chest Port 1 View  Result Date: 09/20/2018 CLINICAL DATA:  Pulmonary edema EXAM: PORTABLE CHEST 1 VIEW COMPARISON:  09/17/2018 FINDINGS: Diffuse bilateral interstitial thickening. Subtle lucency at the right lung base which may be artifactual versus a basilar pneumothorax. Stable cardiomediastinal silhouette. No aggressive osseous lesion. IMPRESSION: 1. Subtle lucency at the right lung base which may be artifactual versus a basilar pneumothorax. Recommend repeat in 24 hours or a PA and lateral x-ray. Electronically Signed   By: Kathreen Devoid   On: 09/20/2018 10:08    Assessment and Plan:   1. Sepsis due to UTI with Klebsiella bacteremia - appears to be recurrent issue, completed abx per ID/IM recommendations. Also with ischial osteomyelitis.  2. Acute hypoxic/hypercapneic respiratory failure - has diuresed with IV Lasix for several days yet remains persistently  hypoxic. BNP on 6/12 seemed out of proportion for degree of hypoxia so likely multifactorial at onset. She is responding with good output with diuretics but would be concerned for an additional process at play here if her hypoxia does not continue to improve with diuretics. She is now below prior outpatient weights (but with no interim values to compare to). CT on 6/15 raised question of ground glass opacities with potential for infection, less likely edema. Consider repeat SARS-CoV2 testing. PE can also be considered, although would not have expected pulse ox to gradually improve if this were the case.  3. Acute on ?chronic diastolic CHF - CHF was never outlined in chart previously but on maintenance dose of Lasix at home with evidence of diastolic dysfunction on echocardiogram. Has been diuresing with stable renal function. Needs better BP control. Plan to review in full with MD.  4. AKI - resolved.  5. HTN - continue present regimen but anticipate adding additional agent.   6. Elevated LFTs - per primary team.   Signed, Charlie Pitter, PA-C  - chart prepped remotely to limit exposure during pandemic given concerning diagnostic findings. Reviewed with MD who will formally examine patient. Additional comments to follow. 09/22/2018 11:13 AM  ATTENDING ATTESTATION  I have seen, examined and evaluated the patient this PM along with Sharrell Ku, PA.  After reviewing all the available data and chart, we discussed the patients laboratory, study & physical findings  as well as symptoms in detail. I agree with her findings, examination as well as impression recommendations as per our discussion.    Attending adjustments noted in italics.   Difficult situation to come into.  The patient has been aggressively diuresed over the last several days with a diagnosis of acute on chronic diastolic heart failure.  She has no documented history of heart failure, nor does she remember being told she had any heart  failure.  She has diastolic dysfunction which is not unexpected and 68 year old with hypertension.  She has diuresed almost 8-1/2 L since admission which goes along with a weight drop.  Unfortunately we do not have a dry weight for her.  As of yet, her BUN and creatinine are still relatively stable, indicating that she probably still has some more volume removal.  She seems to retain fluid volume in her abdomen and not the legs.  Although she does seem to be volume overloaded, I suspect that there is another underlying etiology for her hypoxia.  We did a CT scan suggested none pulmonary edema etiology. Given the current concerns for COVID-19, I think is not unreasonable to reevaluate as there has been document improvement previously negative testing turning positive on recheck.  Will defer to hospitalist service but has much more experience in this field than cardiology.  I do agree with adding ARB which have done today --blood pressure not adequately controlled.  --  She is already on high-dose beta-blocker which is her home medication.  She is also taking Procardia at home which is being continued..  For now, not unreasonable to consider continued IV diuretic for least another day and reevaluate BUN/creatinine.  We will check on her again tomorrow.    Glenetta Hew, M.D., M.S. Interventional Cardiologist   Pager # [phone] Phone # [phone] [address] [place], [place] [address]   For questions or updates, please contact [place] Please consult [email or web address] for contact info under Cardiology/STEMI.

## 2018-09-22 NOTE — Evaluation (Signed)
Occupational Therapy Evaluation Patient Details Name: [name] MRN: [id number] DOB: 28-Aug-1950 Today's Date: 09/22/2018    History of Present Illness 68 y.o. female presenting to ED with fever, fatigue, lethargy, and hypotension. She has a suprapubic catheter due to neurogenic bladder. UA appears to be positive for a UTI. COVID-19 negative. Respiratory distress 6/11 and placed on BiPAP. PMH including DM II, Anemia, peripheral neuropathy, GERD, Hypertension, hypothyroidism, Paraplegia, Sacral decubitus ulcer, hyperlipidemia, and kidney stones.   Clinical Impression    PTA, pt was living with her husband and required Total A for bathing, dressing, and toileting at bed level; husband assisting with pivots to w/c for doctor's apt. Pt currently requiring Total A for bathing, dressing, toileting, and bed mobility. Presenting near baseline functional for ADLs. Recommend dc home with HHOT to optimizing on techniques for safety during ADLs and functional transfer to decrease burden of care. All acute OT needs met and will sign off.     Follow Up Recommendations  Home health OT;Supervision/Assistance - 24 hour    Equipment Recommendations  None recommended by OT    Recommendations for Other Services PT consult     Precautions / Restrictions Precautions Precautions: Fall;Other (comment)(Paraplegia) Restrictions Weight Bearing Restrictions: No      Mobility Bed Mobility Overal bed mobility: Needs Assistance Bed Mobility: Supine to Sit;Sit to Supine     Supine to sit: Total assist Sit to supine: Total assist   General bed mobility comments: Total A +2 for bed mobility and sitting at EOB. Pt able to reach for bed rails and assist in pulling towards side of bed  Transfers                      Balance Overall balance assessment: Needs assistance Sitting-balance support: Bilateral upper extremity supported;Feet supported Sitting balance-Leahy Scale: Zero Sitting balance -  Comments: Requiring Max A for sitting at EOB                                   ADL either performed or assessed with clinical judgement   ADL Overall ADL's : Needs assistance/impaired                                       General ADL Comments: Total A for bathing, dressing, and toileting.      Vision         Perception     Praxis      Pertinent Vitals/Pain Pain Assessment: Faces Faces Pain Scale: Hurts even more Pain Location: BLEs and burning in hands Pain Descriptors / Indicators: Burning;Constant;Discomfort;Grimacing Pain Intervention(s): Monitored during session;Limited activity within patient's tolerance;Repositioned     Hand Dominance     Extremity/Trunk Assessment Upper Extremity Assessment Upper Extremity Assessment: Generalized weakness   Lower Extremity Assessment Lower Extremity Assessment: Defer to PT evaluation   Cervical / Trunk Assessment Cervical / Trunk Assessment: Other exceptions Cervical / Trunk Exceptions: cervical spine injury   Communication Communication Communication: No difficulties   Cognition Arousal/Alertness: Awake/alert Behavior During Therapy: WFL for tasks assessed/performed Overall Cognitive Status: Within Functional Limits for tasks assessed                                     General Comments  SPo2 94% on 2L. BP supine 149/83 and EOB 137/91    Exercises     Shoulder Instructions      Home Living Family/patient expects to be discharged to:: Private residence Living Arrangements: Spouse/significant other Available Help at Discharge: Family;Available 24 hours/day Type of Home: House Home Access: Stairs to enter [employer] of Steps: 2   Home Layout: One level     Bathroom Shower/Tub: Other (comment)(Sponge baths in bed)   Bathroom Toilet: Standard     Home Equipment: Wheelchair - manual;Hospital bed          Prior Functioning/Environment Level of  Independence: Needs assistance  Gait / Transfers Assistance Needed: Total A and husband assist with stand pivot to w/c for doctor apt ADL's / Homemaking Assistance Needed: Total A for ADLs at bed level from husband, daughter, and aide (three days a week).             OT Problem List: Decreased strength;Decreased range of motion;Decreased activity tolerance;Impaired balance (sitting and/or standing);Decreased coordination;Impaired UE functional use;Pain;Obesity;Impaired sensation;Impaired tone      OT Treatment/Interventions:      OT Goals(Current goals can be found in the care plan section) Acute Rehab OT Goals Patient Stated Goal: "Go home" OT Goal Formulation: All assessment and education complete, DC therapy  OT Frequency:     Barriers to D/C:            Co-evaluation              AM-PAC OT "6 Clicks" Daily Activity     Outcome Measure Help from another person eating meals?: A Little Help from another person taking care of personal grooming?: A Little Help from another person toileting, which includes using toliet, bedpan, or urinal?: Total Help from another person bathing (including washing, rinsing, drying)?: Total Help from another person to put on and taking off regular upper body clothing?: Total Help from another person to put on and taking off regular lower body clothing?: Total 6 Click Score: 10   End of Session Nurse Communication: Mobility status;Other (comment)(Thirsty)  Activity Tolerance: Patient tolerated treatment well;Patient limited by fatigue Patient left: in bed;with call bell/phone within reach  OT Visit Diagnosis: Unsteadiness on feet (R26.81);Other abnormalities of gait and mobility (R26.89);Muscle weakness (generalized) (M62.81);Pain Pain - Right/Left: (Bilateral) Pain - part of body: Hand;Arm;Leg                Time: 1610-9604 OT Time Calculation (min): 29 min Charges:  OT General Charges $OT Visit: 1 Visit OT Evaluation $OT Eval  Moderate Complexity: [place], OTR/L Acute Rehab Pager: [phone] Office: [place] 09/22/2018, 5:19 PM

## 2018-09-22 NOTE — Progress Notes (Signed)
Patient [name], female   DOB: 09-15-1950, 68 y.o.   MRN: [id number]  PROGRESS NOTE    [name]  MBW:[id number] DOB: 11/17/1950 DOA: 09/15/2018 PCP: [name], MD   Brief Narrative:  68 year old female with history of diabetes mellitus type 2, anemia, peripheral neuropathy, chronic suprapubic catheter due to neurogenic bladder, chronic pain, GERD, hypertension, hypothyroidism, paraplegia, sacral decubitus, hyperlipidemia, nephrolithiasis presented on 09/15/2018 with lethargy and fever.  She was found to have UTI.  CT abdomen pelvis demonstrated likely fecal impaction along with likely osteomyelitis involving left ischium related to chronic sacral decubitus ulcer.  During the hospitalization, patient was found to have Klebsiella bacteremia and has completed antibiotic therapy as per ID.  She also developed acute hypoxic/hypercapnic respiratory failure requiring high flow nasal cannula along with BiPAP.  PCCM was consulted.  All narcotics were discontinued.  She was started on IV diuretics.    Assessment & Plan:   Principal Problem:   Bacteremia due to Klebsiella pneumoniae Active Problems:   Diabetes type 2, uncontrolled ([place])   Sepsis due to urinary tract infection (HCC)   Left perineal ischial pressure ulcer   Acute respiratory failure with hypoxia and hypercapnia (HCC)  Sepsis secondary to UTI: Present on admission Klebsiella and E. coli UTI Klebsiella bacteremia -Has completed 1 week of Rocephin as per ID recommendations.  Antibiotics discontinued on 09/21/2018. -Sepsis has resolved  Acute hypoxic/hypercapnic respiratory failure -Patient does not use oxygen at home -Most likely secondary to hypercapnia from narcotic use along with CHF exacerbation -Improved with high flow nasal cannula oxygen along with BiPAP -PCCM was consulted: Recommended BiPAP and IV diuretics -Currently still requiring 5 L oxygen via nasal cannula.  Wean off as able.  Incentive spirometry.  Might  need pulmonary evaluation as an outpatient.  CT of the chest on 09/20/2018 had shown groundglass opacities.  There was no evidence of pneumothorax.  If respiratory status does not improve, will have to involve pulmonary again. -Continue diuretics as below  Acute on chronic diastolic CHF -Echo showed EF of 50 to 55%. -Strict input and output.  Daily weights.  Fluid restriction.  Negative balance of 8423.9 cc since admission.  Currently on Lasix 60 mg IV every 12 hours.  Continue irbesartan and metoprolol.  I have requested cardiology evaluation.  Hypertension -Monitor blood pressure.  Continue Lasix, metoprolol, nifedipine and irbesartan  Diabetes mellitus type 2 with hyperglycemia -Increase Lantus to 20 units nightly.  Continue CBGs with SSI  Sacral decubitus ulcer -Wound care as per wound care recommendations.  No evidence of osteomyelitis as per ID.  Outpatient follow-up with wound care  Chronic paraplegia Neurogenic bladder with history of suprapubic catheter Constipation Chronic pain -Patient is paraplegic.  She has been on chronic pain medication.  I spoke with Dr. Greta Doom on phone today who is her outpatient pain management specialist.  She told me that patient has been on morphine sulfate extended release 30 mg twice a day and Percocet 10-325 every 6 hours as needed for many years.  Dr. Greta Doom stated that patient might benefit from inpatient rehabilitation for spinal cord rehab.  I will place consult for CIR. -Currently pain medications have been discontinued because of patient's respiratory status -Use laxatives  -PT/OT eval -Palliative care consult for goals of care discussion -Patient/family has refused SNF  DVT prophylaxis: Heparin Code Status:  Full Family Communication: Spoke with daughter/Ursula on phone on 09/22/2018 Disposition Plan: Home in 1 to 2 days once clinically improved.  Consultants: Cardiology/PCCM Procedures:  Echo IMPRESSIONS    1. The left ventricle has  low normal systolic function, with an ejection fraction of 50-55%. The cavity size was normal. There is mildly increased left ventricular wall thickness. Left ventricular diastolic Doppler parameters are consistent with  impaired relaxation. No evidence of left ventricular regional wall motion abnormalities.  2. The right ventricle has normal systolic function. The cavity was normal. There is no increase in right ventricular wall thickness.  3. The aortic valve was not well visualized. Mild thickening of the aortic valve. Mild calcification of the aortic valve. Aortic valve regurgitation is trivial by color flow Doppler.  Antimicrobials:  Anti-infectives (From admission, onward)   Start     Dose/Rate Route Frequency Ordered Stop   09/17/18 1500  vancomycin (VANCOCIN) 1,250 mg in sodium chloride 0.9 % 250 mL IVPB  Status:  Discontinued     1,250 mg 166.7 mL/hr over 90 Minutes Intravenous Every 48 hours 09/15/18 1412 09/16/18 1321   09/16/18 2000  cefTRIAXone (ROCEPHIN) 2 g in sodium chloride 0.9 % 100 mL IVPB     2 g 200 mL/hr over 30 Minutes Intravenous Every 24 hours 09/16/18 1105 09/21/18 2032   09/16/18 0200  ceFEPIme (MAXIPIME) 2 g in sodium chloride 0.9 % 100 mL IVPB  Status:  Discontinued     2 g 200 mL/hr over 30 Minutes Intravenous Every 12 hours 09/15/18 1412 09/16/18 1105   09/15/18 1700  ceFEPIme (MAXIPIME) 2 g in sodium chloride 0.9 % 100 mL IVPB  Status:  Discontinued     2 g 200 mL/hr over 30 Minutes Intravenous  Once 09/15/18 1649 09/15/18 1700   09/15/18 1400  vancomycin (VANCOCIN) 1,500 mg in sodium chloride 0.9 % 500 mL IVPB     1,500 mg 250 mL/hr over 120 Minutes Intravenous  Once 09/15/18 1346 09/15/18 1649   09/15/18 1345  ceFEPIme (MAXIPIME) 2 g in sodium chloride 0.9 % 100 mL IVPB     2 g 200 mL/hr over 30 Minutes Intravenous  Once 09/15/18 1343 09/15/18 1458   09/15/18 1345  metroNIDAZOLE (FLAGYL) IVPB 500 mg     500 mg 100 mL/hr over 60 Minutes Intravenous  Once  09/15/18 1343 09/15/18 1600   09/15/18 1345  vancomycin (VANCOCIN) IVPB 1000 mg/200 mL premix  Status:  Discontinued     1,000 mg 200 mL/hr over 60 Minutes Intravenous  Once 09/15/18 1343 09/15/18 1346       Subjective: Patient seen and examined at bedside.  She wants to go home.  No overnight fever, nausea or vomiting.  Still short of breath with exertion.  Objective: Vitals:   09/21/18 2240 09/22/18 0459 09/22/18 0523 09/22/18 0925  BP: (!) 160/78  (!) 159/77 (!) 150/74  Pulse: 97  83 99  Resp: 15  18 20   Temp:  97.9 F (36.6 C)  97.8 F (36.6 C)  TempSrc:  Oral  Oral  SpO2: 98%  95% 97%  Weight:      Height:        Intake/Output Summary (Last 24 hours) at 09/22/2018 1321 Last data filed at 09/22/2018 0529 Gross per 24 hour  Intake 240 ml  Output 800 ml  Net -560 ml   Filed Weights   09/15/18 1538 09/15/18 2237 09/21/18 0500  Weight: 90.7 kg 100.1 kg 89.4 kg    Examination:  General exam: Appears calm and comfortable.  Looks older than stated age.  Looks chronically ill. Respiratory system: Bilateral decreased breath sounds at bases with basilar  crackles Cardiovascular system: S1 & S2 heard, Rate controlled Gastrointestinal system: Abdomen is distended, soft and nontender. Normal bowel sounds heard. Extremities: No cyanosis, clubbing; trace edema.   Data Reviewed: I have personally reviewed following labs and imaging studies  CBC: Recent Labs  Lab 09/15/18 1702 09/15/18 1739 09/17/18 0326 09/18/18 1903 09/19/18 0421 09/21/18 0304  WBC 11.4*  --  10.5 10.1 7.9 10.5  HGB 12.5 12.2 14.0 14.1 15.3* 15.1*  HCT 39.6 36.0 43.1 42.8 46.9* 46.8*  MCV 98.0  --  94.7 92.8 93.4 93.8  PLT 156  --  191 216 256 517   Basic Metabolic Panel: Recent Labs  Lab 09/15/18 1702  09/17/18 0326 09/18/18 1351 09/19/18 0421 09/20/18 0251 09/21/18 0304 09/22/18 0342  NA  --    < > 135 138 138 141 141 141  K  --    < > 4.6 4.6 3.6 3.5 3.5 3.8  CL  --    < > 101 104 99  103 104 105  CO2  --    < > 24 19* 24 25 22 23   GLUCOSE  --    < > 322* 207* 234* 203* 182* 211*  BUN  --    < > 16 14 12 15 17 17   CREATININE 1.23*  --  1.13* 0.87 0.82 0.77 0.71 0.78  CALCIUM  --    < > 8.6* 8.5* 9.3 9.3 9.1 9.3  MG 1.4*  --  1.9  --   --  2.2  --   --   PHOS 2.6  --   --   --   --   --   --   --    < > = values in this interval not displayed.   GFR: Estimated Creatinine Clearance: 68 mL/min (by C-G formula based on SCr of 0.78 mg/dL). Liver Function Tests: Recent Labs  Lab 09/19/18 0421  AST 42*  ALT 53*  ALKPHOS 73  BILITOT 1.0  PROT 9.2*  ALBUMIN 3.4*   No results for input(s): LIPASE, AMYLASE in the last 168 hours. No results for input(s): AMMONIA in the last 168 hours. Coagulation Profile: Recent Labs  Lab 09/16/18 0426  INR 1.2   Cardiac Enzymes: No results for input(s): CKTOTAL, CKMB, CKMBINDEX, TROPONINI in the last 168 hours. BNP (last 3 results) No results for input(s): PROBNP in the last 8760 hours. HbA1C: No results for input(s): HGBA1C in the last 72 hours. CBG: Recent Labs  Lab 09/21/18 1157 09/21/18 1645 09/21/18 2141 09/22/18 0738 09/22/18 1137  GLUCAP 196* 147* 172* 201* 217*   Lipid Profile: No results for input(s): CHOL, HDL, LDLCALC, TRIG, CHOLHDL, LDLDIRECT in the last 72 hours. Thyroid Function Tests: No results for input(s): TSH, T4TOTAL, FREET4, T3FREE, THYROIDAB in the last 72 hours. Anemia Panel: No results for input(s): VITAMINB12, FOLATE, FERRITIN, TIBC, IRON, RETICCTPCT in the last 72 hours. Sepsis Labs: Recent Labs  Lab 09/15/18 1702 09/15/18 2241 09/16/18 0426 09/17/18 1454  PROCALCITON  --   --  18.54  --   LATICACIDVEN 2.9* 2.1*  --  3.1*    Recent Results (from the past 240 hour(s))  Urine culture     Status: Abnormal   Collection Time: 09/15/18 11:53 AM   Specimen: Urine, Random  Result Value Ref Range Status   Specimen Description URINE, RANDOM  Final   Special Requests   Final    NONE  Performed at [facility], [place] [address] [place], [place] [address]  Culture (A)  Final    >=100,000 COLONIES/mL KLEBSIELLA PNEUMONIAE >=100,000 COLONIES/mL ESCHERICHIA COLI    Report Status 09/18/2018 FINAL  Final   Organism ID, Bacteria KLEBSIELLA PNEUMONIAE (A)  Final   Organism ID, Bacteria ESCHERICHIA COLI (A)  Final      Susceptibility   Escherichia coli - MIC*    AMPICILLIN <=2 SENSITIVE Sensitive     CEFAZOLIN <=4 SENSITIVE Sensitive     CEFTRIAXONE <=1 SENSITIVE Sensitive     CIPROFLOXACIN <=0.25 SENSITIVE Sensitive     GENTAMICIN <=1 SENSITIVE Sensitive     IMIPENEM <=0.25 SENSITIVE Sensitive     NITROFURANTOIN <=16 SENSITIVE Sensitive     TRIMETH/SULFA <=20 SENSITIVE Sensitive     AMPICILLIN/SULBACTAM <=2 SENSITIVE Sensitive     PIP/TAZO <=4 SENSITIVE Sensitive     Extended ESBL NEGATIVE Sensitive     * >=100,000 COLONIES/mL ESCHERICHIA COLI   Klebsiella pneumoniae - MIC*    AMPICILLIN >=32 RESISTANT Resistant     CEFAZOLIN <=4 SENSITIVE Sensitive     CEFTRIAXONE <=1 SENSITIVE Sensitive     CIPROFLOXACIN <=0.25 SENSITIVE Sensitive     GENTAMICIN <=1 SENSITIVE Sensitive     IMIPENEM <=0.25 SENSITIVE Sensitive     NITROFURANTOIN 128 RESISTANT Resistant     TRIMETH/SULFA <=20 SENSITIVE Sensitive     AMPICILLIN/SULBACTAM >=32 RESISTANT Resistant     PIP/TAZO <=4 SENSITIVE Sensitive     Extended ESBL NEGATIVE Sensitive     * >=100,000 COLONIES/mL KLEBSIELLA PNEUMONIAE  SARS Coronavirus 2 (CEPHEID- Performed in [facility] hospital lab), Hosp Order     Status: None   Collection Time: 09/15/18 11:54 AM   Specimen: Nasopharyngeal Swab  Result Value Ref Range Status   SARS Coronavirus 2 NEGATIVE NEGATIVE Final    Comment: (NOTE) If result is NEGATIVE SARS-CoV-2 target nucleic acids are NOT DETECTED. The SARS-CoV-2 RNA is generally detectable in upper and lower  respiratory specimens during the acute phase of infection. The lowest  concentration of SARS-CoV-2  viral copies this assay can detect is 250  copies / mL. A negative result does not preclude SARS-CoV-2 infection  and should not be used as the sole basis for treatment or other  patient management decisions.  A negative result may occur with  improper specimen collection / handling, submission of specimen other  than nasopharyngeal swab, presence of viral mutation(s) within the  areas targeted by this assay, and inadequate number of viral copies  (<250 copies / mL). A negative result must be combined with clinical  observations, patient history, and epidemiological information. If result is POSITIVE SARS-CoV-2 target nucleic acids are DETECTED. The SARS-CoV-2 RNA is generally detectable in upper and lower  respiratory specimens dur ing the acute phase of infection.  Positive  results are indicative of active infection with SARS-CoV-2.  Clinical  correlation with patient history and other diagnostic information is  necessary to determine patient infection status.  Positive results do  not rule out bacterial infection or co-infection with other viruses. If result is PRESUMPTIVE POSTIVE SARS-CoV-2 nucleic acids MAY BE PRESENT.   A presumptive positive result was obtained on the submitted specimen  and confirmed on repeat testing.  While 2019 novel coronavirus  (SARS-CoV-2) nucleic acids may be present in the submitted sample  additional confirmatory testing may be necessary for epidemiological  and / or clinical management purposes  to differentiate between  SARS-CoV-2 and other Sarbecovirus currently known to infect humans.  If clinically indicated additional testing with an alternate test  methodology (  [id number]) is advised. The SARS-CoV-2 RNA is generally  detectable in upper and lower respiratory sp ecimens during the acute  phase of infection. The expected result is Negative. Fact Sheet for Patients:  StrictlyIdeas.no Fact Sheet for Healthcare Providers:  BankingDealers.co.za This test is not yet approved or cleared by the [place] FDA and has been authorized for detection and/or diagnosis of SARS-CoV-2 by FDA under an Emergency Use Authorization (EUA).  This EUA will remain in effect (meaning this test can be used) for the duration of the COVID-19 declaration under Section 564(b)(1) of the Act, 21 U.S.C. section 360bbb-3(b)(1), unless the authorization is terminated or revoked sooner. Performed at [facility], [place] [address] [place], [place] [address]   Blood Culture (routine x 2)     Status: Abnormal   Collection Time: 09/15/18 11:58 AM   Specimen: BLOOD  Result Value Ref Range Status   Specimen Description BLOOD BLOOD RIGHT FOREARM  Final   Special Requests   Final    BOTTLES DRAWN AEROBIC AND ANAEROBIC Blood Culture adequate volume   Culture  Setup Time   Final    GRAM NEGATIVE RODS AEROBIC BOTTLE ONLY CRITICAL VALUE NOTED.  VALUE IS CONSISTENT WITH PREVIOUSLY REPORTED AND CALLED VALUE.    Culture (A)  Final    KLEBSIELLA PNEUMONIAE SUSCEPTIBILITIES PERFORMED ON PREVIOUS CULTURE WITHIN THE LAST 5 DAYS. Performed at [facility], [place] [address] [place], [place] [address]    Report Status 09/18/2018 FINAL  Final  Blood Culture (routine x 2)     Status: Abnormal   Collection Time: 09/15/18 12:36 PM   Specimen: BLOOD  Result Value Ref Range Status   Specimen Description BLOOD SITE NOT SPECIFIED  Final   Special Requests   Final    BOTTLES DRAWN AEROBIC AND ANAEROBIC Blood Culture results may not be optimal due to an inadequate volume of blood received in culture bottles   Culture  Setup Time   Final    IN BOTH AEROBIC AND ANAEROBIC BOTTLES Organism ID to follow GRAM NEGATIVE RODS CRITICAL RESULT CALLED TO, READ BACK BY AND VERIFIED WITH: MCCARTHY PHARMD AT 0800 ON [id number] BY SJW Performed at [facility], [place] [address] [place], [place] [address]    Culture KLEBSIELLA  PNEUMONIAE (A)  Final   Report Status 09/18/2018 FINAL  Final   Organism ID, Bacteria KLEBSIELLA PNEUMONIAE  Final      Susceptibility   Klebsiella pneumoniae - MIC*    AMPICILLIN >=32 RESISTANT Resistant     CEFAZOLIN <=4 SENSITIVE Sensitive     CEFEPIME <=1 SENSITIVE Sensitive     CEFTAZIDIME <=1 SENSITIVE Sensitive     CEFTRIAXONE <=1 SENSITIVE Sensitive     CIPROFLOXACIN <=0.25 SENSITIVE Sensitive     GENTAMICIN <=1 SENSITIVE Sensitive     IMIPENEM 0.5 SENSITIVE Sensitive     TRIMETH/SULFA <=20 SENSITIVE Sensitive     AMPICILLIN/SULBACTAM >=32 RESISTANT Resistant     PIP/TAZO 8 SENSITIVE Sensitive     Extended ESBL NEGATIVE Sensitive     * KLEBSIELLA PNEUMONIAE  Blood Culture ID Panel (Reflexed)     Status: Abnormal   Collection Time: 09/15/18 12:36 PM  Result Value Ref Range Status   Enterococcus species NOT DETECTED NOT DETECTED Final   Listeria monocytogenes NOT DETECTED NOT DETECTED Final   Staphylococcus species NOT DETECTED NOT DETECTED Final   Staphylococcus aureus (BCID) NOT DETECTED NOT DETECTED Final   Streptococcus species NOT DETECTED NOT DETECTED Final  Streptococcus agalactiae NOT DETECTED NOT DETECTED Final   Streptococcus pneumoniae NOT DETECTED NOT DETECTED Final   Streptococcus pyogenes NOT DETECTED NOT DETECTED Final   Acinetobacter baumannii NOT DETECTED NOT DETECTED Final   Enterobacteriaceae species DETECTED (A) NOT DETECTED Final    Comment: Enterobacteriaceae represent a large family of gram-negative bacteria, not a single organism. CRITICAL RESULT CALLED TO, READ BACK BY AND VERIFIED WITH: MCCARTHY PHARMD AT 0800 ON [id number] BY SJW    Enterobacter cloacae complex NOT DETECTED NOT DETECTED Final   Escherichia coli NOT DETECTED NOT DETECTED Final   Klebsiella oxytoca NOT DETECTED NOT DETECTED Final   Klebsiella pneumoniae DETECTED (A) NOT DETECTED Final    Comment: CRITICAL RESULT CALLED TO, READ BACK BY AND VERIFIED WITH: MCCARTHY PHARMD AT 0800 ON  [id number] BY SJW    Proteus species NOT DETECTED NOT DETECTED Final   Serratia marcescens NOT DETECTED NOT DETECTED Final   Carbapenem resistance NOT DETECTED NOT DETECTED Final   Haemophilus influenzae NOT DETECTED NOT DETECTED Final   Neisseria meningitidis NOT DETECTED NOT DETECTED Final   Pseudomonas aeruginosa NOT DETECTED NOT DETECTED Final   Candida albicans NOT DETECTED NOT DETECTED Final   Candida glabrata NOT DETECTED NOT DETECTED Final   Candida krusei NOT DETECTED NOT DETECTED Final   Candida parapsilosis NOT DETECTED NOT DETECTED Final   Candida tropicalis NOT DETECTED NOT DETECTED Final    Comment: Performed at [facility], [address] [place], [place] [address]  MRSA PCR Screening     Status: Abnormal   Collection Time: 09/16/18  3:37 AM   Specimen: Nasopharyngeal  Result Value Ref Range Status   MRSA by PCR POSITIVE (A) NEGATIVE Final    Comment:        The GeneXpert MRSA Assay (FDA approved for NASAL specimens only), is one component of a comprehensive MRSA colonization surveillance program. It is not intended to diagnose MRSA infection nor to guide or monitor treatment for MRSA infections. RESULT CALLED TO, READ BACK BY AND VERIFIED WITH: LOFTIN,Q RN 09/16/2018 AT 4403 SKEEN,P Performed at [facility], [place] [address], [place],  [address]          Radiology Studies: Ct Chest Wo Contrast  Result Date: 09/20/2018 CLINICAL DATA:  Right basilar pneumothorax on prior chest x-ray. EXAM: CT CHEST WITHOUT CONTRAST TECHNIQUE: Multidetector CT imaging of the chest was performed following the standard protocol without IV contrast. COMPARISON:  Chest x-rays earlier today FINDINGS: Cardiovascular: Heart is normal size. Scattered aortic calcifications. No evidence of aortic aneurysm. Mediastinum/Nodes: Calcified left hilar and mediastinal lymph nodes compatible with old granulomatous disease. No mediastinal, hilar, or axillary adenopathy.  Lungs/Pleura: Calcified granulomas in the right upper lobe and superior segment of the left lower lobe. Extensive ground-glass airspace opacities in both upper lobes and superior segments of the lower lobes. Ground-glass opacities scattered in both lower lobes and right middle lobe. No effusions. No pneumothorax as questioned on prior x-ray. Upper Abdomen: Calcifications in the spleen compatible with old granulomatous disease. Diffuse fatty infiltration of the liver. Musculoskeletal: Chest wall soft tissues are unremarkable. No acute bony abnormality. IMPRESSION: No pneumothorax as questioned on prior chest x-rays. Old granulomatous disease. Ground-glass opacities most confluent in the upper lobes, also noted in the lower lobes and right middle lobe. Given the normal heart size, this is concerning for infection, less likely edema. Fatty infiltration of the liver. Aortic Atherosclerosis (ICD10-I70.0). Electronically Signed   By: Rolm Baptise M.D.   On:  09/20/2018 21:42        Scheduled Meds: . chlorhexidine  15 mL Mouth Rinse BID  . DULoxetine  120 mg Oral Daily  . feeding supplement (NEPRO CARB STEADY)  237 mL Oral QHS  . ferrous sulfate  325 mg Oral QPC supper  . folic acid  1 mg Oral Daily  . furosemide  60 mg Intravenous Q12H  . heparin  5,000 Units Subcutaneous Q8H  . insulin aspart  0-15 Units Subcutaneous TID WC  . insulin glargine  18 Units Subcutaneous QHS  . irbesartan  75 mg Oral Daily  . levothyroxine  125 mcg Oral QAC breakfast  . mouth rinse  15 mL Mouth Rinse q12n4p  . metoprolol tartrate  100 mg Oral BID  . multivitamin with minerals  1 tablet Oral Daily  . NIFEdipine  30 mg Oral QPC supper  . polyethylene glycol  17 g Oral BID  . pregabalin  75 mg Oral BID  . senna  2 tablet Oral QHS  . ascorbic acid  500 mg Oral Daily  . zinc sulfate  220 mg Oral Daily   Continuous Infusions: . sodium chloride 10 mL/hr at 09/20/18 2035     LOS: 7 days        Aline August, MD  [facility] 09/22/2018, 1:21 PM

## 2018-09-22 NOTE — Consult Note (Addendum)
NAME:  [name], MRN:  [id number], DOB:  [birth year], LOS: 7 ADMISSION DATE:  09/15/2018, CONSULTATION DATE: 6/12 REFERRING MD: 6/10, CHIEF COMPLAINT: Acute hypoxic respiratory failure  Brief History   Paraplegic 68 year old female with known history of sacral osteomyelitis and more recently extension into left ischial osteo-admitted 6/10 with sepsis due to Klebsiella bacteremia from what appeared to be urinary tract source with chronic suprapubic catheter.  Pulmonary asked to see on 6/12 for worsening respiratory distress and hypoxic respiratory failure  History of present illness   68 year old female with history as mentioned below admitted with working diagnosis of sepsis in the setting of Klebsiella bacteremia felt likely from urinary tract source and chronic suprapubic catheter.  She also has sacral osteomyelitis with recent spread to the ischial perineum.  She is been seen by infectious disease who felt source most likely urinary tract at this point and recommended outpatient follow-up in regards to the osteomyelitis.  Her course was complicated by what appears to be severe constipation and ileus, worsening abdominal pain, and both hypercarbic and hypoxic respiratory failure.    On eval 5/12 she was found to have B interstitial infiltrates consistent with pulm edema, has been aggressively diuresed (-8.4L).  resp staus has improved but she continues to have O2 requirement. CXR has improved but not normalized. This is confirmed on CT chest done 6/15 to evaluate for a possible R basilar PTX (not present).  Pulmonary consulted to reevaluate  Past Medical History  Poorly controlled diabetes, left perineal ischial pressure ulcer, sacral decubitus, suprapubic catheter due to neurogenic bladder, peripheral neuropathy, hypertension, hypothyroidism, paraplegia  Significant Hospital Events   6/10: Admitted with fever lethargy and fatigue.  CT imaging demonstrated fecal impaction also osteomyelitis  involving left ischium and sacral decubitus ulcer.  Admitted with working diagnosis of sepsis, culture sent, started on antibiotics.   6/11: Seen by infectious disease, preliminary blood cultures showing Klebsiella pneumonia bacteremia with Klebsiella also growing in urine culture.  They felt suprapubic catheter likely the source.  Noted to be more short of breath, pulse oximetry 88 to 89% on 3 L.  She was desaturate when oxygen was removed.  She was placed on high flow oxygen, progressively became less responsive, arterial blood gas showed pH of 7.01, PCO2 of 93, PO2 of 74.  Placed on non-invasive positive pressure ventilation and given IV Lasix, about 2 hours after She was much more awake and interactive, PCO2 had dropped down to 47 PO2 is improved.  She had stabilized by the early hours of 6/12. 6/12: Still reporting shortness of breath, having multiple complaints primarily regarding comfort and abdominal pain.  She did get an enema earlier today reporting some results but not completely relieved.  Pulse oximetry in the low 80s on 12 L nasal cannula pulmonary asked to reevaluate.  Follow-up arterial blood gas showing adequate ventilation with pH of 7.44 and PCO2 of 41, she was more hypoxic with PO2 of 45, bicarbonate 27.5 pulse oximetry 82% we placed her back on BiPAP  Procedures:    Significant Diagnostic Tests:  6/10 CT abdomen pelvis:BILATERAL nonobstructing renal calculi.Small focus of gas within collecting system mid inferior pole RIGHT kidney of uncertain etiology and significance. Suspected small cyst at upper pole LEFT kidney. Markedly increased stool in rectum. Bone destruction/loss of cortical margination at inferior aspect of the LEFT ischium suspicious for osteomyelitis. Fatty infiltration of liver. Questionable cholelithiasis.  6/15 CT chest >> no PTX, persistent B upper lobe predominant GGI   Micro Data:  Blood culture 6/10 Klebsiella pneumoniae Urine culture 6/10 Klebsiella  pneumoniae pansensitive also growing E coli  Antimicrobials:  Vancomycin 6/10 Through 6/11 Cefepime 6/10 through 6/11 Ceftriaxone 6/11>>>  Interim history/subjective:  Patient was on 5 L/min this morning, currently on 2 L/min Comfortable.  She denies any aspiration symptoms  Objective   Blood pressure 135/68, pulse 91, temperature 98 F (36.7 C), temperature source Oral, resp. rate 19, height 5\' 2"  (1.575 m), weight 89.4 kg, SpO2 98 %.        Intake/Output Summary (Last 24 hours) at 09/22/2018 1542 Last data filed at 09/22/2018 0529 Gross per 24 hour  Intake 240 ml  Output 800 ml  Net -560 ml   Filed Weights   09/15/18 1538 09/15/18 2237 09/21/18 0500  Weight: 90.7 kg 100.1 kg 89.4 kg    Examination: General: Elderly woman, no distress on submental oxygen HENT: Cephalic, pupils equal Lungs: Clear anterior laterally, no wheezing Cardiovascular: Geter, no murmur Abdomen: Soft, nondistended, positive bowel sounds Extremities: Trace pretibial edema Neuro: Awake, alert, oriented, follows commands, able to move her bilateral upper extremities with some difficulty, cannot move her lower extremities GU: Suprapubic catheter in place  Resolved Hospital Problem list   Hypercarbic respiratory failure  Assessment & Plan:  Acute hypoxic respiratory failure.  Some component abdominal compliance, resp suppression earlier in the course, but suspect current O2 need due largely to her B pulmonary infiltrates. Multifactorial with component of dCHF, but also at significant risk for non-cardiogenic edema / acute lung injury due to her sepsis. Consider also other causes of pneumonitis including occult aspiration (she has no overt symptoms).   Plan Continue to hold all narcotics Agree with continued diuresis as she can tolerate.  Suspect that she is approaching euvolemia.  If this reflects a component of ALI then it will take some time for complete improvement Follow intermittent chest  x-rays Continue weaning oxygen, some success doing so today. Pulmonary hygiene Question consider modified barium swallow to rule out occult aspiration although she denies any symptoms.  Sepsis due to klebsiella  pneumoniae bacteremia w/ klebsiella PNA UTI and E coli UTI, in setting of chronic suprapubic catheter Plan/recommendation Appreciate ID input Ceftriaxone If we believe her ALI is due to sepsis may need to revisit whether there is definitive therapy for her osteomyelitis.  Suprapubic cath was exchanged.   Acute on chronic diastolic heart failure with pulmonary edema Plan Appreciate cardiology input. Suspect that she is approaching euvolemia. Agree with diuresis as she can tolerate, but the non-cardiogenic component of this process likely cannot be 'diuresed away". Need to treat underlying contributors especially sepsis.   Hypothyroidism Plan Continue replacement  Chronic decubitus ulcer including the sacrum and left ischium Plan Currently planning for  F/u in wound clinic, unless the osteomyelitis is deemed a possible contributor to her acute lung injury.    Best practice:  Diet: carb modified.  Pain/Anxiety/Delirium protocol (if indicated): Not indicated VAP protocol (if indicated): Not indicated DVT prophylaxis: Subcutaneous heparin GI prophylaxis: Not indicated Glucose control: Sliding scale insulin Mobility: Advance as tolerated Code Status: Full code Family Communication: Per primary Disposition:    Labs   CBC: Recent Labs  Lab 09/15/18 1702 09/15/18 1739 09/17/18 0326 09/18/18 1903 09/19/18 0421 09/21/18 0304  WBC 11.4*  --  10.5 10.1 7.9 10.5  HGB 12.5 12.2 14.0 14.1 15.3* 15.1*  HCT 39.6 36.0 43.1 42.8 46.9* 46.8*  MCV 98.0  --  94.7 92.8 93.4 93.8  PLT 156  --  191  216 256 517    Basic Metabolic Panel: Recent Labs  Lab 09/15/18 1702  09/17/18 0326 09/18/18 1351 09/19/18 0421 09/20/18 0251 09/21/18 0304 09/22/18 0342  NA  --    < > 135  138 138 141 141 141  K  --    < > 4.6 4.6 3.6 3.5 3.5 3.8  CL  --    < > 101 104 99 103 104 105  CO2  --    < > 24 19* 24 25 22 23   GLUCOSE  --    < > 322* 207* 234* 203* 182* 211*  BUN  --    < > 16 14 12 15 17 17   CREATININE 1.23*  --  1.13* 0.87 0.82 0.77 0.71 0.78  CALCIUM  --    < > 8.6* 8.5* 9.3 9.3 9.1 9.3  MG 1.4*  --  1.9  --   --  2.2  --   --   PHOS 2.6  --   --   --   --   --   --   --    < > = values in this interval not displayed.   GFR: Estimated Creatinine Clearance: 68 mL/min (by C-G formula based on SCr of 0.78 mg/dL). Recent Labs  Lab 09/15/18 1702 09/15/18 2241 09/16/18 0426 09/17/18 0326 09/17/18 1454 09/18/18 1903 09/19/18 0421 09/21/18 0304  PROCALCITON  --   --  18.54  --   --   --   --   --   WBC 11.4*  --   --  10.5  --  10.1 7.9 10.5  LATICACIDVEN 2.9* 2.1*  --   --  3.1*  --   --   --     Liver Function Tests: Recent Labs  Lab 09/19/18 0421  AST 42*  ALT 53*  ALKPHOS 73  BILITOT 1.0  PROT 9.2*  ALBUMIN 3.4*   No results for input(s): LIPASE, AMYLASE in the last 168 hours. No results for input(s): AMMONIA in the last 168 hours.  ABG    Component Value Date/Time   PHART 7.442 09/17/2018 1445   PCO2ART 41.1 09/17/2018 1445   PO2ART 45.5 (L) 09/17/2018 1445   HCO3 27.5 09/17/2018 1445   TCO2 28 09/15/2018 1739   ACIDBASEDEF 7.3 (H) 09/17/2018 0003   O2SAT 81.9 09/17/2018 1445     Coagulation Profile: Recent Labs  Lab 09/16/18 0426  INR 1.2    Cardiac Enzymes: No results for input(s): CKTOTAL, CKMB, CKMBINDEX, TROPONINI in the last 168 hours.  HbA1C: Hgb A1c MFr Bld  Date/Time Value Ref Range Status  09/15/2018 10:41 PM 10.0 (H) 4.8 - 5.6 % Final    Comment:    (NOTE) Pre diabetes:          5.7%-6.4% Diabetes:              >6.4% Glycemic control for   <7.0% adults with diabetes   04/18/2016 02:30 PM 11.7 (H) 4.8 - 5.6 % Final    Comment:    (NOTE)         Pre-diabetes: 5.7 - 6.4         Diabetes: >6.4          Glycemic control for adults with diabetes: <7.0     CBG: Recent Labs  Lab 09/21/18 1157 09/21/18 1645 09/21/18 2141 09/22/18 0738 09/22/18 1137  GLUCAP 196* 147* 172* 201* 217*    Review of Systems:   Review of Systems  Constitutional:  Positive for fever and malaise/fatigue.  HENT: Negative.   Eyes: Negative.   Respiratory: Positive for shortness of breath.   Cardiovascular: Negative.   Gastrointestinal: Positive for abdominal pain and constipation.  Genitourinary: Negative.   Musculoskeletal: Negative.   Skin: Negative.   Neurological: Negative.   Endo/Heme/Allergies: Negative.   Psychiatric/Behavioral: Negative.      Past Medical History  She,  has a past medical history of Anemia, Arthritis, Cancer of right breast ([place]) (2011), Cervical neuropathy, Diabetes mellitus without complication ([place]), Difficult intubation, GERD (gastroesophageal reflux disease), History of kidney stones, Hypercholesteremia, Hypertension, Hypothyroidism, Kidney stones, Neurogenic bladder, Osteomyelitis ([place]), Paraplegia ([place]), Peripheral neuropathy, PONV (postoperative nausea and vomiting), Sacral decubitus ulcer (10/2014), and Sepsis due to urinary tract infection ([place]).   Surgical History    Past Surgical History:  Procedure Laterality Date   ABDOMINAL HYSTERECTOMY  ~ [place] Right 2011   BREAST LUMPECTOMY Right 2011   CALDWELL LUC  ~ 2003   "benign tumor removed from up under gum" (04/13/2012)   DACROCYSTORHINOSTOMY  ~ 2000   "put stents in my tear ducts; both eyes" (04/13/2012)   EYE SURGERY  2004   STENTS TO BIL EYES   I&D EXTREMITY Bilateral 11/03/2014   Procedure: IRRIGATION AND DEBRIDEMENT BILATERAL HEEL;  Surgeon: Irene Limbo, MD;  Location: [facility];  Service: Plastics;  Laterality: Bilateral;   INCISION AND DRAINAGE OF WOUND Left 11/03/2014   Procedure: IRRIGATION AND DEBRIDEMENT SACRAL ULCER;  Surgeon: Irene Limbo, MD;  Location: [facility];   Service: Plastics;  Laterality: Left;   JOINT REPLACEMENT     POSTERIOR CERVICAL LAMINECTOMY  2006   SKIN SPLIT GRAFT Left 08/05/2016   Procedure: SURGICAL PREP  FOR GRAFTING APPLICATION ACELL TO LEFT ISCHIUM;  Surgeon: Irene Limbo, MD;  Location: [facility];  Service: Plastics;  Laterality: Left;   TONSILLECTOMY AND ADENOIDECTOMY  ~ 1954   TOTAL KNEE ARTHROPLASTY WITH REVISION COMPONENTS  04/13/2012   Procedure: TOTAL KNEE ARTHROPLASTY WITH REVISION COMPONENTS;  Surgeon: Hessie Dibble, MD;  Location: [facility];  Service: Orthopedics;  Laterality: Left;     Social History   reports that she quit smoking about 3 years ago. Her smoking use included cigarettes. She has a 25.00 pack-year smoking history. She has never used smokeless tobacco. She reports that she does not drink alcohol or use drugs.   Family History   Her family history includes Heart disease in her father.   Allergies Allergies  Allergen Reactions   Ivp Dye [Iodinated Diagnostic Agents] Other (See Comments)    "hot and sweaty and almost passed out"   Aspirin Hives   Sulfa Antibiotics Hives     Home Medications  Prior to Admission medications   Medication Sig Start Date End Date Taking? Authorizing Provider  ascorbic acid (VITAMIN C) 500 MG tablet Take 1 tablet (500 mg total) by mouth daily. 11/07/14  Yes Francesca Oman, DO  baclofen (LIORESAL) 20 MG tablet Take 20 mg by mouth every 6 (six) hours as needed for muscle spasms.    Yes [provider]  docusate sodium (COLACE) 100 MG capsule Take 100 mg by mouth 2 (two) times daily as needed (FOR CONSTIPATION).    Yes [provider]  DULoxetine (CYMBALTA) 60 MG capsule Take 120 mg by mouth daily.    Yes [provider]  ferrous sulfate 325 (65 FE) MG tablet Take 325 mg by mouth daily after supper.  Yes [provider]  folic acid (FOLVITE) 1 MG tablet Take 1 mg by mouth daily.    Yes [provider]  furosemide (LASIX) 40 MG  tablet Take 40 mg by mouth daily.    Yes [provider]  levothyroxine (SYNTHROID, LEVOTHROID) 125 MCG tablet Take 1 tablet (125 mcg total) by mouth daily before breakfast. Patient taking differently: Take 125 mcg by mouth daily.  11/07/14  Yes Francesca Oman, DO  metFORMIN (GLUCOPHAGE) 500 MG tablet Take 250 mg by mouth 2 (two) times daily with a meal.   Yes [provider]  metoprolol (LOPRESSOR) 100 MG tablet Take 100 mg by mouth 2 (two) times daily.  10/28/14  Yes [provider]  Morphine Sulfate ER 30 MG T12A Take 30 mg by mouth 2 (two) times daily.   Yes [provider]  Multiple Vitamin (MULTIVITAMIN WITH MINERALS) TABS tablet Take 1 tablet by mouth daily. 11/07/14  Yes Francesca Oman, DO  NIFEdipine (PROCARDIA XL/ADALAT-CC) 30 MG 24 hr tablet Take 30 mg by mouth daily after supper.    Yes [provider]  oxyCODONE-acetaminophen (PERCOCET) 10-325 MG tablet Take 1 tablet by mouth every 6 (six) hours as needed for pain.    Yes [provider]  pregabalin (LYRICA) 75 MG capsule Take 75 mg by mouth 2 (two) times daily.  08/23/18  Yes [provider]  senna (SENOKOT) 8.6 MG tablet Take 2 tablets by mouth at bedtime.   Yes [provider]  zinc sulfate 220 MG capsule Take 1 capsule (220 mg total) by mouth daily. 11/07/14  Yes Francesca Oman, DO  ciprofloxacin (CIPRO) 500 MG tablet Take 1 tablet (500 mg total) by mouth 2 (two) times daily. Patient not taking: Reported on 09/15/2018 08/15/17   Isla Pence, MD     Baltazar Apo, MD, PhD 09/22/2018, 6:11 PM [facility] [phone] or if no answer [phone]

## 2018-09-22 NOTE — Progress Notes (Signed)
Daughter [name] requested update. I confirmed with Ms. Agent this was OK. I spoke with [name] to give the update from cardiology standpoint (plan for diuresis + BP control). Angella Montas PA-C

## 2018-09-22 NOTE — Progress Notes (Signed)
Nutrition Follow-up  RD working remotely.  DOCUMENTATION CODES:   Morbid obesity  INTERVENTION:   -Continue 220 mg zinc sulfate daily -Continue 500 mg vitamin C daily -D/c Prostat, due to poor acceptance -D/c Ensure Max, due to poor acceptance -Nepro Shake po daily, each supplement provides 425 kcal and 19 grams protein  NUTRITION DIAGNOSIS:   Increased nutrient needs related to wound healing as evidenced by estimated needs.  Ongoing  GOAL:   Patient will meet greater than or equal to 90% of their needs  Progressing   MONITOR:   PO intake, Supplement acceptance, Labs, Weight trends, Skin, I & O's  REASON FOR ASSESSMENT:   Other (Comment)    ASSESSMENT:   The patient is a 68 yr old woman who was sent to the ED from home where she resides for the above chief complaints. She has a past medical history significant for DM II, Anemia, peripheral neuropathy, GERD, Hypertension, hypothyroidism, Paraplegia, Sacral decubitus ulcer, hyperlipidemia, and kidney stones. She has a suprapubic catheter due to neurogenic bladder.  Reviewed I/O's: -190 ml x 24 hours and -8.4 L since admission  UOP: 800 ml x 24 hours  Attempted to speak with pt via phone, however, no answer.   Pt's intake has improved; noted PO: 10-70%. Pt is refusing both Ensure Max and Prostat supplements despite education from staff, stating both supplements are too sweet.   Per MD notes, likely discharge back home in 1-2 days, as she is refusing SNF placement.   Labs reviewed: CBGS: 147-196 (inpatient orders for glycemic control are 0-15 units insulin aspart TID with meals and 18 units insulin glargine q HS).   Diet Order:   Diet Order            Diet Carb Modified Fluid consistency: Thin; Room service appropriate? No  Diet effective now              EDUCATION NEEDS:   No education needs have been identified at this time  Skin:  Skin Assessment: Skin Integrity Issues: Skin Integrity Issues:: Other  (Comment), Stage II, Stage IV Stage II: lt ischial tuberosity Stage IV: lt ischial tuberosity Other: lt ankle blister  Last BM:  09/22/18  Height:   Ht Readings from Last 1 Encounters:  09/15/18 5\' 2"  (1.575 m)    Weight:   Wt Readings from Last 1 Encounters:  09/21/18 89.4 kg    Ideal Body Weight:  46 kg(adjusted for paraplegia)  BMI:  Body mass index is 36.03 kg/m.  Estimated Nutritional Needs:   Kcal:  1600-1800  Protein:  100-115 grams  Fluid:  > 1.6 L    Suhaib Guzzo A. Jimmye Norman, RD, LDN, [place] Registered Dietitian II Certified Diabetes Care and Education Specialist Pager: [phone] After hours Pager: [phone]

## 2018-09-22 NOTE — Progress Notes (Signed)
Inpatient Diabetes Program Recommendations  AACE/ADA: New Consensus Statement on Inpatient Glycemic Control (2015)  Target Ranges:  Prepandial:   less than 140 mg/dL      Peak postprandial:   less than 180 mg/dL (1-2 hours)      Critically ill patients:  140 - 180 mg/dL   Lab Results  Component Value Date   GLUCAP 217 (H) 09/22/2018   HGBA1C 10.0 (H) 09/15/2018    Review of Glycemic Control Results for JALEENA, VIVIANI (MRN [id number]) as of 09/22/2018 12:58  Ref. Range 09/21/2018 21:41 09/22/2018 07:38 09/22/2018 11:37  Glucose-Capillary Latest Ref Range: 70 - 99 mg/dL 172 (H) 201 (H) 217 (H)   Diabetes history:DM 2 Outpatient Diabetes medications:Metformin 250 mg BID Current orders for Inpatient glycemic control:Novolog 0-15 tid, Lantus 18 units QHS  Inpatient Diabetes Program Recommendations:     Consider increasing Lantus to 20 units QHS.  If post prandials >180 mg/dL, consider adding Novolog 3 units TID (assuming patient is consuming >50% of meal).   Thanks, Bronson Curb, MSN, RNC-OB Diabetes Coordinator [phone] (8a-5p)

## 2018-09-22 NOTE — Progress Notes (Signed)
09/22/18 1757  PT Visit Information  Last PT Received On 09/22/18  Assistance Needed +2  PT/OT/SLP Co-Evaluation/Treatment Yes  Reason for Co-Treatment To address functional/ADL transfers;For patient/therapist safety;Complexity of the patient's impairments (multi-system involvement)  PT goals addressed during session Balance;Mobility/safety with mobility  History of Present Illness 68 y.o. female presenting to ED with fever, fatigue, lethargy, and hypotension. She has a suprapubic catheter due to neurogenic bladder. UA appears to be positive for a UTI. COVID-19 negative. Respiratory distress 6/11 and placed on BiPAP. PMH including DM II, Anemia, peripheral neuropathy, GERD, Hypertension, hypothyroidism, Paraplegia, Sacral decubitus ulcer, hyperlipidemia, and kidney stones.  Precautions  Precautions Fall;Other (comment)  Precaution Comments Paraplegia at baseline  Restrictions  Weight Bearing Restrictions No  Home Living  Family/patient expects to be discharged to: Private residence  Living Arrangements Spouse/significant other  Available Help at Discharge Family;Available 24 hours/day  Type of Home House  Home Access Stairs to enter (has portable ramp to put over steps if needed)  Entrance Stairs-Number of Steps 2  Home Layout One level  Bathroom Shower/Tub Other (comment) (Sponge bathes in bed)  Tiro Wheelchair - manual;Hospital bed  Prior Function  Level of Independence Needs assistance  Gait / Transfers Assistance Needed Total A and husband assist with stand pivot to w/c for doctor apt  ADL's / Homemaking Assistance Needed Total A for ADLs at bed level from husband, daughter, and aide (three days a week).   Communication  Communication No difficulties  Pain Assessment  Pain Assessment Faces  Faces Pain Scale 6  Pain Location BLEs and burning in hands  Pain Descriptors / Indicators Burning;Constant;Discomfort;Grimacing  Pain  Intervention(s) Limited activity within patient's tolerance;Monitored during session;Repositioned  Cognition  Arousal/Alertness Awake/alert  Behavior During Therapy WFL for tasks assessed/performed  Overall Cognitive Status Within Functional Limits for tasks assessed  Upper Extremity Assessment  Upper Extremity Assessment Defer to OT evaluation  Lower Extremity Assessment  Lower Extremity Assessment RLE deficits/detail;LLE deficits/detail  RLE Deficits / Details Paraplegia at baseline. Muscle spasms noted, however, no voluntary movement. Very limited ROM into knee and hip flexion. PF contractures noted.   LLE Deficits / Details Paraplegia at baseline. Muscle spasms noted, however, no voluntary movement. Very limited ROM into knee and hip flexion. PF contractures noted.   Cervical / Trunk Assessment  Cervical / Trunk Assessment Other exceptions  Cervical / Trunk Exceptions cervical spine injury  Bed Mobility  Overal bed mobility Needs Assistance  Bed Mobility Supine to Sit;Sit to Supine  Supine to sit Total assist;+2 for physical assistance  Sit to supine Total assist;+2 for physical assistance  General bed mobility comments Total A +2 for bed mobility and sitting at EOB. Pt able to reach for bed rails and assist in pulling towards side of bed  Balance  Overall balance assessment Needs assistance  Sitting-balance support Bilateral upper extremity supported;Feet supported  Sitting balance-Leahy Scale Zero  Sitting balance - Comments Requiring Max A for sitting at EOB and use of BUE support   General Comments  General comments (skin integrity, edema, etc.) SPo2 94% on 2L. BP supine 149/83 and EOB 137/91  PT - End of Session  Activity Tolerance Patient tolerated treatment well  Patient left in bed;with call bell/phone within reach  Nurse Communication Mobility status  PT Assessment  PT Recommendation/Assessment Patient needs continued PT services  PT Visit Diagnosis Muscle weakness  (generalized) (M62.81);Difficulty in walking, not elsewhere classified (R26.2)  PT Problem List Decreased strength;Decreased range  of motion;Decreased balance;Decreased activity tolerance;Decreased mobility;Decreased knowledge of use of DME;Decreased knowledge of precautions;Impaired tone  PT Plan  PT Frequency (ACUTE ONLY) Min 2X/week  PT Treatment/Interventions (ACUTE ONLY) Functional mobility training;Therapeutic activities;Therapeutic exercise;Balance training;Patient/family education;Wheelchair mobility training  AM-PAC PT "6 Clicks" Mobility Outcome Measure (Version 2)  Help needed turning from your back to your side while in a flat bed without using bedrails? 1  Help needed moving from lying on your back to sitting on the side of a flat bed without using bedrails? 1  Help needed moving to and from a bed to a chair (including a wheelchair)? 1  Help needed standing up from a chair using your arms (e.g., wheelchair or bedside chair)? 1  Help needed to walk in hospital room? 1  Help needed climbing 3-5 steps with a railing?  1  6 Click Score 6  Consider Recommendation of Discharge To: CIR/SNF/LTACH  PT Recommendation  Follow Up Recommendations Home health PT;Supervision/Assistance - 24 hour (pt refusing SNF )  PT equipment Other (comment) (hoyer lift, new air mattress)  Individuals Consulted  Consulted and Agree with Results and Recommendations Patient  Acute Rehab PT Goals  Patient Stated Goal "Go home"  PT Goal Formulation With patient  Time For Goal Achievement 10/06/18  Potential to Achieve Goals Fair  PT Time Calculation  PT Start Time (ACUTE ONLY) 1526  PT Stop Time (ACUTE ONLY) 1555  PT Time Calculation (min) (ACUTE ONLY) 29 min  PT General Charges  $$ ACUTE PT VISIT 1 Visit  PT Evaluation  $PT Eval Moderate Complexity 1 Mod   Pt admitted secondary to problem above with deficits below. PTA, pt's husband was assisting with transfers to [facility] when needed to go to the doctor,  however, otherwise pt reports she was mostly bed bound. Pt requiring total A +2 to perform bed mobility tasks this session. Pt likely close to baseline, however, feel she would benefit from trial basis of acute PT to work on transfers to chair and other mobility tasks. Feel pt would benefit from hoyer lift, however, pt reports she and her husband did not like to use a manual hoyer lift, so they will likely refuse DME. Pt also mentioning she needed new air mattress for home. Will continue to follow acutely to maximize functional mobility independence and safety.   Leighton Ruff, PT, DPT  Acute Rehabilitation Services  Pager: [phone] Office: [phone]

## 2018-09-22 NOTE — Progress Notes (Signed)
Thank you for consult on [name]. Chart reviewed and note that patient is a paraplegic who has been bed bound for years.   Recommend PT/OT evaluation to help determine follow up therapy needs after dicharge. Will defer CIR consult for now.

## 2018-09-23 LAB — CBC WITH DIFFERENTIAL/PLATELET
Abs Immature Granulocytes: 0.17 10*3/uL — ABNORMAL HIGH (ref 0.00–0.07)
Basophils Absolute: 0.1 10*3/uL (ref 0.0–0.1)
Basophils Relative: 1 %
Eosinophils Absolute: 0.2 10*3/uL (ref 0.0–0.5)
Eosinophils Relative: 2 %
HCT: 48.1 % — ABNORMAL HIGH (ref 36.0–46.0)
Hemoglobin: 15.5 g/dL — ABNORMAL HIGH (ref 12.0–15.0)
Immature Granulocytes: 2 %
Lymphocytes Relative: 23 %
Lymphs Abs: 2.2 10*3/uL (ref 0.7–4.0)
MCH: 30.3 pg (ref 26.0–34.0)
MCHC: 32.2 g/dL (ref 30.0–36.0)
MCV: 94.1 fL (ref 80.0–100.0)
Monocytes Absolute: 0.7 10*3/uL (ref 0.1–1.0)
Monocytes Relative: 8 %
Neutro Abs: 6.1 10*3/uL (ref 1.7–7.7)
Neutrophils Relative %: 64 %
Platelets: 377 10*3/uL (ref 150–400)
RBC: 5.11 MIL/uL (ref 3.87–5.11)
RDW: 13.1 % (ref 11.5–15.5)
WBC: 9.4 10*3/uL (ref 4.0–10.5)
nRBC: 0 % (ref 0.0–0.2)

## 2018-09-23 LAB — BASIC METABOLIC PANEL
Anion gap: 13 (ref 5–15)
BUN: 17 mg/dL (ref 8–23)
CO2: 25 mmol/L (ref 22–32)
Calcium: 9.5 mg/dL (ref 8.9–10.3)
Chloride: 103 mmol/L (ref 98–111)
Creatinine, Ser: 0.84 mg/dL (ref 0.44–1.00)
GFR calc Af Amer: >60 mL/min (ref >60)
GFR calc non Af Amer: >60 mL/min (ref >60)
Glucose, Bld: 160 mg/dL — ABNORMAL HIGH (ref 70–99)
Potassium: 3.9 mmol/L (ref 3.5–5.1)
Sodium: 141 mmol/L (ref 135–145)

## 2018-09-23 LAB — GLUCOSE, CAPILLARY
Glucose-Capillary: 166 mg/dL — ABNORMAL HIGH (ref 70–99)
Glucose-Capillary: 157 mg/dL — ABNORMAL HIGH (ref 70–99)

## 2018-09-23 LAB — MAGNESIUM: Magnesium: 2.2 mg/dL (ref 1.7–2.4)

## 2018-09-23 MED ORDER — FUROSEMIDE 40 MG PO TABS: ORAL_TABLET | ORAL | 0 refills | Status: DC

## 2018-09-23 MED ORDER — POLYETHYLENE GLYCOL 3350 17 G PO PACK: 17.0000 g | PACK | Freq: Two times a day (BID) | ORAL | 0 refills | Status: DC

## 2018-09-23 MED ORDER — MOMETASONE FURO-FORMOTEROL FUM 100-5 MCG/ACT IN AERO: 2.0000 | INHALATION_SPRAY | Freq: Two times a day (BID) | RESPIRATORY_TRACT | 0 refills | Status: DC

## 2018-09-23 MED ORDER — BISACODYL 10 MG RE SUPP: 10.0000 mg | Freq: Every day | RECTAL | 0 refills | Status: DC | PRN

## 2018-09-23 MED ORDER — IRBESARTAN 75 MG PO TABS: 150.0000 mg | ORAL_TABLET | Freq: Every day | ORAL | 0 refills | Status: DC

## 2018-09-23 MED ORDER — SENNOSIDES-DOCUSATE SODIUM 8.6-50 MG PO TABS: 1.0000 | ORAL_TABLET | Freq: Two times a day (BID) | ORAL | 0 refills | Status: DC

## 2018-09-23 NOTE — Discharge Summary (Addendum)
Physician Discharge Summary  [name] KGM:[id number] DOB: 1950-07-10 DOA: 09/15/2018  PCP: [name] Mariscal, MD  Admit date: 09/15/2018 Discharge date: 09/23/2018  Admitted From: Home Disposition: Home  Recommendations for Outpatient Follow-up:  1. Follow up with PCP in 1 week with repeat CBC/BMP 2. Follow-up with cardiology and pulmonary as an outpatient 3. With benefit from outpatient palliative care evaluation and follow-up 4. Follow up in ED if symptoms worsen or new appear   Home Health: Home with PT/OT Equipment/Devices: Oxygen via nasal cannula at 1 L/min  Discharge Condition: Guarded CODE STATUS: Full Diet recommendation: Heart healthy/fluid restriction up to 1500 cc a day  Brief/Interim Summary: 68 year old female with history of diabetes mellitus type 2, anemia, peripheral neuropathy, chronic suprapubic catheter due to neurogenic bladder, chronic pain, GERD, hypertension, hypothyroidism, paraplegia, sacral decubitus, hyperlipidemia, nephrolithiasis presented on 09/15/2018 with lethargy and fever.  She was found to have UTI.  CT abdomen pelvis demonstrated likely fecal impaction along with likely osteomyelitis involving left ischium related to chronic sacral decubitus ulcer.  During the hospitalization, patient was found to have Klebsiella bacteremia and has completed antibiotic therapy as per ID.  She also developed acute hypoxic/hypercapnic respiratory failure requiring high flow nasal cannula along with BiPAP.  PCCM was consulted.  All narcotics were discontinued.  She was started on IV diuretics.  During the hospitalization, she has diuresed well.  Cardiology was consulted.  PCCM was reconsulted.  Her oxygen requirement has much improved but is still requiring oxygen via nasal cannula at 1 L/min.  Cardiology has cleared the patient for discharge home on oral Lasix.   Discharge Diagnoses:  Principal Problem:   Bacteremia due to Klebsiella pneumoniae Active Problems:   Diabetes  type 2, uncontrolled ([name])   Sepsis due to urinary tract infection (HCC)   Left perineal ischial pressure ulcer   Acute respiratory failure with hypoxia and hypercapnia (HCC)   Palliative care by specialist   Advanced care planning/counseling discussion   Spondylosis, cervical, with myelopathy  Acute hypoxic/hypercapnic respiratory failure -Patient does not use oxygen at home -Most likely secondary to hypercapnia from narcotic use along with CHF exacerbation -Initially required high flow nasal cannula oxygen along with BiPAP -PCCM was consulted: Recommended BiPAP and IV diuretics -CT of the chest on 09/20/2018 had shown groundglass opacities.  There was no evidence of pneumothorax.  Pulmonary was reconsulted on 09/22/2018. -Currently on 1 L oxygen via nasal cannula.  Patient is paraplegic and hardly able to walk.  She will be discharged on oxygen via nasal cannula at 1 L/min.  Outpatient follow-up with pulmonary. -Continue diuretics as below  Acute on chronic diastolic CHF -Echo showed EF of 50 to 55%. -Negative balance of 9973.9 cc since admission.  Currently on Lasix 60 mg IV every 12 hours.    Cardiology evaluation appreciated.  Cardiology is okay for the patient to be discharged on Lasix 80 mg in a.m. and 40 mg in p.m. till 09/26/2018 followed by Lasix 80 mg in a.m. from 09/27/2018-10/03/2018 and then subsequently 40 mg in a.m. from 10/04/2018 onwards.  Cardiology also recommends increasing irbesartan to 150 mg daily. continue current dose of metoprolol and nifedipine.  Sepsis due to UTI: Present on admission Klebsiella bacteremia Klebsiella and E. coli UTI -Sepsis is resolved.  Patient has completed 7-day course of Rocephin IV as per ID recommendations.  hypertension -Monitor blood pressure.  Continue Lasix as above, metoprolol, nifedipine and irbesartan  Diabetes mellitus type 2 with hyperglycemia -Outpatient follow-up.  Continue home regimen of metformin.  Sacral decubitus  ulcer -Wound care as per wound care recommendations.  No evidence of osteomyelitis as per ID.  Outpatient follow-up with wound care -As per wound care nurse recommendations: Apply Aquacel Ag+ for the wound covered by foam dressing  Chronic paraplegia Neurogenic bladder with history of suprapubic catheter Constipation Chronic pain -Patient is paraplegic.  She has been on chronic pain medication.  I spoke with Dr. Greta [name] on phone on 09/22/2018 who is her outpatient pain management specialist.  She told me that patient has been on morphine sulfate extended release 30 mg twice a day and Percocet 10-325 every 6 hours as needed for many years.  Dr. Greta [name] stated that patient might benefit from inpatient rehabilitation for spinal cord rehab.   -CIR evaluation appreciated.  Patient apparently does not qualify for CIR.  Will need home health PT/OT -Long-acting morphine sulfate was discontinued during hospitalization because of respiratory status.  I will keep morphine sulfate on hold on discharge and this can be resumed as an outpatient if patient has persistent pain.  This can be followed up with PCP and/or Dr. Greta [name].  Short-acting Percocet can be used as needed in the interim. -Continue bowel regimen -PT/OT eval -Palliative care consult for goals appreciated.  Recommend outpatient palliative care evaluation and follow-up. -Patient/family has refused SNF  Discharge Instructions  Discharge Instructions    (HEART FAILURE PATIENTS) Call MD:  Anytime you have any of the following symptoms: 1) 3 pound weight gain in 24 hours or 5 pounds in 1 week 2) shortness of breath, with or without a dry hacking cough 3) swelling in the hands, feet or stomach 4) if you have to sleep on extra pillows at night in order to breathe.   Complete by: As directed    Ambulatory referral to Cardiology   Complete by: As directed    CHF followup   Ambulatory referral to Pulmonology   Complete by: As directed    Hypoxia   Call MD  for:  difficulty breathing, headache or visual disturbances   Complete by: As directed    Call MD for:  persistant dizziness or light-headedness   Complete by: As directed    Diet - low sodium heart healthy   Complete by: As directed    Increase activity slowly   Complete by: As directed      Allergies as of 09/23/2018      Reactions   Ivp Dye [iodinated Diagnostic Agents] Other (See Comments)   "hot and sweaty and almost passed out"   Aspirin Hives   Sulfa Antibiotics Hives      Medication List    STOP taking these medications   ciprofloxacin 500 MG tablet Commonly known as: CIPRO   Morphine Sulfate ER 30 MG T12a   senna 8.6 MG tablet Commonly known as: SENOKOT     TAKE these medications   ascorbic acid 500 MG tablet Commonly known as: VITAMIN C Take 1 tablet (500 mg total) by mouth daily.   baclofen 20 MG tablet Commonly known as: LIORESAL Take 20 mg by mouth every 6 (six) hours as needed for muscle spasms.   bisacodyl 10 MG suppository Commonly known as: DULCOLAX Place 1 suppository (10 mg total) rectally daily as needed for severe constipation.   docusate sodium 100 MG capsule Commonly known as: COLACE Take 100 mg by mouth 2 (two) times daily as needed (FOR CONSTIPATION).   DULoxetine 60 MG capsule Commonly known as: CYMBALTA Take 120 mg by mouth daily.  ferrous sulfate 325 (65 FE) MG tablet Take 325 mg by mouth daily after supper.   folic acid 1 MG tablet Commonly known as: FOLVITE Take 1 mg by mouth daily.   furosemide 40 MG tablet Commonly known as: LASIX 80mg  in AM and 40mg  in PM till 09/26/18, then 80mg  in AM from 09/27/18 till 10/03/18, then 40mg  in AM from 10/04/18 onwards What changed:   how much to take  how to take this  when to take this  additional instructions   irbesartan 75 MG tablet Commonly known as: AVAPRO Take 2 tablets (150 mg total) by mouth daily. Start taking on: September 24, 2018   levothyroxine 125 MCG tablet Commonly  known as: SYNTHROID Take 1 tablet (125 mcg total) by mouth daily before breakfast. What changed: when to take this   metFORMIN 500 MG tablet Commonly known as: GLUCOPHAGE Take 250 mg by mouth 2 (two) times daily with a meal.   metoprolol tartrate 100 MG tablet Commonly known as: LOPRESSOR Take 100 mg by mouth 2 (two) times daily.   mometasone-formoterol 100-5 MCG/ACT Aero Commonly known as: DULERA Inhale 2 puffs into the lungs 2 (two) times daily.   multivitamin with minerals Tabs tablet Take 1 tablet by mouth daily.   NIFEdipine 30 MG 24 hr tablet Commonly known as: PROCARDIA-XL/NIFEDICAL-XL Take 30 mg by mouth daily after supper.   oxyCODONE-acetaminophen 10-325 MG tablet Commonly known as: PERCOCET Take 1 tablet by mouth every 6 (six) hours as needed for pain.   polyethylene glycol 17 g packet Commonly known as: MIRALAX / GLYCOLAX Take 17 g by mouth 2 (two) times daily.   pregabalin 75 MG capsule Commonly known as: LYRICA Take 75 mg by mouth 2 (two) times daily.   senna-docusate 8.6-50 MG tablet Commonly known as: Senokot-S Take 1 tablet by mouth 2 (two) times daily.   zinc sulfate 220 (50 Zn) MG capsule Take 1 capsule (220 mg total) by mouth daily.            Durable Medical Equipment  (From admission, onward)         Start     Ordered   09/23/18 1328  DME Oxygen  Once    Question Answer Comment  Length of Need 6 Months   Mode or (Route) Mask   Liters per Minute 1   Oxygen conserving device Yes   Oxygen delivery system Gas      09/23/18 1328         Follow-up Information    [name] Mariscal, MD. Schedule an appointment as soon as possible for a visit in 1 week(s).   Specialty: Internal Medicine Why: with repeat cbc/bmp Contact information: [place] [address] [phone]        Leonie Man, MD .   Specialty: Cardiology Contact information: [address] [place] [address] [place]. Schedule an appointment as soon as possible for a visit in 1 week(s).   Contact information: [place] [address] [phone]         Allergies  Allergen Reactions  . Ivp Dye [Iodinated Diagnostic Agents] Other (See Comments)    "hot and sweaty and almost passed out"  . Aspirin Hives  . Sulfa Antibiotics Hives    Consultations:  Cardiology/PCCM/palliative care   Procedures/Studies: Ct Abdomen Pelvis Wo Contrast  Result Date: 09/15/2018 CLINICAL DATA:  High grade bowel obstruction, history breast  cancer, diabetes mellitus, hypertension, GERD, kidney stones EXAM: CT ABDOMEN AND PELVIS WITHOUT CONTRAST TECHNIQUE: Multidetector CT imaging of the abdomen and pelvis was performed following the standard protocol without IV contrast. Sagittal and coronal MPR images reconstructed from axial data set. No oral contrast was administered COMPARISON:  04/27/2017 FINDINGS: Lower chest: Bibasilar atelectasis and minimal RIGHT pleural effusion Hepatobiliary: Diffuse fatty infiltration of liver. Minimal dependent density in gallbladder question tiny layered calculi. No focal hepatic mass lesion. Pancreas: Atrophic pancreas without mass Spleen: Calcified granulomata.  No mass. Adrenals/Urinary Tract: Adrenal thickening without focal mass. Adjacent calculi at inferior pole RIGHT kidney, larger 8 mm diameter. Small focus of gas identified within collecting system at inferior pole RIGHT kidney, uncertain etiology. Tiny nonobstructing calculi at inferior pole LEFT kidney. Vague area of low attenuation at upper pole LEFT kidney may represent a subtle cyst, unchanged. Kidneys otherwise unremarkable. No additional renal mass or hydronephrosis. Suprapubic catheter into decompressed urinary bladder. Stomach/Bowel: Normal appendix. Stomach decompressed. Scattered stool throughout [name], with significantly increased stool in rectum. Bowel loops otherwise normal  appearance without dilatation, evidence of obstruction or wall thickening. Vascular/Lymphatic: Atherosclerotic calcifications aorta and iliac arteries without aneurysm. Reproductive: Uterus surgically absent. Question atrophic ovaries bilaterally. Other: No free air or free fluid.  No hernia. Musculoskeletal: Osseous demineralization. Loss of cortical margination of the inferior aspect of the LEFT ischium, with overlying decubitus track, suspicious for osteomyelitis. IMPRESSION: BILATERAL nonobstructing renal calculi. Small focus of gas within collecting system mid inferior pole RIGHT kidney of uncertain etiology and significance. Suspected small cyst at upper pole LEFT kidney. Markedly increased stool in rectum. Bone destruction/loss of cortical margination at inferior aspect of the LEFT ischium suspicious for osteomyelitis. Fatty infiltration of liver. Questionable cholelithiasis. Electronically Signed   By: Lavonia Dana M.D.   On: 09/15/2018 17:15   Dg Chest 2 View  Result Date: 09/20/2018 CLINICAL DATA:  Shortness of breath EXAM: CHEST - 2 VIEW COMPARISON:  Same day prior examination FINDINGS: Slight interval increase in diffuse interstitial pulmonary opacity, perhaps reflecting worsening edema. There is no clear right basilar pneumothorax; an unusual, oblique linear opacity is again noted over the mid right hemithorax raising the possibility of a right basilar pneumothorax. Lateral view is not revealing. Disc degenerative disease and ankylosis of the thoracic spine. IMPRESSION: 1. Slight interval increase in diffuse interstitial pulmonary opacity, perhaps reflecting worsening edema. 2. There is no clear right basilar pneumothorax; an unusual, oblique linear opacity is again noted over the mid right hemithorax raising the possibility of a right basilar pneumothorax. Lateral view is not revealing. If there is high clinical concern for pneumothorax, CT could be used for definitive evaluation. Electronically  Signed   By: Eddie Candle M.D.   On: 09/20/2018 13:27   Ct Head Wo Contrast  Result Date: 09/15/2018 CLINICAL DATA:  Altered level of consciousness. EXAM: CT HEAD WITHOUT CONTRAST TECHNIQUE: Contiguous axial images were obtained from the base of the skull through the vertex without intravenous contrast. COMPARISON:  CT scan of Aug 10, 2015. FINDINGS: Brain: Minimal chronic ischemic white matter disease is noted. No mass effect or midline shift is noted. Ventricular size is within normal limits. There is no evidence of mass lesion, hemorrhage or acute infarction. Vascular: No hyperdense vessel or unexpected calcification. Skull: Normal. Negative for fracture or focal lesion. Sinuses/Orbits: No acute finding. Other: None. IMPRESSION: Minimal chronic ischemic white matter disease. No acute intracranial abnormality seen. Electronically Signed   By: Marijo Conception M.D.   On:  09/15/2018 13:34   Ct Chest Wo Contrast  Result Date: 09/20/2018 CLINICAL DATA:  Right basilar pneumothorax on prior chest x-ray. EXAM: CT CHEST WITHOUT CONTRAST TECHNIQUE: Multidetector CT imaging of the chest was performed following the standard protocol without IV contrast. COMPARISON:  Chest x-rays earlier today FINDINGS: Cardiovascular: Heart is normal size. Scattered aortic calcifications. No evidence of aortic aneurysm. Mediastinum/Nodes: Calcified left hilar and mediastinal lymph nodes compatible with old granulomatous disease. No mediastinal, hilar, or axillary adenopathy. Lungs/Pleura: Calcified granulomas in the right upper lobe and superior segment of the left lower lobe. Extensive ground-glass airspace opacities in both upper lobes and superior segments of the lower lobes. Ground-glass opacities scattered in both lower lobes and right middle lobe. No effusions. No pneumothorax as questioned on prior x-ray. Upper Abdomen: Calcifications in the spleen compatible with old granulomatous disease. Diffuse fatty infiltration of the  liver. Musculoskeletal: Chest wall soft tissues are unremarkable. No acute bony abnormality. IMPRESSION: No pneumothorax as questioned on prior chest x-rays. Old granulomatous disease. Ground-glass opacities most confluent in the upper lobes, also noted in the lower lobes and right middle lobe. Given the normal heart size, this is concerning for infection, less likely edema. Fatty infiltration of the liver. Aortic Atherosclerosis (ICD10-I70.0). Electronically Signed   By: Rolm Baptise M.D.   On: 09/20/2018 21:42   Dg Chest Port 1 View  Result Date: 09/20/2018 CLINICAL DATA:  Pulmonary edema EXAM: PORTABLE CHEST 1 VIEW COMPARISON:  09/17/2018 FINDINGS: Diffuse bilateral interstitial thickening. Subtle lucency at the right lung base which may be artifactual versus a basilar pneumothorax. Stable cardiomediastinal silhouette. No aggressive osseous lesion. IMPRESSION: 1. Subtle lucency at the right lung base which may be artifactual versus a basilar pneumothorax. Recommend repeat in 24 hours or a PA and lateral x-ray. Electronically Signed   By: Kathreen Devoid   On: 09/20/2018 10:08   Dg Chest Port 1 View  Result Date: 09/17/2018 CLINICAL DATA:  New onset shortness of breath EXAM: PORTABLE CHEST 1 VIEW COMPARISON:  09/15/2018 FINDINGS: There are diffuse bilateral hazy airspace opacities. The heart size is enlarged. There is no pneumothorax. No large pleural effusion. There is some elevation of the right hemidiaphragm. There is no acute osseous abnormality. There are degenerative changes of both shoulders. IMPRESSION: 1. Worsening hazy bilateral airspace opacities which can be seen in patients with pulmonary edema versus an atypical infectious process such as viral pneumonia. 2. Borderline enlarged heart. Electronically Signed   By: Constance Holster M.D.   On: 09/17/2018 00:32   Dg Chest Port 1 View  Result Date: 09/15/2018 CLINICAL DATA:  Fever, hypoxia and altered mental status. EXAM: PORTABLE CHEST 1 VIEW  COMPARISON:  04/26/2017 FINDINGS: The heart is borderline enlarged but stable. The mediastinal and hilar contours are slightly prominent but unchanged. Low lung volumes with vascular crowding and atelectasis. Underlying chronic lung changes. No definite infiltrates or effusions. The bony thorax is intact. IMPRESSION: Low lung volumes with vascular crowding and atelectasis and underlying chronic lung changes but no definite acute infiltrates. Electronically Signed   By: Marijo Sanes M.D.   On: 09/15/2018 12:21   Dg Abd Portable 1v  Result Date: 09/17/2018 CLINICAL DATA:  Ileus EXAM: PORTABLE ABDOMEN - 1 VIEW COMPARISON:  CT 09/15/2018 FINDINGS: Gas within nondistended large and small bowel. Moderate stool throughout the [name]. Calcifications project over the lower pole of the right kidney as seen on prior CT, stable. No evidence of bowel obstruction. IMPRESSION: Right lower pole nephrolithiasis. No evidence of bowel  obstruction. Moderate stool burden throughout the [name]. Electronically Signed   By: Rolm Baptise M.D.   On: 09/17/2018 19:19    Echo IMPRESSIONS   1. The left ventricle has low normal systolic function, with an ejection fraction of 50-55%. The cavity size was normal. There is mildly increased left ventricular wall thickness. Left ventricular diastolic Doppler parameters are consistent with  impaired relaxation. No evidence of left ventricular regional wall motion abnormalities. 2. The right ventricle has normal systolic function. The cavity was normal. There is no increase in right ventricular wall thickness. 3. The aortic valve was not well visualized. Mild thickening of the aortic valve. Mild calcification of the aortic valve. Aortic valve regurgitation is trivial by color flow Doppler.    Subjective: Patient seen and examined at bedside.  She wants to go home.  No overnight fever nausea or vomiting.  Feels that her breathing is better.  Discharge Exam: Vitals:   09/23/18  0446 09/23/18 0728  BP: (!) 154/78   Pulse: (!) 102   Resp: 20   Temp: 97.9 F (36.6 C)   SpO2: 97% 97%    General: Pt is alert, awake, not in acute distress.  Looks chronically ill.  Appears older than stated age. Cardiovascular: Slightly tachycardic, S1/S2 + Respiratory: bilateral decreased breath sounds at bases with basilar crackles Abdominal: Soft, NT, ND, bowel sounds + Extremities: Trace edema, no cyanosis    The results of significant diagnostics from this hospitalization (including imaging, microbiology, ancillary and laboratory) are listed below for reference.     Microbiology: Recent Results (from the past 240 hour(s))  Urine culture     Status: Abnormal   Collection Time: 09/15/18 11:53 AM   Specimen: Urine, Random  Result Value Ref Range Status   Specimen Description URINE, RANDOM  Final   Special Requests   Final    NONE Performed at [facility], [address], [place], [place] [address]    Culture (A)  Final    >=100,000 COLONIES/mL KLEBSIELLA PNEUMONIAE >=100,000 COLONIES/mL ESCHERICHIA COLI    Report Status 09/18/2018 FINAL  Final   Organism ID, Bacteria KLEBSIELLA PNEUMONIAE (A)  Final   Organism ID, Bacteria ESCHERICHIA COLI (A)  Final      Susceptibility   Escherichia coli - MIC*    AMPICILLIN <=2 SENSITIVE Sensitive     CEFAZOLIN <=4 SENSITIVE Sensitive     CEFTRIAXONE <=1 SENSITIVE Sensitive     CIPROFLOXACIN <=0.25 SENSITIVE Sensitive     GENTAMICIN <=1 SENSITIVE Sensitive     IMIPENEM <=0.25 SENSITIVE Sensitive     NITROFURANTOIN <=16 SENSITIVE Sensitive     TRIMETH/SULFA <=20 SENSITIVE Sensitive     AMPICILLIN/SULBACTAM <=2 SENSITIVE Sensitive     PIP/TAZO <=4 SENSITIVE Sensitive     Extended ESBL NEGATIVE Sensitive     * >=100,000 COLONIES/mL ESCHERICHIA COLI   Klebsiella pneumoniae - MIC*    AMPICILLIN >=32 RESISTANT Resistant     CEFAZOLIN <=4 SENSITIVE Sensitive     CEFTRIAXONE <=1 SENSITIVE Sensitive     CIPROFLOXACIN  <=0.25 SENSITIVE Sensitive     GENTAMICIN <=1 SENSITIVE Sensitive     IMIPENEM <=0.25 SENSITIVE Sensitive     NITROFURANTOIN 128 RESISTANT Resistant     TRIMETH/SULFA <=20 SENSITIVE Sensitive     AMPICILLIN/SULBACTAM >=32 RESISTANT Resistant     PIP/TAZO <=4 SENSITIVE Sensitive     Extended ESBL NEGATIVE Sensitive     * >=100,000 COLONIES/mL KLEBSIELLA PNEUMONIAE  SARS Coronavirus 2 (CEPHEID- Performed in [facility] hospital lab), Hosp Order     Status: None   Collection Time: 09/15/18 11:54 AM   Specimen: Nasopharyngeal Swab  Result Value Ref Range Status   SARS Coronavirus 2 NEGATIVE NEGATIVE Final    Comment: (NOTE) If result is NEGATIVE SARS-CoV-2 target nucleic acids are NOT DETECTED. The SARS-CoV-2 RNA is generally detectable in upper and lower  respiratory specimens during the acute phase of infection. The lowest  concentration of SARS-CoV-2 viral copies this assay can detect is 250  copies / mL. A negative result does not preclude SARS-CoV-2 infection  and should not be used as the sole basis for treatment or other  patient management decisions.  A negative result may occur with  improper specimen collection / handling, submission of specimen other  than nasopharyngeal swab, presence of viral mutation(s) within the  areas targeted by this assay, and inadequate number of viral copies  (<250 copies / mL). A negative result must be combined with clinical  observations, patient history, and epidemiological information. If result is POSITIVE SARS-CoV-2 target nucleic acids are DETECTED. The SARS-CoV-2 RNA is generally detectable in upper and lower  respiratory specimens dur ing the acute phase of infection.  Positive  results are indicative of active infection with SARS-CoV-2.  Clinical  correlation with patient history and other diagnostic information is  necessary to determine patient infection status.  Positive results do  not rule out bacterial infection or co-infection  with other viruses. If result is PRESUMPTIVE POSTIVE SARS-CoV-2 nucleic acids MAY BE PRESENT.   A presumptive positive result was obtained on the submitted specimen  and confirmed on repeat testing.  While 2019 novel coronavirus  (SARS-CoV-2) nucleic acids may be present in the submitted sample  additional confirmatory testing may be necessary for epidemiological  and / or clinical management purposes  to differentiate between  SARS-CoV-2 and other Sarbecovirus currently known to infect humans.  If clinically indicated additional testing with an alternate test  methodology [phone]) is advised. The SARS-CoV-2 RNA is generally  detectable in upper and lower respiratory sp ecimens during the acute  phase of infection. The expected result is Negative. Fact Sheet for Patients:  StrictlyIdeas.no Fact Sheet for Healthcare Providers: BankingDealers.co.za This test is not yet approved or cleared by the [place] FDA and has been authorized for detection and/or diagnosis of SARS-CoV-2 by FDA under an Emergency Use Authorization (EUA).  This EUA will remain in effect (meaning this test can be used) for the duration of the COVID-19 declaration under Section 564(b)(1) of the Act, 21 U.S.C. section 360bbb-3(b)(1), unless the authorization is terminated or revoked sooner. Performed at [facility], [place] [address], [place], [place] [address]   Blood Culture (routine x 2)     Status: Abnormal   Collection Time: 09/15/18 11:58 AM   Specimen: BLOOD  Result Value Ref Range Status   Specimen Description BLOOD BLOOD RIGHT FOREARM  Final   Special Requests   Final    BOTTLES DRAWN AEROBIC AND ANAEROBIC Blood Culture adequate volume   Culture  Setup Time   Final    GRAM NEGATIVE RODS AEROBIC BOTTLE ONLY CRITICAL VALUE NOTED.  VALUE IS CONSISTENT WITH PREVIOUSLY REPORTED AND CALLED VALUE.    Culture (A)  Final    KLEBSIELLA  PNEUMONIAE SUSCEPTIBILITIES PERFORMED ON PREVIOUS CULTURE WITHIN THE LAST 5 DAYS. Performed at [facility], [place] [address], [place], [place] [address]    Report Status 09/18/2018 FINAL  Final  Blood Culture (routine  x 2)     Status: Abnormal   Collection Time: 09/15/18 12:36 PM   Specimen: BLOOD  Result Value Ref Range Status   Specimen Description BLOOD SITE NOT SPECIFIED  Final   Special Requests   Final    BOTTLES DRAWN AEROBIC AND ANAEROBIC Blood Culture results may not be optimal due to an inadequate volume of blood received in culture bottles   Culture  Setup Time   Final    IN BOTH AEROBIC AND ANAEROBIC BOTTLES Organism ID to follow GRAM NEGATIVE RODS CRITICAL RESULT CALLED TO, READ BACK BY AND VERIFIED WITH: MCCARTHY PHARMD AT 0800 ON [id number] BY SJW Performed at [facility], [place] [address], [place], [place] [address]    Culture KLEBSIELLA PNEUMONIAE (A)  Final   Report Status 09/18/2018 FINAL  Final   Organism ID, Bacteria KLEBSIELLA PNEUMONIAE  Final      Susceptibility   Klebsiella pneumoniae - MIC*    AMPICILLIN >=32 RESISTANT Resistant     CEFAZOLIN <=4 SENSITIVE Sensitive     CEFEPIME <=1 SENSITIVE Sensitive     CEFTAZIDIME <=1 SENSITIVE Sensitive     CEFTRIAXONE <=1 SENSITIVE Sensitive     CIPROFLOXACIN <=0.25 SENSITIVE Sensitive     GENTAMICIN <=1 SENSITIVE Sensitive     IMIPENEM 0.5 SENSITIVE Sensitive     TRIMETH/SULFA <=20 SENSITIVE Sensitive     AMPICILLIN/SULBACTAM >=32 RESISTANT Resistant     PIP/TAZO 8 SENSITIVE Sensitive     Extended ESBL NEGATIVE Sensitive     * KLEBSIELLA PNEUMONIAE  Blood Culture ID Panel (Reflexed)     Status: Abnormal   Collection Time: 09/15/18 12:36 PM  Result Value Ref Range Status   Enterococcus species NOT DETECTED NOT DETECTED Final   Listeria monocytogenes NOT DETECTED NOT DETECTED Final   Staphylococcus species NOT DETECTED NOT DETECTED Final   Staphylococcus aureus (BCID) NOT DETECTED NOT DETECTED Final    Streptococcus species NOT DETECTED NOT DETECTED Final   Streptococcus agalactiae NOT DETECTED NOT DETECTED Final   Streptococcus pneumoniae NOT DETECTED NOT DETECTED Final   Streptococcus pyogenes NOT DETECTED NOT DETECTED Final   Acinetobacter baumannii NOT DETECTED NOT DETECTED Final   Enterobacteriaceae species DETECTED (A) NOT DETECTED Final    Comment: Enterobacteriaceae represent a large family of gram-negative bacteria, not a single organism. CRITICAL RESULT CALLED TO, READ BACK BY AND VERIFIED WITH: MCCARTHY PHARMD AT 0800 ON [id number] BY SJW    Enterobacter cloacae complex NOT DETECTED NOT DETECTED Final   Escherichia coli NOT DETECTED NOT DETECTED Final   Klebsiella oxytoca NOT DETECTED NOT DETECTED Final   Klebsiella pneumoniae DETECTED (A) NOT DETECTED Final    Comment: CRITICAL RESULT CALLED TO, READ BACK BY AND VERIFIED WITH: MCCARTHY PHARMD AT 0800 ON [id number] BY SJW    Proteus species NOT DETECTED NOT DETECTED Final   Serratia marcescens NOT DETECTED NOT DETECTED Final   Carbapenem resistance NOT DETECTED NOT DETECTED Final   Haemophilus influenzae NOT DETECTED NOT DETECTED Final   Neisseria meningitidis NOT DETECTED NOT DETECTED Final   Pseudomonas aeruginosa NOT DETECTED NOT DETECTED Final   Candida albicans NOT DETECTED NOT DETECTED Final   Candida glabrata NOT DETECTED NOT DETECTED Final   Candida krusei NOT DETECTED NOT DETECTED Final   Candida parapsilosis NOT DETECTED NOT DETECTED Final   Candida tropicalis NOT DETECTED NOT DETECTED Final    Comment: Performed at [facility], [address], [place], [place] [address]  MRSA PCR Screening  Status: Abnormal   Collection Time: 09/16/18  3:37 AM   Specimen: Nasopharyngeal  Result Value Ref Range Status   MRSA by PCR POSITIVE (A) NEGATIVE Final    Comment:        The GeneXpert MRSA Assay (FDA approved for NASAL specimens only), is one component of a comprehensive MRSA colonization surveillance program. It  is not intended to diagnose MRSA infection nor to guide or monitor treatment for MRSA infections. RESULT CALLED TO, READ BACK BY AND VERIFIED WITH: LOFTIN,Q RN 09/16/2018 AT 4166 SKEEN,P Performed at [facility], [place] [address] [place], [place] [address]      Labs: BNP (last 3 results) Recent Labs    09/15/18 1153 09/17/18 1445  BNP 76.7 601.0*   Basic Metabolic Panel: Recent Labs  Lab 09/17/18 0326  09/19/18 0421 09/20/18 0251 09/21/18 0304 09/22/18 0342 09/23/18 0252  NA 135   < > 138 141 141 141 141  K 4.6   < > 3.6 3.5 3.5 3.8 3.9  CL 101   < > 99 103 104 105 103  CO2 24   < > 24 25 22 23 25   GLUCOSE 932*   < > 234* 203* 182* 211* 160*  BUN 16   < > 12 15 17 17 17   CREATININE 1.13*   < > 0.82 0.77 0.71 0.78 0.84  CALCIUM 8.6*   < > 9.3 9.3 9.1 9.3 9.5  MG 1.9  --   --  2.2  --   --  2.2   < > = values in this interval not displayed.   Liver Function Tests: Recent Labs  Lab 09/19/18 0421  AST 42*  ALT 53*  ALKPHOS 73  BILITOT 1.0  PROT 9.2*  ALBUMIN 3.4*   No results for input(s): LIPASE, AMYLASE in the last 168 hours. No results for input(s): AMMONIA in the last 168 hours. CBC: Recent Labs  Lab 09/17/18 0326 09/18/18 1903 09/19/18 0421 09/21/18 0304 09/23/18 0252  WBC 10.5 10.1 7.9 10.5 9.4  NEUTROABS  --   --   --   --  6.1  HGB 14.0 14.1 15.3* 15.1* 15.5*  HCT 43.1 42.8 46.9* 46.8* 48.1*  MCV 94.7 92.8 93.4 93.8 94.1  PLT 191 216 256 339 377   Cardiac Enzymes: No results for input(s): CKTOTAL, CKMB, CKMBINDEX, TROPONINI in the last 168 hours. BNP: Invalid input(s): POCBNP CBG: Recent Labs  Lab 09/22/18 1137 09/22/18 1642 09/22/18 2139 09/23/18 0843 09/23/18 1316  GLUCAP 217* 199* 165* 166* 157*   D-Dimer No results for input(s): DDIMER in the last 72 hours. Hgb A1c No results for input(s): HGBA1C in the last 72 hours. Lipid Profile No results for input(s): CHOL, HDL, LDLCALC, TRIG, CHOLHDL, LDLDIRECT in the last 72  hours. Thyroid function studies No results for input(s): TSH, T4TOTAL, T3FREE, THYROIDAB in the last 72 hours.  Invalid input(s): FREET3 Anemia work up No results for input(s): VITAMINB12, FOLATE, FERRITIN, TIBC, IRON, RETICCTPCT in the last 72 hours. Urinalysis    Component Value Date/Time   COLORURINE AMBER (A) 09/15/2018 1153   APPEARANCEUR CLOUDY (A) 09/15/2018 1153   LABSPEC 1.019 09/15/2018 1153   PHURINE 5.0 09/15/2018 1153   GLUCOSEU NEGATIVE 09/15/2018 1153   HGBUR MODERATE (A) 09/15/2018 1153   BILIRUBINUR NEGATIVE 09/15/2018 1153   KETONESUR 5 (A) 09/15/2018 1153   PROTEINUR 100 (A) 09/15/2018 1153   UROBILINOGEN 1.0 11/01/2014 1127   NITRITE NEGATIVE 09/15/2018 1153   LEUKOCYTESUR LARGE (A) 09/15/2018  1153   Sepsis Labs Invalid input(s): PROCALCITONIN,  WBC,  LACTICIDVEN Microbiology Recent Results (from the past 240 hour(s))  Urine culture     Status: Abnormal   Collection Time: 09/15/18 11:53 AM   Specimen: Urine, Random  Result Value Ref Range Status   Specimen Description URINE, RANDOM  Final   Special Requests   Final    NONE Performed at [facility], [address] [place], [place] [address]    Culture (A)  Final    >=100,000 COLONIES/mL KLEBSIELLA PNEUMONIAE >=100,000 COLONIES/mL ESCHERICHIA COLI    Report Status 09/18/2018 FINAL  Final   Organism ID, Bacteria KLEBSIELLA PNEUMONIAE (A)  Final   Organism ID, Bacteria ESCHERICHIA COLI (A)  Final      Susceptibility   Escherichia coli - MIC*    AMPICILLIN <=2 SENSITIVE Sensitive     CEFAZOLIN <=4 SENSITIVE Sensitive     CEFTRIAXONE <=1 SENSITIVE Sensitive     CIPROFLOXACIN <=0.25 SENSITIVE Sensitive     GENTAMICIN <=1 SENSITIVE Sensitive     IMIPENEM <=0.25 SENSITIVE Sensitive     NITROFURANTOIN <=16 SENSITIVE Sensitive     TRIMETH/SULFA <=20 SENSITIVE Sensitive     AMPICILLIN/SULBACTAM <=2 SENSITIVE Sensitive     PIP/TAZO <=4 SENSITIVE Sensitive     Extended ESBL NEGATIVE Sensitive      * >=100,000 COLONIES/mL ESCHERICHIA COLI   Klebsiella pneumoniae - MIC*    AMPICILLIN >=32 RESISTANT Resistant     CEFAZOLIN <=4 SENSITIVE Sensitive     CEFTRIAXONE <=1 SENSITIVE Sensitive     CIPROFLOXACIN <=0.25 SENSITIVE Sensitive     GENTAMICIN <=1 SENSITIVE Sensitive     IMIPENEM <=0.25 SENSITIVE Sensitive     NITROFURANTOIN 128 RESISTANT Resistant     TRIMETH/SULFA <=20 SENSITIVE Sensitive     AMPICILLIN/SULBACTAM >=32 RESISTANT Resistant     PIP/TAZO <=4 SENSITIVE Sensitive     Extended ESBL NEGATIVE Sensitive     * >=100,000 COLONIES/mL KLEBSIELLA PNEUMONIAE  SARS Coronavirus 2 (CEPHEID- Performed in [place] [facility] lab), Hosp Order     Status: None   Collection Time: 09/15/18 11:54 AM   Specimen: Nasopharyngeal Swab  Result Value Ref Range Status   SARS Coronavirus 2 NEGATIVE NEGATIVE Final    Comment: (NOTE) If result is NEGATIVE SARS-CoV-2 target nucleic acids are NOT DETECTED. The SARS-CoV-2 RNA is generally detectable in upper and lower  respiratory specimens during the acute phase of infection. The lowest  concentration of SARS-CoV-2 viral copies this assay can detect is 250  copies / mL. A negative result does not preclude SARS-CoV-2 infection  and should not be used as the sole basis for treatment or other  patient management decisions.  A negative result may occur with  improper specimen collection / handling, submission of specimen other  than nasopharyngeal swab, presence of viral mutation(s) within the  areas targeted by this assay, and inadequate number of viral copies  (<250 copies / mL). A negative result must be combined with clinical  observations, patient history, and epidemiological information. If result is POSITIVE SARS-CoV-2 target nucleic acids are DETECTED. The SARS-CoV-2 RNA is generally detectable in upper and lower  respiratory specimens dur ing the acute phase of infection.  Positive  results are indicative of active infection with  SARS-CoV-2.  Clinical  correlation with patient history and other diagnostic information is  necessary to determine patient infection status.  Positive results do  not rule out bacterial infection or co-infection with other viruses. If result is PRESUMPTIVE  POSTIVE SARS-CoV-2 nucleic acids MAY BE PRESENT.   A presumptive positive result was obtained on the submitted specimen  and confirmed on repeat testing.  While 2019 novel coronavirus  (SARS-CoV-2) nucleic acids may be present in the submitted sample  additional confirmatory testing may be necessary for epidemiological  and / or clinical management purposes  to differentiate between  SARS-CoV-2 and other Sarbecovirus currently known to infect humans.  If clinically indicated additional testing with an alternate test  methodology [phone]) is advised. The SARS-CoV-2 RNA is generally  detectable in upper and lower respiratory sp ecimens during the acute  phase of infection. The expected result is Negative. Fact Sheet for Patients:  StrictlyIdeas.no Fact Sheet for Healthcare Providers: BankingDealers.co.za This test is not yet approved or cleared by the [place] FDA and has been authorized for detection and/or diagnosis of SARS-CoV-2 by FDA under an Emergency Use Authorization (EUA).  This EUA will remain in effect (meaning this test can be used) for the duration of the COVID-19 declaration under Section 564(b)(1) of the Act, 21 U.S.C. section 360bbb-3(b)(1), unless the authorization is terminated or revoked sooner. Performed at [facility], [place] [address] [place], [place] [address]   Blood Culture (routine x 2)     Status: Abnormal   Collection Time: 09/15/18 11:58 AM   Specimen: BLOOD  Result Value Ref Range Status   Specimen Description BLOOD BLOOD RIGHT FOREARM  Final   Special Requests   Final    BOTTLES DRAWN AEROBIC AND ANAEROBIC Blood Culture adequate volume    Culture  Setup Time   Final    GRAM NEGATIVE RODS AEROBIC BOTTLE ONLY CRITICAL VALUE NOTED.  VALUE IS CONSISTENT WITH PREVIOUSLY REPORTED AND CALLED VALUE.    Culture (A)  Final    KLEBSIELLA PNEUMONIAE SUSCEPTIBILITIES PERFORMED ON PREVIOUS CULTURE WITHIN THE LAST 5 DAYS. Performed at [facility], [place] [address], [place],  [address]    Report Status 09/18/2018 FINAL  Final  Blood Culture (routine x 2)     Status: Abnormal   Collection Time: 09/15/18 12:36 PM   Specimen: BLOOD  Result Value Ref Range Status   Specimen Description BLOOD SITE NOT SPECIFIED  Final   Special Requests   Final    BOTTLES DRAWN AEROBIC AND ANAEROBIC Blood Culture results may not be optimal due to an inadequate volume of blood received in culture bottles   Culture  Setup Time   Final    IN BOTH AEROBIC AND ANAEROBIC BOTTLES Organism ID to follow GRAM NEGATIVE RODS CRITICAL RESULT CALLED TO, READ BACK BY AND VERIFIED WITH: MCCARTHY PHARMD AT 0800 ON [id number] BY SJW Performed at [facility], [place] [address], [place], [place] [address]    Culture KLEBSIELLA PNEUMONIAE (A)  Final   Report Status 09/18/2018 FINAL  Final   Organism ID, Bacteria KLEBSIELLA PNEUMONIAE  Final      Susceptibility   Klebsiella pneumoniae - MIC*    AMPICILLIN >=32 RESISTANT Resistant     CEFAZOLIN <=4 SENSITIVE Sensitive     CEFEPIME <=1 SENSITIVE Sensitive     CEFTAZIDIME <=1 SENSITIVE Sensitive     CEFTRIAXONE <=1 SENSITIVE Sensitive     CIPROFLOXACIN <=0.25 SENSITIVE Sensitive     GENTAMICIN <=1 SENSITIVE Sensitive     IMIPENEM 0.5 SENSITIVE Sensitive     TRIMETH/SULFA <=20 SENSITIVE Sensitive     AMPICILLIN/SULBACTAM >=32 RESISTANT Resistant     PIP/TAZO 8 SENSITIVE Sensitive     Extended ESBL  NEGATIVE Sensitive     * KLEBSIELLA PNEUMONIAE  Blood Culture ID Panel (Reflexed)     Status: Abnormal   Collection Time: 09/15/18 12:36 PM  Result Value Ref Range Status   Enterococcus species NOT DETECTED NOT  DETECTED Final   Listeria monocytogenes NOT DETECTED NOT DETECTED Final   Staphylococcus species NOT DETECTED NOT DETECTED Final   Staphylococcus aureus (BCID) NOT DETECTED NOT DETECTED Final   Streptococcus species NOT DETECTED NOT DETECTED Final   Streptococcus agalactiae NOT DETECTED NOT DETECTED Final   Streptococcus pneumoniae NOT DETECTED NOT DETECTED Final   Streptococcus pyogenes NOT DETECTED NOT DETECTED Final   Acinetobacter baumannii NOT DETECTED NOT DETECTED Final   Enterobacteriaceae species DETECTED (A) NOT DETECTED Final    Comment: Enterobacteriaceae represent a large family of gram-negative bacteria, not a single organism. CRITICAL RESULT CALLED TO, READ BACK BY AND VERIFIED WITH: MCCARTHY PHARMD AT 0800 ON [id number] BY SJW    Enterobacter cloacae complex NOT DETECTED NOT DETECTED Final   Escherichia coli NOT DETECTED NOT DETECTED Final   Klebsiella oxytoca NOT DETECTED NOT DETECTED Final   Klebsiella pneumoniae DETECTED (A) NOT DETECTED Final    Comment: CRITICAL RESULT CALLED TO, READ BACK BY AND VERIFIED WITH: MCCARTHY PHARMD AT 0800 ON [id number] BY SJW    Proteus species NOT DETECTED NOT DETECTED Final   Serratia marcescens NOT DETECTED NOT DETECTED Final   Carbapenem resistance NOT DETECTED NOT DETECTED Final   Haemophilus influenzae NOT DETECTED NOT DETECTED Final   Neisseria meningitidis NOT DETECTED NOT DETECTED Final   Pseudomonas aeruginosa NOT DETECTED NOT DETECTED Final   Candida albicans NOT DETECTED NOT DETECTED Final   Candida glabrata NOT DETECTED NOT DETECTED Final   Candida krusei NOT DETECTED NOT DETECTED Final   Candida parapsilosis NOT DETECTED NOT DETECTED Final   Candida tropicalis NOT DETECTED NOT DETECTED Final    Comment: Performed at [facility], [address], [place], [place] [address]  MRSA PCR Screening     Status: Abnormal   Collection Time: 09/16/18  3:37 AM   Specimen: Nasopharyngeal  Result Value Ref Range Status   MRSA by PCR  POSITIVE (A) NEGATIVE Final    Comment:        The GeneXpert MRSA Assay (FDA approved for NASAL specimens only), is one component of a comprehensive MRSA colonization surveillance program. It is not intended to diagnose MRSA infection nor to guide or monitor treatment for MRSA infections. RESULT CALLED TO, READ BACK BY AND VERIFIED WITH: LOFTIN,Q RN 09/16/2018 AT 1607 SKEEN,P Performed at [facility], [place] [address], [place], [place] [address]      Time coordinating discharge: 35 minutes  SIGNED:   Aline August, MD  [facility] 09/23/2018, 1:28 PM

## 2018-09-23 NOTE — Progress Notes (Signed)
Inpatient Rehab Admissions:  Inpatient Rehab Consult received.  PT/OT evaluations reviewed, as well as note from Algis Liming, PA.  Note pt has paraplegia at baseline, currently total +2 for mobility and therapy recommending home with home health and support of husband.  Will sign off at this time.   Shann Medal, PT, DPT Admissions Coordinator [phone] 09/23/18  9:01 AM

## 2018-09-23 NOTE — Progress Notes (Signed)
   Patient seen for consultation yesterday.  Still on IV Lasix, but breathing better.  On less oxygen.  Still diuresing quite well.  Not sure when she reaches euvolemia, consistent difficult to evaluate on exam.  She still has not had signs of elevated BUN or creatinine.  My understanding is that the patient is planned to be discharged today per patient wishes.  Since he is clinically stable this is not unreasonable, but difficult to determine the best course of action going forward since he is still on IV Lasix.  I have been asked to comment on diuretic regimen.   [facility] will sign off.  Since she is being discharged Medication Recommendations:    Increase irbesartan 250 mg daily  Would do Lasix 80 mg p.o. every morning and 40 mg p.o. every afternoon through the weekend.  Starting Monday would do 80 mg p.o. for 1 week and then reduce to 40 mg daily for standing dose.  Other recommendations (labs, testing, etc): Probably needs BMP checked by the end of next week Follow up as an outpatient: We will arrange outpatient follow-up in our clinic.    Glenetta Hew, M.D., M.S. Interventional Cardiologist   Pager # [phone] Phone # [phone] [address] [place], [place] [address]

## 2018-09-23 NOTE — Progress Notes (Signed)
Daily Progress Note   Patient Name: [name]       Date: 09/23/2018 DOB: July 07, 1950  Age: 68 y.o. MRN#: [id number] Attending Physician: Aline August, MD Primary Care Physician: [name], MD Admit Date: 09/15/2018  Reason for Consultation/Follow-up: Establishing goals of care  Subjective: Patient awake, alert, on 1L O2. States she just wants to go home. She requested that I call her daughter. We discussed more the role of Palliative medicine and she was receptive, however, she would like her family to be present for advanced directives and [place] discussion.  I called her daughter, Gerhard Perches. Answered her questions related to Palliative medicine. Gerhard Perches is interested in advanced care planning, however, would prefer to do it after patient is discharged home. She requests Palliative service followup in the community.   ROS  Length of Stay: 8  Current Medications: Scheduled Meds:  . chlorhexidine  15 mL Mouth Rinse BID  . DULoxetine  120 mg Oral Daily  . feeding supplement (NEPRO CARB STEADY)  237 mL Oral QHS  . ferrous sulfate  325 mg Oral QPC supper  . folic acid  1 mg Oral Daily  . furosemide  60 mg Intravenous Q12H  . heparin  5,000 Units Subcutaneous Q8H  . insulin aspart  0-15 Units Subcutaneous TID WC  . insulin glargine  20 Units Subcutaneous QHS  . irbesartan  75 mg Oral Daily  . levothyroxine  125 mcg Oral QAC breakfast  . mouth rinse  15 mL Mouth Rinse q12n4p  . metoprolol tartrate  100 mg Oral BID  . mometasone-formoterol  2 puff Inhalation BID  . multivitamin with minerals  1 tablet Oral Daily  . NIFEdipine  30 mg Oral QPC supper  . polyethylene glycol  17 g Oral BID  . pregabalin  75 mg Oral BID  . senna-docusate  1 tablet Oral BID  . ascorbic acid  500 mg Oral Daily   . zinc sulfate  220 mg Oral Daily    Continuous Infusions: . sodium chloride 10 mL/hr at 09/20/18 2035    PRN Meds: sodium chloride, baclofen, bisacodyl, docusate sodium, ipratropium-albuterol, LORazepam, oxyCODONE-acetaminophen **AND** oxyCODONE  Physical Exam          Vital Signs: BP (!) 154/78 (BP Location: Right Arm)   Pulse (!) 102   Temp 97.9 F (36.6  C) (Oral)   Resp 20   Ht 5\' 2"  (1.575 m)   Wt 89.4 kg   SpO2 97%   BMI 36.03 kg/m  SpO2: SpO2: 97 % O2 Device: O2 Device: Nasal Cannula O2 Flow Rate: O2 Flow Rate (L/min): 1 L/min  Intake/output summary:   Intake/Output Summary (Last 24 hours) at 09/23/2018 1228 Last data filed at 09/23/2018 2505 Gross per 24 hour  Intake -  Output 1550 ml  Net -1550 ml   LBM: Last BM Date: 09/22/18 Baseline Weight: Weight: 90.7 kg Most recent weight: Weight: 89.4 kg       Palliative Assessment/Data: PPS: 30%      Patient Active Problem List   Diagnosis Date Noted  . Palliative care by specialist   . Advanced care planning/counseling discussion   . Spondylosis, cervical, with myelopathy   . Acute respiratory failure with hypoxia and hypercapnia (HCC)   . Left perineal ischial pressure ulcer 09/16/2018  . Bacteremia due to Klebsiella pneumoniae 09/16/2018  . Sepsis due to urinary tract infection ([place]) 09/15/2018  . Diabetes type 2, uncontrolled ([place]) 04/26/2017  . Urinary tract infection associated with cystostomy catheter ([place])   . Medication monitoring encounter 12/03/2015  . Hypertension   . History of nephrolithiasis   . AKI (acute kidney injury) ([place]) 08/10/2015  . Sepsis ([place]) 08/10/2015  . Hypotension 08/10/2015  . Altered mental status 08/10/2015  . Leukocytosis 08/10/2015  . Kidney stones 08/10/2015  . Acute kidney injury ([place])   . Encephalopathy acute   . Osteomyelitis ([place]) 11/06/2014  . Sacral decubitus ulcer   . Paraplegia ([place]) 11/01/2014  . Sacral ulcer ([place]) 10/31/2014  . Hypothyroidism  10/31/2014  . Breast cancer of lower-inner quadrant of right female breast ([place]) 12/22/2012  . HTN (hypertension) 04/15/2012  . Left knee DJD 04/13/2012    Class: Chronic    Palliative Care Assessment & Plan   Patient Profile: 68 y.o. female  with past medical history of quadraplegia d/t cervical myelopathy, neurogenic bladder w/ suprapubic cath, HTN, HLD,  admitted on 09/15/2018 with fever and fatigue. Workup revealed sepsis bacteremia, +urine culture, recovery complicated by respiratory failure requiring bipap. She is now down to 2L O2. CT scan showed ground glass opacities concerning for infection. Patient reports weeks of productive cough prior to admission. Palliative medicine consulted for [place].    Assessment/Recommendations/Plan   Care manager referral made for Palliative medicine home services referral  Family wishes patient dc'd home with [place] was discussed with patient's daughterGerhard Perches.  Thank you for allowing the Palliative Medicine Team to assist in the care of this patient.   Time In: 1200 Time Out: 1235 Total Time 35 minutes Prolonged Time Billed no      Greater than 50%  of this time was spent counseling and coordinating care related to the above assessment and plan.  Mariana Kaufman, AGNP-C Palliative Medicine   Please contact Palliative Medicine Team phone at [phone] for questions and concerns.

## 2018-09-23 NOTE — Progress Notes (Signed)
Pt prepared for DC. Oxygen delivered to room. Pt & family given discharge instructions, prescriptions, and care notes. Pt's family verbalized understanding AEB no further questions or concerns at this time. IV was discontinued, no redness, pain, or swelling noted at this time. Pt left the floor via wheelchair with staff in stable condition.

## 2018-09-23 NOTE — TOC Transition Note (Signed)
Transition of Care [facility]) - CM/SW Discharge Note   Patient Details  Name: [name] MRN: [id number] Date of Birth: 11-05-1950  Transition of Care [facility]) CM/SW Contact:  Sharin Mons, RN Phone Number: 09/23/2018, 5:05 PM   Clinical Narrative:  Admitted with  Bacteremia due to Klebsiella pneumoniae, hx of  diabetes mellitus type 2, anemia, peripheral neuropathy, chronic suprapubic catheter due to neurogenic bladder, chronic pain, GERD, hypertension, hypothyroidism, paraplegia, sacral decubitus, hyperlipidemia, nephrolithiasis. Pt will transition to home today. Pt/ husband DECLINED placement with SNF.  Referral made with [facility] for outpatient palliative services/ GOC. Choice given to pt/ husband regarding home health services. [facility] selected. [facility] declined. [facility] declined. Referral made with [facility] for home health services, awaiting response. NCM made pt/husband aware. NCM to f/u in am with pt/husband.  Portable oxygen tank will be delivered to beside prior to d/c.  [employer] (Spouse) Babs Bertin (Daughter)    [phone]      Portable oxygen tank will be delivered to beside prior to d/c. Husband to provide transportation to home.   Final next level of care: Home/Self Care Barriers to Discharge: No Barriers Identified   Patient Goals and CMS Choice Patient states their goals for this hospitalization and ongoing recovery are:: to go home CMS Medicare.gov Compare Post Acute Care list provided to:: Patient Choice offered to / list presented to : Patient, Spouse, Adult Children  Discharge Placement                       Discharge Plan and Services In-house Referral: Clinical Social Work Discharge Planning Services: CM Consult Post Acute Care Choice: NA          DME Arranged: Oxygen DME Agency: [employer] Date DME Agency Contacted: 09/23/18   Representative spoke with at DME Agency: Zack            Social Determinants of  Health ([place]) Interventions     Readmission Risk Interventions No flowsheet data found.

## 2018-09-23 NOTE — Progress Notes (Signed)
SATURATION QUALIFICATIONS: (This note is used to comply with regulatory documentation for home oxygen)  Patient Saturations on Room Air at Rest = 87%  Patient Saturations on 1Liters of oxygen at rest = 94%  Patient Saturations on Room Air while Ambulating = N/A due to Pt not able to walk  Patient Saturations on Liters of oxygen while Ambulating = N/A due to Pt not able to walk  Please briefly explain why patient needs home oxygen: Pt needs home O2 in order to keep O2 sats above 88%

## 2018-09-23 NOTE — Consult Note (Addendum)
NAME:  [name], MRN:  [id number], DOB:  1950-04-21, LOS: 8 ADMISSION DATE:  09/15/2018, CONSULTATION DATE: 6/12 REFERRING MD: 6/10, CHIEF COMPLAINT: Acute hypoxic respiratory failure  Brief History   Paraplegic 68 year old female with known history of sacral osteomyelitis and more recently extension into left ischial osteo-admitted 6/10 with sepsis due to Klebsiella bacteremia from what appeared to be urinary tract source with chronic suprapubic catheter.  Pulmonary asked to see on 6/12 for worsening respiratory distress and hypoxic respiratory failure  Past Medical History  Poorly controlled diabetes, left perineal ischial pressure ulcer, sacral decubitus, suprapubic catheter due to neurogenic bladder, peripheral neuropathy, hypertension, hypothyroidism, paraplegia  Significant Hospital Events   6/10: Admitted with fever lethargy and fatigue.  CT imaging demonstrated fecal impaction also osteomyelitis involving left ischium and sacral decubitus ulcer.  Admitted with working diagnosis of sepsis, culture sent, started on antibiotics.   6/11: Seen by infectious disease, preliminary blood cultures showing Klebsiella pneumonia bacteremia with Klebsiella also growing in urine culture.  They felt suprapubic catheter likely the source.  Noted to be more short of breath, pulse oximetry 88 to 89% on 3 L.  She was desaturate when oxygen was removed.  She was placed on high flow oxygen, progressively became less responsive, arterial blood gas showed pH of 7.01, PCO2 of 93, PO2 of 74.  Placed on non-invasive positive pressure ventilation and given IV Lasix, about 2 hours after She was much more awake and interactive, PCO2 had dropped down to 47 PO2 is improved.  She had stabilized by the early hours of 6/12. 6/12: Still reporting shortness of breath, having multiple complaints primarily regarding comfort and abdominal pain.  She did get an enema earlier today reporting some results but not completely  relieved.  Pulse oximetry in the low 80s on 12 L nasal cannula pulmonary asked to reevaluate.  Follow-up arterial blood gas showing adequate ventilation with pH of 7.44 and PCO2 of 41, she was more hypoxic with PO2 of 45, bicarbonate 27.5 pulse oximetry 82% we placed her back on BiPAP 6/17 Weaned to 2L O2   Procedures:    Significant Diagnostic Tests:  6/10 CT abdomen pelvis: BILATERAL nonobstructing renal calculi.Small focus of gas within collecting system mid inferior pole RIGHT kidney of uncertain etiology and significance. Suspected small cyst at upper pole LEFT kidney. Markedly increased stool in rectum. Bone destruction/loss of cortical margination at inferior aspect of the LEFT ischium suspicious for osteomyelitis. Fatty infiltration of liver. Questionable cholelithiasis.  6/15 CT chest >> no PTX, persistent B upper lobe predominant GGO   Micro Data:  Blood culture 6/10 >> Klebsiella pneumoniae Urine culture 6/10 >> Klebsiella pneumoniae pansensitive also growing E coli  Antimicrobials:  Vancomycin 6/10 >> 6/11 Cefepime 6/10 >> 6/11 Ceftriaxone 6/11 >>  Interim history/subjective:  Pt reports feeling better.  Significant "jumping of her legs" since being off home meds. Pain in her hands.   Objective   Blood pressure (!) 154/78, pulse (!) 102, temperature 97.9 F (36.6 C), temperature source Oral, resp. rate 20, height 5\' 2"  (1.575 m), weight 89.4 kg, SpO2 97 %.        Intake/Output Summary (Last 24 hours) at 09/23/2018 1030 Last data filed at 09/23/2018 0909 Gross per 24 hour  Intake -  Output 1550 ml  Net -1550 ml   Filed Weights   09/15/18 1538 09/15/18 2237 09/21/18 0500  Weight: 90.7 kg 100.1 kg 89.4 kg    Examination: General:  Chronically ill appearing female lying in bed  in NAD HEENT: MM pink/moist, no jvd Neuro: AAOx4, speech clear, moves upper extremities  CV: s1s2 rrr, no m/r/g PULM: even/non-labored, lungs bilaterally clear anterior, no wheeze   DU:KGUR, non-tender, bsx4 active  Extremities: warm/dry, no edema  Skin: no rashes or lesions  Resolved Hospital Problem list   Hypercarbic respiratory failure  Assessment & Plan:   Acute hypoxic respiratory failure.  Some component abdominal compliance, resp suppression earlier in the course, but suspect current O2 need due largely to her B pulmonary infiltrates. Multifactorial with component of dCHF, but also at significant risk for non-cardiogenic edema / acute lung injury due to her sepsis. Consider also other causes of pneumonitis including occult aspiration (she has no overt symptoms).  P: Hold narcotics.  Diuretics as BP / Sr Cr tolerate > may be near euvolemia  Likely a component of ALI that will be slow to resolve on imaging  Follow serial CXR  Wean O2 to off as tolerated  Pulmonary hygiene as able  Consider MBS to rule out aspiration   Sepsis due to klebsiella  pneumoniae bacteremia w/ klebsiella PNA UTI and E coli UTI, in setting of chronic suprapubic catheter P: Per primary / ID  Acute on chronic diastolic heart failure with pulmonary edema P: Per primary / Cardiology   Hypothyroidism P: Per primary  Continue replacement  Chronic decubitus ulcer including the sacrum and left ischium Paraplegia  P: Wound care per primary Not a candidate for CIR at this time  Consider reduced dose introduction of antispasmodics (baclofen) and to avoid withdrawal.   Best practice:  Diet: carb modified.  Pain/Anxiety/Delirium protocol (if indicated): Not indicated VAP protocol (if indicated): Not indicated DVT prophylaxis: Subcutaneous heparin GI prophylaxis: Not indicated Glucose control: Sliding scale insulin Mobility: Advance as tolerated Code Status: Full code Family Communication: Per primary Disposition: per primary.  PCCM will sign off. Please call back if we can be of further assistance.    Labs   CBC: Recent Labs  Lab 09/17/18 0326 09/18/18 1903 09/19/18  0421 09/21/18 0304 09/23/18 0252  WBC 10.5 10.1 7.9 10.5 9.4  NEUTROABS  --   --   --   --  6.1  HGB 14.0 14.1 15.3* 15.1* 15.5*  HCT 43.1 42.8 46.9* 46.8* 48.1*  MCV 94.7 92.8 93.4 93.8 94.1  PLT 191 216 256 339 427    Basic Metabolic Panel: Recent Labs  Lab 09/17/18 0326  09/19/18 0421 09/20/18 0251 09/21/18 0304 09/22/18 0342 09/23/18 0252  NA 135   < > 138 141 141 141 141  K 4.6   < > 3.6 3.5 3.5 3.8 3.9  CL 101   < > 99 103 104 105 103  CO2 24   < > 24 25 22 23 25   GLUCOSE 322*   < > 234* 203* 182* 211* 160*  BUN 16   < > 12 15 17 17 17   CREATININE 1.13*   < > 0.82 0.77 0.71 0.78 0.84  CALCIUM 8.6*   < > 9.3 9.3 9.1 9.3 9.5  MG 1.9  --   --  2.2  --   --  2.2   < > = values in this interval not displayed.   GFR: Estimated Creatinine Clearance: 64.7 mL/min (by C-G formula based on SCr of 0.84 mg/dL). Recent Labs  Lab 09/17/18 1454 09/18/18 1903 09/19/18 0421 09/21/18 0304 09/23/18 0252  WBC  --  10.1 7.9 10.5 9.4  LATICACIDVEN 3.1*  --   --   --   --  Liver Function Tests: Recent Labs  Lab 09/19/18 0421  AST 42*  ALT 53*  ALKPHOS 73  BILITOT 1.0  PROT 9.2*  ALBUMIN 3.4*   No results for input(s): LIPASE, AMYLASE in the last 168 hours. No results for input(s): AMMONIA in the last 168 hours.  ABG    Component Value Date/Time   PHART 7.442 09/17/2018 1445   PCO2ART 41.1 09/17/2018 1445   PO2ART 45.5 (L) 09/17/2018 1445   HCO3 27.5 09/17/2018 1445   TCO2 28 09/15/2018 1739   ACIDBASEDEF 7.3 (H) 09/17/2018 0003   O2SAT 81.9 09/17/2018 1445     Coagulation Profile: No results for input(s): INR, PROTIME in the last 168 hours.  Cardiac Enzymes: No results for input(s): CKTOTAL, CKMB, CKMBINDEX, TROPONINI in the last 168 hours.  HbA1C: Hgb A1c MFr Bld  Date/Time Value Ref Range Status  09/15/2018 10:41 PM 10.0 (H) 4.8 - 5.6 % Final    Comment:    (NOTE) Pre diabetes:          5.7%-6.4% Diabetes:              >6.4% Glycemic control for    <7.0% adults with diabetes   04/18/2016 02:30 PM 11.7 (H) 4.8 - 5.6 % Final    Comment:    (NOTE)         Pre-diabetes: 5.7 - 6.4         Diabetes: >6.4         Glycemic control for adults with diabetes: <7.0     CBG: Recent Labs  Lab 09/22/18 0738 09/22/18 1137 09/22/18 1642 09/22/18 2139 09/23/18 0843  GLUCAP 201* 217* 199* [place], MSN, NP-C [facility] Pgr: [phone] or if no answer [phone] 09/23/2018, 10:30 AM

## 2018-09-24 ENCOUNTER — Telehealth: Payer: Self-pay | Admitting: *Deleted

## 2018-09-24 ENCOUNTER — Telehealth: Payer: Self-pay | Admitting: Cardiology

## 2018-09-24 NOTE — Telephone Encounter (Signed)
Called patients home phone, but, it just rang with no VM.  Called patient on cell phone and LVM to call back and schedule 3 week hospital followup.

## 2018-09-24 NOTE — Telephone Encounter (Addendum)
Received call from pt's dtr, Babs Bertin requesting info on [facility] agency assigned. Contacted TOC CM for updated. Sent message to [facility]. Spoke to [employer], and [facility] refused. [place] and not in network. Contacted [facility], waiting on call back. Jonnie Finner RN CCM Case Mgmt phone [phone]

## 2018-09-25 ENCOUNTER — Emergency Department (HOSPITAL_COMMUNITY)
Admission: EM | Admit: 2018-09-25 | Discharge: 2018-09-25 | Disposition: A | Discharge status: Home / Self Care | Payer: PPO | Attending: Emergency Medicine | Admitting: Emergency Medicine

## 2018-09-25 ENCOUNTER — Other Ambulatory Visit: Payer: Self-pay

## 2018-09-25 ENCOUNTER — Emergency Department (HOSPITAL_COMMUNITY): Payer: PPO

## 2018-09-25 ENCOUNTER — Encounter (HOSPITAL_COMMUNITY): Payer: Self-pay | Admitting: Emergency Medicine

## 2018-09-25 DIAGNOSIS — R000 Tachycardia, unspecified: Secondary | ICD-10-CM | POA: Diagnosis not present

## 2018-09-25 DIAGNOSIS — R0902 Hypoxemia: Secondary | ICD-10-CM | POA: Diagnosis not present

## 2018-09-25 DIAGNOSIS — Z7984 Long term (current) use of oral hypoglycemic drugs: Secondary | ICD-10-CM | POA: Insufficient documentation

## 2018-09-25 DIAGNOSIS — R52 Pain, unspecified: Secondary | ICD-10-CM | POA: Diagnosis not present

## 2018-09-25 DIAGNOSIS — K760 Fatty (change of) liver, not elsewhere classified: Secondary | ICD-10-CM | POA: Diagnosis not present

## 2018-09-25 DIAGNOSIS — Z466 Encounter for fitting and adjustment of urinary device: Secondary | ICD-10-CM | POA: Insufficient documentation

## 2018-09-25 DIAGNOSIS — R410 Disorientation, unspecified: Secondary | ICD-10-CM | POA: Diagnosis not present

## 2018-09-25 DIAGNOSIS — Z79899 Other long term (current) drug therapy: Secondary | ICD-10-CM | POA: Insufficient documentation

## 2018-09-25 DIAGNOSIS — Z853 Personal history of malignant neoplasm of breast: Secondary | ICD-10-CM | POA: Diagnosis not present

## 2018-09-25 DIAGNOSIS — Z7401 Bed confinement status: Secondary | ICD-10-CM | POA: Diagnosis not present

## 2018-09-25 DIAGNOSIS — I10 Essential (primary) hypertension: Secondary | ICD-10-CM | POA: Diagnosis not present

## 2018-09-25 DIAGNOSIS — N390 Urinary tract infection, site not specified: Secondary | ICD-10-CM | POA: Insufficient documentation

## 2018-09-25 DIAGNOSIS — Z87891 Personal history of nicotine dependence: Secondary | ICD-10-CM | POA: Diagnosis not present

## 2018-09-25 DIAGNOSIS — N3289 Other specified disorders of bladder: Secondary | ICD-10-CM | POA: Diagnosis not present

## 2018-09-25 DIAGNOSIS — R4182 Altered mental status, unspecified: Secondary | ICD-10-CM | POA: Diagnosis not present

## 2018-09-25 DIAGNOSIS — N200 Calculus of kidney: Secondary | ICD-10-CM | POA: Diagnosis not present

## 2018-09-25 DIAGNOSIS — E119 Type 2 diabetes mellitus without complications: Secondary | ICD-10-CM | POA: Diagnosis not present

## 2018-09-25 DIAGNOSIS — E039 Hypothyroidism, unspecified: Secondary | ICD-10-CM | POA: Diagnosis not present

## 2018-09-25 DIAGNOSIS — M2550 Pain in unspecified joint: Secondary | ICD-10-CM | POA: Diagnosis not present

## 2018-09-25 DIAGNOSIS — K5900 Constipation, unspecified: Secondary | ICD-10-CM | POA: Diagnosis present

## 2018-09-25 DIAGNOSIS — R531 Weakness: Secondary | ICD-10-CM | POA: Diagnosis not present

## 2018-09-25 DIAGNOSIS — R5381 Other malaise: Secondary | ICD-10-CM | POA: Diagnosis not present

## 2018-09-25 LAB — CBC WITH DIFFERENTIAL/PLATELET
Abs Immature Granulocytes: 0.1 10*3/uL — ABNORMAL HIGH (ref 0.00–0.07)
Basophils Absolute: 0.1 10*3/uL (ref 0.0–0.1)
Basophils Relative: 1 %
Eosinophils Absolute: 0.2 10*3/uL (ref 0.0–0.5)
Eosinophils Relative: 2 %
HCT: 44.3 % (ref 36.0–46.0)
Hemoglobin: 13.9 g/dL (ref 12.0–15.0)
Immature Granulocytes: 1 %
Lymphocytes Relative: 26 %
Lymphs Abs: 2.3 10*3/uL (ref 0.7–4.0)
MCH: 29.8 pg (ref 26.0–34.0)
MCHC: 31.4 g/dL (ref 30.0–36.0)
MCV: 94.9 fL (ref 80.0–100.0)
Monocytes Absolute: 0.5 10*3/uL (ref 0.1–1.0)
Monocytes Relative: 6 %
Neutro Abs: 5.6 10*3/uL (ref 1.7–7.7)
Neutrophils Relative %: 64 %
Platelets: 395 10*3/uL (ref 150–400)
RBC: 4.67 MIL/uL (ref 3.87–5.11)
RDW: 13.2 % (ref 11.5–15.5)
WBC: 8.8 10*3/uL (ref 4.0–10.5)
nRBC: 0 % (ref 0.0–0.2)

## 2018-09-25 LAB — URINALYSIS, ROUTINE W REFLEX MICROSCOPIC
Bilirubin Urine: NEGATIVE
Glucose, UA: NEGATIVE mg/dL
Hgb urine dipstick: NEGATIVE
Ketones, ur: NEGATIVE mg/dL
Nitrite: POSITIVE — AB
Protein, ur: 30 mg/dL — AB
Specific Gravity, Urine: 1.008 (ref 1.005–1.030)
pH: 5 (ref 5.0–8.0)

## 2018-09-25 LAB — BASIC METABOLIC PANEL
Anion gap: 12 (ref 5–15)
BUN: 26 mg/dL — ABNORMAL HIGH (ref 8–23)
CO2: 26 mmol/L (ref 22–32)
Calcium: 9.1 mg/dL (ref 8.9–10.3)
Chloride: 97 mmol/L — ABNORMAL LOW (ref 98–111)
Creatinine, Ser: 1.26 mg/dL — ABNORMAL HIGH (ref 0.44–1.00)
GFR calc Af Amer: 50 mL/min — ABNORMAL LOW (ref >60)
GFR calc non Af Amer: 43 mL/min — ABNORMAL LOW (ref >60)
Glucose, Bld: 112 mg/dL — ABNORMAL HIGH (ref 70–99)
Potassium: 3.5 mmol/L (ref 3.5–5.1)
Sodium: 135 mmol/L (ref 135–145)

## 2018-09-25 MED ORDER — SODIUM CHLORIDE 0.9 % IV BOLUS
500.0000 mL | Freq: Once | INTRAVENOUS | Status: AC
  Administered 2018-09-25: 500 mL via INTRAVENOUS

## 2018-09-25 MED ORDER — CEPHALEXIN 250 MG PO CAPS
500.0000 mg | ORAL_CAPSULE | Freq: Once | ORAL | Status: AC
  Administered 2018-09-25: 500 mg via ORAL
  Filled 2018-09-25: qty 2

## 2018-09-25 MED ORDER — CEPHALEXIN 500 MG PO CAPS: 500.0000 mg | ORAL_CAPSULE | Freq: Two times a day (BID) | ORAL | 0 refills | Status: AC

## 2018-09-25 NOTE — ED Notes (Signed)
Stuck pt twice was unsuccessful

## 2018-09-25 NOTE — ED Notes (Signed)
Pt family given update.

## 2018-09-25 NOTE — ED Provider Notes (Signed)
[facility] EMERGENCY DEPARTMENT Provider Note   CSN: [id number] Arrival date & time: 09/25/18  1559    History   Chief Complaint Chief Complaint  Patient presents with   Weakness    HPI [name] is a 68 y.o. female.     The history is provided by the patient.  Constipation Severity:  Mild Timing:  Constant Progression:  Unchanged Chronicity:  Chronic Context: not dehydration   Stool description:  Unable to specify Relieved by:  Nothing Worsened by:  Narcotic pain medications Associated symptoms: abdominal pain and flatus   Associated symptoms: no anorexia, no back pain, no diarrhea, no dysuria, no fever, no nausea and no vomiting   Associated symptoms comment:  Has chronic foley   Past Medical History:  Diagnosis Date   Anemia    Arthritis    "all over" (04/13/2012)   Cancer of right breast ([place]) 2011   Cervical neuropathy    PERIPHERAL NEUROPATHY , HAD CERVICAL FUSION   Diabetes mellitus without complication ([place])    Difficult intubation    Fastrach #4 LMA then # 7 ETT used in 2006 [facility], cervical laminectomy)   GERD (gastroesophageal reflux disease)    History of kidney stones    Hypercholesteremia    Hypertension    Hypothyroidism    Kidney stones    Neurogenic bladder    Osteomyelitis (HCC)    Paraplegia (HCC)    Peripheral neuropathy    PONV (postoperative nausea and vomiting)    Sacral decubitus ulcer 10/2014   Sepsis due to urinary tract infection [facility])     Patient Active Problem List   Diagnosis Date Noted   Palliative care by specialist    Advanced care planning/counseling discussion    Spondylosis, cervical, with myelopathy    Acute respiratory failure with hypoxia and hypercapnia ([place])    Left perineal ischial pressure ulcer 09/16/2018   Bacteremia due to Klebsiella pneumoniae 09/16/2018   Sepsis due to urinary tract infection ([place]) 09/15/2018   Diabetes type 2, uncontrolled ([place])  04/26/2017   Urinary tract infection associated with cystostomy catheter ([place])    Medication monitoring encounter 12/03/2015   Hypertension    History of nephrolithiasis    AKI (acute kidney injury) ([place]) 08/10/2015   Sepsis ([place]) 08/10/2015   Hypotension 08/10/2015   Altered mental status 08/10/2015   Leukocytosis 08/10/2015   Kidney stones 08/10/2015   Acute kidney injury ([place])    Encephalopathy acute    Osteomyelitis ([place]) 11/06/2014   Sacral decubitus ulcer    Paraplegia ([place]) 11/01/2014   Sacral ulcer ([place]) 10/31/2014   Hypothyroidism 10/31/2014   Breast cancer of lower-inner quadrant of right female breast ([place]) 12/22/2012   HTN (hypertension) 04/15/2012   Left knee DJD 04/13/2012    Class: Chronic    Past Surgical History:  Procedure Laterality Date   ABDOMINAL HYSTERECTOMY  ~ [place] Right 2011   BREAST LUMPECTOMY Right 2011   CALDWELL LUC  ~ 2003   "benign tumor removed from up under gum" (04/13/2012)   DACROCYSTORHINOSTOMY  ~ 2000   "put stents in my tear ducts; both eyes" (04/13/2012)   EYE SURGERY  2004   STENTS TO BIL EYES   I&D EXTREMITY Bilateral 11/03/2014   Procedure: IRRIGATION AND DEBRIDEMENT BILATERAL HEEL;  Surgeon: Irene Limbo, MD;  Location: [facility];  Service: Plastics;  Laterality: Bilateral;   INCISION AND DRAINAGE OF WOUND Left 11/03/2014   Procedure:  IRRIGATION AND DEBRIDEMENT SACRAL ULCER;  Surgeon: Irene Limbo, MD;  Location: [facility];  Service: Plastics;  Laterality: Left;   JOINT REPLACEMENT     POSTERIOR CERVICAL LAMINECTOMY  2006   SKIN SPLIT GRAFT Left 08/05/2016   Procedure: SURGICAL PREP  FOR GRAFTING APPLICATION ACELL TO LEFT ISCHIUM;  Surgeon: Irene Limbo, MD;  Location: [facility];  Service: Plastics;  Laterality: Left;   TONSILLECTOMY AND ADENOIDECTOMY  ~ 1954   TOTAL KNEE ARTHROPLASTY WITH REVISION COMPONENTS  04/13/2012   Procedure: TOTAL KNEE ARTHROPLASTY WITH REVISION  COMPONENTS;  Surgeon: Hessie Dibble, MD;  Location: [facility];  Service: Orthopedics;  Laterality: Left;     OB History   No obstetric history on file.      Home Medications    Prior to Admission medications   Medication Sig Start Date End Date Taking? Authorizing Provider  ascorbic acid (VITAMIN C) 500 MG tablet Take 1 tablet (500 mg total) by mouth daily. 11/07/14   Francesca Oman, DO  baclofen (LIORESAL) 20 MG tablet Take 20 mg by mouth every 6 (six) hours as needed for muscle spasms.     [provider]  bisacodyl (DULCOLAX) 10 MG suppository Place 1 suppository (10 mg total) rectally daily as needed for severe constipation. 09/23/18   Aline August, MD  cephALEXin (KEFLEX) 500 MG capsule Take 1 capsule (500 mg total) by mouth 2 (two) times daily for 10 days. 09/25/18 10/05/18  Maricsa Sammons, DO  docusate sodium (COLACE) 100 MG capsule Take 100 mg by mouth 2 (two) times daily as needed (FOR CONSTIPATION).     [provider]  DULoxetine (CYMBALTA) 60 MG capsule Take 120 mg by mouth daily.     [provider]  ferrous sulfate 325 (65 FE) MG tablet Take 325 mg by mouth daily after supper.     [provider]  folic acid (FOLVITE) 1 MG tablet Take 1 mg by mouth daily.     [provider]  furosemide (LASIX) 40 MG tablet 80mg  in AM and 40mg  in PM till 09/26/18, then 80mg  in AM from 09/27/18 till 10/03/18, then 40mg  in AM from 10/04/18 onwards 09/23/18   Aline August, MD  irbesartan (AVAPRO) 75 MG tablet Take 2 tablets (150 mg total) by mouth daily. 09/24/18   Aline August, MD  levothyroxine (SYNTHROID, LEVOTHROID) 125 MCG tablet Take 1 tablet (125 mcg total) by mouth daily before breakfast. Patient taking differently: Take 125 mcg by mouth daily.  11/07/14   Francesca Oman, DO  metFORMIN (GLUCOPHAGE) 500 MG tablet Take 250 mg by mouth 2 (two) times daily with a meal.    [provider]  metoprolol (LOPRESSOR) 100 MG tablet Take 100 mg by mouth 2  (two) times daily.  10/28/14   [provider]  mometasone-formoterol (DULERA) 100-5 MCG/ACT AERO Inhale 2 puffs into the lungs 2 (two) times daily. 09/23/18   Aline August, MD  Multiple Vitamin (MULTIVITAMIN WITH MINERALS) TABS tablet Take 1 tablet by mouth daily. 11/07/14   Francesca Oman, DO  NIFEdipine (PROCARDIA XL/ADALAT-CC) 30 MG 24 hr tablet Take 30 mg by mouth daily after supper.     [provider]  oxyCODONE-acetaminophen (PERCOCET) 10-325 MG tablet Take 1 tablet by mouth every 6 (six) hours as needed for pain.     [provider]  polyethylene glycol (MIRALAX / GLYCOLAX) 17 g packet Take 17 g by mouth 2 (two) times daily. 09/23/18   Aline August, MD  pregabalin (  LYRICA) 75 MG capsule Take 75 mg by mouth 2 (two) times daily.  08/23/18   [provider]  senna-docusate (SENOKOT-S) 8.6-50 MG tablet Take 1 tablet by mouth 2 (two) times daily. 09/23/18   Aline August, MD  zinc sulfate 220 MG capsule Take 1 capsule (220 mg total) by mouth daily. 11/07/14   Francesca Oman, DO    Family History Family History  Problem Relation Age of Onset   Heart disease Father     Social History Social History   Tobacco Use   Smoking status: Former Smoker    Packs/day: 0.50    Years: 50.00    Pack years: 25.00    Types: Cigarettes    Quit date: 11/06/2014    Years since quitting: 3.8   Smokeless tobacco: Never Used  Substance Use Topics   Alcohol use: No   Drug use: No     Allergies   Ivp dye [iodinated diagnostic agents], Aspirin, and Sulfa antibiotics   Review of Systems Review of Systems  Constitutional: Negative for chills and fever.  HENT: Negative for ear pain and sore throat.   Eyes: Negative for pain and visual disturbance.  Respiratory: Negative for cough and shortness of breath.   Cardiovascular: Negative for chest pain and palpitations.  Gastrointestinal: Positive for abdominal pain, constipation and flatus. Negative for anorexia,  diarrhea, nausea and vomiting.  Genitourinary: Negative for dysuria and hematuria.  Musculoskeletal: Negative for arthralgias and back pain.  Skin: Negative for color change and rash.  Neurological: Negative for seizures and syncope.  All other systems reviewed and are negative.    Physical Exam Updated Vital Signs  ED Triage Vitals  Enc Vitals Group     BP 09/25/18 1606 132/68     Pulse Rate 09/25/18 1606 78     Resp 09/25/18 1606 16     Temp 09/25/18 1606 98 F (36.7 C)     Temp Source 09/25/18 1606 Oral     SpO2 09/25/18 1606 100 %     Weight 09/25/18 1607 180 lb (81.6 kg)     Height 09/25/18 1607 5\' 4"  (1.626 m)     Head Circumference --      Peak Flow --      Pain Score 09/25/18 1607 0     Pain Loc --      Pain Edu? --      Excl. in [place]? --     Physical Exam Vitals signs and nursing note reviewed.  Constitutional:      General: She is not in acute distress.    Appearance: She is well-developed. She is not ill-appearing.  HENT:     Head: Normocephalic and atraumatic.     Nose: Nose normal.     Mouth/Throat:     Mouth: Mucous membranes are moist.  Eyes:     Conjunctiva/sclera: Conjunctivae normal.     Pupils: Pupils are equal, round, and reactive to light.  Neck:     Musculoskeletal: Normal range of motion and neck supple.  Cardiovascular:     Rate and Rhythm: Normal rate and regular rhythm.     Heart sounds: No murmur.  Pulmonary:     Effort: Pulmonary effort is normal. No respiratory distress.     Breath sounds: Normal breath sounds.  Abdominal:     General: There is distension.     Palpations: Abdomen is soft.     Tenderness: There is no abdominal tenderness.  Skin:    General: Skin  is warm and dry.     Capillary Refill: Capillary refill takes less than 2 seconds.  Neurological:     General: No focal deficit present.     Mental Status: She is alert.  Psychiatric:        Mood and Affect: Mood normal.      ED Treatments / Results  Labs (all  labs ordered are listed, but only abnormal results are displayed) Labs Reviewed  CBC WITH DIFFERENTIAL/PLATELET - Abnormal; Notable for the following components:      Result Value   Abs Immature Granulocytes 0.10 (*)    All other components within normal limits  BASIC METABOLIC PANEL - Abnormal; Notable for the following components:   Chloride 97 (*)    Glucose, Bld 112 (*)    BUN 26 (*)    Creatinine, Ser 1.26 (*)    GFR calc non Af Amer 43 (*)    GFR calc Af Amer 50 (*)    All other components within normal limits  URINALYSIS, ROUTINE W REFLEX MICROSCOPIC - Abnormal; Notable for the following components:   APPearance HAZY (*)    Protein, ur 30 (*)    Nitrite POSITIVE (*)    Leukocytes,Ua SMALL (*)    Bacteria, UA MANY (*)    All other components within normal limits  URINE CULTURE    EKG None  Radiology Ct Abdomen Pelvis Wo Contrast  Result Date: 09/25/2018 CLINICAL DATA:  Abdominal distension EXAM: CT ABDOMEN AND PELVIS WITHOUT CONTRAST TECHNIQUE: Multidetector CT imaging of the abdomen and pelvis was performed following the standard protocol without IV contrast. COMPARISON:  CT 09/15/2018, 04/27/2017, radiograph 09/17/2018 FINDINGS: Lower chest: Lung bases demonstrate no acute consolidation or effusion. Heart size is stable. Hepatobiliary: Enlarged liver measuring 22 cm craniocaudad. Diffuse fatty infiltration. Fat sparing near the gallbladder fossa. No calcified gallstone or biliary dilatation Pancreas: Unremarkable. No pancreatic ductal dilatation or surrounding inflammatory changes. Spleen: Calcified granuloma Adrenals/Urinary Tract: Adrenal glands are within normal limits. Low-density cyst upper left kidney. Multiple punctate stones in the lower pole of the left kidney. Cortical scarring right kidney. Dominant stone lower pole right kidney measuring 12 mm. No hydronephrosis. No ureteral stone. Thick-walled urinary bladder with suprapubic catheter. Stomach/Bowel: Stomach is  nonenlarged. No dilated small bowel. No colon wall thickening. Large amount of stool in the colon. Negative appendix. Vascular/Lymphatic: Moderate aortic atherosclerosis. No aneurysmal dilatation. Prominent left inguinal nodes as before. Reproductive: Status post hysterectomy. No adnexal masses. Other: Negative for free air or free fluid. Musculoskeletal: Deep left decubitus ulcer with skin tract. Sclerosis and slightly irregular appearance of left ischium which may be secondary to chronic osteomyelitis. IMPRESSION: 1. No CT evidence for acute intra-abdominal or pelvic abnormality. Large volume of stool throughout the colon. 2. Enlarged fatty liver with fat sparing near the gallbladder fossa 3. Cortical scarring right kidney. Bilateral intrarenal stones without hydronephrosis. Thick-walled urinary bladder decompressed by suprapubic catheter 4. Deep left decubitus ulcer with underlying left ischial changes suggesting osteomyelitis. Electronically Signed   By: Donavan Foil M.D.   On: 09/25/2018 17:19    Procedures Procedures (including critical care time)  Medications Ordered in ED Medications  cephALEXin (KEFLEX) capsule 500 mg (has no administration in time range)  sodium chloride 0.9 % bolus 500 mL (has no administration in time range)     Initial Impression / Assessment and Plan / ED Course  I have reviewed the triage vital signs and the nursing notes.  Pertinent labs & imaging results that were  available during my care of the patient were reviewed by me and considered in my medical decision making (see chart for details).     [name] is a 68 year old female with history of quadriplegia with chronic Foley who presents to the ED with constipation.  Patient with unremarkable vitals.  On 1 L of oxygen which is her baseline.  Patient overall feels fine.  She does not know why she is here.  Patient tells me that she does not have any issues and her family wanted her to be seen because she  was a little bit confused at home.  She knows her name and where she is.  Family is concerned for possible UTI as she recently had urinary tract infection and admitted and treated with IV Rocephin.  Patient has some distention on abdominal exam but no specific tenderness.  Has history of severe constipation at baseline.  Patient overall chronically ill-appearing but appears stable at this time.  She has normal vitals.  No fever.  We will get basic labs, urinalysis.  Will get CT scan given abdominal distention.  Patient with possible UTI.  Positive nitrates and many bacteria.  However no white count.  Patient with mild elevation in creatinine from baseline.  Will give some hydration with IV fluids.  Otherwise no significant electrolyte abnormalities.  Patient with no fever.  No concern for sepsis.  CT scan of the abdomen just shows large stool burden which is chronic in nature.  Patient was discharged from the ED in good condition.  Family made aware of the plan.  Recommend close follow-up with primary care doctor.  Urine cultures in the past have been pansensitive to cephalosporins.  This chart was dictated using voice recognition software.  Despite best efforts to proofread,  errors can occur which can change the documentation meaning.    Final Clinical Impressions(s) / ED Diagnoses   Final diagnoses:  Lower urinary tract infectious disease    ED Discharge Orders         Ordered    cephALEXin (KEFLEX) 500 MG capsule  2 times daily     09/25/18 2019           Lennice Sites, DO 09/25/18 2020

## 2018-09-25 NOTE — Discharge Instructions (Addendum)
Follow-up with your primary doctor to recheck urinalysis.  Kidney function was a slightly elevated likely from dehydration.  Should have improvement with IV fluids given today.  Have doctor recheck your kidney function at visit this week. Return if symptoms worsen.

## 2018-09-25 NOTE — ED Notes (Signed)
Pt tearful and wishing to go home, pt c/o abd pain with nausea; explained we are waiting for lab work to result so we have better idea of whats causing her symptoms; pt agreed with this plan

## 2018-09-25 NOTE — ED Triage Notes (Addendum)
Per [facility], Pt from home with family. Pt is quadriplegic, has chronic foley. Pt seen 2 days ago for UTI given IV antibiotics. Pt's family reports intermittent confusion and no BM for 2-3 days. Pt tells EMS she doesn't feel well, A & O x4, some confusion en route. Family denies fever at home. Pt discharged on 2 L [place] 2 days ago, O2 sats 100%

## 2018-09-27 ENCOUNTER — Telehealth: Payer: Self-pay | Admitting: Cardiology

## 2018-09-27 NOTE — Telephone Encounter (Signed)
LVM for patient to call and schedule followup with Dr. Ellyn Hack or APP in 3 weeks.

## 2018-09-28 ENCOUNTER — Telehealth: Payer: Self-pay | Admitting: Cardiology

## 2018-09-28 NOTE — Telephone Encounter (Signed)
LVM for patient to call and schedule 3 week appt with APP.

## 2018-09-30 ENCOUNTER — Telehealth: Payer: Self-pay | Admitting: *Deleted

## 2018-09-30 DIAGNOSIS — I10 Essential (primary) hypertension: Secondary | ICD-10-CM | POA: Diagnosis not present

## 2018-09-30 DIAGNOSIS — K5900 Constipation, unspecified: Secondary | ICD-10-CM | POA: Diagnosis not present

## 2018-09-30 DIAGNOSIS — E119 Type 2 diabetes mellitus without complications: Secondary | ICD-10-CM | POA: Diagnosis not present

## 2018-09-30 DIAGNOSIS — E782 Mixed hyperlipidemia: Secondary | ICD-10-CM | POA: Diagnosis not present

## 2018-09-30 LAB — URINE CULTURE: Culture: >=100000 — AB

## 2018-09-30 NOTE — Telephone Encounter (Signed)
Received call from [facility], rep Glyn Ade and they are accepting [employer]. Contacted dtr, Gerhard Perches to make aware the referral given [facility] and they will call with appointments. Jonnie Finner RN [facility] Case Mgmt phone [phone]

## 2018-10-01 NOTE — Progress Notes (Signed)
ED Antimicrobial Stewardship Positive Culture Follow Up   [name] is an 68 y.o. female who presented to [facility] on 09/25/2018 with a chief complaint of  Chief Complaint  Patient presents with  . Weakness    Recent Results (from the past 720 hour(s))  Urine culture     Status: Abnormal   Collection Time: 09/15/18 11:53 AM   Specimen: Urine, Random  Result Value Ref Range Status   Specimen Description URINE, RANDOM  Final   Special Requests   Final    NONE Performed at [facility], [address], [place], [place] [address]    Culture (A)  Final    >=100,000 COLONIES/mL KLEBSIELLA PNEUMONIAE >=100,000 COLONIES/mL ESCHERICHIA COLI    Report Status 09/18/2018 FINAL  Final   Organism ID, Bacteria KLEBSIELLA PNEUMONIAE (A)  Final   Organism ID, Bacteria ESCHERICHIA COLI (A)  Final      Susceptibility   Escherichia coli - MIC*    AMPICILLIN <=2 SENSITIVE Sensitive     CEFAZOLIN <=4 SENSITIVE Sensitive     CEFTRIAXONE <=1 SENSITIVE Sensitive     CIPROFLOXACIN <=0.25 SENSITIVE Sensitive     GENTAMICIN <=1 SENSITIVE Sensitive     IMIPENEM <=0.25 SENSITIVE Sensitive     NITROFURANTOIN <=16 SENSITIVE Sensitive     TRIMETH/SULFA <=20 SENSITIVE Sensitive     AMPICILLIN/SULBACTAM <=2 SENSITIVE Sensitive     PIP/TAZO <=4 SENSITIVE Sensitive     Extended ESBL NEGATIVE Sensitive     * >=100,000 COLONIES/mL ESCHERICHIA COLI   Klebsiella pneumoniae - MIC*    AMPICILLIN >=32 RESISTANT Resistant     CEFAZOLIN <=4 SENSITIVE Sensitive     CEFTRIAXONE <=1 SENSITIVE Sensitive     CIPROFLOXACIN <=0.25 SENSITIVE Sensitive     GENTAMICIN <=1 SENSITIVE Sensitive     IMIPENEM <=0.25 SENSITIVE Sensitive     NITROFURANTOIN 128 RESISTANT Resistant     TRIMETH/SULFA <=20 SENSITIVE Sensitive     AMPICILLIN/SULBACTAM >=32 RESISTANT Resistant     PIP/TAZO <=4 SENSITIVE Sensitive     Extended ESBL NEGATIVE Sensitive     * >=100,000 COLONIES/mL KLEBSIELLA PNEUMONIAE  SARS Coronavirus 2  (CEPHEID- Performed in [facility] hospital lab), Hosp Order     Status: None   Collection Time: 09/15/18 11:54 AM   Specimen: Nasopharyngeal Swab  Result Value Ref Range Status   SARS Coronavirus 2 NEGATIVE NEGATIVE Final    Comment: (NOTE) If result is NEGATIVE SARS-CoV-2 target nucleic acids are NOT DETECTED. The SARS-CoV-2 RNA is generally detectable in upper and lower  respiratory specimens during the acute phase of infection. The lowest  concentration of SARS-CoV-2 viral copies this assay can detect is 250  copies / mL. A negative result does not preclude SARS-CoV-2 infection  and should not be used as the sole basis for treatment or other  patient management decisions.  A negative result may occur with  improper specimen collection / handling, submission of specimen other  than nasopharyngeal swab, presence of viral mutation(s) within the  areas targeted by this assay, and inadequate number of viral copies  (<250 copies / mL). A negative result must be combined with clinical  observations, patient history, and epidemiological information. If result is POSITIVE SARS-CoV-2 target nucleic acids are DETECTED. The SARS-CoV-2 RNA is generally detectable in upper and lower  respiratory specimens dur ing the acute phase of infection.  Positive  results are indicative of active infection with SARS-CoV-2.  Clinical  correlation with patient history and other diagnostic information  is  necessary to determine patient infection status.  Positive results do  not rule out bacterial infection or co-infection with other viruses. If result is PRESUMPTIVE POSTIVE SARS-CoV-2 nucleic acids MAY BE PRESENT.   A presumptive positive result was obtained on the submitted specimen  and confirmed on repeat testing.  While 2019 novel coronavirus  (SARS-CoV-2) nucleic acids may be present in the submitted sample  additional confirmatory testing may be necessary for epidemiological  and / or clinical  management purposes  to differentiate between  SARS-CoV-2 and other Sarbecovirus currently known to infect humans.  If clinically indicated additional testing with an alternate test  methodology [phone]) is advised. The SARS-CoV-2 RNA is generally  detectable in upper and lower respiratory sp ecimens during the acute  phase of infection. The expected result is Negative. Fact Sheet for Patients:  StrictlyIdeas.no Fact Sheet for Healthcare Providers: BankingDealers.co.za This test is not yet approved or cleared by the [place] FDA and has been authorized for detection and/or diagnosis of SARS-CoV-2 by FDA under an Emergency Use Authorization (EUA).  This EUA will remain in effect (meaning this test can be used) for the duration of the COVID-19 declaration under Section 564(b)(1) of the Act, 21 U.S.C. section 360bbb-3(b)(1), unless the authorization is terminated or revoked sooner. Performed at [facility], [place] [address] [place], [place] [address]   Blood Culture (routine x 2)     Status: Abnormal   Collection Time: 09/15/18 11:58 AM   Specimen: BLOOD  Result Value Ref Range Status   Specimen Description BLOOD BLOOD RIGHT FOREARM  Final   Special Requests   Final    BOTTLES DRAWN AEROBIC AND ANAEROBIC Blood Culture adequate volume   Culture  Setup Time   Final    GRAM NEGATIVE RODS AEROBIC BOTTLE ONLY CRITICAL VALUE NOTED.  VALUE IS CONSISTENT WITH PREVIOUSLY REPORTED AND CALLED VALUE.    Culture (A)  Final    KLEBSIELLA PNEUMONIAE SUSCEPTIBILITIES PERFORMED ON PREVIOUS CULTURE WITHIN THE LAST 5 DAYS. Performed at [facility], [place] [address], [place], [place] [address]    Report Status 09/18/2018 FINAL  Final  Blood Culture (routine x 2)     Status: Abnormal   Collection Time: 09/15/18 12:36 PM   Specimen: BLOOD  Result Value Ref Range Status   Specimen Description BLOOD SITE NOT SPECIFIED  Final   Special  Requests   Final    BOTTLES DRAWN AEROBIC AND ANAEROBIC Blood Culture results may not be optimal due to an inadequate volume of blood received in culture bottles   Culture  Setup Time   Final    IN BOTH AEROBIC AND ANAEROBIC BOTTLES Organism ID to follow GRAM NEGATIVE RODS CRITICAL RESULT CALLED TO, READ BACK BY AND VERIFIED WITH: MCCARTHY PHARMD AT 0800 ON [id number] BY SJW Performed at [facility], [place] [address] [place], [place] [address]    Culture KLEBSIELLA PNEUMONIAE (A)  Final   Report Status 09/18/2018 FINAL  Final   Organism ID, Bacteria KLEBSIELLA PNEUMONIAE  Final      Susceptibility   Klebsiella pneumoniae - MIC*    AMPICILLIN >=32 RESISTANT Resistant     CEFAZOLIN <=4 SENSITIVE Sensitive     CEFEPIME <=1 SENSITIVE Sensitive     CEFTAZIDIME <=1 SENSITIVE Sensitive     CEFTRIAXONE <=1 SENSITIVE Sensitive     CIPROFLOXACIN <=0.25 SENSITIVE Sensitive     GENTAMICIN <=1 SENSITIVE Sensitive     IMIPENEM 0.5 SENSITIVE Sensitive  TRIMETH/SULFA <=20 SENSITIVE Sensitive     AMPICILLIN/SULBACTAM >=32 RESISTANT Resistant     PIP/TAZO 8 SENSITIVE Sensitive     Extended ESBL NEGATIVE Sensitive     * KLEBSIELLA PNEUMONIAE  Blood Culture ID Panel (Reflexed)     Status: Abnormal   Collection Time: 09/15/18 12:36 PM  Result Value Ref Range Status   Enterococcus species NOT DETECTED NOT DETECTED Final   Listeria monocytogenes NOT DETECTED NOT DETECTED Final   Staphylococcus species NOT DETECTED NOT DETECTED Final   Staphylococcus aureus (BCID) NOT DETECTED NOT DETECTED Final   Streptococcus species NOT DETECTED NOT DETECTED Final   Streptococcus agalactiae NOT DETECTED NOT DETECTED Final   Streptococcus pneumoniae NOT DETECTED NOT DETECTED Final   Streptococcus pyogenes NOT DETECTED NOT DETECTED Final   Acinetobacter baumannii NOT DETECTED NOT DETECTED Final   Enterobacteriaceae species DETECTED (A) NOT DETECTED Final    Comment: Enterobacteriaceae represent a large family  of gram-negative bacteria, not a single organism. CRITICAL RESULT CALLED TO, READ BACK BY AND VERIFIED WITH: MCCARTHY PHARMD AT 0800 ON [id number] BY SJW    Enterobacter cloacae complex NOT DETECTED NOT DETECTED Final   Escherichia coli NOT DETECTED NOT DETECTED Final   Klebsiella oxytoca NOT DETECTED NOT DETECTED Final   Klebsiella pneumoniae DETECTED (A) NOT DETECTED Final    Comment: CRITICAL RESULT CALLED TO, READ BACK BY AND VERIFIED WITH: MCCARTHY PHARMD AT 0800 ON [id number] BY SJW    Proteus species NOT DETECTED NOT DETECTED Final   Serratia marcescens NOT DETECTED NOT DETECTED Final   Carbapenem resistance NOT DETECTED NOT DETECTED Final   Haemophilus influenzae NOT DETECTED NOT DETECTED Final   Neisseria meningitidis NOT DETECTED NOT DETECTED Final   Pseudomonas aeruginosa NOT DETECTED NOT DETECTED Final   Candida albicans NOT DETECTED NOT DETECTED Final   Candida glabrata NOT DETECTED NOT DETECTED Final   Candida krusei NOT DETECTED NOT DETECTED Final   Candida parapsilosis NOT DETECTED NOT DETECTED Final   Candida tropicalis NOT DETECTED NOT DETECTED Final    Comment: Performed at [facility], [address], [place], [place] [address]  MRSA PCR Screening     Status: Abnormal   Collection Time: 09/16/18  3:37 AM   Specimen: Nasopharyngeal  Result Value Ref Range Status   MRSA by PCR POSITIVE (A) NEGATIVE Final    Comment:        The GeneXpert MRSA Assay (FDA approved for NASAL specimens only), is one component of a comprehensive MRSA colonization surveillance program. It is not intended to diagnose MRSA infection nor to guide or monitor treatment for MRSA infections. RESULT CALLED TO, READ BACK BY AND VERIFIED WITH: LOFTIN,Q RN 09/16/2018 AT 5053 SKEEN,P Performed at [facility], [place] [address] [place], [place] [address]   Urine culture     Status: Abnormal   Collection Time: 09/25/18  4:34 PM   Specimen: Urine, Random  Result Value Ref Range Status    Specimen Description URINE, RANDOM  Final   Special Requests NONE  Final   Culture (A)  Final    >=100,000 COLONIES/mL PSEUDOMONAS AERUGINOSA 50,000 COLONIES/mL ENTEROCOCCUS FAECALIS    Report Status 09/30/2018 FINAL  Final   Organism ID, Bacteria PSEUDOMONAS AERUGINOSA (A)  Final   Organism ID, Bacteria ENTEROCOCCUS FAECALIS (A)  Final      Susceptibility   Enterococcus faecalis - MIC*    AMPICILLIN <=2 SENSITIVE Sensitive     LEVOFLOXACIN >=8 RESISTANT Resistant     NITROFURANTOIN <=16  SENSITIVE Sensitive     VANCOMYCIN 1 SENSITIVE Sensitive     * 50,000 COLONIES/mL ENTEROCOCCUS FAECALIS   Pseudomonas aeruginosa - MIC*    CEFTAZIDIME 4 SENSITIVE Sensitive     CIPROFLOXACIN <=0.25 SENSITIVE Sensitive     GENTAMICIN 2 SENSITIVE Sensitive     IMIPENEM 0.5 SENSITIVE Sensitive     PIP/TAZO 8 SENSITIVE Sensitive     CEFEPIME 2 SENSITIVE Sensitive     * >=100,000 COLONIES/mL PSEUDOMONAS AERUGINOSA    [x]  Treated with cephalexin, organism resistant to prescribed antimicrobial []  Patient discharged originally without antimicrobial agent and treatment is now indicated  New antibiotic prescription: Symptom check. If pt is symptomatic, DC cephalexin and start ciprofloxacin 250mg  PO BID x 3 days AND amoxicillin 875mg  PO BID x 5 days  ED Provider: Eliezer Mccoy, PA   Shuntel Fishburn, Rande Lawman 10/01/2018, 8:20 AM Clinical Pharmacist Monday - Friday phone -  [phone] Saturday - Sunday phone - [phone]

## 2018-10-02 DIAGNOSIS — Z9181 History of falling: Secondary | ICD-10-CM | POA: Diagnosis not present

## 2018-10-02 DIAGNOSIS — E7800 Pure hypercholesterolemia, unspecified: Secondary | ICD-10-CM | POA: Diagnosis not present

## 2018-10-02 DIAGNOSIS — Z87440 Personal history of urinary (tract) infections: Secondary | ICD-10-CM | POA: Diagnosis not present

## 2018-10-02 DIAGNOSIS — I509 Heart failure, unspecified: Secondary | ICD-10-CM | POA: Diagnosis not present

## 2018-10-02 DIAGNOSIS — Z96652 Presence of left artificial knee joint: Secondary | ICD-10-CM | POA: Diagnosis not present

## 2018-10-02 DIAGNOSIS — I959 Hypotension, unspecified: Secondary | ICD-10-CM | POA: Diagnosis not present

## 2018-10-02 DIAGNOSIS — Z7984 Long term (current) use of oral hypoglycemic drugs: Secondary | ICD-10-CM | POA: Diagnosis not present

## 2018-10-02 DIAGNOSIS — N319 Neuromuscular dysfunction of bladder, unspecified: Secondary | ICD-10-CM | POA: Diagnosis not present

## 2018-10-02 DIAGNOSIS — K5900 Constipation, unspecified: Secondary | ICD-10-CM | POA: Diagnosis not present

## 2018-10-02 DIAGNOSIS — M4712 Other spondylosis with myelopathy, cervical region: Secondary | ICD-10-CM | POA: Diagnosis not present

## 2018-10-02 DIAGNOSIS — D649 Anemia, unspecified: Secondary | ICD-10-CM | POA: Diagnosis not present

## 2018-10-02 DIAGNOSIS — Z9350 Unspecified cystostomy status: Secondary | ICD-10-CM | POA: Diagnosis not present

## 2018-10-02 DIAGNOSIS — I110 Hypertensive heart disease with heart failure: Secondary | ICD-10-CM | POA: Diagnosis not present

## 2018-10-02 DIAGNOSIS — Z87891 Personal history of nicotine dependence: Secondary | ICD-10-CM | POA: Diagnosis not present

## 2018-10-02 DIAGNOSIS — K219 Gastro-esophageal reflux disease without esophagitis: Secondary | ICD-10-CM | POA: Diagnosis not present

## 2018-10-02 DIAGNOSIS — G8250 Quadriplegia, unspecified: Secondary | ICD-10-CM | POA: Diagnosis not present

## 2018-10-02 DIAGNOSIS — E1142 Type 2 diabetes mellitus with diabetic polyneuropathy: Secondary | ICD-10-CM | POA: Diagnosis not present

## 2018-10-02 DIAGNOSIS — M159 Polyosteoarthritis, unspecified: Secondary | ICD-10-CM | POA: Diagnosis not present

## 2018-10-02 DIAGNOSIS — E039 Hypothyroidism, unspecified: Secondary | ICD-10-CM | POA: Diagnosis not present

## 2018-10-02 DIAGNOSIS — Z853 Personal history of malignant neoplasm of breast: Secondary | ICD-10-CM | POA: Diagnosis not present

## 2018-10-07 DIAGNOSIS — I110 Hypertensive heart disease with heart failure: Secondary | ICD-10-CM | POA: Diagnosis not present

## 2018-10-07 DIAGNOSIS — E039 Hypothyroidism, unspecified: Secondary | ICD-10-CM | POA: Diagnosis not present

## 2018-10-07 DIAGNOSIS — D649 Anemia, unspecified: Secondary | ICD-10-CM | POA: Diagnosis not present

## 2018-10-07 DIAGNOSIS — Z96652 Presence of left artificial knee joint: Secondary | ICD-10-CM | POA: Diagnosis not present

## 2018-10-07 DIAGNOSIS — E7800 Pure hypercholesterolemia, unspecified: Secondary | ICD-10-CM | POA: Diagnosis not present

## 2018-10-07 DIAGNOSIS — Z853 Personal history of malignant neoplasm of breast: Secondary | ICD-10-CM | POA: Diagnosis not present

## 2018-10-07 DIAGNOSIS — Z87440 Personal history of urinary (tract) infections: Secondary | ICD-10-CM | POA: Diagnosis not present

## 2018-10-07 DIAGNOSIS — Z7984 Long term (current) use of oral hypoglycemic drugs: Secondary | ICD-10-CM | POA: Diagnosis not present

## 2018-10-07 DIAGNOSIS — Z87891 Personal history of nicotine dependence: Secondary | ICD-10-CM | POA: Diagnosis not present

## 2018-10-07 DIAGNOSIS — I959 Hypotension, unspecified: Secondary | ICD-10-CM | POA: Diagnosis not present

## 2018-10-07 DIAGNOSIS — K219 Gastro-esophageal reflux disease without esophagitis: Secondary | ICD-10-CM | POA: Diagnosis not present

## 2018-10-07 DIAGNOSIS — Z9350 Unspecified cystostomy status: Secondary | ICD-10-CM | POA: Diagnosis not present

## 2018-10-07 DIAGNOSIS — M4712 Other spondylosis with myelopathy, cervical region: Secondary | ICD-10-CM | POA: Diagnosis not present

## 2018-10-07 DIAGNOSIS — I509 Heart failure, unspecified: Secondary | ICD-10-CM | POA: Diagnosis not present

## 2018-10-07 DIAGNOSIS — Z9181 History of falling: Secondary | ICD-10-CM | POA: Diagnosis not present

## 2018-10-07 DIAGNOSIS — E1142 Type 2 diabetes mellitus with diabetic polyneuropathy: Secondary | ICD-10-CM | POA: Diagnosis not present

## 2018-10-07 DIAGNOSIS — N319 Neuromuscular dysfunction of bladder, unspecified: Secondary | ICD-10-CM | POA: Diagnosis not present

## 2018-10-07 DIAGNOSIS — M159 Polyosteoarthritis, unspecified: Secondary | ICD-10-CM | POA: Diagnosis not present

## 2018-10-07 DIAGNOSIS — G8250 Quadriplegia, unspecified: Secondary | ICD-10-CM | POA: Diagnosis not present

## 2018-10-07 DIAGNOSIS — K5900 Constipation, unspecified: Secondary | ICD-10-CM | POA: Diagnosis not present

## 2018-10-12 DIAGNOSIS — G894 Chronic pain syndrome: Secondary | ICD-10-CM | POA: Diagnosis not present

## 2018-10-12 DIAGNOSIS — M47816 Spondylosis without myelopathy or radiculopathy, lumbar region: Secondary | ICD-10-CM | POA: Diagnosis not present

## 2018-10-12 DIAGNOSIS — M4712 Other spondylosis with myelopathy, cervical region: Secondary | ICD-10-CM | POA: Diagnosis not present

## 2018-10-12 DIAGNOSIS — F329 Major depressive disorder, single episode, unspecified: Secondary | ICD-10-CM | POA: Diagnosis not present

## 2018-10-21 DIAGNOSIS — E119 Type 2 diabetes mellitus without complications: Secondary | ICD-10-CM | POA: Diagnosis not present

## 2018-10-21 DIAGNOSIS — E782 Mixed hyperlipidemia: Secondary | ICD-10-CM | POA: Diagnosis not present

## 2018-10-21 DIAGNOSIS — E559 Vitamin D deficiency, unspecified: Secondary | ICD-10-CM | POA: Diagnosis not present

## 2018-10-21 DIAGNOSIS — I10 Essential (primary) hypertension: Secondary | ICD-10-CM | POA: Diagnosis not present

## 2018-10-26 ENCOUNTER — Telehealth: Payer: PPO | Admitting: Cardiovascular Disease

## 2018-11-01 DIAGNOSIS — R339 Retention of urine, unspecified: Secondary | ICD-10-CM | POA: Diagnosis not present

## 2018-11-01 DIAGNOSIS — Z87440 Personal history of urinary (tract) infections: Secondary | ICD-10-CM | POA: Diagnosis not present

## 2018-11-01 DIAGNOSIS — S31821A Laceration without foreign body of left buttock, initial encounter: Secondary | ICD-10-CM | POA: Diagnosis not present

## 2018-11-01 DIAGNOSIS — L89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, stage 4: Secondary | ICD-10-CM | POA: Diagnosis not present

## 2018-11-01 DIAGNOSIS — G8220 Paraplegia, unspecified: Secondary | ICD-10-CM | POA: Diagnosis not present

## 2018-11-08 DIAGNOSIS — E7800 Pure hypercholesterolemia, unspecified: Secondary | ICD-10-CM | POA: Diagnosis not present

## 2018-11-08 DIAGNOSIS — I509 Heart failure, unspecified: Secondary | ICD-10-CM | POA: Diagnosis not present

## 2018-11-08 DIAGNOSIS — Z7984 Long term (current) use of oral hypoglycemic drugs: Secondary | ICD-10-CM | POA: Diagnosis not present

## 2018-11-08 DIAGNOSIS — M159 Polyosteoarthritis, unspecified: Secondary | ICD-10-CM | POA: Diagnosis not present

## 2018-11-08 DIAGNOSIS — N319 Neuromuscular dysfunction of bladder, unspecified: Secondary | ICD-10-CM | POA: Diagnosis not present

## 2018-11-08 DIAGNOSIS — Z9181 History of falling: Secondary | ICD-10-CM | POA: Diagnosis not present

## 2018-11-08 DIAGNOSIS — E039 Hypothyroidism, unspecified: Secondary | ICD-10-CM | POA: Diagnosis not present

## 2018-11-08 DIAGNOSIS — Z87440 Personal history of urinary (tract) infections: Secondary | ICD-10-CM | POA: Diagnosis not present

## 2018-11-08 DIAGNOSIS — K5900 Constipation, unspecified: Secondary | ICD-10-CM | POA: Diagnosis not present

## 2018-11-08 DIAGNOSIS — Z87891 Personal history of nicotine dependence: Secondary | ICD-10-CM | POA: Diagnosis not present

## 2018-11-08 DIAGNOSIS — Z9350 Unspecified cystostomy status: Secondary | ICD-10-CM | POA: Diagnosis not present

## 2018-11-08 DIAGNOSIS — Z96652 Presence of left artificial knee joint: Secondary | ICD-10-CM | POA: Diagnosis not present

## 2018-11-08 DIAGNOSIS — K219 Gastro-esophageal reflux disease without esophagitis: Secondary | ICD-10-CM | POA: Diagnosis not present

## 2018-11-08 DIAGNOSIS — E1142 Type 2 diabetes mellitus with diabetic polyneuropathy: Secondary | ICD-10-CM | POA: Diagnosis not present

## 2018-11-08 DIAGNOSIS — I110 Hypertensive heart disease with heart failure: Secondary | ICD-10-CM | POA: Diagnosis not present

## 2018-11-08 DIAGNOSIS — Z853 Personal history of malignant neoplasm of breast: Secondary | ICD-10-CM | POA: Diagnosis not present

## 2018-11-08 DIAGNOSIS — G8250 Quadriplegia, unspecified: Secondary | ICD-10-CM | POA: Diagnosis not present

## 2018-11-08 DIAGNOSIS — M4712 Other spondylosis with myelopathy, cervical region: Secondary | ICD-10-CM | POA: Diagnosis not present

## 2018-11-08 DIAGNOSIS — D649 Anemia, unspecified: Secondary | ICD-10-CM | POA: Diagnosis not present

## 2018-11-08 DIAGNOSIS — I959 Hypotension, unspecified: Secondary | ICD-10-CM | POA: Diagnosis not present

## 2018-11-09 DIAGNOSIS — G894 Chronic pain syndrome: Secondary | ICD-10-CM | POA: Diagnosis not present

## 2018-11-09 DIAGNOSIS — F329 Major depressive disorder, single episode, unspecified: Secondary | ICD-10-CM | POA: Diagnosis not present

## 2018-11-09 DIAGNOSIS — M4712 Other spondylosis with myelopathy, cervical region: Secondary | ICD-10-CM | POA: Diagnosis not present

## 2018-11-09 DIAGNOSIS — M47816 Spondylosis without myelopathy or radiculopathy, lumbar region: Secondary | ICD-10-CM | POA: Diagnosis not present

## 2018-11-16 DIAGNOSIS — I5033 Acute on chronic diastolic (congestive) heart failure: Secondary | ICD-10-CM | POA: Diagnosis not present

## 2018-11-22 DIAGNOSIS — Z87891 Personal history of nicotine dependence: Secondary | ICD-10-CM | POA: Diagnosis not present

## 2018-11-22 DIAGNOSIS — Z7984 Long term (current) use of oral hypoglycemic drugs: Secondary | ICD-10-CM | POA: Diagnosis not present

## 2018-11-22 DIAGNOSIS — K219 Gastro-esophageal reflux disease without esophagitis: Secondary | ICD-10-CM | POA: Diagnosis not present

## 2018-11-22 DIAGNOSIS — G8250 Quadriplegia, unspecified: Secondary | ICD-10-CM | POA: Diagnosis not present

## 2018-11-22 DIAGNOSIS — E039 Hypothyroidism, unspecified: Secondary | ICD-10-CM | POA: Diagnosis not present

## 2018-11-22 DIAGNOSIS — Z9350 Unspecified cystostomy status: Secondary | ICD-10-CM | POA: Diagnosis not present

## 2018-11-22 DIAGNOSIS — Z96652 Presence of left artificial knee joint: Secondary | ICD-10-CM | POA: Diagnosis not present

## 2018-11-22 DIAGNOSIS — Z87440 Personal history of urinary (tract) infections: Secondary | ICD-10-CM | POA: Diagnosis not present

## 2018-11-22 DIAGNOSIS — M159 Polyosteoarthritis, unspecified: Secondary | ICD-10-CM | POA: Diagnosis not present

## 2018-11-22 DIAGNOSIS — K5900 Constipation, unspecified: Secondary | ICD-10-CM | POA: Diagnosis not present

## 2018-11-22 DIAGNOSIS — N319 Neuromuscular dysfunction of bladder, unspecified: Secondary | ICD-10-CM | POA: Diagnosis not present

## 2018-11-22 DIAGNOSIS — I959 Hypotension, unspecified: Secondary | ICD-10-CM | POA: Diagnosis not present

## 2018-11-22 DIAGNOSIS — I110 Hypertensive heart disease with heart failure: Secondary | ICD-10-CM | POA: Diagnosis not present

## 2018-11-22 DIAGNOSIS — D649 Anemia, unspecified: Secondary | ICD-10-CM | POA: Diagnosis not present

## 2018-11-22 DIAGNOSIS — Z9181 History of falling: Secondary | ICD-10-CM | POA: Diagnosis not present

## 2018-11-22 DIAGNOSIS — Z853 Personal history of malignant neoplasm of breast: Secondary | ICD-10-CM | POA: Diagnosis not present

## 2018-11-22 DIAGNOSIS — M4712 Other spondylosis with myelopathy, cervical region: Secondary | ICD-10-CM | POA: Diagnosis not present

## 2018-11-22 DIAGNOSIS — I509 Heart failure, unspecified: Secondary | ICD-10-CM | POA: Diagnosis not present

## 2018-11-22 DIAGNOSIS — E1142 Type 2 diabetes mellitus with diabetic polyneuropathy: Secondary | ICD-10-CM | POA: Diagnosis not present

## 2018-11-22 DIAGNOSIS — E7800 Pure hypercholesterolemia, unspecified: Secondary | ICD-10-CM | POA: Diagnosis not present

## 2018-11-30 DIAGNOSIS — R339 Retention of urine, unspecified: Secondary | ICD-10-CM | POA: Diagnosis not present

## 2018-12-01 DIAGNOSIS — I10 Essential (primary) hypertension: Secondary | ICD-10-CM | POA: Diagnosis not present

## 2018-12-01 DIAGNOSIS — F29 Unspecified psychosis not due to a substance or known physiological condition: Secondary | ICD-10-CM | POA: Diagnosis not present

## 2018-12-01 DIAGNOSIS — R531 Weakness: Secondary | ICD-10-CM | POA: Diagnosis not present

## 2018-12-02 ENCOUNTER — Emergency Department (HOSPITAL_COMMUNITY)
Admission: EM | Admit: 2018-12-02 | Discharge: 2018-12-02 | Disposition: A | Discharge status: Home / Self Care | Payer: PPO | Attending: Emergency Medicine | Admitting: Emergency Medicine

## 2018-12-02 ENCOUNTER — Other Ambulatory Visit: Payer: Self-pay

## 2018-12-02 DIAGNOSIS — Z79899 Other long term (current) drug therapy: Secondary | ICD-10-CM | POA: Insufficient documentation

## 2018-12-02 DIAGNOSIS — F329 Major depressive disorder, single episode, unspecified: Secondary | ICD-10-CM | POA: Insufficient documentation

## 2018-12-02 DIAGNOSIS — I10 Essential (primary) hypertension: Secondary | ICD-10-CM | POA: Diagnosis not present

## 2018-12-02 DIAGNOSIS — Z7984 Long term (current) use of oral hypoglycemic drugs: Secondary | ICD-10-CM | POA: Diagnosis not present

## 2018-12-02 DIAGNOSIS — N3001 Acute cystitis with hematuria: Secondary | ICD-10-CM

## 2018-12-02 DIAGNOSIS — E039 Hypothyroidism, unspecified: Secondary | ICD-10-CM | POA: Diagnosis not present

## 2018-12-02 DIAGNOSIS — R45851 Suicidal ideations: Secondary | ICD-10-CM | POA: Insufficient documentation

## 2018-12-02 DIAGNOSIS — Z87891 Personal history of nicotine dependence: Secondary | ICD-10-CM | POA: Diagnosis not present

## 2018-12-02 DIAGNOSIS — Z7401 Bed confinement status: Secondary | ICD-10-CM | POA: Diagnosis not present

## 2018-12-02 DIAGNOSIS — E119 Type 2 diabetes mellitus without complications: Secondary | ICD-10-CM | POA: Diagnosis not present

## 2018-12-02 DIAGNOSIS — F4329 Adjustment disorder with other symptoms: Secondary | ICD-10-CM | POA: Diagnosis present

## 2018-12-02 DIAGNOSIS — M2550 Pain in unspecified joint: Secondary | ICD-10-CM | POA: Diagnosis not present

## 2018-12-02 LAB — COMPREHENSIVE METABOLIC PANEL
ALT: 30 U/L (ref 0–44)
AST: 32 U/L (ref 15–41)
Albumin: 3.8 g/dL (ref 3.5–5.0)
Alkaline Phosphatase: 52 U/L (ref 38–126)
Anion gap: 11 (ref 5–15)
BUN: 19 mg/dL (ref 8–23)
CO2: 26 mmol/L (ref 22–32)
Calcium: 9.7 mg/dL (ref 8.9–10.3)
Chloride: 102 mmol/L (ref 98–111)
Creatinine, Ser: 1.02 mg/dL — ABNORMAL HIGH (ref 0.44–1.00)
GFR calc Af Amer: >60 mL/min (ref >60)
GFR calc non Af Amer: 56 mL/min — ABNORMAL LOW (ref >60)
Glucose, Bld: 166 mg/dL — ABNORMAL HIGH (ref 70–99)
Potassium: 4.4 mmol/L (ref 3.5–5.1)
Sodium: 139 mmol/L (ref 135–145)
Total Bilirubin: 0.5 mg/dL (ref 0.3–1.2)
Total Protein: 8 g/dL (ref 6.5–8.1)

## 2018-12-02 LAB — CBC
HCT: 42.2 % (ref 36.0–46.0)
Hemoglobin: 13.4 g/dL (ref 12.0–15.0)
MCH: 31.2 pg (ref 26.0–34.0)
MCHC: 31.8 g/dL (ref 30.0–36.0)
MCV: 98.1 fL (ref 80.0–100.0)
Platelets: 263 10*3/uL (ref 150–400)
RBC: 4.3 MIL/uL (ref 3.87–5.11)
RDW: 14 % (ref 11.5–15.5)
WBC: 5 10*3/uL (ref 4.0–10.5)
nRBC: 0 % (ref 0.0–0.2)

## 2018-12-02 LAB — URINALYSIS, ROUTINE W REFLEX MICROSCOPIC
Bilirubin Urine: NEGATIVE
Glucose, UA: NEGATIVE mg/dL
Hgb urine dipstick: NEGATIVE
Ketones, ur: NEGATIVE mg/dL
Nitrite: POSITIVE — AB
Protein, ur: 100 mg/dL — AB
Specific Gravity, Urine: 1.015 (ref 1.005–1.030)
WBC, UA: >50 WBC/hpf — ABNORMAL HIGH (ref 0–5)
pH: 6 (ref 5.0–8.0)

## 2018-12-02 LAB — RAPID URINE DRUG SCREEN, HOSP PERFORMED
Amphetamines: NOT DETECTED
Barbiturates: NOT DETECTED
Benzodiazepines: NOT DETECTED
Cocaine: NOT DETECTED
Opiates: POSITIVE — AB
Tetrahydrocannabinol: NOT DETECTED

## 2018-12-02 LAB — ACETAMINOPHEN LEVEL: Acetaminophen (Tylenol), Serum: <10 ug/mL — ABNORMAL LOW (ref 10–30)

## 2018-12-02 LAB — ETHANOL: Alcohol, Ethyl (B): <10 mg/dL (ref <10)

## 2018-12-02 LAB — SALICYLATE LEVEL: Salicylate Lvl: <7 mg/dL (ref 2.8–30.0)

## 2018-12-02 LAB — CBG MONITORING, ED: Glucose-Capillary: 184 mg/dL — ABNORMAL HIGH (ref 70–99)

## 2018-12-02 MED ORDER — CEPHALEXIN 500 MG PO CAPS: 500.0000 mg | ORAL_CAPSULE | Freq: Three times a day (TID) | ORAL | 0 refills | Status: AC

## 2018-12-02 MED ORDER — METOPROLOL TARTRATE 25 MG PO TABS
100.0000 mg | ORAL_TABLET | Freq: Once | ORAL | Status: AC
  Administered 2018-12-02: 100 mg via ORAL
  Filled 2018-12-02: qty 4

## 2018-12-02 MED ORDER — BACLOFEN 10 MG PO TABS
20.0000 mg | ORAL_TABLET | Freq: Once | ORAL | Status: AC
  Administered 2018-12-02: 14:00:00 20 mg via ORAL
  Filled 2018-12-02: qty 2

## 2018-12-02 MED ORDER — PREGABALIN 50 MG PO CAPS
75.0000 mg | ORAL_CAPSULE | Freq: Once | ORAL | Status: AC
  Administered 2018-12-02: 75 mg via ORAL
  Filled 2018-12-02: qty 1

## 2018-12-02 NOTE — ED Provider Notes (Signed)
[facility] DEPT Provider Note   CSN: [id number] Arrival date & time: 12/02/18  0008     History   Chief Complaint Chief Complaint  Patient presents with  . Suicidal    HPI [name] is a 68 y.o. female.     Patient brought to the emergency department by EMS from home after became concerned about possible suicide attempt.  Patient reports that she had a disagreement with her husband earlier.  She reports that it was a "heated argument".  She says that during the argument, her husband, who is her primary caregiver, told her that he he wished she was dead.  Patient reports that she then stated give me my medicine and I will take care of that.  She states that she dropped all the 2 tablets on the ground, husband reported that she put them on her mouth and they had to be taken out of her mouth by him.  Patient states that she realizes that this was just a heated argument and a mistake.  She states that she never had any intention of harming herself and currently has no wishes to kill herself or harm anyone else.     Past Medical History:  Diagnosis Date  . Anemia   . Arthritis    "all over" (04/13/2012)  . Cancer of right breast ([facility]) 2011  . Cervical neuropathy    PERIPHERAL NEUROPATHY , HAD CERVICAL FUSION  . Diabetes mellitus without complication ([facility])   . Difficult intubation    Fastrach #4 LMA then # 7 ETT used in 2006 [facility], cervical laminectomy)  . GERD (gastroesophageal reflux disease)   . History of kidney stones   . Hypercholesteremia   . Hypertension   . Hypothyroidism   . Kidney stones   . Neurogenic bladder   . Osteomyelitis ([facility])   . Paraplegia ([facility])   . Peripheral neuropathy   . PONV (postoperative nausea and vomiting)   . Sacral decubitus ulcer 10/2014  . Sepsis due to urinary tract infection [facility])     Patient Active Problem List   Diagnosis Date Noted  . Palliative care by specialist   . Advanced care  planning/counseling discussion   . Spondylosis, cervical, with myelopathy   . Acute respiratory failure with hypoxia and hypercapnia (HCC)   . Left perineal ischial pressure ulcer 09/16/2018  . Bacteremia due to Klebsiella pneumoniae 09/16/2018  . Sepsis due to urinary tract infection ([facility]) 09/15/2018  . Diabetes type 2, uncontrolled ([facility]) 04/26/2017  . Urinary tract infection associated with cystostomy catheter ([facility])   . Medication monitoring encounter 12/03/2015  . Hypertension   . History of nephrolithiasis   . AKI (acute kidney injury) ([facility]) 08/10/2015  . Sepsis ([facility]) 08/10/2015  . Hypotension 08/10/2015  . Altered mental status 08/10/2015  . Leukocytosis 08/10/2015  . Kidney stones 08/10/2015  . Acute kidney injury ([facility])   . Encephalopathy acute   . Osteomyelitis ([facility]) 11/06/2014  . Sacral decubitus ulcer   . Paraplegia ([facility]) 11/01/2014  . Sacral ulcer ([facility]) 10/31/2014  . Hypothyroidism 10/31/2014  . Breast cancer of lower-inner quadrant of right female breast ([facility]) 12/22/2012  . HTN (hypertension) 04/15/2012  . Left knee DJD 04/13/2012    Class: Chronic    Past Surgical History:  Procedure Laterality Date  . ABDOMINAL HYSTERECTOMY  ~ 1976  . BACK SURGERY    . BREAST BIOPSY Right 2011  . BREAST LUMPECTOMY Right 2011  . CALDWELL LUC  ~ 2003   "  benign tumor removed from up under gum" (04/13/2012)  . DACROCYSTORHINOSTOMY  ~ 2000   "put stents in my tear ducts; both eyes" (04/13/2012)  . EYE SURGERY  2004   STENTS TO BIL EYES  . I&D EXTREMITY Bilateral 11/03/2014   Procedure: IRRIGATION AND DEBRIDEMENT BILATERAL HEEL;  Surgeon: Irene Limbo, MD;  Location: [facility];  Service: Plastics;  Laterality: Bilateral;  . INCISION AND DRAINAGE OF WOUND Left 11/03/2014   Procedure: IRRIGATION AND DEBRIDEMENT SACRAL ULCER;  Surgeon: Irene Limbo, MD;  Location: [facility];  Service: Plastics;  Laterality: Left;  . JOINT REPLACEMENT    . POSTERIOR CERVICAL LAMINECTOMY  2006  . SKIN  SPLIT GRAFT Left 08/05/2016   Procedure: SURGICAL PREP  FOR GRAFTING APPLICATION ACELL TO LEFT ISCHIUM;  Surgeon: Irene Limbo, MD;  Location: [facility];  Service: Plastics;  Laterality: Left;  . TONSILLECTOMY AND ADENOIDECTOMY  ~ 1954  . TOTAL KNEE ARTHROPLASTY WITH REVISION COMPONENTS  04/13/2012   Procedure: TOTAL KNEE ARTHROPLASTY WITH REVISION COMPONENTS;  Surgeon: Hessie Dibble, MD;  Location: [facility];  Service: Orthopedics;  Laterality: Left;     OB History   No obstetric history on file.      Home Medications    Prior to Admission medications   Medication Sig Start Date End Date Taking? Authorizing Provider  ascorbic acid (VITAMIN C) 500 MG tablet Take 1 tablet (500 mg total) by mouth daily. 11/07/14   Francesca Oman, DO  baclofen (LIORESAL) 20 MG tablet Take 20 mg by mouth every 6 (six) hours as needed for muscle spasms.     [provider]  bisacodyl (DULCOLAX) 10 MG suppository Place 1 suppository (10 mg total) rectally daily as needed for severe constipation. 09/23/18   Aline August, MD  docusate sodium (COLACE) 100 MG capsule Take 100 mg by mouth 2 (two) times daily as needed (FOR CONSTIPATION).     [provider]  DULoxetine (CYMBALTA) 60 MG capsule Take 120 mg by mouth daily.     [provider]  ferrous sulfate 325 (65 FE) MG tablet Take 325 mg by mouth daily after supper.     [provider]  folic acid (FOLVITE) 1 MG tablet Take 1 mg by mouth daily.     [provider]  furosemide (LASIX) 40 MG tablet 80mg  in AM and 40mg  in PM till 09/26/18, then 80mg  in AM from 09/27/18 till 10/03/18, then 40mg  in AM from 10/04/18 onwards 09/23/18   Aline August, MD  irbesartan (AVAPRO) 75 MG tablet Take 2 tablets (150 mg total) by mouth daily. 09/24/18   Aline August, MD  levothyroxine (SYNTHROID, LEVOTHROID) 125 MCG tablet Take 1 tablet (125 mcg total) by mouth daily before breakfast. Patient taking differently: Take 125 mcg by mouth daily.   11/07/14   Francesca Oman, DO  metFORMIN (GLUCOPHAGE) 500 MG tablet Take 250 mg by mouth 2 (two) times daily with a meal.    [provider]  metoprolol (LOPRESSOR) 100 MG tablet Take 100 mg by mouth 2 (two) times daily.  10/28/14   [provider]  mometasone-formoterol (DULERA) 100-5 MCG/ACT AERO Inhale 2 puffs into the lungs 2 (two) times daily. 09/23/18   Aline August, MD  Multiple Vitamin (MULTIVITAMIN WITH MINERALS) TABS tablet Take 1 tablet by mouth daily. 11/07/14   Francesca Oman, DO  NIFEdipine (PROCARDIA XL/ADALAT-CC) 30 MG 24 hr tablet Take 30 mg by mouth daily after supper.     [provider]  oxyCODONE-acetaminophen (PERCOCET) 10-325 MG tablet Take 1 tablet by mouth every 6 (six) hours as needed for pain.     [provider]  polyethylene glycol (MIRALAX / GLYCOLAX) 17 g packet Take 17 g by mouth 2 (two) times daily. 09/23/18   Aline August, MD  pregabalin (LYRICA) 75 MG capsule Take 75 mg by mouth 2 (two) times daily.  08/23/18   [provider]  senna-docusate (SENOKOT-S) 8.6-50 MG tablet Take 1 tablet by mouth 2 (two) times daily. 09/23/18   Aline August, MD  zinc sulfate 220 MG capsule Take 1 capsule (220 mg total) by mouth daily. 11/07/14   Francesca Oman, DO    Family History Family History  Problem Relation Age of Onset  . Heart disease Father     Social History Social History   Tobacco Use  . Smoking status: Former Smoker    Packs/day: 0.50    Years: 50.00    Pack years: 25.00    Types: Cigarettes    Quit date: 11/06/2014    Years since quitting: 4.0  . Smokeless tobacco: Never Used  Substance Use Topics  . Alcohol use: No  . Drug use: No     Allergies   Ivp dye [iodinated diagnostic agents], Aspirin, and Sulfa antibiotics   Review of Systems Review of Systems  Psychiatric/Behavioral: Positive for agitation.  All other systems reviewed and are negative.    Physical Exam Updated Vital Signs BP (!) 165/90    Pulse 95   Resp 16   Ht 5\' 3"  (1.6 m)   Wt 88.5 kg   SpO2 96%   BMI 34.54 kg/m   Physical Exam Vitals signs and nursing note reviewed.  Constitutional:      General: She is not in acute distress.    Appearance: Normal appearance. She is well-developed.  HENT:     Head: Normocephalic and atraumatic.     Right Ear: Hearing normal.     Left Ear: Hearing normal.     Nose: Nose normal.  Eyes:     Conjunctiva/sclera: Conjunctivae normal.     Pupils: Pupils are equal, round, and reactive to light.  Neck:     Musculoskeletal: Normal range of motion and neck supple.  Cardiovascular:     Rate and Rhythm: Regular rhythm.     Heart sounds: S1 normal and S2 normal. No murmur. No friction rub. No gallop.   Pulmonary:     Effort: Pulmonary effort is normal. No respiratory distress.     Breath sounds: Normal breath sounds.  Chest:     Chest wall: No tenderness.  Abdominal:     General: Bowel sounds are normal.     Palpations: Abdomen is soft.     Tenderness: There is no abdominal tenderness. There is no guarding or rebound. Negative signs include Murphy's sign and McBurney's sign.     Hernia: No hernia is present.  Musculoskeletal: Normal range of motion.  Skin:    General: Skin is warm and dry.     Findings: No rash.  Neurological:     Mental Status: She is alert and oriented to person, place, and time.     GCS: GCS eye subscore is 4. GCS verbal subscore is 5. GCS motor subscore is 6.     Cranial Nerves: No cranial nerve deficit.     Sensory: No sensory deficit.     Coordination: Coordination normal.  Psychiatric:        Attention and Perception: Attention normal.  Mood and Affect: Mood normal.        Speech: Speech normal.        Behavior: Behavior normal.        Thought Content: Thought content normal. Thought content does not include homicidal or suicidal ideation.      ED Treatments / Results  Labs (all labs ordered are listed, but only abnormal results are  displayed) Labs Reviewed  COMPREHENSIVE METABOLIC PANEL - Abnormal; Notable for the following components:      Result Value   Glucose, Bld 166 (*)    Creatinine, Ser 1.02 (*)    GFR calc non Af Amer 56 (*)    All other components within normal limits  RAPID URINE DRUG SCREEN, HOSP PERFORMED - Abnormal; Notable for the following components:   Opiates POSITIVE (*)    All other components within normal limits  ACETAMINOPHEN LEVEL - Abnormal; Notable for the following components:   Acetaminophen (Tylenol), Serum <10 (*)    All other components within normal limits  CBG MONITORING, ED - Abnormal; Notable for the following components:   Glucose-Capillary 184 (*)    All other components within normal limits  CBC  ETHANOL  SALICYLATE LEVEL    EKG None  Radiology No results found.  Procedures Procedures (including critical care time)  Medications Ordered in ED Medications - No data to display   Initial Impression / Assessment and Plan / ED Course  I have reviewed the triage vital signs and the nursing notes.  Pertinent labs & imaging results that were available during my care of the patient were reviewed by me and considered in my medical decision making (see chart for details).        Patient presents to the emergency department after having an argument with her husband and then performing a suicidal gesture.  At arrival to the ER she states that she does not feel like she is suicidal and does not think she would harm herself if discharged.  Husband, however, felt that she was serious at the time.  Patient took what sounds like only 1 or 2 extra oxycodone tablets tonight during her gesture.  She has been monitored and has not had any sedation or respiratory depression.  Remainder of work-up unremarkable.  Patient medically cleared to undergo psychiatric evaluation.  Final Clinical Impressions(s) / ED Diagnoses   Final diagnoses:  Suicidal ideation    ED Discharge Orders     None       Zyrus Hetland, Gwenyth Allegra, MD 12/02/18 0630

## 2018-12-02 NOTE — ED Notes (Signed)
Introduced self to pt.  Sitter at bedside.  Pt alert and oriented,calm, cooperative at this time.  Pt denies SI/HI.  Will continue to monitor.

## 2018-12-02 NOTE — ED Notes (Signed)
TTS monitor at bedside 

## 2018-12-02 NOTE — Discharge Instructions (Addendum)
Pt has outpatient providers already in place. She declined any need for mental health resources at this time.   Your urine sample showed a possible infection.  Please take the antibiotics as directed.

## 2018-12-02 NOTE — ED Notes (Signed)
Spoke with pts husband, Xymena Mccalman.  Husband requesting update about plan of care, reported that he would remain near his phone to discuss plan with provider when contacted.

## 2018-12-02 NOTE — BH Assessment (Signed)
Tele Assessment Note   Patient Name: [name] MRN: [id number] Referring Physician: Dr. Joseph Berkshire Location of Patient: [facility] Location of Provider: [place] is an 68 y.o. female brought in by EMS from home. EMS reported patient had a "handful of oxycodone 10- 325 in her mouth that had to be removed from her mouth by her husband". Patient stated "I only took one pill and an extra pill". Patient denied intentions of trying to kill herself and has no wishes in harming herself or anybody else.  Patient reports she and her husband were having a "heated argument" and he said that he wished she was dead, then patient stated she told husband "well give me my medicine". Patient denied putting handful of Oxycodone in her mouth around 2200. Per notes husband called EMS around 2300. EMS reported 2 Oxycodone are unaccounted from the pill bottle. Patient reported dropping 2 pills on the floor, per EMS husband reported that she put them on her mouth and they had to be taken out of her mouth by him. Patient reported feeling very sad that her husband stated "I wish you would die".  Collateral Contact: [name], husband, [phone]. Patient reported her husband may not be available at this time of morning due to hard of hearing. TTS Clinician unable to reach patients husband at this time.  Diagnosis: Major depressive disorder  Past Medical History:  Past Medical History:  Diagnosis Date  . Anemia   . Arthritis    "all over" (04/13/2012)  . Cancer of right breast ([place]) 2011  . Cervical neuropathy    PERIPHERAL NEUROPATHY , HAD CERVICAL FUSION  . Diabetes mellitus without complication ([place])   . Difficult intubation    Fastrach #4 LMA then # 7 ETT used in 2006 [facility], cervical laminectomy)  . GERD (gastroesophageal reflux disease)   . History of kidney stones   . Hypercholesteremia   . Hypertension   . Hypothyroidism   . Kidney stones   .  Neurogenic bladder   . Osteomyelitis ([place])   . Paraplegia ([place])   . Peripheral neuropathy   . PONV (postoperative nausea and vomiting)   . Sacral decubitus ulcer 10/2014  . Sepsis due to urinary tract infection [facility])     Past Surgical History:  Procedure Laterality Date  . ABDOMINAL HYSTERECTOMY  ~ 1976  . BACK SURGERY    . BREAST BIOPSY Right 2011  . BREAST LUMPECTOMY Right 2011  . CALDWELL LUC  ~ 2003   "benign tumor removed from up under gum" (04/13/2012)  . DACROCYSTORHINOSTOMY  ~ 2000   "put stents in my tear ducts; both eyes" (04/13/2012)  . EYE SURGERY  2004   STENTS TO BIL EYES  . I&D EXTREMITY Bilateral 11/03/2014   Procedure: IRRIGATION AND DEBRIDEMENT BILATERAL HEEL;  Surgeon: Irene Limbo, MD;  Location: [place];  Service: Plastics;  Laterality: Bilateral;  . INCISION AND DRAINAGE OF WOUND Left 11/03/2014   Procedure: IRRIGATION AND DEBRIDEMENT SACRAL ULCER;  Surgeon: Irene Limbo, MD;  Location: [place];  Service: Plastics;  Laterality: Left;  . JOINT REPLACEMENT    . POSTERIOR CERVICAL LAMINECTOMY  2006  . SKIN SPLIT GRAFT Left 08/05/2016   Procedure: SURGICAL PREP  FOR GRAFTING APPLICATION ACELL TO LEFT ISCHIUM;  Surgeon: Irene Limbo, MD;  Location: [place];  Service: Plastics;  Laterality: Left;  . TONSILLECTOMY AND ADENOIDECTOMY  ~ 1954  . TOTAL KNEE ARTHROPLASTY WITH REVISION COMPONENTS  04/13/2012   Procedure: TOTAL  KNEE ARTHROPLASTY WITH REVISION COMPONENTS;  Surgeon: Hessie Dibble, MD;  Location: [place];  Service: Orthopedics;  Laterality: Left;    Family History:  Family History  Problem Relation Age of Onset  . Heart disease Father     Social History:  reports that she quit smoking about 4 years ago. Her smoking use included cigarettes. She has a 25.00 pack-year smoking history. She has never used smokeless tobacco. She reports that she does not drink alcohol or use drugs.  Additional Social History:     CIWA: CIWA-Ar BP: (!) 170/70 Pulse Rate: (!)  101 COWS:    Allergies:  Allergies  Allergen Reactions  . Ivp Dye [Iodinated Diagnostic Agents] Other (See Comments)    "hot and sweaty and almost passed out"  . Aspirin Hives  . Sulfa Antibiotics Hives    Home Medications: (Not in a hospital admission)   OB/GYN Status:  No LMP recorded. Patient has had a hysterectomy.  General Assessment Data Location of Assessment: [facility] ED TTS Assessment: In system Is this a Tele or Face-to-Face Assessment?: Tele Assessment Is this an Initial Assessment or a Re-assessment for this encounter?: Initial Assessment Patient Accompanied by:: N/A Language Other than English: No Living Arrangements: (family home) What gender do you identify as?: Female Marital status: Married Living Arrangements: Spouse/significant other Can pt return to current living arrangement?: Yes Admission Status: Voluntary Is patient capable of signing voluntary admission?: Yes Referral Source: Self/Family/Friend     Crisis Care Plan Living Arrangements: Spouse/significant other Legal Guardian: (self) Name of Psychiatrist: (none) Name of Therapist: (none)  Education Status Is patient currently in school?: No Is the patient employed, unemployed or receiving disability?: Receiving disability income  Risk to self with the past 6 months Suicidal Ideation: No Has patient been a risk to self within the past 6 months prior to admission? : No Suicidal Intent: No Has patient had any suicidal intent within the past 6 months prior to admission? : No Is patient at risk for suicide?: No Suicidal Plan?: No Has patient had any suicidal plan within the past 6 months prior to admission? : No Access to Means: No What has been your use of drugs/alcohol within the last 12 months?: (none) Previous Attempts/Gestures: No How many times?: (0) Other Self Harm Risks: (none) Triggers for Past Attempts: None known Intentional Self Injurious Behavior: None Family Suicide History:  No Recent stressful life event(s): (marital conflict) Persecutory voices/beliefs?: No Depression: Yes Depression Symptoms: Insomnia(denied) Substance abuse history and/or treatment for substance abuse?: No Suicide prevention information given to non-admitted patients: Not applicable  Risk to Others within the past 6 months Homicidal Ideation: No Does patient have any lifetime risk of violence toward others beyond the six months prior to admission? : No Thoughts of Harm to Others: No Current Homicidal Intent: No Current Homicidal Plan: No Access to Homicidal Means: No Identified Victim: (none) History of harm to others?: No Assessment of Violence: None Noted Violent Behavior Description: (none) Does patient have access to weapons?: No Criminal Charges Pending?: No Does patient have a court date: No Is patient on probation?: No  Psychosis Hallucinations: None noted Delusions: None noted  Mental Status Report Appearance/Hygiene: Unremarkable Eye Contact: Good Motor Activity: Freedom of movement Speech: Logical/coherent Level of Consciousness: Alert Mood: Anxious, Depressed Affect: Anxious, Depressed Anxiety Level: Minimal Thought Processes: Relevant Judgement: Partial Orientation: Person, Place, Time, Situation Obsessive Compulsive Thoughts/Behaviors: None  Cognitive Functioning Concentration: Fair Memory: Recent Intact Is patient IDD: No Insight: Poor Impulse Control:  Poor Appetite: Fair Have you had any weight changes? : No Change Sleep: Decreased Total Hours of Sleep: (few) Vegetative Symptoms: None  ADLScreening [facility] Assessment Services) Patient's cognitive ability adequate to safely complete daily activities?: Yes Patient able to express need for assistance with ADLs?: Yes Independently performs ADLs?: No(needs assistance, can't walk)  Prior Inpatient Therapy Prior Inpatient Therapy: No  Prior Outpatient Therapy Prior Outpatient Therapy: No Does  patient have an ACCT team?: No Does patient have Intensive In-House Services?  : No Does patient have [facility] services? : No Does patient have P4CC services?: No  ADL Screening (condition at time of admission) Patient's cognitive ability adequate to safely complete daily activities?: Yes Patient able to express need for assistance with ADLs?: Yes Independently performs ADLs?: No(needs assistance, can't walk)  Disposition:  Disposition Initial Assessment Completed for this Encounter: Yes  Renetta Chalk, NP, disposition pending collateral contact for additional information.  This service was provided via telemedicine using a 2-way, interactive audio and video technology.  Names of all persons participating in this telemedicine service and their role in this encounter. Name: [name] Role: Patient  Name: Kirtland Bouchard Role: TTS Clinician  Name:  Role:   Name:  Role:     Venora Maples 12/02/2018 3:45 AM

## 2018-12-02 NOTE — Consult Note (Addendum)
[facility] Psych ED Discharge  12/02/2018 12:11 PM [name]  MRN:  [id number] Principal Problem: Adjustment disorder with disturbance of emotion Discharge Diagnoses: Principal Problem:   Adjustment disorder with disturbance of emotion   Subjective: Pt was seen and chart reviewed with treatment team and Dr Mariea Clonts. Pt denies suicidal/homicidal ideation, denies auditory/visual hallucinations and does not appear to be responding to internal stimuli. Pt stated she was in an argument with her husband and she became upset and placed more then one pill in her mouth. She stated she has no mental health history and was just upset with her husband. Pt stated she has never tried to harm herself before. She stated she is closely followed by her pain management doctor, Dr Greta Doom,  every month and takes her medication as prescribed. Pt is able to contract for safety and stated, "I never want to come back here for something like this again." UDS positive for opiates (prescribed per PDMP lookup) and BAL negative.  Pt is psychiatrically clear.   Addendum: Pt's urinalysis returned and indicates a UTI, EDP notified by Dr Mariea Clonts.   Total Time spent with patient: 30 minutes  Past Psychiatric History: As above  Past Medical History:  Past Medical History:  Diagnosis Date  . Anemia   . Arthritis    "all over" (04/13/2012)  . Cancer of right breast ([place]) 2011  . Cervical neuropathy    PERIPHERAL NEUROPATHY , HAD CERVICAL FUSION  . Diabetes mellitus without complication ([place])   . Difficult intubation    Fastrach #4 LMA then # 7 ETT used in 2006 [facility], cervical laminectomy)  . GERD (gastroesophageal reflux disease)   . History of kidney stones   . Hypercholesteremia   . Hypertension   . Hypothyroidism   . Kidney stones   . Neurogenic bladder   . Osteomyelitis ([place])   . Paraplegia ([place])   . Peripheral neuropathy   . PONV (postoperative nausea and vomiting)   . Sacral decubitus ulcer 10/2014  . Sepsis due  to urinary tract infection [facility])     Past Surgical History:  Procedure Laterality Date  . ABDOMINAL HYSTERECTOMY  ~ 1976  . BACK SURGERY    . BREAST BIOPSY Right 2011  . BREAST LUMPECTOMY Right 2011  . CALDWELL LUC  ~ 2003   "benign tumor removed from up under gum" (04/13/2012)  . DACROCYSTORHINOSTOMY  ~ 2000   "put stents in my tear ducts; both eyes" (04/13/2012)  . EYE SURGERY  2004   STENTS TO BIL EYES  . I&D EXTREMITY Bilateral 11/03/2014   Procedure: IRRIGATION AND DEBRIDEMENT BILATERAL HEEL;  Surgeon: Irene Limbo, MD;  Location: [place];  Service: Plastics;  Laterality: Bilateral;  . INCISION AND DRAINAGE OF WOUND Left 11/03/2014   Procedure: IRRIGATION AND DEBRIDEMENT SACRAL ULCER;  Surgeon: Irene Limbo, MD;  Location: [place];  Service: Plastics;  Laterality: Left;  . JOINT REPLACEMENT    . POSTERIOR CERVICAL LAMINECTOMY  2006  . SKIN SPLIT GRAFT Left 08/05/2016   Procedure: SURGICAL PREP  FOR GRAFTING APPLICATION ACELL TO LEFT ISCHIUM;  Surgeon: Irene Limbo, MD;  Location: [place];  Service: Plastics;  Laterality: Left;  . TONSILLECTOMY AND ADENOIDECTOMY  ~ 1954  . TOTAL KNEE ARTHROPLASTY WITH REVISION COMPONENTS  04/13/2012   Procedure: TOTAL KNEE ARTHROPLASTY WITH REVISION COMPONENTS;  Surgeon: Hessie Dibble, MD;  Location: [place];  Service: Orthopedics;  Laterality: Left;   Family History:  Family History  Problem Relation Age of Onset  .  Heart disease Father    Family Psychiatric  History: None per chart review.  Social History:  Social History   Substance and Sexual Activity  Alcohol Use No     Social History   Substance and Sexual Activity  Drug Use No    Social History   Socioeconomic History  . Marital status: Married    Spouse name: Not on file  . Number of children: Not on file  . Years of education: Not on file  . Highest education level: Not on file  Occupational History  . Not on file  Social Needs  . Financial resource strain: Not on file   . Food insecurity    Worry: Not on file    Inability: Not on file  . Transportation needs    Medical: Not on file    Non-medical: Not on file  Tobacco Use  . Smoking status: Former Smoker    Packs/day: 0.50    Years: 50.00    Pack years: 25.00    Types: Cigarettes    Quit date: 11/06/2014    Years since quitting: 4.0  . Smokeless tobacco: Never Used  Substance and Sexual Activity  . Alcohol use: No  . Drug use: No  . Sexual activity: Not Currently  Lifestyle  . Physical activity    Days per week: Not on file    Minutes per session: Not on file  . Stress: Not on file  Relationships  . Social Herbalist on phone: Not on file    Gets together: Not on file    Attends religious service: Not on file    Active member of club or organization: Not on file    Attends meetings of clubs or organizations: Not on file    Relationship status: Not on file  Other Topics Concern  . Not on file  Social History Narrative  . Not on file    Has this patient used any form of tobacco in the last 30 days? (Cigarettes, Smokeless Tobacco, Cigars, and/or Pipes) Prescription not provided because: Pt denies tobacco use  Current Medications: No current facility-administered medications for this encounter.    Current Outpatient Medications  Medication Sig Dispense Refill  . ascorbic acid (VITAMIN C) 500 MG tablet Take 1 tablet (500 mg total) by mouth daily. 30 tablet 12  . baclofen (LIORESAL) 20 MG tablet Take 20 mg by mouth every 6 (six) hours as needed for muscle spasms.   0  . bisacodyl (DULCOLAX) 10 MG suppository Place 1 suppository (10 mg total) rectally daily as needed for severe constipation. 12 suppository 0  . docusate sodium (COLACE) 100 MG capsule Take 100 mg by mouth 2 (two) times daily as needed (FOR CONSTIPATION).     . DULoxetine (CYMBALTA) 60 MG capsule Take 120 mg by mouth daily.     . ferrous sulfate 325 (65 FE) MG tablet Take 325 mg by mouth daily after supper.     .  folic acid (FOLVITE) 1 MG tablet Take 1 mg by mouth daily.     . furosemide (LASIX) 40 MG tablet 80mg  in AM and 40mg  in PM till 09/26/18, then 80mg  in AM from 09/27/18 till 10/03/18, then 40mg  in AM from 10/04/18 onwards 60 tablet 0  . irbesartan (AVAPRO) 75 MG tablet Take 2 tablets (150 mg total) by mouth daily. 60 tablet 0  . levothyroxine (SYNTHROID, LEVOTHROID) 125 MCG tablet Take 1 tablet (125 mcg total) by mouth daily before breakfast. (Patient  taking differently: Take 125 mcg by mouth daily. ) 30 tablet 0  . metFORMIN (GLUCOPHAGE) 500 MG tablet Take 250 mg by mouth 2 (two) times daily with a meal.    . metoprolol (LOPRESSOR) 100 MG tablet Take 100 mg by mouth 2 (two) times daily.     . mometasone-formoterol (DULERA) 100-5 MCG/ACT AERO Inhale 2 puffs into the lungs 2 (two) times daily. 1 g 0  . Multiple Vitamin (MULTIVITAMIN WITH MINERALS) TABS tablet Take 1 tablet by mouth daily. 30 tablet 12  . NIFEdipine (PROCARDIA XL/ADALAT-CC) 30 MG 24 hr tablet Take 30 mg by mouth daily after supper.     Marland Kitchen oxyCODONE-acetaminophen (PERCOCET) 10-325 MG tablet Take 1 tablet by mouth every 6 (six) hours as needed for pain.     . polyethylene glycol (MIRALAX / GLYCOLAX) 17 g packet Take 17 g by mouth 2 (two) times daily. 30 each 0  . pregabalin (LYRICA) 75 MG capsule Take 75 mg by mouth 2 (two) times daily.     Marland Kitchen senna-docusate (SENOKOT-S) 8.6-50 MG tablet Take 1 tablet by mouth 2 (two) times daily. 30 tablet 0  . zinc sulfate 220 MG capsule Take 1 capsule (220 mg total) by mouth daily. 30 capsule 12   PTA Medications: (Not in a hospital admission)   Musculoskeletal: Strength & Muscle Tone: within normal limits Gait & Station: normal Patient leans: N/A  Psychiatric Specialty Exam: Physical Exam  Nursing note and vitals reviewed. Constitutional: She is oriented to person, place, and time. She appears well-developed and well-nourished.  HENT:  Head: Normocephalic.  Neck: Normal range of motion.   Respiratory: Effort normal.  Musculoskeletal: Normal range of motion.  Neurological: She is alert and oriented to person, place, and time.  Psychiatric: She has a normal mood and affect. Her speech is normal and behavior is normal. Thought content normal. Cognition and memory are normal. She expresses impulsivity.    Review of Systems  Psychiatric/Behavioral: Negative.   All other systems reviewed and are negative.   Blood pressure (!) 164/72, pulse (!) 112, temperature (!) 97.1 F (36.2 C), temperature source Oral, resp. rate 16, height 5\' 3"  (1.6 m), weight 88.5 kg, SpO2 98 %.Body mass index is 34.54 kg/m.  General Appearance: Casual  Eye Contact:  Good  Speech:  Clear and Coherent and Normal Rate  Volume:  Normal  Mood:  Euthymic  Affect:  Congruent  Thought Process:  Coherent, Goal Directed and Descriptions of Associations: Intact  Orientation:  Full (Time, Place, and Person)  Thought Content:  Logical  Suicidal Thoughts:  No  Homicidal Thoughts:  No  Memory:  Immediate;   Good Recent;   Good Remote;   Fair  Judgement:  Fair  Insight:  Fair  Psychomotor Activity:  Normal  Concentration:  Concentration: Good and Attention Span: Good  Recall:  Good  Fund of Knowledge:  Good  Language:  Good  Akathisia:  Negative  Handed:  Right  AIMS (if indicated):   N/A  Assets:  Agricultural consultant Housing Social Support  ADL's:  Intact  Cognition:  WNL  Sleep:   N/A     Demographic Factors:  Age 70 or older and Unemployed  Loss Factors: Decline in physical health  Historical Factors: Impulsivity  Risk Reduction Factors:   Sense of responsibility to family, Living with another person, especially a relative and Positive social support  Continued Clinical Symptoms:  Medical Diagnoses and Treatments/Surgeries  Cognitive Features That Contribute To Risk:  Closed-mindedness  Suicide Risk:  Minimal: No identifiable suicidal ideation.   Patients presenting with no risk factors but with morbid ruminations; may be classified as minimal risk based on the severity of the depressive symptoms   Plan Of Care/Follow-up recommendations:  Activity:  as tolerated Diet:  Heart healthy  Disposition: Adjustment disorder with disturbance of emotion Take all medications as prescribed. Keep all follow-up appointments as scheduled.  Do not consume alcohol or use illegal drugs while on prescription medications. Report any adverse effects from your medications to your primary care provider promptly.  In the event of recurrent symptoms or worsening symptoms, call 911, a crisis hotline, or go to the nearest emergency department for evaluation.   Ethelene Hal, NP 12/02/2018, 12:11 PM   Patient seen by telemedicine for psychiatric evaluation, chart reviewed and case discussed with the physician extender and developed treatment plan. Reviewed the information documented and agree with the treatment plan.  Buford Dresser, DO 12/02/18 3:57 PM

## 2018-12-02 NOTE — ED Triage Notes (Addendum)
Arrived by [facility] from home. EMS reports that Patient had a "handful of oxycodone 10-325 in her mouth that had to be removed from her mouth by her husband" Patient reports she and her husband were having  'heated argument" and he said that he wished she was dead, then patient sates she told husband "well give me my medicine". Patient says her husband gave her the Oxycodone and she did put a handful in her mouth around 2200; husband called EMS around 2300. EMS reports 2 Oxycodone are unaccounted for from pill bottle. Patient denies "actually" wanting to die.

## 2018-12-02 NOTE — ED Provider Notes (Addendum)
  Provider Note MRN:  [id number]  Arrival date & time: 12/02/18    ED Course and Medical Decision Making  Assumed care from Dr. Waverly Ferrari at shift change.  Reactionary questionable suicide attempt, being evaluated by psychiatry, may be able to go home.  11 AM update: TTS is now reevaluating patient virtually with monitor in the room.  12:30 PM update: Evaluated by psychiatry, appropriate for discharge, discharged by psychiatry.  Patient had a dirty urine sample with mixed evidence to suggest UTI.  History of recent catheterization, will cover with keflex.   Final Clinical Impressions(s) / ED Diagnoses     ICD-10-CM   1. Suicidal ideation  R45.851   2. Acute cystitis with hematuria  N30.01     ED Discharge Orders         Ordered    cephALEXin (KEFLEX) 500 MG capsule  3 times daily     12/02/18 1329            Discharge Instructions     Pt has outpatient providers already in place. She declined any need for mental health resources at this time.   Your urine sample showed a possible infection.  Please take the antibiotics as directed.     Barth Kirks. Sedonia Small, [place] [email or web address]    Maudie Flakes, MD 12/02/18 1258    Maudie Flakes, MD 12/02/18 1330

## 2018-12-02 NOTE — BH Assessment (Signed)
[facility] Assessment Progress Note  Per Buford Dresser, DO, this pt does not require psychiatric hospitalization at this time.  Pt is to be discharged from [facility].  No behavioral health resources are indicated for pt at this time.  Pt's nurse has been notified.  Jalene Mullet, [place] Triage Specialist [phone]

## 2018-12-02 NOTE — ED Notes (Signed)
Pt requesting ambulance transport home.   PTAR notified of need of transport.

## 2018-12-02 NOTE — ED Notes (Signed)
Psych consult in progress

## 2018-12-02 NOTE — ED Notes (Signed)
Pt c/o hand pain. MD made aware.

## 2018-12-03 ENCOUNTER — Ambulatory Visit: Payer: PPO | Admitting: Cardiovascular Disease

## 2018-12-03 NOTE — Progress Notes (Signed)
TOC CM received call from pt's dtr, Babs Bertin requesting pt's room number and if she could have a visitor. Explained visitor is allowed after screened. Provided pt's room number. [place], [place] ED TOC CM [phone]

## 2018-12-05 LAB — URINE CULTURE: Culture: >=100000 — AB

## 2018-12-06 ENCOUNTER — Telehealth: Payer: Self-pay

## 2018-12-06 DIAGNOSIS — Z9350 Unspecified cystostomy status: Secondary | ICD-10-CM | POA: Diagnosis not present

## 2018-12-06 DIAGNOSIS — Z87440 Personal history of urinary (tract) infections: Secondary | ICD-10-CM | POA: Diagnosis not present

## 2018-12-06 DIAGNOSIS — K219 Gastro-esophageal reflux disease without esophagitis: Secondary | ICD-10-CM | POA: Diagnosis not present

## 2018-12-06 DIAGNOSIS — E7800 Pure hypercholesterolemia, unspecified: Secondary | ICD-10-CM | POA: Diagnosis not present

## 2018-12-06 DIAGNOSIS — Z9181 History of falling: Secondary | ICD-10-CM | POA: Diagnosis not present

## 2018-12-06 DIAGNOSIS — D649 Anemia, unspecified: Secondary | ICD-10-CM | POA: Diagnosis not present

## 2018-12-06 DIAGNOSIS — M159 Polyosteoarthritis, unspecified: Secondary | ICD-10-CM | POA: Diagnosis not present

## 2018-12-06 DIAGNOSIS — Z87891 Personal history of nicotine dependence: Secondary | ICD-10-CM | POA: Diagnosis not present

## 2018-12-06 DIAGNOSIS — G8250 Quadriplegia, unspecified: Secondary | ICD-10-CM | POA: Diagnosis not present

## 2018-12-06 DIAGNOSIS — E1142 Type 2 diabetes mellitus with diabetic polyneuropathy: Secondary | ICD-10-CM | POA: Diagnosis not present

## 2018-12-06 DIAGNOSIS — I509 Heart failure, unspecified: Secondary | ICD-10-CM | POA: Diagnosis not present

## 2018-12-06 DIAGNOSIS — S31821D Laceration without foreign body of left buttock, subsequent encounter: Secondary | ICD-10-CM | POA: Diagnosis not present

## 2018-12-06 DIAGNOSIS — Z96652 Presence of left artificial knee joint: Secondary | ICD-10-CM | POA: Diagnosis not present

## 2018-12-06 DIAGNOSIS — Z853 Personal history of malignant neoplasm of breast: Secondary | ICD-10-CM | POA: Diagnosis not present

## 2018-12-06 DIAGNOSIS — M4712 Other spondylosis with myelopathy, cervical region: Secondary | ICD-10-CM | POA: Diagnosis not present

## 2018-12-06 DIAGNOSIS — I110 Hypertensive heart disease with heart failure: Secondary | ICD-10-CM | POA: Diagnosis not present

## 2018-12-06 DIAGNOSIS — Z7984 Long term (current) use of oral hypoglycemic drugs: Secondary | ICD-10-CM | POA: Diagnosis not present

## 2018-12-06 DIAGNOSIS — N319 Neuromuscular dysfunction of bladder, unspecified: Secondary | ICD-10-CM | POA: Diagnosis not present

## 2018-12-06 DIAGNOSIS — K5900 Constipation, unspecified: Secondary | ICD-10-CM | POA: Diagnosis not present

## 2018-12-06 DIAGNOSIS — E039 Hypothyroidism, unspecified: Secondary | ICD-10-CM | POA: Diagnosis not present

## 2018-12-06 DIAGNOSIS — Z9981 Dependence on supplemental oxygen: Secondary | ICD-10-CM | POA: Diagnosis not present

## 2018-12-06 NOTE — Telephone Encounter (Signed)
Post ED Visit - Positive Culture Follow-up: Unsuccessful Patient Follow-up  Culture assessed and recommendations reviewed by:  []  Elenor Quinones, Pharm.D. []  Heide Guile, Pharm.D., BCPS AQ-ID []  Parks Neptune, Pharm.D., BCPS []  Alycia Rossetti, Pharm.D., BCPS []  [place], Pharm.D., BCPS, AAHIVP []  Legrand Como, Pharm.D., BCPS, AAHIVP []  Wynell Balloon, PharmD []  Vincenza Hews, PharmD, BCPS Anh Pham  Pharm D Positive urine culture For Symptom check  May need cipro []  Patient discharged without antimicrobial prescription and treatment is now indicated []  Organism is resistant to prescribed ED discharge antimicrobial []  Patient with positive blood cultures   Unable to contact patient after 3 attempts, letter will be sent to address on file  Genia Del 12/06/2018, 11:24 AM

## 2018-12-06 NOTE — Progress Notes (Addendum)
ED Antimicrobial Stewardship Positive Culture Follow Up   [name] is an 68 y.o. female who presented to [facility] on 12/02/2018 with suicidal ideation.  Chief Complaint  Patient presents with  . Suicidal    Recent Results (from the past 720 hour(s))  Urine culture     Status: Abnormal   Collection Time: 12/02/18 10:35 AM   Specimen: Urine, Clean Catch  Result Value Ref Range Status   Specimen Description   Final    URINE, CLEAN CATCH Performed at [facility], [place] [address] [place], [place] [address]    Special Requests   Final    NONE Performed at [facility], [place] [address] [place], [place] [address]    Culture (A)  Final    >=100,000 COLONIES/mL PSEUDOMONAS AERUGINOSA >=100,000 COLONIES/mL ENTEROBACTER AEROGENES >=100,000 COLONIES/mL ESCHERICHIA COLI    Report Status 12/05/2018 FINAL  Final   Organism ID, Bacteria PSEUDOMONAS AERUGINOSA (A)  Final   Organism ID, Bacteria ENTEROBACTER AEROGENES (A)  Final   Organism ID, Bacteria ESCHERICHIA COLI (A)  Final      Susceptibility   Enterobacter aerogenes - MIC*    CEFAZOLIN >=64 RESISTANT Resistant     CEFTRIAXONE 4 SENSITIVE Sensitive     CIPROFLOXACIN <=0.25 SENSITIVE Sensitive     GENTAMICIN <=1 SENSITIVE Sensitive     IMIPENEM 1 SENSITIVE Sensitive     NITROFURANTOIN 64 INTERMEDIATE Intermediate     TRIMETH/SULFA <=20 SENSITIVE Sensitive     PIP/TAZO >=128 RESISTANT Resistant     * >=100,000 COLONIES/mL ENTEROBACTER AEROGENES   Escherichia coli - MIC*    AMPICILLIN <=2 SENSITIVE Sensitive     CEFAZOLIN <=4 SENSITIVE Sensitive     CEFTRIAXONE <=1 SENSITIVE Sensitive     CIPROFLOXACIN <=0.25 SENSITIVE Sensitive     GENTAMICIN <=1 SENSITIVE Sensitive     IMIPENEM <=0.25 SENSITIVE Sensitive     NITROFURANTOIN <=16 SENSITIVE Sensitive     TRIMETH/SULFA <=20 SENSITIVE Sensitive     AMPICILLIN/SULBACTAM <=2 SENSITIVE Sensitive     PIP/TAZO <=4 SENSITIVE Sensitive    Extended ESBL NEGATIVE Sensitive     * >=100,000 COLONIES/mL ESCHERICHIA COLI   Pseudomonas aeruginosa - MIC*    CEFTAZIDIME <=1 SENSITIVE Sensitive     CIPROFLOXACIN <=0.25 SENSITIVE Sensitive     GENTAMICIN <=1 SENSITIVE Sensitive     IMIPENEM 0.5 SENSITIVE Sensitive     PIP/TAZO <=4 SENSITIVE Sensitive     CEFEPIME <=1 SENSITIVE Sensitive     * >=100,000 COLONIES/mL PSEUDOMONAS AERUGINOSA    [x]  Treated with keflex, organism resistant to prescribed antimicrobial []  Patient discharged originally without antimicrobial agent and treatment is now indicated  Plan:  1) wellness check; if patient is symptomatic (fever, hematuria, back pain), then d/c keflex and start ciprofloxacin 500 mg PO bid x10 days (no refill)  ED Provider: Suella Broad, PA-C   Devin Going, Dala Breault P 12/06/2018, 10:09 AM Clinical Pharmacist [phone]

## 2018-12-08 DIAGNOSIS — G894 Chronic pain syndrome: Secondary | ICD-10-CM | POA: Diagnosis not present

## 2018-12-08 DIAGNOSIS — F329 Major depressive disorder, single episode, unspecified: Secondary | ICD-10-CM | POA: Diagnosis not present

## 2018-12-08 DIAGNOSIS — M4712 Other spondylosis with myelopathy, cervical region: Secondary | ICD-10-CM | POA: Diagnosis not present

## 2018-12-08 DIAGNOSIS — M47816 Spondylosis without myelopathy or radiculopathy, lumbar region: Secondary | ICD-10-CM | POA: Diagnosis not present

## 2018-12-09 DIAGNOSIS — I509 Heart failure, unspecified: Secondary | ICD-10-CM | POA: Diagnosis not present

## 2018-12-09 DIAGNOSIS — D649 Anemia, unspecified: Secondary | ICD-10-CM | POA: Diagnosis not present

## 2018-12-09 DIAGNOSIS — N319 Neuromuscular dysfunction of bladder, unspecified: Secondary | ICD-10-CM | POA: Diagnosis not present

## 2018-12-09 DIAGNOSIS — Z9350 Unspecified cystostomy status: Secondary | ICD-10-CM | POA: Diagnosis not present

## 2018-12-09 DIAGNOSIS — Z96652 Presence of left artificial knee joint: Secondary | ICD-10-CM | POA: Diagnosis not present

## 2018-12-09 DIAGNOSIS — Z9181 History of falling: Secondary | ICD-10-CM | POA: Diagnosis not present

## 2018-12-09 DIAGNOSIS — K5900 Constipation, unspecified: Secondary | ICD-10-CM | POA: Diagnosis not present

## 2018-12-09 DIAGNOSIS — Z87440 Personal history of urinary (tract) infections: Secondary | ICD-10-CM | POA: Diagnosis not present

## 2018-12-09 DIAGNOSIS — G8250 Quadriplegia, unspecified: Secondary | ICD-10-CM | POA: Diagnosis not present

## 2018-12-09 DIAGNOSIS — Z87891 Personal history of nicotine dependence: Secondary | ICD-10-CM | POA: Diagnosis not present

## 2018-12-09 DIAGNOSIS — S31821D Laceration without foreign body of left buttock, subsequent encounter: Secondary | ICD-10-CM | POA: Diagnosis not present

## 2018-12-09 DIAGNOSIS — E039 Hypothyroidism, unspecified: Secondary | ICD-10-CM | POA: Diagnosis not present

## 2018-12-09 DIAGNOSIS — I110 Hypertensive heart disease with heart failure: Secondary | ICD-10-CM | POA: Diagnosis not present

## 2018-12-09 DIAGNOSIS — E7800 Pure hypercholesterolemia, unspecified: Secondary | ICD-10-CM | POA: Diagnosis not present

## 2018-12-09 DIAGNOSIS — M4712 Other spondylosis with myelopathy, cervical region: Secondary | ICD-10-CM | POA: Diagnosis not present

## 2018-12-09 DIAGNOSIS — K219 Gastro-esophageal reflux disease without esophagitis: Secondary | ICD-10-CM | POA: Diagnosis not present

## 2018-12-09 DIAGNOSIS — E1142 Type 2 diabetes mellitus with diabetic polyneuropathy: Secondary | ICD-10-CM | POA: Diagnosis not present

## 2018-12-09 DIAGNOSIS — Z9981 Dependence on supplemental oxygen: Secondary | ICD-10-CM | POA: Diagnosis not present

## 2018-12-09 DIAGNOSIS — Z7984 Long term (current) use of oral hypoglycemic drugs: Secondary | ICD-10-CM | POA: Diagnosis not present

## 2018-12-09 DIAGNOSIS — M159 Polyosteoarthritis, unspecified: Secondary | ICD-10-CM | POA: Diagnosis not present

## 2018-12-09 DIAGNOSIS — Z853 Personal history of malignant neoplasm of breast: Secondary | ICD-10-CM | POA: Diagnosis not present

## 2018-12-16 DIAGNOSIS — M4712 Other spondylosis with myelopathy, cervical region: Secondary | ICD-10-CM | POA: Diagnosis not present

## 2018-12-16 DIAGNOSIS — K219 Gastro-esophageal reflux disease without esophagitis: Secondary | ICD-10-CM | POA: Diagnosis not present

## 2018-12-16 DIAGNOSIS — Z9350 Unspecified cystostomy status: Secondary | ICD-10-CM | POA: Diagnosis not present

## 2018-12-16 DIAGNOSIS — Z87891 Personal history of nicotine dependence: Secondary | ICD-10-CM | POA: Diagnosis not present

## 2018-12-16 DIAGNOSIS — Z96652 Presence of left artificial knee joint: Secondary | ICD-10-CM | POA: Diagnosis not present

## 2018-12-16 DIAGNOSIS — S31821D Laceration without foreign body of left buttock, subsequent encounter: Secondary | ICD-10-CM | POA: Diagnosis not present

## 2018-12-16 DIAGNOSIS — Z7984 Long term (current) use of oral hypoglycemic drugs: Secondary | ICD-10-CM | POA: Diagnosis not present

## 2018-12-16 DIAGNOSIS — E039 Hypothyroidism, unspecified: Secondary | ICD-10-CM | POA: Diagnosis not present

## 2018-12-16 DIAGNOSIS — Z87440 Personal history of urinary (tract) infections: Secondary | ICD-10-CM | POA: Diagnosis not present

## 2018-12-16 DIAGNOSIS — Z9181 History of falling: Secondary | ICD-10-CM | POA: Diagnosis not present

## 2018-12-16 DIAGNOSIS — K5900 Constipation, unspecified: Secondary | ICD-10-CM | POA: Diagnosis not present

## 2018-12-16 DIAGNOSIS — E7800 Pure hypercholesterolemia, unspecified: Secondary | ICD-10-CM | POA: Diagnosis not present

## 2018-12-16 DIAGNOSIS — E1142 Type 2 diabetes mellitus with diabetic polyneuropathy: Secondary | ICD-10-CM | POA: Diagnosis not present

## 2018-12-16 DIAGNOSIS — I110 Hypertensive heart disease with heart failure: Secondary | ICD-10-CM | POA: Diagnosis not present

## 2018-12-16 DIAGNOSIS — M159 Polyosteoarthritis, unspecified: Secondary | ICD-10-CM | POA: Diagnosis not present

## 2018-12-16 DIAGNOSIS — I509 Heart failure, unspecified: Secondary | ICD-10-CM | POA: Diagnosis not present

## 2018-12-16 DIAGNOSIS — N319 Neuromuscular dysfunction of bladder, unspecified: Secondary | ICD-10-CM | POA: Diagnosis not present

## 2018-12-16 DIAGNOSIS — D649 Anemia, unspecified: Secondary | ICD-10-CM | POA: Diagnosis not present

## 2018-12-16 DIAGNOSIS — Z853 Personal history of malignant neoplasm of breast: Secondary | ICD-10-CM | POA: Diagnosis not present

## 2018-12-16 DIAGNOSIS — G8250 Quadriplegia, unspecified: Secondary | ICD-10-CM | POA: Diagnosis not present

## 2018-12-16 DIAGNOSIS — Z9981 Dependence on supplemental oxygen: Secondary | ICD-10-CM | POA: Diagnosis not present

## 2018-12-17 ENCOUNTER — Ambulatory Visit: Payer: PPO | Admitting: Cardiology

## 2018-12-17 DIAGNOSIS — I5033 Acute on chronic diastolic (congestive) heart failure: Secondary | ICD-10-CM | POA: Diagnosis not present

## 2018-12-20 ENCOUNTER — Ambulatory Visit: Payer: PPO | Admitting: Cardiovascular Disease

## 2018-12-23 DIAGNOSIS — E039 Hypothyroidism, unspecified: Secondary | ICD-10-CM | POA: Diagnosis not present

## 2018-12-23 DIAGNOSIS — Z23 Encounter for immunization: Secondary | ICD-10-CM | POA: Diagnosis not present

## 2018-12-23 DIAGNOSIS — E559 Vitamin D deficiency, unspecified: Secondary | ICD-10-CM | POA: Diagnosis not present

## 2018-12-23 DIAGNOSIS — I10 Essential (primary) hypertension: Secondary | ICD-10-CM | POA: Diagnosis not present

## 2018-12-23 DIAGNOSIS — E119 Type 2 diabetes mellitus without complications: Secondary | ICD-10-CM | POA: Diagnosis not present

## 2018-12-23 DIAGNOSIS — Z1331 Encounter for screening for depression: Secondary | ICD-10-CM | POA: Diagnosis not present

## 2018-12-23 DIAGNOSIS — Z1339 Encounter for screening examination for other mental health and behavioral disorders: Secondary | ICD-10-CM | POA: Diagnosis not present

## 2018-12-23 DIAGNOSIS — Z0000 Encounter for general adult medical examination without abnormal findings: Secondary | ICD-10-CM | POA: Diagnosis not present

## 2018-12-30 ENCOUNTER — Other Ambulatory Visit: Payer: Self-pay

## 2018-12-30 ENCOUNTER — Encounter (HOSPITAL_BASED_OUTPATIENT_CLINIC_OR_DEPARTMENT_OTHER): Payer: PPO | Attending: Internal Medicine

## 2018-12-30 DIAGNOSIS — L89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, stage 4: Secondary | ICD-10-CM | POA: Diagnosis not present

## 2018-12-30 DIAGNOSIS — I10 Essential (primary) hypertension: Secondary | ICD-10-CM | POA: Diagnosis not present

## 2018-12-30 DIAGNOSIS — Z96652 Presence of left artificial knee joint: Secondary | ICD-10-CM | POA: Diagnosis not present

## 2018-12-30 DIAGNOSIS — E119 Type 2 diabetes mellitus without complications: Secondary | ICD-10-CM | POA: Diagnosis not present

## 2018-12-30 DIAGNOSIS — G8220 Paraplegia, unspecified: Secondary | ICD-10-CM | POA: Diagnosis not present

## 2018-12-30 DIAGNOSIS — L97409 Non-pressure chronic ulcer of unspecified heel and midfoot with unspecified severity: Secondary | ICD-10-CM | POA: Diagnosis not present

## 2018-12-30 DIAGNOSIS — Z923 Personal history of irradiation: Secondary | ICD-10-CM | POA: Diagnosis not present

## 2018-12-30 DIAGNOSIS — Z87891 Personal history of nicotine dependence: Secondary | ICD-10-CM | POA: Insufficient documentation

## 2018-12-31 DIAGNOSIS — L97409 Non-pressure chronic ulcer of unspecified heel and midfoot with unspecified severity: Secondary | ICD-10-CM | POA: Diagnosis not present

## 2019-01-04 DIAGNOSIS — R8290 Unspecified abnormal findings in urine: Secondary | ICD-10-CM | POA: Diagnosis not present

## 2019-01-05 ENCOUNTER — Ambulatory Visit: Payer: PPO | Admitting: Cardiology

## 2019-01-06 DIAGNOSIS — M47816 Spondylosis without myelopathy or radiculopathy, lumbar region: Secondary | ICD-10-CM | POA: Diagnosis not present

## 2019-01-06 DIAGNOSIS — M4712 Other spondylosis with myelopathy, cervical region: Secondary | ICD-10-CM | POA: Diagnosis not present

## 2019-01-06 DIAGNOSIS — G894 Chronic pain syndrome: Secondary | ICD-10-CM | POA: Diagnosis not present

## 2019-01-06 DIAGNOSIS — F329 Major depressive disorder, single episode, unspecified: Secondary | ICD-10-CM | POA: Diagnosis not present

## 2019-01-13 ENCOUNTER — Other Ambulatory Visit: Payer: Self-pay

## 2019-01-13 ENCOUNTER — Encounter (HOSPITAL_BASED_OUTPATIENT_CLINIC_OR_DEPARTMENT_OTHER): Payer: PPO | Attending: Internal Medicine | Admitting: Internal Medicine

## 2019-01-13 ENCOUNTER — Other Ambulatory Visit (HOSPITAL_COMMUNITY)
Admission: RE | Admit: 2019-01-13 | Discharge: 2019-01-13 | Disposition: A | Discharge status: Home / Self Care | Payer: PPO | Source: Other Acute Inpatient Hospital | Attending: Internal Medicine | Admitting: Internal Medicine

## 2019-01-13 DIAGNOSIS — F0390 Unspecified dementia without behavioral disturbance: Secondary | ICD-10-CM | POA: Diagnosis not present

## 2019-01-13 DIAGNOSIS — Z923 Personal history of irradiation: Secondary | ICD-10-CM | POA: Insufficient documentation

## 2019-01-13 DIAGNOSIS — E119 Type 2 diabetes mellitus without complications: Secondary | ICD-10-CM | POA: Insufficient documentation

## 2019-01-13 DIAGNOSIS — I10 Essential (primary) hypertension: Secondary | ICD-10-CM | POA: Insufficient documentation

## 2019-01-13 DIAGNOSIS — L89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, stage 4: Secondary | ICD-10-CM | POA: Diagnosis not present

## 2019-01-13 DIAGNOSIS — G8220 Paraplegia, unspecified: Secondary | ICD-10-CM | POA: Insufficient documentation

## 2019-01-13 NOTE — Progress Notes (Addendum)
[name], [name] ([id number]) Visit Report for 01/13/2019 Arrival Information Details Patient Name: Date of Service: [name], [name] 01/13/2019 10:15 AM Medical Record Number:[id number] Patient Account Number: [id number] Date of Birth/Sex: Treating RN: Aug 20, 1950 (68 y.o. [name] Primary Care Kellsey Sansone: [name] Other Clinician: Referring Aman Batley: Treating Rhodie Cienfuegos/Extender:[name], [name], [name] in Treatment: 2 Visit Information History Since Last Visit Added or deleted any medications: No Patient Arrived: Wheel Chair Any new allergies or adverse reactions: No Arrival Time: 10:11 Had a fall or experienced change in No activities of daily living that may affect Accompanied By: husband risk of falls: Transfer Assistance: Other Signs or symptoms of abuse/neglect since last visito No Patient Identification Verified: Yes Hospitalized since last visit: No Secondary Verification Process Completed: Yes Implantable device outside of the clinic excluding No Patient Requires Transmission-Based No cellular tissue based products placed in the center Precautions: since last visit: Patient Has Alerts: No Pain Present Now: No Electronic Signature(s) Signed: 01/13/2019 4:29:59 PM By: Kela Millin Entered By: Kela Millin on 01/13/2019 10:13:04 -------------------------------------------------------------------------------- Encounter Discharge Information Details Patient Name: Date of Service: NANIE, [name] 01/13/2019 10:15 AM Medical Record [id number] Patient Account Number: [id number] Date of Birth/Sex: Treating RN: 1951-01-10 (68 y.o. [name] Primary Care Riona Lahti: [name] Other Clinician: Referring Meyer Arora: Treating Doyne Micke/Extender:[name], [name], [name] in Treatment: 2 Encounter Discharge Information Items Post Procedure Vitals Discharge Condition: Stable Temperature (F): 98.1 Ambulatory Status: Wheelchair Pulse  (bpm): 87 Discharge Destination: Home Respiratory Rate (breaths/min): 17 Transportation: Private Auto Blood Pressure (mmHg): 127/77 Accompanied By: husband Schedule Follow-up Appointment: Yes Clinical Summary of Care: Electronic Signature(s) Signed: 01/13/2019 4:29:59 PM By: Kela Millin Entered By: Kela Millin on 01/13/2019 11:51:24 -------------------------------------------------------------------------------- Lower Extremity Assessment Details Patient Name: Date of Service: CHANELE, [name] 01/13/2019 10:15 AM Medical Record [id number] Patient Account Number: [id number] Date of Birth/Sex: Treating RN: 17-Oct-1950 (68 y.o. [name] Primary Care Jadah Bobak: [name] Other Clinician: Referring Kaya Klausing: Treating Shelia Magallon/Extender:[name], [name], [name] in Treatment: 2 Electronic Signature(s) Signed: 01/13/2019 4:29:59 PM By: Kela Millin Entered By: Kela Millin on 01/13/2019 10:16:48 -------------------------------------------------------------------------------- Multi Wound Chart Details Patient Name: Date of Service: [name], [name] 01/13/2019 10:15 AM Medical Record [id number] Patient Account Number: [id number] Date of Birth/Sex: Treating RN: 11/16/1950 (68 y.o. [name], [name] Primary Care Cordell Coke: [name] Other Clinician: Referring Edward Trevino: Treating Jeffie Widdowson/Extender:[name], [name], [name] in Treatment: 2 Vital Signs Height(in): 80 Pulse(bpm): 51 Weight(lbs): 185 Blood Pressure(mmHg): 127/77 Body Mass Index(BMI): 33 Temperature(F): 98.1 Respiratory 17 Rate(breaths/min): Photos: [12:No Photos] [N/A:N/A] Wound Location: [12:Left Ischial Tuberosity] [N/A:N/A] Wounding Event: [12:Pressure Injury] [N/A:N/A] Primary Etiology: [12:Pressure Ulcer] [N/A:N/A] Comorbid History: [12:Anemia, Hypertension, TypeN/A II Diabetes, Osteoarthritis, Dementia, Paraplegia, Received Radiation] Date Acquired: [12:09/06/2018]  [N/A:N/A] Weeks of Treatment: [12:2] [N/A:N/A] Wound Status: [12:Open] [N/A:N/A] Measurements [name] x W x D 1x1.6x1 [N/A:N/A] (cm) Area (cm) : [12:1.257] [N/A:N/A] Volume (cm) : [12:1.257] [N/A:N/A] % Reduction in Area: [12:-432.60%] [N/A:N/A] % Reduction in Volume: -492.90% [N/A:N/A] Classification: [12:Category/Stage IV] [N/A:N/A] Exudate Amount: [12:Small] [N/A:N/A] Exudate Type: [12:Serosanguineous] [N/A:N/A] Exudate Color: [12:red, brown] [N/A:N/A] Wound Margin: [12:Well defined, not attached] [N/A:N/A] Granulation Amount: [12:Medium (34-66%)] [N/A:N/A] Granulation Quality: [12:Pink] [N/A:N/A] Necrotic Amount: [12:Medium (34-66%)] [N/A:N/A] Exposed Structures: [12:Fat Layer (Subcutaneous Tissue) Exposed: Yes Fascia: No Tendon: No Muscle: No Joint: No Bone: No] [N/A:N/A] Epithelialization: [12:None] [N/A:N/A] Debridement: [12:Debridement - Excisional] [N/A:N/A] Pre-procedure [12:11:29] [N/A:N/A] Verification/Time Out Taken: Pain Control: [12:Lidocaine 5% topical ointment] [N/A:N/A] Tissue Debrided: [12:Bone, Subcutaneous, Slough] [N/A:N/A] Level: [12:Skin/Subcutaneous Tissue/Muscle/Bone] [N/A:N/A] Debridement Area (sq cm):1.6 [N/A:N/A] Instrument: [12:Curette] [N/A:N/A]  Bleeding: [12:Moderate] [N/A:N/A] Hemostasis Achieved: [12:Pressure] [N/A:N/A] Procedural Pain: [12:0] [N/A:N/A] Post Procedural Pain: [12:2] [N/A:N/A] Debridement Treatment Procedure was tolerated [N/A:N/A] Response: [12:well] Post Debridement [12:1x1.6x1] [N/A:N/A] Measurements [name] x W x D (cm) Post Debridement [12:1.257] [N/A:N/A] Volume: (cm) Post Debridement Stage: Category/Stage IV [N/A:N/A N/A] Treatment Notes Electronic Signature(s) Signed: 01/13/2019 5:49:31 PM By: Linton Ham MD Signed: 01/13/2019 6:06:48 PM By: Deon Pilling Entered By: Linton Ham on 01/13/2019 11:36:15 -------------------------------------------------------------------------------- Multi-Disciplinary Care Plan  Details Patient Name: Date of Service: [name], [name] 01/13/2019 10:15 AM Medical Record [id number] Patient Account Number: [id number] Date of Birth/Sex: Treating RN: [birth year] (68 y.o. [name], [name] Primary Care Teeghan Hammer: [name] Other Clinician: Referring Caeli Linehan: Treating Arnez Stoneking/Extender:[name], [name], [name] in Treatment: 2 Active Inactive Abuse / Safety / Falls / Self Care Management Nursing Diagnoses: Impaired physical mobility Potential for falls Goals: Patient will remain injury free related to falls Date Initiated: 12/30/2018 Target Resolution Date: 02/18/2019 Goal Status: Active Patient/caregiver will verbalize/demonstrate measure taken to improve self care Date Inactivated: 01/13/2019 Target10/22/2020 Resolution Date Initiated: 12/30/2018 Date: Goal Status: Met Interventions: Assess fall risk on admission and as needed Provide education on fall prevention Notes: Wound/Skin Impairment Nursing Diagnoses: Knowledge deficit related to ulceration/compromised skin integrity Goals: Patient/caregiver will verbalize understanding of skin care regimen Date Initiated: 12/30/2018 Target Resolution Date: 02/04/2019 Goal Status: Active Interventions: Assess patient/caregiver ability to obtain necessary supplies Assess patient/caregiver ability to perform ulcer/skin care regimen upon admission and as needed Assess ulceration(s) every visit Provide education on ulcer and skin care Treatment Activities: Skin care regimen initiated : 12/30/2018 Topical wound management initiated : 12/30/2018 Notes: Electronic Signature(s) Signed: 01/13/2019 6:06:48 PM By: Deon Pilling Entered By: Deon Pilling on 01/13/2019 11:09:03 -------------------------------------------------------------------------------- Pain Assessment Details Patient Name: Date of Service: LILIENNE, [name] 01/13/2019 10:15 AM Medical Record [id number] Patient Account Number:  [id number] Date of Birth/Sex: Treating RN: 1951-03-09 (68 y.o. [name] Primary Care Vivien Barretto: [name] Other Clinician: Referring Eviana Sibilia: Treating Yago Ludvigsen/Extender:[name], [name], [name] in Treatment: 2 Active Problems Location of Pain Severity and Description of Pain Patient Has Paino No Site Locations Pain Management and Medication Current Pain Management: Electronic Signature(s) Signed: 01/13/2019 4:29:59 PM By: Kela Millin Entered By: Kela Millin on 01/13/2019 10:13:49 -------------------------------------------------------------------------------- Patient/Caregiver Education Details Patient Name: Date of Service: Betha Loa 10/8/2020andnbsp10:15 AM Medical Record [id number] Patient Account Number: [id number] Date of Birth/Gender: Treating RN: [birth year] (68 y.o. [name], [name] Primary Care Physician: [name] Other Clinician: Referring Physician: Treating Physician/Extender:[name], [name], [name] in Treatment: 2 Education Assessment Education Provided To: Patient Education Topics Provided Safety: Handouts: Medication Safety Methods: Explain/Verbal Responses: Reinforcements needed Electronic Signature(s) Signed: 01/13/2019 6:06:48 PM By: Deon Pilling Entered By: Deon Pilling on 01/13/2019 11:09:13 -------------------------------------------------------------------------------- Wound Assessment Details Patient Name: Date of Service: [name], [name] 01/13/2019 10:15 AM Medical Record [id number] Patient Account Number: [id number] Date of Birth/Sex: Treating RN: 05-Jul-1950 (68 y.o. [name] Primary Care Mckell Riecke: [name] Other Clinician: Referring Itzayanna Kaster: Treating Nicola Quesnell/Extender:[name], [name], [name] in Treatment: 2 Wound Status Wound Number: 12 Primary Pressure Ulcer Etiology: Wound Location: Left Ischial Tuberosity Wound Open Wounding Event: Pressure  Injury Status: Date Acquired: 09/06/2018 Comorbid Anemia, Hypertension, Type II Diabetes, Weeks Of Treatment: 2 History: Osteoarthritis, Dementia, Paraplegia, Received Clustered Wound: No Radiation Photos Wound Measurements Length: (cm) 1 % Reductio Width: (cm) 1.6 % Reductio Depth: (cm) 1 Epithelial Area: (cm) 1.257 Tunneling Volume: (cm) 1.257 Undermini Wound Description Classification: Category/Stage IV Wound Margin: Well defined, not attached Exudate Amount: Small Exudate  Type: Serosanguineous Exudate Color: red, brown Wound Bed Granulation Amount: Medium (34-66%) Granulation Quality: Pink Necrotic Amount: Medium (34-66%) Necrotic Quality: Adherent Slough Foul Odor After Cleansing: No Slough/Fibrino Yes Exposed Structure Fascia Exposed: No Fat Layer (Subcutaneous Tissue) Exposed: Yes Tendon Exposed: No Muscle Exposed: No Joint Exposed: No Bone Exposed: No n in Area: -432.6% n in Volume: -492.9% ization: None : No ng: No Treatment Notes Wound #12 (Left Ischial Tuberosity) 1. Cleanse With Wound Cleanser 2. Periwound Care Barrier cream Skin Prep 3. Primary Dressing Applied Calcium Alginate Ag 4. Secondary Dressing Dry Gauze Foam Border Dressing Electronic Signature(s) Signed: 01/14/2019 5:13:27 PM By: Kela Millin Signed: 01/17/2019 3:43:33 PM By: Mikeal Hawthorne EMT/HBOT Previous Signature: 01/13/2019 4:29:59 PM Version By: Kela Millin Entered By: Mikeal Hawthorne on 01/14/2019 08:30:55 -------------------------------------------------------------------------------- Vitals Details Patient Name: Date of Service: BRIEL, GALLICCHIO 01/13/2019 10:15 AM Medical Record [id number] Patient Account Number: [id number] Date of Birth/Sex: Treating RN: [birth year] (68 y.o. [name] Primary Care Coulter Oldaker: [name] Other Clinician: Referring Everest Brod: Treating Milka Windholz/Extender:[name], [name], [name] in Treatment: 2 Vital  Signs Time Taken: 10:13 Temperature (F): 98.1 Height (in): 63 Pulse (bpm): 87 Weight (lbs): 185 Respiratory Rate (breaths/min): 17 Body Mass Index (BMI): 32.8 Blood Pressure (mmHg): 127/77 Reference Range: 80 - 120 mg / dl Electronic Signature(s) Signed: 01/13/2019 4:29:59 PM By: Kela Millin Entered By: Kela Millin on 01/13/2019 10:13:43

## 2019-01-13 NOTE — Progress Notes (Signed)
DASHAI, BIESE ([id number]) Visit Report for 12/30/2018 Abuse/Suicide Risk Screen Details Patient Name: Date of Service: [name], [name] 12/30/2018 9:00 AM Medical Record Number:[id number] Patient Account Number: [id number] Date of Birth/Sex: Treating RN: [birth year] (68 y.o. [name] Primary Care [name]: [name] Other Clinician: Referring [name]: Treating [name]/Extender:[name], [name], [name] in Treatment: 0 Abuse/Suicide Risk Screen Items Answer ABUSE RISK SCREEN: Has anyone close to you tried to hurt or harm you recentlyo No Do you feel uncomfortable with anyone in your familyo No Has anyone forced you do things that you didnt want to doo No Electronic Signature(s) Signed: 01/13/2019 4:32:31 PM By: Kela Millin Entered By: Kela Millin on 12/30/2018 09:48:35 -------------------------------------------------------------------------------- Activities of Daily Living Details Patient Name: Date of Service: [name], [name] 12/30/2018 9:00 AM Medical Record Number:[id number] Patient Account Number: [id number] Date of Birth/Sex: Treating RN: [birth year] (68 y.o. [name] Primary Care [name]: [name] Other Clinician: Referring [name]: Treating [name]/Extender:[name], [name], [name] in Treatment: 0 Activities of Daily Living Items Answer Activities of Daily Living (Please select one for each item) Drive Automobile Not Able Take Medications Need Assistance Use Telephone Need Assistance Care for Appearance Need Assistance Use Toilet Need Assistance Bath / Shower Need Assistance Dress Self Need Assistance Feed Self Completely Able Walk Not Able Get In / Out Bed Need Assistance Housework Need Assistance Prepare Meals Need Assistance Handle Money Need Assistance Shop for Self Need Assistance Electronic Signature(s) Signed: 01/13/2019 4:32:31 PM By: Kela Millin Entered By: Kela Millin on 12/30/2018  09:48:40 -------------------------------------------------------------------------------- Education Screening Details Patient Name: Date of Service: [name], [name] 12/30/2018 9:00 AM Medical Record [id number] Patient Account Number: [id number] Date of Birth/Sex: Treating RN: Oct 15, 1950 (68 y.o. [name] Primary Care Cathan Gearin: [name] Other Clinician: Referring [name]: Treating [name]/Extender:[name], [name], [name] in Treatment: 0 Primary Learner Assessed: Patient Learning Preferences/Education Level/Primary Language Learning Preference: Explanation, Demonstration Highest Education Level: College or Above Preferred Language: English Cognitive Barrier Language Barrier: No Translator Needed: No Memory Deficit: No Emotional Barrier: No Cultural/Religious Beliefs Affecting Medical Care: No Physical Barrier Impaired Vision: No Impaired Hearing: No Decreased Hand dexterity: No Knowledge/Comprehension Knowledge Level: High Comprehension Level: High Ability to understand written High instructions: Ability to understand verbal High instructions: Motivation Anxiety Level: Calm Cooperation: Cooperative Education Importance: Acknowledges Need Interest in Health Problems: Asks Questions Perception: Coherent Willingness to Engage in Self- High Management Activities: Readiness to Engage in Self- High Management Activities: Electronic Signature(s) Signed: 01/13/2019 4:32:31 PM By: Kela Millin Entered By: Kela Millin on 12/30/2018 09:32:07 -------------------------------------------------------------------------------- Fall Risk Assessment Details Patient Name: Date of Service: [name], [name] 12/30/2018 9:00 AM Medical Record [id number] Patient Account Number: [id number] Date of Birth/Sex: Treating RN: [birth year] (68 y.o. [name] Primary Care [name]: [name] Other Clinician: Referring [name]: Treating  [name]/Extender:[name], [name], [name] in Treatment: 0 Fall Risk Assessment Items Have you had 2 or more falls in the last 12 monthso 0 No Have you had any fall that resulted in injury in the last 12 monthso 0 No FALLS RISK SCREEN History of falling - immediate or within 3 months 0 No Secondary diagnosis (Do you have 2 or more medical diagnoseso) 0 No Ambulatory aid None/bed rest/wheelchair/nurse 0 Yes Crutches/cane/walker 0 No Furniture 0 No Intravenous therapy Access/Saline/Heparin Lock 0 No Weak (short steps with or without shuffle, stooped but able to lift head 0 No while walking, may seek support from furniture) Impaired (short steps with shuffle, may have difficulty arising from chair, 0 No head  down, impaired balance) Mental Status Oriented to own ability 0 Yes Overestimates or forgets limitations 0 No Risk Level: Low Risk Score: 0 Electronic Signature(s) Signed: 01/13/2019 4:32:31 PM By: Kela Millin Entered By: Kela Millin on 12/30/2018 09:32:59 -------------------------------------------------------------------------------- Foot Assessment Details Patient Name: Date of Service: [name], [name] 12/30/2018 9:00 AM Medical Record [id number] Patient Account Number: [id number] Date of Birth/Sex: Treating RN: 1951/03/16 (68 y.o. [name] Primary Care [name]: [name] Other Clinician: Referring [name]: Treating [name]/Extender:[name], [name], [name] in Treatment: 0 Foot Assessment Items Site Locations + = Sensation present, - = Sensation absent, C = Callus, U = Ulcer R = Redness, W = Warmth, M = Maceration, PU = Pre-ulcerative lesion F = Fissure, S = Swelling, D = Dryness Assessment Right: Left: Other Deformity: No No Prior Foot Ulcer: No No Prior Amputation: No No Charcot Joint: No No Ambulatory Status: Gait: Electronic Signature(s) Signed: 01/13/2019 4:32:31 PM By: Kela Millin Entered By: Kela Millin on 12/30/2018 09:34:36 -------------------------------------------------------------------------------- Nutrition Risk Screening Details Patient Name: Date of Service: [name], [name] 12/30/2018 9:00 AM Medical Record Number:[id number] Patient Account Number: [id number] Date of Birth/Sex: Treating RN: April 08, 1950 (68 y.o. [name] Primary Care Rupal Childress: [name] Other Clinician: Referring Berklie Dethlefs: Treating Davanta Meuser/Extender:[name], [name], [name] in Treatment: 0 Height (in): 63 Weight (lbs): 185 Body Mass Index (BMI): 32.8 Nutrition Risk Screening Items Score Screening NUTRITION RISK SCREEN: I have an illness or condition that made me change the kind and/or 0 No amount of food I eat I eat fewer than two meals per day 0 No I eat few fruits and vegetables, or milk products 0 No I have three or more drinks of beer, liquor or wine almost every day 0 No I have tooth or mouth problems that make it hard for me to eat 0 No I don't always have enough money to buy the food I need 0 No I eat alone most of the time 0 No I take three or more different prescribed or over-the-counter drugs a day 1 Yes 0 No Without wanting to, I have lost or gained 10 pounds in the last six months I am not always physically able to shop, cook and/or feed myself 2 Yes Nutrition Protocols Good Risk Protocol Provide education on elevated blood sugars and Moderate Risk Protocol 0 impact on wound healing, as applicable High Risk Proctocol Risk Level: Moderate Risk Score: 3 Electronic Signature(s) Signed: 01/13/2019 4:32:31 PM By: Kela Millin Entered By: Kela Millin on 12/30/2018 09:34:30

## 2019-01-13 NOTE — Progress Notes (Signed)
MAIRIM, BADE ([id number]) Visit Report for 12/30/2018 Chief Complaint Document Details Patient Name: Date of Service: [name], [name] 12/30/2018 9:00 AM Medical Record Number:[id number] Patient Account Number: [id number] Date of Birth/Sex: Treating RN: 07/01/1950 (68 y.o. [name], [name] Primary Care Provider: [name] Other Clinician: Referring Provider: Treating Provider/Extender:Ruben Pyka, [name], [name] in Treatment: 0 Information Obtained from: Patient Chief Complaint She is here for follow up evaluation of left ischial pressure ulcer 12/30/2018; patient comes back for review of wounds in the same general area over the previously healed left ischial pressure ulcer Electronic Signature(s) Signed: 12/31/2018 8:02:37 AM By: Linton Ham MD Entered By: Linton Ham on 12/30/2018 09:55:48 -------------------------------------------------------------------------------- Debridement Details Patient Name: Date of Service: [name], [name] 12/30/2018 9:00 AM Medical Record [id number] Patient Account Number: [id number] Date of Birth/Sex: Treating RN: 23-May-1950 (68 y.o. [name], [name] Primary Care Provider: [name] Other Clinician: Referring Provider: Treating Provider/Extender:Bekah Igoe, [name], [name] in Treatment: 0 Debridement Performed for Wound #12 Left Ischial Tuberosity Assessment: Performed By: Physician Ricard Dillon., MD Debridement Type: Debridement Level of Consciousness (Pre- Awake and Alert procedure): Pre-procedure Verification/Time Out Taken: Yes - 09:42 Start Time: 09:43 Pain Control: Lidocaine 4% Topical Solution Total Area Debrided (L x W): 0.3 (cm) x 1 (cm) = 0.3 (cm) Tissue and other material Non-Viable, Subcutaneous, Fibrin/Exudate debrided: Level: Skin/Subcutaneous Tissue Debridement Description: Excisional Instrument: Curette Bleeding: Moderate Hemostasis Achieved: Pressure End Time: 09:45 Procedural Pain: 0 Post  Procedural Pain: 0 Response to Treatment: Procedure was tolerated well Level of Consciousness Awake and Alert (Post-procedure): Post Debridement Measurements of Total Wound Length: (cm) 0.3 Stage: Category/Stage IV Width: (cm) 1 Depth: (cm) 0.9 Volume: (cm) 0.212 Character of Wound/Ulcer Post Improved Debridement: Post Procedure Diagnosis Same as Pre-procedure Electronic Signature(s) Signed: 12/30/2018 7:07:15 PM By: Deon Pilling Signed: 12/31/2018 8:02:37 AM By: Linton Ham MD Entered By: Linton Ham on 12/30/2018 09:55:20 -------------------------------------------------------------------------------- HPI Details Patient Name: Date of Service: [name], [name] 12/30/2018 9:00 AM Medical Record [id number] Patient Account Number: [id number] Date of Birth/Sex: Treating RN: 10-Oct-1950 (68 y.o. [name] Primary Care Provider: [name] Other Clinician: Referring Provider: Treating Provider/Extender:Mika Anastasi, [name], [name] in Treatment: 0 History of Present Illness Location: open ulceration of the left gluteal area, left heel and right ankle for about 5 months. Quality: Patient reports No Pain. Severity: Patient states wound(s) are getting worse. Duration: Patient has had the wound for > 5 months prior to seeking treatment at the wound center Context: The wound occurred when the patient has been paraplegic for about 3 years. Modifying Factors: Wound improving due to current treatment. Associated Signs and Symptoms: Patient reports having foul odor. HPI Description: this 68 year old patient who is known to have hypertension, hypothyroidism, breast cancer, chronic pain syndrome, paraplegia was noted to have a left gluteal decubitus ulcer and was brought into the hospital. During the course of her hospitalization she was debrided in the operating room by Dr. Leland Johns and had all the wounds sharply debrided. The debridement was done for the left ischial  wound, the left heel wound and the right ankle wound. Bone cultures were taken at that time but were negative but clinically she was treated for osteomyelitis because of the probing down to bone and open exposed bone. Home health has been giving her antibioticss which include vancomycin and Zosyn. The patient was a smoker until about 3 weeks ago and used to smoke about 10 cigarettes a day for a long while. 12/13/2014 - details of her operative note from 11/03/2014  were reviewed -- PROCEDURE: 1. Excisional debridement skin, subcutaneous, muscle left ischium 35 cm2 2. Excisional debridement skin, subcutaneous tissue left heel 27 cm2 3. Excisional debridement right ankle skin, subcutaneous, bone 30 cm2 01/24/2015 -- she has some issues with her wheelchair cushion but other than that is doing very well and has received Podus boots for her feet. 02/14/2015 -- she was using her old offloading boots and this seemed to have caused her a new pressure ulcer on the left posterior heel near the superior part just below the Achilles tendon. 03/07/2015 -- she has a new ulceration just to the left of the midline on her sacral region more on the left buttock and this has been there for about a week. 08/22/2015 -- was recently admitted to hospital between May 5 and 08/13/2015, with sepsis and leukocytosis due to a UTI. she was treated for a sepsis complicating Escherichia coli UTI and kidney stones. She also had metabolic and careful up at the secondary to pyelonephritis. He received broad-spectrum antibiotics initially and then received Macrobid as per urology. She was sent home on nitrofurantoin. during her admission she had a CT scan which showed exposed left ischial tuberosity without evidence of osteolysis. 09/12/2015-- the patient is having some issues with her air mattress and would like to get a opinion from medical modalities. 10/10/2015 -- the issue with her air mattress has not yet been sorted out and  the new problem seems to be a lot of odor from the wound VAC. 11/27/2015 -- the patient was admitted to the hospital between July 23 and 10/31/2015. Her problems were sepsis, osteomyelitis of the pelvic bone and acute pyelonephritis. CT of the abdomen and pelvis was consistent with a left- sided pyelonephritis with hydronephrosis and also just showed new sclerosis of the posterior portion of the left anterior pubic ramus suggestive of periosteal reaction consistent with osteomyelitis. She was treated for the osteomyelitis with infectious disease consult recommending 6 weeks of IV antibiotics including vancomycin and Rocephin and the antibiotics were to go on until 12/10/2015. He was seen by Dr. Iran Planas plastic surgery and Dr. Linus Salmons of infectious disease. She had a suprapubic catheter placed during the admission. CT scan done on 10/28/2015 showed specifically -- New sclerosis of the posterior portion of the left inferior pubic ramus with aggressive periosteal reaction, consistent with osteomyelitis, with adjacent soft tissue gas compatible with previously described decubitus ulcer. 12/12/2015 -- she was recently seen by Dr. Linus Salmons, who noted good improvement and CRP and ESR compared to before and he has stopped her antibiotic as per plans to finish on September 4. The patient was encouraged to continue with wound care and consider hyperbaric oxygen therapy. Today she tells me that she has consented to undergo hyperbaric oxygen therapy and we can start the paperwork. 01/02/2016 -- her PCP had gained about 3 years but she still persists in having problems during hyperbaric oxygen therapy with some discomfort in the ears. 01/09/16; pressure area with underlying osteomyelitis in the left buttock. Wound bed itself has some slight amount of grayish surface slough however I do not think any debridement was necessary. There is no exposed bone soft tissue appears stable. She is using a wound VAC 01/16/16;  back for weekly wound review in conjunction with HBO. She has a deep wound over the left initial tuberosity previously treated with 6 weeks of IV antibiotics for osteomyelitis. Wound bed looks reasonably healthy although the base of this is still precariously close to bone. She has been  using a wound VAC. 01/23/2016 -- she has completed her course of antibiotics and this week the only new thing is her right great toe nail was avulsed and she has got an open wound over the nailbed. 01/31/16 she has completed her course of antibiotics. Her right great toenail avulsed last week and she's been using silver alginate for this as well. Still using a wound VAC to the substantial stage IV wound over the left ischial tuberosity 03/05/2016 -- the patient has had a opinion from the plastic surgery group at [facility] and details of this are not available yet but the patient's verbal report has been heard by me. Did not sound like there was any optimistic discussion regarding reconstruction and the net result would be to continue with the wound VAC application. I will await the official reports. Addendum: -- she was seen at [place] surgery service by Dr. Tressa Busman. After a thorough review and from what I understand spending 45 minutes with the patient his assessment has been noted by me in detail and the management options were: 1. Continued pressure offloading and wound care versus operative procedures including wound excision 2. Soft tissue and bone sampling 3. If the wound gets larger wound closure would be done using a variety of plastic surgical techniques including but not limited to skin substitute, possible skin graft, local versus regional flaps, negative pressure dressing application. 4. He discussed with her details of flap surgery and the risks associated 5. He made a comment that since the patient was operated on by Dr. Leland Johns of [facility]  plastic surgery unit in [place] the patient may continue to follow-up there for further evaluation for surgical flap closure in the future. 03/19/2016 -- the patient continues to be rather depressed and frustrated with her lack of rapid progress in healing this wound especially because she thought after hyperbaric oxygen therapy the wound would heal extremely fast. She now understands that was not the implied benefit on wound care which was the recommendation for hyperbaric oxygen therapy. I have had a lengthy discussion with the patient and her husband regarding her options: 1. Continue with collagen and wound VAC for the primary dressing and offloading and all supportive care. 2. See Dr. Iran Planas for possible placement of Acell or Integra in the OR. 3. get a second opinion from a wound care center and surrounding regions/counties 05/07/2016 -- Note from Dr. Celedonio Miyamoto, who noted that the patient has declined flap surgery. She has discussed application of A cell, and try a few applications to see how the wound progresses. She is also recommended that we could apply products here in the wound center, like Oasis. during her preop workup it was found that her hemoglobin A1c was 11% and she has now been diagnosed as having diabetes mellitus and has been put on appropriate treatment by her PCP 05/28/2016 -- tells me her blood sugars have been doing well and she has an appointment to see her PCP in the next couple of weeks to check her hemoglobin A1c. Other than that she continues to do well. 06/25/2016 -- have not seen her back for the last month but she says her health has been about the same and she has an appointment to check the A1c next week 09/10/16 ---- was seen by Dr. Celedonio Miyamoto -- who applied Acell and saw her back in follow-up. She has recommended silver alginate to the wound every other day and cover with foam.  If no significant drainage could transition to collagen every  other day. She recommended discontinuing wound VAC. There were no plans to repeat application of Acell. The patient expressed that her husband could do the wound care as going to the Wound Ctr., would cost several $100 for each visit. 10/21/2016 -- her insurance company is getting her new mattress and she is pleased about that. Other than that she has been doing dressings with PolyMem Silver and has been doing very well 02/18/2017 -- she has gone through several changes of her mattress and has not been pleased with any of them. The ventricles are still working on trying to fit her with the appropriate low air-loss mattress. She has a new wound on the gluteal area which is clearly separated from the original wound. 03/25/17-she is here in follow-up evaluation for her left ischialpressure ulcer. She remains unsatisfied with her pressure mattress. She admits to sitting multiple hours a day, in the bed. We have discussed offloading options. The wound does not appear infected. Nutrition does not appear to be a concern. Will follow-up in 4 weeks, if wound continues to be stalled may consider x-ray to evaluate for refractory osteomyelitis. 04/21/17; this is a patient that I don't know all that well. She has a chronic wound which at one point had underlying osteomyelitis in the left ischial tuberosity. This is a stage IV pressure ulcer. Over the last 3 months she has a stage II wound inferiorly to the original wound. The last time she was here her dressing was changed to silver collagen although the patient's husband who changes the dressing said that the collagen stuck to the wound and remove skin from the superficial area therefore he switched back to Mifflin 05/13/17; this is a patient we've been following for a left ischial tuberosity wound which was stage IV at one point had underlying osteomyelitis. Over the last several months she's had a stage II wound just inferior and medial to the related to  the wound. According to her husband he is using Endoform layer with collagen although this is not what I had last time. According to her husband they are using Elgie Congo with collagen although I don't quite know how that started. She was hospitalized from 1/20 through 04/30/16. This was related to a UTI. Her blood cultures were negative, urine culture showed multiple species. She did have a CT scan of the abdomen and pelvis which documented chronic osteomyelitis in the area of the wound inflammatory markers were unremarkable. She has had prior knowledge of osteomyelitis. It looks as though she received IV antibiotics in 2017 and was treated with a course of hyperbaric oxygen. 05/28/17; the wound over the left ischial tuberosity is deeper today and abuts clearly on bone. Nursing intake reported drainage. I therefore culture of the wound. The more superficial area just below this looks about the same. They once again complained that there are mattress cover is not working although apparently [facility] is been noted to see this many times in the report is that the device is functional 06/18/17; the patient had a probing area on the left ischial tuberosity that was draining purulent fluid last time. This also clearly seemed to have open bone. Culture I did showed pansensitive pseudomonas including third generation cephalosporins. I treated this with cefdinir 300 twice a day for 10 days and things seem to have improved. She has a more superficial wound just underneath this area. Amazingly she has a new air mattress through  [facility]. I think they gave this to her as a parking give. In any case this now works according to the patient may have something to do with why the areas are looking better. 07/09/17; the patient has a probing area in the left ischial tuberosity that still has some depth. However this is contracted in terms of the wound orifice although the depth is still roughly  the same. There is no undermining. She also has the satellite wound which is more superficial. This appears to have a healthy surface we've been using silver collagen 08/06/17; the patient's wound is over the left ischial tuberosity and a satellite lesion just underneath this. The original wound was actually a deep stage 4 wound. We have made good progress in 2 months and there is no longer exposed bone here. 09/03/17; left ischial tuberosity actually appears to be quite healthy. I think we are making progress. No debridement is required. There is no surrounding erythema 10/01/17 I follow this patient monthly for her left ischial tuberosity wound. There is 2 areas the original area and a satellite area. The satellite area looks a lot better there is no surrounding erythema. Her husband relates that he is having trouble maintaining the dressing. This has to do with the soft tissue around it. He states he puts the collagen in but he cannot make sure that it stays in even with the ABD pads and tape that he is been using 10/29/17; patient arrives with a better looking noon today. Some of the satellite lesions have closed. using Prisma 11/26/17; the patient has a large cone-shaped area with the tip of the Cone deep within her buttock soft tissue. The walls of the Cone are epithelialized however the base is still open. The area at the base of this looks moist we've been using silver collagen. Will change to silver alginate 12/31/2017; the wound appears to have come in fairly nicely. Using silver alginate. There is no surrounding maceration or infection 01/28/18; there is still an open area here over the left initial tuberosity. Base of this however looks healthy. There is no surrounding infection 02/25/18; the area of its open is over the left ischial tuberosity. The base of this is where the wound is. This is a large inverted cone-shaped area with the wound at the tip. Dimensions of the wound at the tip  are improved. There is a area of denuded skin about halfway towards the tip which her husband thinks may have happened today when he was bathing her. 04/20/17; the area is still open over the left initial tuberosity. 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 This is now completely closed over although it is closed over with a divot and skin over bone at the base of this. Continued aggressive offloading will be necessary. 12/30/2018 READMISSION This is a 68 year old woman with chronic paraplegia. I picked her up for her care from Dr. Con Memos in this clinic after he departed. She had a stage IV pressure wound over the left ischial tuberosity. She was treated twice for her underlying osteomyelitis and this I believe firstly in 2016 and again in 2017. There were some plans at some point for flap closure of this however she was discovered to have uncontrolled diabetes and I do not think this was ever  accomplished. She ultimately healed over in this clinic and was discharged in May. She has a large cone-shaped indentation with the tip of this going towards the left ischial tuberosity. It is not an easy area to examine but at that time I thought all of this was epithelialized. Apparently there was a reopening here shortly after she left the clinic last time. She was admitted to hospital at the end of June for Klebsiella bacteremia felt to be secondary to UTI. A CT scan of the pelvis is listed below and there was initially some concern that she had underlying osteomyelitis although I believe she was seen by infectious disease and that was felt to be not the case: I do not see any new cultures or inflammatory markers IMPRESSION: 1. No CT evidence for acute intra-abdominal or pelvic abnormality. Large volume of  stool throughout the colon. 2. Enlarged fatty liver with fat sparing near the gallbladder fossa 3. Cortical scarring right kidney. Bilateral intrarenal stones without hydronephrosis. Thick-walled urinary bladder decompressed by suprapubic catheter 4. Deep left decubitus ulcer with underlying left ischial changes suggesting osteomyelitis. Her husband has been using silver collagen in the wound. She has not been systemically unwell no fever chills eating and drinking well. They rigorously offload this wound only getting up in the wheelchair when she is going to appointments the rest of the time she is in bed. Electronic Signature(s) Signed: 12/31/2018 8:02:37 AM By: Linton Ham MD Entered By: Linton Ham on 12/30/2018 10:02:56 -------------------------------------------------------------------------------- Physical Exam Details Patient Name: Date of Service: Adilynn, Bessey 12/30/2018 9:00 AM Medical Record [id number] Patient Account Number: [id number] Date of Birth/Sex: Treating RN: 1950/11/11 (68 y.o. [name] Primary Care Provider: [name] Other Clinician: Referring Provider: Treating Provider/Extender:Ambrea Hegler, [name], [name] in Treatment: 0 Constitutional Patient is hypertensive.. Pulse regular and within target range for patient.Marland Kitchen Respirations regular, non-labored and within target range.. Temperature is normal and within the target range for the patient.Marland Kitchen Appears in no distress. Eyes Conjunctivae clear. No discharge.no icterus. Respiratory work of breathing is normal. Gastrointestinal (GI) Abdomen is soft and non-distended without masses or tenderness.. Integumentary (Hair, Skin) There is no erythema around the wound. Neurological No change in neurologic status. Psychiatric appears at normal baseline. Notes Wound exam The area in question is on the left initial tuberosity. Most of this is a deep cone-shaped area that is reminiscent of the  healed area from May of this year. On the left side of the cone there is an open area that required debridement to remove fibrinous adherent debris hemostasis with direct pressure. Using my hands in the hands of my case manager we were able to look at the tip of this and I think it is still open as well. There is no clear evidence of infection even at the tip of this area no palpable bone. Electronic Signature(s) Signed: 12/31/2018 8:02:37 AM By: Linton Ham MD Entered By: Linton Ham on 12/30/2018 10:04:33 -------------------------------------------------------------------------------- Physician Orders Details Patient Name: Date of Service: Samarra, Ridgely 12/30/2018 9:00 AM Medical Record [id number] Patient Account Number: [id number] Date of Birth/Sex: Treating RN: Sep 19, 1950 (68 y.o. [name] Primary Care Provider: [name] Other Clinician: Referring Provider: Treating Provider/Extender:Avraham Benish, [name], [name] in Treatment: 0 Verbal / Phone Orders: No Diagnosis Coding Follow-up Appointments Return Appointment in 2 weeks. Dressing Change Frequency Change Dressing every other day. Skin Barriers/Peri-Wound Care Skin Prep - to periwound. Barrier cream - as needed for maceration, wetness, or redness to periwound. Wound Cleansing Wound #12  Left Ischial Tuberosity May shower and wash wound with soap and water. - or wound cleanser Primary Wound Dressing Wound #12 Left Ischial Tuberosity Calcium Alginate with Silver - back with gauze. Secondary Dressing Wound #12 Left Ischial Tuberosity ABD pad - or bordered foam. Off-Loading Low air-loss mattress (Group 2) - continue to use Gel wheelchair cushion - continue to use specialty wheelchair cushion. Turn and reposition every 2 hours Electronic Signature(s) Signed: 12/30/2018 7:07:15 PM By: Deon Pilling Signed: 12/31/2018 8:02:37 AM By: Linton Ham MD Entered By: Deon Pilling on 12/30/2018  10:04:36 -------------------------------------------------------------------------------- Problem List Details Patient Name: Date of Service: [name], [name] 12/30/2018 9:00 AM Medical Record [id number] Patient Account Number: [id number] Date of Birth/Sex: Treating RN: Apr 14, 1950 (68 y.o. [name], [name] Primary Care Provider: [name] Other Clinician: Referring Provider: Treating Provider/Extender:Robert Sunga, [name], [name] in Treatment: 0 Active Problems ICD-10 Evaluated Encounter Code Description Active Date Today Diagnosis 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, stage 4 12/30/2018 No Yes G82.20 Paraplegia, unspecified 12/30/2018 No Yes E11.622 Type 2 diabetes mellitus with other skin ulcer 12/30/2018 No Yes Inactive Problems Resolved Problems Electronic Signature(s) Signed: 12/31/2018 8:02:37 AM By: Linton Ham MD Entered By: Linton Ham on 12/30/2018 09:55:01 -------------------------------------------------------------------------------- Progress Note Details Patient Name: Date of Service: [name], [name] 12/30/2018 9:00 AM Medical Record [id number] Patient Account Number: [id number] Date of Birth/Sex: Treating RN: [birth year] (68 y.o. [name], [name] Primary Care Provider: [name] Other Clinician: Referring Provider: Treating Provider/Extender:Kenyetta Fife, [name], [name] in Treatment: 0 Subjective Chief Complaint Information obtained from Patient She is here for follow up evaluation of left ischial pressure ulcer 12/30/2018; patient comes back for review of wounds in the same general area over the previously healed left ischial pressure ulcer History of Present Illness (HPI) The following HPI elements were documented for the patient's wound: Location: open ulceration of the left gluteal area, left heel and right ankle for about 5 months. Quality: Patient reports No Pain. Severity: Patient states wound(s) are getting worse. Duration:  Patient has had the wound for > 5 months prior to seeking treatment at the wound center Context: The wound occurred when the patient has been paraplegic for about 3 years. Modifying Factors: Wound improving due to current treatment. Associated Signs and Symptoms: Patient reports having foul odor. this 68 year old patient who is known to have hypertension, hypothyroidism, breast cancer, chronic pain syndrome, paraplegia was noted to have a left gluteal decubitus ulcer and was brought into the hospital. During the course of her hospitalization she was debrided in the operating room by Dr. Leland Johns and had all the wounds sharply debrided. The debridement was done for the left ischial wound, the left heel wound and the right ankle wound. Bone cultures were taken at that time but were negative but clinically she was treated for osteomyelitis because of the probing down to bone and open exposed bone. Home health has been giving her antibioticss which include vancomycin and Zosyn. The patient was a smoker until about 3 weeks ago and used to smoke about 10 cigarettes a day for a long while. 12/13/2014 - details of her operative note from 11/03/2014 were reviewed -- PROCEDURE: 1. Excisional debridement skin, subcutaneous, muscle left ischium 35 cm2 2. Excisional debridement skin, subcutaneous tissue left heel 27 cm2 3. Excisional debridement right ankle skin, subcutaneous, bone 30 cm2 01/24/2015 -- she has some issues with her wheelchair cushion but other than that is doing very well and has received Podus boots for her feet. 02/14/2015 -- she was using  her old offloading boots and this seemed to have caused her a new pressure ulcer on the left posterior heel near the superior part just below the Achilles tendon. 03/07/2015 -- she has a new ulceration just to the left of the midline on her sacral region more on the left buttock and this has been there for about a week. 08/22/2015 -- was recently admitted  to hospital between May 5 and 08/13/2015, with sepsis and leukocytosis due to a UTI. she was treated for a sepsis complicating Escherichia coli UTI and kidney stones. She also had metabolic and careful up at the secondary to pyelonephritis. He received broad-spectrum antibiotics initially and then received Macrobid as per urology. She was sent home on nitrofurantoin. during her admission she had a CT scan which showed exposed left ischial tuberosity without evidence of osteolysis. 09/12/2015-- the patient is having some issues with her air mattress and would like to get a opinion from medical modalities. 10/10/2015 -- the issue with her air mattress has not yet been sorted out and the new problem seems to be a lot of odor from the wound VAC. 11/27/2015 -- the patient was admitted to the hospital between July 23 and 10/31/2015. Her problems were sepsis, osteomyelitis of the pelvic bone and acute pyelonephritis. CT of the abdomen and pelvis was consistent with a left- sided pyelonephritis with hydronephrosis and also just showed new sclerosis of the posterior portion of the left anterior pubic ramus suggestive of periosteal reaction consistent with osteomyelitis. She was treated for the osteomyelitis with infectious disease consult recommending 6 weeks of IV antibiotics including vancomycin and Rocephin and the antibiotics were to go on until 12/10/2015. He was seen by Dr. Iran Planas plastic surgery and Dr. Linus Salmons of infectious disease. She had a suprapubic catheter placed during the admission. CT scan done on 10/28/2015 showed specifically -- New sclerosis of the posterior portion of the left inferior pubic ramus with aggressive periosteal reaction, consistent with osteomyelitis, with adjacent soft tissue gas compatible with previously described decubitus ulcer. 12/12/2015 -- she was recently seen by Dr. Linus Salmons, who noted good improvement and CRP and ESR compared to before and he has stopped her  antibiotic as per plans to finish on September 4. The patient was encouraged to continue with wound care and consider hyperbaric oxygen therapy. Today she tells me that she has consented to undergo hyperbaric oxygen therapy and we can start the paperwork. 01/02/2016 -- her PCP had gained about 3 years but she still persists in having problems during hyperbaric oxygen therapy with some discomfort in the ears. 01/09/16; pressure area with underlying osteomyelitis in the left buttock. Wound bed itself has some slight amount of grayish surface slough however I do not think any debridement was necessary. There is no exposed bone soft tissue appears stable. She is using a wound VAC 01/16/16; back for weekly wound review in conjunction with HBO. She has a deep wound over the left initial tuberosity previously treated with 6 weeks of IV antibiotics for osteomyelitis. Wound bed looks reasonably healthy although the base of this is still precariously close to bone. She has been using a wound VAC. 01/23/2016 -- she has completed her course of antibiotics and this week the only new thing is her right great toe nail was avulsed and she has got an open wound over the nailbed. 01/31/16 she has completed her course of antibiotics. Her right great toenail avulsed last week and she's been using silver alginate for this as well. Still using  a wound VAC to the substantial stage IV wound over the left ischial tuberosity 03/05/2016 -- the patient has had a opinion from the plastic surgery group at [facility] and details of this are not available yet but the patient's verbal report has been heard by me. Did not sound like there was any optimistic discussion regarding reconstruction and the net result would be to continue with the wound VAC application. I will await the official reports. Addendum: -- she was seen at [place] surgery service by Dr. Tressa Busman. After a thorough review and from  what I understand spending 45 minutes with the patient his assessment has been noted by me in detail and the management options were: 1. Continued pressure offloading and wound care versus operative procedures including wound excision 2. Soft tissue and bone sampling 3. If the wound gets larger wound closure would be done using a variety of plastic surgical techniques including but not limited to skin substitute, possible skin graft, local versus regional flaps, negative pressure dressing application. 4. He discussed with her details of flap surgery and the risks associated 5. He made a comment that since the patient was operated on by Dr. Leland Johns of [facility] plastic surgery unit in [place] the patient may continue to follow-up there for further evaluation for surgical flap closure in the future. 03/19/2016 -- the patient continues to be rather depressed and frustrated with her lack of rapid progress in healing this wound especially because she thought after hyperbaric oxygen therapy the wound would heal extremely fast. She now understands that was not the implied benefit on wound care which was the recommendation for hyperbaric oxygen therapy. I have had a lengthy discussion with the patient and her husband regarding her options: 1. Continue with collagen and wound VAC for the primary dressing and offloading and all supportive care. 2. See Dr. Iran Planas for possible placement of Acell or Integra in the OR. 3. get a second opinion from a wound care center and surrounding regions/counties 05/07/2016 -- Note from Dr. Celedonio Miyamoto, who noted that the patient has declined flap surgery. She has discussed application of A cell, and try a few applications to see how the wound progresses. She is also recommended that we could apply products here in the wound center, like Oasis. during her preop workup it was found that her hemoglobin A1c was 11% and she has now been diagnosed as  having diabetes mellitus and has been put on appropriate treatment by her PCP 05/28/2016 -- tells me her blood sugars have been doing well and she has an appointment to see her PCP in the next couple of weeks to check her hemoglobin A1c. Other than that she continues to do well. 06/25/2016 -- have not seen her back for the last month but she says her health has been about the same and she has an appointment to check the A1c next week 09/10/16 ---- was seen by Dr. Celedonio Miyamoto -- who applied Acell and saw her back in follow-up. She has recommended silver alginate to the wound every other day and cover with foam. If no significant drainage could transition to collagen every other day. She recommended discontinuing wound VAC. There were no plans to repeat application of Acell. The patient expressed that her husband could do the wound care as going to the Wound Ctr., would cost several $100 for each visit. 10/21/2016 -- her insurance company is getting her new mattress and she is pleased about  that. Other than that she has been doing dressings with PolyMem Silver and has been doing very well 02/18/2017 -- she has gone through several changes of her mattress and has not been pleased with any of them. The ventricles are still working on trying to fit her with the appropriate low air-loss mattress. She has a new wound on the gluteal area which is clearly separated from the original wound. 03/25/17-she is here in follow-up evaluation for her left ischialpressure ulcer. She remains unsatisfied with her pressure mattress. She admits to sitting multiple hours a day, in the bed. We have discussed offloading options. The wound does not appear infected. Nutrition does not appear to be a concern. Will follow-up in 4 weeks, if wound continues to be stalled may consider x-ray to evaluate for refractory osteomyelitis. 04/21/17; this is a patient that I don't know all that well. She has a chronic wound which at one  point had underlying osteomyelitis in the left ischial tuberosity. This is a stage IV pressure ulcer. Over the last 3 months she has a stage II wound inferiorly to the original wound. The last time she was here her dressing was changed to silver collagen although the patient's husband who changes the dressing said that the collagen stuck to the wound and remove skin from the superficial area therefore he switched back to [place] 05/13/17; this is a patient we've been following for a left ischial tuberosity wound which was stage IV at one point had underlying osteomyelitis. Over the last several months she's had a stage II wound just inferior and medial to the related to the wound. According to her husband he is using Endoform layer with collagen although this is not what I had last time. According to her husband they are using Elgie Congo with collagen although I don't quite know how that started. She was hospitalized from 1/20 through 04/30/16. This was related to a UTI. Her blood cultures were negative, urine culture showed multiple species. She did have a CT scan of the abdomen and pelvis which documented chronic osteomyelitis in the area of the wound inflammatory markers were unremarkable. She has had prior knowledge of osteomyelitis. It looks as though she received IV antibiotics in 2017 and was treated with a course of hyperbaric oxygen. 05/28/17; the wound over the left ischial tuberosity is deeper today and abuts clearly on bone. Nursing intake reported drainage. I therefore culture of the wound. The more superficial area just below this looks about the same. They once again complained that there are mattress cover is not working although apparently [facility] is been noted to see this many times in the report is that the device is functional 06/18/17; the patient had a probing area on the left ischial tuberosity that was draining purulent fluid last time. This also clearly seemed  to have open bone. Culture I did showed pansensitive pseudomonas including third generation cephalosporins. I treated this with cefdinir 300 twice a day for 10 days and things seem to have improved. She has a more superficial wound just underneath this area. Amazingly she has a new air mattress through [facility]. I think they gave this to her as a parking give. In any case this now works according to the patient may have something to do with why the areas are looking better. 07/09/17; the patient has a probing area in the left ischial tuberosity that still has some depth. However this is contracted in terms of the wound orifice although  the depth is still roughly the same. There is no undermining. ooShe also has the satellite wound which is more superficial. This appears to have a healthy surface we've been using silver collagen 08/06/17; the patient's wound is over the left ischial tuberosity and a satellite lesion just underneath this. The original wound was actually a deep stage 4 wound. We have made good progress in 2 months and there is no longer exposed bone here. 09/03/17; left ischial tuberosity actually appears to be quite healthy. I think we are making progress. No debridement is required. There is no surrounding erythema 10/01/17 I follow this patient monthly for her left ischial tuberosity wound. There is 2 areas the original area and a satellite area. The satellite area looks a lot better there is no surrounding erythema. Her husband relates that he is having trouble maintaining the dressing. This has to do with the soft tissue around it. He states he puts the collagen in but he cannot make sure that it stays in even with the ABD pads and tape that he is been using 10/29/17; patient arrives with a better looking noon today. Some of the satellite lesions have closed. using Prisma 11/26/17; the patient has a large cone-shaped area with the tip of the Cone deep within her buttock  soft tissue. The walls of the Cone are epithelialized however the base is still open. The area at the base of this looks moist we've been using silver collagen. Will change to silver alginate 12/31/2017; the wound appears to have come in fairly nicely. Using silver alginate. There is no surrounding maceration or infection 01/28/18; there is still an open area here over the left initial tuberosity. Base of this however looks healthy. There is no surrounding infection 02/25/18; the area of its open is over the left ischial tuberosity. The base of this is where the wound is. This is a large inverted cone-shaped area with the wound at the tip. Dimensions of the wound at the tip are improved. There is a area of denuded skin about halfway towards the tip which her husband thinks may have happened today when he was bathing her. 04/20/17; the area is still open over the left initial tuberosity. 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 This is now completely closed over although it is closed over with a divot and skin over bone at the base of this. Continued aggressive offloading will be necessary. 12/30/2018 READMISSION This is a 68 year old woman with chronic paraplegia. I picked her up for her care from Dr. Con Memos in this clinic after he departed. She had a stage IV pressure wound over the left ischial tuberosity. She was treated twice for her underlying osteomyelitis and this I believe firstly in 2016 and again in 2017. There were some plans at some point for flap closure of this however she was discovered to have uncontrolled diabetes and I do not think this was ever accomplished. She ultimately healed over in this clinic and was discharged in May. She has a large cone-shaped indentation with the tip of this  going towards the left ischial tuberosity. It is not an easy area to examine but at that time I thought all of this was epithelialized. Apparently there was a reopening here shortly after she left the clinic last time. She  was admitted to hospital at the end of June for Klebsiella bacteremia felt to be secondary to UTI. A CT scan of the pelvis is listed below and there was initially some concern that she had underlying osteomyelitis although I believe she was seen by infectious disease and that was felt to be not the case: I do not see any new cultures or inflammatory markers IMPRESSION: 1. No CT evidence for acute intra-abdominal or pelvic abnormality. Large volume of stool throughout the colon. 2. Enlarged fatty liver with fat sparing near the gallbladder fossa 3. Cortical scarring right kidney. Bilateral intrarenal stones without hydronephrosis. Thick-walled urinary bladder decompressed by suprapubic catheter 4. Deep left decubitus ulcer with underlying left ischial changes suggesting osteomyelitis. Her husband has been using silver collagen in the wound. She has not been systemically unwell no fever chills eating and drinking well. They rigorously offload this wound only getting up in the wheelchair when she is going to appointments the rest of the time she is in bed. Patient History Information obtained from Patient. Allergies Iodinated Contrast Media - IV Dye (Severity: Severe, Reaction: hot and sweaty), aspirin (Severity: Severe, Reaction: hives), Sulfa (Sulfonamide Antibiotics) (Severity: Severe, Reaction: hives) Family History Unknown History, Cancer - Mother, Diabetes - Maternal Grandparents, Heart Disease - Mother, Hypertension - Mother, Thyroid Problems - Mother, No family history of Hereditary Spherocytosis, Kidney Disease, Lung Disease, Seizures, Stroke, Tuberculosis. Social History Former smoker - ended on 11/06/2014, Marital Status - Married, Alcohol Use - Never, Drug Use -  No History, Caffeine Use - Daily - Tea, soda. Medical History Eyes Denies history of Cataracts, Glaucoma, Optic Neuritis Ear/Nose/Mouth/Throat Denies history of Chronic sinus problems/congestion, Middle ear problems Hematologic/Lymphatic Patient has history of Anemia Denies history of Hemophilia, Human Immunodeficiency Virus, Lymphedema, Sickle Cell Disease Respiratory Denies history of Aspiration, Asthma, Chronic Obstructive Pulmonary Disease (COPD), Pneumothorax, Sleep Apnea, Tuberculosis Cardiovascular Patient has history of Hypertension Denies history of Angina, Arrhythmia, Congestive Heart Failure, Coronary Artery Disease, Deep Vein Thrombosis, Hypotension, Myocardial Infarction, Peripheral Arterial Disease, Peripheral Venous Disease, Phlebitis, Vasculitis Gastrointestinal Denies history of Cirrhosis , Colitis, Crohnoos, Hepatitis A, Hepatitis B, Hepatitis C Endocrine Patient has history of Type II Diabetes Denies history of Type I Diabetes Genitourinary Denies history of End Stage Renal Disease Immunological Denies history of Lupus Erythematosus, Raynaudoos, Scleroderma Integumentary (Skin) Denies history of History of Burn Musculoskeletal Patient has history of Osteoarthritis Denies history of Gout, Rheumatoid Arthritis, Osteomyelitis Neurologic Patient has history of Dementia, Paraplegia Denies history of Neuropathy, Quadriplegia Oncologic Patient has history of Received Radiation - 2012 - right breast Psychiatric Denies history of Anorexia/bulimia, Confinement Anxiety Patient is treated with Oral Agents. Blood sugar is not tested. Hospitalization/Surgery History - left knee replacement. - right breast lumpectomy. - cervical laminectomy with fusion. - hysterectomy. - tear duct surgery. - T and A. - UTI. - suprapubic cath placement. - UTI. Medical And Surgical History Notes Gastrointestinal constipation GERD Genitourinary UTI kidney stones neurogenic  bladder Integumentary (Skin) left gluteal fold sacral Musculoskeletal cervical neuropathy osteomyelitis Psychiatric admitted for suicide risk on 12/02/2018 Review of Systems (ROS) Constitutional Symptoms (General Health) Denies complaints or symptoms of Fatigue, Fever, Chills, Marked Weight Change. Eyes Denies complaints or symptoms of Dry Eyes, Vision Changes, Glasses / Contacts. Ear/Nose/Mouth/Throat Denies complaints or symptoms of Chronic sinus problems or rhinitis. Respiratory Denies complaints or symptoms of Chronic or frequent coughs, Shortness of Breath. Cardiovascular Denies complaints or symptoms of Chest pain. Endocrine Denies complaints or symptoms of Heat/cold intolerance. Integumentary (Skin) Complains or has  symptoms of Wounds - left ischial. Neurologic Denies complaints or symptoms of Numbness/parasthesias. Psychiatric Denies complaints or symptoms of Claustrophobia, Suicidal. Objective Constitutional Patient is hypertensive.. Pulse regular and within target range for patient.Marland Kitchen Respirations regular, non-labored and within target range.. Temperature is normal and within the target range for the patient.Marland Kitchen Appears in no distress. Vitals Time Taken: 9:06 AM, Height: 63 in, Source: Stated, Weight: 185 lbs, Source: Stated, BMI: 32.8, Temperature: 97.9 F, Pulse: 82 bpm, Respiratory Rate: 18 breaths/min, Blood Pressure: 158/77 mmHg. General Notes: Has not done CBG this morning but husband stated A1C was 6.0 on 12/21/18. Eyes Conjunctivae clear. No discharge.no icterus. Respiratory work of breathing is normal. Gastrointestinal (GI) Abdomen is soft and non-distended without masses or tenderness.. Neurological No change in neurologic status. Psychiatric appears at normal baseline. General Notes: Wound exam ooThe area in question is on the left initial tuberosity. Most of this is a deep cone- shaped area that is reminiscent of the healed area from May of this year. On  the left side of the cone there is an open area that required debridement to remove fibrinous adherent debris hemostasis with direct pressure. Using my hands in the hands of my case manager we were able to look at the tip of this and I think it is still open as well. There is no clear evidence of infection even at the tip of this area no palpable bone. Integumentary (Hair, Skin) There is no erythema around the wound. Wound #12 status is Open. Original cause of wound was Pressure Injury. The wound is located on the Left Ischial Tuberosity. The wound measures 0.3cm length x 1cm width x 0.9cm depth; 0.236cm^2 area and 0.212cm^3 volume. There is muscle and Fat Layer (Subcutaneous Tissue) Exposed exposed. There is no tunneling or undermining noted. There is a small amount of serosanguineous drainage noted. The wound margin is well defined and not attached to the wound base. There is medium (34-66%) pink granulation within the wound bed. There is a small (1- 33%) amount of necrotic tissue within the wound bed including Adherent Slough. Assessment Active Problems ICD-10 Pressure ulcer of left buttock, stage 4 Paraplegia, unspecified Type 2 diabetes mellitus with other skin ulcer Procedures Wound #12 Pre-procedure diagnosis of Wound #12 is a Pressure Ulcer located on the Left Ischial Tuberosity . There was a Excisional Skin/Subcutaneous Tissue Debridement with a total area of 0.3 sq cm performed by Ricard Dillon., MD. With the following instrument(s): Curette to remove Non-Viable tissue/material. Material removed includes Subcutaneous Tissue and Fibrin/Exudate and after achieving pain control using Lidocaine 4% Topical Solution. A time out was conducted at 09:42, prior to the start of the procedure. A Moderate amount of bleeding was controlled with Pressure. The procedure was tolerated well with a pain level of 0 throughout and a pain level of 0 following the procedure. Post Debridement  Measurements: 0.3cm length x 1cm width x 0.9cm depth; 0.212cm^3 volume. Post debridement Stage noted as Category/Stage IV. Character of Wound/Ulcer Post Debridement is improved. Post procedure Diagnosis Wound #12: Same as Pre-Procedure Plan Follow-up Appointments: Return Appointment in 2 weeks. Dressing Change Frequency: Change Dressing every other day. Skin Barriers/Peri-Wound Care: Skin Prep - to periwound. Barrier cream - as needed for maceration, wetness, or redness to periwound. Wound Cleansing: Wound #12 Left Ischial Tuberosity: May shower and wash wound with soap and water. - or wound cleanser Primary Wound Dressing: Wound #12 Left Ischial Tuberosity: Calcium Alginate with Silver - back with gauze. Secondary Dressing: Wound #12 Left  Ischial Tuberosity: ABD pad - or bordered foam. Off-Loading: Low air-loss mattress (Group 2) - continue to use Gel wheelchair cushion - continue to use specialty wheelchair cushion. Turn and reposition every 2 hours 1. Difficult area over the left ischial tuberosity. 2. The wounds themselves are in close juxtaposition. 1 at the tip of the original area and the other in close juxtaposition to this area on the side of the original cone-shaped healed area. The latter required debridement. 3. I am going to go with silver alginate as the primary dressing to see if we can dry this area out. 4. The question of recurrent osteomyelitis was reviewed based on the hospitalization she had at the end of June. Right now I am not of the feeling that there was overt evidence of osteomyelitis. There is no palpable bone. She did not have cultures or inflammatory markers I think she was seen by infectious disease who did not think that she had recurrent osteomyelitis although I think we are going to have to be vigilant going forward. Electronic Signature(s) Signed: 12/31/2018 8:02:37 AM By: Linton Ham MD Entered By: Linton Ham on 12/30/2018  10:07:59 -------------------------------------------------------------------------------- HxROS Details Patient Name: Date of Service: [name], [name] 12/30/2018 9:00 AM Medical Record [id number] Patient Account Number: [id number] Date of Birth/Sex: Treating RN: [birth year] (68 y.o. [name] Primary Care Provider: Other Clinician: [name] Referring Provider: Treating Provider/Extender:Luvada Salamone, [name], [name] in Treatment: 0 Information Obtained From Patient Constitutional Symptoms (General Health) Complaints and Symptoms: Negative for: Fatigue; Fever; Chills; Marked Weight Change Eyes Complaints and Symptoms: Negative for: Dry Eyes; Vision Changes; Glasses / Contacts Medical History: Negative for: Cataracts; Glaucoma; Optic Neuritis Ear/Nose/Mouth/Throat Complaints and Symptoms: Negative for: Chronic sinus problems or rhinitis Medical History: Negative for: Chronic sinus problems/congestion; Middle ear problems Respiratory Complaints and Symptoms: Negative for: Chronic or frequent coughs; Shortness of Breath Medical History: Negative for: Aspiration; Asthma; Chronic Obstructive Pulmonary Disease (COPD); Pneumothorax; Sleep Apnea; Tuberculosis Cardiovascular Complaints and Symptoms: Negative for: Chest pain Medical History: Positive for: Hypertension Negative for: Angina; Arrhythmia; Congestive Heart Failure; Coronary Artery Disease; Deep Vein Thrombosis; Hypotension; Myocardial Infarction; Peripheral Arterial Disease; Peripheral Venous Disease; Phlebitis; Vasculitis Endocrine Complaints and Symptoms: Negative for: Heat/cold intolerance Medical History: Positive for: Type II Diabetes Negative for: Type I Diabetes Time with diabetes: 1 year Treated with: Oral agents Blood sugar tested every day: No Integumentary (Skin) Complaints and Symptoms: Positive for: Wounds - left ischial Medical History: Negative for: History of Burn Past Medical  History Notes: left gluteal fold sacral Neurologic Complaints and Symptoms: Negative for: Numbness/parasthesias Medical History: Positive for: Dementia; Paraplegia Negative for: Neuropathy; Quadriplegia Psychiatric Complaints and Symptoms: Negative for: Claustrophobia; Suicidal Medical History: Negative for: Anorexia/bulimia; Confinement Anxiety Past Medical History Notes: admitted for suicide risk on 12/02/2018 Hematologic/Lymphatic Medical History: Positive for: Anemia Negative for: Hemophilia; Human Immunodeficiency Virus; Lymphedema; Sickle Cell Disease Gastrointestinal Medical History: Negative for: Cirrhosis ; Colitis; Crohns; Hepatitis A; Hepatitis B; Hepatitis C Past Medical History Notes: constipation GERD Genitourinary Medical History: Negative for: End Stage Renal Disease Past Medical History Notes: UTI kidney stones neurogenic bladder Immunological Medical History: Negative for: Lupus Erythematosus; Raynauds; Scleroderma Musculoskeletal Medical History: Positive for: Osteoarthritis Negative for: Gout; Rheumatoid Arthritis; Osteomyelitis Past Medical History Notes: cervical neuropathy osteomyelitis Oncologic Medical History: Positive for: Received Radiation - 2012 - right breast Immunizations Pneumococcal Vaccine: Received Pneumococcal Vaccination: Yes Implantable Devices None Hospitalization / Surgery History Type of Hospitalization/Surgery left knee replacement right breast lumpectomy cervical laminectomy with fusion hysterectomy tear  duct surgery Tand A UTI suprapubic cath placement UTI Family and Social History Unknown History: Yes; Cancer: Yes - Mother; Diabetes: Yes - Maternal Grandparents; Heart Disease: Yes - Mother; Hereditary Spherocytosis: No; Hypertension: Yes - Mother; Kidney Disease: No; Lung Disease: No; Seizures: No; Stroke: No; Thyroid Problems: Yes - Mother; Tuberculosis: No; Former smoker - ended on 11/06/2014; Marital  Status - Married; Alcohol Use: Never; Drug Use: No History; Caffeine Use: Daily - Tea, soda; Financial Concerns: No; Food, Clothing or Shelter Needs: No; Support System Lacking: No; Transportation Concerns: No Electronic Signature(s) Signed: 12/31/2018 8:02:37 AM By: Linton Ham MD Signed: 01/13/2019 4:32:31 PM By: Kela Millin Entered By: Kela Millin on 12/30/2018 09:48:19 -------------------------------------------------------------------------------- SuperBill Details Patient Name: Date of Service: Frank, Pilger 12/30/2018 Medical Record [id number] Patient Account Number: [id number] Date of Birth/Sex: Treating RN: 08/22/1950 (68 y.o. [name], [name] Primary Care Provider: [name] Other Clinician: Referring Provider: Treating Provider/Extender:Jerald Hennington, [name], [name] in Treatment: 0 Diagnosis Coding ICD-10 Codes Code Description [phone] Pressure ulcer of left buttock, stage 4 G82.20 Paraplegia, unspecified E11.622 Type 2 diabetes mellitus with other skin ulcer Facility Procedures The patient participates with Medicare or their insurance follows the Medicare Facility Guidelines: CPT4 Code Description Modifier Quantity [id number] [facility] VISIT-LEV 3 EST PT 1 The patient participates with Medicare or their insurance follows the Medicare Facility Guidelines: [id number] [place] TISSUE 20 SQ CM/< 1 ICD-10 Diagnosis Description 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, stage 4 E11.622 Type 2 diabetes  mellitus with other skin ulcer Physician Procedures CPT4 Code: 1660630 Description: 16010 - WC PHYS LEVEL 4 - EST PT ICD-10 Diagnosis Description 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, stage 4 G82.20 Paraplegia, unspecified E11.622 Type 2 diabetes mellitus with other skin ulcer Modifier: 25 Quantity: 1 CPT4 Code: 9323557 Description: 32202 - WC PHYS SUBQ TISS 20 SQ CM ICD-10 Diagnosis Description 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, stage 4  E11.622 Type 2 diabetes mellitus with other skin ulcer Modifier: Quantity: 1 Electronic Signature(s) Signed: 12/31/2018 8:02:37 AM By: Linton Ham MD Entered By: Linton Ham on 12/30/2018 54:27:06

## 2019-01-13 NOTE — Progress Notes (Signed)
FARIZA, WINDECKER ([id number]) Visit Report for 12/30/2018 Allergy List Details Patient Name: Date of Service: [name], [name] 12/30/2018 9:00 AM Medical Record Number:[id number] Patient Account Number: [id number] Date of Birth/Sex: Treating RN: [name] (68 y.o. [name] Primary Care [name]: [name] Other Clinician: Referring [name]: Treating [name]/Extender:[name], [name], [name] in Treatment: 0 Allergies Active Allergies Iodinated Contrast Media - IV Dye Reaction: hot and sweaty Severity: Severe Active: 11/22/2014 aspirin Reaction: hives Severity: Severe Active: 11/22/2014 Sulfa (Sulfonamide Antibiotics) Reaction: hives Severity: Severe Active: 11/22/2014 Allergy Notes Electronic Signature(s) Signed: 01/13/2019 4:32:31 PM By: [name] Entered By: [name] on 12/30/2018 09:22:35 -------------------------------------------------------------------------------- Arrival Information Details Patient Name: Date of Service: [name], [name] 12/30/2018 9:00 AM Medical Record [id number] Patient Account Number: [id number] Date of Birth/Sex: Treating RN: [name] (68 y.o. [name], [name] Primary Care [name]: [name] Other Clinician: Referring [name]: Treating [name]/Extender:[name], [name], [name] in Treatment: 0 Visit Information Patient Arrived: Wheel Chair Arrival Time: 09:02 Accompanied By: husband Transfer Assistance: Other Patient Identification Verified: Yes Secondary Verification Process Completed: Yes Patient Requires Transmission-Based No Precautions: Patient Has Alerts: No History Since Last Visit All ordered tests and consults were completed: No Added or deleted any medications: Yes Any new allergies or adverse reactions: No Had a fall or experienced change in activities of daily living that may affect risk of falls: No Signs or symptoms of abuse/neglect since last visito No Hospitalized  since last visit: Yes Implantable device outside of the clinic excluding cellular tissue based products placed in the center since last visit: No Electronic Signature(s) Signed: 01/13/2019 4:32:31 PM By: [name] Entered By: [name] on 12/30/2018 09:06:25 -------------------------------------------------------------------------------- Clinic Level of Care Assessment Details Patient Name: Date of Service: [name], [name] 12/30/2018 9:00 AM Medical Record Number:[id number] Patient Account Number: [id number] Date of Birth/Sex: Treating RN: [name] (68 y.o. [name], [name] Primary Care [name]: [name] Other Clinician: Referring [name]: Treating [name]/Extender:[name], [name], [name] in Treatment: 0 Clinic Level of Care Assessment Items TOOL 1 Quantity Score X - Use when EandM and Procedure is performed on INITIAL visit 1 0 ASSESSMENTS - Nursing Assessment / Reassessment X - General Physical Exam (combine w/ comprehensive assessment (listed just below) 1 20 when performed on new pt. evals) X - Comprehensive Assessment (HX, ROS, Risk Assessments, Wounds Hx, etc.) 1 25 ASSESSMENTS - Wound and Skin Assessment / Reassessment X - Dermatologic / Skin Assessment (not related to wound area) 1 10 ASSESSMENTS - Ostomy and/or Continence Assessment and Care []  - Incontinence Assessment and Management 0 []  - Ostomy Care Assessment and Management (repouching, etc.) 0 PROCESS - Coordination of Care X - Simple Patient / Family Education for ongoing care 1 15 []  - Complex (extensive) Patient / Family Education for ongoing care 0 X - Staff obtains Programmer, systems, Records, Test Results / Process Orders 1 10 []  - Staff telephones HHA, Nursing Homes / Clarify orders / etc 0 []  - Routine Transfer to another Facility (non-emergent condition) 0 []  - Routine Hospital Admission (non-emergent condition) 0 X - New Admissions / Biomedical engineer / Ordering NPWT, Apligraf, etc.  1 15 []  - Emergency Hospital Admission (emergent condition) 0 PROCESS - Special Needs []  - Pediatric / Minor Patient Management 0 []  - Isolation Patient Management 0 []  - Hearing / Language / Visual special needs 0 []  - Assessment of Community assistance (transportation, D/C planning, etc.) 0 []  - Additional assistance / Altered mentation 0 []  - Support Surface(s) Assessment (bed, cushion, seat, etc.) 0 INTERVENTIONS -  Miscellaneous []  - External ear exam 0 []  - Patient Transfer (multiple staff / Harrel Lemon Lift / Similar devices) 0 []  - Simple Staple / Suture removal (25 or less) 0 []  - Complex Staple / Suture removal (26 or more) 0 []  - Hypo/Hyperglycemic Management (do not check if billed separately) 0 []  - Ankle / Brachial Index (ABI) - do not check if billed separately 0 Has the patient been seen at the hospital within the last three years: Yes Total Score: 95 Level Of Care: New/Established - Level 3 Electronic Signature(s) Signed: 12/30/2018 7:07:15 PM By: [name] Entered By: [name] on 12/30/2018 09:55:36 -------------------------------------------------------------------------------- Lower Extremity Assessment Details Patient Name: Date of Service: [name], [name] 12/30/2018 9:00 AM Medical Record [id number] Patient Account Number: [id number] Date of Birth/Sex: Treating RN: [birth year] (68 y.o. [name] Primary Care [name]: [name] Other Clinician: Referring [name]: Treating [name]/Extender:[name], [name], [name] in Treatment: 0 Electronic Signature(s) Signed: 01/13/2019 4:32:31 PM By: [name] Entered By: [name] on 12/30/2018 09:34:44 -------------------------------------------------------------------------------- Multi Wound Chart Details Patient Name: Date of Service: [name], [name] 12/30/2018 9:00 AM Medical Record [id number] Patient Account Number: [id number] Date of Birth/Sex: Treating  RN: [name] (68 y.o. [name], [name] Primary Care [name]: [name] Other Clinician: Referring Willeen Novak: Treating Wister Hoefle/Extender:[name], [name], [name] in Treatment: 0 Vital Signs Height(in): 33 Pulse(bpm): 54 Weight(lbs): 185 Blood Pressure(mmHg): 158/77 Body Mass Index(BMI): 33 Temperature(F): 97.9 Respiratory 18 Rate(breaths/min): Photos: [12:No Photos] [N/A:N/A] Wound Location: [12:Left Ischial Tuberosity] [N/A:N/A] Wounding Event: [12:Pressure Injury] [N/A:N/A] Primary Etiology: [12:Pressure Ulcer] [N/A:N/A] Comorbid History: [12:Anemia, Hypertension, TypeN/A II Diabetes, Osteoarthritis, Dementia, Paraplegia, Received Radiation] Date Acquired: [12:09/06/2018] [N/A:N/A] Weeks of Treatment: [12:0] [N/A:N/A] Wound Status: [12:Open] [N/A:N/A] Measurements L x W x D 0.3x1x0.9 [N/A:N/A] (cm) Area (cm) : [12:0.236] [N/A:N/A] Volume (cm) : [12:0.212] [N/A:N/A] % Reduction in Area: [12:0.00%] [N/A:N/A] % Reduction in Volume: 0.00% [N/A:N/A] Classification: [12:Category/Stage IV] [N/A:N/A] Exudate Amount: [12:Small] [N/A:N/A] Exudate Type: [12:Serosanguineous] [N/A:N/A] Exudate Color: [12:red, brown] [N/A:N/A] Wound Margin: [12:Well defined, not attached N/A] Granulation Amount: [12:Medium (34-66%)] [N/A:N/A] Granulation Quality: [12:Pink] [N/A:N/A] Necrotic Amount: [12:Small (1-33%)] [N/A:N/A] Exposed Structures: [12:Fat Layer (Subcutaneous Tissue) Exposed: Yes Muscle: Yes Fascia: No Tendon: No Joint: No Bone: No] [N/A:N/A] Epithelialization: [12:None] [N/A:N/A] Debridement: [12:Debridement - Excisional] [N/A:N/A] Pre-procedure [12:09:42] [N/A:N/A] Verification/Time Out Taken: Pain Control: [12:Lidocaine 4% Topical Solution] [N/A:N/A] Tissue Debrided: [12:Subcutaneous] [N/A:N/A] Level: [12:Skin/Subcutaneous Tissue] [N/A:N/A] Debridement Area (sq cm):0.3 [N/A:N/A] Instrument: [12:Curette] [N/A:N/A] Bleeding: [12:Moderate] [N/A:N/A] Hemostasis Achieved:  [12:Pressure] [N/A:N/A] Procedural Pain: [12:0] [N/A:N/A] Post Procedural Pain: [12:0] [N/A:N/A] Debridement Treatment Procedure was tolerated [N/A:N/A] Response: [12:well] Post Debridement [12:0.3x1x0.9] [N/A:N/A] Measurements L x W x D (cm) Post Debridement [12:0.212] [N/A:N/A] Volume: (cm) Post Debridement Stage: Category/Stage IV [N/A:N/A N/A] Treatment Notes Electronic Signature(s) Signed: 12/30/2018 7:07:15 PM By: [name] Signed: 12/31/2018 8:02:37 AM By: Linton Ham MD Entered By: Linton Ham on 12/30/2018 09:55:09 -------------------------------------------------------------------------------- Multi-Disciplinary Care Plan Details Patient Name: Date of Service: [name], [name] 12/30/2018 9:00 AM Medical Record [id number] Patient Account Number: [id number] Date of Birth/Sex: Treating RN: [name] (68 y.o. [name], [name] Primary Care Tiane Szydlowski: [name] Other Clinician: Referring Adelaida Reindel: Treating Yandell Mcjunkins/Extender:[name], [name], [name] in Treatment: 0 Active Inactive Abuse / Safety / Falls / Self Care Management Nursing Diagnoses: Impaired physical mobility Potential for falls Goals: Patient will remain injury free related to falls Date Initiated: 12/30/2018 Target Resolution Date: 02/18/2019 Goal Status: Active Patient/caregiver will verbalize/demonstrate measure taken to improve self care Date Initiated: 12/30/2018 Target Resolution Date: 01/27/2019 Goal Status: Active  Interventions: Assess fall risk on admission and as needed Provide education on fall prevention Notes: Orientation to the Wound Care Program Nursing Diagnoses: Knowledge deficit related to the wound healing center program Goals: Patient/caregiver will verbalize understanding of the [place] Date Initiated: 12/30/2018 Target Resolution Date: 12/31/2018 Goal Status: Active Interventions: Provide education on orientation to the wound  center Notes: Wound/Skin Impairment Nursing Diagnoses: Knowledge deficit related to ulceration/compromised skin integrity Goals: Patient/caregiver will verbalize understanding of skin care regimen Date Initiated: 12/30/2018 Target Resolution Date: 01/14/2019 Goal Status: Active Interventions: Assess patient/caregiver ability to obtain necessary supplies Assess patient/caregiver ability to perform ulcer/skin care regimen upon admission and as needed Assess ulceration(s) every visit Provide education on ulcer and skin care Treatment Activities: Skin care regimen initiated : 12/30/2018 Topical wound management initiated : 12/30/2018 Notes: Electronic Signature(s) Signed: 12/30/2018 7:07:15 PM By: [name] Entered By: [name] on 12/30/2018 09:05:04 -------------------------------------------------------------------------------- Pain Assessment Details Patient Name: Date of Service: [name], [name] 12/30/2018 9:00 AM Medical Record [id number] Patient Account Number: [id number] Date of Birth/Sex: Treating RN: [name] (68 y.o. [name] Primary Care Brithney Bensen: [name] Other Clinician: Referring Zarion Oliff: Treating Myrth Dahan/Extender:[name], [name], [name] in Treatment: 0 Active Problems Location of Pain Severity and Description of Pain Patient Has Paino No Site Locations Pain Management and Medication Current Pain Management: Electronic Signature(s) Signed: 01/13/2019 4:32:31 PM By: [name] Entered By: [name] on 12/30/2018 09:34:59 -------------------------------------------------------------------------------- Patient/Caregiver Education Details Patient Name: Date of Service: Lizzete, Hossfeld 9/24/2020andnbsp9:00 AM Medical Record [id number] Patient Account Number: [id number] Date of Birth/Gender: [birth year] (68 y.o. F) Treating RN: [name] Primary Care Physician: [name] Other Clinician: Referring  Physician: Treating Physician/Extender:[name], [name], [name] in Treatment: 0 Education Assessment Education Provided To: Patient Education Topics Provided Safety: Handouts: Safe Transfers Methods: Explain/Verbal Responses: Reinforcements needed Welcome To [facility] Handouts: Welcome To [facility] Methods: Explain/Verbal Responses: Reinforcements needed Electronic Signature(s) Signed: 12/30/2018 7:07:15 PM By: [name] Entered By: [name] on 12/30/2018 09:05:40 -------------------------------------------------------------------------------- Wound Assessment Details Patient Name: Date of Service: [name], [name] 12/30/2018 9:00 AM Medical Record [id number] Patient Account Number: [id number] Date of Birth/Sex: Treating RN: [birth year] (68 y.o. [name] Primary Care Raksha Wolfgang: [name] Other Clinician: Referring Hamdi Vari: Treating Yzabelle Calles/Extender:[name], [name], [name] in Treatment: 0 Wound Status Wound Number: 12 Primary Pressure Ulcer Etiology: Wound Location: Left Ischial Tuberosity Wound Open Wounding Event: Pressure Injury Status: Date Acquired: 09/06/2018 Comorbid Anemia, Hypertension, Type II Diabetes, Weeks Of Treatment: 0 History: Osteoarthritis, Dementia, Paraplegia, Received Clustered Wound: No Radiation Photos Wound Measurements Length: (cm) 0.3 Width: (cm) 1 Depth: (cm) 0.9 Area: (cm) 0.236 Volume: (cm) 0.212 Wound Description Classification: Category/Stage IV Wound Margin: Well defined, not attached Exudate Amount: Small Exudate Type: Serosanguineous Exudate Color: red, brown Wound Bed Granulation Amount: Medium (34-66%) Granulation Quality: Pink Necrotic Amount: Small (1-33%) Necrotic Quality: Adherent Slough r After Cleansing: No ibrino Yes Exposed Structure posed: No (Subcutaneous Tissue) Exposed: Yes posed: No posed: Yes osis of Muscle: No osed: No sed: No %  Reduction in Area: 0% % Reduction in Volume: 0% Epithelialization: None Tunneling: No Undermining: No Foul Odo Slough/F Fascia Ex Fat Layer Tendon Ex Muscle Ex Necr Joint Exp Bone Expo Electronic Signature(s) Signed: 12/31/2018 8:42:43 AM By: Mikeal Hawthorne EMT/HBOT Signed: 01/13/2019 4:32:31 PM By: [name] Previous Signature: 12/30/2018 7:07:15 PM Version By: [name] Entered By: Mikeal Hawthorne on 12/31/2018 08:30:07 -------------------------------------------------------------------------------- Vitals Details Patient Name: Date of Service: [name], [name] 12/30/2018  9:00 AM Medical Record [id number] Patient Account Number: [id number] Date of Birth/Sex: Treating RN: [name] (68 y.o. [name] Primary Care Adams Hinch: [name] Other Clinician: Referring Adolf Ormiston: Treating Dashanti Burr/Extender:[name], [name], [name] in Treatment: 0 Vital Signs Time Taken: 09:06 Temperature (F): 97.9 Height (in): 63 Pulse (bpm): 82 Source: Stated Respiratory Rate (breaths/min): 18 Weight (lbs): 185 Blood Pressure (mmHg): 158/77 Source: Stated Reference Range: 80 - 120 mg / dl Body Mass Index (BMI): 32.8 Notes Has not done CBG this morning but husband stated A1C was 6.0 on 12/21/18. Electronic Signature(s) Signed: 01/13/2019 4:32:31 PM By: [name] Entered By: [name] on 12/30/2018 09:10:25

## 2019-01-13 NOTE — Progress Notes (Signed)
[name], [name] ([id number]) Visit Report for 01/13/2019 Debridement Details Patient Name: Date of Service: [name], [name] 01/13/2019 10:15 AM Medical Record Number:[id number] Patient Account Number: [id number] Date of Birth/Sex: Treating RN: [birth year] (68 y.o. [name], [name] Primary Care Provider: [name] Other Clinician: Referring Provider: Treating Provider/Extender:[name], [name], [name] in Treatment: 2 Debridement Performed for Wound #12 Left Ischial Tuberosity Assessment: Performed By: Physician Ricard Dillon., MD Debridement Type: Debridement Level of Consciousness (Pre- Awake and Alert procedure): Pre-procedure Yes - 11:29 Verification/Time Out Taken: Start Time: 11:30 Pain Control: Lidocaine 5% topical ointment Total Area Debrided (L x W): 1 (cm) x 1.6 (cm) = 1.6 (cm) Tissue and other material Viable, Non-Viable, Bone, Slough, Subcutaneous, Skin: Dermis , Fibrin/Exudate, debrided: Slough Level: Skin/Subcutaneous Tissue/Muscle/Bone Debridement Description: Excisional Instrument: Curette Specimen: Tissue Culture Number of Specimens Taken: 1 Bleeding: Moderate Hemostasis Achieved: Pressure End Time: 11:34 Procedural Pain: 0 Post Procedural Pain: 2 Response to Treatment: Procedure was tolerated well Level of Consciousness Awake and Alert (Post-procedure): Post Debridement Measurements of Total Wound Length: (cm) 1 Stage: Category/Stage IV Width: (cm) 1.6 Depth: (cm) 1 Volume: (cm) 1.257 Character of Wound/Ulcer Post Improved Debridement: Post Procedure Diagnosis Same as Pre-procedure Electronic Signature(s) Signed: 01/13/2019 5:49:31 PM By: Linton Ham MD Signed: 01/13/2019 6:06:48 PM By: Deon Pilling Entered By: Linton Ham on 01/13/2019 11:36:32 -------------------------------------------------------------------------------- HPI Details Patient Name: Date of Service: [name], [name] 01/13/2019 10:15 AM Medical Record  [id number] Patient Account Number: [id number] Date of Birth/Sex: Treating RN: Aug 02, 1950 (68 y.o. [name] Primary Care Provider: [name] Other Clinician: Referring Provider: Treating Provider/Extender:[name], [name], [name] in Treatment: 2 History of Present Illness Location: open ulceration of the left gluteal area, left heel and right ankle for about 5 months. Quality: Patient reports No Pain. Severity: Patient states wound(s) are getting worse. Duration: Patient has had the wound for > 5 months prior to seeking treatment at the wound center Context: The wound occurred when the patient has been paraplegic for about 3 years. Modifying Factors: Wound improving due to current treatment. Associated Signs and Symptoms: Patient reports having foul odor. HPI Description: this 68 year old patient who is known to have hypertension, hypothyroidism, breast cancer, chronic pain syndrome, paraplegia was noted to have a left gluteal decubitus ulcer and was brought into the hospital. During the course of her hospitalization she was debrided in the operating room by Dr. Leland Johns and had all the wounds sharply debrided. The debridement was done for the left ischial wound, the left heel wound and the right ankle wound. Bone cultures were taken at that time but were negative but clinically she was treated for osteomyelitis because of the probing down to bone and open exposed bone. Home health has been giving her antibioticss which include vancomycin and Zosyn. The patient was a smoker until about 3 weeks ago and used to smoke about 10 cigarettes a day for a long while. 12/13/2014 - details of her operative note from 11/03/2014 were reviewed -- PROCEDURE: 1. Excisional debridement skin, subcutaneous, muscle left ischium 35 cm2 2. Excisional debridement skin, subcutaneous tissue left heel 27 cm2 3. Excisional debridement right ankle skin, subcutaneous, bone 30 cm2 01/24/2015 -- she has  some issues with her wheelchair cushion but other than that is doing very well and has received Podus boots for her feet. 02/14/2015 -- she was using her old offloading boots and this seemed to have caused her a new pressure ulcer on the left posterior heel near the superior part just below the  Achilles tendon. 03/07/2015 -- she has a new ulceration just to the left of the midline on her sacral region more on the left buttock and this has been there for about a week. 08/22/2015 -- was recently admitted to hospital between May 5 and 08/13/2015, with sepsis and leukocytosis due to a UTI. she was treated for a sepsis complicating Escherichia coli UTI and kidney stones. She also had metabolic and careful up at the secondary to pyelonephritis. He received broad-spectrum antibiotics initially and then received Macrobid as per urology. She was sent home on nitrofurantoin. during her admission she had a CT scan which showed exposed left ischial tuberosity without evidence of osteolysis. 09/12/2015-- the patient is having some issues with her air mattress and would like to get a opinion from medical modalities. 10/10/2015 -- the issue with her air mattress has not yet been sorted out and the new problem seems to be a lot of odor from the wound VAC. 11/27/2015 -- the patient was admitted to the hospital between July 23 and 10/31/2015. Her problems were sepsis, osteomyelitis of the pelvic bone and acute pyelonephritis. CT of the abdomen and pelvis was consistent with a left- sided pyelonephritis with hydronephrosis and also just showed new sclerosis of the posterior portion of the left anterior pubic ramus suggestive of periosteal reaction consistent with osteomyelitis. She was treated for the osteomyelitis with infectious disease consult recommending 6 weeks of IV antibiotics including vancomycin and Rocephin and the antibiotics were to go on until 12/10/2015. He was seen by Dr. Iran Planas plastic surgery  and Dr. Linus Salmons of infectious disease. She had a suprapubic catheter placed during the admission. CT scan done on 10/28/2015 showed specifically -- New sclerosis of the posterior portion of the left inferior pubic ramus with aggressive periosteal reaction, consistent with osteomyelitis, with adjacent soft tissue gas compatible with previously described decubitus ulcer. 12/12/2015 -- she was recently seen by Dr. Linus Salmons, who noted good improvement and CRP and ESR compared to before and he has stopped her antibiotic as per plans to finish on September 4. The patient was encouraged to continue with wound care and consider hyperbaric oxygen therapy. Today she tells me that she has consented to undergo hyperbaric oxygen therapy and we can start the paperwork. 01/02/2016 -- her PCP had gained about 3 years but she still persists in having problems during hyperbaric oxygen therapy with some discomfort in the ears. 01/09/16; pressure area with underlying osteomyelitis in the left buttock. Wound bed itself has some slight amount of grayish surface slough however I do not think any debridement was necessary. There is no exposed bone soft tissue appears stable. She is using a wound VAC 01/16/16; back for weekly wound review in conjunction with HBO. She has a deep wound over the left initial tuberosity previously treated with 6 weeks of IV antibiotics for osteomyelitis. Wound bed looks reasonably healthy although the base of this is still precariously close to bone. She has been using a wound VAC. 01/23/2016 -- she has completed her course of antibiotics and this week the only new thing is her right great toe nail was avulsed and she has got an open wound over the nailbed. 01/31/16 she has completed her course of antibiotics. Her right great toenail avulsed last week and she's been using silver alginate for this as well. Still using a wound VAC to the substantial stage IV wound over the left  ischial tuberosity 03/05/2016 -- the patient has had a opinion from the plastic surgery group  at [facility] and details of this are not available yet but the patient's verbal report has been heard by me. Did not sound like there was any optimistic discussion regarding reconstruction and the net result would be to continue with the wound VAC application. I will await the official reports. Addendum: -- she was seen at [facility] surgery service by Dr. Tressa Busman. After a thorough review and from what I understand spending 45 minutes with the patient his assessment has been noted by me in detail and the management options were: 1. Continued pressure offloading and wound care versus operative procedures including wound excision 2. Soft tissue and bone sampling 3. If the wound gets larger wound closure would be done using a variety of plastic surgical techniques including but not limited to skin substitute, possible skin graft, local versus regional flaps, negative pressure dressing application. 4. He discussed with her details of flap surgery and the risks associated 5. He made a comment that since the patient was operated on by Dr. Leland Johns of [facility] plastic surgery unit in [place] the patient may continue to follow-up there for further evaluation for surgical flap closure in the future. 03/19/2016 -- the patient continues to be rather depressed and frustrated with her lack of rapid progress in healing this wound especially because she thought after hyperbaric oxygen therapy the wound would heal extremely fast. She now understands that was not the implied benefit on wound care which was the recommendation for hyperbaric oxygen therapy. I have had a lengthy discussion with the patient and her husband regarding her options: 1. Continue with collagen and wound VAC for the primary dressing and offloading and all supportive care. 2. See Dr. Iran Planas for  possible placement of Acell or Integra in the OR. 3. get a second opinion from a wound care center and surrounding regions/counties 05/07/2016 -- Note from Dr. Celedonio Miyamoto, who noted that the patient has declined flap surgery. She has discussed application of A cell, and try a few applications to see how the wound progresses. She is also recommended that we could apply products here in the wound center, like Oasis. during her preop workup it was found that her hemoglobin A1c was 11% and she has now been diagnosed as having diabetes mellitus and has been put on appropriate treatment by her PCP 05/28/2016 -- tells me her blood sugars have been doing well and she has an appointment to see her PCP in the next couple of weeks to check her hemoglobin A1c. Other than that she continues to do well. 06/25/2016 -- have not seen her back for the last month but she says her health has been about the same and she has an appointment to check the A1c next week 09/10/16 ---- was seen by Dr. Celedonio Miyamoto -- who applied Acell and saw her back in follow-up. She has recommended silver alginate to the wound every other day and cover with foam. If no significant drainage could transition to collagen every other day. She recommended discontinuing wound VAC. There were no plans to repeat application of Acell. The patient expressed that her husband could do the wound care as going to the Wound Ctr., would cost several $100 for each visit. 10/21/2016 -- her insurance company is getting her new mattress and she is pleased about that. Other than that she has been doing dressings with PolyMem Silver and has been doing very well 02/18/2017 -- she has gone through several changes of her  mattress and has not been pleased with any of them. The ventricles are still working on trying to fit her with the appropriate low air-loss mattress. She has a new wound on the gluteal area which is clearly separated from the original  wound. 03/25/17-she is here in follow-up evaluation for her left ischialpressure ulcer. She remains unsatisfied with her pressure mattress. She admits to sitting multiple hours a day, in the bed. We have discussed offloading options. The wound does not appear infected. Nutrition does not appear to be a concern. Will follow-up in 4 weeks, if wound continues to be stalled may consider x-ray to evaluate for refractory osteomyelitis. 04/21/17; this is a patient that I don't know all that well. She has a chronic wound which at one point had underlying osteomyelitis in the left ischial tuberosity. This is a stage IV pressure ulcer. Over the last 3 months she has a stage II wound inferiorly to the original wound. The last time she was here her dressing was changed to silver collagen although the patient's husband who changes the dressing said that the collagen stuck to the wound and remove skin from the superficial area therefore he switched back to [place] 05/13/17; this is a patient we've been following for a left ischial tuberosity wound which was stage IV at one point had underlying osteomyelitis. Over the last several months she's had a stage II wound just inferior and medial to the related to the wound. According to her husband he is using Endoform layer with collagen although this is not what I had last time. According to her husband they are using Elgie Congo with collagen although I don't quite know how that started. She was hospitalized from 1/20 through 04/30/16. This was related to a UTI. Her blood cultures were negative, urine culture showed multiple species. She did have a CT scan of the abdomen and pelvis which documented chronic osteomyelitis in the area of the wound inflammatory markers were unremarkable. She has had prior knowledge of osteomyelitis. It looks as though she received IV antibiotics in 2017 and was treated with a course of hyperbaric oxygen. 05/28/17; the wound over the  left ischial tuberosity is deeper today and abuts clearly on bone. Nursing intake reported drainage. I therefore culture of the wound. The more superficial area just below this looks about the same. They once again complained that there are mattress cover is not working although apparently [facility] is been noted to see this many times in the report is that the device is functional 06/18/17; the patient had a probing area on the left ischial tuberosity that was draining purulent fluid last time. This also clearly seemed to have open bone. Culture I did showed pansensitive pseudomonas including third generation cephalosporins. I treated this with cefdinir 300 twice a day for 10 days and things seem to have improved. She has a more superficial wound just underneath this area. Amazingly she has a new air mattress through [facility]. I think they gave this to her as a parking give. In any case this now works according to the patient may have something to do with why the areas are looking better. 07/09/17; the patient has a probing area in the left ischial tuberosity that still has some depth. However this is contracted in terms of the wound orifice although the depth is still roughly the same. There is no undermining. She also has the satellite wound which is more superficial. This appears to have a healthy surface  we've been using silver collagen 08/06/17; the patient's wound is over the left ischial tuberosity and a satellite lesion just underneath this. The original wound was actually a deep stage 4 wound. We have made good progress in 2 months and there is no longer exposed bone here. 09/03/17; left ischial tuberosity actually appears to be quite healthy. I think we are making progress. No debridement is required. There is no surrounding erythema 10/01/17 I follow this patient monthly for her left ischial tuberosity wound. There is 2 areas the original area and a satellite area. The  satellite area looks a lot better there is no surrounding erythema. Her husband relates that he is having trouble maintaining the dressing. This has to do with the soft tissue around it. He states he puts the collagen in but he cannot make sure that it stays in even with the ABD pads and tape that he is been using 10/29/17; patient arrives with a better looking noon today. Some of the satellite lesions have closed. using Prisma 11/26/17; the patient has a large cone-shaped area with the tip of the Cone deep within her buttock soft tissue. The walls of the Cone are epithelialized however the base is still open. The area at the base of this looks moist we've been using silver collagen. Will change to silver alginate 12/31/2017; the wound appears to have come in fairly nicely. Using silver alginate. There is no surrounding maceration or infection 01/28/18; there is still an open area here over the left initial tuberosity. Base of this however looks healthy. There is no surrounding infection 02/25/18; the area of its open is over the left ischial tuberosity. The base of this is where the wound is. This is a large inverted cone-shaped area with the wound at the tip. Dimensions of the wound at the tip are improved. There is a area of denuded skin about halfway towards the tip which her husband thinks may have happened today when he was bathing her. 04/20/17; the area is still open over the left initial tuberosity. 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 This is now completely closed over although it is closed over with a divot and skin over bone at the base of this. Continued aggressive offloading will be necessary. 12/30/2018 READMISSION This is a 68 year old woman with chronic paraplegia. I picked her up for  her care from Dr. Con Memos in this clinic after he departed. She had a stage IV pressure wound over the left ischial tuberosity. She was treated twice for her underlying osteomyelitis and this I believe firstly in 2016 and again in 2017. There were some plans at some point for flap closure of this however she was discovered to have uncontrolled diabetes and I do not think this was ever accomplished. She ultimately healed over in this clinic and was discharged in May. She has a large cone-shaped indentation with the tip of this going towards the left ischial tuberosity. It is not an easy area to examine but at that time I thought all of this was epithelialized. Apparently there was a reopening here shortly after she left the clinic last time. She was admitted to hospital at the end of June for Klebsiella bacteremia felt to be secondary to UTI. A CT scan of the pelvis is listed below and  there was initially some concern that she had underlying osteomyelitis although I believe she was seen by infectious disease and that was felt to be not the case: I do not see any new cultures or inflammatory markers IMPRESSION: 1. No CT evidence for acute intra-abdominal or pelvic abnormality. Large volume of stool throughout the colon. 2. Enlarged fatty liver with fat sparing near the gallbladder fossa 3. Cortical scarring right kidney. Bilateral intrarenal stones without hydronephrosis. Thick-walled urinary bladder decompressed by suprapubic catheter 4. Deep left decubitus ulcer with underlying left ischial changes suggesting osteomyelitis. Her husband has been using silver collagen in the wound. She has not been systemically unwell no fever chills eating and drinking well. They rigorously offload this wound only getting up in the wheelchair when she is going to appointments the rest of the time she is in bed. 10/8; wound measures larger and she now has exposed bone. We have been using silver  alginate Electronic Signature(s) Signed: 01/13/2019 5:49:31 PM By: Linton Ham MD Entered By: Linton Ham on 01/13/2019 11:37:03 -------------------------------------------------------------------------------- Physical Exam Details Patient Name: Date of Service: [name], [name] 01/13/2019 10:15 AM Medical Record [id number] Patient Account Number: [id number] Date of Birth/Sex: Treating RN: [birth year] (68 y.o. [name] Primary Care Provider: [name] Other Clinician: Referring Provider: Treating Provider/Extender:[name], [name], [name] in Treatment: 2 Constitutional Sitting or standing Blood Pressure is within target range for patient.. Pulse regular and within target range for patient.Marland Kitchen Respirations regular, non-labored and within target range.. Temperature is normal and within the target range for the patient.Marland Kitchen Appears in no distress. Notes Wound exam The area in question is on the left ischial tuberosity. The wound is in the base of the deep cone-shaped area from her previous wound that we healed out of May of this year. As opposed to last time there is definitely palpable bone here. Using a #3 curette the area of bone debrided specimen obtained for CandS. There is no clear soft tissue infection Electronic Signature(s) Signed: 01/13/2019 5:49:31 PM By: Linton Ham MD Entered By: Linton Ham on 01/13/2019 11:38:12 -------------------------------------------------------------------------------- Physician Orders Details Patient Name: Date of Service: [name], [name] 01/13/2019 10:15 AM Medical Record [id number] Patient Account Number: [id number] Date of Birth/Sex: Treating RN: September 09, 1950 (68 y.o. [name], [name] Primary Care Provider: [name] Other Clinician: Referring Provider: Treating Provider/Extender:[name], [name], [name] in Treatment: 2 Verbal / Phone Orders: No Diagnosis Coding ICD-10 Coding Code  Description [phone] Pressure ulcer of left buttock, stage 4 G82.20 Paraplegia, unspecified E11.622 Type 2 diabetes mellitus with other skin ulcer Follow-up Appointments Return Appointment in 2 weeks. Dressing Change Frequency Change Dressing every other day. Skin Barriers/Peri-Wound Care Skin Prep - to periwound. Barrier cream - as needed for maceration, wetness, or redness to periwound. Wound Cleansing Wound #12 Left Ischial Tuberosity May shower and wash wound with soap and water. - or wound cleanser Primary Wound Dressing Wound #12 Left Ischial Tuberosity Calcium Alginate with Silver - back with gauze. Secondary Dressing Wound #12 Left Ischial Tuberosity ABD pad - or bordered foam. Off-Loading Low air-loss mattress (Group 2) - continue to use Gel wheelchair cushion - continue to use specialty wheelchair cushion. Turn and reposition every 2 hours Laboratory Bacteria identified in Unspecified specimen by Aerobe culture (MICRO) - bone culture taken. someone will call with any abnormal results. LOINC Code: 030-0 Convenience Name: Areobic culture-specimen not specified Electronic Signature(s) Signed: 01/13/2019 5:49:31 PM By: Linton Ham MD Signed: 01/13/2019 6:06:48 PM By: Deon Pilling Entered By: Deon Pilling on  01/13/2019 11:35:43 -------------------------------------------------------------------------------- Problem List Details Patient Name: Date of Service: [name], [name] 01/13/2019 10:15 AM Medical Record Number:[id number] Patient Account Number: [id number] Date of Birth/Sex: Treating RN: 05-24-1950 (68 y.o. [name], [name] Primary Care Provider: [name] Other Clinician: Referring Provider: Treating Provider/Extender:[name], [name], [name] in Treatment: 2 Active Problems ICD-10 Evaluated Encounter Code Description Active Date Today Diagnosis 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, stage 4 12/30/2018 No Yes G82.20 Paraplegia, unspecified 12/30/2018  No Yes E11.622 Type 2 diabetes mellitus with other skin ulcer 12/30/2018 No Yes Inactive Problems Resolved Problems Electronic Signature(s) Signed: 01/13/2019 5:49:31 PM By: Linton Ham MD Entered By: Linton Ham on 01/13/2019 11:36:08 -------------------------------------------------------------------------------- Progress Note Details Patient Name: Date of Service: [name], [name] 01/13/2019 10:15 AM Medical Record [id number] Patient Account Number: [id number] Date of Birth/Sex: Treating RN: 22-Dec-1950 (68 y.o. [name], [name] Primary Care Provider: [name] Other Clinician: Referring Provider: Treating Provider/Extender:[name], [name], [name] in Treatment: 2 Subjective History of Present Illness (HPI) The following HPI elements were documented for the patient's wound: Location: open ulceration of the left gluteal area, left heel and right ankle for about 5 months. Quality: Patient reports No Pain. Severity: Patient states wound(s) are getting worse. Duration: Patient has had the wound for > 5 months prior to seeking treatment at the wound center Context: The wound occurred when the patient has been paraplegic for about 3 years. Modifying Factors: Wound improving due to current treatment. Associated Signs and Symptoms: Patient reports having foul odor. this 68 year old patient who is known to have hypertension, hypothyroidism, breast cancer, chronic pain syndrome, paraplegia was noted to have a left gluteal decubitus ulcer and was brought into the hospital. During the course of her hospitalization she was debrided in the operating room by Dr. Leland Johns and had all the wounds sharply debrided. The debridement was done for the left ischial wound, the left heel wound and the right ankle wound. Bone cultures were taken at that time but were negative but clinically she was treated for osteomyelitis because of the probing down to bone and open exposed bone. Home  health has been giving her antibioticss which include vancomycin and Zosyn. The patient was a smoker until about 3 weeks ago and used to smoke about 10 cigarettes a day for a long while. 12/13/2014 - details of her operative note from 11/03/2014 were reviewed -- PROCEDURE: 1. Excisional debridement skin, subcutaneous, muscle left ischium 35 cm2 2. Excisional debridement skin, subcutaneous tissue left heel 27 cm2 3. Excisional debridement right ankle skin, subcutaneous, bone 30 cm2 01/24/2015 -- she has some issues with her wheelchair cushion but other than that is doing very well and has received Podus boots for her feet. 02/14/2015 -- she was using her old offloading boots and this seemed to have caused her a new pressure ulcer on the left posterior heel near the superior part just below the Achilles tendon. 03/07/2015 -- she has a new ulceration just to the left of the midline on her sacral region more on the left buttock and this has been there for about a week. 08/22/2015 -- was recently admitted to hospital between May 5 and 08/13/2015, with sepsis and leukocytosis due to a UTI. she was treated for a sepsis complicating Escherichia coli UTI and kidney stones. She also had metabolic and careful up at the secondary to pyelonephritis. He received broad-spectrum antibiotics initially and then received Macrobid as per urology. She was sent home on nitrofurantoin. during her admission she had a CT scan  which showed exposed left ischial tuberosity without evidence of osteolysis. 09/12/2015-- the patient is having some issues with her air mattress and would like to get a opinion from medical modalities. 10/10/2015 -- the issue with her air mattress has not yet been sorted out and the new problem seems to be a lot of odor from the wound VAC. 11/27/2015 -- the patient was admitted to the hospital between July 23 and 10/31/2015. Her problems were sepsis, osteomyelitis of the pelvic bone and acute  pyelonephritis. CT of the abdomen and pelvis was consistent with a left- sided pyelonephritis with hydronephrosis and also just showed new sclerosis of the posterior portion of the left anterior pubic ramus suggestive of periosteal reaction consistent with osteomyelitis. She was treated for the osteomyelitis with infectious disease consult recommending 6 weeks of IV antibiotics including vancomycin and Rocephin and the antibiotics were to go on until 12/10/2015. He was seen by Dr. Iran Planas plastic surgery and Dr. Linus Salmons of infectious disease. She had a suprapubic catheter placed during the admission. CT scan done on 10/28/2015 showed specifically -- New sclerosis of the posterior portion of the left inferior pubic ramus with aggressive periosteal reaction, consistent with osteomyelitis, with adjacent soft tissue gas compatible with previously described decubitus ulcer. 12/12/2015 -- she was recently seen by Dr. Linus Salmons, who noted good improvement and CRP and ESR compared to before and he has stopped her antibiotic as per plans to finish on September 4. The patient was encouraged to continue with wound care and consider hyperbaric oxygen therapy. Today she tells me that she has consented to undergo hyperbaric oxygen therapy and we can start the paperwork. 01/02/2016 -- her PCP had gained about 3 years but she still persists in having problems during hyperbaric oxygen therapy with some discomfort in the ears. 01/09/16; pressure area with underlying osteomyelitis in the left buttock. Wound bed itself has some slight amount of grayish surface slough however I do not think any debridement was necessary. There is no exposed bone soft tissue appears stable. She is using a wound VAC 01/16/16; back for weekly wound review in conjunction with HBO. She has a deep wound over the left initial tuberosity previously treated with 6 weeks of IV antibiotics for osteomyelitis. Wound bed looks reasonably  healthy although the base of this is still precariously close to bone. She has been using a wound VAC. 01/23/2016 -- she has completed her course of antibiotics and this week the only new thing is her right great toe nail was avulsed and she has got an open wound over the nailbed. 01/31/16 she has completed her course of antibiotics. Her right great toenail avulsed last week and she's been using silver alginate for this as well. Still using a wound VAC to the substantial stage IV wound over the left ischial tuberosity 03/05/2016 -- the patient has had a opinion from the plastic surgery group at [facility] and details of this are not available yet but the patient's verbal report has been heard by me. Did not sound like there was any optimistic discussion regarding reconstruction and the net result would be to continue with the wound VAC application. I will await the official reports. Addendum: -- she was seen at [facility] by Dr. Tressa Busman. After a thorough review and from what I understand spending 45 minutes with the patient his assessment has been noted by me in detail and the management options were: 1. Continued pressure offloading and wound care versus  operative procedures including wound excision 2. Soft tissue and bone sampling 3. If the wound gets larger wound closure would be done using a variety of plastic surgical techniques including but not limited to skin substitute, possible skin graft, local versus regional flaps, negative pressure dressing application. 4. He discussed with her details of flap surgery and the risks associated 5. He made a comment that since the patient was operated on by Dr. Leland Johns of [facility] plastic surgery unit in [place] the patient may continue to follow-up there for further evaluation for surgical flap closure in the future. 03/19/2016 -- the patient continues to be rather depressed and  frustrated with her lack of rapid progress in healing this wound especially because she thought after hyperbaric oxygen therapy the wound would heal extremely fast. She now understands that was not the implied benefit on wound care which was the recommendation for hyperbaric oxygen therapy. I have had a lengthy discussion with the patient and her husband regarding her options: 1. Continue with collagen and wound VAC for the primary dressing and offloading and all supportive care. 2. See Dr. Iran Planas for possible placement of Acell or Integra in the OR. 3. get a second opinion from a wound care center and surrounding regions/counties 05/07/2016 -- Note from Dr. Celedonio Miyamoto, who noted that the patient has declined flap surgery. She has discussed application of A cell, and try a few applications to see how the wound progresses. She is also recommended that we could apply products here in the wound center, like Oasis. during her preop workup it was found that her hemoglobin A1c was 11% and she has now been diagnosed as having diabetes mellitus and has been put on appropriate treatment by her PCP 05/28/2016 -- tells me her blood sugars have been doing well and she has an appointment to see her PCP in the next couple of weeks to check her hemoglobin A1c. Other than that she continues to do well. 06/25/2016 -- have not seen her back for the last month but she says her health has been about the same and she has an appointment to check the A1c next week 09/10/16 ---- was seen by Dr. Celedonio Miyamoto -- who applied Acell and saw her back in follow-up. She has recommended silver alginate to the wound every other day and cover with foam. If no significant drainage could transition to collagen every other day. She recommended discontinuing wound VAC. There were no plans to repeat application of Acell. The patient expressed that her husband could do the wound care as going to the Wound Ctr., would cost  several $100 for each visit. 10/21/2016 -- her insurance company is getting her new mattress and she is pleased about that. Other than that she has been doing dressings with PolyMem Silver and has been doing very well 02/18/2017 -- she has gone through several changes of her mattress and has not been pleased with any of them. The ventricles are still working on trying to fit her with the appropriate low air-loss mattress. She has a new wound on the gluteal area which is clearly separated from the original wound. 03/25/17-she is here in follow-up evaluation for her left ischialpressure ulcer. She remains unsatisfied with her pressure mattress. She admits to sitting multiple hours a day, in the bed. We have discussed offloading options. The wound does not appear infected. Nutrition does not appear to be a concern. Will follow-up in 4 weeks, if wound continues to be stalled may consider  x-ray to evaluate for refractory osteomyelitis. 04/21/17; this is a patient that I don't know all that well. She has a chronic wound which at one point had underlying osteomyelitis in the left ischial tuberosity. This is a stage IV pressure ulcer. Over the last 3 months she has a stage II wound inferiorly to the original wound. The last time she was here her dressing was changed to silver collagen although the patient's husband who changes the dressing said that the collagen stuck to the wound and remove skin from the superficial area therefore he switched back to [place] 05/13/17; this is a patient we've been following for a left ischial tuberosity wound which was stage IV at one point had underlying osteomyelitis. Over the last several months she's had a stage II wound just inferior and medial to the related to the wound. According to her husband he is using Endoform layer with collagen although this is not what I had last time. According to her husband they are using Elgie Congo with collagen although I don't  quite know how that started. She was hospitalized from 1/20 through 04/30/16. This was related to a UTI. Her blood cultures were negative, urine culture showed multiple species. She did have a CT scan of the abdomen and pelvis which documented chronic osteomyelitis in the area of the wound inflammatory markers were unremarkable. She has had prior knowledge of osteomyelitis. It looks as though she received IV antibiotics in 2017 and was treated with a course of hyperbaric oxygen. 05/28/17; the wound over the left ischial tuberosity is deeper today and abuts clearly on bone. Nursing intake reported drainage. I therefore culture of the wound. The more superficial area just below this looks about the same. They once again complained that there are mattress cover is not working although apparently [facility] is been noted to see this many times in the report is that the device is functional 06/18/17; the patient had a probing area on the left ischial tuberosity that was draining purulent fluid last time. This also clearly seemed to have open bone. Culture I did showed pansensitive pseudomonas including third generation cephalosporins. I treated this with cefdinir 300 twice a day for 10 days and things seem to have improved. She has a more superficial wound just underneath this area. Amazingly she has a new air mattress through [facility]. I think they gave this to her as a parking give. In any case this now works according to the patient may have something to do with why the areas are looking better. 07/09/17; the patient has a probing area in the left ischial tuberosity that still has some depth. However this is contracted in terms of the wound orifice although the depth is still roughly the same. There is no undermining. ooShe also has the satellite wound which is more superficial. This appears to have a healthy surface we've been using silver collagen 08/06/17; the patient's wound is  over the left ischial tuberosity and a satellite lesion just underneath this. The original wound was actually a deep stage 4 wound. We have made good progress in 2 months and there is no longer exposed bone here. 09/03/17; left ischial tuberosity actually appears to be quite healthy. I think we are making progress. No debridement is required. There is no surrounding erythema 10/01/17 I follow this patient monthly for her left ischial tuberosity wound. There is 2 areas the original area and a satellite area. The satellite area looks a lot better there  is no surrounding erythema. Her husband relates that he is having trouble maintaining the dressing. This has to do with the soft tissue around it. He states he puts the collagen in but he cannot make sure that it stays in even with the ABD pads and tape that he is been using 10/29/17; patient arrives with a better looking noon today. Some of the satellite lesions have closed. using Prisma 11/26/17; the patient has a large cone-shaped area with the tip of the Cone deep within her buttock soft tissue. The walls of the Cone are epithelialized however the base is still open. The area at the base of this looks moist we've been using silver collagen. Will change to silver alginate 12/31/2017; the wound appears to have come in fairly nicely. Using silver alginate. There is no surrounding maceration or infection 01/28/18; there is still an open area here over the left initial tuberosity. Base of this however looks healthy. There is no surrounding infection 02/25/18; the area of its open is over the left ischial tuberosity. The base of this is where the wound is. This is a large inverted cone-shaped area with the wound at the tip. Dimensions of the wound at the tip are improved. There is a area of denuded skin about halfway towards the tip which her husband thinks may have happened today when he was bathing her. 04/20/17; the area is still open over the left  initial tuberosity. 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 This is now completely closed over although it is closed over with a divot and skin over bone at the base of this. Continued aggressive offloading will be necessary. 12/30/2018 READMISSION This is a 68 year old woman with chronic paraplegia. I picked her up for her care from Dr. Con Memos in this clinic after he departed. She had a stage IV pressure wound over the left ischial tuberosity. She was treated twice for her underlying osteomyelitis and this I believe firstly in 2016 and again in 2017. There were some plans at some point for flap closure of this however she was discovered to have uncontrolled diabetes and I do not think this was ever accomplished. She ultimately healed over in this clinic and was discharged in May. She has a large cone-shaped indentation with the tip of this going towards the left ischial tuberosity. It is not an easy area to examine but at that time I thought all of this was epithelialized. Apparently there was a reopening here shortly after she left the clinic last time. She was admitted to hospital at the end of June for Klebsiella bacteremia felt to be secondary to UTI. A CT scan of the pelvis is listed below and there was initially some concern that she had underlying osteomyelitis although I believe she was seen by infectious disease and that was felt to be not the case: I do not see any new cultures or inflammatory markers IMPRESSION: 1. No CT evidence for acute intra-abdominal or pelvic abnormality. Large volume of stool throughout the colon. 2. Enlarged fatty liver with fat sparing near the gallbladder fossa 3. Cortical scarring right kidney. Bilateral intrarenal stones without hydronephrosis. Thick-walled  urinary bladder decompressed by suprapubic catheter 4. Deep left decubitus ulcer with underlying left ischial changes suggesting osteomyelitis. Her husband has been using silver collagen in  the wound. She has not been systemically unwell no fever chills eating and drinking well. They rigorously offload this wound only getting up in the wheelchair when she is going to appointments the rest of the time she is in bed. 10/8; wound measures larger and she now has exposed bone. We have been using silver alginate Objective Constitutional Sitting or standing Blood Pressure is within target range for patient.. Pulse regular and within target range for patient.Marland Kitchen Respirations regular, non-labored and within target range.. Temperature is normal and within the target range for the patient.Marland Kitchen Appears in no distress. Vitals Time Taken: 10:13 AM, Height: 63 in, Weight: 185 lbs, BMI: 32.8, Temperature: 98.1 F, Pulse: 87 bpm, Respiratory Rate: 17 breaths/min, Blood Pressure: 127/77 mmHg. General Notes: Wound exam ooThe area in question is on the left ischial tuberosity. The wound is in the base of the deep cone-shaped area from her previous wound that we healed out of May of this year. As opposed to last time there is definitely palpable bone here. Using a #3 curette the area of bone debrided specimen obtained for CandS. There is no clear soft tissue infection Integumentary (Hair, Skin) Wound #12 status is Open. Original cause of wound was Pressure Injury. The wound is located on the Left Ischial Tuberosity. The wound measures 1cm length x 1.6cm width x 1cm depth; 1.257cm^2 area and 1.257cm^3 volume. There is Fat Layer (Subcutaneous Tissue) Exposed exposed. There is no tunneling or undermining noted. There is a small amount of serosanguineous drainage noted. The wound margin is well defined and not attached to the wound base. There is medium (34-66%) pink granulation within the wound bed. There is a medium  (34-66%) amount of necrotic tissue within the wound bed including Adherent Slough. Assessment Active Problems ICD-10 Pressure ulcer of left buttock, stage 4 Paraplegia, unspecified Type 2 diabetes mellitus with other skin ulcer Procedures Wound #12 Pre-procedure diagnosis of Wound #12 is a Pressure Ulcer located on the Left Ischial Tuberosity . There was a Excisional Skin/Subcutaneous Tissue/Muscle/Bone Debridement with a total area of 1.6 sq cm performed by Ricard Dillon., MD. With the following instrument(s): Curette to remove Viable and Non-Viable tissue/material. Material removed includes Bone,Subcutaneous Tissue, Slough, Skin: Dermis, and Fibrin/Exudate after achieving pain control using Lidocaine 5% topical ointment. 1 specimen was taken by a Tissue Culture and sent to the lab per facility protocol. A time out was conducted at 11:29, prior to the start of the procedure. A Moderate amount of bleeding was controlled with Pressure. The procedure was tolerated well with a pain level of 0 throughout and a pain level of 2 following the procedure. Post Debridement Measurements: 1cm length x 1.6cm width x 1cm depth; 1.257cm^3 volume. Post debridement Stage noted as Category/Stage IV. Character of Wound/Ulcer Post Debridement is improved. Post procedure Diagnosis Wound #12: Same as Pre-Procedure Plan Follow-up Appointments: Return Appointment in 2 weeks. Dressing Change Frequency: Change Dressing every other day. Skin Barriers/Peri-Wound Care: Skin Prep - to periwound. Barrier cream - as needed for maceration, wetness, or redness to periwound. Wound Cleansing: Wound #12 Left Ischial Tuberosity: May shower and wash wound with soap and water. - or wound cleanser Primary Wound Dressing: Wound #12 Left Ischial Tuberosity: Calcium Alginate with Silver - back with gauze. Secondary Dressing: Wound #12 Left Ischial Tuberosity: ABD pad - or bordered foam. Off-Loading: Low air-loss  mattress (Group 2) - continue to use Gel wheelchair cushion - continue to use specialty wheelchair cushion. Turn and reposition every 2 hours Laboratory ordered  were: Areobic culture-specimen not specified - bone culture taken. someone will call with any abnormal results. 1. Bone and debridement. Specimen for culture only 2. Continue silver alginate and rigorous offloading Electronic Signature(s) Signed: 01/13/2019 5:49:31 PM By: Linton Ham MD Entered By: Linton Ham on 01/13/2019 11:39:00 -------------------------------------------------------------------------------- SuperBill Details Patient Name: Date of Service: DARILYN, [name] 01/13/2019 Medical Record [id number] Patient Account Number: [id number] Date of Birth/Sex: Treating RN: 1951/02/26 (68 y.o. [name], [name] Primary Care Provider: [name] Other Clinician: Referring Provider: Treating Provider/Extender:[name], [name], [name] in Treatment: 2 Diagnosis Coding ICD-10 Codes Code Description 2393221123 Pressure ulcer of left buttock, stage 4 G82.20 Paraplegia, unspecified E11.622 Type 2 diabetes mellitus with other skin ulcer Facility Procedures The patient participates with Medicare or their insurance follows the Medicare Facility Guidelines: CPT4 Code Description Modifier Quantity [id number] 11044 - DEB BONE 20 SQ CM/< 1 ICD-10 Diagnosis Description 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, stage  4 Physician Procedures CPT4 Description: Code 5789784 Debridement; bone (includes epidermis, dermis, subQ tissue, muscle and/or fascia, if performed) 1st 20 sqcm or less ICD-10 Diagnosis Description 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, stage 4 Modifier: Quantity: 1 Electronic Signature(s) Signed: 01/13/2019 5:49:31 PM By: Linton Ham MD Entered By: Linton Ham on 01/13/2019 11:39:12

## 2019-01-16 DIAGNOSIS — I5033 Acute on chronic diastolic (congestive) heart failure: Secondary | ICD-10-CM | POA: Diagnosis not present

## 2019-01-19 ENCOUNTER — Other Ambulatory Visit: Payer: Self-pay | Admitting: *Deleted

## 2019-01-19 LAB — AEROBIC/ANAEROBIC CULTURE W GRAM STAIN (SURGICAL/DEEP WOUND): Gram Stain: NONE SEEN

## 2019-01-19 NOTE — Patient Outreach (Signed)
Telelphone outreach to primary care provider to request palliative care consult be ordered, per request in August while in ED at [facility] and per [facility] interdisciplinary committee for pt benefit and support. Left this message with office staff for Dr. [name] and requested a call back for his response to the request.  Kayleen Memos C. Myrtie Neither, MSN, [facility] Gerontological Nurse Practitioner [facility] Care Management [phone]

## 2019-01-26 ENCOUNTER — Ambulatory Visit (INDEPENDENT_AMBULATORY_CARE_PROVIDER_SITE_OTHER): Payer: PPO | Admitting: Internal Medicine

## 2019-01-26 ENCOUNTER — Encounter: Payer: Self-pay | Admitting: Internal Medicine

## 2019-01-26 ENCOUNTER — Other Ambulatory Visit: Payer: Self-pay

## 2019-01-26 DIAGNOSIS — G629 Polyneuropathy, unspecified: Secondary | ICD-10-CM | POA: Insufficient documentation

## 2019-01-26 DIAGNOSIS — M86652 Other chronic osteomyelitis, left thigh: Secondary | ICD-10-CM

## 2019-01-26 DIAGNOSIS — Z87891 Personal history of nicotine dependence: Secondary | ICD-10-CM | POA: Diagnosis not present

## 2019-01-26 MED ORDER — AMOXICILLIN-POT CLAVULANATE 500-125 MG PO TABS: 1.0000 | ORAL_TABLET | Freq: Two times a day (BID) | ORAL | 1 refills | Status: DC

## 2019-01-26 NOTE — Progress Notes (Signed)
[facility]  Reason for Consult: Recurrent left sacral pressure sore complicating chronic left ischial osteomyelitis Referring Provider: Dr. Dossie Der  Assessment: The exact precipitating cause for this new ulcer is uncertain.  They are not aware of any change in wound care, pressure or nutrition that could have precipitated it.  It is possible that she has underlying, chronic active osteomyelitis but the radiographic findings seen on CT scan in June may reflect old changes from her infection in January of last year.  Likewise, it is difficult to know if the biopsy cultures represent active osteomyelitis or colonizing organisms.  I discussed treatment options with them including continued wound care without antibiotics, oral antibiotic therapy with amoxicillin clavulanate or PICC placement and IV ertapenem.  She prefers to try an initial course of oral amoxicillin clavulanate.  Plan: 1. BMP, sed rate and C-reactive protein today 2. Start amoxicillin clavulanate 500 mg twice daily 3. Follow-up here in 4 weeks  Patient Active Problem List   Diagnosis Date Noted   Left perineal ischial pressure ulcer 09/16/2018    Priority: High   Chronic osteomyelitis of pelvic region, left ([place]) 11/06/2014    Priority: High   Former smoker 01/26/2019   Neuropathy 01/26/2019   Adjustment disorder with disturbance of emotion 12/02/2018   Palliative care by specialist    Advanced care planning/counseling discussion    Spondylosis, cervical, with myelopathy    Diabetes type 2, uncontrolled ([place]) 04/26/2017   Urinary tract infection associated with cystostomy catheter ([place])    Medication monitoring encounter 12/03/2015   Hypertension    History of nephrolithiasis    AKI (acute kidney injury) ([place]) 08/10/2015   Paraplegia ([place]) 11/01/2014   Hypothyroidism 10/31/2014   Breast cancer of lower-inner quadrant of right female breast ([place]) 12/22/2012     HTN (hypertension) 04/15/2012   Left knee DJD 04/13/2012    Class: Chronic    Patient's Medications  New Prescriptions   AMOXICILLIN-CLAVULANATE (AUGMENTIN) 500-125 MG TABLET    Take 1 tablet (500 mg total) by mouth 2 (two) times daily.  Previous Medications   ASCORBIC ACID (VITAMIN C) 500 MG TABLET    Take 1 tablet (500 mg total) by mouth daily.   BACLOFEN (LIORESAL) 20 MG TABLET    Take 20 mg by mouth every 6 (six) hours as needed for muscle spasms.    BISACODYL (DULCOLAX) 10 MG SUPPOSITORY    Place 1 suppository (10 mg total) rectally daily as needed for severe constipation.   DOCUSATE SODIUM (COLACE) 100 MG CAPSULE    Take 100 mg by mouth 2 (two) times daily as needed (FOR CONSTIPATION).    DULOXETINE (CYMBALTA) 60 MG CAPSULE    Take 120 mg by mouth daily.    FERROUS SULFATE 325 (65 FE) MG TABLET    Take 325 mg by mouth daily after supper.    FOLIC ACID (FOLVITE) 1 MG TABLET    Take 1 mg by mouth daily.    FUROSEMIDE (LASIX) 40 MG TABLET    80mg  in AM and 40mg  in PM till 09/26/18, then 80mg  in AM from 09/27/18 till 10/03/18, then 40mg  in AM from 10/04/18 onwards   LEVOTHYROXINE (SYNTHROID, LEVOTHROID) 125 MCG TABLET    Take 1 tablet (125 mcg total) by mouth daily before breakfast.   METFORMIN (GLUCOPHAGE) 500 MG TABLET    Take 250 mg by mouth 2 (two) times daily with a meal.   METOPROLOL (LOPRESSOR) 100 MG  TABLET    Take 100 mg by mouth 2 (two) times daily.    MOMETASONE-FORMOTEROL (DULERA) 100-5 MCG/ACT AERO    Inhale 2 puffs into the lungs 2 (two) times daily.   MULTIPLE VITAMIN (MULTIVITAMIN WITH MINERALS) TABS TABLET    Take 1 tablet by mouth daily.   NIFEDIPINE (PROCARDIA XL/ADALAT-CC) 30 MG 24 HR TABLET    Take 30 mg by mouth daily after supper.    OXYCODONE-ACETAMINOPHEN (PERCOCET) 10-325 MG TABLET    Take 1 tablet by mouth every 6 (six) hours as needed for pain.    POLYETHYLENE GLYCOL (MIRALAX / GLYCOLAX) 17 G PACKET    Take 17 g by mouth 2 (two) times daily.   PREGABALIN  (LYRICA) 75 MG CAPSULE    Take 75 mg by mouth 2 (two) times daily.    SENNA-DOCUSATE (SENOKOT-S) 8.6-50 MG TABLET    Take 1 tablet by mouth 2 (two) times daily.   ZINC SULFATE 220 MG CAPSULE    Take 1 capsule (220 mg total) by mouth daily.  Modified Medications   No medications on file  Discontinued Medications   IRBESARTAN (AVAPRO) 75 MG TABLET    Take 2 tablets (150 mg total) by mouth daily.    HPI: [name] is a 68 y.o. female with multiple medical problems who underwent cervical spine surgery many years ago leaving her with paraplegia.  She has had trouble with recurrent left sacral pressure sores over the past 3 to 4 years.  She was hospitalized in January 2019 and underwent debridement.  Apparently cultures were negative at that time but she was treated with 6 weeks of IV antibiotics for presumed ischial osteomyelitis.  She was also evaluated by plastic surgery at [facility].  Apparently flap closure of the wound was planned but never performed.  She had poorly controlled diabetes at that time but has been able to get it under much better control.  She also stopped smoking cigarettes.  Her wound finally closed earlier this year and she was discharged from the wound center in May.  She was hospitalized this past June with a Klebsiella UTI and bacteremia.  A CT scan done on 09/25/2018 showed slight irregular appearance of the left ischium compatible with osteomyelitis.  This was very similar to the findings on the CT scan done in January 2019.  She was seen by my partner, Dr. Linus Salmons, at that time who did not find any clinical evidence of infection and recommended focusing on treatment for the bacteremic UTI.  Over the past 6 to 8 weeks she has had a new area on her left buttock open up.  She went back to the wound center on 12/30/2018 and underwent some debridement of the wound.  She saw her urologist on 01/04/2019 for routine change of her suprapubic catheter.  She commented on having some  burning suprapubic pain and malodorous urine at that time.  A urine culture was done which grew E. coli and Enterobacter.  She was treated with ciprofloxacin for 7 days and improved.  She returned to the wound center on 01/13/2019 and Dr. Dellia Nims noted some palpable bone at the base of the wound at that time and performed a biopsy.  Gram stain of the bone did not show any organisms but cultures grew methicillin susceptible staph capitis and Enterococcus.  Her husband, who does dressing changes on a regular basis, does not feel like the wound has gotten any worse recently.  He has not noted any unusual odor  or increased drainage.  Both of them expressed some concern that she may have intermittently had some fecal soiling of the wound.  They do not think that she has been leaking any urine from her suprapubic catheter.  Review of Systems: Review of Systems  Constitutional: Negative for chills, diaphoresis, fever and weight loss.  Respiratory: Negative for cough and wheezing.   Cardiovascular: Negative for chest pain.  Gastrointestinal: Negative for abdominal pain, diarrhea, nausea and vomiting.  Neurological: Positive for sensory change.      Past Medical History:  Diagnosis Date   Anemia    Arthritis    "all over" (04/13/2012)   Cancer of right breast ([place]) 2011   Cervical neuropathy    PERIPHERAL NEUROPATHY , HAD CERVICAL FUSION   Diabetes mellitus without complication ([place])    Difficult intubation    Fastrach #4 LMA then # 7 ETT used in 2006 [facility], cervical laminectomy)   GERD (gastroesophageal reflux disease)    History of kidney stones    Hypercholesteremia    Hypertension    Hypothyroidism    Kidney stones    Neurogenic bladder    Osteomyelitis (HCC)    Paraplegia (HCC)    Peripheral neuropathy    PONV (postoperative nausea and vomiting)    Sacral decubitus ulcer 10/2014   Sepsis due to urinary tract infection ([place])     Social History   Tobacco Use    Smoking status: Former Smoker    Packs/day: 0.50    Years: 50.00    Pack years: 25.00    Types: Cigarettes    Quit date: 11/06/2014    Years since quitting: 4.2   Smokeless tobacco: Never Used  Substance Use Topics   Alcohol use: No   Drug use: No    Family History  Problem Relation Age of Onset   Heart disease Father    Allergies  Allergen Reactions   Ivp Dye [Iodinated Diagnostic Agents] Other (See Comments)    "hot and sweaty and almost passed out"   Aspirin Hives   Sulfa Antibiotics Hives    OBJECTIVE: Vitals:   01/26/19 1357  BP: 108/67  Pulse: 85  Temp: 97.9 F (36.6 C)   There is no height or weight on file to calculate BMI.   Physical Exam Constitutional:      Comments: She is a very pleasant and friendly person.  She is accompanied by her husband.  She is seated in a wheelchair.  Musculoskeletal:     Comments: She is wearing diapers.  There is a fair amount of dry dark drainage on her gauze dressing that has been on for 2 days.  She has extensive scarring of the buttocks.  There is a small depressed ulcer on the left buttock.  There is no expressible drainage or odor.  I do not see any exposed bone but can palpate bone at the base of the wound.    No results found for this or any previous visit (from the past 240 hour(s)).  Michel Bickers, MD [facility] [place] [facility] [phone] pager   [phone] cell 01/26/2019, 4:54 PM

## 2019-01-27 ENCOUNTER — Other Ambulatory Visit: Payer: Self-pay | Admitting: *Deleted

## 2019-01-27 ENCOUNTER — Encounter (HOSPITAL_BASED_OUTPATIENT_CLINIC_OR_DEPARTMENT_OTHER): Payer: PPO | Admitting: Internal Medicine

## 2019-01-27 LAB — BASIC METABOLIC PANEL
BUN/Creatinine Ratio: 18 (calc) (ref 6–22)
BUN: 20 mg/dL (ref 7–25)
CO2: 25 mmol/L (ref 20–32)
Calcium: 10 mg/dL (ref 8.6–10.4)
Chloride: 99 mmol/L (ref 98–110)
Creat: 1.12 mg/dL — ABNORMAL HIGH (ref 0.60–0.93)
Glucose, Bld: 125 mg/dL — ABNORMAL HIGH (ref 65–99)
Potassium: 4.7 mmol/L (ref 3.5–5.3)
Sodium: 139 mmol/L (ref 135–146)

## 2019-01-27 LAB — SEDIMENTATION RATE: Sed Rate: 28 mm/h (ref 0–30)

## 2019-01-27 LAB — C-REACTIVE PROTEIN: CRP: 20 mg/L — ABNORMAL HIGH (ref <8.0)

## 2019-01-27 NOTE — Patient Outreach (Signed)
Second outreachl to provider requesting consideration of a PC and or Hospice. Left message on nurse triage line. First outreach was by [employer] in which I did not receive a response from Dr. [name].  [name]. Myrtie Neither, MSN, [facility] Gerontological Nurse Practitioner [facility] Care Management [phone]

## 2019-01-31 DIAGNOSIS — L97409 Non-pressure chronic ulcer of unspecified heel and midfoot with unspecified severity: Secondary | ICD-10-CM | POA: Diagnosis not present

## 2019-02-02 DIAGNOSIS — R339 Retention of urine, unspecified: Secondary | ICD-10-CM | POA: Diagnosis not present

## 2019-02-03 ENCOUNTER — Other Ambulatory Visit: Payer: Self-pay | Admitting: *Deleted

## 2019-02-03 ENCOUNTER — Encounter: Payer: Self-pay | Admitting: *Deleted

## 2019-02-03 DIAGNOSIS — H547 Unspecified visual loss: Secondary | ICD-10-CM | POA: Insufficient documentation

## 2019-02-03 DIAGNOSIS — M47816 Spondylosis without myelopathy or radiculopathy, lumbar region: Secondary | ICD-10-CM | POA: Diagnosis not present

## 2019-02-03 DIAGNOSIS — F329 Major depressive disorder, single episode, unspecified: Secondary | ICD-10-CM | POA: Diagnosis not present

## 2019-02-03 DIAGNOSIS — Z978 Presence of other specified devices: Secondary | ICD-10-CM | POA: Insufficient documentation

## 2019-02-03 DIAGNOSIS — M4712 Other spondylosis with myelopathy, cervical region: Secondary | ICD-10-CM | POA: Diagnosis not present

## 2019-02-03 DIAGNOSIS — G894 Chronic pain syndrome: Secondary | ICD-10-CM | POA: Diagnosis not present

## 2019-02-03 DIAGNOSIS — T884XXA Failed or difficult intubation, initial encounter: Secondary | ICD-10-CM | POA: Insufficient documentation

## 2019-02-03 DIAGNOSIS — K219 Gastro-esophageal reflux disease without esophagitis: Secondary | ICD-10-CM | POA: Insufficient documentation

## 2019-02-04 NOTE — Patient Outreach (Signed)
Third attempt to suggest [facility] consult for this pt. Reception advised that pt was seen and MD put in notes that PC is appropriate. Requested that MD put in order for this. His nurse said she would advise.  [name]. Myrtie Neither, MSN, [facility] Gerontological Nurse Practitioner [facility] Care Management [phone]

## 2019-02-09 DIAGNOSIS — E119 Type 2 diabetes mellitus without complications: Secondary | ICD-10-CM | POA: Diagnosis not present

## 2019-02-09 DIAGNOSIS — I10 Essential (primary) hypertension: Secondary | ICD-10-CM | POA: Diagnosis not present

## 2019-02-10 ENCOUNTER — Encounter

## 2019-02-10 ENCOUNTER — Encounter: Payer: Self-pay | Admitting: Cardiology

## 2019-02-10 ENCOUNTER — Other Ambulatory Visit: Payer: Self-pay

## 2019-02-10 ENCOUNTER — Ambulatory Visit: Payer: PPO | Admitting: Cardiology

## 2019-02-10 VITALS — BP 100/56 | HR 90 | Temp 96.8°F | Ht 63.0 in | Wt 180.0 lb

## 2019-02-10 DIAGNOSIS — I5189 Other ill-defined heart diseases: Secondary | ICD-10-CM

## 2019-02-10 DIAGNOSIS — I10 Essential (primary) hypertension: Secondary | ICD-10-CM

## 2019-02-10 NOTE — Patient Instructions (Signed)
Medication Instructions:  NO CHANGES    Lab Work: NOT NEEDED   Testing/Procedures: NOT NEEDED  Follow-Up: At [facility], you and your health needs are our priority.  As part of our continuing mission to provide you with exceptional heart care, we have created designated Provider Care Teams.  These Care Teams include your primary Cardiologist (physician) and Advanced Practice Providers (APPs -  Physician Assistants and Nurse Practitioners) who all work together to provide you with the care you need, when you need it.  Your next appointment:   AS NEEDED  The format for your next appointment:   Either In Person or Virtual  Provider:   Glenetta Hew, MD  Other Instructions

## 2019-02-10 NOTE — Progress Notes (Signed)
Primary Care Provider: [name], MD Cardiologist: Glenetta Hew, MD Electrophysiologist:   Clinic Note: Chief Complaint  Patient presents with   Hospitalization Follow-up    ? diastolic HF    HPI:    [name] is a 68 y.o. female with a PMH notable for paraplegia due to cervical spine injury during surgery in 123456 (complicated last recurrent sacral sacral decubitus ulcers), hypertension, hyperlipidemia, and DM-2 below who presents today for delayed hospital follow-up for "diastolic heart failure ".  Recent Hospitalizations:   June 10-18,2020: Admitted for lethargy and fever, found to have UTI/sepsis with Klebsiella bacteremia.  Was also noted to have possible osteomyelitis from chronic sacral decubitus ulcer.  During her hospitalization she was aggressively hydrated for sepsis and then developed acute hypoxic hypercapnic respiratory failure requiring BiPAP.  (This was thought to be potentially related to narcotic sedation).  She was diuresed aggressively and cardiology was consulted along with PCCM.  [name] was last seen in inpatient consultation for ? CHF when hospitalized back in June 2020.  Echo with Gr 1 DD.  Had been aggressively diuresed after initial volume resuscitation for sepsis. -->  Was felt to be simply volume overloaded secondary to aggressive hydration sepsis.  Did not think this was true diastolic heart failure. --> Simply recommended restarting home dose of oral Lasix along with beta-blocker ARB and nifedipine..  Reviewed  CV studies:    The following studies were reviewed today: (if available, images/films reviewed: From Epic Chart or Care Everywhere)  ECHO 09/2018: Normal LV size and function.  EF 50 to 55%.  GR 1 DD (normal for age).  Normal RV size.  Mild aortic valve thickening/calcification but no stenosis.   Interval History:   [name] returns here today accompanied by her husband.  She is chronically in a wheelchair.  Unable to  transfer alone.  Currently not strong enough to propel herself with a wheelchair, but usually is.  She is no longer using home oxygen, has not noted any significant dyspnea.  She denies any PND or orthopnea, but does have to sleep somewhat elevated because of GERD issues.  She has chronic edema from being paraplegic, but this is no change.  She denies any recurrent chest pain or pressure or worsening dyspnea.  She does not really do much activity mostly because of paraplegia and therefore is difficult to assess any exertional symptoms.  She denies any rapid irregular heartbeats palpitations.  No syncope or near syncope.  No TIA or amaurosis fugax.  She is chronically fatigued, is on the wheelchair all the time and has chronic issues with sacral decubitus ulcers.  The patient does not have symptoms concerning for COVID-19 infection (fever, chills, cough, or new shortness of breath).  The patient is practicing social distancing. If she ever goes out she does wear mask -+  Masking.    REVIEWED OF SYSTEMS   ROS: A comprehensive was performed. Review of Systems  Constitutional: Positive for malaise/fatigue (Chronic, baseline.  Gradually building back strength from her recent hospitalizations).  Respiratory: Negative for shortness of breath and wheezing.   Cardiovascular: Positive for leg swelling (Chronic).  Gastrointestinal: Negative for blood in stool.  Genitourinary: Negative for hematuria.  Musculoskeletal:       Chronic sacral decubitus ulcers, but lacks pain because of paraplegia  Neurological: Negative for dizziness and headaches.  Psychiatric/Behavioral: Negative for memory loss. The patient is not nervous/anxious and does not have insomnia.    I have reviewed and (if needed) personally  updated the patient's problem list, medications, allergies, past medical and surgical history, social and family history.   PAST MEDICAL HISTORY   Past Medical History:  Diagnosis Date   Anemia     Arthritis    "all over" (04/13/2012)   Cancer of right breast ([place]) 2011   Cervical neuropathy    PERIPHERAL NEUROPATHY , HAD CERVICAL FUSION   Diabetes mellitus without complication ([place])    Difficult intubation    Fastrach #4 LMA then # 7 ETT used in 2006 [facility], cervical laminectomy)   GERD (gastroesophageal reflux disease)    History of kidney stones    Hypercholesteremia    Hypertension    Hypothyroidism    Kidney stones    Neurogenic bladder    Osteomyelitis (HCC)    Paraplegia (HCC)    Peripheral neuropathy    PONV (postoperative nausea and vomiting)    Sacral decubitus ulcer 10/2014   Sepsis due to urinary tract infection ([place])      PAST SURGICAL HISTORY   Past Surgical History:  Procedure Laterality Date   ABDOMINAL HYSTERECTOMY  ~ [place] Right 2011   BREAST LUMPECTOMY Right 2011   CALDWELL LUC  ~ 2003   "benign tumor removed from up under gum" (04/13/2012)   DACROCYSTORHINOSTOMY  ~ 2000   "put stents in my tear ducts; both eyes" (04/13/2012)   EYE SURGERY  2004   STENTS TO BIL EYES   I&D EXTREMITY Bilateral 11/03/2014   Procedure: IRRIGATION AND DEBRIDEMENT BILATERAL HEEL;  Surgeon: Irene Limbo, MD;  Location: [facility];  Service: Plastics;  Laterality: Bilateral;   INCISION AND DRAINAGE OF WOUND Left 11/03/2014   Procedure: IRRIGATION AND DEBRIDEMENT SACRAL ULCER;  Surgeon: Irene Limbo, MD;  Location: [facility];  Service: Plastics;  Laterality: Left;   JOINT REPLACEMENT     POSTERIOR CERVICAL LAMINECTOMY  2006   SKIN SPLIT GRAFT Left 08/05/2016   Procedure: SURGICAL PREP  FOR GRAFTING APPLICATION ACELL TO LEFT ISCHIUM;  Surgeon: Irene Limbo, MD;  Location: [facility];  Service: Plastics;  Laterality: Left;   TONSILLECTOMY AND ADENOIDECTOMY  ~ 1954   TOTAL KNEE ARTHROPLASTY WITH REVISION COMPONENTS  04/13/2012   Procedure: TOTAL KNEE ARTHROPLASTY WITH REVISION COMPONENTS;  Surgeon: Hessie Dibble, MD;   Location: [facility];  Service: Orthopedics;  Laterality: Left;     MEDICATIONS/ALLERGIES   Current Meds  Medication Sig   amoxicillin-clavulanate (AUGMENTIN) 500-125 MG tablet Take 1 tablet (500 mg total) by mouth 2 (two) times daily.   ascorbic acid (VITAMIN C) 500 MG tablet Take 1 tablet (500 mg total) by mouth daily.   baclofen (LIORESAL) 20 MG tablet Take 20 mg by mouth every 6 (six) hours as needed for muscle spasms.    bisacodyl (DULCOLAX) 10 MG suppository Place 1 suppository (10 mg total) rectally daily as needed for severe constipation.   docusate sodium (COLACE) 100 MG capsule Take 100 mg by mouth 2 (two) times daily as needed (FOR CONSTIPATION).    DULoxetine (CYMBALTA) 60 MG capsule Take 120 mg by mouth daily.    ferrous sulfate 325 (65 FE) MG tablet Take 325 mg by mouth daily after supper.    folic acid (FOLVITE) 1 MG tablet Take 1 mg by mouth daily.    furosemide (LASIX) 40 MG tablet 80mg  in AM and 40mg  in PM till 09/26/18, then 80mg  in AM from 09/27/18 till 10/03/18, then 40mg  in AM from 10/04/18 onwards  levothyroxine (SYNTHROID, LEVOTHROID) 125 MCG tablet Take 1 tablet (125 mcg total) by mouth daily before breakfast. (Patient taking differently: Take 125 mcg by mouth daily. )   metFORMIN (GLUCOPHAGE) 500 MG tablet Take 250 mg by mouth 2 (two) times daily with a meal.   metoprolol (LOPRESSOR) 100 MG tablet Take 100 mg by mouth 2 (two) times daily.    mometasone-formoterol (DULERA) 100-5 MCG/ACT AERO Inhale 2 puffs into the lungs 2 (two) times daily.   Multiple Vitamin (MULTIVITAMIN WITH MINERALS) TABS tablet Take 1 tablet by mouth daily.   NIFEdipine (PROCARDIA XL/ADALAT-CC) 30 MG 24 hr tablet Take 30 mg by mouth daily after supper.    oxyCODONE-acetaminophen (PERCOCET) 10-325 MG tablet Take 1 tablet by mouth every 6 (six) hours as needed for pain.    polyethylene glycol (MIRALAX / GLYCOLAX) 17 g packet Take 17 g by mouth 2 (two) times daily.   pregabalin (LYRICA)  75 MG capsule Take 75 mg by mouth 2 (two) times daily.    senna-docusate (SENOKOT-S) 8.6-50 MG tablet Take 1 tablet by mouth 2 (two) times daily.   zinc sulfate 220 MG capsule Take 1 capsule (220 mg total) by mouth daily.    Allergies  Allergen Reactions   Ivp Dye [Iodinated Diagnostic Agents] Other (See Comments)    "hot and sweaty and almost passed out"   Aspirin Hives   Sulfa Antibiotics Hives    SOCIAL HISTORY/FAMILY HISTORY   Social History   Tobacco Use   Smoking status: Former Smoker    Packs/day: 0.50    Years: 50.00    Pack years: 25.00    Types: Cigarettes    Quit date: 11/06/2014    Years since quitting: 4.2   Smokeless tobacco: Never Used  Substance Use Topics   Alcohol use: No   Drug use: No   Social History   Social History Narrative   Not on file    family history includes Heart disease in her father.   OBJCTIVE -PE, EKG, labs   Wt Readings from Last 3 Encounters:  02/10/19 180 lb (81.6 kg)  12/02/18 195 lb (88.5 kg)  09/25/18 180 lb (81.6 kg)    Physical Exam: BP (!) 100/56    Pulse 90    Temp (!) 96.8 F (36 C)    Ht 5\' 3"  (1.6 m)    Wt 180 lb (81.6 kg)    SpO2 99%    BMI 31.89 kg/m  Physical Exam  Constitutional: She is oriented to person, place, and time. She appears well-developed and well-nourished. No distress.  Somewhat chronically ill-appearing woman.  Paraplegic.  In wheelchair.  HENT:  Head: Normocephalic and atraumatic.  Neck: Normal range of motion. Neck supple. No hepatojugular reflux and no JVD present. Carotid bruit is not present.  Cardiovascular: Normal rate, regular rhythm, S1 normal, S2 normal and normal pulses.  No extrasystoles are present. PMI is not displaced. Exam reveals distant heart sounds. Exam reveals no gallop and no friction rub.  Murmur (Soft 1/6 SEM at RUSB) heard. Pulmonary/Chest: Effort normal and breath sounds normal. No respiratory distress. She has no wheezes. She has no rales.  Abdominal: Soft.  Bowel sounds are normal. She exhibits no distension. There is no abdominal tenderness.  Musculoskeletal: Normal range of motion.        General: Edema (1+ bilateral LE) present.  Neurological: She is alert and oriented to person, place, and time.  Psychiatric: She has a normal mood and affect. Her behavior is normal. Judgment  and thought content normal.  Vitals reviewed.    Adult ECG Report  Rate: 90 ;  Rhythm: normal sinus rhythm and LVH, LAE.  Nonspecific ST and T wave changes.;   Narrative Interpretation: Relatively stable  Recent Labs:   Lab Results  Component Value Date   CREATININE 1.12 (H) 01/26/2019   BUN 20 01/26/2019   NA 139 01/26/2019   K 4.7 01/26/2019   CL 99 01/26/2019   CO2 25 01/26/2019   Lab Results  Component Value Date   WBC 5.0 12/02/2018   HGB 13.4 12/02/2018   HCT 42.2 12/02/2018   MCV 98.1 12/02/2018   PLT 263 12/02/2018    ASSESSMENT/PLAN    Problem List Items Addressed This Visit    HTN (hypertension) (Chronic)   Relevant Orders   EKG 12-Lead    Other Visit Diagnoses    Diastolic dysfunction    -  Primary   Relevant Orders   EKG 12-Lead      Probably not true diastolic heart failure.  She is on a standing dose of 40 mg Lasix at home and edema is pretty well controlled.  She does not have any PND orthopnea and I suspect that this episode in the hospital is related to volume overload from treatment of sepsis.  She is on a good dose of Lopressor and nifedipine, but is not currently on ARB because her blood pressure is low.  At this point I would not make any further changes.  I think that she truly does not have diastolic heart failure.  A1 diastolic function is normal for age with 68 year old woman with hypertension.  In the absence of PND and orthopnea, I do not think that mild edema is related to heart failure.  She can follow-up with her PCP and here as needed.  --  COVID-19 Education: The signs and symptoms of COVID-19 were discussed  with the patient and how to seek care for testing (follow up with PCP or arrange E-visit).   The importance of social distancing was discussed today.  I spent a total of 10minutes with the patient and chart review. >  50% of the time was spent in direct patient consultation.  Additional time spent with chart review (studies, outside notes, etc): 10 Total Time: 28 min  Current medicines are reviewed at length with the patient today.  (+/- concerns) none   Patient Instructions / Medication Changes & Studies & Tests Ordered   Patient Instructions  Medication Instructions:  NO CHANGES    Lab Work: NOT NEEDED   Testing/Procedures: NOT NEEDED  Follow-Up: At [facility], you and your health needs are our priority.  As part of our continuing mission to provide you with exceptional heart care, we have created designated Provider Care Teams.  These Care Teams include your primary Cardiologist (physician) and Advanced Practice Providers (APPs -  Physician Assistants and Nurse Practitioners) who all work together to provide you with the care you need, when you need it.  Your next appointment:   AS NEEDED  The format for your next appointment:   Either In Person or Virtual  Provider:   Glenetta Hew, MD  Other Instructions    Studies Ordered:   Orders Placed This Encounter  Procedures   EKG 12-Lead     Glenetta Hew, M.D., M.S. Interventional Cardiologist   Pager # [phone] Phone # [phone] [address]. [place], [place] [address]   Thank you for choosing Heartcare at [facility]!!

## 2019-02-13 ENCOUNTER — Encounter: Payer: Self-pay | Admitting: Cardiology

## 2019-02-17 ENCOUNTER — Other Ambulatory Visit: Payer: Self-pay

## 2019-02-17 ENCOUNTER — Encounter (HOSPITAL_BASED_OUTPATIENT_CLINIC_OR_DEPARTMENT_OTHER): Payer: PPO | Attending: Internal Medicine | Admitting: Internal Medicine

## 2019-02-17 DIAGNOSIS — I10 Essential (primary) hypertension: Secondary | ICD-10-CM | POA: Diagnosis not present

## 2019-02-17 DIAGNOSIS — M8660 Other chronic osteomyelitis, unspecified site: Secondary | ICD-10-CM | POA: Diagnosis not present

## 2019-02-17 DIAGNOSIS — L89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, stage 4: Secondary | ICD-10-CM | POA: Diagnosis not present

## 2019-02-17 DIAGNOSIS — G8220 Paraplegia, unspecified: Secondary | ICD-10-CM | POA: Insufficient documentation

## 2019-02-17 DIAGNOSIS — M86652 Other chronic osteomyelitis, left thigh: Secondary | ICD-10-CM | POA: Diagnosis not present

## 2019-02-17 DIAGNOSIS — E11622 Type 2 diabetes mellitus with other skin ulcer: Secondary | ICD-10-CM | POA: Insufficient documentation

## 2019-02-17 NOTE — Progress Notes (Addendum)
[name], [name] ([id number]) Visit Report for 02/17/2019 HPI Details Patient Name: Date of Service: [name], [name] 02/17/2019 11:00 AM Medical Record Number:[id number] Patient Account Number: [id number] Date of Birth/Sex: Treating RN: 1950/06/28 (68 y.o. [name] Primary Care Provider: [name] Other Clinician: Referring Provider: Treating Provider/Extender:Briella Hobday, [name], [name] in Treatment: 7 History of Present Illness Location: open ulceration of the left gluteal area, left heel and right ankle for about 5 months. Quality: Patient reports No Pain. Severity: Patient states wound(s) are getting worse. Duration: Patient has had the wound for > 5 months prior to seeking treatment at the wound center Context: The wound occurred when the patient has been paraplegic for about 3 years. Modifying Factors: Wound improving due to current treatment. Associated Signs and Symptoms: Patient reports having foul odor. HPI Description: this 68 year old patient who is known to have hypertension, hypothyroidism, breast cancer, chronic pain syndrome, paraplegia was noted to have a left gluteal decubitus ulcer and was brought into the hospital. During the course of her hospitalization she was debrided in the operating room by Dr. Leland Johns and had all the wounds sharply debrided. The debridement was done for the left ischial wound, the left heel wound and the right ankle wound. Bone cultures were taken at that time but were negative but clinically she was treated for osteomyelitis because of the probing down to bone and open exposed bone. Home health has been giving her antibioticss which include vancomycin and Zosyn. The patient was a smoker until about 3 weeks ago and used to smoke about 10 cigarettes a day for a long while. 12/13/2014 - details of her operative note from 11/03/2014 were reviewed -- PROCEDURE: 1. Excisional debridement skin, subcutaneous, muscle left ischium 35 cm2 2.  Excisional debridement skin, subcutaneous tissue left heel 27 cm2 3. Excisional debridement right ankle skin, subcutaneous, bone 30 cm2 01/24/2015 -- she has some issues with her wheelchair cushion but other than that is doing very well and has received Podus boots for her feet. 02/14/2015 -- she was using her old offloading boots and this seemed to have caused her a new pressure ulcer on the left posterior heel near the superior part just below the Achilles tendon. 03/07/2015 -- she has a new ulceration just to the left of the midline on her sacral region more on the left buttock and this has been there for about a week. 08/22/2015 -- was recently admitted to hospital between May 5 and 08/13/2015, with sepsis and leukocytosis due to a UTI. she was treated for a sepsis complicating Escherichia coli UTI and kidney stones. She also had metabolic and careful up at the secondary to pyelonephritis. He received broad-spectrum antibiotics initially and then received Macrobid as per urology. She was sent home on nitrofurantoin. during her admission she had a CT scan which showed exposed left ischial tuberosity without evidence of osteolysis. 09/12/2015-- the patient is having some issues with her air mattress and would like to get a opinion from medical modalities. 10/10/2015 -- the issue with her air mattress has not yet been sorted out and the new problem seems to be a lot of odor from the wound VAC. 11/27/2015 -- the patient was admitted to the hospital between July 23 and 10/31/2015. Her problems were sepsis, osteomyelitis of the pelvic bone and acute pyelonephritis. CT of the abdomen and pelvis was consistent with a left- sided pyelonephritis with hydronephrosis and also just showed new sclerosis of the posterior portion of the left anterior pubic ramus suggestive of  periosteal reaction consistent with osteomyelitis. She was treated for the osteomyelitis with infectious disease consult recommending  6 weeks of IV antibiotics including vancomycin and Rocephin and the antibiotics were to go on until 12/10/2015. He was seen by Dr. Iran Planas plastic surgery and Dr. Linus Salmons of infectious disease. She had a suprapubic catheter placed during the admission. CT scan done on 10/28/2015 showed specifically -- New sclerosis of the posterior portion of the left inferior pubic ramus with aggressive periosteal reaction, consistent with osteomyelitis, with adjacent soft tissue gas compatible with previously described decubitus ulcer. 12/12/2015 -- she was recently seen by Dr. Linus Salmons, who noted good improvement and CRP and ESR compared to before and he has stopped her antibiotic as per plans to finish on September 4. The patient was encouraged to continue with wound care and consider hyperbaric oxygen therapy. Today she tells me that she has consented to undergo hyperbaric oxygen therapy and we can start the paperwork. 01/02/2016 -- her PCP had gained about 3 years but she still persists in having problems during hyperbaric oxygen therapy with some discomfort in the ears. 01/09/16; pressure area with underlying osteomyelitis in the left buttock. Wound bed itself has some slight amount of grayish surface slough however I do not think any debridement was necessary. There is no exposed bone soft tissue appears stable. She is using a wound VAC 01/16/16; back for weekly wound review in conjunction with HBO. She has a deep wound over the left initial tuberosity previously treated with 6 weeks of IV antibiotics for osteomyelitis. Wound bed looks reasonably healthy although the base of this is still precariously close to bone. She has been using a wound VAC. 01/23/2016 -- she has completed her course of antibiotics and this week the only new thing is her right great toe nail was avulsed and she has got an open wound over the nailbed. 01/31/16 she has completed her course of antibiotics. Her right great toenail avulsed  last week and she's been using silver alginate for this as well. Still using a wound VAC to the substantial stage IV wound over the left ischial tuberosity 03/05/2016 -- the patient has had a opinion from the plastic surgery group at [facility] and details of this are not available yet but the patient's verbal report has been heard by me. Did not sound like there was any optimistic discussion regarding reconstruction and the net result would be to continue with the wound VAC application. I will await the official reports. Addendum: -- she was seen at [facility] by Dr. Tressa Busman. After a thorough review and from what I understand spending 45 minutes with the patient his assessment has been noted by me in detail and the management options were: 1. Continued pressure offloading and wound care versus operative procedures including wound excision 2. Soft tissue and bone sampling 3. If the wound gets larger wound closure would be done using a variety of plastic surgical techniques including but not limited to skin substitute, possible skin graft, local versus regional flaps, negative pressure dressing application. 4. He discussed with her details of flap surgery and the risks associated 5. He made a comment that since the patient was operated on by Dr. Leland Johns of [facility] plastic surgery unit in [place] the patient may continue to follow-up there for further evaluation for surgical flap closure in the future. 03/19/2016 -- the patient continues to be rather depressed and frustrated with her lack of rapid progress in  healing this wound especially because she thought after hyperbaric oxygen therapy the wound would heal extremely fast. She now understands that was not the implied benefit on wound care which was the recommendation for hyperbaric oxygen therapy. I have had a lengthy discussion with the patient and her husband regarding her  options: 1. Continue with collagen and wound VAC for the primary dressing and offloading and all supportive care. 2. See Dr. Iran Planas for possible placement of Acell or Integra in the OR. 3. get a second opinion from a wound care center and surrounding regions/counties 05/07/2016 -- Note from Dr. Celedonio Miyamoto, who noted that the patient has declined flap surgery. She has discussed application of A cell, and try a few applications to see how the wound progresses. She is also recommended that we could apply products here in the wound center, like Oasis. during her preop workup it was found that her hemoglobin A1c was 11% and she has now been diagnosed as having diabetes mellitus and has been put on appropriate treatment by her PCP 05/28/2016 -- tells me her blood sugars have been doing well and she has an appointment to see her PCP in the next couple of weeks to check her hemoglobin A1c. Other than that she continues to do well. 06/25/2016 -- have not seen her back for the last month but she says her health has been about the same and she has an appointment to check the A1c next week 09/10/16 ---- was seen by Dr. Celedonio Miyamoto -- who applied Acell and saw her back in follow-up. She has recommended silver alginate to the wound every other day and cover with foam. If no significant drainage could transition to collagen every other day. She recommended discontinuing wound VAC. There were no plans to repeat application of Acell. The patient expressed that her husband could do the wound care as going to the Wound Ctr., would cost several $100 for each visit. 10/21/2016 -- her insurance company is getting her new mattress and she is pleased about that. Other than that she has been doing dressings with PolyMem Silver and has been doing very well 02/18/2017 -- she has gone through several changes of her mattress and has not been pleased with any of them. The ventricles are still working on trying to  fit her with the appropriate low air-loss mattress. She has a new wound on the gluteal area which is clearly separated from the original wound. 03/25/17-she is here in follow-up evaluation for her left ischialpressure ulcer. She remains unsatisfied with her pressure mattress. She admits to sitting multiple hours a day, in the bed. We have discussed offloading options. The wound does not appear infected. Nutrition does not appear to be a concern. Will follow-up in 4 weeks, if wound continues to be stalled may consider x-ray to evaluate for refractory osteomyelitis. 04/21/17; this is a patient that I don't know all that well. She has a chronic wound which at one point had underlying osteomyelitis in the left ischial tuberosity. This is a stage IV pressure ulcer. Over the last 3 months she has a stage II wound inferiorly to the original wound. The last time she was here her dressing was changed to silver collagen although the patient's husband who changes the dressing said that the collagen stuck to the wound and remove skin from the superficial area therefore he switched back to [place] 05/13/17; this is a patient we've been following for a left ischial tuberosity wound which was stage IV  at one point had underlying osteomyelitis. Over the last several months she's had a stage II wound just inferior and medial to the related to the wound. According to her husband he is using Endoform layer with collagen although this is not what I had last time. According to her husband they are using Elgie Congo with collagen although I don't quite know how that started. She was hospitalized from 1/20 through 04/30/16. This was related to a UTI. Her blood cultures were negative, urine culture showed multiple species. She did have a CT scan of the abdomen and pelvis which documented chronic osteomyelitis in the area of the wound inflammatory markers were unremarkable. She has had prior knowledge of osteomyelitis.  It looks as though she received IV antibiotics in 2017 and was treated with a course of hyperbaric oxygen. 05/28/17; the wound over the left ischial tuberosity is deeper today and abuts clearly on bone. Nursing intake reported drainage. I therefore culture of the wound. The more superficial area just below this looks about the same. They once again complained that there are mattress cover is not working although apparently [facility] is been noted to see this many times in the report is that the device is functional 06/18/17; the patient had a probing area on the left ischial tuberosity that was draining purulent fluid last time. This also clearly seemed to have open bone. Culture I did showed pansensitive pseudomonas including third generation cephalosporins. I treated this with cefdinir 300 twice a day for 10 days and things seem to have improved. She has a more superficial wound just underneath this area. Amazingly she has a new air mattress through [facility]. I think they gave this to her as a parking give. In any case this now works according to the patient may have something to do with why the areas are looking better. 07/09/17; the patient has a probing area in the left ischial tuberosity that still has some depth. However this is contracted in terms of the wound orifice although the depth is still roughly the same. There is no undermining. She also has the satellite wound which is more superficial. This appears to have a healthy surface we've been using silver collagen 08/06/17; the patient's wound is over the left ischial tuberosity and a satellite lesion just underneath this. The original wound was actually a deep stage 4 wound. We have made good progress in 2 months and there is no longer exposed bone here. 09/03/17; left ischial tuberosity actually appears to be quite healthy. I think we are making progress. No debridement is required. There is no surrounding  erythema 10/01/17 I follow this patient monthly for her left ischial tuberosity wound. There is 2 areas the original area and a satellite area. The satellite area looks a lot better there is no surrounding erythema. Her husband relates that he is having trouble maintaining the dressing. This has to do with the soft tissue around it. He states he puts the collagen in but he cannot make sure that it stays in even with the ABD pads and tape that he is been using 10/29/17; patient arrives with a better looking noon today. Some of the satellite lesions have closed. using Prisma 11/26/17; the patient has a large cone-shaped area with the tip of the Cone deep within her buttock soft tissue. The walls of the Cone are epithelialized however the base is still open. The area at the base of this looks moist we've been using silver collagen. Will  change to silver alginate 12/31/2017; the wound appears to have come in fairly nicely. Using silver alginate. There is no surrounding maceration or infection 01/28/18; there is still an open area here over the left initial tuberosity. Base of this however looks healthy. There is no surrounding infection 02/25/18; the area of its open is over the left ischial tuberosity. The base of this is where the wound is. This is a large inverted cone-shaped area with the wound at the tip. Dimensions of the wound at the tip are improved. There is a area of denuded skin about halfway towards the tip which her husband thinks may have happened today when he was bathing her. 04/20/17; the area is still open over the left initial tuberosity. 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 This is now completely closed over although it is closed over with a divot and skin over bone at the base of this.  Continued aggressive offloading will be necessary. 12/30/2018 READMISSION This is a 68 year old woman with chronic paraplegia. I picked her up for her care from Dr. Con Memos in this clinic after he departed. She had a stage IV pressure wound over the left ischial tuberosity. She was treated twice for her underlying osteomyelitis and this I believe firstly in 2016 and again in 2017. There were some plans at some point for flap closure of this however she was discovered to have uncontrolled diabetes and I do not think this was ever accomplished. She ultimately healed over in this clinic and was discharged in May. She has a large cone-shaped indentation with the tip of this going towards the left ischial tuberosity. It is not an easy area to examine but at that time I thought all of this was epithelialized. Apparently there was a reopening here shortly after she left the clinic last time. She was admitted to hospital at the end of June for Klebsiella bacteremia felt to be secondary to UTI. A CT scan of the pelvis is listed below and there was initially some concern that she had underlying osteomyelitis although I believe she was seen by infectious disease and that was felt to be not the case: I do not see any new cultures or inflammatory markers IMPRESSION: 1. No CT evidence for acute intra-abdominal or pelvic abnormality. Large volume of stool throughout the colon. 2. Enlarged fatty liver with fat sparing near the gallbladder fossa 3. Cortical scarring right kidney. Bilateral intrarenal stones without hydronephrosis. Thick-walled urinary bladder decompressed by suprapubic catheter 4. Deep left decubitus ulcer with underlying left ischial changes suggesting osteomyelitis. Her husband has been using silver collagen in the wound. She has not been systemically unwell no fever chills eating and drinking well. They rigorously offload this wound only getting up in the wheelchair when she is going  to appointments the rest of the time she is in bed. 10/8; wound measures larger and she now has exposed bone. We have been using silver alginate 11/12 still using silver alginate. The patient saw Dr. Megan Salon of infectious disease. She was started on Augmentin 500 mg twice daily. She is due to follow-up with Dr. Megan Salon I believe next week. Lab work Dr. Megan Salon requested showed a sedimentation rate of 28 and CRP of 20 although her CRP 1 year ago was 18.8. Sedimentation rate 1 year ago  was 11 basic metabolic panel showed a creatinine of 1.12 Electronic Signature(s) Signed: 02/18/2019 9:11:40 AM By: Maye Hides RCP Signed: 02/18/2019 5:33:43 PM By: Linton Ham MD Previous Signature: 02/17/2019 5:47:05 PM Version By: Linton Ham MD Entered By: Maye Hides on 02/18/2019 09:11:40 -------------------------------------------------------------------------------- Physical Exam Details Patient Name: Date of Service: [name], [name] 02/17/2019 11:00 AM Medical Record [id number] Patient Account Number: [id number] Date of Birth/Sex: Treating RN: [birth year] (68 y.o. [name] Primary Care Provider: [name] Other Clinician: Referring Provider: Treating Provider/Extender:[name], [name], [name] in Treatment: 7 Constitutional Patient is hypertensive.. Pulse regular and within target range for patient.Marland Kitchen Respirations regular, non-labored and within target range.. Temperature is normal and within the target range for the patient.Marland Kitchen Appears in no distress. Eyes Conjunctivae clear. No discharge.no icterus. Genitourinary (GU) Suprapubic catheter. Integumentary (Hair, Skin) No erythema around the wound. Psychiatric appears at normal baseline. Notes Wound examleft ischial tuberosity. The wound is in the base of the deep cone-shaped wound from a previous wound in this area. She still has easily palpable bone she is tender in the soft tissue but there is no crepitus  no purulence. Electronic Signature(s) Signed: 02/17/2019 5:47:05 PM By: Linton Ham MD Entered By: Linton Ham on 02/17/2019 12:44:43 -------------------------------------------------------------------------------- Physician Orders Details Patient Name: Date of Service: [name], [name] 02/17/2019 11:00 AM Medical Record [id number] Patient Account Number: [id number] Date of Birth/Sex: Treating RN: 11-Dec-1950 (68 y.o. [name], [name] Primary Care Provider: [name] Other Clinician: Referring Provider: Treating Provider/Extender:Iokepa Geffre, [name], [name] in Treatment: 7 Verbal / Phone Orders: No Diagnosis Coding ICD-10 Coding Code Description [phone] Pressure ulcer of left buttock, stage 4 G82.20 Paraplegia, unspecified E11.622 Type 2 diabetes mellitus with other skin ulcer Follow-up Appointments Return appointment in 3 weeks. - week after thanksgiving. Dressing Change Frequency Change Dressing every other day. Skin Barriers/Peri-Wound Care Skin Prep - to periwound. Barrier cream - as needed for maceration, wetness, or redness to periwound. Wound Cleansing Wound #12 Left Ischial Tuberosity May shower and wash wound with soap and water. - or wound cleanser Primary Wound Dressing Wound #12 Left Ischial Tuberosity Calcium Alginate with Silver - pack into wound bed back with dry gauze. Secondary Dressing Wound #12 Left Ischial Tuberosity ABD pad - or bordered foam. Off-Loading Low air-loss mattress (Group 2) - continue to use Gel wheelchair cushion - continue to use specialty wheelchair cushion. Turn and reposition every 2 hours Additional Orders / Instructions Other: - Ensure to follow up with infectious Disease on 02/24/2019. Electronic Signature(s) Signed: 02/17/2019 5:17:30 PM By: Deon Pilling Signed: 02/17/2019 5:47:05 PM By: Linton Ham MD Entered By: Deon Pilling on 02/17/2019  11:39:46 -------------------------------------------------------------------------------- Problem List Details Patient Name: Date of Service: [name], [name] 02/17/2019 11:00 AM Medical Record [id number] Patient Account Number: [id number] Date of Birth/Sex: Treating RN: 1950/12/24 (68 y.o. [name], [name] Primary Care Provider: [name] Other Clinician: Referring Provider: Treating Provider/Extender:Araiya Tilmon, [name], [name] in Treatment: 7 Active Problems ICD-10 Evaluated Encounter Code Description Active Date Today Diagnosis 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, stage 4 12/30/2018 No Yes G82.20 Paraplegia, unspecified 12/30/2018 No Yes E11.622 Type 2 diabetes mellitus with other skin ulcer 12/30/2018 No Yes M86.68 Other chronic osteomyelitis, other site 02/17/2019 No Yes Inactive Problems Resolved Problems Electronic Signature(s) Signed: 02/17/2019 5:47:05 PM By: Linton Ham MD Entered By: Linton Ham on 02/17/2019 12:31:58 -------------------------------------------------------------------------------- Progress Note Details Patient Name: Date of Service: [name] 02/17/2019 11:00 AM Medical Record [id number] Patient Account Number: [id number] Date of Birth/Sex: Treating RN: 1950/08/23 (  68 y.o. [name], [name] Primary Care Provider: [name] Other Clinician: Referring Provider: Treating Provider/Extender:Donis Pinder, [name], [name] in Treatment: 7 Subjective History of Present Illness (HPI) The following HPI elements were documented for the patient's wound: Location: open ulceration of the left gluteal area, left heel and right ankle for about 5 months. Quality: Patient reports No Pain. Severity: Patient states wound(s) are getting worse. Duration: Patient has had the wound for > 5 months prior to seeking treatment at the wound center Context: The wound occurred when the patient has been paraplegic for about 3 years. Modifying  Factors: Wound improving due to current treatment. Associated Signs and Symptoms: Patient reports having foul odor. this 68 year old patient who is known to have hypertension, hypothyroidism, breast cancer, chronic pain syndrome, paraplegia was noted to have a left gluteal decubitus ulcer and was brought into the hospital. During the course of her hospitalization she was debrided in the operating room by Dr. Leland Johns and had all the wounds sharply debrided. The debridement was done for the left ischial wound, the left heel wound and the right ankle wound. Bone cultures were taken at that time but were negative but clinically she was treated for osteomyelitis because of the probing down to bone and open exposed bone. Home health has been giving her antibioticss which include vancomycin and Zosyn. The patient was a smoker until about 3 weeks ago and used to smoke about 10 cigarettes a day for a long while. 12/13/2014 - details of her operative note from 11/03/2014 were reviewed -- PROCEDURE: 1. Excisional debridement skin, subcutaneous, muscle left ischium 35 cm2 2. Excisional debridement skin, subcutaneous tissue left heel 27 cm2 3. Excisional debridement right ankle skin, subcutaneous, bone 30 cm2 01/24/2015 -- she has some issues with her wheelchair cushion but other than that is doing very well and has received Podus boots for her feet. 02/14/2015 -- she was using her old offloading boots and this seemed to have caused her a new pressure ulcer on the left posterior heel near the superior part just below the Achilles tendon. 03/07/2015 -- she has a new ulceration just to the left of the midline on her sacral region more on the left buttock and this has been there for about a week. 08/22/2015 -- was recently admitted to hospital between May 5 and 08/13/2015, with sepsis and leukocytosis due to a UTI. she was treated for a sepsis complicating Escherichia coli UTI and kidney stones. She also had  metabolic and careful up at the secondary to pyelonephritis. He received broad-spectrum antibiotics initially and then received Macrobid as per urology. She was sent home on nitrofurantoin. during her admission she had a CT scan which showed exposed left ischial tuberosity without evidence of osteolysis. 09/12/2015-- the patient is having some issues with her air mattress and would like to get a opinion from medical modalities. 10/10/2015 -- the issue with her air mattress has not yet been sorted out and the new problem seems to be a lot of odor from the wound VAC. 11/27/2015 -- the patient was admitted to the hospital between July 23 and 10/31/2015. Her problems were sepsis, osteomyelitis of the pelvic bone and acute pyelonephritis. CT of the abdomen and pelvis was consistent with a left- sided pyelonephritis with hydronephrosis and also just showed new sclerosis of the posterior portion of the left anterior pubic ramus suggestive of periosteal reaction consistent with osteomyelitis. She was treated for the osteomyelitis with infectious disease consult recommending 6 weeks of IV antibiotics  including vancomycin and Rocephin and the antibiotics were to go on until 12/10/2015. He was seen by Dr. Iran Planas plastic surgery and Dr. Linus Salmons of infectious disease. She had a suprapubic catheter placed during the admission. CT scan done on 10/28/2015 showed specifically -- New sclerosis of the posterior portion of the left inferior pubic ramus with aggressive periosteal reaction, consistent with osteomyelitis, with adjacent soft tissue gas compatible with previously described decubitus ulcer. 12/12/2015 -- she was recently seen by Dr. Linus Salmons, who noted good improvement and CRP and ESR compared to before and he has stopped her antibiotic as per plans to finish on September 4. The patient was encouraged to continue with wound care and consider hyperbaric oxygen therapy. Today she tells me that she has  consented to undergo hyperbaric oxygen therapy and we can start the paperwork. 01/02/2016 -- her PCP had gained about 3 years but she still persists in having problems during hyperbaric oxygen therapy with some discomfort in the ears. 01/09/16; pressure area with underlying osteomyelitis in the left buttock. Wound bed itself has some slight amount of grayish surface slough however I do not think any debridement was necessary. There is no exposed bone soft tissue appears stable. She is using a wound VAC 01/16/16; back for weekly wound review in conjunction with HBO. She has a deep wound over the left initial tuberosity previously treated with 6 weeks of IV antibiotics for osteomyelitis. Wound bed looks reasonably healthy although the base of this is still precariously close to bone. She has been using a wound VAC. 01/23/2016 -- she has completed her course of antibiotics and this week the only new thing is her right great toe nail was avulsed and she has got an open wound over the nailbed. 01/31/16 she has completed her course of antibiotics. Her right great toenail avulsed last week and she's been using silver alginate for this as well. Still using a wound VAC to the substantial stage IV wound over the left ischial tuberosity 03/05/2016 -- the patient has had a opinion from the plastic surgery group at [facility] and details of this are not available yet but the patient's verbal report has been heard by me. Did not sound like there was any optimistic discussion regarding reconstruction and the net result would be to continue with the wound VAC application. I will await the official reports. Addendum: -- she was seen at [facility] by Dr. Tressa Busman. After a thorough review and from what I understand spending 45 minutes with the patient his assessment has been noted by me in detail and the management options were: 1. Continued pressure offloading and wound  care versus operative procedures including wound excision 2. Soft tissue and bone sampling 3. If the wound gets larger wound closure would be done using a variety of plastic surgical techniques including but not limited to skin substitute, possible skin graft, local versus regional flaps, negative pressure dressing application. 4. He discussed with her details of flap surgery and the risks associated 5. He made a comment that since the patient was operated on by Dr. Leland Johns of [facility] plastic surgery unit in [place] the patient may continue to follow-up there for further evaluation for surgical flap closure in the future. 03/19/2016 -- the patient continues to be rather depressed and frustrated with her lack of rapid progress in healing this wound especially because she thought after hyperbaric oxygen therapy the wound would heal extremely fast. She now understands that  was not the implied benefit on wound care which was the recommendation for hyperbaric oxygen therapy. I have had a lengthy discussion with the patient and her husband regarding her options: 1. Continue with collagen and wound VAC for the primary dressing and offloading and all supportive care. 2. See Dr. Iran Planas for possible placement of Acell or Integra in the OR. 3. get a second opinion from a wound care center and surrounding regions/counties 05/07/2016 -- Note from Dr. Celedonio Miyamoto, who noted that the patient has declined flap surgery. She has discussed application of A cell, and try a few applications to see how the wound progresses. She is also recommended that we could apply products here in the wound center, like Oasis. during her preop workup it was found that her hemoglobin A1c was 11% and she has now been diagnosed as having diabetes mellitus and has been put on appropriate treatment by her PCP 05/28/2016 -- tells me her blood sugars have been doing well and she has an appointment to see her PCP  in the next couple of weeks to check her hemoglobin A1c. Other than that she continues to do well. 06/25/2016 -- have not seen her back for the last month but she says her health has been about the same and she has an appointment to check the A1c next week 09/10/16 ---- was seen by Dr. Celedonio Miyamoto -- who applied Acell and saw her back in follow-up. She has recommended silver alginate to the wound every other day and cover with foam. If no significant drainage could transition to collagen every other day. She recommended discontinuing wound VAC. There were no plans to repeat application of Acell. The patient expressed that her husband could do the wound care as going to the Wound Ctr[facility] would cost several $100 for each visit. 10/21/2016 -- her insurance company is getting her new mattress and she is pleased about that. Other than that she has been doing dressings with PolyMem Silver and has been doing very well 02/18/2017 -- she has gone through several changes of her mattress and has not been pleased with any of them. The ventricles are still working on trying to fit her with the appropriate low air-loss mattress. She has a new wound on the gluteal area which is clearly separated from the original wound. 03/25/17-she is here in follow-up evaluation for her left ischialpressure ulcer. She remains unsatisfied with her pressure mattress. She admits to sitting multiple hours a day, in the bed. We have discussed offloading options. The wound does not appear infected. Nutrition does not appear to be a concern. Will follow-up in 4 weeks, if wound continues to be stalled may consider x-ray to evaluate for refractory osteomyelitis. 04/21/17; this is a patient that I don't know all that well. She has a chronic wound which at one point had underlying osteomyelitis in the left ischial tuberosity. This is a stage IV pressure ulcer. Over the last 3 months she has a stage II wound inferiorly to the original  wound. The last time she was here her dressing was changed to silver collagen although the patient's husband who changes the dressing said that the collagen stuck to the wound and remove skin from the superficial area therefore he switched back to [place] 05/13/17; this is a patient we've been following for a left ischial tuberosity wound which was stage IV at one point had underlying osteomyelitis. Over the last several months she's had a stage II wound just inferior and medial  to the related to the wound. According to her husband he is using Endoform layer with collagen although this is not what I had last time. According to her husband they are using Elgie Congo with collagen although I don't quite know how that started. She was hospitalized from 1/20 through 04/30/16. This was related to a UTI. Her blood cultures were negative, urine culture showed multiple species. She did have a CT scan of the abdomen and pelvis which documented chronic osteomyelitis in the area of the wound inflammatory markers were unremarkable. She has had prior knowledge of osteomyelitis. It looks as though she received IV antibiotics in 2017 and was treated with a course of hyperbaric oxygen. 05/28/17; the wound over the left ischial tuberosity is deeper today and abuts clearly on bone. Nursing intake reported drainage. I therefore culture of the wound. The more superficial area just below this looks about the same. They once again complained that there are mattress cover is not working although apparently [facility] is been noted to see this many times in the report is that the device is functional 06/18/17; the patient had a probing area on the left ischial tuberosity that was draining purulent fluid last time. This also clearly seemed to have open bone. Culture I did showed pansensitive pseudomonas including third generation cephalosporins. I treated this with cefdinir 300 twice a day for 10 days and things  seem to have improved. She has a more superficial wound just underneath this area. Amazingly she has a new air mattress through [facility]. I think they gave this to her as a parking give. In any case this now works according to the patient may have something to do with why the areas are looking better. 07/09/17; the patient has a probing area in the left ischial tuberosity that still has some depth. However this is contracted in terms of the wound orifice although the depth is still roughly the same. There is no undermining. ooShe also has the satellite wound which is more superficial. This appears to have a healthy surface we've been using silver collagen 08/06/17; the patient's wound is over the left ischial tuberosity and a satellite lesion just underneath this. The original wound was actually a deep stage 4 wound. We have made good progress in 2 months and there is no longer exposed bone here. 09/03/17; left ischial tuberosity actually appears to be quite healthy. I think we are making progress. No debridement is required. There is no surrounding erythema 10/01/17 I follow this patient monthly for her left ischial tuberosity wound. There is 2 areas the original area and a satellite area. The satellite area looks a lot better there is no surrounding erythema. Her husband relates that he is having trouble maintaining the dressing. This has to do with the soft tissue around it. He states he puts the collagen in but he cannot make sure that it stays in even with the ABD pads and tape that he is been using 10/29/17; patient arrives with a better looking noon today. Some of the satellite lesions have closed. using Prisma 11/26/17; the patient has a large cone-shaped area with the tip of the Cone deep within her buttock soft tissue. The walls of the Cone are epithelialized however the base is still open. The area at the base of this looks moist we've been using silver collagen. Will change to  silver alginate 12/31/2017; the wound appears to have come in fairly nicely. Using silver alginate. There is no surrounding  maceration or infection 01/28/18; there is still an open area here over the left initial tuberosity. Base of this however looks healthy. There is no surrounding infection 02/25/18; the area of its open is over the left ischial tuberosity. The base of this is where the wound is. This is a large inverted cone-shaped area with the wound at the tip. Dimensions of the wound at the tip are improved. There is a area of denuded skin about halfway towards the tip which her husband thinks may have happened today when he was bathing her. 04/20/17; the area is still open over the left initial tuberosity. 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 This is now completely closed over although it is closed over with a divot and skin over bone at the base of this. Continued aggressive offloading will be necessary. 12/30/2018 READMISSION This is a 68 year old woman with chronic paraplegia. I picked her up for her care from Dr. Con Memos in this clinic after he departed. She had a stage IV pressure wound over the left ischial tuberosity. She was treated twice for her underlying osteomyelitis and this I believe firstly in 2016 and again in 2017. There were some plans at some point for flap closure of this however she was discovered to have uncontrolled diabetes and I do not think this was ever accomplished. She ultimately healed over in this clinic and was discharged in May. She has a large cone-shaped indentation with the tip of this going towards the left ischial tuberosity. It is not an easy area to examine but at that time I thought all of this was epithelialized. Apparently there was a reopening here  shortly after she left the clinic last time. She was admitted to hospital at the end of June for Klebsiella bacteremia felt to be secondary to UTI. A CT scan of the pelvis is listed below and there was initially some concern that she had underlying osteomyelitis although I believe she was seen by infectious disease and that was felt to be not the case: I do not see any new cultures or inflammatory markers IMPRESSION: 1. No CT evidence for acute intra-abdominal or pelvic abnormality. Large volume of stool throughout the colon. 2. Enlarged fatty liver with fat sparing near the gallbladder fossa 3. Cortical scarring right kidney. Bilateral intrarenal stones without hydronephrosis. Thick-walled urinary bladder decompressed by suprapubic catheter 4. Deep left decubitus ulcer with underlying left ischial changes suggesting osteomyelitis. Her husband has been using silver collagen in the wound. She has not been systemically unwell no fever chills eating and drinking well. They rigorously offload this wound only getting up in the wheelchair when she is going to appointments the rest of the time she is in bed. 10/8; wound measures larger and she now has exposed bone. We have been using silver alginate 11/12 still using silver alginate. The patient saw Dr. Megan Salon of infectious disease. She was started on Augmentin 500 mg twice daily. She is due to follow-up with Dr. Megan Salon I believe next week. Lab work Dr. Megan Salon requested showed a sedimentation rate of 28 and CRP of 20 although her CRP 1 year ago was 18.8. Sedimentation rate 1 year ago was 11 basic metabolic panel showed a creatinine of 1.12 Objective Constitutional Patient is hypertensive.. Pulse regular and within target range for  patient.Marland Kitchen Respirations regular, non-labored and within target range.. Temperature is normal and within the target range for the patient.Marland Kitchen Appears in no distress. Vitals Time Taken: 11:10 AM, Height: 63 in, Weight:  185 lbs, BMI: 32.8, Temperature: 98.0 F, Pulse: 92 bpm, Respiratory Rate: 17 breaths/min, Blood Pressure: 152/65 mmHg. Eyes Conjunctivae clear. No discharge.no icterus. Genitourinary (GU) Suprapubic catheter. Psychiatric appears at normal baseline. General Notes: Wound examooleft ischial tuberosity. The wound is in the base of the deep cone-shaped wound from a previous wound in this area. She still has easily palpable bone she is tender in the soft tissue but there is no crepitus no purulence. Integumentary (Hair, Skin) No erythema around the wound. Wound #12 status is Open. Original cause of wound was Pressure Injury. The wound is located on the Left Ischial Tuberosity. The wound measures 2cm length x 1.5cm width x 2.1cm depth; 2.356cm^2 area and 4.948cm^3 volume. There is Fat Layer (Subcutaneous Tissue) Exposed exposed. There is no tunneling or undermining noted. There is a medium amount of serosanguineous drainage noted. The wound margin is well defined and not attached to the wound base. There is large (67-100%) pink granulation within the wound bed. There is a small (1-33%) amount of necrotic tissue within the wound bed including Adherent Slough. Assessment Active Problems ICD-10 Pressure ulcer of left buttock, stage 4 Paraplegia, unspecified Type 2 diabetes mellitus with other skin ulcer Other chronic osteomyelitis, other site Plan Follow-up Appointments: Return appointment in 3 weeks. - week after thanksgiving. Dressing Change Frequency: Change Dressing every other day. Skin Barriers/Peri-Wound Care: Skin Prep - to periwound. Barrier cream - as needed for maceration, wetness, or redness to periwound. Wound Cleansing: Wound #12 Left Ischial Tuberosity: May shower and wash wound with soap and water. - or wound cleanser Primary Wound Dressing: Wound #12 Left Ischial Tuberosity: Calcium Alginate with Silver - pack into wound bed back with dry gauze. Secondary  Dressing: Wound #12 Left Ischial Tuberosity: ABD pad - or bordered foam. Off-Loading: Low air-loss mattress (Group 2) - continue to use Gel wheelchair cushion - continue to use specialty wheelchair cushion. Turn and reposition every 2 hours Additional Orders / Instructions: Other: - Ensure to follow up with infectious Disease on 02/24/2019. 1. Appreciate Dr. Hale Bogus assistance with the osteomyelitis. He is started on Augmentin with follow-up of her inflammatory markers I think next week 2. The wound is quite a bit worse than the last time we dealt with this but now there is considerable depth. After were convinced that the osteomyelitis part of this is been adequately treated with Dr. Hale Bogus assistance I think the route to go here would be a wound VAC although admittedly this is not going to be an easy area to get a seal. Nevertheless I think we should try 3. The patient is in bed all day long except for when she goes out for appointments. She does not spend any time in a wheelchair. I think they carefully offload this as best they can at home. Electronic Signature(s) Signed: 02/21/2019 5:46:17 PM By: Linton Ham MD Signed: 02/21/2019 5:59:11 PM By: Levan Hurst RN, BSN Previous Signature: 02/17/2019 5:47:05 PM Version By: Linton Ham MD Entered By: Levan Hurst on 02/21/2019 14:10:05 -------------------------------------------------------------------------------- SuperBill Details Patient Name: Date of Service: BURKLEY, DECH 02/17/2019 Medical Record Number:[id number] Patient Account Number: [id number] Date of Birth/Sex: Treating RN: March 17, 1951 (68 y.o. [name] Primary Care Provider: [name] Other Clinician: Referring Provider: Treating Provider/Extender:Tajah Schreiner, [name], [name] in Treatment: 7 Diagnosis Coding ICD-10 Codes  Code Description 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, stage 4 G82.20 Paraplegia, unspecified E11.622 Type 2  diabetes mellitus with other skin ulcer M86.68 Other chronic osteomyelitis, other site Facility Procedures The patient participates with Medicare or their insurance follows the Medicare Facility Guidelines: CPT4 Code Description Modifier Quantity [id number] [place] VISIT-LEV 4 EST PT 1 Physician Procedures CPT4 Code: 1364383 Description: 77939 - WC PHYS LEVEL 3 - EST PT ICD-10 Diagnosis Description 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, stage 4 M86.68 Other chronic osteomyelitis, other site G82.20 Paraplegia, unspecified Modifier: Quantity: 1 Electronic Signature(s) Signed: 02/17/2019 5:47:05 PM By: Linton Ham MD Entered By: Linton Ham on 02/17/2019 12:46:35

## 2019-02-24 ENCOUNTER — Encounter: Payer: Self-pay | Admitting: Internal Medicine

## 2019-02-24 ENCOUNTER — Other Ambulatory Visit: Payer: Self-pay

## 2019-02-24 ENCOUNTER — Other Ambulatory Visit: Payer: Self-pay | Admitting: *Deleted

## 2019-02-24 ENCOUNTER — Ambulatory Visit (INDEPENDENT_AMBULATORY_CARE_PROVIDER_SITE_OTHER): Payer: PPO | Admitting: Internal Medicine

## 2019-02-24 DIAGNOSIS — M86652 Other chronic osteomyelitis, left thigh: Secondary | ICD-10-CM | POA: Diagnosis not present

## 2019-02-24 NOTE — Patient Outreach (Signed)
Fourth Telephone outreach to try to contact Dr. [name]. I have left 3 previous messages. I have left a voice mail today requesting an order/referral for palliative care for his pt, [name]. I have also requested a return call for verification that the message was received and action.  [name]. Myrtie Neither, MSN, [facility] Gerontological Nurse Practitioner [facility] Care Management [phone]

## 2019-02-24 NOTE — Assessment & Plan Note (Signed)
She seems to be improving clinically on oral Augmentin for pelvic osteomyelitis complicating her left ischial pressure sore.  She saw Dr. Dellia Nims at the wound center last week.  There was no purulence in the wound but bone was easily palpable at the base of the wound.  He suggested considering wound VAC therapy once we feel her infection is likely to have been cured.  I will continue Augmentin for now, repeat her C-reactive protein and see her back in 2 weeks.

## 2019-02-24 NOTE — Progress Notes (Signed)
[facility] for Infectious Disease  Patient Active Problem List   Diagnosis Date Noted   Left perineal ischial pressure ulcer 09/16/2018    Priority: High   Chronic osteomyelitis of pelvic region, left ([place]) 11/06/2014    Priority: High   Foley catheter in place 02/03/2019   GERD (gastroesophageal reflux disease) 02/03/2019   Hard to intubate 02/03/2019   Poor vision 02/03/2019   Former smoker 01/26/2019   Neuropathy 01/26/2019   Adjustment disorder with disturbance of emotion 12/02/2018   Palliative care by specialist    Advanced care planning/counseling discussion    Spondylosis, cervical, with myelopathy    Diabetes type 2, uncontrolled ([place]) 04/26/2017   Urinary tract infection associated with cystostomy catheter ([place])    Medication monitoring encounter 12/03/2015   Hypertension    History of nephrolithiasis    AKI (acute kidney injury) ([place]) 08/10/2015   Paraplegia ([place]) 11/01/2014   Hypothyroidism 10/31/2014   Thoracic stenosis 08/23/2014   Breast cancer of lower-inner quadrant of right female breast ([place]) 12/22/2012   HTN (hypertension) 04/15/2012   Left knee DJD 04/13/2012    Class: Chronic    Patient's Medications  New Prescriptions   No medications on file  Previous Medications   AMOXICILLIN-CLAVULANATE (AUGMENTIN) 500-125 MG TABLET    Take 1 tablet (500 mg total) by mouth 2 (two) times daily.   ASCORBIC ACID (VITAMIN C) 500 MG TABLET    Take 1 tablet (500 mg total) by mouth daily.   BACLOFEN (LIORESAL) 20 MG TABLET    Take 20 mg by mouth every 6 (six) hours as needed for muscle spasms.    BISACODYL (DULCOLAX) 10 MG SUPPOSITORY    Place 1 suppository (10 mg total) rectally daily as needed for severe constipation.   [employer] SENNA 8.6 MG TABLET    TAKE 2 TABLETS BY MOUTH AT BEDTIME AS NEEDED   DOCUSATE SODIUM (COLACE) 100 MG CAPSULE    Take 100 mg by mouth 2 (two) times daily as needed (FOR CONSTIPATION).    DULOXETINE (CYMBALTA)  60 MG CAPSULE    Take 120 mg by mouth daily.    FERROUS SULFATE 325 (65 FE) MG TABLET    Take 325 mg by mouth daily after supper.    FOLIC ACID (FOLVITE) 1 MG TABLET    Take 1 mg by mouth daily.    FUROSEMIDE (LASIX) 40 MG TABLET    80mg  in AM and 40mg  in PM till 09/26/18, then 80mg  in AM from 09/27/18 till 10/03/18, then 40mg  in AM from 10/04/18 onwards   IRBESARTAN (AVAPRO) 300 MG TABLET    Take 300 mg by mouth daily.   LACTULOSE (CHRONULAC) 10 GM/15ML SOLUTION    TAKE 30 MILLILITERS BY MOUTH FOR 1 DOSE   LEVOTHYROXINE (SYNTHROID, LEVOTHROID) 125 MCG TABLET    Take 1 tablet (125 mcg total) by mouth daily before breakfast.   METFORMIN (GLUCOPHAGE) 1000 MG TABLET    Take 1,000 mg by mouth 2 (two) times daily.   METFORMIN (GLUCOPHAGE) 500 MG TABLET    Take 250 mg by mouth 2 (two) times daily with a meal.   METOPROLOL (LOPRESSOR) 100 MG TABLET    Take 100 mg by mouth 2 (two) times daily.    MOMETASONE-FORMOTEROL (DULERA) 100-5 MCG/ACT AERO    Inhale 2 puffs into the lungs 2 (two) times daily.   MULTIPLE VITAMIN (MULTIVITAMIN WITH MINERALS) TABS TABLET    Take 1 tablet by mouth daily.   NIFEDIPINE (  ADALAT CC) 30 MG 24 HR TABLET    Take 30 mg by mouth daily.   NIFEDIPINE (PROCARDIA XL/ADALAT-CC) 30 MG 24 HR TABLET    Take 30 mg by mouth daily after supper.    OXYCODONE-ACETAMINOPHEN (PERCOCET) 10-325 MG TABLET    Take 1 tablet by mouth every 6 (six) hours as needed for pain.    POLYETHYLENE GLYCOL (MIRALAX / GLYCOLAX) 17 G PACKET    Take 17 g by mouth 2 (two) times daily.   PREGABALIN (LYRICA) 75 MG CAPSULE    Take 75 mg by mouth 2 (two) times daily.    SENNA-DOCUSATE (SENOKOT-S) 8.6-50 MG TABLET    Take 1 tablet by mouth 2 (two) times daily.   ZINC SULFATE 220 MG CAPSULE    Take 1 capsule (220 mg total) by mouth daily.  Modified Medications   No medications on file  Discontinued Medications   No medications on file    Subjective: [name] is in with her husband for her routine follow-up visit.   She has multiple medical problems who underwent cervical spine surgery many years ago leaving her with paraplegia.  She has had trouble with recurrent left sacral pressure sores over the past 3 to 4 years.  She was hospitalized in January 2019 and underwent debridement.  Apparently cultures were negative at that time but she was treated with 6 weeks of IV antibiotics for presumed ischial osteomyelitis.  She was also evaluated by plastic surgery at [facility].  Apparently flap closure of the wound was planned but never performed.  She had poorly controlled diabetes at that time but has been able to get it under much better control.  She also stopped smoking cigarettes.  Her wound finally closed earlier this year and she was discharged from the wound center in May.  She was hospitalized this past June with a Klebsiella UTI and bacteremia.  A CT scan done on 09/25/2018 showed slight irregular appearance of the left ischium compatible with osteomyelitis.  This was very similar to the findings on the CT scan done in January 2019.  She was seen by my partner, Dr. Linus Salmons, at that time who did not find any clinical evidence of infection and recommended focusing on treatment for the bacteremic UTI.  Over the past 6 to 8 weeks she has had a new area on her left buttock open up.  She went back to the wound center on 12/30/2018 and underwent some debridement of the wound.  She saw her urologist on 01/04/2019 for routine change of her suprapubic catheter.  She commented on having some burning suprapubic pain and malodorous urine at that time.  A urine culture was done which grew E. coli and Enterobacter.  She was treated with ciprofloxacin for 7 days and improved.  She returned to the wound center on 01/13/2019 and Dr. Dellia Nims noted some palpable bone at the base of the wound at that time and performed a biopsy.  Gram stain of the bone did not show any organisms but cultures grew methicillin susceptible staph capitis and  Enterococcus.  Her husband, who does dressing changes on a regular basis, does not feel like the wound has gotten any worse recently.  He has not noted any unusual odor or increased drainage.  Both of them expressed some concern that she may have intermittently had some fecal soiling of the wound.  They do not think that she has been leaking any urine from her suprapubic catheter.  After discussing options  with her at the time her of her first visit 1 month ago we elected to try oral Augmentin.  She had some nausea and vomiting with the first dose but found very quickly that she could tolerate it well if she would lay down for a short period after taking it.  She has had no further problems tolerating it.  Her husband notes that her wound is larger but the tissue is reddish-pink and looks much healthier.  Her wound drainage has decreased significantly.  Review of Systems: Review of Systems  Constitutional: Negative for fever.  Gastrointestinal: Positive for nausea and vomiting. Negative for abdominal pain and diarrhea.    Past Medical History:  Diagnosis Date   Anemia    Arthritis    "all over" (04/13/2012)   Cancer of right breast ([place]) 2011   Cervical neuropathy    PERIPHERAL NEUROPATHY , HAD CERVICAL FUSION   Diabetes mellitus without complication ([place])    Difficult intubation    Fastrach #4 LMA then # 7 ETT used in 2006 [facility], cervical laminectomy)   GERD (gastroesophageal reflux disease)    History of kidney stones    Hypercholesteremia    Hypertension    Hypothyroidism    Kidney stones    Neurogenic bladder    Osteomyelitis (HCC)    Paraplegia (HCC)    Peripheral neuropathy    PONV (postoperative nausea and vomiting)    Sacral decubitus ulcer 10/2014   Sepsis due to urinary tract infection ([place])     Social History   Tobacco Use   Smoking status: Former Smoker    Packs/day: 0.50    Years: 50.00    Pack years: 25.00    Types: Cigarettes    Quit  date: 11/06/2014    Years since quitting: 4.3   Smokeless tobacco: Never Used  Substance Use Topics   Alcohol use: No   Drug use: No    Family History  Problem Relation Age of Onset   Heart disease Father     Allergies  Allergen Reactions   Ivp Dye [Iodinated Diagnostic Agents] Other (See Comments)    "hot and sweaty and almost passed out"   Aspirin Hives   Sulfa Antibiotics Hives    Objective: Vitals:   02/24/19 1418  BP: 111/70  Pulse: 85  Temp: 97.6 F (36.4 C)  TempSrc: Oral   There is no height or weight on file to calculate BMI.  Physical Exam Constitutional:      Comments: She is very pleasant and in good spirits.  She is seated in her wheelchair.     Lab Results Sed Rate  Date Value  01/26/2019 28 mm/h  04/27/2017 11 mm/hr  10/28/2015 59 mm/hr (H)   CRP  Date Value  01/26/2019 20.0 mg/L (H)  04/27/2017 18.8 mg/dL (H)  10/28/2015 21.6 mg/dL (H)     Problem List Items Addressed This Visit      High   Chronic osteomyelitis of pelvic region, left [facility])    She seems to be improving clinically on oral Augmentin for pelvic osteomyelitis complicating her left ischial pressure sore.  She saw Dr. Dellia Nims at the wound center last week.  There was no purulence in the wound but bone was easily palpable at the base of the wound.  He suggested considering wound VAC therapy once we feel her infection is likely to have been cured.  I will continue Augmentin for now, repeat her C-reactive protein and see her back in 2 weeks.  Relevant Orders   C-reactive protein       Michel Bickers, MD [facility] for Infectious [facility] [phone] pager   [phone] cell 02/24/2019, 3:06 PM

## 2019-02-25 LAB — C-REACTIVE PROTEIN: CRP: 10.1 mg/L — ABNORMAL HIGH (ref <8.0)

## 2019-02-25 NOTE — Patient Outreach (Signed)
Outreach to Dr. Dellia Nims to request his consideration of ordering a Palliative Care Consult.  [name]. Myrtie Neither, MSN, [facility] Gerontological Nurse Practitioner [facility] Care Management [phone]

## 2019-03-02 DIAGNOSIS — L97409 Non-pressure chronic ulcer of unspecified heel and midfoot with unspecified severity: Secondary | ICD-10-CM | POA: Diagnosis not present

## 2019-03-08 ENCOUNTER — Telehealth: Payer: Self-pay

## 2019-03-08 DIAGNOSIS — R339 Retention of urine, unspecified: Secondary | ICD-10-CM | POA: Diagnosis not present

## 2019-03-08 NOTE — Telephone Encounter (Signed)
COVID-19 Pre-Screening Questions:03/08/19   Do you currently have a fever (>100 F), chills or unexplained body aches?NO  Are you currently experiencing new cough, shortness of breath, sore throat, runny nose? NO  .  Have you recently travelled outside the state of [place] in the last 14 days? NO .  Have you been in contact with someone that is currently pending confirmation of Covid19 testing or has been confirmed to have the Covid19 virus?  NO  **If the patient answers NO to ALL questions -  advise the patient to please call the clinic before coming to the office should any symptoms develop.     

## 2019-03-09 ENCOUNTER — Ambulatory Visit (INDEPENDENT_AMBULATORY_CARE_PROVIDER_SITE_OTHER): Payer: PPO | Admitting: Internal Medicine

## 2019-03-09 ENCOUNTER — Encounter: Payer: Self-pay | Admitting: Internal Medicine

## 2019-03-09 ENCOUNTER — Other Ambulatory Visit: Payer: Self-pay

## 2019-03-09 DIAGNOSIS — M86652 Other chronic osteomyelitis, left thigh: Secondary | ICD-10-CM

## 2019-03-09 NOTE — Progress Notes (Signed)
[name] for Infectious Disease  Patient Active Problem List   Diagnosis Date Noted  . Left perineal ischial pressure ulcer 09/16/2018    Priority: High  . Chronic osteomyelitis of pelvic region, left ([place]) 11/06/2014    Priority: High  . Foley catheter in place 02/03/2019  . GERD (gastroesophageal reflux disease) 02/03/2019  . Hard to intubate 02/03/2019  . Poor vision 02/03/2019  . Former smoker 01/26/2019  . Neuropathy 01/26/2019  . Adjustment disorder with disturbance of emotion 12/02/2018  . Palliative care by specialist   . Advanced care planning/counseling discussion   . Spondylosis, cervical, with myelopathy   . Diabetes type 2, uncontrolled ([place]) 04/26/2017  . Urinary tract infection associated with cystostomy catheter ([place])   . Medication monitoring encounter 12/03/2015  . Hypertension   . History of nephrolithiasis   . AKI (acute kidney injury) ([place]) 08/10/2015  . Paraplegia ([place]) 11/01/2014  . Hypothyroidism 10/31/2014  . Thoracic stenosis 08/23/2014  . Breast cancer of lower-inner quadrant of right female breast ([place]) 12/22/2012  . HTN (hypertension) 04/15/2012  . Left knee DJD 04/13/2012    Class: Chronic    Patient's Medications  New Prescriptions   No medications on file  Previous Medications   AMOXICILLIN-CLAVULANATE (AUGMENTIN) 500-125 MG TABLET    Take 1 tablet (500 mg total) by mouth 2 (two) times daily.   ASCORBIC ACID (VITAMIN C) 500 MG TABLET    Take 1 tablet (500 mg total) by mouth daily.   BACLOFEN (LIORESAL) 20 MG TABLET    Take 20 mg by mouth every 6 (six) hours as needed for muscle spasms.    BISACODYL (DULCOLAX) 10 MG SUPPOSITORY    Place 1 suppository (10 mg total) rectally daily as needed for severe constipation.   [employer]S SENNA 8.6 MG TABLET    TAKE 2 TABLETS BY MOUTH AT BEDTIME AS NEEDED   DOCUSATE SODIUM (COLACE) 100 MG CAPSULE    Take 100 mg by mouth 2 (two) times daily as needed (FOR CONSTIPATION).    DULOXETINE (CYMBALTA)  60 MG CAPSULE    Take 120 mg by mouth daily.    FERROUS SULFATE 325 (65 FE) MG TABLET    Take 325 mg by mouth daily after supper.    FOLIC ACID (FOLVITE) 1 MG TABLET    Take 1 mg by mouth daily.    FUROSEMIDE (LASIX) 40 MG TABLET    80mg  in AM and 40mg  in PM till 09/26/18, then 80mg  in AM from 09/27/18 till 10/03/18, then 40mg  in AM from 10/04/18 onwards   IRBESARTAN (AVAPRO) 300 MG TABLET    Take 300 mg by mouth daily.   LACTULOSE (CHRONULAC) 10 GM/15ML SOLUTION    TAKE 30 MILLILITERS BY MOUTH FOR 1 DOSE   LEVOTHYROXINE (SYNTHROID, LEVOTHROID) 125 MCG TABLET    Take 1 tablet (125 mcg total) by mouth daily before breakfast.   METFORMIN (GLUCOPHAGE) 1000 MG TABLET    Take 1,000 mg by mouth 2 (two) times daily.   METFORMIN (GLUCOPHAGE) 500 MG TABLET    Take 250 mg by mouth 2 (two) times daily with a meal.   METOPROLOL (LOPRESSOR) 100 MG TABLET    Take 100 mg by mouth 2 (two) times daily.    MOMETASONE-FORMOTEROL (DULERA) 100-5 MCG/ACT AERO    Inhale 2 puffs into the lungs 2 (two) times daily.   MULTIPLE VITAMIN (MULTIVITAMIN WITH MINERALS) TABS TABLET    Take 1 tablet by mouth daily.   NIFEDIPINE (  ADALAT CC) 30 MG 24 HR TABLET    Take 30 mg by mouth daily.   NIFEDIPINE (PROCARDIA XL/ADALAT-CC) 30 MG 24 HR TABLET    Take 30 mg by mouth daily after supper.    OXYCODONE-ACETAMINOPHEN (PERCOCET) 10-325 MG TABLET    Take 1 tablet by mouth every 6 (six) hours as needed for pain.    POLYETHYLENE GLYCOL (MIRALAX / GLYCOLAX) 17 G PACKET    Take 17 g by mouth 2 (two) times daily.   PREGABALIN (LYRICA) 75 MG CAPSULE    Take 75 mg by mouth 2 (two) times daily.    SENNA-DOCUSATE (SENOKOT-S) 8.6-50 MG TABLET    Take 1 tablet by mouth 2 (two) times daily.   ZINC SULFATE 220 MG CAPSULE    Take 1 capsule (220 mg total) by mouth daily.  Modified Medications   No medications on file  Discontinued Medications   No medications on file    Subjective: [name] is in with her husband for her routine follow-up visit.   She has multiple medical problems who underwent cervical spine surgery many years ago leaving her with paraplegia.  She has had trouble with recurrent left sacral pressure sores over the past 3 to 4 years.  She was hospitalized in January 2019 and underwent debridement.  Apparently cultures were negative at that time but she was treated with 6 weeks of IV antibiotics for presumed ischial osteomyelitis.  She was also evaluated by plastic surgery at [facility].  Apparently flap closure of the wound was planned but never performed.  She had poorly controlled diabetes at that time but has been able to get it under much better control.  She also stopped smoking cigarettes.  Her wound finally closed earlier this year and she was discharged from the wound center in May.  She was hospitalized this past June with a Klebsiella UTI and bacteremia.  A CT scan done on 09/25/2018 showed slight irregular appearance of the left ischium compatible with osteomyelitis.  This was very similar to the findings on the CT scan done in January 2019.  She was seen by my partner, Dr. Linus Salmons, at that time who did not find any clinical evidence of infection and recommended focusing on treatment for the bacteremic UTI.  Over the past 6 to 8 weeks she has had a new area on her left buttock open up.  She went back to the wound center on 12/30/2018 and underwent some debridement of the wound.  She saw her urologist on 01/04/2019 for routine change of her suprapubic catheter.  She commented on having some burning suprapubic pain and malodorous urine at that time.  A urine culture was done which grew E. coli and Enterobacter.  She was treated with ciprofloxacin for 7 days and improved.  She returned to the wound center on 01/13/2019 and Dr. Dellia Nims noted some palpable bone at the base of the wound at that time and performed a biopsy.  Gram stain of the bone did not show any organisms but cultures grew methicillin susceptible staph capitis and  Enterococcus.  Her husband, who does dressing changes on a regular basis, does not feel like the wound has gotten any worse recently.  He has not noted any unusual odor or increased drainage.  Both of them expressed some concern that she may have intermittently had some fecal soiling of the wound.  They do not think that she has been leaking any urine from her suprapubic catheter.  After discussing options  with her at the time her of her first visit 6 weeks ago we elected to try oral Augmentin.  She had some nausea and vomiting with the first dose but found very quickly that she could tolerate it well if she would lay down for a short period after taking it.  She has had no further problems tolerating it.  Her husband notes that her wound is unchanged in size over the past 2 weeks but the tissue looks much healthier.  Her wound drainage is minimal.  She follows up with Dr. Dellia Nims at the wound center tomorrow.  Review of Systems: Review of Systems  Constitutional: Negative for fever.  Gastrointestinal: Negative for abdominal pain, diarrhea, nausea and vomiting.    Past Medical History:  Diagnosis Date  . Anemia   . Arthritis    "all over" (04/13/2012)  . Cancer of right breast ([place]) 2011  . Cervical neuropathy    PERIPHERAL NEUROPATHY , HAD CERVICAL FUSION  . Diabetes mellitus without complication ([place])   . Difficult intubation    Fastrach #4 LMA then # 7 ETT used in 2006 [facility], cervical laminectomy)  . GERD (gastroesophageal reflux disease)   . History of kidney stones   . Hypercholesteremia   . Hypertension   . Hypothyroidism   . Kidney stones   . Neurogenic bladder   . Osteomyelitis ([place])   . Paraplegia ([place])   . Peripheral neuropathy   . PONV (postoperative nausea and vomiting)   . Sacral decubitus ulcer 10/2014  . Sepsis due to urinary tract infection (HCC)     Social History   Tobacco Use  . Smoking status: Former Smoker    Packs/day: 0.50    Years: 50.00    Pack  years: 25.00    Types: Cigarettes    Quit date: 11/06/2014    Years since quitting: 4.3  . Smokeless tobacco: Never Used  Substance Use Topics  . Alcohol use: No  . Drug use: No    Family History  Problem Relation Age of Onset  . Heart disease Father     Allergies  Allergen Reactions  . Ivp Dye [Iodinated Diagnostic Agents] Other (See Comments)    "hot and sweaty and almost passed out"  . Aspirin Hives  . Sulfa Antibiotics Hives    Objective: Vitals:   03/09/19 1449  BP: 118/71  Pulse: 86  Temp: 97.9 F (36.6 C)   There is no height or weight on file to calculate BMI.  Physical Exam Constitutional:      Comments: She is very pleasant and in good spirits.  She is seated in her wheelchair.     Lab Results Sed Rate  Date Value  01/26/2019 28 mm/h  04/27/2017 11 mm/hr  10/28/2015 59 mm/hr (H)   CRP  Date Value  02/24/2019 10.1 mg/L (H)  01/26/2019 20.0 mg/L (H)  04/27/2017 18.8 mg/dL (H)     Problem List Items Addressed This Visit      High   Chronic osteomyelitis of pelvic region, left (HCC)    It sounds like her wound is showing some improvement.  Her sed rate is normal and her C-reactive protein has decreased by one half.  We will repeat her C-reactive protein again today and have her finish 2 more weeks of amoxicillin clavulanate.  She will follow-up here in January.      Relevant Orders   C-reactive protein       Michel Bickers, MD [facility] for Infectious Disease [facility] [phone] pager   [phone] cell 03/09/2019, 3:13 PM

## 2019-03-09 NOTE — Assessment & Plan Note (Signed)
It sounds like her wound is showing some improvement.  Her sed rate is normal and her C-reactive protein has decreased by one half.  We will repeat her C-reactive protein again today and have her finish 2 more weeks of amoxicillin clavulanate.  She will follow-up here in January.

## 2019-03-10 ENCOUNTER — Encounter (HOSPITAL_BASED_OUTPATIENT_CLINIC_OR_DEPARTMENT_OTHER): Payer: PPO | Attending: Internal Medicine | Admitting: Internal Medicine

## 2019-03-10 DIAGNOSIS — G8220 Paraplegia, unspecified: Secondary | ICD-10-CM | POA: Diagnosis not present

## 2019-03-10 DIAGNOSIS — G894 Chronic pain syndrome: Secondary | ICD-10-CM | POA: Diagnosis not present

## 2019-03-10 DIAGNOSIS — E1169 Type 2 diabetes mellitus with other specified complication: Secondary | ICD-10-CM | POA: Insufficient documentation

## 2019-03-10 DIAGNOSIS — L89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, stage 4: Secondary | ICD-10-CM | POA: Insufficient documentation

## 2019-03-10 DIAGNOSIS — M8668 Other chronic osteomyelitis, other site: Secondary | ICD-10-CM | POA: Diagnosis not present

## 2019-03-10 DIAGNOSIS — L89329 Pressure ulcer of left buttock, unspecified stage: Secondary | ICD-10-CM | POA: Diagnosis present

## 2019-03-10 DIAGNOSIS — Z853 Personal history of malignant neoplasm of breast: Secondary | ICD-10-CM | POA: Diagnosis not present

## 2019-03-10 DIAGNOSIS — M86652 Other chronic osteomyelitis, left thigh: Secondary | ICD-10-CM | POA: Diagnosis not present

## 2019-03-10 DIAGNOSIS — E11622 Type 2 diabetes mellitus with other skin ulcer: Secondary | ICD-10-CM | POA: Insufficient documentation

## 2019-03-10 DIAGNOSIS — I10 Essential (primary) hypertension: Secondary | ICD-10-CM | POA: Insufficient documentation

## 2019-03-10 DIAGNOSIS — E039 Hypothyroidism, unspecified: Secondary | ICD-10-CM | POA: Insufficient documentation

## 2019-03-10 DIAGNOSIS — Z87891 Personal history of nicotine dependence: Secondary | ICD-10-CM | POA: Insufficient documentation

## 2019-03-10 LAB — C-REACTIVE PROTEIN: CRP: 17.1 mg/L — ABNORMAL HIGH (ref <8.0)

## 2019-03-11 NOTE — Progress Notes (Signed)
[name], [name] ([id number]) Visit Report for 03/10/2019 Debridement Details Patient Name: Date of Service: [name], [name] 03/10/2019 10:30 AM Medical Record Number:[id number] Patient Account Number: [id number] Date of Birth/Sex: Treating RN: 1951-02-28 (68 y.o. F) Primary Care Provider: [name] Other Clinician: Referring Provider: Treating Provider/Extender:Daisy Mcneel, [name], [name] in Treatment: 10 Debridement Performed for Wound #12 Left Ischial Tuberosity Assessment: Performed By: Clinician Deon Pilling, RN Debridement Type: Chemical/Enzymatic/Mechanical Agent Used: Santyl Level of Consciousness (Pre- Awake and Alert procedure): Pre-procedure Verification/Time Out Taken: Yes - 12:40 Start Time: 12:41 Instrument: Other : tongue blade Bleeding: None End Time: 12:42 Procedural Pain: 0 Post Procedural Pain: 0 Response to Treatment: Procedure was tolerated well Level of Consciousness Awake and Alert (Post-procedure): Post Debridement Measurements of Total Wound Length: (cm) 1.7 Stage: Category/Stage IV Width: (cm) 1.5 Depth: (cm) 2.8 Volume: (cm) 5.608 Character of Wound/Ulcer Post Requires Further Debridement Debridement: Post Procedure Diagnosis Same as Pre-procedure Electronic Signature(s) Signed: 03/11/2019 7:57:15 AM By: Linton Ham MD Entered By: Linton Ham on 03/10/2019 13:22:57 -------------------------------------------------------------------------------- HPI Details Patient Name: Date of Service: [name] 03/10/2019 10:30 AM Medical Record [id number] Patient Account Number: [id number] Date of Birth/Sex: Treating RN: 1950-11-22 (68 y.o. F) Primary Care Provider: [name] Other Clinician: Referring Provider: Treating Provider/Extender:Esdras Delair, [name], [name] in Treatment: 10 History of Present Illness Location: open ulceration of the left gluteal area, left heel and right ankle for about 5 months. Quality: Patient  reports No Pain. Severity: Patient states wound(s) are getting worse. Duration: Patient has had the wound for > 5 months prior to seeking treatment at the wound center Context: The wound occurred when the patient has been paraplegic for about 3 years. Modifying Factors: Wound improving due to current treatment. Associated Signs and Symptoms: Patient reports having foul odor. HPI Description: this 68 year old patient who is known to have hypertension, hypothyroidism, breast cancer, chronic pain syndrome, paraplegia was noted to have a left gluteal decubitus ulcer and was brought into the hospital. During the course of her hospitalization she was debrided in the operating room by Dr. Leland Johns and had all the wounds sharply debrided. The debridement was done for the left ischial wound, the left heel wound and the right ankle wound. Bone cultures were taken at that time but were negative but clinically she was treated for osteomyelitis because of the probing down to bone and open exposed bone. Home health has been giving her antibioticss which include vancomycin and Zosyn. The patient was a smoker until about 3 weeks ago and used to smoke about 10 cigarettes a day for a long while. 12/13/2014 - details of her operative note from 11/03/2014 were reviewed -- PROCEDURE: 1. Excisional debridement skin, subcutaneous, muscle left ischium 35 cm2 2. Excisional debridement skin, subcutaneous tissue left heel 27 cm2 3. Excisional debridement right ankle skin, subcutaneous, bone 30 cm2 01/24/2015 -- she has some issues with her wheelchair cushion but other than that is doing very well and has received Podus boots for her feet. 02/14/2015 -- she was using her old offloading boots and this seemed to have caused her a new pressure ulcer on the left posterior heel near the superior part just below the Achilles tendon. 03/07/2015 -- she has a new ulceration just to the left of the midline on her sacral region more  on the left buttock and this has been there for about a week. 08/22/2015 -- was recently admitted to hospital between May 5 and 08/13/2015, with sepsis and leukocytosis due to a UTI. she was  treated for a sepsis complicating Escherichia coli UTI and kidney stones. She also had metabolic and careful up at the secondary to pyelonephritis. He received broad-spectrum antibiotics initially and then received Macrobid as per urology. She was sent home on nitrofurantoin. during her admission she had a CT scan which showed exposed left ischial tuberosity without evidence of osteolysis. 09/12/2015-- the patient is having some issues with her air mattress and would like to get a opinion from medical modalities. 10/10/2015 -- the issue with her air mattress has not yet been sorted out and the new problem seems to be a lot of odor from the wound VAC. 11/27/2015 -- the patient was admitted to the hospital between July 23 and 10/31/2015. Her problems were sepsis, osteomyelitis of the pelvic bone and acute pyelonephritis. CT of the abdomen and pelvis was consistent with a left- sided pyelonephritis with hydronephrosis and also just showed new sclerosis of the posterior portion of the left anterior pubic ramus suggestive of periosteal reaction consistent with osteomyelitis. She was treated for the osteomyelitis with infectious disease consult recommending 6 weeks of IV antibiotics including vancomycin and Rocephin and the antibiotics were to go on until 12/10/2015. He was seen by Dr. Iran Planas plastic surgery and Dr. Linus Salmons of infectious disease. She had a suprapubic catheter placed during the admission. CT scan done on 10/28/2015 showed specifically -- New sclerosis of the posterior portion of the left inferior pubic ramus with aggressive periosteal reaction, consistent with osteomyelitis, with adjacent soft tissue gas compatible with previously described decubitus ulcer. 12/12/2015 -- she was recently seen by  Dr. Linus Salmons, who noted good improvement and CRP and ESR compared to before and he has stopped her antibiotic as per plans to finish on September 4. The patient was encouraged to continue with wound care and consider hyperbaric oxygen therapy. Today she tells me that she has consented to undergo hyperbaric oxygen therapy and we can start the paperwork. 01/02/2016 -- her PCP had gained about 3 years but she still persists in having problems during hyperbaric oxygen therapy with some discomfort in the ears. 01/09/16; pressure area with underlying osteomyelitis in the left buttock. Wound bed itself has some slight amount of grayish surface slough however I do not think any debridement was necessary. There is no exposed bone soft tissue appears stable. She is using a wound VAC 01/16/16; back for weekly wound review in conjunction with HBO. She has a deep wound over the left initial tuberosity previously treated with 6 weeks of IV antibiotics for osteomyelitis. Wound bed looks reasonably healthy although the base of this is still precariously close to bone. She has been using a wound VAC. 01/23/2016 -- she has completed her course of antibiotics and this week the only new thing is her right great toe nail was avulsed and she has got an open wound over the nailbed. 01/31/16 she has completed her course of antibiotics. Her right great toenail avulsed last week and she's been using silver alginate for this as well. Still using a wound VAC to the substantial stage IV wound over the left ischial tuberosity 03/05/2016 -- the patient has had a opinion from the plastic surgery group at [facility] and details of this are not available yet but the patient's verbal report has been heard by me. Did not sound like there was any optimistic discussion regarding reconstruction and the net result would be to continue with the wound VAC application. I will await the official reports. Addendum: -- she was seen at  [facility] by Dr. Tressa Busman. After a thorough review and from what I understand spending 45 minutes with the patient his assessment has been noted by me in detail and the management options were: 1. Continued pressure offloading and wound care versus operative procedures including wound excision 2. Soft tissue and bone sampling 3. If the wound gets larger wound closure would be done using a variety of plastic surgical techniques including but not limited to skin substitute, possible skin graft, local versus regional flaps, negative pressure dressing application. 4. He discussed with her details of flap surgery and the risks associated 5. He made a comment that since the patient was operated on by Dr. Leland Johns of [facility] plastic surgery unit in [place] the patient may continue to follow-up there for further evaluation for surgical flap closure in the future. 03/19/2016 -- the patient continues to be rather depressed and frustrated with her lack of rapid progress in healing this wound especially because she thought after hyperbaric oxygen therapy the wound would heal extremely fast. She now understands that was not the implied benefit on wound care which was the recommendation for hyperbaric oxygen therapy. I have had a lengthy discussion with the patient and her husband regarding her options: 1. Continue with collagen and wound VAC for the primary dressing and offloading and all supportive care. 2. See Dr. Iran Planas for possible placement of Acell or Integra in the OR. 3. get a second opinion from a wound care center and surrounding regions/counties 05/07/2016 -- Note from Dr. Celedonio Miyamoto, who noted that the patient has declined flap surgery. She has discussed application of A cell, and try a few applications to see how the wound progresses. She is also recommended that we could apply products here in the wound center, like  Oasis. during her preop workup it was found that her hemoglobin A1c was 11% and she has now been diagnosed as having diabetes mellitus and has been put on appropriate treatment by her PCP 05/28/2016 -- tells me her blood sugars have been doing well and she has an appointment to see her PCP in the next couple of weeks to check her hemoglobin A1c. Other than that she continues to do well. 06/25/2016 -- have not seen her back for the last month but she says her health has been about the same and she has an appointment to check the A1c next week 09/10/16 ---- was seen by Dr. Celedonio Miyamoto -- who applied Acell and saw her back in follow-up. She has recommended silver alginate to the wound every other day and cover with foam. If no significant drainage could transition to collagen every other day. She recommended discontinuing wound VAC. There were no plans to repeat application of Acell. The patient expressed that her husband could do the wound care as going to the Wound Ctr., would cost several $100 for each visit. 10/21/2016 -- her insurance company is getting her new mattress and she is pleased about that. Other than that she has been doing dressings with PolyMem Silver and has been doing very well 02/18/2017 -- she has gone through several changes of her mattress and has not been pleased with any of them. The ventricles are still working on trying to fit her with the appropriate low air-loss mattress. She has a new wound on the gluteal area which is clearly separated from the original wound. 03/25/17-she is here in follow-up evaluation for her left ischialpressure ulcer. She remains  unsatisfied with her pressure mattress. She admits to sitting multiple hours a day, in the bed. We have discussed offloading options. The wound does not appear infected. Nutrition does not appear to be a concern. Will follow-up in 4 weeks, if wound continues to be stalled may consider x-ray to evaluate for refractory  osteomyelitis. 04/21/17; this is a patient that I don't know all that well. She has a chronic wound which at one point had underlying osteomyelitis in the left ischial tuberosity. This is a stage IV pressure ulcer. Over the last 3 months she has a stage II wound inferiorly to the original wound. The last time she was here her dressing was changed to silver collagen although the patient's husband who changes the dressing said that the collagen stuck to the wound and remove skin from the superficial area therefore he switched back to [place] 05/13/17; this is a patient we've been following for a left ischial tuberosity wound which was stage IV at one point had underlying osteomyelitis. Over the last several months she's had a stage II wound just inferior and medial to the related to the wound. According to her husband he is using Endoform layer with collagen although this is not what I had last time. According to her husband they are using Elgie Congo with collagen although I don't quite know how that started. She was hospitalized from 1/20 through 04/30/16. This was related to a UTI. Her blood cultures were negative, urine culture showed multiple species. She did have a CT scan of the abdomen and pelvis which documented chronic osteomyelitis in the area of the wound inflammatory markers were unremarkable. She has had prior knowledge of osteomyelitis. It looks as though she received IV antibiotics in 2017 and was treated with a course of hyperbaric oxygen. 05/28/17; the wound over the left ischial tuberosity is deeper today and abuts clearly on bone. Nursing intake reported drainage. I therefore culture of the wound. The more superficial area just below this looks about the same. They once again complained that there are mattress cover is not working although apparently [facility] is been noted to see this many times in the report is that the device is functional 06/18/17; the patient had a  probing area on the left ischial tuberosity that was draining purulent fluid last time. This also clearly seemed to have open bone. Culture I did showed pansensitive pseudomonas including third generation cephalosporins. I treated this with cefdinir 300 twice a day for 10 days and things seem to have improved. She has a more superficial wound just underneath this area. Amazingly she has a new air mattress through [facility]. I think they gave this to her as a parking give. In any case this now works according to the patient may have something to do with why the areas are looking better. 07/09/17; the patient has a probing area in the left ischial tuberosity that still has some depth. However this is contracted in terms of the wound orifice although the depth is still roughly the same. There is no undermining. She also has the satellite wound which is more superficial. This appears to have a healthy surface we've been using silver collagen 08/06/17; the patient's wound is over the left ischial tuberosity and a satellite lesion just underneath this. The original wound was actually a deep stage 4 wound. We have made good progress in 2 months and there is no longer exposed bone here. 09/03/17; left ischial tuberosity actually appears to be  quite healthy. I think we are making progress. No debridement is required. There is no surrounding erythema 10/01/17 I follow this patient monthly for her left ischial tuberosity wound. There is 2 areas the original area and a satellite area. The satellite area looks a lot better there is no surrounding erythema. Her husband relates that he is having trouble maintaining the dressing. This has to do with the soft tissue around it. He states he puts the collagen in but he cannot make sure that it stays in even with the ABD pads and tape that he is been using 10/29/17; patient arrives with a better looking noon today. Some of the satellite lesions have closed. using  Prisma 11/26/17; the patient has a large cone-shaped area with the tip of the Cone deep within her buttock soft tissue. The walls of the Cone are epithelialized however the base is still open. The area at the base of this looks moist we've been using silver collagen. Will change to silver alginate 12/31/2017; the wound appears to have come in fairly nicely. Using silver alginate. There is no surrounding maceration or infection 01/28/18; there is still an open area here over the left initial tuberosity. Base of this however looks healthy. There is no surrounding infection 02/25/18; the area of its open is over the left ischial tuberosity. The base of this is where the wound is. This is a large inverted cone-shaped area with the wound at the tip. Dimensions of the wound at the tip are improved. There is a area of denuded skin about halfway towards the tip which her husband thinks may have happened today when he was bathing her. 04/20/17; the area is still open over the left initial tuberosity. 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 This is now completely closed over although it is closed over with a divot and skin over bone at the base of this. Continued aggressive offloading will be necessary. 12/30/2018 READMISSION This is a 68 year old woman with chronic paraplegia. I picked her up for her care from Dr. Con Memos in this clinic after he departed. She had a stage IV pressure wound over the left ischial tuberosity. She was treated twice for her underlying osteomyelitis and this I believe firstly in 2016 and again in 2017. There were some plans at some point for flap closure of this however she was discovered to have uncontrolled diabetes and I do not think this was ever accomplished. She ultimately healed  over in this clinic and was discharged in May. She has a large cone-shaped indentation with the tip of this going towards the left ischial tuberosity. It is not an easy area to examine but at that time I thought all of this was epithelialized. Apparently there was a reopening here shortly after she left the clinic last time. She was admitted to hospital at the end of June for Klebsiella bacteremia felt to be secondary to UTI. A CT scan of the pelvis is listed below and there was initially some concern that she had underlying osteomyelitis although I believe she was seen by infectious disease and that was felt to be not the case: I do not see any new cultures or inflammatory markers IMPRESSION: 1. No CT evidence for acute intra-abdominal or pelvic abnormality. Large volume of stool throughout the colon.  2. Enlarged fatty liver with fat sparing near the gallbladder fossa 3. Cortical scarring right kidney. Bilateral intrarenal stones without hydronephrosis. Thick-walled urinary bladder decompressed by suprapubic catheter 4. Deep left decubitus ulcer with underlying left ischial changes suggesting osteomyelitis. Her husband has been using silver collagen in the wound. She has not been systemically unwell no fever chills eating and drinking well. They rigorously offload this wound only getting up in the wheelchair when she is going to appointments the rest of the time she is in bed. 10/8; wound measures larger and she now has exposed bone. We have been using silver alginate 11/12 still using silver alginate. The patient saw Dr. Megan Salon of infectious disease. She was started on Augmentin 500 mg twice daily. She is due to follow-up with Dr. Megan Salon I believe next week. Lab work Dr. Megan Salon requested showed a sedimentation rate of 28 and CRP of 20 although her CRP 1 year ago was 18.8. Sedimentation rate 1 year ago was 11 basic metabolic panel showed a creatinine of 1.12 12/3; the patient followed  up with Dr. Megan Salon yesterday. She is still on Augmentin twice daily. This was directed by Dr. Megan Salon. The patient's inflammatory markers have improved which is gratifying. Her C-reactive protein was repeated yesterday and follow-up booked with infectious disease in January. In addition I have been getting secure text messages I think from palliative care through [facility] [employer]. I think they were hoping to provide services to the patient in her home. They could not get a hold of the primary physician and so they reached out to me on 2 separate occasions. Electronic Signature(s) Signed: 03/11/2019 7:57:15 AM By: Linton Ham MD Entered By: Linton Ham on 03/10/2019 13:25:18 -------------------------------------------------------------------------------- Physical Exam Details Patient Name: Date of Service: [name], [name] 03/10/2019 10:30 AM Medical Record [id number] Patient Account Number: [id number] Date of Birth/Sex: Treating RN: Dec 03, 1950 (68 y.o. F) Primary Care Provider: Other Clinician: [name] Referring Provider: Treating Provider/Extender:Ziasia Lenoir, [name], [name] in Treatment: 10 Constitutional Sitting or standing Blood Pressure is within target range for patient.. Pulse regular and within target range for patient.Marland Kitchen Respirations regular, non-labored and within target range.. Temperature is normal and within the target range for the patient.Marland Kitchen Appears in no distress. Eyes Conjunctivae clear. No discharge.no icterus. Respiratory work of breathing is normal. Cardiovascular Patient appears well-hydrated. Gastrointestinal (GI) Abdomen is soft and non-distended without masses or tenderness.Marland Kitchen Psychiatric appears at normal baseline. Notes Wound exam Left ischial tuberosity. This still goes down to bone at the base. There is tenderness here. The walls of the cone- shaped wound are still covered in necrotic debris. This is going to  require debridement before any attempt to see if we can get some granulation. Electronic Signature(s) Signed: 03/11/2019 7:57:15 AM By: Linton Ham MD Entered By: Linton Ham on 03/10/2019 95:63:87 -------------------------------------------------------------------------------- Physician Orders Details Patient Name: Date of Service: [name], [name] 03/10/2019 10:30 AM Medical Record [id number] Patient Account Number: [id number] Date of Birth/Sex: Treating RN: 09/25/1950 (68 y.o. [name], [name] Primary Care Provider: [name] Other Clinician: Referring Provider: Treating Provider/Extender:Emmaleah Meroney, [name], [name] in Treatment: 10 Verbal / Phone Orders: No Diagnosis Coding ICD-10 Coding Code Description 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, stage 4 G82.20 Paraplegia, unspecified E11.622 Type 2 diabetes mellitus with other skin ulcer M86.68 Other chronic osteomyelitis, other site Follow-up Appointments Return Appointment in 2 weeks. Dressing Change Frequency Change dressing every day. Skin Barriers/Peri-Wound Care Skin Prep - to periwound. Barrier cream - as needed for  maceration, wetness, or redness to periwound. Wound Cleansing Wound #12 Left Ischial Tuberosity May shower and wash wound with soap and water. - or wound cleanser Primary Wound Dressing Wound #12 Left Ischial Tuberosity Santyl Ointment - apply to wound bed. apply saline moisten gauze over Santyl. Secondary Dressing Wound #12 Left Ischial Tuberosity ABD pad - or bordered foam. Off-Loading Low air-loss mattress (Group 2) - continue to use Gel wheelchair cushion - continue to use specialty wheelchair cushion. Turn and reposition every 2 hours Additional Orders / Instructions Other: - patient to call wound center if insurance does not cover santyl and cannot coupon. Patient Medications Allergies: Iodinated Contrast Media - IV Dye, aspirin, Sulfa (Sulfonamide Antibiotics) Notifications  Medication Indication Start End Santyl pressure ulcer 03/10/2019 left buttock DOSE topical 250 unit/gram ointment - ointment topical to wounds change daily Electronic Signature(s) Signed: 03/10/2019 1:00:18 PM By: Linton Ham MD Entered By: Linton Ham on 03/10/2019 13:00:17 -------------------------------------------------------------------------------- Problem List Details Patient Name: Date of Service: [name], [name] 03/10/2019 10:30 AM Medical Record [id number] Patient Account Number: [id number] Date of Birth/Sex: Treating RN: 01/06/1951 (68 y.o. [name], [name] Primary Care Provider: [name] Other Clinician: Referring Provider: Treating Provider/Extender:Damaris Abeln, [name], [name] in Treatment: 10 Active Problems ICD-10 Evaluated Encounter Code Description Active Date Today Diagnosis 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, stage 4 12/30/2018 No Yes G82.20 Paraplegia, unspecified 12/30/2018 No Yes E11.622 Type 2 diabetes mellitus with other skin ulcer 12/30/2018 No Yes M86.68 Other chronic osteomyelitis, other site 02/17/2019 No Yes Inactive Problems Resolved Problems Electronic Signature(s) Signed: 03/11/2019 7:57:15 AM By: Linton Ham MD Entered By: Linton Ham on 03/10/2019 13:22:42 -------------------------------------------------------------------------------- Progress Note Details Patient Name: Date of Service: [name], [name] 03/10/2019 10:30 AM Medical Record [id number] Patient Account Number: [id number] Date of Birth/Sex: Treating RN: 05/07/1950 (68 y.o. F) Primary Care Provider: [name] Other Clinician: Referring Provider: Treating Provider/Extender:Stehanie Ekstrom, [name], [name] in Treatment: 10 Subjective History of Present Illness (HPI) The following HPI elements were documented for the patient's wound: Location: open ulceration of the left gluteal area, left heel and right ankle for about 5 months. Quality: Patient  reports No Pain. Severity: Patient states wound(s) are getting worse. Duration: Patient has had the wound for > 5 months prior to seeking treatment at the wound center Context: The wound occurred when the patient has been paraplegic for about 3 years. Modifying Factors: Wound improving due to current treatment. Associated Signs and Symptoms: Patient reports having foul odor. this 68 year old patient who is known to have hypertension, hypothyroidism, breast cancer, chronic pain syndrome, paraplegia was noted to have a left gluteal decubitus ulcer and was brought into the hospital. During the course of her hospitalization she was debrided in the operating room by Dr. Leland Johns and had all the wounds sharply debrided. The debridement was done for the left ischial wound, the left heel wound and the right ankle wound. Bone cultures were taken at that time but were negative but clinically she was treated for osteomyelitis because of the probing down to bone and open exposed bone. Home health has been giving her antibioticss which include vancomycin and Zosyn. The patient was a smoker until about 3 weeks ago and used to smoke about 10 cigarettes a day for a long while. 12/13/2014 - details of her operative note from 11/03/2014 were reviewed -- PROCEDURE: 1. Excisional debridement skin, subcutaneous, muscle left ischium 35 cm2 2. Excisional debridement skin, subcutaneous tissue left heel 27 cm2 3. Excisional debridement right ankle skin, subcutaneous, bone 30  cm2 01/24/2015 -- she has some issues with her wheelchair cushion but other than that is doing very well and has received Podus boots for her feet. 02/14/2015 -- she was using her old offloading boots and this seemed to have caused her a new pressure ulcer on the left posterior heel near the superior part just below the Achilles tendon. 03/07/2015 -- she has a new ulceration just to the left of the midline on her sacral region more on the left  buttock and this has been there for about a week. 08/22/2015 -- was recently admitted to hospital between May 5 and 08/13/2015, with sepsis and leukocytosis due to a UTI. she was treated for a sepsis complicating Escherichia coli UTI and kidney stones. She also had metabolic and careful up at the secondary to pyelonephritis. He received broad-spectrum antibiotics initially and then received Macrobid as per urology. She was sent home on nitrofurantoin. during her admission she had a CT scan which showed exposed left ischial tuberosity without evidence of osteolysis. 09/12/2015-- the patient is having some issues with her air mattress and would like to get a opinion from medical modalities. 10/10/2015 -- the issue with her air mattress has not yet been sorted out and the new problem seems to be a lot of odor from the wound VAC. 11/27/2015 -- the patient was admitted to the hospital between July 23 and 10/31/2015. Her problems were sepsis, osteomyelitis of the pelvic bone and acute pyelonephritis. CT of the abdomen and pelvis was consistent with a left- sided pyelonephritis with hydronephrosis and also just showed new sclerosis of the posterior portion of the left anterior pubic ramus suggestive of periosteal reaction consistent with osteomyelitis. She was treated for the osteomyelitis with infectious disease consult recommending 6 weeks of IV antibiotics including vancomycin and Rocephin and the antibiotics were to go on until 12/10/2015. He was seen by Dr. Iran Planas plastic surgery and Dr. Linus Salmons of infectious disease. She had a suprapubic catheter placed during the admission. CT scan done on 10/28/2015 showed specifically -- New sclerosis of the posterior portion of the left inferior pubic ramus with aggressive periosteal reaction, consistent with osteomyelitis, with adjacent soft tissue gas compatible with previously described decubitus ulcer. 12/12/2015 -- she was recently seen by Dr. Linus Salmons,  who noted good improvement and CRP and ESR compared to before and he has stopped her antibiotic as per plans to finish on September 4. The patient was encouraged to continue with wound care and consider hyperbaric oxygen therapy. Today she tells me that she has consented to undergo hyperbaric oxygen therapy and we can start the paperwork. 01/02/2016 -- her PCP had gained about 3 years but she still persists in having problems during hyperbaric oxygen therapy with some discomfort in the ears. 01/09/16; pressure area with underlying osteomyelitis in the left buttock. Wound bed itself has some slight amount of grayish surface slough however I do not think any debridement was necessary. There is no exposed bone soft tissue appears stable. She is using a wound VAC 01/16/16; back for weekly wound review in conjunction with HBO. She has a deep wound over the left initial tuberosity previously treated with 6 weeks of IV antibiotics for osteomyelitis. Wound bed looks reasonably healthy although the base of this is still precariously close to bone. She has been using a wound VAC. 01/23/2016 -- she has completed her course of antibiotics and this week the only new thing is her right great toe nail was avulsed and she has got an  open wound over the nailbed. 01/31/16 she has completed her course of antibiotics. Her right great toenail avulsed last week and she's been using silver alginate for this as well. Still using a wound VAC to the substantial stage IV wound over the left ischial tuberosity 03/05/2016 -- the patient has had a opinion from the plastic surgery group at [facility] and details of this are not available yet but the patient's verbal report has been heard by me. Did not sound like there was any optimistic discussion regarding reconstruction and the net result would be to continue with the wound VAC application. I will await the official reports. Addendum: -- she was seen at [place] surgery service by Dr. Tressa Busman. After a thorough review and from what I understand spending 45 minutes with the patient his assessment has been noted by me in detail and the management options were: 1. Continued pressure offloading and wound care versus operative procedures including wound excision 2. Soft tissue and bone sampling 3. If the wound gets larger wound closure would be done using a variety of plastic surgical techniques including but not limited to skin substitute, possible skin graft, local versus regional flaps, negative pressure dressing application. 4. He discussed with her details of flap surgery and the risks associated 5. He made a comment that since the patient was operated on by Dr. Leland Johns of [facility] plastic surgery unit in [place] the patient may continue to follow-up there for further evaluation for surgical flap closure in the future. 03/19/2016 -- the patient continues to be rather depressed and frustrated with her lack of rapid progress in healing this wound especially because she thought after hyperbaric oxygen therapy the wound would heal extremely fast. She now understands that was not the implied benefit on wound care which was the recommendation for hyperbaric oxygen therapy. I have had a lengthy discussion with the patient and her husband regarding her options: 1. Continue with collagen and wound VAC for the primary dressing and offloading and all supportive care. 2. See Dr. Iran Planas for possible placement of Acell or Integra in the OR. 3. get a second opinion from a wound care center and surrounding regions/counties 05/07/2016 -- Note from Dr. Celedonio Miyamoto, who noted that the patient has declined flap surgery. She has discussed application of A cell, and try a few applications to see how the wound progresses. She is also recommended that we could apply products here in the wound center, like Oasis. during her preop  workup it was found that her hemoglobin A1c was 11% and she has now been diagnosed as having diabetes mellitus and has been put on appropriate treatment by her PCP 05/28/2016 -- tells me her blood sugars have been doing well and she has an appointment to see her PCP in the next couple of weeks to check her hemoglobin A1c. Other than that she continues to do well. 06/25/2016 -- have not seen her back for the last month but she says her health has been about the same and she has an appointment to check the A1c next week 09/10/16 ---- was seen by Dr. Celedonio Miyamoto -- who applied Acell and saw her back in follow-up. She has recommended silver alginate to the wound every other day and cover with foam. If no significant drainage could transition to collagen every other day. She recommended discontinuing wound VAC. There were no plans to repeat application of Acell. The patient expressed that her husband could  do the wound care as going to the Wound Ctr., would cost several $100 for each visit. 10/21/2016 -- her insurance company is getting her new mattress and she is pleased about that. Other than that she has been doing dressings with PolyMem Silver and has been doing very well 02/18/2017 -- she has gone through several changes of her mattress and has not been pleased with any of them. The ventricles are still working on trying to fit her with the appropriate low air-loss mattress. She has a new wound on the gluteal area which is clearly separated from the original wound. 03/25/17-she is here in follow-up evaluation for her left ischialpressure ulcer. She remains unsatisfied with her pressure mattress. She admits to sitting multiple hours a day, in the bed. We have discussed offloading options. The wound does not appear infected. Nutrition does not appear to be a concern. Will follow-up in 4 weeks, if wound continues to be stalled may consider x-ray to evaluate for refractory osteomyelitis. 04/21/17;  this is a patient that I don't know all that well. She has a chronic wound which at one point had underlying osteomyelitis in the left ischial tuberosity. This is a stage IV pressure ulcer. Over the last 3 months she has a stage II wound inferiorly to the original wound. The last time she was here her dressing was changed to silver collagen although the patient's husband who changes the dressing said that the collagen stuck to the wound and remove skin from the superficial area therefore he switched back to [place] 05/13/17; this is a patient we've been following for a left ischial tuberosity wound which was stage IV at one point had underlying osteomyelitis. Over the last several months she's had a stage II wound just inferior and medial to the related to the wound. According to her husband he is using Endoform layer with collagen although this is not what I had last time. According to her husband they are using Elgie Congo with collagen although I don't quite know how that started. She was hospitalized from 1/20 through 04/30/16. This was related to a UTI. Her blood cultures were negative, urine culture showed multiple species. She did have a CT scan of the abdomen and pelvis which documented chronic osteomyelitis in the area of the wound inflammatory markers were unremarkable. She has had prior knowledge of osteomyelitis. It looks as though she received IV antibiotics in 2017 and was treated with a course of hyperbaric oxygen. 05/28/17; the wound over the left ischial tuberosity is deeper today and abuts clearly on bone. Nursing intake reported drainage. I therefore culture of the wound. The more superficial area just below this looks about the same. They once again complained that there are mattress cover is not working although apparently [facility] is been noted to see this many times in the report is that the device is functional 06/18/17; the patient had a probing area on the left  ischial tuberosity that was draining purulent fluid last time. This also clearly seemed to have open bone. Culture I did showed pansensitive pseudomonas including third generation cephalosporins. I treated this with cefdinir 300 twice a day for 10 days and things seem to have improved. She has a more superficial wound just underneath this area. Amazingly she has a new air mattress through [facility]. I think they gave this to her as a parking give. In any case this now works according to the patient may have something to do with why the  areas are looking better. 07/09/17; the patient has a probing area in the left ischial tuberosity that still has some depth. However this is contracted in terms of the wound orifice although the depth is still roughly the same. There is no undermining. ooShe also has the satellite wound which is more superficial. This appears to have a healthy surface we've been using silver collagen 08/06/17; the patient's wound is over the left ischial tuberosity and a satellite lesion just underneath this. The original wound was actually a deep stage 4 wound. We have made good progress in 2 months and there is no longer exposed bone here. 09/03/17; left ischial tuberosity actually appears to be quite healthy. I think we are making progress. No debridement is required. There is no surrounding erythema 10/01/17 I follow this patient monthly for her left ischial tuberosity wound. There is 2 areas the original area and a satellite area. The satellite area looks a lot better there is no surrounding erythema. Her husband relates that he is having trouble maintaining the dressing. This has to do with the soft tissue around it. He states he puts the collagen in but he cannot make sure that it stays in even with the ABD pads and tape that he is been using 10/29/17; patient arrives with a better looking noon today. Some of the satellite lesions have closed. using Prisma 11/26/17; the  patient has a large cone-shaped area with the tip of the Cone deep within her buttock soft tissue. The walls of the Cone are epithelialized however the base is still open. The area at the base of this looks moist we've been using silver collagen. Will change to silver alginate 12/31/2017; the wound appears to have come in fairly nicely. Using silver alginate. There is no surrounding maceration or infection 01/28/18; there is still an open area here over the left initial tuberosity. Base of this however looks healthy. There is no surrounding infection 02/25/18; the area of its open is over the left ischial tuberosity. The base of this is where the wound is. This is a large inverted cone-shaped area with the wound at the tip. Dimensions of the wound at the tip are improved. There is a area of denuded skin about halfway towards the tip which her husband thinks may have happened today when he was bathing her. 04/20/17; the area is still open over the left initial tuberosity. 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 This is now completely closed over although it is closed over with a divot and skin over bone at the base of this. Continued aggressive offloading will be necessary. 12/30/2018 READMISSION This is a 68 year old woman with chronic paraplegia. I picked her up for her care from Dr. Con Memos in this clinic after he departed. She had a stage IV pressure wound over the left ischial tuberosity. She was treated twice for her underlying osteomyelitis and this I believe firstly in 2016 and again in 2017. There were some plans at some point for flap closure of this however she was discovered to have uncontrolled diabetes and I do not think this was ever accomplished. She ultimately healed over in this clinic  and was discharged in May. She has a large cone-shaped indentation with the tip of this going towards the left ischial tuberosity. It is  not an easy area to examine but at that time I thought all of this was epithelialized. Apparently there was a reopening here shortly after she left the clinic last time. She was admitted to hospital at the end of June for Klebsiella bacteremia felt to be secondary to UTI. A CT scan of the pelvis is listed below and there was initially some concern that she had underlying osteomyelitis although I believe she was seen by infectious disease and that was felt to be not the case: I do not see any new cultures or inflammatory markers IMPRESSION: 1. No CT evidence for acute intra-abdominal or pelvic abnormality. Large volume of stool throughout the colon. 2. Enlarged fatty liver with fat sparing near the gallbladder fossa 3. Cortical scarring right kidney. Bilateral intrarenal stones without hydronephrosis. Thick-walled urinary bladder decompressed by suprapubic catheter 4. Deep left decubitus ulcer with underlying left ischial changes suggesting osteomyelitis. Her husband has been using silver collagen in the wound. She has not been systemically unwell no fever chills eating and drinking well. They rigorously offload this wound only getting up in the wheelchair when she is going to appointments the rest of the time she is in bed. 10/8; wound measures larger and she now has exposed bone. We have been using silver alginate 11/12 still using silver alginate. The patient saw Dr. Megan Salon of infectious disease. She was started on Augmentin 500 mg twice daily. She is due to follow-up with Dr. Megan Salon I believe next week. Lab work Dr. Megan Salon requested showed a sedimentation rate of 28 and CRP of 20 although her CRP 1 year ago was 18.8. Sedimentation rate 1 year ago was 11 basic metabolic panel showed a creatinine of 1.12 12/3; the patient followed up with Dr. Megan Salon  yesterday. She is still on Augmentin twice daily. This was directed by Dr. Megan Salon. The patient's inflammatory markers have improved which is gratifying. Her C-reactive protein was repeated yesterday and follow-up booked with infectious disease in January. In addition I have been getting secure text messages I think from palliative care through [facility] [employer]. I think they were hoping to provide services to the patient in her home. They could not get a hold of the primary physician and so they reached out to me on 2 separate occasions. Objective Constitutional Sitting or standing Blood Pressure is within target range for patient.. Pulse regular and within target range for patient.Marland Kitchen Respirations regular, non-labored and within target range.. Temperature is normal and within the target range for the patient.Marland Kitchen Appears in no distress. Vitals Time Taken: 11:38 AM, Height: 63 in, Weight: 185 lbs, BMI: 32.8, Temperature: 97.5 F, Pulse: 79 bpm, Respiratory Rate: 18 breaths/min, Blood Pressure: 112/64 mmHg. Eyes Conjunctivae clear. No discharge.no icterus. Respiratory work of breathing is normal. Cardiovascular Patient appears well-hydrated. Gastrointestinal (GI) Abdomen is soft and non-distended without masses or tenderness.Marland Kitchen Psychiatric appears at normal baseline. General Notes: Wound exam ooLeft ischial tuberosity. This still goes down to bone at the base. There is tenderness here. The walls of the cone-shaped wound are still covered in necrotic debris. This is going to require debridement before any attempt to see if we can get some granulation. Integumentary (Hair, Skin) Wound #12 status is Open. Original cause of wound was Pressure Injury. The wound is located on the Left Ischial Tuberosity. The wound measures 1.7cm length x 1.5cm width x 2.8cm depth; 2.003cm^2 area and 5.608cm^3 volume. There is Fat Layer (Subcutaneous Tissue) Exposed exposed. There is no  tunneling or  undermining noted. There is a medium amount of purulent drainage noted. The wound margin is well defined and not attached to the wound base. There is large (67-100%) pink granulation within the wound bed. There is a small (1-33%) amount of necrotic tissue within the wound bed including Adherent Slough. Assessment Active Problems ICD-10 Pressure ulcer of left buttock, stage 4 Paraplegia, unspecified Type 2 diabetes mellitus with other skin ulcer Other chronic osteomyelitis, other site Procedures Wound #12 Pre-procedure diagnosis of Wound #12 is a Pressure Ulcer located on the Left Ischial Tuberosity . There was a Chemical/Enzymatic/Mechanical debridement performed by Deon Pilling, RN. With the following instrument(s): tongue blade. Agent used was [employer]. A time out was conducted at 12:40, prior to the start of the procedure. There was no bleeding. The procedure was tolerated well with a pain level of 0 throughout and a pain level of 0 following the procedure. Post Debridement Measurements: 1.7cm length x 1.5cm width x 2.8cm depth; 5.608cm^3 volume. Post debridement Stage noted as Category/Stage IV. Character of Wound/Ulcer Post Debridement requires further debridement. Post procedure Diagnosis Wound #12: Same as Pre-Procedure Plan Follow-up Appointments: Return Appointment in 2 weeks. Dressing Change Frequency: Change dressing every day. Skin Barriers/Peri-Wound Care: Skin Prep - to periwound. Barrier cream - as needed for maceration, wetness, or redness to periwound. Wound Cleansing: Wound #12 Left Ischial Tuberosity: May shower and wash wound with soap and water. - or wound cleanser Primary Wound Dressing: Wound #12 Left Ischial Tuberosity: Santyl Ointment - apply to wound bed. apply saline moisten gauze over Santyl. Secondary Dressing: Wound #12 Left Ischial Tuberosity: ABD pad - or bordered foam. Off-Loading: Low air-loss mattress (Group 2) - continue to  use Gel wheelchair cushion - continue to use specialty wheelchair cushion. Turn and reposition every 2 hours Additional Orders / Instructions: Other: - patient to call wound center if insurance does not cover santyl and cannot coupon. The following medication(s) was prescribed: Santyl topical 250 unit/gram ointment ointment topical to wounds change daily for pressure ulcer left buttock starting 03/10/2019 1. Left ischial tuberosity; I am going to use Santyl wet to dry hopefully for the next 2 to 4 weeks to see if we can clean up the walls of this wound. I elected not to attempt mechanical debridement today. 2. Underlying osteomyelitis still on Augmentin with improvement in the inflammatory markers. C-reactive protein was done yesterday by Dr. Megan Salon. 3. She is eating and drinking well per description of the patient and her husband 4. Palliative care through [facility] it reach out to me with secure text messages. I tried to talk to the patient and her husband about this. They were indignant about any suggestion that she was close to the end of life. I do not think this is something that we need to pursue. If I get a follow-up from [facility] all relate that the patient was not interested Electronic Signature(s) Signed: 03/11/2019 7:57:15 AM By: Linton Ham MD Entered By: Linton Ham on 03/10/2019 13:28:14 -------------------------------------------------------------------------------- SuperBill Details Patient Name: Date of Service: [name], [name] 03/10/2019 Medical Record Number:[id number] Patient Account Number: [id number] Date of Birth/Sex: Treating RN: Dec 24, 1950 (68 y.o. [name], [name] Primary Care Provider: [name] Other Clinician: Referring Provider: Treating Provider/Extender:Conlin Brahm, [name], [name] in Treatment: 10 Diagnosis Coding ICD-10 Codes Code Description [phone] Pressure ulcer of left buttock, stage 4 G82.20 Paraplegia,  unspecified E11.622 Type 2 diabetes mellitus with other skin ulcer M86.68 Other chronic osteomyelitis, other site Facility Procedures The patient participates with Medicare  or their insurance follows the Medicare Facility Guidelines: CPT4 Code Description Modifier Quantity [phone] - DEBRIDE W/O ANES NON SELECT 1 Physician Procedures CPT4 Code: [id number] Description: 57846 - WC PHYS LEVEL 4 - EST PT ICD-10 Diagnosis Description 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, stage 4 G82.20 Paraplegia, unspecified Modifier: Quantity: 1 Electronic Signature(s) Signed: 03/11/2019 7:57:15 AM By: Linton Ham MD Entered By: Linton Ham on 03/10/2019 13:28:35

## 2019-03-15 DIAGNOSIS — G894 Chronic pain syndrome: Secondary | ICD-10-CM | POA: Diagnosis not present

## 2019-03-15 DIAGNOSIS — F329 Major depressive disorder, single episode, unspecified: Secondary | ICD-10-CM | POA: Diagnosis not present

## 2019-03-15 DIAGNOSIS — M47816 Spondylosis without myelopathy or radiculopathy, lumbar region: Secondary | ICD-10-CM | POA: Diagnosis not present

## 2019-03-15 DIAGNOSIS — M4712 Other spondylosis with myelopathy, cervical region: Secondary | ICD-10-CM | POA: Diagnosis not present

## 2019-03-15 NOTE — Progress Notes (Signed)
[name], [name] ([id number]) Visit Report for 02/17/2019 Arrival Information Details Patient Name: Date of Service: [name], [name] 02/17/2019 11:00 AM Medical Record Number:[id number] Patient Account Number: [id number] Date of Birth/Sex: Treating RN: 01/19/1951 (68 y.o. [name], [name] Primary Care Halbert Jesson: [name] Other Clinician: Referring Tucker Minter: Treating Tellis Spivak/Extender:[name], [name], [name] in Treatment: 7 Visit Information History Since Last Visit Added or deleted any medications: No Patient Arrived: Wheel Chair Any new allergies or adverse reactions: No Arrival Time: 11:06 Had a fall or experienced change in No activities of daily living that may affect Accompanied By: husband risk of falls: Transfer Assistance: Manual Signs or symptoms of abuse/neglect since last No Patient Identification Verified: Yes visito Secondary Verification Process Completed: Yes Hospitalized since last visit: No Patient Requires Transmission-Based No Implantable device outside of the clinic excluding No Precautions: cellular tissue based products placed in the center Patient Has Alerts: No since last visit: Has Dressing in Place as Prescribed: Yes Pain Present Now: No Electronic Signature(s) Signed: 03/15/2019 3:00:22 PM By: Sandre Kitty Entered By: Sandre Kitty on 02/17/2019 11:10:32 -------------------------------------------------------------------------------- Clinic Level of Care Assessment Details Patient Name: Date of Service: AIDELIZ, GARMANY 02/17/2019 11:00 AM Medical Record Number:[id number] Patient Account Number: [id number] Date of Birth/Sex: Treating RN: [birth year] (68 y.o. [name], [name] Primary Care Tyric Rodeheaver: [name] Other Clinician: Referring Rebeccah Ivins: Treating Peytin Dechert/Extender:[name], [name], [name] in Treatment: 7 Clinic Level of Care Assessment Items TOOL 4 Quantity Score X - Use when only an EandM is performed on FOLLOW-UP  visit 1 0 ASSESSMENTS - Nursing Assessment / Reassessment X - Reassessment of Co-morbidities (includes updates in patient status) 1 10 X - Reassessment of Adherence to Treatment Plan 1 5 ASSESSMENTS - Wound and Skin Assessment / Reassessment '[]'$  - Simple Wound Assessment / Reassessment - one wound 0 X - Complex Wound Assessment / Reassessment - multiple wounds 1 5 X - Dermatologic / Skin Assessment (not related to wound area) 1 10 ASSESSMENTS - Focused Assessment '[]'$  - Circumferential Edema Measurements - multi extremities 0 X - Nutritional Assessment / Counseling / Intervention 1 10 '[]'$  - Lower Extremity Assessment (monofilament, tuning fork, pulses) 0 '[]'$  - Peripheral Arterial Disease Assessment (using hand held doppler) 0 ASSESSMENTS - Ostomy and/or Continence Assessment and Care '[]'$  - Incontinence Assessment and Management 0 '[]'$  - Ostomy Care Assessment and Management (repouching, etc.) 0 PROCESS - Coordination of Care '[]'$  - Simple Patient / Family Education for ongoing care 0 X - Complex (extensive) Patient / Family Education for ongoing care 1 20 X - Staff obtains Programmer, systems, Records, Test Results / Process Orders 1 10 '[]'$  - Staff telephones HHA, Nursing Homes / Clarify orders / etc 0 '[]'$  - Routine Transfer to another Facility (non-emergent condition) 0 '[]'$  - Routine Hospital Admission (non-emergent condition) 0 '[]'$  - New Admissions / Biomedical engineer / Ordering NPWT, Apligraf, etc. 0 '[]'$  - Emergency Hospital Admission (emergent condition) 0 '[]'$  - Simple Discharge Coordination 0 X - Complex (extensive) Discharge Coordination 1 15 PROCESS - Special Needs '[]'$  - Pediatric / Minor Patient Management 0 '[]'$  - Isolation Patient Management 0 '[]'$  - Hearing / Language / Visual special needs 0 '[]'$  - Assessment of Community assistance (transportation, D/C planning, etc.) 0 '[]'$  - Additional assistance / Altered mentation 0 '[]'$  - Support Surface(s) Assessment (bed, cushion, seat, etc.) 0 INTERVENTIONS -  Wound Cleansing / Measurement '[]'$  - Simple Wound Cleansing - one wound 0 X - Complex Wound Cleansing - multiple wounds 1 5 X - Wound Imaging (photographs -  any number of wounds) 1 5 '[]'$  - Wound Tracing (instead of photographs) 0 '[]'$  - Simple Wound Measurement - one wound 0 X - Complex Wound Measurement - multiple wounds 1 5 INTERVENTIONS - Wound Dressings '[]'$  - Small Wound Dressing one or multiple wounds 0 X - Medium Wound Dressing one or multiple wounds 1 15 '[]'$  - Large Wound Dressing one or multiple wounds 0 '[]'$  - Application of Medications - topical 0 '[]'$  - Application of Medications - injection 0 INTERVENTIONS - Miscellaneous '[]'$  - External ear exam 0 '[]'$  - Specimen Collection (cultures, biopsies, blood, body fluids, etc.) 0 '[]'$  - Specimen(s) / Culture(s) sent or taken to Lab for analysis 0 '[]'$  - Patient Transfer (multiple staff / Civil Service fast streamer / Similar devices) 0 '[]'$  - Simple Staple / Suture removal (25 or less) 0 '[]'$  - Complex Staple / Suture removal (26 or more) 0 '[]'$  - Hypo / Hyperglycemic Management (close monitor of Blood Glucose) 0 '[]'$  - Ankle / Brachial Index (ABI) - do not check if billed separately 0 X - Vital Signs 1 5 Has the patient been seen at the hospital within the last three years: Yes Total Score: 120 Level Of Care: New/Established - Level 4 Electronic Signature(s) Signed: 02/17/2019 5:17:30 PM By: Deon Pilling Entered By: Deon Pilling on 02/17/2019 11:40:32 -------------------------------------------------------------------------------- Encounter Discharge Information Details Patient Name: Date of Service: [name], [name] 02/17/2019 11:00 AM Medical Record [id number] Patient Account Number: [id number] Date of Birth/Sex: Treating RN: 12/08/1950 (68 y.o. [name] Primary Care Annalena Piatt: [name] Other Clinician: Referring Kyden Potash: Treating Sherri Mcarthy/Extender:[name], [name], [name] in Treatment: 7 Encounter Discharge Information Items Discharge  Condition: Stable Ambulatory Status: Wheelchair Discharge Destination: Home Transportation: Private Auto Accompanied By: spouse Schedule Follow-up Appointment: Yes Clinical Summary of Care: Patient Declined Electronic Signature(s) Signed: 02/17/2019 5:16:39 PM By: Baruch Gouty RN, BSN Entered By: Baruch Gouty on 02/17/2019 11:54:14 -------------------------------------------------------------------------------- Multi Wound Chart Details Patient Name: Date of Service: [name], [name] 02/17/2019 11:00 AM Medical Record [id number] Patient Account Number: [id number] Date of Birth/Sex: Treating RN: 1950/04/26 (68 y.o. [name], Meta.Reding Primary Care Elbia Paro: [name] Other Clinician: Referring Oriah Leinweber: Treating Caylan Schifano/Extender:[name], [name], [name] in Treatment: 7 Vital Signs Height(in): 18 Pulse(bpm): 59 Weight(lbs): 185 Blood Pressure(mmHg): 152/65 Body Mass Index(BMI): 33 Temperature(F): 98.0 Respiratory 17 Rate(breaths/min): Photos: [12:No Photos] [N/A:N/A] Wound Location: [12:Left Ischial Tuberosity] [N/A:N/A] Wounding Event: [12:Pressure Injury] [N/A:N/A] Primary Etiology: [12:Pressure Ulcer] [N/A:N/A] Comorbid History: [12:Anemia, Hypertension, TypeN/A II Diabetes, Osteoarthritis, Dementia, Paraplegia, Received Radiation] Date Acquired: [12:09/06/2018] [N/A:N/A] Weeks of Treatment: [12:7] [N/A:N/A] Wound Status: [12:Open] [N/A:N/A] Measurements L x W x D [12:2x1.5x2.1] [N/A:N/A] (cm) Area (cm) : [12:2.356] [N/A:N/A] Volume (cm) : [12:4.948] [N/A:N/A] % Reduction in Area: [12:-898.30%] [N/A:N/A] % Reduction in Volume: [12:-2234.00%] [N/A:N/A] Classification: [12:Category/Stage IV] [N/A:N/A] Exudate Amount: [12:Medium] [N/A:N/A] Exudate Type: [12:Serosanguineous] [N/A:N/A] Exudate Color: [12:red, brown] [N/A:N/A] Wound Margin: [12:Well defined, not attached] [N/A:N/A] Granulation Amount: [12:Large (67-100%)] [N/A:N/A] Granulation Quality:  [12:Pink] [N/A:N/A] Necrotic Amount: [12:Small (1-33%)] [N/A:N/A] Exposed Structures: [12:Fat Layer (Subcutaneous Tissue) Exposed: Yes Fascia: No Tendon: No Muscle: No Joint: No Bone: No None] [N/A:N/A N/A] Treatment Notes Wound #12 (Left Ischial Tuberosity) 2. Periwound Care Skin Prep 3. Primary Dressing Applied Calcium Alginate Ag 4. Secondary Dressing Dry Gauze Foam Border Dressing Electronic Signature(s) Signed: 02/17/2019 5:17:30 PM By: Deon Pilling Signed: 02/17/2019 5:47:05 PM By: Linton Ham MD Entered By: Linton Ham on 02/17/2019 12:37:23 -------------------------------------------------------------------------------- Multi-Disciplinary Care Plan Details Patient Name: Date of Service: [name], [name] 02/17/2019 11:00 AM Medical Record [phone]  Patient Account Number: [id number] Date of Birth/Sex: Treating RN: May 22, 1950 (68 y.o. [name], [name] Primary Care Dariyon Urquilla: [name] Other Clinician: Referring Evlyn Amason: Treating Alyxander Kollmann/Extender:[name], [name], [name] in Treatment: 7 Active Inactive Abuse / Safety / Falls / Self Care Management Nursing Diagnoses: Impaired physical mobility Potential for falls Goals: Patient will remain injury free related to falls Date Initiated: 12/30/2018 Target Resolution Date: 04/01/2019 Goal Status: Active Patient/caregiver will verbalize/demonstrate measure taken to improve self care Target Resolution Date Initiated: 12/30/2018 Date Inactivated: 01/13/2019 Date: 01/27/2019 Goal Status: Met Interventions: Assess fall risk on admission and as needed Provide education on fall prevention Notes: Wound/Skin Impairment Nursing Diagnoses: Knowledge deficit related to ulceration/compromised skin integrity Goals: Patient/caregiver will verbalize understanding of skin care regimen Date Initiated: 12/30/2018 Target Resolution Date: 03/24/2019 Goal Status: Active Interventions: Assess patient/caregiver  ability to obtain necessary supplies Assess patient/caregiver ability to perform ulcer/skin care regimen upon admission and as needed Assess ulceration(s) every visit Provide education on ulcer and skin care Treatment Activities: Skin care regimen initiated : 12/30/2018 Topical wound management initiated : 12/30/2018 Notes: Electronic Signature(s) Signed: 02/17/2019 5:17:30 PM By: Deon Pilling Entered By: Deon Pilling on 02/17/2019 11:09:30 -------------------------------------------------------------------------------- Pain Assessment Details Patient Name: Date of Service: [name], [name] 02/17/2019 11:00 AM Medical Record [id number] Patient Account Number: [id number] Date of Birth/Sex: Treating RN: March 04, 1951 (68 y.o. [name], [name] Primary Care Sosaia Pittinger: [name] Other Clinician: Referring Gaylon Bentz: Treating Ayat Drenning/Extender:[name], [name], [name] in Treatment: 7 Active Problems Location of Pain Severity and Description of Pain Patient Has Paino No Site Locations Pain Management and Medication Current Pain Management: Electronic Signature(s) Signed: 02/17/2019 5:17:30 PM By: Deon Pilling Signed: 03/15/2019 3:00:22 PM By: Sandre Kitty Entered By: Sandre Kitty on 02/17/2019 11:11:41 -------------------------------------------------------------------------------- Patient/Caregiver Education Details Patient Name: Date of Service: [name] 11/12/2020andnbsp11:00 AM Medical Record Patient Account Number: [id number] Number: Treating RN: Deon Pilling Date of Birth/Gender: [birth year] (68 y.o. F) Other Clinician: Primary Care Physician: [name] Treating Linton Ham Referring Physician: Physician/Extender: Antonieta Iba in Treatment: 7 Education Assessment Education Provided To: Patient Education Topics Provided Wound/Skin Impairment: Handouts: Caring for Your Ulcer, Skin Care Do's and Dont's Methods: Explain/Verbal,  Printed Responses: Reinforcements needed Electronic Signature(s) Signed: 02/17/2019 5:17:30 PM By: Deon Pilling Entered By: Deon Pilling on 02/17/2019 11:09:45 -------------------------------------------------------------------------------- Wound Assessment Details Patient Name: Date of Service: [name], [name] 02/17/2019 11:00 AM Medical Record [id number] Patient Account Number: [id number] Date of Birth/Sex: Treating RN: January 10, 1951 (68 y.o. [name], [name] Primary Care Milon Dethloff: [name] Other Clinician: Referring Dontae Minerva: Treating Stanlee Roehrig/Extender:[name], [name], [name] in Treatment: 7 Wound Status Wound Number: 12 Primary Pressure Ulcer Etiology: Wound Location: Left Ischial Tuberosity Wound Open Wounding Event: Pressure Injury Status: Date Acquired: 09/06/2018 Comorbid Anemia, Hypertension, Type II Diabetes, Weeks Of Treatment: 7 History: Osteoarthritis, Dementia, Paraplegia, Received Clustered Wound: No Radiation Photos Wound Measurements Length: (cm) 2 % Reductio Width: (cm) 1.5 % Reductio Depth: (cm) 2.1 Epithelial Area: (cm) 2.356 Tunneling Volume: (cm) 4.948 Undermini Wound Description Classification: Category/Stage IV Wound Margin: Well defined, not attached Exudate Amount: Medium Exudate Type: Serosanguineous Exudate Color: red, brown Wound Bed Granulation Amount: Large (67-100%) Granulation Quality: Pink Necrotic Amount: Small (1-33%) Necrotic Quality: Adherent Slough Foul Odor After Cleansing: No Slough/Fibrino No Exposed Structure Fascia Exposed: No Fat Layer (Subcutaneous Tissue) Exposed: Yes Tendon Exposed: No Muscle Exposed: No Joint Exposed: No Bone Exposed: No n in Area: -898.3% n in Volume: -2234% ization: None : No ng: No Electronic Signature(s) Signed: 02/18/2019 4:02:07 PM By:  Mikeal Hawthorne EMT/HBOT Signed: 02/18/2019 5:02:36 PM By: Deon Pilling Previous Signature: 02/17/2019 5:17:30 PM Version By: Deon Pilling Entered By: Mikeal Hawthorne on 02/18/2019 08:46:10 -------------------------------------------------------------------------------- Vitals Details Patient Name: Date of Service: [name], [name] 02/17/2019 11:00 AM Medical Record [id number] Patient Account Number: [id number] Date of Birth/Sex: Treating RN: 1950-06-09 (68 y.o. [name], [name] Primary Care Marney Treloar: [name] Other Clinician: Referring Jennamarie Goings: Treating Rily Nickey/Extender:[name], [name], [name] in Treatment: 7 Vital Signs Time Taken: 11:10 Temperature (F): 98.0 Height (in): 63 Pulse (bpm): 92 Weight (lbs): 185 Respiratory Rate (breaths/min): 17 Body Mass Index (BMI): 32.8 Blood Pressure (mmHg): 152/65 Reference Range: 80 - 120 mg / dl Electronic Signature(s) Signed: 03/15/2019 3:00:22 PM By: Sandre Kitty Entered By: Sandre Kitty on 02/17/2019 11:11:33

## 2019-03-16 NOTE — Progress Notes (Signed)
[name], [name] ([id number]) Visit Report for 03/10/2019 Arrival Information Details Patient Name: Date of Service: [name], [name] 03/10/2019 10:30 AM Medical Record Number:[id number] Patient Account Number: [id number] Date of Birth/Sex: Treating RN: 12-Mar-1951 (68 y.o. [name] Primary Care Caidan Hubbert: [name] Other Clinician: Referring Kimber Fritts: Treating Minaal Struckman/Extender:[name], [name], [name] in Treatment: 10 Visit Information History Since Last Visit Added or deleted any medications: No Patient Arrived: Wheel Chair Any new allergies or adverse reactions: No Arrival Time: 11:37 Had a fall or experienced change in No activities of daily living that may affect Accompanied By: spouse risk of falls: Transfer Assistance: Other Signs or symptoms of abuse/neglect since last No Patient Identification Verified: Yes visito Secondary Verification Process Completed: Yes Hospitalized since last visit: No Patient Requires Transmission-Based No Implantable device outside of the clinic excluding No Precautions: cellular tissue based products placed in the center Patient Has Alerts: No since last visit: Has Dressing in Place as Prescribed: Yes Pain Present Now: No Electronic Signature(s) Signed: 03/16/2019 12:04:56 PM By: Kela Millin Entered By: Kela Millin on 03/10/2019 11:37:38 -------------------------------------------------------------------------------- Lower Extremity Assessment Details Patient Name: Date of Service: [name], [name] 03/10/2019 10:30 AM Medical Record [id number] Patient Account Number: [id number] Date of Birth/Sex: Treating RN: 1951-02-28 (68 y.o. [name] Primary Care Jarquavious Fentress: [name] Other Clinician: Referring Terrius Gentile: Treating Calleigh Lafontant/Extender:[name], [name], [name] in Treatment: 10 Electronic Signature(s) Signed: 03/16/2019 12:04:56 PM By: Kela Millin Entered By: Kela Millin on  03/10/2019 11:41:48 -------------------------------------------------------------------------------- Multi Wound Chart Details Patient Name: Date of Service: [name], [name] 03/10/2019 10:30 AM Medical Record [id number] Patient Account Number: [id number] Date of Birth/Sex: Treating RN: 1950-04-27 (68 y.o. F) Primary Care Jamea Robicheaux: [name] Other Clinician: Referring Malvin Morrish: Treating Jasiya Markie/Extender:[name], [name], [name] in Treatment: 10 Vital Signs Height(in): 62 Pulse(bpm): 33 Weight(lbs): 185 Blood Pressure(mmHg): 112/64 Body Mass Index(BMI): 33 Temperature(F): 97.5 Respiratory 18 Rate(breaths/min): Photos: [12:No Photos] [N/A:N/A] Wound Location: [12:Left Ischial Tuberosity] [N/A:N/A] Wounding Event: [12:Pressure Injury] [N/A:N/A] Primary Etiology: [12:Pressure Ulcer] [N/A:N/A] Comorbid History: [12:Anemia, Hypertension, TypeN/A II Diabetes, Osteoarthritis, Dementia, Paraplegia, Received Radiation] Date Acquired: [12:09/06/2018] [N/A:N/A] Weeks of Treatment: [12:10] [N/A:N/A] Wound Status: [12:Open] [N/A:N/A] Measurements L x W x D 1.7x1.5x2.8 [N/A:N/A] (cm) Area (cm) : [12:2.003] [N/A:N/A] Volume (cm) : [12:5.608] [N/A:N/A] % Reduction in Area: [12:-748.70%] [N/A:N/A] % Reduction in Volume: -2545.30% [N/A:N/A] Classification: [12:Category/Stage IV] [N/A:N/A] Exudate Amount: [12:Medium] [N/A:N/A] Exudate Type: [12:Purulent] [N/A:N/A] Exudate Color: [12:yellow, brown, green] [N/A:N/A] Wound Margin: [12:Well defined, not attached N/A] Granulation Amount: [12:Large (67-100%)] [N/A:N/A] Granulation Quality: [12:Pink] [N/A:N/A] Necrotic Amount: [12:Small (1-33%)] [N/A:N/A] Exposed Structures: [12:Fat Layer (Subcutaneous N/A Tissue) Exposed: Yes Fascia: No Tendon: No Muscle: No Joint: No Bone: No] Epithelialization: [12:None] [N/A:N/A] Debridement: [12:Chemical/Enzymatic/MechanicalN/A] Pre-procedure [12:12:40] [N/A:N/A] Verification/Time  Out Taken: Instrument: [12:Other(tongue blade)] [N/A:N/A] Bleeding: [12:None] [N/A:N/A] Procedural Pain: [12:0] [N/A:N/A] Post Procedural Pain: [12:0] [N/A:N/A] Debridement Treatment [12:Procedure was tolerated] [N/A:N/A] Response: [12:well] Post Debridement [12:1.7x1.5x2.8] [N/A:N/A] Measurements L x W x D (cm) Post Debridement [12:5.608] [N/A:N/A] Volume: (cm) Post Debridement Stage: [12:Category/Stage IV Debridement] [N/A:N/A N/A] Treatment Notes Electronic Signature(s) Signed: 03/11/2019 7:57:15 AM By: Linton Ham MD Entered By: Linton Ham on 03/10/2019 13:22:49 -------------------------------------------------------------------------------- Multi-Disciplinary Care Plan Details Patient Name: Date of Service: [name], [name] 03/10/2019 10:30 AM Medical Record [id number] Patient Account Number: [id number] Date of Birth/Sex: Treating RN: 1950-11-10 (68 y.o. [name] Primary Care Stephanos Fan: [name] Other Clinician: Referring Debria Broecker: Treating [place] Hanlon/Extender:[name], [name], [name] in Treatment: 10 Active Inactive Abuse / Safety / Falls / Self Care Management Nursing Diagnoses: Impaired  physical mobility Potential for falls Goals: Patient will remain injury free related to falls Date Initiated: 12/30/2018 Target Resolution Date: 04/01/2019 Goal Status: Active Patient/caregiver will verbalize/demonstrate measure taken to improve self care Date Inactivated: 01/13/2019 Target10/22/2020 Resolution Date Initiated: 12/30/2018 Date: Goal Status: Met Interventions: Assess fall risk on admission and as needed Provide education on fall prevention Notes: Wound/Skin Impairment Nursing Diagnoses: Knowledge deficit related to ulceration/compromised skin integrity Goals: Patient/caregiver will verbalize understanding of skin care regimen Date Initiated: 12/30/2018 Target Resolution Date: 03/24/2019 Goal Status: Active Interventions: Assess  patient/caregiver ability to obtain necessary supplies Assess patient/caregiver ability to perform ulcer/skin care regimen upon admission and as needed Assess ulceration(s) every visit Provide education on ulcer and skin care Treatment Activities: Skin care regimen initiated : 12/30/2018 Topical wound management initiated : 12/30/2018 Notes: Electronic Signature(s) Signed: 03/10/2019 6:37:14 PM By: Deon Pilling Entered By: Deon Pilling on 03/10/2019 11:29:15 -------------------------------------------------------------------------------- Pain Assessment Details Patient Name: Date of Service: YADHIRA, MCKNEELY 03/10/2019 10:30 AM Medical Record [id number] Patient Account Number: [id number] Date of Birth/Sex: Treating RN: 1950-04-24 (67 y.o. [name] Primary Care Fynlee Rowlands: [name] Other Clinician: Referring Tamla Winkels: Treating Arilyn Brierley/Extender:[name], [name], [name] in Treatment: 10 Active Problems Location of Pain Severity and Description of Pain Patient Has Paino No Site Locations Pain Management and Medication Current Pain Management: Electronic Signature(s) Signed: 03/16/2019 12:04:56 PM By: Kela Millin Entered By: Kela Millin on 03/10/2019 11:41:41 -------------------------------------------------------------------------------- Patient/Caregiver Education Details Patient Name: Date of Service: Betha Loa 12/3/2020andnbsp10:30 AM Medical Record [id number] Patient Account Number: [id number] Date of Birth/Gender: Treating RN: [birth year] (68 y.o. [name], [name] Primary Care Physician: [name] Other Clinician: Referring Physician: Treating Physician/Extender:[name], [name], [name] in Treatment: 10 Education Assessment Education Provided To: Patient Education Topics Provided Safety: Handouts: Medication Safety Methods: Explain/Verbal Responses: Reinforcements needed Electronic Signature(s) Signed: 03/10/2019  6:37:14 PM By: Deon Pilling Entered By: Deon Pilling on 03/10/2019 11:29:26 -------------------------------------------------------------------------------- Wound Assessment Details Patient Name: Date of Service: SYRAI, GLADWIN 03/10/2019 10:30 AM Medical Record [id number] Patient Account Number: [id number] Date of Birth/Sex: Treating RN: 1950/10/25 (68 y.o. [name] Primary Care Barnes Florek: [name] Other Clinician: Referring Joya Willmott: Treating Jaye Polidori/Extender:[name], [name], [name] in Treatment: 10 Wound Status Wound Number: 12 Primary Pressure Ulcer Etiology: Wound Location: Left Ischial Tuberosity Wound Open Wounding Event: Pressure Injury Status: Date Acquired: 09/06/2018 Comorbid Anemia, Hypertension, Type II Diabetes, Weeks Of Treatment: 10 History: Osteoarthritis, Dementia, Paraplegia, Received Clustered Wound: No Radiation Wound Measurements Length: (cm) 1.7 Width: (cm) 1.5 Depth: (cm) 2.8 Area: (cm) 2.003 Volume: (cm) 5.608 Wound Description Classification: Category/Stage IV Wound Margin: Well defined, not attached Exudate Amount: Medium Exudate Type: Purulent Exudate Color: yellow, brown, green Wound Bed Granulation Amount: Large (67-100%) Granulation Quality: Pink Necrotic Amount: Small (1-33%) Necrotic Quality: Adherent Slough After Cleansing: No rino Yes Exposed Structure osed: No (Subcutaneous Tissue) Exposed: Yes osed: No osed: No sed: No ed: No % Reduction in Area: -748.7% % Reduction in Volume: -2545.3% Epithelialization: None Tunneling: No Undermining: No Foul Odor Slough/Fib Fascia Exp Fat Layer Tendon Exp Muscle Exp Joint Expo Bone Expos Electronic Signature(s) Signed: 03/16/2019 12:04:56 PM By: Kela Millin Entered By: Kela Millin on 03/10/2019 11:48:43 -------------------------------------------------------------------------------- Vitals Details Patient Name: Date of  Service: Betha Loa 03/10/2019 10:30 AM Medical Record [id number] Patient Account Number: [id number] Date of Birth/Sex: Treating RN: [birth year] (68 y.o. [name] Primary Care Ines Warf: [name] Other Clinician: Referring Tylar Merendino: Treating Sophea Rackham/Extender:[name], [name], [name] in Treatment: 10 Vital Signs Time Taken: 11:38 Temperature (  F): 97.5 Height (in): 63 Pulse (bpm): 79 Weight (lbs): 185 Respiratory Rate (breaths/min): 18 Body Mass Index (BMI): 32.8 Blood Pressure (mmHg): 112/64 Reference Range: 80 - 120 mg / dl Electronic Signature(s) Signed: 03/16/2019 12:04:56 PM By: Kela Millin Entered By: Kela Millin on 03/10/2019 11:41:35

## 2019-03-24 ENCOUNTER — Encounter (HOSPITAL_BASED_OUTPATIENT_CLINIC_OR_DEPARTMENT_OTHER): Payer: PPO | Admitting: Internal Medicine

## 2019-03-24 ENCOUNTER — Other Ambulatory Visit: Payer: Self-pay

## 2019-03-24 DIAGNOSIS — L89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, stage 4: Secondary | ICD-10-CM | POA: Diagnosis not present

## 2019-03-24 DIAGNOSIS — L97409 Non-pressure chronic ulcer of unspecified heel and midfoot with unspecified severity: Secondary | ICD-10-CM | POA: Diagnosis not present

## 2019-03-24 NOTE — Progress Notes (Signed)
JAHZARIA, VARY ([id number]) Visit Report for 03/24/2019 Debridement Details Patient Name: Date of Service: YARITZEL, STANGE 03/24/2019 9:45 AM Medical Record Number:[id number] Patient Account Number: [id number] Date of Birth/Sex: Treating RN: Nov 13, 1950 (68 y.o. F) Primary Care Provider: [name] Other Clinician: Referring Provider: Treating Provider/Extender:Norinne Jeane, [name], [name] in Treatment: 12 Debridement Performed for Wound #12 Left Ischial Tuberosity Assessment: Performed By: Physician Ricard Dillon., MD Debridement Type: Debridement Level of Consciousness (Pre- Awake and Alert procedure): Pre-procedure Yes - 10:58 Verification/Time Out Taken: Start Time: 10:59 Pain Control: Lidocaine 4% Topical Solution Total Area Debrided (L x W): 1 (cm) x 1 (cm) = 1 (cm) Tissue and other material Viable, Non-Viable, Slough, Subcutaneous, Fibrin/Exudate, Slough debrided: Level: Skin/Subcutaneous Tissue Debridement Description: Excisional Instrument: Curette Bleeding: Moderate Hemostasis Achieved: Pressure End Time: 11:04 Procedural Pain: 0 Post Procedural Pain: 3 Response to Treatment: Procedure was tolerated well Level of Consciousness Awake and Alert (Post-procedure): Post Debridement Measurements of Total Wound Length: (cm) 2 Stage: Category/Stage IV Width: (cm) 1.7 Depth: (cm) 2 Volume: (cm) 5.341 Character of Wound/Ulcer Post Improved Debridement: Post Procedure Diagnosis Same as Pre-procedure Electronic Signature(s) Signed: 03/24/2019 5:01:00 PM By: Linton Ham MD Entered By: Linton Ham on 03/24/2019 11:40:41 -------------------------------------------------------------------------------- HPI Details Patient Name: Date of Service: [name], [name] 03/24/2019 9:45 AM Medical Record [id number] Patient Account Number: [id number] Date of Birth/Sex: Treating RN: 1950/08/15 (68 y.o. F) Primary Care Provider: [name] Other  Clinician: Referring Provider: Treating Provider/Extender:Onell Mcmath, [name], [name] in Treatment: 12 History of Present Illness Location: open ulceration of the left gluteal area, left heel and right ankle for about 5 months. Quality: Patient reports No Pain. Severity: Patient states wound(s) are getting worse. Duration: Patient has had the wound for > 5 months prior to seeking treatment at the wound center Context: The wound occurred when the patient has been paraplegic for about 3 years. Modifying Factors: Wound improving due to current treatment. Associated Signs and Symptoms: Patient reports having foul odor. HPI Description: this 68 year old patient who is known to have hypertension, hypothyroidism, breast cancer, chronic pain syndrome, paraplegia was noted to have a left gluteal decubitus ulcer and was brought into the hospital. During the course of her hospitalization she was debrided in the operating room by Dr. Leland Johns and had all the wounds sharply debrided. The debridement was done for the left ischial wound, the left heel wound and the right ankle wound. Bone cultures were taken at that time but were negative but clinically she was treated for osteomyelitis because of the probing down to bone and open exposed bone. Home health has been giving her antibioticss which include vancomycin and Zosyn. The patient was a smoker until about 3 weeks ago and used to smoke about 10 cigarettes a day for a long while. 12/13/2014 - details of her operative note from 11/03/2014 were reviewed -- PROCEDURE: 1. Excisional debridement skin, subcutaneous, muscle left ischium 35 cm2 2. Excisional debridement skin, subcutaneous tissue left heel 27 cm2 3. Excisional debridement right ankle skin, subcutaneous, bone 30 cm2 01/24/2015 -- she has some issues with her wheelchair cushion but other than that is doing very well and has received Podus boots for her feet. 02/14/2015 -- she was using her old  offloading boots and this seemed to have caused her a new pressure ulcer on the left posterior heel near the superior part just below the Achilles tendon. 03/07/2015 -- she has a new ulceration just to the left of the midline on her sacral region more on the  left buttock and this has been there for about a week. 08/22/2015 -- was recently admitted to hospital between May 5 and 08/13/2015, with sepsis and leukocytosis due to a UTI. she was treated for a sepsis complicating Escherichia coli UTI and kidney stones. She also had metabolic and careful up at the secondary to pyelonephritis. He received broad-spectrum antibiotics initially and then received Macrobid as per urology. She was sent home on nitrofurantoin. during her admission she had a CT scan which showed exposed left ischial tuberosity without evidence of osteolysis. 09/12/2015-- the patient is having some issues with her air mattress and would like to get a opinion from medical modalities. 10/10/2015 -- the issue with her air mattress has not yet been sorted out and the new problem seems to be a lot of odor from the wound VAC. 11/27/2015 -- the patient was admitted to the hospital between July 23 and 10/31/2015. Her problems were sepsis, osteomyelitis of the pelvic bone and acute pyelonephritis. CT of the abdomen and pelvis was consistent with a left- sided pyelonephritis with hydronephrosis and also just showed new sclerosis of the posterior portion of the left anterior pubic ramus suggestive of periosteal reaction consistent with osteomyelitis. She was treated for the osteomyelitis with infectious disease consult recommending 6 weeks of IV antibiotics including vancomycin and Rocephin and the antibiotics were to go on until 12/10/2015. He was seen by Dr. Iran Planas plastic surgery and Dr. Linus Salmons of infectious disease. She had a suprapubic catheter placed during the admission. CT scan done on 10/28/2015 showed specifically -- New sclerosis  of the posterior portion of the left inferior pubic ramus with aggressive periosteal reaction, consistent with osteomyelitis, with adjacent soft tissue gas compatible with previously described decubitus ulcer. 12/12/2015 -- she was recently seen by Dr. Linus Salmons, who noted good improvement and CRP and ESR compared to before and he has stopped her antibiotic as per plans to finish on September 4. The patient was encouraged to continue with wound care and consider hyperbaric oxygen therapy. Today she tells me that she has consented to undergo hyperbaric oxygen therapy and we can start the paperwork. 01/02/2016 -- her PCP had gained about 3 years but she still persists in having problems during hyperbaric oxygen therapy with some discomfort in the ears. 01/09/16; pressure area with underlying osteomyelitis in the left buttock. Wound bed itself has some slight amount of grayish surface slough however I do not think any debridement was necessary. There is no exposed bone soft tissue appears stable. She is using a wound VAC 01/16/16; back for weekly wound review in conjunction with HBO. She has a deep wound over the left initial tuberosity previously treated with 6 weeks of IV antibiotics for osteomyelitis. Wound bed looks reasonably healthy although the base of this is still precariously close to bone. She has been using a wound VAC. 01/23/2016 -- she has completed her course of antibiotics and this week the only new thing is her right great toe nail was avulsed and she has got an open wound over the nailbed. 01/31/16 she has completed her course of antibiotics. Her right great toenail avulsed last week and she's been using silver alginate for this as well. Still using a wound VAC to the substantial stage IV wound over the left ischial tuberosity 03/05/2016 -- the patient has had a opinion from the plastic surgery group at [facility] and details of this are not available yet but the patient's verbal  report has been heard by me. Did not  sound like there was any optimistic discussion regarding reconstruction and the net result would be to continue with the wound VAC application. I will await the official reports. Addendum: -- she was seen at [facility] by Dr. Tressa Busman. After a thorough review and from what I understand spending 45 minutes with the patient his assessment has been noted by me in detail and the management options were: 1. Continued pressure offloading and wound care versus operative procedures including wound excision 2. Soft tissue and bone sampling 3. If the wound gets larger wound closure would be done using a variety of plastic surgical techniques including but not limited to skin substitute, possible skin graft, local versus regional flaps, negative pressure dressing application. 4. He discussed with her details of flap surgery and the risks associated 5. He made a comment that since the patient was operated on by Dr. Leland Johns of [facility] plastic surgery unit in [place] the patient may continue to follow-up there for further evaluation for surgical flap closure in the future. 03/19/2016 -- the patient continues to be rather depressed and frustrated with her lack of rapid progress in healing this wound especially because she thought after hyperbaric oxygen therapy the wound would heal extremely fast. She now understands that was not the implied benefit on wound care which was the recommendation for hyperbaric oxygen therapy. I have had a lengthy discussion with the patient and her husband regarding her options: 1. Continue with collagen and wound VAC for the primary dressing and offloading and all supportive care. 2. See Dr. Iran Planas for possible placement of Acell or Integra in the OR. 3. get a second opinion from a wound care center and surrounding regions/counties 05/07/2016 -- Note from Dr. Celedonio Miyamoto,  who noted that the patient has declined flap surgery. She has discussed application of A cell, and try a few applications to see how the wound progresses. She is also recommended that we could apply products here in the wound center, like Oasis. during her preop workup it was found that her hemoglobin A1c was 11% and she has now been diagnosed as having diabetes mellitus and has been put on appropriate treatment by her PCP 05/28/2016 -- tells me her blood sugars have been doing well and she has an appointment to see her PCP in the next couple of weeks to check her hemoglobin A1c. Other than that she continues to do well. 06/25/2016 -- have not seen her back for the last month but she says her health has been about the same and she has an appointment to check the A1c next week 09/10/16 ---- was seen by Dr. Celedonio Miyamoto -- who applied Acell and saw her back in follow-up. She has recommended silver alginate to the wound every other day and cover with foam. If no significant drainage could transition to collagen every other day. She recommended discontinuing wound VAC. There were no plans to repeat application of Acell. The patient expressed that her husband could do the wound care as going to the Wound Ctr., would cost several $100 for each visit. 10/21/2016 -- her insurance company is getting her new mattress and she is pleased about that. Other than that she has been doing dressings with PolyMem Silver and has been doing very well 02/18/2017 -- she has gone through several changes of her mattress and has not been pleased with any of them. The ventricles are still working on trying to fit her with the appropriate  low air-loss mattress. She has a new wound on the gluteal area which is clearly separated from the original wound. 03/25/17-she is here in follow-up evaluation for her left ischialpressure ulcer. She remains unsatisfied with her pressure mattress. She admits to sitting multiple hours a  day, in the bed. We have discussed offloading options. The wound does not appear infected. Nutrition does not appear to be a concern. Will follow-up in 4 weeks, if wound continues to be stalled may consider x-ray to evaluate for refractory osteomyelitis. 04/21/17; this is a patient that I don't know all that well. She has a chronic wound which at one point had underlying osteomyelitis in the left ischial tuberosity. This is a stage IV pressure ulcer. Over the last 3 months she has a stage II wound inferiorly to the original wound. The last time she was here her dressing was changed to silver collagen although the patient's husband who changes the dressing said that the collagen stuck to the wound and remove skin from the superficial area therefore he switched back to [place] 05/13/17; this is a patient we've been following for a left ischial tuberosity wound which was stage IV at one point had underlying osteomyelitis. Over the last several months she's had a stage II wound just inferior and medial to the related to the wound. According to her husband he is using Endoform layer with collagen although this is not what I had last time. According to her husband they are using Elgie Congo with collagen although I don't quite know how that started. She was hospitalized from 1/20 through 04/30/16. This was related to a UTI. Her blood cultures were negative, urine culture showed multiple species. She did have a CT scan of the abdomen and pelvis which documented chronic osteomyelitis in the area of the wound inflammatory markers were unremarkable. She has had prior knowledge of osteomyelitis. It looks as though she received IV antibiotics in 2017 and was treated with a course of hyperbaric oxygen. 05/28/17; the wound over the left ischial tuberosity is deeper today and abuts clearly on bone. Nursing intake reported drainage. I therefore culture of the wound. The more superficial area just below this  looks about the same. They once again complained that there are mattress cover is not working although apparently [facility] is been noted to see this many times in the report is that the device is functional 06/18/17; the patient had a probing area on the left ischial tuberosity that was draining purulent fluid last time. This also clearly seemed to have open bone. Culture I did showed pansensitive pseudomonas including third generation cephalosporins. I treated this with cefdinir 300 twice a day for 10 days and things seem to have improved. She has a more superficial wound just underneath this area. Amazingly she has a new air mattress through [facility]. I think they gave this to her as a parking give. In any case this now works according to the patient may have something to do with why the areas are looking better. 07/09/17; the patient has a probing area in the left ischial tuberosity that still has some depth. However this is contracted in terms of the wound orifice although the depth is still roughly the same. There is no undermining. She also has the satellite wound which is more superficial. This appears to have a healthy surface we've been using silver collagen 08/06/17; the patient's wound is over the left ischial tuberosity and a satellite lesion just underneath this. The  original wound was actually a deep stage 4 wound. We have made good progress in 2 months and there is no longer exposed bone here. 09/03/17; left ischial tuberosity actually appears to be quite healthy. I think we are making progress. No debridement is required. There is no surrounding erythema 10/01/17 I follow this patient monthly for her left ischial tuberosity wound. There is 2 areas the original area and a satellite area. The satellite area looks a lot better there is no surrounding erythema. Her husband relates that he is having trouble maintaining the dressing. This has to do with the soft tissue  around it. He states he puts the collagen in but he cannot make sure that it stays in even with the ABD pads and tape that he is been using 10/29/17; patient arrives with a better looking noon today. Some of the satellite lesions have closed. using Prisma 11/26/17; the patient has a large cone-shaped area with the tip of the Cone deep within her buttock soft tissue. The walls of the Cone are epithelialized however the base is still open. The area at the base of this looks moist we've been using silver collagen. Will change to silver alginate 12/31/2017; the wound appears to have come in fairly nicely. Using silver alginate. There is no surrounding maceration or infection 01/28/18; there is still an open area here over the left initial tuberosity. Base of this however looks healthy. There is no surrounding infection 02/25/18; the area of its open is over the left ischial tuberosity. The base of this is where the wound is. This is a large inverted cone-shaped area with the wound at the tip. Dimensions of the wound at the tip are improved. There is a area of denuded skin about halfway towards the tip which her husband thinks may have happened today when he was bathing her. 04/20/17; the area is still open over the left initial tuberosity. 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 This is now completely closed over although it is closed over with a divot and skin over bone at the base of this. Continued aggressive offloading will be necessary. 12/30/2018 READMISSION This is a 68 year old woman with chronic paraplegia. I picked her up for her care from Dr. Con Memos in this clinic after he departed. She had a stage IV pressure wound over the left ischial tuberosity. She was treated twice for her underlying  osteomyelitis and this I believe firstly in 2016 and again in 2017. There were some plans at some point for flap closure of this however she was discovered to have uncontrolled diabetes and I do not think this was ever accomplished. She ultimately healed over in this clinic and was discharged in May. She has a large cone-shaped indentation with the tip of this going towards the left ischial tuberosity. It is not an easy area to examine but at that time I thought all of this was epithelialized. Apparently there was a reopening here shortly after she left the clinic last time. She was admitted to hospital at the end of June for Klebsiella bacteremia felt to be secondary to UTI. A CT scan of the pelvis is listed below and there was initially some concern that she had underlying osteomyelitis although I believe she was seen by infectious disease and that was felt  to be not the case: I do not see any new cultures or inflammatory markers IMPRESSION: 1. No CT evidence for acute intra-abdominal or pelvic abnormality. Large volume of stool throughout the colon. 2. Enlarged fatty liver with fat sparing near the gallbladder fossa 3. Cortical scarring right kidney. Bilateral intrarenal stones without hydronephrosis. Thick-walled urinary bladder decompressed by suprapubic catheter 4. Deep left decubitus ulcer with underlying left ischial changes suggesting osteomyelitis. Her husband has been using silver collagen in the wound. She has not been systemically unwell no fever chills eating and drinking well. They rigorously offload this wound only getting up in the wheelchair when she is going to appointments the rest of the time she is in bed. 10/8; wound measures larger and she now has exposed bone. We have been using silver alginate 11/12 still using silver alginate. The patient saw Dr. Megan Salon of infectious disease. She was started on Augmentin 500 mg twice daily. She is due to follow-up with Dr. Megan Salon  I believe next week. Lab work Dr. Megan Salon requested showed a sedimentation rate of 28 and CRP of 20 although her CRP 1 year ago was 18.8. Sedimentation rate 1 year ago was 11 basic metabolic panel showed a creatinine of 1.12 12/3; the patient followed up with Dr. Megan Salon yesterday. She is still on Augmentin twice daily. This was directed by Dr. Megan Salon. The patient's inflammatory markers have improved which is gratifying. Her C-reactive protein was repeated yesterday and follow-up booked with infectious disease in January. In addition I have been getting secure text messages I think from palliative care through [facility] [employer]. I think they were hoping to provide services to the patient in her home. They could not get a hold of the primary physician and so they reached out to me on 2 separate occasions. 12/17; patient last saw Dr. Megan Salon on 12/2. She is finishing up with Augmentin. Her C-reactive protein was 20 on 10/21, 10.1 on 11/19 and 17 on 12/2. The wound itself still has depth and undermining. We are using Santyl with the backing wet-to-dry Electronic Signature(s) Signed: 03/24/2019 5:01:00 PM By: Linton Ham MD Entered By: Linton Ham on 03/24/2019 11:41:35 -------------------------------------------------------------------------------- Physical Exam Details Patient Name: Date of Service: [name], [name] 03/24/2019 9:45 AM Medical Record [id number] Patient Account Number: [id number] Date of Birth/Sex: Treating RN: 04-18-1950 (68 y.o. F) Primary Care Provider: [name] Other Clinician: Referring Provider: Treating Provider/Extender:Lili Harts, [name], [name] in Treatment: 12 Constitutional Patient is hypertensive.. Pulse regular and within target range for patient.Marland Kitchen Respirations regular, non-labored and within target range.. Temperature is normal and within the target range for the patient.Marland Kitchen Appears in no distress. Notes Wound exam;  left ischial tuberosity. Necrotic debris at the base of this removed with a #5 curette hemostasis with direct pressure. I think the base of this is more extensive than I remember with considerable undermining towards the midline. There is no surrounding infection Electronic Signature(s) Signed: 03/24/2019 5:01:00 PM By: Linton Ham MD Entered By: Linton Ham on 03/24/2019 11:43:15 -------------------------------------------------------------------------------- Physician Orders Details Patient Name: Date of Service: RIA, REDCAY 03/24/2019 9:45 AM Medical Record [id number] Patient Account Number: [id number] Date of Birth/Sex: Treating RN: [birth year] (68 y.o. [name], [name] Primary Care Provider: [name] Other Clinician: Referring Provider: Treating Provider/Extender:Olanrewaju Osborn, [name], [name] in Treatment: 12 Verbal / Phone Orders: No Diagnosis Coding ICD-10 Coding Code Description 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, stage 4 G82.20 Paraplegia, unspecified E11.622 Type 2 diabetes mellitus with other skin ulcer M86.68  Other chronic osteomyelitis, other site Follow-up Appointments Return appointment in 3 weeks. Dressing Change Frequency Change dressing every day. Skin Barriers/Peri-Wound Care Skin Prep - to periwound. Barrier cream - as needed for maceration, wetness, or redness to periwound. Wound Cleansing Wound #12 Left Ischial Tuberosity May shower and wash wound with soap and water. - or wound cleanser Primary Wound Dressing Wound #12 Left Ischial Tuberosity Santyl Ointment - apply to wound bed. apply saline moisten gauze over Santyl. Secondary Dressing Wound #12 Left Ischial Tuberosity ABD pad - or bordered foam. Off-Loading Low air-loss mattress (Group 2) - continue to use Gel wheelchair cushion - continue to use specialty wheelchair cushion. Turn and reposition every 2 hours Electronic Signature(s) Signed: 03/24/2019 5:01:00 PM By:  Linton Ham MD Signed: 03/24/2019 5:34:18 PM By: Deon Pilling Entered By: Deon Pilling on 03/24/2019 11:04:52 -------------------------------------------------------------------------------- Problem List Details Patient Name: Date of Service: [name], [name] 03/24/2019 9:45 AM Medical Record [id number] Patient Account Number: [id number] Date of Birth/Sex: Treating RN: 07/06/1950 (68 y.o. [name], [name] Primary Care Provider: [name] Other Clinician: Referring Provider: Treating Provider/Extender:Maridee Slape, [name], [name] in Treatment: 12 Active Problems ICD-10 Evaluated Encounter Code Description Active Date Today Diagnosis 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, stage 4 12/30/2018 No Yes G82.20 Paraplegia, unspecified 12/30/2018 No Yes E11.622 Type 2 diabetes mellitus with other skin ulcer 12/30/2018 No Yes M86.68 Other chronic osteomyelitis, other site 02/17/2019 No Yes Inactive Problems Resolved Problems Electronic Signature(s) Signed: 03/24/2019 5:01:00 PM By: Linton Ham MD Entered By: Linton Ham on 03/24/2019 11:40:20 -------------------------------------------------------------------------------- Progress Note Details Patient Name: Date of Service: [name], [name] 03/24/2019 9:45 AM Medical Record [id number] Patient Account Number: [id number] Date of Birth/Sex: Treating RN: [birth year] (68 y.o. F) Primary Care Provider: [name] Other Clinician: Referring Provider: Treating Provider/Extender:Lyndol Vanderheiden, [name], [name] in Treatment: 12 Subjective History of Present Illness (HPI) The following HPI elements were documented for the patient's wound: Location: open ulceration of the left gluteal area, left heel and right ankle for about 5 months. Quality: Patient reports No Pain. Severity: Patient states wound(s) are getting worse. Duration: Patient has had the wound for > 5 months prior to seeking treatment at the wound  center Context: The wound occurred when the patient has been paraplegic for about 3 years. Modifying Factors: Wound improving due to current treatment. Associated Signs and Symptoms: Patient reports having foul odor. this 68 year old patient who is known to have hypertension, hypothyroidism, breast cancer, chronic pain syndrome, paraplegia was noted to have a left gluteal decubitus ulcer and was brought into the hospital. During the course of her hospitalization she was debrided in the operating room by Dr. Leland Johns and had all the wounds sharply debrided. The debridement was done for the left ischial wound, the left heel wound and the right ankle wound. Bone cultures were taken at that time but were negative but clinically she was treated for osteomyelitis because of the probing down to bone and open exposed bone. Home health has been giving her antibioticss which include vancomycin and Zosyn. The patient was a smoker until about 3 weeks ago and used to smoke about 10 cigarettes a day for a long while. 12/13/2014 - details of her operative note from 11/03/2014 were reviewed -- PROCEDURE: 1. Excisional debridement skin, subcutaneous, muscle left ischium 35 cm2 2. Excisional debridement skin, subcutaneous tissue left heel 27 cm2 3. Excisional debridement right ankle skin, subcutaneous, bone 30 cm2 01/24/2015 -- she has some issues with her wheelchair cushion but other than that is  doing very well and has received Podus boots for her feet. 02/14/2015 -- she was using her old offloading boots and this seemed to have caused her a new pressure ulcer on the left posterior heel near the superior part just below the Achilles tendon. 03/07/2015 -- she has a new ulceration just to the left of the midline on her sacral region more on the left buttock and this has been there for about a week. 08/22/2015 -- was recently admitted to hospital between May 5 and 08/13/2015, with sepsis and leukocytosis due to a  UTI. she was treated for a sepsis complicating Escherichia coli UTI and kidney stones. She also had metabolic and careful up at the secondary to pyelonephritis. He received broad-spectrum antibiotics initially and then received Macrobid as per urology. She was sent home on nitrofurantoin. during her admission she had a CT scan which showed exposed left ischial tuberosity without evidence of osteolysis. 09/12/2015-- the patient is having some issues with her air mattress and would like to get a opinion from medical modalities. 10/10/2015 -- the issue with her air mattress has not yet been sorted out and the new problem seems to be a lot of odor from the wound VAC. 11/27/2015 -- the patient was admitted to the hospital between July 23 and 10/31/2015. Her problems were sepsis, osteomyelitis of the pelvic bone and acute pyelonephritis. CT of the abdomen and pelvis was consistent with a left- sided pyelonephritis with hydronephrosis and also just showed new sclerosis of the posterior portion of the left anterior pubic ramus suggestive of periosteal reaction consistent with osteomyelitis. She was treated for the osteomyelitis with infectious disease consult recommending 6 weeks of IV antibiotics including vancomycin and Rocephin and the antibiotics were to go on until 12/10/2015. He was seen by Dr. Iran Planas plastic surgery and Dr. Linus Salmons of infectious disease. She had a suprapubic catheter placed during the admission. CT scan done on 10/28/2015 showed specifically -- New sclerosis of the posterior portion of the left inferior pubic ramus with aggressive periosteal reaction, consistent with osteomyelitis, with adjacent soft tissue gas compatible with previously described decubitus ulcer. 12/12/2015 -- she was recently seen by Dr. Linus Salmons, who noted good improvement and CRP and ESR compared to before and he has stopped her antibiotic as per plans to finish on September 4. The patient was encouraged  to continue with wound care and consider hyperbaric oxygen therapy. Today she tells me that she has consented to undergo hyperbaric oxygen therapy and we can start the paperwork. 01/02/2016 -- her PCP had gained about 3 years but she still persists in having problems during hyperbaric oxygen therapy with some discomfort in the ears. 01/09/16; pressure area with underlying osteomyelitis in the left buttock. Wound bed itself has some slight amount of grayish surface slough however I do not think any debridement was necessary. There is no exposed bone soft tissue appears stable. She is using a wound VAC 01/16/16; back for weekly wound review in conjunction with HBO. She has a deep wound over the left initial tuberosity previously treated with 6 weeks of IV antibiotics for osteomyelitis. Wound bed looks reasonably healthy although the base of this is still precariously close to bone. She has been using a wound VAC. 01/23/2016 -- she has completed her course of antibiotics and this week the only new thing is her right great toe nail was avulsed and she has got an open wound over the nailbed. 01/31/16 she has completed her course of antibiotics. Her right great  toenail avulsed last week and she's been using silver alginate for this as well. Still using a wound VAC to the substantial stage IV wound over the left ischial tuberosity 03/05/2016 -- the patient has had a opinion from the plastic surgery group at [facility] and details of this are not available yet but the patient's verbal report has been heard by me. Did not sound like there was any optimistic discussion regarding reconstruction and the net result would be to continue with the wound VAC application. I will await the official reports. Addendum: -- she was seen at [facility] by Dr. Tressa Busman. After a thorough review and from what I understand spending 45 minutes with the patient his assessment has  been noted by me in detail and the management options were: 1. Continued pressure offloading and wound care versus operative procedures including wound excision 2. Soft tissue and bone sampling 3. If the wound gets larger wound closure would be done using a variety of plastic surgical techniques including but not limited to skin substitute, possible skin graft, local versus regional flaps, negative pressure dressing application. 4. He discussed with her details of flap surgery and the risks associated 5. He made a comment that since the patient was operated on by Dr. Leland Johns of [facility] plastic surgery unit in [place] the patient may continue to follow-up there for further evaluation for surgical flap closure in the future. 03/19/2016 -- the patient continues to be rather depressed and frustrated with her lack of rapid progress in healing this wound especially because she thought after hyperbaric oxygen therapy the wound would heal extremely fast. She now understands that was not the implied benefit on wound care which was the recommendation for hyperbaric oxygen therapy. I have had a lengthy discussion with the patient and her husband regarding her options: 1. Continue with collagen and wound VAC for the primary dressing and offloading and all supportive care. 2. See Dr. Iran Planas for possible placement of Acell or Integra in the OR. 3. get a second opinion from a wound care center and surrounding regions/counties 05/07/2016 -- Note from Dr. Celedonio Miyamoto, who noted that the patient has declined flap surgery. She has discussed application of A cell, and try a few applications to see how the wound progresses. She is also recommended that we could apply products here in the wound center, like Oasis. during her preop workup it was found that her hemoglobin A1c was 11% and she has now been diagnosed as having diabetes mellitus and has been put on appropriate treatment by her  PCP 05/28/2016 -- tells me her blood sugars have been doing well and she has an appointment to see her PCP in the next couple of weeks to check her hemoglobin A1c. Other than that she continues to do well. 06/25/2016 -- have not seen her back for the last month but she says her health has been about the same and she has an appointment to check the A1c next week 09/10/16 ---- was seen by Dr. Celedonio Miyamoto -- who applied Acell and saw her back in follow-up. She has recommended silver alginate to the wound every other day and cover with foam. If no significant drainage could transition to collagen every other day. She recommended discontinuing wound VAC. There were no plans to repeat application of Acell. The patient expressed that her husband could do the wound care as going to the Wound Ctr., would cost several $100 for each  visit. 10/21/2016 -- her insurance company is getting her new mattress and she is pleased about that. Other than that she has been doing dressings with PolyMem Silver and has been doing very well 02/18/2017 -- she has gone through several changes of her mattress and has not been pleased with any of them. The ventricles are still working on trying to fit her with the appropriate low air-loss mattress. She has a new wound on the gluteal area which is clearly separated from the original wound. 03/25/17-she is here in follow-up evaluation for her left ischialpressure ulcer. She remains unsatisfied with her pressure mattress. She admits to sitting multiple hours a day, in the bed. We have discussed offloading options. The wound does not appear infected. Nutrition does not appear to be a concern. Will follow-up in 4 weeks, if wound continues to be stalled may consider x-ray to evaluate for refractory osteomyelitis. 04/21/17; this is a patient that I don't know all that well. She has a chronic wound which at one point had underlying osteomyelitis in the left ischial tuberosity. This  is a stage IV pressure ulcer. Over the last 3 months she has a stage II wound inferiorly to the original wound. The last time she was here her dressing was changed to silver collagen although the patient's husband who changes the dressing said that the collagen stuck to the wound and remove skin from the superficial area therefore he switched back to [place] 05/13/17; this is a patient we've been following for a left ischial tuberosity wound which was stage IV at one point had underlying osteomyelitis. Over the last several months she's had a stage II wound just inferior and medial to the related to the wound. According to her husband he is using Endoform layer with collagen although this is not what I had last time. According to her husband they are using Elgie [place] with collagen although I don't quite know how that started. She was hospitalized from 1/20 through 04/30/16. This was related to a UTI. Her blood cultures were negative, urine culture showed multiple species. She did have a CT scan of the abdomen and pelvis which documented chronic osteomyelitis in the area of the wound inflammatory markers were unremarkable. She has had prior knowledge of osteomyelitis. It looks as though she received IV antibiotics in 2017 and was treated with a course of hyperbaric oxygen. 05/28/17; the wound over the left ischial tuberosity is deeper today and abuts clearly on bone. Nursing intake reported drainage. I therefore culture of the wound. The more superficial area just below this looks about the same. They once again complained that there are mattress cover is not working although apparently [facility] is been noted to see this many times in the report is that the device is functional 06/18/17; the patient had a probing area on the left ischial tuberosity that was draining purulent fluid last time. This also clearly seemed to have open bone. Culture I did showed pansensitive pseudomonas  including third generation cephalosporins. I treated this with cefdinir 300 twice a day for 10 days and things seem to have improved. She has a more superficial wound just underneath this area. Amazingly she has a new air mattress through [facility]. I think they gave this to her as a parking give. In any case this now works according to the patient may have something to do with why the areas are looking better. 07/09/17; the patient has a probing area in the left ischial tuberosity  that still has some depth. However this is contracted in terms of the wound orifice although the depth is still roughly the same. There is no undermining. ooShe also has the satellite wound which is more superficial. This appears to have a healthy surface we've been using silver collagen 08/06/17; the patient's wound is over the left ischial tuberosity and a satellite lesion just underneath this. The original wound was actually a deep stage 4 wound. We have made good progress in 2 months and there is no longer exposed bone here. 09/03/17; left ischial tuberosity actually appears to be quite healthy. I think we are making progress. No debridement is required. There is no surrounding erythema 10/01/17 I follow this patient monthly for her left ischial tuberosity wound. There is 2 areas the original area and a satellite area. The satellite area looks a lot better there is no surrounding erythema. Her husband relates that he is having trouble maintaining the dressing. This has to do with the soft tissue around it. He states he puts the collagen in but he cannot make sure that it stays in even with the ABD pads and tape that he is been using 10/29/17; patient arrives with a better looking noon today. Some of the satellite lesions have closed. using Prisma 11/26/17; the patient has a large cone-shaped area with the tip of the Cone deep within her buttock soft tissue. The walls of the Cone are epithelialized however the  base is still open. The area at the base of this looks moist we've been using silver collagen. Will change to silver alginate 12/31/2017; the wound appears to have come in fairly nicely. Using silver alginate. There is no surrounding maceration or infection 01/28/18; there is still an open area here over the left initial tuberosity. Base of this however looks healthy. There is no surrounding infection 02/25/18; the area of its open is over the left ischial tuberosity. The base of this is where the wound is. This is a large inverted cone-shaped area with the wound at the tip. Dimensions of the wound at the tip are improved. There is a area of denuded skin about halfway towards the tip which her husband thinks may have happened today when he was bathing her. 04/20/17; the area is still open over the left initial tuberosity. 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 This is now completely closed over although it is closed over with a divot and skin over bone at the base of this. Continued aggressive offloading will be necessary. 12/30/2018 READMISSION This is a 68 year old woman with chronic paraplegia. I picked her up for her care from Dr. Con Memos in this clinic after he departed. She had a stage IV pressure wound over the left ischial tuberosity. She was treated twice for her underlying osteomyelitis and this I believe firstly in 2016 and again in 2017. There were some plans at some point for flap closure of this however she was discovered to have uncontrolled diabetes and I do not think this was ever accomplished. She ultimately healed over in this clinic and was discharged in May. She has a large cone-shaped indentation with the tip of this going towards the left ischial tuberosity. It is not an easy area  to examine but at that time I thought all of this was  epithelialized. Apparently there was a reopening here shortly after she left the clinic last time. She was admitted to hospital at the end of June for Klebsiella bacteremia felt to be secondary to UTI. A CT scan of the pelvis is listed below and there was initially some concern that she had underlying osteomyelitis although I believe she was seen by infectious disease and that was felt to be not the case: I do not see any new cultures or inflammatory markers IMPRESSION: 1. No CT evidence for acute intra-abdominal or pelvic abnormality. Large volume of stool throughout the colon. 2. Enlarged fatty liver with fat sparing near the gallbladder fossa 3. Cortical scarring right kidney. Bilateral intrarenal stones without hydronephrosis. Thick-walled urinary bladder decompressed by suprapubic catheter 4. Deep left decubitus ulcer with underlying left ischial changes suggesting osteomyelitis. Her husband has been using silver collagen in the wound. She has not been systemically unwell no fever chills eating and drinking well. They rigorously offload this wound only getting up in the wheelchair when she is going to appointments the rest of the time she is in bed. 10/8; wound measures larger and she now has exposed bone. We have been using silver alginate 11/12 still using silver alginate. The patient saw Dr. Megan Salon of infectious disease. She was started on Augmentin 500 mg twice daily. She is due to follow-up with Dr. Megan Salon I believe next week. Lab work Dr. Megan Salon requested showed a sedimentation rate of 28 and CRP of 20 although her CRP 1 year ago was 18.8. Sedimentation rate 1 year ago was 11 basic metabolic panel showed a creatinine of 1.12 12/3; the patient followed up with Dr. Megan Salon yesterday. She is still on Augmentin twice daily. This was directed by Dr. Megan Salon. The patient's inflammatory markers have improved which is  gratifying. Her C-reactive protein was repeated yesterday and follow-up booked with infectious disease in January. In addition I have been getting secure text messages I think from palliative care through [facility] [employer]. I think they were hoping to provide services to the patient in her home. They could not get a hold of the primary physician and so they reached out to me on 2 separate occasions. 12/17; patient last saw Dr. Megan Salon on 12/2. She is finishing up with Augmentin. Her C-reactive protein was 20 on 10/21, 10.1 on 11/19 and 17 on 12/2. The wound itself still has depth and undermining. We are using Santyl with the backing wet-to-dry Objective Constitutional Patient is hypertensive.. Pulse regular and within target range for patient.Marland Kitchen Respirations regular, non-labored and within target range.. Temperature is normal and within the target range for the patient.Marland Kitchen Appears in no distress. Vitals Time Taken: 10:05 AM, Height: 63 in, Weight: 185 lbs, BMI: 32.8, Temperature: 97.7 F, Pulse: 83 bpm, Respiratory Rate: 18 breaths/min, Blood Pressure: 146/64 mmHg. General Notes: Wound exam; left ischial tuberosity. Necrotic debris at the base of this removed with a #5 curette hemostasis with direct pressure. I think the base of this is more extensive than I remember with considerable undermining towards the midline. There is no surrounding infection Integumentary (Hair, Skin) Wound #12 status is Open. Original cause of wound was Pressure Injury. The wound is located on the Left Ischial Tuberosity. The wound measures 2cm length x 1.7cm width x 2cm depth; 2.67cm^2 area and 5.341cm^3 volume. There is Fat Layer (Subcutaneous Tissue) Exposed exposed. There is no tunneling or undermining noted. There is a medium amount of serosanguineous drainage noted. The wound margin is well  defined and not attached to the wound base. There is medium (34-66%) pink granulation within the wound  bed. There is a medium (34-66%) amount of necrotic tissue within the wound bed including Adherent Slough. Assessment Active Problems ICD-10 Pressure ulcer of left buttock, stage 4 Paraplegia, unspecified Type 2 diabetes mellitus with other skin ulcer Other chronic osteomyelitis, other site Procedures Wound #12 Pre-procedure diagnosis of Wound #12 is a Pressure Ulcer located on the Left Ischial Tuberosity . There was a Excisional Skin/Subcutaneous Tissue Debridement with a total area of 1 sq cm performed by Ricard Dillon., MD. With the following instrument(s): Curette to remove Viable and Non-Viable tissue/material. Material removed includes Subcutaneous Tissue, Slough, and Fibrin/Exudate after achieving pain control using Lidocaine 4% Topical Solution. A time out was conducted at 10:58, prior to the start of the procedure. A Moderate amount of bleeding was controlled with Pressure. The procedure was tolerated well with a pain level of 0 throughout and a pain level of 3 following the procedure. Post Debridement Measurements: 2cm length x 1.7cm width x 2cm depth; 5.341cm^3 volume. Post debridement Stage noted as Category/Stage IV. Character of Wound/Ulcer Post Debridement is improved. Post procedure Diagnosis Wound #12: Same as Pre-Procedure Plan Follow-up Appointments: Return appointment in 3 weeks. Dressing Change Frequency: Change dressing every day. Skin Barriers/Peri-Wound Care: Skin Prep - to periwound. Barrier cream - as needed for maceration, wetness, or redness to periwound. Wound Cleansing: Wound #12 Left Ischial Tuberosity: May shower and wash wound with soap and water. - or wound cleanser Primary Wound Dressing: Wound #12 Left Ischial Tuberosity: Santyl Ointment - apply to wound bed. apply saline moisten gauze over Santyl. Secondary Dressing: Wound #12 Left Ischial Tuberosity: ABD pad - or bordered foam. Off-Loading: Low air-loss mattress (Group 2) - continue to  use Gel wheelchair cushion - continue to use specialty wheelchair cushion. Turn and reposition every 2 hours 1. We continued with the Santyl 2. She has an appointment with Dr. Megan Salon in follow-up on January 21. She is completing the Augmentin that he gave her last time 3. I do not think this area would be amenable to a wound VAC. The patient complained about the cost of coming in to see [place] here. Mostly I think the facility charge that is not covered by insurance 4. I do not know once we have finished with the Santyl whether there will be an option other than a collagen wet-to- dry. I was thinking of advanced treatment options although the cost of these may be prohibitive Electronic Signature(s) Signed: 03/24/2019 5:01:00 PM By: Linton Ham MD Entered By: Linton Ham on 03/24/2019 11:45:06 -------------------------------------------------------------------------------- SuperBill Details Patient Name: Date of Service: TIMMI, [name] 03/24/2019 Medical Record Number:[id number] Patient Account Number: [id number] Date of Birth/Sex: Treating RN: 1950/12/28 (68 y.o. [name], [name] Primary Care Provider: [name] Other Clinician: Referring Provider: Treating Provider/Extender:Jojo Pehl, [name], [name] in Treatment: 12 Diagnosis Coding ICD-10 Codes Code Description [phone] Pressure ulcer of left buttock, stage 4 G82.20 Paraplegia, unspecified E11.622 Type 2 diabetes mellitus with other skin ulcer M86.68 Other chronic osteomyelitis, other site Facility Procedures The patient participates with Medicare or their insurance follows the Medicare Facility Guidelines: CPT4 Code Description Modifier Quantity [id number] 11042 - DEB SUBQ TISSUE 20 SQ CM/< 1 ICD-10 Diagnosis Description 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

## 2019-03-24 NOTE — Progress Notes (Addendum)
KAIRI, HARSHBARGER ([id number]) Visit Report for 03/24/2019 Arrival Information Details Patient Name: Date of Service: HAIVEN, NARDONE 03/24/2019 9:45 AM Medical Record Number:[id number] Patient Account Number: [id number] Date of Birth/Sex: Treating RN: 1950/07/04 (68 y.o. [name] Primary Care Keandrea Tapley: [name] Other Clinician: Referring Shontel Santee: Treating Omaria Plunk/Extender:[name], [name], [name] in Treatment: 12 Visit Information History Since Last Visit Added or deleted any medications: No Patient Arrived: Wheel Chair Any new allergies or adverse reactions: No Arrival Time: 10:07 Had a fall or experienced change in No activities of daily living that may affect Accompanied By: husband risk of falls: Transfer Assistance: Other Signs or symptoms of abuse/neglect since last No Patient Identification Verified: Yes visito Secondary Verification Process Completed: Yes Hospitalized since last visit: No Patient Requires Transmission-Based No Implantable device outside of the clinic excluding No Precautions: cellular tissue based products placed in the center Patient Has Alerts: No since last visit: Has Dressing in Place as Prescribed: Yes Pain Present Now: No Notes husband places patient in the chair Electronic Signature(s) Signed: 03/24/2019 5:33:40 PM By: Kela Millin Entered By: Kela Millin on 03/24/2019 10:07:44 -------------------------------------------------------------------------------- Encounter Discharge Information Details Patient Name: Date of Service: LIYAT, FAULKENBERRY 03/24/2019 9:45 AM Medical Record [id number] Patient Account Number: [id number] Date of Birth/Sex: Treating RN: 06-Nov-1950 (68 y.o. [name] Primary Care Lyrik Dockstader: [name] Other Clinician: Referring Kinley Dozier: Treating Tevion Laforge/Extender:[name], [name], [name] in Treatment: 12 Encounter Discharge Information Items Post Procedure  Vitals Discharge Condition: Stable Temperature (F): 97.7 Ambulatory Status: Wheelchair Pulse (bpm): 83 Discharge Destination: Home Respiratory Rate (breaths/min): 18 Transportation: Private Auto Blood Pressure (mmHg): 146/64 Accompanied By: husband Schedule Follow-up Appointment: Yes Clinical Summary of Care: Patient Declined Electronic Signature(s) Signed: 03/24/2019 4:51:50 PM By: Carlene Coria RN Entered By: Carlene Coria on 03/24/2019 11:38:14 -------------------------------------------------------------------------------- Lower Extremity Assessment Details Patient Name: Date of Service: SHELLE, GALDAMEZ 03/24/2019 9:45 AM Medical Record Number:[id number] Patient Account Number: [id number] Date of Birth/Sex: Treating RN: July 19, 1950 (68 y.o. [name] Primary Care Keaja Reaume: [name] Other Clinician: Referring Achilles Neville: Treating Pearly Apachito/Extender:[name], [name], [name] in Treatment: 12 Electronic Signature(s) Signed: 03/24/2019 5:33:40 PM By: Kela Millin Entered By: Kela Millin on 03/24/2019 10:11:11 -------------------------------------------------------------------------------- Multi Wound Chart Details Patient Name: Date of Service: ALAYLA, DETHLEFS 03/24/2019 9:45 AM Medical Record [id number] Patient Account Number: [id number] Date of Birth/Sex: Treating RN: [birth year] (68 y.o. F) Primary Care Thamara Leger: [name] Other Clinician: Referring Devon Kingdon: Treating Lakeya Mulka/Extender:[name], [name], [name] in Treatment: 12 Vital Signs Height(in): 53 Pulse(bpm): 41 Weight(lbs): 185 Blood Pressure(mmHg): 146/64 Body Mass Index(BMI): 33 Temperature(F): 97.7 Respiratory 18 Rate(breaths/min): Photos: [12:No Photos] [N/A:N/A] Wound Location: [12:Left Ischial Tuberosity] [N/A:N/A N/A] Wounding Event: [12:Pressure Injury] [N/A:N/A N/A] Primary Etiology: [12:Pressure Ulcer] [N/A:N/A N/A] Comorbid History: [12:Anemia, Hypertension,  TypeN/A II Diabetes, Osteoarthritis, Dementia, Paraplegia, Received Radiation] [N/A:N/A] Date Acquired: [12:09/06/2018] [N/A:N/A N/A] Weeks of Treatment: [12:12] [N/A:N/A N/A] Wound Status: [12:Open] [N/A:N/A N/A] Measurements L x W x D 2x1.7x2 [N/A:N/A N/A] (cm) Area (cm) : [12:2.67] [N/A:N/A N/A] Volume (cm) : [12:5.341] [N/A:N/A N/A] % Reduction in Area: [12:-1031.40%] [N/A:N/A N/A] % Reduction in Volume: -2419.30% [N/A:N/A N/A] Classification: [12:Category/Stage IV] [N/A:N/A N/A] Exudate Amount: [12:Medium] [N/A:N/A N/A] Exudate Type: [12:Serosanguineous] [N/A:N/A N/A] Exudate Color: [12:red, brown] [N/A:N/A N/A] Wound Margin: [12:Well defined, not attached N/A] [N/A:N/A] Granulation Amount: [12:Medium (34-66%)] [N/A:N/A N/A] Granulation Quality: [12:Pink] [N/A:N/A N/A] Necrotic Amount: [12:Medium (34-66%)] [N/A:N/A N/A] Exposed Structures: [12:Fat Layer (Subcutaneous N/A Tissue) Exposed: Yes Fascia: No Tendon: No Muscle: No Joint: No Bone: No] [N/A:N/A] Epithelialization: [12:None] [N/A:N/A  N/A] Debridement: [12:Debridement - Excisional N/A] [N/A:N/A] Pre-procedure [12:10:58] [N/A:N/A N/A] Verification/Time Out Taken: Pain Control: [12:Lidocaine 4% Topical Solution] [N/A:N/A N/A] Tissue Debrided: [12:Subcutaneous, Slough] [N/A:N/A N/A] Level: [12:Skin/Subcutaneous Tissue N/A] [N/A:N/A] Debridement Area (sq cm):1 [N/A:N/A N/A] Instrument: [12:Curette] [N/A:N/A N/A] Bleeding: [12:Moderate] [N/A:N/A N/A] Hemostasis Achieved: [12:Pressure] [N/A:N/A N/A] Procedural Pain: [12:0] [N/A:N/A N/A] Post Procedural Pain: [12:3] [N/A:N/A N/A] Debridement Treatment Procedure was tolerated [N/A:N/A N/A] Response: [12:well] Post Debridement [12:2x1.7x2] [N/A:N/A N/A] Measurements L x W x D (cm) Post Debridement [12:5.341] [N/A:N/A N/A] Volume: (cm) Post Debridement Stage: Category/Stage IV [N/A:N/A N/A N/A N/A] Treatment Notes Wound #12 (Left Ischial Tuberosity) 1. Cleanse  With Wound Cleanser 3. Primary Dressing Applied Santyl Other primary dressing (specifiy in notes) 4. Secondary Dressing Foam Border Dressing Notes saline moist gauze Electronic Signature(s) Signed: 03/24/2019 5:01:00 PM By: Linton Ham MD Entered By: Linton Ham on 03/24/2019 11:40:32 -------------------------------------------------------------------------------- Multi-Disciplinary Care Plan Details Patient Name: Date of Service: ESHIKA, RECKART 03/24/2019 9:45 AM Medical Record [id number] Patient Account Number: [id number] Date of Birth/Sex: Treating RN: [birth year] (68 y.o. [name], [name] Primary Care Taahir Grisby: [name] Other Clinician: Referring Lesean Woolverton: Treating Ireta Pullman/Extender:[name], [name], [name] in Treatment: 12 Active Inactive Abuse / Safety / Falls / Self Care Management Nursing Diagnoses: Impaired physical mobility Potential for falls Goals: Patient will remain injury free related to falls Date Initiated: 12/30/2018 Target Resolution Date: 04/01/2019 Goal Status: Active Patient/caregiver will verbalize/demonstrate measure taken to improve self care Date Inactivated: 01/13/2019 Target10/22/2020 Resolution Date Initiated: 12/30/2018 Date: Goal Status: Met Interventions: Assess fall risk on admission and as needed Provide education on fall prevention Notes: Electronic Signature(s) Signed: 03/24/2019 5:34:18 PM By: Deon Pilling Entered By: Deon Pilling on 03/24/2019 10:30:17 -------------------------------------------------------------------------------- Pain Assessment Details Patient Name: Date of Service: LAIAH, POUNCEY 03/24/2019 9:45 AM Medical Record [id number] Patient Account Number: [id number] Date of Birth/Sex: Treating RN: 14-Jan-1951 (68 y.o. [name] Primary Care Lovell Nuttall: [name] Other Clinician: Referring Najmah Carradine: Treating Raymona Boss/Extender:[name], [name], [name] in Treatment:  12 Active Problems Location of Pain Severity and Description of Pain Patient Has Paino No Site Locations Pain Management and Medication Current Pain Management: Electronic Signature(s) Signed: 03/24/2019 5:33:40 PM By: Kela Millin Entered By: Kela Millin on 03/24/2019 10:11:05 -------------------------------------------------------------------------------- Patient/Caregiver Education Details Patient Name: Date of Service: Betha Loa 12/17/2020andnbsp9:45 AM Medical Record [id number] Patient Account Number: [id number] Date of Birth/Gender: Treating RN: Dec 14, 1950 (68 y.o. [name], [name] Primary Care Physician: [name] Other Clinician: Referring Physician: Treating Physician/Extender:[name], [name], [name] in Treatment: 12 Education Assessment Education Provided To: Patient Education Topics Provided Safety: Handouts: Safety, Safety 2 Methods: Explain/Verbal Responses: Reinforcements needed Electronic Signature(s) Signed: 03/24/2019 5:34:18 PM By: Deon Pilling Entered By: Deon Pilling on 03/24/2019 10:30:32 -------------------------------------------------------------------------------- Wound Assessment Details Patient Name: Date of Service: AMAIYAH, NORDHOFF 03/24/2019 9:45 AM Medical Record [id number] Patient Account Number: [id number] Date of Birth/Sex: Treating RN: 1950-05-22 (68 y.o. [name] Primary Care Shakiah Wester: [name] Other Clinician: Referring Evelio Rueda: Treating Taiquan Campanaro/Extender:[name], [name], [name] in Treatment: 12 Wound Status Wound Number: 12 Primary Pressure Ulcer Etiology: Wound Location: Left Ischial Tuberosity Wound Open Wounding Event: Pressure Injury Status: Date Acquired: 09/06/2018 Comorbid Anemia, Hypertension, Type II Diabetes, Weeks Of Treatment: 12 History: Osteoarthritis, Dementia, Paraplegia, Received Clustered Wound: No Radiation Photos Wound Measurements Length: (cm)  2 % Reductio Width: (cm) 1.7 % Reductio Depth: (cm) 2 Epithelial Area: (cm) 2.67 Tunneling Volume: (cm) 5.341 Undermini Wound Description Classification: Category/Stage IV Wound Margin: Well defined, not attached Exudate Amount: Medium Exudate  Type: Serosanguineous Exudate Color: red, brown Wound Bed Granulation Amount: Medium (34-66%) Granulation Quality: Pink Necrotic Amount: Medium (34-66%) Necrotic Quality: Adherent Slough Foul Odor After Cleansing: No Slough/Fibrino Yes Exposed Structure Fascia Exposed: No Fat Layer (Subcutaneous Tissue) Exposed: Yes Tendon Exposed: No Muscle Exposed: No Joint Exposed: No Bone Exposed: No n in Area: -1031.4% n in Volume: -2419.3% ization: None : No ng: No Treatment Notes Wound #12 (Left Ischial Tuberosity) 1. Cleanse With Wound Cleanser 3. Primary Dressing Applied Santyl Other primary dressing (specifiy in notes) 4. Secondary Dressing Foam Border Dressing Notes saline moist gauze Electronic Signature(s) Signed: 03/25/2019 5:07:56 PM By: Mikeal Hawthorne EMT/HBOT Signed: 03/25/2019 5:42:17 PM By: Kela Millin Previous Signature: 03/24/2019 5:33:40 PM Version By: Kela Millin Entered By: Mikeal Hawthorne on 03/25/2019 14:46:47 -------------------------------------------------------------------------------- Vitals Details Patient Name: Date of Service: DESARAE, PLACIDE 03/24/2019 9:45 AM Medical Record [id number] Patient Account Number: [id number] Date of Birth/Sex: Treating RN: 17-Oct-1950 (68 y.o. [name] Primary Care Khalidah Herbold: [name] Other Clinician: Referring Joanell Cressler: Treating Mikella Linsley/Extender:[name], [name], [name] in Treatment: 12 Vital Signs Time Taken: 10:05 Temperature (F): 97.7 Height (in): 63 Pulse (bpm): 83 Weight (lbs): 185 Respiratory Rate (breaths/min): 18 Body Mass Index (BMI): 32.8 Blood Pressure (mmHg): 146/64 Reference Range: 80 - 120 mg / dl Electronic  Signature(s) Signed: 03/24/2019 5:33:40 PM By: Kela Millin Entered By: Kela Millin on 03/24/2019 10:08:06

## 2019-03-28 DIAGNOSIS — L97409 Non-pressure chronic ulcer of unspecified heel and midfoot with unspecified severity: Secondary | ICD-10-CM | POA: Diagnosis not present

## 2019-03-28 DIAGNOSIS — R339 Retention of urine, unspecified: Secondary | ICD-10-CM | POA: Diagnosis not present

## 2019-04-14 ENCOUNTER — Other Ambulatory Visit: Payer: Self-pay

## 2019-04-14 ENCOUNTER — Encounter (HOSPITAL_BASED_OUTPATIENT_CLINIC_OR_DEPARTMENT_OTHER): Payer: Medicare Other | Attending: Internal Medicine | Admitting: Internal Medicine

## 2019-04-14 DIAGNOSIS — L89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, stage 4: Secondary | ICD-10-CM | POA: Diagnosis not present

## 2019-04-14 DIAGNOSIS — E11622 Type 2 diabetes mellitus with other skin ulcer: Secondary | ICD-10-CM | POA: Insufficient documentation

## 2019-04-14 DIAGNOSIS — G8220 Paraplegia, unspecified: Secondary | ICD-10-CM | POA: Insufficient documentation

## 2019-04-15 NOTE — Progress Notes (Addendum)
[name], [name] ([id number]) Visit Report for 04/14/2019 Arrival Information Details Patient Name: Date of Service: [name], [name] 04/14/2019 10:15 AM Medical Record Number:[id number] Patient Account Number: [id number] Date of Birth/Sex: Treating RN: 1951-02-20 (69 y.o. [name] Primary Care Kyree Adriano: [name] Other Clinician: Referring Maddoxx Burkitt: Treating Tydus Sanmiguel/Extender:[name], [name], [name] in Treatment: 15 Visit Information History Since Last Visit Added or deleted any medications: No Patient Arrived: Wheel Chair Any new allergies or adverse reactions: No Arrival Time: 11:15 Had a fall or experienced change in No activities of daily living that may affect Accompanied By: spouse risk of falls: Transfer Assistance: Other Signs or symptoms of abuse/neglect since last No Patient Identification Verified: Yes visito Secondary Verification Process Completed: Yes Hospitalized since last visit: No Patient Requires Transmission-Based No Implantable device outside of the clinic excluding No Precautions: cellular tissue based products placed in the center Patient Has Alerts: No since last visit: Has Dressing in Place as Prescribed: Yes Pain Present Now: No Notes husband transfers patient Electronic Signature(s) Signed: 04/14/2019 5:21:35 PM By: Kela Millin Entered By: Kela Millin on 04/14/2019 11:15:54 -------------------------------------------------------------------------------- Complex / Palliative Patient Assessment Details Patient Name: Date of Service: [name], [name] 04/14/2019 10:15 AM Medical Record [id number] Patient Account Number: [id number] Date of Birth/Sex: Treating RN: [birth year] (69 y.o. [name] Primary Care Salmaan Patchin: [name] Other Clinician: Referring Zaiden Ludlum: Treating Biddie Sebek/Extender:[name], [name], [name] in Treatment: 15 Palliative Management Criteria Complex Wound Management Criteria Patient  has remarkable or complex co-morbidities requiring medications or treatments that extend wound healing times. Examples: Diabetes mellitus with chronic renal failure or end stage renal disease requiring dialysis Advanced or poorly controlled rheumatoid arthritis Diabetes mellitus and end stage chronic obstructive pulmonary disease Active cancer with current chemo- or radiation therapy Type 2 Diabetes, Chronic Osteomyelitis, Paraplegia Care Approach Wound Care Plan: Complex Wound Management Electronic Signature(s) Signed: 05/06/2019 5:55:42 PM By: Linton Ham MD Signed: 06/02/2019 2:23:51 PM By: Levan Hurst RN, BSN Entered By: Levan Hurst on 05/06/2019 09:51:46 -------------------------------------------------------------------------------- Encounter Discharge Information Details Patient Name: Date of Service: [name], [name] 04/14/2019 10:15 AM Medical Record [id number] Patient Account Number: [id number] Date of Birth/Sex: Treating RN: 1950/12/28 (69 y.o. [name] Primary Care Coren Crownover: [name] Other Clinician: Referring Rodney Wigger: Treating Neema Fluegge/Extender:[name], [name], [name] in Treatment: 15 Encounter Discharge Information Items Post Procedure Vitals Discharge Condition: Stable Temperature (F): 97.6 Ambulatory Status: Wheelchair Pulse (bpm): 83 Discharge Destination: Home Respiratory Rate (breaths/min): 18 Transportation: Private Auto Blood Pressure (mmHg): 118/57 Accompanied By: spouse Schedule Follow-up Appointment: Yes Clinical Summary of Care: Patient Declined Electronic Signature(s) Signed: 04/14/2019 5:27:53 PM By: Baruch Gouty RN, BSN Entered By: Baruch Gouty on 04/14/2019 12:11:13 -------------------------------------------------------------------------------- Lower Extremity Assessment Details Patient Name: Date of Service: [name], [name] 04/14/2019 10:15 AM Medical Record [id number] Patient Account Number:  [id number] Date of Birth/Sex: Treating RN: 1951/03/01 (69 y.o. [name] Primary Care [name]: [name] Other Clinician: Referring Anothy Bufano: Treating Tyona Nilsen/Extender:[name], [name], [name] in Treatment: 15 Electronic Signature(s) Signed: 04/14/2019 5:21:35 PM By: Kela Millin Entered By: Kela Millin on 04/14/2019 11:16:37 -------------------------------------------------------------------------------- Multi Wound Chart Details Patient Name: Date of Service: [name], [name] 04/14/2019 10:15 AM Medical Record [id number] Patient Account Number: [id number] Date of Birth/Sex: Treating RN: 06-Sep-1950 (69 y.o. F) Primary Care Aubrianne Molyneux: [name] Other Clinician: Referring Kiara Mcdowell: Treating Chera Slivka/Extender:[name], [name], [name] in Treatment: 15 Vital Signs Height(in): 63 Pulse(bpm): 38 Weight(lbs): 185 Blood Pressure(mmHg): 118/57 Body Mass Index(BMI): 33 Temperature(F): 97.6 Respiratory 18 Rate(breaths/min): Photos: [12:No Photos] [N/A:N/A] Wound Location: [12:Left  Ischial Tuberosity] [N/A:N/A] Wounding Event: [12:Pressure Injury] [N/A:N/A] Primary Etiology: [12:Pressure Ulcer] [N/A:N/A] Comorbid History: [12:Anemia, Hypertension, TypeN/A II Diabetes, Osteoarthritis, Dementia, Paraplegia, Received Radiation] Date Acquired: [12:09/06/2018] [N/A:N/A] Weeks of Treatment: [12:15] [N/A:N/A] Wound Status: [12:Open] [N/A:N/A] Measurements L x W x D 2x2.2x3.8 [N/A:N/A] (cm) Area (cm) : [12:3.456] [N/A:N/A] Volume (cm) : [12:13.132] [N/A:N/A] % Reduction in Area: [12:-1364.40%] [N/A:N/A] % Reduction in Volume: -6094.30% [N/A:N/A] Classification: [12:Category/Stage IV] [N/A:N/A] Exudate Amount: [12:Medium] [N/A:N/A] Exudate Type: [12:Serosanguineous] [N/A:N/A] Exudate Color: [12:red, brown] [N/A:N/A] Wound Margin: [12:Well defined, not attached N/A] Granulation Amount: [12:Medium (34-66%)] [N/A:N/A] Granulation Quality: [12:Pink]  [N/A:N/A] Necrotic Amount: [12:Medium (34-66%)] [N/A:N/A] Exposed Structures: [12:Fat Layer (Subcutaneous N/A Tissue) Exposed: Yes Fascia: No Tendon: No Muscle: No Joint: No Bone: No] Epithelialization: [12:None] [N/A:N/A] Debridement: [12:Debridement - Excisional] [N/A:N/A] Pre-procedure [12:11:40] [N/A:N/A] Verification/Time Out Taken: Pain Control: [12:Lidocaine 4% Topical Solution] [N/A:N/A] Tissue Debrided: [12:Subcutaneous, Slough] [N/A:N/A] Level: [12:Skin/Subcutaneous Tissue] [N/A:N/A] Debridement Area (sq cm):4.4 [N/A:N/A] Instrument: [12:Curette] [N/A:N/A] Bleeding: [12:Moderate] [N/A:N/A] Hemostasis Achieved: [12:Pressure] [N/A:N/A] Procedural Pain: [12:0] [N/A:N/A] Post Procedural Pain: [12:3] [N/A:N/A] Debridement Treatment Procedure was tolerated [N/A:N/A] Response: [12:well] Post Debridement [12:2x2.2x3.8] [N/A:N/A] Measurements L x W x D (cm) Post Debridement [12:13.132] [N/A:N/A] Volume: (cm) Post Debridement Stage: Category/Stage IV [N/A:N/A N/A] Treatment Notes Electronic Signature(s) Signed: 04/15/2019 4:55:01 AM By: Linton Ham MD Entered By: Linton Ham on 04/14/2019 11:54:42 -------------------------------------------------------------------------------- Multi-Disciplinary Care Plan Details Patient Name: Date of Service: [name], [name] 04/14/2019 10:15 AM Medical Record [id number] Patient Account Number: [id number] Date of Birth/Sex: Treating RN: [birth year] (69 y.o. [name], [name] Primary Care Oscar Hank: [name] Other Clinician: Referring Zyiere Rosemond: Treating Arryanna Holquin/Extender:[name], [name], [name] in Treatment: 15 Active Inactive Abuse / Safety / Falls / Self Care Management Nursing Diagnoses: Impaired physical mobility Potential for falls Goals: Patient will remain injury free related to falls Date Initiated: 12/30/2018 Target Resolution Date: 06/03/2019 Goal Status: Active Patient/caregiver will verbalize/demonstrate  measure taken to improve self care Date Inactivated: 01/13/2019 Target Resolution Date Initiated: 12/30/2018 Date: 01/27/2019 Goal Status: Met Interventions: Assess fall risk on admission and as needed Provide education on fall prevention Notes: Electronic Signature(s) Signed: 04/14/2019 5:19:45 PM By: Deon Pilling Entered By: Deon Pilling on 04/14/2019 11:52:32 -------------------------------------------------------------------------------- Pain Assessment Details Patient Name: Date of Service: [name], [name] 04/14/2019 10:15 AM Medical Record [id number] Patient Account Number: [id number] Date of Birth/Sex: Treating RN: 08-17-1950 (69 y.o. [name] Primary Care Christ Fullenwider: [name] Other Clinician: Referring Lesley Galentine: Treating Reather Steller/Extender:[name], [name], [name] in Treatment: 15 Active Problems Location of Pain Severity and Description of Pain Patient Has Paino No Site Locations Pain Management and Medication Current Pain Management: Electronic Signature(s) Signed: 04/14/2019 5:21:35 PM By: Kela Millin Entered By: Kela Millin on 04/14/2019 11:16:30 -------------------------------------------------------------------------------- Patient/Caregiver Education Details Patient Name: Date of Service: Betha Loa 1/7/2021andnbsp10:15 AM Medical Record [id number] [id number] Patient Account Number: [id number] Date of Birth/Gender: Treating RN: 02/25/1951 (69 y.o. [name], [name] Primary Care Physician: [name] Other Clinician: Referring Physician: Treating Physician/Extender:[name], [name], [name] in Treatment: 15 Education Assessment Education Provided To: Patient and Caregiver Education Topics Provided Safety: Handouts: Personal Safety Methods: Explain/Verbal Responses: Reinforcements needed Electronic Signature(s) Signed: 04/14/2019 5:19:45 PM By: Deon Pilling Entered By: Deon Pilling on 04/14/2019  11:52:44 -------------------------------------------------------------------------------- Wound Assessment Details Patient Name: Date of Service: [name], [name] 04/14/2019 10:15 AM Medical Record [id number] Patient Account Number: [id number] Date of Birth/Sex: Treating RN: 10/22/1950 (69 y.o. [name] Primary Care Anola Mcgough: [name] Other Clinician: Referring Izza Bickle: Treating Chardonay Scritchfield/Extender:[name], [name], [name] in Treatment: 45  Wound Status Wound Number: 12 Primary Pressure Ulcer Etiology: Wound Location: Left Ischial Tuberosity Wound Open Wounding Event: Pressure Injury Status: Date Acquired: 09/06/2018 Comorbid Anemia, Hypertension, Type II Diabetes, Weeks Of Treatment: 15 History: Osteoarthritis, Dementia, Paraplegia, Received Clustered Wound: No Clustered Wound: No Radiation Photos Wound Measurements Length: (cm) 2 % Reductio Width: (cm) 2.2 % Reductio Depth: (cm) 3.8 Epithelial Area: (cm) 3.456 Tunneling Volume: (cm) 13.132 Undermini Wound Description Classification: Category/Stage IV Wound Margin: Well defined, not attached Exudate Amount: Medium Exudate Type: Serosanguineous Exudate Color: red, brown Wound Bed Granulation Amount: Medium (34-66%) Granulation Quality: Pink Necrotic Amount: Medium (34-66%) Necrotic Quality: Adherent Slough Foul Odor After Cleansing: No Slough/Fibrino Yes Exposed Structure Fascia Exposed: No Fat Layer (Subcutaneous Tissue) Exposed: Yes Tendon Exposed: No Muscle Exposed: No Joint Exposed: No Bone Exposed: No n in Area: -1364.4% n in Volume: -6094.3% ization: None : No ng: No Electronic Signature(s) Signed: 04/18/2019 4:37:37 PM By: Mikeal Hawthorne EMT/HBOT Signed: 04/18/2019 5:57:16 PM By: Kela Millin Previous Signature: 04/14/2019 5:21:35 PM Version By: Kela Millin Entered By: Mikeal Hawthorne on 04/18/2019  11:00:14 -------------------------------------------------------------------------------- Vitals Details Patient Name: Date of Service: [name], [name] 04/14/2019 10:15 AM Medical Record [id number] Patient Account Number: [id number] Date of Birth/Sex: Treating RN: 04/18/1950 (69 y.o. [name] Primary Care Chawn Spraggins: [name] Other Clinician: Referring Emsley Custer: Treating Alfonso Shackett/Extender:[name], [name], [name] in Treatment: 15 Vital Signs Time Taken: 11:15 Temperature (F): 97.6 Height (in): 63 Pulse (bpm): 83 Weight (lbs): 185 Respiratory Rate (breaths/min): 18 Body Mass Index (BMI): 32.8 Blood Pressure (mmHg): 118/57 Reference Range: 80 - 120 mg / dl Electronic Signature(s) Signed: 04/14/2019 5:21:35 PM By: Kela Millin Entered By: Kela Millin on 04/14/2019 11:16:18

## 2019-04-15 NOTE — Progress Notes (Signed)
[name] ([id number]) Visit Report for 04/14/2019 Debridement Details Patient Name: Date of Service: [name], [name] 04/14/2019 10:15 AM Medical Record Number:[id number] Patient Account Number: [id number] Date of Birth/Sex: Treating RN: 1950-06-07 (69 y.o. F) Primary Care Provider: [name] Other Clinician: Referring Provider: Treating Provider/Extender:Sanyla Summey, [name], [name] in Treatment: 15 Debridement Performed for Wound #12 Left Ischial Tuberosity Assessment: Performed By: Physician Ricard Dillon., MD Debridement Type: Debridement Level of Consciousness (Pre- Awake and Alert procedure): Pre-procedure Yes - 11:40 Verification/Time Out Taken: Start Time: 11:41 Pain Control: Lidocaine 4% Topical Solution Total Area Debrided (L x W): 2 (cm) x 2.2 (cm) = 4.4 (cm) Tissue and other material Viable, Non-Viable, Slough, Subcutaneous, Fibrin/Exudate, Slough debrided: Level: Skin/Subcutaneous Tissue Debridement Description: Excisional Instrument: Curette Bleeding: Moderate Hemostasis Achieved: Pressure End Time: 11:47 Procedural Pain: 0 Post Procedural Pain: 3 Response to Treatment: Procedure was tolerated well Level of Consciousness Awake and Alert (Post-procedure): Post Debridement Measurements of Total Wound Length: (cm) 2 Stage: Category/Stage IV Width: (cm) 2.2 Depth: (cm) 3.8 Volume: (cm) 13.132 Character of Wound/Ulcer Post Improved Debridement: Post Procedure Diagnosis Same as Pre-procedure Electronic Signature(s) Signed: 04/15/2019 4:55:01 AM By: Linton Ham MD Entered By: Linton Ham on 04/14/2019 11:54:52 -------------------------------------------------------------------------------- HPI Details Patient Name: Date of Service: [name], [name] 04/14/2019 10:15 AM Medical Record [id number] Patient Account Number: [id number] Date of Birth/Sex: Treating RN: 1950/04/14 (69 y.o. F) Primary Care Provider: [name] Other  Clinician: Referring Provider: Treating Provider/Extender:Lander Eslick, [name], [name] in Treatment: 15 History of Present Illness Location: open ulceration of the left gluteal area, left heel and right ankle for about 5 months. Quality: Patient reports No Pain. Severity: Patient states wound(s) are getting worse. Duration: Patient has had the wound for > 5 months prior to seeking treatment at the wound center Context: The wound occurred when the patient has been paraplegic for about 3 years. Modifying Factors: Wound improving due to current treatment. Associated Signs and Symptoms: Patient reports having foul odor. HPI Description: this 69 year old patient who is known to have hypertension, hypothyroidism, breast cancer, chronic pain syndrome, paraplegia was noted to have a left gluteal decubitus ulcer and was brought into the hospital. During the course of her hospitalization she was debrided in the operating room by Dr. Leland Johns and had all the wounds sharply debrided. The debridement was done for the left ischial wound, the left heel wound and the right ankle wound. Bone cultures were taken at that time but were negative but clinically she was treated for osteomyelitis because of the probing down to bone and open exposed bone. Home health has been giving her antibioticss which include vancomycin and Zosyn. The patient was a smoker until about 3 weeks ago and used to smoke about 10 cigarettes a day for a long while. 12/13/2014 - details of her operative note from 11/03/2014 were reviewed -- PROCEDURE: 1. Excisional debridement skin, subcutaneous, muscle left ischium 35 cm2 2. Excisional debridement skin, subcutaneous tissue left heel 27 cm2 3. Excisional debridement right ankle skin, subcutaneous, bone 30 cm2 01/24/2015 -- she has some issues with her wheelchair cushion but other than that is doing very well and has received Podus boots for her feet. 02/14/2015 -- she was using her old  offloading boots and this seemed to have caused her a new pressure ulcer on the left posterior heel near the superior part just below the Achilles tendon. 03/07/2015 -- she has a new ulceration just to the left of the midline on her sacral region more on the  left buttock and this has been there for about a week. 08/22/2015 -- was recently admitted to hospital between May 5 and 08/13/2015, with sepsis and leukocytosis due to a UTI. she was treated for a sepsis complicating Escherichia coli UTI and kidney stones. She also had metabolic and careful up at the secondary to pyelonephritis. He received broad-spectrum antibiotics initially and then received Macrobid as per urology. She was sent home on nitrofurantoin. during her admission she had a CT scan which showed exposed left ischial tuberosity without evidence of osteolysis. 09/12/2015-- the patient is having some issues with her air mattress and would like to get a opinion from medical modalities. 10/10/2015 -- the issue with her air mattress has not yet been sorted out and the new problem seems to be a lot of odor from the wound VAC. 11/27/2015 -- the patient was admitted to the hospital between July 23 and 10/31/2015. Her problems were sepsis, osteomyelitis of the pelvic bone and acute pyelonephritis. CT of the abdomen and pelvis was consistent with a left- sided pyelonephritis with hydronephrosis and also just showed new sclerosis of the posterior portion of the left anterior pubic ramus suggestive of periosteal reaction consistent with osteomyelitis. She was treated for the osteomyelitis with infectious disease consult recommending 6 weeks of IV antibiotics including vancomycin and Rocephin and the antibiotics were to go on until 12/10/2015. He was seen by Dr. Iran Planas plastic surgery and Dr. Linus Salmons of infectious disease. She had a suprapubic catheter placed during the admission. CT scan done on 10/28/2015 showed specifically -- New sclerosis  of the posterior portion of the left inferior pubic ramus with aggressive periosteal reaction, consistent with osteomyelitis, with adjacent soft tissue gas compatible with previously described decubitus ulcer. 12/12/2015 -- she was recently seen by Dr. Linus Salmons, who noted good improvement and CRP and ESR compared to before and he has stopped her antibiotic as per plans to finish on September 4. The patient was encouraged to continue with wound care and consider hyperbaric oxygen therapy. Today she tells me that she has consented to undergo hyperbaric oxygen therapy and we can start the paperwork. 01/02/2016 -- her PCP had gained about 3 years but she still persists in having problems during hyperbaric oxygen therapy with some discomfort in the ears. 01/09/16; pressure area with underlying osteomyelitis in the left buttock. Wound bed itself has some slight amount of grayish surface slough however I do not think any debridement was necessary. There is no exposed bone soft tissue appears stable. She is using a wound VAC 01/16/16; back for weekly wound review in conjunction with HBO. She has a deep wound over the left initial tuberosity previously treated with 6 weeks of IV antibiotics for osteomyelitis. Wound bed looks reasonably healthy although the base of this is still precariously close to bone. She has been using a wound VAC. 01/23/2016 -- she has completed her course of antibiotics and this week the only new thing is her right great toe nail was avulsed and she has got an open wound over the nailbed. 01/31/16 she has completed her course of antibiotics. Her right great toenail avulsed last week and she's been using silver alginate for this as well. Still using a wound VAC to the substantial stage IV wound over the left ischial tuberosity 03/05/2016 -- the patient has had a opinion from the plastic surgery group at [facility] and details of this are not available yet but the patient's verbal  report has been heard by me. Did not  sound like there was any optimistic discussion regarding reconstruction and the net result would be to continue with the wound VAC application. I will await the official reports. Addendum: -- she was seen at [facility] by Dr. Tressa Busman. After a thorough review and from what I understand spending 45 minutes with the patient his assessment has been noted by me in detail and the management options were: 1. Continued pressure offloading and wound care versus operative procedures including wound excision 2. Soft tissue and bone sampling 3. If the wound gets larger wound closure would be done using a variety of plastic surgical techniques including but not limited to skin substitute, possible skin graft, local versus regional flaps, negative pressure dressing application. 4. He discussed with her details of flap surgery and the risks associated 5. He made a comment that since the patient was operated on by Dr. Leland Johns of [facility] plastic surgery unit in [place] the patient may continue to follow-up there for further evaluation for surgical flap closure in the future. 03/19/2016 -- the patient continues to be rather depressed and frustrated with her lack of rapid progress in healing this wound especially because she thought after hyperbaric oxygen therapy the wound would heal extremely fast. She now understands that was not the implied benefit on wound care which was the recommendation for hyperbaric oxygen therapy. I have had a lengthy discussion with the patient and her husband regarding her options: 1. Continue with collagen and wound VAC for the primary dressing and offloading and all supportive care. 2. See Dr. Iran Planas for possible placement of Acell or Integra in the OR. 3. get a second opinion from a wound care center and surrounding regions/counties 05/07/2016 -- Note from Dr. Celedonio Miyamoto,  who noted that the patient has declined flap surgery. She has discussed application of A cell, and try a few applications to see how the wound progresses. She is also recommended that we could apply products here in the wound center, like Oasis. during her preop workup it was found that her hemoglobin A1c was 11% and she has now been diagnosed as having diabetes mellitus and has been put on appropriate treatment by her PCP 05/28/2016 -- tells me her blood sugars have been doing well and she has an appointment to see her PCP in the next couple of weeks to check her hemoglobin A1c. Other than that she continues to do well. 06/25/2016 -- have not seen her back for the last month but she says her health has been about the same and she has an appointment to check the A1c next week 09/10/16 ---- was seen by Dr. Celedonio Miyamoto -- who applied Acell and saw her back in follow-up. She has recommended silver alginate to the wound every other day and cover with foam. If no significant drainage could transition to collagen every other day. She recommended discontinuing wound VAC. There were no plans to repeat application of Acell. The patient expressed that her husband could do the wound care as going to the Wound Ctr., would cost several $100 for each visit. 10/21/2016 -- her insurance company is getting her new mattress and she is pleased about that. Other than that she has been doing dressings with PolyMem Silver and has been doing very well 02/18/2017 -- she has gone through several changes of her mattress and has not been pleased with any of them. The ventricles are still working on trying to fit her with the appropriate  low air-loss mattress. She has a new wound on the gluteal area which is clearly separated from the original wound. 03/25/17-she is here in follow-up evaluation for her left ischialpressure ulcer. She remains unsatisfied with her pressure mattress. She admits to sitting multiple hours a  day, in the bed. We have discussed offloading options. The wound does not appear infected. Nutrition does not appear to be a concern. Will follow-up in 4 weeks, if wound continues to be stalled may consider x-ray to evaluate for refractory osteomyelitis. 04/21/17; this is a patient that I don't know all that well. She has a chronic wound which at one point had underlying osteomyelitis in the left ischial tuberosity. This is a stage IV pressure ulcer. Over the last 3 months she has a stage II wound inferiorly to the original wound. The last time she was here her dressing was changed to silver collagen although the patient's husband who changes the dressing said that the collagen stuck to the wound and remove skin from the superficial area therefore he switched back to Badger 05/13/17; this is a patient we've been following for a left ischial tuberosity wound which was stage IV at one point had underlying osteomyelitis. Over the last several months she's had a stage II wound just inferior and medial to the related to the wound. According to her husband he is using Endoform layer with collagen although this is not what I had last time. According to her husband they are using Elgie Congo with collagen although I don't quite know how that started. She was hospitalized from 1/20 through 04/30/16. This was related to a UTI. Her blood cultures were negative, urine culture showed multiple species. She did have a CT scan of the abdomen and pelvis which documented chronic osteomyelitis in the area of the wound inflammatory markers were unremarkable. She has had prior knowledge of osteomyelitis. It looks as though she received IV antibiotics in 2017 and was treated with a course of hyperbaric oxygen. 05/28/17; the wound over the left ischial tuberosity is deeper today and abuts clearly on bone. Nursing intake reported drainage. I therefore culture of the wound. The more superficial area just below this  looks about the same. They once again complained that there are mattress cover is not working although apparently [facility] is been noted to see this many times in the report is that the device is functional 06/18/17; the patient had a probing area on the left ischial tuberosity that was draining purulent fluid last time. This also clearly seemed to have open bone. Culture I did showed pansensitive pseudomonas including third generation cephalosporins. I treated this with cefdinir 300 twice a day for 10 days and things seem to have improved. She has a more superficial wound just underneath this area. Amazingly she has a new air mattress through [facility]. I think they gave this to her as a parking give. In any case this now works according to the patient may have something to do with why the areas are looking better. 07/09/17; the patient has a probing area in the left ischial tuberosity that still has some depth. However this is contracted in terms of the wound orifice although the depth is still roughly the same. There is no undermining. She also has the satellite wound which is more superficial. This appears to have a healthy surface we've been using silver collagen 08/06/17; the patient's wound is over the left ischial tuberosity and a satellite lesion just underneath this. The  original wound was actually a deep stage 4 wound. We have made good progress in 2 months and there is no longer exposed bone here. 09/03/17; left ischial tuberosity actually appears to be quite healthy. I think we are making progress. No debridement is required. There is no surrounding erythema 10/01/17 I follow this patient monthly for her left ischial tuberosity wound. There is 2 areas the original area and a satellite area. The satellite area looks a lot better there is no surrounding erythema. Her husband relates that he is having trouble maintaining the dressing. This has to do with the soft tissue  around it. He states he puts the collagen in but he cannot make sure that it stays in even with the ABD pads and tape that he is been using 10/29/17; patient arrives with a better looking noon today. Some of the satellite lesions have closed. using Prisma 11/26/17; the patient has a large cone-shaped area with the tip of the Cone deep within her buttock soft tissue. The walls of the Cone are epithelialized however the base is still open. The area at the base of this looks moist we've been using silver collagen. Will change to silver alginate 12/31/2017; the wound appears to have come in fairly nicely. Using silver alginate. There is no surrounding maceration or infection 01/28/18; there is still an open area here over the left initial tuberosity. Base of this however looks healthy. There is no surrounding infection 02/25/18; the area of its open is over the left ischial tuberosity. The base of this is where the wound is. This is a large inverted cone-shaped area with the wound at the tip. Dimensions of the wound at the tip are improved. There is a area of denuded skin about halfway towards the tip which her husband thinks may have happened today when he was bathing her. 04/20/17; the area is still open over the left initial tuberosity. 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 This is now completely closed over although it is closed over with a divot and skin over bone at the base of this. Continued aggressive offloading will be necessary. 12/30/2018 READMISSION This is a 69 year old woman with chronic paraplegia. I picked her up for her care from Dr. Con Memos in this clinic after he departed. She had a stage IV pressure wound over the left ischial tuberosity. She was treated twice for her underlying  osteomyelitis and this I believe firstly in 2016 and again in 2017. There were some plans at some point for flap closure of this however she was discovered to have uncontrolled diabetes and I do not think this was ever accomplished. She ultimately healed over in this clinic and was discharged in May. She has a large cone-shaped indentation with the tip of this going towards the left ischial tuberosity. It is not an easy area to examine but at that time I thought all of this was epithelialized. Apparently there was a reopening here shortly after she left the clinic last time. She was admitted to hospital at the end of June for Klebsiella bacteremia felt to be secondary to UTI. A CT scan of the pelvis is listed below and there was initially some concern that she had underlying osteomyelitis although I believe she was seen by infectious disease and that was felt  to be not the case: I do not see any new cultures or inflammatory markers IMPRESSION: 1. No CT evidence for acute intra-abdominal or pelvic abnormality. Large volume of stool throughout the colon. 2. Enlarged fatty liver with fat sparing near the gallbladder fossa 3. Cortical scarring right kidney. Bilateral intrarenal stones without hydronephrosis. Thick-walled urinary bladder decompressed by suprapubic catheter 4. Deep left decubitus ulcer with underlying left ischial changes suggesting osteomyelitis. Her husband has been using silver collagen in the wound. She has not been systemically unwell no fever chills eating and drinking well. They rigorously offload this wound only getting up in the wheelchair when she is going to appointments the rest of the time she is in bed. 10/8; wound measures larger and she now has exposed bone. We have been using silver alginate 11/12 still using silver alginate. The patient saw Dr. Megan Salon of infectious disease. She was started on Augmentin 500 mg twice daily. She is due to follow-up with Dr. Megan Salon  I believe next week. Lab work Dr. Megan Salon requested showed a sedimentation rate of 28 and CRP of 20 although her CRP 1 year ago was 18.8. Sedimentation rate 1 year ago was 11 basic metabolic panel showed a creatinine of 1.12 12/3; the patient followed up with Dr. Megan Salon yesterday. She is still on Augmentin twice daily. This was directed by Dr. Megan Salon. The patient's inflammatory markers have improved which is gratifying. Her C-reactive protein was repeated yesterday and follow-up booked with infectious disease in January. In addition I have been getting secure text messages I think from palliative care through [facility] [employer]. I think they were hoping to provide services to the patient in her home. They could not get a hold of the primary physician and so they reached out to me on 2 separate occasions. 12/17; patient last saw Dr. Megan Salon on 12/2. She is finishing up with Augmentin. Her C-reactive protein was 20 on 10/21, 10.1 on 11/19 and 17 on 12/2. The wound itself still has depth and undermining. We are using Santyl with the backing wet-to-dry 04/27/2019. The wound is gradually clearing up in terms of the surface although it is not filled in that much. Still abuts right against bone Electronic Signature(s) Signed: 04/15/2019 4:55:01 AM By: Linton Ham MD Entered By: Linton Ham on 04/14/2019 11:57:19 -------------------------------------------------------------------------------- Physical Exam Details Patient Name: Date of Service: [name], [name] 04/14/2019 10:15 AM Medical Record [id number] Patient Account Number: [id number] Date of Birth/Sex: Treating RN: Feb 10, 1951 (69 y.o. F) Primary Care Provider: [name] Other Clinician: Referring Provider: Treating Provider/Extender:Boneta Standre, [name], [name] in Treatment: 15 Constitutional Sitting or standing Blood Pressure is within target range for patient.. Pulse regular and within target range  for patient.Marland Kitchen Respirations regular, non-labored and within target range.. Temperature is normal and within the target range for the patient.Marland Kitchen Appears in no distress. Notes Wound exam; left ischial tuberosity. Necrotic debris at the base of the wound removed with a #5 curette. Actually I think there is less of this. There is no surrounding infection in the soft tissue no crepitus no palpable tenderness. Electronic Signature(s) Signed: 04/15/2019 4:55:01 AM By: Linton Ham MD Entered By: Linton Ham on 04/14/2019 11:58:19 -------------------------------------------------------------------------------- Physician Orders Details Patient Name: Date of Service: [name], [name] 04/14/2019 10:15 AM Medical Record [id number] Patient Account Number: [id number] Date of Birth/Sex: Treating RN: [birth year] (69 y.o. [name], [name] Primary Care Provider: [name] Other Clinician: Referring Provider: Treating Provider/Extender:Natalie Mceuen, [name], [name] in Treatment: 15 Verbal / Phone  Orders: No Diagnosis Coding ICD-10 Coding Code Description 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, stage 4 G82.20 Paraplegia, unspecified E11.622 Type 2 diabetes mellitus with other skin ulcer M86.68 Other chronic osteomyelitis, other site Follow-up Appointments Return appointment in 1 month. Dressing Change Frequency Change dressing every day. Skin Barriers/Peri-Wound Care Skin Prep - to periwound. Barrier cream - as needed for maceration, wetness, or redness to periwound. Wound Cleansing Wound #12 Left Ischial Tuberosity May shower and wash wound with soap and water. - or wound cleanser Primary Wound Dressing Wound #12 Left Ischial Tuberosity Silver Collagen - apply to wound bed moisten with hydrogel. back with saline or hydrogel moisten gauze. back with dry gauze. Secondary Dressing Wound #12 Left Ischial Tuberosity ABD pad - or bordered foam. Off-Loading Low air-loss mattress (Group 2)  - continue to use Gel wheelchair cushion - continue to use specialty wheelchair cushion. Turn and reposition every 2 hours Electronic Signature(s) Signed: 04/14/2019 5:19:45 PM By: Deon Pilling Signed: 04/15/2019 4:55:01 AM By: Linton Ham MD Entered By: Deon Pilling on 04/14/2019 11:49:22 -------------------------------------------------------------------------------- Problem List Details Patient Name: Date of Service: [name], [name] 04/14/2019 10:15 AM Medical Record [id number] Patient Account Number: [id number] Date of Birth/Sex: Treating RN: 1950-10-13 (69 y.o. F) Primary Care Provider: [name] Other Clinician: Referring Provider: Treating Provider/Extender:Mahalie Kanner, [name], [name] in Treatment: 15 Active Problems ICD-10 Evaluated Encounter Code Description Active Date Today Diagnosis 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, stage 4 12/30/2018 No Yes G82.20 Paraplegia, unspecified 12/30/2018 No Yes E11.622 Type 2 diabetes mellitus with other skin ulcer 12/30/2018 No Yes M86.68 Other chronic osteomyelitis, other site 02/17/2019 No Yes Inactive Problems Resolved Problems Electronic Signature(s) Signed: 04/15/2019 4:55:01 AM By: Linton Ham MD Entered By: Linton Ham on 04/14/2019 11:54:35 -------------------------------------------------------------------------------- Progress Note Details Patient Name: Date of Service: [name], [name] 04/14/2019 10:15 AM Medical Record [id number] Patient Account Number: [id number] Date of Birth/Sex: Treating RN: [birth year] (69 y.o. F) Primary Care Provider: [name] Other Clinician: Referring Provider: Treating Provider/Extender:Hephzibah Strehle, [name], [name] in Treatment: 15 Subjective History of Present Illness (HPI) The following HPI elements were documented for the patient's wound: Location: open ulceration of the left gluteal area, left heel and right ankle for about 5 months. Quality: Patient reports No  Pain. Severity: Patient states wound(s) are getting worse. Duration: Patient has had the wound for > 5 months prior to seeking treatment at the wound center Context: The wound occurred when the patient has been paraplegic for about 3 years. Modifying Factors: Wound improving due to current treatment. Associated Signs and Symptoms: Patient reports having foul odor. this 69 year old patient who is known to have hypertension, hypothyroidism, breast cancer, chronic pain syndrome, paraplegia was noted to have a left gluteal decubitus ulcer and was brought into the hospital. During the course of her hospitalization she was debrided in the operating room by Dr. Leland Johns and had all the wounds sharply debrided. The debridement was done for the left ischial wound, the left heel wound and the right ankle wound. Bone cultures were taken at that time but were negative but clinically she was treated for osteomyelitis because of the probing down to bone and open exposed bone. Home health has been giving her antibioticss which include vancomycin and Zosyn. The patient was a smoker until about 3 weeks ago and used to smoke about 10 cigarettes a day for a long while. 12/13/2014 - details of her operative note from 11/03/2014 were reviewed -- PROCEDURE: 1. Excisional debridement skin, subcutaneous, muscle left ischium 35 cm2  2. Excisional debridement skin, subcutaneous tissue left heel 27 cm2 3. Excisional debridement right ankle skin, subcutaneous, bone 30 cm2 01/24/2015 -- she has some issues with her wheelchair cushion but other than that is doing very well and has received Podus boots for her feet. 02/14/2015 -- she was using her old offloading boots and this seemed to have caused her a new pressure ulcer on the left posterior heel near the superior part just below the Achilles tendon. 03/07/2015 -- she has a new ulceration just to the left of the midline on her sacral region more on the left buttock and  this has been there for about a week. 08/22/2015 -- was recently admitted to hospital between May 5 and 08/13/2015, with sepsis and leukocytosis due to a UTI. she was treated for a sepsis complicating Escherichia coli UTI and kidney stones. She also had metabolic and careful up at the secondary to pyelonephritis. He received broad-spectrum antibiotics initially and then received Macrobid as per urology. She was sent home on nitrofurantoin. during her admission she had a CT scan which showed exposed left ischial tuberosity without evidence of osteolysis. 09/12/2015-- the patient is having some issues with her air mattress and would like to get a opinion from medical modalities. 10/10/2015 -- the issue with her air mattress has not yet been sorted out and the new problem seems to be a lot of odor from the wound VAC. 11/27/2015 -- the patient was admitted to the hospital between July 23 and 10/31/2015. Her problems were sepsis, osteomyelitis of the pelvic bone and acute pyelonephritis. CT of the abdomen and pelvis was consistent with a left- sided pyelonephritis with hydronephrosis and also just showed new sclerosis of the posterior portion of the left anterior pubic ramus suggestive of periosteal reaction consistent with osteomyelitis. She was treated for the osteomyelitis with infectious disease consult recommending 6 weeks of IV antibiotics including vancomycin and Rocephin and the antibiotics were to go on until 12/10/2015. He was seen by Dr. Iran Planas plastic surgery and Dr. Linus Salmons of infectious disease. She had a suprapubic catheter placed during the admission. CT scan done on 10/28/2015 showed specifically -- New sclerosis of the posterior portion of the left inferior pubic ramus with aggressive periosteal reaction, consistent with osteomyelitis, with adjacent soft tissue gas compatible with previously described decubitus ulcer. 12/12/2015 -- she was recently seen by Dr. Linus Salmons, who noted good  improvement and CRP and ESR compared to before and he has stopped her antibiotic as per plans to finish on September 4. The patient was encouraged to continue with wound care and consider hyperbaric oxygen therapy. Today she tells me that she has consented to undergo hyperbaric oxygen therapy and we can start the paperwork. 01/02/2016 -- her PCP had gained about 3 years but she still persists in having problems during hyperbaric oxygen therapy with some discomfort in the ears. 01/09/16; pressure area with underlying osteomyelitis in the left buttock. Wound bed itself has some slight amount of grayish surface slough however I do not think any debridement was necessary. There is no exposed bone soft tissue appears stable. She is using a wound VAC 01/16/16; back for weekly wound review in conjunction with HBO. She has a deep wound over the left initial tuberosity previously treated with 6 weeks of IV antibiotics for osteomyelitis. Wound bed looks reasonably healthy although the base of this is still precariously close to bone. She has been using a wound VAC. 01/23/2016 -- she has completed her course of antibiotics and  this week the only new thing is her right great toe nail was avulsed and she has got an open wound over the nailbed. 01/31/16 she has completed her course of antibiotics. Her right great toenail avulsed last week and she's been using silver alginate for this as well. Still using a wound VAC to the substantial stage IV wound over the left ischial tuberosity 03/05/2016 -- the patient has had a opinion from the plastic surgery group at [facility] and details of this are not available yet but the patient's verbal report has been heard by me. Did not sound like there was any optimistic discussion regarding reconstruction and the net result would be to continue with the wound VAC application. I will await the official reports. Addendum: -- she was seen at [facility]  surgery service by Dr. Tressa Busman. After a thorough review and from what I understand spending 45 minutes with the patient his assessment has been noted by me in detail and the management options were: 1. Continued pressure offloading and wound care versus operative procedures including wound excision 2. Soft tissue and bone sampling 3. If the wound gets larger wound closure would be done using a variety of plastic surgical techniques including but not limited to skin substitute, possible skin graft, local versus regional flaps, negative pressure dressing application. 4. He discussed with her details of flap surgery and the risks associated 5. He made a comment that since the patient was operated on by Dr. Leland Johns of [facility] plastic surgery unit in [place] the patient may continue to follow-up there for further evaluation for surgical flap closure in the future. 03/19/2016 -- the patient continues to be rather depressed and frustrated with her lack of rapid progress in healing this wound especially because she thought after hyperbaric oxygen therapy the wound would heal extremely fast. She now understands that was not the implied benefit on wound care which was the recommendation for hyperbaric oxygen therapy. I have had a lengthy discussion with the patient and her husband regarding her options: 1. Continue with collagen and wound VAC for the primary dressing and offloading and all supportive care. 2. See Dr. Iran Planas for possible placement of Acell or Integra in the OR. 3. get a second opinion from a wound care center and surrounding regions/counties 05/07/2016 -- Note from Dr. Celedonio Miyamoto, who noted that the patient has declined flap surgery. She has discussed application of A cell, and try a few applications to see how the wound progresses. She is also recommended that we could apply products here in the wound center, like Oasis. during her preop workup it was  found that her hemoglobin A1c was 11% and she has now been diagnosed as having diabetes mellitus and has been put on appropriate treatment by her PCP 05/28/2016 -- tells me her blood sugars have been doing well and she has an appointment to see her PCP in the next couple of weeks to check her hemoglobin A1c. Other than that she continues to do well. 06/25/2016 -- have not seen her back for the last month but she says her health has been about the same and she has an appointment to check the A1c next week 09/10/16 ---- was seen by Dr. Celedonio Miyamoto -- who applied Acell and saw her back in follow-up. She has recommended silver alginate to the wound every other day and cover with foam. If no significant drainage could transition to collagen every other day. She recommended  discontinuing wound VAC. There were no plans to repeat application of Acell. The patient expressed that her husband could do the wound care as going to the Wound Ctr., would cost several $100 for each visit. 10/21/2016 -- her insurance company is getting her new mattress and she is pleased about that. Other than that she has been doing dressings with PolyMem Silver and has been doing very well 02/18/2017 -- she has gone through several changes of her mattress and has not been pleased with any of them. The ventricles are still working on trying to fit her with the appropriate low air-loss mattress. She has a new wound on the gluteal area which is clearly separated from the original wound. 03/25/17-she is here in follow-up evaluation for her left ischialpressure ulcer. She remains unsatisfied with her pressure mattress. She admits to sitting multiple hours a day, in the bed. We have discussed offloading options. The wound does not appear infected. Nutrition does not appear to be a concern. Will follow-up in 4 weeks, if wound continues to be stalled may consider x-ray to evaluate for refractory osteomyelitis. 04/21/17; this is a  patient that I don't know all that well. She has a chronic wound which at one point had underlying osteomyelitis in the left ischial tuberosity. This is a stage IV pressure ulcer. Over the last 3 months she has a stage II wound inferiorly to the original wound. The last time she was here her dressing was changed to silver collagen although the patient's husband who changes the dressing said that the collagen stuck to the wound and remove skin from the superficial area therefore he switched back to Brenton 05/13/17; this is a patient we've been following for a left ischial tuberosity wound which was stage IV at one point had underlying osteomyelitis. Over the last several months she's had a stage II wound just inferior and medial to the related to the wound. According to her husband he is using Endoform layer with collagen although this is not what I had last time. According to her husband they are using Elgie Congo with collagen although I don't quite know how that started. She was hospitalized from 1/20 through 04/30/16. This was related to a UTI. Her blood cultures were negative, urine culture showed multiple species. She did have a CT scan of the abdomen and pelvis which documented chronic osteomyelitis in the area of the wound inflammatory markers were unremarkable. She has had prior knowledge of osteomyelitis. It looks as though she received IV antibiotics in 2017 and was treated with a course of hyperbaric oxygen. 05/28/17; the wound over the left ischial tuberosity is deeper today and abuts clearly on bone. Nursing intake reported drainage. I therefore culture of the wound. The more superficial area just below this looks about the same. They once again complained that there are mattress cover is not working although apparently [facility] is been noted to see this many times in the report is that the device is functional 06/18/17; the patient had a probing area on the left ischial  tuberosity that was draining purulent fluid last time. This also clearly seemed to have open bone. Culture I did showed pansensitive pseudomonas including third generation cephalosporins. I treated this with cefdinir 300 twice a day for 10 days and things seem to have improved. She has a more superficial wound just underneath this area. Amazingly she has a new air mattress through [facility]. I think they gave this to her as a parking  give. In any case this now works according to the patient may have something to do with why the areas are looking better. 07/09/17; the patient has a probing area in the left ischial tuberosity that still has some depth. However this is contracted in terms of the wound orifice although the depth is still roughly the same. There is no undermining. ooShe also has the satellite wound which is more superficial. This appears to have a healthy surface we've been using silver collagen 08/06/17; the patient's wound is over the left ischial tuberosity and a satellite lesion just underneath this. The original wound was actually a deep stage 4 wound. We have made good progress in 2 months and there is no longer exposed bone here. 09/03/17; left ischial tuberosity actually appears to be quite healthy. I think we are making progress. No debridement is required. There is no surrounding erythema 10/01/17 I follow this patient monthly for her left ischial tuberosity wound. There is 2 areas the original area and a satellite area. The satellite area looks a lot better there is no surrounding erythema. Her husband relates that he is having trouble maintaining the dressing. This has to do with the soft tissue around it. He states he puts the collagen in but he cannot make sure that it stays in even with the ABD pads and tape that he is been using 10/29/17; patient arrives with a better looking noon today. Some of the satellite lesions have closed. using Prisma 11/26/17; the patient  has a large cone-shaped area with the tip of the Cone deep within her buttock soft tissue. The walls of the Cone are epithelialized however the base is still open. The area at the base of this looks moist we've been using silver collagen. Will change to silver alginate 12/31/2017; the wound appears to have come in fairly nicely. Using silver alginate. There is no surrounding maceration or infection 01/28/18; there is still an open area here over the left initial tuberosity. Base of this however looks healthy. There is no surrounding infection 02/25/18; the area of its open is over the left ischial tuberosity. The base of this is where the wound is. This is a large inverted cone-shaped area with the wound at the tip. Dimensions of the wound at the tip are improved. There is a area of denuded skin about halfway towards the tip which her husband thinks may have happened today when he was bathing her. 04/20/17; the area is still open over the left initial tuberosity. 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 This is now completely closed over although it is closed over with a divot and skin over bone at the base of this. Continued aggressive offloading will be necessary. 12/30/2018 READMISSION This is a 69 year old woman with chronic paraplegia. I picked her up for her care from Dr. Con Memos in this clinic after he departed. She had a stage IV pressure wound over the left ischial tuberosity. She was treated twice for her underlying osteomyelitis and this I believe firstly in 2016 and again in 2017. There were some plans at some point for flap closure of this however she was discovered to have uncontrolled diabetes and I do not think this was ever accomplished. She ultimately healed over in this clinic and was  discharged in May.  She has a large cone-shaped indentation with the tip of this going towards the left ischial tuberosity. It is not an easy area to examine but at that time I thought all of this was epithelialized. Apparently there was a reopening here shortly after she left the clinic last time. She was admitted to hospital at the end of June for Klebsiella bacteremia felt to be secondary to UTI. A CT scan of the pelvis is listed below and there was initially some concern that she had underlying osteomyelitis although I believe she was seen by infectious disease and that was felt to be not the case: I do not see any new cultures or inflammatory markers IMPRESSION: 1. No CT evidence for acute intra-abdominal or pelvic abnormality. Large volume of stool throughout the colon. 2. Enlarged fatty liver with fat sparing near the gallbladder fossa 3. Cortical scarring right kidney. Bilateral intrarenal stones without hydronephrosis. Thick-walled urinary bladder decompressed by suprapubic catheter 4. Deep left decubitus ulcer with underlying left ischial changes suggesting osteomyelitis. Her husband has been using silver collagen in the wound. She has not been systemically unwell no fever chills eating and drinking well. They rigorously offload this wound only getting up in the wheelchair when she is going to appointments the rest of the time she is in bed. 10/8; wound measures larger and she now has exposed bone. We have been using silver alginate 11/12 still using silver alginate. The patient saw Dr. Megan Salon of infectious disease. She was started on Augmentin 500 mg twice daily. She is due to follow-up with Dr. Megan Salon I believe next week. Lab work Dr. Megan Salon requested showed a sedimentation rate of 28 and CRP of 20 although her CRP 1 year ago was 18.8. Sedimentation rate 1 year ago was 11 basic metabolic panel showed a creatinine of 1.12 12/3; the patient followed up with Dr. Megan Salon  yesterday. She is still on Augmentin twice daily. This was directed by Dr. Megan Salon. The patient's inflammatory markers have improved which is gratifying. Her C-reactive protein was repeated yesterday and follow-up booked with infectious disease in January. In addition I have been getting secure text messages I think from palliative care through [facility] [employer]. I think they were hoping to provide services to the patient in her home. They could not get a hold of the primary physician and so they reached out to me on 2 separate occasions. 12/17; patient last saw Dr. Megan Salon on 12/2. She is finishing up with Augmentin. Her C-reactive protein was 20 on 10/21, 10.1 on 11/19 and 17 on 12/2. The wound itself still has depth and undermining. We are using Santyl with the backing wet-to-dry 04/27/2019. The wound is gradually clearing up in terms of the surface although it is not filled in that much. Still abuts right against bone Objective Constitutional Sitting or standing Blood Pressure is within target range for patient.. Pulse regular and within target range for patient.Marland Kitchen Respirations regular, non-labored and within target range.. Temperature is normal and within the target range for the patient.Marland Kitchen Appears in no distress. Vitals Time Taken: 11:15 AM, Height: 63 in, Weight: 185 lbs, BMI: 32.8, Temperature: 97.6 F, Pulse: 83 bpm, Respiratory Rate: 18 breaths/min, Blood Pressure: 118/57 mmHg. General Notes: Wound exam; left ischial tuberosity. Necrotic debris at the base of the wound removed with a #5 curette. Actually I think there is less of this. There is no surrounding infection in the soft tissue no crepitus no palpable tenderness. Integumentary (Hair, Skin) Wound #12  status is Open. Original cause of wound was Pressure Injury. The wound is located on the Left Ischial Tuberosity. The wound measures 2cm length x 2.2cm width x 3.8cm depth; 3.456cm^2 area and  13.132cm^3 volume. There is Fat Layer (Subcutaneous Tissue) Exposed exposed. There is no tunneling or undermining noted. There is a medium amount of serosanguineous drainage noted. The wound margin is well defined and not attached to the wound base. There is medium (34-66%) pink granulation within the wound bed. There is a medium (34-66%) amount of necrotic tissue within the wound bed including Adherent Slough. Assessment Active Problems ICD-10 Pressure ulcer of left buttock, stage 4 Paraplegia, unspecified Type 2 diabetes mellitus with other skin ulcer Other chronic osteomyelitis, other site Procedures Wound #12 Pre-procedure diagnosis of Wound #12 is a Pressure Ulcer located on the Left Ischial Tuberosity . There was a Excisional Skin/Subcutaneous Tissue Debridement with a total area of 4.4 sq cm performed by Ricard Dillon., MD. With the following instrument(s): Curette to remove Viable and Non-Viable tissue/material. Material removed includes Subcutaneous Tissue, Slough, and Fibrin/Exudate after achieving pain control using Lidocaine 4% Topical Solution. A time out was conducted at 11:40, prior to the start of the procedure. A Moderate amount of bleeding was controlled with Pressure. The procedure was tolerated well with a pain level of 0 throughout and a pain level of 3 following the procedure. Post Debridement Measurements: 2cm length x 2.2cm width x 3.8cm depth; 13.132cm^3 volume. Post debridement Stage noted as Category/Stage IV. Character of Wound/Ulcer Post Debridement is improved. Post procedure Diagnosis Wound #12: Same as Pre-Procedure Plan Follow-up Appointments: Return appointment in 1 month. Dressing Change Frequency: Change dressing every day. Skin Barriers/Peri-Wound Care: Skin Prep - to periwound. Barrier cream - as needed for maceration, wetness, or redness to periwound. Wound Cleansing: Wound #12 Left Ischial Tuberosity: May shower and wash wound with soap  and water. - or wound cleanser Primary Wound Dressing: Wound #12 Left Ischial Tuberosity: Silver Collagen - apply to wound bed moisten with hydrogel. back with saline or hydrogel moisten gauze. back with dry gauze. Secondary Dressing: Wound #12 Left Ischial Tuberosity: ABD pad - or bordered foam. Off-Loading: Low air-loss mattress (Group 2) - continue to use Gel wheelchair cushion - continue to use specialty wheelchair cushion. Turn and reposition every 2 hours 1. Deep stage IV wound over the left ischial tuberosity. I think the wound bed is cleaned up somewhat. 2. I do not believe that this wound would be easy to a wound VAC for home health nurse. This is down in depth. 3. I Minna base this with hydrogel moistened silver collagen wet-to-dry. We will see her back in a month Electronic Signature(s) Signed: 04/15/2019 4:55:01 AM By: Linton Ham MD Entered By: Linton Ham on 04/14/2019 11:59:32 -------------------------------------------------------------------------------- SuperBill Details Patient Name: Date of Service: [name], [name] 04/14/2019 Medical Record Number:[id number] Patient Account Number: [id number] Date of Birth/Sex: Treating RN: May 31, 1950 (69 y.o. [name], [name] Primary Care Provider: [name] Other Clinician: Referring Provider: Treating Provider/Extender:Mercedies Ganesh, [name], [name] in Treatment: 15 Diagnosis Coding ICD-10 Codes Code Description [phone] Pressure ulcer of left buttock, stage 4 G82.20 Paraplegia, unspecified E11.622 Type 2 diabetes mellitus with other skin ulcer M86.68 Other chronic osteomyelitis, other site Facility Procedures The patient participates with Medicare or their insurance follows the Medicare Facility Guidelines: CPT4 Code Description Modifier Quantity [id number] 11042 - DEB SUBQ TISSUE 20 SQ CM/< 1 ICD-10 Diagnosis Description 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

## 2019-04-27 ENCOUNTER — Ambulatory Visit: Payer: PPO | Admitting: Internal Medicine

## 2019-05-12 ENCOUNTER — Other Ambulatory Visit: Payer: Self-pay

## 2019-05-12 ENCOUNTER — Encounter (HOSPITAL_BASED_OUTPATIENT_CLINIC_OR_DEPARTMENT_OTHER): Payer: Medicare Other | Attending: Internal Medicine | Admitting: Internal Medicine

## 2019-05-12 DIAGNOSIS — E11622 Type 2 diabetes mellitus with other skin ulcer: Secondary | ICD-10-CM | POA: Insufficient documentation

## 2019-05-12 DIAGNOSIS — L89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, stage 4: Secondary | ICD-10-CM | POA: Diagnosis not present

## 2019-05-12 DIAGNOSIS — G8220 Paraplegia, unspecified: Secondary | ICD-10-CM | POA: Diagnosis not present

## 2019-05-12 DIAGNOSIS — E1151 Type 2 diabetes mellitus with diabetic peripheral angiopathy without gangrene: Secondary | ICD-10-CM | POA: Diagnosis not present

## 2019-05-12 DIAGNOSIS — N200 Calculus of kidney: Secondary | ICD-10-CM | POA: Insufficient documentation

## 2019-05-12 DIAGNOSIS — M1990 Unspecified osteoarthritis, unspecified site: Secondary | ICD-10-CM | POA: Diagnosis not present

## 2019-05-12 DIAGNOSIS — I10 Essential (primary) hypertension: Secondary | ICD-10-CM | POA: Insufficient documentation

## 2019-05-12 DIAGNOSIS — F0390 Unspecified dementia without behavioral disturbance: Secondary | ICD-10-CM | POA: Insufficient documentation

## 2019-05-12 DIAGNOSIS — K760 Fatty (change of) liver, not elsewhere classified: Secondary | ICD-10-CM | POA: Diagnosis not present

## 2019-05-12 NOTE — Progress Notes (Signed)
[name] ([id number]) Visit Report for 05/12/2019 HPI Details Patient Name: Date of Service: [name] 05/12/2019 10:00 AM Medical Record Number:[id number] Patient Account Number: [id number] Date of Birth/Sex: Treating RN: 1951/02/27 (69 y.o. [name] Primary Care Provider: Sandi [name] Other Clinician: Referring Provider: Treating Provider/Extender:[name], [name], [name] in Treatment: 19 History of Present Illness Location: open ulceration of the left gluteal area, left heel and right ankle for about 5 months. Quality: Patient reports No Pain. Severity: Patient states wound(s) are getting worse. Duration: Patient has had the wound for > 5 months prior to seeking treatment at the wound center Context: The wound occurred when the patient has been paraplegic for about 3 years. Modifying Factors: Wound improving due to current treatment. Associated Signs and Symptoms: Patient reports having foul odor. HPI Description: this 69 year old patient who is known to have hypertension, hypothyroidism, breast cancer, chronic pain syndrome, paraplegia was noted to have a left gluteal decubitus ulcer and was brought into the hospital. During the course of her hospitalization she was debrided in the operating room by [name] and had all the wounds sharply debrided. The debridement was done for the left ischial wound, the left heel wound and the right ankle wound. Bone cultures were taken at that time but were negative but clinically she was treated for osteomyelitis because of the probing down to bone and open exposed bone. Home health has been giving her antibioticss which include vancomycin and Zosyn. The patient was a smoker until about 3 weeks ago and used to smoke about 10 cigarettes a day for a long while. 12/13/2014 - details of her operative note from 11/03/2014 were reviewed -- PROCEDURE: 1. Excisional debridement skin, subcutaneous, muscle left ischium 35 cm2 2.  Excisional debridement skin, subcutaneous tissue left heel 27 cm2 3. Excisional debridement right ankle skin, subcutaneous, bone 30 cm2 01/24/2015 -- she has some issues with her wheelchair cushion but other than that is doing very well and has received Podus boots for her feet. 02/14/2015 -- she was using her old offloading boots and this seemed to have caused her a new pressure ulcer on the left posterior heel near the superior part just below the Achilles tendon. 03/07/2015 -- she has a new ulceration just to the left of the midline on her sacral region more on the left buttock and this has been there for about a week. 08/22/2015 -- was recently admitted to hospital between May 5 and 08/13/2015, with sepsis and leukocytosis due to a UTI. she was treated for a sepsis complicating Escherichia coli UTI and kidney stones. She also had metabolic and careful up at the secondary to pyelonephritis. He received broad-spectrum antibiotics initially and then received Macrobid as per urology. She was sent home on nitrofurantoin. during her admission she had a CT scan which showed exposed left ischial tuberosity without evidence of osteolysis. 09/12/2015-- the patient is having some issues with her air mattress and would like to get a opinion from medical modalities. 10/10/2015 -- the issue with her air mattress has not yet been sorted out and the new problem seems to be a lot of odor from the wound VAC. 11/27/2015 -- the patient was admitted to the hospital between July 23 and 10/31/2015. Her problems were sepsis, osteomyelitis of the pelvic bone and acute pyelonephritis. CT of the abdomen and pelvis was consistent with a left- sided pyelonephritis with hydronephrosis and also just showed new sclerosis of the posterior portion of the left anterior pubic ramus suggestive of  periosteal reaction consistent with osteomyelitis. She was treated for the osteomyelitis with infectious disease consult recommending  6 weeks of IV antibiotics including vancomycin and Rocephin and the antibiotics were to go on until 12/10/2015. He was seen by [name] plastic surgery and [name] of infectious disease. She had a suprapubic catheter placed during the admission. CT scan done on 10/28/2015 showed specifically -- New sclerosis of the posterior portion of the left inferior pubic ramus with aggressive periosteal reaction, consistent with osteomyelitis, with adjacent soft tissue gas compatible with previously described decubitus ulcer. 12/12/2015 -- she was recently seen by [name], who noted good improvement and CRP and ESR compared to before and he has stopped her antibiotic as per plans to finish on September 4. The patient was encouraged to continue with wound care and consider hyperbaric oxygen therapy. Today she tells me that she has consented to undergo hyperbaric oxygen therapy and we can start the paperwork. 01/02/2016 -- her PCP had gained about 71 years but she still persists in having problems during hyperbaric oxygen therapy with some discomfort in the ears. 01/09/16; pressure area with underlying osteomyelitis in the left buttock. Wound bed itself has some slight amount of grayish surface slough however I do not think any debridement was necessary. There is no exposed bone soft tissue appears stable. She is using a wound VAC 01/16/16; back for weekly wound review in conjunction with HBO. She has a deep wound over the left initial tuberosity previously treated with 6 weeks of IV antibiotics for osteomyelitis. Wound bed looks reasonably healthy although the base of this is still precariously close to bone. She has been using a wound VAC. 01/23/2016 -- she has completed her course of antibiotics and this week the only new thing is her right great toe nail was avulsed and she has got an open wound over the nailbed. 01/31/16 she has completed her course of antibiotics. Her right great toenail avulsed  last week and she's been using silver alginate for this as well. Still using a wound VAC to the substantial stage IV wound over the left ischial tuberosity 03/05/2016 -- the patient has had a opinion from the plastic surgery group at [facility] and details of this are not available yet but the patient's verbal report has been heard by me. Did not sound like there was any optimistic discussion regarding reconstruction and the net result would be to continue with the wound VAC application. I will await the official reports. Addendum: -- she was seen at [facility] by Dr. Tressa Busman. After a thorough review and from what I understand spending 45 minutes with the patient his assessment has been noted by me in detail and the management options were: 1. Continued pressure offloading and wound care versus operative procedures including wound excision 2. Soft tissue and bone sampling 3. If the wound gets larger wound closure would be done using a variety of plastic surgical techniques including but not limited to skin substitute, possible skin graft, local versus regional flaps, negative pressure dressing application. 4. He discussed with her details of flap surgery and the risks associated 5. He made a comment that since the patient was operated on by [name] of [facility] plastic surgery unit in [place] the patient may continue to follow-up there for further evaluation for surgical flap closure in the future. 03/19/2016 -- the patient continues to be rather depressed and frustrated with her lack of rapid progress in  healing this wound especially because she thought after hyperbaric oxygen therapy the wound would heal extremely fast. She now understands that was not the implied benefit on wound care which was the recommendation for hyperbaric oxygen therapy. I have had a lengthy discussion with the patient and her husband regarding her  options: 1. Continue with collagen and wound VAC for the primary dressing and offloading and all supportive care. 2. See [name] for possible placement of Acell or Integra in the OR. 3. get a second opinion from a wound care center and surrounding regions/counties 05/07/2016 -- Note from Dr. Celedonio Miyamoto, who noted that the patient has declined flap surgery. She has discussed application of A cell, and try a few applications to see how the wound progresses. She is also recommended that we could apply products here in the wound center, like Oasis. during her preop workup it was found that her hemoglobin A1c was 11% and she has now been diagnosed as having diabetes mellitus and has been put on appropriate treatment by her PCP 05/28/2016 -- tells me her blood sugars have been doing well and she has an appointment to see her PCP in the next couple of weeks to check her hemoglobin A1c. Other than that she continues to do well. 06/25/2016 -- have not seen her back for the last month but she says her health has been about the same and she has an appointment to check the A1c next week 09/10/16 ---- was seen by Dr. Celedonio Miyamoto -- who applied Acell and saw her back in follow-up. She has recommended silver alginate to the wound every other day and cover with foam. If no significant drainage could transition to collagen every other day. She recommended discontinuing wound VAC. There were no plans to repeat application of Acell. The patient expressed that her husband could do the wound care as going to the Wound Ctr., would cost several $100 for each visit. 10/21/2016 -- her insurance company is getting her new mattress and she is pleased about that. Other than that she has been doing dressings with PolyMem Silver and has been doing very well 02/18/2017 -- she has gone through several changes of her mattress and has not been pleased with any of them. The ventricles are still working on trying to  fit her with the appropriate low air-loss mattress. She has a new wound on the gluteal area which is clearly separated from the original wound. 03/25/17-she is here in follow-up evaluation for her left ischialpressure ulcer. She remains unsatisfied with her pressure mattress. She admits to sitting multiple hours a day, in the bed. We have discussed offloading options. The wound does not appear infected. Nutrition does not appear to be a concern. Will follow-up in 4 weeks, if wound continues to be stalled may consider x-ray to evaluate for refractory osteomyelitis. 04/21/17; this is a patient that I don't know all that well. She has a chronic wound which at one point had underlying osteomyelitis in the left ischial tuberosity. This is a stage IV pressure ulcer. Over the last 3 months she has a stage II wound inferiorly to the original wound. The last time she was here her dressing was changed to silver collagen although the patient's husband who changes the dressing said that the collagen stuck to the wound and remove skin from the superficial area therefore he switched back to Gypsum 05/13/17; this is a patient we've been following for a left ischial tuberosity wound which was stage IV  at one point had underlying osteomyelitis. Over the last several months she's had a stage II wound just inferior and medial to the related to the wound. According to her husband he is using Endoform layer with collagen although this is not what I had last time. According to her husband they are using Elgie Congo with collagen although I don't quite know how that started. She was hospitalized from 1/20 through 04/30/16. This was related to a UTI. Her blood cultures were negative, urine culture showed multiple species. She did have a CT scan of the abdomen and pelvis which documented chronic osteomyelitis in the area of the wound inflammatory markers were unremarkable. She has had prior knowledge of osteomyelitis.  It looks as though she received IV antibiotics in 2017 and was treated with a course of hyperbaric oxygen. 05/28/17; the wound over the left ischial tuberosity is deeper today and abuts clearly on bone. Nursing intake reported drainage. I therefore culture of the wound. The more superficial area just below this looks about the same. They once again complained that there are mattress cover is not working although apparently [facility] is been noted to see this many times in the report is that the device is functional 06/18/17; the patient had a probing area on the left ischial tuberosity that was draining purulent fluid last time. This also clearly seemed to have open bone. Culture I did showed pansensitive pseudomonas including third generation cephalosporins. I treated this with cefdinir 300 twice a day for 10 days and things seem to have improved. She has a more superficial wound just underneath this area. Amazingly she has a new air mattress through [facility]. I think they gave this to her as a parking give. In any case this now works according to the patient may have something to do with why the areas are looking better. 07/09/17; the patient has a probing area in the left ischial tuberosity that still has some depth. However this is contracted in terms of the wound orifice although the depth is still roughly the same. There is no undermining. She also has the satellite wound which is more superficial. This appears to have a healthy surface we've been using silver collagen 08/06/17; the patient's wound is over the left ischial tuberosity and a satellite lesion just underneath this. The original wound was actually a deep stage 4 wound. We have made good progress in 2 months and there is no longer exposed bone here. 09/03/17; left ischial tuberosity actually appears to be quite healthy. I think we are making progress. No debridement is required. There is no surrounding  erythema 10/01/17 I follow this patient monthly for her left ischial tuberosity wound. There is 2 areas the original area and a satellite area. The satellite area looks a lot better there is no surrounding erythema. Her husband relates that he is having trouble maintaining the dressing. This has to do with the soft tissue around it. He states he puts the collagen in but he cannot make sure that it stays in even with the ABD pads and tape that he is been using 10/29/17; patient arrives with a better looking noon today. Some of the satellite lesions have closed. using Prisma 11/26/17; the patient has a large cone-shaped area with the tip of the Cone deep within her buttock soft tissue. The walls of the Cone are epithelialized however the base is still open. The area at the base of this looks moist we've been using silver collagen. Will  change to silver alginate 12/31/2017; the wound appears to have come in fairly nicely. Using silver alginate. There is no surrounding maceration or infection 01/28/18; there is still an open area here over the left initial tuberosity. Base of this however looks healthy. There is no surrounding infection 02/25/18; the area of its open is over the left ischial tuberosity. The base of this is where the wound is. This is a large inverted cone-shaped area with the wound at the tip. Dimensions of the wound at the tip are improved. There is a area of denuded skin about halfway towards the tip which her husband thinks may have happened today when he was bathing her. 04/20/17; the area is still open over the left initial tuberosity. 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 This is now completely closed over although it is closed over with a divot and skin over bone at the base of this.  Continued aggressive offloading will be necessary. 12/30/2018 READMISSION This is a 69 year old woman with chronic paraplegia. I picked her up for her care from Dr. Con Memos in this clinic after he departed. She had a stage IV pressure wound over the left ischial tuberosity. She was treated twice for her underlying osteomyelitis and this I believe firstly in 2016 and again in 2017. There were some plans at some point for flap closure of this however she was discovered to have uncontrolled diabetes and I do not think this was ever accomplished. She ultimately healed over in this clinic and was discharged in May. She has a large cone-shaped indentation with the tip of this going towards the left ischial tuberosity. It is not an easy area to examine but at that time I thought all of this was epithelialized. Apparently there was a reopening here shortly after she left the clinic last time. She was admitted to hospital at the end of June for Klebsiella bacteremia felt to be secondary to UTI. A CT scan of the pelvis is listed below and there was initially some concern that she had underlying osteomyelitis although I believe she was seen by infectious disease and that was felt to be not the case: I do not see any new cultures or inflammatory markers IMPRESSION: 1. No CT evidence for acute intra-abdominal or pelvic abnormality. Large volume of stool throughout the colon. 2. Enlarged fatty liver with fat sparing near the gallbladder fossa 3. Cortical scarring right kidney. Bilateral intrarenal stones without hydronephrosis. Thick-walled urinary bladder decompressed by suprapubic catheter 4. Deep left decubitus ulcer with underlying left ischial changes suggesting osteomyelitis. Her husband has been using silver collagen in the wound. She has not been systemically unwell no fever chills eating and drinking well. They rigorously offload this wound only getting up in the wheelchair when she is going  to appointments the rest of the time she is in bed. 10/8; wound measures larger and she now has exposed bone. We have been using silver alginate 11/12 still using silver alginate. The patient saw Dr. Megan Salon of infectious disease. She was started on Augmentin 500 mg twice daily. She is due to follow-up with Dr. Megan Salon I believe next week. Lab work Dr. Megan Salon requested showed a sedimentation rate of 28 and CRP of 20 although her CRP 1 year ago was 18.8. Sedimentation rate 1 year ago  was 11 basic metabolic panel showed a creatinine of 1.12 12/3; the patient followed up with Dr. Megan Salon yesterday. She is still on Augmentin twice daily. This was directed by Dr. Megan Salon. The patient's inflammatory markers have improved which is gratifying. Her C-reactive protein was repeated yesterday and follow-up booked with infectious disease in January. In addition I have been getting secure text messages I think from palliative care through [facility] [employer]. I think they were hoping to provide services to the patient in her home. They could not get a hold of the primary physician and so they reached out to me on 2 separate occasions. 12/17; patient last saw Dr. Megan Salon on 12/2. She is finishing up with Augmentin. Her C-reactive protein was 20 on 10/21, 10.1 on 11/19 and 17 on 12/2. The wound itself still has depth and undermining. We are using Santyl with the backing wet-to-dry 04/27/2019. The wound is gradually clearing up in terms of the surface although it is not filled in that much. Still abuts right against bone 2/4; patient with a deep pressure ulcer over the left ischial tuberosity. I thought she was going to follow-up with infectious disease to follow her inflammatory markers although the patient states that they stated that they did not need to see her unless we felt it was necessary. I will need to check their notes. In any case we ordered moistened silver collagen back  with wet-to-dry to fill in the depth of the wound although apparently [facility] sent silver alginate which they have been using since they were here the last time. Is obviously not what we ordered. Electronic Signature(s) Signed: 05/12/2019 5:50:02 PM By: Linton Ham MD Entered By: Linton Ham on 05/12/2019 11:24:18 -------------------------------------------------------------------------------- Physical Exam Details Patient Name: Date of Service: [name], [name] 05/12/2019 10:00 AM Medical Record [id number] Patient Account Number: [id number] Date of Birth/Sex: Treating RN: 1951/02/28 (69 y.o. [name] Primary Care Provider: Sandi [name] Other Clinician: Referring Provider: Treating Provider/Extender:Crestina Strike, [name], [name] in Treatment: 19 Constitutional Sitting or standing Blood Pressure is within target range for patient.. Pulse regular and within target range for patient.Marland Kitchen Respirations regular, non-labored and within target range.. Temperature is normal and within the target range for the patient.Marland Kitchen Appears in no distress. Gastrointestinal (GI) Abdomen is soft and non-distended without masses or tenderness.. Genitourinary (GU) Foley catheter. Notes Wound exam; left ischial tuberosity; this is still a deep cone-shaped wound. With a small tip of the cone right against bone although I actually do not feel the bone. There is no surrounding soft tissue infection that is obvious. The whole wound area is in the crease of a fold in her left buttock. Electronic Signature(s) Signed: 05/12/2019 5:50:02 PM By: Linton Ham MD Entered By: Linton Ham on 05/12/2019 11:25:20 -------------------------------------------------------------------------------- Physician Orders Details Patient Name: Date of Service: [name], [name] 05/12/2019 10:00 AM Medical Record [id number] Patient Account Number: [id number] Date of Birth/Sex: Treating RN: Nov 14, 1950 (69 y.o. [name], [name] Primary Care Provider: Sandi [name] Other Clinician: Referring Provider: Treating Provider/Extender:Kiva Norland, [name], [name] in Treatment: 19 Verbal / Phone Orders: No Diagnosis Coding ICD-10 Coding Code Description 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, stage 4 G82.20 Paraplegia, unspecified E11.622 Type 2 diabetes mellitus with other skin ulcer M86.68 Other chronic osteomyelitis, other site Follow-up Appointments Return Appointment in 2 weeks. - Thursday Dressing Change Frequency Change dressing every day. Skin Barriers/Peri-Wound Care Skin Prep - to periwound. Barrier cream - as needed for maceration, wetness, or redness to periwound.  Wound Cleansing Wound #12 Left Ischial Tuberosity May shower and wash wound with soap and water. - or wound cleanser Primary Wound Dressing Wound #12 Left Ischial Tuberosity Silver Collagen - apply to wound bed moisten with hydrogel. back with saline or hydrogel moisten gauze. back with dry gauze. Secondary Dressing Wound #12 Left Ischial Tuberosity ABD pad - or bordered foam. Off-Loading Low air-loss mattress (Group 2) - continue to use Gel wheelchair cushion - continue to use specialty wheelchair cushion. Turn and reposition every 2 hours Electronic Signature(s) Signed: 05/12/2019 5:50:02 PM By: Linton Ham MD Signed: 05/12/2019 5:56:18 PM By: Deon Pilling Entered By: Deon Pilling on 05/12/2019 11:01:02 -------------------------------------------------------------------------------- Problem List Details Patient Name: Date of Service: [name], [name] 05/12/2019 10:00 AM Medical Record [id number] Patient Account Number: [id number] Date of Birth/Sex: Treating RN: Aug 11, 1950 (69 y.o. [name], [name] Primary Care Provider: Sandi [name] Other Clinician: Referring Provider: Treating Provider/Extender:Nalah Macioce, [name], [name] in Treatment: 19 Active Problems ICD-10 Evaluated Encounter Code Description Active  Date Today Diagnosis 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, stage 4 12/30/2018 No Yes G82.20 Paraplegia, unspecified 12/30/2018 No Yes E11.622 Type 2 diabetes mellitus with other skin ulcer 12/30/2018 No Yes M86.68 Other chronic osteomyelitis, other site 02/17/2019 No Yes Inactive Problems Resolved Problems Electronic Signature(s) Signed: 05/12/2019 5:50:02 PM By: Linton Ham MD Entered By: Linton Ham on 05/12/2019 11:19:05 -------------------------------------------------------------------------------- Progress Note Details Patient Name: Date of Service: [name] 05/12/2019 10:00 AM Medical Record [id number] Patient Account Number: [id number] Date of Birth/Sex: Treating RN: Oct 20, 1950 (69 y.o. [name], [name] Primary Care Provider: Sandi [name] Other Clinician: Referring Provider: Treating Provider/Extender:Kendrik Mcshan, [name], [name] in Treatment: 19 Subjective History of Present Illness (HPI) The following HPI elements were documented for the patient's wound: Location: open ulceration of the left gluteal area, left heel and right ankle for about 5 months. Quality: Patient reports No Pain. Severity: Patient states wound(s) are getting worse. Duration: Patient has had the wound for > 5 months prior to seeking treatment at the wound center Context: The wound occurred when the patient has been paraplegic for about 3 years. Modifying Factors: Wound improving due to current treatment. Associated Signs and Symptoms: Patient reports having foul odor. this 69 year old patient who is known to have hypertension, hypothyroidism, breast cancer, chronic pain syndrome, paraplegia was noted to have a left gluteal decubitus ulcer and was brought into the hospital. During the course of her hospitalization she was debrided in the operating room by [name] and had all the wounds sharply debrided. The debridement was done for the left ischial wound, the left heel wound and the  right ankle wound. Bone cultures were taken at that time but were negative but clinically she was treated for osteomyelitis because of the probing down to bone and open exposed bone. Home health has been giving her antibioticss which include vancomycin and Zosyn. The patient was a smoker until about 3 weeks ago and used to smoke about 10 cigarettes a day for a long while. 12/13/2014 - details of her operative note from 11/03/2014 were reviewed -- PROCEDURE: 1. Excisional debridement skin, subcutaneous, muscle left ischium 35 cm2 2. Excisional debridement skin, subcutaneous tissue left heel 27 cm2 3. Excisional debridement right ankle skin, subcutaneous, bone 30 cm2 01/24/2015 -- she has some issues with her wheelchair cushion but other than that is doing very well and has received Podus boots for her feet. 02/14/2015 -- she was using her old offloading boots and this seemed to have caused her a new  pressure ulcer on the left posterior heel near the superior part just below the Achilles tendon. 03/07/2015 -- she has a new ulceration just to the left of the midline on her sacral region more on the left buttock and this has been there for about a week. 08/22/2015 -- was recently admitted to hospital between May 5 and 08/13/2015, with sepsis and leukocytosis due to a UTI. she was treated for a sepsis complicating Escherichia coli UTI and kidney stones. She also had metabolic and careful up at the secondary to pyelonephritis. He received broad-spectrum antibiotics initially and then received Macrobid as per urology. She was sent home on nitrofurantoin. during her admission she had a CT scan which showed exposed left ischial tuberosity without evidence of osteolysis. 09/12/2015-- the patient is having some issues with her air mattress and would like to get a opinion from medical modalities. 10/10/2015 -- the issue with her air mattress has not yet been sorted out and the new problem seems to be a lot  of odor from the wound VAC. 11/27/2015 -- the patient was admitted to the hospital between July 23 and 10/31/2015. Her problems were sepsis, osteomyelitis of the pelvic bone and acute pyelonephritis. CT of the abdomen and pelvis was consistent with a left- sided pyelonephritis with hydronephrosis and also just showed new sclerosis of the posterior portion of the left anterior pubic ramus suggestive of periosteal reaction consistent with osteomyelitis. She was treated for the osteomyelitis with infectious disease consult recommending 6 weeks of IV antibiotics including vancomycin and Rocephin and the antibiotics were to go on until 12/10/2015. He was seen by [name] plastic surgery and [name] of infectious disease. She had a suprapubic catheter placed during the admission. CT scan done on 10/28/2015 showed specifically -- New sclerosis of the posterior portion of the left inferior pubic ramus with aggressive periosteal reaction, consistent with osteomyelitis, with adjacent soft tissue gas compatible with previously described decubitus ulcer. 12/12/2015 -- she was recently seen by [name], who noted good improvement and CRP and ESR compared to before and he has stopped her antibiotic as per plans to finish on September 4. The patient was encouraged to continue with wound care and consider hyperbaric oxygen therapy. Today she tells me that she has consented to undergo hyperbaric oxygen therapy and we can start the paperwork. 01/02/2016 -- her PCP had gained about 71 years but she still persists in having problems during hyperbaric oxygen therapy with some discomfort in the ears. 01/09/16; pressure area with underlying osteomyelitis in the left buttock. Wound bed itself has some slight amount of grayish surface slough however I do not think any debridement was necessary. There is no exposed bone soft tissue appears stable. She is using a wound VAC 01/16/16; back for weekly wound review in  conjunction with HBO. She has a deep wound over the left initial tuberosity previously treated with 6 weeks of IV antibiotics for osteomyelitis. Wound bed looks reasonably healthy although the base of this is still precariously close to bone. She has been using a wound VAC. 01/23/2016 -- she has completed her course of antibiotics and this week the only new thing is her right great toe nail was avulsed and she has got an open wound over the nailbed. 01/31/16 she has completed her course of antibiotics. Her right great toenail avulsed last week and she's been using silver alginate for this as well. Still using a wound VAC to the substantial stage IV wound over the left ischial  tuberosity 03/05/2016 -- the patient has had a opinion from the plastic surgery group at [facility] and details of this are not available yet but the patient's verbal report has been heard by me. Did not sound like there was any optimistic discussion regarding reconstruction and the net result would be to continue with the wound VAC application. I will await the official reports. Addendum: -- she was seen at [facility] surgery service by Dr. Tressa Busman. After a thorough review and from what I understand spending 45 minutes with the patient his assessment has been noted by me in detail and the management options were: 1. Continued pressure offloading and wound care versus operative procedures including wound excision 2. Soft tissue and bone sampling 3. If the wound gets larger wound closure would be done using a variety of plastic surgical techniques including but not limited to skin substitute, possible skin graft, local versus regional flaps, negative pressure dressing application. 4. He discussed with her details of flap surgery and the risks associated 5. He made a comment that since the patient was operated on by [name] of [facility] plastic surgery unit in [place] the  patient may continue to follow-up there for further evaluation for surgical flap closure in the future. 03/19/2016 -- the patient continues to be rather depressed and frustrated with her lack of rapid progress in healing this wound especially because she thought after hyperbaric oxygen therapy the wound would heal extremely fast. She now understands that was not the implied benefit on wound care which was the recommendation for hyperbaric oxygen therapy. I have had a lengthy discussion with the patient and her husband regarding her options: 1. Continue with collagen and wound VAC for the primary dressing and offloading and all supportive care. 2. See [name] for possible placement of Acell or Integra in the OR. 3. get a second opinion from a wound care center and surrounding regions/counties 05/07/2016 -- Note from Dr. Celedonio Miyamoto, who noted that the patient has declined flap surgery. She has discussed application of A cell, and try a few applications to see how the wound progresses. She is also recommended that we could apply products here in the wound center, like Oasis. during her preop workup it was found that her hemoglobin A1c was 11% and she has now been diagnosed as having diabetes mellitus and has been put on appropriate treatment by her PCP 05/28/2016 -- tells me her blood sugars have been doing well and she has an appointment to see her PCP in the next couple of weeks to check her hemoglobin A1c. Other than that she continues to do well. 06/25/2016 -- have not seen her back for the last month but she says her health has been about the same and she has an appointment to check the A1c next week 09/10/16 ---- was seen by Dr. Celedonio Miyamoto -- who applied Acell and saw her back in follow-up. She has recommended silver alginate to the wound every other day and cover with foam. If no significant drainage could transition to collagen every other day. She recommended discontinuing  wound VAC. There were no plans to repeat application of Acell. The patient expressed that her husband could do the wound care as going to the Wound Ctr., would cost several $100 for each visit. 10/21/2016 -- her insurance company is getting her new mattress and she is pleased about that. Other than that she has been doing dressings with PolyMem Silver and  has been doing very well 02/18/2017 -- she has gone through several changes of her mattress and has not been pleased with any of them. The ventricles are still working on trying to fit her with the appropriate low air-loss mattress. She has a new wound on the gluteal area which is clearly separated from the original wound. 03/25/17-she is here in follow-up evaluation for her left ischialpressure ulcer. She remains unsatisfied with her pressure mattress. She admits to sitting multiple hours a day, in the bed. We have discussed offloading options. The wound does not appear infected. Nutrition does not appear to be a concern. Will follow-up in 4 weeks, if wound continues to be stalled may consider x-ray to evaluate for refractory osteomyelitis. 04/21/17; this is a patient that I don't know all that well. She has a chronic wound which at one point had underlying osteomyelitis in the left ischial tuberosity. This is a stage IV pressure ulcer. Over the last 3 months she has a stage II wound inferiorly to the original wound. The last time she was here her dressing was changed to silver collagen although the patient's husband who changes the dressing said that the collagen stuck to the wound and remove skin from the superficial area therefore he switched back to Garceno 05/13/17; this is a patient we've been following for a left ischial tuberosity wound which was stage IV at one point had underlying osteomyelitis. Over the last several months she's had a stage II wound just inferior and medial to the related to the wound. According to her husband he is  using Endoform layer with collagen although this is not what I had last time. According to her husband they are using Elgie Congo with collagen although I don't quite know how that started. She was hospitalized from 1/20 through 04/30/16. This was related to a UTI. Her blood cultures were negative, urine culture showed multiple species. She did have a CT scan of the abdomen and pelvis which documented chronic osteomyelitis in the area of the wound inflammatory markers were unremarkable. She has had prior knowledge of osteomyelitis. It looks as though she received IV antibiotics in 2017 and was treated with a course of hyperbaric oxygen. 05/28/17; the wound over the left ischial tuberosity is deeper today and abuts clearly on bone. Nursing intake reported drainage. I therefore culture of the wound. The more superficial area just below this looks about the same. They once again complained that there are mattress cover is not working although apparently [facility] is been noted to see this many times in the report is that the device is functional 06/18/17; the patient had a probing area on the left ischial tuberosity that was draining purulent fluid last time. This also clearly seemed to have open bone. Culture I did showed pansensitive pseudomonas including third generation cephalosporins. I treated this with cefdinir 300 twice a day for 10 days and things seem to have improved. She has a more superficial wound just underneath this area. Amazingly she has a new air mattress through [facility]. I think they gave this to her as a parking give. In any case this now works according to the patient may have something to do with why the areas are looking better. 07/09/17; the patient has a probing area in the left ischial tuberosity that still has some depth. However this is contracted in terms of the wound orifice although the depth is still roughly the same. There is no undermining. ooShe  also  has the satellite wound which is more superficial. This appears to have a healthy surface we've been using silver collagen 08/06/17; the patient's wound is over the left ischial tuberosity and a satellite lesion just underneath this. The original wound was actually a deep stage 4 wound. We have made good progress in 2 months and there is no longer exposed bone here. 09/03/17; left ischial tuberosity actually appears to be quite healthy. I think we are making progress. No debridement is required. There is no surrounding erythema 10/01/17 I follow this patient monthly for her left ischial tuberosity wound. There is 2 areas the original area and a satellite area. The satellite area looks a lot better there is no surrounding erythema. Her husband relates that he is having trouble maintaining the dressing. This has to do with the soft tissue around it. He states he puts the collagen in but he cannot make sure that it stays in even with the ABD pads and tape that he is been using 10/29/17; patient arrives with a better looking noon today. Some of the satellite lesions have closed. using Prisma 11/26/17; the patient has a large cone-shaped area with the tip of the Cone deep within her buttock soft tissue. The walls of the Cone are epithelialized however the base is still open. The area at the base of this looks moist we've been using silver collagen. Will change to silver alginate 12/31/2017; the wound appears to have come in fairly nicely. Using silver alginate. There is no surrounding maceration or infection 01/28/18; there is still an open area here over the left initial tuberosity. Base of this however looks healthy. There is no surrounding infection 02/25/18; the area of its open is over the left ischial tuberosity. The base of this is where the wound is. This is a large inverted cone-shaped area with the wound at the tip. Dimensions of the wound at the tip are improved. There is a area of denuded  skin about halfway towards the tip which her husband thinks may have happened today when he was bathing her. 04/20/17; the area is still open over the left initial tuberosity. 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 This is now completely closed over although it is closed over with a divot and skin over bone at the base of this. Continued aggressive offloading will be necessary. 12/30/2018 READMISSION This is a 69 year old woman with chronic paraplegia. I picked her up for her care from Dr. Con Memos in this clinic after he departed. She had a stage IV pressure wound over the left ischial tuberosity. She was treated twice for her underlying osteomyelitis and this I believe firstly in 2016 and again in 2017. There were some plans at some point for flap closure of this however she was discovered to have uncontrolled diabetes and I do not think this was ever accomplished. She ultimately healed over in this clinic and was discharged in May. She has a large cone-shaped indentation with the tip of this going towards the left ischial tuberosity. It is not an easy area to examine but at that time I thought all of this was epithelialized. Apparently there was a reopening here shortly after she left the clinic last time. She was admitted to hospital at the end of June for Klebsiella bacteremia felt to  be secondary to UTI. A CT scan of the pelvis is listed below and there was initially some concern that she had underlying osteomyelitis although I believe she was seen by infectious disease and that was felt to be not the case: I do not see any new cultures or inflammatory markers IMPRESSION: 1. No CT evidence for acute intra-abdominal or pelvic abnormality. Large volume of stool throughout the colon. 2. Enlarged  fatty liver with fat sparing near the gallbladder fossa 3. Cortical scarring right kidney. Bilateral intrarenal stones without hydronephrosis. Thick-walled urinary bladder decompressed by suprapubic catheter 4. Deep left decubitus ulcer with underlying left ischial changes suggesting osteomyelitis. Her husband has been using silver collagen in the wound. She has not been systemically unwell no fever chills eating and drinking well. They rigorously offload this wound only getting up in the wheelchair when she is going to appointments the rest of the time she is in bed. 10/8; wound measures larger and she now has exposed bone. We have been using silver alginate 11/12 still using silver alginate. The patient saw Dr. Megan Salon of infectious disease. She was started on Augmentin 500 mg twice daily. She is due to follow-up with Dr. Megan Salon I believe next week. Lab work Dr. Megan Salon requested showed a sedimentation rate of 28 and CRP of 20 although her CRP 1 year ago was 18.8. Sedimentation rate 1 year ago was 11 basic metabolic panel showed a creatinine of 1.12 12/3; the patient followed up with Dr. Megan Salon yesterday. She is still on Augmentin twice daily. This was directed by Dr. Megan Salon. The patient's inflammatory markers have improved which is gratifying. Her C-reactive protein was repeated yesterday and follow-up booked with infectious disease in January. In addition I have been getting secure text messages I think from palliative care through [facility] [employer]. I think they were hoping to provide services to the patient in her home. They could not get a hold of the primary physician and so they reached out to me on 2 separate occasions. 12/17; patient last saw Dr. Megan Salon on 12/2. She is finishing up with Augmentin. Her C-reactive protein was 20 on 10/21, 10.1 on 11/19 and 17 on 12/2. The wound itself still has depth and undermining. We are using Santyl with the backing  wet-to-dry 04/27/2019. The wound is gradually clearing up in terms of the surface although it is not filled in that much. Still abuts right against bone 2/4; patient with a deep pressure ulcer over the left ischial tuberosity. I thought she was going to follow-up with infectious disease to follow her inflammatory markers although the patient states that they stated that they did not need to see her unless we felt it was necessary. I will need to check their notes. In any case we ordered moistened silver collagen back with wet-to-dry to fill in the depth of the wound although apparently prism sent silver alginate which they have been using since they were here the last time. Is obviously not what we ordered. Objective Constitutional Sitting or standing Blood Pressure is within target range for patient.. Pulse regular and within target range for patient.Marland Kitchen Respirations regular, non-labored and within target range.. Temperature is normal and within the target range for the patient.Marland Kitchen Appears in no distress. Vitals Time Taken: 10:30 AM, Height: 63 in, Weight: 185 lbs, BMI: 32.8, Temperature: 97.8 F, Pulse: 79 bpm, Respiratory Rate: 18 breaths/min, Blood Pressure: 130/56 mmHg. Gastrointestinal (GI) Abdomen is soft and non-distended without masses or tenderness.. Genitourinary (GU)  Foley catheter. General Notes: Wound exam; left ischial tuberosity; this is still a deep cone-shaped wound. With a small tip of the cone right against bone although I actually do not feel the bone. There is no surrounding soft tissue infection that is obvious. The whole wound area is in the crease of a fold in her left buttock. Integumentary (Hair, Skin) Wound #12 status is Open. Original cause of wound was Pressure Injury. The wound is located on the Left Ischial Tuberosity. The wound measures 2.5cm length x 2.3cm width x 2.6cm depth; 4.516cm^2 area and 11.742cm^3 volume. There is Fat Layer (Subcutaneous Tissue) Exposed  exposed. There is no tunneling or undermining noted. There is a medium amount of serosanguineous drainage noted. The wound margin is well defined and not attached to the wound base. There is medium (34-66%) pink granulation within the wound bed. There is a medium (34-66%) amount of necrotic tissue within the wound bed including Adherent Slough. Assessment Active Problems ICD-10 Pressure ulcer of left buttock, stage 4 Paraplegia, unspecified Type 2 diabetes mellitus with other skin ulcer Other chronic osteomyelitis, other site Plan Follow-up Appointments: Return Appointment in 2 weeks. - Thursday Dressing Change Frequency: Change dressing every day. Skin Barriers/Peri-Wound Care: Skin Prep - to periwound. Barrier cream - as needed for maceration, wetness, or redness to periwound. Wound Cleansing: Wound #12 Left Ischial Tuberosity: May shower and wash wound with soap and water. - or wound cleanser Primary Wound Dressing: Wound #12 Left Ischial Tuberosity: Silver Collagen - apply to wound bed moisten with hydrogel. back with saline or hydrogel moisten gauze. back with dry gauze. Secondary Dressing: Wound #12 Left Ischial Tuberosity: ABD pad - or bordered foam. Off-Loading: Low air-loss mattress (Group 2) - continue to use Gel wheelchair cushion - continue to use specialty wheelchair cushion. Turn and reposition every 2 hours 1. I have gone back to this moistened silver collagen on the base of the wound with a wet-to-dry covering 2. I plan to try this for a month to see if we can get any additional granulation 3. I know her husband is trying to offload this is much as he can 4. Wound VAC attempt at 1 month provided we can get insurance coverage for home health 5. I do not know about considering plastic surgery for a myocutaneous flap for this patient this would be better the last option I could consider. 6. All reviewed the infectious disease notes. Discharging her was not what I  thought the last plan was here. Electronic Signature(s) Signed: 05/12/2019 5:50:02 PM By: Linton Ham MD Entered By: Linton Ham on 05/12/2019 11:27:10 -------------------------------------------------------------------------------- SuperBill Details Patient Name: Date of Service: [name], [name] 05/12/2019 Medical Record Number:[id number] Patient Account Number: [id number] Date of Birth/Sex: Treating RN: 1951-03-26 (69 y.o. [name], [name] Primary Care Provider: Sandi [name] Other Clinician: Referring Provider: Treating Provider/Extender:Taviana Westergren, [name], [name] in Treatment: 19 Diagnosis Coding ICD-10 Codes Code Description 5021608999 Pressure ulcer of left buttock, stage 4 G82.20 Paraplegia, unspecified E11.622 Type 2 diabetes mellitus with other skin ulcer M86.68 Other chronic osteomyelitis, other site Facility Procedures The patient participates with Medicare or their insurance follows the Medicare Facility Guidelines: CPT4 Code Description Modifier Quantity [id number] Crum VISIT-LEV 3 EST PT 1 Physician Procedures CPT4 Code: 6010932 Description: 35573 - WC PHYS LEVEL 4 - EST PT ICD-10 Diagnosis Description 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, stage 4 M86.68 Other chronic osteomyelitis, other site Modifier: Quantity: 1 Electronic Signature(s) Signed: 05/12/2019 5:50:02 PM By: Linton Ham MD  Entered By: Linton Ham on 05/12/2019 11:27:21

## 2019-05-12 NOTE — Progress Notes (Addendum)
INANNA, TELFORD ([id number]) Visit Report for 05/12/2019 Arrival Information Details Patient Name: Date of Service: [name], [name] 05/12/2019 10:00 AM Medical Record Number:[id number] Patient Account Number: [id number] Date of Birth/Sex: Treating RN: 1951-01-02 (69 y.o. [name] Primary Care [name]: [name] Other Clinician: Referring [name]: Treating [name]/Extender:[name], [name], [name] in Treatment: 19 Visit Information History Since Last Visit Added or deleted any medications: No Patient Arrived: Wheel Chair Any new allergies or adverse reactions: No Arrival Time: 10:34 Had a fall or experienced change in No activities of daily living that may affect Accompanied By: husband risk of falls: Transfer Assistance: Manual Signs or symptoms of abuse/neglect since last No Patient Identification Verified: Yes visito Secondary Verification Process Completed: Yes Hospitalized since last visit: No Patient Requires Transmission-Based No Implantable device outside of the clinic excluding No Precautions: cellular tissue based products placed in the center Patient Has Alerts: No since last visit: Has Dressing in Place as Prescribed: Yes Pain Present Now: No Electronic Signature(s) Signed: 05/12/2019 5:44:01 PM By: Kela Millin Entered By: Kela Millin on 05/12/2019 10:34:33 -------------------------------------------------------------------------------- Clinic Level of Care Assessment Details Patient Name: Date of Service: [name], [name] 05/12/2019 10:00 AM Medical Record Number:[id number] Patient Account Number: [id number] Date of Birth/Sex: Treating RN: 25-Feb-1951 (69 y.o. [name], [name] Primary Care [name]: [name] Other Clinician: Referring [name]: Treating [name]/Extender:[name], [name], [name] in Treatment: 19 Clinic Level of Care Assessment Items TOOL 4 Quantity Score X - Use when only an EandM is performed on FOLLOW-UP  visit 1 0 ASSESSMENTS - Nursing Assessment / Reassessment X - Reassessment of Co-morbidities (includes updates in patient status) 1 10 X - Reassessment of Adherence to Treatment Plan 1 5 ASSESSMENTS - Wound and Skin Assessment / Reassessment '[]'$  - Simple Wound Assessment / Reassessment - one wound 0 X - Complex Wound Assessment / Reassessment - multiple wounds 1 5 X - Dermatologic / Skin Assessment (not related to wound area) 1 10 ASSESSMENTS - Focused Assessment '[]'$  - Circumferential Edema Measurements - multi extremities 0 X - Nutritional Assessment / Counseling / Intervention 1 10 '[]'$  - Lower Extremity Assessment (monofilament, tuning fork, pulses) 0 '[]'$  - Peripheral Arterial Disease Assessment (using hand held doppler) 0 ASSESSMENTS - Ostomy and/or Continence Assessment and Care '[]'$  - Incontinence Assessment and Management 0 '[]'$  - Ostomy Care Assessment and Management (repouching, etc.) 0 PROCESS - Coordination of Care '[]'$  - Simple Patient / Family Education for ongoing care 0 X - Complex (extensive) Patient / Family Education for ongoing care 1 20 X - Staff obtains Programmer, systems, Records, Test Results / Process Orders 1 10 '[]'$  - Staff telephones HHA, Nursing Homes / Clarify orders / etc 0 '[]'$  - Routine Transfer to another Facility (non-emergent condition) 0 '[]'$  - Routine Hospital Admission (non-emergent condition) 0 '[]'$  - New Admissions / Biomedical engineer / Ordering NPWT, Apligraf, etc. 0 '[]'$  - Emergency Hospital Admission (emergent condition) 0 '[]'$  - Simple Discharge Coordination 0 X - Complex (extensive) Discharge Coordination 1 15 PROCESS - Special Needs '[]'$  - Pediatric / Minor Patient Management 0 '[]'$  - Isolation Patient Management 0 '[]'$  - Hearing / Language / Visual special needs 0 '[]'$  - Assessment of Community assistance (transportation, D/C planning, etc.) 0 '[]'$  - Additional assistance / Altered mentation 0 '[]'$  - Support Surface(s) Assessment (bed, cushion, seat, etc.) 0 INTERVENTIONS -  Wound Cleansing / Measurement '[]'$  - Simple Wound Cleansing - one wound 0 X - Complex Wound Cleansing - multiple wounds 1 5 '[]'$  - Wound Imaging (photographs -  any number of wounds) 0 '[]'$  - Wound Tracing (instead of photographs) 0 '[]'$  - Simple Wound Measurement - one wound 0 X - Complex Wound Measurement - multiple wounds 1 5 INTERVENTIONS - Wound Dressings '[]'$  - Small Wound Dressing one or multiple wounds 0 X - Medium Wound Dressing one or multiple wounds 1 15 '[]'$  - Large Wound Dressing one or multiple wounds 0 '[]'$  - Application of Medications - topical 0 '[]'$  - Application of Medications - injection 0 INTERVENTIONS - Miscellaneous '[]'$  - External ear exam 0 '[]'$  - Specimen Collection (cultures, biopsies, blood, body fluids, etc.) 0 '[]'$  - Specimen(s) / Culture(s) sent or taken to Lab for analysis 0 '[]'$  - Patient Transfer (multiple staff / Civil Service fast streamer / Similar devices) 0 '[]'$  - Simple Staple / Suture removal (25 or less) 0 '[]'$  - Complex Staple / Suture removal (26 or more) 0 '[]'$  - Hypo / Hyperglycemic Management (close monitor of Blood Glucose) 0 '[]'$  - Ankle / Brachial Index (ABI) - do not check if billed separately 0 X - Vital Signs 1 5 Has the patient been seen at the hospital within the last three years: Yes Total Score: 115 Level Of Care: New/Established - Level 3 Electronic Signature(s) Signed: 05/12/2019 5:56:18 PM By: [name] Entered By: [name] on 05/12/2019 10:59:12 -------------------------------------------------------------------------------- Encounter Discharge Information Details Patient Name: Date of Service: [name] 05/12/2019 10:00 AM Medical Record [id number] Patient Account Number: [id number] Date of Birth/Sex: Treating RN: 1950/10/26 (70 y.o. [name] Primary Care [name]: [name] Other Clinician: Referring [name]: Treating [name]/Extender:[name], [name], [name] in Treatment: 19 Encounter Discharge Information Items Discharge  Condition: Stable Ambulatory Status: Wheelchair Discharge Destination: Home Transportation: Private Auto Accompanied By: spouse Schedule Follow-up Appointment: Yes Clinical Summary of Care: Patient Declined Electronic Signature(s) Signed: 05/12/2019 5:40:52 PM By: Baruch Gouty RN, BSN Entered By: Baruch Gouty on 05/12/2019 11:28:09 -------------------------------------------------------------------------------- Lower Extremity Assessment Details Patient Name: Date of Service: [name], [name] 05/12/2019 10:00 AM Medical Record [id number] Patient Account Number: [id number] Date of Birth/Sex: Treating RN: 09-07-1950 (69 y.o. [name] Primary Care Jacory Kamel: [name] Other Clinician: Referring Teegan Brandis: Treating Deidre Carino/Extender:[name], [name], [name] in Treatment: 19 Electronic Signature(s) Signed: 05/12/2019 5:44:01 PM By: Kela Millin Entered By: Kela Millin on 05/12/2019 10:35:16 -------------------------------------------------------------------------------- Multi Wound Chart Details Patient Name: Date of Service: [name], [name] 05/12/2019 10:00 AM Medical Record [id number] Patient Account Number: [id number] Date of Birth/Sex: Treating RN: [birth year] (69 y.o. [name], [name] Primary Care Keithan Dileonardo: [name] Other Clinician: Referring Shalona Harbour: Treating Prashant Glosser/Extender:[name], [name], [name] in Treatment: 19 Vital Signs Height(in): 23 Pulse(bpm): 64 Weight(lbs): 185 Blood Pressure(mmHg): 130/56 Body Mass Index(BMI): 33 Temperature(F): 97.8 Respiratory 18 Rate(breaths/min): Photos: [12:No Photos] [N/A:N/A] Wound Location: [12:Left Ischial Tuberosity] [N/A:N/A] Wounding Event: [12:Pressure Injury] [N/A:N/A] Primary Etiology: [12:Pressure Ulcer] [N/A:N/A] Comorbid History: [12:Anemia, Hypertension, TypeN/A II Diabetes, Osteoarthritis, Dementia, Paraplegia, Received Radiation] Date Acquired: [12:09/06/2018]  [N/A:N/A] Weeks of Treatment: [12:19] [N/A:N/A] Wound Status: [12:Open] [N/A:N/A] Measurements L x W x D 2.5x2.3x2.6 [N/A:N/A] (cm) Area (cm) : [12:4.516] [N/A:N/A] Volume (cm) : [12:11.742] [N/A:N/A] % Reduction in Area: [12:-1813.60%] [N/A:N/A] % Reduction in Volume: -5438.70% [N/A:N/A] Classification: [12:Category/Stage IV] [N/A:N/A] Exudate Amount: [12:Medium] [N/A:N/A] Exudate Type: [12:Serosanguineous] [N/A:N/A] Exudate Color: [12:red, brown] [N/A:N/A] Wound Margin: [12:Well defined, not attached N/A] Granulation Amount: [12:Medium (34-66%)] [N/A:N/A] Granulation Quality: [12:Pink] [N/A:N/A] Necrotic Amount: [12:Medium (34-66%)] [N/A:N/A] Exposed Structures: [12:Fat Layer (Subcutaneous N/A Tissue) Exposed: Yes Fascia: No Tendon: No Muscle: No Joint: No Bone: No None] [N/A:N/A] Treatment Notes Electronic  Signature(s) Signed: 05/12/2019 5:50:02 PM By: Linton Ham MD Signed: 05/12/2019 5:56:18 PM By: [name] Entered By: Linton Ham on 05/12/2019 11:20:04 -------------------------------------------------------------------------------- Multi-Disciplinary Care Plan Details Patient Name: Date of Service: [name], [name] 05/12/2019 10:00 AM Medical Record [id number] Patient Account Number: [id number] Date of Birth/Sex: Treating RN: 03-May-1950 (69 y.o. [name], [name] Primary Care Jakayden Cancio: [name] Other Clinician: Referring Margaretmary Prisk: Treating Janace Decker/Extender:[name], [name], [name] in Treatment: 19 Active Inactive Abuse / Safety / Falls / Self Care Management Nursing Diagnoses: Impaired physical mobility Potential for falls Goals: Patient will remain injury free related to falls Date Initiated: 12/30/2018 Target Resolution Date: 06/03/2019 Goal Status: Active Patient/caregiver will verbalize/demonstrate measure taken to improve self care Target Resolution Date Initiated: 12/30/2018 Date Inactivated: 01/13/2019 Date: 01/27/2019 Goal Status:  Met Interventions: Assess fall risk on admission and as needed Provide education on fall prevention Notes: Electronic Signature(s) Signed: 05/12/2019 5:56:18 PM By: [name] Entered By: [name] on 05/12/2019 10:17:58 -------------------------------------------------------------------------------- Pain Assessment Details Patient Name: Date of Service: [name], [name] 05/12/2019 10:00 AM Medical Record [id number] Patient Account Number: [id number] Date of Birth/Sex: Treating RN: 1950/06/05 (69 y.o. [name] Primary Care Denzil Mceachron: [name] Other Clinician: Referring Toshie Demelo: Treating Farrell Pantaleo/Extender:[name], [name], [name] in Treatment: 19 Active Problems Location of Pain Severity and Description of Pain Patient Has Paino No Site Locations Pain Management and Medication Current Pain Management: Electronic Signature(s) Signed: 05/12/2019 5:44:01 PM By: Kela Millin Entered By: Kela Millin on 05/12/2019 10:35:09 -------------------------------------------------------------------------------- Patient/Caregiver Education Details Patient Name: Date of Service: [name], [name] 2/4/2021andnbsp10:00 AM Medical Record [id number] Patient Account Number: [id number] Date of Birth/Gender: Treating RN: 02/20/1951 (69 y.o. [name], [name] Primary Care Physician: [name] Other Clinician: Referring Physician: Treating Physician/Extender:[name], [name], [name] in Treatment: 19 Education Assessment Education Provided To: Patient and Caregiver Education Topics Provided Safety: Handouts: Personal Safety Methods: Explain/Verbal Responses: Reinforcements needed Electronic Signature(s) Signed: 05/12/2019 5:56:18 PM By: [name] Entered By: [name] on 05/12/2019 10:18:10 -------------------------------------------------------------------------------- Wound Assessment Details Patient Name: Date of Service: [name], [name] 05/12/2019 10:00 AM Medical Record [id number] Patient Account Number: [id number] Date of Birth/Sex: Treating RN: 09-Aug-1950 (69 y.o. [name], [name] Primary Care Gianny Sabino: [name] Other Clinician: Referring Alizaya Oshea: Treating Brennan Karam/Extender:[name], [name], [name] in Treatment: 19 Wound Status Wound Number: 12 Primary Pressure Ulcer Etiology: Wound Location: Left Ischial Tuberosity Wound Open Wounding Event: Pressure Injury Status: Date Acquired: 09/06/2018 Comorbid Anemia, Hypertension, Type II Diabetes, Weeks Of Treatment: 19 History: Osteoarthritis, Dementia, Paraplegia, Received Clustered Wound: No Radiation Photos Wound Measurements Length: (cm) 2.5 % Reduction in Ar Width: (cm) 2.3 % Reduction in Vo Depth: (cm) 2.6 Epithelialization Area: (cm) 4.516 Tunneling: Volume: (cm) 11.742 Undermining: Wound Description Classification: Category/Stage IV Foul Odor After Wound Margin: Well defined, not attached Slough/Fibrino Exudate Amount: Medium Exudate Type: Serosanguineous Exudate Color: red, brown Wound Bed Granulation Amount: Medium (34-66%) Granulation Quality: Pink Fascia Exposed: Necrotic Amount: Medium (34-66%) Fat Layer (Subcut Necrotic Quality: Adherent Slough Tendon Exposed: Muscle Exposed: Joint Exposed: Bone Exposed: Cleansing: No Yes Exposed Structure No aneous Tissue) Exposed: Yes No No No No ea: -1813.6% lume: -5438.7% : None No No Treatment Notes Wound #12 (Left Ischial Tuberosity) 3. Primary Dressing Applied [employer] 4. Secondary Dressing Other secondary dressing (specify in notes) Foam Border Dressing Notes hydrogel moist gauze Electronic Signature(s) Signed: 05/13/2019 4:37:46 PM By: Mikeal Hawthorne EMT/HBOT Signed: 05/13/2019 5:54:34 PM By: [name] Previous Signature: 05/12/2019 5:44:01 PM Version By: Kela Millin Entered By: Mikeal Hawthorne on  05/13/2019  15:47:07 -------------------------------------------------------------------------------- Vitals Details Patient Name: Date of Service: [name], [name] 05/12/2019 10:00 AM Medical Record Number:[id number] Patient Account Number: [id number] Date of Birth/Sex: Treating RN: 12-18-1950 (69 y.o. [name] Primary Care Luci Bellucci: [name] Other Clinician: Referring Machael Raine: Treating Shaylinn Hladik/Extender:[name], [name], [name] in Treatment: 19 Vital Signs Time Taken: 10:30 Temperature (F): 97.8 Height (in): 63 Pulse (bpm): 79 Weight (lbs): 185 Respiratory Rate (breaths/min): 18 Body Mass Index (BMI): 32.8 Blood Pressure (mmHg): 130/56 Reference Range: 80 - 120 mg / dl Electronic Signature(s) Signed: 05/12/2019 5:44:01 PM By: Kela Millin Entered By: Kela Millin on 05/12/2019 10:35:01

## 2019-05-26 ENCOUNTER — Encounter (HOSPITAL_BASED_OUTPATIENT_CLINIC_OR_DEPARTMENT_OTHER): Payer: Medicare Other | Admitting: Internal Medicine

## 2019-06-02 ENCOUNTER — Encounter (HOSPITAL_BASED_OUTPATIENT_CLINIC_OR_DEPARTMENT_OTHER): Payer: Medicare Other | Admitting: Internal Medicine

## 2019-06-02 ENCOUNTER — Other Ambulatory Visit: Payer: Self-pay

## 2019-06-02 DIAGNOSIS — E11622 Type 2 diabetes mellitus with other skin ulcer: Secondary | ICD-10-CM | POA: Diagnosis not present

## 2019-06-02 NOTE — Progress Notes (Signed)
[name], [name] ([id number]) Visit Report for 06/02/2019 HPI Details Patient Name: Date of Service: [name], [name] 06/02/2019 10:15 AM Medical Record Number:[id number] Patient Account Number: [id number] Date of Birth/Sex: Treating RN: [birth year] (69 y.o. [name] Primary Care Provider: [name] Other Clinician: Referring Provider: Treating Provider/Extender:Delando Satter, [name], [name] in Treatment: 22 History of Present Illness Location: open ulceration of the left gluteal area, left heel and right ankle for about 5 months. Quality: Patient reports No Pain. Severity: Patient states wound(s) are getting worse. Duration: Patient has had the wound for > 5 months prior to seeking treatment at the wound center Context: The wound occurred when the patient has been paraplegic for about 3 years. Modifying Factors: Wound improving due to current treatment. Associated Signs and Symptoms: Patient reports having foul odor. HPI Description: this 69 year old patient who is known to have hypertension, hypothyroidism, breast cancer, chronic pain syndrome, paraplegia was noted to have a left gluteal decubitus ulcer and was brought into the hospital. During the course of her hospitalization she was debrided in the operating room by Dr. Leland Johns and had all the wounds sharply debrided. The debridement was done for the left ischial wound, the left heel wound and the right ankle wound. Bone cultures were taken at that time but were negative but clinically she was treated for osteomyelitis because of the probing down to bone and open exposed bone. Home health has been giving her antibioticss which include vancomycin and Zosyn. The patient was a smoker until about 3 weeks ago and used to smoke about 10 cigarettes a day for a long while. 12/13/2014 - details of her operative note from 11/03/2014 were reviewed -- PROCEDURE: 1. Excisional debridement skin, subcutaneous, muscle left ischium 35 cm2 2.  Excisional debridement skin, subcutaneous tissue left heel 27 cm2 3. Excisional debridement right ankle skin, subcutaneous, bone 30 cm2 01/24/2015 -- she has some issues with her wheelchair cushion but other than that is doing very well and has received Podus boots for her feet. 02/14/2015 -- she was using her old offloading boots and this seemed to have caused her a new pressure ulcer on the left posterior heel near the superior part just below the Achilles tendon. 03/07/2015 -- she has a new ulceration just to the left of the midline on her sacral region more on the left buttock and this has been there for about a week. 08/22/2015 -- was recently admitted to hospital between May 5 and 08/13/2015, with sepsis and leukocytosis due to a UTI. she was treated for a sepsis complicating Escherichia coli UTI and kidney stones. She also had metabolic and careful up at the secondary to pyelonephritis. He received broad-spectrum antibiotics initially and then received Macrobid as per urology. She was sent home on nitrofurantoin. during her admission she had a CT scan which showed exposed left ischial tuberosity without evidence of osteolysis. 09/12/2015-- the patient is having some issues with her air mattress and would like to get a opinion from medical modalities. 10/10/2015 -- the issue with her air mattress has not yet been sorted out and the new problem seems to be a lot of odor from the wound VAC. 11/27/2015 -- the patient was admitted to the hospital between July 23 and 10/31/2015. Her problems were sepsis, osteomyelitis of the pelvic bone and acute pyelonephritis. CT of the abdomen and pelvis was consistent with a left- sided pyelonephritis with hydronephrosis and also just showed new sclerosis of the posterior portion of the left anterior pubic ramus suggestive of  periosteal reaction consistent with osteomyelitis. She was treated for the osteomyelitis with infectious disease consult recommending  6 weeks of IV antibiotics including vancomycin and Rocephin and the antibiotics were to go on until 12/10/2015. He was seen by Dr. Iran Planas plastic surgery and Dr. Linus Salmons of infectious disease. She had a suprapubic catheter placed during the admission. CT scan done on 10/28/2015 showed specifically -- New sclerosis of the posterior portion of the left inferior pubic ramus with aggressive periosteal reaction, consistent with osteomyelitis, with adjacent soft tissue gas compatible with previously described decubitus ulcer. 12/12/2015 -- she was recently seen by Dr. Linus Salmons, who noted good improvement and CRP and ESR compared to before and he has stopped her antibiotic as per plans to finish on September 4. The patient was encouraged to continue with wound care and consider hyperbaric oxygen therapy. Today she tells me that she has consented to undergo hyperbaric oxygen therapy and we can start the paperwork. 01/02/2016 -- her PCP had gained about 3 years but she still persists in having problems during hyperbaric oxygen therapy with some discomfort in the ears. 01/09/16; pressure area with underlying osteomyelitis in the left buttock. Wound bed itself has some slight amount of grayish surface slough however I do not think any debridement was necessary. There is no exposed bone soft tissue appears stable. She is using a wound VAC 01/16/16; back for weekly wound review in conjunction with HBO. She has a deep wound over the left initial tuberosity previously treated with 6 weeks of IV antibiotics for osteomyelitis. Wound bed looks reasonably healthy although the base of this is still precariously close to bone. She has been using a wound VAC. 01/23/2016 -- she has completed her course of antibiotics and this week the only new thing is her right great toe nail was avulsed and she has got an open wound over the nailbed. 01/31/16 she has completed her course of antibiotics. Her right great toenail avulsed  last week and she's been using silver alginate for this as well. Still using a wound VAC to the substantial stage IV wound over the left ischial tuberosity 03/05/2016 -- the patient has had a opinion from the plastic surgery group at [facility] and details of this are not available yet but the patient's verbal report has been heard by me. Did not sound like there was any optimistic discussion regarding reconstruction and the net result would be to continue with the wound VAC application. I will await the official reports. Addendum: -- she was seen at [facility] surgery [facility] by Dr. Tressa Busman. After a thorough review and from what I understand spending 45 minutes with the patient his assessment has been noted by me in detail and the management options were: 1. Continued pressure offloading and wound care versus operative procedures including wound excision 2. Soft tissue and bone sampling 3. If the wound gets larger wound closure would be done using a variety of plastic surgical techniques including but not limited to skin substitute, possible skin graft, local versus regional flaps, negative pressure dressing application. 4. He discussed with her details of flap surgery and the risks associated 5. He made a comment that since the patient was operated on by Dr. Leland Johns of [facility] plastic surgery unit in [place] the patient may continue to follow-up there for further evaluation for surgical flap closure in the future. 03/19/2016 -- the patient continues to be rather depressed and frustrated with her lack of rapid progress in  healing this wound especially because she thought after hyperbaric oxygen therapy the wound would heal extremely fast. She now understands that was not the implied benefit on wound care which was the recommendation for hyperbaric oxygen therapy. I have had a lengthy discussion with the patient and her husband regarding her  options: 1. Continue with collagen and wound VAC for the primary dressing and offloading and all supportive care. 2. See Dr. Iran Planas for possible placement of Acell or Integra in the OR. 3. get a second opinion from a wound care center and surrounding regions/counties 05/07/2016 -- Note from Dr. Celedonio Miyamoto, who noted that the patient has declined flap surgery. She has discussed application of A cell, and try a few applications to see how the wound progresses. She is also recommended that we could apply products here in the wound center, like Oasis. during her preop workup it was found that her hemoglobin A1c was 11% and she has now been diagnosed as having diabetes mellitus and has been put on appropriate treatment by her PCP 05/28/2016 -- tells me her blood sugars have been doing well and she has an appointment to see her PCP in the next couple of weeks to check her hemoglobin A1c. Other than that she continues to do well. 06/25/2016 -- have not seen her back for the last month but she says her health has been about the same and she has an appointment to check the A1c next week 09/10/16 ---- was seen by Dr. Celedonio Miyamoto -- who applied Acell and saw her back in follow-up. She has recommended silver alginate to the wound every other day and cover with foam. If no significant drainage could transition to collagen every other day. She recommended discontinuing wound VAC. There were no plans to repeat application of Acell. The patient expressed that her husband could do the wound care as going to the Wound Ctr., would cost several $100 for each visit. 10/21/2016 -- her insurance company is getting her new mattress and she is pleased about that. Other than that she has been doing dressings with PolyMem Silver and has been doing very well 02/18/2017 -- she has gone through several changes of her mattress and has not been pleased with any of them. The ventricles are still working on trying to  fit her with the appropriate low air-loss mattress. She has a new wound on the gluteal area which is clearly separated from the original wound. 03/25/17-she is here in follow-up evaluation for her left ischialpressure ulcer. She remains unsatisfied with her pressure mattress. She admits to sitting multiple hours a day, in the bed. We have discussed offloading options. The wound does not appear infected. Nutrition does not appear to be a concern. Will follow-up in 4 weeks, if wound continues to be stalled may consider x-ray to evaluate for refractory osteomyelitis. 04/21/17; this is a patient that I don't know all that well. She has a chronic wound which at one point had underlying osteomyelitis in the left ischial tuberosity. This is a stage IV pressure ulcer. Over the last 3 months she has a stage II wound inferiorly to the original wound. The last time she was here her dressing was changed to silver collagen although the patient's husband who changes the dressing said that the collagen stuck to the wound and remove skin from the superficial area therefore he switched back to Effort 05/13/17; this is a patient we've been following for a left ischial tuberosity wound which was stage IV  at one point had underlying osteomyelitis. Over the last several months she's had a stage II wound just inferior and medial to the related to the wound. According to her husband he is using Endoform layer with collagen although this is not what I had last time. According to her husband they are using Elgie Congo with collagen although I don't quite know how that started. She was hospitalized from 1/20 through 04/30/16. This was related to a UTI. Her blood cultures were negative, urine culture showed multiple species. She did have a CT scan of the abdomen and pelvis which documented chronic osteomyelitis in the area of the wound inflammatory markers were unremarkable. She has had prior knowledge of osteomyelitis.  It looks as though she received IV antibiotics in 2017 and was treated with a course of hyperbaric oxygen. 05/28/17; the wound over the left ischial tuberosity is deeper today and abuts clearly on bone. Nursing intake reported drainage. I therefore culture of the wound. The more superficial area just below this looks about the same. They once again complained that there are mattress cover is not working although apparently [facility] is been noted to see this many times in the report is that the device is functional 06/18/17; the patient had a probing area on the left ischial tuberosity that was draining purulent fluid last time. This also clearly seemed to have open bone. Culture I did showed pansensitive pseudomonas including third generation cephalosporins. I treated this with cefdinir 300 twice a day for 10 days and things seem to have improved. She has a more superficial wound just underneath this area. Amazingly she has a new air mattress through [facility]. I think they gave this to her as a parking give. In any case this now works according to the patient may have something to do with why the areas are looking better. 07/09/17; the patient has a probing area in the left ischial tuberosity that still has some depth. However this is contracted in terms of the wound orifice although the depth is still roughly the same. There is no undermining. She also has the satellite wound which is more superficial. This appears to have a healthy surface we've been using silver collagen 08/06/17; the patient's wound is over the left ischial tuberosity and a satellite lesion just underneath this. The original wound was actually a deep stage 4 wound. We have made good progress in 2 months and there is no longer exposed bone here. 09/03/17; left ischial tuberosity actually appears to be quite healthy. I think we are making progress. No debridement is required. There is no surrounding  erythema 10/01/17 I follow this patient monthly for her left ischial tuberosity wound. There is 2 areas the original area and a satellite area. The satellite area looks a lot better there is no surrounding erythema. Her husband relates that he is having trouble maintaining the dressing. This has to do with the soft tissue around it. He states he puts the collagen in but he cannot make sure that it stays in even with the ABD pads and tape that he is been using 10/29/17; patient arrives with a better looking noon today. Some of the satellite lesions have closed. using Prisma 11/26/17; the patient has a large cone-shaped area with the tip of the Cone deep within her buttock soft tissue. The walls of the Cone are epithelialized however the base is still open. The area at the base of this looks moist we've been using silver collagen. Will  change to silver alginate 12/31/2017; the wound appears to have come in fairly nicely. Using silver alginate. There is no surrounding maceration or infection 01/28/18; there is still an open area here over the left initial tuberosity. Base of this however looks healthy. There is no surrounding infection 02/25/18; the area of its open is over the left ischial tuberosity. The base of this is where the wound is. This is a large inverted cone-shaped area with the wound at the tip. Dimensions of the wound at the tip are improved. There is a area of denuded skin about halfway towards the tip which her husband thinks may have happened today when he was bathing her. 04/20/17; the area is still open over the left initial tuberosity. 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 This is now completely closed over although it is closed over with a divot and skin over bone at the base of this.  Continued aggressive offloading will be necessary. 12/30/2018 READMISSION This is a 69 year old woman with chronic paraplegia. I picked her up for her care from Dr. Con Memos in this clinic after he departed. She had a stage IV pressure wound over the left ischial tuberosity. She was treated twice for her underlying osteomyelitis and this I believe firstly in 2016 and again in 2017. There were some plans at some point for flap closure of this however she was discovered to have uncontrolled diabetes and I do not think this was ever accomplished. She ultimately healed over in this clinic and was discharged in May. She has a large cone-shaped indentation with the tip of this going towards the left ischial tuberosity. It is not an easy area to examine but at that time I thought all of this was epithelialized. Apparently there was a reopening here shortly after she left the clinic last time. She was admitted to hospital at the end of June for Klebsiella bacteremia felt to be secondary to UTI. A CT scan of the pelvis is listed below and there was initially some concern that she had underlying osteomyelitis although I believe she was seen by infectious disease and that was felt to be not the case: I do not see any new cultures or inflammatory markers IMPRESSION: 1. No CT evidence for acute intra-abdominal or pelvic abnormality. Large volume of stool throughout the colon. 2. Enlarged fatty liver with fat sparing near the gallbladder fossa 3. Cortical scarring right kidney. Bilateral intrarenal stones without hydronephrosis. Thick-walled urinary bladder decompressed by suprapubic catheter 4. Deep left decubitus ulcer with underlying left ischial changes suggesting osteomyelitis. Her husband has been using silver collagen in the wound. She has not been systemically unwell no fever chills eating and drinking well. They rigorously offload this wound only getting up in the wheelchair when she is going  to appointments the rest of the time she is in bed. 10/8; wound measures larger and she now has exposed bone. We have been using silver alginate 11/12 still using silver alginate. The patient saw Dr. Megan Salon of infectious disease. She was started on Augmentin 500 mg twice daily. She is due to follow-up with Dr. Megan Salon I believe next week. Lab work Dr. Megan Salon requested showed a sedimentation rate of 28 and CRP of 20 although her CRP 1 year ago was 18.8. Sedimentation rate 1 year ago  was 11 basic metabolic panel showed a creatinine of 1.12 12/3; the patient followed up with Dr. Megan Salon yesterday. She is still on Augmentin twice daily. This was directed by Dr. Megan Salon. The patient's inflammatory markers have improved which is gratifying. Her C-reactive protein was repeated yesterday and follow-up booked with infectious disease in January. In addition I have been getting secure text messages I think from palliative care through [facility] [employer]. I think they were hoping to provide services to the patient in her home. They could not get a hold of the primary physician and so they reached out to me on 2 separate occasions. 12/17; patient last saw Dr. Megan Salon on 12/2. She is finishing up with Augmentin. Her C-reactive protein was 20 on 10/21, 10.1 on 11/19 and 17 on 12/2. The wound itself still has depth and undermining. We are using Santyl with the backing wet-to-dry 04/27/2019. The wound is gradually clearing up in terms of the surface although it is not filled in that much. Still abuts right against bone 2/4; patient with a deep pressure ulcer over the left ischial tuberosity. I thought she was going to follow-up with infectious disease to follow her inflammatory markers although the patient states that they stated that they did not need to see her unless we felt it was necessary. I will need to check their notes. In any case we ordered moistened silver collagen back  with wet-to-dry to fill in the depth of the wound although apparently prism sent silver alginate which they have been using since they were here the last time. Is obviously not what we ordered. 2/25. Not much change in this wound it is over the left ischial tuberosity recurrent wound. We have been using silver collagen with backing wet-to-dry. I think the wound is about the same. There is still some tunneling from about 10- 12 o'clock over the ischial tuberosity itself Electronic Signature(s) Signed: 06/02/2019 5:41:09 PM By: Linton Ham MD Entered By: Linton Ham on 06/02/2019 12:55:21 -------------------------------------------------------------------------------- Physical Exam Details Patient Name: Date of Service: [name], [name] 06/02/2019 10:15 AM Medical Record [id number] Patient Account Number: [id number] Date of Birth/Sex: Treating RN: May 05, 1950 (69 y.o. [name] Primary Care Provider: [name] Other Clinician: Referring Provider: Treating Provider/Extender:Lori-Ann Lindfors, [name], [name] in Treatment: 50 Constitutional Patient is hypertensive.. Pulse regular and within target range for patient.Marland Kitchen Respirations regular, non-labored and within target range.. Temperature is normal and within the target range for the patient.Marland Kitchen Appears in no distress. Notes Wound exam; left ischial tuberosity not much change of this wound there is still a deep tunnel from 10-12 o'clock around the ischial tuberosity itself. No surrounding soft tissue infection is evident there is no purulent drainage Electronic Signature(s) Signed: 06/02/2019 5:41:09 PM By: Linton Ham MD Entered By: Linton Ham on 06/02/2019 12:59:01 -------------------------------------------------------------------------------- Physician Orders Details Patient Name: Date of Service: [name], [name] 06/02/2019 10:15 AM Medical Record [id number] Patient Account Number: [id number] Date of  Birth/Sex: Treating RN: 14-Jan-1951 (69 y.o. [name], [name] Primary Care Provider: [name] Other Clinician: Referring Provider: Treating Provider/Extender:Rocklyn Mayberry, [name], [name] in Treatment: 22 Verbal / Phone Orders: No Diagnosis Coding ICD-10 Coding Code Description 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, stage 4 G82.20 Paraplegia, unspecified E11.622 Type 2 diabetes mellitus with other skin ulcer M86.68 Other chronic osteomyelitis, other site Follow-up Appointments Return Appointment in 2 weeks. - Thursday Dressing Change Frequency Change dressing three times week. - wound vac Skin Barriers/Peri-Wound Care Skin Prep - to periwound. Barrier  cream - as needed for maceration, wetness, or redness to periwound. Wound Cleansing Wound #12 Left Ischial Tuberosity May shower and wash wound with soap and water. - or wound cleanser Primary Wound Dressing Wound #12 Left Ischial Tuberosity Skin Substitute Application - wound center to run insurance for approval of Primatrix. Silver Collagen - apply to wound bed moisten with hydrogel. back with saline or hydrogel moisten gauze. back with dry gauze. Use this dressing until wound vac arrives and change this dressing daily until wound vac arrives. Secondary Dressing Wound #12 Left Ischial Tuberosity ABD pad - or bordered foam. use until wound vac arrives. Negative Presssure Wound Therapy Wound #12 Left Ischial Tuberosity Wound Vac to wound continuously at 141m/hg pressure Black Foam Off-Loading Low air-loss mattress (Group 2) - continue to use Gel wheelchair cushion - continue to use specialty wheelchair cushion. Turn and reposition every 2 [facility] patient and change wound vac dressing three times a week. Electronic Signature(s) Signed: 06/02/2019 5:41:09 PM By: RLinton HamMD Signed: 06/02/2019 6:07:18 PM By: DDeon PillingEntered By:  DDeon Pillingon 06/02/2019 11:59:06 -------------------------------------------------------------------------------- Problem List Details Patient Name: Date of Service: CAVYANA, PUFFENBARGER2/25/2021 10:15 AM Medical Record [id number] Account Number: [id number] of Birth/Sex: Treating RN: [birth year](69y.o. FHelene [name] BTammi KlippelPrimary Care Provider: Other Clinician: SSandi MariscalReferring Provider: Treating Provider/Extender:Oswin Griffith, MStark Klein YKurtis Bushmanin Treatment: 22 Active Problems ICD-10 Evaluated Encounter Code Description Active Date Today Diagnosis 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, stage 4 12/30/2018 No Yes G82.20 Paraplegia, unspecified 12/30/2018 No Yes E11.622 Type 2 diabetes mellitus with other skin ulcer 12/30/2018 No Yes M86.68 Other chronic osteomyelitis, other site 02/17/2019 No Yes Inactive Problems Resolved Problems Electronic Signature(s) Signed: 06/02/2019 5:41:09 PM By: RLinton HamMD Entered By: RLinton Hamon 06/02/2019 12:53:56 -------------------------------------------------------------------------------- Progress Note Details Patient Name: Date of Service: CWAFAA, DEEMER2/25/2021 10:15 AM Medical Record [id number] Account Number: [id number] of Birth/Sex: Treating RN: [birth year](69y.o. FHelene [name] BTammi KlippelPrimary Care Provider: SSandi MariscalOther Clinician: Referring Provider: Treating Provider/Extender:Luberta Grabinski, MStark Klein YKurtis Bushmanin Treatment: 22 Subjective History of Present Illness (HPI) The following HPI elements were documented for the patient's wound: Location: open ulceration of the left gluteal area, left heel and right ankle for about 5 months. Quality: Patient reports No Pain. Severity: Patient states wound(s) are getting worse. Duration: Patient has had the wound for > 5 months prior to seeking treatment at the wound center Context: The wound occurred when the patient has been paraplegic for  about 3 years. Modifying Factors: Wound improving due to current treatment. Associated Signs and Symptoms: Patient reports having foul odor. this 69year old patient who is known to have hypertension, hypothyroidism, breast cancer, chronic pain syndrome, paraplegia was noted to have a left gluteal decubitus ulcer and was brought into the hospital. During the course of her hospitalization she was debrided in the operating room by Dr. TLeland Johnsand had all the wounds sharply debrided. The debridement was done for the left ischial wound, the left heel wound and the right ankle wound. Bone cultures were taken at that time but were negative but clinically she was treated for osteomyelitis because of the probing down to bone and open exposed bone. Home health has been giving her antibioticss which include vancomycin and Zosyn. The patient was a smoker until about 3 weeks ago and used to smoke about 10 cigarettes a day for a  long while. 12/13/2014 - details of her operative note from 11/03/2014 were reviewed -- PROCEDURE: 1. Excisional debridement skin, subcutaneous, muscle left ischium 35 cm2 2. Excisional debridement skin, subcutaneous tissue left heel 27 cm2 3. Excisional debridement right ankle skin, subcutaneous, bone 30 cm2 01/24/2015 -- she has some issues with her wheelchair cushion but other than that is doing very well and has received Podus boots for her feet. 02/14/2015 -- she was using her old offloading boots and this seemed to have caused her a new pressure ulcer on the left posterior heel near the superior part just below the Achilles tendon. 03/07/2015 -- she has a new ulceration just to the left of the midline on her sacral region more on the left buttock and this has been there for about a week. 08/22/2015 -- was recently admitted to hospital between May 5 and 08/13/2015, with sepsis and leukocytosis due to a UTI. she was treated for a sepsis complicating Escherichia coli UTI and  kidney stones. She also had metabolic and careful up at the secondary to pyelonephritis. He received broad-spectrum antibiotics initially and then received Macrobid as per urology. She was sent home on nitrofurantoin. during her admission she had a CT scan which showed exposed left ischial tuberosity without evidence of osteolysis. 09/12/2015-- the patient is having some issues with her air mattress and would like to get a opinion from medical modalities. 10/10/2015 -- the issue with her air mattress has not yet been sorted out and the new problem seems to be a lot of odor from the wound VAC. 11/27/2015 -- the patient was admitted to the hospital between July 23 and 10/31/2015. Her problems were sepsis, osteomyelitis of the pelvic bone and acute pyelonephritis. CT of the abdomen and pelvis was consistent with a left- sided pyelonephritis with hydronephrosis and also just showed new sclerosis of the posterior portion of the left anterior pubic ramus suggestive of periosteal reaction consistent with osteomyelitis. She was treated for the osteomyelitis with infectious disease consult recommending 6 weeks of IV antibiotics including vancomycin and Rocephin and the antibiotics were to go on until 12/10/2015. He was seen by Dr. Iran Planas plastic surgery and Dr. Linus Salmons of infectious disease. She had a suprapubic catheter placed during the admission. CT scan done on 10/28/2015 showed specifically -- New sclerosis of the posterior portion of the left inferior pubic ramus with aggressive periosteal reaction, consistent with osteomyelitis, with adjacent soft tissue gas compatible with previously described decubitus ulcer. 12/12/2015 -- she was recently seen by Dr. Linus Salmons, who noted good improvement and CRP and ESR compared to before and he has stopped her antibiotic as per plans to finish on September 4. The patient was encouraged to continue with wound care and consider hyperbaric oxygen therapy. Today she  tells me that she has consented to undergo hyperbaric oxygen therapy and we can start the paperwork. 01/02/2016 -- her PCP had gained about 3 years but she still persists in having problems during hyperbaric oxygen therapy with some discomfort in the ears. 01/09/16; pressure area with underlying osteomyelitis in the left buttock. Wound bed itself has some slight amount of grayish surface slough however I do not think any debridement was necessary. There is no exposed bone soft tissue appears stable. She is using a wound VAC 01/16/16; back for weekly wound review in conjunction with HBO. She has a deep wound over the left initial tuberosity previously treated with 6 weeks of IV antibiotics for osteomyelitis. Wound bed looks reasonably healthy although the base  of this is still precariously close to bone. She has been using a wound VAC. 01/23/2016 -- she has completed her course of antibiotics and this week the only new thing is her right great toe nail was avulsed and she has got an open wound over the nailbed. 01/31/16 she has completed her course of antibiotics. Her right great toenail avulsed last week and she's been using silver alginate for this as well. Still using a wound VAC to the substantial stage IV wound over the left ischial tuberosity 03/05/2016 -- the patient has had a opinion from the plastic surgery group at [facility] and details of this are not available yet but the patient's verbal report has been heard by me. Did not sound like there was any optimistic discussion regarding reconstruction and the net result would be to continue with the wound VAC application. I will await the official reports. Addendum: -- she was seen at [place] surgery [facility] by Dr. Tressa Busman. After a thorough review and from what I understand spending 45 minutes with the patient his assessment has been noted by me in detail and the management options were: 1. Continued pressure  offloading and wound care versus operative procedures including wound excision 2. Soft tissue and bone sampling 3. If the wound gets larger wound closure would be done using a variety of plastic surgical techniques including but not limited to skin substitute, possible skin graft, local versus regional flaps, negative pressure dressing application. 4. He discussed with her details of flap surgery and the risks associated 5. He made a comment that since the patient was operated on by Dr. Leland Johns of [facility] plastic surgery unit in [place] the patient may continue to follow-up there for further evaluation for surgical flap closure in the future. 03/19/2016 -- the patient continues to be rather depressed and frustrated with her lack of rapid progress in healing this wound especially because she thought after hyperbaric oxygen therapy the wound would heal extremely fast. She now understands that was not the implied benefit on wound care which was the recommendation for hyperbaric oxygen therapy. I have had a lengthy discussion with the patient and her husband regarding her options: 1. Continue with collagen and wound VAC for the primary dressing and offloading and all supportive care. 2. See Dr. Iran Planas for possible placement of Acell or Integra in the OR. 3. get a second opinion from a wound care center and surrounding regions/counties 05/07/2016 -- Note from Dr. Celedonio Miyamoto, who noted that the patient has declined flap surgery. She has discussed application of A cell, and try a few applications to see how the wound progresses. She is also recommended that we could apply products here in the wound center, like Oasis. during her preop workup it was found that her hemoglobin A1c was 11% and she has now been diagnosed as having diabetes mellitus and has been put on appropriate treatment by her PCP 05/28/2016 -- tells me her blood sugars have been doing well and she has an  appointment to see her PCP in the next couple of weeks to check her hemoglobin A1c. Other than that she continues to do well. 06/25/2016 -- have not seen her back for the last month but she says her health has been about the same and she has an appointment to check the A1c next week 09/10/16 ---- was seen by Dr. Celedonio Miyamoto -- who applied Acell and saw her back in follow-up. She has recommended  silver alginate to the wound every other day and cover with foam. If no significant drainage could transition to collagen every other day. She recommended discontinuing wound VAC. There were no plans to repeat application of Acell. The patient expressed that her husband could do the wound care as going to the Wound Ctr., would cost several $100 for each visit. 10/21/2016 -- her insurance company is getting her new mattress and she is pleased about that. Other than that she has been doing dressings with PolyMem Silver and has been doing very well 02/18/2017 -- she has gone through several changes of her mattress and has not been pleased with any of them. The ventricles are still working on trying to fit her with the appropriate low air-loss mattress. She has a new wound on the gluteal area which is clearly separated from the original wound. 03/25/17-she is here in follow-up evaluation for her left ischialpressure ulcer. She remains unsatisfied with her pressure mattress. She admits to sitting multiple hours a day, in the bed. We have discussed offloading options. The wound does not appear infected. Nutrition does not appear to be a concern. Will follow-up in 4 weeks, if wound continues to be stalled may consider x-ray to evaluate for refractory osteomyelitis. 04/21/17; this is a patient that I don't know all that well. She has a chronic wound which at one point had underlying osteomyelitis in the left ischial tuberosity. This is a stage IV pressure ulcer. Over the last 3 months she has a stage II wound  inferiorly to the original wound. The last time she was here her dressing was changed to silver collagen although the patient's husband who changes the dressing said that the collagen stuck to the wound and remove skin from the superficial area therefore he switched back to [place] 05/13/17; this is a patient we've been following for a left ischial tuberosity wound which was stage IV at one point had underlying osteomyelitis. Over the last several months she's had a stage II wound just inferior and medial to the related to the wound. According to her husband he is using Endoform layer with collagen although this is not what I had last time. According to her husband they are using Elgie Congo with collagen although I don't quite know how that started. She was hospitalized from 1/20 through 04/30/16. This was related to a UTI. Her blood cultures were negative, urine culture showed multiple species. She did have a CT scan of the abdomen and pelvis which documented chronic osteomyelitis in the area of the wound inflammatory markers were unremarkable. She has had prior knowledge of osteomyelitis. It looks as though she received IV antibiotics in 2017 and was treated with a course of hyperbaric oxygen. 05/28/17; the wound over the left ischial tuberosity is deeper today and abuts clearly on bone. Nursing intake reported drainage. I therefore culture of the wound. The more superficial area just below this looks about the same. They once again complained that there are mattress cover is not working although apparently [facility] is been noted to see this many times in the report is that the device is functional 06/18/17; the patient had a probing area on the left ischial tuberosity that was draining purulent fluid last time. This also clearly seemed to have open bone. Culture I did showed pansensitive pseudomonas including third generation cephalosporins. I treated this with cefdinir 300 twice a  day for 10 days and things seem to have improved. She has a more superficial wound  just underneath this area. Amazingly she has a new air mattress through [facility]. I think they gave this to her as a parking give. In any case this now works according to the patient may have something to do with why the areas are looking better. 07/09/17; the patient has a probing area in the left ischial tuberosity that still has some depth. However this is contracted in terms of the wound orifice although the depth is still roughly the same. There is no undermining. ooShe also has the satellite wound which is more superficial. This appears to have a healthy surface we've been using silver collagen 08/06/17; the patient's wound is over the left ischial tuberosity and a satellite lesion just underneath this. The original wound was actually a deep stage 4 wound. We have made good progress in 2 months and there is no longer exposed bone here. 09/03/17; left ischial tuberosity actually appears to be quite healthy. I think we are making progress. No debridement is required. There is no surrounding erythema 10/01/17 I follow this patient monthly for her left ischial tuberosity wound. There is 2 areas the original area and a satellite area. The satellite area looks a lot better there is no surrounding erythema. Her husband relates that he is having trouble maintaining the dressing. This has to do with the soft tissue around it. He states he puts the collagen in but he cannot make sure that it stays in even with the ABD pads and tape that he is been using 10/29/17; patient arrives with a better looking noon today. Some of the satellite lesions have closed. using Prisma 11/26/17; the patient has a large cone-shaped area with the tip of the Cone deep within her buttock soft tissue. The walls of the Cone are epithelialized however the base is still open. The area at the base of this looks moist we've been using silver  collagen. Will change to silver alginate 12/31/2017; the wound appears to have come in fairly nicely. Using silver alginate. There is no surrounding maceration or infection 01/28/18; there is still an open area here over the left initial tuberosity. Base of this however looks healthy. There is no surrounding infection 02/25/18; the area of its open is over the left ischial tuberosity. The base of this is where the wound is. This is a large inverted cone-shaped area with the wound at the tip. Dimensions of the wound at the tip are improved. There is a area of denuded skin about halfway towards the tip which her husband thinks may have happened today when he was bathing her. 04/20/17; the area is still open over the left initial tuberosity. 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 This is now completely closed over although it is closed over with a divot and skin over bone at the base of this. Continued aggressive offloading will be necessary. 12/30/2018 READMISSION This is a 69 year old woman with chronic paraplegia. I picked her up for her care from Dr. Con Memos in this clinic after he departed. She had a stage IV pressure wound over the left ischial tuberosity. She was treated twice for her underlying osteomyelitis and this I believe firstly in 2016 and again in 2017. There were some plans at some point for flap closure of this however she was discovered to  have uncontrolled diabetes and I do not think this was ever accomplished. She ultimately healed over in this clinic and was discharged in May. She has a large cone-shaped indentation with the tip of this going towards the left ischial tuberosity. It is not an easy area to examine but at that time I thought all of this was epithelialized. Apparently  there was a reopening here shortly after she left the clinic last time. She was admitted to hospital at the end of June for Klebsiella bacteremia felt to be secondary to UTI. A CT scan of the pelvis is listed below and there was initially some concern that she had underlying osteomyelitis although I believe she was seen by infectious disease and that was felt to be not the case: I do not see any new cultures or inflammatory markers IMPRESSION: 1. No CT evidence for acute intra-abdominal or pelvic abnormality. Large volume of stool throughout the colon. 2. Enlarged fatty liver with fat sparing near the gallbladder fossa 3. Cortical scarring right kidney. Bilateral intrarenal stones without hydronephrosis. Thick-walled urinary bladder decompressed by suprapubic catheter 4. Deep left decubitus ulcer with underlying left ischial changes suggesting osteomyelitis. Her husband has been using silver collagen in the wound. She has not been systemically unwell no fever chills eating and drinking well. They rigorously offload this wound only getting up in the wheelchair when she is going to appointments the rest of the time she is in bed. 10/8; wound measures larger and she now has exposed bone. We have been using silver alginate 11/12 still using silver alginate. The patient saw Dr. Megan Salon of infectious disease. She was started on Augmentin 500 mg twice daily. She is due to follow-up with Dr. Megan Salon I believe next week. Lab work Dr. Megan Salon requested showed a sedimentation rate of 28 and CRP of 20 although her CRP 1 year ago was 18.8. Sedimentation rate 1 year ago was 11 basic metabolic panel showed a creatinine of 1.12 12/3; the patient followed up with Dr. Megan Salon yesterday. She is still on Augmentin twice daily. This was directed by Dr. Megan Salon. The patient's inflammatory markers have improved which is gratifying. Her C-reactive protein was repeated yesterday and follow-up booked with  infectious disease in January. In addition I have been getting secure text messages I think from palliative care through [facility] [employer]. I think they were hoping to provide services to the patient in her home. They could not get a hold of the primary physician and so they reached out to me on 2 separate occasions. 12/17; patient last saw Dr. Megan Salon on 12/2. She is finishing up with Augmentin. Her C-reactive protein was 20 on 10/21, 10.1 on 11/19 and 17 on 12/2. The wound itself still has depth and undermining. We are using Santyl with the backing wet-to-dry 04/27/2019. The wound is gradually clearing up in terms of the surface although it is not filled in that much. Still abuts right against bone 2/4; patient with a deep pressure ulcer over the left ischial tuberosity. I thought she was going to follow-up with infectious disease to follow her inflammatory markers although the patient states that they stated that they did not need to see her unless we felt it was necessary. I will need to check their notes. In any case we ordered moistened silver collagen back with wet-to-dry to fill in the depth of the wound although apparently prism sent silver alginate which they have been using since they were here the last time.  Is obviously not what we ordered. 2/25. Not much change in this wound it is over the left ischial tuberosity recurrent wound. We have been using silver collagen with backing wet-to-dry. I think the wound is about the same. There is still some tunneling from about 10- 12 o'clock over the ischial tuberosity itself Objective Constitutional Patient is hypertensive.. Pulse regular and within target range for patient.Marland Kitchen Respirations regular, non-labored and within target range.. Temperature is normal and within the target range for the patient.Marland Kitchen Appears in no distress. Vitals Time Taken: 10:39 AM, Height: 63 in, Weight: 185 lbs, BMI: 32.8, Temperature: 97.8 F,  Pulse: 83 bpm, Respiratory Rate: 18 breaths/min, Blood Pressure: 103/56 mmHg. General Notes: Wound exam; left ischial tuberosity not much change of this wound there is still a deep tunnel from 10-12 o'clock around the ischial tuberosity itself. No surrounding soft tissue infection is evident there is no purulent drainage Integumentary (Hair, Skin) Wound #12 status is Open. Original cause of wound was Pressure Injury. The wound is located on the Left Ischial Tuberosity. The wound measures 3.5cm length x 2.5cm width x 2.2cm depth; 6.872cm^2 area and 15.119cm^3 volume. There is Fat Layer (Subcutaneous Tissue) Exposed exposed. There is no tunneling or undermining noted. There is a medium amount of serosanguineous drainage noted. The wound margin is well defined and not attached to the wound base. There is medium (34-66%) pink granulation within the wound bed. There is a medium (34-66%) amount of necrotic tissue within the wound bed including Adherent Slough. Assessment Active Problems ICD-10 Pressure ulcer of left buttock, stage 4 Paraplegia, unspecified Type 2 diabetes mellitus with other skin ulcer Other chronic osteomyelitis, other site Plan Follow-up Appointments: Return Appointment in 2 weeks. - Thursday Dressing Change Frequency: Change dressing three times week. - wound vac Skin Barriers/Peri-Wound Care: Skin Prep - to periwound. Barrier cream - as needed for maceration, wetness, or redness to periwound. Wound Cleansing: Wound #12 Left Ischial Tuberosity: May shower and wash wound with soap and water. - or wound cleanser Primary Wound Dressing: Wound #12 Left Ischial Tuberosity: Skin Substitute Application - wound center to run insurance for approval of Primatrix. Silver Collagen - apply to wound bed moisten with hydrogel. back with saline or hydrogel moisten gauze. back with dry gauze. Use this dressing until wound vac arrives and change this dressing daily until wound vac  arrives. Secondary Dressing: Wound #12 Left Ischial Tuberosity: ABD pad - or bordered foam. use until wound vac arrives. Negative Presssure Wound Therapy: Wound #12 Left Ischial Tuberosity: Wound Vac to wound continuously at 165m/hg pressure Black Foam Off-Loading: Low air-loss mattress (Group 2) - continue to use Gel wheelchair cushion - continue to use specialty wheelchair cushion. Turn and reposition every 2 hours Home Health: [facility] to [facility] patient and change wound vac dressing three times a week. 1. I continued with the same dressing silver collagen backing wet-to-dry 2. We will see if we can arrange a wound VAC. There are 2 issues here firstly whether the wound VAC will be approved by [facility] and then the second larger issue was whether we can get home health out to change the wound VAC through [facility]. 3. She has completed infectious disease. I still have not read their last note she is not on antibiotics. I will try to review this Electronic Signature(s) Signed: 06/02/2019 5:41:09 PM By: RLinton HamMD Entered By: RLinton Hamon 06/02/2019 13:01:03 -------------------------------------------------------------------------------- SuperBill Details Patient Name:  Date of Service: [name], [name] 06/02/2019 Medical Record [id number] Patient Account Number: [id number] Date of Birth/Sex: Treating RN: Mar 18, 1951 (69 y.o. [name], [name] Primary Care Provider: [name] Other Clinician: Referring Provider: Treating Provider/Extender:Roxanna Mcever, [name], [name] in Treatment: 22 Diagnosis Coding ICD-10 Codes Code Description [phone] Pressure ulcer of left buttock, stage 4 G82.20 Paraplegia, unspecified E11.622 Type 2 diabetes mellitus with other skin ulcer M86.68 Other chronic osteomyelitis, other site Facility Procedures The patient participates with Medicare or their insurance  follows the Medicare Facility Guidelines: CPT4 Code Description Modifier Quantity [id number] [place] VISIT-LEV 4 EST PT 1 Physician Procedures CPT4 Code: 1638466 Description: 59935 - WC PHYS LEVEL 3 - EST PT ICD-10 Diagnosis Description 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

## 2019-06-16 ENCOUNTER — Other Ambulatory Visit: Payer: Self-pay

## 2019-06-16 ENCOUNTER — Encounter (HOSPITAL_BASED_OUTPATIENT_CLINIC_OR_DEPARTMENT_OTHER): Payer: Medicare Other | Attending: Internal Medicine | Admitting: Internal Medicine

## 2019-06-16 DIAGNOSIS — E039 Hypothyroidism, unspecified: Secondary | ICD-10-CM | POA: Insufficient documentation

## 2019-06-16 DIAGNOSIS — N200 Calculus of kidney: Secondary | ICD-10-CM | POA: Insufficient documentation

## 2019-06-16 DIAGNOSIS — Z87891 Personal history of nicotine dependence: Secondary | ICD-10-CM | POA: Insufficient documentation

## 2019-06-16 DIAGNOSIS — L89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, stage 4: Secondary | ICD-10-CM | POA: Insufficient documentation

## 2019-06-16 DIAGNOSIS — I10 Essential (primary) hypertension: Secondary | ICD-10-CM | POA: Insufficient documentation

## 2019-06-16 DIAGNOSIS — G8220 Paraplegia, unspecified: Secondary | ICD-10-CM | POA: Diagnosis not present

## 2019-06-16 DIAGNOSIS — Z853 Personal history of malignant neoplasm of breast: Secondary | ICD-10-CM | POA: Diagnosis not present

## 2019-06-16 DIAGNOSIS — E11622 Type 2 diabetes mellitus with other skin ulcer: Secondary | ICD-10-CM | POA: Insufficient documentation

## 2019-06-16 DIAGNOSIS — G894 Chronic pain syndrome: Secondary | ICD-10-CM | POA: Insufficient documentation

## 2019-06-16 DIAGNOSIS — F329 Major depressive disorder, single episode, unspecified: Secondary | ICD-10-CM | POA: Diagnosis not present

## 2019-06-16 DIAGNOSIS — M8668 Other chronic osteomyelitis, other site: Secondary | ICD-10-CM | POA: Diagnosis not present

## 2019-06-16 DIAGNOSIS — K760 Fatty (change of) liver, not elsewhere classified: Secondary | ICD-10-CM | POA: Diagnosis not present

## 2019-06-18 NOTE — Progress Notes (Signed)
[name], [name] ([id number]) Visit Report for 06/16/2019 Arrival Information Details Patient Name: Date of Service: [name], [name] 06/16/2019 10:00 AM Medical Record Number:[id number] Patient Account Number: [id number] Date of Birth/Sex: Treating RN: 1950/11/21 (69 y.o. [name] Primary Care [name]: [name] Other Clinician: Referring [name]: Treating [name]/Extender:[name], [name], [name] in Treatment: 24 Visit Information History Since Last Visit Added or deleted any medications: No Patient Arrived: Wheel Chair Any new allergies or adverse reactions: No Arrival Time: 10:24 Had a fall or experienced change in No activities of daily living that may affect Accompanied By: husband risk of falls: Transfer Assistance: Manual Signs or symptoms of abuse/neglect since last No Patient Identification Verified: Yes visito Secondary Verification Process Completed: Yes Hospitalized since last visit: No Patient Requires Transmission-Based No Has Dressing in Place as Prescribed: Yes Precautions: Pain Present Now: No Patient Has Alerts: No Electronic Signature(s) Signed: 06/16/2019 6:03:12 PM By: Kela Millin Entered By: Kela Millin on 06/16/2019 10:24:56 -------------------------------------------------------------------------------- Clinic Level of Care Assessment Details Patient Name: Date of Service: [name] 06/16/2019 10:00 AM Medical Record Number:[id number] Patient Account Number: [id number] Date of Birth/Sex: Treating RN: 1950/10/30 (69 y.o. [name] Primary Care Jnae Thomaston: [name] Other Clinician: Referring Panagiota Perfetti: Treating [name]/Extender:[name], [name], [name] in Treatment: 24 Clinic Level of Care Assessment Items TOOL 4 Quantity Score X - Use when only an EandM is performed on FOLLOW-UP visit 1 0 ASSESSMENTS - Nursing Assessment / Reassessment X - Reassessment of Co-morbidities (includes updates in patient  status) 1 10 X - Reassessment of Adherence to Treatment Plan 1 5 ASSESSMENTS - Wound and Skin Assessment / Reassessment _0  - Simple Wound Assessment / Reassessment - one wound 0 X - Complex Wound Assessment / Reassessment - multiple wounds 1 5 X - Dermatologic / Skin Assessment (not related to wound area) 1 10 ASSESSMENTS - Focused Assessment _1  - Circumferential Edema Measurements - multi extremities 0 X - Nutritional Assessment / Counseling / Intervention 1 10 _2  - Lower Extremity Assessment (monofilament, tuning fork, pulses) 0 _3  - Peripheral Arterial Disease Assessment (using hand held doppler) 0 ASSESSMENTS - Ostomy and/or Continence Assessment and Care _4  - Incontinence Assessment and Management 0 _5  - Ostomy Care Assessment and Management (repouching, etc.) 0 PROCESS - Coordination of Care _6  - Simple Patient / Family Education for ongoing care 0 X - Complex (extensive) Patient / Family Education for ongoing care 1 20 X - Staff obtains Programmer, systems, Records, Test Results / Process Orders 1 10 X - Staff telephones HHA, Nursing Homes / Clarify orders / etc 1 10 _7  - Routine Transfer to another Facility (non-emergent condition) 0 _8  - Routine Hospital Admission (non-emergent condition) 0 _9  - New Admissions / Biomedical engineer / Ordering NPWT, Apligraf, etc. 0 _10  - Emergency Hospital Admission (emergent condition) 0 _11  - Simple Discharge Coordination 0 X - Complex (extensive) Discharge Coordination 1 15 PROCESS - Special Needs _12  - Pediatric / Minor Patient Management 0 _13  - Isolation Patient Management 0 _14  - Hearing / Language / Visual special needs 0 _15  - Assessment of Community assistance (transportation, D/C planning, etc.) 0 _16  - Additional assistance / Altered mentation 0 _17  - Support Surface(s) Assessment (bed, cushion, seat, etc.) 0 INTERVENTIONS - Wound Cleansing / Measurement _18  - Simple Wound Cleansing - one wound 0 X - Complex Wound Cleansing - multiple wounds 1  5 X - Wound Imaging (photographs - any number of wounds) 1 5 _19  - Wound Tracing (instead of photographs) 0 _20  - Simple Wound  Measurement - one wound 0 X - Complex Wound Measurement - multiple wounds 1 5 INTERVENTIONS - Wound Dressings _0  - Small Wound Dressing one or multiple wounds 0 X - Medium Wound Dressing one or multiple wounds 1 15 _1  - Large Wound Dressing one or multiple wounds 0 <DVVOHYWVPXTGGYIR>_4<\/WNIOEVOJJKKXFGHW>_2  - Application of Medications - topical 0 <XHBZJIRCVELFYBOF>_7<\/PZWCHENIDPOEUMPN>_3  - Application of Medications - injection 0 INTERVENTIONS - Miscellaneous _4  - External ear exam 0 _5  - Specimen Collection (cultures, biopsies, blood, body fluids, etc.) 0 _6  - Specimen(s) / Culture(s) sent or taken to Lab for analysis 0 _7  - Patient Transfer (multiple staff / Civil Service fast streamer / Similar devices) 0 _8  - Simple Staple / Suture removal (25 or less) 0 _9  - Complex Staple / Suture removal (26 or more) 0 _10  - Hypo / Hyperglycemic Management (close monitor of Blood Glucose) 0 _11  - Ankle / Brachial Index (ABI) - do not check if billed separately 0 X - Vital Signs 1 5 Has the patient been seen at the hospital within the last three years: Yes Total Score: 130 Level Of Care: New/Established - Level 4 Electronic Signature(s) Signed: 06/16/2019 6:00:33 PM By: Deon Pilling Entered By: Deon Pilling on 06/16/2019 16:04:52 -------------------------------------------------------------------------------- Encounter Discharge Information Details Patient Name: Date of Service: [name] 06/16/2019 10:00 AM Medical Record [id number] Patient Account Number: [id number] Date of Birth/Sex: Treating RN: [birth year] (69 y.o. [name] Primary Care Charlena Haub: [name] Other Clinician: Referring Gedalya Jim: Treating Lilya Smitherman/Extender:[name], [name], [name] in Treatment: 24 Encounter Discharge Information Items Discharge Condition: Stable Ambulatory Status: Wheelchair Discharge Destination: Home Transportation: Private Auto Accompanied  By: spouse Schedule Follow-up Appointment: Yes Clinical Summary of Care: Patient Declined Electronic Signature(s) Signed: 06/16/2019 5:58:47 PM By: Baruch Gouty RN, BSN Entered By: Baruch Gouty on 06/16/2019 11:10:37 -------------------------------------------------------------------------------- Lower Extremity Assessment Details Patient Name: Date of Service: [name], [name] 06/16/2019 10:00 AM Medical Record [id number] Patient Account Number: [id number] Date of Birth/Sex: Treating RN: 07-Jan-1951 (69 y.o. [name] Primary Care Sible Straley: [name] Other Clinician: Referring Mashonda Broski: Treating Cerise Lieber/Extender:[name], [name], [name] in Treatment: 24 Electronic Signature(s) Signed: 06/16/2019 6:03:12 PM By: Kela Millin Entered By: Kela Millin on 06/16/2019 10:25:31 -------------------------------------------------------------------------------- Multi Wound Chart Details Patient Name: Date of Service: [name], [name] 06/16/2019 10:00 AM Medical Record [id number] Patient Account Number: [id number] Date of Birth/Sex: Treating RN: 26-Jul-1950 (69 y.o. [name] Primary Care Wessley Emert: [name] Other Clinician: Referring Darrius Montano: Treating Alexina Niccoli/Extender:[name], [name], [name] in Treatment: 24 Vital Signs Height(in): 65 Pulse(bpm): 26 Weight(lbs): 185 Blood Pressure(mmHg): 152/70 Body Mass Index(BMI): 33 Temperature(F): 97.9 Respiratory 18 Rate(breaths/min): Photos: [12:No Photos] [N/A:N/A] Wound Location: [12:Left Ischial Tuberosity] [N/A:N/A] Wounding Event: [12:Pressure Injury] [N/A:N/A] Primary Etiology: [12:Pressure Ulcer] [N/A:N/A] Comorbid History: [12:Anemia, Hypertension, TypeN/A II Diabetes, Osteoarthritis, Dementia, Paraplegia, Received Radiation] Date Acquired: [12:09/06/2018] [N/A:N/A] Weeks of Treatment: [12:24] [N/A:N/A] Wound Status: [12:Open] [N/A:N/A] Measurements L x W x D 1.3x2.3x2.4  [N/A:N/A] (cm) Area (cm) : [12:2.348] [N/A:N/A] Volume (cm) : [12:5.636] [N/A:N/A] % Reduction in Area: [12:-894.90%] [N/A:N/A] % Reduction in Volume: -2558.50% [N/A:N/A] Classification: [12:Category/Stage IV] [N/A:N/A] Exudate Amount: [12:Medium] [N/A:N/A] Exudate Type: [12:Serosanguineous] [N/A:N/A] Exudate Color: [12:red, brown] [N/A:N/A] Wound Margin: [12:Well defined, not attached N/A] Granulation Amount: [12:Medium (34-66%)] [N/A:N/A] Granulation Quality: [12:Pink] [N/A:N/A] Necrotic Amount: [12:Medium (34-66%)] [N/A:N/A] Exposed Structures: [12:Fat Layer (Subcutaneous N/A Tissue) Exposed: Yes Fascia: No Tendon: No Muscle: No Joint: No Bone: No None] [N/A:N/A] Treatment Notes Wound #12 (Left Ischial Tuberosity) 2. Periwound Care Skin Prep 3. Primary Dressing Applied Collegen AG 4. Secondary  Dressing Other secondary dressing (specify in notes) Foam Border Dressing Notes saline moistened gauze packing gauze Electronic Signature(s) Signed: 06/16/2019 6:00:33 PM By: Deon Pilling Signed: 06/18/2019 7:03:15 AM By: Linton Ham MD Entered By: Linton Ham on 06/16/2019 11:26:24 -------------------------------------------------------------------------------- Multi-Disciplinary Care Plan Details Patient Name: Date of Service: [name], [name] 06/16/2019 10:00 AM Medical Record [id number] Patient Account Number: [id number] Date of Birth/Sex: Treating RN: 10-Nov-1950 (69 y.o. [name] Primary Care Oshea [name]: [name] Other Clinician: Referring Lanesha Azzaro: Treating Luvada Salamone/Extender:[name], [name], [name] in Treatment: 24 Active Inactive Abuse / Safety / Falls / Self Care Management Nursing Diagnoses: Impaired physical mobility Potential for falls Goals: Patient will remain injury free related to falls Date Initiated: 12/30/2018 Target Resolution Date: 07/08/2019 Goal Status: Active Patient/caregiver will verbalize/demonstrate measure taken to  improve self care Target Resolution Date Initiated: 12/30/2018 Date Inactivated: 01/13/2019 Date: 01/27/2019 Goal Status: Met Interventions: Assess fall risk on admission and as needed Provide education on fall prevention Notes: Electronic Signature(s) Signed: 06/16/2019 6:00:33 PM By: Deon Pilling Entered By: Deon Pilling on 06/16/2019 10:21:11 -------------------------------------------------------------------------------- Pain Assessment Details Patient Name: Date of Service: [name], [name] 06/16/2019 10:00 AM Medical Record [id number] Patient Account Number: [id number] Date of Birth/Sex: Treating RN: 08-07-1950 (69 y.o. [name] Primary Care Cedrick Partain: Other Clinician: Sandi [name] Referring Emiko Osorto: Treating Daylah Sayavong/Extender:[name], [name], [name] in Treatment: 24 Active Problems Location of Pain Severity and Description of Pain Patient Has Paino No Site Locations Pain Management and Medication Current Pain Management: Electronic Signature(s) Signed: 06/16/2019 6:03:12 PM By: Kela Millin Entered By: Kela Millin on 06/16/2019 10:25:25 -------------------------------------------------------------------------------- Patient/Caregiver Education Details Patient Name: Date of Service: [name] 3/11/2021andnbsp10:00 AM Medical Record [id number] Patient Account Number: [id number] Date of Birth/Gender: Treating RN: Apr 25, 1950 (69 y.o. [name] Primary Care Physician: [name] Other Clinician: Referring Physician: Treating Physician/Extender:[name], [name], [name] in Treatment: 24 Education Assessment Education Provided To: Patient Education Topics Provided Safety: Handouts: Safety Methods: Explain/Verbal Responses: Reinforcements needed Electronic Signature(s) Signed: 06/16/2019 6:00:33 PM By: Deon Pilling Entered By: Deon Pilling on 06/16/2019  10:21:36 -------------------------------------------------------------------------------- Wound Assessment Details Patient Name: Date of Service: [name], [name] 06/16/2019 10:00 AM Medical Record [id number] Patient Account Number: [id number] Date of Birth/Sex: Treating RN: 08/07/1950 (69 y.o. [name] Primary Care Johneric Mcfadden: [name] Other Clinician: Referring Matheson Vandehei: Treating Lathen Seal/Extender:[name], [name], [name] in Treatment: 24 Wound Status Wound Number: 12 Primary Pressure Ulcer Etiology: Wound Location: Left Ischial Tuberosity Wound Open Wounding Event: Pressure Injury Status: Date Acquired: 09/06/2018 Comorbid Anemia, Hypertension, Type II Diabetes, Weeks Of Treatment: 24 History: Osteoarthritis, Dementia, Paraplegia, Received Clustered Wound: No Radiation Wound Measurements Length: (cm) 1.3 % Reductio Width: (cm) 2.3 % Reductio Depth: (cm) 2.4 Epithelial Area: (cm) 2.348 Tunneling Volume: (cm) 5.636 Undermini Wound Description Classification: Category/Stage IV Wound Margin: Well defined, not attached Exudate Amount: Medium Exudate Type: Serosanguineous Exudate Color: red, brown Wound Bed Granulation Amount: Medium (34-66%) Granulation Quality: Pink Necrotic Amount: Medium (34-66%) Necrotic Quality: Adherent Slough Foul Odor After Cleansing: No Slough/Fibrino Yes Exposed Structure Fascia Exposed: No Fat Layer (Subcutaneous Tissue) Exposed: Yes Tendon Exposed: No Muscle Exposed: No Joint Exposed: No Bone Exposed: No n in Area: -894.9% n in Volume: -2558.5% ization: None : No ng: No Treatment Notes Wound #12 (Left Ischial Tuberosity) 2. Periwound Care Skin Prep 3. Primary Dressing Applied Collegen AG 4. Secondary Dressing Other secondary dressing (specify in notes) Foam Border Dressing Notes saline moistened gauze packing gauze Electronic Signature(s) Signed: 06/16/2019 6:03:12 PM By: Kela Millin Entered  By:  Kela Millin on 06/16/2019 10:24:28 -------------------------------------------------------------------------------- Vitals Details Patient Name: Date of Service: [name], [name] 06/16/2019 10:00 AM Medical Record [id number] Patient Account Number: [id number] Date of Birth/Sex: Treating RN: 03-18-1951 (69 y.o. [name] Primary Care Yomar Mejorado: [name] Other Clinician: Referring Danilynn Jemison: Treating Albana Saperstein/Extender:[name], [name], [name] in Treatment: 24 Vital Signs Time Taken: 10:20 Temperature (F): 97.9 Height (in): 63 Pulse (bpm): 80 Weight (lbs): 185 Respiratory Rate (breaths/min): 18 Body Mass Index (BMI): 32.8 Blood Pressure (mmHg): 152/70 Reference Range: 80 - 120 mg / dl Electronic Signature(s) Signed: 06/16/2019 6:03:12 PM By: Kela Millin Entered By: Kela Millin on 06/16/2019 10:25:20

## 2019-06-18 NOTE — Progress Notes (Signed)
[name], [name] ([id number]) Visit Report for 06/16/2019 HPI Details Patient Name: Date of Service: [name], [name] 06/16/2019 10:00 AM Medical Record Number:[id number] Patient Account Number: [id number] Date of Birth/Sex: Treating RN: 04-Aug-1950 (69 y.o. [name] Primary Care Provider: [name] Other Clinician: Referring Provider: Treating Provider/Extender:Steward Sames, [name], [name] in Treatment: 24 History of Present Illness Location: open ulceration of the left gluteal area, left heel and right ankle for about 5 months. Quality: Patient reports No Pain. Severity: Patient states wound(s) are getting worse. Duration: Patient has had the wound for > 5 months prior to seeking treatment at the wound center Context: The wound occurred when the patient has been paraplegic for about 3 years. Modifying Factors: Wound improving due to current treatment. Associated Signs and Symptoms: Patient reports having foul odor. HPI Description: this 69 year old patient who is known to have hypertension, hypothyroidism, breast cancer, chronic pain syndrome, paraplegia was noted to have a left gluteal decubitus ulcer and was brought into the hospital. During the course of her hospitalization she was debrided in the operating room by Dr. Leland Johns and had all the wounds sharply debrided. The debridement was done for the left ischial wound, the left heel wound and the right ankle wound. Bone cultures were taken at that time but were negative but clinically she was treated for osteomyelitis because of the probing down to bone and open exposed bone. Home health has been giving her antibioticss which include vancomycin and Zosyn. The patient was a smoker until about 3 weeks ago and used to smoke about 10 cigarettes a day for a long while. 12/13/2014 - details of her operative note from 11/03/2014 were reviewed -- PROCEDURE: 1. Excisional debridement skin, subcutaneous, muscle left ischium 35 cm2 2.  Excisional debridement skin, subcutaneous tissue left heel 27 cm2 3. Excisional debridement right ankle skin, subcutaneous, bone 30 cm2 01/24/2015 -- she has some issues with her wheelchair cushion but other than that is doing very well and has received Podus boots for her feet. 02/14/2015 -- she was using her old offloading boots and this seemed to have caused her a new pressure ulcer on the left posterior heel near the superior part just below the Achilles tendon. 03/07/2015 -- she has a new ulceration just to the left of the midline on her sacral region more on the left buttock and this has been there for about a week. 08/22/2015 -- was recently admitted to hospital between May 5 and 08/13/2015, with sepsis and leukocytosis due to a UTI. she was treated for a sepsis complicating Escherichia coli UTI and kidney stones. She also had metabolic and careful up at the secondary to pyelonephritis. He received broad-spectrum antibiotics initially and then received Macrobid as per urology. She was sent home on nitrofurantoin. during her admission she had a CT scan which showed exposed left ischial tuberosity without evidence of osteolysis. 09/12/2015-- the patient is having some issues with her air mattress and would like to get a opinion from medical modalities. 10/10/2015 -- the issue with her air mattress has not yet been sorted out and the new problem seems to be a lot of odor from the wound VAC. 11/27/2015 -- the patient was admitted to the hospital between July 23 and 10/31/2015. Her problems were sepsis, osteomyelitis of the pelvic bone and acute pyelonephritis. CT of the abdomen and pelvis was consistent with a left- sided pyelonephritis with hydronephrosis and also just showed new sclerosis of the posterior portion of the left anterior pubic ramus suggestive of  periosteal reaction consistent with osteomyelitis. She was treated for the osteomyelitis with infectious disease consult recommending  6 weeks of IV antibiotics including vancomycin and Rocephin and the antibiotics were to go on until 12/10/2015. He was seen by Dr. Iran Planas plastic surgery and Dr. Linus Salmons of infectious disease. She had a suprapubic catheter placed during the admission. CT scan done on 10/28/2015 showed specifically -- New sclerosis of the posterior portion of the left inferior pubic ramus with aggressive periosteal reaction, consistent with osteomyelitis, with adjacent soft tissue gas compatible with previously described decubitus ulcer. 12/12/2015 -- she was recently seen by Dr. Linus Salmons, who noted good improvement and CRP and ESR compared to before and he has stopped her antibiotic as per plans to finish on September 4. The patient was encouraged to continue with wound care and consider hyperbaric oxygen therapy. Today she tells me that she has consented to undergo hyperbaric oxygen therapy and we can start the paperwork. 01/02/2016 -- her PCP had gained about 3 years but she still persists in having problems during hyperbaric oxygen therapy with some discomfort in the ears. 01/09/16; pressure area with underlying osteomyelitis in the left buttock. Wound bed itself has some slight amount of grayish surface slough however I do not think any debridement was necessary. There is no exposed bone soft tissue appears stable. She is using a wound VAC 01/16/16; back for weekly wound review in conjunction with HBO. She has a deep wound over the left initial tuberosity previously treated with 6 weeks of IV antibiotics for osteomyelitis. Wound bed looks reasonably healthy although the base of this is still precariously close to bone. She has been using a wound VAC. 01/23/2016 -- she has completed her course of antibiotics and this week the only new thing is her right great toe nail was avulsed and she has got an open wound over the nailbed. 01/31/16 she has completed her course of antibiotics. Her right great toenail avulsed  last week and she's been using silver alginate for this as well. Still using a wound VAC to the substantial stage IV wound over the left ischial tuberosity 03/05/2016 -- the patient has had a opinion from the plastic surgery group at [facility] and details of this are not available yet but the patient's verbal report has been heard by me. Did not sound like there was any optimistic discussion regarding reconstruction and the net result would be to continue with the wound VAC application. I will await the official reports. Addendum: -- she was seen at [place] surgery service by Dr. Tressa Busman. After a thorough review and from what I understand spending 45 minutes with the patient his assessment has been noted by me in detail and the management options were: 1. Continued pressure offloading and wound care versus operative procedures including wound excision 2. Soft tissue and bone sampling 3. If the wound gets larger wound closure would be done using a variety of plastic surgical techniques including but not limited to skin substitute, possible skin graft, local versus regional flaps, negative pressure dressing application. 4. He discussed with her details of flap surgery and the risks associated 5. He made a comment that since the patient was operated on by Dr. Leland Johns of [facility] plastic surgery unit in [place] the patient may continue to follow-up there for further evaluation for surgical flap closure in the future. 03/19/2016 -- the patient continues to be rather depressed and frustrated with her lack of rapid progress in  healing this wound especially because she thought after hyperbaric oxygen therapy the wound would heal extremely fast. She now understands that was not the implied benefit on wound care which was the recommendation for hyperbaric oxygen therapy. I have had a lengthy discussion with the patient and her husband regarding her  options: 1. Continue with collagen and wound VAC for the primary dressing and offloading and all supportive care. 2. See Dr. Iran Planas for possible placement of Acell or Integra in the OR. 3. get a second opinion from a wound care center and surrounding regions/counties 05/07/2016 -- Note from Dr. Celedonio Miyamoto, who noted that the patient has declined flap surgery. She has discussed application of A cell, and try a few applications to see how the wound progresses. She is also recommended that we could apply products here in the wound center, like Oasis. during her preop workup it was found that her hemoglobin A1c was 11% and she has now been diagnosed as having diabetes mellitus and has been put on appropriate treatment by her PCP 05/28/2016 -- tells me her blood sugars have been doing well and she has an appointment to see her PCP in the next couple of weeks to check her hemoglobin A1c. Other than that she continues to do well. 06/25/2016 -- have not seen her back for the last month but she says her health has been about the same and she has an appointment to check the A1c next week 09/10/16 ---- was seen by Dr. Celedonio Miyamoto -- who applied Acell and saw her back in follow-up. She has recommended silver alginate to the wound every other day and cover with foam. If no significant drainage could transition to collagen every other day. She recommended discontinuing wound VAC. There were no plans to repeat application of Acell. The patient expressed that her husband could do the wound care as going to the Wound Ctr., would cost several $100 for each visit. 10/21/2016 -- her insurance company is getting her new mattress and she is pleased about that. Other than that she has been doing dressings with PolyMem Silver and has been doing very well 02/18/2017 -- she has gone through several changes of her mattress and has not been pleased with any of them. The ventricles are still working on trying to  fit her with the appropriate low air-loss mattress. She has a new wound on the gluteal area which is clearly separated from the original wound. 03/25/17-she is here in follow-up evaluation for her left ischialpressure ulcer. She remains unsatisfied with her pressure mattress. She admits to sitting multiple hours a day, in the bed. We have discussed offloading options. The wound does not appear infected. Nutrition does not appear to be a concern. Will follow-up in 4 weeks, if wound continues to be stalled may consider x-ray to evaluate for refractory osteomyelitis. 04/21/17; this is a patient that I don't know all that well. She has a chronic wound which at one point had underlying osteomyelitis in the left ischial tuberosity. This is a stage IV pressure ulcer. Over the last 3 months she has a stage II wound inferiorly to the original wound. The last time she was here her dressing was changed to silver collagen although the patient's husband who changes the dressing said that the collagen stuck to the wound and remove skin from the superficial area therefore he switched back to [place] 05/13/17; this is a patient we've been following for a left ischial tuberosity wound which was stage IV  at one point had underlying osteomyelitis. Over the last several months she's had a stage II wound just inferior and medial to the related to the wound. According to her husband he is using Endoform layer with collagen although this is not what I had last time. According to her husband they are using Elgie [place] with collagen although I don't quite know how that started. She was hospitalized from 1/20 through 04/30/16. This was related to a UTI. Her blood cultures were negative, urine culture showed multiple species. She did have a CT scan of the abdomen and pelvis which documented chronic osteomyelitis in the area of the wound inflammatory markers were unremarkable. She has had prior knowledge of osteomyelitis.  It looks as though she received IV antibiotics in 2017 and was treated with a course of hyperbaric oxygen. 05/28/17; the wound over the left ischial tuberosity is deeper today and abuts clearly on bone. Nursing intake reported drainage. I therefore culture of the wound. The more superficial area just below this looks about the same. They once again complained that there are mattress cover is not working although apparently [facility] is been noted to see this many times in the report is that the device is functional 06/18/17; the patient had a probing area on the left ischial tuberosity that was draining purulent fluid last time. This also clearly seemed to have open bone. Culture I did showed pansensitive pseudomonas including third generation cephalosporins. I treated this with cefdinir 300 twice a day for 10 days and things seem to have improved. She has a more superficial wound just underneath this area. Amazingly she has a new air mattress through [facility]. I think they gave this to her as a parking give. In any case this now works according to the patient may have something to do with why the areas are looking better. 07/09/17; the patient has a probing area in the left ischial tuberosity that still has some depth. However this is contracted in terms of the wound orifice although the depth is still roughly the same. There is no undermining. She also has the satellite wound which is more superficial. This appears to have a healthy surface we've been using silver collagen 08/06/17; the patient's wound is over the left ischial tuberosity and a satellite lesion just underneath this. The original wound was actually a deep stage 4 wound. We have made good progress in 2 months and there is no longer exposed bone here. 09/03/17; left ischial tuberosity actually appears to be quite healthy. I think we are making progress. No debridement is required. There is no surrounding  erythema 10/01/17 I follow this patient monthly for her left ischial tuberosity wound. There is 2 areas the original area and a satellite area. The satellite area looks a lot better there is no surrounding erythema. Her husband relates that he is having trouble maintaining the dressing. This has to do with the soft tissue around it. He states he puts the collagen in but he cannot make sure that it stays in even with the ABD pads and tape that he is been using 10/29/17; patient arrives with a better looking noon today. Some of the satellite lesions have closed. using Prisma 11/26/17; the patient has a large cone-shaped area with the tip of the Cone deep within her buttock soft tissue. The walls of the Cone are epithelialized however the base is still open. The area at the base of this looks moist we've been using silver collagen. Will  change to silver alginate 12/31/2017; the wound appears to have come in fairly nicely. Using silver alginate. There is no surrounding maceration or infection 01/28/18; there is still an open area here over the left initial tuberosity. Base of this however looks healthy. There is no surrounding infection 02/25/18; the area of its open is over the left ischial tuberosity. The base of this is where the wound is. This is a large inverted cone-shaped area with the wound at the tip. Dimensions of the wound at the tip are improved. There is a area of denuded skin about halfway towards the tip which her husband thinks may have happened today when he was bathing her. 04/20/17; the area is still open over the left initial tuberosity. 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 This is now completely closed over although it is closed over with a divot and skin over bone at the base of this.  Continued aggressive offloading will be necessary. 12/30/2018 READMISSION This is a 69 year old woman with chronic paraplegia. I picked her up for her care from Dr. Con Memos in this clinic after he departed. She had a stage IV pressure wound over the left ischial tuberosity. She was treated twice for her underlying osteomyelitis and this I believe firstly in 2016 and again in 2017. There were some plans at some point for flap closure of this however she was discovered to have uncontrolled diabetes and I do not think this was ever accomplished. She ultimately healed over in this clinic and was discharged in May. She has a large cone-shaped indentation with the tip of this going towards the left ischial tuberosity. It is not an easy area to examine but at that time I thought all of this was epithelialized. Apparently there was a reopening here shortly after she left the clinic last time. She was admitted to hospital at the end of June for Klebsiella bacteremia felt to be secondary to UTI. A CT scan of the pelvis is listed below and there was initially some concern that she had underlying osteomyelitis although I believe she was seen by infectious disease and that was felt to be not the case: I do not see any new cultures or inflammatory markers IMPRESSION: 1. No CT evidence for acute intra-abdominal or pelvic abnormality. Large volume of stool throughout the colon. 2. Enlarged fatty liver with fat sparing near the gallbladder fossa 3. Cortical scarring right kidney. Bilateral intrarenal stones without hydronephrosis. Thick-walled urinary bladder decompressed by suprapubic catheter 4. Deep left decubitus ulcer with underlying left ischial changes suggesting osteomyelitis. Her husband has been using silver collagen in the wound. She has not been systemically unwell no fever chills eating and drinking well. They rigorously offload this wound only getting up in the wheelchair when she is going  to appointments the rest of the time she is in bed. 10/8; wound measures larger and she now has exposed bone. We have been using silver alginate 11/12 still using silver alginate. The patient saw Dr. Megan Salon of infectious disease. She was started on Augmentin 500 mg twice daily. She is due to follow-up with Dr. Megan Salon I believe next week. Lab work Dr. Megan Salon requested showed a sedimentation rate of 28 and CRP of 20 although her CRP 1 year ago was 18.8. Sedimentation rate 1 year ago  was 11 basic metabolic panel showed a creatinine of 1.12 12/3; the patient followed up with Dr. Megan Salon yesterday. She is still on Augmentin twice daily. This was directed by Dr. Megan Salon. The patient's inflammatory markers have improved which is gratifying. Her C-reactive protein was repeated yesterday and follow-up booked with infectious disease in January. In addition I have been getting secure text messages I think from palliative care through [facility] [employer]. I think they were hoping to provide services to the patient in her home. They could not get a hold of the primary physician and so they reached out to me on 2 separate occasions. 12/17; patient last saw Dr. Megan Salon on 12/2. She is finishing up with Augmentin. Her C-reactive protein was 20 on 10/21, 10.1 on 11/19 and 17 on 12/2. The wound itself still has depth and undermining. We are using Santyl with the backing wet-to-dry 04/27/2019. The wound is gradually clearing up in terms of the surface although it is not filled in that much. Still abuts right against bone 2/4; patient with a deep pressure ulcer over the left ischial tuberosity. I thought she was going to follow-up with infectious disease to follow her inflammatory markers although the patient states that they stated that they did not need to see her unless we felt it was necessary. I will need to check their notes. In any case we ordered moistened silver collagen back  with wet-to-dry to fill in the depth of the wound although apparently [facility] sent silver alginate which they have been using since they were here the last time. Is obviously not what we ordered. 2/25. Not much change in this wound it is over the left ischial tuberosity recurrent wound. We have been using silver collagen with backing wet-to-dry. I think the wound is about the same. There is still some tunneling from about 10- 12 o'clock over the ischial tuberosity itself 3/11; pressure ulcer over the left ischial tuberosity. Since she was last here the wound VAC was started and apparently going quite well. We are able to get the home health company that accepts [place] healthcare which is in itself sometimes problematic. There is been improvements in the wound the tunneling seems to be better and is contracted nicely Electronic Signature(s) Signed: 06/18/2019 7:03:15 AM By: Linton Ham MD Entered By: Linton Ham on 06/16/2019 11:28:03 -------------------------------------------------------------------------------- Physical Exam Details Patient Name: Date of Service: [name], [name] 06/16/2019 10:00 AM Medical Record [id number] Patient Account Number: [id number] Date of Birth/Sex: Treating RN: 01-Nov-1950 (69 y.o. [name] Primary Care Provider: [name] Other Clinician: Referring Provider: Treating Provider/Extender:Teena Mangus, [name], [name] in Treatment: 24 Notes Wound exam; left ischial tuberosity. I think this is improved quite a bit. The deep tunnel from 10-12 o'clock is come in. There is no exposed bone. No surrounding infection. What I can see of the wound surface looks reasonably healthy there is some slough but I didn't think any debridement was necessary. There is no surrounding erythema and no surrounding tenderness or crepitus Electronic Signature(s) Signed: 06/18/2019 7:03:15 AM By: Linton Ham MD Entered By: Linton Ham on 06/16/2019  11:28:55 -------------------------------------------------------------------------------- Physician Orders Details Patient Name: Date of Service: [name], [name] 06/16/2019 10:00 AM Medical Record [id number] Patient Account Number: [id number] Date of Birth/Sex: Treating RN: 1951-02-09 (69 y.o. [name] Primary Care Provider: [name] Other Clinician: Referring Provider: Treating Provider/Extender:Newell Frater, [name], [name] in Treatment: 24 Verbal / Phone Orders: No Diagnosis Coding ICD-10 Coding Code Description L89.324 Pressure ulcer  of left buttock, stage 4 G82.20 Paraplegia, unspecified E11.622 Type 2 diabetes mellitus with other skin ulcer M86.68 Other chronic osteomyelitis, other site Follow-up Appointments Return Appointment in 2 weeks. - Thursday Dressing Change Frequency Change dressing three times week. - wound vac Skin Barriers/Peri-Wound Care Skin Prep - to periwound. Wound Cleansing Wound #12 Left Ischial Tuberosity May shower and wash wound with soap and water. - or wound cleanser Primary Wound Dressing Wound #12 Left Ischial Tuberosity Collagen - apply collagen under wound vac. collagen with wet to dry in clinic only. wound vac to be reapplied by home health on Friday. Secondary Dressing Wound #12 Left Ischial Tuberosity Foam Border - in clinic. Negative Presssure Wound Therapy Wound #12 Left Ischial Tuberosity Wound Vac to wound continuously at 162m/hg pressure Black Foam Off-Loading Low air-loss mattress (Group 2) - continue to use Gel wheelchair cushion - continue to use specialty wheelchair cushion. Turn and reposition every [facility] - [facility] to admit patient and change wound vac dressing three times a week. Electronic Signature(s) Signed: 06/16/2019 6:00:33 PM By: DDeon PillingSigned: 06/18/2019 7:03:15 AM By: RLinton HamMD Entered By: DDeon Pillingon 06/16/2019 10:53:51 -------------------------------------------------------------------------------- Problem List Details Patient Name: Date of Service: CMAIRANY, BRUNO3/02/2020 10:00 AM Medical Record [id number] Account Number: [id number] of Birth/Sex: Treating RN: 521-May-1952(69y.o. FHelene [name] BTammi KlippelPrimary Care Provider: Other Clinician: SSandi MariscalReferring Provider: Treating Provider/Extender:Zackeriah Kissler, MStark Klein YKurtis Bushmanin Treatment: 24 Active Problems ICD-10 Evaluated Encounter Code Description Active Date Today Diagnosis 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, stage 4 12/30/2018 No Yes G82.20 Paraplegia, unspecified 12/30/2018 No Yes E11.622 Type 2 diabetes mellitus with other skin ulcer 12/30/2018 No Yes M86.68 Other chronic osteomyelitis, other site 02/17/2019 No Yes Inactive Problems Resolved Problems Electronic Signature(s) Signed: 06/18/2019 7:03:15 AM By: RLinton HamMD Entered By: RLinton Hamon 06/16/2019 11:26:17 -------------------------------------------------------------------------------- Progress Note Details Patient Name: Date of Service: [name] Loa3/02/2020 10:00 AM Medical Record [id number] Account Number: [id number] of Birth/Sex: Treating RN: 510/07/1950(69y.o. FHelene [name] BTammi KlippelPrimary Care Provider: SSandi MariscalOther Clinician: Referring Provider: Treating Provider/Extender:Emmaclaire Switala, MStark Klein YKurtis Bushmanin Treatment: 24 Subjective History of Present Illness (HPI) The following HPI elements were documented for the patient's wound: Location: open ulceration of the left gluteal area, left heel and right ankle for about 5 months. Quality: Patient reports No Pain. Severity: Patient states wound(s) are getting worse. Duration: Patient has had the wound for > 5 months prior to seeking treatment at the wound center Context: The wound occurred when the patient has been paraplegic for about 3  years. Modifying Factors: Wound improving due to current treatment. Associated Signs and Symptoms: Patient reports having foul odor. this 69year old patient who is known to have hypertension, hypothyroidism, breast cancer, chronic pain syndrome, paraplegia was noted to have a left gluteal decubitus ulcer and was brought into the hospital. During the course of her hospitalization she was debrided in the operating room by Dr. TLeland Johnsand had all the wounds sharply debrided. The debridement was done for the left ischial wound, the left heel wound and the right ankle wound. Bone cultures were taken at that time but were negative but clinically she was treated for osteomyelitis because of the probing down to bone and open exposed bone. Home health has been giving her antibioticss which include vancomycin and Zosyn. The patient was a smoker until about 3 weeks ago and  used to smoke about 10 cigarettes a day for a long while. 12/13/2014 - details of her operative note from 11/03/2014 were reviewed -- PROCEDURE: 1. Excisional debridement skin, subcutaneous, muscle left ischium 35 cm2 2. Excisional debridement skin, subcutaneous tissue left heel 27 cm2 3. Excisional debridement right ankle skin, subcutaneous, bone 30 cm2 01/24/2015 -- she has some issues with her wheelchair cushion but other than that is doing very well and has received Podus boots for her feet. 02/14/2015 -- she was using her old offloading boots and this seemed to have caused her a new pressure ulcer on the left posterior heel near the superior part just below the Achilles tendon. 03/07/2015 -- she has a new ulceration just to the left of the midline on her sacral region more on the left buttock and this has been there for about a week. 08/22/2015 -- was recently admitted to hospital between May 5 and 08/13/2015, with sepsis and leukocytosis due to a UTI. she was treated for a sepsis complicating Escherichia coli UTI and kidney  stones. She also had metabolic and careful up at the secondary to pyelonephritis. He received broad-spectrum antibiotics initially and then received Macrobid as per urology. She was sent home on nitrofurantoin. during her admission she had a CT scan which showed exposed left ischial tuberosity without evidence of osteolysis. 09/12/2015-- the patient is having some issues with her air mattress and would like to get a opinion from medical modalities. 10/10/2015 -- the issue with her air mattress has not yet been sorted out and the new problem seems to be a lot of odor from the wound VAC. 11/27/2015 -- the patient was admitted to the hospital between July 23 and 10/31/2015. Her problems were sepsis, osteomyelitis of the pelvic bone and acute pyelonephritis. CT of the abdomen and pelvis was consistent with a left- sided pyelonephritis with hydronephrosis and also just showed new sclerosis of the posterior portion of the left anterior pubic ramus suggestive of periosteal reaction consistent with osteomyelitis. She was treated for the osteomyelitis with infectious disease consult recommending 6 weeks of IV antibiotics including vancomycin and Rocephin and the antibiotics were to go on until 12/10/2015. He was seen by Dr. Iran Planas plastic surgery and Dr. Linus Salmons of infectious disease. She had a suprapubic catheter placed during the admission. CT scan done on 10/28/2015 showed specifically -- New sclerosis of the posterior portion of the left inferior pubic ramus with aggressive periosteal reaction, consistent with osteomyelitis, with adjacent soft tissue gas compatible with previously described decubitus ulcer. 12/12/2015 -- she was recently seen by Dr. Linus Salmons, who noted good improvement and CRP and ESR compared to before and he has stopped her antibiotic as per plans to finish on September 4. The patient was encouraged to continue with wound care and consider hyperbaric oxygen therapy. Today she tells  me that she has consented to undergo hyperbaric oxygen therapy and we can start the paperwork. 01/02/2016 -- her PCP had gained about 3 years but she still persists in having problems during hyperbaric oxygen therapy with some discomfort in the ears. 01/09/16; pressure area with underlying osteomyelitis in the left buttock. Wound bed itself has some slight amount of grayish surface slough however I do not think any debridement was necessary. There is no exposed bone soft tissue appears stable. She is using a wound VAC 01/16/16; back for weekly wound review in conjunction with HBO. She has a deep wound over the left initial tuberosity previously treated with 6 weeks of IV antibiotics  for osteomyelitis. Wound bed looks reasonably healthy although the base of this is still precariously close to bone. She has been using a wound VAC. 01/23/2016 -- she has completed her course of antibiotics and this week the only new thing is her right great toe nail was avulsed and she has got an open wound over the nailbed. 01/31/16 she has completed her course of antibiotics. Her right great toenail avulsed last week and she's been using silver alginate for this as well. Still using a wound VAC to the substantial stage IV wound over the left ischial tuberosity 03/05/2016 -- the patient has had a opinion from the plastic surgery group at [facility] and details of this are not available yet but the patient's verbal report has been heard by me. Did not sound like there was any optimistic discussion regarding reconstruction and the net result would be to continue with the wound VAC application. I will await the official reports. Addendum: -- she was seen at [place] surgery service by Dr. Tressa Busman. After a thorough review and from what I understand spending 45 minutes with the patient his assessment has been noted by me in detail and the management options were: 1. Continued pressure  offloading and wound care versus operative procedures including wound excision 2. Soft tissue and bone sampling 3. If the wound gets larger wound closure would be done using a variety of plastic surgical techniques including but not limited to skin substitute, possible skin graft, local versus regional flaps, negative pressure dressing application. 4. He discussed with her details of flap surgery and the risks associated 5. He made a comment that since the patient was operated on by Dr. Leland Johns of [facility] plastic surgery unit in [place] the patient may continue to follow-up there for further evaluation for surgical flap closure in the future. 03/19/2016 -- the patient continues to be rather depressed and frustrated with her lack of rapid progress in healing this wound especially because she thought after hyperbaric oxygen therapy the wound would heal extremely fast. She now understands that was not the implied benefit on wound care which was the recommendation for hyperbaric oxygen therapy. I have had a lengthy discussion with the patient and her husband regarding her options: 1. Continue with collagen and wound VAC for the primary dressing and offloading and all supportive care. 2. See Dr. Iran Planas for possible placement of Acell or Integra in the OR. 3. get a second opinion from a wound care center and surrounding regions/counties 05/07/2016 -- Note from Dr. Celedonio Miyamoto, who noted that the patient has declined flap surgery. She has discussed application of A cell, and try a few applications to see how the wound progresses. She is also recommended that we could apply products here in the wound center, like Oasis. during her preop workup it was found that her hemoglobin A1c was 11% and she has now been diagnosed as having diabetes mellitus and has been put on appropriate treatment by her PCP 05/28/2016 -- tells me her blood sugars have been doing well and she has an  appointment to see her PCP in the next couple of weeks to check her hemoglobin A1c. Other than that she continues to do well. 06/25/2016 -- have not seen her back for the last month but she says her health has been about the same and she has an appointment to check the A1c next week 09/10/16 ---- was seen by Dr. Celedonio Miyamoto -- who applied  Acell and saw her back in follow-up. She has recommended silver alginate to the wound every other day and cover with foam. If no significant drainage could transition to collagen every other day. She recommended discontinuing wound VAC. There were no plans to repeat application of Acell. The patient expressed that her husband could do the wound care as going to the Wound Ctr., would cost several $100 for each visit. 10/21/2016 -- her insurance company is getting her new mattress and she is pleased about that. Other than that she has been doing dressings with PolyMem Silver and has been doing very well 02/18/2017 -- she has gone through several changes of her mattress and has not been pleased with any of them. The ventricles are still working on trying to fit her with the appropriate low air-loss mattress. She has a new wound on the gluteal area which is clearly separated from the original wound. 03/25/17-she is here in follow-up evaluation for her left ischialpressure ulcer. She remains unsatisfied with her pressure mattress. She admits to sitting multiple hours a day, in the bed. We have discussed offloading options. The wound does not appear infected. Nutrition does not appear to be a concern. Will follow-up in 4 weeks, if wound continues to be stalled may consider x-ray to evaluate for refractory osteomyelitis. 04/21/17; this is a patient that I don't know all that well. She has a chronic wound which at one point had underlying osteomyelitis in the left ischial tuberosity. This is a stage IV pressure ulcer. Over the last 3 months she has a stage II wound  inferiorly to the original wound. The last time she was here her dressing was changed to silver collagen although the patient's husband who changes the dressing said that the collagen stuck to the wound and remove skin from the superficial area therefore he switched back to [place] 05/13/17; this is a patient we've been following for a left ischial tuberosity wound which was stage IV at one point had underlying osteomyelitis. Over the last several months she's had a stage II wound just inferior and medial to the related to the wound. According to her husband he is using Endoform layer with collagen although this is not what I had last time. According to her husband they are using Elgie Congo with collagen although I don't quite know how that started. She was hospitalized from 1/20 through 04/30/16. This was related to a UTI. Her blood cultures were negative, urine culture showed multiple species. She did have a CT scan of the abdomen and pelvis which documented chronic osteomyelitis in the area of the wound inflammatory markers were unremarkable. She has had prior knowledge of osteomyelitis. It looks as though she received IV antibiotics in 2017 and was treated with a course of hyperbaric oxygen. 05/28/17; the wound over the left ischial tuberosity is deeper today and abuts clearly on bone. Nursing intake reported drainage. I therefore culture of the wound. The more superficial area just below this looks about the same. They once again complained that there are mattress cover is not working although apparently [facility] is been noted to see this many times in the report is that the device is functional 06/18/17; the patient had a probing area on the left ischial tuberosity that was draining purulent fluid last time. This also clearly seemed to have open bone. Culture I did showed pansensitive pseudomonas including third generation cephalosporins. I treated this with cefdinir 300 twice a  day for 10 days and things  seem to have improved. She has a more superficial wound just underneath this area. Amazingly she has a new air mattress through [facility]. I think they gave this to her as a parking give. In any case this now works according to the patient may have something to do with why the areas are looking better. 07/09/17; the patient has a probing area in the left ischial tuberosity that still has some depth. However this is contracted in terms of the wound orifice although the depth is still roughly the same. There is no undermining. ooShe also has the satellite wound which is more superficial. This appears to have a healthy surface we've been using silver collagen 08/06/17; the patient's wound is over the left ischial tuberosity and a satellite lesion just underneath this. The original wound was actually a deep stage 4 wound. We have made good progress in 2 months and there is no longer exposed bone here. 09/03/17; left ischial tuberosity actually appears to be quite healthy. I think we are making progress. No debridement is required. There is no surrounding erythema 10/01/17 I follow this patient monthly for her left ischial tuberosity wound. There is 2 areas the original area and a satellite area. The satellite area looks a lot better there is no surrounding erythema. Her husband relates that he is having trouble maintaining the dressing. This has to do with the soft tissue around it. He states he puts the collagen in but he cannot make sure that it stays in even with the ABD pads and tape that he is been using 10/29/17; patient arrives with a better looking noon today. Some of the satellite lesions have closed. using Prisma 11/26/17; the patient has a large cone-shaped area with the tip of the Cone deep within her buttock soft tissue. The walls of the Cone are epithelialized however the base is still open. The area at the base of this looks moist we've been using silver  collagen. Will change to silver alginate 12/31/2017; the wound appears to have come in fairly nicely. Using silver alginate. There is no surrounding maceration or infection 01/28/18; there is still an open area here over the left initial tuberosity. Base of this however looks healthy. There is no surrounding infection 02/25/18; the area of its open is over the left ischial tuberosity. The base of this is where the wound is. This is a large inverted cone-shaped area with the wound at the tip. Dimensions of the wound at the tip are improved. There is a area of denuded skin about halfway towards the tip which her husband thinks may have happened today when he was bathing her. 04/20/17; the area is still open over the left initial tuberosity. 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 This is now completely closed over although it is closed over with a divot and skin over bone at the base of this. Continued aggressive offloading will be necessary. 12/30/2018 READMISSION This is a 69 year old woman with chronic paraplegia. I picked her up for her care from Dr. Con Memos in this clinic after he departed. She had a stage IV pressure wound over the left ischial tuberosity. She was treated twice for her underlying osteomyelitis and this I believe firstly in 2016 and again in 2017. There were some plans at some point  for flap closure of this however she was discovered to have uncontrolled diabetes and I do not think this was ever accomplished. She ultimately healed over in this clinic and was discharged in May. She has a large cone-shaped indentation with the tip of this going towards the left ischial tuberosity. It is not an easy area to examine but at that time I thought all of this was epithelialized. Apparently  there was a reopening here shortly after she left the clinic last time. She was admitted to hospital at the end of June for Klebsiella bacteremia felt to be secondary to UTI. A CT scan of the pelvis is listed below and there was initially some concern that she had underlying osteomyelitis although I believe she was seen by infectious disease and that was felt to be not the case: I do not see any new cultures or inflammatory markers IMPRESSION: 1. No CT evidence for acute intra-abdominal or pelvic abnormality. Large volume of stool throughout the colon. 2. Enlarged fatty liver with fat sparing near the gallbladder fossa 3. Cortical scarring right kidney. Bilateral intrarenal stones without hydronephrosis. Thick-walled urinary bladder decompressed by suprapubic catheter 4. Deep left decubitus ulcer with underlying left ischial changes suggesting osteomyelitis. Her husband has been using silver collagen in the wound. She has not been systemically unwell no fever chills eating and drinking well. They rigorously offload this wound only getting up in the wheelchair when she is going to appointments the rest of the time she is in bed. 10/8; wound measures larger and she now has exposed bone. We have been using silver alginate 11/12 still using silver alginate. The patient saw Dr. Megan Salon of infectious disease. She was started on Augmentin 500 mg twice daily. She is due to follow-up with Dr. Megan Salon I believe next week. Lab work Dr. Megan Salon requested showed a sedimentation rate of 28 and CRP of 20 although her CRP 1 year ago was 18.8. Sedimentation rate 1 year ago was 11 basic metabolic panel showed a creatinine of 1.12 12/3; the patient followed up with Dr. Megan Salon yesterday. She is still on Augmentin twice daily. This was directed by Dr. Megan Salon. The patient's inflammatory markers have improved which is gratifying. Her C-reactive protein was repeated yesterday and follow-up booked with  infectious disease in January. In addition I have been getting secure text messages I think from palliative care through [facility] [employer]. I think they were hoping to provide services to the patient in her home. They could not get a hold of the primary physician and so they reached out to me on 2 separate occasions. 12/17; patient last saw Dr. Megan Salon on 12/2. She is finishing up with Augmentin. Her C-reactive protein was 20 on 10/21, 10.1 on 11/19 and 17 on 12/2. The wound itself still has depth and undermining. We are using Santyl with the backing wet-to-dry 04/27/2019. The wound is gradually clearing up in terms of the surface although it is not filled in that much. Still abuts right against bone 2/4; patient with a deep pressure ulcer over the left ischial tuberosity. I thought she was going to follow-up with infectious disease to follow her inflammatory markers although the patient states that they stated that they did not need to see her unless we felt it was necessary. I will need to check their notes. In any case we ordered moistened silver collagen back with wet-to-dry to fill in the depth of the wound although apparently [facility] sent silver alginate which they  have been using since they were here the last time. Is obviously not what we ordered. 2/25. Not much change in this wound it is over the left ischial tuberosity recurrent wound. We have been using silver collagen with backing wet-to-dry. I think the wound is about the same. There is still some tunneling from about 10- 12 o'clock over the ischial tuberosity itself 3/11; pressure ulcer over the left ischial tuberosity. Since she was last here the wound VAC was started and apparently going quite well. We are able to get the home health company that accepts [place] healthcare which is in itself sometimes problematic. There is been improvements in the wound the tunneling seems to be better and is contracted  nicely Objective Constitutional Vitals Time Taken: 10:20 AM, Height: 63 in, Weight: 185 lbs, BMI: 32.8, Temperature: 97.9 F, Pulse: 80 bpm, Respiratory Rate: 18 breaths/min, Blood Pressure: 152/70 mmHg. Integumentary (Hair, Skin) Wound #12 status is Open. Original cause of wound was Pressure Injury. The wound is located on the Left Ischial Tuberosity. The wound measures 1.3cm length x 2.3cm width x 2.4cm depth; 2.348cm^2 area and 5.636cm^3 volume. There is Fat Layer (Subcutaneous Tissue) Exposed exposed. There is no tunneling or undermining noted. There is a medium amount of serosanguineous drainage noted. The wound margin is well defined and not attached to the wound base. There is medium (34-66%) pink granulation within the wound bed. There is a medium (34-66%) amount of necrotic tissue within the wound bed including Adherent Slough. Assessment Active Problems ICD-10 Pressure ulcer of left buttock, stage 4 Paraplegia, unspecified Type 2 diabetes mellitus with other skin ulcer Other chronic osteomyelitis, other site Plan Follow-up Appointments: Return Appointment in 2 weeks. - Thursday Dressing Change Frequency: Change dressing three times week. - wound vac Skin Barriers/Peri-Wound Care: Skin Prep - to periwound. Wound Cleansing: Wound #12 Left Ischial Tuberosity: May shower and wash wound with soap and water. - or wound cleanser Primary Wound Dressing: Wound #12 Left Ischial Tuberosity: Collagen - apply collagen under wound vac. collagen with wet to dry in clinic only. wound vac to be reapplied by home health on Friday. Secondary Dressing: Wound #12 Left Ischial Tuberosity: Foam Border - in clinic. Negative Presssure Wound Therapy: Wound #12 Left Ischial Tuberosity: Wound Vac to wound continuously at 131m/hg pressure Black Foam Off-Loading: Low air-loss mattress (Group 2) - continue to use Gel wheelchair cushion - continue to use specialty wheelchair cushion. Turn and  reposition every 2 hours [facility] - [facility] to admit patient and change wound vac dressing three times a week. 1. I have added plain collagen under the VAC foam. This can be changed by home health 2. I think there is been some real improvement here. I have emphasized offloading. 3. No evidence of current infection Electronic Signature(s) Signed: 06/18/2019 7:03:15 AM By: RLinton HamMD Entered By: RLinton Hamon 06/16/2019 11:29:44 -------------------------------------------------------------------------------- SuperBill Details Patient Name: Date of Service: [name], SEFCIK3/02/2020 Medical Record Number:[id number] Patient Account Number: [id number] of Birth/Sex: Treating RN: 510/31/1952(69y.o. FHelene [name] BTammi KlippelPrimary Care Provider: SSandi MariscalOther Clinician: Referring Provider: Treating Provider/Extender:Arisbeth Purrington, MStark Klein YKurtis Bushmanin Treatment: 24 Diagnosis Coding ICD-10 Codes Code Description [id number] ulcer of left buttock, stage 4 G82.20 Paraplegia, unspecified E11.622 Type 2 diabetes mellitus with other skin ulcer M86.68 Other chronic osteomyelitis, other site Facility Procedures The patient participates with Medicare or their insurance follows the Medicare Facility Guidelines: CPT4 Code Description Modifier  Quantity [id number] - WOUND CARE VISIT-LEV 4 EST PT 1 Physician Procedures CPT4 Code: [id number] Description: 10626 - WC PHYS LEVEL 3 - EST PT ICD-10 Diagnosis Description 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, stage 4 M86.68 Other chronic osteomyelitis, other site G82.20 Paraplegia, unspecified Modifier: Quantity: 1 Electronic Signature(s) Signed: 06/16/2019 6:00:33 PM By: Deon Pilling Signed: 06/18/2019 7:03:15 AM By: Linton Ham MD Entered By: Deon Pilling on 06/16/2019 16:05:06

## 2019-06-30 ENCOUNTER — Encounter (HOSPITAL_BASED_OUTPATIENT_CLINIC_OR_DEPARTMENT_OTHER): Payer: Medicare Other | Admitting: Internal Medicine

## 2019-07-14 ENCOUNTER — Other Ambulatory Visit: Payer: Self-pay

## 2019-07-14 ENCOUNTER — Encounter (HOSPITAL_BASED_OUTPATIENT_CLINIC_OR_DEPARTMENT_OTHER): Payer: Medicare Other | Attending: Internal Medicine | Admitting: Internal Medicine

## 2019-07-14 DIAGNOSIS — G894 Chronic pain syndrome: Secondary | ICD-10-CM | POA: Insufficient documentation

## 2019-07-14 DIAGNOSIS — G8220 Paraplegia, unspecified: Secondary | ICD-10-CM | POA: Diagnosis not present

## 2019-07-14 DIAGNOSIS — E119 Type 2 diabetes mellitus without complications: Secondary | ICD-10-CM | POA: Diagnosis not present

## 2019-07-14 DIAGNOSIS — Z853 Personal history of malignant neoplasm of breast: Secondary | ICD-10-CM | POA: Diagnosis not present

## 2019-07-14 DIAGNOSIS — Z87891 Personal history of nicotine dependence: Secondary | ICD-10-CM | POA: Insufficient documentation

## 2019-07-14 DIAGNOSIS — L98419 Non-pressure chronic ulcer of buttock with unspecified severity: Secondary | ICD-10-CM | POA: Diagnosis present

## 2019-07-14 DIAGNOSIS — L89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, stage 4: Secondary | ICD-10-CM | POA: Diagnosis not present

## 2019-07-14 DIAGNOSIS — E039 Hypothyroidism, unspecified: Secondary | ICD-10-CM | POA: Insufficient documentation

## 2019-07-14 DIAGNOSIS — I10 Essential (primary) hypertension: Secondary | ICD-10-CM | POA: Diagnosis not present

## 2019-07-14 DIAGNOSIS — M8668 Other chronic osteomyelitis, other site: Secondary | ICD-10-CM | POA: Diagnosis not present

## 2019-07-14 NOTE — Progress Notes (Signed)
[name], [name] ([id number]) Visit Report for 07/14/2019 HPI Details Patient Name: Date of Service: [name], [name] 07/14/2019 10:15 AM Medical Record Number:[id number] Patient Account Number: [id number] Date of Birth/Sex: Treating RN: 1950-10-29 (69 y.o. [name] Primary Care Provider: [name] Other Clinician: Referring Provider: Treating Provider/Extender:Telisa Ohlsen, [name], [name] in Treatment: 28 History of Present Illness Location: open ulceration of the left gluteal area, left heel and right ankle for about 5 months. Quality: Patient reports No Pain. Severity: Patient states wound(s) are getting worse. Duration: Patient has had the wound for > 5 months prior to seeking treatment at the wound center Context: The wound occurred when the patient has been paraplegic for about 3 years. Modifying Factors: Wound improving due to current treatment. Associated Signs and Symptoms: Patient reports having foul odor. HPI Description: this 69 year old patient who is known to have hypertension, hypothyroidism, breast cancer, chronic pain syndrome, paraplegia was noted to have a left gluteal decubitus ulcer and was brought into the hospital. During the course of her hospitalization she was debrided in the operating room by Dr. Leland Johns and had all the wounds sharply debrided. The debridement was done for the left ischial wound, the left heel wound and the right ankle wound. Bone cultures were taken at that time but were negative but clinically she was treated for osteomyelitis because of the probing down to bone and open exposed bone. Home health has been giving her antibioticss which include vancomycin and Zosyn. The patient was a smoker until about 3 weeks ago and used to smoke about 10 cigarettes a day for a long while. 12/13/2014 - details of her operative note from 11/03/2014 were reviewed -- PROCEDURE: 1. Excisional debridement skin, subcutaneous, muscle left ischium 35 cm2 2.  Excisional debridement skin, subcutaneous tissue left heel 27 cm2 3. Excisional debridement right ankle skin, subcutaneous, bone 30 cm2 01/24/2015 -- she has some issues with her wheelchair cushion but other than that is doing very well and has received Podus boots for her feet. 02/14/2015 -- she was using her old offloading boots and this seemed to have caused her a new pressure ulcer on the left posterior heel near the superior part just below the Achilles tendon. 03/07/2015 -- she has a new ulceration just to the left of the midline on her sacral region more on the left buttock and this has been there for about a week. 08/22/2015 -- was recently admitted to hospital between May 5 and 08/13/2015, with sepsis and leukocytosis due to a UTI. she was treated for a sepsis complicating Escherichia coli UTI and kidney stones. She also had metabolic and careful up at the secondary to pyelonephritis. He received broad-spectrum antibiotics initially and then received Macrobid as per urology. She was sent home on nitrofurantoin. during her admission she had a CT scan which showed exposed left ischial tuberosity without evidence of osteolysis. 09/12/2015-- the patient is having some issues with her air mattress and would like to get a opinion from medical modalities. 10/10/2015 -- the issue with her air mattress has not yet been sorted out and the new problem seems to be a lot of odor from the wound VAC. 11/27/2015 -- the patient was admitted to the hospital between July 23 and 10/31/2015. Her problems were sepsis, osteomyelitis of the pelvic bone and acute pyelonephritis. CT of the abdomen and pelvis was consistent with a left- sided pyelonephritis with hydronephrosis and also just showed new sclerosis of the posterior portion of the left anterior pubic ramus suggestive of  periosteal reaction consistent with osteomyelitis. She was treated for the osteomyelitis with infectious disease consult recommending  6 weeks of IV antibiotics including vancomycin and Rocephin and the antibiotics were to go on until 12/10/2015. He was seen by Dr. Iran Planas plastic surgery and Dr. Linus Salmons of infectious disease. She had a suprapubic catheter placed during the admission. CT scan done on 10/28/2015 showed specifically -- New sclerosis of the posterior portion of the left inferior pubic ramus with aggressive periosteal reaction, consistent with osteomyelitis, with adjacent soft tissue gas compatible with previously described decubitus ulcer. 12/12/2015 -- she was recently seen by Dr. Linus Salmons, who noted good improvement and CRP and ESR compared to before and he has stopped her antibiotic as per plans to finish on September 4. The patient was encouraged to continue with wound care and consider hyperbaric oxygen therapy. Today she tells me that she has consented to undergo hyperbaric oxygen therapy and we can start the paperwork. 01/02/2016 -- her PCP had gained about 3 years but she still persists in having problems during hyperbaric oxygen therapy with some discomfort in the ears. 01/09/16; pressure area with underlying osteomyelitis in the left buttock. Wound bed itself has some slight amount of grayish surface slough however I do not think any debridement was necessary. There is no exposed bone soft tissue appears stable. She is using a wound VAC 01/16/16; back for weekly wound review in conjunction with HBO. She has a deep wound over the left initial tuberosity previously treated with 6 weeks of IV antibiotics for osteomyelitis. Wound bed looks reasonably healthy although the base of this is still precariously close to bone. She has been using a wound VAC. 01/23/2016 -- she has completed her course of antibiotics and this week the only new thing is her right great toe nail was avulsed and she has got an open wound over the nailbed. 01/31/16 she has completed her course of antibiotics. Her right great toenail avulsed  last week and she's been using silver alginate for this as well. Still using a wound VAC to the substantial stage IV wound over the left ischial tuberosity 03/05/2016 -- the patient has had a opinion from the plastic surgery group at [facility] and details of this are not available yet but the patient's verbal report has been heard by me. Did not sound like there was any optimistic discussion regarding reconstruction and the net result would be to continue with the wound VAC application. I will await the official reports. Addendum: -- she was seen at [facility] by Dr. Tressa Busman. After a thorough review and from what I understand spending 45 minutes with the patient his assessment has been noted by me in detail and the management options were: 1. Continued pressure offloading and wound care versus operative procedures including wound excision 2. Soft tissue and bone sampling 3. If the wound gets larger wound closure would be done using a variety of plastic surgical techniques including but not limited to skin substitute, possible skin graft, local versus regional flaps, negative pressure dressing application. 4. He discussed with her details of flap surgery and the risks associated 5. He made a comment that since the patient was operated on by Dr. Leland Johns of [facility] plastic surgery unit in [place] the patient may continue to follow-up there for further evaluation for surgical flap closure in the future. 03/19/2016 -- the patient continues to be rather depressed and frustrated with her lack of rapid progress in  healing this wound especially because she thought after hyperbaric oxygen therapy the wound would heal extremely fast. She now understands that was not the implied benefit on wound care which was the recommendation for hyperbaric oxygen therapy. I have had a lengthy discussion with the patient and her husband regarding her  options: 1. Continue with collagen and wound VAC for the primary dressing and offloading and all supportive care. 2. See Dr. Iran Planas for possible placement of Acell or Integra in the OR. 3. get a second opinion from a wound care center and surrounding regions/counties 05/07/2016 -- Note from Dr. Celedonio Miyamoto, who noted that the patient has declined flap surgery. She has discussed application of A cell, and try a few applications to see how the wound progresses. She is also recommended that we could apply products here in the wound center, like Oasis. during her preop workup it was found that her hemoglobin A1c was 11% and she has now been diagnosed as having diabetes mellitus and has been put on appropriate treatment by her PCP 05/28/2016 -- tells me her blood sugars have been doing well and she has an appointment to see her PCP in the next couple of weeks to check her hemoglobin A1c. Other than that she continues to do well. 06/25/2016 -- have not seen her back for the last month but she says her health has been about the same and she has an appointment to check the A1c next week 09/10/16 ---- was seen by Dr. Celedonio Miyamoto -- who applied Acell and saw her back in follow-up. She has recommended silver alginate to the wound every other day and cover with foam. If no significant drainage could transition to collagen every other day. She recommended discontinuing wound VAC. There were no plans to repeat application of Acell. The patient expressed that her husband could do the wound care as going to the Wound Ctr., would cost several $100 for each visit. 10/21/2016 -- her insurance company is getting her new mattress and she is pleased about that. Other than that she has been doing dressings with PolyMem Silver and has been doing very well 02/18/2017 -- she has gone through several changes of her mattress and has not been pleased with any of them. The ventricles are still working on trying to  fit her with the appropriate low air-loss mattress. She has a new wound on the gluteal area which is clearly separated from the original wound. 03/25/17-she is here in follow-up evaluation for her left ischialpressure ulcer. She remains unsatisfied with her pressure mattress. She admits to sitting multiple hours a day, in the bed. We have discussed offloading options. The wound does not appear infected. Nutrition does not appear to be a concern. Will follow-up in 4 weeks, if wound continues to be stalled may consider x-ray to evaluate for refractory osteomyelitis. 04/21/17; this is a patient that I don't know all that well. She has a chronic wound which at one point had underlying osteomyelitis in the left ischial tuberosity. This is a stage IV pressure ulcer. Over the last 3 months she has a stage II wound inferiorly to the original wound. The last time she was here her dressing was changed to silver collagen although the patient's husband who changes the dressing said that the collagen stuck to the wound and remove skin from the superficial area therefore he switched back to [place] 05/13/17; this is a patient we've been following for a left ischial tuberosity wound which was stage IV  at one point had underlying osteomyelitis. Over the last several months she's had a stage II wound just inferior and medial to the related to the wound. According to her husband he is using Endoform layer with collagen although this is not what I had last time. According to her husband they are using Elgie [place] with collagen although I don't quite know how that started. She was hospitalized from 1/20 through 04/30/16. This was related to a UTI. Her blood cultures were negative, urine culture showed multiple species. She did have a CT scan of the abdomen and pelvis which documented chronic osteomyelitis in the area of the wound inflammatory markers were unremarkable. She has had prior knowledge of osteomyelitis.  It looks as though she received IV antibiotics in 2017 and was treated with a course of hyperbaric oxygen. 05/28/17; the wound over the left ischial tuberosity is deeper today and abuts clearly on bone. Nursing intake reported drainage. I therefore culture of the wound. The more superficial area just below this looks about the same. They once again complained that there are mattress cover is not working although apparently [facility] is been noted to see this many times in the report is that the device is functional 06/18/17; the patient had a probing area on the left ischial tuberosity that was draining purulent fluid last time. This also clearly seemed to have open bone. Culture I did showed pansensitive pseudomonas including third generation cephalosporins. I treated this with cefdinir 300 twice a day for 10 days and things seem to have improved. She has a more superficial wound just underneath this area. Amazingly she has a new air mattress through [facility]. I think they gave this to her as a parking give. In any case this now works according to the patient may have something to do with why the areas are looking better. 07/09/17; the patient has a probing area in the left ischial tuberosity that still has some depth. However this is contracted in terms of the wound orifice although the depth is still roughly the same. There is no undermining. She also has the satellite wound which is more superficial. This appears to have a healthy surface we've been using silver collagen 08/06/17; the patient's wound is over the left ischial tuberosity and a satellite lesion just underneath this. The original wound was actually a deep stage 4 wound. We have made good progress in 2 months and there is no longer exposed bone here. 09/03/17; left ischial tuberosity actually appears to be quite healthy. I think we are making progress. No debridement is required. There is no surrounding  erythema 10/01/17 I follow this patient monthly for her left ischial tuberosity wound. There is 2 areas the original area and a satellite area. The satellite area looks a lot better there is no surrounding erythema. Her husband relates that he is having trouble maintaining the dressing. This has to do with the soft tissue around it. He states he puts the collagen in but he cannot make sure that it stays in even with the ABD pads and tape that he is been using 10/29/17; patient arrives with a better looking noon today. Some of the satellite lesions have closed. using Prisma 11/26/17; the patient has a large cone-shaped area with the tip of the Cone deep within her buttock soft tissue. The walls of the Cone are epithelialized however the base is still open. The area at the base of this looks moist we've been using silver collagen. Will  change to silver alginate 12/31/2017; the wound appears to have come in fairly nicely. Using silver alginate. There is no surrounding maceration or infection 01/28/18; there is still an open area here over the left initial tuberosity. Base of this however looks healthy. There is no surrounding infection 02/25/18; the area of its open is over the left ischial tuberosity. The base of this is where the wound is. This is a large inverted cone-shaped area with the wound at the tip. Dimensions of the wound at the tip are improved. There is a area of denuded skin about halfway towards the tip which her husband thinks may have happened today when he was bathing her. 04/20/17; the area is still open over the left initial tuberosity. 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 This is now completely closed over although it is closed over with a divot and skin over bone at the base of this.  Continued aggressive offloading will be necessary. 12/30/2018 READMISSION This is a 69 year old woman with chronic paraplegia. I picked her up for her care from Dr. Con Memos in this clinic after he departed. She had a stage IV pressure wound over the left ischial tuberosity. She was treated twice for her underlying osteomyelitis and this I believe firstly in 2016 and again in 2017. There were some plans at some point for flap closure of this however she was discovered to have uncontrolled diabetes and I do not think this was ever accomplished. She ultimately healed over in this clinic and was discharged in May. She has a large cone-shaped indentation with the tip of this going towards the left ischial tuberosity. It is not an easy area to examine but at that time I thought all of this was epithelialized. Apparently there was a reopening here shortly after she left the clinic last time. She was admitted to hospital at the end of June for Klebsiella bacteremia felt to be secondary to UTI. A CT scan of the pelvis is listed below and there was initially some concern that she had underlying osteomyelitis although I believe she was seen by infectious disease and that was felt to be not the case: I do not see any new cultures or inflammatory markers IMPRESSION: 1. No CT evidence for acute intra-abdominal or pelvic abnormality. Large volume of stool throughout the colon. 2. Enlarged fatty liver with fat sparing near the gallbladder fossa 3. Cortical scarring right kidney. Bilateral intrarenal stones without hydronephrosis. Thick-walled urinary bladder decompressed by suprapubic catheter 4. Deep left decubitus ulcer with underlying left ischial changes suggesting osteomyelitis. Her husband has been using silver collagen in the wound. She has not been systemically unwell no fever chills eating and drinking well. They rigorously offload this wound only getting up in the wheelchair when she is going  to appointments the rest of the time she is in bed. 10/8; wound measures larger and she now has exposed bone. We have been using silver alginate 11/12 still using silver alginate. The patient saw Dr. Megan Salon of infectious disease. She was started on Augmentin 500 mg twice daily. She is due to follow-up with Dr. Megan Salon I believe next week. Lab work Dr. Megan Salon requested showed a sedimentation rate of 28 and CRP of 20 although her CRP 1 year ago was 18.8. Sedimentation rate 1 year ago  was 11 basic metabolic panel showed a creatinine of 1.12 12/3; the patient followed up with Dr. Megan Salon yesterday. She is still on Augmentin twice daily. This was directed by Dr. Megan Salon. The patient's inflammatory markers have improved which is gratifying. Her C-reactive protein was repeated yesterday and follow-up booked with infectious disease in January. In addition I have been getting secure text messages I think from palliative care through [facility] [employer]. I think they were hoping to provide services to the patient in her home. They could not get a hold of the primary physician and so they reached out to me on 2 separate occasions. 12/17; patient last saw Dr. Megan Salon on 12/2. She is finishing up with Augmentin. Her C-reactive protein was 20 on 10/21, 10.1 on 11/19 and 17 on 12/2. The wound itself still has depth and undermining. We are using Santyl with the backing wet-to-dry 04/27/2019. The wound is gradually clearing up in terms of the surface although it is not filled in that much. Still abuts right against bone 2/4; patient with a deep pressure ulcer over the left ischial tuberosity. I thought she was going to follow-up with infectious disease to follow her inflammatory markers although the patient states that they stated that they did not need to see her unless we felt it was necessary. I will need to check their notes. In any case we ordered moistened silver collagen back  with wet-to-dry to fill in the depth of the wound although apparently [facility] sent silver alginate which they have been using since they were here the last time. Is obviously not what we ordered. 2/25. Not much change in this wound it is over the left ischial tuberosity recurrent wound. We have been using silver collagen with backing wet-to-dry. I think the wound is about the same. There is still some tunneling from about 10- 12 o'clock over the ischial tuberosity itself 3/11; pressure ulcer over the left ischial tuberosity. Since she was last here the wound VAC was started and apparently going quite well. We are able to get the home health company that accepts [place] healthcare which is in itself sometimes problematic. There is been improvements in the wound the tunneling seems to be better and is contracted nicely 4/8; 1 month follow-up. Since she was last here we have been using silver collagen under a wound VAC. Some minor contraction I think in wound volume. She is cared for diligently by her husband including pressure relief, incontinence management, nutritional support etc. Electronic Signature(s) Signed: 07/14/2019 5:58:56 PM By: Linton Ham MD Entered By: Linton Ham on 07/14/2019 12:51:31 -------------------------------------------------------------------------------- Physical Exam Details Patient Name: Date of Service: [name], [name] 07/14/2019 10:15 AM Medical Record [id number] Patient Account Number: [id number] Date of Birth/Sex: Treating RN: June 04, 1950 (69 y.o. [name] Primary Care Provider: [name] Other Clinician: Referring Provider: Treating Provider/Extender:Kajal Scalici, [name], [name] in Treatment: 28 Notes Wound exam; left ischial tuberosity. I think this is contracted some. There is no exposed bone the tunneling has improved. The wound bed is not as vibrant as I might like to see but I did not think any mechanical debridement was necessary.  There is no surrounding infection in the soft tissues. Electronic Signature(s) Signed: 07/14/2019 5:58:56 PM By: Linton Ham MD Entered By: Linton Ham on 07/14/2019 12:52:56 -------------------------------------------------------------------------------- Physician Orders Details Patient Name: Date of Service: [name], [name] 07/14/2019 10:15 AM Medical Record [id number] Patient Account Number: [id number] Date of Birth/Sex: Treating RN: Jul 01, 1950 (69 y.o. [name], [name] Primary  Care Provider: [name] Other Clinician: Referring Provider: Treating Provider/Extender:Omara Alcon, [name], [name] in Treatment: 28 Verbal / Phone Orders: No Diagnosis Coding ICD-10 Coding Code Description 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, stage 4 G82.20 Paraplegia, unspecified E11.622 Type 2 diabetes mellitus with other skin ulcer M86.68 Other chronic osteomyelitis, other site Follow-up Appointments Return appointment in 3 weeks. - Thursday Dressing Change Frequency Change dressing three times week. - wound vac Skin Barriers/Peri-Wound Care Skin Prep - to periwound. Wound Cleansing Wound #12 Left Ischial Tuberosity May shower and wash wound with soap and water. - or wound cleanser Primary Wound Dressing Wound #12 Left Ischial Tuberosity Collagen - apply collagen under wound vac. collagen with wet to dry in clinic only. wound vac to be reapplied by home health on Friday. Secondary Dressing Wound #12 Left Ischial Tuberosity Foam Border - in clinic. Negative Presssure Wound Therapy Wound #12 Left Ischial Tuberosity Wound Vac to wound continuously at 144m/hg pressure - ***Please apply drape under black foam when bridging the wound vac.*** Black Foam - ***Please apply drape under black foam when bridging the wound vac.*** Off-Loading Low air-loss mattress (Group 2) - continue to use Gel wheelchair cushion - continue to use specialty wheelchair cushion. Turn and reposition  every 2 hours [facility]. - [facility] change wound vac dressing three times a week. ***Please apply drape under black foam when bridging the wound vac.*** Electronic Signature(s) Signed: 07/14/2019 5:58:56 PM By: RLinton HamMD Signed: 07/14/2019 6:08:37 PM By: DDeon PillingEntered By: DDeon Pillingon 07/14/2019 11:20:47 -------------------------------------------------------------------------------- Problem List Details Patient Name: Date of Service: [name], STIEFEL4/11/2019 10:15 AM Medical Record [id number] Account Number: [id number] of Birth/Sex: Treating RN: 512/05/1950(69y.o. 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, stage 4 12/30/2018 No Yes G82.20 Paraplegia, unspecified 12/30/2018 No Yes E11.622 Type 2 diabetes mellitus with other skin ulcer 12/30/2018 No Yes M86.68 Other chronic osteomyelitis, other site 02/17/2019 No Yes Inactive Problems Resolved Problems Electronic Signature(s) Signed: 07/14/2019 5:58:56 PM By: RLinton HamMD Entered By: RLinton Hamon 07/14/2019 12:50:03 -------------------------------------------------------------------------------- Progress Note Details Patient Name: Date of Service: [name], WIEMANN4/11/2019 10:15 AM Medical Record [id number] Account Number: [id number] of Birth/Sex: Treating RN: [id number]. FHelene [name] BTammi KlippelPrimary Care Provider: SSandi MariscalOther Clinician: Referring Provider: Treating Provider/Extender:Eann Cleland, MStark Klein YKurtis Bushmanin Treatment: 28 Subjective History of Present Illness (HPI) The following HPI elements were documented for the patient's  wound: Location: open ulceration of the left gluteal area, left heel and right ankle for about 5 months. Quality: Patient reports No Pain. Severity: Patient states wound(s) are getting worse. Duration: Patient has had the wound for > 5 months prior to seeking treatment at the wound center Context: The wound occurred when the patient has been paraplegic for about 3 years. Modifying Factors: Wound improving due to current treatment. Associated Signs and Symptoms: Patient reports having foul odor. this 69year old patient who is known to have hypertension, hypothyroidism, breast cancer, chronic pain syndrome, paraplegia was noted to have a left gluteal decubitus ulcer and was brought into the hospital. During the course of her hospitalization she was debrided in the operating room by Dr. TLeland Johnsand had all the wounds sharply debrided. The debridement was done for the left  ischial wound, the left heel wound and the right ankle wound. Bone cultures were taken at that time but were negative but clinically she was treated for osteomyelitis because of the probing down to bone and open exposed bone. Home health has been giving her antibioticss which include vancomycin and Zosyn. The patient was a smoker until about 3 weeks ago and used to smoke about 10 cigarettes a day for a long while. 12/13/2014 - details of her operative note from 11/03/2014 were reviewed -- PROCEDURE: 1. Excisional debridement skin, subcutaneous, muscle left ischium 35 cm2 2. Excisional debridement skin, subcutaneous tissue left heel 27 cm2 3. Excisional debridement right ankle skin, subcutaneous, bone 30 cm2 01/24/2015 -- she has some issues with her wheelchair cushion but other than that is doing very well and has received Podus boots for her feet. 02/14/2015 -- she was using her old offloading boots and this seemed to have caused her a new pressure ulcer on the left posterior heel near the superior part just below the Achilles  tendon. 03/07/2015 -- she has a new ulceration just to the left of the midline on her sacral region more on the left buttock and this has been there for about a week. 08/22/2015 -- was recently admitted to hospital between May 5 and 08/13/2015, with sepsis and leukocytosis due to a UTI. she was treated for a sepsis complicating Escherichia coli UTI and kidney stones. She also had metabolic and careful up at the secondary to pyelonephritis. He received broad-spectrum antibiotics initially and then received Macrobid as per urology. She was sent home on nitrofurantoin. during her admission she had a CT scan which showed exposed left ischial tuberosity without evidence of osteolysis. 09/12/2015-- the patient is having some issues with her air mattress and would like to get a opinion from medical modalities. 10/10/2015 -- the issue with her air mattress has not yet been sorted out and the new problem seems to be a lot of odor from the wound VAC. 11/27/2015 -- the patient was admitted to the hospital between July 23 and 10/31/2015. Her problems were sepsis, osteomyelitis of the pelvic bone and acute pyelonephritis. CT of the abdomen and pelvis was consistent with a left- sided pyelonephritis with hydronephrosis and also just showed new sclerosis of the posterior portion of the left anterior pubic ramus suggestive of periosteal reaction consistent with osteomyelitis. She was treated for the osteomyelitis with infectious disease consult recommending 6 weeks of IV antibiotics including vancomycin and Rocephin and the antibiotics were to go on until 12/10/2015. He was seen by Dr. Iran Planas plastic surgery and Dr. Linus Salmons of infectious disease. She had a suprapubic catheter placed during the admission. CT scan done on 10/28/2015 showed specifically -- New sclerosis of the posterior portion of the left inferior pubic ramus with aggressive periosteal reaction, consistent with osteomyelitis, with adjacent soft  tissue gas compatible with previously described decubitus ulcer. 12/12/2015 -- she was recently seen by Dr. Linus Salmons, who noted good improvement and CRP and ESR compared to before and he has stopped her antibiotic as per plans to finish on September 4. The patient was encouraged to continue with wound care and consider hyperbaric oxygen therapy. Today she tells me that she has consented to undergo hyperbaric oxygen therapy and we can start the paperwork. 01/02/2016 -- her PCP had gained about 3 years but she still persists in having problems during hyperbaric oxygen therapy with some discomfort in the ears. 01/09/16; pressure area with underlying osteomyelitis in the left buttock. Wound  bed itself has some slight amount of grayish surface slough however I do not think any debridement was necessary. There is no exposed bone soft tissue appears stable. She is using a wound VAC 01/16/16; back for weekly wound review in conjunction with HBO. She has a deep wound over the left initial tuberosity previously treated with 6 weeks of IV antibiotics for osteomyelitis. Wound bed looks reasonably healthy although the base of this is still precariously close to bone. She has been using a wound VAC. 01/23/2016 -- she has completed her course of antibiotics and this week the only new thing is her right great toe nail was avulsed and she has got an open wound over the nailbed. 01/31/16 she has completed her course of antibiotics. Her right great toenail avulsed last week and she's been using silver alginate for this as well. Still using a wound VAC to the substantial stage IV wound over the left ischial tuberosity 03/05/2016 -- the patient has had a opinion from the plastic surgery group at [facility] and details of this are not available yet but the patient's verbal report has been heard by me. Did not sound like there was any optimistic discussion regarding reconstruction and the net result would be to continue  with the wound VAC application. I will await the official reports. Addendum: -- she was seen at [facility] by Dr. Tressa Busman. After a thorough review and from what I understand spending 45 minutes with the patient his assessment has been noted by me in detail and the management options were: 1. Continued pressure offloading and wound care versus operative procedures including wound excision 2. Soft tissue and bone sampling 3. If the wound gets larger wound closure would be done using a variety of plastic surgical techniques including but not limited to skin substitute, possible skin graft, local versus regional flaps, negative pressure dressing application. 4. He discussed with her details of flap surgery and the risks associated 5. He made a comment that since the patient was operated on by Dr. Leland Johns of [facility] plastic surgery unit in [place] the patient may continue to follow-up there for further evaluation for surgical flap closure in the future. 03/19/2016 -- the patient continues to be rather depressed and frustrated with her lack of rapid progress in healing this wound especially because she thought after hyperbaric oxygen therapy the wound would heal extremely fast. She now understands that was not the implied benefit on wound care which was the recommendation for hyperbaric oxygen therapy. I have had a lengthy discussion with the patient and her husband regarding her options: 1. Continue with collagen and wound VAC for the primary dressing and offloading and all supportive care. 2. See Dr. Iran Planas for possible placement of Acell or Integra in the OR. 3. get a second opinion from a wound care center and surrounding regions/counties 05/07/2016 -- Note from Dr. Celedonio Miyamoto, who noted that the patient has declined flap surgery. She has discussed application of A cell, and try a few applications to see how the wound  progresses. She is also recommended that we could apply products here in the wound center, like Oasis. during her preop workup it was found that her hemoglobin A1c was 11% and she has now been diagnosed as having diabetes mellitus and has been put on appropriate treatment by her PCP 05/28/2016 -- tells me her blood sugars have been doing well and she has an appointment to see her PCP  in the next couple of weeks to check her hemoglobin A1c. Other than that she continues to do well. 06/25/2016 -- have not seen her back for the last month but she says her health has been about the same and she has an appointment to check the A1c next week 09/10/16 ---- was seen by Dr. Celedonio Miyamoto -- who applied Acell and saw her back in follow-up. She has recommended silver alginate to the wound every other day and cover with foam. If no significant drainage could transition to collagen every other day. She recommended discontinuing wound VAC. There were no plans to repeat application of Acell. The patient expressed that her husband could do the wound care as going to the Wound Ctr., would cost several $100 for each visit. 10/21/2016 -- her insurance company is getting her new mattress and she is pleased about that. Other than that she has been doing dressings with PolyMem Silver and has been doing very well 02/18/2017 -- she has gone through several changes of her mattress and has not been pleased with any of them. The ventricles are still working on trying to fit her with the appropriate low air-loss mattress. She has a new wound on the gluteal area which is clearly separated from the original wound. 03/25/17-she is here in follow-up evaluation for her left ischialpressure ulcer. She remains unsatisfied with her pressure mattress. She admits to sitting multiple hours a day, in the bed. We have discussed offloading options. The wound does not appear infected. Nutrition does not appear to be a concern. Will  follow-up in 4 weeks, if wound continues to be stalled may consider x-ray to evaluate for refractory osteomyelitis. 04/21/17; this is a patient that I don't know all that well. She has a chronic wound which at one point had underlying osteomyelitis in the left ischial tuberosity. This is a stage IV pressure ulcer. Over the last 3 months she has a stage II wound inferiorly to the original wound. The last time she was here her dressing was changed to silver collagen although the patient's husband who changes the dressing said that the collagen stuck to the wound and remove skin from the superficial area therefore he switched back to [place] 05/13/17; this is a patient we've been following for a left ischial tuberosity wound which was stage IV at one point had underlying osteomyelitis. Over the last several months she's had a stage II wound just inferior and medial to the related to the wound. According to her husband he is using Endoform layer with collagen although this is not what I had last time. According to her husband they are using Elgie [place] with collagen although I don't quite know how that started. She was hospitalized from 1/20 through 04/30/16. This was related to a UTI. Her blood cultures were negative, urine culture showed multiple species. She did have a CT scan of the abdomen and pelvis which documented chronic osteomyelitis in the area of the wound inflammatory markers were unremarkable. She has had prior knowledge of osteomyelitis. It looks as though she received IV antibiotics in 2017 and was treated with a course of hyperbaric oxygen. 05/28/17; the wound over the left ischial tuberosity is deeper today and abuts clearly on bone. Nursing intake reported drainage. I therefore culture of the wound. The more superficial area just below this looks about the same. They once again complained that there are mattress cover is not working although apparently [facility] is been  noted to see this  many times in the report is that the device is functional 06/18/17; the patient had a probing area on the left ischial tuberosity that was draining purulent fluid last time. This also clearly seemed to have open bone. Culture I did showed pansensitive pseudomonas including third generation cephalosporins. I treated this with cefdinir 300 twice a day for 10 days and things seem to have improved. She has a more superficial wound just underneath this area. Amazingly she has a new air mattress through [facility]. I think they gave this to her as a parking give. In any case this now works according to the patient may have something to do with why the areas are looking better. 07/09/17; the patient has a probing area in the left ischial tuberosity that still has some depth. However this is contracted in terms of the wound orifice although the depth is still roughly the same. There is no undermining. ooShe also has the satellite wound which is more superficial. This appears to have a healthy surface we've been using silver collagen 08/06/17; the patient's wound is over the left ischial tuberosity and a satellite lesion just underneath this. The original wound was actually a deep stage 4 wound. We have made good progress in 2 months and there is no longer exposed bone here. 09/03/17; left ischial tuberosity actually appears to be quite healthy. I think we are making progress. No debridement is required. There is no surrounding erythema 10/01/17 I follow this patient monthly for her left ischial tuberosity wound. There is 2 areas the original area and a satellite area. The satellite area looks a lot better there is no surrounding erythema. Her husband relates that he is having trouble maintaining the dressing. This has to do with the soft tissue around it. He states he puts the collagen in but he cannot make sure that it stays in even with the ABD pads and tape that he is been  using 10/29/17; patient arrives with a better looking noon today. Some of the satellite lesions have closed. using Prisma 11/26/17; the patient has a large cone-shaped area with the tip of the Cone deep within her buttock soft tissue. The walls of the Cone are epithelialized however the base is still open. The area at the base of this looks moist we've been using silver collagen. Will change to silver alginate 12/31/2017; the wound appears to have come in fairly nicely. Using silver alginate. There is no surrounding maceration or infection 01/28/18; there is still an open area here over the left initial tuberosity. Base of this however looks healthy. There is no surrounding infection 02/25/18; the area of its open is over the left ischial tuberosity. The base of this is where the wound is. This is a large inverted cone-shaped area with the wound at the tip. Dimensions of the wound at the tip are improved. There is a area of denuded skin about halfway towards the tip which her husband thinks may have happened today when he was bathing her. 04/20/17; the area is still open over the left initial tuberosity. 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 This is now completely closed over although it is closed over with a divot and skin over bone at the base of this. Continued aggressive offloading will be necessary. 12/30/2018 READMISSION  This is a 69 year old woman with chronic paraplegia. I picked her up for her care from Dr. Con Memos in this clinic after he departed. She had a stage IV pressure wound over the left ischial tuberosity. She was treated twice for her underlying osteomyelitis and this I believe firstly in 2016 and again in 2017. There were some plans at some point for flap closure of this however she  was discovered to have uncontrolled diabetes and I do not think this was ever accomplished. She ultimately healed over in this clinic and was discharged in May. She has a large cone-shaped indentation with the tip of this going towards the left ischial tuberosity. It is not an easy area to examine but at that time I thought all of this was epithelialized. Apparently there was a reopening here shortly after she left the clinic last time. She was admitted to hospital at the end of June for Klebsiella bacteremia felt to be secondary to UTI. A CT scan of the pelvis is listed below and there was initially some concern that she had underlying osteomyelitis although I believe she was seen by infectious disease and that was felt to be not the case: I do not see any new cultures or inflammatory markers IMPRESSION: 1. No CT evidence for acute intra-abdominal or pelvic abnormality. Large volume of stool throughout the colon. 2. Enlarged fatty liver with fat sparing near the gallbladder fossa 3. Cortical scarring right kidney. Bilateral intrarenal stones without hydronephrosis. Thick-walled urinary bladder decompressed by suprapubic catheter 4. Deep left decubitus ulcer with underlying left ischial changes suggesting osteomyelitis. Her husband has been using silver collagen in the wound. She has not been systemically unwell no fever chills eating and drinking well. They rigorously offload this wound only getting up in the wheelchair when she is going to appointments the rest of the time she is in bed. 10/8; wound measures larger and she now has exposed bone. We have been using silver alginate 11/12 still using silver alginate. The patient saw Dr. Megan Salon of infectious disease. She was started on Augmentin 500 mg twice daily. She is due to follow-up with Dr. Megan Salon I believe next week. Lab work Dr. Megan Salon requested showed a sedimentation rate of 28 and CRP of 20 although her CRP 1 year ago was 18.8.  Sedimentation rate 1 year ago was 11 basic metabolic panel showed a creatinine of 1.12 12/3; the patient followed up with Dr. Megan Salon yesterday. She is still on Augmentin twice daily. This was directed by Dr. Megan Salon. The patient's inflammatory markers have improved which is gratifying. Her C-reactive protein was repeated yesterday and follow-up booked with infectious disease in January. In addition I have been getting secure text messages I think from palliative care through [facility] [employer]. I think they were hoping to provide services to the patient in her home. They could not get a hold of the primary physician and so they reached out to me on 2 separate occasions. 12/17; patient last saw Dr. Megan Salon on 12/2. She is finishing up with Augmentin. Her C-reactive protein was 20 on 10/21, 10.1 on 11/19 and 17 on 12/2. The wound itself still has depth and undermining. We are using Santyl with the backing wet-to-dry 04/27/2019. The wound is gradually clearing up in terms of the surface although it is not filled in that much. Still abuts right against bone 2/4; patient with a deep pressure ulcer over the left ischial tuberosity. I thought she was going to follow-up with  infectious disease to follow her inflammatory markers although the patient states that they stated that they did not need to see her unless we felt it was necessary. I will need to check their notes. In any case we ordered moistened silver collagen back with wet-to-dry to fill in the depth of the wound although apparently [facility] sent silver alginate which they have been using since they were here the last time. Is obviously not what we ordered. 2/25. Not much change in this wound it is over the left ischial tuberosity recurrent wound. We have been using silver collagen with backing wet-to-dry. I think the wound is about the same. There is still some tunneling from about 10- 12 o'clock over the ischial  tuberosity itself 3/11; pressure ulcer over the left ischial tuberosity. Since she was last here the wound VAC was started and apparently going quite well. We are able to get the home health company that accepts [place] healthcare which is in itself sometimes problematic. There is been improvements in the wound the tunneling seems to be better and is contracted nicely 4/8; 1 month follow-up. Since she was last here we have been using silver collagen under a wound VAC. Some minor contraction I think in wound volume. She is cared for diligently by her husband including pressure relief, incontinence management, nutritional support etc. Objective Constitutional Vitals Time Taken: 10:39 AM, Height: 63 in, Source: Stated, Weight: 185 lbs, Source: Stated, BMI: 32.8, Temperature: 98.8 F, Pulse: 84 bpm, Respiratory Rate: 18 breaths/min, Blood Pressure: 146/67 mmHg. Integumentary (Hair, Skin) Wound #12 status is Open. Original cause of wound was Pressure Injury. The wound is located on the Left Ischial Tuberosity. The wound measures 1.6cm length x 2.3cm width x 2.3cm depth; 2.89cm^2 area and 6.648cm^3 volume. There is Fat Layer (Subcutaneous Tissue) Exposed exposed. There is no tunneling or undermining noted. There is a medium amount of serosanguineous drainage noted. The wound margin is well defined and not attached to the wound base. There is large (67-100%) pink granulation within the wound bed. There is a small (1-33%) amount of necrotic tissue within the wound bed including Adherent Slough. Assessment Active Problems ICD-10 Pressure ulcer of left buttock, stage 4 Paraplegia, unspecified Type 2 diabetes mellitus with other skin ulcer Other chronic osteomyelitis, other site Plan Follow-up Appointments: Return appointment in 3 weeks. - Thursday Dressing Change Frequency: Change dressing three times week. - wound vac Skin Barriers/Peri-Wound Care: Skin Prep - to periwound. Wound  Cleansing: Wound #12 Left Ischial Tuberosity: May shower and wash wound with soap and water. - or wound cleanser Primary Wound Dressing: Wound #12 Left Ischial Tuberosity: Collagen - apply collagen under wound vac. collagen with wet to dry in clinic only. wound vac to be reapplied by home health on Friday. Secondary Dressing: Wound #12 Left Ischial Tuberosity: Foam Border - in clinic. Negative Presssure Wound Therapy: Wound #12 Left Ischial Tuberosity: Wound Vac to wound continuously at 155m/hg pressure - ***Please apply drape under black foam when bridging the wound vac.*** Black Foam - ***Please apply drape under black foam when bridging the wound vac.*** Off-Loading: Low air-loss mattress (Group 2) - continue to use Gel wheelchair cushion - continue to use specialty wheelchair cushion. Turn and reposition every 2 hours [facility] - [facility] change wound vac dressing three times a week. ***Please apply drape under black foam when bridging the wound vac.*** 1. We are continuing with wound VAC with collagen under the phone  2. I think this is coming in somewhat. The undermining areas are better. Electronic Signature(s) Signed: 07/14/2019 5:58:56 PM By: Linton Ham MD Entered By: Linton Ham on 07/14/2019 12:54:16 -------------------------------------------------------------------------------- SuperBill Details Patient Name: Date of Service: [name], [name] 07/14/2019 Medical Record Number:[id number] Patient Account Number: [id number] Date of Birth/Sex: Treating RN: Jun 03, 1950 (69 y.o. [name], [name] Primary Care Provider: [name] Other Clinician: Referring Provider: Treating Provider/Extender:Kyce Ging, [name], [name] in Treatment: 28 Diagnosis Coding ICD-10 Codes Code Description 3520179983 Pressure ulcer of left buttock, stage 4 G82.20 Paraplegia, unspecified E11.622 Type 2 diabetes mellitus  with other skin ulcer M86.68 Other chronic osteomyelitis, other site Facility Procedures The patient participates with Medicare or their insurance follows the Medicare Facility Guidelines: CPT4 Code Description Modifier Quantity [id number] [place] VISIT-LEV 4 EST PT 1 Physician Procedures CPT4 Code: 1848592 Description: 76394 - WC PHYS LEVEL 3 - EST PT ICD-10 Diagnosis Description 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, stage 4 G82.20 Paraplegia, unspecified Modifier: Quantity: 1 Electronic Signature(s) Signed: 07/14/2019 5:58:56 PM By: Linton Ham MD Entered By: Linton Ham on 07/14/2019 12:55:26

## 2019-07-15 NOTE — Progress Notes (Signed)
[name], [name] ([id number]) Visit Report for 07/14/2019 Arrival Information Details Patient Name: Date of Service: [name], [name] 07/14/2019 10:15 AM Medical Record Number:[id number] Patient Account Number: [id number] Date of Birth/Sex: Treating RN: Jan 02, 1951 (69 y.o. [name] Primary Care Salene Mohamud: [name] Other Clinician: Referring Ranetta Armacost: Treating Tomisha Reppucci/Extender:[name], [name], [name] in Treatment: 16 Visit Information History Since Last Visit Added or deleted any medications: No Patient Arrived: Wheel Chair Any new allergies or adverse reactions: No Arrival Time: 10:37 Had a fall or experienced change in No activities of daily living that may affect Accompanied By: self risk of falls: Transfer Assistance: Manual Signs or symptoms of abuse/neglect since last No Patient Identification Verified: Yes visito Secondary Verification Process Completed: Yes Hospitalized since last visit: No Patient Requires Transmission-Based No Implantable device outside of the clinic excluding No Precautions: cellular tissue based products placed in the center Patient Has Alerts: No since last visit: Has Dressing in Place as Prescribed: Yes Pain Present Now: No Electronic Signature(s) Signed: 07/15/2019 6:10:03 PM By: Baruch Gouty RN, BSN Entered By: Baruch Gouty on 07/14/2019 10:39:26 -------------------------------------------------------------------------------- Clinic Level of Care Assessment Details Patient Name: Date of Service: [name], [name] 07/14/2019 10:15 AM Medical Record Number:[id number] Patient Account Number: [id number] Date of Birth/Sex: Treating RN: Dec 15, 1950 (69 y.o. [name], [name] Primary Care Lenell Mcconnell: [name] Other Clinician: Referring Wellington Winegarden: Treating Donavan Kerlin/Extender:[name], [name], [name] in Treatment: 28 Clinic Level of Care Assessment Items TOOL 4 Quantity Score X - Use when only an EandM is performed on FOLLOW-UP  visit 1 0 ASSESSMENTS - Nursing Assessment / Reassessment X - Reassessment of Co-morbidities (includes updates in patient status) 1 10 X - Reassessment of Adherence to Treatment Plan 1 5 ASSESSMENTS - Wound and Skin Assessment / Reassessment '[]'$  - Simple Wound Assessment / Reassessment - one wound 0 X - Complex Wound Assessment / Reassessment - multiple wounds 1 5 X - Dermatologic / Skin Assessment (not related to wound area) 1 10 ASSESSMENTS - Focused Assessment '[]'$  - Circumferential Edema Measurements - multi extremities 0 X - Nutritional Assessment / Counseling / Intervention 1 10 '[]'$  - Lower Extremity Assessment (monofilament, tuning fork, pulses) 0 '[]'$  - Peripheral Arterial Disease Assessment (using hand held doppler) 0 ASSESSMENTS - Ostomy and/or Continence Assessment and Care '[]'$  - Incontinence Assessment and Management 0 '[]'$  - Ostomy Care Assessment and Management (repouching, etc.) 0 PROCESS - Coordination of Care '[]'$  - Simple Patient / Family Education for ongoing care 0 X - Complex (extensive) Patient / Family Education for ongoing care 1 20 X - Staff obtains Programmer, systems, Records, Test Results / Process Orders 1 10 X - Staff telephones HHA, Nursing Homes / Clarify orders / etc 1 10 '[]'$  - Routine Transfer to another Facility (non-emergent condition) 0 '[]'$  - Routine Hospital Admission (non-emergent condition) 0 '[]'$  - New Admissions / Biomedical engineer / Ordering NPWT, Apligraf, etc. 0 '[]'$  - Emergency Hospital Admission (emergent condition) 0 '[]'$  - Simple Discharge Coordination 0 X - Complex (extensive) Discharge Coordination 1 15 PROCESS - Special Needs '[]'$  - Pediatric / Minor Patient Management 0 '[]'$  - Isolation Patient Management 0 '[]'$  - Hearing / Language / Visual special needs 0 '[]'$  - Assessment of Community assistance (transportation, D/C planning, etc.) 0 '[]'$  - Additional assistance / Altered mentation 0 '[]'$  - Support Surface(s) Assessment (bed, cushion, seat, etc.) 0 INTERVENTIONS  - Wound Cleansing / Measurement '[]'$  - Simple Wound Cleansing - one wound 0 X - Complex Wound Cleansing - multiple wounds 1 5 X - Wound  Imaging (photographs - any number of wounds) 1 5 '[]'$  - Wound Tracing (instead of photographs) 0 '[]'$  - Simple Wound Measurement - one wound 0 X - Complex Wound Measurement - multiple wounds 1 5 INTERVENTIONS - Wound Dressings '[]'$  - Small Wound Dressing one or multiple wounds 0 X - Medium Wound Dressing one or multiple wounds 1 15 '[]'$  - Large Wound Dressing one or multiple wounds 0 '[]'$  - Application of Medications - topical 0 '[]'$  - Application of Medications - injection 0 INTERVENTIONS - Miscellaneous '[]'$  - External ear exam 0 '[]'$  - Specimen Collection (cultures, biopsies, blood, body fluids, etc.) 0 '[]'$  - Specimen(s) / Culture(s) sent or taken to Lab for analysis 0 '[]'$  - Patient Transfer (multiple staff / Civil Service fast streamer / Similar devices) 0 '[]'$  - Simple Staple / Suture removal (25 or less) 0 '[]'$  - Complex Staple / Suture removal (26 or more) 0 '[]'$  - Hypo / Hyperglycemic Management (close monitor of Blood Glucose) 0 '[]'$  - Ankle / Brachial Index (ABI) - do not check if billed separately 0 X - Vital Signs 1 5 Has the patient been seen at the hospital within the last three years: Yes Total Score: 130 Level Of Care: New/Established - Level 4 Electronic Signature(s) Signed: 07/14/2019 6:08:37 PM By: Deon Pilling Entered By: Deon Pilling on 07/14/2019 11:21:56 -------------------------------------------------------------------------------- Encounter Discharge Information Details Patient Name: Date of Service: [name], [name] 07/14/2019 10:15 AM Medical Record [id number] Patient Account Number: [id number] Date of Birth/Sex: Treating RN: 12-25-1950 (69 y.o. [name] Primary Care Joelene Barriere: [name] Other Clinician: Referring Abcde Oneil: Treating Gwendloyn Forsee/Extender:[name], [name], [name] in Treatment: 28 Encounter Discharge Information Items Discharge  Condition: Stable Ambulatory Status: Wheelchair Discharge Destination: Home Transportation: Private Auto Accompanied By: self Schedule Follow-up Appointment: Yes Clinical Summary of Care: Patient Declined Electronic Signature(s) Signed: 07/15/2019 6:10:03 PM By: Baruch Gouty RN, BSN Entered By: Baruch Gouty on 07/14/2019 11:51:55 -------------------------------------------------------------------------------- Lower Extremity Assessment Details Patient Name: Date of Service: [name], [name] 07/14/2019 10:15 AM Medical Record [id number] Patient Account Number: [id number] Date of Birth/Sex: Treating RN: June 22, 1950 (69 y.o. [name] Primary Care Jaxsyn Azam: [name] Other Clinician: Referring Brittini Brubeck: Treating Oleda Borski/Extender:[name], [name], [name] in Treatment: 28 Electronic Signature(s) Signed: 07/15/2019 6:10:03 PM By: Baruch Gouty RN, BSN Entered By: Baruch Gouty on 07/14/2019 10:40:47 -------------------------------------------------------------------------------- Multi Wound Chart Details Patient Name: Date of Service: [name], LEGROS 07/14/2019 10:15 AM Medical Record [id number] Patient Account Number: [id number] Date of Birth/Sex: Treating RN: 1951-03-08 (69 y.o. [name], [name] Primary Care Disney Ruggiero: [name] Other Clinician: Referring Rolando Whitby: Treating Kynli Chou/Extender:[name], [name], [name] in Treatment: 28 Vital Signs Height(in): 1 Pulse(bpm): 44 Weight(lbs): 185 Blood Pressure(mmHg): 146/67 Body Mass Index(BMI): 33 Temperature(F): 98.8 Respiratory 18 Rate(breaths/min): Photos: [12:No Photos] [N/A:N/A] Wound Location: [12:Left Ischial Tuberosity] [N/A:N/A] Wounding Event: [12:Pressure Injury] [N/A:N/A] Primary Etiology: [12:Pressure Ulcer] [N/A:N/A] Comorbid History: [12:Anemia, Hypertension, TypeN/A II Diabetes, Osteoarthritis, Dementia, Paraplegia, Received Radiation] Date Acquired: [12:09/06/2018]  [N/A:N/A] Weeks of Treatment: [12:28] [N/A:N/A] Wound Status: [12:Open] [N/A:N/A] Measurements L x W x D 1.6x2.3x2.3 [N/A:N/A] (cm) Area (cm) : [12:2.89] [N/A:N/A] Volume (cm) : [12:6.648] [N/A:N/A] % Reduction in Area: [12:-1124.60%] [N/A:N/A] % Reduction in Volume: -3035.80% [N/A:N/A] Classification: [12:Category/Stage IV] [N/A:N/A] Exudate Amount: [12:Medium] [N/A:N/A] Exudate Type: [12:Serosanguineous] [N/A:N/A] Exudate Color: [12:red, brown] [N/A:N/A] Wound Margin: [12:Well defined, not attached N/A] Granulation Amount: [12:Large (67-100%)] [N/A:N/A] Granulation Quality: [12:Pink] [N/A:N/A] Necrotic Amount: [12:Small (1-33%)] [N/A:N/A] Exposed Structures: [12:Fat Layer (Subcutaneous N/A Tissue) Exposed: Yes Fascia: No Tendon: No Muscle: No Joint: No Bone:  No None] [N/A:N/A] Treatment Notes Wound #12 (Left Ischial Tuberosity) 3. Primary Dressing Applied Collagen Other primary dressing (specifiy in notes) 4. Secondary Dressing Foam Border Dressing Notes saline moistened gauze packing gauze Electronic Signature(s) Signed: 07/14/2019 5:58:56 PM By: Linton Ham MD Signed: 07/14/2019 6:08:37 PM By: Deon Pilling Entered By: Linton Ham on 07/14/2019 12:50:46 -------------------------------------------------------------------------------- Multi-Disciplinary Care Plan Details Patient Name: Date of Service: DENNISSE, [name] 07/14/2019 10:15 AM Medical Record [id number] Patient Account Number: [id number] Date of Birth/Sex: Treating RN: 02/13/1951 (69 y.o. [name], [name] Primary Care Iszabella Hebenstreit: [name] Other Clinician: Referring Kahealani Yankovich: Treating Witten Certain/Extender:[name], [name], [name] in Treatment: 28 Active Inactive Abuse / Safety / Falls / Self Care Management Nursing Diagnoses: Impaired physical mobility Potential for falls Goals: Patient will remain injury free related to falls Date Initiated: 12/30/2018 Target Resolution Date: 08/05/2019 Goal  Status: Active Patient/caregiver will verbalize/demonstrate measure taken to improve self care Date Inactivated: 01/13/2019 Target10/22/2020 Resolution Date Initiated: 12/30/2018 Date: Goal Status: Met Interventions: Assess fall risk on admission and as needed Provide education on fall prevention Notes: Electronic Signature(s) Signed: 07/14/2019 6:08:37 PM By: Deon Pilling Entered By: Deon Pilling on 07/14/2019 11:11:40 -------------------------------------------------------------------------------- Pain Assessment Details Patient Name: Date of Service: [name], [name] 07/14/2019 10:15 AM Medical Record [id number] Patient Account Number: [id number] Date of Birth/Sex: Treating RN: 03/11/1951 (69 y.o. [name] Primary Care Field Staniszewski: [name] Other Clinician: Referring Ronzell Laban: Treating Kimber Fritts/Extender:[name], [name], [name] in Treatment: 28 Active Problems Location of Pain Severity and Description of Pain Patient Has Paino No Site Locations Rate the pain. Current Pain Level: 0 Pain Management and Medication Current Pain Management: Electronic Signature(s) Signed: 07/15/2019 6:10:03 PM By: Baruch Gouty RN, BSN Entered By: Baruch Gouty on 07/14/2019 10:40:21 -------------------------------------------------------------------------------- Patient/Caregiver Education Details Patient Name: Date of Service: Betha Loa 4/8/2021andnbsp10:15 AM Medical Record [id number] Patient Account Number: [id number] Date of Birth/Gender: Treating RN: [birth year] (69 y.o. [name], [name] Primary Care Physician: [name] Other Clinician: Referring Physician: Treating Physician/Extender:[name], [name], [name] in Treatment: 28 Education Assessment Education Provided To: Patient Education Topics Provided Safety: Handouts: Personal Safety Methods: Explain/Verbal Responses: Reinforcements needed Electronic Signature(s) Signed: 07/14/2019  6:08:37 PM By: Deon Pilling Entered By: Deon Pilling on 07/14/2019 11:11:52 -------------------------------------------------------------------------------- Wound Assessment Details Patient Name: Date of Service: [name], [name] 07/14/2019 10:15 AM Medical Record [id number] Patient Account Number: [id number] Date of Birth/Sex: Treating RN: [birth year] (69 y.o. Martyn Malay, Vaughan Basta Primary Care Lirio Bach: [name] Other Clinician: Referring Skylan Gift: Treating Karla Pavone/Extender:[name], [name], [name] in Treatment: 28 Wound Status Wound Number: 12 Primary Pressure Ulcer Etiology: Wound Location: Left Ischial Tuberosity Wound Open Wounding Event: Pressure Injury Status: Date Acquired: 09/06/2018 Comorbid Anemia, Hypertension, Type II Diabetes, Weeks Of Treatment: 28 History: Osteoarthritis, Dementia, Paraplegia, Received Clustered Wound: No Radiation Wound Measurements Length: (cm) 1.6 % Reductio Width: (cm) 2.3 % Reductio Depth: (cm) 2.3 Epithelial Area: (cm) 2.89 Tunneling Volume: (cm) 6.648 Undermini Wound Description Classification: Category/Stage IV Wound Margin: Well defined, not attached Exudate Amount: Medium Exudate Type: Serosanguineous Exudate Color: red, brown Wound Bed Granulation Amount: Large (67-100%) Granulation Quality: Pink Necrotic Amount: Small (1-33%) Necrotic Quality: Adherent Slough Foul Odor After Cleansing: No Slough/Fibrino Yes Exposed Structure Fascia Exposed: No Fat Layer (Subcutaneous Tissue) Exposed: Yes Tendon Exposed: No Muscle Exposed: No Joint Exposed: No Bone Exposed: No n in Area: -1124.6% n in Volume: -3035.8% ization: None : No ng: No Treatment Notes Wound #12 (Left Ischial Tuberosity) 3. Primary Dressing Applied Collagen Other primary dressing (specifiy in notes) 4. Secondary  Dressing Foam Border Dressing Notes saline moistened gauze packing gauze Electronic Signature(s) Signed: 07/15/2019 6:10:03 PM By:  Baruch Gouty RN, BSN Entered By: Baruch Gouty on 07/14/2019 10:45:21 -------------------------------------------------------------------------------- [place] Details Patient Name: Date of Service: [name], [name] 07/14/2019 10:15 AM Medical Record [id number] Patient Account Number: [id number] Date of Birth/Sex: Treating RN: 10/13/1950 (69 y.o. [name] Primary Care Jaquez Farrington: [name] Other Clinician: Referring Cristal Qadir: Treating Areeba Sulser/Extender:[name], [name], [name] in Treatment: 28 Vital Signs Time Taken: 10:39 Temperature (F): 98.8 Height (in): 63 Pulse (bpm): 84 Source: Stated Respiratory Rate (breaths/min): 18 Weight (lbs): 185 Blood Pressure (mmHg): 146/67 Source: Stated Reference Range: 80 - 120 mg / dl Body Mass Index (BMI): 32.8 Electronic Signature(s) Signed: 07/15/2019 6:10:03 PM By: Baruch Gouty RN, BSN Entered By: Baruch Gouty on 07/14/2019 10:40:12

## 2019-07-20 NOTE — Progress Notes (Signed)
[name] ([id number]) Visit Report for 06/02/2019 Arrival Information Details Patient Name: Date of Service: [name] 06/02/2019 10:15 AM Medical Record Number:[id number] Patient Account Number: [id number] Date of Birth/Sex: Treating RN: 05-05-1950 (69 y.o. [name], Meta.Reding Primary Care Hamdi Vari: [name] Other Clinician: Referring [name]: Treating Tukker Byrns/Extender:[name], [name], [name] in Treatment: 23 Visit Information History Since Last Visit Added or deleted any medications: No Patient Arrived: Wheel Chair Any new allergies or adverse reactions: No Arrival Time: 10:38 Had a fall or experienced change in No activities of daily living that may affect Accompanied By: husband risk of falls: Transfer Assistance: None Signs or symptoms of abuse/neglect since last No Patient Identification Verified: Yes visito Secondary Verification Process Completed: Yes Hospitalized since last visit: No Patient Requires Transmission-Based No Implantable device outside of the clinic excluding No Precautions: cellular tissue based products placed in the center Patient Has Alerts: No since last visit: Has Dressing in Place as Prescribed: Yes Pain Present Now: No Electronic Signature(s) Signed: 07/20/2019 9:23:26 AM By: [name] Entered By: [name] on 06/02/2019 10:39:09 -------------------------------------------------------------------------------- Clinic Level of Care Assessment Details Patient Name: Date of Service: [name], [name] 06/02/2019 10:15 AM Medical Record Number:[id number] Patient Account Number: [id number] Date of Birth/Sex: Treating RN: [birth year] (69 y.o. [name], [name] Primary Care [name]: [name] Other Clinician: Referring Onda Kattner: Treating [name]/Extender:[name], [name], [name] in Treatment: 22 Clinic Level of Care Assessment Items TOOL 4 Quantity Score X - Use when only an EandM is performed on FOLLOW-UP visit  1 0 ASSESSMENTS - Nursing Assessment / Reassessment X - Reassessment of Co-morbidities (includes updates in patient status) 1 10 X - Reassessment of Adherence to Treatment Plan 1 5 ASSESSMENTS - Wound and Skin Assessment / Reassessment X - Simple Wound Assessment / Reassessment - one wound 1 5 '[]'$  - Complex Wound Assessment / Reassessment - multiple wounds 0 X - Dermatologic / Skin Assessment (not related to wound area) 1 10 ASSESSMENTS - Focused Assessment '[]'$  - Circumferential Edema Measurements - multi extremities 0 X - Nutritional Assessment / Counseling / Intervention 1 10 '[]'$  - Lower Extremity Assessment (monofilament, tuning fork, pulses) 0 '[]'$  - Peripheral Arterial Disease Assessment (using hand held doppler) 0 ASSESSMENTS - Ostomy and/or Continence Assessment and Care '[]'$  - Incontinence Assessment and Management 0 '[]'$  - Ostomy Care Assessment and Management (repouching, etc.) 0 PROCESS - Coordination of Care X - Simple Patient / Family Education for ongoing care 1 15 '[]'$  - Complex (extensive) Patient / Family Education for ongoing care 0 X - Staff obtains Programmer, systems, Records, Test Results / Process Orders 1 10 X - Staff telephones HHA, Nursing Homes / Clarify orders / etc 1 10 '[]'$  - Routine Transfer to another Facility (non-emergent condition) 0 '[]'$  - Routine Hospital Admission (non-emergent condition) 0 '[]'$  - New Admissions / Biomedical engineer / Ordering NPWT, Apligraf, etc. 0 '[]'$  - Emergency Hospital Admission (emergent condition) 0 X - Simple Discharge Coordination 1 10 '[]'$  - Complex (extensive) Discharge Coordination 0 PROCESS - Special Needs '[]'$  - Pediatric / Minor Patient Management 0 '[]'$  - Isolation Patient Management 0 '[]'$  - Hearing / Language / Visual special needs 0 '[]'$  - Assessment of Community assistance (transportation, D/C planning, etc.) 0 '[]'$  - Additional assistance / Altered mentation 0 '[]'$  - Support Surface(s) Assessment (bed, cushion, seat, etc.) 0 INTERVENTIONS -  Wound Cleansing / Measurement X - Simple Wound Cleansing - one wound 1 5 '[]'$  - Complex Wound Cleansing - multiple wounds 0 X - Wound Imaging (photographs -  any number of wounds) 1 5 '[]'$  - Wound Tracing (instead of photographs) 0 X - Simple Wound Measurement - one wound 1 5 '[]'$  - Complex Wound Measurement - multiple wounds 0 INTERVENTIONS - Wound Dressings '[]'$  - Small Wound Dressing one or multiple wounds 0 X - Medium Wound Dressing one or multiple wounds 1 15 '[]'$  - Large Wound Dressing one or multiple wounds 0 '[]'$  - Application of Medications - topical 0 '[]'$  - Application of Medications - injection 0 INTERVENTIONS - Miscellaneous '[]'$  - External ear exam 0 '[]'$  - Specimen Collection (cultures, biopsies, blood, body fluids, etc.) 0 '[]'$  - Specimen(s) / Culture(s) sent or taken to Lab for analysis 0 '[]'$  - Patient Transfer (multiple staff / Civil Service fast streamer / Similar devices) 0 '[]'$  - Simple Staple / Suture removal (25 or less) 0 '[]'$  - Complex Staple / Suture removal (26 or more) 0 '[]'$  - Hypo / Hyperglycemic Management (close monitor of Blood Glucose) 0 '[]'$  - Ankle / Brachial Index (ABI) - do not check if billed separately 0 X - Vital Signs 1 5 Has the patient been seen at the hospital within the last three years: Yes Total Score: 120 Level Of Care: New/Established - Level 4 Electronic Signature(s) Signed: 06/02/2019 6:07:18 PM By: Deon Pilling Entered By: Deon Pilling on 06/02/2019 13:47:31 -------------------------------------------------------------------------------- Encounter Discharge Information Details Patient Name: Date of Service: [name], [name] 06/02/2019 10:15 AM Medical Record [id number] Patient Account Number: [id number] Date of Birth/Sex: Treating RN: [birth year] (69 y.o. [name] Primary Care Smantha Boakye: [name] Other Clinician: Referring Areg Bialas: Treating Ixel Boehning/Extender:[name], [name], [name] in Treatment: 22 Encounter Discharge Information Items Discharge  Condition: Stable Ambulatory Status: Wheelchair Discharge Destination: Home Transportation: Private Auto Accompanied By: spouse Schedule Follow-up Appointment: Yes Clinical Summary of Care: Patient Declined Electronic Signature(s) Signed: 06/02/2019 5:28:31 PM By: Baruch Gouty RN, BSN Entered By: Baruch Gouty on 06/02/2019 12:33:29 -------------------------------------------------------------------------------- Multi Wound Chart Details Patient Name: Date of Service: [name], [name] 06/02/2019 10:15 AM Medical Record [id number] Patient Account Number: [id number] Date of Birth/Sex: Treating RN: 07-May-1950 (68 y.o. [name], Meta.Reding Primary Care Tahliyah Anagnos: [name] Other Clinician: Referring Nuriya Stuck: Treating Peyton Rossner/Extender:[name], [name], [name] in Treatment: 22 Vital Signs Height(in): 77 Pulse(bpm): 55 Weight(lbs): 185 Blood Pressure(mmHg): 103/56 Body Mass Index(BMI): 33 Temperature(F): 97.8 Respiratory 18 Rate(breaths/min): Photos: [12:No Photos] [N/A:N/A] Wound Location: [12:Left Ischial Tuberosity] [N/A:N/A] Wounding Event: [12:Pressure Injury] [N/A:N/A] Primary Etiology: [12:Pressure Ulcer] [N/A:N/A] Comorbid History: [12:Anemia, Hypertension, TypeN/A II Diabetes, Osteoarthritis, Dementia, Paraplegia, Received Radiation] Date Acquired: [12:09/06/2018] [N/A:N/A] Weeks of Treatment: [12:22] [N/A:N/A] Wound Status: [12:Open] [N/A:N/A] Measurements L x W x D [12:3.5x2.5x2.2] [N/A:N/A] (cm) Area (cm) : [12:6.872] [N/A:N/A] Volume (cm) : [12:15.119] [N/A:N/A] % Reduction in Area: [12:-2811.90%] [N/A:N/A] % Reduction in Volume: [12:-7031.60%] [N/A:N/A] Classification: [12:Category/[name] IV] [N/A:N/A] Exudate Amount: [12:Medium] [N/A:N/A] Exudate Type: [12:Serosanguineous] [N/A:N/A] Exudate Color: [12:red, brown] [N/A:N/A] Wound Margin: [12:Well defined, not attached] [N/A:N/A] Granulation Amount: [12:Medium (34-66%)] [N/A:N/A] Granulation  Quality: [12:Pink] [N/A:N/A] Necrotic Amount: [12:Medium (34-66%)] [N/A:N/A] Exposed Structures: [12:Fat Layer (Subcutaneous Tissue) Exposed: Yes Fascia: No Tendon: No Muscle: No Joint: No Bone: No None] [N/A:N/A N/A] Treatment Notes Wound #12 (Left Ischial Tuberosity) 2. Periwound Care Skin Prep 3. Primary Dressing Applied Collegen AG 4. Secondary Dressing Dry Gauze Other secondary dressing (specify in notes) Foam Border Dressing Notes saline moistened gauze packing gauze Electronic Signature(s) Signed: 06/02/2019 5:41:09 PM By: Linton Ham MD Signed: 06/02/2019 6:07:18 PM By: Deon Pilling Entered By: Linton Ham on 06/02/2019 12:54:06 -------------------------------------------------------------------------------- [place] Details Patient Name:  Date of Service: [name], [name] 06/02/2019 10:15 AM Medical Record Number:[id number] Patient Account Number: [id number] Date of Birth/Sex: Treating RN: July 02, 1950 (69 y.o. [name], [name] Primary Care Drina Jobst: [name] Other Clinician: Referring Azriella Mattia: Treating Montavis Schubring/Extender:[name], [name], [name] in Treatment: 17 Active Inactive Abuse / Safety / Falls / Self Care Management Nursing Diagnoses: Impaired physical mobility Potential for falls Goals: Patient will remain injury free related to falls Date Initiated: 12/30/2018 Target Resolution Date: 06/10/2019 Goal Status: Active Patient/caregiver will verbalize/demonstrate measure taken to improve self care Target Resolution Date Initiated: 12/30/2018 Date Inactivated: 01/13/2019 Date: 01/27/2019 Goal Status: Met Interventions: Assess fall risk on admission and as needed Provide education on fall prevention Notes: Electronic Signature(s) Signed: 06/02/2019 6:07:18 PM By: Deon Pilling Entered By: Deon Pilling on 06/02/2019 10:36:04 -------------------------------------------------------------------------------- Pain Assessment  Details Patient Name: Date of Service: [name], [name] 06/02/2019 10:15 AM Medical Record [id number] Patient Account Number: [id number] Date of Birth/Sex: Treating RN: Mar 03, 1951 (69 y.o. [name], [name] Primary Care Kanoa Phillippi: [name] Other Clinician: Referring Ronie Fleeger: Treating Kahleel Fadeley/Extender:[name], [name], [name] in Treatment: 22 Active Problems Location of Pain Severity and Description of Pain Patient Has Paino No Site Locations Pain Management and Medication Current Pain Management: Electronic Signature(s) Signed: 06/02/2019 6:07:18 PM By: Deon Pilling Signed: 07/20/2019 9:23:26 AM By: [name] Entered By: [name] on 06/02/2019 10:39:46 -------------------------------------------------------------------------------- Patient/Caregiver Education Details Patient Name: Date of Service: [name] 2/25/2021andnbsp10:15 AM Medical Record [id number] Patient Account Number: [id number] Date of Birth/Gender: Treating RN: 06/08/1950 (69 y.o. [name], [name] Primary Care Physician: [name] Other Clinician: Referring Physician: Treating Physician/Extender:[name], [name], [name] in Treatment: 45 Education Assessment Education Provided To: Patient Education Topics Provided Safety: Handouts: Personal Safety Methods: Explain/Verbal Responses: Reinforcements needed Electronic Signature(s) Signed: 06/02/2019 6:07:18 PM By: Deon Pilling Entered By: Deon Pilling on 06/02/2019 10:36:15 -------------------------------------------------------------------------------- Wound Assessment Details Patient Name: Date of Service: [name], [name] 06/02/2019 10:15 AM Medical Record [id number] Patient Account Number: [id number] Date of Birth/Sex: Treating RN: 1950-10-11 (69 y.o. Benjamine Sprague, Briant Cedar Primary Care Mickayla Trouten: [name] Other Clinician: Referring Avni Traore: Treating Wilene Pharo/Extender:[name], [name], [name] in  Treatment: 22 Wound Status Wound Number: 12 Primary Pressure Ulcer Etiology: Wound Location: Left Ischial Tuberosity Wound Open Wounding Event: Pressure Injury Status: Date Acquired: 09/06/2018 Comorbid Anemia, Hypertension, Type II Diabetes, Weeks Of Treatment: 22 History: Osteoarthritis, Dementia, Paraplegia, Received Clustered Wound: No Radiation Photos Wound Measurements Length: (cm) 3.5 Width: (cm) 2.5 Depth: (cm) 2.2 Area: (cm) 6.872 Volume: (cm) 15.119 Wound Description Classification: Category/[name] IV Wound Margin: Well defined, not attached Exudate Amount: Medium Exudate Type: Serosanguineous Exudate Color: red, brown Wound Bed Granulation Amount: Medium (34-66%) Granulation Quality: Pink Necrotic Amount: Medium (34-66%) Necrotic Quality: Adherent Slough ter Cleansing: No no Yes Exposed Structure ed: No ubcutaneous Tissue) Exposed: Yes ed: No ed: No d: No : No % Reduction in Area: -2811.9% % Reduction in Volume: -7031.6% Epithelialization: None Tunneling: No Undermining: No Foul Odor Af Slough/Fibri Fascia Expos Fat Layer (S Tendon Expos Muscle Expos Joint Expose Bone Exposed Electronic Signature(s) Signed: 06/02/2019 5:25:06 PM By: Mikeal Hawthorne EMT/HBOT Signed: 06/06/2019 5:57:14 PM By: Levan Hurst RN, BSN Entered By: Mikeal Hawthorne on 06/02/2019 14:51:17 -------------------------------------------------------------------------------- Vitals Details Patient Name: Date of Service: [name], [name] 06/02/2019 10:15 AM Medical Record [id number] Patient Account Number: [id number] Date of Birth/Sex: Treating RN: [birth year] (69 y.o. [name] Primary Care Remell Giaimo: [name] Other Clinician: Referring Cing : Treating Meryem Haertel/Extender:[name], [name], [name] in Treatment: 22 Vital Signs Time Taken: 10:39 Temperature (  F): 97.8 Height (in): 63 Pulse (bpm): 83 Weight (lbs): 185 Respiratory Rate (breaths/min):  18 Body Mass Index (BMI): 32.8 Blood Pressure (mmHg): 103/56 Reference Range: 80 - 120 mg / dl Electronic Signature(s) Signed: 07/20/2019 9:23:26 AM By: [name] Entered By: [name] on 06/02/2019 10:39:41

## 2019-08-02 ENCOUNTER — Encounter (HOSPITAL_BASED_OUTPATIENT_CLINIC_OR_DEPARTMENT_OTHER): Payer: Medicare Other | Admitting: Internal Medicine

## 2019-08-09 ENCOUNTER — Encounter (HOSPITAL_BASED_OUTPATIENT_CLINIC_OR_DEPARTMENT_OTHER): Payer: Medicare Other | Admitting: Internal Medicine

## 2019-09-06 ENCOUNTER — Encounter (HOSPITAL_BASED_OUTPATIENT_CLINIC_OR_DEPARTMENT_OTHER): Payer: Medicare Other | Attending: Internal Medicine | Admitting: Internal Medicine

## 2019-09-06 ENCOUNTER — Other Ambulatory Visit: Payer: Self-pay

## 2019-09-07 NOTE — Progress Notes (Signed)
[name], [name] ([id number]) Visit Report for 09/06/2019 Arrival Information Details Patient Name: Date of Service: [name], [name] 09/06/2019 10:00 A M Medical Record Number: [id number] Patient Account Number: [id number] Date of Birth/Sex: Treating RN: 1951/02/27 (69 y.o. [name] Primary Care Leilany Digeronimo: [name] Other Clinician: Referring Michio Thier: Treating Milon Dethloff/Extender: Tommy Rainwater in Treatment: 59 Visit Information History Since Last Visit Added or deleted any medications: No Patient Arrived: Wheel Chair Any new allergies or adverse reactions: No Arrival Time: 09:58 Had a fall or experienced change in No Accompanied By: spouse activities of daily living that may affect Transfer Assistance: EasyPivot Patient Lift risk of falls: Patient Identification Verified: Yes Signs or symptoms of abuse/neglect since last visito No Secondary Verification Process Completed: Yes Hospitalized since last visit: No Patient Requires Transmission-Based Precautions: No Implantable device outside of the clinic excluding No Patient Has Alerts: No cellular tissue based products placed in the center since last visit: Has Dressing in Place as Prescribed: Yes Pain Present Now: No Electronic Signature(s) Signed: 09/06/2019 5:54:40 PM By: Baruch Gouty RN, BSN Entered By: Baruch Gouty on 09/06/2019 10:14:30 -------------------------------------------------------------------------------- Clinic Level of Care Assessment Details Patient Name: Date of Service: [name], [name] 09/06/2019 10:00 [place] Record Number: [id number] Patient Account Number: [id number] Date of Birth/Sex: Treating RN: [birth year] (69 y.o. [name] Primary Care Kerrilynn Derenzo: [name] Other Clinician: Referring Dontreal Miera: Treating Jennell Janosik/Extender: Tommy Rainwater in Treatment: 59 Clinic Level of Care Assessment Items TOOL 4 Quantity Score X- 1 0 Use when only an EandM  [name] performed on FOLLOW-UP visit ASSESSMENTS - Nursing Assessment / Reassessment X- 1 10 Reassessment of Co-morbidities (includes updates in patient status) X- 1 5 Reassessment of Adherence to Treatment Plan ASSESSMENTS - Wound and Skin A ssessment / Reassessment X - Simple Wound Assessment / Reassessment - one wound 1 5 '[]'$  - 0 Complex Wound Assessment / Reassessment - multiple wounds '[]'$  - 0 Dermatologic / Skin Assessment (not related to wound area) ASSESSMENTS - Focused Assessment '[]'$  - 0 Circumferential Edema Measurements - multi extremities '[]'$  - 0 Nutritional Assessment / Counseling / Intervention '[]'$  - 0 Lower Extremity Assessment (monofilament, tuning fork, pulses) '[]'$  - 0 Peripheral Arterial Disease Assessment (using hand held doppler) ASSESSMENTS - Ostomy and/or Continence Assessment and Care '[]'$  - 0 Incontinence Assessment and Management '[]'$  - 0 Ostomy Care Assessment and Management (repouching, etc.) PROCESS - Coordination of Care X - Simple Patient / Family Education for ongoing care 1 15 '[]'$  - 0 Complex (extensive) Patient / Family Education for ongoing care X- 1 10 Staff obtains Programmer, systems, Records, T Results / Process Orders est '[]'$  - 0 Staff telephones HHA, Nursing Homes / Clarify orders / etc '[]'$  - 0 Routine Transfer to another Facility (non-emergent condition) '[]'$  - 0 Routine Hospital Admission (non-emergent condition) '[]'$  - 0 New Admissions / Biomedical engineer / Ordering NPWT Apligraf, etc. , '[]'$  - 0 Emergency Hospital Admission (emergent condition) X- 1 10 Simple Discharge Coordination '[]'$  - 0 Complex (extensive) Discharge Coordination PROCESS - Special Needs '[]'$  - 0 Pediatric / Minor Patient Management '[]'$  - 0 Isolation Patient Management '[]'$  - 0 Hearing / Language / Visual special needs '[]'$  - 0 Assessment of Community assistance (transportation, D/C planning, etc.) '[]'$  - 0 Additional assistance / Altered mentation '[]'$  - 0 Support Surface(s) Assessment  (bed, cushion, seat, etc.) INTERVENTIONS - Wound Cleansing / Measurement X - Simple Wound Cleansing - one wound 1 5 '[]'$  - 0 Complex Wound  Cleansing - multiple wounds X- 1 5 Wound Imaging (photographs - any number of wounds) '[]'$  - 0 Wound Tracing (instead of photographs) X- 1 5 Simple Wound Measurement - one wound '[]'$  - 0 Complex Wound Measurement - multiple wounds INTERVENTIONS - Wound Dressings '[]'$  - 0 Small Wound Dressing one or multiple wounds X- 1 15 Medium Wound Dressing one or multiple wounds '[]'$  - 0 Large Wound Dressing one or multiple wounds X- 1 5 Application of Medications - topical '[]'$  - 0 Application of Medications - injection INTERVENTIONS - Miscellaneous '[]'$  - 0 External ear exam '[]'$  - 0 Specimen Collection (cultures, biopsies, blood, body fluids, etc.) '[]'$  - 0 Specimen(s) / Culture(s) sent or taken to Lab for analysis '[]'$  - 0 Patient Transfer (multiple staff / Civil Service fast streamer / Similar devices) '[]'$  - 0 Simple Staple / Suture removal (25 or less) '[]'$  - 0 Complex Staple / Suture removal (26 or more) '[]'$  - 0 Hypo / Hyperglycemic Management (close monitor of Blood Glucose) '[]'$  - 0 Ankle / Brachial Index (ABI) - do not check if billed separately X- 1 5 Vital Signs Has the patient been seen at the hospital within the last three years: Yes Total Score: 95 Level Of Care: New/Established - Level 3 Electronic Signature(s) Signed: 09/07/2019 4:36:43 PM By: Carlene Coria RN Entered By: Carlene Coria on 09/06/2019 11:13:00 -------------------------------------------------------------------------------- Encounter Discharge Information Details Patient Name: Date of Service: [name], [name] 09/06/2019 10:00 A M Medical Record Number: [id number] Patient Account Number: [id number] Date of Birth/Sex: Treating RN: 12-27-1950 (69 y.o. [name] Primary Care Frederick Marro: [name] Other Clinician: Referring Dimitrious Micciche: Treating Caileen Veracruz/Extender: Tommy Rainwater in  Treatment: 44 Encounter Discharge Information Items Discharge Condition: Stable Ambulatory Status: Wheelchair Discharge Destination: Home Transportation: Private Auto Accompanied By: husband Schedule Follow-up Appointment: Yes Clinical Summary of Care: Patient Declined Electronic Signature(s) Signed: 09/07/2019 7:13:26 AM By: Kela Millin Entered By: Kela Millin on 09/06/2019 11:21:36 -------------------------------------------------------------------------------- Lower Extremity Assessment Details Patient Name: Date of Service: [name], [name] 09/06/2019 10:00 [place] Record Number: [id number] Patient Account Number: [id number] Date of Birth/Sex: Treating RN: 1950/06/18 (69 y.o. [name] Primary Care Tranesha Lessner: [name] Other Clinician: Referring Solana Coggin: Treating Kaytelyn Glore/Extender: Tommy Rainwater in Treatment: 35 Electronic Signature(s) Signed: 09/06/2019 5:54:40 PM By: Baruch Gouty RN, BSN Entered By: Baruch Gouty on 09/06/2019 10:15:21 -------------------------------------------------------------------------------- Multi Wound Chart Details Patient Name: Date of Service: [name], [place] 09/06/2019 10:00 A M Medical Record Number: [id number] Patient Account Number: [id number] Date of Birth/Sex: Treating RN: 23-Jan-1951 (69 y.o. [name] Primary Care Jahleah Mariscal: [name] Other Clinician: Referring Takahiro Godinho: Treating Kanani Mowbray/Extender: Tommy Rainwater in Treatment: 35 Vital Signs Height(in): 84 Pulse(bpm): 41 Weight(lbs): 185 Blood Pressure(mmHg): 147/75 Body Mass Index(BMI): 33 Temperature(F): 98 Respiratory Rate(breaths/min): 18 Photos: [12:No Photos Left Ischial Tuberosity] [N/A:N/A N/A] Wound Location: [12:Pressure Injury] [N/A:N/A] Wounding Event: [12:Pressure Ulcer] [N/A:N/A] Primary Etiology: [12:Anemia, Hypertension, Type II] [N/A:N/A] Comorbid History: [12:Diabetes, Osteoarthritis, Dementia,  Paraplegia, Received Radiation 09/06/2018] [N/A:N/A] Date Acquired: [12:35] [N/A:N/A] Weeks of Treatment: [12:Open] [N/A:N/A] Wound Status: [12:2.7x2.1x2.3] [N/A:N/A] Measurements L x W x D (cm) [12:4.453] [N/A:N/A] A (cm) : rea [12:10.242] [N/A:N/A] Volume (cm) : [12:-1786.90%] [N/A:N/A] % Reduction in A rea: [12:-4731.10%] [N/A:N/A] % Reduction in Volume: [12:Category/Stage IV] [N/A:N/A] Classification: [12:Medium] [N/A:N/A] Exudate A mount: [12:Serosanguineous] [N/A:N/A] Exudate Type: [12:red, brown] [N/A:N/A] Exudate Color: [12:Distinct, outline attached] [N/A:N/A] Wound Margin: [12:Large (67-100%)] [N/A:N/A] Granulation A mount: [12:Red, Pink] [N/A:N/A] Granulation Quality: [12:None  Present (0%)] [N/A:N/A] Necrotic A mount: [12:Fat Layer (Subcutaneous Tissue)] [N/A:N/A] Exposed Structures: [12:Exposed: Yes Fascia: No Tendon: No Muscle: No Joint: No Bone: No Small (1-33%)] [N/A:N/A] Treatment Notes Wound #12 (Left Ischial Tuberosity) 1. Cleanse With Wound Cleanser 2. Periwound Care Skin Prep 3. Primary Dressing Applied Collegen AG 4. Secondary Dressing Foam Border Dressing Electronic Signature(s) Signed: 09/06/2019 5:52:05 PM By: Linton Ham MD Signed: 09/07/2019 4:36:43 PM By: Carlene Coria RN Entered By: Linton Ham on 09/06/2019 11:31:19 -------------------------------------------------------------------------------- Multi-Disciplinary Care Plan Details Patient Name: Date of Service: [name], Jajaira 09/06/2019 10:00 [place] Record Number: [id number] Patient Account Number: [id number] Date of Birth/Sex: Treating RN: 06/26/1950 (69 y.o. [name] Primary Care Stacia Feazell: [name] Other Clinician: Referring Nora Rooke: Treating Deshan Hemmelgarn/Extender: Tommy Rainwater in Treatment: 50 Active Inactive Abuse / Safety / Falls / Self Care Management Nursing Diagnoses: Impaired physical mobility Potential for falls Goals: Patient will remain  injury free related to falls Date Initiated: 12/30/2018 Target Resolution Date: 09/26/2019 Goal Status: Active Patient/caregiver will verbalize/demonstrate measure taken to improve self care Date Initiated: 12/30/2018 Date Inactivated: 01/13/2019 Target Resolution Date: 01/27/2019 Goal Status: Met Interventions: Assess fall risk on admission and as needed Provide education on fall prevention Notes: Electronic Signature(s) Signed: 09/07/2019 4:36:43 PM By: Carlene Coria RN Entered By: Carlene Coria on 09/06/2019 10:46:49 -------------------------------------------------------------------------------- Pain Assessment Details Patient Name: Date of Service: [name], [name] 09/06/2019 10:00 [place] Record Number: [id number] Patient Account Number: [id number] Date of Birth/Sex: Treating RN: [birth year] (69 y.o. [name] Primary Care Sidney Silberman: [name] Other Clinician: Referring Marjan Rosman: Treating Nealy Karapetian/Extender: Tommy Rainwater in Treatment: 35 Active Problems Location of Pain Severity and Description of Pain Patient Has Paino No Site Locations Rate the pain. Current Pain Level: 0 Pain Management and Medication Current Pain Management: Electronic Signature(s) Signed: 09/06/2019 5:54:40 PM By: Baruch Gouty RN, BSN Entered By: Baruch Gouty on 09/06/2019 10:15:15 -------------------------------------------------------------------------------- Patient/Caregiver Education Details Patient Name: Date of Service: [name], [name] 6/1/2021andnbsp10:00 [place] Record Number: [id number] Patient Account Number: [id number] Date of Birth/Gender: Treating RN: January 20, 1951 (69 y.o. [name] Primary Care Physician: [name] Other Clinician: Referring Physician: Treating Physician/Extender: Tommy Rainwater in Treatment: 36 Education Assessment Education Provided To: Patient Education Topics Provided Safety: Methods:  Explain/Verbal Responses: State content correctly Electronic Signature(s) Signed: 09/07/2019 4:36:43 PM By: Carlene Coria RN Entered By: Carlene Coria on 09/06/2019 10:47:06 -------------------------------------------------------------------------------- Wound Assessment Details Patient Name: Date of Service: [name], [name] 09/06/2019 10:00 [place] Record Number: [id number] Patient Account Number: [id number] Date of Birth/Sex: Treating RN: 1950-10-06 (69 y.o. Martyn Malay, Vaughan Basta Primary Care Deaire Mcwhirter: [name] Other Clinician: Referring Hendrix Yurkovich: Treating Kinney Sackmann/Extender: Tommy Rainwater in Treatment: 35 Wound Status Wound Number: 12 Primary Pressure Ulcer Etiology: Wound Location: Left Ischial Tuberosity Wound Open Wounding Event: Pressure Injury Status: Date Acquired: 09/06/2018 Comorbid Anemia, Hypertension, Type II Diabetes, Osteoarthritis, Weeks Of Treatment: 35 History: Dementia, Paraplegia, Received Radiation Clustered Wound: No Photos Photo Uploaded By: Mikeal Hawthorne on 09/07/2019 08:33:24 Wound Measurements Length: (cm) 2.7 Width: (cm) 2.1 Depth: (cm) 2.3 Area: (cm) 4.453 Volume: (cm) 10.242 % Reduction in Area: -1786.9% % Reduction in Volume: -4731.1% Epithelialization: Small (1-33%) Tunneling: No Undermining: No Wound Description Classification: Category/Stage IV Wound Margin: Distinct, outline attached Exudate Amount: Medium Exudate Type: Serosanguineous Exudate Color: red, brown Foul Odor After Cleansing: No Slough/Fibrino No Wound Bed Granulation Amount: Large (67-100%) Exposed Structure Granulation Quality:  Red, Pink Fascia Exposed: No Necrotic Amount: None Present (0%) Fat Layer (Subcutaneous Tissue) Exposed: Yes Tendon Exposed: No Muscle Exposed: No Joint Exposed: No Bone Exposed: No Treatment Notes Wound #12 (Left Ischial Tuberosity) 1. Cleanse With Wound Cleanser 2. Periwound Care Skin Prep 3. Primary Dressing  Applied Collegen AG 4. Secondary Dressing Foam Border Dressing Electronic Signature(s) Signed: 09/06/2019 5:54:40 PM By: Baruch Gouty RN, BSN Entered By: Baruch Gouty on 09/06/2019 10:16:15 -------------------------------------------------------------------------------- [place] Details Patient Name: Date of Service: [name], [name] 09/06/2019 10:00 A M Medical Record Number: [id number] Patient Account Number: [id number] Date of Birth/Sex: Treating RN: 1950/12/20 (69 y.o. Martyn Malay, Vaughan Basta Primary Care [place] Millon: [name] Other Clinician: Referring Aylen Stradford: Treating Daisia Slomski/Extender: Tommy Rainwater in Treatment: 35 Vital Signs Time Taken: 10:10 Temperature (F): 98 Height (in): 63 Pulse (bpm): 85 Source: Stated Respiratory Rate (breaths/min): 18 Weight (lbs): 185 Blood Pressure (mmHg): 147/75 Source: Stated Reference Range: 80 - 120 mg / dl Body Mass Index (BMI): 32.8 Electronic Signature(s) Signed: 09/06/2019 5:54:40 PM By: Baruch Gouty RN, BSN Entered By: Baruch Gouty on 09/06/2019 10:15:06

## 2019-09-07 NOTE — Progress Notes (Signed)
[name], [name] ([id number]) Visit Report for 09/06/2019 HPI Details Patient Name: Date of Service: [name], [name] 09/06/2019 10:00 A M Medical Record Number: [id number] Patient Account Number: [id number] Date of Birth/Sex: Treating RN: 1950-05-21 (69 y.o. [name] Primary Care Provider: Sandi [name] Other Clinician: Referring Provider: Treating Provider/Extender: [name] in Treatment: 35 History of Present Illness Location: open ulceration of the left gluteal area, left heel and right ankle for about 5 months. Quality: Patient reports No Pain. Severity: Patient states wound(s) are getting worse. Duration: Patient has had the wound for > 5 months prior to seeking treatment at the wound center Context: The wound occurred when the patient has been paraplegic for about 3 years. Modifying Factors: Wound improving due to current treatment. ssociated Signs and Symptoms: Patient reports having foul odor. A HPI Description: this 69 year old patient who [name] known to have hypertension, hypothyroidism, breast cancer, chronic pain syndrome, paraplegia was noted to have a left gluteal decubitus ulcer and was brought into the hospital. During the course of her hospitalization she was debrided in the operating room by Dr. Leland [name] and had all the wounds sharply debrided. The debridement was done for the left ischial wound, the left heel wound and the right ankle wound. Bone cultures were taken at that time but were negative but clinically she was treated for osteomyelitis because of the probing down to bone and open exposed bone. Home health has been giving her antibioticss which include vancomycin and Zosyn. The patient was a smoker until about 3 weeks ago and used to smoke about 10 cigarettes a day for a long while. 12/13/2014 - details of her operative note from 11/03/2014 were reviewed -- PROCEDURE: 1. Excisional debridement skin, subcutaneous, muscle left ischium 35 cm2 2.  Excisional debridement skin, subcutaneous tissue left heel 27 cm2 3. Excisional debridement right ankle skin, subcutaneous, bone 30 cm2 01/24/2015 -- she has some issues with her wheelchair cushion but other than that [name] doing very well and has received Podus boots for her feet. 02/14/2015 -- she was using her old offloading boots and this seemed to have caused her a new pressure ulcer on the left posterior heel near the superior part just below the Achilles tendon. 03/07/2015 -- she has a new ulceration just to the left of the midline on her sacral region more on the left buttock and this has been there for about a week. 08/22/2015 -- was recently admitted to hospital between May 5 and 08/13/2015, with sepsis and leukocytosis due to a UTI. she was treated for a sepsis complicating Escherichia coli UTI and kidney stones. She also had metabolic and careful up at the secondary to pyelonephritis. He received broad-spectrum antibiotics initially and then received Macrobid as per urology. She was sent home on nitrofurantoin. during her admission she had a CT scan which showed exposed left ischial tuberosity without evidence of osteolysis. 09/12/2015-- the patient [name] having some issues with her air mattress and would like to get a opinion from medical modalities. 10/10/2015 -- the issue with her air mattress has not yet been sorted out and the new problem seems to be a lot of odor from the wound VAC. 11/27/2015 -- the patient was admitted to the hospital between July 23 and 10/31/2015. Her problems were sepsis, osteomyelitis of the pelvic bone and acute pyelonephritis. CT of the abdomen and pelvis was consistent with a left-sided pyelonephritis with hydronephrosis and also just showed new sclerosis of the posterior portion of the left  anterior pubic ramus suggestive of periosteal reaction consistent with osteomyelitis. She was treated for the osteomyelitis with infectious disease consult recommending 6 weeks  of IV antibiotics including vancomycin and Rocephin and the antibiotics were to go on until 12/10/2015. He was seen by Dr. Iran [name] plastic surgery and Dr. Linus [name] of infectious disease. She had a suprapubic catheter placed during the admission. CT scan done on 10/28/2015 showed specifically -- New sclerosis of the posterior portion of the left inferior pubic ramus with aggressive periosteal reaction, consistent with osteomyelitis, with adjacent soft tissue gas compatible with previously described decubitus ulcer. 12/12/2015 -- she was recently seen by Dr. Linus [name], who noted good improvement and CRP and ESR compared to before and he has stopped her antibiotic as per plans to finish on September 4. The patient was encouraged to continue with wound care and consider hyperbaric oxygen therapy. Today she tells me that she has consented to undergo hyperbaric oxygen therapy and we can start the paperwork. 01/02/2016 -- her PCP had gained about 3 years but she still persists in having problems during hyperbaric oxygen therapy with some discomfort in the ears. 01/09/16; pressure area with underlying osteomyelitis in the left buttock. Wound bed itself has some slight amount of grayish surface slough however I do not think any debridement was necessary. There [name] no exposed bone soft tissue appears stable. [name] using a wound VAC 01/16/16; back for weekly wound review in conjunction with HBO. She has a deep wound over the left initial tuberosity previously treated with 6 weeks of IV antibiotics for osteomyelitis. Wound bed looks reasonably healthy although the base of this [name] still precariously close to bone. She has been using a wound VAC. 01/23/2016 -- she has completed her course of antibiotics and this week the only new thing [name] her right great toe nail was avulsed and she has got an open wound over the nailbed. 01/31/16 she has completed her course of antibiotics. Her right great toenail avulsed last week and  she's been using silver alginate for this as well. Still using a wound VAC to the substantial stage IV wound over the left ischial tuberosity 03/05/2016 -- the patient has had a opinion from the plastic surgery group at [facility] and details of this are not available yet but the patient's verbal report has been heard by me. Did not sound like there was any optimistic discussion regarding reconstruction and the net result would be to continue with the wound VAC application. I will await the official reports. Addendum: -- she was seen at [facility] by Dr. Tressa Busman. After a thorough review and from what I understand spending 45 minutes with the patient his assessment has been noted by me in detail and the management options were: 1. Continued pressure offloading and wound care versus operative procedures including wound excision 2. Soft tissue and bone sampling 3. If the wound gets larger wound closure would be done using a variety of plastic surgical techniques including but not limited to skin substitute, possible skin graft, local versus regional flaps, negative pressure dressing application. 4. He discussed with her details of flap surgery and the risks associated 5. He made a comment that since the patient was operated on by Dr. Leland [name] of [facility] plastic surgery unit in [place] the patient may continue to follow-up there for further evaluation for surgical flap closure in the future. 03/19/2016 -- the patient continues to be rather depressed and frustrated with her  lack of rapid progress in healing this wound especially because she thought after hyperbaric oxygen therapy the wound would heal extremely fast. She now understands that was not the implied benefit on wound care which was the recommendation for hyperbaric oxygen therapy. I have had a lengthy discussion with the patient and her husband regarding her options: 1. Continue  with collagen and wound VAC for the primary dressing and offloading and all supportive care. 2. See Dr. Iran [name] for possible placement of Acell or Integra in the OR. 3. get a second opinion from a wound care center and surrounding regions/counties 05/07/2016 -- Note from Dr. Celedonio Miyamoto, who noted that the patient has declined flap surgery. She has discussed application of A cell, and try a few applications to see how the wound progresses. [name] also recommended that we could apply products here in the wound center, like Oasis. during her preop workup it was found that her hemoglobin A1c was 11% and she has now been diagnosed as having diabetes mellitus and has been put on appropriate treatment by her PCP 05/28/2016 -- tells me her blood sugars have been doing well and she has an appointment to see her PCP in the next couple of weeks to check her hemoglobin A1c. Other than that she continues to do well. 06/25/2016 -- have not seen her back for the last month but she says her health has been about the same and she has an appointment to check the A1c next week 09/10/16 ---- was seen by Dr. Celedonio Miyamoto -- who applied Acell and saw her back in follow-up. She has recommended silver alginate to the wound every other day and cover with foam. If no significant drainage could transition to collagen every other day. She recommended discontinuing wound VAC. There were no plans to repeat application of Acell. The patient expressed that her husband could do the wound care as going to the Wound Ctr., would cost several $100 for each visit. 10/21/2016 -- her insurance company [name] getting her new mattress and [name] pleased about that. Other than that she has been doing dressings with PolyMem Silver and has been doing very well 02/18/2017 -- she has gone through several changes of her mattress and has not been pleased with any of them. The ventricles are still working on trying to fit her with the  appropriate low air-loss mattress. She has a new wound on the gluteal area which [name] clearly separated from the original wound. 03/25/17-[name] here in follow-up evaluation for her left ischialpressure ulcer. She remains unsatisfied with her pressure mattress. She admits to sitting multiple hours a day, in the bed. We have discussed offloading options. The wound does not appear infected. Nutrition does not appear to be a concern. Will follow-up in 4 weeks, if wound continues to be stalled may consider x-ray to evaluate for refractory osteomyelitis. 04/21/17; this [name] a patient that I don't know all that well. She has a chronic wound which at one point had underlying osteomyelitis in the left ischial tuberosity. This [name] a stage IV pressure ulcer. Over the last 3 months she has a stage II wound inferiorly to the original wound. The last time she was here her dressing was changed to silver collagen although the patient's husband who changes the dressing said that the collagen stuck to the wound and remove skin from the superficial area therefore he switched back to [place] 05/13/17; this [name] a patient we've been following for a left ischial tuberosity  wound which was stage IV at one point had underlying osteomyelitis. Over the last several months she's had a stage II wound just inferior and medial to the related to the wound. According to her husband he [name] using Endoform layer with collagen although this [name] not what I had last time. According to her husband they are using Elgie [place] with collagen although I don't quite know how that started. She was hospitalized from 1/20 through 04/30/16. This was related to a UTI. Her blood cultures were negative, urine culture showed multiple species. She did have a CT scan of the abdomen and pelvis which documented chronic osteomyelitis in the area of the wound inflammatory markers were unremarkable. She has had prior knowledge of osteomyelitis. It looks as though she  received IV antibiotics in 2017 and was treated with a course of hyperbaric oxygen. 05/28/17; the wound over the left ischial tuberosity [name] deeper today and abuts clearly on bone. Nursing intake reported drainage. I therefore culture of the wound. The more superficial area just below this looks about the same. They once again complained that there are mattress cover [name] not working although apparently [facility] [name] been noted to see this many times in the report [name] that the device [name] functional 06/18/17; the patient had a probing area on the left ischial tuberosity that was draining purulent fluid last time. This also clearly seemed to have open bone. Culture I did showed pansensitive pseudomonas including third generation cephalosporins. I treated this with cefdinir 300 twice a day for 10 days and things seem to have improved. She has a more superficial wound just underneath this area. Amazingly she has a new air mattress through [facility]. I think they gave this to her as a parking give. In any case this now works according to the patient may have something to do with why the areas are looking better. 07/09/17; the patient has a probing area in the left ischial tuberosity that still has some depth. However this [name] contracted in terms of the wound orifice although the depth [name] still roughly the same. There [name] no undermining. She also has the satellite wound which [name] more superficial. This appears to have a healthy surface we've been using silver collagen 08/06/17; the patient's wound [name] over the left ischial tuberosity and a satellite lesion just underneath this. The original wound was actually a deep stage 4 wound. We have made good progress in 2 months and there [name] no longer exposed bone here. 09/03/17; left ischial tuberosity actually appears to be quite healthy. I think we are making progress. No debridement [name] required. There [name] no surrounding erythema 10/01/17 I follow this patient  monthly for her left ischial tuberosity wound. There [name] 2 areas the original area and a satellite area. The satellite area looks a lot better there [name] no surrounding erythema. Her husband relates that he [name] having trouble maintaining the dressing. This has to do with the soft tissue around it. He states he puts the collagen in but he cannot make sure that it stays in even with the ABD pads and tape that he [name] been using 10/29/17; patient arrives with a better looking noon today. Some of the satellite lesions have closed. using Prisma 11/26/17; the patient has a large cone-shaped area with the tip of the Cone deep within her buttock soft tissue. The walls of the Cone are epithelialized however the base [name] still open. The area at the base of this looks moist we've  been using silver collagen. Will change to silver alginate 12/31/2017; the wound appears to have come in fairly nicely. Using silver alginate. There [name] no surrounding maceration or infection 01/28/18; there [name] still an open area here over the left initial tuberosity. Base of this however looks healthy. There [name] no surrounding infection 02/25/18; the area of its open [name] over the left ischial tuberosity. The base of this [name] where the wound [name]. This [name] a large inverted cone-shaped area with the wound at the tip. Dimensions of the wound at the tip are improved. There [name] a area of denuded skin about halfway towards the tip which her husband thinks may have happened today when he was bathing her. 04/20/17; the area [name] still open over the left initial tuberosity. This [name] an cone shaped wound with the tip where the wound remains area there [name] no evidence of infection, no erythema and no purulent drainage 5/12; very fragile patient who had a chronic stage IV wound over the left ischial tuberosity. This [name] now completely closed over although it [name] closed over with a divot and skin over bone at the base of this. Continued aggressive offloading will be  necessary. 12/30/2018 READMISSION This [name] a 69 year old woman with chronic paraplegia. I picked her up for her care from Dr. Con Memos in this clinic after he departed. She had a stage IV pressure wound over the left ischial tuberosity. She was treated twice for her underlying osteomyelitis and this I believe firstly in 2016 and again in 2017. There were some plans at some point for flap closure of this however she was discovered to have uncontrolled diabetes and I do not think this was ever accomplished. She ultimately healed over in this clinic and was discharged in May. She has a large cone-shaped indentation with the tip of this going towards the left ischial tuberosity. It [name] not an easy area to examine but at that time I thought all of this was epithelialized. Apparently there was a reopening here shortly after she left the clinic last time. She was admitted to hospital at the end of June for Klebsiella bacteremia felt to be secondary to UTI. A CT scan of the pelvis [name] listed below and there was initially some concern that she had underlying osteomyelitis although I believe she was seen by infectious disease and that was felt to be not the case: I do not see any new cultures or inflammatory markers IMPRESSION: 1. No CT evidence for acute intra-abdominal or pelvic abnormality. Large volume of stool throughout the colon. 2. Enlarged fatty liver with fat sparing near the gallbladder fossa 3. Cortical scarring right kidney. Bilateral intrarenal stones without hydronephrosis. Thick-walled urinary bladder decompressed by suprapubic catheter 4. Deep left decubitus ulcer with underlying left ischial changes suggesting osteomyelitis. Her husband has been using silver collagen in the wound. She has not been systemically unwell no fever chills eating and drinking well. They rigorously offload this wound only getting up in the wheelchair when [name] going to appointments the rest of the time [name] in  bed. 10/8; wound measures larger and she now has exposed bone. We have been using silver alginate 11/12 still using silver alginate. The patient saw Dr. Megan Salon of infectious disease. She was started on Augmentin 500 mg twice daily. [name] due to follow-up with Dr. Megan Salon I believe next week. Lab work Dr. Megan Salon requested showed a sedimentation rate of 28 and CRP of 20 although her CRP 1 year ago was 18.8.  Sedimentation rate 1 year ago was 11 basic metabolic panel showed a creatinine of 1.12 12/3; the patient followed up with Dr. Megan Salon yesterday. [name] still on Augmentin twice daily. This was directed by Dr. Megan Salon. The patient's inflammatory markers have improved which [name] gratifying. Her [name]-reactive protein was repeated yesterday and follow-up booked with infectious disease in January. In addition I have been getting secure text messages I think from palliative care through [facility] [employer]. I think they were hoping to provide services to the patient in her home. They could not get a hold of the primary physician and so they reached out to me on 2 separate occasions. 12/17; patient last saw Dr. Megan Salon on 12/2. [name] finishing up with Augmentin. Her [name]-reactive protein was 20 on 10/21, 10.1 on 11/19 and 17 on 12/2. The wound itself still has depth and undermining. We are using Santyl with the backing wet-to-dry 04/27/2019. The wound [name] gradually clearing up in terms of the surface although it [name] not filled in that much. Still abuts right against bone 2/4; patient with a deep pressure ulcer over the left ischial tuberosity. I thought she was going to follow-up with infectious disease to follow her inflammatory markers although the patient states that they stated that they did not need to see her unless we felt it was necessary. I will need to check their notes. In any case we ordered moistened silver collagen back with wet-to-dry to fill in the depth of the wound  although apparently [facility] sent silver alginate which they have been using since they were here the last time. [name] obviously not what we ordered. 2/25. Not much change in this wound it [name] over the left ischial tuberosity recurrent wound. We have been using silver collagen with backing wet-to-dry. I think the wound [name] about the same. There [name] still some tunneling from about 10-12 o'clock over the ischial tuberosity itself 3/11; pressure ulcer over the left ischial tuberosity. Since she was last here the wound VAC was started and apparently going quite well. We are able to get the home health company that accepts [place] healthcare which [name] in itself sometimes problematic. There [name] been improvements in the wound the tunneling seems to be better and [name] contracted nicely 4/8; 1 month follow-up. Since she was last here we have been using silver collagen under a wound VAC. Some minor contraction I think in wound volume. [name] cared for diligently by her husband including pressure relief, incontinence management, nutritional support etc. 6/1; this [name] almost a 82-monthfollow-up. [name] been using silver collagen under wound VAC. Circular area over the left ischial tuberosity. She has been using silver collagen under wound VAC Electronic Signature(s) Signed: 09/06/2019 5:52:05 PM By: RLinton HamMD Entered By: RLinton Hamon 09/06/2019 11:32:26 -------------------------------------------------------------------------------- Physical Exam Details Patient Name: Date of Service: [name], [name] 09/06/2019 10:00 AFoxworthRecord Number: [id number] Account Number: [id number] of Birth/Sex: Treating RN: [birth year](69y.o. FOrvan FalconerPrimary Care Provider: SSandi MariscalOther Clinician: Referring Provider: Treating Provider/Extender: RTommy Rainwaterin Treatment: 355Constitutional Patient [name] hypertensive.. Pulse regular and within target range for patient..Marland KitchenRespirations  regular, non-labored and within target range.. Temperature [name] normal and within the target range for the patient..Marland KitchenAppears in no distress. Notes Wound exam; left ischial tuberosity. The wound [name] circular. T 50% of the wound area [name] actually fully granulated however the lower part still has considerable op  depth. Some epithelialization. No evidence of surrounding infection [name] seen Electronic Signature(s) Signed: 09/06/2019 5:52:05 PM By: Linton Ham MD Entered By: Linton Ham on 09/06/2019 11:33:20 -------------------------------------------------------------------------------- Physician Orders Details Patient Name: Date of Service: [name], [name] 09/06/2019 10:00 Menan Record Number: [id number] Patient Account Number: [id number] Date of Birth/Sex: Treating RN: October 07, 1950 (69 y.o. [name] Primary Care Provider: Sandi [name] Other Clinician: Referring Provider: Treating Provider/Extender: [name] in Treatment: 10 Verbal / Phone Orders: No Diagnosis Coding ICD-10 Coding Code Description [phone] Pressure ulcer of left buttock, stage 4 G82.20 Paraplegia, unspecified E11.622 Type 2 diabetes mellitus with other skin ulcer M86.68 Other chronic osteomyelitis, other site Follow-up Appointments Return appointment in 3 weeks. - Thursday Dressing Change Frequency Change dressing three times week. - wound vac Skin Barriers/Peri-Wound Care Skin Prep - to periwound. Wound Cleansing Wound #12 Left Ischial Tuberosity May shower and wash wound with soap and water. - or wound cleanser Primary Wound Dressing Wound #12 Left Ischial Tuberosity Collagen - apply collagen under wound vac. collagen with wet to dry in clinic only. wound vac to be reapplied by home health on Friday. Secondary Dressing Wound #12 Left Ischial Tuberosity Foam Border - in clinic. Negative Presssure Wound Therapy Wound #12 Left Ischial Tuberosity Wound Vac to wound continuously  at 183m/hg pressure - ***Please apply drape under black foam when bridging the wound vac.*** Black Foam - ***Please apply drape under black foam when bridging the wound vac.*** Off-Loading Low air-loss mattress (Group 2) - continue to use Gel wheelchair cushion - continue to use specialty wheelchair cushion. Turn and reposition every 2 hours [facility] care. - [facility] change wound vac dressing three times a week. ***Please apply drape under black foam when bridging the wound vac.*** Electronic Signature(s) Signed: 09/06/2019 5:52:05 PM By: RLinton HamMD Signed: 09/07/2019 4:36:43 PM By: ECarlene CoriaRN Entered By: ECarlene Coriaon 09/06/2019 10:46:30 -------------------------------------------------------------------------------- Problem List Details Patient Name: Date of Service: [name], [name] 09/06/2019 10:00 ASpring GroveRecord Number: [id number] Account Number: [id number] of Birth/Sex: Treating RN: [phone](69y.o. FOrvan FalconerPrimary Care Provider: SSandi MariscalOther Clinician: Referring Provider: Treating Provider/Extender: RTommy Rainwaterin Treatment: 349Active Problems ICD-10 Encounter Code Description Active Date MDM Diagnosis 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, stage 4 12/30/2018 No Yes G82.20 Paraplegia, unspecified 12/30/2018 No Yes E11.622 Type 2 diabetes mellitus with other skin ulcer 12/30/2018 No Yes M86.68 Other chronic osteomyelitis, other site 02/17/2019 No Yes Inactive Problems Resolved Problems Electronic Signature(s) Signed: 09/06/2019 5:52:05 PM By: RLinton HamMD Entered By: RLinton Hamon 09/06/2019 11:31:12 -------------------------------------------------------------------------------- Progress Note Details Patient Name: Date of Service: CBunnie Domino [name] 09/06/2019 10:00 ALaneRecord Number: [id number] Account Number:  [id number] of Birth/Sex: Treating RN: [phone](69y.o. FOrvan FalconerPrimary Care Provider: SSandi MariscalOther Clinician: Referring Provider: Treating Provider/Extender: RTommy Rainwaterin Treatment: 35 Subjective History of Present Illness (HPI) The following HPI elements were documented for the patient's wound: Location: open ulceration of the left gluteal area, left heel and right ankle for about 5 months. Quality: Patient reports No Pain. Severity: Patient states wound(s) are getting worse. Duration: Patient has had the wound for > 5 months prior to seeking treatment at the wound center Context: The wound occurred when the patient has been paraplegic for about 3 years. Modifying Factors: Wound  improving due to current treatment. Associated Signs and Symptoms: Patient reports having foul odor. this 69 year old patient who [name] known to have hypertension, hypothyroidism, breast cancer, chronic pain syndrome, paraplegia was noted to have a left gluteal decubitus ulcer and was brought into the hospital. During the course of her hospitalization she was debrided in the operating room by Dr. Leland [name] and had all the wounds sharply debrided. The debridement was done for the left ischial wound, the left heel wound and the right ankle wound. Bone cultures were taken at that time but were negative but clinically she was treated for osteomyelitis because of the probing down to bone and open exposed bone. Home health has been giving her antibioticss which include vancomycin and Zosyn. The patient was a smoker until about 3 weeks ago and used to smoke about 10 cigarettes a day for a long while. 12/13/2014 - details of her operative note from 11/03/2014 were reviewed -- PROCEDURE: 1. Excisional debridement skin, subcutaneous, muscle left ischium 35 cm2 2. Excisional debridement skin, subcutaneous tissue left heel 27 cm2 3. Excisional debridement right ankle skin, subcutaneous, bone 30  cm2 01/24/2015 -- she has some issues with her wheelchair cushion but other than that [name] doing very well and has received Podus boots for her feet. 02/14/2015 -- she was using her old offloading boots and this seemed to have caused her a new pressure ulcer on the left posterior heel near the superior part just below the Achilles tendon. 03/07/2015 -- she has a new ulceration just to the left of the midline on her sacral region more on the left buttock and this has been there for about a week. 08/22/2015 -- was recently admitted to hospital between May 5 and 08/13/2015, with sepsis and leukocytosis due to a UTI. she was treated for a sepsis complicating Escherichia coli UTI and kidney stones. She also had metabolic and careful up at the secondary to pyelonephritis. He received broad-spectrum antibiotics initially and then received Macrobid as per urology. She was sent home on nitrofurantoin. during her admission she had a CT scan which showed exposed left ischial tuberosity without evidence of osteolysis. 09/12/2015-- the patient [name] having some issues with her air mattress and would like to get a opinion from medical modalities. 10/10/2015 -- the issue with her air mattress has not yet been sorted out and the new problem seems to be a lot of odor from the wound VAC. 11/27/2015 -- the patient was admitted to the hospital between July 23 and 10/31/2015. Her problems were sepsis, osteomyelitis of the pelvic bone and acute pyelonephritis. CT of the abdomen and pelvis was consistent with a left-sided pyelonephritis with hydronephrosis and also just showed new sclerosis of the posterior portion of the left anterior pubic ramus suggestive of periosteal reaction consistent with osteomyelitis. She was treated for the osteomyelitis with infectious disease consult recommending 6 weeks of IV antibiotics including vancomycin and Rocephin and the antibiotics were to go on until 12/10/2015. He was seen by Dr.  Iran [name] plastic surgery and Dr. Linus [name] of infectious disease. She had a suprapubic catheter placed during the admission. CT scan done on 10/28/2015 showed specifically -- New sclerosis of the posterior portion of the left inferior pubic ramus with aggressive periosteal reaction, consistent with osteomyelitis, with adjacent soft tissue gas compatible with previously described decubitus ulcer. 12/12/2015 -- she was recently seen by Dr. Linus [name], who noted good improvement and CRP and ESR compared to before and he has stopped her antibiotic as per plans to  finish on September 4. The patient was encouraged to continue with wound care and consider hyperbaric oxygen therapy. Today she tells me that she has consented to undergo hyperbaric oxygen therapy and we can start the paperwork. 01/02/2016 -- her PCP had gained about 3 years but she still persists in having problems during hyperbaric oxygen therapy with some discomfort in the ears. 01/09/16; pressure area with underlying osteomyelitis in the left buttock. Wound bed itself has some slight amount of grayish surface slough however I do not think any debridement was necessary. There [name] no exposed bone soft tissue appears stable. [name] using a wound VAC 01/16/16; back for weekly wound review in conjunction with HBO. She has a deep wound over the left initial tuberosity previously treated with 6 weeks of IV antibiotics for osteomyelitis. Wound bed looks reasonably healthy although the base of this [name] still precariously close to bone. She has been using a wound VAC. 01/23/2016 -- she has completed her course of antibiotics and this week the only new thing [name] her right great toe nail was avulsed and she has got an open wound over the nailbed. 01/31/16 she has completed her course of antibiotics. Her right great toenail avulsed last week and she's been using silver alginate for this as well. Still using a wound VAC to the substantial stage IV wound over the left  ischial tuberosity 03/05/2016 -- the patient has had a opinion from the plastic surgery group at [facility] and details of this are not available yet but the patient's verbal report has been heard by me. Did not sound like there was any optimistic discussion regarding reconstruction and the net result would be to continue with the wound VAC application. I will await the official reports. Addendum: -- she was seen at [facility] surgery service by Dr. Tressa Busman. After a thorough review and from what I understand spending 45 minutes with the patient his assessment has been noted by me in detail and the management options were: 1. Continued pressure offloading and wound care versus operative procedures including wound excision 2. Soft tissue and bone sampling 3. If the wound gets larger wound closure would be done using a variety of plastic surgical techniques including but not limited to skin substitute, possible skin graft, local versus regional flaps, negative pressure dressing application. 4. He discussed with her details of flap surgery and the risks associated 5. He made a comment that since the patient was operated on by Dr. Leland [name] of [facility] plastic surgery unit in [place] the patient may continue to follow-up there for further evaluation for surgical flap closure in the future. 03/19/2016 -- the patient continues to be rather depressed and frustrated with her lack of rapid progress in healing this wound especially because she thought after hyperbaric oxygen therapy the wound would heal extremely fast. She now understands that was not the implied benefit on wound care which was the recommendation for hyperbaric oxygen therapy. I have had a lengthy discussion with the patient and her husband regarding her options: 1. Continue with collagen and wound VAC for the primary dressing and offloading and all supportive care. 2. See Dr. Iran [name] for possible  placement of Acell or Integra in the OR. 3. get a second opinion from a wound care center and surrounding regions/counties 05/07/2016 -- Note from Dr. Celedonio Miyamoto, who noted that the patient has declined flap surgery. She has discussed application of A cell, and try a few applications to  see how the wound progresses. [name] also recommended that we could apply products here in the wound center, like Oasis. during her preop workup it was found that her hemoglobin A1c was 11% and she has now been diagnosed as having diabetes mellitus and has been put on appropriate treatment by her PCP 05/28/2016 -- tells me her blood sugars have been doing well and she has an appointment to see her PCP in the next couple of weeks to check her hemoglobin A1c. Other than that she continues to do well. 06/25/2016 -- have not seen her back for the last month but she says her health has been about the same and she has an appointment to check the A1c next week 09/10/16 ---- was seen by Dr. Celedonio Miyamoto -- who applied Acell and saw her back in follow-up. She has recommended silver alginate to the wound every other day and cover with foam. If no significant drainage could transition to collagen every other day. She recommended discontinuing wound VAC. There were no plans to repeat application of Acell. The patient expressed that her husband could do the wound care as going to the Wound Ctr., would cost several $100 for each visit. 10/21/2016 -- her insurance company [name] getting her new mattress and [name] pleased about that. Other than that she has been doing dressings with PolyMem Silver and has been doing very well 02/18/2017 -- she has gone through several changes of her mattress and has not been pleased with any of them. The ventricles are still working on trying to fit her with the appropriate low air-loss mattress. She has a new wound on the gluteal area which [name] clearly separated from the original  wound. 03/25/17-[name] here in follow-up evaluation for her left ischialpressure ulcer. She remains unsatisfied with her pressure mattress. She admits to sitting multiple hours a day, in the bed. We have discussed offloading options. The wound does not appear infected. Nutrition does not appear to be a concern. Will follow-up in 4 weeks, if wound continues to be stalled may consider x-ray to evaluate for refractory osteomyelitis. 04/21/17; this [name] a patient that I don't know all that well. She has a chronic wound which at one point had underlying osteomyelitis in the left ischial tuberosity. This [name] a stage IV pressure ulcer. Over the last 3 months she has a stage II wound inferiorly to the original wound. The last time she was here her dressing was changed to silver collagen although the patient's husband who changes the dressing said that the collagen stuck to the wound and remove skin from the superficial area therefore he switched back to [place] 05/13/17; this [name] a patient we've been following for a left ischial tuberosity wound which was stage IV at one point had underlying osteomyelitis. Over the last several months she's had a stage II wound just inferior and medial to the related to the wound. According to her husband he [name] using Endoform layer with collagen although this [name] not what I had last time. According to her husband they are using Elgie Congo with collagen although I don't quite know how that started. She was hospitalized from 1/20 through 04/30/16. This was related to a UTI. Her blood cultures were negative, urine culture showed multiple species. She did have a CT scan of the abdomen and pelvis which documented chronic osteomyelitis in the area of the wound inflammatory markers were unremarkable. She has had prior knowledge of osteomyelitis. It looks as though she  received IV antibiotics in 2017 and was treated with a course of hyperbaric oxygen. 05/28/17; the wound over the left  ischial tuberosity [name] deeper today and abuts clearly on bone. Nursing intake reported drainage. I therefore culture of the wound. The more superficial area just below this looks about the same. They once again complained that there are mattress cover [name] not working although apparently [facility] [name] been noted to see this many times in the report [name] that the device [name] functional 06/18/17; the patient had a probing area on the left ischial tuberosity that was draining purulent fluid last time. This also clearly seemed to have open bone. Culture I did showed pansensitive pseudomonas including third generation cephalosporins. I treated this with cefdinir 300 twice a day for 10 days and things seem to have improved. She has a more superficial wound just underneath this area. Amazingly she has a new air mattress through [facility]. I think they gave this to her as a parking give. In any case this now works according to the patient may have something to do with why the areas are looking better. 07/09/17; the patient has a probing area in the left ischial tuberosity that still has some depth. However this [name] contracted in terms of the wound orifice although the depth [name] still roughly the same. There [name] no undermining. ooShe also has the satellite wound which [name] more superficial. This appears to have a healthy surface we've been using silver collagen 08/06/17; the patient's wound [name] over the left ischial tuberosity and a satellite lesion just underneath this. The original wound was actually a deep stage 4 wound. We have made good progress in 2 months and there [name] no longer exposed bone here. 09/03/17; left ischial tuberosity actually appears to be quite healthy. I think we are making progress. No debridement [name] required. There [name] no surrounding erythema 10/01/17 I follow this patient monthly for her left ischial tuberosity wound. There [name] 2 areas the original area and a satellite area. The satellite  area looks a lot better there [name] no surrounding erythema. Her husband relates that he [name] having trouble maintaining the dressing. This has to do with the soft tissue around it. He states he puts the collagen in but he cannot make sure that it stays in even with the ABD pads and tape that he [name] been using 10/29/17; patient arrives with a better looking noon today. Some of the satellite lesions have closed. using Prisma 11/26/17; the patient has a large cone-shaped area with the tip of the Cone deep within her buttock soft tissue. The walls of the Cone are epithelialized however the base [name] still open. The area at the base of this looks moist we've been using silver collagen. Will change to silver alginate 12/31/2017; the wound appears to have come in fairly nicely. Using silver alginate. There [name] no surrounding maceration or infection 01/28/18; there [name] still an open area here over the left initial tuberosity. Base of this however looks healthy. There [name] no surrounding infection 02/25/18; the area of its open [name] over the left ischial tuberosity. The base of this [name] where the wound [name]. This [name] a large inverted cone-shaped area with the wound at the tip. Dimensions of the wound at the tip are improved. There [name] a area of denuded skin about halfway towards the tip which her husband thinks may have happened today when he was bathing her. 04/20/17; the area [name] still open over the left initial  tuberosity. This [name] an cone shaped wound with the tip where the wound remains area there [name] no evidence of infection, no erythema and no purulent drainage 5/12; very fragile patient who had a chronic stage IV wound over the left ischial tuberosity. This [name] now completely closed over although it [name] closed over with a divot and skin over bone at the base of this. Continued aggressive offloading will be necessary. 12/30/2018 READMISSION This [name] a 69 year old woman with chronic paraplegia. I picked her up for her care from  Dr. Con Memos in this clinic after he departed. She had a stage IV pressure wound over the left ischial tuberosity. She was treated twice for her underlying osteomyelitis and this I believe firstly in 2016 and again in 2017. There were some plans at some point for flap closure of this however she was discovered to have uncontrolled diabetes and I do not think this was ever accomplished. She ultimately healed over in this clinic and was discharged in May. She has a large cone-shaped indentation with the tip of this going towards the left ischial tuberosity. It [name] not an easy area to examine but at that time I thought all of this was epithelialized. Apparently there was a reopening here shortly after she left the clinic last time. She was admitted to hospital at the end of June for Klebsiella bacteremia felt to be secondary to UTI. A CT scan of the pelvis [name] listed below and there was initially some concern that she had underlying osteomyelitis although I believe she was seen by infectious disease and that was felt to be not the case: I do not see any new cultures or inflammatory markers IMPRESSION: 1. No CT evidence for acute intra-abdominal or pelvic abnormality. Large volume of stool throughout the colon. 2. Enlarged fatty liver with fat sparing near the gallbladder fossa 3. Cortical scarring right kidney. Bilateral intrarenal stones without hydronephrosis. Thick-walled urinary bladder decompressed by suprapubic catheter 4. Deep left decubitus ulcer with underlying left ischial changes suggesting osteomyelitis. Her husband has been using silver collagen in the wound. She has not been systemically unwell no fever chills eating and drinking well. They rigorously offload this wound only getting up in the wheelchair when [name] going to appointments the rest of the time [name] in bed. 10/8; wound measures larger and she now has exposed bone. We have been using silver alginate 11/12 still using silver  alginate. The patient saw Dr. Megan Salon of infectious disease. She was started on Augmentin 500 mg twice daily. [name] due to follow-up with Dr. Megan Salon I believe next week. Lab work Dr. Megan Salon requested showed a sedimentation rate of 28 and CRP of 20 although her CRP 1 year ago was 18.8. Sedimentation rate 1 year ago was 11 basic metabolic panel showed a creatinine of 1.12 12/3; the patient followed up with Dr. Megan Salon yesterday. [name] still on Augmentin twice daily. This was directed by Dr. Megan Salon. The patient's inflammatory markers have improved which [name] gratifying. Her [name]-reactive protein was repeated yesterday and follow-up booked with infectious disease in January. In addition I have been getting secure text messages I think from palliative care [facility] [employer]. I think they were hoping to provide services to the patient in her home. They could not get a hold of the primary physician and so they reached out to me on 2 separate occasions. 12/17; patient last saw Dr. Megan Salon on 12/2. [name] finishing up with Augmentin. Her  [name]-reactive protein was 20 on 10/21, 10.1 on 11/19 and 17 on 12/2. The wound itself still has depth and undermining. We are using Santyl with the backing wet-to-dry 04/27/2019. The wound [name] gradually clearing up in terms of the surface although it [name] not filled in that much. Still abuts right against bone 2/4; patient with a deep pressure ulcer over the left ischial tuberosity. I thought she was going to follow-up with infectious disease to follow her inflammatory markers although the patient states that they stated that they did not need to see her unless we felt it was necessary. I will need to check their notes. In any case we ordered moistened silver collagen back with wet-to-dry to fill in the depth of the wound although apparently [facility] sent silver alginate which they have been using since they were here the last time. [name] obviously not  what we ordered. 2/25. Not much change in this wound it [name] over the left ischial tuberosity recurrent wound. We have been using silver collagen with backing wet-to-dry. I think the wound [name] about the same. There [name] still some tunneling from about 10-12 o'clock over the ischial tuberosity itself 3/11; pressure ulcer over the left ischial tuberosity. Since she was last here the wound VAC was started and apparently going quite well. We are able to get the home health company that accepts [place] [employer] which [name] in itself sometimes problematic. There [name] been improvements in the wound the tunneling seems to be better and [name] contracted nicely 4/8; 1 month follow-up. Since she was last here we have been using silver collagen under a wound VAC. Some minor contraction I think in wound volume. [name] cared for diligently by her husband including pressure relief, incontinence management, nutritional support etc. 6/1; this [name] almost a 70-monthfollow-up. [name] been using silver collagen under wound VAC. Circular area over the left ischial tuberosity. She has been using silver collagen under wound VAC Objective Constitutional Patient [name] hypertensive.. Pulse regular and within target range for patient..Marland KitchenRespirations regular, non-labored and within target range.. Temperature [name] normal and within the target range for the patient..Marland KitchenAppears in no distress. Vitals Time Taken: 10:10 AM, Height: 63 in, Source: Stated, Weight: 185 lbs, Source: Stated, BMI: 32.8, Temperature: 98 F, Pulse: 85 bpm, Respiratory Rate: 18 breaths/min, Blood Pressure: 147/75 mmHg. General Notes: Wound exam; left ischial tuberosity. The wound [name] circular. T 50% of the wound area [name] actually fully granulated however the lower part still op has considerable depth. Some epithelialization. No evidence of surrounding infection [name] seen Integumentary (Hair, Skin) Wound #12 status [name] Open. Original cause of wound was Pressure Injury. The wound  [name] located on the Left Ischial Tuberosity. The wound measures 2.7cm length x 2.1cm width x 2.3cm depth; 4.453cm^2 area and 10.242cm^3 volume. There [name] Fat Layer (Subcutaneous Tissue) Exposed exposed. There [name] no tunneling or undermining noted. There [name] a medium amount of serosanguineous drainage noted. The wound margin [name] distinct with the outline attached to the wound base. There [name] large (67-100%) red, pink granulation within the wound bed. There [name] no necrotic tissue within the wound bed. Assessment Active Problems ICD-10 Pressure ulcer of left buttock, stage 4 Paraplegia, unspecified Type 2 diabetes mellitus with other skin ulcer Other chronic osteomyelitis, other site Plan Follow-up Appointments: Return appointment in 3 weeks. - Thursday Dressing Change Frequency: Change dressing three times week. - wound vac Skin Barriers/Peri-Wound Care: Skin Prep - to periwound. Wound Cleansing: Wound #12 Left Ischial Tuberosity: May  shower and wash wound with soap and water. - or wound cleanser Primary Wound Dressing: Wound #12 Left Ischial Tuberosity: Collagen - apply collagen under wound vac. collagen with wet to dry in clinic only. wound vac to be reapplied by home health on Friday. Secondary Dressing: Wound #12 Left Ischial Tuberosity: Foam Border - in clinic. Negative Presssure Wound Therapy: Wound #12 Left Ischial Tuberosity: Wound Vac to wound continuously at 15m/hg pressure - ***Please apply drape under black foam when bridging the wound vac.*** Black Foam - ***Please apply drape under black foam when bridging the wound vac.*** Off-Loading: Low air-loss mattress (Group 2) - continue to use Gel wheelchair cushion - continue to use specialty wheelchair cushion. Turn and reposition every 2 hours [facility] - [facility] change wound vac dressing three times a week. ***Please apply drape under black foam when bridging  the wound vac.*** 1. I continued with silver collagen in the wound VAC. 2. I am still trying to get the lower 50% of the wound to granulate. There [name] still depth here but even in this area it does not look particularly ominous. 3. Our intake nurse reported an odor with fecal material however I did not see any evidence of infection Electronic Signature(s) Signed: 09/06/2019 5:52:05 PM By: RLinton HamMD Entered By: RLinton Hamon 09/06/2019 11:34:21 -------------------------------------------------------------------------------- SuperBill Details Patient Name: Date of Service: [place]6/04[place]2019 Medical Record Number: [id number] Account Number: [id number] of Birth/Sex: Treating RN: [birth year]((69y.o. FOrvan FalconerPrimary Care Provider: SSandi MariscalOther Clinician: Referring Provider: Treating Provider/Extender: RTommy Rainwaterin Treatment: 35 Diagnosis Coding ICD-10 Codes Code Description [id number] ulcer of left buttock, stage 4 G82.20 Paraplegia, unspecified E11.622 Type 2 diabetes mellitus with other skin ulcer M86.68 Other chronic osteomyelitis, other site Physician Procedures : CPT4 Code Description Modifier [id number] PHYS LEVEL 3 - EST PT ICD-10 Diagnosis Description 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, stage 4 G82.20 Paraplegia, unspecified Quantity: 1 Electronic Signature(s) Signed: 09/06/2019 5:52:05 PM By: RLinton HamMD Entered By: RLinton Hamon 09/06/2019 11:34:41

## 2019-09-22 ENCOUNTER — Encounter (HOSPITAL_BASED_OUTPATIENT_CLINIC_OR_DEPARTMENT_OTHER): Payer: Medicare Other | Admitting: Internal Medicine

## 2019-10-13 ENCOUNTER — Encounter (HOSPITAL_BASED_OUTPATIENT_CLINIC_OR_DEPARTMENT_OTHER): Payer: Medicare Other | Attending: Internal Medicine | Admitting: Internal Medicine

## 2019-10-13 DIAGNOSIS — Z853 Personal history of malignant neoplasm of breast: Secondary | ICD-10-CM | POA: Diagnosis not present

## 2019-10-13 DIAGNOSIS — L89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, stage 4: Secondary | ICD-10-CM | POA: Insufficient documentation

## 2019-10-13 DIAGNOSIS — E119 Type 2 diabetes mellitus without complications: Secondary | ICD-10-CM | POA: Diagnosis not present

## 2019-10-13 DIAGNOSIS — G8220 Paraplegia, unspecified: Secondary | ICD-10-CM | POA: Diagnosis not present

## 2019-10-13 DIAGNOSIS — I10 Essential (primary) hypertension: Secondary | ICD-10-CM | POA: Diagnosis not present

## 2019-10-18 NOTE — Progress Notes (Addendum)
JODA, BRAATZ ([id number]) Visit Report for 10/13/2019 Arrival Information Details Patient Name: Date of Service: [name], [name] 10/13/2019 9:00 [place] Record Number: [id number] Patient Account Number: [id number] Date of Birth/Sex: Treating RN: [birth year] (69 y.o. [name] Primary Care [name]: [name] Other Clinician: Referring [name]: Treating [name]/Extender: [name] in Treatment: 24 Visit Information History Since Last Visit Added or deleted any medications: No Patient Arrived: Wheel Chair Any [name] allergies or adverse reactions: No Arrival Time: 09:19 Had a fall or experienced change in No Accompanied By: husband activities of daily living that may affect Transfer Assistance: Manual risk of falls: Patient Identification Verified: Yes Signs or symptoms of abuse/neglect since last visito No Secondary Verification Process Completed: Yes Hospitalized since last visit: No Patient Requires Transmission-Based Precautions: No Implantable device outside of the clinic excluding No Patient Has Alerts: No cellular tissue based products placed in the center since last visit: Has Dressing in Place as Prescribed: Yes Pain Present Now: No Electronic Signature(s) Signed: 10/13/2019 5:25:24 PM By: Kela Millin Entered By: Kela Millin on 10/13/2019 09:19:44 -------------------------------------------------------------------------------- Lower Extremity Assessment Details Patient Name: Date of Service: [name], [name] [place] 10/13/2019 9:00 [place] Record Number: [id number] Patient Account Number: [id number] Date of Birth/Sex: Treating RN: [birth year] (69 y.o. [name] Primary Care [name]: [name] Other Clinician: Referring [name]: Treating [name]/Extender: [name] in Treatment: 41 Electronic Signature(s) Signed: 10/13/2019 5:25:24 PM By: Kela Millin Entered By: Kela Millin on  10/13/2019 09:20:42 -------------------------------------------------------------------------------- Multi Wound Chart Details Patient Name: Date of Service: [name], [name] 10/13/2019 9:00 A M Medical Record Number: [id number] Patient Account Number: [id number] Date of Birth/Sex: Treating RN: 1951-01-13 (69 y.o. Benjamine Sprague, Briant Cedar Primary Care [name]: [name] Other Clinician: Referring Jermie Hippe: Treating [name]/Extender: [name] in Treatment: 41 Vital Signs Height(in): 63 Pulse(bpm): 101 Weight(lbs): 185 Blood Pressure(mmHg): 162/83 Body Mass Index(BMI): 33 Temperature(F): 97.9 Respiratory Rate(breaths/min): 18 Photos: [12:No Photos Left Ischial Tuberosity] [N/A:N/A N/A] Wound Location: [12:Pressure Injury] [N/A:N/A] Wounding Event: [12:Pressure Ulcer] [N/A:N/A] Primary Etiology: [12:Anemia, Hypertension, Type II] [N/A:N/A] Comorbid History: [12:Diabetes, Osteoarthritis, Dementia, Paraplegia, Received Radiation 09/06/2018] [N/A:N/A] Date Acquired: [12:41] [N/A:N/A] Weeks of Treatment: [12:Open] [N/A:N/A] Wound Status: [12:2.5x1.6x2.2] [N/A:N/A] Measurements L x W x D (cm) [12:3.142] [N/A:N/A] A (cm) : rea [12:6.912] [N/A:N/A] Volume (cm) : [12:-1231.40%] [N/A:N/A] % Reduction in A [12:rea: -3160.40%] [N/A:N/A] % Reduction in Volume: [12:Category/Stage IV] [N/A:N/A] Classification: [12:Medium] [N/A:N/A] Exudate A mount: [12:Serosanguineous] [N/A:N/A] Exudate Type: [12:red, brown] [N/A:N/A] Exudate Color: [12:Well defined, not attached] [N/A:N/A] Wound Margin: [12:Large (67-100%)] [N/A:N/A] Granulation A mount: [12:Red, Pink] [N/A:N/A] Granulation Quality: [12:None Present (0%)] [N/A:N/A] Necrotic A mount: [12:Fat Layer (Subcutaneous Tissue)] [N/A:N/A] Exposed Structures: [12:Exposed: Yes Fascia: No Tendon: No Muscle: No Joint: No Bone: No Small (1-33%)] [N/A:N/A] Epithelialization: [12:Debridement - Excisional]  [N/A:N/A] Debridement: Pre-procedure Verification/Time Out 09:48 [N/A:N/A] Taken: [12:Subcutaneous] [N/A:N/A] Tissue Debrided: [12:Skin/Subcutaneous Tissue] [N/A:N/A] Level: [12:4] [N/A:N/A] Debridement A (sq cm): [12:rea Curette] [N/A:N/A] Instrument: [12:Minimum] [N/A:N/A] Bleeding: [12:Pressure] [N/A:N/A] Hemostasis A chieved: [12:0] [N/A:N/A] Procedural Pain: [12:0] [N/A:N/A] Post Procedural Pain: [12:Procedure was tolerated well] [N/A:N/A] Debridement Treatment Response: [12:2.5x1.6x2.2] [N/A:N/A] Post Debridement Measurements L x W x D (cm) [12:6.912] [N/A:N/A] Post Debridement Volume: (cm) [12:Category/Stage IV] [N/A:N/A] Post Debridement Stage: [12:Debridement] [N/A:N/A] Treatment Notes Electronic Signature(s) Signed: 10/13/2019 5:28:43 PM By: [name] Ham MD Signed: 10/17/2019 5:04:17 PM By: [name] Hurst RN, BSN Entered By: [name] on 10/13/2019 09:54:08 -------------------------------------------------------------------------------- [place] Details Patient Name: Date of  Service: [name] 10/13/2019 9:00 A M Medical Record Number: [id number] Patient Account Number: [id number] Date of Birth/Sex: Treating RN: Jun 06, 1950 (69 y.o. [name] Primary Care Annaliah Rivenbark: Other Clinician: Sandi [name] Referring [name]: Treating [name]/Extender: [name] in Treatment: 41 Active Inactive Wound/Skin Impairment Nursing Diagnoses: Knowledge deficit related to ulceration/compromised skin integrity Goals: Patient/caregiver will verbalize understanding of skin care regimen Date Initiated: 12/30/2018 Target Resolution Date: 11/11/2019 Goal Status: Active Interventions: Assess patient/caregiver ability to obtain necessary supplies Assess patient/caregiver ability to perform ulcer/skin care regimen upon admission and as needed Assess ulceration(s) every visit Provide education on ulcer and skin care Treatment  Activities: Skin care regimen initiated : 12/30/2018 Topical wound management initiated : 12/30/2018 Notes: Electronic Signature(s) Signed: 10/17/2019 5:04:17 PM By: [name] Hurst RN, BSN Entered By: [name] on 10/13/2019 09:46:22 -------------------------------------------------------------------------------- Pain Assessment Details Patient Name: Date of Service: [name], Shalyn 10/13/2019 9:00 [place] Record Number: [id number] Patient Account Number: [id number] Date of Birth/Sex: Treating RN: May 19, 1950 (69 y.o. [name] Primary Care Danira Nylander: [name] Other Clinician: Referring Ordean Fouts: Treating Dickie Labarre/Extender: [name] in Treatment: 29 Active Problems Location of Pain Severity and Description of Pain Patient Has Paino No Site Locations Pain Management and Medication Current Pain Management: Electronic Signature(s) Signed: 10/13/2019 5:25:24 PM By: Kela Millin Entered By: Kela Millin on 10/13/2019 09:20:28 -------------------------------------------------------------------------------- Patient/Caregiver Education Details Patient Name: Date of Service: [name], [name] 7/8/2021andnbsp9:00 [place] Record Number: [id number] Patient Account Number: [id number] Date of Birth/Gender: Treating RN: 1950-10-27 (69 y.o. [name] Primary Care Physician: [name] Other Clinician: Referring Physician: Treating Physician/Extender: [name] in Treatment: 30 Education Assessment Education Provided To: Patient Education Topics Provided Wound/Skin Impairment: Methods: Explain/Verbal Responses: State content correctly [employer]) Signed: 10/17/2019 5:04:17 PM By: [name] Hurst RN, BSN Entered By: [name] on 10/13/2019 09:46:34 -------------------------------------------------------------------------------- Wound Assessment Details Patient Name: Date of Service: [facility]  [name], [name] 10/13/2019 9:00 A M Medical Record Number: [id number] Patient Account Number: [id number] Date of Birth/Sex: Treating RN: Sep 09, 1950 (69 y.o. [name] Primary Care Lynnie Koehler: [name] Other Clinician: Referring Lacie Landry: Treating Abyan Cadman/Extender: [name] in Treatment: 41 Wound Status Wound Number: 12 Primary Pressure Ulcer Etiology: Wound Location: Left Ischial Tuberosity Wound Open Wounding Event: Pressure Injury Status: Date Acquired: 09/06/2018 Comorbid Anemia, Hypertension, Type II Diabetes, Osteoarthritis, Weeks Of Treatment: 41 History: Dementia, Paraplegia, Received Radiation Clustered Wound: No Photos Photo Uploaded By: Mikeal Hawthorne on 10/13/2019 16:16:03 Wound Measurements Length: (cm) 2.5 Width: (cm) 1.6 Depth: (cm) 2.2 Area: (cm) 3.142 Volume: (cm) 6.912 % Reduction in Area: -1231.4% % Reduction in Volume: -3160.4% Epithelialization: Small (1-33%) Tunneling: No Undermining: No Wound Description Classification: Category/Stage IV Wound Margin: Well defined, not attached Exudate Amount: Medium Exudate Type: Serosanguineous Exudate Color: red, brown Foul Odor After Cleansing: No Slough/Fibrino No Wound Bed Granulation Amount: Large (67-100%) Exposed Structure Granulation Quality: Red, Pink Fascia Exposed: No Necrotic Amount: None Present (0%) Fat Layer (Subcutaneous Tissue) Exposed: Yes Tendon Exposed: No Muscle Exposed: No Joint Exposed: No Bone Exposed: No Treatment Notes Wound #12 (Left Ischial Tuberosity) 1. Cleanse With Wound Cleanser 2. Periwound Care Skin Prep 3. Primary Dressing Applied Collagen 4. Secondary Dressing Foam Border Dressing Notes secondary saline moist gauze to dry Electronic Signature(s) Signed: 10/13/2019 5:25:24 PM By: Kela Millin Entered By: Kela Millin on 10/13/2019  09:21:17 -------------------------------------------------------------------------------- Vitals Details Patient Name: Date of Service: [facility] V [name], [name]  10/13/2019 9:00 A M Medical Record Number: [id number] Patient Account Number: [id number] Date of Birth/Sex: Treating RN: 1950/12/29 (69 y.o. [name] Primary Care Malasha Kleppe: [name] Other Clinician: Referring Abbott Jasinski: Treating Cecile Gillispie/Extender: [name] in Treatment: 41 Vital Signs Time Taken: 09:15 Temperature (F): 97.9 Height (in): 63 Pulse (bpm): 101 Weight (lbs): 185 Respiratory Rate (breaths/min): 18 Body Mass Index (BMI): 32.8 Blood Pressure (mmHg): 162/83 Reference Range: 80 - 120 mg / dl Electronic Signature(s) Signed: 10/13/2019 5:25:24 PM By: Kela Millin Entered By: Kela Millin on 10/13/2019 09:20:12

## 2019-10-18 NOTE — Progress Notes (Addendum)
[name], [name] ([id number]) Visit Report for 10/13/2019 Debridement Details Patient Name: Date of Service: [name], [name] 10/13/2019 9:00 [place] Record Number: [id number] Patient Account Number: [id number] Date of Birth/Sex: Treating RN: 1950/11/04 (69 y.o. [name], [name] Primary Care Provider: [name] Other Clinician: Referring Provider: Treating Provider/Extender: Tommy Rainwater in Treatment: 41 Debridement Performed for Assessment: Wound #12 Left Ischial Tuberosity Performed By: Physician Ricard Dillon., MD Debridement Type: Debridement Level of Consciousness (Pre-procedure): Awake and Alert Pre-procedure Verification/Time Out Yes - 09:48 Taken: Start Time: 09:48 T Area Debrided (L x W): otal 2.5 (cm) x 1.6 (cm) = 4 (cm) Tissue and other material debrided: Viable, Non-Viable, Subcutaneous Level: Skin/Subcutaneous Tissue Debridement Description: Excisional Instrument: Curette Bleeding: Minimum Hemostasis Achieved: Pressure End Time: 09:50 Procedural Pain: 0 Post Procedural Pain: 0 Response to Treatment: Procedure was tolerated well Level of Consciousness (Post- Awake and Alert procedure): Post Debridement Measurements of Total Wound Length: (cm) 2.5 Stage: Category/Stage IV Width: (cm) 1.6 Depth: (cm) 2.2 Volume: (cm) 6.912 Character of Wound/Ulcer Post Debridement: Improved Post Procedure Diagnosis Same as Pre-procedure Electronic Signature(s) Signed: 10/13/2019 5:28:43 PM By: Linton Ham MD Signed: 10/17/2019 5:04:17 PM By: Levan Hurst RN, BSN Entered By: Linton Ham on 10/13/2019 09:54:20 -------------------------------------------------------------------------------- HPI Details Patient Name: Date of Service: [name], [name] 10/13/2019 9:00 A M Medical Record Number: [id number] Patient Account Number: [id number] Date of Birth/Sex: Treating RN: Jun 30, 1950 (69 y.o. [name] Primary Care Provider: [name] Other  Clinician: Referring Provider: Treating Provider/Extender: Tommy Rainwater in Treatment: 41 History of Present Illness Location: open ulceration of the left gluteal area, left heel and right ankle for about 5 months. Quality: Patient reports No Pain. Severity: Patient states wound(s) are getting worse. Duration: Patient has had the wound for > 5 months prior to seeking treatment at the wound center Context: The wound occurred when the patient has been paraplegic for about 3 years. Modifying Factors: Wound improving due to current treatment. ssociated Signs and Symptoms: Patient reports having foul odor. A HPI Description: this 69 year old patient who [name] known to have hypertension, hypothyroidism, breast cancer, chronic pain syndrome, paraplegia was noted to have a left gluteal decubitus ulcer and was brought into the hospital. During the course of her hospitalization she was debrided in the operating room by Dr. Leland Johns and had all the wounds sharply debrided. The debridement was done for the left ischial wound, the left heel wound and the right ankle wound. Bone cultures were taken at that time but were negative but clinically she was treated for osteomyelitis because of the probing down to bone and open exposed bone. Home health has been giving her antibioticss which include vancomycin and Zosyn. The patient was a smoker until about 3 weeks ago and used to smoke about 10 cigarettes a day for a long while. 12/13/2014 - details of her operative note from 11/03/2014 were reviewed -- PROCEDURE: 1. Excisional debridement skin, subcutaneous, muscle left ischium 35 cm2 2. Excisional debridement skin, subcutaneous tissue left heel 27 cm2 3. Excisional debridement right ankle skin, subcutaneous, bone 30 cm2 01/24/2015 -- she has some issues with her wheelchair cushion but other than that [name] doing very well and has received Podus boots for her feet. 02/14/2015 -- she was using her old  offloading boots and this seemed to have caused her a new pressure ulcer on the left posterior heel near the superior part just below the Achilles tendon. 03/07/2015 -- she has a  new ulceration just to the left of the midline on her sacral region more on the left buttock and this has been there for about a week. 08/22/2015 -- was recently admitted to hospital between May 5 and 08/13/2015, with sepsis and leukocytosis due to a UTI. she was treated for a sepsis complicating Escherichia coli UTI and kidney stones. She also had metabolic and careful up at the secondary to pyelonephritis. He received broad-spectrum antibiotics initially and then received Macrobid as per urology. She was sent home on nitrofurantoin. during her admission she had a CT scan which showed exposed left ischial tuberosity without evidence of osteolysis. 09/12/2015-- the patient [name] having some issues with her air mattress and would like to get a opinion from medical modalities. 10/10/2015 -- the issue with her air mattress has not yet been sorted out and the new problem seems to be a lot of odor from the wound VAC. 11/27/2015 -- the patient was admitted to the hospital between July 23 and 10/31/2015. Her problems were sepsis, osteomyelitis of the pelvic bone and acute pyelonephritis. CT of the abdomen and pelvis was consistent with a left-sided pyelonephritis with hydronephrosis and also just showed new sclerosis of the posterior portion of the left anterior pubic ramus suggestive of periosteal reaction consistent with osteomyelitis. She was treated for the osteomyelitis with infectious disease consult recommending 6 weeks of IV antibiotics including vancomycin and Rocephin and the antibiotics were to go on until 12/10/2015. He was seen by Dr. Iran Planas plastic surgery and Dr. Linus Salmons of infectious disease. She had a suprapubic catheter placed during the admission. CT scan done on 10/28/2015 showed specifically -- New sclerosis of the  posterior portion of the left inferior pubic ramus with aggressive periosteal reaction, consistent with osteomyelitis, with adjacent soft tissue gas compatible with previously described decubitus ulcer. 12/12/2015 -- she was recently seen by Dr. Linus Salmons, who noted good improvement and CRP and ESR compared to before and he has stopped her antibiotic as per plans to finish on September 4. The patient was encouraged to continue with wound care and consider hyperbaric oxygen therapy. Today she tells me that she has consented to undergo hyperbaric oxygen therapy and we can start the paperwork. 01/02/2016 -- her PCP had gained about 3 years but she still persists in having problems during hyperbaric oxygen therapy with some discomfort in the ears. 01/09/16; pressure area with underlying osteomyelitis in the left buttock. Wound bed itself has some slight amount of grayish surface slough however I do not think any debridement was necessary. There [name] no exposed bone soft tissue appears stable. [name] using a wound VAC 01/16/16; back for weekly wound review in conjunction with HBO. She has a deep wound over the left initial tuberosity previously treated with 6 weeks of IV antibiotics for osteomyelitis. Wound bed looks reasonably healthy although the base of this [name] still precariously close to bone. She has been using a wound VAC. 01/23/2016 -- she has completed her course of antibiotics and this week the only new thing [name] her right great toe nail was avulsed and she has got an open wound over the nailbed. 01/31/16 she has completed her course of antibiotics. Her right great toenail avulsed last week and she's been using silver alginate for this as well. Still using a wound VAC to the substantial stage IV wound over the left ischial tuberosity 03/05/2016 -- the patient has had a opinion from the plastic surgery group at [facility] and details of this are not  available yet but the patient's verbal report has been  heard by me. Did not sound like there was any optimistic discussion regarding reconstruction and the net result would be to continue with the wound VAC application. I will await the official reports. Addendum: -- she was seen at [place] surgery service by Dr. Tressa Busman. After a thorough review and from what I understand spending 45 minutes with the patient his assessment has been noted by me in detail and the management options were: 1. Continued pressure offloading and wound care versus operative procedures including wound excision 2. Soft tissue and bone sampling 3. If the wound gets larger wound closure would be done using a variety of plastic surgical techniques including but not limited to skin substitute, possible skin graft, local versus regional flaps, negative pressure dressing application. 4. He discussed with her details of flap surgery and the risks associated 5. He made a comment that since the patient was operated on by Dr. Leland Johns of [facility] plastic surgery unit in [place] the patient may continue to follow-up there for further evaluation for surgical flap closure in the future. 03/19/2016 -- the patient continues to be rather depressed and frustrated with her lack of rapid progress in healing this wound especially because she thought after hyperbaric oxygen therapy the wound would heal extremely fast. She now understands that was not the implied benefit on wound care which was the recommendation for hyperbaric oxygen therapy. I have had a lengthy discussion with the patient and her husband regarding her options: 1. Continue with collagen and wound VAC for the primary dressing and offloading and all supportive care. 2. See Dr. Iran Planas for possible placement of Acell or Integra in the OR. 3. get a second opinion from a wound care center and surrounding regions/counties 05/07/2016 -- Note from Dr. Celedonio Miyamoto, who noted that the  patient has declined flap surgery. She has discussed application of A cell, and try a few applications to see how the wound progresses. [name] also recommended that we could apply products here in the wound center, like Oasis. during her preop workup it was found that her hemoglobin A1c was 11% and she has now been diagnosed as having diabetes mellitus and has been put on appropriate treatment by her PCP 05/28/2016 -- tells me her blood sugars have been doing well and she has an appointment to see her PCP in the next couple of weeks to check her hemoglobin A1c. Other than that she continues to do well. 06/25/2016 -- have not seen her back for the last month but she says her health has been about the same and she has an appointment to check the A1c next week 09/10/16 ---- was seen by Dr. Celedonio Miyamoto -- who applied Acell and saw her back in follow-up. She has recommended silver alginate to the wound every other day and cover with foam. If no significant drainage could transition to collagen every other day. She recommended discontinuing wound VAC. There were no plans to repeat application of Acell. The patient expressed that her husband could do the wound care as going to the Wound Ctr., would cost several $100 for each visit. 10/21/2016 -- her insurance company [name] getting her new mattress and [name] pleased about that. Other than that she has been doing dressings with PolyMem Silver and has been doing very well 02/18/2017 -- she has gone through several changes of her mattress and has not been pleased with any  of them. The ventricles are still working on trying to fit her with the appropriate low air-loss mattress. She has a new wound on the gluteal area which [name] clearly separated from the original wound. 03/25/17-[name] here in follow-up evaluation for her left ischialpressure ulcer. She remains unsatisfied with her pressure mattress. She admits to sitting multiple hours a day, in the bed. We have  discussed offloading options. The wound does not appear infected. Nutrition does not appear to be a concern. Will follow-up in 4 weeks, if wound continues to be stalled may consider x-ray to evaluate for refractory osteomyelitis. 04/21/17; this [name] a patient that I don't know all that well. She has a chronic wound which at one point had underlying osteomyelitis in the left ischial tuberosity. This [name] a stage IV pressure ulcer. Over the last 3 months she has a stage II wound inferiorly to the original wound. The last time she was here her dressing was changed to silver collagen although the patient's husband who changes the dressing said that the collagen stuck to the wound and remove skin from the superficial area therefore he switched back to [place] 05/13/17; this [name] a patient we've been following for a left ischial tuberosity wound which was stage IV at one point had underlying osteomyelitis. Over the last several months she's had a stage II wound just inferior and medial to the related to the wound. According to her husband he [name] using Endoform layer with collagen although this [name] not what I had last time. According to her husband they are using Elgie Congo with collagen although I don't quite know how that started. She was hospitalized from 1/20 through 04/30/16. This was related to a UTI. Her blood cultures were negative, urine culture showed multiple species. She did have a CT scan of the abdomen and pelvis which documented chronic osteomyelitis in the area of the wound inflammatory markers were unremarkable. She has had prior knowledge of osteomyelitis. It looks as though she received IV antibiotics in 2017 and was treated with a course of hyperbaric oxygen. 05/28/17; the wound over the left ischial tuberosity [name] deeper today and abuts clearly on bone. Nursing intake reported drainage. I therefore culture of the wound. The more superficial area just below this looks about the same. They once  again complained that there are mattress cover [name] not working although apparently [facility] [name] been noted to see this many times in the report [name] that the device [name] functional 06/18/17; the patient had a probing area on the left ischial tuberosity that was draining purulent fluid last time. This also clearly seemed to have open bone. Culture I did showed pansensitive pseudomonas including third generation cephalosporins. I treated this with cefdinir 300 twice a day for 10 days and things seem to have improved. She has a more superficial wound just underneath this area. Amazingly she has a new air mattress through [facility]. I think they gave this to her as a parking give. In any case this now works according to the patient may have something to do with why the areas are looking better. 07/09/17; the patient has a probing area in the left ischial tuberosity that still has some depth. However this [name] contracted in terms of the wound orifice although the depth [name] still roughly the same. There [name] no undermining. She also has the satellite wound which [name] more superficial. This appears to have a healthy surface we've been using silver collagen 08/06/17; the patient's  wound [name] over the left ischial tuberosity and a satellite lesion just underneath this. The original wound was actually a deep stage 4 wound. We have made good progress in 2 months and there [name] no longer exposed bone here. 09/03/17; left ischial tuberosity actually appears to be quite healthy. I think we are making progress. No debridement [name] required. There [name] no surrounding erythema 10/01/17 I follow this patient monthly for her left ischial tuberosity wound. There [name] 2 areas the original area and a satellite area. The satellite area looks a lot better there [name] no surrounding erythema. Her husband relates that he [name] having trouble maintaining the dressing. This has to do with the soft tissue around it. He states he puts the collagen  in but he cannot make sure that it stays in even with the ABD pads and tape that he [name] been using 10/29/17; patient arrives with a better looking noon today. Some of the satellite lesions have closed. using Prisma 11/26/17; the patient has a large cone-shaped area with the tip of the Cone deep within her buttock soft tissue. The walls of the Cone are epithelialized however the base [name] still open. The area at the base of this looks moist we've been using silver collagen. Will change to silver alginate 12/31/2017; the wound appears to have come in fairly nicely. Using silver alginate. There [name] no surrounding maceration or infection 01/28/18; there [name] still an open area here over the left initial tuberosity. Base of this however looks healthy. There [name] no surrounding infection 02/25/18; the area of its open [name] over the left ischial tuberosity. The base of this [name] where the wound [name]. This [name] a large inverted cone-shaped area with the wound at the tip. Dimensions of the wound at the tip are improved. There [name] a area of denuded skin about halfway towards the tip which her husband thinks may have happened today when he was bathing her. 04/20/17; the area [name] still open over the left initial tuberosity. This [name] an cone shaped wound with the tip where the wound remains area there [name] no evidence of infection, no erythema and no purulent drainage 5/12; very fragile patient who had a chronic stage IV wound over the left ischial tuberosity. This [name] now completely closed over although it [name] closed over with a divot and skin over bone at the base of this. Continued aggressive offloading will be necessary. 12/30/2018 READMISSION This [name] a 69 year old woman with chronic paraplegia. I picked her up for her care from Dr. Con Memos in this clinic after he departed. She had a stage IV pressure wound over the left ischial tuberosity. She was treated twice for her underlying osteomyelitis and this I believe firstly in 2016 and  again in 2017. There were some plans at some point for flap closure of this however she was discovered to have uncontrolled diabetes and I do not think this was ever accomplished. She ultimately healed over in this clinic and was discharged in May. She has a large cone-shaped indentation with the tip of this going towards the left ischial tuberosity. It [name] not an easy area to examine but at that time I thought all of this was epithelialized. Apparently there was a reopening here shortly after she left the clinic last time. She was admitted to hospital at the end of June for Klebsiella bacteremia felt to be secondary to UTI. A CT scan of the pelvis [name] listed below and there was initially some concern that she had  underlying osteomyelitis although I believe she was seen by infectious disease and that was felt to be not the case: I do not see any new cultures or inflammatory markers IMPRESSION: 1. No CT evidence for acute intra-abdominal or pelvic abnormality. Large volume of stool throughout the colon. 2. Enlarged fatty liver with fat sparing near the gallbladder fossa 3. Cortical scarring right kidney. Bilateral intrarenal stones without hydronephrosis. Thick-walled urinary bladder decompressed by suprapubic catheter 4. Deep left decubitus ulcer with underlying left ischial changes suggesting osteomyelitis. Her husband has been using silver collagen in the wound. She has not been systemically unwell no fever chills eating and drinking well. They rigorously offload this wound only getting up in the wheelchair when [name] going to appointments the rest of the time [name] in bed. 10/8; wound measures larger and she now has exposed bone. We have been using silver alginate 11/12 still using silver alginate. The patient saw Dr. Megan Salon of infectious disease. She was started on Augmentin 500 mg twice daily. [name] due to follow-up with Dr. Megan Salon I believe next week. Lab work Dr. Megan Salon requested  showed a sedimentation rate of 28 and CRP of 20 although her CRP 1 year ago was 18.8. Sedimentation rate 1 year ago was 11 basic metabolic panel showed a creatinine of 1.12 12/3; the patient followed up with Dr. Megan Salon yesterday. [name] still on Augmentin twice daily. This was directed by Dr. Megan Salon. The patient's inflammatory markers have improved which [name] gratifying. Her C-reactive protein was repeated yesterday and follow-up booked with infectious disease in January. In addition I have been getting secure text messages I think from palliative care through [facility] [employer]. I think they were hoping to provide services to the patient in her home. They could not get a hold of the primary physician and so they reached out to me on 2 separate occasions. 12/17; patient last saw Dr. Megan Salon on 12/2. [name] finishing up with Augmentin. Her C-reactive protein was 20 on 10/21, 10.1 on 11/19 and 17 on 12/2. The wound itself still has depth and undermining. We are using Santyl with the backing wet-to-dry 04/27/2019. The wound [name] gradually clearing up in terms of the surface although it [name] not filled in that much. Still abuts right against bone 2/4; patient with a deep pressure ulcer over the left ischial tuberosity. I thought she was going to follow-up with infectious disease to follow her inflammatory markers although the patient states that they stated that they did not need to see her unless we felt it was necessary. I will need to check their notes. In any case we ordered moistened silver collagen back with wet-to-dry to fill in the depth of the wound although apparently [facility] sent silver alginate which they have been using since they were here the last time. [name] obviously not what we ordered. 2/25. Not much change in this wound it [name] over the left ischial tuberosity recurrent wound. We have been using silver collagen with backing wet-to-dry. I think the wound [name] about the same.  There [name] still some tunneling from about 10-12 o'clock over the ischial tuberosity itself 3/11; pressure ulcer over the left ischial tuberosity. Since she was last here the wound VAC was started and apparently going quite well. We are able to get the home health company that accepts [place] healthcare which [name] in itself sometimes problematic. There [name] been improvements in the wound the tunneling seems to be better and [name] contracted  nicely 4/8; 1 month follow-up. Since she was last here we have been using silver collagen under a wound VAC. Some minor contraction I think in wound volume. [name] cared for diligently by her husband including pressure relief, incontinence management, nutritional support etc. 6/1; this [name] almost a 19-monthfollow-up. [name] been using silver collagen under wound VAC. Circular area over the left ischial tuberosity. She has been using silver collagen under wound VAC 7/8; 1 month follow-up. Silver collagen under the VAC not really a lot of progress. Tissue at the base of the wound which [name] right against bone and the tissue next that this does not look completely viable. [name] not currently on any antibiotics, she had underlying osteomyelitis I need to look this over Electronic Signature(s) Signed: 10/13/2019 5:28:43 PM By: RLinton HamMD Entered By: RLinton Hamon 10/13/2019 09:56:10 -------------------------------------------------------------------------------- Physical Exam Details Patient Name: Date of Service: [name] 10/13/2019 9:00 A M Medical Record Number: [id number] Account Number: [id number] of Birth/Sex: Treating RN: [birth year](69y.o. FNancy FetterPrimary Care Provider: SSandi MariscalOther Clinician: Referring Provider: Treating Provider/Extender: RTommy Rainwaterin Treatment: 482Constitutional Patient [name] hypertensive.. Pulse regular and within target range for patient..Marland KitchenRespirations regular, non-labored and  within target range.. Temperature [name] normal and within the target range for the patient..Marland KitchenAppears in no distress. Notes Wound exam; left ischial tuberosity. The wound [name] circular with considerable depth. Base and sides of the wound against bone not really that good looking tissue. I used a #5 curette to remove very gritty fibrinous material here. There [name] no gross purulence no evidence of surrounding soft tissue infection Electronic Signature(s) Signed: 10/13/2019 5:28:43 PM By: RLinton HamMD Entered By: RLinton Hamon 10/13/2019 09:57:27 -------------------------------------------------------------------------------- Physician Orders Details Patient Name: Date of Service: [name], [name] 10/13/2019 9:00 AButte MeadowsRecord Number: [id number] Account Number: [id number] of Birth/Sex: Treating RN: 505/09/1950(69y.o. FNancy FetterPrimary Care Provider: SSandi MariscalOther Clinician: Referring Provider: Treating Provider/Extender: RTommy Rainwaterin Treatment: 451Verbal / Phone Orders: No Diagnosis Coding ICD-10 Coding Code Description [id number] ulcer of left buttock, stage 4 G82.20 Paraplegia, unspecified E11.622 Type 2 diabetes mellitus with other skin ulcer M86.68 Other chronic osteomyelitis, other site Follow-up Appointments Return appointment in 1 month. Dressing Change Frequency Wound #12 Left Ischial Tuberosity Change dressing three times week. Skin Barriers/Peri-Wound Care Skin Prep - to periwound Wound Cleansing Wound #12 Left Ischial Tuberosity Clean wound with Normal Saline. - or wound cleanser Primary Wound Dressing Wound #12 Left Ischial Tuberosity Collagen - apply collagen under wound vac. Wet to dry in clinic today. Home health to apply wound vac at home. Secondary Dressing Wound #12 Left Ischial Tuberosity Foam Border Negative Presssure Wound Therapy Wound #12 Left Ischial Tuberosity Wound Vac to wound continuously at  1287mhg pressure - ***Please apply drape under black foam when bridging the wound vac.*** Black Foam - ***Please apply drape under black foam when bridging the wound vac.*** Off-Loading Low air-loss mattress (Group 2) Gel wheelchair cushion Turn and reposition every 2 [facility] Electronic Signature(s) Signed: 10/13/2019 5:28:43 PM By: RoLinton HamD Signed: 10/17/2019 5:04:17 PM By: LyLevan HurstN, BSN Entered By: LyLevan Hurstn 10/13/2019 09:52:33 -------------------------------------------------------------------------------- Problem List Details Patient Name: Date of Service: [name], [name] 10/13/2019 9:00 A M Medical Record Number: [id number] Account  Number: [id number] Date of Birth/Sex: Treating RN: Sep 22, 1950 (69 y.o. [name] Primary Care Provider: [name] Other Clinician: Referring Provider: Treating Provider/Extender: Tommy Rainwater in Treatment: 49 Active Problems ICD-10 Encounter Code Description Active Date MDM Diagnosis 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, stage 4 12/30/2018 No Yes G82.20 Paraplegia, unspecified 12/30/2018 No Yes E11.622 Type 2 diabetes mellitus with other skin ulcer 12/30/2018 No Yes M86.68 Other chronic osteomyelitis, other site 02/17/2019 No Yes Inactive Problems Resolved Problems Electronic Signature(s) Signed: 10/13/2019 5:28:43 PM By: Linton Ham MD Entered By: Linton Ham on 10/13/2019 09:54:01 -------------------------------------------------------------------------------- Progress Note Details Patient Name: Date of Service: [name], [name] 10/13/2019 9:00 A M Medical Record Number: [id number] Patient Account Number: [id number] Date of Birth/Sex: Treating RN: 10-11-1950 (70 y.o. [name] Primary Care Provider: [name] Other Clinician: Referring Provider: Treating Provider/Extender: Tommy Rainwater in Treatment: 41 Subjective History of Present Illness (HPI) The following HPI elements were documented for the patient's wound: Location: open ulceration of the left gluteal area, left heel and right ankle for about 5 months. Quality: Patient reports No Pain. Severity: Patient states wound(s) are getting worse. Duration: Patient has had the wound for > 5 months prior to seeking treatment at the wound center Context: The wound occurred when the patient has been paraplegic for about 3 years. Modifying Factors: Wound improving due to current treatment. Associated Signs and Symptoms: Patient reports having foul odor. this 70 year old patient who [name] known to have hypertension, hypothyroidism, breast cancer, chronic pain syndrome, paraplegia was noted to have a left gluteal decubitus ulcer and was brought into the hospital. During the course of her hospitalization she was debrided in the operating room by Dr. Leland Johns and had all the wounds sharply debrided. The debridement was done for the left ischial wound, the left heel wound and the right ankle wound. Bone cultures were taken at that time but were negative but clinically she was treated for osteomyelitis because of the probing down to bone and open exposed bone. Home health has been giving her antibioticss which include vancomycin and Zosyn. The patient was a smoker until about 3 weeks ago and used to smoke about 10 cigarettes a day for a long while. 12/13/2014 - details of her operative note from 11/03/2014 were reviewed -- PROCEDURE: 1. Excisional debridement skin, subcutaneous, muscle left ischium 35 cm2 2. Excisional debridement skin, subcutaneous tissue left heel 27 cm2 3. Excisional debridement right ankle skin, subcutaneous, bone 30 cm2 01/24/2015 -- she has some issues with her wheelchair cushion but other than that [name] doing very well and has received Podus boots for her feet. 02/14/2015 -- she was using her old  offloading boots and this seemed to have caused her a new pressure ulcer on the left posterior heel near the superior part just below the Achilles tendon. 03/07/2015 -- she has a new ulceration just to the left of the midline on her sacral region more on the left buttock and this has been there for about a week. 08/22/2015 -- was recently admitted to hospital between May 5 and 08/13/2015, with sepsis and leukocytosis due to a UTI. she was treated for a sepsis complicating Escherichia coli UTI and kidney stones. She also had metabolic and careful up at the secondary to pyelonephritis. He received broad-spectrum antibiotics initially and then received Macrobid as per urology. She was sent home on nitrofurantoin. during her admission she had a CT scan which showed exposed left ischial tuberosity without  evidence of osteolysis. 09/12/2015-- the patient [name] having some issues with her air mattress and would like to get a opinion from medical modalities. 10/10/2015 -- the issue with her air mattress has not yet been sorted out and the new problem seems to be a lot of odor from the wound VAC. 11/27/2015 -- the patient was admitted to the hospital between July 23 and 10/31/2015. Her problems were sepsis, osteomyelitis of the pelvic bone and acute pyelonephritis. CT of the abdomen and pelvis was consistent with a left-sided pyelonephritis with hydronephrosis and also just showed new sclerosis of the posterior portion of the left anterior pubic ramus suggestive of periosteal reaction consistent with osteomyelitis. She was treated for the osteomyelitis with infectious disease consult recommending 6 weeks of IV antibiotics including vancomycin and Rocephin and the antibiotics were to go on until 12/10/2015. He was seen by Dr. Iran Planas plastic surgery and Dr. Linus Salmons of infectious disease. She had a suprapubic catheter placed during the admission. CT scan done on 10/28/2015 showed specifically -- New sclerosis of the  posterior portion of the left inferior pubic ramus with aggressive periosteal reaction, consistent with osteomyelitis, with adjacent soft tissue gas compatible with previously described decubitus ulcer. 12/12/2015 -- she was recently seen by Dr. Linus Salmons, who noted good improvement and CRP and ESR compared to before and he has stopped her antibiotic as per plans to finish on September 4. The patient was encouraged to continue with wound care and consider hyperbaric oxygen therapy. Today she tells me that she has consented to undergo hyperbaric oxygen therapy and we can start the paperwork. 01/02/2016 -- her PCP had gained about 3 years but she still persists in having problems during hyperbaric oxygen therapy with some discomfort in the ears. 01/09/16; pressure area with underlying osteomyelitis in the left buttock. Wound bed itself has some slight amount of grayish surface slough however I do not think any debridement was necessary. There [name] no exposed bone soft tissue appears stable. [name] using a wound VAC 01/16/16; back for weekly wound review in conjunction with HBO. She has a deep wound over the left initial tuberosity previously treated with 6 weeks of IV antibiotics for osteomyelitis. Wound bed looks reasonably healthy although the base of this [name] still precariously close to bone. She has been using a wound VAC. 01/23/2016 -- she has completed her course of antibiotics and this week the only new thing [name] her right great toe nail was avulsed and she has got an open wound over the nailbed. 01/31/16 she has completed her course of antibiotics. Her right great toenail avulsed last week and she's been using silver alginate for this as well. Still using a wound VAC to the substantial stage IV wound over the left ischial tuberosity 03/05/2016 -- the patient has had a opinion from the plastic surgery group at [facility] and details of this are not available yet but the patient's verbal report has been  heard by me. Did not sound like there was any optimistic discussion regarding reconstruction and the net result would be to continue with the wound VAC application. I will await the official reports. Addendum: -- she was seen at [facility] by Dr. Tressa Busman. After a thorough review and from what I understand spending 45 minutes with the patient his assessment has been noted by me in detail and the management options were: 1. Continued pressure offloading and wound care versus operative procedures including wound excision 2. Soft tissue  and bone sampling 3. If the wound gets larger wound closure would be done using a variety of plastic surgical techniques including but not limited to skin substitute, possible skin graft, local versus regional flaps, negative pressure dressing application. 4. He discussed with her details of flap surgery and the risks associated 5. He made a comment that since the patient was operated on by Dr. Leland Johns of [facility] plastic surgery unit in [place] the patient may continue to follow-up there for further evaluation for surgical flap closure in the future. 03/19/2016 -- the patient continues to be rather depressed and frustrated with her lack of rapid progress in healing this wound especially because she thought after hyperbaric oxygen therapy the wound would heal extremely fast. She now understands that was not the implied benefit on wound care which was the recommendation for hyperbaric oxygen therapy. I have had a lengthy discussion with the patient and her husband regarding her options: 1. Continue with collagen and wound VAC for the primary dressing and offloading and all supportive care. 2. See Dr. Iran Planas for possible placement of Acell or Integra in the OR. 3. get a second opinion from a wound care center and surrounding regions/counties 05/07/2016 -- Note from Dr. Celedonio Miyamoto, who noted that the  patient has declined flap surgery. She has discussed application of A cell, and try a few applications to see how the wound progresses. [name] also recommended that we could apply products here in the wound center, like Oasis. during her preop workup it was found that her hemoglobin A1c was 11% and she has now been diagnosed as having diabetes mellitus and has been put on appropriate treatment by her PCP 05/28/2016 -- tells me her blood sugars have been doing well and she has an appointment to see her PCP in the next couple of weeks to check her hemoglobin A1c. Other than that she continues to do well. 06/25/2016 -- have not seen her back for the last month but she says her health has been about the same and she has an appointment to check the A1c next week 09/10/16 ---- was seen by Dr. Celedonio Miyamoto -- who applied Acell and saw her back in follow-up. She has recommended silver alginate to the wound every other day and cover with foam. If no significant drainage could transition to collagen every other day. She recommended discontinuing wound VAC. There were no plans to repeat application of Acell. The patient expressed that her husband could do the wound care as going to the Wound Ctr., would cost several $100 for each visit. 10/21/2016 -- her insurance company [name] getting her new mattress and [name] pleased about that. Other than that she has been doing dressings with PolyMem Silver and has been doing very well 02/18/2017 -- she has gone through several changes of her mattress and has not been pleased with any of them. The ventricles are still working on trying to fit her with the appropriate low air-loss mattress. She has a new wound on the gluteal area which [name] clearly separated from the original wound. 03/25/17-[name] here in follow-up evaluation for her left ischialpressure ulcer. She remains unsatisfied with her pressure mattress. She admits to sitting multiple hours a day, in the bed. We have  discussed offloading options. The wound does not appear infected. Nutrition does not appear to be a concern. Will follow-up in 4 weeks, if wound continues to be stalled may consider x-ray to evaluate for refractory osteomyelitis. 04/21/17; this  [name] a patient that I don't know all that well. She has a chronic wound which at one point had underlying osteomyelitis in the left ischial tuberosity. This [name] a stage IV pressure ulcer. Over the last 3 months she has a stage II wound inferiorly to the original wound. The last time she was here her dressing was changed to silver collagen although the patient's husband who changes the dressing said that the collagen stuck to the wound and remove skin from the superficial area therefore he switched back to [place] 05/13/17; this [name] a patient we've been following for a left ischial tuberosity wound which was stage IV at one point had underlying osteomyelitis. Over the last several months she's had a stage II wound just inferior and medial to the related to the wound. According to her husband he [name] using Endoform layer with collagen although this [name] not what I had last time. According to her husband they are using Elgie Congo with collagen although I don't quite know how that started. She was hospitalized from 1/20 through 04/30/16. This was related to a UTI. Her blood cultures were negative, urine culture showed multiple species. She did have a CT scan of the abdomen and pelvis which documented chronic osteomyelitis in the area of the wound inflammatory markers were unremarkable. She has had prior knowledge of osteomyelitis. It looks as though she received IV antibiotics in 2017 and was treated with a course of hyperbaric oxygen. 05/28/17; the wound over the left ischial tuberosity [name] deeper today and abuts clearly on bone. Nursing intake reported drainage. I therefore culture of the wound. The more superficial area just below this looks about the same. They once  again complained that there are mattress cover [name] not working although apparently [facility] [name] been noted to see this many times in the report [name] that the device [name] functional 06/18/17; the patient had a probing area on the left ischial tuberosity that was draining purulent fluid last time. This also clearly seemed to have open bone. Culture I did showed pansensitive pseudomonas including third generation cephalosporins. I treated this with cefdinir 300 twice a day for 10 days and things seem to have improved. She has a more superficial wound just underneath this area. Amazingly she has a new air mattress through [facility]. I think they gave this to her as a parking give. In any case this now works according to the patient may have something to do with why the areas are looking better. 07/09/17; the patient has a probing area in the left ischial tuberosity that still has some depth. However this [name] contracted in terms of the wound orifice although the depth [name] still roughly the same. There [name] no undermining. ooShe also has the satellite wound which [name] more superficial. This appears to have a healthy surface we've been using silver collagen 08/06/17; the patient's wound [name] over the left ischial tuberosity and a satellite lesion just underneath this. The original wound was actually a deep stage 4 wound. We have made good progress in 2 months and there [name] no longer exposed bone here. 09/03/17; left ischial tuberosity actually appears to be quite healthy. I think we are making progress. No debridement [name] required. There [name] no surrounding erythema 10/01/17 I follow this patient monthly for her left ischial tuberosity wound. There [name] 2 areas the original area and a satellite area. The satellite area looks a lot better there [name] no surrounding erythema. Her husband relates that he  [name] having trouble maintaining the dressing. This has to do with the soft tissue around it. He states he puts the  collagen in but he cannot make sure that it stays in even with the ABD pads and tape that he [name] been using 10/29/17; patient arrives with a better looking noon today. Some of the satellite lesions have closed. using Prisma 11/26/17; the patient has a large cone-shaped area with the tip of the Cone deep within her buttock soft tissue. The walls of the Cone are epithelialized however the base [name] still open. The area at the base of this looks moist we've been using silver collagen. Will change to silver alginate 12/31/2017; the wound appears to have come in fairly nicely. Using silver alginate. There [name] no surrounding maceration or infection 01/28/18; there [name] still an open area here over the left initial tuberosity. Base of this however looks healthy. There [name] no surrounding infection 02/25/18; the area of its open [name] over the left ischial tuberosity. The base of this [name] where the wound [name]. This [name] a large inverted cone-shaped area with the wound at the tip. Dimensions of the wound at the tip are improved. There [name] a area of denuded skin about halfway towards the tip which her husband thinks may have happened today when he was bathing her. 04/20/17; the area [name] still open over the left initial tuberosity. This [name] an cone shaped wound with the tip where the wound remains area there [name] no evidence of infection, no erythema and no purulent drainage 5/12; very fragile patient who had a chronic stage IV wound over the left ischial tuberosity. This [name] now completely closed over although it [name] closed over with a divot and skin over bone at the base of this. Continued aggressive offloading will be necessary. 12/30/2018 READMISSION This [name] a 69 year old woman with chronic paraplegia. I picked her up for her care from Dr. Con Memos in this clinic after he departed. She had a stage IV pressure wound over the left ischial tuberosity. She was treated twice for her underlying osteomyelitis and this I believe firstly in  2016 and again in 2017. There were some plans at some point for flap closure of this however she was discovered to have uncontrolled diabetes and I do not think this was ever accomplished. She ultimately healed over in this clinic and was discharged in May. She has a large cone-shaped indentation with the tip of this going towards the left ischial tuberosity. It [name] not an easy area to examine but at that time I thought all of this was epithelialized. Apparently there was a reopening here shortly after she left the clinic last time. She was admitted to hospital at the end of June for Klebsiella bacteremia felt to be secondary to UTI. A CT scan of the pelvis [name] listed below and there was initially some concern that she had underlying osteomyelitis although I believe she was seen by infectious disease and that was felt to be not the case: I do not see any new cultures or inflammatory markers IMPRESSION: 1. No CT evidence for acute intra-abdominal or pelvic abnormality. Large volume of stool throughout the colon. 2. Enlarged fatty liver with fat sparing near the gallbladder fossa 3. Cortical scarring right kidney. Bilateral intrarenal stones without hydronephrosis. Thick-walled urinary bladder decompressed by suprapubic catheter 4. Deep left decubitus ulcer with underlying left ischial changes suggesting osteomyelitis. Her husband has been using silver collagen in the wound. She has not been systemically unwell no  fever chills eating and drinking well. They rigorously offload this wound only getting up in the wheelchair when [name] going to appointments the rest of the time [name] in bed. 10/8; wound measures larger and she now has exposed bone. We have been using silver alginate 11/12 still using silver alginate. The patient saw Dr. Megan Salon of infectious disease. She was started on Augmentin 500 mg twice daily. [name] due to follow-up with Dr. Megan Salon I believe next week. Lab work Dr. Megan Salon  requested showed a sedimentation rate of 28 and CRP of 20 although her CRP 1 year ago was 18.8. Sedimentation rate 1 year ago was 11 basic metabolic panel showed a creatinine of 1.12 12/3; the patient followed up with Dr. Megan Salon yesterday. [name] still on Augmentin twice daily. This was directed by Dr. Megan Salon. The patient's inflammatory markers have improved which [name] gratifying. Her C-reactive protein was repeated yesterday and follow-up booked with infectious disease in January. In addition I have been getting secure text messages I think from palliative care through [facility] [employer]. I think they were hoping to provide services to the patient in her home. They could not get a hold of the primary physician and so they reached out to me on 2 separate occasions. 12/17; patient last saw Dr. Megan Salon on 12/2. [name] finishing up with Augmentin. Her C-reactive protein was 20 on 10/21, 10.1 on 11/19 and 17 on 12/2. The wound itself still has depth and undermining. We are using Santyl with the backing wet-to-dry 04/27/2019. The wound [name] gradually clearing up in terms of the surface although it [name] not filled in that much. Still abuts right against bone 2/4; patient with a deep pressure ulcer over the left ischial tuberosity. I thought she was going to follow-up with infectious disease to follow her inflammatory markers although the patient states that they stated that they did not need to see her unless we felt it was necessary. I will need to check their notes. In any case we ordered moistened silver collagen back with wet-to-dry to fill in the depth of the wound although apparently [facility] sent silver alginate which they have been using since they were here the last time. [name] obviously not what we ordered. 2/25. Not much change in this wound it [name] over the left ischial tuberosity recurrent wound. We have been using silver collagen with backing wet-to-dry. I think the wound [name] about  the same. There [name] still some tunneling from about 10-12 o'clock over the ischial tuberosity itself 3/11; pressure ulcer over the left ischial tuberosity. Since she was last here the wound VAC was started and apparently going quite well. We are able to get the home health company that accepts [place] [employer] which [name] in itself sometimes problematic. There [name] been improvements in the wound the tunneling seems to be better and [name] contracted nicely 4/8; 1 month follow-up. Since she was last here we have been using silver collagen under a wound VAC. Some minor contraction I think in wound volume. [name] cared for diligently by her husband including pressure relief, incontinence management, nutritional support etc. 6/1; this [name] almost a 9-monthfollow-up. [name] been using silver collagen under wound VAC. Circular area over the left ischial tuberosity. She has been using silver collagen under wound VAC 7/8; 1 month follow-up. Silver collagen under the VAC not really a lot of progress. Tissue at the base of the wound which [name] right against bone and the  tissue next that this does not look completely viable. [name] not currently on any antibiotics, she had underlying osteomyelitis I need to look this over Objective Constitutional Patient [name] hypertensive.. Pulse regular and within target range for patient.Marland Kitchen Respirations regular, non-labored and within target range.. Temperature [name] normal and within the target range for the patient.Marland Kitchen Appears in no distress. Vitals Time Taken: 9:15 AM, Height: 63 in, Weight: 185 lbs, BMI: 32.8, Temperature: 97.9 F, Pulse: 101 bpm, Respiratory Rate: 18 breaths/min, Blood Pressure: 162/83 mmHg. General Notes: Wound exam; left ischial tuberosity. The wound [name] circular with considerable depth. Base and sides of the wound against bone not really that good looking tissue. I used a #5 curette to remove very gritty fibrinous material here. There [name] no gross purulence no evidence  of surrounding soft tissue infection Integumentary (Hair, Skin) Wound #12 status [name] Open. Original cause of wound was Pressure Injury. The wound [name] located on the Left Ischial Tuberosity. The wound measures 2.5cm length x 1.6cm width x 2.2cm depth; 3.142cm^2 area and 6.912cm^3 volume. There [name] Fat Layer (Subcutaneous Tissue) Exposed exposed. There [name] no tunneling or undermining noted. There [name] a medium amount of serosanguineous drainage noted. The wound margin [name] well defined and not attached to the wound base. There [name] large (67-100%) red, pink granulation within the wound bed. There [name] no necrotic tissue within the wound bed. Assessment Active Problems ICD-10 Pressure ulcer of left buttock, stage 4 Paraplegia, unspecified Type 2 diabetes mellitus with other skin ulcer Other chronic osteomyelitis, other site Procedures Wound #12 Pre-procedure diagnosis of Wound #12 [name] a Pressure Ulcer located on the Left Ischial Tuberosity . There was a Excisional Skin/Subcutaneous Tissue Debridement with a total area of 4 sq cm performed by Ricard Dillon., MD. With the following instrument(s): Curette to remove Viable and Non-Viable tissue/material. Material removed includes Subcutaneous Tissue. No specimens were taken. A time out was conducted at 09:48, prior to the start of the procedure. A Minimum amount of bleeding was controlled with Pressure. The procedure was tolerated well with a pain level of 0 throughout and a pain level of 0 following the procedure. Post Debridement Measurements: 2.5cm length x 1.6cm width x 2.2cm depth; 6.912cm^3 volume. Post debridement Stage noted as Category/Stage IV. Character of Wound/Ulcer Post Debridement [name] improved. Post procedure Diagnosis Wound #12: Same as Pre-Procedure Plan Follow-up Appointments: Return appointment in 1 month. Dressing Change Frequency: Wound #12 Left Ischial Tuberosity: Change dressing three times week. Skin Barriers/Peri-Wound  Care: Skin Prep - to periwound Wound Cleansing: Wound #12 Left Ischial Tuberosity: Clean wound with Normal Saline. - or wound cleanser Primary Wound Dressing: Wound #12 Left Ischial Tuberosity: Collagen - apply collagen under wound vac. Wet to dry in clinic today. Home health to apply wound vac at home. Secondary Dressing: Wound #12 Left Ischial Tuberosity: Foam Border Negative Presssure Wound Therapy: Wound #12 Left Ischial Tuberosity: Wound Vac to wound continuously at 178m/hg pressure - ***Please apply drape under black foam when bridging the wound vac.*** Black Foam - ***Please apply drape under black foam when bridging the wound vac.*** Off-Loading: Low air-loss mattress (Group 2) Gel wheelchair cushion Turn and reposition every 2 hours [facility]. I am continue with silver collagen in the wound VAC 2. I need to look back over infectious disease issues she has not been on any antibiotics I do not believe according to my notes for the last 6 months. Inflammatory markers  are not really being followed. Electronic Signature(s) Signed: 10/13/2019 5:28:43 PM By: Linton Ham MD Entered By: Linton Ham on 10/13/2019 09:58:07 -------------------------------------------------------------------------------- SuperBill Details Patient Name: Date of Service: [name], [name] 10/13/2019 Medical Record Number: [id number] Patient Account Number: [id number] Date of Birth/Sex: Treating RN: [birth year] (69 y.o. [name] Primary Care Provider: [name] Other Clinician: Referring Provider: Treating Provider/Extender: Tommy Rainwater in Treatment: 41 Diagnosis Coding ICD-10 Codes Code Description [phone] Pressure ulcer of left buttock, stage 4 G82.20 Paraplegia, unspecified E11.622 Type 2 diabetes mellitus with other skin ulcer M86.68 Other chronic osteomyelitis, other site Facility  Procedures The patient participates with Medicare or their insurance follows the Medicare Facility Guidelines: CPT4 Code Description Modifier Quantity [id number] - DEB SUBQ TISSUE 20 SQ CM/< 1 ICD-10 Diagnosis Description 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,  stage 4 G82.20 Paraplegia, unspecified Physician Procedures : CPT4 Code Description Modifier [id number] - WC PHYS SUBQ TISS 20 SQ CM ICD-10 Diagnosis Description 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, stage 4 G82.20 Paraplegia, unspecified Quantity: 1 Electronic Signature(s) Signed: 10/13/2019 5:28:43 PM By: Linton Ham MD Entered By: Linton Ham on 10/13/2019 09:58:28

## 2019-11-10 ENCOUNTER — Encounter (HOSPITAL_BASED_OUTPATIENT_CLINIC_OR_DEPARTMENT_OTHER): Payer: Medicare Other | Admitting: Physician Assistant

## 2019-11-21 ENCOUNTER — Encounter (HOSPITAL_BASED_OUTPATIENT_CLINIC_OR_DEPARTMENT_OTHER): Payer: Medicare Other | Attending: Internal Medicine | Admitting: Internal Medicine

## 2019-11-21 ENCOUNTER — Other Ambulatory Visit: Payer: Self-pay

## 2019-11-21 DIAGNOSIS — Z87440 Personal history of urinary (tract) infections: Secondary | ICD-10-CM | POA: Diagnosis not present

## 2019-11-21 DIAGNOSIS — K760 Fatty (change of) liver, not elsewhere classified: Secondary | ICD-10-CM | POA: Insufficient documentation

## 2019-11-21 DIAGNOSIS — G8220 Paraplegia, unspecified: Secondary | ICD-10-CM | POA: Insufficient documentation

## 2019-11-21 DIAGNOSIS — I10 Essential (primary) hypertension: Secondary | ICD-10-CM | POA: Diagnosis not present

## 2019-11-21 DIAGNOSIS — E1151 Type 2 diabetes mellitus with diabetic peripheral angiopathy without gangrene: Secondary | ICD-10-CM | POA: Insufficient documentation

## 2019-11-21 DIAGNOSIS — F329 Major depressive disorder, single episode, unspecified: Secondary | ICD-10-CM | POA: Insufficient documentation

## 2019-11-21 DIAGNOSIS — E11622 Type 2 diabetes mellitus with other skin ulcer: Secondary | ICD-10-CM | POA: Diagnosis not present

## 2019-11-21 DIAGNOSIS — L89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, stage 4: Secondary | ICD-10-CM | POA: Diagnosis not present

## 2019-11-21 DIAGNOSIS — G894 Chronic pain syndrome: Secondary | ICD-10-CM | POA: Diagnosis not present

## 2019-11-21 DIAGNOSIS — F0390 Unspecified dementia without behavioral disturbance: Secondary | ICD-10-CM | POA: Diagnosis not present

## 2019-11-21 DIAGNOSIS — E039 Hypothyroidism, unspecified: Secondary | ICD-10-CM | POA: Diagnosis not present

## 2019-11-21 DIAGNOSIS — Z853 Personal history of malignant neoplasm of breast: Secondary | ICD-10-CM | POA: Insufficient documentation

## 2019-11-21 DIAGNOSIS — N200 Calculus of kidney: Secondary | ICD-10-CM | POA: Diagnosis not present

## 2019-11-21 DIAGNOSIS — Z87442 Personal history of urinary calculi: Secondary | ICD-10-CM | POA: Insufficient documentation

## 2019-11-21 NOTE — Progress Notes (Signed)
VRINDA, HECKSTALL ([id number]) Visit Report for 11/21/2019 HPI Details Patient Name: Date of Service: HARMAN, FERRIN 11/21/2019 10:00 [place] Record Number: [id number] Patient Account Number: [id number] Date of Birth/Sex: Treating RN: 1950/05/24 (69 y.o. [name] Primary Care Provider: [name] Other Clinician: Referring Provider: Treating Provider/Extender: Tommy Rainwater in Treatment: 46 History of Present Illness Location: open ulceration of the left gluteal area, left heel and right ankle for about 5 months. Quality: Patient reports No Pain. Severity: Patient states wound(s) are getting worse. Duration: Patient has had the wound for > 5 months prior to seeking treatment at the wound center Context: The wound occurred when the patient has been paraplegic for about 3 years. Modifying Factors: Wound improving due to current treatment. ssociated Signs and Symptoms: Patient reports having foul odor. A HPI Description: this 69 year old patient who is known to have hypertension, hypothyroidism, breast cancer, chronic pain syndrome, paraplegia was noted to have a left gluteal decubitus ulcer and was brought into the hospital. During the course of her hospitalization she was debrided in the operating room by Dr. Leland Johns and had all the wounds sharply debrided. The debridement was done for the left ischial wound, the left heel wound and the right ankle wound. Bone cultures were taken at that time but were negative but clinically she was treated for osteomyelitis because of the probing down to bone and open exposed bone. Home health has been giving her antibioticss which include vancomycin and Zosyn. The patient was a smoker until about 3 weeks ago and used to smoke about 10 cigarettes a day for a long while. 12/13/2014 - details of her operative note from 11/03/2014 were reviewed -- PROCEDURE: 1. Excisional debridement skin, subcutaneous, muscle left ischium 35 cm2  2. Excisional debridement skin, subcutaneous tissue left heel 27 cm2 3. Excisional debridement right ankle skin, subcutaneous, bone 30 cm2 01/24/2015 -- she has some issues with her wheelchair cushion but other than that is doing very well and has received Podus boots for her feet. 02/14/2015 -- she was using her old offloading boots and this seemed to have caused her a new pressure ulcer on the left posterior heel near the superior part just below the Achilles tendon. 03/07/2015 -- she has a new ulceration just to the left of the midline on her sacral region more on the left buttock and this has been there for about a week. 08/22/2015 -- was recently admitted to hospital between May 5 and 08/13/2015, with sepsis and leukocytosis due to a UTI. she was treated for a sepsis complicating Escherichia coli UTI and kidney stones. She also had metabolic and careful up at the secondary to pyelonephritis. He received broad-spectrum antibiotics initially and then received Macrobid as per urology. She was sent home on nitrofurantoin. during her admission she had a CT scan which showed exposed left ischial tuberosity without evidence of osteolysis. 09/12/2015-- the patient is having some issues with her air mattress and would like to get a opinion from medical modalities. 10/10/2015 -- the issue with her air mattress has not yet been sorted out and the new problem seems to be a lot of odor from the wound VAC. 11/27/2015 -- the patient was admitted to the hospital between July 23 and 10/31/2015. Her problems were sepsis, osteomyelitis of the pelvic bone and acute pyelonephritis. CT of the abdomen and pelvis was consistent with a left-sided pyelonephritis with hydronephrosis and also just showed new sclerosis of the posterior portion of the left  anterior pubic ramus suggestive of periosteal reaction consistent with osteomyelitis. She was treated for the osteomyelitis with infectious disease consult recommending 6  weeks of IV antibiotics including vancomycin and Rocephin and the antibiotics were to go on until 12/10/2015. He was seen by Dr. Iran Planas plastic surgery and Dr. Linus Salmons of infectious disease. She had a suprapubic catheter placed during the admission. CT scan done on 10/28/2015 showed specifically -- New sclerosis of the posterior portion of the left inferior pubic ramus with aggressive periosteal reaction, consistent with osteomyelitis, with adjacent soft tissue gas compatible with previously described decubitus ulcer. 12/12/2015 -- she was recently seen by Dr. Linus Salmons, who noted good improvement and CRP and ESR compared to before and he has stopped her antibiotic as per plans to finish on September 4. The patient was encouraged to continue with wound care and consider hyperbaric oxygen therapy. Today she tells me that she has consented to undergo hyperbaric oxygen therapy and we can start the paperwork. 01/02/2016 -- her PCP had gained about 3 years but she still persists in having problems during hyperbaric oxygen therapy with some discomfort in the ears. 01/09/16; pressure area with underlying osteomyelitis in the left buttock. Wound bed itself has some slight amount of grayish surface slough however I do not think any debridement was necessary. There is no exposed bone soft tissue appears stable. She is using a wound VAC 01/16/16; back for weekly wound review in conjunction with HBO. She has a deep wound over the left initial tuberosity previously treated with 6 weeks of IV antibiotics for osteomyelitis. Wound bed looks reasonably healthy although the base of this is still precariously close to bone. She has been using a wound VAC. 01/23/2016 -- she has completed her course of antibiotics and this week the only new thing is her right great toe nail was avulsed and she has got an open wound over the nailbed. 01/31/16 she has completed her course of antibiotics. Her right great toenail avulsed last  week and she's been using silver alginate for this as well. Still using a wound VAC to the substantial stage IV wound over the left ischial tuberosity 03/05/2016 -- the patient has had a opinion from the plastic surgery group at [facility] and details of this are not available yet but the patient's verbal report has been heard by me. Did not sound like there was any optimistic discussion regarding reconstruction and the net result would be to continue with the wound VAC application. I will await the official reports. Addendum: -- she was seen at [place] surgery service by Dr. Tressa Busman. After a thorough review and from what I understand spending 45 minutes with the patient his assessment has been noted by me in detail and the management options were: 1. Continued pressure offloading and wound care versus operative procedures including wound excision 2. Soft tissue and bone sampling 3. If the wound gets larger wound closure would be done using a variety of plastic surgical techniques including but not limited to skin substitute, possible skin graft, local versus regional flaps, negative pressure dressing application. 4. He discussed with her details of flap surgery and the risks associated 5. He made a comment that since the patient was operated on by Dr. Leland Johns of [facility] plastic surgery unit in [place] the patient may continue to follow-up there for further evaluation for surgical flap closure in the future. 03/19/2016 -- the patient continues to be rather depressed and frustrated with her  lack of rapid progress in healing this wound especially because she thought after hyperbaric oxygen therapy the wound would heal extremely fast. She now understands that was not the implied benefit on wound care which was the recommendation for hyperbaric oxygen therapy. I have had a lengthy discussion with the patient and her husband regarding her options: 1.  Continue with collagen and wound VAC for the primary dressing and offloading and all supportive care. 2. See Dr. Iran Planas for possible placement of Acell or Integra in the OR. 3. get a second opinion from a wound care center and surrounding regions/counties 05/07/2016 -- Note from Dr. Celedonio Miyamoto, who noted that the patient has declined flap surgery. She has discussed application of A cell, and try a few applications to see how the wound progresses. She is also recommended that we could apply products here in the wound center, like Oasis. during her preop workup it was found that her hemoglobin A1c was 11% and she has now been diagnosed as having diabetes mellitus and has been put on appropriate treatment by her PCP 05/28/2016 -- tells me her blood sugars have been doing well and she has an appointment to see her PCP in the next couple of weeks to check her hemoglobin A1c. Other than that she continues to do well. 06/25/2016 -- have not seen her back for the last month but she says her health has been about the same and she has an appointment to check the A1c next week 09/10/16 ---- was seen by Dr. Celedonio Miyamoto -- who applied Acell and saw her back in follow-up. She has recommended silver alginate to the wound every other day and cover with foam. If no significant drainage could transition to collagen every other day. She recommended discontinuing wound VAC. There were no plans to repeat application of Acell. The patient expressed that her husband could do the wound care as going to the Wound Ctr., would cost several $100 for each visit. 10/21/2016 -- her insurance company is getting her new mattress and she is pleased about that. Other than that she has been doing dressings with PolyMem Silver and has been doing very well 02/18/2017 -- she has gone through several changes of her mattress and has not been pleased with any of them. The ventricles are still working on trying to fit her with  the appropriate low air-loss mattress. She has a new wound on the gluteal area which is clearly separated from the original wound. 03/25/17-she is here in follow-up evaluation for her left ischialpressure ulcer. She remains unsatisfied with her pressure mattress. She admits to sitting multiple hours a day, in the bed. We have discussed offloading options. The wound does not appear infected. Nutrition does not appear to be a concern. Will follow-up in 4 weeks, if wound continues to be stalled may consider x-ray to evaluate for refractory osteomyelitis. 04/21/17; this is a patient that I don't know all that well. She has a chronic wound which at one point had underlying osteomyelitis in the left ischial tuberosity. This is a stage IV pressure ulcer. Over the last 3 months she has a stage II wound inferiorly to the original wound. The last time she was here her dressing was changed to silver collagen although the patient's husband who changes the dressing said that the collagen stuck to the wound and remove skin from the superficial area therefore he switched back to [place] 05/13/17; this is a patient we've been following for a left ischial tuberosity  wound which was stage IV at one point had underlying osteomyelitis. Over the last several months she's had a stage II wound just inferior and medial to the related to the wound. According to her husband he is using Endoform layer with collagen although this is not what I had last time. According to her husband they are using Elgie Congo with collagen although I don't quite know how that started. She was hospitalized from 1/20 through 04/30/16. This was related to a UTI. Her blood cultures were negative, urine culture showed multiple species. She did have a CT scan of the abdomen and pelvis which documented chronic osteomyelitis in the area of the wound inflammatory markers were unremarkable. She has had prior knowledge of osteomyelitis. It looks as though  she received IV antibiotics in 2017 and was treated with a course of hyperbaric oxygen. 05/28/17; the wound over the left ischial tuberosity is deeper today and abuts clearly on bone. Nursing intake reported drainage. I therefore culture of the wound. The more superficial area just below this looks about the same. They once again complained that there are mattress cover is not working although apparently [facility] is been noted to see this many times in the report is that the device is functional 06/18/17; the patient had a probing area on the left ischial tuberosity that was draining purulent fluid last time. This also clearly seemed to have open bone. Culture I did showed pansensitive pseudomonas including third generation cephalosporins. I treated this with cefdinir 300 twice a day for 10 days and things seem to have improved. She has a more superficial wound just underneath this area. Amazingly she has a new air mattress through [facility]. I think they gave this to her as a parking give. In any case this now works according to the patient may have something to do with why the areas are looking better. 07/09/17; the patient has a probing area in the left ischial tuberosity that still has some depth. However this is contracted in terms of the wound orifice although the depth is still roughly the same. There is no undermining. She also has the satellite wound which is more superficial. This appears to have a healthy surface we've been using silver collagen 08/06/17; the patient's wound is over the left ischial tuberosity and a satellite lesion just underneath this. The original wound was actually a deep stage 4 wound. We have made good progress in 2 months and there is no longer exposed bone here. 09/03/17; left ischial tuberosity actually appears to be quite healthy. I think we are making progress. No debridement is required. There is no surrounding erythema 10/01/17 I follow this patient  monthly for her left ischial tuberosity wound. There is 2 areas the original area and a satellite area. The satellite area looks a lot better there is no surrounding erythema. Her husband relates that he is having trouble maintaining the dressing. This has to do with the soft tissue around it. He states he puts the collagen in but he cannot make sure that it stays in even with the ABD pads and tape that he is been using 10/29/17; patient arrives with a better looking noon today. Some of the satellite lesions have closed. using Prisma 11/26/17; the patient has a large cone-shaped area with the tip of the Cone deep within her buttock soft tissue. The walls of the Cone are epithelialized however the base is still open. The area at the base of this looks moist we've  been using silver collagen. Will change to silver alginate 12/31/2017; the wound appears to have come in fairly nicely. Using silver alginate. There is no surrounding maceration or infection 01/28/18; there is still an open area here over the left initial tuberosity. Base of this however looks healthy. There is no surrounding infection 02/25/18; the area of its open is over the left ischial tuberosity. The base of this is where the wound is. This is a large inverted cone-shaped area with the wound at the tip. Dimensions of the wound at the tip are improved. There is a area of denuded skin about halfway towards the tip which her husband thinks may have happened today when he was bathing her. 04/20/17; the area is still open over the left initial tuberosity. 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 This is now completely closed over although it is closed over with a divot and skin over bone at the base of this. Continued aggressive offloading will be  necessary. 12/30/2018 READMISSION This is a 69 year old woman with chronic paraplegia. I picked her up for her care from Dr. Con Memos in this clinic after he departed. She had a stage IV pressure wound over the left ischial tuberosity. She was treated twice for her underlying osteomyelitis and this I believe firstly in 2016 and again in 2017. There were some plans at some point for flap closure of this however she was discovered to have uncontrolled diabetes and I do not think this was ever accomplished. She ultimately healed over in this clinic and was discharged in May. She has a large cone-shaped indentation with the tip of this going towards the left ischial tuberosity. It is not an easy area to examine but at that time I thought all of this was epithelialized. Apparently there was a reopening here shortly after she left the clinic last time. She was admitted to hospital at the end of June for Klebsiella bacteremia felt to be secondary to UTI. A CT scan of the pelvis is listed below and there was initially some concern that she had underlying osteomyelitis although I believe she was seen by infectious disease and that was felt to be not the case: I do not see any new cultures or inflammatory markers IMPRESSION: 1. No CT evidence for acute intra-abdominal or pelvic abnormality. Large volume of stool throughout the colon. 2. Enlarged fatty liver with fat sparing near the gallbladder fossa 3. Cortical scarring right kidney. Bilateral intrarenal stones without hydronephrosis. Thick-walled urinary bladder decompressed by suprapubic catheter 4. Deep left decubitus ulcer with underlying left ischial changes suggesting osteomyelitis. Her husband has been using silver collagen in the wound. She has not been systemically unwell no fever chills eating and drinking well. They rigorously offload this wound only getting up in the wheelchair when she is going to appointments the rest of the time she is in  bed. 10/8; wound measures larger and she now has exposed bone. We have been using silver alginate 11/12 still using silver alginate. The patient saw Dr. Megan Salon of infectious disease. She was started on Augmentin 500 mg twice daily. She is due to follow-up with Dr. Megan Salon I believe next week. Lab work Dr. Megan Salon requested showed a sedimentation rate of 28 and CRP of 20 although her CRP 1 year ago was 18.8.  Sedimentation rate 1 year ago was 11 basic metabolic panel showed a creatinine of 1.12 12/3; the patient followed up with Dr. Megan Salon yesterday. She is still on Augmentin twice daily. This was directed by Dr. Megan Salon. The patient's inflammatory markers have improved which is gratifying. Her C-reactive protein was repeated yesterday and follow-up booked with infectious disease in January. In addition I have been getting secure text messages I think from palliative care through [facility] [employer]. I think they were hoping to provide services to the patient in her home. They could not get a hold of the primary physician and so they reached out to me on 2 separate occasions. 12/17; patient last saw Dr. Megan Salon on 12/2. She is finishing up with Augmentin. Her C-reactive protein was 20 on 10/21, 10.1 on 11/19 and 17 on 12/2. The wound itself still has depth and undermining. We are using Santyl with the backing wet-to-dry 04/27/2019. The wound is gradually clearing up in terms of the surface although it is not filled in that much. Still abuts right against bone 2/4; patient with a deep pressure ulcer over the left ischial tuberosity. I thought she was going to follow-up with infectious disease to follow her inflammatory markers although the patient states that they stated that they did not need to see her unless we felt it was necessary. I will need to check their notes. In any case we ordered moistened silver collagen back with wet-to-dry to fill in the depth of the wound  although apparently [facility] sent silver alginate which they have been using since they were here the last time. Is obviously not what we ordered. 2/25. Not much change in this wound it is over the left ischial tuberosity recurrent wound. We have been using silver collagen with backing wet-to-dry. I think the wound is about the same. There is still some tunneling from about 10-12 o'clock over the ischial tuberosity itself 3/11; pressure ulcer over the left ischial tuberosity. Since she was last here the wound VAC was started and apparently going quite well. We are able to get the home health company that accepts [place] healthcare which is in itself sometimes problematic. There is been improvements in the wound the tunneling seems to be better and is contracted nicely 4/8; 1 month follow-up. Since she was last here we have been using silver collagen under a wound VAC. Some minor contraction I think in wound volume. She is cared for diligently by her husband including pressure relief, incontinence management, nutritional support etc. 6/1; this is almost a 44-monthfollow-up. She is been using silver collagen under wound VAC. Circular area over the left ischial tuberosity. She has been using silver collagen under wound VAC 7/8; 1 month follow-up. Silver collagen under the VAC not really a lot of progress. Tissue at the base of the wound which is right against bone and the tissue next that this does not look completely viable. She is not currently on any antibiotics, she had underlying osteomyelitis I need to look this over 8/16; we are using silver collagen under wound VAC to the left ischial tuberosity wound. Comes in today with absolutely no change in surface area or depth. There is no exposed bone. I did look over her infectious disease notes as I said I would do last time. She last saw Dr. CMegan Salonin December 2020. She completed 6 weeks of Augmentin. This was in response to a bone culture I did showing  methicillin susceptible staph aureus and Enterococcus.  She was supposed to come back to see Dr. Megan Salon at some point although they say that that appointment was canceled unless I chose to recommend return. I think there was supposed to be follow-up with inflammatory markers but I cannot see that that was ever done. She has not been on antibiotics since Electronic Signature(s) Signed: 11/21/2019 5:53:04 PM By: Linton Ham MD Entered By: Linton Ham on 11/21/2019 12:46:16 -------------------------------------------------------------------------------- Physical Exam Details Patient Name: Date of Service: [name], Mykah 11/21/2019 10:00 A M Medical Record Number: [id number] Patient Account Number: [id number] Date of Birth/Sex: Treating RN: Jul 29, 1950 (69 y.o. [name] Primary Care Provider: [name] Other Clinician: Referring Provider: Treating Provider/Extender: Tommy Rainwater in Treatment: 46 Constitutional Sitting or standing Blood Pressure is within target range for patient.. Pulse regular and within target range for patient.Marland Kitchen Respirations regular, non-labored and within target range.. Temperature is normal and within the target range for the patient.Marland Kitchen Appears in no distress. Respiratory work of breathing is normal. Lymphatic . Integumentary (Hair, Skin) No erythema around the wound. Psychiatric appears at normal baseline. Notes Wound exam; left ischial tuberosity. The wound itself has not changed in volume. This is right against bone but does not have exposed bone. There is no soft tissue infection no soft tissue crepitus no erythema around the wound. Nevertheless the dimensions of it have not come in at all. Electronic Signature(s) Signed: 11/21/2019 5:53:04 PM By: Linton Ham MD Entered By: Linton Ham on 11/21/2019 12:47:27 -------------------------------------------------------------------------------- Physician Orders  Details Patient Name: Date of Service: [name], Shaelee 11/21/2019 10:00 [place] Record Number: [id number] Patient Account Number: [id number] Date of Birth/Sex: Treating RN: 1951-02-04 (69 y.o. [name] Primary Care Provider: [name] Other Clinician: Referring Provider: Treating Provider/Extender: Tommy Rainwater in Treatment: 37 Verbal / Phone Orders: No Diagnosis Coding ICD-10 Coding Code Description [phone] Pressure ulcer of left buttock, stage 4 G82.20 Paraplegia, unspecified E11.622 Type 2 diabetes mellitus with other skin ulcer M86.68 Other chronic osteomyelitis, other site Follow-up Appointments Return appointment in 1 month. Dressing Change Frequency Wound #12 Left Ischial Tuberosity Change Dressing every other day. Skin Barriers/Peri-Wound Care Skin Prep - to periwound Wound Cleansing Wound #12 Left Ischial Tuberosity Clean wound with Normal Saline. - or wound cleanser Primary Wound Dressing Wound #12 Left Ischial Tuberosity Silver Collagen - saline moistened gauze over collagen Other: - Discontinue wound vac Secondary Dressing Wound #12 Left Ischial Tuberosity Foam Border - or ABD pad and tape Off-Loading Low air-loss mattress (Group 2) Gel wheelchair cushion Turn and reposition every 2 hours [facility] for wound care. - [facility] CBC W A Differential panel in Blood (HEM-CBC) - Home health to obtain - (ICD10 M86.68 - Other chronic osteomyelitis, other site) uto LOINC Code: 367 114 7231 Convenience Name: CBC W Auto Differential panel C reactive protein [Mass/volume] in Serum or Plasma (CHEM) - Home health to obtain - (ICD10 M86.68 - Other chronic osteomyelitis, other site) LOINC Code: 1988-5 Convenience Name: C Reactive Protein in serum or plasma Erythrocyte sedimentation rate (HEM) - Home health to obtain - (ICD10 M86.68 - Other chronic osteomyelitis, other site) LOINC Code:  47096-2 Convenience Name: Sed rate-method unspecified Electronic Signature(s) Signed: 11/21/2019 5:14:32 PM By: Levan Hurst RN, BSN Signed: 11/21/2019 5:53:04 PM By: Linton Ham MD Entered By: Levan Hurst on 11/21/2019 11:26:07 -------------------------------------------------------------------------------- Problem List Details Patient Name: Date of Service: [name], Geeta 11/21/2019 10:00 [facility] Record Number: [id number]  Patient Account Number: [id number] Date of Birth/Sex: Treating RN: April 09, 1950 (69 y.o. [name] Primary Care Provider: [name] Other Clinician: Referring Provider: Treating Provider/Extender: Tommy Rainwater in Treatment: 72 Active Problems ICD-10 Encounter Code Description Active Date MDM Diagnosis 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, stage 4 12/30/2018 No Yes G82.20 Paraplegia, unspecified 12/30/2018 No Yes E11.622 Type 2 diabetes mellitus with other skin ulcer 12/30/2018 No Yes M86.68 Other chronic osteomyelitis, other site 02/17/2019 No Yes Inactive Problems Resolved Problems Electronic Signature(s) Signed: 11/21/2019 5:53:04 PM By: Linton Ham MD Entered By: Linton Ham on 11/21/2019 12:43:49 -------------------------------------------------------------------------------- Progress Note Details Patient Name: Date of Service: [name], Auna 11/21/2019 10:00 [place] Record Number: [id number] Patient Account Number: [id number] Date of Birth/Sex: Treating RN: 12-12-1950 (69 y.o. [name] Primary Care Provider: Other Clinician: [name] Referring Provider: Treating Provider/Extender: Tommy Rainwater in Treatment: 46 Subjective History of Present Illness (HPI) The following HPI elements were documented for the patient's wound: Location: open ulceration of the left gluteal area, left heel and right ankle for about 5 months. Quality: Patient reports No Pain. Severity: Patient  states wound(s) are getting worse. Duration: Patient has had the wound for > 5 months prior to seeking treatment at the wound center Context: The wound occurred when the patient has been paraplegic for about 3 years. Modifying Factors: Wound improving due to current treatment. Associated Signs and Symptoms: Patient reports having foul odor. this 69 year old patient who is known to have hypertension, hypothyroidism, breast cancer, chronic pain syndrome, paraplegia was noted to have a left gluteal decubitus ulcer and was brought into the hospital. During the course of her hospitalization she was debrided in the operating room by Dr. Leland Johns and had all the wounds sharply debrided. The debridement was done for the left ischial wound, the left heel wound and the right ankle wound. Bone cultures were taken at that time but were negative but clinically she was treated for osteomyelitis because of the probing down to bone and open exposed bone. Home health has been giving her antibioticss which include vancomycin and Zosyn. The patient was a smoker until about 3 weeks ago and used to smoke about 10 cigarettes a day for a long while. 12/13/2014 - details of her operative note from 11/03/2014 were reviewed -- PROCEDURE: 1. Excisional debridement skin, subcutaneous, muscle left ischium 35 cm2 2. Excisional debridement skin, subcutaneous tissue left heel 27 cm2 3. Excisional debridement right ankle skin, subcutaneous, bone 30 cm2 01/24/2015 -- she has some issues with her wheelchair cushion but other than that is doing very well and has received Podus boots for her feet. 02/14/2015 -- she was using her old offloading boots and this seemed to have caused her a new pressure ulcer on the left posterior heel near the superior part just below the Achilles tendon. 03/07/2015 -- she has a new ulceration just to the left of the midline on her sacral region more on the left buttock and this has been there for about a  week. 08/22/2015 -- was recently admitted to hospital between May 5 and 08/13/2015, with sepsis and leukocytosis due to a UTI. she was treated for a sepsis complicating Escherichia coli UTI and kidney stones. She also had metabolic and careful up at the secondary to pyelonephritis. He received broad-spectrum antibiotics initially and then received Macrobid as per urology. She was sent home on nitrofurantoin. during her admission she had a CT scan which showed exposed left ischial  tuberosity without evidence of osteolysis. 09/12/2015-- the patient is having some issues with her air mattress and would like to get a opinion from medical modalities. 10/10/2015 -- the issue with her air mattress has not yet been sorted out and the new problem seems to be a lot of odor from the wound VAC. 11/27/2015 -- the patient was admitted to the hospital between July 23 and 10/31/2015. Her problems were sepsis, osteomyelitis of the pelvic bone and acute pyelonephritis. CT of the abdomen and pelvis was consistent with a left-sided pyelonephritis with hydronephrosis and also just showed new sclerosis of the posterior portion of the left anterior pubic ramus suggestive of periosteal reaction consistent with osteomyelitis. She was treated for the osteomyelitis with infectious disease consult recommending 6 weeks of IV antibiotics including vancomycin and Rocephin and the antibiotics were to go on until 12/10/2015. He was seen by Dr. Iran Planas plastic surgery and Dr. Linus Salmons of infectious disease. She had a suprapubic catheter placed during the admission. CT scan done on 10/28/2015 showed specifically -- New sclerosis of the posterior portion of the left inferior pubic ramus with aggressive periosteal reaction, consistent with osteomyelitis, with adjacent soft tissue gas compatible with previously described decubitus ulcer. 12/12/2015 -- she was recently seen by Dr. Linus Salmons, who noted good improvement and CRP and ESR compared to  before and he has stopped her antibiotic as per plans to finish on September 4. The patient was encouraged to continue with wound care and consider hyperbaric oxygen therapy. Today she tells me that she has consented to undergo hyperbaric oxygen therapy and we can start the paperwork. 01/02/2016 -- her PCP had gained about 3 years but she still persists in having problems during hyperbaric oxygen therapy with some discomfort in the ears. 01/09/16; pressure area with underlying osteomyelitis in the left buttock. Wound bed itself has some slight amount of grayish surface slough however I do not think any debridement was necessary. There is no exposed bone soft tissue appears stable. She is using a wound VAC 01/16/16; back for weekly wound review in conjunction with HBO. She has a deep wound over the left initial tuberosity previously treated with 6 weeks of IV antibiotics for osteomyelitis. Wound bed looks reasonably healthy although the base of this is still precariously close to bone. She has been using a wound VAC. 01/23/2016 -- she has completed her course of antibiotics and this week the only new thing is her right great toe nail was avulsed and she has got an open wound over the nailbed. 01/31/16 she has completed her course of antibiotics. Her right great toenail avulsed last week and she's been using silver alginate for this as well. Still using a wound VAC to the substantial stage IV wound over the left ischial tuberosity 03/05/2016 -- the patient has had a opinion from the plastic surgery group at [facility] and details of this are not available yet but the patient's verbal report has been heard by me. Did not sound like there was any optimistic discussion regarding reconstruction and the net result would be to continue with the wound VAC application. I will await the official reports. Addendum: -- she was seen at [facility] surgery service by Dr. Tressa Busman. After a  thorough review and from what I understand spending 45 minutes with the patient his assessment has been noted by me in detail and the management options were: 1. Continued pressure offloading and wound care versus operative procedures including wound excision 2.  Soft tissue and bone sampling 3. If the wound gets larger wound closure would be done using a variety of plastic surgical techniques including but not limited to skin substitute, possible skin graft, local versus regional flaps, negative pressure dressing application. 4. He discussed with her details of flap surgery and the risks associated 5. He made a comment that since the patient was operated on by Dr. Leland Johns of [facility] plastic surgery unit in [place] the patient may continue to follow-up there for further evaluation for surgical flap closure in the future. 03/19/2016 -- the patient continues to be rather depressed and frustrated with her lack of rapid progress in healing this wound especially because she thought after hyperbaric oxygen therapy the wound would heal extremely fast. She now understands that was not the implied benefit on wound care which was the recommendation for hyperbaric oxygen therapy. I have had a lengthy discussion with the patient and her husband regarding her options: 1. Continue with collagen and wound VAC for the primary dressing and offloading and all supportive care. 2. See Dr. Iran Planas for possible placement of Acell or Integra in the OR. 3. get a second opinion from a wound care center and surrounding regions/counties 05/07/2016 -- Note from Dr. Celedonio Miyamoto, who noted that the patient has declined flap surgery. She has discussed application of A cell, and try a few applications to see how the wound progresses. She is also recommended that we could apply products here in the wound center, like Oasis. during her preop workup it was found that her hemoglobin A1c was 11% and she has now  been diagnosed as having diabetes mellitus and has been put on appropriate treatment by her PCP 05/28/2016 -- tells me her blood sugars have been doing well and she has an appointment to see her PCP in the next couple of weeks to check her hemoglobin A1c. Other than that she continues to do well. 06/25/2016 -- have not seen her back for the last month but she says her health has been about the same and she has an appointment to check the A1c next week 09/10/16 ---- was seen by Dr. Celedonio Miyamoto -- who applied Acell and saw her back in follow-up. She has recommended silver alginate to the wound every other day and cover with foam. If no significant drainage could transition to collagen every other day. She recommended discontinuing wound VAC. There were no plans to repeat application of Acell. The patient expressed that her husband could do the wound care as going to the Wound Ctr., would cost several $100 for each visit. 10/21/2016 -- her insurance company is getting her new mattress and she is pleased about that. Other than that she has been doing dressings with PolyMem Silver and has been doing very well 02/18/2017 -- she has gone through several changes of her mattress and has not been pleased with any of them. The ventricles are still working on trying to fit her with the appropriate low air-loss mattress. She has a new wound on the gluteal area which is clearly separated from the original wound. 03/25/17-she is here in follow-up evaluation for her left ischialpressure ulcer. She remains unsatisfied with her pressure mattress. She admits to sitting multiple hours a day, in the bed. We have discussed offloading options. The wound does not appear infected. Nutrition does not appear to be a concern. Will follow-up in 4 weeks, if wound continues to be stalled may consider x-ray to evaluate for refractory osteomyelitis. 04/21/17;  this is a patient that I don't know all that well. She has a chronic  wound which at one point had underlying osteomyelitis in the left ischial tuberosity. This is a stage IV pressure ulcer. Over the last 3 months she has a stage II wound inferiorly to the original wound. The last time she was here her dressing was changed to silver collagen although the patient's husband who changes the dressing said that the collagen stuck to the wound and remove skin from the superficial area therefore he switched back to [place] 05/13/17; this is a patient we've been following for a left ischial tuberosity wound which was stage IV at one point had underlying osteomyelitis. Over the last several months she's had a stage II wound just inferior and medial to the related to the wound. According to her husband he is using Endoform layer with collagen although this is not what I had last time. According to her husband they are using Elgie Congo with collagen although I don't quite know how that started. She was hospitalized from 1/20 through 04/30/16. This was related to a UTI. Her blood cultures were negative, urine culture showed multiple species. She did have a CT scan of the abdomen and pelvis which documented chronic osteomyelitis in the area of the wound inflammatory markers were unremarkable. She has had prior knowledge of osteomyelitis. It looks as though she received IV antibiotics in 2017 and was treated with a course of hyperbaric oxygen. 05/28/17; the wound over the left ischial tuberosity is deeper today and abuts clearly on bone. Nursing intake reported drainage. I therefore culture of the wound. The more superficial area just below this looks about the same. They once again complained that there are mattress cover is not working although apparently [facility] is been noted to see this many times in the report is that the device is functional 06/18/17; the patient had a probing area on the left ischial tuberosity that was draining purulent fluid last time. This also  clearly seemed to have open bone. Culture I did showed pansensitive pseudomonas including third generation cephalosporins. I treated this with cefdinir 300 twice a day for 10 days and things seem to have improved. She has a more superficial wound just underneath this area. Amazingly she has a new air mattress through [facility]. I think they gave this to her as a parking give. In any case this now works according to the patient may have something to do with why the areas are looking better. 07/09/17; the patient has a probing area in the left ischial tuberosity that still has some depth. However this is contracted in terms of the wound orifice although the depth is still roughly the same. There is no undermining. ooShe also has the satellite wound which is more superficial. This appears to have a healthy surface we've been using silver collagen 08/06/17; the patient's wound is over the left ischial tuberosity and a satellite lesion just underneath this. The original wound was actually a deep stage 4 wound. We have made good progress in 2 months and there is no longer exposed bone here. 09/03/17; left ischial tuberosity actually appears to be quite healthy. I think we are making progress. No debridement is required. There is no surrounding erythema 10/01/17 I follow this patient monthly for her left ischial tuberosity wound. There is 2 areas the original area and a satellite area. The satellite area looks a lot better there is no surrounding erythema. Her husband relates  that he is having trouble maintaining the dressing. This has to do with the soft tissue around it. He states he puts the collagen in but he cannot make sure that it stays in even with the ABD pads and tape that he is been using 10/29/17; patient arrives with a better looking noon today. Some of the satellite lesions have closed. using Prisma 11/26/17; the patient has a large cone-shaped area with the tip of the Cone deep within her  buttock soft tissue. The walls of the Cone are epithelialized however the base is still open. The area at the base of this looks moist we've been using silver collagen. Will change to silver alginate 12/31/2017; the wound appears to have come in fairly nicely. Using silver alginate. There is no surrounding maceration or infection 01/28/18; there is still an open area here over the left initial tuberosity. Base of this however looks healthy. There is no surrounding infection 02/25/18; the area of its open is over the left ischial tuberosity. The base of this is where the wound is. This is a large inverted cone-shaped area with the wound at the tip. Dimensions of the wound at the tip are improved. There is a area of denuded skin about halfway towards the tip which her husband thinks may have happened today when he was bathing her. 04/20/17; the area is still open over the left initial tuberosity. 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 This is now completely closed over although it is closed over with a divot and skin over bone at the base of this. Continued aggressive offloading will be necessary. 12/30/2018 READMISSION This is a 69 year old woman with chronic paraplegia. I picked her up for her care from Dr. Con Memos in this clinic after he departed. She had a stage IV pressure wound over the left ischial tuberosity. She was treated twice for her underlying osteomyelitis and this I believe firstly in 2016 and again in 2017. There were some plans at some point for flap closure of this however she was discovered to have uncontrolled diabetes and I do not think this was ever accomplished. She ultimately healed over in this clinic and was discharged in May. She has a large cone-shaped indentation with the tip of  this going towards the left ischial tuberosity. It is not an easy area to examine but at that time I thought all of this was epithelialized. Apparently there was a reopening here shortly after she left the clinic last time. She was admitted to hospital at the end of June for Klebsiella bacteremia felt to be secondary to UTI. A CT scan of the pelvis is listed below and there was initially some concern that she had underlying osteomyelitis although I believe she was seen by infectious disease and that was felt to be not the case: I do not see any new cultures or inflammatory markers IMPRESSION: 1. No CT evidence for acute intra-abdominal or pelvic abnormality. Large volume of stool throughout the colon. 2. Enlarged fatty liver with fat sparing near the gallbladder fossa 3. Cortical scarring right kidney. Bilateral intrarenal stones without hydronephrosis. Thick-walled urinary bladder decompressed by suprapubic catheter 4. Deep left decubitus ulcer with underlying left ischial changes suggesting osteomyelitis. Her husband has been using silver collagen in the wound. She has not been systemically  unwell no fever chills eating and drinking well. They rigorously offload this wound only getting up in the wheelchair when she is going to appointments the rest of the time she is in bed. 10/8; wound measures larger and she now has exposed bone. We have been using silver alginate 11/12 still using silver alginate. The patient saw Dr. Megan Salon of infectious disease. She was started on Augmentin 500 mg twice daily. She is due to follow-up with Dr. Megan Salon I believe next week. Lab work Dr. Megan Salon requested showed a sedimentation rate of 28 and CRP of 20 although her CRP 1 year ago was 18.8. Sedimentation rate 1 year ago was 11 basic metabolic panel showed a creatinine of 1.12 12/3; the patient followed up with Dr. Megan Salon yesterday. She is still on Augmentin twice daily. This was directed by Dr. Megan Salon.  The patient's inflammatory markers have improved which is gratifying. Her C-reactive protein was repeated yesterday and follow-up booked with infectious disease in January. In addition I have been getting secure text messages I think from palliative care through [facility] [employer]. I think they were hoping to provide services to the patient in her home. They could not get a hold of the primary physician and so they reached out to me on 2 separate occasions. 12/17; patient last saw Dr. Megan Salon on 12/2. She is finishing up with Augmentin. Her C-reactive protein was 20 on 10/21, 10.1 on 11/19 and 17 on 12/2. The wound itself still has depth and undermining. We are using Santyl with the backing wet-to-dry 04/27/2019. The wound is gradually clearing up in terms of the surface although it is not filled in that much. Still abuts right against bone 2/4; patient with a deep pressure ulcer over the left ischial tuberosity. I thought she was going to follow-up with infectious disease to follow her inflammatory markers although the patient states that they stated that they did not need to see her unless we felt it was necessary. I will need to check their notes. In any case we ordered moistened silver collagen back with wet-to-dry to fill in the depth of the wound although apparently [facility] sent silver alginate which they have been using since they were here the last time. Is obviously not what we ordered. 2/25. Not much change in this wound it is over the left ischial tuberosity recurrent wound. We have been using silver collagen with backing wet-to-dry. I think the wound is about the same. There is still some tunneling from about 10-12 o'clock over the ischial tuberosity itself 3/11; pressure ulcer over the left ischial tuberosity. Since she was last here the wound VAC was started and apparently going quite well. We are able to get the home health company that accepts [place] healthcare which  is in itself sometimes problematic. There is been improvements in the wound the tunneling seems to be better and is contracted nicely 4/8; 1 month follow-up. Since she was last here we have been using silver collagen under a wound VAC. Some minor contraction I think in wound volume. She is cared for diligently by her husband including pressure relief, incontinence management, nutritional support etc. 6/1; this is almost a 55-monthfollow-up. She is been using silver collagen under wound VAC. Circular area over the left ischial tuberosity. She has been using silver collagen under wound VAC 7/8; 1 month follow-up. Silver collagen under the VAC not really a lot of progress. Tissue at the base of the wound which is right against bone  and the tissue next that this does not look completely viable. She is not currently on any antibiotics, she had underlying osteomyelitis I need to look this over 8/16; we are using silver collagen under wound VAC to the left ischial tuberosity wound. Comes in today with absolutely no change in surface area or depth. There is no exposed bone. I did look over her infectious disease notes as I said I would do last time. She last saw Dr. Megan Salon in December 2020. She completed 6 weeks of Augmentin. This was in response to a bone culture I did showing methicillin susceptible staph aureus and Enterococcus. She was supposed to come back to see Dr. Megan Salon at some point although they say that that appointment was canceled unless I chose to recommend return. I think there was supposed to be follow-up with inflammatory markers but I cannot see that that was ever done. She has not been on antibiotics since Objective Constitutional Sitting or standing Blood Pressure is within target range for patient.. Pulse regular and within target range for patient.Marland Kitchen Respirations regular, non-labored and within target range.. Temperature is normal and within the target range for the patient.Marland Kitchen  Appears in no distress. Vitals Time Taken: 10:19 AM, Height: 63 in, Weight: 185 lbs, BMI: 32.8, Temperature: 97.8 F, Pulse: 87 bpm, Respiratory Rate: 18 breaths/min, Blood Pressure: 110/64 mmHg. Respiratory work of breathing is normal. Psychiatric appears at normal baseline. General Notes: Wound exam; left ischial tuberosity. The wound itself has not changed in volume. This is right against bone but does not have exposed bone. There is no soft tissue infection no soft tissue crepitus no erythema around the wound. Nevertheless the dimensions of it have not come in at all. Integumentary (Hair, Skin) No erythema around the wound. Wound #12 status is Open. Original cause of wound was Pressure Injury. The wound is located on the Left Ischial Tuberosity. The wound measures 3.7cm length x 1.9cm width x 2.4cm depth; 5.521cm^2 area and 13.251cm^3 volume. There is Fat Layer (Subcutaneous Tissue) Exposed exposed. There is no tunneling or undermining noted. There is a medium amount of serosanguineous drainage noted. The wound margin is well defined and not attached to the wound base. There is large (67-100%) red, pink granulation within the wound bed. There is no necrotic tissue within the wound bed. Assessment Active Problems ICD-10 Pressure ulcer of left buttock, stage 4 Paraplegia, unspecified Type 2 diabetes mellitus with other skin ulcer Other chronic osteomyelitis, other site Plan Follow-up Appointments: Return appointment in 1 month. Dressing Change Frequency: Wound #12 Left Ischial Tuberosity: Change Dressing every other day. Skin Barriers/Peri-Wound Care: Skin Prep - to periwound Wound Cleansing: Wound #12 Left Ischial Tuberosity: Clean wound with Normal Saline. - or wound cleanser Primary Wound Dressing: Wound #12 Left Ischial Tuberosity: Silver Collagen - saline moistened gauze over collagen Other: - Discontinue wound vac Secondary Dressing: Wound #12 Left Ischial  Tuberosity: Foam Border - or ABD pad and tape Off-Loading: Low air-loss mattress (Group 2) Gel wheelchair cushion Turn and reposition every 2 hours [facility]. - [facility] Laboratory ordered were: CBC W Auto Differential panel - Home health to obtain, C Reactive Protein in serum or plasma - Home health to obtain, Sed rate -method unspecified - Home health to obtain #1 left ischial tuberosity. Chronic pressure ulcer with underlying osteomyelitis. I have ordered a follow-up sedimentation rate C-reactive protein CBC with differential. She may need follow-up imaging studies although at this point I have  not ordered these. I have not ordered further cultures 2. The wound itself looks satisfactory although it has not come in at all in the last several months. Certainly not in the last 6 weeks. 3. At this point I am going to discontinue the wound VAC use silver collagen backed with normal saline wet-to-dry. They claim that she is offloading this rigorously. 4. I am going to look at the lab work and decide whether any further imaging or culturing is necessary Electronic Signature(s) Signed: 11/21/2019 5:53:04 PM By: Linton Ham MD Entered By: Linton Ham on 11/21/2019 12:49:04 -------------------------------------------------------------------------------- SuperBill Details Patient Name: Date of Service: [name], Armani 11/21/2019 Medical Record Number: [id number] Patient Account Number: [id number] Date of Birth/Sex: Treating RN: 15-Apr-1950 (69 y.o. [name] Primary Care Provider: [name] Other Clinician: Referring Provider: Treating Provider/Extender: Tommy Rainwater in Treatment: 46 Diagnosis Coding ICD-10 Codes Code Description 5033532337 Pressure ulcer of left buttock, stage 4 G82.20 Paraplegia, unspecified E11.622 Type 2 diabetes mellitus with other skin ulcer M86.68 Other chronic osteomyelitis,  other site Facility Procedures The patient participates with Medicare or their insurance follows the Medicare Facility Guidelines: CPT4 Code Description Modifier Quantity [id number] Christmas VISIT-LEV 3 EST PT 1 Physician Procedures Electronic Signature(s) Signed: 11/21/2019 5:53:04 PM By: Linton Ham MD Entered By: Linton Ham on 11/21/2019 12:49:28

## 2019-11-22 NOTE — Progress Notes (Signed)
[name], [name] ([id number]) Visit Report for 11/21/2019 Arrival Information Details Patient Name: Date of Service: [name], [name] 11/21/2019 10:00 [place] Record Number: [id number] Patient Account Number: [id number] Date of Birth/Sex: Treating RN: 1950/08/13 (69 y.o. [name] Primary Care Reathel Turi: [name] Other Clinician: Referring Tavari Loadholt: Treating Ishia Tenorio/Extender: Tommy Rainwater in Treatment: 46 Visit Information History Since Last Visit Added or deleted any medications: No Patient Arrived: Wheel Chair Any new allergies or adverse reactions: No Arrival Time: 10:18 Had a fall or experienced change in No Accompanied By: husband activities of daily living that may affect Transfer Assistance: Manual risk of falls: Patient Identification Verified: Yes Signs or symptoms of abuse/neglect since last visito No Secondary Verification Process Completed: Yes Hospitalized since last visit: No Patient Requires Transmission-Based Precautions: No Implantable device outside of the clinic excluding No Patient Has Alerts: No cellular tissue based products placed in the center since last visit: Has Dressing in Place as Prescribed: Yes Pain Present Now: No Electronic Signature(s) Signed: 11/21/2019 5:24:06 PM By: Kela Millin Entered By: Kela Millin on 11/21/2019 10:19:33 -------------------------------------------------------------------------------- Clinic Level of Care Assessment Details Patient Name: Date of Service: [name], [name] 11/21/2019 10:00 [place] Record Number: [id number] Patient Account Number: [id number] Date of Birth/Sex: Treating RN: [birth year] (69 y.o. Benjamine Sprague, Briant Cedar Primary Care Ciel Chervenak: [name] Other Clinician: Referring Antino Mayabb: Treating Brandt Chaney/Extender: Tommy Rainwater in Treatment: 46 Clinic Level of Care Assessment Items TOOL 4 Quantity Score X- 1 0 Use when only an EandM [name] performed  on FOLLOW-UP visit ASSESSMENTS - Nursing Assessment / Reassessment X- 1 10 Reassessment of Co-morbidities (includes updates in patient status) X- 1 5 Reassessment of Adherence to Treatment Plan ASSESSMENTS - Wound and Skin A ssessment / Reassessment X - Simple Wound Assessment / Reassessment - one wound 1 5 []  - 0 Complex Wound Assessment / Reassessment - multiple wounds []  - 0 Dermatologic / Skin Assessment (not related to wound area) ASSESSMENTS - Focused Assessment []  - 0 Circumferential Edema Measurements - multi extremities []  - 0 Nutritional Assessment / Counseling / Intervention []  - 0 Lower Extremity Assessment (monofilament, tuning fork, pulses) []  - 0 Peripheral Arterial Disease Assessment (using hand held doppler) ASSESSMENTS - Ostomy and/or Continence Assessment and Care []  - 0 Incontinence Assessment and Management []  - 0 Ostomy Care Assessment and Management (repouching, etc.) PROCESS - Coordination of Care X - Simple Patient / Family Education for ongoing care 1 15 []  - 0 Complex (extensive) Patient / Family Education for ongoing care X- 1 10 Staff obtains Programmer, systems, Records, T Results / Process Orders est X- 1 10 Staff telephones HHA, Nursing Homes / Clarify orders / etc []  - 0 Routine Transfer to another Facility (non-emergent condition) []  - 0 Routine Hospital Admission (non-emergent condition) []  - 0 New Admissions / Biomedical engineer / Ordering NPWT Apligraf, etc. , []  - 0 Emergency Hospital Admission (emergent condition) X- 1 10 Simple Discharge Coordination []  - 0 Complex (extensive) Discharge Coordination PROCESS - Special Needs []  - 0 Pediatric / Minor Patient Management []  - 0 Isolation Patient Management []  - 0 Hearing / Language / Visual special needs []  - 0 Assessment of Community assistance (transportation, D/C planning, etc.) []  - 0 Additional assistance / Altered mentation []  - 0 Support Surface(s) Assessment (bed,  cushion, seat, etc.) INTERVENTIONS - Wound Cleansing / Measurement X - Simple Wound Cleansing - one wound 1 5 []  - 0 Complex Wound Cleansing - multiple wounds  X- 1 5 Wound Imaging (photographs - any number of wounds) []  - 0 Wound Tracing (instead of photographs) X- 1 5 Simple Wound Measurement - one wound []  - 0 Complex Wound Measurement - multiple wounds INTERVENTIONS - Wound Dressings X - Small Wound Dressing one or multiple wounds 1 10 []  - 0 Medium Wound Dressing one or multiple wounds []  - 0 Large Wound Dressing one or multiple wounds X- 1 5 Application of Medications - topical []  - 0 Application of Medications - injection INTERVENTIONS - Miscellaneous []  - 0 External ear exam []  - 0 Specimen Collection (cultures, biopsies, blood, body fluids, etc.) []  - 0 Specimen(s) / Culture(s) sent or taken to Lab for analysis []  - 0 Patient Transfer (multiple staff / Civil Service fast streamer / Similar devices) []  - 0 Simple Staple / Suture removal (25 or less) []  - 0 Complex Staple / Suture removal (26 or more) []  - 0 Hypo / Hyperglycemic Management (close monitor of Blood Glucose) []  - 0 Ankle / Brachial Index (ABI) - do not check if billed separately X- 1 5 Vital Signs Has the patient been seen at the hospital within the last three years: Yes Total Score: 100 Level Of Care: New/Established - Level 3 Electronic Signature(s) Signed: 11/21/2019 5:14:32 PM By: Levan Hurst RN, BSN Entered By: Levan Hurst on 11/21/2019 11:38:45 -------------------------------------------------------------------------------- Encounter Discharge Information Details Patient Name: Date of Service: [name], [name] 11/21/2019 10:00 A M Medical Record Number: [id number] Patient Account Number: [id number] Date of Birth/Sex: Treating RN: Feb 05, 1951 (69 y.o. [name] Primary Care Ernesto Zukowski: [name] Other Clinician: Referring Martia Dalby: Treating Bronsyn Shappell/Extender: Tommy Rainwater  in Treatment: 46 Encounter Discharge Information Items Discharge Condition: Stable Ambulatory Status: Wheelchair Discharge Destination: Home Transportation: Private Auto Accompanied By: spouse Schedule Follow-up Appointment: Yes Clinical Summary of Care: Patient Declined Electronic Signature(s) Signed: 11/22/2019 5:25:51 PM By: Baruch Gouty RN, BSN Entered By: Baruch Gouty on 11/21/2019 11:44:55 -------------------------------------------------------------------------------- Lower Extremity Assessment Details Patient Name: Date of Service: [name], Amariyah 11/21/2019 10:00 [place] Record Number: [id number] Patient Account Number: [id number] Date of Birth/Sex: Treating RN: [birth year] (69 y.o. [name] Primary Care Azariah Latendresse: [name] Other Clinician: Referring Chinaza Rooke: Treating Naydeline Morace/Extender: Tommy Rainwater in Treatment: 47 Electronic Signature(s) Signed: 11/21/2019 5:24:06 PM By: Kela Millin Entered By: Kela Millin on 11/21/2019 10:20:22 -------------------------------------------------------------------------------- Multi Wound Chart Details Patient Name: Date of Service: [name], Keiona 11/21/2019 10:00 A M Medical Record Number: [id number] Patient Account Number: [id number] Date of Birth/Sex: Treating RN: January 05, 1951 (69 y.o. [name] Primary Care Nakiya Rallis: [name] Other Clinician: Referring Dorothey Oetken: Treating Mateja Dier/Extender: Tommy Rainwater in Treatment: 4 Vital Signs Height(in): 63 Pulse(bpm): 72 Weight(lbs): 185 Blood Pressure(mmHg): 110/64 Body Mass Index(BMI): 33 Temperature(F): 97.8 Respiratory Rate(breaths/min): 18 Photos: [12:No Photos Left Ischial Tuberosity] [N/A:N/A N/A] Wound Location: [12:Pressure Injury] [N/A:N/A] Wounding Event: [12:Pressure Ulcer] [N/A:N/A] Primary Etiology: [12:Anemia, Hypertension, Type II] [N/A:N/A] Comorbid History: [12:Diabetes, Osteoarthritis,  Dementia, Paraplegia, Received Radiation 09/06/2018] [N/A:N/A] Date Acquired: [12:46] [N/A:N/A] Weeks of Treatment: [12:Open] [N/A:N/A] Wound Status: [12:3.7x1.9x2.4] [N/A:N/A] Measurements L x W x D (cm) [12:5.521] [N/A:N/A] A (cm) : rea [12:13.251] [N/A:N/A] Volume (cm) : [12:-2239.40%] [N/A:N/A] % Reduction in A rea: [12:-6150.50%] [N/A:N/A] % Reduction in Volume: [12:Category/Stage IV] [N/A:N/A] Classification: [12:Medium] [N/A:N/A] Exudate A mount: [12:Serosanguineous] [N/A:N/A] Exudate Type: [12:red, brown] [N/A:N/A] Exudate Color: [12:Well defined, not attached] [N/A:N/A] Wound Margin: [12:Large (67-100%)] [N/A:N/A] Granulation A mount: [12:Red, Pink] [N/A:N/A] Granulation Quality: [12:None Present (  0%)] [N/A:N/A] Necrotic A mount: [12:Fat Layer (Subcutaneous Tissue)] [N/A:N/A] Exposed Structures: [12:Exposed: Yes Fascia: No Tendon: No Muscle: No Joint: No Bone: No Small (1-33%)] [N/A:N/A] Treatment Notes Wound #12 (Left Ischial Tuberosity) 2. Periwound Care Skin Prep 3. Primary Dressing Applied Colle[employer] Other primary dressing (specifiy in notes) 4. Secondary Dressing Foam Border Dressing Notes secondary saline moist gauze to dry Electronic Signature(s) Signed: 11/21/2019 5:14:32 PM By: Levan Hurst RN, BSN Signed: 11/21/2019 5:53:04 PM By: Linton Ham MD Entered By: Linton Ham on 11/21/2019 12:43:57 -------------------------------------------------------------------------------- Multi-Disciplinary Care Plan Details Patient Name: Date of Service: [name], Trine 11/21/2019 10:00 [place] Record Number: [id number] Patient Account Number: [id number] Date of Birth/Sex: Treating RN: November 17, 1950 (69 y.o. [name] Primary Care Jenny Lai: [name] Other Clinician: Referring [place] Hanlon: Treating Koran Seabrook/Extender: Tommy Rainwater in Treatment: 46 Active Inactive Wound/Skin Impairment Nursing Diagnoses: Knowledge deficit related to  ulceration/compromised skin integrity Goals: Patient/caregiver will verbalize understanding of skin care regimen Date Initiated: 12/30/2018 Target Resolution Date: 12/23/2019 Goal Status: Active Interventions: Assess patient/caregiver ability to obtain necessary supplies Assess patient/caregiver ability to perform ulcer/skin care regimen upon admission and as needed Assess ulceration(s) every visit Provide education on ulcer and skin care Treatment Activities: Skin care regimen initiated : 12/30/2018 Topical wound management initiated : 12/30/2018 Notes: Electronic Signature(s) Signed: 11/21/2019 5:14:32 PM By: Levan Hurst RN, BSN Entered By: Levan Hurst on 11/21/2019 11:38:05 -------------------------------------------------------------------------------- Pain Assessment Details Patient Name: Date of Service: [name], Kaelan 11/21/2019 10:00 [place] Record Number: [id number] Patient Account Number: [id number] Date of Birth/Sex: Treating RN: [birth year] (69 y.o. [name] Primary Care Deandrew Hoecker: [name] Other Clinician: Referring Usbaldo Pannone: Treating Kohan Azizi/Extender: Tommy Rainwater in Treatment: 46 Active Problems Location of Pain Severity and Description of Pain Patient Has Paino No Site Locations Pain Management and Medication Current Pain Management: Electronic Signature(s) Signed: 11/21/2019 5:24:06 PM By: Kela Millin Entered By: Kela Millin on 11/21/2019 10:20:11 -------------------------------------------------------------------------------- Patient/Caregiver Education Details Patient Name: Date of Service: [name], Kathlyn 8/16/2021andnbsp10:00 [place] Record Number: [id number] Patient Account Number: [id number] Date of Birth/Gender: Treating RN: 1950/05/31 (69 y.o. [name] Primary Care Physician: [name] Other Clinician: Referring Physician: Treating Physician/Extender: Tommy Rainwater in Treatment: 57 Education Assessment Education Provided To: Patient Education Topics Provided Wound/Skin Impairment: Methods: Explain/Verbal Responses: State content correctly [employer]) Signed: 11/21/2019 5:14:32 PM By: Levan Hurst RN, BSN Entered By: Levan Hurst on 11/21/2019 11:38:16 -------------------------------------------------------------------------------- Wound Assessment Details Patient Name: Date of Service: [name], Yvanna 11/21/2019 10:00 A M Medical Record Number: [id number] Patient Account Number: [id number] Date of Birth/Sex: Treating RN: 30-Mar-1951 (69 y.o. [name] Primary Care French Kendra: [name] Other Clinician: Referring Tarsha Blando: Treating Chantay Whitelock/Extender: Tommy Rainwater in Treatment: 46 Wound Status Wound Number: 12 Primary Pressure Ulcer Etiology: Wound Location: Left Ischial Tuberosity Wound Open Wounding Event: Pressure Injury Status: Date Acquired: 09/06/2018 Comorbid Anemia, Hypertension, Type II Diabetes, Osteoarthritis, Weeks Of Treatment: 46 History: Dementia, Paraplegia, Received Radiation Clustered Wound: No Photos Photo Uploaded By: Mikeal Hawthorne on 11/22/2019 12:12:24 Wound Measurements Length: (cm) 3.7 Width: (cm) 1.9 Depth: (cm) 2.4 Area: (cm) 5.521 Volume: (cm) 13.251 % Reduction in Area: -2239.4% % Reduction in Volume: -6150.5% Epithelialization: Small (1-33%) Tunneling: No Undermining: No Wound Description Classification: Category/Stage IV Wound Margin: Well defined, not attached Exudate Amount: Medium Exudate Type: Serosanguineous Exudate Color: red, brown Foul Odor After Cleansing: No Slough/Fibrino No Wound Bed Granulation  Amount: Large (67-100%) Exposed Structure Granulation Quality: Red, Pink Fascia Exposed: No Necrotic Amount: None Present (0%) Fat Layer (Subcutaneous Tissue) Exposed: Yes Tendon Exposed: No Muscle Exposed: No Joint Exposed:  No Bone Exposed: No Treatment Notes Wound #12 (Left Ischial Tuberosity) 2. Periwound Care Skin Prep 3. Primary Dressing Applied Collegen AG Other primary dressing (specifiy in notes) 4. Secondary Dressing Foam Border Dressing Notes secondary saline moist gauze to dry Electronic Signature(s) Signed: 11/21/2019 5:24:06 PM By: Kela Millin Entered By: Kela Millin on 11/21/2019 10:23:33 -------------------------------------------------------------------------------- Vitals Details Patient Name: Date of Service: [name], Britanni 11/21/2019 10:00 [place] Record Number: [id number] Patient Account Number: [id number] Date of Birth/Sex: Treating RN: 1950/05/30 (69 y.o. [name] Primary Care Daanya Lanphier: [name] Other Clinician: Referring Hamish Banks: Treating Nusaybah Ivie/Extender: Tommy Rainwater in Treatment: 46 Vital Signs Time Taken: 10:19 Temperature (F): 97.8 Height (in): 63 Pulse (bpm): 87 Weight (lbs): 185 Respiratory Rate (breaths/min): 18 Body Mass Index (BMI): 32.8 Blood Pressure (mmHg): 110/64 Reference Range: 80 - 120 mg / dl Electronic Signature(s) Signed: 11/21/2019 5:24:06 PM By: Kela Millin Entered By: Kela Millin on 11/21/2019 10:19:56

## 2019-12-20 ENCOUNTER — Encounter (HOSPITAL_BASED_OUTPATIENT_CLINIC_OR_DEPARTMENT_OTHER): Payer: Medicare Other | Admitting: Internal Medicine

## 2019-12-27 ENCOUNTER — Other Ambulatory Visit: Payer: Self-pay

## 2019-12-27 ENCOUNTER — Encounter (HOSPITAL_BASED_OUTPATIENT_CLINIC_OR_DEPARTMENT_OTHER): Payer: Medicare Other | Attending: Internal Medicine | Admitting: Internal Medicine

## 2019-12-27 DIAGNOSIS — E1169 Type 2 diabetes mellitus with other specified complication: Secondary | ICD-10-CM | POA: Diagnosis not present

## 2019-12-27 DIAGNOSIS — I10 Essential (primary) hypertension: Secondary | ICD-10-CM | POA: Diagnosis not present

## 2019-12-27 DIAGNOSIS — G8220 Paraplegia, unspecified: Secondary | ICD-10-CM | POA: Insufficient documentation

## 2019-12-27 DIAGNOSIS — Z853 Personal history of malignant neoplasm of breast: Secondary | ICD-10-CM | POA: Insufficient documentation

## 2019-12-27 DIAGNOSIS — L89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, stage 4: Secondary | ICD-10-CM | POA: Insufficient documentation

## 2019-12-27 DIAGNOSIS — Z87891 Personal history of nicotine dependence: Secondary | ICD-10-CM | POA: Diagnosis not present

## 2019-12-27 DIAGNOSIS — M8668 Other chronic osteomyelitis, other site: Secondary | ICD-10-CM | POA: Insufficient documentation

## 2019-12-27 NOTE — Progress Notes (Signed)
[name], [name] ([id number]) Visit Report for 12/27/2019 HPI Details Patient Name: Date of Service: [name], WILLETTS 12/27/2019 10:45 A M Medical Record Number: [id number] Patient Account Number: [id number] Date of Birth/Sex: Treating RN: 1950-08-20 (69 y.o. [name] Primary Care Provider: [name] Other Clinician: Referring Provider: Treating Provider/Extender: Tommy Rainwater in Treatment: 51 History of Present Illness Location: open ulceration of the left gluteal area, left heel and right ankle for about 5 months. Quality: Patient reports No Pain. Severity: Patient states wound(s) are getting worse. Duration: Patient has had the wound for > 5 months prior to seeking treatment at the wound center Context: The wound occurred when the patient has been paraplegic for about 3 years. Modifying Factors: Wound improving due to current treatment. ssociated Signs and Symptoms: Patient reports having foul odor. A HPI Description: this 69 year old patient who is known to have hypertension, hypothyroidism, breast cancer, chronic pain syndrome, paraplegia was noted to have a left gluteal decubitus ulcer and was brought into the hospital. During the course of her hospitalization she was debrided in the operating room by Dr. Leland Johns and had all the wounds sharply debrided. The debridement was done for the left ischial wound, the left heel wound and the right ankle wound. Bone cultures were taken at that time but were negative but clinically she was treated for osteomyelitis because of the probing down to bone and open exposed bone. Home health has been giving her antibioticss which include vancomycin and Zosyn. The patient was a smoker until about 3 weeks ago and used to smoke about 10 cigarettes a day for a long while. 12/13/2014 - details of her operative note from 11/03/2014 were reviewed -- PROCEDURE: 1. Excisional debridement skin, subcutaneous, muscle left ischium 35 cm2 2.  Excisional debridement skin, subcutaneous tissue left heel 27 cm2 3. Excisional debridement right ankle skin, subcutaneous, bone 30 cm2 01/24/2015 -- she has some issues with her wheelchair cushion but other than that is doing very well and has received Podus boots for her feet. 02/14/2015 -- she was using her old offloading boots and this seemed to have caused her a new pressure ulcer on the left posterior heel near the superior part just below the Achilles tendon. 03/07/2015 -- she has a new ulceration just to the left of the midline on her sacral region more on the left buttock and this has been there for about a week. 08/22/2015 -- was recently admitted to hospital between May 5 and 08/13/2015, with sepsis and leukocytosis due to a UTI. she was treated for a sepsis complicating Escherichia coli UTI and kidney stones. She also had metabolic and careful up at the secondary to pyelonephritis. He received broad-spectrum antibiotics initially and then received Macrobid as per urology. She was sent home on nitrofurantoin. during her admission she had a CT scan which showed exposed left ischial tuberosity without evidence of osteolysis. 09/12/2015-- the patient is having some issues with her air mattress and would like to get a opinion from medical modalities. 10/10/2015 -- the issue with her air mattress has not yet been sorted out and the new problem seems to be a lot of odor from the wound VAC. 11/27/2015 -- the patient was admitted to the hospital between July 23 and 10/31/2015. Her problems were sepsis, osteomyelitis of the pelvic bone and acute pyelonephritis. CT of the abdomen and pelvis was consistent with a left-sided pyelonephritis with hydronephrosis and also just showed new sclerosis of the posterior portion of the left  anterior pubic ramus suggestive of periosteal reaction consistent with osteomyelitis. She was treated for the osteomyelitis with infectious disease consult recommending 6 weeks  of IV antibiotics including vancomycin and Rocephin and the antibiotics were to go on until 12/10/2015. He was seen by Dr. Iran Planas plastic surgery and Dr. Linus Salmons of infectious disease. She had a suprapubic catheter placed during the admission. CT scan done on 10/28/2015 showed specifically -- New sclerosis of the posterior portion of the left inferior pubic ramus with aggressive periosteal reaction, consistent with osteomyelitis, with adjacent soft tissue gas compatible with previously described decubitus ulcer. 12/12/2015 -- she was recently seen by Dr. Linus Salmons, who noted good improvement and CRP and ESR compared to before and he has stopped her antibiotic as per plans to finish on September 4. The patient was encouraged to continue with wound care and consider hyperbaric oxygen therapy. Today she tells me that she has consented to undergo hyperbaric oxygen therapy and we can start the paperwork. 01/02/2016 -- her PCP had gained about 3 years but she still persists in having problems during hyperbaric oxygen therapy with some discomfort in the ears. 01/09/16; pressure area with underlying osteomyelitis in the left buttock. Wound bed itself has some slight amount of grayish surface slough however I do not think any debridement was necessary. There is no exposed bone soft tissue appears stable. She is using a wound VAC 01/16/16; back for weekly wound review in conjunction with HBO. She has a deep wound over the left initial tuberosity previously treated with 6 weeks of IV antibiotics for osteomyelitis. Wound bed looks reasonably healthy although the base of this is still precariously close to bone. She has been using a wound VAC. 01/23/2016 -- she has completed her course of antibiotics and this week the only new thing is her right great toe nail was avulsed and she has got an open wound over the nailbed. 01/31/16 she has completed her course of antibiotics. Her right great toenail avulsed last week and  she's been using silver alginate for this as well. Still using a wound VAC to the substantial stage IV wound over the left ischial tuberosity 03/05/2016 -- the patient has had a opinion from the plastic surgery group at [facility] and details of this are not available yet but the patient's verbal report has been heard by me. Did not sound like there was any optimistic discussion regarding reconstruction and the net result would be to continue with the wound VAC application. I will await the official reports. Addendum: -- she was seen at [facility] by Dr. Tressa Busman. After a thorough review and from what I understand spending 45 minutes with the patient his assessment has been noted by me in detail and the management options were: 1. Continued pressure offloading and wound care versus operative procedures including wound excision 2. Soft tissue and bone sampling 3. If the wound gets larger wound closure would be done using a variety of plastic surgical techniques including but not limited to skin substitute, possible skin graft, local versus regional flaps, negative pressure dressing application. 4. He discussed with her details of flap surgery and the risks associated 5. He made a comment that since the patient was operated on by Dr. Leland Johns of [facility] plastic surgery unit in [place] the patient may continue to follow-up there for further evaluation for surgical flap closure in the future. 03/19/2016 -- the patient continues to be rather depressed and frustrated with her  lack of rapid progress in healing this wound especially because she thought after hyperbaric oxygen therapy the wound would heal extremely fast. She now understands that was not the implied benefit on wound care which was the recommendation for hyperbaric oxygen therapy. I have had a lengthy discussion with the patient and her husband regarding her options: 1. Continue  with collagen and wound VAC for the primary dressing and offloading and all supportive care. 2. See Dr. Iran Planas for possible placement of Acell or Integra in the OR. 3. get a second opinion from a wound care center and surrounding regions/counties 05/07/2016 -- Note from Dr. Celedonio Miyamoto, who noted that the patient has declined flap surgery. She has discussed application of A cell, and try a few applications to see how the wound progresses. She is also recommended that we could apply products here in the wound center, like Oasis. during her preop workup it was found that her hemoglobin A1c was 11% and she has now been diagnosed as having diabetes mellitus and has been put on appropriate treatment by her PCP 05/28/2016 -- tells me her blood sugars have been doing well and she has an appointment to see her PCP in the next couple of weeks to check her hemoglobin A1c. Other than that she continues to do well. 06/25/2016 -- have not seen her back for the last month but she says her health has been about the same and she has an appointment to check the A1c next week 09/10/16 ---- was seen by Dr. Celedonio Miyamoto -- who applied Acell and saw her back in follow-up. She has recommended silver alginate to the wound every other day and cover with foam. If no significant drainage could transition to collagen every other day. She recommended discontinuing wound VAC. There were no plans to repeat application of Acell. The patient expressed that her husband could do the wound care as going to the Wound Ctr., would cost several $100 for each visit. 10/21/2016 -- her insurance company is getting her new mattress and she is pleased about that. Other than that she has been doing dressings with PolyMem Silver and has been doing very well 02/18/2017 -- she has gone through several changes of her mattress and has not been pleased with any of them. The ventricles are still working on trying to fit her with the  appropriate low air-loss mattress. She has a new wound on the gluteal area which is clearly separated from the original wound. 03/25/17-she is here in follow-up evaluation for her left ischialpressure ulcer. She remains unsatisfied with her pressure mattress. She admits to sitting multiple hours a day, in the bed. We have discussed offloading options. The wound does not appear infected. Nutrition does not appear to be a concern. Will follow-up in 4 weeks, if wound continues to be stalled may consider x-ray to evaluate for refractory osteomyelitis. 04/21/17; this is a patient that I don't know all that well. She has a chronic wound which at one point had underlying osteomyelitis in the left ischial tuberosity. This is a stage IV pressure ulcer. Over the last 3 months she has a stage II wound inferiorly to the original wound. The last time she was here her dressing was changed to silver collagen although the patient's husband who changes the dressing said that the collagen stuck to the wound and remove skin from the superficial area therefore he switched back to [place] 05/13/17; this is a patient we've been following for a left ischial tuberosity  wound which was stage IV at one point had underlying osteomyelitis. Over the last several months she's had a stage II wound just inferior and medial to the related to the wound. According to her husband he is using Endoform layer with collagen although this is not what I had last time. According to her husband they are using Elgie [place] with collagen although I don't quite know how that started. She was hospitalized from 1/20 through 04/30/16. This was related to a UTI. Her blood cultures were negative, urine culture showed multiple species. She did have a CT scan of the abdomen and pelvis which documented chronic osteomyelitis in the area of the wound inflammatory markers were unremarkable. She has had prior knowledge of osteomyelitis. It looks as though she  received IV antibiotics in 2017 and was treated with a course of hyperbaric oxygen. 05/28/17; the wound over the left ischial tuberosity is deeper today and abuts clearly on bone. Nursing intake reported drainage. I therefore culture of the wound. The more superficial area just below this looks about the same. They once again complained that there are mattress cover is not working although apparently [facility] is been noted to see this many times in the report is that the device is functional 06/18/17; the patient had a probing area on the left ischial tuberosity that was draining purulent fluid last time. This also clearly seemed to have open bone. Culture I did showed pansensitive pseudomonas including third generation cephalosporins. I treated this with cefdinir 300 twice a day for 10 days and things seem to have improved. She has a more superficial wound just underneath this area. Amazingly she has a new air mattress through [facility]. I think they gave this to her as a parking give. In any case this now works according to the patient may have something to do with why the areas are looking better. 07/09/17; the patient has a probing area in the left ischial tuberosity that still has some depth. However this is contracted in terms of the wound orifice although the depth is still roughly the same. There is no undermining. She also has the satellite wound which is more superficial. This appears to have a healthy surface we've been using silver collagen 08/06/17; the patient's wound is over the left ischial tuberosity and a satellite lesion just underneath this. The original wound was actually a deep stage 4 wound. We have made good progress in 2 months and there is no longer exposed bone here. 09/03/17; left ischial tuberosity actually appears to be quite healthy. I think we are making progress. No debridement is required. There is no surrounding erythema 10/01/17 I follow this patient  monthly for her left ischial tuberosity wound. There is 2 areas the original area and a satellite area. The satellite area looks a lot better there is no surrounding erythema. Her husband relates that he is having trouble maintaining the dressing. This has to do with the soft tissue around it. He states he puts the collagen in but he cannot make sure that it stays in even with the ABD pads and tape that he is been using 10/29/17; patient arrives with a better looking noon today. Some of the satellite lesions have closed. using Prisma 11/26/17; the patient has a large cone-shaped area with the tip of the Cone deep within her buttock soft tissue. The walls of the Cone are epithelialized however the base is still open. The area at the base of this looks moist we've  been using silver collagen. Will change to silver alginate 12/31/2017; the wound appears to have come in fairly nicely. Using silver alginate. There is no surrounding maceration or infection 01/28/18; there is still an open area here over the left initial tuberosity. Base of this however looks healthy. There is no surrounding infection 02/25/18; the area of its open is over the left ischial tuberosity. The base of this is where the wound is. This is a large inverted cone-shaped area with the wound at the tip. Dimensions of the wound at the tip are improved. There is a area of denuded skin about halfway towards the tip which her husband thinks may have happened today when he was bathing her. 04/20/17; the area is still open over the left initial tuberosity. 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 This is now completely closed over although it is closed over with a divot and skin over bone at the base of this. Continued aggressive offloading will be  necessary. 12/30/2018 READMISSION This is a 69 year old woman with chronic paraplegia. I picked her up for her care from Dr. Con Memos in this clinic after he departed. She had a stage IV pressure wound over the left ischial tuberosity. She was treated twice for her underlying osteomyelitis and this I believe firstly in 2016 and again in 2017. There were some plans at some point for flap closure of this however she was discovered to have uncontrolled diabetes and I do not think this was ever accomplished. She ultimately healed over in this clinic and was discharged in May. She has a large cone-shaped indentation with the tip of this going towards the left ischial tuberosity. It is not an easy area to examine but at that time I thought all of this was epithelialized. Apparently there was a reopening here shortly after she left the clinic last time. She was admitted to hospital at the end of June for Klebsiella bacteremia felt to be secondary to UTI. A CT scan of the pelvis is listed below and there was initially some concern that she had underlying osteomyelitis although I believe she was seen by infectious disease and that was felt to be not the case: I do not see any new cultures or inflammatory markers IMPRESSION: 1. No CT evidence for acute intra-abdominal or pelvic abnormality. Large volume of stool throughout the colon. 2. Enlarged fatty liver with fat sparing near the gallbladder fossa 3. Cortical scarring right kidney. Bilateral intrarenal stones without hydronephrosis. Thick-walled urinary bladder decompressed by suprapubic catheter 4. Deep left decubitus ulcer with underlying left ischial changes suggesting osteomyelitis. Her husband has been using silver collagen in the wound. She has not been systemically unwell no fever chills eating and drinking well. They rigorously offload this wound only getting up in the wheelchair when she is going to appointments the rest of the time she is in  bed. 10/8; wound measures larger and she now has exposed bone. We have been using silver alginate 11/12 still using silver alginate. The patient saw Dr. Megan Salon of infectious disease. She was started on Augmentin 500 mg twice daily. She is due to follow-up with Dr. Megan Salon I believe next week. Lab work Dr. Megan Salon requested showed a sedimentation rate of 28 and CRP of 20 although her CRP 1 year ago was 18.8.  Sedimentation rate 1 year ago was 11 basic metabolic panel showed a creatinine of 1.12 12/3; the patient followed up with Dr. Megan Salon yesterday. She is still on Augmentin twice daily. This was directed by Dr. Megan Salon. The patient's inflammatory markers have improved which is gratifying. Her C-reactive protein was repeated yesterday and follow-up booked with infectious disease in January. In addition I have been getting secure text messages I think from palliative care through [facility] [employer]. I think they were hoping to provide services to the patient in her home. They could not get a hold of the primary physician and so they reached out to me on 2 separate occasions. 12/17; patient last saw Dr. Megan Salon on 12/2. She is finishing up with Augmentin. Her C-reactive protein was 20 on 10/21, 10.1 on 11/19 and 17 on 12/2. The wound itself still has depth and undermining. We are using Santyl with the backing wet-to-dry 04/27/2019. The wound is gradually clearing up in terms of the surface although it is not filled in that much. Still abuts right against bone 2/4; patient with a deep pressure ulcer over the left ischial tuberosity. I thought she was going to follow-up with infectious disease to follow her inflammatory markers although the patient states that they stated that they did not need to see her unless we felt it was necessary. I will need to check their notes. In any case we ordered moistened silver collagen back with wet-to-dry to fill in the depth of the wound  although apparently [facility] sent silver alginate which they have been using since they were here the last time. Is obviously not what we ordered. 2/25. Not much change in this wound it is over the left ischial tuberosity recurrent wound. We have been using silver collagen with backing wet-to-dry. I think the wound is about the same. There is still some tunneling from about 10-12 o'clock over the ischial tuberosity itself 3/11; pressure ulcer over the left ischial tuberosity. Since she was last here the wound VAC was started and apparently going quite well. We are able to get the home health company that accepts [place] healthcare which is in itself sometimes problematic. There is been improvements in the wound the tunneling seems to be better and is contracted nicely 4/8; 1 month follow-up. Since she was last here we have been using silver collagen under a wound VAC. Some minor contraction I think in wound volume. She is cared for diligently by her husband including pressure relief, incontinence management, nutritional support etc. 6/1; this is almost a 9-monthfollow-up. She is been using silver collagen under wound VAC. Circular area over the left ischial tuberosity. She has been using silver collagen under wound VAC 7/8; 1 month follow-up. Silver collagen under the VAC not really a lot of progress. Tissue at the base of the wound which is right against bone and the tissue next that this does not look completely viable. She is not currently on any antibiotics, she had underlying osteomyelitis I need to look this over 8/16; we are using silver collagen under wound VAC to the left ischial tuberosity wound. Comes in today with absolutely no change in surface area or depth. There is no exposed bone. I did look over her infectious disease notes as I said I would do last time. She last saw Dr. CMegan Salonin December 2020. She completed 6 weeks of Augmentin. This was in response to a bone culture I did showing  methicillin susceptible staph aureus and Enterococcus.  She was supposed to come back to see Dr. Megan Salon at some point although they say that that appointment was canceled unless I chose to recommend return. I think there was supposed to be follow-up with inflammatory markers but I cannot see that that was ever done. She has not been on antibiotics since 9/21; monthly follow-up. We received a call from home health nurse last evening to report green drainage coming out of the wound. Lab work I ordered last time showed a white count of 5.2 a sedimentation rate of 45 and a C-reactive protein of 25 however neither one of the 2 values are substantially different from her previous values in October 2020 or December 2020. Both are slightly higher but only marginally. Otherwise no new complaints from the patient or her husband Electronic Signature(s) Signed: 12/27/2019 4:19:57 PM By: Linton Ham MD Entered By: Linton Ham on 12/27/2019 12:25:38 -------------------------------------------------------------------------------- Physical Exam Details Patient Name: Date of Service: [name], [name] 12/27/2019 10:45 A M Medical Record Number: [id number] Patient Account Number: [id number] Date of Birth/Sex: Treating RN: 03/16/1951 (69 y.o. [name] Primary Care Provider: [name] Other Clinician: Referring Provider: Treating Provider/Extender: Tommy Rainwater in Treatment: 61 Constitutional Patient is hypertensive.. Pulse regular and within target range for patient.Marland Kitchen Respirations regular, non-labored and within target range.. Temperature is normal and within the target range for the patient.Marland Kitchen Appears in no distress. Notes Wound exam left ischial tuberosity. The wound itself is about the same. There is no doubt there is some drainage in the center of the wound which I have done a PCR culture. There is no surrounding erythema or tenderness however. No soft tissue  crepitus Electronic Signature(s) Signed: 12/27/2019 4:19:57 PM By: Linton Ham MD Entered By: Linton Ham on 12/27/2019 12:26:34 -------------------------------------------------------------------------------- Physician Orders Details Patient Name: Date of Service: [name], Ayriana 12/27/2019 10:45 A M Medical Record Number: [id number] Patient Account Number: [id number] Date of Birth/Sex: Treating RN: 1951-02-13 (69 y.o. [name] Primary Care Provider: [name] Other Clinician: Referring Provider: Treating Provider/Extender: Tommy Rainwater in Treatment: 11 Verbal / Phone Orders: No Diagnosis Coding ICD-10 Coding Code Description [phone] Pressure ulcer of left buttock, stage 4 G82.20 Paraplegia, unspecified E11.622 Type 2 diabetes mellitus with other skin ulcer M86.68 Other chronic osteomyelitis, other site Follow-up Appointments Return appointment in 1 month. Dressing Change Frequency Wound #12 Left Ischial Tuberosity Change Dressing every other day. Skin Barriers/Peri-Wound Care Skin Prep - to periwound Wound Cleansing Wound #12 Left Ischial Tuberosity Clean wound with Normal Saline. - or wound cleanser Primary Wound Dressing Wound #12 Left Ischial Tuberosity Silver Collagen - saline moistened gauze over collagen Other: - Discontinue wound vac Secondary Dressing Wound #12 Left Ischial Tuberosity Foam Border - or ABD pad and tape Off-Loading Low air-loss mattress (Group 2) Gel wheelchair cushion Turn and reposition every 2 hours [facility] for wound care. - [facility] naerobe culture (MICRO) - PCR culture sacrum - (ICD10 L89.324 - Pressure ulcer of left Bacteria identified in Unspecified specimen by A buttock, stage 4) LOINC Code: 025-4 Convenience Name: Anerobic culture Electronic Signature(s) Signed: 12/27/2019 4:19:57 PM By: Linton Ham MD Signed: 12/27/2019 4:24:48 PM By: Carlene Coria RN Entered By: Carlene Coria on 12/27/2019 11:11:47 -------------------------------------------------------------------------------- Problem List Details Patient Name: Date of Service: [name], Ziaire 12/27/2019 10:45 A M Medical Record Number: [id number] Patient Account Number: [id number] Date of Birth/Sex: Treating RN: 17-Apr-1950 (69 y.o. 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, stage 4 12/30/2018 No Yes G82.20 Paraplegia, unspecified 12/30/2018 No Yes E11.622 Type 2 diabetes mellitus with other skin ulcer 12/30/2018 No Yes M86.68 Other chronic osteomyelitis, other site 02/17/2019 No Yes Inactive Problems Resolved Problems Electronic Signature(s) Signed: 12/27/2019 4:19:57 PM By: Linton Ham MD Entered By: Linton Ham on 12/27/2019 12:24:00 -------------------------------------------------------------------------------- Progress Note Details Patient Name: Date of Service: [name], Hevin 12/27/2019 10:45 A M Medical Record Number: [id number] Patient Account Number: [id number] Date of Birth/Sex: Treating RN: 1950/08/31 (69 y.o. [name] Primary Care Provider: [name] Other Clinician: Referring Provider: Treating Provider/Extender: Tommy Rainwater in Treatment: 51 Subjective History of Present Illness (HPI) The following HPI elements were documented for the patient's wound: Location: open ulceration of the left gluteal area, left heel and right ankle for about 5 months. Quality: Patient reports No Pain. Severity: Patient states wound(s) are getting worse. Duration: Patient has had the wound for > 5 months prior to seeking treatment at the wound center Context: The wound occurred when the patient has  been paraplegic for about 3 years. Modifying Factors: Wound improving due to current treatment. Associated Signs and Symptoms: Patient reports having foul odor. this 69 year old patient who is known to have hypertension, hypothyroidism, breast cancer, chronic pain syndrome, paraplegia was noted to have a left gluteal decubitus ulcer and was brought into the hospital. During the course of her hospitalization she was debrided in the operating room by Dr. Leland Johns and had all the wounds sharply debrided. The debridement was done for the left ischial wound, the left heel wound and the right ankle wound. Bone cultures were taken at that time but were negative but clinically she was treated for osteomyelitis because of the probing down to bone and open exposed bone. Home health has been giving her antibioticss which include vancomycin and Zosyn. The patient was a smoker until about 3 weeks ago and used to smoke about 10 cigarettes a day for a long while. 12/13/2014 - details of her operative note from 11/03/2014 were reviewed -- PROCEDURE: 1. Excisional debridement skin, subcutaneous, muscle left ischium 35 cm2 2. Excisional debridement skin, subcutaneous tissue left heel 27 cm2 3. Excisional debridement right ankle skin, subcutaneous, bone 30 cm2 01/24/2015 -- she has some issues with her wheelchair cushion but other than that is doing very well and has received Podus boots for her feet. 02/14/2015 -- she was using her old offloading boots and this seemed to have caused her a new pressure ulcer on the left posterior heel near the superior part just below the Achilles tendon. 03/07/2015 -- she has a new ulceration just to the left of the midline on her sacral region more on the left buttock and this has been there for about a week. 08/22/2015 -- was recently admitted to hospital between May 5 and 08/13/2015, with sepsis and leukocytosis due to a UTI. she was treated for a sepsis complicating Escherichia  coli UTI and kidney stones. She also had metabolic and careful up at the secondary to pyelonephritis. He received broad-spectrum antibiotics initially and then received Macrobid as per urology. She was sent home on nitrofurantoin. during her admission she had a CT scan which showed exposed left ischial tuberosity without evidence of osteolysis. 09/12/2015-- the patient is having some issues with her air  mattress and would like to get a opinion from medical modalities. 10/10/2015 -- the issue with her air mattress has not yet been sorted out and the new problem seems to be a lot of odor from the wound VAC. 11/27/2015 -- the patient was admitted to the hospital between July 23 and 10/31/2015. Her problems were sepsis, osteomyelitis of the pelvic bone and acute pyelonephritis. CT of the abdomen and pelvis was consistent with a left-sided pyelonephritis with hydronephrosis and also just showed new sclerosis of the posterior portion of the left anterior pubic ramus suggestive of periosteal reaction consistent with osteomyelitis. She was treated for the osteomyelitis with infectious disease consult recommending 6 weeks of IV antibiotics including vancomycin and Rocephin and the antibiotics were to go on until 12/10/2015. He was seen by Dr. Iran Planas plastic surgery and Dr. Linus Salmons of infectious disease. She had a suprapubic catheter placed during the admission. CT scan done on 10/28/2015 showed specifically -- New sclerosis of the posterior portion of the left inferior pubic ramus with aggressive periosteal reaction, consistent with osteomyelitis, with adjacent soft tissue gas compatible with previously described decubitus ulcer. 12/12/2015 -- she was recently seen by Dr. Linus Salmons, who noted good improvement and CRP and ESR compared to before and he has stopped her antibiotic as per plans to finish on September 4. The patient was encouraged to continue with wound care and consider hyperbaric oxygen therapy. Today  she tells me that she has consented to undergo hyperbaric oxygen therapy and we can start the paperwork. 01/02/2016 -- her PCP had gained about 3 years but she still persists in having problems during hyperbaric oxygen therapy with some discomfort in the ears. 01/09/16; pressure area with underlying osteomyelitis in the left buttock. Wound bed itself has some slight amount of grayish surface slough however I do not think any debridement was necessary. There is no exposed bone soft tissue appears stable. She is using a wound VAC 01/16/16; back for weekly wound review in conjunction with HBO. She has a deep wound over the left initial tuberosity previously treated with 6 weeks of IV antibiotics for osteomyelitis. Wound bed looks reasonably healthy although the base of this is still precariously close to bone. She has been using a wound VAC. 01/23/2016 -- she has completed her course of antibiotics and this week the only new thing is her right great toe nail was avulsed and she has got an open wound over the nailbed. 01/31/16 she has completed her course of antibiotics. Her right great toenail avulsed last week and she's been using silver alginate for this as well. Still using a wound VAC to the substantial stage IV wound over the left ischial tuberosity 03/05/2016 -- the patient has had a opinion from the plastic surgery group at [facility] and details of this are not available yet but the patient's verbal report has been heard by me. Did not sound like there was any optimistic discussion regarding reconstruction and the net result would be to continue with the wound VAC application. I will await the official reports. Addendum: -- she was seen at [facility] by Dr. Tressa Busman. After a thorough review and from what I understand spending 45 minutes with the patient his assessment has been noted by me in detail and the management options were: 1. Continued pressure  offloading and wound care versus operative procedures including wound excision 2. Soft tissue and bone sampling 3. If the wound gets larger wound closure would be  done using a variety of plastic surgical techniques including but not limited to skin substitute, possible skin graft, local versus regional flaps, negative pressure dressing application. 4. He discussed with her details of flap surgery and the risks associated 5. He made a comment that since the patient was operated on by Dr. Leland Johns of [facility] plastic surgery unit in [place] the patient may continue to follow-up there for further evaluation for surgical flap closure in the future. 03/19/2016 -- the patient continues to be rather depressed and frustrated with her lack of rapid progress in healing this wound especially because she thought after hyperbaric oxygen therapy the wound would heal extremely fast. She now understands that was not the implied benefit on wound care which was the recommendation for hyperbaric oxygen therapy. I have had a lengthy discussion with the patient and her husband regarding her options: 1. Continue with collagen and wound VAC for the primary dressing and offloading and all supportive care. 2. See Dr. Iran Planas for possible placement of Acell or Integra in the OR. 3. get a second opinion from a wound care center and surrounding regions/counties 05/07/2016 -- Note from Dr. Celedonio Miyamoto, who noted that the patient has declined flap surgery. She has discussed application of A cell, and try a few applications to see how the wound progresses. She is also recommended that we could apply products here in the wound center, like Oasis. during her preop workup it was found that her hemoglobin A1c was 11% and she has now been diagnosed as having diabetes mellitus and has been put on appropriate treatment by her PCP 05/28/2016 -- tells me her blood sugars have been doing well and she has an  appointment to see her PCP in the next couple of weeks to check her hemoglobin A1c. Other than that she continues to do well. 06/25/2016 -- have not seen her back for the last month but she says her health has been about the same and she has an appointment to check the A1c next week 09/10/16 ---- was seen by Dr. Celedonio Miyamoto -- who applied Acell and saw her back in follow-up. She has recommended silver alginate to the wound every other day and cover with foam. If no significant drainage could transition to collagen every other day. She recommended discontinuing wound VAC. There were no plans to repeat application of Acell. The patient expressed that her husband could do the wound care as going to the Wound Ctr., would cost several $100 for each visit. 10/21/2016 -- her insurance company is getting her new mattress and she is pleased about that. Other than that she has been doing dressings with PolyMem Silver and has been doing very well 02/18/2017 -- she has gone through several changes of her mattress and has not been pleased with any of them. The ventricles are still working on trying to fit her with the appropriate low air-loss mattress. She has a new wound on the gluteal area which is clearly separated from the original wound. 03/25/17-she is here in follow-up evaluation for her left ischialpressure ulcer. She remains unsatisfied with her pressure mattress. She admits to sitting multiple hours a day, in the bed. We have discussed offloading options. The wound does not appear infected. Nutrition does not appear to be a concern. Will follow-up in 4 weeks, if wound continues to be stalled may consider x-ray to evaluate for refractory osteomyelitis. 04/21/17; this is a patient that I don't know all that well. She has a chronic  wound which at one point had underlying osteomyelitis in the left ischial tuberosity. This is a stage IV pressure ulcer. Over the last 3 months she has a stage II wound  inferiorly to the original wound. The last time she was here her dressing was changed to silver collagen although the patient's husband who changes the dressing said that the collagen stuck to the wound and remove skin from the superficial area therefore he switched back to [place] 05/13/17; this is a patient we've been following for a left ischial tuberosity wound which was stage IV at one point had underlying osteomyelitis. Over the last several months she's had a stage II wound just inferior and medial to the related to the wound. According to her husband he is using Endoform layer with collagen although this is not what I had last time. According to her husband they are using Elgie Congo with collagen although I don't quite know how that started. She was hospitalized from 1/20 through 04/30/16. This was related to a UTI. Her blood cultures were negative, urine culture showed multiple species. She did have a CT scan of the abdomen and pelvis which documented chronic osteomyelitis in the area of the wound inflammatory markers were unremarkable. She has had prior knowledge of osteomyelitis. It looks as though she received IV antibiotics in 2017 and was treated with a course of hyperbaric oxygen. 05/28/17; the wound over the left ischial tuberosity is deeper today and abuts clearly on bone. Nursing intake reported drainage. I therefore culture of the wound. The more superficial area just below this looks about the same. They once again complained that there are mattress cover is not working although apparently [facility] is been noted to see this many times in the report is that the device is functional 06/18/17; the patient had a probing area on the left ischial tuberosity that was draining purulent fluid last time. This also clearly seemed to have open bone. Culture I did showed pansensitive pseudomonas including third generation cephalosporins. I treated this with cefdinir 300 twice a day  for 10 days and things seem to have improved. She has a more superficial wound just underneath this area. Amazingly she has a new air mattress through [facility]. I think they gave this to her as a parking give. In any case this now works according to the patient may have something to do with why the areas are looking better. 07/09/17; the patient has a probing area in the left ischial tuberosity that still has some depth. However this is contracted in terms of the wound orifice although the depth is still roughly the same. There is no undermining. ooShe also has the satellite wound which is more superficial. This appears to have a healthy surface we've been using silver collagen 08/06/17; the patient's wound is over the left ischial tuberosity and a satellite lesion just underneath this. The original wound was actually a deep stage 4 wound. We have made good progress in 2 months and there is no longer exposed bone here. 09/03/17; left ischial tuberosity actually appears to be quite healthy. I think we are making progress. No debridement is required. There is no surrounding erythema 10/01/17 I follow this patient monthly for her left ischial tuberosity wound. There is 2 areas the original area and a satellite area. The satellite area looks a lot better there is no surrounding erythema. Her husband relates that he is having trouble maintaining the dressing. This has to do with the soft  tissue around it. He states he puts the collagen in but he cannot make sure that it stays in even with the ABD pads and tape that he is been using 10/29/17; patient arrives with a better looking noon today. Some of the satellite lesions have closed. using Prisma 11/26/17; the patient has a large cone-shaped area with the tip of the Cone deep within her buttock soft tissue. The walls of the Cone are epithelialized however the base is still open. The area at the base of this looks moist we've been using silver collagen.  Will change to silver alginate 12/31/2017; the wound appears to have come in fairly nicely. Using silver alginate. There is no surrounding maceration or infection 01/28/18; there is still an open area here over the left initial tuberosity. Base of this however looks healthy. There is no surrounding infection 02/25/18; the area of its open is over the left ischial tuberosity. The base of this is where the wound is. This is a large inverted cone-shaped area with the wound at the tip. Dimensions of the wound at the tip are improved. There is a area of denuded skin about halfway towards the tip which her husband thinks may have happened today when he was bathing her. 04/20/17; the area is still open over the left initial tuberosity. 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 This is now completely closed over although it is closed over with a divot and skin over bone at the base of this. Continued aggressive offloading will be necessary. 12/30/2018 READMISSION This is a 69 year old woman with chronic paraplegia. I picked her up for her care from Dr. Con Memos in this clinic after he departed. She had a stage IV pressure wound over the left ischial tuberosity. She was treated twice for her underlying osteomyelitis and this I believe firstly in 2016 and again in 2017. There were some plans at some point for flap closure of this however she was discovered to have uncontrolled diabetes and I do not think this was ever accomplished. She ultimately healed over in this clinic and was discharged in May. She has a large cone-shaped indentation with the tip of this going towards the left ischial tuberosity. It is not an easy area to examine but at that time I thought all of this was epithelialized. Apparently there was a  reopening here shortly after she left the clinic last time. She was admitted to hospital at the end of June for Klebsiella bacteremia felt to be secondary to UTI. A CT scan of the pelvis is listed below and there was initially some concern that she had underlying osteomyelitis although I believe she was seen by infectious disease and that was felt to be not the case: I do not see any new cultures or inflammatory markers IMPRESSION: 1. No CT evidence for acute intra-abdominal or pelvic abnormality. Large volume of stool throughout the colon. 2. Enlarged fatty liver with fat sparing near the gallbladder fossa 3. Cortical scarring right kidney. Bilateral intrarenal stones without hydronephrosis. Thick-walled urinary bladder decompressed by suprapubic catheter 4. Deep left decubitus ulcer with underlying left ischial changes suggesting osteomyelitis. Her husband has been using silver collagen in the wound. She has not been systemically unwell no fever chills eating and drinking well. They rigorously offload this wound only getting  up in the wheelchair when she is going to appointments the rest of the time she is in bed. 10/8; wound measures larger and she now has exposed bone. We have been using silver alginate 11/12 still using silver alginate. The patient saw Dr. Megan Salon of infectious disease. She was started on Augmentin 500 mg twice daily. She is due to follow-up with Dr. Megan Salon I believe next week. Lab work Dr. Megan Salon requested showed a sedimentation rate of 28 and CRP of 20 although her CRP 1 year ago was 18.8. Sedimentation rate 1 year ago was 11 basic metabolic panel showed a creatinine of 1.12 12/3; the patient followed up with Dr. Megan Salon yesterday. She is still on Augmentin twice daily. This was directed by Dr. Megan Salon. The patient's inflammatory markers have improved which is gratifying. Her C-reactive protein was repeated yesterday and follow-up booked with infectious disease in  January. In addition I have been getting secure text messages I think from palliative care through [facility] [employer]. I think they were hoping to provide services to the patient in her home. They could not get a hold of the primary physician and so they reached out to me on 2 separate occasions. 12/17; patient last saw Dr. Megan Salon on 12/2. She is finishing up with Augmentin. Her C-reactive protein was 20 on 10/21, 10.1 on 11/19 and 17 on 12/2. The wound itself still has depth and undermining. We are using Santyl with the backing wet-to-dry 04/27/2019. The wound is gradually clearing up in terms of the surface although it is not filled in that much. Still abuts right against bone 2/4; patient with a deep pressure ulcer over the left ischial tuberosity. I thought she was going to follow-up with infectious disease to follow her inflammatory markers although the patient states that they stated that they did not need to see her unless we felt it was necessary. I will need to check their notes. In any case we ordered moistened silver collagen back with wet-to-dry to fill in the depth of the wound although apparently [facility] sent silver alginate which they have been using since they were here the last time. Is obviously not what we ordered. 2/25. Not much change in this wound it is over the left ischial tuberosity recurrent wound. We have been using silver collagen with backing wet-to-dry. I think the wound is about the same. There is still some tunneling from about 10-12 o'clock over the ischial tuberosity itself 3/11; pressure ulcer over the left ischial tuberosity. Since she was last here the wound VAC was started and apparently going quite well. We are able to get the home health company that accepts [place] healthcare which is in itself sometimes problematic. There is been improvements in the wound the tunneling seems to be better and is contracted nicely 4/8; 1 month follow-up. Since  she was last here we have been using silver collagen under a wound VAC. Some minor contraction I think in wound volume. She is cared for diligently by her husband including pressure relief, incontinence management, nutritional support etc. 6/1; this is almost a 44-monthfollow-up. She is been using silver collagen under wound VAC. Circular area over the left ischial tuberosity. She has been using silver collagen under wound VAC 7/8; 1 month follow-up. Silver collagen under the VAC not really a lot of progress. Tissue at the base of the wound which is right against bone and the tissue next that this does not look completely viable. She is not currently  on any antibiotics, she had underlying osteomyelitis I need to look this over 8/16; we are using silver collagen under wound VAC to the left ischial tuberosity wound. Comes in today with absolutely no change in surface area or depth. There is no exposed bone. I did look over her infectious disease notes as I said I would do last time. She last saw Dr. Megan Salon in December 2020. She completed 6 weeks of Augmentin. This was in response to a bone culture I did showing methicillin susceptible staph aureus and Enterococcus. She was supposed to come back to see Dr. Megan Salon at some point although they say that that appointment was canceled unless I chose to recommend return. I think there was supposed to be follow-up with inflammatory markers but I cannot see that that was ever done. She has not been on antibiotics since 9/21; monthly follow-up. We received a call from home health nurse last evening to report green drainage coming out of the wound. Lab work I ordered last time showed a white count of 5.2 a sedimentation rate of 45 and a C-reactive protein of 25 however neither one of the 2 values are substantially different from her previous values in October 2020 or December 2020. Both are slightly higher but only marginally. Otherwise no new complaints from the  patient or her husband Objective Constitutional Patient is hypertensive.. Pulse regular and within target range for patient.Marland Kitchen Respirations regular, non-labored and within target range.. Temperature is normal and within the target range for the patient.Marland Kitchen Appears in no distress. Vitals Time Taken: 10:40 AM, Height: 63 in, Weight: 185 lbs, BMI: 32.8, Temperature: 98.4 F, Pulse: 98 bpm, Respiratory Rate: 18 breaths/min, Blood Pressure: 186/77 mmHg. General Notes: Wound exam left ischial tuberosity. The wound itself is about the same. There is no doubt there is some drainage in the center of the wound which I have done a PCR culture. There is no surrounding erythema or tenderness however. No soft tissue crepitus Integumentary (Hair, Skin) Wound #12 status is Open. Original cause of wound was Pressure Injury. The wound is located on the Left Ischial Tuberosity. The wound measures 3cm length x 1.5cm width x 1.9cm depth; 3.534cm^2 area and 6.715cm^3 volume. There is Fat Layer (Subcutaneous Tissue) exposed. There is no tunneling or undermining noted. There is a medium amount of serosanguineous drainage noted. The wound margin is well defined and not attached to the wound base. There is large (67- 100%) red, pink granulation within the wound bed. There is no necrotic tissue within the wound bed. Assessment Active Problems ICD-10 Pressure ulcer of left buttock, stage 4 Paraplegia, unspecified Type 2 diabetes mellitus with other skin ulcer Other chronic osteomyelitis, other site Plan Follow-up Appointments: Return appointment in 1 month. Dressing Change Frequency: Wound #12 Left Ischial Tuberosity: Change Dressing every other day. Skin Barriers/Peri-Wound Care: Skin Prep - to periwound Wound Cleansing: Wound #12 Left Ischial Tuberosity: Clean wound with Normal Saline. - or wound cleanser Primary Wound Dressing: Wound #12 Left Ischial Tuberosity: Silver Collagen - saline moistened gauze over  collagen Other: - Discontinue wound vac Secondary Dressing: Wound #12 Left Ischial Tuberosity: Foam Border - or ABD pad and tape Off-Loading: Low air-loss mattress (Group 2) Gel wheelchair cushion Turn and reposition every 2 hours Home Health: [facility] for [facility]. - [facility] Laboratory ordered were: Anerobic culture PCR - PCR culture sacrum #1 I did a PCR culture but did not give her empiric antibiotics 2. I had some thoughts about changing  the silver alginate but for now continued with the collagen 3. I do not think the inflammatory markers support tremendous change from previous markers late last year Electronic Signature(s) Signed: 12/27/2019 4:19:57 PM By: Linton Ham MD Entered By: Linton Ham on 12/27/2019 12:28:42 -------------------------------------------------------------------------------- SuperBill Details Patient Name: Date of Service: ANAYAH, ARVANITIS 12/27/2019 Medical Record Number: [id number] Patient Account Number: [id number] Date of Birth/Sex: Treating RN: [birth year] (69 y.o. [name] Primary Care Provider: [name] Other Clinician: Referring Provider: Treating Provider/Extender: Tommy Rainwater in Treatment: 51 Diagnosis Coding ICD-10 Codes Code Description [phone] Pressure ulcer of left buttock, stage 4 G82.20 Paraplegia, unspecified E11.622 Type 2 diabetes mellitus with other skin ulcer M86.68 Other chronic osteomyelitis, other site Facility Procedures The patient participates with Medicare or their insurance follows the Medicare Facility Guidelines: CPT4 Code Description Modifier Quantity [id number] [place] VISIT-LEV 3 EST PT 1 Physician Procedures : CPT4 Code Description Modifier [id number] - WC PHYS LEVEL 3 - EST PT ICD-10 Diagnosis Description 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, stage 4 G82.20 Paraplegia, unspecified M86.68 Other chronic osteomyelitis, other  site Quantity: 1 Electronic Signature(s) Signed: 12/27/2019 4:19:57 PM By: Linton Ham MD Entered By: Linton Ham on 12/27/2019 12:29:09

## 2019-12-27 NOTE — Progress Notes (Signed)
CALAYA, GILDNER ([id number]) Visit Report for 12/27/2019 Arrival Information Details Patient Name: Date of Service: [name], [name] 12/27/2019 10:45 A M Medical Record Number: [id number] Patient Account Number: [id number] Date of Birth/Sex: Treating RN: 05-11-1950 (69 y.o. [name] Primary Care [name]: [name] Other Clinician: Referring [name]: Treating [name]/Extender: [name] in Treatment: 60 Visit Information History Since Last Visit Added or deleted any medications: No Patient Arrived: Wheel Chair Any new allergies or adverse reactions: No Arrival Time: 10:42 Had a fall or experienced change in No Accompanied By: husband activities of daily living that may affect Transfer Assistance: Manual risk of falls: Patient Identification Verified: Yes Signs or symptoms of abuse/neglect since last visito No Secondary Verification Process Completed: Yes Hospitalized since last visit: No Patient Requires Transmission-Based Precautions: No Implantable device outside of the clinic excluding No Patient Has Alerts: No cellular tissue based products placed in the center since last visit: Has Dressing in Place as Prescribed: Yes Pain Present Now: No Electronic Signature(s) Signed: 12/27/2019 4:07:33 PM By: [name] Entered By: [name] on 12/27/2019 10:43:08 -------------------------------------------------------------------------------- Clinic Level of Care Assessment Details Patient Name: Date of Service: [name], [name] 12/27/2019 10:45 A M Medical Record Number: [id number] Patient Account Number: [id number] Date of Birth/Sex: Treating RN: Jan 29, 1951 (69 y.o. [name] Primary Care [name]: [name] Other Clinician: Referring [name]: Treating [name]/Extender: [name] in Treatment: 51 Clinic Level of Care Assessment Items TOOL 4 Quantity Score X- 1 0 Use when only an EandM [name] performed  on FOLLOW-UP visit ASSESSMENTS - Nursing Assessment / Reassessment X- 1 10 Reassessment of Co-morbidities (includes updates in patient status) X- 1 5 Reassessment of Adherence to Treatment Plan ASSESSMENTS - Wound and Skin A ssessment / Reassessment X - Simple Wound Assessment / Reassessment - one wound 1 5 []  - 0 Complex Wound Assessment / Reassessment - multiple wounds []  - 0 Dermatologic / Skin Assessment (not related to wound area) ASSESSMENTS - Focused Assessment []  - 0 Circumferential Edema Measurements - multi extremities []  - 0 Nutritional Assessment / Counseling / Intervention []  - 0 Lower Extremity Assessment (monofilament, tuning fork, pulses) []  - 0 Peripheral Arterial Disease Assessment (using hand held doppler) ASSESSMENTS - Ostomy and/or Continence Assessment and Care []  - 0 Incontinence Assessment and Management []  - 0 Ostomy Care Assessment and Management (repouching, etc.) PROCESS - Coordination of Care X - Simple Patient / Family Education for ongoing care 1 15 []  - 0 Complex (extensive) Patient / Family Education for ongoing care X- 1 10 Staff obtains Programmer, systems, Records, T Results / Process Orders est []  - 0 Staff telephones HHA, Nursing Homes / Clarify orders / etc []  - 0 Routine Transfer to another Facility (non-emergent condition) []  - 0 Routine [name] Admission (non-emergent condition) []  - 0 New Admissions / Biomedical engineer / Ordering NPWT Apligraf, etc. , []  - 0 Emergency [name] Admission (emergent condition) X- 1 10 Simple Discharge Coordination []  - 0 Complex (extensive) Discharge Coordination PROCESS - Special Needs []  - 0 Pediatric / Minor Patient Management []  - 0 Isolation Patient Management []  - 0 Hearing / Language / Visual special needs []  - 0 Assessment of Community assistance (transportation, D/C planning, etc.) []  - 0 Additional assistance / Altered mentation []  - 0 Support Surface(s) Assessment (bed,  cushion, seat, etc.) INTERVENTIONS - Wound Cleansing / Measurement X - Simple Wound Cleansing - one wound 1 5 []  - 0 Complex Wound Cleansing - multiple wounds  X- 1 5 Wound Imaging (photographs - any number of wounds) []  - 0 Wound Tracing (instead of photographs) X- 1 5 Simple Wound Measurement - one wound []  - 0 Complex Wound Measurement - multiple wounds INTERVENTIONS - Wound Dressings []  - 0 Small Wound Dressing one or multiple wounds X- 1 15 Medium Wound Dressing one or multiple wounds []  - 0 Large Wound Dressing one or multiple wounds X- 1 5 Application of Medications - topical []  - 0 Application of Medications - injection INTERVENTIONS - Miscellaneous []  - 0 External ear exam X- 1 5 Specimen Collection (cultures, biopsies, blood, body fluids, etc.) []  - 0 Specimen(s) / Culture(s) sent or taken to Lab for analysis []  - 0 Patient Transfer (multiple staff / Civil Service fast streamer / Similar devices) []  - 0 Simple Staple / Suture removal (25 or less) []  - 0 Complex Staple / Suture removal (26 or more) []  - 0 Hypo / Hyperglycemic Management (close monitor of Blood Glucose) []  - 0 Ankle / Brachial Index (ABI) - do not check if billed separately X- 1 5 Vital Signs Has the patient been seen at the [name] within the last three years: Yes Total Score: 100 Level Of Care: New/Established - Level 3 Electronic Signature(s) Signed: 12/27/2019 4:24:48 PM By: [name] Coria RN Entered By: [name] on 12/27/2019 11:12:58 -------------------------------------------------------------------------------- Encounter Discharge Information Details Patient Name: Date of Service: [name], [name] 12/27/2019 10:45 A M Medical Record Number: [id number] Patient Account Number: [id number] Date of Birth/Sex: Treating RN: [birth year] (69 y.o. [name] Primary Care Amylah Will: [name] Other Clinician: Referring Regina Ganci: Treating Paco Cislo/Extender: [name] in  Treatment: 18 Encounter Discharge Information Items Discharge Condition: Stable Ambulatory Status: Wheelchair Discharge Destination: Home Transportation: Private Auto Accompanied By: husband Schedule Follow-up Appointment: Yes Clinical Summary of Care: Patient Declined Electronic Signature(s) Signed: 12/27/2019 4:07:33 PM By: [name] Entered By: [name] on 12/27/2019 11:28:29 -------------------------------------------------------------------------------- Lower Extremity Assessment Details Patient Name: Date of Service: [name], [name] 12/27/2019 10:45 A M Medical Record Number: [id number] Patient Account Number: [id number] Date of Birth/Sex: Treating RN: 12/17/1950 (69 y.o. [name] Primary Care Court Gracia: [name] Other Clinician: Referring Janeka Libman: Treating Jalyssa Fleisher/Extender: [name] in Treatment: 51 Electronic Signature(s) Signed: 12/27/2019 4:07:33 PM By: [name] Entered By: [name] on 12/27/2019 10:43:47 -------------------------------------------------------------------------------- Multi Wound Chart Details Patient Name: Date of Service: [name], [name] 12/27/2019 10:45 A M Medical Record Number: [id number] Patient Account Number: [id number] Date of Birth/Sex: Treating RN: 1950/12/28 (68 y.o. [name] Primary Care Suleyman Ehrman: [name] Other Clinician: Referring Jana Swartzlander: Treating Shavonta Gossen/Extender: [name] in Treatment: 23 Vital Signs Height(in): 63 Pulse(bpm): 98 Weight(lbs): 185 Blood Pressure(mmHg): 186/77 Body Mass Index(BMI): 33 Temperature(F): 98.4 Respiratory Rate(breaths/min): 18 Photos: [12:No Photos Left Ischial Tuberosity] [N/A:N/A N/A] Wound Location: [12:Pressure Injury] [N/A:N/A] Wounding Event: [12:Pressure Ulcer] [N/A:N/A] Primary Etiology: [12:Anemia, Hypertension, Type II] [N/A:N/A] Comorbid History: [12:Diabetes, Osteoarthritis,  Dementia, Paraplegia, Received Radiation 09/06/2018] [N/A:N/A] Date Acquired: [12:51] [N/A:N/A] Weeks of Treatment: [12:Open] [N/A:N/A] Wound Status: [12:3x1.5x1.9] [N/A:N/A] Measurements L x W x D (cm) [12:3.534] [N/A:N/A] A (cm) : rea [12:6.715] [N/A:N/A] Volume (cm) : [12:-1397.50%] [N/A:N/A] % Reduction in A rea: [12:-3067.50%] [N/A:N/A] % Reduction in Volume: [12:Category/Stage IV] [N/A:N/A] Classification: [12:Medium] [N/A:N/A] Exudate A mount: [12:Serosanguineous] [N/A:N/A] Exudate Type: [12:red, brown] [N/A:N/A] Exudate Color: [12:Well defined, not attached] [N/A:N/A] Wound Margin: [12:Large (67-100%)] [N/A:N/A] Granulation A mount: [12:Red, Pink] [N/A:N/A] Granulation Quality: [12:None Present (0%)] [N/A:N/A] Necrotic A  mount: [12:Fat Layer (Subcutaneous Tissue): Yes N/A] Exposed Structures: [12:Fascia: No Tendon: No Muscle: No Joint: No Bone: No Small (1-33%)] [N/A:N/A] Treatment Notes Wound #12 (Left Ischial Tuberosity) 1. Cleanse With Wound Cleanser 2. Periwound Care Skin Prep 3. Primary Dressing Applied [employer] Other primary dressing (specifiy in notes) 4. Secondary Dressing Dry Gauze Foam Border Dressing Notes secondary saline moist gauze to dry Electronic Signature(s) Signed: 12/27/2019 4:19:57 PM By: Linton Ham MD Signed: 12/27/2019 4:24:48 PM By: [name] Coria RN Entered By: Linton Ham on 12/27/2019 12:24:07 -------------------------------------------------------------------------------- Multi-Disciplinary Care Plan Details Patient Name: Date of Service: [name], [name] 12/27/2019 10:45 A M Medical Record Number: [id number] Patient Account Number: [id number] Date of Birth/Sex: Treating RN: 1950/12/30 (69 y.o. [name] Primary Care Betsie Peckman: [name] Other Clinician: Referring Versie Fleener: Treating Ohana Birdwell/Extender: [name] in Treatment: 10 Active Inactive Wound/Skin Impairment Nursing Diagnoses: Knowledge  deficit related to ulceration/compromised skin integrity Goals: Patient/caregiver will verbalize understanding of skin care regimen Date Initiated: 12/30/2018 Target Resolution Date: 01/23/2020 Goal Status: Active Interventions: Assess patient/caregiver ability to obtain necessary supplies Assess patient/caregiver ability to perform ulcer/skin care regimen upon admission and as needed Assess ulceration(s) every visit Provide education on ulcer and skin care Treatment Activities: Skin care regimen initiated : 12/30/2018 Topical wound management initiated : 12/30/2018 Notes: Electronic Signature(s) Signed: 12/27/2019 4:24:48 PM By: [name] Coria RN Entered By: [name] on 12/27/2019 10:37:11 -------------------------------------------------------------------------------- Pain Assessment Details Patient Name: Date of Service: [name], [name] 12/27/2019 10:45 A M Medical Record Number: [id number] Patient Account Number: [id number] Date of Birth/Sex: Treating RN: 1951-03-18 (69 y.o. [name] Primary Care Atreus Hasz: [name] Other Clinician: Referring Foch Rosenwald: Treating Piedad Standiford/Extender: [name] in Treatment: 81 Active Problems Location of Pain Severity and Description of Pain Patient Has Paino No Site Locations Pain Management and Medication Current Pain Management: Electronic Signature(s) Signed: 12/27/2019 4:07:33 PM By: [name] Entered By: [name] on 12/27/2019 10:43:40 -------------------------------------------------------------------------------- Patient/Caregiver Education Details Patient Name: Date of Service: [name], [name] 9/21/2021andnbsp10:45 [place] Record Number: [id number] Patient Account Number: [id number] Date of Birth/Gender: Treating RN: [birth year] (69 y.o. [name] Primary Care Physician: [name] Other Clinician: Referring Physician: Treating Physician/Extender: [name] in Treatment: 49 Education Assessment Education Provided To: Patient Education Topics Provided Wound/Skin Impairment: Methods: Explain/Verbal Responses: State content correctly [employer]) Signed: 12/27/2019 4:24:48 PM By: [name] Coria RN Entered By: [name] on 12/27/2019 10:37:28 -------------------------------------------------------------------------------- Wound Assessment Details Patient Name: Date of Service: [name], [name] 12/27/2019 10:45 A M Medical Record Number: [id number] Patient Account Number: [id number] Date of Birth/Sex: Treating RN: [birth year] (69 y.o. [name] Primary Care Stevie Charter: [name] Other Clinician: Referring Dempsey Knotek: Treating Tawan Degroote/Extender: [name] in Treatment: 49 Wound Status Wound Number: 12 Primary Pressure Ulcer Etiology: Wound Location: Left Ischial Tuberosity Wound Open Wounding Event: Pressure Injury Status: Date Acquired: 09/06/2018 Comorbid Anemia, Hypertension, Type II Diabetes, Osteoarthritis, Weeks Of Treatment: 51 History: Dementia, Paraplegia, Received Radiation Clustered Wound: No Wound Measurements Length: (cm) 3 Width: (cm) 1.5 Depth: (cm) 1.9 Area: (cm) 3.534 Volume: (cm) 6.715 % Reduction in Area: -1397.5% % Reduction in Volume: -3067.5% Epithelialization: Small (1-33%) Tunneling: No Undermining: No Wound Description Classification: Category/Stage IV Wound Margin: Well defined, not attached Exudate Amount: Medium Exudate Type: Serosanguineous Exudate Color: red, brown Foul Odor After Cleansing: No Slough/Fibrino No Wound Bed Granulation Amount: Large (67-100%) Exposed Structure Granulation Quality: Red, Pink Fascia  Exposed: No Necrotic Amount: None Present (0%) Fat Layer (Subcutaneous Tissue) Exposed: Yes Tendon Exposed: No Muscle Exposed: No Joint Exposed: No Bone Exposed: No Treatment Notes Wound #12 (Left Ischial  Tuberosity) 1. Cleanse With Wound Cleanser 2. Periwound Care Skin Prep 3. Primary Dressing Applied [employer] Other primary dressing (specifiy in notes) 4. Secondary Dressing Dry Gauze Foam Border Dressing Notes secondary saline moist gauze to dry Electronic Signature(s) Signed: 12/27/2019 4:07:33 PM By: [name] Entered By: [name] on 12/27/2019 10:46:50 -------------------------------------------------------------------------------- Vitals Details Patient Name: Date of Service: [name], [name] 12/27/2019 10:45 A M Medical Record Number: [id number] Patient Account Number: [id number] Date of Birth/Sex: Treating RN: 15-Nov-1950 (69 y.o. [name] Primary Care Slayton Lubitz: [name] Other Clinician: Referring Bora Bost: Treating Teodor Prater/Extender: [name] in Treatment: 51 Vital Signs Time Taken: 10:40 Temperature (F): 98.4 Height (in): 63 Pulse (bpm): 98 Weight (lbs): 185 Respiratory Rate (breaths/min): 18 Body Mass Index (BMI): 32.8 Blood Pressure (mmHg): 186/77 Reference Range: 80 - 120 mg / dl Electronic Signature(s) Signed: 12/27/2019 4:07:33 PM By: [name] Entered By: [name] on 12/27/2019 10:43:34

## 2020-01-24 ENCOUNTER — Other Ambulatory Visit: Payer: Self-pay

## 2020-01-24 ENCOUNTER — Encounter (HOSPITAL_BASED_OUTPATIENT_CLINIC_OR_DEPARTMENT_OTHER): Payer: Medicare Other | Attending: Internal Medicine | Admitting: Internal Medicine

## 2020-01-24 DIAGNOSIS — L89329 Pressure ulcer of left buttock, unspecified stage: Secondary | ICD-10-CM | POA: Diagnosis present

## 2020-01-24 DIAGNOSIS — M8668 Other chronic osteomyelitis, other site: Secondary | ICD-10-CM | POA: Insufficient documentation

## 2020-01-24 DIAGNOSIS — E11622 Type 2 diabetes mellitus with other skin ulcer: Secondary | ICD-10-CM | POA: Insufficient documentation

## 2020-01-24 DIAGNOSIS — L89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, stage 4: Secondary | ICD-10-CM | POA: Insufficient documentation

## 2020-01-24 DIAGNOSIS — G8220 Paraplegia, unspecified: Secondary | ICD-10-CM | POA: Diagnosis not present

## 2020-01-24 DIAGNOSIS — I10 Essential (primary) hypertension: Secondary | ICD-10-CM | POA: Diagnosis not present

## 2020-01-24 NOTE — Progress Notes (Signed)
[name] ([id number]) Visit Report for 01/24/2020 HPI Details Patient Name: Date of Service: [name], [name] 01/24/2020 10:45 A M Medical Record Number: [id number] Patient Account Number: [id number] Date of Birth/Sex: Treating RN: 1950/05/16 (69 y.o. [name] Primary Care Provider: [name] Other Clinician: Referring Provider: Treating Provider/Extender: Tommy Rainwater in Treatment: 52 History of Present Illness Location: open ulceration of the left gluteal area, left heel and right ankle for about 5 months. Quality: Patient reports No Pain. Severity: Patient states wound(s) are getting worse. Duration: Patient has had the wound for > 5 months prior to seeking treatment at the wound center Context: The wound occurred when the patient has been paraplegic for about 3 years. Modifying Factors: Wound improving due to current treatment. ssociated Signs and Symptoms: Patient reports having foul odor. A HPI Description: this 69 year old patient who is known to have hypertension, hypothyroidism, breast cancer, chronic pain syndrome, paraplegia was noted to have a left gluteal decubitus ulcer and was brought into the hospital. During the course of her hospitalization she was debrided in the operating room by Dr. Leland Johns and had all the wounds sharply debrided. The debridement was done for the left ischial wound, the left heel wound and the right ankle wound. Bone cultures were taken at that time but were negative but clinically she was treated for osteomyelitis because of the probing down to bone and open exposed bone. Home health has been giving her antibioticss which include vancomycin and Zosyn. The patient was a smoker until about 3 weeks ago and used to smoke about 10 cigarettes a day for a long while. 12/13/2014 - details of her operative note from 11/03/2014 were reviewed -- PROCEDURE: 1. Excisional debridement skin, subcutaneous, muscle left ischium  35 cm2 2. Excisional debridement skin, subcutaneous tissue left heel 27 cm2 3. Excisional debridement right ankle skin, subcutaneous, bone 30 cm2 01/24/2015 -- she has some issues with her wheelchair cushion but other than that is doing very well and has received Podus boots for her feet. 02/14/2015 -- she was using her old offloading boots and this seemed to have caused her a new pressure ulcer on the left posterior heel near the superior part just below the Achilles tendon. 03/07/2015 -- she has a new ulceration just to the left of the midline on her sacral region more on the left buttock and this has been there for about a week. 08/22/2015 -- was recently admitted to hospital between May 5 and 08/13/2015, with sepsis and leukocytosis due to a UTI. she was treated for a sepsis complicating Escherichia coli UTI and kidney stones. She also had metabolic and careful up at the secondary to pyelonephritis. He received broad-spectrum antibiotics initially and then received Macrobid as per urology. She was sent home on nitrofurantoin. during her admission she had a CT scan which showed exposed left ischial tuberosity without evidence of osteolysis. 09/12/2015-- the patient is having some issues with her air mattress and would like to get a opinion from medical modalities. 10/10/2015 -- the issue with her air mattress has not yet been sorted out and the new problem seems to be a lot of odor from the wound VAC. 11/27/2015 -- the patient was admitted to the hospital between July 23 and 10/31/2015. Her problems were sepsis, osteomyelitis of the pelvic bone and acute pyelonephritis. CT of the abdomen and pelvis was consistent with a left-sided pyelonephritis with hydronephrosis and also just showed new sclerosis of the posterior portion of  the left anterior pubic ramus suggestive of periosteal reaction consistent with osteomyelitis. She was treated for the osteomyelitis with infectious disease consult  recommending 6 weeks of IV antibiotics including vancomycin and Rocephin and the antibiotics were to go on until 12/10/2015. He was seen by Dr. Iran Planas plastic surgery and Dr. Linus Salmons of infectious disease. She had a suprapubic catheter placed during the admission. CT scan done on 10/28/2015 showed specifically -- New sclerosis of the posterior portion of the left inferior pubic ramus with aggressive periosteal reaction, consistent with osteomyelitis, with adjacent soft tissue gas compatible with previously described decubitus ulcer. 12/12/2015 -- she was recently seen by Dr. Linus Salmons, who noted good improvement and CRP and ESR compared to before and he has stopped her antibiotic as per plans to finish on September 4. The patient was encouraged to continue with wound care and consider hyperbaric oxygen therapy. Today she tells me that she has consented to undergo hyperbaric oxygen therapy and we can start the paperwork. 01/02/2016 -- her PCP had gained about 3 years but she still persists in having problems during hyperbaric oxygen therapy with some discomfort in the ears. 01/09/16; pressure area with underlying osteomyelitis in the left buttock. Wound bed itself has some slight amount of grayish surface slough however I do not think any debridement was necessary. There is no exposed bone soft tissue appears stable. She is using a wound VAC 01/16/16; back for weekly wound review in conjunction with HBO. She has a deep wound over the left initial tuberosity previously treated with 6 weeks of IV antibiotics for osteomyelitis. Wound bed looks reasonably healthy although the base of this is still precariously close to bone. She has been using a wound VAC. 01/23/2016 -- she has completed her course of antibiotics and this week the only new thing is her right great toe nail was avulsed and she has got an open wound over the nailbed. 01/31/16 she has completed her course of antibiotics. Her right great toenail  avulsed last week and she's been using silver alginate for this as well. 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 Did not sound like there was any optimistic discussion regarding reconstruction and the net result would be to continue with the wound VAC application. I will await the official reports. Addendum: -- she was seen at [facility] surgery [facility] by Dr. Tressa Busman. After a thorough review and from what I understand spending 45 minutes with the patient his assessment has been noted by me in detail and the management options were: 1. Continued pressure offloading and wound care versus operative procedures including wound excision 2. Soft tissue and bone sampling 3. If the wound gets larger wound closure would be done using a variety of plastic surgical techniques including but not limited to skin substitute, possible skin graft, local versus regional flaps, negative pressure dressing application. 4. He discussed with her details of flap surgery and the risks associated 5. He made a comment that since the patient was operated on by Dr. Leland Johns of [facility] plastic surgery unit in [place] the patient may continue to follow-up there for further evaluation for surgical flap closure in the future. 03/19/2016 -- the patient continues to be rather depressed and frustrated  with her lack of rapid progress in healing this wound especially because she thought after hyperbaric oxygen therapy the wound would heal extremely fast. She now understands that was not the implied benefit on wound care which was the recommendation for hyperbaric oxygen therapy. I have had a lengthy discussion with the patient and her husband regarding her  options: 1. Continue with collagen and wound VAC for the primary dressing and offloading and all supportive care. 2. See Dr. Iran Planas for possible placement of Acell or Integra in the OR. 3. get a second opinion from a wound care center and surrounding regions/counties 05/07/2016 -- Note from Dr. Celedonio Miyamoto, who noted that the patient has declined flap surgery. She has discussed application of A cell, and try a few applications to see how the wound progresses. She is also recommended that we could apply products here in the wound center, like Oasis. during her preop workup it was found that her hemoglobin A1c was 11% and she has now been diagnosed as having diabetes mellitus and has been put on appropriate treatment by her PCP 05/28/2016 -- tells me her blood sugars have been doing well and she has an appointment to see her PCP in the next couple of weeks to check her hemoglobin A1c. Other than that she continues to do well. 06/25/2016 -- have not seen her back for the last month but she says her health has been about the same and she has an appointment to check the A1c next week 09/10/16 ---- was seen by Dr. Celedonio Miyamoto -- who applied Acell and saw her back in follow-up. She has recommended silver alginate to the wound every other day and cover with foam. If no significant drainage could transition to collagen every other day. She recommended discontinuing wound VAC. There were no plans to repeat application of Acell. The patient expressed that her husband could do the wound care as going to the Wound Ctr., would cost several $100 for each visit. 10/21/2016 -- her insurance company is getting her new mattress and she is pleased about that. Other than that she has been doing dressings with PolyMem Silver and has been doing very well 02/18/2017 -- she has gone through several changes of her mattress and has not been pleased with any of them. The ventricles are still working on trying to  fit her with the appropriate low air-loss mattress. She has a new wound on the gluteal area which is clearly separated from the original wound. 03/25/17-she is here in follow-up evaluation for her left ischialpressure ulcer. She remains unsatisfied with her pressure mattress. She admits to sitting multiple hours a day, in the bed. We have discussed offloading options. The wound does not appear infected. Nutrition does not appear to be a concern. Will follow-up in 4 weeks, if wound continues to be stalled may consider x-ray to evaluate for refractory osteomyelitis. 04/21/17; this is a patient that I don't know all that well. She has a chronic wound which at one point had underlying osteomyelitis in the left ischial tuberosity. This is a stage IV pressure ulcer. Over the last 3 months she has a stage II wound inferiorly to the original wound. The last time she was here her dressing was changed to silver collagen although the patient's husband who changes the dressing said that the collagen stuck to the wound and remove skin from the superficial area therefore he switched back to Anderson 05/13/17; this is a patient we've been following for a left  ischial tuberosity wound which was stage IV at one point had underlying osteomyelitis. Over the last several months she's had a stage II wound just inferior and medial to the related to the wound. According to her husband he is using Endoform layer with collagen although this is not what I had last time. According to her husband they are using Elgie Congo with collagen although I don't quite know how that started. She was hospitalized from 1/20 through 04/30/16. This was related to a UTI. Her blood cultures were negative, urine culture showed multiple species. She did have a CT scan of the abdomen and pelvis which documented chronic osteomyelitis in the area of the wound inflammatory markers were unremarkable. She has had prior knowledge of osteomyelitis. It  looks as though she received IV antibiotics in 2017 and was treated with a course of hyperbaric oxygen. 05/28/17; the wound over the left ischial tuberosity is deeper today and abuts clearly on bone. Nursing intake reported drainage. I therefore culture of the wound. The more superficial area just below this looks about the same. They once again complained that there are mattress cover is not working although apparently [facility] is been noted to see this many times in the report is that the device is functional 06/18/17; the patient had a probing area on the left ischial tuberosity that was draining purulent fluid last time. This also clearly seemed to have open bone. Culture I did showed pansensitive pseudomonas including third generation cephalosporins. I treated this with cefdinir 300 twice a day for 10 days and things seem to have improved. She has a more superficial wound just underneath this area. Amazingly she has a new air mattress through [facility]. I think they gave this to her as a parking give. In any case this now works according to the patient may have something to do with why the areas are looking better. 07/09/17; the patient has a probing area in the left ischial tuberosity that still has some depth. However this is contracted in terms of the wound orifice although the depth is still roughly the same. There is no undermining. She also has the satellite wound which is more superficial. This appears to have a healthy surface we've been using silver collagen 08/06/17; the patient's wound is over the left ischial tuberosity and a satellite lesion just underneath this. The original wound was actually a deep stage 4 wound. We have made good progress in 2 months and there is no longer exposed bone here. 09/03/17; left ischial tuberosity actually appears to be quite healthy. I think we are making progress. No debridement is required. There is no surrounding erythema 10/01/17 I  follow this patient monthly for her left ischial tuberosity wound. There is 2 areas the original area and a satellite area. The satellite area looks a lot better there is no surrounding erythema. Her husband relates that he is having trouble maintaining the dressing. This has to do with the soft tissue around it. He states he puts the collagen in but he cannot make sure that it stays in even with the ABD pads and tape that he is been using 10/29/17; patient arrives with a better looking noon today. Some of the satellite lesions have closed. using Prisma 11/26/17; the patient has a large cone-shaped area with the tip of the Cone deep within her buttock soft tissue. The walls of the Cone are epithelialized however the base is still open. The area at the base of this looks  moist we've been using silver collagen. Will change to silver alginate 12/31/2017; the wound appears to have come in fairly nicely. Using silver alginate. There is no surrounding maceration or infection 01/28/18; there is still an open area here over the left initial tuberosity. Base of this however looks healthy. There is no surrounding infection 02/25/18; the area of its open is over the left ischial tuberosity. The base of this is where the wound is. This is a large inverted cone-shaped area with the wound at the tip. Dimensions of the wound at the tip are improved. There is a area of denuded skin about halfway towards the tip which her husband thinks may have happened today when he was bathing her. 04/20/17; the area is still open over the left initial tuberosity. 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 This is now completely closed over although it is closed over with a divot and skin over bone at the base of this. Continued aggressive  offloading will be necessary. 12/30/2018 READMISSION This is a 69 year old woman with chronic paraplegia. I picked her up for her care from Dr. Con Memos in this clinic after he departed. She had a stage IV pressure wound over the left ischial tuberosity. She was treated twice for her underlying osteomyelitis and this I believe firstly in 2016 and again in 2017. There were some plans at some point for flap closure of this however she was discovered to have uncontrolled diabetes and I do not think this was ever accomplished. She ultimately healed over in this clinic and was discharged in May. She has a large cone-shaped indentation with the tip of this going towards the left ischial tuberosity. It is not an easy area to examine but at that time I thought all of this was epithelialized. Apparently there was a reopening here shortly after she left the clinic last time. She was admitted to hospital at the end of June for Klebsiella bacteremia felt to be secondary to UTI. A CT scan of the pelvis is listed below and there was initially some concern that she had underlying osteomyelitis although I believe she was seen by infectious disease and that was felt to be not the case: I do not see any new cultures or inflammatory markers IMPRESSION: 1. No CT evidence for acute intra-abdominal or pelvic abnormality. Large volume of stool throughout the colon. 2. Enlarged fatty liver with fat sparing near the gallbladder fossa 3. Cortical scarring right kidney. Bilateral intrarenal stones without hydronephrosis. Thick-walled urinary bladder decompressed by suprapubic catheter 4. Deep left decubitus ulcer with underlying left ischial changes suggesting osteomyelitis. Her husband has been using silver collagen in the wound. She has not been systemically unwell no fever chills eating and drinking well. They rigorously offload this wound only getting up in the wheelchair when she is going to appointments the rest of the  time she is in bed. 10/8; wound measures larger and she now has exposed bone. We have been using silver alginate 11/12 still using silver alginate. The patient saw Dr. Megan Salon of infectious disease. She was started on Augmentin 500 mg twice daily. She is due to follow-up with Dr. Megan Salon I believe next week. Lab work Dr. Megan Salon requested showed a sedimentation rate of 28 and CRP of 20 although her CRP 1 year ago  was 18.8. Sedimentation rate 1 year ago was 11 basic metabolic panel showed a creatinine of 1.12 12/3; the patient followed up with Dr. Megan Salon yesterday. She is still on Augmentin twice daily. This was directed by Dr. Megan Salon. The patient's inflammatory markers have improved which is gratifying. Her C-reactive protein was repeated yesterday and follow-up booked with infectious disease in January. In addition I have been getting secure text messages I think from palliative care through [facility] [employer]. I think they were hoping to provide services to the patient in her home. They could not get a hold of the primary physician and so they reached out to me on 2 separate occasions. 12/17; patient last saw Dr. Megan Salon on 12/2. She is finishing up with Augmentin. Her C-reactive protein was 20 on 10/21, 10.1 on 11/19 and 17 on 12/2. The wound itself still has depth and undermining. We are using Santyl with the backing wet-to-dry 04/27/2019. The wound is gradually clearing up in terms of the surface although it is not filled in that much. Still abuts right against bone 2/4; patient with a deep pressure ulcer over the left ischial tuberosity. I thought she was going to follow-up with infectious disease to follow her inflammatory markers although the patient states that they stated that they did not need to see her unless we felt it was necessary. I will need to check their notes. In any case we ordered moistened silver collagen back with wet-to-dry to fill in the depth of  the wound although apparently [facility] sent silver alginate which they have been using since they were here the last time. Is obviously not what we ordered. 2/25. Not much change in this wound it is over the left ischial tuberosity recurrent wound. We have been using silver collagen with backing wet-to-dry. I think the wound is about the same. There is still some tunneling from about 10-12 o'clock over the ischial tuberosity itself 3/11; pressure ulcer over the left ischial tuberosity. Since she was last here the wound VAC was started and apparently going quite well. We are able to get the home health company that accepts [place] healthcare which is in itself sometimes problematic. There is been improvements in the wound the tunneling seems to be better and is contracted nicely 4/8; 1 month follow-up. Since she was last here we have been using silver collagen under a wound VAC. Some minor contraction I think in wound volume. She is cared for diligently by her husband including pressure relief, incontinence management, nutritional support etc. 6/1; this is almost a 26-monthfollow-up. She is been using silver collagen under wound VAC. Circular area over the left ischial tuberosity. She has been using silver collagen under wound VAC 7/8; 1 month follow-up. Silver collagen under the VAC not really a lot of progress. Tissue at the base of the wound which is right against bone and the tissue next that this does not look completely viable. She is not currently on any antibiotics, she had underlying osteomyelitis I need to look this over 8/16; we are using silver collagen under wound VAC to the left ischial tuberosity wound. Comes in today with absolutely no change in surface area or depth. There is no exposed bone. I did look over her infectious disease notes as I said I would do last time. She last saw Dr. CMegan Salonin December 2020. She completed 6 weeks of Augmentin. This was in response to a bone culture I  did showing methicillin susceptible staph aureus  and Enterococcus. She was supposed to come back to see Dr. Megan Salon at some point although they say that that appointment was canceled unless I chose to recommend return. I think there was supposed to be follow-up with inflammatory markers but I cannot see that that was ever done. She has not been on antibiotics since 9/21; monthly follow-up. We received a call from home health nurse last evening to report green drainage coming out of the wound. Lab work I ordered last time showed a white count of 5.2 a sedimentation rate of 45 and a C-reactive protein of 25 however neither one of the 2 values are substantially different from her previous values in October 2020 or December 2020. Both are slightly higher but only marginally. Otherwise no new complaints from the patient or her husband 10/19; 1 month follow-up. PCR culture I did last time showed medium quantities of Pseudomonas lower quantities Klebsiella and Enterococcus faecalis group B strep and Peptostreptococcus. I gave her Augmentin for 2 weeks. I am not really sure of my choice of this I would not cover Pseudomonas. She is still having green drainage. Wound itself looks satisfactory there is not a lot of depth wound bed looks Electronic Signature(s) Signed: 01/24/2020 4:22:08 PM By: Linton Ham MD Entered By: Linton Ham on 01/24/2020 11:31:21 -------------------------------------------------------------------------------- Physical Exam Details Patient Name: Date of Service: [name]. 01/24/2020 10:45 A M Medical Record Number: [id number] Patient Account Number: [id number] Date of Birth/Sex: Treating RN: 30-Oct-1950 (69 y.o. [name] Primary Care Provider: [name] Other Clinician: Referring Provider: Treating Provider/Extender: Tommy Rainwater in Treatment: 55 Constitutional Sitting or standing Blood Pressure is within target range for patient..  Pulse regular and within target range for patient.Marland Kitchen Respirations regular, non-labored and within target range.. Temperature is normal and within the target range for the patient.Marland Kitchen Appears in no distress. Notes Wound exam; left ischial tuberosity. Not a lot of depth of this in general the surface of the wound looks clean with not a lot of undermining or tunneling. No surrounding tenderness or erythema Electronic Signature(s) Signed: 01/24/2020 4:22:08 PM By: Linton Ham MD Entered By: Linton Ham on 01/24/2020 11:34:23 -------------------------------------------------------------------------------- Physician Orders Details Patient Name: Date of Service: [name]. 01/24/2020 10:45 A M Medical Record Number: [id number] Patient Account Number: [id number] Date of Birth/Sex: Treating RN: Sep 20, 1950 (69 y.o. [name], [name] Primary Care Provider: [name] Other Clinician: Referring Provider: Treating Provider/Extender: Tommy Rainwater in Treatment: 74 Verbal / Phone Orders: No Diagnosis Coding ICD-10 Coding Code Description [phone] Pressure ulcer of left buttock, stage 4 G82.20 Paraplegia, unspecified E11.622 Type 2 diabetes mellitus with other skin ulcer M86.68 Other chronic osteomyelitis, other site Follow-up Appointments Return appointment in 1 month. Dressing Change Frequency Wound #12 Left Ischial Tuberosity Change Dressing every other day. Skin Barriers/Peri-Wound Care Skin Prep - to periwound Wound Cleansing Wound #12 Left Ischial Tuberosity Clean wound with Normal Saline. - or wound cleanser Primary Wound Dressing Wound #12 Left Ischial Tuberosity lginate with Silver - apply gentamycin ointment under the alginate Ag. Calcium A Secondary Dressing Wound #12 Left Ischial Tuberosity Foam Border - or ABD pad and tape Off-Loading Low air-loss mattress (Group 2) Gel wheelchair cushion Turn and reposition every 2 hours Additional Orders /  Instructions Other: - pick up gentamycin ointment from pharmacy. [facility] for wound care. - [facility] Patient Medications llergies: Iodinated Contrast Media - IV Dye, aspirin, Sulfa (Sulfonamide Antibiotics)  A Notifications Medication Indication Start End 01/24/2020 gentamicin DOSE topical 0.1 % cream - cream topical apply to wound with dressing changes Electronic Signature(s) Signed: 01/24/2020 11:38:15 AM By: Linton Ham MD Signed: 01/24/2020 11:38:15 AM By: Linton Ham MD Entered By: Linton Ham on 01/24/2020 11:38:14 -------------------------------------------------------------------------------- Problem List Details Patient Name: Date of Service: [name]. 01/24/2020 10:45 A M Medical Record Number: [id number] Patient Account Number: [id number] Date of Birth/Sex: Treating RN: Oct 16, 1950 (69 y.o. [name], [name] Primary Care Provider: [name] Other Clinician: Referring Provider: Treating Provider/Extender: Tommy Rainwater in Treatment: 26 Active Problems ICD-10 Encounter Code Description Active Date MDM Diagnosis 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, stage 4 12/30/2018 No Yes G82.20 Paraplegia, unspecified 12/30/2018 No Yes E11.622 Type 2 diabetes mellitus with other skin ulcer 12/30/2018 No Yes M86.68 Other chronic osteomyelitis, other site 02/17/2019 No Yes Inactive Problems Resolved Problems Electronic Signature(s) Signed: 01/24/2020 4:22:08 PM By: Linton Ham MD Entered By: Linton Ham on 01/24/2020 11:29:11 -------------------------------------------------------------------------------- Progress Note Details Patient Name: Date of Service: [name]. 01/24/2020 10:45 A M Medical Record Number: [id number] Patient Account Number: [id number] Date of Birth/Sex: Treating RN: [birth year] (69 y.o. [name] Primary Care Provider: [name] Other  Clinician: Referring Provider: Treating Provider/Extender: Tommy Rainwater in Treatment: 62 Subjective History of Present Illness (HPI) The following HPI elements were documented for the patient's wound: Location: open ulceration of the left gluteal area, left heel and right ankle for about 5 months. Quality: Patient reports No Pain. Severity: Patient states wound(s) are getting worse. Duration: Patient has had the wound for > 5 months prior to seeking treatment at the wound center Context: The wound occurred when the patient has been paraplegic for about 3 years. Modifying Factors: Wound improving due to current treatment. Associated Signs and Symptoms: Patient reports having foul odor. this 69 year old patient who is known to have hypertension, hypothyroidism, breast cancer, chronic pain syndrome, paraplegia was noted to have a left gluteal decubitus ulcer and was brought into the hospital. During the course of her hospitalization she was debrided in the operating room by Dr. Leland Johns and had all the wounds sharply debrided. The debridement was done for the left ischial wound, the left heel wound and the right ankle wound. Bone cultures were taken at that time but were negative but clinically she was treated for osteomyelitis because of the probing down to bone and open exposed bone. Home health has been giving her antibioticss which include vancomycin and Zosyn. The patient was a smoker until about 3 weeks ago and used to smoke about 10 cigarettes a day for a long while. 12/13/2014 - details of her operative note from 11/03/2014 were reviewed -- PROCEDURE: 1. Excisional debridement skin, subcutaneous, muscle left ischium 35 cm2 2. Excisional debridement skin, subcutaneous tissue left heel 27 cm2 3. Excisional debridement right ankle skin, subcutaneous, bone 30 cm2 01/24/2015 -- she has some issues with her wheelchair cushion but other than that is doing very well and has  received Podus boots for her feet. 02/14/2015 -- she was using her old offloading boots and this seemed to have caused her a new pressure ulcer on the left posterior heel near the superior part just below the Achilles tendon. 03/07/2015 -- she has a new ulceration just to the left of the midline on her sacral region more on the left buttock and this has been there for about a week. 08/22/2015 -- was recently admitted to  hospital between May 5 and 08/13/2015, with sepsis and leukocytosis due to a UTI. she was treated for a sepsis complicating Escherichia coli UTI and kidney stones. She also had metabolic and careful up at the secondary to pyelonephritis. He received broad-spectrum antibiotics initially and then received Macrobid as per urology. She was sent home on nitrofurantoin. during her admission she had a CT scan which showed exposed left ischial tuberosity without evidence of osteolysis. 09/12/2015-- the patient is having some issues with her air mattress and would like to get a opinion from medical modalities. 10/10/2015 -- the issue with her air mattress has not yet been sorted out and the new problem seems to be a lot of odor from the wound VAC. 11/27/2015 -- the patient was admitted to the hospital between July 23 and 10/31/2015. Her problems were sepsis, osteomyelitis of the pelvic bone and acute pyelonephritis. CT of the abdomen and pelvis was consistent with a left-sided pyelonephritis with hydronephrosis and also just showed new sclerosis of the posterior portion of the left anterior pubic ramus suggestive of periosteal reaction consistent with osteomyelitis. She was treated for the osteomyelitis with infectious disease consult recommending 6 weeks of IV antibiotics including vancomycin and Rocephin and the antibiotics were to go on until 12/10/2015. He was seen by Dr. Leta Baptist plastic surgery and Dr. Luciana Axe of infectious disease. She had a suprapubic catheter placed during the  admission. CT scan done on 10/28/2015 showed specifically -- New sclerosis of the posterior portion of the left inferior pubic ramus with aggressive periosteal reaction, consistent with osteomyelitis, with adjacent soft tissue gas compatible with previously described decubitus ulcer. 12/12/2015 -- she was recently seen by Dr. Luciana Axe, who noted good improvement and CRP and ESR compared to before and he has stopped her antibiotic as per plans to finish on September 4. The patient was encouraged to continue with wound care and consider hyperbaric oxygen therapy. Today she tells me that she has consented to undergo hyperbaric oxygen therapy and we can start the paperwork. 01/02/2016 -- her PCP had gained about 3 years but she still persists in having problems during hyperbaric oxygen therapy with some discomfort in the ears. 01/09/16; pressure area with underlying osteomyelitis in the left buttock. Wound bed itself has some slight amount of grayish surface slough however I do not think any debridement was necessary. There is no exposed bone soft tissue appears stable. She is using a wound VAC 01/16/16; back for weekly wound review in conjunction with HBO. She has a deep wound over the left initial tuberosity previously treated with 6 weeks of IV antibiotics for osteomyelitis. Wound bed looks reasonably healthy although the base of this is still precariously close to bone. She has been using a wound VAC. 01/23/2016 -- she has completed her course of antibiotics and this week the only new thing is her right great toe nail was avulsed and she has got an open wound over the nailbed. 01/31/16 she has completed her course of antibiotics. Her right great toenail avulsed last week and she's been using silver alginate for this as well. Still using a wound VAC to the substantial stage IV wound over the left ischial tuberosity 03/05/2016 -- the patient has had a opinion from the plastic surgery group at [facility]  and details of this are not available yet but the patient's verbal report has been heard by me. Did not sound like there was any optimistic discussion regarding reconstruction and the net result would be to continue with  the wound VAC application. I will await the official reports. Addendum: -- she was seen at [facility] by Dr. Tressa Busman. After a thorough review and from what I understand spending 45 minutes with the patient his assessment has been noted by me in detail and the management options were: 1. Continued pressure offloading and wound care versus operative procedures including wound excision 2. Soft tissue and bone sampling 3. If the wound gets larger wound closure would be done using a variety of plastic surgical techniques including but not limited to skin substitute, possible skin graft, local versus regional flaps, negative pressure dressing application. 4. He discussed with her details of flap surgery and the risks associated 5. He made a comment that since the patient was operated on by Dr. Leland Johns of [facility] plastic surgery unit in [place] the patient may continue to follow-up there for further evaluation for surgical flap closure in the future. 03/19/2016 -- the patient continues to be rather depressed and frustrated with her lack of rapid progress in healing this wound especially because she thought after hyperbaric oxygen therapy the wound would heal extremely fast. She now understands that was not the implied benefit on wound care which was the recommendation for hyperbaric oxygen therapy. I have had a lengthy discussion with the patient and her husband regarding her options: 1. Continue with collagen and wound VAC for the primary dressing and offloading and all supportive care. 2. See Dr. Iran Planas for possible placement of Acell or Integra in the OR. 3. get a second opinion from a wound care center and surrounding  regions/counties 05/07/2016 -- Note from Dr. Celedonio Miyamoto, who noted that the patient has declined flap surgery. She has discussed application of A cell, and try a few applications to see how the wound progresses. She is also recommended that we could apply products here in the wound center, like Oasis. during her preop workup it was found that her hemoglobin A1c was 11% and she has now been diagnosed as having diabetes mellitus and has been put on appropriate treatment by her PCP 05/28/2016 -- tells me her blood sugars have been doing well and she has an appointment to see her PCP in the next couple of weeks to check her hemoglobin A1c. Other than that she continues to do well. 06/25/2016 -- have not seen her back for the last month but she says her health has been about the same and she has an appointment to check the A1c next week 09/10/16 ---- was seen by Dr. Celedonio Miyamoto -- who applied Acell and saw her back in follow-up. She has recommended silver alginate to the wound every other day and cover with foam. If no significant drainage could transition to collagen every other day. She recommended discontinuing wound VAC. There were no plans to repeat application of Acell. The patient expressed that her husband could do the wound care as going to the Wound Ctr., would cost several $100 for each visit. 10/21/2016 -- her insurance company is getting her new mattress and she is pleased about that. Other than that she has been doing dressings with PolyMem Silver and has been doing very well 02/18/2017 -- she has gone through several changes of her mattress and has not been pleased with any of them. The ventricles are still working on trying to fit her with the appropriate low air-loss mattress. She has a new wound on the gluteal area which is clearly separated from the  original wound. 03/25/17-she is here in follow-up evaluation for her left ischialpressure ulcer. She remains unsatisfied with her  pressure mattress. She admits to sitting multiple hours a day, in the bed. We have discussed offloading options. The wound does not appear infected. Nutrition does not appear to be a concern. Will follow-up in 4 weeks, if wound continues to be stalled may consider x-ray to evaluate for refractory osteomyelitis. 04/21/17; this is a patient that I don't know all that well. She has a chronic wound which at one point had underlying osteomyelitis in the left ischial tuberosity. This is a stage IV pressure ulcer. Over the last 3 months she has a stage II wound inferiorly to the original wound. The last time she was here her dressing was changed to silver collagen although the patient's husband who changes the dressing said that the collagen stuck to the wound and remove skin from the superficial area therefore he switched back to [place] 05/13/17; this is a patient we've been following for a left ischial tuberosity wound which was stage IV at one point had underlying osteomyelitis. Over the last several months she's had a stage II wound just inferior and medial to the related to the wound. According to her husband he is using Endoform layer with collagen although this is not what I had last time. According to her husband they are using Elgie [place] with collagen although I don't quite know how that started. She was hospitalized from 1/20 through 04/30/16. This was related to a UTI. Her blood cultures were negative, urine culture showed multiple species. She did have a CT scan of the abdomen and pelvis which documented chronic osteomyelitis in the area of the wound inflammatory markers were unremarkable. She has had prior knowledge of osteomyelitis. It looks as though she received IV antibiotics in 2017 and was treated with a course of hyperbaric oxygen. 05/28/17; the wound over the left ischial tuberosity is deeper today and abuts clearly on bone. Nursing intake reported drainage. I therefore culture of  the wound. The more superficial area just below this looks about the same. They once again complained that there are mattress cover is not working although apparently [facility] is been noted to see this many times in the report is that the device is functional 06/18/17; the patient had a probing area on the left ischial tuberosity that was draining purulent fluid last time. This also clearly seemed to have open bone. Culture I did showed pansensitive pseudomonas including third generation cephalosporins. I treated this with cefdinir 300 twice a day for 10 days and things seem to have improved. She has a more superficial wound just underneath this area. Amazingly she has a new air mattress through [facility]. I think they gave this to her as a parking give. In any case this now works according to the patient may have something to do with why the areas are looking better. 07/09/17; the patient has a probing area in the left ischial tuberosity that still has some depth. However this is contracted in terms of the wound orifice although the depth is still roughly the same. There is no undermining. ooShe also has the satellite wound which is more superficial. This appears to have a healthy surface we've been using silver collagen 08/06/17; the patient's wound is over the left ischial tuberosity and a satellite lesion just underneath this. The original wound was actually a deep stage 4 wound. We have made good progress in 2 months and  there is no longer exposed bone here. 09/03/17; left ischial tuberosity actually appears to be quite healthy. I think we are making progress. No debridement is required. There is no surrounding erythema 10/01/17 I follow this patient monthly for her left ischial tuberosity wound. There is 2 areas the original area and a satellite area. The satellite area looks a lot better there is no surrounding erythema. Her husband relates that he is having trouble maintaining  the dressing. This has to do with the soft tissue around it. He states he puts the collagen in but he cannot make sure that it stays in even with the ABD pads and tape that he is been using 10/29/17; patient arrives with a better looking noon today. Some of the satellite lesions have closed. using Prisma 11/26/17; the patient has a large cone-shaped area with the tip of the Cone deep within her buttock soft tissue. The walls of the Cone are epithelialized however the base is still open. The area at the base of this looks moist we've been using silver collagen. Will change to silver alginate 12/31/2017; the wound appears to have come in fairly nicely. Using silver alginate. There is no surrounding maceration or infection 01/28/18; there is still an open area here over the left initial tuberosity. Base of this however looks healthy. There is no surrounding infection 02/25/18; the area of its open is over the left ischial tuberosity. The base of this is where the wound is. This is a large inverted cone-shaped area with the wound at the tip. Dimensions of the wound at the tip are improved. There is a area of denuded skin about halfway towards the tip which her husband thinks may have happened today when he was bathing her. 04/20/17; the area is still open over the left initial tuberosity. 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 This is now completely closed over although it is closed over with a divot and skin over bone at the base of this. Continued aggressive offloading will be necessary. 12/30/2018 READMISSION This is a 69 year old woman with chronic paraplegia. I picked her up for her care from Dr. Con Memos in this clinic after he departed. She had a stage IV pressure wound over the left ischial tuberosity. She  was treated twice for her underlying osteomyelitis and this I believe firstly in 2016 and again in 2017. There were some plans at some point for flap closure of this however she was discovered to have uncontrolled diabetes and I do not think this was ever accomplished. She ultimately healed over in this clinic and was discharged in May. She has a large cone-shaped indentation with the tip of this going towards the left ischial tuberosity. It is not an easy area to examine but at that time I thought all of this was epithelialized. Apparently there was a reopening here shortly after she left the clinic last time. She was admitted to hospital at the end of June for Klebsiella bacteremia felt to be secondary to UTI. A CT scan of the pelvis is listed below and there was initially some concern that she had underlying osteomyelitis although I believe she was seen by infectious disease and that was felt to be not the case: I do not see any new cultures or inflammatory markers IMPRESSION: 1. No  CT evidence for acute intra-abdominal or pelvic abnormality. Large volume of stool throughout the colon. 2. Enlarged fatty liver with fat sparing near the gallbladder fossa 3. Cortical scarring right kidney. Bilateral intrarenal stones without hydronephrosis. Thick-walled urinary bladder decompressed by suprapubic catheter 4. Deep left decubitus ulcer with underlying left ischial changes suggesting osteomyelitis. Her husband has been using silver collagen in the wound. She has not been systemically unwell no fever chills eating and drinking well. They rigorously offload this wound only getting up in the wheelchair when she is going to appointments the rest of the time she is in bed. 10/8; wound measures larger and she now has exposed bone. We have been using silver alginate 11/12 still using silver alginate. The patient saw Dr. Megan Salon of infectious disease. She was started on Augmentin 500 mg twice daily. She is  due to follow-up with Dr. Megan Salon I believe next week. Lab work Dr. Megan Salon requested showed a sedimentation rate of 28 and CRP of 20 although her CRP 1 year ago was 18.8. Sedimentation rate 1 year ago was 11 basic metabolic panel showed a creatinine of 1.12 12/3; the patient followed up with Dr. Megan Salon yesterday. She is still on Augmentin twice daily. This was directed by Dr. Megan Salon. The patient's inflammatory markers have improved which is gratifying. Her C-reactive protein was repeated yesterday and follow-up booked with infectious disease in January. In addition I have been getting secure text messages I think from palliative care through [facility] [employer]. I think they were hoping to provide services to the patient in her home. They could not get a hold of the primary physician and so they reached out to me on 2 separate occasions. 12/17; patient last saw Dr. Megan Salon on 12/2. She is finishing up with Augmentin. Her C-reactive protein was 20 on 10/21, 10.1 on 11/19 and 17 on 12/2. The wound itself still has depth and undermining. We are using Santyl with the backing wet-to-dry 04/27/2019. The wound is gradually clearing up in terms of the surface although it is not filled in that much. Still abuts right against bone 2/4; patient with a deep pressure ulcer over the left ischial tuberosity. I thought she was going to follow-up with infectious disease to follow her inflammatory markers although the patient states that they stated that they did not need to see her unless we felt it was necessary. I will need to check their notes. In any case we ordered moistened silver collagen back with wet-to-dry to fill in the depth of the wound although apparently [facility] sent silver alginate which they have been using since they were here the last time. Is obviously not what we ordered. 2/25. Not much change in this wound it is over the left ischial tuberosity recurrent wound. We have  been using silver collagen with backing wet-to-dry. I think the wound is about the same. There is still some tunneling from about 10-12 o'clock over the ischial tuberosity itself 3/11; pressure ulcer over the left ischial tuberosity. Since she was last here the wound VAC was started and apparently going quite well. We are able to get the home health company that accepts [place] healthcare which is in itself sometimes problematic. There is been improvements in the wound the tunneling seems to be better and is contracted nicely 4/8; 1 month follow-up. Since she was last here we have been using silver collagen under a wound VAC. Some minor contraction I think in wound volume. She is cared for diligently  by her husband including pressure relief, incontinence management, nutritional support etc. 6/1; this is almost a 43-month follow-up. She is been using silver collagen under wound VAC. Circular area over the left ischial tuberosity. She has been using silver collagen under wound VAC 7/8; 1 month follow-up. Silver collagen under the VAC not really a lot of progress. Tissue at the base of the wound which is right against bone and the tissue next that this does not look completely viable. She is not currently on any antibiotics, she had underlying osteomyelitis I need to look this over 8/16; we are using silver collagen under wound VAC to the left ischial tuberosity wound. Comes in today with absolutely no change in surface area or depth. There is no exposed bone. I did look over her infectious disease notes as I said I would do last time. She last saw Dr. Megan Salon in December 2020. She completed 6 weeks of Augmentin. This was in response to a bone culture I did showing methicillin susceptible staph aureus and Enterococcus. She was supposed to come back to see Dr. Megan Salon at some point although they say that that appointment was canceled unless I chose to recommend return. I think there was supposed to be  follow-up with inflammatory markers but I cannot see that that was ever done. She has not been on antibiotics since 9/21; monthly follow-up. We received a call from home health nurse last evening to report green drainage coming out of the wound. Lab work I ordered last time showed a white count of 5.2 a sedimentation rate of 45 and a C-reactive protein of 25 however neither one of the 2 values are substantially different from her previous values in October 2020 or December 2020. Both are slightly higher but only marginally. Otherwise no new complaints from the patient or her husband 10/19; 1 month follow-up. PCR culture I did last time showed medium quantities of Pseudomonas lower quantities Klebsiella and Enterococcus faecalis group B strep and Peptostreptococcus. I gave her Augmentin for 2 weeks. I am not really sure of my choice of this I would not cover Pseudomonas. She is still having green drainage. Wound itself looks satisfactory there is not a lot of depth wound bed looks Objective Constitutional Sitting or standing Blood Pressure is within target range for patient.. Pulse regular and within target range for patient.Marland Kitchen Respirations regular, non-labored and within target range.. Temperature is normal and within the target range for the patient.Marland Kitchen Appears in no distress. Vitals Time Taken: 10:58 AM, Height: 63 in, Weight: 185 lbs, BMI: 32.8, Temperature: 98 F, Pulse: 86 bpm, Respiratory Rate: 18 breaths/min, Blood Pressure: 129/75 mmHg. General Notes: Wound exam; left ischial tuberosity. Not a lot of depth of this in general the surface of the wound looks clean with not a lot of undermining or tunneling. No surrounding tenderness or erythema Integumentary (Hair, Skin) Wound #12 status is Open. Original cause of wound was Pressure Injury. The wound is located on the Left Ischial Tuberosity. The wound measures 3cm length x 1cm width x 1.3cm depth; 2.356cm^2 area and 3.063cm^3 volume. There is  Fat Layer (Subcutaneous Tissue) exposed. There is no tunneling or undermining noted. There is a medium amount of serosanguineous drainage noted. The wound margin is well defined and not attached to the wound base. There is large (67- 100%) red, pink granulation within the wound bed. There is no necrotic tissue within the wound bed. Assessment Active Problems ICD-10 Pressure ulcer of left buttock, stage 4 Paraplegia, unspecified Type  2 diabetes mellitus with other skin ulcer Other chronic osteomyelitis, other site Plan Follow-up Appointments: Return appointment in 1 month. Dressing Change Frequency: Wound #12 Left Ischial Tuberosity: Change Dressing every other day. Skin Barriers/Peri-Wound Care: Skin Prep - to periwound Wound Cleansing: Wound #12 Left Ischial Tuberosity: Clean wound with Normal Saline. - or wound cleanser Primary Wound Dressing: Wound #12 Left Ischial Tuberosity: Calcium Alginate with Silver - apply gentamycin ointment under the alginate Ag. Secondary Dressing: Wound #12 Left Ischial Tuberosity: Foam Border - or ABD pad and tape Off-Loading: Low air-loss mattress (Group 2) Gel wheelchair cushion Turn and reposition every 2 hours Additional Orders / Instructions: Other: - pick up gentamycin ointment from pharmacy. Home Health: [facility] for wound [facility]. - [facility] The following medication(s) was prescribed: gentamicin topical 0.1 % cream cream topical apply to wound with dressing changes starting 01/24/2020 1. I am really not certain why I chose Augmentin here without either additional antibiotics 2. The wound does not look unhealthy. I am going to give her topical gentamicin and change the primary dressing to silver alginate 3. I am not opposed to systemic treatment for the Pseudomonas however I would like to see what topical therapy does first Electronic Signature(s) Signed: 01/24/2020 11:40:01 AM By: Linton Ham MD Entered By:  Linton Ham on 01/24/2020 11:40:01 -------------------------------------------------------------------------------- SuperBill Details Patient Name: Date of Service: [name]. 01/24/2020 Medical Record Number: [id number] Patient Account Number: [id number] Date of Birth/Sex: Treating RN: [birth year] (69 y.o. [name], [name] Primary Care Provider: [name] Other Clinician: Referring Provider: Treating Provider/Extender: Tommy Rainwater in Treatment: 55 Diagnosis Coding ICD-10 Codes Code Description [phone] Pressure ulcer of left buttock, stage 4 G82.20 Paraplegia, unspecified E11.622 Type 2 diabetes mellitus with other skin ulcer M86.68 Other chronic osteomyelitis, other site Facility Procedures The patient participates with Medicare or their insurance follows the Medicare Facility Guidelines: CPT4 Code Description Modifier Quantity [id number] [place] VISIT-LEV 3 EST PT 1 Physician Procedures : CPT4 Code Description Modifier [id number] - WC PHYS LEVEL 3 - EST PT ICD-10 Diagnosis Description 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, stage 4 E11.622 Type 2 diabetes mellitus with other skin ulcer G82.20 Paraplegia, unspecified Quantity: 1 Electronic Signature(s) Signed: 01/24/2020 4:22:08 PM By: Linton Ham MD Signed: 01/24/2020 5:04:07 PM By: Deon Pilling Entered By: Deon Pilling on 01/24/2020 11:44:06

## 2020-01-24 NOTE — Progress Notes (Signed)
[name] ([id number]) Visit Report for 01/24/2020 Arrival Information Details Patient Name: Date of Service: [name], [name] 01/24/2020 10:45 A M Medical Record Number: [id number] Patient Account Number: [id number] Date of Birth/Sex: Treating RN: [name] (69 y.o. [name] Primary Care [name]: [name] Other Clinician: Referring [name]: Treating [name]/Extender: [name] in Treatment: 53 Visit Information History Since Last Visit Added or deleted any medications: No Patient Arrived: Wheel Chair Any new allergies or adverse reactions: No Arrival Time: 10:57 Had a fall or experienced change in No Accompanied By: husband activities of daily living that may affect Transfer Assistance: Manual risk of falls: Patient Identification Verified: Yes Signs or symptoms of abuse/neglect since last visito No Secondary Verification Process Completed: Yes Hospitalized since last visit: No Patient Requires Transmission-Based Precautions: No Implantable device outside of the clinic excluding No Patient Has Alerts: No cellular tissue based products placed in the center since last visit: Has Dressing in Place as Prescribed: Yes Pain Present Now: No Electronic Signature(s) Signed: 01/24/2020 11:49:38 AM By: [name] Entered By: [name] on 01/24/2020 10:58:18 -------------------------------------------------------------------------------- Clinic Level of Care Assessment Details Patient Name: Date of Service: [name], [name] 01/24/2020 10:45 A M Medical Record Number: [id number] Patient Account Number: [id number] Date of Birth/Sex: Treating RN: [name] (69 y.o. [name] Primary Care [name]: [name] Other Clinician: Referring Landy Mace: Treating [name]/Extender: [name] in Treatment: 55 Clinic Level of Care Assessment Items TOOL 4 Quantity Score X- 1 0 Use when only an EandM  is performed on FOLLOW-UP visit ASSESSMENTS - Nursing Assessment / Reassessment X- 1 10 Reassessment of Co-morbidities (includes updates in patient status) X- 1 5 Reassessment of Adherence to Treatment Plan ASSESSMENTS - Wound and Skin A ssessment / Reassessment X - Simple Wound Assessment / Reassessment - one wound 1 5 []  - 0 Complex Wound Assessment / Reassessment - multiple wounds []  - 0 Dermatologic / Skin Assessment (not related to wound area) ASSESSMENTS - Focused Assessment []  - 0 Circumferential Edema Measurements - multi extremities X- 1 10 Nutritional Assessment / Counseling / Intervention []  - 0 Lower Extremity Assessment (monofilament, tuning fork, pulses) []  - 0 Peripheral Arterial Disease Assessment (using hand held doppler) ASSESSMENTS - Ostomy and/or Continence Assessment and Care []  - 0 Incontinence Assessment and Management []  - 0 Ostomy Care Assessment and Management (repouching, etc.) PROCESS - Coordination of Care X - Simple Patient / Family Education for ongoing care 1 15 []  - 0 Complex (extensive) Patient / Family Education for ongoing care X- 1 10 Staff obtains Consents, Records, T Results / Process Orders est X- 1 10 Staff telephones HHA, Nursing Homes / Clarify orders / etc X- 1 10 Routine Transfer to another Facility (non-emergent condition) []  - 0 Routine Hospital Admission (non-emergent condition) []  - 0 New Admissions / Biomedical engineer / Ordering NPWT Apligraf, etc. , []  - 0 Emergency Hospital Admission (emergent condition) X- 1 10 Simple Discharge Coordination []  - 0 Complex (extensive) Discharge Coordination PROCESS - Special Needs []  - 0 Pediatric / Minor Patient Management []  - 0 Isolation Patient Management []  - 0 Hearing / Language / Visual special needs []  - 0 Assessment of Community assistance (transportation, D/C planning, etc.) []  - 0 Additional assistance / Altered mentation []  - 0 Support Surface(s)  Assessment (bed, cushion, seat, etc.) INTERVENTIONS - Wound Cleansing / Measurement X - Simple Wound Cleansing - one wound 1 5 []  - 0 Complex Wound Cleansing -  multiple wounds X- 1 5 Wound Imaging (photographs - any number of wounds) []  - 0 Wound Tracing (instead of photographs) []  - 0 Simple Wound Measurement - one wound []  - 0 Complex Wound Measurement - multiple wounds INTERVENTIONS - Wound Dressings []  - 0 Small Wound Dressing one or multiple wounds X- 1 15 Medium Wound Dressing one or multiple wounds []  - 0 Large Wound Dressing one or multiple wounds []  - 0 Application of Medications - topical []  - 0 Application of Medications - injection INTERVENTIONS - Miscellaneous []  - 0 External ear exam []  - 0 Specimen Collection (cultures, biopsies, blood, body fluids, etc.) []  - 0 Specimen(s) / Culture(s) sent or taken to Lab for analysis []  - 0 Patient Transfer (multiple staff / Civil Service fast streamer / Similar devices) []  - 0 Simple Staple / Suture removal (25 or less) []  - 0 Complex Staple / Suture removal (26 or more) []  - 0 Hypo / Hyperglycemic Management (close monitor of Blood Glucose) []  - 0 Ankle / Brachial Index (ABI) - do not check if billed separately X- 1 5 Vital Signs Has the patient been seen at the hospital within the last three years: Yes Total Score: 115 Level Of Care: New/Established - Level 3 Electronic Signature(s) Signed: 01/24/2020 5:04:07 PM By: Deon Pilling Entered By: Deon Pilling on 01/24/2020 11:43:56 -------------------------------------------------------------------------------- Encounter Discharge Information Details Patient Name: Date of Service: [name]. 01/24/2020 10:45 A M Medical Record Number: [id number] Patient Account Number: [id number] Date of Birth/Sex: Treating RN: [name] (69 y.o. [name] Primary Care Luretta Everly: [name] Other Clinician: Referring Alayne Estrella: Treating Mercades Bajaj/Extender: [name] in Treatment: 42 Encounter Discharge Information Items Discharge Condition: Stable Ambulatory Status: Wheelchair Discharge Destination: Home Transportation: Private Auto Accompanied By: husband Schedule Follow-up Appointment: Yes Clinical Summary of Care: Patient Declined Electronic Signature(s) Signed: 01/24/2020 11:49:38 AM By: [name] Entered By: [name] on 01/24/2020 11:47:47 -------------------------------------------------------------------------------- Lower Extremity Assessment Details Patient Name: Date of Service: [name], [name] 01/24/2020 10:45 A M Medical Record Number: [id number] Patient Account Number: [id number] Date of Birth/Sex: Treating RN: [name] (69 y.o. [name] Primary Care Nashea Chumney: [name] Other Clinician: Referring Sandrine Bloodsworth: Treating Daron Breeding/Extender: [name] in Treatment: 67 Electronic Signature(s) Signed: 01/24/2020 11:49:38 AM By: [name] Entered By: [name] on 01/24/2020 10:59:03 -------------------------------------------------------------------------------- Multi Wound Chart Details Patient Name: Date of Service: [name], [name] Breach. 01/24/2020 10:45 A M Medical Record Number: [id number] Patient Account Number: [id number] Date of Birth/Sex: Treating RN: [birth year] [name] (69 y.o. [name] Primary Care Kimbree Casanas: [name] Other Clinician: Referring Romonda Parker: Treating Rhydian Baldi/Extender: [name] in Treatment: 34 Vital Signs Height(in): 63 Pulse(bpm): 86 Weight(lbs): 185 Blood Pressure(mmHg): 129/75 Body Mass Index(BMI): 33 Temperature(F): 98 Respiratory Rate(breaths/min): 18 Photos: [12:No Photos Left Ischial Tuberosity] [N/A:N/A N/A] Wound Location: [12:Pressure Injury] [N/A:N/A] Wounding Event: [12:Pressure Ulcer] [N/A:N/A] Primary Etiology: [12:Anemia, Hypertension, Type II] [N/A:N/A] Comorbid  History: [12:Diabetes, Osteoarthritis, Dementia, Paraplegia, Received Radiation 09/06/2018] [N/A:N/A] Date Acquired: [12:55] [N/A:N/A] Weeks of Treatment: [12:Open] [N/A:N/A] Wound Status: [12:3x1x1.3] [N/A:N/A] Measurements L x W x D (cm) [12:2.356] [N/A:N/A] A (cm) : rea [12:3.063] [N/A:N/A] Volume (cm) : [12:-898.30%] [N/A:N/A] % Reduction in A rea: [12:-1344.80%] [N/A:N/A] % Reduction in Volume: [12:Category/Stage IV] [N/A:N/A] Classification: [12:Medium] [N/A:N/A] Exudate A mount: [12:Serosanguineous] [N/A:N/A] Exudate Type: [12:red, brown] [N/A:N/A] Exudate Color: [12:Well defined, not attached] [N/A:N/A] Wound Margin: [12:Large (67-100%)] [N/A:N/A] Granulation A mount: [12:Red, Pink] [N/A:N/A] Granulation Quality: [12:None Present (  0%)] [N/A:N/A] Necrotic A mount: [12:Fat Layer (Subcutaneous Tissue): Yes N/A] Exposed Structures: [12:Fascia: No Tendon: No Muscle: No Joint: No Bone: No Small (1-33%)] [N/A:N/A] Treatment Notes Electronic Signature(s) Signed: 01/24/2020 4:22:08 PM By: Linton Ham MD Signed: 01/24/2020 5:04:07 PM By: Deon Pilling Entered By: Linton Ham on 01/24/2020 11:29:19 -------------------------------------------------------------------------------- Multi-Disciplinary Care Plan Details Patient Name: Date of Service: [name], [name] Breach. 01/24/2020 10:45 A M Medical Record Number: [id number] Patient Account Number: [id number] Date of Birth/Sex: Treating RN: [name] (69 y.o. [name] Primary Care Willis Holquin: [name] Other Clinician: Referring Dayan Kreis: Treating Sakib Noguez/Extender: [name] in Treatment: 55 Active Inactive Wound/Skin Impairment Nursing Diagnoses: Knowledge deficit related to ulceration/compromised skin integrity Goals: Patient/caregiver will verbalize understanding of skin care regimen Date Initiated: 12/30/2018 Target Resolution Date: 02/03/2020 Goal Status: Active Interventions: Assess  patient/caregiver ability to obtain necessary supplies Assess patient/caregiver ability to perform ulcer/skin care regimen upon admission and as needed Assess ulceration(s) every visit Provide education on ulcer and skin care Treatment Activities: Skin care regimen initiated : 12/30/2018 Topical wound management initiated : 12/30/2018 Notes: Electronic Signature(s) Signed: 01/24/2020 5:04:07 PM By: Deon Pilling Entered By: Deon Pilling on 01/24/2020 10:42:10 -------------------------------------------------------------------------------- Pain Assessment Details Patient Name: Date of Service: [name], [name] 01/24/2020 10:45 A M Medical Record Number: [id number] Patient Account Number: [id number] Date of Birth/Sex: Treating RN: [name] (69 y.o. [name] Primary Care Kristianne Albin: [name] Other Clinician: Referring Harrol Novello: Treating Mathayus Stanbery/Extender: [name] in Treatment: 55 Active Problems Location of Pain Severity and Description of Pain Patient Has Paino No Site Locations Pain Management and Medication Current Pain Management: Electronic Signature(s) Signed: 01/24/2020 11:49:38 AM By: [name] Entered By: [name] on 01/24/2020 10:58:57 -------------------------------------------------------------------------------- Patient/Caregiver Education Details Patient Name: Date of Service: [name] 10/19/2021andnbsp10:45 A M Medical Record Number: [id number] Patient Account Number: [id number] Date of Birth/Gender: Treating RN: [name] (69 y.o. [name] Primary Care Physician: [name] Other Clinician: Referring Physician: Treating Physician/Extender: [name] in Treatment: 16 Education Assessment Education Provided To: Patient Education Topics Provided Wound/Skin Impairment: Handouts: Skin Care Do's and Dont's Methods: Explain/Verbal Responses: Reinforcements  needed Electronic Signature(s) Signed: 01/24/2020 5:04:07 PM By: Deon Pilling Entered By: Deon Pilling on 01/24/2020 10:42:21 -------------------------------------------------------------------------------- Wound Assessment Details Patient Name: Date of Service: [name]. 01/24/2020 10:45 A M Medical Record Number: [id number] Patient Account Number: [id number] Date of Birth/Sex: Treating RN: [birth year]-[name] (69 y.o. [name] Primary Care Avagrace Botelho: [name] Other Clinician: Referring Anyae Griffith: Treating Mahealani Sulak/Extender: [name] in Treatment: 57 Wound Status Wound Number: 12 Primary Pressure Ulcer Etiology: Wound Location: Left Ischial Tuberosity Wound Open Wounding Event: Pressure Injury Status: Date Acquired: 09/06/2018 Comorbid Anemia, Hypertension, Type II Diabetes, Osteoarthritis, Weeks Of Treatment: 55 History: Dementia, Paraplegia, Received Radiation Clustered Wound: No Wound Measurements Length: (cm) 3 Width: (cm) 1 Depth: (cm) 1.3 Area: (cm) 2.356 Volume: (cm) 3.063 % Reduction in Area: -898.3% % Reduction in Volume: -1344.8% Epithelialization: Small (1-33%) Tunneling: No Undermining: No Wound Description Classification: Category/Stage IV Wound Margin: Well defined, not attached Exudate Amount: Medium Exudate Type: Serosanguineous Exudate Color: red, brown Foul Odor After Cleansing: No Slough/Fibrino No Wound Bed Granulation Amount: Large (67-100%) Exposed Structure Granulation Quality: Red, Pink Fascia Exposed: No Necrotic Amount: None Present (0%) Fat Layer (Subcutaneous Tissue) Exposed: Yes Tendon Exposed: No Muscle Exposed: No Joint Exposed: No Bone Exposed: No Treatment Notes Wound #12 (Left Ischial Tuberosity)  1. Cleanse With Wound Cleanser 2. Periwound Care Skin Prep 3. Primary Dressing Applied Calcium Alginate Ag Other primary dressing (specifiy in notes) 4. Secondary Dressing Foam Border  Dressing Notes gentamicin ointment then alginate Electronic Signature(s) Signed: 01/24/2020 11:49:38 AM By: [name] Entered By: [name] on 01/24/2020 11:01:39 -------------------------------------------------------------------------------- Vitals Details Patient Name: Date of Service: [name]. 01/24/2020 10:45 A M Medical Record Number: [id number] Patient Account Number: [id number] Date of Birth/Sex: Treating RN: [name][birth year] (69 y.o. [name] Primary Care Arieal Cuoco: [name] Other Clinician: Referring Jolayne Branson: Treating Tag Wurtz/Extender: [name] in Treatment: 55 Vital Signs Time Taken: 10:58 Temperature (F): 98 Height (in): 63 Pulse (bpm): 86 Weight (lbs): 185 Respiratory Rate (breaths/min): 18 Body Mass Index (BMI): 32.8 Blood Pressure (mmHg): 129/75 Reference Range: 80 - 120 mg / dl Electronic Signature(s) Signed: 01/24/2020 11:49:38 AM By: [name] Entered By: [name] on 01/24/2020 10:58:50

## 2020-02-21 ENCOUNTER — Other Ambulatory Visit: Payer: Self-pay

## 2020-02-21 ENCOUNTER — Encounter (HOSPITAL_BASED_OUTPATIENT_CLINIC_OR_DEPARTMENT_OTHER): Payer: Medicare Other | Attending: Internal Medicine | Admitting: Internal Medicine

## 2020-02-21 DIAGNOSIS — E11622 Type 2 diabetes mellitus with other skin ulcer: Secondary | ICD-10-CM | POA: Insufficient documentation

## 2020-02-21 DIAGNOSIS — L89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, stage 4: Secondary | ICD-10-CM | POA: Insufficient documentation

## 2020-02-21 DIAGNOSIS — G8220 Paraplegia, unspecified: Secondary | ICD-10-CM | POA: Diagnosis not present

## 2020-02-21 NOTE — Progress Notes (Signed)
[name], [name] ([id number]) Visit Report for 02/21/2020 HPI Details Patient Name: Date of Service: [name], [name] 02/21/2020 10:45 A M Medical Record Number: [id number] Patient Account Number: [id number] Date of Birth/Sex: Treating RN: 30-Jun-1950 (69 y.o. [name] Primary Care Provider: [name] Other Clinician: Referring Provider: Treating Provider/Extender: Tommy Rainwater in Treatment: 59 History of Present Illness Location: open ulceration of the left gluteal area, left heel and right ankle for about 5 months. Quality: Patient reports No Pain. Severity: Patient states wound(s) are getting worse. Duration: Patient has had the wound for > 5 months prior to seeking treatment at the wound center Context: The wound occurred when the patient has been paraplegic for about 3 years. Modifying Factors: Wound improving due to current treatment. ssociated Signs and Symptoms: Patient reports having foul odor. A HPI Description: this 69 year old patient who is known to have hypertension, hypothyroidism, breast cancer, chronic pain syndrome, paraplegia was noted to have a left gluteal decubitus ulcer and was brought into the hospital. During the course of her hospitalization she was debrided in the operating room by Dr. Leland Johns and had all the wounds sharply debrided. The debridement was done for the left ischial wound, the left heel wound and the right ankle wound. Bone cultures were taken at that time but were negative but clinically she was treated for osteomyelitis because of the probing down to bone and open exposed bone. Home health has been giving her antibioticss which include vancomycin and Zosyn. The patient was a smoker until about 3 weeks ago and used to smoke about 10 cigarettes a day for a long while. 12/13/2014 - details of her operative note from 11/03/2014 were reviewed -- PROCEDURE: 1. Excisional debridement skin, subcutaneous, muscle left ischium  35 cm2 2. Excisional debridement skin, subcutaneous tissue left heel 27 cm2 3. Excisional debridement right ankle skin, subcutaneous, bone 30 cm2 01/24/2015 -- she has some issues with her wheelchair cushion but other than that is doing very well and has received Podus boots for her feet. 02/14/2015 -- she was using her old offloading boots and this seemed to have caused her a new pressure ulcer on the left posterior heel near the superior part just below the Achilles tendon. 03/07/2015 -- she has a new ulceration just to the left of the midline on her sacral region more on the left buttock and this has been there for about a week. 08/22/2015 -- was recently admitted to hospital between May 5 and 08/13/2015, with sepsis and leukocytosis due to a UTI. she was treated for a sepsis complicating Escherichia coli UTI and kidney stones. She also had metabolic and careful up at the secondary to pyelonephritis. He received broad-spectrum antibiotics initially and then received Macrobid as per urology. She was sent home on nitrofurantoin. during her admission she had a CT scan which showed exposed left ischial tuberosity without evidence of osteolysis. 09/12/2015-- the patient is having some issues with her air mattress and would like to get a opinion from medical modalities. 10/10/2015 -- the issue with her air mattress has not yet been sorted out and the new problem seems to be a lot of odor from the wound VAC. 11/27/2015 -- the patient was admitted to the hospital between July 23 and 10/31/2015. Her problems were sepsis, osteomyelitis of the pelvic bone and acute pyelonephritis. CT of the abdomen and pelvis was consistent with a left-sided pyelonephritis with hydronephrosis and also just showed new sclerosis of the posterior portion of  the left anterior pubic ramus suggestive of periosteal reaction consistent with osteomyelitis. She was treated for the osteomyelitis with infectious disease consult  recommending 6 weeks of IV antibiotics including vancomycin and Rocephin and the antibiotics were to go on until 12/10/2015. He was seen by Dr. Iran Planas plastic surgery and Dr. Linus Salmons of infectious disease. She had a suprapubic catheter placed during the admission. CT scan done on 10/28/2015 showed specifically -- New sclerosis of the posterior portion of the left inferior pubic ramus with aggressive periosteal reaction, consistent with osteomyelitis, with adjacent soft tissue gas compatible with previously described decubitus ulcer. 12/12/2015 -- she was recently seen by Dr. Linus Salmons, who noted good improvement and CRP and ESR compared to before and he has stopped her antibiotic as per plans to finish on September 4. The patient was encouraged to continue with wound care and consider hyperbaric oxygen therapy. Today she tells me that she has consented to undergo hyperbaric oxygen therapy and we can start the paperwork. 01/02/2016 -- her PCP had gained about 3 years but she still persists in having problems during hyperbaric oxygen therapy with some discomfort in the ears. 01/09/16; pressure area with underlying osteomyelitis in the left buttock. Wound bed itself has some slight amount of grayish surface slough however I do not think any debridement was necessary. There is no exposed bone soft tissue appears stable. She is using a wound VAC 01/16/16; back for weekly wound review in conjunction with HBO. She has a deep wound over the left initial tuberosity previously treated with 6 weeks of IV antibiotics for osteomyelitis. Wound bed looks reasonably healthy although the base of this is still precariously close to bone. She has been using a wound VAC. 01/23/2016 -- she has completed her course of antibiotics and this week the only new thing is her right great toe nail was avulsed and she has got an open wound over the nailbed. 01/31/16 she has completed her course of antibiotics. Her right great toenail  avulsed last week and she's been using silver alginate for this as well. 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 Did not sound like there was any optimistic discussion regarding reconstruction and the net result would be to continue with the wound VAC application. I will await the official reports. Addendum: -- she was seen at [facility] surgery [facility] by Dr. Tressa Busman. After a thorough review and from what I understand spending 45 minutes with the patient his assessment has been noted by me in detail and the management options were: 1. Continued pressure offloading and wound care versus operative procedures including wound excision 2. Soft tissue and bone sampling 3. If the wound gets larger wound closure would be done using a variety of plastic surgical techniques including but not limited to skin substitute, possible skin graft, local versus regional flaps, negative pressure dressing application. 4. He discussed with her details of flap surgery and the risks associated 5. He made a comment that since the patient was operated on by Dr. Leland Johns of [facility] plastic surgery unit in [place] the patient may continue to follow-up there for further evaluation for surgical flap closure in the future. 03/19/2016 -- the patient continues to be rather depressed and frustrated  with her lack of rapid progress in healing this wound especially because she thought after hyperbaric oxygen therapy the wound would heal extremely fast. She now understands that was not the implied benefit on wound care which was the recommendation for hyperbaric oxygen therapy. I have had a lengthy discussion with the patient and her husband regarding her  options: 1. Continue with collagen and wound VAC for the primary dressing and offloading and all supportive care. 2. See Dr. Iran Planas for possible placement of Acell or Integra in the OR. 3. get a second opinion from a wound care center and surrounding regions/counties 05/07/2016 -- Note from Dr. Celedonio Miyamoto, who noted that the patient has declined flap surgery. She has discussed application of A cell, and try a few applications to see how the wound progresses. She is also recommended that we could apply products here in the wound center, like Oasis. during her preop workup it was found that her hemoglobin A1c was 11% and she has now been diagnosed as having diabetes mellitus and has been put on appropriate treatment by her PCP 05/28/2016 -- tells me her blood sugars have been doing well and she has an appointment to see her PCP in the next couple of weeks to check her hemoglobin A1c. Other than that she continues to do well. 06/25/2016 -- have not seen her back for the last month but she says her health has been about the same and she has an appointment to check the A1c next week 09/10/16 ---- was seen by Dr. Celedonio Miyamoto -- who applied Acell and saw her back in follow-up. She has recommended silver alginate to the wound every other day and cover with foam. If no significant drainage could transition to collagen every other day. She recommended discontinuing wound VAC. There were no plans to repeat application of Acell. The patient expressed that her husband could do the wound care as going to the Wound Ctr., would cost several $100 for each visit. 10/21/2016 -- her insurance company is getting her new mattress and she is pleased about that. Other than that she has been doing dressings with PolyMem Silver and has been doing very well 02/18/2017 -- she has gone through several changes of her mattress and has not been pleased with any of them. The ventricles are still working on trying to  fit her with the appropriate low air-loss mattress. She has a new wound on the gluteal area which is clearly separated from the original wound. 03/25/17-she is here in follow-up evaluation for her left ischialpressure ulcer. She remains unsatisfied with her pressure mattress. She admits to sitting multiple hours a day, in the bed. We have discussed offloading options. The wound does not appear infected. Nutrition does not appear to be a concern. Will follow-up in 4 weeks, if wound continues to be stalled may consider x-ray to evaluate for refractory osteomyelitis. 04/21/17; this is a patient that I don't know all that well. She has a chronic wound which at one point had underlying osteomyelitis in the left ischial tuberosity. This is a stage IV pressure ulcer. Over the last 3 months she has a stage II wound inferiorly to the original wound. The last time she was here her dressing was changed to silver collagen although the patient's husband who changes the dressing said that the collagen stuck to the wound and remove skin from the superficial area therefore he switched back to Anderson 05/13/17; this is a patient we've been following for a left  ischial tuberosity wound which was stage IV at one point had underlying osteomyelitis. Over the last several months she's had a stage II wound just inferior and medial to the related to the wound. According to her husband he is using Endoform layer with collagen although this is not what I had last time. According to her husband they are using Elgie Congo with collagen although I don't quite know how that started. She was hospitalized from 1/20 through 04/30/16. This was related to a UTI. Her blood cultures were negative, urine culture showed multiple species. She did have a CT scan of the abdomen and pelvis which documented chronic osteomyelitis in the area of the wound inflammatory markers were unremarkable. She has had prior knowledge of osteomyelitis. It  looks as though she received IV antibiotics in 2017 and was treated with a course of hyperbaric oxygen. 05/28/17; the wound over the left ischial tuberosity is deeper today and abuts clearly on bone. Nursing intake reported drainage. I therefore culture of the wound. The more superficial area just below this looks about the same. They once again complained that there are mattress cover is not working although apparently [facility] is been noted to see this many times in the report is that the device is functional 06/18/17; the patient had a probing area on the left ischial tuberosity that was draining purulent fluid last time. This also clearly seemed to have open bone. Culture I did showed pansensitive pseudomonas including third generation cephalosporins. I treated this with cefdinir 300 twice a day for 10 days and things seem to have improved. She has a more superficial wound just underneath this area. Amazingly she has a new air mattress through [facility]. I think they gave this to her as a parking give. In any case this now works according to the patient may have something to do with why the areas are looking better. 07/09/17; the patient has a probing area in the left ischial tuberosity that still has some depth. However this is contracted in terms of the wound orifice although the depth is still roughly the same. There is no undermining. She also has the satellite wound which is more superficial. This appears to have a healthy surface we've been using silver collagen 08/06/17; the patient's wound is over the left ischial tuberosity and a satellite lesion just underneath this. The original wound was actually a deep stage 4 wound. We have made good progress in 2 months and there is no longer exposed bone here. 09/03/17; left ischial tuberosity actually appears to be quite healthy. I think we are making progress. No debridement is required. There is no surrounding erythema 10/01/17 I  follow this patient monthly for her left ischial tuberosity wound. There is 2 areas the original area and a satellite area. The satellite area looks a lot better there is no surrounding erythema. Her husband relates that he is having trouble maintaining the dressing. This has to do with the soft tissue around it. He states he puts the collagen in but he cannot make sure that it stays in even with the ABD pads and tape that he is been using 10/29/17; patient arrives with a better looking noon today. Some of the satellite lesions have closed. using Prisma 11/26/17; the patient has a large cone-shaped area with the tip of the Cone deep within her buttock soft tissue. The walls of the Cone are epithelialized however the base is still open. The area at the base of this looks  moist we've been using silver collagen. Will change to silver alginate 12/31/2017; the wound appears to have come in fairly nicely. Using silver alginate. There is no surrounding maceration or infection 01/28/18; there is still an open area here over the left initial tuberosity. Base of this however looks healthy. There is no surrounding infection 02/25/18; the area of its open is over the left ischial tuberosity. The base of this is where the wound is. This is a large inverted cone-shaped area with the wound at the tip. Dimensions of the wound at the tip are improved. There is a area of denuded skin about halfway towards the tip which her husband thinks may have happened today when he was bathing her. 04/20/17; the area is still open over the left initial tuberosity. 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 This is now completely closed over although it is closed over with a divot and skin over bone at the base of this. Continued aggressive  offloading will be necessary. 12/30/2018 READMISSION This is a 69 year old woman with chronic paraplegia. I picked her up for her care from Dr. Con Memos in this clinic after he departed. She had a stage IV pressure wound over the left ischial tuberosity. She was treated twice for her underlying osteomyelitis and this I believe firstly in 2016 and again in 2017. There were some plans at some point for flap closure of this however she was discovered to have uncontrolled diabetes and I do not think this was ever accomplished. She ultimately healed over in this clinic and was discharged in May. She has a large cone-shaped indentation with the tip of this going towards the left ischial tuberosity. It is not an easy area to examine but at that time I thought all of this was epithelialized. Apparently there was a reopening here shortly after she left the clinic last time. She was admitted to hospital at the end of June for Klebsiella bacteremia felt to be secondary to UTI. A CT scan of the pelvis is listed below and there was initially some concern that she had underlying osteomyelitis although I believe she was seen by infectious disease and that was felt to be not the case: I do not see any new cultures or inflammatory markers IMPRESSION: 1. No CT evidence for acute intra-abdominal or pelvic abnormality. Large volume of stool throughout the colon. 2. Enlarged fatty liver with fat sparing near the gallbladder fossa 3. Cortical scarring right kidney. Bilateral intrarenal stones without hydronephrosis. Thick-walled urinary bladder decompressed by suprapubic catheter 4. Deep left decubitus ulcer with underlying left ischial changes suggesting osteomyelitis. Her husband has been using silver collagen in the wound. She has not been systemically unwell no fever chills eating and drinking well. They rigorously offload this wound only getting up in the wheelchair when she is going to appointments the rest of the  time she is in bed. 10/8; wound measures larger and she now has exposed bone. We have been using silver alginate 11/12 still using silver alginate. The patient saw Dr. Megan Salon of infectious disease. She was started on Augmentin 500 mg twice daily. She is due to follow-up with Dr. Megan Salon I believe next week. Lab work Dr. Megan Salon requested showed a sedimentation rate of 28 and CRP of 20 although her CRP 1 year ago  was 18.8. Sedimentation rate 1 year ago was 11 basic metabolic panel showed a creatinine of 1.12 12/3; the patient followed up with Dr. Megan Salon yesterday. She is still on Augmentin twice daily. This was directed by Dr. Megan Salon. The patient's inflammatory markers have improved which is gratifying. Her C-reactive protein was repeated yesterday and follow-up booked with infectious disease in January. In addition I have been getting secure text messages I think from palliative care through [facility] [employer]. I think they were hoping to provide services to the patient in her home. They could not get a hold of the primary physician and so they reached out to me on 2 separate occasions. 12/17; patient last saw Dr. Megan Salon on 12/2. She is finishing up with Augmentin. Her C-reactive protein was 20 on 10/21, 10.1 on 11/19 and 17 on 12/2. The wound itself still has depth and undermining. We are using Santyl with the backing wet-to-dry 04/27/2019. The wound is gradually clearing up in terms of the surface although it is not filled in that much. Still abuts right against bone 2/4; patient with a deep pressure ulcer over the left ischial tuberosity. I thought she was going to follow-up with infectious disease to follow her inflammatory markers although the patient states that they stated that they did not need to see her unless we felt it was necessary. I will need to check their notes. In any case we ordered moistened silver collagen back with wet-to-dry to fill in the depth of  the wound although apparently [facility] sent silver alginate which they have been using since they were here the last time. Is obviously not what we ordered. 2/25. Not much change in this wound it is over the left ischial tuberosity recurrent wound. We have been using silver collagen with backing wet-to-dry. I think the wound is about the same. There is still some tunneling from about 10-12 o'clock over the ischial tuberosity itself 3/11; pressure ulcer over the left ischial tuberosity. Since she was last here the wound VAC was started and apparently going quite well. We are able to get the home health company that accepts [place] healthcare which is in itself sometimes problematic. There is been improvements in the wound the tunneling seems to be better and is contracted nicely 4/8; 1 month follow-up. Since she was last here we have been using silver collagen under a wound VAC. Some minor contraction I think in wound volume. She is cared for diligently by her husband including pressure relief, incontinence management, nutritional support etc. 6/1; this is almost a 26-monthfollow-up. She is been using silver collagen under wound VAC. Circular area over the left ischial tuberosity. She has been using silver collagen under wound VAC 7/8; 1 month follow-up. Silver collagen under the VAC not really a lot of progress. Tissue at the base of the wound which is right against bone and the tissue next that this does not look completely viable. She is not currently on any antibiotics, she had underlying osteomyelitis I need to look this over 8/16; we are using silver collagen under wound VAC to the left ischial tuberosity wound. Comes in today with absolutely no change in surface area or depth. There is no exposed bone. I did look over her infectious disease notes as I said I would do last time. She last saw Dr. CMegan Salonin December 2020. She completed 6 weeks of Augmentin. This was in response to a bone culture I  did showing methicillin susceptible staph aureus  and Enterococcus. She was supposed to come back to see Dr. Orvan [name] at some point although they say that that appointment was canceled unless I chose to recommend return. I think there was supposed to be follow-up with inflammatory markers but I cannot see that that was ever done. She has not been on antibiotics since 9/21; monthly follow-up. We received a call from home health nurse last evening to report green drainage coming out of the wound. Lab work I ordered last time showed a white count of 5.2 a sedimentation rate of 45 and a C-reactive protein of 25 however neither one of the 2 values are substantially different from her previous values in October 2020 or December 2020. Both are slightly higher but only marginally. Otherwise no new complaints from the patient or her husband 10/19; 1 month follow-up. PCR culture I did last time showed medium quantities of Pseudomonas lower quantities Klebsiella and Enterococcus faecalis group B strep and Peptostreptococcus. I gave her Augmentin for 2 weeks. I am not really sure of my choice of this I would not cover Pseudomonas. She is still having green drainage. Wound itself looks satisfactory there is not a lot of depth wound bed looks healthy 11/16; patient has completed the antibiotics still using gentamicin and silver alginate on the wound. There is improvement in the surface area Electronic Signature(s) Signed: 02/21/2020 4:54:15 PM By: Baltazar Najjar MD Entered By: Baltazar Najjar on 02/21/2020 12:43:34 -------------------------------------------------------------------------------- Physical Exam Details Patient Name: Date of Service: [name]. 02/21/2020 10:45 A M Medical Record Number: [id number] Patient Account Number: [id number] Date of Birth/Sex: Treating RN: [birth year] (69 y.o. [name] Primary Care Provider: Salli Real Other Clinician: Referring Provider: Treating  Provider/Extender: Alvester Morin in Treatment: 72 Constitutional Sitting or standing Blood Pressure is within target range for patient.. Pulse regular and within target range for patient.Marland Kitchen Respirations regular, non-labored and within target range.. Temperature is normal and within the target range for the patient.Marland Kitchen Appears in no distress. Psychiatric appears at normal baseline. Notes Wound exam; left ischial tuberosity. This may have come in slightly. Optimistically the entire wound bed looks healthy. This is deep however relative to where it is it abuts right up against bone I see no obvious infection. There is no necrotic tissue no surrounding erythema and no purulent drainage. Electronic Signature(s) Signed: 02/21/2020 4:54:15 PM By: Baltazar Najjar MD Entered By: Baltazar Najjar on 02/21/2020 12:46:08 -------------------------------------------------------------------------------- Physician Orders Details Patient Name: Date of Service: [name]. 02/21/2020 10:45 A M Medical Record Number: [id number] Patient Account Number: [id number] Date of Birth/Sex: Treating RN: September 25, 1950 (69 y.o. [name] Primary Care Provider: Salli Real Other Clinician: Referring Provider: Treating Provider/Extender: Alvester Morin in Treatment: 30 Verbal / Phone Orders: No Diagnosis Coding ICD-10 Coding Code Description [phone] Pressure ulcer of left buttock, stage 4 G82.20 Paraplegia, unspecified E11.622 Type 2 diabetes mellitus with other skin ulcer M86.68 Other chronic osteomyelitis, other site Follow-up Appointments Return appointment in 1 month. Dressing Change Frequency Wound #12 Left Ischial Tuberosity Change Dressing every other day. Skin Barriers/Peri-Wound Care Skin Prep - to periwound Wound Cleansing Wound #12 Left Ischial Tuberosity Clean wound with Normal Saline. - or wound cleanser Primary Wound Dressing Wound #12 Left Ischial  Tuberosity lginate with Silver - apply gentamycin ointment under the alginate Ag. Calcium A Secondary Dressing Wound #12 Left Ischial Tuberosity Foam Border - or ABD pad and tape Off-Loading Low air-loss mattress (Group 2) Gel wheelchair  cushion Turn and reposition every 2 hours Additional Orders / Instructions Other: - pick up gentamycin ointment from pharmacy. [facility] for wound care. - [facility] Electronic Signature(s) Signed: 02/21/2020 4:54:15 PM By: Linton Ham MD Signed: 02/21/2020 5:00:30 PM By: Carlene Coria RN Entered By: Carlene Coria on 02/21/2020 10:38:17 -------------------------------------------------------------------------------- Problem List Details Patient Name: Date of Service: [name], [name]. 02/21/2020 10:45 A M Medical Record Number: [id number] Patient Account Number: [id number] Date of Birth/Sex: Treating RN: Dec 29, 1950 (69 y.o. [name] Primary Care Provider: [name] Other Clinician: Referring Provider: Treating Provider/Extender: Tommy Rainwater in Treatment: 52 Active Problems ICD-10 Encounter Code Description Active Date MDM Diagnosis 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, stage 4 12/30/2018 No Yes G82.20 Paraplegia, unspecified 12/30/2018 No Yes E11.622 Type 2 diabetes mellitus with other skin ulcer 12/30/2018 No Yes M86.68 Other chronic osteomyelitis, other site 02/17/2019 No Yes Inactive Problems Resolved Problems Electronic Signature(s) Signed: 02/21/2020 4:54:15 PM By: Linton Ham MD Entered By: Linton Ham on 02/21/2020 12:42:36 -------------------------------------------------------------------------------- Progress Note Details Patient Name: Date of Service: [name]. 02/21/2020 10:45 A M Medical Record Number: [id number] Patient Account Number: [id number] Date of Birth/Sex: Treating RN: [birth year] (69 y.o. [name] Primary Care  Provider: [name] Other Clinician: Referring Provider: Treating Provider/Extender: Tommy Rainwater in Treatment: 59 Subjective History of Present Illness (HPI) The following HPI elements were documented for the patient's wound: Location: open ulceration of the left gluteal area, left heel and right ankle for about 5 months. Quality: Patient reports No Pain. Severity: Patient states wound(s) are getting worse. Duration: Patient has had the wound for > 5 months prior to seeking treatment at the wound center Context: The wound occurred when the patient has been paraplegic for about 3 years. Modifying Factors: Wound improving due to current treatment. Associated Signs and Symptoms: Patient reports having foul odor. this 69 year old patient who is known to have hypertension, hypothyroidism, breast cancer, chronic pain syndrome, paraplegia was noted to have a left gluteal decubitus ulcer and was brought into the hospital. During the course of her hospitalization she was debrided in the operating room by Dr. Leland Johns and had all the wounds sharply debrided. The debridement was done for the left ischial wound, the left heel wound and the right ankle wound. Bone cultures were taken at that time but were negative but clinically she was treated for osteomyelitis because of the probing down to bone and open exposed bone. Home health has been giving her antibioticss which include vancomycin and Zosyn. The patient was a smoker until about 3 weeks ago and used to smoke about 10 cigarettes a day for a long while. 12/13/2014 - details of her operative note from 11/03/2014 were reviewed -- PROCEDURE: 1. Excisional debridement skin, subcutaneous, muscle left ischium 35 cm2 2. Excisional debridement skin, subcutaneous tissue left heel 27 cm2 3. Excisional debridement right ankle skin, subcutaneous, bone 30 cm2 01/24/2015 -- she has some issues with her wheelchair cushion but other than that is  doing very well and has received Podus boots for her feet. 02/14/2015 -- she was using her old offloading boots and this seemed to have caused her a new pressure ulcer on the left posterior heel near the superior part just below the Achilles tendon. 03/07/2015 -- she has a new ulceration just to the left of the midline on her sacral region more on the left buttock and this has been there for  about a week. 08/22/2015 -- was recently admitted to hospital between May 5 and 08/13/2015, with sepsis and leukocytosis due to a UTI. she was treated for a sepsis complicating Escherichia coli UTI and kidney stones. She also had metabolic and careful up at the secondary to pyelonephritis. He received broad-spectrum antibiotics initially and then received Macrobid as per urology. She was sent home on nitrofurantoin. during her admission she had a CT scan which showed exposed left ischial tuberosity without evidence of osteolysis. 09/12/2015-- the patient is having some issues with her air mattress and would like to get a opinion from medical modalities. 10/10/2015 -- the issue with her air mattress has not yet been sorted out and the new problem seems to be a lot of odor from the wound VAC. 11/27/2015 -- the patient was admitted to the hospital between July 23 and 10/31/2015. Her problems were sepsis, osteomyelitis of the pelvic bone and acute pyelonephritis. CT of the abdomen and pelvis was consistent with a left-sided pyelonephritis with hydronephrosis and also just showed new sclerosis of the posterior portion of the left anterior pubic ramus suggestive of periosteal reaction consistent with osteomyelitis. She was treated for the osteomyelitis with infectious disease consult recommending 6 weeks of IV antibiotics including vancomycin and Rocephin and the antibiotics were to go on until 12/10/2015. He was seen by Dr. Iran Planas plastic surgery and Dr. Linus Salmons of infectious disease. She had a suprapubic catheter  placed during the admission. CT scan done on 10/28/2015 showed specifically -- New sclerosis of the posterior portion of the left inferior pubic ramus with aggressive periosteal reaction, consistent with osteomyelitis, with adjacent soft tissue gas compatible with previously described decubitus ulcer. 12/12/2015 -- she was recently seen by Dr. Linus Salmons, who noted good improvement and CRP and ESR compared to before and he has stopped her antibiotic as per plans to finish on September 4. The patient was encouraged to continue with wound care and consider hyperbaric oxygen therapy. Today she tells me that she has consented to undergo hyperbaric oxygen therapy and we can start the paperwork. 01/02/2016 -- her PCP had gained about 3 years but she still persists in having problems during hyperbaric oxygen therapy with some discomfort in the ears. 01/09/16; pressure area with underlying osteomyelitis in the left buttock. Wound bed itself has some slight amount of grayish surface slough however I do not think any debridement was necessary. There is no exposed bone soft tissue appears stable. She is using a wound VAC 01/16/16; back for weekly wound review in conjunction with HBO. She has a deep wound over the left initial tuberosity previously treated with 6 weeks of IV antibiotics for osteomyelitis. Wound bed looks reasonably healthy although the base of this is still precariously close to bone. She has been using a wound VAC. 01/23/2016 -- she has completed her course of antibiotics and this week the only new thing is her right great toe nail was avulsed and she has got an open wound over the nailbed. 01/31/16 she has completed her course of antibiotics. Her right great toenail avulsed last week and she's been using silver alginate for this as well. Still using a wound VAC to the substantial stage IV wound over the left ischial tuberosity 03/05/2016 -- the patient has had a opinion from the plastic surgery  group at [facility] and details of this are not available yet but the patient's verbal report has been heard by me. Did not sound like there was any optimistic discussion regarding reconstruction  and the net result would be to continue with the wound VAC application. I will await the official reports. Addendum: -- she was seen at [facility] surgery [facility] by Dr. Tressa Busman. After a thorough review and from what I understand spending 45 minutes with the patient his assessment has been noted by me in detail and the management options were: 1. Continued pressure offloading and wound care versus operative procedures including wound excision 2. Soft tissue and bone sampling 3. If the wound gets larger wound closure would be done using a variety of plastic surgical techniques including but not limited to skin substitute, possible skin graft, local versus regional flaps, negative pressure dressing application. 4. He discussed with her details of flap surgery and the risks associated 5. He made a comment that since the patient was operated on by Dr. Leland Johns of [facility] plastic surgery unit in [employer] the patient may continue to follow-up there for further evaluation for surgical flap closure in the future. 03/19/2016 -- the patient continues to be rather depressed and frustrated with her lack of rapid progress in healing this wound especially because she thought after hyperbaric oxygen therapy the wound would heal extremely fast. She now understands that was not the implied benefit on wound care which was the recommendation for hyperbaric oxygen therapy. I have had a lengthy discussion with the patient and her husband regarding her options: 1. Continue with collagen and wound VAC for the primary dressing and offloading and all supportive care. 2. See Dr. Iran Planas for possible placement of Acell or Integra in the OR. 3. get a second opinion from a wound care  center and surrounding regions/counties 05/07/2016 -- Note from Dr. Celedonio Miyamoto, who noted that the patient has declined flap surgery. She has discussed application of A cell, and try a few applications to see how the wound progresses. She is also recommended that we could apply products here in the wound center, like Oasis. during her preop workup it was found that her hemoglobin A1c was 11% and she has now been diagnosed as having diabetes mellitus and has been put on appropriate treatment by her PCP 05/28/2016 -- tells me her blood sugars have been doing well and she has an appointment to see her PCP in the next couple of weeks to check her hemoglobin A1c. Other than that she continues to do well. 06/25/2016 -- have not seen her back for the last month but she says her health has been about the same and she has an appointment to check the A1c next week 09/10/16 ---- was seen by Dr. Celedonio Miyamoto -- who applied Acell and saw her back in follow-up. She has recommended silver alginate to the wound every other day and cover with foam. If no significant drainage could transition to collagen every other day. She recommended discontinuing wound VAC. There were no plans to repeat application of Acell. The patient expressed that her husband could do the wound care as going to the Wound Ctr., would cost several $100 for each visit. 10/21/2016 -- her insurance company is getting her new mattress and she is pleased about that. Other than that she has been doing dressings with PolyMem Silver and has been doing very well 02/18/2017 -- she has gone through several changes of her mattress and has not been pleased with any of them. The ventricles are still working on trying to fit her with the appropriate low air-loss mattress. She has a new wound on  the gluteal area which is clearly separated from the original wound. 03/25/17-she is here in follow-up evaluation for her left ischialpressure ulcer. She  remains unsatisfied with her pressure mattress. She admits to sitting multiple hours a day, in the bed. We have discussed offloading options. The wound does not appear infected. Nutrition does not appear to be a concern. Will follow-up in 4 weeks, if wound continues to be stalled may consider x-ray to evaluate for refractory osteomyelitis. 04/21/17; this is a patient that I don't know all that well. She has a chronic wound which at one point had underlying osteomyelitis in the left ischial tuberosity. This is a stage IV pressure ulcer. Over the last 3 months she has a stage II wound inferiorly to the original wound. The last time she was here her dressing was changed to silver collagen although the patient's husband who changes the dressing said that the collagen stuck to the wound and remove skin from the superficial area therefore he switched back to [place] 05/13/17; this is a patient we've been following for a left ischial tuberosity wound which was stage IV at one point had underlying osteomyelitis. Over the last several months she's had a stage II wound just inferior and medial to the related to the wound. According to her husband he is using Endoform layer with collagen although this is not what I had last time. According to her husband they are using Elgie [place] with collagen although I don't quite know how that started. She was hospitalized from 1/20 through 04/30/16. This was related to a UTI. Her blood cultures were negative, urine culture showed multiple species. She did have a CT scan of the abdomen and pelvis which documented chronic osteomyelitis in the area of the wound inflammatory markers were unremarkable. She has had prior knowledge of osteomyelitis. It looks as though she received IV antibiotics in 2017 and was treated with a course of hyperbaric oxygen. 05/28/17; the wound over the left ischial tuberosity is deeper today and abuts clearly on bone. Nursing intake reported  drainage. I therefore culture of the wound. The more superficial area just below this looks about the same. They once again complained that there are mattress cover is not working although apparently [facility] is been noted to see this many times in the report is that the device is functional 06/18/17; the patient had a probing area on the left ischial tuberosity that was draining purulent fluid last time. This also clearly seemed to have open bone. Culture I did showed pansensitive pseudomonas including third generation cephalosporins. I treated this with cefdinir 300 twice a day for 10 days and things seem to have improved. She has a more superficial wound just underneath this area. Amazingly she has a new air mattress through [facility]. I think they gave this to her as a parking give. In any case this now works according to the patient may have something to do with why the areas are looking better. 07/09/17; the patient has a probing area in the left ischial tuberosity that still has some depth. However this is contracted in terms of the wound orifice although the depth is still roughly the same. There is no undermining. ooShe also has the satellite wound which is more superficial. This appears to have a healthy surface we've been using silver collagen 08/06/17; the patient's wound is over the left ischial tuberosity and a satellite lesion just underneath this. The original wound was actually a deep stage 4 wound.  We have made good progress in 2 months and there is no longer exposed bone here. 09/03/17; left ischial tuberosity actually appears to be quite healthy. I think we are making progress. No debridement is required. There is no surrounding erythema 10/01/17 I follow this patient monthly for her left ischial tuberosity wound. There is 2 areas the original area and a satellite area. The satellite area looks a lot better there is no surrounding erythema. Her husband relates that he  is having trouble maintaining the dressing. This has to do with the soft tissue around it. He states he puts the collagen in but he cannot make sure that it stays in even with the ABD pads and tape that he is been using 10/29/17; patient arrives with a better looking noon today. Some of the satellite lesions have closed. using Prisma 11/26/17; the patient has a large cone-shaped area with the tip of the Cone deep within her buttock soft tissue. The walls of the Cone are epithelialized however the base is still open. The area at the base of this looks moist we've been using silver collagen. Will change to silver alginate 12/31/2017; the wound appears to have come in fairly nicely. Using silver alginate. There is no surrounding maceration or infection 01/28/18; there is still an open area here over the left initial tuberosity. Base of this however looks healthy. There is no surrounding infection 02/25/18; the area of its open is over the left ischial tuberosity. The base of this is where the wound is. This is a large inverted cone-shaped area with the wound at the tip. Dimensions of the wound at the tip are improved. There is a area of denuded skin about halfway towards the tip which her husband thinks may have happened today when he was bathing her. 04/20/17; the area is still open over the left initial tuberosity. 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 This is now completely closed over although it is closed over with a divot and skin over bone at the base of this. Continued aggressive offloading will be necessary. 12/30/2018 READMISSION This is a 69 year old woman with chronic paraplegia. I picked her up for her care from Dr. Con Memos in this clinic after he departed. She had a stage IV pressure wound over the  left ischial tuberosity. She was treated twice for her underlying osteomyelitis and this I believe firstly in 2016 and again in 2017. There were some plans at some point for flap closure of this however she was discovered to have uncontrolled diabetes and I do not think this was ever accomplished. She ultimately healed over in this clinic and was discharged in May. She has a large cone-shaped indentation with the tip of this going towards the left ischial tuberosity. It is not an easy area to examine but at that time I thought all of this was epithelialized. Apparently there was a reopening here shortly after she left the clinic last time. She was admitted to hospital at the end of June for Klebsiella bacteremia felt to be secondary to UTI. A CT scan of the pelvis is listed below and there was initially some concern that she had underlying osteomyelitis although I believe she was seen by infectious disease and that was felt to be not the case: I do not see  any new cultures or inflammatory markers IMPRESSION: 1. No CT evidence for acute intra-abdominal or pelvic abnormality. Large volume of stool throughout the colon. 2. Enlarged fatty liver with fat sparing near the gallbladder fossa 3. Cortical scarring right kidney. Bilateral intrarenal stones without hydronephrosis. Thick-walled urinary bladder decompressed by suprapubic catheter 4. Deep left decubitus ulcer with underlying left ischial changes suggesting osteomyelitis. Her husband has been using silver collagen in the wound. She has not been systemically unwell no fever chills eating and drinking well. They rigorously offload this wound only getting up in the wheelchair when she is going to appointments the rest of the time she is in bed. 10/8; wound measures larger and she now has exposed bone. We have been using silver alginate 11/12 still using silver alginate. The patient saw Dr. Megan Salon of infectious disease. She was started on Augmentin  500 mg twice daily. She is due to follow-up with Dr. Megan Salon I believe next week. Lab work Dr. Megan Salon requested showed a sedimentation rate of 28 and CRP of 20 although her CRP 1 year ago was 18.8. Sedimentation rate 1 year ago was 11 basic metabolic panel showed a creatinine of 1.12 12/3; the patient followed up with Dr. Megan Salon yesterday. She is still on Augmentin twice daily. This was directed by Dr. Megan Salon. The patient's inflammatory markers have improved which is gratifying. Her C-reactive protein was repeated yesterday and follow-up booked with infectious disease in January. In addition I have been getting secure text messages I think from palliative care through [facility] [employer]. I think they were hoping to provide services to the patient in her home. They could not get a hold of the primary physician and so they reached out to me on 2 separate occasions. 12/17; patient last saw Dr. Megan Salon on 12/2. She is finishing up with Augmentin. Her C-reactive protein was 20 on 10/21, 10.1 on 11/19 and 17 on 12/2. The wound itself still has depth and undermining. We are using Santyl with the backing wet-to-dry 04/27/2019. The wound is gradually clearing up in terms of the surface although it is not filled in that much. Still abuts right against bone 2/4; patient with a deep pressure ulcer over the left ischial tuberosity. I thought she was going to follow-up with infectious disease to follow her inflammatory markers although the patient states that they stated that they did not need to see her unless we felt it was necessary. I will need to check their notes. In any case we ordered moistened silver collagen back with wet-to-dry to fill in the depth of the wound although apparently [facility] sent silver alginate which they have been using since they were here the last time. Is obviously not what we ordered. 2/25. Not much change in this wound it is over the left ischial tuberosity  recurrent wound. We have been using silver collagen with backing wet-to-dry. I think the wound is about the same. There is still some tunneling from about 10-12 o'clock over the ischial tuberosity itself 3/11; pressure ulcer over the left ischial tuberosity. Since she was last here the wound VAC was started and apparently going quite well. We are able to get the home health company that accepts [place] healthcare which is in itself sometimes problematic. There is been improvements in the wound the tunneling seems to be better and is contracted nicely 4/8; 1 month follow-up. Since she was last here we have been using silver collagen under a wound VAC. Some minor contraction I  think in wound volume. She is cared for diligently by her husband including pressure relief, incontinence management, nutritional support etc. 6/1; this is almost a 37-month follow-up. She is been using silver collagen under wound VAC. Circular area over the left ischial tuberosity. She has been using silver collagen under wound VAC 7/8; 1 month follow-up. Silver collagen under the VAC not really a lot of progress. Tissue at the base of the wound which is right against bone and the tissue next that this does not look completely viable. She is not currently on any antibiotics, she had underlying osteomyelitis I need to look this over 8/16; we are using silver collagen under wound VAC to the left ischial tuberosity wound. Comes in today with absolutely no change in surface area or depth. There is no exposed bone. I did look over her infectious disease notes as I said I would do last time. She last saw Dr. Orvan [name] in December 2020. She completed 6 weeks of Augmentin. This was in response to a bone culture I did showing methicillin susceptible staph aureus and Enterococcus. She was supposed to come back to see Dr. Orvan [name] at some point although they say that that appointment was canceled unless I chose to recommend return. I  think there was supposed to be follow-up with inflammatory markers but I cannot see that that was ever done. She has not been on antibiotics since 9/21; monthly follow-up. We received a call from home health nurse last evening to report green drainage coming out of the wound. Lab work I ordered last time showed a white count of 5.2 a sedimentation rate of 45 and a C-reactive protein of 25 however neither one of the 2 values are substantially different from her previous values in October 2020 or December 2020. Both are slightly higher but only marginally. Otherwise no new complaints from the patient or her husband 10/19; 1 month follow-up. PCR culture I did last time showed medium quantities of Pseudomonas lower quantities Klebsiella and Enterococcus faecalis group B strep and Peptostreptococcus. I gave her Augmentin for 2 weeks. I am not really sure of my choice of this I would not cover Pseudomonas. She is still having green drainage. Wound itself looks satisfactory there is not a lot of depth wound bed looks healthy 11/16; patient has completed the antibiotics still using gentamicin and silver alginate on the wound. There is improvement in the surface area Objective Constitutional Sitting or standing Blood Pressure is within target range for patient.. Pulse regular and within target range for patient.Marland Kitchen Respirations regular, non-labored and within target range.. Temperature is normal and within the target range for the patient.Marland Kitchen Appears in no distress. Vitals Time Taken: 11:15 AM, Height: 63 in, Weight: 185 lbs, BMI: 32.8, Temperature: 97.4 F, Pulse: 84 bpm, Respiratory Rate: 18 breaths/min, Blood Pressure: 101/67 mmHg. Psychiatric appears at normal baseline. General Notes: Wound exam; left ischial tuberosity. This may have come in slightly. Optimistically the entire wound bed looks healthy. This is deep however relative to where it is it abuts right up against bone I see no obvious infection.  There is no necrotic tissue no surrounding erythema and no purulent drainage. Integumentary (Hair, Skin) Wound #12 status is Open. Original cause of wound was Pressure Injury. The wound is located on the Left Ischial Tuberosity. The wound measures 2.7cm length x 1cm width x 1cm depth; 2.121cm^2 area and 2.121cm^3 volume. There is Fat Layer (Subcutaneous Tissue) exposed. There is no tunneling or undermining noted. There is a medium  amount of serosanguineous drainage noted. The wound margin is well defined and not attached to the wound base. There is large (67-100%) red, pink granulation within the wound bed. There is no necrotic tissue within the wound bed. Assessment Active Problems ICD-10 Pressure ulcer of left buttock, stage 4 Paraplegia, unspecified Type 2 diabetes mellitus with other skin ulcer Other chronic osteomyelitis, other site Plan Follow-up Appointments: Return appointment in 1 month. Dressing Change Frequency: Wound #12 Left Ischial Tuberosity: Change Dressing every other day. Skin Barriers/Peri-Wound Care: Skin Prep - to periwound Wound Cleansing: Wound #12 Left Ischial Tuberosity: Clean wound with Normal Saline. - or wound cleanser Primary Wound Dressing: Wound #12 Left Ischial Tuberosity: Calcium Alginate with Silver - apply gentamycin ointment under the alginate Ag. Secondary Dressing: Wound #12 Left Ischial Tuberosity: Foam Border - or ABD pad and tape Off-Loading: Low air-loss mattress (Group 2) Gel wheelchair cushion Turn and reposition every 2 hours Additional Orders / Instructions: Other: - pick up gentamycin ointment from pharmacy. Home Health: [facility] for [facility] care. - [facility]. I am continue with the gentamicin and silver alginate. 2. Had some thoughts about how to promote granulation here. If we were to move to an advanced treatment product she would need to come in every week. I might consider going back to collagen  with backing wet-to-dry next time if the dimensions are the same especially the depth Electronic Signature(s) Signed: 02/21/2020 4:54:15 PM By: Linton Ham MD Entered By: Linton Ham on 02/21/2020 12:46:59 -------------------------------------------------------------------------------- SuperBill Details Patient Name: Date of Service: [name]. 02/21/2020 Medical Record Number: [id number] Patient Account Number: [id number] Date of Birth/Sex: Treating RN: 05-09-1950 (69 y.o. [name] Primary Care Provider: [name] Other Clinician: Referring Provider: Treating Provider/Extender: Tommy Rainwater in Treatment: 59 Diagnosis Coding ICD-10 Codes Code Description 412 [id number] Pressure ulcer of left buttock, stage 4 G82.20 Paraplegia, unspecified E11.622 Type 2 diabetes mellitus with other skin ulcer M86.68 Other chronic osteomyelitis, other site Facility Procedures The patient participates with Medicare or their insurance follows the Medicare Facility Guidelines: CPT4 Code Description Modifier Quantity [id number] [place] VISIT-LEV 3 EST PT 1 Physician Procedures : CPT4 Code Description Modifier [id number] - WC PHYS LEVEL 3 - EST PT ICD-10 Diagnosis Description 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, stage 4 G82.20 Paraplegia, unspecified Quantity: 1 Electronic Signature(s) Signed: 02/21/2020 4:54:15 PM By: Linton Ham MD Entered By: Linton Ham on 02/21/2020 12:47:18

## 2020-02-21 NOTE — Progress Notes (Signed)
[name], [name] ([id number]) Visit Report for 02/21/2020 Arrival Information Details Patient Name: Date of Service: [name], [name] 02/21/2020 10:45 A M Medical Record Number: [id number] Patient Account Number: [id number] Date of Birth/Sex: Treating RN: 04/05/1951 (69 y.o. [name], [name] Primary Care [name]: [name] Other Clinician: Referring [name]: Treating Jaquelyn Sakamoto/Extender: [name] in Treatment: 61 Visit Information History Since Last Visit Added or deleted any medications: No Patient Arrived: Wheel Chair Any new allergies or adverse reactions: No Arrival Time: 11:13 Had a fall or experienced change in No Accompanied By: husband activities of daily living that may affect Transfer Assistance: None risk of falls: Patient Identification Verified: Yes Signs or symptoms of abuse/neglect since last visito No Secondary Verification Process Completed: Yes Hospitalized since last visit: No Patient Requires Transmission-Based Precautions: No Implantable device outside of the clinic excluding No Patient Has Alerts: No cellular tissue based products placed in the center since last visit: Has Dressing in Place as Prescribed: Yes Pain Present Now: No Electronic Signature(s) Signed: 02/21/2020 12:08:35 PM By: Deon Pilling Entered By: Deon Pilling on 02/21/2020 11:17:33 -------------------------------------------------------------------------------- Clinic Level of Care Assessment Details Patient Name: Date of Service: [name] 02/21/2020 10:45 A M Medical Record Number: [id number] Patient Account Number: [id number] Date of Birth/Sex: Treating RN: [birth year] (69 y.o. [name] Primary Care [name]: [name] Other Clinician: Referring [name]: Treating [name]/Extender: [name] in Treatment: 59 Clinic Level of Care Assessment Items TOOL 4 Quantity Score X- 1 0 Use when only an EandM is performed  on FOLLOW-UP visit ASSESSMENTS - Nursing Assessment / Reassessment X- 1 10 Reassessment of Co-morbidities (includes updates in patient status) X- 1 5 Reassessment of Adherence to Treatment Plan ASSESSMENTS - Wound and Skin A ssessment / Reassessment X - Simple Wound Assessment / Reassessment - one wound 1 5 []  - 0 Complex Wound Assessment / Reassessment - multiple wounds []  - 0 Dermatologic / Skin Assessment (not related to wound area) ASSESSMENTS - Focused Assessment []  - 0 Circumferential Edema Measurements - multi extremities []  - 0 Nutritional Assessment / Counseling / Intervention []  - 0 Lower Extremity Assessment (monofilament, tuning fork, pulses) []  - 0 Peripheral Arterial Disease Assessment (using hand held doppler) ASSESSMENTS - Ostomy and/or Continence Assessment and Care []  - 0 Incontinence Assessment and Management []  - 0 Ostomy Care Assessment and Management (repouching, etc.) PROCESS - Coordination of Care X - Simple Patient / Family Education for ongoing care 1 15 []  - 0 Complex (extensive) Patient / Family Education for ongoing care X- 1 10 Staff obtains Programmer, systems, Records, T Results / Process Orders est []  - 0 Staff telephones HHA, Nursing Homes / Clarify orders / etc []  - 0 Routine Transfer to another Facility (non-emergent condition) []  - 0 Routine Hospital Admission (non-emergent condition) []  - 0 New Admissions / Biomedical engineer / Ordering NPWT Apligraf, etc. , []  - 0 Emergency Hospital Admission (emergent condition) X- 1 10 Simple Discharge Coordination []  - 0 Complex (extensive) Discharge Coordination PROCESS - Special Needs []  - 0 Pediatric / Minor Patient Management []  - 0 Isolation Patient Management []  - 0 Hearing / Language / Visual special needs []  - 0 Assessment of Community assistance (transportation, D/C planning, etc.) []  - 0 Additional assistance / Altered mentation []  - 0 Support Surface(s) Assessment (bed,  cushion, seat, etc.) INTERVENTIONS - Wound Cleansing / Measurement X - Simple Wound Cleansing - one wound 1 5 []  - 0 Complex Wound Cleansing -  multiple wounds X- 1 5 Wound Imaging (photographs - any number of wounds) []  - 0 Wound Tracing (instead of photographs) X- 1 5 Simple Wound Measurement - one wound []  - 0 Complex Wound Measurement - multiple wounds INTERVENTIONS - Wound Dressings []  - 0 Small Wound Dressing one or multiple wounds X- 1 15 Medium Wound Dressing one or multiple wounds []  - 0 Large Wound Dressing one or multiple wounds X- 1 5 Application of Medications - topical []  - 0 Application of Medications - injection INTERVENTIONS - Miscellaneous []  - 0 External ear exam []  - 0 Specimen Collection (cultures, biopsies, blood, body fluids, etc.) []  - 0 Specimen(s) / Culture(s) sent or taken to Lab for analysis []  - 0 Patient Transfer (multiple staff / Civil Service fast streamer / Similar devices) []  - 0 Simple Staple / Suture removal (25 or less) []  - 0 Complex Staple / Suture removal (26 or more) []  - 0 Hypo / Hyperglycemic Management (close monitor of Blood Glucose) []  - 0 Ankle / Brachial Index (ABI) - do not check if billed separately X- 1 5 Vital Signs Has the patient been seen at the hospital within the last three years: Yes Total Score: 95 Level Of Care: New/Established - Level 3 Electronic Signature(s) Signed: 02/21/2020 5:00:30 PM By: Carlene Coria RN Entered By: Carlene Coria on 02/21/2020 12:03:07 -------------------------------------------------------------------------------- Lower Extremity Assessment Details Patient Name: Date of Service: [name], [name] 02/21/2020 10:45 A M Medical Record Number: [id number] Patient Account Number: [id number] Date of Birth/Sex: Treating RN: [birth year] (69 y.o. [name] Primary Care Jaymir Struble: [name] Other Clinician: Referring [name]: Treating [name]/Extender: [name] in Treatment:  7 Electronic Signature(s) Signed: 02/21/2020 12:08:35 PM By: Deon Pilling Entered By: Deon Pilling on 02/21/2020 11:18:54 -------------------------------------------------------------------------------- Multi Wound Chart Details Patient Name: Date of Service: [name], [name] Breach. 02/21/2020 10:45 A M Medical Record Number: [id number] Patient Account Number: [id number] Date of Birth/Sex: Treating RN: [birth year] (69 y.o. [name] Primary Care Kartik Fernando: [name] Other Clinician: Referring Cole Eastridge: Treating Nayeli Calvert/Extender: [name] in Treatment: 47 Vital Signs Height(in): 63 Pulse(bpm): 84 Weight(lbs): 185 Blood Pressure(mmHg): 101/67 Body Mass Index(BMI): 33 Temperature(F): 97.4 Respiratory Rate(breaths/min): 18 Photos: [12:No Photos Left Ischial Tuberosity] [N/A:N/A N/A] Wound Location: [12:Pressure Injury] [N/A:N/A] Wounding Event: [12:Pressure Ulcer] [N/A:N/A] Primary Etiology: [12:Anemia, Hypertension, Type II] [N/A:N/A] Comorbid History: [12:Diabetes, Osteoarthritis, Dementia, Paraplegia, Received Radiation 09/06/2018] [N/A:N/A] Date Acquired: [12:59] [N/A:N/A] Weeks of Treatment: [12:Open] [N/A:N/A] Wound Status: [12:2.7x1x1] [N/A:N/A] Measurements L x W x D (cm) [12:2.121] [N/A:N/A] A (cm) : rea [12:2.121] [N/A:N/A] Volume (cm) : [12:-798.70%] [N/A:N/A] % Reduction in A rea: [12:-900.50%] [N/A:N/A] % Reduction in Volume: [12:Category/Stage IV] [N/A:N/A] Classification: [12:Medium] [N/A:N/A] Exudate A mount: [12:Serosanguineous] [N/A:N/A] Exudate Type: [12:red, brown] [N/A:N/A] Exudate Color: [12:Well defined, not attached] [N/A:N/A] Wound Margin: [12:Large (67-100%)] [N/A:N/A] Granulation Amount: [12:Red, Pink] [N/A:N/A] Granulation Quality: [12:None Present (0%)] [N/A:N/A] Necrotic Amount: [12:Fat Layer (Subcutaneous Tissue): Yes N/A] Exposed Structures: [12:Fascia: No Tendon: No Muscle: No Joint: No Bone: No Small (1-33%)]  [N/A:N/A] Treatment Notes Electronic Signature(s) Signed: 02/21/2020 4:54:15 PM By: Linton Ham MD Signed: 02/21/2020 5:00:30 PM By: Carlene Coria RN Entered By: Linton Ham on 02/21/2020 12:42:44 -------------------------------------------------------------------------------- Multi-Disciplinary Care Plan Details Patient Name: Date of Service: [name], [name] Posey M. 02/21/2020 10:45 A M Medical Record Number: [id number] Patient Account Number: [id number] Date of Birth/Sex: Treating RN: 12-12-1950 (69 y.o. [name] Primary Care Lilymae Swiech: [name] Other Clinician: Referring Davieon Stockham: Treating Burney Calzadilla/Extender: Dellia Nims  [name], [name] in Treatment: 59 Active Inactive Wound/Skin Impairment Nursing Diagnoses: Knowledge deficit related to ulceration/compromised skin integrity Goals: Patient/caregiver will verbalize understanding of skin care regimen Date Initiated: 12/30/2018 Target Resolution Date: 03/05/2020 Goal Status: Active Interventions: Assess patient/caregiver ability to obtain necessary supplies Assess patient/caregiver ability to perform ulcer/skin care regimen upon admission and as needed Assess ulceration(s) every visit Provide education on ulcer and skin care Treatment Activities: Skin care regimen initiated : 12/30/2018 Topical wound management initiated : 12/30/2018 Notes: Electronic Signature(s) Signed: 02/21/2020 5:00:30 PM By: Carlene Coria RN Entered By: Carlene Coria on 02/21/2020 10:38:42 -------------------------------------------------------------------------------- Pain Assessment Details Patient Name: Date of Service: [name]. 02/21/2020 10:45 A M Medical Record Number: [id number] Patient Account Number: [id number] Date of Birth/Sex: Treating RN: 02-16-1951 (69 y.o. [name] Primary Care Willow Shidler: [name] Other Clinician: Referring Aleysha Meckler: Treating Jakim Drapeau/Extender: [name] in  Treatment: 59 Active Problems Location of Pain Severity and Description of Pain Patient Has Paino No Site Locations Rate the pain. Current Pain Level: 0 Pain Management and Medication Current Pain Management: Medication: No Cold Application: No Rest: No Massage: No Activity: No T.E.N.S.: No Heat Application: No Leg drop or elevation: No Is the Current Pain Management Adequate: Adequate How does your wound impact your activities of daily livingo Sleep: No Bathing: No Appetite: No Relationship With Others: No Bladder Continence: No Emotions: No Bowel Continence: No Work: No Toileting: No Drive: No Dressing: No Hobbies: No Electronic Signature(s) Signed: 02/21/2020 12:08:35 PM By: Deon Pilling Entered By: Deon Pilling on 02/21/2020 11:18:39 -------------------------------------------------------------------------------- Patient/Caregiver Education Details Patient Name: Date of Service: [name] 11/16/2021andnbsp10:45 A M Medical Record Number: [id number] Patient Account Number: [id number] Date of Birth/Gender: Treating RN: Mar 29, 1951 (69 y.o. [name] Primary Care Physician: [name] Other Clinician: Referring Physician: Treating Physician/Extender: [name] in Treatment: 10 Education Assessment Education Provided To: Patient Education Topics Provided Wound/Skin Impairment: Methods: Explain/Verbal Responses: State content correctly Electronic Signature(s) Signed: 02/21/2020 5:00:30 PM By: Carlene Coria RN Entered By: Carlene Coria on 02/21/2020 10:38:59 -------------------------------------------------------------------------------- Wound Assessment Details Patient Name: Date of Service: [name], [name] 02/21/2020 10:45 A M Medical Record Number: [id number] Patient Account Number: [id number] Date of Birth/Sex: Treating RN: Feb 05, 1951 (69 y.o. [name], [name] Primary Care Demir Titsworth: [name] Other  Clinician: Referring Ezequias Lard: Treating Annaka Cleaver/Extender: [name] in Treatment: 59 Wound Status Wound Number: 12 Primary Pressure Ulcer Etiology: Wound Location: Left Ischial Tuberosity Wound Open Wounding Event: Pressure Injury Status: Date Acquired: 09/06/2018 Comorbid Anemia, Hypertension, Type II Diabetes, Osteoarthritis, Weeks Of Treatment: 59 History: Dementia, Paraplegia, Received Radiation Clustered Wound: No Wound Measurements Length: (cm) 2.7 Width: (cm) 1 Depth: (cm) 1 Area: (cm) 2.121 Volume: (cm) 2.121 % Reduction in Area: -798.7% % Reduction in Volume: -900.5% Epithelialization: Small (1-33%) Tunneling: No Undermining: No Wound Description Classification: Category/Stage IV Wound Margin: Well defined, not attached Exudate Amount: Medium Exudate Type: Serosanguineous Exudate Color: red, brown Foul Odor After Cleansing: No Slough/Fibrino No Wound Bed Granulation Amount: Large (67-100%) Exposed Structure Granulation Quality: Red, Pink Fascia Exposed: No Necrotic Amount: None Present (0%) Fat Layer (Subcutaneous Tissue) Exposed: Yes Tendon Exposed: No Muscle Exposed: No Joint Exposed: No Bone Exposed: No Electronic Signature(s) Signed: 02/21/2020 12:08:35 PM By: Deon Pilling Entered By: Deon Pilling on 02/21/2020 11:19:17 -------------------------------------------------------------------------------- Vitals Details Patient Name: Date of Service: [name], [name] Posey M. 02/21/2020 10:45 A M Medical Record Number: [id number] Patient Account  Number: [id number] Date of Birth/Sex: Treating RN: 1951-03-07 (69 y.o. [name], [name] Primary Care Boni Maclellan: [name] Other Clinician: Referring Lavoris Canizales: Treating Kaede Clendenen/Extender: [name] in Treatment: 59 Vital Signs Time Taken: 11:15 Temperature (F): 97.4 Height (in): 63 Pulse (bpm): 84 Weight (lbs): 185 Respiratory Rate (breaths/min): 18 Body Mass  Index (BMI): 32.8 Blood Pressure (mmHg): 101/67 Reference Range: 80 - 120 mg / dl Electronic Signature(s) Signed: 02/21/2020 12:08:35 PM By: Deon Pilling Entered By: Deon Pilling on 02/21/2020 11:18:13

## 2020-03-20 ENCOUNTER — Encounter (HOSPITAL_BASED_OUTPATIENT_CLINIC_OR_DEPARTMENT_OTHER): Payer: Medicare Other | Admitting: Internal Medicine

## 2020-03-27 ENCOUNTER — Other Ambulatory Visit: Payer: Self-pay

## 2020-03-27 ENCOUNTER — Encounter (HOSPITAL_BASED_OUTPATIENT_CLINIC_OR_DEPARTMENT_OTHER): Payer: Medicare Other | Attending: Internal Medicine | Admitting: Internal Medicine

## 2020-03-27 DIAGNOSIS — G8220 Paraplegia, unspecified: Secondary | ICD-10-CM | POA: Diagnosis not present

## 2020-03-27 DIAGNOSIS — L97811 Non-pressure chronic ulcer of other part of right lower leg limited to breakdown of skin: Secondary | ICD-10-CM | POA: Insufficient documentation

## 2020-03-27 DIAGNOSIS — L89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, stage 4: Secondary | ICD-10-CM | POA: Diagnosis not present

## 2020-03-27 DIAGNOSIS — E11622 Type 2 diabetes mellitus with other skin ulcer: Secondary | ICD-10-CM | POA: Diagnosis not present

## 2020-03-28 NOTE — Progress Notes (Signed)
ALEXSUS, PAPADOPOULOS ([id number]) Visit Report for 03/27/2020 Debridement Details Patient Name: Date of Service: ELFREDA, BLANCHET 03/27/2020 10:45 A M Medical Record Number: [id number] Patient Account Number: [id number] Date of Birth/Sex: Treating RN: September 30, 1950 (69 y.o. [name], [name] Primary Care Provider: [name] Other Clinician: Referring Provider: Treating Provider/Extender: Tommy Rainwater in Treatment: 64 Debridement Performed for Assessment: Wound #13 Right,Distal,Anterior Lower Leg Performed By: Physician Ricard Dillon., MD Debridement Type: Debridement Severity of Tissue Pre Debridement: Fat layer exposed Level of Consciousness (Pre-procedure): Awake and Alert Pre-procedure Verification/Time Out Yes - 12:33 Taken: Start Time: 12:33 Pain Control: Other : Benzocaine T Area Debrided (L x W): otal 3.5 (cm) x 0.7 (cm) = 2.45 (cm) Tissue and other material debrided: Viable, Non-Viable, Slough, Subcutaneous, Skin: Dermis , Skin: Epidermis, Slough Level: Skin/Subcutaneous Tissue Debridement Description: Excisional Instrument: Curette Bleeding: Minimum Hemostasis Achieved: Pressure End Time: 12:34 Procedural Pain: 0 Post Procedural Pain: 0 Response to Treatment: Procedure was tolerated well Level of Consciousness (Post- Awake and Alert procedure): Post Debridement Measurements of Total Wound Length: (cm) 3.5 Width: (cm) 0.7 Depth: (cm) 0.1 Volume: (cm) 0.192 Character of Wound/Ulcer Post Debridement: Improved Severity of Tissue Post Debridement: Fat layer exposed Post Procedure Diagnosis Same as Pre-procedure Electronic Signature(s) Signed: 03/27/2020 5:57:42 PM By: Linton Ham MD Signed: 03/28/2020 5:16:00 PM By: Rhae Hammock RN Entered By: Linton Ham on 03/27/2020 12:58:36 -------------------------------------------------------------------------------- HPI Details Patient Name: Date of Service: [name]  03/27/2020 10:45 A M Medical Record Number: [id number] Patient Account Number: [id number] Date of Birth/Sex: Treating RN: 1950/05/26 (69 y.o. [name], [name] Primary Care Provider: [name] Other Clinician: Referring Provider: Treating Provider/Extender: Tommy Rainwater in Treatment: 64 History of Present Illness Location: open ulceration of the left gluteal area, left heel and right ankle for about 5 months. Quality: Patient reports No Pain. Severity: Patient states wound(s) are getting worse. Duration: Patient has had the wound for > 5 months prior to seeking treatment at the wound center Context: The wound occurred when the patient has been paraplegic for about 3 years. Modifying Factors: Wound improving due to current treatment. ssociated Signs and Symptoms: Patient reports having foul odor. A HPI Description: this 69 year old patient who is known to have hypertension, hypothyroidism, breast cancer, chronic pain syndrome, paraplegia was noted to have a left gluteal decubitus ulcer and was brought into the hospital. During the course of her hospitalization she was debrided in the operating room by Dr. Leland Johns and had all the wounds sharply debrided. The debridement was done for the left ischial wound, the left heel wound and the right ankle wound. Bone cultures were taken at that time but were negative but clinically she was treated for osteomyelitis because of the probing down to bone and open exposed bone. Home health has been giving her antibioticss which include vancomycin and Zosyn. The patient was a smoker until about 3 weeks ago and used to smoke about 10 cigarettes a day for a long while. 12/13/2014 - details of her operative note from 11/03/2014 were reviewed -- PROCEDURE: 1. Excisional debridement skin, subcutaneous, muscle left ischium 35 cm2 2. Excisional debridement skin, subcutaneous tissue left heel 27 cm2 3. Excisional debridement right ankle skin,  subcutaneous, bone 30 cm2 01/24/2015 -- she has some issues with her wheelchair cushion but other than that is doing very well and has received Podus boots for her feet. 02/14/2015 -- she was using her old offloading boots and this seemed  to have caused her a new pressure ulcer on the left posterior heel near the superior part just below the Achilles tendon. 03/07/2015 -- she has a new ulceration just to the left of the midline on her sacral region more on the left buttock and this has been there for about a week. 08/22/2015 -- was recently admitted to hospital between May 5 and 08/13/2015, with sepsis and leukocytosis due to a UTI. she was treated for a sepsis complicating Escherichia coli UTI and kidney stones. She also had metabolic and careful up at the secondary to pyelonephritis. He received broad-spectrum antibiotics initially and then received Macrobid as per urology. She was sent home on nitrofurantoin. during her admission she had a CT scan which showed exposed left ischial tuberosity without evidence of osteolysis. 09/12/2015-- the patient is having some issues with her air mattress and would like to get a opinion from medical modalities. 10/10/2015 -- the issue with her air mattress has not yet been sorted out and the new problem seems to be a lot of odor from the wound VAC. 11/27/2015 -- the patient was admitted to the hospital between July 23 and 10/31/2015. Her problems were sepsis, osteomyelitis of the pelvic bone and acute pyelonephritis. CT of the abdomen and pelvis was consistent with a left-sided pyelonephritis with hydronephrosis and also just showed new sclerosis of the posterior portion of the left anterior pubic ramus suggestive of periosteal reaction consistent with osteomyelitis. She was treated for the osteomyelitis with infectious disease consult recommending 6 weeks of IV antibiotics including vancomycin and Rocephin and the antibiotics were to go on until 12/10/2015.  He was seen by Dr. Iran Planas plastic surgery and Dr. Linus Salmons of infectious disease. She had a suprapubic catheter placed during the admission. CT scan done on 10/28/2015 showed specifically -- New sclerosis of the posterior portion of the left inferior pubic ramus with aggressive periosteal reaction, consistent with osteomyelitis, with adjacent soft tissue gas compatible with previously described decubitus ulcer. 12/12/2015 -- she was recently seen by Dr. Linus Salmons, who noted good improvement and CRP and ESR compared to before and he has stopped her antibiotic as per plans to finish on September 4. The patient was encouraged to continue with wound care and consider hyperbaric oxygen therapy. Today she tells me that she has consented to undergo hyperbaric oxygen therapy and we can start the paperwork. 01/02/2016 -- her PCP had gained about 3 years but she still persists in having problems during hyperbaric oxygen therapy with some discomfort in the ears. 01/09/16; pressure area with underlying osteomyelitis in the left buttock. Wound bed itself has some slight amount of grayish surface slough however I do not think any debridement was necessary. There is no exposed bone soft tissue appears stable. She is using a wound VAC 01/16/16; back for weekly wound review in conjunction with HBO. She has a deep wound over the left initial tuberosity previously treated with 6 weeks of IV antibiotics for osteomyelitis. Wound bed looks reasonably healthy although the base of this is still precariously close to bone. She has been using a wound VAC. 01/23/2016 -- she has completed her course of antibiotics and this week the only new thing is her right great toe nail was avulsed and she has got an open wound over the nailbed. 01/31/16 she has completed her course of antibiotics. Her right great toenail avulsed last week and she's been using silver alginate for this as well. Still using a wound VAC to the substantial stage IV  wound over the left ischial tuberosity 03/05/2016 -- the patient has had a opinion from the plastic surgery group at [facility] and details of this are not available yet but the patient's verbal report has been heard by me. Did not sound like there was any optimistic discussion regarding reconstruction and the net result would be to continue with the wound VAC application. I will await the official reports. Addendum: -- she was seen at [facility] by Dr. Tressa Busman. After a thorough review and from what I understand spending 45 minutes with the patient his assessment has been noted by me in detail and the management options were: 1. Continued pressure offloading and wound care versus operative procedures including wound excision 2. Soft tissue and bone sampling 3. If the wound gets larger wound closure would be done using a variety of plastic surgical techniques including but not limited to skin substitute, possible skin graft, local versus regional flaps, negative pressure dressing application. 4. He discussed with her details of flap surgery and the risks associated 5. He made a comment that since the patient was operated on by Dr. Leland Johns of [facility] plastic surgery unit in [place] the patient may continue to follow-up there for further evaluation for surgical flap closure in the future. 03/19/2016 -- the patient continues to be rather depressed and frustrated with her lack of rapid progress in healing this wound especially because she thought after hyperbaric oxygen therapy the wound would heal extremely fast. She now understands that was not the implied benefit on wound care which was the recommendation for hyperbaric oxygen therapy. I have had a lengthy discussion with the patient and her husband regarding her options: 1. Continue with collagen and wound VAC for the primary dressing and offloading and all supportive care. 2. See Dr.  Iran Planas for possible placement of Acell or Integra in the OR. 3. get a second opinion from a wound care center and surrounding regions/counties 05/07/2016 -- Note from Dr. Celedonio Miyamoto, who noted that the patient has declined flap surgery. She has discussed application of A cell, and try a few applications to see how the wound progresses. She is also recommended that we could apply products here in the wound center, like Oasis. during her preop workup it was found that her hemoglobin A1c was 11% and she has now been diagnosed as having diabetes mellitus and has been put on appropriate treatment by her PCP 05/28/2016 -- tells me her blood sugars have been doing well and she has an appointment to see her PCP in the next couple of weeks to check her hemoglobin A1c. Other than that she continues to do well. 06/25/2016 -- have not seen her back for the last month but she says her health has been about the same and she has an appointment to check the A1c next week 09/10/16 ---- was seen by Dr. Celedonio Miyamoto -- who applied Acell and saw her back in follow-up. She has recommended silver alginate to the wound every other day and cover with foam. If no significant drainage could transition to collagen every other day. She recommended discontinuing wound VAC. There were no plans to repeat application of Acell. The patient expressed that her husband could do the wound care as going to the Wound Ctr., would cost several $100 for each visit. 10/21/2016 -- her insurance company is getting her new mattress and she is pleased about that. Other than that she has been doing dressings  with PolyMem Silver and has been doing very well 02/18/2017 -- she has gone through several changes of her mattress and has not been pleased with any of them. The ventricles are still working on trying to fit her with the appropriate low air-loss mattress. She has a new wound on the gluteal area which is clearly separated from the  original wound. 03/25/17-she is here in follow-up evaluation for her left ischialpressure ulcer. She remains unsatisfied with her pressure mattress. She admits to sitting multiple hours a day, in the bed. We have discussed offloading options. The wound does not appear infected. Nutrition does not appear to be a concern. Will follow-up in 4 weeks, if wound continues to be stalled may consider x-ray to evaluate for refractory osteomyelitis. 04/21/17; this is a patient that I don't know all that well. She has a chronic wound which at one point had underlying osteomyelitis in the left ischial tuberosity. This is a stage IV pressure ulcer. Over the last 3 months she has a stage II wound inferiorly to the original wound. The last time she was here her dressing was changed to silver collagen although the patient's husband who changes the dressing said that the collagen stuck to the wound and remove skin from the superficial area therefore he switched back to [place] 05/13/17; this is a patient we've been following for a left ischial tuberosity wound which was stage IV at one point had underlying osteomyelitis. Over the last several months she's had a stage II wound just inferior and medial to the related to the wound. According to her husband he is using Endoform layer with collagen although this is not what I had last time. According to her husband they are using Elgie [place] with collagen although I don't quite know how that started. She was hospitalized from 1/20 through 04/30/16. This was related to a UTI. Her blood cultures were negative, urine culture showed multiple species. She did have a CT scan of the abdomen and pelvis which documented chronic osteomyelitis in the area of the wound inflammatory markers were unremarkable. She has had prior knowledge of osteomyelitis. It looks as though she received IV antibiotics in 2017 and was treated with a course of hyperbaric oxygen. 05/28/17; the wound over  the left ischial tuberosity is deeper today and abuts clearly on bone. Nursing intake reported drainage. I therefore culture of the wound. The more superficial area just below this looks about the same. They once again complained that there are mattress cover is not working although apparently [facility] is been noted to see this many times in the report is that the device is functional 06/18/17; the patient had a probing area on the left ischial tuberosity that was draining purulent fluid last time. This also clearly seemed to have open bone. Culture I did showed pansensitive pseudomonas including third generation cephalosporins. I treated this with cefdinir 300 twice a day for 10 days and things seem to have improved. She has a more superficial wound just underneath this area. Amazingly she has a new air mattress through [facility]. I think they gave this to her as a parking give. In any case this now works according to the patient may have something to do with why the areas are looking better. 07/09/17; the patient has a probing area in the left ischial tuberosity that still has some depth. However this is contracted in terms of the wound orifice although the depth is still roughly the same. There is  no undermining. She also has the satellite wound which is more superficial. This appears to have a healthy surface we've been using silver collagen 08/06/17; the patient's wound is over the left ischial tuberosity and a satellite lesion just underneath this. The original wound was actually a deep stage 4 wound. We have made good progress in 2 months and there is no longer exposed bone here. 09/03/17; left ischial tuberosity actually appears to be quite healthy. I think we are making progress. No debridement is required. There is no surrounding erythema 10/01/17 I follow this patient monthly for her left ischial tuberosity wound. There is 2 areas the original area and a satellite area. The  satellite area looks a lot better there is no surrounding erythema. Her husband relates that he is having trouble maintaining the dressing. This has to do with the soft tissue around it. He states he puts the collagen in but he cannot make sure that it stays in even with the ABD pads and tape that he is been using 10/29/17; patient arrives with a better looking noon today. Some of the satellite lesions have closed. using Prisma 11/26/17; the patient has a large cone-shaped area with the tip of the Cone deep within her buttock soft tissue. The walls of the Cone are epithelialized however the base is still open. The area at the base of this looks moist we've been using silver collagen. Will change to silver alginate 12/31/2017; the wound appears to have come in fairly nicely. Using silver alginate. There is no surrounding maceration or infection 01/28/18; there is still an open area here over the left initial tuberosity. Base of this however looks healthy. There is no surrounding infection 02/25/18; the area of its open is over the left ischial tuberosity. The base of this is where the wound is. This is a large inverted cone-shaped area with the wound at the tip. Dimensions of the wound at the tip are improved. There is a area of denuded skin about halfway towards the tip which her husband thinks may have happened today when he was bathing her. 04/20/17; the area is still open over the left initial tuberosity. 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 This is now completely closed over although it is closed over with a divot and skin over bone at the base of this. Continued aggressive offloading will be necessary. 12/30/2018 READMISSION This is a 69 year old woman with chronic paraplegia. I picked her up for her  care from Dr. Con Memos in this clinic after he departed. She had a stage IV pressure wound over the left ischial tuberosity. She was treated twice for her underlying osteomyelitis and this I believe firstly in 2016 and again in 2017. There were some plans at some point for flap closure of this however she was discovered to have uncontrolled diabetes and I do not think this was ever accomplished. She ultimately healed over in this clinic and was discharged in May. She has a large cone-shaped indentation with the tip of this going towards the left ischial tuberosity. It is not an easy area to examine but at that time I thought all of this was epithelialized. Apparently there was a reopening here shortly after she left the clinic last time. She was admitted to hospital at the end of June  for Klebsiella bacteremia felt to be secondary to UTI. A CT scan of the pelvis is listed below and there was initially some concern that she had underlying osteomyelitis although I believe she was seen by infectious disease and that was felt to be not the case: I do not see any new cultures or inflammatory markers IMPRESSION: 1. No CT evidence for acute intra-abdominal or pelvic abnormality. Large volume of stool throughout the colon. 2. Enlarged fatty liver with fat sparing near the gallbladder fossa 3. Cortical scarring right kidney. Bilateral intrarenal stones without hydronephrosis. Thick-walled urinary bladder decompressed by suprapubic catheter 4. Deep left decubitus ulcer with underlying left ischial changes suggesting osteomyelitis. Her husband has been using silver collagen in the wound. She has not been systemically unwell no fever chills eating and drinking well. They rigorously offload this wound only getting up in the wheelchair when she is going to appointments the rest of the time she is in bed. 10/8; wound measures larger and she now has exposed bone. We have been using silver alginate 11/12 still  using silver alginate. The patient saw Dr. Megan Salon of infectious disease. She was started on Augmentin 500 mg twice daily. She is due to follow-up with Dr. Megan Salon I believe next week. Lab work Dr. Megan Salon requested showed a sedimentation rate of 28 and CRP of 20 although her CRP 1 year ago was 18.8. Sedimentation rate 1 year ago was 11 basic metabolic panel showed a creatinine of 1.12 12/3; the patient followed up with Dr. Megan Salon yesterday. She is still on Augmentin twice daily. This was directed by Dr. Megan Salon. The patient's inflammatory markers have improved which is gratifying. Her C-reactive protein was repeated yesterday and follow-up booked with infectious disease in January. In addition I have been getting secure text messages I think from palliative care through [facility] [employer]. I think they were hoping to provide services to the patient in her home. They could not get a hold of the primary physician and so they reached out to me on 2 separate occasions. 12/17; patient last saw Dr. Megan Salon on 12/2. She is finishing up with Augmentin. Her C-reactive protein was 20 on 10/21, 10.1 on 11/19 and 17 on 12/2. The wound itself still has depth and undermining. We are using Santyl with the backing wet-to-dry 04/27/2019. The wound is gradually clearing up in terms of the surface although it is not filled in that much. Still abuts right against bone 2/4; patient with a deep pressure ulcer over the left ischial tuberosity. I thought she was going to follow-up with infectious disease to follow her inflammatory markers although the patient states that they stated that they did not need to see her unless we felt it was necessary. I will need to check their notes. In any case we ordered moistened silver collagen back with wet-to-dry to fill in the depth of the wound although apparently [facility] sent silver alginate which they have been using since they were here the last time. Is  obviously not what we ordered. 2/25. Not much change in this wound it is over the left ischial tuberosity recurrent wound. We have been using silver collagen with backing wet-to-dry. I think the wound is about the same. There is still some tunneling from about 10-12 o'clock over the ischial tuberosity itself 3/11; pressure ulcer over the left ischial tuberosity. Since she was last here the wound VAC was started and apparently going quite well. We are able to get the home health  company that accepts [place] healthcare which is in itself sometimes problematic. There is been improvements in the wound the tunneling seems to be better and is contracted nicely 4/8; 1 month follow-up. Since she was last here we have been using silver collagen under a wound VAC. Some minor contraction I think in wound volume. She is cared for diligently by her husband including pressure relief, incontinence management, nutritional support etc. 6/1; this is almost a 63-monthfollow-up. She is been using silver collagen under wound VAC. Circular area over the left ischial tuberosity. She has been using silver collagen under wound VAC 7/8; 1 month follow-up. Silver collagen under the VAC not really a lot of progress. Tissue at the base of the wound which is right against bone and the tissue next that this does not look completely viable. She is not currently on any antibiotics, she had underlying osteomyelitis I need to look this over 8/16; we are using silver collagen under wound VAC to the left ischial tuberosity wound. Comes in today with absolutely no change in surface area or depth. There is no exposed bone. I did look over her infectious disease notes as I said I would do last time. She last saw Dr. CMegan Salonin December 2020. She completed 6 weeks of Augmentin. This was in response to a bone culture I did showing methicillin susceptible staph aureus and Enterococcus. She was supposed to come back to see Dr. CMegan Salonat some  point although they say that that appointment was canceled unless I chose to recommend return. I think there was supposed to be follow-up with inflammatory markers but I cannot see that that was ever done. She has not been on antibiotics since 9/21; monthly follow-up. We received a call from home health nurse last evening to report green drainage coming out of the wound. Lab work I ordered last time showed a white count of 5.2 a sedimentation rate of 45 and a C-reactive protein of 25 however neither one of the 2 values are substantially different from her previous values in October 2020 or December 2020. Both are slightly higher but only marginally. Otherwise no new complaints from the patient or her husband 10/19; 1 month follow-up. PCR culture I did last time showed medium quantities of Pseudomonas lower quantities Klebsiella and Enterococcus faecalis group B strep and Peptostreptococcus. I gave her Augmentin for 2 weeks. I am not really sure of my choice of this I would not cover Pseudomonas. She is still having green drainage. Wound itself looks satisfactory there is not a lot of depth wound bed looks healthy 11/16; patient has completed the antibiotics still using gentamicin and silver alginate on the wound. There is improvement in the surface area 12/21; in general the area on the buttock looks somewhat better. Surface looks healthy although I do not know that there is been much improvement in the wound volume. We have been using silver alginate and Hydrofera Blue. Less drainage. In passing the husband showed me an abrasion injury on the left anterior tibia. Covered in necrotic surface. He has noticed this for about a week and has been putting silver collagen on it. He is completely uncertain about how this happened Electronic Signature(s) Signed: 03/27/2020 5:57:42 PM By: RLinton HamMD Entered By: RLinton Hamon 03/27/2020  13:01:47 -------------------------------------------------------------------------------- Physical Exam Details Patient Name: Date of Service: CBarry Brunner 03/27/2020 10:45 A M Medical Record Number: [id number] Account Number: [id number] of Birth/Sex: Treating RN: [id number]((69y.o. F)  Rhae Hammock Primary Care Provider: [name] Other Clinician: Referring Provider: Treating Provider/Extender: Tommy Rainwater in Treatment: 64 Constitutional Sitting or standing Blood Pressure is within target range for patient.. Pulse regular and within target range for patient.Marland Kitchen Respirations regular, non-labored and within target range.. Temperature is normal and within the target range for the patient.Marland Kitchen Appears in no distress. Notes Wound exam; left ischial tuberosity. This looks better than when I saw this last time there is no drainage no palpable bone. No need for debridement. New area on the right anterior lower tibia area. Completely necrotic surface I used a #3 curette to remove this and I am still not certain I got this down to a clean healthy surface Electronic Signature(s) Signed: 03/27/2020 5:57:42 PM By: Linton Ham MD Entered By: Linton Ham on 03/27/2020 13:02:48 -------------------------------------------------------------------------------- Physician Orders Details Patient Name: Date of Service: [name] 03/27/2020 10:45 A M Medical Record Number: [id number] Patient Account Number: [id number] Date of Birth/Sex: Treating RN: 1950-06-05 (69 y.o. [name], [name] Primary Care Provider: [name] Other Clinician: Referring Provider: Treating Provider/Extender: Tommy Rainwater in Treatment: 18 Verbal / Phone Orders: No Diagnosis Coding Follow-up Appointments Return appointment in 1 month. Bathing/ Shower/ Hygiene May shower and wash wound with soap and water. - with dressingchanges Edema Control -  Lymphedema / SCD / Other Elevate legs to the level of the heart or above for 30 minutes daily and/or when sitting, a frequency of: Avoid standing for long periods of time. Off-Loading Turn and reposition every 2 hours [place] wound care orders this week; continue Home Health for wound care. May utilize formulary equivalent dressing for wound treatment orders unless otherwise specified. Wound Treatment Wound #12 - Ischial Tuberosity Wound Laterality: Left Cleanser: Soap and Water Every Other Day/30 Days Discharge Instructions: May shower and wash wound with dial antibacterial soap and water prior to dressing change. Cleanser: Wound Cleanser [facility]) Every Other Day/30 Days Discharge Instructions: Cleanse the wound with wound cleanser prior to applying a clean dressing using gauze sponges, not tissue or cotton balls. Peri-Wound Care: Skin Prep [facility]) Every Other Day/30 Days Discharge Instructions: Use skin prep as directed Prim Dressing: Promogran Prisma Matrix, 4.34 (sq in) (silver collagen) (Home Health) Every Other Day/30 Days ary Discharge Instructions: Moisten collagen with saline or hydrogel Secondary Dressing: Optifoam Non-Adhesive Dressing, 4x4 in [facility]) Every Other Day/30 Days Discharge Instructions: Apply over primary dressing as directed. Wound #13 - Lower Leg Wound Laterality: Right, Anterior, Distal Cleanser: Soap and Water Every Other Day/30 Days Discharge Instructions: May shower and wash wound with dial antibacterial soap and water prior to dressing change. Cleanser: Wound Cleanser [facility]) Every Other Day/30 Days Discharge Instructions: Cleanse the wound with wound cleanser prior to applying a clean dressing using gauze sponges, not tissue or cotton balls. Peri-Wound Care: Skin Prep [facility]) Every Other Day/30 Days Discharge Instructions: Use skin prep as directed Prim Dressing: Promogran Prisma Matrix, 4.34 (sq in) (silver collagen)  (Home Health) Every Other Day/30 Days ary Discharge Instructions: Moisten collagen with saline or hydrogel Secondary Dressing: Optifoam Non-Adhesive Dressing, 4x4 in [facility]) Every Other Day/30 Days Discharge Instructions: Apply over primary dressing as directed. Electronic Signature(s) Signed: 03/27/2020 5:57:42 PM By: Linton Ham MD Signed: 03/28/2020 5:16:00 PM By: Rhae Hammock RN Entered By: Rhae Hammock on 03/27/2020 12:38:08 -------------------------------------------------------------------------------- Problem List Details Patient Name: Date of Service: [name], [name]. 03/27/2020 10:45 A M Medical Record  Number: [id number] Patient Account Number: [id number] Date of Birth/Sex: Treating RN: 1950-12-29 (69 y.o. [name], [name] Primary Care Provider: [name] Other Clinician: Referring Provider: Treating Provider/Extender: Tommy Rainwater in Treatment: 64 Active Problems ICD-10 Encounter Code Description Active Date MDM Diagnosis [phone] Pressure ulcer of left buttock, stage 4 12/30/2018 No Yes E11.622 Type 2 diabetes mellitus with other skin ulcer 12/30/2018 No Yes S80.811D Abrasion, right lower leg, subsequent encounter 03/27/2020 No Yes L97.811 Non-pressure chronic ulcer of other part of right lower leg limited to breakdown 03/27/2020 No Yes of skin Inactive Problems ICD-10 Code Description Active Date Inactive Date G82.20 Paraplegia, unspecified 12/30/2018 12/30/2018 M86.68 Other chronic osteomyelitis, other site 02/17/2019 02/17/2019 Resolved Problems Electronic Signature(s) Signed: 03/27/2020 5:57:42 PM By: Linton Ham MD Entered By: Linton Ham on 03/27/2020 12:58:18 -------------------------------------------------------------------------------- Progress Note Details Patient Name: Date of Service: [name] 03/27/2020 10:45 A M Medical Record Number: [id number] Patient Account Number:  [id number] Date of Birth/Sex: Treating RN: [birth year] (69 y.o. [name], [name] Primary Care Provider: [name] Other Clinician: Referring Provider: Treating Provider/Extender: Tommy Rainwater in Treatment: 64 Subjective History of Present Illness (HPI) The following HPI elements were documented for the patient's wound: Location: open ulceration of the left gluteal area, left heel and right ankle for about 5 months. Quality: Patient reports No Pain. Severity: Patient states wound(s) are getting worse. Duration: Patient has had the wound for > 5 months prior to seeking treatment at the wound center Context: The wound occurred when the patient has been paraplegic for about 3 years. Modifying Factors: Wound improving due to current treatment. Associated Signs and Symptoms: Patient reports having foul odor. this 69 year old patient who is known to have hypertension, hypothyroidism, breast cancer, chronic pain syndrome, paraplegia was noted to have a left gluteal decubitus ulcer and was brought into the hospital. During the course of her hospitalization she was debrided in the operating room by Dr. Leland Johns and had all the wounds sharply debrided. The debridement was done for the left ischial wound, the left heel wound and the right ankle wound. Bone cultures were taken at that time but were negative but clinically she was treated for osteomyelitis because of the probing down to bone and open exposed bone. Home health has been giving her antibioticss which include vancomycin and Zosyn. The patient was a smoker until about 3 weeks ago and used to smoke about 10 cigarettes a day for a long while. 12/13/2014 - details of her operative note from 11/03/2014 were reviewed -- PROCEDURE: 1. Excisional debridement skin, subcutaneous, muscle left ischium 35 cm2 2. Excisional debridement skin, subcutaneous tissue left heel 27 cm2 3. Excisional debridement right ankle skin, subcutaneous,  bone 30 cm2 01/24/2015 -- she has some issues with her wheelchair cushion but other than that is doing very well and has received Podus boots for her feet. 02/14/2015 -- she was using her old offloading boots and this seemed to have caused her a new pressure ulcer on the left posterior heel near the superior part just below the Achilles tendon. 03/07/2015 -- she has a new ulceration just to the left of the midline on her sacral region more on the left buttock and this has been there for about a week. 08/22/2015 -- was recently admitted to hospital between May 5 and 08/13/2015, with sepsis and leukocytosis due to a UTI. she was treated for a sepsis complicating Escherichia coli UTI and kidney stones. She also had metabolic and careful  up at the secondary to pyelonephritis. He received broad-spectrum antibiotics initially and then received Macrobid as per urology. She was sent home on nitrofurantoin. during her admission she had a CT scan which showed exposed left ischial tuberosity without evidence of osteolysis. 09/12/2015-- the patient is having some issues with her air mattress and would like to get a opinion from medical modalities. 10/10/2015 -- the issue with her air mattress has not yet been sorted out and the new problem seems to be a lot of odor from the wound VAC. 11/27/2015 -- the patient was admitted to the hospital between July 23 and 10/31/2015. Her problems were sepsis, osteomyelitis of the pelvic bone and acute pyelonephritis. CT of the abdomen and pelvis was consistent with a left-sided pyelonephritis with hydronephrosis and also just showed new sclerosis of the posterior portion of the left anterior pubic ramus suggestive of periosteal reaction consistent with osteomyelitis. She was treated for the osteomyelitis with infectious disease consult recommending 6 weeks of IV antibiotics including vancomycin and Rocephin and the antibiotics were to go on until 12/10/2015. He was seen by Dr.  Iran Planas plastic surgery and Dr. Linus Salmons of infectious disease. She had a suprapubic catheter placed during the admission. CT scan done on 10/28/2015 showed specifically -- New sclerosis of the posterior portion of the left inferior pubic ramus with aggressive periosteal reaction, consistent with osteomyelitis, with adjacent soft tissue gas compatible with previously described decubitus ulcer. 12/12/2015 -- she was recently seen by Dr. Linus Salmons, who noted good improvement and CRP and ESR compared to before and he has stopped her antibiotic as per plans to finish on September 4. The patient was encouraged to continue with wound care and consider hyperbaric oxygen therapy. Today she tells me that she has consented to undergo hyperbaric oxygen therapy and we can start the paperwork. 01/02/2016 -- her PCP had gained about 3 years but she still persists in having problems during hyperbaric oxygen therapy with some discomfort in the ears. 01/09/16; pressure area with underlying osteomyelitis in the left buttock. Wound bed itself has some slight amount of grayish surface slough however I do not think any debridement was necessary. There is no exposed bone soft tissue appears stable. She is using a wound VAC 01/16/16; back for weekly wound review in conjunction with HBO. She has a deep wound over the left initial tuberosity previously treated with 6 weeks of IV antibiotics for osteomyelitis. Wound bed looks reasonably healthy although the base of this is still precariously close to bone. She has been using a wound VAC. 01/23/2016 -- she has completed her course of antibiotics and this week the only new thing is her right great toe nail was avulsed and she has got an open wound over the nailbed. 01/31/16 she has completed her course of antibiotics. Her right great toenail avulsed last week and she's been using silver alginate for this as well. Still using a wound VAC to the substantial stage IV wound over the left  ischial tuberosity 03/05/2016 -- the patient has had a opinion from the plastic surgery group at [facility] and details of this are not available yet but the patient's verbal report has been heard by me. Did not sound like there was any optimistic discussion regarding reconstruction and the net result would be to continue with the wound VAC application. I will await the official reports. Addendum: -- she was seen at [facility] by Dr. Tressa Busman. After a thorough review and from  what I understand spending 45 minutes with the patient his assessment has been noted by me in detail and the management options were: 1. Continued pressure offloading and wound care versus operative procedures including wound excision 2. Soft tissue and bone sampling 3. If the wound gets larger wound closure would be done using a variety of plastic surgical techniques including but not limited to skin substitute, possible skin graft, local versus regional flaps, negative pressure dressing application. 4. He discussed with her details of flap surgery and the risks associated 5. He made a comment that since the patient was operated on by Dr. Leland Johns of [facility] plastic surgery unit in [place] the patient may continue to follow-up there for further evaluation for surgical flap closure in the future. 03/19/2016 -- the patient continues to be rather depressed and frustrated with her lack of rapid progress in healing this wound especially because she thought after hyperbaric oxygen therapy the wound would heal extremely fast. She now understands that was not the implied benefit on wound care which was the recommendation for hyperbaric oxygen therapy. I have had a lengthy discussion with the patient and her husband regarding her options: 1. Continue with collagen and wound VAC for the primary dressing and offloading and all supportive care. 2. See Dr. Iran Planas for possible  placement of Acell or Integra in the OR. 3. get a second opinion from a wound care center and surrounding regions/counties 05/07/2016 -- Note from Dr. Celedonio Miyamoto, who noted that the patient has declined flap surgery. She has discussed application of A cell, and try a few applications to see how the wound progresses. She is also recommended that we could apply products here in the wound center, like Oasis. during her preop workup it was found that her hemoglobin A1c was 11% and she has now been diagnosed as having diabetes mellitus and has been put on appropriate treatment by her PCP 05/28/2016 -- tells me her blood sugars have been doing well and she has an appointment to see her PCP in the next couple of weeks to check her hemoglobin A1c. Other than that she continues to do well. 06/25/2016 -- have not seen her back for the last month but she says her health has been about the same and she has an appointment to check the A1c next week 09/10/16 ---- was seen by Dr. Celedonio Miyamoto -- who applied Acell and saw her back in follow-up. She has recommended silver alginate to the wound every other day and cover with foam. If no significant drainage could transition to collagen every other day. She recommended discontinuing wound VAC. There were no plans to repeat application of Acell. The patient expressed that her husband could do the wound care as going to the Wound Ctr., would cost several $100 for each visit. 10/21/2016 -- her insurance company is getting her new mattress and she is pleased about that. Other than that she has been doing dressings with PolyMem Silver and has been doing very well 02/18/2017 -- she has gone through several changes of her mattress and has not been pleased with any of them. The ventricles are still working on trying to fit her with the appropriate low air-loss mattress. She has a new wound on the gluteal area which is clearly separated from the original  wound. 03/25/17-she is here in follow-up evaluation for her left ischialpressure ulcer. She remains unsatisfied with her pressure mattress. She admits to sitting multiple hours a day, in the bed. We  have discussed offloading options. The wound does not appear infected. Nutrition does not appear to be a concern. Will follow-up in 4 weeks, if wound continues to be stalled may consider x-ray to evaluate for refractory osteomyelitis. 04/21/17; this is a patient that I don't know all that well. She has a chronic wound which at one point had underlying osteomyelitis in the left ischial tuberosity. This is a stage IV pressure ulcer. Over the last 3 months she has a stage II wound inferiorly to the original wound. The last time she was here her dressing was changed to silver collagen although the patient's husband who changes the dressing said that the collagen stuck to the wound and remove skin from the superficial area therefore he switched back to [place] 05/13/17; this is a patient we've been following for a left ischial tuberosity wound which was stage IV at one point had underlying osteomyelitis. Over the last several months she's had a stage II wound just inferior and medial to the related to the wound. According to her husband he is using Endoform layer with collagen although this is not what I had last time. According to her husband they are using Elgie Congo with collagen although I don't quite know how that started. She was hospitalized from 1/20 through 04/30/16. This was related to a UTI. Her blood cultures were negative, urine culture showed multiple species. She did have a CT scan of the abdomen and pelvis which documented chronic osteomyelitis in the area of the wound inflammatory markers were unremarkable. She has had prior knowledge of osteomyelitis. It looks as though she received IV antibiotics in 2017 and was treated with a course of hyperbaric oxygen. 05/28/17; the wound over the left  ischial tuberosity is deeper today and abuts clearly on bone. Nursing intake reported drainage. I therefore culture of the wound. The more superficial area just below this looks about the same. They once again complained that there are mattress cover is not working although apparently [facility] is been noted to see this many times in the report is that the device is functional 06/18/17; the patient had a probing area on the left ischial tuberosity that was draining purulent fluid last time. This also clearly seemed to have open bone. Culture I did showed pansensitive pseudomonas including third generation cephalosporins. I treated this with cefdinir 300 twice a day for 10 days and things seem to have improved. She has a more superficial wound just underneath this area. Amazingly she has a new air mattress through [facility]. I think they gave this to her as a parking give. In any case this now works according to the patient may have something to do with why the areas are looking better. 07/09/17; the patient has a probing area in the left ischial tuberosity that still has some depth. However this is contracted in terms of the wound orifice although the depth is still roughly the same. There is no undermining. ooShe also has the satellite wound which is more superficial. This appears to have a healthy surface we've been using silver collagen 08/06/17; the patient's wound is over the left ischial tuberosity and a satellite lesion just underneath this. The original wound was actually a deep stage 4 wound. We have made good progress in 2 months and there is no longer exposed bone here. 09/03/17; left ischial tuberosity actually appears to be quite healthy. I think we are making progress. No debridement is required. There is no surrounding erythema 10/01/17  I follow this patient monthly for her left ischial tuberosity wound. There is 2 areas the original area and a satellite area. The satellite  area looks a lot better there is no surrounding erythema. Her husband relates that he is having trouble maintaining the dressing. This has to do with the soft tissue around it. He states he puts the collagen in but he cannot make sure that it stays in even with the ABD pads and tape that he is been using 10/29/17; patient arrives with a better looking noon today. Some of the satellite lesions have closed. using Prisma 11/26/17; the patient has a large cone-shaped area with the tip of the Cone deep within her buttock soft tissue. The walls of the Cone are epithelialized however the base is still open. The area at the base of this looks moist we've been using silver collagen. Will change to silver alginate 12/31/2017; the wound appears to have come in fairly nicely. Using silver alginate. There is no surrounding maceration or infection 01/28/18; there is still an open area here over the left initial tuberosity. Base of this however looks healthy. There is no surrounding infection 02/25/18; the area of its open is over the left ischial tuberosity. The base of this is where the wound is. This is a large inverted cone-shaped area with the wound at the tip. Dimensions of the wound at the tip are improved. There is a area of denuded skin about halfway towards the tip which her husband thinks may have happened today when he was bathing her. 04/20/17; the area is still open over the left initial tuberosity. 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 This is now completely closed over although it is closed over with a divot and skin over bone at the base of this. Continued aggressive offloading will be necessary. 12/30/2018 READMISSION This is a 69 year old woman with chronic paraplegia. I picked her up for her care from  Dr. Con Memos in this clinic after he departed. She had a stage IV pressure wound over the left ischial tuberosity. She was treated twice for her underlying osteomyelitis and this I believe firstly in 2016 and again in 2017. There were some plans at some point for flap closure of this however she was discovered to have uncontrolled diabetes and I do not think this was ever accomplished. She ultimately healed over in this clinic and was discharged in May. She has a large cone-shaped indentation with the tip of this going towards the left ischial tuberosity. It is not an easy area to examine but at that time I thought all of this was epithelialized. Apparently there was a reopening here shortly after she left the clinic last time. She was admitted to hospital at the end of June for Klebsiella bacteremia felt to be secondary to UTI. A CT scan of the pelvis is listed below and there was initially some concern that she had underlying osteomyelitis although I believe she was seen by infectious disease and that was felt to be not the case: I do not see any new cultures or inflammatory markers IMPRESSION: 1. No CT evidence for acute intra-abdominal or pelvic abnormality. Large volume of stool throughout the colon. 2. Enlarged fatty liver with fat sparing near the gallbladder fossa 3. Cortical scarring right kidney. Bilateral intrarenal  stones without hydronephrosis. Thick-walled urinary bladder decompressed by suprapubic catheter 4. Deep left decubitus ulcer with underlying left ischial changes suggesting osteomyelitis. Her husband has been using silver collagen in the wound. She has not been systemically unwell no fever chills eating and drinking well. They rigorously offload this wound only getting up in the wheelchair when she is going to appointments the rest of the time she is in bed. 10/8; wound measures larger and she now has exposed bone. We have been using silver alginate 11/12 still using silver  alginate. The patient saw Dr. Megan Salon of infectious disease. She was started on Augmentin 500 mg twice daily. She is due to follow-up with Dr. Megan Salon I believe next week. Lab work Dr. Megan Salon requested showed a sedimentation rate of 28 and CRP of 20 although her CRP 1 year ago was 18.8. Sedimentation rate 1 year ago was 11 basic metabolic panel showed a creatinine of 1.12 12/3; the patient followed up with Dr. Megan Salon yesterday. She is still on Augmentin twice daily. This was directed by Dr. Megan Salon. The patient's inflammatory markers have improved which is gratifying. Her C-reactive protein was repeated yesterday and follow-up booked with infectious disease in January. In addition I have been getting secure text messages I think from palliative care through [facility] [employer]. I think they were hoping to provide services to the patient in her home. They could not get a hold of the primary physician and so they reached out to me on 2 separate occasions. 12/17; patient last saw Dr. Megan Salon on 12/2. She is finishing up with Augmentin. Her C-reactive protein was 20 on 10/21, 10.1 on 11/19 and 17 on 12/2. The wound itself still has depth and undermining. We are using Santyl with the backing wet-to-dry 04/27/2019. The wound is gradually clearing up in terms of the surface although it is not filled in that much. Still abuts right against bone 2/4; patient with a deep pressure ulcer over the left ischial tuberosity. I thought she was going to follow-up with infectious disease to follow her inflammatory markers although the patient states that they stated that they did not need to see her unless we felt it was necessary. I will need to check their notes. In any case we ordered moistened silver collagen back with wet-to-dry to fill in the depth of the wound although apparently [facility] sent silver alginate which they have been using since they were here the last time. Is obviously not  what we ordered. 2/25. Not much change in this wound it is over the left ischial tuberosity recurrent wound. We have been using silver collagen with backing wet-to-dry. I think the wound is about the same. There is still some tunneling from about 10-12 o'clock over the ischial tuberosity itself 3/11; pressure ulcer over the left ischial tuberosity. Since she was last here the wound VAC was started and apparently going quite well. We are able to get the home health company that accepts [place] [facility] which is in itself sometimes problematic. There is been improvements in the wound the tunneling seems to be better and is contracted nicely 4/8; 1 month follow-up. Since she was last here we have been using silver collagen under a wound VAC. Some minor contraction I think in wound volume. She is cared for diligently by her husband including pressure relief, incontinence management, nutritional support etc. 6/1; this is almost a 63-monthfollow-up. She is been using silver collagen under wound VAC. Circular area over the left ischial  tuberosity. She has been using silver collagen under wound VAC 7/8; 1 month follow-up. Silver collagen under the VAC not really a lot of progress. Tissue at the base of the wound which is right against bone and the tissue next that this does not look completely viable. She is not currently on any antibiotics, she had underlying osteomyelitis I need to look this over 8/16; we are using silver collagen under wound VAC to the left ischial tuberosity wound. Comes in today with absolutely no change in surface area or depth. There is no exposed bone. I did look over her infectious disease notes as I said I would do last time. She last saw Dr. Megan Salon in December 2020. She completed 6 weeks of Augmentin. This was in response to a bone culture I did showing methicillin susceptible staph aureus and Enterococcus. She was supposed to come back to see Dr. Megan Salon at some point although  they say that that appointment was canceled unless I chose to recommend return. I think there was supposed to be follow-up with inflammatory markers but I cannot see that that was ever done. She has not been on antibiotics since 9/21; monthly follow-up. We received a call from home health nurse last evening to report green drainage coming out of the wound. Lab work I ordered last time showed a white count of 5.2 a sedimentation rate of 45 and a C-reactive protein of 25 however neither one of the 2 values are substantially different from her previous values in October 2020 or December 2020. Both are slightly higher but only marginally. Otherwise no new complaints from the patient or her husband 10/19; 1 month follow-up. PCR culture I did last time showed medium quantities of Pseudomonas lower quantities Klebsiella and Enterococcus faecalis group B strep and Peptostreptococcus. I gave her Augmentin for 2 weeks. I am not really sure of my choice of this I would not cover Pseudomonas. She is still having green drainage. Wound itself looks satisfactory there is not a lot of depth wound bed looks healthy 11/16; patient has completed the antibiotics still using gentamicin and silver alginate on the wound. There is improvement in the surface area 12/21; in general the area on the buttock looks somewhat better. Surface looks healthy although I do not know that there is been much improvement in the wound volume. We have been using silver alginate and Hydrofera Blue. Less drainage. In passing the husband showed me an abrasion injury on the left anterior tibia. Covered in necrotic surface. He has noticed this for about a week and has been putting silver collagen on it. He is completely uncertain about how this happened Objective Constitutional Sitting or standing Blood Pressure is within target range for patient.. Pulse regular and within target range for patient.Marland Kitchen Respirations regular, non-labored and within  target range.. Temperature is normal and within the target range for the patient.Marland Kitchen Appears in no distress. Vitals Time Taken: 11:38 AM, Height: 63 in, Weight: 185 lbs, BMI: 32.8, Temperature: 97.9 F, Pulse: 91 bpm, Respiratory Rate: 18 breaths/min, Blood Pressure: 127/69 mmHg. General Notes: Wound exam; left ischial tuberosity. This looks better than when I saw this last time there is no drainage no palpable bone. No need for debridement. ooNew area on the right anterior lower tibia area. Completely necrotic surface I used a #3 curette to remove this and I am still not certain I got this down to a clean healthy surface Integumentary (Hair, Skin) Wound #12 status is Open. Original cause of wound  was Pressure Injury. The wound is located on the Left Ischial Tuberosity. The wound measures 2cm length x 0.8cm width x 0.9cm depth; 1.257cm^2 area and 1.131cm^3 volume. There is Fat Layer (Subcutaneous Tissue) exposed. There is no tunneling or undermining noted. There is a medium amount of serosanguineous drainage noted. The wound margin is well defined and not attached to the wound base. There is large (67- 100%) red, pink granulation within the wound bed. There is no necrotic tissue within the wound bed. Wound #13 status is Open. Original cause of wound was Trauma. The wound is located on the Right,Distal,Anterior Lower Leg. The wound measures 3.5cm length x 0.7cm width x 0.1cm depth; 1.924cm^2 area and 0.192cm^3 volume. There is no tunneling or undermining noted. There is a medium amount of serosanguineous drainage noted. The wound margin is distinct with the outline attached to the wound base. There is medium (34-66%) red, pink granulation within the wound bed. There is a medium (34-66%) amount of necrotic tissue within the wound bed including Adherent Slough. 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 There was a Excisional Skin/Subcutaneous Tissue Debridement with a total area of 2.45 sq cm performed by Ricard Dillon., MD. With the following instrument(s): Curette to remove Viable and Non-Viable tissue/material. Material removed includes Subcutaneous Tissue, Slough, Skin: Dermis, and Skin: Epidermis after achieving pain control using Other (Benzocaine). No specimens were taken. A time out was conducted at 12:33, prior to the start of the procedure. A Minimum amount of bleeding was controlled with Pressure. The procedure was tolerated well with a pain level of 0 throughout and a pain level of 0 following the procedure. Post Debridement Measurements: 3.5cm length x 0.7cm width x 0.1cm depth; 0.192cm^3 volume. Character of Wound/Ulcer Post Debridement is improved. Severity of Tissue Post Debridement is: Fat layer exposed. Post procedure Diagnosis Wound #13: Same as Pre-Procedure Plan Follow-up Appointments: Return appointment in 1 month. Bathing/ Shower/ Hygiene: May shower and wash wound with soap and water. - with dressingchanges Edema Control - Lymphedema / SCD / Other: Elevate legs to the level of the heart or above for 30 minutes daily and/or when sitting, a frequency of: Avoid standing for long periods of time. Off-Loading: Turn and reposition every 2 hours Home Health: New wound care orders this week; continue Home Health for wound care. May utilize formulary equivalent dressing for wound treatment orders unless otherwise specified. WOUND #12: - Ischial Tuberosity Wound Laterality: Left Cleanser: Soap and Water Every Other Day/30 Days Discharge  Instructions: May shower and wash wound with dial antibacterial soap and water prior to dressing change. Cleanser: Wound Cleanser [facility]) Every Other Day/30 Days Discharge Instructions: Cleanse the wound with wound cleanser prior to applying a clean dressing using gauze sponges, not tissue or cotton balls. Peri-Wound Care: Skin Prep [facility]) Every Other Day/30 Days Discharge Instructions: Use skin prep as directed Prim Dressing: Promogran Prisma Matrix, 4.34 (sq in) (silver collagen) (Home Health) Every Other Day/30 Days ary Discharge Instructions: Moisten collagen with saline or hydrogel Secondary Dressing: Optifoam Non-Adhesive Dressing, 4x4 in [facility]) Every Other Day/30 Days Discharge Instructions: Apply over primary  dressing as directed. WOUND #13: - Lower Leg Wound Laterality: Right, Anterior, Distal Cleanser: Soap and Water Every Other Day/30 Days Discharge Instructions: May shower and wash wound with dial antibacterial soap and water prior to dressing change. Cleanser: Wound Cleanser [facility]) Every Other Day/30 Days Discharge Instructions: Cleanse the wound with wound cleanser prior to applying a clean dressing using gauze sponges, not tissue or cotton balls. Peri-Wound Care: Skin Prep [facility]) Every Other Day/30 Days Discharge Instructions: Use skin prep as directed Prim Dressing: Promogran Prisma Matrix, 4.34 (sq in) (silver collagen) (Home Health) Every Other Day/30 Days ary Discharge Instructions: Moisten collagen with saline or hydrogel Secondary Dressing: Optifoam Non-Adhesive Dressing, 4x4 in [facility]) Every Other Day/30 Days Discharge Instructions: Apply over primary dressing as directed. 1. We are going to use silver collagen to both wound areas 2. I do not think she needs to use the gentamicin topically on the buttock wound 3. I found myself wondering about an advanced treatment product in the left buttock although I wonder about the offloading  here. 4. New wound on the right anterior lower leg was debrided were going to use silver collagen as well. Presumably this was incidental trauma not Electronic Signature(s) Signed: 03/27/2020 5:57:42 PM By: Linton Ham MD Entered By: Linton Ham on 03/27/2020 13:03:53 -------------------------------------------------------------------------------- SuperBill Details Patient Name: Date of Service: [name] 03/27/2020 Medical Record Number: [id number] Patient Account Number: [id number] Date of Birth/Sex: Treating RN: 1950-09-26 (69 y.o. [name], [name] Primary Care Provider: [name] Other Clinician: Referring Provider: Treating Provider/Extender: Tommy Rainwater in Treatment: 62 Diagnosis Coding ICD-10 Codes Code Description [phone] Pressure ulcer of left buttock, stage 4 E11.622 Type 2 diabetes mellitus with other skin ulcer S80.811D Abrasion, right lower leg, subsequent encounter L97.811 Non-pressure chronic ulcer of other part of right lower leg limited to breakdown of skin Facility Procedures The patient participates with Medicare or their insurance follows the Medicare Facility Guidelines: CPT4 Code Description Modifier Quantity [id number] - DEB SUBQ TISSUE 20 SQ CM/< 1 ICD-10 Diagnosis Description S80.811D Abrasion, right lower leg,  subsequent encounter Physician Procedures : CPT4 Code Description Modifier [id number] - WC PHYS SUBQ TISS 20 SQ CM ICD-10 Diagnosis Description S80.811D Abrasion, right lower leg, subsequent encounter Quantity: 1 Electronic Signature(s) Signed: 03/27/2020 5:57:42 PM By: Linton Ham MD Entered By: Linton Ham on 03/27/2020 13:04:07

## 2020-03-28 NOTE — Progress Notes (Signed)
DREANNA, MEGGITT ([id number]) Visit Report for 03/27/2020 Arrival Information Details Patient Name: Date of Service: [name], [name] 03/27/2020 10:45 A M Medical Record Number: [id number] Patient Account Number: [id number] Date of Birth/Sex: Treating RN: 1950/10/06 (69 y.o. [name], [name] Primary Care Lorien Shingler: [name] Other Clinician: Referring Jadien Lehigh: Treating Markeith Jue/Extender: Tommy Rainwater in Treatment: 63 Visit Information History Since Last Visit Added or deleted any medications: No Patient Arrived: Wheel Chair Any new allergies or adverse reactions: No Arrival Time: 11:37 Had a fall or experienced change in No Accompanied By: husband activities of daily living that may affect Transfer Assistance: None risk of falls: Patient Identification Verified: Yes Signs or symptoms of abuse/neglect since last visito No Secondary Verification Process Completed: Yes Hospitalized since last visit: No Patient Requires Transmission-Based Precautions: No Implantable device outside of the clinic excluding No Patient Has Alerts: No cellular tissue based products placed in the center since last visit: Has Dressing in Place as Prescribed: Yes Pain Present Now: No Electronic Signature(s) Signed: 03/27/2020 2:20:57 PM By: Sandre Kitty Entered By: Sandre Kitty on 03/27/2020 11:38:14 -------------------------------------------------------------------------------- Multi Wound Chart Details Patient Name: Date of Service: [name] Brunner. 03/27/2020 10:45 A M Medical Record Number: [id number] Patient Account Number: [id number] Date of Birth/Sex: Treating RN: November 07, 1950 (69 y.o. [name], [name] Primary Care Pattiann Solanki: [name] Other Clinician: Referring Karel Mowers: Treating Byrdie Miyazaki/Extender: Tommy Rainwater in Treatment: 64 Vital Signs Height(in): 29 Pulse(bpm): 77 Weight(lbs): 185 Blood Pressure(mmHg): 127/69 Body Mass  Index(BMI): 33 Temperature(F): 97.9 Respiratory Rate(breaths/min): 18 Photos: [12:No Photos Left Ischial Tuberosity] [13:No Photos Right, Distal, Anterior Lower Leg] [N/A:N/A N/A] Wound Location: [12:Pressure Injury] [13:Trauma] [N/A:N/A] Wounding Event: [12:Pressure Ulcer] [13:Diabetic Wound/Ulcer of the Lower] [N/A:N/A] Primary Etiology: [12:Anemia, Hypertension, Type II] [13:Extremity Anemia, Hypertension, Type II] [N/A:N/A] Comorbid History: [12:Diabetes, Osteoarthritis, Dementia, Paraplegia, Received Radiation 09/06/2018] [13:Diabetes, Osteoarthritis, Dementia, Paraplegia, Received Radiation 03/23/2020] [N/A:N/A] Date Acquired: [12:64] [13:0] [N/A:N/A] Weeks of Treatment: [12:Open] [13:Open] [N/A:N/A] Wound Status: [12:2x0.8x0.9] [13:3.5x0.7x0.1] [N/A:N/A] Measurements L x W x D (cm) [12:1.257] [13:1.924] [N/A:N/A] A (cm) : rea [12:1.131] [13:0.192] [N/A:N/A] Volume (cm) : [12:-432.60%] [13:N/A] [N/A:N/A] % Reduction in A rea: [12:-433.50%] [13:N/A] [N/A:N/A] % Reduction in Volume: [12:Category/Stage IV] [13:Grade 2] [N/A:N/A] Classification: [12:Medium] [13:Medium] [N/A:N/A] Exudate A mount: [12:Serosanguineous] [13:Serosanguineous] [N/A:N/A] Exudate Type: [12:red, brown] [13:red, brown] [N/A:N/A] Exudate Color: [12:Well defined, not attached] [13:Distinct, outline attached] [N/A:N/A] Wound Margin: [12:Large (67-100%)] [13:Medium (34-66%)] [N/A:N/A] Granulation A mount: [12:Red, Pink] [13:Red, Pink] [N/A:N/A] Granulation Quality: [12:None Present (0%)] [13:Medium (34-66%)] [N/A:N/A] Necrotic A mount: [12:Fat Layer (Subcutaneous Tissue): Yes Fascia: No] [N/A:N/A] Exposed Structures: [12:Fascia: No Tendon: No Muscle: No Joint: No Bone: No Small (1-33%)] [13:Fat Layer (Subcutaneous Tissue): No Tendon: No Muscle: No Joint: No Bone: No None] [N/A:N/A] Epithelialization: [12:N/A] [13:Debridement - Excisional] [N/A:N/A] Debridement: Pre-procedure Verification/Time Out N/A [13:12:33]  [N/A:N/A] Taken: [12:N/A] [13:Other] [N/A:N/A] Pain Control: [12:N/A] [13:Subcutaneous, Slough] [N/A:N/A] Tissue Debrided: [12:N/A] [13:Skin/Subcutaneous Tissue] [N/A:N/A] Level: [12:N/A] [13:2.45] [N/A:N/A] Debridement A (sq cm): [12:rea N/A] [13:Curette] [N/A:N/A] Instrument: [12:N/A] [13:Minimum] [N/A:N/A] Bleeding: [12:N/A] [13:Pressure] [N/A:N/A] Hemostasis A chieved: [12:N/A] [13:0] [N/A:N/A] Procedural Pain: [12:N/A] [13:0] [N/A:N/A] Post Procedural Pain: [12:N/A] [13:Procedure was tolerated well] [N/A:N/A] Debridement Treatment Response: [12:N/A] [13:3.5x0.7x0.1] [N/A:N/A] Post Debridement Measurements L x W x D (cm) [12:N/A] [13:0.192] [N/A:N/A] Post Debridement Volume: (cm) [12:N/A] [13:Debridement] [N/A:N/A] Treatment Notes Electronic Signature(s) Signed: 03/27/2020 5:57:42 PM By: Linton Ham MD Signed: 03/28/2020 5:16:00 PM By: Rhae Hammock RN Entered By: Linton Ham on 03/27/2020 12:58:26 -------------------------------------------------------------------------------- [facility]  Plan Details Patient Name: Date of Service: [name], [name] 03/27/2020 10:45 A M Medical Record Number: [id number] Patient Account Number: [id number] Date of Birth/Sex: Treating RN: [birth year] (69 y.o. [name], [name] Primary Care Bowie Delia: [name] Other Clinician: Referring Orval Dortch: Treating Mathilda Maguire/Extender: Tommy Rainwater in Treatment: 64 Active Inactive Wound/Skin Impairment Nursing Diagnoses: Knowledge deficit related to ulceration/compromised skin integrity Goals: Patient/caregiver will verbalize understanding of skin care regimen Date Initiated: 12/30/2018 Target Resolution Date: 04/10/2020 Goal Status: Active Interventions: Assess patient/caregiver ability to obtain necessary supplies Assess patient/caregiver ability to perform ulcer/skin care regimen upon admission and as needed Assess ulceration(s) every visit Provide  education on ulcer and skin care Treatment Activities: Skin care regimen initiated : 12/30/2018 Topical wound management initiated : 12/30/2018 Notes: Electronic Signature(s) Signed: 03/28/2020 5:16:00 PM By: Rhae Hammock RN Entered By: Rhae Hammock on 03/27/2020 12:38:28 -------------------------------------------------------------------------------- Pain Assessment Details Patient Name: Date of Service: [name] Brunner. 03/27/2020 10:45 A M Medical Record Number: [id number] Patient Account Number: [id number] Date of Birth/Sex: Treating RN: 1950/12/29 (69 y.o. [name], [name] Primary Care Siddiq Kaluzny: [name] Other Clinician: Referring Celia Gibbons: Treating Ed Rayson/Extender: Tommy Rainwater in Treatment: 64 Active Problems Location of Pain Severity and Description of Pain Patient Has Paino No Site Locations Pain Management and Medication Current Pain Management: Electronic Signature(s) Signed: 03/27/2020 2:20:57 PM By: Sandre Kitty Signed: 03/28/2020 5:16:00 PM By: Rhae Hammock RN Entered By: Sandre Kitty on 03/27/2020 11:38:35 -------------------------------------------------------------------------------- Patient/Caregiver Education Details Patient Name: Date of Service: [name] Brunner 12/21/2021andnbsp10:45 A M Medical Record Number: [id number] Patient Account Number: [id number] Date of Birth/Gender: Treating RN: [birth year] (69 y.o. [name], [name] Primary Care Physician: [name] Other Clinician: Referring Physician: Treating Physician/Extender: Tommy Rainwater in Treatment: 18 Education Assessment Education Provided To: Patient Education Topics Provided Wound/Skin Impairment: Methods: Explain/Verbal Responses: State content correctly Electronic Signature(s) Signed: 03/28/2020 5:16:00 PM By: Rhae Hammock RN Entered By: Rhae Hammock on 03/27/2020  11:59:23 -------------------------------------------------------------------------------- Wound Assessment Details Patient Name: Date of Service: [name] Brunner. 03/27/2020 10:45 A M Medical Record Number: [id number] Patient Account Number: [id number] Date of Birth/Sex: Treating RN: Jul 08, 1950 (69 y.o. [name], [name] Primary Care Alphus Zeck: [name] Other Clinician: Referring Leodis Alcocer: Treating Noa Constante/Extender: Tommy Rainwater in Treatment: 64 Wound Status Wound Number: 12 Primary Pressure Ulcer Etiology: Wound Location: Left Ischial Tuberosity Wound Open Wounding Event: Pressure Injury Status: Date Acquired: 09/06/2018 Comorbid Anemia, Hypertension, Type II Diabetes, Osteoarthritis, Weeks Of Treatment: 64 History: Dementia, Paraplegia, Received Radiation Clustered Wound: No Wound Measurements Length: (cm) 2 Width: (cm) 0.8 Depth: (cm) 0.9 Area: (cm) 1.257 Volume: (cm) 1.131 % Reduction in Area: -432.6% % Reduction in Volume: -433.5% Epithelialization: Small (1-33%) Tunneling: No Undermining: No Wound Description Classification: Category/Stage IV Wound Margin: Well defined, not attached Exudate Amount: Medium Exudate Type: Serosanguineous Exudate Color: red, brown Foul Odor After Cleansing: No Slough/Fibrino No Wound Bed Granulation Amount: Large (67-100%) Exposed Structure Granulation Quality: Red, Pink Fascia Exposed: No Necrotic Amount: None Present (0%) Fat Layer (Subcutaneous Tissue) Exposed: Yes Tendon Exposed: No Muscle Exposed: No Joint Exposed: No Bone Exposed: No Electronic Signature(s) Signed: 03/28/2020 5:14:06 PM By: Carlene Coria RN Signed: 03/28/2020 5:16:00 PM By: Rhae Hammock RN Entered By: Carlene Coria on 03/27/2020 11:55:56 -------------------------------------------------------------------------------- Wound Assessment Details Patient Name: Date of Service: [name] Brunner. 03/27/2020 10:45 A  M Medical Record Number: [id number] Patient Account Number: [id number] Date of Birth/Sex: Treating RN:  [birth year] (69 y.o. [name], [name] Primary Care Trenita Hulme: [name] Other Clinician: Referring Yarel Kilcrease: Treating Tomislav Micale/Extender: Tommy Rainwater in Treatment: 64 Wound Status Wound Number: 13 Primary Diabetic Wound/Ulcer of the Lower Extremity Etiology: Wound Location: Right, Distal, Anterior Lower Leg Wound Open Wounding Event: Trauma Status: Date Acquired: 03/23/2020 Comorbid Anemia, Hypertension, Type II Diabetes, Osteoarthritis, Weeks Of Treatment: 0 History: Dementia, Paraplegia, Received Radiation Clustered Wound: No Wound Measurements Length: (cm) 3.5 Width: (cm) 0.7 Depth: (cm) 0.1 Area: (cm) 1.924 Volume: (cm) 0.192 % Reduction in Area: % Reduction in Volume: Epithelialization: None Tunneling: No Undermining: No Wound Description Classification: Grade 2 Wound Margin: Distinct, outline attached Exudate Amount: Medium Exudate Type: Serosanguineous Exudate Color: red, brown Foul Odor After Cleansing: No Slough/Fibrino Yes Wound Bed Granulation Amount: Medium (34-66%) Exposed Structure Granulation Quality: Red, Pink Fascia Exposed: No Necrotic Amount: Medium (34-66%) Fat Layer (Subcutaneous Tissue) Exposed: No Necrotic Quality: Adherent Slough Tendon Exposed: No Muscle Exposed: No Joint Exposed: No Bone Exposed: No Electronic Signature(s) Signed: 03/28/2020 5:16:00 PM By: Rhae Hammock RN Entered By: Rhae Hammock on 03/27/2020 12:34:29 -------------------------------------------------------------------------------- Vitals Details Patient Name: Date of Service: [name] Pulse M. 03/27/2020 10:45 A M Medical Record Number: [id number] Patient Account Number: [id number] Date of Birth/Sex: Treating RN: August 02, 1950 (69 y.o. [name], [name] Primary Care Ailany Koren: [name] Other Clinician: Referring  Gershon Shorten: Treating Anastyn Ayars/Extender: Tommy Rainwater in Treatment: 64 Vital Signs Time Taken: 11:38 Temperature (F): 97.9 Height (in): 63 Pulse (bpm): 91 Weight (lbs): 185 Respiratory Rate (breaths/min): 18 Body Mass Index (BMI): 32.8 Blood Pressure (mmHg): 127/69 Reference Range: 80 - 120 mg / dl Electronic Signature(s) Signed: 03/27/2020 2:20:57 PM By: Sandre Kitty Entered By: Sandre Kitty on 03/27/2020 11:38:29

## 2020-05-01 ENCOUNTER — Encounter (HOSPITAL_BASED_OUTPATIENT_CLINIC_OR_DEPARTMENT_OTHER): Payer: Medicare Other | Attending: Internal Medicine | Admitting: Internal Medicine

## 2020-05-01 ENCOUNTER — Other Ambulatory Visit: Payer: Self-pay

## 2020-05-01 DIAGNOSIS — X58XXXD Exposure to other specified factors, subsequent encounter: Secondary | ICD-10-CM | POA: Diagnosis not present

## 2020-05-01 DIAGNOSIS — G8220 Paraplegia, unspecified: Secondary | ICD-10-CM | POA: Diagnosis not present

## 2020-05-01 DIAGNOSIS — E11622 Type 2 diabetes mellitus with other skin ulcer: Secondary | ICD-10-CM | POA: Insufficient documentation

## 2020-05-01 DIAGNOSIS — L97811 Non-pressure chronic ulcer of other part of right lower leg limited to breakdown of skin: Secondary | ICD-10-CM | POA: Diagnosis not present

## 2020-05-01 DIAGNOSIS — L89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, stage 4: Secondary | ICD-10-CM | POA: Insufficient documentation

## 2020-05-01 DIAGNOSIS — Z8619 Personal history of other infectious and parasitic diseases: Secondary | ICD-10-CM | POA: Diagnosis not present

## 2020-05-01 DIAGNOSIS — F0390 Unspecified dementia without behavioral disturbance: Secondary | ICD-10-CM | POA: Insufficient documentation

## 2020-05-01 DIAGNOSIS — G894 Chronic pain syndrome: Secondary | ICD-10-CM | POA: Insufficient documentation

## 2020-05-01 DIAGNOSIS — S80811D Abrasion, right lower leg, subsequent encounter: Secondary | ICD-10-CM | POA: Diagnosis not present

## 2020-05-02 NOTE — Progress Notes (Signed)
KAYLYNE, AXTON ([id number]) Visit Report for 05/01/2020 Arrival Information Details Patient Name: Date of Service: TIARNA, KOPPEN 05/01/2020 10:45 A M Medical Record Number: [id number] Patient Account Number: [id number] Date of Birth/Sex: Treating RN: 1951-01-08 (70 y.o. [name], [name] Primary Care Eloisa Chokshi: [name] Other Clinician: Referring Teodoro Jeffreys: Treating Cissy Galbreath/Extender: Tommy Rainwater in Treatment: 74 Visit Information History Since Last Visit Added or deleted any medications: No Patient Arrived: Wheel Chair Any new allergies or adverse reactions: No Arrival Time: 11:21 Had a fall or experienced change in No Accompanied By: husband activities of daily living that may affect Transfer Assistance: EasyPivot Patient Lift risk of falls: Patient Identification Verified: Yes Signs or symptoms of abuse/neglect since last visito No Secondary Verification Process Completed: Yes Hospitalized since last visit: No Patient Requires Transmission-Based Precautions: No Implantable device outside of the clinic excluding No Patient Has Alerts: No cellular tissue based products placed in the center since last visit: Has Dressing in Place as Prescribed: Yes Pain Present Now: No Electronic Signature(s) Signed: 05/01/2020 11:41:47 AM By: Sandre Kitty Entered By: Sandre Kitty on 05/01/2020 11:21:42 -------------------------------------------------------------------------------- Encounter Discharge Information Details Patient Name: Date of Service: [name] 05/01/2020 10:45 A M Medical Record Number: [id number] Patient Account Number: [id number] Date of Birth/Sex: Treating RN: November 18, 1950 (70 y.o. [name], [name] Primary Care Viraj Liby: [name] Other Clinician: Referring Bert Givans: Treating Lillie Portner/Extender: Tommy Rainwater in Treatment: 69 Encounter Discharge Information Items Post Procedure Vitals Discharge Condition:  Stable Temperature (F): 97.7 Ambulatory Status: Wheelchair Pulse (bpm): 86 Discharge Destination: Home Respiratory Rate (breaths/min): 18 Transportation: Private Auto Blood Pressure (mmHg): 132/76 Accompanied By: husband Schedule Follow-up Appointment: Yes Clinical Summary of Care: Electronic Signature(s) Signed: 05/01/2020 6:32:06 PM By: Deon Pilling Entered By: Deon Pilling on 05/01/2020 17:47:06 -------------------------------------------------------------------------------- Lower Extremity Assessment Details Patient Name: Date of Service: DOLCE, SYLVIA 05/01/2020 10:45 A M Medical Record Number: [id number] Patient Account Number: [id number] Date of Birth/Sex: Treating RN: [birth year] (70 y.o. [name], [name] Primary Care Ashlin Kreps: [name] Other Clinician: Referring Thresia Ramanathan: Treating Manasvi Dickard/Extender: Tommy Rainwater in Treatment: 69 Edema Assessment Assessed: Shirlyn Goltz: No] Patrice Paradise: Yes] Edema: [Left: N] [Right: o] Calf Left: Right: Point of Measurement: From Medial Instep 31.5 cm Ankle Left: Right: Point of Measurement: From Medial Instep 21 cm Electronic Signature(s) Signed: 05/01/2020 6:32:06 PM By: Deon Pilling Entered By: Deon Pilling on 05/01/2020 11:31:03 -------------------------------------------------------------------------------- Multi Wound Chart Details Patient Name: Date of Service: [name] 05/01/2020 10:45 A M Medical Record Number: [id number] Patient Account Number: [id number] Date of Birth/Sex: Treating RN: 26-Jul-1950 (70 y.o. [name], [name] Primary Care Beckham Buxbaum: [name] Other Clinician: Referring Celie Desrochers: Treating Tyrek Lawhorn/Extender: Tommy Rainwater in Treatment: 69 Vital Signs Height(in): 62 Pulse(bpm): 64 Weight(lbs): 185 Blood Pressure(mmHg): 132/76 Body Mass Index(BMI): 33 Temperature(F): 97.7 Respiratory Rate(breaths/min): 18 Photos: [12:No Photos Left Ischial  Tuberosity] [13:No Photos Right, Distal, Anterior Lower Leg] [N/A:N/A N/A] Wound Location: [12:Pressure Injury] [13:Trauma] [N/A:N/A] Wounding Event: [12:Pressure Ulcer] [13:Diabetic Wound/Ulcer of the Lower] [N/A:N/A] Primary Etiology: [12:Anemia, Hypertension, Type II] [13:Extremity Anemia, Hypertension, Type II] [N/A:N/A] Comorbid History: [12:Diabetes, Osteoarthritis, Dementia, Paraplegia, Received Radiation 09/06/2018] [13:Diabetes, Osteoarthritis, Dementia, Paraplegia, Received Radiation 03/23/2020] [N/A:N/A] Date Acquired: [12:69] [13:5] [N/A:N/A] Weeks of Treatment: [12:Open] [13:Open] [N/A:N/A] Wound Status: [12:2.5x1x1] [13:3.4x0.6x0.1] [N/A:N/A] Measurements L x W x D (cm) [12:1.963] [13:1.602] [N/A:N/A] A (cm) : rea [12:1.963] [13:0.16] [N/A:N/A] Volume (cm) : [12:-731.80%] [13:16.70%] [N/A:N/A] % Reduction in Area: [12:-825.90%] [  13:16.70%] [N/A:N/A] % Reduction in Volume: [12:Category/Stage IV] [13:Grade 2] [N/A:N/A] Classification: [12:Medium] [13:Medium] [N/A:N/A] Exudate A mount: [12:Serosanguineous] [13:Serosanguineous] [N/A:N/A] Exudate Type: [12:red, brown] [13:red, brown] [N/A:N/A] Exudate Color: [12:Well defined, not attached] [13:Distinct, outline attached] [N/A:N/A] Wound Margin: [12:Large (67-100%)] [13:Small (1-33%)] [N/A:N/A] Granulation A mount: [12:Red, Pink] [13:Red, Pink] [N/A:N/A] Granulation Quality: [12:None Present (0%)] [13:Large (67-100%)] [N/A:N/A] Necrotic A mount: [12:N/A] [13:Eschar, Adherent Slough] [N/A:N/A] Necrotic Tissue: [12:Fat Layer (Subcutaneous Tissue): Yes Fat Layer (Subcutaneous Tissue): Yes N/A] Exposed Structures: [12:Fascia: No Tendon: No Muscle: No Joint: No Bone: No Small (1-33%)] [13:Fascia: No Tendon: No Muscle: No Joint: No Bone: No None] [N/A:N/A] Epithelialization: [12:N/A] [13:Debridement - Excisional] [N/A:N/A] Debridement: Pre-procedure Verification/Time Out N/A [13:11:49] [N/A:N/A] Taken: [12:N/A] [13:Lidocaine]  [N/A:N/A] Pain Control: [12:N/A] [13:Subcutaneous] [N/A:N/A] Tissue Debrided: [12:N/A] [13:Skin/Subcutaneous Tissue] [N/A:N/A] Level: [12:N/A] [13:2.04] [N/A:N/A] Debridement A (sq cm): [12:rea N/A] [13:Curette] [N/A:N/A] Instrument: [12:N/A] [13:Minimum] [N/A:N/A] Bleeding: [12:N/A] [13:Pressure] [N/A:N/A] Hemostasis A chieved: [12:N/A] [13:0] [N/A:N/A] Procedural Pain: [12:N/A] [13:0] [N/A:N/A] Post Procedural Pain: [12:N/A] [13:Procedure was tolerated well] [N/A:N/A] Debridement Treatment Response: [12:N/A] [13:3.4x0.6x0.1] [N/A:N/A] Post Debridement Measurements L x W x D (cm) [12:N/A] [13:0.16] [N/A:N/A] Post Debridement Volume: (cm) [12:N/A] [13:Debridement] [N/A:N/A] Treatment Notes Electronic Signature(s) Signed: 05/01/2020 5:14:25 PM By: Linton Ham MD Signed: 05/02/2020 5:56:51 PM By: Rhae Hammock RN Entered By: Linton Ham on 05/01/2020 12:16:45 -------------------------------------------------------------------------------- Multi-Disciplinary Care Plan Details Patient Name: Date of Service: [name], [name]. 05/01/2020 10:45 A M Medical Record Number: [id number] Patient Account Number: [id number] Date of Birth/Sex: Treating RN: 27-Sep-1950 (70 y.o. [name], [name] Primary Care Statia Burdick: [name] Other Clinician: Referring Ishan Sanroman: Treating Anant Agard/Extender: Tommy Rainwater in Treatment: 69 Active Inactive Wound/Skin Impairment Nursing Diagnoses: Knowledge deficit related to ulceration/compromised skin integrity Goals: Patient/caregiver will verbalize understanding of skin care regimen Date Initiated: 12/30/2018 Target Resolution Date: 04/10/2020 Goal Status: Active Interventions: Assess patient/caregiver ability to obtain necessary supplies Assess patient/caregiver ability to perform ulcer/skin care regimen upon admission and as needed Assess ulceration(s) every visit Provide education on ulcer and skin care Treatment  Activities: Skin care regimen initiated : 12/30/2018 Topical wound management initiated : 12/30/2018 Notes: Electronic Signature(s) Signed: 05/02/2020 5:56:51 PM By: Rhae Hammock RN Entered By: Rhae Hammock on 05/01/2020 18:16:50 -------------------------------------------------------------------------------- Pain Assessment Details Patient Name: Date of Service: [name] 05/01/2020 10:45 A M Medical Record Number: [id number] Patient Account Number: [id number] Date of Birth/Sex: Treating RN: 1950-07-16 (70 y.o. [name], [name] Primary Care Zulema Pulaski: [name] Other Clinician: Referring Grant Henkes: Treating Grabiela Wohlford/Extender: Tommy Rainwater in Treatment: 69 Active Problems Location of Pain Severity and Description of Pain Patient Has Paino No Site Locations Pain Management and Medication Current Pain Management: Electronic Signature(s) Signed: 05/01/2020 11:41:47 AM By: Sandre Kitty Signed: 05/02/2020 5:56:51 PM By: Rhae Hammock RN Entered By: Sandre Kitty on 05/01/2020 11:22:11 -------------------------------------------------------------------------------- Patient/Caregiver Education Details Patient Name: Date of Service: [name] 1/25/2022andnbsp10:45 A M Medical Record Number: [id number] Patient Account Number: [id number] Date of Birth/Gender: Treating RN: 04/26/1950 (70 y.o. [name], [name] Primary Care Physician: [name] Other Clinician: Referring Physician: Treating Physician/Extender: Tommy Rainwater in Treatment: 62 Education Assessment Education Provided To: Patient Education Topics Provided Wound/Skin Impairment: Handouts: Caring for Your Ulcer Methods: Explain/Verbal Responses: State content correctly Electronic Signature(s) Signed: 05/02/2020 5:56:51 PM By: Rhae Hammock RN Entered By: Rhae Hammock on 05/01/2020  18:19:08 -------------------------------------------------------------------------------- Wound Assessment Details Patient Name: Date of Service: [name], [name]. 05/01/2020 10:45 A M Medical  Record Number: [id number] Patient Account Number: [id number] Date of Birth/Sex: Treating RN: [birth year] (70 y.o. [name], [name] Primary Care Eldo Umanzor: [name] Other Clinician: Referring Arnita Koons: Treating Jaquann Guarisco/Extender: Tommy Rainwater in Treatment: 69 Wound Status Wound Number: 12 Primary Pressure Ulcer Etiology: Wound Location: Left Ischial Tuberosity Wound Open Wounding Event: Pressure Injury Status: Date Acquired: 09/06/2018 Comorbid Anemia, Hypertension, Type II Diabetes, Osteoarthritis, Weeks Of Treatment: 69 History: Dementia, Paraplegia, Received Radiation Clustered Wound: No Wound Measurements Length: (cm) 2.5 Width: (cm) 1 Depth: (cm) 1 Area: (cm) 1.963 Volume: (cm) 1.963 % Reduction in Area: -731.8% % Reduction in Volume: -825.9% Epithelialization: Small (1-33%) Tunneling: No Undermining: No Wound Description Classification: Category/Stage IV Wound Margin: Well defined, not attached Exudate Amount: Medium Exudate Type: Serosanguineous Exudate Color: red, brown Foul Odor After Cleansing: No Slough/Fibrino No Wound Bed Granulation Amount: Large (67-100%) Exposed Structure Granulation Quality: Red, Pink Fascia Exposed: No Necrotic Amount: None Present (0%) Fat Layer (Subcutaneous Tissue) Exposed: Yes Tendon Exposed: No Muscle Exposed: No Joint Exposed: No Bone Exposed: No Treatment Notes Wound #12 (Ischial Tuberosity) Wound Laterality: Left Cleanser Soap and Water Discharge Instruction: May shower and wash wound with dial antibacterial soap and water prior to dressing change. Wound Cleanser Discharge Instruction: Cleanse the wound with wound cleanser prior to applying a clean dressing using gauze sponges, not tissue or cotton  balls. Peri-Wound Care Skin Prep Discharge Instruction: Use skin prep as directed Topical Primary Dressing Promogran Prisma Matrix, 4.34 (sq in) (silver collagen) Discharge Instruction: Moisten collagen with saline or hydrogel Secondary Dressing Optifoam Non-Adhesive Dressing, 4x4 in Discharge Instruction: Apply over primary dressing as directed. Secured With Compression Wrap Compression Stockings Environmental education officer) Signed: 05/01/2020 6:32:06 PM By: Deon Pilling Signed: 05/02/2020 5:56:51 PM By: Rhae Hammock RN Entered By: Deon Pilling on 05/01/2020 11:31:32 -------------------------------------------------------------------------------- Wound Assessment Details Patient Name: Date of Service: [name] 05/01/2020 10:45 A M Medical Record Number: [id number] Patient Account Number: [id number] Date of Birth/Sex: Treating RN: 1951-02-17 (70 y.o. [name], [name] Primary Care Rett Stehlik: [name] Other Clinician: Referring Joao Mccurdy: Treating Oluwasemilore Pascuzzi/Extender: Tommy Rainwater in Treatment: 69 Wound Status Wound Number: 13 Primary Diabetic Wound/Ulcer of the Lower Extremity Etiology: Wound Location: Right, Distal, Anterior Lower Leg Wound Open Wounding Event: Trauma Status: Date Acquired: 03/23/2020 Comorbid Anemia, Hypertension, Type II Diabetes, Osteoarthritis, Weeks Of Treatment: 5 History: Dementia, Paraplegia, Received Radiation Clustered Wound: No Wound Measurements Length: (cm) 3.4 Width: (cm) 0.6 Depth: (cm) 0.1 Area: (cm) 1.602 Volume: (cm) 0.16 % Reduction in Area: 16.7% % Reduction in Volume: 16.7% Epithelialization: None Tunneling: No Undermining: No Wound Description Classification: Grade 2 Wound Margin: Distinct, outline attached Exudate Amount: Medium Exudate Type: Serosanguineous Exudate Color: red, brown Foul Odor After Cleansing: No Slough/Fibrino Yes Wound Bed Granulation Amount: Small (1-33%)  Exposed Structure Granulation Quality: Red, Pink Fascia Exposed: No Necrotic Amount: Large (67-100%) Fat Layer (Subcutaneous Tissue) Exposed: Yes Necrotic Quality: Eschar, Adherent Slough Tendon Exposed: No Muscle Exposed: No Joint Exposed: No Bone Exposed: No Treatment Notes Wound #13 (Lower Leg) Wound Laterality: Right, Anterior, Distal Cleanser Soap and Water Discharge Instruction: May shower and wash wound with dial antibacterial soap and water prior to dressing change. Wound Cleanser Discharge Instruction: Cleanse the wound with wound cleanser prior to applying a clean dressing using gauze sponges, not tissue or cotton balls. Peri-Wound Care Skin Prep Discharge Instruction: Use skin prep as directed Topical Primary Dressing Maxorb Extra Calcium Alginate Dressing, 4x4 in Discharge Instruction: Apply calcium alginate  to wound bed as instructed Secondary Dressing Optifoam Non-Adhesive Dressing, 4x4 in Discharge Instruction: Apply over primary dressing as directed. Secured With Compression Wrap Compression Stockings Environmental education officer) Signed: 05/01/2020 6:32:06 PM By: Deon Pilling Signed: 05/02/2020 5:56:51 PM By: Rhae Hammock RN Entered By: Deon Pilling on 05/01/2020 11:31:54 -------------------------------------------------------------------------------- Vitals Details Patient Name: Date of Service: [name], [name]. 05/01/2020 10:45 A M Medical Record Number: [id number] Patient Account Number: [id number] Date of Birth/Sex: Treating RN: 1950-12-31 (70 y.o. [name], [name] Primary Care Reyann Troop: [name] Other Clinician: Referring Macie Baum: Treating Shine Scrogham/Extender: Tommy Rainwater in Treatment: 69 Vital Signs Time Taken: 11:21 Temperature (F): 97.7 Height (in): 63 Pulse (bpm): 86 Weight (lbs): 185 Respiratory Rate (breaths/min): 18 Body Mass Index (BMI): 32.8 Blood Pressure (mmHg): 132/76 Reference Range: 80 -  120 mg / dl Electronic Signature(s) Signed: 05/01/2020 11:41:47 AM By: Sandre Kitty Entered By: Sandre Kitty on 05/01/2020 11:22:03

## 2020-05-02 NOTE — Progress Notes (Signed)
IVIS, NICOLSON ([id number]) Visit Report for 05/01/2020 Debridement Details Patient Name: Date of Service: SOLARA, GOODCHILD 05/01/2020 10:45 A M Medical Record Number: [id number] Patient Account Number: [id number] Date of Birth/Sex: Treating RN: 1950-10-08 (70 y.o. [name], [name] Primary Care Provider: [name] Other Clinician: Referring Provider: Treating Provider/Extender: Tommy Rainwater in Treatment: 70 Debridement Performed for Assessment: Wound #13 Right,Distal,Anterior Lower Leg Performed By: Physician Ricard Dillon., MD Debridement Type: Debridement Severity of Tissue Pre Debridement: Fat layer exposed Level of Consciousness (Pre-procedure): Awake and Alert Pre-procedure Verification/Time Out Yes - 11:49 Taken: Start Time: 11:49 Pain Control: Lidocaine T Area Debrided (L x W): otal 3.4 (cm) x 0.6 (cm) = 2.04 (cm) Tissue and other material debrided: Viable, Non-Viable, Callus, Skin: Dermis Level: Skin/Dermis Debridement Description: Selective/Open Wound Instrument: Curette Bleeding: Minimum Hemostasis Achieved: Pressure End Time: 11:51 Procedural Pain: 0 Post Procedural Pain: 0 Response to Treatment: Procedure was tolerated well Level of Consciousness (Post- Awake and Alert procedure): Post Debridement Measurements of Total Wound Length: (cm) 3.4 Width: (cm) 0.6 Depth: (cm) 0.1 Volume: (cm) 0.16 Character of Wound/Ulcer Post Debridement: Improved Severity of Tissue Post Debridement: Fat layer exposed Post Procedure Diagnosis Same as Pre-procedure Electronic Signature(s) Signed: 05/01/2020 5:14:25 PM By: Linton Ham MD Signed: 05/02/2020 5:56:51 PM By: Rhae Hammock RN Entered By: Linton Ham on 05/01/2020 12:17:00 -------------------------------------------------------------------------------- HPI Details Patient Name: Date of Service: [name] 05/01/2020 10:45 A M Medical Record Number:  [id number] Patient Account Number: [id number] Date of Birth/Sex: Treating RN: Feb 13, 1951 (70 y.o. [name], [name] Primary Care Provider: [name] Other Clinician: Referring Provider: Treating Provider/Extender: Tommy Rainwater in Treatment: 70 History of Present Illness Location: open ulceration of the left gluteal area, left heel and right ankle for about 5 months. Quality: Patient reports No Pain. Severity: Patient states wound(s) are getting worse. Duration: Patient has had the wound for > 5 months prior to seeking treatment at the wound center Context: The wound occurred when the patient has been paraplegic for about 3 years. Modifying Factors: Wound improving due to current treatment. ssociated Signs and Symptoms: Patient reports having foul odor. A HPI Description: this 70 year old patient who is known to have hypertension, hypothyroidism, breast cancer, chronic pain syndrome, paraplegia was noted to have a left gluteal decubitus ulcer and was brought into the hospital. During the course of her hospitalization she was debrided in the operating room by Dr. Leland Johns and had all the wounds sharply debrided. The debridement was done for the left ischial wound, the left heel wound and the right ankle wound. Bone cultures were taken at that time but were negative but clinically she was treated for osteomyelitis because of the probing down to bone and open exposed bone. Home health has been giving her antibioticss which include vancomycin and Zosyn. The patient was a smoker until about 3 weeks ago and used to smoke about 10 cigarettes a day for a long while. 12/13/2014 - details of her operative note from 11/03/2014 were reviewed -- PROCEDURE: 1. Excisional debridement skin, subcutaneous, muscle left ischium 35 cm2 2. Excisional debridement skin, subcutaneous tissue left heel 27 cm2 3. Excisional debridement right ankle skin, subcutaneous, bone 30 cm2 01/24/2015 -- she has  some issues with her wheelchair cushion but other than that is doing very well and has received Podus boots for her feet. 02/14/2015 -- she was using her old offloading boots and this seemed to have caused her a new pressure  ulcer on the left posterior heel near the superior part just below the Achilles tendon. 03/07/2015 -- she has a new ulceration just to the left of the midline on her sacral region more on the left buttock and this has been there for about a week. 08/22/2015 -- was recently admitted to hospital between May 5 and 08/13/2015, with sepsis and leukocytosis due to a UTI. she was treated for a sepsis complicating Escherichia coli UTI and kidney stones. She also had metabolic and careful up at the secondary to pyelonephritis. He received broad-spectrum antibiotics initially and then received Macrobid as per urology. She was sent home on nitrofurantoin. during her admission she had a CT scan which showed exposed left ischial tuberosity without evidence of osteolysis. 09/12/2015-- the patient is having some issues with her air mattress and would like to get a opinion from medical modalities. 10/10/2015 -- the issue with her air mattress has not yet been sorted out and the new problem seems to be a lot of odor from the wound VAC. 11/27/2015 -- the patient was admitted to the hospital between July 23 and 10/31/2015. Her problems were sepsis, osteomyelitis of the pelvic bone and acute pyelonephritis. CT of the abdomen and pelvis was consistent with a left-sided pyelonephritis with hydronephrosis and also just showed new sclerosis of the posterior portion of the left anterior pubic ramus suggestive of periosteal reaction consistent with osteomyelitis. She was treated for the osteomyelitis with infectious disease consult recommending 6 weeks of IV antibiotics including vancomycin and Rocephin and the antibiotics were to go on until 12/10/2015. He was seen by Dr. Iran Planas plastic surgery and Dr.  Linus Salmons of infectious disease. She had a suprapubic catheter placed during the admission. CT scan done on 10/28/2015 showed specifically -- New sclerosis of the posterior portion of the left inferior pubic ramus with aggressive periosteal reaction, consistent with osteomyelitis, with adjacent soft tissue gas compatible with previously described decubitus ulcer. 12/12/2015 -- she was recently seen by Dr. Linus Salmons, who noted good improvement and CRP and ESR compared to before and he has stopped her antibiotic as per plans to finish on September 4. The patient was encouraged to continue with wound care and consider hyperbaric oxygen therapy. Today she tells me that she has consented to undergo hyperbaric oxygen therapy and we can start the paperwork. 01/02/2016 -- her PCP had gained about 3 years but she still persists in having problems during hyperbaric oxygen therapy with some discomfort in the ears. 01/09/16; pressure area with underlying osteomyelitis in the left buttock. Wound bed itself has some slight amount of grayish surface slough however I do not think any debridement was necessary. There is no exposed bone soft tissue appears stable. She is using a wound VAC 01/16/16; back for weekly wound review in conjunction with HBO. She has a deep wound over the left initial tuberosity previously treated with 6 weeks of IV antibiotics for osteomyelitis. Wound bed looks reasonably healthy although the base of this is still precariously close to bone. She has been using a wound VAC. 01/23/2016 -- she has completed her course of antibiotics and this week the only new thing is her right great toe nail was avulsed and she has got an open wound over the nailbed. 01/31/16 she has completed her course of antibiotics. Her right great toenail avulsed last week and she's been using silver alginate for this as well. Still using a wound VAC to the substantial stage IV wound over the left ischial tuberosity 03/05/2016 --  the patient has had a opinion from the plastic surgery group at [facility] and details of this are not available yet but the patient's verbal report has been heard by me. Did not sound like there was any optimistic discussion regarding reconstruction and the net result would be to continue with the wound VAC application. I will await the official reports. Addendum: -- she was seen at [facility] by Dr. Tressa Busman. After a thorough review and from what I understand spending 45 minutes with the patient his assessment has been noted by me in detail and the management options were: 1. Continued pressure offloading and wound care versus operative procedures including wound excision 2. Soft tissue and bone sampling 3. If the wound gets larger wound closure would be done using a variety of plastic surgical techniques including but not limited to skin substitute, possible skin graft, local versus regional flaps, negative pressure dressing application. 4. He discussed with her details of flap surgery and the risks associated 5. He made a comment that since the patient was operated on by Dr. Leland Johns of [facility] plastic surgery unit in [place] the patient may continue to follow-up there for further evaluation for surgical flap closure in the future. 03/19/2016 -- the patient continues to be rather depressed and frustrated with her lack of rapid progress in healing this wound especially because she thought after hyperbaric oxygen therapy the wound would heal extremely fast. She now understands that was not the implied benefit on wound care which was the recommendation for hyperbaric oxygen therapy. I have had a lengthy discussion with the patient and her husband regarding her options: 1. Continue with collagen and wound VAC for the primary dressing and offloading and all supportive care. 2. See Dr. Iran Planas for possible placement of Acell or Integra in  the OR. 3. get a second opinion from a wound care center and surrounding regions/counties 05/07/2016 -- Note from Dr. Celedonio Miyamoto, who noted that the patient has declined flap surgery. She has discussed application of A cell, and try a few applications to see how the wound progresses. She is also recommended that we could apply products here in the wound center, like Oasis. during her preop workup it was found that her hemoglobin A1c was 11% and she has now been diagnosed as having diabetes mellitus and has been put on appropriate treatment by her PCP 05/28/2016 -- tells me her blood sugars have been doing well and she has an appointment to see her PCP in the next couple of weeks to check her hemoglobin A1c. Other than that she continues to do well. 06/25/2016 -- have not seen her back for the last month but she says her health has been about the same and she has an appointment to check the A1c next week 09/10/16 ---- was seen by Dr. Celedonio Miyamoto -- who applied Acell and saw her back in follow-up. She has recommended silver alginate to the wound every other day and cover with foam. If no significant drainage could transition to collagen every other day. She recommended discontinuing wound VAC. There were no plans to repeat application of Acell. The patient expressed that her husband could do the wound care as going to the Wound Ctr., would cost several $100 for each visit. 10/21/2016 -- her insurance company is getting her new mattress and she is pleased about that. Other than that she has been doing dressings with PolyMem Silver and has been doing very  well 02/18/2017 -- she has gone through several changes of her mattress and has not been pleased with any of them. The ventricles are still working on trying to fit her with the appropriate low air-loss mattress. She has a new wound on the gluteal area which is clearly separated from the original wound. 03/25/17-she is here in follow-up  evaluation for her left ischialpressure ulcer. She remains unsatisfied with her pressure mattress. She admits to sitting multiple hours a day, in the bed. We have discussed offloading options. The wound does not appear infected. Nutrition does not appear to be a concern. Will follow-up in 4 weeks, if wound continues to be stalled may consider x-ray to evaluate for refractory osteomyelitis. 04/21/17; this is a patient that I don't know all that well. She has a chronic wound which at one point had underlying osteomyelitis in the left ischial tuberosity. This is a stage IV pressure ulcer. Over the last 3 months she has a stage II wound inferiorly to the original wound. The last time she was here her dressing was changed to silver collagen although the patient's husband who changes the dressing said that the collagen stuck to the wound and remove skin from the superficial area therefore he switched back to [place]rens 05/13/17; this is a patient we've been following for a left ischial tuberosity wound which was stage IV at one point had underlying osteomyelitis. Over the last several months she's had a stage II wound just inferior and medial to the related to the wound. According to her husband he is using Endoform layer with collagen although this is not what I had last time. According to her husband they are using Elgie Congo with collagen although I don't quite know how that started. She was hospitalized from 1/20 through 04/30/16. This was related to a UTI. Her blood cultures were negative, urine culture showed multiple species. She did have a CT scan of the abdomen and pelvis which documented chronic osteomyelitis in the area of the wound inflammatory markers were unremarkable. She has had prior knowledge of osteomyelitis. It looks as though she received IV antibiotics in 2017 and was treated with a course of hyperbaric oxygen. 05/28/17; the wound over the left ischial tuberosity is deeper today and  abuts clearly on bone. Nursing intake reported drainage. I therefore culture of the wound. The more superficial area just below this looks about the same. They once again complained that there are mattress cover is not working although apparently [facility] is been noted to see this many times in the report is that the device is functional 06/18/17; the patient had a probing area on the left ischial tuberosity that was draining purulent fluid last time. This also clearly seemed to have open bone. Culture I did showed pansensitive pseudomonas including third generation cephalosporins. I treated this with cefdinir 300 twice a day for 10 days and things seem to have improved. She has a more superficial wound just underneath this area. Amazingly she has a new air mattress through [facility]. I think they gave this to her as a parking give. In any case this now works according to the patient may have something to do with why the areas are looking better. 07/09/17; the patient has a probing area in the left ischial tuberosity that still has some depth. However this is contracted in terms of the wound orifice although the depth is still roughly the same. There is no undermining. She also has the satellite wound  which is more superficial. This appears to have a healthy surface we've been using silver collagen 08/06/17; the patient's wound is over the left ischial tuberosity and a satellite lesion just underneath this. The original wound was actually a deep stage 4 wound. We have made good progress in 2 months and there is no longer exposed bone here. 09/03/17; left ischial tuberosity actually appears to be quite healthy. I think we are making progress. No debridement is required. There is no surrounding erythema 10/01/17 I follow this patient monthly for her left ischial tuberosity wound. There is 2 areas the original area and a satellite area. The satellite area looks a lot better there is no  surrounding erythema. Her husband relates that he is having trouble maintaining the dressing. This has to do with the soft tissue around it. He states he puts the collagen in but he cannot make sure that it stays in even with the ABD pads and tape that he is been using 10/29/17; patient arrives with a better looking noon today. Some of the satellite lesions have closed. using Prisma 11/26/17; the patient has a large cone-shaped area with the tip of the Cone deep within her buttock soft tissue. The walls of the Cone are epithelialized however the base is still open. The area at the base of this looks moist we've been using silver collagen. Will change to silver alginate 12/31/2017; the wound appears to have come in fairly nicely. Using silver alginate. There is no surrounding maceration or infection 01/28/18; there is still an open area here over the left initial tuberosity. Base of this however looks healthy. There is no surrounding infection 02/25/18; the area of its open is over the left ischial tuberosity. The base of this is where the wound is. This is a large inverted cone-shaped area with the wound at the tip. Dimensions of the wound at the tip are improved. There is a area of denuded skin about halfway towards the tip which her husband thinks may have happened today when he was bathing her. 04/20/17; the area is still open over the left initial tuberosity. 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 This is now completely closed over although it is closed over with a divot and skin over bone at the base of this. Continued aggressive offloading will be necessary. 12/30/2018 READMISSION This is a 71 year old woman with chronic paraplegia. I picked her up for her care from Dr. Con Memos in this clinic after he  departed. She had a stage IV pressure wound over the left ischial tuberosity. She was treated twice for her underlying osteomyelitis and this I believe firstly in 2016 and again in 2017. There were some plans at some point for flap closure of this however she was discovered to have uncontrolled diabetes and I do not think this was ever accomplished. She ultimately healed over in this clinic and was discharged in May. She has a large cone-shaped indentation with the tip of this going towards the left ischial tuberosity. It is not an easy area to examine but at that time I thought all of this was epithelialized. Apparently there was a reopening here shortly after she left the clinic last time. She was admitted to hospital at the end of June for Klebsiella bacteremia felt to be secondary to  UTI. A CT scan of the pelvis is listed below and there was initially some concern that she had underlying osteomyelitis although I believe she was seen by infectious disease and that was felt to be not the case: I do not see any new cultures or inflammatory markers IMPRESSION: 1. No CT evidence for acute intra-abdominal or pelvic abnormality. Large volume of stool throughout the colon. 2. Enlarged fatty liver with fat sparing near the gallbladder fossa 3. Cortical scarring right kidney. Bilateral intrarenal stones without hydronephrosis. Thick-walled urinary bladder decompressed by suprapubic catheter 4. Deep left decubitus ulcer with underlying left ischial changes suggesting osteomyelitis. Her husband has been using silver collagen in the wound. She has not been systemically unwell no fever chills eating and drinking well. They rigorously offload this wound only getting up in the wheelchair when she is going to appointments the rest of the time she is in bed. 10/8; wound measures larger and she now has exposed bone. We have been using silver alginate 11/12 still using silver alginate. The patient saw Dr.  Megan Salon of infectious disease. She was started on Augmentin 500 mg twice daily. She is due to follow-up with Dr. Megan Salon I believe next week. Lab work Dr. Megan Salon requested showed a sedimentation rate of 28 and CRP of 20 although her CRP 1 year ago was 18.8. Sedimentation rate 1 year ago was 11 basic metabolic panel showed a creatinine of 1.12 12/3; the patient followed up with Dr. Megan Salon yesterday. She is still on Augmentin twice daily. This was directed by Dr. Megan Salon. The patient's inflammatory markers have improved which is gratifying. Her C-reactive protein was repeated yesterday and follow-up booked with infectious disease in January. In addition I have been getting secure text messages I think from palliative care through [facility] [employer]. I think they were hoping to provide services to the patient in her home. They could not get a hold of the primary physician and so they reached out to me on 2 separate occasions. 12/17; patient last saw Dr. Megan Salon on 12/2. She is finishing up with Augmentin. Her C-reactive protein was 20 on 10/21, 10.1 on 11/19 and 17 on 12/2. The wound itself still has depth and undermining. We are using Santyl with the backing wet-to-dry 04/27/2019. The wound is gradually clearing up in terms of the surface although it is not filled in that much. Still abuts right against bone 2/4; patient with a deep pressure ulcer over the left ischial tuberosity. I thought she was going to follow-up with infectious disease to follow her inflammatory markers although the patient states that they stated that they did not need to see her unless we felt it was necessary. I will need to check their notes. In any case we ordered moistened silver collagen back with wet-to-dry to fill in the depth of the wound although apparently [facility] sent silver alginate which they have been using since they were here the last time. Is obviously not what we ordered. 2/25. Not much  change in this wound it is over the left ischial tuberosity recurrent wound. We have been using silver collagen with backing wet-to-dry. I think the wound is about the same. There is still some tunneling from about 10-12 o'clock over the ischial tuberosity itself 3/11; pressure ulcer over the left ischial tuberosity. Since she was last here the wound VAC was started and apparently going quite well. We are able to get the home health company that accepts [place] [employer] which is in  itself sometimes problematic. There is been improvements in the wound the tunneling seems to be better and is contracted nicely 4/8; 1 month follow-up. Since she was last here we have been using silver collagen under a wound VAC. Some minor contraction I think in wound volume. She is cared for diligently by her husband including pressure relief, incontinence management, nutritional support etc. 6/1; this is almost a 76-month follow-up. She is been using silver collagen under wound VAC. Circular area over the left ischial tuberosity. She has been using silver collagen under wound VAC 7/8; 1 month follow-up. Silver collagen under the VAC not really a lot of progress. Tissue at the base of the wound which is right against bone and the tissue next that this does not look completely viable. She is not currently on any antibiotics, she had underlying osteomyelitis I need to look this over 8/16; we are using silver collagen under wound VAC to the left ischial tuberosity wound. Comes in today with absolutely no change in surface area or depth. There is no exposed bone. I did look over her infectious disease notes as I said I would do last time. She last saw Dr. Megan Salon in December 2020. She completed 6 weeks of Augmentin. This was in response to a bone culture I did showing methicillin susceptible staph aureus and Enterococcus. She was supposed to come back to see Dr. Megan Salon at some point although they say that that appointment  was canceled unless I chose to recommend return. I think there was supposed to be follow-up with inflammatory markers but I cannot see that that was ever done. She has not been on antibiotics since 9/21; monthly follow-up. We received a call from home health nurse last evening to report green drainage coming out of the wound. Lab work I ordered last time showed a white count of 5.2 a sedimentation rate of 45 and a C-reactive protein of 25 however neither one of the 2 values are substantially different from her previous values in October 2020 or December 2020. Both are slightly higher but only marginally. Otherwise no new complaints from the patient or her husband 10/19; 1 month follow-up. PCR culture I did last time showed medium quantities of Pseudomonas lower quantities Klebsiella and Enterococcus faecalis group B strep and Peptostreptococcus. I gave her Augmentin for 2 weeks. I am not really sure of my choice of this I would not cover Pseudomonas. She is still having green drainage. Wound itself looks satisfactory there is not a lot of depth wound bed looks healthy 11/16; patient has completed the antibiotics still using gentamicin and silver alginate on the wound. There is improvement in the surface area 12/21; in general the area on the buttock looks somewhat better. Surface looks healthy although I do not know that there is been much improvement in the wound volume. We have been using silver alginate and Hydrofera Blue. Less drainage. In passing the husband showed me an abrasion injury on the left anterior tibia. Covered in necrotic surface. He has noticed this for about a week and has been putting silver collagen on it. He is completely uncertain about how this happened 1/25; monthly follow-up. The area on the left buttock is about the same. This does not go to bone but a fairly deep wound surface of the wound is of questionable viability. The abrasion injury that they showed me last time  apparently was closed out by home health because they thought it was healed but certainly is not although it  is just about healed. As a result they haven't been applying anything to this area Finally I did discuss with the patient and her husband the idea of an advanced treatment product to try and get a proper base to this wound I was thinking of Puraply however actually the patient points out that her co-pay for coming to visit [place] the facility, charge would be unaffordable if they have to, on a weekly basis Electronic Signature(s) Signed: 05/01/2020 5:14:25 PM By: Linton Ham MD Entered By: Linton Ham on 05/01/2020 12:19:04 -------------------------------------------------------------------------------- Physical Exam Details Patient Name: Date of Service: [name] 05/01/2020 10:45 A M Medical Record Number: [id number] Patient Account Number: [id number] Date of Birth/Sex: Treating RN: [birth year] (70 y.o. [name], [name] Primary Care Provider: [name] Other Clinician: Referring Provider: Treating Provider/Extender: Tommy Rainwater in Treatment: 70 Constitutional Sitting or standing Blood Pressure is within target range for patient.. Pulse regular and within target range for patient.Marland Kitchen Respirations regular, non-labored and within target range.. Temperature is normal and within the target range for the patient.Marland Kitchen Appears in no distress. Notes Wound exam; left ischial tuberosity. Surface of this does not look too bad there is no exposed bone however this is a deep punched-out area. Granulation tissue does not appear to be filling in on the right anterior tibial area mid aspect there is still some callus and dry skin over the surface I remove this with a #3 curette minimal open wounds but this is certainly not closed Electronic Signature(s) Signed: 05/01/2020 5:14:25 PM By: Linton Ham MD Entered By: Linton Ham on 05/01/2020  12:20:39 -------------------------------------------------------------------------------- Physician Orders Details Patient Name: Date of Service: [name] 05/01/2020 10:45 A M Medical Record Number: [id number] Patient Account Number: [id number] Date of Birth/Sex: Treating RN: [birth year] (70 y.o. [name], [name] Primary Care Provider: [name] Other Clinician: Referring Provider: Treating Provider/Extender: Tommy Rainwater in Treatment: 66 Verbal / Phone Orders: No Diagnosis Coding Follow-up Appointments Return appointment in 1 month. Bathing/ Shower/ Hygiene May shower and wash wound with soap and water. - with dressingchanges Edema Control - Lymphedema / SCD / Other Elevate legs to the level of the heart or above for 30 minutes daily and/or when sitting, a frequency of: Avoid standing for long periods of time. Off-Loading Turn and reposition every 2 hours Marquez wound care orders this week; continue Home Health for wound care. May utilize formulary equivalent dressing for wound treatment orders unless otherwise specified. Wound Treatment Wound #12 - Ischial Tuberosity Wound Laterality: Left Cleanser: Soap and Water Every Other Day/30 Days Discharge Instructions: May shower and wash wound with dial antibacterial soap and water prior to dressing change. Cleanser: Wound Cleanser [facility]) Every Other Day/30 Days Discharge Instructions: Cleanse the wound with wound cleanser prior to applying a clean dressing using gauze sponges, not tissue or cotton balls. Peri-Wound Care: Skin Prep [facility]) Every Other Day/30 Days Discharge Instructions: Use skin prep as directed Prim Dressing: Promogran Prisma Matrix, 4.34 (sq in) (silver collagen) (Home Health) Every Other Day/30 Days ary Discharge Instructions: Moisten collagen with saline or hydrogel Secondary Dressing: Optifoam Non-Adhesive Dressing, 4x4 in [facility]) Every Other Day/30  Days Discharge Instructions: Apply over primary dressing as directed. Wound #13 - Lower Leg Wound Laterality: Right, Anterior, Distal Cleanser: Soap and Water Every Other Day/30 Days Discharge Instructions: May shower and wash wound with dial antibacterial soap and water prior to dressing change. Cleanser: Wound Cleanser (  Home Health) Every Other Day/30 Days Discharge Instructions: Cleanse the wound with wound cleanser prior to applying a clean dressing using gauze sponges, not tissue or cotton balls. Peri-Wound Care: Skin Prep [facility]) Every Other Day/30 Days Discharge Instructions: Use skin prep as directed Prim Dressing: Maxorb Extra Calcium Alginate Dressing, 4x4 in [facility]) Every Other Day/30 Days ary Discharge Instructions: Apply calcium alginate to wound bed as instructed Secondary Dressing: Optifoam Non-Adhesive Dressing, 4x4 in [facility]) Every Other Day/30 Days Discharge Instructions: Apply over primary dressing as directed. Electronic Signature(s) Signed: 05/01/2020 5:14:25 PM By: Linton Ham MD Signed: 05/02/2020 5:56:51 PM By: Rhae Hammock RN Entered By: Rhae Hammock on 05/01/2020 11:52:41 -------------------------------------------------------------------------------- Problem List Details Patient Name: Date of Service: [name] 05/01/2020 10:45 A M Medical Record Number: [id number] Patient Account Number: [id number] Date of Birth/Sex: Treating RN: 03-Sep-1950 (70 y.o. 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, stage 4 12/30/2018 No Yes E11.622 Type 2 diabetes mellitus with other skin ulcer 12/30/2018 No Yes S80.811D Abrasion, right lower leg, subsequent encounter 03/27/2020 No Yes L97.811  Non-pressure chronic ulcer of other part of right lower leg limited to breakdown 03/27/2020 No Yes of skin Inactive Problems ICD-10 Code Description Active Date Inactive Date G82.20 Paraplegia, unspecified 12/30/2018 12/30/2018 M86.68 Other chronic osteomyelitis, other site 02/17/2019 02/17/2019 Resolved Problems Electronic Signature(s) Signed: 05/01/2020 5:14:25 PM By: Linton Ham MD Entered By: Linton Ham on 05/01/2020 12:16:37 -------------------------------------------------------------------------------- Progress Note Details Patient Name: Date of Service: [name] 05/01/2020 10:45 A M Medical Record Number: [id number] Patient Account Number: [id number] Date of Birth/Sex: Treating RN: April 23, 1950 (70 y.o. [name], [name] Primary Care Provider: [name] Other Clinician: Referring Provider: Treating Provider/Extender: Tommy Rainwater in Treatment: 72 Subjective History of Present Illness (HPI) The following HPI elements were documented for the patient's wound: Location: open ulceration of the left gluteal area, left heel and right ankle for about 5 months. Quality: Patient reports No Pain. Severity: Patient states wound(s) are getting worse. Duration: Patient has had the wound for > 5 months prior to seeking treatment at the wound center Context: The wound occurred when the patient has been paraplegic for about 3 years. Modifying Factors: Wound improving due to current treatment. Associated Signs and Symptoms: Patient reports having foul odor. this 70 year old patient who is known to have hypertension, hypothyroidism, breast cancer, chronic pain syndrome, paraplegia was noted to have a left gluteal decubitus ulcer and was brought into the hospital. During the course of her hospitalization she was debrided in the operating room by Dr. Leland Johns and had all the wounds sharply debrided. The debridement was done for the left ischial wound, the  left heel wound and the right ankle wound. Bone cultures were taken at that time but were negative but clinically she was treated for osteomyelitis because of the probing down to bone and open exposed bone. Home health has been giving her antibioticss which include vancomycin and Zosyn. The patient was a smoker until about 3 weeks ago and used to smoke about 10 cigarettes a day for a long while. 12/13/2014 - details of her operative note from 11/03/2014 were reviewed -- PROCEDURE: 1. Excisional debridement skin, subcutaneous, muscle left ischium 35 cm2 2. Excisional debridement skin, subcutaneous tissue left heel 27 cm2 3. Excisional debridement right  ankle skin, subcutaneous, bone 30 cm2 01/24/2015 -- she has some issues with her wheelchair cushion but other than that is doing very well and has received Podus boots for her feet. 02/14/2015 -- she was using her old offloading boots and this seemed to have caused her a new pressure ulcer on the left posterior heel near the superior part just below the Achilles tendon. 03/07/2015 -- she has a new ulceration just to the left of the midline on her sacral region more on the left buttock and this has been there for about a week. 08/22/2015 -- was recently admitted to hospital between May 5 and 08/13/2015, with sepsis and leukocytosis due to a UTI. she was treated for a sepsis complicating Escherichia coli UTI and kidney stones. She also had metabolic and careful up at the secondary to pyelonephritis. He received broad-spectrum antibiotics initially and then received Macrobid as per urology. She was sent home on nitrofurantoin. during her admission she had a CT scan which showed exposed left ischial tuberosity without evidence of osteolysis. 09/12/2015-- the patient is having some issues with her air mattress and would like to get a opinion from medical modalities. 10/10/2015 -- the issue with her air mattress has not yet been sorted out and the new problem  seems to be a lot of odor from the wound VAC. 11/27/2015 -- the patient was admitted to the hospital between July 23 and 10/31/2015. Her problems were sepsis, osteomyelitis of the pelvic bone and acute pyelonephritis. CT of the abdomen and pelvis was consistent with a left-sided pyelonephritis with hydronephrosis and also just showed new sclerosis of the posterior portion of the left anterior pubic ramus suggestive of periosteal reaction consistent with osteomyelitis. She was treated for the osteomyelitis with infectious disease consult recommending 6 weeks of IV antibiotics including vancomycin and Rocephin and the antibiotics were to go on until 12/10/2015. He was seen by Dr. Iran Planas plastic surgery and Dr. Linus Salmons of infectious disease. She had a suprapubic catheter placed during the admission. CT scan done on 10/28/2015 showed specifically -- New sclerosis of the posterior portion of the left inferior pubic ramus with aggressive periosteal reaction, consistent with osteomyelitis, with adjacent soft tissue gas compatible with previously described decubitus ulcer. 12/12/2015 -- she was recently seen by Dr. Linus Salmons, who noted good improvement and CRP and ESR compared to before and he has stopped her antibiotic as per plans to finish on September 4. The patient was encouraged to continue with wound care and consider hyperbaric oxygen therapy. Today she tells me that she has consented to undergo hyperbaric oxygen therapy and we can start the paperwork. 01/02/2016 -- her PCP had gained about 3 years but she still persists in having problems during hyperbaric oxygen therapy with some discomfort in the ears. 01/09/16; pressure area with underlying osteomyelitis in the left buttock. Wound bed itself has some slight amount of grayish surface slough however I do not think any debridement was necessary. There is no exposed bone soft tissue appears stable. She is using a wound VAC 01/16/16; back for weekly wound  review in conjunction with HBO. She has a deep wound over the left initial tuberosity previously treated with 6 weeks of IV antibiotics for osteomyelitis. Wound bed looks reasonably healthy although the base of this is still precariously close to bone. She has been using a wound VAC. 01/23/2016 -- she has completed her course of antibiotics and this week the only new thing is her right great toe nail was avulsed and  she has got an open wound over the nailbed. 01/31/16 she has completed her course of antibiotics. Her right great toenail avulsed last week and she's been using silver alginate for this as well. Still using a wound VAC to the substantial stage IV wound over the left ischial tuberosity 03/05/2016 -- the patient has had a opinion from the plastic surgery group at [facility] and details of this are not available yet but the patient's verbal report has been heard by me. Did not sound like there was any optimistic discussion regarding reconstruction and the net result would be to continue with the wound VAC application. I will await the official reports. Addendum: -- she was seen at [facility] health Plastic surgery service by Dr. Tamela Oddi. After a thorough review and from what I understand spending 45 minutes with the patient his assessment has been noted by me in detail and the management options were: 1. Continued pressure offloading and wound care versus operative procedures including wound excision 2. Soft tissue and bone sampling 3. If the wound gets larger wound closure would be done using a variety of plastic surgical techniques including but not limited to skin substitute, possible skin graft, local versus regional flaps, negative pressure dressing application. 4. He discussed with her details of flap surgery and the risks associated 5. He made a comment that since the patient was operated on by Dr. Marzetta Board of [facility] plastic surgery unit in [place]  the patient may continue to follow-up there for further evaluation for surgical flap closure in the future. 03/19/2016 -- the patient continues to be rather depressed and frustrated with her lack of rapid progress in healing this wound especially because she thought after hyperbaric oxygen therapy the wound would heal extremely fast. She now understands that was not the implied benefit on wound care which was the recommendation for hyperbaric oxygen therapy. I have had a lengthy discussion with the patient and her husband regarding her options: 1. Continue with collagen and wound VAC for the primary dressing and offloading and all supportive care. 2. See Dr. Leta Baptist for possible placement of Acell or Integra in the OR. 3. get a second opinion from a wound care center and surrounding regions/counties 05/07/2016 -- Note from Dr. Sherrilyn Rist, who noted that the patient has declined flap surgery. She has discussed application of A cell, and try a few applications to see how the wound progresses. She is also recommended that we could apply products here in the wound center, like Oasis. during her preop workup it was found that her hemoglobin A1c was 11% and she has now been diagnosed as having diabetes mellitus and has been put on appropriate treatment by her PCP 05/28/2016 -- tells me her blood sugars have been doing well and she has an appointment to see her PCP in the next couple of weeks to check her hemoglobin A1c. Other than that she continues to do well. 06/25/2016 -- have not seen her back for the last month but she says her health has been about the same and she has an appointment to check the A1c next week 09/10/16 ---- was seen by Dr. Sherrilyn Rist -- who applied Acell and saw her back in follow-up. She has recommended silver alginate to the wound every other day and cover with foam. If no significant drainage could transition to collagen every other day. She recommended discontinuing  wound VAC. There were no plans to repeat application of Acell. The patient expressed  that her husband could do the wound care as going to the Wound Ctr., would cost several $100 for each visit. 10/21/2016 -- her insurance company is getting her new mattress and she is pleased about that. Other than that she has been doing dressings with PolyMem Silver and has been doing very well 02/18/2017 -- she has gone through several changes of her mattress and has not been pleased with any of them. The ventricles are still working on trying to fit her with the appropriate low air-loss mattress. She has a new wound on the gluteal area which is clearly separated from the original wound. 03/25/17-she is here in follow-up evaluation for her left ischialpressure ulcer. She remains unsatisfied with her pressure mattress. She admits to sitting multiple hours a day, in the bed. We have discussed offloading options. The wound does not appear infected. Nutrition does not appear to be a concern. Will follow-up in 4 weeks, if wound continues to be stalled may consider x-ray to evaluate for refractory osteomyelitis. 04/21/17; this is a patient that I don't know all that well. She has a chronic wound which at one point had underlying osteomyelitis in the left ischial tuberosity. This is a stage IV pressure ulcer. Over the last 3 months she has a stage II wound inferiorly to the original wound. The last time she was here her dressing was changed to silver collagen although the patient's husband who changes the dressing said that the collagen stuck to the wound and remove skin from the superficial area therefore he switched back to Lambert 05/13/17; this is a patient we've been following for a left ischial tuberosity wound which was stage IV at one point had underlying osteomyelitis. Over the last several months she's had a stage II wound just inferior and medial to the related to the wound. According to her husband he is using  Endoform layer with collagen although this is not what I had last time. According to her husband they are using Elgie Congo with collagen although I don't quite know how that started. She was hospitalized from 1/20 through 04/30/16. This was related to a UTI. Her blood cultures were negative, urine culture showed multiple species. She did have a CT scan of the abdomen and pelvis which documented chronic osteomyelitis in the area of the wound inflammatory markers were unremarkable. She has had prior knowledge of osteomyelitis. It looks as though she received IV antibiotics in 2017 and was treated with a course of hyperbaric oxygen. 05/28/17; the wound over the left ischial tuberosity is deeper today and abuts clearly on bone. Nursing intake reported drainage. I therefore culture of the wound. The more superficial area just below this looks about the same. They once again complained that there are mattress cover is not working although apparently [facility] is been noted to see this many times in the report is that the device is functional 06/18/17; the patient had a probing area on the left ischial tuberosity that was draining purulent fluid last time. This also clearly seemed to have open bone. Culture I did showed pansensitive pseudomonas including third generation cephalosporins. I treated this with cefdinir 300 twice a day for 10 days and things seem to have improved. She has a more superficial wound just underneath this area. Amazingly she has a new air mattress through advanced home care. I think they gave this to her as a parking give. In any case this now works according to the patient may have something to do  with why the areas are looking better. 07/09/17; the patient has a probing area in the left ischial tuberosity that still has some depth. However this is contracted in terms of the wound orifice although the depth is still roughly the same. There is no undermining. ooShe also has  the satellite wound which is more superficial. This appears to have a healthy surface we've been using silver collagen 08/06/17; the patient's wound is over the left ischial tuberosity and a satellite lesion just underneath this. The original wound was actually a deep stage 4 wound. We have made good progress in 2 months and there is no longer exposed bone here. 09/03/17; left ischial tuberosity actually appears to be quite healthy. I think we are making progress. No debridement is required. There is no surrounding erythema 10/01/17 I follow this patient monthly for her left ischial tuberosity wound. There is 2 areas the original area and a satellite area. The satellite area looks a lot better there is no surrounding erythema. Her husband relates that he is having trouble maintaining the dressing. This has to do with the soft tissue around it. He states he puts the collagen in but he cannot make sure that it stays in even with the ABD pads and tape that he is been using 10/29/17; patient arrives with a better looking noon today. Some of the satellite lesions have closed. using Prisma 11/26/17; the patient has a large cone-shaped area with the tip of the Cone deep within her buttock soft tissue. The walls of the Cone are epithelialized however the base is still open. The area at the base of this looks moist we've been using silver collagen. Will change to silver alginate 12/31/2017; the wound appears to have come in fairly nicely. Using silver alginate. There is no surrounding maceration or infection 01/28/18; there is still an open area here over the left initial tuberosity. Base of this however looks healthy. There is no surrounding infection 02/25/18; the area of its open is over the left ischial tuberosity. The base of this is where the wound is. This is a large inverted cone-shaped area with the wound at the tip. Dimensions of the wound at the tip are improved. There is a area of denuded skin about  halfway towards the tip which her husband thinks may have happened today when he was bathing her. 04/20/17; the area is still open over the left initial tuberosity. 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 This is now completely closed over although it is closed over with a divot and skin over bone at the base of this. Continued aggressive offloading will be necessary. 12/30/2018 READMISSION This is a 70 year old woman with chronic paraplegia. I picked her up for her care from Dr. Con Memos in this clinic after he departed. She had a stage IV pressure wound over the left ischial tuberosity. She was treated twice for her underlying osteomyelitis and this I believe firstly in 2016 and again in 2017. There were some plans at some point for flap closure of this however she was discovered to have uncontrolled diabetes and I do not think this was ever accomplished. She ultimately healed over in this clinic and was discharged in May. She has a large cone-shaped indentation with the tip of this going towards the left ischial  tuberosity. It is not an easy area to examine but at that time I thought all of this was epithelialized. Apparently there was a reopening here shortly after she left the clinic last time. She was admitted to hospital at the end of June for Klebsiella bacteremia felt to be secondary to UTI. A CT scan of the pelvis is listed below and there was initially some concern that she had underlying osteomyelitis although I believe she was seen by infectious disease and that was felt to be not the case: I do not see any new cultures or inflammatory markers IMPRESSION: 1. No CT evidence for acute intra-abdominal or pelvic abnormality. Large volume of stool throughout the colon. 2. Enlarged fatty liver with  fat sparing near the gallbladder fossa 3. Cortical scarring right kidney. Bilateral intrarenal stones without hydronephrosis. Thick-walled urinary bladder decompressed by suprapubic catheter 4. Deep left decubitus ulcer with underlying left ischial changes suggesting osteomyelitis. Her husband has been using silver collagen in the wound. She has not been systemically unwell no fever chills eating and drinking well. They rigorously offload this wound only getting up in the wheelchair when she is going to appointments the rest of the time she is in bed. 10/8; wound measures larger and she now has exposed bone. We have been using silver alginate 11/12 still using silver alginate. The patient saw Dr. Megan Salon of infectious disease. She was started on Augmentin 500 mg twice daily. She is due to follow-up with Dr. Megan Salon I believe next week. Lab work Dr. Megan Salon requested showed a sedimentation rate of 28 and CRP of 20 although her CRP 1 year ago was 18.8. Sedimentation rate 1 year ago was 11 basic metabolic panel showed a creatinine of 1.12 12/3; the patient followed up with Dr. Megan Salon yesterday. She is still on Augmentin twice daily. This was directed by Dr. Megan Salon. The patient's inflammatory markers have improved which is gratifying. Her C-reactive protein was repeated yesterday and follow-up booked with infectious disease in January. In addition I have been getting secure text messages I think from palliative care through [facility] [employer]. I think they were hoping to provide services to the patient in her home. They could not get a hold of the primary physician and so they reached out to me on 2 separate occasions. 12/17; patient last saw Dr. Megan Salon on 12/2. She is finishing up with Augmentin. Her C-reactive protein was 20 on 10/21, 10.1 on 11/19 and 17 on 12/2. The wound itself still has depth and undermining. We are using Santyl with the backing wet-to-dry 04/27/2019.  The wound is gradually clearing up in terms of the surface although it is not filled in that much. Still abuts right against bone 2/4; patient with a deep pressure ulcer over the left ischial tuberosity. I thought she was going to follow-up with infectious disease to follow her inflammatory markers although the patient states that they stated that they did not need to see her unless we felt it was necessary. I will need to check their notes. In any case we ordered moistened silver collagen back with wet-to-dry to fill in the depth of the wound although apparently [facility] sent silver alginate which they have been using since they were here the last time. Is obviously not what we ordered. 2/25. Not much change in this wound it is over the left ischial tuberosity recurrent wound. We have been using silver collagen with backing wet-to-dry. I think the wound is about the same. There  is still some tunneling from about 10-12 o'clock over the ischial tuberosity itself 3/11; pressure ulcer over the left ischial tuberosity. Since she was last here the wound VAC was started and apparently going quite well. We are able to get the home health company that accepts [place] healthcare which is in itself sometimes problematic. There is been improvements in the wound the tunneling seems to be better and is contracted nicely 4/8; 1 month follow-up. Since she was last here we have been using silver collagen under a wound VAC. Some minor contraction I think in wound volume. She is cared for diligently by her husband including pressure relief, incontinence management, nutritional support etc. 6/1; this is almost a 102-month follow-up. She is been using silver collagen under wound VAC. Circular area over the left ischial tuberosity. She has been using silver collagen under wound VAC 7/8; 1 month follow-up. Silver collagen under the VAC not really a lot of progress. Tissue at the base of the wound which is right against bone and  the tissue next that this does not look completely viable. She is not currently on any antibiotics, she had underlying osteomyelitis I need to look this over 8/16; we are using silver collagen under wound VAC to the left ischial tuberosity wound. Comes in today with absolutely no change in surface area or depth. There is no exposed bone. I did look over her infectious disease notes as I said I would do last time. She last saw Dr. Megan Salon in December 2020. She completed 6 weeks of Augmentin. This was in response to a bone culture I did showing methicillin susceptible staph aureus and Enterococcus. She was supposed to come back to see Dr. Megan Salon at some point although they say that that appointment was canceled unless I chose to recommend return. I think there was supposed to be follow-up with inflammatory markers but I cannot see that that was ever done. She has not been on antibiotics since 9/21; monthly follow-up. We received a call from home health nurse last evening to report green drainage coming out of the wound. Lab work I ordered last time showed a white count of 5.2 a sedimentation rate of 45 and a C-reactive protein of 25 however neither one of the 2 values are substantially different from her previous values in October 2020 or December 2020. Both are slightly higher but only marginally. Otherwise no new complaints from the patient or her husband 10/19; 1 month follow-up. PCR culture I did last time showed medium quantities of Pseudomonas lower quantities Klebsiella and Enterococcus faecalis group B strep and Peptostreptococcus. I gave her Augmentin for 2 weeks. I am not really sure of my choice of this I would not cover Pseudomonas. She is still having green drainage. Wound itself looks satisfactory there is not a lot of depth wound bed looks healthy 11/16; patient has completed the antibiotics still using gentamicin and silver alginate on the wound. There is improvement in the surface  area 12/21; in general the area on the buttock looks somewhat better. Surface looks healthy although I do not know that there is been much improvement in the wound volume. We have been using silver alginate and Hydrofera Blue. Less drainage. In passing the husband showed me an abrasion injury on the left anterior tibia. Covered in necrotic surface. He has noticed this for about a week and has been putting silver collagen on it. He is completely uncertain about how this happened 1/25; monthly follow-up. The area on the left  buttock is about the same. This does not go to bone but a fairly deep wound surface of the wound is of questionable viability. The abrasion injury that they showed me last time apparently was closed out by home health because they thought it was healed but certainly is not although it is just about healed. As a result they haven't been applying anything to this area Finally I did discuss with the patient and her husband the idea of an advanced treatment product to try and get a proper base to this wound I was thinking of Puraply however actually the patient points out that her co-pay for coming to visit [place]. the facility, charge would be unaffordable if they have to, on a weekly basis Objective Constitutional Sitting or standing Blood Pressure is within target range for patient.. Pulse regular and within target range for patient.Marland Kitchen Respirations regular, non-labored and within target range.. Temperature is normal and within the target range for the patient.Marland Kitchen Appears in no distress. Vitals Time Taken: 11:21 AM, Height: 63 in, Weight: 185 lbs, BMI: 32.8, Temperature: 97.7 F, Pulse: 86 bpm, Respiratory Rate: 18 breaths/min, Blood Pressure: 132/76 mmHg. General Notes: Wound exam; ooleft ischial tuberosity. Surface of this does not look too bad there is no exposed bone however this is a deep punched-out area. Granulation tissue does not appear to be filling in ooon the right  anterior tibial area mid aspect there is still some callus and dry skin over the surface I remove this with a #3 curette minimal open wounds but this is certainly not closed Integumentary (Hair, Skin) Wound #12 status is Open. Original cause of wound was Pressure Injury. The wound is located on the Left Ischial Tuberosity. The wound measures 2.5cm length x 1cm width x 1cm depth; 1.963cm^2 area and 1.963cm^3 volume. There is Fat Layer (Subcutaneous Tissue) exposed. There is no tunneling or undermining noted. There is a medium amount of serosanguineous drainage noted. The wound margin is well defined and not attached to the wound base. There is large (67-100%) red, pink granulation within the wound bed. There is no necrotic tissue within the wound bed. Wound #13 status is Open. Original cause of wound was Trauma. The wound is located on the Right,Distal,Anterior Lower Leg. The wound measures 3.4cm length x 0.6cm width x 0.1cm depth; 1.602cm^2 area and 0.16cm^3 volume. There is Fat Layer (Subcutaneous Tissue) exposed. There is no tunneling or undermining noted. There is a medium amount of serosanguineous drainage noted. The wound margin is distinct with the outline attached to the wound base. There is small (1-33%) red, pink granulation within the wound bed. There is a large (67-100%) amount of necrotic tissue within the wound bed including Eschar and Adherent Slough. 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 There was a Selective/Open Wound Skin/Dermis Debridement with a  total area of 2.04 sq cm performed by Ricard Dillon., MD. With the following instrument(s): Curette to remove Viable and Non-Viable tissue/material. Material removed includes Callus and Skin: Dermis and after achieving pain control using Lidocaine. No specimens were taken. A time out was conducted at 11:49, prior to the start  of the procedure. A Minimum amount of bleeding was controlled with Pressure. The procedure was tolerated well with a pain level of 0 throughout and a pain level of 0 following the procedure. Post Debridement Measurements: 3.4cm length x 0.6cm width x 0.1cm depth; 0.16cm^3 volume. Character of Wound/Ulcer Post Debridement is improved. Severity of Tissue Post Debridement is: Fat layer exposed. Post procedure Diagnosis Wound #13: Same as Pre-Procedure Plan Follow-up Appointments: Return appointment in 1 month. Bathing/ Shower/ Hygiene: May shower and wash wound with soap and water. - with dressingchanges Edema Control - Lymphedema / SCD / Other: Elevate legs to the level of the heart or above for 30 minutes daily and/or when sitting, a frequency of: Avoid standing for long periods of time. Off-Loading: Turn and reposition every 2 hours Home Health: New wound care orders this week; continue Home Health for wound care. May utilize formulary equivalent dressing for wound treatment orders unless otherwise specified. WOUND #12: - Ischial Tuberosity Wound Laterality: Left Cleanser: Soap and Water Every Other Day/30 Days Discharge Instructions: May shower and wash wound with dial antibacterial soap and water prior to dressing change. Cleanser: Wound Cleanser [facility]) Every Other Day/30 Days Discharge Instructions: Cleanse the wound with wound cleanser prior to applying a clean dressing using gauze sponges, not tissue or cotton balls. Peri-Wound Care: Skin Prep [facility]) Every Other Day/30 Days Discharge Instructions: Use skin prep as directed Prim Dressing:  Promogran Prisma Matrix, 4.34 (sq in) (silver collagen) (Home Health) Every Other Day/30 Days ary Discharge Instructions: Moisten collagen with saline or hydrogel Secondary Dressing: Optifoam Non-Adhesive Dressing, 4x4 in [facility]) Every Other Day/30 Days Discharge Instructions: Apply over primary dressing as directed. WOUND #13: - Lower Leg Wound Laterality: Right, Anterior, Distal Cleanser: Soap and Water Every Other Day/30 Days Discharge Instructions: May shower and wash wound with dial antibacterial soap and water prior to dressing change. Cleanser: Wound Cleanser [facility]) Every Other Day/30 Days Discharge Instructions: Cleanse the wound with wound cleanser prior to applying a clean dressing using gauze sponges, not tissue or cotton balls. Peri-Wound Care: Skin Prep [facility]) Every Other Day/30 Days Discharge Instructions: Use skin prep as directed Prim Dressing: Maxorb Extra Calcium Alginate Dressing, 4x4 in [facility]) Every Other Day/30 Days ary Discharge Instructions: Apply calcium alginate to wound bed as instructed Secondary Dressing: Optifoam Non-Adhesive Dressing, 4x4 in [facility]) Every Other Day/30 Days Discharge Instructions: Apply over primary dressing as directed. 1. I'm going to continue with the collagen to the area on the left ischial tuberosity. 2. This is not in a good place for a wound VAC I don't think we could maintain the seal here. 3. Because of the facility charge and coming weekly they declined an advanced treatment product. 4. The area on the right anterior tibia looks a lot better but it was certainly not healed we will use silver alginate on this Electronic Signature(s) Signed: 05/01/2020 5:14:25 PM By: Linton Ham MD Entered By: Linton Ham on 05/01/2020 12:21:32 -------------------------------------------------------------------------------- SuperBill Details Patient Name: Date of Service: [name] 05/01/2020 Medical  Record Number: [id number] Patient Account Number: [id number] Date of Birth/Sex: Treating RN: 1950-09-28 (70 y.o. [name], [name] Primary Care Provider: [name] Other Clinician: Referring Provider: Treating Provider/Extender: Tommy Rainwater in Treatment: 70 Diagnosis Coding ICD-10 Codes Code Description [phone] Pressure ulcer of left buttock, stage 4 E11.622 Type 2 diabetes mellitus with other skin ulcer S80.811D Abrasion, right lower leg, subsequent encounter L97.811 Non-pressure chronic ulcer  of other part of right lower leg limited to breakdown of skin Facility Procedures The patient participates with Medicare or their insurance follows the Medicare Facility Guidelines: CPT4 Code Description Modifier Quantity [id number] - DEBRIDE WOUND 1ST 20 SQ CM OR < 1 ICD-10 Diagnosis Description L97.811 Non-pressure chronic ulcer  of other part of right lower leg limited to breakdown of skin Physician Procedures : CPT4 Code Description Modifier [id number] - WC PHYS DEBR WO ANESTH 20 SQ CM ICD-10 Diagnosis Description L97.811 Non-pressure chronic ulcer of other part of right lower leg limited to breakdown of skin Quantity: 1 Electronic Signature(s) Signed: 05/01/2020 5:14:25 PM By: Linton Ham MD Entered By: Linton Ham on 05/01/2020 12:21:53

## 2020-05-29 ENCOUNTER — Encounter (HOSPITAL_BASED_OUTPATIENT_CLINIC_OR_DEPARTMENT_OTHER): Payer: Medicare Other | Attending: Internal Medicine | Admitting: Internal Medicine

## 2020-05-29 ENCOUNTER — Other Ambulatory Visit: Payer: Self-pay

## 2020-05-29 DIAGNOSIS — L97811 Non-pressure chronic ulcer of other part of right lower leg limited to breakdown of skin: Secondary | ICD-10-CM | POA: Diagnosis not present

## 2020-05-29 DIAGNOSIS — E11622 Type 2 diabetes mellitus with other skin ulcer: Secondary | ICD-10-CM | POA: Diagnosis not present

## 2020-05-29 DIAGNOSIS — S80811A Abrasion, right lower leg, initial encounter: Secondary | ICD-10-CM | POA: Insufficient documentation

## 2020-05-29 DIAGNOSIS — X58XXXA Exposure to other specified factors, initial encounter: Secondary | ICD-10-CM | POA: Insufficient documentation

## 2020-05-29 DIAGNOSIS — G8220 Paraplegia, unspecified: Secondary | ICD-10-CM | POA: Insufficient documentation

## 2020-05-29 DIAGNOSIS — L89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, stage 4: Secondary | ICD-10-CM | POA: Insufficient documentation

## 2020-05-29 NOTE — Progress Notes (Signed)
[name], [name] ([id number]) Visit Report for 05/29/2020 Arrival Information Details Patient Name: Date of Service: [name], [name] 05/29/2020 10:45 A M Medical Record Number: [id number] Patient Account Number: [id number] Date of Birth/Sex: Treating RN: 06/13/1950 (70 y.o. [name] Primary Care Provider: [name] Other Clinician: Referring Provider: Treating Provider/Extender: Tommy Rainwater in Treatment: 27 Visit Information History Since Last Visit Added or deleted any medications: No Patient Arrived: Wheel Chair Any new allergies or adverse reactions: No Arrival Time: 10:48 Had a fall or experienced change in No Accompanied By: spouse activities of daily living that may affect Transfer Assistance: Manual risk of falls: Patient Identification Verified: Yes Signs or symptoms of abuse/neglect since last visito No Secondary Verification Process Completed: Yes Hospitalized since last visit: No Patient Requires Transmission-Based Precautions: No Implantable device outside of the clinic excluding No Patient Has Alerts: No cellular tissue based products placed in the center since last visit: Has Dressing in Place as Prescribed: Yes Pain Present Now: Yes Electronic Signature(s) Signed: 05/29/2020 5:38:13 PM By: Baruch Gouty RN, BSN Entered By: Baruch Gouty on 05/29/2020 10:52:04 -------------------------------------------------------------------------------- Clinic Level of Care Assessment Details Patient Name: Date of Service: [name] 05/29/2020 10:45 A M Medical Record Number: [id number] Patient Account Number: [id number] Date of Birth/Sex: Treating RN: 09/28/1950 (70 y.o. [name], [name] Primary Care Provider: [name] Other Clinician: Referring Provider: Treating Provider/Extender: Tommy Rainwater in Treatment: 73 Clinic Level of Care Assessment Items TOOL 4 Quantity Score X- 1 0 Use when only an  EandM is performed on FOLLOW-UP visit ASSESSMENTS - Nursing Assessment / Reassessment X- 1 10 Reassessment of Co-morbidities (includes updates in patient status) X- 1 5 Reassessment of Adherence to Treatment Plan ASSESSMENTS - Wound and Skin A ssessment / Reassessment []  - 0 Simple Wound Assessment / Reassessment - one wound X- 2 5 Complex Wound Assessment / Reassessment - multiple wounds X- 1 10 Dermatologic / Skin Assessment (not related to wound area) ASSESSMENTS - Focused Assessment X- 1 5 Circumferential Edema Measurements - multi extremities X- 1 10 Nutritional Assessment / Counseling / Intervention []  - 0 Lower Extremity Assessment (monofilament, tuning fork, pulses) []  - 0 Peripheral Arterial Disease Assessment (using hand held doppler) ASSESSMENTS - Ostomy and/or Continence Assessment and Care []  - 0 Incontinence Assessment and Management []  - 0 Ostomy Care Assessment and Management (repouching, etc.) PROCESS - Coordination of Care X - Simple Patient / Family Education for ongoing care 1 15 []  - 0 Complex (extensive) Patient / Family Education for ongoing care X- 1 10 Staff obtains Programmer, systems, Records, T Results / Process Orders est X- 1 10 Staff telephones HHA, Nursing Homes / Clarify orders / etc []  - 0 Routine Transfer to another Facility (non-emergent condition) []  - 0 Routine Hospital Admission (non-emergent condition) []  - 0 New Admissions / Biomedical engineer / Ordering NPWT Apligraf, etc. , []  - 0 Emergency Hospital Admission (emergent condition) X- 1 10 Simple Discharge Coordination []  - 0 Complex (extensive) Discharge Coordination PROCESS - Special Needs []  - 0 Pediatric / Minor Patient Management []  - 0 Isolation Patient Management []  - 0 Hearing / Language / Visual special needs []  - 0 Assessment of Community assistance (transportation, D/C planning, etc.) []  - 0 Additional assistance / Altered mentation []  - 0 Support Surface(s)  Assessment (bed, cushion, seat, etc.) INTERVENTIONS - Wound Cleansing / Measurement []  - 0 Simple Wound Cleansing - one wound X- 2 5 Complex Wound Cleansing -  multiple wounds X- 1 5 Wound Imaging (photographs - any number of wounds) []  - 0 Wound Tracing (instead of photographs) []  - 0 Simple Wound Measurement - one wound X- 2 5 Complex Wound Measurement - multiple wounds INTERVENTIONS - Wound Dressings []  - 0 Small Wound Dressing one or multiple wounds X- 2 15 Medium Wound Dressing one or multiple wounds []  - 0 Large Wound Dressing one or multiple wounds []  - 0 Application of Medications - topical []  - 0 Application of Medications - injection INTERVENTIONS - Miscellaneous []  - 0 External ear exam []  - 0 Specimen Collection (cultures, biopsies, blood, body fluids, etc.) []  - 0 Specimen(s) / Culture(s) sent or taken to Lab for analysis []  - 0 Patient Transfer (multiple staff / Civil Service fast streamer / Similar devices) []  - 0 Simple Staple / Suture removal (25 or less) []  - 0 Complex Staple / Suture removal (26 or more) []  - 0 Hypo / Hyperglycemic Management (close monitor of Blood Glucose) []  - 0 Ankle / Brachial Index (ABI) - do not check if billed separately X- 1 5 Vital Signs Has the patient been seen at the hospital within the last three years: Yes Total Score: 155 Level Of Care: New/Established - Level 4 Electronic Signature(s) Signed: 05/29/2020 5:43:17 PM By: Rhae Hammock RN Entered By: Rhae Hammock on 05/29/2020 11:37:50 -------------------------------------------------------------------------------- Encounter Discharge Information Details Patient Name: Date of Service: [name] Pulse M. 05/29/2020 10:45 A M Medical Record Number: [id number] Patient Account Number: [id number] Date of Birth/Sex: Treating RN: [birth year] (70 y.o. [name] Primary Care Provider: [name] Other Clinician: Referring Provider: Treating Provider/Extender: Tommy Rainwater in Treatment: 21 Encounter Discharge Information Items Discharge Condition: Stable Ambulatory Status: Wheelchair Discharge Destination: Home Transportation: Private Auto Accompanied By: husband Schedule Follow-up Appointment: Yes Clinical Summary of Care: Patient Declined Electronic Signature(s) Signed: 05/29/2020 5:33:10 PM By: Levan Hurst RN, BSN Entered By: Levan Hurst on 05/29/2020 12:52:23 -------------------------------------------------------------------------------- Lower Extremity Assessment Details Patient Name: Date of Service: [name] Brunner. 05/29/2020 10:45 A M Medical Record Number: [id number] Patient Account Number: [id number] Date of Birth/Sex: Treating RN: 10/01/1950 (70 y.o. [name] Primary Care Provider: [name] Other Clinician: Referring Provider: Treating Provider/Extender: Tommy Rainwater in Treatment: 73 Edema Assessment Assessed: [name]: No] [name]: No] Edema: [Left: N] [Right: o] Calf Left: Right: Point of Measurement: From Medial Instep 31.5 cm Ankle Left: Right: Point of Measurement: From Medial Instep 21 cm Vascular Assessment Pulses: Dorsalis Pedis Palpable: [Right:Yes] Electronic Signature(s) Signed: 05/29/2020 5:38:13 PM By: Baruch Gouty RN, BSN Entered By: Baruch Gouty on 05/29/2020 10:58:36 -------------------------------------------------------------------------------- Multi Wound Chart Details Patient Name: Date of Service: [name] Pulse M. 05/29/2020 10:45 A M Medical Record Number: [id number] Patient Account Number: [id number] Date of Birth/Sex: Treating RN: 1950/08/29 (70 y.o. [name], [name] Primary Care Provider: [name] Other Clinician: Referring Provider: Treating Provider/Extender: Tommy Rainwater in Treatment: 76 Vital Signs Height(in): 38 Pulse(bpm): 105 Weight(lbs): 185 Blood Pressure(mmHg): 159/87 Body Mass Index(BMI):  33 Temperature(F): 98.3 Respiratory Rate(breaths/min): 18 Photos: [12:No Photos Left Ischial Tuberosity] [13:No Photos Right, Distal, Anterior Lower Leg] [N/A:N/A N/A] Wound Location: [12:Pressure Injury] [13:Trauma] [N/A:N/A] Wounding Event: [12:Pressure Ulcer] [13:Diabetic Wound/Ulcer of the Lower] [N/A:N/A] Primary Etiology: [12:Anemia, Hypertension, Type II] [13:Extremity Anemia, Hypertension, Type II] [N/A:N/A] Comorbid History: [12:Diabetes, Osteoarthritis, Dementia, Diabetes, Osteoarthritis, Dementia, Paraplegia, Received Radiation 09/06/2018] [13:Paraplegia, Received Radiation 03/23/2020] [N/A:N/A] Date Acquired: [12:73] [13:9] [N/A:N/A] Weeks of Treatment: [12:Open] [13:Open] [N/A:N/A]  Wound Status: [12:3x1.1x1.3] [13:1.1x0.8x0.1] [N/A:N/A] Measurements L x W x D (cm) [12:2.592] [13:0.691] [N/A:N/A] A (cm) : rea [12:3.369] [13:0.069] [N/A:N/A] Volume (cm) : [12:-998.30%] [13:64.10%] [N/A:N/A] % Reduction in A rea: [12:-1489.20%] [13:64.10%] [N/A:N/A] % Reduction in Volume: [12:Category/Stage IV] [13:Grade 2] [N/A:N/A] Classification: [12:Medium] [13:None Present] [N/A:N/A] Exudate A mount: [12:Serosanguineous] [13:N/A] [N/A:N/A] Exudate Type: [12:red, brown] [13:N/A] [N/A:N/A] Exudate Color: [12:Well defined, not attached] [13:Flat and Intact] [N/A:N/A] Wound Margin: [12:Large (67-100%)] [13:Large (67-100%)] [N/A:N/A] Granulation A mount: [12:Red, Pink] [13:Pink] [N/A:N/A] Granulation Quality: [12:None Present (0%)] [13:None Present (0%)] [N/A:N/A] Necrotic A mount: [12:Fat Layer (Subcutaneous Tissue): Yes Fat Layer (Subcutaneous Tissue): Yes N/A] Exposed Structures: [12:Fascia: No Tendon: No Muscle: No Joint: No Bone: No None] [13:Fascia: No Tendon: No Muscle: No Joint: No Bone: No Medium (34-66%)] [N/A:N/A] Treatment Notes Electronic Signature(s) Signed: 05/29/2020 5:22:30 PM By: Linton Ham MD Signed: 05/29/2020 5:43:17 PM By: Rhae Hammock RN Entered By: Linton Ham on 05/29/2020 12:20:43 -------------------------------------------------------------------------------- Multi-Disciplinary Care Plan Details Patient Name: Date of Service: [name], [name] Posey M. 05/29/2020 10:45 A M Medical Record Number: [id number] Patient Account Number: [id number] Date of Birth/Sex: Treating RN: 1951-03-11 (70 y.o. [name], [name] Primary Care Provider: [name] Other Clinician: Referring Provider: Treating Provider/Extender: Tommy Rainwater in Treatment: 22 Active Inactive Wound/Skin Impairment Nursing Diagnoses: Knowledge deficit related to ulceration/compromised skin integrity Goals: Patient/caregiver will verbalize understanding of skin care regimen Date Initiated: 12/30/2018 Target Resolution Date: 06/08/2020 Goal Status: Active Interventions: Assess patient/caregiver ability to obtain necessary supplies Assess patient/caregiver ability to perform ulcer/skin care regimen upon admission and as needed Assess ulceration(s) every visit Provide education on ulcer and skin care Treatment Activities: Skin care regimen initiated : 12/30/2018 Topical wound management initiated : 12/30/2018 Notes: Electronic Signature(s) Signed: 05/29/2020 5:43:17 PM By: Rhae Hammock RN Entered By: Rhae Hammock on 05/29/2020 11:31:00 -------------------------------------------------------------------------------- Pain Assessment Details Patient Name: Date of Service: [name] Brunner. 05/29/2020 10:45 A M Medical Record Number: [id number] Patient Account Number: [id number] Date of Birth/Sex: Treating RN: January 30, 1951 (70 y.o. [name] Primary Care Provider: [name] Other Clinician: Referring Provider: Treating Provider/Extender: Tommy Rainwater in Treatment: 56 Active Problems Location of Pain Severity and Description of Pain Patient Has Paino Yes Site Locations With Dressing Change: No Duration of the  Pain. Constant / Intermittento Intermittent Rate the pain. Current Pain Level: 3 Character of Pain Describe the Pain: Other: tingling Pain Management and Medication Current Pain Management: Medication: Yes Is the Current Pain Management Adequate: Adequate How does your wound impact your activities of daily livingo Sleep: No Bathing: No Appetite: No Relationship With Others: No Bladder Continence: No Emotions: No Bowel Continence: No Work: No Toileting: No Drive: No Dressing: No Hobbies: No Notes reports tingling in Personnel officer) Signed: 05/29/2020 5:38:13 PM By: Baruch Gouty RN, BSN Entered By: Baruch Gouty on 05/29/2020 10:55:45 -------------------------------------------------------------------------------- Patient/Caregiver Education Details Patient Name: Date of Service: [name] Brunner 2/22/2022andnbsp10:45 A M Medical Record Number: [id number] Patient Account Number: [id number] Date of Birth/Gender: Treating RN: 15-Aug-1950 (70 y.o. [name], [name] Primary Care Physician: [name] Other Clinician: Referring Physician: Treating Physician/Extender: Tommy Rainwater in Treatment: 78 Education Assessment Education Provided To: Patient Education Topics Provided Wound/Skin Impairment: Methods: Explain/Verbal Responses: State content correctly [employer]) Signed: 05/29/2020 5:43:17 PM By: Rhae Hammock RN Entered By: Rhae Hammock on 05/29/2020 11:31:16 -------------------------------------------------------------------------------- Wound Assessment Details Patient Name: Date of Service: [name], [name] Posey M. 05/29/2020 10:45 A M  Medical Record Number: [id number] Patient Account Number: [id number] Date of Birth/Sex: Treating RN: [birth year] (70 y.o. [name], [name] Primary Care Provider: [name] Other Clinician: Referring Provider: Treating Provider/Extender: Tommy Rainwater in  Treatment: 73 Wound Status Wound Number: 12 Primary Pressure Ulcer Etiology: Wound Location: Left Ischial Tuberosity Wound Open Wounding Event: Pressure Injury Status: Date Acquired: 09/06/2018 Comorbid Anemia, Hypertension, Type II Diabetes, Osteoarthritis, Weeks Of Treatment: 73 History: Dementia, Paraplegia, Received Radiation Clustered Wound: No Wound Measurements Length: (cm) 3 Width: (cm) 1.1 Depth: (cm) 1.3 Area: (cm) 2.592 Volume: (cm) 3.369 % Reduction in Area: -998.3% % Reduction in Volume: -1489.2% Epithelialization: None Tunneling: No Undermining: No Wound Description Classification: Category/Stage IV Wound Margin: Well defined, not attached Exudate Amount: Medium Exudate Type: Serosanguineous Exudate Color: red, brown Foul Odor After Cleansing: No Slough/Fibrino No Wound Bed Granulation Amount: Large (67-100%) Exposed Structure Granulation Quality: Red, Pink Fascia Exposed: No Necrotic Amount: None Present (0%) Fat Layer (Subcutaneous Tissue) Exposed: Yes Tendon Exposed: No Muscle Exposed: No Joint Exposed: No Bone Exposed: No Treatment Notes Wound #12 (Ischial Tuberosity) Wound Laterality: Left Cleanser Soap and Water Discharge Instruction: May shower and wash wound with dial antibacterial soap and water prior to dressing change. Wound Cleanser Discharge Instruction: Cleanse the wound with wound cleanser prior to applying a clean dressing using gauze sponges, not tissue or cotton balls. Peri-Wound Care Skin Prep Discharge Instruction: Use skin prep as directed Topical Primary Dressing Hydrofera Blue Classic Foam, 4x4 in Discharge Instruction: Moisten with saline prior to applying to wound bed Secondary Dressing Optifoam Non-Adhesive Dressing, 4x4 in Discharge Instruction: Apply over primary dressing as directed. Secured With Compression Wrap Compression Stockings Environmental education officer) Signed: 05/29/2020 5:38:13 PM By: Baruch Gouty RN, BSN Entered By: Baruch Gouty on 05/29/2020 11:08:19 -------------------------------------------------------------------------------- Wound Assessment Details Patient Name: Date of Service: [name] Brunner. 05/29/2020 10:45 A M Medical Record Number: [id number] Patient Account Number: [id number] Date of Birth/Sex: Treating RN: 05-25-1950 (70 y.o. [name], [name] Primary Care Provider: [name] Other Clinician: Referring Provider: Treating Provider/Extender: Tommy Rainwater in Treatment: 73 Wound Status Wound Number: 13 Primary Diabetic Wound/Ulcer of the Lower Extremity Etiology: Wound Location: Right, Distal, Anterior Lower Leg Wound Open Wounding Event: Trauma Status: Date Acquired: 03/23/2020 Comorbid Anemia, Hypertension, Type II Diabetes, Osteoarthritis, Weeks Of Treatment: 9 History: Dementia, Paraplegia, Received Radiation Clustered Wound: No Wound Measurements Length: (cm) 1.1 Width: (cm) 0.8 Depth: (cm) 0.1 Area: (cm) 0.691 Volume: (cm) 0.069 % Reduction in Area: 64.1% % Reduction in Volume: 64.1% Epithelialization: Medium (34-66%) Tunneling: No Undermining: No Wound Description Classification: Grade 2 Wound Margin: Flat and Intact Exudate Amount: None Present Foul Odor After Cleansing: No Slough/Fibrino No Wound Bed Granulation Amount: Large (67-100%) Exposed Structure Granulation Quality: Pink Fascia Exposed: No Necrotic Amount: None Present (0%) Fat Layer (Subcutaneous Tissue) Exposed: Yes Tendon Exposed: No Muscle Exposed: No Joint Exposed: No Bone Exposed: No Treatment Notes Wound #13 (Lower Leg) Wound Laterality: Right, Anterior, Distal Cleanser Soap and Water Discharge Instruction: May shower and wash wound with dial antibacterial soap and water prior to dressing change. Wound Cleanser Discharge Instruction: Cleanse the wound with wound cleanser prior to applying a clean dressing using gauze sponges, not  tissue or cotton balls. Peri-Wound Care Skin Prep Discharge Instruction: Use skin prep as directed Topical Primary Dressing Hydrofera Blue Classic Foam, 4x4 in Discharge Instruction: Moisten with saline prior to applying to wound bed Secondary Dressing Optifoam Non-Adhesive Dressing, 4x4 in Discharge Instruction:  Apply over primary dressing as directed. Secured With Compression Wrap Compression Stockings Environmental education officer) Signed: 05/29/2020 5:38:13 PM By: Baruch Gouty RN, BSN Entered By: Baruch Gouty on 05/29/2020 11:00:04 -------------------------------------------------------------------------------- Vitals Details Patient Name: Date of Service: [name], [name] Breach. 05/29/2020 10:45 A M Medical Record Number: [id number] Patient Account Number: [id number] Date of Birth/Sex: Treating RN: 14-Nov-1950 (70 y.o. [name], [name] Primary Care Provider: [name] Other Clinician: Referring Provider: Treating Provider/Extender: Tommy Rainwater in Treatment: 73 Vital Signs Time Taken: 10:52 Temperature (F): 98.3 Height (in): 63 Pulse (bpm): 105 Source: Stated Respiratory Rate (breaths/min): 18 Weight (lbs): 185 Blood Pressure (mmHg): 159/87 Source: Stated Reference Range: 80 - 120 mg / dl Body Mass Index (BMI): 32.8 Electronic Signature(s) Signed: 05/29/2020 5:38:13 PM By: Baruch Gouty RN, BSN Entered By: Baruch Gouty on 05/29/2020 10:52:43

## 2020-05-29 NOTE — Progress Notes (Signed)
[name], [name] ([id number]) Visit Report for 05/29/2020 HPI Details Patient Name: Date of Service: [name], [name] 05/29/2020 10:45 A M Medical Record Number: [id number] Patient Account Number: [id number] Date of Birth/Sex: Treating RN: [id number] (70 y.o. [name], [name] Primary Care Provider: [name] Other Clinician: Referring Provider: Treating Provider/Extender: Tommy Rainwater in Treatment: 65 History of Present Illness Location: open ulceration of the left gluteal area, left heel and right ankle for about 5 months. Quality: Patient reports No Pain. Severity: Patient states wound(s) are getting worse. Duration: Patient has had the wound for > 5 months prior to seeking treatment at the wound center Context: The wound occurred when the patient has been paraplegic for about 3 years. Modifying Factors: Wound improving due to current treatment. ssociated Signs and Symptoms: Patient reports having foul odor. A HPI Description: this 70 year old patient who is known to have hypertension, hypothyroidism, breast cancer, chronic pain syndrome, paraplegia was noted to have a left gluteal decubitus ulcer and was brought into the hospital. During the course of her hospitalization she was debrided in the operating room by ankle wound. Bone cultures were taken at that time but were negative but clinically she was treated for osteomyelitis because of the probing down to bone and open exposed bone. Home health has been giving her antibioticss which include vancomycin and Zosyn. The patient was a smoker until about 3 weeks ago and used to smoke about 10 cigarettes a day for a long while. 12/13/2014 - details of her operative note from 11/03/2014 were reviewed -- PROCEDURE: 1. Excisional debridement skin, subcutaneous, muscle left ischium 35 cm2 2. Excisional debridement skin, subcutaneous tissue left heel 27 cm2 3. Excisional debridement right ankle skin, subcutaneous, bone  30 cm2 01/24/2015 -- she has some issues with her wheelchair cushion but other than that is doing very well and has received Podus boots for her feet. 02/14/2015 -- she was using her old offloading boots and this seemed to have caused her a new pressure ulcer on the left posterior heel near the superior part just below the Achilles tendon. 03/07/2015 -- she has a new ulceration just to the left of the midline on her sacral region more on the left buttock and this has been there for Dr. Leland Johns and had all the wounds sharply debrided. The debridement was done for the left ischial wound, the left heel wound and the right about a week. 08/22/2015 -- was recently admitted to hospital between May 5 and 08/13/2015, with sepsis and leukocytosis due to a UTI. she was treated for a sepsis complicating Escherichia coli UTI and kidney stones. She also had metabolic and careful up at the secondary to pyelonephritis. He received broad-spectrum antibiotics initially and then received Macrobid as per urology. She was sent home on nitrofurantoin. during her admission she had a CT scan which showed exposed left ischial tuberosity without evidence of osteolysis. 09/12/2015-- the patient is having some issues with her air mattress and would like to get a opinion from medical modalities. 10/10/2015 -- the issue with her air mattress has not yet been sorted out and the new problem seems to be a lot of odor from the wound VAC. 11/27/2015 -- the patient was admitted to the hospital between July 23 and 10/31/2015. Her problems were sepsis, osteomyelitis of the pelvic bone and acute pyelonephritis. CT of the abdomen and pelvis was consistent with a left-sided pyelonephritis with hydronephrosis and also just showed new sclerosis of the posterior portion of  the left anterior pubic ramus suggestive of periosteal reaction consistent with osteomyelitis. She was treated for the osteomyelitis with infectious disease consult  recommending 6 weeks of IV antibiotics including vancomycin and Rocephin and the antibiotics were to go on until 12/10/2015. He was seen by Dr. Iran Planas plastic surgery and Dr. Linus Salmons of infectious disease. She had a suprapubic catheter placed during the admission. CT scan done on 10/28/2015 showed specifically -- New sclerosis of the posterior portion of the left inferior pubic ramus with aggressive periosteal reaction, consistent with osteomyelitis, with adjacent soft tissue gas compatible with previously described decubitus ulcer. 12/12/2015 -- she was recently seen by Dr. Linus Salmons, who noted good improvement and CRP and ESR compared to before and he has stopped her antibiotic as per plans to finish on September 4. The patient was encouraged to continue with wound care and consider hyperbaric oxygen therapy. Today she tells me that she has consented to undergo hyperbaric oxygen therapy and we can start the paperwork. 01/02/2016 -- her PCP had gained about 3 years but she still persists in having problems during hyperbaric oxygen therapy with some discomfort in the ears. 01/09/16; pressure area with underlying osteomyelitis in the left buttock. Wound bed itself has some slight amount of grayish surface slough however I do not think any debridement was necessary. There is no exposed bone soft tissue appears stable. She is using a wound VAC 01/16/16; back for weekly wound review in conjunction with HBO. She has a deep wound over the left initial tuberosity previously treated with 6 weeks of IV antibiotics for osteomyelitis. Wound bed looks reasonably healthy although the base of this is still precariously close to bone. She has been using a wound VAC. 01/23/2016 -- she has completed her course of antibiotics and this week the only new thing is her right great toe nail was avulsed and she has got an open wound over the nailbed. 01/31/16 she has completed her course of antibiotics. Her right great toenail  avulsed last week and she's been using silver alginate for this as well. 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 Did not sound like there was any optimistic discussion regarding reconstruction and the net result would be to continue with the wound VAC application. I will await the official reports. Addendum: -- she was seen at [facility] surgery [facility] by Dr. Tressa Busman. After a thorough review and from what I understand spending 45 minutes with the patient his assessment has been noted by me in detail and the management options were: 1. Continued pressure offloading and wound care versus operative procedures including wound excision 2. Soft tissue and bone sampling 3. If the wound gets larger wound closure would be done using a variety of plastic surgical techniques including but not limited to skin substitute, possible skin graft, local versus regional flaps, negative pressure dressing application. 4. He discussed with her details of flap surgery and the risks associated 5. He made a comment that since the patient was operated on by Dr. Leland Johns of [facility] plastic surgery unit in [place] the patient may continue to follow-up there for further evaluation for surgical flap closure in the future. 03/19/2016 -- the patient continues to be rather depressed and frustrated  with her lack of rapid progress in healing this wound especially because she thought after hyperbaric oxygen therapy the wound would heal extremely fast. She now understands that was not the implied benefit on wound care which was the recommendation for hyperbaric oxygen therapy. I have had a lengthy discussion with the patient and her husband regarding her  options: 1. Continue with collagen and wound VAC for the primary dressing and offloading and all supportive care. 2. See Dr. Iran Planas for possible placement of Acell or Integra in the OR. 3. get a second opinion from a wound care center and surrounding regions/counties 05/07/2016 -- Note from Dr. Celedonio Miyamoto, who noted that the patient has declined flap surgery. She has discussed application of A cell, and try a few applications to see how the wound progresses. She is also recommended that we could apply products here in the wound center, like Oasis. during her preop workup it was found that her hemoglobin A1c was 11% and she has now been diagnosed as having diabetes mellitus and has been put on appropriate treatment by her PCP 05/28/2016 -- tells me her blood sugars have been doing well and she has an appointment to see her PCP in the next couple of weeks to check her hemoglobin A1c. Other than that she continues to do well. 06/25/2016 -- have not seen her back for the last month but she says her health has been about the same and she has an appointment to check the A1c next week 09/10/16 ---- was seen by Dr. Celedonio Miyamoto -- who applied Acell and saw her back in follow-up. She has recommended silver alginate to the wound every other day and cover with foam. If no significant drainage could transition to collagen every other day. She recommended discontinuing wound VAC. There were no plans to repeat application of Acell. The patient expressed that her husband could do the wound care as going to the Wound Ctr., would cost several $100 for each visit. 10/21/2016 -- her insurance company is getting her new mattress and she is pleased about that. Other than that she has been doing dressings with PolyMem Silver and has been doing very well 02/18/2017 -- she has gone through several changes of her mattress and has not been pleased with any of them. The ventricles are still working on trying to  fit her with the appropriate low air-loss mattress. She has a new wound on the gluteal area which is clearly separated from the original wound. 03/25/17-she is here in follow-up evaluation for her left ischialpressure ulcer. She remains unsatisfied with her pressure mattress. She admits to sitting multiple hours a day, in the bed. We have discussed offloading options. The wound does not appear infected. Nutrition does not appear to be a concern. Will follow-up in 4 weeks, if wound continues to be stalled may consider x-ray to evaluate for refractory osteomyelitis. 04/21/17; this is a patient that I don't know all that well. She has a chronic wound which at one point had underlying osteomyelitis in the left ischial tuberosity. This is a stage IV pressure ulcer. Over the last 3 months she has a stage II wound inferiorly to the original wound. The last time she was here her dressing was changed to silver collagen although the patient's husband who changes the dressing said that the collagen stuck to the wound and remove skin from the superficial area therefore he switched back to Anderson 05/13/17; this is a patient we've been following for a left  ischial tuberosity wound which was stage IV at one point had underlying osteomyelitis. Over the last several months she's had a stage II wound just inferior and medial to the related to the wound. According to her husband he is using Endoform layer with collagen although this is not what I had last time. According to her husband they are using Elgie Congo with collagen although I don't quite know how that started. She was hospitalized from 1/20 through 04/30/16. This was related to a UTI. Her blood cultures were negative, urine culture showed multiple species. She did have a CT scan of the abdomen and pelvis which documented chronic osteomyelitis in the area of the wound inflammatory markers were unremarkable. She has had prior knowledge of osteomyelitis. It  looks as though she received IV antibiotics in 2017 and was treated with a course of hyperbaric oxygen. 05/28/17; the wound over the left ischial tuberosity is deeper today and abuts clearly on bone. Nursing intake reported drainage. I therefore culture of the wound. The more superficial area just below this looks about the same. They once again complained that there are mattress cover is not working although apparently [facility] is been noted to see this many times in the report is that the device is functional 06/18/17; the patient had a probing area on the left ischial tuberosity that was draining purulent fluid last time. This also clearly seemed to have open bone. Culture I did showed pansensitive pseudomonas including third generation cephalosporins. I treated this with cefdinir 300 twice a day for 10 days and things seem to have improved. She has a more superficial wound just underneath this area. Amazingly she has a new air mattress through [facility]. I think they gave this to her as a parking give. In any case this now works according to the patient may have something to do with why the areas are looking better. 07/09/17; the patient has a probing area in the left ischial tuberosity that still has some depth. However this is contracted in terms of the wound orifice although the depth is still roughly the same. There is no undermining. She also has the satellite wound which is more superficial. This appears to have a healthy surface we've been using silver collagen 08/06/17; the patient's wound is over the left ischial tuberosity and a satellite lesion just underneath this. The original wound was actually a deep stage 4 wound. We have made good progress in 2 months and there is no longer exposed bone here. 09/03/17; left ischial tuberosity actually appears to be quite healthy. I think we are making progress. No debridement is required. There is no surrounding erythema 10/01/17 I  follow this patient monthly for her left ischial tuberosity wound. There is 2 areas the original area and a satellite area. The satellite area looks a lot better there is no surrounding erythema. Her husband relates that he is having trouble maintaining the dressing. This has to do with the soft tissue around it. He states he puts the collagen in but he cannot make sure that it stays in even with the ABD pads and tape that he is been using 10/29/17; patient arrives with a better looking noon today. Some of the satellite lesions have closed. using Prisma 11/26/17; the patient has a large cone-shaped area with the tip of the Cone deep within her buttock soft tissue. The walls of the Cone are epithelialized however the base is still open. The area at the base of this looks  moist we've been using silver collagen. Will change to silver alginate 12/31/2017; the wound appears to have come in fairly nicely. Using silver alginate. There is no surrounding maceration or infection 01/28/18; there is still an open area here over the left initial tuberosity. Base of this however looks healthy. There is no surrounding infection 02/25/18; the area of its open is over the left ischial tuberosity. The base of this is where the wound is. This is a large inverted cone-shaped area with the wound at the tip. Dimensions of the wound at the tip are improved. There is a area of denuded skin about halfway towards the tip which her husband thinks may have happened today when he was bathing her. 04/20/17; the area is still open over the left initial tuberosity. 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 This is now completely closed over although it is closed over with a divot and skin over bone at the base of this. Continued aggressive  offloading will be necessary. 12/30/2018 READMISSION This is a 70 year old woman with chronic paraplegia. I picked her up for her care from Dr. Con Memos in this clinic after he departed. She had a stage IV pressure wound over the left ischial tuberosity. She was treated twice for her underlying osteomyelitis and this I believe firstly in 2016 and again in 2017. There were some plans at some point for flap closure of this however she was discovered to have uncontrolled diabetes and I do not think this was ever accomplished. She ultimately healed over in this clinic and was discharged in May. She has a large cone-shaped indentation with the tip of this going towards the left ischial tuberosity. It is not an easy area to examine but at that time I thought all of this was epithelialized. Apparently there was a reopening here shortly after she left the clinic last time. She was admitted to hospital at the end of June for Klebsiella bacteremia felt to be secondary to UTI. A CT scan of the pelvis is listed below and there was initially some concern that she had underlying osteomyelitis although I believe she was seen by infectious disease and that was felt to be not the case: I do not see any new cultures or inflammatory markers IMPRESSION: 1. No CT evidence for acute intra-abdominal or pelvic abnormality. Large volume of stool throughout the colon. 2. Enlarged fatty liver with fat sparing near the gallbladder fossa 3. Cortical scarring right kidney. Bilateral intrarenal stones without hydronephrosis. Thick-walled urinary bladder decompressed by suprapubic catheter 4. Deep left decubitus ulcer with underlying left ischial changes suggesting osteomyelitis. Her husband has been using silver collagen in the wound. She has not been systemically unwell no fever chills eating and drinking well. They rigorously offload this wound only getting up in the wheelchair when she is going to appointments the rest of the  time she is in bed. 10/8; wound measures larger and she now has exposed bone. We have been using silver alginate 11/12 still using silver alginate. The patient saw Dr. Megan Salon of infectious disease. She was started on Augmentin 500 mg twice daily. She is due to follow-up with Dr. Megan Salon I believe next week. Lab work Dr. Megan Salon requested showed a sedimentation rate of 28 and CRP of 20 although her CRP 1 year ago  was 18.8. Sedimentation rate 1 year ago was 11 basic metabolic panel showed a creatinine of 1.12 12/3; the patient followed up with Dr. Megan Salon yesterday. She is still on Augmentin twice daily. This was directed by Dr. Megan Salon. The patient's inflammatory markers have improved which is gratifying. Her C-reactive protein was repeated yesterday and follow-up booked with infectious disease in January. In addition I have been getting secure text messages I think from palliative care through [facility] [employer]. I think they were hoping to provide services to the patient in her home. They could not get a hold of the primary physician and so they reached out to me on 2 separate occasions. 12/17; patient last saw Dr. Megan Salon on 12/2. She is finishing up with Augmentin. Her C-reactive protein was 20 on 10/21, 10.1 on 11/19 and 17 on 12/2. The wound itself still has depth and undermining. We are using Santyl with the backing wet-to-dry 04/27/2019. The wound is gradually clearing up in terms of the surface although it is not filled in that much. Still abuts right against bone 2/4; patient with a deep pressure ulcer over the left ischial tuberosity. I thought she was going to follow-up with infectious disease to follow her inflammatory markers although the patient states that they stated that they did not need to see her unless we felt it was necessary. I will need to check their notes. In any case we ordered moistened silver collagen back with wet-to-dry to fill in the depth of  the wound although apparently [facility] sent silver alginate which they have been using since they were here the last time. Is obviously not what we ordered. 2/25. Not much change in this wound it is over the left ischial tuberosity recurrent wound. We have been using silver collagen with backing wet-to-dry. I think the wound is about the same. There is still some tunneling from about 10-12 o'clock over the ischial tuberosity itself 3/11; pressure ulcer over the left ischial tuberosity. Since she was last here the wound VAC was started and apparently going quite well. We are able to get the home health company that accepts [place] healthcare which is in itself sometimes problematic. There is been improvements in the wound the tunneling seems to be better and is contracted nicely 4/8; 1 month follow-up. Since she was last here we have been using silver collagen under a wound VAC. Some minor contraction I think in wound volume. She is cared for diligently by her husband including pressure relief, incontinence management, nutritional support etc. 6/1; this is almost a 26-monthfollow-up. She is been using silver collagen under wound VAC. Circular area over the left ischial tuberosity. She has been using silver collagen under wound VAC 7/8; 1 month follow-up. Silver collagen under the VAC not really a lot of progress. Tissue at the base of the wound which is right against bone and the tissue next that this does not look completely viable. She is not currently on any antibiotics, she had underlying osteomyelitis I need to look this over 8/16; we are using silver collagen under wound VAC to the left ischial tuberosity wound. Comes in today with absolutely no change in surface area or depth. There is no exposed bone. I did look over her infectious disease notes as I said I would do last time. She last saw Dr. CMegan Salonin December 2020. She completed 6 weeks of Augmentin. This was in response to a bone culture I  did showing methicillin susceptible staph aureus  and Enterococcus. She was supposed to come back to see Dr. Megan Salon at some point although they say that that appointment was canceled unless I chose to recommend return. I think there was supposed to be follow-up with inflammatory markers but I cannot see that that was ever done. She has not been on antibiotics since 9/21; monthly follow-up. We received a call from home health nurse last evening to report green drainage coming out of the wound. Lab work I ordered last time showed a white count of 5.2 a sedimentation rate of 45 and a C-reactive protein of 25 however neither one of the 2 values are substantially different from her previous values in October 2020 or December 2020. Both are slightly higher but only marginally. Otherwise no new complaints from the patient or her husband 10/19; 1 month follow-up. PCR culture I did last time showed medium quantities of Pseudomonas lower quantities Klebsiella and Enterococcus faecalis group B strep and Peptostreptococcus. I gave her Augmentin for 2 weeks. I am not really sure of my choice of this I would not cover Pseudomonas. She is still having green drainage. Wound itself looks satisfactory there is not a lot of depth wound bed looks healthy 11/16; patient has completed the antibiotics still using gentamicin and silver alginate on the wound. There is improvement in the surface area 12/21; in general the area on the buttock looks somewhat better. Surface looks healthy although I do not know that there is been much improvement in the wound volume. We have been using silver alginate and Hydrofera Blue. Less drainage. In passing the husband showed me an abrasion injury on the left anterior tibia. Covered in necrotic surface. He has noticed this for about a week and has been putting silver collagen on it. He is completely uncertain about how this happened 1/25; monthly follow-up. The area on the left buttock is  about the same. This does not go to bone but a fairly deep wound surface of the wound is of questionable viability. The abrasion injury that they showed me last time apparently was closed out by home health because they thought it was healed but certainly is not although it is just about healed. As a result they haven't been applying anything to this area Finally I did discuss with the patient and her husband the idea of an advanced treatment product to try and get a proper base to this wound I was thinking of Puraply however actually the patient points out that her co-pay for coming to visit [place] i.e. the facility, charge would be unaffordable if they have to, on a weekly basis 2/22; pressure ulcer on the left buttock appears deeper to me and abuts on the ischial tuberosity. I thought initially there was exposed bone but there is a rim of tissue over this area. She also has a superficial over the right anterior mid tibia. Been using silver collagen to these areas without much success. I have looked over the patient's past history with regards to the area on the left ischium. She did have underlying osteomyelitis here dating back I think to late 2020. She saw Dr. Megan Salon she received a 6-week course of oral antibiotics in response to a bone culture that I did. This does not appear to be infected but it certainly has not been improving in terms of granulation. I do not believe she has had any recent imaging studies Electronic Signature(s) Signed: 05/29/2020 5:22:30 PM By: Linton Ham MD Entered By: Linton Ham on 05/29/2020 12:26:23 --------------------------------------------------------------------------------  Physical Exam Details Patient Name: Date of Service: [name], [name] 05/29/2020 10:45 A M Medical Record Number: [id number] Patient Account Number: [id number] Date of Birth/Sex: Treating RN: Aug 24, 1950 (70 y.o. Benjaman Lobe Primary Care Provider: Other Clinician: [name] Referring Provider: Treating Provider/Extender: Tommy Rainwater in Treatment: 58 Constitutional Patient is hypertensive.. Pulse regular and within target range for patient.Marland Kitchen Respirations regular, non-labored and within target range.. Temperature is normal and within the target range for the patient.Marland Kitchen Appears in no distress. Notes Wound exam Left ischial tuberosity. Unfortunately this seems deeper to me. The tissues is right against the ischial tuberosity itself. There is no tunneling no purulence no soft tissue infection. No debridement was done. On the right anterior mid tibia a clean looking wound superficial. She has chronic venous skin changes Electronic Signature(s) Signed: 05/29/2020 5:22:30 PM By: Linton Ham MD Entered By: Linton Ham on 05/29/2020 12:31:09 -------------------------------------------------------------------------------- Physician Orders Details Patient Name: Date of Service: [name] 05/29/2020 10:45 A M Medical Record Number: [id number] Patient Account Number: [id number] Date of Birth/Sex: Treating RN: Jan 15, 1951 (70 y.o. [name], [name] Primary Care Provider: [name] Other Clinician: Referring Provider: Treating Provider/Extender: Tommy Rainwater in Treatment: 11 Verbal / Phone Orders: No Diagnosis Coding Follow-up Appointments Return appointment in 1 month. Bathing/ Shower/ Hygiene May shower and wash wound with soap and water. - with dressingchanges Edema Control - Lymphedema / SCD / Other Elevate legs to the level of the heart or above for 30 minutes daily and/or when sitting, a frequency of: Avoid standing for long periods of time. Off-Loading Turn and reposition every 2 hours Bray wound care orders this week; continue Home Health for wound care. May utilize formulary equivalent dressing for wound treatment orders unless otherwise specified. Wound Treatment Wound #12 - Ischial  Tuberosity Wound Laterality: Left Cleanser: Soap and Water [facility]) Every Other Day/30 Days Discharge Instructions: May shower and wash wound with dial antibacterial soap and water prior to dressing change. Cleanser: Wound Cleanser [facility]) Every Other Day/30 Days Discharge Instructions: Cleanse the wound with wound cleanser prior to applying a clean dressing using gauze sponges, not tissue or cotton balls. Peri-Wound Care: Skin Prep [facility]) Every Other Day/30 Days Discharge Instructions: Use skin prep as directed Prim Dressing: Hydrofera Blue Classic Foam, 4x4 in [facility]) Every Other Day/30 Days ary Discharge Instructions: Moisten with saline prior to applying to wound bed Secondary Dressing: Optifoam Non-Adhesive Dressing, 4x4 in [facility]) Every Other Day/30 Days Discharge Instructions: Apply over primary dressing as directed. Wound #13 - Lower Leg Wound Laterality: Right, Anterior, Distal Cleanser: Soap and Water [facility]) Every Other Day/30 Days Discharge Instructions: May shower and wash wound with dial antibacterial soap and water prior to dressing change. Cleanser: Wound Cleanser [facility]) Every Other Day/30 Days Discharge Instructions: Cleanse the wound with wound cleanser prior to applying a clean dressing using gauze sponges, not tissue or cotton balls. Peri-Wound Care: Skin Prep [facility]) Every Other Day/30 Days Discharge Instructions: Use skin prep as directed Prim Dressing: Hydrofera Blue Classic Foam, 4x4 in [facility]) Every Other Day/30 Days ary Discharge Instructions: Moisten with saline prior to applying to wound bed Secondary Dressing: Optifoam Non-Adhesive Dressing, 4x4 in [facility]) Every Other Day/30 Days Discharge Instructions: Apply over primary dressing as directed. Electronic Signature(s) Signed: 05/29/2020 5:22:30 PM By: Linton Ham MD Signed: 05/29/2020 5:43:17 PM By: Rhae Hammock RN Entered By: Rhae Hammock  on 05/29/2020 11:36:06 -------------------------------------------------------------------------------- Problem List Details Patient Name: Date of Service: DIERDRE, MCCALIP 05/29/2020 10:45 A M Medical Record Number: [id number] Patient Account Number: [id number] Date of Birth/Sex: Treating RN: 05-Jan-1951 (70 y.o. [name], [name] Primary Care Provider: [name] Other Clinician: Referring Provider: Treating Provider/Extender: Tommy Rainwater in Treatment: 32 Active Problems ICD-10 Encounter Code Description Active Date MDM Diagnosis [phone] Pressure ulcer of left buttock, stage 4 12/30/2018 No Yes E11.622 Type 2 diabetes mellitus with other skin ulcer 12/30/2018 No Yes S80.811D Abrasion, right lower leg, subsequent encounter 03/27/2020 No Yes L97.811 Non-pressure chronic ulcer of other part of right lower leg limited to breakdown 03/27/2020 No Yes of skin Inactive Problems ICD-10 Code Description Active Date Inactive Date G82.20 Paraplegia, unspecified 12/30/2018 12/30/2018 M86.68 Other chronic osteomyelitis, other site 02/17/2019 02/17/2019 Resolved Problems Electronic Signature(s) Signed: 05/29/2020 5:22:30 PM By: Linton Ham MD Entered By: Linton Ham on 05/29/2020 12:20:30 -------------------------------------------------------------------------------- Progress Note Details Patient Name: Date of Service: [name] 05/29/2020 10:45 A M Medical Record Number: [id number] Patient Account Number: [id number] Date of Birth/Sex: Treating RN: Jul 17, 1950 (70 y.o. [name], [name] Primary Care Provider: [name] Other Clinician: Referring Provider: Treating Provider/Extender: Tommy Rainwater in Treatment: 73 Subjective History of Present Illness (HPI) The following HPI elements were documented for the patient's wound: Location: open ulceration of the left gluteal area, left heel and right ankle for about 5 months. Quality:  Patient reports No Pain. Severity: Patient states wound(s) are getting worse. Duration: Patient has had the wound for > 5 months prior to seeking treatment at the wound center Context: The wound occurred when the patient has been paraplegic for about 3 years. Modifying Factors: Wound improving due to current treatment. Associated Signs and Symptoms: Patient reports having foul odor. this 70 year old patient who is known to have hypertension, hypothyroidism, breast cancer, chronic pain syndrome, paraplegia was noted to have a left gluteal decubitus ulcer and was brought into the hospital. During the course of her hospitalization she was debrided in the operating room by ankle wound. Bone cultures were taken at that time but were negative but clinically she was treated for osteomyelitis because of the probing down to bone and open exposed bone. Home health has been giving her antibioticss which include vancomycin and Zosyn. The patient was a smoker until about 3 weeks ago and used to smoke about 10 cigarettes a day for a long while. 12/13/2014 - details of her operative note from 11/03/2014 were reviewed -- PROCEDURE: 1. Excisional debridement skin, subcutaneous, muscle left ischium 35 cm2 2. Excisional debridement skin, subcutaneous tissue left heel 27 cm2 3. Excisional debridement right ankle skin, subcutaneous, bone 30 cm2 01/24/2015 -- she has some issues with her wheelchair cushion but other than that is doing very well and has received Podus boots for her feet. 02/14/2015 -- she was using her old offloading boots and this seemed to have caused her a new pressure ulcer on the left posterior heel near the superior part just below the Achilles tendon. 03/07/2015 -- she has a new ulceration just to the left of the midline on her sacral region more on the left buttock and this has been there for Dr. Leland Johns and had all the wounds sharply debrided. The debridement was done for the left ischial wound,  the left heel wound and the right about a week. 08/22/2015 -- was recently admitted to hospital between May 5 and 08/13/2015, with sepsis and leukocytosis  due to a UTI. she was treated for a sepsis complicating Escherichia coli UTI and kidney stones. She also had metabolic and careful up at the secondary to pyelonephritis. He received broad-spectrum antibiotics initially and then received Macrobid as per urology. She was sent home on nitrofurantoin. during her admission she had a CT scan which showed exposed left ischial tuberosity without evidence of osteolysis. 09/12/2015-- the patient is having some issues with her air mattress and would like to get a opinion from medical modalities. 10/10/2015 -- the issue with her air mattress has not yet been sorted out and the new problem seems to be a lot of odor from the wound VAC. 11/27/2015 -- the patient was admitted to the hospital between July 23 and 10/31/2015. Her problems were sepsis, osteomyelitis of the pelvic bone and acute pyelonephritis. CT of the abdomen and pelvis was consistent with a left-sided pyelonephritis with hydronephrosis and also just showed new sclerosis of the posterior portion of the left anterior pubic ramus suggestive of periosteal reaction consistent with osteomyelitis. She was treated for the osteomyelitis with infectious disease consult recommending 6 weeks of IV antibiotics including vancomycin and Rocephin and the antibiotics were to go on until 12/10/2015. He was seen by Dr. Iran Planas plastic surgery and Dr. Linus Salmons of infectious disease. She had a suprapubic catheter placed during the admission. CT scan done on 10/28/2015 showed specifically -- New sclerosis of the posterior portion of the left inferior pubic ramus with aggressive periosteal reaction, consistent with osteomyelitis, with adjacent soft tissue gas compatible with previously described decubitus ulcer. 12/12/2015 -- she was recently seen by Dr. Linus Salmons, who noted  good improvement and CRP and ESR compared to before and he has stopped her antibiotic as per plans to finish on September 4. The patient was encouraged to continue with wound care and consider hyperbaric oxygen therapy. Today she tells me that she has consented to undergo hyperbaric oxygen therapy and we can start the paperwork. 01/02/2016 -- her PCP had gained about 3 years but she still persists in having problems during hyperbaric oxygen therapy with some discomfort in the ears. 01/09/16; pressure area with underlying osteomyelitis in the left buttock. Wound bed itself has some slight amount of grayish surface slough however I do not think any debridement was necessary. There is no exposed bone soft tissue appears stable. She is using a wound VAC 01/16/16; back for weekly wound review in conjunction with HBO. She has a deep wound over the left initial tuberosity previously treated with 6 weeks of IV antibiotics for osteomyelitis. Wound bed looks reasonably healthy although the base of this is still precariously close to bone. She has been using a wound VAC. 01/23/2016 -- she has completed her course of antibiotics and this week the only new thing is her right great toe nail was avulsed and she has got an open wound over the nailbed. 01/31/16 she has completed her course of antibiotics. Her right great toenail avulsed last week and she's been using silver alginate for this as well. Still using a wound VAC to the substantial stage IV wound over the left ischial tuberosity 03/05/2016 -- the patient has had a opinion from the plastic surgery group at [facility] and details of this are not available yet but the patient's verbal report has been heard by me. Did not sound like there was any optimistic discussion regarding reconstruction and the net result would be to continue with the wound VAC application. I will await the official reports. Addendum: --  she was seen at [facility] health Plastic  surgery service by Dr. Tamela Oddi. After a thorough review and from what I understand spending 45 minutes with the patient his assessment has been noted by me in detail and the management options were: 1. Continued pressure offloading and wound care versus operative procedures including wound excision 2. Soft tissue and bone sampling 3. If the wound gets larger wound closure would be done using a variety of plastic surgical techniques including but not limited to skin substitute, possible skin graft, local versus regional flaps, negative pressure dressing application. 4. He discussed with her details of flap surgery and the risks associated 5. He made a comment that since the patient was operated on by Dr. Marzetta Board of [facility] plastic surgery unit in [place] the patient may continue to follow-up there for further evaluation for surgical flap closure in the future. 03/19/2016 -- the patient continues to be rather depressed and frustrated with her lack of rapid progress in healing this wound especially because she thought after hyperbaric oxygen therapy the wound would heal extremely fast. She now understands that was not the implied benefit on wound care which was the recommendation for hyperbaric oxygen therapy. I have had a lengthy discussion with the patient and her husband regarding her options: 1. Continue with collagen and wound VAC for the primary dressing and offloading and all supportive care. 2. See Dr. Leta Baptist for possible placement of Acell or Integra in the OR. 3. get a second opinion from a wound care center and surrounding regions/counties 05/07/2016 -- Note from Dr. Sherrilyn Rist, who noted that the patient has declined flap surgery. She has discussed application of A cell, and try a few applications to see how the wound progresses. She is also recommended that we could apply products here in the wound center, like Oasis. during her preop workup it was found  that her hemoglobin A1c was 11% and she has now been diagnosed as having diabetes mellitus and has been put on appropriate treatment by her PCP 05/28/2016 -- tells me her blood sugars have been doing well and she has an appointment to see her PCP in the next couple of weeks to check her hemoglobin A1c. Other than that she continues to do well. 06/25/2016 -- have not seen her back for the last month but she says her health has been about the same and she has an appointment to check the A1c next week 09/10/16 ---- was seen by Dr. Sherrilyn Rist -- who applied Acell and saw her back in follow-up. She has recommended silver alginate to the wound every other day and cover with foam. If no significant drainage could transition to collagen every other day. She recommended discontinuing wound VAC. There were no plans to repeat application of Acell. The patient expressed that her husband could do the wound care as going to the Wound Ctr., would cost several $100 for each visit. 10/21/2016 -- her insurance company is getting her new mattress and she is pleased about that. Other than that she has been doing dressings with PolyMem Silver and has been doing very well 02/18/2017 -- she has gone through several changes of her mattress and has not been pleased with any of them. The ventricles are still working on trying to fit her with the appropriate low air-loss mattress. She has a new wound on the gluteal area which is clearly separated from the original wound. 03/25/17-she is here in follow-up evaluation for her left  ischialpressure ulcer. She remains unsatisfied with her pressure mattress. She admits to sitting multiple hours a day, in the bed. We have discussed offloading options. The wound does not appear infected. Nutrition does not appear to be a concern. Will follow-up in 4 weeks, if wound continues to be stalled may consider x-ray to evaluate for refractory osteomyelitis. 04/21/17; this is a patient that I  don't know all that well. She has a chronic wound which at one point had underlying osteomyelitis in the left ischial tuberosity. This is a stage IV pressure ulcer. Over the last 3 months she has a stage II wound inferiorly to the original wound. The last time she was here her dressing was changed to silver collagen although the patient's husband who changes the dressing said that the collagen stuck to the wound and remove skin from the superficial area therefore he switched back to [place]art 05/13/17; this is a patient we've been following for a left ischial tuberosity wound which was stage IV at one point had underlying osteomyelitis. Over the last several months she's had a stage II wound just inferior and medial to the related to the wound. According to her husband he is using Endoform layer with collagen although this is not what I had last time. According to her husband they are using Elgie Congo with collagen although I don't quite know how that started. She was hospitalized from 1/20 through 04/30/16. This was related to a UTI. Her blood cultures were negative, urine culture showed multiple species. She did have a CT scan of the abdomen and pelvis which documented chronic osteomyelitis in the area of the wound inflammatory markers were unremarkable. She has had prior knowledge of osteomyelitis. It looks as though she received IV antibiotics in 2017 and was treated with a course of hyperbaric oxygen. 05/28/17; the wound over the left ischial tuberosity is deeper today and abuts clearly on bone. Nursing intake reported drainage. I therefore culture of the wound. The more superficial area just below this looks about the same. They once again complained that there are mattress cover is not working although apparently [facility] is been noted to see this many times in the report is that the device is functional 06/18/17; the patient had a probing area on the left ischial tuberosity that was  draining purulent fluid last time. This also clearly seemed to have open bone. Culture I did showed pansensitive pseudomonas including third generation cephalosporins. I treated this with cefdinir 300 twice a day for 10 days and things seem to have improved. She has a more superficial wound just underneath this area. Amazingly she has a new air mattress through [facility]. I think they gave this to her as a parking give. In any case this now works according to the patient may have something to do with why the areas are looking better. 07/09/17; the patient has a probing area in the left ischial tuberosity that still has some depth. However this is contracted in terms of the wound orifice although the depth is still roughly the same. There is no undermining. ooShe also has the satellite wound which is more superficial. This appears to have a healthy surface we've been using silver collagen 08/06/17; the patient's wound is over the left ischial tuberosity and a satellite lesion just underneath this. The original wound was actually a deep stage 4 wound. We have made good progress in 2 months and there is no longer exposed bone here. 09/03/17; left ischial tuberosity  actually appears to be quite healthy. I think we are making progress. No debridement is required. There is no surrounding erythema 10/01/17 I follow this patient monthly for her left ischial tuberosity wound. There is 2 areas the original area and a satellite area. The satellite area looks a lot better there is no surrounding erythema. Her husband relates that he is having trouble maintaining the dressing. This has to do with the soft tissue around it. He states he puts the collagen in but he cannot make sure that it stays in even with the ABD pads and tape that he is been using 10/29/17; patient arrives with a better looking noon today. Some of the satellite lesions have closed. using Prisma 11/26/17; the patient has a large cone-shaped  area with the tip of the Cone deep within her buttock soft tissue. The walls of the Cone are epithelialized however the base is still open. The area at the base of this looks moist we've been using silver collagen. Will change to silver alginate 12/31/2017; the wound appears to have come in fairly nicely. Using silver alginate. There is no surrounding maceration or infection 01/28/18; there is still an open area here over the left initial tuberosity. Base of this however looks healthy. There is no surrounding infection 02/25/18; the area of its open is over the left ischial tuberosity. The base of this is where the wound is. This is a large inverted cone-shaped area with the wound at the tip. Dimensions of the wound at the tip are improved. There is a area of denuded skin about halfway towards the tip which her husband thinks may have happened today when he was bathing her. 04/20/17; the area is still open over the left initial tuberosity. 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 This is now completely closed over although it is closed over with a divot and skin over bone at the base of this. Continued aggressive offloading will be necessary. 12/30/2018 READMISSION This is a 70 year old woman with chronic paraplegia. I picked her up for her care from Dr. Con Memos in this clinic after he departed. She had a stage IV pressure wound over the left ischial tuberosity. She was treated twice for her underlying osteomyelitis and this I believe firstly in 2016 and again in 2017. There were some plans at some point for flap closure of this however she was discovered to have uncontrolled diabetes and I do not think this was ever accomplished. She ultimately healed over in this clinic and was discharged in May. She has a  large cone-shaped indentation with the tip of this going towards the left ischial tuberosity. It is not an easy area to examine but at that time I thought all of this was epithelialized. Apparently there was a reopening here shortly after she left the clinic last time. She was admitted to hospital at the end of June for Klebsiella bacteremia felt to be secondary to UTI. A CT scan of the pelvis is listed below and there was initially some concern that she had underlying osteomyelitis although I believe she was seen by infectious disease and that was felt to be not the case: I do not see any new cultures or inflammatory markers IMPRESSION: 1. No CT evidence for acute intra-abdominal or pelvic abnormality. Large volume of  stool throughout the colon. 2. Enlarged fatty liver with fat sparing near the gallbladder fossa 3. Cortical scarring right kidney. Bilateral intrarenal stones without hydronephrosis. Thick-walled urinary bladder decompressed by suprapubic catheter 4. Deep left decubitus ulcer with underlying left ischial changes suggesting osteomyelitis. Her husband has been using silver collagen in the wound. She has not been systemically unwell no fever chills eating and drinking well. They rigorously offload this wound only getting up in the wheelchair when she is going to appointments the rest of the time she is in bed. 10/8; wound measures larger and she now has exposed bone. We have been using silver alginate 11/12 still using silver alginate. The patient saw Dr. Megan Salon of infectious disease. She was started on Augmentin 500 mg twice daily. She is due to follow-up with Dr. Megan Salon I believe next week. Lab work Dr. Megan Salon requested showed a sedimentation rate of 28 and CRP of 20 although her CRP 1 year ago was 18.8. Sedimentation rate 1 year ago was 11 basic metabolic panel showed a creatinine of 1.12 12/3; the patient followed up with Dr. Megan Salon yesterday. She is still on Augmentin  twice daily. This was directed by Dr. Megan Salon. The patient's inflammatory markers have improved which is gratifying. Her C-reactive protein was repeated yesterday and follow-up booked with infectious disease in January. In addition I have been getting secure text messages I think from palliative care [facility] [employer]. I think they were hoping to provide services to the patient in her home. They could not get a hold of the primary physician and so they reached out to me on 2 separate occasions. 12/17; patient last saw Dr. Megan Salon on 12/2. She is finishing up with Augmentin. Her C-reactive protein was 20 on 10/21, 10.1 on 11/19 and 17 on 12/2. The wound itself still has depth and undermining. We are using Santyl with the backing wet-to-dry 04/27/2019. The wound is gradually clearing up in terms of the surface although it is not filled in that much. Still abuts right against bone 2/4; patient with a deep pressure ulcer over the left ischial tuberosity. I thought she was going to follow-up with infectious disease to follow her inflammatory markers although the patient states that they stated that they did not need to see her unless we felt it was necessary. I will need to check their notes. In any case we ordered moistened silver collagen back with wet-to-dry to fill in the depth of the wound although apparently [facility] sent silver alginate which they have been using since they were here the last time. Is obviously not what we ordered. 2/25. Not much change in this wound it is over the left ischial tuberosity recurrent wound. We have been using silver collagen with backing wet-to-dry. I think the wound is about the same. There is still some tunneling from about 10-12 o'clock over the ischial tuberosity itself 3/11; pressure ulcer over the left ischial tuberosity. Since she was last here the wound VAC was started and apparently going quite well. We are able to get the home  health company that accepts [place] healthcare which is in itself sometimes problematic. There is been improvements in the wound the tunneling seems to be better and is contracted nicely 4/8; 1 month follow-up. Since she was last here we have been using silver collagen under a wound VAC. Some minor contraction I think in wound volume. She is cared for diligently by her husband including pressure relief, incontinence management, nutritional support etc.  6/1; this is almost a 71-month follow-up. She is been using silver collagen under wound VAC. Circular area over the left ischial tuberosity. She has been using silver collagen under wound VAC 7/8; 1 month follow-up. Silver collagen under the VAC not really a lot of progress. Tissue at the base of the wound which is right against bone and the tissue next that this does not look completely viable. She is not currently on any antibiotics, she had underlying osteomyelitis I need to look this over 8/16; we are using silver collagen under wound VAC to the left ischial tuberosity wound. Comes in today with absolutely no change in surface area or depth. There is no exposed bone. I did look over her infectious disease notes as I said I would do last time. She last saw Dr. Megan Salon in December 2020. She completed 6 weeks of Augmentin. This was in response to a bone culture I did showing methicillin susceptible staph aureus and Enterococcus. She was supposed to come back to see Dr. Megan Salon at some point although they say that that appointment was canceled unless I chose to recommend return. I think there was supposed to be follow-up with inflammatory markers but I cannot see that that was ever done. She has not been on antibiotics since 9/21; monthly follow-up. We received a call from home health nurse last evening to report green drainage coming out of the wound. Lab work I ordered last time showed a white count of 5.2 a sedimentation rate of 45 and a C-reactive  protein of 25 however neither one of the 2 values are substantially different from her previous values in October 2020 or December 2020. Both are slightly higher but only marginally. Otherwise no new complaints from the patient or her husband 10/19; 1 month follow-up. PCR culture I did last time showed medium quantities of Pseudomonas lower quantities Klebsiella and Enterococcus faecalis group B strep and Peptostreptococcus. I gave her Augmentin for 2 weeks. I am not really sure of my choice of this I would not cover Pseudomonas. She is still having green drainage. Wound itself looks satisfactory there is not a lot of depth wound bed looks healthy 11/16; patient has completed the antibiotics still using gentamicin and silver alginate on the wound. There is improvement in the surface area 12/21; in general the area on the buttock looks somewhat better. Surface looks healthy although I do not know that there is been much improvement in the wound volume. We have been using silver alginate and Hydrofera Blue. Less drainage. In passing the husband showed me an abrasion injury on the left anterior tibia. Covered in necrotic surface. He has noticed this for about a week and has been putting silver collagen on it. He is completely uncertain about how this happened 1/25; monthly follow-up. The area on the left buttock is about the same. This does not go to bone but a fairly deep wound surface of the wound is of questionable viability. The abrasion injury that they showed me last time apparently was closed out by home health because they thought it was healed but certainly is not although it is just about healed. As a result they haven't been applying anything to this area Finally I did discuss with the patient and her husband the idea of an advanced treatment product to try and get a proper base to this wound I was thinking of Puraply however actually the patient points out that her co-pay for coming to visit  [place] the  facility, charge would be unaffordable if they have to, on a weekly basis 2/22; pressure ulcer on the left buttock appears deeper to me and abuts on the ischial tuberosity. I thought initially there was exposed bone but there is a rim of tissue over this area. She also has a superficial over the right anterior mid tibia. Been using silver collagen to these areas without much success. I have looked over the patient's past history with regards to the area on the left ischium. She did have underlying osteomyelitis here dating back I think to late 2020. She saw Dr. Megan Salon she received a 6-week course of oral antibiotics in response to a bone culture that I did. This does not appear to be infected but it certainly has not been improving in terms of granulation. I do not believe she has had any recent imaging studies Objective Constitutional Patient is hypertensive.. Pulse regular and within target range for patient.Marland Kitchen Respirations regular, non-labored and within target range.. Temperature is normal and within the target range for the patient.Marland Kitchen Appears in no distress. Vitals Time Taken: 10:52 AM, Height: 63 in, Source: Stated, Weight: 185 lbs, Source: Stated, BMI: 32.8, Temperature: 98.3 F, Pulse: 105 bpm, Respiratory Rate: 18 breaths/min, Blood Pressure: 159/87 mmHg. General Notes: Wound exam ooLeft ischial tuberosity. Unfortunately this seems deeper to me. The tissues is right against the ischial tuberosity itself. There is no tunneling no purulence no soft tissue infection. No debridement was done. ooOn the right anterior mid tibia a clean looking wound superficial. She has chronic venous skin changes Integumentary (Hair, Skin) Wound #12 status is Open. Original cause of wound was Pressure Injury. The date acquired was: 09/06/2018. The wound has been in treatment 73 weeks. The wound is located on the Left Ischial Tuberosity. The wound measures 3cm length x 1.1cm width x 1.3cm depth;  2.592cm^2 area and 3.369cm^3 volume. There is Fat Layer (Subcutaneous Tissue) exposed. There is no tunneling or undermining noted. There is a medium amount of serosanguineous drainage noted. The wound margin is well defined and not attached to the wound base. There is large (67-100%) red, pink granulation within the wound bed. There is no necrotic tissue within the wound bed. Wound #13 status is Open. Original cause of wound was Trauma. The date acquired was: 03/23/2020. The wound has been in treatment 9 weeks. The wound is located on the Right,Distal,Anterior Lower Leg. The wound measures 1.1cm length x 0.8cm width x 0.1cm depth; 0.691cm^2 area and 0.069cm^3 volume. There is Fat Layer (Subcutaneous Tissue) exposed. There is no tunneling or undermining noted. There is a none present amount of drainage noted. The wound margin is flat and intact. There is large (67-100%) pink granulation within the wound bed. There is no necrotic tissue within the wound bed. 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 Bathing/ Shower/ Hygiene: May shower and wash wound with soap and water. - with dressingchanges Edema Control - Lymphedema / SCD / Other: Elevate legs to the level of the heart or above for 30 minutes daily and/or when sitting, a frequency of: Avoid standing for long periods of time. Off-Loading: Turn and reposition every 2 hours Home Health: New wound care orders this week; continue Home Health for wound care. May utilize formulary equivalent dressing for  wound treatment orders unless otherwise specified. WOUND #12: - Ischial Tuberosity Wound Laterality: Left Cleanser: Soap and Water [facility]) Every Other Day/30  Days Discharge Instructions: May shower and wash wound with dial antibacterial soap and water prior to dressing change. Cleanser: Wound Cleanser [facility] Every Other Day/30 Days Discharge Instructions: Cleanse the wound with wound cleanser prior to applying a clean dressing using gauze sponges, not tissue or cotton balls. Peri-Wound Care: Skin Prep [facility]) Every Other Day/30 Days Discharge Instructions: Use skin prep as directed Prim Dressing: Hydrofera Blue Classic Foam, 4x4 in [facility]) Every Other Day/30 Days ary Discharge Instructions: Moisten with saline prior to applying to wound bed Secondary Dressing: Optifoam Non-Adhesive Dressing, 4x4 in [facility]) Every Other Day/30 Days Discharge Instructions: Apply over primary dressing as directed. WOUND #13: - Lower Leg Wound Laterality: Right, Anterior, Distal Cleanser: Soap and Water [facility]) Every Other Day/30 Days Discharge Instructions: May shower and wash wound with dial antibacterial soap and water prior to dressing change. Cleanser: Wound Cleanser [facility]) Every Other Day/30 Days Discharge Instructions: Cleanse the wound with wound cleanser prior to applying a clean dressing using gauze sponges, not tissue or cotton balls. Peri-Wound Care: Skin Prep [facility]) Every Other Day/30 Days Discharge Instructions: Use skin prep as directed Prim Dressing: Hydrofera Blue Classic Foam, 4x4 in [facility]) Every Other Day/30 Days ary Discharge Instructions: Moisten with saline prior to applying to wound bed Secondary Dressing: Optifoam Non-Adhesive Dressing, 4x4 in [facility]) Every Other Day/30 Days Discharge Instructions: Apply over primary dressing as directed. 1. I change the primary dressing to [facility] Blue classic to both wound areas 2. I wonder about reconsidering the status of the bone underneath. She has had osteomyelitis in the past 3. Once again I have talked to them about an advanced treatment  product and they seem very ambivalent about it Electronic Signature(s) Signed: 05/29/2020 5:22:30 PM By: Linton Ham MD Entered By: Linton Ham on 05/29/2020 12:32:18 -------------------------------------------------------------------------------- SuperBill Details Patient Name: Date of Service: [name] 05/29/2020 Medical Record Number: [id number] Patient Account Number: [id number] Date of Birth/Sex: Treating RN: 11-26-1950 (70 y.o. [name], [name] Primary Care Provider: [name] Other Clinician: Referring Provider: Treating Provider/Extender: Tommy Rainwater in Treatment: 71 Diagnosis Coding ICD-10 Codes Code Description [phone] Pressure ulcer of left buttock, stage 4 E11.622 Type 2 diabetes mellitus with other skin ulcer S80.811D Abrasion, right lower leg, subsequent encounter L97.811 Non-pressure chronic ulcer of other part of right lower leg limited to breakdown of skin Facility Procedures The patient participates with Medicare or their insurance follows the Medicare Facility Guidelines: CPT4 Code Description Modifier Quantity [id number] [place] VISIT-LEV 4 EST PT 1 Physician Procedures : CPT4 Code Description Modifier [id number] - WC PHYS LEVEL 3 - EST PT ICD-10 Diagnosis Description 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, stage 4 Quantity: 1 Electronic Signature(s) Signed: 05/29/2020 5:22:30 PM By: Linton Ham MD Entered By: Linton Ham on 05/29/2020 12:32:38

## 2020-06-26 ENCOUNTER — Encounter (HOSPITAL_BASED_OUTPATIENT_CLINIC_OR_DEPARTMENT_OTHER): Payer: Medicare Other | Admitting: Internal Medicine

## 2020-07-03 ENCOUNTER — Other Ambulatory Visit: Payer: Self-pay

## 2020-07-03 ENCOUNTER — Encounter (HOSPITAL_BASED_OUTPATIENT_CLINIC_OR_DEPARTMENT_OTHER): Payer: Medicare Other | Attending: Internal Medicine | Admitting: Internal Medicine

## 2020-07-05 NOTE — Progress Notes (Signed)
[name], [name] ([id number]) Visit Report for 07/03/2020 HPI Details Patient Name: Date of Service: [name], [name]. 07/03/2020 11:00 [place] Record Number: [id number] Patient Account Number: [id number] Date of Birth/Sex: Treating RN: 14-Mar-1951 (70 y.o. [name], [name] Primary Care Provider: [name] Other Clinician: Referring Provider: Treating Provider/Extender: Tommy Rainwater in Treatment: 69 History of Present Illness Location: open ulceration of the left gluteal area, left heel and right ankle for about 5 months. Quality: Patient reports No Pain. Severity: Patient states wound(s) are getting worse. Duration: Patient has had the wound for > 5 months prior to seeking treatment at the wound center Context: The wound occurred when the patient has been paraplegic for about 3 years. Modifying Factors: Wound improving due to current treatment. ssociated Signs and Symptoms: Patient reports having foul odor. A HPI Description: this 70 year old patient who is known to have hypertension, hypothyroidism, breast cancer, chronic pain syndrome, paraplegia was noted to have a left gluteal decubitus ulcer and was brought into the hospital. During the course of her hospitalization she was debrided in the operating room by ankle wound. Bone cultures were taken at that time but were negative but clinically she was treated for osteomyelitis because of the probing down to bone and open exposed bone. Home health has been giving her antibioticss which include vancomycin and Zosyn. The patient was a smoker until about 3 weeks ago and used to smoke about 10 cigarettes a day for a long while. 12/13/2014 - details of her operative note from 11/03/2014 were reviewed -- PROCEDURE: 1. Excisional debridement skin, subcutaneous, muscle left ischium 35 cm2 2. Excisional debridement skin, subcutaneous tissue left heel 27 cm2 3. Excisional debridement right ankle skin, subcutaneous, bone  30 cm2 01/24/2015 -- she has some issues with her wheelchair cushion but other than that is doing very well and has received Podus boots for her feet. 02/14/2015 -- she was using her old offloading boots and this seemed to have caused her a new pressure ulcer on the left posterior heel near the superior part just below the Achilles tendon. 03/07/2015 -- she has a new ulceration just to the left of the midline on her sacral region more on the left buttock and this has been there for Dr. Leland Johns and had all the wounds sharply debrided. The debridement was done for the left ischial wound, the left heel wound and the right about a week. 08/22/2015 -- was recently admitted to hospital between May 5 and 08/13/2015, with sepsis and leukocytosis due to a UTI. she was treated for a sepsis complicating Escherichia coli UTI and kidney stones. She also had metabolic and careful up at the secondary to pyelonephritis. He received broad-spectrum antibiotics initially and then received Macrobid as per urology. She was sent home on nitrofurantoin. during her admission she had a CT scan which showed exposed left ischial tuberosity without evidence of osteolysis. 09/12/2015-- the patient is having some issues with her air mattress and would like to get a opinion from medical modalities. 10/10/2015 -- the issue with her air mattress has not yet been sorted out and the new problem seems to be a lot of odor from the wound VAC. 11/27/2015 -- the patient was admitted to the hospital between July 23 and 10/31/2015. Her problems were sepsis, osteomyelitis of the pelvic bone and acute pyelonephritis. CT of the abdomen and pelvis was consistent with a left-sided pyelonephritis with hydronephrosis and also just showed new sclerosis of the posterior portion of  the left anterior pubic ramus suggestive of periosteal reaction consistent with osteomyelitis. She was treated for the osteomyelitis with infectious disease consult  recommending 6 weeks of IV antibiotics including vancomycin and Rocephin and the antibiotics were to go on until 12/10/2015. He was seen by Dr. Iran Planas plastic surgery and Dr. Linus Salmons of infectious disease. She had a suprapubic catheter placed during the admission. CT scan done on 10/28/2015 showed specifically -- New sclerosis of the posterior portion of the left inferior pubic ramus with aggressive periosteal reaction, consistent with osteomyelitis, with adjacent soft tissue gas compatible with previously described decubitus ulcer. 12/12/2015 -- she was recently seen by Dr. Linus Salmons, who noted good improvement and CRP and ESR compared to before and he has stopped her antibiotic as per plans to finish on September 4. The patient was encouraged to continue with wound care and consider hyperbaric oxygen therapy. Today she tells me that she has consented to undergo hyperbaric oxygen therapy and we can start the paperwork. 01/02/2016 -- her PCP had gained about 3 years but she still persists in having problems during hyperbaric oxygen therapy with some discomfort in the ears. 01/09/16; pressure area with underlying osteomyelitis in the left buttock. Wound bed itself has some slight amount of grayish surface slough however I do not think any debridement was necessary. There is no exposed bone soft tissue appears stable. She is using a wound VAC 01/16/16; back for weekly wound review in conjunction with HBO. She has a deep wound over the left initial tuberosity previously treated with 6 weeks of IV antibiotics for osteomyelitis. Wound bed looks reasonably healthy although the base of this is still precariously close to bone. She has been using a wound VAC. 01/23/2016 -- she has completed her course of antibiotics and this week the only new thing is her right great toe nail was avulsed and she has got an open wound over the nailbed. 01/31/16 she has completed her course of antibiotics. Her right great toenail  avulsed last week and she's been using silver alginate for this as well. 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 Did not sound like there was any optimistic discussion regarding reconstruction and the net result would be to continue with the wound VAC application. I will await the official reports. Addendum: -- she was seen at [facility] surgery [facility] by Dr. Tressa Busman. After a thorough review and from what I understand spending 45 minutes with the patient his assessment has been noted by me in detail and the management options were: 1. Continued pressure offloading and wound care versus operative procedures including wound excision 2. Soft tissue and bone sampling 3. If the wound gets larger wound closure would be done using a variety of plastic surgical techniques including but not limited to skin substitute, possible skin graft, local versus regional flaps, negative pressure dressing application. 4. He discussed with her details of flap surgery and the risks associated 5. He made a comment that since the patient was operated on by Dr. Leland Johns of [facility] plastic surgery unit in [place] the patient may continue to follow-up there for further evaluation for surgical flap closure in the future. 03/19/2016 -- the patient continues to be rather depressed and frustrated  with her lack of rapid progress in healing this wound especially because she thought after hyperbaric oxygen therapy the wound would heal extremely fast. She now understands that was not the implied benefit on wound care which was the recommendation for hyperbaric oxygen therapy. I have had a lengthy discussion with the patient and her husband regarding her  options: 1. Continue with collagen and wound VAC for the primary dressing and offloading and all supportive care. 2. See Dr. Iran Planas for possible placement of Acell or Integra in the OR. 3. get a second opinion from a wound care center and surrounding regions/counties 05/07/2016 -- Note from Dr. Celedonio Miyamoto, who noted that the patient has declined flap surgery. She has discussed application of A cell, and try a few applications to see how the wound progresses. She is also recommended that we could apply products here in the wound center, like Oasis. during her preop workup it was found that her hemoglobin A1c was 11% and she has now been diagnosed as having diabetes mellitus and has been put on appropriate treatment by her PCP 05/28/2016 -- tells me her blood sugars have been doing well and she has an appointment to see her PCP in the next couple of weeks to check her hemoglobin A1c. Other than that she continues to do well. 06/25/2016 -- have not seen her back for the last month but she says her health has been about the same and she has an appointment to check the A1c next week 09/10/16 ---- was seen by Dr. Celedonio Miyamoto -- who applied Acell and saw her back in follow-up. She has recommended silver alginate to the wound every other day and cover with foam. If no significant drainage could transition to collagen every other day. She recommended discontinuing wound VAC. There were no plans to repeat application of Acell. The patient expressed that her husband could do the wound care as going to the Wound Ctr., would cost several $100 for each visit. 10/21/2016 -- her insurance company is getting her new mattress and she is pleased about that. Other than that she has been doing dressings with PolyMem Silver and has been doing very well 02/18/2017 -- she has gone through several changes of her mattress and has not been pleased with any of them. The ventricles are still working on trying to  fit her with the appropriate low air-loss mattress. She has a new wound on the gluteal area which is clearly separated from the original wound. 03/25/17-she is here in follow-up evaluation for her left ischialpressure ulcer. She remains unsatisfied with her pressure mattress. She admits to sitting multiple hours a day, in the bed. We have discussed offloading options. The wound does not appear infected. Nutrition does not appear to be a concern. Will follow-up in 4 weeks, if wound continues to be stalled may consider x-ray to evaluate for refractory osteomyelitis. 04/21/17; this is a patient that I don't know all that well. She has a chronic wound which at one point had underlying osteomyelitis in the left ischial tuberosity. This is a stage IV pressure ulcer. Over the last 3 months she has a stage II wound inferiorly to the original wound. The last time she was here her dressing was changed to silver collagen although the patient's husband who changes the dressing said that the collagen stuck to the wound and remove skin from the superficial area therefore he switched back to Anderson 05/13/17; this is a patient we've been following for a left  ischial tuberosity wound which was stage IV at one point had underlying osteomyelitis. Over the last several months she's had a stage II wound just inferior and medial to the related to the wound. According to her husband he is using Endoform layer with collagen although this is not what I had last time. According to her husband they are using Elgie Congo with collagen although I don't quite know how that started. She was hospitalized from 1/20 through 04/30/16. This was related to a UTI. Her blood cultures were negative, urine culture showed multiple species. She did have a CT scan of the abdomen and pelvis which documented chronic osteomyelitis in the area of the wound inflammatory markers were unremarkable. She has had prior knowledge of osteomyelitis. It  looks as though she received IV antibiotics in 2017 and was treated with a course of hyperbaric oxygen. 05/28/17; the wound over the left ischial tuberosity is deeper today and abuts clearly on bone. Nursing intake reported drainage. I therefore culture of the wound. The more superficial area just below this looks about the same. They once again complained that there are mattress cover is not working although apparently [facility] is been noted to see this many times in the report is that the device is functional 06/18/17; the patient had a probing area on the left ischial tuberosity that was draining purulent fluid last time. This also clearly seemed to have open bone. Culture I did showed pansensitive pseudomonas including third generation cephalosporins. I treated this with cefdinir 300 twice a day for 10 days and things seem to have improved. She has a more superficial wound just underneath this area. Amazingly she has a new air mattress through [facility]. I think they gave this to her as a parking give. In any case this now works according to the patient may have something to do with why the areas are looking better. 07/09/17; the patient has a probing area in the left ischial tuberosity that still has some depth. However this is contracted in terms of the wound orifice although the depth is still roughly the same. There is no undermining. She also has the satellite wound which is more superficial. This appears to have a healthy surface we've been using silver collagen 08/06/17; the patient's wound is over the left ischial tuberosity and a satellite lesion just underneath this. The original wound was actually a deep stage 4 wound. We have made good progress in 2 months and there is no longer exposed bone here. 09/03/17; left ischial tuberosity actually appears to be quite healthy. I think we are making progress. No debridement is required. There is no surrounding erythema 10/01/17 I  follow this patient monthly for her left ischial tuberosity wound. There is 2 areas the original area and a satellite area. The satellite area looks a lot better there is no surrounding erythema. Her husband relates that he is having trouble maintaining the dressing. This has to do with the soft tissue around it. He states he puts the collagen in but he cannot make sure that it stays in even with the ABD pads and tape that he is been using 10/29/17; patient arrives with a better looking noon today. Some of the satellite lesions have closed. using Prisma 11/26/17; the patient has a large cone-shaped area with the tip of the Cone deep within her buttock soft tissue. The walls of the Cone are epithelialized however the base is still open. The area at the base of this looks  moist we've been using silver collagen. Will change to silver alginate 12/31/2017; the wound appears to have come in fairly nicely. Using silver alginate. There is no surrounding maceration or infection 01/28/18; there is still an open area here over the left initial tuberosity. Base of this however looks healthy. There is no surrounding infection 02/25/18; the area of its open is over the left ischial tuberosity. The base of this is where the wound is. This is a large inverted cone-shaped area with the wound at the tip. Dimensions of the wound at the tip are improved. There is a area of denuded skin about halfway towards the tip which her husband thinks may have happened today when he was bathing her. 04/20/17; the area is still open over the left initial tuberosity. 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 This is now completely closed over although it is closed over with a divot and skin over bone at the base of this. Continued aggressive  offloading will be necessary. 12/30/2018 READMISSION This is a 70 year old woman with chronic paraplegia. I picked her up for her care from Dr. Con Memos in this clinic after he departed. She had a stage IV pressure wound over the left ischial tuberosity. She was treated twice for her underlying osteomyelitis and this I believe firstly in 2016 and again in 2017. There were some plans at some point for flap closure of this however she was discovered to have uncontrolled diabetes and I do not think this was ever accomplished. She ultimately healed over in this clinic and was discharged in May. She has a large cone-shaped indentation with the tip of this going towards the left ischial tuberosity. It is not an easy area to examine but at that time I thought all of this was epithelialized. Apparently there was a reopening here shortly after she left the clinic last time. She was admitted to hospital at the end of June for Klebsiella bacteremia felt to be secondary to UTI. A CT scan of the pelvis is listed below and there was initially some concern that she had underlying osteomyelitis although I believe she was seen by infectious disease and that was felt to be not the case: I do not see any new cultures or inflammatory markers IMPRESSION: 1. No CT evidence for acute intra-abdominal or pelvic abnormality. Large volume of stool throughout the colon. 2. Enlarged fatty liver with fat sparing near the gallbladder fossa 3. Cortical scarring right kidney. Bilateral intrarenal stones without hydronephrosis. Thick-walled urinary bladder decompressed by suprapubic catheter 4. Deep left decubitus ulcer with underlying left ischial changes suggesting osteomyelitis. Her husband has been using silver collagen in the wound. She has not been systemically unwell no fever chills eating and drinking well. They rigorously offload this wound only getting up in the wheelchair when she is going to appointments the rest of the  time she is in bed. 10/8; wound measures larger and she now has exposed bone. We have been using silver alginate 11/12 still using silver alginate. The patient saw Dr. Megan Salon of infectious disease. She was started on Augmentin 500 mg twice daily. She is due to follow-up with Dr. Megan Salon I believe next week. Lab work Dr. Megan Salon requested showed a sedimentation rate of 28 and CRP of 20 although her CRP 1 year ago  was 18.8. Sedimentation rate 1 year ago was 11 basic metabolic panel showed a creatinine of 1.12 12/3; the patient followed up with Dr. Megan Salon yesterday. She is still on Augmentin twice daily. This was directed by Dr. Megan Salon. The patient's inflammatory markers have improved which is gratifying. Her C-reactive protein was repeated yesterday and follow-up booked with infectious disease in January. In addition I have been getting secure text messages I think from palliative care through [facility] [employer]. I think they were hoping to provide services to the patient in her home. They could not get a hold of the primary physician and so they reached out to me on 2 separate occasions. 12/17; patient last saw Dr. Megan Salon on 12/2. She is finishing up with Augmentin. Her C-reactive protein was 20 on 10/21, 10.1 on 11/19 and 17 on 12/2. The wound itself still has depth and undermining. We are using Santyl with the backing wet-to-dry 04/27/2019. The wound is gradually clearing up in terms of the surface although it is not filled in that much. Still abuts right against bone 2/4; patient with a deep pressure ulcer over the left ischial tuberosity. I thought she was going to follow-up with infectious disease to follow her inflammatory markers although the patient states that they stated that they did not need to see her unless we felt it was necessary. I will need to check their notes. In any case we ordered moistened silver collagen back with wet-to-dry to fill in the depth of  the wound although apparently [facility] sent silver alginate which they have been using since they were here the last time. Is obviously not what we ordered. 2/25. Not much change in this wound it is over the left ischial tuberosity recurrent wound. We have been using silver collagen with backing wet-to-dry. I think the wound is about the same. There is still some tunneling from about 10-12 o'clock over the ischial tuberosity itself 3/11; pressure ulcer over the left ischial tuberosity. Since she was last here the wound VAC was started and apparently going quite well. We are able to get the home health company that accepts [place] healthcare which is in itself sometimes problematic. There is been improvements in the wound the tunneling seems to be better and is contracted nicely 4/8; 1 month follow-up. Since she was last here we have been using silver collagen under a wound VAC. Some minor contraction I think in wound volume. She is cared for diligently by her husband including pressure relief, incontinence management, nutritional support etc. 6/1; this is almost a 26-monthfollow-up. She is been using silver collagen under wound VAC. Circular area over the left ischial tuberosity. She has been using silver collagen under wound VAC 7/8; 1 month follow-up. Silver collagen under the VAC not really a lot of progress. Tissue at the base of the wound which is right against bone and the tissue next that this does not look completely viable. She is not currently on any antibiotics, she had underlying osteomyelitis I need to look this over 8/16; we are using silver collagen under wound VAC to the left ischial tuberosity wound. Comes in today with absolutely no change in surface area or depth. There is no exposed bone. I did look over her infectious disease notes as I said I would do last time. She last saw Dr. CMegan Salonin December 2020. She completed 6 weeks of Augmentin. This was in response to a bone culture I  did showing methicillin susceptible staph aureus  and Enterococcus. She was supposed to come back to see Dr. Megan Salon at some point although they say that that appointment was canceled unless I chose to recommend return. I think there was supposed to be follow-up with inflammatory markers but I cannot see that that was ever done. She has not been on antibiotics since 9/21; monthly follow-up. We received a call from home health nurse last evening to report green drainage coming out of the wound. Lab work I ordered last time showed a white count of 5.2 a sedimentation rate of 45 and a C-reactive protein of 25 however neither one of the 2 values are substantially different from her previous values in October 2020 or December 2020. Both are slightly higher but only marginally. Otherwise no new complaints from the patient or her husband 10/19; 1 month follow-up. PCR culture I did last time showed medium quantities of Pseudomonas lower quantities Klebsiella and Enterococcus faecalis group B strep and Peptostreptococcus. I gave her Augmentin for 2 weeks. I am not really sure of my choice of this I would not cover Pseudomonas. She is still having green drainage. Wound itself looks satisfactory there is not a lot of depth wound bed looks healthy 11/16; patient has completed the antibiotics still using gentamicin and silver alginate on the wound. There is improvement in the surface area 12/21; in general the area on the buttock looks somewhat better. Surface looks healthy although I do not know that there is been much improvement in the wound volume. We have been using silver alginate and Hydrofera Blue. Less drainage. In passing the husband showed me an abrasion injury on the left anterior tibia. Covered in necrotic surface. He has noticed this for about a week and has been putting silver collagen on it. He is completely uncertain about how this happened 1/25; monthly follow-up. The area on the left buttock is  about the same. This does not go to bone but a fairly deep wound surface of the wound is of questionable viability. The abrasion injury that they showed me last time apparently was closed out by home health because they thought it was healed but certainly is not although it is just about healed. As a result they haven't been applying anything to this area Finally I did discuss with the patient and her husband the idea of an advanced treatment product to try and get a proper base to this wound I was thinking of Puraply however actually the patient points out that her co-pay for coming to visit [place] i.e. the facility, charge would be unaffordable if they have to, on a weekly basis 2/22; pressure ulcer on the left buttock appears deeper to me and abuts on the ischial tuberosity. I thought initially there was exposed bone but there is a rim of tissue over this area. She also has a superficial over the right anterior mid tibia. Been using silver collagen to these areas without much success. I have looked over the patient's past history with regards to the area on the left ischium. She did have underlying osteomyelitis here dating back I think to late 2020. She saw Dr. Megan Salon she received a 6-week course of oral antibiotics in response to a bone culture that I did. This does not appear to be infected but it certainly has not been improving in terms of granulation. I do not believe she has had any recent imaging studies 3/29; 1 month follow-up. Pressure ulcer on the left buttock which she has been dealing with with  for a number of years. She was treated for underlying osteomyelitis at 1.2 or 3 years ago I think with infectious disease help. She also had I think a flap closure by Dr. Leland Johns and that lasted for about a year and then reopened. I have not been able to get this patient to progress towards healing although truthfully the wound is absolutely no worse. We have been using Hydrofera Blue Electronic  Signature(s) Signed: 07/05/2020 7:47:56 AM By: Linton Ham MD Entered By: Linton Ham on 07/03/2020 12:37:07 -------------------------------------------------------------------------------- Physical Exam Details Patient Name: Date of Service: [name]. 07/03/2020 11:00 A M Medical Record Number: [id number] Patient Account Number: [id number] Date of Birth/Sex: Treating RN: [birth year] (70 y.o. Benjaman Lobe Primary Care Provider: [name] Other Clinician: Referring Provider: Treating Provider/Extender: Tommy Rainwater in Treatment: 64 Constitutional Patient is hypertensive.. Pulse regular and within target range for patient.Marland Kitchen Respirations regular, non-labored and within target range.. Temperature is normal and within the target range for the patient.Marland Kitchen Appears in no distress. Notes Left ischial tuberosity. This appears about the same as last time. Still with depth and reasonably healthy looking tissue at the base however this is right against bone. There is no exposed bone no evidence of soft tissue infection. Wound looks clean Electronic Signature(s) Signed: 07/05/2020 7:47:56 AM By: Linton Ham MD Entered By: Linton Ham on 07/03/2020 12:41:19 -------------------------------------------------------------------------------- Physician Orders Details Patient Name: Date of Service: [name]. 07/03/2020 11:00 A M Medical Record Number: [id number] Patient Account Number: [id number] Date of Birth/Sex: Treating RN: [birth year] (70 y.o. [name], [name] Primary Care Provider: [name] Other Clinician: Referring Provider: Treating Provider/Extender: Tommy Rainwater in Treatment: 79 Verbal / Phone Orders: No Diagnosis Coding Follow-up Appointments Return appointment in 1 month. Bathing/ Shower/ Hygiene May shower and wash wound with soap and water. - with dressingchanges Edema Control - Lymphedema / SCD /  Other Elevate legs to the level of the heart or above for 30 minutes daily and/or when sitting, a frequency of: Avoid standing for long periods of time. Off-Loading Turn and reposition every 2 hours [facility] wound care orders this week; continue Home Health for wound care. May utilize formulary equivalent dressing for wound treatment orders unless otherwise specified. Wound Treatment Wound #12 - Ischial Tuberosity Wound Laterality: Left Cleanser: Soap and Water [facility]) Every Other Day/30 Days Discharge Instructions: May shower and wash wound with dial antibacterial soap and water prior to dressing change. Cleanser: Wound Cleanser [facility]) Every Other Day/30 Days Discharge Instructions: Cleanse the wound with wound cleanser prior to applying a clean dressing using gauze sponges, not tissue or cotton balls. Peri-Wound Care: Skin Prep [facility]) Every Other Day/30 Days Discharge Instructions: Use skin prep as directed Prim Dressing: Promogran Prisma Matrix, 4.34 (sq in) (silver collagen) (Home Health) Every Other Day/30 Days ary Discharge Instructions: Moisten collagen with saline or hydrogel. Apply moistened gauze on top of prisma. Secondary Dressing: ComfortFoam Border, 4x4 in (silicone border) [facility]) Every Other Day/30 Days Discharge Instructions: Apply over primary dressing as directed. Electronic Signature(s) Signed: 07/03/2020 5:10:40 PM By: Rhae Hammock RN Signed: 07/05/2020 7:47:56 AM By: Linton Ham MD Entered By: Rhae Hammock on 07/03/2020 12:13:39 -------------------------------------------------------------------------------- Problem List Details Patient Name: Date of Service: [name], [name]. 07/03/2020 11:00 A M Medical Record Number: [id number] Patient Account Number: [id number] Date of Birth/Sex: Treating RN: October 28, 1950 (70 y.o. [name], [name] Primary Care Provider: [name] Other  Clinician: Referring Provider: Treating  Provider/Extender: Tommy Rainwater in Treatment: 78 Active Problems ICD-10 Encounter Code Description Active Date MDM Diagnosis 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, stage 4 12/30/2018 No Yes E11.622 Type 2 diabetes mellitus with other skin ulcer 12/30/2018 No Yes S80.811D Abrasion, right lower leg, subsequent encounter 03/27/2020 No Yes L97.811 Non-pressure chronic ulcer of other part of right lower leg limited to breakdown 03/27/2020 No Yes of skin Inactive Problems ICD-10 Code Description Active Date Inactive Date G82.20 Paraplegia, unspecified 12/30/2018 12/30/2018 M86.68 Other chronic osteomyelitis, other site 02/17/2019 02/17/2019 Resolved Problems Electronic Signature(s) Signed: 07/05/2020 7:47:56 AM By: Linton Ham MD Entered By: Linton Ham on 07/03/2020 12:32:45 -------------------------------------------------------------------------------- Progress Note Details Patient Name: Date of Service: [name]. 07/03/2020 11:00 A M Medical Record Number: [id number] Patient Account Number: [id number] Date of Birth/Sex: Treating RN: [birth year] (70 y.o. [name], [name] Primary Care Provider: [name] Other Clinician: Referring Provider: Treating Provider/Extender: Tommy Rainwater in Treatment: 78 Subjective History of Present Illness (HPI) The following HPI elements were documented for the patient's wound: Location: open ulceration of the left gluteal area, left heel and right ankle for about 5 months. Quality: Patient reports No Pain. Severity: Patient states wound(s) are getting worse. Duration: Patient has had the wound for > 5 months prior to seeking treatment at the wound center Context: The wound occurred when the patient has been paraplegic for about 3 years. Modifying Factors: Wound improving due to current treatment. Associated Signs and Symptoms: Patient reports having foul odor. this 70 year old patient who is  known to have hypertension, hypothyroidism, breast cancer, chronic pain syndrome, paraplegia was noted to have a left gluteal decubitus ulcer and was brought into the hospital. During the course of her hospitalization she was debrided in the operating room by ankle wound. Bone cultures were taken at that time but were negative but clinically she was treated for osteomyelitis because of the probing down to bone and open exposed bone. Home health has been giving her antibioticss which include vancomycin and Zosyn. The patient was a smoker until about 3 weeks ago and used to smoke about 10 cigarettes a day for a long while. 12/13/2014 - details of her operative note from 11/03/2014 were reviewed -- PROCEDURE: 1. Excisional debridement skin, subcutaneous, muscle left ischium 35 cm2 2. Excisional debridement skin, subcutaneous tissue left heel 27 cm2 3. Excisional debridement right ankle skin, subcutaneous, bone 30 cm2 01/24/2015 -- she has some issues with her wheelchair cushion but other than that is doing very well and has received Podus boots for her feet. 02/14/2015 -- she was using her old offloading boots and this seemed to have caused her a new pressure ulcer on the left posterior heel near the superior part just below the Achilles tendon. 03/07/2015 -- she has a new ulceration just to the left of the midline on her sacral region more on the left buttock and this has been there for Dr. Leland Johns and had all the wounds sharply debrided. The debridement was done for the left ischial wound, the left heel wound and the right about a week. 08/22/2015 -- was recently admitted to hospital between May 5 and 08/13/2015, with sepsis and leukocytosis due to a UTI. she was treated for a sepsis complicating Escherichia coli UTI and kidney stones. She also had metabolic and careful up at the secondary to pyelonephritis. He received broad-spectrum antibiotics initially and then received Macrobid as per urology. She  was sent home  on nitrofurantoin. during her admission she had a CT scan which showed exposed left ischial tuberosity without evidence of osteolysis. 09/12/2015-- the patient is having some issues with her air mattress and would like to get a opinion from medical modalities. 10/10/2015 -- the issue with her air mattress has not yet been sorted out and the new problem seems to be a lot of odor from the wound VAC. 11/27/2015 -- the patient was admitted to the hospital between July 23 and 10/31/2015. Her problems were sepsis, osteomyelitis of the pelvic bone and acute pyelonephritis. CT of the abdomen and pelvis was consistent with a left-sided pyelonephritis with hydronephrosis and also just showed new sclerosis of the posterior portion of the left anterior pubic ramus suggestive of periosteal reaction consistent with osteomyelitis. She was treated for the osteomyelitis with infectious disease consult recommending 6 weeks of IV antibiotics including vancomycin and Rocephin and the antibiotics were to go on until 12/10/2015. He was seen by Dr. Leta Baptist plastic surgery and Dr. Luciana Axe of infectious disease. She had a suprapubic catheter placed during the admission. CT scan done on 10/28/2015 showed specifically -- New sclerosis of the posterior portion of the left inferior pubic ramus with aggressive periosteal reaction, consistent with osteomyelitis, with adjacent soft tissue gas compatible with previously described decubitus ulcer. 12/12/2015 -- she was recently seen by Dr. Luciana Axe, who noted good improvement and CRP and ESR compared to before and he has stopped her antibiotic as per plans to finish on September 4. The patient was encouraged to continue with wound care and consider hyperbaric oxygen therapy. Today she tells me that she has consented to undergo hyperbaric oxygen therapy and we can start the paperwork. 01/02/2016 -- her PCP had gained about 3 years but she still persists in having problems  during hyperbaric oxygen therapy with some discomfort in the ears. 01/09/16; pressure area with underlying osteomyelitis in the left buttock. Wound bed itself has some slight amount of grayish surface slough however I do not think any debridement was necessary. There is no exposed bone soft tissue appears stable. She is using a wound VAC 01/16/16; back for weekly wound review in conjunction with HBO. She has a deep wound over the left initial tuberosity previously treated with 6 weeks of IV antibiotics for osteomyelitis. Wound bed looks reasonably healthy although the base of this is still precariously close to bone. She has been using a wound VAC. 01/23/2016 -- she has completed her course of antibiotics and this week the only new thing is her right great toe nail was avulsed and she has got an open wound over the nailbed. 01/31/16 she has completed her course of antibiotics. Her right great toenail avulsed last week and she's been using silver alginate for this as well. Still using a wound VAC to the substantial stage IV wound over the left ischial tuberosity 03/05/2016 -- the patient has had a opinion from the plastic surgery group at [facility] and details of this are not available yet but the patient's verbal report has been heard by me. Did not sound like there was any optimistic discussion regarding reconstruction and the net result would be to continue with the wound VAC application. I will await the official reports. Addendum: -- she was seen at [facility] health Plastic surgery service by Dr. Tamela Oddi. After a thorough review and from what I understand spending 45 minutes with the patient his assessment has been noted by me in detail and the management options were:  1. Continued pressure offloading and wound care versus operative procedures including wound excision 2. Soft tissue and bone sampling 3. If the wound gets larger wound closure would be done using a variety of  plastic surgical techniques including but not limited to skin substitute, possible skin graft, local versus regional flaps, negative pressure dressing application. 4. He discussed with her details of flap surgery and the risks associated 5. He made a comment that since the patient was operated on by Dr. Leland Johns of [facility] plastic surgery unit in [place] the patient may continue to follow-up there for further evaluation for surgical flap closure in the future. 03/19/2016 -- the patient continues to be rather depressed and frustrated with her lack of rapid progress in healing this wound especially because she thought after hyperbaric oxygen therapy the wound would heal extremely fast. She now understands that was not the implied benefit on wound care which was the recommendation for hyperbaric oxygen therapy. I have had a lengthy discussion with the patient and her husband regarding her options: 1. Continue with collagen and wound VAC for the primary dressing and offloading and all supportive care. 2. See Dr. Iran Planas for possible placement of Acell or Integra in the OR. 3. get a second opinion from a wound care center and surrounding regions/counties 05/07/2016 -- Note from Dr. Celedonio Miyamoto, who noted that the patient has declined flap surgery. She has discussed application of A cell, and try a few applications to see how the wound progresses. She is also recommended that we could apply products here in the wound center, like Oasis. during her preop workup it was found that her hemoglobin A1c was 11% and she has now been diagnosed as having diabetes mellitus and has been put on appropriate treatment by her PCP 05/28/2016 -- tells me her blood sugars have been doing well and she has an appointment to see her PCP in the next couple of weeks to check her hemoglobin A1c. Other than that she continues to do well. 06/25/2016 -- have not seen her back for the last month but she says  her health has been about the same and she has an appointment to check the A1c next week 09/10/16 ---- was seen by Dr. Celedonio Miyamoto -- who applied Acell and saw her back in follow-up. She has recommended silver alginate to the wound every other day and cover with foam. If no significant drainage could transition to collagen every other day. She recommended discontinuing wound VAC. There were no plans to repeat application of Acell. The patient expressed that her husband could do the wound care as going to the Wound Ctr., would cost several $100 for each visit. 10/21/2016 -- her insurance company is getting her new mattress and she is pleased about that. Other than that she has been doing dressings with PolyMem Silver and has been doing very well 02/18/2017 -- she has gone through several changes of her mattress and has not been pleased with any of them. The ventricles are still working on trying to fit her with the appropriate low air-loss mattress. She has a new wound on the gluteal area which is clearly separated from the original wound. 03/25/17-she is here in follow-up evaluation for her left ischialpressure ulcer. She remains unsatisfied with her pressure mattress. She admits to sitting multiple hours a day, in the bed. We have discussed offloading options. The wound does not appear infected. Nutrition does not appear to be a concern. Will follow-up in 4 weeks,  if wound continues to be stalled may consider x-ray to evaluate for refractory osteomyelitis. 04/21/17; this is a patient that I don't know all that well. She has a chronic wound which at one point had underlying osteomyelitis in the left ischial tuberosity. This is a stage IV pressure ulcer. Over the last 3 months she has a stage II wound inferiorly to the original wound. The last time she was here her dressing was changed to silver collagen although the patient's husband who changes the dressing said that the collagen stuck to the wound  and remove skin from the superficial area therefore he switched back to PolyMem AG 05/13/17; this is a patient we've been following for a left ischial tuberosity wound which was stage IV at one point had underlying osteomyelitis. Over the last several months she's had a stage II wound just inferior and medial to the related to the wound. According to her husband he is using Endoform layer with collagen although this is not what I had last time. According to her husband they are using Ronnald Nian with collagen although I don't quite know how that started. She was hospitalized from 1/20 through 04/30/16. This was related to a UTI. Her blood cultures were negative, urine culture showed multiple species. She did have a CT scan of the abdomen and pelvis which documented chronic osteomyelitis in the area of the wound inflammatory markers were unremarkable. She has had prior knowledge of osteomyelitis. It looks as though she received IV antibiotics in 2017 and was treated with a course of hyperbaric oxygen. 05/28/17; the wound over the left ischial tuberosity is deeper today and abuts clearly on bone. Nursing intake reported drainage. I therefore culture of the wound. The more superficial area just below this looks about the same. They once again complained that there are mattress cover is not working although apparently [facility] is been noted to see this many times in the report is that the device is functional 06/18/17; the patient had a probing area on the left ischial tuberosity that was draining purulent fluid last time. This also clearly seemed to have open bone. Culture I did showed pansensitive pseudomonas including third generation cephalosporins. I treated this with cefdinir 300 twice a day for 10 days and things seem to have improved. She has a more superficial wound just underneath this area. Amazingly she has a new air mattress through [facility]. I think they gave this to her as  a parking give. In any case this now works according to the patient may have something to do with why the areas are looking better. 07/09/17; the patient has a probing area in the left ischial tuberosity that still has some depth. However this is contracted in terms of the wound orifice although the depth is still roughly the same. There is no undermining. ooShe also has the satellite wound which is more superficial. This appears to have a healthy surface we've been using silver collagen 08/06/17; the patient's wound is over the left ischial tuberosity and a satellite lesion just underneath this. The original wound was actually a deep stage 4 wound. We have made good progress in 2 months and there is no longer exposed bone here. 09/03/17; left ischial tuberosity actually appears to be quite healthy. I think we are making progress. No debridement is required. There is no surrounding erythema 10/01/17 I follow this patient monthly for her left ischial tuberosity wound. There is 2 areas the original area and a satellite area.  The satellite area looks a lot better there is no surrounding erythema. Her husband relates that he is having trouble maintaining the dressing. This has to do with the soft tissue around it. He states he puts the collagen in but he cannot make sure that it stays in even with the ABD pads and tape that he is been using 10/29/17; patient arrives with a better looking noon today. Some of the satellite lesions have closed. using Prisma 11/26/17; the patient has a large cone-shaped area with the tip of the Cone deep within her buttock soft tissue. The walls of the Cone are epithelialized however the base is still open. The area at the base of this looks moist we've been using silver collagen. Will change to silver alginate 12/31/2017; the wound appears to have come in fairly nicely. Using silver alginate. There is no surrounding maceration or infection 01/28/18; there is still an open area  here over the left initial tuberosity. Base of this however looks healthy. There is no surrounding infection 02/25/18; the area of its open is over the left ischial tuberosity. The base of this is where the wound is. This is a large inverted cone-shaped area with the wound at the tip. Dimensions of the wound at the tip are improved. There is a area of denuded skin about halfway towards the tip which her husband thinks may have happened today when he was bathing her. 04/20/17; the area is still open over the left initial tuberosity. 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 This is now completely closed over although it is closed over with a divot and skin over bone at the base of this. Continued aggressive offloading will be necessary. 12/30/2018 READMISSION This is a 70 year old woman with chronic paraplegia. I picked her up for her care from Dr. Con Memos in this clinic after he departed. She had a stage IV pressure wound over the left ischial tuberosity. She was treated twice for her underlying osteomyelitis and this I believe firstly in 2016 and again in 2017. There were some plans at some point for flap closure of this however she was discovered to have uncontrolled diabetes and I do not think this was ever accomplished. She ultimately healed over in this clinic and was discharged in May. She has a large cone-shaped indentation with the tip of this going towards the left ischial tuberosity. It is not an easy area to examine but at that time I thought all of this was epithelialized. Apparently there was a reopening here shortly after she left the clinic last time. She was admitted to hospital at the end of June for Klebsiella bacteremia felt to be secondary to UTI. A CT scan of the pelvis is listed below and  there was initially some concern that she had underlying osteomyelitis although I believe she was seen by infectious disease and that was felt to be not the case: I do not see any new cultures or inflammatory markers IMPRESSION: 1. No CT evidence for acute intra-abdominal or pelvic abnormality. Large volume of stool throughout the colon. 2. Enlarged fatty liver with fat sparing near the gallbladder fossa 3. Cortical scarring right kidney. Bilateral intrarenal stones without hydronephrosis. Thick-walled urinary bladder decompressed by suprapubic catheter 4. Deep left decubitus ulcer with underlying left ischial changes suggesting osteomyelitis.  Her husband has been using silver collagen in the wound. She has not been systemically unwell no fever chills eating and drinking well. They rigorously offload this wound only getting up in the wheelchair when she is going to appointments the rest of the time she is in bed. 10/8; wound measures larger and she now has exposed bone. We have been using silver alginate 11/12 still using silver alginate. The patient saw Dr. Megan Salon of infectious disease. She was started on Augmentin 500 mg twice daily. She is due to follow-up with Dr. Megan Salon I believe next week. Lab work Dr. Megan Salon requested showed a sedimentation rate of 28 and CRP of 20 although her CRP 1 year ago was 18.8. Sedimentation rate 1 year ago was 11 basic metabolic panel showed a creatinine of 1.12 12/3; the patient followed up with Dr. Megan Salon yesterday. She is still on Augmentin twice daily. This was directed by Dr. Megan Salon. The patient's inflammatory markers have improved which is gratifying. Her C-reactive protein was repeated yesterday and follow-up booked with infectious disease in January. In addition I have been getting secure text messages I think from palliative care [facility] [employer]. I think they were hoping to provide services to the patient in her  home. They could not get a hold of the primary physician and so they reached out to me on 2 separate occasions. 12/17; patient last saw Dr. Megan Salon on 12/2. She is finishing up with Augmentin. Her C-reactive protein was 20 on 10/21, 10.1 on 11/19 and 17 on 12/2. The wound itself still has depth and undermining. We are using Santyl with the backing wet-to-dry 04/27/2019. The wound is gradually clearing up in terms of the surface although it is not filled in that much. Still abuts right against bone 2/4; patient with a deep pressure ulcer over the left ischial tuberosity. I thought she was going to follow-up with infectious disease to follow her inflammatory markers although the patient states that they stated that they did not need to see her unless we felt it was necessary. I will need to check their notes. In any case we ordered moistened silver collagen back with wet-to-dry to fill in the depth of the wound although apparently [facility] sent silver alginate which they have been using since they were here the last time. Is obviously not what we ordered. 2/25. Not much change in this wound it is over the left ischial tuberosity recurrent wound. We have been using silver collagen with backing wet-to-dry. I think the wound is about the same. There is still some tunneling from about 10-12 o'clock over the ischial tuberosity itself 3/11; pressure ulcer over the left ischial tuberosity. Since she was last here the wound VAC was started and apparently going quite well. We are able to get the home health company that accepts [place] healthcare which is in itself sometimes problematic. There is been improvements in the wound the tunneling seems to be better and is contracted nicely 4/8; 1 month follow-up. Since she was last here we have been using silver collagen under a wound VAC. Some minor contraction I think in wound volume. She is cared for diligently by her husband including pressure relief, incontinence  management, nutritional support etc. 6/1; this is almost a 23-month follow-up. She is been using silver collagen under wound VAC. Circular area over the left ischial tuberosity. She has been using silver collagen under wound VAC 7/8; 1 month follow-up. Silver collagen under the VAC not really a  lot of progress. Tissue at the base of the wound which is right against bone and the tissue next that this does not look completely viable. She is not currently on any antibiotics, she had underlying osteomyelitis I need to look this over 8/16; we are using silver collagen under wound VAC to the left ischial tuberosity wound. Comes in today with absolutely no change in surface area or depth. There is no exposed bone. I did look over her infectious disease notes as I said I would do last time. She last saw Dr. Megan Salon in December 2020. She completed 6 weeks of Augmentin. This was in response to a bone culture I did showing methicillin susceptible staph aureus and Enterococcus. She was supposed to come back to see Dr. Megan Salon at some point although they say that that appointment was canceled unless I chose to recommend return. I think there was supposed to be follow-up with inflammatory markers but I cannot see that that was ever done. She has not been on antibiotics since 9/21; monthly follow-up. We received a call from home health nurse last evening to report green drainage coming out of the wound. Lab work I ordered last time showed a white count of 5.2 a sedimentation rate of 45 and a C-reactive protein of 25 however neither one of the 2 values are substantially different from her previous values in October 2020 or December 2020. Both are slightly higher but only marginally. Otherwise no new complaints from the patient or her husband 10/19; 1 month follow-up. PCR culture I did last time showed medium quantities of Pseudomonas lower quantities Klebsiella and Enterococcus faecalis group B strep and  Peptostreptococcus. I gave her Augmentin for 2 weeks. I am not really sure of my choice of this I would not cover Pseudomonas. She is still having green drainage. Wound itself looks satisfactory there is not a lot of depth wound bed looks healthy 11/16; patient has completed the antibiotics still using gentamicin and silver alginate on the wound. There is improvement in the surface area 12/21; in general the area on the buttock looks somewhat better. Surface looks healthy although I do not know that there is been much improvement in the wound volume. We have been using silver alginate and Hydrofera Blue. Less drainage. In passing the husband showed me an abrasion injury on the left anterior tibia. Covered in necrotic surface. He has noticed this for about a week and has been putting silver collagen on it. He is completely uncertain about how this happened 1/25; monthly follow-up. The area on the left buttock is about the same. This does not go to bone but a fairly deep wound surface of the wound is of questionable viability. The abrasion injury that they showed me last time apparently was closed out by home health because they thought it was healed but certainly is not although it is just about healed. As a result they haven't been applying anything to this area Finally I did discuss with the patient and her husband the idea of an advanced treatment product to try and get a proper base to this wound I was thinking of Puraply however actually the patient points out that her co-pay for coming to visit [place] the facility, charge would be unaffordable if they have to, on a weekly basis 2/22; pressure ulcer on the left buttock appears deeper to me and abuts on the ischial tuberosity. I thought initially there was exposed bone but there is a rim of tissue over  this area. She also has a superficial over the right anterior mid tibia. Been using silver collagen to these areas without much success. I have  looked over the patient's past history with regards to the area on the left ischium. She did have underlying osteomyelitis here dating back I think to late 2020. She saw Dr. Megan Salon she received a 6-week course of oral antibiotics in response to a bone culture that I did. This does not appear to be infected but it certainly has not been improving in terms of granulation. I do not believe she has had any recent imaging studies 3/29; 1 month follow-up. Pressure ulcer on the left buttock which she has been dealing with with for a number of years. She was treated for underlying osteomyelitis at 1.2 or 3 years ago I think with infectious disease help. She also had I think a flap closure by Dr. Leland Johns and that lasted for about a year and then reopened. I have not been able to get this patient to progress towards healing although truthfully the wound is absolutely no worse. We have been using Hydrofera Blue Objective Constitutional Patient is hypertensive.. Pulse regular and within target range for patient.Marland Kitchen Respirations regular, non-labored and within target range.. Temperature is normal and within the target range for the patient.Marland Kitchen Appears in no distress. Vitals Time Taken: 11:46 AM, Height: 63 in, Weight: 185 lbs, BMI: 32.8, Temperature: 97.6 F, Pulse: 97 bpm, Respiratory Rate: 17 breaths/min, Blood Pressure: 176/85 mmHg. General Notes: Left ischial tuberosity. This appears about the same as last time. Still with depth and reasonably healthy looking tissue at the base however this is right against bone. There is no exposed bone no evidence of soft tissue infection. Wound looks clean Integumentary (Hair, Skin) Wound #12 status is Open. Original cause of wound was Pressure Injury. The date acquired was: 09/06/2018. The wound has been in treatment 78 weeks. The wound is located on the Left Ischial Tuberosity. The wound measures 3cm length x 1.2cm width x 1.3cm depth; 2.827cm^2 area and 3.676cm^3 volume.  There is Fat Layer (Subcutaneous Tissue) exposed. There is no tunneling noted, however, there is undermining starting at 6:00 and ending at 12:00 with a maximum distance of 1cm. There is a medium amount of serosanguineous drainage noted. The wound margin is well defined and not attached to the wound base. There is large (67-100%) red, pink granulation within the wound bed. There is no necrotic tissue within the wound bed. Wound #13 status is Healed - Epithelialized. Original cause of wound was Trauma. The date acquired was: 03/23/2020. The wound has been in treatment 14 weeks. The wound is located on the Right,Distal,Anterior Lower Leg. The wound measures 0cm length x 0cm width x 0cm depth; 0cm^2 area and 0cm^3 volume. 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 Bathing/ Shower/ Hygiene: May shower and wash wound with soap and water. - with dressingchanges Edema Control - Lymphedema / SCD / Other: Elevate legs to the level of the heart or above for 30 minutes daily and/or when sitting, a frequency of: Avoid standing for long periods of time. Off-Loading: Turn and reposition every 2 hours Home Health: New wound care orders this week; continue Home Health for wound care. May utilize formulary equivalent dressing for wound  treatment orders unless otherwise specified. WOUND #12: - Ischial Tuberosity Wound Laterality: Left Cleanser: Soap and Water [facility]) Every Other Day/30 Days Discharge Instructions: May shower and wash wound with dial antibacterial soap and water prior to dressing change. Cleanser: Wound Cleanser [facility]) Every Other Day/30 Days Discharge Instructions: Cleanse the wound with wound cleanser  prior to applying a clean dressing using gauze sponges, not tissue or cotton balls. Peri-Wound Care: Skin Prep [facility]) Every Other Day/30 Days Discharge Instructions: Use skin prep as directed Prim Dressing: Promogran Prisma Matrix, 4.34 (sq in) (silver collagen) (Home Health) Every Other Day/30 Days ary Discharge Instructions: Moisten collagen with saline or hydrogel. Apply moistened gauze on top of prisma. Secondary Dressing: ComfortFoam Border, 4x4 in (silicone border) [facility]) Every Other Day/30 Days Discharge Instructions: Apply over primary dressing as directed. #1 I changed back to moistened silver collagen to packing the wounds. It does not appear that he was able to get the [facility] incorrectly 2. I outlined the options to continue to simply dress this wound and keep hoping that something will result in better granulation. The other options would be an advanced treatment product and/or reconsideration of plastic surgery 3. She had osteomyelitis in the left ischium at one point although I see very little evidence of infection at this point in time 4. The abrasion injury on her right lower leg from last time is closed Electronic Signature(s) Signed: 07/05/2020 7:47:56 AM By: Linton Ham MD Entered By: Linton Ham on 07/03/2020 12:43:13 -------------------------------------------------------------------------------- SuperBill Details Patient Name: Date of Service: [name] 07/03/2020 Medical Record Number: [id number] Patient Account Number: [id number] Date of Birth/Sex: Treating RN: 1950-08-27 (70 y.o. [name], [name] Primary Care Provider: [name] Other Clinician: Referring Provider: Treating Provider/Extender: Tommy Rainwater in Treatment: 78 Diagnosis Coding ICD-10 Codes Code Description [id number] Pressure ulcer of left buttock, stage 4 E11.622 Type 2 diabetes mellitus with other skin ulcer S80.811D Abrasion, right lower  leg, subsequent encounter L97.811 Non-pressure chronic ulcer of other part of right lower leg limited to breakdown of skin Physician Procedures : CPT4 Code Description Modifier [id number] - WC PHYS LEVEL 3 - EST PT ICD-10 Diagnosis Description 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, stage 4 E11.622 Type 2 diabetes mellitus with other skin ulcer Quantity: 1 Electronic Signature(s) Signed: 07/05/2020 7:47:56 AM By: Linton Ham MD Entered By: Linton Ham on 07/03/2020 12:43:22

## 2020-07-05 NOTE — Progress Notes (Signed)
SALLEY, BOXLEY ([id number]) Visit Report for 07/03/2020 Arrival Information Details Patient Name: Date of Service: [name], PETRON 07/03/2020 11:00 A M Medical Record Number: [id number] Patient Account Number: [id number] Date of Birth/Sex: Treating RN: 1951-03-20 (70 y.o. [name], [name] Primary Care Faust Thorington: [name] Other Clinician: Referring Bona Hubbard: Treating Camdon Saetern/Extender: Tommy Rainwater in Treatment: 70 Visit Information History Since Last Visit Added or deleted any medications: No Patient Arrived: Wheel Chair Any new allergies or adverse reactions: No Arrival Time: 11:47 Had a fall or experienced change in No Accompanied By: husband activities of daily living that may affect Transfer Assistance: Manual risk of falls: Patient Identification Verified: Yes Signs or symptoms of abuse/neglect since last visito No Secondary Verification Process Completed: Yes Hospitalized since last visit: No Patient Requires Transmission-Based Precautions: No Implantable device outside of the clinic excluding No Patient Has Alerts: No cellular tissue based products placed in the center since last visit: Has Dressing in Place as Prescribed: Yes Pain Present Now: Yes Electronic Signature(s) Signed: 07/03/2020 5:10:40 PM By: Rhae Hammock RN Entered By: Rhae Hammock on 07/03/2020 11:47:29 -------------------------------------------------------------------------------- Encounter Discharge Information Details Patient Name: Date of Service: [name] Pulse M. 07/03/2020 11:00 A M Medical Record Number: [id number] Patient Account Number: [id number] Date of Birth/Sex: Treating RN: 04-02-1951 (70 y.o. [name] Primary Care Ruchy Wildrick: [name] Other Clinician: Referring Verlon Carcione: Treating Cylis Ayars/Extender: Tommy Rainwater in Treatment: 74 Encounter Discharge Information Items Discharge Condition: Stable Ambulatory Status:  Wheelchair Discharge Destination: Home Transportation: Private Auto Accompanied By: spouse Schedule Follow-up Appointment: Yes Clinical Summary of Care: Patient Declined Electronic Signature(s) Signed: 07/03/2020 5:09:29 PM By: Baruch Gouty RN, BSN Entered By: Baruch Gouty on 07/03/2020 12:59:46 -------------------------------------------------------------------------------- Lower Extremity Assessment Details Patient Name: Date of Service: [name]. 07/03/2020 11:00 A M Medical Record Number: [id number] Patient Account Number: [id number] Date of Birth/Sex: Treating RN: May 26, 1950 (70 y.o. [name], [name] Primary Care Thorvald Orsino: [name] Other Clinician: Referring Rashaunda Rahl: Treating Talea Manges/Extender: Tommy Rainwater in Treatment: 59 Electronic Signature(s) Signed: 07/03/2020 5:10:40 PM By: Rhae Hammock RN Entered By: Rhae Hammock on 07/03/2020 12:01:51 -------------------------------------------------------------------------------- Multi Wound Chart Details Patient Name: Date of Service: [name], [name]. 07/03/2020 11:00 A M Medical Record Number: [id number] Patient Account Number: [id number] Date of Birth/Sex: Treating RN: [birth year] (70 y.o. [name], [name] Primary Care Greenly Rarick: [name] Other Clinician: Referring Atlas Kuc: Treating Shany Marinez/Extender: Tommy Rainwater in Treatment: 24 Vital Signs Height(in): 63 Pulse(bpm): 34 Weight(lbs): 185 Blood Pressure(mmHg): 176/85 Body Mass Index(BMI): 33 Temperature(F): 97.6 Respiratory Rate(breaths/min): 17 Photos: [12:No Photos Left Ischial Tuberosity] [13:No Photos Right, Distal, Anterior Lower Leg] [N/A:N/A N/A] Wound Location: [12:Pressure Injury] [13:Trauma] [N/A:N/A] Wounding Event: [12:Pressure Ulcer] [13:Diabetic Wound/Ulcer of the Lower] [N/A:N/A] Primary Etiology: [12:Anemia, Hypertension, Type II] [13:Extremity N/A] [N/A:N/A] Comorbid History:  [12:Diabetes, Osteoarthritis, Dementia, Paraplegia, Received Radiation 09/06/2018] [13:03/23/2020] [N/A:N/A] Date Acquired: [12:78] [13:14] [N/A:N/A] Weeks of Treatment: [12:Open] [13:Healed - Epithelialized] [N/A:N/A] Wound Status: [12:3x1.2x1.3] [13:0x0x0] [N/A:N/A] Measurements L x W x D (cm) [12:2.827] [13:0] [N/A:N/A] A (cm) : rea [12:3.676] [13:0] [N/A:N/A] Volume (cm) : [12:-1097.90%] [13:100.00%] [N/A:N/A] % Reduction in A rea: [12:-1634.00%] [13:100.00%] [N/A:N/A] % Reduction in Volume: [12:6] Starting Position 1 (o'clock): [12:12] Ending Position 1 (o'clock): [12:1] Maximum Distance 1 (cm): [12:Yes] [13:N/A] [N/A:N/A] Undermining: [12:Category/Stage IV] [13:Grade 2] [N/A:N/A] Classification: [12:Medium] [13:N/A] [N/A:N/A] Exudate A mount: [12:Serosanguineous] [13:N/A] [N/A:N/A] Exudate Type: [12:red, brown] [13:N/A] [N/A:N/A] Exudate Color: [12:Well defined, not attached] [13:N/A] [  N/A:N/A] Wound Margin: [12:Large (67-100%)] [13:N/A] [N/A:N/A] Granulation A mount: [12:Red, Pink] [13:N/A] [N/A:N/A] Granulation Quality: [12:None Present (0%)] [13:N/A] [N/A:N/A] Necrotic A mount: [12:Fat Layer (Subcutaneous Tissue): Yes N/A] [N/A:N/A] Exposed Structures: [12:Fascia: No Tendon: No Muscle: No Joint: No Bone: No Small (1-33%)] [13:N/A] [N/A:N/A] Treatment Notes Electronic Signature(s) Signed: 07/03/2020 5:10:40 PM By: Rhae Hammock RN Signed: 07/05/2020 7:47:56 AM By: Linton Ham MD Entered By: Linton Ham on 07/03/2020 12:34:19 -------------------------------------------------------------------------------- Multi-Disciplinary Care Plan Details Patient Name: Date of Service: [name], [name] Posey M. 07/03/2020 11:00 A M Medical Record Number: [id number] Patient Account Number: [id number] Date of Birth/Sex: Treating RN: 12-19-1950 (70 y.o. [name], [name] Primary Care Markiesha Delia: [name] Other Clinician: Referring Loman Logan: Treating Clell Trahan/Extender: Tommy Rainwater in Treatment: 81 Active Inactive Wound/Skin Impairment Nursing Diagnoses: Knowledge deficit related to ulceration/compromised skin integrity Goals: Patient/caregiver will verbalize understanding of skin care regimen Date Initiated: 12/30/2018 Target Resolution Date: 07/22/2020 Goal Status: Active Interventions: Assess patient/caregiver ability to obtain necessary supplies Assess patient/caregiver ability to perform ulcer/skin care regimen upon admission and as needed Assess ulceration(s) every visit Provide education on ulcer and skin care Treatment Activities: Skin care regimen initiated : 12/30/2018 Topical wound management initiated : 12/30/2018 Notes: Electronic Signature(s) Signed: 07/03/2020 5:10:40 PM By: Rhae Hammock RN Entered By: Rhae Hammock on 07/03/2020 12:02:15 -------------------------------------------------------------------------------- Pain Assessment Details Patient Name: Date of Service: [name]. 07/03/2020 11:00 A M Medical Record Number: [id number] Patient Account Number: [id number] Date of Birth/Sex: Treating RN: [birth year] (70 y.o. [name], [name] Primary Care [name]: [name] Other Clinician: Referring [name]: Treating Valerye Kobus/Extender: Tommy Rainwater in Treatment: 78 Active Problems Location of Pain Severity and Description of Pain Patient Has Paino Yes Site Locations Pain Location: Generalized Pain With Dressing Change: No Duration of the Pain. Constant / Intermittento Intermittent Rate the pain. Current Pain Level: 10 Worst Pain Level: 10 Least Pain Level: 0 Tolerable Pain Level: 10 Character of Pain Describe the Pain: Aching Pain Management and Medication Current Pain Management: Medication: Yes Cold Application: No Rest: Yes Massage: No Activity: No T.E.N.S.: No Heat Application: No Leg drop or elevation: No Is the Current Pain Management Adequate:  Adequate How does your wound impact your activities of daily livingo Sleep: No Bathing: No Appetite: No Relationship With Others: No Bladder Continence: No Emotions: No Bowel Continence: No Work: No Toileting: No Drive: No Dressing: No Hobbies: No Electronic Signature(s) Signed: 07/03/2020 5:10:40 PM By: Rhae Hammock RN Entered By: Rhae Hammock on 07/03/2020 11:46:38 -------------------------------------------------------------------------------- Patient/Caregiver Education Details Patient Name: Date of Service: [name] 3/29/2022andnbsp11:00 A M Medical Record Number: [id number] Patient Account Number: [id number] Date of Birth/Gender: Treating RN: 30-Jan-1951 (70 y.o. [name], [name] Primary Care Physician: [name] Other Clinician: Referring Physician: Treating Physician/Extender: Tommy Rainwater in Treatment: 24 Education Assessment Education Provided To: Patient Education Topics Provided Wound/Skin Impairment: Methods: Explain/Verbal Responses: State content correctly [employer]) Signed: 07/03/2020 5:10:40 PM By: Rhae Hammock RN Entered By: Rhae Hammock on 07/03/2020 12:02:29 -------------------------------------------------------------------------------- Wound Assessment Details Patient Name: Date of Service: [name]. 07/03/2020 11:00 A M Medical Record Number: [id number] Patient Account Number: [id number] Date of Birth/Sex: Treating RN: 1950/09/14 (70 y.o. [name], [name] Primary Care [name]: [name] Other Clinician: Referring Draper Gallon: Treating Sally-Anne Wamble/Extender: Tommy Rainwater in Treatment: 78 Wound Status Wound Number: 12 Primary Pressure Ulcer Etiology: Wound Location: Left Ischial Tuberosity Wound Open Wounding Event: Pressure Injury Status: Date  Acquired: 09/06/2018 Comorbid Anemia, Hypertension, Type II Diabetes, Osteoarthritis, Weeks Of Treatment:  78 History: Dementia, Paraplegia, Received Radiation Clustered Wound: No Photos Wound Measurements Length: (cm) 3 Width: (cm) 1.2 Depth: (cm) 1.3 Area: (cm) 2.827 Volume: (cm) 3.676 % Reduction in Area: -1097.9% % Reduction in Volume: -1634% Epithelialization: Small (1-33%) Tunneling: No Undermining: Yes Starting Position (o'clock): 6 Ending Position (o'clock): 12 Maximum Distance: (cm) 1 Wound Description Classification: Category/Stage IV Wound Margin: Well defined, not attached Exudate Amount: Medium Exudate Type: Serosanguineous Exudate Color: red, brown Foul Odor After Cleansing: No Slough/Fibrino No Wound Bed Granulation Amount: Large (67-100%) Exposed Structure Granulation Quality: Red, Pink Fascia Exposed: No Necrotic Amount: None Present (0%) Fat Layer (Subcutaneous Tissue) Exposed: Yes Tendon Exposed: No Muscle Exposed: No Joint Exposed: No Bone Exposed: No Treatment Notes Wound #12 (Ischial Tuberosity) Wound Laterality: Left Cleanser Soap and Water Discharge Instruction: May shower and wash wound with dial antibacterial soap and water prior to dressing change. Wound Cleanser Discharge Instruction: Cleanse the wound with wound cleanser prior to applying a clean dressing using gauze sponges, not tissue or cotton balls. Peri-Wound Care Skin Prep Discharge Instruction: Use skin prep as directed Topical Primary Dressing Promogran Prisma Matrix, 4.34 (sq in) (silver collagen) Discharge Instruction: Moisten collagen with saline or hydrogel. Apply moistened gauze on top of prisma. Secondary Dressing ComfortFoam Border, 4x4 in (silicone border) Discharge Instruction: Apply over primary dressing as directed. Secured With Compression Wrap Compression Stockings Environmental education officer) Signed: 07/03/2020 5:10:40 PM By: Rhae Hammock RN Signed: 07/04/2020 9:05:34 AM By: Sandre Kitty Entered By: Sandre Kitty on 07/03/2020  17:00:18 -------------------------------------------------------------------------------- Wound Assessment Details Patient Name: Date of Service: [name]. 07/03/2020 11:00 A M Medical Record Number: [id number] Patient Account Number: [id number] Date of Birth/Sex: Treating RN: 1950-12-16 (70 y.o. [name], [name] Primary Care Liba Hulsey: [name] Other Clinician: Referring Melana Hingle: Treating Elliott Lasecki/Extender: Tommy Rainwater in Treatment: 78 Wound Status Wound Number: 13 Primary Etiology: Diabetic Wound/Ulcer of the Lower Extremity Wound Location: Right, Distal, Anterior Lower Leg Wound Status: Healed - Epithelialized Wounding Event: Trauma Date Acquired: 03/23/2020 Weeks Of Treatment: 14 Clustered Wound: No Wound Measurements Length: (cm) Width: (cm) Depth: (cm) Area: (cm) Volume: (cm) 0 % Reduction in Area: 100% 0 % Reduction in Volume: 100% 0 0 0 Wound Description Classification: Grade 2 Treatment Notes Wound #13 (Lower Leg) Wound Laterality: Right, Anterior, Distal Cleanser Peri-Wound Care Topical Primary Dressing Secondary Dressing Secured With Compression Wrap Compression Stockings Add-Ons Electronic Signature(s) Signed: 07/03/2020 5:10:40 PM By: Rhae Hammock RN Entered By: Rhae Hammock on 07/03/2020 11:56:03 -------------------------------------------------------------------------------- Vitals Details Patient Name: Date of Service: [name], [name] Posey M. 07/03/2020 11:00 A M Medical Record Number: [id number] Patient Account Number: [id number] Date of Birth/Sex: Treating RN: [birth year] (70 y.o. [name], [name] Primary Care Yvonnia Tango: [name] Other Clinician: Referring Meghanne Pletz: Treating Dannielle Baskins/Extender: Tommy Rainwater in Treatment: 78 Vital Signs Time Taken: 11:46 Temperature (F): 97.6 Height (in): 63 Pulse (bpm): 97 Weight (lbs): 185 Respiratory Rate (breaths/min): 17 Body Mass  Index (BMI): 32.8 Blood Pressure (mmHg): 176/85 Reference Range: 80 - 120 mg / dl Electronic Signature(s) Signed: 07/03/2020 5:10:40 PM By: Rhae Hammock RN Entered By: Rhae Hammock on 07/03/2020 11:47:07

## 2020-07-31 ENCOUNTER — Encounter (HOSPITAL_BASED_OUTPATIENT_CLINIC_OR_DEPARTMENT_OTHER): Payer: Medicare Other | Attending: Internal Medicine | Admitting: Internal Medicine

## 2020-07-31 ENCOUNTER — Other Ambulatory Visit: Payer: Self-pay

## 2020-07-31 DIAGNOSIS — G894 Chronic pain syndrome: Secondary | ICD-10-CM | POA: Insufficient documentation

## 2020-07-31 DIAGNOSIS — Z87891 Personal history of nicotine dependence: Secondary | ICD-10-CM | POA: Insufficient documentation

## 2020-07-31 DIAGNOSIS — L97811 Non-pressure chronic ulcer of other part of right lower leg limited to breakdown of skin: Secondary | ICD-10-CM | POA: Insufficient documentation

## 2020-07-31 DIAGNOSIS — G8220 Paraplegia, unspecified: Secondary | ICD-10-CM | POA: Insufficient documentation

## 2020-07-31 DIAGNOSIS — Z96652 Presence of left artificial knee joint: Secondary | ICD-10-CM | POA: Diagnosis not present

## 2020-07-31 DIAGNOSIS — I10 Essential (primary) hypertension: Secondary | ICD-10-CM | POA: Diagnosis not present

## 2020-07-31 DIAGNOSIS — L89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, stage 4: Secondary | ICD-10-CM | POA: Insufficient documentation

## 2020-07-31 DIAGNOSIS — S80811A Abrasion, right lower leg, initial encounter: Secondary | ICD-10-CM | POA: Diagnosis not present

## 2020-07-31 DIAGNOSIS — E11622 Type 2 diabetes mellitus with other skin ulcer: Secondary | ICD-10-CM | POA: Insufficient documentation

## 2020-07-31 DIAGNOSIS — X58XXXA Exposure to other specified factors, initial encounter: Secondary | ICD-10-CM | POA: Insufficient documentation

## 2020-07-31 NOTE — Progress Notes (Signed)
ZAKYIAH, STONEBARGER ([id number]) Visit Report for 07/31/2020 Arrival Information Details Patient Name: Date of Service: [name], [name] 07/31/2020 11:00 A M Medical Record Number: [id number] Patient Account Number: [id number] Date of Birth/Sex: Treating RN: 1950-08-27 (70 y.o. Sue Lush Primary Care Harlen Danford: [name] Other Clinician: Referring Brynlea Spindler: Treating Theus Espin/Extender: [name] in Treatment: 66 Visit Information History Since Last Visit Added or deleted any medications: No Patient Arrived: Wheel Chair Any new allergies or adverse reactions: No Arrival Time: 11:07 Had a fall or experienced change in No Accompanied By: husband activities of daily living that may affect Transfer Assistance: Manual risk of falls: Patient Identification Verified: Yes Signs or symptoms of abuse/neglect since last visito No Secondary Verification Process Completed: Yes Hospitalized since last visit: No Patient Requires Transmission-Based Precautions: No Implantable device outside of the clinic excluding No Patient Has Alerts: No cellular tissue based products placed in the center since last visit: Has Dressing in Place as Prescribed: Yes Pain Present Now: No Electronic Signature(s) Signed: 07/31/2020 5:25:58 PM By: Lorrin Jackson Entered By: Lorrin Jackson on 07/31/2020 11:12:42 -------------------------------------------------------------------------------- Clinic Level of Care Assessment Details Patient Name: Date of Service: [name] 07/31/2020 11:00 A M Medical Record Number: [id number] Patient Account Number: [id number] Date of Birth/Sex: Treating RN: Sep 04, 1950 (70 y.o. Martyn Malay, Vaughan Basta Primary Care Peg Fifer: [name] Other Clinician: Referring Atthew Coutant: Treating Clevon Khader/Extender: [name] in Treatment: 82 Clinic Level of Care Assessment Items TOOL 4 Quantity Score []  - 0 Use when only an EandM is  performed on FOLLOW-UP visit ASSESSMENTS - Nursing Assessment / Reassessment X- 1 10 Reassessment of Co-morbidities (includes updates in patient status) X- 1 5 Reassessment of Adherence to Treatment Plan ASSESSMENTS - Wound and Skin A ssessment / Reassessment X - Simple Wound Assessment / Reassessment - one wound 1 5 []  - 0 Complex Wound Assessment / Reassessment - multiple wounds []  - 0 Dermatologic / Skin Assessment (not related to wound area) ASSESSMENTS - Focused Assessment []  - 0 Circumferential Edema Measurements - multi extremities []  - 0 Nutritional Assessment / Counseling / Intervention []  - 0 Lower Extremity Assessment (monofilament, tuning fork, pulses) []  - 0 Peripheral Arterial Disease Assessment (using hand held doppler) ASSESSMENTS - Ostomy and/or Continence Assessment and Care []  - 0 Incontinence Assessment and Management []  - 0 Ostomy Care Assessment and Management (repouching, etc.) PROCESS - Coordination of Care X - Simple Patient / Family Education for ongoing care 1 15 []  - 0 Complex (extensive) Patient / Family Education for ongoing care X- 1 10 Staff obtains Programmer, systems, Records, T Results / Process Orders est []  - 0 Staff telephones HHA, Nursing Homes / Clarify orders / etc []  - 0 Routine Transfer to another Facility (non-emergent condition) []  - 0 Routine Hospital Admission (non-emergent condition) []  - 0 New Admissions / Biomedical engineer / Ordering NPWT Apligraf, etc. , []  - 0 Emergency Hospital Admission (emergent condition) X- 1 10 Simple Discharge Coordination []  - 0 Complex (extensive) Discharge Coordination PROCESS - Special Needs []  - 0 Pediatric / Minor Patient Management []  - 0 Isolation Patient Management []  - 0 Hearing / Language / Visual special needs []  - 0 Assessment of Community assistance (transportation, D/C planning, etc.) []  - 0 Additional assistance / Altered mentation []  - 0 Support Surface(s) Assessment  (bed, cushion, seat, etc.) INTERVENTIONS - Wound Cleansing / Measurement X - Simple Wound Cleansing - one wound 1 5 []  - 0 Complex Wound Cleansing -  multiple wounds X- 1 5 Wound Imaging (photographs - any number of wounds) []  - 0 Wound Tracing (instead of photographs) X- 1 5 Simple Wound Measurement - one wound []  - 0 Complex Wound Measurement - multiple wounds INTERVENTIONS - Wound Dressings X - Small Wound Dressing one or multiple wounds 1 10 []  - 0 Medium Wound Dressing one or multiple wounds []  - 0 Large Wound Dressing one or multiple wounds X- 1 5 Application of Medications - topical []  - 0 Application of Medications - injection INTERVENTIONS - Miscellaneous []  - 0 External ear exam []  - 0 Specimen Collection (cultures, biopsies, blood, body fluids, etc.) []  - 0 Specimen(s) / Culture(s) sent or taken to Lab for analysis []  - 0 Patient Transfer (multiple staff / Civil Service fast streamer / Similar devices) []  - 0 Simple Staple / Suture removal (25 or less) []  - 0 Complex Staple / Suture removal (26 or more) []  - 0 Hypo / Hyperglycemic Management (close monitor of Blood Glucose) []  - 0 Ankle / Brachial Index (ABI) - do not check if billed separately X- 1 5 Vital Signs Has the patient been seen at the hospital within the last three years: Yes Total Score: 90 Level Of Care: New/Established - Level 3 Electronic Signature(s) Signed: 07/31/2020 6:28:10 PM By: Baruch Gouty RN, BSN Entered By: Baruch Gouty on 07/31/2020 12:05:10 -------------------------------------------------------------------------------- Encounter Discharge Information Details Patient Name: Date of Service: [name] Pulse M. 07/31/2020 11:00 A M Medical Record Number: [id number] Patient Account Number: [id number] Date of Birth/Sex: Treating RN: Jul 14, 1950 (70 y.o. Sue Lush Primary Care Darry Kelnhofer: [name] Other Clinician: Referring Sebrina Kessner: Treating Shadi Larner/Extender: [name] in Treatment: 94 Encounter Discharge Information Items Discharge Condition: Stable Ambulatory Status: Wheelchair Discharge Destination: Home Transportation: Private Auto Accompanied By: Husband Schedule Follow-up Appointment: Yes Clinical Summary of Care: Provided on 07/31/2020 Form Type Recipient Paper Patient Patient Electronic Signature(s) Signed: 07/31/2020 12:20:56 PM By: Lorrin Jackson Entered By: Lorrin Jackson on 07/31/2020 12:20:56 -------------------------------------------------------------------------------- Lower Extremity Assessment Details Patient Name: Date of Service: [name] 07/31/2020 11:00 A M Medical Record Number: [id number] Patient Account Number: [id number] Date of Birth/Sex: Treating RN: 1950-09-06 (70 y.o. Sue Lush Primary Care Kelley Knoth: [name] Other Clinician: Referring Remmy Riffe: Treating Savina Olshefski/Extender: Diamantina Providence Weeks in Treatment: 81 Electronic Signature(s) Signed: 07/31/2020 12:20:11 PM By: Lorrin Jackson Entered By: Lorrin Jackson on 07/31/2020 12:20:11 -------------------------------------------------------------------------------- Multi Wound Chart Details Patient Name: Date of Service: [name], [name]. 07/31/2020 11:00 A M Medical Record Number: [id number] Patient Account Number: [id number] Date of Birth/Sex: Treating RN: 17-Dec-1950 (70 y.o. Martyn Malay, Linda Primary Care Taytem Ghattas: [name] Other Clinician: Referring Kastiel Simonian: Treating Kelani Robart/Extender: [name] in Treatment: 19 Vital Signs Height(in): 63 Pulse(bpm): 88 Weight(lbs): 185 Blood Pressure(mmHg): 151/77 Body Mass Index(BMI): 33 Temperature(F): 98.6 Respiratory Rate(breaths/min): 18 Photos: [12:No Photos Left Ischial Tuberosity] [N/A:N/A N/A] Wound Location: [12:Pressure Injury] [N/A:N/A] Wounding Event: [12:Pressure Ulcer] [N/A:N/A] Primary Etiology: [12:Anemia, Hypertension, Type II]  [N/A:N/A] Comorbid History: [12:Diabetes, Osteoarthritis, Dementia, Paraplegia, Received Radiation 09/06/2018] [N/A:N/A] Date Acquired: [12:82] [N/A:N/A] Weeks of Treatment: [12:Open] [N/A:N/A] Wound Status: [12:2.6x1x1.3] [N/A:N/A] Measurements L x W x D (cm) [12:2.042] [N/A:N/A] A (cm) : rea [12:2.655] [N/A:N/A] Volume (cm) : [12:-765.30%] [N/A:N/A] % Reduction in A rea: [12:-1152.40%] [N/A:N/A] % Reduction in Volume: [12:Category/Stage IV] [N/A:N/A] Classification: [12:Medium] [N/A:N/A] Exudate A mount: [12:Serosanguineous] [N/A:N/A] Exudate Type: [12:red, brown] [N/A:N/A] Exudate Color: [12:Well defined, not attached] [N/A:N/A] Wound Margin: [12:Large (67-100%)] [N/A:N/A]  Granulation A mount: [12:Red, Pink] [N/A:N/A] Granulation Quality: [12:None Present (0%)] [N/A:N/A] Necrotic A mount: [12:Fat Layer (Subcutaneous Tissue): Yes N/A] Exposed Structures: [12:Fascia: No Tendon: No Muscle: No Joint: No Bone: No Small (1-33%)] [N/A:N/A] Treatment Notes Electronic Signature(s) Signed: 07/31/2020 12:22:54 PM By: [name] DO Signed: 07/31/2020 6:28:10 PM By: Baruch Gouty RN, BSN Entered By: [name] on 07/31/2020 12:12:25 -------------------------------------------------------------------------------- Multi-Disciplinary Care Plan Details Patient Name: Date of Service: [name], Molli Posey M. 07/31/2020 11:00 A M Medical Record Number: [id number] Patient Account Number: [id number] Date of Birth/Sex: Treating RN: [birth year] (70 y.o. [name] Primary Care Damion Kant: [name] Other Clinician: Referring Flonnie Wierman: Treating Adryan Druckenmiller/Extender: [name] in Treatment: 40 Active Inactive Wound/Skin Impairment Nursing Diagnoses: Knowledge deficit related to ulceration/compromised skin integrity Goals: Patient/caregiver will verbalize understanding of skin care regimen Date Initiated: 12/30/2018 Target Resolution Date: 08/28/2020 Goal Status:  Active Interventions: Assess patient/caregiver ability to obtain necessary supplies Assess patient/caregiver ability to perform ulcer/skin care regimen upon admission and as needed Assess ulceration(s) every visit Provide education on ulcer and skin care Treatment Activities: Skin care regimen initiated : 12/30/2018 Topical wound management initiated : 12/30/2018 Notes: Electronic Signature(s) Signed: 07/31/2020 6:28:10 PM By: Baruch Gouty RN, BSN Entered By: Baruch Gouty on 07/31/2020 12:04:12 -------------------------------------------------------------------------------- Pain Assessment Details Patient Name: Date of Service: [name] 07/31/2020 11:00 A M Medical Record Number: [id number] Patient Account Number: [id number] Date of Birth/Sex: Treating RN: [birth year] (70 y.o. Sue Lush Primary Care Ardith Test: [name] Other Clinician: Referring Jonee Lamore: Treating Candis Kabel/Extender: [name] in Treatment: 57 Active Problems Location of Pain Severity and Description of Pain Patient Has Paino No Site Locations Pain Management and Medication Current Pain Management: Electronic Signature(s) Signed: 07/31/2020 5:25:58 PM By: Lorrin Jackson Entered By: Lorrin Jackson on 07/31/2020 11:16:38 -------------------------------------------------------------------------------- Patient/Caregiver Education Details Patient Name: Date of Service: CHA V IS, Etoy M. 4/26/2022andnbsp11:00 [place] Record Number: [id number] Patient Account Number: [id number] Date of Birth/Gender: Treating RN: May 09, 1950 (70 y.o. [name] Primary Care Physician: [name] Other Clinician: Referring Physician: Treating Physician/Extender: [name] in Treatment: 21 Education Assessment Education Provided To: Patient Education Topics Provided Pressure: Methods: Explain/Verbal Responses: Reinforcements needed, State content  correctly Wound/Skin Impairment: Methods: Explain/Verbal Responses: Reinforcements needed, State content correctly Electronic Signature(s) Signed: 07/31/2020 6:28:10 PM By: Baruch Gouty RN, BSN Entered By: Baruch Gouty on 07/31/2020 12:04:34 -------------------------------------------------------------------------------- Wound Assessment Details Patient Name: Date of Service: [name] 07/31/2020 11:00 A M Medical Record Number: [id number] Patient Account Number: [id number] Date of Birth/Sex: Treating RN: [birth year] (70 y.o. Sue Lush Primary Care Jream Broyles: [name] Other Clinician: Referring Cearra Portnoy: Treating Prabhleen Montemayor/Extender: Diamantina Providence Weeks in Treatment: 82 Wound Status Wound Number: 12 Primary Pressure Ulcer Etiology: Wound Location: Left Ischial Tuberosity Wound Open Wounding Event: Pressure Injury Status: Date Acquired: 09/06/2018 Comorbid Anemia, Hypertension, Type II Diabetes, Osteoarthritis, Weeks Of Treatment: 82 History: Dementia, Paraplegia, Received Radiation Clustered Wound: No Photos Wound Measurements Length: (cm) 2.6 Width: (cm) 1 Depth: (cm) 1.3 Area: (cm) 2.042 Volume: (cm) 2.655 % Reduction in Area: -765.3% % Reduction in Volume: -1152.4% Epithelialization: Small (1-33%) Tunneling: No Undermining: No Wound Description Classification: Category/Stage IV Wound Margin: Well defined, not attached Exudate Amount: Medium Exudate Type: Serosanguineous Exudate Color: red, brown Foul Odor After Cleansing: No Slough/Fibrino No Wound Bed Granulation Amount: Large (67-100%) Exposed Structure Granulation Quality: Red, Pink Fascia Exposed: No Necrotic Amount: None Present (0%) Fat Layer (  Subcutaneous Tissue) Exposed: Yes Tendon Exposed: No Muscle Exposed: No Joint Exposed: No Bone Exposed: No Treatment Notes Wound #12 (Ischial Tuberosity) Wound Laterality: Left Cleanser Soap and Water Discharge  Instruction: May shower and wash wound with dial antibacterial soap and water prior to dressing change. Wound Cleanser Discharge Instruction: Cleanse the wound with wound cleanser prior to applying a clean dressing using gauze sponges, not tissue or cotton balls. Peri-Wound Care Skin Prep Discharge Instruction: Use skin prep as directed Topical Primary Dressing Promogran Prisma Matrix, 4.34 (sq in) (silver collagen) Discharge Instruction: Moisten collagen with saline or hydrogel. Apply moistened gauze on top of prisma. Secondary Dressing ComfortFoam Border, 4x4 in (silicone border) Discharge Instruction: Apply over primary dressing as directed. Secured With Compression Wrap Compression Stockings Environmental education officer) Signed: 07/31/2020 5:14:41 PM By: Sandre Kitty Signed: 07/31/2020 5:25:58 PM By: Lorrin Jackson Entered By: Sandre Kitty on 07/31/2020 16:33:12 -------------------------------------------------------------------------------- Vitals Details Patient Name: Date of Service: [name], [name]. 07/31/2020 11:00 A M Medical Record Number: [id number] Patient Account Number: [id number] Date of Birth/Sex: Treating RN: 02/13/1951 (70 y.o. Sue Lush Primary Care Josslynn Mentzer: [name] Other Clinician: Referring Yuvonne Lanahan: Treating Kassem Kibbe/Extender: [name] in Treatment: 71 Vital Signs Time Taken: 11:11 Temperature (F): 98.6 Height (in): 63 Pulse (bpm): 88 Weight (lbs): 185 Respiratory Rate (breaths/min): 18 Body Mass Index (BMI): 32.8 Blood Pressure (mmHg): 151/77 Reference Range: 80 - 120 mg / dl Electronic Signature(s) Signed: 07/31/2020 5:25:58 PM By: Lorrin Jackson Entered By: Lorrin Jackson on 07/31/2020 11:16:28

## 2020-07-31 NOTE — Progress Notes (Signed)
[name], [name] ([id number]) Visit Report for 07/31/2020 Chief Complaint Document Details Patient Name: Date of Service: [name], DAISE 07/31/2020 11:00 A M Medical Record Number: [id number] Patient Account Number: [id number] Date of Birth/Sex: Treating RN: 28-Dec-1950 (70 y.o. [name] Primary Care Provider: [name] Other Clinician: Referring Provider: Treating Provider/Extender: [name] in Treatment: 41 Information Obtained from: Patient Chief Complaint She is here for follow up evaluation of left ischial pressure ulcer 12/30/2018; patient comes back for review of wounds in the same general area over the previously healed left ischial pressure ulcer Electronic Signature(s) Signed: 07/31/2020 12:22:54 PM By: [name] DO Entered By: [name] on 07/31/2020 12:13:27 -------------------------------------------------------------------------------- HPI Details Patient Name: Date of Service: [name] Pulse M. 07/31/2020 11:00 A M Medical Record Number: [id number] Patient Account Number: [id number] Date of Birth/Sex: Treating RN: [birth year] (70 y.o. [name] Primary Care Provider: [name] Other Clinician: Referring Provider: Treating Provider/Extender: [name] in Treatment: 45 History of Present Illness Location: open ulceration of the left gluteal area, left heel and right ankle for about 5 months. Quality: Patient reports No Pain. Severity: Patient states wound(s) are getting worse. Duration: Patient has had the wound for > 5 months prior to seeking treatment at the wound center Context: The wound occurred when the patient has been paraplegic for about 3 years. Modifying Factors: Wound improving due to current treatment. ssociated Signs and Symptoms: Patient reports having foul odor. A HPI Description: this 70 year old patient who is known to have hypertension, hypothyroidism, breast cancer,  chronic pain syndrome, paraplegia was noted to have a left gluteal decubitus ulcer and was brought into the hospital. During the course of her hospitalization she was debrided in the operating room by ankle wound. Bone cultures were taken at that time but were negative but clinically she was treated for osteomyelitis because of the probing down to bone and open exposed bone. Home health has been giving her antibioticss which include vancomycin and Zosyn. The patient was a smoker until about 3 weeks ago and used to smoke about 10 cigarettes a day for a long while. 12/13/2014 - details of her operative note from 11/03/2014 were reviewed -- PROCEDURE: 1. Excisional debridement skin, subcutaneous, muscle left ischium 35 cm2 2. Excisional debridement skin, subcutaneous tissue left heel 27 cm2 3. Excisional debridement right ankle skin, subcutaneous, bone 30 cm2 01/24/2015 -- she has some issues with her wheelchair cushion but other than that is doing very well and has received Podus boots for her feet. 02/14/2015 -- she was using her old offloading boots and this seemed to have caused her a new pressure ulcer on the left posterior heel near the superior part just below the Achilles tendon. 03/07/2015 -- she has a new ulceration just to the left of the midline on her sacral region more on the left buttock and this has been there for Dr. Leland Johns and had all the wounds sharply debrided. The debridement was done for the left ischial wound, the left heel wound and the right about a week. 08/22/2015 -- was recently admitted to hospital between May 5 and 08/13/2015, with sepsis and leukocytosis due to a UTI. she was treated for a sepsis complicating Escherichia coli UTI and kidney stones. She also had metabolic and careful up at the secondary to pyelonephritis. He received broad-spectrum antibiotics initially and then received Macrobid as per urology. She was sent home on nitrofurantoin. during her admission she  had  a CT scan which showed exposed left ischial tuberosity without evidence of osteolysis. 09/12/2015-- the patient is having some issues with her air mattress and would like to get a opinion from medical modalities. 10/10/2015 -- the issue with her air mattress has not yet been sorted out and the new problem seems to be a lot of odor from the wound VAC. 11/27/2015 -- the patient was admitted to the hospital between July 23 and 10/31/2015. Her problems were sepsis, osteomyelitis of the pelvic bone and acute pyelonephritis. CT of the abdomen and pelvis was consistent with a left-sided pyelonephritis with hydronephrosis and also just showed new sclerosis of the posterior portion of the left anterior pubic ramus suggestive of periosteal reaction consistent with osteomyelitis. She was treated for the osteomyelitis with infectious disease consult recommending 6 weeks of IV antibiotics including vancomycin and Rocephin and the antibiotics were to go on until 12/10/2015. He was seen by Dr. Iran Planas plastic surgery and Dr. Linus Salmons of infectious disease. She had a suprapubic catheter placed during the admission. CT scan done on 10/28/2015 showed specifically -- New sclerosis of the posterior portion of the left inferior pubic ramus with aggressive periosteal reaction, consistent with osteomyelitis, with adjacent soft tissue gas compatible with previously described decubitus ulcer. 12/12/2015 -- she was recently seen by Dr. Linus Salmons, who noted good improvement and CRP and ESR compared to before and he has stopped her antibiotic as per plans to finish on September 4. The patient was encouraged to continue with wound care and consider hyperbaric oxygen therapy. Today she tells me that she has consented to undergo hyperbaric oxygen therapy and we can start the paperwork. 01/02/2016 -- her PCP had gained about 3 years but she still persists in having problems during hyperbaric oxygen therapy with some discomfort in the  ears. 01/09/16; pressure area with underlying osteomyelitis in the left buttock. Wound bed itself has some slight amount of grayish surface slough however I do not think any debridement was necessary. There is no exposed bone soft tissue appears stable. She is using a wound VAC 01/16/16; back for weekly wound review in conjunction with HBO. She has a deep wound over the left initial tuberosity previously treated with 6 weeks of IV antibiotics for osteomyelitis. Wound bed looks reasonably healthy although the base of this is still precariously close to bone. She has been using a wound VAC. 01/23/2016 -- she has completed her course of antibiotics and this week the only new thing is her right great toe nail was avulsed and she has got an open wound over the nailbed. 01/31/16 she has completed her course of antibiotics. Her right great toenail avulsed last week and she's been using silver alginate for this as well. Still using a wound VAC to the substantial stage IV wound over the left ischial tuberosity 03/05/2016 -- the patient has had a opinion from the plastic surgery group at [facility] and details of this are not available yet but the patient's verbal report has been heard by me. Did not sound like there was any optimistic discussion regarding reconstruction and the net result would be to continue with the wound VAC application. I will await the official reports. Addendum: -- she was seen at [facility] surgery service by Dr. Tressa Busman. After a thorough review and from what I understand spending 45 minutes with the patient his assessment has been noted by me in detail and the management options were: 1. Continued pressure offloading and wound care  versus operative procedures including wound excision 2. Soft tissue and bone sampling 3. If the wound gets larger wound closure would be done using a variety of plastic surgical techniques including but not limited to skin  substitute, possible skin graft, local versus regional flaps, negative pressure dressing application. 4. He discussed with her details of flap surgery and the risks associated 5. He made a comment that since the patient was operated on by Dr. Leland Johns of [facility] plastic surgery unit in [place] the patient may continue to follow-up there for further evaluation for surgical flap closure in the future. 03/19/2016 -- the patient continues to be rather depressed and frustrated with her lack of rapid progress in healing this wound especially because she thought after hyperbaric oxygen therapy the wound would heal extremely fast. She now understands that was not the implied benefit on wound care which was the recommendation for hyperbaric oxygen therapy. I have had a lengthy discussion with the patient and her husband regarding her options: 1. Continue with collagen and wound VAC for the primary dressing and offloading and all supportive care. 2. See Dr. Iran Planas for possible placement of Acell or Integra in the OR. 3. get a second opinion from a wound care center and surrounding regions/counties 05/07/2016 -- Note from Dr. Celedonio Miyamoto, who noted that the patient has declined flap surgery. She has discussed application of A cell, and try a few applications to see how the wound progresses. She is also recommended that we could apply products here in the wound center, like Oasis. during her preop workup it was found that her hemoglobin A1c was 11% and she has now been diagnosed as having diabetes mellitus and has been put on appropriate treatment by her PCP 05/28/2016 -- tells me her blood sugars have been doing well and she has an appointment to see her PCP in the next couple of weeks to check her hemoglobin A1c. Other than that she continues to do well. 06/25/2016 -- have not seen her back for the last month but she says her health has been about the same and she has an appointment  to check the A1c next week 09/10/16 ---- was seen by Dr. Celedonio Miyamoto -- who applied Acell and saw her back in follow-up. She has recommended silver alginate to the wound every other day and cover with foam. If no significant drainage could transition to collagen every other day. She recommended discontinuing wound VAC. There were no plans to repeat application of Acell. The patient expressed that her husband could do the wound care as going to the Wound Ctr., would cost several $100 for each visit. 10/21/2016 -- her insurance company is getting her new mattress and she is pleased about that. Other than that she has been doing dressings with PolyMem Silver and has been doing very well 02/18/2017 -- she has gone through several changes of her mattress and has not been pleased with any of them. The ventricles are still working on trying to fit her with the appropriate low air-loss mattress. She has a new wound on the gluteal area which is clearly separated from the original wound. 03/25/17-she is here in follow-up evaluation for her left ischialpressure ulcer. She remains unsatisfied with her pressure mattress. She admits to sitting multiple hours a day, in the bed. We have discussed offloading options. The wound does not appear infected. Nutrition does not appear to be a concern. Will follow-up in 4 weeks, if wound continues to be stalled may  consider x-ray to evaluate for refractory osteomyelitis. 04/21/17; this is a patient that I don't know all that well. She has a chronic wound which at one point had underlying osteomyelitis in the left ischial tuberosity. This is a stage IV pressure ulcer. Over the last 3 months she has a stage II wound inferiorly to the original wound. The last time she was here her dressing was changed to silver collagen although the patient's husband who changes the dressing said that the collagen stuck to the wound and remove skin from the superficial area therefore he  switched back to [place] 05/13/17; this is a patient we've been following for a left ischial tuberosity wound which was stage IV at one point had underlying osteomyelitis. Over the last several months she's had a stage II wound just inferior and medial to the related to the wound. According to her husband he is using Endoform layer with collagen although this is not what I had last time. According to her husband they are using Elgie [place] with collagen although I don't quite know how that started. She was hospitalized from 1/20 through 04/30/16. This was related to a UTI. Her blood cultures were negative, urine culture showed multiple species. She did have a CT scan of the abdomen and pelvis which documented chronic osteomyelitis in the area of the wound inflammatory markers were unremarkable. She has had prior knowledge of osteomyelitis. It looks as though she received IV antibiotics in 2017 and was treated with a course of hyperbaric oxygen. 05/28/17; the wound over the left ischial tuberosity is deeper today and abuts clearly on bone. Nursing intake reported drainage. I therefore culture of the wound. The more superficial area just below this looks about the same. They once again complained that there are mattress cover is not working although apparently [facility] is been noted to see this many times in the report is that the device is functional 06/18/17; the patient had a probing area on the left ischial tuberosity that was draining purulent fluid last time. This also clearly seemed to have open bone. Culture I did showed pansensitive pseudomonas including third generation cephalosporins. I treated this with cefdinir 300 twice a day for 10 days and things seem to have improved. She has a more superficial wound just underneath this area. Amazingly she has a new air mattress through [facility]. I think they gave this to her as a parking give. In any case this now works according  to the patient may have something to do with why the areas are looking better. 07/09/17; the patient has a probing area in the left ischial tuberosity that still has some depth. However this is contracted in terms of the wound orifice although the depth is still roughly the same. There is no undermining. She also has the satellite wound which is more superficial. This appears to have a healthy surface we've been using silver collagen 08/06/17; the patient's wound is over the left ischial tuberosity and a satellite lesion just underneath this. The original wound was actually a deep stage 4 wound. We have made good progress in 2 months and there is no longer exposed bone here. 09/03/17; left ischial tuberosity actually appears to be quite healthy. I think we are making progress. No debridement is required. There is no surrounding erythema 10/01/17 I follow this patient monthly for her left ischial tuberosity wound. There is 2 areas the original area and a satellite area. The satellite area looks a lot better  there is no surrounding erythema. Her husband relates that he is having trouble maintaining the dressing. This has to do with the soft tissue around it. He states he puts the collagen in but he cannot make sure that it stays in even with the ABD pads and tape that he is been using 10/29/17; patient arrives with a better looking noon today. Some of the satellite lesions have closed. using Prisma 11/26/17; the patient has a large cone-shaped area with the tip of the Cone deep within her buttock soft tissue. The walls of the Cone are epithelialized however the base is still open. The area at the base of this looks moist we've been using silver collagen. Will change to silver alginate 12/31/2017; the wound appears to have come in fairly nicely. Using silver alginate. There is no surrounding maceration or infection 01/28/18; there is still an open area here over the left initial tuberosity. Base of this however  looks healthy. There is no surrounding infection 02/25/18; the area of its open is over the left ischial tuberosity. The base of this is where the wound is. This is a large inverted cone-shaped area with the wound at the tip. Dimensions of the wound at the tip are improved. There is a area of denuded skin about halfway towards the tip which her husband thinks may have happened today when he was bathing her. 04/20/17; the area is still open over the left initial tuberosity. 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 This is now completely closed over although it is closed over with a divot and skin over bone at the base of this. Continued aggressive offloading will be necessary. 12/30/2018 READMISSION This is a 70 year old woman with chronic paraplegia. I picked her up for her care from Dr. Con Memos in this clinic after he departed. She had a stage IV pressure wound over the left ischial tuberosity. She was treated twice for her underlying osteomyelitis and this I believe firstly in 2016 and again in 2017. There were some plans at some point for flap closure of this however she was discovered to have uncontrolled diabetes and I do not think this was ever accomplished. She ultimately healed over in this clinic and was discharged in May. She has a large cone-shaped indentation with the tip of this going towards the left ischial tuberosity. It is not an easy area to examine but at that time I thought all of this was epithelialized. Apparently there was a reopening here shortly after she left the clinic last time. She was admitted to hospital at the end of June for Klebsiella bacteremia felt to be secondary to UTI. A CT scan of the pelvis is listed below and there was initially some concern that she had underlying  osteomyelitis although I believe she was seen by infectious disease and that was felt to be not the case: I do not see any new cultures or inflammatory markers IMPRESSION: 1. No CT evidence for acute intra-abdominal or pelvic abnormality. Large volume of stool throughout the colon. 2. Enlarged fatty liver with fat sparing near the gallbladder fossa 3. Cortical scarring right kidney. Bilateral intrarenal stones without hydronephrosis. Thick-walled urinary bladder decompressed by suprapubic catheter 4. Deep left decubitus ulcer with underlying left ischial changes suggesting osteomyelitis. Her husband has been using silver collagen  in the wound. She has not been systemically unwell no fever chills eating and drinking well. They rigorously offload this wound only getting up in the wheelchair when she is going to appointments the rest of the time she is in bed. 10/8; wound measures larger and she now has exposed bone. We have been using silver alginate 11/12 still using silver alginate. The patient saw Dr. Megan Salon of infectious disease. She was started on Augmentin 500 mg twice daily. She is due to follow-up with Dr. Megan Salon I believe next week. Lab work Dr. Megan Salon requested showed a sedimentation rate of 28 and CRP of 20 although her CRP 1 year ago was 18.8. Sedimentation rate 1 year ago was 11 basic metabolic panel showed a creatinine of 1.12 12/3; the patient followed up with Dr. Megan Salon yesterday. She is still on Augmentin twice daily. This was directed by Dr. Megan Salon. The patient's inflammatory markers have improved which is gratifying. Her C-reactive protein was repeated yesterday and follow-up booked with infectious disease in January. In addition I have been getting secure text messages I think from palliative care through [facility] [employer]. I think they were hoping to provide services to the patient in her home. They could not get a hold of the primary physician  and so they reached out to me on 2 separate occasions. 12/17; patient last saw Dr. Megan Salon on 12/2. She is finishing up with Augmentin. Her C-reactive protein was 20 on 10/21, 10.1 on 11/19 and 17 on 12/2. The wound itself still has depth and undermining. We are using Santyl with the backing wet-to-dry 04/27/2019. The wound is gradually clearing up in terms of the surface although it is not filled in that much. Still abuts right against bone 2/4; patient with a deep pressure ulcer over the left ischial tuberosity. I thought she was going to follow-up with infectious disease to follow her inflammatory markers although the patient states that they stated that they did not need to see her unless we felt it was necessary. I will need to check their notes. In any case we ordered moistened silver collagen back with wet-to-dry to fill in the depth of the wound although apparently [facility] sent silver alginate which they have been using since they were here the last time. Is obviously not what we ordered. 2/25. Not much change in this wound it is over the left ischial tuberosity recurrent wound. We have been using silver collagen with backing wet-to-dry. I think the wound is about the same. There is still some tunneling from about 10-12 o'clock over the ischial tuberosity itself 3/11; pressure ulcer over the left ischial tuberosity. Since she was last here the wound VAC was started and apparently going quite well. We are able to get the home health company that accepts [place] healthcare which is in itself sometimes problematic. There is been improvements in the wound the tunneling seems to be better and is contracted nicely 4/8; 1 month follow-up. Since she was last here we have been using silver collagen under a wound VAC. Some minor contraction I think in wound volume. She is cared for diligently by her husband including pressure relief, incontinence management, nutritional support etc. 6/1; this is almost a  25-monthfollow-up. She is been using silver collagen under wound VAC. Circular area over the left ischial tuberosity. She has been using silver collagen under wound VAC 7/8; 1 month follow-up. Silver collagen under the VAC not really a lot of progress. Tissue at the base  of the wound which is right against bone and the tissue next that this does not look completely viable. She is not currently on any antibiotics, she had underlying osteomyelitis I need to look this over 8/16; we are using silver collagen under wound VAC to the left ischial tuberosity wound. Comes in today with absolutely no change in surface area or depth. There is no exposed bone. I did look over her infectious disease notes as I said I would do last time. She last saw Dr. Megan Salon in December 2020. She completed 6 weeks of Augmentin. This was in response to a bone culture I did showing methicillin susceptible staph aureus and Enterococcus. She was supposed to come back to see Dr. Megan Salon at some point although they say that that appointment was canceled unless I chose to recommend return. I think there was supposed to be follow-up with inflammatory markers but I cannot see that that was ever done. She has not been on antibiotics since 9/21; monthly follow-up. We received a call from home health nurse last evening to report green drainage coming out of the wound. Lab work I ordered last time showed a white count of 5.2 a sedimentation rate of 45 and a C-reactive protein of 25 however neither one of the 2 values are substantially different from her previous values in October 2020 or December 2020. Both are slightly higher but only marginally. Otherwise no new complaints from the patient or her husband 10/19; 1 month follow-up. PCR culture I did last time showed medium quantities of Pseudomonas lower quantities Klebsiella and Enterococcus faecalis group B strep and Peptostreptococcus. I gave her Augmentin for 2 weeks. I am not really  sure of my choice of this I would not cover Pseudomonas. She is still having green drainage. Wound itself looks satisfactory there is not a lot of depth wound bed looks healthy 11/16; patient has completed the antibiotics still using gentamicin and silver alginate on the wound. There is improvement in the surface area 12/21; in general the area on the buttock looks somewhat better. Surface looks healthy although I do not know that there is been much improvement in the wound volume. We have been using silver alginate and Hydrofera Blue. Less drainage. In passing the husband showed me an abrasion injury on the left anterior tibia. Covered in necrotic surface. He has noticed this for about a week and has been putting silver collagen on it. He is completely uncertain about how this happened 1/25; monthly follow-up. The area on the left buttock is about the same. This does not go to bone but a fairly deep wound surface of the wound is of questionable viability. The abrasion injury that they showed me last time apparently was closed out by home health because they thought it was healed but certainly is not although it is just about healed. As a result they haven't been applying anything to this area Finally I did discuss with the patient and her husband the idea of an advanced treatment product to try and get a proper base to this wound I was thinking of Puraply however actually the patient points out that her co-pay for coming to visit [place] i.e. the facility, charge would be unaffordable if they have to, on a weekly basis 2/22; pressure ulcer on the left buttock appears deeper to me and abuts on the ischial tuberosity. I thought initially there was exposed bone but there is a rim of tissue over this area. She also has a superficial  over the right anterior mid tibia. Been using silver collagen to these areas without much success. I have looked over the patient's past history with regards to the area on the  left ischium. She did have underlying osteomyelitis here dating back I think to late 2020. She saw Dr. Megan Salon she received a 6-week course of oral antibiotics in response to a bone culture that I did. This does not appear to be infected but it certainly has not been improving in terms of granulation. I do not believe she has had any recent imaging studies 3/29; 1 month follow-up. Pressure ulcer on the left buttock which she has been dealing with with for a number of years. She was treated for underlying osteomyelitis at 1.2 or 3 years ago I think with infectious disease help. She also had I think a flap closure by Dr. Leland Johns and that lasted for about a year and then reopened. I have not been able to get this patient to progress towards healing although truthfully the wound is absolutely no worse. We have been using Hydrofera Blue 4/26; patient presents for 1 week follow-up. She has been using silver collagen to the area every other day. She has home health that comes out once a week to help with dressing changes as well. The patient is interested in trying a skin substitute over this area. She states she is trying to relieve pressure off of it most of the day. Electronic Signature(s) Signed: 07/31/2020 12:22:54 PM By: [name] DO Entered By: [name] on 07/31/2020 12:15:28 -------------------------------------------------------------------------------- Physical Exam Details Patient Name: Date of Service: [name]. 07/31/2020 11:00 A M Medical Record Number: [id number] Patient Account Number: [id number] Date of Birth/Sex: Treating RN: 09-Dec-1950 (70 y.o. [name] Primary Care Provider: [name] Other Clinician: Referring Provider: Treating Provider/Extender: [name] in Treatment: 35 Constitutional respirations regular, non-labored and within target range for patient.Marland Kitchen Psychiatric pleasant and cooperative. Notes Sacrum: Left  ischial tuberosity shows healthy looking tissue at the base. There is no exposed bone or evidence of soft tissue infection. The wound looks clean. Electronic Signature(s) Signed: 07/31/2020 12:22:54 PM By: [name] DO Entered By: [name] on 07/31/2020 12:17:03 -------------------------------------------------------------------------------- Physician Orders Details Patient Name: Date of Service: [name], [name]. 07/31/2020 11:00 A M Medical Record Number: [id number] Patient Account Number: [id number] Date of Birth/Sex: Treating RN: 1951-03-26 (70 y.o. Martyn Malay, Vaughan Basta Primary Care Provider: [name] Other Clinician: Referring Provider: Treating Provider/Extender: [name] in Treatment: 57 Verbal / Phone Orders: No Diagnosis Coding ICD-10 Coding Code Description 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, stage 4 E11.622 Type 2 diabetes mellitus with other skin ulcer S80.811D Abrasion, right lower leg, subsequent encounter L97.811 Non-pressure chronic ulcer of other part of right lower leg limited to breakdown of skin Follow-up Appointments Return appointment in 1 month. Cellular or Tissue Based Products Other Cellular or Tissue Based Products Orders/Instructions: - run IVR for oasis and Theme park manager Hygiene May shower and wash wound with soap and water. - with dressingchanges Edema Control - Lymphedema / SCD / Other Elevate legs to the level of the heart or above for 30 minutes daily and/or when sitting, a frequency of: Avoid standing for long periods of time. Off-Loading Turn and reposition every 2 hours Home Health No change in wound care orders this week; continue Home Health for wound care. May utilize formulary equivalent dressing for wound treatment orders unless otherwise specified. Other Home  Health Orders/Instructions: - [facility] Wound #12 - Ischial Tuberosity Wound Laterality: Left Cleanser: Soap  and Water [facility]) Every Other Day/30 Days Discharge Instructions: May shower and wash wound with dial antibacterial soap and water prior to dressing change. Cleanser: Wound Cleanser [facility]) Every Other Day/30 Days Discharge Instructions: Cleanse the wound with wound cleanser prior to applying a clean dressing using gauze sponges, not tissue or cotton balls. Peri-Wound Care: Skin Prep [facility]) Every Other Day/30 Days Discharge Instructions: Use skin prep as directed Prim Dressing: Promogran Prisma Matrix, 4.34 (sq in) (silver collagen) (Home Health) Every Other Day/30 Days ary Discharge Instructions: Moisten collagen with saline or hydrogel. Apply moistened gauze on top of prisma. Secondary Dressing: ComfortFoam Border, 4x4 in (silicone border) [facility]) Every Other Day/30 Days Discharge Instructions: Apply over primary dressing as directed. Electronic Signature(s) Signed: 07/31/2020 12:22:54 PM By: [name] DO Signed: 07/31/2020 6:28:10 PM By: Baruch Gouty RN, BSN Entered By: Baruch Gouty on 07/31/2020 12:15:58 -------------------------------------------------------------------------------- Problem List Details Patient Name: Date of Service: [name], [name]. 07/31/2020 11:00 A M Medical Record Number: [id number] Patient Account Number: [id number] Date of Birth/Sex: Treating RN: 1950/04/12 (70 y.o. [name] Primary Care Provider: [name] Other Clinician: Referring Provider: Treating Provider/Extender: [name] in Treatment: 68 Active Problems ICD-10 Encounter Encounter Code Description Active Date MDM Diagnosis 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, stage 4 12/30/2018 No Yes E11.622 Type 2 diabetes mellitus with other skin ulcer 12/30/2018 No Yes S80.811D Abrasion, right lower leg, subsequent encounter 03/27/2020 No Yes L97.811 Non-pressure chronic ulcer of other part of right lower leg limited to breakdown  03/27/2020 No Yes of skin Inactive Problems ICD-10 Code Description Active Date Inactive Date G82.20 Paraplegia, unspecified 12/30/2018 12/30/2018 M86.68 Other chronic osteomyelitis, other site 02/17/2019 02/17/2019 Resolved Problems Electronic Signature(s) Signed: 07/31/2020 12:22:54 PM By: [name] DO Entered By: [name] on 07/31/2020 12:12:18 -------------------------------------------------------------------------------- Progress Note Details Patient Name: Date of Service: [name]. 07/31/2020 11:00 A M Medical Record Number: [id number] Patient Account Number: [id number] Date of Birth/Sex: Treating RN: Oct 19, 1950 (70 y.o. Martyn Malay, Vaughan Basta Primary Care Provider: [name] Other Clinician: Referring Provider: Treating Provider/Extender: [name] in Treatment: 19 Subjective Chief Complaint Information obtained from Patient She is here for follow up evaluation of left ischial pressure ulcer 12/30/2018; patient comes back for review of wounds in the same general area over the previously healed left ischial pressure ulcer History of Present Illness (HPI) The following HPI elements were documented for the patient's wound: Location: open ulceration of the left gluteal area, left heel and right ankle for about 5 months. Quality: Patient reports No Pain. Severity: Patient states wound(s) are getting worse. Duration: Patient has had the wound for > 5 months prior to seeking treatment at the wound center Context: The wound occurred when the patient has been paraplegic for about 3 years. Modifying Factors: Wound improving due to current treatment. Associated Signs and Symptoms: Patient reports having foul odor. this 70 year old patient who is known to have hypertension, hypothyroidism, breast cancer, chronic pain syndrome, paraplegia was noted to have a left gluteal decubitus ulcer and was brought into the hospital. During the course of  her hospitalization she was debrided in the operating room by ankle wound. Bone cultures were taken at that time but were negative but clinically she was treated for osteomyelitis because of the probing down to bone and open exposed bone. Home health has been  giving her antibioticss which include vancomycin and Zosyn. The patient was a smoker until about 3 weeks ago and used to smoke about 10 cigarettes a day for a long while. 12/13/2014 - details of her operative note from 11/03/2014 were reviewed -- PROCEDURE: 1. Excisional debridement skin, subcutaneous, muscle left ischium 35 cm2 2. Excisional debridement skin, subcutaneous tissue left heel 27 cm2 3. Excisional debridement right ankle skin, subcutaneous, bone 30 cm2 01/24/2015 -- she has some issues with her wheelchair cushion but other than that is doing very well and has received Podus boots for her feet. 02/14/2015 -- she was using her old offloading boots and this seemed to have caused her a new pressure ulcer on the left posterior heel near the superior part just below the Achilles tendon. 03/07/2015 -- she has a new ulceration just to the left of the midline on her sacral region more on the left buttock and this has been there for Dr. Leland Johns and had all the wounds sharply debrided. The debridement was done for the left ischial wound, the left heel wound and the right about a week. 08/22/2015 -- was recently admitted to hospital between May 5 and 08/13/2015, with sepsis and leukocytosis due to a UTI. she was treated for a sepsis complicating Escherichia coli UTI and kidney stones. She also had metabolic and careful up at the secondary to pyelonephritis. He received broad-spectrum antibiotics initially and then received Macrobid as per urology. She was sent home on nitrofurantoin. during her admission she had a CT scan which showed exposed left ischial tuberosity without evidence of osteolysis. 09/12/2015-- the patient is having some issues  with her air mattress and would like to get a opinion from medical modalities. 10/10/2015 -- the issue with her air mattress has not yet been sorted out and the new problem seems to be a lot of odor from the wound VAC. 11/27/2015 -- the patient was admitted to the hospital between July 23 and 10/31/2015. Her problems were sepsis, osteomyelitis of the pelvic bone and acute pyelonephritis. CT of the abdomen and pelvis was consistent with a left-sided pyelonephritis with hydronephrosis and also just showed new sclerosis of the posterior portion of the left anterior pubic ramus suggestive of periosteal reaction consistent with osteomyelitis. She was treated for the osteomyelitis with infectious disease consult recommending 6 weeks of IV antibiotics including vancomycin and Rocephin and the antibiotics were to go on until 12/10/2015. He was seen by Dr. Iran Planas plastic surgery and Dr. Linus Salmons of infectious disease. She had a suprapubic catheter placed during the admission. CT scan done on 10/28/2015 showed specifically -- New sclerosis of the posterior portion of the left inferior pubic ramus with aggressive periosteal reaction, consistent with osteomyelitis, with adjacent soft tissue gas compatible with previously described decubitus ulcer. 12/12/2015 -- she was recently seen by Dr. Linus Salmons, who noted good improvement and CRP and ESR compared to before and he has stopped her antibiotic as per plans to finish on September 4. The patient was encouraged to continue with wound care and consider hyperbaric oxygen therapy. Today she tells me that she has consented to undergo hyperbaric oxygen therapy and we can start the paperwork. 01/02/2016 -- her PCP had gained about 3 years but she still persists in having problems during hyperbaric oxygen therapy with some discomfort in the ears. 01/09/16; pressure area with underlying osteomyelitis in the left buttock. Wound bed itself has some slight amount of grayish surface  slough however I do not think any debridement was necessary.  There is no exposed bone soft tissue appears stable. She is using a wound VAC 01/16/16; back for weekly wound review in conjunction with HBO. She has a deep wound over the left initial tuberosity previously treated with 6 weeks of IV antibiotics for osteomyelitis. Wound bed looks reasonably healthy although the base of this is still precariously close to bone. She has been using a wound VAC. 01/23/2016 -- she has completed her course of antibiotics and this week the only new thing is her right great toe nail was avulsed and she has got an open wound over the nailbed. 01/31/16 she has completed her course of antibiotics. Her right great toenail avulsed last week and she's been using silver alginate for this as well. Still using a wound VAC to the substantial stage IV wound over the left ischial tuberosity 03/05/2016 -- the patient has had a opinion from the plastic surgery group at [facility] and details of this are not available yet but the patient's verbal report has been heard by me. Did not sound like there was any optimistic discussion regarding reconstruction and the net result would be to continue with the wound VAC application. I will await the official reports. Addendum: -- she was seen at [place] surgery service by Dr. Tressa Busman. After a thorough review and from what I understand spending 45 minutes with the patient his assessment has been noted by me in detail and the management options were: 1. Continued pressure offloading and wound care versus operative procedures including wound excision 2. Soft tissue and bone sampling 3. If the wound gets larger wound closure would be done using a variety of plastic surgical techniques including but not limited to skin substitute, possible skin graft, local versus regional flaps, negative pressure dressing application. 4. He discussed with her details of flap  surgery and the risks associated 5. He made a comment that since the patient was operated on by Dr. Leland Johns of [facility] plastic surgery unit in [place] the patient may continue to follow-up there for further evaluation for surgical flap closure in the future. 03/19/2016 -- the patient continues to be rather depressed and frustrated with her lack of rapid progress in healing this wound especially because she thought after hyperbaric oxygen therapy the wound would heal extremely fast. She now understands that was not the implied benefit on wound care which was the recommendation for hyperbaric oxygen therapy. I have had a lengthy discussion with the patient and her husband regarding her options: 1. Continue with collagen and wound VAC for the primary dressing and offloading and all supportive care. 2. See Dr. Iran Planas for possible placement of Acell or Integra in the OR. 3. get a second opinion from a wound care center and surrounding regions/counties 05/07/2016 -- Note from Dr. Celedonio Miyamoto, who noted that the patient has declined flap surgery. She has discussed application of A cell, and try a few applications to see how the wound progresses. She is also recommended that we could apply products here in the wound center, like Oasis. during her preop workup it was found that her hemoglobin A1c was 11% and she has now been diagnosed as having diabetes mellitus and has been put on appropriate treatment by her PCP 05/28/2016 -- tells me her blood sugars have been doing well and she has an appointment to see her PCP in the next couple of weeks to check her hemoglobin A1c. Other than that she continues to do well. 06/25/2016 --  have not seen her back for the last month but she says her health has been about the same and she has an appointment to check the A1c next week 09/10/16 ---- was seen by Dr. Celedonio Miyamoto -- who applied Acell and saw her back in follow-up. She has recommended  silver alginate to the wound every other day and cover with foam. If no significant drainage could transition to collagen every other day. She recommended discontinuing wound VAC. There were no plans to repeat application of Acell. The patient expressed that her husband could do the wound care as going to the Wound Ctr., would cost several $100 for each visit. 10/21/2016 -- her insurance company is getting her new mattress and she is pleased about that. Other than that she has been doing dressings with PolyMem Silver and has been doing very well 02/18/2017 -- she has gone through several changes of her mattress and has not been pleased with any of them. The ventricles are still working on trying to fit her with the appropriate low air-loss mattress. She has a new wound on the gluteal area which is clearly separated from the original wound. 03/25/17-she is here in follow-up evaluation for her left ischialpressure ulcer. She remains unsatisfied with her pressure mattress. She admits to sitting multiple hours a day, in the bed. We have discussed offloading options. The wound does not appear infected. Nutrition does not appear to be a concern. Will follow-up in 4 weeks, if wound continues to be stalled may consider x-ray to evaluate for refractory osteomyelitis. 04/21/17; this is a patient that I don't know all that well. She has a chronic wound which at one point had underlying osteomyelitis in the left ischial tuberosity. This is a stage IV pressure ulcer. Over the last 3 months she has a stage II wound inferiorly to the original wound. The last time she was here her dressing was changed to silver collagen although the patient's husband who changes the dressing said that the collagen stuck to the wound and remove skin from the superficial area therefore he switched back to [place] 05/13/17; this is a patient we've been following for a left ischial tuberosity wound which was stage IV at one point had  underlying osteomyelitis. Over the last several months she's had a stage II wound just inferior and medial to the related to the wound. According to her husband he is using Endoform layer with collagen although this is not what I had last time. According to her husband they are using Elgie Congo with collagen although I don't quite know how that started. She was hospitalized from 1/20 through 04/30/16. This was related to a UTI. Her blood cultures were negative, urine culture showed multiple species. She did have a CT scan of the abdomen and pelvis which documented chronic osteomyelitis in the area of the wound inflammatory markers were unremarkable. She has had prior knowledge of osteomyelitis. It looks as though she received IV antibiotics in 2017 and was treated with a course of hyperbaric oxygen. 05/28/17; the wound over the left ischial tuberosity is deeper today and abuts clearly on bone. Nursing intake reported drainage. I therefore culture of the wound. The more superficial area just below this looks about the same. They once again complained that there are mattress cover is not working although apparently [facility] is been noted to see this many times in the report is that the device is functional 06/18/17; the patient had a probing area on the left  ischial tuberosity that was draining purulent fluid last time. This also clearly seemed to have open bone. Culture I did showed pansensitive pseudomonas including third generation cephalosporins. I treated this with cefdinir 300 twice a day for 10 days and things seem to have improved. She has a more superficial wound just underneath this area. Amazingly she has a new air mattress through advanced home care. I think they gave this to her as a parking give. In any case this now works according to the patient may have something to do with why the areas are looking better. 07/09/17; the patient has a probing area in the left ischial tuberosity  that still has some depth. However this is contracted in terms of the wound orifice although the depth is still roughly the same. There is no undermining. ooShe also has the satellite wound which is more superficial. This appears to have a healthy surface we've been using silver collagen 08/06/17; the patient's wound is over the left ischial tuberosity and a satellite lesion just underneath this. The original wound was actually a deep stage 4 wound. We have made good progress in 2 months and there is no longer exposed bone here. 09/03/17; left ischial tuberosity actually appears to be quite healthy. I think we are making progress. No debridement is required. There is no surrounding erythema 10/01/17 I follow this patient monthly for her left ischial tuberosity wound. There is 2 areas the original area and a satellite area. The satellite area looks a lot better there is no surrounding erythema. Her husband relates that he is having trouble maintaining the dressing. This has to do with the soft tissue around it. He states he puts the collagen in but he cannot make sure that it stays in even with the ABD pads and tape that he is been using 10/29/17; patient arrives with a better looking noon today. Some of the satellite lesions have closed. using Prisma 11/26/17; the patient has a large cone-shaped area with the tip of the Cone deep within her buttock soft tissue. The walls of the Cone are epithelialized however the base is still open. The area at the base of this looks moist we've been using silver collagen. Will change to silver alginate 12/31/2017; the wound appears to have come in fairly nicely. Using silver alginate. There is no surrounding maceration or infection 01/28/18; there is still an open area here over the left initial tuberosity. Base of this however looks healthy. There is no surrounding infection 02/25/18; the area of its open is over the left ischial tuberosity. The base of this is where the  wound is. This is a large inverted cone-shaped area with the wound at the tip. Dimensions of the wound at the tip are improved. There is a area of denuded skin about halfway towards the tip which her husband thinks may have happened today when he was bathing her. 04/20/17; the area is still open over the left initial tuberosity. 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 This is now completely closed over although it is closed over with a divot and skin over bone at the base of this. Continued aggressive offloading will be necessary. 12/30/2018 READMISSION This is a 70 year old woman with chronic paraplegia. I picked her up for her care from Dr. Con Memos in this  clinic after he departed. She had a stage IV pressure wound over the left ischial tuberosity. She was treated twice for her underlying osteomyelitis and this I believe firstly in 2016 and again in 2017. There were some plans at some point for flap closure of this however she was discovered to have uncontrolled diabetes and I do not think this was ever accomplished. She ultimately healed over in this clinic and was discharged in May. She has a large cone-shaped indentation with the tip of this going towards the left ischial tuberosity. It is not an easy area to examine but at that time I thought all of this was epithelialized. Apparently there was a reopening here shortly after she left the clinic last time. She was admitted to hospital at the end of June for Klebsiella bacteremia felt to be secondary to UTI. A CT scan of the pelvis is listed below and there was initially some concern that she had underlying osteomyelitis although I believe she was seen by infectious disease and that was felt to be not the case: I do not see any new cultures or inflammatory  markers IMPRESSION: 1. No CT evidence for acute intra-abdominal or pelvic abnormality. Large volume of stool throughout the colon. 2. Enlarged fatty liver with fat sparing near the gallbladder fossa 3. Cortical scarring right kidney. Bilateral intrarenal stones without hydronephrosis. Thick-walled urinary bladder decompressed by suprapubic catheter 4. Deep left decubitus ulcer with underlying left ischial changes suggesting osteomyelitis. Her husband has been using silver collagen in the wound. She has not been systemically unwell no fever chills eating and drinking well. They rigorously offload this wound only getting up in the wheelchair when she is going to appointments the rest of the time she is in bed. 10/8; wound measures larger and she now has exposed bone. We have been using silver alginate 11/12 still using silver alginate. The patient saw Dr. Megan Salon of infectious disease. She was started on Augmentin 500 mg twice daily. She is due to follow-up with Dr. Megan Salon I believe next week. Lab work Dr. Megan Salon requested showed a sedimentation rate of 28 and CRP of 20 although her CRP 1 year ago was 18.8. Sedimentation rate 1 year ago was 11 basic metabolic panel showed a creatinine of 1.12 12/3; the patient followed up with Dr. Megan Salon yesterday. She is still on Augmentin twice daily. This was directed by Dr. Megan Salon. The patient's inflammatory markers have improved which is gratifying. Her C-reactive protein was repeated yesterday and follow-up booked with infectious disease in January. In addition I have been getting secure text messages I think from palliative care through [facility] [employer]. I think they were hoping to provide services to the patient in her home. They could not get a hold of the primary physician and so they reached out to me on 2 separate occasions. 12/17; patient last saw Dr. Megan Salon on 12/2. She is finishing up with Augmentin. Her C-reactive  protein was 20 on 10/21, 10.1 on 11/19 and 17 on 12/2. The wound itself still has depth and undermining. We are using Santyl with the backing wet-to-dry 04/27/2019. The wound is gradually clearing up in terms of the surface although it is not filled in that much. Still abuts right against bone 2/4; patient with a deep pressure ulcer over the left ischial tuberosity. I thought she was going to follow-up with infectious disease to follow her inflammatory markers although the patient states that they stated that they did not need to  see her unless we felt it was necessary. I will need to check their notes. In any case we ordered moistened silver collagen back with wet-to-dry to fill in the depth of the wound although apparently [facility] sent silver alginate which they have been using since they were here the last time. Is obviously not what we ordered. 2/25. Not much change in this wound it is over the left ischial tuberosity recurrent wound. We have been using silver collagen with backing wet-to-dry. I think the wound is about the same. There is still some tunneling from about 10-12 o'clock over the ischial tuberosity itself 3/11; pressure ulcer over the left ischial tuberosity. Since she was last here the wound VAC was started and apparently going quite well. We are able to get the home health company that accepts [place] healthcare which is in itself sometimes problematic. There is been improvements in the wound the tunneling seems to be better and is contracted nicely 4/8; 1 month follow-up. Since she was last here we have been using silver collagen under a wound VAC. Some minor contraction I think in wound volume. She is cared for diligently by her husband including pressure relief, incontinence management, nutritional support etc. 6/1; this is almost a 75-monthfollow-up. She is been using silver collagen under wound VAC. Circular area over the left ischial tuberosity. She has been using silver collagen  under wound VAC 7/8; 1 month follow-up. Silver collagen under the VAC not really a lot of progress. Tissue at the base of the wound which is right against bone and the tissue next that this does not look completely viable. She is not currently on any antibiotics, she had underlying osteomyelitis I need to look this over 8/16; we are using silver collagen under wound VAC to the left ischial tuberosity wound. Comes in today with absolutely no change in surface area or depth. There is no exposed bone. I did look over her infectious disease notes as I said I would do last time. She last saw Dr. CMegan Salonin December 2020. She completed 6 weeks of Augmentin. This was in response to a bone culture I did showing methicillin susceptible staph aureus and Enterococcus. She was supposed to come back to see Dr. CMegan Salonat some point although they say that that appointment was canceled unless I chose to recommend return. I think there was supposed to be follow-up with inflammatory markers but I cannot see that that was ever done. She has not been on antibiotics since 9/21; monthly follow-up. We received a call from home health nurse last evening to report green drainage coming out of the wound. Lab work I ordered last time showed a white count of 5.2 a sedimentation rate of 45 and a C-reactive protein of 25 however neither one of the 2 values are substantially different from her previous values in October 2020 or December 2020. Both are slightly higher but only marginally. Otherwise no new complaints from the patient or her husband 10/19; 1 month follow-up. PCR culture I did last time showed medium quantities of Pseudomonas lower quantities Klebsiella and Enterococcus faecalis group B strep and Peptostreptococcus. I gave her Augmentin for 2 weeks. I am not really sure of my choice of this I would not cover Pseudomonas. She is still having green drainage. Wound itself looks satisfactory there is not a lot of depth  wound bed looks healthy 11/16; patient has completed the antibiotics still using gentamicin and silver alginate on the wound. There is improvement in the  surface area 12/21; in general the area on the buttock looks somewhat better. Surface looks healthy although I do not know that there is been much improvement in the wound volume. We have been using silver alginate and Hydrofera Blue. Less drainage. In passing the husband showed me an abrasion injury on the left anterior tibia. Covered in necrotic surface. He has noticed this for about a week and has been putting silver collagen on it. He is completely uncertain about how this happened 1/25; monthly follow-up. The area on the left buttock is about the same. This does not go to bone but a fairly deep wound surface of the wound is of questionable viability. The abrasion injury that they showed me last time apparently was closed out by home health because they thought it was healed but certainly is not although it is just about healed. As a result they haven't been applying anything to this area Finally I did discuss with the patient and her husband the idea of an advanced treatment product to try and get a proper base to this wound I was thinking of Puraply however actually the patient points out that her co-pay for coming to visit [place] i.e. the facility, charge would be unaffordable if they have to, on a weekly basis 2/22; pressure ulcer on the left buttock appears deeper to me and abuts on the ischial tuberosity. I thought initially there was exposed bone but there is a rim of tissue over this area. She also has a superficial over the right anterior mid tibia. Been using silver collagen to these areas without much success. I have looked over the patient's past history with regards to the area on the left ischium. She did have underlying osteomyelitis here dating back I think to late 2020. She saw Dr. Megan Salon she received a 6-week course of oral  antibiotics in response to a bone culture that I did. This does not appear to be infected but it certainly has not been improving in terms of granulation. I do not believe she has had any recent imaging studies 3/29; 1 month follow-up. Pressure ulcer on the left buttock which she has been dealing with with for a number of years. She was treated for underlying osteomyelitis at 1.2 or 3 years ago I think with infectious disease help. She also had I think a flap closure by Dr. Leland Johns and that lasted for about a year and then reopened. I have not been able to get this patient to progress towards healing although truthfully the wound is absolutely no worse. We have been using Hydrofera Blue 4/26; patient presents for 1 week follow-up. She has been using silver collagen to the area every other day. She has home health that comes out once a week to help with dressing changes as well. The patient is interested in trying a skin substitute over this area. She states she is trying to relieve pressure off of it most of the day. Patient History Information obtained from Patient. Family History Unknown History, Cancer - Mother, Diabetes - Maternal Grandparents, Heart Disease - Mother, Hypertension - Mother, Thyroid Problems - Mother, No family history of Hereditary Spherocytosis, Kidney Disease, Lung Disease, Seizures, Stroke, Tuberculosis. Social History Former smoker - ended on 11/06/2014, Marital Status - Married, Alcohol Use - Never, Drug Use - No History, Caffeine Use - Daily - T soda. ea, Medical History Eyes Denies history of Cataracts, Glaucoma, Optic Neuritis Ear/Nose/Mouth/Throat Denies history of Chronic sinus problems/congestion, Middle ear problems Hematologic/Lymphatic Patient has  history of Anemia Denies history of Hemophilia, Human Immunodeficiency Virus, Lymphedema, Sickle Cell Disease Respiratory Denies history of Aspiration, Asthma, Chronic Obstructive Pulmonary Disease (COPD),  Pneumothorax, Sleep Apnea, Tuberculosis Cardiovascular Patient has history of Hypertension Denies history of Angina, Arrhythmia, Congestive Heart Failure, Coronary Artery Disease, Deep Vein Thrombosis, Hypotension, Myocardial Infarction, Peripheral Arterial Disease, Peripheral Venous Disease, Phlebitis, Vasculitis Gastrointestinal Denies history of Cirrhosis , Colitis, Crohnoos, Hepatitis A, Hepatitis B, Hepatitis C Endocrine Patient has history of Type II Diabetes Denies history of Type I Diabetes Genitourinary Denies history of End Stage Renal Disease Immunological Denies history of Lupus Erythematosus, Raynaudoos, Scleroderma Integumentary (Skin) Denies history of History of Burn Musculoskeletal Patient has history of Osteoarthritis Denies history of Gout, Rheumatoid Arthritis, Osteomyelitis Neurologic Patient has history of Dementia, Paraplegia Denies history of Neuropathy, Quadriplegia Oncologic Patient has history of Received Radiation - 2012 - right breast Psychiatric Denies history of Anorexia/bulimia, Confinement Anxiety Hospitalization/Surgery History - left knee replacement. - right breast lumpectomy. - cervical laminectomy with fusion. - hysterectomy. - tear duct surgery. - T and A. - UTI. - suprapubic cath placement. - UTI. Medical A Surgical History Notes nd Gastrointestinal constipation GERD Genitourinary UTI kidney stones neurogenic bladder Integumentary (Skin) left gluteal fold sacral Musculoskeletal cervical neuropathy osteomyelitis Psychiatric admitted for suicide risk on 12/02/2018 Objective Constitutional respirations regular, non-labored and within target range for patient.. Vitals Time Taken: 11:11 AM, Height: 63 in, Weight: 185 lbs, BMI: 32.8, Temperature: 98.6 F, Pulse: 88 bpm, Respiratory Rate: 18 breaths/min, Blood Pressure: 151/77 mmHg. Psychiatric pleasant and cooperative. General Notes: Sacrum: Left ischial tuberosity shows healthy  looking tissue at the base. There is no exposed bone or evidence of soft tissue infection. The wound looks clean. Integumentary (Hair, Skin) Wound #12 status is Open. Original cause of wound was Pressure Injury. The date acquired was: 09/06/2018. The wound has been in treatment 82 weeks. The wound is located on the Left Ischial Tuberosity. The wound measures 2.6cm length x 1cm width x 1.3cm depth; 2.042cm^2 area and 2.655cm^3 volume. There is Fat Layer (Subcutaneous Tissue) exposed. There is no tunneling or undermining noted. There is a medium amount of serosanguineous drainage noted. The wound margin is well defined and not attached to the wound base. There is large (67-100%) red, pink granulation within the wound bed. There is no necrotic tissue within the wound bed. Assessment Active Problems ICD-10 Pressure ulcer of left buttock, stage 4 Type 2 diabetes mellitus with other skin ulcer Abrasion, right lower leg, subsequent encounter Non-pressure chronic ulcer of other part of right lower leg limited to breakdown of skin Patient presents for 1 month follow-up as it is difficult for them to obtain transportation. They have been doing silver collagen every other day. The wound has healthy tissue but it is up against bone however not probing to the bone. There are no signs of infection today. At this time we will try and run [employer] [employer] to see if this is covered by her insurance. They seemed really interested in doing a skin substitute even if it meant for them to come in once weekly. Plan Follow-up Appointments: Return appointment in 1 month. Cellular or Tissue Based Products: Other Cellular or Tissue Based Products Orders/Instructions: - run IVR for [employer] Hygiene: May shower and wash wound with soap and water. - with dressingchanges Edema Control - Lymphedema / SCD / Other: Elevate legs to the level of the heart or above for 30 minutes daily and/or when  sitting, a frequency  of: Avoid standing for long periods of time. Off-Loading: Turn and reposition every 2 hours Home Health: No change in wound care orders this week; continue Home Health for wound care. May utilize formulary equivalent dressing for wound treatment orders unless otherwise specified. Other Home Health Orders/Instructions: - [facility] WOUND #12: - Ischial Tuberosity Wound Laterality: Left Cleanser: Soap and Water [facility]) Every Other Day/30 Days Discharge Instructions: May shower and wash wound with dial antibacterial soap and water prior to dressing change. Cleanser: Wound Cleanser [facility]) Every Other Day/30 Days Discharge Instructions: Cleanse the wound with wound cleanser prior to applying a clean dressing using gauze sponges, not tissue or cotton balls. Peri-Wound Care: Skin Prep [facility]) Every Other Day/30 Days Discharge Instructions: Use skin prep as directed Prim Dressing: Promogran Prisma Matrix, 4.34 (sq in) (silver collagen) (Home Health) Every Other Day/30 Days ary Discharge Instructions: Moisten collagen with saline or hydrogel. Apply moistened gauze on top of prisma. Secondary Dressing: ComfortFoam Border, 4x4 in (silicone border) [facility]) Every Other Day/30 Days Discharge Instructions: Apply over primary dressing as directed. 1. Continue silver collagen every other day 2. Follow-up in 1 month 3. skin subtitute - run by [employer]) Signed: 07/31/2020 12:22:54 PM By: [name] DO Entered By: [name] on 07/31/2020 12:21:29 -------------------------------------------------------------------------------- HxROS Details Patient Name: Date of Service: [name], [name]. 07/31/2020 11:00 A M Medical Record Number: [id number] Patient Account Number: [id number] Date of Birth/Sex: Treating RN: 01-07-1951 (70 y.o. [name] Primary Care Provider: [name] Other Clinician: Referring  Provider: Treating Provider/Extender: [name] in Treatment: 42 Information Obtained From Patient Eyes Medical History: Negative for: Cataracts; Glaucoma; Optic Neuritis Ear/Nose/Mouth/Throat Medical History: Negative for: Chronic sinus problems/congestion; Middle ear problems Hematologic/Lymphatic Medical History: Positive for: Anemia Negative for: Hemophilia; Human Immunodeficiency Virus; Lymphedema; Sickle Cell Disease Respiratory Medical History: Negative for: Aspiration; Asthma; Chronic Obstructive Pulmonary Disease (COPD); Pneumothorax; Sleep Apnea; Tuberculosis Cardiovascular Medical History: Positive for: Hypertension Negative for: Angina; Arrhythmia; Congestive Heart Failure; Coronary Artery Disease; Deep Vein Thrombosis; Hypotension; Myocardial Infarction; Peripheral Arterial Disease; Peripheral Venous Disease; Phlebitis; Vasculitis Gastrointestinal Medical History: Negative for: Cirrhosis ; Colitis; Crohns; Hepatitis A; Hepatitis B; Hepatitis C Past Medical History Notes: constipation GERD Endocrine Medical History: Positive for: Type II Diabetes Negative for: Type I Diabetes Time with diabetes: 1 year Treated with: Oral agents Blood sugar tested every day: No Genitourinary Medical History: Negative for: End Stage Renal Disease Past Medical History Notes: UTI kidney stones neurogenic bladder Immunological Medical History: Negative for: Lupus Erythematosus; Raynauds; Scleroderma Integumentary (Skin) Medical History: Negative for: History of Burn Past Medical History Notes: left gluteal fold sacral Musculoskeletal Medical History: Positive for: Osteoarthritis Negative for: Gout; Rheumatoid Arthritis; Osteomyelitis Past Medical History Notes: cervical neuropathy osteomyelitis Neurologic Medical History: Positive for: Dementia; Paraplegia Negative for: Neuropathy; Quadriplegia Oncologic Medical History: Positive for:  Received Radiation - 2012 - right breast Psychiatric Medical History: Negative for: Anorexia/bulimia; Confinement Anxiety Past Medical History Notes: admitted for suicide risk on 12/02/2018 Immunizations Pneumococcal Vaccine: Received Pneumococcal Vaccination: Yes Implantable Devices None Hospitalization / Surgery History Type of Hospitalization/Surgery left knee replacement right breast lumpectomy cervical laminectomy with fusion hysterectomy tear duct surgery Tand A UTI suprapubic cath placement UTI Family and Social History Unknown History: Yes; Cancer: Yes - Mother; Diabetes: Yes - Maternal Grandparents; Heart Disease: Yes - Mother; Hereditary Spherocytosis: No; Hypertension: Yes - Mother; Kidney Disease: No; Lung Disease: No; Seizures: No; Stroke: No; Thyroid Problems: Yes - Mother; Tuberculosis:  No; Former smoker - ended on 11/06/2014; Marital Status - Married; Alcohol Use: Never; Drug Use: No History; Caffeine Use: Daily - T soda; Financial Concerns: No; ea, Food, Clothing or Shelter Needs: No; Support System Lacking: No; Transportation Concerns: No Electronic Signature(s) Signed: 07/31/2020 12:22:54 PM By: [name] DO Signed: 07/31/2020 6:28:10 PM By: Baruch Gouty RN, BSN Entered By: [name] on 07/31/2020 12:15:38 -------------------------------------------------------------------------------- SuperBill Details Patient Name: Date of Service: [name] 07/31/2020 Medical Record Number: [id number] Patient Account Number: [id number] Date of Birth/Sex: Treating RN: Aug 18, 1950 (70 y.o. [name] Primary Care Provider: [name] Other Clinician: Referring Provider: Treating Provider/Extender: [name] in Treatment: 82 Diagnosis Coding ICD-10 Codes Code Description [phone] Pressure ulcer of left buttock, stage 4 E11.622 Type 2 diabetes mellitus with other skin ulcer S80.811D Abrasion, right lower leg,  subsequent encounter L97.811 Non-pressure chronic ulcer of other part of right lower leg limited to breakdown of skin Facility Procedures The patient participates with Medicare or their insurance follows the Medicare Facility Guidelines: CPT4 Code Description Modifier Quantity [id number] [facility] VISIT-LEV 3 EST PT 1 Electronic Signature(s) Signed: 07/31/2020 12:22:54 PM By: [name] DO Entered By: [name] on 07/31/2020 12:22:22

## 2020-08-28 ENCOUNTER — Encounter (HOSPITAL_BASED_OUTPATIENT_CLINIC_OR_DEPARTMENT_OTHER): Payer: Medicare (Managed Care) | Attending: Internal Medicine | Admitting: Internal Medicine

## 2020-08-28 ENCOUNTER — Other Ambulatory Visit: Payer: Self-pay

## 2020-08-28 DIAGNOSIS — E11622 Type 2 diabetes mellitus with other skin ulcer: Secondary | ICD-10-CM | POA: Diagnosis not present

## 2020-08-28 DIAGNOSIS — G894 Chronic pain syndrome: Secondary | ICD-10-CM | POA: Insufficient documentation

## 2020-08-28 DIAGNOSIS — E1151 Type 2 diabetes mellitus with diabetic peripheral angiopathy without gangrene: Secondary | ICD-10-CM | POA: Diagnosis not present

## 2020-08-28 DIAGNOSIS — L89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, stage 4: Secondary | ICD-10-CM | POA: Insufficient documentation

## 2020-08-28 DIAGNOSIS — Z87891 Personal history of nicotine dependence: Secondary | ICD-10-CM | POA: Insufficient documentation

## 2020-08-28 DIAGNOSIS — G8220 Paraplegia, unspecified: Secondary | ICD-10-CM | POA: Insufficient documentation

## 2020-08-28 DIAGNOSIS — L97811 Non-pressure chronic ulcer of other part of right lower leg limited to breakdown of skin: Secondary | ICD-10-CM | POA: Diagnosis not present

## 2020-08-29 NOTE — Progress Notes (Signed)
[name] ([id number]) Visit Report for 08/28/2020 Chief Complaint Document Details Patient Name: Date of Service: [name], [name] 08/28/2020 11:00 A M Medical Record Number: [id number] Patient Account Number: [id number] Date of Birth/Sex: Treating RN: June 26, 1950 (70 y.o. [name] Primary Care Provider: Sandi [name] Other Clinician: Referring Provider: Treating Provider/Extender: [name] in Treatment: 84 Information Obtained from: Patient Chief Complaint She is here for follow up evaluation of left ischial pressure ulcer 12/30/2018; patient comes back for review of wounds in the same general area over the previously healed left ischial pressure ulcer Electronic Signature(s) Signed: 08/28/2020 2:56:27 PM By: [name] Shan DO Entered By: [name] on 08/28/2020 14:51:51 -------------------------------------------------------------------------------- HPI Details Patient Name: Date of Service: [name]. 08/28/2020 11:00 A M Medical Record Number: [id number] Patient Account Number: [id number] Date of Birth/Sex: Treating RN: Aug 30, 1950 (70 y.o. [name] Primary Care Provider: Sandi [name] Other Clinician: Referring Provider: Treating Provider/Extender: [name] in Treatment: 51 History of Present Illness Location: open ulceration of the left gluteal area, left heel and right ankle for about 5 months. Quality: Patient reports No Pain. Severity: Patient states wound(s) are getting worse. Duration: Patient has had the wound for > 5 months prior to seeking treatment at the wound center Context: The wound occurred when the patient has been paraplegic for about 3 years. Modifying Factors: Wound improving due to current treatment. ssociated Signs and Symptoms: Patient reports having foul odor. A HPI Description: this 70 year old patient who is known to have hypertension, hypothyroidism, breast  cancer, chronic pain syndrome, paraplegia was noted to have a left gluteal decubitus ulcer and was brought into the hospital. During the course of her hospitalization she was debrided in the operating room by ankle wound. Bone cultures were taken at that time but were negative but clinically she was treated for osteomyelitis because of the probing down to bone and open exposed bone. Home health has been giving her antibioticss which include vancomycin and Zosyn. The patient was a smoker until about 3 weeks ago and used to smoke about 10 cigarettes a day for a long while. 12/13/2014 - details of her operative note from 11/03/2014 were reviewed -- PROCEDURE: 1. Excisional debridement skin, subcutaneous, muscle left ischium 35 cm2 2. Excisional debridement skin, subcutaneous tissue left heel 27 cm2 3. Excisional debridement right ankle skin, subcutaneous, bone 30 cm2 01/24/2015 -- she has some issues with her wheelchair cushion but other than that is doing very well and has received Podus boots for her feet. 02/14/2015 -- she was using her old offloading boots and this seemed to have caused her a new pressure ulcer on the left posterior heel near the superior part just below the Achilles tendon. 03/07/2015 -- she has a new ulceration just to the left of the midline on her sacral region more on the left buttock and this has been there for [name] and had all the wounds sharply debrided. The debridement was done for the left ischial wound, the left heel wound and the right about a week. 08/22/2015 -- was recently admitted to hospital between May 5 and 08/13/2015, with sepsis and leukocytosis due to a UTI. she was treated for a sepsis complicating Escherichia coli UTI and kidney stones. She also had metabolic and careful up at the secondary to pyelonephritis. He received broad-spectrum antibiotics initially and then received Macrobid as per urology. She was sent home on nitrofurantoin. during her  admission she had  a CT scan which showed exposed left ischial tuberosity without evidence of osteolysis. 09/12/2015-- the patient is having some issues with her air mattress and would like to get a opinion from medical modalities. 10/10/2015 -- the issue with her air mattress has not yet been sorted out and the new problem seems to be a lot of odor from the wound VAC. 11/27/2015 -- the patient was admitted to the hospital between July 23 and 10/31/2015. Her problems were sepsis, osteomyelitis of the pelvic bone and acute pyelonephritis. CT of the abdomen and pelvis was consistent with a left-sided pyelonephritis with hydronephrosis and also just showed new sclerosis of the posterior portion of the left anterior pubic ramus suggestive of periosteal reaction consistent with osteomyelitis. She was treated for the osteomyelitis with infectious disease consult recommending 6 weeks of IV antibiotics including vancomycin and Rocephin and the antibiotics were to go on until 12/10/2015. He was seen by [name] plastic surgery and Dr. Linus [name] of infectious disease. She had a suprapubic catheter placed during the admission. CT scan done on 10/28/2015 showed specifically -- New sclerosis of the posterior portion of the left inferior pubic ramus with aggressive periosteal reaction, consistent with osteomyelitis, with adjacent soft tissue gas compatible with previously described decubitus ulcer. 12/12/2015 -- she was recently seen by Dr. Linus [name], who noted good improvement and CRP and ESR compared to before and he has stopped her antibiotic as per plans to finish on September 4. The patient was encouraged to continue with wound care and consider hyperbaric oxygen therapy. Today she tells me that she has consented to undergo hyperbaric oxygen therapy and we can start the paperwork. 01/02/2016 -- her PCP had gained about 3 years but she still persists in having problems during hyperbaric oxygen therapy with some  discomfort in the ears. 01/09/16; pressure area with underlying osteomyelitis in the left buttock. Wound bed itself has some slight amount of grayish surface slough however I do not think any debridement was necessary. There is no exposed bone soft tissue appears stable. She is using a wound VAC 01/16/16; back for weekly wound review in conjunction with HBO. She has a deep wound over the left initial tuberosity previously treated with 6 weeks of IV antibiotics for osteomyelitis. Wound bed looks reasonably healthy although the base of this is still precariously close to bone. She has been using a wound VAC. 01/23/2016 -- she has completed her course of antibiotics and this week the only new thing is her right great toe nail was avulsed and she has got an open wound over the nailbed. 01/31/16 she has completed her course of antibiotics. Her right great toenail avulsed last week and she's been using silver alginate for this as well. Still using a wound VAC to the substantial stage IV wound over the left ischial tuberosity 03/05/2016 -- the patient has had a opinion from the plastic surgery group at [facility] and details of this are not available yet but the patient's verbal report has been heard by me. Did not sound like there was any optimistic discussion regarding reconstruction and the net result would be to continue with the wound VAC application. I will await the official reports. Addendum: -- she was seen at [facility] by Dr. Tressa Busman. After a thorough review and from what I understand spending 45 minutes with the patient his assessment has been noted by me in detail and the management options were: 1. Continued pressure offloading and wound care  versus operative procedures including wound excision 2. Soft tissue and bone sampling 3. If the wound gets larger wound closure would be done using a variety of plastic surgical techniques including but not  limited to skin substitute, possible skin graft, local versus regional flaps, negative pressure dressing application. 4. He discussed with her details of flap surgery and the risks associated 5. He made a comment that since the patient was operated on by [name] of [facility] plastic surgery unit in [place] the patient may continue to follow-up there for further evaluation for surgical flap closure in the future. 03/19/2016 -- the patient continues to be rather depressed and frustrated with her lack of rapid progress in healing this wound especially because she thought after hyperbaric oxygen therapy the wound would heal extremely fast. She now understands that was not the implied benefit on wound care which was the recommendation for hyperbaric oxygen therapy. I have had a lengthy discussion with the patient and her husband regarding her options: 1. Continue with collagen and wound VAC for the primary dressing and offloading and all supportive care. 2. See [name] for possible placement of Acell or Integra in the OR. 3. get a second opinion from a wound care center and surrounding regions/counties 05/07/2016 -- Note from Dr. Celedonio Miyamoto, who noted that the patient has declined flap surgery. She has discussed application of A cell, and try a few applications to see how the wound progresses. She is also recommended that we could apply products here in the wound center, like Oasis. during her preop workup it was found that her hemoglobin A1c was 11% and she has now been diagnosed as having diabetes mellitus and has been put on appropriate treatment by her PCP 05/28/2016 -- tells me her blood sugars have been doing well and she has an appointment to see her PCP in the next couple of weeks to check her hemoglobin A1c. Other than that she continues to do well. 06/25/2016 -- have not seen her back for the last month but she says her health has been about the same and she has  an appointment to check the A1c next week 09/10/16 ---- was seen by Dr. Celedonio Miyamoto -- who applied Acell and saw her back in follow-up. She has recommended silver alginate to the wound every other day and cover with foam. If no significant drainage could transition to collagen every other day. She recommended discontinuing wound VAC. There were no plans to repeat application of Acell. The patient expressed that her husband could do the wound care as going to the Wound Ctr., would cost several $100 for each visit. 10/21/2016 -- her insurance company is getting her new mattress and she is pleased about that. Other than that she has been doing dressings with PolyMem Silver and has been doing very well 02/18/2017 -- she has gone through several changes of her mattress and has not been pleased with any of them. The ventricles are still working on trying to fit her with the appropriate low air-loss mattress. She has a new wound on the gluteal area which is clearly separated from the original wound. 03/25/17-she is here in follow-up evaluation for her left ischialpressure ulcer. She remains unsatisfied with her pressure mattress. She admits to sitting multiple hours a day, in the bed. We have discussed offloading options. The wound does not appear infected. Nutrition does not appear to be a concern. Will follow-up in 4 weeks, if wound continues to be stalled may  consider x-ray to evaluate for refractory osteomyelitis. 04/21/17; this is a patient that I don't know all that well. She has a chronic wound which at one point had underlying osteomyelitis in the left ischial tuberosity. This is a stage IV pressure ulcer. Over the last 3 months she has a stage II wound inferiorly to the original wound. The last time she was here her dressing was changed to silver collagen although the patient's husband who changes the dressing said that the collagen stuck to the wound and remove skin from the superficial area  therefore he switched back to Morgan 05/13/17; this is a patient we've been following for a left ischial tuberosity wound which was stage IV at one point had underlying osteomyelitis. Over the last several months she's had a stage II wound just inferior and medial to the related to the wound. According to her husband he is using Endoform layer with collagen although this is not what I had last time. According to her husband they are using Elgie Congo with collagen although I don't quite know how that started. She was hospitalized from 1/20 through 04/30/16. This was related to a UTI. Her blood cultures were negative, urine culture showed multiple species. She did have a CT scan of the abdomen and pelvis which documented chronic osteomyelitis in the area of the wound inflammatory markers were unremarkable. She has had prior knowledge of osteomyelitis. It looks as though she received IV antibiotics in 2017 and was treated with a course of hyperbaric oxygen. 05/28/17; the wound over the left ischial tuberosity is deeper today and abuts clearly on bone. Nursing intake reported drainage. I therefore culture of the wound. The more superficial area just below this looks about the same. They once again complained that there are mattress cover is not working although apparently [facility] is been noted to see this many times in the report is that the device is functional 06/18/17; the patient had a probing area on the left ischial tuberosity that was draining purulent fluid last time. This also clearly seemed to have open bone. Culture I did showed pansensitive pseudomonas including third generation cephalosporins. I treated this with cefdinir 300 twice a day for 10 days and things seem to have improved. She has a more superficial wound just underneath this area. Amazingly she has a new air mattress through [facility]. I think they gave this to her as a parking give. In any case this now works  according to the patient may have something to do with why the areas are looking better. 07/09/17; the patient has a probing area in the left ischial tuberosity that still has some depth. However this is contracted in terms of the wound orifice although the depth is still roughly the same. There is no undermining. She also has the satellite wound which is more superficial. This appears to have a healthy surface we've been using silver collagen 08/06/17; the patient's wound is over the left ischial tuberosity and a satellite lesion just underneath this. The original wound was actually a deep stage 4 wound. We have made good progress in 2 months and there is no longer exposed bone here. 09/03/17; left ischial tuberosity actually appears to be quite healthy. I think we are making progress. No debridement is required. There is no surrounding erythema 10/01/17 I follow this patient monthly for her left ischial tuberosity wound. There is 2 areas the original area and a satellite area. The satellite area looks a lot better  there is no surrounding erythema. Her husband relates that he is having trouble maintaining the dressing. This has to do with the soft tissue around it. He states he puts the collagen in but he cannot make sure that it stays in even with the ABD pads and tape that he is been using 10/29/17; patient arrives with a better looking noon today. Some of the satellite lesions have closed. using Prisma 11/26/17; the patient has a large cone-shaped area with the tip of the Cone deep within her buttock soft tissue. The walls of the Cone are epithelialized however the base is still open. The area at the base of this looks moist we've been using silver collagen. Will change to silver alginate 12/31/2017; the wound appears to have come in fairly nicely. Using silver alginate. There is no surrounding maceration or infection 01/28/18; there is still an open area here over the left initial tuberosity. Base of  this however looks healthy. There is no surrounding infection 02/25/18; the area of its open is over the left ischial tuberosity. The base of this is where the wound is. This is a large inverted cone-shaped area with the wound at the tip. Dimensions of the wound at the tip are improved. There is a area of denuded skin about halfway towards the tip which her husband thinks may have happened today when he was bathing her. 04/20/17; the area is still open over the left initial tuberosity. 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 This is now completely closed over although it is closed over with a divot and skin over bone at the base of this. Continued aggressive offloading will be necessary. 12/30/2018 READMISSION This is a 70 year old woman with chronic paraplegia. I picked her up for her care from Dr. Con Memos in this clinic after he departed. She had a stage IV pressure wound over the left ischial tuberosity. She was treated twice for her underlying osteomyelitis and this I believe firstly in 2016 and again in 2017. There were some plans at some point for flap closure of this however she was discovered to have uncontrolled diabetes and I do not think this was ever accomplished. She ultimately healed over in this clinic and was discharged in May. She has a large cone-shaped indentation with the tip of this going towards the left ischial tuberosity. It is not an easy area to examine but at that time I thought all of this was epithelialized. Apparently there was a reopening here shortly after she left the clinic last time. She was admitted to hospital at the end of June for Klebsiella bacteremia felt to be secondary to UTI. A CT scan of the pelvis is listed below and there was initially some concern that she had  underlying osteomyelitis although I believe she was seen by infectious disease and that was felt to be not the case: I do not see any new cultures or inflammatory markers IMPRESSION: 1. No CT evidence for acute intra-abdominal or pelvic abnormality. Large volume of stool throughout the colon. 2. Enlarged fatty liver with fat sparing near the gallbladder fossa 3. Cortical scarring right kidney. Bilateral intrarenal stones without hydronephrosis. Thick-walled urinary bladder decompressed by suprapubic catheter 4. Deep left decubitus ulcer with underlying left ischial changes suggesting osteomyelitis. Her husband has been using silver collagen  in the wound. She has not been systemically unwell no fever chills eating and drinking well. They rigorously offload this wound only getting up in the wheelchair when she is going to appointments the rest of the time she is in bed. 10/8; wound measures larger and she now has exposed bone. We have been using silver alginate 11/12 still using silver alginate. The patient saw Dr. Megan Salon of infectious disease. She was started on Augmentin 500 mg twice daily. She is due to follow-up with Dr. Megan Salon I believe next week. Lab work Dr. Megan Salon requested showed a sedimentation rate of 28 and CRP of 20 although her CRP 1 year ago was 18.8. Sedimentation rate 1 year ago was 11 basic metabolic panel showed a creatinine of 1.12 12/3; the patient followed up with Dr. Megan Salon yesterday. She is still on Augmentin twice daily. This was directed by Dr. Megan Salon. The patient's inflammatory markers have improved which is gratifying. Her C-reactive protein was repeated yesterday and follow-up booked with infectious disease in January. In addition I have been getting secure text messages I think from palliative care through [facility] [employer]. I think they were hoping to provide services to the patient in her home. They could not get a hold of the primary  physician and so they reached out to me on 2 separate occasions. 12/17; patient last saw Dr. Megan Salon on 12/2. She is finishing up with Augmentin. Her C-reactive protein was 20 on 10/21, 10.1 on 11/19 and 17 on 12/2. The wound itself still has depth and undermining. We are using Santyl with the backing wet-to-dry 04/27/2019. The wound is gradually clearing up in terms of the surface although it is not filled in that much. Still abuts right against bone 2/4; patient with a deep pressure ulcer over the left ischial tuberosity. I thought she was going to follow-up with infectious disease to follow her inflammatory markers although the patient states that they stated that they did not need to see her unless we felt it was necessary. I will need to check their notes. In any case we ordered moistened silver collagen back with wet-to-dry to fill in the depth of the wound although apparently [facility] sent silver alginate which they have been using since they were here the last time. Is obviously not what we ordered. 2/25. Not much change in this wound it is over the left ischial tuberosity recurrent wound. We have been using silver collagen with backing wet-to-dry. I think the wound is about the same. There is still some tunneling from about 10-12 o'clock over the ischial tuberosity itself 3/11; pressure ulcer over the left ischial tuberosity. Since she was last here the wound VAC was started and apparently going quite well. We are able to get the home health company that accepts [place] [employer] which is in itself sometimes problematic. There is been improvements in the wound the tunneling seems to be better and is contracted nicely 4/8; 1 month follow-up. Since she was last here we have been using silver collagen under a wound VAC. Some minor contraction I think in wound volume. She is cared for diligently by her husband including pressure relief, incontinence management, nutritional support etc. 6/1; this is  almost a 39-month follow-up. She is been using silver collagen under wound VAC. Circular area over the left ischial tuberosity. She has been using silver collagen under wound VAC 7/8; 1 month follow-up. Silver collagen under the VAC not really a lot of progress. Tissue at the base  of the wound which is right against bone and the tissue next that this does not look completely viable. She is not currently on any antibiotics, she had underlying osteomyelitis I need to look this over 8/16; we are using silver collagen under wound VAC to the left ischial tuberosity wound. Comes in today with absolutely no change in surface area or depth. There is no exposed bone. I did look over her infectious disease notes as I said I would do last time. She last saw Dr. Megan Salon in December 2020. She completed 6 weeks of Augmentin. This was in response to a bone culture I did showing methicillin susceptible staph aureus and Enterococcus. She was supposed to come back to see Dr. Megan Salon at some point although they say that that appointment was canceled unless I chose to recommend return. I think there was supposed to be follow-up with inflammatory markers but I cannot see that that was ever done. She has not been on antibiotics since 9/21; monthly follow-up. We received a call from home health nurse last evening to report green drainage coming out of the wound. Lab work I ordered last time showed a white count of 5.2 a sedimentation rate of 45 and a C-reactive protein of 25 however neither one of the 2 values are substantially different from her previous values in October 2020 or December 2020. Both are slightly higher but only marginally. Otherwise no new complaints from the patient or her husband 10/19; 1 month follow-up. PCR culture I did last time showed medium quantities of Pseudomonas lower quantities Klebsiella and Enterococcus faecalis group B strep and Peptostreptococcus. I gave her Augmentin for 2 weeks. I am not  really sure of my choice of this I would not cover Pseudomonas. She is still having green drainage. Wound itself looks satisfactory there is not a lot of depth wound bed looks healthy 11/16; patient has completed the antibiotics still using gentamicin and silver alginate on the wound. There is improvement in the surface area 12/21; in general the area on the buttock looks somewhat better. Surface looks healthy although I do not know that there is been much improvement in the wound volume. We have been using silver alginate and Hydrofera Blue. Less drainage. In passing the husband showed me an abrasion injury on the left anterior tibia. Covered in necrotic surface. He has noticed this for about a week and has been putting silver collagen on it. He is completely uncertain about how this happened 1/25; monthly follow-up. The area on the left buttock is about the same. This does not go to bone but a fairly deep wound surface of the wound is of questionable viability. The abrasion injury that they showed me last time apparently was closed out by home health because they thought it was healed but certainly is not although it is just about healed. As a result they haven't been applying anything to this area Finally I did discuss with the patient and her husband the idea of an advanced treatment product to try and get a proper base to this wound I was thinking of Puraply however actually the patient points out that her co-pay for coming to visit [place] i.e. the facility, charge would be unaffordable if they have to, on a weekly basis 2/22; pressure ulcer on the left buttock appears deeper to me and abuts on the ischial tuberosity. I thought initially there was exposed bone but there is a rim of tissue over this area. She also has a superficial  over the right anterior mid tibia. Been using silver collagen to these areas without much success. I have looked over the patient's past history with regards to the area  on the left ischium. She did have underlying osteomyelitis here dating back I think to late 2020. She saw Dr. Megan Salon she received a 6-week course of oral antibiotics in response to a bone culture that I did. This does not appear to be infected but it certainly has not been improving in terms of granulation. I do not believe she has had any recent imaging studies 3/29; 1 month follow-up. Pressure ulcer on the left buttock which she has been dealing with with for a number of years. She was treated for underlying osteomyelitis at 1.2 or 3 years ago I think with infectious disease help. She also had I think a flap closure by [name] and that lasted for about a year and then reopened. I have not been able to get this patient to progress towards healing although truthfully the wound is absolutely no worse. We have been using Hydrofera Blue 4/26; patient presents for 1 week follow-up. She has been using silver collagen to the area every other day. She has home health that comes out once a week to help with dressing changes as well. The patient is interested in trying a skin substitute over this area. She states she is trying to relieve pressure off of it most of the day. 5/24; patient presents for 1 month follow-up. She has been using silver collagen to the area every other day. She has no complaints today. She is interested in the skin substitute. She tries to leave relieve pressure off Her bottom however is not able to most of the day Electronic Signature(s) Signed: 08/28/2020 2:56:27 PM By: [name] Shan DO Entered By: [name] on 08/28/2020 14:53:01 -------------------------------------------------------------------------------- Physical Exam Details Patient Name: Date of Service: [name]. 08/28/2020 11:00 A M Medical Record Number: [id number] Patient Account Number: [id number] Date of Birth/Sex: Treating RN: 06/14/1950 (70 y.o. [name] Primary Care  Provider: Sandi [name] Other Clinician: Referring Provider: Treating Provider/Extender: [name] in Treatment: 25 Constitutional respirations regular, non-labored and within target range for patient.Marland Kitchen Psychiatric pleasant and cooperative. Notes Sacrum: Left ischial tuberosity shows healthy looking tissue at the base. There is no exposed bone or evidence of soft tissue infection. The wound looks clean. Electronic Signature(s) Signed: 08/28/2020 2:56:27 PM By: [name] Shan DO Entered By: [name] on 08/28/2020 14:53:32 -------------------------------------------------------------------------------- Physician Orders Details Patient Name: Date of Service: [name], [name]. 08/28/2020 11:00 A M Medical Record Number: [id number] Patient Account Number: [id number] Date of Birth/Sex: Treating RN: [birth year] (70 y.o. [name] Primary Care Provider: Sandi [name] Other Clinician: Referring Provider: Treating Provider/Extender: [name] in Treatment: 10 Verbal / Phone Orders: No Diagnosis Coding ICD-10 Coding Code Description 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, stage 4 E11.622 Type 2 diabetes mellitus with other skin ulcer S80.811D Abrasion, right lower leg, subsequent encounter L97.811 Non-pressure chronic ulcer of other part of right lower leg limited to breakdown of skin Follow-up Appointments Return appointment in 3 weeks. Cellular or Tissue Based Products Other Cellular or Tissue Based Products Orders/Instructions: - Will discuss the oasis with Dr. Dellia Nims at next visit Bathing/ Shower/ Hygiene May shower and wash wound with soap and water. - with dressingchanges Edema Control - Lymphedema / SCD / Other Elevate legs to the level of the heart or above for  30 minutes daily and/or when sitting, a frequency of: Avoid standing for long periods of time. Off-Loading Turn and reposition every 2 hours Home Health No  change in wound care orders this week; continue Home Health for wound care. May utilize formulary equivalent dressing for wound treatment orders unless otherwise specified. Other Home Health Orders/Instructions: - [facility] Wound #12 - Ischial Tuberosity Wound Laterality: Left Cleanser: Soap and Water [facility]) Every Other Day/30 Days Discharge Instructions: May shower and wash wound with dial antibacterial soap and water prior to dressing change. Cleanser: Wound Cleanser [facility]) Every Other Day/30 Days Discharge Instructions: Cleanse the wound with wound cleanser prior to applying a clean dressing using gauze sponges, not tissue or cotton balls. Peri-Wound Care: Skin Prep [facility]) Every Other Day/30 Days Discharge Instructions: Use skin prep as directed Prim Dressing: Promogran Prisma Matrix, 4.34 (sq in) (silver collagen) (Home Health) Every Other Day/30 Days ary Discharge Instructions: Moisten collagen with saline or hydrogel. Apply moistened gauze on top of prisma. Secondary Dressing: ComfortFoam Border, 4x4 in (silicone border) [facility]) Every Other Day/30 Days Discharge Instructions: Apply over primary dressing as directed. Electronic Signature(s) Signed: 08/28/2020 2:56:27 PM By: [name] Shan DO Entered By: [name] on 08/28/2020 14:54:20 -------------------------------------------------------------------------------- Problem List Details Patient Name: Date of Service: [name]. 08/28/2020 11:00 A M Medical Record Number: [id number] Patient Account Number: [id number] Date of Birth/Sex: Treating RN: 07-25-1950 (70 y.o. [name] Primary Care Provider: Sandi [name] Other Clinician: Referring Provider: Treating Provider/Extender: [name] in Treatment: 24 Active Problems ICD-10 Encounter Code Description Active Date MDM Diagnosis 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, stage 4 12/30/2018 No  Yes E11.622 Type 2 diabetes mellitus with other skin ulcer 12/30/2018 No Yes S80.811D Abrasion, right lower leg, subsequent encounter 03/27/2020 No Yes L97.811 Non-pressure chronic ulcer of other part of right lower leg limited to breakdown 03/27/2020 No Yes of skin Inactive Problems ICD-10 Code Description Active Date Inactive Date G82.20 Paraplegia, unspecified 12/30/2018 12/30/2018 M86.68 Other chronic osteomyelitis, other site 02/17/2019 02/17/2019 Resolved Problems Electronic Signature(s) Signed: 08/28/2020 2:56:27 PM By: [name] Shan DO Entered By: [name] on 08/28/2020 14:51:33 -------------------------------------------------------------------------------- Progress Note Details Patient Name: Date of Service: [name]. 08/28/2020 11:00 A M Medical Record Number: [id number] Patient Account Number: [id number] Date of Birth/Sex: Treating RN: 1950-06-14 (70 y.o. [name] Primary Care Provider: Sandi [name] Other Clinician: Referring Provider: Treating Provider/Extender: [name] in Treatment: 53 Subjective Chief Complaint Information obtained from Patient She is here for follow up evaluation of left ischial pressure ulcer 12/30/2018; patient comes back for review of wounds in the same general area over the previously healed left ischial pressure ulcer History of Present Illness (HPI) The following HPI elements were documented for the patient's wound: Location: open ulceration of the left gluteal area, left heel and right ankle for about 5 months. Quality: Patient reports No Pain. Severity: Patient states wound(s) are getting worse. Duration: Patient has had the wound for > 5 months prior to seeking treatment at the wound center Context: The wound occurred when the patient has been paraplegic for about 3 years. Modifying Factors: Wound improving due to current treatment. Associated Signs and Symptoms: Patient reports having  foul odor. this 70 year old patient who is known to have hypertension, hypothyroidism, breast cancer, chronic pain syndrome, paraplegia was noted to have a left gluteal decubitus ulcer and was brought into the hospital. During the course of her  hospitalization she was debrided in the operating room by ankle wound. Bone cultures were taken at that time but were negative but clinically she was treated for osteomyelitis because of the probing down to bone and open exposed bone. Home health has been giving her antibioticss which include vancomycin and Zosyn. The patient was a smoker until about 3 weeks ago and used to smoke about 10 cigarettes a day for a long while. 12/13/2014 - details of her operative note from 11/03/2014 were reviewed -- PROCEDURE: 1. Excisional debridement skin, subcutaneous, muscle left ischium 35 cm2 2. Excisional debridement skin, subcutaneous tissue left heel 27 cm2 3. Excisional debridement right ankle skin, subcutaneous, bone 30 cm2 01/24/2015 -- she has some issues with her wheelchair cushion but other than that is doing very well and has received Podus boots for her feet. 02/14/2015 -- she was using her old offloading boots and this seemed to have caused her a new pressure ulcer on the left posterior heel near the superior part just below the Achilles tendon. 03/07/2015 -- she has a new ulceration just to the left of the midline on her sacral region more on the left buttock and this has been there for [name] and had all the wounds sharply debrided. The debridement was done for the left ischial wound, the left heel wound and the right about a week. 08/22/2015 -- was recently admitted to hospital between May 5 and 08/13/2015, with sepsis and leukocytosis due to a UTI. she was treated for a sepsis complicating Escherichia coli UTI and kidney stones. She also had metabolic and careful up at the secondary to pyelonephritis. He received broad-spectrum antibiotics initially  and then received Macrobid as per urology. She was sent home on nitrofurantoin. during her admission she had a CT scan which showed exposed left ischial tuberosity without evidence of osteolysis. 09/12/2015-- the patient is having some issues with her air mattress and would like to get a opinion from medical modalities. 10/10/2015 -- the issue with her air mattress has not yet been sorted out and the new problem seems to be a lot of odor from the wound VAC. 11/27/2015 -- the patient was admitted to the hospital between July 23 and 10/31/2015. Her problems were sepsis, osteomyelitis of the pelvic bone and acute pyelonephritis. CT of the abdomen and pelvis was consistent with a left-sided pyelonephritis with hydronephrosis and also just showed new sclerosis of the posterior portion of the left anterior pubic ramus suggestive of periosteal reaction consistent with osteomyelitis. She was treated for the osteomyelitis with infectious disease consult recommending 6 weeks of IV antibiotics including vancomycin and Rocephin and the antibiotics were to go on until 12/10/2015. He was seen by [name] plastic surgery and Dr. Linus [name] of infectious disease. She had a suprapubic catheter placed during the admission. CT scan done on 10/28/2015 showed specifically -- New sclerosis of the posterior portion of the left inferior pubic ramus with aggressive periosteal reaction, consistent with osteomyelitis, with adjacent soft tissue gas compatible with previously described decubitus ulcer. 12/12/2015 -- she was recently seen by Dr. Linus [name], who noted good improvement and CRP and ESR compared to before and he has stopped her antibiotic as per plans to finish on September 4. The patient was encouraged to continue with wound care and consider hyperbaric oxygen therapy. Today she tells me that she has consented to undergo hyperbaric oxygen therapy and we can start the paperwork. 01/02/2016 -- her PCP had gained about 3  years but she still persists  in having problems during hyperbaric oxygen therapy with some discomfort in the ears. 01/09/16; pressure area with underlying osteomyelitis in the left buttock. Wound bed itself has some slight amount of grayish surface slough however I do not think any debridement was necessary. There is no exposed bone soft tissue appears stable. She is using a wound VAC 01/16/16; back for weekly wound review in conjunction with HBO. She has a deep wound over the left initial tuberosity previously treated with 6 weeks of IV antibiotics for osteomyelitis. Wound bed looks reasonably healthy although the base of this is still precariously close to bone. She has been using a wound VAC. 01/23/2016 -- she has completed her course of antibiotics and this week the only new thing is her right great toe nail was avulsed and she has got an open wound over the nailbed. 01/31/16 she has completed her course of antibiotics. Her right great toenail avulsed last week and she's been using silver alginate for this as well. Still using a wound VAC to the substantial stage IV wound over the left ischial tuberosity 03/05/2016 -- the patient has had a opinion from the plastic surgery group at [facility] and details of this are not available yet but the patient's verbal report has been heard by me. Did not sound like there was any optimistic discussion regarding reconstruction and the net result would be to continue with the wound VAC application. I will await the official reports. Addendum: -- she was seen at [facility] surgery service by Dr. Tressa Busman. After a thorough review and from what I understand spending 45 minutes with the patient his assessment has been noted by me in detail and the management options were: 1. Continued pressure offloading and wound care versus operative procedures including wound excision 2. Soft tissue and bone sampling 3. If the wound gets larger wound  closure would be done using a variety of plastic surgical techniques including but not limited to skin substitute, possible skin graft, local versus regional flaps, negative pressure dressing application. 4. He discussed with her details of flap surgery and the risks associated 5. He made a comment that since the patient was operated on by [name] of [facility] plastic surgery unit in [place] the patient may continue to follow-up there for further evaluation for surgical flap closure in the future. 03/19/2016 -- the patient continues to be rather depressed and frustrated with her lack of rapid progress in healing this wound especially because she thought after hyperbaric oxygen therapy the wound would heal extremely fast. She now understands that was not the implied benefit on wound care which was the recommendation for hyperbaric oxygen therapy. I have had a lengthy discussion with the patient and her husband regarding her options: 1. Continue with collagen and wound VAC for the primary dressing and offloading and all supportive care. 2. See [name] for possible placement of Acell or Integra in the OR. 3. get a second opinion from a wound care center and surrounding regions/counties 05/07/2016 -- Note from Dr. Celedonio Miyamoto, who noted that the patient has declined flap surgery. She has discussed application of A cell, and try a few applications to see how the wound progresses. She is also recommended that we could apply products here in the wound center, like Oasis. during her preop workup it was found that her hemoglobin A1c was 11% and she has now been diagnosed as having diabetes mellitus and has been put on appropriate treatment  by her PCP 05/28/2016 -- tells me her blood sugars have been doing well and she has an appointment to see her PCP in the next couple of weeks to check her hemoglobin A1c. Other than that she continues to do well. 06/25/2016 -- have not seen  her back for the last month but she says her health has been about the same and she has an appointment to check the A1c next week 09/10/16 ---- was seen by Dr. Celedonio Miyamoto -- who applied Acell and saw her back in follow-up. She has recommended silver alginate to the wound every other day and cover with foam. If no significant drainage could transition to collagen every other day. She recommended discontinuing wound VAC. There were no plans to repeat application of Acell. The patient expressed that her husband could do the wound care as going to the Wound Ctr., would cost several $100 for each visit. 10/21/2016 -- her insurance company is getting her new mattress and she is pleased about that. Other than that she has been doing dressings with PolyMem Silver and has been doing very well 02/18/2017 -- she has gone through several changes of her mattress and has not been pleased with any of them. The ventricles are still working on trying to fit her with the appropriate low air-loss mattress. She has a new wound on the gluteal area which is clearly separated from the original wound. 03/25/17-she is here in follow-up evaluation for her left ischialpressure ulcer. She remains unsatisfied with her pressure mattress. She admits to sitting multiple hours a day, in the bed. We have discussed offloading options. The wound does not appear infected. Nutrition does not appear to be a concern. Will follow-up in 4 weeks, if wound continues to be stalled may consider x-ray to evaluate for refractory osteomyelitis. 04/21/17; this is a patient that I don't know all that well. She has a chronic wound which at one point had underlying osteomyelitis in the left ischial tuberosity. This is a stage IV pressure ulcer. Over the last 3 months she has a stage II wound inferiorly to the original wound. The last time she was here her dressing was changed to silver collagen although the patient's husband who changes the dressing  said that the collagen stuck to the wound and remove skin from the superficial area therefore he switched back to [place] 05/13/17; this is a patient we've been following for a left ischial tuberosity wound which was stage IV at one point had underlying osteomyelitis. Over the last several months she's had a stage II wound just inferior and medial to the related to the wound. According to her husband he is using Endoform layer with collagen although this is not what I had last time. According to her husband they are using Elgie [place] with collagen although I don't quite know how that started. She was hospitalized from 1/20 through 04/30/16. This was related to a UTI. Her blood cultures were negative, urine culture showed multiple species. She did have a CT scan of the abdomen and pelvis which documented chronic osteomyelitis in the area of the wound inflammatory markers were unremarkable. She has had prior knowledge of osteomyelitis. It looks as though she received IV antibiotics in 2017 and was treated with a course of hyperbaric oxygen. 05/28/17; the wound over the left ischial tuberosity is deeper today and abuts clearly on bone. Nursing intake reported drainage. I therefore culture of the wound. The more superficial area just below this looks about the  same. They once again complained that there are mattress cover is not working although apparently [facility] is been noted to see this many times in the report is that the device is functional 06/18/17; the patient had a probing area on the left ischial tuberosity that was draining purulent fluid last time. This also clearly seemed to have open bone. Culture I did showed pansensitive pseudomonas including third generation cephalosporins. I treated this with cefdinir 300 twice a day for 10 days and things seem to have improved. She has a more superficial wound just underneath this area. Amazingly she has a new air mattress through advanced  home care. I think they gave this to her as a parking give. In any case this now works according to the patient may have something to do with why the areas are looking better. 07/09/17; the patient has a probing area in the left ischial tuberosity that still has some depth. However this is contracted in terms of the wound orifice although the depth is still roughly the same. There is no undermining. ooShe also has the satellite wound which is more superficial. This appears to have a healthy surface we've been using silver collagen 08/06/17; the patient's wound is over the left ischial tuberosity and a satellite lesion just underneath this. The original wound was actually a deep stage 4 wound. We have made good progress in 2 months and there is no longer exposed bone here. 09/03/17; left ischial tuberosity actually appears to be quite healthy. I think we are making progress. No debridement is required. There is no surrounding erythema 10/01/17 I follow this patient monthly for her left ischial tuberosity wound. There is 2 areas the original area and a satellite area. The satellite area looks a lot better there is no surrounding erythema. Her husband relates that he is having trouble maintaining the dressing. This has to do with the soft tissue around it. He states he puts the collagen in but he cannot make sure that it stays in even with the ABD pads and tape that he is been using 10/29/17; patient arrives with a better looking noon today. Some of the satellite lesions have closed. using Prisma 11/26/17; the patient has a large cone-shaped area with the tip of the Cone deep within her buttock soft tissue. The walls of the Cone are epithelialized however the base is still open. The area at the base of this looks moist we've been using silver collagen. Will change to silver alginate 12/31/2017; the wound appears to have come in fairly nicely. Using silver alginate. There is no surrounding maceration or  infection 01/28/18; there is still an open area here over the left initial tuberosity. Base of this however looks healthy. There is no surrounding infection 02/25/18; the area of its open is over the left ischial tuberosity. The base of this is where the wound is. This is a large inverted cone-shaped area with the wound at the tip. Dimensions of the wound at the tip are improved. There is a area of denuded skin about halfway towards the tip which her husband thinks may have happened today when he was bathing her. 04/20/17; the area is still open over the left initial tuberosity. 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 This is now completely closed over although  it is closed over with a divot and skin over bone at the base of this. Continued aggressive offloading will be necessary. 12/30/2018 READMISSION This is a 70 year old woman with chronic paraplegia. I picked her up for her care from Dr. Con Memos in this clinic after he departed. She had a stage IV pressure wound over the left ischial tuberosity. She was treated twice for her underlying osteomyelitis and this I believe firstly in 2016 and again in 2017. There were some plans at some point for flap closure of this however she was discovered to have uncontrolled diabetes and I do not think this was ever accomplished. She ultimately healed over in this clinic and was discharged in May. She has a large cone-shaped indentation with the tip of this going towards the left ischial tuberosity. It is not an easy area to examine but at that time I thought all of this was epithelialized. Apparently there was a reopening here shortly after she left the clinic last time. She was admitted to hospital at the end of June for Klebsiella bacteremia felt to be secondary to  UTI. A CT scan of the pelvis is listed below and there was initially some concern that she had underlying osteomyelitis although I believe she was seen by infectious disease and that was felt to be not the case: I do not see any new cultures or inflammatory markers IMPRESSION: 1. No CT evidence for acute intra-abdominal or pelvic abnormality. Large volume of stool throughout the colon. 2. Enlarged fatty liver with fat sparing near the gallbladder fossa 3. Cortical scarring right kidney. Bilateral intrarenal stones without hydronephrosis. Thick-walled urinary bladder decompressed by suprapubic catheter 4. Deep left decubitus ulcer with underlying left ischial changes suggesting osteomyelitis. Her husband has been using silver collagen in the wound. She has not been systemically unwell no fever chills eating and drinking well. They rigorously offload this wound only getting up in the wheelchair when she is going to appointments the rest of the time she is in bed. 10/8; wound measures larger and she now has exposed bone. We have been using silver alginate 11/12 still using silver alginate. The patient saw Dr. Megan Salon of infectious disease. She was started on Augmentin 500 mg twice daily. She is due to follow-up with Dr. Megan Salon I believe next week. Lab work Dr. Megan Salon requested showed a sedimentation rate of 28 and CRP of 20 although her CRP 1 year ago was 18.8. Sedimentation rate 1 year ago was 11 basic metabolic panel showed a creatinine of 1.12 12/3; the patient followed up with Dr. Megan Salon yesterday. She is still on Augmentin twice daily. This was directed by Dr. Megan Salon. The patient's inflammatory markers have improved which is gratifying. Her C-reactive protein was repeated yesterday and follow-up booked with infectious disease in January. In addition I have been getting secure text messages I think from palliative care through [facility] [employer]. I think they were  hoping to provide services to the patient in her home. They could not get a hold of the primary physician and so they reached out to me on 2 separate occasions. 12/17; patient last saw Dr. Megan Salon on 12/2. She is finishing up with Augmentin. Her C-reactive protein was 20 on 10/21, 10.1 on 11/19 and 17 on 12/2. The wound itself still has depth and undermining. We are using Santyl with the backing wet-to-dry 04/27/2019. The wound is gradually clearing up in terms of the surface although it is not filled in that much. Still  abuts right against bone 2/4; patient with a deep pressure ulcer over the left ischial tuberosity. I thought she was going to follow-up with infectious disease to follow her inflammatory markers although the patient states that they stated that they did not need to see her unless we felt it was necessary. I will need to check their notes. In any case we ordered moistened silver collagen back with wet-to-dry to fill in the depth of the wound although apparently [facility] sent silver alginate which they have been using since they were here the last time. Is obviously not what we ordered. 2/25. Not much change in this wound it is over the left ischial tuberosity recurrent wound. We have been using silver collagen with backing wet-to-dry. I think the wound is about the same. There is still some tunneling from about 10-12 o'clock over the ischial tuberosity itself 3/11; pressure ulcer over the left ischial tuberosity. Since she was last here the wound VAC was started and apparently going quite well. We are able to get the home health company that accepts [place] healthcare which is in itself sometimes problematic. There is been improvements in the wound the tunneling seems to be better and is contracted nicely 4/8; 1 month follow-up. Since she was last here we have been using silver collagen under a wound VAC. Some minor contraction I think in wound volume. She is cared for diligently by her  husband including pressure relief, incontinence management, nutritional support etc. 6/1; this is almost a 33-month follow-up. She is been using silver collagen under wound VAC. Circular area over the left ischial tuberosity. She has been using silver collagen under wound VAC 7/8; 1 month follow-up. Silver collagen under the VAC not really a lot of progress. Tissue at the base of the wound which is right against bone and the tissue next that this does not look completely viable. She is not currently on any antibiotics, she had underlying osteomyelitis I need to look this over 8/16; we are using silver collagen under wound VAC to the left ischial tuberosity wound. Comes in today with absolutely no change in surface area or depth. There is no exposed bone. I did look over her infectious disease notes as I said I would do last time. She last saw Dr. Megan Salon in December 2020. She completed 6 weeks of Augmentin. This was in response to a bone culture I did showing methicillin susceptible staph aureus and Enterococcus. She was supposed to come back to see Dr. Megan Salon at some point although they say that that appointment was canceled unless I chose to recommend return. I think there was supposed to be follow-up with inflammatory markers but I cannot see that that was ever done. She has not been on antibiotics since 9/21; monthly follow-up. We received a call from home health nurse last evening to report green drainage coming out of the wound. Lab work I ordered last time showed a white count of 5.2 a sedimentation rate of 45 and a C-reactive protein of 25 however neither one of the 2 values are substantially different from her previous values in October 2020 or December 2020. Both are slightly higher but only marginally. Otherwise no new complaints from the patient or her husband 10/19; 1 month follow-up. PCR culture I did last time showed medium quantities of Pseudomonas lower quantities Klebsiella and  Enterococcus faecalis group B strep and Peptostreptococcus. I gave her Augmentin for 2 weeks. I am not really sure of my choice of this I would  not cover Pseudomonas. She is still having green drainage. Wound itself looks satisfactory there is not a lot of depth wound bed looks healthy 11/16; patient has completed the antibiotics still using gentamicin and silver alginate on the wound. There is improvement in the surface area 12/21; in general the area on the buttock looks somewhat better. Surface looks healthy although I do not know that there is been much improvement in the wound volume. We have been using silver alginate and Hydrofera Blue. Less drainage. In passing the husband showed me an abrasion injury on the left anterior tibia. Covered in necrotic surface. He has noticed this for about a week and has been putting silver collagen on it. He is completely uncertain about how this happened 1/25; monthly follow-up. The area on the left buttock is about the same. This does not go to bone but a fairly deep wound surface of the wound is of questionable viability. The abrasion injury that they showed me last time apparently was closed out by home health because they thought it was healed but certainly is not although it is just about healed. As a result they haven't been applying anything to this area Finally I did discuss with the patient and her husband the idea of an advanced treatment product to try and get a proper base to this wound I was thinking of Puraply however actually the patient points out that her co-pay for coming to visit [place] i.e. the facility, charge would be unaffordable if they have to, on a weekly basis 2/22; pressure ulcer on the left buttock appears deeper to me and abuts on the ischial tuberosity. I thought initially there was exposed bone but there is a rim of tissue over this area. She also has a superficial over the right anterior mid tibia. Been using silver collagen to  these areas without much success. I have looked over the patient's past history with regards to the area on the left ischium. She did have underlying osteomyelitis here dating back I think to late 2020. She saw Dr. Megan Salon she received a 6-week course of oral antibiotics in response to a bone culture that I did. This does not appear to be infected but it certainly has not been improving in terms of granulation. I do not believe she has had any recent imaging studies 3/29; 1 month follow-up. Pressure ulcer on the left buttock which she has been dealing with with for a number of years. She was treated for underlying osteomyelitis at 1.2 or 3 years ago I think with infectious disease help. She also had I think a flap closure by [name] and that lasted for about a year and then reopened. I have not been able to get this patient to progress towards healing although truthfully the wound is absolutely no worse. We have been using Hydrofera Blue 4/26; patient presents for 1 week follow-up. She has been using silver collagen to the area every other day. She has home health that comes out once a week to help with dressing changes as well. The patient is interested in trying a skin substitute over this area. She states she is trying to relieve pressure off of it most of the day. 5/24; patient presents for 1 month follow-up. She has been using silver collagen to the area every other day. She has no complaints today. She is interested in the skin substitute. She tries to leave relieve pressure off Her bottom however is not able to most of the day  Patient History Information obtained from Patient. Family History Unknown History, Cancer - Mother, Diabetes - Maternal Grandparents, Heart Disease - Mother, Hypertension - Mother, Thyroid Problems - Mother, No family history of Hereditary Spherocytosis, Kidney Disease, Lung Disease, Seizures, Stroke, Tuberculosis. Social History Former smoker - ended on  11/06/2014, Marital Status - Married, Alcohol Use - Never, Drug Use - No History, Caffeine Use - Daily - T soda. ea, Medical History Eyes Denies history of Cataracts, Glaucoma, Optic Neuritis Ear/Nose/Mouth/Throat Denies history of Chronic sinus problems/congestion, Middle ear problems Hematologic/Lymphatic Patient has history of Anemia Denies history of Hemophilia, Human Immunodeficiency Virus, Lymphedema, Sickle Cell Disease Respiratory Denies history of Aspiration, Asthma, Chronic Obstructive Pulmonary Disease (COPD), Pneumothorax, Sleep Apnea, Tuberculosis Cardiovascular Patient has history of Hypertension Denies history of Angina, Arrhythmia, Congestive Heart Failure, Coronary Artery Disease, Deep Vein Thrombosis, Hypotension, Myocardial Infarction, Peripheral Arterial Disease, Peripheral Venous Disease, Phlebitis, Vasculitis Gastrointestinal Denies history of Cirrhosis , Colitis, Crohnoos, Hepatitis A, Hepatitis B, Hepatitis C Endocrine Patient has history of Type II Diabetes Denies history of Type I Diabetes Genitourinary Denies history of End Stage Renal Disease Immunological Denies history of Lupus Erythematosus, Raynaudoos, Scleroderma Integumentary (Skin) Denies history of History of Burn Musculoskeletal Patient has history of Osteoarthritis Denies history of Gout, Rheumatoid Arthritis, Osteomyelitis Neurologic Patient has history of Dementia, Paraplegia Denies history of Neuropathy, Quadriplegia Oncologic Patient has history of Received Radiation - 2012 - right breast Psychiatric Denies history of Anorexia/bulimia, Confinement Anxiety Hospitalization/Surgery History - left knee replacement. - right breast lumpectomy. - cervical laminectomy with fusion. - hysterectomy. - tear duct surgery. - T and A. - UTI. - suprapubic cath placement. - UTI. Medical A Surgical History Notes nd Gastrointestinal constipation GERD Genitourinary UTI kidney stones neurogenic  bladder Integumentary (Skin) left gluteal fold sacral Musculoskeletal cervical neuropathy osteomyelitis Psychiatric admitted for suicide risk on 12/02/2018 Objective Constitutional respirations regular, non-labored and within target range for patient.. Vitals Time Taken: 11:51 AM, Height: 63 in, Source: Stated, Weight: 185 lbs, Source: Stated, BMI: 32.8, Temperature: 97.8 F, Pulse: 94 bpm, Respiratory Rate: 18 breaths/min, Blood Pressure: 156/83 mmHg. Psychiatric pleasant and cooperative. General Notes: Sacrum: Left ischial tuberosity shows healthy looking tissue at the base. There is no exposed bone or evidence of soft tissue infection. The wound looks clean. Integumentary (Hair, Skin) Wound #12 status is Open. Original cause of wound was Pressure Injury. The date acquired was: 09/06/2018. The wound has been in treatment 86 weeks. The wound is located on the Left Ischial Tuberosity. The wound measures 2.5cm length x 0.6cm width x 1.1cm depth; 1.178cm^2 area and 1.296cm^3 volume. There is Fat Layer (Subcutaneous Tissue) exposed. There is no undermining noted, however, there is tunneling at 12:00 with a maximum distance of 0.9cm. There is additional tunneling and at 6:00 with a maximum distance of 0.8cm. There is a medium amount of serosanguineous drainage noted. The wound margin is well defined and not attached to the wound base. There is large (67-100%) red, pink granulation within the wound bed. There is no necrotic tissue within the wound bed. Assessment Active Problems ICD-10 Pressure ulcer of left buttock, stage 4 Type 2 diabetes mellitus with other skin ulcer Abrasion, right lower leg, subsequent encounter Non-pressure chronic ulcer of other part of right lower leg limited to breakdown of skin Overall wound is stable. There are no signs of infection. We will continue with collagen. We had a long discussion about aggressive offloading to the area. The skin substitute was approved  for [facility] however there is a  large out-of-pocket cost for facility fee and deductible. Patient and husband will think about it and rediscuss at next clinic visit. I would like for her to follow-up with Dr. Dellia Nims as this is her patient however due to cost they cannot come to the clinic next week. They would like to come in 3 weeks. Plan Follow-up Appointments: Return appointment in 3 weeks. Cellular or Tissue Based Products: Other Cellular or Tissue Based Products Orders/Instructions: - Will discuss the [facility] with Dr. Dellia Nims at next visit Bathing/ Shower/ Hygiene: May shower and wash wound with soap and water. - with dressingchanges Edema Control - Lymphedema / SCD / Other: Elevate legs to the level of the heart or above for 30 minutes daily and/or when sitting, a frequency of: Avoid standing for long periods of time. Off-Loading: Turn and reposition every 2 hours Home Health: No change in wound care orders this week; continue Home Health for wound care. May utilize formulary equivalent dressing for wound treatment orders unless otherwise specified. Other Home Health Orders/Instructions: - [facility] WOUND #12: - Ischial Tuberosity Wound Laterality: Left Cleanser: Soap and Water [facility]) Every Other Day/30 Days Discharge Instructions: May shower and wash wound with dial antibacterial soap and water prior to dressing change. Cleanser: Wound Cleanser [facility]) Every Other Day/30 Days Discharge Instructions: Cleanse the wound with wound cleanser prior to applying a clean dressing using gauze sponges, not tissue or cotton balls. Peri-Wound Care: Skin Prep [facility]) Every Other Day/30 Days Discharge Instructions: Use skin prep as directed Prim Dressing: Promogran Prisma Matrix, 4.34 (sq in) (silver collagen) (Home Health) Every Other Day/30 Days ary Discharge Instructions: Moisten collagen with saline or hydrogel. Apply moistened gauze on top of prisma. Secondary Dressing:  ComfortFoam Border, 4x4 in (silicone border) [facility]) Every Other Day/30 Days Discharge Instructions: Apply over primary dressing as directed. 1. Collagen 2. Follow-up with Dr. Dellia Nims in 3 weeks Electronic Signature(s) Signed: 08/28/2020 2:56:27 PM By: [name] Shan DO Entered By: [name] on 08/28/2020 14:55:50 -------------------------------------------------------------------------------- HxROS Details Patient Name: Date of Service: [name], [name]. 08/28/2020 11:00 A M Medical Record Number: [id number] Patient Account Number: [id number] Date of Birth/Sex: Treating RN: 04-Oct-1950 (71 y.o. [name] Primary Care Provider: Sandi [name] Other Clinician: Referring Provider: Treating Provider/Extender: [name] in Treatment: 65 Information Obtained From Patient Eyes Medical History: Negative for: Cataracts; Glaucoma; Optic Neuritis Ear/Nose/Mouth/Throat Medical History: Negative for: Chronic sinus problems/congestion; Middle ear problems Hematologic/Lymphatic Medical History: Positive for: Anemia Negative for: Hemophilia; Human Immunodeficiency Virus; Lymphedema; Sickle Cell Disease Respiratory Medical History: Negative for: Aspiration; Asthma; Chronic Obstructive Pulmonary Disease (COPD); Pneumothorax; Sleep Apnea; Tuberculosis Cardiovascular Medical History: Positive for: Hypertension Negative for: Angina; Arrhythmia; Congestive Heart Failure; Coronary Artery Disease; Deep Vein Thrombosis; Hypotension; Myocardial Infarction; Peripheral Arterial Disease; Peripheral Venous Disease; Phlebitis; Vasculitis Gastrointestinal Medical History: Negative for: Cirrhosis ; Colitis; Crohns; Hepatitis A; Hepatitis B; Hepatitis C Past Medical History Notes: constipation GERD Endocrine Medical History: Positive for: Type II Diabetes Negative for: Type I Diabetes Time with diabetes: 1 year Treated with: Oral agents Blood sugar tested  every day: No Genitourinary Medical History: Negative for: End Stage Renal Disease Past Medical History Notes: UTI kidney stones neurogenic bladder Immunological Medical History: Negative for: Lupus Erythematosus; Raynauds; Scleroderma Integumentary (Skin) Medical History: Negative for: History of Burn Past Medical History Notes: left gluteal fold sacral Musculoskeletal Medical History: Positive for: Osteoarthritis Negative for: Gout; Rheumatoid Arthritis; Osteomyelitis Past Medical History Notes: cervical neuropathy osteomyelitis Neurologic Medical History:  Positive for: Dementia; Paraplegia Negative for: Neuropathy; Quadriplegia Oncologic Medical History: Positive for: Received Radiation - 2012 - right breast Psychiatric Medical History: Negative for: Anorexia/bulimia; Confinement Anxiety Past Medical History Notes: admitted for suicide risk on 12/02/2018 Immunizations Pneumococcal Vaccine: Received Pneumococcal Vaccination: Yes Implantable Devices None Hospitalization / Surgery History Type of Hospitalization/Surgery left knee replacement right breast lumpectomy cervical laminectomy with fusion hysterectomy tear duct surgery Tand A UTI suprapubic cath placement UTI Family and Social History Unknown History: Yes; Cancer: Yes - Mother; Diabetes: Yes - Maternal Grandparents; Heart Disease: Yes - Mother; Hereditary Spherocytosis: No; Hypertension: Yes - Mother; Kidney Disease: No; Lung Disease: No; Seizures: No; Stroke: No; Thyroid Problems: Yes - Mother; Tuberculosis: No; Former smoker - ended on 11/06/2014; Marital Status - Married; Alcohol Use: Never; Drug Use: No History; Caffeine Use: Daily - T soda; Financial Concerns: No; ea, Food, Clothing or Shelter Needs: No; Support System Lacking: No; Transportation Concerns: No Electronic Signature(s) Signed: 08/28/2020 2:56:27 PM By: [name] Shan DO Signed: 08/29/2020 4:26:30 PM By: Rhae Hammock RN Entered  By: [name] on 08/28/2020 14:53:10 -------------------------------------------------------------------------------- SuperBill Details Patient Name: Date of Service: [name] 08/28/2020 Medical Record Number: [id number] Patient Account Number: [id number] Date of Birth/Sex: Treating RN: Feb 18, 1951 (70 y.o. [name] Primary Care Provider: Sandi [name] Other Clinician: Referring Provider: Treating Provider/Extender: [name] in Treatment: 84 Diagnosis Coding ICD-10 Codes Code Description [phone] Pressure ulcer of left buttock, stage 4 E11.622 Type 2 diabetes mellitus with other skin ulcer S80.811D Abrasion, right lower leg, subsequent encounter L97.811 Non-pressure chronic ulcer of other part of right lower leg limited to breakdown of skin Facility Procedures The patient participates with Medicare or their insurance follows the Medicare Facility Guidelines: CPT4 Code Description Modifier Quantity [id number] Robert Lee VISIT-LEV 3 EST PT 1 Physician Procedures : CPT4 Code Description Modifier [id number] - WC PHYS LEVEL 3 - EST PT ICD-10 Diagnosis Description 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, stage 4 E11.622 Type 2 diabetes mellitus with other skin ulcer Quantity: 1 Electronic Signature(s) Signed: 08/28/2020 2:56:27 PM By: [name] Shan DO Entered By: [name] on 08/28/2020 14:56:05

## 2020-08-29 NOTE — Progress Notes (Signed)
[name], [name] ([id number]) Visit Report for 08/28/2020 Arrival Information Details Patient Name: Date of Service: [name], [name] 08/28/2020 11:00 A M Medical Record Number: [id number] Patient Account Number: [id number] Date of Birth/Sex: Treating RN: 1950/07/12 (70 y.o. [name] Primary Care Oveta Idris: [name] Other Clinician: Referring Ianna Salmela: Treating Kerman Pfost/Extender: [name] in Treatment: 31 Visit Information History Since Last Visit Added or deleted any medications: No Patient Arrived: Wheel Chair Any new allergies or adverse reactions: No Arrival Time: 11:50 Had a fall or experienced change in No Accompanied By: spouse activities of daily living that may affect Transfer Assistance: Manual risk of falls: Patient Identification Verified: Yes Signs or symptoms of abuse/neglect since last visito No Secondary Verification Process Completed: Yes Hospitalized since last visit: No Patient Requires Transmission-Based Precautions: No Implantable device outside of the clinic excluding No Patient Has Alerts: No cellular tissue based products placed in the center since last visit: Has Dressing in Place as Prescribed: Yes Pain Present Now: Yes Electronic Signature(s) Signed: 08/28/2020 5:08:08 PM By: Baruch Gouty RN, BSN Entered By: Baruch Gouty on 08/28/2020 11:51:26 -------------------------------------------------------------------------------- Clinic Level of Care Assessment Details Patient Name: Date of Service: [name], [name] 08/28/2020 11:00 A M Medical Record Number: [id number] Patient Account Number: [id number] Date of Birth/Sex: Treating RN: 1950-06-24 (70 y.o. [name], [name] Primary Care Johnel Yielding: [name] Other Clinician: Referring Cyrene Gharibian: Treating Rozetta Stumpp/Extender: [name] in Treatment: 44 Clinic Level of Care Assessment Items TOOL 4 Quantity Score X- 1 0 Use when only an  EandM is performed on FOLLOW-UP visit ASSESSMENTS - Nursing Assessment / Reassessment X- 1 10 Reassessment of Co-morbidities (includes updates in patient status) X- 1 5 Reassessment of Adherence to Treatment Plan ASSESSMENTS - Wound and Skin A ssessment / Reassessment X - Simple Wound Assessment / Reassessment - one wound 1 5 []  - 0 Complex Wound Assessment / Reassessment - multiple wounds X- 1 10 Dermatologic / Skin Assessment (not related to wound area) ASSESSMENTS - Focused Assessment []  - 0 Circumferential Edema Measurements - multi extremities []  - 0 Nutritional Assessment / Counseling / Intervention []  - 0 Lower Extremity Assessment (monofilament, tuning fork, pulses) []  - 0 Peripheral Arterial Disease Assessment (using hand held doppler) ASSESSMENTS - Ostomy and/or Continence Assessment and Care []  - 0 Incontinence Assessment and Management []  - 0 Ostomy Care Assessment and Management (repouching, etc.) PROCESS - Coordination of Care X - Simple Patient / Family Education for ongoing care 1 15 []  - 0 Complex (extensive) Patient / Family Education for ongoing care X- 1 10 Staff obtains Programmer, systems, Records, T Results / Process Orders est []  - 0 Staff telephones HHA, Nursing Homes / Clarify orders / etc []  - 0 Routine Transfer to another Facility (non-emergent condition) []  - 0 Routine Hospital Admission (non-emergent condition) []  - 0 New Admissions / Biomedical engineer / Ordering NPWT Apligraf, etc. , []  - 0 Emergency Hospital Admission (emergent condition) X- 1 10 Simple Discharge Coordination []  - 0 Complex (extensive) Discharge Coordination PROCESS - Special Needs []  - 0 Pediatric / Minor Patient Management []  - 0 Isolation Patient Management []  - 0 Hearing / Language / Visual special needs []  - 0 Assessment of Community assistance (transportation, D/C planning, etc.) []  - 0 Additional assistance / Altered mentation []  - 0 Support Surface(s)  Assessment (bed, cushion, seat, etc.) INTERVENTIONS - Wound Cleansing / Measurement X - Simple Wound Cleansing - one wound 1 5 []  - 0 Complex  Wound Cleansing - multiple wounds X- 1 5 Wound Imaging (photographs - any number of wounds) []  - 0 Wound Tracing (instead of photographs) X- 1 5 Simple Wound Measurement - one wound []  - 0 Complex Wound Measurement - multiple wounds INTERVENTIONS - Wound Dressings []  - 0 Small Wound Dressing one or multiple wounds X- 1 15 Medium Wound Dressing one or multiple wounds []  - 0 Large Wound Dressing one or multiple wounds []  - 0 Application of Medications - topical []  - 0 Application of Medications - injection INTERVENTIONS - Miscellaneous []  - 0 External ear exam []  - 0 Specimen Collection (cultures, biopsies, blood, body fluids, etc.) []  - 0 Specimen(s) / Culture(s) sent or taken to Lab for analysis []  - 0 Patient Transfer (multiple staff / Civil Service fast streamer / Similar devices) []  - 0 Simple Staple / Suture removal (25 or less) []  - 0 Complex Staple / Suture removal (26 or more) []  - 0 Hypo / Hyperglycemic Management (close monitor of Blood Glucose) []  - 0 Ankle / Brachial Index (ABI) - do not check if billed separately X- 1 5 Vital Signs Has the patient been seen at the hospital within the last three years: Yes Total Score: 100 Level Of Care: New/Established - Level 3 Electronic Signature(s) Signed: 08/29/2020 4:26:30 PM By: Rhae Hammock RN Entered By: Rhae Hammock on 08/28/2020 12:49:21 -------------------------------------------------------------------------------- Encounter Discharge Information Details Patient Name: Date of Service: [name] Pulse M. 08/28/2020 11:00 A M Medical Record Number: [id number] Patient Account Number: [id number] Date of Birth/Sex: Treating RN: 12-22-1950 (71 y.o. Sue Lush Primary Care Domenik Trice: [name] Other Clinician: Referring Haik Mahoney: Treating Knoxx Boeding/Extender: [name] in Treatment: 55 Encounter Discharge Information Items Discharge Condition: Stable Ambulatory Status: Wheelchair Discharge Destination: Home Transportation: Private Auto Accompanied ByRaechel Chute Schedule Follow-up Appointment: Yes Clinical Summary of Care: Provided on 08/28/2020 Form Type Recipient Paper Patient Patient Electronic Signature(s) Signed: 08/28/2020 1:07:43 PM By: Lorrin Jackson Entered By: Lorrin Jackson on 08/28/2020 13:07:43 -------------------------------------------------------------------------------- Lower Extremity Assessment Details Patient Name: Date of Service: [name]. 08/28/2020 11:00 A M Medical Record Number: [id number] Patient Account Number: [id number] Date of Birth/Sex: Treating RN: 09-15-1950 (70 y.o. [name] Primary Care Jerrol Helmers: [name] Other Clinician: Referring Trecia Maring: Treating Tayshon Winker/Extender: [name] in Treatment: 2 Electronic Signature(s) Signed: 08/28/2020 5:08:08 PM By: Baruch Gouty RN, BSN Entered By: Baruch Gouty on 08/28/2020 11:52:22 -------------------------------------------------------------------------------- Multi Wound Chart Details Patient Name: Date of Service: [name], [name]. 08/28/2020 11:00 A M Medical Record Number: [id number] Patient Account Number: [id number] Date of Birth/Sex: Treating RN: June 19, 1950 (70 y.o. [name], [name] Primary Care Binyomin Brann: [name] Other Clinician: Referring Nakeem Murnane: Treating Blade Scheff/Extender: [name] in Treatment: 61 Vital Signs Height(in): 63 Pulse(bpm): 94 Weight(lbs): 185 Blood Pressure(mmHg): 156/83 Body Mass Index(BMI): 33 Temperature(F): 97.8 Respiratory Rate(breaths/min): 18 Photos: [12:No Photos Left Ischial Tuberosity] [N/A:N/A N/A] Wound Location: [12:Pressure Injury] [N/A:N/A] Wounding Event: [12:Pressure Ulcer] [N/A:N/A] Primary Etiology: [12:Anemia,  Hypertension, Type II] [N/A:N/A] Comorbid History: [12:Diabetes, Osteoarthritis, Dementia, Paraplegia, Received Radiation 09/06/2018] [N/A:N/A] Date Acquired: [12:86] [N/A:N/A] Weeks of Treatment: [12:Open] [N/A:N/A] Wound Status: [12:2.5x0.6x1.1] [N/A:N/A] Measurements L x W x D (cm) [12:1.178] [N/A:N/A] A (cm) : rea [12:1.296] [N/A:N/A] Volume (cm) : [12:-399.20%] [N/A:N/A] % Reduction in A rea: [12:-511.30%] [N/A:N/A] % Reduction in Volume: [12:12] Position 1 (o'clock): [12:0.9] Maximum Distance 1 (cm): [12:6] Position 2 (o'clock): [12:0.8] Maximum Distance 2 (cm): [12:Yes] [N/A:N/A] Tunneling: [12:Category/Stage IV] [N/A:N/A] Classification: [  12:Medium] [N/A:N/A] Exudate A mount: [12:Serosanguineous] [N/A:N/A] Exudate Type: [12:red, brown] [N/A:N/A] Exudate Color: [12:Well defined, not attached] [N/A:N/A] Wound Margin: [12:Large (67-100%)] [N/A:N/A] Granulation A mount: [12:Red, Pink] [N/A:N/A] Granulation Quality: [12:None Present (0%)] [N/A:N/A] Necrotic A mount: [12:Fat Layer (Subcutaneous Tissue): Yes N/A] Exposed Structures: [12:Fascia: No Tendon: No Muscle: No Joint: No Bone: No Small (1-33%)] [N/A:N/A] Treatment Notes Wound #12 (Ischial Tuberosity) Wound Laterality: Left Cleanser Soap and Water Discharge Instruction: May shower and wash wound with dial antibacterial soap and water prior to dressing change. Wound Cleanser Discharge Instruction: Cleanse the wound with wound cleanser prior to applying a clean dressing using gauze sponges, not tissue or cotton balls. Peri-Wound Care Skin Prep Discharge Instruction: Use skin prep as directed Topical Primary Dressing Promogran Prisma Matrix, 4.34 (sq in) (silver collagen) Discharge Instruction: Moisten collagen with saline or hydrogel. Apply moistened gauze on top of prisma. Secondary Dressing ComfortFoam Border, 4x4 in (silicone border) Discharge Instruction: Apply over primary dressing as directed. Secured  With Compression Wrap Compression Stockings Add-Ons Electronic Signature(s) Signed: 08/28/2020 2:56:27 PM By: [name] DO Signed: 08/29/2020 4:26:30 PM By: Rhae Hammock RN Entered By: [name] on 08/28/2020 14:51:40 -------------------------------------------------------------------------------- Multi-Disciplinary Care Plan Details Patient Name: Date of Service: [name], [name]. 08/28/2020 11:00 A M Medical Record Number: [id number] Patient Account Number: [id number] Date of Birth/Sex: Treating RN: 07/10/1950 (70 y.o. [name], [name] Primary Care Reniya Mcclees: [name] Other Clinician: Referring Sissi Padia: Treating Quintasha Gren/Extender: [name] in Treatment: 31 Active Inactive Wound/Skin Impairment Nursing Diagnoses: Knowledge deficit related to ulceration/compromised skin integrity Goals: Patient/caregiver will verbalize understanding of skin care regimen Date Initiated: 12/30/2018 Target Resolution Date: 09/01/2020 Goal Status: Active Interventions: Assess patient/caregiver ability to obtain necessary supplies Assess patient/caregiver ability to perform ulcer/skin care regimen upon admission and as needed Assess ulceration(s) every visit Provide education on ulcer and skin care Treatment Activities: Skin care regimen initiated : 12/30/2018 Topical wound management initiated : 12/30/2018 Notes: Electronic Signature(s) Signed: 08/29/2020 4:26:30 PM By: Rhae Hammock RN Entered By: Rhae Hammock on 08/28/2020 12:47:43 -------------------------------------------------------------------------------- Pain Assessment Details Patient Name: Date of Service: [name]. 08/28/2020 11:00 A M Medical Record Number: [id number] Patient Account Number: [id number] Date of Birth/Sex: Treating RN: 04-Aug-1950 (70 y.o. [name] Primary Care Alvan Culpepper: [name] Other Clinician: Referring Chauntae Hults: Treating  Shadia Larose/Extender: [name] in Treatment: 70 Active Problems Location of Pain Severity and Description of Pain Patient Has Paino Yes Site Locations Pain Location: Generalized Pain With Dressing Change: No Duration of the Pain. Constant / Intermittento Intermittent Rate the pain. Current Pain Level: 3 Character of Pain Describe the Pain: Aching Pain Management and Medication Current Pain Management: Medication: Yes Is the Current Pain Management Adequate: Adequate How does your wound impact your activities of daily livingo Sleep: No Bathing: No Appetite: No Relationship With Others: No Bladder Continence: No Emotions: No Bowel Continence: No Work: No Toileting: No Drive: No Dressing: No Hobbies: No Engineer, maintenance) Signed: 08/28/2020 5:08:08 PM By: Baruch Gouty RN, BSN Entered By: Baruch Gouty on 08/28/2020 11:52:15 -------------------------------------------------------------------------------- Patient/Caregiver Education Details Patient Name: Date of Service: [name] 5/24/2022andnbsp11:00 A M Medical Record Number: [id number] Patient Account Number: [id number] Date of Birth/Gender: Treating RN: [birth year] (70 y.o. Benjaman Lobe Primary Care Physician: [name] Other Clinician: Referring Physician: Treating Physician/Extender: [name] in Treatment: 35 Education Assessment Education Provided To: Patient Education Topics Provided Wound/Skin Impairment: Methods: Explain/Verbal Responses: State content correctly  Electronic Signature(s) Signed: 08/29/2020 4:26:30 PM By: Rhae Hammock RN Entered By: Rhae Hammock on 08/28/2020 12:47:56 -------------------------------------------------------------------------------- Wound Assessment Details Patient Name: Date of Service: [name]. 08/28/2020 11:00 A M Medical Record Number: [id number] Patient Account Number:  [id number] Date of Birth/Sex: Treating RN: 03/03/1951 (70 y.o. Martyn Malay, Vaughan Basta Primary Care Jacier Gladu: [name] Other Clinician: Referring Taveon Enyeart: Treating Elka Satterfield/Extender: Diamantina Providence Weeks in Treatment: 86 Wound Status Wound Number: 12 Primary Pressure Ulcer Etiology: Wound Location: Left Ischial Tuberosity Wound Open Wounding Event: Pressure Injury Status: Date Acquired: 09/06/2018 Comorbid Anemia, Hypertension, Type II Diabetes, Osteoarthritis, Weeks Of Treatment: 86 History: Dementia, Paraplegia, Received Radiation Clustered Wound: No Photos Wound Measurements Length: (cm) 2.5 Width: (cm) 0.6 Depth: (cm) 1.1 Area: (cm) 1.178 Volume: (cm) 1.296 % Reduction in Area: -399.2% % Reduction in Volume: -511.3% Epithelialization: Small (1-33%) Tunneling: Yes Location 1 Position (o'clock): 12 Maximum Distance: (cm) 0.9 Location 2 Position (o'clock): 6 Maximum Distance: (cm) 0.8 Undermining: No Wound Description Classification: Category/Stage IV Wound Margin: Well defined, not attached Exudate Amount: Medium Exudate Type: Serosanguineous Exudate Color: red, brown Foul Odor After Cleansing: No Slough/Fibrino No Wound Bed Granulation Amount: Large (67-100%) Exposed Structure Granulation Quality: Red, Pink Fascia Exposed: No Necrotic Amount: None Present (0%) Fat Layer (Subcutaneous Tissue) Exposed: Yes Tendon Exposed: No Muscle Exposed: No Joint Exposed: No Bone Exposed: No Treatment Notes Wound #12 (Ischial Tuberosity) Wound Laterality: Left Cleanser Soap and Water Discharge Instruction: May shower and wash wound with dial antibacterial soap and water prior to dressing change. Wound Cleanser Discharge Instruction: Cleanse the wound with wound cleanser prior to applying a clean dressing using gauze sponges, not tissue or cotton balls. Peri-Wound Care Skin Prep Discharge Instruction: Use skin prep as directed Topical Primary  Dressing Promogran Prisma Matrix, 4.34 (sq in) (silver collagen) Discharge Instruction: Moisten collagen with saline or hydrogel. Apply moistened gauze on top of prisma. Secondary Dressing ComfortFoam Border, 4x4 in (silicone border) Discharge Instruction: Apply over primary dressing as directed. Secured With Compression Wrap Compression Stockings Environmental education officer) Signed: 08/28/2020 4:53:55 PM By: Sandre Kitty Signed: 08/28/2020 5:08:08 PM By: Baruch Gouty RN, BSN Entered By: Sandre Kitty on 08/28/2020 16:20:55 -------------------------------------------------------------------------------- Vitals Details Patient Name: Date of Service: [name], [name]. 08/28/2020 11:00 A M Medical Record Number: [id number] Patient Account Number: [id number] Date of Birth/Sex: Treating RN: 1951-01-28 (70 y.o. [name] Primary Care Phylis Javed: [name] Other Clinician: Referring Ahmya Bernick: Treating Skylynn Burkley/Extender: [name] in Treatment: 63 Vital Signs Time Taken: 11:51 Temperature (F): 97.8 Height (in): 63 Pulse (bpm): 94 Source: Stated Respiratory Rate (breaths/min): 18 Weight (lbs): 185 Blood Pressure (mmHg): 156/83 Source: Stated Reference Range: 80 - 120 mg / dl Body Mass Index (BMI): 32.8 Electronic Signature(s) Signed: 08/28/2020 5:08:08 PM By: Baruch Gouty RN, BSN Entered By: Baruch Gouty on 08/28/2020 11:51:53

## 2020-09-18 ENCOUNTER — Ambulatory Visit
Admission: RE | Admit: 2020-09-18 | Discharge: 2020-09-18 | Disposition: A | Discharge status: Home / Self Care | Payer: Medicare Other | Source: Ambulatory Visit | Attending: Internal Medicine | Admitting: Internal Medicine

## 2020-09-18 ENCOUNTER — Encounter (HOSPITAL_BASED_OUTPATIENT_CLINIC_OR_DEPARTMENT_OTHER): Payer: Medicare Other | Attending: Internal Medicine | Admitting: Internal Medicine

## 2020-09-18 ENCOUNTER — Other Ambulatory Visit: Payer: Self-pay | Admitting: Internal Medicine

## 2020-09-18 ENCOUNTER — Other Ambulatory Visit: Payer: Self-pay

## 2020-09-18 DIAGNOSIS — Z87442 Personal history of urinary calculi: Secondary | ICD-10-CM | POA: Insufficient documentation

## 2020-09-18 DIAGNOSIS — L98499 Non-pressure chronic ulcer of skin of other sites with unspecified severity: Secondary | ICD-10-CM

## 2020-09-18 DIAGNOSIS — Z886 Allergy status to analgesic agent status: Secondary | ICD-10-CM | POA: Insufficient documentation

## 2020-09-18 DIAGNOSIS — E039 Hypothyroidism, unspecified: Secondary | ICD-10-CM | POA: Diagnosis not present

## 2020-09-18 DIAGNOSIS — L89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, stage 4: Secondary | ICD-10-CM | POA: Insufficient documentation

## 2020-09-18 DIAGNOSIS — L97426 Non-pressure chronic ulcer of left heel and midfoot with bone involvement without evidence of necrosis: Secondary | ICD-10-CM | POA: Diagnosis not present

## 2020-09-18 DIAGNOSIS — G8220 Paraplegia, unspecified: Secondary | ICD-10-CM | POA: Diagnosis not present

## 2020-09-18 DIAGNOSIS — E11622 Type 2 diabetes mellitus with other skin ulcer: Secondary | ICD-10-CM | POA: Diagnosis present

## 2020-09-18 DIAGNOSIS — G894 Chronic pain syndrome: Secondary | ICD-10-CM | POA: Insufficient documentation

## 2020-09-18 NOTE — Progress Notes (Signed)
[name], [name] ([id number]) Visit Report for 09/18/2020 HPI Details Patient Name: Date of Service: [name], [name]. 09/18/2020 10:00 [place] Record Number: [id number] Patient Account Number: [id number] Date of Birth/Sex: Treating RN: February 11, 1951 (70 y.o. [name], [name] Primary Care Provider: [name] Other Clinician: Referring Provider: Treating Provider/Extender: Tommy Rainwater in Treatment: 89 History of Present Illness Location: open ulceration of the left gluteal area, left heel and right ankle for about 5 months. Quality: Patient reports No Pain. Severity: Patient states wound(s) are getting worse. Duration: Patient has had the wound for > 5 months prior to seeking treatment at the wound center Context: The wound occurred when the patient has been paraplegic for about 3 years. Modifying Factors: Wound improving due to current treatment. ssociated Signs and Symptoms: Patient reports having foul odor. A HPI Description: this 70 year old patient who is known to have hypertension, hypothyroidism, breast cancer, chronic pain syndrome, paraplegia was noted to have a left gluteal decubitus ulcer and was brought into the hospital. During the course of her hospitalization she was debrided in the operating room by ankle wound. Bone cultures were taken at that time but were negative but clinically she was treated for osteomyelitis because of the probing down to bone and open exposed bone. Home health has been giving her antibioticss which include vancomycin and Zosyn. The patient was a smoker until about 3 weeks ago and used to smoke about 10 cigarettes a day for a long while. 12/13/2014 - details of her operative note from 11/03/2014 were reviewed -- PROCEDURE: 1. Excisional debridement skin, subcutaneous, muscle left ischium 35 cm2 2. Excisional debridement skin, subcutaneous tissue left heel 27 cm2 3. Excisional debridement right ankle skin, subcutaneous, bone  30 cm2 01/24/2015 -- she has some issues with her wheelchair cushion but other than that is doing very well and has received Podus boots for her feet. 02/14/2015 -- she was using her old offloading boots and this seemed to have caused her a new pressure ulcer on the left posterior heel near the superior part just below the Achilles tendon. 03/07/2015 -- she has a new ulceration just to the left of the midline on her sacral region more on the left buttock and this has been there for Dr. Leland Johns and had all the wounds sharply debrided. The debridement was done for the left ischial wound, the left heel wound and the right about a week. 08/22/2015 -- was recently admitted to hospital between May 5 and 08/13/2015, with sepsis and leukocytosis due to a UTI. she was treated for a sepsis complicating Escherichia coli UTI and kidney stones. She also had metabolic and careful up at the secondary to pyelonephritis. He received broad-spectrum antibiotics initially and then received Macrobid as per urology. She was sent home on nitrofurantoin. during her admission she had a CT scan which showed exposed left ischial tuberosity without evidence of osteolysis. 09/12/2015-- the patient is having some issues with her air mattress and would like to get a opinion from medical modalities. 10/10/2015 -- the issue with her air mattress has not yet been sorted out and the new problem seems to be a lot of odor from the wound VAC. 11/27/2015 -- the patient was admitted to the hospital between July 23 and 10/31/2015. Her problems were sepsis, osteomyelitis of the pelvic bone and acute pyelonephritis. CT of the abdomen and pelvis was consistent with a left-sided pyelonephritis with hydronephrosis and also just showed new sclerosis of the posterior portion of  the left anterior pubic ramus suggestive of periosteal reaction consistent with osteomyelitis. She was treated for the osteomyelitis with infectious disease consult  recommending 6 weeks of IV antibiotics including vancomycin and Rocephin and the antibiotics were to go on until 12/10/2015. He was seen by Dr. Iran Planas plastic surgery and Dr. Linus Salmons of infectious disease. She had a suprapubic catheter placed during the admission. CT scan done on 10/28/2015 showed specifically -- New sclerosis of the posterior portion of the left inferior pubic ramus with aggressive periosteal reaction, consistent with osteomyelitis, with adjacent soft tissue gas compatible with previously described decubitus ulcer. 12/12/2015 -- she was recently seen by Dr. Linus Salmons, who noted good improvement and CRP and ESR compared to before and he has stopped her antibiotic as per plans to finish on September 4. The patient was encouraged to continue with wound care and consider hyperbaric oxygen therapy. Today she tells me that she has consented to undergo hyperbaric oxygen therapy and we can start the paperwork. 01/02/2016 -- her PCP had gained about 3 years but she still persists in having problems during hyperbaric oxygen therapy with some discomfort in the ears. 01/09/16; pressure area with underlying osteomyelitis in the left buttock. Wound bed itself has some slight amount of grayish surface slough however I do not think any debridement was necessary. There is no exposed bone soft tissue appears stable. She is using a wound VAC 01/16/16; back for weekly wound review in conjunction with HBO. She has a deep wound over the left initial tuberosity previously treated with 6 weeks of IV antibiotics for osteomyelitis. Wound bed looks reasonably healthy although the base of this is still precariously close to bone. She has been using a wound VAC. 01/23/2016 -- she has completed her course of antibiotics and this week the only new thing is her right great toe nail was avulsed and she has got an open wound over the nailbed. 01/31/16 she has completed her course of antibiotics. Her right great toenail  avulsed last week and she's been using silver alginate for this as well. 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 Did not sound like there was any optimistic discussion regarding reconstruction and the net result would be to continue with the wound VAC application. I will await the official reports. Addendum: -- she was seen at [facility] surgery service by Dr. Tressa Busman. After a thorough review and from what I understand spending 45 minutes with the patient his assessment has been noted by me in detail and the management options were: 1. Continued pressure offloading and wound care versus operative procedures including wound excision 2. Soft tissue and bone sampling 3. If the wound gets larger wound closure would be done using a variety of plastic surgical techniques including but not limited to skin substitute, possible skin graft, local versus regional flaps, negative pressure dressing application. 4. He discussed with her details of flap surgery and the risks associated 5. He made a comment that since the patient was operated on by Dr. Leland Johns of [facility] plastic surgery unit in [place] the patient may continue to follow-up there for further evaluation for surgical flap closure in the future. 03/19/2016 -- the patient continues to be rather depressed and frustrated  with her lack of rapid progress in healing this wound especially because she thought after hyperbaric oxygen therapy the wound would heal extremely fast. She now understands that was not the implied benefit on wound care which was the recommendation for hyperbaric oxygen therapy. I have had a lengthy discussion with the patient and her husband regarding her  options: 1. Continue with collagen and wound VAC for the primary dressing and offloading and all supportive care. 2. See Dr. Iran Planas for possible placement of Acell or Integra in the OR. 3. get a second opinion from a wound care center and surrounding regions/counties 05/07/2016 -- Note from Dr. Celedonio Miyamoto, who noted that the patient has declined flap surgery. She has discussed application of A cell, and try a few applications to see how the wound progresses. She is also recommended that we could apply products here in the wound center, like Oasis. during her preop workup it was found that her hemoglobin A1c was 11% and she has now been diagnosed as having diabetes mellitus and has been put on appropriate treatment by her PCP 05/28/2016 -- tells me her blood sugars have been doing well and she has an appointment to see her PCP in the next couple of weeks to check her hemoglobin A1c. Other than that she continues to do well. 06/25/2016 -- have not seen her back for the last month but she says her health has been about the same and she has an appointment to check the A1c next week 09/10/16 ---- was seen by Dr. Celedonio Miyamoto -- who applied Acell and saw her back in follow-up. She has recommended silver alginate to the wound every other day and cover with foam. If no significant drainage could transition to collagen every other day. She recommended discontinuing wound VAC. There were no plans to repeat application of Acell. The patient expressed that her husband could do the wound care as going to the Wound Ctr., would cost several $100 for each visit. 10/21/2016 -- her insurance company is getting her new mattress and she is pleased about that. Other than that she has been doing dressings with PolyMem Silver and has been doing very well 02/18/2017 -- she has gone through several changes of her mattress and has not been pleased with any of them. The ventricles are still working on trying to  fit her with the appropriate low air-loss mattress. She has a new wound on the gluteal area which is clearly separated from the original wound. 03/25/17-she is here in follow-up evaluation for her left ischialpressure ulcer. She remains unsatisfied with her pressure mattress. She admits to sitting multiple hours a day, in the bed. We have discussed offloading options. The wound does not appear infected. Nutrition does not appear to be a concern. Will follow-up in 4 weeks, if wound continues to be stalled may consider x-ray to evaluate for refractory osteomyelitis. 04/21/17; this is a patient that I don't know all that well. She has a chronic wound which at one point had underlying osteomyelitis in the left ischial tuberosity. This is a stage IV pressure ulcer. Over the last 3 months she has a stage II wound inferiorly to the original wound. The last time she was here her dressing was changed to silver collagen although the patient's husband who changes the dressing said that the collagen stuck to the wound and remove skin from the superficial area therefore he switched back to [place] 05/13/17; this is a patient we've been following for a left  ischial tuberosity wound which was stage IV at one point had underlying osteomyelitis. Over the last several months she's had a stage II wound just inferior and medial to the related to the wound. According to her husband he is using Endoform layer with collagen although this is not what I had last time. According to her husband they are using Elgie Congo with collagen although I don't quite know how that started. She was hospitalized from 1/20 through 04/30/16. This was related to a UTI. Her blood cultures were negative, urine culture showed multiple species. She did have a CT scan of the abdomen and pelvis which documented chronic osteomyelitis in the area of the wound inflammatory markers were unremarkable. She has had prior knowledge of osteomyelitis. It  looks as though she received IV antibiotics in 2017 and was treated with a course of hyperbaric oxygen. 05/28/17; the wound over the left ischial tuberosity is deeper today and abuts clearly on bone. Nursing intake reported drainage. I therefore culture of the wound. The more superficial area just below this looks about the same. They once again complained that there are mattress cover is not working although apparently [facility] is been noted to see this many times in the report is that the device is functional 06/18/17; the patient had a probing area on the left ischial tuberosity that was draining purulent fluid last time. This also clearly seemed to have open bone. Culture I did showed pansensitive pseudomonas including third generation cephalosporins. I treated this with cefdinir 300 twice a day for 10 days and things seem to have improved. She has a more superficial wound just underneath this area. Amazingly she has a new air mattress through [facility]. I think they gave this to her as a parking give. In any case this now works according to the patient may have something to do with why the areas are looking better. 07/09/17; the patient has a probing area in the left ischial tuberosity that still has some depth. However this is contracted in terms of the wound orifice although the depth is still roughly the same. There is no undermining. She also has the satellite wound which is more superficial. This appears to have a healthy surface we've been using silver collagen 08/06/17; the patient's wound is over the left ischial tuberosity and a satellite lesion just underneath this. The original wound was actually a deep stage 4 wound. We have made good progress in 2 months and there is no longer exposed bone here. 09/03/17; left ischial tuberosity actually appears to be quite healthy. I think we are making progress. No debridement is required. There is no surrounding erythema 10/01/17 I  follow this patient monthly for her left ischial tuberosity wound. There is 2 areas the original area and a satellite area. The satellite area looks a lot better there is no surrounding erythema. Her husband relates that he is having trouble maintaining the dressing. This has to do with the soft tissue around it. He states he puts the collagen in but he cannot make sure that it stays in even with the ABD pads and tape that he is been using 10/29/17; patient arrives with a better looking noon today. Some of the satellite lesions have closed. using Prisma 11/26/17; the patient has a large cone-shaped area with the tip of the Cone deep within her buttock soft tissue. The walls of the Cone are epithelialized however the base is still open. The area at the base of this looks  moist we've been using silver collagen. Will change to silver alginate 12/31/2017; the wound appears to have come in fairly nicely. Using silver alginate. There is no surrounding maceration or infection 01/28/18; there is still an open area here over the left initial tuberosity. Base of this however looks healthy. There is no surrounding infection 02/25/18; the area of its open is over the left ischial tuberosity. The base of this is where the wound is. This is a large inverted cone-shaped area with the wound at the tip. Dimensions of the wound at the tip are improved. There is a area of denuded skin about halfway towards the tip which her husband thinks may have happened today when he was bathing her. 04/20/17; the area is still open over the left initial tuberosity. 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 This is now completely closed over although it is closed over with a divot and skin over bone at the base of this. Continued aggressive  offloading will be necessary. 12/30/2018 READMISSION This is a 70 year old woman with chronic paraplegia. I picked her up for her care from Dr. Con Memos in this clinic after he departed. She had a stage IV pressure wound over the left ischial tuberosity. She was treated twice for her underlying osteomyelitis and this I believe firstly in 2016 and again in 2017. There were some plans at some point for flap closure of this however she was discovered to have uncontrolled diabetes and I do not think this was ever accomplished. She ultimately healed over in this clinic and was discharged in May. She has a large cone-shaped indentation with the tip of this going towards the left ischial tuberosity. It is not an easy area to examine but at that time I thought all of this was epithelialized. Apparently there was a reopening here shortly after she left the clinic last time. She was admitted to hospital at the end of June for Klebsiella bacteremia felt to be secondary to UTI. A CT scan of the pelvis is listed below and there was initially some concern that she had underlying osteomyelitis although I believe she was seen by infectious disease and that was felt to be not the case: I do not see any new cultures or inflammatory markers IMPRESSION: 1. No CT evidence for acute intra-abdominal or pelvic abnormality. Large volume of stool throughout the colon. 2. Enlarged fatty liver with fat sparing near the gallbladder fossa 3. Cortical scarring right kidney. Bilateral intrarenal stones without hydronephrosis. Thick-walled urinary bladder decompressed by suprapubic catheter 4. Deep left decubitus ulcer with underlying left ischial changes suggesting osteomyelitis. Her husband has been using silver collagen in the wound. She has not been systemically unwell no fever chills eating and drinking well. They rigorously offload this wound only getting up in the wheelchair when she is going to appointments the rest of the  time she is in bed. 10/8; wound measures larger and she now has exposed bone. We have been using silver alginate 11/12 still using silver alginate. The patient saw Dr. Megan Salon of infectious disease. She was started on Augmentin 500 mg twice daily. She is due to follow-up with Dr. Megan Salon I believe next week. Lab work Dr. Megan Salon requested showed a sedimentation rate of 28 and CRP of 20 although her CRP 1 year ago  was 18.8. Sedimentation rate 1 year ago was 11 basic metabolic panel showed a creatinine of 1.12 12/3; the patient followed up with Dr. Megan Salon yesterday. She is still on Augmentin twice daily. This was directed by Dr. Megan Salon. The patient's inflammatory markers have improved which is gratifying. Her C-reactive protein was repeated yesterday and follow-up booked with infectious disease in January. In addition I have been getting secure text messages I think from palliative care through [facility] [employer]. I think they were hoping to provide services to the patient in her home. They could not get a hold of the primary physician and so they reached out to me on 2 separate occasions. 12/17; patient last saw Dr. Megan Salon on 12/2. She is finishing up with Augmentin. Her C-reactive protein was 20 on 10/21, 10.1 on 11/19 and 17 on 12/2. The wound itself still has depth and undermining. We are using Santyl with the backing wet-to-dry 04/27/2019. The wound is gradually clearing up in terms of the surface although it is not filled in that much. Still abuts right against bone 2/4; patient with a deep pressure ulcer over the left ischial tuberosity. I thought she was going to follow-up with infectious disease to follow her inflammatory markers although the patient states that they stated that they did not need to see her unless we felt it was necessary. I will need to check their notes. In any case we ordered moistened silver collagen back with wet-to-dry to fill in the depth of  the wound although apparently [facility] sent silver alginate which they have been using since they were here the last time. Is obviously not what we ordered. 2/25. Not much change in this wound it is over the left ischial tuberosity recurrent wound. We have been using silver collagen with backing wet-to-dry. I think the wound is about the same. There is still some tunneling from about 10-12 o'clock over the ischial tuberosity itself 3/11; pressure ulcer over the left ischial tuberosity. Since she was last here the wound VAC was started and apparently going quite well. We are able to get the home health company that accepts [place] healthcare which is in itself sometimes problematic. There is been improvements in the wound the tunneling seems to be better and is contracted nicely 4/8; 1 month follow-up. Since she was last here we have been using silver collagen under a wound VAC. Some minor contraction I think in wound volume. She is cared for diligently by her husband including pressure relief, incontinence management, nutritional support etc. 6/1; this is almost a 26-monthfollow-up. She is been using silver collagen under wound VAC. Circular area over the left ischial tuberosity. She has been using silver collagen under wound VAC 7/8; 1 month follow-up. Silver collagen under the VAC not really a lot of progress. Tissue at the base of the wound which is right against bone and the tissue next that this does not look completely viable. She is not currently on any antibiotics, she had underlying osteomyelitis I need to look this over 8/16; we are using silver collagen under wound VAC to the left ischial tuberosity wound. Comes in today with absolutely no change in surface area or depth. There is no exposed bone. I did look over her infectious disease notes as I said I would do last time. She last saw Dr. CMegan Salonin December 2020. She completed 6 weeks of Augmentin. This was in response to a bone culture I  did showing methicillin susceptible staph aureus  and Enterococcus. She was supposed to come back to see Dr. Megan Salon at some point although they say that that appointment was canceled unless I chose to recommend return. I think there was supposed to be follow-up with inflammatory markers but I cannot see that that was ever done. She has not been on antibiotics since 9/21; monthly follow-up. We received a call from home health nurse last evening to report green drainage coming out of the wound. Lab work I ordered last time showed a white count of 5.2 a sedimentation rate of 45 and a C-reactive protein of 25 however neither one of the 2 values are substantially different from her previous values in October 2020 or December 2020. Both are slightly higher but only marginally. Otherwise no new complaints from the patient or her husband 10/19; 1 month follow-up. PCR culture I did last time showed medium quantities of Pseudomonas lower quantities Klebsiella and Enterococcus faecalis group B strep and Peptostreptococcus. I gave her Augmentin for 2 weeks. I am not really sure of my choice of this I would not cover Pseudomonas. She is still having green drainage. Wound itself looks satisfactory there is not a lot of depth wound bed looks healthy 11/16; patient has completed the antibiotics still using gentamicin and silver alginate on the wound. There is improvement in the surface area 12/21; in general the area on the buttock looks somewhat better. Surface looks healthy although I do not know that there is been much improvement in the wound volume. We have been using silver alginate and Hydrofera Blue. Less drainage. In passing the husband showed me an abrasion injury on the left anterior tibia. Covered in necrotic surface. He has noticed this for about a week and has been putting silver collagen on it. He is completely uncertain about how this happened 1/25; monthly follow-up. The area on the left buttock is  about the same. This does not go to bone but a fairly deep wound surface of the wound is of questionable viability. The abrasion injury that they showed me last time apparently was closed out by home health because they thought it was healed but certainly is not although it is just about healed. As a result they haven't been applying anything to this area Finally I did discuss with the patient and her husband the idea of an advanced treatment product to try and get a proper base to this wound I was thinking of Puraply however actually the patient points out that her co-pay for coming to visit [place] i.e. the facility, charge would be unaffordable if they have to, on a weekly basis 2/22; pressure ulcer on the left buttock appears deeper to me and abuts on the ischial tuberosity. I thought initially there was exposed bone but there is a rim of tissue over this area. She also has a superficial over the right anterior mid tibia. Been using silver collagen to these areas without much success. I have looked over the patient's past history with regards to the area on the left ischium. She did have underlying osteomyelitis here dating back I think to late 2020. She saw Dr. Megan Salon she received a 6-week course of oral antibiotics in response to a bone culture that I did. This does not appear to be infected but it certainly has not been improving in terms of granulation. I do not believe she has had any recent imaging studies 3/29; 1 month follow-up. Pressure ulcer on the left buttock which she has been dealing with with  for a number of years. She was treated for underlying osteomyelitis at 1.2 or 3 years ago I think with infectious disease help. She also had I think a flap closure by Dr. Leland Johns and that lasted for about a year and then reopened. I have not been able to get this patient to progress towards healing although truthfully the wound is absolutely no worse. We have been using Hydrofera Blue 4/26;  patient presents for 1 week follow-up. She has been using silver collagen to the area every other day. She has home health that comes out once a week to help with dressing changes as well. The patient is interested in trying a skin substitute over this area. She states she is trying to relieve pressure off of it most of the day. 5/24; patient presents for 1 month follow-up. She has been using silver collagen to the area every other day. She has no complaints today. She is interested in the skin substitute. She tries to leave relieve pressure off Her bottom however is not able to most of the day 6/14; using silver collagen to the area over the left ischial tuberosity. Wound does not appear to be doing particularly well. Open to bone Electronic Signature(s) Signed: 09/18/2020 4:29:11 PM By: Linton Ham MD Entered By: Linton Ham on 09/18/2020 10:49:34 -------------------------------------------------------------------------------- Physical Exam Details Patient Name: Date of Service: [name] 09/18/2020 10:00 A M Medical Record Number: [id number] Patient Account Number: [id number] Date of Birth/Sex: Treating RN: 1950/04/17 (70 y.o. [name], [name] Primary Care Provider: [name] Other Clinician: Referring Provider: Treating Provider/Extender: Tommy Rainwater in Treatment: 89 Constitutional Sitting or standing Blood Pressure is within target range for patient.. Pulse regular and within target range for patient.Marland Kitchen Respirations regular, non-labored and within target range.. Temperature is normal and within the target range for the patient.Marland Kitchen Appears in no distress. Notes Left ischial tuberosity/wound exam unfortunately there is exposed bone in roughly 25% of the base of this. This is not a large wound however not nearly as good as last time. There is also not a lot of tissue remaining over the rest of the bone surface. She is also tender around the wound which  is somewhat different for her. There is no purulent drainage however. Electronic Signature(s) Signed: 09/18/2020 4:29:11 PM By: Linton Ham MD Entered By: Linton Ham on 09/18/2020 10:51:00 -------------------------------------------------------------------------------- Physician Orders Details Patient Name: Date of Service: [name], [name]. 09/18/2020 10:00 A M Medical Record Number: [id number] Patient Account Number: [id number] Date of Birth/Sex: Treating RN: 1950/09/04 (70 y.o. 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 - Dr. Dellia Nims Return A Bathing/ Shower/ Hygiene May shower and wash wound with soap and water. - with dressingchanges Edema Control - Lymphedema / SCD / Other Elevate legs to the level of the heart or above for 30 minutes daily and/or when sitting, a frequency of: Avoid standing for long periods of time. Off-Loading Turn and reposition every 2 hours [place] wound care orders this week; continue Home Health for wound care. May utilize formulary  equivalent dressing for wound treatment orders unless otherwise specified. - wound care the skilled nursing same dressing. Please draw labwork and send results to [place] [facility]. Other Home Health Orders/Instructions: - [facility] Wound #12 - Ischial Tuberosity Wound Laterality: Left Cleanser: Soap and Water [facility]) Every Other Day/30 Days Discharge Instructions: May shower and wash  wound with dial antibacterial soap and water prior to dressing change. Cleanser: Wound Cleanser [facility]) Every Other Day/30 Days Discharge Instructions: Cleanse the wound with wound cleanser prior to applying a clean dressing using gauze sponges, not tissue or cotton balls. Peri-Wound Care: Skin Prep [facility]) Every Other Day/30 Days Discharge Instructions: Use skin prep as directed Prim Dressing: Promogran Prisma Matrix, 4.34 (sq in) (silver collagen) (Home Health) Every Other Day/30 Days ary Discharge Instructions: Moisten collagen with saline or hydrogel. Apply moistened gauze on top of prisma. Secondary Dressing: ComfortFoam Border, 4x4 in (silicone border) [facility]) Every Other Day/30 Days Discharge Instructions: Apply over primary dressing as directed. Laboratory C reactive protein [Mass/volume] in Serum or Plasma (CHEM) - ****HOME HEALTH TO DRAW LAB WORK AND SEND RESULTS TO [facility].*** LOINC Code: [id number] Convenience Name: C Reactive Protein in serum or plasma CBC W A Differential panel in Blood (HEM-CBC) - ****HOME HEALTH TO DRAW LAB WORK AND SEND RESULTS TO [facility].*** LOINC Code: [phone] Convenience Name: CBC W Auto Differential panel Comprehensive 2000 panel in Serum or Plasma (CHEM-panel) - ****HOME HEALTH TO DRAW LAB WORK AND SEND RESULTS TO [facility].*** LOINC Code: [phone] Convenience Name: Comprehensive metabolic panel=CMS Erythrocyte sedimentation rate (HEM) - ****HOME HEALTH TO DRAW LAB WORK AND SEND RESULTS TO [facility].*** LOINC Code: [id number] Convenience Name: Sed rate-method unspecified Radiology X-ray, pelvic - x-ray of pelvic related to nonhealing pressure ulcer looking for infection. CPT code - (ICD10 [phone] - Pressure ulcer of left buttock, stage 4) Electronic Signature(s) Signed: 09/18/2020 4:29:11 PM By: Linton Ham MD Signed: 09/18/2020 6:05:55 PM By: Deon Pilling Entered By:  Deon Pilling on 09/18/2020 10:39:52 Prescription 09/18/2020 -------------------------------------------------------------------------------- Vivar, Nira M. Linton Ham MD Patient Name: Provider: September 27, 1950 [id number] Date of Birth: NPI#: [id number] Sex: DEA #: [phone] Phone #: License #: [place] Patient Address: [address] [place], [place] [address] [place], [place] [address] [phone] Allergies Iodinated Contrast Media - IV Dye; aspirin; Sulfa (Sulfonamide Antibiotics) Provider's Orders X-ray, pelvic - ICD10: T46.568 - x-ray of pelvic related to nonhealing pressure ulcer looking for infection. CPT code Hand Signature: Date(s): Prescription 09/18/2020 Hernon, Kelyn M. Linton Ham MD Patient Name: Provider: Jan 03, 1951 [id number] Date of Birth: NPI#: [id number] Sex: DEA #: [phone] Phone #: License #: [place] Patient Address: [address] [place], [place] [address] [place], [place] [address] [phone] Allergies Iodinated Contrast Media - IV Dye; aspirin; Sulfa (Sulfonamide Antibiotics) Provider's Orders C reactive protein [Mass/volume] in Serum or Plasma - ****HOME HEALTH TO DRAW LAB WORK AND SEND RESULTS TO [place].*** LOINC Code: [id number] Convenience Name: C Reactive Protein in serum or plasma Hand Signature: Date(s): Prescription 09/18/2020 Skillern, Quantasia M. Linton Ham MD Patient Name: Provider: 06/22/1950 [id number] Date of Birth: NPI#: [id number] Sex: DEA #: [phone] Phone #: License #: [place] Patient Address: [address] [place], [place] [address]  [address] [place], [place] [address] [phone] Allergies Iodinated Contrast Media - IV Dye; aspirin; Sulfa  (Sulfonamide Antibiotics) Provider's Orders CBC W A Differential panel in Blood - ****HOME HEALTH TO DRAW LAB WORK AND SEND RESULTS TO [facility].*** LOINC Code: [phone] Convenience Name: CBC W Auto Differential panel Hand Signature: Date(s): Prescription 09/18/2020 Stthomas, Zan M. Linton Ham MD Patient Name: Provider: [birth year] [id number] Date of Birth: NPI#: [id number] Sex: DEA #: [phone] Phone #: License #: [place] Patient Address: [address] [place], [place] [address] [place], [place] [address] [phone] Allergies Iodinated Contrast Media - IV Dye; aspirin; Sulfa (Sulfonamide Antibiotics) Provider's Orders Comprehensive 2000 panel in Serum or Plasma - ****HOME HEALTH TO DRAW LAB WORK AND SEND RESULTS TO [place].*** LOINC Code: [phone] Convenience Name: Comprehensive metabolic panel=CMS Hand Signature: Date(s): Prescription 09/18/2020 Wuertz, Kazi M. Linton Ham MD Patient Name: Provider: 1950-11-30 [id number] Date of Birth: NPI#: [id number] Sex: DEA #: [phone] Phone #: License #: [place] Patient Address: [address] [place], [place] [address] [place], [place] [address] [phone] Allergies Iodinated Contrast Media - IV Dye; aspirin; Sulfa (Sulfonamide Antibiotics) Provider's Orders Erythrocyte sedimentation rate - ****HOME HEALTH TO DRAW LAB WORK AND SEND RESULTS TO [place].*** LOINC Code: [id number] Convenience Name: Sed rate-method unspecified Hand Signature: Date(s): Electronic Signature(s) Signed: 09/18/2020 4:29:11 PM By: Linton Ham MD Signed: 09/18/2020 6:05:55 PM By: Deon Pilling Entered By: Deon Pilling on 09/18/2020  10:39:53 -------------------------------------------------------------------------------- Problem List Details Patient Name: Date of Service: [name], [name]. 09/18/2020 10:00 A M Medical Record Number: [id number] Patient Account Number: [id number] Date of Birth/Sex: Treating RN: 08/27/1950 (70 y.o. [name], [name] Primary Care Provider: [name] Other Clinician: Referring Provider: Treating Provider/Extender: Tommy Rainwater in Treatment: 78 Active Problems ICD-10 Encounter Code Description Active Date MDM Diagnosis 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, stage 4 12/30/2018 No Yes E11.622 Type 2 diabetes mellitus with other skin ulcer 12/30/2018 No Yes Inactive Problems ICD-10 Code Description Active Date Inactive Date G82.20 Paraplegia, unspecified 12/30/2018 12/30/2018 M86.68 Other chronic osteomyelitis, other site 02/17/2019 02/17/2019 L97.811 Non-pressure chronic ulcer of other part of right lower leg limited to breakdown of skin 03/27/2020 03/27/2020 S80.811D Abrasion, right lower leg, subsequent encounter 03/27/2020 03/27/2020 Resolved Problems Electronic Signature(s) Signed: 09/18/2020 4:29:11 PM By: Linton Ham MD Entered By: Linton Ham on 09/18/2020 10:48:18 -------------------------------------------------------------------------------- Progress Note Details Patient Name: Date of Service: [name] 09/18/2020 10:00 A M Medical Record Number: [id number] Patient Account Number: [id number] Date of Birth/Sex: Treating RN: 1951/03/09 (70 y.o. [name], [name] Primary Care Provider: [name] Other Clinician: Referring Provider: Treating Provider/Extender: Tommy Rainwater in Treatment: 89 Subjective History of Present Illness (HPI) The following HPI elements were documented for the patient's wound: Location: open ulceration of the left gluteal area, left heel and right ankle for about 5 months. Quality: Patient  reports No Pain. Severity: Patient states wound(s) are getting worse. Duration: Patient has had the wound for > 5 months prior to seeking treatment at the wound center Context: The wound occurred when the patient has been paraplegic for about 3 years. Modifying Factors: Wound improving due to current treatment. Associated Signs and Symptoms: Patient reports having foul odor. this 70 year old patient who is known to have hypertension, hypothyroidism, breast  cancer, chronic pain syndrome, paraplegia was noted to have a left gluteal decubitus ulcer and was brought into the hospital. During the course of her hospitalization she was debrided in the operating room by ankle wound. Bone cultures were taken at that time but were negative but clinically she was treated for osteomyelitis because of the probing down to bone and open exposed bone. Home health has been giving her antibioticss which include vancomycin and Zosyn. The patient was a smoker until about 3 weeks ago and used to smoke about 10 cigarettes a day for a long while. 12/13/2014 - details of her operative note from 11/03/2014 were reviewed -- PROCEDURE: 1. Excisional debridement skin, subcutaneous, muscle left ischium 35 cm2 2. Excisional debridement skin, subcutaneous tissue left heel 27 cm2 3. Excisional debridement right ankle skin, subcutaneous, bone 30 cm2 01/24/2015 -- she has some issues with her wheelchair cushion but other than that is doing very well and has received Podus boots for her feet. 02/14/2015 -- she was using her old offloading boots and this seemed to have caused her a new pressure ulcer on the left posterior heel near the superior part just below the Achilles tendon. 03/07/2015 -- she has a new ulceration just to the left of the midline on her sacral region more on the left buttock and this has been there for Dr. Leland Johns and had all the wounds sharply debrided. The debridement was done for the left ischial wound, the  left heel wound and the right about a week. 08/22/2015 -- was recently admitted to hospital between May 5 and 08/13/2015, with sepsis and leukocytosis due to a UTI. she was treated for a sepsis complicating Escherichia coli UTI and kidney stones. She also had metabolic and careful up at the secondary to pyelonephritis. He received broad-spectrum antibiotics initially and then received Macrobid as per urology. She was sent home on nitrofurantoin. during her admission she had a CT scan which showed exposed left ischial tuberosity without evidence of osteolysis. 09/12/2015-- the patient is having some issues with her air mattress and would like to get a opinion from medical modalities. 10/10/2015 -- the issue with her air mattress has not yet been sorted out and the new problem seems to be a lot of odor from the wound VAC. 11/27/2015 -- the patient was admitted to the hospital between July 23 and 10/31/2015. Her problems were sepsis, osteomyelitis of the pelvic bone and acute pyelonephritis. CT of the abdomen and pelvis was consistent with a left-sided pyelonephritis with hydronephrosis and also just showed new sclerosis of the posterior portion of the left anterior pubic ramus suggestive of periosteal reaction consistent with osteomyelitis. She was treated for the osteomyelitis with infectious disease consult recommending 6 weeks of IV antibiotics including vancomycin and Rocephin and the antibiotics were to go on until 12/10/2015. He was seen by Dr. Iran Planas plastic surgery and Dr. Linus Salmons of infectious disease. She had a suprapubic catheter placed during the admission. CT scan done on 10/28/2015 showed specifically -- New sclerosis of the posterior portion of the left inferior pubic ramus with aggressive periosteal reaction, consistent with osteomyelitis, with adjacent soft tissue gas compatible with previously described decubitus ulcer. 12/12/2015 -- she was recently seen by Dr. Linus Salmons, who noted good  improvement and CRP and ESR compared to before and he has stopped her antibiotic as per plans to finish on September 4. The patient was encouraged to continue with wound care and consider hyperbaric oxygen therapy. Today she tells me that she has  consented to undergo hyperbaric oxygen therapy and we can start the paperwork. 01/02/2016 -- her PCP had gained about 3 years but she still persists in having problems during hyperbaric oxygen therapy with some discomfort in the ears. 01/09/16; pressure area with underlying osteomyelitis in the left buttock. Wound bed itself has some slight amount of grayish surface slough however I do not think any debridement was necessary. There is no exposed bone soft tissue appears stable. She is using a wound VAC 01/16/16; back for weekly wound review in conjunction with HBO. She has a deep wound over the left initial tuberosity previously treated with 6 weeks of IV antibiotics for osteomyelitis. Wound bed looks reasonably healthy although the base of this is still precariously close to bone. She has been using a wound VAC. 01/23/2016 -- she has completed her course of antibiotics and this week the only new thing is her right great toe nail was avulsed and she has got an open wound over the nailbed. 01/31/16 she has completed her course of antibiotics. Her right great toenail avulsed last week and she's been using silver alginate for this as well. Still using a wound VAC to the substantial stage IV wound over the left ischial tuberosity 03/05/2016 -- the patient has had a opinion from the plastic surgery group at [facility] and details of this are not available yet but the patient's verbal report has been heard by me. Did not sound like there was any optimistic discussion regarding reconstruction and the net result would be to continue with the wound VAC application. I will await the official reports. Addendum: -- she was seen at [facility]  surgery service by Dr. Tressa Busman. After a thorough review and from what I understand spending 45 minutes with the patient his assessment has been noted by me in detail and the management options were: 1. Continued pressure offloading and wound care versus operative procedures including wound excision 2. Soft tissue and bone sampling 3. If the wound gets larger wound closure would be done using a variety of plastic surgical techniques including but not limited to skin substitute, possible skin graft, local versus regional flaps, negative pressure dressing application. 4. He discussed with her details of flap surgery and the risks associated 5. He made a comment that since the patient was operated on by Dr. Leland Johns of [facility] plastic surgery unit in [place] the patient may continue to follow-up there for further evaluation for surgical flap closure in the future. 03/19/2016 -- the patient continues to be rather depressed and frustrated with her lack of rapid progress in healing this wound especially because she thought after hyperbaric oxygen therapy the wound would heal extremely fast. She now understands that was not the implied benefit on wound care which was the recommendation for hyperbaric oxygen therapy. I have had a lengthy discussion with the patient and her husband regarding her options: 1. Continue with collagen and wound VAC for the primary dressing and offloading and all supportive care. 2. See Dr. Iran Planas for possible placement of Acell or Integra in the OR. 3. get a second opinion from a wound care center and surrounding regions/counties 05/07/2016 -- Note from Dr. Celedonio Miyamoto, who noted that the patient has declined flap surgery. She has discussed application of A cell, and try a few applications to see how the wound progresses. She is also recommended that we could apply products here in the wound center, like Oasis. during her preop workup it  was found  that her hemoglobin A1c was 11% and she has now been diagnosed as having diabetes mellitus and has been put on appropriate treatment by her PCP 05/28/2016 -- tells me her blood sugars have been doing well and she has an appointment to see her PCP in the next couple of weeks to check her hemoglobin A1c. Other than that she continues to do well. 06/25/2016 -- have not seen her back for the last month but she says her health has been about the same and she has an appointment to check the A1c next week 09/10/16 ---- was seen by Dr. Celedonio Miyamoto -- who applied Acell and saw her back in follow-up. She has recommended silver alginate to the wound every other day and cover with foam. If no significant drainage could transition to collagen every other day. She recommended discontinuing wound VAC. There were no plans to repeat application of Acell. The patient expressed that her husband could do the wound care as going to the Wound Ctr., would cost several $100 for each visit. 10/21/2016 -- her insurance company is getting her new mattress and she is pleased about that. Other than that she has been doing dressings with PolyMem Silver and has been doing very well 02/18/2017 -- she has gone through several changes of her mattress and has not been pleased with any of them. The ventricles are still working on trying to fit her with the appropriate low air-loss mattress. She has a new wound on the gluteal area which is clearly separated from the original wound. 03/25/17-she is here in follow-up evaluation for her left ischialpressure ulcer. She remains unsatisfied with her pressure mattress. She admits to sitting multiple hours a day, in the bed. We have discussed offloading options. The wound does not appear infected. Nutrition does not appear to be a concern. Will follow-up in 4 weeks, if wound continues to be stalled may consider x-ray to evaluate for refractory osteomyelitis. 04/21/17; this is a patient that I  don't know all that well. She has a chronic wound which at one point had underlying osteomyelitis in the left ischial tuberosity. This is a stage IV pressure ulcer. Over the last 3 months she has a stage II wound inferiorly to the original wound. The last time she was here her dressing was changed to silver collagen although the patient's husband who changes the dressing said that the collagen stuck to the wound and remove skin from the superficial area therefore he switched back to [place] 05/13/17; this is a patient we've been following for a left ischial tuberosity wound which was stage IV at one point had underlying osteomyelitis. Over the last several months she's had a stage II wound just inferior and medial to the related to the wound. According to her husband he is using Endoform layer with collagen although this is not what I had last time. According to her husband they are using Elgie Congo with collagen although I don't quite know how that started. She was hospitalized from 1/20 through 04/30/16. This was related to a UTI. Her blood cultures were negative, urine culture showed multiple species. She did have a CT scan of the abdomen and pelvis which documented chronic osteomyelitis in the area of the wound inflammatory markers were unremarkable. She has had prior knowledge of osteomyelitis. It looks as though she received IV antibiotics in 2017 and was treated with a course of hyperbaric oxygen. 05/28/17; the wound over the left ischial tuberosity is deeper today  and abuts clearly on bone. Nursing intake reported drainage. I therefore culture of the wound. The more superficial area just below this looks about the same. They once again complained that there are mattress cover is not working although apparently [facility] is been noted to see this many times in the report is that the device is functional 06/18/17; the patient had a probing area on the left ischial tuberosity that was  draining purulent fluid last time. This also clearly seemed to have open bone. Culture I did showed pansensitive pseudomonas including third generation cephalosporins. I treated this with cefdinir 300 twice a day for 10 days and things seem to have improved. She has a more superficial wound just underneath this area. Amazingly she has a new air mattress through [facility]. I think they gave this to her as a parking give. In any case this now works according to the patient may have something to do with why the areas are looking better. 07/09/17; the patient has a probing area in the left ischial tuberosity that still has some depth. However this is contracted in terms of the wound orifice although the depth is still roughly the same. There is no undermining. ooShe also has the satellite wound which is more superficial. This appears to have a healthy surface we've been using silver collagen 08/06/17; the patient's wound is over the left ischial tuberosity and a satellite lesion just underneath this. The original wound was actually a deep stage 4 wound. We have made good progress in 2 months and there is no longer exposed bone here. 09/03/17; left ischial tuberosity actually appears to be quite healthy. I think we are making progress. No debridement is required. There is no surrounding erythema 10/01/17 I follow this patient monthly for her left ischial tuberosity wound. There is 2 areas the original area and a satellite area. The satellite area looks a lot better there is no surrounding erythema. Her husband relates that he is having trouble maintaining the dressing. This has to do with the soft tissue around it. He states he puts the collagen in but he cannot make sure that it stays in even with the ABD pads and tape that he is been using 10/29/17; patient arrives with a better looking noon today. Some of the satellite lesions have closed. using Prisma 11/26/17; the patient has a large cone-shaped  area with the tip of the Cone deep within her buttock soft tissue. The walls of the Cone are epithelialized however the base is still open. The area at the base of this looks moist we've been using silver collagen. Will change to silver alginate 12/31/2017; the wound appears to have come in fairly nicely. Using silver alginate. There is no surrounding maceration or infection 01/28/18; there is still an open area here over the left initial tuberosity. Base of this however looks healthy. There is no surrounding infection 02/25/18; the area of its open is over the left ischial tuberosity. The base of this is where the wound is. This is a large inverted cone-shaped area with the wound at the tip. Dimensions of the wound at the tip are improved. There is a area of denuded skin about halfway towards the tip which her husband thinks may have happened today when he was bathing her. 04/20/17; the area is still open over the left initial tuberosity. 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 This is now completely closed over although it is closed over with a divot and skin over bone at the base of this. Continued aggressive offloading will be necessary. 12/30/2018 READMISSION This is a 70 year old woman with chronic paraplegia. I picked her up for her care from Dr. Con Memos in this clinic after he departed. She had a stage IV pressure wound over the left ischial tuberosity. She was treated twice for her underlying osteomyelitis and this I believe firstly in 2016 and again in 2017. There were some plans at some point for flap closure of this however she was discovered to have uncontrolled diabetes and I do not think this was ever accomplished. She ultimately healed over in this clinic and was discharged in May. She has a  large cone-shaped indentation with the tip of this going towards the left ischial tuberosity. It is not an easy area to examine but at that time I thought all of this was epithelialized. Apparently there was a reopening here shortly after she left the clinic last time. She was admitted to hospital at the end of June for Klebsiella bacteremia felt to be secondary to UTI. A CT scan of the pelvis is listed below and there was initially some concern that she had underlying osteomyelitis although I believe she was seen by infectious disease and that was felt to be not the case: I do not see any new cultures or inflammatory markers IMPRESSION: 1. No CT evidence for acute intra-abdominal or pelvic abnormality. Large volume of stool throughout the colon. 2. Enlarged fatty liver with fat sparing near the gallbladder fossa 3. Cortical scarring right kidney. Bilateral intrarenal stones without hydronephrosis. Thick-walled urinary bladder decompressed by suprapubic catheter 4. Deep left decubitus ulcer with underlying left ischial changes suggesting osteomyelitis. Her husband has been using silver collagen in the wound. She has not been systemically unwell no fever chills eating and drinking well. They rigorously offload this wound only getting up in the wheelchair when she is going to appointments the rest of the time she is in bed. 10/8; wound measures larger and she now has exposed bone. We have been using silver alginate 11/12 still using silver alginate. The patient saw Dr. Megan Salon of infectious disease. She was started on Augmentin 500 mg twice daily. She is due to follow-up with Dr. Megan Salon I believe next week. Lab work Dr. Megan Salon requested showed a sedimentation rate of 28 and CRP of 20 although her CRP 1 year ago was 18.8. Sedimentation rate 1 year ago was 11 basic metabolic panel showed a creatinine of 1.12 12/3; the patient followed up with Dr. Megan Salon yesterday. She is still on Augmentin  twice daily. This was directed by Dr. Megan Salon. The patient's inflammatory markers have improved which is gratifying. Her C-reactive protein was repeated yesterday and follow-up booked with infectious disease in January. In addition I have been getting secure text messages I think from palliative care [facility] [employer]. I think they were hoping to provide services to the patient in her home. They could not get a hold of the primary physician and so they reached out to me on 2 separate occasions. 12/17; patient last saw Dr. Megan Salon on 12/2. She is finishing up with Augmentin. Her C-reactive protein was 20 on 10/21, 10.1 on 11/19 and 17 on 12/2. The wound itself still has depth and undermining. We are using Santyl  with the backing wet-to-dry 04/27/2019. The wound is gradually clearing up in terms of the surface although it is not filled in that much. Still abuts right against bone 2/4; patient with a deep pressure ulcer over the left ischial tuberosity. I thought she was going to follow-up with infectious disease to follow her inflammatory markers although the patient states that they stated that they did not need to see her unless we felt it was necessary. I will need to check their notes. In any case we ordered moistened silver collagen back with wet-to-dry to fill in the depth of the wound although apparently [facility] sent silver alginate which they have been using since they were here the last time. Is obviously not what we ordered. 2/25. Not much change in this wound it is over the left ischial tuberosity recurrent wound. We have been using silver collagen with backing wet-to-dry. I think the wound is about the same. There is still some tunneling from about 10-12 o'clock over the ischial tuberosity itself 3/11; pressure ulcer over the left ischial tuberosity. Since she was last here the wound VAC was started and apparently going quite well. We are able to get the home  health company that accepts [place] healthcare which is in itself sometimes problematic. There is been improvements in the wound the tunneling seems to be better and is contracted nicely 4/8; 1 month follow-up. Since she was last here we have been using silver collagen under a wound VAC. Some minor contraction I think in wound volume. She is cared for diligently by her husband including pressure relief, incontinence management, nutritional support etc. 6/1; this is almost a 45-month follow-up. She is been using silver collagen under wound VAC. Circular area over the left ischial tuberosity. She has been using silver collagen under wound VAC 7/8; 1 month follow-up. Silver collagen under the VAC not really a lot of progress. Tissue at the base of the wound which is right against bone and the tissue next that this does not look completely viable. She is not currently on any antibiotics, she had underlying osteomyelitis I need to look this over 8/16; we are using silver collagen under wound VAC to the left ischial tuberosity wound. Comes in today with absolutely no change in surface area or depth. There is no exposed bone. I did look over her infectious disease notes as I said I would do last time. She last saw Dr. Megan Salon in December 2020. She completed 6 weeks of Augmentin. This was in response to a bone culture I did showing methicillin susceptible staph aureus and Enterococcus. She was supposed to come back to see Dr. Megan Salon at some point although they say that that appointment was canceled unless I chose to recommend return. I think there was supposed to be follow-up with inflammatory markers but I cannot see that that was ever done. She has not been on antibiotics since 9/21; monthly follow-up. We received a call from home health nurse last evening to report green drainage coming out of the wound. Lab work I ordered last time showed a white count of 5.2 a sedimentation rate of 45 and a C-reactive  protein of 25 however neither one of the 2 values are substantially different from her previous values in October 2020 or December 2020. Both are slightly higher but only marginally. Otherwise no new complaints from the patient or her husband 10/19; 1 month follow-up. PCR culture I did last time showed medium quantities of Pseudomonas lower quantities Klebsiella and Enterococcus  faecalis group B strep and Peptostreptococcus. I gave her Augmentin for 2 weeks. I am not really sure of my choice of this I would not cover Pseudomonas. She is still having green drainage. Wound itself looks satisfactory there is not a lot of depth wound bed looks healthy 11/16; patient has completed the antibiotics still using gentamicin and silver alginate on the wound. There is improvement in the surface area 12/21; in general the area on the buttock looks somewhat better. Surface looks healthy although I do not know that there is been much improvement in the wound volume. We have been using silver alginate and Hydrofera Blue. Less drainage. In passing the husband showed me an abrasion injury on the left anterior tibia. Covered in necrotic surface. He has noticed this for about a week and has been putting silver collagen on it. He is completely uncertain about how this happened 1/25; monthly follow-up. The area on the left buttock is about the same. This does not go to bone but a fairly deep wound surface of the wound is of questionable viability. The abrasion injury that they showed me last time apparently was closed out by home health because they thought it was healed but certainly is not although it is just about healed. As a result they haven't been applying anything to this area Finally I did discuss with the patient and her husband the idea of an advanced treatment product to try and get a proper base to this wound I was thinking of Puraply however actually the patient points out that her co-pay for coming to visit  [place] the facility, charge would be unaffordable if they have to, on a weekly basis 2/22; pressure ulcer on the left buttock appears deeper to me and abuts on the ischial tuberosity. I thought initially there was exposed bone but there is a rim of tissue over this area. She also has a superficial over the right anterior mid tibia. Been using silver collagen to these areas without much success. I have looked over the patient's past history with regards to the area on the left ischium. She did have underlying osteomyelitis here dating back I think to late 2020. She saw Dr. Megan Salon she received a 6-week course of oral antibiotics in response to a bone culture that I did. This does not appear to be infected but it certainly has not been improving in terms of granulation. I do not believe she has had any recent imaging studies 3/29; 1 month follow-up. Pressure ulcer on the left buttock which she has been dealing with with for a number of years. She was treated for underlying osteomyelitis at 1.2 or 3 years ago I think with infectious disease help. She also had I think a flap closure by Dr. Leland Johns and that lasted for about a year and then reopened. I have not been able to get this patient to progress towards healing although truthfully the wound is absolutely no worse. We have been using Hydrofera Blue 4/26; patient presents for 1 week follow-up. She has been using silver collagen to the area every other day. She has home health that comes out once a week to help with dressing changes as well. The patient is interested in trying a skin substitute over this area. She states she is trying to relieve pressure off of it most of the day. 5/24; patient presents for 1 month follow-up. She has been using silver collagen to the area every other day. She has no complaints today.  She is interested in the skin substitute. She tries to leave relieve pressure off Her bottom however is not able to most of the  day 6/14; using silver collagen to the area over the left ischial tuberosity. Wound does not appear to be doing particularly well. Open to bone Objective Constitutional Sitting or standing Blood Pressure is within target range for patient.. Pulse regular and within target range for patient.Marland Kitchen Respirations regular, non-labored and within target range.. Temperature is normal and within the target range for the patient.Marland Kitchen Appears in no distress. Vitals Time Taken: 10:03 AM, Height: 63 in, Weight: 185 lbs, BMI: 32.8, Temperature: 97.7 F, Pulse: 84 bpm, Respiratory Rate: 18 breaths/min, Blood Pressure: 119/71 mmHg. General Notes: Left ischial tuberosity/wound exam unfortunately there is exposed bone in roughly 25% of the base of this. This is not a large wound however not nearly as good as last time. There is also not a lot of tissue remaining over the rest of the bone surface. She is also tender around the wound which is somewhat different for her. There is no purulent drainage however. Integumentary (Hair, Skin) Wound #12 status is Open. Original cause of wound was Pressure Injury. The date acquired was: 09/06/2018. The wound has been in treatment 89 weeks. The wound is located on the Left Ischial Tuberosity. The wound measures 3cm length x 1.1cm width x 1.1cm depth; 2.592cm^2 area and 2.851cm^3 volume. There is Fat Layer (Subcutaneous Tissue) exposed. There is no tunneling noted, however, there is undermining starting at 11:00 and ending at 1:00 with a maximum distance of 0.6cm. There is a medium amount of serosanguineous drainage noted. The wound margin is well defined and not attached to the wound base. There is large (67-100%) red, pink granulation within the wound bed. There is no necrotic tissue within the wound bed. 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 - Dr. Dellia Nims Bathing/ Shower/ Hygiene: May shower and wash wound with soap and water. - with dressingchanges Edema Control - Lymphedema / SCD / Other: Elevate legs to the level of the heart or above for 30 minutes daily and/or when sitting, a frequency of: Avoid standing for long periods of time. Off-Loading: Turn and reposition every 2 hours Home Health: New wound care orders this week; continue Home Health for wound care. May utilize formulary equivalent dressing for wound treatment orders unless otherwise specified. - wound care the skilled nursing same dressing. Please draw labwork and send results to [place] [facility]. Other Home Health Orders/Instructions: - [facility] ordered were: X-ray, pelvic - x-ray of pelvic related to nonhealing pressure ulcer looking for infection. CPT code Laboratory ordered were: C Reactive Protein in serum or plasma - ****HOME HEALTH TO DRAW LAB WORK AND SEND RESULTS TO [facility].***, CBC W Auto Differential panel - ****HOME HEALTH TO DRAW LAB WORK AND SEND RESULTS TO [facility].***, Comprehensive metabolic panel=CMS - ****HOME HEALTH TO DRAW LAB WORK AND SEND RESULTS TO [facility].***, Sed rate -method unspecified - ****HOME HEALTH TO DRAW LAB WORK AND SEND RESULTS TO [facility].*** WOUND #12: - Ischial Tuberosity Wound Laterality: Left Cleanser: Soap and Water [facility]) Every Other Day/30 Days Discharge Instructions: May shower and wash wound with dial antibacterial soap and water prior to dressing change. Cleanser: Wound Cleanser [facility]) Every Other Day/30 Days Discharge Instructions: Cleanse the  wound with wound cleanser prior to applying a clean dressing using gauze sponges, not tissue or cotton balls. Peri-Wound Care: Skin Prep [facility]) Every Other Day/30 Days Discharge Instructions: Use skin prep as directed Prim Dressing: Promogran Prisma Matrix, 4.34 (sq in)  (silver collagen) (Home Health) Every Other Day/30 Days ary Discharge Instructions: Moisten collagen with saline or hydrogel. Apply moistened gauze on top of prisma. Secondary Dressing: ComfortFoam Border, 4x4 in (silicone border) [facility]) Every Other Day/30 Days Discharge Instructions: Apply over primary dressing as directed. 1. I have continued with the silver collagen dressings 2. Given the deterioration in the wound surface in depth and now exposed bone I think I am obligated to try and determine whether there is underlying infection here. I have ordered pelvic x-ray to focus on the left ischial tuberosity also lab work to include a comprehensive metabolic panel CBC with differential sedimentation rate and C-reactive protein 3. If the tests I have ordered to date are not helpful she will likely need a CT scan. She has hardware in several areas including her C-spine therefore she is not going to be eligible most likely for an MRI. 4. She has a history of osteomyelitis in this area and I am looking to exclude this again as a cause for the recent deterioration. 5. Her husband again asked about the skin substitutes have told him that until I make sure about infection I do not think we can entertain this for now Electronic Signature(s) Signed: 09/18/2020 4:29:11 PM By: Linton Ham MD Entered By: Linton Ham on 09/18/2020 10:52:42 -------------------------------------------------------------------------------- SuperBill Details Patient Name: Date of Service: [name], [name]. 09/18/2020 Medical Record Number: [id number] Patient Account Number: [id number] Date of Birth/Sex: Treating RN: 03-31-1951 (70 y.o. [name], [name] Primary Care Provider: [name] Other Clinician: Referring Provider: Treating Provider/Extender: Tommy Rainwater in Treatment: 89 Diagnosis Coding ICD-10 Codes Code Description [phone] Pressure ulcer of left buttock, stage 4 E11.622  Type 2 diabetes mellitus with other skin ulcer Facility Procedures The patient participates with Medicare or their insurance follows the Medicare Facility Guidelines: CPT4 Code Description Modifier Quantity [id number] [place] VISIT-LEV 4 EST PT 1 Physician Procedures : CPT4 Code Description Modifier [id number] - WC PHYS LEVEL 4 - EST PT ICD-10 Diagnosis Description 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, stage 4 E11.622 Type 2 diabetes mellitus with other skin ulcer Quantity: 1 Electronic Signature(s) Signed: 09/18/2020 4:29:11 PM By: Linton Ham MD Signed: 09/18/2020 6:05:55 PM By: Deon Pilling Entered By: Deon Pilling on 09/18/2020 11:07:20

## 2020-09-19 NOTE — Progress Notes (Signed)
RANDE, DARIO ([id number]) Visit Report for 09/18/2020 Arrival Information Details Patient Name: Date of Service: SAREN, CORKERN. 09/18/2020 10:00 A M Medical Record Number: [id number] Patient Account Number: [id number] Date of Birth/Sex: Treating RN: June 03, 1950 (70 y.o. [name], [name] Primary Care Hazley Dezeeuw: [name] Other Clinician: Referring Jeana Kersting: Treating Cherre Kothari/Extender: Tommy Rainwater in Treatment: 28 Visit Information History Since Last Visit Added or deleted any medications: No Patient Arrived: Wheel Chair Any new allergies or adverse reactions: No Arrival Time: 10:03 Had a fall or experienced change in No Accompanied By: husband activities of daily living that may affect Transfer Assistance: None risk of falls: Patient Identification Verified: Yes Signs or symptoms of abuse/neglect since last visito No Secondary Verification Process Completed: Yes Hospitalized since last visit: No Patient Requires Transmission-Based Precautions: No Implantable device outside of the clinic excluding No Patient Has Alerts: No cellular tissue based products placed in the center since last visit: Has Dressing in Place as Prescribed: Yes Pain Present Now: No Electronic Signature(s) Signed: 09/18/2020 4:42:14 PM By: Sandre Kitty Entered By: Sandre Kitty on 09/18/2020 10:03:51 -------------------------------------------------------------------------------- Clinic Level of Care Assessment Details Patient Name: Date of Service: AYVAH, CAROLL 09/18/2020 10:00 A M Medical Record Number: [id number] Patient Account Number: [id number] Date of Birth/Sex: Treating RN: Apr 02, 1951 (70 y.o. [name], [name] Primary Care Cinthia Rodden: [name] Other Clinician: Referring Chantille Navarrete: Treating Lynnix Schoneman/Extender: Tommy Rainwater in Treatment: 89 Clinic Level of Care Assessment Items TOOL 4 Quantity Score X- 1 0 Use when only an EandM is  performed on FOLLOW-UP visit ASSESSMENTS - Nursing Assessment / Reassessment X- 1 10 Reassessment of Co-morbidities (includes updates in patient status) X- 1 5 Reassessment of Adherence to Treatment Plan ASSESSMENTS - Wound and Skin A ssessment / Reassessment []  - 0 Simple Wound Assessment / Reassessment - one wound X- 1 5 Complex Wound Assessment / Reassessment - multiple wounds X- 1 10 Dermatologic / Skin Assessment (not related to wound area) ASSESSMENTS - Focused Assessment []  - 0 Circumferential Edema Measurements - multi extremities X- 1 10 Nutritional Assessment / Counseling / Intervention []  - 0 Lower Extremity Assessment (monofilament, tuning fork, pulses) []  - 0 Peripheral Arterial Disease Assessment (using hand held doppler) ASSESSMENTS - Ostomy and/or Continence Assessment and Care []  - 0 Incontinence Assessment and Management []  - 0 Ostomy Care Assessment and Management (repouching, etc.) PROCESS - Coordination of Care []  - 0 Simple Patient / Family Education for ongoing care X- 1 20 Complex (extensive) Patient / Family Education for ongoing care X- 1 10 Staff obtains Programmer, systems, Records, T Results / Process Orders est X- 1 10 Staff telephones HHA, Nursing Homes / Clarify orders / etc []  - 0 Routine Transfer to another Facility (non-emergent condition) []  - 0 Routine Hospital Admission (non-emergent condition) []  - 0 New Admissions / Biomedical engineer / Ordering NPWT Apligraf, etc. , []  - 0 Emergency Hospital Admission (emergent condition) []  - 0 Simple Discharge Coordination X- 1 15 Complex (extensive) Discharge Coordination PROCESS - Special Needs []  - 0 Pediatric / Minor Patient Management []  - 0 Isolation Patient Management []  - 0 Hearing / Language / Visual special needs []  - 0 Assessment of Community assistance (transportation, D/C planning, etc.) []  - 0 Additional assistance / Altered mentation []  - 0 Support Surface(s) Assessment  (bed, cushion, seat, etc.) INTERVENTIONS - Wound Cleansing / Measurement []  - 0 Simple Wound Cleansing - one wound X- 1 5 Complex Wound Cleansing - multiple wounds  X- 1 5 Wound Imaging (photographs - any number of wounds) []  - 0 Wound Tracing (instead of photographs) []  - 0 Simple Wound Measurement - one wound X- 1 5 Complex Wound Measurement - multiple wounds INTERVENTIONS - Wound Dressings []  - 0 Small Wound Dressing one or multiple wounds X- 1 15 Medium Wound Dressing one or multiple wounds []  - 0 Large Wound Dressing one or multiple wounds []  - 0 Application of Medications - topical []  - 0 Application of Medications - injection INTERVENTIONS - Miscellaneous []  - 0 External ear exam []  - 0 Specimen Collection (cultures, biopsies, blood, body fluids, etc.) []  - 0 Specimen(s) / Culture(s) sent or taken to Lab for analysis []  - 0 Patient Transfer (multiple staff / Civil Service fast streamer / Similar devices) []  - 0 Simple Staple / Suture removal (25 or less) []  - 0 Complex Staple / Suture removal (26 or more) []  - 0 Hypo / Hyperglycemic Management (close monitor of Blood Glucose) []  - 0 Ankle / Brachial Index (ABI) - do not check if billed separately X- 1 5 Vital Signs Has the patient been seen at the hospital within the last three years: Yes Total Score: 130 Level Of Care: New/Established - Level 4 Electronic Signature(s) Signed: 09/18/2020 6:05:55 PM By: Deon Pilling Entered By: Deon Pilling on 09/18/2020 11:07:15 -------------------------------------------------------------------------------- Encounter Discharge Information Details Patient Name: Date of Service: [name], Molli Posey M. 09/18/2020 10:00 A M Medical Record Number: [id number] Patient Account Number: [id number] Date of Birth/Sex: Treating RN: [birth year] (70 y.o. Sue Lush Primary Care Jelani Vreeland: [name] Other Clinician: Referring Lene Mckay: Treating Imir Brumbach/Extender: Tommy Rainwater in  Treatment: 46 Encounter Discharge Information Items Discharge Condition: Stable Ambulatory Status: Wheelchair Discharge Destination: Home Transportation: Private Auto Accompanied ByRaechel Chute Schedule Follow-up Appointment: Yes Clinical Summary of Care: Provided on 09/18/2020 Form Type Recipient Paper Patient Patient Electronic Signature(s) Signed: 09/18/2020 12:28:48 PM By: Lorrin Jackson Entered By: Lorrin Jackson on 09/18/2020 12:28:48 -------------------------------------------------------------------------------- Lower Extremity Assessment Details Patient Name: Date of Service: [name] 09/18/2020 10:00 A M Medical Record Number: [id number] Patient Account Number: [id number] Date of Birth/Sex: Treating RN: 04/22/1950 (70 y.o. Sue Lush Primary Care Phyllis Abelson: [name] Other Clinician: Referring Zimere Dunlevy: Treating Vergie Zahm/Extender: Tommy Rainwater in Treatment: 79 Electronic Signature(s) Signed: 09/18/2020 12:27:37 PM By: Lorrin Jackson Entered By: Lorrin Jackson on 09/18/2020 12:27:37 -------------------------------------------------------------------------------- Multi Wound Chart Details Patient Name: Date of Service: [name], [name]. 09/18/2020 10:00 A M Medical Record Number: [id number] Patient Account Number: [id number] Date of Birth/Sex: Treating RN: [birth year] (70 y.o. [name], [name] Primary Care Lawerence Dery: [name] Other Clinician: Referring Sierria Bruney: Treating Ilan Kahrs/Extender: Tommy Rainwater in Treatment: 89 Vital Signs Height(in): 63 Pulse(bpm): 63 Weight(lbs): 185 Blood Pressure(mmHg): 119/71 Body Mass Index(BMI): 33 Temperature(F): 97.7 Respiratory Rate(breaths/min): 18 Photos: [12:No Photos Left Ischial Tuberosity] [N/A:N/A N/A] Wound Location: [12:Pressure Injury] [N/A:N/A] Wounding Event: [12:Pressure Ulcer] [N/A:N/A] Primary Etiology: [12:Anemia, Hypertension, Type II] [N/A:N/A] Comorbid  History: [12:Diabetes, Osteoarthritis, Dementia, Paraplegia, Received Radiation 09/06/2018] [N/A:N/A] Date Acquired: [12:89] [N/A:N/A] Weeks of Treatment: [12:Open] [N/A:N/A] Wound Status: [12:3x1.1x1.1] [N/A:N/A] Measurements L x W x D (cm) [12:2.592] [N/A:N/A] A (cm) : rea [12:2.851] [N/A:N/A] Volume (cm) : [12:-998.30%] [N/A:N/A] % Reduction in A rea: [12:-1244.80%] [N/A:N/A] % Reduction in Volume: [12:11] Starting Position 1 (o'clock): [12:1] Ending Position 1 (o'clock): [12:0.6] Maximum Distance 1 (cm): [12:Yes] [N/A:N/A] Undermining: [12:Category/Stage IV] [N/A:N/A] Classification: [12:Medium] [N/A:N/A] Exudate A mount: [12:Serosanguineous] [N/A:N/A] Exudate Type: [12:red, brown] [  N/A:N/A] Exudate Color: [12:Well defined, not attached] [N/A:N/A] Wound Margin: [12:Large (67-100%)] [N/A:N/A] Granulation A mount: [12:Red, Pink] [N/A:N/A] Granulation Quality: [12:None Present (0%)] [N/A:N/A] Necrotic A mount: [12:Fat Layer (Subcutaneous Tissue): Yes N/A] Exposed Structures: [12:Fascia: No Tendon: No Muscle: No Joint: No Bone: No None] [N/A:N/A] Treatment Notes Electronic Signature(s) Signed: 09/18/2020 4:29:11 PM By: Linton Ham MD Signed: 09/19/2020 6:22:58 PM By: Rhae Hammock RN Entered By: Linton Ham on 09/18/2020 10:48:33 -------------------------------------------------------------------------------- Multi-Disciplinary Care Plan Details Patient Name: Date of Service: [name], Molli Posey M. 09/18/2020 10:00 A M Medical Record Number: [id number] Patient Account Number: [id number] Date of Birth/Sex: Treating RN: 1950-07-06 (70 y.o. [name], [name] Primary Care Timi Reeser: [name] Other Clinician: Referring Trae Bovenzi: Treating Malcome Ambrocio/Extender: Tommy Rainwater in Treatment: 89 Active Inactive Wound/Skin Impairment Nursing Diagnoses: Knowledge deficit related to ulceration/compromised skin integrity Goals: Patient/caregiver will verbalize  understanding of skin care regimen Date Initiated: 12/30/2018 Target Resolution Date: 09/28/2020 Goal Status: Active Interventions: Assess patient/caregiver ability to obtain necessary supplies Assess patient/caregiver ability to perform ulcer/skin care regimen upon admission and as needed Assess ulceration(s) every visit Provide education on ulcer and skin care Treatment Activities: Skin care regimen initiated : 12/30/2018 Topical wound management initiated : 12/30/2018 Notes: Electronic Signature(s) Signed: 09/18/2020 6:05:55 PM By: Deon Pilling Entered By: Deon Pilling on 09/18/2020 10:34:27 -------------------------------------------------------------------------------- Pain Assessment Details Patient Name: Date of Service: [name] 09/18/2020 10:00 A M Medical Record Number: [id number] Patient Account Number: [id number] Date of Birth/Sex: Treating RN: 09-Oct-1950 (70 y.o. [name], [name] Primary Care Eliot Bencivenga: [name] Other Clinician: Referring Kjersti Dittmer: Treating Natalie Mceuen/Extender: Tommy Rainwater in Treatment: 89 Active Problems Location of Pain Severity and Description of Pain Patient Has Paino No Site Locations Pain Management and Medication Current Pain Management: Electronic Signature(s) Signed: 09/18/2020 4:42:14 PM By: Sandre Kitty Signed: 09/19/2020 6:22:58 PM By: Rhae Hammock RN Entered By: Sandre Kitty on 09/18/2020 10:04:18 -------------------------------------------------------------------------------- Patient/Caregiver Education Details Patient Name: Date of Service: [name] 6/14/2022andnbsp10:00 [place] Record Number: [id number] Patient Account Number: [id number] Date of Birth/Gender: Treating RN: Jun 22, 1950 (70 y.o. [name], [name] Primary Care Physician: [name] Other Clinician: Referring Physician: Treating Physician/Extender: Tommy Rainwater in Treatment: 56 Education  Assessment Education Provided To: Patient Education Topics Provided Wound/Skin Impairment: Handouts: Skin Care Do's and Dont's Methods: Explain/Verbal Responses: Reinforcements needed Electronic Signature(s) Signed: 09/18/2020 6:05:55 PM By: Deon Pilling Entered By: Deon Pilling on 09/18/2020 10:35:00 -------------------------------------------------------------------------------- Wound Assessment Details Patient Name: Date of Service: ELKY, FUNCHES 09/18/2020 10:00 A M Medical Record Number: [id number] Patient Account Number: [id number] Date of Birth/Sex: Treating RN: 1950/09/18 (70 y.o. [name], [name] Primary Care Normon Pettijohn: [name] Other Clinician: Referring Jafeth Mustin: Treating Kerly Rigsbee/Extender: Tommy Rainwater in Treatment: 89 Wound Status Wound Number: 12 Primary Pressure Ulcer Etiology: Wound Location: Left Ischial Tuberosity Wound Open Wounding Event: Pressure Injury Status: Date Acquired: 09/06/2018 Comorbid Anemia, Hypertension, Type II Diabetes, Osteoarthritis, Weeks Of Treatment: 89 History: Dementia, Paraplegia, Received Radiation Clustered Wound: No Photos Wound Measurements Length: (cm) 3 Width: (cm) 1.1 Depth: (cm) 1.1 Area: (cm) 2.592 Volume: (cm) 2.851 % Reduction in Area: -998.3% % Reduction in Volume: -1244.8% Epithelialization: None Tunneling: No Undermining: Yes Starting Position (o'clock): 11 Ending Position (o'clock): 1 Maximum Distance: (cm) 0.6 Wound Description Classification: Category/Stage IV Wound Margin: Well defined, not attached Exudate Amount: Medium Exudate Type: Serosanguineous Exudate Color: red, brown Foul Odor After Cleansing: No Slough/Fibrino No Wound Bed  Granulation Amount: Large (67-100%) Exposed Structure Granulation Quality: Red, Pink Fascia Exposed: No Necrotic Amount: None Present (0%) Fat Layer (Subcutaneous Tissue) Exposed: Yes Tendon Exposed: No Muscle Exposed: No Joint  Exposed: No Bone Exposed: No Treatment Notes Wound #12 (Ischial Tuberosity) Wound Laterality: Left Cleanser Soap and Water Discharge Instruction: May shower and wash wound with dial antibacterial soap and water prior to dressing change. Wound Cleanser Discharge Instruction: Cleanse the wound with wound cleanser prior to applying a clean dressing using gauze sponges, not tissue or cotton balls. Peri-Wound Care Skin Prep Discharge Instruction: Use skin prep as directed Topical Primary Dressing Promogran Prisma Matrix, 4.34 (sq in) (silver collagen) Discharge Instruction: Moisten collagen with saline or hydrogel. Apply moistened gauze on top of prisma. Secondary Dressing ComfortFoam Border, 4x4 in (silicone border) Discharge Instruction: Apply over primary dressing as directed. Secured With Compression Wrap Compression Stockings Environmental education officer) Signed: 09/18/2020 4:42:14 PM By: Sandre Kitty Signed: 09/19/2020 6:22:58 PM By: Rhae Hammock RN Signed: 09/19/2020 6:22:58 PM By: Rhae Hammock RN Entered By: Sandre Kitty on 09/18/2020 16:34:43 -------------------------------------------------------------------------------- Vitals Details Patient Name: Date of Service: [name], [name]. 09/18/2020 10:00 A M Medical Record Number: [id number] Patient Account Number: [id number] Date of Birth/Sex: Treating RN: January 05, 1951 (70 y.o. [name], [name] Primary Care Raschelle Wisenbaker: [name] Other Clinician: Referring Tranell Wojtkiewicz: Treating Debbera Wolken/Extender: Tommy Rainwater in Treatment: 89 Vital Signs Time Taken: 10:03 Temperature (F): 97.7 Height (in): 63 Pulse (bpm): 84 Weight (lbs): 185 Respiratory Rate (breaths/min): 18 Body Mass Index (BMI): 32.8 Blood Pressure (mmHg): 119/71 Reference Range: 80 - 120 mg / dl Electronic Signature(s) Signed: 09/18/2020 4:42:14 PM By: Sandre Kitty Entered By: Sandre Kitty on 09/18/2020 10:04:07

## 2020-10-02 ENCOUNTER — Other Ambulatory Visit: Payer: Self-pay

## 2020-10-02 ENCOUNTER — Encounter (HOSPITAL_BASED_OUTPATIENT_CLINIC_OR_DEPARTMENT_OTHER): Payer: Medicare Other | Admitting: Internal Medicine

## 2020-10-02 ENCOUNTER — Other Ambulatory Visit (HOSPITAL_COMMUNITY): Payer: Self-pay | Admitting: Internal Medicine

## 2020-10-02 DIAGNOSIS — L89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, stage 4: Secondary | ICD-10-CM

## 2020-10-02 DIAGNOSIS — E11622 Type 2 diabetes mellitus with other skin ulcer: Secondary | ICD-10-CM | POA: Diagnosis not present

## 2020-10-02 NOTE — Progress Notes (Addendum)
[name], [name] ([id number]) Visit Report for 10/02/2020 HPI Details Patient Name: Date of Service: [name], [name]. 10/02/2020 10:00 [place] Record Number: [id number] Patient Account Number: [id number] Date of Birth/Sex: Treating RN: 08/31/1950 (70 y.o. [name], [name] Primary Care Provider: [name] Other Clinician: Referring Provider: Treating Provider/Extender: Tommy Rainwater in Treatment: 93 History of Present Illness Location: open ulceration of the left gluteal area, left heel and right ankle for about 5 months. Quality: Patient reports No Pain. Severity: Patient states wound(s) are getting worse. Duration: Patient has had the wound for > 5 months prior to seeking treatment at the wound center Context: The wound occurred when the patient has been paraplegic for about 3 years. Modifying Factors: Wound improving due to current treatment. ssociated Signs and Symptoms: Patient reports having foul odor. A HPI Description: this 70 year old patient who is known to have hypertension, hypothyroidism, breast cancer, chronic pain syndrome, paraplegia was noted to have a left gluteal decubitus ulcer and was brought into the hospital. During the course of her hospitalization she was debrided in the operating room by ankle wound. Bone cultures were taken at that time but were negative but clinically she was treated for osteomyelitis because of the probing down to bone and open exposed bone. Home health has been giving her antibioticss which include vancomycin and Zosyn. The patient was a smoker until about 3 weeks ago and used to smoke about 10 cigarettes a day for a long while. 12/13/2014 - details of her operative note from 11/03/2014 were reviewed -- PROCEDURE: 1. Excisional debridement skin, subcutaneous, muscle left ischium 35 cm2 2. Excisional debridement skin, subcutaneous tissue left heel 27 cm2 3. Excisional debridement right ankle skin, subcutaneous, bone  30 cm2 01/24/2015 -- she has some issues with her wheelchair cushion but other than that is doing very well and has received Podus boots for her feet. 02/14/2015 -- she was using her old offloading boots and this seemed to have caused her a new pressure ulcer on the left posterior heel near the superior part just below the Achilles tendon. 03/07/2015 -- she has a new ulceration just to the left of the midline on her sacral region more on the left buttock and this has been there for Dr. Leland Johns and had all the wounds sharply debrided. The debridement was done for the left ischial wound, the left heel wound and the right about a week. 08/22/2015 -- was recently admitted to hospital between May 5 and 08/13/2015, with sepsis and leukocytosis due to a UTI. she was treated for a sepsis complicating Escherichia coli UTI and kidney stones. She also had metabolic and careful up at the secondary to pyelonephritis. He received broad-spectrum antibiotics initially and then received Macrobid as per urology. She was sent home on nitrofurantoin. during her admission she had a CT scan which showed exposed left ischial tuberosity without evidence of osteolysis. 09/12/2015-- the patient is having some issues with her air mattress and would like to get a opinion from medical modalities. 10/10/2015 -- the issue with her air mattress has not yet been sorted out and the new problem seems to be a lot of odor from the wound VAC. 11/27/2015 -- the patient was admitted to the hospital between July 23 and 10/31/2015. Her problems were sepsis, osteomyelitis of the pelvic bone and acute pyelonephritis. CT of the abdomen and pelvis was consistent with a left-sided pyelonephritis with hydronephrosis and also just showed new sclerosis of the posterior portion of  the left anterior pubic ramus suggestive of periosteal reaction consistent with osteomyelitis. She was treated for the osteomyelitis with infectious disease consult  recommending 6 weeks of IV antibiotics including vancomycin and Rocephin and the antibiotics were to go on until 12/10/2015. He was seen by Dr. Iran Planas plastic surgery and Dr. Linus Salmons of infectious disease. She had a suprapubic catheter placed during the admission. CT scan done on 10/28/2015 showed specifically -- New sclerosis of the posterior portion of the left inferior pubic ramus with aggressive periosteal reaction, consistent with osteomyelitis, with adjacent soft tissue gas compatible with previously described decubitus ulcer. 12/12/2015 -- she was recently seen by Dr. Linus Salmons, who noted good improvement and CRP and ESR compared to before and he has stopped her antibiotic as per plans to finish on September 4. The patient was encouraged to continue with wound care and consider hyperbaric oxygen therapy. Today she tells me that she has consented to undergo hyperbaric oxygen therapy and we can start the paperwork. 01/02/2016 -- her PCP had gained about 3 years but she still persists in having problems during hyperbaric oxygen therapy with some discomfort in the ears. 01/09/16; pressure area with underlying osteomyelitis in the left buttock. Wound bed itself has some slight amount of grayish surface slough however I do not think any debridement was necessary. There is no exposed bone soft tissue appears stable. She is using a wound VAC 01/16/16; back for weekly wound review in conjunction with HBO. She has a deep wound over the left initial tuberosity previously treated with 6 weeks of IV antibiotics for osteomyelitis. Wound bed looks reasonably healthy although the base of this is still precariously close to bone. She has been using a wound VAC. 01/23/2016 -- she has completed her course of antibiotics and this week the only new thing is her right great toe nail was avulsed and she has got an open wound over the nailbed. 01/31/16 she has completed her course of antibiotics. Her right great toenail  avulsed last week and she's been using silver alginate for this as well. 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 Did not sound like there was any optimistic discussion regarding reconstruction and the net result would be to continue with the wound VAC application. I will await the official reports. Addendum: -- she was seen at [facility] surgery service by Dr. Tressa Busman. After a thorough review and from what I understand spending 45 minutes with the patient his assessment has been noted by me in detail and the management options were: 1. Continued pressure offloading and wound care versus operative procedures including wound excision 2. Soft tissue and bone sampling 3. If the wound gets larger wound closure would be done using a variety of plastic surgical techniques including but not limited to skin substitute, possible skin graft, local versus regional flaps, negative pressure dressing application. 4. He discussed with her details of flap surgery and the risks associated 5. He made a comment that since the patient was operated on by Dr. Leland Johns of [facility] plastic surgery unit in [place] the patient may continue to follow-up there for further evaluation for surgical flap closure in the future. 03/19/2016 -- the patient continues to be rather depressed and frustrated  with her lack of rapid progress in healing this wound especially because she thought after hyperbaric oxygen therapy the wound would heal extremely fast. She now understands that was not the implied benefit on wound care which was the recommendation for hyperbaric oxygen therapy. I have had a lengthy discussion with the patient and her husband regarding her  options: 1. Continue with collagen and wound VAC for the primary dressing and offloading and all supportive care. 2. See Dr. Iran Planas for possible placement of Acell or Integra in the OR. 3. get a second opinion from a wound care center and surrounding regions/counties 05/07/2016 -- Note from Dr. Celedonio Miyamoto, who noted that the patient has declined flap surgery. She has discussed application of A cell, and try a few applications to see how the wound progresses. She is also recommended that we could apply products here in the wound center, like Oasis. during her preop workup it was found that her hemoglobin A1c was 11% and she has now been diagnosed as having diabetes mellitus and has been put on appropriate treatment by her PCP 05/28/2016 -- tells me her blood sugars have been doing well and she has an appointment to see her PCP in the next couple of weeks to check her hemoglobin A1c. Other than that she continues to do well. 06/25/2016 -- have not seen her back for the last month but she says her health has been about the same and she has an appointment to check the A1c next week 09/10/16 ---- was seen by Dr. Celedonio Miyamoto -- who applied Acell and saw her back in follow-up. She has recommended silver alginate to the wound every other day and cover with foam. If no significant drainage could transition to collagen every other day. She recommended discontinuing wound VAC. There were no plans to repeat application of Acell. The patient expressed that her husband could do the wound care as going to the Wound Ctr., would cost several $100 for each visit. 10/21/2016 -- her insurance company is getting her new mattress and she is pleased about that. Other than that she has been doing dressings with PolyMem Silver and has been doing very well 02/18/2017 -- she has gone through several changes of her mattress and has not been pleased with any of them. The ventricles are still working on trying to  fit her with the appropriate low air-loss mattress. She has a new wound on the gluteal area which is clearly separated from the original wound. 03/25/17-she is here in follow-up evaluation for her left ischialpressure ulcer. She remains unsatisfied with her pressure mattress. She admits to sitting multiple hours a day, in the bed. We have discussed offloading options. The wound does not appear infected. Nutrition does not appear to be a concern. Will follow-up in 4 weeks, if wound continues to be stalled may consider x-ray to evaluate for refractory osteomyelitis. 04/21/17; this is a patient that I don't know all that well. She has a chronic wound which at one point had underlying osteomyelitis in the left ischial tuberosity. This is a stage IV pressure ulcer. Over the last 3 months she has a stage II wound inferiorly to the original wound. The last time she was here her dressing was changed to silver collagen although the patient's husband who changes the dressing said that the collagen stuck to the wound and remove skin from the superficial area therefore he switched back to [place] 05/13/17; this is a patient we've been following for a left  ischial tuberosity wound which was stage IV at one point had underlying osteomyelitis. Over the last several months she's had a stage II wound just inferior and medial to the related to the wound. According to her husband he is using Endoform layer with collagen although this is not what I had last time. According to her husband they are using Elgie Congo with collagen although I don't quite know how that started. She was hospitalized from 1/20 through 04/30/16. This was related to a UTI. Her blood cultures were negative, urine culture showed multiple species. She did have a CT scan of the abdomen and pelvis which documented chronic osteomyelitis in the area of the wound inflammatory markers were unremarkable. She has had prior knowledge of osteomyelitis. It  looks as though she received IV antibiotics in 2017 and was treated with a course of hyperbaric oxygen. 05/28/17; the wound over the left ischial tuberosity is deeper today and abuts clearly on bone. Nursing intake reported drainage. I therefore culture of the wound. The more superficial area just below this looks about the same. They once again complained that there are mattress cover is not working although apparently [facility] is been noted to see this many times in the report is that the device is functional 06/18/17; the patient had a probing area on the left ischial tuberosity that was draining purulent fluid last time. This also clearly seemed to have open bone. Culture I did showed pansensitive pseudomonas including third generation cephalosporins. I treated this with cefdinir 300 twice a day for 10 days and things seem to have improved. She has a more superficial wound just underneath this area. Amazingly she has a new air mattress through [facility]. I think they gave this to her as a parking give. In any case this now works according to the patient may have something to do with why the areas are looking better. 07/09/17; the patient has a probing area in the left ischial tuberosity that still has some depth. However this is contracted in terms of the wound orifice although the depth is still roughly the same. There is no undermining. She also has the satellite wound which is more superficial. This appears to have a healthy surface we've been using silver collagen 08/06/17; the patient's wound is over the left ischial tuberosity and a satellite lesion just underneath this. The original wound was actually a deep stage 4 wound. We have made good progress in 2 months and there is no longer exposed bone here. 09/03/17; left ischial tuberosity actually appears to be quite healthy. I think we are making progress. No debridement is required. There is no surrounding erythema 10/01/17 I  follow this patient monthly for her left ischial tuberosity wound. There is 2 areas the original area and a satellite area. The satellite area looks a lot better there is no surrounding erythema. Her husband relates that he is having trouble maintaining the dressing. This has to do with the soft tissue around it. He states he puts the collagen in but he cannot make sure that it stays in even with the ABD pads and tape that he is been using 10/29/17; patient arrives with a better looking noon today. Some of the satellite lesions have closed. using Prisma 11/26/17; the patient has a large cone-shaped area with the tip of the Cone deep within her buttock soft tissue. The walls of the Cone are epithelialized however the base is still open. The area at the base of this looks  moist we've been using silver collagen. Will change to silver alginate 12/31/2017; the wound appears to have come in fairly nicely. Using silver alginate. There is no surrounding maceration or infection 01/28/18; there is still an open area here over the left initial tuberosity. Base of this however looks healthy. There is no surrounding infection 02/25/18; the area of its open is over the left ischial tuberosity. The base of this is where the wound is. This is a large inverted cone-shaped area with the wound at the tip. Dimensions of the wound at the tip are improved. There is a area of denuded skin about halfway towards the tip which her husband thinks may have happened today when he was bathing her. 04/20/17; the area is still open over the left initial tuberosity. 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 This is now completely closed over although it is closed over with a divot and skin over bone at the base of this. Continued aggressive  offloading will be necessary. 12/30/2018 READMISSION This is a 70 year old woman with chronic paraplegia. I picked her up for her care from Dr. Con Memos in this clinic after he departed. She had a stage IV pressure wound over the left ischial tuberosity. She was treated twice for her underlying osteomyelitis and this I believe firstly in 2016 and again in 2017. There were some plans at some point for flap closure of this however she was discovered to have uncontrolled diabetes and I do not think this was ever accomplished. She ultimately healed over in this clinic and was discharged in May. She has a large cone-shaped indentation with the tip of this going towards the left ischial tuberosity. It is not an easy area to examine but at that time I thought all of this was epithelialized. Apparently there was a reopening here shortly after she left the clinic last time. She was admitted to hospital at the end of June for Klebsiella bacteremia felt to be secondary to UTI. A CT scan of the pelvis is listed below and there was initially some concern that she had underlying osteomyelitis although I believe she was seen by infectious disease and that was felt to be not the case: I do not see any new cultures or inflammatory markers IMPRESSION: 1. No CT evidence for acute intra-abdominal or pelvic abnormality. Large volume of stool throughout the colon. 2. Enlarged fatty liver with fat sparing near the gallbladder fossa 3. Cortical scarring right kidney. Bilateral intrarenal stones without hydronephrosis. Thick-walled urinary bladder decompressed by suprapubic catheter 4. Deep left decubitus ulcer with underlying left ischial changes suggesting osteomyelitis. Her husband has been using silver collagen in the wound. She has not been systemically unwell no fever chills eating and drinking well. They rigorously offload this wound only getting up in the wheelchair when she is going to appointments the rest of the  time she is in bed. 10/8; wound measures larger and she now has exposed bone. We have been using silver alginate 11/12 still using silver alginate. The patient saw Dr. Megan Salon of infectious disease. She was started on Augmentin 500 mg twice daily. She is due to follow-up with Dr. Megan Salon I believe next week. Lab work Dr. Megan Salon requested showed a sedimentation rate of 28 and CRP of 20 although her CRP 1 year ago  was 18.8. Sedimentation rate 1 year ago was 11 basic metabolic panel showed a creatinine of 1.12 12/3; the patient followed up with Dr. Megan Salon yesterday. She is still on Augmentin twice daily. This was directed by Dr. Megan Salon. The patient's inflammatory markers have improved which is gratifying. Her C-reactive protein was repeated yesterday and follow-up booked with infectious disease in January. In addition I have been getting secure text messages I think from palliative care through [facility] [employer]. I think they were hoping to provide services to the patient in her home. They could not get a hold of the primary physician and so they reached out to me on 2 separate occasions. 12/17; patient last saw Dr. Megan Salon on 12/2. She is finishing up with Augmentin. Her C-reactive protein was 20 on 10/21, 10.1 on 11/19 and 17 on 12/2. The wound itself still has depth and undermining. We are using Santyl with the backing wet-to-dry 04/27/2019. The wound is gradually clearing up in terms of the surface although it is not filled in that much. Still abuts right against bone 2/4; patient with a deep pressure ulcer over the left ischial tuberosity. I thought she was going to follow-up with infectious disease to follow her inflammatory markers although the patient states that they stated that they did not need to see her unless we felt it was necessary. I will need to check their notes. In any case we ordered moistened silver collagen back with wet-to-dry to fill in the depth of  the wound although apparently [facility] sent silver alginate which they have been using since they were here the last time. Is obviously not what we ordered. 2/25. Not much change in this wound it is over the left ischial tuberosity recurrent wound. We have been using silver collagen with backing wet-to-dry. I think the wound is about the same. There is still some tunneling from about 10-12 o'clock over the ischial tuberosity itself 3/11; pressure ulcer over the left ischial tuberosity. Since she was last here the wound VAC was started and apparently going quite well. We are able to get the home health company that accepts [place] healthcare which is in itself sometimes problematic. There is been improvements in the wound the tunneling seems to be better and is contracted nicely 4/8; 1 month follow-up. Since she was last here we have been using silver collagen under a wound VAC. Some minor contraction I think in wound volume. She is cared for diligently by her husband including pressure relief, incontinence management, nutritional support etc. 6/1; this is almost a 26-monthfollow-up. She is been using silver collagen under wound VAC. Circular area over the left ischial tuberosity. She has been using silver collagen under wound VAC 7/8; 1 month follow-up. Silver collagen under the VAC not really a lot of progress. Tissue at the base of the wound which is right against bone and the tissue next that this does not look completely viable. She is not currently on any antibiotics, she had underlying osteomyelitis I need to look this over 8/16; we are using silver collagen under wound VAC to the left ischial tuberosity wound. Comes in today with absolutely no change in surface area or depth. There is no exposed bone. I did look over her infectious disease notes as I said I would do last time. She last saw Dr. CMegan Salonin December 2020. She completed 6 weeks of Augmentin. This was in response to a bone culture I  did showing methicillin susceptible staph aureus  and Enterococcus. She was supposed to come back to see Dr. Megan Salon at some point although they say that that appointment was canceled unless I chose to recommend return. I think there was supposed to be follow-up with inflammatory markers but I cannot see that that was ever done. She has not been on antibiotics since 9/21; monthly follow-up. We received a call from home health nurse last evening to report green drainage coming out of the wound. Lab work I ordered last time showed a white count of 5.2 a sedimentation rate of 45 and a C-reactive protein of 25 however neither one of the 2 values are substantially different from her previous values in October 2020 or December 2020. Both are slightly higher but only marginally. Otherwise no new complaints from the patient or her husband 10/19; 1 month follow-up. PCR culture I did last time showed medium quantities of Pseudomonas lower quantities Klebsiella and Enterococcus faecalis group B strep and Peptostreptococcus. I gave her Augmentin for 2 weeks. I am not really sure of my choice of this I would not cover Pseudomonas. She is still having green drainage. Wound itself looks satisfactory there is not a lot of depth wound bed looks healthy 11/16; patient has completed the antibiotics still using gentamicin and silver alginate on the wound. There is improvement in the surface area 12/21; in general the area on the buttock looks somewhat better. Surface looks healthy although I do not know that there is been much improvement in the wound volume. We have been using silver alginate and Hydrofera Blue. Less drainage. In passing the husband showed me an abrasion injury on the left anterior tibia. Covered in necrotic surface. He has noticed this for about a week and has been putting silver collagen on it. He is completely uncertain about how this happened 1/25; monthly follow-up. The area on the left buttock is  about the same. This does not go to bone but a fairly deep wound surface of the wound is of questionable viability. The abrasion injury that they showed me last time apparently was closed out by home health because they thought it was healed but certainly is not although it is just about healed. As a result they haven't been applying anything to this area Finally I did discuss with the patient and her husband the idea of an advanced treatment product to try and get a proper base to this wound I was thinking of Puraply however actually the patient points out that her co-pay for coming to visit [place] i.e. the facility, charge would be unaffordable if they have to, on a weekly basis 2/22; pressure ulcer on the left buttock appears deeper to me and abuts on the ischial tuberosity. I thought initially there was exposed bone but there is a rim of tissue over this area. She also has a superficial over the right anterior mid tibia. Been using silver collagen to these areas without much success. I have looked over the patient's past history with regards to the area on the left ischium. She did have underlying osteomyelitis here dating back I think to late 2020. She saw Dr. Megan Salon she received a 6-week course of oral antibiotics in response to a bone culture that I did. This does not appear to be infected but it certainly has not been improving in terms of granulation. I do not believe she has had any recent imaging studies 3/29; 1 month follow-up. Pressure ulcer on the left buttock which she has been dealing with with  for a number of years. She was treated for underlying osteomyelitis at 1.2 or 3 years ago I think with infectious disease help. She also had I think a flap closure by Dr. Leland Johns and that lasted for about a year and then reopened. I have not been able to get this patient to progress towards healing although truthfully the wound is absolutely no worse. We have been using Hydrofera Blue 4/26;  patient presents for 1 week follow-up. She has been using silver collagen to the area every other day. She has home health that comes out once a week to help with dressing changes as well. The patient is interested in trying a skin substitute over this area. She states she is trying to relieve pressure off of it most of the day. 5/24; patient presents for 1 month follow-up. She has been using silver collagen to the area every other day. She has no complaints today. She is interested in the skin substitute. She tries to leave relieve pressure off Her bottom however is not able to most of the day 6/14; using silver collagen to the area over the left ischial tuberosity. Wound does not appear to be doing particularly well. Open to bone 6/28; the patient patient presented last time with a marked deterioration. Depth probing all the way to bone. The bone itself did not look particularly viable. In spite of this the x-ray I did showed longstanding ulcer over the left ischial tuberosity with chronic bone involvement/reaction that was also seen by CT in 2020. Lab work did not show just active infection with a sed rate of 14 and a C-reactive protein of 7.1. Her comprehensive metabolic panel was normal including an albumin of 1.1 white count was 6 Electronic Signature(s) Signed: 10/02/2020 5:29:17 PM By: Linton Ham MD Signed: 10/02/2020 5:29:17 PM By: Linton Ham MD Entered By: Linton Ham on 10/02/2020 10:42:25 -------------------------------------------------------------------------------- Physical Exam Details Patient Name: Date of Service: [name]. 10/02/2020 10:00 A M Medical Record Number: [id number] Patient Account Number: [id number] Date of Birth/Sex: Treating RN: Sep 14, 1950 (70 y.o. Benjaman Lobe Primary Care Provider: [name] Other Clinician: Referring Provider: Treating Provider/Extender: Tommy Rainwater in Treatment: 59 Constitutional Patient is  hypertensive.. Pulse regular and within target range for patient.Marland Kitchen Respirations regular, non-labored and within target range.. Temperature is normal and within the target range for the patient.Marland Kitchen Appears in no distress. Notes Wound exam; not much changed from 2 weeks ago. The patient has a deep punched out area now with a wide swath of exposed left ischial tuberosity. The bone itself does not look particularly good although it does not seem terrible by palpation. No evidence of surrounding soft tissue infection. Electronic Signature(s) Signed: 10/02/2020 5:29:17 PM By: Linton Ham MD Entered By: Linton Ham on 10/02/2020 10:45:50 -------------------------------------------------------------------------------- Physician Orders Details Patient Name: Date of Service: [name], [name]. 10/02/2020 10:00 A M Medical Record Number: [id number] Patient Account Number: [id number] Date of Birth/Sex: Treating RN: 07/28/1950 (70 y.o. [name], [name] Primary Care Provider: [name] Other Clinician: Referring Provider: Treating Provider/Extender: Tommy Rainwater in Treatment: 33 Verbal / Phone Orders: No Diagnosis Coding Follow-up Appointments ppointment in 2 weeks. - Dr. Dellia Nims Return A Bathing/ Shower/ Hygiene May shower and wash wound with soap and water. - with dressingchanges Edema Control - Lymphedema / SCD / Other Elevate legs to the level of the heart or above for 30 minutes daily and/or when sitting, a frequency of: Avoid standing  for long periods of time. Off-Loading Turn and reposition every 2 hours Home Health Other Home Health Orders/Instructions: - [facility] Wound #12 - Ischial Tuberosity Wound Laterality: Left Cleanser: Soap and Water [facility]) Every Other Day/30 Days Discharge Instructions: May shower and wash wound with dial antibacterial soap and water prior to dressing change. Cleanser: Wound Cleanser [facility]) Every Other  Day/30 Days Discharge Instructions: Cleanse the wound with wound cleanser prior to applying a clean dressing using gauze sponges, not tissue or cotton balls. Peri-Wound Care: Skin Prep [facility]) Every Other Day/30 Days Discharge Instructions: Use skin prep as directed Prim Dressing: Promogran Prisma Matrix, 4.34 (sq in) (silver collagen) (Home Health) Every Other Day/30 Days ary Discharge Instructions: Moisten collagen with saline or hydrogel. Apply moistened gauze on top of prisma. Secondary Dressing: ComfortFoam Border, 4x4 in (silicone border) [facility]) Every Other Day/30 Days Discharge Instructions: Apply over primary dressing as directed. Radiology Computed Tomography (CT) Scan - pelvis; w/ and w/o contrast Electronic Signature(s) Signed: 10/02/2020 5:29:17 PM By: Linton Ham MD Signed: 10/02/2020 5:48:53 PM By: Rhae Hammock RN Entered By: Rhae Hammock on 10/02/2020 10:37:47 Prescription 10/02/2020 -------------------------------------------------------------------------------- Ackert, Molli Posey M. Linton Ham MD Patient Name: Provider: Jun 17, 1950 [id number] Date of Birth: NPI#: [id number] Sex: DEA #: [phone] Phone #: License #: [place] Patient Address: [address] [place], [place] [address] [place], [place] [address] [phone] Allergies Iodinated Contrast Media - IV Dye; aspirin; Sulfa (Sulfonamide Antibiotics) Provider's Orders Computed Tomography (CT) Scan - pelvis; w/ and w/o contrast Hand Signature: Date(s): Electronic Signature(s) Signed: 10/02/2020 5:29:17 PM By: Linton Ham MD Signed: 10/02/2020 5:48:53 PM By: Rhae Hammock RN Entered By: Rhae Hammock on 10/02/2020 10:37:47 -------------------------------------------------------------------------------- Problem List Details Patient Name: Date of Service: [name] Pulse M. 10/02/2020  10:00 [place] Record Number: [id number] Patient Account Number: [id number] Date of Birth/Sex: Treating RN: 1950/06/10 (70 y.o. [name], [name] Primary Care Provider: [name] Other Clinician: Referring Provider: Treating Provider/Extender: Tommy Rainwater in Treatment: 51 Active Problems ICD-10 Encounter Code Description Active Date MDM Diagnosis 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, stage 4 12/30/2018 No Yes E11.622 Type 2 diabetes mellitus with other skin ulcer 12/30/2018 No Yes Inactive Problems ICD-10 Code Description Active Date Inactive Date G82.20 Paraplegia, unspecified 12/30/2018 12/30/2018 M86.68 Other chronic osteomyelitis, other site 02/17/2019 02/17/2019 S80.811D Abrasion, right lower leg, subsequent encounter 03/27/2020 03/27/2020 L97.811 Non-pressure chronic ulcer of other part of right lower leg limited to breakdown of skin 03/27/2020 03/27/2020 Resolved Problems Electronic Signature(s) Signed: 10/02/2020 5:29:17 PM By: Linton Ham MD Entered By: Linton Ham on 10/02/2020 10:38:39 -------------------------------------------------------------------------------- Progress Note Details Patient Name: Date of Service: [name]. 10/02/2020 10:00 A M Medical Record Number: [id number] Patient Account Number: [id number] Date of Birth/Sex: Treating RN: [birth year] (70 y.o. [name], [name] Primary Care Provider: [name] Other Clinician: Referring Provider: Treating Provider/Extender: Tommy Rainwater in Treatment: 91 Subjective History of Present Illness (HPI) The following HPI elements were documented for the patient's wound: Location: open ulceration of the left gluteal area, left heel and right ankle for about 5 months. Quality: Patient reports No Pain. Severity: Patient states wound(s) are getting worse. Duration: Patient has had the wound for > 5 months prior to seeking treatment at the wound  center Context: The wound occurred when the patient has been paraplegic for about 3 years. Modifying Factors: Wound improving due  to current treatment. Associated Signs and Symptoms: Patient reports having foul odor. this 70 year old patient who is known to have hypertension, hypothyroidism, breast cancer, chronic pain syndrome, paraplegia was noted to have a left gluteal decubitus ulcer and was brought into the hospital. During the course of her hospitalization she was debrided in the operating room by ankle wound. Bone cultures were taken at that time but were negative but clinically she was treated for osteomyelitis because of the probing down to bone and open exposed bone. Home health has been giving her antibioticss which include vancomycin and Zosyn. The patient was a smoker until about 3 weeks ago and used to smoke about 10 cigarettes a day for a long while. 12/13/2014 - details of her operative note from 11/03/2014 were reviewed -- PROCEDURE: 1. Excisional debridement skin, subcutaneous, muscle left ischium 35 cm2 2. Excisional debridement skin, subcutaneous tissue left heel 27 cm2 3. Excisional debridement right ankle skin, subcutaneous, bone 30 cm2 01/24/2015 -- she has some issues with her wheelchair cushion but other than that is doing very well and has received Podus boots for her feet. 02/14/2015 -- she was using her old offloading boots and this seemed to have caused her a new pressure ulcer on the left posterior heel near the superior part just below the Achilles tendon. 03/07/2015 -- she has a new ulceration just to the left of the midline on her sacral region more on the left buttock and this has been there for Dr. Leland Johns and had all the wounds sharply debrided. The debridement was done for the left ischial wound, the left heel wound and the right about a week. 08/22/2015 -- was recently admitted to hospital between May 5 and 08/13/2015, with sepsis and leukocytosis due to a UTI.  she was treated for a sepsis complicating Escherichia coli UTI and kidney stones. She also had metabolic and careful up at the secondary to pyelonephritis. He received broad-spectrum antibiotics initially and then received Macrobid as per urology. She was sent home on nitrofurantoin. during her admission she had a CT scan which showed exposed left ischial tuberosity without evidence of osteolysis. 09/12/2015-- the patient is having some issues with her air mattress and would like to get a opinion from medical modalities. 10/10/2015 -- the issue with her air mattress has not yet been sorted out and the new problem seems to be a lot of odor from the wound VAC. 11/27/2015 -- the patient was admitted to the hospital between July 23 and 10/31/2015. Her problems were sepsis, osteomyelitis of the pelvic bone and acute pyelonephritis. CT of the abdomen and pelvis was consistent with a left-sided pyelonephritis with hydronephrosis and also just showed new sclerosis of the posterior portion of the left anterior pubic ramus suggestive of periosteal reaction consistent with osteomyelitis. She was treated for the osteomyelitis with infectious disease consult recommending 6 weeks of IV antibiotics including vancomycin and Rocephin and the antibiotics were to go on until 12/10/2015. He was seen by Dr. Iran Planas plastic surgery and Dr. Linus Salmons of infectious disease. She had a suprapubic catheter placed during the admission. CT scan done on 10/28/2015 showed specifically -- New sclerosis of the posterior portion of the left inferior pubic ramus with aggressive periosteal reaction, consistent with osteomyelitis, with adjacent soft tissue gas compatible with previously described decubitus ulcer. 12/12/2015 -- she was recently seen by Dr. Linus Salmons, who noted good improvement and CRP and ESR compared to before and he has stopped her antibiotic as per plans to finish on September  4. The patient was encouraged to continue with  wound care and consider hyperbaric oxygen therapy. Today she tells me that she has consented to undergo hyperbaric oxygen therapy and we can start the paperwork. 01/02/2016 -- her PCP had gained about 3 years but she still persists in having problems during hyperbaric oxygen therapy with some discomfort in the ears. 01/09/16; pressure area with underlying osteomyelitis in the left buttock. Wound bed itself has some slight amount of grayish surface slough however I do not think any debridement was necessary. There is no exposed bone soft tissue appears stable. She is using a wound VAC 01/16/16; back for weekly wound review in conjunction with HBO. She has a deep wound over the left initial tuberosity previously treated with 6 weeks of IV antibiotics for osteomyelitis. Wound bed looks reasonably healthy although the base of this is still precariously close to bone. She has been using a wound VAC. 01/23/2016 -- she has completed her course of antibiotics and this week the only new thing is her right great toe nail was avulsed and she has got an open wound over the nailbed. 01/31/16 she has completed her course of antibiotics. Her right great toenail avulsed last week and she's been using silver alginate for this as well. Still using a wound VAC to the substantial stage IV wound over the left ischial tuberosity 03/05/2016 -- the patient has had a opinion from the plastic surgery group at [facility] and details of this are not available yet but the patient's verbal report has been heard by me. Did not sound like there was any optimistic discussion regarding reconstruction and the net result would be to continue with the wound VAC application. I will await the official reports. Addendum: -- she was seen at [facility] by Dr. Tressa Busman. After a thorough review and from what I understand spending 45 minutes with the patient his assessment has been noted by me in  detail and the management options were: 1. Continued pressure offloading and wound care versus operative procedures including wound excision 2. Soft tissue and bone sampling 3. If the wound gets larger wound closure would be done using a variety of plastic surgical techniques including but not limited to skin substitute, possible skin graft, local versus regional flaps, negative pressure dressing application. 4. He discussed with her details of flap surgery and the risks associated 5. He made a comment that since the patient was operated on by Dr. Leland Johns of [facility] plastic surgery unit in [place] the patient may continue to follow-up there for further evaluation for surgical flap closure in the future. 03/19/2016 -- the patient continues to be rather depressed and frustrated with her lack of rapid progress in healing this wound especially because she thought after hyperbaric oxygen therapy the wound would heal extremely fast. She now understands that was not the implied benefit on wound care which was the recommendation for hyperbaric oxygen therapy. I have had a lengthy discussion with the patient and her husband regarding her options: 1. Continue with collagen and wound VAC for the primary dressing and offloading and all supportive care. 2. See Dr. Iran Planas for possible placement of Acell or Integra in the OR. 3. get a second opinion from a wound care center and surrounding regions/counties 05/07/2016 -- Note from Dr. Celedonio Miyamoto, who noted that the patient has declined flap surgery. She has discussed application of A cell, and try a few applications to see how the  wound progresses. She is also recommended that we could apply products here in the wound center, like Oasis. during her preop workup it was found that her hemoglobin A1c was 11% and she has now been diagnosed as having diabetes mellitus and has been put on appropriate treatment by her PCP 05/28/2016 -- tells me  her blood sugars have been doing well and she has an appointment to see her PCP in the next couple of weeks to check her hemoglobin A1c. Other than that she continues to do well. 06/25/2016 -- have not seen her back for the last month but she says her health has been about the same and she has an appointment to check the A1c next week 09/10/16 ---- was seen by Dr. Celedonio Miyamoto -- who applied Acell and saw her back in follow-up. She has recommended silver alginate to the wound every other day and cover with foam. If no significant drainage could transition to collagen every other day. She recommended discontinuing wound VAC. There were no plans to repeat application of Acell. The patient expressed that her husband could do the wound care as going to the Wound Ctr., would cost several $100 for each visit. 10/21/2016 -- her insurance company is getting her new mattress and she is pleased about that. Other than that she has been doing dressings with PolyMem Silver and has been doing very well 02/18/2017 -- she has gone through several changes of her mattress and has not been pleased with any of them. The ventricles are still working on trying to fit her with the appropriate low air-loss mattress. She has a new wound on the gluteal area which is clearly separated from the original wound. 03/25/17-she is here in follow-up evaluation for her left ischialpressure ulcer. She remains unsatisfied with her pressure mattress. She admits to sitting multiple hours a day, in the bed. We have discussed offloading options. The wound does not appear infected. Nutrition does not appear to be a concern. Will follow-up in 4 weeks, if wound continues to be stalled may consider x-ray to evaluate for refractory osteomyelitis. 04/21/17; this is a patient that I don't know all that well. She has a chronic wound which at one point had underlying osteomyelitis in the left ischial tuberosity. This is a stage IV pressure ulcer.  Over the last 3 months she has a stage II wound inferiorly to the original wound. The last time she was here her dressing was changed to silver collagen although the patient's husband who changes the dressing said that the collagen stuck to the wound and remove skin from the superficial area therefore he switched back to Gates 05/13/17; this is a patient we've been following for a left ischial tuberosity wound which was stage IV at one point had underlying osteomyelitis. Over the last several months she's had a stage II wound just inferior and medial to the related to the wound. According to her husband he is using Endoform layer with collagen although this is not what I had last time. According to her husband they are using Elgie Congo with collagen although I don't quite know how that started. She was hospitalized from 1/20 through 04/30/16. This was related to a UTI. Her blood cultures were negative, urine culture showed multiple species. She did have a CT scan of the abdomen and pelvis which documented chronic osteomyelitis in the area of the wound inflammatory markers were unremarkable. She has had prior knowledge of osteomyelitis. It looks as though she received IV  antibiotics in 2017 and was treated with a course of hyperbaric oxygen. 05/28/17; the wound over the left ischial tuberosity is deeper today and abuts clearly on bone. Nursing intake reported drainage. I therefore culture of the wound. The more superficial area just below this looks about the same. They once again complained that there are mattress cover is not working although apparently [facility] is been noted to see this many times in the report is that the device is functional 06/18/17; the patient had a probing area on the left ischial tuberosity that was draining purulent fluid last time. This also clearly seemed to have open bone. Culture I did showed pansensitive pseudomonas including third generation cephalosporins.  I treated this with cefdinir 300 twice a day for 10 days and things seem to have improved. She has a more superficial wound just underneath this area. Amazingly she has a new air mattress through [facility]. I think they gave this to her as a parking give. In any case this now works according to the patient may have something to do with why the areas are looking better. 07/09/17; the patient has a probing area in the left ischial tuberosity that still has some depth. However this is contracted in terms of the wound orifice although the depth is still roughly the same. There is no undermining. ooShe also has the satellite wound which is more superficial. This appears to have a healthy surface we've been using silver collagen 08/06/17; the patient's wound is over the left ischial tuberosity and a satellite lesion just underneath this. The original wound was actually a deep stage 4 wound. We have made good progress in 2 months and there is no longer exposed bone here. 09/03/17; left ischial tuberosity actually appears to be quite healthy. I think we are making progress. No debridement is required. There is no surrounding erythema 10/01/17 I follow this patient monthly for her left ischial tuberosity wound. There is 2 areas the original area and a satellite area. The satellite area looks a lot better there is no surrounding erythema. Her husband relates that he is having trouble maintaining the dressing. This has to do with the soft tissue around it. He states he puts the collagen in but he cannot make sure that it stays in even with the ABD pads and tape that he is been using 10/29/17; patient arrives with a better looking noon today. Some of the satellite lesions have closed. using Prisma 11/26/17; the patient has a large cone-shaped area with the tip of the Cone deep within her buttock soft tissue. The walls of the Cone are epithelialized however the base is still open. The area at the base of this  looks moist we've been using silver collagen. Will change to silver alginate 12/31/2017; the wound appears to have come in fairly nicely. Using silver alginate. There is no surrounding maceration or infection 01/28/18; there is still an open area here over the left initial tuberosity. Base of this however looks healthy. There is no surrounding infection 02/25/18; the area of its open is over the left ischial tuberosity. The base of this is where the wound is. This is a large inverted cone-shaped area with the wound at the tip. Dimensions of the wound at the tip are improved. There is a area of denuded skin about halfway towards the tip which her husband thinks may have happened today when he was bathing her. 04/20/17; the area is still open over the left initial tuberosity. This  is an cone shaped wound with the tip where the wound remains area there is no evidence of infection, no erythema and no purulent drainage 5/12; very fragile patient who had a chronic stage IV wound over the left ischial tuberosity. This is now completely closed over although it is closed over with a divot and skin over bone at the base of this. Continued aggressive offloading will be necessary. 12/30/2018 READMISSION This is a 70 year old woman with chronic paraplegia. I picked her up for her care from Dr. Con Memos in this clinic after he departed. She had a stage IV pressure wound over the left ischial tuberosity. She was treated twice for her underlying osteomyelitis and this I believe firstly in 2016 and again in 2017. There were some plans at some point for flap closure of this however she was discovered to have uncontrolled diabetes and I do not think this was ever accomplished. She ultimately healed over in this clinic and was discharged in May. She has a large cone-shaped indentation with the tip of this going towards the left ischial tuberosity. It is not an easy area to examine but at that time I thought all of this was  epithelialized. Apparently there was a reopening here shortly after she left the clinic last time. She was admitted to hospital at the end of June for Klebsiella bacteremia felt to be secondary to UTI. A CT scan of the pelvis is listed below and there was initially some concern that she had underlying osteomyelitis although I believe she was seen by infectious disease and that was felt to be not the case: I do not see any new cultures or inflammatory markers IMPRESSION: 1. No CT evidence for acute intra-abdominal or pelvic abnormality. Large volume of stool throughout the colon. 2. Enlarged fatty liver with fat sparing near the gallbladder fossa 3. Cortical scarring right kidney. Bilateral intrarenal stones without hydronephrosis. Thick-walled urinary bladder decompressed by suprapubic catheter 4. Deep left decubitus ulcer with underlying left ischial changes suggesting osteomyelitis. Her husband has been using silver collagen in the wound. She has not been systemically unwell no fever chills eating and drinking well. They rigorously offload this wound only getting up in the wheelchair when she is going to appointments the rest of the time she is in bed. 10/8; wound measures larger and she now has exposed bone. We have been using silver alginate 11/12 still using silver alginate. The patient saw Dr. Megan Salon of infectious disease. She was started on Augmentin 500 mg twice daily. She is due to follow-up with Dr. Megan Salon I believe next week. Lab work Dr. Megan Salon requested showed a sedimentation rate of 28 and CRP of 20 although her CRP 1 year ago was 18.8. Sedimentation rate 1 year ago was 11 basic metabolic panel showed a creatinine of 1.12 12/3; the patient followed up with Dr. Megan Salon yesterday. She is still on Augmentin twice daily. This was directed by Dr. Megan Salon. The patient's inflammatory markers have improved which is gratifying. Her C-reactive protein was repeated yesterday and  follow-up booked with infectious disease in January. In addition I have been getting secure text messages I think from palliative care [facility] [employer]. I think they were hoping to provide services to the patient in her home. They could not get a hold of the primary physician and so they reached out to me on 2 separate occasions. 12/17; patient last saw Dr. Megan Salon on 12/2. She is finishing up with Augmentin. Her C-reactive protein  was 20 on 10/21, 10.1 on 11/19 and 17 on 12/2. The wound itself still has depth and undermining. We are using Santyl with the backing wet-to-dry 04/27/2019. The wound is gradually clearing up in terms of the surface although it is not filled in that much. Still abuts right against bone 2/4; patient with a deep pressure ulcer over the left ischial tuberosity. I thought she was going to follow-up with infectious disease to follow her inflammatory markers although the patient states that they stated that they did not need to see her unless we felt it was necessary. I will need to check their notes. In any case we ordered moistened silver collagen back with wet-to-dry to fill in the depth of the wound although apparently [facility] sent silver alginate which they have been using since they were here the last time. Is obviously not what we ordered. 2/25. Not much change in this wound it is over the left ischial tuberosity recurrent wound. We have been using silver collagen with backing wet-to-dry. I think the wound is about the same. There is still some tunneling from about 10-12 o'clock over the ischial tuberosity itself 3/11; pressure ulcer over the left ischial tuberosity. Since she was last here the wound VAC was started and apparently going quite well. We are able to get the home health company that accepts [place] healthcare which is in itself sometimes problematic. There is been improvements in the wound the tunneling seems to be better and is  contracted nicely 4/8; 1 month follow-up. Since she was last here we have been using silver collagen under a wound VAC. Some minor contraction I think in wound volume. She is cared for diligently by her husband including pressure relief, incontinence management, nutritional support etc. 6/1; this is almost a 70-month follow-up. She is been using silver collagen under wound VAC. Circular area over the left ischial tuberosity. She has been using silver collagen under wound VAC 7/8; 1 month follow-up. Silver collagen under the VAC not really a lot of progress. Tissue at the base of the wound which is right against bone and the tissue next that this does not look completely viable. She is not currently on any antibiotics, she had underlying osteomyelitis I need to look this over 8/16; we are using silver collagen under wound VAC to the left ischial tuberosity wound. Comes in today with absolutely no change in surface area or depth. There is no exposed bone. I did look over her infectious disease notes as I said I would do last time. She last saw Dr. Megan Salon in December 2020. She completed 6 weeks of Augmentin. This was in response to a bone culture I did showing methicillin susceptible staph aureus and Enterococcus. She was supposed to come back to see Dr. Megan Salon at some point although they say that that appointment was canceled unless I chose to recommend return. I think there was supposed to be follow-up with inflammatory markers but I cannot see that that was ever done. She has not been on antibiotics since 9/21; monthly follow-up. We received a call from home health nurse last evening to report green drainage coming out of the wound. Lab work I ordered last time showed a white count of 5.2 a sedimentation rate of 45 and a C-reactive protein of 25 however neither one of the 2 values are substantially different from her previous values in October 2020 or December 2020. Both are slightly higher but only  marginally. Otherwise no new complaints from the patient  or her husband 10/19; 1 month follow-up. PCR culture I did last time showed medium quantities of Pseudomonas lower quantities Klebsiella and Enterococcus faecalis group B strep and Peptostreptococcus. I gave her Augmentin for 2 weeks. I am not really sure of my choice of this I would not cover Pseudomonas. She is still having green drainage. Wound itself looks satisfactory there is not a lot of depth wound bed looks healthy 11/16; patient has completed the antibiotics still using gentamicin and silver alginate on the wound. There is improvement in the surface area 12/21; in general the area on the buttock looks somewhat better. Surface looks healthy although I do not know that there is been much improvement in the wound volume. We have been using silver alginate and Hydrofera Blue. Less drainage. In passing the husband showed me an abrasion injury on the left anterior tibia. Covered in necrotic surface. He has noticed this for about a week and has been putting silver collagen on it. He is completely uncertain about how this happened 1/25; monthly follow-up. The area on the left buttock is about the same. This does not go to bone but a fairly deep wound surface of the wound is of questionable viability. The abrasion injury that they showed me last time apparently was closed out by home health because they thought it was healed but certainly is not although it is just about healed. As a result they haven't been applying anything to this area Finally I did discuss with the patient and her husband the idea of an advanced treatment product to try and get a proper base to this wound I was thinking of Puraply however actually the patient points out that her co-pay for coming to visit [place].e. the facility, charge would be unaffordable if they have to, on a weekly basis 2/22; pressure ulcer on the left buttock appears deeper to me and abuts on the  ischial tuberosity. I thought initially there was exposed bone but there is a rim of tissue over this area. She also has a superficial over the right anterior mid tibia. Been using silver collagen to these areas without much success. I have looked over the patient's past history with regards to the area on the left ischium. She did have underlying osteomyelitis here dating back I think to late 2020. She saw Dr. Megan Salon she received a 6-week course of oral antibiotics in response to a bone culture that I did. This does not appear to be infected but it certainly has not been improving in terms of granulation. I do not believe she has had any recent imaging studies 3/29; 1 month follow-up. Pressure ulcer on the left buttock which she has been dealing with with for a number of years. She was treated for underlying osteomyelitis at 1.2 or 3 years ago I think with infectious disease help. She also had I think a flap closure by Dr. Leland Johns and that lasted for about a year and then reopened. I have not been able to get this patient to progress towards healing although truthfully the wound is absolutely no worse. We have been using Hydrofera Blue 4/26; patient presents for 1 week follow-up. She has been using silver collagen to the area every other day. She has home health that comes out once a week to help with dressing changes as well. The patient is interested in trying a skin substitute over this area. She states she is trying to relieve pressure off of it most of the day. 5/24;  patient presents for 1 month follow-up. She has been using silver collagen to the area every other day. She has no complaints today. She is interested in the skin substitute. She tries to leave relieve pressure off Her bottom however is not able to most of the day 6/14; using silver collagen to the area over the left ischial tuberosity. Wound does not appear to be doing particularly well. Open to bone 6/28; the patient patient  presented last time with a marked deterioration. Depth probing all the way to bone. The bone itself did not look particularly viable. In spite of this the x-ray I did showed longstanding ulcer over the left ischial tuberosity with chronic bone involvement/reaction that was also seen by CT in 2020. Lab work did not show just active infection with a sed rate of 14 and a C-reactive protein of 7.1. Her comprehensive metabolic panel was normal including an albumin of 1.1 white count was 6 Objective Constitutional Patient is hypertensive.. Pulse regular and within target range for patient.Marland Kitchen Respirations regular, non-labored and within target range.. Temperature is normal and within the target range for the patient.Marland Kitchen Appears in no distress. Vitals Time Taken: 10:05 AM, Height: 63 in, Weight: 185 lbs, BMI: 32.8, Temperature: 98.0 F, Pulse: 88 bpm, Respiratory Rate: 16 breaths/min, Blood Pressure: 160/80 mmHg, Pulse Oximetry: 93 %. General Notes: Wound exam; not much changed from 2 weeks ago. The patient has a deep punched out area now with a wide swath of exposed left ischial tuberosity. The bone itself does not look particularly good although it does not seem terrible by palpation. No evidence of surrounding soft tissue infection. Integumentary (Hair, Skin) Wound #12 status is Open. Original cause of wound was Pressure Injury. The date acquired was: 09/06/2018. The wound has been in treatment 91 weeks. The wound is located on the Left Ischial Tuberosity. The wound measures 3.2cm length x 0.6cm width x 0.8cm depth; 1.508cm^2 area and 1.206cm^3 volume. There is Fat Layer (Subcutaneous Tissue) exposed. There is no tunneling or undermining noted. There is a medium amount of serosanguineous drainage noted. The wound margin is well defined and not attached to the wound base. There is medium (34-66%) red, pink granulation within the wound bed. There is a medium (34- 66%) amount of necrotic tissue within the wound  bed including Adherent Slough. 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 - Dr. Dellia Nims Bathing/ Shower/ Hygiene: May shower and wash wound with soap and water. - with dressingchanges Edema Control - Lymphedema / SCD / Other: Elevate legs to the level of the heart or above for 30 minutes daily and/or when sitting, a frequency of: Avoid standing for long periods of time. Off-Loading: Turn and reposition every 2 hours Home Health: Other Home Health Orders/Instructions: - [facility] ordered were: Computed T omography (CT) Scan - pelvis; w/ and w/o contrast WOUND #12: - Ischial Tuberosity Wound Laterality: Left Cleanser: Soap and Water [facility]) Every Other Day/30 Days Discharge Instructions: May shower and wash wound with dial antibacterial soap and water prior to dressing change. Cleanser: Wound Cleanser [facility]) Every Other Day/30 Days Discharge Instructions: Cleanse the wound with wound cleanser prior to applying a clean dressing using gauze sponges, not tissue or cotton balls. Peri-Wound Care: Skin Prep [facility]) Every Other Day/30 Days Discharge Instructions: Use skin prep as directed Prim Dressing: Promogran Prisma Matrix, 4.34 (sq in) (silver collagen) (  Home Health) Every Other Day/30 Days ary Discharge Instructions: Moisten collagen with saline or hydrogel. Apply moistened gauze on top of prisma. Secondary Dressing: ComfortFoam Border, 4x4 in (silicone border) [facility]) Every Other Day/30 Days Discharge Instructions: Apply over primary dressing as directed. #1 I continued with silver collagen 2. I have repeated her CT scan with contrast to compare with the one in 2020 3. I had some thoughts today about obtaining bone for pathology and culture however we did not seem to have the supplies in order. That is probably  going to be necessary Electronic Signature(s) Signed: 10/02/2020 5:29:17 PM By: Linton Ham MD Entered By: Linton Ham on 10/02/2020 10:46:47 -------------------------------------------------------------------------------- SuperBill Details Patient Name: Date of Service: [name] 10/02/2020 Medical Record Number: [id number] Patient Account Number: [id number] Date of Birth/Sex: Treating RN: December 28, 1950 (70 y.o. [name], [name] Primary Care Provider: [name] Other Clinician: Referring Provider: Treating Provider/Extender: Tommy Rainwater in Treatment: 91 Diagnosis Coding ICD-10 Codes Code Description [phone] Pressure ulcer of left buttock, stage 4 E11.622 Type 2 diabetes mellitus with other skin ulcer Facility Procedures The patient participates with Medicare or their insurance follows the Medicare Facility Guidelines: CPT4 Code Description Modifier Quantity [id number] [place] VISIT-LEV 3 EST PT 1 Physician Procedures Electronic Signature(s) Signed: 10/02/2020 5:29:17 PM By: Linton Ham MD Entered By: Linton Ham on 10/02/2020 10:47:48

## 2020-10-10 ENCOUNTER — Encounter (HOSPITAL_COMMUNITY): Payer: Self-pay

## 2020-10-10 ENCOUNTER — Ambulatory Visit (HOSPITAL_COMMUNITY): Admission: RE | Admit: 2020-10-10 | Payer: Medicare Other | Source: Ambulatory Visit

## 2020-10-16 ENCOUNTER — Encounter (HOSPITAL_BASED_OUTPATIENT_CLINIC_OR_DEPARTMENT_OTHER): Payer: Medicare Other | Attending: Internal Medicine | Admitting: Internal Medicine

## 2020-10-25 ENCOUNTER — Encounter (HOSPITAL_BASED_OUTPATIENT_CLINIC_OR_DEPARTMENT_OTHER): Payer: Medicare Other | Attending: Internal Medicine | Admitting: Internal Medicine

## 2020-10-25 ENCOUNTER — Other Ambulatory Visit: Payer: Self-pay

## 2020-10-25 ENCOUNTER — Other Ambulatory Visit (HOSPITAL_COMMUNITY)
Admission: RE | Admit: 2020-10-25 | Discharge: 2020-10-25 | Disposition: A | Discharge status: Home / Self Care | Payer: Medicare Other | Source: Other Acute Inpatient Hospital | Attending: Internal Medicine | Admitting: Internal Medicine

## 2020-10-25 ENCOUNTER — Other Ambulatory Visit (HOSPITAL_BASED_OUTPATIENT_CLINIC_OR_DEPARTMENT_OTHER): Payer: Self-pay | Admitting: Internal Medicine

## 2020-10-25 DIAGNOSIS — L89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, stage 4: Secondary | ICD-10-CM | POA: Diagnosis not present

## 2020-10-25 DIAGNOSIS — Z87891 Personal history of nicotine dependence: Secondary | ICD-10-CM | POA: Insufficient documentation

## 2020-10-25 DIAGNOSIS — G8220 Paraplegia, unspecified: Secondary | ICD-10-CM | POA: Insufficient documentation

## 2020-10-25 DIAGNOSIS — E119 Type 2 diabetes mellitus without complications: Secondary | ICD-10-CM | POA: Diagnosis not present

## 2020-10-25 NOTE — Progress Notes (Signed)
[name], [name] ([id number]) Visit Report for 10/02/2020 Arrival Information Details Patient Name: Date of Service: [name] 10/02/2020 10:00 [place] Record Number: [id number] Patient Account Number: [id number] Date of Birth/Sex: Treating RN: [birth year] (70 y.o. [name], Meta.Reding Primary Care [name]: [name] Other Clinician: Referring [name]: Treating [name]/Extender: Tommy Rainwater in Treatment: 73 Visit Information History Since Last Visit All ordered tests and consults were completed: No Patient Arrived: Wheel Chair Added or deleted any medications: No Arrival Time: 10:00 Any new allergies or adverse reactions: No Accompanied By: spouse Had a fall or experienced change in No Transfer Assistance: EasyPivot Patient Lift activities of daily living that may affect Patient Identification Verified: Yes risk of falls: Secondary Verification Process Completed: Yes Signs or symptoms of abuse/neglect since last visito No Patient Requires Transmission-Based Precautions: No Hospitalized since last visit: No Patient Has Alerts: No Implantable device outside of the clinic excluding No cellular tissue based products placed in the center since last visit: Has Dressing in Place as Prescribed: Yes Pain Present Now: No Electronic Signature(s) Signed: 10/25/2020 9:25:46 PM By: Deon Pilling Previous Signature: 10/22/2020 8:50:03 PM Version By: Deon Pilling Entered By: Deon Pilling on 10/25/2020 21:24:53 -------------------------------------------------------------------------------- Clinic Level of Care Assessment Details Patient Name: Date of Service: [name] 10/02/2020 10:00 A M Medical Record Number: [id number] Patient Account Number: [id number] Date of Birth/Sex: Treating RN: [birth year] (70 y.o. [name], [name] Primary Care [name]: [name] Other Clinician: Referring [name]: Treating [name]/Extender: Tommy Rainwater in Treatment: 91 Clinic Level of Care Assessment Items TOOL 4 Quantity Score X- 1 0 Use when only an EandM is performed on FOLLOW-UP visit ASSESSMENTS - Nursing Assessment / Reassessment X- 1 10 Reassessment of Co-morbidities (includes updates in patient status) X- 1 5 Reassessment of Adherence to Treatment Plan ASSESSMENTS - Wound and Skin A ssessment / Reassessment []  - 0 Simple Wound Assessment / Reassessment - one wound X- 1 5 Complex Wound Assessment / Reassessment - multiple wounds []  - 0 Dermatologic / Skin Assessment (not related to wound area) ASSESSMENTS - Focused Assessment []  - 0 Circumferential Edema Measurements - multi extremities []  - 0 Nutritional Assessment / Counseling / Intervention []  - 0 Lower Extremity Assessment (monofilament, tuning fork, pulses) []  - 0 Peripheral Arterial Disease Assessment (using hand held doppler) ASSESSMENTS - Ostomy and/or Continence Assessment and Care []  - 0 Incontinence Assessment and Management []  - 0 Ostomy Care Assessment and Management (repouching, etc.) PROCESS - Coordination of Care []  - 0 Simple Patient / Family Education for ongoing care X- 1 20 Complex (extensive) Patient / Family Education for ongoing care X- 1 10 Staff obtains Programmer, systems, Records, T Results / Process Orders est X- 1 10 Staff telephones HHA, Nursing Homes / Clarify orders / etc []  - 0 Routine Transfer to another Facility (non-emergent condition) []  - 0 Routine Hospital Admission (non-emergent condition) []  - 0 New Admissions / Biomedical engineer / Ordering NPWT Apligraf, etc. , []  - 0 Emergency Hospital Admission (emergent condition) X- 1 10 Simple Discharge Coordination []  - 0 Complex (extensive) Discharge Coordination PROCESS - Special Needs []  - 0 Pediatric / Minor Patient Management []  - 0 Isolation Patient Management []  - 0 Hearing / Language / Visual special needs []  - 0 Assessment of Community assistance  (transportation, D/C planning, etc.) []  - 0 Additional assistance / Altered mentation []  - 0 Support Surface(s) Assessment (bed, cushion, seat, etc.) INTERVENTIONS - Wound Cleansing /  Measurement []  - 0 Simple Wound Cleansing - one wound X- 1 5 Complex Wound Cleansing - multiple wounds X- 1 5 Wound Imaging (photographs - any number of wounds) []  - 0 Wound Tracing (instead of photographs) []  - 0 Simple Wound Measurement - one wound X- 1 5 Complex Wound Measurement - multiple wounds INTERVENTIONS - Wound Dressings []  - 0 Small Wound Dressing one or multiple wounds X- 1 15 Medium Wound Dressing one or multiple wounds []  - 0 Large Wound Dressing one or multiple wounds []  - 0 Application of Medications - topical []  - 0 Application of Medications - injection INTERVENTIONS - Miscellaneous []  - 0 External ear exam []  - 0 Specimen Collection (cultures, biopsies, blood, body fluids, etc.) []  - 0 Specimen(s) / Culture(s) sent or taken to Lab for analysis []  - 0 Patient Transfer (multiple staff / Civil Service fast streamer / Similar devices) []  - 0 Simple Staple / Suture removal (25 or less) []  - 0 Complex Staple / Suture removal (26 or more) []  - 0 Hypo / Hyperglycemic Management (close monitor of Blood Glucose) []  - 0 Ankle / Brachial Index (ABI) - do not check if billed separately X- 1 5 Vital Signs Has the patient been seen at the hospital within the last three years: Yes Total Score: 105 Level Of Care: New/Established - Level 3 Electronic Signature(s) Signed: 10/02/2020 5:48:53 PM By: Rhae Hammock RN Entered By: Rhae Hammock on 10/02/2020 10:40:02 -------------------------------------------------------------------------------- Encounter Discharge Information Details Patient Name: Date of Service: [name], [name] Posey M. 10/02/2020 10:00 A M Medical Record Number: [id number] Patient Account Number: [id number] Date of Birth/Sex: Treating RN: 1950-05-15 (70 y.o. [name] Primary Care Milan Perkins: [name] Other Clinician: Referring [name]: Treating [name]/Extender: Tommy Rainwater in Treatment: 58 Encounter Discharge Information Items Discharge Condition: Stable Ambulatory Status: Wheelchair Discharge Destination: Home Transportation: Private Auto Accompanied ByRaechel Chute Schedule Follow-up Appointment: Yes Clinical Summary of Care: Provided on 10/02/2020 Form Type Recipient Paper Patient Patient Electronic Signature(s) Signed: 10/02/2020 10:52:13 AM By: Lorrin Jackson Entered By: Lorrin Jackson on 10/02/2020 10:52:12 -------------------------------------------------------------------------------- Lower Extremity Assessment Details Patient Name: Date of Service: [name]. 10/02/2020 10:00 A M Medical Record Number: [id number] Patient Account Number: [id number] Date of Birth/Sex: Treating RN: [birth year] (70 y.o. [name], [name] Primary Care Jaymien Landin: [name] Other Clinician: Referring Raciel Caffrey: Treating Dinero Chavira/Extender: Tommy Rainwater in Treatment: 62 Electronic Signature(s) Signed: 10/25/2020 9:25:46 PM By: Deon Pilling Previous Signature: 10/22/2020 8:50:03 PM Version By: Deon Pilling Entered By: Deon Pilling on 10/25/2020 21:25:11 -------------------------------------------------------------------------------- Multi Wound Chart Details Patient Name: Date of Service: [name], [name] Breach. 10/02/2020 10:00 [place] Record Number: [id number] Patient Account Number: [id number] Date of Birth/Sex: Treating RN: Mar 22, 1951 (70 y.o. [name], [name] Primary Care Marvell Tamer: [name] Other Clinician: Referring Muhammadali Ries: Treating Jazalynn Mireles/Extender: Tommy Rainwater in Treatment: 91 Vital Signs Height(in): 63 Pulse(bpm): 4 Weight(lbs): 185 Blood Pressure(mmHg): 160/80 Body Mass Index(BMI): 33 Temperature(F): 98.0 Respiratory Rate(breaths/min): 16 Photos: [12:No Photos  Left Ischial Tuberosity] [N/A:N/A N/A] Wound Location: [12:Pressure Injury] [N/A:N/A] Wounding Event: [12:Pressure Ulcer] [N/A:N/A] Primary Etiology: [12:Anemia, Hypertension, Type II] [N/A:N/A] Comorbid History: [12:Diabetes, Osteoarthritis, Dementia, Paraplegia, Received Radiation 09/06/2018] [N/A:N/A] Date Acquired: [12:91] [N/A:N/A] Weeks of Treatment: [12:Open] [N/A:N/A] Wound Status: [12:3.2x0.6x0.8] [N/A:N/A] Measurements L x W x D (cm) [12:1.508] [N/A:N/A] A (cm) : rea [12:1.206] [N/A:N/A] Volume (cm) : [12:-539.00%] [N/A:N/A] % Reduction in A rea: [12:-468.90%] [N/A:N/A] % Reduction in Volume: [12:Category/Stage IV] [N/A:N/A] Classification: [  12:Medium] [N/A:N/A] Exudate A mount: [12:Serosanguineous] [N/A:N/A] Exudate Type: [12:red, brown] [N/A:N/A] Exudate Color: [12:Well defined, not attached] [N/A:N/A] Wound Margin: [12:Medium (34-66%)] [N/A:N/A] Granulation A mount: [12:Red, Pink] [N/A:N/A] Granulation Quality: [12:Medium (34-66%)] [N/A:N/A] Necrotic A mount: [12:Fat Layer (Subcutaneous Tissue): Yes N/A] Exposed Structures: [12:Fascia: No Tendon: No Muscle: No Joint: No Bone: No Small (1-33%)] [N/A:N/A] Treatment Notes Electronic Signature(s) Signed: 10/02/2020 5:29:17 PM By: Linton Ham MD Signed: 10/02/2020 5:48:53 PM By: Rhae Hammock RN Entered By: Linton Ham on 10/02/2020 10:38:49 -------------------------------------------------------------------------------- Multi-Disciplinary Care Plan Details Patient Name: Date of Service: [name], [name] Posey M. 10/02/2020 10:00 A M Medical Record Number: [id number] Patient Account Number: [id number] Date of Birth/Sex: Treating RN: [birth year] (70 y.o. [name], [name] Primary Care Yoko Mcgahee: [name] Other Clinician: Referring Zarra Geffert: Treating Maricruz Lucero/Extender: Tommy Rainwater in Treatment: 53 Active Inactive Wound/Skin Impairment Nursing Diagnoses: Knowledge deficit related to  ulceration/compromised skin integrity Goals: Patient/caregiver will verbalize understanding of skin care regimen Date Initiated: 12/30/2018 Target Resolution Date: 10/06/2020 Goal Status: Active Interventions: Assess patient/caregiver ability to obtain necessary supplies Assess patient/caregiver ability to perform ulcer/skin care regimen upon admission and as needed Assess ulceration(s) every visit Provide education on ulcer and skin care Treatment Activities: Skin care regimen initiated : 12/30/2018 Topical wound management initiated : 12/30/2018 Notes: Electronic Signature(s) Signed: 10/02/2020 5:48:53 PM By: Rhae Hammock RN Entered By: Rhae Hammock on 10/02/2020 10:33:12 -------------------------------------------------------------------------------- Pain Assessment Details Patient Name: Date of Service: [name]. 10/02/2020 10:00 A M Medical Record Number: [id number] Patient Account Number: [id number] Date of Birth/Sex: Treating RN: 1950/07/03 (70 y.o. [name] Primary Care Kathyleen Radice: [name] Other Clinician: Referring Alexus Michael: Treating Pascale Maves/Extender: Tommy Rainwater in Treatment: 22 Active Problems Location of Pain Severity and Description of Pain Patient Has Paino No Site Locations Rate the pain. Current Pain Level: 0 Pain Management and Medication Current Pain Management: Electronic Signature(s) Signed: 10/25/2020 9:25:46 PM By: Deon Pilling Previous Signature: 10/22/2020 8:50:03 PM Version By: Deon Pilling Entered By: Deon Pilling on 10/25/2020 21:25:04 -------------------------------------------------------------------------------- Patient/Caregiver Education Details Patient Name: Date of Service: [name] 6/28/2022andnbsp10:00 [place] Record Number: [id number] Patient Account Number: [id number] Date of Birth/Gender: Treating RN: 02/16/1951 (70 y.o. [name], [name] Primary Care Physician: [name] Other Clinician: Referring Physician: Treating Physician/Extender: Tommy Rainwater in Treatment: 27 Education Assessment Education Provided To: Patient Education Topics Provided Wound/Skin Impairment: Methods: Explain/Verbal Responses: State content correctly [employer]) Signed: 10/02/2020 5:48:53 PM By: Rhae Hammock RN Entered By: Rhae Hammock on 10/02/2020 10:39:17 -------------------------------------------------------------------------------- Wound Assessment Details Patient Name: Date of Service: [name]. 10/02/2020 10:00 [place] Record Number: [id number] Patient Account Number: [id number] Date of Birth/Sex: Treating RN: 1950/08/03 (70 y.o. [name], [name] Primary Care Demitri Kucinski: [name] Other Clinician: Referring Cady Hafen: Treating Navah Grondin/Extender: Tommy Rainwater in Treatment: 91 Wound Status Wound Number: 12 Primary Pressure Ulcer Etiology: Wound Location: Left Ischial Tuberosity Wound Open Wounding Event: Pressure Injury Status: Date Acquired: 09/06/2018 Comorbid Anemia, Hypertension, Type II Diabetes, Osteoarthritis, Weeks Of Treatment: 91 History: Dementia, Paraplegia, Received Radiation Clustered Wound: No Photos Wound Measurements Length: (cm) 3.2 Width: (cm) 0.6 Depth: (cm) 0.8 Area: (cm) 1.508 Volume: (cm) 1.206 % Reduction in Area: -539% % Reduction in Volume: -468.9% Epithelialization: Small (1-33%) Tunneling: No Undermining: No Wound Description Classification: Category/Stage IV Wound Margin: Well defined, not attached Exudate Amount: Medium Exudate Type: Serosanguineous Exudate Color: red, brown Foul Odor After Cleansing:  No Slough/Fibrino Yes Wound Bed Granulation Amount: Medium (34-66%) Exposed Structure Granulation Quality: Red, Pink Fascia Exposed: No Necrotic Amount: Medium (34-66%) Fat Layer (Subcutaneous Tissue) Exposed: Yes Necrotic Quality: Adherent  Slough Tendon Exposed: No Muscle Exposed: No Joint Exposed: No Bone Exposed: No Treatment Notes Wound #12 (Ischial Tuberosity) Wound Laterality: Left Cleanser Soap and Water Discharge Instruction: May shower and wash wound with dial antibacterial soap and water prior to dressing change. Wound Cleanser Discharge Instruction: Cleanse the wound with wound cleanser prior to applying a clean dressing using gauze sponges, not tissue or cotton balls. Peri-Wound Care Skin Prep Discharge Instruction: Use skin prep as directed Topical Primary Dressing Promogran Prisma Matrix, 4.34 (sq in) (silver collagen) Discharge Instruction: Moisten collagen with saline or hydrogel. Apply moistened gauze on top of prisma. Secondary Dressing ComfortFoam Border, 4x4 in (silicone border) Discharge Instruction: Apply over primary dressing as directed. Secured With Compression Wrap Compression Stockings Environmental education officer) Signed: 10/25/2020 9:25:46 PM By: Deon Pilling Previous Signature: 10/03/2020 10:48:43 AM Version By: Sandre Kitty Previous Signature: 10/22/2020 8:50:03 PM Version By: Deon Pilling Entered By: Deon Pilling on 10/25/2020 21:25:23 -------------------------------------------------------------------------------- Vitals Details Patient Name: Date of Service: [name], [name] Breach. 10/02/2020 10:00 [place] Record Number: [id number] Patient Account Number: [id number] Date of Birth/Sex: Treating RN: 18-Sep-1950 (70 y.o. [name], [name] Primary Care Wenona [place]: [name] Other Clinician: Referring Scorpio Fortin: Treating Evalyn Shultis/Extender: Tommy Rainwater in Treatment: 91 Vital Signs Time Taken: 10:05 Temperature (F): 98.0 Height (in): 63 Pulse (bpm): 88 Weight (lbs): 185 Respiratory Rate (breaths/min): 16 Body Mass Index (BMI): 32.8 Blood Pressure (mmHg): 160/80 Reference Range: 80 - 120 mg / dl Airway Pulse Oximetry (%): 93 Electronic  Signature(s) Signed: 10/25/2020 9:25:46 PM By: Deon Pilling Previous Signature: 10/22/2020 8:50:03 PM Version By: Deon Pilling Entered By: Deon Pilling on 10/25/2020 21:24:57

## 2020-10-25 NOTE — Progress Notes (Signed)
[name], [name] ([id number]) Visit Report for 10/25/2020 Debridement Details Patient Name: Date of Service: [name], [name] 10/25/2020 9:15 A M Medical Record Number: [id number] Patient Account Number: [id number] Date of Birth/Sex: Treating RN: February 20, 1951 (70 y.o. [name], [name] Primary Care Provider: [name] Other Clinician: Referring Provider: Treating Provider/Extender: Tommy Rainwater in Treatment: 95 Debridement Performed for Assessment: Wound #12 Left Ischial Tuberosity Performed By: Physician Ricard Dillon., MD Debridement Type: Debridement Level of Consciousness (Pre-procedure): Awake and Alert Pre-procedure Verification/Time Out Yes - 11:00 Taken: Start Time: 11:01 Pain Control: Lidocaine 4% T opical Solution T Area Debrided (L x W): otal 0.5 (cm) x 0.5 (cm) = 0.25 (cm) Tissue and other material debrided: Viable, Bone Level: Skin/Subcutaneous Tissue/Muscle/Bone Debridement Description: Excisional Instrument: Rongeur Specimen: Swab, Tissue Culture Number of Specimens T aken: 2 Bleeding: Minimum End Time: 11:05 Procedural Pain: 0 Post Procedural Pain: 0 Response to Treatment: Procedure was tolerated well Level of Consciousness (Post- Awake and Alert procedure): Post Debridement Measurements of Total Wound Length: (cm) 3 Stage: Category/Stage IV Width: (cm) 1 Depth: (cm) 1 Volume: (cm) 2.356 Character of Wound/Ulcer Post Debridement: Stable Post Procedure Diagnosis Same as Pre-procedure Electronic Signature(s) Signed: 10/25/2020 5:24:41 PM By: Linton Ham MD Signed: 10/25/2020 6:31:47 PM By: Deon Pilling Entered By: Linton Ham on 10/25/2020 12:12:18 -------------------------------------------------------------------------------- HPI Details Patient Name: Date of Service: [name]. 10/25/2020 9:15 A M Medical Record Number: [id number] Patient Account Number: [id number] Date of Birth/Sex: Treating RN: Jul 20, 1950  (70 y.o. [name] Primary Care Provider: [name] Other Clinician: Referring Provider: Treating Provider/Extender: Tommy Rainwater in Treatment: 95 History of Present Illness Location: open ulceration of the left gluteal area, left heel and right ankle for about 5 months. Quality: Patient reports No Pain. Severity: Patient states wound(s) are getting worse. Duration: Patient has had the wound for > 5 months prior to seeking treatment at the wound center Context: The wound occurred when the patient has been paraplegic for about 3 years. Modifying Factors: Wound improving due to current treatment. ssociated Signs and Symptoms: Patient reports having foul odor. A HPI Description: this 70 year old patient who is known to have hypertension, hypothyroidism, breast cancer, chronic pain syndrome, paraplegia was noted to have a left gluteal decubitus ulcer and was brought into the hospital. During the course of her hospitalization she was debrided in the operating room by ankle wound. Bone cultures were taken at that time but were negative but clinically she was treated for osteomyelitis because of the probing down to bone and open exposed bone. Home health has been giving her antibioticss which include vancomycin and Zosyn. The patient was a smoker until about 3 weeks ago and used to smoke about 10 cigarettes a day for a long while. 12/13/2014 - details of her operative note from 11/03/2014 were reviewed -- PROCEDURE: 1. Excisional debridement skin, subcutaneous, muscle left ischium 35 cm2 2. Excisional debridement skin, subcutaneous tissue left heel 27 cm2 3. Excisional debridement right ankle skin, subcutaneous, bone 30 cm2 01/24/2015 -- she has some issues with her wheelchair cushion but other than that is doing very well and has received Podus boots for her feet. 02/14/2015 -- she was using her old offloading boots and this seemed to have caused her a new pressure ulcer on  the left posterior heel near the superior part just below the Achilles tendon. 03/07/2015 -- she has a new ulceration just to the left of the midline on her  sacral region more on the left buttock and this has been there for Dr. Leland Johns and had all the wounds sharply debrided. The debridement was done for the left ischial wound, the left heel wound and the right about a week. 08/22/2015 -- was recently admitted to hospital between May 5 and 08/13/2015, with sepsis and leukocytosis due to a UTI. she was treated for a sepsis complicating Escherichia coli UTI and kidney stones. She also had metabolic and careful up at the secondary to pyelonephritis. He received broad-spectrum antibiotics initially and then received Macrobid as per urology. She was sent home on nitrofurantoin. during her admission she had a CT scan which showed exposed left ischial tuberosity without evidence of osteolysis. 09/12/2015-- the patient is having some issues with her air mattress and would like to get a opinion from medical modalities. 10/10/2015 -- the issue with her air mattress has not yet been sorted out and the new problem seems to be a lot of odor from the wound VAC. 11/27/2015 -- the patient was admitted to the hospital between July 23 and 10/31/2015. Her problems were sepsis, osteomyelitis of the pelvic bone and acute pyelonephritis. CT of the abdomen and pelvis was consistent with a left-sided pyelonephritis with hydronephrosis and also just showed new sclerosis of the posterior portion of the left anterior pubic ramus suggestive of periosteal reaction consistent with osteomyelitis. She was treated for the osteomyelitis with infectious disease consult recommending 6 weeks of IV antibiotics including vancomycin and Rocephin and the antibiotics were to go on until 12/10/2015. He was seen by Dr. Iran Planas plastic surgery and Dr. Linus Salmons of infectious disease. She had a suprapubic catheter placed during the admission. CT  scan done on 10/28/2015 showed specifically -- New sclerosis of the posterior portion of the left inferior pubic ramus with aggressive periosteal reaction, consistent with osteomyelitis, with adjacent soft tissue gas compatible with previously described decubitus ulcer. 12/12/2015 -- she was recently seen by Dr. Linus Salmons, who noted good improvement and CRP and ESR compared to before and he has stopped her antibiotic as per plans to finish on September 4. The patient was encouraged to continue with wound care and consider hyperbaric oxygen therapy. Today she tells me that she has consented to undergo hyperbaric oxygen therapy and we can start the paperwork. 01/02/2016 -- her PCP had gained about 3 years but she still persists in having problems during hyperbaric oxygen therapy with some discomfort in the ears. 01/09/16; pressure area with underlying osteomyelitis in the left buttock. Wound bed itself has some slight amount of grayish surface slough however I do not think any debridement was necessary. There is no exposed bone soft tissue appears stable. She is using a wound VAC 01/16/16; back for weekly wound review in conjunction with HBO. She has a deep wound over the left initial tuberosity previously treated with 6 weeks of IV antibiotics for osteomyelitis. Wound bed looks reasonably healthy although the base of this is still precariously close to bone. She has been using a wound VAC. 01/23/2016 -- she has completed her course of antibiotics and this week the only new thing is her right great toe nail was avulsed and she has got an open wound over the nailbed. 01/31/16 she has completed her course of antibiotics. Her right great toenail avulsed last week and she's been using silver alginate for this as well. Still using a wound VAC to the substantial stage IV wound over the left ischial tuberosity 03/05/2016 -- the patient has had a opinion  from the plastic surgery group at [facility] and details of  this are not available yet but the patient's verbal report has been heard by me. Did not sound like there was any optimistic discussion regarding reconstruction and the net result would be to continue with the wound VAC application. I will await the official reports. Addendum: -- she was seen at [facility] by Dr. Tressa Busman. After a thorough review and from what I understand spending 45 minutes with the patient his assessment has been noted by me in detail and the management options were: 1. Continued pressure offloading and wound care versus operative procedures including wound excision 2. Soft tissue and bone sampling 3. If the wound gets larger wound closure would be done using a variety of plastic surgical techniques including but not limited to skin substitute, possible skin graft, local versus regional flaps, negative pressure dressing application. 4. He discussed with her details of flap surgery and the risks associated 5. He made a comment that since the patient was operated on by Dr. Leland Johns of [facility] plastic surgery unit in [place] the patient may continue to follow-up there for further evaluation for surgical flap closure in the future. 03/19/2016 -- the patient continues to be rather depressed and frustrated with her lack of rapid progress in healing this wound especially because she thought after hyperbaric oxygen therapy the wound would heal extremely fast. She now understands that was not the implied benefit on wound care which was the recommendation for hyperbaric oxygen therapy. I have had a lengthy discussion with the patient and her husband regarding her options: 1. Continue with collagen and wound VAC for the primary dressing and offloading and all supportive care. 2. See Dr. Iran Planas for possible placement of Acell or Integra in the OR. 3. get a second opinion from a wound care center and surrounding  regions/counties 05/07/2016 -- Note from Dr. Celedonio Miyamoto, who noted that the patient has declined flap surgery. She has discussed application of A cell, and try a few applications to see how the wound progresses. She is also recommended that we could apply products here in the wound center, like Oasis. during her preop workup it was found that her hemoglobin A1c was 11% and she has now been diagnosed as having diabetes mellitus and has been put on appropriate treatment by her PCP 05/28/2016 -- tells me her blood sugars have been doing well and she has an appointment to see her PCP in the next couple of weeks to check her hemoglobin A1c. Other than that she continues to do well. 06/25/2016 -- have not seen her back for the last month but she says her health has been about the same and she has an appointment to check the A1c next week 09/10/16 ---- was seen by Dr. Celedonio Miyamoto -- who applied Acell and saw her back in follow-up. She has recommended silver alginate to the wound every other day and cover with foam. If no significant drainage could transition to collagen every other day. She recommended discontinuing wound VAC. There were no plans to repeat application of Acell. The patient expressed that her husband could do the wound care as going to the Wound Ctr., would cost several $100 for each visit. 10/21/2016 -- her insurance company is getting her new mattress and she is pleased about that. Other than that she has been doing dressings with PolyMem Silver and has been doing very well 02/18/2017 -- she has  gone through several changes of her mattress and has not been pleased with any of them. The ventricles are still working on trying to fit her with the appropriate low air-loss mattress. She has a new wound on the gluteal area which is clearly separated from the original wound. 03/25/17-she is here in follow-up evaluation for her left ischialpressure ulcer. She remains unsatisfied with her  pressure mattress. She admits to sitting multiple hours a day, in the bed. We have discussed offloading options. The wound does not appear infected. Nutrition does not appear to be a concern. Will follow-up in 4 weeks, if wound continues to be stalled may consider x-ray to evaluate for refractory osteomyelitis. 04/21/17; this is a patient that I don't know all that well. She has a chronic wound which at one point had underlying osteomyelitis in the left ischial tuberosity. This is a stage IV pressure ulcer. Over the last 3 months she has a stage II wound inferiorly to the original wound. The last time she was here her dressing was changed to silver collagen although the patient's husband who changes the dressing said that the collagen stuck to the wound and remove skin from the superficial area therefore he switched back to Pelican 05/13/17; this is a patient we've been following for a left ischial tuberosity wound which was stage IV at one point had underlying osteomyelitis. Over the last several months she's had a stage II wound just inferior and medial to the related to the wound. According to her husband he is using Endoform layer with collagen although this is not what I had last time. According to her husband they are using Elgie Congo with collagen although I don't quite know how that started. She was hospitalized from 1/20 through 04/30/16. This was related to a UTI. Her blood cultures were negative, urine culture showed multiple species. She did have a CT scan of the abdomen and pelvis which documented chronic osteomyelitis in the area of the wound inflammatory markers were unremarkable. She has had prior knowledge of osteomyelitis. It looks as though she received IV antibiotics in 2017 and was treated with a course of hyperbaric oxygen. 05/28/17; the wound over the left ischial tuberosity is deeper today and abuts clearly on bone. Nursing intake reported drainage. I therefore culture of  the wound. The more superficial area just below this looks about the same. They once again complained that there are mattress cover is not working although apparently [facility] is been noted to see this many times in the report is that the device is functional 06/18/17; the patient had a probing area on the left ischial tuberosity that was draining purulent fluid last time. This also clearly seemed to have open bone. Culture I did showed pansensitive pseudomonas including third generation cephalosporins. I treated this with cefdinir 300 twice a day for 10 days and things seem to have improved. She has a more superficial wound just underneath this area. Amazingly she has a new air mattress through [facility]. I think they gave this to her as a parking give. In any case this now works according to the patient may have something to do with why the areas are looking better. 07/09/17; the patient has a probing area in the left ischial tuberosity that still has some depth. However this is contracted in terms of the wound orifice although the depth is still roughly the same. There is no undermining. She also has the satellite wound which is more superficial. This  appears to have a healthy surface we've been using silver collagen 08/06/17; the patient's wound is over the left ischial tuberosity and a satellite lesion just underneath this. The original wound was actually a deep stage 4 wound. We have made good progress in 2 months and there is no longer exposed bone here. 09/03/17; left ischial tuberosity actually appears to be quite healthy. I think we are making progress. No debridement is required. There is no surrounding erythema 10/01/17 I follow this patient monthly for her left ischial tuberosity wound. There is 2 areas the original area and a satellite area. The satellite area looks a lot better there is no surrounding erythema. Her husband relates that he is having trouble maintaining the  dressing. This has to do with the soft tissue around it. He states he puts the collagen in but he cannot make sure that it stays in even with the ABD pads and tape that he is been using 10/29/17; patient arrives with a better looking noon today. Some of the satellite lesions have closed. using Prisma 11/26/17; the patient has a large cone-shaped area with the tip of the Cone deep within her buttock soft tissue. The walls of the Cone are epithelialized however the base is still open. The area at the base of this looks moist we've been using silver collagen. Will change to silver alginate 12/31/2017; the wound appears to have come in fairly nicely. Using silver alginate. There is no surrounding maceration or infection 01/28/18; there is still an open area here over the left initial tuberosity. Base of this however looks healthy. There is no surrounding infection 02/25/18; the area of its open is over the left ischial tuberosity. The base of this is where the wound is. This is a large inverted cone-shaped area with the wound at the tip. Dimensions of the wound at the tip are improved. There is a area of denuded skin about halfway towards the tip which her husband thinks may have happened today when he was bathing her. 04/20/17; the area is still open over the left initial tuberosity. 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 This is now completely closed over although it is closed over with a divot and skin over bone at the base of this. Continued aggressive offloading will be necessary. 12/30/2018 READMISSION This is a 70 year old woman with chronic paraplegia. I picked her up for her care from Dr. Con Memos in this clinic after he departed. She had a stage IV pressure wound over the left ischial tuberosity. She was  treated twice for her underlying osteomyelitis and this I believe firstly in 2016 and again in 2017. There were some plans at some point for flap closure of this however she was discovered to have uncontrolled diabetes and I do not think this was ever accomplished. She ultimately healed over in this clinic and was discharged in May. She has a large cone-shaped indentation with the tip of this going towards the left ischial tuberosity. It is not an easy area to examine but at that time I thought all of this was epithelialized. Apparently there was a reopening here shortly after she left the clinic last time. She was admitted to hospital at the end of June for Klebsiella bacteremia felt to be secondary to UTI. A CT scan of  the pelvis is listed below and there was initially some concern that she had underlying osteomyelitis although I believe she was seen by infectious disease and that was felt to be not the case: I do not see any new cultures or inflammatory markers IMPRESSION: 1. No CT evidence for acute intra-abdominal or pelvic abnormality. Large volume of stool throughout the colon. 2. Enlarged fatty liver with fat sparing near the gallbladder fossa 3. Cortical scarring right kidney. Bilateral intrarenal stones without hydronephrosis. Thick-walled urinary bladder decompressed by suprapubic catheter 4. Deep left decubitus ulcer with underlying left ischial changes suggesting osteomyelitis. Her husband has been using silver collagen in the wound. She has not been systemically unwell no fever chills eating and drinking well. They rigorously offload this wound only getting up in the wheelchair when she is going to appointments the rest of the time she is in bed. 10/8; wound measures larger and she now has exposed bone. We have been using silver alginate 11/12 still using silver alginate. The patient saw Dr. Megan Salon of infectious disease. She was started on Augmentin 500 mg twice daily. She is due  to follow-up with Dr. Megan Salon I believe next week. Lab work Dr. Megan Salon requested showed a sedimentation rate of 28 and CRP of 20 although her CRP 1 year ago was 18.8. Sedimentation rate 1 year ago was 11 basic metabolic panel showed a creatinine of 1.12 12/3; the patient followed up with Dr. Megan Salon yesterday. She is still on Augmentin twice daily. This was directed by Dr. Megan Salon. The patient's inflammatory markers have improved which is gratifying. Her C-reactive protein was repeated yesterday and follow-up booked with infectious disease in January. In addition I have been getting secure text messages I think from palliative care through [facility] [employer]. I think they were hoping to provide services to the patient in her home. They could not get a hold of the primary physician and so they reached out to me on 2 separate occasions. 12/17; patient last saw Dr. Megan Salon on 12/2. She is finishing up with Augmentin. Her C-reactive protein was 20 on 10/21, 10.1 on 11/19 and 17 on 12/2. The wound itself still has depth and undermining. We are using Santyl with the backing wet-to-dry 04/27/2019. The wound is gradually clearing up in terms of the surface although it is not filled in that much. Still abuts right against bone 2/4; patient with a deep pressure ulcer over the left ischial tuberosity. I thought she was going to follow-up with infectious disease to follow her inflammatory markers although the patient states that they stated that they did not need to see her unless we felt it was necessary. I will need to check their notes. In any case we ordered moistened silver collagen back with wet-to-dry to fill in the depth of the wound although apparently [facility] sent silver alginate which they have been using since they were here the last time. Is obviously not what we ordered. 2/25. Not much change in this wound it is over the left ischial tuberosity recurrent wound. We have been  using silver collagen with backing wet-to-dry. I think the wound is about the same. There is still some tunneling from about 10-12 o'clock over the ischial tuberosity itself 3/11; pressure ulcer over the left ischial tuberosity. Since she was last here the wound VAC was started and apparently going quite well. We are able to get the home health company that accepts [place] healthcare which is in itself sometimes problematic. There is  been improvements in the wound the tunneling seems to be better and is contracted nicely 4/8; 1 month follow-up. Since she was last here we have been using silver collagen under a wound VAC. Some minor contraction I think in wound volume. She is cared for diligently by her husband including pressure relief, incontinence management, nutritional support etc. 6/1; this is almost a 84-monthfollow-up. She is been using silver collagen under wound VAC. Circular area over the left ischial tuberosity. She has been using silver collagen under wound VAC 7/8; 1 month follow-up. Silver collagen under the VAC not really a lot of progress. Tissue at the base of the wound which is right against bone and the tissue next that this does not look completely viable. She is not currently on any antibiotics, she had underlying osteomyelitis I need to look this over 8/16; we are using silver collagen under wound VAC to the left ischial tuberosity wound. Comes in today with absolutely no change in surface area or depth. There is no exposed bone. I did look over her infectious disease notes as I said I would do last time. She last saw Dr. CMegan Salonin December 2020. She completed 6 weeks of Augmentin. This was in response to a bone culture I did showing methicillin susceptible staph aureus and Enterococcus. She was supposed to come back to see Dr. CMegan Salonat some point although they say that that appointment was canceled unless I chose to recommend return. I think there was supposed to be  follow-up with inflammatory markers but I cannot see that that was ever done. She has not been on antibiotics since 9/21; monthly follow-up. We received a call from home health nurse last evening to report green drainage coming out of the wound. Lab work I ordered last time showed a white count of 5.2 a sedimentation rate of 45 and a C-reactive protein of 25 however neither one of the 2 values are substantially different from her previous values in October 2020 or December 2020. Both are slightly higher but only marginally. Otherwise no new complaints from the patient or her husband 10/19; 1 month follow-up. PCR culture I did last time showed medium quantities of Pseudomonas lower quantities Klebsiella and Enterococcus faecalis group B strep and Peptostreptococcus. I gave her Augmentin for 2 weeks. I am not really sure of my choice of this I would not cover Pseudomonas. She is still having green drainage. Wound itself looks satisfactory there is not a lot of depth wound bed looks healthy 11/16; patient has completed the antibiotics still using gentamicin and silver alginate on the wound. There is improvement in the surface area 12/21; in general the area on the buttock looks somewhat better. Surface looks healthy although I do not know that there is been much improvement in the wound volume. We have been using silver alginate and Hydrofera Blue. Less drainage. In passing the husband showed me an abrasion injury on the left anterior tibia. Covered in necrotic surface. He has noticed this for about a week and has been putting silver collagen on it. He is completely uncertain about how this happened 1/25; monthly follow-up. The area on the left buttock is about the same. This does not go to bone but a fairly deep wound surface of the wound is of questionable viability. The abrasion injury that they showed me last time apparently was closed out by home health because they thought it was healed but  certainly is not although it is just about healed. As  a result they haven't been applying anything to this area Finally I did discuss with the patient and her husband the idea of an advanced treatment product to try and get a proper base to this wound I was thinking of Puraply however actually the patient points out that her co-pay for coming to visit [place]e. the facility, charge would be unaffordable if they have to, on a weekly basis 2/22; pressure ulcer on the left buttock appears deeper to me and abuts on the ischial tuberosity. I thought initially there was exposed bone but there is a rim of tissue over this area. She also has a superficial over the right anterior mid tibia. Been using silver collagen to these areas without much success. I have looked over the patient's past history with regards to the area on the left ischium. She did have underlying osteomyelitis here dating back I think to late 2020. She saw Dr. Megan Salon she received a 6-week course of oral antibiotics in response to a bone culture that I did. This does not appear to be infected but it certainly has not been improving in terms of granulation. I do not believe she has had any recent imaging studies 3/29; 1 month follow-up. Pressure ulcer on the left buttock which she has been dealing with with for a number of years. She was treated for underlying osteomyelitis at 1.2 or 3 years ago I think with infectious disease help. She also had I think a flap closure by Dr. Leland Johns and that lasted for about a year and then reopened. I have not been able to get this patient to progress towards healing although truthfully the wound is absolutely no worse. We have been using Hydrofera Blue 4/26; patient presents for 1 week follow-up. She has been using silver collagen to the area every other day. She has home health that comes out once a week to help with dressing changes as well. The patient is interested in trying a skin substitute over  this area. She states she is trying to relieve pressure off of it most of the day. 5/24; patient presents for 1 month follow-up. She has been using silver collagen to the area every other day. She has no complaints today. She is interested in the skin substitute. She tries to leave relieve pressure off Her bottom however is not able to most of the day 6/14; using silver collagen to the area over the left ischial tuberosity. Wound does not appear to be doing particularly well. Open to bone 6/28; the patient patient presented last time with a marked deterioration. Depth probing all the way to bone. The bone itself did not look particularly viable. In spite of this the x-ray I did showed longstanding ulcer over the left ischial tuberosity with chronic bone involvement/reaction that was also seen by CT in 2020. Lab work did not show just active infection with a sed rate of 14 and a C-reactive protein of 7.1. Her comprehensive metabolic panel was normal including an albumin of 4.1 white count was 6 7/19; patient was here 3 weeks ago with a marked deterioration in her wound over the left ischial tuberosity. I ordered a CT scan of the area for 1 reason or another this just did not get done. It is now booked for 8 days from now. She was supposed to come back for bone culture and pathology. That did not happen either. We have been using silver collagen. As usual she is diligently looked after by her husband Electronic  Signature(s) Signed: 10/25/2020 5:24:41 PM By: Linton Ham MD Entered By: Linton Ham on 10/25/2020 12:24:50 -------------------------------------------------------------------------------- Physical Exam Details Patient Name: Date of Service: [name]. 10/25/2020 9:15 A M Medical Record Number: [id number] Patient Account Number: [id number] Date of Birth/Sex: Treating RN: 05/26/1950 (70 y.o. [name] Primary Care Provider: [name] Other Clinician: Referring  Provider: Treating Provider/Extender: Tommy Rainwater in Treatment: 95 Constitutional Sitting or standing Blood Pressure is within target range for patient.. Pulse regular and within target range for patient.Marland Kitchen Respirations regular, non-labored and within target range.. Temperature is normal and within the target range for the patient.Marland Kitchen Appears in no distress. Notes Wound exam; if anything somewhat better than when she was here 3 weeks ago. There is still exposed bone but better looking granulation. I used rongeurs to do specimens for pathology and bone culture. There is no overt soft tissue infection around the wound Electronic Signature(s) Signed: 10/25/2020 5:24:41 PM By: Linton Ham MD Entered By: Linton Ham on 10/25/2020 12:26:29 -------------------------------------------------------------------------------- Physician Orders Details Patient Name: Date of Service: [name]. 10/25/2020 9:15 A M Medical Record Number: [id number] Patient Account Number: [id number] Date of Birth/Sex: Treating RN: 05/18/1950 (70 y.o. [name], [name] Primary Care Provider: [name] Other Clinician: Referring Provider: Treating Provider/Extender: Tommy Rainwater in Treatment: 95 Verbal / Phone Orders: No Diagnosis Coding ICD-10 Coding Code Description [phone] Pressure ulcer of left buttock, stage 4 E11.622 Type 2 diabetes mellitus with other skin ulcer Follow-up Appointments ppointment in 2 weeks. Margarita Grizzle Wednesday 11/07/2020 Return A Will call if any tests results and if need any additional orders. Bathing/ Shower/ Hygiene May shower and wash wound with soap and water. - with dressingchanges Edema Control - Lymphedema / SCD / Other Elevate legs to the level of the heart or above for 30 minutes daily and/or when sitting, a frequency of: Avoid standing for long periods of time. Off-Loading Turn and reposition every 2 hours Home Health Other Home  Health Orders/Instructions: - [facility] Wound #12 - Ischial Tuberosity Wound Laterality: Left Cleanser: Soap and Water [facility]) Every Other Day/30 Days Discharge Instructions: May shower and wash wound with dial antibacterial soap and water prior to dressing change. Cleanser: Wound Cleanser [facility]) Every Other Day/30 Days Discharge Instructions: Cleanse the wound with wound cleanser prior to applying a clean dressing using gauze sponges, not tissue or cotton balls. Peri-Wound Care: Skin Prep [facility]) Every Other Day/30 Days Discharge Instructions: Use skin prep as directed Prim Dressing: Promogran Prisma Matrix, 4.34 (sq in) (silver collagen) (Home Health) Every Other Day/30 Days ary Discharge Instructions: Moisten collagen with saline or hydrogel. Apply moistened gauze on top of prisma. Secondary Dressing: ComfortFoam Border, 4x4 in (silicone border) [facility]) Every Other Day/30 Days Discharge Instructions: Apply over primary dressing as directed. Laboratory erobe culture (MICRO) - bone culture of left ischium - (ICD10 L89.324 - Pressure ulcer of left Bacteria identified in Unspecified specimen by A buttock, stage 4) LOINC Code: 025-8 Convenience Name: Areobic culture-specimen not specified Bacteria identified in Tissue by Biopsy culture (MICRO) - bone biopsy left ischium - (ICD10 L89.324 - Pressure ulcer of left buttock, stage 4) LOINC Code: 52778-2 Convenience Name: Biopsy specimen culture Electronic Signature(s) Signed: 10/25/2020 5:24:41 PM By: Linton Ham MD Signed: 10/25/2020 6:31:47 PM By: Deon Pilling Entered By: Deon Pilling on 10/25/2020 11:09:09 -------------------------------------------------------------------------------- Problem List Details Patient Name: Date of Service: [name], Molli Posey M. 10/25/2020 9:15 A  M Medical Record Number: [id number] Patient Account Number: [id number] Date of Birth/Sex: Treating RN: May 30, 1950 (70  y.o. [name], [name] Primary Care Provider: [name] Other Clinician: Referring Provider: Treating Provider/Extender: Tommy Rainwater in Treatment: 95 Active Problems ICD-10 Encounter Code Description Active Date MDM Diagnosis 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, stage 4 12/30/2018 No Yes E11.622 Type 2 diabetes mellitus with other skin ulcer 12/30/2018 No Yes Inactive Problems ICD-10 Code Description Active Date Inactive Date G82.20 Paraplegia, unspecified 12/30/2018 12/30/2018 M86.68 Other chronic osteomyelitis, other site 02/17/2019 02/17/2019 S80.811D Abrasion, right lower leg, subsequent encounter 03/27/2020 03/27/2020 L97.811 Non-pressure chronic ulcer of other part of right lower leg limited to breakdown of skin 03/27/2020 03/27/2020 Resolved Problems Electronic Signature(s) Signed: 10/25/2020 5:24:41 PM By: Linton Ham MD Entered By: Linton Ham on 10/25/2020 12:11:51 -------------------------------------------------------------------------------- Progress Note Details Patient Name: Date of Service: [name]. 10/25/2020 9:15 A M Medical Record Number: [id number] Patient Account Number: [id number] Date of Birth/Sex: Treating RN: November 18, 1950 (70 y.o. [name] Primary Care Provider: [name] Other Clinician: Referring Provider: Treating Provider/Extender: Tommy Rainwater in Treatment: 95 Subjective History of Present Illness (HPI) The following HPI elements were documented for the patient's wound: Location: open ulceration of the left gluteal area, left heel and right ankle for about 5 months. Quality: Patient reports No Pain. Severity: Patient states wound(s) are getting worse. Duration: Patient has had the wound for > 5 months prior to seeking treatment at the wound center Context: The wound occurred when the patient has been paraplegic for about 3 years. Modifying Factors: Wound improving due to current  treatment. Associated Signs and Symptoms: Patient reports having foul odor. this 70 year old patient who is known to have hypertension, hypothyroidism, breast cancer, chronic pain syndrome, paraplegia was noted to have a left gluteal decubitus ulcer and was brought into the hospital. During the course of her hospitalization she was debrided in the operating room by ankle wound. Bone cultures were taken at that time but were negative but clinically she was treated for osteomyelitis because of the probing down to bone and open exposed bone. Home health has been giving her antibioticss which include vancomycin and Zosyn. The patient was a smoker until about 3 weeks ago and used to smoke about 10 cigarettes a day for a long while. 12/13/2014 - details of her operative note from 11/03/2014 were reviewed -- PROCEDURE: 1. Excisional debridement skin, subcutaneous, muscle left ischium 35 cm2 2. Excisional debridement skin, subcutaneous tissue left heel 27 cm2 3. Excisional debridement right ankle skin, subcutaneous, bone 30 cm2 01/24/2015 -- she has some issues with her wheelchair cushion but other than that is doing very well and has received Podus boots for her feet. 02/14/2015 -- she was using her old offloading boots and this seemed to have caused her a new pressure ulcer on the left posterior heel near the superior part just below the Achilles tendon. 03/07/2015 -- she has a new ulceration just to the left of the midline on her sacral region more on the left buttock and this has been there for Dr. Leland Johns and had all the wounds sharply debrided. The debridement was done for the left ischial wound, the left heel wound and the right about a week. 08/22/2015 -- was recently admitted to hospital between May 5 and 08/13/2015, with sepsis and leukocytosis due to a UTI. she was treated for a sepsis complicating Escherichia coli UTI and kidney stones. She also had metabolic and  careful up at the secondary to  pyelonephritis. He received broad-spectrum antibiotics initially and then received Macrobid as per urology. She was sent home on nitrofurantoin. during her admission she had a CT scan which showed exposed left ischial tuberosity without evidence of osteolysis. 09/12/2015-- the patient is having some issues with her air mattress and would like to get a opinion from medical modalities. 10/10/2015 -- the issue with her air mattress has not yet been sorted out and the new problem seems to be a lot of odor from the wound VAC. 11/27/2015 -- the patient was admitted to the hospital between July 23 and 10/31/2015. Her problems were sepsis, osteomyelitis of the pelvic bone and acute pyelonephritis. CT of the abdomen and pelvis was consistent with a left-sided pyelonephritis with hydronephrosis and also just showed new sclerosis of the posterior portion of the left anterior pubic ramus suggestive of periosteal reaction consistent with osteomyelitis. She was treated for the osteomyelitis with infectious disease consult recommending 6 weeks of IV antibiotics including vancomycin and Rocephin and the antibiotics were to go on until 12/10/2015. He was seen by Dr. Iran Planas plastic surgery and Dr. Linus Salmons of infectious disease. She had a suprapubic catheter placed during the admission. CT scan done on 10/28/2015 showed specifically -- New sclerosis of the posterior portion of the left inferior pubic ramus with aggressive periosteal reaction, consistent with osteomyelitis, with adjacent soft tissue gas compatible with previously described decubitus ulcer. 12/12/2015 -- she was recently seen by Dr. Linus Salmons, who noted good improvement and CRP and ESR compared to before and he has stopped her antibiotic as per plans to finish on September 4. The patient was encouraged to continue with wound care and consider hyperbaric oxygen therapy. Today she tells me that she has consented to undergo hyperbaric oxygen therapy and we can  start the paperwork. 01/02/2016 -- her PCP had gained about 3 years but she still persists in having problems during hyperbaric oxygen therapy with some discomfort in the ears. 01/09/16; pressure area with underlying osteomyelitis in the left buttock. Wound bed itself has some slight amount of grayish surface slough however I do not think any debridement was necessary. There is no exposed bone soft tissue appears stable. She is using a wound VAC 01/16/16; back for weekly wound review in conjunction with HBO. She has a deep wound over the left initial tuberosity previously treated with 6 weeks of IV antibiotics for osteomyelitis. Wound bed looks reasonably healthy although the base of this is still precariously close to bone. She has been using a wound VAC. 01/23/2016 -- she has completed her course of antibiotics and this week the only new thing is her right great toe nail was avulsed and she has got an open wound over the nailbed. 01/31/16 she has completed her course of antibiotics. Her right great toenail avulsed last week and she's been using silver alginate for this as well. Still using a wound VAC to the substantial stage IV wound over the left ischial tuberosity 03/05/2016 -- the patient has had a opinion from the plastic surgery group at [facility] and details of this are not available yet but the patient's verbal report has been heard by me. Did not sound like there was any optimistic discussion regarding reconstruction and the net result would be to continue with the wound VAC application. I will await the official reports. Addendum: -- she was seen at [facility] by Dr. Tressa Busman. After a thorough review and  from what I understand spending 45 minutes with the patient his assessment has been noted by me in detail and the management options were: 1. Continued pressure offloading and wound care versus operative procedures including wound excision 2.  Soft tissue and bone sampling 3. If the wound gets larger wound closure would be done using a variety of plastic surgical techniques including but not limited to skin substitute, possible skin graft, local versus regional flaps, negative pressure dressing application. 4. He discussed with her details of flap surgery and the risks associated 5. He made a comment that since the patient was operated on by Dr. Leland Johns of [facility] plastic surgery unit in [place] the patient may continue to follow-up there for further evaluation for surgical flap closure in the future. 03/19/2016 -- the patient continues to be rather depressed and frustrated with her lack of rapid progress in healing this wound especially because she thought after hyperbaric oxygen therapy the wound would heal extremely fast. She now understands that was not the implied benefit on wound care which was the recommendation for hyperbaric oxygen therapy. I have had a lengthy discussion with the patient and her husband regarding her options: 1. Continue with collagen and wound VAC for the primary dressing and offloading and all supportive care. 2. See Dr. Iran Planas for possible placement of Acell or Integra in the OR. 3. get a second opinion from a wound care center and surrounding regions/counties 05/07/2016 -- Note from Dr. Celedonio Miyamoto, who noted that the patient has declined flap surgery. She has discussed application of A cell, and try a few applications to see how the wound progresses. She is also recommended that we could apply products here in the wound center, like Oasis. during her preop workup it was found that her hemoglobin A1c was 11% and she has now been diagnosed as having diabetes mellitus and has been put on appropriate treatment by her PCP 05/28/2016 -- tells me her blood sugars have been doing well and she has an appointment to see her PCP in the next couple of weeks to check her hemoglobin A1c. Other  than that she continues to do well. 06/25/2016 -- have not seen her back for the last month but she says her health has been about the same and she has an appointment to check the A1c next week 09/10/16 ---- was seen by Dr. Celedonio Miyamoto -- who applied Acell and saw her back in follow-up. She has recommended silver alginate to the wound every other day and cover with foam. If no significant drainage could transition to collagen every other day. She recommended discontinuing wound VAC. There were no plans to repeat application of Acell. The patient expressed that her husband could do the wound care as going to the Wound Ctr., would cost several $100 for each visit. 10/21/2016 -- her insurance company is getting her new mattress and she is pleased about that. Other than that she has been doing dressings with PolyMem Silver and has been doing very well 02/18/2017 -- she has gone through several changes of her mattress and has not been pleased with any of them. The ventricles are still working on trying to fit her with the appropriate low air-loss mattress. She has a new wound on the gluteal area which is clearly separated from the original wound. 03/25/17-she is here in follow-up evaluation for her left ischialpressure ulcer. She remains unsatisfied with her pressure mattress. She admits to sitting multiple hours a day, in the bed.  We have discussed offloading options. The wound does not appear infected. Nutrition does not appear to be a concern. Will follow-up in 4 weeks, if wound continues to be stalled may consider x-ray to evaluate for refractory osteomyelitis. 04/21/17; this is a patient that I don't know all that well. She has a chronic wound which at one point had underlying osteomyelitis in the left ischial tuberosity. This is a stage IV pressure ulcer. Over the last 3 months she has a stage II wound inferiorly to the original wound. The last time she was here her dressing was changed to silver  collagen although the patient's husband who changes the dressing said that the collagen stuck to the wound and remove skin from the superficial area therefore he switched back to [place] 05/13/17; this is a patient we've been following for a left ischial tuberosity wound which was stage IV at one point had underlying osteomyelitis. Over the last several months she's had a stage II wound just inferior and medial to the related to the wound. According to her husband he is using Endoform layer with collagen although this is not what I had last time. According to her husband they are using Elgie [place] with collagen although I don't quite know how that started. She was hospitalized from 1/20 through 04/30/16. This was related to a UTI. Her blood cultures were negative, urine culture showed multiple species. She did have a CT scan of the abdomen and pelvis which documented chronic osteomyelitis in the area of the wound inflammatory markers were unremarkable. She has had prior knowledge of osteomyelitis. It looks as though she received IV antibiotics in 2017 and was treated with a course of hyperbaric oxygen. 05/28/17; the wound over the left ischial tuberosity is deeper today and abuts clearly on bone. Nursing intake reported drainage. I therefore culture of the wound. The more superficial area just below this looks about the same. They once again complained that there are mattress cover is not working although apparently [facility] is been noted to see this many times in the report is that the device is functional 06/18/17; the patient had a probing area on the left ischial tuberosity that was draining purulent fluid last time. This also clearly seemed to have open bone. Culture I did showed pansensitive pseudomonas including third generation cephalosporins. I treated this with cefdinir 300 twice a day for 10 days and things seem to have improved. She has a more superficial wound just underneath  this area. Amazingly she has a new air mattress through [facility]. I think they gave this to her as a parking give. In any case this now works according to the patient may have something to do with why the areas are looking better. 07/09/17; the patient has a probing area in the left ischial tuberosity that still has some depth. However this is contracted in terms of the wound orifice although the depth is still roughly the same. There is no undermining. ooShe also has the satellite wound which is more superficial. This appears to have a healthy surface we've been using silver collagen 08/06/17; the patient's wound is over the left ischial tuberosity and a satellite lesion just underneath this. The original wound was actually a deep stage 4 wound. We have made good progress in 2 months and there is no longer exposed bone here. 09/03/17; left ischial tuberosity actually appears to be quite healthy. I think we are making progress. No debridement is required. There is no surrounding  erythema 10/01/17 I follow this patient monthly for her left ischial tuberosity wound. There is 2 areas the original area and a satellite area. The satellite area looks a lot better there is no surrounding erythema. Her husband relates that he is having trouble maintaining the dressing. This has to do with the soft tissue around it. He states he puts the collagen in but he cannot make sure that it stays in even with the ABD pads and tape that he is been using 10/29/17; patient arrives with a better looking noon today. Some of the satellite lesions have closed. using Prisma 11/26/17; the patient has a large cone-shaped area with the tip of the Cone deep within her buttock soft tissue. The walls of the Cone are epithelialized however the base is still open. The area at the base of this looks moist we've been using silver collagen. Will change to silver alginate 12/31/2017; the wound appears to have come in fairly nicely.  Using silver alginate. There is no surrounding maceration or infection 01/28/18; there is still an open area here over the left initial tuberosity. Base of this however looks healthy. There is no surrounding infection 02/25/18; the area of its open is over the left ischial tuberosity. The base of this is where the wound is. This is a large inverted cone-shaped area with the wound at the tip. Dimensions of the wound at the tip are improved. There is a area of denuded skin about halfway towards the tip which her husband thinks may have happened today when he was bathing her. 04/20/17; the area is still open over the left initial tuberosity. 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 This is now completely closed over although it is closed over with a divot and skin over bone at the base of this. Continued aggressive offloading will be necessary. 12/30/2018 READMISSION This is a 70 year old woman with chronic paraplegia. I picked her up for her care from Dr. Con Memos in this clinic after he departed. She had a stage IV pressure wound over the left ischial tuberosity. She was treated twice for her underlying osteomyelitis and this I believe firstly in 2016 and again in 2017. There were some plans at some point for flap closure of this however she was discovered to have uncontrolled diabetes and I do not think this was ever accomplished. She ultimately healed over in this clinic and was discharged in May. She has a large cone-shaped indentation with the tip of this going towards the left ischial tuberosity. It is not an easy area to examine but at that time I thought all of this was epithelialized. Apparently there was a reopening here shortly after she left the clinic last time. She was admitted to hospital at the end  of June for Klebsiella bacteremia felt to be secondary to UTI. A CT scan of the pelvis is listed below and there was initially some concern that she had underlying osteomyelitis although I believe she was seen by infectious disease and that was felt to be not the case: I do not see any new cultures or inflammatory markers IMPRESSION: 1. No CT evidence for acute intra-abdominal or pelvic abnormality. Large volume of stool throughout the colon. 2. Enlarged fatty liver with fat sparing near the gallbladder fossa 3. Cortical scarring right kidney.  Bilateral intrarenal stones without hydronephrosis. Thick-walled urinary bladder decompressed by suprapubic catheter 4. Deep left decubitus ulcer with underlying left ischial changes suggesting osteomyelitis. Her husband has been using silver collagen in the wound. She has not been systemically unwell no fever chills eating and drinking well. They rigorously offload this wound only getting up in the wheelchair when she is going to appointments the rest of the time she is in bed. 10/8; wound measures larger and she now has exposed bone. We have been using silver alginate 11/12 still using silver alginate. The patient saw Dr. Megan Salon of infectious disease. She was started on Augmentin 500 mg twice daily. She is due to follow-up with Dr. Megan Salon I believe next week. Lab work Dr. Megan Salon requested showed a sedimentation rate of 28 and CRP of 20 although her CRP 1 year ago was 18.8. Sedimentation rate 1 year ago was 11 basic metabolic panel showed a creatinine of 1.12 12/3; the patient followed up with Dr. Megan Salon yesterday. She is still on Augmentin twice daily. This was directed by Dr. Megan Salon. The patient's inflammatory markers have improved which is gratifying. Her C-reactive protein was repeated yesterday and follow-up booked with infectious disease in January. In addition I have been getting secure text messages I think from palliative care through  [facility] [employer]. I think they were hoping to provide services to the patient in her home. They could not get a hold of the primary physician and so they reached out to me on 2 separate occasions. 12/17; patient last saw Dr. Megan Salon on 12/2. She is finishing up with Augmentin. Her C-reactive protein was 20 on 10/21, 10.1 on 11/19 and 17 on 12/2. The wound itself still has depth and undermining. We are using Santyl with the backing wet-to-dry 04/27/2019. The wound is gradually clearing up in terms of the surface although it is not filled in that much. Still abuts right against bone 2/4; patient with a deep pressure ulcer over the left ischial tuberosity. I thought she was going to follow-up with infectious disease to follow her inflammatory markers although the patient states that they stated that they did not need to see her unless we felt it was necessary. I will need to check their notes. In any case we ordered moistened silver collagen back with wet-to-dry to fill in the depth of the wound although apparently [facility] sent silver alginate which they have been using since they were here the last time. Is obviously not what we ordered. 2/25. Not much change in this wound it is over the left ischial tuberosity recurrent wound. We have been using silver collagen with backing wet-to-dry. I think the wound is about the same. There is still some tunneling from about 10-12 o'clock over the ischial tuberosity itself 3/11; pressure ulcer over the left ischial tuberosity. Since she was last here the wound VAC was started and apparently going quite well. We are able to get the home health company that accepts [place] healthcare which is in itself sometimes problematic. There is been improvements in the wound the tunneling seems to be better and is contracted nicely 4/8; 1 month follow-up. Since she was last here we have been using silver collagen under a wound VAC. Some minor contraction I  think in wound volume. She is cared for diligently by her husband including pressure relief, incontinence management, nutritional support etc. 6/1; this is almost a 71-monthfollow-up. She is been using silver collagen under wound VAC. Circular area over the  left ischial tuberosity. She has been using silver collagen under wound VAC 7/8; 1 month follow-up. Silver collagen under the VAC not really a lot of progress. Tissue at the base of the wound which is right against bone and the tissue next that this does not look completely viable. She is not currently on any antibiotics, she had underlying osteomyelitis I need to look this over 8/16; we are using silver collagen under wound VAC to the left ischial tuberosity wound. Comes in today with absolutely no change in surface area or depth. There is no exposed bone. I did look over her infectious disease notes as I said I would do last time. She last saw Dr. Megan Salon in December 2020. She completed 6 weeks of Augmentin. This was in response to a bone culture I did showing methicillin susceptible staph aureus and Enterococcus. She was supposed to come back to see Dr. Megan Salon at some point although they say that that appointment was canceled unless I chose to recommend return. I think there was supposed to be follow-up with inflammatory markers but I cannot see that that was ever done. She has not been on antibiotics since 9/21; monthly follow-up. We received a call from home health nurse last evening to report green drainage coming out of the wound. Lab work I ordered last time showed a white count of 5.2 a sedimentation rate of 45 and a C-reactive protein of 25 however neither one of the 2 values are substantially different from her previous values in October 2020 or December 2020. Both are slightly higher but only marginally. Otherwise no new complaints from the patient or her husband 10/19; 1 month follow-up. PCR culture I did last time showed medium  quantities of Pseudomonas lower quantities Klebsiella and Enterococcus faecalis group B strep and Peptostreptococcus. I gave her Augmentin for 2 weeks. I am not really sure of my choice of this I would not cover Pseudomonas. She is still having green drainage. Wound itself looks satisfactory there is not a lot of depth wound bed looks healthy 11/16; patient has completed the antibiotics still using gentamicin and silver alginate on the wound. There is improvement in the surface area 12/21; in general the area on the buttock looks somewhat better. Surface looks healthy although I do not know that there is been much improvement in the wound volume. We have been using silver alginate and Hydrofera Blue. Less drainage. In passing the husband showed me an abrasion injury on the left anterior tibia. Covered in necrotic surface. He has noticed this for about a week and has been putting silver collagen on it. He is completely uncertain about how this happened 1/25; monthly follow-up. The area on the left buttock is about the same. This does not go to bone but a fairly deep wound surface of the wound is of questionable viability. The abrasion injury that they showed me last time apparently was closed out by home health because they thought it was healed but certainly is not although it is just about healed. As a result they haven't been applying anything to this area Finally I did discuss with the patient and her husband the idea of an advanced treatment product to try and get a proper base to this wound I was thinking of Puraply however actually the patient points out that her co-pay for coming to visit [place]. the facility, charge would be unaffordable if they have to, on a weekly basis 2/22; pressure ulcer on the left buttock appears  deeper to me and abuts on the ischial tuberosity. I thought initially there was exposed bone but there is a rim of tissue over this area. She also has a superficial over the  right anterior mid tibia. Been using silver collagen to these areas without much success. I have looked over the patient's past history with regards to the area on the left ischium. She did have underlying osteomyelitis here dating back I think to late 2020. She saw Dr. Megan Salon she received a 6-week course of oral antibiotics in response to a bone culture that I did. This does not appear to be infected but it certainly has not been improving in terms of granulation. I do not believe she has had any recent imaging studies 3/29; 1 month follow-up. Pressure ulcer on the left buttock which she has been dealing with with for a number of years. She was treated for underlying osteomyelitis at 1.2 or 3 years ago I think with infectious disease help. She also had I think a flap closure by Dr. Leland Johns and that lasted for about a year and then reopened. I have not been able to get this patient to progress towards healing although truthfully the wound is absolutely no worse. We have been using Hydrofera Blue 4/26; patient presents for 1 week follow-up. She has been using silver collagen to the area every other day. She has home health that comes out once a week to help with dressing changes as well. The patient is interested in trying a skin substitute over this area. She states she is trying to relieve pressure off of it most of the day. 5/24; patient presents for 1 month follow-up. She has been using silver collagen to the area every other day. She has no complaints today. She is interested in the skin substitute. She tries to leave relieve pressure off Her bottom however is not able to most of the day 6/14; using silver collagen to the area over the left ischial tuberosity. Wound does not appear to be doing particularly well. Open to bone 6/28; the patient patient presented last time with a marked deterioration. Depth probing all the way to bone. The bone itself did not look particularly viable. In spite of  this the x-ray I did showed longstanding ulcer over the left ischial tuberosity with chronic bone involvement/reaction that was also seen by CT in 2020. Lab work did not show just active infection with a sed rate of 14 and a C-reactive protein of 7.1. Her comprehensive metabolic panel was normal including an albumin of 4.1 white count was 6 7/19; patient was here 3 weeks ago with a marked deterioration in her wound over the left ischial tuberosity. I ordered a CT scan of the area for 1 reason or another this just did not get done. It is now booked for 8 days from now. She was supposed to come back for bone culture and pathology. That did not happen either. We have been using silver collagen. As usual she is diligently looked after by her husband Objective Constitutional Sitting or standing Blood Pressure is within target range for patient.. Pulse regular and within target range for patient.Marland Kitchen Respirations regular, non-labored and within target range.. Temperature is normal and within the target range for the patient.Marland Kitchen Appears in no distress. Vitals Time Taken: 10:35 AM, Height: 63 in, Weight: 185 lbs, BMI: 32.8, Temperature: 98.0 F, Pulse: 82 bpm, Respiratory Rate: 16 breaths/min, Blood Pressure: 117/77 mmHg. General Notes: Wound exam; if anything somewhat better than  when she was here 3 weeks ago. There is still exposed bone but better looking granulation. I used rongeurs to do specimens for pathology and bone culture. There is no overt soft tissue infection around the wound Integumentary (Hair, Skin) Wound #12 status is Open. Original cause of wound was Pressure Injury. The date acquired was: 09/06/2018. The wound has been in treatment 95 weeks. The wound is located on the Left Ischial Tuberosity. The wound measures 3cm length x 1cm width x 1cm depth; 2.356cm^2 area and 2.356cm^3 volume. There is Fat Layer (Subcutaneous Tissue) exposed. There is no tunneling or undermining noted. There is a medium  amount of serosanguineous drainage noted. The wound margin is well defined and not attached to the wound base. There is large (67-100%) red, pink granulation within the wound bed. There is a small (1-33%) amount of necrotic tissue within the wound bed including Adherent Slough. 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 There was a Excisional Skin/Subcutaneous Tissue/Muscle/Bone Debridement with a total area of 0.25 sq cm performed by Ricard Dillon., MD. With the following instrument(s): Rongeur to remove Viable tissue/material. Material removed includes Bone after achieving pain control using Lidocaine 4% T opical Solution. 2 specimens were taken by a Swab and Tissue Culture and sent to the lab per facility protocol. A time out was conducted at 11:00, prior to the start of the procedure. A Minimum amount of bleeding was controlled with N/A. The procedure was tolerated well with a pain level of 0 throughout and a pain level of 0 following the procedure. Post Debridement Measurements: 3cm length x 1cm width x 1cm depth; 2.356cm^3 volume. Post debridement Stage noted as Category/Stage IV. Character of Wound/Ulcer Post Debridement is stable. Post procedure Diagnosis Wound #12: Same as Pre-Procedure Plan Follow-up Appointments: Return Appointment in 2 weeks. Margarita Grizzle Wednesday 11/07/2020 Will call if any tests results and if need any additional orders. Bathing/ Shower/ Hygiene: May shower and wash wound with soap and water. - with dressingchanges Edema Control - Lymphedema / SCD / Other: Elevate legs to the level of the heart or above for 30 minutes daily and/or when sitting, a frequency of: Avoid standing for long periods of time. Off-Loading: Turn and reposition every 2 hours Home Health: Other  Home Health Orders/Instructions: - [facility] ordered were: Areobic culture-specimen not specified - bone culture of left ischium, Biopsy specimen culture - bone biopsy left ischium WOUND #12: - Ischial Tuberosity Wound Laterality: Left Cleanser: Soap and Water [facility]) Every Other Day/30 Days Discharge Instructions: May shower and wash wound with dial antibacterial soap and water prior to dressing change. Cleanser: Wound Cleanser [facility]) Every Other Day/30 Days Discharge Instructions: Cleanse the wound with wound cleanser prior to applying a clean dressing using gauze sponges, not tissue or cotton balls. Peri-Wound Care: Skin Prep [facility]) Every Other Day/30 Days Discharge Instructions: Use skin prep as directed Prim Dressing: Promogran Prisma Matrix, 4.34 (sq in) (silver collagen) (Home Health) Every Other Day/30 Days ary Discharge Instructions: Moisten collagen with saline or hydrogel. Apply moistened gauze on top of prisma. Secondary Dressing: ComfortFoam Border, 4x4 in (silicone border) [facility]) Every Other Day/30 Days Discharge Instructions: Apply over primary dressing as directed. 1. Marked regression of this wound 3 weeks ago with wide areas of exposed bone. For 1 reason or  another the CT scan we ordered was not done apparently the radiology could not contact the patient the patient says nobody called etc. She was supposed to come in here earlier than this for consideration of a biopsy 2. We did the biopsy of bone for pathology and CNS today 3. My expectation is that she will end up going to see infectious disease. 4. Still using silver collagen on the wound Electronic Signature(s) Signed: 10/25/2020 5:24:41 PM By: Linton Ham MD Entered By: Linton Ham on 10/25/2020 12:27:38 -------------------------------------------------------------------------------- SuperBill Details Patient Name: Date of Service: [name] 10/25/2020 Medical  Record Number: [id number] Patient Account Number: [id number] Date of Birth/Sex: Treating RN: 12-16-1950 (70 y.o. [name], [name] Primary Care Provider: [name] Other Clinician: Referring Provider: Treating Provider/Extender: Tommy Rainwater in Treatment: 95 Diagnosis Coding ICD-10 Codes Code Description [phone] Pressure ulcer of left buttock, stage 4 E11.622 Type 2 diabetes mellitus with other skin ulcer Facility Procedures The patient participates with Medicare or their insurance follows the Medicare Facility Guidelines: CPT4 Code Description Modifier Quantity [phone] 11044 - DEB BONE 20 SQ CM/< 1 ICD-10 Diagnosis Description 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, stage  4 Physician Procedures CPT4: Description Modifier Code [id number] Debridement; bone (includes epidermis, dermis, subQ tissue, muscle and/or fascia, if performed) 1st 20 sqcm or less ICD-10 Diagnosis Description 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, stage 4 Quantity: 1 Electronic Signature(s) Signed: 10/25/2020 5:24:41 PM By: Linton Ham MD Entered By: Linton Ham on 10/25/2020 12:27:52

## 2020-10-29 NOTE — Progress Notes (Signed)
VERBAL, HOLSTEN ([id number]) Visit Report for 10/25/2020 Arrival Information Details Patient Name: Date of Service: [name], [name] 10/25/2020 9:15 A M Medical Record Number: [id number] Patient Account Number: [id number] Date of Birth/Sex: Treating RN: [birth year] (70 y.o. [name], Briant Cedar Primary Care Lincon Sahlin: [name] Other Clinician: Referring Tyjai Matuszak: Treating Shaneil Yazdi/Extender: Tommy Rainwater in Treatment: 95 Visit Information History Since Last Visit Added or deleted any medications: No Patient Arrived: Ambulatory Any new allergies or adverse reactions: No Arrival Time: 10:35 Had a fall or experienced change in No Accompanied By: husband activities of daily living that may affect Transfer Assistance: Manual risk of falls: Patient Identification Verified: Yes Signs or symptoms of abuse/neglect since last visito No Secondary Verification Process Completed: Yes Hospitalized since last visit: No Patient Requires Transmission-Based Precautions: No Implantable device outside of the clinic excluding No Patient Has Alerts: No cellular tissue based products placed in the center since last visit: Has Dressing in Place as Prescribed: Yes Pain Present Now: No Electronic Signature(s) Signed: 10/29/2020 4:44:07 PM By: Levan Hurst RN, BSN Entered By: Levan Hurst on 10/25/2020 10:35:46 -------------------------------------------------------------------------------- Encounter Discharge Information Details Patient Name: Date of Service: [name]. 10/25/2020 9:15 A M Medical Record Number: [id number] Patient Account Number: [id number] Date of Birth/Sex: Treating RN: 1951/01/26 (70 y.o. [name] Primary Care Margie Brink: [name] Other Clinician: Referring Tayon Parekh: Treating Amaryah Mallen/Extender: Tommy Rainwater in Treatment: 95 Encounter Discharge Information Items Post Procedure Vitals Discharge Condition:  Stable Temperature (F): 98.0 Ambulatory Status: Wheelchair Pulse (bpm): 82 Discharge Destination: Home Respiratory Rate (breaths/min): 16 Transportation: Private Auto Blood Pressure (mmHg): 117/77 Accompanied By: husband Schedule Follow-up Appointment: Yes Clinical Summary of Care: Patient Declined Electronic Signature(s) Signed: 10/29/2020 4:44:07 PM By: Levan Hurst RN, BSN Entered By: Levan Hurst on 10/25/2020 13:28:51 -------------------------------------------------------------------------------- Multi Wound Chart Details Patient Name: Date of Service: [name]. 10/25/2020 9:15 A M Medical Record Number: [id number] Patient Account Number: [id number] Date of Birth/Sex: Treating RN: Mar 16, 1951 (70 y.o. [name], Meta.Reding Primary Care Iliany Losier: [name] Other Clinician: Referring Palmira Stickle: Treating Leobardo Granlund/Extender: Tommy Rainwater in Treatment: 95 Vital Signs Height(in): 63 Pulse(bpm): 82 Weight(lbs): 185 Blood Pressure(mmHg): 117/77 Body Mass Index(BMI): 33 Temperature(F): 98.0 Respiratory Rate(breaths/min): 16 Photos: [12:No Photos Left Ischial Tuberosity] [N/A:N/A N/A] Wound Location: [12:Pressure Injury] [N/A:N/A] Wounding Event: [12:Pressure Ulcer] [N/A:N/A] Primary Etiology: [12:Anemia, Hypertension, Type II] [N/A:N/A] Comorbid History: [12:Diabetes, Osteoarthritis, Dementia, Paraplegia, Received Radiation 09/06/2018] [N/A:N/A] Date Acquired: [12:95] [N/A:N/A] Weeks of Treatment: [12:Open] [N/A:N/A] Wound Status: [12:3x1x1] [N/A:N/A] Measurements L x W x D (cm) [12:2.356] [N/A:N/A] A (cm) : rea [12:2.356] [N/A:N/A] Volume (cm) : [12:-898.30%] [N/A:N/A] % Reduction in A [12:rea: -1011.30%] [N/A:N/A] % Reduction in Volume: [12:Category/Stage IV] [N/A:N/A] Classification: [12:Medium] [N/A:N/A] Exudate A mount: [12:Serosanguineous] [N/A:N/A] Exudate Type: [12:red, brown] [N/A:N/A] Exudate Color: [12:Well defined, not attached]  [N/A:N/A] Wound Margin: [12:Large (67-100%)] [N/A:N/A] Granulation A mount: [12:Red, Pink] [N/A:N/A] Granulation Quality: [12:Small (1-33%)] [N/A:N/A] Necrotic A mount: [12:Fat Layer (Subcutaneous Tissue): Yes N/A] Exposed Structures: [12:Fascia: No Tendon: No Muscle: No Joint: No Bone: No Small (1-33%)] [N/A:N/A] Epithelialization: [12:Debridement - Excisional] [N/A:N/A] Debridement: Pre-procedure Verification/Time Out 11:00 [N/A:N/A] Taken: [12:Lidocaine 4% Topical Solution] [N/A:N/A] Pain Control: [12:Bone] [N/A:N/A] Tissue Debrided: [12:Skin/Subcutaneous] [N/A:N/A] Level: [12:Tissue/Muscle/Bone 0.25] [N/A:N/A] Debridement A (sq cm): [12:rea Rongeur] [N/A:N/A] Instrument: [12:Swab] [N/A:N/A] Specimen: [12:2] [N/A:N/A] Number of Specimens Taken: [12:Minimum] [N/A:N/A] Bleeding: [12:0] [N/A:N/A] Procedural Pain: [12:0] [N/A:N/A] Post Procedural Pain: Debridement Treatment Response: Procedure was tolerated well [N/A:N/A]  Post Debridement Measurements L x 3x1x1 [N/A:N/A] W x D (cm) [12:2.356] [N/A:N/A] Post Debridement Volume: (cm) [12:Category/Stage IV] [N/A:N/A] Post Debridement Stage: [12:Debridement] [N/A:N/A] Treatment Notes Electronic Signature(s) Signed: 10/25/2020 5:24:41 PM By: Linton Ham MD Signed: 10/25/2020 6:31:47 PM By: Deon Pilling Entered By: Linton Ham on 10/25/2020 12:12:06 -------------------------------------------------------------------------------- Multi-Disciplinary Care Plan Details Patient Name: Date of Service: [name], [name]. 10/25/2020 9:15 A M Medical Record Number: [id number] Patient Account Number: [id number] Date of Birth/Sex: Treating RN: 04/08/1950 (70 y.o. [name], [name] Primary Care Alleen Kehm: [name] Other Clinician: Referring Hasset Chaviano: Treating Sayward Horvath/Extender: Tommy Rainwater in Treatment: 95 Active Inactive Wound/Skin Impairment Nursing Diagnoses: Knowledge deficit related to  ulceration/compromised skin integrity Goals: Patient/caregiver will verbalize understanding of skin care regimen Date Initiated: 12/30/2018 Target Resolution Date: 12/06/2020 Goal Status: Active Interventions: Assess patient/caregiver ability to obtain necessary supplies Assess patient/caregiver ability to perform ulcer/skin care regimen upon admission and as needed Assess ulceration(s) every visit Provide education on ulcer and skin care Treatment Activities: Skin care regimen initiated : 12/30/2018 Topical wound management initiated : 12/30/2018 Notes: Electronic Signature(s) Signed: 10/25/2020 6:31:47 PM By: Deon Pilling Entered By: Deon Pilling on 10/25/2020 10:41:50 -------------------------------------------------------------------------------- Pain Assessment Details Patient Name: Date of Service: SALIA, ABUNDIZ 10/25/2020 9:15 A M Medical Record Number: [id number] Patient Account Number: [id number] Date of Birth/Sex: Treating RN: 1951-02-09 (70 y.o. [name] Primary Care Teri Legacy: [name] Other Clinician: Referring Billal Rollo: Treating Carlissa Pesola/Extender: Tommy Rainwater in Treatment: 95 Active Problems Location of Pain Severity and Description of Pain Patient Has Paino No Site Locations Pain Management and Medication Current Pain Management: Electronic Signature(s) Signed: 10/29/2020 4:44:07 PM By: Levan Hurst RN, BSN Entered By: Levan Hurst on 10/25/2020 10:36:24 -------------------------------------------------------------------------------- Patient/Caregiver Education Details Patient Name: Date of Service: [name] 7/21/2022andnbsp9:15 [place] Record Number: [id number] Patient Account Number: [id number] Date of Birth/Gender: Treating RN: Oct 25, 1950 (70 y.o. [name], [name] Primary Care Physician: [name] Other Clinician: Referring Physician: Treating Physician/Extender: Tommy Rainwater in  Treatment: 95 Education Assessment Education Provided To: Patient Education Topics Provided Wound/Skin Impairment: Handouts: Skin Care Do's and Dont's Methods: Explain/Verbal Responses: Reinforcements needed Electronic Signature(s) Signed: 10/25/2020 6:31:47 PM By: Deon Pilling Entered By: Deon Pilling on 10/25/2020 10:42:22 -------------------------------------------------------------------------------- Wound Assessment Details Patient Name: Date of Service: [name]. 10/25/2020 9:15 A M Medical Record Number: [id number] Patient Account Number: [id number] Date of Birth/Sex: Treating RN: [birth year] (70 y.o. [name] Primary Care Rether Rison: [name] Other Clinician: Referring Jakki Doughty: Treating Lanis Storlie/Extender: Tommy Rainwater in Treatment: 95 Wound Status Wound Number: 12 Primary Pressure Ulcer Etiology: Wound Location: Left Ischial Tuberosity Wound Open Wounding Event: Pressure Injury Status: Date Acquired: 09/06/2018 Comorbid Anemia, Hypertension, Type II Diabetes, Osteoarthritis, Weeks Of Treatment: 95 History: Dementia, Paraplegia, Received Radiation Clustered Wound: No Photos Photo Uploaded By: Donavan Burnet on 10/26/2020 08:59:09 Wound Measurements Length: (cm) 3 Width: (cm) 1 Depth: (cm) 1 Area: (cm) 2.356 Volume: (cm) 2.356 % Reduction in Area: -898.3% % Reduction in Volume: -1011.3% Epithelialization: Small (1-33%) Tunneling: No Undermining: No Wound Description Classification: Category/Stage IV Wound Margin: Well defined, not attached Exudate Amount: Medium Exudate Type: Serosanguineous Exudate Color: red, brown Foul Odor After Cleansing: No Slough/Fibrino Yes Wound Bed Granulation Amount: Large (67-100%) Exposed Structure Granulation Quality: Red, Pink Fascia Exposed: No Necrotic Amount: Small (1-33%) Fat Layer (Subcutaneous Tissue) Exposed: Yes Necrotic Quality: Adherent Slough Tendon Exposed:  No Muscle Exposed:  No Joint Exposed: No Bone Exposed: No Electronic Signature(s) Signed: 10/29/2020 4:44:07 PM By: Levan Hurst RN, BSN Entered By: Levan Hurst on 10/25/2020 10:37:16 -------------------------------------------------------------------------------- Vitals Details Patient Name: Date of Service: [name], [name]. 10/25/2020 9:15 A M Medical Record Number: [id number] Patient Account Number: [id number] Date of Birth/Sex: Treating RN: May 06, 1950 (70 y.o. [name], Briant Cedar Primary Care Olga Bourbeau: [name] Other Clinician: Referring Greer Wainright: Treating Ysabella Babiarz/Extender: Tommy Rainwater in Treatment: 95 Vital Signs Time Taken: 10:35 Temperature (F): 98.0 Height (in): 63 Pulse (bpm): 82 Weight (lbs): 185 Respiratory Rate (breaths/min): 16 Body Mass Index (BMI): 32.8 Blood Pressure (mmHg): 117/77 Reference Range: 80 - 120 mg / dl Electronic Signature(s) Signed: 10/29/2020 4:44:07 PM By: Levan Hurst RN, BSN Entered By: Levan Hurst on 10/25/2020 10:36:16

## 2020-10-31 LAB — AEROBIC/ANAEROBIC CULTURE W GRAM STAIN (SURGICAL/DEEP WOUND): Gram Stain: NONE SEEN

## 2020-11-02 ENCOUNTER — Other Ambulatory Visit: Payer: Self-pay

## 2020-11-02 ENCOUNTER — Ambulatory Visit (HOSPITAL_COMMUNITY)
Admission: RE | Admit: 2020-11-02 | Discharge: 2020-11-02 | Disposition: A | Discharge status: Home / Self Care | Payer: Medicare Other | Source: Ambulatory Visit | Attending: Internal Medicine | Admitting: Internal Medicine

## 2020-11-02 DIAGNOSIS — M86652 Other chronic osteomyelitis, left thigh: Secondary | ICD-10-CM | POA: Insufficient documentation

## 2020-11-02 DIAGNOSIS — R9389 Abnormal findings on diagnostic imaging of other specified body structures: Secondary | ICD-10-CM | POA: Diagnosis not present

## 2020-11-02 DIAGNOSIS — M160 Bilateral primary osteoarthritis of hip: Secondary | ICD-10-CM | POA: Diagnosis not present

## 2020-11-02 DIAGNOSIS — L89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, stage 4: Secondary | ICD-10-CM | POA: Diagnosis present

## 2020-11-06 ENCOUNTER — Telehealth: Payer: Self-pay

## 2020-11-06 NOTE — Telephone Encounter (Signed)
Received call from [facility] at the wound center. States they have sent over 2 urgent referrals. RN provided her with contact information for referral coordinator.    Beryle Flock, RN

## 2020-11-07 ENCOUNTER — Encounter (HOSPITAL_BASED_OUTPATIENT_CLINIC_OR_DEPARTMENT_OTHER): Payer: Medicare Other | Admitting: Physician Assistant

## 2020-11-08 ENCOUNTER — Other Ambulatory Visit (HOSPITAL_COMMUNITY): Payer: Self-pay

## 2020-11-08 ENCOUNTER — Ambulatory Visit: Payer: Medicare Other | Admitting: Infectious Disease

## 2020-11-08 ENCOUNTER — Other Ambulatory Visit: Payer: Self-pay

## 2020-11-08 ENCOUNTER — Encounter: Payer: Self-pay | Admitting: Infectious Disease

## 2020-11-08 VITALS — BP 174/79 | HR 87

## 2020-11-08 DIAGNOSIS — C50311 Malignant neoplasm of lower-inner quadrant of right female breast: Secondary | ICD-10-CM

## 2020-11-08 DIAGNOSIS — G8220 Paraplegia, unspecified: Secondary | ICD-10-CM | POA: Diagnosis not present

## 2020-11-08 DIAGNOSIS — M86652 Other chronic osteomyelitis, left thigh: Secondary | ICD-10-CM | POA: Diagnosis not present

## 2020-11-08 DIAGNOSIS — A491 Streptococcal infection, unspecified site: Secondary | ICD-10-CM

## 2020-11-08 DIAGNOSIS — M4804 Spinal stenosis, thoracic region: Secondary | ICD-10-CM

## 2020-11-08 DIAGNOSIS — I10 Essential (primary) hypertension: Secondary | ICD-10-CM | POA: Diagnosis not present

## 2020-11-08 DIAGNOSIS — E1165 Type 2 diabetes mellitus with hyperglycemia: Secondary | ICD-10-CM

## 2020-11-08 DIAGNOSIS — M4712 Other spondylosis with myelopathy, cervical region: Secondary | ICD-10-CM

## 2020-11-08 HISTORY — DX: Streptococcal infection, unspecified site: A49.1

## 2020-11-08 MED ORDER — AMOXICILLIN-POT CLAVULANATE 875-125 MG PO TABS: 1.0000 | ORAL_TABLET | Freq: Two times a day (BID) | ORAL | 1 refills | Status: DC

## 2020-11-08 NOTE — Progress Notes (Signed)
Chief complaint: Patient found to have osteomyelitis by biopsy with group B streptococcus isolated  Requesting Physician: Dr. Dellia Nims  Subjective:    Patient [name], female    DOB: [birth year], 70 y.o.   MRN: [id number]  HPI  [name] is an 70 year old black woman with a history of cervical myelopathy status post cervical spine surgery which unfortunately resulted in paraplegia paraplegia.  S  He has had problems with recurrent pressure ulcers in her sacral area over the last 6 years.  She had been hospitalized January 2019 underwent debridement.  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  She also was evaluated by plastic surgery at [facility].  A flap closure of the wound was planned but never performed.  She had comorbid controlled diabetes at that time.  She did stop smoke cigarettes.  Wound eventually closed earlier that year and she had been discharged from the wound care center.  She is then hospitalized again in June 2020 with Klebsiella UTI and bacteremia.  CT scan done on September 25, 2018 showed irregular appearance of the left ischium concerning for osteomyelitis.  Was very similar to findings on CT done in January 2019.  She was seen by Dr. Linus Salmons my partner I did not find that there was clinical evidence for infection I recommended focusing on the gram-negative bacteremia from urinary source.  That fall she then developed a new area on her buttocks that opened up she went back to the wound care center on September 24 underwent debridement.  She returns wound care center on January 13, 2019 Dr. Dellia Nims noted palpable bone of the base of the wound and performed a biopsy grams into the bone did not show organism cultures grew methicillin sensitive Staph capitis and Enterococcus.  Patient was seen by my partner Dr. Megan Salon who after thorough discussion with the patient and  husband opted to treat her with 6 weeks of Augmentin.  According to Dr. Hale Bogus notes the wound had not changed a great deal at that time but had shown some improvement and her inflammatory markers were normal he continued an additional 2 weeks of Augmentin in December with plans for her to see him again in January 2021.  According to the patient and the husband the area that Dr. Christella Noa and been treating did heal up eventually.  However since then a new ulcer opened up in a very similar location.  She has been followed very closely by the wound care center by Dr. Heber [place] and Dr. Dellia Nims.  Lee the wound was not closing.  CT scan was performed on November 02, 2020 which I personally reviewed and does show a left-sided ulcer with findings concerning for osteomyelitis albeit more of a chronic 1 without changes compared to prior scan and no evidence of new bone destruction or abscess.  There is also some soft tissue thickening along with lower sacrum. More recently Dr. Dellia Nims apparently biopsied the palpable bone and this was taken to pathology lab.  Insistent with osteomyelitis a bone culture was also taken which yielded group B streptococcus.  The patient was placed on Augmentin and referred urgently to our clinic.  Talking the patient she does not have any systemic symptoms of fevers chills malaise nausea dizziness lightheadedness.  The husband and the patient indicated that she has not had increasing drainage or purulent material coming from the wound.  They believe that largely the reason  that the biopsies were performed was that the wound simply is failing to close.  I raise the question about whether diversion colostomy had been considered and if that seen plastic surgery recently.  She is very much against diversion colostomy.  She is interested in seeing plastic surgery again however.  She lives at home and has a bed that is supposed to relieve the pressure but she says does not work very  well.  Her husband is very diligent with her wound care but they both agree that him trying to turn her every 2 hours is not possible.      Past Medical History:  Diagnosis Date   Anemia    Arthritis    "all over" (04/13/2012)   Cancer of right breast ([place]) 2011   Cervical neuropathy    PERIPHERAL NEUROPATHY , HAD CERVICAL FUSION   Diabetes mellitus without complication ([place])    Difficult intubation    Fastrach #4 LMA then # 7 ETT used in 2006 [facility], cervical laminectomy)   GERD (gastroesophageal reflux disease)    History of kidney stones    Hypercholesteremia    Hypertension    Hypothyroidism    Kidney stones    Neurogenic bladder    Osteomyelitis (HCC)    Paraplegia (HCC)    Peripheral neuropathy    PONV (postoperative nausea and vomiting)    Sacral decubitus ulcer 10/2014   Sepsis due to urinary tract infection [facility])     Past Surgical History:  Procedure Laterality Date   ABDOMINAL HYSTERECTOMY  ~ [place] Right 2011   BREAST LUMPECTOMY Right 2011   CALDWELL LUC  ~ 2003   "benign tumor removed from up under gum" (04/13/2012)   DACROCYSTORHINOSTOMY  ~ 2000   "put stents in my tear ducts; both eyes" (04/13/2012)   EYE SURGERY  2004   STENTS TO BIL EYES   I & D EXTREMITY Bilateral 11/03/2014   Procedure: IRRIGATION AND DEBRIDEMENT BILATERAL HEEL;  Surgeon: Irene Limbo, MD;  Location: [place];  Service: Plastics;  Laterality: Bilateral;   INCISION AND DRAINAGE OF WOUND Left 11/03/2014   Procedure: IRRIGATION AND DEBRIDEMENT SACRAL ULCER;  Surgeon: Irene Limbo, MD;  Location: [place];  Service: Plastics;  Laterality: Left;   JOINT REPLACEMENT     POSTERIOR CERVICAL LAMINECTOMY  2006   SKIN SPLIT GRAFT Left 08/05/2016   Procedure: SURGICAL PREP  FOR GRAFTING APPLICATION ACELL TO LEFT ISCHIUM;  Surgeon: Irene Limbo, MD;  Location: [place];  Service: Plastics;  Laterality: Left;   TONSILLECTOMY AND ADENOIDECTOMY  ~ 1954   TOTAL KNEE  ARTHROPLASTY WITH REVISION COMPONENTS  04/13/2012   Procedure: TOTAL KNEE ARTHROPLASTY WITH REVISION COMPONENTS;  Surgeon: Hessie Dibble, MD;  Location: [place];  Service: Orthopedics;  Laterality: Left;    Family History  Problem Relation Age of Onset   Heart disease Father       Social History   Socioeconomic History   Marital status: Married    Spouse name: Not on file   Number of children: Not on file   Years of education: Not on file   Highest education level: Not on file  Occupational History   Not on file  Tobacco Use   Smoking status: Former    Packs/day: 0.50    Years: 50.00    Pack years: 25.00    Types: Cigarettes    Quit date: 11/06/2014    Years since quitting:  6.0   Smokeless tobacco: Never  Substance and Sexual Activity   Alcohol use: No   Drug use: No   Sexual activity: Not Currently  Other Topics Concern   Not on file  Social History Narrative   Not on file   Social Determinants of Health   Financial Resource Strain: Not on file  Food Insecurity: Not on file  Transportation Needs: Not on file  Physical Activity: Not on file  Stress: Not on file  Social Connections: Not on file    Allergies  Allergen Reactions   Ivp Dye [Iodinated Diagnostic Agents] Other (See Comments)    "hot and sweaty and almost passed out"   Aspirin Hives   Sulfa Antibiotics Hives     Current Outpatient Medications:    amoxicillin-clavulanate (AUGMENTIN) 500-125 MG tablet, Take 1 tablet (500 mg total) by mouth 2 (two) times daily., Disp: 60 tablet, Rfl: 1   ascorbic acid (VITAMIN C) 500 MG tablet, Take 1 tablet (500 mg total) by mouth daily., Disp: 30 tablet, Rfl: 12   baclofen (LIORESAL) 20 MG tablet, Take 20 mg by mouth every 6 (six) hours as needed for muscle spasms. , Disp: , Rfl: 0   bisacodyl (DULCOLAX) 10 MG suppository, Place 1 suppository (10 mg total) rectally daily as needed for severe constipation., Disp: 12 suppository, Rfl: 0   [employer] SENNA 8.6 MG tablet, TAKE  2 TABLETS BY MOUTH AT BEDTIME AS NEEDED, Disp: , Rfl:    docusate sodium (COLACE) 100 MG capsule, Take 100 mg by mouth 2 (two) times daily as needed (FOR CONSTIPATION). , Disp: , Rfl:    DULoxetine (CYMBALTA) 60 MG capsule, Take 120 mg by mouth daily. , Disp: , Rfl:    ferrous sulfate 325 (65 FE) MG tablet, Take 325 mg by mouth daily after supper. , Disp: , Rfl:    folic acid (FOLVITE) 1 MG tablet, Take 1 mg by mouth daily. , Disp: , Rfl:    furosemide (LASIX) 40 MG tablet, '80mg'$  in AM and '40mg'$  in PM till 09/26/18, then '80mg'$  in AM from 09/27/18 till 10/03/18, then '40mg'$  in AM from 10/04/18 onwards, Disp: 60 tablet, Rfl: 0   irbesartan (AVAPRO) 300 MG tablet, Take 300 mg by mouth daily., Disp: , Rfl:    lactulose (CHRONULAC) 10 GM/15ML solution, TAKE 30 MILLILITERS BY MOUTH FOR 1 DOSE, Disp: , Rfl:    levothyroxine (SYNTHROID, LEVOTHROID) 125 MCG tablet, Take 1 tablet (125 mcg total) by mouth daily before breakfast. (Patient taking differently: Take 125 mcg by mouth daily. ), Disp: 30 tablet, Rfl: 0   metFORMIN (GLUCOPHAGE) 1000 MG tablet, Take 1,000 mg by mouth 2 (two) times daily., Disp: , Rfl:    metFORMIN (GLUCOPHAGE) 500 MG tablet, Take 250 mg by mouth 2 (two) times daily with a meal., Disp: , Rfl:    metoprolol (LOPRESSOR) 100 MG tablet, Take 100 mg by mouth 2 (two) times daily. , Disp: , Rfl:    mometasone-formoterol (DULERA) 100-5 MCG/ACT AERO, Inhale 2 puffs into the lungs 2 (two) times daily., Disp: 1 g, Rfl: 0   Multiple Vitamin (MULTIVITAMIN WITH MINERALS) TABS tablet, Take 1 tablet by mouth daily., Disp: 30 tablet, Rfl: 12   NIFEdipine (ADALAT CC) 30 MG 24 hr tablet, Take 30 mg by mouth daily., Disp: , Rfl:    NIFEdipine (PROCARDIA XL/ADALAT-CC) 30 MG 24 hr tablet, Take 30 mg by mouth daily after supper. , Disp: , Rfl:    oxyCODONE-acetaminophen (PERCOCET) 10-325 MG tablet, Take 1  tablet by mouth every 6 (six) hours as needed for pain. , Disp: , Rfl:    polyethylene glycol (MIRALAX / GLYCOLAX)  17 g packet, Take 17 g by mouth 2 (two) times daily., Disp: 30 each, Rfl: 0   pregabalin (LYRICA) 75 MG capsule, Take 75 mg by mouth 2 (two) times daily. , Disp: , Rfl:    senna-docusate (SENOKOT-S) 8.6-50 MG tablet, Take 1 tablet by mouth 2 (two) times daily., Disp: 30 tablet, Rfl: 0   zinc sulfate 220 MG capsule, Take 1 capsule (220 mg total) by mouth daily., Disp: 30 capsule, Rfl: 12   Review of Systems  Constitutional:  Negative for activity change, appetite change, chills, diaphoresis, fatigue, fever and unexpected weight change.  HENT:  Negative for congestion, facial swelling, hearing loss, mouth sores, nosebleeds, postnasal drip, rhinorrhea, sinus pressure, sinus pain, sore throat, trouble swallowing and voice change.   Eyes:  Positive for discharge. Negative for photophobia.  Respiratory:  Negative for cough, choking, chest tightness, shortness of breath and wheezing.   Cardiovascular:  Negative for chest pain, palpitations and leg swelling.  Gastrointestinal:  Negative for abdominal pain, blood in stool, constipation, diarrhea, nausea and vomiting.  Genitourinary:  Negative for hematuria.  Musculoskeletal:  Negative for back pain and myalgias.  Skin:  Positive for wound. Negative for color change, pallor and rash.  Neurological:  Negative for dizziness and headaches.  Hematological:  Does not bruise/bleed easily.  Psychiatric/Behavioral:  Negative for agitation, behavioral problems, confusion, decreased concentration, dysphoric mood, hallucinations and self-injury. The patient is not nervous/anxious and is not hyperactive.    Wound 11/08/2020:       Objective:   Physical Exam Constitutional:      General: She is not in acute distress.    Appearance: She is not diaphoretic.  HENT:     Head: Normocephalic and atraumatic.     Right Ear: External ear normal.     Left Ear: External ear normal.     Nose: Nose normal.     Mouth/Throat:     Pharynx: No oropharyngeal exudate.   Eyes:     General: No scleral icterus.       Right eye: No discharge.        Left eye: No discharge.     Extraocular Movements: Extraocular movements intact.     Conjunctiva/sclera: Conjunctivae normal.  Cardiovascular:     Rate and Rhythm: Normal rate and regular rhythm.  Pulmonary:     Effort: Pulmonary effort is normal. No respiratory distress.     Breath sounds: No wheezing or rales.  Abdominal:     General: There is no distension.     Palpations: Abdomen is soft.  Musculoskeletal:        General: No tenderness. Normal range of motion.     Cervical back: Normal range of motion and neck supple.  Lymphadenopathy:     Cervical: No cervical adenopathy.  Skin:    General: Skin is warm and dry.     Coloration: Skin is not jaundiced or pale.     Findings: No erythema, lesion or rash.  Neurological:     Mental Status: She is alert and oriented to person, place, and time.  Psychiatric:        Mood and Affect: Mood normal.        Behavior: Behavior normal.        Thought Content: Thought content normal.        Judgment: Judgment normal.  Ischial wound 11/07/2020       Assessment & Plan:   Ischial osteomyelitis:  She is certainly not acutely ill or or with evidence of systemic illness.  I think most of this is largely still chronic osteomyelitis.  We discussed different treatment modalities including IV therapy with antibiotics versus oral antibiotics.  I think given her current condition I would think that oral antibiotic should be certainly a reasonable approach particularly ones that are bioavailable such as the Augmentin that she is taking.  Will refer back to plastic surgery to see if there is anything else that can be done from a plastic surgery perspective.  Clearly offloading this area is critical and is difficult if not impossible to do to the level that needs to be done at home.  No further equipment could be procured she has several different wheelchairs  but the one that she can move around in the past is one that her husband cannot get her in and out of.  Obviously nutritional optimization is critical and plastic surgery could be an option but not likely to be 1 if offloading cannot be properly done.  I did mention also diverting colostomy to minimize risk of stool or bacteria from the got getting into this wound.  Husband did tell me that they are very diligent about cleaning her off if she does have a large bowel movement.  Again she is against a diverting colostomy.  I am ordering a sed rate CRP metabolic panel and CBC with differential.  We will continue Augmentin I will increase the dose to 875/125 based on renal function that was checked in the computer here last.  I plan on seeing her back in roughly 5 weeks time.  Will continue to follow-up closely with Dr. Dellia Nims.  Paraplegia: see above re needs for management of wound and risk of new wounds  Hypertension: Not well controlled. Defer to primary  Vitals:   11/08/20 0853  BP: (!) 174/79  Pulse: 87    Diabetes: Last A1c in our system was 10 but that was from 2 years ago.  I spent 42 minutes with the patient including  face to face counseling of the patient and her husband regarding the nature of osteomyelitis decubitus ulcers in the context of paraplegia, personally reviewing her CT scan from November 02, 2020, Dr. Janalyn Rouse notes Dr. Hale Bogus notes her microbiological data from biopsy showing group B streptococcus, in review of medical records in preparation for the visit and during the visit and in coordination of her care.

## 2020-11-09 LAB — CBC WITH DIFFERENTIAL/PLATELET
Absolute Monocytes: 430 cells/uL (ref 200–950)
Basophils Absolute: 40 cells/uL (ref 0–200)
Basophils Relative: 0.8 %
Eosinophils Absolute: 140 cells/uL (ref 15–500)
Eosinophils Relative: 2.8 %
HCT: 40 % (ref 35.0–45.0)
Hemoglobin: 13.2 g/dL (ref 11.7–15.5)
Lymphs Abs: 1755 cells/uL (ref 850–3900)
MCH: 30.6 pg (ref 27.0–33.0)
MCHC: 33 g/dL (ref 32.0–36.0)
MCV: 92.6 fL (ref 80.0–100.0)
MPV: 10.3 fL (ref 7.5–12.5)
Monocytes Relative: 8.6 %
Neutro Abs: 2635 cells/uL (ref 1500–7800)
Neutrophils Relative %: 52.7 %
Platelets: 299 10*3/uL (ref 140–400)
RBC: 4.32 10*6/uL (ref 3.80–5.10)
RDW: 12.4 % (ref 11.0–15.0)
Total Lymphocyte: 35.1 %
WBC: 5 10*3/uL (ref 3.8–10.8)

## 2020-11-09 LAB — BASIC METABOLIC PANEL WITH GFR
BUN/Creatinine Ratio: 15 (calc) (ref 6–22)
BUN: 20 mg/dL (ref 7–25)
CO2: 27 mmol/L (ref 20–32)
Calcium: 9.6 mg/dL (ref 8.6–10.4)
Chloride: 101 mmol/L (ref 98–110)
Creat: 1.31 mg/dL — ABNORMAL HIGH (ref 0.60–1.00)
Glucose, Bld: 92 mg/dL (ref 65–99)
Potassium: 4.4 mmol/L (ref 3.5–5.3)
Sodium: 140 mmol/L (ref 135–146)
eGFR: 43 mL/min/{1.73_m2} — ABNORMAL LOW (ref >=60)

## 2020-11-09 LAB — C-REACTIVE PROTEIN: CRP: 7 mg/L (ref <8.0)

## 2020-11-09 LAB — SEDIMENTATION RATE: Sed Rate: 11 mm/h (ref 0–30)

## 2020-11-19 ENCOUNTER — Emergency Department (HOSPITAL_COMMUNITY): Payer: Medicare Other

## 2020-11-19 ENCOUNTER — Other Ambulatory Visit: Payer: Self-pay

## 2020-11-19 ENCOUNTER — Emergency Department (HOSPITAL_COMMUNITY)
Admission: EM | Admit: 2020-11-19 | Discharge: 2020-11-20 | Disposition: A | Discharge status: Home / Self Care | Payer: Medicare Other | Attending: Emergency Medicine | Admitting: Emergency Medicine

## 2020-11-19 DIAGNOSIS — R1084 Generalized abdominal pain: Secondary | ICD-10-CM

## 2020-11-19 DIAGNOSIS — Z7984 Long term (current) use of oral hypoglycemic drugs: Secondary | ICD-10-CM | POA: Diagnosis not present

## 2020-11-19 DIAGNOSIS — Z96652 Presence of left artificial knee joint: Secondary | ICD-10-CM | POA: Insufficient documentation

## 2020-11-19 DIAGNOSIS — Z853 Personal history of malignant neoplasm of breast: Secondary | ICD-10-CM | POA: Insufficient documentation

## 2020-11-19 DIAGNOSIS — R319 Hematuria, unspecified: Secondary | ICD-10-CM

## 2020-11-19 DIAGNOSIS — Z87891 Personal history of nicotine dependence: Secondary | ICD-10-CM | POA: Insufficient documentation

## 2020-11-19 DIAGNOSIS — E039 Hypothyroidism, unspecified: Secondary | ICD-10-CM | POA: Diagnosis not present

## 2020-11-19 DIAGNOSIS — I10 Essential (primary) hypertension: Secondary | ICD-10-CM | POA: Insufficient documentation

## 2020-11-19 DIAGNOSIS — R000 Tachycardia, unspecified: Secondary | ICD-10-CM | POA: Diagnosis not present

## 2020-11-19 DIAGNOSIS — N390 Urinary tract infection, site not specified: Secondary | ICD-10-CM

## 2020-11-19 DIAGNOSIS — K439 Ventral hernia without obstruction or gangrene: Secondary | ICD-10-CM | POA: Insufficient documentation

## 2020-11-19 DIAGNOSIS — Z79899 Other long term (current) drug therapy: Secondary | ICD-10-CM | POA: Insufficient documentation

## 2020-11-19 DIAGNOSIS — E1140 Type 2 diabetes mellitus with diabetic neuropathy, unspecified: Secondary | ICD-10-CM | POA: Diagnosis not present

## 2020-11-19 DIAGNOSIS — R109 Unspecified abdominal pain: Secondary | ICD-10-CM | POA: Diagnosis present

## 2020-11-19 DIAGNOSIS — K5900 Constipation, unspecified: Secondary | ICD-10-CM

## 2020-11-19 LAB — URINALYSIS, ROUTINE W REFLEX MICROSCOPIC
Bilirubin Urine: NEGATIVE
Glucose, UA: NEGATIVE mg/dL
Hgb urine dipstick: NEGATIVE
Ketones, ur: NEGATIVE mg/dL
Nitrite: NEGATIVE
Protein, ur: 100 mg/dL — AB
Specific Gravity, Urine: 1.009 (ref 1.005–1.030)
pH: 7 (ref 5.0–8.0)

## 2020-11-19 LAB — CBC WITH DIFFERENTIAL/PLATELET
Abs Immature Granulocytes: 0.01 10*3/uL (ref 0.00–0.07)
Basophils Absolute: 0 10*3/uL (ref 0.0–0.1)
Basophils Relative: 0 %
Eosinophils Absolute: 0.1 10*3/uL (ref 0.0–0.5)
Eosinophils Relative: 2 %
HCT: 40.6 % (ref 36.0–46.0)
Hemoglobin: 12.4 g/dL (ref 12.0–15.0)
Immature Granulocytes: 0 %
Lymphocytes Relative: 31 %
Lymphs Abs: 1.7 10*3/uL (ref 0.7–4.0)
MCH: 30.2 pg (ref 26.0–34.0)
MCHC: 30.5 g/dL (ref 30.0–36.0)
MCV: 98.8 fL (ref 80.0–100.0)
Monocytes Absolute: 0.5 10*3/uL (ref 0.1–1.0)
Monocytes Relative: 10 %
Neutro Abs: 3.2 10*3/uL (ref 1.7–7.7)
Neutrophils Relative %: 57 %
Platelets: 278 10*3/uL (ref 150–400)
RBC: 4.11 MIL/uL (ref 3.87–5.11)
RDW: 13.5 % (ref 11.5–15.5)
WBC: 5.5 10*3/uL (ref 4.0–10.5)
nRBC: 0 % (ref 0.0–0.2)

## 2020-11-19 LAB — COMPREHENSIVE METABOLIC PANEL
ALT: 19 U/L (ref 0–44)
AST: 23 U/L (ref 15–41)
Albumin: 3.4 g/dL — ABNORMAL LOW (ref 3.5–5.0)
Alkaline Phosphatase: 42 U/L (ref 38–126)
Anion gap: 8 (ref 5–15)
BUN: 24 mg/dL — ABNORMAL HIGH (ref 8–23)
CO2: 28 mmol/L (ref 22–32)
Calcium: 9.3 mg/dL (ref 8.9–10.3)
Chloride: 105 mmol/L (ref 98–111)
Creatinine, Ser: 1.35 mg/dL — ABNORMAL HIGH (ref 0.44–1.00)
GFR, Estimated: 42 mL/min — ABNORMAL LOW (ref >60)
Glucose, Bld: 102 mg/dL — ABNORMAL HIGH (ref 70–99)
Potassium: 4.3 mmol/L (ref 3.5–5.1)
Sodium: 141 mmol/L (ref 135–145)
Total Bilirubin: 0.4 mg/dL (ref 0.3–1.2)
Total Protein: 7 g/dL (ref 6.5–8.1)

## 2020-11-19 LAB — LIPASE, BLOOD: Lipase: 28 U/L (ref 11–51)

## 2020-11-19 MED ORDER — DIPHENHYDRAMINE HCL 50 MG/ML IJ SOLN
25.0000 mg | Freq: Once | INTRAMUSCULAR | Status: AC
  Administered 2020-11-19: 25 mg via INTRAVENOUS
  Filled 2020-11-19: qty 1

## 2020-11-19 MED ORDER — ONDANSETRON HCL 4 MG/2ML IJ SOLN
4.0000 mg | Freq: Once | INTRAMUSCULAR | Status: AC
  Administered 2020-11-19: 4 mg via INTRAVENOUS
  Filled 2020-11-19: qty 2

## 2020-11-19 MED ORDER — BARIUM SULFATE 2.1 % PO SUSP
450.0000 mL | ORAL | Status: AC
Start: 2020-11-19 — End: 2020-11-19
  Administered 2020-11-19: 450 mL via ORAL

## 2020-11-19 MED ORDER — FLEET ENEMA 7-19 GM/118ML RE ENEM
1.0000 | ENEMA | Freq: Once | RECTAL | Status: AC
  Administered 2020-11-19: 1 via RECTAL
  Filled 2020-11-19: qty 1

## 2020-11-19 MED ORDER — CEPHALEXIN 500 MG PO CAPS: 500.0000 mg | ORAL_CAPSULE | Freq: Three times a day (TID) | ORAL | 0 refills | Status: DC

## 2020-11-19 MED ORDER — MORPHINE SULFATE (PF) 4 MG/ML IV SOLN
4.0000 mg | Freq: Once | INTRAVENOUS | Status: AC
  Administered 2020-11-19: 4 mg via INTRAVENOUS
  Filled 2020-11-19: qty 1

## 2020-11-19 NOTE — Discharge Instructions (Addendum)
Call your primary care doctor or specialist as discussed in the next 2-3 days.   Return immediately back to the ER if:  Your symptoms worsen within the next 12-24 hours. You develop new symptoms such as new fevers, persistent vomiting, new pain, shortness of breath, or new weakness or numbness, or if you have any other concerns.  

## 2020-11-19 NOTE — ED Provider Notes (Signed)
Emergency Medicine Provider Triage Evaluation Note  [name] , a 70 y.o. female  was evaluated in triage.  PT presents via EMS for abdominal pain and constipation. Reports that her abdomen has been hurting for about 6 days now. She states she last had a good BM over 2 weeks ago. She has been pooping small pelleted stool since then, last earlier today. She takes Miralax as well as Colace without success. Past surgical history includes hysterectomy. Pt is paraplegic s/2 spinal cord surgery.   Review of Systems  Positive: + abdominal pain, constipation Negative: - nausea or vomiting  Physical Exam  Ht '5\' 3"'$  (1.6 m)   BMI 31.89 kg/m  Gen:   Awake, no distress   Resp:  Normal effort  MSK:   Moves extremities without difficulty  Other:  Abdomen appears distended at this time. Decreased BS however still active. Diffuse TTP. No rebound or guarding.   Medical Decision Making  Medically screening exam initiated at 7:03 AM.  Appropriate orders placed.  TRU SAINZ was informed that the remainder of the evaluation will be completed by another provider, this initial triage assessment does not replace that evaluation, and the importance of remaining in the ED until their evaluation is complete.  Labs and xray of abdomen ordered.    Eustaquio Maize, PA-C 11/19/20 0703    Ripley Fraise, MD 11/19/20 [phone]

## 2020-11-19 NOTE — ED Notes (Addendum)
I was called to patients room for possible allergic reaction-itching to left arm and blisters noted to fingers. Patient with no SOB, no respiratory distress. Plunkett MD notified and benadryl ordered. Ring removed from left ring finger by ring cutter due to swelling and blistering

## 2020-11-19 NOTE — ED Notes (Signed)
Ptar update pt is now number 3 on the list

## 2020-11-19 NOTE — ED Provider Notes (Signed)
[facility] EMERGENCY DEPARTMENT Provider Note   CSN: [id number] Arrival date & time: 11/19/20  [id number]     History Chief Complaint  Patient presents with   Abdominal Pain    [name] TERPENNING is a 70 y.o. female.  Patient is a 70 year old female with a history of hypertension, paraplegia status post spinal surgery, sacral decubitus wound with osteomyelitis currently on amoxicillin, kidney stones, diabetes, recurrent UTIs status post suprapubic catheter who is presenting today with abdominal pain.  Patient reports for the last 3 weeks she has had intermittent hard stools with minimal output.  Husband reports because she is on chronic oxycodone she always has issues with her bowels but usually will have a bowel movement every 2 to 3 days.  However over the last 3 weeks she is only been passing small balls and in the last few days she has had worsening abdominal distention and abdominal pain.  This morning she reports the pain was so severe it felt like it was difficult for her to breathe because her abdomen was so distended.  She has had normal urine output and has been getting her suprapubic catheter changed monthly on schedule.  She has not had a fever, nausea or vomiting.  Her oral intake has not changed.  She has tried [employer] but has not had a successful bowel movement.  She is waited approximately 6 hours in the waiting room and reports she did feel some release on the right side of her abdomen a few hours ago and distention seem to improve on that side but she is still having pain in her left side of her abdomen.  She has not had cough or congestion.  She has taken her pain medicine around 10:00 her husband gave her a dose of her own medications.  Currently the pain is cramping and aching.  It does not radiate.  It is worse with palpation or movement.  She denies any blood in her stool.  The history is provided by the patient, the spouse and medical records.   Abdominal Pain     Past Medical History:  Diagnosis Date   Anemia    Arthritis    "all over" (04/13/2012)   Cancer of right breast ([place]) 2011   Cervical neuropathy    PERIPHERAL NEUROPATHY , HAD CERVICAL FUSION   Diabetes mellitus without complication ([place])    Difficult intubation    Fastrach #4 LMA then # 7 ETT used in 2006 [facility], cervical laminectomy)   GERD (gastroesophageal reflux disease)    Group B streptococcal infection 11/08/2020   History of kidney stones    Hypercholesteremia    Hypertension    Hypothyroidism    Kidney stones    Neurogenic bladder    Osteomyelitis (HCC)    Paraplegia (HCC)    Peripheral neuropathy    PONV (postoperative nausea and vomiting)    Sacral decubitus ulcer 10/2014   Sepsis due to urinary tract infection [facility])     Patient Active Problem List   Diagnosis Date Noted   Group B streptococcal infection 11/08/2020   Foley catheter in place 02/03/2019   GERD (gastroesophageal reflux disease) 02/03/2019   Hard to intubate 02/03/2019   Poor vision 02/03/2019   Former smoker 01/26/2019   Neuropathy 01/26/2019   Adjustment disorder with disturbance of emotion 12/02/2018   Palliative care by specialist    Advanced care planning/counseling discussion    Spondylosis, cervical, with myelopathy    Left perineal ischial  pressure ulcer 09/16/2018   Diabetes type 2, uncontrolled ([place]) 04/26/2017   Urinary tract infection associated with cystostomy catheter ([place])    Medication monitoring encounter 12/03/2015   Hypertension    History of nephrolithiasis    AKI (acute kidney injury) ([place]) 08/10/2015   Chronic osteomyelitis of pelvic region, left ([place]) 11/06/2014   Paraplegia ([place]) 11/01/2014   Hypothyroidism 10/31/2014   Thoracic stenosis 08/23/2014   Breast cancer of lower-inner quadrant of right female breast ([place]) 12/22/2012   HTN (hypertension) 04/15/2012   Left knee DJD 04/13/2012    Class: Chronic    Past Surgical History:  Procedure  Laterality Date   ABDOMINAL HYSTERECTOMY  ~ [place] Right 2011   BREAST LUMPECTOMY Right 2011   CALDWELL LUC  ~ 2003   "benign tumor removed from up under gum" (04/13/2012)   DACROCYSTORHINOSTOMY  ~ 2000   "put stents in my tear ducts; both eyes" (04/13/2012)   EYE SURGERY  2004   STENTS TO BIL EYES   I & D EXTREMITY Bilateral 11/03/2014   Procedure: IRRIGATION AND DEBRIDEMENT BILATERAL HEEL;  Surgeon: Irene Limbo, MD;  Location: [place];  Service: Plastics;  Laterality: Bilateral;   INCISION AND DRAINAGE OF WOUND Left 11/03/2014   Procedure: IRRIGATION AND DEBRIDEMENT SACRAL ULCER;  Surgeon: Irene Limbo, MD;  Location: [place];  Service: Plastics;  Laterality: Left;   JOINT REPLACEMENT     POSTERIOR CERVICAL LAMINECTOMY  2006   SKIN SPLIT GRAFT Left 08/05/2016   Procedure: SURGICAL PREP  FOR GRAFTING APPLICATION ACELL TO LEFT ISCHIUM;  Surgeon: Irene Limbo, MD;  Location: [place];  Service: Plastics;  Laterality: Left;   TONSILLECTOMY AND ADENOIDECTOMY  ~ 1954   TOTAL KNEE ARTHROPLASTY WITH REVISION COMPONENTS  04/13/2012   Procedure: TOTAL KNEE ARTHROPLASTY WITH REVISION COMPONENTS;  Surgeon: Hessie Dibble, MD;  Location: [place];  Service: Orthopedics;  Laterality: Left;     OB History   No obstetric history on file.     Family History  Problem Relation Age of Onset   Heart disease Father     Social History   Tobacco Use   Smoking status: Former    Packs/day: 0.50    Years: 50.00    Pack years: 25.00    Types: Cigarettes    Quit date: 11/06/2014    Years since quitting: 6.0   Smokeless tobacco: Never  Substance Use Topics   Alcohol use: No   Drug use: No    Home Medications Prior to Admission medications   Medication Sig Start Date End Date Taking? Authorizing Provider  amoxicillin-clavulanate (AUGMENTIN) 875-125 MG tablet Take 1 tablet by mouth 2 (two) times daily. 11/08/20   Truman Hayward, MD  ascorbic acid (VITAMIN C) 500 MG  tablet Take 1 tablet (500 mg total) by mouth daily. 11/07/14   Francesca Oman, DO  baclofen (LIORESAL) 20 MG tablet Take 20 mg by mouth every 6 (six) hours as needed for muscle spasms.     [provider]  bisacodyl (DULCOLAX) 10 MG suppository Place 1 suppository (10 mg total) rectally daily as needed for severe constipation. 09/23/18   Aline August, MD  [facility] SENNA 8.6 MG tablet TAKE 2 TABLETS BY MOUTH AT BEDTIME AS NEEDED 11/09/18   [provider]  docusate sodium (COLACE) 100 MG capsule Take 100 mg by mouth 2 (two) times daily as needed (FOR CONSTIPATION).     [provider]  DULoxetine (CYMBALTA) 60 MG capsule Take 120 mg by mouth daily.     [provider]  ferrous sulfate 325 (65 FE) MG tablet Take 325 mg by mouth daily after supper.     [provider]  folic acid (FOLVITE) 1 MG tablet Take 1 mg by mouth daily.     [provider]  furosemide (LASIX) 40 MG tablet '80mg'$  in AM and '40mg'$  in PM till 09/26/18, then '80mg'$  in AM from 09/27/18 till 10/03/18, then '40mg'$  in AM from 10/04/18 onwards 09/23/18   Aline August, MD  irbesartan (AVAPRO) 300 MG tablet Take 300 mg by mouth daily. Patient not taking: Reported on 11/08/2020 12/23/18   [provider]  lactulose (CHRONULAC) 10 GM/15ML solution TAKE 30 MILLILITERS BY MOUTH FOR 1 DOSE Patient not taking: Reported on 11/08/2020 09/29/18   [provider]  levothyroxine (SYNTHROID, LEVOTHROID) 125 MCG tablet Take 1 tablet (125 mcg total) by mouth daily before breakfast. Patient taking differently: Take 125 mcg by mouth daily. 11/07/14   Francesca Oman, DO  metFORMIN (GLUCOPHAGE) 1000 MG tablet Take 1,000 mg by mouth 2 (two) times daily. 02/01/19   [provider]  metFORMIN (GLUCOPHAGE) 500 MG tablet Take 250 mg by mouth 2 (two) times daily with a meal.    [provider]  metoprolol (LOPRESSOR) 100 MG tablet Take 100 mg by mouth 2 (two) times daily.  10/28/14   [provider]  mometasone-formoterol (DULERA) 100-5 MCG/ACT AERO Inhale 2 puffs into the lungs 2 (two) times daily. Patient not taking: Reported on 11/08/2020 09/23/18   Aline August, MD  Multiple Vitamin (MULTIVITAMIN WITH MINERALS) TABS tablet Take 1 tablet by mouth daily. 11/07/14   Francesca Oman, DO  NIFEdipine (ADALAT CC) 30 MG 24 hr tablet Take 30 mg by mouth daily. 01/17/19   [provider]  NIFEdipine (PROCARDIA XL/ADALAT-CC) 30 MG 24 hr tablet Take 30 mg by mouth daily after supper.     [provider]  oxyCODONE-acetaminophen (PERCOCET) 10-325 MG tablet Take 1 tablet by mouth every 6 (six) hours as needed for pain.     [provider]  polyethylene glycol (MIRALAX / GLYCOLAX) 17 g packet Take 17 g by mouth 2 (two) times daily. 09/23/18   Aline August, MD  pregabalin (LYRICA) 75 MG capsule Take 75 mg by mouth 2 (two) times daily.  08/23/18   [provider]  senna-docusate (SENOKOT-S) 8.6-50 MG tablet Take 1 tablet by mouth 2 (two) times daily. Patient not taking: Reported on 11/08/2020 09/23/18   Aline August, MD  zinc sulfate 220 MG capsule Take 1 capsule (220 mg total) by mouth daily. 11/07/14   Francesca Oman, DO    Allergies    Ivp dye [iodinated diagnostic agents], Aspirin, and Sulfa antibiotics  Review of Systems   Review of Systems  Gastrointestinal:  Positive for abdominal pain.  All other systems reviewed and are negative.  Physical Exam Updated Vital Signs BP (!) 189/95 (BP Location: Right Arm)   Pulse (!) 106   Temp 97.8 F (36.6 C) (Oral)   Resp 20   Ht '5\' 3"'$  (1.6 m)   SpO2 97%   BMI 31.89 kg/m   Physical Exam Vitals and nursing note reviewed. Exam conducted with a chaperone present.  Constitutional:      General: She is not in acute distress.    Appearance: She is well-developed. She is obese.  HENT:     Head: Normocephalic and  atraumatic.     Mouth/Throat:     Mouth: Mucous membranes are moist.  Eyes:      Conjunctiva/sclera: Conjunctivae normal.     Pupils: Pupils are equal, round, and reactive to light.  Cardiovascular:     Rate and Rhythm: Regular rhythm. Tachycardia present.     Pulses: Normal pulses.     Heart sounds: No murmur heard. Pulmonary:     Effort: Pulmonary effort is normal. No respiratory distress.     Breath sounds: Normal breath sounds. No wheezing or rales.  Abdominal:     General: There is distension.     Palpations: Abdomen is soft.     Tenderness: There is abdominal tenderness in the suprapubic area, left upper quadrant and left lower quadrant. There is no right CVA tenderness, left CVA tenderness, guarding or rebound.     Hernia: A hernia is present. Hernia is present in the ventral area.    Genitourinary:      Comments: Minimal soft brown stool in the rectum without fecal impaction. Musculoskeletal:        General: No tenderness. Normal range of motion.     Cervical back: Normal range of motion and neck supple.  Skin:    General: Skin is warm and dry.     Findings: No erythema or rash.  Neurological:     Mental Status: She is alert and oriented to person, place, and time. Mental status is at baseline.     Comments: Paraplegia of the lower extremities  Psychiatric:        Mood and Affect: Mood normal.        Behavior: Behavior normal.    ED Results / Procedures / Treatments   Labs (all labs ordered are listed, but only abnormal results are displayed) Labs Reviewed  COMPREHENSIVE METABOLIC PANEL - Abnormal; Notable for the following components:      Result Value   Glucose, Bld 102 (*)    BUN 24 (*)    Creatinine, Ser 1.35 (*)    Albumin 3.4 (*)    GFR, Estimated 42 (*)    All other components within normal limits  LIPASE, BLOOD  CBC WITH DIFFERENTIAL/PLATELET  URINALYSIS, ROUTINE W REFLEX MICROSCOPIC    EKG None  Radiology DG Abd Acute W/Chest  Result Date: 11/19/2020 CLINICAL DATA:  Constipation. EXAM: DG ABDOMEN ACUTE WITH 1 VIEW CHEST  COMPARISON:  Chest x-ray dated September 20, 2018. Abdominal x-ray dated September 17, 2018. FINDINGS: There is no evidence of dilated bowel loops or free intraperitoneal air. Large colonic stool burden. Unchanged calculi in the lower pole of both kidneys. Heart size and mediastinal contours are within normal limits. Both lungs are clear. IMPRESSION: 1. Large colonic stool burden. No obstruction. 2. Unchanged bilateral nephrolithiasis. 3. No acute cardiopulmonary disease. Electronically Signed   By: Titus Dubin M.D.   On: 11/19/2020 07:53    Procedures Procedures   Medications Ordered in ED Medications  sodium phosphate (FLEET) 7-19 GM/118ML enema 1 enema (has no administration in time range)    ED Course  I have reviewed the triage vital signs and the nursing notes.  Pertinent labs & imaging results that were available during my care of the patient were reviewed by me and considered in my medical decision making (see chart for details).    MDM Rules/Calculators/A&P                           Elderly patient  presenting today with constipation over the last 3 weeks but worsening abdominal distention and abdominal pain over the last few days.  And does have some minimal soft brown stool in the vault but no fecal impaction at this time.  Plain films do show a large colonic stool burden.  Patient is on chronic pain medication and always has issues but it has been worse even despite taking laxatives at home.  Patient does have significant abdominal pain on exam as well.  Concern for possible partial obstruction or ileus.  Also possibility for diverticulitis.  She is not having infectious symptoms at this time.  She is normal no thermic and denies any recent fever, vomiting, chest pain or cough.  Labs are reassuring with stable creatinine, normal LFTs, lipase and CBC.  We will do a CT to further evaluate.  We will also do an enema.  Patient does currently have a sacral decubitus wound but it does not have  significant drainage or signs of acute cellulitis.  She is on amoxicillin for osteomyelitis at this time.  Final Clinical Impression(s) / ED Diagnoses Final diagnoses:  None    Rx / DC Orders ED Discharge Orders     None        Blanchie Dessert, MD 11/19/20 1507

## 2020-11-19 NOTE — ED Notes (Signed)
Pt removed from bedpan and cleaned up.  Full linen change and gown.  Pt had a very good bowel movement.  Placed in hall bed waiting for transport.  Meal given

## 2020-11-19 NOTE — ED Triage Notes (Signed)
Patient is bedbound , c/o abd. Pain states she hasn't had a bowel movement in about 6 days.

## 2020-11-19 NOTE — ED Provider Notes (Signed)
Patient signed out to me is pending CT imaging.  CT image shows no evidence of obstruction.  Patient subsequently had a large bowel movement here abdominal pain is much improved.  Advising outpatient follow-up with GI within the week.  Advising immediate return for fevers recurrent pain vomiting or any additional concerns.    Luna Fuse, MD 11/19/20 [phone]

## 2020-11-19 NOTE — ED Notes (Signed)
Help get patient in the bed on the monitor patient is resting with call bell in reach and family at bedside changed patient pad gave a cleaned one

## 2020-11-19 NOTE — ED Notes (Signed)
Checked on patient. Pain at a 5 but resting comfortably.

## 2020-11-28 ENCOUNTER — Encounter (HOSPITAL_BASED_OUTPATIENT_CLINIC_OR_DEPARTMENT_OTHER): Payer: Medicare Other | Attending: Internal Medicine | Admitting: Internal Medicine

## 2020-11-28 ENCOUNTER — Other Ambulatory Visit: Payer: Self-pay

## 2020-11-28 DIAGNOSIS — E1169 Type 2 diabetes mellitus with other specified complication: Secondary | ICD-10-CM | POA: Diagnosis not present

## 2020-11-28 DIAGNOSIS — M8668 Other chronic osteomyelitis, other site: Secondary | ICD-10-CM | POA: Insufficient documentation

## 2020-11-28 DIAGNOSIS — G8220 Paraplegia, unspecified: Secondary | ICD-10-CM | POA: Insufficient documentation

## 2020-11-28 DIAGNOSIS — L89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, stage 4: Secondary | ICD-10-CM | POA: Diagnosis not present

## 2020-11-28 DIAGNOSIS — I10 Essential (primary) hypertension: Secondary | ICD-10-CM | POA: Insufficient documentation

## 2020-11-28 DIAGNOSIS — B955 Unspecified streptococcus as the cause of diseases classified elsewhere: Secondary | ICD-10-CM | POA: Insufficient documentation

## 2020-11-28 DIAGNOSIS — Z853 Personal history of malignant neoplasm of breast: Secondary | ICD-10-CM | POA: Insufficient documentation

## 2020-11-28 NOTE — Progress Notes (Signed)
TAJE, TONDREAU ([id number]) Visit Report for 11/28/2020 HPI Details Patient Name: Date of Service: [name], [name] 11/28/2020 10:00 [place] Record Number: [id number] Patient Account Number: [id number] Date of Birth/Sex: Treating RN: December 04, 1950 (70 y.o. [name] Primary Care Provider: [name] Other Clinician: Referring Provider: Treating Provider/Extender: Tommy Rainwater in Treatment: 99 History of Present Illness Location: open ulceration of the left gluteal area, left heel and right ankle for about 5 months. Quality: Patient reports No Pain. Severity: Patient states wound(s) are getting worse. Duration: Patient has had the wound for > 5 months prior to seeking treatment at the wound center Context: The wound occurred when the patient has been paraplegic for about 3 years. Modifying Factors: Wound improving due to current treatment. ssociated Signs and Symptoms: Patient reports having foul odor. A HPI Description: this 70 year old patient who is known to have hypertension, hypothyroidism, breast cancer, chronic pain syndrome, paraplegia was noted to have a left gluteal decubitus ulcer and was brought into the hospital. During the course of her hospitalization she was debrided in the operating room by ankle wound. Bone cultures were taken at that time but were negative but clinically she was treated for osteomyelitis because of the probing down to bone and open exposed bone. Home health has been giving her antibioticss which include vancomycin and Zosyn. The patient was a smoker until about 3 weeks ago and used to smoke about 10 cigarettes a day for a long while. 12/13/2014 - details of her operative note from 11/03/2014 were reviewed -- PROCEDURE: 1. Excisional debridement skin, subcutaneous, muscle left ischium 35 cm2 2. Excisional debridement skin, subcutaneous tissue left heel 27 cm2 3. Excisional debridement right ankle skin, subcutaneous, bone 30  cm2 01/24/2015 -- she has some issues with her wheelchair cushion but other than that is doing very well and has received Podus boots for her feet. 02/14/2015 -- she was using her old offloading boots and this seemed to have caused her a new pressure ulcer on the left posterior heel near the superior part just below the Achilles tendon. 03/07/2015 -- she has a new ulceration just to the left of the midline on her sacral region more on the left buttock and this has been there for Dr. Leland Johns and had all the wounds sharply debrided. The debridement was done for the left ischial wound, the left heel wound and the right about a week. 08/22/2015 -- was recently admitted to hospital between May 5 and 08/13/2015, with sepsis and leukocytosis due to a UTI. she was treated for a sepsis complicating Escherichia coli UTI and kidney stones. She also had metabolic and careful up at the secondary to pyelonephritis. He received broad-spectrum antibiotics initially and then received Macrobid as per urology. She was sent home on nitrofurantoin. during her admission she had a CT scan which showed exposed left ischial tuberosity without evidence of osteolysis. 09/12/2015-- the patient is having some issues with her air mattress and would like to get a opinion from medical modalities. 10/10/2015 -- the issue with her air mattress has not yet been sorted out and the new problem seems to be a lot of odor from the wound VAC. 11/27/2015 -- the patient was admitted to the hospital between July 23 and 10/31/2015. Her problems were sepsis, osteomyelitis of the pelvic bone and acute pyelonephritis. CT of the abdomen and pelvis was consistent with a left-sided pyelonephritis with hydronephrosis and also just showed new sclerosis of the posterior portion of  the left anterior pubic ramus suggestive of periosteal reaction consistent with osteomyelitis. She was treated for the osteomyelitis with infectious disease consult  recommending 6 weeks of IV antibiotics including vancomycin and Rocephin and the antibiotics were to go on until 12/10/2015. He was seen by Dr. Iran Planas plastic surgery and Dr. Linus Salmons of infectious disease. She had a suprapubic catheter placed during the admission. CT scan done on 10/28/2015 showed specifically -- New sclerosis of the posterior portion of the left inferior pubic ramus with aggressive periosteal reaction, consistent with osteomyelitis, with adjacent soft tissue gas compatible with previously described decubitus ulcer. 12/12/2015 -- she was recently seen by Dr. Linus Salmons, who noted good improvement and CRP and ESR compared to before and he has stopped her antibiotic as per plans to finish on September 4. The patient was encouraged to continue with wound care and consider hyperbaric oxygen therapy. Today she tells me that she has consented to undergo hyperbaric oxygen therapy and we can start the paperwork. 01/02/2016 -- her PCP had gained about 72 years but she still persists in having problems during hyperbaric oxygen therapy with some discomfort in the ears. 01/09/16; pressure area with underlying osteomyelitis in the left buttock. Wound bed itself has some slight amount of grayish surface slough however I do not think any debridement was necessary. There is no exposed bone soft tissue appears stable. She is using a wound VAC 01/16/16; back for weekly wound review in conjunction with HBO. She has a deep wound over the left initial tuberosity previously treated with 6 weeks of IV antibiotics for osteomyelitis. Wound bed looks reasonably healthy although the base of this is still precariously close to bone. She has been using a wound VAC. 01/23/2016 -- she has completed her course of antibiotics and this week the only new thing is her right great toe nail was avulsed and she has got an open wound over the nailbed. 01/31/16 she has completed her course of antibiotics. Her right great toenail  avulsed last week and she's been using silver alginate for this as well. 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 Did not sound like there was any optimistic discussion regarding reconstruction and the net result would be to continue with the wound VAC application. I will await the official reports. Addendum: -- she was seen at [facility] surgery [facility] by Dr. Tressa Busman. After a thorough review and from what I understand spending 45 minutes with the patient his assessment has been noted by me in detail and the management options were: 1. Continued pressure offloading and wound care versus operative procedures including wound excision 2. Soft tissue and bone sampling 3. If the wound gets larger wound closure would be done using a variety of plastic surgical techniques including but not limited to skin substitute, possible skin graft, local versus regional flaps, negative pressure dressing application. 4. He discussed with her details of flap surgery and the risks associated 5. He made a comment that since the patient was operated on by Dr. Leland Johns of [facility] plastic surgery unit in [place] the patient may continue to follow-up there for further evaluation for surgical flap closure in the future. 03/19/2016 -- the patient continues to be rather depressed and frustrated  with her lack of rapid progress in healing this wound especially because she thought after hyperbaric oxygen therapy the wound would heal extremely fast. She now understands that was not the implied benefit on wound care which was the recommendation for hyperbaric oxygen therapy. I have had a lengthy discussion with the patient and her husband regarding her  options: 1. Continue with collagen and wound VAC for the primary dressing and offloading and all supportive care. 2. See Dr. Iran Planas for possible placement of Acell or Integra in the OR. 3. get a second opinion from a wound care center and surrounding regions/counties 05/07/2016 -- Note from Dr. Celedonio Miyamoto, who noted that the patient has declined flap surgery. She has discussed application of A cell, and try a few applications to see how the wound progresses. She is also recommended that we could apply products here in the wound center, like Oasis. during her preop workup it was found that her hemoglobin A1c was 11% and she has now been diagnosed as having diabetes mellitus and has been put on appropriate treatment by her PCP 05/28/2016 -- tells me her blood sugars have been doing well and she has an appointment to see her PCP in the next couple of weeks to check her hemoglobin A1c. Other than that she continues to do well. 06/25/2016 -- have not seen her back for the last month but she says her health has been about the same and she has an appointment to check the A1c next week 09/10/16 ---- was seen by Dr. Celedonio Miyamoto -- who applied Acell and saw her back in follow-up. She has recommended silver alginate to the wound every other day and cover with foam. If no significant drainage could transition to collagen every other day. She recommended discontinuing wound VAC. There were no plans to repeat application of Acell. The patient expressed that her husband could do the wound care as going to the Wound Ctr., would cost several $100 for each visit. 10/21/2016 -- her insurance company is getting her new mattress and she is pleased about that. Other than that she has been doing dressings with PolyMem Silver and has been doing very well 02/18/2017 -- she has gone through several changes of her mattress and has not been pleased with any of them. The ventricles are still working on trying to  fit her with the appropriate low air-loss mattress. She has a new wound on the gluteal area which is clearly separated from the original wound. 03/25/17-she is here in follow-up evaluation for her left ischialpressure ulcer. She remains unsatisfied with her pressure mattress. She admits to sitting multiple hours a day, in the bed. We have discussed offloading options. The wound does not appear infected. Nutrition does not appear to be a concern. Will follow-up in 4 weeks, if wound continues to be stalled may consider x-ray to evaluate for refractory osteomyelitis. 04/21/17; this is a patient that I don't know all that well. She has a chronic wound which at one point had underlying osteomyelitis in the left ischial tuberosity. This is a stage IV pressure ulcer. Over the last 3 months she has a stage II wound inferiorly to the original wound. The last time she was here her dressing was changed to silver collagen although the patient's husband who changes the dressing said that the collagen stuck to the wound and remove skin from the superficial area therefore he switched back to [place] 05/13/17; this is a patient we've been following for a left  ischial tuberosity wound which was stage IV at one point had underlying osteomyelitis. Over the last several months she's had a stage II wound just inferior and medial to the related to the wound. According to her husband he is using Endoform layer with collagen although this is not what I had last time. According to her husband they are using Elgie Congo with collagen although I don't quite know how that started. She was hospitalized from 1/20 through 04/30/16. This was related to a UTI. Her blood cultures were negative, urine culture showed multiple species. She did have a CT scan of the abdomen and pelvis which documented chronic osteomyelitis in the area of the wound inflammatory markers were unremarkable. She has had prior knowledge of osteomyelitis. It  looks as though she received IV antibiotics in 2017 and was treated with a course of hyperbaric oxygen. 05/28/17; the wound over the left ischial tuberosity is deeper today and abuts clearly on bone. Nursing intake reported drainage. I therefore culture of the wound. The more superficial area just below this looks about the same. They once again complained that there are mattress cover is not working although apparently [facility] is been noted to see this many times in the report is that the device is functional 06/18/17; the patient had a probing area on the left ischial tuberosity that was draining purulent fluid last time. This also clearly seemed to have open bone. Culture I did showed pansensitive pseudomonas including third generation cephalosporins. I treated this with cefdinir 300 twice a day for 10 days and things seem to have improved. She has a more superficial wound just underneath this area. Amazingly she has a new air mattress through [facility]. I think they gave this to her as a parking give. In any case this now works according to the patient may have something to do with why the areas are looking better. 07/09/17; the patient has a probing area in the left ischial tuberosity that still has some depth. However this is contracted in terms of the wound orifice although the depth is still roughly the same. There is no undermining. She also has the satellite wound which is more superficial. This appears to have a healthy surface we've been using silver collagen 08/06/17; the patient's wound is over the left ischial tuberosity and a satellite lesion just underneath this. The original wound was actually a deep stage 4 wound. We have made good progress in 2 months and there is no longer exposed bone here. 09/03/17; left ischial tuberosity actually appears to be quite healthy. I think we are making progress. No debridement is required. There is no surrounding erythema 10/01/17 I  follow this patient monthly for her left ischial tuberosity wound. There is 2 areas the original area and a satellite area. The satellite area looks a lot better there is no surrounding erythema. Her husband relates that he is having trouble maintaining the dressing. This has to do with the soft tissue around it. He states he puts the collagen in but he cannot make sure that it stays in even with the ABD pads and tape that he is been using 10/29/17; patient arrives with a better looking noon today. Some of the satellite lesions have closed. using Prisma 11/26/17; the patient has a large cone-shaped area with the tip of the Cone deep within her buttock soft tissue. The walls of the Cone are epithelialized however the base is still open. The area at the base of this looks  moist we've been using silver collagen. Will change to silver alginate 12/31/2017; the wound appears to have come in fairly nicely. Using silver alginate. There is no surrounding maceration or infection 01/28/18; there is still an open area here over the left initial tuberosity. Base of this however looks healthy. There is no surrounding infection 02/25/18; the area of its open is over the left ischial tuberosity. The base of this is where the wound is. This is a large inverted cone-shaped area with the wound at the tip. Dimensions of the wound at the tip are improved. There is a area of denuded skin about halfway towards the tip which her husband thinks may have happened today when he was bathing her. 04/20/17; the area is still open over the left initial tuberosity. 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 This is now completely closed over although it is closed over with a divot and skin over bone at the base of this. Continued aggressive  offloading will be necessary. 12/30/2018 READMISSION This is a 70 year old woman with chronic paraplegia. I picked her up for her care from Dr. Con Memos in this clinic after he departed. She had a stage IV pressure wound over the left ischial tuberosity. She was treated twice for her underlying osteomyelitis and this I believe firstly in 2016 and again in 2017. There were some plans at some point for flap closure of this however she was discovered to have uncontrolled diabetes and I do not think this was ever accomplished. She ultimately healed over in this clinic and was discharged in May. She has a large cone-shaped indentation with the tip of this going towards the left ischial tuberosity. It is not an easy area to examine but at that time I thought all of this was epithelialized. Apparently there was a reopening here shortly after she left the clinic last time. She was admitted to hospital at the end of June for Klebsiella bacteremia felt to be secondary to UTI. A CT scan of the pelvis is listed below and there was initially some concern that she had underlying osteomyelitis although I believe she was seen by infectious disease and that was felt to be not the case: I do not see any new cultures or inflammatory markers IMPRESSION: 1. No CT evidence for acute intra-abdominal or pelvic abnormality. Large volume of stool throughout the colon. 2. Enlarged fatty liver with fat sparing near the gallbladder fossa 3. Cortical scarring right kidney. Bilateral intrarenal stones without hydronephrosis. Thick-walled urinary bladder decompressed by suprapubic catheter 4. Deep left decubitus ulcer with underlying left ischial changes suggesting osteomyelitis. Her husband has been using silver collagen in the wound. She has not been systemically unwell no fever chills eating and drinking well. They rigorously offload this wound only getting up in the wheelchair when she is going to appointments the rest of the  time she is in bed. 10/8; wound measures larger and she now has exposed bone. We have been using silver alginate 11/12 still using silver alginate. The patient saw Dr. Megan Salon of infectious disease. She was started on Augmentin 500 mg twice daily. She is due to follow-up with Dr. Megan Salon I believe next week. Lab work Dr. Megan Salon requested showed a sedimentation rate of 28 and CRP of 20 although her CRP 1 year ago  was 18.8. Sedimentation rate 1 year ago was 11 basic metabolic panel showed a creatinine of 1.12 12/3; the patient followed up with Dr. Megan Salon yesterday. She is still on Augmentin twice daily. This was directed by Dr. Megan Salon. The patient's inflammatory markers have improved which is gratifying. Her C-reactive protein was repeated yesterday and follow-up booked with infectious disease in January. In addition I have been getting secure text messages I think from palliative care through [facility] [employer]. I think they were hoping to provide services to the patient in her home. They could not get a hold of the primary physician and so they reached out to me on 2 separate occasions. 12/17; patient last saw Dr. Megan Salon on 12/2. She is finishing up with Augmentin. Her C-reactive protein was 20 on 10/21, 10.1 on 11/19 and 17 on 12/2. The wound itself still has depth and undermining. We are using Santyl with the backing wet-to-dry 04/27/2019. The wound is gradually clearing up in terms of the surface although it is not filled in that much. Still abuts right against bone 2/4; patient with a deep pressure ulcer over the left ischial tuberosity. I thought she was going to follow-up with infectious disease to follow her inflammatory markers although the patient states that they stated that they did not need to see her unless we felt it was necessary. I will need to check their notes. In any case we ordered moistened silver collagen back with wet-to-dry to fill in the depth of  the wound although apparently [facility] sent silver alginate which they have been using since they were here the last time. Is obviously not what we ordered. 2/25. Not much change in this wound it is over the left ischial tuberosity recurrent wound. We have been using silver collagen with backing wet-to-dry. I think the wound is about the same. There is still some tunneling from about 10-12 o'clock over the ischial tuberosity itself 3/11; pressure ulcer over the left ischial tuberosity. Since she was last here the wound VAC was started and apparently going quite well. We are able to get the home health company that accepts [place] healthcare which is in itself sometimes problematic. There is been improvements in the wound the tunneling seems to be better and is contracted nicely 4/8; 1 month follow-up. Since she was last here we have been using silver collagen under a wound VAC. Some minor contraction I think in wound volume. She is cared for diligently by her husband including pressure relief, incontinence management, nutritional support etc. 6/1; this is almost a 26-monthfollow-up. She is been using silver collagen under wound VAC. Circular area over the left ischial tuberosity. She has been using silver collagen under wound VAC 7/8; 1 month follow-up. Silver collagen under the VAC not really a lot of progress. Tissue at the base of the wound which is right against bone and the tissue next that this does not look completely viable. She is not currently on any antibiotics, she had underlying osteomyelitis I need to look this over 8/16; we are using silver collagen under wound VAC to the left ischial tuberosity wound. Comes in today with absolutely no change in surface area or depth. There is no exposed bone. I did look over her infectious disease notes as I said I would do last time. She last saw Dr. CMegan Salonin December 2020. She completed 6 weeks of Augmentin. This was in response to a bone culture I  did showing methicillin susceptible staph aureus  and Enterococcus. She was supposed to come back to see Dr. Megan Salon at some point although they say that that appointment was canceled unless I chose to recommend return. I think there was supposed to be follow-up with inflammatory markers but I cannot see that that was ever done. She has not been on antibiotics since 9/21; monthly follow-up. We received a call from home health nurse last evening to report green drainage coming out of the wound. Lab work I ordered last time showed a white count of 5.2 a sedimentation rate of 45 and a C-reactive protein of 25 however neither one of the 2 values are substantially different from her previous values in October 2020 or December 2020. Both are slightly higher but only marginally. Otherwise no new complaints from the patient or her husband 10/19; 1 month follow-up. PCR culture I did last time showed medium quantities of Pseudomonas lower quantities Klebsiella and Enterococcus faecalis group B strep and Peptostreptococcus. I gave her Augmentin for 2 weeks. I am not really sure of my choice of this I would not cover Pseudomonas. She is still having green drainage. Wound itself looks satisfactory there is not a lot of depth wound bed looks healthy 11/16; patient has completed the antibiotics still using gentamicin and silver alginate on the wound. There is improvement in the surface area 12/21; in general the area on the buttock looks somewhat better. Surface looks healthy although I do not know that there is been much improvement in the wound volume. We have been using silver alginate and Hydrofera Blue. Less drainage. In passing the husband showed me an abrasion injury on the left anterior tibia. Covered in necrotic surface. He has noticed this for about a week and has been putting silver collagen on it. He is completely uncertain about how this happened 1/25; monthly follow-up. The area on the left buttock is  about the same. This does not go to bone but a fairly deep wound surface of the wound is of questionable viability. The abrasion injury that they showed me last time apparently was closed out by home health because they thought it was healed but certainly is not although it is just about healed. As a result they haven't been applying anything to this area Finally I did discuss with the patient and her husband the idea of an advanced treatment product to try and get a proper base to this wound I was thinking of Puraply however actually the patient points out that her co-pay for coming to visit [place] i.e. the facility, charge would be unaffordable if they have to, on a weekly basis 2/22; pressure ulcer on the left buttock appears deeper to me and abuts on the ischial tuberosity. I thought initially there was exposed bone but there is a rim of tissue over this area. She also has a superficial over the right anterior mid tibia. Been using silver collagen to these areas without much success. I have looked over the patient's past history with regards to the area on the left ischium. She did have underlying osteomyelitis here dating back I think to late 2020. She saw Dr. Megan Salon she received a 6-week course of oral antibiotics in response to a bone culture that I did. This does not appear to be infected but it certainly has not been improving in terms of granulation. I do not believe she has had any recent imaging studies 3/29; 1 month follow-up. Pressure ulcer on the left buttock which she has been dealing with with  for a number of years. She was treated for underlying osteomyelitis at 1.2 or 3 years ago I think with infectious disease help. She also had I think a flap closure by Dr. Leland Johns and that lasted for about a year and then reopened. I have not been able to get this patient to progress towards healing although truthfully the wound is absolutely no worse. We have been using Hydrofera Blue 4/26;  patient presents for 1 week follow-up. She has been using silver collagen to the area every other day. She has home health that comes out once a week to help with dressing changes as well. The patient is interested in trying a skin substitute over this area. She states she is trying to relieve pressure off of it most of the day. 5/24; patient presents for 1 month follow-up. She has been using silver collagen to the area every other day. She has no complaints today. She is interested in the skin substitute. She tries to leave relieve pressure off Her bottom however is not able to most of the day 6/14; using silver collagen to the area over the left ischial tuberosity. Wound does not appear to be doing particularly well. Open to bone 6/28; the patient patient presented last time with a marked deterioration. Depth probing all the way to bone. The bone itself did not look particularly viable. In spite of this the x-ray I did showed longstanding ulcer over the left ischial tuberosity with chronic bone involvement/reaction that was also seen by CT in 2020. Lab work did not show just active infection with a sed rate of 14 and a C-reactive protein of 7.1. Her comprehensive metabolic panel was normal including an albumin of 4.1 white count was 6 7/19; patient was here 3 weeks ago with a marked deterioration in her wound over the left ischial tuberosity. I ordered a CT scan of the area for 1 reason or another this just did not get done. It is now booked for 8 days from now. She was supposed to come back for bone culture and pathology. That did not happen either. We have been using silver collagen. As usual she is diligently looked after by her husband 8/24; 5-week follow-up. Since the patient was last here the biopsy that I did of her ischial tuberosity came back suggesting osteomyelitis. Culture showed strep. I started her on Augmentin. She was seen by infectious disease Dr. Lucianne Lei dam and the Augmentin was  continued and she is still taking it. CT scan did not suggest anything other than chronic osteomyelitis without much change from her previous study. Her C-reactive protein was only 7 and sedimentation rate was 1.1. We are still using silver collagen to the wound. She has home health. Her husband is very diligent in her care for this reason I have never pursued a diverting colostomy. She would not agree to this surgery in any case. Finally we have discussed plastic surgery with him in the past and she is not interested in a myocutaneous flap. 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 Since she was last here she was in the ER last week with abdominal pain. She was found to be impacted however in the course of the review there somebody gave her some IV morphine and apparently she developed hives and blisters. There is still tense blisters on her plantar left fifth and fourth fingers Electronic Signature(s) Signed: 11/28/2020 4:01:36 PM By: Linton Ham MD Entered By: Linton Ham on 11/28/2020 11:10:29 -------------------------------------------------------------------------------- Physical Exam  Details Patient Name: Date of Service: MAYERLI, KIRST 11/28/2020 10:00 A M Medical Record Number: [id number] Patient Account Number: [id number] Date of Birth/Sex: Treating RN: [birth year] (70 y.o. [name] Primary Care Provider: [name] Other Clinician: Referring Provider: Treating Provider/Extender: Tommy Rainwater in Treatment: 99 Constitutional Sitting or standing Blood Pressure is within target range for patient.. Pulse regular and within target range for patient.Marland Kitchen Respirations regular, non-labored and within target range.. Temperature is normal and within the target range for the patient.Marland Kitchen Appears in no distress. Gastrointestinal (GI) Abdomen is mildly distended but otherwise soft. Notes Wound exam; surface of the wound is mostly granulated now quite a bit  better than the last time I saw this. There is no obvious exposed bone. No evidence of surrounding soft tissue infection Electronic Signature(s) Signed: 11/28/2020 4:01:36 PM By: Linton Ham MD Entered By: Linton Ham on 11/28/2020 11:09:05 -------------------------------------------------------------------------------- Physician Orders Details Patient Name: Date of Service: [name] 11/28/2020 10:00 A M Medical Record Number: [id number] Patient Account Number: [id number] Date of Birth/Sex: Treating RN: 04-20-1950 (70 y.o. [name], [name] Primary Care Provider: [name] Other Clinician: Referring Provider: Treating Provider/Extender: Tommy Rainwater in Treatment: 17 Verbal / Phone Orders: No Diagnosis Coding Follow-up Appointments Return appointment in 1 month. - Dr. Dellia Nims!!:):) Bathing/ Shower/ Hygiene May shower and wash wound with soap and water. - with dressingchanges Edema Control - Lymphedema / SCD / Other Elevate legs to the level of the heart or above for 30 minutes daily and/or when sitting, a frequency of: Avoid standing for long periods of time. Off-Loading Turn and reposition every 2 hours Non Wound Condition Protect area with: - Cover left3rd and 4th fingers with Calcium Alginate with [place] wound care orders this week; continue Home Health for wound care. May utilize formulary equivalent dressing for wound treatment orders unless otherwise specified. Other Home Health Orders/Instructions: - [facility] Wound #12 - Ischial Tuberosity Wound Laterality: Left Cleanser: Soap and Water (Home Health) 2 x Per Day/30 Days Discharge Instructions: May shower and wash wound with dial antibacterial soap and water prior to dressing change. Cleanser: Wound Cleanser (Home Health) 2 x Per Day/30 Days Discharge Instructions: Cleanse the wound with wound cleanser prior to applying a clean dressing using gauze  sponges, not tissue or cotton balls. Peri-Wound Care: Skin Prep (Home Health) 2 x Per Day/30 Days Discharge Instructions: Use skin prep as directed Prim Dressing: Promogran Prisma Matrix, 4.34 (sq in) (silver collagen) (Home Health) 2 x Per Day/30 Days ary Discharge Instructions: Moisten collagen with saline or hydrogel. Apply moistened gauze on top of prisma. Secondary Dressing: ComfortFoam Border, 4x4 in (silicone border) (Home Health) 2 x Per Day/30 Days Discharge Instructions: Apply over primary dressing as directed. Electronic Signature(s) Signed: 11/28/2020 4:01:36 PM By: Linton Ham MD Signed: 11/28/2020 5:14:18 PM By: Rhae Hammock RN Entered By: Rhae Hammock on 11/28/2020 10:31:22 -------------------------------------------------------------------------------- Problem List Details Patient Name: Date of Service: [name], [name]. 11/28/2020 10:00 A M Medical Record Number: [id number] Patient Account Number: [id number] Date of Birth/Sex: Treating RN: November 12, 1950 (70 y.o. [name] Primary Care Provider: [name] Other Clinician: Referring Provider: Treating Provider/Extender: Tommy Rainwater in Treatment: 99 Active Problems ICD-10 Encounter Code Description Active Date MDM Diagnosis 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, stage 4 12/30/2018 No Yes E11.622 Type 2 diabetes mellitus with other skin ulcer 12/30/2018 No Yes M86.68 Other chronic osteomyelitis, other  site 11/28/2020 No Yes Inactive Problems ICD-10 Code Description Active Date Inactive Date G82.20 Paraplegia, unspecified 12/30/2018 12/30/2018 M86.68 Other chronic osteomyelitis, other site 02/17/2019 02/17/2019 S80.811D Abrasion, right lower leg, subsequent encounter 03/27/2020 03/27/2020 L97.811 Non-pressure chronic ulcer of other part of right lower leg limited to breakdown of skin 03/27/2020 03/27/2020 Resolved Problems Electronic Signature(s) Signed: 11/28/2020 4:01:36 PM By:  Linton Ham MD Entered By: Linton Ham on 11/28/2020 10:59:35 -------------------------------------------------------------------------------- Progress Note Details Patient Name: Date of Service: [name] 11/28/2020 10:00 [place] Record Number: [id number] Patient Account Number: [id number] Date of Birth/Sex: Treating RN: October 07, 1950 (70 y.o. [name] Primary Care Provider: [name] Other Clinician: Referring Provider: Treating Provider/Extender: Tommy Rainwater in Treatment: 99 Subjective History of Present Illness (HPI) The following HPI elements were documented for the patient's wound: Location: open ulceration of the left gluteal area, left heel and right ankle for about 5 months. Quality: Patient reports No Pain. Severity: Patient states wound(s) are getting worse. Duration: Patient has had the wound for > 5 months prior to seeking treatment at the wound center Context: The wound occurred when the patient has been paraplegic for about 3 years. Modifying Factors: Wound improving due to current treatment. Associated Signs and Symptoms: Patient reports having foul odor. this 70 year old patient who is known to have hypertension, hypothyroidism, breast cancer, chronic pain syndrome, paraplegia was noted to have a left gluteal decubitus ulcer and was brought into the hospital. During the course of her hospitalization she was debrided in the operating room by ankle wound. Bone cultures were taken at that time but were negative but clinically she was treated for osteomyelitis because of the probing down to bone and open exposed bone. Home health has been giving her antibioticss which include vancomycin and Zosyn. The patient was a smoker until about 3 weeks ago and used to smoke about 10 cigarettes a day for a long while. 12/13/2014 - details of her operative note from 11/03/2014 were reviewed -- PROCEDURE: 1. Excisional debridement skin,  subcutaneous, muscle left ischium 35 cm2 2. Excisional debridement skin, subcutaneous tissue left heel 27 cm2 3. Excisional debridement right ankle skin, subcutaneous, bone 30 cm2 01/24/2015 -- she has some issues with her wheelchair cushion but other than that is doing very well and has received Podus boots for her feet. 02/14/2015 -- she was using her old offloading boots and this seemed to have caused her a new pressure ulcer on the left posterior heel near the superior part just below the Achilles tendon. 03/07/2015 -- she has a new ulceration just to the left of the midline on her sacral region more on the left buttock and this has been there for Dr. Leland Johns and had all the wounds sharply debrided. The debridement was done for the left ischial wound, the left heel wound and the right about a week. 08/22/2015 -- was recently admitted to hospital between May 5 and 08/13/2015, with sepsis and leukocytosis due to a UTI. she was treated for a sepsis complicating Escherichia coli UTI and kidney stones. She also had metabolic and careful up at the secondary to pyelonephritis. He received broad-spectrum antibiotics initially and then received Macrobid as per urology. She was sent home on nitrofurantoin. during her admission she had a CT scan which showed exposed left ischial tuberosity without evidence of osteolysis. 09/12/2015-- the patient is having some issues with her air mattress and would like to get a opinion from medical modalities. 10/10/2015 -- the issue  with her air mattress has not yet been sorted out and the new problem seems to be a lot of odor from the wound VAC. 11/27/2015 -- the patient was admitted to the hospital between July 23 and 10/31/2015. Her problems were sepsis, osteomyelitis of the pelvic bone and acute pyelonephritis. CT of the abdomen and pelvis was consistent with a left-sided pyelonephritis with hydronephrosis and also just showed new sclerosis of the posterior portion of  the left anterior pubic ramus suggestive of periosteal reaction consistent with osteomyelitis. She was treated for the osteomyelitis with infectious disease consult recommending 6 weeks of IV antibiotics including vancomycin and Rocephin and the antibiotics were to go on until 12/10/2015. He was seen by Dr. Iran Planas plastic surgery and Dr. Linus Salmons of infectious disease. She had a suprapubic catheter placed during the admission. CT scan done on 10/28/2015 showed specifically -- New sclerosis of the posterior portion of the left inferior pubic ramus with aggressive periosteal reaction, consistent with osteomyelitis, with adjacent soft tissue gas compatible with previously described decubitus ulcer. 12/12/2015 -- she was recently seen by Dr. Linus Salmons, who noted good improvement and CRP and ESR compared to before and he has stopped her antibiotic as per plans to finish on September 4. The patient was encouraged to continue with wound care and consider hyperbaric oxygen therapy. Today she tells me that she has consented to undergo hyperbaric oxygen therapy and we can start the paperwork. 01/02/2016 -- her PCP had gained about 72 years but she still persists in having problems during hyperbaric oxygen therapy with some discomfort in the ears. 01/09/16; pressure area with underlying osteomyelitis in the left buttock. Wound bed itself has some slight amount of grayish surface slough however I do not think any debridement was necessary. There is no exposed bone soft tissue appears stable. She is using a wound VAC 01/16/16; back for weekly wound review in conjunction with HBO. She has a deep wound over the left initial tuberosity previously treated with 6 weeks of IV antibiotics for osteomyelitis. Wound bed looks reasonably healthy although the base of this is still precariously close to bone. She has been using a wound VAC. 01/23/2016 -- she has completed her course of antibiotics and this week the only new thing is  her right great toe nail was avulsed and she has got an open wound over the nailbed. 01/31/16 she has completed her course of antibiotics. Her right great toenail avulsed last week and she's been using silver alginate for this as well. Still using a wound VAC to the substantial stage IV wound over the left ischial tuberosity 03/05/2016 -- the patient has had a opinion from the plastic surgery group at [facility] and details of this are not available yet but the patient's verbal report has been heard by me. Did not sound like there was any optimistic discussion regarding reconstruction and the net result would be to continue with the wound VAC application. I will await the official reports. Addendum: -- she was seen at [facility] by Dr. Tressa Busman. After a thorough review and from what I understand spending 45 minutes with the patient his assessment has been noted by me in detail and the management options were: 1. Continued pressure offloading and wound care versus operative procedures including wound excision 2. Soft tissue and bone sampling 3. If the wound gets larger wound closure would be done using a variety of plastic surgical techniques including but not limited to skin substitute,  possible skin graft, local versus regional flaps, negative pressure dressing application. 4. He discussed with her details of flap surgery and the risks associated 5. He made a comment that since the patient was operated on by Dr. Leland Johns of [facility] plastic surgery unit in [place] the patient may continue to follow-up there for further evaluation for surgical flap closure in the future. 03/19/2016 -- the patient continues to be rather depressed and frustrated with her lack of rapid progress in healing this wound especially because she thought after hyperbaric oxygen therapy the wound would heal extremely fast. She now understands that was not the implied  benefit on wound care which was the recommendation for hyperbaric oxygen therapy. I have had a lengthy discussion with the patient and her husband regarding her options: 1. Continue with collagen and wound VAC for the primary dressing and offloading and all supportive care. 2. See Dr. Iran Planas for possible placement of Acell or Integra in the OR. 3. get a second opinion from a wound care center and surrounding regions/counties 05/07/2016 -- Note from Dr. Celedonio Miyamoto, who noted that the patient has declined flap surgery. She has discussed application of A cell, and try a few applications to see how the wound progresses. She is also recommended that we could apply products here in the wound center, like Oasis. during her preop workup it was found that her hemoglobin A1c was 11% and she has now been diagnosed as having diabetes mellitus and has been put on appropriate treatment by her PCP 05/28/2016 -- tells me her blood sugars have been doing well and she has an appointment to see her PCP in the next couple of weeks to check her hemoglobin A1c. Other than that she continues to do well. 06/25/2016 -- have not seen her back for the last month but she says her health has been about the same and she has an appointment to check the A1c next week 09/10/16 ---- was seen by Dr. Celedonio Miyamoto -- who applied Acell and saw her back in follow-up. She has recommended silver alginate to the wound every other day and cover with foam. If no significant drainage could transition to collagen every other day. She recommended discontinuing wound VAC. There were no plans to repeat application of Acell. The patient expressed that her husband could do the wound care as going to the Wound Ctr., would cost several $100 for each visit. 10/21/2016 -- her insurance company is getting her new mattress and she is pleased about that. Other than that she has been doing dressings with PolyMem Silver and has been doing very  well 02/18/2017 -- she has gone through several changes of her mattress and has not been pleased with any of them. The ventricles are still working on trying to fit her with the appropriate low air-loss mattress. She has a new wound on the gluteal area which is clearly separated from the original wound. 03/25/17-she is here in follow-up evaluation for her left ischialpressure ulcer. She remains unsatisfied with her pressure mattress. She admits to sitting multiple hours a day, in the bed. We have discussed offloading options. The wound does not appear infected. Nutrition does not appear to be a concern. Will follow-up in 4 weeks, if wound continues to be stalled may consider x-ray to evaluate for refractory osteomyelitis. 04/21/17; this is a patient that I don't know all that well. She has a chronic wound which at one point had underlying osteomyelitis in the left ischial tuberosity. This  is a stage IV pressure ulcer. Over the last 3 months she has a stage II wound inferiorly to the original wound. The last time she was here her dressing was changed to silver collagen although the patient's husband who changes the dressing said that the collagen stuck to the wound and remove skin from the superficial area therefore he switched back to [place] 05/13/17; this is a patient we've been following for a left ischial tuberosity wound which was stage IV at one point had underlying osteomyelitis. Over the last several months she's had a stage II wound just inferior and medial to the related to the wound. According to her husband he is using Endoform layer with collagen although this is not what I had last time. According to her husband they are using Elgie Congo with collagen although I don't quite know how that started. She was hospitalized from 1/20 through 04/30/16. This was related to a UTI. Her blood cultures were negative, urine culture showed multiple species. She did have a CT scan of the abdomen and  pelvis which documented chronic osteomyelitis in the area of the wound inflammatory markers were unremarkable. She has had prior knowledge of osteomyelitis. It looks as though she received IV antibiotics in 2017 and was treated with a course of hyperbaric oxygen. 05/28/17; the wound over the left ischial tuberosity is deeper today and abuts clearly on bone. Nursing intake reported drainage. I therefore culture of the wound. The more superficial area just below this looks about the same. They once again complained that there are mattress cover is not working although apparently [facility] is been noted to see this many times in the report is that the device is functional 06/18/17; the patient had a probing area on the left ischial tuberosity that was draining purulent fluid last time. This also clearly seemed to have open bone. Culture I did showed pansensitive pseudomonas including third generation cephalosporins. I treated this with cefdinir 300 twice a day for 10 days and things seem to have improved. She has a more superficial wound just underneath this area. Amazingly she has a new air mattress through [facility]. I think they gave this to her as a parking give. In any case this now works according to the patient may have something to do with why the areas are looking better. 07/09/17; the patient has a probing area in the left ischial tuberosity that still has some depth. However this is contracted in terms of the wound orifice although the depth is still roughly the same. There is no undermining. ooShe also has the satellite wound which is more superficial. This appears to have a healthy surface we've been using silver collagen 08/06/17; the patient's wound is over the left ischial tuberosity and a satellite lesion just underneath this. The original wound was actually a deep stage 4 wound. We have made good progress in 2 months and there is no longer exposed bone here. 09/03/17; left  ischial tuberosity actually appears to be quite healthy. I think we are making progress. No debridement is required. There is no surrounding erythema 10/01/17 I follow this patient monthly for her left ischial tuberosity wound. There is 2 areas the original area and a satellite area. The satellite area looks a lot better there is no surrounding erythema. Her husband relates that he is having trouble maintaining the dressing. This has to do with the soft tissue around it. He states he puts the collagen in but he cannot make  sure that it stays in even with the ABD pads and tape that he is been using 10/29/17; patient arrives with a better looking noon today. Some of the satellite lesions have closed. using Prisma 11/26/17; the patient has a large cone-shaped area with the tip of the Cone deep within her buttock soft tissue. The walls of the Cone are epithelialized however the base is still open. The area at the base of this looks moist we've been using silver collagen. Will change to silver alginate 12/31/2017; the wound appears to have come in fairly nicely. Using silver alginate. There is no surrounding maceration or infection 01/28/18; there is still an open area here over the left initial tuberosity. Base of this however looks healthy. There is no surrounding infection 02/25/18; the area of its open is over the left ischial tuberosity. The base of this is where the wound is. This is a large inverted cone-shaped area with the wound at the tip. Dimensions of the wound at the tip are improved. There is a area of denuded skin about halfway towards the tip which her husband thinks may have happened today when he was bathing her. 04/20/17; the area is still open over the left initial tuberosity. 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 This is now completely closed over although it is closed over with a divot and skin over bone at the base of this. Continued aggressive offloading will be necessary. 12/30/2018 READMISSION This is a 70 year old woman with chronic paraplegia. I picked her up for her care from Dr. Con Memos in this clinic after he departed. She had a stage IV pressure wound over the left ischial tuberosity. She was treated twice for her underlying osteomyelitis and this I believe firstly in 2016 and again in 2017. There were some plans at some point for flap closure of this however she was discovered to have uncontrolled diabetes and I do not think this was ever accomplished. She ultimately healed over in this clinic and was discharged in May. She has a large cone-shaped indentation with the tip of this going towards the left ischial tuberosity. It is not an easy area to examine but at that time I thought all of this was epithelialized. Apparently there was a reopening here shortly after she left the clinic last time. She was admitted to hospital at the end of June for Klebsiella bacteremia felt to be secondary to UTI. A CT scan of the pelvis is listed below and there was initially some concern that she had underlying osteomyelitis although I believe she was seen by infectious disease and that was felt to be not the case: I do not see any new cultures or inflammatory markers IMPRESSION: 1. No CT evidence for acute intra-abdominal or pelvic abnormality. Large volume of stool throughout the colon. 2. Enlarged fatty liver with fat sparing near the gallbladder fossa 3. Cortical scarring right kidney. Bilateral intrarenal stones without hydronephrosis. Thick-walled urinary bladder decompressed by suprapubic catheter 4. Deep left decubitus ulcer with underlying left ischial changes suggesting osteomyelitis. Her husband has been using silver collagen in the wound. She has not been systemically unwell no  fever chills eating and drinking well. They rigorously offload this wound only getting up in the wheelchair when she is going to appointments the rest of the  time she is in bed. 10/8; wound measures larger and she now has exposed bone. We have been using silver alginate 11/12 still using silver alginate. The patient saw Dr. Megan Salon of infectious disease. She was started on Augmentin 500 mg twice daily. She is due to follow-up with Dr. Megan Salon I believe next week. Lab work Dr. Megan Salon requested showed a sedimentation rate of 28 and CRP of 20 although her CRP 1 year ago was 18.8. Sedimentation rate 1 year ago was 11 basic metabolic panel showed a creatinine of 1.12 12/3; the patient followed up with Dr. Megan Salon yesterday. She is still on Augmentin twice daily. This was directed by Dr. Megan Salon. The patient's inflammatory markers have improved which is gratifying. Her C-reactive protein was repeated yesterday and follow-up booked with infectious disease in January. In addition I have been getting secure text messages I think from palliative care through [facility] [employer]. I think they were hoping to provide services to the patient in her home. They could not get a hold of the primary physician and so they reached out to me on 2 separate occasions. 12/17; patient last saw Dr. Megan Salon on 12/2. She is finishing up with Augmentin. Her C-reactive protein was 20 on 10/21, 10.1 on 11/19 and 17 on 12/2. The wound itself still has depth and undermining. We are using Santyl with the backing wet-to-dry 04/27/2019. The wound is gradually clearing up in terms of the surface although it is not filled in that much. Still abuts right against bone 2/4; patient with a deep pressure ulcer over the left ischial tuberosity. I thought she was going to follow-up with infectious disease to follow her inflammatory markers although the patient states that they stated that they did not need to see her  unless we felt it was necessary. I will need to check their notes. In any case we ordered moistened silver collagen back with wet-to-dry to fill in the depth of the wound although apparently [facility] sent silver alginate which they have been using since they were here the last time. Is obviously not what we ordered. 2/25. Not much change in this wound it is over the left ischial tuberosity recurrent wound. We have been using silver collagen with backing wet-to-dry. I think the wound is about the same. There is still some tunneling from about 10-12 o'clock over the ischial tuberosity itself 3/11; pressure ulcer over the left ischial tuberosity. Since she was last here the wound VAC was started and apparently going quite well. We are able to get the home health company that accepts [place] healthcare which is in itself sometimes problematic. There is been improvements in the wound the tunneling seems to be better and is contracted nicely 4/8; 1 month follow-up. Since she was last here we have been using silver collagen under a wound VAC. Some minor contraction I think in wound volume. She is cared for diligently by her husband including pressure relief, incontinence management, nutritional support etc. 6/1; this is almost a 60-month follow-up. She is been using silver collagen under wound VAC. Circular area over the left ischial tuberosity. She has been using silver collagen under wound VAC 7/8; 1 month follow-up. Silver collagen under the VAC not really a lot of progress. Tissue at the base of the wound which is right against bone and the tissue next that this does not look completely viable. She is not currently on any antibiotics, she had underlying osteomyelitis I need to look this over 8/16; we  are using silver collagen under wound VAC to the left ischial tuberosity wound. Comes in today with absolutely no change in surface area or depth. There is no exposed bone. I did look over her infectious disease  notes as I said I would do last time. She last saw Dr. Megan Salon in December 2020. She completed 6 weeks of Augmentin. This was in response to a bone culture I did showing methicillin susceptible staph aureus and Enterococcus. She was supposed to come back to see Dr. Megan Salon at some point although they say that that appointment was canceled unless I chose to recommend return. I think there was supposed to be follow-up with inflammatory markers but I cannot see that that was ever done. She has not been on antibiotics since 9/21; monthly follow-up. We received a call from home health nurse last evening to report green drainage coming out of the wound. Lab work I ordered last time showed a white count of 5.2 a sedimentation rate of 45 and a C-reactive protein of 25 however neither one of the 2 values are substantially different from her previous values in October 2020 or December 2020. Both are slightly higher but only marginally. Otherwise no new complaints from the patient or her husband 10/19; 1 month follow-up. PCR culture I did last time showed medium quantities of Pseudomonas lower quantities Klebsiella and Enterococcus faecalis group B strep and Peptostreptococcus. I gave her Augmentin for 2 weeks. I am not really sure of my choice of this I would not cover Pseudomonas. She is still having green drainage. Wound itself looks satisfactory there is not a lot of depth wound bed looks healthy 11/16; patient has completed the antibiotics still using gentamicin and silver alginate on the wound. There is improvement in the surface area 12/21; in general the area on the buttock looks somewhat better. Surface looks healthy although I do not know that there is been much improvement in the wound volume. We have been using silver alginate and Hydrofera Blue. Less drainage. In passing the husband showed me an abrasion injury on the left anterior tibia. Covered in necrotic surface. He has noticed this for about a  week and has been putting silver collagen on it. He is completely uncertain about how this happened 1/25; monthly follow-up. The area on the left buttock is about the same. This does not go to bone but a fairly deep wound surface of the wound is of questionable viability. The abrasion injury that they showed me last time apparently was closed out by home health because they thought it was healed but certainly is not although it is just about healed. As a result they haven't been applying anything to this area Finally I did discuss with the patient and her husband the idea of an advanced treatment product to try and get a proper base to this wound I was thinking of Puraply however actually the patient points out that her co-pay for coming to visit [place] i.e. the facility, charge would be unaffordable if they have to, on a weekly basis 2/22; pressure ulcer on the left buttock appears deeper to me and abuts on the ischial tuberosity. I thought initially there was exposed bone but there is a rim of tissue over this area. She also has a superficial over the right anterior mid tibia. Been using silver collagen to these areas without much success. I have looked over the patient's past history with regards to the area on the left ischium. She did have underlying  osteomyelitis here dating back I think to late 2020. She saw Dr. Megan Salon she received a 6-week course of oral antibiotics in response to a bone culture that I did. This does not appear to be infected but it certainly has not been improving in terms of granulation. I do not believe she has had any recent imaging studies 3/29; 1 month follow-up. Pressure ulcer on the left buttock which she has been dealing with with for a number of years. She was treated for underlying osteomyelitis at 1.2 or 3 years ago I think with infectious disease help. She also had I think a flap closure by Dr. Leland Johns and that lasted for about a year and then reopened. I have not  been able to get this patient to progress towards healing although truthfully the wound is absolutely no worse. We have been using Hydrofera Blue 4/26; patient presents for 1 week follow-up. She has been using silver collagen to the area every other day. She has home health that comes out once a week to help with dressing changes as well. The patient is interested in trying a skin substitute over this area. She states she is trying to relieve pressure off of it most of the day. 5/24; patient presents for 1 month follow-up. She has been using silver collagen to the area every other day. She has no complaints today. She is interested in the skin substitute. She tries to leave relieve pressure off Her bottom however is not able to most of the day 6/14; using silver collagen to the area over the left ischial tuberosity. Wound does not appear to be doing particularly well. Open to bone 6/28; the patient patient presented last time with a marked deterioration. Depth probing all the way to bone. The bone itself did not look particularly viable. In spite of this the x-ray I did showed longstanding ulcer over the left ischial tuberosity with chronic bone involvement/reaction that was also seen by CT in 2020. Lab work did not show just active infection with a sed rate of 14 and a C-reactive protein of 7.1. Her comprehensive metabolic panel was normal including an albumin of 4.1 white count was 6 7/19; patient was here 3 weeks ago with a marked deterioration in her wound over the left ischial tuberosity. I ordered a CT scan of the area for 1 reason or another this just did not get done. It is now booked for 8 days from now. She was supposed to come back for bone culture and pathology. That did not happen either. We have been using silver collagen. As usual she is diligently looked after by her husband 8/24; 5-week follow-up. Since the patient was last here the biopsy that I did of her ischial tuberosity came  back suggesting osteomyelitis. Culture showed strep. I started her on Augmentin. She was seen by infectious disease Dr. Lucianne Lei dam and the Augmentin was continued and she is still taking it. CT scan did not suggest anything other than chronic osteomyelitis without much change from her previous study. Her C-reactive protein was only 7 and sedimentation rate was 1.1. We are still using silver collagen to the wound. She has home health. Her husband is very diligent in her care for this reason I have never pursued a diverting colostomy. She would not agree to this surgery in any case. Finally we have discussed plastic surgery with him in the past and she is not interested in a myocutaneous flap. 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 Since she was last here she was in the ER last week with abdominal pain. She was found to be impacted however in the course of the review there somebody gave her some IV morphine and apparently she developed hives and blisters. There is still tense blisters on her plantar left fifth and fourth fingers Objective Constitutional Sitting or standing Blood Pressure is within target range for patient.. Pulse regular and within target range for patient.Marland Kitchen Respirations regular, non-labored and within target range.. Temperature is normal and within the target range for the patient.Marland Kitchen Appears in no distress. Vitals Time Taken: 10:09 AM, Height: 63 in, Weight: 185 lbs, BMI: 32.8, Temperature: 97.7 F, Pulse: 74 bpm, Respiratory Rate: 17 breaths/min, Blood Pressure: 134/74 mmHg. Gastrointestinal (GI) Abdomen is mildly distended but otherwise soft. General Notes: Wound exam; surface of the wound is mostly granulated now quite a bit better than the last time I saw this. There is no obvious exposed bone. No evidence of surrounding soft tissue infection Integumentary (Hair, Skin) Wound #12 status is Open. Original cause of wound was Pressure Injury. The date acquired was: 09/06/2018. The  wound has been in treatment 99 weeks. The wound is located on the Left Ischial Tuberosity. The wound measures 2.5cm length x 1cm width x 1.2cm depth; 1.963cm^2 area and 2.356cm^3 volume. There is Fat Layer (Subcutaneous Tissue) exposed. There is no tunneling noted, however, there is undermining starting at 6:00 and ending at 12:00 with a maximum distance of 0.8cm. There is a medium amount of serosanguineous drainage noted. The wound margin is well defined and not attached to the wound base. There is large (67-100%) red, pink granulation within the wound bed. There is a small (1-33%) amount of necrotic tissue within the wound bed including Adherent Slough. 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 - Dr. Dellia Nims!!:):) Bathing/ Shower/ Hygiene: May shower and wash wound with soap and water. - with dressingchanges Edema Control - Lymphedema / SCD / Other: Elevate legs to the level of the heart or above for 30 minutes daily and/or when sitting, a frequency of: Avoid standing for long periods of time. Off-Loading: Turn and reposition every 2 hours Non Wound Condition: Protect area with: - Cover left3rd and 4th fingers with Calcium Alginate with Silver Home Health: New wound care orders this week; continue Home Health for wound care. May utilize formulary equivalent dressing for wound treatment orders unless otherwise specified. Other Home Health Orders/Instructions: - [facility] WOUND #12: - Ischial Tuberosity Wound Laterality: Left Cleanser: Soap and Water (Home Health) 2 x Per Day/30 Days Discharge Instructions: May shower and wash wound with dial antibacterial soap and water prior to dressing change. Cleanser: Wound Cleanser (Home Health) 2 x Per Day/30 Days Discharge Instructions: Cleanse the wound with wound  cleanser prior to applying a clean dressing using gauze sponges, not tissue or cotton balls. Peri-Wound Care: Skin Prep (Home Health) 2 x Per Day/30 Days Discharge Instructions: Use skin prep as directed Prim Dressing: Promogran Prisma Matrix, 4.34 (sq in) (silver collagen) (Home Health) 2 x Per Day/30 Days ary Discharge Instructions: Moisten collagen with saline or hydrogel. Apply moistened gauze on top of prisma. Secondary Dressing: ComfortFoam Border, 4x4 in (silicone border) (Home Health) 2 x Per Day/30 Days Discharge Instructions: Apply over primary dressing as directed. 1. I have continued with the Prisma 2 it looks as though the  Augmentin has helped here there is certainly no exposed bone 3 she would not agree to any form of surgery unless it was an absolute emergency. Specifically she would not agree to a myocutaneous flap therefore I think pressing a plastic surgery consult is probably not a profitable use of time. Nor would she agree to a diverting colostomy. We have previously discussed this with her. 4. I agree with Dr. Drucilla Schmidt I think oral therapy is probably quite reasonable and she is tolerating the Augmentin well. Fortunately the progression we saw at the end of July seems better 5. Follow-up in 1 month Electronic Signature(s) Signed: 11/28/2020 4:01:36 PM By: Linton Ham MD Entered By: Linton Ham on 11/28/2020 11:13:11 -------------------------------------------------------------------------------- SuperBill Details Patient Name: Date of Service: [name] 11/28/2020 Medical Record Number: [id number] Patient Account Number: [id number] Date of Birth/Sex: Treating RN: 10-Aug-1950 (70 y.o. [name], [name] Primary Care Provider: [name] Other Clinician: Referring Provider: Treating Provider/Extender: Tommy Rainwater in Treatment: 99 Diagnosis Coding ICD-10 Codes Code Description [phone] Pressure ulcer of left buttock, stage 4 E11.622  Type 2 diabetes mellitus with other skin ulcer M86.68 Other chronic osteomyelitis, other site Facility Procedures The patient participates with Medicare or their insurance follows the Medicare Facility Guidelines: CPT4 Code Description Modifier Quantity [id number] [place] VISIT-LEV 3 EST PT 1 Physician Procedures Electronic Signature(s) Signed: 11/28/2020 4:01:36 PM By: Linton Ham MD Entered By: Linton Ham on 11/28/2020 11:13:53

## 2020-11-28 NOTE — Progress Notes (Signed)
MEYA, TROSCLAIR ([id number]) Visit Report for 11/28/2020 Arrival Information Details Patient Name: Date of Service: [name], LAUBY 11/28/2020 10:00 [place] Record Number: [id number] Patient Account Number: [id number] Date of Birth/Sex: Treating RN: 05/27/1950 (70 y.o. [name], [name] Primary Care Trent Gabler: [name] Other Clinician: Referring Sarrinah Gardin: Treating Eren Ryser/Extender: Tommy Rainwater in Treatment: 99 Visit Information History Since Last Visit Added or deleted any medications: No Patient Arrived: Wheel Chair Any new allergies or adverse reactions: No Arrival Time: 09:58 Had a fall or experienced change in No Accompanied By: husband activities of daily living that may affect Transfer Assistance: None risk of falls: Patient Identification Verified: Yes Signs or symptoms of abuse/neglect since last visito No Secondary Verification Process Completed: Yes Hospitalized since last visit: No Patient Requires Transmission-Based Precautions: No Implantable device outside of the clinic excluding No Patient Has Alerts: No cellular tissue based products placed in the center since last visit: Has Dressing in Place as Prescribed: Yes Pain Present Now: Yes Electronic Signature(s) Signed: 11/28/2020 5:14:18 PM By: Rhae Hammock RN Entered By: Rhae Hammock on 11/28/2020 09:58:59 -------------------------------------------------------------------------------- Clinic Level of Care Assessment Details Patient Name: Date of Service: [name], DIONNE 11/28/2020 10:00 A M Medical Record Number: [id number] Patient Account Number: [id number] Date of Birth/Sex: Treating RN: 05/01/1950 (70 y.o. [name], [name] Primary Care Nyeisha Goodall: [name] Other Clinician: Referring Aiven Kampe: Treating Ahaana Rochette/Extender: Tommy Rainwater in Treatment: 99 Clinic Level of Care Assessment Items TOOL 4 Quantity Score X- 1 0 Use when only an  EandM is performed on FOLLOW-UP visit ASSESSMENTS - Nursing Assessment / Reassessment X- 1 10 Reassessment of Co-morbidities (includes updates in patient status) X- 1 5 Reassessment of Adherence to Treatment Plan ASSESSMENTS - Wound and Skin A ssessment / Reassessment X - Simple Wound Assessment / Reassessment - one wound 1 5 '[]'$  - 0 Complex Wound Assessment / Reassessment - multiple wounds X- 1 10 Dermatologic / Skin Assessment (not related to wound area) ASSESSMENTS - Focused Assessment '[]'$  - 0 Circumferential Edema Measurements - multi extremities '[]'$  - 0 Nutritional Assessment / Counseling / Intervention '[]'$  - 0 Lower Extremity Assessment (monofilament, tuning fork, pulses) '[]'$  - 0 Peripheral Arterial Disease Assessment (using hand held doppler) ASSESSMENTS - Ostomy and/or Continence Assessment and Care '[]'$  - 0 Incontinence Assessment and Management '[]'$  - 0 Ostomy Care Assessment and Management (repouching, etc.) PROCESS - Coordination of Care X - Simple Patient / Family Education for ongoing care 1 15 '[]'$  - 0 Complex (extensive) Patient / Family Education for ongoing care X- 1 10 Staff obtains Programmer, systems, Records, T Results / Process Orders est X- 1 10 Staff telephones HHA, Nursing Homes / Clarify orders / etc '[]'$  - 0 Routine Transfer to another Facility (non-emergent condition) '[]'$  - 0 Routine Hospital Admission (non-emergent condition) '[]'$  - 0 New Admissions / Biomedical engineer / Ordering NPWT Apligraf, etc. , '[]'$  - 0 Emergency Hospital Admission (emergent condition) X- 1 10 Simple Discharge Coordination '[]'$  - 0 Complex (extensive) Discharge Coordination PROCESS - Special Needs '[]'$  - 0 Pediatric / Minor Patient Management '[]'$  - 0 Isolation Patient Management '[]'$  - 0 Hearing / Language / Visual special needs '[]'$  - 0 Assessment of Community assistance (transportation, D/C planning, etc.) '[]'$  - 0 Additional assistance / Altered mentation '[]'$  - 0 Support Surface(s)  Assessment (bed, cushion, seat, etc.) INTERVENTIONS - Wound Cleansing / Measurement X - Simple Wound Cleansing - one wound 1 5 '[]'$  - 0 Complex Wound  Cleansing - multiple wounds X- 1 5 Wound Imaging (photographs - any number of wounds) '[]'$  - 0 Wound Tracing (instead of photographs) X- 1 5 Simple Wound Measurement - one wound '[]'$  - 0 Complex Wound Measurement - multiple wounds INTERVENTIONS - Wound Dressings X - Small Wound Dressing one or multiple wounds 1 10 '[]'$  - 0 Medium Wound Dressing one or multiple wounds '[]'$  - 0 Large Wound Dressing one or multiple wounds X- 1 5 Application of Medications - topical '[]'$  - 0 Application of Medications - injection INTERVENTIONS - Miscellaneous '[]'$  - 0 External ear exam '[]'$  - 0 Specimen Collection (cultures, biopsies, blood, body fluids, etc.) '[]'$  - 0 Specimen(s) / Culture(s) sent or taken to Lab for analysis '[]'$  - 0 Patient Transfer (multiple staff / Civil Service fast streamer / Similar devices) '[]'$  - 0 Simple Staple / Suture removal (25 or less) '[]'$  - 0 Complex Staple / Suture removal (26 or more) '[]'$  - 0 Hypo / Hyperglycemic Management (close monitor of Blood Glucose) '[]'$  - 0 Ankle / Brachial Index (ABI) - do not check if billed separately X- 1 5 Vital Signs Has the patient been seen at the hospital within the last three years: Yes Total Score: 110 Level Of Care: New/Established - Level 3 Electronic Signature(s) Signed: 11/28/2020 5:14:18 PM By: Rhae Hammock RN Entered By: Rhae Hammock on 11/28/2020 10:32:08 -------------------------------------------------------------------------------- Encounter Discharge Information Details Patient Name: Date of Service: [name] Pulse M. 11/28/2020 10:00 A M Medical Record Number: [id number] Patient Account Number: [id number] Date of Birth/Sex: Treating RN: 1950/07/19 (70 y.o. [name], [name] Primary Care [name]: [name] Other Clinician: Referring Uriah Philipson: Treating Shylee Durrett/Extender: Tommy Rainwater in Treatment: 99 Encounter Discharge Information Items Discharge Condition: Stable Ambulatory Status: Wheelchair Discharge Destination: Home Transportation: Private Auto Accompanied By: husbandsss Schedule Follow-up Appointment: Yes Clinical Summary of Care: Patient Declined Electronic Signature(s) Signed: 11/28/2020 5:14:18 PM By: Rhae Hammock RN Entered By: Rhae Hammock on 11/28/2020 15:58:04 -------------------------------------------------------------------------------- Lower Extremity Assessment Details Patient Name: Date of Service: [name] 11/28/2020 10:00 A M Medical Record Number: [id number] Patient Account Number: [id number] Date of Birth/Sex: Treating RN: 16-Oct-1950 (70 y.o. [name], [name] Primary Care Delany Steury: [name] Other Clinician: Referring Aylan Bayona: Treating Dmonte Maher/Extender: Tommy Rainwater in Treatment: [id number] Electronic Signature(s) Signed: 11/28/2020 5:14:18 PM By: Rhae Hammock RN Entered By: Rhae Hammock on 11/28/2020 10:10:38 -------------------------------------------------------------------------------- Multi Wound Chart Details Patient Name: Date of Service: [name], [name]. 11/28/2020 10:00 A M Medical Record Number: [id number] Patient Account Number: [id number] Date of Birth/Sex: Treating RN: 1951-01-02 (70 y.o. Benjamine Sprague, Briant Cedar Primary Care Kariel Skillman: [name] Other Clinician: Referring Dannell Raczkowski: Treating Miriana Gaertner/Extender: Tommy Rainwater in Treatment: 99 Vital Signs Height(in): 63 Pulse(bpm): 42 Weight(lbs): 185 Blood Pressure(mmHg): 134/74 Body Mass Index(BMI): 33 Temperature(F): 97.7 Respiratory Rate(breaths/min): 17 Photos: [12:No Photos Left Ischial Tuberosity] [N/A:N/A N/A] Wound Location: [12:Pressure Injury] [N/A:N/A] Wounding Event: [12:Pressure Ulcer] [N/A:N/A] Primary Etiology: [12:Anemia, Hypertension, Type II] [N/A:N/A] Comorbid  History: [12:Diabetes, Osteoarthritis, Dementia, Paraplegia, Received Radiation 09/06/2018] [N/A:N/A] Date Acquired: [12:99] [N/A:N/A] Weeks of Treatment: [12:Open] [N/A:N/A] Wound Status: [12:2.5x1x1.2] [N/A:N/A] Measurements L x W x D (cm) [12:1.963] [N/A:N/A] A (cm) : rea [12:2.356] [N/A:N/A] Volume (cm) : [12:-731.80%] [N/A:N/A] % Reduction in A rea: [12:-1011.30%] [N/A:N/A] % Reduction in Volume: [12:6] Starting Position 1 (o'clock): [12:12] Ending Position 1 (o'clock): [12:0.8] Maximum Distance 1 (cm): [12:Yes] [N/A:N/A] Undermining: [12:Category/Stage IV] [N/A:N/A] Classification: [12:Medium] [N/A:N/A] Exudate A mount: [12:Serosanguineous] [N/A:N/A] Exudate Type: [12:red,  brown] [N/A:N/A] Exudate Color: [12:Well defined, not attached] [N/A:N/A] Wound Margin: [12:Large (67-100%)] [N/A:N/A] Granulation A mount: [12:Red, Pink] [N/A:N/A] Granulation Quality: [12:Small (1-33%)] [N/A:N/A] Necrotic A mount: [12:Fat Layer (Subcutaneous Tissue): Yes N/A] Exposed Structures: [12:Fascia: No Tendon: No Muscle: No Joint: No Bone: No Small (1-33%)] [N/A:N/A] Treatment Notes Electronic Signature(s) Signed: 11/28/2020 4:01:36 PM By: Linton Ham MD Signed: 11/28/2020 5:20:27 PM By: Levan Hurst RN, BSN Entered By: Linton Ham on 11/28/2020 10:59:47 -------------------------------------------------------------------------------- Multi-Disciplinary Care Plan Details Patient Name: Date of Service: [name], Molli Posey M. 11/28/2020 10:00 A M Medical Record Number: [id number] Patient Account Number: [id number] Date of Birth/Sex: Treating RN: [birth year] (70 y.o. [name], [name] Primary Care Azlaan Isidore: [name] Other Clinician: Referring Deavin Forst: Treating Amberrose Friebel/Extender: Tommy Rainwater in Treatment: 99 Active Inactive Wound/Skin Impairment Nursing Diagnoses: Knowledge deficit related to ulceration/compromised skin integrity Goals: Patient/caregiver will  verbalize understanding of skin care regimen Date Initiated: 12/30/2018 Target Resolution Date: 12/27/2020 Goal Status: Active Interventions: Assess patient/caregiver ability to obtain necessary supplies Assess patient/caregiver ability to perform ulcer/skin care regimen upon admission and as needed Assess ulceration(s) every visit Provide education on ulcer and skin care Treatment Activities: Skin care regimen initiated : 12/30/2018 Topical wound management initiated : 12/30/2018 Notes: Electronic Signature(s) Signed: 11/28/2020 5:14:18 PM By: Rhae Hammock RN Entered By: Rhae Hammock on 11/28/2020 10:28:52 -------------------------------------------------------------------------------- Pain Assessment Details Patient Name: Date of Service: [name] 11/28/2020 10:00 [place] Record Number: [id number] Patient Account Number: [id number] Date of Birth/Sex: Treating RN: May 31, 1950 (70 y.o. [name], [name] Primary Care Vanisha Whiten: [name] Other Clinician: Referring Ardine Iacovelli: Treating Choya Tornow/Extender: Tommy Rainwater in Treatment: 99 Active Problems Location of Pain Severity and Description of Pain Patient Has Paino Yes Site Locations Pain Location: Generalized Pain, Pain in Ulcers With Dressing Change: Yes Duration of the Pain. Constant / Intermittento Intermittent Rate the pain. Current Pain Level: 3 Worst Pain Level: 10 Least Pain Level: 0 Tolerable Pain Level: 3 Character of Pain Describe the Pain: Aching Pain Management and Medication Current Pain Management: Medication: Yes Cold Application: No Rest: Yes Massage: No Activity: No T.E.N.S.: No Heat Application: No Leg drop or elevation: No Is the Current Pain Management Adequate: Adequate How does your wound impact your activities of daily livingo Sleep: No Bathing: No Appetite: No Relationship With Others: No Bladder Continence: No Emotions: No Bowel Continence:  No Work: No Toileting: No Drive: No Dressing: No Hobbies: No Electronic Signature(s) Signed: 11/28/2020 5:14:18 PM By: Rhae Hammock RN Entered By: Rhae Hammock on 11/28/2020 10:10:32 -------------------------------------------------------------------------------- Patient/Caregiver Education Details Patient Name: Date of Service: [name] 8/24/2022andnbsp10:00 A M Medical Record Number: [id number] Patient Account Number: [id number] Date of Birth/Gender: Treating RN: 03/01/1951 (70 y.o. [name], [name] Primary Care Physician: [name] Other Clinician: Referring Physician: Treating Physician/Extender: Tommy Rainwater in Treatment: 67 Education Assessment Education Provided To: Patient Education Topics Provided Wound/Skin Impairment: Methods: Explain/Verbal Responses: State content correctly [employer]) Signed: 11/28/2020 5:14:18 PM By: Rhae Hammock RN Entered By: Rhae Hammock on 11/28/2020 10:29:11 -------------------------------------------------------------------------------- Wound Assessment Details Patient Name: Date of Service: [name] 11/28/2020 10:00 A M Medical Record Number: [id number] Patient Account Number: [id number] Date of Birth/Sex: Treating RN: 19-May-1950 (70 y.o. [name], [name] Primary Care Lou Irigoyen: [name] Other Clinician: Referring Emara Lichter: Treating Lesly Joslyn/Extender: Tommy Rainwater in Treatment: 99 Wound Status Wound Number: 12 Primary Pressure Ulcer Etiology: Wound Location: Left Ischial Tuberosity Wound Open  Wounding Event: Pressure Injury Status: Date Acquired: 09/06/2018 Comorbid Anemia, Hypertension, Type II Diabetes, Osteoarthritis, Weeks Of Treatment: 99 History: Dementia, Paraplegia, Received Radiation Clustered Wound: No Wound Measurements Length: (cm) 2.5 Width: (cm) 1 Depth: (cm) 1.2 Area: (cm) 1.963 Volume: (cm) 2.356 %  Reduction in Area: -731.8% % Reduction in Volume: -1011.3% Epithelialization: Small (1-33%) Tunneling: No Undermining: Yes Starting Position (o'clock): 6 Ending Position (o'clock): 12 Maximum Distance: (cm) 0.8 Wound Description Classification: Category/Stage IV Wound Margin: Well defined, not attached Exudate Amount: Medium Exudate Type: Serosanguineous Exudate Color: red, brown Foul Odor After Cleansing: No Slough/Fibrino Yes Wound Bed Granulation Amount: Large (67-100%) Exposed Structure Granulation Quality: Red, Pink Fascia Exposed: No Necrotic Amount: Small (1-33%) Fat Layer (Subcutaneous Tissue) Exposed: Yes Necrotic Quality: Adherent Slough Tendon Exposed: No Muscle Exposed: No Joint Exposed: No Bone Exposed: No Treatment Notes Wound #12 (Ischial Tuberosity) Wound Laterality: Left Cleanser Soap and Water Discharge Instruction: May shower and wash wound with dial antibacterial soap and water prior to dressing change. Wound Cleanser Discharge Instruction: Cleanse the wound with wound cleanser prior to applying a clean dressing using gauze sponges, not tissue or cotton balls. Peri-Wound Care Skin Prep Discharge Instruction: Use skin prep as directed Topical Primary Dressing Promogran Prisma Matrix, 4.34 (sq in) (silver collagen) Discharge Instruction: Moisten collagen with saline or hydrogel. Apply moistened gauze on top of prisma. Secondary Dressing ComfortFoam Border, 4x4 in (silicone border) Discharge Instruction: Apply over primary dressing as directed. Secured With Compression Wrap Compression Stockings Environmental education officer) Signed: 11/28/2020 5:14:18 PM By: Rhae Hammock RN Entered By: Rhae Hammock on 11/28/2020 10:11:12 -------------------------------------------------------------------------------- Vitals Details Patient Name: Date of Service: [name], [name]. 11/28/2020 10:00 [place] Record Number: [id number] Patient  Account Number: [id number] Date of Birth/Sex: Treating RN: [birth year] (70 y.o. [name], [name] Primary Care Kareli Hossain: Other Clinician: [name] Referring Severn Goddard: Treating Shany Marinez/Extender: Tommy Rainwater in Treatment: 99 Vital Signs Time Taken: 10:09 Temperature (F): 97.7 Height (in): 63 Pulse (bpm): 74 Weight (lbs): 185 Respiratory Rate (breaths/min): 17 Body Mass Index (BMI): 32.8 Blood Pressure (mmHg): 134/74 Reference Range: 80 - 120 mg / dl Electronic Signature(s) Signed: 11/28/2020 5:14:18 PM By: Rhae Hammock RN Entered By: Rhae Hammock on 11/28/2020 10:10:00

## 2020-12-11 ENCOUNTER — Other Ambulatory Visit: Payer: Self-pay

## 2020-12-11 ENCOUNTER — Encounter: Payer: Self-pay | Admitting: Infectious Disease

## 2020-12-11 ENCOUNTER — Ambulatory Visit: Payer: Medicare Other | Admitting: Infectious Disease

## 2020-12-11 VITALS — BP 172/84 | HR 87

## 2020-12-11 DIAGNOSIS — A491 Streptococcal infection, unspecified site: Secondary | ICD-10-CM

## 2020-12-11 DIAGNOSIS — N289 Disorder of kidney and ureter, unspecified: Secondary | ICD-10-CM

## 2020-12-11 DIAGNOSIS — I10 Essential (primary) hypertension: Secondary | ICD-10-CM

## 2020-12-11 DIAGNOSIS — E1165 Type 2 diabetes mellitus with hyperglycemia: Secondary | ICD-10-CM

## 2020-12-11 DIAGNOSIS — M86652 Other chronic osteomyelitis, left thigh: Secondary | ICD-10-CM | POA: Diagnosis not present

## 2020-12-11 DIAGNOSIS — G8220 Paraplegia, unspecified: Secondary | ICD-10-CM

## 2020-12-11 DIAGNOSIS — C50311 Malignant neoplasm of lower-inner quadrant of right female breast: Secondary | ICD-10-CM | POA: Diagnosis not present

## 2020-12-11 HISTORY — DX: Disorder of kidney and ureter, unspecified: N28.9

## 2020-12-11 NOTE — Patient Instructions (Signed)
Complete 2 more weeks of antibiotics and then stop them (augmentin)

## 2020-12-11 NOTE — Progress Notes (Signed)
Complete  Chief complaint follow-up for chronic wound and chronic osteomyelitis Subjective:    Patient [name], female    DOB: 1950-08-26, 70 y.o.   MRN: [id number]  HPI  [name] is an 70 year old black woman with a history of cervical myelopathy status post cervical spine surgery which unfortunately resulted in paraplegia paraplegia.  S She  has had problems with recurrent pressure ulcers in her sacral area over the last 6 years.  She had been hospitalized January 2019 underwent debridement.  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  She also was evaluated by plastic surgery at [facility].  A flap closure of the wound was planned but never performed.  She had comorbid controlled diabetes at that time.  She did stop smoke cigarettes.  Wound eventually closed earlier that year and she had been discharged from the wound care center.  She is then hospitalized again in June 2020 with Klebsiella UTI and bacteremia.  CT scan done on September 25, 2018 showed irregular appearance of the left ischium concerning for osteomyelitis.  Was very similar to findings on CT done in January 2019.  She was seen by Dr. Linus Salmons my partner I did not find that there was clinical evidence for infection I recommended focusing on the gram-negative bacteremia from urinary source.  That fall she then developed a new area on her buttocks that opened up she went back to the wound care center on September 24 underwent debridement.  She returns wound care center on January 13, 2019 Dr. Dellia Nims noted palpable bone of the base of the wound and performed a biopsy grams into the bone did not show organism cultures grew methicillin sensitive Staph capitis and Enterococcus.  Patient was seen by my partner Dr. Megan Salon who after thorough discussion with the patient and husband opted to treat her with 6 weeks of  Augmentin.  According to Dr. Hale Bogus notes the wound had not changed a great deal at that time but had shown some improvement and her inflammatory markers were normal he continued an additional 2 weeks of Augmentin in December with plans for her to see him again in January 2021.  According to the patient and the husband the area that Dr. Christella Noa and been treating did heal up eventually.  However since then a new ulcer opened up in a very similar location.  She has been followed very closely by the wound care center by Dr. Heber Volusia and Dr. Dellia Nims.  The wound was not closing.  CT scan was performed on November 02, 2020 which I personally reviewed and does show a left-sided ulcer with findings concerning for osteomyelitis albeit more of a chronic 1 without changes compared to prior scan and no evidence of new bone destruction or abscess.  There is also some soft tissue thickening along with lower sacrum. More recently Dr. Dellia Nims apparently biopsied the palpable bone and this was taken to pathology lab.  Consistent with osteomyelitis a bone culture was also taken which yielded group B streptococcus.  The patient was placed on Augmentin and referred urgently to our clinic.  Talking the patient she does not have any systemic symptoms of fevers chills malaise nausea dizziness lightheadedness.  The husband and the patient indicated that she has not had increasing drainage or purulent material coming from the wound.  They believe that largely the reason that the biopsies were performed was that the wound simply is  failing to close.  I raised the question about whether diversion colostomy had been considered and if that seen plastic surgery recently.  She is very much against diversion colostomy.  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 Dellia Nims indicate that she has not.  She lives at home and has a bed that is supposed to relieve the pressure but she says does not work  very well.  Her husband is very diligent with her wound care but they both agree that him trying to turn her every 2 hours is not possible.  I last saw her we checked basic labs and her inflammatory markers were completely normal.  We decided to continue her Augmentin to complete a 6-week course.  She is seen Dr. Dellia Nims in follow-up and the wound is continuing to improve with granulation tissue present.  Reviewed labs today and I did notice that her creatinine had been up relative to values we have.  She says her PCP gets blood work on her every 12 weeks so there is likely not a terribly new finding with a creatinine at 1.3 versus a year ago when it was 1.  Her inflammatory markers again were normal.  She continues to feel well.         Past Medical History:  Diagnosis Date   Anemia    Arthritis    "all over" (04/13/2012)   Cancer of right breast ([place]) 2011   Cervical neuropathy    PERIPHERAL NEUROPATHY , HAD CERVICAL FUSION   Diabetes mellitus without complication ([place])    Difficult intubation    Fastrach #4 LMA then # 7 ETT used in 2006 [facility], cervical laminectomy)   GERD (gastroesophageal reflux disease)    Group B streptococcal infection 11/08/2020   History of kidney stones    Hypercholesteremia    Hypertension    Hypothyroidism    Kidney stones    Neurogenic bladder    Osteomyelitis (HCC)    Paraplegia (HCC)    Peripheral neuropathy    PONV (postoperative nausea and vomiting)    Sacral decubitus ulcer 10/2014   Sepsis due to urinary tract infection [facility])     Past Surgical History:  Procedure Laterality Date   ABDOMINAL HYSTERECTOMY  ~ [place] Right 2011   BREAST LUMPECTOMY Right 2011   CALDWELL LUC  ~ 2003   "benign tumor removed from up under gum" (04/13/2012)   DACROCYSTORHINOSTOMY  ~ 2000   "put stents in my tear ducts; both eyes" (04/13/2012)   EYE SURGERY  2004   STENTS TO BIL EYES   I & D EXTREMITY Bilateral 11/03/2014    Procedure: IRRIGATION AND DEBRIDEMENT BILATERAL HEEL;  Surgeon: Irene Limbo, MD;  Location: [place];  Service: Plastics;  Laterality: Bilateral;   INCISION AND DRAINAGE OF WOUND Left 11/03/2014   Procedure: IRRIGATION AND DEBRIDEMENT SACRAL ULCER;  Surgeon: Irene Limbo, MD;  Location: [place];  Service: Plastics;  Laterality: Left;   JOINT REPLACEMENT     POSTERIOR CERVICAL LAMINECTOMY  2006   SKIN SPLIT GRAFT Left 08/05/2016   Procedure: SURGICAL PREP  FOR GRAFTING APPLICATION ACELL TO LEFT ISCHIUM;  Surgeon: Irene Limbo, MD;  Location: [place];  Service: Plastics;  Laterality: Left;   TONSILLECTOMY AND ADENOIDECTOMY  ~ 1954   TOTAL KNEE ARTHROPLASTY WITH REVISION COMPONENTS  04/13/2012   Procedure: TOTAL KNEE ARTHROPLASTY WITH REVISION COMPONENTS;  Surgeon: Hessie Dibble,  MD;  Location: [place];  Service: Orthopedics;  Laterality: Left;    Family History  Problem Relation Age of Onset   Heart disease Father       Social History   Socioeconomic History   Marital status: Married    Spouse name: Not on file   Number of children: Not on file   Years of education: Not on file   Highest education level: Not on file  Occupational History   Not on file  Tobacco Use   Smoking status: Former    Packs/day: 0.50    Years: 50.00    Pack years: 25.00    Types: Cigarettes    Quit date: 11/06/2014    Years since quitting: 6.1   Smokeless tobacco: Never  Substance and Sexual Activity   Alcohol use: No   Drug use: No   Sexual activity: Not Currently  Other Topics Concern   Not on file  Social History Narrative   Not on file   Social Determinants of Health   Financial Resource Strain: Not on file  Food Insecurity: Not on file  Transportation Needs: Not on file  Physical Activity: Not on file  Stress: Not on file  Social Connections: Not on file    Allergies  Allergen Reactions   Ivp Dye [Iodinated Diagnostic Agents] Other (See Comments)    "hot and sweaty and almost  passed out"   Aspirin Hives   Sulfa Antibiotics Hives     Current Outpatient Medications:    amoxicillin-clavulanate (AUGMENTIN) 875-125 MG tablet, Take 1 tablet by mouth 2 (two) times daily., Disp: 42 tablet, Rfl: 1   ascorbic acid (VITAMIN C) 500 MG tablet, Take 1 tablet (500 mg total) by mouth daily., Disp: 30 tablet, Rfl: 12   baclofen (LIORESAL) 20 MG tablet, Take 20 mg by mouth every 6 (six) hours as needed for muscle spasms. , Disp: , Rfl: 0   bisacodyl (DULCOLAX) 10 MG suppository, Place 1 suppository (10 mg total) rectally daily as needed for severe constipation., Disp: 12 suppository, Rfl: 0   [employer] SENNA 8.6 MG tablet, TAKE 2 TABLETS BY MOUTH AT BEDTIME AS NEEDED, Disp: , Rfl:    docusate sodium (COLACE) 100 MG capsule, Take 100 mg by mouth 2 (two) times daily as needed (FOR CONSTIPATION). , Disp: , Rfl:    DULoxetine (CYMBALTA) 60 MG capsule, Take 120 mg by mouth daily. , Disp: , Rfl:    ferrous sulfate 325 (65 FE) MG tablet, Take 325 mg by mouth daily after supper. , Disp: , Rfl:    folic acid (FOLVITE) 1 MG tablet, Take 1 mg by mouth daily. , Disp: , Rfl:    furosemide (LASIX) 40 MG tablet, '80mg'$  in AM and '40mg'$  in PM till 09/26/18, then '80mg'$  in AM from 09/27/18 till 10/03/18, then '40mg'$  in AM from 10/04/18 onwards, Disp: 60 tablet, Rfl: 0   irbesartan (AVAPRO) 300 MG tablet, Take 300 mg by mouth daily. (Patient not taking: Reported on 11/08/2020), Disp: , Rfl:    lactulose (CHRONULAC) 10 GM/15ML solution, TAKE 30 MILLILITERS BY MOUTH FOR 1 DOSE (Patient not taking: Reported on 11/08/2020), Disp: , Rfl:    levothyroxine (SYNTHROID, LEVOTHROID) 125 MCG tablet, Take 1 tablet (125 mcg total) by mouth daily before breakfast. (Patient taking differently: Take 125 mcg by mouth daily.), Disp: 30 tablet, Rfl: 0   metFORMIN (GLUCOPHAGE) 1000 MG tablet, Take 1,000 mg by mouth 2 (two) times daily., Disp: , Rfl:    metFORMIN (GLUCOPHAGE) 500  MG tablet, Take 250 mg by mouth 2 (two) times daily with a meal.,  Disp: , Rfl:    metoprolol (LOPRESSOR) 100 MG tablet, Take 100 mg by mouth 2 (two) times daily. , Disp: , Rfl:    mometasone-formoterol (DULERA) 100-5 MCG/ACT AERO, Inhale 2 puffs into the lungs 2 (two) times daily. (Patient not taking: Reported on 11/08/2020), Disp: 1 g, Rfl: 0   Multiple Vitamin (MULTIVITAMIN WITH MINERALS) TABS tablet, Take 1 tablet by mouth daily., Disp: 30 tablet, Rfl: 12   NIFEdipine (ADALAT CC) 30 MG 24 hr tablet, Take 30 mg by mouth daily., Disp: , Rfl:    NIFEdipine (PROCARDIA XL/ADALAT-CC) 30 MG 24 hr tablet, Take 30 mg by mouth daily after supper. , Disp: , Rfl:    oxyCODONE-acetaminophen (PERCOCET) 10-325 MG tablet, Take 1 tablet by mouth every 6 (six) hours as needed for pain. , Disp: , Rfl:    polyethylene glycol (MIRALAX / GLYCOLAX) 17 g packet, Take 17 g by mouth 2 (two) times daily., Disp: 30 each, Rfl: 0   pregabalin (LYRICA) 75 MG capsule, Take 75 mg by mouth 2 (two) times daily. , Disp: , Rfl:    senna-docusate (SENOKOT-S) 8.6-50 MG tablet, Take 1 tablet by mouth 2 (two) times daily. (Patient not taking: Reported on 11/08/2020), Disp: 30 tablet, Rfl: 0   zinc sulfate 220 MG capsule, Take 1 capsule (220 mg total) by mouth daily., Disp: 30 capsule, Rfl: 12   Review of Systems  Constitutional:  Negative for activity change, appetite change, chills, diaphoresis, fatigue, fever and unexpected weight change.  HENT:  Negative for congestion, rhinorrhea, sinus pressure, sneezing, sore throat and trouble swallowing.   Eyes:  Negative for photophobia and visual disturbance.  Respiratory:  Negative for cough, chest tightness, shortness of breath, wheezing and stridor.   Cardiovascular:  Negative for chest pain, palpitations and leg swelling.  Gastrointestinal:  Negative for abdominal distention, abdominal pain, anal bleeding, blood in stool, constipation, diarrhea, nausea and vomiting.  Genitourinary:  Negative for difficulty urinating, dysuria, flank pain and hematuria.   Musculoskeletal:  Negative for arthralgias, back pain, gait problem, joint swelling and myalgias.  Skin:  Positive for wound. Negative for color change, pallor and rash.  Neurological:  Negative for dizziness, tremors, weakness and light-headedness.  Hematological:  Negative for adenopathy. Does not bruise/bleed easily.  Psychiatric/Behavioral:  Negative for agitation, behavioral problems, confusion, decreased concentration, dysphoric mood and sleep disturbance.    Wound 11/08/2020:       Objective:   Physical Exam Constitutional:      General: She is not in acute distress.    Appearance: Normal appearance. She is well-developed. She is not ill-appearing or diaphoretic.  HENT:     Head: Normocephalic and atraumatic.     Right Ear: Hearing and external ear normal.     Left Ear: Hearing and external ear normal.     Nose: Nose normal. No nasal deformity or rhinorrhea.     Mouth/Throat:     Pharynx: No oropharyngeal exudate.  Eyes:     General: No scleral icterus.       Right eye: No discharge.        Left eye: No discharge.     Extraocular Movements: Extraocular movements intact.     Conjunctiva/sclera: Conjunctivae normal.     Right eye: Right conjunctiva is not injected.     Left eye: Left conjunctiva is not injected.     Pupils: Pupils are equal, round, and reactive to light.  Neck:     Vascular: No JVD.  Cardiovascular:     Rate and Rhythm: Normal rate and regular rhythm.     Heart sounds: S1 normal and S2 normal.  Pulmonary:     Effort: Pulmonary effort is normal. No respiratory distress.     Breath sounds: No wheezing or rales.  Abdominal:     Palpations: Abdomen is soft.     Tenderness: There is no abdominal tenderness.  Musculoskeletal:        General: No tenderness. Normal range of motion.     Right shoulder: Normal.     Left shoulder: Normal.     Cervical back: Normal range of motion and neck supple.     Right hip: Normal.     Left hip: Normal.     Right knee:  Normal.     Left knee: Normal.  Lymphadenopathy:     Head:     Right side of head: No submandibular, preauricular or posterior auricular adenopathy.     Left side of head: No submandibular, preauricular or posterior auricular adenopathy.     Cervical: No cervical adenopathy.     Right cervical: No superficial or deep cervical adenopathy.    Left cervical: No superficial or deep cervical adenopathy.  Skin:    General: Skin is warm and dry.     Coloration: Skin is not jaundiced or pale.     Findings: No abrasion, bruising, ecchymosis, erythema, lesion or rash.     Nails: There is no clubbing.  Neurological:     General: No focal deficit present.     Mental Status: She is alert and oriented to person, place, and time.     Sensory: No sensory deficit.     Coordination: Coordination normal.     Gait: Gait normal.  Psychiatric:        Attention and Perception: She is attentive.        Mood and Affect: Mood normal.        Speech: Speech normal.        Behavior: Behavior normal. Behavior is cooperative.        Thought Content: Thought content normal.        Judgment: Judgment normal.   Wound not examined in clinic today   Ischial wound 11/07/2020       Assessment & Plan:   Ischial osteomyelitis:  We will have her complete Augmentin for an additional 2 weeks.  I will check labs again today including a basic metabolic panel sed rate CRP and CBC with differential  She will follow-up with me in 2 months time.  DM: last A1c was 6 per patient  Renal insufficiency:  Reviewed BMP from last visit at 1.31 and from ER at 1.35 on August 15th  WIll route to PCP  BMP Latest Ref Rng & Units 11/19/2020 11/08/2020 01/26/2019  Glucose 70 - 99 mg/dL 102(H) 92 125(H)  BUN 8 - 23 mg/dL 24(H) 20 20  Creatinine 0.44 - 1.00 mg/dL 1.35(H) 1.31(H) 1.12(H)  BUN/Creat Ratio 6 - 22 (calc) - 15 18  Sodium 135 - 145 mmol/L 141 140 139  Potassium 3.5 - 5.1 mmol/L 4.3 4.4 4.7  Chloride 98 - 111 mmol/L  105 101 99  CO2 22 - 32 mmol/L '28 27 25  '$ Calcium 8.9 - 10.3 mg/dL 9.3 9.6 10.0    Hypertension:  BP poorly controlled today in our clinic. Refer to PCP Vitals:   12/11/20 0943  BP: (!) 172/84  Pulse:  87    

## 2020-12-12 LAB — CBC WITH DIFFERENTIAL/PLATELET
Absolute Monocytes: 477 cells/uL (ref 200–950)
Basophils Absolute: 32 cells/uL (ref 0–200)
Basophils Relative: 0.7 %
Eosinophils Absolute: 122 cells/uL (ref 15–500)
Eosinophils Relative: 2.7 %
HCT: 42 % (ref 35.0–45.0)
Hemoglobin: 13.5 g/dL (ref 11.7–15.5)
Lymphs Abs: 1791 cells/uL (ref 850–3900)
MCH: 30.1 pg (ref 27.0–33.0)
MCHC: 32.1 g/dL (ref 32.0–36.0)
MCV: 93.8 fL (ref 80.0–100.0)
MPV: 10.9 fL (ref 7.5–12.5)
Monocytes Relative: 10.6 %
Neutro Abs: 2079 cells/uL (ref 1500–7800)
Neutrophils Relative %: 46.2 %
Platelets: 297 10*3/uL (ref 140–400)
RBC: 4.48 10*6/uL (ref 3.80–5.10)
RDW: 12.7 % (ref 11.0–15.0)
Total Lymphocyte: 39.8 %
WBC: 4.5 10*3/uL (ref 3.8–10.8)

## 2020-12-12 LAB — BASIC METABOLIC PANEL WITH GFR
BUN/Creatinine Ratio: 18 (calc) (ref 6–22)
BUN: 22 mg/dL (ref 7–25)
CO2: 26 mmol/L (ref 20–32)
Calcium: 9.7 mg/dL (ref 8.6–10.4)
Chloride: 102 mmol/L (ref 98–110)
Creat: 1.19 mg/dL — ABNORMAL HIGH (ref 0.60–1.00)
Glucose, Bld: 95 mg/dL (ref 65–99)
Potassium: 4.9 mmol/L (ref 3.5–5.3)
Sodium: 139 mmol/L (ref 135–146)
eGFR: 49 mL/min/{1.73_m2} — ABNORMAL LOW (ref >=60)

## 2020-12-12 LAB — SEDIMENTATION RATE

## 2020-12-12 LAB — C-REACTIVE PROTEIN: CRP: 11.8 mg/L — ABNORMAL HIGH (ref <8.0)

## 2020-12-21 ENCOUNTER — Encounter (HOSPITAL_COMMUNITY): Payer: Self-pay | Admitting: Radiology

## 2020-12-26 ENCOUNTER — Encounter (HOSPITAL_BASED_OUTPATIENT_CLINIC_OR_DEPARTMENT_OTHER): Payer: Medicare Other | Admitting: Internal Medicine

## 2020-12-26 ENCOUNTER — Emergency Department (HOSPITAL_COMMUNITY): Payer: Medicare Other

## 2020-12-26 ENCOUNTER — Inpatient Hospital Stay (HOSPITAL_COMMUNITY)
Admission: EM | Admit: 2020-12-26 | Discharge: 2020-12-28 | DRG: 947 | Disposition: A | Discharge status: Home / Self Care | Payer: Medicare Other | Attending: Family Medicine | Admitting: Family Medicine

## 2020-12-26 DIAGNOSIS — Z882 Allergy status to sulfonamides status: Secondary | ICD-10-CM | POA: Diagnosis not present

## 2020-12-26 DIAGNOSIS — Z87440 Personal history of urinary (tract) infections: Secondary | ICD-10-CM | POA: Diagnosis not present

## 2020-12-26 DIAGNOSIS — L89159 Pressure ulcer of sacral region, unspecified stage: Secondary | ICD-10-CM | POA: Diagnosis not present

## 2020-12-26 DIAGNOSIS — R410 Disorientation, unspecified: Secondary | ICD-10-CM | POA: Diagnosis not present

## 2020-12-26 DIAGNOSIS — E039 Hypothyroidism, unspecified: Secondary | ICD-10-CM | POA: Diagnosis present

## 2020-12-26 DIAGNOSIS — I110 Hypertensive heart disease with heart failure: Secondary | ICD-10-CM | POA: Diagnosis present

## 2020-12-26 DIAGNOSIS — Z79890 Hormone replacement therapy (postmenopausal): Secondary | ICD-10-CM

## 2020-12-26 DIAGNOSIS — Z20822 Contact with and (suspected) exposure to covid-19: Secondary | ICD-10-CM | POA: Diagnosis present

## 2020-12-26 DIAGNOSIS — R0683 Snoring: Secondary | ICD-10-CM | POA: Diagnosis present

## 2020-12-26 DIAGNOSIS — I5030 Unspecified diastolic (congestive) heart failure: Secondary | ICD-10-CM | POA: Diagnosis present

## 2020-12-26 DIAGNOSIS — M62838 Other muscle spasm: Secondary | ICD-10-CM | POA: Diagnosis present

## 2020-12-26 DIAGNOSIS — R6520 Severe sepsis without septic shock: Secondary | ICD-10-CM

## 2020-12-26 DIAGNOSIS — M4802 Spinal stenosis, cervical region: Secondary | ICD-10-CM | POA: Diagnosis present

## 2020-12-26 DIAGNOSIS — G9340 Encephalopathy, unspecified: Secondary | ICD-10-CM | POA: Diagnosis not present

## 2020-12-26 DIAGNOSIS — Z993 Dependence on wheelchair: Secondary | ICD-10-CM

## 2020-12-26 DIAGNOSIS — K219 Gastro-esophageal reflux disease without esophagitis: Secondary | ICD-10-CM | POA: Diagnosis present

## 2020-12-26 DIAGNOSIS — Z79899 Other long term (current) drug therapy: Secondary | ICD-10-CM

## 2020-12-26 DIAGNOSIS — Y838 Other surgical procedures as the cause of abnormal reaction of the patient, or of later complication, without mention of misadventure at the time of the procedure: Secondary | ICD-10-CM | POA: Diagnosis present

## 2020-12-26 DIAGNOSIS — E1142 Type 2 diabetes mellitus with diabetic polyneuropathy: Secondary | ICD-10-CM | POA: Diagnosis present

## 2020-12-26 DIAGNOSIS — Z853 Personal history of malignant neoplasm of breast: Secondary | ICD-10-CM

## 2020-12-26 DIAGNOSIS — G8220 Paraplegia, unspecified: Secondary | ICD-10-CM | POA: Diagnosis not present

## 2020-12-26 DIAGNOSIS — Z7984 Long term (current) use of oral hypoglycemic drugs: Secondary | ICD-10-CM | POA: Diagnosis not present

## 2020-12-26 DIAGNOSIS — F4320 Adjustment disorder, unspecified: Secondary | ICD-10-CM | POA: Diagnosis present

## 2020-12-26 DIAGNOSIS — Z87891 Personal history of nicotine dependence: Secondary | ICD-10-CM

## 2020-12-26 DIAGNOSIS — E7800 Pure hypercholesterolemia, unspecified: Secondary | ICD-10-CM | POA: Diagnosis present

## 2020-12-26 DIAGNOSIS — I472 Ventricular tachycardia: Secondary | ICD-10-CM | POA: Diagnosis not present

## 2020-12-26 DIAGNOSIS — N319 Neuromuscular dysfunction of bladder, unspecified: Secondary | ICD-10-CM | POA: Diagnosis present

## 2020-12-26 DIAGNOSIS — L89154 Pressure ulcer of sacral region, stage 4: Secondary | ICD-10-CM | POA: Diagnosis present

## 2020-12-26 DIAGNOSIS — R6889 Other general symptoms and signs: Secondary | ICD-10-CM | POA: Diagnosis present

## 2020-12-26 DIAGNOSIS — R4689 Other symptoms and signs involving appearance and behavior: Secondary | ICD-10-CM

## 2020-12-26 DIAGNOSIS — Z8249 Family history of ischemic heart disease and other diseases of the circulatory system: Secondary | ICD-10-CM

## 2020-12-26 DIAGNOSIS — A419 Sepsis, unspecified organism: Secondary | ICD-10-CM | POA: Diagnosis not present

## 2020-12-26 DIAGNOSIS — Z981 Arthrodesis status: Secondary | ICD-10-CM

## 2020-12-26 DIAGNOSIS — Z885 Allergy status to narcotic agent status: Secondary | ICD-10-CM

## 2020-12-26 DIAGNOSIS — Z91041 Radiographic dye allergy status: Secondary | ICD-10-CM

## 2020-12-26 DIAGNOSIS — Z96652 Presence of left artificial knee joint: Secondary | ICD-10-CM | POA: Diagnosis present

## 2020-12-26 DIAGNOSIS — Z9841 Cataract extraction status, right eye: Secondary | ICD-10-CM

## 2020-12-26 DIAGNOSIS — T8859XA Other complications of anesthesia, initial encounter: Secondary | ICD-10-CM | POA: Diagnosis present

## 2020-12-26 DIAGNOSIS — N179 Acute kidney failure, unspecified: Secondary | ICD-10-CM | POA: Diagnosis present

## 2020-12-26 DIAGNOSIS — M1712 Unilateral primary osteoarthritis, left knee: Secondary | ICD-10-CM | POA: Diagnosis present

## 2020-12-26 LAB — CBC WITH DIFFERENTIAL/PLATELET
Abs Immature Granulocytes: 0.03 10*3/uL (ref 0.00–0.07)
Basophils Absolute: 0 10*3/uL (ref 0.0–0.1)
Basophils Relative: 0 %
Eosinophils Absolute: 0.1 10*3/uL (ref 0.0–0.5)
Eosinophils Relative: 2 %
HCT: 41.5 % (ref 36.0–46.0)
Hemoglobin: 12.6 g/dL (ref 12.0–15.0)
Immature Granulocytes: 1 %
Lymphocytes Relative: 26 %
Lymphs Abs: 1.5 10*3/uL (ref 0.7–4.0)
MCH: 30.8 pg (ref 26.0–34.0)
MCHC: 30.4 g/dL (ref 30.0–36.0)
MCV: 101.5 fL — ABNORMAL HIGH (ref 80.0–100.0)
Monocytes Absolute: 0.5 10*3/uL (ref 0.1–1.0)
Monocytes Relative: 9 %
Neutro Abs: 3.6 10*3/uL (ref 1.7–7.7)
Neutrophils Relative %: 62 %
Platelets: 271 10*3/uL (ref 150–400)
RBC: 4.09 MIL/uL (ref 3.87–5.11)
RDW: 13.8 % (ref 11.5–15.5)
WBC: 5.7 10*3/uL (ref 4.0–10.5)
nRBC: 0 % (ref 0.0–0.2)

## 2020-12-26 LAB — URINALYSIS, ROUTINE W REFLEX MICROSCOPIC
Bilirubin Urine: NEGATIVE
Glucose, UA: NEGATIVE mg/dL
Hgb urine dipstick: NEGATIVE
Ketones, ur: NEGATIVE mg/dL
Nitrite: NEGATIVE
Protein, ur: NEGATIVE mg/dL
Specific Gravity, Urine: <1.005 — ABNORMAL LOW (ref 1.005–1.030)
pH: 6 (ref 5.0–8.0)

## 2020-12-26 LAB — COMPREHENSIVE METABOLIC PANEL
ALT: 21 U/L (ref 0–44)
AST: 27 U/L (ref 15–41)
Albumin: 3.6 g/dL (ref 3.5–5.0)
Alkaline Phosphatase: 53 U/L (ref 38–126)
Anion gap: 17 — ABNORMAL HIGH (ref 5–15)
BUN: 28 mg/dL — ABNORMAL HIGH (ref 8–23)
CO2: 23 mmol/L (ref 22–32)
Calcium: 9.1 mg/dL (ref 8.9–10.3)
Chloride: 98 mmol/L (ref 98–111)
Creatinine, Ser: 2.35 mg/dL — ABNORMAL HIGH (ref 0.44–1.00)
GFR, Estimated: 21 mL/min — ABNORMAL LOW (ref >60)
Glucose, Bld: 109 mg/dL — ABNORMAL HIGH (ref 70–99)
Potassium: 3.9 mmol/L (ref 3.5–5.1)
Sodium: 138 mmol/L (ref 135–145)
Total Bilirubin: 0.3 mg/dL (ref 0.3–1.2)
Total Protein: 7.4 g/dL (ref 6.5–8.1)

## 2020-12-26 LAB — CBG MONITORING, ED
Glucose-Capillary: 79 mg/dL (ref 70–99)
Glucose-Capillary: 93 mg/dL (ref 70–99)
Glucose-Capillary: 89 mg/dL (ref 70–99)

## 2020-12-26 LAB — RESP PANEL BY RT-PCR (FLU A&B, COVID) ARPGX2
Influenza A by PCR: NEGATIVE
Influenza B by PCR: NEGATIVE
SARS Coronavirus 2 by RT PCR: NEGATIVE

## 2020-12-26 LAB — RAPID URINE DRUG SCREEN, HOSP PERFORMED
Amphetamines: NOT DETECTED
Barbiturates: NOT DETECTED
Benzodiazepines: NOT DETECTED
Cocaine: NOT DETECTED
Opiates: NOT DETECTED
Tetrahydrocannabinol: NOT DETECTED

## 2020-12-26 LAB — URINALYSIS, MICROSCOPIC (REFLEX)

## 2020-12-26 LAB — LACTIC ACID, PLASMA
Lactic Acid, Venous: 6.6 mmol/L (ref 0.5–1.9)
Lactic Acid, Venous: 2.7 mmol/L (ref 0.5–1.9)
Lactic Acid, Venous: 2.8 mmol/L (ref 0.5–1.9)

## 2020-12-26 LAB — PROCALCITONIN: Procalcitonin: 0.61 ng/mL

## 2020-12-26 MED ORDER — METRONIDAZOLE 500 MG/100ML IV SOLN
500.0000 mg | Freq: Two times a day (BID) | INTRAVENOUS | Status: DC
  Administered 2020-12-26 – 2020-12-28 (×4): 500 mg via INTRAVENOUS
  Filled 2020-12-26 (×4): qty 100

## 2020-12-26 MED ORDER — INSULIN ASPART 100 UNIT/ML IJ SOLN
0.0000 [IU] | Freq: Three times a day (TID) | INTRAMUSCULAR | Status: DC
  Administered 2020-12-28: 1 [IU] via SUBCUTANEOUS

## 2020-12-26 MED ORDER — LACTATED RINGERS IV BOLUS (SEPSIS)
500.0000 mL | Freq: Once | INTRAVENOUS | Status: AC
  Administered 2020-12-26: 500 mL via INTRAVENOUS

## 2020-12-26 MED ORDER — DIPHENHYDRAMINE HCL 50 MG/ML IJ SOLN
12.5000 mg | Freq: Once | INTRAMUSCULAR | Status: AC
  Administered 2020-12-26: 12.5 mg via INTRAVENOUS
  Filled 2020-12-26: qty 1

## 2020-12-26 MED ORDER — ENOXAPARIN SODIUM 30 MG/0.3ML IJ SOSY
30.0000 mg | PREFILLED_SYRINGE | INTRAMUSCULAR | Status: DC
  Administered 2020-12-26: 30 mg via SUBCUTANEOUS
  Filled 2020-12-26: qty 0.3

## 2020-12-26 MED ORDER — PROCHLORPERAZINE EDISYLATE 10 MG/2ML IJ SOLN
5.0000 mg | Freq: Four times a day (QID) | INTRAMUSCULAR | Status: DC | PRN
  Administered 2020-12-26: 5 mg via INTRAVENOUS
  Filled 2020-12-26: qty 1
  Filled 2020-12-26: qty 2

## 2020-12-26 MED ORDER — SODIUM CHLORIDE 0.9 % IV SOLN
2.0000 g | Freq: Once | INTRAVENOUS | Status: AC
  Administered 2020-12-26: 2 g via INTRAVENOUS
  Filled 2020-12-26: qty 2

## 2020-12-26 MED ORDER — LACTATED RINGERS IV BOLUS (SEPSIS)
1000.0000 mL | Freq: Once | INTRAVENOUS | Status: AC
  Administered 2020-12-26: 1000 mL via INTRAVENOUS

## 2020-12-26 MED ORDER — VANCOMYCIN HCL 1500 MG/300ML IV SOLN: 1500.0000 mg | Freq: Every day | INTRAVENOUS | Status: DC

## 2020-12-26 MED ORDER — VANCOMYCIN HCL 1500 MG/300ML IV SOLN
1500.0000 mg | Freq: Once | INTRAVENOUS | Status: AC
  Administered 2020-12-26: 1500 mg via INTRAVENOUS
  Filled 2020-12-26: qty 300

## 2020-12-26 MED ORDER — METOPROLOL TARTRATE 100 MG PO TABS
100.0000 mg | ORAL_TABLET | Freq: Two times a day (BID) | ORAL | Status: DC
  Administered 2020-12-26 – 2020-12-28 (×5): 100 mg via ORAL
  Filled 2020-12-26: qty 1
  Filled 2020-12-26 (×3): qty 4
  Filled 2020-12-26: qty 1

## 2020-12-26 MED ORDER — LEVOTHYROXINE SODIUM 25 MCG PO TABS
125.0000 ug | ORAL_TABLET | Freq: Every day | ORAL | Status: DC
  Administered 2020-12-27 – 2020-12-28 (×2): 125 ug via ORAL
  Filled 2020-12-26 (×2): qty 1

## 2020-12-26 MED ORDER — SODIUM CHLORIDE 0.9 % IV SOLN: 2.0000 g | INTRAVENOUS | Status: DC

## 2020-12-26 MED ORDER — LACTATED RINGERS IV SOLN: INTRAVENOUS | Status: AC

## 2020-12-26 MED ORDER — METRONIDAZOLE 500 MG/100ML IV SOLN
500.0000 mg | Freq: Once | INTRAVENOUS | Status: AC
  Administered 2020-12-26: 500 mg via INTRAVENOUS
  Filled 2020-12-26: qty 100

## 2020-12-26 MED ORDER — VANCOMYCIN VARIABLE DOSE PER UNSTABLE RENAL FUNCTION (PHARMACIST DOSING): Status: DC

## 2020-12-26 MED ORDER — FOLIC ACID 1 MG PO TABS
1.0000 mg | ORAL_TABLET | Freq: Every day | ORAL | Status: DC
  Administered 2020-12-26 – 2020-12-28 (×3): 1 mg via ORAL
  Filled 2020-12-26 (×3): qty 1

## 2020-12-26 NOTE — Sepsis Progress Note (Signed)
eLink monitoring code sepsis.  

## 2020-12-26 NOTE — ED Notes (Signed)
Bear Hugger placed on pt as ordered by Dr. Tyrone Nine.

## 2020-12-26 NOTE — ED Notes (Signed)
Pt's BP was in the 190s. MD Gwyndolyn Saxon at bedside made aware of it. Stated to administer metoprolol.

## 2020-12-26 NOTE — ED Notes (Addendum)
Warm blankets placed on pt. 

## 2020-12-26 NOTE — ED Notes (Addendum)
Unable to get temperature in triage

## 2020-12-26 NOTE — ED Triage Notes (Signed)
BIB [facility]. From home. Family woke pt up and she was acting abnormal. Normally A&Ox4 and can follow commands. However now will not follow commands and is confused and is withdrawing from touch. Cataract surgery yesterday morning and was normal. Just finished antibiotics due to a bone infection. [place] cath in place. Pressure wound on lt leg.

## 2020-12-26 NOTE — ED Provider Notes (Signed)
[facility] EMERGENCY DEPARTMENT Provider Note   CSN: [id number] Arrival date & time: 12/26/20  5374     History Chief Complaint  Patient presents with   Altered Mental Status    [name] is a 70 y.o. female.  [name] is a 70 yo female with PMHx below presented to the ED with husband, husband states that patient woke up and she began randomly counting backwards. She was confused and disoriented. She was unable recall her children name or her own name.  Husband then called EMS.  According to the Husband, patient had cataract surgery of right eye yesterday morning and received low dose Versed during procedure. She felt fine after procedure. Later in the day she became very sleepy and slept through the night. Did not take any of her nighttime medications.  Patient takes Oxycodone Q5H and Baclofen for pain/muscle spasms. Last dose, yesterday morning prior to surgery. No unusual reaction according to the husband. Recently completed 6wk IV course of antibiotic Augmentin for sacral decubitus osteomyelitis, last dose 12/23/20.   Patient did not eat/drink or take any home medications this morning. Husband states nothing unusual occurred that could have triggered the current events. Husband, states this has never happened before in the past. Husband states patient does have a sacral wound. Patient is wheelchair bound since 2006, with suprapubic foley that is changed once a month by [facility] nurse. No recent falls.  Husband states that for the past several days he has noticed that the patient has decreased fluid intake. No fatigue, anorexia, N/V, fever or chills.    The history is provided by the spouse. No language interpreter was used.  Altered Mental Status Presenting symptoms: confusion and disorientation   Severity:  Moderate Most recent episode:  Today Context: recent infection   Context: not head injury   Associated symptoms: headaches   Associated  symptoms: no fever   Headaches:    Severity:  Unable to specify   Chronicity:  New     Past Medical History:  Diagnosis Date   Anemia    Arthritis    "all over" (04/13/2012)   Cancer of right breast ([place]) 2011   Cervical neuropathy    PERIPHERAL NEUROPATHY , HAD CERVICAL FUSION   Diabetes mellitus without complication ([place])    Difficult intubation    Fastrach #4 LMA then # 7 ETT used in 2006 [facility], cervical laminectomy)   GERD (gastroesophageal reflux disease)    Group B streptococcal infection 11/08/2020   History of kidney stones    Hypercholesteremia    Hypertension    Hypothyroidism    Kidney stones    Neurogenic bladder    Osteomyelitis (HCC)    Paraplegia (HCC)    Peripheral neuropathy    PONV (postoperative nausea and vomiting)    Renal insufficiency 12/11/2020   Sacral decubitus ulcer 10/2014   Sepsis due to urinary tract infection [facility])     Patient Active Problem List   Diagnosis Date Noted   Sepsis ([place]) 12/26/2020   Renal insufficiency 12/11/2020   Group B streptococcal infection 11/08/2020   Foley catheter in place 02/03/2019   GERD (gastroesophageal reflux disease) 02/03/2019   Hard to intubate 02/03/2019   Poor vision 02/03/2019   Former smoker 01/26/2019   Neuropathy 01/26/2019   Adjustment disorder with disturbance of emotion 12/02/2018   Palliative care by specialist    Advanced care planning/counseling discussion    Spondylosis, cervical, with myelopathy    Left perineal  ischial pressure ulcer 09/16/2018   Diabetes type 2, uncontrolled ([place]) 04/26/2017   Urinary tract infection associated with cystostomy catheter ([place])    Medication monitoring encounter 12/03/2015   Hypertension    History of nephrolithiasis    AKI (acute kidney injury) ([place]) 08/10/2015   Chronic osteomyelitis of pelvic region, left ([place]) 11/06/2014   Paraplegia ([place]) 11/01/2014   Hypothyroidism 10/31/2014   Thoracic stenosis 08/23/2014   Breast cancer of lower-inner  quadrant of right female breast ([place]) 12/22/2012   HTN (hypertension) 04/15/2012   Left knee DJD 04/13/2012    Class: Chronic    Past Surgical History:  Procedure Laterality Date   ABDOMINAL HYSTERECTOMY  ~ [place] Right 2011   BREAST LUMPECTOMY Right 2011   CALDWELL LUC  ~ 2003   "benign tumor removed from up under gum" (04/13/2012)   DACROCYSTORHINOSTOMY  ~ 2000   "put stents in my tear ducts; both eyes" (04/13/2012)   EYE SURGERY  2004   STENTS TO BIL EYES   I & D EXTREMITY Bilateral 11/03/2014   Procedure: IRRIGATION AND DEBRIDEMENT BILATERAL HEEL;  Surgeon: Irene Limbo, MD;  Location: [place];  Service: Plastics;  Laterality: Bilateral;   INCISION AND DRAINAGE OF WOUND Left 11/03/2014   Procedure: IRRIGATION AND DEBRIDEMENT SACRAL ULCER;  Surgeon: Irene Limbo, MD;  Location: [place];  Service: Plastics;  Laterality: Left;   JOINT REPLACEMENT     POSTERIOR CERVICAL LAMINECTOMY  2006   SKIN SPLIT GRAFT Left 08/05/2016   Procedure: SURGICAL PREP  FOR GRAFTING APPLICATION ACELL TO LEFT ISCHIUM;  Surgeon: Irene Limbo, MD;  Location: [place];  Service: Plastics;  Laterality: Left;   TONSILLECTOMY AND ADENOIDECTOMY  ~ 1954   TOTAL KNEE ARTHROPLASTY WITH REVISION COMPONENTS  04/13/2012   Procedure: TOTAL KNEE ARTHROPLASTY WITH REVISION COMPONENTS;  Surgeon: Hessie Dibble, MD;  Location: [place];  Service: Orthopedics;  Laterality: Left;     OB History   No obstetric history on file.     Family History  Problem Relation Age of Onset   Heart disease Father     Social History   Tobacco Use   Smoking status: Former    Packs/day: 0.50    Years: 50.00    Pack years: 25.00    Types: Cigarettes    Quit date: 11/06/2014    Years since quitting: 6.1   Smokeless tobacco: Never  Substance Use Topics   Alcohol use: No   Drug use: No    Home Medications Prior to Admission medications   Medication Sig Start Date End Date Taking? Authorizing  Provider  amoxicillin-clavulanate (AUGMENTIN) 875-125 MG tablet Take 1 tablet by mouth 2 (two) times daily. 11/08/20   Truman Hayward, MD  ascorbic acid (VITAMIN C) 500 MG tablet Take 1 tablet (500 mg total) by mouth daily. 11/07/14   Francesca Oman, DO  baclofen (LIORESAL) 20 MG tablet Take 20 mg by mouth every 6 (six) hours as needed for muscle spasms.     [provider]  bisacodyl (DULCOLAX) 10 MG suppository Place 1 suppository (10 mg total) rectally daily as needed for severe constipation. 09/23/18   Aline August, MD  [facility] SENNA 8.6 MG tablet TAKE 2 TABLETS BY MOUTH AT BEDTIME AS NEEDED 11/09/18   [provider]  docusate sodium (COLACE) 100 MG capsule Take 100 mg by mouth 2 (two) times daily as needed (FOR CONSTIPATION).     [provider]  DULoxetine (CYMBALTA) 60 MG capsule Take 120 mg by mouth daily.     [provider]  ferrous sulfate 325 (65 FE) MG tablet Take 325 mg by mouth daily after supper.     [provider]  folic acid (FOLVITE) 1 MG tablet Take 1 mg by mouth daily.     [provider]  furosemide (LASIX) 40 MG tablet 80mg  in AM and 40mg  in PM till 09/26/18, then 80mg  in AM from 09/27/18 till 10/03/18, then 40mg  in AM from 10/04/18 onwards 09/23/18   Aline August, MD  irbesartan (AVAPRO) 300 MG tablet Take 300 mg by mouth daily. Patient not taking: Reported on 11/08/2020 12/23/18   [provider]  lactulose (CHRONULAC) 10 GM/15ML solution TAKE 30 MILLILITERS BY MOUTH FOR 1 DOSE Patient not taking: Reported on 11/08/2020 09/29/18   [provider]  levothyroxine (SYNTHROID, LEVOTHROID) 125 MCG tablet Take 1 tablet (125 mcg total) by mouth daily before breakfast. Patient taking differently: Take 125 mcg by mouth daily. 11/07/14   Francesca Oman, DO  metFORMIN (GLUCOPHAGE) 1000 MG tablet Take 1,000 mg by mouth 2 (two) times daily. 02/01/19   [provider]  metFORMIN (GLUCOPHAGE) 500 MG tablet Take 250 mg by  mouth 2 (two) times daily with a meal.    [provider]  metoprolol (LOPRESSOR) 100 MG tablet Take 100 mg by mouth 2 (two) times daily.  10/28/14   [provider]  mometasone-formoterol (DULERA) 100-5 MCG/ACT AERO Inhale 2 puffs into the lungs 2 (two) times daily. Patient not taking: Reported on 11/08/2020 09/23/18   Aline August, MD  Multiple Vitamin (MULTIVITAMIN WITH MINERALS) TABS tablet Take 1 tablet by mouth daily. 11/07/14   Francesca Oman, DO  NIFEdipine (ADALAT CC) 30 MG 24 hr tablet Take 30 mg by mouth daily. 01/17/19   [provider]  NIFEdipine (PROCARDIA XL/ADALAT-CC) 30 MG 24 hr tablet Take 30 mg by mouth daily after supper.     [provider]  oxyCODONE-acetaminophen (PERCOCET) 10-325 MG tablet Take 1 tablet by mouth every 6 (six) hours as needed for pain.     [provider]  polyethylene glycol (MIRALAX / GLYCOLAX) 17 g packet Take 17 g by mouth 2 (two) times daily. 09/23/18   Aline August, MD  pregabalin (LYRICA) 75 MG capsule Take 75 mg by mouth 2 (two) times daily.  08/23/18   [provider]  senna-docusate (SENOKOT-S) 8.6-50 MG tablet Take 1 tablet by mouth 2 (two) times daily. Patient not taking: Reported on 11/08/2020 09/23/18   Aline August, MD  zinc sulfate 220 MG capsule Take 1 capsule (220 mg total) by mouth daily. 11/07/14   Francesca Oman, DO    Allergies    Ivp dye [iodinated diagnostic agents], Aspirin, Morphine and related, and Sulfa antibiotics  Review of Systems   Review of Systems  Constitutional:  Positive for chills. Negative for fever.  Neurological:  Positive for headaches.  Psychiatric/Behavioral:  Positive for confusion.    Physical Exam Updated Vital Signs BP (!) 110/50   Pulse 88   Temp (S) (!) 96 F (35.6 C) (Rectal) Comment: Dr. Tyrone Nine aware  Resp 16   Ht 5\' 3"  (1.6 m)   Wt 81.6 kg   SpO2 93%   BMI 31.87 kg/m   Physical Exam Constitutional:      General: She is awake.      Appearance: Normal appearance.     Comments: Patient was writhing in  pain, repeating "my head hurt"  HENT:     Head: Normocephalic and atraumatic.  Eyes:     Comments: Clear eye patch on right eye (recent cataract surgery)  Cardiovascular:     Rate and Rhythm: Normal rate and regular rhythm.     Heart sounds: Normal heart sounds.  Pulmonary:     Effort: Pulmonary effort is normal.     Breath sounds: Normal breath sounds.  Genitourinary:    Comments: Suprapubic catheter present; appears clean and dry Musculoskeletal:     Right lower leg: No edema.     Left lower leg: No edema.     Comments: Flaccid lower extremities with no sensation  Skin:    General: Skin is warm and dry.  Neurological:     Mental Status: She is disoriented and confused.     Comments: Patient is able to recall her name and location. Otherwise poor historian    ED Results / Procedures / Treatments   Labs (all labs ordered are listed, but only abnormal results are displayed) Labs Reviewed  CBC WITH DIFFERENTIAL/PLATELET - Abnormal; Notable for the following components:      Result Value   MCV 101.5 (*)    All other components within normal limits  COMPREHENSIVE METABOLIC PANEL - Abnormal; Notable for the following components:   Glucose, Bld 109 (*)    BUN 28 (*)    Creatinine, Ser 2.35 (*)    GFR, Estimated 21 (*)    Anion gap 17 (*)    All other components within normal limits  LACTIC ACID, PLASMA - Abnormal; Notable for the following components:   Lactic Acid, Venous 6.6 (*)    All other components within normal limits  LACTIC ACID, PLASMA - Abnormal; Notable for the following components:   Lactic Acid, Venous 2.7 (*)    All other components within normal limits  CULTURE, BLOOD (ROUTINE X 2)  CULTURE, BLOOD (ROUTINE X 2)  RESP PANEL BY RT-PCR (FLU A&B, COVID) ARPGX2  URINE CULTURE  URINALYSIS, ROUTINE W REFLEX MICROSCOPIC  CBG MONITORING, ED    EKG None  Radiology CT HEAD WO CONTRAST  (5MM)  Result Date: 12/26/2020 CLINICAL DATA:  Chronic headache, delirium, altered mental status EXAM: CT HEAD WITHOUT CONTRAST TECHNIQUE: Contiguous axial images were obtained from the base of the skull through the vertex without intravenous contrast. COMPARISON:  09/15/2018 FINDINGS: Brain: No evidence of acute infarction, hemorrhage, hydrocephalus, extra-axial collection or mass lesion/mass effect. Vascular: No hyperdense vessel or unexpected calcification. Skull: Normal. Negative for fracture or focal lesion. Sinuses/Orbits: No acute finding. Chronic mucosal thickening and bony sinus wall thickening of the left maxillary sinus, partially imaged, status post antrostomy. Other: None. IMPRESSION: 1. No acute intracranial pathology. No non-contrast CT findings to explain headache. 2. Chronic mucosal thickening and bony sinus wall thickening of the left maxillary sinus, partially imaged, status post antrostomy. Electronically Signed   By: Eddie Candle M.D.   On: 12/26/2020 11:15   DG CHEST PORT 1 VIEW  Result Date: 12/26/2020 CLINICAL DATA:  Altered mental status. EXAM: PORTABLE CHEST 1 VIEW COMPARISON:  None. FINDINGS: The heart size and mediastinal contours are within normal limits. Both lungs are clear. The visualized skeletal structures are unremarkable. IMPRESSION: No active disease. Electronically Signed   By: Kerby Moors M.D.   On: 12/26/2020 08:33    Procedures Procedures   Medications Ordered in ED Medications  prochlorperazine (COMPAZINE) injection 5 mg (5 mg Intravenous Given 12/26/20 0916)  lactated ringers infusion (  Intravenous New Bag/Given 12/26/20 1004)  vancomycin (VANCOREADY) IVPB 1500 mg/300 mL (1,500 mg Intravenous New Bag/Given 12/26/20 1132)  diphenhydrAMINE (BENADRYL) injection 12.5 mg (12.5 mg Intravenous Given 12/26/20 0914)  lactated ringers bolus 1,000 mL (0 mLs Intravenous Stopped 12/26/20 1030)    And  lactated ringers bolus 1,000 mL (0 mLs Intravenous Stopped 12/26/20  1110)    And  lactated ringers bolus 500 mL (500 mLs Intravenous New Bag/Given 12/26/20 1130)  ceFEPIme (MAXIPIME) 2 g in sodium chloride 0.9 % 100 mL IVPB (0 g Intravenous Stopped 12/26/20 0956)  metroNIDAZOLE (FLAGYL) IVPB 500 mg (0 mg Intravenous Stopped 12/26/20 1048)    ED Course  I have reviewed the triage vital signs and the nursing notes.  Pertinent labs & imaging results that were available during my care of the patient were reviewed by me and considered in my medical decision making (see chart for details).    MDM Rules/Calculators/A&P                           Altered Mental Status/ suspecting possible Sepsis Patient presented with altered mental status.  Patient is A&Ox1.  She is a poor historian unable to answer questions.  Patient would moan and groan to pain stating that her head hurts.  Vitals reveal temp 96 (bair hugger placed), normotensive, satting above 97%.  Initial labs reveal lactic acid level at 6.6.  CMP reveals elevated creatinine at 2.35. Chest x-ray unremarkable.  CT of head revealed No acute intracranial pathology. No non-contrast CT findings to explain headache. Blood cultures no growth <12 hours.  UA/culture pending.  Patient has a pre-existing sacral wound; recently treated for sacral decubitus osteomyelitis with 6wk course of IV Augmentin, last dose 12/23/2020.  Patient is wheelchair bound with suprapubic catheter present. Sacral wound and catheter placement possible source of infection. Code sepsis activated.   Final Clinical Impression(s) / ED Diagnoses Final diagnoses:  Altered behavior    Rx / DC Orders ED Discharge Orders     None        Timothy Lasso, MD 12/26/20 [place], [place], DO 12/26/20 1259

## 2020-12-26 NOTE — Progress Notes (Signed)
FPTS Brief Progress Note  S: Patient in bed stating her legs are blistering and burning. States sores on legs started when she got here. She denies headache or chest pain at this time. Would like to go stating she feels better. Paged by nursing staff for SBP 200.    O: BP (!) 210/90   Pulse 89   Temp (S) (!) 96 F (35.6 C) (Rectal) Comment: Dr. Tyrone Nine aware  Resp 14   Ht 5\' 3"  (1.6 m)   Wt 81.6 kg   SpO2 90%   BMI 31.87 kg/m   General: Appears ill and tired, no acute distress. Age appropriate. Cardiac: RRR, normal heart sounds, no murmurs Respiratory: normal effort Extremities: No LE edema Skin: Warm and dry, chronic dermatitis BLE. No open wounds on LE Neuro: alert    A/P: AMS- Unknown source of sepsis at this time.  HTN- Lopressor 100 mg, monitor. Consider IV medication if remains elevated. Parameters in orders.  - Orders reviewed. Labs for AM ordered, which was adjusted as needed.   Gerlene Fee, DO 12/26/2020, 9:52 PM PGY-3, [facility] Night Resident  Please page [phone] with questions.

## 2020-12-26 NOTE — ED Notes (Signed)
Pt's BP went above 200. On- call MD made aware.   MD instructed to administer metoprolol, and to page them again if BP doesn't go down.

## 2020-12-26 NOTE — ED Notes (Signed)
Pt in triage  

## 2020-12-26 NOTE — H&P (Addendum)
[facility] Admission History and Physical Service Pager: [phone]  Patient name: [name] Medical record number: [id number] Date of birth: 11-Aug-1950 Age: 70 y.o. Gender: female  Primary Care Provider: [name], MD Consultants: ID Code Status:  Full Preferred Emergency Contact: Dannial Monarch [phone]  Chief Complaint: Altered Mental Status  Assessment and Plan: [name] is a 70 y.o. female presenting with altered  mental status. PMH is significant for recurrent UTIs s/p suprapubic catheter, diastolic heart failure, chronic ischial osteomyelitis on amoxicillin, T2DM, GERD, hypothyroidism, paraplegia and HTN,.  Sepsis Patient came and meeting sepsis criteria and a code sepsis called.  Blood pressures have remained normotensive. She had a temperature of 96 on admission while later became febrile to 100.8, white blood cell count of 5.7, lactic acid of 6.6 (trended to 2.7), blood pressures admission more normotensive, and had altered mental status. Blood and cultures were obtained, respiratory panel was negative for COVID.  CT head without any acute abnormalities and chronic mucosal thickening and bony sinus wall thickening of left maxillary sinus.  Chest x-ray was normal. She was given 2.5L LR in the ED and started on LR mIVF. She was started on broad spectrum IV antibiotics of metronidazole, vancomycin, and cefepime. Diphenhydramine injection was also given. Physical exam showed patient generally appeared asleep and snoring. She was mildly apneic with some intermittent rigors on exam. Lungs sounded clear on examination. Abdomen soft and non tender on examination. Extremities were warm without edema, pulses hard to palpate in LE. Differential includes sepsis from her chronic ischial osteomyelitis which she is followed for in ID clinic, she has been on augmentin for this, and had a biopsy showing group B strep per previous ID notes. Quite possible that  infection still persisting due to inadequate antibiotic coverage. UTI s/p suprapubic catheter possible as well given patient has long history of this. Patient previously had Klebsiella UTI and bacteremia about 2 years ago. Pyelonephritis or intra-abdominal infection possible, but less likely as abdomen was soft and nontender when we were palpating. Patient had said she has some pain in her abdomen when we had sat her up. Sepsis unlikely to be related to post-op cataract infection as eye looked clear on examination and low risk surgical procedure. Pending blood cultures as symptoms possibly due to bacteremia. CXR unremarkable without consolidation and respiratory exam without focal findings, low concern for pneumonia. Low concern for GI perforation but getting flagyl for possible intraabdominal infection, abdominal exam normal. Given sepsis criteria along with altered mental state, CNS abnormalities including meningitis a possibility, no nuchal rigidity on exam, although still low on the differential list. Will admit for IV broad spectrum antibiotics as we await pending blood and urine cultures for possible infectious etiology.  -Admit to FPTS, attending Dr. Andria Frames -Continue broad spectrum antibiotics vancomycin, cefepime, and metronidazole -ID consult in am  -Monitor fever curve -Vital signs per floor protocol -LR @150  mL/hr -NPO pending SLP eval -DVT prophylaxis lovenox -F/u Urine culture -F/u Blood culture -narrow antibiotics when able as cultures result  -F/u Labs: Lactic acid, procalcitonin, UA, CBC, CMP -up with assistance -PT/OT eval & treat   Altered Mental Status Patient woke up and husband says she started counting backwards randomly, forgot her name and children's name and was very confused. EMS was called and brought her to the ED. She had a cararact surgery of her right eye yesterday and had received some low dose Versed for this and felt fine. After this she started to  feel very tired  and didn't take any of her medications at night.  On physical exam neurologically, patient had reactive pupils, and would intermittently awaken to stimulation but not as much voice. Had facial sensation intact and could respond to questions slowly. When awake and was A&Ox4. Ddx includes most likely mental status changes from septic infectious process. Differential also includes overdose on opioids as patient has home medication of percocet and was apneic on examination as well as drowsy. Other differentials include mental status changes after anesthesia with Versed from cataract surgery yesterday. Polypharmacy always a possibility. Metabolic encephalopathy less likely given electrolytes within normal range and not hypoglycemic. CT imaging lacks evidence of acute infarct and does not seem consistent with normal pressure hydrocephalus.  -UDS pending -consider Narcan injection prn if needed -Neuro checks q4h  -pending VBG -NPO pending speech eval -delirium precautions  -call husband in the morning for better history   Sacral decubitus ulcer  Hx of Chronic Ischial Osteomyelitis Sacral ulcer that is unstagable and concerning for osteomyelitis. She follows up with ID and has recently completed a 6 week IV augmentin course. At previous visit had been told that a diverting colostomy could to minimize risk of stool or bacteria getting into the wound.  -continue flagyl, cefepime and vancomycin  -ID consult am  -Wound care consult placed -pending blood cultures  -consider MRI -encourage repositioning   T2DM Glucose 79>109 on admission. Last HgbA1c recorded of 10.0 which was 2 years ago. Home medications include 1250 mg BID metformin.  -hold home metformin -sSSI  -monitor CBGs -f/u HgbA1c  AKI Cr 2.35 on admission, baseline appears to be around 1.1-1.3. Likely secondary to prerenal etiology possibly due to dehydration. Also a consideration is that patient has started vancomycin which we should try to  limit given its nephrotoxic properties. -continue LR @150  mL/hr -limit nephrotoxic agents when able  -hold home lasix given AKI  Diastolic Heart Failure Home medications include lasix  but unsure if this is a current medication. Most recent echo from 2020 notable for EF 50-55% with grade 1 diastolic dysfunction and impaired diastolic relaxation. No LE pitting edema or crackles noted on exam.  -hold home lasix given AKI -daily weights -monitor I/Os -consider repeat echo  GERD No home medications noted. -protonix as needed   Hypothyroidism Last TSH of 1.394 in June 2020. Home medication of levothyroxine 125 mcg daily. -continue home levothyroxine -am TSH  HTN Normotensive on admission but had multiple elevated pressures with systolic up to 790W during encounter. Home medications include metoprolol and nifedipine. -continue metoprolol -hold nifedipine to prevent hypotensive episodes, will restart when able   Paraplegia  cervical stenosis Home medications include baclofen, pregabalin and percocet.  -hold home meds, restart when able given sedating properties in the setting of altered mental status   Nephrogenic bladder  Recurrent UTIs Patient presents with existing suprapubic catheter in place. History of recurrent UTIs, previous urine cultures have returned with Klebsiella. -pending UA -pending urine cx   FEN/GI: NPO until SLP eval Prophylaxis: Lovenox 30 mg  Disposition: admit to progressive, attending Dr. Andria Frames   History of Present Illness:  [name] is a 70 y.o. female presenting with altered mental status and meeting sepsis criteria. Presented after experiencing an episode of altered mental status and was brought to the ED by her husband. She cannot think of the last thing she remembers and is unsure of why she is here, she consistently shares that she wants to go home. She denies  any pain. She wakes up intermittently during exam and falls right back to sleep.  Per ED provider, she had cataracts surgery of her right eye yesterday. Called the husband, with patient's permission, to get further history with permission of patient but no answer and voicemail not setup so unable to leave message.    Both history and physical exam limited by patient's mental status and cooperation.   Review Of Systems: Per HPI with the following additions:   Review of Systems  Psychiatric/Behavioral:  Positive for confusion.     ROS limited due to patient's mental status and cooperation, unable to get in touch with husband.   Patient Active Problem List   Diagnosis Date Noted   Sepsis ([place]) 12/26/2020   Renal insufficiency 12/11/2020   Group B streptococcal infection 11/08/2020   Foley catheter in place 02/03/2019   GERD (gastroesophageal reflux disease) 02/03/2019   Hard to intubate 02/03/2019   Poor vision 02/03/2019   Former smoker 01/26/2019   Neuropathy 01/26/2019   Adjustment disorder with disturbance of emotion 12/02/2018   Palliative care by specialist    Advanced care planning/counseling discussion    Spondylosis, cervical, with myelopathy    Left perineal ischial pressure ulcer 09/16/2018   Diabetes type 2, uncontrolled ([place]) 04/26/2017   Urinary tract infection associated with cystostomy catheter ([place])    Medication monitoring encounter 12/03/2015   Hypertension    History of nephrolithiasis    AKI (acute kidney injury) ([place]) 08/10/2015   Chronic osteomyelitis of pelvic region, left ([place]) 11/06/2014   Pressure injury of skin of sacral region    Paraplegia ([place]) 11/01/2014   Hypothyroidism 10/31/2014   Thoracic stenosis 08/23/2014   Breast cancer of lower-inner quadrant of right female breast ([place]) 12/22/2012   HTN (hypertension) 04/15/2012   Left knee DJD 04/13/2012    Class: Chronic    Past Medical History: Past Medical History:  Diagnosis Date   Anemia    Arthritis    "all over" (04/13/2012)   Cancer of right breast ([place]) 2011    Cervical neuropathy    PERIPHERAL NEUROPATHY , HAD CERVICAL FUSION   Diabetes mellitus without complication ([place])    Difficult intubation    Fastrach #4 LMA then # 7 ETT used in 2006 [facility], cervical laminectomy)   GERD (gastroesophageal reflux disease)    Group B streptococcal infection 11/08/2020   History of kidney stones    Hypercholesteremia    Hypertension    Hypothyroidism    Kidney stones    Neurogenic bladder    Osteomyelitis ([facility])    Paraplegia ([facility])    Peripheral neuropathy    PONV (postoperative nausea and vomiting)    Renal insufficiency 12/11/2020   Sacral decubitus ulcer 10/2014   Sepsis due to urinary tract infection [facility])     Past Surgical History: Past Surgical History:  Procedure Laterality Date   ABDOMINAL HYSTERECTOMY  ~ [facility] Right 2011   BREAST LUMPECTOMY Right 2011   CALDWELL LUC  ~ 2003   "benign tumor removed from up under gum" (04/13/2012)   DACROCYSTORHINOSTOMY  ~ 2000   "put stents in my tear ducts; both eyes" (04/13/2012)   EYE SURGERY  2004   STENTS TO BIL EYES   I & D EXTREMITY Bilateral 11/03/2014   Procedure: IRRIGATION AND DEBRIDEMENT BILATERAL HEEL;  Surgeon: Irene Limbo, MD;  Location: [place];  Service: Plastics;  Laterality: Bilateral;   INCISION AND DRAINAGE OF WOUND  Left 11/03/2014   Procedure: IRRIGATION AND DEBRIDEMENT SACRAL ULCER;  Surgeon: Irene Limbo, MD;  Location: [place];  Service: Plastics;  Laterality: Left;   JOINT REPLACEMENT     POSTERIOR CERVICAL LAMINECTOMY  2006   SKIN SPLIT GRAFT Left 08/05/2016   Procedure: SURGICAL PREP  FOR GRAFTING APPLICATION ACELL TO LEFT ISCHIUM;  Surgeon: Irene Limbo, MD;  Location: [place];  Service: Plastics;  Laterality: Left;   TONSILLECTOMY AND ADENOIDECTOMY  ~ 1954   TOTAL KNEE ARTHROPLASTY WITH REVISION COMPONENTS  04/13/2012   Procedure: TOTAL KNEE ARTHROPLASTY WITH REVISION COMPONENTS;  Surgeon: Hessie Dibble, MD;  Location: [place];  Service:  Orthopedics;  Laterality: Left;    Social History: Social History   Tobacco Use   Smoking status: Former    Packs/day: 0.50    Years: 50.00    Pack years: 25.00    Types: Cigarettes    Quit date: 11/06/2014    Years since quitting: 6.1   Smokeless tobacco: Never  Substance Use Topics   Alcohol use: No   Drug use: No   Additional social history:   Please also refer to relevant sections of EMR.  Family History: Family History  Problem Relation Age of Onset   Heart disease Father     Allergies and Medications: Allergies  Allergen Reactions   Ivp Dye [Iodinated Diagnostic Agents] Other (See Comments)    "hot and sweaty and almost passed out"   Aspirin Hives   Morphine And Related Itching and Rash   Sulfa Antibiotics Hives   No current facility-administered medications on file prior to encounter.   Current Outpatient Medications on File Prior to Encounter  Medication Sig Dispense Refill   Ascorbic Acid (VITAMIN C) 1000 MG tablet Take 1,000 mg by mouth daily.     baclofen (LIORESAL) 20 MG tablet Take 20 mg by mouth every 6 (six) hours as needed for muscle spasms.   0   bisacodyl (DULCOLAX) 10 MG suppository Place 1 suppository (10 mg total) rectally daily as needed for severe constipation. 12 suppository 0   Bromfenac Sodium (BROMSITE) 0.075 % SOLN Place 1 drop into the right eye daily.     [employer] SENNA 8.6 MG tablet Take 2 tablets by mouth daily as needed for constipation.     docusate sodium (COLACE) 100 MG capsule Take 100 mg by mouth 2 (two) times daily as needed (FOR CONSTIPATION).      DULoxetine (CYMBALTA) 60 MG capsule Take 60-120 mg by mouth daily.     ferrous sulfate 325 (65 FE) MG tablet Take 325 mg by mouth daily after supper.      folic acid (FOLVITE) 1 MG tablet Take 1 mg by mouth daily.      furosemide (LASIX) 40 MG tablet 80mg  in AM and 40mg  in PM till 09/26/18, then 80mg  in AM from 09/27/18 till 10/03/18, then 40mg  in AM from 10/04/18 onwards (Patient taking  differently: Take 40 mg by mouth daily.) 60 tablet 0   levothyroxine (SYNTHROID, LEVOTHROID) 125 MCG tablet Take 1 tablet (125 mcg total) by mouth daily before breakfast. (Patient taking differently: Take 125 mcg by mouth daily.) 30 tablet 0   metFORMIN (GLUCOPHAGE) 1000 MG tablet Take 1,000 mg by mouth 2 (two) times daily.     metoprolol (LOPRESSOR) 100 MG tablet Take 100 mg by mouth 2 (two) times daily.      Multiple Vitamin (MULTIVITAMIN) capsule Take 1 capsule by mouth daily.     NIFEdipine (PROCARDIA XL/ADALAT-CC) 30  MG 24 hr tablet Take 30 mg by mouth daily after supper.      oxyCODONE-acetaminophen (PERCOCET) 10-325 MG tablet Take 1 tablet by mouth every 6 (six) hours as needed for pain.      polyethylene glycol (MIRALAX / GLYCOLAX) 17 g packet Take 17 g by mouth 2 (two) times daily. (Patient taking differently: Take 17 g by mouth daily as needed for mild constipation.) 30 each 0   pregabalin (LYRICA) 75 MG capsule Take 75 mg by mouth 2 (two) times daily.      Zinc Sulfate 220 (50 Zn) MG TABS Take 50 mg by mouth daily.     amoxicillin-clavulanate (AUGMENTIN) 875-125 MG tablet Take 1 tablet by mouth 2 (two) times daily. 42 tablet 1   mometasone-formoterol (DULERA) 100-5 MCG/ACT AERO Inhale 2 puffs into the lungs 2 (two) times daily. (Patient not taking: No sig reported) 1 g 0    Objective: BP (!) 192/82   Pulse 99   Temp (S) (!) 96 F (35.6 C) (Rectal) Comment: Dr. Tyrone Nine aware  Resp 11   Ht 5\' 3"  (1.6 m)   Wt 81.6 kg   SpO2 92%   BMI 31.87 kg/m  Exam: General: AA Female, NAD, intermittent rigors, snoring when intermittently asleep Eyes: Pupils reactive to light, right eye protection in place, conjunctiva clear, no purulent discharge, blinks Head: Normocephalic, atraumatic Neck: no nuchal rigidity  Cardiovascular: RRR no m/r/g Respiratory: CTAB, no iWOB, mildly apneic Gastrointestinal: Nontender to palpation, normoactive bowel sounds, soft, non-distended MSK: Legs flaccid   Derm: hypopigmented lesion on left near hairline Neuro: PERRL, responsive to voice, when awake A&Ox4 Psych: Sedated, tired appearing  Labs and Imaging: CBC BMET  Recent Labs  Lab 12/26/20 0644  WBC 5.7  HGB 12.6  HCT 41.5  PLT 271   Recent Labs  Lab 12/26/20 0644  NA 138  K 3.9  CL 98  CO2 23  BUN 28*  CREATININE 2.35*  GLUCOSE 109*  CALCIUM 9.1     EKG: My own interpretation (not copied from electronic read)   CT HEAD WO CONTRAST (5MM)  Result Date: 12/26/2020 CLINICAL DATA:  Chronic headache, delirium, altered mental status EXAM: CT HEAD WITHOUT CONTRAST TECHNIQUE: Contiguous axial images were obtained from the base of the skull through the vertex without intravenous contrast. COMPARISON:  09/15/2018 FINDINGS: Brain: No evidence of acute infarction, hemorrhage, hydrocephalus, extra-axial collection or mass lesion/mass effect. Vascular: No hyperdense vessel or unexpected calcification. Skull: Normal. Negative for fracture or focal lesion. Sinuses/Orbits: No acute finding. Chronic mucosal thickening and bony sinus wall thickening of the left maxillary sinus, partially imaged, status post antrostomy. Other: None. IMPRESSION: 1. No acute intracranial pathology. No non-contrast CT findings to explain headache. 2. Chronic mucosal thickening and bony sinus wall thickening of the left maxillary sinus, partially imaged, status post antrostomy. Electronically Signed   By: Eddie Candle M.D.   On: 12/26/2020 11:15   DG CHEST PORT 1 VIEW  Result Date: 12/26/2020 CLINICAL DATA:  Altered mental status. EXAM: PORTABLE CHEST 1 VIEW COMPARISON:  None. FINDINGS: The heart size and mediastinal contours are within normal limits. Both lungs are clear. The visualized skeletal structures are unremarkable. IMPRESSION: No active disease. Electronically Signed   By: Kerby Moors M.D.   On: 12/26/2020 08:33      Gerrit Heck, MD 12/26/2020, 5:28 PM PGY-1, [employer] Intern  pager: [phone], text pages welcome   I was personally present and performed or re-performed the history, physical  exam and medical decision making activities of this service and have verified that the service and findings are accurately documented in the resident's note. My edits are noted within the note, please also see attending's attestation and notes.   Donney Dice, DO                  12/26/2020, 5:49 PM  PGY-2, [place]

## 2020-12-26 NOTE — ED Notes (Signed)
Foley bag changed

## 2020-12-26 NOTE — Progress Notes (Signed)
Pharmacy Antibiotic Note  [name] is a 70 y.o. female admitted on 12/26/2020 with sepsis.  Pharmacy has been consulted for vancomycin and cefepime dosing.  Plan: Vancomycin 1500mg  x1 then vanc variable given Cr of 2.35 - elevated from bsl of 0.9-1.1mg /dL  Cefepime 2g IV q24h -Monitor renal function, clinical status, and antibiotic plan -Order vanc levels as necessary  Height: 5\' 3"  (160 cm) Weight: 81.6 kg (179 lb 14.3 oz) IBW/kg (Calculated) : 52.4  Temp (24hrs), Avg:96 F (35.6 C), Min:96 F (35.6 C), Max:96 F (35.6 C)  Recent Labs  Lab 12/26/20 0644  WBC 5.7  CREATININE 2.35*  LATICACIDVEN 6.6*    Estimated Creatinine Clearance: 21.9 mL/min (A) (by C-G formula based on SCr of 2.35 mg/dL (H)).    Allergies  Allergen Reactions   Ivp Dye [Iodinated Diagnostic Agents] Other (See Comments)    "hot and sweaty and almost passed out"   Aspirin Hives   Morphine And Related Itching and Rash   Sulfa Antibiotics Hives    Antimicrobials this admission: Vanc 9/21 >>  Cefepime 9/21 >>   Dose adjustments this admission: N/A  Microbiology results: 9/21 BCx:   Thank you for allowing pharmacy to be a part of this patient's care.  Joetta Manners, PharmD, [facility] Emergency Medicine Clinical Pharmacist ED RPh Phone: [place]: [phone]

## 2020-12-26 NOTE — ED Notes (Signed)
Pt and husband took off bear hugger. Pt c/o being hot.

## 2020-12-26 NOTE — ED Notes (Signed)
Messaged both Dr. Tyrone Nine and Dr. Jeanice Lim regarding Lactic Acid of 6.6.

## 2020-12-26 NOTE — ED Notes (Signed)
Pt's BP went above 200s.

## 2020-12-26 NOTE — ED Notes (Addendum)
CBG 79 

## 2020-12-26 NOTE — ED Notes (Signed)
Nyelle Wolfson husband [phone] requesting an update

## 2020-12-26 NOTE — Progress Notes (Signed)
IVT was consulted for PIV's earlier this morning, this IV nurse was able obtain 1 successful with the ultrasound and attempted 2 more times. Pt right arm is restricted and no other site on the left arm is appropriate for a PIV.  Afternoon RN consulted for another IV and was told also that with the one extremity we can use, there were no appropriate veins for [place] to stick for a PIV, RN understood and was going to discuss with MD.

## 2020-12-26 NOTE — ED Provider Notes (Signed)
Emergency Medicine Provider Triage Evaluation Note  [name] , a 70 y.o. female  was evaluated in triage.  Pt complains of altered mental status.  Brought in by EMS.  EMS reports that family states that the patient had altered mental status as of this morning.  She is normally alert and oriented x4 and can follow commands.  She is only oriented to self at this time.  She is confused and withdrawing from pain.  She had cataract surgery yesterday.  She was previously on antibiotics secondary to osteomyelitis.  She has a history of bacteremia secondary to UTI.  She has a pressure leg on her left wound.  In the ED, the patient states "I'm cold I'm cold."  When asked her name, she is able to state "[name]."  No other information provided by patient at this time.  EMS reports that she was not hypoglycemic in route.  No fever and she was not tachycardic.  Level 5 caveat secondary to altered mental status.  Review of Systems  Level 5 caveat secondary to altered mental status.  Physical Exam  SpO2 98%  Gen:   Eyes are closed.  Moans in pain. Resp:  Normal effort  MSK:   Extremities are cool to the touch Other:  Plastic shield over the right eye, likely secondary to cataract surgery.  Oriented to self only.  Medical Decision Making  Medically screening exam initiated at 6:26 AM.  Appropriate orders placed.  [name] was informed that the remainder of the evaluation will be completed by another provider, this initial triage assessment does not replace that evaluation, and the importance of remaining in the ED until their evaluation is complete.   Informed triage nurse that patient is not appropriate for the waiting room.  She will require a room.  Will order labs, urinalysis.  She needs a rectal temp.   Joanne Gavel, PA-C 12/26/20 0631    Ezequiel Essex, MD 12/26/20 [phone]

## 2020-12-26 NOTE — Progress Notes (Addendum)
FPTS Interim Progress Note  Spoke with daughter, Gerhard Perches, about patient's care. Daughter concerned that day team had not called the family to update them about patient care. Daughter also concerned that patient eye drops are in the room, and she doesn't want them to be lost. I explained to the daughter that we were still searching for a cause of her AMS, and that we'd found her to have an infection that we are treating and searching for a cause. Daughter was understanding and agreeable. Also informed daughter that day team had attempted to call husband, Cerena Baine, several times but had not gotten a hold of him. Daughter explained that husband has hearing aids, and may not have heard the call. I also told daughter that I wold call the nurse for the patients room and ask them to set aside eye-drops.   Called ED and spoke to patient's nurse. Nurse said she was aware, and that the family had called the unit secretary.   Holley Bouche, MD 12/26/2020, 8:25 PM PGY-1, [place] [facility] Service pager [phone]

## 2020-12-27 ENCOUNTER — Other Ambulatory Visit: Payer: Self-pay

## 2020-12-27 ENCOUNTER — Encounter (HOSPITAL_COMMUNITY): Payer: Self-pay | Admitting: Family Medicine

## 2020-12-27 ENCOUNTER — Inpatient Hospital Stay (HOSPITAL_COMMUNITY): Payer: Medicare Other

## 2020-12-27 DIAGNOSIS — R4689 Other symptoms and signs involving appearance and behavior: Secondary | ICD-10-CM | POA: Diagnosis not present

## 2020-12-27 DIAGNOSIS — R6520 Severe sepsis without septic shock: Secondary | ICD-10-CM | POA: Diagnosis not present

## 2020-12-27 DIAGNOSIS — L89154 Pressure ulcer of sacral region, stage 4: Secondary | ICD-10-CM | POA: Diagnosis not present

## 2020-12-27 DIAGNOSIS — G9340 Encephalopathy, unspecified: Secondary | ICD-10-CM | POA: Diagnosis not present

## 2020-12-27 DIAGNOSIS — G8220 Paraplegia, unspecified: Secondary | ICD-10-CM | POA: Diagnosis not present

## 2020-12-27 DIAGNOSIS — A419 Sepsis, unspecified organism: Secondary | ICD-10-CM | POA: Diagnosis not present

## 2020-12-27 LAB — HEMOGLOBIN A1C
Hgb A1c MFr Bld: 5.7 % — ABNORMAL HIGH (ref 4.8–5.6)
Hgb A1c MFr Bld: 5.7 % — ABNORMAL HIGH (ref 4.8–5.6)
Mean Plasma Glucose: 117 mg/dL
Mean Plasma Glucose: 117 mg/dL

## 2020-12-27 LAB — TSH: TSH: 0.961 u[IU]/mL (ref 0.350–4.500)

## 2020-12-27 LAB — CBG MONITORING, ED: Glucose-Capillary: 111 mg/dL — ABNORMAL HIGH (ref 70–99)

## 2020-12-27 LAB — COMPREHENSIVE METABOLIC PANEL
ALT: 21 U/L (ref 0–44)
AST: 20 U/L (ref 15–41)
Albumin: 3.3 g/dL — ABNORMAL LOW (ref 3.5–5.0)
Alkaline Phosphatase: 47 U/L (ref 38–126)
Anion gap: 10 (ref 5–15)
BUN: 23 mg/dL (ref 8–23)
CO2: 26 mmol/L (ref 22–32)
Calcium: 9.1 mg/dL (ref 8.9–10.3)
Chloride: 105 mmol/L (ref 98–111)
Creatinine, Ser: 1.59 mg/dL — ABNORMAL HIGH (ref 0.44–1.00)
GFR, Estimated: 34 mL/min — ABNORMAL LOW (ref >60)
Glucose, Bld: 107 mg/dL — ABNORMAL HIGH (ref 70–99)
Potassium: 4.2 mmol/L (ref 3.5–5.1)
Sodium: 141 mmol/L (ref 135–145)
Total Bilirubin: 0.8 mg/dL (ref 0.3–1.2)
Total Protein: 7 g/dL (ref 6.5–8.1)

## 2020-12-27 LAB — GLUCOSE, CAPILLARY
Glucose-Capillary: 102 mg/dL — ABNORMAL HIGH (ref 70–99)
Glucose-Capillary: 103 mg/dL — ABNORMAL HIGH (ref 70–99)
Glucose-Capillary: 128 mg/dL — ABNORMAL HIGH (ref 70–99)

## 2020-12-27 LAB — PROCALCITONIN: Procalcitonin: 0.56 ng/mL

## 2020-12-27 LAB — CBC
HCT: 37.6 % (ref 36.0–46.0)
Hemoglobin: 11.9 g/dL — ABNORMAL LOW (ref 12.0–15.0)
MCH: 30.4 pg (ref 26.0–34.0)
MCHC: 31.6 g/dL (ref 30.0–36.0)
MCV: 96.2 fL (ref 80.0–100.0)
Platelets: 253 10*3/uL (ref 150–400)
RBC: 3.91 MIL/uL (ref 3.87–5.11)
RDW: 13.4 % (ref 11.5–15.5)
WBC: 5.1 10*3/uL (ref 4.0–10.5)
nRBC: 0 % (ref 0.0–0.2)

## 2020-12-27 MED ORDER — SODIUM CHLORIDE 0.9 % IV SOLN
2.0000 g | Freq: Two times a day (BID) | INTRAVENOUS | Status: DC
  Administered 2020-12-27 – 2020-12-28 (×3): 2 g via INTRAVENOUS
  Filled 2020-12-27 (×3): qty 2

## 2020-12-27 MED ORDER — BACLOFEN 10 MG PO TABS
10.0000 mg | ORAL_TABLET | Freq: Four times a day (QID) | ORAL | Status: DC | PRN
  Administered 2020-12-27 – 2020-12-28 (×2): 10 mg via ORAL
  Filled 2020-12-27 (×2): qty 1

## 2020-12-27 MED ORDER — VANCOMYCIN HCL 500 MG/100ML IV SOLN
500.0000 mg | INTRAVENOUS | Status: DC
  Administered 2020-12-27: 500 mg via INTRAVENOUS
  Filled 2020-12-27 (×2): qty 100

## 2020-12-27 MED ORDER — NIFEDIPINE ER OSMOTIC RELEASE 30 MG PO TB24
30.0000 mg | ORAL_TABLET | Freq: Every day | ORAL | Status: DC
  Administered 2020-12-27 – 2020-12-28 (×2): 30 mg via ORAL
  Filled 2020-12-27 (×2): qty 1

## 2020-12-27 MED ORDER — LABETALOL HCL 5 MG/ML IV SOLN
20.0000 mg | Freq: Once | INTRAVENOUS | Status: AC
  Administered 2020-12-27: 20 mg via INTRAVENOUS
  Filled 2020-12-27: qty 4

## 2020-12-27 MED ORDER — ENOXAPARIN SODIUM 40 MG/0.4ML IJ SOSY
40.0000 mg | PREFILLED_SYRINGE | INTRAMUSCULAR | Status: DC
  Administered 2020-12-27: 40 mg via SUBCUTANEOUS
  Filled 2020-12-27: qty 0.4

## 2020-12-27 MED ORDER — SODIUM CHLORIDE 0.9 % IV SOLN: INTRAVENOUS | Status: DC

## 2020-12-27 MED ORDER — DULOXETINE HCL 30 MG PO CPEP
30.0000 mg | ORAL_CAPSULE | Freq: Every day | ORAL | Status: DC
  Administered 2020-12-27 – 2020-12-28 (×2): 30 mg via ORAL
  Filled 2020-12-27 (×2): qty 1

## 2020-12-27 MED ORDER — OXYCODONE HCL 5 MG PO TABS
5.0000 mg | ORAL_TABLET | Freq: Four times a day (QID) | ORAL | Status: DC | PRN
Start: 2020-12-27 — End: 2020-12-28
  Administered 2020-12-27 – 2020-12-28 (×2): 5 mg via ORAL
  Filled 2020-12-27 (×2): qty 1

## 2020-12-27 NOTE — ED Notes (Signed)
Bed assigned greater than 1.5 hours. No purple man visible. Attempted to call phone report. Nurse unavailable at this time.

## 2020-12-27 NOTE — Evaluation (Signed)
Clinical/Bedside Swallow Evaluation Patient Details  Name: [name] MRN: [id number] Date of Birth: February 01, 1951  Today's Date: 12/27/2020 Time: SLP Start Time (ACUTE ONLY): 0920 SLP Stop Time (ACUTE ONLY): 0940 SLP Time Calculation (min) (ACUTE ONLY): 20 min  Past Medical History:  Past Medical History:  Diagnosis Date   Anemia    Arthritis    "all over" (04/13/2012)   Cancer of right breast ([place]) 2011   Cervical neuropathy    PERIPHERAL NEUROPATHY , HAD CERVICAL FUSION   Diabetes mellitus without complication ([place])    Difficult intubation    Fastrach #4 LMA then # 7 ETT used in 2006 [facility], cervical laminectomy)   GERD (gastroesophageal reflux disease)    Group B streptococcal infection 11/08/2020   History of kidney stones    Hypercholesteremia    Hypertension    Hypothyroidism    Kidney stones    Neurogenic bladder    Osteomyelitis ([place])    Paraplegia ([place])    Peripheral neuropathy    PONV (postoperative nausea and vomiting)    Renal insufficiency 12/11/2020   Sacral decubitus ulcer 10/2014   Sepsis due to urinary tract infection [facility])    Past Surgical History:  Past Surgical History:  Procedure Laterality Date   ABDOMINAL HYSTERECTOMY  ~ [place] Right 2011   BREAST LUMPECTOMY Right 2011   CALDWELL LUC  ~ 2003   "benign tumor removed from up under gum" (04/13/2012)   DACROCYSTORHINOSTOMY  ~ 2000   "put stents in my tear ducts; both eyes" (04/13/2012)   EYE SURGERY  2004   STENTS TO BIL EYES   I & D EXTREMITY Bilateral 11/03/2014   Procedure: IRRIGATION AND DEBRIDEMENT BILATERAL HEEL;  Surgeon: Irene Limbo, MD;  Location: [place];  Service: Plastics;  Laterality: Bilateral;   INCISION AND DRAINAGE OF WOUND Left 11/03/2014   Procedure: IRRIGATION AND DEBRIDEMENT SACRAL ULCER;  Surgeon: Irene Limbo, MD;  Location: [place];  Service: Plastics;  Laterality: Left;   JOINT REPLACEMENT     POSTERIOR CERVICAL LAMINECTOMY  2006   SKIN  SPLIT GRAFT Left 08/05/2016   Procedure: SURGICAL PREP  FOR GRAFTING APPLICATION ACELL TO LEFT ISCHIUM;  Surgeon: Irene Limbo, MD;  Location: [place];  Service: Plastics;  Laterality: Left;   TONSILLECTOMY AND ADENOIDECTOMY  ~ 1954   TOTAL KNEE ARTHROPLASTY WITH REVISION COMPONENTS  04/13/2012   Procedure: TOTAL KNEE ARTHROPLASTY WITH REVISION COMPONENTS;  Surgeon: Hessie Dibble, MD;  Location: [place];  Service: Orthopedics;  Laterality: Left;   HPI:  [name] is a 70 y.o. female presenting with altered  mental status. PMH is significant for recurrent UTIs s/p suprapubic catheter, diastolic heart failure, chronic ischial osteomyelitis on amoxicillin, T2DM, GERD, hypothyroidism, paraplegia and HTN,.    Assessment / Plan / Recommendation  Clinical Impression  Pt demonstrates no signs of aspiration and denies dysphagia. Able to masticate despite missing dentition. Will resume a regular diet and thin liquids. Will sign off.      Aspiration Risk       Diet Recommendation Regular;Thin liquid   Liquid Administration via: Cup;Straw    Other  Recommendations      Recommendations for follow up therapy are one component of a multi-disciplinary discharge planning process, led by the attending physician.  Recommendations may be updated based on patient status, additional functional criteria and insurance authorization.  Follow up Recommendations        Frequency and  Duration            Prognosis        Swallow Study   General HPI: [name] is a 70 y.o. female presenting with altered  mental status. PMH is significant for recurrent UTIs s/p suprapubic catheter, diastolic heart failure, chronic ischial osteomyelitis on amoxicillin, T2DM, GERD, hypothyroidism, paraplegia and HTN,. Type of Study: Bedside Swallow Evaluation Diet Prior to this Study: NPO Temperature Spikes Noted: No History of Recent Intubation: No Behavior/Cognition: Cooperative;Alert Oral Cavity  Assessment: Within Functional Limits Oral Care Completed by SLP: No Oral Cavity - Dentition: Poor condition;Missing dentition Vision: Functional for self-feeding Self-Feeding Abilities: Able to feed self Patient Positioning: Upright in bed Baseline Vocal Quality: Normal Volitional Cough: Strong Volitional Swallow: Able to elicit    Oral/Motor/Sensory Function Overall Oral Motor/Sensory Function: Within functional limits   Ice Chips     Thin Liquid Thin Liquid: Within functional limits    Nectar Thick Nectar Thick Liquid: Not tested   Honey Thick Honey Thick Liquid: Not tested   Puree Puree: Within functional limits   Solid     Solid: Within functional limits      [name], [name] 12/27/2020,1:33 PM

## 2020-12-27 NOTE — Hospital Course (Addendum)
[name] is a 70 year old female presenting on 9/21 with altered mental status, thought to be septic and was admitted to [facility]. Past medical history is significant for cervical spine surgery with resulting paraplegia, neurogenic bladder with suprapubic catheter with recurrent UTIs, diastolic heart failure, chronic ischial osteomyelitis, type 2 diabetes, GERD, hypothyroidism, hypertension, and right sided breast cancer.   Delirium concern for sepsis  [name] reportedly woke up 9/21 with AMS. Patient had right eye cataract surgery on 9/20 and family was concerned medications related to procedure could be contributing to patient's presentation. On admission to the ED she was found to have a lactate of 6.6, temp of 96 (later fevered at 100.8), and she was somnolent with intermittent apnea and rigors. Notable negatives include CTH, CXR, UDS, urine culture 10k gnr and 30k pseudomonas, blood cultures with no growth at 24 hours. UA negative for nitrites, no white blood cells. CT A/P showed soft tissue defect extending to the ischium. Possible sources of infection considered were chronic ischial osteo (just finished IV Augmentin on the 18th) or indwelling catheter (previous infection with klebsiella). She was started on broad spectrum IV ABX (vanc/cefepime/flagyl) on 9/21 and given 2.5L LR. Patient's mental status is significantly improved: she is fully alert and oriented with intact attention stating the days of the week backwards and answering ICU-CAM thought organization questions correctly. Less likely infectious syndrome per ID and less likely bacteremia, via translocation from the sacral ulcer, given blood cultures with no growth at 24 hours. Given quick improvement in AMS, potential etiology of delirium could be related to anesthesia from recent right eye cataract surgery. Patient's husband has been closely involved in the care of the patient and agrees she has returned to  baseline. Blood culture negative, discontinued vanc/cefepime/flagyl per ID. Patient cleared by speech pathology for regular diet. PT/OT saw the patient and had no further recommendations.     Hypertension, tachycardia Patient initially normotensive, however, became hypertensive after several hours in the emergency room. Currently on home metoprolol tartrate 100 mg BID and nifedipine 30 mg. S/p two doses of IV labetalol 20 mg. BP of 185/77 at 7 AM. 148/76 at 9 AM. Notably patient has also developed tachycardia, low concern for PE given lack of pleuritic CP and no significant SoB. Patient does not appear septic anymore, so that does not appear to be a likely explanation. EKG on 9/22 demonstrates sinus tachycardia, with nonspecific ST and T wave changes, and left ventricular hypertrophy. Hypertensive over the night of 9/23 SBP (174-203) and DBP (89-148) with tachycardia to 104, given additional Nifedipine 30 mg. Hypertension could be due to decrease in home pain medication regimen which was held on admission and restarted at a lower dose due to concerns for AMS, sedation, and respiratory depression. Will restart home pain management regimen today. Continue home Nifedipine 30 mg and Metoprolol 100 mg BID.    AKI On admission was 2.35, down to 1.42 this AM, s/p 2.5L LR. Baseline Cr likely 1.0-1.2. Likely pre-renal etiology given improvement with fluids.    Paraplegia  cervical stenosis Home medications include Baclofen 20 mg q6h PRN, Pregabalin 75 mg BID daily, Cymbalta 60 mg daily, and Oxycodone. Initially held given concern for AMS Patient has muscle spasms bilaterally in lower extremities throughout the day and night, worse in the evening. Spasms present on physical examination. Start Oxycodone 10 mg PRN q6hrs for severe pain, Pregabalin 25 mg once BID, Baclofen 20 mg q6h PRN for muscle spasms, Cymbalta at 30  mg, reduced dose.    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 Patient follows with Dr. Tommy Medal as an outpatient. Recently completed an outpatient 6 week IV course of Augmentin for ischial osteo growing group B strep on biopsy. Pictures are in the media tab. Was seen by wound care and recommendations for outpatient care.   T2DM Hemoglobin A1C 2 yrs ago of 10.0. On Metformin 1250 mg BID. A1C on admission of 5.7. CBGs ranged from 102-128. Restart Metformin on discharge.    HFpEF Home medications include lasix, which the patient says she has been taking. Most recent echo from 2020 notable for EF 50-55% with grade 1 diastolic dysfunction and impaired diastolic relaxation. No LE pitting edema or crackles noted on exam. Recommended holding Lasix. Echo is scheduled.    Neurogenic bladder, recurrent UTIs Suprapubic catheter in place. Previous Klebsiella UTIs. Urine culture 10k gnr and 30k pseudomonas.    Hypothyroidism Last TSH of 1.394 in June 2020. Home medication of levothyroxine 125 mcg daily. TSH WNL on admission labs. Continue home levothyroxine upon discharge.   Issues to Follow Up:  Continue to monitor BP and follow up with PCP Dr. [name] as soon as possible. May need to increase Nifedipine to 60 mg daily for better blood pressure control.  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 Appointment scheduled with Dr. Linton Ham on 01/08/2021.  Continue outpatient care with infectious disease. Next appointment scheduled with Dr. [name] for 02/13/21.

## 2020-12-27 NOTE — Progress Notes (Signed)
Pharmacy Antibiotic Note  [name] is a 70 y.o. female admitted on 12/26/2020 with sepsis.  Pharmacy has been consulted for vancomycin and cefepime dosing. Also on metronidazole per team.  Calculated AUC 464, Scr used 1.59 Renal function improving Scr 2.35 >> 1.59. CrCl 32, with expected improvement with fluids. WBC wnl  Plan: Vancomycin 1500mg  x1 loading dose followed by vancomycin 500mg  IV Q24H. Marland Kitchen  Cefepime 2g IV q12h - Monitor renal function, clinical status - Narrow abx when possible - Vanc levels as necessary  Height: 5\' 3"  (160 cm) Weight: 81.6 kg (179 lb 14.3 oz) IBW/kg (Calculated) : 52.4  Temp (24hrs), Avg:99.1 F (37.3 C), Min:99 F (37.2 C), Max:99.1 F (37.3 C)  Recent Labs  Lab 12/26/20 0644 12/26/20 0828 12/26/20 1513 12/27/20 0233  WBC 5.7  --   --  5.1  CREATININE 2.35*  --   --  1.59*  LATICACIDVEN 6.6* 2.7* 2.8*  --     Estimated Creatinine Clearance: 32.4 mL/min (A) (by C-G formula based on SCr of 1.59 mg/dL (H)).    Allergies  Allergen Reactions   Ivp Dye [Iodinated Diagnostic Agents] Other (See Comments)    "hot and sweaty and almost passed out"   Aspirin Hives   Morphine And Related Itching and Rash   Sulfa Antibiotics Hives    Antimicrobials this admission: Vanc 9/21 >>  Cefepime 9/21 >>  Metronidazole 9/21 >>  Dose adjustments this admission: Increased cefepime to 2g IV Q12H given improvement in renal function  Microbiology results: 9/21 BCx: ngtd 9/21 UCx: pending  Thank you for allowing pharmacy to be a part of this patient's care.  Michela Pitcher) [place], PharmD Student

## 2020-12-27 NOTE — ED Notes (Signed)
I introduced myself to pt. Pt oriented x4. Pt keeps talking about a blister on her legs, and doesn't tell me much of anything else. Pt says she has chronic pain, but denies anything new. Denies further needs.

## 2020-12-27 NOTE — Consult Note (Signed)
[place] Nurse Consult Note: Reason for Consult:Chronic nonhealing stage 4 pressure injury to coccyx (bone palpable but not visible) with chronic osteomyelitis and large area of scarring to periwound, evidence of healing without return of melanin that now appears weepy and irritated.  Patient complains of blisters that started when she arrived here.  However, initial ED triage nurse noted open lesion to left leg on arrival. No other blisters noted.  Will implement topical orders for blistering to leg.  Noted allergy to sulfa drugs.  Wound type:chronic stage 4 coccyx wound with chronic osteomyelitis Healed epithelium to sacrum and upper buttocks with weeping and impaired skin integrity.  High risk to re-open. Patient with paraplegia and inability to move self in bed.  Left gluteal periwound with nonintact lesion consistent with medical adhesive related skin injury (MARSI) from required sacral wound care.   Pressure Injury POA: Yes Measurement: Sacrum/gluteal area impacted measures 10 cm x 8 cm with open coccyx wound 4 cm x 2.2 cm x 3 cm  Left buttock MARSI wound 1 cm x 0.3 cm x 0.1 cm  LEft leg  0.5 cm ruptured serum filled blister Wound bed: red and moist Drainage (amount, consistency, odor) minimal serosanguinous   Sacral periwound is weeping Periwound:Chronic dry skin to lower legs Dressing procedure/placement/frequency:  Cleanse sacral wound with NS and pat dry.  Fill dead space with alginate dressing (LAWsoN # [id number]) and place alginate over pink, scarred periwound that is weeping as well as left buttock MARSI wound Cover with foam dressing. Change every other day.  Left leg and any blistering:  Cleanse with NS and pat dry.  Apply silicone foam dressing. Change every other day.  Will not follow at this time.  Please re-consult if needed.  Domenic Moras MSN, RN, FNP-BC CWON Wound, Ostomy, Continence Nurse Pager [phone]

## 2020-12-27 NOTE — ED Notes (Signed)
Pt asleep. NAD noted.

## 2020-12-27 NOTE — ED Notes (Signed)
Pt said she was hot so I took off some of her covers. Denies further needs.

## 2020-12-27 NOTE — Progress Notes (Signed)
FPTS Interim Progress Note  Spoke with Dr. Gale Journey to place consult to ID given patient follows up with Dr. Tommy Medal outpatient due to her chronic osteomyelitis. D.r Vu recommends obtaining a CT abdomen/pelvis and will see her today. Imaging order placed. Appreciate expertise and recommendations of Dr. Forestine Chute, Lovette Cliche, DO 12/27/2020, 8:06 AM PGY-2, [facility] pager [phone]

## 2020-12-27 NOTE — Progress Notes (Signed)
Family Medicine Teaching Service Daily Progress Note Intern Pager: [phone]  Patient name: [name] Medical record number: [id number] Date of birth: Apr 27, 1950 Age: 70 y.o. Gender: female  Primary Care Provider: [name], MD Consultants: ID Code Status: FULL  Pt Overview and Major Events to Date:  9/21: admitted with AMS, thought to be septic  Assessment and Plan: [name] is a 70 year old female presenting with altered mental status, thought to be septic.  Past medical history is significant for cervical spine surgery with resulting paraplegia, neurogenic bladder with suprapubic catheter with recurrent UTIs, diastolic heart failure, chronic ischial osteomyelitis, type 2 diabetes, GERD, hypothyroidism, and hypertension.  Altered mental status, sepsis Patient reportedly woke up yesterday confused. On admission to the ED she was found to have a lactate of 6.6, temp of 96 (later fevered at 100.8), and she was somnolent with intermittent apnea and rigors. Possible sources could be chronic ischial osteo (just finished IV Augmentin on the 18th) or indwelling catheter (previous infection with klebsiella). She was started on broad spectrum IV ABX (vanc/cefepime/flagyl) and given 2.5L LR. Notable negatives include CTH, CXR, UDS, blood cultures with no growth at 12 hours.  UA negative for nitrites, no white blood cells.On assessment this morning patient's mental status is significantly improved: she is fully alert and oriented with intact attention. Ddx at this point includes transient bacteremia due to ischial osteo or meningitis CT A/P this morning showing soft tissue defect extending to the ischium.  -ID consult, will ask them about the utility of an LP and narrowing ABX -Consider narrowing ABX given clinical improvement -Consider MRI for concern for osteo -f/u blood cultures and urine culture -D/C neuro checks -PT/OT  Hypertension, tachycardia Patient chronically hypertensive, with  previous office visits and ED visits with BP in 170's-180's. Patient initially normotensive, however, became hypertensive after several hours. Her initial normotension may have been relative hypotension for her. Currently on home metop tartrate 100 mg BID and nifedipine 30 mg. S/p two doses of IV labetalol 20 mg. BP of 185/77 at 7 AM. 148/76 at 9 AM. mIVF have been D/C'd. Notably patient has also developed tachycardia, low concern for PE given lack of pleuritic CP and no significant SoB. Patient does not appear septic anymore, so that does not appear to be a likely explanation. -Given improvement with restarting home regimen, will: -Continue home nifedipine 30 mg -Continue home metoprolol 100 mg BID -Echo today, given concern for tachycardia  AKI Baseline Cr likely 1.0-1.2. On admission was 2.35, down to 1.59 this AM, s/p 2.5L LR. Likely pre-renal etiology given improvement with fluids. Patient has dry mucous membranes and may need more fluid since she is currently NPO. Patient is currently on vanc.  -daily BMP -mIVF NS at 125 mL/hr  Sacral decubitus ulcer, chronic ischial osteo Patient follows with Dr. Tommy Medal as an outpatient. Recently completed an outpatient 6 week IV course of augmentin for ischial osteo growing group B strep on biopsy. Pictures are in the media tab.  -wound care consult -Consider MRI as above  T2DM A1C 2 yrs ago of 10.0. On Metformin 1250 mg BID. A1C on admission of 5.7.  -Continue CBG monitoring for one day -Continue to hold home metformin -sSSI  HFpEF Home medications include lasix  but unsure if this is a current medication. Most recent echo from 2020 notable for EF 50-55% with grade 1 diastolic dysfunction and impaired diastolic relaxation. No LE pitting edema or crackles noted on exam.  -hold home lasix given AKI -  daily weights -monitor I/Os  Paraplegia  cervical stenosis Home medications include baclofen, pregabalin, Cymbalta, and percocet. Initially held  given concern for AMS.  -Restart home Cymbalta at 30 mg, reduced dose -Oxy 5 mg q6hrs for severe pain  Neurogenic bladder, recurrent UTIs Suprapubic catheter in place. Previous Klebsiella UTIs.  -Negative UA -Pending Ucx  Hypothyroidism Last TSH of 1.394 in June 2020. Home medication of levothyroxine 125 mcg daily. TSH WNL on admission labs.  -continue home levothyroxine  FEN/GI: NPO until SLP PPx: Lovenox 30 mg Dispo: pending PT/OT recs  Subjective:  On interview this morning, patient is alert and oriented. Husband is at bedside, provides details and asks questions. The patient reports stable, chronic SoB. ROS otherwise negative for chest pain, N/V.   Objective: Temp:  [96 F (35.6 C)-99 F (37.2 C)] 99 F (37.2 C) (09/22 0023) Pulse Rate:  [87-113] 108 (09/22 0600) Resp:  [0-24] 15 (09/22 0600) BP: (110-219)/(50-112) 200/93 (09/22 0600) SpO2:  [89 %-100 %] 92 % (09/22 0600) Weight:  [179 lb 14.3 oz (81.6 kg)] 179 lb 14.3 oz (81.6 kg) (09/21 0817) Physical Exam Vitals reviewed.  Cardiovascular:     Rate and Rhythm: Normal rate and regular rhythm.     Heart sounds: No murmur heard. Pulmonary:     Effort: Pulmonary effort is normal.     Breath sounds: Normal breath sounds.  Abdominal:     General: Bowel sounds are normal.     Palpations: Abdomen is soft.     Tenderness: There is no abdominal tenderness.  Neurological:     Mental Status: She is alert.     Laboratory: Recent Labs  Lab 12/26/20 0644 12/27/20 0233  WBC 5.7 5.1  HGB 12.6 11.9*  HCT 41.5 37.6  PLT 271 253   Recent Labs  Lab 12/26/20 0644 12/27/20 0233  NA 138 141  K 3.9 4.2  CL 98 105  CO2 23 26  BUN 28* 23  CREATININE 2.35* 1.59*  CALCIUM 9.1 9.1  PROT 7.4 7.0  BILITOT 0.3 0.8  ALKPHOS 53 47  ALT 21 21  AST 27 20  GLUCOSE 109* 107*     Imaging/Diagnostic Tests: CT A/P as above   Corky Sox PGY-1, Psychiatry FPTS Intern pager: [phone], text pages welcome

## 2020-12-27 NOTE — ED Notes (Signed)
MD paged regarding hypertension 

## 2020-12-27 NOTE — ED Notes (Signed)
I spoke to admitting team about pt's BP, they said they are going to put in IV BP medication for pt.

## 2020-12-27 NOTE — Progress Notes (Signed)
   12/27/20 1200  Assess: MEWS Score  Temp 98.2 F (36.8 C)  BP (!) 180/77  Pulse Rate (!) 102  ECG Heart Rate (!) 102  Resp 13  Level of Consciousness Alert  SpO2 100 %  O2 Device Room Air  Assess: MEWS Score  MEWS Temp 0  MEWS Systolic 0  MEWS Pulse 1  MEWS RR 1  MEWS LOC 0  MEWS Score 2  MEWS Score Color Yellow  Assess: if the MEWS score is Yellow or Red  Were vital signs taken at a resting state? Yes  Focused Assessment No change from prior assessment  Early Detection of Sepsis Score *See Row Information* Low  MEWS guidelines implemented *See Row Information* Yes  Treat  Pain Scale Faces  Faces Pain Scale 0  Take Vital Signs  Increase Vital Sign Frequency  Yellow: Q 2hr X 2 then Q 4hr X 2, if remains yellow, continue Q 4hrs  Escalate  MEWS: Escalate Yellow: discuss with charge nurse/RN and consider discussing with provider and RRT  Notify: Charge Nurse/RN  Name of Charge Nurse/RN Notified [place]  Date Charge Nurse/RN Notified 12/27/20  Time Charge Nurse/RN Notified 1200  Notify: Provider  Provider Name/Title Teaching service  Date Provider Notified 12/27/20  Time Provider Notified 1205  Notification Type Call  Notification Reason Other (Comment) (MEWS yellow continued from ED)  Provider response No new orders  Date of Provider Response 12/27/20  Time of Provider Response 1205  Document  Progress note created (see row info) Yes

## 2020-12-27 NOTE — ED Notes (Signed)
Report given to [place],RN

## 2020-12-27 NOTE — Progress Notes (Signed)
FPTS Brief Progress Note  S:Patient sleeping comfortably in bed. Nurse notes BP is elevated to 190   O: BP (!) 197/89 (BP Location: Left Wrist)   Pulse (!) 102   Temp 98.5 F (36.9 C) (Oral)   Resp 20   Ht 5\' 3"  (1.6 m)   Wt 81.6 kg   SpO2 98%   BMI 31.87 kg/m     A/P: - Orders reviewed. Labs for AM ordered, which was adjusted as needed.  -Will check chart and give medication to bring down BP as needed.   Holley Bouche, MD 12/27/2020, 11:58 PM PGY-1, [place] Night Resident  Please page [phone] with questions.

## 2020-12-27 NOTE — Consult Note (Signed)
[name] for Infectious Disease    Date of Admission:  12/26/2020     Reason for Consult: sepsis/ams    Referring Provider: Andria Frames   Lines:  Peripheral iv's  Abx: 9/21-c vanc 9/21-c cefepime 9/21-c metronidazole  Previous long course (6+ wk) amox-clav; finished 2 days prior to admission        Assessment: 70 yo female paraplegic, chronic suprapubic catheter, chronic sacral decub who is well known to the id service (just finished 6+ wk empiric sacral decub ulcer associated OM tx 2 days pta), admitted for acute 1 day ams   She had turned around very quickly within 24 hours of presentation, on empiric bsAbx. Bcx so far negative. Ucx 10k gnr. She has had issue with increasing discharge/redness from the sacral wound (and exam finding/ct finding consistent with this).   Her clinical course is not consistent with primary infectious syndrome of the cns  She has recent right eye cataract surgery (1 day prior to presentation); on exam doesn't appear to have endophthalmitis. Query if ams related to anesthesia some how  No fever/leukocytosis. Mild elevation lactate nonspecific  I doubt this is any infectious syndrome  Will await bcx and make final decision. If bacteremia, which is possible from translocation of the sacral ulcer, we can treat short course per identification. No plan to extend further treatment for sacral process otherwise  Plan: Continue vanc/cefepime/flagyl, although no clear evidence sepsis at this time F/u bcx If bcx negative would stop abx and have her continue f/u wound care outpatient  4.   Discussed with primary team   I spent 60 minute reviewing data/chart, and coordinating care and >50% direct face to face time providing counseling/discussing diagnostics/treatment plan with patient    ------------------------------------------------ Active Problems:   Pressure injury of skin of sacral region   Sepsis [facility])    HPI: [name]  is a 70 y.o. female paraplegic, chronic sacral decub with associated OM just finished long course treatment, recent right eye cataract 1 day pta, admitted with ams  She finished amox-clav 8 wk course 3 days prior to sx onset. No increased discharge out of sacral ulcer, redness in surrounding.   No rash No cough, chest pain, dyspnea  Has uncomplicated outpatient cataract surgery the day prior to sx onset  Husband and patient reports isolated acute onset ams. No subjective f/c On presentation has no fever/leukocytosis  Abd/pelv ct unremarkable in terms of abscess  Patient back to baseline when we saw this morning without any complaint  Family History  Problem Relation Age of Onset   Heart disease Father     Social History   Tobacco Use   Smoking status: Former    Packs/day: 0.50    Years: 50.00    Pack years: 25.00    Types: Cigarettes    Quit date: 11/06/2014    Years since quitting: 6.1   Smokeless tobacco: Never  Substance Use Topics   Alcohol use: No   Drug use: No    Allergies  Allergen Reactions   Ivp Dye [Iodinated Diagnostic Agents] Other (See Comments)    "hot and sweaty and almost passed out"   Aspirin Hives   Morphine And Related Itching and Rash   Sulfa Antibiotics Hives    Review of Systems: ROS All Other ROS was negative, except mentioned above   Past Medical History:  Diagnosis Date   Anemia    Arthritis    "all over" (04/13/2012)  Cancer of right breast [facility]) 2011   Cervical neuropathy    PERIPHERAL NEUROPATHY , HAD CERVICAL FUSION   Diabetes mellitus without complication ([place])    Difficult intubation    Fastrach #4 LMA then # 7 ETT used in 2006 [facility], cervical laminectomy)   GERD (gastroesophageal reflux disease)    Group B streptococcal infection 11/08/2020   History of kidney stones    Hypercholesteremia    Hypertension    Hypothyroidism    Kidney stones    Neurogenic bladder    Osteomyelitis (HCC)    Paraplegia (HCC)     Peripheral neuropathy    PONV (postoperative nausea and vomiting)    Renal insufficiency 12/11/2020   Sacral decubitus ulcer 10/2014   Sepsis due to urinary tract infection (HCC)        Scheduled Meds:  DULoxetine  30 mg Oral Daily   enoxaparin (LOVENOX) injection  40 mg Subcutaneous U54Y   folic acid  1 mg Oral Daily   insulin aspart  0-9 Units Subcutaneous TID WC   levothyroxine  125 mcg Oral Q0600   metoprolol tartrate  100 mg Oral BID   NIFEdipine  30 mg Oral Daily   Continuous Infusions:  sodium chloride 125 mL/hr at 12/27/20 1123   ceFEPime (MAXIPIME) IV Stopped (12/27/20 1112)   metronidazole Stopped (12/27/20 0514)   vancomycin 500 mg (12/27/20 1212)   PRN Meds:.oxyCODONE, prochlorperazine   OBJECTIVE: Blood pressure (!) 180/77, pulse (!) 102, temperature 98.2 F (36.8 C), temperature source Oral, resp. rate 13, height 5\' 3"  (1.6 m), weight 81.6 kg, SpO2 100 %.  Physical Exam General/constitutional: no distress, pleasant HEENT: Normocephalic, PER, Conj Clear, EOMI, Oropharynx clear Neck supple CV: rrr no mrg Lungs: clear to auscultation, normal respiratory effort Abd: Soft, Nontender Ext: no edema Skin: No Rash Gu: spc intact; no site insertion purulence Neuro: paraplegic MSK: reviewed pictures from sacral decub --> no purulence; small opening but deep undermining; no surrounding erythema    Lab Results Lab Results  Component Value Date   WBC 5.1 12/27/2020   HGB 11.9 (L) 12/27/2020   HCT 37.6 12/27/2020   MCV 96.2 12/27/2020   PLT 253 12/27/2020    Lab Results  Component Value Date   CREATININE 1.59 (H) 12/27/2020   BUN 23 12/27/2020   NA 141 12/27/2020   K 4.2 12/27/2020   CL 105 12/27/2020   CO2 26 12/27/2020    Lab Results  Component Value Date   ALT 21 12/27/2020   AST 20 12/27/2020   ALKPHOS 47 12/27/2020   BILITOT 0.8 12/27/2020      Microbiology: Recent Results (from the past 240 hour(s))  Urine Culture     Status: Abnormal  (Preliminary result)   Collection Time: 12/26/20  6:28 AM   Specimen: Urine, Catheterized  Result Value Ref Range Status   Specimen Description URINE, CATHETERIZED  Final   Special Requests NONE  Final   Culture (A)  Final    10,000 COLONIES/mL GRAM NEGATIVE RODS SUSCEPTIBILITIES TO FOLLOW Performed at [facility], [address], [place], [place] [address]    Report Status PENDING  Incomplete  Blood culture (routine x 2)     Status: None (Preliminary result)   Collection Time: 12/26/20  6:44 AM   Specimen: BLOOD RIGHT ARM  Result Value Ref Range Status   Specimen Description BLOOD RIGHT ARM  Final   Special Requests   Final    BOTTLES DRAWN AEROBIC AND ANAEROBIC Blood Culture  adequate volume   Culture   Final    NO GROWTH 1 DAY Performed at [facility], [place] [address], [place], [place] [address]    Report Status PENDING  Incomplete  Blood culture (routine x 2)     Status: None (Preliminary result)   Collection Time: 12/26/20  6:44 AM   Specimen: BLOOD LEFT ARM  Result Value Ref Range Status   Specimen Description BLOOD LEFT ARM  Final   Special Requests   Final    BOTTLES DRAWN AEROBIC ONLY Blood Culture adequate volume   Culture   Final    NO GROWTH 1 DAY Performed at [facility], [place] [address], [place], [place] [address]    Report Status PENDING  Incomplete  Resp Panel by RT-PCR (Flu A&B, Covid) Nasopharyngeal Swab     Status: None   Collection Time: 12/26/20  9:24 AM   Specimen: Nasopharyngeal Swab; Nasopharyngeal(NP) swabs in vial transport medium  Result Value Ref Range Status   SARS Coronavirus 2 by RT PCR NEGATIVE NEGATIVE Final    Comment: (NOTE) SARS-CoV-2 target nucleic acids are NOT DETECTED.  The SARS-CoV-2 RNA is generally detectable in upper respiratory specimens during the acute phase of infection. The lowest concentration of SARS-CoV-2 viral copies this assay can detect is 138 copies/mL. A negative result does not preclude  SARS-Cov-2 infection and should not be used as the sole basis for treatment or other patient management decisions. A negative result may occur with  improper specimen collection/handling, submission of specimen other than nasopharyngeal swab, presence of viral mutation(s) within the areas targeted by this assay, and inadequate number of viral copies(<138 copies/mL). A negative result must be combined with clinical observations, patient history, and epidemiological information. The expected result is Negative.  Fact Sheet for Patients:  EntrepreneurPulse.com.au  Fact Sheet for Healthcare Providers:  IncredibleEmployment.be  This test is no t yet approved or cleared by the [place] FDA and  has been authorized for detection and/or diagnosis of SARS-CoV-2 by FDA under an Emergency Use Authorization (EUA). This EUA will remain  in effect (meaning this test can be used) for the duration of the COVID-19 declaration under Section 564(b)(1) of the Act, 21 U.S.C.section 360bbb-3(b)(1), unless the authorization is terminated  or revoked sooner.       Influenza A by PCR NEGATIVE NEGATIVE Final   Influenza B by PCR NEGATIVE NEGATIVE Final    Comment: (NOTE) The Xpert Xpress SARS-CoV-2/FLU/RSV plus assay is intended as an aid in the diagnosis of influenza from Nasopharyngeal swab specimens and should not be used as a sole basis for treatment. Nasal washings and aspirates are unacceptable for Xpert Xpress SARS-CoV-2/FLU/RSV testing.  Fact Sheet for Patients: EntrepreneurPulse.com.au  Fact Sheet for Healthcare Providers: IncredibleEmployment.be  This test is not yet approved or cleared by the [place] FDA and has been authorized for detection and/or diagnosis of SARS-CoV-2 by FDA under an Emergency Use Authorization (EUA). This EUA will remain in effect (meaning this test can be used) for the duration of  the COVID-19 declaration under Section 564(b)(1) of the Act, 21 U.S.C. section 360bbb-3(b)(1), unless the authorization is terminated or revoked.  Performed at [facility], [place] [address], [place],  [address]     9/21 ucx 10k gnr 9/21 bcx ngtd   Serology:   Imaging: If present, new imagings (plain films, ct scans, and mri) have been personally visualized and interpreted; radiology reports have been reviewed. Decision making incorporated into the  Impression / Recommendations.  9/22 abd pelv ct 1. No acute intra-abdominal process. 2. There is a deep soft tissue defect in the left gluteal region that extends to the ischial tuberosity without associated drainable fluid collection. 3. Bilateral nonobstructive nephrolithiasis.  Jabier Mutton, [place] for Infectious Disease [place] [phone] pager    12/27/2020, 1:46 PM

## 2020-12-27 NOTE — Progress Notes (Signed)
Patient arrived to room 2W34 in NAD, VS stable and patient free from pain.

## 2020-12-28 ENCOUNTER — Inpatient Hospital Stay (HOSPITAL_COMMUNITY): Payer: Medicare Other

## 2020-12-28 ENCOUNTER — Other Ambulatory Visit (HOSPITAL_COMMUNITY): Payer: Self-pay

## 2020-12-28 DIAGNOSIS — G8220 Paraplegia, unspecified: Secondary | ICD-10-CM | POA: Diagnosis not present

## 2020-12-28 DIAGNOSIS — L89154 Pressure ulcer of sacral region, stage 4: Secondary | ICD-10-CM | POA: Diagnosis not present

## 2020-12-28 DIAGNOSIS — R4689 Other symptoms and signs involving appearance and behavior: Secondary | ICD-10-CM | POA: Diagnosis not present

## 2020-12-28 DIAGNOSIS — I472 Ventricular tachycardia: Secondary | ICD-10-CM

## 2020-12-28 LAB — COMPREHENSIVE METABOLIC PANEL
ALT: 18 U/L (ref 0–44)
AST: 22 U/L (ref 15–41)
Albumin: 3.4 g/dL — ABNORMAL LOW (ref 3.5–5.0)
Alkaline Phosphatase: 49 U/L (ref 38–126)
Anion gap: 11 (ref 5–15)
BUN: 18 mg/dL (ref 8–23)
CO2: 24 mmol/L (ref 22–32)
Calcium: 9.2 mg/dL (ref 8.9–10.3)
Chloride: 104 mmol/L (ref 98–111)
Creatinine, Ser: 1.42 mg/dL — ABNORMAL HIGH (ref 0.44–1.00)
GFR, Estimated: 39 mL/min — ABNORMAL LOW (ref >60)
Glucose, Bld: 111 mg/dL — ABNORMAL HIGH (ref 70–99)
Potassium: 3.8 mmol/L (ref 3.5–5.1)
Sodium: 139 mmol/L (ref 135–145)
Total Bilirubin: 1.1 mg/dL (ref 0.3–1.2)
Total Protein: 7.6 g/dL (ref 6.5–8.1)

## 2020-12-28 LAB — CBC
HCT: 38.2 % (ref 36.0–46.0)
Hemoglobin: 12.6 g/dL (ref 12.0–15.0)
MCH: 31 pg (ref 26.0–34.0)
MCHC: 33 g/dL (ref 30.0–36.0)
MCV: 94.1 fL (ref 80.0–100.0)
Platelets: 257 10*3/uL (ref 150–400)
RBC: 4.06 MIL/uL (ref 3.87–5.11)
RDW: 13.6 % (ref 11.5–15.5)
WBC: 7.2 10*3/uL (ref 4.0–10.5)
nRBC: 0 % (ref 0.0–0.2)

## 2020-12-28 LAB — PROCALCITONIN: Procalcitonin: 0.76 ng/mL

## 2020-12-28 LAB — URINE CULTURE: Culture: 30000 — AB

## 2020-12-28 LAB — ECHOCARDIOGRAM COMPLETE
AR max vel: 1.94 cm2
AV Area VTI: 1.92 cm2
AV Area mean vel: 1.8 cm2
AV Mean grad: 7 mmHg
AV Peak grad: 13.4 mmHg
Ao pk vel: 1.83 m/s
Height: 63 in
P 1/2 time: 379 msec
S' Lateral: 4.1 cm
Weight: 2928 oz

## 2020-12-28 LAB — GLUCOSE, CAPILLARY
Glucose-Capillary: 119 mg/dL — ABNORMAL HIGH (ref 70–99)
Glucose-Capillary: 126 mg/dL — ABNORMAL HIGH (ref 70–99)

## 2020-12-28 MED ORDER — PREGABALIN 25 MG PO CAPS
25.0000 mg | ORAL_CAPSULE | Freq: Two times a day (BID) | ORAL | Status: DC
  Administered 2020-12-28: 25 mg via ORAL
  Filled 2020-12-28: qty 1

## 2020-12-28 MED ORDER — OXYCODONE HCL 5 MG PO TABS
10.0000 mg | ORAL_TABLET | Freq: Four times a day (QID) | ORAL | Status: DC | PRN
Start: 2020-12-28 — End: 2020-12-28

## 2020-12-28 MED ORDER — DULOXETINE HCL 30 MG PO CPEP
30.0000 mg | ORAL_CAPSULE | Freq: Every day | ORAL | 1 refills | Status: DC
  Filled 2020-12-28: qty 30, 30d supply, fill #0

## 2020-12-28 MED ORDER — BACLOFEN 10 MG PO TABS
20.0000 mg | ORAL_TABLET | Freq: Four times a day (QID) | ORAL | Status: DC | PRN
  Administered 2020-12-28: 20 mg via ORAL
  Filled 2020-12-28: qty 2

## 2020-12-28 MED ORDER — PREGABALIN 25 MG PO CAPS
25.0000 mg | ORAL_CAPSULE | Freq: Two times a day (BID) | ORAL | 1 refills | Status: DC
  Filled 2020-12-28: qty 60, 30d supply, fill #0

## 2020-12-28 MED ORDER — NIFEDIPINE ER OSMOTIC RELEASE 30 MG PO TB24
30.0000 mg | ORAL_TABLET | Freq: Every day | ORAL | Status: AC
  Administered 2020-12-28: 30 mg via ORAL
  Filled 2020-12-28: qty 1

## 2020-12-28 NOTE — Plan of Care (Signed)

## 2020-12-28 NOTE — Progress Notes (Signed)
The patient was in good spirits this morning. No new concern. She endorses mild discomfort of her LL due to spasms. She is otherwise in no acute distress. She asked to go home today.  Gen: Well appearing, no distress HEENT: Lens cover/patch over her right eye. Neuro: Reduce/no sensation of both LL with weakness. Heart: Normal rhythm and rate. No murmurs. Lungs: CTA Abd: Benign Ext: No edema  A/P: AMS: Resolved Thought to be due to sepsis - Blood cx negative so far. She has been afebrile and has a normal white count. CT head negative. Appreciate's ID's recommended. I believe the ID team d/ced her antibiotic per their initial recommendation. Now leaning towards an anesthetic agent used during her Cataract surgery as the cause of her AMS. Likely d/c home today if she remains stable.  HTN: Improved after resuming home pain meds. Need close f/u with PCP for medication management.   Other chronic problems are stable.

## 2020-12-28 NOTE — Progress Notes (Signed)
AVS provided and reviewed with pt. And husband. All questions answered at this time. PT discharged home with husband via car.

## 2020-12-28 NOTE — Progress Notes (Signed)
PT Cancellation Note  Patient Details Name: [name] MRN: [id number] DOB: [birth year]   Cancelled Treatment:    Reason Eval/Treat Not Completed: PT screened, no needs identified, will sign off; pt at functional baseline (total A for transfers).  Spouse in the room and describes procedure he performs, but reports she has not been getting up due to sacral wound.  PT not indicated at this time.  Will sign off.    Reginia Naas 12/28/2020, 10:20 AM Magda Kiel, PT Acute Rehabilitation Services VAPOL:[phone] Office:[phone] 12/28/2020

## 2020-12-28 NOTE — Progress Notes (Signed)
OT Cancellation Note  Patient Details Name: [name] MRN: [id number] DOB: Mar 20, 1951   Cancelled Treatment:    Reason Eval/Treat Not Completed: OT screened, no needs identified, will sign off. Per chart review, pt is total assist with functional transfers and basic ADLs at baseline.   Hortencia Pilar 12/28/2020, 9:53 AM

## 2020-12-28 NOTE — Progress Notes (Signed)
  Echocardiogram 2D Echocardiogram has been performed.  Darlina Sicilian M 12/28/2020, 3:30 PM

## 2020-12-28 NOTE — Progress Notes (Addendum)
[facility] for Infectious Disease  Date of Admission:  12/26/2020     Lines:  Peripheral iv's   Abx: 9/21-c vanc 9/21-c cefepime 9/21-c metronidazole   Previous long course (6+ wk) amox-clav; finished 2 days prior to admission                                                                Assessment: 70 yo female paraplegic, chronic suprapubic catheter, chronic sacral decub who is well known to the id service (just finished 6+ wk empiric sacral decub ulcer associated OM tx 2 days pta), admitted for acute 1 day ams    She had turned around very quickly within 24 hours of presentation, on empiric bsAbx. Bcx so far negative. Ucx 10k gnr. She has had issue with increasing discharge/redness from the sacral wound (and exam finding/ct finding consistent with this).    Her clinical course is not consistent with primary infectious syndrome of the cns   She has recent right eye cataract surgery (1 day prior to presentation); on exam doesn't appear to have endophthalmitis. Query if ams related to anesthesia some how   No fever/leukocytosis. Mild elevation lactate nonspecific   I doubt this is any infectious syndrome   Will await bcx and make final decision. If bacteremia, which is possible from translocation of the sacral ulcer, we can treat short course per identification. No plan to extend further treatment for sacral process otherwise  ---------- 9/23 assessment Felt well/baseline No fever or other evidence sepsis Bcx remains negative  Again suspect anesthesia related confusion.    Plan: Stop all abx Ok to dc from id standpoint F/u pcp next week Will follow blood cx and call patient if concerning results 5.   Discussed with primary team  I spent more than 35 minute reviewing data/chart, and coordinating care and >50% direct face to face time providing counseling/discussing diagnostics/treatment plan with patient    Active Problems:   Pressure injury of skin  of sacral region   Sepsis ([place])   Altered behavior   Allergies  Allergen Reactions   Ivp Dye [Iodinated Diagnostic Agents] Other (See Comments)    "hot and sweaty and almost passed out"   Aspirin Hives   Morphine And Related Itching and Rash   Sulfa Antibiotics Hives    Scheduled Meds:  DULoxetine  30 mg Oral Daily   enoxaparin (LOVENOX) injection  40 mg Subcutaneous U76L   folic acid  1 mg Oral Daily   insulin aspart  0-9 Units Subcutaneous TID WC   levothyroxine  125 mcg Oral Q0600   metoprolol tartrate  100 mg Oral BID   NIFEdipine  30 mg Oral Daily   pregabalin  25 mg Oral BID   Continuous Infusions: PRN Meds:.baclofen, oxyCODONE, prochlorperazine   SUBJECTIVE: Felt well No complaint No fever Bcx ngtd   Review of Systems: ROS All other ROS was negative, except mentioned above     OBJECTIVE: Vitals:   12/28/20 0726 12/28/20 0847 12/28/20 0849 12/28/20 1111  BP: (!) 174/148 (!) 174/82  (!) 155/71  Pulse: 96 (!) 104  97  Resp: 20   16  Temp: 98.4 F (36.9 C)   98.2 F (36.8 C)  TempSrc: Oral  Oral  SpO2: 96%   100%  Weight:   83 kg   Height:       Body mass index is 32.42 kg/m.  Physical Exam General/constitutional: no distress, pleasant HEENT: Normocephalic, PER, Conj Clear, EOMI, Oropharynx clear Neck supple CV: rrr no mrg Lungs: clear to auscultation, normal respiratory effort Abd: Soft, Nontender Ext: no edema Skin: No Rash GU: didn't examine sacral ulcer today -- no new issue per patient Neuro: nonfocal; old paraplegia MSK: no peripheral joint swelling/tenderness/warmth; back spines nontender   Lab Results Lab Results  Component Value Date   WBC 7.2 12/28/2020   HGB 12.6 12/28/2020   HCT 38.2 12/28/2020   MCV 94.1 12/28/2020   PLT 257 12/28/2020    Lab Results  Component Value Date   CREATININE 1.42 (H) 12/28/2020   BUN 18 12/28/2020   NA 139 12/28/2020   K 3.8 12/28/2020   CL 104 12/28/2020   CO2 24 12/28/2020    Lab  Results  Component Value Date   ALT 18 12/28/2020   AST 22 12/28/2020   ALKPHOS 49 12/28/2020   BILITOT 1.1 12/28/2020      Microbiology: Recent Results (from the past 240 hour(s))  Urine Culture     Status: Abnormal   Collection Time: 12/26/20  6:28 AM   Specimen: Urine, Catheterized  Result Value Ref Range Status   Specimen Description URINE, CATHETERIZED  Final   Special Requests   Final    NONE Performed at [facility], [address], [place], [place] [address]    Culture 30,000 COLONIES/mL PSEUDOMONAS AERUGINOSA (A)  Final   Report Status 12/28/2020 FINAL  Final   Organism ID, Bacteria PSEUDOMONAS AERUGINOSA (A)  Final      Susceptibility   Pseudomonas aeruginosa - MIC*    CEFTAZIDIME 2 SENSITIVE Sensitive     CIPROFLOXACIN 2 INTERMEDIATE Intermediate     GENTAMICIN 4 SENSITIVE Sensitive     IMIPENEM >=16 RESISTANT Resistant     PIP/TAZO 8 SENSITIVE Sensitive     * 30,000 COLONIES/mL PSEUDOMONAS AERUGINOSA  Blood culture (routine x 2)     Status: None (Preliminary result)   Collection Time: 12/26/20  6:44 AM   Specimen: BLOOD RIGHT ARM  Result Value Ref Range Status   Specimen Description BLOOD RIGHT ARM  Final   Special Requests   Final    BOTTLES DRAWN AEROBIC AND ANAEROBIC Blood Culture adequate volume   Culture   Final    NO GROWTH 1 DAY Performed at [facility], [address] [place], [place] [address]    Report Status PENDING  Incomplete  Blood culture (routine x 2)     Status: None (Preliminary result)   Collection Time: 12/26/20  6:44 AM   Specimen: BLOOD LEFT ARM  Result Value Ref Range Status   Specimen Description BLOOD LEFT ARM  Final   Special Requests   Final    BOTTLES DRAWN AEROBIC ONLY Blood Culture adequate volume   Culture   Final    NO GROWTH 1 DAY Performed at [facility], [place] [address] [place], [place] [address]    Report Status PENDING  Incomplete  Resp Panel by RT-PCR (Flu A&B, Covid) Nasopharyngeal Swab      Status: None   Collection Time: 12/26/20  9:24 AM   Specimen: Nasopharyngeal Swab; Nasopharyngeal(NP) swabs in vial transport medium  Result Value Ref Range Status   SARS Coronavirus 2 by RT PCR NEGATIVE NEGATIVE Final  Comment: (NOTE) SARS-CoV-2 target nucleic acids are NOT DETECTED.  The SARS-CoV-2 RNA is generally detectable in upper respiratory specimens during the acute phase of infection. The lowest concentration of SARS-CoV-2 viral copies this assay can detect is 138 copies/mL. A negative result does not preclude SARS-Cov-2 infection and should not be used as the sole basis for treatment or other patient management decisions. A negative result may occur with  improper specimen collection/handling, submission of specimen other than nasopharyngeal swab, presence of viral mutation(s) within the areas targeted by this assay, and inadequate number of viral copies(<138 copies/mL). A negative result must be combined with clinical observations, patient history, and epidemiological information. The expected result is Negative.  Fact Sheet for Patients:  EntrepreneurPulse.com.au  Fact Sheet for Healthcare Providers:  IncredibleEmployment.be  This test is no t yet approved or cleared by the [place] FDA and  has been authorized for detection and/or diagnosis of SARS-CoV-2 by FDA under an Emergency Use Authorization (EUA). This EUA will remain  in effect (meaning this test can be used) for the duration of the COVID-19 declaration under Section 564(b)(1) of the Act, 21 U.S.C.section 360bbb-3(b)(1), unless the authorization is terminated  or revoked sooner.       Influenza A by PCR NEGATIVE NEGATIVE Final   Influenza B by PCR NEGATIVE NEGATIVE Final    Comment: (NOTE) The Xpert Xpress SARS-CoV-2/FLU/RSV plus assay is intended as an aid in the diagnosis of influenza from Nasopharyngeal swab specimens and should not be used as a sole basis  for treatment. Nasal washings and aspirates are unacceptable for Xpert Xpress SARS-CoV-2/FLU/RSV testing.  Fact Sheet for Patients: EntrepreneurPulse.com.au  Fact Sheet for Healthcare Providers: IncredibleEmployment.be  This test is not yet approved or cleared by the [place] FDA and has been authorized for detection and/or diagnosis of SARS-CoV-2 by FDA under an Emergency Use Authorization (EUA). This EUA will remain in effect (meaning this test can be used) for the duration of the COVID-19 declaration under Section 564(b)(1) of the Act, 21 U.S.C. section 360bbb-3(b)(1), unless the authorization is terminated or revoked.  Performed at [facility], [place] [address] [place], [place] [address]      Serology:   Imaging: If present, new imagings (plain films, ct scans, and mri) have been personally visualized and interpreted; radiology reports have been reviewed. Decision making incorporated into the Impression / Recommendations.  9/22 abd pelv ct 1. No acute intra-abdominal process. 2. There is a deep soft tissue defect in the left gluteal region that extends to the ischial tuberosity without associated drainable fluid collection. 3. Bilateral nonobstructive nephrolithiasis.  Jabier Mutton, [place] for Infectious Disease [place] [phone] pager    12/28/2020, 1:10 PM

## 2020-12-28 NOTE — Discharge Instructions (Addendum)
You were hospitalized at [facility] due to confusion and an infection.  We expect this is from an infection which we believe is from your wound which improved after antibiotic treatment and IV fluids.  We are so glad you are feeling better.  Be sure to follow-up with your regularly scheduled appointments.  Please also be sure to follow-up with your primary care doctor at your earliest convenience. Please also take all medications as prescribed. Thank you for allowing [place] to be a part of your medical care.  Take care, [facility] family medicine team

## 2020-12-28 NOTE — Discharge Summary (Addendum)
[facility] Discharge Summary  Patient name: [name] Medical record number: [id number] Date of birth: 20-Nov-1950 Age: 70 y.o. Gender: female Date of Admission: 12/26/2020  Date of Discharge: 12/28/20 Admitting Physician: Donney Dice, DO  Primary Care Provider: [name], MD Consultants: Infectious Disease   Indication for Hospitalization: Delirium, Sepsis  Discharge Diagnoses/Problem List:  Delirium  Sepsis  Pressure Injury  HTN AKI  T2DM HFpEF Neurogenic bladder  Disposition: Home   Discharge Condition: Stable  Discharge Exam:  Gen: AOx4, in NAD HEENT: NCAT, no LAD CV: RRR, no mgr, radial pulses, brahacial pulses, dorsalis pedis and posterior tibialis pulses, no peripheral edema Lung: CTAB, normal WOB Abd: NT/ND, +BS Neuro: CN II-XII grossly intact, visible spasms bilaterally in her legs Skin: normal tone, does have dry patches bilaterally on legs, dry blistered skin on left fingers  Brief Hospital Course:  [name] is a 70 year old female presenting on 9/21 with altered mental status, thought to be septic and was admitted to [facility]. Past medical history is significant for cervical spine surgery with resulting paraplegia, neurogenic bladder with suprapubic catheter with recurrent UTIs, diastolic heart failure, chronic ischial osteomyelitis, type 2 diabetes, GERD, hypothyroidism, hypertension, and right sided breast cancer.   Delirium concern for sepsis  Mrs. Giangregorio reportedly woke up 9/21 with AMS. Patient had right eye cataract surgery on 9/20 and family was concerned medications related to procedure could be contributing to patient's presentation. On admission to the ED she was found to have a lactate of 6.6, temp of 96 (later fevered at 100.8), and she was somnolent with intermittent apnea and rigors. Notable negatives include CTH, CXR, UDS, urine culture 10k gnr and 30k pseudomonas, blood cultures  with no growth at 24 hours. UA negative for nitrites, no white blood cells. CT A/P showed soft tissue defect extending to the ischium. Possible sources of infection considered were chronic ischial osteo (just finished IV Augmentin on the 18th) or indwelling catheter (previous infection with klebsiella). She was started on broad spectrum IV ABX (vanc/cefepime/flagyl) on 9/21 and given 2.5L LR. Patient's mental status is significantly improved: she is fully alert and oriented with intact attention stating the days of the week backwards and answering ICU-CAM thought organization questions correctly. Less likely infectious syndrome per ID and less likely bacteremia, via translocation from the sacral ulcer, given blood cultures with no growth at 24 hours. Given quick improvement in AMS, potential etiology of delirium could be related to anesthesia from recent right eye cataract surgery. Patient's husband has been closely involved in the care of the patient and agrees she has returned to baseline. Blood culture negative, discontinued vanc/cefepime/flagyl per ID. Patient cleared by speech pathology for regular diet. PT/OT saw the patient and had no further recommendations.     Hypertension, tachycardia Patient initially normotensive, however, became hypertensive after several hours in the emergency room. Currently on home metoprolol tartrate 100 mg BID and nifedipine 30 mg. S/p two doses of IV labetalol 20 mg. BP of 185/77 at 7 AM. 148/76 at 9 AM. Notably patient has also developed tachycardia, low concern for PE given lack of pleuritic CP and no significant SoB. Patient does not appear septic anymore, so that does not appear to be a likely explanation. EKG on 9/22 demonstrates sinus tachycardia, with nonspecific ST and T wave changes, and left ventricular hypertrophy. Hypertensive over the night of 9/23 SBP (174-203) and DBP (89-148) with tachycardia to 104, given additional Nifedipine 30 mg. Hypertension could  be due  to decrease in home pain medication regimen which was held on admission and restarted at a lower dose due to concerns for AMS, sedation, and respiratory depression. Will restart home pain management regimen today. Continue home Nifedipine 30 mg and Metoprolol 100 mg BID.    AKI On admission was 2.35, down to 1.42 this AM, s/p 2.5L LR. Baseline Cr likely 1.0-1.2. Likely pre-renal etiology given improvement with fluids.    Paraplegia  cervical stenosis Home medications include Baclofen 20 mg q6h PRN, Pregabalin 75 mg BID daily, Cymbalta 60 mg daily, and Oxycodone. Initially held given concern for AMS Patient has muscle spasms bilaterally in lower extremities throughout the day and night, worse in the evening. Spasms present on physical examination. Start Oxycodone 10 mg PRN q6hrs for severe pain, Pregabalin 25 mg once BID, Baclofen 20 mg q6h PRN for muscle spasms, Cymbalta at 30 mg, reduced dose.    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 Patient follows with Dr. Tommy Medal as an outpatient. Recently completed an outpatient 6 week IV course of Augmentin for ischial osteo growing group B strep on biopsy. Pictures are in the media tab. Was seen by wound care and recommendations for outpatient care. Patient instructed to continue to follow up with ID outpatient as appropriate.    T2DM Hemoglobin A1C 2 yrs ago of 10.0. On Metformin 1250 mg BID. A1C on admission of 5.7. CBGs ranged from 102-128. Restart Metformin on discharge.    HFpEF Home medications include lasix, which the patient says she has been taking. Most recent echo from 2020 notable for EF 50-55% with grade 1 diastolic dysfunction and impaired diastolic relaxation. No LE pitting edema or crackles noted on exam. Recommended holding Lasix. Echo is scheduled.    Neurogenic bladder, recurrent UTIs Suprapubic catheter in place. Previous Klebsiella UTIs. Urine culture 10k  gnr and 30k pseudomonas, this was noted to be insignificant given patient's symptoms resolved so no antibiotics or further intervention started.    Hypothyroidism Last TSH of 1.394 in June 2020. Home medication of levothyroxine 125 mcg daily. TSH WNL on admission labs. Continue home levothyroxine upon discharge.   Issues to Follow Up:  Continue to monitor BP and follow up with PCP Dr. [name] as soon as possible. May need to increase Nifedipine to 60 mg daily for better blood pressure control.  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 Appointment scheduled with Dr. Linton Ham on 01/08/2021.  Continue outpatient care with infectious disease. Next appointment scheduled with Dr. [name] for 02/13/21.   Significant Procedures: NA  Significant Labs and Imaging:  Recent Labs  Lab 12/26/20 0644 12/27/20 0233 12/28/20 0248  WBC 5.7 5.1 7.2  HGB 12.6 11.9* 12.6  HCT 41.5 37.6 38.2  PLT 271 253 257   Recent Labs  Lab 12/26/20 0644 12/27/20 0233 12/28/20 0248  NA 138 141 139  K 3.9 4.2 3.8  CL 98 105 104  CO2 23 26 24   GLUCOSE 109* 107* 111*  BUN 28* 23 18  CREATININE 2.35* 1.59* 1.42*  CALCIUM 9.1 9.1 9.2  ALKPHOS 53 47 49  AST 27 20 22   ALT 21 21 18   ALBUMIN 3.6 3.3* 3.4*    Results/Tests Pending at Time of Discharge:  Nasal MRSA PCR  Echo   Discharge Medications:  Allergies as of 12/28/2020       Reactions   Ivp Dye [  iodinated Diagnostic Agents] Other (See Comments)   "hot and sweaty and almost passed out"   Aspirin Hives   Morphine And Related Itching, Rash   Sulfa Antibiotics Hives        Medication List     STOP taking these medications    amoxicillin-clavulanate 875-125 MG tablet Commonly known as: Augmentin   furosemide 40 MG tablet Commonly known as: LASIX   mometasone-formoterol 100-5 MCG/ACT Aero Commonly known as: DULERA       TAKE these medications    baclofen 20 MG  tablet Commonly known as: LIORESAL Take 20 mg by mouth every 6 (six) hours as needed for muscle spasms.   bisacodyl 10 MG suppository Commonly known as: DULCOLAX Place 1 suppository (10 mg total) rectally daily as needed for severe constipation.   BromSite 0.075 % Soln Generic drug: Bromfenac Sodium Place 1 drop into the right eye daily.   [employer] Senna 8.6 MG tablet Generic drug: senna Take 2 tablets by mouth daily as needed for constipation.   docusate sodium 100 MG capsule Commonly known as: COLACE Take 100 mg by mouth 2 (two) times daily as needed (FOR CONSTIPATION).   DULoxetine 30 MG capsule Commonly known as: CYMBALTA Take 1 capsule (30 mg total) by mouth daily. Start taking on: December 29, 2020 What changed:  medication strength how much to take   ferrous sulfate 325 (65 FE) MG tablet Take 325 mg by mouth daily after supper.   folic acid 1 MG tablet Commonly known as: FOLVITE Take 1 mg by mouth daily.   levothyroxine 125 MCG tablet Commonly known as: SYNTHROID Take 1 tablet (125 mcg total) by mouth daily before breakfast. What changed: when to take this   metFORMIN 1000 MG tablet Commonly known as: GLUCOPHAGE Take 1,000 mg by mouth 2 (two) times daily.   metoprolol tartrate 100 MG tablet Commonly known as: LOPRESSOR Take 100 mg by mouth 2 (two) times daily.   multivitamin capsule Take 1 capsule by mouth daily.   NIFEdipine 30 MG 24 hr tablet Commonly known as: PROCARDIA-XL/NIFEDICAL-XL Take 30 mg by mouth daily after supper.   oxyCODONE-acetaminophen 10-325 MG tablet Commonly known as: PERCOCET Take 1 tablet by mouth every 6 (six) hours as needed for pain.   polyethylene glycol 17 g packet Commonly known as: MIRALAX / GLYCOLAX Take 17 g by mouth 2 (two) times daily. What changed:  when to take this reasons to take this   pregabalin 25 MG capsule Commonly known as: LYRICA Take 1 capsule (25 mg total) by mouth 2 (two) times daily. What changed:   medication strength how much to take   vitamin C 1000 MG tablet Take 1,000 mg by mouth daily.   Zinc Sulfate 220 (50 Zn) MG Tabs Take 50 mg by mouth daily.        Discharge Instructions: Please refer to Patient Instructions section of EMR for full details.  Patient was counseled important signs and symptoms that should prompt return to medical care, changes in medications, dietary instructions, activity restrictions, and follow up appointments.   Follow-Up Appointments:  Follow-up Information     [name], MD. Schedule an appointment as soon as possible for a visit.   Specialty: Internal Medicine Why: Please make an appointment to be seen at your earliest convenience for a hospital follow up. Contact information: [place] [address] [phone]         Leonie Man, MD .   Specialty: Cardiology Contact  information: [place] [address] [phone]         [facility]             . Go on 01/08/2021.   Why: Please go to your appointment with Dr. Dellia Nims at [place] information: [place] [place] [phone] [place], [place], Medical Student 12/28/2020, 2:43 PM [facility]   I was personally present and performed or re-performed the history, physical exam and medical decision making activities of this service and have verified that the service and findings are accurately documented in the student's note. My edits are noted within the note. Please also see any attending's notes and attestation.   Donney Dice, DO                  12/28/2020, 7:44 PM  PGY-2, [place]

## 2020-12-28 NOTE — Care Management Important Message (Signed)
Important Message  Patient Details  Name: [name] MRN: [id number] Date of Birth: 07-24-1950   Medicare Important Message Given:  Yes     Orbie Pyo 12/28/2020, 2:53 PM

## 2020-12-28 NOTE — Plan of Care (Signed)
  Problem: Education: Goal: Knowledge of General Education information will improve Description: Including pain rating scale, medication(s)/side effects and non-pharmacologic comfort measures 12/28/2020 1607 by Chaney Born, RN Outcome: Adequate for Discharge 12/28/2020 1436 by Chaney Born, RN Outcome: Progressing   Problem: Health Behavior/Discharge Planning: Goal: Ability to manage health-related needs will improve 12/28/2020 1607 by Chaney Born, RN Outcome: Adequate for Discharge 12/28/2020 1436 by Chaney Born, RN Outcome: Progressing   Problem: Clinical Measurements: Goal: Ability to maintain clinical measurements within normal limits will improve 12/28/2020 1607 by Chaney Born, RN Outcome: Adequate for Discharge 12/28/2020 1436 by Chaney Born, RN Outcome: Progressing Goal: Will remain free from infection 12/28/2020 1607 by Chaney Born, RN Outcome: Adequate for Discharge 12/28/2020 1436 by Chaney Born, RN Outcome: Progressing Goal: Diagnostic test results will improve 12/28/2020 1607 by Chaney Born, RN Outcome: Adequate for Discharge 12/28/2020 1436 by Chaney Born, RN Outcome: Progressing Goal: Respiratory complications will improve 12/28/2020 1607 by Chaney Born, RN Outcome: Adequate for Discharge 12/28/2020 1436 by Chaney Born, RN Outcome: Progressing Goal: Cardiovascular complication will be avoided 12/28/2020 1607 by Chaney Born, RN Outcome: Adequate for Discharge 12/28/2020 1436 by Chaney Born, RN Outcome: Progressing   Problem: Activity: Goal: Risk for activity intolerance will decrease 12/28/2020 1607 by Chaney Born, RN Outcome: Adequate for Discharge 12/28/2020 1436 by Chaney Born, RN Outcome: Progressing   Problem: Nutrition: Goal: Adequate nutrition will be maintained 12/28/2020 1607 by Chaney Born, RN Outcome: Adequate for Discharge 12/28/2020 1436 by Chaney Born, RN Outcome: Progressing   Problem: Coping: Goal: Level of anxiety  will decrease 12/28/2020 1607 by Chaney Born, RN Outcome: Adequate for Discharge 12/28/2020 1436 by Chaney Born, RN Outcome: Progressing   Problem: Elimination: Goal: Will not experience complications related to bowel motility 12/28/2020 1607 by Chaney Born, RN Outcome: Adequate for Discharge 12/28/2020 1436 by Chaney Born, RN Outcome: Progressing Goal: Will not experience complications related to urinary retention 12/28/2020 1607 by Chaney Born, RN Outcome: Adequate for Discharge 12/28/2020 1436 by Chaney Born, RN Outcome: Progressing   Problem: Pain Managment: Goal: General experience of comfort will improve 12/28/2020 1607 by Chaney Born, RN Outcome: Adequate for Discharge 12/28/2020 1436 by Chaney Born, RN Outcome: Progressing   Problem: Safety: Goal: Ability to remain free from injury will improve 12/28/2020 1607 by Chaney Born, RN Outcome: Adequate for Discharge 12/28/2020 1436 by Chaney Born, RN Outcome: Progressing   Problem: Skin Integrity: Goal: Risk for impaired skin integrity will decrease 12/28/2020 1607 by Chaney Born, RN Outcome: Adequate for Discharge 12/28/2020 1436 by Chaney Born, RN Outcome: Progressing

## 2020-12-31 LAB — CULTURE, BLOOD (ROUTINE X 2)
Culture: NO GROWTH
Culture: NO GROWTH
Special Requests: ADEQUATE
Special Requests: ADEQUATE

## 2021-01-08 ENCOUNTER — Encounter (HOSPITAL_BASED_OUTPATIENT_CLINIC_OR_DEPARTMENT_OTHER): Payer: Medicare Other | Attending: Internal Medicine | Admitting: Internal Medicine

## 2021-01-08 ENCOUNTER — Other Ambulatory Visit: Payer: Self-pay

## 2021-01-08 DIAGNOSIS — G894 Chronic pain syndrome: Secondary | ICD-10-CM | POA: Diagnosis not present

## 2021-01-08 DIAGNOSIS — E11622 Type 2 diabetes mellitus with other skin ulcer: Secondary | ICD-10-CM | POA: Diagnosis not present

## 2021-01-08 DIAGNOSIS — E039 Hypothyroidism, unspecified: Secondary | ICD-10-CM | POA: Insufficient documentation

## 2021-01-08 DIAGNOSIS — G8220 Paraplegia, unspecified: Secondary | ICD-10-CM | POA: Insufficient documentation

## 2021-01-08 DIAGNOSIS — M8668 Other chronic osteomyelitis, other site: Secondary | ICD-10-CM | POA: Diagnosis not present

## 2021-01-08 DIAGNOSIS — L89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, stage 4: Secondary | ICD-10-CM | POA: Diagnosis not present

## 2021-01-08 DIAGNOSIS — I10 Essential (primary) hypertension: Secondary | ICD-10-CM | POA: Diagnosis not present

## 2021-01-09 NOTE — Progress Notes (Signed)
[name], [name] ([id number]) Visit Report for 01/08/2021 HPI Details Patient Name: Date of Service: [name], [name]. 01/08/2021 10:00 [place] Record Number: [id number] Patient Account Number: [id number] Date of Birth/Sex: Treating RN: October 28, 1950 (70 y.o. [name], [name] Primary Care Provider: [name] Other Clinician: Referring Provider: Treating Provider/Extender: Tommy Rainwater in Treatment: 105 History of Present Illness Location: open ulceration of the left gluteal area, left heel and right ankle for about 5 months. Quality: Patient reports No Pain. Severity: Patient states wound(s) are getting worse. Duration: Patient has had the wound for > 5 months prior to seeking treatment at the wound center Context: The wound occurred when the patient has been paraplegic for about 3 years. Modifying Factors: Wound improving due to current treatment. ssociated Signs and Symptoms: Patient reports having foul odor. A HPI Description: this 70 year old patient who is known to have hypertension, hypothyroidism, breast cancer, chronic pain syndrome, paraplegia was noted to have a left gluteal decubitus ulcer and was brought into the hospital. During the course of her hospitalization she was debrided in the operating room by ankle wound. Bone cultures were taken at that time but were negative but clinically she was treated for osteomyelitis because of the probing down to bone and open exposed bone. Home health has been giving her antibioticss which include vancomycin and Zosyn. The patient was a smoker until about 3 weeks ago and used to smoke about 10 cigarettes a day for a long while. 12/13/2014 - details of her operative note from 11/03/2014 were reviewed -- PROCEDURE: 1. Excisional debridement skin, subcutaneous, muscle left ischium 35 cm2 2. Excisional debridement skin, subcutaneous tissue left heel 27 cm2 3. Excisional debridement right ankle skin, subcutaneous, bone  30 cm2 01/24/2015 -- she has some issues with her wheelchair cushion but other than that is doing very well and has received Podus boots for her feet. 02/14/2015 -- she was using her old offloading boots and this seemed to have caused her a new pressure ulcer on the left posterior heel near the superior part just below the Achilles tendon. 03/07/2015 -- she has a new ulceration just to the left of the midline on her sacral region more on the left buttock and this has been there for Dr. Leland Johns and had all the wounds sharply debrided. The debridement was done for the left ischial wound, the left heel wound and the right about a week. 08/22/2015 -- was recently admitted to hospital between May 5 and 08/13/2015, with sepsis and leukocytosis due to a UTI. she was treated for a sepsis complicating Escherichia coli UTI and kidney stones. She also had metabolic and careful up at the secondary to pyelonephritis. He received broad-spectrum antibiotics initially and then received Macrobid as per urology. She was sent home on nitrofurantoin. during her admission she had a CT scan which showed exposed left ischial tuberosity without evidence of osteolysis. 09/12/2015-- the patient is having some issues with her air mattress and would like to get a opinion from medical modalities. 10/10/2015 -- the issue with her air mattress has not yet been sorted out and the new problem seems to be a lot of odor from the wound VAC. 11/27/2015 -- the patient was admitted to the hospital between July 23 and 10/31/2015. Her problems were sepsis, osteomyelitis of the pelvic bone and acute pyelonephritis. CT of the abdomen and pelvis was consistent with a left-sided pyelonephritis with hydronephrosis and also just showed new sclerosis of the posterior portion of  the left anterior pubic ramus suggestive of periosteal reaction consistent with osteomyelitis. She was treated for the osteomyelitis with infectious disease consult  recommending 6 weeks of IV antibiotics including vancomycin and Rocephin and the antibiotics were to go on until 12/10/2015. He was seen by Dr. Iran Planas plastic surgery and Dr. Linus Salmons of infectious disease. She had a suprapubic catheter placed during the admission. CT scan done on 10/28/2015 showed specifically -- New sclerosis of the posterior portion of the left inferior pubic ramus with aggressive periosteal reaction, consistent with osteomyelitis, with adjacent soft tissue gas compatible with previously described decubitus ulcer. 12/12/2015 -- she was recently seen by Dr. Linus Salmons, who noted good improvement and CRP and ESR compared to before and he has stopped her antibiotic as per plans to finish on September 4. The patient was encouraged to continue with wound care and consider hyperbaric oxygen therapy. Today she tells me that she has consented to undergo hyperbaric oxygen therapy and we can start the paperwork. 01/02/2016 -- her PCP had gained about 3 years but she still persists in having problems during hyperbaric oxygen therapy with some discomfort in the ears. 01/09/16; pressure area with underlying osteomyelitis in the left buttock. Wound bed itself has some slight amount of grayish surface slough however I do not think any debridement was necessary. There is no exposed bone soft tissue appears stable. She is using a wound VAC 01/16/16; back for weekly wound review in conjunction with HBO. She has a deep wound over the left initial tuberosity previously treated with 6 weeks of IV antibiotics for osteomyelitis. Wound bed looks reasonably healthy although the base of this is still precariously close to bone. She has been using a wound VAC. 01/23/2016 -- she has completed her course of antibiotics and this week the only new thing is her right great toe nail was avulsed and she has got an open wound over the nailbed. 01/31/16 she has completed her course of antibiotics. Her right great toenail  avulsed last week and she's been using silver alginate for this as well. 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 Did not sound like there was any optimistic discussion regarding reconstruction and the net result would be to continue with the wound VAC application. I will await the official reports. Addendum: -- she was seen at [facility] surgery service by Dr. Tressa Busman. After a thorough review and from what I understand spending 45 minutes with the patient his assessment has been noted by me in detail and the management options were: 1. Continued pressure offloading and wound care versus operative procedures including wound excision 2. Soft tissue and bone sampling 3. If the wound gets larger wound closure would be done using a variety of plastic surgical techniques including but not limited to skin substitute, possible skin graft, local versus regional flaps, negative pressure dressing application. 4. He discussed with her details of flap surgery and the risks associated 5. He made a comment that since the patient was operated on by Dr. Leland Johns of [facility] plastic surgery unit in [place] the patient may continue to follow-up there for further evaluation for surgical flap closure in the future. 03/19/2016 -- the patient continues to be rather depressed and frustrated  with her lack of rapid progress in healing this wound especially because she thought after hyperbaric oxygen therapy the wound would heal extremely fast. She now understands that was not the implied benefit on wound care which was the recommendation for hyperbaric oxygen therapy. I have had a lengthy discussion with the patient and her husband regarding her  options: 1. Continue with collagen and wound VAC for the primary dressing and offloading and all supportive care. 2. See Dr. Iran Planas for possible placement of Acell or Integra in the OR. 3. get a second opinion from a wound care center and surrounding regions/counties 05/07/2016 -- Note from Dr. Celedonio Miyamoto, who noted that the patient has declined flap surgery. She has discussed application of A cell, and try a few applications to see how the wound progresses. She is also recommended that we could apply products here in the wound center, like Oasis. during her preop workup it was found that her hemoglobin A1c was 11% and she has now been diagnosed as having diabetes mellitus and has been put on appropriate treatment by her PCP 05/28/2016 -- tells me her blood sugars have been doing well and she has an appointment to see her PCP in the next couple of weeks to check her hemoglobin A1c. Other than that she continues to do well. 06/25/2016 -- have not seen her back for the last month but she says her health has been about the same and she has an appointment to check the A1c next week 09/10/16 ---- was seen by Dr. Celedonio Miyamoto -- who applied Acell and saw her back in follow-up. She has recommended silver alginate to the wound every other day and cover with foam. If no significant drainage could transition to collagen every other day. She recommended discontinuing wound VAC. There were no plans to repeat application of Acell. The patient expressed that her husband could do the wound care as going to the Wound Ctr., would cost several $100 for each visit. 10/21/2016 -- her insurance company is getting her new mattress and she is pleased about that. Other than that she has been doing dressings with PolyMem Silver and has been doing very well 02/18/2017 -- she has gone through several changes of her mattress and has not been pleased with any of them. The ventricles are still working on trying to  fit her with the appropriate low air-loss mattress. She has a new wound on the gluteal area which is clearly separated from the original wound. 03/25/17-she is here in follow-up evaluation for her left ischialpressure ulcer. She remains unsatisfied with her pressure mattress. She admits to sitting multiple hours a day, in the bed. We have discussed offloading options. The wound does not appear infected. Nutrition does not appear to be a concern. Will follow-up in 4 weeks, if wound continues to be stalled may consider x-ray to evaluate for refractory osteomyelitis. 04/21/17; this is a patient that I don't know all that well. She has a chronic wound which at one point had underlying osteomyelitis in the left ischial tuberosity. This is a stage IV pressure ulcer. Over the last 3 months she has a stage II wound inferiorly to the original wound. The last time she was here her dressing was changed to silver collagen although the patient's husband who changes the dressing said that the collagen stuck to the wound and remove skin from the superficial area therefore he switched back to [place] 05/13/17; this is a patient we've been following for a left  ischial tuberosity wound which was stage IV at one point had underlying osteomyelitis. Over the last several months she's had a stage II wound just inferior and medial to the related to the wound. According to her husband he is using Endoform layer with collagen although this is not what I had last time. According to her husband they are using Elgie Congo with collagen although I don't quite know how that started. She was hospitalized from 1/20 through 04/30/16. This was related to a UTI. Her blood cultures were negative, urine culture showed multiple species. She did have a CT scan of the abdomen and pelvis which documented chronic osteomyelitis in the area of the wound inflammatory markers were unremarkable. She has had prior knowledge of osteomyelitis. It  looks as though she received IV antibiotics in 2017 and was treated with a course of hyperbaric oxygen. 05/28/17; the wound over the left ischial tuberosity is deeper today and abuts clearly on bone. Nursing intake reported drainage. I therefore culture of the wound. The more superficial area just below this looks about the same. They once again complained that there are mattress cover is not working although apparently [facility] is been noted to see this many times in the report is that the device is functional 06/18/17; the patient had a probing area on the left ischial tuberosity that was draining purulent fluid last time. This also clearly seemed to have open bone. Culture I did showed pansensitive pseudomonas including third generation cephalosporins. I treated this with cefdinir 300 twice a day for 10 days and things seem to have improved. She has a more superficial wound just underneath this area. Amazingly she has a new air mattress through [facility]. I think they gave this to her as a parking give. In any case this now works according to the patient may have something to do with why the areas are looking better. 07/09/17; the patient has a probing area in the left ischial tuberosity that still has some depth. However this is contracted in terms of the wound orifice although the depth is still roughly the same. There is no undermining. She also has the satellite wound which is more superficial. This appears to have a healthy surface we've been using silver collagen 08/06/17; the patient's wound is over the left ischial tuberosity and a satellite lesion just underneath this. The original wound was actually a deep stage 4 wound. We have made good progress in 2 months and there is no longer exposed bone here. 09/03/17; left ischial tuberosity actually appears to be quite healthy. I think we are making progress. No debridement is required. There is no surrounding erythema 10/01/17 I  follow this patient monthly for her left ischial tuberosity wound. There is 2 areas the original area and a satellite area. The satellite area looks a lot better there is no surrounding erythema. Her husband relates that he is having trouble maintaining the dressing. This has to do with the soft tissue around it. He states he puts the collagen in but he cannot make sure that it stays in even with the ABD pads and tape that he is been using 10/29/17; patient arrives with a better looking noon today. Some of the satellite lesions have closed. using Prisma 11/26/17; the patient has a large cone-shaped area with the tip of the Cone deep within her buttock soft tissue. The walls of the Cone are epithelialized however the base is still open. The area at the base of this looks  moist we've been using silver collagen. Will change to silver alginate 12/31/2017; the wound appears to have come in fairly nicely. Using silver alginate. There is no surrounding maceration or infection 01/28/18; there is still an open area here over the left initial tuberosity. Base of this however looks healthy. There is no surrounding infection 02/25/18; the area of its open is over the left ischial tuberosity. The base of this is where the wound is. This is a large inverted cone-shaped area with the wound at the tip. Dimensions of the wound at the tip are improved. There is a area of denuded skin about halfway towards the tip which her husband thinks may have happened today when he was bathing her. 04/20/17; the area is still open over the left initial tuberosity. 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 This is now completely closed over although it is closed over with a divot and skin over bone at the base of this. Continued aggressive  offloading will be necessary. 12/30/2018 READMISSION This is a 70 year old woman with chronic paraplegia. I picked her up for her care from Dr. Con Memos in this clinic after he departed. She had a stage IV pressure wound over the left ischial tuberosity. She was treated twice for her underlying osteomyelitis and this I believe firstly in 2016 and again in 2017. There were some plans at some point for flap closure of this however she was discovered to have uncontrolled diabetes and I do not think this was ever accomplished. She ultimately healed over in this clinic and was discharged in May. She has a large cone-shaped indentation with the tip of this going towards the left ischial tuberosity. It is not an easy area to examine but at that time I thought all of this was epithelialized. Apparently there was a reopening here shortly after she left the clinic last time. She was admitted to hospital at the end of June for Klebsiella bacteremia felt to be secondary to UTI. A CT scan of the pelvis is listed below and there was initially some concern that she had underlying osteomyelitis although I believe she was seen by infectious disease and that was felt to be not the case: I do not see any new cultures or inflammatory markers IMPRESSION: 1. No CT evidence for acute intra-abdominal or pelvic abnormality. Large volume of stool throughout the colon. 2. Enlarged fatty liver with fat sparing near the gallbladder fossa 3. Cortical scarring right kidney. Bilateral intrarenal stones without hydronephrosis. Thick-walled urinary bladder decompressed by suprapubic catheter 4. Deep left decubitus ulcer with underlying left ischial changes suggesting osteomyelitis. Her husband has been using silver collagen in the wound. She has not been systemically unwell no fever chills eating and drinking well. They rigorously offload this wound only getting up in the wheelchair when she is going to appointments the rest of the  time she is in bed. 10/8; wound measures larger and she now has exposed bone. We have been using silver alginate 11/12 still using silver alginate. The patient saw Dr. Megan Salon of infectious disease. She was started on Augmentin 500 mg twice daily. She is due to follow-up with Dr. Megan Salon I believe next week. Lab work Dr. Megan Salon requested showed a sedimentation rate of 28 and CRP of 20 although her CRP 1 year ago  was 18.8. Sedimentation rate 1 year ago was 11 basic metabolic panel showed a creatinine of 1.12 12/3; the patient followed up with Dr. Megan Salon yesterday. She is still on Augmentin twice daily. This was directed by Dr. Megan Salon. The patient's inflammatory markers have improved which is gratifying. Her C-reactive protein was repeated yesterday and follow-up booked with infectious disease in January. In addition I have been getting secure text messages I think from palliative care through [facility] [employer]. I think they were hoping to provide services to the patient in her home. They could not get a hold of the primary physician and so they reached out to me on 2 separate occasions. 12/17; patient last saw Dr. Megan Salon on 12/2. She is finishing up with Augmentin. Her C-reactive protein was 20 on 10/21, 10.1 on 11/19 and 17 on 12/2. The wound itself still has depth and undermining. We are using Santyl with the backing wet-to-dry 04/27/2019. The wound is gradually clearing up in terms of the surface although it is not filled in that much. Still abuts right against bone 2/4; patient with a deep pressure ulcer over the left ischial tuberosity. I thought she was going to follow-up with infectious disease to follow her inflammatory markers although the patient states that they stated that they did not need to see her unless we felt it was necessary. I will need to check their notes. In any case we ordered moistened silver collagen back with wet-to-dry to fill in the depth of  the wound although apparently [facility] sent silver alginate which they have been using since they were here the last time. Is obviously not what we ordered. 2/25. Not much change in this wound it is over the left ischial tuberosity recurrent wound. We have been using silver collagen with backing wet-to-dry. I think the wound is about the same. There is still some tunneling from about 10-12 o'clock over the ischial tuberosity itself 3/11; pressure ulcer over the left ischial tuberosity. Since she was last here the wound VAC was started and apparently going quite well. We are able to get the home health company that accepts [place] healthcare which is in itself sometimes problematic. There is been improvements in the wound the tunneling seems to be better and is contracted nicely 4/8; 1 month follow-up. Since she was last here we have been using silver collagen under a wound VAC. Some minor contraction I think in wound volume. She is cared for diligently by her husband including pressure relief, incontinence management, nutritional support etc. 6/1; this is almost a 26-monthfollow-up. She is been using silver collagen under wound VAC. Circular area over the left ischial tuberosity. She has been using silver collagen under wound VAC 7/8; 1 month follow-up. Silver collagen under the VAC not really a lot of progress. Tissue at the base of the wound which is right against bone and the tissue next that this does not look completely viable. She is not currently on any antibiotics, she had underlying osteomyelitis I need to look this over 8/16; we are using silver collagen under wound VAC to the left ischial tuberosity wound. Comes in today with absolutely no change in surface area or depth. There is no exposed bone. I did look over her infectious disease notes as I said I would do last time. She last saw Dr. CMegan Salonin December 2020. She completed 6 weeks of Augmentin. This was in response to a bone culture I  did showing methicillin susceptible staph aureus  and Enterococcus. She was supposed to come back to see Dr. Megan Salon at some point although they say that that appointment was canceled unless I chose to recommend return. I think there was supposed to be follow-up with inflammatory markers but I cannot see that that was ever done. She has not been on antibiotics since 9/21; monthly follow-up. We received a call from home health nurse last evening to report green drainage coming out of the wound. Lab work I ordered last time showed a white count of 5.2 a sedimentation rate of 45 and a C-reactive protein of 25 however neither one of the 2 values are substantially different from her previous values in October 2020 or December 2020. Both are slightly higher but only marginally. Otherwise no new complaints from the patient or her husband 10/19; 1 month follow-up. PCR culture I did last time showed medium quantities of Pseudomonas lower quantities Klebsiella and Enterococcus faecalis group B strep and Peptostreptococcus. I gave her Augmentin for 2 weeks. I am not really sure of my choice of this I would not cover Pseudomonas. She is still having green drainage. Wound itself looks satisfactory there is not a lot of depth wound bed looks healthy 11/16; patient has completed the antibiotics still using gentamicin and silver alginate on the wound. There is improvement in the surface area 12/21; in general the area on the buttock looks somewhat better. Surface looks healthy although I do not know that there is been much improvement in the wound volume. We have been using silver alginate and Hydrofera Blue. Less drainage. In passing the husband showed me an abrasion injury on the left anterior tibia. Covered in necrotic surface. He has noticed this for about a week and has been putting silver collagen on it. He is completely uncertain about how this happened 1/25; monthly follow-up. The area on the left buttock is  about the same. This does not go to bone but a fairly deep wound surface of the wound is of questionable viability. The abrasion injury that they showed me last time apparently was closed out by home health because they thought it was healed but certainly is not although it is just about healed. As a result they haven't been applying anything to this area Finally I did discuss with the patient and her husband the idea of an advanced treatment product to try and get a proper base to this wound I was thinking of Puraply however actually the patient points out that her co-pay for coming to visit [place] i.e. the facility, charge would be unaffordable if they have to, on a weekly basis 2/22; pressure ulcer on the left buttock appears deeper to me and abuts on the ischial tuberosity. I thought initially there was exposed bone but there is a rim of tissue over this area. She also has a superficial over the right anterior mid tibia. Been using silver collagen to these areas without much success. I have looked over the patient's past history with regards to the area on the left ischium. She did have underlying osteomyelitis here dating back I think to late 2020. She saw Dr. Megan Salon she received a 6-week course of oral antibiotics in response to a bone culture that I did. This does not appear to be infected but it certainly has not been improving in terms of granulation. I do not believe she has had any recent imaging studies 3/29; 1 month follow-up. Pressure ulcer on the left buttock which she has been dealing with with  for a number of years. She was treated for underlying osteomyelitis at 1.2 or 3 years ago I think with infectious disease help. She also had I think a flap closure by Dr. Leland Johns and that lasted for about a year and then reopened. I have not been able to get this patient to progress towards healing although truthfully the wound is absolutely no worse. We have been using Hydrofera Blue 4/26;  patient presents for 1 week follow-up. She has been using silver collagen to the area every other day. She has home health that comes out once a week to help with dressing changes as well. The patient is interested in trying a skin substitute over this area. She states she is trying to relieve pressure off of it most of the day. 5/24; patient presents for 1 month follow-up. She has been using silver collagen to the area every other day. She has no complaints today. She is interested in the skin substitute. She tries to leave relieve pressure off Her bottom however is not able to most of the day 6/14; using silver collagen to the area over the left ischial tuberosity. Wound does not appear to be doing particularly well. Open to bone 6/28; the patient patient presented last time with a marked deterioration. Depth probing all the way to bone. The bone itself did not look particularly viable. In spite of this the x-ray I did showed longstanding ulcer over the left ischial tuberosity with chronic bone involvement/reaction that was also seen by CT in 2020. Lab work did not show just active infection with a sed rate of 14 and a C-reactive protein of 7.1. Her comprehensive metabolic panel was normal including an albumin of 4.1 white count was 6 7/19; patient was here 3 weeks ago with a marked deterioration in her wound over the left ischial tuberosity. I ordered a CT scan of the area for 1 reason or another this just did not get done. It is now booked for 8 days from now. She was supposed to come back for bone culture and pathology. That did not happen either. We have been using silver collagen. As usual she is diligently looked after by her husband 8/24; 5-week follow-up. Since the patient was last here the biopsy that I did of her ischial tuberosity came back suggesting osteomyelitis. Culture showed strep. I started her on Augmentin. She was seen by infectious disease Dr. Lucianne Lei dam and the Augmentin was  continued and she is still taking it. CT scan did not suggest anything other than chronic osteomyelitis without much change from her previous study. Her C-reactive protein was only 7 and sedimentation rate was 1.1. We are still using silver collagen to the wound. She has home health. Her husband is very diligent in her care for this reason I have never pursued a diverting colostomy. She would not agree to this surgery in any case. Finally we have discussed plastic surgery with him in the past and she is not interested in a myocutaneous flap. 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 Since she was last here she was in the ER last week with abdominal pain. She was found to be impacted however in the course of the review there somebody gave her some IV morphine and apparently she developed hives and blisters. There is still tense blisters on her plantar left fifth and fourth fingers 10/4; the patient has completed her Augmentin and is due to follow-up with Dr. Drucilla Schmidt in early November. Her wound  today measures about the same but looks a little healthier in terms of granulation there is no exposed bone. I note that she was admitted to hospital for 2 days from 9/21 through 9/23 for delirium. She had received Versed for glaucoma surgery ultimately that was felt to be the etiology. Her blood cultures were negative she had 30,000 colonies of Pseudomonas in her urine. She did receive broad-spectrum antibiotic therapy but ultimately her wound on the buttock was not felt to be the cause. As mentioned she has completed her antibiotics still using silver collagen Electronic Signature(s) Signed: 01/09/2021 10:14:24 AM By: Linton Ham MD Entered By: Linton Ham on 01/08/2021 10:55:58 -------------------------------------------------------------------------------- Physical Exam Details Patient Name: Date of Service: [name]. 01/08/2021 10:00 A M Medical Record Number: [id number] Patient  Account Number: [id number] Date of Birth/Sex: Treating RN: 1951-01-27 (70 y.o. [name], [name] Primary Care Provider: [name] Other Clinician: Referring Provider: Treating Provider/Extender: Tommy Rainwater in Treatment: 105 Constitutional Sitting or standing Blood Pressure is within target range for patient.. Pulse regular and within target range for patient.Marland Kitchen Respirations regular, non-labored and within target range.. Temperature is normal and within the target range for the patient.Marland Kitchen Appears in no distress. Notes Wound exam; there is granulation at the base of this although there is not much of it over the underlying bone. There is no obvious evidence of soft tissue infection no purulence nothing that seemed to need to be cultured. Electronic Signature(s) Signed: 01/09/2021 10:14:24 AM By: Linton Ham MD Entered By: Linton Ham on 01/08/2021 10:58:15 -------------------------------------------------------------------------------- Physician Orders Details Patient Name: Date of Service: [name]. 01/08/2021 10:00 [place] Record Number: [id number] Patient Account Number: [id number] Date of Birth/Sex: Treating RN: 1950/05/03 (70 y.o. Sue Lush Primary Care Provider: [name] Other Clinician: Referring Provider: Treating Provider/Extender: Tommy Rainwater in Treatment: [phone] Verbal / Phone Orders: No Diagnosis Coding ICD-10 Coding Code Description [phone] Pressure ulcer of left buttock, stage 4 E11.622 Type 2 diabetes mellitus with other skin ulcer M86.68 Other chronic osteomyelitis, other site Follow-up Appointments Return appointment in 1 month. - Dr. Dellia Nims Other: - Has follow up with ID 02/13/21 Bathing/ Shower/ Hygiene May shower and wash wound with soap and water. - with dressingchanges Edema Control - Lymphedema / SCD / Other Elevate legs to the level of the heart or above for 30 minutes daily and/or when  sitting, a frequency of: Avoid standing for long periods of time. Off-Loading Turn and reposition every 2 hours Non Wound Condition Protect area with: - Cover left3rd and 4th fingers with Calcium Alginate with [place] wound care orders this week; continue Home Health for wound care. May utilize formulary equivalent dressing for wound treatment orders unless otherwise specified. Other Home Health Orders/Instructions: - [facility]ound Treatment Wound #12 - Ischial Tuberosity Wound Laterality: Left Cleanser: Soap and Water (Home Health) 1 x Per Day/30 Days Discharge Instructions: May shower and wash wound with dial antibacterial soap and water prior to dressing change. Cleanser: Wound Cleanser (Home Health) 1 x Per Day/30 Days Discharge Instructions: Cleanse the wound with wound cleanser prior to applying a clean dressing using gauze sponges, not tissue or cotton balls. Peri-Wound Care: Skin Prep (Home Health) 1 x Per Day/30 Days Discharge Instructions: Use skin prep as directed Prim Dressing: Hydrofera Blue Ready Foam, 2.5 x2.5 in 1 x Per Day/30 Days ary Discharge Instructions: cut to fit into wound Secondary Dressing: ComfortFoam Border,  4x4 in (silicone border) (Home Health) 1 x Per Day/30 Days Discharge Instructions: Apply over primary dressing as directed. Electronic Signature(s) Signed: 01/08/2021 5:11:46 PM By: Lorrin Jackson Signed: 01/09/2021 10:14:24 AM By: Linton Ham MD Entered By: Lorrin Jackson on 01/08/2021 12:16:20 -------------------------------------------------------------------------------- Problem List Details Patient Name: Date of Service: [name]. 01/08/2021 10:00 A M Medical Record Number: [id number] Patient Account Number: [id number] Date of Birth/Sex: Treating RN: 09-03-1950 (70 y.o. Sue Lush Primary Care Provider: [name] Other Clinician: Referring Provider: Treating Provider/Extender: Tommy Rainwater in Treatment: 53 Active Problems ICD-10 Encounter Code Description Active Date MDM Diagnosis [phone] Pressure ulcer of left buttock, stage 4 12/30/2018 No Yes E11.622 Type 2 diabetes mellitus with other skin ulcer 12/30/2018 No Yes M86.68 Other chronic osteomyelitis, other site 11/28/2020 No Yes Inactive Problems ICD-10 Code Description Active Date Inactive Date G82.20 Paraplegia, unspecified 12/30/2018 12/30/2018 M86.68 Other chronic osteomyelitis, other site 02/17/2019 02/17/2019 S80.811D Abrasion, right lower leg, subsequent encounter 03/27/2020 03/27/2020 L97.811 Non-pressure chronic ulcer of other part of right lower leg limited to breakdown of skin 03/27/2020 03/27/2020 Resolved Problems Electronic Signature(s) Signed: 01/09/2021 10:14:24 AM By: Linton Ham MD Entered By: Linton Ham on 01/08/2021 10:54:09 -------------------------------------------------------------------------------- Progress Note Details Patient Name: Date of Service: [name]. 01/08/2021 10:00 A M Medical Record Number: [id number] Patient Account Number: [id number] Date of Birth/Sex: Treating RN: July 07, 1950 (70 y.o. [name], [name] Primary Care Provider: [name] Other Clinician: Referring Provider: Treating Provider/Extender: Tommy Rainwater in Treatment: 105 Subjective History of Present Illness (HPI) The following HPI elements were documented for the patient's wound: Location: open ulceration of the left gluteal area, left heel and right ankle for about 5 months. Quality: Patient reports No Pain. Severity: Patient states wound(s) are getting worse. Duration: Patient has had the wound for > 5 months prior to seeking treatment at the wound center Context: The wound occurred when the patient has been paraplegic for about 3 years. Modifying Factors: Wound improving due to current treatment. Associated Signs and Symptoms: Patient reports having foul  odor. this 70 year old patient who is known to have hypertension, hypothyroidism, breast cancer, chronic pain syndrome, paraplegia was noted to have a left gluteal decubitus ulcer and was brought into the hospital. During the course of her hospitalization she was debrided in the operating room by ankle wound. Bone cultures were taken at that time but were negative but clinically she was treated for osteomyelitis because of the probing down to bone and open exposed bone. Home health has been giving her antibioticss which include vancomycin and Zosyn. The patient was a smoker until about 3 weeks ago and used to smoke about 10 cigarettes a day for a long while. 12/13/2014 - details of her operative note from 11/03/2014 were reviewed -- PROCEDURE: 1. Excisional debridement skin, subcutaneous, muscle left ischium 35 cm2 2. Excisional debridement skin, subcutaneous tissue left heel 27 cm2 3. Excisional debridement right ankle skin, subcutaneous, bone 30 cm2 01/24/2015 -- she has some issues with her wheelchair cushion but other than that is doing very well and has received Podus boots for her feet. 02/14/2015 -- she was using her old offloading boots and this seemed to have caused her a new pressure ulcer on the left posterior heel near the superior part just below the Achilles tendon. 03/07/2015 -- she has a new ulceration just to the left of the midline on her sacral region more on the left buttock and this has  been there for Dr. Leland Johns and had all the wounds sharply debrided. The debridement was done for the left ischial wound, the left heel wound and the right about a week. 08/22/2015 -- was recently admitted to hospital between May 5 and 08/13/2015, with sepsis and leukocytosis due to a UTI. she was treated for a sepsis complicating Escherichia coli UTI and kidney stones. She also had metabolic and careful up at the secondary to pyelonephritis. He received broad-spectrum antibiotics initially and  then received Macrobid as per urology. She was sent home on nitrofurantoin. during her admission she had a CT scan which showed exposed left ischial tuberosity without evidence of osteolysis. 09/12/2015-- the patient is having some issues with her air mattress and would like to get a opinion from medical modalities. 10/10/2015 -- the issue with her air mattress has not yet been sorted out and the new problem seems to be a lot of odor from the wound VAC. 11/27/2015 -- the patient was admitted to the hospital between July 23 and 10/31/2015. Her problems were sepsis, osteomyelitis of the pelvic bone and acute pyelonephritis. CT of the abdomen and pelvis was consistent with a left-sided pyelonephritis with hydronephrosis and also just showed new sclerosis of the posterior portion of the left anterior pubic ramus suggestive of periosteal reaction consistent with osteomyelitis. She was treated for the osteomyelitis with infectious disease consult recommending 6 weeks of IV antibiotics including vancomycin and Rocephin and the antibiotics were to go on until 12/10/2015. He was seen by Dr. Iran Planas plastic surgery and Dr. Linus Salmons of infectious disease. She had a suprapubic catheter placed during the admission. CT scan done on 10/28/2015 showed specifically -- New sclerosis of the posterior portion of the left inferior pubic ramus with aggressive periosteal reaction, consistent with osteomyelitis, with adjacent soft tissue gas compatible with previously described decubitus ulcer. 12/12/2015 -- she was recently seen by Dr. Linus Salmons, who noted good improvement and CRP and ESR compared to before and he has stopped her antibiotic as per plans to finish on September 4. The patient was encouraged to continue with wound care and consider hyperbaric oxygen therapy. Today she tells me that she has consented to undergo hyperbaric oxygen therapy and we can start the paperwork. 01/02/2016 -- her PCP had gained about 3 years  but she still persists in having problems during hyperbaric oxygen therapy with some discomfort in the ears. 01/09/16; pressure area with underlying osteomyelitis in the left buttock. Wound bed itself has some slight amount of grayish surface slough however I do not think any debridement was necessary. There is no exposed bone soft tissue appears stable. She is using a wound VAC 01/16/16; back for weekly wound review in conjunction with HBO. She has a deep wound over the left initial tuberosity previously treated with 6 weeks of IV antibiotics for osteomyelitis. Wound bed looks reasonably healthy although the base of this is still precariously close to bone. She has been using a wound VAC. 01/23/2016 -- she has completed her course of antibiotics and this week the only new thing is her right great toe nail was avulsed and she has got an open wound over the nailbed. 01/31/16 she has completed her course of antibiotics. Her right great toenail avulsed last week and she's been using silver alginate for this as well. Still using a wound VAC to the substantial stage IV wound over the left ischial tuberosity 03/05/2016 -- the patient has had a opinion from the plastic surgery group at [facility] and  details of this are not available yet but the patient's verbal report has been heard by me. Did not sound like there was any optimistic discussion regarding reconstruction and the net result would be to continue with the wound VAC application. I will await the official reports. Addendum: -- she was seen at [facility] by Dr. Tressa Busman. After a thorough review and from what I understand spending 45 minutes with the patient his assessment has been noted by me in detail and the management options were: 1. Continued pressure offloading and wound care versus operative procedures including wound excision 2. Soft tissue and bone sampling 3. If the wound gets larger wound  closure would be done using a variety of plastic surgical techniques including but not limited to skin substitute, possible skin graft, local versus regional flaps, negative pressure dressing application. 4. He discussed with her details of flap surgery and the risks associated 5. He made a comment that since the patient was operated on by Dr. Leland Johns of [facility] plastic surgery unit in [place] the patient may continue to follow-up there for further evaluation for surgical flap closure in the future. 03/19/2016 -- the patient continues to be rather depressed and frustrated with her lack of rapid progress in healing this wound especially because she thought after hyperbaric oxygen therapy the wound would heal extremely fast. She now understands that was not the implied benefit on wound care which was the recommendation for hyperbaric oxygen therapy. I have had a lengthy discussion with the patient and her husband regarding her options: 1. Continue with collagen and wound VAC for the primary dressing and offloading and all supportive care. 2. See Dr. Iran Planas for possible placement of Acell or Integra in the OR. 3. get a second opinion from a wound care center and surrounding regions/counties 05/07/2016 -- Note from Dr. Celedonio Miyamoto, who noted that the patient has declined flap surgery. She has discussed application of A cell, and try a few applications to see how the wound progresses. She is also recommended that we could apply products here in the wound center, like Oasis. during her preop workup it was found that her hemoglobin A1c was 11% and she has now been diagnosed as having diabetes mellitus and has been put on appropriate treatment by her PCP 05/28/2016 -- tells me her blood sugars have been doing well and she has an appointment to see her PCP in the next couple of weeks to check her hemoglobin A1c. Other than that she continues to do well. 06/25/2016 -- have not seen  her back for the last month but she says her health has been about the same and she has an appointment to check the A1c next week 09/10/16 ---- was seen by Dr. Celedonio Miyamoto -- who applied Acell and saw her back in follow-up. She has recommended silver alginate to the wound every other day and cover with foam. If no significant drainage could transition to collagen every other day. She recommended discontinuing wound VAC. There were no plans to repeat application of Acell. The patient expressed that her husband could do the wound care as going to the Wound Ctr., would cost several $100 for each visit. 10/21/2016 -- her insurance company is getting her new mattress and she is pleased about that. Other than that she has been doing dressings with PolyMem Silver and has been doing very well 02/18/2017 -- she has gone through several changes of her mattress and has  not been pleased with any of them. The ventricles are still working on trying to fit her with the appropriate low air-loss mattress. She has a new wound on the gluteal area which is clearly separated from the original wound. 03/25/17-she is here in follow-up evaluation for her left ischialpressure ulcer. She remains unsatisfied with her pressure mattress. She admits to sitting multiple hours a day, in the bed. We have discussed offloading options. The wound does not appear infected. Nutrition does not appear to be a concern. Will follow-up in 4 weeks, if wound continues to be stalled may consider x-ray to evaluate for refractory osteomyelitis. 04/21/17; this is a patient that I don't know all that well. She has a chronic wound which at one point had underlying osteomyelitis in the left ischial tuberosity. This is a stage IV pressure ulcer. Over the last 3 months she has a stage II wound inferiorly to the original wound. The last time she was here her dressing was changed to silver collagen although the patient's husband who changes the dressing  said that the collagen stuck to the wound and remove skin from the superficial area therefore he switched back to [place] 05/13/17; this is a patient we've been following for a left ischial tuberosity wound which was stage IV at one point had underlying osteomyelitis. Over the last several months she's had a stage II wound just inferior and medial to the related to the wound. According to her husband he is using Endoform layer with collagen although this is not what I had last time. According to her husband they are using Elgie Congo with collagen although I don't quite know how that started. She was hospitalized from 1/20 through 04/30/16. This was related to a UTI. Her blood cultures were negative, urine culture showed multiple species. She did have a CT scan of the abdomen and pelvis which documented chronic osteomyelitis in the area of the wound inflammatory markers were unremarkable. She has had prior knowledge of osteomyelitis. It looks as though she received IV antibiotics in 2017 and was treated with a course of hyperbaric oxygen. 05/28/17; the wound over the left ischial tuberosity is deeper today and abuts clearly on bone. Nursing intake reported drainage. I therefore culture of the wound. The more superficial area just below this looks about the same. They once again complained that there are mattress cover is not working although apparently [facility] is been noted to see this many times in the report is that the device is functional 06/18/17; the patient had a probing area on the left ischial tuberosity that was draining purulent fluid last time. This also clearly seemed to have open bone. Culture I did showed pansensitive pseudomonas including third generation cephalosporins. I treated this with cefdinir 300 twice a day for 10 days and things seem to have improved. She has a more superficial wound just underneath this area. Amazingly she has a new air mattress through [facility]. I think they gave this to her as a parking give. In any case this now works according to the patient may have something to do with why the areas are looking better. 07/09/17; the patient has a probing area in the left ischial tuberosity that still has some depth. However this is contracted in terms of the wound orifice although the depth is still roughly the same. There is no undermining. ooShe also has the satellite wound which is more superficial. This appears to have a healthy surface we've been using  silver collagen 08/06/17; the patient's wound is over the left ischial tuberosity and a satellite lesion just underneath this. The original wound was actually a deep stage 4 wound. We have made good progress in 2 months and there is no longer exposed bone here. 09/03/17; left ischial tuberosity actually appears to be quite healthy. I think we are making progress. No debridement is required. There is no surrounding erythema 10/01/17 I follow this patient monthly for her left ischial tuberosity wound. There is 2 areas the original area and a satellite area. The satellite area looks a lot better there is no surrounding erythema. Her husband relates that he is having trouble maintaining the dressing. This has to do with the soft tissue around it. He states he puts the collagen in but he cannot make sure that it stays in even with the ABD pads and tape that he is been using 10/29/17; patient arrives with a better looking noon today. Some of the satellite lesions have closed. using Prisma 11/26/17; the patient has a large cone-shaped area with the tip of the Cone deep within her buttock soft tissue. The walls of the Cone are epithelialized however the base is still open. The area at the base of this looks moist we've been using silver collagen. Will change to silver alginate 12/31/2017; the wound appears to have come in fairly nicely. Using silver alginate. There is no surrounding maceration or  infection 01/28/18; there is still an open area here over the left initial tuberosity. Base of this however looks healthy. There is no surrounding infection 02/25/18; the area of its open is over the left ischial tuberosity. The base of this is where the wound is. This is a large inverted cone-shaped area with the wound at the tip. Dimensions of the wound at the tip are improved. There is a area of denuded skin about halfway towards the tip which her husband thinks may have happened today when he was bathing her. 04/20/17; the area is still open over the left initial tuberosity. 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 This is now completely closed over although it is closed over with a divot and skin over bone at the base of this. Continued aggressive offloading will be necessary. 12/30/2018 READMISSION This is a 70 year old woman with chronic paraplegia. I picked her up for her care from Dr. Con Memos in this clinic after he departed. She had a stage IV pressure wound over the left ischial tuberosity. She was treated twice for her underlying osteomyelitis and this I believe firstly in 2016 and again in 2017. There were some plans at some point for flap closure of this however she was discovered to have uncontrolled diabetes and I do not think this was ever accomplished. She ultimately healed over in this clinic and was discharged in May. She has a large cone-shaped indentation with the tip of this going towards the left ischial tuberosity. It is not an easy area to examine but at that time I thought all of this was epithelialized. Apparently there was a reopening here shortly after she left the clinic last time. She was admitted to hospital at the end of June for Klebsiella bacteremia felt to be secondary to  UTI. A CT scan of the pelvis is listed below and there was initially  some concern that she had underlying osteomyelitis although I believe she was seen by infectious disease and that was felt to be not the case: I do not see any new cultures or inflammatory markers IMPRESSION: 1. No CT evidence for acute intra-abdominal or pelvic abnormality. Large volume of stool throughout the colon. 2. Enlarged fatty liver with fat sparing near the gallbladder fossa 3. Cortical scarring right kidney. Bilateral intrarenal stones without hydronephrosis. Thick-walled urinary bladder decompressed by suprapubic catheter 4. Deep left decubitus ulcer with underlying left ischial changes suggesting osteomyelitis. Her husband has been using silver collagen in the wound. She has not been systemically unwell no fever chills eating and drinking well. They rigorously offload this wound only getting up in the wheelchair when she is going to appointments the rest of the time she is in bed. 10/8; wound measures larger and she now has exposed bone. We have been using silver alginate 11/12 still using silver alginate. The patient saw Dr. Megan Salon of infectious disease. She was started on Augmentin 500 mg twice daily. She is due to follow-up with Dr. Megan Salon I believe next week. Lab work Dr. Megan Salon requested showed a sedimentation rate of 28 and CRP of 20 although her CRP 1 year ago was 18.8. Sedimentation rate 1 year ago was 11 basic metabolic panel showed a creatinine of 1.12 12/3; the patient followed up with Dr. Megan Salon yesterday. She is still on Augmentin twice daily. This was directed by Dr. Megan Salon. The patient's inflammatory markers have improved which is gratifying. Her C-reactive protein was repeated yesterday and follow-up booked with infectious disease in January. In addition I have been getting secure text messages I think from palliative care through [facility] [employer]. I think they were  hoping to provide services to the patient in her home. They could not get a hold of the primary physician and so they reached out to me on 2 separate occasions. 12/17; patient last saw Dr. Megan Salon on 12/2. She is finishing up with Augmentin. Her C-reactive protein was 20 on 10/21, 10.1 on 11/19 and 17 on 12/2. The wound itself still has depth and undermining. We are using Santyl with the backing wet-to-dry 04/27/2019. The wound is gradually clearing up in terms of the surface although it is not filled in that much. Still abuts right against bone 2/4; patient with a deep pressure ulcer over the left ischial tuberosity. I thought she was going to follow-up with infectious disease to follow her inflammatory markers although the patient states that they stated that they did not need to see her unless we felt it was necessary. I will need to check their notes. In any case we ordered moistened silver collagen back with wet-to-dry to fill in the depth of the wound although apparently [facility] sent silver alginate which they have been using since they were here the last time. Is obviously not what we ordered. 2/25. Not much change in this wound it is over the left ischial tuberosity recurrent wound. We have been using silver collagen with backing wet-to-dry. I think the wound is about the same. There is still some tunneling from about 10-12 o'clock over the ischial tuberosity itself 3/11; pressure ulcer over the left ischial tuberosity. Since she was last here the wound VAC was started and apparently going quite well. We are able to get the home health company that accepts [place] healthcare which is in itself sometimes problematic. There is been improvements in the wound the tunneling seems to be  better and is contracted nicely 4/8; 1 month follow-up. Since she was last here we have been using silver collagen under a wound VAC. Some minor contraction I think in wound volume. She is cared for diligently by her  husband including pressure relief, incontinence management, nutritional support etc. 6/1; this is almost a 41-month follow-up. She is been using silver collagen under wound VAC. Circular area over the left ischial tuberosity. She has been using silver collagen under wound VAC 7/8; 1 month follow-up. Silver collagen under the VAC not really a lot of progress. Tissue at the base of the wound which is right against bone and the tissue next that this does not look completely viable. She is not currently on any antibiotics, she had underlying osteomyelitis I need to look this over 8/16; we are using silver collagen under wound VAC to the left ischial tuberosity wound. Comes in today with absolutely no change in surface area or depth. There is no exposed bone. I did look over her infectious disease notes as I said I would do last time. She last saw Dr. Megan Salon in December 2020. She completed 6 weeks of Augmentin. This was in response to a bone culture I did showing methicillin susceptible staph aureus and Enterococcus. She was supposed to come back to see Dr. Megan Salon at some point although they say that that appointment was canceled unless I chose to recommend return. I think there was supposed to be follow-up with inflammatory markers but I cannot see that that was ever done. She has not been on antibiotics since 9/21; monthly follow-up. We received a call from home health nurse last evening to report green drainage coming out of the wound. Lab work I ordered last time showed a white count of 5.2 a sedimentation rate of 45 and a C-reactive protein of 25 however neither one of the 2 values are substantially different from her previous values in October 2020 or December 2020. Both are slightly higher but only marginally. Otherwise no new complaints from the patient or her husband 10/19; 1 month follow-up. PCR culture I did last time showed medium quantities of Pseudomonas lower quantities Klebsiella and  Enterococcus faecalis group B strep and Peptostreptococcus. I gave her Augmentin for 2 weeks. I am not really sure of my choice of this I would not cover Pseudomonas. She is still having green drainage. Wound itself looks satisfactory there is not a lot of depth wound bed looks healthy 11/16; patient has completed the antibiotics still using gentamicin and silver alginate on the wound. There is improvement in the surface area 12/21; in general the area on the buttock looks somewhat better. Surface looks healthy although I do not know that there is been much improvement in the wound volume. We have been using silver alginate and Hydrofera Blue. Less drainage. In passing the husband showed me an abrasion injury on the left anterior tibia. Covered in necrotic surface. He has noticed this for about a week and has been putting silver collagen on it. He is completely uncertain about how this happened 1/25; monthly follow-up. The area on the left buttock is about the same. This does not go to bone but a fairly deep wound surface of the wound is of questionable viability. The abrasion injury that they showed me last time apparently was closed out by home health because they thought it was healed but certainly is not although it is just about healed. As a result they haven't been applying anything to this area  Finally I did discuss with the patient and her husband the idea of an advanced treatment product to try and get a proper base to this wound I was thinking of Puraply however actually the patient points out that her co-pay for coming to visit [place] i.e. the facility, charge would be unaffordable if they have to, on a weekly basis 2/22; pressure ulcer on the left buttock appears deeper to me and abuts on the ischial tuberosity. I thought initially there was exposed bone but there is a rim of tissue over this area. She also has a superficial over the right anterior mid tibia. Been using silver collagen to  these areas without much success. I have looked over the patient's past history with regards to the area on the left ischium. She did have underlying osteomyelitis here dating back I think to late 2020. She saw Dr. Megan Salon she received a 6-week course of oral antibiotics in response to a bone culture that I did. This does not appear to be infected but it certainly has not been improving in terms of granulation. I do not believe she has had any recent imaging studies 3/29; 1 month follow-up. Pressure ulcer on the left buttock which she has been dealing with with for a number of years. She was treated for underlying osteomyelitis at 1.2 or 3 years ago I think with infectious disease help. She also had I think a flap closure by Dr. Leland Johns and that lasted for about a year and then reopened. I have not been able to get this patient to progress towards healing although truthfully the wound is absolutely no worse. We have been using Hydrofera Blue 4/26; patient presents for 1 week follow-up. She has been using silver collagen to the area every other day. She has home health that comes out once a week to help with dressing changes as well. The patient is interested in trying a skin substitute over this area. She states she is trying to relieve pressure off of it most of the day. 5/24; patient presents for 1 month follow-up. She has been using silver collagen to the area every other day. She has no complaints today. She is interested in the skin substitute. She tries to leave relieve pressure off Her bottom however is not able to most of the day 6/14; using silver collagen to the area over the left ischial tuberosity. Wound does not appear to be doing particularly well. Open to bone 6/28; the patient patient presented last time with a marked deterioration. Depth probing all the way to bone. The bone itself did not look particularly viable. In spite of this the x-ray I did showed longstanding ulcer over the  left ischial tuberosity with chronic bone involvement/reaction that was also seen by CT in 2020. Lab work did not show just active infection with a sed rate of 14 and a C-reactive protein of 7.1. Her comprehensive metabolic panel was normal including an albumin of 4.1 white count was 6 7/19; patient was here 3 weeks ago with a marked deterioration in her wound over the left ischial tuberosity. I ordered a CT scan of the area for 1 reason or another this just did not get done. It is now booked for 8 days from now. She was supposed to come back for bone culture and pathology. That did not happen either. We have been using silver collagen. As usual she is diligently looked after by her husband 8/24; 5-week follow-up. Since the patient was last here the  biopsy that I did of her ischial tuberosity came back suggesting osteomyelitis. Culture showed strep. I started her on Augmentin. She was seen by infectious disease Dr. Lucianne Lei dam and the Augmentin was continued and she is still taking it. CT scan did not suggest anything other than chronic osteomyelitis without much change from her previous study. Her C-reactive protein was only 7 and sedimentation rate was 1.1. We are still using silver collagen to the wound. She has home health. Her husband is very diligent in her care for this reason I have never pursued a diverting colostomy. She would not agree to this surgery in any case. Finally we have discussed plastic surgery with him in the past and she is not interested in a myocutaneous flap. 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 Since she was last here she was in the ER last week with abdominal pain. She was found to be impacted however in the course of the review there somebody gave her some IV morphine and apparently she developed hives and blisters. There is still tense blisters on her plantar left fifth and fourth fingers 10/4; the patient has completed her Augmentin and is due to follow-up with Dr.  Drucilla Schmidt in early November. Her wound today measures about the same but looks a little healthier in terms of granulation there is no exposed bone. I note that she was admitted to hospital for 2 days from 9/21 through 9/23 for delirium. She had received Versed for glaucoma surgery ultimately that was felt to be the etiology. Her blood cultures were negative she had 30,000 colonies of Pseudomonas in her urine. She did receive broad-spectrum antibiotic therapy but ultimately her wound on the buttock was not felt to be the cause. As mentioned she has completed her antibiotics still using silver collagen Objective Constitutional Sitting or standing Blood Pressure is within target range for patient.. Pulse regular and within target range for patient.Marland Kitchen Respirations regular, non-labored and within target range.. Temperature is normal and within the target range for the patient.Marland Kitchen Appears in no distress. Vitals Time Taken: 10:15 AM, Height: 63 in, Weight: 185 lbs, BMI: 32.8, Temperature: 97.9 F, Pulse: 73 bpm, Respiratory Rate: 18 breaths/min, Blood Pressure: 122/73 mmHg. General Notes: Wound exam; there is granulation at the base of this although there is not much of it over the underlying bone. There is no obvious evidence of soft tissue infection no purulence nothing that seemed to need to be cultured. Integumentary (Hair, Skin) Wound #12 status is Open. Original cause of wound was Pressure Injury. The date acquired was: 09/06/2018. The wound has been in treatment 105 weeks. The wound is located on the Left Ischial Tuberosity. The wound measures 2cm length x 1.2cm width x 1cm depth; 1.885cm^2 area and 1.885cm^3 volume. There is Fat Layer (Subcutaneous Tissue) exposed. There is no tunneling or undermining noted. There is a medium amount of purulent drainage noted. The wound margin is well defined and not attached to the wound base. There is large (67-100%) red, pink granulation within the wound bed. There is  a small (1-33%) amount of necrotic tissue within the wound bed including Adherent Slough. 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 - Dr. Dellia Nims Other: - Has follow up with ID 02/13/21 Bathing/ Shower/ Hygiene: May shower and wash wound with soap and water. - with dressingchanges Edema Control -  Lymphedema / SCD / Other: Elevate legs to the level of the heart or above for 30 minutes daily and/or when sitting, a frequency of: Avoid standing for long periods of time. Off-Loading: Turn and reposition every 2 hours Non Wound Condition: Protect area with: - Cover left3rd and 4th fingers with Calcium Alginate with Silver Home Health: New wound care orders this week; continue Home Health for wound care. May utilize formulary equivalent dressing for wound treatment orders unless otherwise specified. Other Home Health Orders/Instructions: - [facility] WOUND #12: - Ischial Tuberosity Wound Laterality: Left Cleanser: Soap and Water (Home Health) 1 x Per Day/30 Days Discharge Instructions: May shower and wash wound with dial antibacterial soap and water prior to dressing change. Cleanser: Wound Cleanser (Home Health) 1 x Per Day/30 Days Discharge Instructions: Cleanse the wound with wound cleanser prior to applying a clean dressing using gauze sponges, not tissue or cotton balls. Peri-Wound Care: Skin Prep (Home Health) 1 x Per Day/30 Days Discharge Instructions: Use skin prep as directed Prim Dressing: Hydrofera Blue Ready Foam, 2.5 x2.5 in 1 x Per Day/30 Days ary Discharge Instructions: cut to fit into wound Secondary Dressing: ComfortFoam Border, 4x4 in (silicone border) ([address]) 1 x Per Day/30 Days Discharge Instructions: Apply over primary dressing as directed. 1. I do not think there is been much change in  this since the last time I saw the patient at the beginning of August. She has completed her antibiotics. It certainly not any worse there is depth here and granulation right over bone. We are going to use [facility] on this for the next month 2. I also brought up the idea of advanced treatment productso [employer] however that would mean a weekly visit to clinic. Electronic Signature(s) Signed: 01/09/2021 10:14:24 AM By: Linton Ham MD Entered By: Linton Ham on 01/08/2021 11:00:14 -------------------------------------------------------------------------------- SuperBill Details Patient Name: Date of Service: [name] 01/08/2021 Medical Record Number: [id number] Patient Account Number: [id number] Date of Birth/Sex: Treating RN: 1950/12/31 (70 y.o. Sue Lush Primary Care Provider: [name] Other Clinician: Referring Provider: Treating Provider/Extender: Tommy Rainwater in Treatment: 105 Diagnosis Coding ICD-10 Codes Code Description [phone] Pressure ulcer of left buttock, stage 4 E11.622 Type 2 diabetes mellitus with other skin ulcer M86.68 Other chronic osteomyelitis, other site Facility Procedures Physician Procedures : CPT4 Code Description Modifier [id number] - WC PHYS LEVEL 3 - EST PT ICD-10 Diagnosis Description 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, stage 4 M86.68 Other chronic osteomyelitis, other site Quantity: 1 Electronic Signature(s) Signed: 01/09/2021 10:14:24 AM By: Linton Ham MD Entered By: Linton Ham on 01/08/2021 11:00:34

## 2021-01-09 NOTE — Progress Notes (Signed)
[name], [name] ([id number]) Visit Report for 01/08/2021 Arrival Information Details Patient Name: Date of Service: [name], [name] 01/08/2021 10:00 [place] Record Number: [id number] Patient Account Number: [id number] Date of Birth/Sex: Treating RN: 1951-03-12 (70 y.o. Sue Lush Primary Care Gianluca Chhim: [name] Other Clinician: Referring Brycen Bean: Treating Broedy Osbourne/Extender: Tommy Rainwater in Treatment: 64 Visit Information History Since Last Visit Added or deleted any medications: No Patient Arrived: Wheel Chair Any new allergies or adverse reactions: No Arrival Time: 10:12 Had a fall or experienced change in No Accompanied By: husband activities of daily living that may affect Transfer Assistance: Manual risk of falls: Patient Identification Verified: Yes Signs or symptoms of abuse/neglect since last visito No Secondary Verification Process Completed: Yes Hospitalized since last visit: No Patient Requires Transmission-Based Precautions: No Implantable device outside of the clinic excluding No Patient Has Alerts: No cellular tissue based products placed in the center since last visit: Has Dressing in Place as Prescribed: Yes Pain Present Now: No Electronic Signature(s) Signed: 01/08/2021 5:11:46 PM By: Lorrin Jackson Entered By: Lorrin Jackson on 01/08/2021 10:15:53 -------------------------------------------------------------------------------- Clinic Level of Care Assessment Details Patient Name: Date of Service: [name] 01/08/2021 10:00 A M Medical Record Number: [id number] Patient Account Number: [id number] Date of Birth/Sex: Treating RN: Aug 26, 1950 (70 y.o. Sue Lush Primary Care Jery Hollern: [name] Other Clinician: Referring Samaya Boardley: Treating Lataya Varnell/Extender: Tommy Rainwater in Treatment: 105 Clinic Level of Care Assessment Items TOOL 4 Quantity Score X- 1 0 Use when only an EandM is  performed on FOLLOW-UP visit ASSESSMENTS - Nursing Assessment / Reassessment X- 1 10 Reassessment of Co-morbidities (includes updates in patient status) X- 1 5 Reassessment of Adherence to Treatment Plan ASSESSMENTS - Wound and Skin A ssessment / Reassessment X - Simple Wound Assessment / Reassessment - one wound 1 5 []  - 0 Complex Wound Assessment / Reassessment - multiple wounds []  - 0 Dermatologic / Skin Assessment (not related to wound area) ASSESSMENTS - Focused Assessment []  - 0 Circumferential Edema Measurements - multi extremities []  - 0 Nutritional Assessment / Counseling / Intervention []  - 0 Lower Extremity Assessment (monofilament, tuning fork, pulses) []  - 0 Peripheral Arterial Disease Assessment (using hand held doppler) ASSESSMENTS - Ostomy and/or Continence Assessment and Care []  - 0 Incontinence Assessment and Management []  - 0 Ostomy Care Assessment and Management (repouching, etc.) PROCESS - Coordination of Care []  - 0 Simple Patient / Family Education for ongoing care X- 1 20 Complex (extensive) Patient / Family Education for ongoing care X- 1 10 Staff obtains Programmer, systems, Records, T Results / Process Orders est []  - 0 Staff telephones HHA, Nursing Homes / Clarify orders / etc []  - 0 Routine Transfer to another Facility (non-emergent condition) []  - 0 Routine Hospital Admission (non-emergent condition) []  - 0 New Admissions / Biomedical engineer / Ordering NPWT Apligraf, etc. , []  - 0 Emergency Hospital Admission (emergent condition) []  - 0 Simple Discharge Coordination []  - 0 Complex (extensive) Discharge Coordination PROCESS - Special Needs []  - 0 Pediatric / Minor Patient Management []  - 0 Isolation Patient Management []  - 0 Hearing / Language / Visual special needs []  - 0 Assessment of Community assistance (transportation, D/C planning, etc.) []  - 0 Additional assistance / Altered mentation []  - 0 Support Surface(s) Assessment  (bed, cushion, seat, etc.) INTERVENTIONS - Wound Cleansing / Measurement X - Simple Wound Cleansing - one wound 1 5 []  - 0 Complex Wound Cleansing -  multiple wounds X- 1 5 Wound Imaging (photographs - any number of wounds) []  - 0 Wound Tracing (instead of photographs) X- 1 5 Simple Wound Measurement - one wound []  - 0 Complex Wound Measurement - multiple wounds INTERVENTIONS - Wound Dressings []  - 0 Small Wound Dressing one or multiple wounds X- 1 15 Medium Wound Dressing one or multiple wounds []  - 0 Large Wound Dressing one or multiple wounds []  - 0 Application of Medications - topical []  - 0 Application of Medications - injection INTERVENTIONS - Miscellaneous []  - 0 External ear exam []  - 0 Specimen Collection (cultures, biopsies, blood, body fluids, etc.) []  - 0 Specimen(s) / Culture(s) sent or taken to Lab for analysis []  - 0 Patient Transfer (multiple staff / Harrel Lemon Lift / Similar devices) []  - 0 Simple Staple / Suture removal (25 or less) []  - 0 Complex Staple / Suture removal (26 or more) []  - 0 Hypo / Hyperglycemic Management (close monitor of Blood Glucose) []  - 0 Ankle / Brachial Index (ABI) - do not check if billed separately X- 1 5 Vital Signs Has the patient been seen at the hospital within the last three years: Yes Total Score: 85 Level Of Care: New/Established - Level 3 Electronic Signature(s) Signed: 01/08/2021 5:11:46 PM By: Lorrin Jackson Entered By: Lorrin Jackson on 01/08/2021 10:51:24 -------------------------------------------------------------------------------- Encounter Discharge Information Details Patient Name: Date of Service: [name] Pulse M. 01/08/2021 10:00 A M Medical Record Number: [id number] Patient Account Number: [id number] Date of Birth/Sex: Treating RN: [birth year] (70 y.o. Sue Lush Primary Care Crestina Strike: [name] Other Clinician: Referring Uyen Eichholz: Treating Milagro Belmares/Extender: Tommy Rainwater in  Treatment: 105 Encounter Discharge Information Items Discharge Condition: Stable Ambulatory Status: Wheelchair Discharge Destination: Home Transportation: Private Auto Accompanied ByRaechel Chute Schedule Follow-up Appointment: Yes Clinical Summary of Care: Provided on 01/08/2021 Form Type Recipient Paper Patient Patient Electronic Signature(s) Signed: 01/08/2021 12:22:29 PM By: Lorrin Jackson Entered By: Lorrin Jackson on 01/08/2021 12:22:29 -------------------------------------------------------------------------------- Lower Extremity Assessment Details Patient Name: Date of Service: [name]. 01/08/2021 10:00 A M Medical Record Number: [id number] Patient Account Number: [id number] Date of Birth/Sex: Treating RN: 22-Oct-1950 (70 y.o. Sue Lush Primary Care Tiffanye Hartmann: [name] Other Clinician: Referring Hyatt Capobianco: Treating Velmer Woelfel/Extender: Tommy Rainwater in Treatment: 105 Electronic Signature(s) Signed: 01/08/2021 5:11:46 PM By: Lorrin Jackson Entered By: Lorrin Jackson on 01/08/2021 10:12:54 -------------------------------------------------------------------------------- Multi Wound Chart Details Patient Name: Date of Service: [name], [name] Breach. 01/08/2021 10:00 A M Medical Record Number: [id number] Patient Account Number: [id number] Date of Birth/Sex: Treating RN: 08-14-1950 (70 y.o. [name], [name] Primary Care Frederico Gerling: [name] Other Clinician: Referring Brannon Decaire: Treating Lebert Lovern/Extender: Tommy Rainwater in Treatment: 105 Vital Signs Height(in): 7 Pulse(bpm): 74 Weight(lbs): 185 Blood Pressure(mmHg): 122/73 Body Mass Index(BMI): 33 Temperature(F): 97.9 Respiratory Rate(breaths/min): 18 Photos: [N/A:N/A] Left Ischial Tuberosity N/A N/A Wound Location: Pressure Injury N/A N/A Wounding Event: Pressure Ulcer N/A N/A Primary Etiology: Anemia, Hypertension, Type II N/A N/A Comorbid History: Diabetes,  Osteoarthritis, Dementia, Paraplegia, Received Radiation 09/06/2018 N/A N/A Date Acquired: 105 N/A N/A Weeks of Treatment: Open N/A N/A Wound Status: 2x1.2x1 N/A N/A Measurements L x W x D (cm) 1.885 N/A N/A A (cm) : rea 1.885 N/A N/A Volume (cm) : -698.70% N/A N/A % Reduction in A rea: -789.20% N/A N/A % Reduction in Volume: Category/Stage IV N/A N/A Classification: Medium N/A N/A Exudate A mount: Purulent N/A N/A Exudate Type: yellow, brown, green N/A N/A  Exudate Color: Well defined, not attached N/A N/A Wound Margin: Large (67-100%) N/A N/A Granulation A mount: Red, Pink N/A N/A Granulation Quality: Small (1-33%) N/A N/A Necrotic A mount: Fat Layer (Subcutaneous Tissue): Yes N/A N/A Exposed Structures: Fascia: No Tendon: No Muscle: No Joint: No Bone: No Small (1-33%) N/A N/A Epithelialization: Treatment Notes Electronic Signature(s) Signed: 01/08/2021 5:26:57 PM By: Rhae Hammock RN Signed: 01/09/2021 10:14:24 AM By: Linton Ham MD Entered By: Linton Ham on 01/08/2021 10:54:19 -------------------------------------------------------------------------------- Multi-Disciplinary Care Plan Details Patient Name: Date of Service: [name], [name] Posey M. 01/08/2021 10:00 A M Medical Record Number: [id number] Patient Account Number: [id number] Date of Birth/Sex: Treating RN: [birth year] (70 y.o. Sue Lush Primary Care Leesa Leifheit: Other Clinician: [name] Referring Ivin Rosenbloom: Treating Gerald Kuehl/Extender: Tommy Rainwater in Treatment: 105 Active Inactive Wound/Skin Impairment Nursing Diagnoses: Knowledge deficit related to ulceration/compromised skin integrity Goals: Patient/caregiver will verbalize understanding of skin care regimen Date Initiated: 12/30/2018 Target Resolution Date: 01/29/2021 Goal Status: Active Interventions: Assess patient/caregiver ability to obtain necessary supplies Assess patient/caregiver ability to  perform ulcer/skin care regimen upon admission and as needed Assess ulceration(s) every visit Provide education on ulcer and skin care Treatment Activities: Skin care regimen initiated : 12/30/2018 Topical wound management initiated : 12/30/2018 Notes: 01/08/21: Wound care regimen continues Electronic Signature(s) Signed: 01/08/2021 5:11:46 PM By: Lorrin Jackson Entered By: Lorrin Jackson on 01/08/2021 10:09:31 -------------------------------------------------------------------------------- Pain Assessment Details Patient Name: Date of Service: [name]. 01/08/2021 10:00 [place] Record Number: [id number] Patient Account Number: [id number] Date of Birth/Sex: Treating RN: Aug 15, 1950 (70 y.o. Sue Lush Primary Care Rhonda Vangieson: [name] Other Clinician: Referring Kristan Votta: Treating Nayelis Bonito/Extender: Tommy Rainwater in Treatment: 105 Active Problems Location of Pain Severity and Description of Pain Patient Has Paino No Site Locations Pain Management and Medication Current Pain Management: Electronic Signature(s) Signed: 01/08/2021 5:11:46 PM By: Lorrin Jackson Entered By: Lorrin Jackson on 01/08/2021 10:17:09 -------------------------------------------------------------------------------- Patient/Caregiver Education Details Patient Name: Date of Service: [name] 10/4/2022andnbsp10:00 A M Medical Record Number: [id number] Patient Account Number: [id number] Date of Birth/Gender: Treating RN: 1950/11/29 (70 y.o. Sue Lush Primary Care Physician: [name] Other Clinician: Referring Physician: Treating Physician/Extender: Tommy Rainwater in Treatment: 105 Education Assessment Education Provided To: Patient and Caregiver Education Topics Provided Infection: Methods: Explain/Verbal Responses: State content correctly Pressure: Methods: Explain/Verbal, Printed Responses: State content correctly Wound/Skin  Impairment: Methods: Explain/Verbal, Printed Responses: State content correctly Electronic Signature(s) Signed: 01/08/2021 5:11:46 PM By: Lorrin Jackson Entered By: Lorrin Jackson on 01/08/2021 10:12:41 -------------------------------------------------------------------------------- Wound Assessment Details Patient Name: Date of Service: [name]. 01/08/2021 10:00 A M Medical Record Number: [id number] Patient Account Number: [id number] Date of Birth/Sex: Treating RN: 1951/02/22 (70 y.o. Sue Lush Primary Care Kaiven Vester: [name] Other Clinician: Referring Jaymien Landin: Treating Tayla Panozzo/Extender: Tommy Rainwater in Treatment: 105 Wound Status Wound Number: 12 Primary Pressure Ulcer Etiology: Wound Location: Left Ischial Tuberosity Wound Open Wounding Event: Pressure Injury Status: Date Acquired: 09/06/2018 Comorbid Anemia, Hypertension, Type II Diabetes, Osteoarthritis, Weeks Of Treatment: 105 History: Dementia, Paraplegia, Received Radiation Clustered Wound: No Photos Wound Measurements Length: (cm) 2 Width: (cm) 1.2 Depth: (cm) 1 Area: (cm) 1.885 Volume: (cm) 1.885 % Reduction in Area: -698.7% % Reduction in Volume: -789.2% Epithelialization: Small (1-33%) Tunneling: No Undermining: No Wound Description Classification: Category/Stage IV Wound Margin: Well defined, not attached Exudate Amount: Medium Exudate Type: Purulent Exudate Color: yellow, brown, green Foul Odor After  Cleansing: No Slough/Fibrino Yes Wound Bed Granulation Amount: Large (67-100%) Exposed Structure Granulation Quality: Red, Pink Fascia Exposed: No Necrotic Amount: Small (1-33%) Fat Layer (Subcutaneous Tissue) Exposed: Yes Necrotic Quality: Adherent Slough Tendon Exposed: No Muscle Exposed: No Joint Exposed: No Bone Exposed: No Treatment Notes Wound #12 (Ischial Tuberosity) Wound Laterality: Left Cleanser Soap and Water Discharge Instruction: May shower  and wash wound with dial antibacterial soap and water prior to dressing change. Wound Cleanser Discharge Instruction: Cleanse the wound with wound cleanser prior to applying a clean dressing using gauze sponges, not tissue or cotton balls. Peri-Wound Care Skin Prep Discharge Instruction: Use skin prep as directed Topical Primary Dressing Hydrofera Blue Ready Foam, 2.5 x2.5 in Discharge Instruction: cut to fit into wound Secondary Dressing ComfortFoam Border, 4x4 in (silicone border) Discharge Instruction: Apply over primary dressing as directed. Secured With Compression Wrap Compression Stockings Environmental education officer) Signed: 01/08/2021 5:11:46 PM By: Lorrin Jackson Entered By: Lorrin Jackson on 01/08/2021 10:24:37 -------------------------------------------------------------------------------- Vitals Details Patient Name: Date of Service: [name], [name] Breach. 01/08/2021 10:00 A M Medical Record Number: [id number] Patient Account Number: [id number] Date of Birth/Sex: Treating RN: 1950-08-01 (70 y.o. Sue Lush Primary Care Keylee Shrestha: [name] Other Clinician: Referring Sean Malinowski: Treating Dennisse Swader/Extender: Tommy Rainwater in Treatment: 105 Vital Signs Time Taken: 10:15 Temperature (F): 97.9 Height (in): 63 Pulse (bpm): 73 Weight (lbs): 185 Respiratory Rate (breaths/min): 18 Body Mass Index (BMI): 32.8 Blood Pressure (mmHg): 122/73 Reference Range: 80 - 120 mg / dl Electronic Signature(s) Signed: 01/08/2021 5:11:46 PM By: Lorrin Jackson Entered By: Lorrin Jackson on 01/08/2021 10:17:03

## 2021-02-05 ENCOUNTER — Encounter (HOSPITAL_BASED_OUTPATIENT_CLINIC_OR_DEPARTMENT_OTHER): Payer: Medicare Other | Attending: Internal Medicine | Admitting: Internal Medicine

## 2021-02-05 ENCOUNTER — Other Ambulatory Visit: Payer: Self-pay

## 2021-02-05 DIAGNOSIS — E1151 Type 2 diabetes mellitus with diabetic peripheral angiopathy without gangrene: Secondary | ICD-10-CM | POA: Insufficient documentation

## 2021-02-05 DIAGNOSIS — I10 Essential (primary) hypertension: Secondary | ICD-10-CM | POA: Diagnosis not present

## 2021-02-05 DIAGNOSIS — G8220 Paraplegia, unspecified: Secondary | ICD-10-CM | POA: Insufficient documentation

## 2021-02-05 DIAGNOSIS — M8668 Other chronic osteomyelitis, other site: Secondary | ICD-10-CM | POA: Insufficient documentation

## 2021-02-05 DIAGNOSIS — F0390 Unspecified dementia without behavioral disturbance: Secondary | ICD-10-CM | POA: Diagnosis not present

## 2021-02-05 DIAGNOSIS — L89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, stage 4: Secondary | ICD-10-CM | POA: Insufficient documentation

## 2021-02-05 DIAGNOSIS — M1990 Unspecified osteoarthritis, unspecified site: Secondary | ICD-10-CM | POA: Insufficient documentation

## 2021-02-05 DIAGNOSIS — E11622 Type 2 diabetes mellitus with other skin ulcer: Secondary | ICD-10-CM | POA: Insufficient documentation

## 2021-02-05 NOTE — Progress Notes (Signed)
[name], [name] ([id number]) Visit Report for 02/05/2021 Arrival Information Details Patient Name: Date of Service: [name], [name]. 02/05/2021 10:00 A M Medical Record Number: [id number] Patient Account Number: [id number] Date of Birth/Sex: Treating RN: 03-Nov-1950 (70 y.o. [name], [name] Primary Care [name]: [name] Other Clinician: Referring [name]: Treating [name]/Extender: [name] in Treatment: 109 Visit Information History Since Last Visit Added or deleted any medications: No Patient Arrived: Wheel Chair Any new allergies or adverse reactions: No Arrival Time: 10:10 Had a fall or experienced change in No Accompanied By: husband activities of daily living that may affect Transfer Assistance: EasyPivot Patient Lift risk of falls: Patient Identification Verified: Yes Signs or symptoms of abuse/neglect since last visito No Secondary Verification Process Completed: Yes Hospitalized since last visit: No Patient Requires Transmission-Based Precautions: No Implantable device outside of the clinic excluding No Patient Has Alerts: No cellular tissue based products placed in the center since last visit: Has Dressing in Place as Prescribed: Yes Pain Present Now: No Electronic Signature(s) Signed: 02/05/2021 10:49:54 AM By: [name] Entered By: [name] on 02/05/2021 10:11:13 -------------------------------------------------------------------------------- Clinic Level of Care Assessment Details Patient Name: Date of Service: [name] 02/05/2021 10:00 A M Medical Record Number: [id number] Patient Account Number: [id number] Date of Birth/Sex: Treating RN: 10-May-1950 (70 y.o. [name] Primary Care Treasure Ingrum: [name] Other Clinician: Referring Deshaun Schou: Treating Raykwon Hobbs/Extender: [name] in Treatment: 109 Clinic Level of Care Assessment Items TOOL 4 Quantity Score X- 1 0 Use when  only an EandM is performed on FOLLOW-UP visit ASSESSMENTS - Nursing Assessment / Reassessment X- 1 10 Reassessment of Co-morbidities (includes updates in patient status) X- 1 5 Reassessment of Adherence to Treatment Plan ASSESSMENTS - Wound and Skin A ssessment / Reassessment X - Simple Wound Assessment / Reassessment - one wound 1 5 []  - 0 Complex Wound Assessment / Reassessment - multiple wounds []  - 0 Dermatologic / Skin Assessment (not related to wound area) ASSESSMENTS - Focused Assessment []  - 0 Circumferential Edema Measurements - multi extremities []  - 0 Nutritional Assessment / Counseling / Intervention []  - 0 Lower Extremity Assessment (monofilament, tuning fork, pulses) []  - 0 Peripheral Arterial Disease Assessment (using hand held doppler) ASSESSMENTS - Ostomy and/or Continence Assessment and Care []  - 0 Incontinence Assessment and Management []  - 0 Ostomy Care Assessment and Management (repouching, etc.) PROCESS - Coordination of Care []  - 0 Simple Patient / Family Education for ongoing care X- 1 20 Complex (extensive) Patient / Family Education for ongoing care X- 1 10 Staff obtains Programmer, systems, Records, T Results / Process Orders est []  - 0 Staff telephones HHA, Nursing Homes / Clarify orders / etc []  - 0 Routine Transfer to another Facility (non-emergent condition) []  - 0 Routine Hospital Admission (non-emergent condition) []  - 0 New Admissions / Biomedical engineer / Ordering NPWT Apligraf, etc. , []  - 0 Emergency Hospital Admission (emergent condition) []  - 0 Simple Discharge Coordination []  - 0 Complex (extensive) Discharge Coordination PROCESS - Special Needs []  - 0 Pediatric / Minor Patient Management []  - 0 Isolation Patient Management []  - 0 Hearing / Language / Visual special needs []  - 0 Assessment of Community assistance (transportation, D/C planning, etc.) []  - 0 Additional assistance / Altered mentation []  - 0 Support  Surface(s) Assessment (bed, cushion, seat, etc.) INTERVENTIONS - Wound Cleansing / Measurement X - Simple Wound Cleansing - one wound 1 5 []  - 0 Complex Wound  Cleansing - multiple wounds X- 1 5 Wound Imaging (photographs - any number of wounds) []  - 0 Wound Tracing (instead of photographs) X- 1 5 Simple Wound Measurement - one wound []  - 0 Complex Wound Measurement - multiple wounds INTERVENTIONS - Wound Dressings []  - 0 Small Wound Dressing one or multiple wounds X- 1 15 Medium Wound Dressing one or multiple wounds []  - 0 Large Wound Dressing one or multiple wounds []  - 0 Application of Medications - topical []  - 0 Application of Medications - injection INTERVENTIONS - Miscellaneous []  - 0 External ear exam []  - 0 Specimen Collection (cultures, biopsies, blood, body fluids, etc.) []  - 0 Specimen(s) / Culture(s) sent or taken to Lab for analysis []  - 0 Patient Transfer (multiple staff / Civil Service fast streamer / Similar devices) []  - 0 Simple Staple / Suture removal (25 or less) []  - 0 Complex Staple / Suture removal (26 or more) []  - 0 Hypo / Hyperglycemic Management (close monitor of Blood Glucose) []  - 0 Ankle / Brachial Index (ABI) - do not check if billed separately X- 1 5 Vital Signs Has the patient been seen at the hospital within the last three years: Yes Total Score: 85 Level Of Care: New/Established - Level 3 Electronic Signature(s) Signed: 02/05/2021 5:02:43 PM By: Lorrin Jackson Entered By: Lorrin Jackson on 02/05/2021 11:01:24 -------------------------------------------------------------------------------- Encounter Discharge Information Details Patient Name: Date of Service: [name] Pulse M. 02/05/2021 10:00 A M Medical Record Number: [id number] Patient Account Number: [id number] Date of Birth/Sex: Treating RN: [birth year] (70 y.o. [name] Primary Care Christian Treadway: [name] Other Clinician: Referring Valrie Jia: Treating [name]/Extender: [name] in Treatment: 109 Encounter Discharge Information Items Discharge Condition: Stable Ambulatory Status: Wheelchair Discharge Destination: Home Transportation: Private Auto Accompanied ByRaechel Chute Schedule Follow-up Appointment: Yes Clinical Summary of Care: Provided on 02/05/2021 Form Type Recipient Paper Patient Patient Electronic Signature(s) Signed: 02/05/2021 11:32:58 AM By: Lorrin Jackson Entered By: Lorrin Jackson on 02/05/2021 11:32:57 -------------------------------------------------------------------------------- Lower Extremity Assessment Details Patient Name: Date of Service: [name]. 02/05/2021 10:00 A M Medical Record Number: [id number] Patient Account Number: [id number] Date of Birth/Sex: Treating RN: Aug 06, 1950 (70 y.o. [name] Primary Care Jacen Carlini: [name] Other Clinician: Referring Gust Eugene: Treating Ahmaya Ostermiller/Extender: [name] in Treatment: 109 Electronic Signature(s) Signed: 02/05/2021 5:02:43 PM By: Lorrin Jackson Entered By: Lorrin Jackson on 02/05/2021 10:43:34 -------------------------------------------------------------------------------- Multi Wound Chart Details Patient Name: Date of Service: [name], [name] Breach. 02/05/2021 10:00 A M Medical Record Number: [id number] Patient Account Number: [id number] Date of Birth/Sex: Treating RN: September 06, 1950 (70 y.o. [name], [name] Primary Care Aamirah Salmi: [name] Other Clinician: Referring Daisia Slomski: Treating Averlee Swartz/Extender: [name] in Treatment: 109 Vital Signs Height(in): 63 Pulse(bpm): 30 Weight(lbs): 185 Blood Pressure(mmHg): 113/69 Body Mass Index(BMI): 33 Temperature(F): 97.8 Respiratory Rate(breaths/min): 18 Photos: [N/A:N/A] Left Ischial Tuberosity N/A N/A Wound Location: Pressure Injury N/A N/A Wounding Event: Pressure Ulcer N/A N/A Primary Etiology: Anemia, Hypertension, Type II N/A  N/A Comorbid History: Diabetes, Osteoarthritis, Dementia, Paraplegia, Received Radiation 09/06/2018 N/A N/A Date Acquired: 109 N/A N/A Weeks of Treatment: Open N/A N/A Wound Status: 2.1x0.9x1.2 N/A N/A Measurements L x W x D (cm) 1.484 N/A N/A A (cm) : rea 1.781 N/A N/A Volume (cm) : -528.80% N/A N/A % Reduction in A rea: -740.10% N/A N/A % Reduction in Volume: 6 Starting Position 1 (o'clock): 12 Ending Position 1 (o'clock): 0.8 Maximum Distance 1 (cm): Yes N/A N/A Undermining:  Category/Stage IV N/A N/A Classification: Medium N/A N/A Exudate A mount: Purulent N/A N/A Exudate Type: yellow, brown, green N/A N/A Exudate Color: Well defined, not attached N/A N/A Wound Margin: Large (67-100%) N/A N/A Granulation A mount: Red, Pink N/A N/A Granulation Quality: None Present (0%) N/A N/A Necrotic A mount: Fat Layer (Subcutaneous Tissue): Yes N/A N/A Exposed Structures: Fascia: No Tendon: No Muscle: No Joint: No Bone: No Small (1-33%) N/A N/A Epithelialization: Treatment Notes Electronic Signature(s) Signed: 02/05/2021 4:29:54 PM By: Linton Ham MD Signed: 02/05/2021 4:48:23 PM By: Rhae Hammock RN Entered By: Linton Ham on 02/05/2021 11:01:22 -------------------------------------------------------------------------------- Multi-Disciplinary Care Plan Details Patient Name: Date of Service: [name], [name] Posey M. 02/05/2021 10:00 A M Medical Record Number: [id number] Patient Account Number: [id number] Date of Birth/Sex: Treating RN: 03-16-1951 (70 y.o. [name] Primary Care Jazlyn Tippens: [name] Other Clinician: Referring Para Cossey: Treating Temisha Murley/Extender: [name] in Treatment: 109 Active Inactive Wound/Skin Impairment Nursing Diagnoses: Knowledge deficit related to ulceration/compromised skin integrity Goals: Patient/caregiver will verbalize understanding of skin care regimen Date Initiated:  12/30/2018 Target Resolution Date: 03/05/2021 Goal Status: Active Interventions: Assess patient/caregiver ability to obtain necessary supplies Assess patient/caregiver ability to perform ulcer/skin care regimen upon admission and as needed Assess ulceration(s) every visit Provide education on ulcer and skin care Treatment Activities: Skin care regimen initiated : 12/30/2018 Topical wound management initiated : 12/30/2018 Notes: 01/08/21: Wound care regimen continues Electronic Signature(s) Signed: 02/05/2021 10:52:12 AM By: Lorrin Jackson Entered By: Lorrin Jackson on 02/05/2021 10:52:11 -------------------------------------------------------------------------------- Pain Assessment Details Patient Name: Date of Service: [name]. 02/05/2021 10:00 A M Medical Record Number: [id number] Patient Account Number: [id number] Date of Birth/Sex: Treating RN: [birth year] (70 y.o. [name], [name] Primary Care Vance Belcourt: [name] Other Clinician: Referring Ruqayyah Lute: Treating Cristela Stalder/Extender: [name] in Treatment: 109 Active Problems Location of Pain Severity and Description of Pain Patient Has Paino No Site Locations Pain Management and Medication Current Pain Management: Electronic Signature(s) Signed: 02/05/2021 10:49:54 AM By: [name] Signed: 02/05/2021 4:48:23 PM By: Rhae Hammock RN Entered By: [name] on 02/05/2021 10:11:52 -------------------------------------------------------------------------------- Patient/Caregiver Education Details Patient Name: Date of Service: [name] 11/1/2022andnbsp10:00 [place] Record Number: [id number] Patient Account Number: [id number] Date of Birth/Gender: Treating RN: 1951/01/11 (70 y.o. [name] Primary Care Physician: [name] Other Clinician: Referring Physician: Treating Physician/Extender: [name] in Treatment: 109 Education  Assessment Education Provided To: Patient Education Topics Provided Pressure: Methods: Explain/Verbal, Printed Responses: State content correctly Wound/Skin Impairment: Methods: Explain/Verbal, Printed Responses: State content correctly Electronic Signature(s) Signed: 02/05/2021 5:02:43 PM By: Lorrin Jackson Entered By: Lorrin Jackson on 02/05/2021 10:53:19 -------------------------------------------------------------------------------- Wound Assessment Details Patient Name: Date of Service: [name]. 02/05/2021 10:00 A M Medical Record Number: [id number] Patient Account Number: [id number] Date of Birth/Sex: Treating RN: 05/31/1950 (70 y.o. [name] Primary Care Dallyn Bergland: [name] Other Clinician: Referring Arisbeth Purrington: Treating Jackie Littlejohn/Extender: [name] in Treatment: 109 Wound Status Wound Number: 12 Primary Pressure Ulcer Etiology: Wound Location: Left Ischial Tuberosity Wound Open Wounding Event: Pressure Injury Status: Date Acquired: 09/06/2018 Comorbid Anemia, Hypertension, Type II Diabetes, Osteoarthritis, Weeks Of Treatment: 109 History: Dementia, Paraplegia, Received Radiation Clustered Wound: No Photos Wound Measurements Length: (cm) 2.1 Width: (cm) 0.9 Depth: (cm) 1.2 Area: (cm) 1.484 Volume: (cm) 1.781 % Reduction in Area: -528.8% % Reduction in Volume: -740.1% Epithelialization: Small (1-33%) Undermining: Yes Starting Position (o'clock): 6 Ending Position (  o'clock): 12 Maximum Distance: (cm) 0.8 Wound Description Classification: Category/Stage IV Wound Margin: Well defined, not attached Exudate Amount: Medium Exudate Type: Purulent Exudate Color: yellow, brown, green Foul Odor After Cleansing: No Slough/Fibrino No Wound Bed Granulation Amount: Large (67-100%) Exposed Structure Granulation Quality: Red, Pink Fascia Exposed: No Necrotic Amount: None Present (0%) Fat Layer (Subcutaneous Tissue) Exposed:  Yes Tendon Exposed: No Muscle Exposed: No Joint Exposed: No Bone Exposed: No Treatment Notes Wound #12 (Ischial Tuberosity) Wound Laterality: Left Cleanser Soap and Water Discharge Instruction: May shower and wash wound with dial antibacterial soap and water prior to dressing change. Wound Cleanser Discharge Instruction: Cleanse the wound with wound cleanser prior to applying a clean dressing using gauze sponges, not tissue or cotton balls. Peri-Wound Care Skin Prep Discharge Instruction: Use skin prep as directed Topical Primary Dressing Hydrofera Blue Ready Foam, 2.5 x2.5 in Discharge Instruction: cut to fit into wound Secondary Dressing ComfortFoam Border, 4x4 in (silicone border) Discharge Instruction: Apply over primary dressing as directed. Secured With Compression Wrap Compression Stockings Environmental education officer) Signed: 02/05/2021 5:02:43 PM By: Lorrin Jackson Entered By: Lorrin Jackson on 02/05/2021 10:43:10 -------------------------------------------------------------------------------- Vitals Details Patient Name: Date of Service: [name], [name] Breach. 02/05/2021 10:00 A M Medical Record Number: [id number] Patient Account Number: [id number] Date of Birth/Sex: Treating RN: 17-Feb-1951 (70 y.o. [name], [name] Primary Care Savannaha Stonerock: [name] Other Clinician: Referring Quinto Tippy: Treating Tredarius Cobern/Extender: [name] in Treatment: 109 Vital Signs Time Taken: 10:11 Temperature (F): 97.8 Height (in): 63 Pulse (bpm): 81 Weight (lbs): 185 Respiratory Rate (breaths/min): 18 Body Mass Index (BMI): 32.8 Blood Pressure (mmHg): 113/69 Reference Range: 80 - 120 mg / dl Electronic Signature(s) Signed: 02/05/2021 10:49:54 AM By: [name] Entered By: [name] on 02/05/2021 10:11:43

## 2021-02-05 NOTE — Progress Notes (Signed)
EVI, MCCOMB ([id number]) Visit Report for 02/05/2021 HPI Details Patient Name: Date of Service: [name], [name]. 02/05/2021 10:00 A M Medical Record Number: [id number] Patient Account Number: [id number] Date of Birth/Sex: Treating RN: 09-Jan-1951 (70 y.o. [name], [name] Primary Care Provider: [name] Other Clinician: Referring Provider: Treating Provider/Extender: Tommy Rainwater in Treatment: 109 History of Present Illness Location: open ulceration of the left gluteal area, left heel and right ankle for about 5 months. Quality: Patient reports No Pain. Severity: Patient states wound(s) are getting worse. Duration: Patient has had the wound for > 5 months prior to seeking treatment at the wound center Context: The wound occurred when the patient has been paraplegic for about 3 years. Modifying Factors: Wound improving due to current treatment. ssociated Signs and Symptoms: Patient reports having foul odor. A HPI Description: this 70 year old patient who is known to have hypertension, hypothyroidism, breast cancer, chronic pain syndrome, paraplegia was noted to have a left gluteal decubitus ulcer and was brought into the hospital. During the course of her hospitalization she was debrided in the operating room by ankle wound. Bone cultures were taken at that time but were negative but clinically she was treated for osteomyelitis because of the probing down to bone and open exposed bone. Home health has been giving her antibioticss which include vancomycin and Zosyn. The patient was a smoker until about 3 weeks ago and used to smoke about 10 cigarettes a day for a long while. 12/13/2014 - details of her operative note from 11/03/2014 were reviewed -- PROCEDURE: 1. Excisional debridement skin, subcutaneous, muscle left ischium 35 cm2 2. Excisional debridement skin, subcutaneous tissue left heel 27 cm2 3. Excisional debridement right ankle skin, subcutaneous, bone  30 cm2 01/24/2015 -- she has some issues with her wheelchair cushion but other than that is doing very well and has received Podus boots for her feet. 02/14/2015 -- she was using her old offloading boots and this seemed to have caused her a new pressure ulcer on the left posterior heel near the superior part just below the Achilles tendon. 03/07/2015 -- she has a new ulceration just to the left of the midline on her sacral region more on the left buttock and this has been there for Dr. Leland Johns and had all the wounds sharply debrided. The debridement was done for the left ischial wound, the left heel wound and the right about a week. 08/22/2015 -- was recently admitted to hospital between May 5 and 08/13/2015, with sepsis and leukocytosis due to a UTI. she was treated for a sepsis complicating Escherichia coli UTI and kidney stones. She also had metabolic and careful up at the secondary to pyelonephritis. He received broad-spectrum antibiotics initially and then received Macrobid as per urology. She was sent home on nitrofurantoin. during her admission she had a CT scan which showed exposed left ischial tuberosity without evidence of osteolysis. 09/12/2015-- the patient is having some issues with her air mattress and would like to get a opinion from medical modalities. 10/10/2015 -- the issue with her air mattress has not yet been sorted out and the new problem seems to be a lot of odor from the wound VAC. 11/27/2015 -- the patient was admitted to the hospital between July 23 and 10/31/2015. Her problems were sepsis, osteomyelitis of the pelvic bone and acute pyelonephritis. CT of the abdomen and pelvis was consistent with a left-sided pyelonephritis with hydronephrosis and also just showed new sclerosis of the posterior portion of  the left anterior pubic ramus suggestive of periosteal reaction consistent with osteomyelitis. She was treated for the osteomyelitis with infectious disease consult  recommending 6 weeks of IV antibiotics including vancomycin and Rocephin and the antibiotics were to go on until 12/10/2015. He was seen by Dr. Iran Planas plastic surgery and Dr. Linus Salmons of infectious disease. She had a suprapubic catheter placed during the admission. CT scan done on 10/28/2015 showed specifically -- New sclerosis of the posterior portion of the left inferior pubic ramus with aggressive periosteal reaction, consistent with osteomyelitis, with adjacent soft tissue gas compatible with previously described decubitus ulcer. 12/12/2015 -- she was recently seen by Dr. Linus Salmons, who noted good improvement and CRP and ESR compared to before and he has stopped her antibiotic as per plans to finish on September 4. The patient was encouraged to continue with wound care and consider hyperbaric oxygen therapy. Today she tells me that she has consented to undergo hyperbaric oxygen therapy and we can start the paperwork. 01/02/2016 -- her PCP had gained about 3 years but she still persists in having problems during hyperbaric oxygen therapy with some discomfort in the ears. 01/09/16; pressure area with underlying osteomyelitis in the left buttock. Wound bed itself has some slight amount of grayish surface slough however I do not think any debridement was necessary. There is no exposed bone soft tissue appears stable. She is using a wound VAC 01/16/16; back for weekly wound review in conjunction with HBO. She has a deep wound over the left initial tuberosity previously treated with 6 weeks of IV antibiotics for osteomyelitis. Wound bed looks reasonably healthy although the base of this is still precariously close to bone. She has been using a wound VAC. 01/23/2016 -- she has completed her course of antibiotics and this week the only new thing is her right great toe nail was avulsed and she has got an open wound over the nailbed. 01/31/16 she has completed her course of antibiotics. Her right great toenail  avulsed last week and she's been using silver alginate for this as well. 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 Did not sound like there was any optimistic discussion regarding reconstruction and the net result would be to continue with the wound VAC application. I will await the official reports. Addendum: -- she was seen at [facility] surgery [facility] by Dr. Tressa Busman. After a thorough review and from what I understand spending 45 minutes with the patient his assessment has been noted by me in detail and the management options were: 1. Continued pressure offloading and wound care versus operative procedures including wound excision 2. Soft tissue and bone sampling 3. If the wound gets larger wound closure would be done using a variety of plastic surgical techniques including but not limited to skin substitute, possible skin graft, local versus regional flaps, negative pressure dressing application. 4. He discussed with her details of flap surgery and the risks associated 5. He made a comment that since the patient was operated on by Dr. Leland Johns of [facility] plastic surgery unit in [place] the patient may continue to follow-up there for further evaluation for surgical flap closure in the future. 03/19/2016 -- the patient continues to be rather depressed and frustrated  with her lack of rapid progress in healing this wound especially because she thought after hyperbaric oxygen therapy the wound would heal extremely fast. She now understands that was not the implied benefit on wound care which was the recommendation for hyperbaric oxygen therapy. I have had a lengthy discussion with the patient and her husband regarding her  options: 1. Continue with collagen and wound VAC for the primary dressing and offloading and all supportive care. 2. See Dr. Iran Planas for possible placement of Acell or Integra in the OR. 3. get a second opinion from a wound care center and surrounding regions/counties 05/07/2016 -- Note from Dr. Celedonio Miyamoto, who noted that the patient has declined flap surgery. She has discussed application of A cell, and try a few applications to see how the wound progresses. She is also recommended that we could apply products here in the wound center, like Oasis. during her preop workup it was found that her hemoglobin A1c was 11% and she has now been diagnosed as having diabetes mellitus and has been put on appropriate treatment by her PCP 05/28/2016 -- tells me her blood sugars have been doing well and she has an appointment to see her PCP in the next couple of weeks to check her hemoglobin A1c. Other than that she continues to do well. 06/25/2016 -- have not seen her back for the last month but she says her health has been about the same and she has an appointment to check the A1c next week 09/10/16 ---- was seen by Dr. Celedonio Miyamoto -- who applied Acell and saw her back in follow-up. She has recommended silver alginate to the wound every other day and cover with foam. If no significant drainage could transition to collagen every other day. She recommended discontinuing wound VAC. There were no plans to repeat application of Acell. The patient expressed that her husband could do the wound care as going to the Wound Ctr., would cost several $100 for each visit. 10/21/2016 -- her insurance company is getting her new mattress and she is pleased about that. Other than that she has been doing dressings with PolyMem Silver and has been doing very well 02/18/2017 -- she has gone through several changes of her mattress and has not been pleased with any of them. The ventricles are still working on trying to  fit her with the appropriate low air-loss mattress. She has a new wound on the gluteal area which is clearly separated from the original wound. 03/25/17-she is here in follow-up evaluation for her left ischialpressure ulcer. She remains unsatisfied with her pressure mattress. She admits to sitting multiple hours a day, in the bed. We have discussed offloading options. The wound does not appear infected. Nutrition does not appear to be a concern. Will follow-up in 4 weeks, if wound continues to be stalled may consider x-ray to evaluate for refractory osteomyelitis. 04/21/17; this is a patient that I don't know all that well. She has a chronic wound which at one point had underlying osteomyelitis in the left ischial tuberosity. This is a stage IV pressure ulcer. Over the last 3 months she has a stage II wound inferiorly to the original wound. The last time she was here her dressing was changed to silver collagen although the patient's husband who changes the dressing said that the collagen stuck to the wound and remove skin from the superficial area therefore he switched back to Anderson 05/13/17; this is a patient we've been following for a left  ischial tuberosity wound which was stage IV at one point had underlying osteomyelitis. Over the last several months she's had a stage II wound just inferior and medial to the related to the wound. According to her husband he is using Endoform layer with collagen although this is not what I had last time. According to her husband they are using Elgie Congo with collagen although I don't quite know how that started. She was hospitalized from 1/20 through 04/30/16. This was related to a UTI. Her blood cultures were negative, urine culture showed multiple species. She did have a CT scan of the abdomen and pelvis which documented chronic osteomyelitis in the area of the wound inflammatory markers were unremarkable. She has had prior knowledge of osteomyelitis. It  looks as though she received IV antibiotics in 2017 and was treated with a course of hyperbaric oxygen. 05/28/17; the wound over the left ischial tuberosity is deeper today and abuts clearly on bone. Nursing intake reported drainage. I therefore culture of the wound. The more superficial area just below this looks about the same. They once again complained that there are mattress cover is not working although apparently [facility] is been noted to see this many times in the report is that the device is functional 06/18/17; the patient had a probing area on the left ischial tuberosity that was draining purulent fluid last time. This also clearly seemed to have open bone. Culture I did showed pansensitive pseudomonas including third generation cephalosporins. I treated this with cefdinir 300 twice a day for 10 days and things seem to have improved. She has a more superficial wound just underneath this area. Amazingly she has a new air mattress through [facility]. I think they gave this to her as a parking give. In any case this now works according to the patient may have something to do with why the areas are looking better. 07/09/17; the patient has a probing area in the left ischial tuberosity that still has some depth. However this is contracted in terms of the wound orifice although the depth is still roughly the same. There is no undermining. She also has the satellite wound which is more superficial. This appears to have a healthy surface we've been using silver collagen 08/06/17; the patient's wound is over the left ischial tuberosity and a satellite lesion just underneath this. The original wound was actually a deep stage 4 wound. We have made good progress in 2 months and there is no longer exposed bone here. 09/03/17; left ischial tuberosity actually appears to be quite healthy. I think we are making progress. No debridement is required. There is no surrounding erythema 10/01/17 I  follow this patient monthly for her left ischial tuberosity wound. There is 2 areas the original area and a satellite area. The satellite area looks a lot better there is no surrounding erythema. Her husband relates that he is having trouble maintaining the dressing. This has to do with the soft tissue around it. He states he puts the collagen in but he cannot make sure that it stays in even with the ABD pads and tape that he is been using 10/29/17; patient arrives with a better looking noon today. Some of the satellite lesions have closed. using Prisma 11/26/17; the patient has a large cone-shaped area with the tip of the Cone deep within her buttock soft tissue. The walls of the Cone are epithelialized however the base is still open. The area at the base of this looks  moist we've been using silver collagen. Will change to silver alginate 12/31/2017; the wound appears to have come in fairly nicely. Using silver alginate. There is no surrounding maceration or infection 01/28/18; there is still an open area here over the left initial tuberosity. Base of this however looks healthy. There is no surrounding infection 02/25/18; the area of its open is over the left ischial tuberosity. The base of this is where the wound is. This is a large inverted cone-shaped area with the wound at the tip. Dimensions of the wound at the tip are improved. There is a area of denuded skin about halfway towards the tip which her husband thinks may have happened today when he was bathing her. 04/20/17; the area is still open over the left initial tuberosity. 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 This is now completely closed over although it is closed over with a divot and skin over bone at the base of this. Continued aggressive  offloading will be necessary. 12/30/2018 READMISSION This is a 70 year old woman with chronic paraplegia. I picked her up for her care from Dr. Con Memos in this clinic after he departed. She had a stage IV pressure wound over the left ischial tuberosity. She was treated twice for her underlying osteomyelitis and this I believe firstly in 2016 and again in 2017. There were some plans at some point for flap closure of this however she was discovered to have uncontrolled diabetes and I do not think this was ever accomplished. She ultimately healed over in this clinic and was discharged in May. She has a large cone-shaped indentation with the tip of this going towards the left ischial tuberosity. It is not an easy area to examine but at that time I thought all of this was epithelialized. Apparently there was a reopening here shortly after she left the clinic last time. She was admitted to hospital at the end of June for Klebsiella bacteremia felt to be secondary to UTI. A CT scan of the pelvis is listed below and there was initially some concern that she had underlying osteomyelitis although I believe she was seen by infectious disease and that was felt to be not the case: I do not see any new cultures or inflammatory markers IMPRESSION: 1. No CT evidence for acute intra-abdominal or pelvic abnormality. Large volume of stool throughout the colon. 2. Enlarged fatty liver with fat sparing near the gallbladder fossa 3. Cortical scarring right kidney. Bilateral intrarenal stones without hydronephrosis. Thick-walled urinary bladder decompressed by suprapubic catheter 4. Deep left decubitus ulcer with underlying left ischial changes suggesting osteomyelitis. Her husband has been using silver collagen in the wound. She has not been systemically unwell no fever chills eating and drinking well. They rigorously offload this wound only getting up in the wheelchair when she is going to appointments the rest of the  time she is in bed. 10/8; wound measures larger and she now has exposed bone. We have been using silver alginate 11/12 still using silver alginate. The patient saw Dr. Megan Salon of infectious disease. She was started on Augmentin 500 mg twice daily. She is due to follow-up with Dr. Megan Salon I believe next week. Lab work Dr. Megan Salon requested showed a sedimentation rate of 28 and CRP of 20 although her CRP 1 year ago  was 18.8. Sedimentation rate 1 year ago was 11 basic metabolic panel showed a creatinine of 1.12 12/3; the patient followed up with Dr. Megan Salon yesterday. She is still on Augmentin twice daily. This was directed by Dr. Megan Salon. The patient's inflammatory markers have improved which is gratifying. Her C-reactive protein was repeated yesterday and follow-up booked with infectious disease in January. In addition I have been getting secure text messages I think from palliative care through [facility] [employer]. I think they were hoping to provide services to the patient in her home. They could not get a hold of the primary physician and so they reached out to me on 2 separate occasions. 12/17; patient last saw Dr. Megan Salon on 12/2. She is finishing up with Augmentin. Her C-reactive protein was 20 on 10/21, 10.1 on 11/19 and 17 on 12/2. The wound itself still has depth and undermining. We are using Santyl with the backing wet-to-dry 04/27/2019. The wound is gradually clearing up in terms of the surface although it is not filled in that much. Still abuts right against bone 2/4; patient with a deep pressure ulcer over the left ischial tuberosity. I thought she was going to follow-up with infectious disease to follow her inflammatory markers although the patient states that they stated that they did not need to see her unless we felt it was necessary. I will need to check their notes. In any case we ordered moistened silver collagen back with wet-to-dry to fill in the depth of  the wound although apparently [facility] sent silver alginate which they have been using since they were here the last time. Is obviously not what we ordered. 2/25. Not much change in this wound it is over the left ischial tuberosity recurrent wound. We have been using silver collagen with backing wet-to-dry. I think the wound is about the same. There is still some tunneling from about 10-12 o'clock over the ischial tuberosity itself 3/11; pressure ulcer over the left ischial tuberosity. Since she was last here the wound VAC was started and apparently going quite well. We are able to get the home health company that accepts [place] healthcare which is in itself sometimes problematic. There is been improvements in the wound the tunneling seems to be better and is contracted nicely 4/8; 1 month follow-up. Since she was last here we have been using silver collagen under a wound VAC. Some minor contraction I think in wound volume. She is cared for diligently by her husband including pressure relief, incontinence management, nutritional support etc. 6/1; this is almost a 26-monthfollow-up. She is been using silver collagen under wound VAC. Circular area over the left ischial tuberosity. She has been using silver collagen under wound VAC 7/8; 1 month follow-up. Silver collagen under the VAC not really a lot of progress. Tissue at the base of the wound which is right against bone and the tissue next that this does not look completely viable. She is not currently on any antibiotics, she had underlying osteomyelitis I need to look this over 8/16; we are using silver collagen under wound VAC to the left ischial tuberosity wound. Comes in today with absolutely no change in surface area or depth. There is no exposed bone. I did look over her infectious disease notes as I said I would do last time. She last saw Dr. CMegan Salonin December 2020. She completed 6 weeks of Augmentin. This was in response to a bone culture I  did showing methicillin susceptible staph aureus  and Enterococcus. She was supposed to come back to see Dr. Megan Salon at some point although they say that that appointment was canceled unless I chose to recommend return. I think there was supposed to be follow-up with inflammatory markers but I cannot see that that was ever done. She has not been on antibiotics since 9/21; monthly follow-up. We received a call from home health nurse last evening to report green drainage coming out of the wound. Lab work I ordered last time showed a white count of 5.2 a sedimentation rate of 45 and a C-reactive protein of 25 however neither one of the 2 values are substantially different from her previous values in October 2020 or December 2020. Both are slightly higher but only marginally. Otherwise no new complaints from the patient or her husband 10/19; 1 month follow-up. PCR culture I did last time showed medium quantities of Pseudomonas lower quantities Klebsiella and Enterococcus faecalis group B strep and Peptostreptococcus. I gave her Augmentin for 2 weeks. I am not really sure of my choice of this I would not cover Pseudomonas. She is still having green drainage. Wound itself looks satisfactory there is not a lot of depth wound bed looks healthy 11/16; patient has completed the antibiotics still using gentamicin and silver alginate on the wound. There is improvement in the surface area 12/21; in general the area on the buttock looks somewhat better. Surface looks healthy although I do not know that there is been much improvement in the wound volume. We have been using silver alginate and Hydrofera Blue. Less drainage. In passing the husband showed me an abrasion injury on the left anterior tibia. Covered in necrotic surface. He has noticed this for about a week and has been putting silver collagen on it. He is completely uncertain about how this happened 1/25; monthly follow-up. The area on the left buttock is  about the same. This does not go to bone but a fairly deep wound surface of the wound is of questionable viability. The abrasion injury that they showed me last time apparently was closed out by home health because they thought it was healed but certainly is not although it is just about healed. As a result they haven't been applying anything to this area Finally I did discuss with the patient and her husband the idea of an advanced treatment product to try and get a proper base to this wound I was thinking of Puraply however actually the patient points out that her co-pay for coming to visit [place] i.e. the facility, charge would be unaffordable if they have to, on a weekly basis 2/22; pressure ulcer on the left buttock appears deeper to me and abuts on the ischial tuberosity. I thought initially there was exposed bone but there is a rim of tissue over this area. She also has a superficial over the right anterior mid tibia. Been using silver collagen to these areas without much success. I have looked over the patient's past history with regards to the area on the left ischium. She did have underlying osteomyelitis here dating back I think to late 2020. She saw Dr. Megan Salon she received a 6-week course of oral antibiotics in response to a bone culture that I did. This does not appear to be infected but it certainly has not been improving in terms of granulation. I do not believe she has had any recent imaging studies 3/29; 1 month follow-up. Pressure ulcer on the left buttock which she has been dealing with with  for a number of years. She was treated for underlying osteomyelitis at 1.2 or 3 years ago I think with infectious disease help. She also had I think a flap closure by Dr. Leland Johns and that lasted for about a year and then reopened. I have not been able to get this patient to progress towards healing although truthfully the wound is absolutely no worse. We have been using Hydrofera Blue 4/26;  patient presents for 1 week follow-up. She has been using silver collagen to the area every other day. She has home health that comes out once a week to help with dressing changes as well. The patient is interested in trying a skin substitute over this area. She states she is trying to relieve pressure off of it most of the day. 5/24; patient presents for 1 month follow-up. She has been using silver collagen to the area every other day. She has no complaints today. She is interested in the skin substitute. She tries to leave relieve pressure off Her bottom however is not able to most of the day 6/14; using silver collagen to the area over the left ischial tuberosity. Wound does not appear to be doing particularly well. Open to bone 6/28; the patient patient presented last time with a marked deterioration. Depth probing all the way to bone. The bone itself did not look particularly viable. In spite of this the x-ray I did showed longstanding ulcer over the left ischial tuberosity with chronic bone involvement/reaction that was also seen by CT in 2020. Lab work did not show just active infection with a sed rate of 14 and a C-reactive protein of 7.1. Her comprehensive metabolic panel was normal including an albumin of 4.1 white count was 6 7/19; patient was here 3 weeks ago with a marked deterioration in her wound over the left ischial tuberosity. I ordered a CT scan of the area for 1 reason or another this just did not get done. It is now booked for 8 days from now. She was supposed to come back for bone culture and pathology. That did not happen either. We have been using silver collagen. As usual she is diligently looked after by her husband 8/24; 5-week follow-up. Since the patient was last here the biopsy that I did of her ischial tuberosity came back suggesting osteomyelitis. Culture showed strep. I started her on Augmentin. She was seen by infectious disease Dr. Lucianne Lei dam and the Augmentin was  continued and she is still taking it. CT scan did not suggest anything other than chronic osteomyelitis without much change from her previous study. Her C-reactive protein was only 7 and sedimentation rate was 1.1. We are still using silver collagen to the wound. She has home health. Her husband is very diligent in her care for this reason I have never pursued a diverting colostomy. She would not agree to this surgery in any case. Finally we have discussed plastic surgery with him in the past and she is not interested in a myocutaneous flap. 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 Since she was last here she was in the ER last week with abdominal pain. She was found to be impacted however in the course of the review there somebody gave her some IV morphine and apparently she developed hives and blisters. There is still tense blisters on her plantar left fifth and fourth fingers 10/4; the patient has completed her Augmentin and is due to follow-up with Dr. Drucilla Schmidt in early November. Her wound  today measures about the same but looks a little healthier in terms of granulation there is no exposed bone. I note that she was admitted to hospital for 2 days from 9/21 through 9/23 for delirium. She had received Versed for glaucoma surgery ultimately that was felt to be the etiology. Her blood cultures were negative she had 30,000 colonies of Pseudomonas in her urine. She did receive broad-spectrum antibiotic therapy but ultimately her wound on the buttock was not felt to be the cause. As mentioned she has completed her antibiotics still using silver collagen 11/1; patient has completed her antibiotics for the underlying osteomyelitis. She apparently follows up with Dr. Drucilla Schmidt next week. The wound does not look too bad perhaps slightly narrower in terms of width but the depth is about the same. We have been using Hydrofera Blue. The patient talks to me about a wound VAC and made it sound as though she was  recently on 1 although I do not see this. The other option is an advanced treatment product like Oasis but that means weekly trips into the clinic. She has previously said she does not want an attempt at plastic surgery Electronic Signature(s) Signed: 02/05/2021 4:29:54 PM By: Linton Ham MD Entered By: Linton Ham on 02/05/2021 11:04:12 -------------------------------------------------------------------------------- Physical Exam Details Patient Name: Date of Service: [name]. 02/05/2021 10:00 [place] Record Number: [id number] Patient Account Number: [id number] Date of Birth/Sex: Treating RN: Oct 24, 1950 (70 y.o. [name], [name] Primary Care Provider: [name] Other Clinician: Referring Provider: Treating Provider/Extender: Tommy Rainwater in Treatment: 109 Constitutional Sitting or standing Blood Pressure is within target range for patient.. Pulse regular and within target range for patient.Marland Kitchen Respirations regular, non-labored and within target range.. Temperature is normal and within the target range for the patient.Marland Kitchen Appears in no distress. Notes Wound exam; and at the base of this about the same as last time there is not much of it over bone but there is no exposed bone either. Slight undermining at 1 end and the other in this oval-shaped wound. Seems to have contracted a bit in terms of width. There is no evidence of surrounding soft tissue infection Electronic Signature(s) Signed: 02/05/2021 4:29:54 PM By: Linton Ham MD Entered By: Linton Ham on 02/05/2021 11:05:27 -------------------------------------------------------------------------------- Physician Orders Details Patient Name: Date of Service: [name]. 02/05/2021 10:00 A M Medical Record Number: [id number] Patient Account Number: [id number] Date of Birth/Sex: Treating RN: Aug 26, 1950 (70 y.o. [name] Primary Care Provider: [name] Other  Clinician: Referring Provider: Treating Provider/Extender: Tommy Rainwater in Treatment: 109 Verbal / Phone Orders: No Diagnosis Coding ICD-10 Coding Code Description [phone] Pressure ulcer of left buttock, stage 4 E11.622 Type 2 diabetes mellitus with other skin ulcer M86.68 Other chronic osteomyelitis, other site Follow-up Appointments ppointment in 2 weeks. - with Dr. Dellia Nims Return A Other: - Has follow up with ID 02/13/21 Cellular or Tissue Based Products Cellular or Tissue Based Product Type: - Will run IVR for Oasis Skin Sub Bathing/ Shower/ Hygiene May shower and wash wound with soap and water. - with dressing changes Edema Control - Lymphedema / SCD / Other Elevate legs to the level of the heart or above for 30 minutes daily and/or when sitting, a frequency of: Avoid standing for long periods of time. Off-Loading Turn and reposition every 2 hours Home Health No change in wound care orders this week; continue Home Health for wound care. May utilize formulary  equivalent dressing for wound treatment orders unless otherwise specified. Other Home Health Orders/Instructions: - [facility] Wound Treatment Wound #12 - Ischial Tuberosity Wound Laterality: Left Cleanser: Soap and Water (Home Health) 1 x Per Day/30 Days Discharge Instructions: May shower and wash wound with dial antibacterial soap and water prior to dressing change. Cleanser: Wound Cleanser (Home Health) 1 x Per Day/30 Days Discharge Instructions: Cleanse the wound with wound cleanser prior to applying a clean dressing using gauze sponges, not tissue or cotton balls. Peri-Wound Care: Skin Prep (Home Health) 1 x Per Day/30 Days Discharge Instructions: Use skin prep as directed Prim Dressing: Hydrofera Blue Ready Foam, 2.5 x2.5 in 1 x Per Day/30 Days ary Discharge Instructions: cut to fit into wound Secondary Dressing: ComfortFoam Border, 4x4 in (silicone border) ([place]) 1 x Per Day/30  Days Discharge Instructions: Apply over primary dressing as directed. Electronic Signature(s) Signed: 02/05/2021 4:29:54 PM By: Linton Ham MD Signed: 02/05/2021 5:02:43 PM By: Lorrin Jackson Entered By: Lorrin Jackson on 02/05/2021 10:59:39 -------------------------------------------------------------------------------- Problem List Details Patient Name: Date of Service: [name], [name]. 02/05/2021 10:00 A M Medical Record Number: [id number] Patient Account Number: [id number] Date of Birth/Sex: Treating RN: [birth year] (70 y.o. [name] Primary Care Provider: [name] Other Clinician: Referring Provider: Treating Provider/Extender: Tommy Rainwater in Treatment: 109 Active Problems ICD-10 Encounter Code Description Active Date MDM Diagnosis 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, stage 4 12/30/2018 No Yes E11.622 Type 2 diabetes mellitus with other skin ulcer 12/30/2018 No Yes M86.68 Other chronic osteomyelitis, other site 11/28/2020 No Yes Inactive Problems ICD-10 Code Description Active Date Inactive Date G82.20 Paraplegia, unspecified 12/30/2018 12/30/2018 M86.68 Other chronic osteomyelitis, other site 02/17/2019 02/17/2019 S80.811D Abrasion, right lower leg, subsequent encounter 03/27/2020 03/27/2020 L97.811 Non-pressure chronic ulcer of other part of right lower leg limited to breakdown of skin 03/27/2020 03/27/2020 Resolved Problems Electronic Signature(s) Signed: 02/05/2021 4:29:54 PM By: Linton Ham MD Previous Signature: 02/05/2021 10:51:37 AM Version By: Lorrin Jackson Entered By: Linton Ham on 02/05/2021 11:01:01 -------------------------------------------------------------------------------- Progress Note Details Patient Name: Date of Service: [name]. 02/05/2021 10:00 A M Medical Record Number: [id number] Patient Account Number: [id number] Date of Birth/Sex: Treating RN: May 07, 1950 (70 y.o. [name],  [name] Primary Care Provider: [name] Other Clinician: Referring Provider: Treating Provider/Extender: Tommy Rainwater in Treatment: 109 Subjective History of Present Illness (HPI) The following HPI elements were documented for the patient's wound: Location: open ulceration of the left gluteal area, left heel and right ankle for about 5 months. Quality: Patient reports No Pain. Severity: Patient states wound(s) are getting worse. Duration: Patient has had the wound for > 5 months prior to seeking treatment at the wound center Context: The wound occurred when the patient has been paraplegic for about 3 years. Modifying Factors: Wound improving due to current treatment. Associated Signs and Symptoms: Patient reports having foul odor. this 70 year old patient who is known to have hypertension, hypothyroidism, breast cancer, chronic pain syndrome, paraplegia was noted to have a left gluteal decubitus ulcer and was brought into the hospital. During the course of her hospitalization she was debrided in the operating room by ankle wound. Bone cultures were taken at that time but were negative but clinically she was treated for osteomyelitis because of the probing down to bone and open exposed bone. Home health has been giving her antibioticss which include vancomycin and Zosyn. The patient was a smoker until about 3 weeks ago and used to  smoke about 10 cigarettes a day for a long while. 12/13/2014 - details of her operative note from 11/03/2014 were reviewed -- PROCEDURE: 1. Excisional debridement skin, subcutaneous, muscle left ischium 35 cm2 2. Excisional debridement skin, subcutaneous tissue left heel 27 cm2 3. Excisional debridement right ankle skin, subcutaneous, bone 30 cm2 01/24/2015 -- she has some issues with her wheelchair cushion but other than that is doing very well and has received Podus boots for her feet. 02/14/2015 -- she was using her old offloading boots and this  seemed to have caused her a new pressure ulcer on the left posterior heel near the superior part just below the Achilles tendon. 03/07/2015 -- she has a new ulceration just to the left of the midline on her sacral region more on the left buttock and this has been there for Dr. Leland Johns and had all the wounds sharply debrided. The debridement was done for the left ischial wound, the left heel wound and the right about a week. 08/22/2015 -- was recently admitted to hospital between May 5 and 08/13/2015, with sepsis and leukocytosis due to a UTI. she was treated for a sepsis complicating Escherichia coli UTI and kidney stones. She also had metabolic and careful up at the secondary to pyelonephritis. He received broad-spectrum antibiotics initially and then received Macrobid as per urology. She was sent home on nitrofurantoin. during her admission she had a CT scan which showed exposed left ischial tuberosity without evidence of osteolysis. 09/12/2015-- the patient is having some issues with her air mattress and would like to get a opinion from medical modalities. 10/10/2015 -- the issue with her air mattress has not yet been sorted out and the new problem seems to be a lot of odor from the wound VAC. 11/27/2015 -- the patient was admitted to the hospital between July 23 and 10/31/2015. Her problems were sepsis, osteomyelitis of the pelvic bone and acute pyelonephritis. CT of the abdomen and pelvis was consistent with a left-sided pyelonephritis with hydronephrosis and also just showed new sclerosis of the posterior portion of the left anterior pubic ramus suggestive of periosteal reaction consistent with osteomyelitis. She was treated for the osteomyelitis with infectious disease consult recommending 6 weeks of IV antibiotics including vancomycin and Rocephin and the antibiotics were to go on until 12/10/2015. He was seen by Dr. Iran Planas plastic surgery and Dr. Linus Salmons of infectious disease. She had a  suprapubic catheter placed during the admission. CT scan done on 10/28/2015 showed specifically -- New sclerosis of the posterior portion of the left inferior pubic ramus with aggressive periosteal reaction, consistent with osteomyelitis, with adjacent soft tissue gas compatible with previously described decubitus ulcer. 12/12/2015 -- she was recently seen by Dr. Linus Salmons, who noted good improvement and CRP and ESR compared to before and he has stopped her antibiotic as per plans to finish on September 4. The patient was encouraged to continue with wound care and consider hyperbaric oxygen therapy. Today she tells me that she has consented to undergo hyperbaric oxygen therapy and we can start the paperwork. 01/02/2016 -- her PCP had gained about 3 years but she still persists in having problems during hyperbaric oxygen therapy with some discomfort in the ears. 01/09/16; pressure area with underlying osteomyelitis in the left buttock. Wound bed itself has some slight amount of grayish surface slough however I do not think any debridement was necessary. There is no exposed bone soft tissue appears stable. She is using a wound VAC 01/16/16; back for weekly wound review  in conjunction with HBO. She has a deep wound over the left initial tuberosity previously treated with 6 weeks of IV antibiotics for osteomyelitis. Wound bed looks reasonably healthy although the base of this is still precariously close to bone. She has been using a wound VAC. 01/23/2016 -- she has completed her course of antibiotics and this week the only new thing is her right great toe nail was avulsed and she has got an open wound over the nailbed. 01/31/16 she has completed her course of antibiotics. Her right great toenail avulsed last week and she's been using silver alginate for this as well. Still using a wound VAC to the substantial stage IV wound over the left ischial tuberosity 03/05/2016 -- the patient has had a opinion from the  plastic surgery group at [facility] and details of this are not available yet but the patient's verbal report has been heard by me. Did not sound like there was any optimistic discussion regarding reconstruction and the net result would be to continue with the wound VAC application. I will await the official reports. Addendum: -- she was seen at [facility] by Dr. Tressa Busman. After a thorough review and from what I understand spending 45 minutes with the patient his assessment has been noted by me in detail and the management options were: 1. Continued pressure offloading and wound care versus operative procedures including wound excision 2. Soft tissue and bone sampling 3. If the wound gets larger wound closure would be done using a variety of plastic surgical techniques including but not limited to skin substitute, possible skin graft, local versus regional flaps, negative pressure dressing application. 4. He discussed with her details of flap surgery and the risks associated 5. He made a comment that since the patient was operated on by Dr. Leland Johns of [facility] plastic surgery unit in [place] the patient may continue to follow-up there for further evaluation for surgical flap closure in the future. 03/19/2016 -- the patient continues to be rather depressed and frustrated with her lack of rapid progress in healing this wound especially because she thought after hyperbaric oxygen therapy the wound would heal extremely fast. She now understands that was not the implied benefit on wound care which was the recommendation for hyperbaric oxygen therapy. I have had a lengthy discussion with the patient and her husband regarding her options: 1. Continue with collagen and wound VAC for the primary dressing and offloading and all supportive care. 2. See Dr. Iran Planas for possible placement of Acell or Integra in the OR. 3. get a second opinion from a  wound care center and surrounding regions/counties 05/07/2016 -- Note from Dr. Celedonio Miyamoto, who noted that the patient has declined flap surgery. She has discussed application of A cell, and try a few applications to see how the wound progresses. She is also recommended that we could apply products here in the wound center, like Oasis. during her preop workup it was found that her hemoglobin A1c was 11% and she has now been diagnosed as having diabetes mellitus and has been put on appropriate treatment by her PCP 05/28/2016 -- tells me her blood sugars have been doing well and she has an appointment to see her PCP in the next couple of weeks to check her hemoglobin A1c. Other than that she continues to do well. 06/25/2016 -- have not seen her back for the last month but she says her health has been about the same  and she has an appointment to check the A1c next week 09/10/16 ---- was seen by Dr. Celedonio Miyamoto -- who applied Acell and saw her back in follow-up. She has recommended silver alginate to the wound every other day and cover with foam. If no significant drainage could transition to collagen every other day. She recommended discontinuing wound VAC. There were no plans to repeat application of Acell. The patient expressed that her husband could do the wound care as going to the Wound Ctr., would cost several $100 for each visit. 10/21/2016 -- her insurance company is getting her new mattress and she is pleased about that. Other than that she has been doing dressings with PolyMem Silver and has been doing very well 02/18/2017 -- she has gone through several changes of her mattress and has not been pleased with any of them. The ventricles are still working on trying to fit her with the appropriate low air-loss mattress. She has a new wound on the gluteal area which is clearly separated from the original wound. 03/25/17-she is here in follow-up evaluation for her left ischialpressure ulcer.  She remains unsatisfied with her pressure mattress. She admits to sitting multiple hours a day, in the bed. We have discussed offloading options. The wound does not appear infected. Nutrition does not appear to be a concern. Will follow-up in 4 weeks, if wound continues to be stalled may consider x-ray to evaluate for refractory osteomyelitis. 04/21/17; this is a patient that I don't know all that well. She has a chronic wound which at one point had underlying osteomyelitis in the left ischial tuberosity. This is a stage IV pressure ulcer. Over the last 3 months she has a stage II wound inferiorly to the original wound. The last time she was here her dressing was changed to silver collagen although the patient's husband who changes the dressing said that the collagen stuck to the wound and remove skin from the superficial area therefore he switched back to [place] 05/13/17; this is a patient we've been following for a left ischial tuberosity wound which was stage IV at one point had underlying osteomyelitis. Over the last several months she's had a stage II wound just inferior and medial to the related to the wound. According to her husband he is using Endoform layer with collagen although this is not what I had last time. According to her husband they are using Elgie Congo with collagen although I don't quite know how that started. She was hospitalized from 1/20 through 04/30/16. This was related to a UTI. Her blood cultures were negative, urine culture showed multiple species. She did have a CT scan of the abdomen and pelvis which documented chronic osteomyelitis in the area of the wound inflammatory markers were unremarkable. She has had prior knowledge of osteomyelitis. It looks as though she received IV antibiotics in 2017 and was treated with a course of hyperbaric oxygen. 05/28/17; the wound over the left ischial tuberosity is deeper today and abuts clearly on bone. Nursing intake reported  drainage. I therefore culture of the wound. The more superficial area just below this looks about the same. They once again complained that there are mattress cover is not working although apparently [facility] is been noted to see this many times in the report is that the device is functional 06/18/17; the patient had a probing area on the left ischial tuberosity that was draining purulent fluid last time. This also clearly seemed to have open bone. Culture I  did showed pansensitive pseudomonas including third generation cephalosporins. I treated this with cefdinir 300 twice a day for 10 days and things seem to have improved. She has a more superficial wound just underneath this area. Amazingly she has a new air mattress through [facility] home [facility]. I think they gave this to her as a parking give. In any case this now works according to the patient may have something to do with why the areas are looking better. 07/09/17; the patient has a probing area in the left ischial tuberosity that still has some depth. However this is contracted in terms of the wound orifice although the depth is still roughly the same. There is no undermining. ooShe also has the satellite wound which is more superficial. This appears to have a healthy surface we've been using silver collagen 08/06/17; the patient's wound is over the left ischial tuberosity and a satellite lesion just underneath this. The original wound was actually a deep stage 4 wound. We have made good progress in 2 months and there is no longer exposed bone here. 09/03/17; left ischial tuberosity actually appears to be quite healthy. I think we are making progress. No debridement is required. There is no surrounding erythema 10/01/17 I follow this patient monthly for her left ischial tuberosity wound. There is 2 areas the original area and a satellite area. The satellite area looks a lot better there is no surrounding erythema. Her husband relates that he  is having trouble maintaining the dressing. This has to do with the soft tissue around it. He states he puts the collagen in but he cannot make sure that it stays in even with the ABD pads and tape that he is been using 10/29/17; patient arrives with a better looking noon today. Some of the satellite lesions have closed. using Prisma 11/26/17; the patient has a large cone-shaped area with the tip of the Cone deep within her buttock soft tissue. The walls of the Cone are epithelialized however the base is still open. The area at the base of this looks moist we've been using silver collagen. Will change to silver alginate 12/31/2017; the wound appears to have come in fairly nicely. Using silver alginate. There is no surrounding maceration or infection 01/28/18; there is still an open area here over the left initial tuberosity. Base of this however looks healthy. There is no surrounding infection 02/25/18; the area of its open is over the left ischial tuberosity. The base of this is where the wound is. This is a large inverted cone-shaped area with the wound at the tip. Dimensions of the wound at the tip are improved. There is a area of denuded skin about halfway towards the tip which her husband thinks may have happened today when he was bathing her. 04/20/17; the area is still open over the left initial tuberosity. 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 This is now completely closed over although it is closed over with a divot and skin over bone at the base of this. Continued aggressive offloading will be necessary. 12/30/2018 READMISSION This is a 70 year old woman with chronic paraplegia. I picked her up for her care from Dr. Con Memos in this clinic after he departed. She had a stage IV pressure wound over the  left ischial tuberosity. She was treated  twice for her underlying osteomyelitis and this I believe firstly in 2016 and again in 2017. There were some plans at some point for flap closure of this however she was discovered to have uncontrolled diabetes and I do not think this was ever accomplished. She ultimately healed over in this clinic and was discharged in May. She has a large cone-shaped indentation with the tip of this going towards the left ischial tuberosity. It is not an easy area to examine but at that time I thought all of this was epithelialized. Apparently there was a reopening here shortly after she left the clinic last time. She was admitted to hospital at the end of June for Klebsiella bacteremia felt to be secondary to UTI. A CT scan of the pelvis is listed below and there was initially some concern that she had underlying osteomyelitis although I believe she was seen by infectious disease and that was felt to be not the case: I do not see any new cultures or inflammatory markers IMPRESSION: 1. No CT evidence for acute intra-abdominal or pelvic abnormality. Large volume of stool throughout the colon. 2. Enlarged fatty liver with fat sparing near the gallbladder fossa 3. Cortical scarring right kidney. Bilateral intrarenal stones without hydronephrosis. Thick-walled urinary bladder decompressed by suprapubic catheter 4. Deep left decubitus ulcer with underlying left ischial changes suggesting osteomyelitis. Her husband has been using silver collagen in the wound. She has not been systemically unwell no fever chills eating and drinking well. They rigorously offload this wound only getting up in the wheelchair when she is going to appointments the rest of the time she is in bed. 10/8; wound measures larger and she now has exposed bone. We have been using silver alginate 11/12 still using silver alginate. The patient saw Dr. Megan Salon of infectious disease. She was started on Augmentin  500 mg twice daily. She is due to follow-up with Dr. Megan Salon I believe next week. Lab work Dr. Megan Salon requested showed a sedimentation rate of 28 and CRP of 20 although her CRP 1 year ago was 18.8. Sedimentation rate 1 year ago was 11 basic metabolic panel showed a creatinine of 1.12 12/3; the patient followed up with Dr. Megan Salon yesterday. She is still on Augmentin twice daily. This was directed by Dr. Megan Salon. The patient's inflammatory markers have improved which is gratifying. Her C-reactive protein was repeated yesterday and follow-up booked with infectious disease in January. In addition I have been getting secure text messages I think from palliative care through [facility] [employer]. I think they were hoping to provide services to the patient in her home. They could not get a hold of the primary physician and so they reached out to me on 2 separate occasions. 12/17; patient last saw Dr. Megan Salon on 12/2. She is finishing up with Augmentin. Her C-reactive protein was 20 on 10/21, 10.1 on 11/19 and 17 on 12/2. The wound itself still has depth and undermining. We are using Santyl with the backing wet-to-dry 04/27/2019. The wound is gradually clearing up in terms of the surface although it is not filled in that much. Still abuts right against bone 2/4; patient with a deep pressure ulcer over the left ischial tuberosity. I thought she was going to follow-up with infectious disease to follow her inflammatory markers although the patient states that they stated that they did not need to see her unless we felt it was necessary. I will need to check their notes. In any case we ordered  moistened silver collagen back with wet-to-dry to fill in the depth of the wound although apparently [facility] sent silver alginate which they have been using since they were here the last time. Is obviously not what we ordered. 2/25. Not much change in this wound it is over the left ischial tuberosity  recurrent wound. We have been using silver collagen with backing wet-to-dry. I think the wound is about the same. There is still some tunneling from about 10-12 o'clock over the ischial tuberosity itself 3/11; pressure ulcer over the left ischial tuberosity. Since she was last here the wound VAC was started and apparently going quite well. We are able to get the home health company that accepts [place] healthcare which is in itself sometimes problematic. There is been improvements in the wound the tunneling seems to be better and is contracted nicely 4/8; 1 month follow-up. Since she was last here we have been using silver collagen under a wound VAC. Some minor contraction I think in wound volume. She is cared for diligently by her husband including pressure relief, incontinence management, nutritional support etc. 6/1; this is almost a 80-monthfollow-up. She is been using silver collagen under wound VAC. Circular area over the left ischial tuberosity. She has been using silver collagen under wound VAC 7/8; 1 month follow-up. Silver collagen under the VAC not really a lot of progress. Tissue at the base of the wound which is right against bone and the tissue next that this does not look completely viable. She is not currently on any antibiotics, she had underlying osteomyelitis I need to look this over 8/16; we are using silver collagen under wound VAC to the left ischial tuberosity wound. Comes in today with absolutely no change in surface area or depth. There is no exposed bone. I did look over her infectious disease notes as I said I would do last time. She last saw Dr. CMegan Salonin December 2020. She completed 6 weeks of Augmentin. This was in response to a bone culture I did showing methicillin susceptible staph aureus and Enterococcus. She was supposed to come back to see Dr. CMegan Salonat some point although they say that that appointment was canceled unless I chose to recommend return. I  think there was supposed to be follow-up with inflammatory markers but I cannot see that that was ever done. She has not been on antibiotics since 9/21; monthly follow-up. We received a call from home health nurse last evening to report green drainage coming out of the wound. Lab work I ordered last time showed a white count of 5.2 a sedimentation rate of 45 and a C-reactive protein of 25 however neither one of the 2 values are substantially different from her previous values in October 2020 or December 2020. Both are slightly higher but only marginally. Otherwise no new complaints from the patient or her husband 10/19; 1 month follow-up. PCR culture I did last time showed medium quantities of Pseudomonas lower quantities Klebsiella and Enterococcus faecalis group B strep and Peptostreptococcus. I gave her Augmentin for 2 weeks. I am not really sure of my choice of this I would not cover Pseudomonas. She is still having green drainage. Wound itself looks satisfactory there is not a lot of depth wound bed looks healthy 11/16; patient has completed the antibiotics still using gentamicin and silver alginate on the wound. There is improvement in the surface area 12/21; in general the area on the buttock looks somewhat better. Surface looks healthy although I do not  know that there is been much improvement in the wound volume. We have been using silver alginate and Hydrofera Blue. Less drainage. In passing the husband showed me an abrasion injury on the left anterior tibia. Covered in necrotic surface. He has noticed this for about a week and has been putting silver collagen on it. He is completely uncertain about how this happened 1/25; monthly follow-up. The area on the left buttock is about the same. This does not go to bone but a fairly deep wound surface of the wound is of questionable viability. The abrasion injury that they showed me last time apparently was closed out by home health because they  thought it was healed but certainly is not although it is just about healed. As a result they haven't been applying anything to this area Finally I did discuss with the patient and her husband the idea of an advanced treatment product to try and get a proper base to this wound I was thinking of Puraply however actually the patient points out that her co-pay for coming to visit [place] i.e. the facility, charge would be unaffordable if they have to, on a weekly basis 2/22; pressure ulcer on the left buttock appears deeper to me and abuts on the ischial tuberosity. I thought initially there was exposed bone but there is a rim of tissue over this area. She also has a superficial over the right anterior mid tibia. Been using silver collagen to these areas without much success. I have looked over the patient's past history with regards to the area on the left ischium. She did have underlying osteomyelitis here dating back I think to late 2020. She saw Dr. Megan Salon she received a 6-week course of oral antibiotics in response to a bone culture that I did. This does not appear to be infected but it certainly has not been improving in terms of granulation. I do not believe she has had any recent imaging studies 3/29; 1 month follow-up. Pressure ulcer on the left buttock which she has been dealing with with for a number of years. She was treated for underlying osteomyelitis at 1.2 or 3 years ago I think with infectious disease help. She also had I think a flap closure by Dr. Leland Johns and that lasted for about a year and then reopened. I have not been able to get this patient to progress towards healing although truthfully the wound is absolutely no worse. We have been using Hydrofera Blue 4/26; patient presents for 1 week follow-up. She has been using silver collagen to the area every other day. She has home health that comes out once a week to help with dressing changes as well. The patient is interested in trying  a skin substitute over this area. She states she is trying to relieve pressure off of it most of the day. 5/24; patient presents for 1 month follow-up. She has been using silver collagen to the area every other day. She has no complaints today. She is interested in the skin substitute. She tries to leave relieve pressure off Her bottom however is not able to most of the day 6/14; using silver collagen to the area over the left ischial tuberosity. Wound does not appear to be doing particularly well. Open to bone 6/28; the patient patient presented last time with a marked deterioration. Depth probing all the way to bone. The bone itself did not look particularly viable. In spite of this the x-ray I did showed longstanding ulcer over the  left ischial tuberosity with chronic bone involvement/reaction that was also seen by CT in 2020. Lab work did not show just active infection with a sed rate of 14 and a C-reactive protein of 7.1. Her comprehensive metabolic panel was normal including an albumin of 4.1 white count was 6 7/19; patient was here 3 weeks ago with a marked deterioration in her wound over the left ischial tuberosity. I ordered a CT scan of the area for 1 reason or another this just did not get done. It is now booked for 8 days from now. She was supposed to come back for bone culture and pathology. That did not happen either. We have been using silver collagen. As usual she is diligently looked after by her husband 8/24; 5-week follow-up. Since the patient was last here the biopsy that I did of her ischial tuberosity came back suggesting osteomyelitis. Culture showed strep. I started her on Augmentin. She was seen by infectious disease Dr. Lucianne Lei dam and the Augmentin was continued and she is still taking it. CT scan did not suggest anything other than chronic osteomyelitis without much change from her previous study. Her C-reactive protein was only 7 and sedimentation rate was 1.1. We are still  using silver collagen to the wound. She has home health. Her husband is very diligent in her care for this reason I have never pursued a diverting colostomy. She would not agree to this surgery in any case. Finally we have discussed plastic surgery with him in the past and she is not interested in a myocutaneous flap. 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 Since she was last here she was in the ER last week with abdominal pain. She was found to be impacted however in the course of the review there somebody gave her some IV morphine and apparently she developed hives and blisters. There is still tense blisters on her plantar left fifth and fourth fingers 10/4; the patient has completed her Augmentin and is due to follow-up with Dr. Drucilla Schmidt in early November. Her wound today measures about the same but looks a little healthier in terms of granulation there is no exposed bone. I note that she was admitted to hospital for 2 days from 9/21 through 9/23 for delirium. She had received Versed for glaucoma surgery ultimately that was felt to be the etiology. Her blood cultures were negative she had 30,000 colonies of Pseudomonas in her urine. She did receive broad-spectrum antibiotic therapy but ultimately her wound on the buttock was not felt to be the cause. As mentioned she has completed her antibiotics still using silver collagen 11/1; patient has completed her antibiotics for the underlying osteomyelitis. She apparently follows up with Dr. Drucilla Schmidt next week. The wound does not look too bad perhaps slightly narrower in terms of width but the depth is about the same. We have been using Hydrofera Blue. The patient talks to me about a wound VAC and made it sound as though she was recently on 1 although I do not see this. The other option is an advanced treatment product like Oasis but that means weekly trips into the clinic. She has previously said she does not want an attempt at plastic  surgery Objective Constitutional Sitting or standing Blood Pressure is within target range for patient.. Pulse regular and within target range for patient.Marland Kitchen Respirations regular, non-labored and within target range.. Temperature is normal and within the target range for the patient.Marland Kitchen Appears in no distress. Vitals Time Taken: 10:11  AM, Height: 63 in, Weight: 185 lbs, BMI: 32.8, Temperature: 97.8 F, Pulse: 81 bpm, Respiratory Rate: 18 breaths/min, Blood Pressure: 113/69 mmHg. General Notes: Wound exam; and at the base of this about the same as last time there is not much of it over bone but there is no exposed bone either. Slight undermining at 1 end and the other in this oval-shaped wound. Seems to have contracted a bit in terms of width. There is no evidence of surrounding soft tissue infection Integumentary (Hair, Skin) Wound #12 status is Open. Original cause of wound was Pressure Injury. The date acquired was: 09/06/2018. The wound has been in treatment 109 weeks. The wound is located on the Left Ischial Tuberosity. The wound measures 2.1cm length x 0.9cm width x 1.2cm depth; 1.484cm^2 area and 1.781cm^3 volume. There is Fat Layer (Subcutaneous Tissue) exposed. There is undermining starting at 6:00 and ending at 12:00 with a maximum distance of 0.8cm. There is a medium amount of purulent drainage noted. The wound margin is well defined and not attached to the wound base. There is large (67-100%) red, pink granulation within the wound bed. There is no necrotic tissue within the wound bed. Assessment Active Problems ICD-10 Pressure ulcer of left buttock, stage 4 Type 2 diabetes mellitus with other skin ulcer Other chronic osteomyelitis, other site Plan Follow-up Appointments: Return Appointment in 2 weeks. - with Dr. Dellia Nims Other: - Has follow up with ID 02/13/21 Cellular or Tissue Based Products: Cellular or Tissue Based Product Type: - Will run IVR for Oasis Skin Sub Bathing/ Shower/  Hygiene: May shower and wash wound with soap and water. - with dressing changes Edema Control - Lymphedema / SCD / Other: Elevate legs to the level of the heart or above for 30 minutes daily and/or when sitting, a frequency of: Avoid standing for long periods of time. Off-Loading: Turn and reposition every 2 hours Home Health: No change in wound care orders this week; continue Home Health for wound care. May utilize formulary equivalent dressing for wound treatment orders unless otherwise specified. Other Home Health Orders/Instructions: - [facility] WOUND #12: - Ischial Tuberosity Wound Laterality: Left Cleanser: Soap and Water (Home Health) 1 x Per Day/30 Days Discharge Instructions: May shower and wash wound with dial antibacterial soap and water prior to dressing change. Cleanser: Wound Cleanser (Home Health) 1 x Per Day/30 Days Discharge Instructions: Cleanse the wound with wound cleanser prior to applying a clean dressing using gauze sponges, not tissue or cotton balls. Peri-Wound Care: Skin Prep (Home Health) 1 x Per Day/30 Days Discharge Instructions: Use skin prep as directed Prim Dressing: Hydrofera Blue Ready Foam, 2.5 x2.5 in 1 x Per Day/30 Days ary Discharge Instructions: cut to fit into wound Secondary Dressing: ComfortFoam Border, 4x4 in (silicone border) ([place]) 1 x Per Day/30 Days Discharge Instructions: Apply over primary dressing as directed. 1. I am continuing Hydrofera Blue for now 2. Oasis through her insurance 3. We will need to look back at the wound VAC situation I did not see it on an quick perusal of my notes. She did mention that they had a hard time maintaining a seal at home and I can see where that would be the case. This was initially a deep inverted cone-shaped wound 4. I see no evidence of infection. She apparently follows up with Dr. Drucilla Schmidt next week her antibiotics have completed Electronic Signature(s) Signed: 02/05/2021 4:29:54 PM By: Linton Ham MD Entered By: Linton Ham on 02/05/2021 11:07:53 -------------------------------------------------------------------------------- SuperBill  Details Patient Name: Date of Service: [name], [name] 02/05/2021 Medical Record Number: [id number] Patient Account Number: [id number] Date of Birth/Sex: Treating RN: 1951/03/10 (70 y.o. [name] Primary Care Provider: [name] Other Clinician: Referring Provider: Treating Provider/Extender: Tommy Rainwater in Treatment: 109 Diagnosis Coding ICD-10 Codes Code Description [id number] Pressure ulcer of left buttock, stage 4 E11.622 Type 2 diabetes mellitus with other skin ulcer M86.68 Other chronic osteomyelitis, other site Facility Procedures The patient participates with Medicare or their insurance follows the Medicare Facility Guidelines: CPT4 Code Description Modifier Quantity [id number] [employer] VISIT-LEV 3 EST PT 1 Physician Procedures : CPT4 Code Description Modifier [id number] - WC PHYS LEVEL 3 - EST PT ICD-10 Diagnosis Description 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, stage 4 M86.68 Other chronic osteomyelitis, other site Quantity: 1 Electronic Signature(s) Signed: 02/05/2021 4:29:54 PM By: Linton Ham MD Entered By: Linton Ham on 02/05/2021 11:08:23

## 2021-02-13 ENCOUNTER — Ambulatory Visit: Payer: Medicare Other | Admitting: Infectious Disease

## 2021-02-13 ENCOUNTER — Other Ambulatory Visit: Payer: Self-pay

## 2021-02-13 VITALS — BP 125/91 | HR 74 | Temp 98.4°F | Resp 16 | Ht 63.0 in | Wt 183.0 lb

## 2021-02-13 DIAGNOSIS — L89154 Pressure ulcer of sacral region, stage 4: Secondary | ICD-10-CM | POA: Diagnosis not present

## 2021-02-13 DIAGNOSIS — G8220 Paraplegia, unspecified: Secondary | ICD-10-CM | POA: Diagnosis not present

## 2021-02-13 DIAGNOSIS — R7881 Bacteremia: Secondary | ICD-10-CM

## 2021-02-13 DIAGNOSIS — C50311 Malignant neoplasm of lower-inner quadrant of right female breast: Secondary | ICD-10-CM | POA: Diagnosis not present

## 2021-02-13 DIAGNOSIS — M86652 Other chronic osteomyelitis, left thigh: Secondary | ICD-10-CM

## 2021-02-13 DIAGNOSIS — B961 Klebsiella pneumoniae [K. pneumoniae] as the cause of diseases classified elsewhere: Secondary | ICD-10-CM

## 2021-02-13 DIAGNOSIS — I10 Essential (primary) hypertension: Secondary | ICD-10-CM | POA: Diagnosis not present

## 2021-02-13 DIAGNOSIS — A491 Streptococcal infection, unspecified site: Secondary | ICD-10-CM

## 2021-02-13 NOTE — Progress Notes (Signed)
Complete  Chief complaint follow-up for chronic wound and chronic osteomyelitis Subjective:    Patient [name], female    DOB: 08-17-1950, 70 y.o.   MRN: [id number]  HPI  [name] is an 70 year old black woman with a history of cervical myelopathy status post cervical spine surgery which unfortunately resulted in paraplegia paraplegia.  S She  has had problems with recurrent pressure ulcers in her sacral area over the last 6 years.  She had been hospitalized January 2019 underwent debridement.  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  She also was evaluated by plastic surgery at [facility].  A flap closure of the wound was planned but never performed.  She had comorbid controlled diabetes at that time.  She did stop smoke cigarettes.  Wound eventually closed earlier that year and she had been discharged from the wound care center.  She is then hospitalized again in June 2020 with Klebsiella UTI and bacteremia.  CT scan done on September 25, 2018 showed irregular appearance of the left ischium concerning for osteomyelitis.  Was very similar to findings on CT done in January 2019.  She was seen by Dr. Linus Salmons my partner I did not find that there was clinical evidence for infection I recommended focusing on the gram-negative bacteremia from urinary source.  That fall she then developed a new area on her buttocks that opened up she went back to the wound care center on September 24 underwent debridement.  She returned wound care center on January 13, 2019 Dr. Dellia Nims noted palpable bone of the base of the wound and performed a biopsy grams into the bone did not show organism cultures grew methicillin sensitive Staph capitis and Enterococcus.  Patient was seen by my partner Dr. Megan Salon who after thorough discussion with the patient and husband opted to treat her with 6 weeks of  Augmentin.  According to Dr. Hale Bogus notes the wound had not changed a great deal at that time but had shown some improvement and her inflammatory markers were normal he continued an additional 2 weeks of Augmentin in December 2020 with plans for her to see him again in January 2021.  According to the patient and the husband the area that Dr. Christella Noa and been treating did heal up eventually.  However since then a new ulcer opened up in a very similar location.  She has been followed very closely by the wound care center by Dr. Heber Butler and Dr. Dellia Nims.  The wound was not closing.  CT scan was performed on November 02, 2020 which I personally reviewed and does show a left-sided ulcer with findings concerning for osteomyelitis albeit more of a chronic 1 without changes compared to prior scan and no evidence of new bone destruction or abscess.  There is also some soft tissue thickening along with lower sacrum. More recently Dr. Dellia Nims apparently biopsied the palpable bone and this was taken to pathology lab.  Consistent with osteomyelitis a bone culture was also taken which yielded group B streptococcus.  The patient was placed on Augmentin and referred urgently to our clinic.  Talking the patient she didnot have any systemic symptoms of fevers chills malaise nausea dizziness lightheadedness.  The husband and the patient indicated that she had not had increasing drainage or purulent material coming from the wound.  They believe that largely the reason that the biopsies were performed was that the wound simply is  failing to close.  I raised the question about whether diversion colostomy had been considered and if that seen plastic surgery recently.  She is very much against diversion colostomy.  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 Dellia Nims indicate that she has not.  She lives at home and has a bed that is supposed to relieve the pressure but she says does not  work very well.  Her husband is very diligent with her wound care but they both agree that him trying to turn her every 2 hours is not possible.  I last saw her we checked basic labs and her inflammatory markers were completely normal.  We decided to continue her Augmentin to complete a 6-week course.  Shes aw Dr. Dellia Nims in follow-up and the wound is continuing to improve with granulation tissue present.  Reviewed labs and I did notice that her creatinine had been up relative to values we have.  She says her PCP gets blood work on her every 12 weeks so there is likely not a terribly new finding with a creatinine at 1.3 versus a year ago when it was 1.  Her inflammatory markers again were normal.  She continued to feel well.  We had her complete Augmentin.  In the interim she had surgery for a cataract and in the ensuing days thereafter presented the hospital with confusion.  She had blood cultures taken urine cultures that only grew 10,000 colony-forming units of Pseudomonas.  She was seen by my partner Dr. Gale Journey who did not feel that she had any evidence of systemic infection that had caused her admission and felt more likely that the patient might have had an adverse reaction to anesthesia.  Her antibiotics were stopped and she was ultimately discharged in the hospital.  She been followed closely with Dr. Dellia Nims with wound care.  Wound continues to improve but has not resolved yet.  She does not have fevers chills nausea or other systemic symptoms.          Past Medical History:  Diagnosis Date   Anemia    Arthritis    "all over" (04/13/2012)   Cancer of right breast ([place]) 2011   Cervical neuropathy    PERIPHERAL NEUROPATHY , HAD CERVICAL FUSION   Diabetes mellitus without complication ([place])    Difficult intubation    Fastrach #4 LMA then # 7 ETT used in 2006 [facility], cervical laminectomy)   GERD (gastroesophageal reflux disease)    Group B streptococcal infection 11/08/2020    History of kidney stones    Hypercholesteremia    Hypertension    Hypothyroidism    Kidney stones    Neurogenic bladder    Osteomyelitis ([facility])    Paraplegia ([facility])    Peripheral neuropathy    PONV (postoperative nausea and vomiting)    Renal insufficiency 12/11/2020   Sacral decubitus ulcer 10/2014   Sepsis due to urinary tract infection [facility])     Past Surgical History:  Procedure Laterality Date   ABDOMINAL HYSTERECTOMY  ~ [place] Right 2011   BREAST LUMPECTOMY Right 2011   CALDWELL LUC  ~ 2003   "benign tumor removed from up under gum" (04/13/2012)   DACROCYSTORHINOSTOMY  ~ 2000   "put stents in my tear ducts; both eyes" (04/13/2012)   EYE SURGERY  2004   STENTS TO BIL EYES   I & D EXTREMITY Bilateral 11/03/2014  Procedure: IRRIGATION AND DEBRIDEMENT BILATERAL HEEL;  Surgeon: Irene Limbo, MD;  Location: [place];  Service: Plastics;  Laterality: Bilateral;   INCISION AND DRAINAGE OF WOUND Left 11/03/2014   Procedure: IRRIGATION AND DEBRIDEMENT SACRAL ULCER;  Surgeon: Irene Limbo, MD;  Location: [place];  Service: Plastics;  Laterality: Left;   JOINT REPLACEMENT     POSTERIOR CERVICAL LAMINECTOMY  2006   SKIN SPLIT GRAFT Left 08/05/2016   Procedure: SURGICAL PREP  FOR GRAFTING APPLICATION ACELL TO LEFT ISCHIUM;  Surgeon: Irene Limbo, MD;  Location: [place];  Service: Plastics;  Laterality: Left;   TONSILLECTOMY AND ADENOIDECTOMY  ~ 1954   TOTAL KNEE ARTHROPLASTY WITH REVISION COMPONENTS  04/13/2012   Procedure: TOTAL KNEE ARTHROPLASTY WITH REVISION COMPONENTS;  Surgeon: Hessie Dibble, MD;  Location: [place];  Service: Orthopedics;  Laterality: Left;    Family History  Problem Relation Age of Onset   Heart disease Father       Social History   Socioeconomic History   Marital status: Married    Spouse name: Not on file   Number of children: Not on file   Years of education: Not on file   Highest education level: Not on file  Occupational  History   Not on file  Tobacco Use   Smoking status: Former    Packs/day: 0.50    Years: 50.00    Pack years: 25.00    Types: Cigarettes    Quit date: 11/06/2014    Years since quitting: 6.2   Smokeless tobacco: Never  Substance and Sexual Activity   Alcohol use: No   Drug use: No   Sexual activity: Not Currently  Other Topics Concern   Not on file  Social History Narrative   Not on file   Social Determinants of Health   Financial Resource Strain: Not on file  Food Insecurity: Not on file  Transportation Needs: Not on file  Physical Activity: Not on file  Stress: Not on file  Social Connections: Not on file    Allergies  Allergen Reactions   Ivp Dye [Iodinated Diagnostic Agents] Other (See Comments)    "hot and sweaty and almost passed out"   Aspirin Hives   Morphine And Related Itching and Rash   Sulfa Antibiotics Hives     Current Outpatient Medications:    Ascorbic Acid (VITAMIN C) 1000 MG tablet, Take 1,000 mg by mouth daily., Disp: , Rfl:    bisacodyl (DULCOLAX) 10 MG suppository, Place 1 suppository (10 mg total) rectally daily as needed for severe constipation., Disp: 12 suppository, Rfl: 0   [employer] SENNA 8.6 MG tablet, Take 2 tablets by mouth daily as needed for constipation., Disp: , Rfl:    docusate sodium (COLACE) 100 MG capsule, Take 100 mg by mouth 2 (two) times daily as needed (FOR CONSTIPATION). , Disp: , Rfl:    DULoxetine (CYMBALTA) 30 MG capsule, Take 1 capsule (30 mg total) by mouth daily., Disp: 30 capsule, Rfl: 1   ferrous sulfate 325 (65 FE) MG tablet, Take 325 mg by mouth daily after supper. , Disp: , Rfl:    folic acid (FOLVITE) 1 MG tablet, Take 1 mg by mouth daily. , Disp: , Rfl:    levothyroxine (SYNTHROID, LEVOTHROID) 125 MCG tablet, Take 1 tablet (125 mcg total) by mouth daily before breakfast. (Patient taking differently: Take 125 mcg by mouth daily.), Disp: 30 tablet, Rfl: 0   metFORMIN (GLUCOPHAGE) 1000 MG tablet, Take 1,000 mg by mouth 2  (two)  times daily., Disp: , Rfl:    metoprolol (LOPRESSOR) 100 MG tablet, Take 100 mg by mouth 2 (two) times daily. , Disp: , Rfl:    Multiple Vitamin (MULTIVITAMIN) capsule, Take 1 capsule by mouth daily., Disp: , Rfl:    NIFEdipine (PROCARDIA XL/ADALAT-CC) 30 MG 24 hr tablet, Take 30 mg by mouth daily after supper. , Disp: , Rfl:    oxyCODONE-acetaminophen (PERCOCET) 10-325 MG tablet, Take 1 tablet by mouth every 6 (six) hours as needed for pain. , Disp: , Rfl:    polyethylene glycol (MIRALAX / GLYCOLAX) 17 g packet, Take 17 g by mouth 2 (two) times daily. (Patient taking differently: Take 17 g by mouth daily as needed for mild constipation.), Disp: 30 each, Rfl: 0   pregabalin (LYRICA) 25 MG capsule, Take 1 capsule (25 mg total) by mouth 2 (two) times daily., Disp: 60 capsule, Rfl: 1   Zinc Sulfate 220 (50 Zn) MG TABS, Take 50 mg by mouth daily., Disp: , Rfl:    Review of Systems  Constitutional:  Negative for activity change, appetite change, chills, diaphoresis, fatigue, fever and unexpected weight change.  HENT:  Negative for congestion, rhinorrhea, sinus pressure, sneezing, sore throat and trouble swallowing.   Eyes:  Negative for photophobia and visual disturbance.  Respiratory:  Negative for cough, chest tightness, shortness of breath, wheezing and stridor.   Cardiovascular:  Negative for chest pain, palpitations and leg swelling.  Gastrointestinal:  Negative for abdominal distention, abdominal pain, anal bleeding, blood in stool, constipation, diarrhea, nausea and vomiting.  Genitourinary:  Negative for difficulty urinating, dysuria, flank pain and hematuria.  Musculoskeletal:  Negative for arthralgias, back pain, gait problem, joint swelling and myalgias.  Skin:  Negative for color change, pallor, rash and wound.  Neurological:  Negative for dizziness, tremors, weakness and light-headedness.  Hematological:  Negative for adenopathy. Does not bruise/bleed easily.   Psychiatric/Behavioral:  Negative for agitation, behavioral problems, confusion, decreased concentration, dysphoric mood and sleep disturbance.    Wound 11/08/2020:       Objective:   Physical Exam Constitutional:      General: She is not in acute distress.    Appearance: Normal appearance. She is well-developed. She is not ill-appearing or diaphoretic.  HENT:     Head: Normocephalic and atraumatic.     Right Ear: Hearing and external ear normal.     Left Ear: Hearing and external ear normal.     Nose: No nasal deformity or rhinorrhea.  Eyes:     General: No scleral icterus.    Conjunctiva/sclera: Conjunctivae normal.     Right eye: Right conjunctiva is not injected.     Left eye: Left conjunctiva is not injected.     Pupils: Pupils are equal, round, and reactive to light.  Neck:     Vascular: No JVD.  Cardiovascular:     Rate and Rhythm: Normal rate and regular rhythm.     Heart sounds: Normal heart sounds, S1 normal and S2 normal. No murmur heard.   No friction rub.  Abdominal:     General: Bowel sounds are normal. There is no distension.     Palpations: Abdomen is soft.     Tenderness: There is no abdominal tenderness.  Musculoskeletal:        General: Normal range of motion.     Right shoulder: Normal.     Left shoulder: Normal.     Cervical back: Normal range of motion and neck supple.     Right hip:  Normal.     Left hip: Normal.     Right knee: Normal.     Left knee: Normal.  Lymphadenopathy:     Head:     Right side of head: No submandibular, preauricular or posterior auricular adenopathy.     Left side of head: No submandibular, preauricular or posterior auricular adenopathy.     Cervical: No cervical adenopathy.     Right cervical: No superficial or deep cervical adenopathy.    Left cervical: No superficial or deep cervical adenopathy.  Skin:    General: Skin is warm and dry.     Findings: No abrasion, ecchymosis or erythema.     Nails: There is no  clubbing.  Neurological:     Mental Status: She is alert and oriented to person, place, and time.     Sensory: No sensory deficit.     Coordination: Coordination normal.     Gait: Gait normal.  Psychiatric:        Attention and Perception: She is attentive.        Mood and Affect: Mood normal.        Speech: Speech normal.        Behavior: Behavior normal. Behavior is cooperative.        Thought Content: Thought content normal.        Judgment: Judgment normal.     Ischial wound 11/07/2020    Wound not examined today    Assessment & Plan:   Ischial osteomyelitis: She completed a course of therapy for this.  She will continue to follow with Dr. Dellia Nims.  As mentioned before inflammatory markers were resolved and normal last time we checked them.  I will see her back in 3 months time unless she has worsening of her wound and needs that might help.  Klebsiella pneumonia bacteremia: Resolved   Hypertension: Blood pressure much better controlled today in clinic Vitals:   02/13/21 0936  BP: (!) 125/91  Pulse: 74  Resp: 16  Temp: 98.4 F (36.9 C)  SpO2: 98%   Vaccine counseling she is received flu shot and updated COVID-19 vaccine

## 2021-02-19 ENCOUNTER — Encounter (HOSPITAL_BASED_OUTPATIENT_CLINIC_OR_DEPARTMENT_OTHER): Payer: Medicare Other | Admitting: Internal Medicine

## 2021-02-19 ENCOUNTER — Other Ambulatory Visit: Payer: Self-pay

## 2021-02-19 DIAGNOSIS — L89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, stage 4: Secondary | ICD-10-CM | POA: Diagnosis not present

## 2021-02-19 NOTE — Progress Notes (Signed)
[name], [name] ([id number]) Visit Report for 02/19/2021 HPI Details Patient Name: Date of Service: [name], [name]. 02/19/2021 10:00 A M Medical Record Number: [id number] Patient Account Number: [id number] Date of Birth/Sex: Treating RN: 1951/02/24 (70 y.o. [name], [name] Primary Care Provider: [name] Other Clinician: Referring Provider: Treating Provider/Extender: Tommy Rainwater in Treatment: 111 History of Present Illness Location: open ulceration of the left gluteal area, left heel and right ankle for about 5 months. Quality: Patient reports No Pain. Severity: Patient states wound(s) are getting worse. Duration: Patient has had the wound for > 5 months prior to seeking treatment at the wound center Context: The wound occurred when the patient has been paraplegic for about 3 years. Modifying Factors: Wound improving due to current treatment. ssociated Signs and Symptoms: Patient reports having foul odor. A HPI Description: this 70 year old patient who is known to have hypertension, hypothyroidism, breast cancer, chronic pain syndrome, paraplegia was noted to have a left gluteal decubitus ulcer and was brought into the hospital. During the course of her hospitalization she was debrided in the operating room by ankle wound. Bone cultures were taken at that time but were negative but clinically she was treated for osteomyelitis because of the probing down to bone and open exposed bone. Home health has been giving her antibioticss which include vancomycin and Zosyn. The patient was a smoker until about 3 weeks ago and used to smoke about 10 cigarettes a day for a long while. 12/13/2014 - details of her operative note from 11/03/2014 were reviewed -- PROCEDURE: 1. Excisional debridement skin, subcutaneous, muscle left ischium 35 cm2 2. Excisional debridement skin, subcutaneous tissue left heel 27 cm2 3. Excisional debridement right ankle skin, subcutaneous,  bone 30 cm2 01/24/2015 -- she has some issues with her wheelchair cushion but other than that is doing very well and has received Podus boots for her feet. 02/14/2015 -- she was using her old offloading boots and this seemed to have caused her a new pressure ulcer on the left posterior heel near the superior part just below the Achilles tendon. 03/07/2015 -- she has a new ulceration just to the left of the midline on her sacral region more on the left buttock and this has been there for Dr. Leland Johns and had all the wounds sharply debrided. The debridement was done for the left ischial wound, the left heel wound and the right about a week. 08/22/2015 -- was recently admitted to hospital between May 5 and 08/13/2015, with sepsis and leukocytosis due to a UTI. she was treated for a sepsis complicating Escherichia coli UTI and kidney stones. She also had metabolic and careful up at the secondary to pyelonephritis. He received broad-spectrum antibiotics initially and then received Macrobid as per urology. She was sent home on nitrofurantoin. during her admission she had a CT scan which showed exposed left ischial tuberosity without evidence of osteolysis. 09/12/2015-- the patient is having some issues with her air mattress and would like to get a opinion from medical modalities. 10/10/2015 -- the issue with her air mattress has not yet been sorted out and the new problem seems to be a lot of odor from the wound VAC. 11/27/2015 -- the patient was admitted to the hospital between July 23 and 10/31/2015. Her problems were sepsis, osteomyelitis of the pelvic bone and acute pyelonephritis. CT of the abdomen and pelvis was consistent with a left-sided pyelonephritis with hydronephrosis and also just showed new sclerosis of the posterior portion of  the left anterior pubic ramus suggestive of periosteal reaction consistent with osteomyelitis. She was treated for the osteomyelitis with infectious disease consult  recommending 6 weeks of IV antibiotics including vancomycin and Rocephin and the antibiotics were to go on until 12/10/2015. He was seen by Dr. Iran Planas plastic surgery and Dr. Linus Salmons of infectious disease. She had a suprapubic catheter placed during the admission. CT scan done on 10/28/2015 showed specifically -- New sclerosis of the posterior portion of the left inferior pubic ramus with aggressive periosteal reaction, consistent with osteomyelitis, with adjacent soft tissue gas compatible with previously described decubitus ulcer. 12/12/2015 -- she was recently seen by Dr. Linus Salmons, who noted good improvement and CRP and ESR compared to before and he has stopped her antibiotic as per plans to finish on September 4. The patient was encouraged to continue with wound care and consider hyperbaric oxygen therapy. Today she tells me that she has consented to undergo hyperbaric oxygen therapy and we can start the paperwork. 01/02/2016 -- her PCP had gained about 3 years but she still persists in having problems during hyperbaric oxygen therapy with some discomfort in the ears. 01/09/16; pressure area with underlying osteomyelitis in the left buttock. Wound bed itself has some slight amount of grayish surface slough however I do not think any debridement was necessary. There is no exposed bone soft tissue appears stable. She is using a wound VAC 01/16/16; back for weekly wound review in conjunction with HBO. She has a deep wound over the left initial tuberosity previously treated with 6 weeks of IV antibiotics for osteomyelitis. Wound bed looks reasonably healthy although the base of this is still precariously close to bone. She has been using a wound VAC. 01/23/2016 -- she has completed her course of antibiotics and this week the only new thing is her right great toe nail was avulsed and she has got an open wound over the nailbed. 01/31/16 she has completed her course of antibiotics. Her right great toenail  avulsed last week and she's been using silver alginate for this as well. 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 Did not sound like there was any optimistic discussion regarding reconstruction and the net result would be to continue with the wound VAC application. I will await the official reports. Addendum: -- she was seen at [facility] surgery [facility] by Dr. Tressa Busman. After a thorough review and from what I understand spending 45 minutes with the patient his assessment has been noted by me in detail and the management options were: 1. Continued pressure offloading and wound care versus operative procedures including wound excision 2. Soft tissue and bone sampling 3. If the wound gets larger wound closure would be done using a variety of plastic surgical techniques including but not limited to skin substitute, possible skin graft, local versus regional flaps, negative pressure dressing application. 4. He discussed with her details of flap surgery and the risks associated 5. He made a comment that since the patient was operated on by Dr. Leland Johns of [facility] plastic surgery unit in [place] the patient may continue to follow-up there for further evaluation for surgical flap closure in the future. 03/19/2016 -- the patient continues to be rather depressed and frustrated  with her lack of rapid progress in healing this wound especially because she thought after hyperbaric oxygen therapy the wound would heal extremely fast. She now understands that was not the implied benefit on wound care which was the recommendation for hyperbaric oxygen therapy. I have had a lengthy discussion with the patient and her husband regarding her  options: 1. Continue with collagen and wound VAC for the primary dressing and offloading and all supportive care. 2. See Dr. Iran Planas for possible placement of Acell or Integra in the OR. 3. get a second opinion from a wound care center and surrounding regions/counties 05/07/2016 -- Note from Dr. Celedonio Miyamoto, who noted that the patient has declined flap surgery. She has discussed application of A cell, and try a few applications to see how the wound progresses. She is also recommended that we could apply products here in the wound center, like Oasis. during her preop workup it was found that her hemoglobin A1c was 11% and she has now been diagnosed as having diabetes mellitus and has been put on appropriate treatment by her PCP 05/28/2016 -- tells me her blood sugars have been doing well and she has an appointment to see her PCP in the next couple of weeks to check her hemoglobin A1c. Other than that she continues to do well. 06/25/2016 -- have not seen her back for the last month but she says her health has been about the same and she has an appointment to check the A1c next week 09/10/16 ---- was seen by Dr. Celedonio Miyamoto -- who applied Acell and saw her back in follow-up. She has recommended silver alginate to the wound every other day and cover with foam. If no significant drainage could transition to collagen every other day. She recommended discontinuing wound VAC. There were no plans to repeat application of Acell. The patient expressed that her husband could do the wound care as going to the Wound Ctr., would cost several $100 for each visit. 10/21/2016 -- her insurance company is getting her new mattress and she is pleased about that. Other than that she has been doing dressings with PolyMem Silver and has been doing very well 02/18/2017 -- she has gone through several changes of her mattress and has not been pleased with any of them. The ventricles are still working on trying to  fit her with the appropriate low air-loss mattress. She has a new wound on the gluteal area which is clearly separated from the original wound. 03/25/17-she is here in follow-up evaluation for her left ischialpressure ulcer. She remains unsatisfied with her pressure mattress. She admits to sitting multiple hours a day, in the bed. We have discussed offloading options. The wound does not appear infected. Nutrition does not appear to be a concern. Will follow-up in 4 weeks, if wound continues to be stalled may consider x-ray to evaluate for refractory osteomyelitis. 04/21/17; this is a patient that I don't know all that well. She has a chronic wound which at one point had underlying osteomyelitis in the left ischial tuberosity. This is a stage IV pressure ulcer. Over the last 3 months she has a stage II wound inferiorly to the original wound. The last time she was here her dressing was changed to silver collagen although the patient's husband who changes the dressing said that the collagen stuck to the wound and remove skin from the superficial area therefore he switched back to Anderson 05/13/17; this is a patient we've been following for a left  ischial tuberosity wound which was stage IV at one point had underlying osteomyelitis. Over the last several months she's had a stage II wound just inferior and medial to the related to the wound. According to her husband he is using Endoform layer with collagen although this is not what I had last time. According to her husband they are using Elgie Congo with collagen although I don't quite know how that started. She was hospitalized from 1/20 through 04/30/16. This was related to a UTI. Her blood cultures were negative, urine culture showed multiple species. She did have a CT scan of the abdomen and pelvis which documented chronic osteomyelitis in the area of the wound inflammatory markers were unremarkable. She has had prior knowledge of osteomyelitis. It  looks as though she received IV antibiotics in 2017 and was treated with a course of hyperbaric oxygen. 05/28/17; the wound over the left ischial tuberosity is deeper today and abuts clearly on bone. Nursing intake reported drainage. I therefore culture of the wound. The more superficial area just below this looks about the same. They once again complained that there are mattress cover is not working although apparently [facility] is been noted to see this many times in the report is that the device is functional 06/18/17; the patient had a probing area on the left ischial tuberosity that was draining purulent fluid last time. This also clearly seemed to have open bone. Culture I did showed pansensitive pseudomonas including third generation cephalosporins. I treated this with cefdinir 300 twice a day for 10 days and things seem to have improved. She has a more superficial wound just underneath this area. Amazingly she has a new air mattress through [facility]. I think they gave this to her as a parking give. In any case this now works according to the patient may have something to do with why the areas are looking better. 07/09/17; the patient has a probing area in the left ischial tuberosity that still has some depth. However this is contracted in terms of the wound orifice although the depth is still roughly the same. There is no undermining. She also has the satellite wound which is more superficial. This appears to have a healthy surface we've been using silver collagen 08/06/17; the patient's wound is over the left ischial tuberosity and a satellite lesion just underneath this. The original wound was actually a deep stage 4 wound. We have made good progress in 2 months and there is no longer exposed bone here. 09/03/17; left ischial tuberosity actually appears to be quite healthy. I think we are making progress. No debridement is required. There is no surrounding erythema 10/01/17 I  follow this patient monthly for her left ischial tuberosity wound. There is 2 areas the original area and a satellite area. The satellite area looks a lot better there is no surrounding erythema. Her husband relates that he is having trouble maintaining the dressing. This has to do with the soft tissue around it. He states he puts the collagen in but he cannot make sure that it stays in even with the ABD pads and tape that he is been using 10/29/17; patient arrives with a better looking noon today. Some of the satellite lesions have closed. using Prisma 11/26/17; the patient has a large cone-shaped area with the tip of the Cone deep within her buttock soft tissue. The walls of the Cone are epithelialized however the base is still open. The area at the base of this looks  moist we've been using silver collagen. Will change to silver alginate 12/31/2017; the wound appears to have come in fairly nicely. Using silver alginate. There is no surrounding maceration or infection 01/28/18; there is still an open area here over the left initial tuberosity. Base of this however looks healthy. There is no surrounding infection 02/25/18; the area of its open is over the left ischial tuberosity. The base of this is where the wound is. This is a large inverted cone-shaped area with the wound at the tip. Dimensions of the wound at the tip are improved. There is a area of denuded skin about halfway towards the tip which her husband thinks may have happened today when he was bathing her. 04/20/17; the area is still open over the left initial tuberosity. 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 This is now completely closed over although it is closed over with a divot and skin over bone at the base of this. Continued aggressive  offloading will be necessary. 12/30/2018 READMISSION This is a 70 year old woman with chronic paraplegia. I picked her up for her care from Dr. Con Memos in this clinic after he departed. She had a stage IV pressure wound over the left ischial tuberosity. She was treated twice for her underlying osteomyelitis and this I believe firstly in 2016 and again in 2017. There were some plans at some point for flap closure of this however she was discovered to have uncontrolled diabetes and I do not think this was ever accomplished. She ultimately healed over in this clinic and was discharged in May. She has a large cone-shaped indentation with the tip of this going towards the left ischial tuberosity. It is not an easy area to examine but at that time I thought all of this was epithelialized. Apparently there was a reopening here shortly after she left the clinic last time. She was admitted to hospital at the end of June for Klebsiella bacteremia felt to be secondary to UTI. A CT scan of the pelvis is listed below and there was initially some concern that she had underlying osteomyelitis although I believe she was seen by infectious disease and that was felt to be not the case: I do not see any new cultures or inflammatory markers IMPRESSION: 1. No CT evidence for acute intra-abdominal or pelvic abnormality. Large volume of stool throughout the colon. 2. Enlarged fatty liver with fat sparing near the gallbladder fossa 3. Cortical scarring right kidney. Bilateral intrarenal stones without hydronephrosis. Thick-walled urinary bladder decompressed by suprapubic catheter 4. Deep left decubitus ulcer with underlying left ischial changes suggesting osteomyelitis. Her husband has been using silver collagen in the wound. She has not been systemically unwell no fever chills eating and drinking well. They rigorously offload this wound only getting up in the wheelchair when she is going to appointments the rest of the  time she is in bed. 10/8; wound measures larger and she now has exposed bone. We have been using silver alginate 11/12 still using silver alginate. The patient saw Dr. Megan Salon of infectious disease. She was started on Augmentin 500 mg twice daily. She is due to follow-up with Dr. Megan Salon I believe next week. Lab work Dr. Megan Salon requested showed a sedimentation rate of 28 and CRP of 20 although her CRP 1 year ago  was 18.8. Sedimentation rate 1 year ago was 11 basic metabolic panel showed a creatinine of 1.12 12/3; the patient followed up with Dr. Megan Salon yesterday. She is still on Augmentin twice daily. This was directed by Dr. Megan Salon. The patient's inflammatory markers have improved which is gratifying. Her C-reactive protein was repeated yesterday and follow-up booked with infectious disease in January. In addition I have been getting secure text messages I think from palliative care through [facility] [employer]. I think they were hoping to provide services to the patient in her home. They could not get a hold of the primary physician and so they reached out to me on 2 separate occasions. 12/17; patient last saw Dr. Megan Salon on 12/2. She is finishing up with Augmentin. Her C-reactive protein was 20 on 10/21, 10.1 on 11/19 and 17 on 12/2. The wound itself still has depth and undermining. We are using Santyl with the backing wet-to-dry 04/27/2019. The wound is gradually clearing up in terms of the surface although it is not filled in that much. Still abuts right against bone 2/4; patient with a deep pressure ulcer over the left ischial tuberosity. I thought she was going to follow-up with infectious disease to follow her inflammatory markers although the patient states that they stated that they did not need to see her unless we felt it was necessary. I will need to check their notes. In any case we ordered moistened silver collagen back with wet-to-dry to fill in the depth of  the wound although apparently [facility] sent silver alginate which they have been using since they were here the last time. Is obviously not what we ordered. 2/25. Not much change in this wound it is over the left ischial tuberosity recurrent wound. We have been using silver collagen with backing wet-to-dry. I think the wound is about the same. There is still some tunneling from about 10-12 o'clock over the ischial tuberosity itself 3/11; pressure ulcer over the left ischial tuberosity. Since she was last here the wound VAC was started and apparently going quite well. We are able to get the home health company that accepts [place] healthcare which is in itself sometimes problematic. There is been improvements in the wound the tunneling seems to be better and is contracted nicely 4/8; 1 month follow-up. Since she was last here we have been using silver collagen under a wound VAC. Some minor contraction I think in wound volume. She is cared for diligently by her husband including pressure relief, incontinence management, nutritional support etc. 6/1; this is almost a 26-monthfollow-up. She is been using silver collagen under wound VAC. Circular area over the left ischial tuberosity. She has been using silver collagen under wound VAC 7/8; 1 month follow-up. Silver collagen under the VAC not really a lot of progress. Tissue at the base of the wound which is right against bone and the tissue next that this does not look completely viable. She is not currently on any antibiotics, she had underlying osteomyelitis I need to look this over 8/16; we are using silver collagen under wound VAC to the left ischial tuberosity wound. Comes in today with absolutely no change in surface area or depth. There is no exposed bone. I did look over her infectious disease notes as I said I would do last time. She last saw Dr. CMegan Salonin December 2020. She completed 6 weeks of Augmentin. This was in response to a bone culture I  did showing methicillin susceptible staph aureus  and Enterococcus. She was supposed to come back to see Dr. Megan Salon at some point although they say that that appointment was canceled unless I chose to recommend return. I think there was supposed to be follow-up with inflammatory markers but I cannot see that that was ever done. She has not been on antibiotics since 9/21; monthly follow-up. We received a call from home health nurse last evening to report green drainage coming out of the wound. Lab work I ordered last time showed a white count of 5.2 a sedimentation rate of 45 and a C-reactive protein of 25 however neither one of the 2 values are substantially different from her previous values in October 2020 or December 2020. Both are slightly higher but only marginally. Otherwise no new complaints from the patient or her husband 10/19; 1 month follow-up. PCR culture I did last time showed medium quantities of Pseudomonas lower quantities Klebsiella and Enterococcus faecalis group B strep and Peptostreptococcus. I gave her Augmentin for 2 weeks. I am not really sure of my choice of this I would not cover Pseudomonas. She is still having green drainage. Wound itself looks satisfactory there is not a lot of depth wound bed looks healthy 11/16; patient has completed the antibiotics still using gentamicin and silver alginate on the wound. There is improvement in the surface area 12/21; in general the area on the buttock looks somewhat better. Surface looks healthy although I do not know that there is been much improvement in the wound volume. We have been using silver alginate and Hydrofera Blue. Less drainage. In passing the husband showed me an abrasion injury on the left anterior tibia. Covered in necrotic surface. He has noticed this for about a week and has been putting silver collagen on it. He is completely uncertain about how this happened 1/25; monthly follow-up. The area on the left buttock is  about the same. This does not go to bone but a fairly deep wound surface of the wound is of questionable viability. The abrasion injury that they showed me last time apparently was closed out by home health because they thought it was healed but certainly is not although it is just about healed. As a result they haven't been applying anything to this area Finally I did discuss with the patient and her husband the idea of an advanced treatment product to try and get a proper base to this wound I was thinking of Puraply however actually the patient points out that her co-pay for coming to visit [place] i.e. the facility, charge would be unaffordable if they have to, on a weekly basis 2/22; pressure ulcer on the left buttock appears deeper to me and abuts on the ischial tuberosity. I thought initially there was exposed bone but there is a rim of tissue over this area. She also has a superficial over the right anterior mid tibia. Been using silver collagen to these areas without much success. I have looked over the patient's past history with regards to the area on the left ischium. She did have underlying osteomyelitis here dating back I think to late 2020. She saw Dr. Megan Salon she received a 6-week course of oral antibiotics in response to a bone culture that I did. This does not appear to be infected but it certainly has not been improving in terms of granulation. I do not believe she has had any recent imaging studies 3/29; 1 month follow-up. Pressure ulcer on the left buttock which she has been dealing with with  for a number of years. She was treated for underlying osteomyelitis at 1.2 or 3 years ago I think with infectious disease help. She also had I think a flap closure by Dr. Leland Johns and that lasted for about a year and then reopened. I have not been able to get this patient to progress towards healing although truthfully the wound is absolutely no worse. We have been using Hydrofera Blue 4/26;  patient presents for 1 week follow-up. She has been using silver collagen to the area every other day. She has home health that comes out once a week to help with dressing changes as well. The patient is interested in trying a skin substitute over this area. She states she is trying to relieve pressure off of it most of the day. 5/24; patient presents for 1 month follow-up. She has been using silver collagen to the area every other day. She has no complaints today. She is interested in the skin substitute. She tries to leave relieve pressure off Her bottom however is not able to most of the day 6/14; using silver collagen to the area over the left ischial tuberosity. Wound does not appear to be doing particularly well. Open to bone 6/28; the patient patient presented last time with a marked deterioration. Depth probing all the way to bone. The bone itself did not look particularly viable. In spite of this the x-ray I did showed longstanding ulcer over the left ischial tuberosity with chronic bone involvement/reaction that was also seen by CT in 2020. Lab work did not show just active infection with a sed rate of 14 and a C-reactive protein of 7.1. Her comprehensive metabolic panel was normal including an albumin of 4.1 white count was 6 7/19; patient was here 3 weeks ago with a marked deterioration in her wound over the left ischial tuberosity. I ordered a CT scan of the area for 1 reason or another this just did not get done. It is now booked for 8 days from now. She was supposed to come back for bone culture and pathology. That did not happen either. We have been using silver collagen. As usual she is diligently looked after by her husband 8/24; 5-week follow-up. Since the patient was last here the biopsy that I did of her ischial tuberosity came back suggesting osteomyelitis. Culture showed strep. I started her on Augmentin. She was seen by infectious disease Dr. Lucianne Lei dam and the Augmentin was  continued and she is still taking it. CT scan did not suggest anything other than chronic osteomyelitis without much change from her previous study. Her C-reactive protein was only 7 and sedimentation rate was 1.1. We are still using silver collagen to the wound. She has home health. Her husband is very diligent in her care for this reason I have never pursued a diverting colostomy. She would not agree to this surgery in any case. Finally we have discussed plastic surgery with him in the past and she is not interested in a myocutaneous flap. 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 Since she was last here she was in the ER last week with abdominal pain. She was found to be impacted however in the course of the review there somebody gave her some IV morphine and apparently she developed hives and blisters. There is still tense blisters on her plantar left fifth and fourth fingers 10/4; the patient has completed her Augmentin and is due to follow-up with Dr. Drucilla Schmidt in early November. Her wound  today measures about the same but looks a little healthier in terms of granulation there is no exposed bone. I note that she was admitted to hospital for 2 days from 9/21 through 9/23 for delirium. She had received Versed for glaucoma surgery ultimately that was felt to be the etiology. Her blood cultures were negative she had 30,000 colonies of Pseudomonas in her urine. She did receive broad-spectrum antibiotic therapy but ultimately her wound on the buttock was not felt to be the cause. As mentioned she has completed her antibiotics still using silver collagen 11/1; patient has completed her antibiotics for the underlying osteomyelitis. She apparently follows up with Dr. Drucilla Schmidt next week. The wound does not look too bad perhaps slightly narrower in terms of width but the depth is about the same. We have been using Hydrofera Blue. The patient talks to me about a wound VAC and made it sound as though she was  recently on 1 although I do not see this. The other option is an advanced treatment product like Oasis but that means weekly trips into the clinic. She has previously said she does not want an attempt at plastic surgery 11/15; the patient saw Dr. Drucilla Schmidt noted that her follow-up inflammatory markers normalized. She has completed her antibiotics. We are currently using Hydrofera Blue. Wound itself has some surface tissue over bone but certainly not a lot. I have looked back over her history. The patient has had recurrent osteomyelitis in this area as she is received prolonged courses of antibiotics. We did use a wound VAC for a prolonged period of time in 2021. We had some improvement especially in undermining areas but overall not a lot of measurable improvement. Once again I have tried to think about using advanced treatment products in this area something that would require them visiting the clinic very frequently and they did not seem to want to do this. Most recently we ran [place]rish however she would have a $937 per application co-pay Electronic Signature(s) Signed: 02/19/2021 4:37:23 PM By: Linton Ham MD Entered By: Linton Ham on 02/19/2021 12:26:31 -------------------------------------------------------------------------------- Physical Exam Details Patient Name: Date of Service: [name] 02/19/2021 10:00 A M Medical Record Number: [id number] Patient Account Number: [id number] Date of Birth/Sex: Treating RN: 04-01-1951 (70 y.o. [name], [name] Primary Care Provider: [name] Other Clinician: Referring Provider: Treating Provider/Extender: Tommy Rainwater in Treatment: 111 Constitutional Sitting or standing Blood Pressure is within target range for patient.. Pulse regular and within target range for patient.Marland Kitchen Respirations regular, non-labored and within target range.. Temperature is normal and within the target range for the patient.Marland Kitchen Appears in no  distress. Notes Wound exam; I think about the same as when I saw her 2 weeks ago. She does not have a lot of undermining. There is no exposed bone but certainly not a lot of tissue over the top of it. The area itself is in the middle of a deep divot in the area. No evidence of surrounding infection Electronic Signature(s) Signed: 02/19/2021 4:37:23 PM By: Linton Ham MD Entered By: Linton Ham on 02/19/2021 12:28:49 -------------------------------------------------------------------------------- Physician Orders Details Patient Name: Date of Service: [name], [name]. 02/19/2021 10:00 A M Medical Record Number: [id number] Patient Account Number: [id number] Date of Birth/Sex: Treating RN: 1951-02-19 (70 y.o. Sue Lush Primary Care Provider: [name] Other Clinician: Referring Provider: Treating Provider/Extender: Tommy Rainwater in Treatment: 111 Verbal / Phone Orders: No Diagnosis Coding ICD-10 Coding Code Description  F81.829 Pressure ulcer of left buttock, stage 4 E11.622 Type 2 diabetes mellitus with other skin ulcer M86.68 Other chronic osteomyelitis, other site Follow-up Appointments Return appointment in 1 month. - with Dr. Dellia Nims Other: - Has follow up with ID 02/13/21=ID states no further antibiotics needed Cellular or Tissue Based Products Cellular or Tissue Based Product Type: - Will run IVR for Oasis Skin Sub=$325 CoPay Bathing/ Shower/ Hygiene May shower and wash wound with soap and water. - with dressing changes Edema Control - Lymphedema / SCD / Other Elevate legs to the level of the heart or above for 30 minutes daily and/or when sitting, a frequency of: Avoid standing for long periods of time. Off-Loading Turn and reposition every 2 hours Home Health No change in wound care orders this week; continue Home Health for wound care. May utilize formulary equivalent dressing for wound treatment orders unless otherwise specified. Other  Home Health Orders/Instructions: - [facility] Wound Treatment Wound #12 - Ischial Tuberosity Wound Laterality: Left Cleanser: Soap and Water (Home Health) 1 x Per Day/30 Days Discharge Instructions: May shower and wash wound with dial antibacterial soap and water prior to dressing change. Cleanser: Wound Cleanser (Home Health) 1 x Per Day/30 Days Discharge Instructions: Cleanse the wound with wound cleanser prior to applying a clean dressing using gauze sponges, not tissue or cotton balls. Peri-Wound Care: Skin Prep (Home Health) 1 x Per Day/30 Days Discharge Instructions: Use skin prep as directed Prim Dressing: Hydrofera Blue Ready Foam, 2.5 x2.5 in 1 x Per Day/30 Days ary Discharge Instructions: cut to fit into wound Secondary Dressing: ComfortFoam Border, 4x4 in (silicone border) ([place]) 1 x Per Day/30 Days Discharge Instructions: Apply over primary dressing as directed. Electronic Signature(s) Signed: 02/19/2021 4:37:23 PM By: Linton Ham MD Signed: 02/19/2021 4:38:08 PM By: Lorrin Jackson Entered By: Lorrin Jackson on 02/19/2021 11:21:58 -------------------------------------------------------------------------------- Problem List Details Patient Name: Date of Service: [name], [name]. 02/19/2021 10:00 A M Medical Record Number: [id number] Patient Account Number: [id number] Date of Birth/Sex: Treating RN: 19-Apr-1950 (70 y.o. Sue Lush Primary Care Provider: [name] Other Clinician: Referring Provider: Treating Provider/Extender: Tommy Rainwater in Treatment: 111 Active Problems ICD-10 Encounter Code Description Active Date MDM Diagnosis 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, stage 4 12/30/2018 No Yes E11.622 Type 2 diabetes mellitus with other skin ulcer 12/30/2018 No Yes M86.68 Other chronic osteomyelitis, other site 11/28/2020 No Yes Inactive Problems ICD-10 Code Description Active Date Inactive Date G82.20 Paraplegia, unspecified  12/30/2018 12/30/2018 M86.68 Other chronic osteomyelitis, other site 02/17/2019 02/17/2019 S80.811D Abrasion, right lower leg, subsequent encounter 03/27/2020 03/27/2020 L97.811 Non-pressure chronic ulcer of other part of right lower leg limited to breakdown of skin 03/27/2020 03/27/2020 Resolved Problems Electronic Signature(s) Signed: 02/19/2021 4:37:23 PM By: Linton Ham MD Previous Signature: 02/19/2021 11:06:52 AM Version By: Lorrin Jackson Entered By: Linton Ham on 02/19/2021 12:21:15 -------------------------------------------------------------------------------- Progress Note Details Patient Name: Date of Service: [name] 02/19/2021 10:00 A M Medical Record Number: [id number] Patient Account Number: [id number] Date of Birth/Sex: Treating RN: 05-15-1950 (70 y.o. [name], [name] Primary Care Provider: [name] Other Clinician: Referring Provider: Treating Provider/Extender: Tommy Rainwater in Treatment: 111 Subjective History of Present Illness (HPI) The following HPI elements were documented for the patient's wound: Location: open ulceration of the left gluteal area, left heel and right ankle for about 5 months. Quality: Patient reports No Pain. Severity: Patient states wound(s) are getting worse. Duration: Patient has had the  wound for > 5 months prior to seeking treatment at the wound center Context: The wound occurred when the patient has been paraplegic for about 3 years. Modifying Factors: Wound improving due to current treatment. Associated Signs and Symptoms: Patient reports having foul odor. this 70 year old patient who is known to have hypertension, hypothyroidism, breast cancer, chronic pain syndrome, paraplegia was noted to have a left gluteal decubitus ulcer and was brought into the hospital. During the course of her hospitalization she was debrided in the operating room by ankle wound. Bone cultures were taken at that time  but were negative but clinically she was treated for osteomyelitis because of the probing down to bone and open exposed bone. Home health has been giving her antibioticss which include vancomycin and Zosyn. The patient was a smoker until about 3 weeks ago and used to smoke about 10 cigarettes a day for a long while. 12/13/2014 - details of her operative note from 11/03/2014 were reviewed -- PROCEDURE: 1. Excisional debridement skin, subcutaneous, muscle left ischium 35 cm2 2. Excisional debridement skin, subcutaneous tissue left heel 27 cm2 3. Excisional debridement right ankle skin, subcutaneous, bone 30 cm2 01/24/2015 -- she has some issues with her wheelchair cushion but other than that is doing very well and has received Podus boots for her feet. 02/14/2015 -- she was using her old offloading boots and this seemed to have caused her a new pressure ulcer on the left posterior heel near the superior part just below the Achilles tendon. 03/07/2015 -- she has a new ulceration just to the left of the midline on her sacral region more on the left buttock and this has been there for Dr. Leland Johns and had all the wounds sharply debrided. The debridement was done for the left ischial wound, the left heel wound and the right about a week. 08/22/2015 -- was recently admitted to hospital between May 5 and 08/13/2015, with sepsis and leukocytosis due to a UTI. she was treated for a sepsis complicating Escherichia coli UTI and kidney stones. She also had metabolic and careful up at the secondary to pyelonephritis. He received broad-spectrum antibiotics initially and then received Macrobid as per urology. She was sent home on nitrofurantoin. during her admission she had a CT scan which showed exposed left ischial tuberosity without evidence of osteolysis. 09/12/2015-- the patient is having some issues with her air mattress and would like to get a opinion from medical modalities. 10/10/2015 -- the issue with her  air mattress has not yet been sorted out and the new problem seems to be a lot of odor from the wound VAC. 11/27/2015 -- the patient was admitted to the hospital between July 23 and 10/31/2015. Her problems were sepsis, osteomyelitis of the pelvic bone and acute pyelonephritis. CT of the abdomen and pelvis was consistent with a left-sided pyelonephritis with hydronephrosis and also just showed new sclerosis of the posterior portion of the left anterior pubic ramus suggestive of periosteal reaction consistent with osteomyelitis. She was treated for the osteomyelitis with infectious disease consult recommending 6 weeks of IV antibiotics including vancomycin and Rocephin and the antibiotics were to go on until 12/10/2015. He was seen by Dr. Iran Planas plastic surgery and Dr. Linus Salmons of infectious disease. She had a suprapubic catheter placed during the admission. CT scan done on 10/28/2015 showed specifically -- New sclerosis of the posterior portion of the left inferior pubic ramus with aggressive periosteal reaction, consistent with osteomyelitis, with adjacent soft tissue gas compatible with previously described decubitus ulcer.  12/12/2015 -- she was recently seen by Dr. Linus Salmons, who noted good improvement and CRP and ESR compared to before and he has stopped her antibiotic as per plans to finish on September 4. The patient was encouraged to continue with wound care and consider hyperbaric oxygen therapy. Today she tells me that she has consented to undergo hyperbaric oxygen therapy and we can start the paperwork. 01/02/2016 -- her PCP had gained about 3 years but she still persists in having problems during hyperbaric oxygen therapy with some discomfort in the ears. 01/09/16; pressure area with underlying osteomyelitis in the left buttock. Wound bed itself has some slight amount of grayish surface slough however I do not think any debridement was necessary. There is no exposed bone soft tissue appears  stable. She is using a wound VAC 01/16/16; back for weekly wound review in conjunction with HBO. She has a deep wound over the left initial tuberosity previously treated with 6 weeks of IV antibiotics for osteomyelitis. Wound bed looks reasonably healthy although the base of this is still precariously close to bone. She has been using a wound VAC. 01/23/2016 -- she has completed her course of antibiotics and this week the only new thing is her right great toe nail was avulsed and she has got an open wound over the nailbed. 01/31/16 she has completed her course of antibiotics. Her right great toenail avulsed last week and she's been using silver alginate for this as well. Still using a wound VAC to the substantial stage IV wound over the left ischial tuberosity 03/05/2016 -- the patient has had a opinion from the plastic surgery group at [facility] and details of this are not available yet but the patient's verbal report has been heard by me. Did not sound like there was any optimistic discussion regarding reconstruction and the net result would be to continue with the wound VAC application. I will await the official reports. Addendum: -- she was seen at [facility] surgery service by Dr. Tressa Busman. After a thorough review and from what I understand spending 45 minutes with the patient his assessment has been noted by me in detail and the management options were: 1. Continued pressure offloading and wound care versus operative procedures including wound excision 2. Soft tissue and bone sampling 3. If the wound gets larger wound closure would be done using a variety of plastic surgical techniques including but not limited to skin substitute, possible skin graft, local versus regional flaps, negative pressure dressing application. 4. He discussed with her details of flap surgery and the risks associated 5. He made a comment that since the patient was operated on by Dr.  Leland Johns of [facility] plastic surgery unit in [place] the patient may continue to follow-up there for further evaluation for surgical flap closure in the future. 03/19/2016 -- the patient continues to be rather depressed and frustrated with her lack of rapid progress in healing this wound especially because she thought after hyperbaric oxygen therapy the wound would heal extremely fast. She now understands that was not the implied benefit on wound care which was the recommendation for hyperbaric oxygen therapy. I have had a lengthy discussion with the patient and her husband regarding her options: 1. Continue with collagen and wound VAC for the primary dressing and offloading and all supportive care. 2. See Dr. Iran Planas for possible placement of Acell or Integra in the OR. 3. get a second opinion from a wound care center and surrounding  regions/counties 05/07/2016 -- Note from Dr. Celedonio Miyamoto, who noted that the patient has declined flap surgery. She has discussed application of A cell, and try a few applications to see how the wound progresses. She is also recommended that we could apply products here in the wound center, like Oasis. during her preop workup it was found that her hemoglobin A1c was 11% and she has now been diagnosed as having diabetes mellitus and has been put on appropriate treatment by her PCP 05/28/2016 -- tells me her blood sugars have been doing well and she has an appointment to see her PCP in the next couple of weeks to check her hemoglobin A1c. Other than that she continues to do well. 06/25/2016 -- have not seen her back for the last month but she says her health has been about the same and she has an appointment to check the A1c next week 09/10/16 ---- was seen by Dr. Celedonio Miyamoto -- who applied Acell and saw her back in follow-up. She has recommended silver alginate to the wound every other day and cover with foam. If no significant drainage could  transition to collagen every other day. She recommended discontinuing wound VAC. There were no plans to repeat application of Acell. The patient expressed that her husband could do the wound care as going to the Wound Ctr., would cost several $100 for each visit. 10/21/2016 -- her insurance company is getting her new mattress and she is pleased about that. Other than that she has been doing dressings with PolyMem Silver and has been doing very well 02/18/2017 -- she has gone through several changes of her mattress and has not been pleased with any of them. The ventricles are still working on trying to fit her with the appropriate low air-loss mattress. She has a new wound on the gluteal area which is clearly separated from the original wound. 03/25/17-she is here in follow-up evaluation for her left ischialpressure ulcer. She remains unsatisfied with her pressure mattress. She admits to sitting multiple hours a day, in the bed. We have discussed offloading options. The wound does not appear infected. Nutrition does not appear to be a concern. Will follow-up in 4 weeks, if wound continues to be stalled may consider x-ray to evaluate for refractory osteomyelitis. 04/21/17; this is a patient that I don't know all that well. She has a chronic wound which at one point had underlying osteomyelitis in the left ischial tuberosity. This is a stage IV pressure ulcer. Over the last 3 months she has a stage II wound inferiorly to the original wound. The last time she was here her dressing was changed to silver collagen although the patient's husband who changes the dressing said that the collagen stuck to the wound and remove skin from the superficial area therefore he switched back to [place] 05/13/17; this is a patient we've been following for a left ischial tuberosity wound which was stage IV at one point had underlying osteomyelitis. Over the last several months she's had a stage II wound just inferior and  medial to the related to the wound. According to her husband he is using Endoform layer with collagen although this is not what I had last time. According to her husband they are using Elgie [place] with collagen although I don't quite know how that started. She was hospitalized from 1/20 through 04/30/16. This was related to a UTI. Her blood cultures were negative, urine culture showed multiple species. She did have a CT scan  of the abdomen and pelvis which documented chronic osteomyelitis in the area of the wound inflammatory markers were unremarkable. She has had prior knowledge of osteomyelitis. It looks as though she received IV antibiotics in 2017 and was treated with a course of hyperbaric oxygen. 05/28/17; the wound over the left ischial tuberosity is deeper today and abuts clearly on bone. Nursing intake reported drainage. I therefore culture of the wound. The more superficial area just below this looks about the same. They once again complained that there are mattress cover is not working although apparently advanced Homecare is been noted to see this many times in the report is that the device is functional 06/18/17; the patient had a probing area on the left ischial tuberosity that was draining purulent fluid last time. This also clearly seemed to have open bone. Culture I did showed pansensitive pseudomonas including third generation cephalosporins. I treated this with cefdinir 300 twice a day for 10 days and things seem to have improved. She has a more superficial wound just underneath this area. Amazingly she has a new air mattress through [facility]. I think they gave this to her as a parking give. In any case this now works according to the patient may have something to do with why the areas are looking better. 07/09/17; the patient has a probing area in the left ischial tuberosity that still has some depth. However this is contracted in terms of the wound orifice although the  depth is still roughly the same. There is no undermining. ooShe also has the satellite wound which is more superficial. This appears to have a healthy surface we've been using silver collagen 08/06/17; the patient's wound is over the left ischial tuberosity and a satellite lesion just underneath this. The original wound was actually a deep stage 4 wound. We have made good progress in 2 months and there is no longer exposed bone here. 09/03/17; left ischial tuberosity actually appears to be quite healthy. I think we are making progress. No debridement is required. There is no surrounding erythema 10/01/17 I follow this patient monthly for her left ischial tuberosity wound. There is 2 areas the original area and a satellite area. The satellite area looks a lot better there is no surrounding erythema. Her husband relates that he is having trouble maintaining the dressing. This has to do with the soft tissue around it. He states he puts the collagen in but he cannot make sure that it stays in even with the ABD pads and tape that he is been using 10/29/17; patient arrives with a better looking noon today. Some of the satellite lesions have closed. using Prisma 11/26/17; the patient has a large cone-shaped area with the tip of the Cone deep within her buttock soft tissue. The walls of the Cone are epithelialized however the base is still open. The area at the base of this looks moist we've been using silver collagen. Will change to silver alginate 12/31/2017; the wound appears to have come in fairly nicely. Using silver alginate. There is no surrounding maceration or infection 01/28/18; there is still an open area here over the left initial tuberosity. Base of this however looks healthy. There is no surrounding infection 02/25/18; the area of its open is over the left ischial tuberosity. The base of this is where the wound is. This is a large inverted cone-shaped area with the wound at the tip. Dimensions of the  wound at the tip are improved. There is a area  of denuded skin about halfway towards the tip which her husband thinks may have happened today when he was bathing her. 04/20/17; the area is still open over the left initial tuberosity. 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 This is now completely closed over although it is closed over with a divot and skin over bone at the base of this. Continued aggressive offloading will be necessary. 12/30/2018 READMISSION This is a 70 year old woman with chronic paraplegia. I picked her up for her care from Dr. Con Memos in this clinic after he departed. She had a stage IV pressure wound over the left ischial tuberosity. She was treated twice for her underlying osteomyelitis and this I believe firstly in 2016 and again in 2017. There were some plans at some point for flap closure of this however she was discovered to have uncontrolled diabetes and I do not think this was ever accomplished. She ultimately healed over in this clinic and was discharged in May. She has a large cone-shaped indentation with the tip of this going towards the left ischial tuberosity. It is not an easy area to examine but at that time I thought all of this was epithelialized. Apparently there was a reopening here shortly after she left the clinic last time. She was admitted to hospital at the end of June for Klebsiella bacteremia felt to be secondary to UTI. A CT scan of the pelvis is listed below and there was initially some concern that she had underlying osteomyelitis although I believe she was seen by infectious disease and that was felt to be not the case: I do not see any new cultures or inflammatory markers IMPRESSION: 1. No CT evidence for acute intra-abdominal or pelvic abnormality. Large  volume of stool throughout the colon. 2. Enlarged fatty liver with fat sparing near the gallbladder fossa 3. Cortical scarring right kidney. Bilateral intrarenal stones without hydronephrosis. Thick-walled urinary bladder decompressed by suprapubic catheter 4. Deep left decubitus ulcer with underlying left ischial changes suggesting osteomyelitis. Her husband has been using silver collagen in the wound. She has not been systemically unwell no fever chills eating and drinking well. They rigorously offload this wound only getting up in the wheelchair when she is going to appointments the rest of the time she is in bed. 10/8; wound measures larger and she now has exposed bone. We have been using silver alginate 11/12 still using silver alginate. The patient saw Dr. Megan Salon of infectious disease. She was started on Augmentin 500 mg twice daily. She is due to follow-up with Dr. Megan Salon I believe next week. Lab work Dr. Megan Salon requested showed a sedimentation rate of 28 and CRP of 20 although her CRP 1 year ago was 18.8. Sedimentation rate 1 year ago was 11 basic metabolic panel showed a creatinine of 1.12 12/3; the patient followed up with Dr. Megan Salon yesterday. She is still on Augmentin twice daily. This was directed by Dr. Megan Salon. The patient's inflammatory markers have improved which is gratifying. Her C-reactive protein was repeated yesterday and follow-up booked with infectious disease in January. In addition I have been getting secure text messages I think from palliative care through [facility] [employer]. I think they were hoping to provide services to the patient in her home. They could not get a hold  of the primary physician and so they reached out to me on 2 separate occasions. 12/17; patient last saw Dr. Megan Salon on 12/2. She is finishing up with Augmentin. Her C-reactive protein was 20 on 10/21, 10.1 on 11/19 and 17 on 12/2. The wound itself still has depth and  undermining. We are using Santyl with the backing wet-to-dry 04/27/2019. The wound is gradually clearing up in terms of the surface although it is not filled in that much. Still abuts right against bone 2/4; patient with a deep pressure ulcer over the left ischial tuberosity. I thought she was going to follow-up with infectious disease to follow her inflammatory markers although the patient states that they stated that they did not need to see her unless we felt it was necessary. I will need to check their notes. In any case we ordered moistened silver collagen back with wet-to-dry to fill in the depth of the wound although apparently [facility] sent silver alginate which they have been using since they were here the last time. Is obviously not what we ordered. 2/25. Not much change in this wound it is over the left ischial tuberosity recurrent wound. We have been using silver collagen with backing wet-to-dry. I think the wound is about the same. There is still some tunneling from about 10-12 o'clock over the ischial tuberosity itself 3/11; pressure ulcer over the left ischial tuberosity. Since she was last here the wound VAC was started and apparently going quite well. We are able to get the home health company that accepts [place] healthcare which is in itself sometimes problematic. There is been improvements in the wound the tunneling seems to be better and is contracted nicely 4/8; 1 month follow-up. Since she was last here we have been using silver collagen under a wound VAC. Some minor contraction I think in wound volume. She is cared for diligently by her husband including pressure relief, incontinence management, nutritional support etc. 6/1; this is almost a 68-month follow-up. She is been using silver collagen under wound VAC. Circular area over the left ischial tuberosity. She has been using silver collagen under wound VAC 7/8; 1 month follow-up. Silver collagen under the VAC not really a lot of  progress. Tissue at the base of the wound which is right against bone and the tissue next that this does not look completely viable. She is not currently on any antibiotics, she had underlying osteomyelitis I need to look this over 8/16; we are using silver collagen under wound VAC to the left ischial tuberosity wound. Comes in today with absolutely no change in surface area or depth. There is no exposed bone. I did look over her infectious disease notes as I said I would do last time. She last saw Dr. Megan Salon in December 2020. She completed 6 weeks of Augmentin. This was in response to a bone culture I did showing methicillin susceptible staph aureus and Enterococcus. She was supposed to come back to see Dr. Megan Salon at some point although they say that that appointment was canceled unless I chose to recommend return. I think there was supposed to be follow-up with inflammatory markers but I cannot see that that was ever done. She has not been on antibiotics since 9/21; monthly follow-up. We received a call from home health nurse last evening to report green drainage coming out of the wound. Lab work I ordered last time showed a white count of 5.2 a sedimentation rate of 45 and a C-reactive protein of 25 however neither  one of the 2 values are substantially different from her previous values in October 2020 or December 2020. Both are slightly higher but only marginally. Otherwise no new complaints from the patient or her husband 10/19; 1 month follow-up. PCR culture I did last time showed medium quantities of Pseudomonas lower quantities Klebsiella and Enterococcus faecalis group B strep and Peptostreptococcus. I gave her Augmentin for 2 weeks. I am not really sure of my choice of this I would not cover Pseudomonas. She is still having green drainage. Wound itself looks satisfactory there is not a lot of depth wound bed looks healthy 11/16; patient has completed the antibiotics still using gentamicin  and silver alginate on the wound. There is improvement in the surface area 12/21; in general the area on the buttock looks somewhat better. Surface looks healthy although I do not know that there is been much improvement in the wound volume. We have been using silver alginate and Hydrofera Blue. Less drainage. In passing the husband showed me an abrasion injury on the left anterior tibia. Covered in necrotic surface. He has noticed this for about a week and has been putting silver collagen on it. He is completely uncertain about how this happened 1/25; monthly follow-up. The area on the left buttock is about the same. This does not go to bone but a fairly deep wound surface of the wound is of questionable viability. The abrasion injury that they showed me last time apparently was closed out by home health because they thought it was healed but certainly is not although it is just about healed. As a result they haven't been applying anything to this area Finally I did discuss with the patient and her husband the idea of an advanced treatment product to try and get a proper base to this wound I was thinking of Puraply however actually the patient points out that her co-pay for coming to visit [place] i.e. the facility, charge would be unaffordable if they have to, on a weekly basis 2/22; pressure ulcer on the left buttock appears deeper to me and abuts on the ischial tuberosity. I thought initially there was exposed bone but there is a rim of tissue over this area. She also has a superficial over the right anterior mid tibia. Been using silver collagen to these areas without much success. I have looked over the patient's past history with regards to the area on the left ischium. She did have underlying osteomyelitis here dating back I think to late 2020. She saw Dr. Megan Salon she received a 6-week course of oral antibiotics in response to a bone culture that I did. This does not appear to be infected but  it certainly has not been improving in terms of granulation. I do not believe she has had any recent imaging studies 3/29; 1 month follow-up. Pressure ulcer on the left buttock which she has been dealing with with for a number of years. She was treated for underlying osteomyelitis at 1.2 or 3 years ago I think with infectious disease help. She also had I think a flap closure by Dr. Leland Johns and that lasted for about a year and then reopened. I have not been able to get this patient to progress towards healing although truthfully the wound is absolutely no worse. We have been using Hydrofera Blue 4/26; patient presents for 1 week follow-up. She has been using silver collagen to the area every other day. She has home health that comes out once a week to help  with dressing changes as well. The patient is interested in trying a skin substitute over this area. She states she is trying to relieve pressure off of it most of the day. 5/24; patient presents for 1 month follow-up. She has been using silver collagen to the area every other day. She has no complaints today. She is interested in the skin substitute. She tries to leave relieve pressure off Her bottom however is not able to most of the day 6/14; using silver collagen to the area over the left ischial tuberosity. Wound does not appear to be doing particularly well. Open to bone 6/28; the patient patient presented last time with a marked deterioration. Depth probing all the way to bone. The bone itself did not look particularly viable. In spite of this the x-ray I did showed longstanding ulcer over the left ischial tuberosity with chronic bone involvement/reaction that was also seen by CT in 2020. Lab work did not show just active infection with a sed rate of 14 and a C-reactive protein of 7.1. Her comprehensive metabolic panel was normal including an albumin of 4.1 white count was 6 7/19; patient was here 3 weeks ago with a marked deterioration in  her wound over the left ischial tuberosity. I ordered a CT scan of the area for 1 reason or another this just did not get done. It is now booked for 8 days from now. She was supposed to come back for bone culture and pathology. That did not happen either. We have been using silver collagen. As usual she is diligently looked after by her husband 8/24; 5-week follow-up. Since the patient was last here the biopsy that I did of her ischial tuberosity came back suggesting osteomyelitis. Culture showed strep. I started her on Augmentin. She was seen by infectious disease Dr. Lucianne Lei dam and the Augmentin was continued and she is still taking it. CT scan did not suggest anything other than chronic osteomyelitis without much change from her previous study. Her C-reactive protein was only 7 and sedimentation rate was 1.1. We are still using silver collagen to the wound. She has home health. Her husband is very diligent in her care for this reason I have never pursued a diverting colostomy. She would not agree to this surgery in any case. Finally we have discussed plastic surgery with him in the past and she is not interested in a myocutaneous flap. 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 Since she was last here she was in the ER last week with abdominal pain. She was found to be impacted however in the course of the review there somebody gave her some IV morphine and apparently she developed hives and blisters. There is still tense blisters on her plantar left fifth and fourth fingers 10/4; the patient has completed her Augmentin and is due to follow-up with Dr. Drucilla Schmidt in early November. Her wound today measures about the same but looks a little healthier in terms of granulation there is no exposed bone. I note that she was admitted to hospital for 2 days from 9/21 through 9/23 for delirium. She had received Versed for glaucoma surgery ultimately that was felt to be the etiology. Her blood cultures were negative  she had 30,000 colonies of Pseudomonas in her urine. She did receive broad-spectrum antibiotic therapy but ultimately her wound on the buttock was not felt to be the cause. As mentioned she has completed her antibiotics still using silver collagen 11/1; patient has completed her antibiotics  for the underlying osteomyelitis. She apparently follows up with Dr. Drucilla Schmidt next week. The wound does not look too bad perhaps slightly narrower in terms of width but the depth is about the same. We have been using Hydrofera Blue. The patient talks to me about a wound VAC and made it sound as though she was recently on 1 although I do not see this. The other option is an advanced treatment product like Oasis but that means weekly trips into the clinic. She has previously said she does not want an attempt at plastic surgery 11/15; the patient saw Dr. Drucilla Schmidt noted that her follow-up inflammatory markers normalized. She has completed her antibiotics. We are currently using Hydrofera Blue. Wound itself has some surface tissue over bone but certainly not a lot. I have looked back over her history. The patient has had recurrent osteomyelitis in this area as she is received prolonged courses of antibiotics. We did use a wound VAC for a prolonged period of time in 2021. We had some improvement especially in undermining areas but overall not a lot of measurable improvement. Once again I have tried to think about using advanced treatment products in this area something that would require them visiting the clinic very frequently and they did not seem to want to do this. Most recently we ran [place] however she would have a $562 per application co-pay Objective Constitutional Sitting or standing Blood Pressure is within target range for patient.. Pulse regular and within target range for patient.Marland Kitchen Respirations regular, non-labored and within target range.. Temperature is normal and within the target range for the patient.Marland Kitchen  Appears in no distress. Vitals Time Taken: 10:36 AM, Height: 63 in, Weight: 185 lbs, BMI: 32.8, Temperature: 97.7 F, Pulse: 97 bpm, Respiratory Rate: 18 breaths/min, Blood Pressure: 144/72 mmHg. General Notes: Wound exam; I think about the same as when I saw her 2 weeks ago. She does not have a lot of undermining. There is no exposed bone but certainly not a lot of tissue over the top of it. The area itself is in the middle of a deep divot in the area. No evidence of surrounding infection Integumentary (Hair, Skin) Wound #12 status is Open. Original cause of wound was Pressure Injury. The date acquired was: 09/06/2018. The wound has been in treatment 111 weeks. The wound is located on the Left Ischial Tuberosity. The wound measures 2.5cm length x 1cm width x 1.8cm depth; 1.963cm^2 area and 3.534cm^3 volume. There is Fat Layer (Subcutaneous Tissue) exposed. There is no tunneling or undermining noted. There is a medium amount of serosanguineous drainage noted. The wound margin is well defined and not attached to the wound base. There is large (67-100%) red, pink granulation within the wound bed. There is a small (1-33%) amount of necrotic tissue within the wound bed including Adherent Slough. 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 - with Dr. Dellia Nims Other: - Has follow up with ID 02/13/21=ID states no further antibiotics needed Cellular or Tissue Based Products: Cellular or Tissue Based Product Type: - Will run IVR for Oasis Skin Sub=$325 [employer] Bathing/ Shower/ Hygiene: May shower and wash wound with soap and water. - with dressing changes Edema Control - Lymphedema / SCD / Other: Elevate legs to the level of the heart or above for 30 minutes daily and/or when sitting, a frequency of: Avoid standing  for long  periods of time. Off-Loading: Turn and reposition every 2 hours Home Health: No change in wound care orders this week; continue Home Health for wound care. May utilize formulary equivalent dressing for wound treatment orders unless otherwise specified. Other Home Health Orders/Instructions: - [facility] WOUND #12: - Ischial Tuberosity Wound Laterality: Left Cleanser: Soap and Water (Home Health) 1 x Per Day/30 Days Discharge Instructions: May shower and wash wound with dial antibacterial soap and water prior to dressing change. Cleanser: Wound Cleanser (Home Health) 1 x Per Day/30 Days Discharge Instructions: Cleanse the wound with wound cleanser prior to applying a clean dressing using gauze sponges, not tissue or cotton balls. Peri-Wound Care: Skin Prep (Home Health) 1 x Per Day/30 Days Discharge Instructions: Use skin prep as directed Prim Dressing: Hydrofera Blue Ready Foam, 2.5 x2.5 in 1 x Per Day/30 Days ary Discharge Instructions: cut to fit into wound Secondary Dressing: ComfortFoam Border, 4x4 in (silicone border) ([place]) 1 x Per Day/30 Days Discharge Instructions: Apply over primary dressing as directed. #1 I continued with Hydrofera Blue. 2. There is no option for a skin sub here. The co-pay is unaffordable and the patient is unwilling to come into the office more frequently in any case. 3. With regards to the wound VAC there was some improvement in 6579 but certainly not major. Apparently after I left the room the patient stated she did not want a wound VAC again and therefore I will not pursue this further until I can talk to her which will be in a month. 4. She is previously refused any consideration of plastic surgery referral stating she had a bad experience in the past. I do not have that firmly in our records 5. This may be a wound that is not going to heal for many reasons. 6. I will see her again in a month Electronic Signature(s) Signed: 02/19/2021 4:37:23 PM By:  Linton Ham MD Entered By: Linton Ham on 02/19/2021 12:31:00 -------------------------------------------------------------------------------- SuperBill Details Patient Name: Date of Service: [name], [name]. 02/19/2021 Medical Record Number: [id number] Patient Account Number: [id number] Date of Birth/Sex: Treating RN: Feb 08, 1951 (70 y.o. Sue Lush Primary Care Provider: [name] Other Clinician: Referring Provider: Treating Provider/Extender: Tommy Rainwater in Treatment: 111 Diagnosis Coding ICD-10 Codes Code Description [phone] Pressure ulcer of left buttock, stage 4 E11.622 Type 2 diabetes mellitus with other skin ulcer M86.68 Other chronic osteomyelitis, other site Facility Procedures The patient participates with Medicare or their insurance follows the Medicare Facility Guidelines: CPT4 Code Description Modifier Quantity [id number] [place] VISIT-LEV 3 EST PT 1 Physician Procedures : CPT4 Code Description Modifier [id number] - WC PHYS LEVEL 3 - EST PT ICD-10 Diagnosis Description 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, stage 4 E11.622 Type 2 diabetes mellitus with other skin ulcer M86.68 Other chronic osteomyelitis, other site Quantity: 1 Electronic Signature(s) Signed: 02/19/2021 4:37:23 PM By: Linton Ham MD Entered By: Linton Ham on 02/19/2021 12:31:45

## 2021-02-21 NOTE — Progress Notes (Signed)
MISAKI, SOZIO ([id number]) Visit Report for 02/19/2021 Arrival Information Details Patient Name: Date of Service: [name], [name]. 02/19/2021 10:00 A M Medical Record Number: [id number] Patient Account Number: [id number] Date of Birth/Sex: Treating RN: 29-Aug-1950 (70 y.o. Benjamine Sprague, Briant Cedar Primary Care Sayer Masini: [name] Other Clinician: Referring Charly Hunton: Treating Leotis Isham/Extender: Tommy Rainwater in Treatment: 64 Visit Information History Since Last Visit Added or deleted any medications: No Patient Arrived: Wheel Chair Any new allergies or adverse reactions: No Arrival Time: 10:36 Had a fall or experienced change in No Accompanied By: husband activities of daily living that may affect Transfer Assistance: None risk of falls: Patient Identification Verified: Yes Signs or symptoms of abuse/neglect since last visito No Secondary Verification Process Completed: Yes Hospitalized since last visit: No Patient Requires Transmission-Based Precautions: No Implantable device outside of the clinic excluding No Patient Has Alerts: No cellular tissue based products placed in the center since last visit: Has Dressing in Place as Prescribed: Yes Pain Present Now: No Electronic Signature(s) Signed: 02/21/2021 5:23:58 PM By: Levan Hurst RN, BSN Entered By: Levan Hurst on 02/19/2021 10:37:02 -------------------------------------------------------------------------------- Clinic Level of Care Assessment Details Patient Name: Date of Service: [name]. 02/19/2021 10:00 A M Medical Record Number: [id number] Patient Account Number: [id number] Date of Birth/Sex: Treating RN: [birth year] (70 y.o. [name] Primary Care Brezlyn Manrique: [name] Other Clinician: Referring Joletta Manner: Treating Sharni Negron/Extender: Tommy Rainwater in Treatment: 111 Clinic Level of Care Assessment Items TOOL 4 Quantity Score X- 1 0 Use when only an EandM  is performed on FOLLOW-UP visit ASSESSMENTS - Nursing Assessment / Reassessment X- 1 10 Reassessment of Co-morbidities (includes updates in patient status) X- 1 5 Reassessment of Adherence to Treatment Plan ASSESSMENTS - Wound and Skin A ssessment / Reassessment X - Simple Wound Assessment / Reassessment - one wound 1 5 []  - 0 Complex Wound Assessment / Reassessment - multiple wounds []  - 0 Dermatologic / Skin Assessment (not related to wound area) ASSESSMENTS - Focused Assessment []  - 0 Circumferential Edema Measurements - multi extremities []  - 0 Nutritional Assessment / Counseling / Intervention []  - 0 Lower Extremity Assessment (monofilament, tuning fork, pulses) []  - 0 Peripheral Arterial Disease Assessment (using hand held doppler) ASSESSMENTS - Ostomy and/or Continence Assessment and Care []  - 0 Incontinence Assessment and Management []  - 0 Ostomy Care Assessment and Management (repouching, etc.) PROCESS - Coordination of Care []  - 0 Simple Patient / Family Education for ongoing care X- 1 20 Complex (extensive) Patient / Family Education for ongoing care []  - 0 Staff obtains Programmer, systems, Records, T Results / Process Orders est X- 1 10 Staff telephones HHA, Nursing Homes / Clarify orders / etc []  - 0 Routine Transfer to another Facility (non-emergent condition) []  - 0 Routine Hospital Admission (non-emergent condition) []  - 0 New Admissions / Biomedical engineer / Ordering NPWT Apligraf, etc. , []  - 0 Emergency Hospital Admission (emergent condition) []  - 0 Simple Discharge Coordination []  - 0 Complex (extensive) Discharge Coordination PROCESS - Special Needs []  - 0 Pediatric / Minor Patient Management []  - 0 Isolation Patient Management []  - 0 Hearing / Language / Visual special needs []  - 0 Assessment of Community assistance (transportation, D/C planning, etc.) []  - 0 Additional assistance / Altered mentation []  - 0 Support Surface(s) Assessment  (bed, cushion, seat, etc.) INTERVENTIONS - Wound Cleansing / Measurement X - Simple Wound Cleansing - one wound 1 5 []  - 0 Complex Wound  Cleansing - multiple wounds X- 1 5 Wound Imaging (photographs - any number of wounds) []  - 0 Wound Tracing (instead of photographs) X- 1 5 Simple Wound Measurement - one wound []  - 0 Complex Wound Measurement - multiple wounds INTERVENTIONS - Wound Dressings []  - 0 Small Wound Dressing one or multiple wounds X- 1 15 Medium Wound Dressing one or multiple wounds []  - 0 Large Wound Dressing one or multiple wounds []  - 0 Application of Medications - topical []  - 0 Application of Medications - injection INTERVENTIONS - Miscellaneous []  - 0 External ear exam []  - 0 Specimen Collection (cultures, biopsies, blood, body fluids, etc.) []  - 0 Specimen(s) / Culture(s) sent or taken to Lab for analysis []  - 0 Patient Transfer (multiple staff / Civil Service fast streamer / Similar devices) []  - 0 Simple Staple / Suture removal (25 or less) []  - 0 Complex Staple / Suture removal (26 or more) []  - 0 Hypo / Hyperglycemic Management (close monitor of Blood Glucose) []  - 0 Ankle / Brachial Index (ABI) - do not check if billed separately X- 1 5 Vital Signs Has the patient been seen at the hospital within the last three years: Yes Total Score: 85 Level Of Care: New/Established - Level 3 Electronic Signature(s) Signed: 02/19/2021 4:38:08 PM By: Lorrin Jackson Entered By: Lorrin Jackson on 02/19/2021 11:29:14 -------------------------------------------------------------------------------- Encounter Discharge Information Details Patient Name: Date of Service: [name], [name] Posey M. 02/19/2021 10:00 A M Medical Record Number: [id number] Patient Account Number: [id number] Date of Birth/Sex: Treating RN: [birth year] (70 y.o. [name] Primary Care Tayler Lassen: [name] Other Clinician: Referring Vonzella Althaus: Treating Saphronia Ozdemir/Extender: Tommy Rainwater  in Treatment: 111 Encounter Discharge Information Items Discharge Condition: Stable Ambulatory Status: Wheelchair Discharge Destination: Home Transportation: Private Auto Accompanied ByRaechel Chute Schedule Follow-up Appointment: Yes Clinical Summary of Care: Provided on 02/19/2021 Form Type Recipient Paper Patient Patient Electronic Signature(s) Signed: 02/19/2021 11:34:23 AM By: Lorrin Jackson Entered By: Lorrin Jackson on 02/19/2021 11:34:23 -------------------------------------------------------------------------------- Lower Extremity Assessment Details Patient Name: Date of Service: [name]. 02/19/2021 10:00 A M Medical Record Number: [id number] Patient Account Number: [id number] Date of Birth/Sex: Treating RN: 1950-06-03 (70 y.o. [name] Primary Care Mar Walmer: [name] Other Clinician: Referring Tyshun Tuckerman: Treating Tiffine Henigan/Extender: Tommy Rainwater in Treatment: 111 Electronic Signature(s) Signed: 02/19/2021 11:06:33 AM By: Lorrin Jackson Entered By: Lorrin Jackson on 02/19/2021 11:06:32 -------------------------------------------------------------------------------- Multi Wound Chart Details Patient Name: Date of Service: [name], [name]. 02/19/2021 10:00 A M Medical Record Number: [id number] Patient Account Number: [id number] Date of Birth/Sex: Treating RN: [birth year] (70 y.o. [name], [name] Primary Care Erna Brossard: [name] Other Clinician: Referring Homero Hyson: Treating Ida Uppal/Extender: Tommy Rainwater in Treatment: 111 Vital Signs Height(in): 63 Pulse(bpm): 97 Weight(lbs): 185 Blood Pressure(mmHg): 144/72 Body Mass Index(BMI): 33 Temperature(F): 97.7 Respiratory Rate(breaths/min): 18 Photos: [N/A:N/A] Left Ischial Tuberosity N/A N/A Wound Location: Pressure Injury N/A N/A Wounding Event: Pressure Ulcer N/A N/A Primary Etiology: Anemia, Hypertension, Type II N/A N/A Comorbid  History: Diabetes, Osteoarthritis, Dementia, Paraplegia, Received Radiation 09/06/2018 N/A N/A Date Acquired: 111 N/A N/A Weeks of Treatment: Open N/A N/A Wound Status: 2.5x1x1.8 N/A N/A Measurements L x W x D (cm) 1.963 N/A N/A A (cm) : rea 3.534 N/A N/A Volume (cm) : -731.80% N/A N/A % Reduction in A rea: -1567.00% N/A N/A % Reduction in Volume: Category/Stage IV N/A N/A Classification: Medium N/A N/A Exudate A mount: Serosanguineous N/A N/A Exudate Type: red, brown N/A  N/A Exudate Color: Well defined, not attached N/A N/A Wound Margin: Large (67-100%) N/A N/A Granulation A mount: Red, Pink N/A N/A Granulation Quality: Small (1-33%) N/A N/A Necrotic A mount: Fat Layer (Subcutaneous Tissue): Yes N/A N/A Exposed Structures: Fascia: No Tendon: No Muscle: No Joint: No Bone: No Small (1-33%) N/A N/A Epithelialization: Treatment Notes Wound #12 (Ischial Tuberosity) Wound Laterality: Left Cleanser Soap and Water Discharge Instruction: May shower and wash wound with dial antibacterial soap and water prior to dressing change. Wound Cleanser Discharge Instruction: Cleanse the wound with wound cleanser prior to applying a clean dressing using gauze sponges, not tissue or cotton balls. Peri-Wound Care Skin Prep Discharge Instruction: Use skin prep as directed Topical Primary Dressing Hydrofera Blue Ready Foam, 2.5 x2.5 in Discharge Instruction: cut to fit into wound Secondary Dressing ComfortFoam Border, 4x4 in (silicone border) Discharge Instruction: Apply over primary dressing as directed. Secured With Compression Wrap Compression Stockings Environmental education officer) Signed: 02/19/2021 4:37:23 PM By: Linton Ham MD Signed: 02/21/2021 5:03:29 PM By: Rhae Hammock RN Entered By: Linton Ham on 02/19/2021 12:21:25 -------------------------------------------------------------------------------- Multi-Disciplinary Care Plan Details Patient  Name: Date of Service: [name], [name]. 02/19/2021 10:00 A M Medical Record Number: [id number] Patient Account Number: [id number] Date of Birth/Sex: Treating RN: April 01, 1951 (70 y.o. [name] Primary Care Samamtha Tiegs: [name] Other Clinician: Referring Aby Gessel: Treating Yarissa Reining/Extender: Tommy Rainwater in Treatment: 111 Active Inactive Wound/Skin Impairment Nursing Diagnoses: Knowledge deficit related to ulceration/compromised skin integrity Goals: Patient/caregiver will verbalize understanding of skin care regimen Date Initiated: 12/30/2018 Target Resolution Date: 03/05/2021 Goal Status: Active Interventions: Assess patient/caregiver ability to obtain necessary supplies Assess patient/caregiver ability to perform ulcer/skin care regimen upon admission and as needed Assess ulceration(s) every visit Provide education on ulcer and skin care Treatment Activities: Skin care regimen initiated : 12/30/2018 Topical wound management initiated : 12/30/2018 Notes: 01/08/21: Wound care regimen continues Electronic Signature(s) Signed: 02/19/2021 11:07:21 AM By: Lorrin Jackson Entered By: Lorrin Jackson on 02/19/2021 11:07:20 -------------------------------------------------------------------------------- Pain Assessment Details Patient Name: Date of Service: [name]. 02/19/2021 10:00 A M Medical Record Number: [id number] Patient Account Number: [id number] Date of Birth/Sex: Treating RN: 1950/12/26 (70 y.o. [name] Primary Care Veyda Kaufman: [name] Other Clinician: Referring Kory Rains: Treating Vianca Bracher/Extender: Tommy Rainwater in Treatment: 111 Active Problems Location of Pain Severity and Description of Pain Patient Has Paino No Site Locations Pain Management and Medication Current Pain Management: Electronic Signature(s) Signed: 02/21/2021 5:23:58 PM By: Levan Hurst RN, BSN Entered By: Levan Hurst on  02/19/2021 10:37:42 -------------------------------------------------------------------------------- Patient/Caregiver Education Details Patient Name: Date of Service: [name] 11/15/2022andnbsp10:00 [place] Record Number: [id number] Patient Account Number: [id number] Date of Birth/Gender: Treating RN: 04-09-1950 (70 y.o. [name] Primary Care Physician: [name] Other Clinician: Referring Physician: Treating Physician/Extender: Tommy Rainwater in Treatment: 111 Education Assessment Education Provided To: Patient and Caregiver Education Topics Provided Pressure: Methods: Explain/Verbal, Printed Responses: State content correctly Wound/Skin Impairment: Methods: Explain/Verbal, Printed Responses: State content correctly Electronic Signature(s) Signed: 02/19/2021 4:38:08 PM By: Lorrin Jackson Signed: 02/19/2021 4:38:08 PM By: Lorrin Jackson Entered By: Lorrin Jackson on 02/19/2021 11:08:32 -------------------------------------------------------------------------------- Wound Assessment Details Patient Name: Date of Service: [name]. 02/19/2021 10:00 A M Medical Record Number: [id number] Patient Account Number: [id number] Date of Birth/Sex: Treating RN: November 17, 1950 (70 y.o. [name] Primary Care Cadell Gabrielson: [name] Other Clinician: Referring Tiffannie Sloss: Treating Noelani Harbach/Extender: Tommy Rainwater  in Treatment: 111 Wound Status Wound Number: 12 Primary Pressure Ulcer Etiology: Wound Location: Left Ischial Tuberosity Wound Open Wounding Event: Pressure Injury Status: Date Acquired: 09/06/2018 Comorbid Anemia, Hypertension, Type II Diabetes, Osteoarthritis, Weeks Of Treatment: 111 History: Dementia, Paraplegia, Received Radiation Clustered Wound: No Photos Wound Measurements Length: (cm) 2.5 Width: (cm) 1 Depth: (cm) 1.8 Area: (cm) 1.963 Volume: (cm) 3.534 % Reduction in Area: -731.8% %  Reduction in Volume: -1567% Epithelialization: Small (1-33%) Tunneling: No Undermining: No Wound Description Classification: Category/Stage IV Wound Margin: Well defined, not attached Exudate Amount: Medium Exudate Type: Serosanguineous Exudate Color: red, brown Foul Odor After Cleansing: No Slough/Fibrino Yes Wound Bed Granulation Amount: Large (67-100%) Exposed Structure Granulation Quality: Red, Pink Fascia Exposed: No Necrotic Amount: Small (1-33%) Fat Layer (Subcutaneous Tissue) Exposed: Yes Necrotic Quality: Adherent Slough Tendon Exposed: No Muscle Exposed: No Joint Exposed: No Bone Exposed: No Treatment Notes Wound #12 (Ischial Tuberosity) Wound Laterality: Left Cleanser Soap and Water Discharge Instruction: May shower and wash wound with dial antibacterial soap and water prior to dressing change. Wound Cleanser Discharge Instruction: Cleanse the wound with wound cleanser prior to applying a clean dressing using gauze sponges, not tissue or cotton balls. Peri-Wound Care Skin Prep Discharge Instruction: Use skin prep as directed Topical Primary Dressing Hydrofera Blue Ready Foam, 2.5 x2.5 in Discharge Instruction: cut to fit into wound Secondary Dressing ComfortFoam Border, 4x4 in (silicone border) Discharge Instruction: Apply over primary dressing as directed. Secured With Compression Wrap Compression Stockings Environmental education officer) Signed: 02/21/2021 5:23:58 PM By: Levan Hurst RN, BSN Entered By: Levan Hurst on 02/19/2021 10:57:43 -------------------------------------------------------------------------------- [place] Details Patient Name: Date of Service: [name], [name]. 02/19/2021 10:00 A M Medical Record Number: [id number] Patient Account Number: [id number] Date of Birth/Sex: Treating RN: [birth year] (70 y.o. [name] Primary Care Yoona Ishii: [name] Other Clinician: Referring Shadonna Benedick: Treating Milta Croson/Extender:  Tommy Rainwater in Treatment: 111 Vital Signs Time Taken: 10:36 Temperature (F): 97.7 Height (in): 63 Pulse (bpm): 97 Weight (lbs): 185 Respiratory Rate (breaths/min): 18 Body Mass Index (BMI): 32.8 Blood Pressure (mmHg): 144/72 Reference Range: 80 - 120 mg / dl Electronic Signature(s) Signed: 02/21/2021 5:23:58 PM By: Levan Hurst RN, BSN Entered By: Levan Hurst on 02/19/2021 10:37:33

## 2021-03-13 ENCOUNTER — Other Ambulatory Visit: Payer: Self-pay

## 2021-03-13 ENCOUNTER — Encounter (HOSPITAL_BASED_OUTPATIENT_CLINIC_OR_DEPARTMENT_OTHER): Payer: Medicare Other | Attending: Internal Medicine | Admitting: Internal Medicine

## 2021-03-13 DIAGNOSIS — G8220 Paraplegia, unspecified: Secondary | ICD-10-CM | POA: Diagnosis not present

## 2021-03-13 DIAGNOSIS — L89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, stage 4: Secondary | ICD-10-CM | POA: Diagnosis not present

## 2021-03-13 DIAGNOSIS — E119 Type 2 diabetes mellitus without complications: Secondary | ICD-10-CM | POA: Diagnosis not present

## 2021-03-13 NOTE — Progress Notes (Signed)
[name], [name] ([id number]) Visit Report for 03/13/2021 HPI Details Patient Name: Date of Service: [name], [name] 03/13/2021 10:45 A M Medical Record Number: [id number] Patient Account Number: [id number] Date of Birth/Sex: Treating RN: Aug 23, 1950 (70 y.o. [name] Primary Care Provider: [name] Other Clinician: Referring Provider: Treating Provider/Extender: Tommy Rainwater in Treatment: 114 History of Present Illness Location: open ulceration of the left gluteal area, left heel and right ankle for about 5 months. Quality: Patient reports No Pain. Severity: Patient states wound(s) are getting worse. Duration: Patient has had the wound for > 5 months prior to seeking treatment at the wound center Context: The wound occurred when the patient has been paraplegic for about 3 years. Modifying Factors: Wound improving due to current treatment. ssociated Signs and Symptoms: Patient reports having foul odor. A HPI Description: this 70 year old patient who is known to have hypertension, hypothyroidism, breast cancer, chronic pain syndrome, paraplegia was noted to have a left gluteal decubitus ulcer and was brought into the hospital. During the course of her hospitalization she was debrided in the operating room by ankle wound. Bone cultures were taken at that time but were negative but clinically she was treated for osteomyelitis because of the probing down to bone and open exposed bone. Home health has been giving her antibioticss which include vancomycin and Zosyn. The patient was a smoker until about 3 weeks ago and used to smoke about 10 cigarettes a day for a long while. 12/13/2014 - details of her operative note from 11/03/2014 were reviewed -- PROCEDURE: 1. Excisional debridement skin, subcutaneous, muscle left ischium 35 cm2 2. Excisional debridement skin, subcutaneous tissue left heel 27 cm2 3. Excisional debridement right ankle skin, subcutaneous, bone 30  cm2 01/24/2015 -- she has some issues with her wheelchair cushion but other than that is doing very well and has received Podus boots for her feet. 02/14/2015 -- she was using her old offloading boots and this seemed to have caused her a new pressure ulcer on the left posterior heel near the superior part just below the Achilles tendon. 03/07/2015 -- she has a new ulceration just to the left of the midline on her sacral region more on the left buttock and this has been there for Dr. Leland Johns and had all the wounds sharply debrided. The debridement was done for the left ischial wound, the left heel wound and the right about a week. 08/22/2015 -- was recently admitted to hospital between May 5 and 08/13/2015, with sepsis and leukocytosis due to a UTI. she was treated for a sepsis complicating Escherichia coli UTI and kidney stones. She also had metabolic and careful up at the secondary to pyelonephritis. He received broad-spectrum antibiotics initially and then received Macrobid as per urology. She was sent home on nitrofurantoin. during her admission she had a CT scan which showed exposed left ischial tuberosity without evidence of osteolysis. 09/12/2015-- the patient is having some issues with her air mattress and would like to get a opinion from medical modalities. 10/10/2015 -- the issue with her air mattress has not yet been sorted out and the new problem seems to be a lot of odor from the wound VAC. 11/27/2015 -- the patient was admitted to the hospital between July 23 and 10/31/2015. Her problems were sepsis, osteomyelitis of the pelvic bone and acute pyelonephritis. CT of the abdomen and pelvis was consistent with a left-sided pyelonephritis with hydronephrosis and also just showed new sclerosis of the posterior portion of  the left anterior pubic ramus suggestive of periosteal reaction consistent with osteomyelitis. She was treated for the osteomyelitis with infectious disease consult  recommending 6 weeks of IV antibiotics including vancomycin and Rocephin and the antibiotics were to go on until 12/10/2015. He was seen by Dr. Iran Planas plastic surgery and Dr. Linus Salmons of infectious disease. She had a suprapubic catheter placed during the admission. CT scan done on 10/28/2015 showed specifically -- New sclerosis of the posterior portion of the left inferior pubic ramus with aggressive periosteal reaction, consistent with osteomyelitis, with adjacent soft tissue gas compatible with previously described decubitus ulcer. 12/12/2015 -- she was recently seen by Dr. Linus Salmons, who noted good improvement and CRP and ESR compared to before and he has stopped her antibiotic as per plans to finish on September 4. The patient was encouraged to continue with wound care and consider hyperbaric oxygen therapy. Today she tells me that she has consented to undergo hyperbaric oxygen therapy and we can start the paperwork. 01/02/2016 -- her PCP had gained about 3 years but she still persists in having problems during hyperbaric oxygen therapy with some discomfort in the ears. 01/09/16; pressure area with underlying osteomyelitis in the left buttock. Wound bed itself has some slight amount of grayish surface slough however I do not think any debridement was necessary. There is no exposed bone soft tissue appears stable. She is using a wound VAC 01/16/16; back for weekly wound review in conjunction with HBO. She has a deep wound over the left initial tuberosity previously treated with 6 weeks of IV antibiotics for osteomyelitis. Wound bed looks reasonably healthy although the base of this is still precariously close to bone. She has been using a wound VAC. 01/23/2016 -- she has completed her course of antibiotics and this week the only new thing is her right great toe nail was avulsed and she has got an open wound over the nailbed. 01/31/16 she has completed her course of antibiotics. Her right great toenail  avulsed last week and she's been using silver alginate for this as well. 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 Did not sound like there was any optimistic discussion regarding reconstruction and the net result would be to continue with the wound VAC application. I will await the official reports. Addendum: -- she was seen at [facility] surgery [facility] by Dr. Tressa Busman. After a thorough review and from what I understand spending 45 minutes with the patient his assessment has been noted by me in detail and the management options were: 1. Continued pressure offloading and wound care versus operative procedures including wound excision 2. Soft tissue and bone sampling 3. If the wound gets larger wound closure would be done using a variety of plastic surgical techniques including but not limited to skin substitute, possible skin graft, local versus regional flaps, negative pressure dressing application. 4. He discussed with her details of flap surgery and the risks associated 5. He made a comment that since the patient was operated on by Dr. Leland Johns of [facility] plastic surgery unit in [place] the patient may continue to follow-up there for further evaluation for surgical flap closure in the future. 03/19/2016 -- the patient continues to be rather depressed and frustrated  with her lack of rapid progress in healing this wound especially because she thought after hyperbaric oxygen therapy the wound would heal extremely fast. She now understands that was not the implied benefit on wound care which was the recommendation for hyperbaric oxygen therapy. I have had a lengthy discussion with the patient and her husband regarding her  options: 1. Continue with collagen and wound VAC for the primary dressing and offloading and all supportive care. 2. See Dr. Iran Planas for possible placement of Acell or Integra in the OR. 3. get a second opinion from a wound care center and surrounding regions/counties 05/07/2016 -- Note from Dr. Celedonio Miyamoto, who noted that the patient has declined flap surgery. She has discussed application of A cell, and try a few applications to see how the wound progresses. She is also recommended that we could apply products here in the wound center, like Oasis. during her preop workup it was found that her hemoglobin A1c was 11% and she has now been diagnosed as having diabetes mellitus and has been put on appropriate treatment by her PCP 05/28/2016 -- tells me her blood sugars have been doing well and she has an appointment to see her PCP in the next couple of weeks to check her hemoglobin A1c. Other than that she continues to do well. 06/25/2016 -- have not seen her back for the last month but she says her health has been about the same and she has an appointment to check the A1c next week 09/10/16 ---- was seen by Dr. Celedonio Miyamoto -- who applied Acell and saw her back in follow-up. She has recommended silver alginate to the wound every other day and cover with foam. If no significant drainage could transition to collagen every other day. She recommended discontinuing wound VAC. There were no plans to repeat application of Acell. The patient expressed that her husband could do the wound care as going to the Wound Ctr., would cost several $100 for each visit. 10/21/2016 -- her insurance company is getting her new mattress and she is pleased about that. Other than that she has been doing dressings with PolyMem Silver and has been doing very well 02/18/2017 -- she has gone through several changes of her mattress and has not been pleased with any of them. The ventricles are still working on trying to  fit her with the appropriate low air-loss mattress. She has a new wound on the gluteal area which is clearly separated from the original wound. 03/25/17-she is here in follow-up evaluation for her left ischialpressure ulcer. She remains unsatisfied with her pressure mattress. She admits to sitting multiple hours a day, in the bed. We have discussed offloading options. The wound does not appear infected. Nutrition does not appear to be a concern. Will follow-up in 4 weeks, if wound continues to be stalled may consider x-ray to evaluate for refractory osteomyelitis. 04/21/17; this is a patient that I don't know all that well. She has a chronic wound which at one point had underlying osteomyelitis in the left ischial tuberosity. This is a stage IV pressure ulcer. Over the last 3 months she has a stage II wound inferiorly to the original wound. The last time she was here her dressing was changed to silver collagen although the patient's husband who changes the dressing said that the collagen stuck to the wound and remove skin from the superficial area therefore he switched back to Anderson 05/13/17; this is a patient we've been following for a left  ischial tuberosity wound which was stage IV at one point had underlying osteomyelitis. Over the last several months she's had a stage II wound just inferior and medial to the related to the wound. According to her husband he is using Endoform layer with collagen although this is not what I had last time. According to her husband they are using Elgie Congo with collagen although I don't quite know how that started. She was hospitalized from 1/20 through 04/30/16. This was related to a UTI. Her blood cultures were negative, urine culture showed multiple species. She did have a CT scan of the abdomen and pelvis which documented chronic osteomyelitis in the area of the wound inflammatory markers were unremarkable. She has had prior knowledge of osteomyelitis. It  looks as though she received IV antibiotics in 2017 and was treated with a course of hyperbaric oxygen. 05/28/17; the wound over the left ischial tuberosity is deeper today and abuts clearly on bone. Nursing intake reported drainage. I therefore culture of the wound. The more superficial area just below this looks about the same. They once again complained that there are mattress cover is not working although apparently [facility] is been noted to see this many times in the report is that the device is functional 06/18/17; the patient had a probing area on the left ischial tuberosity that was draining purulent fluid last time. This also clearly seemed to have open bone. Culture I did showed pansensitive pseudomonas including third generation cephalosporins. I treated this with cefdinir 300 twice a day for 10 days and things seem to have improved. She has a more superficial wound just underneath this area. Amazingly she has a new air mattress through [facility]. I think they gave this to her as a parking give. In any case this now works according to the patient may have something to do with why the areas are looking better. 07/09/17; the patient has a probing area in the left ischial tuberosity that still has some depth. However this is contracted in terms of the wound orifice although the depth is still roughly the same. There is no undermining. She also has the satellite wound which is more superficial. This appears to have a healthy surface we've been using silver collagen 08/06/17; the patient's wound is over the left ischial tuberosity and a satellite lesion just underneath this. The original wound was actually a deep stage 4 wound. We have made good progress in 2 months and there is no longer exposed bone here. 09/03/17; left ischial tuberosity actually appears to be quite healthy. I think we are making progress. No debridement is required. There is no surrounding erythema 10/01/17 I  follow this patient monthly for her left ischial tuberosity wound. There is 2 areas the original area and a satellite area. The satellite area looks a lot better there is no surrounding erythema. Her husband relates that he is having trouble maintaining the dressing. This has to do with the soft tissue around it. He states he puts the collagen in but he cannot make sure that it stays in even with the ABD pads and tape that he is been using 10/29/17; patient arrives with a better looking noon today. Some of the satellite lesions have closed. using Prisma 11/26/17; the patient has a large cone-shaped area with the tip of the Cone deep within her buttock soft tissue. The walls of the Cone are epithelialized however the base is still open. The area at the base of this looks  moist we've been using silver collagen. Will change to silver alginate 12/31/2017; the wound appears to have come in fairly nicely. Using silver alginate. There is no surrounding maceration or infection 01/28/18; there is still an open area here over the left initial tuberosity. Base of this however looks healthy. There is no surrounding infection 02/25/18; the area of its open is over the left ischial tuberosity. The base of this is where the wound is. This is a large inverted cone-shaped area with the wound at the tip. Dimensions of the wound at the tip are improved. There is a area of denuded skin about halfway towards the tip which her husband thinks may have happened today when he was bathing her. 04/20/17; the area is still open over the left initial tuberosity. 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 This is now completely closed over although it is closed over with a divot and skin over bone at the base of this. Continued aggressive  offloading will be necessary. 12/30/2018 READMISSION This is a 70 year old woman with chronic paraplegia. I picked her up for her care from Dr. Con Memos in this clinic after he departed. She had a stage IV pressure wound over the left ischial tuberosity. She was treated twice for her underlying osteomyelitis and this I believe firstly in 2016 and again in 2017. There were some plans at some point for flap closure of this however she was discovered to have uncontrolled diabetes and I do not think this was ever accomplished. She ultimately healed over in this clinic and was discharged in May. She has a large cone-shaped indentation with the tip of this going towards the left ischial tuberosity. It is not an easy area to examine but at that time I thought all of this was epithelialized. Apparently there was a reopening here shortly after she left the clinic last time. She was admitted to hospital at the end of June for Klebsiella bacteremia felt to be secondary to UTI. A CT scan of the pelvis is listed below and there was initially some concern that she had underlying osteomyelitis although I believe she was seen by infectious disease and that was felt to be not the case: I do not see any new cultures or inflammatory markers IMPRESSION: 1. No CT evidence for acute intra-abdominal or pelvic abnormality. Large volume of stool throughout the colon. 2. Enlarged fatty liver with fat sparing near the gallbladder fossa 3. Cortical scarring right kidney. Bilateral intrarenal stones without hydronephrosis. Thick-walled urinary bladder decompressed by suprapubic catheter 4. Deep left decubitus ulcer with underlying left ischial changes suggesting osteomyelitis. Her husband has been using silver collagen in the wound. She has not been systemically unwell no fever chills eating and drinking well. They rigorously offload this wound only getting up in the wheelchair when she is going to appointments the rest of the  time she is in bed. 10/8; wound measures larger and she now has exposed bone. We have been using silver alginate 11/12 still using silver alginate. The patient saw Dr. Megan Salon of infectious disease. She was started on Augmentin 500 mg twice daily. She is due to follow-up with Dr. Megan Salon I believe next week. Lab work Dr. Megan Salon requested showed a sedimentation rate of 28 and CRP of 20 although her CRP 1 year ago  was 18.8. Sedimentation rate 1 year ago was 11 basic metabolic panel showed a creatinine of 1.12 12/3; the patient followed up with Dr. Megan Salon yesterday. She is still on Augmentin twice daily. This was directed by Dr. Megan Salon. The patient's inflammatory markers have improved which is gratifying. Her C-reactive protein was repeated yesterday and follow-up booked with infectious disease in January. In addition I have been getting secure text messages I think from palliative care through [facility] [employer]. I think they were hoping to provide services to the patient in her home. They could not get a hold of the primary physician and so they reached out to me on 2 separate occasions. 12/17; patient last saw Dr. Megan Salon on 12/2. She is finishing up with Augmentin. Her C-reactive protein was 20 on 10/21, 10.1 on 11/19 and 17 on 12/2. The wound itself still has depth and undermining. We are using Santyl with the backing wet-to-dry 04/27/2019. The wound is gradually clearing up in terms of the surface although it is not filled in that much. Still abuts right against bone 2/4; patient with a deep pressure ulcer over the left ischial tuberosity. I thought she was going to follow-up with infectious disease to follow her inflammatory markers although the patient states that they stated that they did not need to see her unless we felt it was necessary. I will need to check their notes. In any case we ordered moistened silver collagen back with wet-to-dry to fill in the depth of  the wound although apparently [facility] sent silver alginate which they have been using since they were here the last time. Is obviously not what we ordered. 2/25. Not much change in this wound it is over the left ischial tuberosity recurrent wound. We have been using silver collagen with backing wet-to-dry. I think the wound is about the same. There is still some tunneling from about 10-12 o'clock over the ischial tuberosity itself 3/11; pressure ulcer over the left ischial tuberosity. Since she was last here the wound VAC was started and apparently going quite well. We are able to get the home health company that accepts [place] healthcare which is in itself sometimes problematic. There is been improvements in the wound the tunneling seems to be better and is contracted nicely 4/8; 1 month follow-up. Since she was last here we have been using silver collagen under a wound VAC. Some minor contraction I think in wound volume. She is cared for diligently by her husband including pressure relief, incontinence management, nutritional support etc. 6/1; this is almost a 26-monthfollow-up. She is been using silver collagen under wound VAC. Circular area over the left ischial tuberosity. She has been using silver collagen under wound VAC 7/8; 1 month follow-up. Silver collagen under the VAC not really a lot of progress. Tissue at the base of the wound which is right against bone and the tissue next that this does not look completely viable. She is not currently on any antibiotics, she had underlying osteomyelitis I need to look this over 8/16; we are using silver collagen under wound VAC to the left ischial tuberosity wound. Comes in today with absolutely no change in surface area or depth. There is no exposed bone. I did look over her infectious disease notes as I said I would do last time. She last saw Dr. CMegan Salonin December 2020. She completed 6 weeks of Augmentin. This was in response to a bone culture I  did showing methicillin susceptible staph aureus  and Enterococcus. She was supposed to come back to see Dr. Megan Salon at some point although they say that that appointment was canceled unless I chose to recommend return. I think there was supposed to be follow-up with inflammatory markers but I cannot see that that was ever done. She has not been on antibiotics since 9/21; monthly follow-up. We received a call from home health nurse last evening to report green drainage coming out of the wound. Lab work I ordered last time showed a white count of 5.2 a sedimentation rate of 45 and a C-reactive protein of 25 however neither one of the 2 values are substantially different from her previous values in October 2020 or December 2020. Both are slightly higher but only marginally. Otherwise no new complaints from the patient or her husband 10/19; 1 month follow-up. PCR culture I did last time showed medium quantities of Pseudomonas lower quantities Klebsiella and Enterococcus faecalis group B strep and Peptostreptococcus. I gave her Augmentin for 2 weeks. I am not really sure of my choice of this I would not cover Pseudomonas. She is still having green drainage. Wound itself looks satisfactory there is not a lot of depth wound bed looks healthy 11/16; patient has completed the antibiotics still using gentamicin and silver alginate on the wound. There is improvement in the surface area 12/21; in general the area on the buttock looks somewhat better. Surface looks healthy although I do not know that there is been much improvement in the wound volume. We have been using silver alginate and Hydrofera Blue. Less drainage. In passing the husband showed me an abrasion injury on the left anterior tibia. Covered in necrotic surface. He has noticed this for about a week and has been putting silver collagen on it. He is completely uncertain about how this happened 1/25; monthly follow-up. The area on the left buttock is  about the same. This does not go to bone but a fairly deep wound surface of the wound is of questionable viability. The abrasion injury that they showed me last time apparently was closed out by home health because they thought it was healed but certainly is not although it is just about healed. As a result they haven't been applying anything to this area Finally I did discuss with the patient and her husband the idea of an advanced treatment product to try and get a proper base to this wound I was thinking of Puraply however actually the patient points out that her co-pay for coming to visit [place] i.e. the facility, charge would be unaffordable if they have to, on a weekly basis 2/22; pressure ulcer on the left buttock appears deeper to me and abuts on the ischial tuberosity. I thought initially there was exposed bone but there is a rim of tissue over this area. She also has a superficial over the right anterior mid tibia. Been using silver collagen to these areas without much success. I have looked over the patient's past history with regards to the area on the left ischium. She did have underlying osteomyelitis here dating back I think to late 2020. She saw Dr. Megan Salon she received a 6-week course of oral antibiotics in response to a bone culture that I did. This does not appear to be infected but it certainly has not been improving in terms of granulation. I do not believe she has had any recent imaging studies 3/29; 1 month follow-up. Pressure ulcer on the left buttock which she has been dealing with with  for a number of years. She was treated for underlying osteomyelitis at 1.2 or 3 years ago I think with infectious disease help. She also had I think a flap closure by Dr. Leland Johns and that lasted for about a year and then reopened. I have not been able to get this patient to progress towards healing although truthfully the wound is absolutely no worse. We have been using Hydrofera Blue 4/26;  patient presents for 1 week follow-up. She has been using silver collagen to the area every other day. She has home health that comes out once a week to help with dressing changes as well. The patient is interested in trying a skin substitute over this area. She states she is trying to relieve pressure off of it most of the day. 5/24; patient presents for 1 month follow-up. She has been using silver collagen to the area every other day. She has no complaints today. She is interested in the skin substitute. She tries to leave relieve pressure off Her bottom however is not able to most of the day 6/14; using silver collagen to the area over the left ischial tuberosity. Wound does not appear to be doing particularly well. Open to bone 6/28; the patient patient presented last time with a marked deterioration. Depth probing all the way to bone. The bone itself did not look particularly viable. In spite of this the x-ray I did showed longstanding ulcer over the left ischial tuberosity with chronic bone involvement/reaction that was also seen by CT in 2020. Lab work did not show just active infection with a sed rate of 14 and a C-reactive protein of 7.1. Her comprehensive metabolic panel was normal including an albumin of 4.1 white count was 6 7/19; patient was here 3 weeks ago with a marked deterioration in her wound over the left ischial tuberosity. I ordered a CT scan of the area for 1 reason or another this just did not get done. It is now booked for 8 days from now. She was supposed to come back for bone culture and pathology. That did not happen either. We have been using silver collagen. As usual she is diligently looked after by her husband 8/24; 5-week follow-up. Since the patient was last here the biopsy that I did of her ischial tuberosity came back suggesting osteomyelitis. Culture showed strep. I started her on Augmentin. She was seen by infectious disease Dr. Lucianne Lei dam and the Augmentin was  continued and she is still taking it. CT scan did not suggest anything other than chronic osteomyelitis without much change from her previous study. Her C-reactive protein was only 7 and sedimentation rate was 1.1. We are still using silver collagen to the wound. She has home health. Her husband is very diligent in her care for this reason I have never pursued a diverting colostomy. She would not agree to this surgery in any case. Finally we have discussed plastic surgery with him in the past and she is not interested in a myocutaneous flap. 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 Since she was last here she was in the ER last week with abdominal pain. She was found to be impacted however in the course of the review there somebody gave her some IV morphine and apparently she developed hives and blisters. There is still tense blisters on her plantar left fifth and fourth fingers 10/4; the patient has completed her Augmentin and is due to follow-up with Dr. Drucilla Schmidt in early November. Her wound  today measures about the same but looks a little healthier in terms of granulation there is no exposed bone. I note that she was admitted to hospital for 2 days from 9/21 through 9/23 for delirium. She had received Versed for glaucoma surgery ultimately that was felt to be the etiology. Her blood cultures were negative she had 30,000 colonies of Pseudomonas in her urine. She did receive broad-spectrum antibiotic therapy but ultimately her wound on the buttock was not felt to be the cause. As mentioned she has completed her antibiotics still using silver collagen 11/1; patient has completed her antibiotics for the underlying osteomyelitis. She apparently follows up with Dr. Drucilla Schmidt next week. The wound does not look too bad perhaps slightly narrower in terms of width but the depth is about the same. We have been using Hydrofera Blue. The patient talks to me about a wound VAC and made it sound as though she was  recently on 1 although I do not see this. The other option is an advanced treatment product like Oasis but that means weekly trips into the clinic. She has previously said she does not want an attempt at plastic surgery 11/15; the patient saw Dr. Drucilla Schmidt noted that her follow-up inflammatory markers normalized. She has completed her antibiotics. We are currently using Hydrofera Blue. Wound itself has some surface tissue over bone but certainly not a lot. I have looked back over her history. The patient has had recurrent osteomyelitis in this area as she is received prolonged courses of antibiotics. We did use a wound VAC for a prolonged period of time in 2021. We had some improvement especially in undermining areas but overall not a lot of measurable improvement. Once again I have tried to think about using advanced treatment products in this area something that would require them visiting the clinic very frequently and they did not seem to want to do this. Most recently we ran [place] however she would have a $300 per application co-pay 76/2; left ischial tuberosity. I do not see much difference in 3 weeks. There is no exposed bone. We are using Hydrofera Blue. Our intake nurse reports some "greenish" drainage Electronic Signature(s) Signed: 03/13/2021 3:48:37 PM By: Linton Ham MD Entered By: Linton Ham on 03/13/2021 12:26:26 -------------------------------------------------------------------------------- Physical Exam Details Patient Name: Date of Service: [name]. 03/13/2021 10:45 A M Medical Record Number: [id number] Patient Account Number: [id number] Date of Birth/Sex: Treating RN: 1951/01/02 (71 y.o. [name] Primary Care Provider: [name] Other Clinician: Referring Provider: Treating Provider/Extender: Tommy Rainwater in Treatment: 114 Constitutional Sitting or standing Blood Pressure is within target range for patient.. Pulse regular and  within target range for patient.Marland Kitchen Respirations regular, non-labored and within target range.. Temperature is normal and within the target range for the patient.Marland Kitchen Appears in no distress. Notes Wound exam; not much change. There is still granulation tissue over bone but certainly no improvement in the depth. We are not making progress with increasing granulation. There is not much undermining or tunneling. I see no evidence of surrounding skin or soft tissue infection Electronic Signature(s) Signed: 03/13/2021 3:48:37 PM By: Linton Ham MD Entered By: Linton Ham on 03/13/2021 12:27:23 -------------------------------------------------------------------------------- Physician Orders Details Patient Name: Date of Service: [name]. 03/13/2021 10:45 A M Medical Record Number: [id number] Patient Account Number: [id number] Date of Birth/Sex: Treating RN: 09/03/1950 (70 y.o. [name] Primary Care Provider: Other Clinician: [name] Referring Provider: Treating Provider/Extender: Manfred Arch,  [name] in Treatment: 114 Verbal / Phone Orders: No Diagnosis Coding ICD-10 Coding Code Description 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, stage 4 E11.622 Type 2 diabetes mellitus with other skin ulcer M86.68 Other chronic osteomyelitis, other site Follow-up Appointments ppointment in 1 week. - Dr. Dellia Nims Return A Bathing/ Shower/ Hygiene May shower and wash wound with soap and water. - with dressing changes Edema Control - Lymphedema / SCD / Other Elevate legs to the level of the heart or above for 30 minutes daily and/or when sitting, a frequency of: Avoid standing for long periods of time. Off-Loading Turn and reposition every 2 hours [place] wound care orders this week; continue Home Health for wound care. May utilize formulary equivalent dressing for wound treatment orders unless otherwise specified. - Change primary dressing to silver  alginate Other Home Health Orders/Instructions: - [facility] Wound Treatment Wound #12 - Ischial Tuberosity Wound Laterality: Left Cleanser: Soap and Water ([place]) 1 x Per Day/30 Days Discharge Instructions: May shower and wash wound with dial antibacterial soap and water prior to dressing change. Cleanser: Wound Cleanser (Home Health) 1 x Per Day/30 Days Discharge Instructions: Cleanse the wound with wound cleanser prior to applying a clean dressing using gauze sponges, not tissue or cotton balls. Peri-Wound Care: Skin Prep (Home Health) 1 x Per Day/30 Days Discharge Instructions: Use skin prep as directed Prim Dressing: KerraCel Ag Gelling Fiber Dressing, 2x2 in (silver alginate) (Home Health) 1 x Per Day/30 Days ary Discharge Instructions: Apply silver alginate to wound bed as instructed Secondary Dressing: ComfortFoam Border, 4x4 in (silicone border) ([place]) 1 x Per Day/30 Days Discharge Instructions: Apply over primary dressing as directed. Laboratory naerobe culture (MICRO) - Left ischium - (ICD10 L89.324 - Pressure ulcer of left buttock, stage Bacteria identified in Unspecified specimen by A 4) LOINC Code: 161-0 Convenience Name: Anerobic culture Electronic Signature(s) Signed: 03/13/2021 3:48:37 PM By: Linton Ham MD Signed: 03/13/2021 5:24:14 PM By: Levan Hurst RN, BSN Entered By: Levan Hurst on 03/13/2021 11:32:37 -------------------------------------------------------------------------------- Problem List Details Patient Name: Date of Service: [name]. 03/13/2021 10:45 A M Medical Record Number: [id number] Patient Account Number: [id number] Date of Birth/Sex: Treating RN: 1951-03-07 (70 y.o. [name] Primary Care Provider: Other Clinician: [name] Referring Provider: Treating Provider/Extender: Tommy Rainwater in Treatment: 114 Active Problems ICD-10 Encounter Code Description Active Date  MDM Diagnosis 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, stage 4 12/30/2018 No Yes E11.622 Type 2 diabetes mellitus with other skin ulcer 12/30/2018 No Yes Inactive Problems ICD-10 Code Description Active Date Inactive Date G82.20 Paraplegia, unspecified 12/30/2018 12/30/2018 M86.68 Other chronic osteomyelitis, other site 02/17/2019 02/17/2019 S80.811D Abrasion, right lower leg, subsequent encounter 03/27/2020 03/27/2020 L97.811 Non-pressure chronic ulcer of other part of right lower leg limited to breakdown of skin 03/27/2020 03/27/2020 M86.68 Other chronic osteomyelitis, other site 11/28/2020 11/28/2020 Resolved Problems Electronic Signature(s) Signed: 03/13/2021 3:48:37 PM By: Linton Ham MD Entered By: Linton Ham on 03/13/2021 12:24:01 -------------------------------------------------------------------------------- Progress Note Details Patient Name: Date of Service: [name]. 03/13/2021 10:45 A M Medical Record Number: [id number] Patient Account Number: [id number] Date of Birth/Sex: Treating RN: 11-Dec-1950 (70 y.o. [name] Primary Care Provider: [name] Other Clinician: Referring Provider: Treating Provider/Extender: Tommy Rainwater in Treatment: 114 Subjective History of Present Illness (HPI) The following HPI elements were documented for the patient's wound: Location: open ulceration of the left gluteal area, left heel and right ankle for about  5 months. Quality: Patient reports No Pain. Severity: Patient states wound(s) are getting worse. Duration: Patient has had the wound for > 5 months prior to seeking treatment at the wound center Context: The wound occurred when the patient has been paraplegic for about 3 years. Modifying Factors: Wound improving due to current treatment. Associated Signs and Symptoms: Patient reports having foul odor. this 70 year old patient who is known to have hypertension, hypothyroidism, breast cancer,  chronic pain syndrome, paraplegia was noted to have a left gluteal decubitus ulcer and was brought into the hospital. During the course of her hospitalization she was debrided in the operating room by ankle wound. Bone cultures were taken at that time but were negative but clinically she was treated for osteomyelitis because of the probing down to bone and open exposed bone. Home health has been giving her antibioticss which include vancomycin and Zosyn. The patient was a smoker until about 3 weeks ago and used to smoke about 10 cigarettes a day for a long while. 12/13/2014 - details of her operative note from 11/03/2014 were reviewed -- PROCEDURE: 1. Excisional debridement skin, subcutaneous, muscle left ischium 35 cm2 2. Excisional debridement skin, subcutaneous tissue left heel 27 cm2 3. Excisional debridement right ankle skin, subcutaneous, bone 30 cm2 01/24/2015 -- she has some issues with her wheelchair cushion but other than that is doing very well and has received Podus boots for her feet. 02/14/2015 -- she was using her old offloading boots and this seemed to have caused her a new pressure ulcer on the left posterior heel near the superior part just below the Achilles tendon. 03/07/2015 -- she has a new ulceration just to the left of the midline on her sacral region more on the left buttock and this has been there for Dr. Leland Johns and had all the wounds sharply debrided. The debridement was done for the left ischial wound, the left heel wound and the right about a week. 08/22/2015 -- was recently admitted to hospital between May 5 and 08/13/2015, with sepsis and leukocytosis due to a UTI. she was treated for a sepsis complicating Escherichia coli UTI and kidney stones. She also had metabolic and careful up at the secondary to pyelonephritis. He received broad-spectrum antibiotics initially and then received Macrobid as per urology. She was sent home on nitrofurantoin. during her admission she  had a CT scan which showed exposed left ischial tuberosity without evidence of osteolysis. 09/12/2015-- the patient is having some issues with her air mattress and would like to get a opinion from medical modalities. 10/10/2015 -- the issue with her air mattress has not yet been sorted out and the new problem seems to be a lot of odor from the wound VAC. 11/27/2015 -- the patient was admitted to the hospital between July 23 and 10/31/2015. Her problems were sepsis, osteomyelitis of the pelvic bone and acute pyelonephritis. CT of the abdomen and pelvis was consistent with a left-sided pyelonephritis with hydronephrosis and also just showed new sclerosis of the posterior portion of the left anterior pubic ramus suggestive of periosteal reaction consistent with osteomyelitis. She was treated for the osteomyelitis with infectious disease consult recommending 6 weeks of IV antibiotics including vancomycin and Rocephin and the antibiotics were to go on until 12/10/2015. He was seen by Dr. Iran Planas plastic surgery and Dr. Linus Salmons of infectious disease. She had a suprapubic catheter placed during the admission. CT scan done on 10/28/2015 showed specifically -- New sclerosis of the posterior portion of the left inferior pubic  ramus with aggressive periosteal reaction, consistent with osteomyelitis, with adjacent soft tissue gas compatible with previously described decubitus ulcer. 12/12/2015 -- she was recently seen by Dr. Linus Salmons, who noted good improvement and CRP and ESR compared to before and he has stopped her antibiotic as per plans to finish on September 4. The patient was encouraged to continue with wound care and consider hyperbaric oxygen therapy. Today she tells me that she has consented to undergo hyperbaric oxygen therapy and we can start the paperwork. 01/02/2016 -- her PCP had gained about 3 years but she still persists in having problems during hyperbaric oxygen therapy with some discomfort in the  ears. 01/09/16; pressure area with underlying osteomyelitis in the left buttock. Wound bed itself has some slight amount of grayish surface slough however I do not think any debridement was necessary. There is no exposed bone soft tissue appears stable. She is using a wound VAC 01/16/16; back for weekly wound review in conjunction with HBO. She has a deep wound over the left initial tuberosity previously treated with 6 weeks of IV antibiotics for osteomyelitis. Wound bed looks reasonably healthy although the base of this is still precariously close to bone. She has been using a wound VAC. 01/23/2016 -- she has completed her course of antibiotics and this week the only new thing is her right great toe nail was avulsed and she has got an open wound over the nailbed. 01/31/16 she has completed her course of antibiotics. Her right great toenail avulsed last week and she's been using silver alginate for this as well. Still using a wound VAC to the substantial stage IV wound over the left ischial tuberosity 03/05/2016 -- the patient has had a opinion from the plastic surgery group at [facility] and details of this are not available yet but the patient's verbal report has been heard by me. Did not sound like there was any optimistic discussion regarding reconstruction and the net result would be to continue with the wound VAC application. I will await the official reports. Addendum: -- she was seen at [facility] surgery service by Dr. Tressa Busman. After a thorough review and from what I understand spending 45 minutes with the patient his assessment has been noted by me in detail and the management options were: 1. Continued pressure offloading and wound care versus operative procedures including wound excision 2. Soft tissue and bone sampling 3. If the wound gets larger wound closure would be done using a variety of plastic surgical techniques including but not limited to skin  substitute, possible skin graft, local versus regional flaps, negative pressure dressing application. 4. He discussed with her details of flap surgery and the risks associated 5. He made a comment that since the patient was operated on by Dr. Leland Johns of [facility] plastic surgery unit in [place] the patient may continue to follow-up there for further evaluation for surgical flap closure in the future. 03/19/2016 -- the patient continues to be rather depressed and frustrated with her lack of rapid progress in healing this wound especially because she thought after hyperbaric oxygen therapy the wound would heal extremely fast. She now understands that was not the implied benefit on wound care which was the recommendation for hyperbaric oxygen therapy. I have had a lengthy discussion with the patient and her husband regarding her options: 1. Continue with collagen and wound VAC for the primary dressing and offloading and all supportive care. 2. See Dr. Iran Planas for possible placement  of Acell or Integra in the OR. 3. get a second opinion from a wound care center and surrounding regions/counties 05/07/2016 -- Note from Dr. Celedonio Miyamoto, who noted that the patient has declined flap surgery. She has discussed application of A cell, and try a few applications to see how the wound progresses. She is also recommended that we could apply products here in the wound center, like Oasis. during her preop workup it was found that her hemoglobin A1c was 11% and she has now been diagnosed as having diabetes mellitus and has been put on appropriate treatment by her PCP 05/28/2016 -- tells me her blood sugars have been doing well and she has an appointment to see her PCP in the next couple of weeks to check her hemoglobin A1c. Other than that she continues to do well. 06/25/2016 -- have not seen her back for the last month but she says her health has been about the same and she has an appointment  to check the A1c next week 09/10/16 ---- was seen by Dr. Celedonio Miyamoto -- who applied Acell and saw her back in follow-up. She has recommended silver alginate to the wound every other day and cover with foam. If no significant drainage could transition to collagen every other day. She recommended discontinuing wound VAC. There were no plans to repeat application of Acell. The patient expressed that her husband could do the wound care as going to the Wound Ctr., would cost several $100 for each visit. 10/21/2016 -- her insurance company is getting her new mattress and she is pleased about that. Other than that she has been doing dressings with PolyMem Silver and has been doing very well 02/18/2017 -- she has gone through several changes of her mattress and has not been pleased with any of them. The ventricles are still working on trying to fit her with the appropriate low air-loss mattress. She has a new wound on the gluteal area which is clearly separated from the original wound. 03/25/17-she is here in follow-up evaluation for her left ischialpressure ulcer. She remains unsatisfied with her pressure mattress. She admits to sitting multiple hours a day, in the bed. We have discussed offloading options. The wound does not appear infected. Nutrition does not appear to be a concern. Will follow-up in 4 weeks, if wound continues to be stalled may consider x-ray to evaluate for refractory osteomyelitis. 04/21/17; this is a patient that I don't know all that well. She has a chronic wound which at one point had underlying osteomyelitis in the left ischial tuberosity. This is a stage IV pressure ulcer. Over the last 3 months she has a stage II wound inferiorly to the original wound. The last time she was here her dressing was changed to silver collagen although the patient's husband who changes the dressing said that the collagen stuck to the wound and remove skin from the superficial area therefore he  switched back to [place] 05/13/17; this is a patient we've been following for a left ischial tuberosity wound which was stage IV at one point had underlying osteomyelitis. Over the last several months she's had a stage II wound just inferior and medial to the related to the wound. According to her husband he is using Endoform layer with collagen although this is not what I had last time. According to her husband they are using Elgie [place] with collagen although I don't quite know how that started. She was hospitalized from 1/20 through 04/30/16. This was related  to a UTI. Her blood cultures were negative, urine culture showed multiple species. She did have a CT scan of the abdomen and pelvis which documented chronic osteomyelitis in the area of the wound inflammatory markers were unremarkable. She has had prior knowledge of osteomyelitis. It looks as though she received IV antibiotics in 2017 and was treated with a course of hyperbaric oxygen. 05/28/17; the wound over the left ischial tuberosity is deeper today and abuts clearly on bone. Nursing intake reported drainage. I therefore culture of the wound. The more superficial area just below this looks about the same. They once again complained that there are mattress cover is not working although apparently advanced Homecare is been noted to see this many times in the report is that the device is functional 06/18/17; the patient had a probing area on the left ischial tuberosity that was draining purulent fluid last time. This also clearly seemed to have open bone. Culture I did showed pansensitive pseudomonas including third generation cephalosporins. I treated this with cefdinir 300 twice a day for 10 days and things seem to have improved. She has a more superficial wound just underneath this area. Amazingly she has a new air mattress through [facility]. I think they gave this to her as a parking give. In any case this now works according  to the patient may have something to do with why the areas are looking better. 07/09/17; the patient has a probing area in the left ischial tuberosity that still has some depth. However this is contracted in terms of the wound orifice although the depth is still roughly the same. There is no undermining. ooShe also has the satellite wound which is more superficial. This appears to have a healthy surface we've been using silver collagen 08/06/17; the patient's wound is over the left ischial tuberosity and a satellite lesion just underneath this. The original wound was actually a deep stage 4 wound. We have made good progress in 2 months and there is no longer exposed bone here. 09/03/17; left ischial tuberosity actually appears to be quite healthy. I think we are making progress. No debridement is required. There is no surrounding erythema 10/01/17 I follow this patient monthly for her left ischial tuberosity wound. There is 2 areas the original area and a satellite area. The satellite area looks a lot better there is no surrounding erythema. Her husband relates that he is having trouble maintaining the dressing. This has to do with the soft tissue around it. He states he puts the collagen in but he cannot make sure that it stays in even with the ABD pads and tape that he is been using 10/29/17; patient arrives with a better looking noon today. Some of the satellite lesions have closed. using Prisma 11/26/17; the patient has a large cone-shaped area with the tip of the Cone deep within her buttock soft tissue. The walls of the Cone are epithelialized however the base is still open. The area at the base of this looks moist we've been using silver collagen. Will change to silver alginate 12/31/2017; the wound appears to have come in fairly nicely. Using silver alginate. There is no surrounding maceration or infection 01/28/18; there is still an open area here over the left initial tuberosity. Base of this  however looks healthy. There is no surrounding infection 02/25/18; the area of its open is over the left ischial tuberosity. The base of this is where the wound is. This is a large inverted cone-shaped area  with the wound at the tip. Dimensions of the wound at the tip are improved. There is a area of denuded skin about halfway towards the tip which her husband thinks may have happened today when he was bathing her. 04/20/17; the area is still open over the left initial tuberosity. 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 This is now completely closed over although it is closed over with a divot and skin over bone at the base of this. Continued aggressive offloading will be necessary. 12/30/2018 READMISSION This is a 70 year old woman with chronic paraplegia. I picked her up for her care from Dr. Con Memos in this clinic after he departed. She had a stage IV pressure wound over the left ischial tuberosity. She was treated twice for her underlying osteomyelitis and this I believe firstly in 2016 and again in 2017. There were some plans at some point for flap closure of this however she was discovered to have uncontrolled diabetes and I do not think this was ever accomplished. She ultimately healed over in this clinic and was discharged in May. She has a large cone-shaped indentation with the tip of this going towards the left ischial tuberosity. It is not an easy area to examine but at that time I thought all of this was epithelialized. Apparently there was a reopening here shortly after she left the clinic last time. She was admitted to hospital at the end of June for Klebsiella bacteremia felt to be secondary to UTI. A CT scan of the pelvis is listed below and there was initially some concern that she had  underlying osteomyelitis although I believe she was seen by infectious disease and that was felt to be not the case: I do not see any new cultures or inflammatory markers IMPRESSION: 1. No CT evidence for acute intra-abdominal or pelvic abnormality. Large volume of stool throughout the colon. 2. Enlarged fatty liver with fat sparing near the gallbladder fossa 3. Cortical scarring right kidney. Bilateral intrarenal stones without hydronephrosis. Thick-walled urinary bladder decompressed by suprapubic catheter 4. Deep left decubitus ulcer with underlying left ischial changes suggesting osteomyelitis. Her husband has been using silver collagen in the wound. She has not been systemically unwell no fever chills eating and drinking well. They rigorously offload this wound only getting up in the wheelchair when she is going to appointments the rest of the time she is in bed. 10/8; wound measures larger and she now has exposed bone. We have been using silver alginate 11/12 still using silver alginate. The patient saw Dr. Megan Salon of infectious disease. She was started on Augmentin 500 mg twice daily. She is due to follow-up with Dr. Megan Salon I believe next week. Lab work Dr. Megan Salon requested showed a sedimentation rate of 28 and CRP of 20 although her CRP 1 year ago was 18.8. Sedimentation rate 1 year ago was 11 basic metabolic panel showed a creatinine of 1.12 12/3; the patient followed up with Dr. Megan Salon yesterday. She is still on Augmentin twice daily. This was directed by Dr. Megan Salon. The patient's inflammatory markers have improved which is gratifying. Her C-reactive protein was repeated yesterday and follow-up booked with infectious disease in January. In addition I have been getting secure text messages I think from palliative care [facility] [employer]. I  think they were hoping to provide services to the patient in her home. They could not get a hold of the primary  physician and so they reached out to me on 2 separate occasions. 12/17; patient last saw Dr. Megan Salon on 12/2. She is finishing up with Augmentin. Her C-reactive protein was 20 on 10/21, 10.1 on 11/19 and 17 on 12/2. The wound itself still has depth and undermining. We are using Santyl with the backing wet-to-dry 04/27/2019. The wound is gradually clearing up in terms of the surface although it is not filled in that much. Still abuts right against bone 2/4; patient with a deep pressure ulcer over the left ischial tuberosity. I thought she was going to follow-up with infectious disease to follow her inflammatory markers although the patient states that they stated that they did not need to see her unless we felt it was necessary. I will need to check their notes. In any case we ordered moistened silver collagen back with wet-to-dry to fill in the depth of the wound although apparently [facility] sent silver alginate which they have been using since they were here the last time. Is obviously not what we ordered. 2/25. Not much change in this wound it is over the left ischial tuberosity recurrent wound. We have been using silver collagen with backing wet-to-dry. I think the wound is about the same. There is still some tunneling from about 10-12 o'clock over the ischial tuberosity itself 3/11; pressure ulcer over the left ischial tuberosity. Since she was last here the wound VAC was started and apparently going quite well. We are able to get the home health company that accepts [place] healthcare which is in itself sometimes problematic. There is been improvements in the wound the tunneling seems to be better and is contracted nicely 4/8; 1 month follow-up. Since she was last here we have been using silver collagen under a wound VAC. Some minor contraction I think in wound volume. She is cared for diligently by her husband including pressure relief, incontinence management, nutritional support etc. 6/1; this is  almost a 39-month follow-up. She is been using silver collagen under wound VAC. Circular area over the left ischial tuberosity. She has been using silver collagen under wound VAC 7/8; 1 month follow-up. Silver collagen under the VAC not really a lot of progress. Tissue at the base of the wound which is right against bone and the tissue next that this does not look completely viable. She is not currently on any antibiotics, she had underlying osteomyelitis I need to look this over 8/16; we are using silver collagen under wound VAC to the left ischial tuberosity wound. Comes in today with absolutely no change in surface area or depth. There is no exposed bone. I did look over her infectious disease notes as I said I would do last time. She last saw Dr. Megan Salon in December 2020. She completed 6 weeks of Augmentin. This was in response to a bone culture I did showing methicillin susceptible staph aureus and Enterococcus. She was supposed to come back to see Dr. Megan Salon at some point although they say that that appointment was canceled unless I chose to recommend return. I think there was supposed to be follow-up with inflammatory markers but I cannot see that that was ever done. She has not been on antibiotics since 9/21; monthly follow-up. We received a call from home health nurse last evening to report green drainage coming out of the wound. Lab work I ordered last time  showed a white count of 5.2 a sedimentation rate of 45 and a C-reactive protein of 25 however neither one of the 2 values are substantially different from her previous values in October 2020 or December 2020. Both are slightly higher but only marginally. Otherwise no new complaints from the patient or her husband 10/19; 1 month follow-up. PCR culture I did last time showed medium quantities of Pseudomonas lower quantities Klebsiella and Enterococcus faecalis group B strep and Peptostreptococcus. I gave her Augmentin for 2 weeks. I am not  really sure of my choice of this I would not cover Pseudomonas. She is still having green drainage. Wound itself looks satisfactory there is not a lot of depth wound bed looks healthy 11/16; patient has completed the antibiotics still using gentamicin and silver alginate on the wound. There is improvement in the surface area 12/21; in general the area on the buttock looks somewhat better. Surface looks healthy although I do not know that there is been much improvement in the wound volume. We have been using silver alginate and Hydrofera Blue. Less drainage. In passing the husband showed me an abrasion injury on the left anterior tibia. Covered in necrotic surface. He has noticed this for about a week and has been putting silver collagen on it. He is completely uncertain about how this happened 1/25; monthly follow-up. The area on the left buttock is about the same. This does not go to bone but a fairly deep wound surface of the wound is of questionable viability. The abrasion injury that they showed me last time apparently was closed out by home health because they thought it was healed but certainly is not although it is just about healed. As a result they haven't been applying anything to this area Finally I did discuss with the patient and her husband the idea of an advanced treatment product to try and get a proper base to this wound I was thinking of Puraply however actually the patient points out that her co-pay for coming to visit [place] i.e. the facility, charge would be unaffordable if they have to, on a weekly basis 2/22; pressure ulcer on the left buttock appears deeper to me and abuts on the ischial tuberosity. I thought initially there was exposed bone but there is a rim of tissue over this area. She also has a superficial over the right anterior mid tibia. Been using silver collagen to these areas without much success. I have looked over the patient's past history with regards to the area  on the left ischium. She did have underlying osteomyelitis here dating back I think to late 2020. She saw Dr. Megan Salon she received a 6-week course of oral antibiotics in response to a bone culture that I did. This does not appear to be infected but it certainly has not been improving in terms of granulation. I do not believe she has had any recent imaging studies 3/29; 1 month follow-up. Pressure ulcer on the left buttock which she has been dealing with with for a number of years. She was treated for underlying osteomyelitis at 1.2 or 3 years ago I think with infectious disease help. She also had I think a flap closure by Dr. Leland Johns and that lasted for about a year and then reopened. I have not been able to get this patient to progress towards healing although truthfully the wound is absolutely no worse. We have been using Hydrofera Blue 4/26; patient presents for 1 week follow-up. She has been using silver  collagen to the area every other day. She has home health that comes out once a week to help with dressing changes as well. The patient is interested in trying a skin substitute over this area. She states she is trying to relieve pressure off of it most of the day. 5/24; patient presents for 1 month follow-up. She has been using silver collagen to the area every other day. She has no complaints today. She is interested in the skin substitute. She tries to leave relieve pressure off Her bottom however is not able to most of the day 6/14; using silver collagen to the area over the left ischial tuberosity. Wound does not appear to be doing particularly well. Open to bone 6/28; the patient patient presented last time with a marked deterioration. Depth probing all the way to bone. The bone itself did not look particularly viable. In spite of this the x-ray I did showed longstanding ulcer over the left ischial tuberosity with chronic bone involvement/reaction that was also seen by CT in 2020. Lab work  did not show just active infection with a sed rate of 14 and a C-reactive protein of 7.1. Her comprehensive metabolic panel was normal including an albumin of 4.1 white count was 6 7/19; patient was here 3 weeks ago with a marked deterioration in her wound over the left ischial tuberosity. I ordered a CT scan of the area for 1 reason or another this just did not get done. It is now booked for 8 days from now. She was supposed to come back for bone culture and pathology. That did not happen either. We have been using silver collagen. As usual she is diligently looked after by her husband 8/24; 5-week follow-up. Since the patient was last here the biopsy that I did of her ischial tuberosity came back suggesting osteomyelitis. Culture showed strep. I started her on Augmentin. She was seen by infectious disease Dr. Lucianne Lei dam and the Augmentin was continued and she is still taking it. CT scan did not suggest anything other than chronic osteomyelitis without much change from her previous study. Her C-reactive protein was only 7 and sedimentation rate was 1.1. We are still using silver collagen to the wound. She has home health. Her husband is very diligent in her care for this reason I have never pursued a diverting colostomy. She would not agree to this surgery in any case. Finally we have discussed plastic surgery with him in the past and she is not interested in a myocutaneous flap. 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 Since she was last here she was in the ER last week with abdominal pain. She was found to be impacted however in the course of the review there somebody gave her some IV morphine and apparently she developed hives and blisters. There is still tense blisters on her plantar left fifth and fourth fingers 10/4; the patient has completed her Augmentin and is due to follow-up with Dr. Drucilla Schmidt in early November. Her wound today measures about the same but looks a little healthier in terms of  granulation there is no exposed bone. I note that she was admitted to hospital for 2 days from 9/21 through 9/23 for delirium. She had received Versed for glaucoma surgery ultimately that was felt to be the etiology. Her blood cultures were negative she had 30,000 colonies of Pseudomonas in her urine. She did receive broad-spectrum antibiotic therapy but ultimately her wound on the buttock was not felt to be  the cause. As mentioned she has completed her antibiotics still using silver collagen 11/1; patient has completed her antibiotics for the underlying osteomyelitis. She apparently follows up with Dr. Drucilla Schmidt next week. The wound does not look too bad perhaps slightly narrower in terms of width but the depth is about the same. We have been using Hydrofera Blue. The patient talks to me about a wound VAC and made it sound as though she was recently on 1 although I do not see this. The other option is an advanced treatment product like Oasis but that means weekly trips into the clinic. She has previously said she does not want an attempt at plastic surgery 11/15; the patient saw Dr. Drucilla Schmidt noted that her follow-up inflammatory markers normalized. She has completed her antibiotics. We are currently using Hydrofera Blue. Wound itself has some surface tissue over bone but certainly not a lot. I have looked back over her history. The patient has had recurrent osteomyelitis in this area as she is received prolonged courses of antibiotics. We did use a wound VAC for a prolonged period of time in 2021. We had some improvement especially in undermining areas but overall not a lot of measurable improvement. Once again I have tried to think about using advanced treatment products in this area something that would require them visiting the clinic very frequently and they did not seem to want to do this. Most recently we ran [place] however she would have a $825 per application co-pay 05/3; left ischial tuberosity. I  do not see much difference in 3 weeks. There is no exposed bone. We are using Hydrofera Blue. Our intake nurse reports some "greenish" drainage Objective Constitutional Sitting or standing Blood Pressure is within target range for patient.. Pulse regular and within target range for patient.Marland Kitchen Respirations regular, non-labored and within target range.. Temperature is normal and within the target range for the patient.Marland Kitchen Appears in no distress. Vitals Time Taken: 10:54 AM, Height: 63 in, Weight: 185 lbs, BMI: 32.8, Temperature: 98.9 F, Pulse: 86 bpm, Respiratory Rate: 16 breaths/min, Blood Pressure: 135/84 mmHg. General Notes: Wound exam; not much change. There is still granulation tissue over bone but certainly no improvement in the depth. We are not making progress with increasing granulation. There is not much undermining or tunneling. I see no evidence of surrounding skin or soft tissue infection Integumentary (Hair, Skin) Wound #12 status is Open. Original cause of wound was Pressure Injury. The date acquired was: 09/06/2018. The wound has been in treatment 114 weeks. The wound is located on the Left Ischial Tuberosity. The wound measures 3.3cm length x 1.5cm width x 1.1cm depth; 3.888cm^2 area and 4.276cm^3 volume. There is Fat Layer (Subcutaneous Tissue) exposed. There is no tunneling noted, however, there is undermining starting at 12:00 and ending at 12:00 with a maximum distance of 1.2cm. There is a medium amount of serosanguineous drainage noted. The wound margin is well defined and not attached to the wound base. There is large (67-100%) red, pink granulation within the wound bed. There is a small (1-33%) amount of necrotic tissue within the wound bed including Adherent Slough. 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 - Dr. Dellia Nims Bathing/ Shower/  Hygiene: May shower and wash wound with soap and water. - with dressing changes Edema Control - Lymphedema / SCD / Other: Elevate legs to the level of the heart or above  for 30 minutes daily and/or when sitting, a frequency of: Avoid standing for long periods of time. Off-Loading: Turn and reposition every 2 hours Home Health: New wound care orders this week; continue Home Health for wound care. May utilize formulary equivalent dressing for wound treatment orders unless otherwise specified. - Change primary dressing to silver alginate Other Home Health Orders/Instructions: [facility] Laboratory ordered were: Anerobic culture (PCR) - Left ischium WOUND #12: - Ischial Tuberosity Wound Laterality: Left Cleanser: Soap and Water (Home Health) 1 x Per Day/30 Days Discharge Instructions: May shower and wash wound with dial antibacterial soap and water prior to dressing change. Cleanser: Wound Cleanser (Home Health) 1 x Per Day/30 Days Discharge Instructions: Cleanse the wound with wound cleanser prior to applying a clean dressing using gauze sponges, not tissue or cotton balls. Peri-Wound Care: Skin Prep (Home Health) 1 x Per Day/30 Days Discharge Instructions: Use skin prep as directed Prim Dressing: KerraCel Ag Gelling Fiber Dressing, 2x2 in (silver alginate) (Home Health) 1 x Per Day/30 Days ary Discharge Instructions: Apply silver alginate to wound bed as instructed Secondary Dressing: ComfortFoam Border, 4x4 in (silicone border) ([place]) 1 x Per Day/30 Days Discharge Instructions: Apply over primary dressing as directed. 1. PCR culture 2. Looking towards topical antibiotics perhaps mechanical debridement if the PCR culture results is suggestive of significant bacterial colonization 3. Once again I have given the patient options here; this includes a trial of a advanced treatment like Oasis, reattempt of a wound VAC and or consideration of plastic surgery for flap culture. For 1  reason or another she has been very against any of these options in the past. With Oasis as a matter of cost and with wound VAC and plastic surgery at some matter of problems on a prior wound. 4. We cannot do anything until the first part of the year or else we will have to recertify again. Electronic Signature(s) Signed: 03/13/2021 3:48:37 PM By: Linton Ham MD Signed: 03/13/2021 3:48:37 PM By: Linton Ham MD Entered By: Linton Ham on 03/13/2021 12:29:26 -------------------------------------------------------------------------------- SuperBill Details Patient Name: Date of Service: [name] 03/13/2021 Medical Record Number: [id number] Patient Account Number: [id number] Date of Birth/Sex: Treating RN: 06-Feb-1951 (70 y.o. [name] Primary Care Provider: [name] Other Clinician: Referring Provider: Treating Provider/Extender: Tommy Rainwater in Treatment: 114 Diagnosis Coding ICD-10 Codes Code Description [phone] Pressure ulcer of left buttock, stage 4 E11.622 Type 2 diabetes mellitus with other skin ulcer Facility Procedures The patient participates with Medicare or their insurance follows the Medicare Facility Guidelines: CPT4 Code Description Modifier Quantity [id number] [place] VISIT-LEV 3 EST PT 1 Physician Procedures : CPT4 Code Description Modifier [id number] - WC PHYS LEVEL 4 - EST PT ICD-10 Diagnosis Description 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, stage 4 E11.622 Type 2 diabetes mellitus with other skin ulcer Quantity: 1 Electronic Signature(s) Signed: 03/13/2021 3:48:37 PM By: Linton Ham MD Signed: 03/13/2021 5:24:14 PM By: Levan Hurst RN, BSN Entered By: Levan Hurst on 03/13/2021 12:46:33

## 2021-03-13 NOTE — Progress Notes (Signed)
DAVIAN, WOLLENBERG ([id number]) Visit Report for 03/13/2021 Arrival Information Details Patient Name: Date of Service: [name], MRUK 03/13/2021 10:45 A M Medical Record Number: [id number] Patient Account Number: [id number] Date of Birth/Sex: Treating RN: 1951-01-30 (70 y.o. [name] Primary Care Aireal Slater: [name] Other Clinician: Referring Sequoia Mincey: Treating Gabriela Irigoyen/Extender: Tommy Rainwater in Treatment: 11 Visit Information History Since Last Visit Added or deleted any medications: No Patient Arrived: Wheel Chair Any new allergies or adverse reactions: No Arrival Time: 10:54 Had a fall or experienced change in No Accompanied By: husband activities of daily living that may affect Transfer Assistance: Manual risk of falls: Patient Identification Verified: Yes Signs or symptoms of abuse/neglect since last visito No Secondary Verification Process Completed: Yes Hospitalized since last visit: No Patient Requires Transmission-Based Precautions: No Implantable device outside of the clinic excluding No Patient Has Alerts: No cellular tissue based products placed in the center since last visit: Has Dressing in Place as Prescribed: Yes Pain Present Now: No Electronic Signature(s) Signed: 03/13/2021 5:24:14 PM By: Levan Hurst RN, BSN Entered By: Levan Hurst on 03/13/2021 10:55:17 -------------------------------------------------------------------------------- Clinic Level of Care Assessment Details Patient Name: Date of Service: [name]. 03/13/2021 10:45 A M Medical Record Number: [id number] Patient Account Number: [id number] Date of Birth/Sex: Treating RN: 24-Apr-1950 (70 y.o. Benjamine Sprague, Briant Cedar Primary Care Cheney Gosch: [name] Other Clinician: Referring Mairi Stagliano: Treating Terral Cooks/Extender: Tommy Rainwater in Treatment: 114 Clinic Level of Care Assessment Items TOOL 4 Quantity Score X- 1 0 Use when only an EandM is  performed on FOLLOW-UP visit ASSESSMENTS - Nursing Assessment / Reassessment X- 1 10 Reassessment of Co-morbidities (includes updates in patient status) X- 1 5 Reassessment of Adherence to Treatment Plan ASSESSMENTS - Wound and Skin A ssessment / Reassessment X - Simple Wound Assessment / Reassessment - one wound 1 5 []  - 0 Complex Wound Assessment / Reassessment - multiple wounds []  - 0 Dermatologic / Skin Assessment (not related to wound area) ASSESSMENTS - Focused Assessment []  - 0 Circumferential Edema Measurements - multi extremities []  - 0 Nutritional Assessment / Counseling / Intervention []  - 0 Lower Extremity Assessment (monofilament, tuning fork, pulses) []  - 0 Peripheral Arterial Disease Assessment (using hand held doppler) ASSESSMENTS - Ostomy and/or Continence Assessment and Care []  - 0 Incontinence Assessment and Management []  - 0 Ostomy Care Assessment and Management (repouching, etc.) PROCESS - Coordination of Care X - Simple Patient / Family Education for ongoing care 1 15 []  - 0 Complex (extensive) Patient / Family Education for ongoing care X- 1 10 Staff obtains Programmer, systems, Records, T Results / Process Orders est X- 1 10 Staff telephones HHA, Nursing Homes / Clarify orders / etc []  - 0 Routine Transfer to another Facility (non-emergent condition) []  - 0 Routine Hospital Admission (non-emergent condition) []  - 0 New Admissions / Biomedical engineer / Ordering NPWT Apligraf, etc. , []  - 0 Emergency Hospital Admission (emergent condition) X- 1 10 Simple Discharge Coordination []  - 0 Complex (extensive) Discharge Coordination PROCESS - Special Needs []  - 0 Pediatric / Minor Patient Management []  - 0 Isolation Patient Management []  - 0 Hearing / Language / Visual special needs []  - 0 Assessment of Community assistance (transportation, D/C planning, etc.) []  - 0 Additional assistance / Altered mentation []  - 0 Support Surface(s) Assessment  (bed, cushion, seat, etc.) INTERVENTIONS - Wound Cleansing / Measurement X - Simple Wound Cleansing - one wound 1 5 []  - 0 Complex  Wound Cleansing - multiple wounds X- 1 5 Wound Imaging (photographs - any number of wounds) []  - 0 Wound Tracing (instead of photographs) X- 1 5 Simple Wound Measurement - one wound []  - 0 Complex Wound Measurement - multiple wounds INTERVENTIONS - Wound Dressings X - Small Wound Dressing one or multiple wounds 1 10 []  - 0 Medium Wound Dressing one or multiple wounds []  - 0 Large Wound Dressing one or multiple wounds []  - 0 Application of Medications - topical []  - 0 Application of Medications - injection INTERVENTIONS - Miscellaneous []  - 0 External ear exam X- 1 5 Specimen Collection (cultures, biopsies, blood, body fluids, etc.) X- 1 5 Specimen(s) / Culture(s) sent or taken to Lab for analysis []  - 0 Patient Transfer (multiple staff / Civil Service fast streamer / Similar devices) []  - 0 Simple Staple / Suture removal (25 or less) []  - 0 Complex Staple / Suture removal (26 or more) []  - 0 Hypo / Hyperglycemic Management (close monitor of Blood Glucose) []  - 0 Ankle / Brachial Index (ABI) - do not check if billed separately X- 1 5 Vital Signs Has the patient been seen at the hospital within the last three years: Yes Total Score: 105 Level Of Care: New/Established - Level 3 Electronic Signature(s) Signed: 03/13/2021 5:24:14 PM By: Levan Hurst RN, BSN Entered By: Levan Hurst on 03/13/2021 12:45:38 -------------------------------------------------------------------------------- Encounter Discharge Information Details Patient Name: Date of Service: [name]. 03/13/2021 10:45 A M Medical Record Number: [id number] Patient Account Number: [id number] Date of Birth/Sex: Treating RN: 05/29/1950 (70 y.o. [name] Primary Care Kailin Principato: [name] Other Clinician: Referring Micaiah Remillard: Treating Nikalas Bramel/Extender: Tommy Rainwater in Treatment: 114 Encounter Discharge Information Items Discharge Condition: Stable Ambulatory Status: Wheelchair Discharge Destination: Home Transportation: Private Auto Accompanied By: husband Schedule Follow-up Appointment: Yes Clinical Summary of Care: Patient Declined Electronic Signature(s) Signed: 03/13/2021 5:24:14 PM By: Levan Hurst RN, BSN Entered By: Levan Hurst on 03/13/2021 12:47:14 -------------------------------------------------------------------------------- Multi Wound Chart Details Patient Name: Date of Service: [name]. 03/13/2021 10:45 A M Medical Record Number: [id number] Patient Account Number: [id number] Date of Birth/Sex: Treating RN: 1951/02/28 (70 y.o. Benjamine Sprague, Briant Cedar Primary Care Evann Koelzer: [name] Other Clinician: Referring Charisma Charlot: Treating Nicoli Nardozzi/Extender: Tommy Rainwater in Treatment: 114 Vital Signs Height(in): 63 Pulse(bpm): 86 Weight(lbs): 185 Blood Pressure(mmHg): 135/84 Body Mass Index(BMI): 33 Temperature(F): 98.9 Respiratory Rate(breaths/min): 16 Photos: [12:Left Ischial Tuberosity] [N/A:N/A N/A] Wound Location: [12:Pressure Injury] [N/A:N/A] Wounding Event: [12:Pressure Ulcer] [N/A:N/A] Primary Etiology: [12:Anemia, Hypertension, Type II] [N/A:N/A] Comorbid History: [12:Diabetes, Osteoarthritis, Dementia, Paraplegia, Received Radiation 09/06/2018] [N/A:N/A] Date Acquired: [12:114] [N/A:N/A] Weeks of Treatment: [12:Open] [N/A:N/A] Wound Status: [12:3.3x1.5x1.1] [N/A:N/A] Measurements L x W x D (cm) [12:3.888] [N/A:N/A] A (cm) : rea [12:4.276] [N/A:N/A] Volume (cm) : [12:-1547.50%] [N/A:N/A] % Reduction in A rea: [12:-1917.00%] [N/A:N/A] % Reduction in Volume: [12:12] Starting Position 1 (o'clock): [12:12] Ending Position 1 (o'clock): [12:1.2] Maximum Distance 1 (cm): [12:Yes] [N/A:N/A] Undermining: [12:Category/Stage IV] [N/A:N/A] Classification: [12:Medium] [N/A:N/A] Exudate A  mount: [12:Serosanguineous] [N/A:N/A] Exudate Type: [12:red, brown] [N/A:N/A] Exudate Color: [12:Well defined, not attached] [N/A:N/A] Wound Margin: [12:Large (67-100%)] [N/A:N/A] Granulation A mount: [12:Red, Pink] [N/A:N/A] Granulation Quality: [12:Small (1-33%)] [N/A:N/A] Necrotic A mount: [12:Fat Layer (Subcutaneous Tissue): Yes N/A] Exposed Structures: [12:Fascia: No Tendon: No Muscle: No Joint: No Bone: No Small (1-33%)] [N/A:N/A] Treatment Notes Electronic Signature(s) Signed: 03/13/2021 3:48:37 PM By: Linton Ham MD Signed: 03/13/2021 5:24:14 PM By: Levan Hurst RN, BSN Entered By: Dellia Nims,  Michael on 03/13/2021 12:24:12 -------------------------------------------------------------------------------- Multi-Disciplinary Care Plan Details Patient Name: Date of Service: [name], [name] 03/13/2021 10:45 A M Medical Record Number: [id number] Patient Account Number: [id number] Date of Birth/Sex: Treating RN: 12-Jun-1950 (70 y.o. [name] Primary Care Oluwateniola Leitch: [name] Other Clinician: Referring Nijel Flink: Treating Filip Luten/Extender: Tommy Rainwater in Treatment: 114 Active Inactive Wound/Skin Impairment Nursing Diagnoses: Knowledge deficit related to ulceration/compromised skin integrity Goals: Patient/caregiver will verbalize understanding of skin care regimen Date Initiated: 12/30/2018 Target Resolution Date: 04/12/2021 Goal Status: Active Interventions: Assess patient/caregiver ability to obtain necessary supplies Assess patient/caregiver ability to perform ulcer/skin care regimen upon admission and as needed Assess ulceration(s) every visit Provide education on ulcer and skin care Treatment Activities: Skin care regimen initiated : 12/30/2018 Topical wound management initiated : 12/30/2018 Notes: 01/08/21: Wound care regimen continues Electronic Signature(s) Signed: 03/13/2021 5:24:14 PM By: Levan Hurst RN, BSN Entered By: Levan Hurst on 03/13/2021 12:44:52 -------------------------------------------------------------------------------- Pain Assessment Details Patient Name: Date of Service: [name]. 03/13/2021 10:45 A M Medical Record Number: [id number] Patient Account Number: [id number] Date of Birth/Sex: Treating RN: 01-03-1951 (70 y.o. [name] Primary Care Brightyn Mozer: [name] Other Clinician: Referring Etsuko Dierolf: Treating Dalante Minus/Extender: Tommy Rainwater in Treatment: 114 Active Problems Location of Pain Severity and Description of Pain Patient Has Paino No Site Locations Pain Management and Medication Current Pain Management: Electronic Signature(s) Signed: 03/13/2021 5:24:14 PM By: Levan Hurst RN, BSN Entered By: Levan Hurst on 03/13/2021 10:59:39 -------------------------------------------------------------------------------- Patient/Caregiver Education Details Patient Name: Date of Service: [name] 12/7/2022andnbsp10:45 [place] Record Number: [id number] Patient Account Number: [id number] Date of Birth/Gender: Treating RN: 04-Mar-1951 (70 y.o. [name] Primary Care Physician: [name] Other Clinician: Referring Physician: Treating Physician/Extender: Tommy Rainwater in Treatment: 114 Education Assessment Education Provided To: Patient Education Topics Provided Wound/Skin Impairment: Methods: Explain/Verbal Responses: State content correctly [employer]) Signed: 03/13/2021 5:24:14 PM By: Levan Hurst RN, BSN Entered By: Levan Hurst on 03/13/2021 12:45:06 -------------------------------------------------------------------------------- Wound Assessment Details Patient Name: Date of Service: [name]. 03/13/2021 10:45 A M Medical Record Number: [id number] Patient Account Number: [id number] Date of Birth/Sex: Treating RN: 06/18/1950 (70 y.o. Benjamine Sprague, Briant Cedar Primary Care  Kennice Finnie: [name] Other Clinician: Referring [name] Aldaco: Treating Aniyia Rane/Extender: Tommy Rainwater in Treatment: 114 Wound Status Wound Number: 12 Primary Pressure Ulcer Etiology: Wound Location: Left Ischial Tuberosity Wound Open Wounding Event: Pressure Injury Status: Date Acquired: 09/06/2018 Comorbid Anemia, Hypertension, Type II Diabetes, Osteoarthritis, Weeks Of Treatment: 114 History: Dementia, Paraplegia, Received Radiation Clustered Wound: No Photos Wound Measurements Length: (cm) 3.3 Width: (cm) 1.5 Depth: (cm) 1.1 Area: (cm) 3.888 Volume: (cm) 4.276 % Reduction in Area: -1547.5% % Reduction in Volume: -1917% Epithelialization: Small (1-33%) Tunneling: No Undermining: Yes Starting Position (o'clock): 12 Ending Position (o'clock): 12 Maximum Distance: (cm) 1.2 Wound Description Classification: Category/Stage IV Wound Margin: Well defined, not attached Exudate Amount: Medium Exudate Type: Serosanguineous Exudate Color: red, brown Foul Odor After Cleansing: No Slough/Fibrino Yes Wound Bed Granulation Amount: Large (67-100%) Exposed Structure Granulation Quality: Red, Pink Fascia Exposed: No Necrotic Amount: Small (1-33%) Fat Layer (Subcutaneous Tissue) Exposed: Yes Necrotic Quality: Adherent Slough Tendon Exposed: No Muscle Exposed: No Joint Exposed: No Bone Exposed: No Treatment Notes Wound #12 (Ischial Tuberosity) Wound Laterality: Left Cleanser Soap and Water Discharge Instruction: May shower and wash wound with dial antibacterial soap and water prior to dressing change. Wound Cleanser Discharge  Instruction: Cleanse the wound with wound cleanser prior to applying a clean dressing using gauze sponges, not tissue or cotton balls. Peri-Wound Care Skin Prep Discharge Instruction: Use skin prep as directed Topical Primary Dressing KerraCel Ag Gelling Fiber Dressing, 2x2 in (silver alginate) Discharge Instruction: Apply silver  alginate to wound bed as instructed Secondary Dressing ComfortFoam Border, 4x4 in (silicone border) Discharge Instruction: Apply over primary dressing as directed. Secured With Compression Wrap Compression Stockings Environmental education officer) Signed: 03/13/2021 4:34:42 PM By: Rhae Hammock RN Signed: 03/13/2021 5:24:14 PM By: Levan Hurst RN, BSN Entered By: Rhae Hammock on 03/13/2021 11:07:54 -------------------------------------------------------------------------------- Vitals Details Patient Name: Date of Service: [name]. 03/13/2021 10:45 A M Medical Record Number: [id number] Patient Account Number: [id number] Date of Birth/Sex: Treating RN: Mar 02, 1951 (70 y.o. [name] Primary Care Samarra Ridgely: [name] Other Clinician: Referring Zniyah Midkiff: Treating Kailo Kosik/Extender: Tommy Rainwater in Treatment: 114 Vital Signs Time Taken: 10:54 Temperature (F): 98.9 Height (in): 63 Pulse (bpm): 86 Weight (lbs): 185 Respiratory Rate (breaths/min): 16 Body Mass Index (BMI): 32.8 Blood Pressure (mmHg): 135/84 Reference Range: 80 - 120 mg / dl Electronic Signature(s) Signed: 03/13/2021 5:24:14 PM By: Levan Hurst RN, BSN Entered By: Levan Hurst on 03/13/2021 10:59:14

## 2021-03-19 ENCOUNTER — Encounter (HOSPITAL_BASED_OUTPATIENT_CLINIC_OR_DEPARTMENT_OTHER): Payer: Medicare Other | Admitting: Internal Medicine

## 2021-03-20 ENCOUNTER — Encounter (HOSPITAL_BASED_OUTPATIENT_CLINIC_OR_DEPARTMENT_OTHER): Payer: Medicare Other | Admitting: Internal Medicine

## 2021-03-27 ENCOUNTER — Encounter (HOSPITAL_BASED_OUTPATIENT_CLINIC_OR_DEPARTMENT_OTHER): Payer: Medicare Other | Admitting: Internal Medicine

## 2021-03-27 ENCOUNTER — Other Ambulatory Visit: Payer: Self-pay

## 2021-03-27 DIAGNOSIS — L89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, stage 4: Secondary | ICD-10-CM | POA: Diagnosis not present

## 2021-03-28 NOTE — Progress Notes (Signed)
[name], [name] ([id number]) Visit Report for 03/27/2021 HPI Details Patient Name: Date of Service: [name], [name] 03/27/2021 9:15 A M Medical Record Number: [id number] Patient Account Number: [id number] Date of Birth/Sex: Treating RN: 01-Dec-1950 (70 y.o. [name], [name] Primary Care Provider: [name] Other Clinician: Referring Provider: Treating Provider/Extender: Tommy Rainwater in Treatment: 116 History of Present Illness Location: open ulceration of the left gluteal area, left heel and right ankle for about 5 months. Quality: Patient reports No Pain. Severity: Patient states wound(s) are getting worse. Duration: Patient has had the wound for > 5 months prior to seeking treatment at the wound center Context: The wound occurred when the patient has been paraplegic for about 3 years. Modifying Factors: Wound improving due to current treatment. ssociated Signs and Symptoms: Patient reports having foul odor. A HPI Description: this 70 year old patient who is known to have hypertension, hypothyroidism, breast cancer, chronic pain syndrome, paraplegia was noted to have a left gluteal decubitus ulcer and was brought into the hospital. During the course of her hospitalization she was debrided in the operating room by ankle wound. Bone cultures were taken at that time but were negative but clinically she was treated for osteomyelitis because of the probing down to bone and open exposed bone. Home health has been giving her antibioticss which include vancomycin and Zosyn. The patient was a smoker until about 3 weeks ago and used to smoke about 10 cigarettes a day for a long while. 12/13/2014 - details of her operative note from 11/03/2014 were reviewed -- PROCEDURE: 1. Excisional debridement skin, subcutaneous, muscle left ischium 35 cm2 2. Excisional debridement skin, subcutaneous tissue left heel 27 cm2 3. Excisional debridement right ankle skin, subcutaneous, bone  30 cm2 01/24/2015 -- she has some issues with her wheelchair cushion but other than that is doing very well and has received Podus boots for her feet. 02/14/2015 -- she was using her old offloading boots and this seemed to have caused her a new pressure ulcer on the left posterior heel near the superior part just below the Achilles tendon. 03/07/2015 -- she has a new ulceration just to the left of the midline on her sacral region more on the left buttock and this has been there for Dr. Leland Johns and had all the wounds sharply debrided. The debridement was done for the left ischial wound, the left heel wound and the right about a week. 08/22/2015 -- was recently admitted to hospital between May 5 and 08/13/2015, with sepsis and leukocytosis due to a UTI. she was treated for a sepsis complicating Escherichia coli UTI and kidney stones. She also had metabolic and careful up at the secondary to pyelonephritis. He received broad-spectrum antibiotics initially and then received Macrobid as per urology. She was sent home on nitrofurantoin. during her admission she had a CT scan which showed exposed left ischial tuberosity without evidence of osteolysis. 09/12/2015-- the patient is having some issues with her air mattress and would like to get a opinion from medical modalities. 10/10/2015 -- the issue with her air mattress has not yet been sorted out and the new problem seems to be a lot of odor from the wound VAC. 11/27/2015 -- the patient was admitted to the hospital between July 23 and 10/31/2015. Her problems were sepsis, osteomyelitis of the pelvic bone and acute pyelonephritis. CT of the abdomen and pelvis was consistent with a left-sided pyelonephritis with hydronephrosis and also just showed new sclerosis of the posterior portion of  the left anterior pubic ramus suggestive of periosteal reaction consistent with osteomyelitis. She was treated for the osteomyelitis with infectious disease consult  recommending 6 weeks of IV antibiotics including vancomycin and Rocephin and the antibiotics were to go on until 12/10/2015. He was seen by Dr. Iran Planas plastic surgery and Dr. Linus Salmons of infectious disease. She had a suprapubic catheter placed during the admission. CT scan done on 10/28/2015 showed specifically -- New sclerosis of the posterior portion of the left inferior pubic ramus with aggressive periosteal reaction, consistent with osteomyelitis, with adjacent soft tissue gas compatible with previously described decubitus ulcer. 12/12/2015 -- she was recently seen by Dr. Linus Salmons, who noted good improvement and CRP and ESR compared to before and he has stopped her antibiotic as per plans to finish on September 4. The patient was encouraged to continue with wound care and consider hyperbaric oxygen therapy. Today she tells me that she has consented to undergo hyperbaric oxygen therapy and we can start the paperwork. 01/02/2016 -- her PCP had gained about 3 years but she still persists in having problems during hyperbaric oxygen therapy with some discomfort in the ears. 01/09/16; pressure area with underlying osteomyelitis in the left buttock. Wound bed itself has some slight amount of grayish surface slough however I do not think any debridement was necessary. There is no exposed bone soft tissue appears stable. She is using a wound VAC 01/16/16; back for weekly wound review in conjunction with HBO. She has a deep wound over the left initial tuberosity previously treated with 6 weeks of IV antibiotics for osteomyelitis. Wound bed looks reasonably healthy although the base of this is still precariously close to bone. She has been using a wound VAC. 01/23/2016 -- she has completed her course of antibiotics and this week the only new thing is her right great toe nail was avulsed and she has got an open wound over the nailbed. 01/31/16 she has completed her course of antibiotics. Her right great toenail  avulsed last week and she's been using silver alginate for this as well. Still using a wound VAC to the substantial stage IV wound over the left ischial tuberosity 03/05/2016 -- the patient has had a opinion from the plastic surgery group at [facility] and details of this are not available yet but the patient's verbal report has been heard by me. Did not sound like there was any optimistic discussion regarding reconstruction and the net result would be to continue with the wound VAC application. I will await the official reports. Addendum: -- she was seen at [facility] by Dr. Tressa Busman. After a thorough review and from what I understand spending 45 minutes with the patient his assessment has been noted by me in detail and the management options were: 1. Continued pressure offloading and wound care versus operative procedures including wound excision 2. Soft tissue and bone sampling 3. If the wound gets larger wound closure would be done using a variety of plastic surgical techniques including but not limited to skin substitute, possible skin graft, local versus regional flaps, negative pressure dressing application. 4. He discussed with her details of flap surgery and the risks associated 5. He made a comment that since the patient was operated on by Dr. Leland Johns of [facility] plastic surgery unit in [place] the patient may continue to follow-up there for further evaluation for surgical flap closure in the future. 03/19/2016 -- the patient continues to be rather depressed and frustrated  with her lack of rapid progress in healing this wound especially because she thought after hyperbaric oxygen therapy the wound would heal extremely fast. She now understands that was not the implied benefit on wound care which was the recommendation for hyperbaric oxygen therapy. I have had a lengthy discussion with the patient and her husband regarding her  options: 1. Continue with collagen and wound VAC for the primary dressing and offloading and all supportive care. 2. See Dr. Iran Planas for possible placement of Acell or Integra in the OR. 3. get a second opinion from a wound care center and surrounding regions/counties 05/07/2016 -- Note from Dr. Celedonio Miyamoto, who noted that the patient has declined flap surgery. She has discussed application of A cell, and try a few applications to see how the wound progresses. She is also recommended that we could apply products here in the wound center, like Oasis. during her preop workup it was found that her hemoglobin A1c was 11% and she has now been diagnosed as having diabetes mellitus and has been put on appropriate treatment by her PCP 05/28/2016 -- tells me her blood sugars have been doing well and she has an appointment to see her PCP in the next couple of weeks to check her hemoglobin A1c. Other than that she continues to do well. 06/25/2016 -- have not seen her back for the last month but she says her health has been about the same and she has an appointment to check the A1c next week 09/10/16 ---- was seen by Dr. Celedonio Miyamoto -- who applied Acell and saw her back in follow-up. She has recommended silver alginate to the wound every other day and cover with foam. If no significant drainage could transition to collagen every other day. She recommended discontinuing wound VAC. There were no plans to repeat application of Acell. The patient expressed that her husband could do the wound care as going to the Wound Ctr., would cost several $100 for each visit. 10/21/2016 -- her insurance company is getting her new mattress and she is pleased about that. Other than that she has been doing dressings with PolyMem Silver and has been doing very well 02/18/2017 -- she has gone through several changes of her mattress and has not been pleased with any of them. The ventricles are still working on trying to  fit her with the appropriate low air-loss mattress. She has a new wound on the gluteal area which is clearly separated from the original wound. 03/25/17-she is here in follow-up evaluation for her left ischialpressure ulcer. She remains unsatisfied with her pressure mattress. She admits to sitting multiple hours a day, in the bed. We have discussed offloading options. The wound does not appear infected. Nutrition does not appear to be a concern. Will follow-up in 4 weeks, if wound continues to be stalled may consider x-ray to evaluate for refractory osteomyelitis. 04/21/17; this is a patient that I don't know all that well. She has a chronic wound which at one point had underlying osteomyelitis in the left ischial tuberosity. This is a stage IV pressure ulcer. Over the last 3 months she has a stage II wound inferiorly to the original wound. The last time she was here her dressing was changed to silver collagen although the patient's husband who changes the dressing said that the collagen stuck to the wound and remove skin from the superficial area therefore he switched back to Manson 05/13/17; this is a patient we've been following for a left  ischial tuberosity wound which was stage IV at one point had underlying osteomyelitis. Over the last several months she's had a stage II wound just inferior and medial to the related to the wound. According to her husband he is using Endoform layer with collagen although this is not what I had last time. According to her husband they are using Elgie Congo with collagen although I don't quite know how that started. She was hospitalized from 1/20 through 04/30/16. This was related to a UTI. Her blood cultures were negative, urine culture showed multiple species. She did have a CT scan of the abdomen and pelvis which documented chronic osteomyelitis in the area of the wound inflammatory markers were unremarkable. She has had prior knowledge of osteomyelitis. It  looks as though she received IV antibiotics in 2017 and was treated with a course of hyperbaric oxygen. 05/28/17; the wound over the left ischial tuberosity is deeper today and abuts clearly on bone. Nursing intake reported drainage. I therefore culture of the wound. The more superficial area just below this looks about the same. They once again complained that there are mattress cover is not working although apparently [facility] is been noted to see this many times in the report is that the device is functional 06/18/17; the patient had a probing area on the left ischial tuberosity that was draining purulent fluid last time. This also clearly seemed to have open bone. Culture I did showed pansensitive pseudomonas including third generation cephalosporins. I treated this with cefdinir 300 twice a day for 10 days and things seem to have improved. She has a more superficial wound just underneath this area. Amazingly she has a new air mattress through [facility]. I think they gave this to her as a parking give. In any case this now works according to the patient may have something to do with why the areas are looking better. 07/09/17; the patient has a probing area in the left ischial tuberosity that still has some depth. However this is contracted in terms of the wound orifice although the depth is still roughly the same. There is no undermining. She also has the satellite wound which is more superficial. This appears to have a healthy surface we've been using silver collagen 08/06/17; the patient's wound is over the left ischial tuberosity and a satellite lesion just underneath this. The original wound was actually a deep stage 4 wound. We have made good progress in 2 months and there is no longer exposed bone here. 09/03/17; left ischial tuberosity actually appears to be quite healthy. I think we are making progress. No debridement is required. There is no surrounding erythema 10/01/17 I  follow this patient monthly for her left ischial tuberosity wound. There is 2 areas the original area and a satellite area. The satellite area looks a lot better there is no surrounding erythema. Her husband relates that he is having trouble maintaining the dressing. This has to do with the soft tissue around it. He states he puts the collagen in but he cannot make sure that it stays in even with the ABD pads and tape that he is been using 10/29/17; patient arrives with a better looking noon today. Some of the satellite lesions have closed. using Prisma 11/26/17; the patient has a large cone-shaped area with the tip of the Cone deep within her buttock soft tissue. The walls of the Cone are epithelialized however the base is still open. The area at the base of this looks  moist we've been using silver collagen. Will change to silver alginate 12/31/2017; the wound appears to have come in fairly nicely. Using silver alginate. There is no surrounding maceration or infection 01/28/18; there is still an open area here over the left initial tuberosity. Base of this however looks healthy. There is no surrounding infection 02/25/18; the area of its open is over the left ischial tuberosity. The base of this is where the wound is. This is a large inverted cone-shaped area with the wound at the tip. Dimensions of the wound at the tip are improved. There is a area of denuded skin about halfway towards the tip which her husband thinks may have happened today when he was bathing her. 04/20/17; the area is still open over the left initial tuberosity. 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 This is now completely closed over although it is closed over with a divot and skin over bone at the base of this. Continued aggressive  offloading will be necessary. 12/30/2018 READMISSION This is a 70 year old woman with chronic paraplegia. I picked her up for her care from Dr. Con Memos in this clinic after he departed. She had a stage IV pressure wound over the left ischial tuberosity. She was treated twice for her underlying osteomyelitis and this I believe firstly in 2016 and again in 2017. There were some plans at some point for flap closure of this however she was discovered to have uncontrolled diabetes and I do not think this was ever accomplished. She ultimately healed over in this clinic and was discharged in May. She has a large cone-shaped indentation with the tip of this going towards the left ischial tuberosity. It is not an easy area to examine but at that time I thought all of this was epithelialized. Apparently there was a reopening here shortly after she left the clinic last time. She was admitted to hospital at the end of June for Klebsiella bacteremia felt to be secondary to UTI. A CT scan of the pelvis is listed below and there was initially some concern that she had underlying osteomyelitis although I believe she was seen by infectious disease and that was felt to be not the case: I do not see any new cultures or inflammatory markers IMPRESSION: 1. No CT evidence for acute intra-abdominal or pelvic abnormality. Large volume of stool throughout the colon. 2. Enlarged fatty liver with fat sparing near the gallbladder fossa 3. Cortical scarring right kidney. Bilateral intrarenal stones without hydronephrosis. Thick-walled urinary bladder decompressed by suprapubic catheter 4. Deep left decubitus ulcer with underlying left ischial changes suggesting osteomyelitis. Her husband has been using silver collagen in the wound. She has not been systemically unwell no fever chills eating and drinking well. They rigorously offload this wound only getting up in the wheelchair when she is going to appointments the rest of the  time she is in bed. 10/8; wound measures larger and she now has exposed bone. We have been using silver alginate 11/12 still using silver alginate. The patient saw Dr. Megan Salon of infectious disease. She was started on Augmentin 500 mg twice daily. She is due to follow-up with Dr. Megan Salon I believe next week. Lab work Dr. Megan Salon requested showed a sedimentation rate of 28 and CRP of 20 although her CRP 1 year ago  was 18.8. Sedimentation rate 1 year ago was 11 basic metabolic panel showed a creatinine of 1.12 12/3; the patient followed up with Dr. Megan Salon yesterday. She is still on Augmentin twice daily. This was directed by Dr. Megan Salon. The patient's inflammatory markers have improved which is gratifying. Her C-reactive protein was repeated yesterday and follow-up booked with infectious disease in January. In addition I have been getting secure text messages I think from palliative care through [facility] [employer]. I think they were hoping to provide services to the patient in her home. They could not get a hold of the primary physician and so they reached out to me on 2 separate occasions. 12/17; patient last saw Dr. Megan Salon on 12/2. She is finishing up with Augmentin. Her C-reactive protein was 20 on 10/21, 10.1 on 11/19 and 17 on 12/2. The wound itself still has depth and undermining. We are using Santyl with the backing wet-to-dry 04/27/2019. The wound is gradually clearing up in terms of the surface although it is not filled in that much. Still abuts right against bone 2/4; patient with a deep pressure ulcer over the left ischial tuberosity. I thought she was going to follow-up with infectious disease to follow her inflammatory markers although the patient states that they stated that they did not need to see her unless we felt it was necessary. I will need to check their notes. In any case we ordered moistened silver collagen back with wet-to-dry to fill in the depth of  the wound although apparently [facility] sent silver alginate which they have been using since they were here the last time. Is obviously not what we ordered. 2/25. Not much change in this wound it is over the left ischial tuberosity recurrent wound. We have been using silver collagen with backing wet-to-dry. I think the wound is about the same. There is still some tunneling from about 10-12 o'clock over the ischial tuberosity itself 3/11; pressure ulcer over the left ischial tuberosity. Since she was last here the wound VAC was started and apparently going quite well. We are able to get the home health company that accepts [place] healthcare which is in itself sometimes problematic. There is been improvements in the wound the tunneling seems to be better and is contracted nicely 4/8; 1 month follow-up. Since she was last here we have been using silver collagen under a wound VAC. Some minor contraction I think in wound volume. She is cared for diligently by her husband including pressure relief, incontinence management, nutritional support etc. 6/1; this is almost a 33-monthfollow-up. She is been using silver collagen under wound VAC. Circular area over the left ischial tuberosity. She has been using silver collagen under wound VAC 7/8; 1 month follow-up. Silver collagen under the VAC not really a lot of progress. Tissue at the base of the wound which is right against bone and the tissue next that this does not look completely viable. She is not currently on any antibiotics, she had underlying osteomyelitis I need to look this over 8/16; we are using silver collagen under wound VAC to the left ischial tuberosity wound. Comes in today with absolutely no change in surface area or depth. There is no exposed bone. I did look over her infectious disease notes as I said I would do last time. She last saw Dr. CMegan Salonin December 2020. She completed 6 weeks of Augmentin. This was in response to a bone culture I  did showing methicillin susceptible staph aureus  and Enterococcus. She was supposed to come back to see Dr. Megan Salon at some point although they say that that appointment was canceled unless I chose to recommend return. I think there was supposed to be follow-up with inflammatory markers but I cannot see that that was ever done. She has not been on antibiotics since 9/21; monthly follow-up. We received a call from home health nurse last evening to report green drainage coming out of the wound. Lab work I ordered last time showed a white count of 5.2 a sedimentation rate of 45 and a C-reactive protein of 25 however neither one of the 2 values are substantially different from her previous values in October 2020 or December 2020. Both are slightly higher but only marginally. Otherwise no new complaints from the patient or her husband 10/19; 1 month follow-up. PCR culture I did last time showed medium quantities of Pseudomonas lower quantities Klebsiella and Enterococcus faecalis group B strep and Peptostreptococcus. I gave her Augmentin for 2 weeks. I am not really sure of my choice of this I would not cover Pseudomonas. She is still having green drainage. Wound itself looks satisfactory there is not a lot of depth wound bed looks healthy 11/16; patient has completed the antibiotics still using gentamicin and silver alginate on the wound. There is improvement in the surface area 12/21; in general the area on the buttock looks somewhat better. Surface looks healthy although I do not know that there is been much improvement in the wound volume. We have been using silver alginate and Hydrofera Blue. Less drainage. In passing the husband showed me an abrasion injury on the left anterior tibia. Covered in necrotic surface. He has noticed this for about a week and has been putting silver collagen on it. He is completely uncertain about how this happened 1/25; monthly follow-up. The area on the left buttock is  about the same. This does not go to bone but a fairly deep wound surface of the wound is of questionable viability. The abrasion injury that they showed me last time apparently was closed out by home health because they thought it was healed but certainly is not although it is just about healed. As a result they haven't been applying anything to this area Finally I did discuss with the patient and her husband the idea of an advanced treatment product to try and get a proper base to this wound I was thinking of Puraply however actually the patient points out that her co-pay for coming to visit [place] i.e. the facility, charge would be unaffordable if they have to, on a weekly basis 2/22; pressure ulcer on the left buttock appears deeper to me and abuts on the ischial tuberosity. I thought initially there was exposed bone but there is a rim of tissue over this area. She also has a superficial over the right anterior mid tibia. Been using silver collagen to these areas without much success. I have looked over the patient's past history with regards to the area on the left ischium. She did have underlying osteomyelitis here dating back I think to late 2020. She saw Dr. Megan Salon she received a 6-week course of oral antibiotics in response to a bone culture that I did. This does not appear to be infected but it certainly has not been improving in terms of granulation. I do not believe she has had any recent imaging studies 3/29; 1 month follow-up. Pressure ulcer on the left buttock which she has been dealing with with  for a number of years. She was treated for underlying osteomyelitis at 1.2 or 3 years ago I think with infectious disease help. She also had I think a flap closure by Dr. Leland Johns and that lasted for about a year and then reopened. I have not been able to get this patient to progress towards healing although truthfully the wound is absolutely no worse. We have been using Hydrofera Blue 4/26;  patient presents for 1 week follow-up. She has been using silver collagen to the area every other day. She has home health that comes out once a week to help with dressing changes as well. The patient is interested in trying a skin substitute over this area. She states she is trying to relieve pressure off of it most of the day. 5/24; patient presents for 1 month follow-up. She has been using silver collagen to the area every other day. She has no complaints today. She is interested in the skin substitute. She tries to leave relieve pressure off Her bottom however is not able to most of the day 6/14; using silver collagen to the area over the left ischial tuberosity. Wound does not appear to be doing particularly well. Open to bone 6/28; the patient patient presented last time with a marked deterioration. Depth probing all the way to bone. The bone itself did not look particularly viable. In spite of this the x-ray I did showed longstanding ulcer over the left ischial tuberosity with chronic bone involvement/reaction that was also seen by CT in 2020. Lab work did not show just active infection with a sed rate of 14 and a C-reactive protein of 7.1. Her comprehensive metabolic panel was normal including an albumin of 4.1 white count was 6 7/19; patient was here 3 weeks ago with a marked deterioration in her wound over the left ischial tuberosity. I ordered a CT scan of the area for 1 reason or another this just did not get done. It is now booked for 8 days from now. She was supposed to come back for bone culture and pathology. That did not happen either. We have been using silver collagen. As usual she is diligently looked after by her husband 8/24; 5-week follow-up. Since the patient was last here the biopsy that I did of her ischial tuberosity came back suggesting osteomyelitis. Culture showed strep. I started her on Augmentin. She was seen by infectious disease Dr. Lucianne Lei dam and the Augmentin was  continued and she is still taking it. CT scan did not suggest anything other than chronic osteomyelitis without much change from her previous study. Her C-reactive protein was only 7 and sedimentation rate was 1.1. We are still using silver collagen to the wound. She has home health. Her husband is very diligent in her care for this reason I have never pursued a diverting colostomy. She would not agree to this surgery in any case. Finally we have discussed plastic surgery with him in the past and she is not interested in a myocutaneous flap. 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 Since she was last here she was in the ER last week with abdominal pain. She was found to be impacted however in the course of the review there somebody gave her some IV morphine and apparently she developed hives and blisters. There is still tense blisters on her plantar left fifth and fourth fingers 10/4; the patient has completed her Augmentin and is due to follow-up with Dr. Drucilla Schmidt in early November. Her wound  today measures about the same but looks a little healthier in terms of granulation there is no exposed bone. I note that she was admitted to hospital for 2 days from 9/21 through 9/23 for delirium. She had received Versed for glaucoma surgery ultimately that was felt to be the etiology. Her blood cultures were negative she had 30,000 colonies of Pseudomonas in her urine. She did receive broad-spectrum antibiotic therapy but ultimately her wound on the buttock was not felt to be the cause. As mentioned she has completed her antibiotics still using silver collagen 11/1; patient has completed her antibiotics for the underlying osteomyelitis. She apparently follows up with Dr. Drucilla Schmidt next week. The wound does not look too bad perhaps slightly narrower in terms of width but the depth is about the same. We have been using Hydrofera Blue. The patient talks to me about a wound VAC and made it sound as though she was  recently on 1 although I do not see this. The other option is an advanced treatment product like Oasis but that means weekly trips into the clinic. She has previously said she does not want an attempt at plastic surgery 11/15; the patient saw Dr. Drucilla Schmidt noted that her follow-up inflammatory markers normalized. She has completed her antibiotics. We are currently using Hydrofera Blue. Wound itself has some surface tissue over bone but certainly not a lot. I have looked back over her history. The patient has had recurrent osteomyelitis in this area as she is received prolonged courses of antibiotics. We did use a wound VAC for a prolonged period of time in 2021. We had some improvement especially in undermining areas but overall not a lot of measurable improvement. Once again I have tried to think about using advanced treatment products in this area something that would require them visiting the clinic very frequently and they did not seem to want to do this. Most recently we ran [place] however she would have a $481 per application co-pay 85/6; left ischial tuberosity. I do not see much difference in 3 weeks. There is no exposed bone. We are using Hydrofera Blue. Our intake nurse reports some "greenish" drainage 12/21 left ischial tuberosity. Measuring slightly smaller in surface area. The wound still has some tissue over bone i.e. there is no exposed bone. No overt infection. We did a deep tissue culture last time for PCR. The major bacteria was Pseudomonas although there were medium titers of Enterococcus faecalis Klebsiella E. coli and Morganella as well as Peptostreptococcus. Keystone antibiotic include streptomycin and vancomycin they got this last weekend and used it twice Electronic Signature(s) Signed: 03/27/2021 4:11:41 PM By: Linton Ham MD Entered By: Linton Ham on 03/27/2021 09:51:12 -------------------------------------------------------------------------------- Physical Exam  Details Patient Name: Date of Service: [name]. 03/27/2021 9:15 A M Medical Record Number: [id number] Patient Account Number: [id number] Date of Birth/Sex: Treating RN: 05-Feb-1951 (70 y.o. [name], [name] Primary Care Provider: [name] Other Clinician: Referring Provider: Treating Provider/Extender: Tommy Rainwater in Treatment: 116 Constitutional Sitting or standing Blood Pressure is within target range for patient.. Pulse regular and within target range for patient.Marland Kitchen Respirations regular, non-labored and within target range.. Temperature is normal and within the target range for the patient.Marland Kitchen Appears in no distress. Notes Wound exam; slightly smaller in terms of width. I think the depth is about the same. There is tissue over bone but only a thin layer. There is no surrounding infection no erythema nothing needs culturing. Electronic Signature(s) Signed:  03/27/2021 4:11:41 PM By: Linton Ham MD Entered By: Linton Ham on 03/27/2021 09:52:11 -------------------------------------------------------------------------------- Physician Orders Details Patient Name: Date of Service: [name]. 03/27/2021 9:15 A M Medical Record Number: [id number] Patient Account Number: [id number] Date of Birth/Sex: Treating RN: [birth year] (70 y.o. [name] Primary Care Provider: [name] Other Clinician: Referring Provider: Treating Provider/Extender: Tommy Rainwater in Treatment: [phone] Verbal / Phone Orders: No Diagnosis Coding ICD-10 Coding Code Description [id number] Pressure ulcer of left buttock, stage 4 E11.622 Type 2 diabetes mellitus with other skin ulcer Follow-up Appointments Return appointment in 3 weeks. - Dr. Dellia Nims Bathing/ Shower/ Hygiene May shower and wash wound with soap and water. - with dressing changes Edema Control - Lymphedema / SCD / Other Elevate legs to the level of the heart or above for 30 minutes  daily and/or when sitting, a frequency of: - throughout the day Avoid standing for long periods of time. Off-Loading Turn and reposition every 2 hours Home Health No change in wound care orders this week; continue Home Health for wound care. May utilize formulary equivalent dressing for wound treatment orders unless otherwise specified. Other Home Health Orders/Instructions: - [facility] Wound Treatment Wound #12 - Ischial Tuberosity Wound Laterality: Left Cleanser: Soap and Water (Home Health) 1 x Per Day/30 Days Discharge Instructions: May shower and wash wound with dial antibacterial soap and water prior to dressing change. Cleanser: Wound Cleanser (Home Health) 1 x Per Day/30 Days Discharge Instructions: Cleanse the wound with wound cleanser prior to applying a clean dressing using gauze sponges, not tissue or cotton balls. Peri-Wound Care: Skin Prep (Home Health) 1 x Per Day/30 Days Discharge Instructions: Use skin prep as directed Topical: Keystone antibiotic compound 1 x Per Day/30 Days Discharge Instructions: Apply antibiotic compound to wound bed, under silver alginate Prim Dressing: KerraCel Ag Gelling Fiber Dressing, 2x2 in (silver alginate) (Home Health) 1 x Per Day/30 Days ary Discharge Instructions: Apply silver alginate to wound bed as instructed Secondary Dressing: ComfortFoam Border, 4x4 in (silicone border) ([place]) 1 x Per Day/30 Days Discharge Instructions: Apply over primary dressing as directed. Electronic Signature(s) Signed: 03/27/2021 4:11:41 PM By: Linton Ham MD Signed: 03/28/2021 4:57:12 PM By: Levan Hurst RN, BSN Entered By: Levan Hurst on 03/27/2021 09:45:20 -------------------------------------------------------------------------------- Problem List Details Patient Name: Date of Service: [name]. 03/27/2021 9:15 A M Medical Record Number: [id number] Patient Account Number: [id number] Date of Birth/Sex: Treating RN: 1951/02/05  (71 y.o. [name] Primary Care Provider: [name] Other Clinician: Referring Provider: Treating Provider/Extender: Tommy Rainwater in Treatment: 116 Active Problems ICD-10 Encounter Code Description Active Date MDM Diagnosis 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, stage 4 12/30/2018 No Yes E11.622 Type 2 diabetes mellitus with other skin ulcer 12/30/2018 No Yes Inactive Problems ICD-10 Code Description Active Date Inactive Date G82.20 Paraplegia, unspecified 12/30/2018 12/30/2018 M86.68 Other chronic osteomyelitis, other site 02/17/2019 02/17/2019 S80.811D Abrasion, right lower leg, subsequent encounter 03/27/2020 03/27/2020 M86.68 Other chronic osteomyelitis, other site 11/28/2020 11/28/2020 L97.811 Non-pressure chronic ulcer of other part of right lower leg limited to breakdown of skin 03/27/2020 03/27/2020 Resolved Problems Electronic Signature(s) Signed: 03/27/2021 4:11:41 PM By: Linton Ham MD Entered By: Linton Ham on 03/27/2021 09:49:14 -------------------------------------------------------------------------------- Progress Note Details Patient Name: Date of Service: [name]. 03/27/2021 9:15 A M Medical Record Number: [id number] Patient Account Number: [id number] Date of Birth/Sex: Treating RN: 1950/11/29 (69 y.o. [name], [name] Primary Care Provider: [name]  Other Clinician: Referring Provider: Treating Provider/Extender: Tommy Rainwater in Treatment: 116 Subjective History of Present Illness (HPI) The following HPI elements were documented for the patient's wound: Location: open ulceration of the left gluteal area, left heel and right ankle for about 5 months. Quality: Patient reports No Pain. Severity: Patient states wound(s) are getting worse. Duration: Patient has had the wound for > 5 months prior to seeking treatment at the wound center Context: The wound occurred when the patient has been  paraplegic for about 3 years. Modifying Factors: Wound improving due to current treatment. Associated Signs and Symptoms: Patient reports having foul odor. this 70 year old patient who is known to have hypertension, hypothyroidism, breast cancer, chronic pain syndrome, paraplegia was noted to have a left gluteal decubitus ulcer and was brought into the hospital. During the course of her hospitalization she was debrided in the operating room by ankle wound. Bone cultures were taken at that time but were negative but clinically she was treated for osteomyelitis because of the probing down to bone and open exposed bone. Home health has been giving her antibioticss which include vancomycin and Zosyn. The patient was a smoker until about 3 weeks ago and used to smoke about 10 cigarettes a day for a long while. 12/13/2014 - details of her operative note from 11/03/2014 were reviewed -- PROCEDURE: 1. Excisional debridement skin, subcutaneous, muscle left ischium 35 cm2 2. Excisional debridement skin, subcutaneous tissue left heel 27 cm2 3. Excisional debridement right ankle skin, subcutaneous, bone 30 cm2 01/24/2015 -- she has some issues with her wheelchair cushion but other than that is doing very well and has received Podus boots for her feet. 02/14/2015 -- she was using her old offloading boots and this seemed to have caused her a new pressure ulcer on the left posterior heel near the superior part just below the Achilles tendon. 03/07/2015 -- she has a new ulceration just to the left of the midline on her sacral region more on the left buttock and this has been there for Dr. Leland Johns and had all the wounds sharply debrided. The debridement was done for the left ischial wound, the left heel wound and the right about a week. 08/22/2015 -- was recently admitted to hospital between May 5 and 08/13/2015, with sepsis and leukocytosis due to a UTI. she was treated for a sepsis complicating Escherichia coli  UTI and kidney stones. She also had metabolic and careful up at the secondary to pyelonephritis. He received broad-spectrum antibiotics initially and then received Macrobid as per urology. She was sent home on nitrofurantoin. during her admission she had a CT scan which showed exposed left ischial tuberosity without evidence of osteolysis. 09/12/2015-- the patient is having some issues with her air mattress and would like to get a opinion from medical modalities. 10/10/2015 -- the issue with her air mattress has not yet been sorted out and the new problem seems to be a lot of odor from the wound VAC. 11/27/2015 -- the patient was admitted to the hospital between July 23 and 10/31/2015. Her problems were sepsis, osteomyelitis of the pelvic bone and acute pyelonephritis. CT of the abdomen and pelvis was consistent with a left-sided pyelonephritis with hydronephrosis and also just showed new sclerosis of the posterior portion of the left anterior pubic ramus suggestive of periosteal reaction consistent with osteomyelitis. She was treated for the osteomyelitis with infectious disease consult recommending 6 weeks of IV antibiotics including vancomycin and Rocephin and the antibiotics were to go  on until 12/10/2015. He was seen by Dr. Iran Planas plastic surgery and Dr. Linus Salmons of infectious disease. She had a suprapubic catheter placed during the admission. CT scan done on 10/28/2015 showed specifically -- New sclerosis of the posterior portion of the left inferior pubic ramus with aggressive periosteal reaction, consistent with osteomyelitis, with adjacent soft tissue gas compatible with previously described decubitus ulcer. 12/12/2015 -- she was recently seen by Dr. Linus Salmons, who noted good improvement and CRP and ESR compared to before and he has stopped her antibiotic as per plans to finish on September 4. The patient was encouraged to continue with wound care and consider hyperbaric oxygen therapy. Today she  tells me that she has consented to undergo hyperbaric oxygen therapy and we can start the paperwork. 01/02/2016 -- her PCP had gained about 3 years but she still persists in having problems during hyperbaric oxygen therapy with some discomfort in the ears. 01/09/16; pressure area with underlying osteomyelitis in the left buttock. Wound bed itself has some slight amount of grayish surface slough however I do not think any debridement was necessary. There is no exposed bone soft tissue appears stable. She is using a wound VAC 01/16/16; back for weekly wound review in conjunction with HBO. She has a deep wound over the left initial tuberosity previously treated with 6 weeks of IV antibiotics for osteomyelitis. Wound bed looks reasonably healthy although the base of this is still precariously close to bone. She has been using a wound VAC. 01/23/2016 -- she has completed her course of antibiotics and this week the only new thing is her right great toe nail was avulsed and she has got an open wound over the nailbed. 01/31/16 she has completed her course of antibiotics. Her right great toenail avulsed last week and she's been using silver alginate for this as well. Still using a wound VAC to the substantial stage IV wound over the left ischial tuberosity 03/05/2016 -- the patient has had a opinion from the plastic surgery group at [facility] and details of this are not available yet but the patient's verbal report has been heard by me. Did not sound like there was any optimistic discussion regarding reconstruction and the net result would be to continue with the wound VAC application. I will await the official reports. Addendum: -- she was seen at [place] [facility] by Dr. Tressa Busman. After a thorough review and from what I understand spending 45 minutes with the patient his assessment has been noted by me in detail and the management options were: 1. Continued pressure  offloading and wound care versus operative procedures including wound excision 2. Soft tissue and bone sampling 3. If the wound gets larger wound closure would be done using a variety of plastic surgical techniques including but not limited to skin substitute, possible skin graft, local versus regional flaps, negative pressure dressing application. 4. He discussed with her details of flap surgery and the risks associated 5. He made a comment that since the patient was operated on by Dr. Leland Johns of [facility] plastic surgery unit in [place] the patient may continue to follow-up there for further evaluation for surgical flap closure in the future. 03/19/2016 -- the patient continues to be rather depressed and frustrated with her lack of rapid progress in healing this wound especially because she thought after hyperbaric oxygen therapy the wound would heal extremely fast. She now understands that was not the implied benefit on wound care which was  the recommendation for hyperbaric oxygen therapy. I have had a lengthy discussion with the patient and her husband regarding her options: 1. Continue with collagen and wound VAC for the primary dressing and offloading and all supportive care. 2. See Dr. Iran Planas for possible placement of Acell or Integra in the OR. 3. get a second opinion from a wound care center and surrounding regions/counties 05/07/2016 -- Note from Dr. Celedonio Miyamoto, who noted that the patient has declined flap surgery. She has discussed application of A cell, and try a few applications to see how the wound progresses. She is also recommended that we could apply products here in the wound center, like Oasis. during her preop workup it was found that her hemoglobin A1c was 11% and she has now been diagnosed as having diabetes mellitus and has been put on appropriate treatment by her PCP 05/28/2016 -- tells me her blood sugars have been doing well and she has an  appointment to see her PCP in the next couple of weeks to check her hemoglobin A1c. Other than that she continues to do well. 06/25/2016 -- have not seen her back for the last month but she says her health has been about the same and she has an appointment to check the A1c next week 09/10/16 ---- was seen by Dr. Celedonio Miyamoto -- who applied Acell and saw her back in follow-up. She has recommended silver alginate to the wound every other day and cover with foam. If no significant drainage could transition to collagen every other day. She recommended discontinuing wound VAC. There were no plans to repeat application of Acell. The patient expressed that her husband could do the wound care as going to the Wound Ctr., would cost several $100 for each visit. 10/21/2016 -- her insurance company is getting her new mattress and she is pleased about that. Other than that she has been doing dressings with PolyMem Silver and has been doing very well 02/18/2017 -- she has gone through several changes of her mattress and has not been pleased with any of them. The ventricles are still working on trying to fit her with the appropriate low air-loss mattress. She has a new wound on the gluteal area which is clearly separated from the original wound. 03/25/17-she is here in follow-up evaluation for her left ischialpressure ulcer. She remains unsatisfied with her pressure mattress. She admits to sitting multiple hours a day, in the bed. We have discussed offloading options. The wound does not appear infected. Nutrition does not appear to be a concern. Will follow-up in 4 weeks, if wound continues to be stalled may consider x-ray to evaluate for refractory osteomyelitis. 04/21/17; this is a patient that I don't know all that well. She has a chronic wound which at one point had underlying osteomyelitis in the left ischial tuberosity. This is a stage IV pressure ulcer. Over the last 3 months she has a stage II wound  inferiorly to the original wound. The last time she was here her dressing was changed to silver collagen although the patient's husband who changes the dressing said that the collagen stuck to the wound and remove skin from the superficial area therefore he switched back to [place] 05/13/17; this is a patient we've been following for a left ischial tuberosity wound which was stage IV at one point had underlying osteomyelitis. Over the last several months she's had a stage II wound just inferior and medial to the related to the wound. According to her husband  he is using Endoform layer with collagen although this is not what I had last time. According to her husband they are using Elgie [place] with collagen although I don't quite know how that started. She was hospitalized from 1/20 through 04/30/16. This was related to a UTI. Her blood cultures were negative, urine culture showed multiple species. She did have a CT scan of the abdomen and pelvis which documented chronic osteomyelitis in the area of the wound inflammatory markers were unremarkable. She has had prior knowledge of osteomyelitis. It looks as though she received IV antibiotics in 2017 and was treated with a course of hyperbaric oxygen. 05/28/17; the wound over the left ischial tuberosity is deeper today and abuts clearly on bone. Nursing intake reported drainage. I therefore culture of the wound. The more superficial area just below this looks about the same. They once again complained that there are mattress cover is not working although apparently [facility] is been noted to see this many times in the report is that the device is functional 06/18/17; the patient had a probing area on the left ischial tuberosity that was draining purulent fluid last time. This also clearly seemed to have open bone. Culture I did showed pansensitive pseudomonas including third generation cephalosporins. I treated this with cefdinir 300 twice a day  for 10 days and things seem to have improved. She has a more superficial wound just underneath this area. Amazingly she has a new air mattress through [facility]. I think they gave this to her as a parking give. In any case this now works according to the patient may have something to do with why the areas are looking better. 07/09/17; the patient has a probing area in the left ischial tuberosity that still has some depth. However this is contracted in terms of the wound orifice although the depth is still roughly the same. There is no undermining. ooShe also has the satellite wound which is more superficial. This appears to have a healthy surface we've been using silver collagen 08/06/17; the patient's wound is over the left ischial tuberosity and a satellite lesion just underneath this. The original wound was actually a deep stage 4 wound. We have made good progress in 2 months and there is no longer exposed bone here. 09/03/17; left ischial tuberosity actually appears to be quite healthy. I think we are making progress. No debridement is required. There is no surrounding erythema 10/01/17 I follow this patient monthly for her left ischial tuberosity wound. There is 2 areas the original area and a satellite area. The satellite area looks a lot better there is no surrounding erythema. Her husband relates that he is having trouble maintaining the dressing. This has to do with the soft tissue around it. He states he puts the collagen in but he cannot make sure that it stays in even with the ABD pads and tape that he is been using 10/29/17; patient arrives with a better looking noon today. Some of the satellite lesions have closed. using Prisma 11/26/17; the patient has a large cone-shaped area with the tip of the Cone deep within her buttock soft tissue. The walls of the Cone are epithelialized however the base is still open. The area at the base of this looks moist we've been using silver collagen.  Will change to silver alginate 12/31/2017; the wound appears to have come in fairly nicely. Using silver alginate. There is no surrounding maceration or infection 01/28/18; there is still an open area  here over the left initial tuberosity. Base of this however looks healthy. There is no surrounding infection 02/25/18; the area of its open is over the left ischial tuberosity. The base of this is where the wound is. This is a large inverted cone-shaped area with the wound at the tip. Dimensions of the wound at the tip are improved. There is a area of denuded skin about halfway towards the tip which her husband thinks may have happened today when he was bathing her. 04/20/17; the area is still open over the left initial tuberosity. 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 This is now completely closed over although it is closed over with a divot and skin over bone at the base of this. Continued aggressive offloading will be necessary. 12/30/2018 READMISSION This is a 70 year old woman with chronic paraplegia. I picked her up for her care from Dr. Con Memos in this clinic after he departed. She had a stage IV pressure wound over the left ischial tuberosity. She was treated twice for her underlying osteomyelitis and this I believe firstly in 2016 and again in 2017. There were some plans at some point for flap closure of this however she was discovered to have uncontrolled diabetes and I do not think this was ever accomplished. She ultimately healed over in this clinic and was discharged in May. She has a large cone-shaped indentation with the tip of this going towards the left ischial tuberosity. It is not an easy area to examine but at that time I thought all of this was epithelialized. Apparently there was a  reopening here shortly after she left the clinic last time. She was admitted to hospital at the end of June for Klebsiella bacteremia felt to be secondary to UTI. A CT scan of the pelvis is listed below and there was initially some concern that she had underlying osteomyelitis although I believe she was seen by infectious disease and that was felt to be not the case: I do not see any new cultures or inflammatory markers IMPRESSION: 1. No CT evidence for acute intra-abdominal or pelvic abnormality. Large volume of stool throughout the colon. 2. Enlarged fatty liver with fat sparing near the gallbladder fossa 3. Cortical scarring right kidney. Bilateral intrarenal stones without hydronephrosis. Thick-walled urinary bladder decompressed by suprapubic catheter 4. Deep left decubitus ulcer with underlying left ischial changes suggesting osteomyelitis. Her husband has been using silver collagen in the wound. She has not been systemically unwell no fever chills eating and drinking well. They rigorously offload this wound only getting up in the wheelchair when she is going to appointments the rest of the time she is in bed. 10/8; wound measures larger and she now has exposed bone. We have been using silver alginate 11/12 still using silver alginate. The patient saw Dr. Megan Salon of infectious disease. She was started on Augmentin 500 mg twice daily. She is due to follow-up with Dr. Megan Salon I believe next week. Lab work Dr. Megan Salon requested showed a sedimentation rate of 28 and CRP of 20 although her CRP 1 year ago was 18.8. Sedimentation rate 1 year ago was 11 basic metabolic panel showed a creatinine of 1.12 12/3; the patient followed up with Dr. Megan Salon yesterday. She is still on Augmentin twice daily. This was directed by Dr. Megan Salon.  The patient's inflammatory markers have improved which is gratifying. Her C-reactive protein was repeated yesterday and follow-up booked with infectious disease in  January. In addition I have been getting secure text messages I think from palliative care [facility] [employer]. I think they were hoping to provide services to the patient in her home. They could not get a hold of the primary physician and so they reached out to me on 2 separate occasions. 12/17; patient last saw Dr. Megan Salon on 12/2. She is finishing up with Augmentin. Her C-reactive protein was 20 on 10/21, 10.1 on 11/19 and 17 on 12/2. The wound itself still has depth and undermining. We are using Santyl with the backing wet-to-dry 04/27/2019. The wound is gradually clearing up in terms of the surface although it is not filled in that much. Still abuts right against bone 2/4; patient with a deep pressure ulcer over the left ischial tuberosity. I thought she was going to follow-up with infectious disease to follow her inflammatory markers although the patient states that they stated that they did not need to see her unless we felt it was necessary. I will need to check their notes. In any case we ordered moistened silver collagen back with wet-to-dry to fill in the depth of the wound although apparently [facility] sent silver alginate which they have been using since they were here the last time. Is obviously not what we ordered. 2/25. Not much change in this wound it is over the left ischial tuberosity recurrent wound. We have been using silver collagen with backing wet-to-dry. I think the wound is about the same. There is still some tunneling from about 10-12 o'clock over the ischial tuberosity itself 3/11; pressure ulcer over the left ischial tuberosity. Since she was last here the wound VAC was started and apparently going quite well. We are able to get the home health company that accepts [place] healthcare which is in itself sometimes problematic. There is been improvements in the wound the tunneling seems to be better and is contracted nicely 4/8; 1 month follow-up. Since  she was last here we have been using silver collagen under a wound VAC. Some minor contraction I think in wound volume. She is cared for diligently by her husband including pressure relief, incontinence management, nutritional support etc. 6/1; this is almost a 53-month follow-up. She is been using silver collagen under wound VAC. Circular area over the left ischial tuberosity. She has been using silver collagen under wound VAC 7/8; 1 month follow-up. Silver collagen under the VAC not really a lot of progress. Tissue at the base of the wound which is right against bone and the tissue next that this does not look completely viable. She is not currently on any antibiotics, she had underlying osteomyelitis I need to look this over 8/16; we are using silver collagen under wound VAC to the left ischial tuberosity wound. Comes in today with absolutely no change in surface area or depth. There is no exposed bone. I did look over her infectious disease notes as I said I would do last time. She last saw Dr. Megan Salon in December 2020. She completed 6 weeks of Augmentin. This was in response to a bone culture I did showing methicillin susceptible staph aureus and Enterococcus. She was supposed to come back to see Dr. Megan Salon at some point although they say that that appointment was canceled unless I chose to recommend return. I think there was supposed to be follow-up with inflammatory  markers but I cannot see that that was ever done. She has not been on antibiotics since 9/21; monthly follow-up. We received a call from home health nurse last evening to report green drainage coming out of the wound. Lab work I ordered last time showed a white count of 5.2 a sedimentation rate of 45 and a C-reactive protein of 25 however neither one of the 2 values are substantially different from her previous values in October 2020 or December 2020. Both are slightly higher but only marginally. Otherwise no new complaints from the  patient or her husband 10/19; 1 month follow-up. PCR culture I did last time showed medium quantities of Pseudomonas lower quantities Klebsiella and Enterococcus faecalis group B strep and Peptostreptococcus. I gave her Augmentin for 2 weeks. I am not really sure of my choice of this I would not cover Pseudomonas. She is still having green drainage. Wound itself looks satisfactory there is not a lot of depth wound bed looks healthy 11/16; patient has completed the antibiotics still using gentamicin and silver alginate on the wound. There is improvement in the surface area 12/21; in general the area on the buttock looks somewhat better. Surface looks healthy although I do not know that there is been much improvement in the wound volume. We have been using silver alginate and Hydrofera Blue. Less drainage. In passing the husband showed me an abrasion injury on the left anterior tibia. Covered in necrotic surface. He has noticed this for about a week and has been putting silver collagen on it. He is completely uncertain about how this happened 1/25; monthly follow-up. The area on the left buttock is about the same. This does not go to bone but a fairly deep wound surface of the wound is of questionable viability. The abrasion injury that they showed me last time apparently was closed out by home health because they thought it was healed but certainly is not although it is just about healed. As a result they haven't been applying anything to this area Finally I did discuss with the patient and her husband the idea of an advanced treatment product to try and get a proper base to this wound I was thinking of Puraply however actually the patient points out that her co-pay for coming to visit [place] i.e. the facility, charge would be unaffordable if they have to, on a weekly basis 2/22; pressure ulcer on the left buttock appears deeper to me and abuts on the ischial tuberosity. I thought initially there was  exposed bone but there is a rim of tissue over this area. She also has a superficial over the right anterior mid tibia. Been using silver collagen to these areas without much success. I have looked over the patient's past history with regards to the area on the left ischium. She did have underlying osteomyelitis here dating back I think to late 2020. She saw Dr. Megan Salon she received a 6-week course of oral antibiotics in response to a bone culture that I did. This does not appear to be infected but it certainly has not been improving in terms of granulation. I do not believe she has had any recent imaging studies 3/29; 1 month follow-up. Pressure ulcer on the left buttock which she has been dealing with with for a number of years. She was treated for underlying osteomyelitis at 1.2 or 3 years ago I think with infectious disease help. She also had I think a flap closure by Dr. Leland Johns and that lasted for  about a year and then reopened. I have not been able to get this patient to progress towards healing although truthfully the wound is absolutely no worse. We have been using Hydrofera Blue 4/26; patient presents for 1 week follow-up. She has been using silver collagen to the area every other day. She has home health that comes out once a week to help with dressing changes as well. The patient is interested in trying a skin substitute over this area. She states she is trying to relieve pressure off of it most of the day. 5/24; patient presents for 1 month follow-up. She has been using silver collagen to the area every other day. She has no complaints today. She is interested in the skin substitute. She tries to leave relieve pressure off Her bottom however is not able to most of the day 6/14; using silver collagen to the area over the left ischial tuberosity. Wound does not appear to be doing particularly well. Open to bone 6/28; the patient patient presented last time with a marked deterioration.  Depth probing all the way to bone. The bone itself did not look particularly viable. In spite of this the x-ray I did showed longstanding ulcer over the left ischial tuberosity with chronic bone involvement/reaction that was also seen by CT in 2020. Lab work did not show just active infection with a sed rate of 14 and a C-reactive protein of 7.1. Her comprehensive metabolic panel was normal including an albumin of 4.1 white count was 6 7/19; patient was here 3 weeks ago with a marked deterioration in her wound over the left ischial tuberosity. I ordered a CT scan of the area for 1 reason or another this just did not get done. It is now booked for 8 days from now. She was supposed to come back for bone culture and pathology. That did not happen either. We have been using silver collagen. As usual she is diligently looked after by her husband 8/24; 5-week follow-up. Since the patient was last here the biopsy that I did of her ischial tuberosity came back suggesting osteomyelitis. Culture showed strep. I started her on Augmentin. She was seen by infectious disease Dr. Lucianne Lei dam and the Augmentin was continued and she is still taking it. CT scan did not suggest anything other than chronic osteomyelitis without much change from her previous study. Her C-reactive protein was only 7 and sedimentation rate was 1.1. We are still using silver collagen to the wound. She has home health. Her husband is very diligent in her care for this reason I have never pursued a diverting colostomy. She would not agree to this surgery in any case. Finally we have discussed plastic surgery with him in the past and she is not interested in a myocutaneous flap. 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 Since she was last here she was in the ER last week with abdominal pain. She was found to be impacted however in the course of the review there somebody gave her some IV morphine and apparently she developed hives and blisters.  There is still tense blisters on her plantar left fifth and fourth fingers 10/4; the patient has completed her Augmentin and is due to follow-up with Dr. Drucilla Schmidt in early November. Her wound today measures about the same but looks a little healthier in terms of granulation there is no exposed bone. I note that she was admitted to hospital for 2 days from 9/21 through 9/23 for delirium. She had  received Versed for glaucoma surgery ultimately that was felt to be the etiology. Her blood cultures were negative she had 30,000 colonies of Pseudomonas in her urine. She did receive broad-spectrum antibiotic therapy but ultimately her wound on the buttock was not felt to be the cause. As mentioned she has completed her antibiotics still using silver collagen 11/1; patient has completed her antibiotics for the underlying osteomyelitis. She apparently follows up with Dr. Drucilla Schmidt next week. The wound does not look too bad perhaps slightly narrower in terms of width but the depth is about the same. We have been using Hydrofera Blue. The patient talks to me about a wound VAC and made it sound as though she was recently on 1 although I do not see this. The other option is an advanced treatment product like Oasis but that means weekly trips into the clinic. She has previously said she does not want an attempt at plastic surgery 11/15; the patient saw Dr. Drucilla Schmidt noted that her follow-up inflammatory markers normalized. She has completed her antibiotics. We are currently using Hydrofera Blue. Wound itself has some surface tissue over bone but certainly not a lot. I have looked back over her history. The patient has had recurrent osteomyelitis in this area as she is received prolonged courses of antibiotics. We did use a wound VAC for a prolonged period of time in 2021. We had some improvement especially in undermining areas but overall not a lot of measurable improvement. Once again I have tried to think about using  advanced treatment products in this area something that would require them visiting the clinic very frequently and they did not seem to want to do this. Most recently we ran [place] however she would have a $263 per application co-pay 33/5; left ischial tuberosity. I do not see much difference in 3 weeks. There is no exposed bone. We are using Hydrofera Blue. Our intake nurse reports some "greenish" drainage 12/21 left ischial tuberosity. Measuring slightly smaller in surface area. The wound still has some tissue over bone i.e. there is no exposed bone. No overt infection. We did a deep tissue culture last time for PCR. The major bacteria was Pseudomonas although there were medium titers of Enterococcus faecalis Klebsiella E. coli and Morganella as well as Peptostreptococcus. Keystone antibiotic include streptomycin and vancomycin they got this last weekend and used it twice Objective Constitutional Sitting or standing Blood Pressure is within target range for patient.. Pulse regular and within target range for patient.Marland Kitchen Respirations regular, non-labored and within target range.. Temperature is normal and within the target range for the patient.Marland Kitchen Appears in no distress. Vitals Time Taken: 9:22 AM, Height: 63 in, Weight: 185 lbs, BMI: 32.8, Temperature: 98.3 F, Pulse: 93 bpm, Respiratory Rate: 18 breaths/min, Blood Pressure: 103/66 mmHg. General Notes: Wound exam; slightly smaller in terms of width. I think the depth is about the same. There is tissue over bone but only a thin layer. There is no surrounding infection no erythema nothing needs culturing. Integumentary (Hair, Skin) Wound #12 status is Open. Original cause of wound was Pressure Injury. The date acquired was: 09/06/2018. The wound has been in treatment 116 weeks. The wound is located on the Left Ischial Tuberosity. The wound measures 2.7cm length x 1cm width x 0.9cm depth; 2.121cm^2 area and 1.909cm^3 volume. There is Fat Layer  (Subcutaneous Tissue) exposed. There is no tunneling noted, however, there is undermining starting at 12:00 and ending at 12:00 with a maximum distance of 0.5cm. There is a  medium amount of serosanguineous drainage noted. The wound margin is well defined and not attached to the wound base. There is medium (34-66%) red, pink granulation within the wound bed. There is a medium (34-66%) amount of necrotic tissue within the wound bed including Adherent Slough. 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 - Dr. Dellia Nims Bathing/ Shower/ Hygiene: May shower and wash wound with soap and water. - with dressing changes Edema Control - Lymphedema / SCD / Other: Elevate legs to the level of the heart or above for 30 minutes daily and/or when sitting, a frequency of: - throughout the day Avoid standing for long periods of time. Off-Loading: Turn and reposition every 2 hours Home Health: No change in wound care orders this week; continue Home Health for wound care. May utilize formulary equivalent dressing for wound treatment orders unless otherwise specified. Other Home Health Orders/Instructions: - [facility] WOUND #12: - Ischial Tuberosity Wound Laterality: Left Cleanser: Soap and Water (Home Health) 1 x Per Day/30 Days Discharge Instructions: May shower and wash wound with dial antibacterial soap and water prior to dressing change. Cleanser: Wound Cleanser (Home Health) 1 x Per Day/30 Days Discharge Instructions: Cleanse the wound with wound cleanser prior to applying a clean dressing using gauze sponges, not tissue or cotton balls. Peri-Wound Care: Skin Prep (Home Health) 1 x Per Day/30 Days Discharge Instructions: Use skin prep as directed Topical: Keystone antibiotic compound 1 x Per Day/30 Days Discharge Instructions: Apply antibiotic compound to wound bed,  under silver alginate Prim Dressing: KerraCel Ag Gelling Fiber Dressing, 2x2 in (silver alginate) (Home Health) 1 x Per Day/30 Days ary Discharge Instructions: Apply silver alginate to wound bed as instructed Secondary Dressing: ComfortFoam Border, 4x4 in (silicone border) ([place]) 1 x Per Day/30 Days Discharge Instructions: Apply over primary dressing as directed. 1. I have continued with the [facility] antibiotic with silver alginate 2. As usual they are doing their best to offload this 3. I had a long conversation with him about other options here including wound VAC, advanced tissue product [Oasis], up to and including plastic surgery. I have asked him to talk about this over the holidays and we will look at these options in the new year. In the past the patient and her husband have had difficulties with each 1 of these options for various reasons including financial reasons but I told them I wanted to have them think about it. Otherwise we are going to continue with the standard wound dressing options Electronic Signature(s) Signed: 03/27/2021 4:11:41 PM By: Linton Ham MD Entered By: Linton Ham on 03/27/2021 09:54:02 -------------------------------------------------------------------------------- SuperBill Details Patient Name: Date of Service: [name]. 03/27/2021 Medical Record Number: [id number] Patient Account Number: [id number] Date of Birth/Sex: Treating RN: 06-10-1950 (70 y.o. [name], [name] Primary Care Provider: [name] Other Clinician: Referring Provider: Treating Provider/Extender: Tommy Rainwater in Treatment: 116 Diagnosis Coding ICD-10 Codes Code Description [phone] Pressure ulcer of left buttock, stage 4 E11.622 Type 2 diabetes mellitus with other skin ulcer Facility Procedures The patient participates with Medicare or their insurance follows the Medicare Facility Guidelines: CPT4 Code Description Modifier Quantity  [id number] [facility] VISIT-LEV 3 EST PT 1 Physician Procedures : CPT4 Code Description Modifier [id number] - WC PHYS LEVEL 4 - EST PT ICD-10 Diagnosis Description 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, stage 4 Quantity:  1 Electronic Signature(s) Signed: 03/28/2021 3:43:54 PM By: Linton Ham MD Signed: 03/28/2021 4:57:12 PM By: Levan Hurst RN, BSN Previous Signature: 03/27/2021 4:11:41 PM Version By: Linton Ham MD Entered By: Levan Hurst on 03/27/2021 16:33:28

## 2021-04-02 NOTE — Progress Notes (Signed)
GREGG, HOLSTER ([id number]) Visit Report for 03/27/2021 Arrival Information Details Patient Name: Date of Service: [name], [name] 03/27/2021 9:15 A M Medical Record Number: [id number] Patient Account Number: [id number] Date of Birth/Sex: Treating RN: 1951/02/14 (70 y.o. Benjamine Sprague, Briant Cedar Primary Care Maleia Weems: [name] Other Clinician: Referring Mahamed Zalewski: Treating Delphia Kaylor/Extender: Tommy Rainwater in Treatment: 16 Visit Information History Since Last Visit Added or deleted any medications: No Patient Arrived: Wheel Chair Any new allergies or adverse reactions: No Arrival Time: 09:21 Had a fall or experienced change in No Accompanied By: husband activities of daily living that may affect Transfer Assistance: None risk of falls: Patient Identification Verified: Yes Signs or symptoms of abuse/neglect since last visito No Secondary Verification Process Completed: Yes Hospitalized since last visit: No Patient Requires Transmission-Based Precautions: No Implantable device outside of the clinic excluding No Patient Has Alerts: No cellular tissue based products placed in the center since last visit: Has Dressing in Place as Prescribed: Yes Pain Present Now: No Electronic Signature(s) Signed: 03/28/2021 4:57:12 PM By: Levan Hurst RN, BSN Entered By: Levan Hurst on 03/27/2021 09:21:40 -------------------------------------------------------------------------------- Clinic Level of Care Assessment Details Patient Name: Date of Service: [name], [name] 03/27/2021 9:15 A M Medical Record Number: [id number] Patient Account Number: [id number] Date of Birth/Sex: Treating RN: 10/27/1950 (70 y.o. Benjamine Sprague, Briant Cedar Primary Care Letina Luckett: [name] Other Clinician: Referring Aryanna Shaver: Treating Hortensia Duffin/Extender: Tommy Rainwater in Treatment: 116 Clinic Level of Care Assessment Items TOOL 4 Quantity Score X- 1 0 Use when only an EandM is  performed on FOLLOW-UP visit ASSESSMENTS - Nursing Assessment / Reassessment X- 1 10 Reassessment of Co-morbidities (includes updates in patient status) X- 1 5 Reassessment of Adherence to Treatment Plan ASSESSMENTS - Wound and Skin A ssessment / Reassessment X - Simple Wound Assessment / Reassessment - one wound 1 5 []  - 0 Complex Wound Assessment / Reassessment - multiple wounds []  - 0 Dermatologic / Skin Assessment (not related to wound area) ASSESSMENTS - Focused Assessment []  - 0 Circumferential Edema Measurements - multi extremities []  - 0 Nutritional Assessment / Counseling / Intervention []  - 0 Lower Extremity Assessment (monofilament, tuning fork, pulses) []  - 0 Peripheral Arterial Disease Assessment (using hand held doppler) ASSESSMENTS - Ostomy and/or Continence Assessment and Care []  - 0 Incontinence Assessment and Management []  - 0 Ostomy Care Assessment and Management (repouching, etc.) PROCESS - Coordination of Care X - Simple Patient / Family Education for ongoing care 1 15 []  - 0 Complex (extensive) Patient / Family Education for ongoing care X- 1 10 Staff obtains Programmer, systems, Records, T Results / Process Orders est X- 1 10 Staff telephones HHA, Nursing Homes / Clarify orders / etc []  - 0 Routine Transfer to another Facility (non-emergent condition) []  - 0 Routine Hospital Admission (non-emergent condition) []  - 0 New Admissions / Biomedical engineer / Ordering NPWT Apligraf, etc. , []  - 0 Emergency Hospital Admission (emergent condition) X- 1 10 Simple Discharge Coordination []  - 0 Complex (extensive) Discharge Coordination PROCESS - Special Needs []  - 0 Pediatric / Minor Patient Management []  - 0 Isolation Patient Management []  - 0 Hearing / Language / Visual special needs []  - 0 Assessment of Community assistance (transportation, D/C planning, etc.) []  - 0 Additional assistance / Altered mentation []  - 0 Support Surface(s) Assessment  (bed, cushion, seat, etc.) INTERVENTIONS - Wound Cleansing / Measurement X - Simple Wound Cleansing - one wound 1 5 []  - 0 Complex  Wound Cleansing - multiple wounds X- 1 5 Wound Imaging (photographs - any number of wounds) []  - 0 Wound Tracing (instead of photographs) X- 1 5 Simple Wound Measurement - one wound []  - 0 Complex Wound Measurement - multiple wounds INTERVENTIONS - Wound Dressings X - Small Wound Dressing one or multiple wounds 1 10 []  - 0 Medium Wound Dressing one or multiple wounds []  - 0 Large Wound Dressing one or multiple wounds []  - 0 Application of Medications - topical []  - 0 Application of Medications - injection INTERVENTIONS - Miscellaneous []  - 0 External ear exam []  - 0 Specimen Collection (cultures, biopsies, blood, body fluids, etc.) []  - 0 Specimen(s) / Culture(s) sent or taken to Lab for analysis []  - 0 Patient Transfer (multiple staff / Civil Service fast streamer / Similar devices) []  - 0 Simple Staple / Suture removal (25 or less) []  - 0 Complex Staple / Suture removal (26 or more) []  - 0 Hypo / Hyperglycemic Management (close monitor of Blood Glucose) []  - 0 Ankle / Brachial Index (ABI) - do not check if billed separately X- 1 5 Vital Signs Has the patient been seen at the hospital within the last three years: Yes Total Score: 95 Level Of Care: New/Established - Level 3 Electronic Signature(s) Signed: 03/28/2021 4:57:12 PM By: Levan Hurst RN, BSN Entered By: Levan Hurst on 03/27/2021 16:33:20 -------------------------------------------------------------------------------- Encounter Discharge Information Details Patient Name: Date of Service: [name]. 03/27/2021 9:15 A M Medical Record Number: [id number] Patient Account Number: [id number] Date of Birth/Sex: Treating RN: July 04, 1950 (70 y.o. [name] Primary Care Lowella Kindley: [name] Other Clinician: Referring Christionna Poland: Treating Ceniya Fowers/Extender: Tommy Rainwater in Treatment: 116 Encounter Discharge Information Items Discharge Condition: Stable Ambulatory Status: Wheelchair Discharge Destination: Home Transportation: Private Auto Accompanied By: husband Schedule Follow-up Appointment: Yes Clinical Summary of Care: Patient Declined Electronic Signature(s) Signed: 03/28/2021 4:57:12 PM By: Levan Hurst RN, BSN Entered By: Levan Hurst on 03/27/2021 16:34:35 -------------------------------------------------------------------------------- Multi Wound Chart Details Patient Name: Date of Service: [name]. 03/27/2021 9:15 A M Medical Record Number: [id number] Patient Account Number: [id number] Date of Birth/Sex: Treating RN: March 20, 1951 (70 y.o. [name], [name] Primary Care Kendyl Festa: [name] Other Clinician: Referring Manolito Jurewicz: Treating Carlas Vandyne/Extender: Tommy Rainwater in Treatment: 116 Vital Signs Height(in): 63 Pulse(bpm): 93 Weight(lbs): 185 Blood Pressure(mmHg): 103/66 Body Mass Index(BMI): 33 Temperature(F): 98.3 Respiratory Rate(breaths/min): 18 Photos: [12:Left Ischial Tuberosity] [N/A:N/A N/A] Wound Location: [12:Pressure Injury] [N/A:N/A] Wounding Event: [12:Pressure Ulcer] [N/A:N/A] Primary Etiology: [12:Anemia, Hypertension, Type II] [N/A:N/A] Comorbid History: [12:Diabetes, Osteoarthritis, Dementia, Paraplegia, Received Radiation 09/06/2018] [N/A:N/A] Date Acquired: [12:116] [N/A:N/A] Weeks of Treatment: [12:Open] [N/A:N/A] Wound Status: [12:2.7x1x0.9] [N/A:N/A] Measurements L x W x D (cm) [12:2.121] [N/A:N/A] A (cm) : rea [12:1.909] [N/A:N/A] Volume (cm) : [12:-798.70%] [N/A:N/A] % Reduction in A rea: [12:-800.50%] [N/A:N/A] % Reduction in Volume: [12:12] Starting Position 1 (o'clock): [12:12] Ending Position 1 (o'clock): [12:0.5] Maximum Distance 1 (cm): [12:Yes] [N/A:N/A] Undermining: [12:Category/Stage IV] [N/A:N/A] Classification: [12:Medium] [N/A:N/A] Exudate A  mount: [12:Serosanguineous] [N/A:N/A] Exudate Type: [12:red, brown] [N/A:N/A] Exudate Color: [12:Well defined, not attached] [N/A:N/A] Wound Margin: [12:Medium (34-66%)] [N/A:N/A] Granulation A mount: [12:Red, Pink] [N/A:N/A] Granulation Quality: [12:Medium (34-66%)] [N/A:N/A] Necrotic A mount: [12:Fat Layer (Subcutaneous Tissue): Yes N/A] Exposed Structures: [12:Fascia: No Tendon: No Muscle: No Joint: No Bone: No Small (1-33%)] [N/A:N/A] Treatment Notes Electronic Signature(s) Signed: 03/27/2021 4:11:41 PM By: Linton Ham MD Signed: 04/02/2021 1:16:02 PM By: Rhae Hammock RN Entered By: Linton Ham  on 03/27/2021 09:49:25 -------------------------------------------------------------------------------- [place] Details Patient Name: Date of Service: [name], [name] 03/27/2021 9:15 A M Medical Record Number: [id number] Patient Account Number: [id number] Date of Birth/Sex: Treating RN: 10/30/1950 (70 y.o. [name] Primary Care Jostin Rue: [name] Other Clinician: Referring Myeisha Kruser: Treating Ankur Snowdon/Extender: Tommy Rainwater in Treatment: 116 Active Inactive Wound/Skin Impairment Nursing Diagnoses: Knowledge deficit related to ulceration/compromised skin integrity Goals: Patient/caregiver will verbalize understanding of skin care regimen Date Initiated: 12/30/2018 Target Resolution Date: 04/12/2021 Goal Status: Active Interventions: Assess patient/caregiver ability to obtain necessary supplies Assess patient/caregiver ability to perform ulcer/skin care regimen upon admission and as needed Assess ulceration(s) every visit Provide education on ulcer and skin care Treatment Activities: Skin care regimen initiated : 12/30/2018 Topical wound management initiated : 12/30/2018 Notes: 01/08/21: Wound care regimen continues Electronic Signature(s) Signed: 03/28/2021 4:57:12 PM By: Levan Hurst RN, BSN Entered By: Levan Hurst on 03/27/2021 09:37:32 -------------------------------------------------------------------------------- Pain Assessment Details Patient Name: Date of Service: [name]. 03/27/2021 9:15 A M Medical Record Number: [id number] Patient Account Number: [id number] Date of Birth/Sex: Treating RN: 10-Aug-1950 (70 y.o. [name] Primary Care Klarissa Mcilvain: [name] Other Clinician: Referring Toyia Jelinek: Treating  [place]/Extender: Tommy Rainwater in Treatment: 116 Active Problems Location of Pain Severity and Description of Pain Patient Has Paino No Site Locations Pain Management and Medication Current Pain Management: Electronic Signature(s) Signed: 03/28/2021 4:57:12 PM By: Levan Hurst RN, BSN Entered By: Levan Hurst on 03/27/2021 09:31:51 -------------------------------------------------------------------------------- Patient/Caregiver Education Details Patient Name: Date of Service: [name] 12/21/2022andnbsp9:15 [place] Record Number: [id number] Patient Account Number: [id number] Date of Birth/Gender: Treating RN: 1950-06-22 (70 y.o. [name] Primary Care Physician: [name] Other Clinician: Referring Physician: Treating Physician/Extender: Tommy Rainwater in Treatment: 116 Education Assessment Education Provided To: Patient Education Topics Provided Wound/Skin Impairment: Methods: Explain/Verbal Responses: State content correctly [employer]) Signed: 03/28/2021 4:57:12 PM By: Levan Hurst RN, BSN Entered By: Levan Hurst on 03/27/2021 09:37:48 -------------------------------------------------------------------------------- Wound Assessment Details Patient Name: Date of Service: [name]. 03/27/2021 9:15 A M Medical Record Number: [id number] Patient Account Number: [id number] Date of Birth/Sex: Treating RN: 1950-06-04 (70 y.o. Benjamine Sprague, Briant Cedar Primary Care  Roger Kettles: [name] Other Clinician: Referring Vallory Oetken: Treating Aubriegh Minch/Extender: Tommy Rainwater in Treatment: 116 Wound Status Wound Number: 12 Primary Pressure Ulcer Etiology: Wound Location: Left Ischial Tuberosity Wound Open Wounding Event: Pressure Injury Status: Date Acquired: 09/06/2018 Comorbid Anemia, Hypertension, Type II Diabetes, Osteoarthritis, Weeks Of Treatment: 116 History: Dementia, Paraplegia, Received Radiation Clustered Wound: No Photos Wound Measurements Length: (cm) 2.7 Width: (cm) 1 Depth: (cm) 0.9 Area: (cm) 2.121 Volume: (cm) 1.909 % Reduction in Area: -798.7% % Reduction in Volume: -800.5% Epithelialization: Small (1-33%) Tunneling: No Undermining: Yes Starting Position (o'clock): 12 Ending Position (o'clock): 12 Maximum Distance: (cm) 0.5 Wound Description Classification: Category/Stage IV Wound Margin: Well defined, not attached Exudate Amount: Medium Exudate Type: Serosanguineous Exudate Color: red, brown Foul Odor After Cleansing: No Slough/Fibrino Yes Wound Bed Granulation Amount: Medium (34-66%) Exposed Structure Granulation Quality: Red, Pink Fascia Exposed: No Necrotic Amount: Medium (34-66%) Fat Layer (Subcutaneous Tissue) Exposed: Yes Necrotic Quality: Adherent Slough Tendon Exposed: No Muscle Exposed: No Joint Exposed: No Bone Exposed: No Treatment Notes Wound #12 (Ischial Tuberosity) Wound Laterality: Left Cleanser Soap and Water Discharge Instruction: May shower and wash wound with dial antibacterial soap and water prior to dressing change. Wound Cleanser Discharge Instruction:  Cleanse the wound with wound cleanser prior to applying a clean dressing using gauze sponges, not tissue or cotton balls. Peri-Wound Care Skin Prep Discharge Instruction: Use skin prep as directed Topical Keystone antibiotic compound Discharge Instruction: Apply antibiotic compound to wound bed, under silver  alginate Primary Dressing KerraCel Ag Gelling Fiber Dressing, 2x2 in (silver alginate) Discharge Instruction: Apply silver alginate to wound bed as instructed Secondary Dressing ComfortFoam Border, 4x4 in (silicone border) Discharge Instruction: Apply over primary dressing as directed. Secured With Compression Wrap Compression Stockings Environmental education officer) Signed: 03/28/2021 4:57:12 PM By: Levan Hurst RN, BSN Entered By: Levan Hurst on 03/27/2021 09:31:22 -------------------------------------------------------------------------------- Vitals Details Patient Name: Date of Service: [name]. 03/27/2021 9:15 A M Medical Record Number: [id number] Patient Account Number: [id number] Date of Birth/Sex: Treating RN: 01-31-1951 (70 y.o. Benjamine Sprague, Briant Cedar Primary Care Trentyn Boisclair: [name] Other Clinician: Referring Marshella Tello: Treating Rizwan Kuyper/Extender: Tommy Rainwater in Treatment: 116 Vital Signs Time Taken: 09:22 Temperature (F): 98.3 Height (in): 63 Pulse (bpm): 93 Weight (lbs): 185 Respiratory Rate (breaths/min): 18 Body Mass Index (BMI): 32.8 Blood Pressure (mmHg): 103/66 Reference Range: 80 - 120 mg / dl Electronic Signature(s) Signed: 03/28/2021 4:57:12 PM By: Levan Hurst RN, BSN Entered By: Levan Hurst on 03/27/2021 09:23:00

## 2021-04-17 ENCOUNTER — Other Ambulatory Visit: Payer: Self-pay

## 2021-04-17 ENCOUNTER — Encounter (HOSPITAL_BASED_OUTPATIENT_CLINIC_OR_DEPARTMENT_OTHER): Payer: Medicare Other | Attending: Internal Medicine | Admitting: Internal Medicine

## 2021-04-17 DIAGNOSIS — G8220 Paraplegia, unspecified: Secondary | ICD-10-CM | POA: Diagnosis not present

## 2021-04-17 DIAGNOSIS — I10 Essential (primary) hypertension: Secondary | ICD-10-CM | POA: Insufficient documentation

## 2021-04-17 DIAGNOSIS — Z853 Personal history of malignant neoplasm of breast: Secondary | ICD-10-CM | POA: Diagnosis not present

## 2021-04-17 DIAGNOSIS — E119 Type 2 diabetes mellitus without complications: Secondary | ICD-10-CM | POA: Diagnosis not present

## 2021-04-17 DIAGNOSIS — Z87891 Personal history of nicotine dependence: Secondary | ICD-10-CM | POA: Diagnosis not present

## 2021-04-17 DIAGNOSIS — L89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, stage 4: Secondary | ICD-10-CM | POA: Diagnosis present

## 2021-04-18 NOTE — Progress Notes (Signed)
DENE, LANDSBERG ([id number]) Visit Report for 04/17/2021 Arrival Information Details Patient Name: Date of Service: [name], [name] 04/17/2021 10:45 A M Medical Record Number: [id number] Patient Account Number: [id number] Date of Birth/Sex: Treating RN: May 07, 1950 (71 y.o. [name] Primary Care Maribel Luis: [name] Other Clinician: Referring Robt Okuda: Treating Alisha Burgo/Extender: Tommy Rainwater in Treatment: 3 Visit Information History Since Last Visit Added or deleted any medications: No Patient Arrived: Wheel Chair Any new allergies or adverse reactions: No Arrival Time: 10:50 Had a fall or experienced change in No Accompanied By: spouse activities of daily living that may affect Transfer Assistance: Manual risk of falls: Patient Requires Transmission-Based Precautions: No Signs or symptoms of abuse/neglect since last visito No Patient Has Alerts: No Hospitalized since last visit: No Implantable device outside of the clinic excluding No cellular tissue based products placed in the center since last visit: Has Dressing in Place as Prescribed: Yes Pain Present Now: No Electronic Signature(s) Signed: 04/17/2021 5:26:08 PM By: Dellie Catholic RN Entered By: Dellie Catholic on 04/17/2021 10:58:59 -------------------------------------------------------------------------------- Clinic Level of Care Assessment Details Patient Name: Date of Service: [name] 04/17/2021 10:45 A M Medical Record Number: [id number] Patient Account Number: [id number] Date of Birth/Sex: Treating RN: 05-Jun-1950 (71 y.o. Benjamine Sprague, Briant Cedar Primary Care Jase Himmelberger: [name] Other Clinician: Referring Lynell Greenhouse: Treating Jahrel Borthwick/Extender: Tommy Rainwater in Treatment: 119 Clinic Level of Care Assessment Items TOOL 4 Quantity Score X- 1 0 Use when only an EandM is performed on FOLLOW-UP visit ASSESSMENTS - Nursing Assessment / Reassessment X- 1  10 Reassessment of Co-morbidities (includes updates in patient status) X- 1 5 Reassessment of Adherence to Treatment Plan ASSESSMENTS - Wound and Skin A ssessment / Reassessment X - Simple Wound Assessment / Reassessment - one wound 1 5 []  - 0 Complex Wound Assessment / Reassessment - multiple wounds []  - 0 Dermatologic / Skin Assessment (not related to wound area) ASSESSMENTS - Focused Assessment []  - 0 Circumferential Edema Measurements - multi extremities []  - 0 Nutritional Assessment / Counseling / Intervention []  - 0 Lower Extremity Assessment (monofilament, tuning fork, pulses) []  - 0 Peripheral Arterial Disease Assessment (using hand held doppler) ASSESSMENTS - Ostomy and/or Continence Assessment and Care []  - 0 Incontinence Assessment and Management []  - 0 Ostomy Care Assessment and Management (repouching, etc.) PROCESS - Coordination of Care X - Simple Patient / Family Education for ongoing care 1 15 []  - 0 Complex (extensive) Patient / Family Education for ongoing care X- 1 10 Staff obtains Programmer, systems, Records, T Results / Process Orders est X- 1 10 Staff telephones HHA, Nursing Homes / Clarify orders / etc []  - 0 Routine Transfer to another Facility (non-emergent condition) []  - 0 Routine Hospital Admission (non-emergent condition) []  - 0 New Admissions / Biomedical engineer / Ordering NPWT Apligraf, etc. , []  - 0 Emergency Hospital Admission (emergent condition) X- 1 10 Simple Discharge Coordination []  - 0 Complex (extensive) Discharge Coordination PROCESS - Special Needs []  - 0 Pediatric / Minor Patient Management []  - 0 Isolation Patient Management []  - 0 Hearing / Language / Visual special needs []  - 0 Assessment of Community assistance (transportation, D/C planning, etc.) []  - 0 Additional assistance / Altered mentation []  - 0 Support Surface(s) Assessment (bed, cushion, seat, etc.) INTERVENTIONS - Wound Cleansing / Measurement X - Simple  Wound Cleansing - one wound 1 5 []  - 0 Complex Wound Cleansing - multiple wounds X- 1 5 Wound Imaging (  photographs - any number of wounds) []  - 0 Wound Tracing (instead of photographs) X- 1 5 Simple Wound Measurement - one wound []  - 0 Complex Wound Measurement - multiple wounds INTERVENTIONS - Wound Dressings X - Small Wound Dressing one or multiple wounds 1 10 []  - 0 Medium Wound Dressing one or multiple wounds []  - 0 Large Wound Dressing one or multiple wounds []  - 0 Application of Medications - topical []  - 0 Application of Medications - injection INTERVENTIONS - Miscellaneous []  - 0 External ear exam []  - 0 Specimen Collection (cultures, biopsies, blood, body fluids, etc.) []  - 0 Specimen(s) / Culture(s) sent or taken to Lab for analysis []  - 0 Patient Transfer (multiple staff / Civil Service fast streamer / Similar devices) []  - 0 Simple Staple / Suture removal (25 or less) []  - 0 Complex Staple / Suture removal (26 or more) []  - 0 Hypo / Hyperglycemic Management (close monitor of Blood Glucose) []  - 0 Ankle / Brachial Index (ABI) - do not check if billed separately X- 1 5 Vital Signs Has the patient been seen at the hospital within the last three years: Yes Total Score: 95 Level Of Care: New/Established - Level 3 Electronic Signature(s) Signed: 04/17/2021 5:58:46 PM By: Levan Hurst RN, BSN Entered By: Levan Hurst on 04/17/2021 17:06:33 -------------------------------------------------------------------------------- Encounter Discharge Information Details Patient Name: Date of Service: [name] Pulse M. 04/17/2021 10:45 A M Medical Record Number: [id number] Patient Account Number: [id number] Date of Birth/Sex: Treating RN: 11/17/1950 (71 y.o. [name] Primary Care Mindi Akerson: [name] Other Clinician: Referring Shaia Porath: Treating Deandrea Rion/Extender: Tommy Rainwater in Treatment: 45 Encounter Discharge Information Items Discharge Condition:  Stable Ambulatory Status: Wheelchair Discharge Destination: Home Transportation: Private Auto Accompanied By: husband Schedule Follow-up Appointment: Yes Clinical Summary of Care: Patient Declined Electronic Signature(s) Signed: 04/17/2021 5:58:46 PM By: Levan Hurst RN, BSN Entered By: Levan Hurst on 04/17/2021 17:07:18 -------------------------------------------------------------------------------- Lower Extremity Assessment Details Patient Name: Date of Service: [name] 04/17/2021 10:45 A M Medical Record Number: [id number] Patient Account Number: [id number] Date of Birth/Sex: Treating RN: [birth year] (71 y.o. [name] Primary Care Shamyah Stantz: [name] Other Clinician: Referring Hayley Horn: Treating Zoila Ditullio/Extender: Tommy Rainwater in Treatment: 119 Electronic Signature(s) Signed: 04/17/2021 5:26:08 PM By: Dellie Catholic RN Entered By: Dellie Catholic on 04/17/2021 11:00:01 -------------------------------------------------------------------------------- Multi Wound Chart Details Patient Name: Date of Service: [name], [name]. 04/17/2021 10:45 A M Medical Record Number: [id number] Patient Account Number: [id number] Date of Birth/Sex: Treating RN: 10-Mar-1951 (71 y.o. [name], [name] Primary Care Avaleigh Decuir: [name] Other Clinician: Referring Jermale Crass: Treating Emalynn Clewis/Extender: Tommy Rainwater in Treatment: 119 Vital Signs Height(in): 33 Pulse(bpm): 37 Weight(lbs): 185 Blood Pressure(mmHg): 112/71 Body Mass Index(BMI): 33 Temperature(F): 97.5 Respiratory Rate(breaths/min): 16 Photos: [N/A:N/A] Left Ischial Tuberosity Lateral Ischial Tuberosity N/A Wound Location: Pressure Injury Pressure Injury N/A Wounding Event: Pressure Ulcer Pressure Ulcer N/A Primary Etiology: Anemia, Hypertension, Type II Anemia, Hypertension, Type II N/A Comorbid History: Diabetes, Osteoarthritis, Dementia, Diabetes,  Osteoarthritis, Dementia, Paraplegia, Received Radiation Paraplegia, Received Radiation 09/06/2018 04/17/2021 N/A Date Acquired: 119 0 N/A Weeks of Treatment: Open Open N/A Wound Status: 2.6x1x0.6 1x0.3x0.1 N/A Measurements L x W x D (cm) 2.042 0.236 N/A A (cm) : rea 1.225 0.024 N/A Volume (cm) : -765.30% 0.00% N/A % Reduction in A rea: -477.80% 0.00% N/A % Reduction in Volume: 12 Starting Position 1 (o'clock): 12 Ending Position 1 (o'clock): 0.8 Maximum Distance 1 (cm): Yes  No N/A Undermining: Category/Stage IV Category/Stage II N/A Classification: Medium Medium N/A Exudate A mount: Serosanguineous Serosanguineous N/A Exudate Type: red, [name] red, [name] N/A Exudate Color: Well defined, not attached Distinct, outline attached N/A Wound Margin: Medium (34-66%) Large (67-100%) N/A Granulation A mount: Red, Pink, Friable Red N/A Granulation Quality: Medium (34-66%) None Present (0%) N/A Necrotic A mount: Fat Layer (Subcutaneous Tissue): Yes Fat Layer (Subcutaneous Tissue): Yes N/A Exposed Structures: Fascia: No Fascia: No Tendon: No Tendon: No Muscle: No Muscle: No Joint: No Joint: No Bone: No Bone: No Small (1-33%) None N/A Epithelialization: Treatment Notes Electronic Signature(s) Signed: 04/17/2021 4:30:17 PM By: Rhae Hammock RN Signed: 04/18/2021 7:40:11 AM By: Linton Ham MD Entered By: Linton Ham on 04/17/2021 12:27:09 -------------------------------------------------------------------------------- Multi-Disciplinary Care Plan Details Patient Name: Date of Service: [name], Molli Posey M. 04/17/2021 10:45 A M Medical Record Number: [id number] Patient Account Number: [id number] Date of Birth/Sex: Treating RN: 07-27-1950 (71 y.o. [name] Primary Care Sharion Grieves: Other Clinician: [name] Referring Marcene Laskowski: Treating Ashad Fawbush/Extender: Tommy Rainwater in Treatment: 119 Active Inactive Wound/Skin  Impairment Nursing Diagnoses: Knowledge deficit related to ulceration/compromised skin integrity Goals: Patient/caregiver will verbalize understanding of skin care regimen Date Initiated: 12/30/2018 Target Resolution Date: 05/17/2021 Goal Status: Active Interventions: Assess patient/caregiver ability to obtain necessary supplies Assess patient/caregiver ability to perform ulcer/skin care regimen upon admission and as needed Assess ulceration(s) every visit Provide education on ulcer and skin care Treatment Activities: Skin care regimen initiated : 12/30/2018 Topical wound management initiated : 12/30/2018 Notes: 01/08/21: Wound care regimen continues Electronic Signature(s) Signed: 04/17/2021 5:26:08 PM By: Dellie Catholic RN Entered By: Dellie Catholic on 04/17/2021 11:53:59 -------------------------------------------------------------------------------- Pain Assessment Details Patient Name: Date of Service: [name] 04/17/2021 10:45 A M Medical Record Number: [id number] Patient Account Number: [id number] Date of Birth/Sex: Treating RN: 1951-01-07 (71 y.o. [name] Primary Care Inayah Woodin: [name] Other Clinician: Referring Dreyton Roessner: Treating Hobie Kohles/Extender: Tommy Rainwater in Treatment: 119 Active Problems Location of Pain Severity and Description of Pain Patient Has Paino No Site Locations Pain Management and Medication Current Pain Management: Electronic Signature(s) Signed: 04/17/2021 5:26:08 PM By: Dellie Catholic RN Entered By: Dellie Catholic on 04/17/2021 10:59:52 -------------------------------------------------------------------------------- Patient/Caregiver Education Details Patient Name: Date of Service: [name] 1/11/2023andnbsp10:45 [place] Record Number: [id number] Patient Account Number: [id number] Date of Birth/Gender: Treating RN: 07-28-1950 (71 y.o. [name] Primary Care Physician: [name] Other Clinician: Referring Physician: Treating Physician/Extender: Tommy Rainwater in Treatment: 42 Education Assessment Education Provided To: Patient Education Topics Provided Wound/Skin Impairment: Handouts: Other: Physician spoke with pt. and spouse about other wound care therapies Methods: Explain/Verbal Responses: Return demonstration correctly Electronic Signature(s) Signed: 04/17/2021 5:26:08 PM By: Dellie Catholic RN Entered By: Dellie Catholic on 04/17/2021 11:44:26 -------------------------------------------------------------------------------- Wound Assessment Details Patient Name: Date of Service: [name] 04/17/2021 10:45 A M Medical Record Number: [id number] Patient Account Number: [id number] Date of Birth/Sex: Treating RN: 01/29/1951 (71 y.o. [name] Primary Care Hephzibah Strehle: [name] Other Clinician: Referring Elleanna Melling: Treating Makani Seckman/Extender: Tommy Rainwater in Treatment: 119 Wound Status Wound Number: 12 Primary Pressure Ulcer Etiology: Wound Location: Left Ischial Tuberosity Wound Open Wounding Event: Pressure Injury Status: Date Acquired: 09/06/2018 Comorbid Anemia, Hypertension, Type II Diabetes, Osteoarthritis, Weeks Of Treatment: 119 History: Dementia, Paraplegia, Received Radiation Clustered Wound: No Photos Wound Measurements Length: (cm) 2.6 Width: (cm) 1 Depth: (cm) 0.6 Area: (cm) 2.042 Volume: (cm)  1.225 % Reduction in Area: -765.3% % Reduction in Volume: -477.8% Epithelialization: Small (1-33%) Undermining: Yes Starting Position (o'clock): 12 Ending Position (o'clock): 12 Maximum Distance: (cm) 0.8 Wound Description Classification: Category/Stage IV Wound Margin: Well defined, not attached Exudate Amount: Medium Exudate Type: Serosanguineous Exudate Color: red, [name] Foul Odor After Cleansing: No Slough/Fibrino Yes Wound Bed Granulation Amount: Medium (34-66%)  Exposed Structure Granulation Quality: Red, Pink, Friable Fascia Exposed: No Necrotic Amount: Medium (34-66%) Fat Layer (Subcutaneous Tissue) Exposed: Yes Necrotic Quality: Adherent Slough Tendon Exposed: No Muscle Exposed: No Joint Exposed: No Bone Exposed: No Treatment Notes Wound #12 (Ischial Tuberosity) Wound Laterality: Left Cleanser Soap and Water Discharge Instruction: May shower and wash wound with dial antibacterial soap and water prior to dressing change. Wound Cleanser Discharge Instruction: Cleanse the wound with wound cleanser prior to applying a clean dressing using gauze sponges, not tissue or cotton balls. Peri-Wound Care Skin Prep Discharge Instruction: Use skin prep as directed Topical Keystone antibiotic compound Discharge Instruction: Apply antibiotic compound to wound bed, under silver alginate Primary Dressing KerraCel Ag Gelling Fiber Dressing, 2x2 in (silver alginate) Discharge Instruction: Apply silver alginate to wound bed as instructed Secondary Dressing ComfortFoam Border, 4x4 in (silicone border) Discharge Instruction: Apply over primary dressing as directed. Secured With Compression Wrap Compression Stockings Add-Ons Electronic Signature(s) Signed: 04/17/2021 5:26:08 PM By: Dellie Catholic RN Entered By: Dellie Catholic on 04/17/2021 11:14:17 -------------------------------------------------------------------------------- Wound Assessment Details Patient Name: Date of Service: [name] 04/17/2021 10:45 A M Medical Record Number: [id number] Patient Account Number: [id number] Date of Birth/Sex: Treating RN: 19-Apr-1950 (71 y.o. [name] Primary Care Cameo Schmiesing: [name] Other Clinician: Referring Tanicia Wolaver: Treating Tymia Streb/Extender: Tommy Rainwater in Treatment: 119 Wound Status Wound Number: 14 Primary Pressure Ulcer Etiology: Wound Location: Lateral Ischial Tuberosity Wound Open Wounding Event:  Pressure Injury Status: Date Acquired: 04/17/2021 Comorbid Anemia, Hypertension, Type II Diabetes, Osteoarthritis, Weeks Of Treatment: 0 History: Dementia, Paraplegia, Received Radiation Clustered Wound: No Photos Wound Measurements Length: (cm) 1 Width: (cm) 0.3 Depth: (cm) 0.1 Area: (cm) 0.236 Volume: (cm) 0.024 % Reduction in Area: 0% % Reduction in Volume: 0% Epithelialization: None Tunneling: No Undermining: No Wound Description Classification: Category/Stage II Wound Margin: Distinct, outline attached Exudate Amount: Medium Exudate Type: Serosanguineous Exudate Color: red, [name] Foul Odor After Cleansing: No Slough/Fibrino No Wound Bed Granulation Amount: Large (67-100%) Exposed Structure Granulation Quality: Red Fascia Exposed: No Necrotic Amount: None Present (0%) Fat Layer (Subcutaneous Tissue) Exposed: Yes Tendon Exposed: No Muscle Exposed: No Joint Exposed: No Bone Exposed: No Treatment Notes Wound #14 (Ischial Tuberosity) Wound Laterality: Lateral Cleanser Peri-Wound Care Topical Primary Dressing Secondary Dressing Secured With Compression Wrap Compression Stockings Add-Ons Electronic Signature(s) Signed: 04/17/2021 5:26:08 PM By: Dellie Catholic RN Entered By: Dellie Catholic on 04/17/2021 11:38:21 -------------------------------------------------------------------------------- Vitals Details Patient Name: Date of Service: [name] Pulse M. 04/17/2021 10:45 A M Medical Record Number: [id number] Patient Account Number: [id number] Date of Birth/Sex: Treating RN: [birth year] (71 y.o. [name] Primary Care Khristina Janota: [name] Other Clinician: Referring Niyam Bisping: Treating Hally Colella/Extender: Tommy Rainwater in Treatment: 119 Vital Signs Time Taken: 10:59 Temperature (F): 97.5 Height (in): 63 Pulse (bpm): 84 Weight (lbs): 185 Respiratory Rate (breaths/min): 16 Body Mass Index (BMI): 32.8 Blood Pressure (mmHg):  112/71 Reference Range: 80 - 120 mg / dl Electronic Signature(s) Signed: 04/17/2021 5:26:08 PM By: Dellie Catholic RN Entered By: Dellie Catholic on 04/17/2021 10:59:42

## 2021-04-18 NOTE — Progress Notes (Signed)
[name] ([id number]) Visit Report for 04/17/2021 HPI Details Patient Name: Date of Service: [name], [name] 04/17/2021 10:45 A M Medical Record Number: [id number] Patient Account Number: [id number] Date of Birth/Sex: Treating RN: Jul 01, 1950 (71 y.o. [name], [name] Primary Care Provider: [name] Other Clinician: Referring Provider: Treating Provider/Extender: Tommy Rainwater in Treatment: 119 History of Present Illness Location: open ulceration of the left gluteal area, left heel and right ankle for about 5 months. Quality: Patient reports No Pain. Severity: Patient states wound(s) are getting worse. Duration: Patient has had the wound for > 5 months prior to seeking treatment at the wound center Context: The wound occurred when the patient has been paraplegic for about 3 years. Modifying Factors: Wound improving due to current treatment. ssociated Signs and Symptoms: Patient reports having foul odor. A HPI Description: this 71 year old patient who is known to have hypertension, hypothyroidism, breast cancer, chronic pain syndrome, paraplegia was noted to have a left gluteal decubitus ulcer and was brought into the hospital. During the course of her hospitalization she was debrided in the operating room by ankle wound. Bone cultures were taken at that time but were negative but clinically she was treated for osteomyelitis because of the probing down to bone and open exposed bone. Home health has been giving her antibioticss which include vancomycin and Zosyn. The patient was a smoker until about 3 weeks ago and used to smoke about 10 cigarettes a day for a long while. 12/13/2014 - details of her operative note from 11/03/2014 were reviewed -- PROCEDURE: 1. Excisional debridement skin, subcutaneous, muscle left ischium 35 cm2 2. Excisional debridement skin, subcutaneous tissue left heel 27 cm2 3. Excisional debridement right ankle skin, subcutaneous, bone  30 cm2 01/24/2015 -- she has some issues with her wheelchair cushion but other than that is doing very well and has received Podus boots for her feet. 02/14/2015 -- she was using her old offloading boots and this seemed to have caused her a new pressure ulcer on the left posterior heel near the superior part just below the Achilles tendon. 03/07/2015 -- she has a new ulceration just to the left of the midline on her sacral region more on the left buttock and this has been there for Dr. Leland Johns and had all the wounds sharply debrided. The debridement was done for the left ischial wound, the left heel wound and the right about a week. 08/22/2015 -- was recently admitted to hospital between May 5 and 08/13/2015, with sepsis and leukocytosis due to a UTI. she was treated for a sepsis complicating Escherichia coli UTI and kidney stones. She also had metabolic and careful up at the secondary to pyelonephritis. He received broad-spectrum antibiotics initially and then received Macrobid as per urology. She was sent home on nitrofurantoin. during her admission she had a CT scan which showed exposed left ischial tuberosity without evidence of osteolysis. 09/12/2015-- the patient is having some issues with her air mattress and would like to get a opinion from medical modalities. 10/10/2015 -- the issue with her air mattress has not yet been sorted out and the new problem seems to be a lot of odor from the wound VAC. 11/27/2015 -- the patient was admitted to the hospital between July 23 and 10/31/2015. Her problems were sepsis, osteomyelitis of the pelvic bone and acute pyelonephritis. CT of the abdomen and pelvis was consistent with a left-sided pyelonephritis with hydronephrosis and also just showed new sclerosis of the posterior portion of  the left anterior pubic ramus suggestive of periosteal reaction consistent with osteomyelitis. She was treated for the osteomyelitis with infectious disease consult  recommending 6 weeks of IV antibiotics including vancomycin and Rocephin and the antibiotics were to go on until 12/10/2015. He was seen by Dr. Iran Planas plastic surgery and Dr. Linus Salmons of infectious disease. She had a suprapubic catheter placed during the admission. CT scan done on 10/28/2015 showed specifically -- New sclerosis of the posterior portion of the left inferior pubic ramus with aggressive periosteal reaction, consistent with osteomyelitis, with adjacent soft tissue gas compatible with previously described decubitus ulcer. 12/12/2015 -- she was recently seen by Dr. Linus Salmons, who noted good improvement and CRP and ESR compared to before and he has stopped her antibiotic as per plans to finish on September 4. The patient was encouraged to continue with wound care and consider hyperbaric oxygen therapy. Today she tells me that she has consented to undergo hyperbaric oxygen therapy and we can start the paperwork. 01/02/2016 -- her PCP had gained about 73 years but she still persists in having problems during hyperbaric oxygen therapy with some discomfort in the ears. 01/09/16; pressure area with underlying osteomyelitis in the left buttock. Wound bed itself has some slight amount of grayish surface slough however I do not think any debridement was necessary. There is no exposed bone soft tissue appears stable. She is using a wound VAC 01/16/16; back for weekly wound review in conjunction with HBO. She has a deep wound over the left initial tuberosity previously treated with 6 weeks of IV antibiotics for osteomyelitis. Wound bed looks reasonably healthy although the base of this is still precariously close to bone. She has been using a wound VAC. 01/23/2016 -- she has completed her course of antibiotics and this week the only new thing is her right great toe nail was avulsed and she has got an open wound over the nailbed. 01/31/16 she has completed her course of antibiotics. Her right great toenail  avulsed last week and she's been using silver alginate for this as well. Still using a wound VAC to the substantial stage IV wound over the left ischial tuberosity 03/05/2016 -- the patient has had a opinion from the plastic surgery group at [facility] and details of this are not available yet but the patient's verbal report has been heard by me. Did not sound like there was any optimistic discussion regarding reconstruction and the net result would be to continue with the wound VAC application. I will await the official reports. Addendum: -- she was seen at [facility] by Dr. Tressa Busman. After a thorough review and from what I understand spending 45 minutes with the patient his assessment has been noted by me in detail and the management options were: 1. Continued pressure offloading and wound care versus operative procedures including wound excision 2. Soft tissue and bone sampling 3. If the wound gets larger wound closure would be done using a variety of plastic surgical techniques including but not limited to skin substitute, possible skin graft, local versus regional flaps, negative pressure dressing application. 4. He discussed with her details of flap surgery and the risks associated 5. He made a comment that since the patient was operated on by Dr. Leland Johns of [facility] plastic surgery unit in [place] the patient may continue to follow-up there for further evaluation for surgical flap closure in the future. 03/19/2016 -- the patient continues to be rather depressed and frustrated  with her lack of rapid progress in healing this wound especially because she thought after hyperbaric oxygen therapy the wound would heal extremely fast. She now understands that was not the implied benefit on wound care which was the recommendation for hyperbaric oxygen therapy. I have had a lengthy discussion with the patient and her husband regarding her  options: 1. Continue with collagen and wound VAC for the primary dressing and offloading and all supportive care. 2. See Dr. Iran Planas for possible placement of Acell or Integra in the OR. 3. get a second opinion from a wound care center and surrounding regions/counties 05/07/2016 -- Note from Dr. Celedonio Miyamoto, who noted that the patient has declined flap surgery. She has discussed application of A cell, and try a few applications to see how the wound progresses. She is also recommended that we could apply products here in the wound center, like Oasis. during her preop workup it was found that her hemoglobin A1c was 11% and she has now been diagnosed as having diabetes mellitus and has been put on appropriate treatment by her PCP 05/28/2016 -- tells me her blood sugars have been doing well and she has an appointment to see her PCP in the next couple of weeks to check her hemoglobin A1c. Other than that she continues to do well. 06/25/2016 -- have not seen her back for the last month but she says her health has been about the same and she has an appointment to check the A1c next week 09/10/16 ---- was seen by Dr. Celedonio Miyamoto -- who applied Acell and saw her back in follow-up. She has recommended silver alginate to the wound every other day and cover with foam. If no significant drainage could transition to collagen every other day. She recommended discontinuing wound VAC. There were no plans to repeat application of Acell. The patient expressed that her husband could do the wound care as going to the Wound Ctr., would cost several $100 for each visit. 10/21/2016 -- her insurance company is getting her new mattress and she is pleased about that. Other than that she has been doing dressings with PolyMem Silver and has been doing very well 02/18/2017 -- she has gone through several changes of her mattress and has not been pleased with any of them. The ventricles are still working on trying to  fit her with the appropriate low air-loss mattress. She has a new wound on the gluteal area which is clearly separated from the original wound. 03/25/17-she is here in follow-up evaluation for her left ischialpressure ulcer. She remains unsatisfied with her pressure mattress. She admits to sitting multiple hours a day, in the bed. We have discussed offloading options. The wound does not appear infected. Nutrition does not appear to be a concern. Will follow-up in 4 weeks, if wound continues to be stalled may consider x-ray to evaluate for refractory osteomyelitis. 04/21/17; this is a patient that I don't know all that well. She has a chronic wound which at one point had underlying osteomyelitis in the left ischial tuberosity. This is a stage IV pressure ulcer. Over the last 3 months she has a stage II wound inferiorly to the original wound. The last time she was here her dressing was changed to silver collagen although the patient's husband who changes the dressing said that the collagen stuck to the wound and remove skin from the superficial area therefore he switched back to Manson 05/13/17; this is a patient we've been following for a left  ischial tuberosity wound which was stage IV at one point had underlying osteomyelitis. Over the last several months she's had a stage II wound just inferior and medial to the related to the wound. According to her husband he is using Endoform layer with collagen although this is not what I had last time. According to her husband they are using Elgie Congo with collagen although I don't quite know how that started. She was hospitalized from 1/20 through 04/30/16. This was related to a UTI. Her blood cultures were negative, urine culture showed multiple species. She did have a CT scan of the abdomen and pelvis which documented chronic osteomyelitis in the area of the wound inflammatory markers were unremarkable. She has had prior knowledge of osteomyelitis. It  looks as though she received IV antibiotics in 2017 and was treated with a course of hyperbaric oxygen. 05/28/17; the wound over the left ischial tuberosity is deeper today and abuts clearly on bone. Nursing intake reported drainage. I therefore culture of the wound. The more superficial area just below this looks about the same. They once again complained that there are mattress cover is not working although apparently [facility] is been noted to see this many times in the report is that the device is functional 06/18/17; the patient had a probing area on the left ischial tuberosity that was draining purulent fluid last time. This also clearly seemed to have open bone. Culture I did showed pansensitive pseudomonas including third generation cephalosporins. I treated this with cefdinir 300 twice a day for 10 days and things seem to have improved. She has a more superficial wound just underneath this area. Amazingly she has a new air mattress through [facility]. I think they gave this to her as a parking give. In any case this now works according to the patient may have something to do with why the areas are looking better. 07/09/17; the patient has a probing area in the left ischial tuberosity that still has some depth. However this is contracted in terms of the wound orifice although the depth is still roughly the same. There is no undermining. She also has the satellite wound which is more superficial. This appears to have a healthy surface we've been using silver collagen 08/06/17; the patient's wound is over the left ischial tuberosity and a satellite lesion just underneath this. The original wound was actually a deep stage 4 wound. We have made good progress in 2 months and there is no longer exposed bone here. 09/03/17; left ischial tuberosity actually appears to be quite healthy. I think we are making progress. No debridement is required. There is no surrounding erythema 10/01/17 I  follow this patient monthly for her left ischial tuberosity wound. There is 2 areas the original area and a satellite area. The satellite area looks a lot better there is no surrounding erythema. Her husband relates that he is having trouble maintaining the dressing. This has to do with the soft tissue around it. He states he puts the collagen in but he cannot make sure that it stays in even with the ABD pads and tape that he is been using 10/29/17; patient arrives with a better looking noon today. Some of the satellite lesions have closed. using Prisma 11/26/17; the patient has a large cone-shaped area with the tip of the Cone deep within her buttock soft tissue. The walls of the Cone are epithelialized however the base is still open. The area at the base of this looks  moist we've been using silver collagen. Will change to silver alginate 12/31/2017; the wound appears to have come in fairly nicely. Using silver alginate. There is no surrounding maceration or infection 01/28/18; there is still an open area here over the left initial tuberosity. Base of this however looks healthy. There is no surrounding infection 02/25/18; the area of its open is over the left ischial tuberosity. The base of this is where the wound is. This is a large inverted cone-shaped area with the wound at the tip. Dimensions of the wound at the tip are improved. There is a area of denuded skin about halfway towards the tip which her husband thinks may have happened today when he was bathing her. 04/20/17; the area is still open over the left initial tuberosity. 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 This is now completely closed over although it is closed over with a divot and skin over bone at the base of this. Continued aggressive  offloading will be necessary. 12/30/2018 READMISSION This is a 71 year old woman with chronic paraplegia. I picked her up for her care from Dr. Con Memos in this clinic after he departed. She had a stage IV pressure wound over the left ischial tuberosity. She was treated twice for her underlying osteomyelitis and this I believe firstly in 2016 and again in 2017. There were some plans at some point for flap closure of this however she was discovered to have uncontrolled diabetes and I do not think this was ever accomplished. She ultimately healed over in this clinic and was discharged in May. She has a large cone-shaped indentation with the tip of this going towards the left ischial tuberosity. It is not an easy area to examine but at that time I thought all of this was epithelialized. Apparently there was a reopening here shortly after she left the clinic last time. She was admitted to hospital at the end of June for Klebsiella bacteremia felt to be secondary to UTI. A CT scan of the pelvis is listed below and there was initially some concern that she had underlying osteomyelitis although I believe she was seen by infectious disease and that was felt to be not the case: I do not see any new cultures or inflammatory markers IMPRESSION: 1. No CT evidence for acute intra-abdominal or pelvic abnormality. Large volume of stool throughout the colon. 2. Enlarged fatty liver with fat sparing near the gallbladder fossa 3. Cortical scarring right kidney. Bilateral intrarenal stones without hydronephrosis. Thick-walled urinary bladder decompressed by suprapubic catheter 4. Deep left decubitus ulcer with underlying left ischial changes suggesting osteomyelitis. Her husband has been using silver collagen in the wound. She has not been systemically unwell no fever chills eating and drinking well. They rigorously offload this wound only getting up in the wheelchair when she is going to appointments the rest of the  time she is in bed. 10/8; wound measures larger and she now has exposed bone. We have been using silver alginate 11/12 still using silver alginate. The patient saw Dr. Megan Salon of infectious disease. She was started on Augmentin 500 mg twice daily. She is due to follow-up with Dr. Megan Salon I believe next week. Lab work Dr. Megan Salon requested showed a sedimentation rate of 28 and CRP of 20 although her CRP 1 year ago  was 18.8. Sedimentation rate 1 year ago was 11 basic metabolic panel showed a creatinine of 1.12 12/3; the patient followed up with Dr. Megan Salon yesterday. She is still on Augmentin twice daily. This was directed by Dr. Megan Salon. The patient's inflammatory markers have improved which is gratifying. Her C-reactive protein was repeated yesterday and follow-up booked with infectious disease in January. In addition I have been getting secure text messages I think from palliative care through [facility] [employer]. I think they were hoping to provide services to the patient in her home. They could not get a hold of the primary physician and so they reached out to me on 2 separate occasions. 12/17; patient last saw Dr. Megan Salon on 12/2. She is finishing up with Augmentin. Her C-reactive protein was 20 on 10/21, 10.1 on 11/19 and 17 on 12/2. The wound itself still has depth and undermining. We are using Santyl with the backing wet-to-dry 04/27/2019. The wound is gradually clearing up in terms of the surface although it is not filled in that much. Still abuts right against bone 2/4; patient with a deep pressure ulcer over the left ischial tuberosity. I thought she was going to follow-up with infectious disease to follow her inflammatory markers although the patient states that they stated that they did not need to see her unless we felt it was necessary. I will need to check their notes. In any case we ordered moistened silver collagen back with wet-to-dry to fill in the depth of  the wound although apparently [facility] sent silver alginate which they have been using since they were here the last time. Is obviously not what we ordered. 2/25. Not much change in this wound it is over the left ischial tuberosity recurrent wound. We have been using silver collagen with backing wet-to-dry. I think the wound is about the same. There is still some tunneling from about 10-12 o'clock over the ischial tuberosity itself 3/11; pressure ulcer over the left ischial tuberosity. Since she was last here the wound VAC was started and apparently going quite well. We are able to get the home health company that accepts [place] healthcare which is in itself sometimes problematic. There is been improvements in the wound the tunneling seems to be better and is contracted nicely 4/8; 1 month follow-up. Since she was last here we have been using silver collagen under a wound VAC. Some minor contraction I think in wound volume. She is cared for diligently by her husband including pressure relief, incontinence management, nutritional support etc. 6/1; this is almost a 33-monthfollow-up. She is been using silver collagen under wound VAC. Circular area over the left ischial tuberosity. She has been using silver collagen under wound VAC 7/8; 1 month follow-up. Silver collagen under the VAC not really a lot of progress. Tissue at the base of the wound which is right against bone and the tissue next that this does not look completely viable. She is not currently on any antibiotics, she had underlying osteomyelitis I need to look this over 8/16; we are using silver collagen under wound VAC to the left ischial tuberosity wound. Comes in today with absolutely no change in surface area or depth. There is no exposed bone. I did look over her infectious disease notes as I said I would do last time. She last saw Dr. CMegan Salonin December 2020. She completed 6 weeks of Augmentin. This was in response to a bone culture I  did showing methicillin susceptible staph aureus  and Enterococcus. She was supposed to come back to see Dr. Megan Salon at some point although they say that that appointment was canceled unless I chose to recommend return. I think there was supposed to be follow-up with inflammatory markers but I cannot see that that was ever done. She has not been on antibiotics since 9/21; monthly follow-up. We received a call from home health nurse last evening to report green drainage coming out of the wound. Lab work I ordered last time showed a white count of 5.2 a sedimentation rate of 45 and a C-reactive protein of 25 however neither one of the 2 values are substantially different from her previous values in October 2020 or December 2020. Both are slightly higher but only marginally. Otherwise no new complaints from the patient or her husband 10/19; 1 month follow-up. PCR culture I did last time showed medium quantities of Pseudomonas lower quantities Klebsiella and Enterococcus faecalis group B strep and Peptostreptococcus. I gave her Augmentin for 2 weeks. I am not really sure of my choice of this I would not cover Pseudomonas. She is still having green drainage. Wound itself looks satisfactory there is not a lot of depth wound bed looks healthy 11/16; patient has completed the antibiotics still using gentamicin and silver alginate on the wound. There is improvement in the surface area 12/21; in general the area on the buttock looks somewhat better. Surface looks healthy although I do not know that there is been much improvement in the wound volume. We have been using silver alginate and Hydrofera Blue. Less drainage. In passing the husband showed me an abrasion injury on the left anterior tibia. Covered in necrotic surface. He has noticed this for about a week and has been putting silver collagen on it. He is completely uncertain about how this happened 1/25; monthly follow-up. The area on the left buttock is  about the same. This does not go to bone but a fairly deep wound surface of the wound is of questionable viability. The abrasion injury that they showed me last time apparently was closed out by home health because they thought it was healed but certainly is not although it is just about healed. As a result they haven't been applying anything to this area Finally I did discuss with the patient and her husband the idea of an advanced treatment product to try and get a proper base to this wound I was thinking of Puraply however actually the patient points out that her co-pay for coming to visit [place] i.e. the facility, charge would be unaffordable if they have to, on a weekly basis 2/22; pressure ulcer on the left buttock appears deeper to me and abuts on the ischial tuberosity. I thought initially there was exposed bone but there is a rim of tissue over this area. She also has a superficial over the right anterior mid tibia. Been using silver collagen to these areas without much success. I have looked over the patient's past history with regards to the area on the left ischium. She did have underlying osteomyelitis here dating back I think to late 2020. She saw Dr. Megan Salon she received a 6-week course of oral antibiotics in response to a bone culture that I did. This does not appear to be infected but it certainly has not been improving in terms of granulation. I do not believe she has had any recent imaging studies 3/29; 1 month follow-up. Pressure ulcer on the left buttock which she has been dealing with with  for a number of years. She was treated for underlying osteomyelitis at 1.2 or 3 years ago I think with infectious disease help. She also had I think a flap closure by Dr. Leland Johns and that lasted for about a year and then reopened. I have not been able to get this patient to progress towards healing although truthfully the wound is absolutely no worse. We have been using Hydrofera Blue 4/26;  patient presents for 1 week follow-up. She has been using silver collagen to the area every other day. She has home health that comes out once a week to help with dressing changes as well. The patient is interested in trying a skin substitute over this area. She states she is trying to relieve pressure off of it most of the day. 5/24; patient presents for 1 month follow-up. She has been using silver collagen to the area every other day. She has no complaints today. She is interested in the skin substitute. She tries to leave relieve pressure off Her bottom however is not able to most of the day 6/14; using silver collagen to the area over the left ischial tuberosity. Wound does not appear to be doing particularly well. Open to bone 6/28; the patient patient presented last time with a marked deterioration. Depth probing all the way to bone. The bone itself did not look particularly viable. In spite of this the x-ray I did showed longstanding ulcer over the left ischial tuberosity with chronic bone involvement/reaction that was also seen by CT in 2020. Lab work did not show just active infection with a sed rate of 14 and a C-reactive protein of 7.1. Her comprehensive metabolic panel was normal including an albumin of 4.1 white count was 6 7/19; patient was here 3 weeks ago with a marked deterioration in her wound over the left ischial tuberosity. I ordered a CT scan of the area for 1 reason or another this just did not get done. It is now booked for 8 days from now. She was supposed to come back for bone culture and pathology. That did not happen either. We have been using silver collagen. As usual she is diligently looked after by her husband 8/24; 5-week follow-up. Since the patient was last here the biopsy that I did of her ischial tuberosity came back suggesting osteomyelitis. Culture showed strep. I started her on Augmentin. She was seen by infectious disease Dr. Lucianne Lei dam and the Augmentin was  continued and she is still taking it. CT scan did not suggest anything other than chronic osteomyelitis without much change from her previous study. Her C-reactive protein was only 7 and sedimentation rate was 1.1. We are still using silver collagen to the wound. She has home health. Her husband is very diligent in her care for this reason I have never pursued a diverting colostomy. She would not agree to this surgery in any case. Finally we have discussed plastic surgery with him in the past and she is not interested in a myocutaneous flap. 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 Since she was last here she was in the ER last week with abdominal pain. She was found to be impacted however in the course of the review there somebody gave her some IV morphine and apparently she developed hives and blisters. There is still tense blisters on her plantar left fifth and fourth fingers 10/4; the patient has completed her Augmentin and is due to follow-up with Dr. Drucilla Schmidt in early November. Her wound  today measures about the same but looks a little healthier in terms of granulation there is no exposed bone. I note that she was admitted to hospital for 2 days from 9/21 through 9/23 for delirium. She had received Versed for glaucoma surgery ultimately that was felt to be the etiology. Her blood cultures were negative she had 30,000 colonies of Pseudomonas in her urine. She did receive broad-spectrum antibiotic therapy but ultimately her wound on the buttock was not felt to be the cause. As mentioned she has completed her antibiotics still using silver collagen 11/1; patient has completed her antibiotics for the underlying osteomyelitis. She apparently follows up with Dr. Drucilla Schmidt next week. The wound does not look too bad perhaps slightly narrower in terms of width but the depth is about the same. We have been using Hydrofera Blue. The patient talks to me about a wound VAC and made it sound as though she was  recently on 1 although I do not see this. The other option is an advanced treatment product like Oasis but that means weekly trips into the clinic. She has previously said she does not want an attempt at plastic surgery 11/15; the patient saw Dr. Drucilla Schmidt noted that her follow-up inflammatory markers normalized. She has completed her antibiotics. We are currently using Hydrofera Blue. Wound itself has some surface tissue over bone but certainly not a lot. I have looked back over her history. The patient has had recurrent osteomyelitis in this area as she is received prolonged courses of antibiotics. We did use a wound VAC for a prolonged period of time in 2021. We had some improvement especially in undermining areas but overall not a lot of measurable improvement. Once again I have tried to think about using advanced treatment products in this area something that would require them visiting the clinic very frequently and they did not seem to want to do this. Most recently we ran [place] however she would have a $427 per application co-pay 06/2; left ischial tuberosity. I do not see much difference in 3 weeks. There is no exposed bone. We are using Hydrofera Blue. Our intake nurse reports some "greenish" drainage 12/21 left ischial tuberosity. Measuring slightly smaller in surface area. The wound still has some tissue over bone i.e. there is no exposed bone. No overt infection. We did a deep tissue culture last time for PCR. The major bacteria was Pseudomonas although there were medium titers of Enterococcus faecalis Klebsiella E. coli and Morganella as well as Peptostreptococcus. Keystone antibiotic include streptomycin and vancomycin they got this last weekend and used it twice 04/17/2021; no change in this wound. It does not have undermining however the surface is of it does not look particularly viable. We have been using topical antibiotic directed at a PCR culture as well as silver alginate. It is  clear in talking to the patient and her husband that she does not offload this area adequately including spending all night on this with I think a level 2 surface on her bed. I have told her that this is not adequate to heal a wound like this.. Electronic Signature(s) Signed: 04/18/2021 7:40:11 AM By: Linton Ham MD Entered By: Linton Ham on 04/17/2021 12:32:25 -------------------------------------------------------------------------------- Physical Exam Details Patient Name: Date of Service: [name]. 04/17/2021 10:45 A M Medical Record Number: [id number] Patient Account Number: [id number] Date of Birth/Sex: Treating RN: 08/22/1950 (71 y.o. [name], [name] Primary Care Provider: [name] Other Clinician: Referring Provider: Treating Provider/Extender: Linton Ham  [name] Weeks in Treatment: 119 Constitutional Sitting or standing Blood Pressure is within target range for patient.. Pulse regular and within target range for patient.Marland Kitchen Respirations regular, non-labored and within target range.. Temperature is normal and within the target range for the patient.Marland Kitchen Appears in no distress. Notes Wound exam; unfortunately not much improvement here. The wound itself has a very thin rim of tissue over the ischial tuberosity. The depth is the same here. The surface of the granulation does not look particularly good. No obvious surrounding infection. The wound itself is at the bottom of a large cone-shaped area secondary to previously healed ulcers in this area Electronic Signature(s) Signed: 04/18/2021 7:40:11 AM By: Linton Ham MD Entered By: Linton Ham on 04/17/2021 12:34:47 -------------------------------------------------------------------------------- Physician Orders Details Patient Name: Date of Service: [name]. 04/17/2021 10:45 A M Medical Record Number: [id number] Patient Account Number: [id number] Date of Birth/Sex: Treating RN: [birth year]  (71 y.o. 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 - Dr. Dellia Nims Bathing/ Shower/ Hygiene May shower and wash wound with soap and water. - with dressing changes Edema Control - Lymphedema / SCD / Other Elevate legs to the level of the heart or above for 30 minutes daily and/or when sitting, a frequency of: - throughout the day Off-Loading Turn and reposition every 2 hours - *** Try to shift from side to side and shift to belly (if tolerated)**** Home Health No change in wound care orders this week; continue Home Health for wound care. May utilize formulary equivalent dressing for wound treatment orders unless otherwise specified. Other Home Health Orders/Instructions: - [facility] Wound Treatment Wound #12 - Ischial Tuberosity Wound Laterality: Left Cleanser: Soap and Water (Home Health) 1 x Per Day/30 Days Discharge Instructions: May shower and wash wound with dial antibacterial soap and water prior to dressing change. Cleanser: Wound Cleanser (Home Health) 1 x Per Day/30 Days Discharge Instructions: Cleanse the wound with wound cleanser prior to applying a clean dressing using gauze sponges, not tissue or cotton balls. Peri-Wound Care: Skin Prep (Home Health) 1 x Per Day/30 Days Discharge Instructions: Use skin prep as directed Topical: Keystone antibiotic compound 1 x Per Day/30 Days Discharge Instructions: Apply antibiotic compound to wound bed, under silver alginate Prim Dressing: KerraCel Ag Gelling Fiber Dressing, 2x2 in (silver alginate) (Home Health) 1 x Per Day/30 Days ary Discharge Instructions: Apply silver alginate to  wound bed as instructed Secondary Dressing: ComfortFoam Border, 4x4 in (silicone border) ([place]) 1 x Per Day/30 Days Discharge Instructions: Apply over primary dressing as directed. Electronic Signature(s) Signed: 04/17/2021 5:26:08 PM By: Dellie Catholic RN Signed: 04/18/2021 7:40:11 AM By: Linton Ham MD Entered By: Dellie Catholic on 04/17/2021 11:53:37 -------------------------------------------------------------------------------- Problem List Details Patient Name: Date of Service: [name], [name]. 04/17/2021 10:45 A M Medical Record Number: [id number] Patient Account Number: [id number] Date of Birth/Sex: Treating RN: 06-Nov-1950 (71 y.o. 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, stage 4 12/30/2018 No Yes E11.622 Type 2 diabetes mellitus with other skin ulcer 12/30/2018  No Yes Inactive Problems ICD-10 Code Description Active Date Inactive Date G82.20 Paraplegia, unspecified 12/30/2018 12/30/2018 M86.68 Other chronic osteomyelitis, other site 02/17/2019 02/17/2019 S80.811D Abrasion, right lower leg, subsequent encounter 03/27/2020 03/27/2020 M86.68 Other chronic osteomyelitis, other site 11/28/2020 11/28/2020 L97.811 Non-pressure chronic ulcer of other part of right lower leg limited to breakdown of skin 03/27/2020 03/27/2020 Resolved Problems Electronic Signature(s) Signed: 04/18/2021 7:40:11 AM By: Linton Ham MD Entered By: Linton Ham on 04/17/2021 12:26:56 -------------------------------------------------------------------------------- Progress Note Details Patient Name: Date of Service: [name]. 04/17/2021 10:45 A M Medical Record Number: [id number] Patient Account Number: [id number] Date of  Birth/Sex: Treating RN: 14-Feb-1951 (71 y.o. [name], [name] Primary Care Provider: [name] Other Clinician: Referring Provider: Treating Provider/Extender: Tommy Rainwater in Treatment: 119 Subjective History of Present Illness (HPI) The following HPI elements were documented for the patient's wound: Location: open ulceration of the left gluteal area, left heel and right ankle for about 5 months. Quality: Patient reports No Pain. Severity: Patient states wound(s) are getting worse. Duration: Patient has had the wound for > 5 months prior to seeking treatment at the wound center Context: The wound occurred when the patient has been paraplegic for about 3 years. Modifying Factors: Wound improving due to current treatment. Associated Signs and Symptoms: Patient reports having foul odor. this 71 year old patient who is known to have hypertension, hypothyroidism, breast cancer, chronic pain syndrome, paraplegia was noted to have a left gluteal decubitus ulcer and was brought into the hospital. During the course of her hospitalization she was debrided in the operating room by ankle wound. Bone cultures were taken at that time but were negative but clinically she was treated for osteomyelitis because of the probing down to bone and open exposed bone. Home health has been giving her antibioticss which include vancomycin and Zosyn. The patient was a smoker until about 3 weeks ago and used to smoke about 10 cigarettes a day for a long while. 12/13/2014 - details of her operative note from 11/03/2014 were reviewed -- PROCEDURE: 1. Excisional debridement skin, subcutaneous, muscle left ischium 35 cm2 2. Excisional debridement skin, subcutaneous tissue left heel 27 cm2 3. Excisional debridement right ankle skin, subcutaneous, bone 30 cm2 01/24/2015 -- she has some issues with her wheelchair cushion but other than that is doing very well and has received Podus boots for her  feet. 02/14/2015 -- she was using her old offloading boots and this seemed to have caused her a new pressure ulcer on the left posterior heel near the superior part just below the Achilles tendon. 03/07/2015 -- she has a new ulceration just to the left of the midline on her sacral region more on the left buttock and this has been there for Dr. Leland Johns and had all the wounds sharply debrided. The debridement was done for the left ischial wound, the left heel wound and the right about a week. 08/22/2015 -- was recently admitted to hospital between May 5 and 08/13/2015, with sepsis and leukocytosis due to a UTI. she was treated for a sepsis complicating Escherichia coli UTI and kidney stones. She also had metabolic and careful up at the secondary to pyelonephritis. He received broad-spectrum antibiotics initially and then received Macrobid as per urology. She was sent home on nitrofurantoin. during her admission she had a CT scan which showed exposed left ischial tuberosity without evidence of osteolysis. 09/12/2015-- the patient is having some issues with her air mattress and would like to get a opinion from  medical modalities. 10/10/2015 -- the issue with her air mattress has not yet been sorted out and the new problem seems to be a lot of odor from the wound VAC. 11/27/2015 -- the patient was admitted to the hospital between July 23 and 10/31/2015. Her problems were sepsis, osteomyelitis of the pelvic bone and acute pyelonephritis. CT of the abdomen and pelvis was consistent with a left-sided pyelonephritis with hydronephrosis and also just showed new sclerosis of the posterior portion of the left anterior pubic ramus suggestive of periosteal reaction consistent with osteomyelitis. She was treated for the osteomyelitis with infectious disease consult recommending 6 weeks of IV antibiotics including vancomycin and Rocephin and the antibiotics were to go on until 12/10/2015. He was seen by Dr.  Iran Planas plastic surgery and Dr. Linus Salmons of infectious disease. She had a suprapubic catheter placed during the admission. CT scan done on 10/28/2015 showed specifically -- New sclerosis of the posterior portion of the left inferior pubic ramus with aggressive periosteal reaction, consistent with osteomyelitis, with adjacent soft tissue gas compatible with previously described decubitus ulcer. 12/12/2015 -- she was recently seen by Dr. Linus Salmons, who noted good improvement and CRP and ESR compared to before and he has stopped her antibiotic as per plans to finish on September 4. The patient was encouraged to continue with wound care and consider hyperbaric oxygen therapy. Today she tells me that she has consented to undergo hyperbaric oxygen therapy and we can start the paperwork. 01/02/2016 -- her PCP had gained about 73 years but she still persists in having problems during hyperbaric oxygen therapy with some discomfort in the ears. 01/09/16; pressure area with underlying osteomyelitis in the left buttock. Wound bed itself has some slight amount of grayish surface slough however I do not think any debridement was necessary. There is no exposed bone soft tissue appears stable. She is using a wound VAC 01/16/16; back for weekly wound review in conjunction with HBO. She has a deep wound over the left initial tuberosity previously treated with 6 weeks of IV antibiotics for osteomyelitis. Wound bed looks reasonably healthy although the base of this is still precariously close to bone. She has been using a wound VAC. 01/23/2016 -- she has completed her course of antibiotics and this week the only new thing is her right great toe nail was avulsed and she has got an open wound over the nailbed. 01/31/16 she has completed her course of antibiotics. Her right great toenail avulsed last week and she's been using silver alginate for this as well. Still using a wound VAC to the substantial stage IV wound over the left  ischial tuberosity 03/05/2016 -- the patient has had a opinion from the plastic surgery group at [facility] and details of this are not available yet but the patient's verbal report has been heard by me. Did not sound like there was any optimistic discussion regarding reconstruction and the net result would be to continue with the wound VAC application. I will await the official reports. Addendum: -- she was seen at [place] surgery service by Dr. Tressa Busman. After a thorough review and from what I understand spending 45 minutes with the patient his assessment has been noted by me in detail and the management options were: 1. Continued pressure offloading and wound care versus operative procedures including wound excision 2. Soft tissue and bone sampling 3. If the wound gets larger wound closure would be done using a variety of plastic surgical techniques including  but not limited to skin substitute, possible skin graft, local versus regional flaps, negative pressure dressing application. 4. He discussed with her details of flap surgery and the risks associated 5. He made a comment that since the patient was operated on by Dr. Leland Johns of [facility] plastic surgery unit in [place] the patient may continue to follow-up there for further evaluation for surgical flap closure in the future. 03/19/2016 -- the patient continues to be rather depressed and frustrated with her lack of rapid progress in healing this wound especially because she thought after hyperbaric oxygen therapy the wound would heal extremely fast. She now understands that was not the implied benefit on wound care which was the recommendation for hyperbaric oxygen therapy. I have had a lengthy discussion with the patient and her husband regarding her options: 1. Continue with collagen and wound VAC for the primary dressing and offloading and all supportive care. 2. See Dr. Iran Planas for possible  placement of Acell or Integra in the OR. 3. get a second opinion from a wound care center and surrounding regions/counties 05/07/2016 -- Note from Dr. Celedonio Miyamoto, who noted that the patient has declined flap surgery. She has discussed application of A cell, and try a few applications to see how the wound progresses. She is also recommended that we could apply products here in the wound center, like Oasis. during her preop workup it was found that her hemoglobin A1c was 11% and she has now been diagnosed as having diabetes mellitus and has been put on appropriate treatment by her PCP 05/28/2016 -- tells me her blood sugars have been doing well and she has an appointment to see her PCP in the next couple of weeks to check her hemoglobin A1c. Other than that she continues to do well. 06/25/2016 -- have not seen her back for the last month but she says her health has been about the same and she has an appointment to check the A1c next week 09/10/16 ---- was seen by Dr. Celedonio Miyamoto -- who applied Acell and saw her back in follow-up. She has recommended silver alginate to the wound every other day and cover with foam. If no significant drainage could transition to collagen every other day. She recommended discontinuing wound VAC. There were no plans to repeat application of Acell. The patient expressed that her husband could do the wound care as going to the Wound Ctr., would cost several $100 for each visit. 10/21/2016 -- her insurance company is getting her new mattress and she is pleased about that. Other than that she has been doing dressings with PolyMem Silver and has been doing very well 02/18/2017 -- she has gone through several changes of her mattress and has not been pleased with any of them. The ventricles are still working on trying to fit her with the appropriate low air-loss mattress. She has a new wound on the gluteal area which is clearly separated from the original  wound. 03/25/17-she is here in follow-up evaluation for her left ischialpressure ulcer. She remains unsatisfied with her pressure mattress. She admits to sitting multiple hours a day, in the bed. We have discussed offloading options. The wound does not appear infected. Nutrition does not appear to be a concern. Will follow-up in 4 weeks, if wound continues to be stalled may consider x-ray to evaluate for refractory osteomyelitis. 04/21/17; this is a patient that I don't know all that well. She has a chronic wound which at one point had underlying osteomyelitis  in the left ischial tuberosity. This is a stage IV pressure ulcer. Over the last 3 months she has a stage II wound inferiorly to the original wound. The last time she was here her dressing was changed to silver collagen although the patient's husband who changes the dressing said that the collagen stuck to the wound and remove skin from the superficial area therefore he switched back to [place] 05/13/17; this is a patient we've been following for a left ischial tuberosity wound which was stage IV at one point had underlying osteomyelitis. Over the last several months she's had a stage II wound just inferior and medial to the related to the wound. According to her husband he is using Endoform layer with collagen although this is not what I had last time. According to her husband they are using Elgie [place] with collagen although I don't quite know how that started. She was hospitalized from 1/20 through 04/30/16. This was related to a UTI. Her blood cultures were negative, urine culture showed multiple species. She did have a CT scan of the abdomen and pelvis which documented chronic osteomyelitis in the area of the wound inflammatory markers were unremarkable. She has had prior knowledge of osteomyelitis. It looks as though she received IV antibiotics in 2017 and was treated with a course of hyperbaric oxygen. 05/28/17; the wound over the left  ischial tuberosity is deeper today and abuts clearly on bone. Nursing intake reported drainage. I therefore culture of the wound. The more superficial area just below this looks about the same. They once again complained that there are mattress cover is not working although apparently [facility] is been noted to see this many times in the report is that the device is functional 06/18/17; the patient had a probing area on the left ischial tuberosity that was draining purulent fluid last time. This also clearly seemed to have open bone. Culture I did showed pansensitive pseudomonas including third generation cephalosporins. I treated this with cefdinir 300 twice a day for 10 days and things seem to have improved. She has a more superficial wound just underneath this area. Amazingly she has a new air mattress through [facility]. I think they gave this to her as a parking give. In any case this now works according to the patient may have something to do with why the areas are looking better. 07/09/17; the patient has a probing area in the left ischial tuberosity that still has some depth. However this is contracted in terms of the wound orifice although the depth is still roughly the same. There is no undermining. ooShe also has the satellite wound which is more superficial. This appears to have a healthy surface we've been using silver collagen 08/06/17; the patient's wound is over the left ischial tuberosity and a satellite lesion just underneath this. The original wound was actually a deep stage 4 wound. We have made good progress in 2 months and there is no longer exposed bone here. 09/03/17; left ischial tuberosity actually appears to be quite healthy. I think we are making progress. No debridement is required. There is no surrounding erythema 10/01/17 I follow this patient monthly for her left ischial tuberosity wound. There is 2 areas the original area and a satellite area. The satellite  area looks a lot better there is no surrounding erythema. Her husband relates that he is having trouble maintaining the dressing. This has to do with the soft tissue around it. He states he puts the  collagen in but he cannot make sure that it stays in even with the ABD pads and tape that he is been using 10/29/17; patient arrives with a better looking noon today. Some of the satellite lesions have closed. using Prisma 11/26/17; the patient has a large cone-shaped area with the tip of the Cone deep within her buttock soft tissue. The walls of the Cone are epithelialized however the base is still open. The area at the base of this looks moist we've been using silver collagen. Will change to silver alginate 12/31/2017; the wound appears to have come in fairly nicely. Using silver alginate. There is no surrounding maceration or infection 01/28/18; there is still an open area here over the left initial tuberosity. Base of this however looks healthy. There is no surrounding infection 02/25/18; the area of its open is over the left ischial tuberosity. The base of this is where the wound is. This is a large inverted cone-shaped area with the wound at the tip. Dimensions of the wound at the tip are improved. There is a area of denuded skin about halfway towards the tip which her husband thinks may have happened today when he was bathing her. 04/20/17; the area is still open over the left initial tuberosity. 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 This is now completely closed over although it is closed over with a divot and skin over bone at the base of this. Continued aggressive offloading will be necessary. 12/30/2018 READMISSION This is a 71 year old woman with chronic paraplegia. I picked her up for her care from  Dr. Con Memos in this clinic after he departed. She had a stage IV pressure wound over the left ischial tuberosity. She was treated twice for her underlying osteomyelitis and this I believe firstly in 2016 and again in 2017. There were some plans at some point for flap closure of this however she was discovered to have uncontrolled diabetes and I do not think this was ever accomplished. She ultimately healed over in this clinic and was discharged in May. She has a large cone-shaped indentation with the tip of this going towards the left ischial tuberosity. It is not an easy area to examine but at that time I thought all of this was epithelialized. Apparently there was a reopening here shortly after she left the clinic last time. She was admitted to hospital at the end of June for Klebsiella bacteremia felt to be secondary to UTI. A CT scan of the pelvis is listed below and there was initially some concern that she had underlying osteomyelitis although I believe she was seen by infectious disease and that was felt to be not the case: I do not see any new cultures or inflammatory markers IMPRESSION: 1. No CT evidence for acute intra-abdominal or pelvic abnormality. Large volume of stool throughout the colon. 2. Enlarged fatty liver with fat sparing near the gallbladder fossa 3. Cortical scarring right kidney. Bilateral intrarenal stones without hydronephrosis. Thick-walled urinary bladder decompressed by suprapubic catheter 4. Deep left decubitus ulcer with underlying left ischial changes suggesting osteomyelitis. Her husband has been using silver collagen in the wound. She has not been systemically unwell no fever chills eating and drinking well. They rigorously offload this wound only getting up in the wheelchair when she is going to  appointments the rest of the time she is in bed. 10/8; wound measures larger and she now has exposed bone. We have been using silver alginate 11/12 still using silver  alginate. The patient saw Dr. Megan Salon of infectious disease. She was started on Augmentin 500 mg twice daily. She is due to follow-up with Dr. Megan Salon I believe next week. Lab work Dr. Megan Salon requested showed a sedimentation rate of 28 and CRP of 20 although her CRP 1 year ago was 18.8. Sedimentation rate 1 year ago was 11 basic metabolic panel showed a creatinine of 1.12 12/3; the patient followed up with Dr. Megan Salon yesterday. She is still on Augmentin twice daily. This was directed by Dr. Megan Salon. The patient's inflammatory markers have improved which is gratifying. Her C-reactive protein was repeated yesterday and follow-up booked with infectious disease in January. In addition I have been getting secure text messages I think from palliative care through [facility] [employer]. I think they were hoping to provide services to the patient in her home. They could not get a hold of the primary physician and so they reached out to me on 2 separate occasions. 12/17; patient last saw Dr. Megan Salon on 12/2. She is finishing up with Augmentin. Her C-reactive protein was 20 on 10/21, 10.1 on 11/19 and 17 on 12/2. The wound itself still has depth and undermining. We are using Santyl with the backing wet-to-dry 04/27/2019. The wound is gradually clearing up in terms of the surface although it is not filled in that much. Still abuts right against bone 2/4; patient with a deep pressure ulcer over the left ischial tuberosity. I thought she was going to follow-up with infectious disease to follow her inflammatory markers although the patient states that they stated that they did not need to see her unless we felt it was necessary. I will need to check their notes. In any case we ordered moistened silver collagen back with wet-to-dry to fill in the depth of the wound although apparently [facility] sent silver alginate which they have been using since they were here the last time. Is obviously not  what we ordered. 2/25. Not much change in this wound it is over the left ischial tuberosity recurrent wound. We have been using silver collagen with backing wet-to-dry. I think the wound is about the same. There is still some tunneling from about 10-12 o'clock over the ischial tuberosity itself 3/11; pressure ulcer over the left ischial tuberosity. Since she was last here the wound VAC was started and apparently going quite well. We are able to get the home health company that accepts [place] healthcare which is in itself sometimes problematic. There is been improvements in the wound the tunneling seems to be better and is contracted nicely 4/8; 1 month follow-up. Since she was last here we have been using silver collagen under a wound VAC. Some minor contraction I think in wound volume. She is cared for diligently by her husband including pressure relief, incontinence management, nutritional support etc. 6/1; this is almost a 94-month follow-up. She is been using silver collagen under wound VAC. Circular area over the left ischial tuberosity. She has been using silver collagen under wound VAC 7/8; 1 month follow-up. Silver collagen under the VAC not really a lot of progress. Tissue at the base of the wound which is right against bone and the tissue next that this does not look completely viable. She is not currently on any antibiotics, she had underlying osteomyelitis I need  to look this over 8/16; we are using silver collagen under wound VAC to the left ischial tuberosity wound. Comes in today with absolutely no change in surface area or depth. There is no exposed bone. I did look over her infectious disease notes as I said I would do last time. She last saw Dr. Megan Salon in December 2020. She completed 6 weeks of Augmentin. This was in response to a bone culture I did showing methicillin susceptible staph aureus and Enterococcus. She was supposed to come back to see Dr. Megan Salon at some point although  they say that that appointment was canceled unless I chose to recommend return. I think there was supposed to be follow-up with inflammatory markers but I cannot see that that was ever done. She has not been on antibiotics since 9/21; monthly follow-up. We received a call from home health nurse last evening to report green drainage coming out of the wound. Lab work I ordered last time showed a white count of 5.2 a sedimentation rate of 45 and a C-reactive protein of 25 however neither one of the 2 values are substantially different from her previous values in October 2020 or December 2020. Both are slightly higher but only marginally. Otherwise no new complaints from the patient or her husband 10/19; 1 month follow-up. PCR culture I did last time showed medium quantities of Pseudomonas lower quantities Klebsiella and Enterococcus faecalis group B strep and Peptostreptococcus. I gave her Augmentin for 2 weeks. I am not really sure of my choice of this I would not cover Pseudomonas. She is still having green drainage. Wound itself looks satisfactory there is not a lot of depth wound bed looks healthy 11/16; patient has completed the antibiotics still using gentamicin and silver alginate on the wound. There is improvement in the surface area 12/21; in general the area on the buttock looks somewhat better. Surface looks healthy although I do not know that there is been much improvement in the wound volume. We have been using silver alginate and Hydrofera Blue. Less drainage. In passing the husband showed me an abrasion injury on the left anterior tibia. Covered in necrotic surface. He has noticed this for about a week and has been putting silver collagen on it. He is completely uncertain about how this happened 1/25; monthly follow-up. The area on the left buttock is about the same. This does not go to bone but a fairly deep wound surface of the wound is of questionable viability. The abrasion injury  that they showed me last time apparently was closed out by home health because they thought it was healed but certainly is not although it is just about healed. As a result they haven't been applying anything to this area Finally I did discuss with the patient and her husband the idea of an advanced treatment product to try and get a proper base to this wound I was thinking of Puraply however actually the patient points out that her co-pay for coming to visit [place] i.e. the facility, charge would be unaffordable if they have to, on a weekly basis 2/22; pressure ulcer on the left buttock appears deeper to me and abuts on the ischial tuberosity. I thought initially there was exposed bone but there is a rim of tissue over this area. She also has a superficial over the right anterior mid tibia. Been using silver collagen to these areas without much success. I have looked over the patient's past history with regards to the area on the  left ischium. She did have underlying osteomyelitis here dating back I think to late 2020. She saw Dr. Megan Salon she received a 6-week course of oral antibiotics in response to a bone culture that I did. This does not appear to be infected but it certainly has not been improving in terms of granulation. I do not believe she has had any recent imaging studies 3/29; 1 month follow-up. Pressure ulcer on the left buttock which she has been dealing with with for a number of years. She was treated for underlying osteomyelitis at 1.2 or 3 years ago I think with infectious disease help. She also had I think a flap closure by Dr. Leland Johns and that lasted for about a year and then reopened. I have not been able to get this patient to progress towards healing although truthfully the wound is absolutely no worse. We have been using Hydrofera Blue 4/26; patient presents for 1 week follow-up. She has been using silver collagen to the area every other day. She has home health that comes out once  a week to help with dressing changes as well. The patient is interested in trying a skin substitute over this area. She states she is trying to relieve pressure off of it most of the day. 5/24; patient presents for 1 month follow-up. She has been using silver collagen to the area every other day. She has no complaints today. She is interested in the skin substitute. She tries to leave relieve pressure off Her bottom however is not able to most of the day 6/14; using silver collagen to the area over the left ischial tuberosity. Wound does not appear to be doing particularly well. Open to bone 6/28; the patient patient presented last time with a marked deterioration. Depth probing all the way to bone. The bone itself did not look particularly viable. In spite of this the x-ray I did showed longstanding ulcer over the left ischial tuberosity with chronic bone involvement/reaction that was also seen by CT in 2020. Lab work did not show just active infection with a sed rate of 14 and a C-reactive protein of 7.1. Her comprehensive metabolic panel was normal including an albumin of 4.1 white count was 6 7/19; patient was here 3 weeks ago with a marked deterioration in her wound over the left ischial tuberosity. I ordered a CT scan of the area for 1 reason or another this just did not get done. It is now booked for 8 days from now. She was supposed to come back for bone culture and pathology. That did not happen either. We have been using silver collagen. As usual she is diligently looked after by her husband 8/24; 5-week follow-up. Since the patient was last here the biopsy that I did of her ischial tuberosity came back suggesting osteomyelitis. Culture showed strep. I started her on Augmentin. She was seen by infectious disease Dr. Lucianne Lei dam and the Augmentin was continued and she is still taking it. CT scan did not suggest anything other than chronic osteomyelitis without much change from her previous study.  Her C-reactive protein was only 7 and sedimentation rate was 1.1. We are still using silver collagen to the wound. She has home health. Her husband is very diligent in her care for this reason I have never pursued a diverting colostomy. She would not agree to this surgery in any case. Finally we have discussed plastic surgery with him in the past and she is not interested in a myocutaneous flap. 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 Since she was last here she was in the ER last week with abdominal pain. She was found to be impacted however in the course of the review there somebody gave her some IV morphine and apparently she developed hives and blisters. There is still tense blisters on her plantar left fifth and fourth fingers 10/4; the patient has completed her Augmentin and is due to follow-up with Dr. Drucilla Schmidt in early November. Her wound today measures about the same but looks a little healthier in terms of granulation there is no exposed bone. I note that she was admitted to hospital for 2 days from 9/21 through 9/23 for delirium. She had received Versed for glaucoma surgery ultimately that was felt to be the etiology. Her blood cultures were negative she had 30,000 colonies of Pseudomonas in her urine. She did receive broad-spectrum antibiotic therapy but ultimately her wound on the buttock was not felt to be the cause. As mentioned she has completed her antibiotics still using silver collagen 11/1; patient has completed her antibiotics for the underlying osteomyelitis. She apparently follows up with Dr. Drucilla Schmidt next week. The wound does not look too bad perhaps slightly narrower in terms of width but the depth is about the same. We have been using Hydrofera Blue. The patient talks to me about a wound VAC and made it sound as though she was recently on 1 although I do not see this. The other option is an advanced treatment product like Oasis but that means weekly trips into the clinic. She  has previously said she does not want an attempt at plastic surgery 11/15; the patient saw Dr. Drucilla Schmidt noted that her follow-up inflammatory markers normalized. She has completed her antibiotics. We are currently using Hydrofera Blue. Wound itself has some surface tissue over bone but certainly not a lot. I have looked back over her history. The patient has had recurrent osteomyelitis in this area as she is received prolonged courses of antibiotics. We did use a wound VAC for a prolonged period of time in 2021. We had some improvement especially in undermining areas but overall not a lot of measurable improvement. Once again I have tried to think about using advanced treatment products in this area something that would require them visiting the clinic very frequently and they did not seem to want to do this. Most recently we ran [facility] however she would have a $354 per application co-pay 56/2; left ischial tuberosity. I do not see much difference in 3 weeks. There is no exposed bone. We are using Hydrofera Blue. Our intake nurse reports some "greenish" drainage 12/21 left ischial tuberosity. Measuring slightly smaller in surface area. The wound still has some tissue over bone i.e. there is no exposed bone. No overt infection. We did a deep tissue culture last time for PCR. The major bacteria was Pseudomonas although there were medium titers of Enterococcus faecalis Klebsiella E. coli and Morganella as well as Peptostreptococcus. Keystone antibiotic include streptomycin and vancomycin they got this last weekend and used it twice 04/17/2021; no change in this wound. It does not have undermining however the surface is of it does not look particularly viable. We have been using topical antibiotic directed at a PCR culture as well as silver alginate. It is clear in talking to the patient and her husband that she does not offload this area adequately including spending all night on this with I think a level  2 surface on her bed.  I have told her that this is not adequate to heal a wound like this.. Objective Constitutional Sitting or standing Blood Pressure is within target range for patient.. Pulse regular and within target range for patient.Marland Kitchen Respirations regular, non-labored and within target range.. Temperature is normal and within the target range for the patient.Marland Kitchen Appears in no distress. Vitals Time Taken: 10:59 AM, Height: 63 in, Weight: 185 lbs, BMI: 32.8, Temperature: 97.5 F, Pulse: 84 bpm, Respiratory Rate: 16 breaths/min, Blood Pressure: 112/71 mmHg. General Notes: Wound exam; unfortunately not much improvement here. The wound itself has a very thin rim of tissue over the ischial tuberosity. The depth is the same here. The surface of the granulation does not look particularly good. No obvious surrounding infection. The wound itself is at the bottom of a large cone-shaped area secondary to previously healed ulcers in this area Integumentary (Hair, Skin) Wound #12 status is Open. Original cause of wound was Pressure Injury. The date acquired was: 09/06/2018. The wound has been in treatment 119 weeks. The wound is located on the Left Ischial Tuberosity. The wound measures 2.6cm length x 1cm width x 0.6cm depth; 2.042cm^2 area and 1.225cm^3 volume. There is Fat Layer (Subcutaneous Tissue) exposed. There is undermining starting at 12:00 and ending at 12:00 with a maximum distance of 0.8cm. There is a medium amount of serosanguineous drainage noted. The wound margin is well defined and not attached to the wound base. There is medium (34-66%) red, pink, friable granulation within the wound bed. There is a medium (34-66%) amount of necrotic tissue within the wound bed including Adherent Slough. Wound #14 status is Open. Original cause of wound was Pressure Injury. The date acquired was: 04/17/2021. The wound is located on the Lateral Ischial Tuberosity. The wound measures 1cm length x 0.3cm width x  0.1cm depth; 0.236cm^2 area and 0.024cm^3 volume. There is Fat Layer (Subcutaneous Tissue) exposed. There is no tunneling or undermining noted. There is a medium amount of serosanguineous drainage noted. The wound margin is distinct with the outline attached to the wound base. There is large (67-100%) red granulation within the wound bed. There is no necrotic tissue within the wound bed. 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 - Dr. Dellia Nims Bathing/ Shower/ Hygiene: May shower and wash wound with soap and water. - with dressing changes Edema Control - Lymphedema / SCD / Other: Elevate legs to the level of the heart or above for 30 minutes daily and/or when sitting, a frequency of: - throughout the day Off-Loading: Turn and reposition every 2 hours - *** Try to shift from side to side and shift to belly (if tolerated)**** Home Health: No change in wound care orders this week; continue Home Health for wound care. May utilize formulary equivalent dressing for wound treatment orders unless otherwise specified. Other Home Health Orders/Instructions: - [facility] WOUND #12: - Ischial Tuberosity Wound Laterality: Left Cleanser: Soap and Water (Home Health) 1 x Per Day/30 Days Discharge Instructions: May shower and wash wound with dial antibacterial soap and water prior to dressing change. Cleanser: Wound Cleanser (Home Health) 1 x Per Day/30 Days Discharge Instructions: Cleanse the wound with wound cleanser prior to applying a clean dressing using gauze sponges, not tissue or cotton balls. Peri-Wound Care: Skin Prep (Home Health) 1 x Per Day/30 Days Discharge Instructions: Use skin prep as directed Topical: Keystone antibiotic compound 1 x Per  Day/30 Days Discharge Instructions: Apply antibiotic compound to wound bed, under silver alginate Prim Dressing:  KerraCel Ag Gelling Fiber Dressing, 2x2 in (silver alginate) (Home Health) 1 x Per Day/30 Days ary Discharge Instructions: Apply silver alginate to wound bed as instructed Secondary Dressing: ComfortFoam Border, 4x4 in (silicone border) ([place]) 1 x Per Day/30 Days Discharge Instructions: Apply over primary dressing as directed. 1. I continued with the [facility] antibiotic compound with silver alginate 2. The patient was upset with me with regards to a perceived delay in initiating advanced treatment protocols specifically a advanced treatment product although in my view they have never really agreed to this. I had given them the option of an advanced treatment dressing such as Oasis, wound VAC and/or plastic surgery referral. I have tried to give them the pros and cons of each 1 of these. Nevertheless I think the patient was annoyed. 3. I told her that in order to have any chance of healing this no matter what we do she has to be off this for most of 24 hours a day even if she has pressure relief level 2 surface is on her mattress and cushions on her wheelchair unrelieved pressure will not work. She simply cannot sit on this area all night even in her bed 4. We will run Oasis through her insurance. They seem to agree that an attempt here is something they are willing to pay for even if they have a co-pay. Electronic Signature(s) Signed: 04/18/2021 7:40:11 AM By: Linton Ham MD Entered By: Linton Ham on 04/17/2021 12:37:07 -------------------------------------------------------------------------------- SuperBill Details Patient Name: Date of Service: [name], [name]. 04/17/2021 Medical Record Number: [id number] Patient Account Number: [id number] Date of Birth/Sex: Treating RN: 05-Apr-1951 (70 y.o. [name], [name] Primary Care Provider: [name] Other Clinician: Referring Provider: Treating Provider/Extender: Tommy Rainwater in Treatment: 119 Diagnosis  Coding ICD-10 Codes Code Description [phone] Pressure ulcer of left buttock, stage 4 E11.622 Type 2 diabetes mellitus with other skin ulcer Facility Procedures The patient participates with Medicare or their insurance follows the Medicare Facility Guidelines: CPT4 Code Description Modifier Quantity [id number] [place] VISIT-LEV 3 EST PT 1 Physician Procedures : CPT4 Code Description Modifier [id number] - WC PHYS LEVEL 4 - EST PT ICD-10 Diagnosis Description 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, stage 4 E11.622 Type 2 diabetes mellitus with other skin ulcer Quantity: 1 Electronic Signature(s) Signed: 04/17/2021 5:58:46 PM By: Levan Hurst RN, BSN Signed: 04/18/2021 7:40:11 AM By: Linton Ham MD Entered By: Levan Hurst on 04/17/2021 17:06:40

## 2021-05-08 ENCOUNTER — Encounter (HOSPITAL_BASED_OUTPATIENT_CLINIC_OR_DEPARTMENT_OTHER): Payer: Medicare Other | Admitting: Internal Medicine

## 2021-05-22 ENCOUNTER — Encounter (HOSPITAL_BASED_OUTPATIENT_CLINIC_OR_DEPARTMENT_OTHER): Payer: Medicare Other | Attending: Internal Medicine | Admitting: Internal Medicine

## 2021-05-22 ENCOUNTER — Other Ambulatory Visit: Payer: Self-pay

## 2021-05-22 DIAGNOSIS — E11622 Type 2 diabetes mellitus with other skin ulcer: Secondary | ICD-10-CM | POA: Insufficient documentation

## 2021-05-22 DIAGNOSIS — E039 Hypothyroidism, unspecified: Secondary | ICD-10-CM | POA: Diagnosis not present

## 2021-05-22 DIAGNOSIS — G8220 Paraplegia, unspecified: Secondary | ICD-10-CM | POA: Insufficient documentation

## 2021-05-22 DIAGNOSIS — I10 Essential (primary) hypertension: Secondary | ICD-10-CM | POA: Insufficient documentation

## 2021-05-22 DIAGNOSIS — L89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, stage 4: Secondary | ICD-10-CM | POA: Diagnosis present

## 2021-05-22 DIAGNOSIS — Z87891 Personal history of nicotine dependence: Secondary | ICD-10-CM | POA: Insufficient documentation

## 2021-05-22 NOTE — Progress Notes (Signed)
[name], [name] ([id number]) Visit Report for 05/22/2021 HPI Details Patient Name: Date of Service: [name], [name]. 05/22/2021 10:00 A M Medical Record Number: [id number] Patient Account Number: [id number] Date of Birth/Sex: Treating RN: 1950/06/14 (71 y.o. [name], [name] Primary Care Provider: [name] Other Clinician: Referring Provider: Treating Provider/Extender: Tommy Rainwater in Treatment: 124 History of Present Illness Location: open ulceration of the left gluteal area, left heel and right ankle for about 5 months. Quality: Patient reports No Pain. Severity: Patient states wound(s) are getting worse. Duration: Patient has had the wound for > 5 months prior to seeking treatment at the wound center Context: The wound occurred when the patient has been paraplegic for about 3 years. Modifying Factors: Wound improving due to current treatment. ssociated Signs and Symptoms: Patient reports having foul odor. A HPI Description: this 71 year old patient who is known to have hypertension, hypothyroidism, breast cancer, chronic pain syndrome, paraplegia was noted to have a left gluteal decubitus ulcer and was brought into the hospital. During the course of her hospitalization she was debrided in the operating room by ankle wound. Bone cultures were taken at that time but were negative but clinically she was treated for osteomyelitis because of the probing down to bone and open exposed bone. Home health has been giving her antibioticss which include vancomycin and Zosyn. The patient was a smoker until about 3 weeks ago and used to smoke about 10 cigarettes a day for a long while. 12/13/2014 - details of her operative note from 11/03/2014 were reviewed -- PROCEDURE: 1. Excisional debridement skin, subcutaneous, muscle left ischium 35 cm2 2. Excisional debridement skin, subcutaneous tissue left heel 27 cm2 3. Excisional debridement right ankle skin, subcutaneous, bone  30 cm2 01/24/2015 -- she has some issues with her wheelchair cushion but other than that is doing very well and has received Podus boots for her feet. 02/14/2015 -- she was using her old offloading boots and this seemed to have caused her a new pressure ulcer on the left posterior heel near the superior part just below the Achilles tendon. 03/07/2015 -- she has a new ulceration just to the left of the midline on her sacral region more on the left buttock and this has been there for Dr. Leland Johns and had all the wounds sharply debrided. The debridement was done for the left ischial wound, the left heel wound and the right about a week. 08/22/2015 -- was recently admitted to hospital between May 5 and 08/13/2015, with sepsis and leukocytosis due to a UTI. she was treated for a sepsis complicating Escherichia coli UTI and kidney stones. She also had metabolic and careful up at the secondary to pyelonephritis. He received broad-spectrum antibiotics initially and then received Macrobid as per urology. She was sent home on nitrofurantoin. during her admission she had a CT scan which showed exposed left ischial tuberosity without evidence of osteolysis. 09/12/2015-- the patient is having some issues with her air mattress and would like to get a opinion from medical modalities. 10/10/2015 -- the issue with her air mattress has not yet been sorted out and the new problem seems to be a lot of odor from the wound VAC. 11/27/2015 -- the patient was admitted to the hospital between July 23 and 10/31/2015. Her problems were sepsis, osteomyelitis of the pelvic bone and acute pyelonephritis. CT of the abdomen and pelvis was consistent with a left-sided pyelonephritis with hydronephrosis and also just showed new sclerosis of the posterior portion of  the left anterior pubic ramus suggestive of periosteal reaction consistent with osteomyelitis. She was treated for the osteomyelitis with infectious disease consult  recommending 6 weeks of IV antibiotics including vancomycin and Rocephin and the antibiotics were to go on until 12/10/2015. He was seen by Dr. Iran Planas plastic surgery and Dr. Linus Salmons of infectious disease. She had a suprapubic catheter placed during the admission. CT scan done on 10/28/2015 showed specifically -- New sclerosis of the posterior portion of the left inferior pubic ramus with aggressive periosteal reaction, consistent with osteomyelitis, with adjacent soft tissue gas compatible with previously described decubitus ulcer. 12/12/2015 -- she was recently seen by Dr. Linus Salmons, who noted good improvement and CRP and ESR compared to before and he has stopped her antibiotic as per plans to finish on September 4. The patient was encouraged to continue with wound care and consider hyperbaric oxygen therapy. Today she tells me that she has consented to undergo hyperbaric oxygen therapy and we can start the paperwork. 01/02/2016 -- her PCP had gained about 3 years but she still persists in having problems during hyperbaric oxygen therapy with some discomfort in the ears. 01/09/16; pressure area with underlying osteomyelitis in the left buttock. Wound bed itself has some slight amount of grayish surface slough however I do not think any debridement was necessary. There is no exposed bone soft tissue appears stable. She is using a wound VAC 01/16/16; back for weekly wound review in conjunction with HBO. She has a deep wound over the left initial tuberosity previously treated with 6 weeks of IV antibiotics for osteomyelitis. Wound bed looks reasonably healthy although the base of this is still precariously close to bone. She has been using a wound VAC. 01/23/2016 -- she has completed her course of antibiotics and this week the only new thing is her right great toe nail was avulsed and she has got an open wound over the nailbed. 01/31/16 she has completed her course of antibiotics. Her right great toenail  avulsed last week and she's been using silver alginate for this as well. Still using a wound VAC to the substantial stage IV wound over the left ischial tuberosity 03/05/2016 -- the patient has had a opinion from the plastic surgery group at [facility] and details of this are not available yet but the patient's verbal report has been heard by me. Did not sound like there was any optimistic discussion regarding reconstruction and the net result would be to continue with the wound VAC application. I will await the official reports. Addendum: -- she was seen at [facility] by Dr. Tressa Busman. After a thorough review and from what I understand spending 45 minutes with the patient his assessment has been noted by me in detail and the management options were: 1. Continued pressure offloading and wound care versus operative procedures including wound excision 2. Soft tissue and bone sampling 3. If the wound gets larger wound closure would be done using a variety of plastic surgical techniques including but not limited to skin substitute, possible skin graft, local versus regional flaps, negative pressure dressing application. 4. He discussed with her details of flap surgery and the risks associated 5. He made a comment that since the patient was operated on by Dr. Leland Johns of [facility] plastic surgery unit in [place] the patient may continue to follow-up there for further evaluation for surgical flap closure in the future. 03/19/2016 -- the patient continues to be rather depressed and frustrated  with her lack of rapid progress in healing this wound especially because she thought after hyperbaric oxygen therapy the wound would heal extremely fast. She now understands that was not the implied benefit on wound care which was the recommendation for hyperbaric oxygen therapy. I have had a lengthy discussion with the patient and her husband regarding her  options: 1. Continue with collagen and wound VAC for the primary dressing and offloading and all supportive care. 2. See Dr. Iran Planas for possible placement of Acell or Integra in the OR. 3. get a second opinion from a wound care center and surrounding regions/counties 05/07/2016 -- Note from Dr. Celedonio Miyamoto, who noted that the patient has declined flap surgery. She has discussed application of A cell, and try a few applications to see how the wound progresses. She is also recommended that we could apply products here in the wound center, like Oasis. during her preop workup it was found that her hemoglobin A1c was 11% and she has now been diagnosed as having diabetes mellitus and has been put on appropriate treatment by her PCP 05/28/2016 -- tells me her blood sugars have been doing well and she has an appointment to see her PCP in the next couple of weeks to check her hemoglobin A1c. Other than that she continues to do well. 06/25/2016 -- have not seen her back for the last month but she says her health has been about the same and she has an appointment to check the A1c next week 09/10/16 ---- was seen by Dr. Celedonio Miyamoto -- who applied Acell and saw her back in follow-up. She has recommended silver alginate to the wound every other day and cover with foam. If no significant drainage could transition to collagen every other day. She recommended discontinuing wound VAC. There were no plans to repeat application of Acell. The patient expressed that her husband could do the wound care as going to the Wound Ctr., would cost several $100 for each visit. 10/21/2016 -- her insurance company is getting her new mattress and she is pleased about that. Other than that she has been doing dressings with PolyMem Silver and has been doing very well 02/18/2017 -- she has gone through several changes of her mattress and has not been pleased with any of them. The ventricles are still working on trying to  fit her with the appropriate low air-loss mattress. She has a new wound on the gluteal area which is clearly separated from the original wound. 03/25/17-she is here in follow-up evaluation for her left ischialpressure ulcer. She remains unsatisfied with her pressure mattress. She admits to sitting multiple hours a day, in the bed. We have discussed offloading options. The wound does not appear infected. Nutrition does not appear to be a concern. Will follow-up in 4 weeks, if wound continues to be stalled may consider x-ray to evaluate for refractory osteomyelitis. 04/21/17; this is a patient that I don't know all that well. She has a chronic wound which at one point had underlying osteomyelitis in the left ischial tuberosity. This is a stage IV pressure ulcer. Over the last 3 months she has a stage II wound inferiorly to the original wound. The last time she was here her dressing was changed to silver collagen although the patient's husband who changes the dressing said that the collagen stuck to the wound and remove skin from the superficial area therefore he switched back to Manson 05/13/17; this is a patient we've been following for a left  ischial tuberosity wound which was stage IV at one point had underlying osteomyelitis. Over the last several months she's had a stage II wound just inferior and medial to the related to the wound. According to her husband he is using Endoform layer with collagen although this is not what I had last time. According to her husband they are using Elgie Congo with collagen although I don't quite know how that started. She was hospitalized from 1/20 through 04/30/16. This was related to a UTI. Her blood cultures were negative, urine culture showed multiple species. She did have a CT scan of the abdomen and pelvis which documented chronic osteomyelitis in the area of the wound inflammatory markers were unremarkable. She has had prior knowledge of osteomyelitis. It  looks as though she received IV antibiotics in 2017 and was treated with a course of hyperbaric oxygen. 05/28/17; the wound over the left ischial tuberosity is deeper today and abuts clearly on bone. Nursing intake reported drainage. I therefore culture of the wound. The more superficial area just below this looks about the same. They once again complained that there are mattress cover is not working although apparently [facility] is been noted to see this many times in the report is that the device is functional 06/18/17; the patient had a probing area on the left ischial tuberosity that was draining purulent fluid last time. This also clearly seemed to have open bone. Culture I did showed pansensitive pseudomonas including third generation cephalosporins. I treated this with cefdinir 300 twice a day for 10 days and things seem to have improved. She has a more superficial wound just underneath this area. Amazingly she has a new air mattress through [facility]. I think they gave this to her as a parking give. In any case this now works according to the patient may have something to do with why the areas are looking better. 07/09/17; the patient has a probing area in the left ischial tuberosity that still has some depth. However this is contracted in terms of the wound orifice although the depth is still roughly the same. There is no undermining. She also has the satellite wound which is more superficial. This appears to have a healthy surface we've been using silver collagen 08/06/17; the patient's wound is over the left ischial tuberosity and a satellite lesion just underneath this. The original wound was actually a deep stage 4 wound. We have made good progress in 2 months and there is no longer exposed bone here. 09/03/17; left ischial tuberosity actually appears to be quite healthy. I think we are making progress. No debridement is required. There is no surrounding erythema 10/01/17 I  follow this patient monthly for her left ischial tuberosity wound. There is 2 areas the original area and a satellite area. The satellite area looks a lot better there is no surrounding erythema. Her husband relates that he is having trouble maintaining the dressing. This has to do with the soft tissue around it. He states he puts the collagen in but he cannot make sure that it stays in even with the ABD pads and tape that he is been using 10/29/17; patient arrives with a better looking noon today. Some of the satellite lesions have closed. using Prisma 11/26/17; the patient has a large cone-shaped area with the tip of the Cone deep within her buttock soft tissue. The walls of the Cone are epithelialized however the base is still open. The area at the base of this looks  moist we've been using silver collagen. Will change to silver alginate 12/31/2017; the wound appears to have come in fairly nicely. Using silver alginate. There is no surrounding maceration or infection 01/28/18; there is still an open area here over the left initial tuberosity. Base of this however looks healthy. There is no surrounding infection 02/25/18; the area of its open is over the left ischial tuberosity. The base of this is where the wound is. This is a large inverted cone-shaped area with the wound at the tip. Dimensions of the wound at the tip are improved. There is a area of denuded skin about halfway towards the tip which her husband thinks may have happened today when he was bathing her. 04/20/17; the area is still open over the left initial tuberosity. 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 This is now completely closed over although it is closed over with a divot and skin over bone at the base of this. Continued aggressive  offloading will be necessary. 12/30/2018 READMISSION This is a 71 year old woman with chronic paraplegia. I picked her up for her care from Dr. Con Memos in this clinic after he departed. She had a stage IV pressure wound over the left ischial tuberosity. She was treated twice for her underlying osteomyelitis and this I believe firstly in 2016 and again in 2017. There were some plans at some point for flap closure of this however she was discovered to have uncontrolled diabetes and I do not think this was ever accomplished. She ultimately healed over in this clinic and was discharged in May. She has a large cone-shaped indentation with the tip of this going towards the left ischial tuberosity. It is not an easy area to examine but at that time I thought all of this was epithelialized. Apparently there was a reopening here shortly after she left the clinic last time. She was admitted to hospital at the end of June for Klebsiella bacteremia felt to be secondary to UTI. A CT scan of the pelvis is listed below and there was initially some concern that she had underlying osteomyelitis although I believe she was seen by infectious disease and that was felt to be not the case: I do not see any new cultures or inflammatory markers IMPRESSION: 1. No CT evidence for acute intra-abdominal or pelvic abnormality. Large volume of stool throughout the colon. 2. Enlarged fatty liver with fat sparing near the gallbladder fossa 3. Cortical scarring right kidney. Bilateral intrarenal stones without hydronephrosis. Thick-walled urinary bladder decompressed by suprapubic catheter 4. Deep left decubitus ulcer with underlying left ischial changes suggesting osteomyelitis. Her husband has been using silver collagen in the wound. She has not been systemically unwell no fever chills eating and drinking well. They rigorously offload this wound only getting up in the wheelchair when she is going to appointments the rest of the  time she is in bed. 10/8; wound measures larger and she now has exposed bone. We have been using silver alginate 11/12 still using silver alginate. The patient saw Dr. Megan Salon of infectious disease. She was started on Augmentin 500 mg twice daily. She is due to follow-up with Dr. Megan Salon I believe next week. Lab work Dr. Megan Salon requested showed a sedimentation rate of 28 and CRP of 20 although her CRP 1 year ago  was 18.8. Sedimentation rate 1 year ago was 11 basic metabolic panel showed a creatinine of 1.12 12/3; the patient followed up with Dr. Megan Salon yesterday. She is still on Augmentin twice daily. This was directed by Dr. Megan Salon. The patient's inflammatory markers have improved which is gratifying. Her C-reactive protein was repeated yesterday and follow-up booked with infectious disease in January. In addition I have been getting secure text messages I think from palliative care through [facility] [employer]. I think they were hoping to provide services to the patient in her home. They could not get a hold of the primary physician and so they reached out to me on 2 separate occasions. 12/17; patient last saw Dr. Megan Salon on 12/2. She is finishing up with Augmentin. Her C-reactive protein was 20 on 10/21, 10.1 on 11/19 and 17 on 12/2. The wound itself still has depth and undermining. We are using Santyl with the backing wet-to-dry 04/27/2019. The wound is gradually clearing up in terms of the surface although it is not filled in that much. Still abuts right against bone 2/4; patient with a deep pressure ulcer over the left ischial tuberosity. I thought she was going to follow-up with infectious disease to follow her inflammatory markers although the patient states that they stated that they did not need to see her unless we felt it was necessary. I will need to check their notes. In any case we ordered moistened silver collagen back with wet-to-dry to fill in the depth of  the wound although apparently [facility] sent silver alginate which they have been using since they were here the last time. Is obviously not what we ordered. 2/25. Not much change in this wound it is over the left ischial tuberosity recurrent wound. We have been using silver collagen with backing wet-to-dry. I think the wound is about the same. There is still some tunneling from about 10-12 o'clock over the ischial tuberosity itself 3/11; pressure ulcer over the left ischial tuberosity. Since she was last here the wound VAC was started and apparently going quite well. We are able to get the home health company that accepts [place] healthcare which is in itself sometimes problematic. There is been improvements in the wound the tunneling seems to be better and is contracted nicely 4/8; 1 month follow-up. Since she was last here we have been using silver collagen under a wound VAC. Some minor contraction I think in wound volume. She is cared for diligently by her husband including pressure relief, incontinence management, nutritional support etc. 6/1; this is almost a 33-monthfollow-up. She is been using silver collagen under wound VAC. Circular area over the left ischial tuberosity. She has been using silver collagen under wound VAC 7/8; 1 month follow-up. Silver collagen under the VAC not really a lot of progress. Tissue at the base of the wound which is right against bone and the tissue next that this does not look completely viable. She is not currently on any antibiotics, she had underlying osteomyelitis I need to look this over 8/16; we are using silver collagen under wound VAC to the left ischial tuberosity wound. Comes in today with absolutely no change in surface area or depth. There is no exposed bone. I did look over her infectious disease notes as I said I would do last time. She last saw Dr. CMegan Salonin December 2020. She completed 6 weeks of Augmentin. This was in response to a bone culture I  did showing methicillin susceptible staph aureus  and Enterococcus. She was supposed to come back to see Dr. Megan Salon at some point although they say that that appointment was canceled unless I chose to recommend return. I think there was supposed to be follow-up with inflammatory markers but I cannot see that that was ever done. She has not been on antibiotics since 9/21; monthly follow-up. We received a call from home health nurse last evening to report green drainage coming out of the wound. Lab work I ordered last time showed a white count of 5.2 a sedimentation rate of 45 and a C-reactive protein of 25 however neither one of the 2 values are substantially different from her previous values in October 2020 or December 2020. Both are slightly higher but only marginally. Otherwise no new complaints from the patient or her husband 10/19; 1 month follow-up. PCR culture I did last time showed medium quantities of Pseudomonas lower quantities Klebsiella and Enterococcus faecalis group B strep and Peptostreptococcus. I gave her Augmentin for 2 weeks. I am not really sure of my choice of this I would not cover Pseudomonas. She is still having green drainage. Wound itself looks satisfactory there is not a lot of depth wound bed looks healthy 11/16; patient has completed the antibiotics still using gentamicin and silver alginate on the wound. There is improvement in the surface area 12/21; in general the area on the buttock looks somewhat better. Surface looks healthy although I do not know that there is been much improvement in the wound volume. We have been using silver alginate and Hydrofera Blue. Less drainage. In passing the husband showed me an abrasion injury on the left anterior tibia. Covered in necrotic surface. He has noticed this for about a week and has been putting silver collagen on it. He is completely uncertain about how this happened 1/25; monthly follow-up. The area on the left buttock is  about the same. This does not go to bone but a fairly deep wound surface of the wound is of questionable viability. The abrasion injury that they showed me last time apparently was closed out by home health because they thought it was healed but certainly is not although it is just about healed. As a result they haven't been applying anything to this area Finally I did discuss with the patient and her husband the idea of an advanced treatment product to try and get a proper base to this wound I was thinking of Puraply however actually the patient points out that her co-pay for coming to visit [place] i.e. the facility, charge would be unaffordable if they have to, on a weekly basis 2/22; pressure ulcer on the left buttock appears deeper to me and abuts on the ischial tuberosity. I thought initially there was exposed bone but there is a rim of tissue over this area. She also has a superficial over the right anterior mid tibia. Been using silver collagen to these areas without much success. I have looked over the patient's past history with regards to the area on the left ischium. She did have underlying osteomyelitis here dating back I think to late 2020. She saw Dr. Megan Salon she received a 6-week course of oral antibiotics in response to a bone culture that I did. This does not appear to be infected but it certainly has not been improving in terms of granulation. I do not believe she has had any recent imaging studies 3/29; 1 month follow-up. Pressure ulcer on the left buttock which she has been dealing with with  for a number of years. She was treated for underlying osteomyelitis at 1.2 or 3 years ago I think with infectious disease help. She also had I think a flap closure by Dr. Leland Johns and that lasted for about a year and then reopened. I have not been able to get this patient to progress towards healing although truthfully the wound is absolutely no worse. We have been using Hydrofera Blue 4/26;  patient presents for 1 week follow-up. She has been using silver collagen to the area every other day. She has home health that comes out once a week to help with dressing changes as well. The patient is interested in trying a skin substitute over this area. She states she is trying to relieve pressure off of it most of the day. 5/24; patient presents for 1 month follow-up. She has been using silver collagen to the area every other day. She has no complaints today. She is interested in the skin substitute. She tries to leave relieve pressure off Her bottom however is not able to most of the day 6/14; using silver collagen to the area over the left ischial tuberosity. Wound does not appear to be doing particularly well. Open to bone 6/28; the patient patient presented last time with a marked deterioration. Depth probing all the way to bone. The bone itself did not look particularly viable. In spite of this the x-ray I did showed longstanding ulcer over the left ischial tuberosity with chronic bone involvement/reaction that was also seen by CT in 2020. Lab work did not show just active infection with a sed rate of 14 and a C-reactive protein of 7.1. Her comprehensive metabolic panel was normal including an albumin of 4.1 white count was 6 7/19; patient was here 3 weeks ago with a marked deterioration in her wound over the left ischial tuberosity. I ordered a CT scan of the area for 1 reason or another this just did not get done. It is now booked for 8 days from now. She was supposed to come back for bone culture and pathology. That did not happen either. We have been using silver collagen. As usual she is diligently looked after by her husband 8/24; 5-week follow-up. Since the patient was last here the biopsy that I did of her ischial tuberosity came back suggesting osteomyelitis. Culture showed strep. I started her on Augmentin. She was seen by infectious disease Dr. Lucianne Lei dam and the Augmentin was  continued and she is still taking it. CT scan did not suggest anything other than chronic osteomyelitis without much change from her previous study. Her C-reactive protein was only 7 and sedimentation rate was 1.1. We are still using silver collagen to the wound. She has home health. Her husband is very diligent in her care for this reason I have never pursued a diverting colostomy. She would not agree to this surgery in any case. Finally we have discussed plastic surgery with him in the past and she is not interested in a myocutaneous flap. 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 Since she was last here she was in the ER last week with abdominal pain. She was found to be impacted however in the course of the review there somebody gave her some IV morphine and apparently she developed hives and blisters. There is still tense blisters on her plantar left fifth and fourth fingers 10/4; the patient has completed her Augmentin and is due to follow-up with Dr. Drucilla Schmidt in early November. Her wound  today measures about the same but looks a little healthier in terms of granulation there is no exposed bone. I note that she was admitted to hospital for 2 days from 9/21 through 9/23 for delirium. She had received Versed for glaucoma surgery ultimately that was felt to be the etiology. Her blood cultures were negative she had 30,000 colonies of Pseudomonas in her urine. She did receive broad-spectrum antibiotic therapy but ultimately her wound on the buttock was not felt to be the cause. As mentioned she has completed her antibiotics still using silver collagen 11/1; patient has completed her antibiotics for the underlying osteomyelitis. She apparently follows up with Dr. Drucilla Schmidt next week. The wound does not look too bad perhaps slightly narrower in terms of width but the depth is about the same. We have been using Hydrofera Blue. The patient talks to me about a wound VAC and made it sound as though she was  recently on 1 although I do not see this. The other option is an advanced treatment product like Oasis but that means weekly trips into the clinic. She has previously said she does not want an attempt at plastic surgery 11/15; the patient saw Dr. Drucilla Schmidt noted that her follow-up inflammatory markers normalized. She has completed her antibiotics. We are currently using Hydrofera Blue. Wound itself has some surface tissue over bone but certainly not a lot. I have looked back over her history. The patient has had recurrent osteomyelitis in this area as she is received prolonged courses of antibiotics. We did use a wound VAC for a prolonged period of time in 2021. We had some improvement especially in undermining areas but overall not a lot of measurable improvement. Once again I have tried to think about using advanced treatment products in this area something that would require them visiting the clinic very frequently and they did not seem to want to do this. Most recently we ran [place] however she would have a $597 per application co-pay 41/6; left ischial tuberosity. I do not see much difference in 3 weeks. There is no exposed bone. We are using Hydrofera Blue. Our intake nurse reports some "greenish" drainage 12/21 left ischial tuberosity. Measuring slightly smaller in surface area. The wound still has some tissue over bone i.e. there is no exposed bone. No overt infection. We did a deep tissue culture last time for PCR. The major bacteria was Pseudomonas although there were medium titers of Enterococcus faecalis Klebsiella E. coli and Morganella as well as Peptostreptococcus. [facility] antibiotic include streptomycin and vancomycin they got this last weekend and used it twice 04/17/2021; no change in this wound. It does not have undermining however the surface is of it does not look particularly viable. We have been using topical antibiotic directed at a PCR culture as well as silver alginate. It is  clear in talking to the patient and her husband that she does not offload this area adequately including spending all night on this with I think a level 2 surface on her bed. I have told her that this is not adequate to heal a wound like this.. 2/15; Oasis with a $384 co-pay per application. They are using silver alginate to the wound offloading as best they can according to her husband Electronic Signature(s) Signed: 05/22/2021 4:36:32 PM By: Linton Ham MD Entered By: Linton Ham on 05/22/2021 11:35:08 -------------------------------------------------------------------------------- Physical Exam Details Patient Name: Date of Service: [name]. 05/22/2021 10:00 A M Medical Record Number: [id number] Patient Account Number:  [id number] Date of Birth/Sex: Treating RN: 1950/07/05 (71 y.o. Benjaman Lobe Primary Care Provider: [name] Other Clinician: Referring Provider: Treating Provider/Extender: Tommy Rainwater in Treatment: 105 Constitutional Patient is hypertensive.. Pulse regular and within target range for patient.Marland Kitchen Respirations regular, non-labored and within target range.. Temperature is normal and within the target range for the patient.Marland Kitchen Appears in no distress. Notes Wound exam; left ischial tuberosity. I do not think there is much change from a month ago. The depth is about the same the amount of tissue over bone is really very limited. There is no evidence of infection in the soft tissue. Electronic Signature(s) Signed: 05/22/2021 4:36:32 PM By: Linton Ham MD Entered By: Linton Ham on 05/22/2021 11:36:16 -------------------------------------------------------------------------------- Physician Orders Details Patient Name: Date of Service: [name], [name]. 05/22/2021 10:00 A M Medical Record Number: [id number] Patient Account Number: [id number] Date of Birth/Sex: Treating RN: Jul 26, 1950 (71 y.o. [name] Primary  Care Provider: [name] Other Clinician: Referring Provider: Treating Provider/Extender: Tommy Rainwater in Treatment: [phone] Verbal / Phone Orders: No Diagnosis Coding Follow-up Appointments Return appointment in 1 month. - Dr. Dellia Nims room 1 Bathing/ Shower/ Hygiene May shower and wash wound with soap and water. - with dressing changes Edema Control - Lymphedema / SCD / Other Elevate legs to the level of the heart or above for 30 minutes daily and/or when sitting, a frequency of: - throughout the day Off-Loading Turn and reposition every 2 hours - *** Try to shift from side to side and shift to belly (if tolerated)**** Home Health New wound care orders this week; continue Home Health for wound care. May utilize formulary equivalent dressing for wound treatment orders unless otherwise specified. - change primary dressing to prisma Other Home Health Orders/Instructions: - [facility] Wound Treatment Wound #12 - Ischial Tuberosity Wound Laterality: Left Cleanser: Soap and Water (Home Health) 3 x Per Week/30 Days Discharge Instructions: May shower and wash wound with dial antibacterial soap and water prior to dressing change. Cleanser: Wound Cleanser (Home Health) 3 x Per Week/30 Days Discharge Instructions: Cleanse the wound with wound cleanser prior to applying a clean dressing using gauze sponges, not tissue or cotton balls. Peri-Wound Care: Skin Prep (Home Health) 3 x Per Week/30 Days Discharge Instructions: Use skin prep as directed Prim Dressing: Promogran Prisma Matrix, 4.34 (sq in) (silver collagen) 3 x Per Week/30 Days ary Discharge Instructions: Moisten collagen with saline or hydrogel Secondary Dressing: Woven Gauze Sponges 2x2 in 3 x Per Week/30 Days Discharge Instructions: moisten with saline and pack lightly over collagen Secondary Dressing: ComfortFoam Border, 4x4 in (silicone border) ([place]) 3 x Per Week/30 Days Discharge Instructions: Apply over  primary dressing as directed. Wound #14 - Ischial Tuberosity Wound Laterality: Lateral Prim Dressing: Promogran Prisma Matrix, 4.34 (sq in) (silver collagen) 3 x Per Week/30 Days ary Discharge Instructions: Moisten collagen with saline or hydrogel Secondary Dressing: Zetuvit Plus Silicone Border Dressing 4x4 (in/in) 3 x Per Week/30 Days Discharge Instructions: Apply silicone border over primary dressing as directed. Electronic Signature(s) Signed: 05/22/2021 4:36:32 PM By: Linton Ham MD Signed: 05/22/2021 5:14:15 PM By: Baruch Gouty RN, BSN Entered By: Baruch Gouty on 05/22/2021 11:24:06 -------------------------------------------------------------------------------- Problem List Details Patient Name: Date of Service: [name], [name]. 05/22/2021 10:00 A M Medical Record Number: [id number] Patient Account Number: [id number] Date of Birth/Sex: Treating RN: Mar 12, 1951 (71 y.o. [name], [name] Primary Care Provider: [name] Other Clinician: Referring Provider: Treating Provider/Extender: Dellia Nims  [name], [name] in Treatment: 124 Active Problems ICD-10 Encounter Code Description Active Date MDM Diagnosis 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, stage 4 12/30/2018 No Yes E11.622 Type 2 diabetes mellitus with other skin ulcer 12/30/2018 No Yes Inactive Problems ICD-10 Code Description Active Date Inactive Date G82.20 Paraplegia, unspecified 12/30/2018 12/30/2018 M86.68 Other chronic osteomyelitis, other site 02/17/2019 02/17/2019 S80.811D Abrasion, right lower leg, subsequent encounter 03/27/2020 03/27/2020 M86.68 Other chronic osteomyelitis, other site 11/28/2020 11/28/2020 L97.811 Non-pressure chronic ulcer of other part of right lower leg limited to breakdown of skin 03/27/2020 03/27/2020 Resolved Problems Electronic Signature(s) Signed: 05/22/2021 4:36:32 PM By: Linton Ham MD Entered By: Linton Ham on 05/22/2021  11:33:20 -------------------------------------------------------------------------------- Progress Note Details Patient Name: Date of Service: [name]. 05/22/2021 10:00 A M Medical Record Number: [id number] Patient Account Number: [id number] Date of Birth/Sex: Treating RN: 1950/09/12 (71 y.o. [name], [name] Primary Care Provider: [name] Other Clinician: Referring Provider: Treating Provider/Extender: Tommy Rainwater in Treatment: 124 Subjective History of Present Illness (HPI) The following HPI elements were documented for the patient's wound: Location: open ulceration of the left gluteal area, left heel and right ankle for about 5 months. Quality: Patient reports No Pain. Severity: Patient states wound(s) are getting worse. Duration: Patient has had the wound for > 5 months prior to seeking treatment at the wound center Context: The wound occurred when the patient has been paraplegic for about 3 years. Modifying Factors: Wound improving due to current treatment. Associated Signs and Symptoms: Patient reports having foul odor. this 71 year old patient who is known to have hypertension, hypothyroidism, breast cancer, chronic pain syndrome, paraplegia was noted to have a left gluteal decubitus ulcer and was brought into the hospital. During the course of her hospitalization she was debrided in the operating room by ankle wound. Bone cultures were taken at that time but were negative but clinically she was treated for osteomyelitis because of the probing down to bone and open exposed bone. Home health has been giving her antibioticss which include vancomycin and Zosyn. The patient was a smoker until about 3 weeks ago and used to smoke about 10 cigarettes a day for a long while. 12/13/2014 - details of her operative note from 11/03/2014 were reviewed -- PROCEDURE: 1. Excisional debridement skin, subcutaneous, muscle left ischium 35 cm2 2. Excisional  debridement skin, subcutaneous tissue left heel 27 cm2 3. Excisional debridement right ankle skin, subcutaneous, bone 30 cm2 01/24/2015 -- she has some issues with her wheelchair cushion but other than that is doing very well and has received Podus boots for her feet. 02/14/2015 -- she was using her old offloading boots and this seemed to have caused her a new pressure ulcer on the left posterior heel near the superior part just below the Achilles tendon. 03/07/2015 -- she has a new ulceration just to the left of the midline on her sacral region more on the left buttock and this has been there for Dr. Leland Johns and had all the wounds sharply debrided. The debridement was done for the left ischial wound, the left heel wound and the right about a week. 08/22/2015 -- was recently admitted to hospital between May 5 and 08/13/2015, with sepsis and leukocytosis due to a UTI. she was treated for a sepsis complicating Escherichia coli UTI and kidney stones. She also had metabolic and careful up at the secondary to pyelonephritis. He received broad-spectrum antibiotics initially and then received Macrobid as per urology. She was sent home on  nitrofurantoin. during her admission she had a CT scan which showed exposed left ischial tuberosity without evidence of osteolysis. 09/12/2015-- the patient is having some issues with her air mattress and would like to get a opinion from medical modalities. 10/10/2015 -- the issue with her air mattress has not yet been sorted out and the new problem seems to be a lot of odor from the wound VAC. 11/27/2015 -- the patient was admitted to the hospital between July 23 and 10/31/2015. Her problems were sepsis, osteomyelitis of the pelvic bone and acute pyelonephritis. CT of the abdomen and pelvis was consistent with a left-sided pyelonephritis with hydronephrosis and also just showed new sclerosis of the posterior portion of the left anterior pubic ramus suggestive of periosteal  reaction consistent with osteomyelitis. She was treated for the osteomyelitis with infectious disease consult recommending 6 weeks of IV antibiotics including vancomycin and Rocephin and the antibiotics were to go on until 12/10/2015. He was seen by Dr. Iran Planas plastic surgery and Dr. Linus Salmons of infectious disease. She had a suprapubic catheter placed during the admission. CT scan done on 10/28/2015 showed specifically -- New sclerosis of the posterior portion of the left inferior pubic ramus with aggressive periosteal reaction, consistent with osteomyelitis, with adjacent soft tissue gas compatible with previously described decubitus ulcer. 12/12/2015 -- she was recently seen by Dr. Linus Salmons, who noted good improvement and CRP and ESR compared to before and he has stopped her antibiotic as per plans to finish on September 4. The patient was encouraged to continue with wound care and consider hyperbaric oxygen therapy. Today she tells me that she has consented to undergo hyperbaric oxygen therapy and we can start the paperwork. 01/02/2016 -- her PCP had gained about 3 years but she still persists in having problems during hyperbaric oxygen therapy with some discomfort in the ears. 01/09/16; pressure area with underlying osteomyelitis in the left buttock. Wound bed itself has some slight amount of grayish surface slough however I do not think any debridement was necessary. There is no exposed bone soft tissue appears stable. She is using a wound VAC 01/16/16; back for weekly wound review in conjunction with HBO. She has a deep wound over the left initial tuberosity previously treated with 6 weeks of IV antibiotics for osteomyelitis. Wound bed looks reasonably healthy although the base of this is still precariously close to bone. She has been using a wound VAC. 01/23/2016 -- she has completed her course of antibiotics and this week the only new thing is her right great toe nail was avulsed and she has got an  open wound over the nailbed. 01/31/16 she has completed her course of antibiotics. Her right great toenail avulsed last week and she's been using silver alginate for this as well. Still using a wound VAC to the substantial stage IV wound over the left ischial tuberosity 03/05/2016 -- the patient has had a opinion from the plastic surgery group at [facility] and details of this are not available yet but the patient's verbal report has been heard by me. Did not sound like there was any optimistic discussion regarding reconstruction and the net result would be to continue with the wound VAC application. I will await the official reports. Addendum: -- she was seen at [facility] by Dr. Tressa Busman. After a thorough review and from what I understand spending 45 minutes with the patient his assessment has been noted by me in detail and the management options were:  1. Continued pressure offloading and wound care versus operative procedures including wound excision 2. Soft tissue and bone sampling 3. If the wound gets larger wound closure would be done using a variety of plastic surgical techniques including but not limited to skin substitute, possible skin graft, local versus regional flaps, negative pressure dressing application. 4. He discussed with her details of flap surgery and the risks associated 5. He made a comment that since the patient was operated on by Dr. Leland Johns of [facility] plastic surgery unit in [place] the patient may continue to follow-up there for further evaluation for surgical flap closure in the future. 03/19/2016 -- the patient continues to be rather depressed and frustrated with her lack of rapid progress in healing this wound especially because she thought after hyperbaric oxygen therapy the wound would heal extremely fast. She now understands that was not the implied benefit on wound care which was the recommendation for  hyperbaric oxygen therapy. I have had a lengthy discussion with the patient and her husband regarding her options: 1. Continue with collagen and wound VAC for the primary dressing and offloading and all supportive care. 2. See Dr. Iran Planas for possible placement of Acell or Integra in the OR. 3. get a second opinion from a wound care center and surrounding regions/counties 05/07/2016 -- Note from Dr. Celedonio Miyamoto, who noted that the patient has declined flap surgery. She has discussed application of A cell, and try a few applications to see how the wound progresses. She is also recommended that we could apply products here in the wound center, like Oasis. during her preop workup it was found that her hemoglobin A1c was 11% and she has now been diagnosed as having diabetes mellitus and has been put on appropriate treatment by her PCP 05/28/2016 -- tells me her blood sugars have been doing well and she has an appointment to see her PCP in the next couple of weeks to check her hemoglobin A1c. Other than that she continues to do well. 06/25/2016 -- have not seen her back for the last month but she says her health has been about the same and she has an appointment to check the A1c next week 09/10/16 ---- was seen by Dr. Celedonio Miyamoto -- who applied Acell and saw her back in follow-up. She has recommended silver alginate to the wound every other day and cover with foam. If no significant drainage could transition to collagen every other day. She recommended discontinuing wound VAC. There were no plans to repeat application of Acell. The patient expressed that her husband could do the wound care as going to the Wound Ctr., would cost several $100 for each visit. 10/21/2016 -- her insurance company is getting her new mattress and she is pleased about that. Other than that she has been doing dressings with PolyMem Silver and has been doing very well 02/18/2017 -- she has gone through several changes of  her mattress and has not been pleased with any of them. The ventricles are still working on trying to fit her with the appropriate low air-loss mattress. She has a new wound on the gluteal area which is clearly separated from the original wound. 03/25/17-she is here in follow-up evaluation for her left ischialpressure ulcer. She remains unsatisfied with her pressure mattress. She admits to sitting multiple hours a day, in the bed. We have discussed offloading options. The wound does not appear infected. Nutrition does not appear to be a concern. Will follow-up in 4 weeks,  if wound continues to be stalled may consider x-ray to evaluate for refractory osteomyelitis. 04/21/17; this is a patient that I don't know all that well. She has a chronic wound which at one point had underlying osteomyelitis in the left ischial tuberosity. This is a stage IV pressure ulcer. Over the last 3 months she has a stage II wound inferiorly to the original wound. The last time she was here her dressing was changed to silver collagen although the patient's husband who changes the dressing said that the collagen stuck to the wound and remove skin from the superficial area therefore he switched back to [place] 05/13/17; this is a patient we've been following for a left ischial tuberosity wound which was stage IV at one point had underlying osteomyelitis. Over the last several months she's had a stage II wound just inferior and medial to the related to the wound. According to her husband he is using Endoform layer with collagen although this is not what I had last time. According to her husband they are using [place]gie [place] with collagen although I don't quite know how that started. She was hospitalized from 1/20 through 04/30/16. This was related to a UTI. Her blood cultures were negative, urine culture showed multiple species. She did have a CT scan of the abdomen and pelvis which documented chronic osteomyelitis in the area of  the wound inflammatory markers were unremarkable. She has had prior knowledge of osteomyelitis. It looks as though she received IV antibiotics in 2017 and was treated with a course of hyperbaric oxygen. 05/28/17; the wound over the left ischial tuberosity is deeper today and abuts clearly on bone. Nursing intake reported drainage. I therefore culture of the wound. The more superficial area just below this looks about the same. They once again complained that there are mattress cover is not working although apparently [facility] is been noted to see this many times in the report is that the device is functional 06/18/17; the patient had a probing area on the left ischial tuberosity that was draining purulent fluid last time. This also clearly seemed to have open bone. Culture I did showed pansensitive pseudomonas including third generation cephalosporins. I treated this with cefdinir 300 twice a day for 10 days and things seem to have improved. She has a more superficial wound just underneath this area. Amazingly she has a new air mattress through [facility]. I think they gave this to her as a parking give. In any case this now works according to the patient may have something to do with why the areas are looking better. 07/09/17; the patient has a probing area in the left ischial tuberosity that still has some depth. However this is contracted in terms of the wound orifice although the depth is still roughly the same. There is no undermining. ooShe also has the satellite wound which is more superficial. This appears to have a healthy surface we've been using silver collagen 08/06/17; the patient's wound is over the left ischial tuberosity and a satellite lesion just underneath this. The original wound was actually a deep stage 4 wound. We have made good progress in 2 months and there is no longer exposed bone here. 09/03/17; left ischial tuberosity actually appears to be quite healthy. I  think we are making progress. No debridement is required. There is no surrounding erythema 10/01/17 I follow this patient monthly for her left ischial tuberosity wound. There is 2 areas the original area and a satellite area.  The satellite area looks a lot better there is no surrounding erythema. Her husband relates that he is having trouble maintaining the dressing. This has to do with the soft tissue around it. He states he puts the collagen in but he cannot make sure that it stays in even with the ABD pads and tape that he is been using 10/29/17; patient arrives with a better looking noon today. Some of the satellite lesions have closed. using Prisma 11/26/17; the patient has a large cone-shaped area with the tip of the Cone deep within her buttock soft tissue. The walls of the Cone are epithelialized however the base is still open. The area at the base of this looks moist we've been using silver collagen. Will change to silver alginate 12/31/2017; the wound appears to have come in fairly nicely. Using silver alginate. There is no surrounding maceration or infection 01/28/18; there is still an open area here over the left initial tuberosity. Base of this however looks healthy. There is no surrounding infection 02/25/18; the area of its open is over the left ischial tuberosity. The base of this is where the wound is. This is a large inverted cone-shaped area with the wound at the tip. Dimensions of the wound at the tip are improved. There is a area of denuded skin about halfway towards the tip which her husband thinks may have happened today when he was bathing her. 04/20/17; the area is still open over the left initial tuberosity. 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 This is now completely closed over  although it is closed over with a divot and skin over bone at the base of this. Continued aggressive offloading will be necessary. 12/30/2018 READMISSION This is a 71 year old woman with chronic paraplegia. I picked her up for her care from Dr. Con Memos in this clinic after he departed. She had a stage IV pressure wound over the left ischial tuberosity. She was treated twice for her underlying osteomyelitis and this I believe firstly in 2016 and again in 2017. There were some plans at some point for flap closure of this however she was discovered to have uncontrolled diabetes and I do not think this was ever accomplished. She ultimately healed over in this clinic and was discharged in May. She has a large cone-shaped indentation with the tip of this going towards the left ischial tuberosity. It is not an easy area to examine but at that time I thought all of this was epithelialized. Apparently there was a reopening here shortly after she left the clinic last time. She was admitted to hospital at the end of June for Klebsiella bacteremia felt to be secondary to UTI. A CT scan of the pelvis is listed below and there was initially some concern that she had underlying osteomyelitis although I believe she was seen by infectious disease and that was felt to be not the case: I do not see any new cultures or inflammatory markers IMPRESSION: 1. No CT evidence for acute intra-abdominal or pelvic abnormality. Large volume of stool throughout the colon. 2. Enlarged fatty liver with fat sparing near the gallbladder fossa 3. Cortical scarring right kidney. Bilateral intrarenal stones without hydronephrosis. Thick-walled urinary bladder decompressed by suprapubic catheter 4. Deep left decubitus ulcer with underlying left ischial changes suggesting osteomyelitis. Her  husband has been using silver collagen in the wound. She has not been systemically unwell no fever chills eating and drinking well. They rigorously  offload this wound only getting up in the wheelchair when she is going to appointments the rest of the time she is in bed. 10/8; wound measures larger and she now has exposed bone. We have been using silver alginate 11/12 still using silver alginate. The patient saw Dr. Megan Salon of infectious disease. She was started on Augmentin 500 mg twice daily. She is due to follow-up with Dr. Megan Salon I believe next week. Lab work Dr. Megan Salon requested showed a sedimentation rate of 28 and CRP of 20 although her CRP 1 year ago was 18.8. Sedimentation rate 1 year ago was 11 basic metabolic panel showed a creatinine of 1.12 12/3; the patient followed up with Dr. Megan Salon yesterday. She is still on Augmentin twice daily. This was directed by Dr. Megan Salon. The patient's inflammatory markers have improved which is gratifying. Her C-reactive protein was repeated yesterday and follow-up booked with infectious disease in January. In addition I have been getting secure text messages I think from palliative care [facility] [employer]. I think they were hoping to provide services to the patient in her home. They could not get a hold of the primary physician and so they reached out to me on 2 separate occasions. 12/17; patient last saw Dr. Megan Salon on 12/2. She is finishing up with Augmentin. Her C-reactive protein was 20 on 10/21, 10.1 on 11/19 and 17 on 12/2. The wound itself still has depth and undermining. We are using Santyl with the backing wet-to-dry 04/27/2019. The wound is gradually clearing up in terms of the surface although it is not filled in that much. Still abuts right against bone 2/4; patient with a deep pressure ulcer over the left ischial tuberosity. I thought she was going to follow-up with infectious disease to follow her inflammatory markers although the patient states that they stated that they did not need to see her unless we felt it was necessary. I will need to check their  notes. In any case we ordered moistened silver collagen back with wet-to-dry to fill in the depth of the wound although apparently [facility] sent silver alginate which they have been using since they were here the last time. Is obviously not what we ordered. 2/25. Not much change in this wound it is over the left ischial tuberosity recurrent wound. We have been using silver collagen with backing wet-to-dry. I think the wound is about the same. There is still some tunneling from about 10-12 o'clock over the ischial tuberosity itself 3/11; pressure ulcer over the left ischial tuberosity. Since she was last here the wound VAC was started and apparently going quite well. We are able to get the home health company that accepts [place] healthcare which is in itself sometimes problematic. There is been improvements in the wound the tunneling seems to be better and is contracted nicely 4/8; 1 month follow-up. Since she was last here we have been using silver collagen under a wound VAC. Some minor contraction I think in wound volume. She is cared for diligently by her husband including pressure relief, incontinence management, nutritional support etc. 6/1; this is almost a 59-monthfollow-up. She is been using silver collagen under wound VAC. Circular area over the left ischial tuberosity. She has been using silver collagen under wound VAC 7/8; 1 month follow-up. Silver collagen under the VAC not really a lot  of progress. Tissue at the base of the wound which is right against bone and the tissue next that this does not look completely viable. She is not currently on any antibiotics, she had underlying osteomyelitis I need to look this over 8/16; we are using silver collagen under wound VAC to the left ischial tuberosity wound. Comes in today with absolutely no change in surface area or depth. There is no exposed bone. I did look over her infectious disease notes as I said I would do last time. She last saw Dr.  Megan Salon in December 2020. She completed 6 weeks of Augmentin. This was in response to a bone culture I did showing methicillin susceptible staph aureus and Enterococcus. She was supposed to come back to see Dr. Megan Salon at some point although they say that that appointment was canceled unless I chose to recommend return. I think there was supposed to be follow-up with inflammatory markers but I cannot see that that was ever done. She has not been on antibiotics since 9/21; monthly follow-up. We received a call from home health nurse last evening to report green drainage coming out of the wound. Lab work I ordered last time showed a white count of 5.2 a sedimentation rate of 45 and a C-reactive protein of 25 however neither one of the 2 values are substantially different from her previous values in October 2020 or December 2020. Both are slightly higher but only marginally. Otherwise no new complaints from the patient or her husband 10/19; 1 month follow-up. PCR culture I did last time showed medium quantities of Pseudomonas lower quantities Klebsiella and Enterococcus faecalis group B strep and Peptostreptococcus. I gave her Augmentin for 2 weeks. I am not really sure of my choice of this I would not cover Pseudomonas. She is still having green drainage. Wound itself looks satisfactory there is not a lot of depth wound bed looks healthy 11/16; patient has completed the antibiotics still using gentamicin and silver alginate on the wound. There is improvement in the surface area 12/21; in general the area on the buttock looks somewhat better. Surface looks healthy although I do not know that there is been much improvement in the wound volume. We have been using silver alginate and Hydrofera Blue. Less drainage. In passing the husband showed me an abrasion injury on the left anterior tibia. Covered in necrotic surface. He has noticed this for about a week and has been putting silver collagen on it. He  is completely uncertain about how this happened 1/25; monthly follow-up. The area on the left buttock is about the same. This does not go to bone but a fairly deep wound surface of the wound is of questionable viability. The abrasion injury that they showed me last time apparently was closed out by home health because they thought it was healed but certainly is not although it is just about healed. As a result they haven't been applying anything to this area Finally I did discuss with the patient and her husband the idea of an advanced treatment product to try and get a proper base to this wound I was thinking of Puraply however actually the patient points out that her co-pay for coming to visit [place] i.e. the facility, charge would be unaffordable if they have to, on a weekly basis 2/22; pressure ulcer on the left buttock appears deeper to me and abuts on the ischial tuberosity. I thought initially there was exposed bone but there is a rim of tissue over  this area. She also has a superficial over the right anterior mid tibia. Been using silver collagen to these areas without much success. I have looked over the patient's past history with regards to the area on the left ischium. She did have underlying osteomyelitis here dating back I think to late 2020. She saw Dr. Megan Salon she received a 6-week course of oral antibiotics in response to a bone culture that I did. This does not appear to be infected but it certainly has not been improving in terms of granulation. I do not believe she has had any recent imaging studies 3/29; 1 month follow-up. Pressure ulcer on the left buttock which she has been dealing with with for a number of years. She was treated for underlying osteomyelitis at 1.2 or 3 years ago I think with infectious disease help. She also had I think a flap closure by Dr. Leland Johns and that lasted for about a year and then reopened. I have not been able to get this patient to progress towards  healing although truthfully the wound is absolutely no worse. We have been using Hydrofera Blue 4/26; patient presents for 1 week follow-up. She has been using silver collagen to the area every other day. She has home health that comes out once a week to help with dressing changes as well. The patient is interested in trying a skin substitute over this area. She states she is trying to relieve pressure off of it most of the day. 5/24; patient presents for 1 month follow-up. She has been using silver collagen to the area every other day. She has no complaints today. She is interested in the skin substitute. She tries to leave relieve pressure off Her bottom however is not able to most of the day 6/14; using silver collagen to the area over the left ischial tuberosity. Wound does not appear to be doing particularly well. Open to bone 6/28; the patient patient presented last time with a marked deterioration. Depth probing all the way to bone. The bone itself did not look particularly viable. In spite of this the x-ray I did showed longstanding ulcer over the left ischial tuberosity with chronic bone involvement/reaction that was also seen by CT in 2020. Lab work did not show just active infection with a sed rate of 14 and a C-reactive protein of 7.1. Her comprehensive metabolic panel was normal including an albumin of 4.1 white count was 6 7/19; patient was here 3 weeks ago with a marked deterioration in her wound over the left ischial tuberosity. I ordered a CT scan of the area for 1 reason or another this just did not get done. It is now booked for 8 days from now. She was supposed to come back for bone culture and pathology. That did not happen either. We have been using silver collagen. As usual she is diligently looked after by her husband 8/24; 5-week follow-up. Since the patient was last here the biopsy that I did of her ischial tuberosity came back suggesting osteomyelitis. Culture showed strep.  I started her on Augmentin. She was seen by infectious disease Dr. Lucianne Lei dam and the Augmentin was continued and she is still taking it. CT scan did not suggest anything other than chronic osteomyelitis without much change from her previous study. Her C-reactive protein was only 7 and sedimentation rate was 1.1. We are still using silver collagen to the wound. She has home health. Her husband is very diligent in her care for this reason I  have never pursued a diverting colostomy. She would not agree to this surgery in any case. Finally we have discussed plastic surgery with him in the past and she is not interested in a myocutaneous flap. 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 Since she was last here she was in the ER last week with abdominal pain. She was found to be impacted however in the course of the review there somebody gave her some IV morphine and apparently she developed hives and blisters. There is still tense blisters on her plantar left fifth and fourth fingers 10/4; the patient has completed her Augmentin and is due to follow-up with Dr. Drucilla Schmidt in early November. Her wound today measures about the same but looks a little healthier in terms of granulation there is no exposed bone. I note that she was admitted to hospital for 2 days from 9/21 through 9/23 for delirium. She had received Versed for glaucoma surgery ultimately that was felt to be the etiology. Her blood cultures were negative she had 30,000 colonies of Pseudomonas in her urine. She did receive broad-spectrum antibiotic therapy but ultimately her wound on the buttock was not felt to be the cause. As mentioned she has completed her antibiotics still using silver collagen 11/1; patient has completed her antibiotics for the underlying osteomyelitis. She apparently follows up with Dr. Drucilla Schmidt next week. The wound does not look too bad perhaps slightly narrower in terms of width but the depth is about the same. We have been using  Hydrofera Blue. The patient talks to me about a wound VAC and made it sound as though she was recently on 1 although I do not see this. The other option is an advanced treatment product like Oasis but that means weekly trips into the clinic. She has previously said she does not want an attempt at plastic surgery 11/15; the patient saw Dr. Drucilla Schmidt noted that her follow-up inflammatory markers normalized. She has completed her antibiotics. We are currently using Hydrofera Blue. Wound itself has some surface tissue over bone but certainly not a lot. I have looked back over her history. The patient has had recurrent osteomyelitis in this area as she is received prolonged courses of antibiotics. We did use a wound VAC for a prolonged period of time in 2021. We had some improvement especially in undermining areas but overall not a lot of measurable improvement. Once again I have tried to think about using advanced treatment products in this area something that would require them visiting the clinic very frequently and they did not seem to want to do this. Most recently we ran [place] however she would have a $891 per application co-pay 69/4; left ischial tuberosity. I do not see much difference in 3 weeks. There is no exposed bone. We are using Hydrofera Blue. Our intake nurse reports some "greenish" drainage 12/21 left ischial tuberosity. Measuring slightly smaller in surface area. The wound still has some tissue over bone i.e. there is no exposed bone. No overt infection. We did a deep tissue culture last time for PCR. The major bacteria was Pseudomonas although there were medium titers of Enterococcus faecalis Klebsiella E. coli and Morganella as well as Peptostreptococcus. [facility] antibiotic include streptomycin and vancomycin they got this last weekend and used it twice 04/17/2021; no change in this wound. It does not have undermining however the surface is of it does not look particularly viable. We  have been using topical antibiotic directed at a PCR culture as well  as silver alginate. It is clear in talking to the patient and her husband that she does not offload this area adequately including spending all night on this with I think a level 2 surface on her bed. I have told her that this is not adequate to heal a wound like this.. 2/15; [facility] with a $607 co-pay per application. They are using silver alginate to the wound offloading as best they can according to her husband Objective Constitutional Patient is hypertensive.. Pulse regular and within target range for patient.Marland Kitchen Respirations regular, non-labored and within target range.. Temperature is normal and within the target range for the patient.Marland Kitchen Appears in no distress. Vitals Time Taken: 10:53 AM, Height: 63 in, Weight: 185 lbs, BMI: 32.8, Temperature: 97.6 F, Pulse: 78 bpm, Respiratory Rate: 16 breaths/min, Blood Pressure: 156/75 mmHg. General Notes: Wound exam; left ischial tuberosity. I do not think there is much change from a month ago. The depth is about the same the amount of tissue over bone is really very limited. There is no evidence of infection in the soft tissue. Integumentary (Hair, Skin) Wound #12 status is Open. Original cause of wound was Pressure Injury. The date acquired was: 09/06/2018. The wound has been in treatment 124 weeks. The wound is located on the Left Ischial Tuberosity. The wound measures 2cm length x 1cm width x 1.3cm depth; 1.571cm^2 area and 2.042cm^3 volume. There is Fat Layer (Subcutaneous Tissue) exposed. There is no undermining noted, however, there is tunneling at 6:00 with a maximum distance of 1.4cm. There is a medium amount of serosanguineous drainage noted. The wound margin is well defined and not attached to the wound base. There is large (67-100%) red, pink granulation within the wound bed. There is a small (1-33%) amount of necrotic tissue within the wound bed including Adherent Slough. Wound  #14 status is Open. Original cause of wound was Pressure Injury. The date acquired was: 04/17/2021. The wound has been in treatment 5 weeks. The wound is located on the Lateral Ischial Tuberosity. The wound measures 1.5cm length x 1cm width x 0.1cm depth; 1.178cm^2 area and 0.118cm^3 volume. There is Fat Layer (Subcutaneous Tissue) exposed. There is no tunneling or undermining noted. There is a medium amount of serosanguineous drainage noted. The wound margin is distinct with the outline attached to the wound base. There is medium (34-66%) red, pink granulation within the wound bed. There is a medium (34-66%) amount of necrotic tissue within the wound bed including Adherent Slough. 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 - Dr. Dellia Nims room 1 Bathing/ Shower/ Hygiene: May shower and wash wound with soap and water. - with dressing changes Edema Control - Lymphedema / SCD / Other: Elevate legs to the level of the heart or above for 30 minutes daily and/or when sitting, a frequency of: - throughout the day Off-Loading: Turn and reposition every 2 hours - *** Try to shift from side to side and shift to belly (if tolerated)**** Home Health: New wound care orders this week; continue Home Health for wound care. May utilize formulary equivalent dressing for wound treatment orders unless otherwise specified. - change primary dressing to prisma Other Home Health Orders/Instructions: - [facility] WOUND #12: - Ischial Tuberosity Wound Laterality: Left Cleanser: Soap and Water (Home Health) 3 x Per Week/30 Days Discharge Instructions: May shower and wash wound with dial antibacterial soap and water prior to  dressing change. Cleanser: Wound Cleanser (Home Health) 3 x Per Week/30 Days Discharge Instructions: Cleanse the wound with wound cleanser prior to applying a  clean dressing using gauze sponges, not tissue or cotton balls. Peri-Wound Care: Skin Prep (Home Health) 3 x Per Week/30 Days Discharge Instructions: Use skin prep as directed Prim Dressing: Promogran Prisma Matrix, 4.34 (sq in) (silver collagen) 3 x Per Week/30 Days ary Discharge Instructions: Moisten collagen with saline or hydrogel Secondary Dressing: Woven Gauze Sponges 2x2 in 3 x Per Week/30 Days Discharge Instructions: moisten with saline and pack lightly over collagen Secondary Dressing: ComfortFoam Border, 4x4 in (silicone border) ([place]) 3 x Per Week/30 Days Discharge Instructions: Apply over primary dressing as directed. WOUND #14: - Ischial Tuberosity Wound Laterality: Lateral Prim Dressing: Promogran Prisma Matrix, 4.34 (sq in) (silver collagen) 3 x Per Week/30 Days ary Discharge Instructions: Moisten collagen with saline or hydrogel Secondary Dressing: Zetuvit Plus Silicone Border Dressing 4x4 (in/in) 3 x Per Week/30 Days Discharge Instructions: Apply silicone border over primary dressing as directed. 1. I have changed her from silver alginate to silver collagen to see if we can get better results from granulation although I really doubt this. 2. I do not think they are able to offload this sufficiently. The second unfortunate thing is that there is a large divot in this area from previous wounds I do not think it would be possible to put a wound VAC on this area 3. I have given her many options before including advanced treatment products with the co-pay and at least a weekly visit, consideration of plastic surgery referral she did not bring this up today and I did not discuss it again. Electronic Signature(s) Signed: 05/22/2021 4:36:32 PM By: Linton Ham MD Entered By: Linton Ham on 05/22/2021 11:37:40 -------------------------------------------------------------------------------- SuperBill Details Patient Name: Date of Service: [name]  05/22/2021 Medical Record Number: [id number] Patient Account Number: [id number] Date of Birth/Sex: Treating RN: 12-31-1950 (70 y.o. [name]: [name] Other Clinician: Referring Provider: Treating Provider/Extender: Tommy Rainwater in Treatment: 124 Diagnosis Coding ICD-10 Codes Code Description [phone] Pressure ulcer of left buttock, stage 4 E11.622 Type 2 diabetes mellitus with other skin ulcer Facility Procedures The patient participates with Medicare or their insurance follows the Medicare Facility Guidelines: CPT4 Code Description Modifier Quantity [id number] [place] VISIT-LEV 3 EST PT 1 Physician Procedures : CPT4 Code Description Modifier [id number] - WC PHYS LEVEL 3 - EST PT ICD-10 Diagnosis Description 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, stage 4 Quantity: 1 Electronic Signature(s) Signed: 05/22/2021 4:36:32 PM By: Linton Ham MD Entered By: Linton Ham on 05/22/2021 11:37:56

## 2021-06-19 ENCOUNTER — Other Ambulatory Visit: Payer: Self-pay

## 2021-06-19 ENCOUNTER — Encounter (HOSPITAL_BASED_OUTPATIENT_CLINIC_OR_DEPARTMENT_OTHER): Payer: Medicare Other | Attending: Internal Medicine | Admitting: General Surgery

## 2021-06-19 DIAGNOSIS — E119 Type 2 diabetes mellitus without complications: Secondary | ICD-10-CM | POA: Diagnosis not present

## 2021-06-19 DIAGNOSIS — Z87891 Personal history of nicotine dependence: Secondary | ICD-10-CM | POA: Diagnosis not present

## 2021-06-19 DIAGNOSIS — G8220 Paraplegia, unspecified: Secondary | ICD-10-CM | POA: Insufficient documentation

## 2021-06-19 DIAGNOSIS — L89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, stage 4: Secondary | ICD-10-CM | POA: Diagnosis present

## 2021-06-19 DIAGNOSIS — Z853 Personal history of malignant neoplasm of breast: Secondary | ICD-10-CM | POA: Diagnosis not present

## 2021-06-19 NOTE — Progress Notes (Signed)
NEELIE, WELSHANS. ([id number]) ?Visit Report for 06/19/2021 ?Chief Complaint Document Details ?Patient Name: Date of Service: ?[name], [name] M. 06/19/2021 10:15 A M ?Medical Record Number: [id number] ?Patient Account Number: [id number] ?Date of Birth/Sex: Treating RN: ?Aug 20, 1950 (71 y.o. F) ?Primary Care Provider: [name] Other Clinician: ?Referring Provider: ?Treating Provider/Extender: Fredirick Maudlin ?[name] ?Weeks in Treatment: 128 ?Information Obtained from: Patient ?Chief Complaint ?[name] here for follow up evaluation of left ischial pressure ulcer ?12/30/2018; patient comes back for review of wounds in the same general area over the previously healed left ischial pressure ulcer ?Electronic Signature(s) ?Signed: 06/19/2021 12:42:49 PM By: Fredirick Maudlin MD FACS ?Entered By: Fredirick Maudlin on 06/19/2021 12:42:49 ?-------------------------------------------------------------------------------- ?Debridement Details ?Patient Name: Date of Service: ?[name], [name] M. 06/19/2021 10:15 A M ?Medical Record Number: [id number] ?Patient Account Number: [id number] ?Date of Birth/Sex: Treating RN: ?[birth year] (71 y.o. Benjamine Sprague, Shat[place] ?Primary Care Provider: [name] Other Clinician: ?Referring Provider: ?Treating Provider/Extender: Fredirick Maudlin ?[name] ?Weeks in Treatment: 128 ?Debridement Performed for Assessment: Wound #12 Left Ischial Tuberosity ?Performed By: Physician Fredirick Maudlin, MD ?Debridement Type: Debridement ?Level of Consciousness (Pre-procedure): Awake and Alert ?Pre-procedure Verification/Time Out Yes - 11:03 ?Taken: ?Start Time: 11:03 ?T Area Debrided (L x W): ?otal 2.7 (cm) x 3.4 (cm) = 9.18 (cm?) ?Tissue and other material debrided: Non-Viable, [place], [place], [place] ?Level: Non-Viable Tissue ?Debridement Description: Selective/Open Wound ?Instrument: Curette ?Bleeding: Minimum ?Hemostasis Achieved: Pressure ?End Time: 11:05 ?Procedural Pain: 0 ?Post Procedural Pain: 0 ?Response to  Treatment: Procedure was tolerated well ?Level of Consciousness (Post- Awake and Alert ?procedure): ?Post Debridement Measurements of Total Wound ?Length: (cm) 2.7 ?Stage: Category/Stage IV ?Width: (cm) 3.4 ?Depth: (cm) 1.2 ?Volume: (cm?) 8.652 ?Character of Wound/Ulcer Post Debridement: Improved ?Post Procedure Diagnosis ?Same as Pre-procedure ?Electronic Signature(s) ?Signed: 06/19/2021 6:02:41 PM By: Levan Hurst RN, BSN ?Signed: 06/19/2021 6:42:44 PM By: Fredirick Maudlin MD FACS ?Entered By: Levan Hurst on 06/19/2021 11:04:08 ?-------------------------------------------------------------------------------- ?HPI Details ?Patient Name: Date of Service: ?[name], [name] M. 06/19/2021 10:15 A M ?Medical Record Number: [id number] ?Patient Account Number: [id number] ?Date of Birth/Sex: Treating RN: ?Oct 15, 1950 (71 y.o. F) ?Primary Care Provider: [name] Other Clinician: ?Referring Provider: ?Treating Provider/Extender: Fredirick Maudlin ?[name] ?Weeks in Treatment: 128 ?History of Present Illness ?Location: open ulceration of the left gluteal area, left heel and right ankle for about 5 months. ?Quality: Patient reports No Pain. ?Severity: Patient states wound(s) are getting worse. ?Duration: Patient has had the wound for > 5 months prior to seeking treatment at the wound center ?Context: The wound occurred when the patient has been paraplegic for about 3 years. ?Modifying Factors: Wound improving due to current treatment. ?ssociated Signs and Symptoms: Patient reports having foul odor. ?A ?HPI Description: this 71 year old patient who [name] known to have hypertension, hypothyroidism, breast cancer, chronic pain syndrome, paraplegia was noted to ?have a left gluteal decubitus ulcer and was brought into the hospital. During the course of her hospitalization she was debrided in the operating room by ankle ?wound. Bone cultures were taken at that time but were negative but clinically she was treated for osteomyelitis  because of the probing down to bone and open ?exposed bone. Home health has been giving her antibioticss which include vancomycin and Zosyn. ?The patient was a smoker until about 3 weeks ago and used to smoke about 10 cigarettes a day for a long while. ?12/13/2014 - details of her operative note from 11/03/2014 were reviewed -- PROCEDURE: 1. Excisional debridement skin, subcutaneous,  muscle left ?ischium 35 cm2 2. Excisional debridement skin, subcutaneous tissue left heel 27 cm2 3. Excisional debridement right ankle skin, subcutaneous, bone 30 cm2 ?01/24/2015 -- she has some issues with her wheelchair cushion but other than that [name] doing very well and has received Podus boots for her feet. ?02/14/2015 -- she was using her old offloading boots and this seemed to have caused her a new pressure ulcer on the left posterior heel near the superior part ?just below the Achilles tendon. ?03/07/2015 -- she has a new ulceration just to the left of the midline on her sacral region more on the left buttock and this has been there for Dr. Leland Johns and ?had all the wounds sharply debrided. The debridement was done for the left ischial wound, the left heel wound and the right about a week. ?08/22/2015 -- was recently admitted to hospital between May 5 and 08/13/2015, with sepsis and leukocytosis due to a UTI. she was treated for a sepsis ?complicating Escherichia coli UTI and kidney stones. She also had metabolic and careful up at the secondary to pyelonephritis. He received broad-spectrum ?antibiotics initially and then received Macrobid as per urology. She was sent home on nitrofurantoin. during her admission she had a CT scan which showed ?exposed left ischial tuberosity without evidence of osteolysis. ?09/12/2015-- the patient [name] having some issues with her air mattress and would like to get a opinion from medical modalities. ?10/10/2015 -- the issue with her air mattress has not yet been sorted out and the new problem seems  to be a lot of odor from the wound VAC. ?11/27/2015 -- the patient was admitted to the hospital between July 23 and 10/31/2015. Her problems were sepsis, osteomyelitis of the pelvic bone and acute ?pyelonephritis. CT of the abdomen and pelvis was consistent with a left-sided pyelonephritis with hydronephrosis and also just showed new sclerosis of the ?posterior portion of the left anterior pubic ramus suggestive of periosteal reaction consistent with osteomyelitis. She was treated for the osteomyelitis with ?infectious disease consult recommending 6 weeks of IV antibiotics including vancomycin and Rocephin and the antibiotics were to go on until 12/10/2015. He ?was seen by Dr. Iran Planas plastic surgery and Dr. Linus Salmons of infectious disease. She had a suprapubic catheter placed during the admission. ?CT scan done on 10/28/2015 showed specifically -- New sclerosis of the posterior portion of the left inferior pubic ramus with aggressive periosteal reaction, ?consistent with osteomyelitis, with adjacent soft tissue gas compatible with previously described decubitus ulcer. ?12/12/2015 -- she was recently seen by Dr. Linus Salmons, who noted good improvement and CRP and ESR compared to before and he has stopped her antibiotic as ?per plans to finish on September 4. The patient was encouraged to continue with wound care and consider hyperbaric oxygen therapy. ?Today she tells me that she has consented to undergo hyperbaric oxygen therapy and we can start the paperwork. ?01/02/2016 -- her PCP had gained about 3 years but she still persists in having problems during hyperbaric oxygen therapy with some discomfort in the ears. ?01/09/16; pressure area with underlying osteomyelitis in the left buttock. Wound bed itself has some slight amount of grayish surface slough however I do not ?think any debridement was necessary. There [name] no exposed bone soft tissue appears stable. [name] using a wound VAC ?01/16/16; back for weekly wound review  in conjunction with HBO. She has a deep wound over the left initial tuberosity previously treated with 6 weeks of IV ?antibiotics for osteomyelitis. Wound bed looks reasonably  healthy although the base of t

## 2021-06-19 NOTE — Progress Notes (Addendum)
TAITUM, MENTON. ([id number]) ?Visit Report for 06/19/2021 ?Allergy List Details ?Patient Name: Date of Service: ?CHA V IS, Katlyne M. 06/19/2021 10:15 A M ?Medical Record Number: [id number] ?Patient Account Number: [id number] ?Date of Birth/Sex: Treating RN: ?March 20, 1951 (71 y.o. Benjamine Sprague, [place] ?Primary Care Vasili Fok: [name] Other Clinician: ?Referring Najae Filsaime: ?Treating Kinga Cassar/Extender: Fredirick Maudlin ?[name] ?Weeks in Treatment: 128 ?Allergies ?Active Allergies ?Iodinated Contrast Media - IV Dye ?Reaction: hot and sweaty ?Severity: Severe ?Active: 11/22/2014 ?aspirin ?Reaction: hives ?Severity: Severe ?Active: 11/22/2014 ?Sulfa (Sulfonamide Antibiotics) ?Reaction: hives ?Severity: Severe ?Active: 11/22/2014 ?Allergy Notes ?Electronic Signature(s) ?Signed: 06/24/2021 6:09:44 PM By: Levan Hurst RN, BSN ?Entered By: Levan Hurst on 06/24/2021 09:10:55 ?-------------------------------------------------------------------------------- ?Arrival Information Details ?Patient Name: Date of Service: ?CHA V IS, Lanyla M. 06/19/2021 10:15 A M ?Medical Record Number: [id number] ?Patient Account Number: [id number] ?Date of Birth/Sex: Treating RN: ?Jun 16, 1950 (71 y.o. F) Sharyn Creamer ?Primary Care Brysyn Brandenberger: [name] Other Clinician: ?Referring Jairy Angulo: ?Treating Nicandro Perrault/Extender: Fredirick Maudlin ?[name] ?Weeks in Treatment: 128 ?Visit Information ?Patient Arrived: Wheel Chair ?Arrival Time: 10:22 ?Accompanied By: Husband ?Transfer Assistance: Manual ?Patient Requires Transmission-Based Precautions: No ?Patient Has Alerts: No ?Electronic Signature(s) ?Signed: 06/19/2021 5:01:39 PM By: Sharyn Creamer RN, BSN ?Entered By: Sharyn Creamer o ?History Since Last Visit ?Added or deleted any medications: No ?Any new allergies or adverse reactions: No ?Had a fall or experienced change in activities of daily living that may affect risk of falls: No ?Signs or symptoms of abuse/neglect since last visito No ?Hospitalized since  last visit: No ?Has Dressing in Place as Prescribed: Yes ?Pain Present Now: No ?n 06/19/2021 10:23:25 ?-------------------------------------------------------------------------------- ?Encounter Discharge Information Details ?Patient Name: ?Date of Service: ?CHA V IS, Aleera M. 06/19/2021 10:15 A M ?Medical Record Number: [id number] ?Patient Account Number: [id number] ?Date of Birth/Sex: ?Treating RN: ?1951-02-19 (70 y.o. Benjamine Sprague, [place] ?Primary Care Raea Magallon: [name] ?Other Clinician: ?Referring Layn Kye: ?Treating Cale Decarolis/Extender: Fredirick Maudlin ?[name] ?Weeks in Treatment: 128 ?Encounter Discharge Information Items Post Procedure Vitals ?Discharge Condition: Stable ?Temperature (F): 97.6 ?Ambulatory Status: Wheelchair ?Pulse (bpm): 81 ?Discharge Destination: Home ?Respiratory Rate (breaths/min): 18 ?Transportation: Private Auto ?Blood Pressure (mmHg): 97/61 ?Accompanied By: husband ?Schedule Follow-up Appointment: Yes ?Clinical Summary of Care: Patient Declined ?Electronic Signature(s) ?Signed: 06/19/2021 6:02:41 PM By: Levan Hurst RN, BSN ?Entered By: Levan Hurst on 06/19/2021 14:06:52 ?-------------------------------------------------------------------------------- ?Lower Extremity Assessment Details ?Patient Name: ?Date of Service: ?CHA V IS, Amadea M. 06/19/2021 10:15 A M ?Medical Record Number: [id number] ?Patient Account Number: [id number] ?Date of Birth/Sex: ?Treating RN: ?11-17-1950 (71 y.o. F) Sharyn Creamer ?Primary Care Augustus Zurawski: [name] ?Other Clinician: ?Referring Monicka Cyran: ?Treating Bo Teicher/Extender: Fredirick Maudlin ?[name] ?Weeks in Treatment: 128 ?Electronic Signature(s) ?Signed: 06/19/2021 5:01:39 PM By: Sharyn Creamer RN, BSN ?Entered By: Sharyn Creamer on 06/19/2021 10:24:42 ?-------------------------------------------------------------------------------- ?Multi Wound Chart Details ?Patient Name: ?Date of Service: ?CHA V IS, Syvanna M. 06/19/2021 10:15 A M ?Medical Record Number:  [id number] ?Patient Account Number: [id number] ?Date of Birth/Sex: ?Treating RN: ?03-17-1951 (71 y.o. F) ?Primary Care Saramarie Stinger: [name] ?Other Clinician: ?Referring Maryanna Stuber: ?Treating Samara Stankowski/Extender: Fredirick Maudlin ?[name] ?Weeks in Treatment: 128 ?Vital Signs ?Height(in): 63 ?Pulse(bpm): 81 ?Weight(lbs): 185 ?Blood Pressure(mmHg): 97/61 ?Body Mass Index(BMI): 32.8 ?Temperature(??F): 97.6 ?Respiratory Rate(breaths/min): 18 ?Photos: [14:No Photos] [N/A:N/A] ?Left Ischial Tuberosity Lateral Ischial Tuberosity N/A ?Wound Location: ?Pressure Injury Pressure Injury N/A ?Wounding Event: ?Pressure Ulcer Pressure Ulcer N/A ?Primary Etiology: ?Anemia, Hypertension, Type II N/A N/A ?Comorbid History: ?Diabetes, Osteoarthritis, Dementia, ?Paraplegia, Received Radiation ?09/06/2018 04/17/2021 N/A ?Date Acquired: ?73 9 N/A ?Weeks of Treatment: ?  Open Converted N/A ?Wound Status: ?No No N/A ?Wound Recurrence: ?2.7x3.4x1.2 0x0x0 N/A ?Measurements L x W x D (cm) ?7.21 0 N/A ?A (cm?) : ?rea ?8.652 0 N/A ?Volume (cm?) : ?-2955.10% 100.00% N/A ?% Reduction in A rea: ?-3981.10% 100.00% N/A ?% Reduction in Volume: ?Category/Stage IV Category/Stage II N/A ?Classification: ?Medium Medium N/A ?Exudate A mount: ?Purulent Serosanguineous N/A ?Exudate Type: ?yellow, brown, green red, brown N/A ?Exudate Color: ?Yes N/A N/A ?Foul Odor A Cleansing: ?fter ?No N/A N/A ?Odor A nticipated Due to Product ?Use: ?Well defined, not attached N/A N/A ?Wound Margin: ?Large (67-100%) N/A N/A ?Granulation A mount: ?Red, Pink N/A N/A ?Granulation Quality: ?Small (1-33%) N/A N/A ?Necrotic A mount: ?Fat Layer (Subcutaneous Tissue): Yes N/A N/A ?Exposed Structures: ?Fascia: No ?Tendon: No ?Muscle: No ?Joint: No ?Bone: No ?None N/A N/A ?Epithelialization: ?Debridement - Selective/Open Wound N/A N/A ?Debridement: ?Pre-procedure Verification/Time Out 11:03 N/A N/A ?Taken: ?Art therapist, Slough N/A N/A ?Tissue Debrided: ?Non-Viable Tissue N/A  N/A ?Level: ?9.18 N/A N/A ?Debridement A (sq cm): ?rea ?Curette N/A N/A ?Instrument: ?Minimum N/A N/A ?Bleeding: ?Pressure N/A N/A ?Hemostasis A chieved: ?0 N/A N/A ?Procedural Pain: ?0 N/A N/A ?Post Procedural Pain: ?Procedure was tolerated well N/A N/A ?Debridement Treatment Response: ?2.7x3.4x1.2 N/A N/A ?Post Debridement Measurements L x ?W x D (cm) ?8.652 N/A N/A ?Post Debridement Volume: (cm?) ?Category/Stage IV N/A N/A ?Post Debridement Stage: ?Debridement N/A N/A ?Procedures Performed: ?Treatment Notes ?Electronic Signature(s) ?Signed: 06/19/2021 12:42:37 PM By: Fredirick Maudlin MD FACS ?Entered By: Fredirick Maudlin on 06/19/2021 12:42:37 ?-------------------------------------------------------------------------------- ?Multi-Disciplinary Care Plan Details ?Patient Name: ?Date of Service: ?CHA V IS, Henley M. 06/19/2021 10:15 A M ?Medical Record Number: [id number] ?Patient Account Number: [id number] ?Date of Birth/Sex: ?Treating RN: ?10-10-1950 (71 y.o. Benjamine Sprague, [place] ?Primary Care Leaann Nevils: [name] ?Other Clinician: ?Referring Payeton Germani: ?Treating Daemyn Gariepy/Extender: Fredirick Maudlin ?[name] ?Weeks in Treatment: 128 ?Multidisciplinary Care Plan reviewed with physician ?Active Inactive ?Pressure ?Nursing Diagnoses: ?Knowledge deficit related to management of pressures ulcers ?Potential for impaired tissue integrity related to pressure, friction, moisture, and shear ?Goals: ?Patient/caregiver will verbalize understanding of pressure ulcer management ?Date Initiated: 05/22/2021 ?Target Resolution Date: 07/19/2021 ?Goal Status: Active ?Interventions: ?Assess: immobility, friction, shearing, incontinence upon admission and as needed ?Assess offloading mechanisms upon admission and as needed ?Notes: ?Wound/Skin Impairment ?Nursing Diagnoses: ?Knowledge deficit related to ulceration/compromised skin integrity ?Goals: ?Patient/caregiver will verbalize understanding of skin care regimen ?Date Initiated:  12/30/2018 ?Target Resolution Date: 07/19/2021 ?Goal Status: Active ?Interventions: ?Assess patient/caregiver ability to obtain necessary supplies ?Assess patient/caregiver ability to perform ulcer/skin care regimen upon admission

## 2021-07-17 ENCOUNTER — Encounter (HOSPITAL_BASED_OUTPATIENT_CLINIC_OR_DEPARTMENT_OTHER): Payer: Medicare Other | Attending: General Surgery | Admitting: General Surgery

## 2021-07-17 DIAGNOSIS — Z87891 Personal history of nicotine dependence: Secondary | ICD-10-CM | POA: Diagnosis not present

## 2021-07-17 DIAGNOSIS — E11622 Type 2 diabetes mellitus with other skin ulcer: Secondary | ICD-10-CM | POA: Diagnosis not present

## 2021-07-17 DIAGNOSIS — L89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, stage 4: Secondary | ICD-10-CM | POA: Insufficient documentation

## 2021-07-17 DIAGNOSIS — G8220 Paraplegia, unspecified: Secondary | ICD-10-CM | POA: Insufficient documentation

## 2021-07-17 DIAGNOSIS — E039 Hypothyroidism, unspecified: Secondary | ICD-10-CM | POA: Insufficient documentation

## 2021-07-17 DIAGNOSIS — L89310 Pressure ulcer of right buttock, unstageable: Secondary | ICD-10-CM | POA: Diagnosis not present

## 2021-07-17 DIAGNOSIS — Z853 Personal history of malignant neoplasm of breast: Secondary | ICD-10-CM | POA: Diagnosis not present

## 2021-07-17 DIAGNOSIS — G894 Chronic pain syndrome: Secondary | ICD-10-CM | POA: Diagnosis not present

## 2021-07-17 DIAGNOSIS — I10 Essential (primary) hypertension: Secondary | ICD-10-CM | POA: Insufficient documentation

## 2021-07-17 NOTE — Progress Notes (Signed)
[name], [name]. ([id number]) ?Visit Report for 07/17/2021 ?Chief Complaint Document Details ?Patient Name: Date of Service: ?[name], [name] M. 07/17/2021 10:30 A M ?Medical Record Number: [id number] ?Patient Account Number: [id number] ?Date of Birth/Sex: Treating RN: ?1950/12/14 (71 y.o. F) ?Primary Care Provider: Sandi [name] Other Clinician: ?Referring Provider: ?Treating Provider/Extender: Fredirick Maudlin ?[name] ?Weeks in Treatment: 132 ?Information Obtained from: Patient ?Chief Complaint ?[name] here for follow up evaluation of left ischial pressure ulcer ?12/30/2018; patient comes back for review of wounds in the same general area over the previously healed left ischial pressure ulcer ?Electronic Signature(s) ?Signed: 07/17/2021 11:40:17 AM By: Fredirick Maudlin MD FACS ?Entered By: Fredirick Maudlin on 07/17/2021 11:40:17 ?-------------------------------------------------------------------------------- ?Debridement Details ?Patient Name: Date of Service: ?[name], [name] M. 07/17/2021 10:30 A M ?Medical Record Number: [id number] ?Patient Account Number: [id number] ?Date of Birth/Sex: Treating RN: ?Apr 01, 1951 (71 y.o. F) [place]ton, Mechele Claude ?Primary Care Provider: Sandi [name] Other Clinician: ?Referring Provider: ?Treating Provider/Extender: Fredirick Maudlin ?[name] ?Weeks in Treatment: 132 ?Debridement Performed for Assessment: Wound #12 Left Ischial Tuberosity ?Performed By: Physician Fredirick Maudlin, MD ?Debridement Type: Debridement ?Level of Consciousness (Pre-procedure): Awake and Alert ?Pre-procedure Verification/Time Out Yes - 11:12 ?Taken: ?Start Time: 11:12 ?Pain Control: ?Other : benzocaine 20% spray ?T Area Debrided (L x W): ?otal 2.5 (cm) x 2 (cm) = 5 (cm?) ?Tissue and other material debrided: ?Non-Viable, Slough, Subcutaneous, Skin: Dermis , Slough ?Level: Skin/Subcutaneous Tissue ?Debridement Description: Excisional ?Instrument: Curette ?Bleeding: Minimum ?Hemostasis Achieved: Pressure ?End Time:  11:13 ?Procedural Pain: 0 ?Post Procedural Pain: 0 ?Response to Treatment: Procedure was tolerated well ?Level of Consciousness (Post- Awake and Alert ?procedure): ?Post Debridement Measurements of Total Wound ?Length: (cm) 2.5 ?Stage: Category/Stage IV ?Width: (cm) 2 ?Depth: (cm) 1.3 ?Volume: (cm?) 5.105 ?Character of Wound/Ulcer Post Debridement: Improved ?Post Procedure Diagnosis ?Same as Pre-procedure ?Electronic Signature(s) ?Signed: 07/17/2021 11:57:49 AM By: Dellie Catholic RN ?Signed: 07/17/2021 2:42:57 PM By: Fredirick Maudlin MD FACS ?Entered By: Dellie Catholic on 07/17/2021 11:15:27 ?-------------------------------------------------------------------------------- ?HPI Details ?Patient Name: Date of Service: ?[name], [name] M. 07/17/2021 10:30 A M ?Medical Record Number: [id number] ?Patient Account Number: [id number] ?Date of Birth/Sex: Treating RN: ?[birth year] (71 y.o. F) ?Primary Care Provider: Sandi [name] Other Clinician: ?Referring Provider: ?Treating Provider/Extender: Fredirick Maudlin ?[name] ?Weeks in Treatment: 132 ?History of Present Illness ?Location: open ulceration of the left gluteal area, left heel and right ankle for about 5 months. ?Quality: Patient reports No Pain. ?Severity: Patient states wound(s) are getting worse. ?Duration: Patient has had the wound for > 5 months prior to seeking treatment at the wound center ?Context: The wound occurred when the patient has been paraplegic for about 3 years. ?Modifying Factors: Wound improving due to current treatment. ?ssociated Signs and Symptoms: Patient reports having foul odor. ?A ?HPI Description: this 71 year old patient who [name] known to have hypertension, hypothyroidism, breast cancer, chronic pain syndrome, paraplegia was noted to ?have a left gluteal decubitus ulcer and was brought into the hospital. During the course of her hospitalization she was debrided in the operating room by ankle ?wound. Bone cultures were taken at that time but were  negative but clinically she was treated for osteomyelitis because of the probing down to bone and open ?exposed bone. Home health has been giving her antibioticss which include vancomycin and Zosyn. ?The patient was a smoker until about 3 weeks ago and used to smoke about 10 cigarettes a day for a long while. ?12/13/2014 - details of her operative note from 11/03/2014 were  reviewed -- PROCEDURE: 1. Excisional debridement skin, subcutaneous, muscle left ?ischium 35 cm2 2. Excisional debridement skin, subcutaneous tissue left heel 27 cm2 3. Excisional debridement right ankle skin, subcutaneous, bone 30 cm2 ?01/24/2015 -- she has some issues with her wheelchair cushion but other than that [name] doing very well and has received Podus boots for her feet. ?02/14/2015 -- she was using her old offloading boots and this seemed to have caused her a new pressure ulcer on the left posterior heel near the superior part ?just below the Achilles tendon. ?03/07/2015 -- she has a new ulceration just to the left of the midline on her sacral region more on the left buttock and this has been there for Dr. Leland Johns and ?had all the wounds sharply debrided. The debridement was done for the left ischial wound, the left heel wound and the right about a week. ?08/22/2015 -- was recently admitted to hospital between May 5 and 08/13/2015, with sepsis and leukocytosis due to a UTI. she was treated for a sepsis ?complicating Escherichia coli UTI and kidney stones. She also had metabolic and careful up at the secondary to pyelonephritis. He received broad-spectrum ?antibiotics initially and then received Macrobid as per urology. She was sent home on nitrofurantoin. during her admission she had a CT scan which showed ?exposed left ischial tuberosity without evidence of osteolysis. ?09/12/2015-- the patient [name] having some issues with her air mattress and would like to get a opinion from medical modalities. ?10/10/2015 -- the issue with her air  mattress has not yet been sorted out and the new problem seems to be a lot of odor from the wound VAC. ?11/27/2015 -- the patient was admitted to the hospital between July 23 and 10/31/2015. Her problems were sepsis, osteomyelitis of the pelvic bone and acute ?pyelonephritis. CT of the abdomen and pelvis was consistent with a left-sided pyelonephritis with hydronephrosis and also just showed new sclerosis of the ?posterior portion of the left anterior pubic ramus suggestive of periosteal reaction consistent with osteomyelitis. She was treated for the osteomyelitis with ?infectious disease consult recommending 6 weeks of IV antibiotics including vancomycin and Rocephin and the antibiotics were to go on until 12/10/2015. He ?was seen by Dr. Iran Planas plastic surgery and Dr. Linus Salmons of infectious disease. She had a suprapubic catheter placed during the admission. ?CT scan done on 10/28/2015 showed specifically -- New sclerosis of the posterior portion of the left inferior pubic ramus with aggressive periosteal reaction, ?consistent with osteomyelitis, with adjacent soft tissue gas compatible with previously described decubitus ulcer. ?12/12/2015 -- she was recently seen by Dr. Linus Salmons, who noted good improvement and CRP and ESR compared to before and he has stopped her antibiotic as ?per plans to finish on September 4. The patient was encouraged to continue with wound care and consider hyperbaric oxygen therapy. ?Today she tells me that she has consented to undergo hyperbaric oxygen therapy and we can start the paperwork. ?01/02/2016 -- her PCP had gained about 3 years but she still persists in having problems during hyperbaric oxygen therapy with some discomfort in the ears. ?01/09/16; pressure area with underlying osteomyelitis in the left buttock. Wound bed itself has some slight amount of grayish surface slough however I do not ?think any debridement was necessary. There [name] no exposed bone soft tissue appears stable.  [name] using a wound VAC ?01/16/16; back for weekly wound review in conjunction with HBO. She has a deep wound over the left initial tuberosity previously treated with 6 weeks of  IV ?antibiotics for osteomyelitis. W

## 2021-07-17 NOTE — Progress Notes (Signed)
[name], [name]. ([id number]) ?Visit Report for 07/17/2021 ?Arrival Information Details ?Patient Name: Date of Service: ?[name], [name] M. 07/17/2021 10:30 A M ?Medical Record Number: [id number] ?Patient Account Number: [id number] ?Date of Birth/Sex: Treating RN: ?1951/03/16 (71 y.o. F) ?Primary Care Zachrey Deutscher: [name] Other Clinician: ?Referring Calypso Hagarty: ?Treating Traves Majchrzak/Extender: Fredirick Maudlin ?[name] ?Weeks in Treatment: 132 ?Visit Information History Since Last Visit ?Added or deleted any medications: No ?Patient Arrived: Wheel Chair ?Any new allergies or adverse reactions: No ?Arrival Time: 10:34 ?Had a fall or experienced change in No ?Accompanied By: spouse ?activities of daily living that may affect ?Transfer Assistance: Manual ?risk of falls: ?Patient Identification Verified: Yes ?Signs or symptoms of abuse/neglect since last visito No ?Secondary Verification Process Completed: Yes ?Hospitalized since last visit: No ?Patient Requires Transmission-Based Precautions: No ?Implantable device outside of the clinic excluding No ?Patient Has Alerts: No ?cellular tissue based products placed in the center ?since last visit: ?Has Dressing in Place as Prescribed: Yes ?Pain Present Now: No ?Electronic Signature(s) ?Signed: 07/17/2021 4:07:42 PM By: Adline Peals ?Entered By: Adline Peals on 07/17/2021 10:35:48 ?-------------------------------------------------------------------------------- ?Encounter Discharge Information Details ?Patient Name: Date of Service: ?[name], [name] M. 07/17/2021 10:30 A M ?Medical Record Number: [id number] ?Patient Account Number: [id number] ?Date of Birth/Sex: Treating RN: ?27-Jan-1951 (71 y.o. F) [place]ton, Mechele Claude ?Primary Care Linzi Ohlinger: [name] Other Clinician: ?Referring Henryetta Corriveau: ?Treating Maricella Filyaw/Extender: Fredirick Maudlin ?[name] ?Weeks in Treatment: 132 ?Encounter Discharge Information Items Post Procedure Vitals ?Discharge Condition: Stable ?Temperature (F):  97.8 ?Ambulatory Status: Wheelchair ?Pulse (bpm): 80 ?Discharge Destination: Home ?Respiratory Rate (breaths/min): 18 ?Transportation: Private Auto ?Blood Pressure (mmHg): 152/84 ?Accompanied By: spouse ?Schedule Follow-up Appointment: Yes ?Clinical Summary of Care: Patient Declined ?Electronic Signature(s) ?Signed: 07/17/2021 11:57:49 AM By: Dellie Catholic RN ?Entered By: Dellie Catholic on 07/17/2021 11:56:18 ?-------------------------------------------------------------------------------- ?Lower Extremity Assessment Details ?Patient Name: ?Date of Service: ?[name], [name] M. 07/17/2021 10:30 A M ?Medical Record Number: [id number] ?Patient Account Number: [id number] ?Date of Birth/Sex: ?Treating RN: ?1950-06-10 (71 y.o. F) ?Primary Care Priyal Musquiz: [name] ?Other Clinician: ?Referring Zayra Devito: ?Treating Lucielle Vokes/Extender: Fredirick Maudlin ?[name] ?Weeks in Treatment: 132 ?Electronic Signature(s) ?Signed: 07/17/2021 4:07:42 PM By: Adline Peals ?Entered By: Adline Peals on 07/17/2021 10:41:15 ?-------------------------------------------------------------------------------- ?Multi Wound Chart Details ?Patient Name: ?Date of Service: ?[name], [name] M. 07/17/2021 10:30 A M ?Medical Record Number: [id number] ?Patient Account Number: [id number] ?Date of Birth/Sex: ?Treating RN: ?06-29-1950 (71 y.o. F) ?Primary Care Hira Trent: [name] ?Other Clinician: ?Referring Anglea Gordner: ?Treating Hazel Wrinkle/Extender: Fredirick Maudlin ?[name] ?Weeks in Treatment: 132 ?Vital Signs ?Height(in): 63 ?Pulse(bpm): 80 ?Weight(lbs): 185 ?Blood Pressure(mmHg): 152/84 ?Body Mass Index(BMI): 32.8 ?Temperature(??F): ?Respiratory Rate(breaths/min): 18 ?Photos: [N/A:N/A] ?Left Ischial Tuberosity N/A N/A ?Wound Location: ?Pressure Injury N/A N/A ?Wounding Event: ?Pressure Ulcer N/A N/A ?Primary Etiology: ?Anemia, Hypertension, Type II N/A N/A ?Comorbid History: ?Diabetes, Osteoarthritis, Dementia, ?Paraplegia, Received  Radiation ?09/06/2018 N/A N/A ?Date Acquired: ?26 N/A N/A ?Weeks of Treatment: ?Open N/A N/A ?Wound Status: ?No N/A N/A ?Wound Recurrence: ?2.5x2x1.3 N/A N/A ?Measurements L x W x D (cm) ?3.927 N/A N/A ?A (cm?) : ?rea ?5.105 N/A N/A ?Volume (cm?) : ?-1564.00% N/A N/A ?% Reduction in A rea: ?-2308.00% N/A N/A ?% Reduction in Volume: ?12 ?Starting Position 1 (o'clock): ?12 ?Ending Position 1 (o'clock): ?0.6 ?Maximum Distance 1 (cm): ?Yes N/A N/A ?Undermining: ?Category/Stage IV N/A N/A ?Classification: ?Medium N/A N/A ?Exudate A mount: ?Serosanguineous N/A N/A ?Exudate Type: ?red, brown N/A N/A ?Exudate Color: ?Yes N/A N/A ?Foul Odor A Cleansing: ?fter ?No  N/A N/A ?Odor Anticipated Due to Product ?Use: ?Well defined, not attached N/A N/A ?Wound Margin: ?Large (67-100%) N/A N/A ?Granulation A mount: ?Red, Pink, Hyper-granulation, Friable N/A N/A ?Granulation Quality: ?Small (1-33%) N/A N/A ?Necrotic Amount: ?Fat Layer (Subcutaneous Tissue): Yes N/A N/A ?Exposed Structures: ?Fascia: No ?Tendon: No ?Muscle: No ?Joint: No ?Bone: No ?None N/A N/A ?Epithelialization: ?Debridement - Excisional N/A N/A ?Debridement: ?Pre-procedure Verification/Time Out 11:12 N/A N/A ?Taken: ?Other N/A N/A ?Pain Control: ?Subcutaneous, Slough N/A N/A ?Tissue Debrided: ?Skin/Subcutaneous Tissue N/A N/A ?Level: ?5 N/A N/A ?Debridement A (sq cm): ?rea ?Curette N/A N/A ?Instrument: ?Minimum N/A N/A ?Bleeding: ?Pressure N/A N/A ?Hemostasis A chieved: ?0 N/A N/A ?Procedural Pain: ?0 N/A N/A ?Post Procedural Pain: ?Procedure was tolerated well N/A N/A ?Debridement Treatment Response: ?2.5x2x1.3 N/A N/A ?Post Debridement Measurements L x ?W x D (cm) ?5.105 N/A N/A ?Post Debridement Volume: (cm?) ?Category/Stage IV N/A N/A ?Post Debridement Stage: ?Debridement N/A N/A ?Procedures Performed: ?Treatment Notes ?Electronic Signature(s) ?Signed: 07/17/2021 11:39:55 AM By: Fredirick Maudlin MD FACS ?Entered By: Fredirick Maudlin on 07/17/2021  11:39:55 ?-------------------------------------------------------------------------------- ?Multi-Disciplinary Care Plan Details ?Patient Name: ?Date of Service: ?[name], [name] M. 07/17/2021 10:30 A M ?Medical Record Number: [id number] ?Patient Account Number: [id number] ?Date of Birth/Sex: ?Treating RN: ?January 30, 1951 (71 y.o. F) ?Primary Care Berel Najjar: [name] ?Other Clinician: ?Referring Ameilia Rattan: ?Treating Woodrow Drab/Extender: Fredirick Maudlin ?[name] ?Weeks in Treatment: 132 ?Multidisciplinary Care Plan reviewed with physician ?Active Inactive ?Pressure ?Nursing Diagnoses: ?Knowledge deficit related to management of pressures ulcers ?Potential for impaired tissue integrity related to pressure, friction, moisture, and shear ?Goals: ?Patient/caregiver will verbalize understanding of pressure ulcer management ?Date Initiated: 05/22/2021 ?Target Resolution Date: 08/15/2021 ?Goal Status: Active ?Interventions: ?Assess: immobility, friction, shearing, incontinence upon admission and as needed ?Assess offloading mechanisms upon admission and as needed ?Notes: ?Wound/Skin Impairment ?Nursing Diagnoses: ?Knowledge deficit related to ulceration/compromised skin integrity ?Goals: ?Patient/caregiver will verbalize understanding of skin care regimen ?Date Initiated: 12/30/2018 ?Target Resolution Date: 08/15/2021 ?Goal Status: Active ?Interventions: ?Assess patient/caregiver ability to obtain necessary supplies ?Assess patient/caregiver ability to perform ulcer/skin care regimen upon admission and as needed ?Assess ulceration(s) every visit ?Provide education on ulcer and skin care ?Treatment Activities: ?Skin care regimen initiated : 12/30/2018 ?Topical wound management initiated : 12/30/2018 ?Notes: ?01/08/21: Wound care regimen continues ?Electronic Signature(s) ?Signed: 07/17/2021 4:07:42 PM By: Adline Peals ?Entered By: Adline Peals on 07/17/2021  10:46:53 ?-------------------------------------------------------------------------------- ?Pain Assessment Details ?Patient Name: ?Date of Service: ?[name], [name] M. 07/17/2021 10:30 A M ?Medical Record Number: [id number] ?Patient Account Number: [id number] ?Date of Birth/Sex: ?Treating RN: ?19-Sep-1950 (70

## 2021-07-26 ENCOUNTER — Encounter (HOSPITAL_BASED_OUTPATIENT_CLINIC_OR_DEPARTMENT_OTHER): Payer: Medicare Other | Admitting: General Surgery

## 2021-07-26 DIAGNOSIS — L89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, stage 4: Secondary | ICD-10-CM | POA: Diagnosis not present

## 2021-07-26 NOTE — Progress Notes (Signed)
WISDOM, SEYBOLD. ([id number]) ?Visit Report for 07/26/2021 ?Chief Complaint Document Details ?Patient Name: Date of Service: ?[name], [name] M. 07/26/2021 10:45 A M ?Medical Record Number: [id number] ?Patient Account Number: [id number] ?Date of Birth/Sex: Treating RN: ?03-27-1951 (71 y.o. F) ?Primary Care Provider: [name] Other Clinician: ?Referring Provider: ?Treating Provider/Extender: Fredirick Maudlin ?[name] ?Weeks in Treatment: 134 ?Information Obtained from: Patient ?Chief Complaint ?[name] here for follow up evaluation of left ischial pressure ulcer ?12/30/2018; patient comes back for review of wounds in the same general area over the previously healed left ischial pressure ulcer ?Electronic Signature(s) ?Signed: 07/26/2021 11:41:31 AM By: Fredirick Maudlin MD FACS ?Entered By: Fredirick Maudlin on 07/26/2021 11:41:31 ?-------------------------------------------------------------------------------- ?Debridement Details ?Patient Name: Date of Service: ?[name], [name] M. 07/26/2021 10:45 A M ?Medical Record Number: [id number] ?Patient Account Number: [id number] ?Date of Birth/Sex: Treating RN: ?1950-04-21 (70 y.o. F) Boehlein, Linda ?Primary Care Provider: [name] Other Clinician: ?Referring Provider: ?Treating Provider/Extender: Fredirick Maudlin ?[name] ?Weeks in Treatment: 134 ?Debridement Performed for Assessment: Wound #15 Right Ischium ?Performed By: Physician Fredirick Maudlin, MD ?Debridement Type: Debridement ?Level of Consciousness (Pre-procedure): Awake and Alert ?Pre-procedure Verification/Time Out Yes - 11:30 ?Taken: ?Start Time: 11:31 ?Pain Control: ?Other : benzocaine 20% spray ?T Area Debrided (L x W): ?otal 4.7 (cm) x 4.5 (cm) = 21.15 (cm?) ?Tissue and other material debrided: Viable, Non-Viable, Eschar, Slough, Subcutaneous, Slough ?Level: Skin/Subcutaneous Tissue ?Debridement Description: Excisional ?Instrument: Curette ?Specimen: Tissue Culture ?Number of Specimens T aken: 1 ?Bleeding:  Minimum ?Hemostasis Achieved: Pressure ?Procedural Pain: 0 ?Post Procedural Pain: 0 ?Response to Treatment: Procedure was tolerated well ?Level of Consciousness (Post- Awake and Alert ?procedure): ?Post Debridement Measurements of Total Wound ?Length: (cm) 4.7 ?Stage: Unstageable/Unclassified ?Width: (cm) 4.5 ?Depth: (cm) 0.2 ?Volume: (cm?) 3.322 ?Character of Wound/Ulcer Post Debridement: Requires Further Debridement ?Post Procedure Diagnosis ?Same as Pre-procedure ?Electronic Signature(s) ?Signed: 07/26/2021 12:20:05 PM By: Fredirick Maudlin MD FACS ?Signed: 07/26/2021 6:05:16 PM By: Baruch Gouty RN, BSN ?Entered By: Baruch Gouty on 07/26/2021 11:39:48 ?-------------------------------------------------------------------------------- ?Debridement Details ?Patient Name: ?Date of Service: ?[name], [name] M. 07/26/2021 10:45 A M ?Medical Record Number: [id number] ?Patient Account Number: [id number] ?Date of Birth/Sex: ?Treating RN: ?1951-01-19 (71 y.o. F) Boehlein, Linda ?Primary Care Provider: [name] ?Other Clinician: ?Referring Provider: ?Treating Provider/Extender: Fredirick Maudlin ?[name] ?Weeks in Treatment: 134 ?Debridement Performed for Assessment: Wound #12 Left Ischial Tuberosity ?Performed By: Physician Fredirick Maudlin, MD ?Debridement Type: Debridement ?Level of Consciousness (Pre-procedure): Awake and Alert ?Pre-procedure Verification/Time Out Yes - 11:30 ?Taken: ?Start Time: 11:31 ?Pain Control: ?Other : benzocaine 20% spray ?T Area Debrided (L x W): ?otal 2.7 (cm) x 3.5 (cm) = 9.45 (cm?) ?Tissue and other material debrided: Viable, Non-Viable, Slough, Subcutaneous, Slough ?Level: Skin/Subcutaneous Tissue ?Debridement Description: Excisional ?Instrument: Curette ?Bleeding: Minimum ?Hemostasis Achieved: Pressure ?Procedural Pain: 0 ?Post Procedural Pain: 0 ?Response to Treatment: Procedure was tolerated well ?Level of Consciousness (Post- Awake and Alert ?procedure): ?Post Debridement Measurements  of Total Wound ?Length: (cm) 2.7 ?Stage: Category/Stage IV ?Width: (cm) 3.5 ?Depth: (cm) 1.1 ?Volume: (cm?) 8.164 ?Character of Wound/Ulcer Post Debridement: Requires Further Debridement ?Post Procedure Diagnosis ?Same as Pre-procedure ?Electronic Signature(s) ?Signed: 07/26/2021 12:20:05 PM By: Fredirick Maudlin MD FACS ?Signed: 07/26/2021 6:05:16 PM By: Baruch Gouty RN, BSN ?Entered By: Baruch Gouty on 07/26/2021 11:40:37 ?-------------------------------------------------------------------------------- ?HPI Details ?Patient Name: ?Date of Service: ?[name], [name] M. 07/26/2021 10:45 A M ?Medical Record Number: [id number] ?Patient Account Number: [id number] ?Date of Birth/Sex: Treating RN: ?May 11, 1950 (70 y.o.  F) ?Primary Care Provider: Other Clinician: ?[name] ?Referring Provider: ?Treating Provider/Extender: Fredirick Maudlin ?[name] ?Weeks in Treatment: 134 ?History of Present Illness ?Location: open ulceration of the left gluteal area, left heel and right ankle for about 5 months. ?Quality: Patient reports No Pain. ?Severity: Patient states wound(s) are getting worse. ?Duration: Patient has had the wound for > 5 months prior to seeking treatment at the wound center ?Context: The wound occurred when the patient has been paraplegic for about 3 years. ?Modifying Factors: Wound improving due to current treatment. ?ssociated Signs and Symptoms: Patient reports having foul odor. ?A ?HPI Description: this 71 year old patient who [name] known to have hypertension, hypothyroidism, breast cancer, chronic pain syndrome, paraplegia was noted to ?have a left gluteal decubitus ulcer and was brought into the hospital. During the course of her hospitalization she was debrided in the operating room by ankle ?wound. Bone cultures were taken at that time but were negative but clinically she was treated for osteomyelitis because of the probing down to bone and open ?exposed bone. Home health has been giving her antibioticss  which include vancomycin and Zosyn. ?The patient was a smoker until about 3 weeks ago and used to smoke about 10 cigarettes a day for a long while. ?12/13/2014 - details of her operative note from 11/03/2014 were reviewed -- PROCEDURE: 1. Excisional debridement skin, subcutaneous, muscle left ?ischium 35 cm2 2. Excisional debridement skin, subcutaneous tissue left heel 27 cm2 3. Excisional debridement right ankle skin, subcutaneous, bone 30 cm2 ?01/24/2015 -- she has some issues with her wheelchair cushion but other than that [name] doing very well and has received Podus boots for her feet. ?02/14/2015 -- she was using her old offloading boots and this seemed to have caused her a new pressure ulcer on the left posterior heel near the superior part ?just below the Achilles tendon. ?03/07/2015 -- she has a new ulceration just to the left of the midline on her sacral region more on the left buttock and this has been there for Dr. Leland Johns and ?had all the wounds sharply debrided. The debridement was done for the left ischial wound, the left heel wound and the right about a week. ?08/22/2015 -- was recently admitted to hospital between May 5 and 08/13/2015, with sepsis and leukocytosis due to a UTI. she was treated for a sepsis ?complicating Escherichia coli UTI and kidney stones. She also had metabolic and careful up at the secondary to pyelonephritis. He received broad-spectrum ?antibiotics initially and then received Macrobid as per urology. She was sent home on nitrofurantoin. during her admission she had a CT scan which showed ?exposed left ischial tuberosity without evidence of osteolysis. ?09/12/2015-- the patient [name] having some issues with her air mattress and would like to get a opinion from medical modalities. ?10/10/2015 -- the issue with her air mattress has not yet been sorted out and the new problem seems to be a lot of odor from the wound VAC. ?11/27/2015 -- the patient was admitted to the hospital between  July 23 and 10/31/2015. Her problems were sepsis, osteomyelitis of the pelvic bone and acute ?pyelonephritis. CT of the abdomen and pelvis was consistent with a left-sided pyelonephritis with hydronephrosis an

## 2021-07-26 NOTE — Progress Notes (Signed)
[name], [name]. ([id number]) ?Visit Report for 07/26/2021 ?Arrival Information Details ?Patient Name: Date of Service: ?[name], [name] M. 07/26/2021 10:45 A M ?Medical Record Number: [id number] ?Patient Account Number: [id number] ?Date of Birth/Sex: Treating RN: ?1950/10/05 (71 y.o. F) Boehlein, Linda ?Primary Care Hazael Olveda: [name] Other Clinician: ?Referring Sakiyah Shur: ?Treating Darek Eifler/Extender: Fredirick Maudlin ?[name] ?Weeks in Treatment: 134 ?Visit Information History Since Last Visit ?Added or deleted any medications: No ?Patient Arrived: Wheel Chair ?Any new allergies or adverse reactions: No ?Arrival Time: 10:58 ?Had a fall or experienced change in No ?Accompanied By: spouse ?activities of daily living that may affect ?Transfer Assistance: Manual ?risk of falls: ?Patient Identification Verified: Yes ?Signs or symptoms of abuse/neglect since last visito No ?Secondary Verification Process Completed: Yes ?Hospitalized since last visit: No ?Patient Requires Transmission-Based Precautions: No ?Implantable device outside of the clinic excluding No ?Patient Has Alerts: No ?cellular tissue based products placed in the center ?since last visit: ?Has Dressing in Place as Prescribed: Yes ?Pain Present Now: Yes ?Electronic Signature(s) ?Signed: 07/26/2021 6:05:16 PM By: Baruch Gouty RN, BSN ?Entered By: Baruch Gouty on 07/26/2021 11:03:24 ?-------------------------------------------------------------------------------- ?Encounter Discharge Information Details ?Patient Name: Date of Service: ?[name], [name] M. 07/26/2021 10:45 A M ?Medical Record Number: [id number] ?Patient Account Number: [id number] ?Date of Birth/Sex: Treating RN: ?[birth year] (71 y.o. F) Boehlein, Linda ?Primary Care Nilay Mangrum: [name] Other Clinician: ?Referring Byard Carranza: ?Treating Mando Blatz/Extender: Fredirick Maudlin ?[name] ?Weeks in Treatment: 134 ?Encounter Discharge Information Items Post Procedure Vitals ?Discharge Condition:  Stable ?Temperature (F): 97.5 ?Ambulatory Status: Wheelchair ?Pulse (bpm): 85 ?Discharge Destination: Home ?Respiratory Rate (breaths/min): 18 ?Transportation: Private Auto ?Blood Pressure (mmHg): 124/68 ?Accompanied By: spouse ?Schedule Follow-up Appointment: Yes ?Clinical Summary of Care: Patient Declined ?Electronic Signature(s) ?Signed: 07/26/2021 6:05:16 PM By: Baruch Gouty RN, BSN ?Entered By: Baruch Gouty on 07/26/2021 12:18:46 ?-------------------------------------------------------------------------------- ?Lower Extremity Assessment Details ?Patient Name: ?Date of Service: ?[name], [name] M. 07/26/2021 10:45 A M ?Medical Record Number: [id number] ?Patient Account Number: [id number] ?Date of Birth/Sex: ?Treating RN: ?[birth year] (71 y.o. F) Boehlein, Linda ?Primary Care Latessa Tillis: [name] ?Other Clinician: ?Referring Lavere Stork: ?Treating Tayjah Lobdell/Extender: Fredirick Maudlin ?[name] ?Weeks in Treatment: 134 ?Electronic Signature(s) ?Signed: 07/26/2021 6:05:16 PM By: Baruch Gouty RN, BSN ?Entered By: Baruch Gouty on 07/26/2021 11:09:31 ?-------------------------------------------------------------------------------- ?Multi Wound Chart Details ?Patient Name: ?Date of Service: ?[name], [name] M. 07/26/2021 10:45 A M ?Medical Record Number: [id number] ?Patient Account Number: [id number] ?Date of Birth/Sex: ?Treating RN: ?[birth year] (71 y.o. F) ?Primary Care Barbi Kumagai: [name] ?Other Clinician: ?Referring Davison Ohms: ?Treating Emori Kamau/Extender: Fredirick Maudlin ?[name] ?Weeks in Treatment: 134 ?Vital Signs ?Height(in): 63 ?Pulse(bpm): 85 ?Weight(lbs): 185 ?Blood Pressure(mmHg): 124/68 ?Body Mass Index(BMI): 32.8 ?Temperature(??F): 97.5 ?Respiratory Rate(breaths/min): 18 ?Photos: [N/A:N/A] ?Left Ischial Tuberosity Right Ischium N/A ?Wound Location: ?Pressure Injury Pressure Injury N/A ?Wounding Event: ?Pressure Ulcer Pressure Ulcer N/A ?Primary Etiology: ?Anemia, Hypertension, Type II Anemia,  Hypertension, Type II N/A ?Comorbid History: ?Diabetes, Osteoarthritis, Dementia, Diabetes, Osteoarthritis, Dementia, ?Paraplegia, Received Radiation Paraplegia, Received Radiation ?09/06/2018 07/21/2021 N/A ?Date Acquired: ?134 0 N/A ?Weeks of Treatment: ?Open Open N/A ?Wound Status: ?No No N/A ?Wound Recurrence: ?2.7x3.5x1.1 4.7x4.5x0.1 N/A ?Measurements L x W x D (cm) ?7.422 16.611 N/A ?A (cm?) : ?rea ?8.164 1.661 N/A ?Volume (cm?) : ?-3044.90% 0.00% N/A ?% Reduction in A rea: ?-3750.90% 0.00% N/A ?% Reduction in Volume: ?9 ?Starting Position 1 (o'clock): ?12 ?Ending Position 1 (o'clock): ?0.9 ?Maximum Distance 1 (cm): ?Yes No N/A ?Undermining: ?Category/Stage IV Unstageable/Unclassified N/A ?Classification: ?Medium Medium N/A ?Exudate  A mount: ?Serosanguineous Purulent N/A ?Exudate Type: ?red, brown yellow, brown, green N/A ?Exudate Color: ?No Yes N/A ?Foul Odor A Cleansing: ?fter ?N/A No N/A ?Odor Anticipated Due to Product ?Use: ?Well defined, not attached Flat and Intact N/A ?Wound Margin: ?Medium (34-66%) Small (1-33%) N/A ?Granulation A mount: ?Red, Pink Pink N/A ?Granulation Quality: ?Medium (34-66%) Large (67-100%) N/A ?Necrotic Amount: ?Adherent GreendaleNecrotic Tissue: ?Fat Layer (Subcutaneous Tissue): Yes Fat Layer (Subcutaneous Tissue): Yes N/A ?Exposed Structures: ?Fascia: No ?Fascia: No ?Tendon: No ?Tendon: No ?Muscle: No ?Muscle: No ?Joint: No ?Joint: No ?Bone: No ?Bone: No ?None Small (1-33%) N/A ?Epithelialization: ?Debridement - Excisional Debridement - Excisional N/A ?Debridement: ?Pre-procedure Verification/Time Out 11:30 11:30 N/A ?Taken: ?Other Other N/A ?Pain Control: ?Subcutaneous, Slough Necrotic/Eschar, Subcutaneous, N/A ?Tissue Debrided: ?Slough ?Skin/Subcutaneous Tissue Skin/Subcutaneous Tissue N/A ?Level: ?9.45 21.15 N/A ?Debridement A (sq cm): ?rea ?Curette Curette N/A ?Instrument: ?Minimum Minimum N/A ?Bleeding: ?Pressure Pressure N/A ?Hemostasis  Achieved: ?0 0 N/A ?Procedural Pain: ?0 0 N/A ?Post Procedural Pain: ?Procedure was tolerated well Procedure was tolerated well N/A ?Debridement Treatment Response: ?2.7x3.5x1.1 4.7x4.5x0.2 N/A ?Post Debridement Measurements L x ?W x D (cm) ?8.164 3.322 N/A ?Post Debridement Volume: (cm?) ?Category/Stage IV Unstageable/Unclassified N/A ?Post Debridement Stage: ?N/A green drainage N/A ?Assessment Notes: ?Debridement Debridement N/A ?Procedures Performed: ?Treatment Notes ?Electronic Signature(s) ?Signed: 07/26/2021 11:41:17 AM By: Fredirick Maudlin MD FACS ?Entered By: Fredirick Maudlin on 07/26/2021 11:41:17 ?-------------------------------------------------------------------------------- ?Multi-Disciplinary Care Plan Details ?Patient Name: ?Date of Service: ?[name], Darionna M. 07/26/2021 10:45 A M ?Medical Record Number: [id number] ?Patient Account Number: [id number] ?Date of Birth/Sex: ?Treating RN: ?1950/12/30 (71 y.o. F) Boehlein, Linda ?Primary Care Kadin Bera: [name] ?Other Clinician: ?Referring Jerusalem Brownstein: ?Treating Ramin Zoll/Extender: Fredirick Maudlin ?[name] ?Weeks in Treatment: 134 ?Multidisciplinary Care Plan reviewed with physician ?Active Inactive ?Pressure ?Nursing Diagnoses: ?Knowledge deficit related to management of pressures ulcers ?Potential for impaired tissue integrity related to pressure, friction, moisture, and shear ?Goals: ?Patient/caregiver will verbalize understanding of pressure ulcer management ?Date Initiated: 05/22/2021 ?Target Resolution Date: 08/15/2021 ?Goal Status: Active ?Interventions: ?Assess: immobility, friction, shearing, incontinence upon admission and as needed ?Assess offloading mechanisms upon admission and as needed ?Notes: ?Wound/Skin Impairment ?Nursing Diagnoses: ?Knowledge deficit related to ulceration/compromised skin integrity ?Goals: ?Patient/caregiver will verbalize understanding of skin care regimen ?Date Initiated: 12/30/2018 ?Target Resolution Date: 08/15/2021 ?Goal  Status: Active ?Interventions: ?Assess patient/caregiver ability to obtain necessary supplies ?Assess patient/caregiver ability to perform ulcer/skin care regimen upon admission and as needed ?Assess ulceration(s) every visit ?Prov

## 2021-08-14 ENCOUNTER — Encounter (HOSPITAL_BASED_OUTPATIENT_CLINIC_OR_DEPARTMENT_OTHER): Payer: Medicare Other | Attending: General Surgery | Admitting: General Surgery

## 2021-08-14 DIAGNOSIS — E039 Hypothyroidism, unspecified: Secondary | ICD-10-CM | POA: Diagnosis not present

## 2021-08-14 DIAGNOSIS — L89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, stage 4: Secondary | ICD-10-CM | POA: Diagnosis present

## 2021-08-14 DIAGNOSIS — I10 Essential (primary) hypertension: Secondary | ICD-10-CM | POA: Insufficient documentation

## 2021-08-14 DIAGNOSIS — Z853 Personal history of malignant neoplasm of breast: Secondary | ICD-10-CM | POA: Insufficient documentation

## 2021-08-14 DIAGNOSIS — Z87891 Personal history of nicotine dependence: Secondary | ICD-10-CM | POA: Diagnosis not present

## 2021-08-14 DIAGNOSIS — G8220 Paraplegia, unspecified: Secondary | ICD-10-CM | POA: Insufficient documentation

## 2021-08-14 DIAGNOSIS — G894 Chronic pain syndrome: Secondary | ICD-10-CM | POA: Insufficient documentation

## 2021-08-14 DIAGNOSIS — L89310 Pressure ulcer of right buttock, unstageable: Secondary | ICD-10-CM | POA: Insufficient documentation

## 2021-08-14 DIAGNOSIS — E11622 Type 2 diabetes mellitus with other skin ulcer: Secondary | ICD-10-CM | POA: Insufficient documentation

## 2021-08-14 LAB — GLUCOSE, CAPILLARY: Glucose-Capillary: 49 mg/dL — ABNORMAL LOW (ref 70–99)

## 2021-08-14 NOTE — Progress Notes (Signed)
[name], [name]. ([id number]) ?Visit Report for 08/14/2021 ?Arrival Information Details ?Patient Name: Date of Service: ?[name], [name]. 08/14/2021 10:30 A [name] ?Medical Record Number: [id number] ?Patient Account Number: [id number] ?Date of Birth/Sex: Treating RN: ?July 12, 1950 (71 y.o. F) Boehlein, Linda ?Primary Care Alan Riles: [name] Other Clinician: ?Referring Berenis Corter: ?Treating Arun Herrod/Extender: Fredirick Maudlin ?[name] ?Weeks in Treatment: 136 ?Visit Information History Since Last Visit ?Added or deleted any medications: No ?Patient Arrived: Wheel Chair ?Any new allergies or adverse reactions: No ?Arrival Time: 10:31 ?Had a fall or experienced change in No ?Accompanied By: spouse ?activities of daily living that may affect ?Transfer Assistance: Manual ?risk of falls: ?Patient Identification Verified: Yes ?Signs or symptoms of abuse/neglect since last visito No ?Secondary Verification Process Completed: Yes ?Hospitalized since last visit: No ?Patient Requires Transmission-Based Precautions: No ?Implantable device outside of the clinic excluding No ?Patient Has Alerts: No ?cellular tissue based products placed in the center ?since last visit: ?Has Dressing in Place as Prescribed: Yes ?Pain Present Now: Yes ?Notes ?pt states stopped antibiotics Saturday due to nausea and vomiting ?Electronic Signature(s) ?Signed: 08/14/2021 5:37:56 PM By: Baruch Gouty RN, BSN ?Entered By: Baruch Gouty on 08/14/2021 10:35:49 ?-------------------------------------------------------------------------------- ?Encounter Discharge Information Details ?Patient Name: Date of Service: ?[name], [name]. 08/14/2021 10:30 A [name] ?Medical Record Number: [id number] ?Patient Account Number: [id number] ?Date of Birth/Sex: Treating RN: ?1951/01/03 (71 y.o. F) Boehlein, Linda ?Primary Care Twylla Arceneaux: [name] Other Clinician: ?Referring Jalesa Thien: ?Treating Derron Pipkins/Extender: Fredirick Maudlin ?[name] ?Weeks in Treatment: 136 ?Encounter  Discharge Information Items Post Procedure Vitals ?Discharge Condition: Stable ?Temperature (F): 97.6 ?Ambulatory Status: Wheelchair ?Pulse (bpm): 89 ?Discharge Destination: Home ?Respiratory Rate (breaths/min): 18 ?Transportation: Private Auto ?Blood Pressure (mmHg): 120/72 ?Accompanied By: spouse ?Schedule Follow-up Appointment: Yes ?Clinical Summary of Care: Patient Declined ?Electronic Signature(s) ?Signed: 08/14/2021 5:37:56 PM By: Baruch Gouty RN, BSN ?Entered By: Baruch Gouty on 08/14/2021 11:47:20 ?-------------------------------------------------------------------------------- ?Lower Extremity Assessment Details ?Patient Name: ?Date of Service: ?[name], [name]. 08/14/2021 10:30 A [name] ?Medical Record Number: [id number] ?Patient Account Number: [id number] ?Date of Birth/Sex: ?Treating RN: ?1950-09-15 (71 y.o. F) Boehlein, Linda ?Primary Care Triana Coover: [name] ?Other Clinician: ?Referring Jarron Curley: ?Treating Shaylene Paganelli/Extender: Fredirick Maudlin ?[name] ?Weeks in Treatment: 136 ?Electronic Signature(s) ?Signed: 08/14/2021 5:37:56 PM By: Baruch Gouty RN, BSN ?Entered By: Baruch Gouty on 08/14/2021 10:37:08 ?-------------------------------------------------------------------------------- ?Multi Wound Chart Details ?Patient Name: ?Date of Service: ?[name], [name]. 08/14/2021 10:30 A [name] ?Medical Record Number: [id number] ?Patient Account Number: [id number] ?Date of Birth/Sex: ?Treating RN: ?1950/07/05 (71 y.o. F) Boehlein, Linda ?Primary Care Aireanna Luellen: [name] ?Other Clinician: ?Referring Draydon Clairmont: ?Treating Blaiden Werth/Extender: Fredirick Maudlin ?[name] ?Weeks in Treatment: 136 ?Vital Signs ?Height(in): 63 ?Capillary Blood Glucose(mg/dl): 49 ?Weight(lbs): 185 ?Pulse(bpm): 89 ?Body Mass Index(BMI): 32.8 ?Blood Pressure(mmHg): 120/72 ?Temperature(??F): 97.6 ?Respiratory Rate(breaths/min): 18 ?Photos: [N/A:N/A] ?Left Ischial Tuberosity Right Ischium N/A ?Wound Location: ?Pressure Injury Pressure Injury  N/A ?Wounding Event: ?Pressure Ulcer Pressure Ulcer N/A ?Primary Etiology: ?Anemia, Hypertension, Type II Anemia, Hypertension, Type II N/A ?Comorbid History: ?Diabetes, Osteoarthritis, Dementia, Diabetes, Osteoarthritis, Dementia, ?Paraplegia, Received Radiation Paraplegia, Received Radiation ?09/06/2018 07/21/2021 N/A ?Date Acquired: ?136 2 N/A ?Weeks of Treatment: ?Open Open N/A ?Wound Status: ?No No N/A ?Wound Recurrence: ?3.5x4x1.5 3.1x2x0.5 N/A ?Measurements L x W x D (cm) ?10.996 4.869 N/A ?A (cm?) : ?rea ?16.493 2.435 N/A ?Volume (cm?) : ?-4559.30% 70.70% N/A ?% Reduction in A rea: ?-7679.70% -46.60% N/A ?% Reduction in Volume: ?4 ?Starting Position 1 (o'clock): ?6 ?Ending Position 1 (o'clock): ?2.5 ?  Maximum Distance 1 (cm): ?Yes No N/A ?Undermining: ?Category/Stage IV Category/Stage III N/A ?Classification: ?Medium Medium N/A ?Exudate A mount: ?Serosanguineous Serosanguineous N/A ?Exudate Type: ?red, brown red, brown N/A ?Exudate Color: ?Well defined, not attached Flat and Intact N/A ?Wound Margin: ?Medium (34-66%) Small (1-33%) N/A ?Granulation Amount: ?Red, Pink Pink N/A ?Granulation Quality: ?Medium (34-66%) Large (67-100%) N/A ?Necrotic Amount: ?Fat Layer (Subcutaneous Tissue): Yes Fat Layer (Subcutaneous Tissue): Yes N/A ?Exposed Structures: ?Fascia: No ?Fascia: No ?Tendon: No ?Tendon: No ?Muscle: No ?Muscle: No ?Joint: No ?Joint: No ?Bone: No ?Bone: No ?Small (1-33%) Small (1-33%) N/A ?Epithelialization: ?Debridement - Excisional Debridement - Excisional N/A ?Debridement: ?Pre-procedure Verification/Time Out 11:15 11:15 N/A ?Taken: ?Other Other N/A ?Pain Control: ?Subcutaneous, Slough Subcutaneous, Slough N/A ?Tissue Debrided: ?Skin/Subcutaneous Tissue Skin/Subcutaneous Tissue N/A ?Level: ?14 6.2 N/A ?Debridement A (sq cm): ?rea ?Curette Curette N/A ?Instrument: ?Minimum Minimum N/A ?Bleeding: ?Pressure Pressure N/A ?Hemostasis A chieved: ?0 0 N/A ?Procedural Pain: ?0 0 N/A ?Post Procedural  Pain: ?Procedure was tolerated well Procedure was tolerated well N/A ?Debridement Treatment Response: ?3.5x4x1.5 3.1x2x0.5 N/A ?Post Debridement Measurements L x ?W x D (cm) ?16.493 2.435 N/A ?Post Debridement Volume: (cm?) ?Category/Stage IV Category/Stage III N/A ?Post Debridement Stage: ?Debridement Debridement N/A ?Procedures Performed: ?Treatment Notes ?Wound #12 (Ischial Tuberosity) Wound Laterality: Left ?Cleanser ?Soap and Water ?Discharge Instruction: May shower and wash wound with dial antibacterial soap and water prior to dressing change. ?Wound Cleanser ?Discharge Instruction: Cleanse the wound with wound cleanser prior to applying a clean dressing using gauze sponges, not tissue or cotton balls. ?Peri-Wound Care ?Zinc Oxide Ointment 30g tube ?Discharge Instruction: Apply Zinc Oxide to periwound with each dressing change ?Skin Prep ?Discharge Instruction: Use skin prep as directed ?Topical ?keystone antibiotic compound ?Primary Dressing ?KerraCel Ag Gelling Fiber Dressing, 4x5 in (silver alginate) ?Discharge Instruction: Apply silver alginate to wound bed as instructed ?Secondary Dressing ?Woven Gauze Sponges 2x2 in ?Discharge Instruction: moisten with saline and pack lightly over collagen ?ComfortFoam Border, 4x4 in (silicone border) ?Discharge Instruction: Apply over primary dressing as directed. ?Secured With ?Compression Wrap ?Compression Stockings ?Add-Ons ?Wound #15 (Ischium) Wound Laterality: Right ?Cleanser ?Peri-Wound Care ?Skin Prep ?Discharge Instruction: Use skin prep as directed ?Zinc Oxide Ointment 30g tube ?Discharge Instruction: Apply Zinc Oxide to periwound with each dressing change ?Topical ?keystone antibiotic compound ?Discharge Instruction: apply thin layer to wound bed ?Primary Dressing ?Iodosorb Gel 10 (gm) Tube ?Discharge Instruction: Apply to wound bed as instructed ?Secondary Dressing ?ComfortFoam Border, 4x4 in (silicone border) ?Discharge Instruction: Apply over primary  dressing as directed. ?Secured With ?Compression Wrap ?Compression Stockings ?Add-Ons ?Electronic Signature(s) ?Signed: 08/14/2021 12:15:47 PM By: Fredirick Maudlin MD FACS ?Signed: 08/14/2021 5:37:56 PM By: Baruch Gouty RN,

## 2021-08-14 NOTE — Progress Notes (Addendum)
ADEL, NEYER. ([id number]) ?Visit Report for 08/14/2021 ?Chief Complaint Document Details ?Patient Name: Date of Service: ?[name], [name] M. 08/14/2021 10:30 A M ?Medical Record Number: [id number] ?Patient Account Number: [id number] ?Date of Birth/Sex: Treating RN: ?[birth year] (71 y.o. F) Boehlein, Linda ?Primary Care Provider: [name] Other Clinician: ?Referring Provider: ?Treating Provider/Extender: Fredirick Maudlin ?[name] ?Weeks in Treatment: 136 ?Information Obtained from: Patient ?Chief Complaint ?[name] here for follow up evaluation of left ischial pressure ulcer ?12/30/2018; patient comes back for review of wounds in the same general area over the previously healed left ischial pressure ulcer ?Electronic Signature(s) ?Signed: 08/14/2021 12:16:00 PM By: Fredirick Maudlin MD FACS ?Entered By: Fredirick Maudlin on 08/14/2021 12:15:59 ?-------------------------------------------------------------------------------- ?Debridement Details ?Patient Name: Date of Service: ?[name], [name] M. 08/14/2021 10:30 A M ?Medical Record Number: [id number] ?Patient Account Number: [id number] ?Date of Birth/Sex: Treating RN: ?January 18, 1951 (71 y.o. F) Boehlein, Linda ?Primary Care Provider: [name] Other Clinician: ?Referring Provider: ?Treating Provider/Extender: Fredirick Maudlin ?[name] ?Weeks in Treatment: 136 ?Debridement Performed for Assessment: Wound #15 Right Ischium ?Performed By: Physician Fredirick Maudlin, MD ?Debridement Type: Debridement ?Level of Consciousness (Pre-procedure): Awake and Alert ?Pre-procedure Verification/Time Out Yes - 11:15 ?Taken: ?Start Time: 11:18 ?Pain Control: ?Other : benzocaine 20% ?T Area Debrided (L x W): ?otal 3.1 (cm) x 2 (cm) = 6.2 (cm?) ?Tissue and other material debrided: Viable, Non-Viable, Slough, Subcutaneous, Slough ?Level: Skin/Subcutaneous Tissue ?Debridement Description: Excisional ?Instrument: Curette ?Bleeding: Minimum ?Hemostasis Achieved: Pressure ?Procedural Pain:  0 ?Post Procedural Pain: 0 ?Response to Treatment: Procedure was tolerated well ?Level of Consciousness (Post- Awake and Alert ?procedure): ?Post Debridement Measurements of Total Wound ?Length: (cm) 3.1 ?Stage: Category/Stage III ?Width: (cm) 2 ?Depth: (cm) 0.5 ?Volume: (cm?) 2.435 ?Character of Wound/Ulcer Post Debridement: Requires Further Debridement ?Post Procedure Diagnosis ?Same as Pre-procedure ?Electronic Signature(s) ?Signed: 08/14/2021 2:22:22 PM By: Fredirick Maudlin MD FACS ?Signed: 08/14/2021 5:37:56 PM By: Baruch Gouty RN, BSN ?Entered By: Baruch Gouty on 08/14/2021 11:19:26 ?-------------------------------------------------------------------------------- ?Debridement Details ?Patient Name: ?Date of Service: ?[name], [name] M. 08/14/2021 10:30 A M ?Medical Record Number: [id number] ?Patient Account Number: [id number] ?Date of Birth/Sex: ?Treating RN: ?10-Apr-1950 (71 y.o. F) Boehlein, Linda ?Primary Care Provider: [name] ?Other Clinician: ?Referring Provider: ?Treating Provider/Extender: Fredirick Maudlin ?[name] ?Weeks in Treatment: 136 ?Debridement Performed for Assessment: Wound #12 Left Ischial Tuberosity ?Performed By: Physician Fredirick Maudlin, MD ?Debridement Type: Debridement ?Level of Consciousness (Pre-procedure): Awake and Alert ?Pre-procedure Verification/Time Out Yes - 11:15 ?Taken: ?Start Time: 11:18 ?Pain Control: ?Other : benzocaine 20% ?T Area Debrided (L x W): ?otal 3.5 (cm) x 4 (cm) = 14 (cm?) ?Tissue and other material debrided: Viable, Non-Viable, Slough, Subcutaneous, Slough ?Level: Skin/Subcutaneous Tissue ?Debridement Description: Excisional ?Instrument: Curette ?Bleeding: Minimum ?Hemostasis Achieved: Pressure ?Procedural Pain: 0 ?Post Procedural Pain: 0 ?Response to Treatment: Procedure was tolerated well ?Level of Consciousness (Post- Awake and Alert ?procedure): ?Post Debridement Measurements of Total Wound ?Length: (cm) 3.5 ?Stage: Category/Stage IV ?Width: (cm)  4 ?Depth: (cm) 1.5 ?Volume: (cm?) 16.493 ?Character of Wound/Ulcer Post Debridement: Requires Further Debridement ?Post Procedure Diagnosis ?Same as Pre-procedure ?Electronic Signature(s) ?Signed: 08/14/2021 2:22:22 PM By: Fredirick Maudlin MD FACS ?Signed: 08/14/2021 5:37:56 PM By: Baruch Gouty RN, BSN ?Entered By: Baruch Gouty on 08/14/2021 11:20:59 ?-------------------------------------------------------------------------------- ?HPI Details ?Patient Name: ?Date of Service: ?[name], Kallie M. 08/14/2021 10:30 A M ?Medical Record Number: [id number] ?Patient Account Number: [id number] ?Date of Birth/Sex: ?Treating RN: ?05/17/1950 (71 y.o. F) Boehlein, Linda ?Primary Care Provider: [name] ?Other  Clinician: ?Referring Provider: ?Treating Provider/Extender: Fredirick Maudlin ?[name] ?Weeks in Treatment: 136 ?History of Present Illness ?Location: open ulceration of the left gluteal area, left heel and right ankle for about 5 months. ?Quality: Patient reports No Pain. ?Severity: Patient states wound(s) are getting worse. ?Duration: Patient has had the wound for > 5 months prior to seeking treatment at the wound center ?Context: The wound occurred when the patient has been paraplegic for about 3 years. ?Modifying Factors: Wound improving due to current treatment. ?ssociated Signs and Symptoms: Patient reports having foul odor. ?A ?HPI Description: this 71 year old patient who [name] known to have hypertension, hypothyroidism, breast cancer, chronic pain syndrome, paraplegia was noted to ?have a left gluteal decubitus ulcer and was brought into the hospital. During the course of her hospitalization she was debrided in the operating room by ankle ?wound. Bone cultures were taken at that time but were negative but clinically she was treated for osteomyelitis because of the probing down to bone and open ?exposed bone. Home health has been giving her antibioticss which include vancomycin and Zosyn. ?The patient was a smoker  until about 3 weeks ago and used to smoke about 10 cigarettes a day for a long while. ?12/13/2014 - details of her operative note from 11/03/2014 were reviewed -- PROCEDURE: 1. Excisional debridement skin, subcutaneous, muscle left ?ischium 35 cm2 2. Excisional debridement skin, subcutaneous tissue left heel 27 cm2 3. Excisional debridement right ankle skin, subcutaneous, bone 30 cm2 ?01/24/2015 -- she has some issues with her wheelchair cushion but other than that [name] doing very well and has received Podus boots for her feet. ?02/14/2015 -- she was using her old offloading boots and this seemed to have caused her a new pressure ulcer on the left posterior heel near the superior part ?just below the Achilles tendon. ?03/07/2015 -- she has a new ulceration just to the left of the midline on her sacral region more on the left buttock and this has been there for Dr. Leland Johns and ?had all the wounds sharply debrided. The debridement was done for the left ischial wound, the left heel wound and the right about a week. ?08/22/2015 -- was recently admitted to hospital between May 5 and 08/13/2015, with sepsis and leukocytosis due to a UTI. she was treated for a sepsis ?complicating Escherichia coli UTI and kidney stones. She also had metabolic and careful up at the secondary to pyelonephritis. He received broad-spectrum ?antibiotics initially and then received Macrobid as per urology. She was sent home on nitrofurantoin. during her admission she had a CT scan which showed ?exposed left ischial tuberosity without evidence of osteolysis. ?09/12/2015-- the patient [name] having some issues with her air mattress and would like to get a opinion from medical modalities. ?10/10/2015 -- the issue with her air mattress has not yet been sorted out and the new problem seems to be a lot of odor from the wound VAC. ?11/27/2015 -- the patient was admitted to the hospital between July 23 and 10/31/2015. Her problems were sepsis, osteomyelitis  of the pelvic bone and acute ?pyelonephritis. CT of the abdomen and pelvis was consistent with a left-sided pyelonephritis with hydronephrosis and also just showed new sclerosis of the ?posterior portion of t

## 2021-09-11 ENCOUNTER — Encounter (HOSPITAL_BASED_OUTPATIENT_CLINIC_OR_DEPARTMENT_OTHER): Payer: Medicare Other | Attending: General Surgery | Admitting: General Surgery

## 2021-09-11 DIAGNOSIS — E11622 Type 2 diabetes mellitus with other skin ulcer: Secondary | ICD-10-CM | POA: Insufficient documentation

## 2021-09-11 DIAGNOSIS — E039 Hypothyroidism, unspecified: Secondary | ICD-10-CM | POA: Insufficient documentation

## 2021-09-11 DIAGNOSIS — L89310 Pressure ulcer of right buttock, unstageable: Secondary | ICD-10-CM | POA: Diagnosis not present

## 2021-09-11 DIAGNOSIS — Z853 Personal history of malignant neoplasm of breast: Secondary | ICD-10-CM | POA: Diagnosis not present

## 2021-09-11 DIAGNOSIS — L89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, stage 4: Secondary | ICD-10-CM | POA: Diagnosis not present

## 2021-09-11 DIAGNOSIS — Z87891 Personal history of nicotine dependence: Secondary | ICD-10-CM | POA: Insufficient documentation

## 2021-09-11 DIAGNOSIS — G8220 Paraplegia, unspecified: Secondary | ICD-10-CM | POA: Insufficient documentation

## 2021-09-11 DIAGNOSIS — G894 Chronic pain syndrome: Secondary | ICD-10-CM | POA: Diagnosis not present

## 2021-09-11 DIAGNOSIS — I10 Essential (primary) hypertension: Secondary | ICD-10-CM | POA: Diagnosis not present

## 2021-09-11 NOTE — Progress Notes (Addendum)
CORDELL, COKE ([id number]) Visit Report for 09/11/2021 Chief Complaint Document Details Patient Name: Date of Service: [name], [name] 09/11/2021 10:30 A M Medical Record Number: [id number] Patient Account Number: [id number] Date of Birth/Sex: Treating RN: 1951/01/02 (71 y.o. [name]: [name] Other Clinician: Referring [name]: Treating [name]/Extender: [name] in Treatment: 140 Information Obtained from: Patient Chief Complaint She is here for follow up evaluation of left ischial pressure ulcer 12/30/2018; patient comes back for review of wounds in the same general area over the previously healed left ischial pressure ulcer Electronic Signature(s) Signed: 09/11/2021 12:05:42 PM By: Fredirick Maudlin MD FACS Entered By: Fredirick Maudlin on 09/11/2021 12:05:42 -------------------------------------------------------------------------------- Debridement Details Patient Name: Date of Service: [name]. 09/11/2021 10:30 A M Medical Record Number: [id number] Patient Account Number: [id number] Date of Birth/Sex: Treating RN: 1950-11-10 (71 y.o. [name], [name] Primary Care [name]: [name] Other Clinician: Referring [name]: Treating [name]/Extender: [name] in Treatment: 140 Debridement Performed for Assessment: Wound #12 Left Ischial Tuberosity Performed By: Physician Fredirick Maudlin, MD Debridement Type: Debridement Level of Consciousness (Pre-procedure): Awake and Alert Pre-procedure Verification/Time Out Yes - 11:50 Taken: Start Time: 11:55 Pain Control: Other : benzocaine 20% spray T Area Debrided (L x W): otal 5.5 (cm) x 8 (cm) = 44 (cm) Tissue and other material debrided: Viable, Non-Viable, Eschar, Slough, Subcutaneous, Slough Level: Skin/Subcutaneous Tissue Debridement Description: Excisional Instrument: Curette Bleeding: Minimum Hemostasis Achieved:  Pressure Procedural Pain: 0 Post Procedural Pain: 0 Response to Treatment: Procedure was tolerated well Level of Consciousness (Post- Awake and Alert procedure): Post Debridement Measurements of Total Wound Length: (cm) 5.5 Stage: Category/Stage IV Width: (cm) 8 Depth: (cm) 1.8 Volume: (cm) 62.204 Character of Wound/Ulcer Post Debridement: Requires Further Debridement Post Procedure Diagnosis Same as Pre-procedure Electronic Signature(s) Signed: 09/11/2021 2:37:03 PM By: Fredirick Maudlin MD FACS Signed: 09/11/2021 5:06:14 PM By: Baruch Gouty RN, BSN Entered By: Baruch Gouty on 09/11/2021 11:59:20 -------------------------------------------------------------------------------- Debridement Details Patient Name: Date of Service: [name]. 09/11/2021 10:30 A M Medical Record Number: [id number] Patient Account Number: [id number] Date of Birth/Sex: Treating RN: December 12, 1950 (71 y.o. [name], [name] Primary Care [name]: [name] Other Clinician: Referring [name]: Treating [name]/Extender: [name] in Treatment: 140 Debridement Performed for Assessment: Wound #15 Right Ischium Performed By: Physician Fredirick Maudlin, MD Debridement Type: Debridement Level of Consciousness (Pre-procedure): Awake and Alert Pre-procedure Verification/Time Out Yes - 11:50 Taken: Start Time: 11:55 Pain Control: Other : benzocaine 20% spray T Area Debrided (L x W): otal 3 (cm) x 2.5 (cm) = 7.5 (cm) Tissue and other material debrided: Viable, Non-Viable, Eschar, Slough, Subcutaneous, Slough Level: Skin/Subcutaneous Tissue Debridement Description: Excisional Instrument: Curette Bleeding: Minimum Hemostasis Achieved: Pressure Procedural Pain: 0 Post Procedural Pain: 0 Response to Treatment: Procedure was tolerated well Level of Consciousness (Post- Awake and Alert procedure): Post Debridement Measurements of Total Wound Length: (cm) 3.5 Stage:  Unstageable/Unclassified Width: (cm) 2.5 Depth: (cm) 1.1 Volume: (cm) 7.559 Character of Wound/Ulcer Post Debridement: Requires Further Debridement Post Procedure Diagnosis Same as Pre-procedure Electronic Signature(s) Signed: 09/11/2021 2:37:03 PM By: Fredirick Maudlin MD FACS Signed: 09/11/2021 5:06:14 PM By: Baruch Gouty RN, BSN Entered By: Baruch Gouty on 09/11/2021 12:02:35 -------------------------------------------------------------------------------- HPI Details Patient Name: Date of Service: [name]. 09/11/2021 10:30 A M Medical Record Number: [id number] Patient Account Number: [id number] Date of Birth/Sex: Treating RN: 1950-10-14 (71 y.o. [name]:  [name] Other Clinician: Referring [name]: Treating [name]/Extender: [name] in Treatment: 140 History of Present Illness Location: open ulceration of the left gluteal area, left heel and right ankle for about 5 months. Quality: Patient reports No Pain. Severity: Patient states wound(s) are getting worse. Duration: Patient has had the wound for > 5 months prior to seeking treatment at the wound center Context: The wound occurred when the patient has been paraplegic for about 3 years. Modifying Factors: Wound improving due to current treatment. ssociated Signs and Symptoms: Patient reports having foul odor. A HPI Description: this 71 year old patient who is known to have hypertension, hypothyroidism, breast cancer, chronic pain syndrome, paraplegia was noted to have a left gluteal decubitus ulcer and was brought into the hospital. During the course of her hospitalization she was debrided in the operating room by ankle wound. Bone cultures were taken at that time but were negative but clinically she was treated for osteomyelitis because of the probing down to bone and open exposed bone. Home health has been giving her antibioticss which include vancomycin  and Zosyn. The patient was a smoker until about 3 weeks ago and used to smoke about 10 cigarettes a day for a long while. 12/13/2014 - details of her operative note from 11/03/2014 were reviewed -- PROCEDURE: 1. Excisional debridement skin, subcutaneous, muscle left ischium 73 cm2 2. Excisional debridement skin, subcutaneous tissue left heel 27 cm2 3. Excisional debridement right ankle skin, subcutaneous, bone 30 cm2 01/24/2015 -- she has some issues with her wheelchair cushion but other than that is doing very well and has received Podus boots for her feet. 02/14/2015 -- she was using her old offloading boots and this seemed to have caused her a new pressure ulcer on the left posterior heel near the superior part just below the Achilles tendon. 03/07/2015 -- she has a new ulceration just to the left of the midline on her sacral region more on the left buttock and this has been there for Dr. Leland Johns and had all the wounds sharply debrided. The debridement was done for the left ischial wound, the left heel wound and the right about a week. 08/22/2015 -- was recently admitted to hospital between May 5 and 08/13/2015, with sepsis and leukocytosis due to a UTI. she was treated for a sepsis complicating Escherichia coli UTI and kidney stones. She also had metabolic and careful up at the secondary to pyelonephritis. He received broad-spectrum antibiotics initially and then received Macrobid as per urology. She was sent home on nitrofurantoin. during her admission she had a CT scan which showed exposed left ischial tuberosity without evidence of osteolysis. 09/12/2015-- the patient is having some issues with her air mattress and would like to get a opinion from medical modalities. 10/10/2015 -- the issue with her air mattress has not yet been sorted out and the new problem seems to be a lot of odor from the wound VAC. 11/27/2015 -- the patient was admitted to the hospital between July 23 and 10/31/2015. Her  problems were sepsis, osteomyelitis of the pelvic bone and acute pyelonephritis. CT of the abdomen and pelvis was consistent with a left-sided pyelonephritis with hydronephrosis and also just showed new sclerosis of the posterior portion of the left anterior pubic ramus suggestive of periosteal reaction consistent with osteomyelitis. She was treated for the osteomyelitis with infectious disease consult recommending 6 weeks of IV antibiotics including vancomycin and Rocephin and the antibiotics were to go on until 12/10/2015. He was seen by  Dr. Iran Planas plastic surgery and Dr. Linus Salmons of infectious disease. She had a suprapubic catheter placed during the admission. CT scan done on 10/28/2015 showed specifically -- New sclerosis of the posterior portion of the left inferior pubic ramus with aggressive periosteal reaction, consistent with osteomyelitis, with adjacent soft tissue gas compatible with previously described decubitus ulcer. 12/12/2015 -- she was recently seen by Dr. Linus Salmons, who noted good improvement and CRP and ESR compared to before and he has stopped her antibiotic as per plans to finish on September 4. The patient was encouraged to continue with wound care and consider hyperbaric oxygen therapy. Today she tells me that she has consented to undergo hyperbaric oxygen therapy and we can start the paperwork. 01/02/2016 -- her PCP had gained about 3 years but she still persists in having problems during hyperbaric oxygen therapy with some discomfort in the ears. 01/09/16; pressure area with underlying osteomyelitis in the left buttock. Wound bed itself has some slight amount of grayish surface slough however I do not think any debridement was necessary. There is no exposed bone soft tissue appears stable. She is using a wound VAC 01/16/16; back for weekly wound review in conjunction with HBO. She has a deep wound over the left initial tuberosity previously treated with 6 weeks of IV antibiotics  for osteomyelitis. Wound bed looks reasonably healthy although the base of this is still precariously close to bone. She has been using a wound VAC. 01/23/2016 -- she has completed her course of antibiotics and this week the only new thing is her right great toe nail was avulsed and she has got an open wound over the nailbed. 01/31/16 she has completed her course of antibiotics. Her right great toenail avulsed last week and she's been using silver alginate for this as well. Still using a wound VAC to the substantial stage IV wound over the left ischial tuberosity 03/05/2016 -- the patient has had a opinion from the plastic surgery group at [facility] and details of this are not available yet but the patient's verbal report has been heard by me. Did not sound like there was any optimistic discussion regarding reconstruction and the net result would be to continue with the wound VAC application. I will await the official reports. Addendum: -- she was seen at [facility] by Dr. Tressa Busman. After a thorough review and from what I understand spending 45 minutes with the patient his assessment has been noted by me in detail and the management options were: 1. Continued pressure offloading and wound care versus operative procedures including wound excision 2. Soft tissue and bone sampling 3. If the wound gets larger wound closure would be done using a variety of plastic surgical techniques including but not limited to skin substitute, possible skin graft, local versus regional flaps, negative pressure dressing application. 4. He discussed with her details of flap surgery and the risks associated 5. He made a comment that since the patient was operated on by Dr. Leland Johns of [facility] plastic surgery unit in [place] the patient may continue to follow-up there for further evaluation for surgical flap closure in the future. 03/19/2016 -- the patient  continues to be rather depressed and frustrated with her lack of rapid progress in healing this wound especially because she thought after hyperbaric oxygen therapy the wound would heal extremely fast. She now understands that was not the implied benefit on wound care which was the recommendation for hyperbaric oxygen therapy. I  have had a lengthy discussion with the patient and her husband regarding her options: 1. Continue with collagen and wound VAC for the primary dressing and offloading and all supportive care. 2. See Dr. Iran Planas for possible placement of Acell or Integra in the OR. 3. get a second opinion from a wound care center and surrounding regions/counties 05/07/2016 -- Note from Dr. Celedonio Miyamoto, who noted that the patient has declined flap surgery. She has discussed application of A cell, and try a few applications to see how the wound progresses. She is also recommended that we could apply products here in the wound center, like Oasis. during her preop workup it was found that her hemoglobin A1c was 11% and she has now been diagnosed as having diabetes mellitus and has been put on appropriate treatment by her PCP 05/28/2016 -- tells me her blood sugars have been doing well and she has an appointment to see her PCP in the next couple of weeks to check her hemoglobin A1c. Other than that she continues to do well. 06/25/2016 -- have not seen her back for the last month but she says her health has been about the same and she has an appointment to check the A1c next week 09/10/16 ---- was seen by Dr. Celedonio Miyamoto -- who applied Acell and saw her back in follow-up. She has recommended silver alginate to the wound every other day and cover with foam. If no significant drainage could transition to collagen every other day. She recommended discontinuing wound VAC. There were no plans to repeat application of Acell. The patient expressed that her husband could do the wound care as going  to the Wound Ctr., would cost several $100 for each visit. 10/21/2016 -- her insurance company is getting her new mattress and she is pleased about that. Other than that she has been doing dressings with PolyMem Silver and has been doing very well 02/18/2017 -- she has gone through several changes of her mattress and has not been pleased with any of them. The ventricles are still working on trying to fit her with the appropriate low air-loss mattress. She has a new wound on the gluteal area which is clearly separated from the original wound. 03/25/17-she is here in follow-up evaluation for her left ischialpressure ulcer. She remains unsatisfied with her pressure mattress. She admits to sitting multiple hours a day, in the bed. We have discussed offloading options. The wound does not appear infected. Nutrition does not appear to be a concern. Will follow-up in 4 weeks, if wound continues to be stalled may consider x-ray to evaluate for refractory osteomyelitis. 04/21/17; this is a patient that I don't know all that well. She has a chronic wound which at one point had underlying osteomyelitis in the left ischial tuberosity. This is a stage IV pressure ulcer. Over the last 3 months she has a stage II wound inferiorly to the original wound. The last time she was here her dressing was changed to silver collagen although the patient's husband who changes the dressing said that the collagen stuck to the wound and remove skin from the superficial area therefore he switched back to [place] 05/13/17; this is a patient we've been following for a left ischial tuberosity wound which was stage IV at one point had underlying osteomyelitis. Over the last several months she's had a stage II wound just inferior and medial to the related to the wound. According to her husband he is using Endoform layer with collagen although  this is not what I had last time. According to her husband they are using Elgie Congo with  collagen although I don't quite know how that started. She was hospitalized from 1/20 through 04/30/16. This was related to a UTI. Her blood cultures were negative, urine culture showed multiple species. She did have a CT scan of the abdomen and pelvis which documented chronic osteomyelitis in the area of the wound inflammatory markers were unremarkable. She has had prior knowledge of osteomyelitis. It looks as though she received IV antibiotics in 2017 and was treated with a course of hyperbaric oxygen. 05/28/17; the wound over the left ischial tuberosity is deeper today and abuts clearly on bone. Nursing intake reported drainage. I therefore culture of the wound. The more superficial area just below this looks about the same. They once again complained that there are mattress cover is not working although apparently [facility] is been noted to see this many times in the report is that the device is functional 06/18/17; the patient had a probing area on the left ischial tuberosity that was draining purulent fluid last time. This also clearly seemed to have open bone. Culture I did showed pansensitive pseudomonas including third generation cephalosporins. I treated this with cefdinir 300 twice a day for 10 days and things seem to have improved. She has a more superficial wound just underneath this area. Amazingly she has a new air mattress through [facility]. I think they gave this to her as a parking give. In any case this now works according to the patient may have something to do with why the areas are looking better. 07/09/17; the patient has a probing area in the left ischial tuberosity that still has some depth. However this is contracted in terms of the wound orifice although the depth is still roughly the same. There is no undermining. She also has the satellite wound which is more superficial. This appears to have a healthy surface we've been using silver collagen 08/06/17; the  patient's wound is over the left ischial tuberosity and a satellite lesion just underneath this. The original wound was actually a deep stage 4 wound. We have made good progress in 2 months and there is no longer exposed bone here. 09/03/17; left ischial tuberosity actually appears to be quite healthy. I think we are making progress. No debridement is required. There is no surrounding erythema 10/01/17 I follow this patient monthly for her left ischial tuberosity wound. There is 2 areas the original area and a satellite area. The satellite area looks a lot better there is no surrounding erythema. Her husband relates that he is having trouble maintaining the dressing. This has to do with the soft tissue around it. He states he puts the collagen in but he cannot make sure that it stays in even with the ABD pads and tape that he is been using 10/29/17; patient arrives with a better looking noon today. Some of the satellite lesions have closed. using Prisma 11/26/17; the patient has a large cone-shaped area with the tip of the Cone deep within her buttock soft tissue. The walls of the Cone are epithelialized however the base is still open. The area at the base of this looks moist we've been using silver collagen. Will change to silver alginate 12/31/2017; the wound appears to have come in fairly nicely. Using silver alginate. There is no surrounding maceration or infection 01/28/18; there is still an open area here over the left initial tuberosity. Base of  this however looks healthy. There is no surrounding infection 02/25/18; the area of its open is over the left ischial tuberosity. The base of this is where the wound is. This is a large inverted cone-shaped area with the wound at the tip. Dimensions of the wound at the tip are improved. There is a area of denuded skin about halfway towards the tip which her husband thinks may have happened today when he was bathing her. 04/20/17; the area is still open over  the left initial tuberosity. 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 This is now completely closed over although it is closed over with a divot and skin over bone at the base of this. Continued aggressive offloading will be necessary. 12/30/2018 READMISSION This is a 71 year old woman with chronic paraplegia. I picked her up for her care from Dr. Con Memos in this clinic after he departed. She had a stage IV pressure wound over the left ischial tuberosity. She was treated twice for her underlying osteomyelitis and this I believe firstly in 2016 and again in 2017. There were some plans at some point for flap closure of this however she was discovered to have uncontrolled diabetes and I do not think this was ever accomplished. She ultimately healed over in this clinic and was discharged in May. She has a large cone-shaped indentation with the tip of this going towards the left ischial tuberosity. It is not an easy area to examine but at that time I thought all of this was epithelialized. Apparently there was a reopening here shortly after she left the clinic last time. She was admitted to hospital at the end of June for Klebsiella bacteremia felt to be secondary to UTI. A CT scan of the pelvis is listed below and there was initially some concern that she had underlying osteomyelitis although I believe she was seen by infectious disease and that was felt to be not the case: I do not see any new cultures or inflammatory markers IMPRESSION: 1. No CT evidence for acute intra-abdominal or pelvic abnormality. Large volume of stool throughout the colon. 2. Enlarged fatty liver with fat sparing near the gallbladder fossa 3. Cortical scarring right kidney. Bilateral intrarenal stones without hydronephrosis.  Thick-walled urinary bladder decompressed by suprapubic catheter 4. Deep left decubitus ulcer with underlying left ischial changes suggesting osteomyelitis. Her husband has been using silver collagen in the wound. She has not been systemically unwell no fever chills eating and drinking well. They rigorously offload this wound only getting up in the wheelchair when she is going to appointments the rest of the time she is in bed. 10/8; wound measures larger and she now has exposed bone. We have been using silver alginate 11/12 still using silver alginate. The patient saw Dr. Megan Salon of infectious disease. She was started on Augmentin 500 mg twice daily. She is due to follow-up with Dr. Megan Salon I believe next week. Lab work Dr. Megan Salon requested showed a sedimentation rate of 28 and CRP of 20 although her CRP 1 year ago was 18.8. Sedimentation rate 1 year ago was 11 basic metabolic panel showed a creatinine of 1.12 12/3; the patient followed up with Dr. Megan Salon yesterday. She is still on Augmentin twice daily. This was directed by Dr. Megan Salon. The patient's inflammatory markers have improved which  is gratifying. Her C-reactive protein was repeated yesterday and follow-up booked with infectious disease in January. In addition I have been getting secure text messages I think from palliative care through [facility] [employer]. I think they were hoping to provide services to the patient in her home. They could not get a hold of the primary physician and so they reached out to me on 2 separate occasions. 12/17; patient last saw Dr. Megan Salon on 12/2. She is finishing up with Augmentin. Her C-reactive protein was 20 on 10/21, 10.1 on 11/19 and 17 on 12/2. The wound itself still has depth and undermining. We are using Santyl with the backing wet-to-dry 04/27/2019. The wound is gradually clearing up in terms of the surface although it is not filled in that much. Still abuts right against  bone 2/4; patient with a deep pressure ulcer over the left ischial tuberosity. I thought she was going to follow-up with infectious disease to follow her inflammatory markers although the patient states that they stated that they did not need to see her unless we felt it was necessary. I will need to check their notes. In any case we ordered moistened silver collagen back with wet-to-dry to fill in the depth of the wound although apparently [facility] sent silver alginate which they have been using since they were here the last time. Is obviously not what we ordered. 2/25. Not much change in this wound it is over the left ischial tuberosity recurrent wound. We have been using silver collagen with backing wet-to-dry. I think the wound is about the same. There is still some tunneling from about 10-12 o'clock over the ischial tuberosity itself 3/11; pressure ulcer over the left ischial tuberosity. Since she was last here the wound VAC was started and apparently going quite well. We are able to get the home health company that accepts [place] healthcare which is in itself sometimes problematic. There is been improvements in the wound the tunneling seems to be better and is contracted nicely 4/8; 1 month follow-up. Since she was last here we have been using silver collagen under a wound VAC. Some minor contraction I think in wound volume. She is cared for diligently by her husband including pressure relief, incontinence management, nutritional support etc. 6/1; this is almost a 109-month follow-up. She is been using silver collagen under wound VAC. Circular area over the left ischial tuberosity. She has been using silver collagen under wound VAC 7/8; 1 month follow-up. Silver collagen under the VAC not really a lot of progress. Tissue at the base of the wound which is right against bone and the tissue next that this does not look completely viable. She is not currently on any antibiotics, she had underlying  osteomyelitis I need to look this over 8/16; we are using silver collagen under wound VAC to the left ischial tuberosity wound. Comes in today with absolutely no change in surface area or depth. There is no exposed bone. I did look over her infectious disease notes as I said I would do last time. She last saw Dr. Megan Salon in December 2020. She completed 6 weeks of Augmentin. This was in response to a bone culture I did showing methicillin susceptible staph aureus and Enterococcus. She was supposed to come back to see Dr. Megan Salon at some point although they say that that appointment was canceled unless I chose to recommend return. I think there was supposed to be follow-up with inflammatory markers but I cannot see that that  was ever done. She has not been on antibiotics since 9/21; monthly follow-up. We received a call from home health nurse last evening to report green drainage coming out of the wound. Lab work I ordered last time showed a white count of 5.2 a sedimentation rate of 45 and a C-reactive protein of 25 however neither one of the 2 values are substantially different from her previous values in October 2020 or December 2020. Both are slightly higher but only marginally. Otherwise no new complaints from the patient or her husband 10/19; 1 month follow-up. PCR culture I did last time showed medium quantities of Pseudomonas lower quantities Klebsiella and Enterococcus faecalis group B strep and Peptostreptococcus. I gave her Augmentin for 2 weeks. I am not really sure of my choice of this I would not cover Pseudomonas. She is still having green drainage. Wound itself looks satisfactory there is not a lot of depth wound bed looks healthy 11/16; patient has completed the antibiotics still using gentamicin and silver alginate on the wound. There is improvement in the surface area 12/21; in general the area on the buttock looks somewhat better. Surface looks healthy although I do not know that  there is been much improvement in the wound volume. We have been using silver alginate and Hydrofera Blue. Less drainage. In passing the husband showed me an abrasion injury on the left anterior tibia. Covered in necrotic surface. He has noticed this for about a week and has been putting silver collagen on it. He is completely uncertain about how this happened 1/25; monthly follow-up. The area on the left buttock is about the same. This does not go to bone but a fairly deep wound surface of the wound is of questionable viability. The abrasion injury that they showed me last time apparently was closed out by home health because they thought it was healed but certainly is not although it is just about healed. As a result they haven't been applying anything to this area Finally I did discuss with the patient and her husband the idea of an advanced treatment product to try and get a proper base to this wound I was thinking of Puraply however actually the patient points out that her co-pay for coming to visit [place] i.e. the facility, charge would be unaffordable if they have to, on a weekly basis 2/22; pressure ulcer on the left buttock appears deeper to me and abuts on the ischial tuberosity. I thought initially there was exposed bone but there is a rim of tissue over this area. She also has a superficial over the right anterior mid tibia. Been using silver collagen to these areas without much success. I have looked over the patient's past history with regards to the area on the left ischium. She did have underlying osteomyelitis here dating back I think to late 2020. She saw Dr. Megan Salon she received a 6-week course of oral antibiotics in response to a bone culture that I did. This does not appear to be infected but it certainly has not been improving in terms of granulation. I do not believe she has had any recent imaging studies 3/29; 1 month follow-up. Pressure ulcer on the left buttock which she has  been dealing with with for a number of years. She was treated for underlying osteomyelitis at 1.2 or 3 years ago I think with infectious disease help. She also had I think a flap closure by Dr. Leland Johns and that lasted for about a year and then reopened. I  have not been able to get this patient to progress towards healing although truthfully the wound is absolutely no worse. We have been using Hydrofera Blue 4/26; patient presents for 1 week follow-up. She has been using silver collagen to the area every other day. She has home health that comes out once a week to help with dressing changes as well. The patient is interested in trying a skin substitute over this area. She states she is trying to relieve pressure off of it most of the day. 5/24; patient presents for 1 month follow-up. She has been using silver collagen to the area every other day. She has no complaints today. She is interested in the skin substitute. She tries to leave relieve pressure off Her bottom however is not able to most of the day 6/14; using silver collagen to the area over the left ischial tuberosity. Wound does not appear to be doing particularly well. Open to bone 6/28; the patient patient presented last time with a marked deterioration. Depth probing all the way to bone. The bone itself did not look particularly viable. In spite of this the x-ray I did showed longstanding ulcer over the left ischial tuberosity with chronic bone involvement/reaction that was also seen by CT in 2020. Lab work did not show just active infection with a sed rate of 14 and a C-reactive protein of 7.1. Her comprehensive metabolic panel was normal including an albumin of 4.1 white count was 6 7/19; patient was here 3 weeks ago with a marked deterioration in her wound over the left ischial tuberosity. I ordered a CT scan of the area for 1 reason or another this just did not get done. It is now booked for 8 days from now. She was supposed to come  back for bone culture and pathology. That did not happen either. We have been using silver collagen. As usual she is diligently looked after by her husband 8/24; 5-week follow-up. Since the patient was last here the biopsy that I did of her ischial tuberosity came back suggesting osteomyelitis. Culture showed strep. I started her on Augmentin. She was seen by infectious disease Dr. Lucianne Lei dam and the Augmentin was continued and she is still taking it. CT scan did not suggest anything other than chronic osteomyelitis without much change from her previous study. Her C-reactive protein was only 7 and sedimentation rate was 1.1. We are still using silver collagen to the wound. She has home health. Her husband is very diligent in her care for this reason I have never pursued a diverting colostomy. She would not agree to this surgery in any case. Finally we have discussed plastic surgery with him in the past and she is not interested in a myocutaneous flap. 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 Since she was last here she was in the ER last week with abdominal pain. She was found to be impacted however in the course of the review there somebody gave her some IV morphine and apparently she developed hives and blisters. There is still tense blisters on her plantar left fifth and fourth fingers 10/4; the patient has completed her Augmentin and is due to follow-up with Dr. Drucilla Schmidt in early November. Her wound today measures about the same but looks a little healthier in terms of granulation there is no exposed bone. I note that she was admitted to hospital for 2 days from 9/21 through 9/23 for delirium. She had received Versed for glaucoma surgery ultimately that was  felt to be the etiology. Her blood cultures were negative she had 30,000 colonies of Pseudomonas in her urine. She did receive broad-spectrum antibiotic therapy but ultimately her wound on the buttock was not felt to be the cause. As mentioned she  has completed her antibiotics still using silver collagen 11/1; patient has completed her antibiotics for the underlying osteomyelitis. She apparently follows up with Dr. Drucilla Schmidt next week. The wound does not look too bad perhaps slightly narrower in terms of width but the depth is about the same. We have been using Hydrofera Blue. The patient talks to me about a wound VAC and made it sound as though she was recently on 1 although I do not see this. The other option is an advanced treatment product like Oasis but that means weekly trips into the clinic. She has previously said she does not want an attempt at plastic surgery 11/15; the patient saw Dr. Drucilla Schmidt noted that her follow-up inflammatory markers normalized. She has completed her antibiotics. We are currently using Hydrofera Blue. Wound itself has some surface tissue over bone but certainly not a lot. I have looked back over her history. The patient has had recurrent osteomyelitis in this area as she is received prolonged courses of antibiotics. We did use a wound VAC for a prolonged period of time in 2021. We had some improvement especially in undermining areas but overall not a lot of measurable improvement. Once again I have tried to think about using advanced treatment products in this area something that would require them visiting the clinic very frequently and they did not seem to want to do this. Most recently we ran [place] however she would have a $250 per application co-pay 53/9; left ischial tuberosity. I do not see much difference in 3 weeks. There is no exposed bone. We are using Hydrofera Blue. Our intake nurse reports some "greenish" drainage 12/21 left ischial tuberosity. Measuring slightly smaller in surface area. The wound still has some tissue over bone i.e. there is no exposed bone. No overt infection. We did a deep tissue culture last time for PCR. The major bacteria was Pseudomonas although there were medium titers of  Enterococcus faecalis Klebsiella E. coli and Morganella as well as Peptostreptococcus. Keystone antibiotic include streptomycin and vancomycin they got this last weekend and used it twice 04/17/2021; no change in this wound. It does not have undermining however the surface is of it does not look particularly viable. We have been using topical antibiotic directed at a PCR culture as well as silver alginate. It is clear in talking to the patient and her husband that she does not offload this area adequately including spending all night on this with I think a level 2 surface on her bed. I have told her that this is not adequate to heal a wound like this.. 2/15; Oasis with a $767 co-pay per application. They are using silver alginate to the wound offloading as best they can according to her husband 06/19/2021: At her last visit, Dr. Dellia Nims changed her to silver collagen, but apparently they did not receive this until just last week. They have continued using silver alginate in the wound. Today, the wound is quite malodorous with necrotic slough. They ran out of [place] topical about 2 months ago. No new cultures have been taken.. 07/17/2021: At the last visit, the wound was in pretty poor condition with a lot of necrotic slough and substantial malodorous drainage. I did take a new culture for PCR, but  for some reason this was never resulted. Nonetheless, they did receive a renewal of their [employer] apparently it was communicated to them that they should use silver alginate rather than the silver collagen. Regardless, the wound is much cleaner at this visit and there is no odor. 07/26/2021: The sacral/left ischial ulcer is fairly clean, but there was some greenish drainage appreciated on the dressing. They have been using topical Keystone with silver alginate. She has unfortunately developed a new ulcer on her right ischium. It is unstageable with a thick layer of eschar. It is malodorous. 08/14/2021: I took a  new PCR culture at her last visit from the new wound that had opened on her right ischial tuberosity. This returned with a polymicrobial population. A new topical Keystone compound was formulated. They have been using this on her wounds along with silver alginate. I also prescribed a course of oral antibiotic, Augmentin and Levaquin to try and address the multiple species with various resistance these that grew out. She was able to take this for about 10 days, but it has started to make her nauseated and she is actually thrown up on occasion. Since she takes them at the same time, she is not sure which one is causing her issue. She has also been having a lot more drainage from her left ischial wound. Her husband says that he has not been using the zinc oxide as much because it is difficult to wash off when he bathes her. We have been using Santyl to the new wound under silver alginate and silver alginate to the left ischial wound. 09/11/2021: Unfortunately, there has been fairly substantial breakdown of both of her wounds. It sounds like the gel compound that the [employer] was being mixed in just leaked out everywhere and left the patient wet and macerated. There is worsening necrotic tissue and increased depth on the right ischial wound and the left one has extended further. They have not been using the zinc oxide as recommended. They contacted [facility] and were advised to simply apply the powder directly to the wounds. Electronic Signature(s) Signed: 09/11/2021 12:13:08 PM By: Fredirick Maudlin MD FACS Entered By: Fredirick Maudlin on 09/11/2021 12:13:08 -------------------------------------------------------------------------------- Physical Exam Details Patient Name: Date of Service: [name]. 09/11/2021 10:30 A M Medical Record Number: [id number] Patient Account Number: [id number] Date of Birth/Sex: Treating RN: January 18, 1951 (71 y.o. [name]: [name] Other  Clinician: Referring [name]: Treating [name]/Extender: Burgess Amor Weeks in Treatment: 140 Constitutional . . . . No acute distress.Marland Kitchen Respiratory Normal work of breathing on room air.. Notes 09/11/2021: The wound on her right ischial tuberosity is deeper today with accumulated necrotic tissue. The wound on the left ischial tuberosity has extended and now involves more of her superficial skin as well as being deeper and larger at the primary site. There is devascularized tissue present on much of the surface. No significant odor appreciated. The periwound skin looks as though it has been macerated fairly recently, but looks drier today. Electronic Signature(s) Signed: 09/11/2021 12:14:41 PM By: Fredirick Maudlin MD FACS Entered By: Fredirick Maudlin on 09/11/2021 12:14:41 -------------------------------------------------------------------------------- Physician Orders Details Patient Name: Date of Service: [name], [name]. 09/11/2021 10:30 A M Medical Record Number: [id number] Patient Account Number: [id number] Date of Birth/Sex: Treating RN: Jun 29, 1950 (70 y.o. [name]: [name] Other Clinician: Referring [name]: Treating [name]/Extender: [name] in Treatment: 140 Verbal / Phone Orders: No  Diagnosis Coding ICD-10 Coding Code Description 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, stage 4 E11.622 Type 2 diabetes mellitus with other skin ulcer L89.310 Pressure ulcer of right buttock, unstageable Follow-up Appointments Return appointment in 1 month. - Dr. Celine Ahr - Room 1 Bathing/ Shower/ Hygiene May shower and wash wound with soap and water. - with dressing changes Edema Control - Lymphedema / SCD / Other Elevate legs to the level of the heart or above for 30 minutes daily and/or when sitting, a frequency of: - throughout the day Off-Loading Turn and reposition every 2 hours - *** Try to shift from side to  side and shift to belly (if tolerated),****avoid sitting up in bed except for meals Home Health New wound care orders this week; continue Home Health for wound care. May utilize formulary equivalent dressing for wound treatment orders unless otherwise specified. Dressing changes to be completed by Home Health on Monday / Wednesday / Friday except when patient has scheduled visit at [facility]. - increase visits to 3 times per week (spouse having difficulty with dressing changes) Other Home Health Orders/Instructions: - [facility] Wound Treatment Wound #12 - Ischial Tuberosity Wound Laterality: Left Cleanser: Soap and Water 1 x Per Day/30 Days Discharge Instructions: May shower and wash wound with dial antibacterial soap and water prior to dressing change. Cleanser: Wound Cleanser 1 x Per Day/30 Days Discharge Instructions: Cleanse the wound with wound cleanser prior to applying a clean dressing using gauze sponges, not tissue or cotton balls. Peri-Wound Care: Zinc Oxide Ointment 30g tube 1 x Per Day/30 Days Discharge Instructions: Apply Zinc Oxide to periwound with each dressing change Peri-Wound Care: Skin Prep 1 x Per Day/30 Days Discharge Instructions: Use skin prep as directed Topical: meropimen powder 1 x Per Day/30 Days Discharge Instructions: to wound bed Prim Dressing: KerraCel Ag Gelling Fiber Dressing, 4x5 in (silver alginate) (DME) (Generic) 1 x Per Day/30 Days ary Discharge Instructions: Apply silver alginate to wound bed as instructed Secondary Dressing: Woven Gauze Sponge, Non-Sterile 4x4 in (DME) (Generic) 1 x Per Day/30 Days Discharge Instructions: Apply over primary dressing as directed. Secondary Dressing: Zetuvit Plus Silicone Border Dressing 5x5 (in/in) (DME) (Generic) 1 x Per Day/30 Days Discharge Instructions: Apply silicone border over primary dressing as directed. Wound #15 - Ischium Wound Laterality: Right Cleanser: Soap and Water 1 x Per Day/30  Days Discharge Instructions: May shower and wash wound with dial antibacterial soap and water prior to dressing change. Cleanser: Wound Cleanser 1 x Per Day/30 Days Discharge Instructions: Cleanse the wound with wound cleanser prior to applying a clean dressing using gauze sponges, not tissue or cotton balls. Peri-Wound Care: Zinc Oxide Ointment 30g tube 1 x Per Day/30 Days Discharge Instructions: Apply Zinc Oxide to periwound with each dressing change Peri-Wound Care: Skin Prep 1 x Per Day/30 Days Discharge Instructions: Use skin prep as directed Topical: meropimen powder 1 x Per Day/30 Days Discharge Instructions: to wound bed Prim Dressing: KerraCel Ag Gelling Fiber Dressing, 4x5 in (silver alginate) (DME) (Generic) 1 x Per Day/30 Days ary Discharge Instructions: Apply silver alginate to wound bed as instructed Secondary Dressing: Woven Gauze Sponge, Non-Sterile 4x4 in (DME) (Generic) 1 x Per Day/30 Days Discharge Instructions: Apply over primary dressing as directed. Secondary Dressing: Zetuvit Plus Silicone Border Dressing 5x5 (in/in) (DME) (Generic) 1 x Per Day/30 Days Discharge Instructions: Apply silicone border or ABD padover primary dressing as directed. Electronic Signature(s) Signed: 09/13/2021 5:13:26 PM By: Baruch Gouty RN, BSN Signed: 09/16/2021 7:32:38 AM By:  Fredirick Maudlin MD FACS Previous Signature: 09/11/2021 2:37:03 PM Version By: Fredirick Maudlin MD FACS Entered By: Baruch Gouty on 09/13/2021 17:11:27 -------------------------------------------------------------------------------- Problem List Details Patient Name: Date of Service: [name]. 09/11/2021 10:30 A M Medical Record Number: [id number] Patient Account Number: [id number] Date of Birth/Sex: Treating RN: [birth year] (72 y.o. [name]: [name] Other Clinician: Referring [name]: Treating [name]/Extender: Burgess Amor Weeks in Treatment:  140 Active Problems ICD-10 Encounter Code Description Active Date MDM Diagnosis 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, stage 4 12/30/2018 No Yes E11.622 Type 2 diabetes mellitus with other skin ulcer 12/30/2018 No Yes L89.310 Pressure ulcer of right buttock, unstageable 07/26/2021 No Yes Inactive Problems ICD-10 Code Description Active Date Inactive Date G82.20 Paraplegia, unspecified 12/30/2018 12/30/2018 M86.68 Other chronic osteomyelitis, other site 02/17/2019 02/17/2019 S80.811D Abrasion, right lower leg, subsequent encounter 03/27/2020 03/27/2020 M86.68 Other chronic osteomyelitis, other site 11/28/2020 11/28/2020 L97.811 Non-pressure chronic ulcer of other part of right lower leg limited to breakdown of skin 03/27/2020 03/27/2020 Resolved Problems Electronic Signature(s) Signed: 09/11/2021 12:05:21 PM By: Fredirick Maudlin MD FACS Entered By: Fredirick Maudlin on 09/11/2021 12:05:21 -------------------------------------------------------------------------------- Progress Note Details Patient Name: Date of Service: [name]. 09/11/2021 10:30 A M Medical Record Number: [id number] Patient Account Number: [id number] Date of Birth/Sex: Treating RN: 1950-10-28 (71 y.o. [name], [name] Primary Care [name]: [name] Other Clinician: Referring [name]: Treating [name]/Extender: [name] in Treatment: 140 Subjective Chief Complaint Information obtained from Patient She is here for follow up evaluation of left ischial pressure ulcer 12/30/2018; patient comes back for review of wounds in the same general area over the previously healed left ischial pressure ulcer History of Present Illness (HPI) The following HPI elements were documented for the patient's wound: Location: open ulceration of the left gluteal area, left heel and right ankle for about 5 months. Quality: Patient reports No Pain. Severity: Patient states wound(s) are getting  worse. Duration: Patient has had the wound for > 5 months prior to seeking treatment at the wound center Context: The wound occurred when the patient has been paraplegic for about 3 years. Modifying Factors: Wound improving due to current treatment. Associated Signs and Symptoms: Patient reports having foul odor. this 71 year old patient who is known to have hypertension, hypothyroidism, breast cancer, chronic pain syndrome, paraplegia was noted to have a left gluteal decubitus ulcer and was brought into the hospital. During the course of her hospitalization she was debrided in the operating room by ankle wound. Bone cultures were taken at that time but were negative but clinically she was treated for osteomyelitis because of the probing down to bone and open exposed bone. Home health has been giving her antibioticss which include vancomycin and Zosyn. The patient was a smoker until about 3 weeks ago and used to smoke about 10 cigarettes a day for a long while. 12/13/2014 - details of her operative note from 11/03/2014 were reviewed -- PROCEDURE: 1. Excisional debridement skin, subcutaneous, muscle left ischium 73 cm2 2. Excisional debridement skin, subcutaneous tissue left heel 27 cm2 3. Excisional debridement right ankle skin, subcutaneous, bone 30 cm2 01/24/2015 -- she has some issues with her wheelchair cushion but other than that is doing very well and has received Podus boots for her feet. 02/14/2015 -- she was using her old offloading boots and this seemed to have caused her a new pressure ulcer on the left posterior heel near the superior part just below the  Achilles tendon. 03/07/2015 -- she has a new ulceration just to the left of the midline on her sacral region more on the left buttock and this has been there for Dr. Leland Johns and had all the wounds sharply debrided. The debridement was done for the left ischial wound, the left heel wound and the right about a week. 08/22/2015 -- was  recently admitted to hospital between May 5 and 08/13/2015, with sepsis and leukocytosis due to a UTI. she was treated for a sepsis complicating Escherichia coli UTI and kidney stones. She also had metabolic and careful up at the secondary to pyelonephritis. He received broad-spectrum antibiotics initially and then received Macrobid as per urology. She was sent home on nitrofurantoin. during her admission she had a CT scan which showed exposed left ischial tuberosity without evidence of osteolysis. 09/12/2015-- the patient is having some issues with her air mattress and would like to get a opinion from medical modalities. 10/10/2015 -- the issue with her air mattress has not yet been sorted out and the new problem seems to be a lot of odor from the wound VAC. 11/27/2015 -- the patient was admitted to the hospital between July 23 and 10/31/2015. Her problems were sepsis, osteomyelitis of the pelvic bone and acute pyelonephritis. CT of the abdomen and pelvis was consistent with a left-sided pyelonephritis with hydronephrosis and also just showed new sclerosis of the posterior portion of the left anterior pubic ramus suggestive of periosteal reaction consistent with osteomyelitis. She was treated for the osteomyelitis with infectious disease consult recommending 6 weeks of IV antibiotics including vancomycin and Rocephin and the antibiotics were to go on until 12/10/2015. He was seen by Dr. Iran Planas plastic surgery and Dr. Linus Salmons of infectious disease. She had a suprapubic catheter placed during the admission. CT scan done on 10/28/2015 showed specifically -- New sclerosis of the posterior portion of the left inferior pubic ramus with aggressive periosteal reaction, consistent with osteomyelitis, with adjacent soft tissue gas compatible with previously described decubitus ulcer. 12/12/2015 -- she was recently seen by Dr. Linus Salmons, who noted good improvement and CRP and ESR compared to before and he has  stopped her antibiotic as per plans to finish on September 4. The patient was encouraged to continue with wound care and consider hyperbaric oxygen therapy. Today she tells me that she has consented to undergo hyperbaric oxygen therapy and we can start the paperwork. 01/02/2016 -- her PCP had gained about 3 years but she still persists in having problems during hyperbaric oxygen therapy with some discomfort in the ears. 01/09/16; pressure area with underlying osteomyelitis in the left buttock. Wound bed itself has some slight amount of grayish surface slough however I do not think any debridement was necessary. There is no exposed bone soft tissue appears stable. She is using a wound VAC 01/16/16; back for weekly wound review in conjunction with HBO. She has a deep wound over the left initial tuberosity previously treated with 6 weeks of IV antibiotics for osteomyelitis. Wound bed looks reasonably healthy although the base of this is still precariously close to bone. She has been using a wound VAC. 01/23/2016 -- she has completed her course of antibiotics and this week the only new thing is her right great toe nail was avulsed and she has got an open wound over the nailbed. 01/31/16 she has completed her course of antibiotics. Her right great toenail avulsed last week and she's been using silver alginate for this as well. 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 Did not sound like there was any optimistic discussion regarding reconstruction and the net result would be to continue with the wound VAC application. I will await the official reports. Addendum: -- she was seen at [facility] by Dr. Tressa Busman. After a thorough review  and from what I understand spending 45 minutes with the patient his assessment has been noted by me in detail and the management options were: 1. Continued pressure offloading and wound care versus operative procedures including wound excision 2. Soft tissue and bone sampling 3. If the wound gets larger wound closure would be done using a variety of plastic surgical techniques including but not limited to skin substitute, possible skin graft, local versus regional flaps, negative pressure dressing application. 4. He discussed with her details of flap surgery and the risks associated 5. He made a comment that since the patient was operated on by Dr. Leland Johns of [facility] plastic surgery unit in [place] the patient may continue to follow-up there for further evaluation for surgical flap closure in the future. 03/19/2016 -- the patient continues to be rather depressed and frustrated with her lack of rapid progress in healing this wound especially because she thought after hyperbaric oxygen therapy the wound would heal extremely fast. She now understands that was not the implied benefit on wound care which was the recommendation for hyperbaric oxygen therapy. I have had a lengthy discussion with the patient and her husband regarding her options: 1. Continue with collagen and wound VAC for the primary dressing and offloading and all supportive care. 2. See Dr. Iran Planas for possible placement of Acell or Integra in the OR. 3. get a second opinion from a wound care center and surrounding regions/counties 05/07/2016 -- Note from Dr. Celedonio Miyamoto, who noted that the patient has declined flap surgery. She has discussed application of A cell, and try a few applications to see how the wound progresses. She is also recommended that we could apply products here in the wound center, like Oasis. during her preop workup it was found that her hemoglobin A1c was 11% and she has now been diagnosed  as having diabetes mellitus and has been put on appropriate treatment by her PCP 05/28/2016 -- tells me her blood sugars have been doing well and she has an appointment to see her PCP in the next couple of weeks to check her hemoglobin A1c. Other than that she continues to do well. 06/25/2016 -- have not seen her back for the last month but she says her health has been about the same and she has an appointment to check the A1c next week 09/10/16 ---- was seen by Dr. Celedonio Miyamoto -- who applied Acell and saw her back in follow-up. She has recommended silver alginate to the wound every other day and cover with foam. If no significant drainage could transition to collagen every other day. She recommended discontinuing wound VAC. There were no plans to repeat application of Acell. The patient expressed that her husband could do the wound care as going to the Wound Ctr[facility] would cost several $100 for each visit. 10/21/2016 -- her insurance company is getting her new mattress and she is pleased about that. Other than  that she has been doing dressings with PolyMem Silver and has been doing very well 02/18/2017 -- she has gone through several changes of her mattress and has not been pleased with any of them. The ventricles are still working on trying to fit her with the appropriate low air-loss mattress. She has a new wound on the gluteal area which is clearly separated from the original wound. 03/25/17-she is here in follow-up evaluation for her left ischialpressure ulcer. She remains unsatisfied with her pressure mattress. She admits to sitting multiple hours a day, in the bed. We have discussed offloading options. The wound does not appear infected. Nutrition does not appear to be a concern. Will follow-up in 4 weeks, if wound continues to be stalled may consider x-ray to evaluate for refractory osteomyelitis. 04/21/17; this is a patient that I don't know all that well. She has a chronic wound which at one  point had underlying osteomyelitis in the left ischial tuberosity. This is a stage IV pressure ulcer. Over the last 3 months she has a stage II wound inferiorly to the original wound. The last time she was here her dressing was changed to silver collagen although the patient's husband who changes the dressing said that the collagen stuck to the wound and remove skin from the superficial area therefore he switched back to [place] 05/13/17; this is a patient we've been following for a left ischial tuberosity wound which was stage IV at one point had underlying osteomyelitis. Over the last several months she's had a stage II wound just inferior and medial to the related to the wound. According to her husband he is using Endoform layer with collagen although this is not what I had last time. According to her husband they are using Elgie [place] with collagen although I don't quite know how that started. She was hospitalized from 1/20 through 04/30/16. This was related to a UTI. Her blood cultures were negative, urine culture showed multiple species. She did have a CT scan of the abdomen and pelvis which documented chronic osteomyelitis in the area of the wound inflammatory markers were unremarkable. She has had prior knowledge of osteomyelitis. It looks as though she received IV antibiotics in 2017 and was treated with a course of hyperbaric oxygen. 05/28/17; the wound over the left ischial tuberosity is deeper today and abuts clearly on bone. Nursing intake reported drainage. I therefore culture of the wound. The more superficial area just below this looks about the same. They once again complained that there are mattress cover is not working although apparently [facility] is been noted to see this many times in the report is that the device is functional 06/18/17; the patient had a probing area on the left ischial tuberosity that was draining purulent fluid last time. This also clearly seemed to  have open bone. Culture I did showed pansensitive pseudomonas including third generation cephalosporins. I treated this with cefdinir 300 twice a day for 10 days and things seem to have improved. She has a more superficial wound just underneath this area. Amazingly she has a new air mattress through [facility]. I think they gave this to her as a parking give. In any case this now works according to the patient may have something to do with why the areas are looking better. 07/09/17; the patient has a probing area in the left ischial tuberosity that still has some depth. However this is contracted in terms of the wound orifice although the depth is  still roughly the same. There is no undermining. ooShe also has the satellite wound which is more superficial. This appears to have a healthy surface we've been using silver collagen 08/06/17; the patient's wound is over the left ischial tuberosity and a satellite lesion just underneath this. The original wound was actually a deep stage 4 wound. We have made good progress in 2 months and there is no longer exposed bone here. 09/03/17; left ischial tuberosity actually appears to be quite healthy. I think we are making progress. No debridement is required. There is no surrounding erythema 10/01/17 I follow this patient monthly for her left ischial tuberosity wound. There is 2 areas the original area and a satellite area. The satellite area looks a lot better there is no surrounding erythema. Her husband relates that he is having trouble maintaining the dressing. This has to do with the soft tissue around it. He states he puts the collagen in but he cannot make sure that it stays in even with the ABD pads and tape that he is been using 10/29/17; patient arrives with a better looking noon today. Some of the satellite lesions have closed. using Prisma 11/26/17; the patient has a large cone-shaped area with the tip of the Cone deep within her buttock soft  tissue. The walls of the Cone are epithelialized however the base is still open. The area at the base of this looks moist we've been using silver collagen. Will change to silver alginate 12/31/2017; the wound appears to have come in fairly nicely. Using silver alginate. There is no surrounding maceration or infection 01/28/18; there is still an open area here over the left initial tuberosity. Base of this however looks healthy. There is no surrounding infection 02/25/18; the area of its open is over the left ischial tuberosity. The base of this is where the wound is. This is a large inverted cone-shaped area with the wound at the tip. Dimensions of the wound at the tip are improved. There is a area of denuded skin about halfway towards the tip which her husband thinks may have happened today when he was bathing her. 04/20/17; the area is still open over the left initial tuberosity. 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 This is now completely closed over although it is closed over with a divot and skin over bone at the base of this. Continued aggressive offloading will be necessary. 12/30/2018 READMISSION This is a 71 year old woman with chronic paraplegia. I picked her up for her care from Dr. Con Memos in this clinic after he departed. She had a stage IV pressure wound over the left ischial tuberosity. She was treated twice for her underlying osteomyelitis and this I believe firstly in 2016 and again in 2017. There were some plans at some point for flap closure of this however she was discovered to have uncontrolled diabetes and I do not think this was ever accomplished. She ultimately healed over in this clinic and was discharged in May. She has a large cone-shaped indentation with the tip of this going  towards the left ischial tuberosity. It is not an easy area to examine but at that time I thought all of this was epithelialized. Apparently there was a reopening here shortly after she left the clinic last time. She was admitted to  hospital at the end of June for Klebsiella bacteremia felt to be secondary to UTI. A CT scan of the pelvis is listed below and there was initially some concern that she had underlying osteomyelitis although I believe she was seen by infectious disease and that was felt to be not the case: I do not see any new cultures or inflammatory markers IMPRESSION: 1. No CT evidence for acute intra-abdominal or pelvic abnormality. Large volume of stool throughout the colon. 2. Enlarged fatty liver with fat sparing near the gallbladder fossa 3. Cortical scarring right kidney. Bilateral intrarenal stones without hydronephrosis. Thick-walled urinary bladder decompressed by suprapubic catheter 4. Deep left decubitus ulcer with underlying left ischial changes suggesting osteomyelitis. Her husband has been using silver collagen in the wound. She has not been systemically unwell no fever chills eating and drinking well. They rigorously offload this wound only getting up in the wheelchair when she is going to appointments the rest of the time she is in bed. 10/8; wound measures larger and she now has exposed bone. We have been using silver alginate 11/12 still using silver alginate. The patient saw Dr. Megan Salon of infectious disease. She was started on Augmentin 500 mg twice daily. She is due to follow-up with Dr. Megan Salon I believe next week. Lab work Dr. Megan Salon requested showed a sedimentation rate of 28 and CRP of 20 although her CRP 1 year ago was 18.8. Sedimentation rate 1 year ago was 11 basic metabolic panel showed a creatinine of 1.12 12/3; the patient followed up with Dr. Megan Salon yesterday. She is still on Augmentin twice daily. This was directed by Dr. Megan Salon. The  patient's inflammatory markers have improved which is gratifying. Her C-reactive protein was repeated yesterday and follow-up booked with infectious disease in January. In addition I have been getting secure text messages I think from palliative care [facility] [employer]. I think they were hoping to provide services to the patient in her home. They could not get a hold of the primary physician and so they reached out to me on 2 separate occasions. 12/17; patient last saw Dr. Megan Salon on 12/2. She is finishing up with Augmentin. Her C-reactive protein was 20 on 10/21, 10.1 on 11/19 and 17 on 12/2. The wound itself still has depth and undermining. We are using Santyl with the backing wet-to-dry 04/27/2019. The wound is gradually clearing up in terms of the surface although it is not filled in that much. Still abuts right against bone 2/4; patient with a deep pressure ulcer over the left ischial tuberosity. I thought she was going to follow-up with infectious disease to follow her inflammatory markers although the patient states that they stated that they did not need to see her unless we felt it was necessary. I will need to check their notes. In any case we ordered moistened silver collagen back with wet-to-dry to fill in the depth of the wound although apparently [facility] sent silver alginate which they have been using since they were here the last time. Is obviously not what we ordered. 2/25. Not much change in this wound it is over the left ischial tuberosity recurrent wound. We have been using silver collagen with backing wet-to-dry. I think the wound is about the same. There is still some tunneling from about 10-12 o'clock over the ischial tuberosity itself 3/11; pressure ulcer over the left ischial tuberosity. Since she was last here the wound VAC was started and apparently going quite well. We are able  to get the home health company that accepts [place] healthcare which is  in itself sometimes problematic. There is been improvements in the wound the tunneling seems to be better and is contracted nicely 4/8; 1 month follow-up. Since she was last here we have been using silver collagen under a wound VAC. Some minor contraction I think in wound volume. She is cared for diligently by her husband including pressure relief, incontinence management, nutritional support etc. 6/1; this is almost a 56-month follow-up. She is been using silver collagen under wound VAC. Circular area over the left ischial tuberosity. She has been using silver collagen under wound VAC 7/8; 1 month follow-up. Silver collagen under the VAC not really a lot of progress. Tissue at the base of the wound which is right against bone and the tissue next that this does not look completely viable. She is not currently on any antibiotics, she had underlying osteomyelitis I need to look this over 8/16; we are using silver collagen under wound VAC to the left ischial tuberosity wound. Comes in today with absolutely no change in surface area or depth. There is no exposed bone. I did look over her infectious disease notes as I said I would do last time. She last saw Dr. Megan Salon in December 2020. She completed 6 weeks of Augmentin. This was in response to a bone culture I did showing methicillin susceptible staph aureus and Enterococcus. She was supposed to come back to see Dr. Megan Salon at some point although they say that that appointment was canceled unless I chose to recommend return. I think there was supposed to be follow-up with inflammatory markers but I cannot see that that was ever done. She has not been on antibiotics since 9/21; monthly follow-up. We received a call from home health nurse last evening to report green drainage coming out of the wound. Lab work I ordered last time showed a white count of 5.2 a sedimentation rate of 45 and a C-reactive protein of 25 however neither one of the 2 values are  substantially different from her previous values in October 2020 or December 2020. Both are slightly higher but only marginally. Otherwise no new complaints from the patient or her husband 10/19; 1 month follow-up. PCR culture I did last time showed medium quantities of Pseudomonas lower quantities Klebsiella and Enterococcus faecalis group B strep and Peptostreptococcus. I gave her Augmentin for 2 weeks. I am not really sure of my choice of this I would not cover Pseudomonas. She is still having green drainage. Wound itself looks satisfactory there is not a lot of depth wound bed looks healthy 11/16; patient has completed the antibiotics still using gentamicin and silver alginate on the wound. There is improvement in the surface area 12/21; in general the area on the buttock looks somewhat better. Surface looks healthy although I do not know that there is been much improvement in the wound volume. We have been using silver alginate and Hydrofera Blue. Less drainage. In passing the husband showed me an abrasion injury on the left anterior tibia. Covered in necrotic surface. He has noticed this for about a week and has been putting silver collagen on it. He is completely uncertain about how this happened 1/25; monthly follow-up. The area on the left buttock is about the same. This does not go to bone but a fairly deep wound surface of the wound is of questionable viability. The abrasion injury that they showed me last time apparently was closed out by home  health because they thought it was healed but certainly is not although it is just about healed. As a result they haven't been applying anything to this area Finally I did discuss with the patient and her husband the idea of an advanced treatment product to try and get a proper base to this wound I was thinking of Puraply however actually the patient points out that her co-pay for coming to visit [place] i.e. the facility, charge would be unaffordable if  they have to, on a weekly basis 2/22; pressure ulcer on the left buttock appears deeper to me and abuts on the ischial tuberosity. I thought initially there was exposed bone but there is a rim of tissue over this area. She also has a superficial over the right anterior mid tibia. Been using silver collagen to these areas without much success. I have looked over the patient's past history with regards to the area on the left ischium. She did have underlying osteomyelitis here dating back I think to late 2020. She saw Dr. Megan Salon she received a 6-week course of oral antibiotics in response to a bone culture that I did. This does not appear to be infected but it certainly has not been improving in terms of granulation. I do not believe she has had any recent imaging studies 3/29; 1 month follow-up. Pressure ulcer on the left buttock which she has been dealing with with for a number of years. She was treated for underlying osteomyelitis at 1.2 or 3 years ago I think with infectious disease help. She also had I think a flap closure by Dr. Leland Johns and that lasted for about a year and then reopened. I have not been able to get this patient to progress towards healing although truthfully the wound is absolutely no worse. We have been using Hydrofera Blue 4/26; patient presents for 1 week follow-up. She has been using silver collagen to the area every other day. She has home health that comes out once a week to help with dressing changes as well. The patient is interested in trying a skin substitute over this area. She states she is trying to relieve pressure off of it most of the day. 5/24; patient presents for 1 month follow-up. She has been using silver collagen to the area every other day. She has no complaints today. She is interested in the skin substitute. She tries to leave relieve pressure off Her bottom however is not able to most of the day 6/14; using silver collagen to the area over the left  ischial tuberosity. Wound does not appear to be doing particularly well. Open to bone 6/28; the patient patient presented last time with a marked deterioration. Depth probing all the way to bone. The bone itself did not look particularly viable. In spite of this the x-ray I did showed longstanding ulcer over the left ischial tuberosity with chronic bone involvement/reaction that was also seen by CT in 2020. Lab work did not show just active infection with a sed rate of 14 and a C-reactive protein of 7.1. Her comprehensive metabolic panel was normal including an albumin of 4.1 white count was 6 7/19; patient was here 3 weeks ago with a marked deterioration in her wound over the left ischial tuberosity. I ordered a CT scan of the area for 1 reason or another this just did not get done. It is now booked for 8 days from now. She was supposed to come back for bone culture and pathology. That did not  happen either. We have been using silver collagen. As usual she is diligently looked after by her husband 8/24; 5-week follow-up. Since the patient was last here the biopsy that I did of her ischial tuberosity came back suggesting osteomyelitis. Culture showed strep. I started her on Augmentin. She was seen by infectious disease Dr. Lucianne Lei dam and the Augmentin was continued and she is still taking it. CT scan did not suggest anything other than chronic osteomyelitis without much change from her previous study. Her C-reactive protein was only 7 and sedimentation rate was 1.1. We are still using silver collagen to the wound. She has home health. Her husband is very diligent in her care for this reason I have never pursued a diverting colostomy. She would not agree to this surgery in any case. Finally we have discussed plastic surgery with him in the past and she is not interested in a myocutaneous flap. 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 Since she was last here she was in the ER last week with abdominal  pain. She was found to be impacted however in the course of the review there somebody gave her some IV morphine and apparently she developed hives and blisters. There is still tense blisters on her plantar left fifth and fourth fingers 10/4; the patient has completed her Augmentin and is due to follow-up with Dr. Drucilla Schmidt in early November. Her wound today measures about the same but looks a little healthier in terms of granulation there is no exposed bone. I note that she was admitted to hospital for 2 days from 9/21 through 9/23 for delirium. She had received Versed for glaucoma surgery ultimately that was felt to be the etiology. Her blood cultures were negative she had 30,000 colonies of Pseudomonas in her urine. She did receive broad-spectrum antibiotic therapy but ultimately her wound on the buttock was not felt to be the cause. As mentioned she has completed her antibiotics still using silver collagen 11/1; patient has completed her antibiotics for the underlying osteomyelitis. She apparently follows up with Dr. Drucilla Schmidt next week. The wound does not look too bad perhaps slightly narrower in terms of width but the depth is about the same. We have been using Hydrofera Blue. The patient talks to me about a wound VAC and made it sound as though she was recently on 1 although I do not see this. The other option is an advanced treatment product like Oasis but that means weekly trips into the clinic. She has previously said she does not want an attempt at plastic surgery 11/15; the patient saw Dr. Drucilla Schmidt noted that her follow-up inflammatory markers normalized. She has completed her antibiotics. We are currently using Hydrofera Blue. Wound itself has some surface tissue over bone but certainly not a lot. I have looked back over her history. The patient has had recurrent osteomyelitis in this area as she is received prolonged courses of antibiotics. We did use a wound VAC for a prolonged period of time in  2021. We had some improvement especially in undermining areas but overall not a lot of measurable improvement. Once again I have tried to think about using advanced treatment products in this area something that would require them visiting the clinic very frequently and they did not seem to want to do this. Most recently we ran Segundo however she would have a $620 per application co-pay 35/5; left ischial tuberosity. I do not see much difference in 3 weeks. There is no exposed bone.  We are using Hydrofera Blue. Our intake nurse reports some "greenish" drainage 12/21 left ischial tuberosity. Measuring slightly smaller in surface area. The wound still has some tissue over bone i.e. there is no exposed bone. No overt infection. We did a deep tissue culture last time for PCR. The major bacteria was Pseudomonas although there were medium titers of Enterococcus faecalis Klebsiella E. coli and Morganella as well as Peptostreptococcus. Keystone antibiotic include streptomycin and vancomycin they got this last weekend and used it twice 04/17/2021; no change in this wound. It does not have undermining however the surface is of it does not look particularly viable. We have been using topical antibiotic directed at a PCR culture as well as silver alginate. It is clear in talking to the patient and her husband that she does not offload this area adequately including spending all night on this with I think a level 2 surface on her bed. I have told her that this is not adequate to heal a wound like this.. 2/15; Oasis with a $540 co-pay per application. They are using silver alginate to the wound offloading as best they can according to her husband 06/19/2021: At her last visit, Dr. Dellia Nims changed her to silver collagen, but apparently they did not receive this until just last week. They have continued using silver alginate in the wound. Today, the wound is quite malodorous with necrotic slough. They ran out of Somers  topical about 2 months ago. No new cultures have been taken.. 07/17/2021: At the last visit, the wound was in pretty poor condition with a lot of necrotic slough and substantial malodorous drainage. I did take a new culture for PCR, but for some reason this was never resulted. Nonetheless, they did receive a renewal of their [employer] apparently it was communicated to them that they should use silver alginate rather than the silver collagen. Regardless, the wound is much cleaner at this visit and there is no odor. 07/26/2021: The sacral/left ischial ulcer is fairly clean, but there was some greenish drainage appreciated on the dressing. They have been using topical Keystone with silver alginate. She has unfortunately developed a new ulcer on her right ischium. It is unstageable with a thick layer of eschar. It is malodorous. 08/14/2021: I took a new PCR culture at her last visit from the new wound that had opened on her right ischial tuberosity. This returned with a polymicrobial population. A new topical Keystone compound was formulated. They have been using this on her wounds along with silver alginate. I also prescribed a course of oral antibiotic, Augmentin and Levaquin to try and address the multiple species with various resistance these that grew out. She was able to take this for about 10 days, but it has started to make her nauseated and she is actually thrown up on occasion. Since she takes them at the same time, she is not sure which one is causing her issue. She has also been having a lot more drainage from her left ischial wound. Her husband says that he has not been using the zinc oxide as much because it is difficult to wash off when he bathes her. We have been using Santyl to the new wound under silver alginate and silver alginate to the left ischial wound. 09/11/2021: Unfortunately, there has been fairly substantial breakdown of both of her wounds. It sounds like the gel compound that the  [employer] was being mixed in just leaked out everywhere and left the patient wet and macerated. There  is worsening necrotic tissue and increased depth on the right ischial wound and the left one has extended further. They have not been using the zinc oxide as recommended. They contacted [facility] and were advised to simply apply the powder directly to the wounds. Patient History Information obtained from Patient. Family History Unknown History, Cancer - Mother, Diabetes - Maternal Grandparents, Heart Disease - Mother, Hypertension - Mother, Thyroid Problems - Mother, No family history of Hereditary Spherocytosis, Kidney Disease, Lung Disease, Seizures, Stroke, Tuberculosis. Social History Former smoker - ended on 11/06/2014, Marital Status - Married, Alcohol Use - Never, Drug Use - No History, Caffeine Use - Daily - T soda. ea, Medical History Eyes Denies history of Cataracts, Glaucoma, Optic Neuritis Ear/Nose/Mouth/Throat Denies history of Chronic sinus problems/congestion, Middle ear problems Hematologic/Lymphatic Patient has history of Anemia Denies history of Hemophilia, Human Immunodeficiency Virus, Lymphedema, Sickle Cell Disease Respiratory Denies history of Aspiration, Asthma, Chronic Obstructive Pulmonary Disease (COPD), Pneumothorax, Sleep Apnea, Tuberculosis Cardiovascular Patient has history of Hypertension Denies history of Angina, Arrhythmia, Congestive Heart Failure, Coronary Artery Disease, Deep Vein Thrombosis, Hypotension, Myocardial Infarction, Peripheral Arterial Disease, Peripheral Venous Disease, Phlebitis, Vasculitis Gastrointestinal Denies history of Cirrhosis , Colitis, Crohnoos, Hepatitis A, Hepatitis B, Hepatitis C Endocrine Patient has history of Type II Diabetes Denies history of Type I Diabetes Genitourinary Denies history of End Stage Renal Disease Immunological Denies history of Lupus Erythematosus, Raynaudoos, Scleroderma Integumentary (Skin) Denies  history of History of Burn Musculoskeletal Patient has history of Osteoarthritis Denies history of Gout, Rheumatoid Arthritis, Osteomyelitis Neurologic Patient has history of Dementia, Paraplegia Denies history of Neuropathy, Quadriplegia Oncologic Patient has history of Received Radiation - 2012 - right breast Psychiatric Denies history of Anorexia/bulimia, Confinement Anxiety Hospitalization/Surgery History - left knee replacement. - right breast lumpectomy. - cervical laminectomy with fusion. - hysterectomy. - tear duct surgery. - T and A. - UTI. - suprapubic cath placement. - UTI. Medical A Surgical History Notes nd Gastrointestinal constipation GERD Genitourinary UTI kidney stones neurogenic bladder Integumentary (Skin) left gluteal fold sacral Musculoskeletal cervical neuropathy osteomyelitis Psychiatric admitted for suicide risk on 12/02/2018 Objective Constitutional No acute distress.. Vitals Time Taken: 11:07 AM, Height: 63 in, Weight: 185 lbs, BMI: 32.8, Temperature: 97.8 F, Pulse: 78 bpm, Respiratory Rate: 18 breaths/min, Blood Pressure: 118/70 mmHg. Respiratory Normal work of breathing on room air.. General Notes: 09/11/2021: The wound on her right ischial tuberosity is deeper today with accumulated necrotic tissue. The wound on the left ischial tuberosity has extended and now involves more of her superficial skin as well as being deeper and larger at the primary site. There is devascularized tissue present on much of the surface. No significant odor appreciated. The periwound skin looks as though it has been macerated fairly recently, but looks drier today. Integumentary (Hair, Skin) Wound #12 status is Open. Original cause of wound was Pressure Injury. The date acquired was: 09/06/2018. The wound has been in treatment 140 weeks. The wound is located on the Left Ischial Tuberosity. The wound measures 5.5cm length x 8cm width x 1.8cm depth; 34.558cm^2 area and 62.204cm^3  volume. There is Fat Layer (Subcutaneous Tissue) exposed. There is no tunneling noted, however, there is undermining starting at 4:00 and ending at 6:00 with a maximum distance of 2.2cm. There is a large amount of serosanguineous drainage noted. The wound margin is well defined and not attached to the wound base. There is small (1-33%) red, pink granulation within the wound bed. There is a large (67-100%) amount of necrotic tissue within  the wound bed including Eschar and Adherent Slough. Wound #15 status is Open. Original cause of wound was Pressure Injury. The date acquired was: 07/21/2021. The wound has been in treatment 6 weeks. The wound is located on the Right Ischium. The wound measures 3cm length x 2.5cm width x 1.1cm depth; 5.89cm^2 area and 6.48cm^3 volume. There is Fat Layer (Subcutaneous Tissue) exposed. There is no tunneling or undermining noted. There is a medium amount of serosanguineous drainage noted. The wound margin is flat and intact. There is no granulation within the wound bed. There is a large (67-100%) amount of necrotic tissue within the wound bed including Eschar and Adherent Slough. Assessment Active Problems ICD-10 Pressure ulcer of left buttock, stage 4 Type 2 diabetes mellitus with other skin ulcer Pressure ulcer of right buttock, unstageable Procedures Wound #12 Pre-procedure diagnosis of Wound #12 is a Pressure Ulcer located on the Left Ischial Tuberosity . There was a Excisional Skin/Subcutaneous Tissue Debridement with a total area of 44 sq cm performed by Fredirick Maudlin, MD. With the following instrument(s): Curette to remove Viable and Non-Viable tissue/material. Material removed includes Eschar, Subcutaneous Tissue, and Slough after achieving pain control using Other (benzocaine 20% spray). No specimens were taken. A time out was conducted at 11:50, prior to the start of the procedure. A Minimum amount of bleeding was controlled with Pressure. The procedure  was tolerated well with a pain level of 0 throughout and a pain level of 0 following the procedure. Post Debridement Measurements: 5.5cm length x 8cm width x 1.8cm depth; 62.204cm^3 volume. Post debridement Stage noted as Category/Stage IV. Character of Wound/Ulcer Post Debridement requires further debridement. Post procedure Diagnosis Wound #12: Same as Pre-Procedure Wound #15 Pre-procedure diagnosis of Wound #15 is a Pressure Ulcer located on the Right Ischium . There was a Excisional Skin/Subcutaneous Tissue Debridement with a total area of 7.5 sq cm performed by Fredirick Maudlin, MD. With the following instrument(s): Curette to remove Viable and Non-Viable tissue/material. Material removed includes Eschar, Subcutaneous Tissue, and Slough after achieving pain control using Other (benzocaine 20% spray). No specimens were taken. A time out was conducted at 11:50, prior to the start of the procedure. A Minimum amount of bleeding was controlled with Pressure. The procedure was tolerated well with a pain level of 0 throughout and a pain level of 0 following the procedure. Post Debridement Measurements: 3.5cm length x 2.5cm width x 1.1cm depth; 7.559cm^3 volume. Post debridement Stage noted as Unstageable/Unclassified. Character of Wound/Ulcer Post Debridement requires further debridement. Post procedure Diagnosis Wound #15: Same as Pre-Procedure Plan Follow-up Appointments: Return appointment in 1 month. - Dr. Celine Ahr - Room 1 Bathing/ Shower/ Hygiene: May shower and wash wound with soap and water. - with dressing changes Edema Control - Lymphedema / SCD / Other: Elevate legs to the level of the heart or above for 30 minutes daily and/or when sitting, a frequency of: - throughout the day Off-Loading: Turn and reposition every 2 hours - *** Try to shift from side to side and shift to belly (if tolerated),****avoid sitting up in bed except for meals Home Health: New wound care orders this week;  continue Home Health for wound care. May utilize formulary equivalent dressing for wound treatment orders unless otherwise specified. Dressing changes to be completed by [facility] on Monday / Wednesday / Friday except when patient has scheduled visit at [facility]. - increase visits to 3 times per week (spouse having difficulty with dressing changes) Other Home Health Orders/Instructions: - [phone]  [place] WOUND #12: - Ischial Tuberosity Wound Laterality: Left Cleanser: Soap and Water 1 x Per Day/30 Days Discharge Instructions: May shower and wash wound with dial antibacterial soap and water prior to dressing change. Cleanser: Wound Cleanser 1 x Per Day/30 Days Discharge Instructions: Cleanse the wound with wound cleanser prior to applying a clean dressing using gauze sponges, not tissue or cotton balls. Peri-Wound Care: Zinc Oxide Ointment 30g tube 1 x Per Day/30 Days Discharge Instructions: Apply Zinc Oxide to periwound with each dressing change Peri-Wound Care: Skin Prep 1 x Per Day/30 Days Discharge Instructions: Use skin prep as directed Topical: meropimen powder 1 x Per Day/30 Days Discharge Instructions: to wound bed Prim Dressing: KerraCel Ag Gelling Fiber Dressing, 4x5 in (silver alginate) (DME) (Generic) 1 x Per Day/30 Days ary Discharge Instructions: Apply silver alginate to wound bed as instructed Secondary Dressing: Woven Gauze Sponge, Non-Sterile 4x4 in (DME) (Generic) 1 x Per Day/30 Days Discharge Instructions: Apply over primary dressing as directed. Secondary Dressing: Zetuvit Plus Silicone Border Dressing 5x5 (in/in) (DME) (Generic) 1 x Per Day/30 Days Discharge Instructions: Apply silicone border over primary dressing as directed. WOUND #15: - Ischium Wound Laterality: Right Cleanser: Soap and Water 1 x Per Day/30 Days Discharge Instructions: May shower and wash wound with dial antibacterial soap and water prior to dressing change. Cleanser: Wound Cleanser 1 x Per  Day/30 Days Discharge Instructions: Cleanse the wound with wound cleanser prior to applying a clean dressing using gauze sponges, not tissue or cotton balls. Peri-Wound Care: Zinc Oxide Ointment 30g tube 1 x Per Day/30 Days Discharge Instructions: Apply Zinc Oxide to periwound with each dressing change Peri-Wound Care: Skin Prep 1 x Per Day/30 Days Discharge Instructions: Use skin prep as directed Topical: meropimen powder 1 x Per Day/30 Days Discharge Instructions: to wound bed Prim Dressing: KerraCel Ag Gelling Fiber Dressing, 4x5 in (silver alginate) (DME) (Generic) 1 x Per Day/30 Days ary Discharge Instructions: Apply silver alginate to wound bed as instructed Secondary Dressing: Woven Gauze Sponge, Non-Sterile 4x4 in (DME) (Generic) 1 x Per Day/30 Days Discharge Instructions: Apply over primary dressing as directed. Secondary Dressing: Zetuvit Plus Silicone Border Dressing 5x5 (in/in) (DME) (Generic) 1 x Per Day/30 Days Discharge Instructions: Apply silicone border or ABD padover primary dressing as directed. 09/11/2021: The wound on her right ischial tuberosity is deeper today with accumulated necrotic tissue. The wound on the left ischial tuberosity has extended and now involves more of her superficial skin as well as being deeper and larger at the primary site. There is devascularized tissue present on much of the surface. No significant odor appreciated. The periwound skin looks as though it has been macerated fairly recently, but looks drier today. I used a curette to debride all of the surfaces of her wounds, including slough and subcutaneous tissue. Hopefully the etiology of the deterioration has been appropriately identified and remedied with the change to applying the powder directly to her wounds. We will continue using silver alginate over the topical Keystone. They were encouraged to use zinc oxide ointment to protect the periwound skin. Follow-up in 1 month. Electronic  Signature(s) Signed: 09/13/2021 5:13:26 PM By: Baruch Gouty RN, BSN Signed: 09/16/2021 7:32:38 AM By: Fredirick Maudlin MD FACS Previous Signature: 09/11/2021 12:18:35 PM Version By: Fredirick Maudlin MD FACS Entered By: Baruch Gouty on 09/13/2021 17:11:51 -------------------------------------------------------------------------------- HxROS Details Patient Name: Date of Service: [name], [name]. 09/11/2021 10:30 A M Medical Record Number: [id number] Patient Account Number: [id number] Date of Birth/Sex: Treating  RN: 05-12-1950 (71 y.o. [name]: [name] Other Clinician: Referring [name]: Treating [name]/Extender: [name] in Treatment: 140 Information Obtained From Patient Eyes Medical History: Negative for: Cataracts; Glaucoma; Optic Neuritis Ear/Nose/Mouth/Throat Medical History: Negative for: Chronic sinus problems/congestion; Middle ear problems Hematologic/Lymphatic Medical History: Positive for: Anemia Negative for: Hemophilia; Human Immunodeficiency Virus; Lymphedema; Sickle Cell Disease Respiratory Medical History: Negative for: Aspiration; Asthma; Chronic Obstructive Pulmonary Disease (COPD); Pneumothorax; Sleep Apnea; Tuberculosis Cardiovascular Medical History: Positive for: Hypertension Negative for: Angina; Arrhythmia; Congestive Heart Failure; Coronary Artery Disease; Deep Vein Thrombosis; Hypotension; Myocardial Infarction; Peripheral Arterial Disease; Peripheral Venous Disease; Phlebitis; Vasculitis Gastrointestinal Medical History: Negative for: Cirrhosis ; Colitis; Crohns; Hepatitis A; Hepatitis B; Hepatitis C Past Medical History Notes: constipation GERD Endocrine Medical History: Positive for: Type II Diabetes Negative for: Type I Diabetes Time with diabetes: 1 year Treated with: Oral agents Blood sugar tested every day: No Genitourinary Medical History: Negative for: End Stage Renal  Disease Past Medical History Notes: UTI kidney stones neurogenic bladder Immunological Medical History: Negative for: Lupus Erythematosus; Raynauds; Scleroderma Integumentary (Skin) Medical History: Negative for: History of Burn Past Medical History Notes: left gluteal fold sacral Musculoskeletal Medical History: Positive for: Osteoarthritis Negative for: Gout; Rheumatoid Arthritis; Osteomyelitis Past Medical History Notes: cervical neuropathy osteomyelitis Neurologic Medical History: Positive for: Dementia; Paraplegia Negative for: Neuropathy; Quadriplegia Oncologic Medical History: Positive for: Received Radiation - 2012 - right breast Psychiatric Medical History: Negative for: Anorexia/bulimia; Confinement Anxiety Past Medical History Notes: admitted for suicide risk on 12/02/2018 Immunizations Pneumococcal Vaccine: Received Pneumococcal Vaccination: Yes Received Pneumococcal Vaccination On or After 60th Birthday: No Implantable Devices None Hospitalization / Surgery History Type of Hospitalization/Surgery left knee replacement right breast lumpectomy cervical laminectomy with fusion hysterectomy tear duct surgery Tand A UTI suprapubic cath placement UTI Family and Social History Unknown History: Yes; Cancer: Yes - Mother; Diabetes: Yes - Maternal Grandparents; Heart Disease: Yes - Mother; Hereditary Spherocytosis: No; Hypertension: Yes - Mother; Kidney Disease: No; Lung Disease: No; Seizures: No; Stroke: No; Thyroid Problems: Yes - Mother; Tuberculosis: No; Former smoker - ended on 11/06/2014; Marital Status - Married; Alcohol Use: Never; Drug Use: No History; Caffeine Use: Daily - T soda; Financial Concerns: No; ea, Food, Clothing or Shelter Needs: No; Support System Lacking: No; Transportation Concerns: No Engineer, maintenance) Signed: 09/11/2021 2:37:03 PM By: Fredirick Maudlin MD FACS Signed: 09/11/2021 5:06:14 PM By: Baruch Gouty RN, BSN Entered By:  Fredirick Maudlin on 09/11/2021 12:13:13 -------------------------------------------------------------------------------- SuperBill Details Patient Name: Date of Service: [name] 09/11/2021 Medical Record Number: [id number] Patient Account Number: [id number] Date of Birth/Sex: Treating RN: 1950-10-18 (71 y.o. [name]: [name] Other Clinician: Referring [name]: Treating [name]/Extender: Burgess Amor Weeks in Treatment: 140 Diagnosis Coding ICD-10 Codes Code Description [phone] Pressure ulcer of left buttock, stage 4 E11.622 Type 2 diabetes mellitus with other skin ulcer L89.310 Pressure ulcer of right buttock, unstageable Facility Procedures The patient participates with Medicare or their insurance follows the Medicare Facility Guidelines: CPT4 Code Description Modifier Quantity [id number] 11042 - DEB SUBQ TISSUE 20 SQ CM/< 1 ICD-10 Diagnosis Description 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,  stage 4 L89.310 Pressure ulcer of right buttock, unstageable The patient participates with Medicare or their insurance follows the Medicare Facility Guidelines: [id number] 11045 - DEB SUBQ TISS EA ADDL 20CM 2 ICD-10 Diagnosis Description 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, stage 4 L89.310 Pressure ulcer of right  buttock, unstageable Physician  Procedures : CPT4 Code Description Modifier [id number] - WC PHYS LEVEL 3 - EST PT 25 ICD-10 Diagnosis Description 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, stage 4 L89.310 Pressure ulcer of right buttock, unstageable E11.622 Type 2 diabetes mellitus with other  skin ulcer Quantity: 1 : [id number] - WC PHYS SUBQ TISS 20 SQ CM ICD-10 Diagnosis Description 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, stage 4 L89.310 Pressure ulcer of right buttock, unstageable Quantity: 1 : [id number] 11045 - WC PHYS SUBQ TISS EA ADDL 20 CM ICD-10 Diagnosis Description 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

## 2021-09-11 NOTE — Progress Notes (Signed)
[name], [name] ([id number]) Visit Report for 09/11/2021 Arrival Information Details Patient Name: Date of Service: [name], [name] 09/11/2021 10:30 A M Medical Record Number: [id number] Patient Account Number: [id number] Date of Birth/Sex: Treating RN: 1950/05/27 (71 y.o. Benjamine Sprague, Briant Cedar Primary Care Imaya Duffy: [name] Other Clinician: Referring Keeton Kassebaum: Treating Ioan Landini/Extender: Nance Pew in Treatment: 39 Visit Information History Since Last Visit Added or deleted any medications: No Patient Arrived: Wheel Chair Any new allergies or adverse reactions: No Arrival Time: 11:07 Had a fall or experienced change in No Accompanied By: husband activities of daily living that may affect Transfer Assistance: Manual risk of falls: Patient Identification Verified: Yes Signs or symptoms of abuse/neglect since last visito No Secondary Verification Process Completed: Yes Hospitalized since last visit: No Patient Requires Transmission-Based Precautions: No Implantable device outside of the clinic excluding No Patient Has Alerts: No cellular tissue based products placed in the center since last visit: Has Dressing in Place as Prescribed: Yes Pain Present Now: No Electronic Signature(s) Signed: 09/11/2021 5:22:20 PM By: Levan Hurst RN, BSN Entered By: Levan Hurst on 09/11/2021 11:08:04 -------------------------------------------------------------------------------- Encounter Discharge Information Details Patient Name: Date of Service: [name] Brunner. 09/11/2021 10:30 A M Medical Record Number: [id number] Patient Account Number: [id number] Date of Birth/Sex: Treating RN: 1951-01-08 (71 y.o. [name] Primary Care Kamorie Aldous: [name] Other Clinician: Referring Elgie Landino: Treating Marnisha Stampley/Extender: Nance Pew in Treatment: 140 Encounter Discharge Information Items Post Procedure Vitals Discharge Condition:  Stable Temperature (F): 97.8 Ambulatory Status: Wheelchair Pulse (bpm): 78 Discharge Destination: Home Respiratory Rate (breaths/min): 18 Transportation: Private Auto Blood Pressure (mmHg): 118/70 Accompanied By: spouse Schedule Follow-up Appointment: Yes Clinical Summary of Care: Patient Declined Electronic Signature(s) Signed: 09/11/2021 5:06:14 PM By: Baruch Gouty RN, BSN Entered By: Baruch Gouty on 09/11/2021 12:27:42 -------------------------------------------------------------------------------- Lower Extremity Assessment Details Patient Name: Date of Service: [name] Brunner. 09/11/2021 10:30 A M Medical Record Number: [id number] Patient Account Number: [id number] Date of Birth/Sex: Treating RN: 07-Aug-1950 (71 y.o. [name] Primary Care Marsel Gail: [name] Other Clinician: Referring Devell Parkerson: Treating Amenda Duclos/Extender: Burgess Amor Weeks in Treatment: 140 Electronic Signature(s) Signed: 09/11/2021 5:06:14 PM By: Baruch Gouty RN, BSN Entered By: Baruch Gouty on 09/11/2021 11:54:18 -------------------------------------------------------------------------------- Multi Wound Chart Details Patient Name: Date of Service: [name], [name] Breach. 09/11/2021 10:30 A M Medical Record Number: [id number] Patient Account Number: [id number] Date of Birth/Sex: Treating RN: 1951/03/10 (71 y.o. Martyn Malay, Linda Primary Care Amil Moseman: [name] Other Clinician: Referring Felicite Zeimet: Treating Shereda Graw/Extender: Nance Pew in Treatment: 140 Vital Signs Height(in): 85 Pulse(bpm): 17 Weight(lbs): 185 Blood Pressure(mmHg): 118/70 Body Mass Index(BMI): 32.8 Temperature(F): 97.8 Respiratory Rate(breaths/min): 18 Photos: [N/A:N/A] Left Ischial Tuberosity Right Ischium N/A Wound Location: Pressure Injury Pressure Injury N/A Wounding Event: Pressure Ulcer Pressure Ulcer N/A Primary Etiology: Anemia, Hypertension, Type II Anemia,  Hypertension, Type II N/A Comorbid History: Diabetes, Osteoarthritis, Dementia, Diabetes, Osteoarthritis, Dementia, Paraplegia, Received Radiation Paraplegia, Received Radiation 09/06/2018 07/21/2021 N/A Date Acquired: 140 6 N/A Weeks of Treatment: Open Open N/A Wound Status: No No N/A Wound Recurrence: 5.5x8x1.8 3x2.5x1.1 N/A Measurements L x W x D (cm) 34.558 5.89 N/A A (cm) : rea 62.204 6.48 N/A Volume (cm) : -14543.20% 64.50% N/A % Reduction in A rea: -29241.50% -290.10% N/A % Reduction in Volume: 4 Starting Position 1 (o'clock): 6 Ending Position 1 (o'clock): 2.2 Maximum Distance 1 (cm): Yes No N/A Undermining: Category/Stage IV Unstageable/Unclassified N/A Classification: Large Medium  N/A Exudate A mount: Serosanguineous Serosanguineous N/A Exudate Type: red, brown red, brown N/A Exudate Color: Well defined, not attached Flat and Intact N/A Wound Margin: Small (1-33%) None Present (0%) N/A Granulation Amount: Red, Pink N/A N/A Granulation Quality: Large (67-100%) Large (67-100%) N/A Necrotic Amount: Eschar, Adherent Slough Eschar, Adherent Slough N/A Necrotic Tissue: Fat Layer (Subcutaneous Tissue): Yes Fat Layer (Subcutaneous Tissue): Yes N/A Exposed Structures: Fascia: No Fascia: No Tendon: No Tendon: No Muscle: No Muscle: No Joint: No Joint: No Bone: No Bone: No Small (1-33%) Small (1-33%) N/A Epithelialization: Debridement - Excisional Debridement - Excisional N/A Debridement: Pre-procedure Verification/Time Out 11:50 11:50 N/A Taken: Other Other N/A Pain Control: Necrotic/Eschar, Subcutaneous, Necrotic/Eschar, Subcutaneous, N/A Tissue Debrided: [employer] Skin/Subcutaneous Tissue Skin/Subcutaneous Tissue N/A Level: 44 7.5 N/A Debridement A (sq cm): rea Curette Curette N/A Instrument: Minimum Minimum N/A Bleeding: Pressure Pressure N/A Hemostasis Achieved: 0 0 N/A Procedural Pain: 0 0 N/A Post Procedural  Pain: Debridement Treatment Response: Procedure was tolerated well Procedure was tolerated well N/A Post Debridement Measurements L x 5.5x8x1.8 3.5x2.5x1.1 N/A W x D (cm) 62.204 7.559 N/A Post Debridement Volume: (cm) Category/Stage IV Unstageable/Unclassified N/A Post Debridement Stage: Debridement Debridement N/A Procedures Performed: Treatment Notes Electronic Signature(s) Signed: 09/11/2021 12:05:34 PM By: Fredirick Maudlin MD FACS Signed: 09/11/2021 5:06:14 PM By: Baruch Gouty RN, BSN Entered By: Fredirick Maudlin on 09/11/2021 12:05:34 -------------------------------------------------------------------------------- Multi-Disciplinary Care Plan Details Patient Name: Date of Service: [name], [name] Breach. 09/11/2021 10:30 A M Medical Record Number: [id number] Patient Account Number: [id number] Date of Birth/Sex: Treating RN: 05-Nov-1950 (71 y.o. [name] Primary Care Sheniya Garciaperez: [name] Other Clinician: Referring Diedre Maclellan: Treating Gwendalyn Mcgonagle/Extender: Nance Pew in Treatment: Douglas reviewed with physician Active Inactive Pressure Nursing Diagnoses: Knowledge deficit related to management of pressures ulcers Potential for impaired tissue integrity related to pressure, friction, moisture, and shear Goals: Patient/caregiver will verbalize understanding of pressure ulcer management Date Initiated: 05/22/2021 Target Resolution Date: 10/10/2021 Goal Status: Active Interventions: Assess: immobility, friction, shearing, incontinence upon admission and as needed Assess offloading mechanisms upon admission and as needed Notes: Wound/Skin Impairment Nursing Diagnoses: Knowledge deficit related to ulceration/compromised skin integrity Goals: Patient/caregiver will verbalize understanding of skin care regimen Date Initiated: 12/30/2018 Target Resolution Date: 10/10/2021 Goal Status: Active Interventions: Assess patient/caregiver  ability to obtain necessary supplies Assess patient/caregiver ability to perform ulcer/skin care regimen upon admission and as needed Assess ulceration(s) every visit Provide education on ulcer and skin care Treatment Activities: Skin care regimen initiated : 12/30/2018 Topical wound management initiated : 12/30/2018 Notes: 01/08/21: Wound care regimen continues Electronic Signature(s) Signed: 09/11/2021 5:06:14 PM By: Baruch Gouty RN, BSN Entered By: Baruch Gouty on 09/11/2021 12:00:15 -------------------------------------------------------------------------------- Pain Assessment Details Patient Name: Date of Service: [name] Brunner. 09/11/2021 10:30 A M Medical Record Number: [id number] Patient Account Number: [id number] Date of Birth/Sex: Treating RN: 07/02/1950 (71 y.o. [name] Primary Care Skii Cleland: [name] Other Clinician: Referring Kynlee Koenigsberg: Treating Odeth Bry/Extender: Burgess Amor Weeks in Treatment: 140 Active Problems Location of Pain Severity and Description of Pain Patient Has Paino No Site Locations Pain Management and Medication Current Pain Management: Electronic Signature(s) Signed: 09/11/2021 5:22:20 PM By: Levan Hurst RN, BSN Entered By: Levan Hurst on 09/11/2021 11:08:26 -------------------------------------------------------------------------------- Patient/Caregiver Education Details Patient Name: Date of Service: [name] Brunner 6/7/2023andnbsp10:30 [place] Record Number: [id number] Patient Account Number: [id number] Date of Birth/Gender: Treating RN: 1951-03-03 (71 y.o. [name] Primary Care Physician: [name],  Mikeal Hawthorne Other Clinician: Referring Physician: Treating Physician/Extender: Nance Pew in Treatment: 140 Education Assessment Education Provided To: Patient Education Topics Provided Pressure: Methods: Explain/Verbal Responses: Reinforcements needed, State content  correctly Wound/Skin Impairment: Methods: Explain/Verbal Responses: Reinforcements needed, State content correctly Electronic Signature(s) Signed: 09/11/2021 5:06:14 PM By: Baruch Gouty RN, BSN Entered By: Baruch Gouty on 09/11/2021 12:00:42 -------------------------------------------------------------------------------- Wound Assessment Details Patient Name: Date of Service: [name] Brunner. 09/11/2021 10:30 A M Medical Record Number: [id number] Patient Account Number: [id number] Date of Birth/Sex: Treating RN: Sep 09, 1950 (71 y.o. Benjamine Sprague, Briant Cedar Primary Care Nance Mccombs: [name] Other Clinician: Referring Markisha Meding: Treating Roxsana Riding/Extender: Burgess Amor Weeks in Treatment: 140 Wound Status Wound Number: 12 Primary Pressure Ulcer Etiology: Wound Location: Left Ischial Tuberosity Wound Open Wounding Event: Pressure Injury Status: Date Acquired: 09/06/2018 Comorbid Anemia, Hypertension, Type II Diabetes, Osteoarthritis, Weeks Of Treatment: 140 History: Dementia, Paraplegia, Received Radiation Clustered Wound: No Photos Wound Measurements Length: (cm) 5.5 Width: (cm) 8 Depth: (cm) 1.8 Area: (cm) 34.558 Volume: (cm) 62.204 % Reduction in Area: -14543.2% % Reduction in Volume: -29241.5% Epithelialization: Small (1-33%) Tunneling: No Undermining: Yes Starting Position (o'clock): 4 Ending Position (o'clock): 6 Maximum Distance: (cm) 2.2 Wound Description Classification: Category/Stage IV Wound Margin: Well defined, not attached Exudate Amount: Large Exudate Type: Serosanguineous Exudate Color: red, brown Foul Odor After Cleansing: No Slough/Fibrino Yes Wound Bed Granulation Amount: Small (1-33%) Exposed Structure Granulation Quality: Red, Pink Fascia Exposed: No Necrotic Amount: Large (67-100%) Fat Layer (Subcutaneous Tissue) Exposed: Yes Necrotic Quality: Eschar, Adherent Slough Tendon Exposed: No Muscle Exposed: No Joint Exposed:  No Bone Exposed: No Treatment Notes Wound #12 (Ischial Tuberosity) Wound Laterality: Left Cleanser Soap and Water Discharge Instruction: May shower and wash wound with dial antibacterial soap and water prior to dressing change. Wound Cleanser Discharge Instruction: Cleanse the wound with wound cleanser prior to applying a clean dressing using gauze sponges, not tissue or cotton balls. Peri-Wound Care Zinc Oxide Ointment 30g tube Discharge Instruction: Apply Zinc Oxide to periwound with each dressing change Skin Prep Discharge Instruction: Use skin prep as directed Topical meropimen powder Discharge Instruction: to wound bed Primary Dressing KerraCel Ag Gelling Fiber Dressing, 4x5 in (silver alginate) Discharge Instruction: Apply silver alginate to wound bed as instructed Secondary Dressing Woven Gauze Sponge, Non-Sterile 4x4 in Discharge Instruction: Apply over primary dressing as directed. Zetuvit Plus Silicone Border Dressing 5x5 (in/in) Discharge Instruction: Apply silicone border over primary dressing as directed. Secured With Compression Wrap Compression Stockings Environmental education officer) Signed: 09/11/2021 5:22:20 PM By: Levan Hurst RN, BSN Entered By: Levan Hurst on 09/11/2021 11:25:50 -------------------------------------------------------------------------------- Wound Assessment Details Patient Name: Date of Service: [name] Brunner. 09/11/2021 10:30 A M Medical Record Number: [id number] Patient Account Number: [id number] Date of Birth/Sex: Treating RN: 10-Mar-1951 (71 y.o. Benjamine Sprague, Briant Cedar Primary Care Lithzy Bernard: [name] Other Clinician: Referring Tashai Catino: Treating Sora Vrooman/Extender: Burgess Amor Weeks in Treatment: 140 Wound Status Wound Number: 15 Primary Pressure Ulcer Etiology: Wound Location: Right Ischium Wound Open Wounding Event: Pressure Injury Status: Date Acquired: 07/21/2021 Comorbid Anemia, Hypertension, Type II  Diabetes, Osteoarthritis, Weeks Of Treatment: 6 History: Dementia, Paraplegia, Received Radiation Clustered Wound: No Photos Wound Measurements Length: (cm) 3 Width: (cm) 2.5 Depth: (cm) 1.1 Area: (cm) 5.89 Volume: (cm) 6.48 % Reduction in Area: 64.5% % Reduction in Volume: -290.1% Epithelialization: Small (1-33%) Tunneling: No Undermining: No Wound Description Classification: Unstageable/Unclassified Wound Margin: Flat and Intact Exudate Amount: Medium Exudate Type: Serosanguineous Exudate Color: red, brown  Foul Odor After Cleansing: No Slough/Fibrino Yes Wound Bed Granulation Amount: None Present (0%) Exposed Structure Necrotic Amount: Large (67-100%) Fascia Exposed: No Necrotic Quality: Eschar, Adherent Slough Fat Layer (Subcutaneous Tissue) Exposed: Yes Tendon Exposed: No Muscle Exposed: No Joint Exposed: No Bone Exposed: No Treatment Notes Wound #15 (Ischium) Wound Laterality: Right Cleanser Soap and Water Discharge Instruction: May shower and wash wound with dial antibacterial soap and water prior to dressing change. Wound Cleanser Discharge Instruction: Cleanse the wound with wound cleanser prior to applying a clean dressing using gauze sponges, not tissue or cotton balls. Peri-Wound Care Zinc Oxide Ointment 30g tube Discharge Instruction: Apply Zinc Oxide to periwound with each dressing change Skin Prep Discharge Instruction: Use skin prep as directed Topical meropimen powder Discharge Instruction: to wound bed Primary Dressing KerraCel Ag Gelling Fiber Dressing, 4x5 in (silver alginate) Discharge Instruction: Apply silver alginate to wound bed as instructed Secondary Dressing Woven Gauze Sponge, Non-Sterile 4x4 in Discharge Instruction: Apply over primary dressing as directed. Zetuvit Plus Silicone Border Dressing 5x5 (in/in) Discharge Instruction: Apply silicone border or ABD padover primary dressing as directed. Secured With Compression  Wrap Compression Stockings Environmental education officer) Signed: 09/11/2021 5:22:20 PM By: Levan Hurst RN, BSN Entered By: Levan Hurst on 09/11/2021 11:26:35 -------------------------------------------------------------------------------- Vitals Details Patient Name: Date of Service: [name], [name] Breach. 09/11/2021 10:30 A M Medical Record Number: [id number] Patient Account Number: [id number] Date of Birth/Sex: Treating RN: [birth year] (71 y.o. [name] Primary Care Kalib Bhagat: [name] Other Clinician: Referring Alroy Portela: Treating Kasen Adduci/Extender: Burgess Amor Weeks in Treatment: 140 Vital Signs Time Taken: 11:07 Temperature (F): 97.8 Height (in): 63 Pulse (bpm): 78 Weight (lbs): 185 Respiratory Rate (breaths/min): 18 Body Mass Index (BMI): 32.8 Blood Pressure (mmHg): 118/70 Reference Range: 80 - 120 mg / dl Electronic Signature(s) Signed: 09/11/2021 5:22:20 PM By: Levan Hurst RN, BSN Entered By: Levan Hurst on 09/11/2021 11:08:19

## 2021-10-09 ENCOUNTER — Encounter (HOSPITAL_BASED_OUTPATIENT_CLINIC_OR_DEPARTMENT_OTHER): Payer: Medicare Other | Attending: General Surgery | Admitting: General Surgery

## 2021-10-09 DIAGNOSIS — L89313 Pressure ulcer of right buttock, stage 3: Secondary | ICD-10-CM | POA: Insufficient documentation

## 2021-10-09 DIAGNOSIS — L89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, stage 4: Secondary | ICD-10-CM | POA: Insufficient documentation

## 2021-10-09 DIAGNOSIS — E11622 Type 2 diabetes mellitus with other skin ulcer: Secondary | ICD-10-CM | POA: Insufficient documentation

## 2021-10-09 NOTE — Progress Notes (Signed)
JAYA, LAPKA ([id number]) Visit Report for 10/09/2021 Arrival Information Details Patient Name: Date of Service: [name], [name] 10/09/2021 10:30 A M Medical Record Number: [id number] Patient Account Number: [id number] Date of Birth/Sex: Treating RN: [birth year] (71 y.o. [name] Primary Care Gilma Bessette: [name] Other Clinician: Referring Scherry Laverne: Treating Navya Timmons/Extender: Nance Pew in Treatment: 62 Visit Information History Since Last Visit Added or deleted any medications: No Patient Arrived: Wheel Chair Any new allergies or adverse reactions: No Arrival Time: 10:39 Had a fall or experienced change in No Accompanied By: spouse activities of daily living that may affect Transfer Assistance: Manual risk of falls: Patient Identification Verified: Yes Signs or symptoms of abuse/neglect since last visito No Secondary Verification Process Completed: Yes Hospitalized since last visit: No Patient Requires Transmission-Based Precautions: No Implantable device outside of the clinic excluding No Patient Has Alerts: No cellular tissue based products placed in the center since last visit: Has Dressing in Place as Prescribed: Yes Pain Present Now: Yes Electronic Signature(s) Signed: 10/09/2021 5:32:47 PM By: Baruch Gouty RN, BSN Entered By: Baruch Gouty on 10/09/2021 10:40:34 -------------------------------------------------------------------------------- Encounter Discharge Information Details Patient Name: Date of Service: [name] Brunner. 10/09/2021 10:30 A M Medical Record Number: [id number] Patient Account Number: [id number] Date of Birth/Sex: Treating RN: June 07, 1950 (71 y.o. [name] Primary Care Smt. Loder: [name] Other Clinician: Referring Henok Heacock: Treating Othel Dicostanzo/Extender: Nance Pew in Treatment: 144 Encounter Discharge Information Items Post Procedure Vitals Discharge Condition:  Stable Temperature (F): 97.6 Ambulatory Status: Wheelchair Pulse (bpm): 64 Discharge Destination: Home Respiratory Rate (breaths/min): 18 Transportation: Private Auto Blood Pressure (mmHg): 150/76 Accompanied By: spouse Schedule Follow-up Appointment: Yes Clinical Summary of Care: Patient Declined Electronic Signature(s) Signed: 10/09/2021 5:32:47 PM By: Baruch Gouty RN, BSN Entered By: Baruch Gouty on 10/09/2021 13:27:10 -------------------------------------------------------------------------------- Multi Wound Chart Details Patient Name: Date of Service: [name] Pulse M. 10/09/2021 10:30 A M Medical Record Number: [id number] Patient Account Number: [id number] Date of Birth/Sex: Treating RN: Aug 13, 1950 (71 y.o. [name], Linda Primary Care Kelven Flater: [name] Other Clinician: Referring Jennilyn Esteve: Treating Tyera Hansley/Extender: Nance Pew in Treatment: 144 Vital Signs Height(in): 73 Pulse(bpm): 17 Weight(lbs): 185 Blood Pressure(mmHg): 150/76 Body Mass Index(BMI): 32.8 Temperature(F): 97.6 Respiratory Rate(breaths/min): 18 Photos: [N/A:N/A] Left Ischial Tuberosity Right Ischium N/A Wound Location: Pressure Injury Pressure Injury N/A Wounding Event: Pressure Ulcer Pressure Ulcer N/A Primary Etiology: Anemia, Hypertension, Type II Anemia, Hypertension, Type II N/A Comorbid History: Diabetes, Osteoarthritis, Dementia, Diabetes, Osteoarthritis, Dementia, Paraplegia, Received Radiation Paraplegia, Received Radiation 09/06/2018 07/21/2021 N/A Date Acquired: 144 10 N/A Weeks of Treatment: Open Open N/A Wound Status: No No N/A Wound Recurrence: 2.5x8.8x1.4 2.5x2.1x0.7 N/A Measurements L x W x D (cm) 17.279 4.123 N/A A (cm) : rea 24.19 2.886 N/A Volume (cm) : -7221.60% 75.20% N/A % Reduction in A rea: -11310.40% -73.80% N/A % Reduction in Volume: 4 Starting Position 1 (o'clock): 6 Ending Position 1 (o'clock): 2 Maximum Distance  1 (cm): Yes No N/A Undermining: Category/Stage IV Category/Stage III N/A Classification: Large Medium N/A Exudate A mount: Serosanguineous Serosanguineous N/A Exudate Type: red, brown red, brown N/A Exudate Color: Well defined, not attached Flat and Intact N/A Wound Margin: Small (1-33%) Small (1-33%) N/A Granulation A mount: Red, Pink Red N/A Granulation Quality: Large (67-100%) Large (67-100%) N/A Necrotic A mount: Eschar, Adherent Slough Eschar, Adherent Slough N/A Necrotic Tissue: Fat Layer (Subcutaneous Tissue): Yes Fat Layer (Subcutaneous Tissue): Yes N/A Exposed Structures: Fascia: No Fascia: No  Tendon: No Tendon: No Muscle: No Muscle: No Joint: No Joint: No Bone: No Bone: No None None N/A Epithelialization: Debridement - Excisional Debridement - Excisional N/A Debridement: Pre-procedure Verification/Time Out 11:00 11:00 N/A Taken: Lidocaine 5% topical ointment Lidocaine 5% topical ointment N/A Pain Control: Necrotic/Eschar, Fat, Subcutaneous, Necrotic/Eschar, Fat, Subcutaneous, N/A Tissue Debrided: [employer] Skin/Subcutaneous Tissue Skin/Subcutaneous Tissue N/A Level: 22 5.25 N/A Debridement A (sq cm): rea Blade, Curette, Forceps, Scissors Curette, Forceps, Scissors N/A Instrument: Minimum Minimum N/A Bleeding: Pressure Pressure N/A Hemostasis Achieved: Insensate Insensate N/A Procedural Pain: Insensate Insensate N/A Post Procedural Pain: Procedure was tolerated well Procedure was tolerated well N/A Debridement Treatment Response: 2.5x8.8x1.4 2.5x2.1x0.7 N/A Post Debridement Measurements L x W x D (cm) 24.19 2.886 N/A Post Debridement Volume: (cm) Category/Stage IV Category/Stage III N/A Post Debridement Stage: Debridement Debridement N/A Procedures Performed: Treatment Notes Electronic Signature(s) Signed: 10/09/2021 11:37:51 AM By: Fredirick Maudlin MD FACS Signed: 10/09/2021 5:32:47 PM By: Baruch Gouty RN, BSN Entered By: Fredirick Maudlin on 10/09/2021 11:37:51 -------------------------------------------------------------------------------- Multi-Disciplinary Care Plan Details Patient Name: Date of Service: [name], [name] Breach. 10/09/2021 10:30 A M Medical Record Number: [id number] Patient Account Number: [id number] Date of Birth/Sex: Treating RN: 28-Mar-1951 (71 y.o. [name] Primary Care Daishawn Lauf: [name] Other Clinician: Referring Britley Gashi: Treating Foster Sonnier/Extender: Nance Pew in Treatment: [place] reviewed with physician Active Inactive Pressure Nursing Diagnoses: Knowledge deficit related to management of pressures ulcers Potential for impaired tissue integrity related to pressure, friction, moisture, and shear Goals: Patient/caregiver will verbalize understanding of pressure ulcer management Date Initiated: 05/22/2021 Target Resolution Date: 11/07/2021 Goal Status: Active Interventions: Assess: immobility, friction, shearing, incontinence upon admission and as needed Assess offloading mechanisms upon admission and as needed Notes: Wound/Skin Impairment Nursing Diagnoses: Knowledge deficit related to ulceration/compromised skin integrity Goals: Patient/caregiver will verbalize understanding of skin care regimen Date Initiated: 12/30/2018 Target Resolution Date: 11/07/2021 Goal Status: Active Interventions: Assess patient/caregiver ability to obtain necessary supplies Assess patient/caregiver ability to perform ulcer/skin care regimen upon admission and as needed Assess ulceration(s) every visit Provide education on ulcer and skin care Treatment Activities: Skin care regimen initiated : 12/30/2018 Topical wound management initiated : 12/30/2018 Notes: 01/08/21: Wound care regimen continues Electronic Signature(s) Signed: 10/09/2021 5:32:47 PM By: Baruch Gouty RN, BSN Entered By: Baruch Gouty on 10/09/2021  11:20:01 -------------------------------------------------------------------------------- Pain Assessment Details Patient Name: Date of Service: [name] Brunner. 10/09/2021 10:30 A M Medical Record Number: [id number] Patient Account Number: [id number] Date of Birth/Sex: Treating RN: 1951/01/02 (71 y.o. [name] Primary Care Tamella Tuccillo: [name] Other Clinician: Referring Webb Weed: Treating Marcial Pless/Extender: Nance Pew in Treatment: 144 Active Problems Location of Pain Severity and Description of Pain Patient Has Paino Yes Site Locations Pain Location: Generalized Pain With Dressing Change: No Duration of the Pain. Constant / Intermittento Intermittent Rate the pain. Current Pain Level: 0 Worst Pain Level: 4 Least Pain Level: 0 Character of Pain Describe the Pain: Aching, Tender Pain Management and Medication Current Pain Management: Other: reposition [name] the Current Pain Management Adequate: Adequate How does your wound impact your activities of daily livingo Sleep: No Bathing: No Appetite: No Relationship With Others: No Bladder Continence: No Emotions: No Bowel Continence: No Work: No Toileting: No Drive: No Dressing: No Hobbies: No Notes reports left hip tenderness Electronic Signature(s) Signed: 10/09/2021 5:32:47 PM By: Baruch Gouty RN, BSN Entered By: Baruch Gouty on 10/09/2021 10:46:15 -------------------------------------------------------------------------------- Wound Assessment Details Patient Name: Date of Service: [name], [name] M. 10/09/2021 10:30 A M Medical Record Number: [id number] Patient Account Number: [id number] Date of Birth/Sex: Treating RN: 11/09/1950 (71 y.o. [name], [name] Primary Care Taetum Flewellen: [name] Other Clinician: Referring Leslieann Whisman: Treating Nilaya Bouie/Extender: Burgess Amor Weeks in Treatment: 144 Wound Status Wound Number: 12 Primary Pressure Ulcer Etiology: Wound  Location: Left Ischial Tuberosity Wound Open Wounding Event: Pressure Injury Status: Date Acquired: 09/06/2018 Comorbid Anemia, Hypertension, Type II Diabetes, Osteoarthritis, Weeks Of Treatment: 144 History: Dementia, Paraplegia, Received Radiation Clustered Wound: No Photos Wound Measurements Length: (cm) 2.5 Width: (cm) 8.8 Depth: (cm) 1.4 Area: (cm) 17.279 Volume: (cm) 24.19 % Reduction in Area: -7221.6% % Reduction in Volume: -11310.4% Epithelialization: None Tunneling: No Undermining: Yes Starting Position (o'clock): 4 Ending Position (o'clock): 6 Maximum Distance: (cm) 2 Wound Description Classification: Category/Stage IV Wound Margin: Well defined, not attached Exudate Amount: Large Exudate Type: Serosanguineous Exudate Color: red, brown Foul Odor After Cleansing: No Slough/Fibrino Yes Wound Bed Granulation Amount: Small (1-33%) Exposed Structure Granulation Quality: Red, Pink Fascia Exposed: No Necrotic Amount: Large (67-100%) Fat Layer (Subcutaneous Tissue) Exposed: Yes Necrotic Quality: Eschar, Adherent Slough Tendon Exposed: No Muscle Exposed: No Joint Exposed: No Bone Exposed: No Treatment Notes Wound #12 (Ischial Tuberosity) Wound Laterality: Left Cleanser Wound Cleanser Discharge Instruction: Cleanse the wound with wound cleanser prior to applying a clean dressing using gauze sponges, not tissue or cotton balls. Soap and Water Discharge Instruction: May shower and wash wound with dial antibacterial soap and water prior to dressing change. Wound Cleanser Discharge Instruction: Cleanse the wound with wound cleanser prior to applying a clean dressing using gauze sponges, not tissue or cotton balls. Peri-Wound Care Zinc Oxide Ointment 30g tube Discharge Instruction: Apply Zinc Oxide to periwound with each dressing change Skin Prep Discharge Instruction: Use skin prep as directed Topical meropenem powder Discharge Instruction: to wound bed Primary  Dressing KerraCel Ag Gelling Fiber Dressing, 4x5 in (silver alginate) Discharge Instruction: Apply silver alginate to wound bed as instructed Secondary Dressing Woven Gauze Sponge, Non-Sterile 4x4 in Discharge Instruction: Apply over primary dressing as directed. Zetuvit Plus Silicone Border Dressing 5x5 (in/in) Discharge Instruction: Apply silicone border over primary dressing as directed. Zetuvit Plus Silicone Border Dressing 7x7(in/in) Discharge Instruction: Apply silicone border over primary dressing as directed. Secured With Compression Wrap Compression Stockings Environmental education officer) Signed: 10/09/2021 5:32:47 PM By: Baruch Gouty RN, BSN Entered By: Baruch Gouty on 10/09/2021 10:57:22 -------------------------------------------------------------------------------- Wound Assessment Details Patient Name: Date of Service: [name] Brunner. 10/09/2021 10:30 A M Medical Record Number: [id number] Patient Account Number: [id number] Date of Birth/Sex: Treating RN: May 22, 1950 (71 y.o. [name], [name] Primary Care Jahari Wiginton: [name] Other Clinician: Referring Keylin Podolsky: Treating Chardae Mulkern/Extender: Burgess Amor Weeks in Treatment: 144 Wound Status Wound Number: 15 Primary Pressure Ulcer Etiology: Wound Location: Right Ischium Wound Open Wounding Event: Pressure Injury Status: Date Acquired: 07/21/2021 Comorbid Anemia, Hypertension, Type II Diabetes, Osteoarthritis, Weeks Of Treatment: 10 History: Dementia, Paraplegia, Received Radiation Clustered Wound: No Photos Wound Measurements Length: (cm) 2.5 Width: (cm) 2.1 Depth: (cm) 0.7 Area: (cm) 4.123 Volume: (cm) 2.886 % Reduction in Area: 75.2% % Reduction in Volume: -73.8% Epithelialization: None Tunneling: No Undermining: No Wound Description Classification: Category/Stage III Wound Margin: Flat and Intact Exudate Amount: Medium Exudate Type: Serosanguineous Exudate Color: red,  brown Foul Odor After Cleansing: No Slough/Fibrino Yes Wound Bed Granulation Amount: Small (1-33%) Exposed Structure Granulation Quality: Red Fascia Exposed: No Necrotic Amount: Large (67-100%) Fat Layer (Subcutaneous Tissue) Exposed: Yes Necrotic  Quality: Eschar, Adherent Slough Tendon Exposed: No Muscle Exposed: No Joint Exposed: No Bone Exposed: No Treatment Notes Wound #15 (Ischium) Wound Laterality: Right Cleanser Soap and Water Discharge Instruction: May shower and wash wound with dial antibacterial soap and water prior to dressing change. Wound Cleanser Discharge Instruction: Cleanse the wound with wound cleanser prior to applying a clean dressing using gauze sponges, not tissue or cotton balls. Peri-Wound Care Zinc Oxide Ointment 30g tube Discharge Instruction: Apply Zinc Oxide to periwound with each dressing change Skin Prep Discharge Instruction: Use skin prep as directed Topical meropenem powder Discharge Instruction: to wound bed Primary Dressing KerraCel Ag Gelling Fiber Dressing, 4x5 in (silver alginate) Discharge Instruction: Apply silver alginate to wound bed as instructed Secondary Dressing Woven Gauze Sponge, Non-Sterile 4x4 in Discharge Instruction: Apply over primary dressing as directed. Zetuvit Plus Silicone Border Dressing 5x5 (in/in) Discharge Instruction: Apply silicone border or ABD padover primary dressing as directed. Secured With Compression Wrap Compression Stockings Environmental education officer) Signed: 10/09/2021 5:32:47 PM By: Baruch Gouty RN, BSN Entered By: Baruch Gouty on 10/09/2021 10:58:25 -------------------------------------------------------------------------------- Vitals Details Patient Name: Date of Service: [name] Brunner. 10/09/2021 10:30 A M Medical Record Number: [id number] Patient Account Number: [id number] Date of Birth/Sex: Treating RN: [birth year] (71 y.o. [name] Primary Care Treyshawn Muldrew: [name] Other Clinician: Referring Arrow Tomko: Treating Tenasia Aull/Extender: Nance Pew in Treatment: 144 Vital Signs Time Taken: 10:40 Temperature (F): 97.6 Height (in): 63 Pulse (bpm): 94 Weight (lbs): 185 Respiratory Rate (breaths/min): 18 Body Mass Index (BMI): 32.8 Blood Pressure (mmHg): 150/76 Reference Range: 80 - 120 mg / dl Electronic Signature(s) Signed: 10/09/2021 5:32:47 PM By: Baruch Gouty RN, BSN Entered By: Baruch Gouty on 10/09/2021 10:42:33

## 2021-10-09 NOTE — Progress Notes (Signed)
[name] ([id number]) Visit Report for 10/09/2021 Chief Complaint Document Details Patient Name: Date of Service: [name], [name] 10/09/2021 10:30 A M Medical Record Number: [id number] Patient Account Number: [id number] Date of Birth/Sex: Treating RN: 1951/01/27 (71 y.o. [name] Primary Care Provider: Sandi [name] Other Clinician: Referring Provider: Treating Provider/Extender: [name] in Treatment: 144 Information Obtained from: Patient Chief Complaint She is here for follow up evaluation of left ischial pressure ulcer 12/30/2018; patient comes back for review of wounds in the same general area over the previously healed left ischial pressure ulcer Electronic Signature(s) Signed: 10/09/2021 11:37:59 AM By: [name] Maudlin MD FACS Entered By: [name] on 10/09/2021 11:37:59 -------------------------------------------------------------------------------- Debridement Details Patient Name: Date of Service: [name]. 10/09/2021 10:30 A M Medical Record Number: [id number] Patient Account Number: [id number] Date of Birth/Sex: Treating RN: [birth year] (71 y.o. [name] Primary Care Provider: Sandi [name] Other Clinician: Referring Provider: Treating Provider/Extender: [name] in Treatment: 144 Debridement Performed for Assessment: Wound #15 Right Ischium Performed By: Physician [name] Maudlin, MD Debridement Type: Debridement Level of Consciousness (Pre-procedure): Awake and Alert Pre-procedure Verification/Time Out Yes - 11:00 Taken: Start Time: 11:03 Pain Control: Lidocaine 5% topical ointment T Area Debrided (L x W): otal 2.5 (cm) x 2.1 (cm) = 5.25 (cm) Tissue and other material debrided: Viable, Non-Viable, Eschar, Fat, Slough, Subcutaneous, Slough Level: Skin/Subcutaneous Tissue Debridement Description: Excisional Instrument: Curette, Forceps, Scissors Bleeding: Minimum Hemostasis  Achieved: Pressure Procedural Pain: Insensate Post Procedural Pain: Insensate Response to Treatment: Procedure was tolerated well Level of Consciousness (Post- Awake and Alert procedure): Post Debridement Measurements of Total Wound Length: (cm) 2.5 Stage: Category/Stage III Width: (cm) 2.1 Depth: (cm) 0.7 Volume: (cm) 2.886 Character of Wound/Ulcer Post Debridement: Requires Further Debridement Post Procedure Diagnosis Same as Pre-procedure Electronic Signature(s) Signed: 10/09/2021 12:22:20 PM By: [name] Maudlin MD FACS Signed: 10/09/2021 5:32:47 PM By: [name] Gouty RN, BSN Entered By: [name] on 10/09/2021 11:21:48 -------------------------------------------------------------------------------- Debridement Details Patient Name: Date of Service: [name]. 10/09/2021 10:30 A M Medical Record Number: [id number] Patient Account Number: [id number] Date of Birth/Sex: Treating RN: May 12, 1950 (71 y.o. [name] Primary Care Provider: Sandi [name] Other Clinician: Referring Provider: Treating Provider/Extender: [name] in Treatment: 144 Debridement Performed for Assessment: Wound #12 Left Ischial Tuberosity Performed By: Physician [name] Maudlin, MD Debridement Type: Debridement Level of Consciousness (Pre-procedure): Awake and Alert Pre-procedure Verification/Time Out Yes - 11:00 Taken: Start Time: 11:03 Pain Control: Lidocaine 5% topical ointment T Area Debrided (L x W): otal 2.5 (cm) x 8.8 (cm) = 22 (cm) Tissue and other material debrided: Viable, Non-Viable, Eschar, Fat, Slough, Subcutaneous, Slough Level: Skin/Subcutaneous Tissue Debridement Description: Excisional Instrument: Blade, Curette, Forceps, Scissors Bleeding: Minimum Hemostasis Achieved: Pressure Procedural Pain: Insensate Post Procedural Pain: Insensate Response to Treatment: Procedure was tolerated well Level of Consciousness (Post- Awake and  Alert procedure): Post Debridement Measurements of Total Wound Length: (cm) 2.5 Stage: Category/Stage IV Width: (cm) 8.8 Depth: (cm) 1.4 Volume: (cm) 24.19 Character of Wound/Ulcer Post Debridement: Requires Further Debridement Post Procedure Diagnosis Same as Pre-procedure Electronic Signature(s) Signed: 10/09/2021 12:22:20 PM By: [name] Maudlin MD FACS Signed: 10/09/2021 5:32:47 PM By: [name] Gouty RN, BSN Entered By: [name] on 10/09/2021 11:22:46 -------------------------------------------------------------------------------- HPI Details Patient Name: Date of Service: [name]. 10/09/2021 10:30 A M Medical Record Number: [id number] Patient Account Number: [id number] Date of Birth/Sex: Treating RN: 11-25-1950 (71 y.o.  [name] Primary Care Provider: Sandi [name] Other Clinician: Referring Provider: Treating Provider/Extender: [name] in Treatment: 144 History of Present Illness Location: open ulceration of the left gluteal area, left heel and right ankle for about 5 months. Quality: Patient reports No Pain. Severity: Patient states wound(s) are getting worse. Duration: Patient has had the wound for > 5 months prior to seeking treatment at the wound center Context: The wound occurred when the patient has been paraplegic for about 3 years. Modifying Factors: Wound improving due to current treatment. ssociated Signs and Symptoms: Patient reports having foul odor. A HPI Description: this 71 year old patient who is known to have hypertension, hypothyroidism, breast cancer, chronic pain syndrome, paraplegia was noted to have a left gluteal decubitus ulcer and was brought into the hospital. During the course of her hospitalization she was debrided in the operating room by ankle wound. Bone cultures were taken at that time but were negative but clinically she was treated for osteomyelitis because of the probing down to bone and  open exposed bone. Home health has been giving her antibioticss which include vancomycin and Zosyn. The patient was a smoker until about 3 weeks ago and used to smoke about 10 cigarettes a day for a long while. 12/13/2014 - details of her operative note from 11/03/2014 were reviewed -- PROCEDURE: 1. Excisional debridement skin, subcutaneous, muscle left ischium 35 cm2 2. Excisional debridement skin, subcutaneous tissue left heel 27 cm2 3. Excisional debridement right ankle skin, subcutaneous, bone 30 cm2 01/24/2015 -- she has some issues with her wheelchair cushion but other than that is doing very well and has received Podus boots for her feet. 02/14/2015 -- she was using her old offloading boots and this seemed to have caused her a new pressure ulcer on the left posterior heel near the superior part just below the Achilles tendon. 03/07/2015 -- she has a new ulceration just to the left of the midline on her sacral region more on the left buttock and this has been there for Dr. Leland Johns and had all the wounds sharply debrided. The debridement was done for the left ischial wound, the left heel wound and the right about a week. 08/22/2015 -- was recently admitted to hospital between May 5 and 08/13/2015, with sepsis and leukocytosis due to a UTI. she was treated for a sepsis complicating Escherichia coli UTI and kidney stones. She also had metabolic and careful up at the secondary to pyelonephritis. He received broad-spectrum antibiotics initially and then received Macrobid as per urology. She was sent home on nitrofurantoin. during her admission she had a CT scan which showed exposed left ischial tuberosity without evidence of osteolysis. 09/12/2015-- the patient is having some issues with her air mattress and would like to get a opinion from medical modalities. 10/10/2015 -- the issue with her air mattress has not yet been sorted out and the new problem seems to be a lot of odor from the wound  VAC. 11/27/2015 -- the patient was admitted to the hospital between July 23 and 10/31/2015. Her problems were sepsis, osteomyelitis of the pelvic bone and acute pyelonephritis. CT of the abdomen and pelvis was consistent with a left-sided pyelonephritis with hydronephrosis and also just showed new sclerosis of the posterior portion of the left anterior pubic ramus suggestive of periosteal reaction consistent with osteomyelitis. She was treated for the osteomyelitis with infectious disease consult recommending 6 weeks of IV antibiotics including vancomycin and Rocephin and the antibiotics were to go on  until 12/10/2015. He was seen by Dr. Iran Planas plastic surgery and Dr. Linus Salmons of infectious disease. She had a suprapubic catheter placed during the admission. CT scan done on 10/28/2015 showed specifically -- New sclerosis of the posterior portion of the left inferior pubic ramus with aggressive periosteal reaction, consistent with osteomyelitis, with adjacent soft tissue gas compatible with previously described decubitus ulcer. 12/12/2015 -- she was recently seen by Dr. Linus Salmons, who noted good improvement and CRP and ESR compared to before and he has stopped her antibiotic as per plans to finish on September 4. The patient was encouraged to continue with wound care and consider hyperbaric oxygen therapy. Today she tells me that she has consented to undergo hyperbaric oxygen therapy and we can start the paperwork. 01/02/2016 -- her PCP had gained about 3 years but she still persists in having problems during hyperbaric oxygen therapy with some discomfort in the ears. 01/09/16; pressure area with underlying osteomyelitis in the left buttock. Wound bed itself has some slight amount of grayish surface slough however I do not think any debridement was necessary. There is no exposed bone soft tissue appears stable. She is using a wound VAC 01/16/16; back for weekly wound review in conjunction with HBO. She has a  deep wound over the left initial tuberosity previously treated with 6 weeks of IV antibiotics for osteomyelitis. Wound bed looks reasonably healthy although the base of this is still precariously close to bone. She has been using a wound VAC. 01/23/2016 -- she has completed her course of antibiotics and this week the only new thing is her right great toe nail was avulsed and she has got an open wound over the nailbed. 01/31/16 she has completed her course of antibiotics. Her right great toenail avulsed last week and she's been using silver alginate for this as well. Still using a wound VAC to the substantial stage IV wound over the left ischial tuberosity 03/05/2016 -- the patient has had a opinion from the plastic surgery group at [facility] and details of this are not available yet but the patient's verbal report has been heard by me. Did not sound like there was any optimistic discussion regarding reconstruction and the net result would be to continue with the wound VAC application. I will await the official reports. Addendum: -- she was seen at [place] surgery service by Dr. Tressa Busman. After a thorough review and from what I understand spending 45 minutes with the patient his assessment has been noted by me in detail and the management options were: 1. Continued pressure offloading and wound care versus operative procedures including wound excision 2. Soft tissue and bone sampling 3. If the wound gets larger wound closure would be done using a variety of plastic surgical techniques including but not limited to skin substitute, possible skin graft, local versus regional flaps, negative pressure dressing application. 4. He discussed with her details of flap surgery and the risks associated 5. He made a comment that since the patient was operated on by Dr. Leland Johns of [facility] plastic surgery unit in [place] the patient may continue to follow-up there  for further evaluation for surgical flap closure in the future. 03/19/2016 -- the patient continues to be rather depressed and frustrated with her lack of rapid progress in healing this wound especially because she thought after hyperbaric oxygen therapy the wound would heal extremely fast. She now understands that was not the implied benefit on wound care which was the  recommendation for hyperbaric oxygen therapy. I have had a lengthy discussion with the patient and her husband regarding her options: 1. Continue with collagen and wound VAC for the primary dressing and offloading and all supportive care. 2. See Dr. Iran Planas for possible placement of Acell or Integra in the OR. 3. get a second opinion from a wound care center and surrounding regions/counties 05/07/2016 -- Note from Dr. Celedonio Miyamoto, who noted that the patient has declined flap surgery. She has discussed application of A cell, and try a few applications to see how the wound progresses. She is also recommended that we could apply products here in the wound center, like Oasis. during her preop workup it was found that her hemoglobin A1c was 11% and she has now been diagnosed as having diabetes mellitus and has been put on appropriate treatment by her PCP 05/28/2016 -- tells me her blood sugars have been doing well and she has an appointment to see her PCP in the next couple of weeks to check her hemoglobin A1c. Other than that she continues to do well. 06/25/2016 -- have not seen her back for the last month but she says her health has been about the same and she has an appointment to check the A1c next week 09/10/16 ---- was seen by Dr. Celedonio Miyamoto -- who applied Acell and saw her back in follow-up. She has recommended silver alginate to the wound every other day and cover with foam. If no significant drainage could transition to collagen every other day. She recommended discontinuing wound VAC. There were no plans to repeat  application of Acell. The patient expressed that her husband could do the wound care as going to the Wound Ctr., would cost several $100 for each visit. 10/21/2016 -- her insurance company is getting her new mattress and she is pleased about that. Other than that she has been doing dressings with PolyMem Silver and has been doing very well 02/18/2017 -- she has gone through several changes of her mattress and has not been pleased with any of them. The ventricles are still working on trying to fit her with the appropriate low air-loss mattress. She has a new wound on the gluteal area which is clearly separated from the original wound. 03/25/17-she is here in follow-up evaluation for her left ischialpressure ulcer. She remains unsatisfied with her pressure mattress. She admits to sitting multiple hours a day, in the bed. We have discussed offloading options. The wound does not appear infected. Nutrition does not appear to be a concern. Will follow-up in 4 weeks, if wound continues to be stalled may consider x-ray to evaluate for refractory osteomyelitis. 04/21/17; this is a patient that I don't know all that well. She has a chronic wound which at one point had underlying osteomyelitis in the left ischial tuberosity. This is a stage IV pressure ulcer. Over the last 3 months she has a stage II wound inferiorly to the original wound. The last time she was here her dressing was changed to silver collagen although the patient's husband who changes the dressing said that the collagen stuck to the wound and remove skin from the superficial area therefore he switched back to Lasana 05/13/17; this is a patient we've been following for a left ischial tuberosity wound which was stage IV at one point had underlying osteomyelitis. Over the last several months she's had a stage II wound just inferior and medial to the related to the wound. According to her husband he is  using Endoform layer with collagen although  this is not what I had last time. According to her husband they are using Elgie Congo with collagen although I don't quite know how that started. She was hospitalized from 1/20 through 04/30/16. This was related to a UTI. Her blood cultures were negative, urine culture showed multiple species. She did have a CT scan of the abdomen and pelvis which documented chronic osteomyelitis in the area of the wound inflammatory markers were unremarkable. She has had prior knowledge of osteomyelitis. It looks as though she received IV antibiotics in 2017 and was treated with a course of hyperbaric oxygen. 05/28/17; the wound over the left ischial tuberosity is deeper today and abuts clearly on bone. Nursing intake reported drainage. I therefore culture of the wound. The more superficial area just below this looks about the same. They once again complained that there are mattress cover is not working although apparently [facility] is been noted to see this many times in the report is that the device is functional 06/18/17; the patient had a probing area on the left ischial tuberosity that was draining purulent fluid last time. This also clearly seemed to have open bone. Culture I did showed pansensitive pseudomonas including third generation cephalosporins. I treated this with cefdinir 300 twice a day for 10 days and things seem to have improved. She has a more superficial wound just underneath this area. Amazingly she has a new air mattress through [facility]. I think they gave this to her as a parking give. In any case this now works according to the patient may have something to do with why the areas are looking better. 07/09/17; the patient has a probing area in the left ischial tuberosity that still has some depth. However this is contracted in terms of the wound orifice although the depth is still roughly the same. There is no undermining. She also has the satellite wound which is more  superficial. This appears to have a healthy surface we've been using silver collagen 08/06/17; the patient's wound is over the left ischial tuberosity and a satellite lesion just underneath this. The original wound was actually a deep stage 4 wound. We have made good progress in 2 months and there is no longer exposed bone here. 09/03/17; left ischial tuberosity actually appears to be quite healthy. I think we are making progress. No debridement is required. There is no surrounding erythema 10/01/17 I follow this patient monthly for her left ischial tuberosity wound. There is 2 areas the original area and a satellite area. The satellite area looks a lot better there is no surrounding erythema. Her husband relates that he is having trouble maintaining the dressing. This has to do with the soft tissue around it. He states he puts the collagen in but he cannot make sure that it stays in even with the ABD pads and tape that he is been using 10/29/17; patient arrives with a better looking noon today. Some of the satellite lesions have closed. using Prisma 11/26/17; the patient has a large cone-shaped area with the tip of the Cone deep within her buttock soft tissue. The walls of the Cone are epithelialized however the base is still open. The area at the base of this looks moist we've been using silver collagen. Will change to silver alginate 12/31/2017; the wound appears to have come in fairly nicely. Using silver alginate. There is no surrounding maceration or infection 01/28/18; there is still an open area here over  the left initial tuberosity. Base of this however looks healthy. There is no surrounding infection 02/25/18; the area of its open is over the left ischial tuberosity. The base of this is where the wound is. This is a large inverted cone-shaped area with the wound at the tip. Dimensions of the wound at the tip are improved. There is a area of denuded skin about halfway towards the tip which her  husband thinks may have happened today when he was bathing her. 04/20/17; the area is still open over the left initial tuberosity. 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 This is now completely closed over although it is closed over with a divot and skin over bone at the base of this. Continued aggressive offloading will be necessary. 12/30/2018 READMISSION This is a 71 year old woman with chronic paraplegia. I picked her up for her care from Dr. Con Memos in this clinic after he departed. She had a stage IV pressure wound over the left ischial tuberosity. She was treated twice for her underlying osteomyelitis and this I believe firstly in 2016 and again in 2017. There were some plans at some point for flap closure of this however she was discovered to have uncontrolled diabetes and I do not think this was ever accomplished. She ultimately healed over in this clinic and was discharged in May. She has a large cone-shaped indentation with the tip of this going towards the left ischial tuberosity. It is not an easy area to examine but at that time I thought all of this was epithelialized. Apparently there was a reopening here shortly after she left the clinic last time. She was admitted to hospital at the end of June for Klebsiella bacteremia felt to be secondary to UTI. A CT scan of the pelvis is listed below and there was initially some concern that she had underlying osteomyelitis although I believe she was seen by infectious disease and that was felt to be not the case: I do not see any new cultures or inflammatory markers IMPRESSION: 1. No CT evidence for acute intra-abdominal or pelvic abnormality. Large volume of stool throughout the colon. 2. Enlarged fatty liver with fat sparing near the gallbladder  fossa 3. Cortical scarring right kidney. Bilateral intrarenal stones without hydronephrosis. Thick-walled urinary bladder decompressed by suprapubic catheter 4. Deep left decubitus ulcer with underlying left ischial changes suggesting osteomyelitis. Her husband has been using silver collagen in the wound. She has not been systemically unwell no fever chills eating and drinking well. They rigorously offload this wound only getting up in the wheelchair when she is going to appointments the rest of the time she is in bed. 10/8; wound measures larger and she now has exposed bone. We have been using silver alginate 11/12 still using silver alginate. The patient saw Dr. Megan Salon of infectious disease. She was started on Augmentin 500 mg twice daily. She is due to follow-up with Dr. Megan Salon I believe next week. Lab work Dr. Megan Salon requested showed a sedimentation rate of 28 and CRP of 20 although her CRP 1 year ago was 18.8. Sedimentation rate 1 year ago was 11 basic metabolic panel showed a creatinine of 1.12 12/3; the patient followed up with Dr. Megan Salon yesterday. She is still on Augmentin twice daily. This was directed by Dr. Megan Salon. The  patient's inflammatory markers have improved which is gratifying. Her C-reactive protein was repeated yesterday and follow-up booked with infectious disease in January. In addition I have been getting secure text messages I think from palliative care through [facility] [employer]. I think they were hoping to provide services to the patient in her home. They could not get a hold of the primary physician and so they reached out to me on 2 separate occasions. 12/17; patient last saw Dr. Megan Salon on 12/2. She is finishing up with Augmentin. Her C-reactive protein was 20 on 10/21, 10.1 on 11/19 and 17 on 12/2. The wound itself still has depth and undermining. We are using Santyl with the backing wet-to-dry 04/27/2019. The wound is gradually clearing  up in terms of the surface although it is not filled in that much. Still abuts right against bone 2/4; patient with a deep pressure ulcer over the left ischial tuberosity. I thought she was going to follow-up with infectious disease to follow her inflammatory markers although the patient states that they stated that they did not need to see her unless we felt it was necessary. I will need to check their notes. In any case we ordered moistened silver collagen back with wet-to-dry to fill in the depth of the wound although apparently [facility] sent silver alginate which they have been using since they were here the last time. Is obviously not what we ordered. 2/25. Not much change in this wound it is over the left ischial tuberosity recurrent wound. We have been using silver collagen with backing wet-to-dry. I think the wound is about the same. There is still some tunneling from about 10-12 o'clock over the ischial tuberosity itself 3/11; pressure ulcer over the left ischial tuberosity. Since she was last here the wound VAC was started and apparently going quite well. We are able to get the home health company that accepts [place] [facility] which is in itself sometimes problematic. There is been improvements in the wound the tunneling seems to be better and is contracted nicely 4/8; 1 month follow-up. Since she was last here we have been using silver collagen under a wound VAC. Some minor contraction I think in wound volume. She is cared for diligently by her husband including pressure relief, incontinence management, nutritional support etc. 6/1; this is almost a 67-month follow-up. She is been using silver collagen under wound VAC. Circular area over the left ischial tuberosity. She has been using silver collagen under wound VAC 7/8; 1 month follow-up. Silver collagen under the VAC not really a lot of progress. Tissue at the base of the wound which is right against bone and the tissue next that this does  not look completely viable. She is not currently on any antibiotics, she had underlying osteomyelitis I need to look this over 8/16; we are using silver collagen under wound VAC to the left ischial tuberosity wound. Comes in today with absolutely no change in surface area or depth. There is no exposed bone. I did look over her infectious disease notes as I said I would do last time. She last saw Dr. Megan Salon in December 2020. She completed 6 weeks of Augmentin. This was in response to a bone culture I did showing methicillin susceptible staph aureus and Enterococcus. She was supposed to come back to see Dr. Megan Salon at some point although they say that that appointment was canceled unless I chose to recommend return. I think there was supposed to be follow-up with inflammatory markers  but I cannot see that that was ever done. She has not been on antibiotics since 9/21; monthly follow-up. We received a call from home health nurse last evening to report green drainage coming out of the wound. Lab work I ordered last time showed a white count of 5.2 a sedimentation rate of 45 and a C-reactive protein of 25 however neither one of the 2 values are substantially different from her previous values in October 2020 or December 2020. Both are slightly higher but only marginally. Otherwise no new complaints from the patient or her husband 10/19; 1 month follow-up. PCR culture I did last time showed medium quantities of Pseudomonas lower quantities Klebsiella and Enterococcus faecalis group B strep and Peptostreptococcus. I gave her Augmentin for 2 weeks. I am not really sure of my choice of this I would not cover Pseudomonas. She is still having green drainage. Wound itself looks satisfactory there is not a lot of depth wound bed looks healthy 11/16; patient has completed the antibiotics still using gentamicin and silver alginate on the wound. There is improvement in the surface area 12/21; in general the area on  the buttock looks somewhat better. Surface looks healthy although I do not know that there is been much improvement in the wound volume. We have been using silver alginate and Hydrofera Blue. Less drainage. In passing the husband showed me an abrasion injury on the left anterior tibia. Covered in necrotic surface. He has noticed this for about a week and has been putting silver collagen on it. He is completely uncertain about how this happened 1/25; monthly follow-up. The area on the left buttock is about the same. This does not go to bone but a fairly deep wound surface of the wound is of questionable viability. The abrasion injury that they showed me last time apparently was closed out by home health because they thought it was healed but certainly is not although it is just about healed. As a result they haven't been applying anything to this area Finally I did discuss with the patient and her husband the idea of an advanced treatment product to try and get a proper base to this wound I was thinking of Puraply however actually the patient points out that her co-pay for coming to visit [place] i.e. the facility, charge would be unaffordable if they have to, on a weekly basis 2/22; pressure ulcer on the left buttock appears deeper to me and abuts on the ischial tuberosity. I thought initially there was exposed bone but there is a rim of tissue over this area. She also has a superficial over the right anterior mid tibia. Been using silver collagen to these areas without much success. I have looked over the patient's past history with regards to the area on the left ischium. She did have underlying osteomyelitis here dating back I think to late 2020. She saw Dr. Megan Salon she received a 6-week course of oral antibiotics in response to a bone culture that I did. This does not appear to be infected but it certainly has not been improving in terms of granulation. I do not believe she has had any recent imaging  studies 3/29; 1 month follow-up. Pressure ulcer on the left buttock which she has been dealing with with for a number of years. She was treated for underlying osteomyelitis at 1.2 or 3 years ago I think with infectious disease help. She also had I think a flap closure by Dr. Leland Johns and that lasted for about  a year and then reopened. I have not been able to get this patient to progress towards healing although truthfully the wound is absolutely no worse. We have been using Hydrofera Blue 4/26; patient presents for 1 week follow-up. She has been using silver collagen to the area every other day. She has home health that comes out once a week to help with dressing changes as well. The patient is interested in trying a skin substitute over this area. She states she is trying to relieve pressure off of it most of the day. 5/24; patient presents for 1 month follow-up. She has been using silver collagen to the area every other day. She has no complaints today. She is interested in the skin substitute. She tries to leave relieve pressure off Her bottom however is not able to most of the day 6/14; using silver collagen to the area over the left ischial tuberosity. Wound does not appear to be doing particularly well. Open to bone 6/28; the patient patient presented last time with a marked deterioration. Depth probing all the way to bone. The bone itself did not look particularly viable. In spite of this the x-ray I did showed longstanding ulcer over the left ischial tuberosity with chronic bone involvement/reaction that was also seen by CT in 2020. Lab work did not show just active infection with a sed rate of 14 and a C-reactive protein of 7.1. Her comprehensive metabolic panel was normal including an albumin of 4.1 white count was 6 7/19; patient was here 3 weeks ago with a marked deterioration in her wound over the left ischial tuberosity. I ordered a CT scan of the area for 1 reason or another this just  did not get done. It is now booked for 8 days from now. She was supposed to come back for bone culture and pathology. That did not happen either. We have been using silver collagen. As usual she is diligently looked after by her husband 8/24; 5-week follow-up. Since the patient was last here the biopsy that I did of her ischial tuberosity came back suggesting osteomyelitis. Culture showed strep. I started her on Augmentin. She was seen by infectious disease Dr. Lucianne Lei dam and the Augmentin was continued and she is still taking it. CT scan did not suggest anything other than chronic osteomyelitis without much change from her previous study. Her C-reactive protein was only 7 and sedimentation rate was 1.1. We are still using silver collagen to the wound. She has home health. Her husband is very diligent in her care for this reason I have never pursued a diverting colostomy. She would not agree to this surgery in any case. Finally we have discussed plastic surgery with him in the past and she is not interested in a myocutaneous flap. 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 Since she was last here she was in the ER last week with abdominal pain. She was found to be impacted however in the course of the review there somebody gave her some IV morphine and apparently she developed hives and blisters. There is still tense blisters on her plantar left fifth and fourth fingers 10/4; the patient has completed her Augmentin and is due to follow-up with Dr. Drucilla Schmidt in early November. Her wound today measures about the same but looks a little healthier in terms of granulation there is no exposed bone. I note that she was admitted to hospital for 2 days from 9/21 through 9/23 for delirium. She had received Versed  for glaucoma surgery ultimately that was felt to be the etiology. Her blood cultures were negative she had 30,000 colonies of Pseudomonas in her urine. She did receive broad-spectrum antibiotic therapy but  ultimately her wound on the buttock was not felt to be the cause. As mentioned she has completed her antibiotics still using silver collagen 11/1; patient has completed her antibiotics for the underlying osteomyelitis. She apparently follows up with Dr. Drucilla Schmidt next week. The wound does not look too bad perhaps slightly narrower in terms of width but the depth is about the same. We have been using Hydrofera Blue. The patient talks to me about a wound VAC and made it sound as though she was recently on 1 although I do not see this. The other option is an advanced treatment product like Oasis but that means weekly trips into the clinic. She has previously said she does not want an attempt at plastic surgery 11/15; the patient saw Dr. Drucilla Schmidt noted that her follow-up inflammatory markers normalized. She has completed her antibiotics. We are currently using Hydrofera Blue. Wound itself has some surface tissue over bone but certainly not a lot. I have looked back over her history. The patient has had recurrent osteomyelitis in this area as she is received prolonged courses of antibiotics. We did use a wound VAC for a prolonged period of time in 2021. We had some improvement especially in undermining areas but overall not a lot of measurable improvement. Once again I have tried to think about using advanced treatment products in this area something that would require them visiting the clinic very frequently and they did not seem to want to do this. Most recently we ran [facility] however she would have a $496 per application co-pay 75/9; left ischial tuberosity. I do not see much difference in 3 weeks. There is no exposed bone. We are using Hydrofera Blue. Our intake nurse reports some "greenish" drainage 12/21 left ischial tuberosity. Measuring slightly smaller in surface area. The wound still has some tissue over bone i.e. there is no exposed bone. No overt infection. We did a deep tissue culture last time for  PCR. The major bacteria was Pseudomonas although there were medium titers of Enterococcus faecalis Klebsiella E. coli and Morganella as well as Peptostreptococcus. Keystone antibiotic include streptomycin and vancomycin they got this last weekend and used it twice 04/17/2021; no change in this wound. It does not have undermining however the surface is of it does not look particularly viable. We have been using topical antibiotic directed at a PCR culture as well as silver alginate. It is clear in talking to the patient and her husband that she does not offload this area adequately including spending all night on this with I think a level 2 surface on her bed. I have told her that this is not adequate to heal a wound like this.. 2/15; Oasis with a $163 co-pay per application. They are using silver alginate to the wound offloading as best they can according to her husband 06/19/2021: At her last visit, Dr. Dellia Nims changed her to silver collagen, but apparently they did not receive this until just last week. They have continued using silver alginate in the wound. Today, the wound is quite malodorous with necrotic slough. They ran out of Pole Ojea topical about 2 months ago. No new cultures have been taken.. 07/17/2021: At the last visit, the wound was in pretty poor condition with a lot of necrotic slough and substantial malodorous drainage. I did take  a new culture for PCR, but for some reason this was never resulted. Nonetheless, they did receive a renewal of their [employer] apparently it was communicated to them that they should use silver alginate rather than the silver collagen. Regardless, the wound is much cleaner at this visit and there is no odor. 07/26/2021: The sacral/left ischial ulcer is fairly clean, but there was some greenish drainage appreciated on the dressing. They have been using topical Keystone with silver alginate. She has unfortunately developed a new ulcer on her right ischium. It is  unstageable with a thick layer of eschar. It is malodorous. 08/14/2021: I took a new PCR culture at her last visit from the new wound that had opened on her right ischial tuberosity. This returned with a polymicrobial population. A new topical Keystone compound was formulated. They have been using this on her wounds along with silver alginate. I also prescribed a course of oral antibiotic, Augmentin and Levaquin to try and address the multiple species with various resistance these that grew out. She was able to take this for about 10 days, but it has started to make her nauseated and she is actually thrown up on occasion. Since she takes them at the same time, she is not sure which one is causing her issue. She has also been having a lot more drainage from her left ischial wound. Her husband says that he has not been using the zinc oxide as much because it is difficult to wash off when he bathes her. We have been using Santyl to the new wound under silver alginate and silver alginate to the left ischial wound. 09/11/2021: Unfortunately, there has been fairly substantial breakdown of both of her wounds. It sounds like the gel compound that the [employer] was being mixed in just leaked out everywhere and left the patient wet and macerated. There is worsening necrotic tissue and increased depth on the right ischial wound and the left one has extended further. They have not been using the zinc oxide as recommended. They contacted [facility] and were advised to simply apply the powder directly to the wounds. 10/09/2021: The right ischial wound has some necrotic tissue on the lateral aspect and into the base, but no purulent drainage and no surrounding erythema or induration. On the left ischial wound, there is leathery nonviable skin extending laterally from the main wound. No significant odor or drainage from this site, either. Electronic Signature(s) Signed: 10/09/2021 11:39:27 AM By: [name] Maudlin MD  FACS Entered By: [name] on 10/09/2021 11:39:26 -------------------------------------------------------------------------------- Physical Exam Details Patient Name: Date of Service: [name]. 10/09/2021 10:30 A M Medical Record Number: [id number] Patient Account Number: [id number] Date of Birth/Sex: Treating RN: 02-06-1951 (71 y.o. [name] Primary Care Provider: Sandi [name] Other Clinician: Referring Provider: Treating Provider/Extender: Burgess Amor Weeks in Treatment: 144 Constitutional Hypertensive, asymptomatic. . . . No acute distress.Marland Kitchen Respiratory Normal work of breathing on room air.. Notes 10/09/2021: The right ischial wound has some necrotic tissue on the lateral aspect and into the base, but no purulent drainage and no surrounding erythema or induration. On the left ischial wound, there is leathery nonviable skin extending laterally from the main wound. No significant odor or drainage from this site, either. Electronic Signature(s) Signed: 10/09/2021 11:40:42 AM By: [name] Maudlin MD FACS Entered By: [name] on 10/09/2021 11:40:42 -------------------------------------------------------------------------------- Physician Orders Details Patient Name: Date of Service: [name], [name] Breach. 10/09/2021 10:30 A M Medical Record Number: [id number] Patient  Account Number: [id number] Date of Birth/Sex: Treating RN: 08-Mar-1951 (71 y.o. [name] Primary Care Provider: Sandi [name] Other Clinician: Referring Provider: Treating Provider/Extender: [name] in Treatment: [phone] Verbal / Phone Orders: No Diagnosis Coding ICD-10 Coding Code Description 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, stage 4 E11.622 Type 2 diabetes mellitus with other skin ulcer L89.313 Pressure ulcer of right buttock, stage 3 Follow-up Appointments Return appointment in 1 month. - Dr. Celine Ahr - Room 1 Bathing/ Shower/ Hygiene May shower  and wash wound with soap and water. - with dressing changes Edema Control - Lymphedema / SCD / Other Elevate legs to the level of the heart or above for 30 minutes daily and/or when sitting, a frequency of: - throughout the day Off-Loading Turn and reposition every 2 hours - *** Try to shift from side to side and shift to belly (if tolerated),****avoid sitting up in bed except for meals Additional Orders / Instructions Follow Nutritious Diet - protein shakes 2-3 times per day, Juven 2 times per day Home Health No change in wound care orders this week; continue Home Health for wound care. May utilize formulary equivalent dressing for wound treatment orders unless otherwise specified. Dressing changes to be completed by [place] on Monday / Wednesday / Friday except when patient has scheduled visit at [facility]. - increase visits to 3 times per week (spouse having difficulty with dressing changes) Other Home Health Orders/Instructions: - [facility] Wound Treatment Wound #12 - Ischial Tuberosity Wound Laterality: Left Cleanser: Wound Cleanser (DME) (Generic) 1 x Per Day/30 Days Discharge Instructions: Cleanse the wound with wound cleanser prior to applying a clean dressing using gauze sponges, not tissue or cotton balls. Cleanser: Soap and Water 1 x Per Day/30 Days Discharge Instructions: May shower and wash wound with dial antibacterial soap and water prior to dressing change. Cleanser: Wound Cleanser 1 x Per Day/30 Days Discharge Instructions: Cleanse the wound with wound cleanser prior to applying a clean dressing using gauze sponges, not tissue or cotton balls. Peri-Wound Care: Zinc Oxide Ointment 30g tube 1 x Per Day/30 Days Discharge Instructions: Apply Zinc Oxide to periwound with each dressing change Peri-Wound Care: Skin Prep 1 x Per Day/30 Days Discharge Instructions: Use skin prep as directed Topical: meropenem powder 1 x Per Day/30 Days Discharge Instructions: to wound  bed Prim Dressing: KerraCel Ag Gelling Fiber Dressing, 4x5 in (silver alginate) (Generic) 1 x Per Day/30 Days ary Discharge Instructions: Apply silver alginate to wound bed as instructed Secondary Dressing: Woven Gauze Sponge, Non-Sterile 4x4 in (Generic) 1 x Per Day/30 Days Discharge Instructions: Apply over primary dressing as directed. Secondary Dressing: Zetuvit Plus Silicone Border Dressing 5x5 (in/in) (Generic) 1 x Per Day/30 Days Discharge Instructions: Apply silicone border over primary dressing as directed. Secondary Dressing: Zetuvit Plus Silicone Border Dressing 7x7(in/in) (DME) (Dispense As Written) 1 x Per Day/30 Days Discharge Instructions: Apply silicone border over primary dressing as directed. Wound #15 - Ischium Wound Laterality: Right Cleanser: Soap and Water 1 x Per Day/30 Days Discharge Instructions: May shower and wash wound with dial antibacterial soap and water prior to dressing change. Cleanser: Wound Cleanser 1 x Per Day/30 Days Discharge Instructions: Cleanse the wound with wound cleanser prior to applying a clean dressing using gauze sponges, not tissue or cotton balls. Peri-Wound Care: Zinc Oxide Ointment 30g tube 1 x Per Day/30 Days Discharge Instructions: Apply Zinc Oxide to periwound with each dressing change Peri-Wound Care: Skin Prep 1 x Per Day/30 Days  Discharge Instructions: Use skin prep as directed Topical: meropenem powder 1 x Per Day/30 Days Discharge Instructions: to wound bed Prim Dressing: KerraCel Ag Gelling Fiber Dressing, 4x5 in (silver alginate) (Generic) 1 x Per Day/30 Days ary Discharge Instructions: Apply silver alginate to wound bed as instructed Secondary Dressing: Woven Gauze Sponge, Non-Sterile 4x4 in (Generic) 1 x Per Day/30 Days Discharge Instructions: Apply over primary dressing as directed. Secondary Dressing: Zetuvit Plus Silicone Border Dressing 5x5 (in/in) (Generic) 1 x Per Day/30 Days Discharge Instructions: Apply silicone  border or ABD padover primary dressing as directed. Electronic Signature(s) Signed: 10/09/2021 12:22:20 PM By: [name] Maudlin MD FACS Signed: 10/09/2021 5:32:47 PM By: [name] Gouty RN, BSN Entered By: [name] on 10/09/2021 11:42:04 -------------------------------------------------------------------------------- Problem List Details Patient Name: Date of Service: [name]. 10/09/2021 10:30 A M Medical Record Number: [id number] Patient Account Number: [id number] Date of Birth/Sex: Treating RN: March 09, 1951 (71 y.o. [name] Primary Care Provider: Sandi [name] Other Clinician: Referring Provider: Treating Provider/Extender: Burgess Amor Weeks in Treatment: 144 Active Problems ICD-10 Encounter Code Description Active Date MDM Diagnosis 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, stage 4 12/30/2018 No Yes E11.622 Type 2 diabetes mellitus with other skin ulcer 12/30/2018 No Yes L89.313 Pressure ulcer of right buttock, stage 3 07/26/2021 No Yes Inactive Problems ICD-10 Code Description Active Date Inactive Date G82.20 Paraplegia, unspecified 12/30/2018 12/30/2018 M86.68 Other chronic osteomyelitis, other site 02/17/2019 02/17/2019 S80.811D Abrasion, right lower leg, subsequent encounter 03/27/2020 03/27/2020 M86.68 Other chronic osteomyelitis, other site 11/28/2020 11/28/2020 L97.811 Non-pressure chronic ulcer of other part of right lower leg limited to breakdown of skin 03/27/2020 03/27/2020 Resolved Problems Electronic Signature(s) Signed: 10/09/2021 11:37:40 AM By: [name] Maudlin MD FACS Entered By: [name] on 10/09/2021 11:37:40 -------------------------------------------------------------------------------- Progress Note Details Patient Name: Date of Service: [name]. 10/09/2021 10:30 A M Medical Record Number: [id number] Patient Account Number: [id number] Date of Birth/Sex: Treating RN: Aug 16, 1950 (71 y.o. Martyn Malay,  Vaughan Basta Primary Care Provider: Sandi [name] Other Clinician: Referring Provider: Treating Provider/Extender: [name] in Treatment: 144 Subjective Chief Complaint Information obtained from Patient She is here for follow up evaluation of left ischial pressure ulcer 12/30/2018; patient comes back for review of wounds in the same general area over the previously healed left ischial pressure ulcer History of Present Illness (HPI) The following HPI elements were documented for the patient's wound: Location: open ulceration of the left gluteal area, left heel and right ankle for about 5 months. Quality: Patient reports No Pain. Severity: Patient states wound(s) are getting worse. Duration: Patient has had the wound for > 5 months prior to seeking treatment at the wound center Context: The wound occurred when the patient has been paraplegic for about 3 years. Modifying Factors: Wound improving due to current treatment. Associated Signs and Symptoms: Patient reports having foul odor. this 71 year old patient who is known to have hypertension, hypothyroidism, breast cancer, chronic pain syndrome, paraplegia was noted to have a left gluteal decubitus ulcer and was brought into the hospital. During the course of her hospitalization she was debrided in the operating room by ankle wound. Bone cultures were taken at that time but were negative but clinically she was treated for osteomyelitis because of the probing down to bone and open exposed bone. Home health has been giving her antibioticss which include vancomycin and Zosyn. The patient was a smoker until about 3 weeks ago and used to smoke about 10 cigarettes a day for a  long while. 12/13/2014 - details of her operative note from 11/03/2014 were reviewed -- PROCEDURE: 1. Excisional debridement skin, subcutaneous, muscle left ischium 35 cm2 2. Excisional debridement skin, subcutaneous tissue left heel 27 cm2 3. Excisional debridement  right ankle skin, subcutaneous, bone 30 cm2 01/24/2015 -- she has some issues with her wheelchair cushion but other than that is doing very well and has received Podus boots for her feet. 02/14/2015 -- she was using her old offloading boots and this seemed to have caused her a new pressure ulcer on the left posterior heel near the superior part just below the Achilles tendon. 03/07/2015 -- she has a new ulceration just to the left of the midline on her sacral region more on the left buttock and this has been there for Dr. Leland Johns and had all the wounds sharply debrided. The debridement was done for the left ischial wound, the left heel wound and the right about a week. 08/22/2015 -- was recently admitted to hospital between May 5 and 08/13/2015, with sepsis and leukocytosis due to a UTI. she was treated for a sepsis complicating Escherichia coli UTI and kidney stones. She also had metabolic and careful up at the secondary to pyelonephritis. He received broad-spectrum antibiotics initially and then received Macrobid as per urology. She was sent home on nitrofurantoin. during her admission she had a CT scan which showed exposed left ischial tuberosity without evidence of osteolysis. 09/12/2015-- the patient is having some issues with her air mattress and would like to get a opinion from medical modalities. 10/10/2015 -- the issue with her air mattress has not yet been sorted out and the new problem seems to be a lot of odor from the wound VAC. 11/27/2015 -- the patient was admitted to the hospital between July 23 and 10/31/2015. Her problems were sepsis, osteomyelitis of the pelvic bone and acute pyelonephritis. CT of the abdomen and pelvis was consistent with a left-sided pyelonephritis with hydronephrosis and also just showed new sclerosis of the posterior portion of the left anterior pubic ramus suggestive of periosteal reaction consistent with osteomyelitis. She was treated for the osteomyelitis  with infectious disease consult recommending 6 weeks of IV antibiotics including vancomycin and Rocephin and the antibiotics were to go on until 12/10/2015. He was seen by Dr. Iran Planas plastic surgery and Dr. Linus Salmons of infectious disease. She had a suprapubic catheter placed during the admission. CT scan done on 10/28/2015 showed specifically -- New sclerosis of the posterior portion of the left inferior pubic ramus with aggressive periosteal reaction, consistent with osteomyelitis, with adjacent soft tissue gas compatible with previously described decubitus ulcer. 12/12/2015 -- she was recently seen by Dr. Linus Salmons, who noted good improvement and CRP and ESR compared to before and he has stopped her antibiotic as per plans to finish on September 4. The patient was encouraged to continue with wound care and consider hyperbaric oxygen therapy. Today she tells me that she has consented to undergo hyperbaric oxygen therapy and we can start the paperwork. 01/02/2016 -- her PCP had gained about 3 years but she still persists in having problems during hyperbaric oxygen therapy with some discomfort in the ears. 01/09/16; pressure area with underlying osteomyelitis in the left buttock. Wound bed itself has some slight amount of grayish surface slough however I do not think any debridement was necessary. There is no exposed bone soft tissue appears stable. She is using a wound VAC 01/16/16; back for weekly wound review in conjunction with HBO. She has a deep  wound over the left initial tuberosity previously treated with 6 weeks of IV antibiotics for osteomyelitis. Wound bed looks reasonably healthy although the base of this is still precariously close to bone. She has been using a wound VAC. 01/23/2016 -- she has completed her course of antibiotics and this week the only new thing is her right great toe nail was avulsed and she has got an open wound over the nailbed. 01/31/16 she has completed her course of  antibiotics. Her right great toenail avulsed last week and she's been using silver alginate for this as well. 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 Did not sound like there was any optimistic discussion regarding reconstruction and the net result would be to continue with the wound VAC application. I will await the official reports. Addendum: -- she was seen at [facility] surgery [facility] by Dr. Tressa Busman. After a thorough review and from what I understand spending 45 minutes with the patient his assessment has been noted by me in detail and the management options were: 1. Continued pressure offloading and wound care versus operative procedures including wound excision 2. Soft tissue and bone sampling 3. If the wound gets larger wound closure would be done using a variety of plastic surgical techniques including but not limited to skin substitute, possible skin graft, local versus regional flaps, negative pressure dressing application. 4. He discussed with her details of flap surgery and the risks associated 5. He made a comment that since the patient was operated on by Dr. Leland Johns of [facility] plastic surgery unit in [place] the patient may continue to follow-up there for further evaluation for surgical flap closure in the future. 03/19/2016 -- the patient continues to be rather depressed and frustrated with her lack of rapid progress in healing this wound especially because she thought after hyperbaric oxygen therapy the wound would heal extremely fast. She now understands that was not the implied benefit on wound care which was the recommendation for hyperbaric oxygen therapy. I have had a lengthy discussion with the  patient and her husband regarding her options: 1. Continue with collagen and wound VAC for the primary dressing and offloading and all supportive care. 2. See Dr. Iran Planas for possible placement of Acell or Integra in the OR. 3. get a second opinion from a wound care center and surrounding regions/counties 05/07/2016 -- Note from Dr. Celedonio Miyamoto, who noted that the patient has declined flap surgery. She has discussed application of A cell, and try a few applications to see how the wound progresses. She is also recommended that we could apply products here in the wound center, like Oasis. during her preop workup it was found that her hemoglobin A1c was 11% and she has now been diagnosed as having diabetes mellitus and has been put on appropriate treatment by her PCP 05/28/2016 -- tells me her blood sugars have been doing well and she has an appointment to see her PCP in the next couple of weeks to check her hemoglobin A1c. Other than that she continues to do well. 06/25/2016 -- have not seen her back for the last month but she says her health has been about the same and she has an appointment to check the  A1c next week 09/10/16 ---- was seen by Dr. Celedonio Miyamoto -- who applied Acell and saw her back in follow-up. She has recommended silver alginate to the wound every other day and cover with foam. If no significant drainage could transition to collagen every other day. She recommended discontinuing wound VAC. There were no plans to repeat application of Acell. The patient expressed that her husband could do the wound care as going to the Wound Ctr[facility] would cost several $100 for each visit. 10/21/2016 -- her insurance company is getting her new mattress and she is pleased about that. Other than that she has been doing dressings with PolyMem Silver and has been doing very well 02/18/2017 -- she has gone through several changes of her mattress and has not been pleased with any of them. The  ventricles are still working on trying to fit her with the appropriate low air-loss mattress. She has a new wound on the gluteal area which is clearly separated from the original wound. 03/25/17-she is here in follow-up evaluation for her left ischialpressure ulcer. She remains unsatisfied with her pressure mattress. She admits to sitting multiple hours a day, in the bed. We have discussed offloading options. The wound does not appear infected. Nutrition does not appear to be a concern. Will follow-up in 4 weeks, if wound continues to be stalled may consider x-ray to evaluate for refractory osteomyelitis. 04/21/17; this is a patient that I don't know all that well. She has a chronic wound which at one point had underlying osteomyelitis in the left ischial tuberosity. This is a stage IV pressure ulcer. Over the last 3 months she has a stage II wound inferiorly to the original wound. The last time she was here her dressing was changed to silver collagen although the patient's husband who changes the dressing said that the collagen stuck to the wound and remove skin from the superficial area therefore he switched back to [place] 05/13/17; this is a patient we've been following for a left ischial tuberosity wound which was stage IV at one point had underlying osteomyelitis. Over the last several months she's had a stage II wound just inferior and medial to the related to the wound. According to her husband he is using Endoform layer with collagen although this is not what I had last time. According to her husband they are using Elgie [place] with collagen although I don't quite know how that started. She was hospitalized from 1/20 through 04/30/16. This was related to a UTI. Her blood cultures were negative, urine culture showed multiple species. She did have a CT scan of the abdomen and pelvis which documented chronic osteomyelitis in the area of the wound inflammatory markers were unremarkable. She has  had prior knowledge of osteomyelitis. It looks as though she received IV antibiotics in 2017 and was treated with a course of hyperbaric oxygen. 05/28/17; the wound over the left ischial tuberosity is deeper today and abuts clearly on bone. Nursing intake reported drainage. I therefore culture of the wound. The more superficial area just below this looks about the same. They once again complained that there are mattress cover is not working although apparently [facility] is been noted to see this many times in the report is that the device is functional 06/18/17; the patient had a probing area on the left ischial tuberosity that was draining purulent fluid last time. This also clearly seemed to have open bone. Culture I did showed pansensitive pseudomonas including third generation cephalosporins.  I treated this with cefdinir 300 twice a day for 10 days and things seem to have improved. She has a more superficial wound just underneath this area. Amazingly she has a new air mattress through [facility]. I think they gave this to her as a parking give. In any case this now works according to the patient may have something to do with why the areas are looking better. 07/09/17; the patient has a probing area in the left ischial tuberosity that still has some depth. However this is contracted in terms of the wound orifice although the depth is still roughly the same. There is no undermining. ooShe also has the satellite wound which is more superficial. This appears to have a healthy surface we've been using silver collagen 08/06/17; the patient's wound is over the left ischial tuberosity and a satellite lesion just underneath this. The original wound was actually a deep stage 4 wound. We have made good progress in 2 months and there is no longer exposed bone here. 09/03/17; left ischial tuberosity actually appears to be quite healthy. I think we are making progress. No debridement is required. There  is no surrounding erythema 10/01/17 I follow this patient monthly for her left ischial tuberosity wound. There is 2 areas the original area and a satellite area. The satellite area looks a lot better there is no surrounding erythema. Her husband relates that he is having trouble maintaining the dressing. This has to do with the soft tissue around it. He states he puts the collagen in but he cannot make sure that it stays in even with the ABD pads and tape that he is been using 10/29/17; patient arrives with a better looking noon today. Some of the satellite lesions have closed. using Prisma 11/26/17; the patient has a large cone-shaped area with the tip of the Cone deep within her buttock soft tissue. The walls of the Cone are epithelialized however the base is still open. The area at the base of this looks moist we've been using silver collagen. Will change to silver alginate 12/31/2017; the wound appears to have come in fairly nicely. Using silver alginate. There is no surrounding maceration or infection 01/28/18; there is still an open area here over the left initial tuberosity. Base of this however looks healthy. There is no surrounding infection 02/25/18; the area of its open is over the left ischial tuberosity. The base of this is where the wound is. This is a large inverted cone-shaped area with the wound at the tip. Dimensions of the wound at the tip are improved. There is a area of denuded skin about halfway towards the tip which her husband thinks may have happened today when he was bathing her. 04/20/17; the area is still open over the left initial tuberosity. 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 This is now completely closed over although it is closed over with a divot and skin over bone at the  base of this. Continued aggressive offloading will be necessary. 12/30/2018 READMISSION This is a 71 year old woman with chronic paraplegia. I picked her up for her care from Dr. Con Memos in this clinic after he departed. She had a stage IV pressure wound over the left ischial tuberosity. She was treated twice for her underlying osteomyelitis and this I  believe firstly in 2016 and again in 2017. There were some plans at some point for flap closure of this however she was discovered to have uncontrolled diabetes and I do not think this was ever accomplished. She ultimately healed over in this clinic and was discharged in May. She has a large cone-shaped indentation with the tip of this going towards the left ischial tuberosity. It is not an easy area to examine but at that time I thought all of this was epithelialized. Apparently there was a reopening here shortly after she left the clinic last time. She was admitted to hospital at the end of June for Klebsiella bacteremia felt to be secondary to UTI. A CT scan of the pelvis is listed below and there was initially some concern that she had underlying osteomyelitis although I believe she was seen by infectious disease and that was felt to be not the case: I do not see any new cultures or inflammatory markers IMPRESSION: 1. No CT evidence for acute intra-abdominal or pelvic abnormality. Large volume of stool throughout the colon. 2. Enlarged fatty liver with fat sparing near the gallbladder fossa 3. Cortical scarring right kidney. Bilateral intrarenal stones without hydronephrosis. Thick-walled urinary bladder decompressed by suprapubic catheter 4. Deep left decubitus ulcer with underlying left ischial changes suggesting osteomyelitis. Her husband has been using silver collagen in the wound. She has not been systemically unwell no fever chills eating and drinking well. They rigorously offload this wound only getting up in the wheelchair when she is  going to appointments the rest of the time she is in bed. 10/8; wound measures larger and she now has exposed bone. We have been using silver alginate 11/12 still using silver alginate. The patient saw Dr. Megan Salon of infectious disease. She was started on Augmentin 500 mg twice daily. She is due to follow-up with Dr. Megan Salon I believe next week. Lab work Dr. Megan Salon requested showed a sedimentation rate of 28 and CRP of 20 although her CRP 1 year ago was 18.8. Sedimentation rate 1 year ago was 11 basic metabolic panel showed a creatinine of 1.12 12/3; the patient followed up with Dr. Megan Salon yesterday. She is still on Augmentin twice daily. This was directed by Dr. Megan Salon. The patient's inflammatory markers have improved which is gratifying. Her C-reactive protein was repeated yesterday and follow-up booked with infectious disease in January. In addition I have been getting secure text messages I think from palliative care through [facility] [employer]. I think they were hoping to provide services to the patient in her home. They could not get a hold of the primary physician and so they reached out to me on 2 separate occasions. 12/17; patient last saw Dr. Megan Salon on 12/2. She is finishing up with Augmentin. Her C-reactive protein was 20 on 10/21, 10.1 on 11/19 and 17 on 12/2. The wound itself still has depth and undermining. We are using Santyl with the backing wet-to-dry 04/27/2019. The wound is gradually clearing up in terms of the surface although it is not filled in that much. Still abuts right against bone 2/4; patient with a deep pressure ulcer over the left ischial tuberosity. I thought she was going to follow-up with infectious disease to follow her inflammatory markers although the patient states that they stated that they did not need to see her unless we felt it was necessary. I will need to check their notes. In any case we ordered moistened silver collagen back  with wet-to-dry to  fill in the depth of the wound although apparently [facility] sent silver alginate which they have been using since they were here the last time. Is obviously not what we ordered. 2/25. Not much change in this wound it is over the left ischial tuberosity recurrent wound. We have been using silver collagen with backing wet-to-dry. I think the wound is about the same. There is still some tunneling from about 10-12 o'clock over the ischial tuberosity itself 3/11; pressure ulcer over the left ischial tuberosity. Since she was last here the wound VAC was started and apparently going quite well. We are able to get the home health company that accepts [place] healthcare which is in itself sometimes problematic. There is been improvements in the wound the tunneling seems to be better and is contracted nicely 4/8; 1 month follow-up. Since she was last here we have been using silver collagen under a wound VAC. Some minor contraction I think in wound volume. She is cared for diligently by her husband including pressure relief, incontinence management, nutritional support etc. 6/1; this is almost a 35-month follow-up. She is been using silver collagen under wound VAC. Circular area over the left ischial tuberosity. She has been using silver collagen under wound VAC 7/8; 1 month follow-up. Silver collagen under the VAC not really a lot of progress. Tissue at the base of the wound which is right against bone and the tissue next that this does not look completely viable. She is not currently on any antibiotics, she had underlying osteomyelitis I need to look this over 8/16; we are using silver collagen under wound VAC to the left ischial tuberosity wound. Comes in today with absolutely no change in surface area or depth. There is no exposed bone. I did look over her infectious disease notes as I said I would do last time. She last saw Dr. Megan Salon in December 2020. She completed 6 weeks of Augmentin.  This was in response to a bone culture I did showing methicillin susceptible staph aureus and Enterococcus. She was supposed to come back to see Dr. Megan Salon at some point although they say that that appointment was canceled unless I chose to recommend return. I think there was supposed to be follow-up with inflammatory markers but I cannot see that that was ever done. She has not been on antibiotics since 9/21; monthly follow-up. We received a call from home health nurse last evening to report green drainage coming out of the wound. Lab work I ordered last time showed a white count of 5.2 a sedimentation rate of 45 and a C-reactive protein of 25 however neither one of the 2 values are substantially different from her previous values in October 2020 or December 2020. Both are slightly higher but only marginally. Otherwise no new complaints from the patient or her husband 10/19; 1 month follow-up. PCR culture I did last time showed medium quantities of Pseudomonas lower quantities Klebsiella and Enterococcus faecalis group B strep and Peptostreptococcus. I gave her Augmentin for 2 weeks. I am not really sure of my choice of this I would not cover Pseudomonas. She is still having green drainage. Wound itself looks satisfactory there is not a lot of depth wound bed looks healthy 11/16; patient has completed the antibiotics still using gentamicin and silver alginate on the wound. There is improvement in the surface area 12/21; in general the area on the buttock looks somewhat better. Surface looks healthy although I do not know that there is been much improvement in  the wound volume. We have been using silver alginate and Hydrofera Blue. Less drainage. In passing the husband showed me an abrasion injury on the left anterior tibia. Covered in necrotic surface. He has noticed this for about a week and has been putting silver collagen on it. He is completely uncertain about how this happened 1/25; monthly  follow-up. The area on the left buttock is about the same. This does not go to bone but a fairly deep wound surface of the wound is of questionable viability. The abrasion injury that they showed me last time apparently was closed out by home health because they thought it was healed but certainly is not although it is just about healed. As a result they haven't been applying anything to this area Finally I did discuss with the patient and her husband the idea of an advanced treatment product to try and get a proper base to this wound I was thinking of Puraply however actually the patient points out that her co-pay for coming to visit [place] i.e. the facility, charge would be unaffordable if they have to, on a weekly basis 2/22; pressure ulcer on the left buttock appears deeper to me and abuts on the ischial tuberosity. I thought initially there was exposed bone but there is a rim of tissue over this area. She also has a superficial over the right anterior mid tibia. Been using silver collagen to these areas without much success. I have looked over the patient's past history with regards to the area on the left ischium. She did have underlying osteomyelitis here dating back I think to late 2020. She saw Dr. Megan Salon she received a 6-week course of oral antibiotics in response to a bone culture that I did. This does not appear to be infected but it certainly has not been improving in terms of granulation. I do not believe she has had any recent imaging studies 3/29; 1 month follow-up. Pressure ulcer on the left buttock which she has been dealing with with for a number of years. She was treated for underlying osteomyelitis at 1.2 or 3 years ago I think with infectious disease help. She also had I think a flap closure by Dr. Leland Johns and that lasted for about a year and then reopened. I have not been able to get this patient to progress towards healing although truthfully the wound is absolutely no worse. We  have been using Hydrofera Blue 4/26; patient presents for 1 week follow-up. She has been using silver collagen to the area every other day. She has home health that comes out once a week to help with dressing changes as well. The patient is interested in trying a skin substitute over this area. She states she is trying to relieve pressure off of it most of the day. 5/24; patient presents for 1 month follow-up. She has been using silver collagen to the area every other day. She has no complaints today. She is interested in the skin substitute. She tries to leave relieve pressure off Her bottom however is not able to most of the day 6/14; using silver collagen to the area over the left ischial tuberosity. Wound does not appear to be doing particularly well. Open to bone 6/28; the patient patient presented last time with a marked deterioration. Depth probing all the way to bone. The bone itself did not look particularly viable. In spite of this the x-ray I did showed longstanding ulcer over the left ischial tuberosity with chronic bone involvement/reaction that  was also seen by CT in 2020. Lab work did not show just active infection with a sed rate of 14 and a C-reactive protein of 7.1. Her comprehensive metabolic panel was normal including an albumin of 4.1 white count was 6 7/19; patient was here 3 weeks ago with a marked deterioration in her wound over the left ischial tuberosity. I ordered a CT scan of the area for 1 reason or another this just did not get done. It is now booked for 8 days from now. She was supposed to come back for bone culture and pathology. That did not happen either. We have been using silver collagen. As usual she is diligently looked after by her husband 8/24; 5-week follow-up. Since the patient was last here the biopsy that I did of her ischial tuberosity came back suggesting osteomyelitis. Culture showed strep. I started her on Augmentin. She was seen by infectious disease  Dr. Lucianne Lei dam and the Augmentin was continued and she is still taking it. CT scan did not suggest anything other than chronic osteomyelitis without much change from her previous study. Her C-reactive protein was only 7 and sedimentation rate was 1.1. We are still using silver collagen to the wound. She has home health. Her husband is very diligent in her care for this reason I have never pursued a diverting colostomy. She would not agree to this surgery in any case. Finally we have discussed plastic surgery with him in the past and she is not interested in a myocutaneous flap. 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 Since she was last here she was in the ER last week with abdominal pain. She was found to be impacted however in the course of the review there somebody gave her some IV morphine and apparently she developed hives and blisters. There is still tense blisters on her plantar left fifth and fourth fingers 10/4; the patient has completed her Augmentin and is due to follow-up with Dr. Drucilla Schmidt in early November. Her wound today measures about the same but looks a little healthier in terms of granulation there is no exposed bone. I note that she was admitted to hospital for 2 days from 9/21 through 9/23 for delirium. She had received Versed for glaucoma surgery ultimately that was felt to be the etiology. Her blood cultures were negative she had 30,000 colonies of Pseudomonas in her urine. She did receive broad-spectrum antibiotic therapy but ultimately her wound on the buttock was not felt to be the cause. As mentioned she has completed her antibiotics still using silver collagen 11/1; patient has completed her antibiotics for the underlying osteomyelitis. She apparently follows up with Dr. Drucilla Schmidt next week. The wound does not look too bad perhaps slightly narrower in terms of width but the depth is about the same. We have been using Hydrofera Blue. The patient talks to me about a wound VAC and  made it sound as though she was recently on 1 although I do not see this. The other option is an advanced treatment product like Oasis but that means weekly trips into the clinic. She has previously said she does not want an attempt at plastic surgery 11/15; the patient saw Dr. Drucilla Schmidt noted that her follow-up inflammatory markers normalized. She has completed her antibiotics. We are currently using Hydrofera Blue. Wound itself has some surface tissue over bone but certainly not a lot. I have looked back over her history. The patient has had recurrent osteomyelitis in this area as she  is received prolonged courses of antibiotics. We did use a wound VAC for a prolonged period of time in 2021. We had some improvement especially in undermining areas but overall not a lot of measurable improvement. Once again I have tried to think about using advanced treatment products in this area something that would require them visiting the clinic very frequently and they did not seem to want to do this. Most recently we ran [place] however she would have a $469 per application co-pay 62/9; left ischial tuberosity. I do not see much difference in 3 weeks. There is no exposed bone. We are using Hydrofera Blue. Our intake nurse reports some "greenish" drainage 12/21 left ischial tuberosity. Measuring slightly smaller in surface area. The wound still has some tissue over bone i.e. there is no exposed bone. No overt infection. We did a deep tissue culture last time for PCR. The major bacteria was Pseudomonas although there were medium titers of Enterococcus faecalis Klebsiella E. coli and Morganella as well as Peptostreptococcus. Keystone antibiotic include streptomycin and vancomycin they got this last weekend and used it twice 04/17/2021; no change in this wound. It does not have undermining however the surface is of it does not look particularly viable. We have been using topical antibiotic directed at a PCR culture as  well as silver alginate. It is clear in talking to the patient and her husband that she does not offload this area adequately including spending all night on this with I think a level 2 surface on her bed. I have told her that this is not adequate to heal a wound like this.. 2/15; Oasis with a $528 co-pay per application. They are using silver alginate to the wound offloading as best they can according to her husband 06/19/2021: At her last visit, Dr. Dellia Nims changed her to silver collagen, but apparently they did not receive this until just last week. They have continued using silver alginate in the wound. Today, the wound is quite malodorous with necrotic slough. They ran out of [place] topical about 2 months ago. No new cultures have been taken.. 07/17/2021: At the last visit, the wound was in pretty poor condition with a lot of necrotic slough and substantial malodorous drainage. I did take a new culture for PCR, but for some reason this was never resulted. Nonetheless, they did receive a renewal of their [employer] apparently it was communicated to them that they should use silver alginate rather than the silver collagen. Regardless, the wound is much cleaner at this visit and there is no odor. 07/26/2021: The sacral/left ischial ulcer is fairly clean, but there was some greenish drainage appreciated on the dressing. They have been using topical Keystone with silver alginate. She has unfortunately developed a new ulcer on her right ischium. It is unstageable with a thick layer of eschar. It is malodorous. 08/14/2021: I took a new PCR culture at her last visit from the new wound that had opened on her right ischial tuberosity. This returned with a polymicrobial population. A new topical Keystone compound was formulated. They have been using this on her wounds along with silver alginate. I also prescribed a course of oral antibiotic, Augmentin and Levaquin to try and address the multiple species with  various resistance these that grew out. She was able to take this for about 10 days, but it has started to make her nauseated and she is actually thrown up on occasion. Since she takes them at the same time, she is not sure  which one is causing her issue. She has also been having a lot more drainage from her left ischial wound. Her husband says that he has not been using the zinc oxide as much because it is difficult to wash off when he bathes her. We have been using Santyl to the new wound under silver alginate and silver alginate to the left ischial wound. 09/11/2021: Unfortunately, there has been fairly substantial breakdown of both of her wounds. It sounds like the gel compound that the [employer] was being mixed in just leaked out everywhere and left the patient wet and macerated. There is worsening necrotic tissue and increased depth on the right ischial wound and the left one has extended further. They have not been using the zinc oxide as recommended. They contacted [facility] and were advised to simply apply the powder directly to the wounds. 10/09/2021: The right ischial wound has some necrotic tissue on the lateral aspect and into the base, but no purulent drainage and no surrounding erythema or induration. On the left ischial wound, there is leathery nonviable skin extending laterally from the main wound. No significant odor or drainage from this site, either. Patient History Information obtained from Patient. Family History Unknown History, Cancer - Mother, Diabetes - Maternal Grandparents, Heart Disease - Mother, Hypertension - Mother, Thyroid Problems - Mother, No family history of Hereditary Spherocytosis, Kidney Disease, Lung Disease, Seizures, Stroke, Tuberculosis. Social History Former smoker - ended on 11/06/2014, Marital Status - Married, Alcohol Use - Never, Drug Use - No History, Caffeine Use - Daily - T soda. ea, Medical History Eyes Denies history of Cataracts, Glaucoma, Optic  Neuritis Ear/Nose/Mouth/Throat Denies history of Chronic sinus problems/congestion, Middle ear problems Hematologic/Lymphatic Patient has history of Anemia Denies history of Hemophilia, Human Immunodeficiency Virus, Lymphedema, Sickle Cell Disease Respiratory Denies history of Aspiration, Asthma, Chronic Obstructive Pulmonary Disease (COPD), Pneumothorax, Sleep Apnea, Tuberculosis Cardiovascular Patient has history of Hypertension Denies history of Angina, Arrhythmia, Congestive Heart Failure, Coronary Artery Disease, Deep Vein Thrombosis, Hypotension, Myocardial Infarction, Peripheral Arterial Disease, Peripheral Venous Disease, Phlebitis, Vasculitis Gastrointestinal Denies history of Cirrhosis , Colitis, Crohnoos, Hepatitis A, Hepatitis B, Hepatitis C Endocrine Patient has history of Type II Diabetes Denies history of Type I Diabetes Genitourinary Denies history of End Stage Renal Disease Immunological Denies history of Lupus Erythematosus, Raynaudoos, Scleroderma Integumentary (Skin) Denies history of History of Burn Musculoskeletal Patient has history of Osteoarthritis Denies history of Gout, Rheumatoid Arthritis, Osteomyelitis Neurologic Patient has history of Dementia, Paraplegia Denies history of Neuropathy, Quadriplegia Oncologic Patient has history of Received Radiation - 2012 - right breast Psychiatric Denies history of Anorexia/bulimia, Confinement Anxiety Hospitalization/Surgery History - left knee replacement. - right breast lumpectomy. - cervical laminectomy with fusion. - hysterectomy. - tear duct surgery. - T and A. - UTI. - suprapubic cath placement. - UTI. Medical A Surgical History Notes nd Gastrointestinal constipation GERD Genitourinary UTI kidney stones neurogenic bladder Integumentary (Skin) left gluteal fold sacral Musculoskeletal cervical neuropathy osteomyelitis Psychiatric admitted for suicide risk on  12/02/2018 Objective Constitutional Hypertensive, asymptomatic. No acute distress.. Vitals Time Taken: 10:40 AM, Height: 63 in, Weight: 185 lbs, BMI: 32.8, Temperature: 97.6 F, Pulse: 94 bpm, Respiratory Rate: 18 breaths/min, Blood Pressure: 150/76 mmHg. Respiratory Normal work of breathing on room air.. General Notes: 10/09/2021: The right ischial wound has some necrotic tissue on the lateral aspect and into the base, but no purulent drainage and no surrounding erythema or induration. On the left ischial wound, there is leathery nonviable skin extending laterally from the  main wound. No significant odor or drainage from this site, either. Integumentary (Hair, Skin) Wound #12 status is Open. Original cause of wound was Pressure Injury. The date acquired was: 09/06/2018. The wound has been in treatment 144 weeks. The wound is located on the Left Ischial Tuberosity. The wound measures 2.5cm length x 8.8cm width x 1.4cm depth; 17.279cm^2 area and 24.19cm^3 volume. There is Fat Layer (Subcutaneous Tissue) exposed. There is no tunneling noted, however, there is undermining starting at 4:00 and ending at 6:00 with a maximum distance of 2cm. There is a large amount of serosanguineous drainage noted. The wound margin is well defined and not attached to the wound base. There is small (1-33%) red, pink granulation within the wound bed. There is a large (67-100%) amount of necrotic tissue within the wound bed including Eschar and Adherent Slough. Wound #15 status is Open. Original cause of wound was Pressure Injury. The date acquired was: 07/21/2021. The wound has been in treatment 10 weeks. The wound is located on the Right Ischium. The wound measures 2.5cm length x 2.1cm width x 0.7cm depth; 4.123cm^2 area and 2.886cm^3 volume. There is Fat Layer (Subcutaneous Tissue) exposed. There is no tunneling or undermining noted. There is a medium amount of serosanguineous drainage noted. The wound margin is flat and  intact. There is small (1-33%) red granulation within the wound bed. There is a large (67-100%) amount of necrotic tissue within the wound bed including Eschar and Adherent Slough. Assessment Active Problems ICD-10 Pressure ulcer of left buttock, stage 4 Type 2 diabetes mellitus with other skin ulcer Pressure ulcer of right buttock, stage 3 Procedures Wound #12 Pre-procedure diagnosis of Wound #12 is a Pressure Ulcer located on the Left Ischial Tuberosity . There was a Excisional Skin/Subcutaneous Tissue Debridement with a total area of 22 sq cm performed by [name] Maudlin, MD. With the following instrument(s): Blade, Curette, Forceps, and Scissors to remove Viable and Non-Viable tissue/material. Material removed includes Fat, Eschar, Subcutaneous Tissue, and Slough after achieving pain control using Lidocaine 5% topical ointment. No specimens were taken. A time out was conducted at 11:00, prior to the start of the procedure. A Minimum amount of bleeding was controlled with Pressure. The procedure was tolerated well with a pain level of Insensate throughout and a pain level of Insensate following the procedure. Post Debridement Measurements: 2.5cm length x 8.8cm width x 1.4cm depth; 24.19cm^3 volume. Post debridement Stage noted as Category/Stage IV. Character of Wound/Ulcer Post Debridement requires further debridement. Post procedure Diagnosis Wound #12: Same as Pre-Procedure Wound #15 Pre-procedure diagnosis of Wound #15 is a Pressure Ulcer located on the Right Ischium . There was a Excisional Skin/Subcutaneous Tissue Debridement with a total area of 5.25 sq cm performed by [name] Maudlin, MD. With the following instrument(s): Curette, Forceps, and Scissors to remove Viable and Non- Viable tissue/material. Material removed includes Fat, Eschar, Subcutaneous Tissue, and Slough after achieving pain control using Lidocaine 5% topical ointment. No specimens were taken. A time out was  conducted at 11:00, prior to the start of the procedure. A Minimum amount of bleeding was controlled with Pressure. The procedure was tolerated well with a pain level of Insensate throughout and a pain level of Insensate following the procedure. Post Debridement Measurements: 2.5cm length x 2.1cm width x 0.7cm depth; 2.886cm^3 volume. Post debridement Stage noted as Category/Stage III. Character of Wound/Ulcer Post Debridement requires further debridement. Post procedure Diagnosis Wound #15: Same as Pre-Procedure Plan Follow-up Appointments: Return appointment in 1 month. - Dr. Celine Ahr -  Room 1 Bathing/ Shower/ Hygiene: May shower and wash wound with soap and water. - with dressing changes Edema Control - Lymphedema / SCD / Other: Elevate legs to the level of the heart or above for 30 minutes daily and/or when sitting, a frequency of: - throughout the day Off-Loading: Turn and reposition every 2 hours - *** Try to shift from side to side and shift to belly (if tolerated),****avoid sitting up in bed except for meals Additional Orders / Instructions: Follow Nutritious Diet - protein shakes 2-3 times per day, Juven 2 times per day Home Health: No change in wound care orders this week; continue Home Health for wound care. May utilize formulary equivalent dressing for wound treatment orders unless otherwise specified. Dressing changes to be completed by [place] on Monday / Wednesday / Friday except when patient has scheduled visit at [facility] - increase visits to 3 times per week (spouse having difficulty with dressing changes) Other Home Health Orders/Instructions: - [facility] WOUND #12: - Ischial Tuberosity Wound Laterality: Left Cleanser: Wound Cleanser (DME) (Generic) 1 x Per Day/30 Days Discharge Instructions: Cleanse the wound with wound cleanser prior to applying a clean dressing using gauze sponges, not tissue or cotton balls. Cleanser: Soap and Water 1 x Per Day/30  Days Discharge Instructions: May shower and wash wound with dial antibacterial soap and water prior to dressing change. Cleanser: Wound Cleanser 1 x Per Day/30 Days Discharge Instructions: Cleanse the wound with wound cleanser prior to applying a clean dressing using gauze sponges, not tissue or cotton balls. Peri-Wound Care: Zinc Oxide Ointment 30g tube 1 x Per Day/30 Days Discharge Instructions: Apply Zinc Oxide to periwound with each dressing change Peri-Wound Care: Skin Prep 1 x Per Day/30 Days Discharge Instructions: Use skin prep as directed Topical: meropenem powder 1 x Per Day/30 Days Discharge Instructions: to wound bed Prim Dressing: KerraCel Ag Gelling Fiber Dressing, 4x5 in (silver alginate) (Generic) 1 x Per Day/30 Days ary Discharge Instructions: Apply silver alginate to wound bed as instructed Secondary Dressing: Woven Gauze Sponge, Non-Sterile 4x4 in (Generic) 1 x Per Day/30 Days Discharge Instructions: Apply over primary dressing as directed. Secondary Dressing: Zetuvit Plus Silicone Border Dressing 5x5 (in/in) (Generic) 1 x Per Day/30 Days Discharge Instructions: Apply silicone border over primary dressing as directed. Secondary Dressing: Zetuvit Plus Silicone Border Dressing 7x7(in/in) (DME) (Generic) 1 x Per Day/30 Days Discharge Instructions: Apply silicone border over primary dressing as directed. WOUND #15: - Ischium Wound Laterality: Right Cleanser: Soap and Water 1 x Per Day/30 Days Discharge Instructions: May shower and wash wound with dial antibacterial soap and water prior to dressing change. Cleanser: Wound Cleanser 1 x Per Day/30 Days Discharge Instructions: Cleanse the wound with wound cleanser prior to applying a clean dressing using gauze sponges, not tissue or cotton balls. Peri-Wound Care: Zinc Oxide Ointment 30g tube 1 x Per Day/30 Days Discharge Instructions: Apply Zinc Oxide to periwound with each dressing change Peri-Wound Care: Skin Prep 1 x Per  Day/30 Days Discharge Instructions: Use skin prep as directed Topical: meropenem powder 1 x Per Day/30 Days Discharge Instructions: to wound bed Prim Dressing: KerraCel Ag Gelling Fiber Dressing, 4x5 in (silver alginate) (Generic) 1 x Per Day/30 Days ary Discharge Instructions: Apply silver alginate to wound bed as instructed Secondary Dressing: Woven Gauze Sponge, Non-Sterile 4x4 in (Generic) 1 x Per Day/30 Days Discharge Instructions: Apply over primary dressing as directed. Secondary Dressing: Zetuvit Plus Silicone Border Dressing 5x5 (in/in) (Generic) 1 x Per Day/30  Days Discharge Instructions: Apply silicone border or ABD padover primary dressing as directed. 10/09/2021: The right ischial wound has some necrotic tissue on the lateral aspect and into the base, but no purulent drainage and no surrounding erythema or induration. On the left ischial wound, there is leathery nonviable skin extending laterally from the main wound. No significant odor or drainage from this site, either. I used a combination of forceps, Metzenbaum scissors, scalpel, and curette to debride the nonviable tissue from each of these wounds. Although we are not yet into the muscle layer on either side, we are getting close. We will continue using the topical antibiotic with silver alginate. Continue aggressive offloading and protein intake. Follow-up in 1 month. Electronic Signature(s) Signed: 10/09/2021 11:42:48 AM By: [name] Maudlin MD FACS Entered By: [name] on 10/09/2021 11:42:48 -------------------------------------------------------------------------------- HxROS Details Patient Name: Date of Service: [name], [name] Breach. 10/09/2021 10:30 A M Medical Record Number: [id number] Patient Account Number: [id number] Date of Birth/Sex: Treating RN: January 31, 1951 (71 y.o. [name] Primary Care Provider: Sandi [name] Other Clinician: Referring Provider: Treating Provider/Extender: [name] in Treatment: 144 Information Obtained From Patient Eyes Medical History: Negative for: Cataracts; Glaucoma; Optic Neuritis Ear/Nose/Mouth/Throat Medical History: Negative for: Chronic sinus problems/congestion; Middle ear problems Hematologic/Lymphatic Medical History: Positive for: Anemia Negative for: Hemophilia; Human Immunodeficiency Virus; Lymphedema; Sickle Cell Disease Respiratory Medical History: Negative for: Aspiration; Asthma; Chronic Obstructive Pulmonary Disease (COPD); Pneumothorax; Sleep Apnea; Tuberculosis Cardiovascular Medical History: Positive for: Hypertension Negative for: Angina; Arrhythmia; Congestive Heart Failure; Coronary Artery Disease; Deep Vein Thrombosis; Hypotension; Myocardial Infarction; Peripheral Arterial Disease; Peripheral Venous Disease; Phlebitis; Vasculitis Gastrointestinal Medical History: Negative for: Cirrhosis ; Colitis; Crohns; Hepatitis A; Hepatitis B; Hepatitis C Past Medical History Notes: constipation GERD Endocrine Medical History: Positive for: Type II Diabetes Negative for: Type I Diabetes Time with diabetes: 1 year Treated with: Oral agents Blood sugar tested every day: No Genitourinary Medical History: Negative for: End Stage Renal Disease Past Medical History Notes: UTI kidney stones neurogenic bladder Immunological Medical History: Negative for: Lupus Erythematosus; Raynauds; Scleroderma Integumentary (Skin) Medical History: Negative for: History of Burn Past Medical History Notes: left gluteal fold sacral Musculoskeletal Medical History: Positive for: Osteoarthritis Negative for: Gout; Rheumatoid Arthritis; Osteomyelitis Past Medical History Notes: cervical neuropathy osteomyelitis Neurologic Medical History: Positive for: Dementia; Paraplegia Negative for: Neuropathy; Quadriplegia Oncologic Medical History: Positive for: Received Radiation - 2012 - right breast Psychiatric Medical  History: Negative for: Anorexia/bulimia; Confinement Anxiety Past Medical History Notes: admitted for suicide risk on 12/02/2018 Immunizations Pneumococcal Vaccine: Received Pneumococcal Vaccination: Yes Received Pneumococcal Vaccination On or After 60th Birthday: No Implantable Devices None Hospitalization / Surgery History Type of Hospitalization/Surgery left knee replacement right breast lumpectomy cervical laminectomy with fusion hysterectomy tear duct surgery Tand A UTI suprapubic cath placement UTI Family and Social History Unknown History: Yes; Cancer: Yes - Mother; Diabetes: Yes - Maternal Grandparents; Heart Disease: Yes - Mother; Hereditary Spherocytosis: No; Hypertension: Yes - Mother; Kidney Disease: No; Lung Disease: No; Seizures: No; Stroke: No; Thyroid Problems: Yes - Mother; Tuberculosis: No; Former smoker - ended on 11/06/2014; Marital Status - Married; Alcohol Use: Never; Drug Use: No History; Caffeine Use: Daily - T soda; Financial Concerns: No; ea, Food, Clothing or Shelter Needs: No; Support System Lacking: No; Transportation Concerns: No Engineer, maintenance) Signed: 10/09/2021 12:22:20 PM By: [name] Maudlin MD FACS Signed: 10/09/2021 5:32:47 PM By: [name] Gouty RN, BSN Entered By: [name] on 10/09/2021 11:39:33 -------------------------------------------------------------------------------- SuperBill Details Patient Name:  Date of Service: MURL, ZOGG 10/09/2021 Medical Record Number: [id number] Patient Account Number: [id number] Date of Birth/Sex: Treating RN: 07-17-1950 (71 y.o. Martyn Malay, Vaughan Basta Primary Care Provider: Sandi [name] Other Clinician: Referring Provider: Treating Provider/Extender: Burgess Amor Weeks in Treatment: 144 Diagnosis Coding ICD-10 Codes Code Description [phone] Pressure ulcer of left buttock, stage 4 E11.622 Type 2 diabetes mellitus with other skin ulcer L89.313 Pressure ulcer of right  buttock, stage 3 Facility Procedures The patient participates with Medicare or their insurance follows the Medicare Facility Guidelines: CPT4 Code Description Modifier Quantity [id number] 11042 - DEB SUBQ TISSUE 20 SQ CM/< 1 ICD-10 Diagnosis Description 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,  stage 4 L89.313 Pressure ulcer of right buttock, stage 3 The patient participates with Medicare or their insurance follows the Medicare Facility Guidelines: [id number] 11045 - DEB SUBQ TISS EA ADDL 20CM 1 ICD-10 Diagnosis Description 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, stage 4 L89.313 Pressure ulcer of right  buttock, stage 3 Physician Procedures : CPT4 Code Description Modifier [id number] - WC PHYS LEVEL 3 - EST PT 25 ICD-10 Diagnosis Description 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, stage 4 L89.313 Pressure ulcer of right buttock, stage 3 E11.622 Type 2 diabetes mellitus with other skin  ulcer Quantity: 1 : [id number] 11042 - WC PHYS SUBQ TISS 20 SQ CM ICD-10 Diagnosis Description 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, stage 4 L89.313 Pressure ulcer of right buttock, stage 3 Quantity: 1 : [id number] 11045 - WC PHYS SUBQ TISS EA ADDL 20 CM ICD-10 Diagnosis Description 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

## 2021-11-05 ENCOUNTER — Encounter (HOSPITAL_BASED_OUTPATIENT_CLINIC_OR_DEPARTMENT_OTHER): Payer: Medicare Other | Attending: General Surgery | Admitting: General Surgery

## 2021-11-05 DIAGNOSIS — E11622 Type 2 diabetes mellitus with other skin ulcer: Secondary | ICD-10-CM | POA: Insufficient documentation

## 2021-11-05 DIAGNOSIS — L89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, stage 4: Secondary | ICD-10-CM | POA: Insufficient documentation

## 2021-11-05 DIAGNOSIS — G8220 Paraplegia, unspecified: Secondary | ICD-10-CM | POA: Insufficient documentation

## 2021-11-05 DIAGNOSIS — L89313 Pressure ulcer of right buttock, stage 3: Secondary | ICD-10-CM | POA: Insufficient documentation

## 2021-11-05 DIAGNOSIS — I10 Essential (primary) hypertension: Secondary | ICD-10-CM | POA: Insufficient documentation

## 2021-11-05 DIAGNOSIS — F0390 Unspecified dementia without behavioral disturbance: Secondary | ICD-10-CM | POA: Insufficient documentation

## 2021-11-05 DIAGNOSIS — G894 Chronic pain syndrome: Secondary | ICD-10-CM | POA: Insufficient documentation

## 2021-11-05 DIAGNOSIS — L97426 Non-pressure chronic ulcer of left heel and midfoot with bone involvement without evidence of necrosis: Secondary | ICD-10-CM | POA: Diagnosis not present

## 2021-11-05 NOTE — Progress Notes (Signed)
[name], [name] ([id number]) Visit Report for 11/05/2021 Chief Complaint Document Details Patient Name: Date of Service: TEMEKA, PORE 11/05/2021 10:30 A M Medical Record Number: [id number] Patient Account Number: [id number] Date of Birth/Sex: Treating RN: Mar 22, 1951 (71 y.o. [name]: [name] Other Clinician: Referring Provider: Treating Provider/Extender: Nance Pew in Treatment: [phone] Information Obtained from: Patient Chief Complaint She is here for follow up evaluation of left ischial pressure ulcer 12/30/2018; patient comes back for review of wounds in the same general area over the previously healed left ischial pressure ulcer Electronic Signature(s) Signed: 11/05/2021 12:11:28 PM By: Fredirick Maudlin MD FACS Entered By: Fredirick Maudlin on 11/05/2021 12:11:28 -------------------------------------------------------------------------------- Debridement Details Patient Name: Date of Service: [name]. 11/05/2021 10:30 A M Medical Record Number: [id number] Patient Account Number: [id number] Date of Birth/Sex: Treating RN: April 14, 1950 (71 y.o. [name]: [name] Other Clinician: Referring Provider: Treating Provider/Extender: Nance Pew in Treatment: 148 Debridement Performed for Assessment: Wound #15 Right Ischium Performed By: Physician Fredirick Maudlin, MD Debridement Type: Debridement Level of Consciousness (Pre-procedure): Awake and Alert Pre-procedure Verification/Time Out Yes - 11:40 Taken: Start Time: 11:42 Pain Control: Lidocaine 4% T opical Solution T Area Debrided (L x W): otal 2.5 (cm) x 2 (cm) = 5 (cm) Tissue and other material debrided: Viable, Non-Viable, Slough, Subcutaneous, Slough Level: Skin/Subcutaneous Tissue Debridement Description: Excisional Instrument: Curette, Forceps, Scissors Bleeding: Minimum Hemostasis Achieved:  Pressure Procedural Pain: 1 Post Procedural Pain: 1 Response to Treatment: Procedure was tolerated well Level of Consciousness (Post- Awake and Alert procedure): Post Debridement Measurements of Total Wound Length: (cm) 2.5 Stage: Category/Stage III Width: (cm) 2 Depth: (cm) 0.8 Volume: (cm) 3.142 Character of Wound/Ulcer Post Debridement: Requires Further Debridement Post Procedure Diagnosis Same as Pre-procedure Electronic Signature(s) Signed: 11/05/2021 1:14:05 PM By: Fredirick Maudlin MD FACS Signed: 11/05/2021 5:53:45 PM By: Baruch Gouty RN, BSN Entered By: Baruch Gouty on 11/05/2021 11:46:32 -------------------------------------------------------------------------------- Debridement Details Patient Name: Date of Service: [name] Pulse M. 11/05/2021 10:30 A M Medical Record Number: [id number] Patient Account Number: [id number] Date of Birth/Sex: Treating RN: 09-09-1950 (71 y.o. [name]: [name] Other Clinician: Referring Provider: Treating Provider/Extender: Nance Pew in Treatment: 148 Debridement Performed for Assessment: Wound #12 Left Ischial Tuberosity Performed By: Physician Fredirick Maudlin, MD Debridement Type: Debridement Level of Consciousness (Pre-procedure): Awake and Alert Pre-procedure Verification/Time Out Yes - 11:40 Taken: Start Time: 11:42 Pain Control: Lidocaine 4% T opical Solution T Area Debrided (L x W): otal 3 (cm) x 6 (cm) = 18 (cm) Tissue and other material debrided: Viable, Non-Viable, Eschar, Fat, Slough, Subcutaneous, Slough Level: Skin/Subcutaneous Tissue Debridement Description: Excisional Instrument: Blade, Curette, Forceps Bleeding: Minimum Hemostasis Achieved: Pressure Procedural Pain: 1 Post Procedural Pain: 1 Response to Treatment: Procedure was tolerated well Level of Consciousness (Post- Awake and Alert procedure): Post Debridement Measurements of Total  Wound Length: (cm) 3 Stage: Category/Stage IV Width: (cm) 9 Depth: (cm) 1.3 Volume: (cm) 27.567 Character of Wound/Ulcer Post Debridement: Requires Further Debridement Post Procedure Diagnosis Same as Pre-procedure Electronic Signature(s) Signed: 11/05/2021 1:14:05 PM By: Fredirick Maudlin MD FACS Signed: 11/05/2021 5:53:45 PM By: Baruch Gouty RN, BSN Entered By: Baruch Gouty on 11/05/2021 11:52:54 -------------------------------------------------------------------------------- HPI Details Patient Name: Date of Service: [name] Pulse M. 11/05/2021 10:30 A M Medical Record Number: [id number] Patient Account Number: [id number] Date of Birth/Sex: Treating RN: Apr 04, 1951 (71 y.o. F)  Baruch Gouty Primary Care Provider: [name] Other Clinician: Referring Provider: Treating Provider/Extender: Nance Pew in Treatment: 148 History of Present Illness Location: open ulceration of the left gluteal area, left heel and right ankle for about 5 months. Quality: Patient reports No Pain. Severity: Patient states wound(s) are getting worse. Duration: Patient has had the wound for > 5 months prior to seeking treatment at the wound center Context: The wound occurred when the patient has been paraplegic for about 3 years. Modifying Factors: Wound improving due to current treatment. ssociated Signs and Symptoms: Patient reports having foul odor. A HPI Description: this 71 year old patient who is known to have hypertension, hypothyroidism, breast cancer, chronic pain syndrome, paraplegia was noted to have a left gluteal decubitus ulcer and was brought into the hospital. During the course of her hospitalization she was debrided in the operating room by ankle wound. Bone cultures were taken at that time but were negative but clinically she was treated for osteomyelitis because of the probing down to bone and open exposed bone. Home health has been giving her antibioticss  which include vancomycin and Zosyn. The patient was a smoker until about 3 weeks ago and used to smoke about 10 cigarettes a day for a long while. 12/13/2014 - details of her operative note from 11/03/2014 were reviewed -- PROCEDURE: 1. Excisional debridement skin, subcutaneous, muscle left ischium 35 cm2 2. Excisional debridement skin, subcutaneous tissue left heel 27 cm2 3. Excisional debridement right ankle skin, subcutaneous, bone 30 cm2 01/24/2015 -- she has some issues with her wheelchair cushion but other than that is doing very well and has received Podus boots for her feet. 02/14/2015 -- she was using her old offloading boots and this seemed to have caused her a new pressure ulcer on the left posterior heel near the superior part just below the Achilles tendon. 03/07/2015 -- she has a new ulceration just to the left of the midline on her sacral region more on the left buttock and this has been there for Dr. Leland Johns and had all the wounds sharply debrided. The debridement was done for the left ischial wound, the left heel wound and the right about a week. 08/22/2015 -- was recently admitted to hospital between May 5 and 08/13/2015, with sepsis and leukocytosis due to a UTI. she was treated for a sepsis complicating Escherichia coli UTI and kidney stones. She also had metabolic and careful up at the secondary to pyelonephritis. He received broad-spectrum antibiotics initially and then received Macrobid as per urology. She was sent home on nitrofurantoin. during her admission she had a CT scan which showed exposed left ischial tuberosity without evidence of osteolysis. 09/12/2015-- the patient is having some issues with her air mattress and would like to get a opinion from medical modalities. 10/10/2015 -- the issue with her air mattress has not yet been sorted out and the new problem seems to be a lot of odor from the wound VAC. 11/27/2015 -- the patient was admitted to the hospital between  July 23 and 10/31/2015. Her problems were sepsis, osteomyelitis of the pelvic bone and acute pyelonephritis. CT of the abdomen and pelvis was consistent with a left-sided pyelonephritis with hydronephrosis and also just showed new sclerosis of the posterior portion of the left anterior pubic ramus suggestive of periosteal reaction consistent with osteomyelitis. She was treated for the osteomyelitis with infectious disease consult recommending 6 weeks of IV antibiotics including vancomycin and Rocephin and the antibiotics were to go on until  12/10/2015. He was seen by Dr. Iran Planas plastic surgery and Dr. Linus Salmons of infectious disease. She had a suprapubic catheter placed during the admission. CT scan done on 10/28/2015 showed specifically -- New sclerosis of the posterior portion of the left inferior pubic ramus with aggressive periosteal reaction, consistent with osteomyelitis, with adjacent soft tissue gas compatible with previously described decubitus ulcer. 12/12/2015 -- she was recently seen by Dr. Linus Salmons, who noted good improvement and CRP and ESR compared to before and he has stopped her antibiotic as per plans to finish on September 4. The patient was encouraged to continue with wound care and consider hyperbaric oxygen therapy. Today she tells me that she has consented to undergo hyperbaric oxygen therapy and we can start the paperwork. 01/02/2016 -- her PCP had gained about 3 years but she still persists in having problems during hyperbaric oxygen therapy with some discomfort in the ears. 01/09/16; pressure area with underlying osteomyelitis in the left buttock. Wound bed itself has some slight amount of grayish surface slough however I do not think any debridement was necessary. There is no exposed bone soft tissue appears stable. She is using a wound VAC 01/16/16; back for weekly wound review in conjunction with HBO. She has a deep wound over the left initial tuberosity previously treated with 6  weeks of IV antibiotics for osteomyelitis. Wound bed looks reasonably healthy although the base of this is still precariously close to bone. She has been using a wound VAC. 01/23/2016 -- she has completed her course of antibiotics and this week the only new thing is her right great toe nail was avulsed and she has got an open wound over the nailbed. 01/31/16 she has completed her course of antibiotics. Her right great toenail avulsed last week and she's been using silver alginate for this as well. Still using a wound VAC to the substantial stage IV wound over the left ischial tuberosity 03/05/2016 -- the patient has had a opinion from the plastic surgery group at [facility] and details of this are not available yet but the patient's verbal report has been heard by me. Did not sound like there was any optimistic discussion regarding reconstruction and the net result would be to continue with the wound VAC application. I will await the official reports. Addendum: -- she was seen at [facility] surgery [facility] by Dr. Tressa Busman. After a thorough review and from what I understand spending 45 minutes with the patient his assessment has been noted by me in detail and the management options were: 1. Continued pressure offloading and wound care versus operative procedures including wound excision 2. Soft tissue and bone sampling 3. If the wound gets larger wound closure would be done using a variety of plastic surgical techniques including but not limited to skin substitute, possible skin graft, local versus regional flaps, negative pressure dressing application. 4. He discussed with her details of flap surgery and the risks associated 5. He made a comment that since the patient was operated on by Dr. Leland Johns of [facility] plastic surgery unit in [place] the patient may continue to follow-up there for further evaluation for surgical flap closure in the  future. 03/19/2016 -- the patient continues to be rather depressed and frustrated with her lack of rapid progress in healing this wound especially because she thought after hyperbaric oxygen therapy the wound would heal extremely fast. She now understands that was not the implied benefit on wound care which was the recommendation  for hyperbaric oxygen therapy. I have had a lengthy discussion with the patient and her husband regarding her options: 1. Continue with collagen and wound VAC for the primary dressing and offloading and all supportive care. 2. See Dr. Iran Planas for possible placement of Acell or Integra in the OR. 3. get a second opinion from a wound care center and surrounding regions/counties 05/07/2016 -- Note from Dr. Celedonio Miyamoto, who noted that the patient has declined flap surgery. She has discussed application of A cell, and try a few applications to see how the wound progresses. She is also recommended that we could apply products here in the wound center, like Oasis. during her preop workup it was found that her hemoglobin A1c was 11% and she has now been diagnosed as having diabetes mellitus and has been put on appropriate treatment by her PCP 05/28/2016 -- tells me her blood sugars have been doing well and she has an appointment to see her PCP in the next couple of weeks to check her hemoglobin A1c. Other than that she continues to do well. 06/25/2016 -- have not seen her back for the last month but she says her health has been about the same and she has an appointment to check the A1c next week 09/10/16 ---- was seen by Dr. Celedonio Miyamoto -- who applied Acell and saw her back in follow-up. She has recommended silver alginate to the wound every other day and cover with foam. If no significant drainage could transition to collagen every other day. She recommended discontinuing wound VAC. There were no plans to repeat application of Acell. The patient expressed that her husband  could do the wound care as going to the Wound Ctr., would cost several $100 for each visit. 10/21/2016 -- her insurance company is getting her new mattress and she is pleased about that. Other than that she has been doing dressings with PolyMem Silver and has been doing very well 02/18/2017 -- she has gone through several changes of her mattress and has not been pleased with any of them. The ventricles are still working on trying to fit her with the appropriate low air-loss mattress. She has a new wound on the gluteal area which is clearly separated from the original wound. 03/25/17-she is here in follow-up evaluation for her left ischialpressure ulcer. She remains unsatisfied with her pressure mattress. She admits to sitting multiple hours a day, in the bed. We have discussed offloading options. The wound does not appear infected. Nutrition does not appear to be a concern. Will follow-up in 4 weeks, if wound continues to be stalled may consider x-ray to evaluate for refractory osteomyelitis. 04/21/17; this is a patient that I don't know all that well. She has a chronic wound which at one point had underlying osteomyelitis in the left ischial tuberosity. This is a stage IV pressure ulcer. Over the last 3 months she has a stage II wound inferiorly to the original wound. The last time she was here her dressing was changed to silver collagen although the patient's husband who changes the dressing said that the collagen stuck to the wound and remove skin from the superficial area therefore he switched back to [place] 05/13/17; this is a patient we've been following for a left ischial tuberosity wound which was stage IV at one point had underlying osteomyelitis. Over the last several months she's had a stage II wound just inferior and medial to the related to the wound. According to her husband he is using  Endoform layer with collagen although this is not what I had last time. According to her husband they  are using Elgie Congo with collagen although I don't quite know how that started. She was hospitalized from 1/20 through 04/30/16. This was related to a UTI. Her blood cultures were negative, urine culture showed multiple species. She did have a CT scan of the abdomen and pelvis which documented chronic osteomyelitis in the area of the wound inflammatory markers were unremarkable. She has had prior knowledge of osteomyelitis. It looks as though she received IV antibiotics in 2017 and was treated with a course of hyperbaric oxygen. 05/28/17; the wound over the left ischial tuberosity is deeper today and abuts clearly on bone. Nursing intake reported drainage. I therefore culture of the wound. The more superficial area just below this looks about the same. They once again complained that there are mattress cover is not working although apparently [facility] is been noted to see this many times in the report is that the device is functional 06/18/17; the patient had a probing area on the left ischial tuberosity that was draining purulent fluid last time. This also clearly seemed to have open bone. Culture I did showed pansensitive pseudomonas including third generation cephalosporins. I treated this with cefdinir 300 twice a day for 10 days and things seem to have improved. She has a more superficial wound just underneath this area. Amazingly she has a new air mattress through [facility]. I think they gave this to her as a parking give. In any case this now works according to the patient may have something to do with why the areas are looking better. 07/09/17; the patient has a probing area in the left ischial tuberosity that still has some depth. However this is contracted in terms of the wound orifice although the depth is still roughly the same. There is no undermining. She also has the satellite wound which is more superficial. This appears to have a healthy surface we've been using  silver collagen 08/06/17; the patient's wound is over the left ischial tuberosity and a satellite lesion just underneath this. The original wound was actually a deep stage 4 wound. We have made good progress in 2 months and there is no longer exposed bone here. 09/03/17; left ischial tuberosity actually appears to be quite healthy. I think we are making progress. No debridement is required. There is no surrounding erythema 10/01/17 I follow this patient monthly for her left ischial tuberosity wound. There is 2 areas the original area and a satellite area. The satellite area looks a lot better there is no surrounding erythema. Her husband relates that he is having trouble maintaining the dressing. This has to do with the soft tissue around it. He states he puts the collagen in but he cannot make sure that it stays in even with the ABD pads and tape that he is been using 10/29/17; patient arrives with a better looking noon today. Some of the satellite lesions have closed. using Prisma 11/26/17; the patient has a large cone-shaped area with the tip of the Cone deep within her buttock soft tissue. The walls of the Cone are epithelialized however the base is still open. The area at the base of this looks moist we've been using silver collagen. Will change to silver alginate 12/31/2017; the wound appears to have come in fairly nicely. Using silver alginate. There is no surrounding maceration or infection 01/28/18; there is still an open area here over the  left initial tuberosity. Base of this however looks healthy. There is no surrounding infection 02/25/18; the area of its open is over the left ischial tuberosity. The base of this is where the wound is. This is a large inverted cone-shaped area with the wound at the tip. Dimensions of the wound at the tip are improved. There is a area of denuded skin about halfway towards the tip which her husband thinks may have happened today when he was bathing her. 04/20/17;  the area is still open over the left initial tuberosity. 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 This is now completely closed over although it is closed over with a divot and skin over bone at the base of this. Continued aggressive offloading will be necessary. 12/30/2018 READMISSION This is a 71 year old woman with chronic paraplegia. I picked her up for her care from Dr. Con Memos in this clinic after he departed. She had a stage IV pressure wound over the left ischial tuberosity. She was treated twice for her underlying osteomyelitis and this I believe firstly in 2016 and again in 2017. There were some plans at some point for flap closure of this however she was discovered to have uncontrolled diabetes and I do not think this was ever accomplished. She ultimately healed over in this clinic and was discharged in May. She has a large cone-shaped indentation with the tip of this going towards the left ischial tuberosity. It is not an easy area to examine but at that time I thought all of this was epithelialized. Apparently there was a reopening here shortly after she left the clinic last time. She was admitted to hospital at the end of June for Klebsiella bacteremia felt to be secondary to UTI. A CT scan of the pelvis is listed below and there was initially some concern that she had underlying osteomyelitis although I believe she was seen by infectious disease and that was felt to be not the case: I do not see any new cultures or inflammatory markers IMPRESSION: 1. No CT evidence for acute intra-abdominal or pelvic abnormality. Large volume of stool throughout the colon. 2. Enlarged fatty liver with fat sparing near the gallbladder fossa 3. Cortical scarring right kidney. Bilateral intrarenal stones without  hydronephrosis. Thick-walled urinary bladder decompressed by suprapubic catheter 4. Deep left decubitus ulcer with underlying left ischial changes suggesting osteomyelitis. Her husband has been using silver collagen in the wound. She has not been systemically unwell no fever chills eating and drinking well. They rigorously offload this wound only getting up in the wheelchair when she is going to appointments the rest of the time she is in bed. 10/8; wound measures larger and she now has exposed bone. We have been using silver alginate 11/12 still using silver alginate. The patient saw Dr. Megan Salon of infectious disease. She was started on Augmentin 500 mg twice daily. She is due to follow-up with Dr. Megan Salon I believe next week. Lab work Dr. Megan Salon requested showed a sedimentation rate of 28 and CRP of 20 although her CRP 1 year ago was 18.8. Sedimentation rate 1 year ago was 11 basic metabolic panel showed a creatinine of 1.12 12/3; the patient followed up with Dr. Megan Salon yesterday. She is still on Augmentin twice daily. This was directed by Dr. Megan Salon. The patient's  inflammatory markers have improved which is gratifying. Her C-reactive protein was repeated yesterday and follow-up booked with infectious disease in January. In addition I have been getting secure text messages I think from palliative care through [facility] [employer]. I think they were hoping to provide services to the patient in her home. They could not get a hold of the primary physician and so they reached out to me on 2 separate occasions. 12/17; patient last saw Dr. Megan Salon on 12/2. She is finishing up with Augmentin. Her C-reactive protein was 20 on 10/21, 10.1 on 11/19 and 17 on 12/2. The wound itself still has depth and undermining. We are using Santyl with the backing wet-to-dry 04/27/2019. The wound is gradually clearing up in terms of the surface although it is not filled in that much. Still abuts  right against bone 2/4; patient with a deep pressure ulcer over the left ischial tuberosity. I thought she was going to follow-up with infectious disease to follow her inflammatory markers although the patient states that they stated that they did not need to see her unless we felt it was necessary. I will need to check their notes. In any case we ordered moistened silver collagen back with wet-to-dry to fill in the depth of the wound although apparently [facility] sent silver alginate which they have been using since they were here the last time. Is obviously not what we ordered. 2/25. Not much change in this wound it is over the left ischial tuberosity recurrent wound. We have been using silver collagen with backing wet-to-dry. I think the wound is about the same. There is still some tunneling from about 10-12 o'clock over the ischial tuberosity itself 3/11; pressure ulcer over the left ischial tuberosity. Since she was last here the wound VAC was started and apparently going quite well. We are able to get the home health company that accepts [place] healthcare which is in itself sometimes problematic. There is been improvements in the wound the tunneling seems to be better and is contracted nicely 4/8; 1 month follow-up. Since she was last here we have been using silver collagen under a wound VAC. Some minor contraction I think in wound volume. She is cared for diligently by her husband including pressure relief, incontinence management, nutritional support etc. 6/1; this is almost a 71-monthfollow-up. She is been using silver collagen under wound VAC. Circular area over the left ischial tuberosity. She has been using silver collagen under wound VAC 7/8; 1 month follow-up. Silver collagen under the VAC not really a lot of progress. Tissue at the base of the wound which is right against bone and the tissue next that this does not look completely viable. She is not currently on any antibiotics, she had  underlying osteomyelitis I need to look this over 8/16; we are using silver collagen under wound VAC to the left ischial tuberosity wound. Comes in today with absolutely no change in surface area or depth. There is no exposed bone. I did look over her infectious disease notes as I said I would do last time. She last saw Dr. CMegan Salonin December 2020. She completed 6 weeks of Augmentin. This was in response to a bone culture I did showing methicillin susceptible staph aureus and Enterococcus. She was supposed to come back to see Dr. CMegan Salonat some point although they say that that appointment was canceled unless I chose to recommend return. I think there was supposed to be follow-up with inflammatory markers but  I cannot see that that was ever done. She has not been on antibiotics since 9/21; monthly follow-up. We received a call from home health nurse last evening to report green drainage coming out of the wound. Lab work I ordered last time showed a white count of 5.2 a sedimentation rate of 45 and a C-reactive protein of 25 however neither one of the 2 values are substantially different from her previous values in October 2020 or December 2020. Both are slightly higher but only marginally. Otherwise no new complaints from the patient or her husband 10/19; 1 month follow-up. PCR culture I did last time showed medium quantities of Pseudomonas lower quantities Klebsiella and Enterococcus faecalis group B strep and Peptostreptococcus. I gave her Augmentin for 2 weeks. I am not really sure of my choice of this I would not cover Pseudomonas. She is still having green drainage. Wound itself looks satisfactory there is not a lot of depth wound bed looks healthy 11/16; patient has completed the antibiotics still using gentamicin and silver alginate on the wound. There is improvement in the surface area 12/21; in general the area on the buttock looks somewhat better. Surface looks healthy although I do not  know that there is been much improvement in the wound volume. We have been using silver alginate and Hydrofera Blue. Less drainage. In passing the husband showed me an abrasion injury on the left anterior tibia. Covered in necrotic surface. He has noticed this for about a week and has been putting silver collagen on it. He is completely uncertain about how this happened 1/25; monthly follow-up. The area on the left buttock is about the same. This does not go to bone but a fairly deep wound surface of the wound is of questionable viability. The abrasion injury that they showed me last time apparently was closed out by home health because they thought it was healed but certainly is not although it is just about healed. As a result they haven't been applying anything to this area Finally I did discuss with the patient and her husband the idea of an advanced treatment product to try and get a proper base to this wound I was thinking of Puraply however actually the patient points out that her co-pay for coming to visit [place] i.e. the facility, charge would be unaffordable if they have to, on a weekly basis 2/22; pressure ulcer on the left buttock appears deeper to me and abuts on the ischial tuberosity. I thought initially there was exposed bone but there is a rim of tissue over this area. She also has a superficial over the right anterior mid tibia. Been using silver collagen to these areas without much success. I have looked over the patient's past history with regards to the area on the left ischium. She did have underlying osteomyelitis here dating back I think to late 2020. She saw Dr. Megan Salon she received a 6-week course of oral antibiotics in response to a bone culture that I did. This does not appear to be infected but it certainly has not been improving in terms of granulation. I do not believe she has had any recent imaging studies 3/29; 1 month follow-up. Pressure ulcer on the left buttock which  she has been dealing with with for a number of years. She was treated for underlying osteomyelitis at 1.2 or 3 years ago I think with infectious disease help. She also had I think a flap closure by Dr. Leland Johns and that lasted for about a  year and then reopened. I have not been able to get this patient to progress towards healing although truthfully the wound is absolutely no worse. We have been using Hydrofera Blue 4/26; patient presents for 1 week follow-up. She has been using silver collagen to the area every other day. She has home health that comes out once a week to help with dressing changes as well. The patient is interested in trying a skin substitute over this area. She states she is trying to relieve pressure off of it most of the day. 5/24; patient presents for 1 month follow-up. She has been using silver collagen to the area every other day. She has no complaints today. She is interested in the skin substitute. She tries to leave relieve pressure off Her bottom however is not able to most of the day 6/14; using silver collagen to the area over the left ischial tuberosity. Wound does not appear to be doing particularly well. Open to bone 6/28; the patient patient presented last time with a marked deterioration. Depth probing all the way to bone. The bone itself did not look particularly viable. In spite of this the x-ray I did showed longstanding ulcer over the left ischial tuberosity with chronic bone involvement/reaction that was also seen by CT in 2020. Lab work did not show just active infection with a sed rate of 14 and a C-reactive protein of 7.1. Her comprehensive metabolic panel was normal including an albumin of 4.1 white count was 6 7/19; patient was here 3 weeks ago with a marked deterioration in her wound over the left ischial tuberosity. I ordered a CT scan of the area for 1 reason or another this just did not get done. It is now booked for 8 days from now. She was supposed to  come back for bone culture and pathology. That did not happen either. We have been using silver collagen. As usual she is diligently looked after by her husband 8/24; 5-week follow-up. Since the patient was last here the biopsy that I did of her ischial tuberosity came back suggesting osteomyelitis. Culture showed strep. I started her on Augmentin. She was seen by infectious disease Dr. Lucianne Lei dam and the Augmentin was continued and she is still taking it. CT scan did not suggest anything other than chronic osteomyelitis without much change from her previous study. Her C-reactive protein was only 7 and sedimentation rate was 1.1. We are still using silver collagen to the wound. She has home health. Her husband is very diligent in her care for this reason I have never pursued a diverting colostomy. She would not agree to this surgery in any case. Finally we have discussed plastic surgery with him in the past and she is not interested in a myocutaneous flap. 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 Since she was last here she was in the ER last week with abdominal pain. She was found to be impacted however in the course of the review there somebody gave her some IV morphine and apparently she developed hives and blisters. There is still tense blisters on her plantar left fifth and fourth fingers 10/4; the patient has completed her Augmentin and is due to follow-up with Dr. Drucilla Schmidt in early November. Her wound today measures about the same but looks a little healthier in terms of granulation there is no exposed bone. I note that she was admitted to hospital for 2 days from 9/21 through 9/23 for delirium. She had received Versed for  glaucoma surgery ultimately that was felt to be the etiology. Her blood cultures were negative she had 30,000 colonies of Pseudomonas in her urine. She did receive broad-spectrum antibiotic therapy but ultimately her wound on the buttock was not felt to be the cause.  As mentioned she has completed her antibiotics still using silver collagen 11/1; patient has completed her antibiotics for the underlying osteomyelitis. She apparently follows up with Dr. Drucilla Schmidt next week. The wound does not look too bad perhaps slightly narrower in terms of width but the depth is about the same. We have been using Hydrofera Blue. The patient talks to me about a wound VAC and made it sound as though she was recently on 1 although I do not see this. The other option is an advanced treatment product like Oasis but that means weekly trips into the clinic. She has previously said she does not want an attempt at plastic surgery 11/15; the patient saw Dr. Drucilla Schmidt noted that her follow-up inflammatory markers normalized. She has completed her antibiotics. We are currently using Hydrofera Blue. Wound itself has some surface tissue over bone but certainly not a lot. I have looked back over her history. The patient has had recurrent osteomyelitis in this area as she is received prolonged courses of antibiotics. We did use a wound VAC for a prolonged period of time in 2021. We had some improvement especially in undermining areas but overall not a lot of measurable improvement. Once again I have tried to think about using advanced treatment products in this area something that would require them visiting the clinic very frequently and they did not seem to want to do this. Most recently we ran [place] however she would have a $157 per application co-pay 26/2; left ischial tuberosity. I do not see much difference in 3 weeks. There is no exposed bone. We are using Hydrofera Blue. Our intake nurse reports some "greenish" drainage 12/21 left ischial tuberosity. Measuring slightly smaller in surface area. The wound still has some tissue over bone i.e. there is no exposed bone. No overt infection. We did a deep tissue culture last time for PCR. The major bacteria was Pseudomonas although there were  medium titers of Enterococcus faecalis Klebsiella E. coli and Morganella as well as Peptostreptococcus. Keystone antibiotic include streptomycin and vancomycin they got this last weekend and used it twice 04/17/2021; no change in this wound. It does not have undermining however the surface is of it does not look particularly viable. We have been using topical antibiotic directed at a PCR culture as well as silver alginate. It is clear in talking to the patient and her husband that she does not offload this area adequately including spending all night on this with I think a level 2 surface on her bed. I have told her that this is not adequate to heal a wound like this.. 2/15; Oasis with a $035 co-pay per application. They are using silver alginate to the wound offloading as best they can according to her husband 06/19/2021: At her last visit, Dr. Dellia Nims changed her to silver collagen, but apparently they did not receive this until just last week. They have continued using silver alginate in the wound. Today, the wound is quite malodorous with necrotic slough. They ran out of Tome topical about 2 months ago. No new cultures have been taken.. 07/17/2021: At the last visit, the wound was in pretty poor condition with a lot of necrotic slough and substantial malodorous drainage. I did take a  new culture for PCR, but for some reason this was never resulted. Nonetheless, they did receive a renewal of their [employer] apparently it was communicated to them that they should use silver alginate rather than the silver collagen. Regardless, the wound is much cleaner at this visit and there is no odor. 07/26/2021: The sacral/left ischial ulcer is fairly clean, but there was some greenish drainage appreciated on the dressing. They have been using topical Keystone with silver alginate. She has unfortunately developed a new ulcer on her right ischium. It is unstageable with a thick layer of eschar. It is  malodorous. 08/14/2021: I took a new PCR culture at her last visit from the new wound that had opened on her right ischial tuberosity. This returned with a polymicrobial population. A new topical Keystone compound was formulated. They have been using this on her wounds along with silver alginate. I also prescribed a course of oral antibiotic, Augmentin and Levaquin to try and address the multiple species with various resistance these that grew out. She was able to take this for about 10 days, but it has started to make her nauseated and she is actually thrown up on occasion. Since she takes them at the same time, she is not sure which one is causing her issue. She has also been having a lot more drainage from her left ischial wound. Her husband says that he has not been using the zinc oxide as much because it is difficult to wash off when he bathes her. We have been using Santyl to the new wound under silver alginate and silver alginate to the left ischial wound. 09/11/2021: Unfortunately, there has been fairly substantial breakdown of both of her wounds. It sounds like the gel compound that the [employer] was being mixed in just leaked out everywhere and left the patient wet and macerated. There is worsening necrotic tissue and increased depth on the right ischial wound and the left one has extended further. They have not been using the zinc oxide as recommended. They contacted [facility] and were advised to simply apply the powder directly to the wounds. 10/09/2021: The right ischial wound has some necrotic tissue on the lateral aspect and into the base, but no purulent drainage and no surrounding erythema or induration. On the left ischial wound, there is leathery nonviable skin extending laterally from the main wound. No significant odor or drainage from this site, either. 11/05/2021: The right ischial wound has more necrotic fibrinous tissue at the base. The leathery eschar on the left ischial wound is  beginning to lift at one of the edges and may be amenable to debridement today. We have been using topical powdered mupirocin to her wounds with silver alginate. Electronic Signature(s) Signed: 11/05/2021 12:13:05 PM By: Fredirick Maudlin MD FACS Entered By: Fredirick Maudlin on 11/05/2021 12:13:04 -------------------------------------------------------------------------------- Physical Exam Details Patient Name: Date of Service: [name]. 11/05/2021 10:30 A M Medical Record Number: [id number] Patient Account Number: [id number] Date of Birth/Sex: Treating RN: 08-Mar-1951 (71 y.o. [name]: [name] Other Clinician: Referring Provider: Treating Provider/Extender: Burgess Amor Weeks in Treatment: 148 Constitutional Slightly hypertensive. . . . No acute distress.Marland Kitchen Respiratory Normal work of breathing on room air.. Notes 11/05/2021: The right ischial wound has more necrotic fibrinous tissue at the base. The leathery eschar on the left ischial wound is beginning to lift at one of the edges and may be amenable to debridement today. Electronic Signature(s) Signed: 11/05/2021 12:13:39 PM By:  Fredirick Maudlin MD FACS Entered By: Fredirick Maudlin on 11/05/2021 12:13:39 -------------------------------------------------------------------------------- Physician Orders Details Patient Name: Date of Service: SAINA, WAAGE 11/05/2021 10:30 A M Medical Record Number: [id number] Patient Account Number: [id number] Date of Birth/Sex: Treating RN: [birth year] (71 y.o. [name]: [name] Other Clinician: Referring Provider: Treating Provider/Extender: Nance Pew in Treatment: [phone] Verbal / Phone Orders: No Diagnosis Coding ICD-10 Coding Code Description 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, stage 4 E11.622 Type 2 diabetes mellitus with other skin ulcer L89.313 Pressure ulcer of right buttock,  stage 3 Follow-up Appointments Return appointment in 1 month. - Dr. Celine Ahr - Room 1 Tuesday 8/29 @ 10:30 am Bathing/ Shower/ Hygiene May shower and wash wound with soap and water. - with dressing changes Edema Control - Lymphedema / SCD / Other Elevate legs to the level of the heart or above for 30 minutes daily and/or when sitting, a frequency of: - throughout the day Off-Loading Turn and reposition every 2 hours - *** Try to shift from side to side and shift to belly (if tolerated),****avoid sitting up in bed except for meals Additional Orders / Instructions Follow Nutritious Diet - protein shakes 2-3 times per day, Juven 2 times per day Home Health No change in wound care orders this week; continue Home Health for wound care. May utilize formulary equivalent dressing for wound treatment orders unless otherwise specified. New wound care orders this week; continue Home Health for wound care. May utilize formulary equivalent dressing for wound treatment orders unless otherwise specified. - use santyl in base of both wound beds, discontinue antibiotic powder Dressing changes to be completed by [place] on Monday / Wednesday / Friday except when patient has scheduled visit at [place]. - increase visits to 3 times per week (spouse having difficulty with dressing changes) Other Home Health Orders/Instructions: - [facility] Wound Treatment Wound #12 - Ischial Tuberosity Wound Laterality: Left Cleanser: Wound Cleanser (Generic) 1 x Per Day/30 Days Discharge Instructions: Cleanse the wound with wound cleanser prior to applying a clean dressing using gauze sponges, not tissue or cotton balls. Cleanser: Soap and Water 1 x Per Day/30 Days Discharge Instructions: May shower and wash wound with dial antibacterial soap and water prior to dressing change. Cleanser: Wound Cleanser (DME) (Generic) 1 x Per Day/30 Days Discharge Instructions: Cleanse the wound with wound cleanser prior to  applying a clean dressing using gauze sponges, not tissue or cotton balls. Peri-Wound Care: Skin Prep (DME) (Generic) 1 x Per Day/30 Days Discharge Instructions: Use skin prep as directed Peri-Wound Care: Zinc Oxide Ointment 30g tube 1 x Per Day/30 Days Discharge Instructions: Apply Zinc Oxide to periwound with each dressing change Prim Dressing: KerraCel Ag Gelling Fiber Dressing, 4x5 in (silver alginate) (DME) (Generic) 1 x Per Day/30 Days ary Discharge Instructions: Apply silver alginate to wound bed as instructed Prim Dressing: Santyl Ointment 1 x Per Day/30 Days ary Discharge Instructions: Apply nickel thick amount to wound bed and then pack with alginate Secondary Dressing: Woven Gauze Sponge, Non-Sterile 4x4 in (DME) (Generic) 1 x Per Day/30 Days Discharge Instructions: Apply over primary dressing as directed. Secondary Dressing: Zetuvit Plus Silicone Border Dressing 7x7(in/in) (DME) (Generic) 1 x Per Day/30 Days Discharge Instructions: Apply silicone border over primary dressing as directed. Wound #15 - Ischium Wound Laterality: Right Cleanser: Soap and Water 1 x Per Day/30 Days Discharge Instructions: May shower and wash wound with dial antibacterial soap and water prior to  dressing change. Cleanser: Wound Cleanser (DME) (Generic) 1 x Per Day/30 Days Discharge Instructions: Cleanse the wound with wound cleanser prior to applying a clean dressing using gauze sponges, not tissue or cotton balls. Peri-Wound Care: Skin Prep (DME) (Generic) 1 x Per Day/30 Days Discharge Instructions: Use skin prep as directed Peri-Wound Care: Zinc Oxide Ointment 30g tube 1 x Per Day/30 Days Discharge Instructions: Apply Zinc Oxide to periwound with each dressing change Prim Dressing: KerraCel Ag Gelling Fiber Dressing, 4x5 in (silver alginate) (Generic) 1 x Per Day/30 Days ary Discharge Instructions: Apply silver alginate to wound bed as instructed Prim Dressing: Santyl Ointment 1 x Per Day/30  Days ary Discharge Instructions: Apply nickel thick amount to wound bed then pack with silver alginate Secondary Dressing: Woven Gauze Sponge, Non-Sterile 4x4 in (DME) (Generic) 1 x Per Day/30 Days Discharge Instructions: Apply over primary dressing as directed. Secondary Dressing: Zetuvit Plus Silicone Border Dressing 5x5 (in/in) (DME) (Generic) 1 x Per Day/30 Days Discharge Instructions: Apply silicone border or ABD padover primary dressing as directed. Electronic Signature(s) Signed: 11/05/2021 1:14:05 PM By: Fredirick Maudlin MD FACS Entered By: Fredirick Maudlin on 11/05/2021 12:14:35 -------------------------------------------------------------------------------- Problem List Details Patient Name: Date of Service: [name]. 11/05/2021 10:30 A M Medical Record Number: [id number] Patient Account Number: [id number] Date of Birth/Sex: Treating RN: 10-07-1950 (71 y.o. [name]: [name] Other Clinician: Referring Provider: Treating Provider/Extender: Burgess Amor Weeks in Treatment: 148 Active Problems ICD-10 Encounter Code Description Active Date MDM Diagnosis 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, stage 4 12/30/2018 No Yes E11.622 Type 2 diabetes mellitus with other skin ulcer 12/30/2018 No Yes L89.313 Pressure ulcer of right buttock, stage 3 07/26/2021 No Yes Inactive Problems ICD-10 Code Description Active Date Inactive Date G82.20 Paraplegia, unspecified 12/30/2018 12/30/2018 M86.68 Other chronic osteomyelitis, other site 02/17/2019 02/17/2019 S80.811D Abrasion, right lower leg, subsequent encounter 03/27/2020 03/27/2020 M86.68 Other chronic osteomyelitis, other site 11/28/2020 11/28/2020 L97.811 Non-pressure chronic ulcer of other part of right lower leg limited to breakdown of skin 03/27/2020 03/27/2020 Resolved Problems Electronic Signature(s) Signed: 11/05/2021 12:10:57 PM By: Fredirick Maudlin MD FACS Previous Signature:  11/05/2021 11:36:06 AM Version By: Fredirick Maudlin MD FACS Entered By: Fredirick Maudlin on 11/05/2021 12:10:57 -------------------------------------------------------------------------------- Progress Note Details Patient Name: Date of Service: [name]. 11/05/2021 10:30 A M Medical Record Number: [id number] Patient Account Number: [id number] Date of Birth/Sex: Treating RN: [birth year] (71 y.o. Martyn [name], Vaughan Basta Primary Care Provider: [name] Other Clinician: Referring Provider: Treating Provider/Extender: Nance Pew in Treatment: 148 Subjective Chief Complaint Information obtained from Patient She is here for follow up evaluation of left ischial pressure ulcer 12/30/2018; patient comes back for review of wounds in the same general area over the previously healed left ischial pressure ulcer History of Present Illness (HPI) The following HPI elements were documented for the patient's wound: Location: open ulceration of the left gluteal area, left heel and right ankle for about 5 months. Quality: Patient reports No Pain. Severity: Patient states wound(s) are getting worse. Duration: Patient has had the wound for > 5 months prior to seeking treatment at the wound center Context: The wound occurred when the patient has been paraplegic for about 3 years. Modifying Factors: Wound improving due to current treatment. Associated Signs and Symptoms: Patient reports having foul odor. this 71 year old patient who is known to have hypertension, hypothyroidism, breast cancer, chronic pain syndrome, paraplegia was noted to have a left gluteal decubitus ulcer  and was brought into the hospital. During the course of her hospitalization she was debrided in the operating room by ankle wound. Bone cultures were taken at that time but were negative but clinically she was treated for osteomyelitis because of the probing down to bone and open exposed bone. Home health has been  giving her antibioticss which include vancomycin and Zosyn. The patient was a smoker until about 3 weeks ago and used to smoke about 10 cigarettes a day for a long while. 12/13/2014 - details of her operative note from 11/03/2014 were reviewed -- PROCEDURE: 1. Excisional debridement skin, subcutaneous, muscle left ischium 35 cm2 2. Excisional debridement skin, subcutaneous tissue left heel 27 cm2 3. Excisional debridement right ankle skin, subcutaneous, bone 30 cm2 01/24/2015 -- she has some issues with her wheelchair cushion but other than that is doing very well and has received Podus boots for her feet. 02/14/2015 -- she was using her old offloading boots and this seemed to have caused her a new pressure ulcer on the left posterior heel near the superior part just below the Achilles tendon. 03/07/2015 -- she has a new ulceration just to the left of the midline on her sacral region more on the left buttock and this has been there for Dr. Leland Johns and had all the wounds sharply debrided. The debridement was done for the left ischial wound, the left heel wound and the right about a week. 08/22/2015 -- was recently admitted to hospital between May 5 and 08/13/2015, with sepsis and leukocytosis due to a UTI. she was treated for a sepsis complicating Escherichia coli UTI and kidney stones. She also had metabolic and careful up at the secondary to pyelonephritis. He received broad-spectrum antibiotics initially and then received Macrobid as per urology. She was sent home on nitrofurantoin. during her admission she had a CT scan which showed exposed left ischial tuberosity without evidence of osteolysis. 09/12/2015-- the patient is having some issues with her air mattress and would like to get a opinion from medical modalities. 10/10/2015 -- the issue with her air mattress has not yet been sorted out and the new problem seems to be a lot of odor from the wound VAC. 11/27/2015 -- the patient was admitted to  the hospital between July 23 and 10/31/2015. Her problems were sepsis, osteomyelitis of the pelvic bone and acute pyelonephritis. CT of the abdomen and pelvis was consistent with a left-sided pyelonephritis with hydronephrosis and also just showed new sclerosis of the posterior portion of the left anterior pubic ramus suggestive of periosteal reaction consistent with osteomyelitis. She was treated for the osteomyelitis with infectious disease consult recommending 6 weeks of IV antibiotics including vancomycin and Rocephin and the antibiotics were to go on until 12/10/2015. He was seen by Dr. Iran Planas plastic surgery and Dr. Linus Salmons of infectious disease. She had a suprapubic catheter placed during the admission. CT scan done on 10/28/2015 showed specifically -- New sclerosis of the posterior portion of the left inferior pubic ramus with aggressive periosteal reaction, consistent with osteomyelitis, with adjacent soft tissue gas compatible with previously described decubitus ulcer. 12/12/2015 -- she was recently seen by Dr. Linus Salmons, who noted good improvement and CRP and ESR compared to before and he has stopped her antibiotic as per plans to finish on September 4. The patient was encouraged to continue with wound care and consider hyperbaric oxygen therapy. Today she tells me that she has consented to undergo hyperbaric oxygen therapy and we can start the paperwork. 01/02/2016 --  her PCP had gained about 3 years but she still persists in having problems during hyperbaric oxygen therapy with some discomfort in the ears. 01/09/16; pressure area with underlying osteomyelitis in the left buttock. Wound bed itself has some slight amount of grayish surface slough however I do not think any debridement was necessary. There is no exposed bone soft tissue appears stable. She is using a wound VAC 01/16/16; back for weekly wound review in conjunction with HBO. She has a deep wound over the left initial tuberosity  previously treated with 6 weeks of IV antibiotics for osteomyelitis. Wound bed looks reasonably healthy although the base of this is still precariously close to bone. She has been using a wound VAC. 01/23/2016 -- she has completed her course of antibiotics and this week the only new thing is her right great toe nail was avulsed and she has got an open wound over the nailbed. 01/31/16 she has completed her course of antibiotics. Her right great toenail avulsed last week and she's been using silver alginate for this as well. 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 Did not sound like there was any optimistic discussion regarding reconstruction and the net result would be to continue with the wound VAC application. I will await the official reports. Addendum: -- she was seen at [facility] by Dr. Tressa Busman. After a thorough review and from what I understand spending 45 minutes with the patient his assessment has been noted by me in detail and the management options were: 1. Continued pressure offloading and wound care versus operative procedures including wound excision 2. Soft tissue and bone sampling 3. If the wound gets larger wound closure would be done using a variety of plastic surgical techniques including but not limited to skin substitute, possible skin graft, local versus regional flaps, negative pressure dressing application. 4. He discussed with her details of flap surgery and the risks associated 5. He made a comment that since the patient was operated on by Dr. Leland Johns of [facility] plastic surgery unit in [place] the patient may continue to follow-up there for further evaluation for surgical flap  closure in the future. 03/19/2016 -- the patient continues to be rather depressed and frustrated with her lack of rapid progress in healing this wound especially because she thought after hyperbaric oxygen therapy the wound would heal extremely fast. She now understands that was not the implied benefit on wound care which was the recommendation for hyperbaric oxygen therapy. I have had a lengthy discussion with the patient and her husband regarding her options: 1. Continue with collagen and wound VAC for the primary dressing and offloading and all supportive care. 2. See Dr. Iran Planas for possible placement of Acell or Integra in the OR. 3. get a second opinion from a wound care center and surrounding regions/counties 05/07/2016 -- Note from Dr. Celedonio Miyamoto, who noted that the patient has declined flap surgery. She has discussed application of A cell, and try a few applications to see how the wound progresses. She is also recommended that we could apply products here in the wound center, like Oasis. during her preop workup it was found that her hemoglobin A1c was 11% and she has now been  diagnosed as having diabetes mellitus and has been put on appropriate treatment by her PCP 05/28/2016 -- tells me her blood sugars have been doing well and she has an appointment to see her PCP in the next couple of weeks to check her hemoglobin A1c. Other than that she continues to do well. 06/25/2016 -- have not seen her back for the last month but she says her health has been about the same and she has an appointment to check the A1c next week 09/10/16 ---- was seen by Dr. Celedonio Miyamoto -- who applied Acell and saw her back in follow-up. She has recommended silver alginate to the wound every other day and cover with foam. If no significant drainage could transition to collagen every other day. She recommended discontinuing wound VAC. There were no plans to repeat application of Acell. The patient expressed  that her husband could do the wound care as going to the Wound Ctr[facility] would cost several $100 for each visit. 10/21/2016 -- her insurance company is getting her new mattress and she is pleased about that. Other than that she has been doing dressings with PolyMem Silver and has been doing very well 02/18/2017 -- she has gone through several changes of her mattress and has not been pleased with any of them. The ventricles are still working on trying to fit her with the appropriate low air-loss mattress. She has a new wound on the gluteal area which is clearly separated from the original wound. 03/25/17-she is here in follow-up evaluation for her left ischialpressure ulcer. She remains unsatisfied with her pressure mattress. She admits to sitting multiple hours a day, in the bed. We have discussed offloading options. The wound does not appear infected. Nutrition does not appear to be a concern. Will follow-up in 4 weeks, if wound continues to be stalled may consider x-ray to evaluate for refractory osteomyelitis. 04/21/17; this is a patient that I don't know all that well. She has a chronic wound which at one point had underlying osteomyelitis in the left ischial tuberosity. This is a stage IV pressure ulcer. Over the last 3 months she has a stage II wound inferiorly to the original wound. The last time she was here her dressing was changed to silver collagen although the patient's husband who changes the dressing said that the collagen stuck to the wound and remove skin from the superficial area therefore he switched back to Froid 05/13/17; this is a patient we've been following for a left ischial tuberosity wound which was stage IV at one point had underlying osteomyelitis. Over the last several months she's had a stage II wound just inferior and medial to the related to the wound. According to her husband he is using Endoform layer with collagen although this is not what I had last time. According to  her husband they are using Elgie Congo with collagen although I don't quite know how that started. She was hospitalized from 1/20 through 04/30/16. This was related to a UTI. Her blood cultures were negative, urine culture showed multiple species. She did have a CT scan of the abdomen and pelvis which documented chronic osteomyelitis in the area of the wound inflammatory markers were unremarkable. She has had prior knowledge of osteomyelitis. It looks as though she received IV antibiotics in 2017 and was treated with a course of hyperbaric oxygen. 05/28/17; the wound over the left ischial tuberosity is deeper today and abuts clearly on bone. Nursing intake reported drainage. I therefore culture of  the wound. The more superficial area just below this looks about the same. They once again complained that there are mattress cover is not working although apparently [facility] is been noted to see this many times in the report is that the device is functional 06/18/17; the patient had a probing area on the left ischial tuberosity that was draining purulent fluid last time. This also clearly seemed to have open bone. Culture I did showed pansensitive pseudomonas including third generation cephalosporins. I treated this with cefdinir 300 twice a day for 10 days and things seem to have improved. She has a more superficial wound just underneath this area. Amazingly she has a new air mattress through [facility]. I think they gave this to her as a parking give. In any case this now works according to the patient may have something to do with why the areas are looking better. 07/09/17; the patient has a probing area in the left ischial tuberosity that still has some depth. However this is contracted in terms of the wound orifice although the depth is still roughly the same. There is no undermining. ooShe also has the satellite wound which is more superficial. This appears to have a healthy surface  we've been using silver collagen 08/06/17; the patient's wound is over the left ischial tuberosity and a satellite lesion just underneath this. The original wound was actually a deep stage 4 wound. We have made good progress in 2 months and there is no longer exposed bone here. 09/03/17; left ischial tuberosity actually appears to be quite healthy. I think we are making progress. No debridement is required. There is no surrounding erythema 10/01/17 I follow this patient monthly for her left ischial tuberosity wound. There is 2 areas the original area and a satellite area. The satellite area looks a lot better there is no surrounding erythema. Her husband relates that he is having trouble maintaining the dressing. This has to do with the soft tissue around it. He states he puts the collagen in but he cannot make sure that it stays in even with the ABD pads and tape that he is been using 10/29/17; patient arrives with a better looking noon today. Some of the satellite lesions have closed. using Prisma 11/26/17; the patient has a large cone-shaped area with the tip of the Cone deep within her buttock soft tissue. The walls of the Cone are epithelialized however the base is still open. The area at the base of this looks moist we've been using silver collagen. Will change to silver alginate 12/31/2017; the wound appears to have come in fairly nicely. Using silver alginate. There is no surrounding maceration or infection 01/28/18; there is still an open area here over the left initial tuberosity. Base of this however looks healthy. There is no surrounding infection 02/25/18; the area of its open is over the left ischial tuberosity. The base of this is where the wound is. This is a large inverted cone-shaped area with the wound at the tip. Dimensions of the wound at the tip are improved. There is a area of denuded skin about halfway towards the tip which her husband thinks may have happened today when he was  bathing her. 04/20/17; the area is still open over the left initial tuberosity. This is an cone shaped wound with the tip where the wound remains area there is no evidence of infection, no erythema and no purulent drainage 5/12; very fragile patient who had a chronic stage IV wound  over the left ischial tuberosity. This is now completely closed over although it is closed over with a divot and skin over bone at the base of this. Continued aggressive offloading will be necessary. 12/30/2018 READMISSION This is a 71 year old woman with chronic paraplegia. I picked her up for her care from Dr. Con Memos in this clinic after he departed. She had a stage IV pressure wound over the left ischial tuberosity. She was treated twice for her underlying osteomyelitis and this I believe firstly in 2016 and again in 2017. There were some plans at some point for flap closure of this however she was discovered to have uncontrolled diabetes and I do not think this was ever accomplished. She ultimately healed over in this clinic and was discharged in May. She has a large cone-shaped indentation with the tip of this going towards the left ischial tuberosity. It is not an easy area to examine but at that time I thought all of this was epithelialized. Apparently there was a reopening here shortly after she left the clinic last time. She was admitted to hospital at the end of June for Klebsiella bacteremia felt to be secondary to UTI. A CT scan of the pelvis is listed below and there was initially some concern that she had underlying osteomyelitis although I believe she was seen by infectious disease and that was felt to be not the case: I do not see any new cultures or inflammatory markers IMPRESSION: 1. No CT evidence for acute intra-abdominal or pelvic abnormality. Large volume of stool throughout the colon. 2. Enlarged fatty liver with fat sparing near the gallbladder fossa 3. Cortical scarring right kidney. Bilateral  intrarenal stones without hydronephrosis. Thick-walled urinary bladder decompressed by suprapubic catheter 4. Deep left decubitus ulcer with underlying left ischial changes suggesting osteomyelitis. Her husband has been using silver collagen in the wound. She has not been systemically unwell no fever chills eating and drinking well. They rigorously offload this wound only getting up in the wheelchair when she is going to appointments the rest of the time she is in bed. 10/8; wound measures larger and she now has exposed bone. We have been using silver alginate 11/12 still using silver alginate. The patient saw Dr. Megan Salon of infectious disease. She was started on Augmentin 500 mg twice daily. She is due to follow-up with Dr. Megan Salon I believe next week. Lab work Dr. Megan Salon requested showed a sedimentation rate of 28 and CRP of 20 although her CRP 1 year ago was 18.8. Sedimentation rate 1 year ago was 11 basic metabolic panel showed a creatinine of 1.12 12/3; the patient followed up with Dr. Megan Salon yesterday. She is still on Augmentin twice daily. This was directed by Dr. Megan Salon. The patient's inflammatory markers have improved which is gratifying. Her C-reactive protein was repeated yesterday and follow-up booked with infectious disease in January. In addition I have been getting secure text messages I think from palliative care through [facility] [employer]. I think they were hoping to provide services to the patient in her home. They could not get a hold of the primary physician and so they reached out to me on 2 separate occasions. 12/17; patient last saw Dr. Megan Salon on 12/2. She is finishing up with Augmentin. Her C-reactive protein was 20 on 10/21, 10.1 on 11/19 and 17 on 12/2. The wound itself still has depth and undermining. We are using Santyl with the backing wet-to-dry 04/27/2019. The wound is gradually clearing up in terms of  the surface although it is not filled  in that much. Still abuts right against bone 2/4; patient with a deep pressure ulcer over the left ischial tuberosity. I thought she was going to follow-up with infectious disease to follow her inflammatory markers although the patient states that they stated that they did not need to see her unless we felt it was necessary. I will need to check their notes. In any case we ordered moistened silver collagen back with wet-to-dry to fill in the depth of the wound although apparently [facility] sent silver alginate which they have been using since they were here the last time. Is obviously not what we ordered. 2/25. Not much change in this wound it is over the left ischial tuberosity recurrent wound. We have been using silver collagen with backing wet-to-dry. I think the wound is about the same. There is still some tunneling from about 10-12 o'clock over the ischial tuberosity itself 3/11; pressure ulcer over the left ischial tuberosity. Since she was last here the wound VAC was started and apparently going quite well. We are able to get the home health company that accepts [place] healthcare which is in itself sometimes problematic. There is been improvements in the wound the tunneling seems to be better and is contracted nicely 4/8; 1 month follow-up. Since she was last here we have been using silver collagen under a wound VAC. Some minor contraction I think in wound volume. She is cared for diligently by her husband including pressure relief, incontinence management, nutritional support etc. 6/1; this is almost a 70-monthfollow-up. She is been using silver collagen under wound VAC. Circular area over the left ischial tuberosity. She has been using silver collagen under wound VAC 7/8; 1 month follow-up. Silver collagen under the VAC not really a lot of progress. Tissue at the base of the wound which is right against bone and the tissue next that this does not look completely viable. She is not currently on  any antibiotics, she had underlying osteomyelitis I need to look this over 8/16; we are using silver collagen under wound VAC to the left ischial tuberosity wound. Comes in today with absolutely no change in surface area or depth. There is no exposed bone. I did look over her infectious disease notes as I said I would do last time. She last saw Dr. CMegan Salonin December 2020. She completed 6 weeks of Augmentin. This was in response to a bone culture I did showing methicillin susceptible staph aureus and Enterococcus. She was supposed to come back to see Dr. CMegan Salonat some point although they say that that appointment was canceled unless I chose to recommend return. I think there was supposed to be follow-up with inflammatory markers but I cannot see that that was ever done. She has not been on antibiotics since 9/21; monthly follow-up. We received a call from home health nurse last evening to report green drainage coming out of the wound. Lab work I ordered last time showed a white count of 5.2 a sedimentation rate of 45 and a C-reactive protein of 25 however neither one of the 2 values are substantially different from her previous values in October 2020 or December 2020. Both are slightly higher but only marginally. Otherwise no new complaints from the patient or her husband 10/19; 1 month follow-up. PCR culture I did last time showed medium quantities of Pseudomonas lower quantities Klebsiella and Enterococcus faecalis group B strep and Peptostreptococcus. I gave her Augmentin for 2 weeks. I  am not really sure of my choice of this I would not cover Pseudomonas. She is still having green drainage. Wound itself looks satisfactory there is not a lot of depth wound bed looks healthy 11/16; patient has completed the antibiotics still using gentamicin and silver alginate on the wound. There is improvement in the surface area 12/21; in general the area on the buttock looks somewhat better. Surface looks  healthy although I do not know that there is been much improvement in the wound volume. We have been using silver alginate and Hydrofera Blue. Less drainage. In passing the husband showed me an abrasion injury on the left anterior tibia. Covered in necrotic surface. He has noticed this for about a week and has been putting silver collagen on it. He is completely uncertain about how this happened 1/25; monthly follow-up. The area on the left buttock is about the same. This does not go to bone but a fairly deep wound surface of the wound is of questionable viability. The abrasion injury that they showed me last time apparently was closed out by home health because they thought it was healed but certainly is not although it is just about healed. As a result they haven't been applying anything to this area Finally I did discuss with the patient and her husband the idea of an advanced treatment product to try and get a proper base to this wound I was thinking of Puraply however actually the patient points out that her co-pay for coming to visit [place] i.e. the facility, charge would be unaffordable if they have to, on a weekly basis 2/22; pressure ulcer on the left buttock appears deeper to me and abuts on the ischial tuberosity. I thought initially there was exposed bone but there is a rim of tissue over this area. She also has a superficial over the right anterior mid tibia. Been using silver collagen to these areas without much success. I have looked over the patient's past history with regards to the area on the left ischium. She did have underlying osteomyelitis here dating back I think to late 2020. She saw Dr. Megan Salon she received a 6-week course of oral antibiotics in response to a bone culture that I did. This does not appear to be infected but it certainly has not been improving in terms of granulation. I do not believe she has had any recent imaging studies 3/29; 1 month follow-up. Pressure ulcer  on the left buttock which she has been dealing with with for a number of years. She was treated for underlying osteomyelitis at 1.2 or 3 years ago I think with infectious disease help. She also had I think a flap closure by Dr. Leland Johns and that lasted for about a year and then reopened. I have not been able to get this patient to progress towards healing although truthfully the wound is absolutely no worse. We have been using Hydrofera Blue 4/26; patient presents for 1 week follow-up. She has been using silver collagen to the area every other day. She has home health that comes out once a week to help with dressing changes as well. The patient is interested in trying a skin substitute over this area. She states she is trying to relieve pressure off of it most of the day. 5/24; patient presents for 1 month follow-up. She has been using silver collagen to the area every other day. She has no complaints today. She is interested in the skin substitute. She tries to leave relieve pressure  off Her bottom however is not able to most of the day 6/14; using silver collagen to the area over the left ischial tuberosity. Wound does not appear to be doing particularly well. Open to bone 6/28; the patient patient presented last time with a marked deterioration. Depth probing all the way to bone. The bone itself did not look particularly viable. In spite of this the x-ray I did showed longstanding ulcer over the left ischial tuberosity with chronic bone involvement/reaction that was also seen by CT in 2020. Lab work did not show just active infection with a sed rate of 14 and a C-reactive protein of 7.1. Her comprehensive metabolic panel was normal including an albumin of 4.1 white count was 6 7/19; patient was here 3 weeks ago with a marked deterioration in her wound over the left ischial tuberosity. I ordered a CT scan of the area for 1 reason or another this just did not get done. It is now booked for 8 days  from now. She was supposed to come back for bone culture and pathology. That did not happen either. We have been using silver collagen. As usual she is diligently looked after by her husband 8/24; 5-week follow-up. Since the patient was last here the biopsy that I did of her ischial tuberosity came back suggesting osteomyelitis. Culture showed strep. I started her on Augmentin. She was seen by infectious disease Dr. Lucianne Lei dam and the Augmentin was continued and she is still taking it. CT scan did not suggest anything other than chronic osteomyelitis without much change from her previous study. Her C-reactive protein was only 7 and sedimentation rate was 1.1. We are still using silver collagen to the wound. She has home health. Her husband is very diligent in her care for this reason I have never pursued a diverting colostomy. She would not agree to this surgery in any case. Finally we have discussed plastic surgery with him in the past and she is not interested in a myocutaneous flap. 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 Since she was last here she was in the ER last week with abdominal pain. She was found to be impacted however in the course of the review there somebody gave her some IV morphine and apparently she developed hives and blisters. There is still tense blisters on her plantar left fifth and fourth fingers 10/4; the patient has completed her Augmentin and is due to follow-up with Dr. Drucilla Schmidt in early November. Her wound today measures about the same but looks a little healthier in terms of granulation there is no exposed bone. I note that she was admitted to hospital for 2 days from 9/21 through 9/23 for delirium. She had received Versed for glaucoma surgery ultimately that was felt to be the etiology. Her blood cultures were negative she had 30,000 colonies of Pseudomonas in her urine. She did receive broad-spectrum antibiotic therapy but ultimately her wound on the buttock was not felt  to be the cause. As mentioned she has completed her antibiotics still using silver collagen 11/1; patient has completed her antibiotics for the underlying osteomyelitis. She apparently follows up with Dr. Drucilla Schmidt next week. The wound does not look too bad perhaps slightly narrower in terms of width but the depth is about the same. We have been using Hydrofera Blue. The patient talks to me about a wound VAC and made it sound as though she was recently on 1 although I do not see this. The other option  is an advanced treatment product like Oasis but that means weekly trips into the clinic. She has previously said she does not want an attempt at plastic surgery 11/15; the patient saw Dr. Drucilla Schmidt noted that her follow-up inflammatory markers normalized. She has completed her antibiotics. We are currently using Hydrofera Blue. Wound itself has some surface tissue over bone but certainly not a lot. I have looked back over her history. The patient has had recurrent osteomyelitis in this area as she is received prolonged courses of antibiotics. We did use a wound VAC for a prolonged period of time in 2021. We had some improvement especially in undermining areas but overall not a lot of measurable improvement. Once again I have tried to think about using advanced treatment products in this area something that would require them visiting the clinic very frequently and they did not seem to want to do this. Most recently we ran Morgan however she would have a $253 per application co-pay 66/4; left ischial tuberosity. I do not see much difference in 3 weeks. There is no exposed bone. We are using Hydrofera Blue. Our intake nurse reports some "greenish" drainage 12/21 left ischial tuberosity. Measuring slightly smaller in surface area. The wound still has some tissue over bone i.e. there is no exposed bone. No overt infection. We did a deep tissue culture last time for PCR. The major bacteria was Pseudomonas although  there were medium titers of Enterococcus faecalis Klebsiella E. coli and Morganella as well as Peptostreptococcus. Keystone antibiotic include streptomycin and vancomycin they got this last weekend and used it twice 04/17/2021; no change in this wound. It does not have undermining however the surface is of it does not look particularly viable. We have been using topical antibiotic directed at a PCR culture as well as silver alginate. It is clear in talking to the patient and her husband that she does not offload this area adequately including spending all night on this with I think a level 2 surface on her bed. I have told her that this is not adequate to heal a wound like this.. 2/15; Oasis with a $403 co-pay per application. They are using silver alginate to the wound offloading as best they can according to her husband 06/19/2021: At her last visit, Dr. Dellia Nims changed her to silver collagen, but apparently they did not receive this until just last week. They have continued using silver alginate in the wound. Today, the wound is quite malodorous with necrotic slough. They ran out of Bee topical about 2 months ago. No new cultures have been taken.. 07/17/2021: At the last visit, the wound was in pretty poor condition with a lot of necrotic slough and substantial malodorous drainage. I did take a new culture for PCR, but for some reason this was never resulted. Nonetheless, they did receive a renewal of their [employer] apparently it was communicated to them that they should use silver alginate rather than the silver collagen. Regardless, the wound is much cleaner at this visit and there is no odor. 07/26/2021: The sacral/left ischial ulcer is fairly clean, but there was some greenish drainage appreciated on the dressing. They have been using topical Keystone with silver alginate. She has unfortunately developed a new ulcer on her right ischium. It is unstageable with a thick layer of eschar. It is  malodorous. 08/14/2021: I took a new PCR culture at her last visit from the new wound that had opened on her right ischial tuberosity. This returned with a polymicrobial  population. A new topical Keystone compound was formulated. They have been using this on her wounds along with silver alginate. I also prescribed a course of oral antibiotic, Augmentin and Levaquin to try and address the multiple species with various resistance these that grew out. She was able to take this for about 10 days, but it has started to make her nauseated and she is actually thrown up on occasion. Since she takes them at the same time, she is not sure which one is causing her issue. She has also been having a lot more drainage from her left ischial wound. Her husband says that he has not been using the zinc oxide as much because it is difficult to wash off when he bathes her. We have been using Santyl to the new wound under silver alginate and silver alginate to the left ischial wound. 09/11/2021: Unfortunately, there has been fairly substantial breakdown of both of her wounds. It sounds like the gel compound that the [employer] was being mixed in just leaked out everywhere and left the patient wet and macerated. There is worsening necrotic tissue and increased depth on the right ischial wound and the left one has extended further. They have not been using the zinc oxide as recommended. They contacted [facility] and were advised to simply apply the powder directly to the wounds. 10/09/2021: The right ischial wound has some necrotic tissue on the lateral aspect and into the base, but no purulent drainage and no surrounding erythema or induration. On the left ischial wound, there is leathery nonviable skin extending laterally from the main wound. No significant odor or drainage from this site, either. 11/05/2021: The right ischial wound has more necrotic fibrinous tissue at the base. The leathery eschar on the left ischial wound is  beginning to lift at one of the edges and may be amenable to debridement today. We have been using topical powdered mupirocin to her wounds with silver alginate. Patient History Information obtained from Patient. Family History Unknown History, Cancer - Mother, Diabetes - Maternal Grandparents, Heart Disease - Mother, Hypertension - Mother, Thyroid Problems - Mother, No family history of Hereditary Spherocytosis, Kidney Disease, Lung Disease, Seizures, Stroke, Tuberculosis. Social History Former smoker - ended on 11/06/2014, Marital Status - Married, Alcohol Use - Never, Drug Use - No History, Caffeine Use - Daily - T soda. ea, Medical History Eyes Denies history of Cataracts, Glaucoma, Optic Neuritis Ear/Nose/Mouth/Throat Denies history of Chronic sinus problems/congestion, Middle ear problems Hematologic/Lymphatic Patient has history of Anemia Denies history of Hemophilia, Human Immunodeficiency Virus, Lymphedema, Sickle Cell Disease Respiratory Denies history of Aspiration, Asthma, Chronic Obstructive Pulmonary Disease (COPD), Pneumothorax, Sleep Apnea, Tuberculosis Cardiovascular Patient has history of Hypertension Denies history of Angina, Arrhythmia, Congestive Heart Failure, Coronary Artery Disease, Deep Vein Thrombosis, Hypotension, Myocardial Infarction, Peripheral Arterial Disease, Peripheral Venous Disease, Phlebitis, Vasculitis Gastrointestinal Denies history of Cirrhosis , Colitis, Crohnoos, Hepatitis A, Hepatitis B, Hepatitis C Endocrine Patient has history of Type II Diabetes Denies history of Type I Diabetes Genitourinary Denies history of End Stage Renal Disease Immunological Denies history of Lupus Erythematosus, Raynaudoos, Scleroderma Integumentary (Skin) Denies history of History of Burn Musculoskeletal Patient has history of Osteoarthritis Denies history of Gout, Rheumatoid Arthritis, Osteomyelitis Neurologic Patient has history of Dementia,  Paraplegia Denies history of Neuropathy, Quadriplegia Oncologic Patient has history of Received Radiation - 2012 - right breast Psychiatric Denies history of Anorexia/bulimia, Confinement Anxiety Hospitalization/Surgery History - left knee replacement. - right breast lumpectomy. - cervical laminectomy with fusion. - hysterectomy. -  tear duct surgery. - T and A. - UTI. - suprapubic cath placement. - UTI. Medical A Surgical History Notes nd Gastrointestinal constipation GERD Genitourinary UTI kidney stones neurogenic bladder Integumentary (Skin) left gluteal fold sacral Musculoskeletal cervical neuropathy osteomyelitis Psychiatric admitted for suicide risk on 12/02/2018 Objective Constitutional Slightly hypertensive. No acute distress.. Vitals Time Taken: 11:10 AM, Height: 63 in, Weight: 185 lbs, BMI: 32.8, Temperature: 97.5 F, Pulse: 76 bpm, Respiratory Rate: 18 breaths/min, Blood Pressure: 148/77 mmHg. Respiratory Normal work of breathing on room air.. General Notes: 11/05/2021: The right ischial wound has more necrotic fibrinous tissue at the base. The leathery eschar on the left ischial wound is beginning to lift at one of the edges and may be amenable to debridement today. Integumentary (Hair, Skin) Wound #12 status is Open. Original cause of wound was Pressure Injury. The date acquired was: 09/06/2018. The wound has been in treatment 148 weeks. The wound is located on the Left Ischial Tuberosity. The wound measures 3cm length x 9cm width x 1.3cm depth; 21.206cm^2 area and 27.567cm^3 volume. There is Fat Layer (Subcutaneous Tissue) exposed. There is no tunneling noted, however, there is undermining starting at 12:00 and ending at 6:00 with a maximum distance of 1.4cm. There is a large amount of serosanguineous drainage noted. The wound margin is well defined and not attached to the wound base. There is small (1-33%) red, pink granulation within the wound bed. There is a large  (67-100%) amount of necrotic tissue within the wound bed including Eschar and Adherent Slough. Wound #15 status is Open. Original cause of wound was Pressure Injury. The date acquired was: 07/21/2021. The wound has been in treatment 14 weeks. The wound is located on the Right Ischium. The wound measures 2.5cm length x 2cm width x 0.8cm depth; 3.927cm^2 area and 3.142cm^3 volume. There is Fat Layer (Subcutaneous Tissue) exposed. There is no tunneling or undermining noted. There is a medium amount of serosanguineous drainage noted. The wound margin is flat and intact. There is small (1-33%) red granulation within the wound bed. There is a large (67-100%) amount of necrotic tissue within the wound bed including Adherent Slough. Assessment Active Problems ICD-10 Pressure ulcer of left buttock, stage 4 Type 2 diabetes mellitus with other skin ulcer Pressure ulcer of right buttock, stage 3 Procedures Wound #12 Pre-procedure diagnosis of Wound #12 is a Pressure Ulcer located on the Left Ischial Tuberosity . There was a Excisional Skin/Subcutaneous Tissue Debridement with a total area of 18 sq cm performed by Fredirick Maudlin, MD. With the following instrument(s): Blade, Curette, and Forceps to remove Viable and Non-Viable tissue/material. Material removed includes Fat, Eschar, Subcutaneous Tissue, and Slough after achieving pain control using Lidocaine 4% Topical Solution. No specimens were taken. A time out was conducted at 11:40, prior to the start of the procedure. A Minimum amount of bleeding was controlled with Pressure. The procedure was tolerated well with a pain level of 1 throughout and a pain level of 1 following the procedure. Post Debridement Measurements: 3cm length x 9cm width x 1.3cm depth; 27.567cm^3 volume. Post debridement Stage noted as Category/Stage IV. Character of Wound/Ulcer Post Debridement requires further debridement. Post procedure Diagnosis Wound #12: Same as  Pre-Procedure Wound #15 Pre-procedure diagnosis of Wound #15 is a Pressure Ulcer located on the Right Ischium . There was a Excisional Skin/Subcutaneous Tissue Debridement with a total area of 5 sq cm performed by Fredirick Maudlin, MD. With the following instrument(s): Curette, Forceps, and Scissors to remove Viable and Non-Viable tissue/material.  Material removed includes Subcutaneous Tissue and Slough and after achieving pain control using Lidocaine 4% T opical Solution. No specimens were taken. A time out was conducted at 11:40, prior to the start of the procedure. A Minimum amount of bleeding was controlled with Pressure. The procedure was tolerated well with a pain level of 1 throughout and a pain level of 1 following the procedure. Post Debridement Measurements: 2.5cm length x 2cm width x 0.8cm depth; 3.142cm^3 volume. Post debridement Stage noted as Category/Stage III. Character of Wound/Ulcer Post Debridement requires further debridement. Post procedure Diagnosis Wound #15: Same as Pre-Procedure Plan Follow-up Appointments: Return appointment in 1 month. - Dr. Celine Ahr - Room 1 Tuesday 8/29 @ 10:30 am Bathing/ Shower/ Hygiene: May shower and wash wound with soap and water. - with dressing changes Edema Control - Lymphedema / SCD / Other: Elevate legs to the level of the heart or above for 30 minutes daily and/or when sitting, a frequency of: - throughout the day Off-Loading: Turn and reposition every 2 hours - *** Try to shift from side to side and shift to belly (if tolerated),****avoid sitting up in bed except for meals Additional Orders / Instructions: Follow Nutritious Diet - protein shakes 2-3 times per day, Juven 2 times per day Home Health: No change in wound care orders this week; continue Home Health for wound care. May utilize formulary equivalent dressing for wound treatment orders unless otherwise specified. New wound care orders this week; continue Home Health for wound  care. May utilize formulary equivalent dressing for wound treatment orders unless otherwise specified. - use santyl in base of both wound beds, discontinue antibiotic powder Dressing changes to be completed by [facility] on Monday / Wednesday / Friday except when patient has scheduled visit at [facility]. - increase visits to 3 times per week (spouse having difficulty with dressing changes) Other Home Health Orders/Instructions: - [facility] WOUND #12: - Ischial Tuberosity Wound Laterality: Left Cleanser: Wound Cleanser (Generic) 1 x Per Day/30 Days Discharge Instructions: Cleanse the wound with wound cleanser prior to applying a clean dressing using gauze sponges, not tissue or cotton balls. Cleanser: Soap and Water 1 x Per Day/30 Days Discharge Instructions: May shower and wash wound with dial antibacterial soap and water prior to dressing change. Cleanser: Wound Cleanser (DME) (Generic) 1 x Per Day/30 Days Discharge Instructions: Cleanse the wound with wound cleanser prior to applying a clean dressing using gauze sponges, not tissue or cotton balls. Peri-Wound Care: Skin Prep (DME) (Generic) 1 x Per Day/30 Days Discharge Instructions: Use skin prep as directed Peri-Wound Care: Zinc Oxide Ointment 30g tube 1 x Per Day/30 Days Discharge Instructions: Apply Zinc Oxide to periwound with each dressing change Prim Dressing: KerraCel Ag Gelling Fiber Dressing, 4x5 in (silver alginate) (DME) (Generic) 1 x Per Day/30 Days ary Discharge Instructions: Apply silver alginate to wound bed as instructed Prim Dressing: Santyl Ointment 1 x Per Day/30 Days ary Discharge Instructions: Apply nickel thick amount to wound bed and then pack with alginate Secondary Dressing: Woven Gauze Sponge, Non-Sterile 4x4 in (DME) (Generic) 1 x Per Day/30 Days Discharge Instructions: Apply over primary dressing as directed. Secondary Dressing: Zetuvit Plus Silicone Border Dressing 7x7(in/in) (DME) (Generic) 1 x Per  Day/30 Days Discharge Instructions: Apply silicone border over primary dressing as directed. WOUND #15: - Ischium Wound Laterality: Right Cleanser: Soap and Water 1 x Per Day/30 Days Discharge Instructions: May shower and wash wound with dial antibacterial soap and water prior to dressing change.  Cleanser: Wound Cleanser (DME) (Generic) 1 x Per Day/30 Days Discharge Instructions: Cleanse the wound with wound cleanser prior to applying a clean dressing using gauze sponges, not tissue or cotton balls. Peri-Wound Care: Skin Prep (DME) (Generic) 1 x Per Day/30 Days Discharge Instructions: Use skin prep as directed Peri-Wound Care: Zinc Oxide Ointment 30g tube 1 x Per Day/30 Days Discharge Instructions: Apply Zinc Oxide to periwound with each dressing change Prim Dressing: KerraCel Ag Gelling Fiber Dressing, 4x5 in (silver alginate) (Generic) 1 x Per Day/30 Days ary Discharge Instructions: Apply silver alginate to wound bed as instructed Prim Dressing: Santyl Ointment 1 x Per Day/30 Days ary Discharge Instructions: Apply nickel thick amount to wound bed then pack with silver alginate Secondary Dressing: Woven Gauze Sponge, Non-Sterile 4x4 in (DME) (Generic) 1 x Per Day/30 Days Discharge Instructions: Apply over primary dressing as directed. Secondary Dressing: Zetuvit Plus Silicone Border Dressing 5x5 (in/in) (DME) (Generic) 1 x Per Day/30 Days Discharge Instructions: Apply silicone border or ABD padover primary dressing as directed. 11/05/2021: The right ischial wound has more necrotic fibrinous tissue at the base. The leathery eschar on the left ischial wound is beginning to lift at one of the edges and may be amenable to debridement today. I used a combination of curette, forceps and scissors, and a scalpel to debride slough and nonviable subcutaneous tissue from the right gluteal wound and the heavy leathery eschar along with nonviable subcutaneous tissue and fat from the large left ischial  wound. I think both of these wounds will benefit from [facility] application to try and keep them cleaner. The patient's spouse said that they had Santyl at home, so new prescription was not written. Continue silver alginate and offloading efforts. Follow-up in 1 month. Electronic Signature(s) Signed: 11/05/2021 12:15:57 PM By: Fredirick Maudlin MD FACS Entered By: Fredirick Maudlin on 11/05/2021 12:15:57 -------------------------------------------------------------------------------- HxROS Details Patient Name: Date of Service: [name], [name]. 11/05/2021 10:30 A M Medical Record Number: [id number] Patient Account Number: [id number] Date of Birth/Sex: Treating RN: [birth year] (71 y.o. [name]: Other Clinician: [name] Referring Provider: Treating Provider/Extender: Nance Pew in Treatment: 148 Information Obtained From Patient Eyes Medical History: Negative for: Cataracts; Glaucoma; Optic Neuritis Ear/Nose/Mouth/Throat Medical History: Negative for: Chronic sinus problems/congestion; Middle ear problems Hematologic/Lymphatic Medical History: Positive for: Anemia Negative for: Hemophilia; Human Immunodeficiency Virus; Lymphedema; Sickle Cell Disease Respiratory Medical History: Negative for: Aspiration; Asthma; Chronic Obstructive Pulmonary Disease (COPD); Pneumothorax; Sleep Apnea; Tuberculosis Cardiovascular Medical History: Positive for: Hypertension Negative for: Angina; Arrhythmia; Congestive Heart Failure; Coronary Artery Disease; Deep Vein Thrombosis; Hypotension; Myocardial Infarction; Peripheral Arterial Disease; Peripheral Venous Disease; Phlebitis; Vasculitis Gastrointestinal Medical History: Negative for: Cirrhosis ; Colitis; Crohns; Hepatitis A; Hepatitis B; Hepatitis C Past Medical History Notes: constipation GERD Endocrine Medical History: Positive for: Type II Diabetes Negative for: Type I Diabetes Time  with diabetes: 1 year Treated with: Oral agents Blood sugar tested every day: No Genitourinary Medical History: Negative for: End Stage Renal Disease Past Medical History Notes: UTI kidney stones neurogenic bladder Immunological Medical History: Negative for: Lupus Erythematosus; Raynauds; Scleroderma Integumentary (Skin) Medical History: Negative for: History of Burn Past Medical History Notes: left gluteal fold sacral Musculoskeletal Medical History: Positive for: Osteoarthritis Negative for: Gout; Rheumatoid Arthritis; Osteomyelitis Past Medical History Notes: cervical neuropathy osteomyelitis Neurologic Medical History: Positive for: Dementia; Paraplegia Negative for: Neuropathy; Quadriplegia Oncologic Medical History: Positive for: Received Radiation - 2012 - right breast Psychiatric Medical History: Negative for: Anorexia/bulimia;  Confinement Anxiety Past Medical History Notes: admitted for suicide risk on 12/02/2018 Immunizations Pneumococcal Vaccine: Received Pneumococcal Vaccination: Yes Received Pneumococcal Vaccination On or After 60th Birthday: No Implantable Devices None Hospitalization / Surgery History Type of Hospitalization/Surgery left knee replacement right breast lumpectomy cervical laminectomy with fusion hysterectomy tear duct surgery Tand A UTI suprapubic cath placement UTI Family and Social History Unknown History: Yes; Cancer: Yes - Mother; Diabetes: Yes - Maternal Grandparents; Heart Disease: Yes - Mother; Hereditary Spherocytosis: No; Hypertension: Yes - Mother; Kidney Disease: No; Lung Disease: No; Seizures: No; Stroke: No; Thyroid Problems: Yes - Mother; Tuberculosis: No; Former smoker - ended on 11/06/2014; Marital Status - Married; Alcohol Use: Never; Drug Use: No History; Caffeine Use: Daily - T soda; Financial Concerns: No; ea, Food, Clothing or Shelter Needs: No; Support System Lacking: No; Transportation Concerns:  No Engineer, maintenance) Signed: 11/05/2021 1:14:05 PM By: Fredirick Maudlin MD FACS Signed: 11/05/2021 5:53:45 PM By: Baruch Gouty RN, BSN Entered By: Fredirick Maudlin on 11/05/2021 12:13:12 -------------------------------------------------------------------------------- SuperBill Details Patient Name: Date of Service: [name] 11/05/2021 Medical Record Number: [id number] Patient Account Number: [id number] Date of Birth/Sex: Treating RN: 1950/12/20 (71 y.o. Martyn [name], Vaughan Basta Primary Care Provider: [name] Other Clinician: Referring Provider: Treating Provider/Extender: Burgess Amor Weeks in Treatment: 148 Diagnosis Coding ICD-10 Codes Code Description [phone] Pressure ulcer of left buttock, stage 4 E11.622 Type 2 diabetes mellitus with other skin ulcer L89.313 Pressure ulcer of right buttock, stage 3 Facility Procedures The patient participates with Medicare or their insurance follows the Medicare Facility Guidelines: CPT4 Code Description Modifier Quantity [id number] 11042 - DEB SUBQ TISSUE 20 SQ CM/< 1 ICD-10 Diagnosis Description 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,  stage 4 L89.313 Pressure ulcer of right buttock, stage 3 The patient participates with Medicare or their insurance follows the Medicare Facility Guidelines: [id number] 11045 - DEB SUBQ TISS EA ADDL 20CM 1 ICD-10 Diagnosis Description 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, stage 4 L89.313 Pressure ulcer of right  buttock, stage 3 Physician Procedures : CPT4 Code Description Modifier [id number] - WC PHYS LEVEL 4 - EST PT 25 ICD-10 Diagnosis Description 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, stage 4 L89.313 Pressure ulcer of right buttock, stage 3 E11.622 Type 2 diabetes mellitus with other skin  ulcer Quantity: 1 : [id number] 11042 - WC PHYS SUBQ TISS 20 SQ CM ICD-10 Diagnosis Description 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, stage 4 L89.313 Pressure ulcer of right buttock, stage  3 Quantity: 1 : [id number] 88677 - WC PHYS SUBQ TISS EA ADDL 20 CM ICD-10 Diagnosis Description 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, stage 4 L89.313 Pressure ulcer of right buttock, stage 3 Quantity: 1 Electronic Signature(s) Signed: 11/05/2021 12:16:20 PM By: Fredirick Maudlin MD FACS Entered By: Fredirick Maudlin on 11/05/2021 12:16:19

## 2021-11-05 NOTE — Progress Notes (Signed)
[name], [name] ([id number]) Visit Report for 11/05/2021 Arrival Information Details Patient Name: Date of Service: [name], [name] 11/05/2021 10:30 A M Medical Record Number: [id number] Patient Account Number: [id number] Date of Birth/Sex: Treating RN: March 15, 1951 (71 y.o. [name] Primary Care Lumina Gitto: [name] Other Clinician: Referring Kenzy Campoverde: Treating Randol Zumstein/Extender: Nance Pew in Treatment: 148 Visit Information History Since Last Visit Added or deleted any medications: No Patient Arrived: Wheel Chair Any new allergies or adverse reactions: No Arrival Time: 11:08 Had a fall or experienced change in No Accompanied By: spouse activities of daily living that may affect Transfer Assistance: Manual risk of falls: Patient Identification Verified: Yes Signs or symptoms of abuse/neglect since last visito No Secondary Verification Process Completed: Yes Hospitalized since last visit: No Patient Requires Transmission-Based Precautions: No Implantable device outside of the clinic excluding No Patient Has Alerts: No cellular tissue based products placed in the center since last visit: Has Dressing in Place as Prescribed: Yes Pain Present Now: Yes Electronic Signature(s) Signed: 11/05/2021 5:53:45 PM By: Baruch Gouty RN, BSN Entered By: Baruch Gouty on 11/05/2021 11:10:25 -------------------------------------------------------------------------------- Lower Extremity Assessment Details Patient Name: Date of Service: [name]. 11/05/2021 10:30 A M Medical Record Number: [id number] Patient Account Number: [id number] Date of Birth/Sex: Treating RN: November 22, 1950 (71 y.o. [name] Primary Care Gavrielle Streck: [name] Other Clinician: Referring Avy Barlett: Treating Jeremaine Maraj/Extender: Nance Pew in Treatment: 148 Electronic Signature(s) Signed: 11/05/2021 5:53:45 PM By: Baruch Gouty RN, BSN Entered By:  Baruch Gouty on 11/05/2021 11:12:01 -------------------------------------------------------------------------------- Multi Wound Chart Details Patient Name: Date of Service: [name], [name]. 11/05/2021 10:30 A M Medical Record Number: [id number] Patient Account Number: [id number] Date of Birth/Sex: Treating RN: [birth year] (71 y.o. Martyn Malay, Vaughan Basta Primary Care Alessander Sikorski: [name] Other Clinician: Referring Akela Pocius: Treating Darnise Montag/Extender: Nance Pew in Treatment: 148 Vital Signs Height(in): 63 Pulse(bpm): 50 Weight(lbs): 185 Blood Pressure(mmHg): 148/77 Body Mass Index(BMI): 32.8 Temperature(F): 97.5 Respiratory Rate(breaths/min): 18 Photos: [N/A:N/A] Left Ischial Tuberosity Right Ischium N/A Wound Location: Pressure Injury Pressure Injury N/A Wounding Event: Pressure Ulcer Pressure Ulcer N/A Primary Etiology: Anemia, Hypertension, Type II Anemia, Hypertension, Type II N/A Comorbid History: Diabetes, Osteoarthritis, Dementia, Diabetes, Osteoarthritis, Dementia, Paraplegia, Received Radiation Paraplegia, Received Radiation 09/06/2018 07/21/2021 N/A Date Acquired: 148 14 N/A Weeks of Treatment: Open Open N/A Wound Status: No No N/A Wound Recurrence: 3x9x1.3 2.5x2x0.8 N/A Measurements L x W x D (cm) 21.206 3.927 N/A A (cm) : rea 27.567 3.142 N/A Volume (cm) : -8885.60% 76.40% N/A % Reduction in A rea: -12903.30% -89.20% N/A % Reduction in Volume: 12 Starting Position 1 (o'clock): 6 Ending Position 1 (o'clock): 1.4 Maximum Distance 1 (cm): Yes No N/A Undermining: Category/Stage IV Category/Stage III N/A Classification: Large Medium N/A Exudate A mount: Serosanguineous Serosanguineous N/A Exudate Type: red, brown red, brown N/A Exudate Color: Well defined, not attached Flat and Intact N/A Wound Margin: Small (1-33%) Small (1-33%) N/A Granulation A mount: Red, Pink Red N/A Granulation Quality: Large (67-100%) Large  (67-100%) N/A Necrotic A mount: Eschar, Adherent Slough Adherent Slough N/A Necrotic Tissue: Fat Layer (Subcutaneous Tissue): Yes Fat Layer (Subcutaneous Tissue): Yes N/A Exposed Structures: Fascia: No Fascia: No Tendon: No Tendon: No Muscle: No Muscle: No Joint: No Joint: No Bone: No Bone: No None None N/A Epithelialization: Debridement - Excisional Debridement - Excisional N/A Debridement: Pre-procedure Verification/Time Out 11:40 11:40 N/A Taken: Lidocaine 4% T opical Solution Lidocaine 4% Topical Solution N/A Pain Control:  Necrotic/Eschar, Fat, Subcutaneous, Subcutaneous, Slough N/A Tissue Debrided: Slough Skin/Subcutaneous Tissue Skin/Subcutaneous Tissue N/A Level: 18 5 N/A Debridement A (sq cm): rea Blade, Curette, Forceps Curette, Forceps, Scissors N/A Instrument: Minimum Minimum N/A Bleeding: Pressure Pressure N/A Hemostasis Achieved: 1 1 N/A Procedural Pain: 1 1 N/A Post Procedural Pain: Debridement Treatment Response: Procedure was tolerated well Procedure was tolerated well N/A Post Debridement Measurements L x 3x9x1.3 2.5x2x0.8 N/A W x D (cm) 27.567 3.142 N/A Post Debridement Volume: (cm) Category/Stage IV Category/Stage III N/A Post Debridement Stage: Debridement Debridement N/A Procedures Performed: Treatment Notes Electronic Signature(s) Signed: 11/05/2021 12:11:19 PM By: Fredirick Maudlin MD FACS Signed: 11/05/2021 5:53:45 PM By: Baruch Gouty RN, BSN Entered By: Fredirick Maudlin on 11/05/2021 12:11:18 -------------------------------------------------------------------------------- Multi-Disciplinary Care Plan Details Patient Name: Date of Service: [name], [name]. 11/05/2021 10:30 A M Medical Record Number: [id number] Patient Account Number: [id number] Date of Birth/Sex: Treating RN: Aug 22, 1950 (71 y.o. [name] Primary Care Eufemia Prindle: [name] Other Clinician: Referring Gurbani Figge: Treating Cejay Cambre/Extender: Nance Pew in Treatment: [place] reviewed with physician Active Inactive Pressure Nursing Diagnoses: Knowledge deficit related to management of pressures ulcers Potential for impaired tissue integrity related to pressure, friction, moisture, and shear Goals: Patient/caregiver will verbalize understanding of pressure ulcer management Date Initiated: 05/22/2021 Target Resolution Date: 12/03/2021 Goal Status: Active Interventions: Assess: immobility, friction, shearing, incontinence upon admission and as needed Assess offloading mechanisms upon admission and as needed Notes: Wound/Skin Impairment Nursing Diagnoses: Knowledge deficit related to ulceration/compromised skin integrity Goals: Patient/caregiver will verbalize understanding of skin care regimen Date Initiated: 12/30/2018 Target Resolution Date: 12/03/2021 Goal Status: Active Interventions: Assess patient/caregiver ability to obtain necessary supplies Assess patient/caregiver ability to perform ulcer/skin care regimen upon admission and as needed Assess ulceration(s) every visit Provide education on ulcer and skin care Treatment Activities: Skin care regimen initiated : 12/30/2018 Topical wound management initiated : 12/30/2018 Notes: 01/08/21: Wound care regimen continues Electronic Signature(s) Signed: 11/05/2021 5:53:45 PM By: Baruch Gouty RN, BSN Entered By: Baruch Gouty on 11/05/2021 11:31:50 -------------------------------------------------------------------------------- Pain Assessment Details Patient Name: Date of Service: [name]. 11/05/2021 10:30 A M Medical Record Number: [id number] Patient Account Number: [id number] Date of Birth/Sex: Treating RN: 02-15-1951 (71 y.o. [name] Primary Care Clifton Safley: [name] Other Clinician: Referring Abdirahman Chittum: Treating Latha Staunton/Extender: Nance Pew in Treatment: 148 Active  Problems Location of Pain Severity and Description of Pain Patient Has Paino Yes Site Locations Pain Location: Pain in Ulcers With Dressing Change: Yes Duration of the Pain. Constant / Intermittento Constant Rate the pain. Current Pain Level: 2 Worst Pain Level: 6 Least Pain Level: 1 Character of Pain Describe the Pain: Aching, Other: nerve pain Pain Management and Medication Current Pain Management: Medication: Yes Is the Current Pain Management Adequate: Adequate How does your wound impact your activities of daily livingo Sleep: Yes Bathing: No Appetite: No Relationship With Others: No Bladder Continence: No Emotions: Yes Bowel Continence: No Work: No Toileting: No Drive: No Dressing: No Hobbies: No Electronic Signature(s) Signed: 11/05/2021 5:53:45 PM By: Baruch Gouty RN, BSN Entered By: Baruch Gouty on 11/05/2021 11:11:55 -------------------------------------------------------------------------------- Patient/Caregiver Education Details Patient Name: Date of Service: [name] 8/1/2023andnbsp10:30 A M Medical Record Number: [id number] Patient Account Number: [id number] Date of Birth/Gender: Treating RN: [birth year] (71 y.o. [name] Primary Care Physician: [name] Other Clinician: Referring Physician: Treating Physician/Extender: Nance Pew in Treatment: [phone] Education Assessment Education Provided To: Patient  Education Topics Provided Pressure: Methods: Explain/Verbal Responses: Reinforcements needed, State content correctly Wound/Skin Impairment: Methods: Explain/Verbal Responses: Reinforcements needed, State content correctly Electronic Signature(s) Signed: 11/05/2021 5:53:45 PM By: Baruch Gouty RN, BSN Entered By: Baruch Gouty on 11/05/2021 11:32:18 -------------------------------------------------------------------------------- Wound Assessment Details Patient Name: Date of Service: [name]. 11/05/2021 10:30 A M Medical Record Number: [id number] Patient Account Number: [id number] Date of Birth/Sex: Treating RN: 25-Mar-1951 (71 y.o. Martyn Malay, Vaughan Basta Primary Care Siobhan Zaro: [name] Other Clinician: Referring Li Bobo: Treating Rosalia Mcavoy/Extender: Burgess Amor Weeks in Treatment: 148 Wound Status Wound Number: 12 Primary Pressure Ulcer Etiology: Wound Location: Left Ischial Tuberosity Wound Open Wounding Event: Pressure Injury Status: Date Acquired: 09/06/2018 Comorbid Anemia, Hypertension, Type II Diabetes, Osteoarthritis, Weeks Of Treatment: 148 History: Dementia, Paraplegia, Received Radiation Clustered Wound: No Photos Wound Measurements Length: (cm) 3 Width: (cm) 9 Depth: (cm) 1.3 Area: (cm) 21.206 Volume: (cm) 27.567 % Reduction in Area: -8885.6% % Reduction in Volume: -12903.3% Epithelialization: None Tunneling: No Undermining: Yes Starting Position (o'clock): 12 Ending Position (o'clock): 6 Maximum Distance: (cm) 1.4 Wound Description Classification: Category/Stage IV Wound Margin: Well defined, not attached Exudate Amount: Large Exudate Type: Serosanguineous Exudate Color: red, brown Foul Odor After Cleansing: No Slough/Fibrino Yes Wound Bed Granulation Amount: Small (1-33%) Exposed Structure Granulation Quality: Red, Pink Fascia Exposed: No Necrotic Amount: Large (67-100%) Fat Layer (Subcutaneous Tissue) Exposed: Yes Necrotic Quality: Eschar, Adherent Slough Tendon Exposed: No Muscle Exposed: No Joint Exposed: No Bone Exposed: No Electronic Signature(s) Signed: 11/05/2021 5:53:45 PM By: Baruch Gouty RN, BSN Entered By: Baruch Gouty on 11/05/2021 11:34:58 -------------------------------------------------------------------------------- Wound Assessment Details Patient Name: Date of Service: [name]. 11/05/2021 10:30 A M Medical Record Number: [id number] Patient Account Number: [id number] Date of  Birth/Sex: Treating RN: [birth year] (71 y.o. Martyn Malay, Vaughan Basta Primary Care Leyda Vanderwerf: [name] Other Clinician: Referring Reathel Turi: Treating Cadynce Garrette/Extender: Burgess Amor Weeks in Treatment: 148 Wound Status Wound Number: 15 Primary Pressure Ulcer Etiology: Wound Location: Right Ischium Wound Open Wounding Event: Pressure Injury Status: Date Acquired: 07/21/2021 Comorbid Anemia, Hypertension, Type II Diabetes, Osteoarthritis, Weeks Of Treatment: 14 History: Dementia, Paraplegia, Received Radiation Clustered Wound: No Photos Wound Measurements Length: (cm) 2.5 Width: (cm) 2 Depth: (cm) 0.8 Area: (cm) 3.927 Volume: (cm) 3.142 % Reduction in Area: 76.4% % Reduction in Volume: -89.2% Epithelialization: None Tunneling: No Undermining: No Wound Description Classification: Category/Stage III Wound Margin: Flat and Intact Exudate Amount: Medium Exudate Type: Serosanguineous Exudate Color: red, brown Foul Odor After Cleansing: No Slough/Fibrino Yes Wound Bed Granulation Amount: Small (1-33%) Exposed Structure Granulation Quality: Red Fascia Exposed: No Necrotic Amount: Large (67-100%) Fat Layer (Subcutaneous Tissue) Exposed: Yes Necrotic Quality: Adherent Slough Tendon Exposed: No Muscle Exposed: No Joint Exposed: No Bone Exposed: No Electronic Signature(s) Signed: 11/05/2021 5:53:45 PM By: Baruch Gouty RN, BSN Entered By: Baruch Gouty on 11/05/2021 11:36:24 -------------------------------------------------------------------------------- Vitals Details Patient Name: Date of Service: [name] 11/05/2021 10:30 A M Medical Record Number: [id number] Patient Account Number: [id number] Date of Birth/Sex: Treating RN: 1950/05/30 (71 y.o. [name] Primary Care Gaylynn Seiple: [name] Other Clinician: Referring [place] Duquette: Treating Chadwin Fury/Extender: Nance Pew in Treatment: 148 Vital Signs Time Taken:  11:10 Temperature (F): 97.5 Height (in): 63 Pulse (bpm): 76 Weight (lbs): 185 Respiratory Rate (breaths/min): 18 Body Mass Index (BMI): 32.8 Blood Pressure (mmHg): 148/77 Reference Range: 80 - 120 mg / dl Electronic Signature(s) Signed: 11/05/2021 5:53:45 PM By: Baruch Gouty RN, BSN Entered By: Johna Roles,  Linda on 11/05/2021 11:10:55

## 2021-12-03 ENCOUNTER — Encounter (HOSPITAL_BASED_OUTPATIENT_CLINIC_OR_DEPARTMENT_OTHER): Payer: Medicare Other | Admitting: General Surgery

## 2021-12-03 DIAGNOSIS — E11622 Type 2 diabetes mellitus with other skin ulcer: Secondary | ICD-10-CM | POA: Diagnosis not present

## 2021-12-03 NOTE — Progress Notes (Signed)
[name], [name] ([id number]) Visit Report for 12/03/2021 Chief Complaint Document Details Patient Name: Date of Service: [name], [name] 12/03/2021 10:30 A M Medical Record Number: [id number] Patient Account Number: [id number] Date of Birth/Sex: Treating RN: September 07, 1950 (71 y.o. [name]: [name] Other Clinician: Referring Provider: Treating Provider/Extender: Nance Pew in Treatment: [place] from: Patient Chief Complaint She is here for follow up evaluation of left ischial pressure ulcer 12/30/2018; patient comes back for review of wounds in the same general area over the previously healed left ischial pressure ulcer Electronic Signature(s) Signed: 12/03/2021 11:25:42 AM By: Fredirick Maudlin MD FACS Entered By: Fredirick Maudlin on 12/03/2021 11:25:41 -------------------------------------------------------------------------------- Debridement Details Patient Name: Date of Service: [name]. 12/03/2021 10:30 A M Medical Record Number: [id number] Patient Account Number: [id number] Date of Birth/Sex: Treating RN: 10-Oct-1950 (71 y.o. [name]: [name] Other Clinician: Referring Provider: Treating Provider/Extender: Nance Pew in Treatment: 152 Debridement Performed for Assessment: Wound #12 Left Ischial Tuberosity Performed By: Physician Fredirick Maudlin, MD Debridement Type: Debridement Level of Consciousness (Pre-procedure): Awake and Alert Pre-procedure Verification/Time Out Yes - 11:15 Taken: Start Time: 11:17 Pain Control: Lidocaine 4% T opical Solution T Area Debrided (L x W): otal 3 (cm) x 8 (cm) = 24 (cm) Tissue and other material debrided: Viable, Non-Viable, Slough, Subcutaneous, Slough Level: Skin/Subcutaneous Tissue Debridement Description: Excisional Instrument: Curette Bleeding: Minimum Hemostasis Achieved:  Pressure Procedural Pain: Insensate Post Procedural Pain: Insensate Response to Treatment: Procedure was tolerated well Level of Consciousness (Post- Awake and Alert procedure): Post Debridement Measurements of Total Wound Length: (cm) 3 Stage: Category/Stage IV Width: (cm) 8.3 Depth: (cm) 1.6 Volume: (cm) 31.29 Character of Wound/Ulcer Post Debridement: Improved Post Procedure Diagnosis Same as Pre-procedure Electronic Signature(s) Signed: 12/03/2021 12:03:01 PM By: Fredirick Maudlin MD FACS Signed: 12/03/2021 6:08:33 PM By: Baruch Gouty RN, BSN Entered By: Baruch Gouty on 12/03/2021 11:21:44 -------------------------------------------------------------------------------- Debridement Details Patient Name: Date of Service: [name]. 12/03/2021 10:30 A M Medical Record Number: [id number] Patient Account Number: [id number] Date of Birth/Sex: Treating RN: Oct 30, 1950 (71 y.o. [name]: [name] Other Clinician: Referring Provider: Treating Provider/Extender: Nance Pew in Treatment: 152 Debridement Performed for Assessment: Wound #15 Right Ischium Performed By: Physician Fredirick Maudlin, MD Debridement Type: Debridement Level of Consciousness (Pre-procedure): Awake and Alert Pre-procedure Verification/Time Out Yes - 11:15 Taken: Start Time: 11:17 Pain Control: Lidocaine 4% T opical Solution T Area Debrided (L x W): otal 2 (cm) x 1.7 (cm) = 3.4 (cm) Tissue and other material debrided: Viable, Non-Viable, Slough, Subcutaneous, Slough Level: Skin/Subcutaneous Tissue Debridement Description: Excisional Instrument: Curette Bleeding: Minimum Hemostasis Achieved: Pressure Procedural Pain: Insensate Post Procedural Pain: Insensate Response to Treatment: Procedure was tolerated well Level of Consciousness (Post- Awake and Alert procedure): Post Debridement Measurements of Total Wound Length: (cm) 2 Stage:  Category/Stage III Width: (cm) 1.7 Depth: (cm) 1 Volume: (cm) 2.67 Character of Wound/Ulcer Post Debridement: Improved Post Procedure Diagnosis Same as Pre-procedure Electronic Signature(s) Signed: 12/03/2021 12:03:01 PM By: Fredirick Maudlin MD FACS Signed: 12/03/2021 6:08:33 PM By: Baruch Gouty RN, BSN Entered By: Baruch Gouty on 12/03/2021 11:21:58 -------------------------------------------------------------------------------- HPI Details Patient Name: Date of Service: [name]. 12/03/2021 10:30 A M Medical Record Number: [id number] Patient Account Number: [id number] Date of Birth/Sex: Treating RN: 20-Jan-1951 (71 y.o. [name]: [name] Other Clinician: Referring  Provider: Treating Provider/Extender: Nance Pew in Treatment: 152 History of Present Illness Location: open ulceration of the left gluteal area, left heel and right ankle for about 5 months. Quality: Patient reports No Pain. Severity: Patient states wound(s) are getting worse. Duration: Patient has had the wound for > 5 months prior to seeking treatment at the wound center Context: The wound occurred when the patient has been paraplegic for about 3 years. Modifying Factors: Wound improving due to current treatment. ssociated Signs and Symptoms: Patient reports having foul odor. A HPI Description: this 71 year old patient who is known to have hypertension, hypothyroidism, breast cancer, chronic pain syndrome, paraplegia was noted to have a left gluteal decubitus ulcer and was brought into the hospital. During the course of her hospitalization she was debrided in the operating room by ankle wound. Bone cultures were taken at that time but were negative but clinically she was treated for osteomyelitis because of the probing down to bone and open exposed bone. Home health has been giving her antibioticss which include vancomycin and Zosyn. The patient  was a smoker until about 3 weeks ago and used to smoke about 10 cigarettes a day for a long while. 12/13/2014 - details of her operative note from 11/03/2014 were reviewed -- PROCEDURE: 1. Excisional debridement skin, subcutaneous, muscle left ischium 35 cm2 2. Excisional debridement skin, subcutaneous tissue left heel 27 cm2 3. Excisional debridement right ankle skin, subcutaneous, bone 30 cm2 01/24/2015 -- she has some issues with her wheelchair cushion but other than that is doing very well and has received Podus boots for her feet. 02/14/2015 -- she was using her old offloading boots and this seemed to have caused her a new pressure ulcer on the left posterior heel near the superior part just below the Achilles tendon. 03/07/2015 -- she has a new ulceration just to the left of the midline on her sacral region more on the left buttock and this has been there for Dr. Leland Johns and had all the wounds sharply debrided. The debridement was done for the left ischial wound, the left heel wound and the right about a week. 08/22/2015 -- was recently admitted to hospital between May 5 and 08/13/2015, with sepsis and leukocytosis due to a UTI. she was treated for a sepsis complicating Escherichia coli UTI and kidney stones. She also had metabolic and careful up at the secondary to pyelonephritis. He received broad-spectrum antibiotics initially and then received Macrobid as per urology. She was sent home on nitrofurantoin. during her admission she had a CT scan which showed exposed left ischial tuberosity without evidence of osteolysis. 09/12/2015-- the patient is having some issues with her air mattress and would like to get a opinion from medical modalities. 10/10/2015 -- the issue with her air mattress has not yet been sorted out and the new problem seems to be a lot of odor from the wound VAC. 11/27/2015 -- the patient was admitted to the hospital between July 23 and 10/31/2015. Her problems were sepsis,  osteomyelitis of the pelvic bone and acute pyelonephritis. CT of the abdomen and pelvis was consistent with a left-sided pyelonephritis with hydronephrosis and also just showed new sclerosis of the posterior portion of the left anterior pubic ramus suggestive of periosteal reaction consistent with osteomyelitis. She was treated for the osteomyelitis with infectious disease consult recommending 6 weeks of IV antibiotics including vancomycin and Rocephin and the antibiotics were to go on until 12/10/2015. He was seen by Dr. Iran Planas plastic surgery and  Dr. Linus Salmons of infectious disease. She had a suprapubic catheter placed during the admission. CT scan done on 10/28/2015 showed specifically -- New sclerosis of the posterior portion of the left inferior pubic ramus with aggressive periosteal reaction, consistent with osteomyelitis, with adjacent soft tissue gas compatible with previously described decubitus ulcer. 12/12/2015 -- she was recently seen by Dr. Linus Salmons, who noted good improvement and CRP and ESR compared to before and he has stopped her antibiotic as per plans to finish on September 4. The patient was encouraged to continue with wound care and consider hyperbaric oxygen therapy. Today she tells me that she has consented to undergo hyperbaric oxygen therapy and we can start the paperwork. 01/02/2016 -- her PCP had gained about 3 years but she still persists in having problems during hyperbaric oxygen therapy with some discomfort in the ears. 01/09/16; pressure area with underlying osteomyelitis in the left buttock. Wound bed itself has some slight amount of grayish surface slough however I do not think any debridement was necessary. There is no exposed bone soft tissue appears stable. She is using a wound VAC 01/16/16; back for weekly wound review in conjunction with HBO. She has a deep wound over the left initial tuberosity previously treated with 6 weeks of IV antibiotics for osteomyelitis. Wound  bed looks reasonably healthy although the base of this is still precariously close to bone. She has been using a wound VAC. 01/23/2016 -- she has completed her course of antibiotics and this week the only new thing is her right great toe nail was avulsed and she has got an open wound over the nailbed. 01/31/16 she has completed her course of antibiotics. Her right great toenail avulsed last week and she's been using silver alginate for this as well. Still using a wound VAC to the substantial stage IV wound over the left ischial tuberosity 03/05/2016 -- the patient has had a opinion from the plastic surgery group at [facility] and details of this are not available yet but the patient's verbal report has been heard by me. Did not sound like there was any optimistic discussion regarding reconstruction and the net result would be to continue with the wound VAC application. I will await the official reports. Addendum: -- she was seen at [place] surgery service by Dr. Tressa Busman. After a thorough review and from what I understand spending 45 minutes with the patient his assessment has been noted by me in detail and the management options were: 1. Continued pressure offloading and wound care versus operative procedures including wound excision 2. Soft tissue and bone sampling 3. If the wound gets larger wound closure would be done using a variety of plastic surgical techniques including but not limited to skin substitute, possible skin graft, local versus regional flaps, negative pressure dressing application. 4. He discussed with her details of flap surgery and the risks associated 5. He made a comment that since the patient was operated on by Dr. Leland Johns of [facility] plastic surgery unit in [place] the patient may continue to follow-up there for further evaluation for surgical flap closure in the future. 03/19/2016 -- the patient continues to be rather  depressed and frustrated with her lack of rapid progress in healing this wound especially because she thought after hyperbaric oxygen therapy the wound would heal extremely fast. She now understands that was not the implied benefit on wound care which was the recommendation for hyperbaric oxygen therapy. I have had a lengthy discussion  with the patient and her husband regarding her options: 1. Continue with collagen and wound VAC for the primary dressing and offloading and all supportive care. 2. See Dr. Iran Planas for possible placement of Acell or Integra in the OR. 3. get a second opinion from a wound care center and surrounding regions/counties 05/07/2016 -- Note from Dr. Celedonio Miyamoto, who noted that the patient has declined flap surgery. She has discussed application of A cell, and try a few applications to see how the wound progresses. She is also recommended that we could apply products here in the wound center, like Oasis. during her preop workup it was found that her hemoglobin A1c was 11% and she has now been diagnosed as having diabetes mellitus and has been put on appropriate treatment by her PCP 05/28/2016 -- tells me her blood sugars have been doing well and she has an appointment to see her PCP in the next couple of weeks to check her hemoglobin A1c. Other than that she continues to do well. 06/25/2016 -- have not seen her back for the last month but she says her health has been about the same and she has an appointment to check the A1c next week 09/10/16 ---- was seen by Dr. Celedonio Miyamoto -- who applied Acell and saw her back in follow-up. She has recommended silver alginate to the wound every other day and cover with foam. If no significant drainage could transition to collagen every other day. She recommended discontinuing wound VAC. There were no plans to repeat application of Acell. The patient expressed that her husband could do the wound care as going to the Wound Ctr., would  cost several $100 for each visit. 10/21/2016 -- her insurance company is getting her new mattress and she is pleased about that. Other than that she has been doing dressings with PolyMem Silver and has been doing very well 02/18/2017 -- she has gone through several changes of her mattress and has not been pleased with any of them. The ventricles are still working on trying to fit her with the appropriate low air-loss mattress. She has a new wound on the gluteal area which is clearly separated from the original wound. 03/25/17-she is here in follow-up evaluation for her left ischialpressure ulcer. She remains unsatisfied with her pressure mattress. She admits to sitting multiple hours a day, in the bed. We have discussed offloading options. The wound does not appear infected. Nutrition does not appear to be a concern. Will follow-up in 4 weeks, if wound continues to be stalled may consider x-ray to evaluate for refractory osteomyelitis. 04/21/17; this is a patient that I don't know all that well. She has a chronic wound which at one point had underlying osteomyelitis in the left ischial tuberosity. This is a stage IV pressure ulcer. Over the last 3 months she has a stage II wound inferiorly to the original wound. The last time she was here her dressing was changed to silver collagen although the patient's husband who changes the dressing said that the collagen stuck to the wound and remove skin from the superficial area therefore he switched back to [place] 05/13/17; this is a patient we've been following for a left ischial tuberosity wound which was stage IV at one point had underlying osteomyelitis. Over the last several months she's had a stage II wound just inferior and medial to the related to the wound. According to her husband he is using Endoform layer with collagen although this is not what I  had last time. According to her husband they are using Elgie Congo with collagen although I don't  quite know how that started. She was hospitalized from 1/20 through 04/30/16. This was related to a UTI. Her blood cultures were negative, urine culture showed multiple species. She did have a CT scan of the abdomen and pelvis which documented chronic osteomyelitis in the area of the wound inflammatory markers were unremarkable. She has had prior knowledge of osteomyelitis. It looks as though she received IV antibiotics in 2017 and was treated with a course of hyperbaric oxygen. 05/28/17; the wound over the left ischial tuberosity is deeper today and abuts clearly on bone. Nursing intake reported drainage. I therefore culture of the wound. The more superficial area just below this looks about the same. They once again complained that there are mattress cover is not working although apparently [facility] is been noted to see this many times in the report is that the device is functional 06/18/17; the patient had a probing area on the left ischial tuberosity that was draining purulent fluid last time. This also clearly seemed to have open bone. Culture I did showed pansensitive pseudomonas including third generation cephalosporins. I treated this with cefdinir 300 twice a day for 10 days and things seem to have improved. She has a more superficial wound just underneath this area. Amazingly she has a new air mattress through [facility]. I think they gave this to her as a parking give. In any case this now works according to the patient may have something to do with why the areas are looking better. 07/09/17; the patient has a probing area in the left ischial tuberosity that still has some depth. However this is contracted in terms of the wound orifice although the depth is still roughly the same. There is no undermining. She also has the satellite wound which is more superficial. This appears to have a healthy surface we've been using silver collagen 08/06/17; the patient's wound is over the  left ischial tuberosity and a satellite lesion just underneath this. The original wound was actually a deep stage 4 wound. We have made good progress in 2 months and there is no longer exposed bone here. 09/03/17; left ischial tuberosity actually appears to be quite healthy. I think we are making progress. No debridement is required. There is no surrounding erythema 10/01/17 I follow this patient monthly for her left ischial tuberosity wound. There is 2 areas the original area and a satellite area. The satellite area looks a lot better there is no surrounding erythema. Her husband relates that he is having trouble maintaining the dressing. This has to do with the soft tissue around it. He states he puts the collagen in but he cannot make sure that it stays in even with the ABD pads and tape that he is been using 10/29/17; patient arrives with a better looking noon today. Some of the satellite lesions have closed. using Prisma 11/26/17; the patient has a large cone-shaped area with the tip of the Cone deep within her buttock soft tissue. The walls of the Cone are epithelialized however the base is still open. The area at the base of this looks moist we've been using silver collagen. Will change to silver alginate 12/31/2017; the wound appears to have come in fairly nicely. Using silver alginate. There is no surrounding maceration or infection 01/28/18; there is still an open area here over the left initial tuberosity. Base of this however looks healthy. There  is no surrounding infection 02/25/18; the area of its open is over the left ischial tuberosity. The base of this is where the wound is. This is a large inverted cone-shaped area with the wound at the tip. Dimensions of the wound at the tip are improved. There is a area of denuded skin about halfway towards the tip which her husband thinks may have happened today when he was bathing her. 04/20/17; the area is still open over the left initial tuberosity.  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 This is now completely closed over although it is closed over with a divot and skin over bone at the base of this. Continued aggressive offloading will be necessary. 12/30/2018 READMISSION This is a 71 year old woman with chronic paraplegia. I picked her up for her care from Dr. Con Memos in this clinic after he departed. She had a stage IV pressure wound over the left ischial tuberosity. She was treated twice for her underlying osteomyelitis and this I believe firstly in 2016 and again in 2017. There were some plans at some point for flap closure of this however she was discovered to have uncontrolled diabetes and I do not think this was ever accomplished. She ultimately healed over in this clinic and was discharged in May. She has a large cone-shaped indentation with the tip of this going towards the left ischial tuberosity. It is not an easy area to examine but at that time I thought all of this was epithelialized. Apparently there was a reopening here shortly after she left the clinic last time. She was admitted to hospital at the end of June for Klebsiella bacteremia felt to be secondary to UTI. A CT scan of the pelvis is listed below and there was initially some concern that she had underlying osteomyelitis although I believe she was seen by infectious disease and that was felt to be not the case: I do not see any new cultures or inflammatory markers IMPRESSION: 1. No CT evidence for acute intra-abdominal or pelvic abnormality. Large volume of stool throughout the colon. 2. Enlarged fatty liver with fat sparing near the gallbladder fossa 3. Cortical scarring right kidney. Bilateral intrarenal stones without hydronephrosis. Thick-walled urinary bladder  decompressed by suprapubic catheter 4. Deep left decubitus ulcer with underlying left ischial changes suggesting osteomyelitis. Her husband has been using silver collagen in the wound. She has not been systemically unwell no fever chills eating and drinking well. They rigorously offload this wound only getting up in the wheelchair when she is going to appointments the rest of the time she is in bed. 10/8; wound measures larger and she now has exposed bone. We have been using silver alginate 11/12 still using silver alginate. The patient saw Dr. Megan Salon of infectious disease. She was started on Augmentin 500 mg twice daily. She is due to follow-up with Dr. Megan Salon I believe next week. Lab work Dr. Megan Salon requested showed a sedimentation rate of 28 and CRP of 20 although her CRP 1 year ago was 18.8. Sedimentation rate 1 year ago was 11 basic metabolic panel showed a creatinine of 1.12 12/3; the patient followed up with Dr. Megan Salon yesterday. She is still on Augmentin twice daily. This was directed by Dr. Megan Salon. The patient's inflammatory markers have improved which is gratifying. Her C-reactive protein  was repeated yesterday and follow-up booked with infectious disease in January. In addition I have been getting secure text messages I think from palliative care through [facility] [employer]. I think they were hoping to provide services to the patient in her home. They could not get a hold of the primary physician and so they reached out to me on 2 separate occasions. 12/17; patient last saw Dr. Megan Salon on 12/2. She is finishing up with Augmentin. Her C-reactive protein was 20 on 10/21, 10.1 on 11/19 and 17 on 12/2. The wound itself still has depth and undermining. We are using Santyl with the backing wet-to-dry 04/27/2019. The wound is gradually clearing up in terms of the surface although it is not filled in that much. Still abuts right against bone 2/4; patient with a deep  pressure ulcer over the left ischial tuberosity. I thought she was going to follow-up with infectious disease to follow her inflammatory markers although the patient states that they stated that they did not need to see her unless we felt it was necessary. I will need to check their notes. In any case we ordered moistened silver collagen back with wet-to-dry to fill in the depth of the wound although apparently [facility] sent silver alginate which they have been using since they were here the last time. Is obviously not what we ordered. 2/25. Not much change in this wound it is over the left ischial tuberosity recurrent wound. We have been using silver collagen with backing wet-to-dry. I think the wound is about the same. There is still some tunneling from about 10-12 o'clock over the ischial tuberosity itself 3/11; pressure ulcer over the left ischial tuberosity. Since she was last here the wound VAC was started and apparently going quite well. We are able to get the home health company that accepts [place] [facility] which is in itself sometimes problematic. There is been improvements in the wound the tunneling seems to be better and is contracted nicely 4/8; 1 month follow-up. Since she was last here we have been using silver collagen under a wound VAC. Some minor contraction I think in wound volume. She is cared for diligently by her husband including pressure relief, incontinence management, nutritional support etc. 6/1; this is almost a 40-monthfollow-up. She is been using silver collagen under wound VAC. Circular area over the left ischial tuberosity. She has been using silver collagen under wound VAC 7/8; 1 month follow-up. Silver collagen under the VAC not really a lot of progress. Tissue at the base of the wound which is right against bone and the tissue next that this does not look completely viable. She is not currently on any antibiotics, she had underlying osteomyelitis I need to look this  over 8/16; we are using silver collagen under wound VAC to the left ischial tuberosity wound. Comes in today with absolutely no change in surface area or depth. There is no exposed bone. I did look over her infectious disease notes as I said I would do last time. She last saw Dr. CMegan Salonin December 2020. She completed 6 weeks of Augmentin. This was in response to a bone culture I did showing methicillin susceptible staph aureus and Enterococcus. She was supposed to come back to see Dr. CMegan Salonat some point although they say that that appointment was canceled unless I chose to recommend return. I think there was supposed to be follow-up with inflammatory markers but I cannot see that that was ever done. She has  not been on antibiotics since 9/21; monthly follow-up. We received a call from home health nurse last evening to report green drainage coming out of the wound. Lab work I ordered last time showed a white count of 5.2 a sedimentation rate of 45 and a C-reactive protein of 25 however neither one of the 2 values are substantially different from her previous values in October 2020 or December 2020. Both are slightly higher but only marginally. Otherwise no new complaints from the patient or her husband 10/19; 1 month follow-up. PCR culture I did last time showed medium quantities of Pseudomonas lower quantities Klebsiella and Enterococcus faecalis group B strep and Peptostreptococcus. I gave her Augmentin for 2 weeks. I am not really sure of my choice of this I would not cover Pseudomonas. She is still having green drainage. Wound itself looks satisfactory there is not a lot of depth wound bed looks healthy 11/16; patient has completed the antibiotics still using gentamicin and silver alginate on the wound. There is improvement in the surface area 12/21; in general the area on the buttock looks somewhat better. Surface looks healthy although I do not know that there is been much improvement in  the wound volume. We have been using silver alginate and Hydrofera Blue. Less drainage. In passing the husband showed me an abrasion injury on the left anterior tibia. Covered in necrotic surface. He has noticed this for about a week and has been putting silver collagen on it. He is completely uncertain about how this happened 1/25; monthly follow-up. The area on the left buttock is about the same. This does not go to bone but a fairly deep wound surface of the wound is of questionable viability. The abrasion injury that they showed me last time apparently was closed out by home health because they thought it was healed but certainly is not although it is just about healed. As a result they haven't been applying anything to this area Finally I did discuss with the patient and her husband the idea of an advanced treatment product to try and get a proper base to this wound I was thinking of Puraply however actually the patient points out that her co-pay for coming to visit [place] i.e. the facility, charge would be unaffordable if they have to, on a weekly basis 2/22; pressure ulcer on the left buttock appears deeper to me and abuts on the ischial tuberosity. I thought initially there was exposed bone but there is a rim of tissue over this area. She also has a superficial over the right anterior mid tibia. Been using silver collagen to these areas without much success. I have looked over the patient's past history with regards to the area on the left ischium. She did have underlying osteomyelitis here dating back I think to late 2020. She saw Dr. Megan Salon she received a 6-week course of oral antibiotics in response to a bone culture that I did. This does not appear to be infected but it certainly has not been improving in terms of granulation. I do not believe she has had any recent imaging studies 3/29; 1 month follow-up. Pressure ulcer on the left buttock which she has been dealing with with for a number  of years. She was treated for underlying osteomyelitis at 1.2 or 3 years ago I think with infectious disease help. She also had I think a flap closure by Dr. Leland Johns and that lasted for about a year and then reopened. I have not been able to  get this patient to progress towards healing although truthfully the wound is absolutely no worse. We have been using Hydrofera Blue 4/26; patient presents for 1 week follow-up. She has been using silver collagen to the area every other day. She has home health that comes out once a week to help with dressing changes as well. The patient is interested in trying a skin substitute over this area. She states she is trying to relieve pressure off of it most of the day. 5/24; patient presents for 1 month follow-up. She has been using silver collagen to the area every other day. She has no complaints today. She is interested in the skin substitute. She tries to leave relieve pressure off Her bottom however is not able to most of the day 6/14; using silver collagen to the area over the left ischial tuberosity. Wound does not appear to be doing particularly well. Open to bone 6/28; the patient patient presented last time with a marked deterioration. Depth probing all the way to bone. The bone itself did not look particularly viable. In spite of this the x-ray I did showed longstanding ulcer over the left ischial tuberosity with chronic bone involvement/reaction that was also seen by CT in 2020. Lab work did not show just active infection with a sed rate of 14 and a C-reactive protein of 7.1. Her comprehensive metabolic panel was normal including an albumin of 4.1 white count was 6 7/19; patient was here 3 weeks ago with a marked deterioration in her wound over the left ischial tuberosity. I ordered a CT scan of the area for 1 reason or another this just did not get done. It is now booked for 8 days from now. She was supposed to come back for bone culture and pathology.  That did not happen either. We have been using silver collagen. As usual she is diligently looked after by her husband 8/24; 5-week follow-up. Since the patient was last here the biopsy that I did of her ischial tuberosity came back suggesting osteomyelitis. Culture showed strep. I started her on Augmentin. She was seen by infectious disease Dr. Lucianne Lei dam and the Augmentin was continued and she is still taking it. CT scan did not suggest anything other than chronic osteomyelitis without much change from her previous study. Her C-reactive protein was only 7 and sedimentation rate was 1.1. We are still using silver collagen to the wound. She has home health. Her husband is very diligent in her care for this reason I have never pursued a diverting colostomy. She would not agree to this surgery in any case. Finally we have discussed plastic surgery with him in the past and she is not interested in a myocutaneous flap. 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 Since she was last here she was in the ER last week with abdominal pain. She was found to be impacted however in the course of the review there somebody gave her some IV morphine and apparently she developed hives and blisters. There is still tense blisters on her plantar left fifth and fourth fingers 10/4; the patient has completed her Augmentin and is due to follow-up with Dr. Drucilla Schmidt in early November. Her wound today measures about the same but looks a little healthier in terms of granulation there is no exposed bone. I note that she was admitted to hospital for 2 days from 9/21 through 9/23 for delirium. She had received Versed for glaucoma surgery ultimately that was felt to be the etiology.  Her blood cultures were negative she had 30,000 colonies of Pseudomonas in her urine. She did receive broad-spectrum antibiotic therapy but ultimately her wound on the buttock was not felt to be the cause. As mentioned she has completed her antibiotics still  using silver collagen 11/1; patient has completed her antibiotics for the underlying osteomyelitis. She apparently follows up with Dr. Drucilla Schmidt next week. The wound does not look too bad perhaps slightly narrower in terms of width but the depth is about the same. We have been using Hydrofera Blue. The patient talks to me about a wound VAC and made it sound as though she was recently on 1 although I do not see this. The other option is an advanced treatment product like Oasis but that means weekly trips into the clinic. She has previously said she does not want an attempt at plastic surgery 11/15; the patient saw Dr. Drucilla Schmidt noted that her follow-up inflammatory markers normalized. She has completed her antibiotics. We are currently using Hydrofera Blue. Wound itself has some surface tissue over bone but certainly not a lot. I have looked back over her history. The patient has had recurrent osteomyelitis in this area as she is received prolonged courses of antibiotics. We did use a wound VAC for a prolonged period of time in 2021. We had some improvement especially in undermining areas but overall not a lot of measurable improvement. Once again I have tried to think about using advanced treatment products in this area something that would require them visiting the clinic very frequently and they did not seem to want to do this. Most recently we ran [place] however she would have a $169 per application co-pay 45/0; left ischial tuberosity. I do not see much difference in 3 weeks. There is no exposed bone. We are using Hydrofera Blue. Our intake nurse reports some "greenish" drainage 12/21 left ischial tuberosity. Measuring slightly smaller in surface area. The wound still has some tissue over bone i.e. there is no exposed bone. No overt infection. We did a deep tissue culture last time for PCR. The major bacteria was Pseudomonas although there were medium titers of Enterococcus faecalis Klebsiella E. coli  and Morganella as well as Peptostreptococcus. Keystone antibiotic include streptomycin and vancomycin they got this last weekend and used it twice 04/17/2021; no change in this wound. It does not have undermining however the surface is of it does not look particularly viable. We have been using topical antibiotic directed at a PCR culture as well as silver alginate. It is clear in talking to the patient and her husband that she does not offload this area adequately including spending all night on this with I think a level 2 surface on her bed. I have told her that this is not adequate to heal a wound like this.. 2/15; Oasis with a $388 co-pay per application. They are using silver alginate to the wound offloading as best they can according to her husband 06/19/2021: At her last visit, Dr. Dellia Nims changed her to silver collagen, but apparently they did not receive this until just last week. They have continued using silver alginate in the wound. Today, the wound is quite malodorous with necrotic slough. They ran out of [place]rock topical about 2 months ago. No new cultures have been taken.. 07/17/2021: At the last visit, the wound was in pretty poor condition with a lot of necrotic slough and substantial malodorous drainage. I did take a new culture for PCR, but for some reason this was  never resulted. Nonetheless, they did receive a renewal of their [employer] apparently it was communicated to them that they should use silver alginate rather than the silver collagen. Regardless, the wound is much cleaner at this visit and there is no odor. 07/26/2021: The sacral/left ischial ulcer is fairly clean, but there was some greenish drainage appreciated on the dressing. They have been using topical Keystone with silver alginate. She has unfortunately developed a new ulcer on her right ischium. It is unstageable with a thick layer of eschar. It is malodorous. 08/14/2021: I took a new PCR culture at her last visit from  the new wound that had opened on her right ischial tuberosity. This returned with a polymicrobial population. A new topical Keystone compound was formulated. They have been using this on her wounds along with silver alginate. I also prescribed a course of oral antibiotic, Augmentin and Levaquin to try and address the multiple species with various resistance these that grew out. She was able to take this for about 10 days, but it has started to make her nauseated and she is actually thrown up on occasion. Since she takes them at the same time, she is not sure which one is causing her issue. She has also been having a lot more drainage from her left ischial wound. Her husband says that he has not been using the zinc oxide as much because it is difficult to wash off when he bathes her. We have been using Santyl to the new wound under silver alginate and silver alginate to the left ischial wound. 09/11/2021: Unfortunately, there has been fairly substantial breakdown of both of her wounds. It sounds like the gel compound that the [employer] was being mixed in just leaked out everywhere and left the patient wet and macerated. There is worsening necrotic tissue and increased depth on the right ischial wound and the left one has extended further. They have not been using the zinc oxide as recommended. They contacted [facility] and were advised to simply apply the powder directly to the wounds. 10/09/2021: The right ischial wound has some necrotic tissue on the lateral aspect and into the base, but no purulent drainage and no surrounding erythema or induration. On the left ischial wound, there is leathery nonviable skin extending laterally from the main wound. No significant odor or drainage from this site, either. 11/05/2021: The right ischial wound has more necrotic fibrinous tissue at the base. The leathery eschar on the left ischial wound is beginning to lift at one of the edges and may be amenable to debridement  today. We have been using topical powdered mupirocin to her wounds with silver alginate. 12/03/2021: Significant improvement in both of the wounds. There is still some fibrinous slough on the right ischial wound. There is also a heavy layer of slough and fibrinous exudate on the left ischial wound as well as some soft light slough on the remainder of the wound surface. We have been using Santyl for enzymatic debridement between clinic visits. Electronic Signature(s) Signed: 12/03/2021 11:28:44 AM By: Fredirick Maudlin MD FACS Entered By: Fredirick Maudlin on 12/03/2021 11:28:44 -------------------------------------------------------------------------------- Physical Exam Details Patient Name: Date of Service: [name]. 12/03/2021 10:30 A M Medical Record Number: [id number] Patient Account Number: [id number] Date of Birth/Sex: Treating RN: [birth year] (71 y.o. [name]: [name] Other Clinician: Referring Provider: Treating Provider/Extender: Burgess Amor Weeks in Treatment: 152 Constitutional . . . . No acute distress.Marland Kitchen Respiratory Normal work of  breathing on room air.. Notes 12/03/2021: Significant improvement in both of the wounds. There is still some fibrinous slough on the right ischial wound. There is also a heavy layer of slough and fibrinous exudate on the left ischial wound as well as some soft light slough on the remainder of the wound surface. Electronic Signature(s) Signed: 12/03/2021 11:30:09 AM By: Fredirick Maudlin MD FACS Entered By: Fredirick Maudlin on 12/03/2021 11:30:09 -------------------------------------------------------------------------------- Physician Orders Details Patient Name: Date of Service: [name]. 12/03/2021 10:30 A M Medical Record Number: [id number] Patient Account Number: [id number] Date of Birth/Sex: Treating RN: 25-Mar-1951 (71 y.o. [name]: [name] Other  Clinician: Referring Provider: Treating Provider/Extender: Nance Pew in Treatment: [phone] Verbal / Phone Orders: No Diagnosis Coding ICD-10 Coding Code Description 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, stage 4 E11.622 Type 2 diabetes mellitus with other skin ulcer L89.313 Pressure ulcer of right buttock, stage 3 Follow-up Appointments Return appointment in 1 month. - Dr. Celine Ahr - Room 1 ****HOYER**** Tuesday 9/26 @ 10:30 am Anesthetic Wound #12 Left Ischial Tuberosity (In clinic) Topical Lidocaine 4% applied to wound bed Wound #15 Right Ischium (In clinic) Topical Lidocaine 4% applied to wound bed Bathing/ Shower/ Hygiene May shower and wash wound with soap and water. - with dressing changes Edema Control - Lymphedema / SCD / Other Elevate legs to the level of the heart or above for 30 minutes daily and/or when sitting, a frequency of: - throughout the day Off-Loading Turn and reposition every 2 hours - *** Try to shift from side to side and shift to belly (if tolerated),****avoid sitting up in bed except for meals Additional Orders / Instructions Follow Nutritious Diet - protein shakes 2-3 times per day, Juven 2 times per day Home Health No change in wound care orders this week; continue Home Health for wound care. May utilize formulary equivalent dressing for wound treatment orders unless otherwise specified. Dressing changes to be completed by Home Health on Monday / Wednesday / Friday except when patient has scheduled visit at [facility]. - increase visits to 3 times per week (spouse having difficulty with dressing changes) Other Home Health Orders/Instructions: - [facility] Wound Treatment Wound #12 - Ischial Tuberosity Wound Laterality: Left Cleanser: Soap and Water 1 x Per Day/30 Days Discharge Instructions: May shower and wash wound with dial antibacterial soap and water prior to dressing change. Cleanser: Wound Cleanser (Generic) 1 x Per  Day/30 Days Discharge Instructions: Cleanse the wound with wound cleanser prior to applying a clean dressing using gauze sponges, not tissue or cotton balls. Peri-Wound Care: Skin Prep (Generic) 1 x Per Day/30 Days Discharge Instructions: Use skin prep as directed Prim Dressing: Santyl Ointment 1 x Per Day/30 Days ary Discharge Instructions: Apply nickel thick amount to wound bed then pack with silver alginate Secondary Dressing: Woven Gauze Sponge, Non-Sterile 4x4 in (Generic) 1 x Per Day/30 Days Discharge Instructions: moisten with saline and pack into wound Secondary Dressing: Zetuvit Plus Silicone Border Dressing 5x5 (in/in) (Generic) 1 x Per Day/30 Days Discharge Instructions: Apply silicone border or ABD padover primary dressing as directed. Wound #15 - Ischium Wound Laterality: Right Cleanser: Soap and Water 1 x Per Day/30 Days Discharge Instructions: May shower and wash wound with dial antibacterial soap and water prior to dressing change. Cleanser: Wound Cleanser (Generic) 1 x Per Day/30 Days Discharge Instructions: Cleanse the wound with wound cleanser prior to applying a clean dressing using gauze sponges, not  tissue or cotton balls. Peri-Wound Care: Skin Prep (Generic) 1 x Per Day/30 Days Discharge Instructions: Use skin prep as directed Prim Dressing: Santyl Ointment 1 x Per Day/30 Days ary Discharge Instructions: Apply nickel thick amount to wound bed then pack with silver alginate Secondary Dressing: Woven Gauze Sponge, Non-Sterile 4x4 in (Generic) 1 x Per Day/30 Days Discharge Instructions: moisten with saline and pack into wound Secondary Dressing: Zetuvit Plus Silicone Border Dressing 5x5 (in/in) (Generic) 1 x Per Day/30 Days Discharge Instructions: Apply silicone border or ABD padover primary dressing as directed. Electronic Signature(s) Signed: 12/03/2021 12:03:01 PM By: Fredirick Maudlin MD FACS Signed: 12/03/2021 6:08:33 PM By: Baruch Gouty RN, BSN Entered By:  Baruch Gouty on 12/03/2021 11:40:05 -------------------------------------------------------------------------------- Problem List Details Patient Name: Date of Service: [name]. 12/03/2021 10:30 A M Medical Record Number: [id number] Patient Account Number: [id number] Date of Birth/Sex: Treating RN: Sep 15, 1950 (71 y.o. [name]: [name] Other Clinician: Referring Provider: Treating Provider/Extender: Burgess Amor Weeks in Treatment: 152 Active Problems ICD-10 Encounter Code Description Active Date MDM Diagnosis 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, stage 4 12/30/2018 No Yes E11.622 Type 2 diabetes mellitus with other skin ulcer 12/30/2018 No Yes L89.313 Pressure ulcer of right buttock, stage 3 07/26/2021 No Yes Inactive Problems ICD-10 Code Description Active Date Inactive Date G82.20 Paraplegia, unspecified 12/30/2018 12/30/2018 M86.68 Other chronic osteomyelitis, other site 02/17/2019 02/17/2019 S80.811D Abrasion, right lower leg, subsequent encounter 03/27/2020 03/27/2020 M86.68 Other chronic osteomyelitis, other site 11/28/2020 11/28/2020 L97.811 Non-pressure chronic ulcer of other part of right lower leg limited to breakdown of skin 03/27/2020 03/27/2020 Resolved Problems Electronic Signature(s) Signed: 12/03/2021 11:25:21 AM By: Fredirick Maudlin MD FACS Entered By: Fredirick Maudlin on 12/03/2021 11:25:21 -------------------------------------------------------------------------------- Progress Note Details Patient Name: Date of Service: [name]. 12/03/2021 10:30 A M Medical Record Number: [id number] Patient Account Number: [id number] Date of Birth/Sex: Treating RN: 03/11/1951 (71 y.o. [name], [name] Primary Care Provider: [name] Other Clinician: Referring Provider: Treating Provider/Extender: Nance Pew in Treatment: 152 Subjective Chief Complaint Information obtained from  Patient She is here for follow up evaluation of left ischial pressure ulcer 12/30/2018; patient comes back for review of wounds in the same general area over the previously healed left ischial pressure ulcer History of Present Illness (HPI) The following HPI elements were documented for the patient's wound: Location: open ulceration of the left gluteal area, left heel and right ankle for about 5 months. Quality: Patient reports No Pain. Severity: Patient states wound(s) are getting worse. Duration: Patient has had the wound for > 5 months prior to seeking treatment at the wound center Context: The wound occurred when the patient has been paraplegic for about 3 years. Modifying Factors: Wound improving due to current treatment. Associated Signs and Symptoms: Patient reports having foul odor. this 71 year old patient who is known to have hypertension, hypothyroidism, breast cancer, chronic pain syndrome, paraplegia was noted to have a left gluteal decubitus ulcer and was brought into the hospital. During the course of her hospitalization she was debrided in the operating room by ankle wound. Bone cultures were taken at that time but were negative but clinically she was treated for osteomyelitis because of the probing down to bone and open exposed bone. Home health has been giving her antibioticss which include vancomycin and Zosyn. The patient was a smoker until about 3 weeks ago and used to smoke about 10 cigarettes a day for a long while.  12/13/2014 - details of her operative note from 11/03/2014 were reviewed -- PROCEDURE: 1. Excisional debridement skin, subcutaneous, muscle left ischium 35 cm2 2. Excisional debridement skin, subcutaneous tissue left heel 27 cm2 3. Excisional debridement right ankle skin, subcutaneous, bone 30 cm2 01/24/2015 -- she has some issues with her wheelchair cushion but other than that is doing very well and has received Podus boots for her feet. 02/14/2015 -- she was  using her old offloading boots and this seemed to have caused her a new pressure ulcer on the left posterior heel near the superior part just below the Achilles tendon. 03/07/2015 -- she has a new ulceration just to the left of the midline on her sacral region more on the left buttock and this has been there for Dr. Leland Johns and had all the wounds sharply debrided. The debridement was done for the left ischial wound, the left heel wound and the right about a week. 08/22/2015 -- was recently admitted to hospital between May 5 and 08/13/2015, with sepsis and leukocytosis due to a UTI. she was treated for a sepsis complicating Escherichia coli UTI and kidney stones. She also had metabolic and careful up at the secondary to pyelonephritis. He received broad-spectrum antibiotics initially and then received Macrobid as per urology. She was sent home on nitrofurantoin. during her admission she had a CT scan which showed exposed left ischial tuberosity without evidence of osteolysis. 09/12/2015-- the patient is having some issues with her air mattress and would like to get a opinion from medical modalities. 10/10/2015 -- the issue with her air mattress has not yet been sorted out and the new problem seems to be a lot of odor from the wound VAC. 11/27/2015 -- the patient was admitted to the hospital between July 23 and 10/31/2015. Her problems were sepsis, osteomyelitis of the pelvic bone and acute pyelonephritis. CT of the abdomen and pelvis was consistent with a left-sided pyelonephritis with hydronephrosis and also just showed new sclerosis of the posterior portion of the left anterior pubic ramus suggestive of periosteal reaction consistent with osteomyelitis. She was treated for the osteomyelitis with infectious disease consult recommending 6 weeks of IV antibiotics including vancomycin and Rocephin and the antibiotics were to go on until 12/10/2015. He was seen by Dr. Iran Planas plastic surgery and Dr.  Linus Salmons of infectious disease. She had a suprapubic catheter placed during the admission. CT scan done on 10/28/2015 showed specifically -- New sclerosis of the posterior portion of the left inferior pubic ramus with aggressive periosteal reaction, consistent with osteomyelitis, with adjacent soft tissue gas compatible with previously described decubitus ulcer. 12/12/2015 -- she was recently seen by Dr. Linus Salmons, who noted good improvement and CRP and ESR compared to before and he has stopped her antibiotic as per plans to finish on September 4. The patient was encouraged to continue with wound care and consider hyperbaric oxygen therapy. Today she tells me that she has consented to undergo hyperbaric oxygen therapy and we can start the paperwork. 01/02/2016 -- her PCP had gained about 3 years but she still persists in having problems during hyperbaric oxygen therapy with some discomfort in the ears. 01/09/16; pressure area with underlying osteomyelitis in the left buttock. Wound bed itself has some slight amount of grayish surface slough however I do not think any debridement was necessary. There is no exposed bone soft tissue appears stable. She is using a wound VAC 01/16/16; back for weekly wound review in conjunction with HBO. She has a deep wound over  the left initial tuberosity previously treated with 6 weeks of IV antibiotics for osteomyelitis. Wound bed looks reasonably healthy although the base of this is still precariously close to bone. She has been using a wound VAC. 01/23/2016 -- she has completed her course of antibiotics and this week the only new thing is her right great toe nail was avulsed and she has got an open wound over the nailbed. 01/31/16 she has completed her course of antibiotics. Her right great toenail avulsed last week and she's been using silver alginate for this as well. 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 Did not sound like there was any optimistic discussion regarding reconstruction and the net result would be to continue with the wound VAC application. I will await the official reports. Addendum: -- she was seen at [facility] by Dr. Tressa Busman. After a thorough review and from what I understand spending 45 minutes with the patient his assessment has been noted by me in detail and the management options were: 1. Continued pressure offloading and wound care versus operative procedures including wound excision 2. Soft tissue and bone sampling 3. If the wound gets larger wound closure would be done using a variety of plastic surgical techniques including but not limited to skin substitute, possible skin graft, local versus regional flaps, negative pressure dressing application. 4. He discussed with her details of flap surgery and the risks associated 5. He made a comment that since the patient was operated on by Dr. Leland Johns of [facility] plastic surgery unit in [place] the patient may continue to follow-up there for further evaluation for surgical flap closure in the future. 03/19/2016 -- the patient continues to be rather depressed and frustrated with her lack of rapid progress in healing this wound especially because she thought after hyperbaric oxygen therapy the wound would heal extremely fast. She now understands that was not the implied benefit on wound care which was the recommendation for hyperbaric oxygen therapy. I have had a lengthy discussion with the patient and her husband regarding her options: 1. Continue with collagen and wound VAC for the primary dressing and offloading and all supportive care. 2. See Dr. Iran Planas for possible placement of Acell or Integra in  the OR. 3. get a second opinion from a wound care center and surrounding regions/counties 05/07/2016 -- Note from Dr. Celedonio Miyamoto, who noted that the patient has declined flap surgery. She has discussed application of A cell, and try a few applications to see how the wound progresses. She is also recommended that we could apply products here in the wound center, like Oasis. during her preop workup it was found that her hemoglobin A1c was 11% and she has now been diagnosed as having diabetes mellitus and has been put on appropriate treatment by her PCP 05/28/2016 -- tells me her blood sugars have been doing well and she has an appointment to see her PCP in the next couple of weeks to check her hemoglobin A1c. Other than that she continues to do well. 06/25/2016 -- have not seen her back for the last month but she says her health has been about the same and she has an appointment to check the A1c next  week 09/10/16 ---- was seen by Dr. Celedonio Miyamoto -- who applied Acell and saw her back in follow-up. She has recommended silver alginate to the wound every other day and cover with foam. If no significant drainage could transition to collagen every other day. She recommended discontinuing wound VAC. There were no plans to repeat application of Acell. The patient expressed that her husband could do the wound care as going to the Wound Ctr[facility] would cost several $100 for each visit. 10/21/2016 -- her insurance company is getting her new mattress and she is pleased about that. Other than that she has been doing dressings with PolyMem Silver and has been doing very well 02/18/2017 -- she has gone through several changes of her mattress and has not been pleased with any of them. The ventricles are still working on trying to fit her with the appropriate low air-loss mattress. She has a new wound on the gluteal area which is clearly separated from the original wound. 03/25/17-she is here in follow-up  evaluation for her left ischialpressure ulcer. She remains unsatisfied with her pressure mattress. She admits to sitting multiple hours a day, in the bed. We have discussed offloading options. The wound does not appear infected. Nutrition does not appear to be a concern. Will follow-up in 4 weeks, if wound continues to be stalled may consider x-ray to evaluate for refractory osteomyelitis. 04/21/17; this is a patient that I don't know all that well. She has a chronic wound which at one point had underlying osteomyelitis in the left ischial tuberosity. This is a stage IV pressure ulcer. Over the last 3 months she has a stage II wound inferiorly to the original wound. The last time she was here her dressing was changed to silver collagen although the patient's husband who changes the dressing said that the collagen stuck to the wound and remove skin from the superficial area therefore he switched back to [place] 05/13/17; this is a patient we've been following for a left ischial tuberosity wound which was stage IV at one point had underlying osteomyelitis. Over the last several months she's had a stage II wound just inferior and medial to the related to the wound. According to her husband he is using Endoform layer with collagen although this is not what I had last time. According to her husband they are using Elgie [place] with collagen although I don't quite know how that started. She was hospitalized from 1/20 through 04/30/16. This was related to a UTI. Her blood cultures were negative, urine culture showed multiple species. She did have a CT scan of the abdomen and pelvis which documented chronic osteomyelitis in the area of the wound inflammatory markers were unremarkable. She has had prior knowledge of osteomyelitis. It looks as though she received IV antibiotics in 2017 and was treated with a course of hyperbaric oxygen. 05/28/17; the wound over the left ischial tuberosity is deeper today and  abuts clearly on bone. Nursing intake reported drainage. I therefore culture of the wound. The more superficial area just below this looks about the same. They once again complained that there are mattress cover is not working although apparently [facility] is been noted to see this many times in the report is that the device is functional 06/18/17; the patient had a probing area on the left ischial tuberosity that was draining purulent fluid last time. This also clearly seemed to have open bone. Culture I did showed pansensitive pseudomonas including third generation cephalosporins. I treated  this with cefdinir 300 twice a day for 10 days and things seem to have improved. She has a more superficial wound just underneath this area. Amazingly she has a new air mattress through [facility]. I think they gave this to her as a parking give. In any case this now works according to the patient may have something to do with why the areas are looking better. 07/09/17; the patient has a probing area in the left ischial tuberosity that still has some depth. However this is contracted in terms of the wound orifice although the depth is still roughly the same. There is no undermining. ooShe also has the satellite wound which is more superficial. This appears to have a healthy surface we've been using silver collagen 08/06/17; the patient's wound is over the left ischial tuberosity and a satellite lesion just underneath this. The original wound was actually a deep stage 4 wound. We have made good progress in 2 months and there is no longer exposed bone here. 09/03/17; left ischial tuberosity actually appears to be quite healthy. I think we are making progress. No debridement is required. There is no surrounding erythema 10/01/17 I follow this patient monthly for her left ischial tuberosity wound. There is 2 areas the original area and a satellite area. The satellite area looks a lot better there is no  surrounding erythema. Her husband relates that he is having trouble maintaining the dressing. This has to do with the soft tissue around it. He states he puts the collagen in but he cannot make sure that it stays in even with the ABD pads and tape that he is been using 10/29/17; patient arrives with a better looking noon today. Some of the satellite lesions have closed. using Prisma 11/26/17; the patient has a large cone-shaped area with the tip of the Cone deep within her buttock soft tissue. The walls of the Cone are epithelialized however the base is still open. The area at the base of this looks moist we've been using silver collagen. Will change to silver alginate 12/31/2017; the wound appears to have come in fairly nicely. Using silver alginate. There is no surrounding maceration or infection 01/28/18; there is still an open area here over the left initial tuberosity. Base of this however looks healthy. There is no surrounding infection 02/25/18; the area of its open is over the left ischial tuberosity. The base of this is where the wound is. This is a large inverted cone-shaped area with the wound at the tip. Dimensions of the wound at the tip are improved. There is a area of denuded skin about halfway towards the tip which her husband thinks may have happened today when he was bathing her. 04/20/17; the area is still open over the left initial tuberosity. 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 This is now completely closed over although it is closed over with a divot and skin over bone at the base of this. Continued aggressive offloading will be necessary. 12/30/2018 READMISSION This is a 71 year old woman with chronic paraplegia. I picked her up for her care from Dr. Con Memos in this clinic after he  departed. She had a stage IV pressure wound over the left ischial tuberosity. She was treated twice for her underlying osteomyelitis and this I believe firstly  in 2016 and again in 2017. There were some plans at some point for flap closure of this however she was discovered to have uncontrolled diabetes and I do not think this was ever accomplished. She ultimately healed over in this clinic and was discharged in May. She has a large cone-shaped indentation with the tip of this going towards the left ischial tuberosity. It is not an easy area to examine but at that time I thought all of this was epithelialized. Apparently there was a reopening here shortly after she left the clinic last time. She was admitted to hospital at the end of June for Klebsiella bacteremia felt to be secondary to UTI. A CT scan of the pelvis is listed below and there was initially some concern that she had underlying osteomyelitis although I believe she was seen by infectious disease and that was felt to be not the case: I do not see any new cultures or inflammatory markers IMPRESSION: 1. No CT evidence for acute intra-abdominal or pelvic abnormality. Large volume of stool throughout the colon. 2. Enlarged fatty liver with fat sparing near the gallbladder fossa 3. Cortical scarring right kidney. Bilateral intrarenal stones without hydronephrosis. Thick-walled urinary bladder decompressed by suprapubic catheter 4. Deep left decubitus ulcer with underlying left ischial changes suggesting osteomyelitis. Her husband has been using silver collagen in the wound. She has not been systemically unwell no fever chills eating and drinking well. They rigorously offload this wound only getting up in the wheelchair when she is going to appointments the rest of the time she is in bed. 10/8; wound measures larger and she now has exposed bone. We have been using silver alginate 11/12 still using silver alginate. The patient saw Dr.  Megan Salon of infectious disease. She was started on Augmentin 500 mg twice daily. She is due to follow-up with Dr. Megan Salon I believe next week. Lab work Dr. Megan Salon requested showed a sedimentation rate of 28 and CRP of 20 although her CRP 1 year ago was 18.8. Sedimentation rate 1 year ago was 11 basic metabolic panel showed a creatinine of 1.12 12/3; the patient followed up with Dr. Megan Salon yesterday. She is still on Augmentin twice daily. This was directed by Dr. Megan Salon. The patient's inflammatory markers have improved which is gratifying. Her C-reactive protein was repeated yesterday and follow-up booked with infectious disease in January. In addition I have been getting secure text messages I think from palliative care [facility] [employer]. I think they were hoping to provide services to the patient in her home. They could not get a hold of the primary physician and so they reached out to me on 2 separate occasions. 12/17; patient last saw Dr. Megan Salon on 12/2. She is finishing up with Augmentin. Her C-reactive protein was 20 on 10/21, 10.1 on 11/19 and 17 on 12/2. The wound itself still has depth and undermining. We are using Santyl with the backing wet-to-dry 04/27/2019. The wound is gradually clearing up in terms of the surface although it is not filled in that much. Still abuts right against bone 2/4; patient with a deep pressure ulcer over the left ischial tuberosity. I thought she was going to follow-up with infectious disease to follow her inflammatory markers although the patient states that they stated that they did not need to see her unless we felt it was necessary. I will need to check their notes. In any case we ordered moistened silver collagen back with wet-to-dry to fill in the  depth of the wound although apparently [facility] sent silver alginate which they have been using since they were here the last time. Is obviously not what we ordered. 2/25. Not much  change in this wound it is over the left ischial tuberosity recurrent wound. We have been using silver collagen with backing wet-to-dry. I think the wound is about the same. There is still some tunneling from about 10-12 o'clock over the ischial tuberosity itself 3/11; pressure ulcer over the left ischial tuberosity. Since she was last here the wound VAC was started and apparently going quite well. We are able to get the home health company that accepts [place] healthcare which is in itself sometimes problematic. There is been improvements in the wound the tunneling seems to be better and is contracted nicely 4/8; 1 month follow-up. Since she was last here we have been using silver collagen under a wound VAC. Some minor contraction I think in wound volume. She is cared for diligently by her husband including pressure relief, incontinence management, nutritional support etc. 6/1; this is almost a 49-monthfollow-up. She is been using silver collagen under wound VAC. Circular area over the left ischial tuberosity. She has been using silver collagen under wound VAC 7/8; 1 month follow-up. Silver collagen under the VAC not really a lot of progress. Tissue at the base of the wound which is right against bone and the tissue next that this does not look completely viable. She is not currently on any antibiotics, she had underlying osteomyelitis I need to look this over 8/16; we are using silver collagen under wound VAC to the left ischial tuberosity wound. Comes in today with absolutely no change in surface area or depth. There is no exposed bone. I did look over her infectious disease notes as I said I would do last time. She last saw Dr. CMegan Salonin December 2020. She completed 6 weeks of Augmentin. This was in response to a bone culture I did showing methicillin susceptible staph aureus and Enterococcus. She was supposed to come back to see Dr. CMegan Salonat some point although they say that that appointment  was canceled unless I chose to recommend return. I think there was supposed to be follow-up with inflammatory markers but I cannot see that that was ever done. She has not been on antibiotics since 9/21; monthly follow-up. We received a call from home health nurse last evening to report green drainage coming out of the wound. Lab work I ordered last time showed a white count of 5.2 a sedimentation rate of 45 and a C-reactive protein of 25 however neither one of the 2 values are substantially different from her previous values in October 2020 or December 2020. Both are slightly higher but only marginally. Otherwise no new complaints from the patient or her husband 10/19; 1 month follow-up. PCR culture I did last time showed medium quantities of Pseudomonas lower quantities Klebsiella and Enterococcus faecalis group B strep and Peptostreptococcus. I gave her Augmentin for 2 weeks. I am not really sure of my choice of this I would not cover Pseudomonas. She is still having green drainage. Wound itself looks satisfactory there is not a lot of depth wound bed looks healthy 11/16; patient has completed the antibiotics still using gentamicin and silver alginate on the wound. There is improvement in the surface area 12/21; in general the area on the buttock looks somewhat better. Surface looks healthy although I do not know that there is been much improvement in the wound  volume. We have been using silver alginate and Hydrofera Blue. Less drainage. In passing the husband showed me an abrasion injury on the left anterior tibia. Covered in necrotic surface. He has noticed this for about a week and has been putting silver collagen on it. He is completely uncertain about how this happened 1/25; monthly follow-up. The area on the left buttock is about the same. This does not go to bone but a fairly deep wound surface of the wound is of questionable viability. The abrasion injury that they showed me last time  apparently was closed out by home health because they thought it was healed but certainly is not although it is just about healed. As a result they haven't been applying anything to this area Finally I did discuss with the patient and her husband the idea of an advanced treatment product to try and get a proper base to this wound I was thinking of Puraply however actually the patient points out that her co-pay for coming to visit [place] i.e. the facility, charge would be unaffordable if they have to, on a weekly basis 2/22; pressure ulcer on the left buttock appears deeper to me and abuts on the ischial tuberosity. I thought initially there was exposed bone but there is a rim of tissue over this area. She also has a superficial over the right anterior mid tibia. Been using silver collagen to these areas without much success. I have looked over the patient's past history with regards to the area on the left ischium. She did have underlying osteomyelitis here dating back I think to late 2020. She saw Dr. Megan Salon she received a 6-week course of oral antibiotics in response to a bone culture that I did. This does not appear to be infected but it certainly has not been improving in terms of granulation. I do not believe she has had any recent imaging studies 3/29; 1 month follow-up. Pressure ulcer on the left buttock which she has been dealing with with for a number of years. She was treated for underlying osteomyelitis at 1.2 or 3 years ago I think with infectious disease help. She also had I think a flap closure by Dr. Leland Johns and that lasted for about a year and then reopened. I have not been able to get this patient to progress towards healing although truthfully the wound is absolutely no worse. We have been using Hydrofera Blue 4/26; patient presents for 1 week follow-up. She has been using silver collagen to the area every other day. She has home health that comes out once a week to help with dressing  changes as well. The patient is interested in trying a skin substitute over this area. She states she is trying to relieve pressure off of it most of the day. 5/24; patient presents for 1 month follow-up. She has been using silver collagen to the area every other day. She has no complaints today. She is interested in the skin substitute. She tries to leave relieve pressure off Her bottom however is not able to most of the day 6/14; using silver collagen to the area over the left ischial tuberosity. Wound does not appear to be doing particularly well. Open to bone 6/28; the patient patient presented last time with a marked deterioration. Depth probing all the way to bone. The bone itself did not look particularly viable. In spite of this the x-ray I did showed longstanding ulcer over the left ischial tuberosity with chronic bone involvement/reaction that was also  seen by CT in 2020. Lab work did not show just active infection with a sed rate of 14 and a C-reactive protein of 7.1. Her comprehensive metabolic panel was normal including an albumin of 4.1 white count was 6 7/19; patient was here 3 weeks ago with a marked deterioration in her wound over the left ischial tuberosity. I ordered a CT scan of the area for 1 reason or another this just did not get done. It is now booked for 8 days from now. She was supposed to come back for bone culture and pathology. That did not happen either. We have been using silver collagen. As usual she is diligently looked after by her husband 8/24; 5-week follow-up. Since the patient was last here the biopsy that I did of her ischial tuberosity came back suggesting osteomyelitis. Culture showed strep. I started her on Augmentin. She was seen by infectious disease Dr. Lucianne Lei dam and the Augmentin was continued and she is still taking it. CT scan did not suggest anything other than chronic osteomyelitis without much change from her previous study. Her C-reactive protein was  only 7 and sedimentation rate was 1.1. We are still using silver collagen to the wound. She has home health. Her husband is very diligent in her care for this reason I have never pursued a diverting colostomy. She would not agree to this surgery in any case. Finally we have discussed plastic surgery with him in the past and she is not interested in a myocutaneous flap. 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 Since she was last here she was in the ER last week with abdominal pain. She was found to be impacted however in the course of the review there somebody gave her some IV morphine and apparently she developed hives and blisters. There is still tense blisters on her plantar left fifth and fourth fingers 10/4; the patient has completed her Augmentin and is due to follow-up with Dr. Drucilla Schmidt in early November. Her wound today measures about the same but looks a little healthier in terms of granulation there is no exposed bone. I note that she was admitted to hospital for 2 days from 9/21 through 9/23 for delirium. She had received Versed for glaucoma surgery ultimately that was felt to be the etiology. Her blood cultures were negative she had 30,000 colonies of Pseudomonas in her urine. She did receive broad-spectrum antibiotic therapy but ultimately her wound on the buttock was not felt to be the cause. As mentioned she has completed her antibiotics still using silver collagen 11/1; patient has completed her antibiotics for the underlying osteomyelitis. She apparently follows up with Dr. Drucilla Schmidt next week. The wound does not look too bad perhaps slightly narrower in terms of width but the depth is about the same. We have been using Hydrofera Blue. The patient talks to me about a wound VAC and made it sound as though she was recently on 1 although I do not see this. The other option is an advanced treatment product like Oasis but that means weekly trips into the clinic. She has previously said she  does not want an attempt at plastic surgery 11/15; the patient saw Dr. Drucilla Schmidt noted that her follow-up inflammatory markers normalized. She has completed her antibiotics. We are currently using Hydrofera Blue. Wound itself has some surface tissue over bone but certainly not a lot. I have looked back over her history. The patient has had recurrent osteomyelitis in this area as she is received  prolonged courses of antibiotics. We did use a wound VAC for a prolonged period of time in 2021. We had some improvement especially in undermining areas but overall not a lot of measurable improvement. Once again I have tried to think about using advanced treatment products in this area something that would require them visiting the clinic very frequently and they did not seem to want to do this. Most recently we ran [facility] however she would have a $323 per application co-pay 55/7; left ischial tuberosity. I do not see much difference in 3 weeks. There is no exposed bone. We are using Hydrofera Blue. Our intake nurse reports some "greenish" drainage 12/21 left ischial tuberosity. Measuring slightly smaller in surface area. The wound still has some tissue over bone i.e. there is no exposed bone. No overt infection. We did a deep tissue culture last time for PCR. The major bacteria was Pseudomonas although there were medium titers of Enterococcus faecalis Klebsiella E. coli and Morganella as well as Peptostreptococcus. Keystone antibiotic include streptomycin and vancomycin they got this last weekend and used it twice 04/17/2021; no change in this wound. It does not have undermining however the surface is of it does not look particularly viable. We have been using topical antibiotic directed at a PCR culture as well as silver alginate. It is clear in talking to the patient and her husband that she does not offload this area adequately including spending all night on this with I think a level 2 surface on her bed. I  have told her that this is not adequate to heal a wound like this.. 2/15; Oasis with a $322 co-pay per application. They are using silver alginate to the wound offloading as best they can according to her husband 06/19/2021: At her last visit, Dr. Dellia Nims changed her to silver collagen, but apparently they did not receive this until just last week. They have continued using silver alginate in the wound. Today, the wound is quite malodorous with necrotic slough. They ran out of Pace topical about 2 months ago. No new cultures have been taken.. 07/17/2021: At the last visit, the wound was in pretty poor condition with a lot of necrotic slough and substantial malodorous drainage. I did take a new culture for PCR, but for some reason this was never resulted. Nonetheless, they did receive a renewal of their [employer] apparently it was communicated to them that they should use silver alginate rather than the silver collagen. Regardless, the wound is much cleaner at this visit and there is no odor. 07/26/2021: The sacral/left ischial ulcer is fairly clean, but there was some greenish drainage appreciated on the dressing. They have been using topical Keystone with silver alginate. She has unfortunately developed a new ulcer on her right ischium. It is unstageable with a thick layer of eschar. It is malodorous. 08/14/2021: I took a new PCR culture at her last visit from the new wound that had opened on her right ischial tuberosity. This returned with a polymicrobial population. A new topical Keystone compound was formulated. They have been using this on her wounds along with silver alginate. I also prescribed a course of oral antibiotic, Augmentin and Levaquin to try and address the multiple species with various resistance these that grew out. She was able to take this for about 10 days, but it has started to make her nauseated and she is actually thrown up on occasion. Since she takes them at the same time, she  is not sure which one  is causing her issue. She has also been having a lot more drainage from her left ischial wound. Her husband says that he has not been using the zinc oxide as much because it is difficult to wash off when he bathes her. We have been using Santyl to the new wound under silver alginate and silver alginate to the left ischial wound. 09/11/2021: Unfortunately, there has been fairly substantial breakdown of both of her wounds. It sounds like the gel compound that the [employer] was being mixed in just leaked out everywhere and left the patient wet and macerated. There is worsening necrotic tissue and increased depth on the right ischial wound and the left one has extended further. They have not been using the zinc oxide as recommended. They contacted [facility] and were advised to simply apply the powder directly to the wounds. 10/09/2021: The right ischial wound has some necrotic tissue on the lateral aspect and into the base, but no purulent drainage and no surrounding erythema or induration. On the left ischial wound, there is leathery nonviable skin extending laterally from the main wound. No significant odor or drainage from this site, either. 11/05/2021: The right ischial wound has more necrotic fibrinous tissue at the base. The leathery eschar on the left ischial wound is beginning to lift at one of the edges and may be amenable to debridement today. We have been using topical powdered mupirocin to her wounds with silver alginate. 12/03/2021: Significant improvement in both of the wounds. There is still some fibrinous slough on the right ischial wound. There is also a heavy layer of slough and fibrinous exudate on the left ischial wound as well as some soft light slough on the remainder of the wound surface. We have been using Santyl for enzymatic debridement between clinic visits. Patient History Information obtained from Patient. Family History Unknown History, Cancer - Mother,  Diabetes - Maternal Grandparents, Heart Disease - Mother, Hypertension - Mother, Thyroid Problems - Mother, No family history of Hereditary Spherocytosis, Kidney Disease, Lung Disease, Seizures, Stroke, Tuberculosis. Social History Former smoker - ended on 11/06/2014, Marital Status - Married, Alcohol Use - Never, Drug Use - No History, Caffeine Use - Daily - T soda. ea, Medical History Eyes Denies history of Cataracts, Glaucoma, Optic Neuritis Ear/Nose/Mouth/Throat Denies history of Chronic sinus problems/congestion, Middle ear problems Hematologic/Lymphatic Patient has history of Anemia Denies history of Hemophilia, Human Immunodeficiency Virus, Lymphedema, Sickle Cell Disease Respiratory Denies history of Aspiration, Asthma, Chronic Obstructive Pulmonary Disease (COPD), Pneumothorax, Sleep Apnea, Tuberculosis Cardiovascular Patient has history of Hypertension Denies history of Angina, Arrhythmia, Congestive Heart Failure, Coronary Artery Disease, Deep Vein Thrombosis, Hypotension, Myocardial Infarction, Peripheral Arterial Disease, Peripheral Venous Disease, Phlebitis, Vasculitis Gastrointestinal Denies history of Cirrhosis , Colitis, Crohnoos, Hepatitis A, Hepatitis B, Hepatitis C Endocrine Patient has history of Type II Diabetes Denies history of Type I Diabetes Genitourinary Denies history of End Stage Renal Disease Immunological Denies history of Lupus Erythematosus, Raynaudoos, Scleroderma Integumentary (Skin) Denies history of History of Burn Musculoskeletal Patient has history of Osteoarthritis Denies history of Gout, Rheumatoid Arthritis, Osteomyelitis Neurologic Patient has history of Dementia, Paraplegia Denies history of Neuropathy, Quadriplegia Oncologic Patient has history of Received Radiation - 2012 - right breast Psychiatric Denies history of Anorexia/bulimia, Confinement Anxiety Hospitalization/Surgery History - left knee replacement. - right breast  lumpectomy. - cervical laminectomy with fusion. - hysterectomy. - tear duct surgery. - T and A. - UTI. - suprapubic cath placement. - UTI. Medical A Surgical History Notes nd Gastrointestinal constipation GERD  Genitourinary UTI kidney stones neurogenic bladder Integumentary (Skin) left gluteal fold sacral Musculoskeletal cervical neuropathy osteomyelitis Psychiatric admitted for suicide risk on 12/02/2018 Objective Constitutional No acute distress.. Vitals Time Taken: 10:45 AM, Height: 63 in, Weight: 185 lbs, BMI: 32.8, Temperature: 97.5 F, Pulse: 85 bpm, Respiratory Rate: 18 breaths/min, Blood Pressure: 138/71 mmHg. Respiratory Normal work of breathing on room air.. General Notes: 12/03/2021: Significant improvement in both of the wounds. There is still some fibrinous slough on the right ischial wound. There is also a heavy layer of slough and fibrinous exudate on the left ischial wound as well as some soft light slough on the remainder of the wound surface. Integumentary (Hair, Skin) Wound #12 status is Open. Original cause of wound was Pressure Injury. The date acquired was: 09/06/2018. The wound has been in treatment 152 weeks. The wound is located on the Left Ischial Tuberosity. The wound measures 3cm length x 8.3cm width x 1.6cm depth; 19.556cm^2 area and 31.29cm^3 volume. There is Fat Layer (Subcutaneous Tissue) exposed. There is no tunneling noted, however, there is undermining starting at 12:00 and ending at 6:00 with a maximum distance of 1.7cm. There is a large amount of serosanguineous drainage noted. The wound margin is well defined and not attached to the wound base. There is medium (34-66%) red, pink granulation within the wound bed. There is a medium (34-66%) amount of necrotic tissue within the wound bed including Adherent Slough. Wound #15 status is Open. Original cause of wound was Pressure Injury. The date acquired was: 07/21/2021. The wound has been in treatment 18  weeks. The wound is located on the Right Ischium. The wound measures 2cm length x 1.7cm width x 1cm depth; 2.67cm^2 area and 2.67cm^3 volume. There is Fat Layer (Subcutaneous Tissue) exposed. There is no tunneling noted, however, there is undermining starting at 12:00 and ending at 5:00 with a maximum distance of 0.4cm. There is a medium amount of serosanguineous drainage noted. The wound margin is epibole. There is medium (34-66%) red granulation within the wound bed. There is a medium (34-66%) amount of necrotic tissue within the wound bed including Adherent Slough. Assessment Active Problems ICD-10 Pressure ulcer of left buttock, stage 4 Type 2 diabetes mellitus with other skin ulcer Pressure ulcer of right buttock, stage 3 Procedures Wound #12 Pre-procedure diagnosis of Wound #12 is a Pressure Ulcer located on the Left Ischial Tuberosity . There was a Excisional Skin/Subcutaneous Tissue Debridement with a total area of 24 sq cm performed by Fredirick Maudlin, MD. With the following instrument(s): Curette to remove Viable and Non-Viable tissue/material. Material removed includes Subcutaneous Tissue and Slough and after achieving pain control using Lidocaine 4% T opical Solution. No specimens were taken. A time out was conducted at 11:15, prior to the start of the procedure. A Minimum amount of bleeding was controlled with Pressure. The procedure was tolerated well with a pain level of Insensate throughout and a pain level of Insensate following the procedure. Post Debridement Measurements: 3cm length x 8.3cm width x 1.6cm depth; 31.29cm^3 volume. Post debridement Stage noted as Category/Stage IV. Character of Wound/Ulcer Post Debridement is improved. Post procedure Diagnosis Wound #12: Same as Pre-Procedure Wound #15 Pre-procedure diagnosis of Wound #15 is a Pressure Ulcer located on the Right Ischium . There was a Excisional Skin/Subcutaneous Tissue Debridement with a total area of 3.4 sq  cm performed by Fredirick Maudlin, MD. With the following instrument(s): Curette to remove Viable and Non-Viable tissue/material. Material removed includes Subcutaneous Tissue and Slough and after achieving  pain control using Lidocaine 4% T opical Solution. No specimens were taken. A time out was conducted at 11:15, prior to the start of the procedure. A Minimum amount of bleeding was controlled with Pressure. The procedure was tolerated well with a pain level of Insensate throughout and a pain level of Insensate following the procedure. Post Debridement Measurements: 2cm length x 1.7cm width x 1cm depth; 2.67cm^3 volume. Post debridement Stage noted as Category/Stage III. Character of Wound/Ulcer Post Debridement is improved. Post procedure Diagnosis Wound #15: Same as Pre-Procedure Plan Follow-up Appointments: Return appointment in 1 month. - Dr. Celine Ahr - Room 1 Tuesday 9/26 @ 10:30 am Anesthetic: Wound #12 Left Ischial Tuberosity: (In clinic) Topical Lidocaine 4% applied to wound bed Wound #15 Right Ischium: (In clinic) Topical Lidocaine 4% applied to wound bed Bathing/ Shower/ Hygiene: May shower and wash wound with soap and water. - with dressing changes Edema Control - Lymphedema / SCD / Other: Elevate legs to the level of the heart or above for 30 minutes daily and/or when sitting, a frequency of: - throughout the day Off-Loading: Turn and reposition every 2 hours - *** Try to shift from side to side and shift to belly (if tolerated),****avoid sitting up in bed except for meals Additional Orders / Instructions: Follow Nutritious Diet - protein shakes 2-3 times per day, Juven 2 times per day Home Health: No change in wound care orders this week; continue Home Health for wound care. May utilize formulary equivalent dressing for wound treatment orders unless otherwise specified. Dressing changes to be completed by [facility] on Monday / Wednesday / Friday except when patient has  scheduled visit at [facility]. - increase visits to 3 times per week (spouse having difficulty with dressing changes) Other Home Health Orders/Instructions: - [facility] WOUND #12: - Ischial Tuberosity Wound Laterality: Left Cleanser: Soap and Water 1 x Per Day/30 Days Discharge Instructions: May shower and wash wound with dial antibacterial soap and water prior to dressing change. Cleanser: Wound Cleanser (Generic) 1 x Per Day/30 Days Discharge Instructions: Cleanse the wound with wound cleanser prior to applying a clean dressing using gauze sponges, not tissue or cotton balls. Peri-Wound Care: Skin Prep (Generic) 1 x Per Day/30 Days Discharge Instructions: Use skin prep as directed Prim Dressing: Santyl Ointment 1 x Per Day/30 Days ary Discharge Instructions: Apply nickel thick amount to wound bed then pack with silver alginate Secondary Dressing: Woven Gauze Sponge, Non-Sterile 4x4 in (Generic) 1 x Per Day/30 Days Discharge Instructions: moisten with saline and pack into wound Secondary Dressing: Zetuvit Plus Silicone Border Dressing 5x5 (in/in) (Generic) 1 x Per Day/30 Days Discharge Instructions: Apply silicone border or ABD padover primary dressing as directed. WOUND #15: - Ischium Wound Laterality: Right Cleanser: Soap and Water 1 x Per Day/30 Days Discharge Instructions: May shower and wash wound with dial antibacterial soap and water prior to dressing change. Cleanser: Wound Cleanser (Generic) 1 x Per Day/30 Days Discharge Instructions: Cleanse the wound with wound cleanser prior to applying a clean dressing using gauze sponges, not tissue or cotton balls. Peri-Wound Care: Skin Prep (Generic) 1 x Per Day/30 Days Discharge Instructions: Use skin prep as directed Prim Dressing: Santyl Ointment 1 x Per Day/30 Days ary Discharge Instructions: Apply nickel thick amount to wound bed then pack with silver alginate Secondary Dressing: Woven Gauze Sponge, Non-Sterile 4x4 in  (Generic) 1 x Per Day/30 Days Discharge Instructions: moisten with saline and pack into wound Secondary Dressing: Zetuvit Plus Silicone Border Dressing  5x5 (in/in) (Generic) 1 x Per Day/30 Days Discharge Instructions: Apply silicone border or ABD padover primary dressing as directed. 12/03/2021: Significant improvement in both of the wounds. There is still some fibrinous slough on the right ischial wound. There is also a heavy layer of slough and fibrinous exudate on the left ischial wound as well as some soft light slough on the remainder of the wound surface. I used a curette to debride slough and nonviable subcutaneous tissue from both of the wounds. I think she will benefit from continued use of Santyl to further breakdown the nonviable tissue. We had been using silver alginate in addition, but I do not see that this is really contributing much and in an effort to curb cost, we will switch to saline moistened gauze packing the Santyl. Follow-up in 1 month. Electronic Signature(s) Signed: 12/03/2021 11:35:40 AM By: Fredirick Maudlin MD FACS Entered By: Fredirick Maudlin on 12/03/2021 11:35:40 -------------------------------------------------------------------------------- HxROS Details Patient Name: Date of Service: [name], [name]. 12/03/2021 10:30 A M Medical Record Number: [id number] Patient Account Number: [id number] Date of Birth/Sex: Treating RN: 11-15-1950 (71 y.o. [name]: [name] Other Clinician: Referring Provider: Treating Provider/Extender: Nance Pew in Treatment: 7 Information Obtained From Patient Eyes Medical History: Negative for: Cataracts; Glaucoma; Optic Neuritis Ear/Nose/Mouth/Throat Medical History: Negative for: Chronic sinus problems/congestion; Middle ear problems Hematologic/Lymphatic Medical History: Positive for: Anemia Negative for: Hemophilia; Human Immunodeficiency Virus; Lymphedema; Sickle  Cell Disease Respiratory Medical History: Negative for: Aspiration; Asthma; Chronic Obstructive Pulmonary Disease (COPD); Pneumothorax; Sleep Apnea; Tuberculosis Cardiovascular Medical History: Positive for: Hypertension Negative for: Angina; Arrhythmia; Congestive Heart Failure; Coronary Artery Disease; Deep Vein Thrombosis; Hypotension; Myocardial Infarction; Peripheral Arterial Disease; Peripheral Venous Disease; Phlebitis; Vasculitis Gastrointestinal Medical History: Negative for: Cirrhosis ; Colitis; Crohns; Hepatitis A; Hepatitis B; Hepatitis C Past Medical History Notes: constipation GERD Endocrine Medical History: Positive for: Type II Diabetes Negative for: Type I Diabetes Time with diabetes: 1 year Treated with: Oral agents Blood sugar tested every day: No Genitourinary Medical History: Negative for: End Stage Renal Disease Past Medical History Notes: UTI kidney stones neurogenic bladder Immunological Medical History: Negative for: Lupus Erythematosus; Raynauds; Scleroderma Integumentary (Skin) Medical History: Negative for: History of Burn Past Medical History Notes: left gluteal fold sacral Musculoskeletal Medical History: Positive for: Osteoarthritis Negative for: Gout; Rheumatoid Arthritis; Osteomyelitis Past Medical History Notes: cervical neuropathy osteomyelitis Neurologic Medical History: Positive for: Dementia; Paraplegia Negative for: Neuropathy; Quadriplegia Oncologic Medical History: Positive for: Received Radiation - 2012 - right breast Psychiatric Medical History: Negative for: Anorexia/bulimia; Confinement Anxiety Past Medical History Notes: admitted for suicide risk on 12/02/2018 Immunizations Pneumococcal Vaccine: Received Pneumococcal Vaccination: Yes Received Pneumococcal Vaccination On or After 60th Birthday: No Implantable Devices None Hospitalization / Surgery History Type of Hospitalization/Surgery left knee  replacement right breast lumpectomy cervical laminectomy with fusion hysterectomy tear duct surgery Tand A UTI suprapubic cath placement UTI Family and Social History Unknown History: Yes; Cancer: Yes - Mother; Diabetes: Yes - Maternal Grandparents; Heart Disease: Yes - Mother; Hereditary Spherocytosis: No; Hypertension: Yes - Mother; Kidney Disease: No; Lung Disease: No; Seizures: No; Stroke: No; Thyroid Problems: Yes - Mother; Tuberculosis: No; Former smoker - ended on 11/06/2014; Marital Status - Married; Alcohol Use: Never; Drug Use: No History; Caffeine Use: Daily - T soda; Financial Concerns: No; ea, Food, Clothing or Shelter Needs: No; Support System Lacking: No; Transportation Concerns: No Engineer, maintenance) Signed: 12/03/2021 12:03:01 PM By: Fredirick Maudlin MD FACS Signed: 12/03/2021  6:08:33 PM By: Baruch Gouty RN, BSN Entered By: Fredirick Maudlin on 12/03/2021 11:29:38 -------------------------------------------------------------------------------- [place] Details Patient Name: Date of Service: [name] 12/03/2021 Medical Record Number: [id number] Patient Account Number: [id number] Date of Birth/Sex: Treating RN: [birth year] (71 y.o. [name], [name] Primary Care Provider: [name] Other Clinician: Referring Provider: Treating Provider/Extender: [place] Amor Weeks in Treatment: 152 Diagnosis Coding ICD-10 Codes Code Description [phone] Pressure ulcer of left buttock, stage 4 E11.622 Type 2 diabetes mellitus with other skin ulcer L89.313 Pressure ulcer of right buttock, stage 3 Facility Procedures The patient participates with Medicare or their insurance follows the Medicare Facility Guidelines: CPT4 Code Description Modifier Quantity [id number] 11042 - DEB SUBQ TISSUE 20 SQ CM/< 1 ICD-10 Diagnosis Description 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,  stage 4 L89.313 Pressure ulcer of right buttock, stage 3 The patient participates with  Medicare or their insurance follows the Medicare Facility Guidelines: [id number] 11045 - DEB SUBQ TISS EA ADDL 20CM 1 ICD-10 Diagnosis Description 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, stage 4 L89.313 Pressure ulcer of right  buttock, stage 3 Physician Procedures : CPT4 Code Description Modifier [id number] - WC PHYS LEVEL 4 - EST PT 25 ICD-10 Diagnosis Description 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, stage 4 L89.313 Pressure ulcer of right buttock, stage 3 E11.622 Type 2 diabetes mellitus with other skin  ulcer Quantity: 1 : [id number] 11042 - WC PHYS SUBQ TISS 20 SQ CM ICD-10 Diagnosis Description 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, stage 4 L89.313 Pressure ulcer of right buttock, stage 3 Quantity: 1 : [id number] 40973 - WC PHYS SUBQ TISS EA ADDL 20 CM ICD-10 Diagnosis Description 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, stage 4 L89.313 Pressure ulcer of right buttock, stage 3 Quantity: 1 Electronic Signature(s) Signed: 12/03/2021 11:36:19 AM By: Fredirick Maudlin MD FACS Entered By: Fredirick Maudlin on 12/03/2021 11:36:19

## 2021-12-03 NOTE — Progress Notes (Signed)
[name], [name] ([id number]) Visit Report for 12/03/2021 Arrival Information Details Patient Name: Date of Service: [name], [name] 12/03/2021 10:30 A M Medical Record Number: [id number] Patient Account Number: [id number] Date of Birth/Sex: Treating RN: 03-30-1951 (71 y.o. [name] [name]: [name] Other Clinician: Referring Ahad Colarusso: Treating Madelynne Lasker/Extender: Nance Pew in Treatment: 51 Visit Information History Since Last Visit Added or deleted any medications: No Patient Arrived: Wheel Chair Any new allergies or adverse reactions: No Arrival Time: 10:43 Had a fall or experienced change in No Accompanied By: spouse activities of daily living that may affect Transfer Assistance: [name] risk of falls: Patient Identification Verified: Yes Signs or symptoms of abuse/neglect since last visito No Secondary Verification Process Completed: Yes Hospitalized since last visit: No Patient Requires Transmission-Based Precautions: No Implantable device outside of the clinic excluding No Patient Has Alerts: No cellular tissue based products placed in the center since last visit: Has Dressing in Place as Prescribed: Yes Pain Present Now: No Electronic Signature(s) Signed: 12/03/2021 6:08:33 PM By: Baruch Gouty RN, BSN Entered By: Baruch Gouty on 12/03/2021 10:45:39 -------------------------------------------------------------------------------- Lower Extremity Assessment Details Patient Name: Date of Service: [name]. 12/03/2021 10:30 A M Medical Record Number: [id number] Patient Account Number: [id number] Date of Birth/Sex: Treating RN: [birth year] (71 y.o. [name] Aidaly Cordner: [name] Other Clinician: Referring Tayson Schnelle: Treating Korben Carcione/Extender: Nance Pew in Treatment: 152 Electronic Signature(s) Signed: 12/03/2021 6:08:33 PM By: Baruch Gouty RN, BSN Entered  By: Baruch Gouty on 12/03/2021 10:47:14 -------------------------------------------------------------------------------- Multi Wound Chart Details Patient Name: Date of Service: [name], [name] Breach. 12/03/2021 10:30 A M Medical Record Number: [id number] Patient Account Number: [id number] Date of Birth/Sex: Treating RN: [birth year] (72 y.o. Martyn Malay, Linda Primary [name] Delawrence Fridman: [name] Other Clinician: Referring Andrik Sandt: Treating Junious Ragone/Extender: Nance Pew in Treatment: 152 Vital Signs Height(in): 37 Pulse(bpm): 16 Weight(lbs): 185 Blood Pressure(mmHg): 138/71 Body Mass Index(BMI): 32.8 Temperature(F): 97.5 Respiratory Rate(breaths/min): 18 Photos: [N/A:N/A] Left Ischial Tuberosity Right Ischium N/A Wound Location: Pressure Injury Pressure Injury N/A Wounding Event: Pressure Ulcer Pressure Ulcer N/A Primary Etiology: Anemia, Hypertension, Type II Anemia, Hypertension, Type II N/A Comorbid History: Diabetes, Osteoarthritis, Dementia, Diabetes, Osteoarthritis, Dementia, Paraplegia, Received Radiation Paraplegia, Received Radiation 09/06/2018 07/21/2021 N/A Date Acquired: 152 18 N/A Weeks of Treatment: Open Open N/A Wound Status: No No N/A Wound Recurrence: 3x8.3x1.6 2x1.7x1 N/A Measurements L x W x D (cm) 19.556 2.67 N/A A (cm) : rea 31.29 2.67 N/A Volume (cm) : -8186.40% 83.90% N/A % Reduction in A rea: -14659.40% -60.70% N/A % Reduction in Volume: 12 12 Starting Position 1 (o'clock): 6 5 Ending Position 1 (o'clock): 1.7 0.4 Maximum Distance 1 (cm): Yes Yes N/A Undermining: Category/Stage IV Category/Stage III N/A Classification: Large Medium N/A Exudate A mount: Serosanguineous Serosanguineous N/A Exudate Type: red, brown red, brown N/A Exudate Color: Well defined, not attached Epibole N/A Wound Margin: Medium (34-66%) Medium (34-66%) N/A Granulation A mount: Red, Pink Red N/A Granulation Quality: Medium (34-66%)  Medium (34-66%) N/A Necrotic A mount: Fat Layer (Subcutaneous Tissue): Yes Fat Layer (Subcutaneous Tissue): Yes N/A Exposed Structures: Fascia: No Fascia: No Tendon: No Tendon: No Muscle: No Muscle: No Joint: No Joint: No Bone: No Bone: No None None N/A Epithelialization: Debridement - Excisional Debridement - Excisional N/A Debridement: Pre-procedure Verification/Time Out 11:15 11:15 N/A Taken: Lidocaine 4% Topical Solution Lidocaine 4% Topical Solution N/A Pain Control: Subcutaneous, Slough Subcutaneous, Slough N/A Tissue Debrided:  Skin/Subcutaneous Tissue Skin/Subcutaneous Tissue N/A Level: 24 3.4 N/A Debridement A (sq cm): rea Curette Curette N/A Instrument: Minimum Minimum N/A Bleeding: Pressure Pressure N/A Hemostasis A chieved: Insensate Insensate N/A Procedural Pain: Insensate Insensate N/A Post Procedural Pain: Procedure was tolerated well Procedure was tolerated well N/A Debridement Treatment Response: 3x8.3x1.6 2x1.7x1 N/A Post Debridement Measurements L x W x D (cm) 31.29 2.67 N/A Post Debridement Volume: (cm) Category/Stage IV Category/Stage III N/A Post Debridement Stage: Debridement Debridement N/A Procedures Performed: Treatment Notes Electronic Signature(s) Signed: 12/03/2021 11:25:33 AM By: Fredirick Maudlin MD FACS Signed: 12/03/2021 6:08:33 PM By: Baruch Gouty RN, BSN Entered By: Fredirick Maudlin on 12/03/2021 11:25:33 -------------------------------------------------------------------------------- Multi-Disciplinary [name] Plan Details Patient Name: Date of Service: [name], [name] Breach. 12/03/2021 10:30 A M Medical Record Number: [id number] Patient Account Number: [id number] Date of Birth/Sex: Treating RN: [birth year] (71 y.o. [name] Shareen Capwell: [name] Other Clinician: Referring Durwood Dittus: Treating Nainoa Woldt/Extender: Nance Pew in Treatment: [place] reviewed with  physician Active Inactive Pressure Nursing Diagnoses: Knowledge deficit related to management of pressures ulcers Potential for impaired tissue integrity related to pressure, friction, moisture, and shear Goals: Patient/caregiver will verbalize understanding of pressure ulcer management Date Initiated: 05/22/2021 Target Resolution Date: 12/31/2021 Goal Status: Active Interventions: Assess: immobility, friction, shearing, incontinence upon admission and as needed Assess offloading mechanisms upon admission and as needed Notes: Wound/Skin Impairment Nursing Diagnoses: Knowledge deficit related to ulceration/compromised skin integrity Goals: Patient/caregiver will verbalize understanding of skin [name] regimen Date Initiated: 12/30/2018 Target Resolution Date: 12/31/2021 Goal Status: Active Interventions: Assess patient/caregiver ability to obtain necessary supplies Assess patient/caregiver ability to perform ulcer/skin [name] regimen upon admission and as needed Assess ulceration(s) every visit Provide education on ulcer and skin [name] Treatment Activities: Skin [name] regimen initiated : 12/30/2018 Topical wound management initiated : 12/30/2018 Notes: 01/08/21: Wound [name] regimen continues Electronic Signature(s) Signed: 12/03/2021 6:08:33 PM By: Baruch Gouty RN, BSN Entered By: Baruch Gouty on 12/03/2021 11:14:45 -------------------------------------------------------------------------------- Pain Assessment Details Patient Name: Date of Service: [name]. 12/03/2021 10:30 A M Medical Record Number: [id number] Patient Account Number: [id number] Date of Birth/Sex: Treating RN: Jan 27, 1951 (71 y.o. [name] Suzie Vandam: [name] Other Clinician: Referring Gabe Glace: Treating Ramona Ruark/Extender: Nance Pew in Treatment: 152 Active Problems Location of Pain Severity and Description of Pain Patient Has Paino No Site  Locations Rate the pain. Current Pain Level: 0 Pain Management and Medication Current Pain Management: Electronic Signature(s) Signed: 12/03/2021 6:08:33 PM By: Baruch Gouty RN, BSN Entered By: Baruch Gouty on 12/03/2021 10:47:04 -------------------------------------------------------------------------------- Patient/Caregiver Education Details Patient Name: Date of Service: [name] 8/29/2023andnbsp10:30 [place] Record Number: [id number] Patient Account Number: [id number] Date of Birth/Gender: Treating RN: Jul 13, 1950 (71 y.o. [name] Physician: [name] Other Clinician: Referring Physician: Treating Physician/Extender: Nance Pew in Treatment: 152 Education Assessment Education Provided To: Patient Education Topics Provided Pressure: Methods: Explain/Verbal Responses: Reinforcements needed, State content correctly Wound/Skin Impairment: Methods: Explain/Verbal Responses: Reinforcements needed, State content correctly Electronic Signature(s) Signed: 12/03/2021 6:08:33 PM By: Baruch Gouty RN, BSN Entered By: Baruch Gouty on 12/03/2021 11:15:08 -------------------------------------------------------------------------------- Wound Assessment Details Patient Name: Date of Service: [name]. 12/03/2021 10:30 A M Medical Record Number: [id number] Patient Account Number: [id number] Date of Birth/Sex: Treating RN: Aug 10, 1950 (71 y.o. [name] Dinnis Rog: [name] Other Clinician: Referring Laresa Oshiro: Treating Ashwin Tibbs/Extender: Nance Pew in  Treatment: 152 Wound Status Wound Number: 12 Primary Pressure Ulcer Etiology: Wound Location: Left Ischial Tuberosity Wound Open Wounding Event: Pressure Injury Status: Date Acquired: 09/06/2018 Comorbid Anemia, Hypertension, Type II Diabetes, Osteoarthritis, Weeks Of Treatment: 152 History: Dementia,  Paraplegia, Received Radiation Clustered Wound: No Photos Wound Measurements Length: (cm) 3 Width: (cm) 8.3 Depth: (cm) 1.6 Area: (cm) 19.556 Volume: (cm) 31.29 % Reduction in Area: -8186.4% % Reduction in Volume: -14659.4% Epithelialization: None Tunneling: No Undermining: Yes Starting Position (o'clock): 12 Ending Position (o'clock): 6 Maximum Distance: (cm) 1.7 Wound Description Classification: Category/Stage IV Wound Margin: Well defined, not attached Exudate Amount: Large Exudate Type: Serosanguineous Exudate Color: red, brown Foul Odor After Cleansing: No Slough/Fibrino Yes Wound Bed Granulation Amount: Medium (34-66%) Exposed Structure Granulation Quality: Red, Pink Fascia Exposed: No Necrotic Amount: Medium (34-66%) Fat Layer (Subcutaneous Tissue) Exposed: Yes Necrotic Quality: Adherent Slough Tendon Exposed: No Muscle Exposed: No Joint Exposed: No Bone Exposed: No Electronic Signature(s) Signed: 12/03/2021 6:08:33 PM By: Baruch Gouty RN, BSN Entered By: Baruch Gouty on 12/03/2021 11:10:44 -------------------------------------------------------------------------------- Wound Assessment Details Patient Name: Date of Service: [name]. 12/03/2021 10:30 A M Medical Record Number: [id number] Patient Account Number: [id number] Date of Birth/Sex: Treating RN: November 03, 1950 (71 y.o. Martyn Malay, Vaughan Basta Primary [name] Harlen Danford: [name] Other Clinician: Referring Glendene Wyer: Treating Dreshon Proffit/Extender: Burgess Amor Weeks in Treatment: 152 Wound Status Wound Number: 15 Primary Pressure Ulcer Etiology: Wound Location: Right Ischium Wound Open Wounding Event: Pressure Injury Status: Date Acquired: 07/21/2021 Comorbid Anemia, Hypertension, Type II Diabetes, Osteoarthritis, Weeks Of Treatment: 18 History: Dementia, Paraplegia, Received Radiation Clustered Wound: No Photos Wound Measurements Length: (cm) 2 Width: (cm) 1.7 Depth: (cm)  1 Area: (cm) 2.67 Volume: (cm) 2.67 % Reduction in Area: 83.9% % Reduction in Volume: -60.7% Epithelialization: None Tunneling: No Undermining: Yes Starting Position (o'clock): 12 Ending Position (o'clock): 5 Maximum Distance: (cm) 0.4 Wound Description Classification: Category/Stage III Wound Margin: Epibole Exudate Amount: Medium Exudate Type: Serosanguineous Exudate Color: red, brown Foul Odor After Cleansing: No Slough/Fibrino Yes Wound Bed Granulation Amount: Medium (34-66%) Exposed Structure Granulation Quality: Red Fascia Exposed: No Necrotic Amount: Medium (34-66%) Fat Layer (Subcutaneous Tissue) Exposed: Yes Necrotic Quality: Adherent Slough Tendon Exposed: No Muscle Exposed: No Joint Exposed: No Bone Exposed: No Electronic Signature(s) Signed: 12/03/2021 6:08:33 PM By: Baruch Gouty RN, BSN Entered By: Baruch Gouty on 12/03/2021 11:09:55 -------------------------------------------------------------------------------- Vitals Details Patient Name: Date of Service: [name] Pulse M. 12/03/2021 10:30 A M Medical Record Number: [id number] Patient Account Number: [id number] Date of Birth/Sex: Treating RN: [birth year] (71 y.o. Martyn Malay, Vaughan Basta Primary [name] Marquavion Venhuizen: [name] Other Clinician: Referring Cartez Mogle: Treating Melony Tenpas/Extender: Nance Pew in Treatment: 152 Vital Signs Time Taken: 10:45 Temperature (F): 97.5 Height (in): 63 Pulse (bpm): 85 Weight (lbs): 185 Respiratory Rate (breaths/min): 18 Body Mass Index (BMI): 32.8 Blood Pressure (mmHg): 138/71 Reference Range: 80 - 120 mg / dl Electronic Signature(s) Signed: 12/03/2021 6:08:33 PM By: Baruch Gouty RN, BSN Entered By: Baruch Gouty on 12/03/2021 10:46:50

## 2021-12-05 ENCOUNTER — Ambulatory Visit: Payer: Medicare Other | Admitting: Podiatry

## 2021-12-05 ENCOUNTER — Ambulatory Visit (INDEPENDENT_AMBULATORY_CARE_PROVIDER_SITE_OTHER): Payer: Medicare Other

## 2021-12-05 DIAGNOSIS — M25571 Pain in right ankle and joints of right foot: Secondary | ICD-10-CM | POA: Diagnosis not present

## 2021-12-05 DIAGNOSIS — S82891A Other fracture of right lower leg, initial encounter for closed fracture: Secondary | ICD-10-CM

## 2021-12-06 NOTE — Progress Notes (Signed)
Subjective:   Patient [name], female   DOB: 71 y.o.   MRN: [id number]   HPI 71 year old female presents the office today with her husband for concerns of ankle fracture on the right side.  She states that this happened on Tuesday, December 03, 2021.  She is bedbound and nonambulatory and her husband is trying to help transition her from the wheelchair and her foot got caught twisting her ankle.  She was seen at [facility] at the time and x-rays were performed.  She had no treatment, no splinting or bracing.  She was referred to evaluation.  Of note she has had a wound on the lateral aspect of her ankle previously and per the husband that the skin has not changed.  Last A1c was 5.7.   Review of Systems  All other systems reviewed and are negative.  Past Medical History:  Diagnosis Date   Anemia    Arthritis    "all over" (04/13/2012)   Cancer of right breast ([place]) 2011   Cervical neuropathy    PERIPHERAL NEUROPATHY , HAD CERVICAL FUSION   Diabetes mellitus without complication ([place])    Difficult intubation    Fastrach #4 LMA then # 7 ETT used in 2006 [facility], cervical laminectomy)   GERD (gastroesophageal reflux disease)    Group B streptococcal infection 11/08/2020   History of kidney stones    Hypercholesteremia    Hypertension    Hypothyroidism    Kidney stones    Neurogenic bladder    Osteomyelitis (HCC)    Paraplegia (HCC)    Peripheral neuropathy    PONV (postoperative nausea and vomiting)    Renal insufficiency 12/11/2020   Sacral decubitus ulcer 10/2014   Sepsis due to urinary tract infection [facility])     Past Surgical History:  Procedure Laterality Date   ABDOMINAL HYSTERECTOMY  ~ [place] Right 2011   BREAST LUMPECTOMY Right 2011   CALDWELL LUC  ~ 2003   "benign tumor removed from up under gum" (04/13/2012)   DACROCYSTORHINOSTOMY  ~ 2000   "put stents in my tear ducts; both eyes" (04/13/2012)   EYE SURGERY  2004   STENTS TO BIL  EYES   I & D EXTREMITY Bilateral 11/03/2014   Procedure: IRRIGATION AND DEBRIDEMENT BILATERAL HEEL;  Surgeon: Irene Limbo, MD;  Location: [place];  Service: Plastics;  Laterality: Bilateral;   INCISION AND DRAINAGE OF WOUND Left 11/03/2014   Procedure: IRRIGATION AND DEBRIDEMENT SACRAL ULCER;  Surgeon: Irene Limbo, MD;  Location: [place];  Service: Plastics;  Laterality: Left;   JOINT REPLACEMENT     POSTERIOR CERVICAL LAMINECTOMY  2006   SKIN SPLIT GRAFT Left 08/05/2016   Procedure: SURGICAL PREP  FOR GRAFTING APPLICATION ACELL TO LEFT ISCHIUM;  Surgeon: Irene Limbo, MD;  Location: [place];  Service: Plastics;  Laterality: Left;   TONSILLECTOMY AND ADENOIDECTOMY  ~ 1954   TOTAL KNEE ARTHROPLASTY WITH REVISION COMPONENTS  04/13/2012   Procedure: TOTAL KNEE ARTHROPLASTY WITH REVISION COMPONENTS;  Surgeon: Hessie Dibble, MD;  Location: [place];  Service: Orthopedics;  Laterality: Left;     Current Outpatient Medications:    Ascorbic Acid (VITAMIN C) 1000 MG tablet, Take 1,000 mg by mouth daily., Disp: , Rfl:    bisacodyl (DULCOLAX) 10 MG suppository, Place 1 suppository (10 mg total) rectally daily as needed for severe constipation., Disp: 12 suppository, Rfl: 0   [employer] SENNA 8.6 MG tablet, Take  2 tablets by mouth daily as needed for constipation., Disp: , Rfl:    docusate sodium (COLACE) 100 MG capsule, Take 100 mg by mouth 2 (two) times daily as needed (FOR CONSTIPATION). , Disp: , Rfl:    DULoxetine (CYMBALTA) 30 MG capsule, Take 1 capsule (30 mg total) by mouth daily., Disp: 30 capsule, Rfl: 1   ferrous sulfate 325 (65 FE) MG tablet, Take 325 mg by mouth daily after supper. , Disp: , Rfl:    folic acid (FOLVITE) 1 MG tablet, Take 1 mg by mouth daily. , Disp: , Rfl:    levothyroxine (SYNTHROID, LEVOTHROID) 125 MCG tablet, Take 1 tablet (125 mcg total) by mouth daily before breakfast. (Patient taking differently: Take 125 mcg by mouth daily.), Disp: 30 tablet, Rfl: 0   metFORMIN  (GLUCOPHAGE) 1000 MG tablet, Take 1,000 mg by mouth 2 (two) times daily., Disp: , Rfl:    metoprolol (LOPRESSOR) 100 MG tablet, Take 100 mg by mouth 2 (two) times daily. , Disp: , Rfl:    Multiple Vitamin (MULTIVITAMIN) capsule, Take 1 capsule by mouth daily., Disp: , Rfl:    NIFEdipine (PROCARDIA XL/ADALAT-CC) 30 MG 24 hr tablet, Take 30 mg by mouth daily after supper. , Disp: , Rfl:    oxyCODONE-acetaminophen (PERCOCET) 10-325 MG tablet, Take 1 tablet by mouth every 6 (six) hours as needed for pain. , Disp: , Rfl:    polyethylene glycol (MIRALAX / GLYCOLAX) 17 g packet, Take 17 g by mouth 2 (two) times daily. (Patient taking differently: Take 17 g by mouth daily as needed for mild constipation.), Disp: 30 each, Rfl: 0   pregabalin (LYRICA) 25 MG capsule, Take 1 capsule (25 mg total) by mouth 2 (two) times daily., Disp: 60 capsule, Rfl: 1   Zinc Sulfate 220 (50 Zn) MG TABS, Take 50 mg by mouth daily., Disp: , Rfl:   Allergies  Allergen Reactions   Ivp Dye [Iodinated Contrast Media] Other (See Comments)    "hot and sweaty and almost passed out"   Aspirin Hives   Morphine And Related Itching and Rash   Sulfa Antibiotics Hives           Objective:  Physical Exam  General: AAO x3, NAD  Dermatological: Skin is warm, dry and supple bilateral.  There are no open sores, no preulcerative lesions, no rash or signs of infection present.  On the lateral aspect of the ankle there is an area of pigment change from an area of previous ulceration.  It is pink in color.  There is no drainage or any open wound noted.  Vascular: Dorsalis Pedis artery and Posterior Tibial artery pedal pulses are 1/4 bilateral with immedate capillary fill time. There is no pain with calf compression, swelling, warmth, erythema.   Neruologic: Sensation absent  Musculoskeletal: Mild deformity to the ankle.  No pain  Gait: In wheelchair      Assessment:   Bimalleolar ankle fracture     Plan:  -Treatment options  discussed including all alternatives, risks, and complications -Etiology of symptoms were discussed -X-rays were obtained and reviewed with the patient.  Attempted to obtain 3 views of the right ankle.  Fracture of the distal fibula and tibia. -Difficult situation to be had given her comorbidities and she is nonambulatory would not recommend surgical intervention for her however I am concerned about the area of the skin on the lateral ankle that we need to monitor very closely.  Per the husband the color and what I thought  was a wound at first has been present for quite some time from her previous wound and this appears to be a scar.  We need to monitor this very closely.  I would like to immobilize her in a placed into a boot but making sure to pad the lateral ankle very well.  Discussed with husband he is to monitor this daily for any open wounds. -Skin checks daily -Elevation  Return in about 3 weeks (around 12/26/2021) for ankle fracture, x-ray.  Trula Slade DPM

## 2021-12-14 ENCOUNTER — Emergency Department (HOSPITAL_COMMUNITY): Payer: Medicare Other

## 2021-12-14 ENCOUNTER — Inpatient Hospital Stay (HOSPITAL_COMMUNITY)
Admission: EM | Admit: 2021-12-14 | Discharge: 2021-12-17 | DRG: 391 | Disposition: A | Discharge status: Home / Self Care | Payer: Medicare Other | Attending: Student in an Organized Health Care Education/Training Program | Admitting: Student in an Organized Health Care Education/Training Program

## 2021-12-14 ENCOUNTER — Other Ambulatory Visit: Payer: Self-pay

## 2021-12-14 DIAGNOSIS — D631 Anemia in chronic kidney disease: Secondary | ICD-10-CM | POA: Diagnosis present

## 2021-12-14 DIAGNOSIS — E669 Obesity, unspecified: Secondary | ICD-10-CM | POA: Diagnosis present

## 2021-12-14 DIAGNOSIS — Z79899 Other long term (current) drug therapy: Secondary | ICD-10-CM

## 2021-12-14 DIAGNOSIS — G9341 Metabolic encephalopathy: Secondary | ICD-10-CM | POA: Diagnosis not present

## 2021-12-14 DIAGNOSIS — M86652 Other chronic osteomyelitis, left thigh: Secondary | ICD-10-CM | POA: Diagnosis present

## 2021-12-14 DIAGNOSIS — Z6832 Body mass index (BMI) 32.0-32.9, adult: Secondary | ICD-10-CM | POA: Diagnosis not present

## 2021-12-14 DIAGNOSIS — G8220 Paraplegia, unspecified: Secondary | ICD-10-CM | POA: Diagnosis present

## 2021-12-14 DIAGNOSIS — E039 Hypothyroidism, unspecified: Secondary | ICD-10-CM | POA: Diagnosis present

## 2021-12-14 DIAGNOSIS — N179 Acute kidney failure, unspecified: Secondary | ICD-10-CM | POA: Diagnosis present

## 2021-12-14 DIAGNOSIS — N319 Neuromuscular dysfunction of bladder, unspecified: Secondary | ICD-10-CM | POA: Diagnosis present

## 2021-12-14 DIAGNOSIS — Z91041 Radiographic dye allergy status: Secondary | ICD-10-CM

## 2021-12-14 DIAGNOSIS — L89324 Pressure ulcer of left buttock, stage 4: Secondary | ICD-10-CM | POA: Diagnosis present

## 2021-12-14 DIAGNOSIS — K5289 Other specified noninfective gastroenteritis and colitis: Secondary | ICD-10-CM | POA: Diagnosis present

## 2021-12-14 DIAGNOSIS — G894 Chronic pain syndrome: Secondary | ICD-10-CM | POA: Diagnosis present

## 2021-12-14 DIAGNOSIS — L89314 Pressure ulcer of right buttock, stage 4: Secondary | ICD-10-CM | POA: Diagnosis present

## 2021-12-14 DIAGNOSIS — D75838 Other thrombocytosis: Secondary | ICD-10-CM | POA: Diagnosis present

## 2021-12-14 DIAGNOSIS — I5032 Chronic diastolic (congestive) heart failure: Secondary | ICD-10-CM | POA: Diagnosis present

## 2021-12-14 DIAGNOSIS — E860 Dehydration: Secondary | ICD-10-CM | POA: Diagnosis present

## 2021-12-14 DIAGNOSIS — K8020 Calculus of gallbladder without cholecystitis without obstruction: Secondary | ICD-10-CM | POA: Diagnosis present

## 2021-12-14 DIAGNOSIS — N210 Calculus in bladder: Secondary | ICD-10-CM | POA: Diagnosis present

## 2021-12-14 DIAGNOSIS — N1832 Chronic kidney disease, stage 3b: Secondary | ICD-10-CM | POA: Diagnosis present

## 2021-12-14 DIAGNOSIS — Z882 Allergy status to sulfonamides status: Secondary | ICD-10-CM

## 2021-12-14 DIAGNOSIS — I130 Hypertensive heart and chronic kidney disease with heart failure and stage 1 through stage 4 chronic kidney disease, or unspecified chronic kidney disease: Secondary | ICD-10-CM | POA: Diagnosis present

## 2021-12-14 DIAGNOSIS — N200 Calculus of kidney: Secondary | ICD-10-CM | POA: Diagnosis present

## 2021-12-14 DIAGNOSIS — M8668 Other chronic osteomyelitis, other site: Secondary | ICD-10-CM | POA: Diagnosis present

## 2021-12-14 DIAGNOSIS — Z79890 Hormone replacement therapy (postmenopausal): Secondary | ICD-10-CM

## 2021-12-14 DIAGNOSIS — D649 Anemia, unspecified: Secondary | ICD-10-CM

## 2021-12-14 DIAGNOSIS — M4628 Osteomyelitis of vertebra, sacral and sacrococcygeal region: Principal | ICD-10-CM

## 2021-12-14 DIAGNOSIS — D72825 Bandemia: Secondary | ICD-10-CM | POA: Diagnosis present

## 2021-12-14 DIAGNOSIS — Z20822 Contact with and (suspected) exposure to covid-19: Secondary | ICD-10-CM | POA: Diagnosis present

## 2021-12-14 DIAGNOSIS — Z7401 Bed confinement status: Secondary | ICD-10-CM | POA: Diagnosis not present

## 2021-12-14 DIAGNOSIS — Z886 Allergy status to analgesic agent status: Secondary | ICD-10-CM

## 2021-12-14 DIAGNOSIS — Z87891 Personal history of nicotine dependence: Secondary | ICD-10-CM

## 2021-12-14 DIAGNOSIS — Z8249 Family history of ischemic heart disease and other diseases of the circulatory system: Secondary | ICD-10-CM

## 2021-12-14 DIAGNOSIS — E1122 Type 2 diabetes mellitus with diabetic chronic kidney disease: Secondary | ICD-10-CM | POA: Diagnosis present

## 2021-12-14 DIAGNOSIS — A419 Sepsis, unspecified organism: Secondary | ICD-10-CM | POA: Diagnosis present

## 2021-12-14 DIAGNOSIS — Z7984 Long term (current) use of oral hypoglycemic drugs: Secondary | ICD-10-CM

## 2021-12-14 DIAGNOSIS — Z885 Allergy status to narcotic agent status: Secondary | ICD-10-CM

## 2021-12-14 LAB — CBC WITH DIFFERENTIAL/PLATELET
Abs Immature Granulocytes: 0.2 10*3/uL — ABNORMAL HIGH (ref 0.00–0.07)
Basophils Absolute: 0.1 10*3/uL (ref 0.0–0.1)
Basophils Relative: 0 %
Eosinophils Absolute: 0.1 10*3/uL (ref 0.0–0.5)
Eosinophils Relative: 0 %
HCT: 29.9 % — ABNORMAL LOW (ref 36.0–46.0)
Hemoglobin: 9.4 g/dL — ABNORMAL LOW (ref 12.0–15.0)
Immature Granulocytes: 1 %
Lymphocytes Relative: 5 %
Lymphs Abs: 1 10*3/uL (ref 0.7–4.0)
MCH: 28.5 pg (ref 26.0–34.0)
MCHC: 31.4 g/dL (ref 30.0–36.0)
MCV: 90.6 fL (ref 80.0–100.0)
Monocytes Absolute: 0.9 10*3/uL (ref 0.1–1.0)
Monocytes Relative: 4 %
Neutro Abs: 18 10*3/uL — ABNORMAL HIGH (ref 1.7–7.7)
Neutrophils Relative %: 90 %
Platelets: 423 10*3/uL — ABNORMAL HIGH (ref 150–400)
RBC: 3.3 MIL/uL — ABNORMAL LOW (ref 3.87–5.11)
RDW: 14.5 % (ref 11.5–15.5)
WBC: 20.2 10*3/uL — ABNORMAL HIGH (ref 4.0–10.5)
nRBC: 0 % (ref 0.0–0.2)

## 2021-12-14 LAB — URINALYSIS, ROUTINE W REFLEX MICROSCOPIC
Bilirubin Urine: NEGATIVE
Glucose, UA: NEGATIVE mg/dL
Ketones, ur: NEGATIVE mg/dL
Nitrite: NEGATIVE
Protein, ur: 100 mg/dL — AB
Specific Gravity, Urine: 1.012 (ref 1.005–1.030)
pH: 6 (ref 5.0–8.0)

## 2021-12-14 LAB — COMPREHENSIVE METABOLIC PANEL
ALT: 13 U/L (ref 0–44)
AST: 14 U/L — ABNORMAL LOW (ref 15–41)
Albumin: 3 g/dL — ABNORMAL LOW (ref 3.5–5.0)
Alkaline Phosphatase: 55 U/L (ref 38–126)
Anion gap: 11 (ref 5–15)
BUN: 74 mg/dL — ABNORMAL HIGH (ref 8–23)
CO2: 22 mmol/L (ref 22–32)
Calcium: 9.2 mg/dL (ref 8.9–10.3)
Chloride: 101 mmol/L (ref 98–111)
Creatinine, Ser: 1.87 mg/dL — ABNORMAL HIGH (ref 0.44–1.00)
GFR, Estimated: 28 mL/min — ABNORMAL LOW (ref >60)
Glucose, Bld: 128 mg/dL — ABNORMAL HIGH (ref 70–99)
Potassium: 4.5 mmol/L (ref 3.5–5.1)
Sodium: 134 mmol/L — ABNORMAL LOW (ref 135–145)
Total Bilirubin: 0.4 mg/dL (ref 0.3–1.2)
Total Protein: 8.3 g/dL — ABNORMAL HIGH (ref 6.5–8.1)

## 2021-12-14 LAB — LACTIC ACID, PLASMA: Lactic Acid, Venous: 0.7 mmol/L (ref 0.5–1.9)

## 2021-12-14 LAB — RESP PANEL BY RT-PCR (FLU A&B, COVID) ARPGX2
Influenza A by PCR: NEGATIVE
Influenza B by PCR: NEGATIVE
SARS Coronavirus 2 by RT PCR: NEGATIVE

## 2021-12-14 LAB — POC OCCULT BLOOD, ED: Fecal Occult Bld: NEGATIVE

## 2021-12-14 MED ORDER — SODIUM CHLORIDE 0.9 % IV SOLN
2.0000 g | INTRAVENOUS | Status: DC
  Administered 2021-12-14 – 2021-12-16 (×3): 2 g via INTRAVENOUS
  Filled 2021-12-14 (×3): qty 12.5

## 2021-12-14 MED ORDER — LACTATED RINGERS IV BOLUS (SEPSIS)
1000.0000 mL | Freq: Once | INTRAVENOUS | Status: AC
  Administered 2021-12-14: 1000 mL via INTRAVENOUS

## 2021-12-14 MED ORDER — INSULIN ASPART 100 UNIT/ML IJ SOLN
0.0000 [IU] | Freq: Three times a day (TID) | INTRAMUSCULAR | Status: DC
  Administered 2021-12-16 – 2021-12-17 (×2): 2 [IU] via SUBCUTANEOUS

## 2021-12-14 MED ORDER — VANCOMYCIN VARIABLE DOSE PER UNSTABLE RENAL FUNCTION (PHARMACIST DOSING): Status: DC

## 2021-12-14 MED ORDER — METRONIDAZOLE 500 MG/100ML IV SOLN: 500.0000 mg | Freq: Two times a day (BID) | INTRAVENOUS | Status: DC

## 2021-12-14 MED ORDER — VANCOMYCIN HCL 1750 MG/350ML IV SOLN
1750.0000 mg | Freq: Once | INTRAVENOUS | Status: AC
  Administered 2021-12-14: 1750 mg via INTRAVENOUS
  Filled 2021-12-14: qty 350

## 2021-12-14 MED ORDER — HEPARIN SODIUM (PORCINE) 5000 UNIT/ML IJ SOLN
5000.0000 [IU] | Freq: Three times a day (TID) | INTRAMUSCULAR | Status: DC
  Administered 2021-12-15 – 2021-12-17 (×7): 5000 [IU] via SUBCUTANEOUS
  Filled 2021-12-14 (×7): qty 1

## 2021-12-14 MED ORDER — METRONIDAZOLE 500 MG/100ML IV SOLN
500.0000 mg | Freq: Once | INTRAVENOUS | Status: AC
  Administered 2021-12-14: 500 mg via INTRAVENOUS
  Filled 2021-12-14: qty 100

## 2021-12-14 MED ORDER — OXYCODONE-ACETAMINOPHEN 7.5-325 MG PO TABS
1.0000 | ORAL_TABLET | Freq: Two times a day (BID) | ORAL | Status: DC
  Administered 2021-12-15 – 2021-12-17 (×5): 1 via ORAL
  Filled 2021-12-14 (×5): qty 1

## 2021-12-14 MED ORDER — PREGABALIN 100 MG PO CAPS
100.0000 mg | ORAL_CAPSULE | Freq: Two times a day (BID) | ORAL | Status: DC
  Administered 2021-12-15 – 2021-12-17 (×5): 100 mg via ORAL
  Filled 2021-12-14 (×5): qty 1

## 2021-12-14 MED ORDER — SODIUM CHLORIDE 0.9 % IV SOLN: 2.0000 g | Freq: Once | INTRAVENOUS | Status: DC

## 2021-12-14 MED ORDER — VANCOMYCIN HCL IN DEXTROSE 1-5 GM/200ML-% IV SOLN
1000.0000 mg | Freq: Once | INTRAVENOUS | Status: DC
Start: 2021-12-14 — End: 2021-12-14

## 2021-12-14 MED ORDER — BISACODYL 10 MG RE SUPP: 10.0000 mg | Freq: Every day | RECTAL | Status: DC | PRN

## 2021-12-14 MED ORDER — POLYETHYLENE GLYCOL 3350 17 G PO PACK
17.0000 g | PACK | Freq: Two times a day (BID) | ORAL | Status: DC
  Administered 2021-12-15 – 2021-12-16 (×3): 17 g via ORAL
  Filled 2021-12-14 (×5): qty 1

## 2021-12-14 MED ORDER — LACTATED RINGERS IV SOLN: INTRAVENOUS | Status: DC

## 2021-12-14 NOTE — ED Notes (Signed)
IV team at bedside 

## 2021-12-14 NOTE — ED Notes (Signed)
Per 3E nurse, pt can be transferred to floor.

## 2021-12-14 NOTE — ED Notes (Signed)
Per previous RN, EDP aware of inability to obtain labs.

## 2021-12-14 NOTE — ED Notes (Signed)
3E called for purple man.

## 2021-12-14 NOTE — ED Triage Notes (Signed)
Family reports she has a suprapubic catheter and saw blood in there and patient has been extremely drowsy and weak and he said this is same symptoms of her last UTI

## 2021-12-14 NOTE — Hospital Course (Addendum)
Paraplegia, htn, diabetes 2, neurogenic bladder Chronic suprapubic cath  Woke up and felt bad. Did not eat today or take medications. Felt weak Denies back pain and abd pain Husband told her urine dark.  Hassnt used bm several days.  Social: Lives at home with husband, used to live in facility. Her husband takes care of her, but needs help. Mallie Mussel? Daughter and son try to help as able. PCP is Dr. [name]   -lethargic Sepsis  Meds: Baclofen Duloxteine? Lasix 20 BID Levo Metformin Metoprolol s 100 Nifedipine Percocet Lyrica PEG  Large sacral ulcer  afebrile Tachy 118,  BUN 74 Protein gap LA 0.7 WBC 20.2 Hgb 9.4 Platelets 423 UA moderate Hgb, proteinuria, large leukocytes.  CT showed left decubitus ulcer with sclerotic left ischium compatible with reactive changes. Nonobstructing bilateral renal calculi and 5 mm bladder calculus stool  Started on cefepime and vanc, one dose methronidazole Fluids 3L  Urine/blood culture sent   Creatinine 1.87, baseline 1.2-1.4?  ----------------- 9/11:

## 2021-12-14 NOTE — ED Notes (Signed)
PA aware of pt being a hard stick

## 2021-12-14 NOTE — Sepsis Progress Note (Signed)
Elink following code sepsis °

## 2021-12-14 NOTE — Progress Notes (Signed)
Pharmacy Antibiotic Note  [name] is a 71 y.o. female for which pharmacy has been consulted for cefepime and vancomycin.  Patient with a history of HTN, paraplegia, DM, neurogenic bladder s/p suprapubic catheter. Patient presenting with AMS.  SCr 1.87 - up from 1.4 in September WBC 20.2; LA 0.7; T afeb; HR 116; RR 15  Plan: Metronidazole per MD Cefepime 2g q24hr Vancomycin 1750 mg once, subsequent dosing as indicated per random vancomycin level until renal function stable and/or improved, at which time scheduled dosing can be considered Trend WBC, Fever, Renal function F/u cultures, clinical course, WBC, fever De-escalate when able  Height: '5\' 3"'$  (160 cm) Weight: 83 kg (183 lb) IBW/kg (Calculated) : 52.4  Temp (24hrs), Avg:98.8 F (37.1 C), Min:98.8 F (37.1 C), Max:98.8 F (37.1 C)  Recent Labs  Lab 12/14/21 1336  WBC 20.2*  CREATININE 1.87*  LATICACIDVEN 0.7    Estimated Creatinine Clearance: 27.3 mL/min (A) (by C-G formula based on SCr of 1.87 mg/dL (H)).    Allergies  Allergen Reactions   Ivp Dye [Iodinated Contrast Media] Other (See Comments)    "hot and sweaty and almost passed out"   Aspirin Hives   Morphine And Related Itching and Rash   Sulfa Antibiotics Hives    Antimicrobials this admission: cefepime 9/9 >>  vancomycin 9/9 >>  flagyl 9/9 >>   Microbiology results: Pending  Thank you for allowing pharmacy to be a part of this patient's care.  Lorelei Pont, PharmD, BCPS 12/14/2021 2:32 PM ED Clinical Pharmacist -  [phone]

## 2021-12-14 NOTE — H&P (Incomplete)
Date: 12/14/2021               Patient Name:  [name] MRN: [id number]  DOB: 01/19/1951 Age / Sex: 71 y.o., female   PCP: [name], MD         Medical Service: Internal Medicine Teaching Service         Attending Physician: Dr. Sid Falcon, MD    First Contact: Delene Ruffini, MD      Pager: Lysbeth Penner [phone]      Second Contact: Iona Beard, MD      Pager: Governor Rooks [phone]           After Hours (After 5p/  First Contact Pager: [phone]  weekends / holidays): Second Contact Pager: [phone]   SUBJECTIVE   Chief Complaint: AMS  History of Present Illness:  [name] is a 71 year old with PMH of cervical myelopathy s/p cervical spine surgery 2006 which resulted in paraplegia, history of sacral ulcer complicated by chronic osteomyelitis, diabetes, neurogenic bladder s/p suprapubic catheter placement who presents to the emergency department with altered mental status.   She felt ok until she woke up this morning around 6am. She denies specific symptoms, but endorses weakness and fatigue. She has not taken her medications today or eaten. She has a chronic suprapubic catheter that was last changed 9/8.   She has a prior history of sacral ulcer dating back to 2016.  She follows closely with wound care and was previously seen by Dr. Drucilla Schmidt with infectious disease.  She underwent debridement in 2019 and cultures were negative at that time.  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  She was previously evaluated by plastic surgery at [facility] and a flap closure of the wound was planned but never performed.  Dr. Dellia Nims with wound care performed a bone biopsy and 2022 and grew group B strep.  Denies chest pain, shortness of breath, back pain, and abdominal pain. She states has chronic constipation and has not had a bowel movement in several days.   ED Course:   Meds:  Current Meds  Medication Sig   baclofen (LIORESAL) 10 MG  tablet Take 10 mg by mouth 4 (four) times daily.   DULoxetine (CYMBALTA) 60 MG capsule Take 60 mg by mouth daily.   folic acid (FOLVITE) 1 MG tablet Take 1 mg by mouth daily.    furosemide (LASIX) 40 MG tablet Take 20 mg by mouth daily.   levothyroxine (SYNTHROID, LEVOTHROID) 125 MCG tablet Take 1 tablet (125 mcg total) by mouth daily before breakfast. (Patient taking differently: Take 125 mcg by mouth daily.)   metFORMIN (GLUCOPHAGE) 1000 MG tablet Take 1,000 mg by mouth 2 (two) times daily.   metoprolol (LOPRESSOR) 100 MG tablet Take 100 mg by mouth daily.   NIFEdipine (ADALAT CC) 30 MG 24 hr tablet Take 30 mg by mouth daily.   PERCOCET 7.5-325 MG tablet Take 1 tablet by mouth 2 (two) times daily.   pregabalin (LYRICA) 100 MG capsule Take 100 mg by mouth 2 (two) times daily.   Zinc Sulfate 220 (50 Zn) MG TABS Take 50 mg by mouth daily.    Past Medical History  Past Surgical History:  Procedure Laterality Date   ABDOMINAL HYSTERECTOMY  ~ [place] Right 2011   BREAST LUMPECTOMY Right 2011   CALDWELL LUC  ~ 2003   "benign tumor removed from up under  gum" (04/13/2012)   DACROCYSTORHINOSTOMY  ~ 2000   "put stents in my tear ducts; both eyes" (04/13/2012)   EYE SURGERY  2004   STENTS TO BIL EYES   I & D EXTREMITY Bilateral 11/03/2014   Procedure: IRRIGATION AND DEBRIDEMENT BILATERAL HEEL;  Surgeon: Irene Limbo, MD;  Location: [place];  Service: Plastics;  Laterality: Bilateral;   INCISION AND DRAINAGE OF WOUND Left 11/03/2014   Procedure: IRRIGATION AND DEBRIDEMENT SACRAL ULCER;  Surgeon: Irene Limbo, MD;  Location: [place];  Service: Plastics;  Laterality: Left;   JOINT REPLACEMENT     POSTERIOR CERVICAL LAMINECTOMY  2006   SKIN SPLIT GRAFT Left 08/05/2016   Procedure: SURGICAL PREP  FOR GRAFTING APPLICATION ACELL TO LEFT ISCHIUM;  Surgeon: Irene Limbo, MD;  Location: [place];  Service: Plastics;  Laterality: Left;   TONSILLECTOMY AND ADENOIDECTOMY  ~  1954   TOTAL KNEE ARTHROPLASTY WITH REVISION COMPONENTS  04/13/2012   Procedure: TOTAL KNEE ARTHROPLASTY WITH REVISION COMPONENTS;  Surgeon: Hessie Dibble, MD;  Location: [place];  Service: Orthopedics;  Laterality: Left;    Social:  Lives With: Occupation: Support: Level of Function: PCP: Substances:  Family History:  Family History  Problem Relation Age of Onset   Heart disease Father      Allergies: Allergies as of 12/14/2021 - Review Complete 12/14/2021  Allergen Reaction Noted   Ivp dye [iodinated contrast media] Other (See Comments) 09/12/2010   Aspirin Hives 09/12/2010   Morphine and related Itching and Rash 12/26/2020   Sulfa antibiotics Hives 04/01/2012    Review of Systems: A complete ROS was negative except as per HPI.   OBJECTIVE:   Physical Exam: Blood pressure (!) 154/73, pulse (!) 117, temperature (!) 97.2 F (36.2 C), temperature source Oral, resp. rate (!) 26, height '5\' 3"'$  (1.6 m), weight 83 kg, SpO2 100 %.  Constitutional:  in no acute distress Cardiovascular: RRR, no murmurs, rubs or gallops Pulmonary/Chest: normal work of breathing on room air, lungs clear to auscultation bilaterally Abdominal: soft, non-tender, non-distended Extremities: warm, well perfused, extremity pulses 2+, *** BLE pitting edema  Labs:    Latest Ref Rng & Units 12/14/2021    1:36 PM 12/28/2020    2:48 AM 12/27/2020    2:33 AM 12/26/2020    6:44 AM 12/11/2020   10:09 AM 11/19/2020    7:34 AM 11/08/2020    9:25 AM  CBC EXTENDED  WBC 4.0 - 10.5 K/uL 20.2  7.2  5.1  5.7  4.5  5.5  5.0   RBC 3.87 - 5.11 MIL/uL 3.30  4.06  3.91  4.09  4.48  4.11  4.32   Hemoglobin 12.0 - 15.0 g/dL 9.4  12.6  11.9  12.6  13.5  12.4  13.2   HCT 36.0 - 46.0 % 29.9  38.2  37.6  41.5  42.0  40.6  40.0   Platelets 150 - 400 K/uL 423  257  253  271  297  278  299   NEUT# 1.7 - 7.7 K/uL 18.0    3.6  2,079  3.2  2,635   Lymph# 0.7 - 4.0 K/uL 1.0    1.5  1,791  1.7  1,755       Latest Ref Rng & Units  12/14/2021    1:36 PM 12/28/2020    2:48 AM 12/27/2020    2:33 AM 12/26/2020    6:44 AM 12/11/2020   10:09 AM 11/19/2020    7:34 AM 11/08/2020  9:25 AM  CMP  Glucose 70 - 99 mg/dL 128  111  107  109  95  102  92   BUN 8 - 23 mg/dL 74  '18  23  28  22  24  20   '$ Creatinine 0.44 - 1.00 mg/dL 1.87  1.42  1.59  2.35  1.19  1.35  1.31   Sodium 135 - 145 mmol/L 134  139  141  138  139  141  140   Potassium 3.5 - 5.1 mmol/L 4.5  3.8  4.2  3.9  4.9  4.3  4.4   Chloride 98 - 111 mmol/L 101  104  105  98  102  105  101   CO2 22 - 32 mmol/L '22  24  26  23  26  28  27   '$ Calcium 8.9 - 10.3 mg/dL 9.2  9.2  9.1  9.1  9.7  9.3  9.6   Total Protein 6.5 - 8.1 g/dL 8.3  7.6  7.0  7.4   7.0    Total Bilirubin 0.3 - 1.2 mg/dL 0.4  1.1  0.8  0.3   0.4    Alkaline Phos 38 - 126 U/L 55  49  47  53   42    AST 15 - 41 U/L '14  22  20  27   23    '$ ALT 0 - 44 U/L '13  18  21  21   19      '$ Imaging:   EKG: personally reviewed my interpretation is***. Prior EKG***  ASSESSMENT & PLAN:   Assessment & Plan by Problem: Active Problems:   Sepsis ([place])   NOELLE SEASE is a 71 y.o. *** with PMH *** who presented with *** and admitted for *** on hospital day 0  #Sepsis #   #Osteomyelitis of left ischium #Chronic left decubitus ulcer ***  #Non-obstructing bilateral renal calculi 5 mm bladder calculus  #Constipation CT showed moderate to large amount of stool. -Miralax BID -suppository PRN  #Cholelithiasis CT showed cholelithiasis. Denies abdominal pain.  Diet: {NAMES:3044014::"Normal","Heart Healthy","Carb-Modified","Renal","Carb/Renal","NPO","TPN","Tube Feeds"} VTE: {NAMES:3044014::"Heparin","Enoxaparin","SCDs","NOAC","None"} IVF: {NAMES:3044014::"None","NS","1/2 NS","LR","D5","D10"},{NAMES:3044014::"None","10cc/hr","25cc/hr","50cc/hr","75cc/hr","100cc/hr","110cc/hr","125cc/hr","Bolus"} Code: {NAMES:3044014::"Full","DNR","DNI","DNR/DNI","Comfort Care","Unknown"}  Prior to Admission Living Arrangement:  {NAMES:3044014::"Home, living ***","SNF, ***","Homeless","***"} Anticipated Discharge Location: {NAMES:3044014::"Home","SNF","CIR","***"} Barriers to Discharge: ***  Dispo: Admit patient to {STATUS:3044014::"Observation with expected length of stay less than 2 midnights.","Inpatient with expected length of stay greater than 2 midnights."}  Signed: Linus Galas, MD Internal Medicine Resident PGY-1  12/14/2021, 10:30 PM

## 2021-12-14 NOTE — Progress Notes (Signed)
Dear Doctor: [name], This patient has been identified as a candidate for PICC for the following reason (s): poor veins/poor circulatory system (CHF, COPD, emphysema, diabetes, steroid use, IV drug abuse, etc.) If you agree, please write an order for the indicated device. For any questions contact the Vascular Access Team at [phone] if no answer, please leave a message.  Thank you for supporting the early vascular access assessment program.

## 2021-12-14 NOTE — ED Provider Notes (Signed)
Accepted handoff at shift change from [name] PA-C. Please see prior provider note for more detail.   Briefly: Patient is 71 y.o.   -year-old female with past medical history significant for hypertension, paraplegia, DM 2 neurogenic bladder with suprapubic catheter and history of sepsis secondary to UTIs in the past.  Patient has been drowsy, somnolent since 6 AM this morning.  She has chronic sacral wounds -- followed by wound care.   DDX: concern for sepsis considered to be most likely UTI versus chronic wounds.  Plan: Follow-up on urine results and admit to hospitalist    Physical Exam  BP (!) 154/73   Pulse (!) 117   Temp (!) 97.2 F (36.2 C) (Oral)   Resp (!) 26   Ht '5\' 3"'$  (1.6 m)   Wt 83 kg   SpO2 100%   BMI 32.42 kg/m   Physical Exam Vitals and nursing note reviewed.  Constitutional:      Appearance: She is ill-appearing.     Comments: Unwell appearing 71 year old female, able to answer questions but slow to do so and oriented to self and location not to time or date  HENT:     Head: Normocephalic and atraumatic.     Nose: Nose normal.  Eyes:     General: No scleral icterus. Cardiovascular:     Rate and Rhythm: Normal rate and regular rhythm.     Pulses: Normal pulses.     Heart sounds: Normal heart sounds.  Pulmonary:     Effort: Pulmonary effort is normal. No respiratory distress.     Breath sounds: No wheezing.  Abdominal:     Palpations: Abdomen is soft.     Tenderness: There is no abdominal tenderness. There is no guarding or rebound.  Musculoskeletal:     Cervical back: Normal range of motion.  Skin:    General: Skin is warm and dry.     Capillary Refill: Capillary refill takes less than 2 seconds.  Neurological:     Mental Status: She is alert. Mental status is at baseline.  Psychiatric:        Mood and Affect: Mood normal.        Behavior: Behavior normal.     Procedures  Ultrasound ED Peripheral IV (Provider)  Date/Time: 12/14/2021 4:20  PM  Performed by: Tedd Sias, PA Authorized by: Tedd Sias, PA   Procedure details:    Skin Prep: chlorhexidine gluconate     Angiocath:  20 G   Bedside Ultrasound Guided: Yes     Images: not archived     Patient tolerated procedure without complications: Yes     Dressing applied: Yes   Comments:     Peripheral line placed with ultrasound guidance 1 attempt left side upper arm.  10 cc of blood drawn, flushed without resistance .Critical Care  Performed by: Tedd Sias, PA Authorized by: Tedd Sias, PA   Critical care provider statement:    Critical care time (minutes):  35   Critical care time was exclusive of:  Separately billable procedures and treating other patients and teaching time   Critical care was necessary to treat or prevent imminent or life-threatening deterioration of the following conditions:  Sepsis   Critical care was time spent personally by me on the following activities:  Development of treatment plan with patient or surrogate, review of old charts, re-evaluation of patient's condition, pulse oximetry, ordering and review of radiographic studies, ordering and review of laboratory studies, ordering  and performing treatments and interventions, obtaining history from patient or surrogate, examination of patient, evaluation of patient's response to treatment and blood draw for specimens (US guided IV line, obtained hx from family at bedside, discussed w hospitalist)   Care discussed with: admitting provider     ED Course / MDM   Clinical Course as of 12/14/21 1832  Sat Dec 14, 2021  1552 Line placed L upper arm [WF]  1752 IMPRESSION: 1. No evidence of acute abnormality. 2. LEFT decubitus ulcer extending to unchanged sclerotic posterior LEFT ischium, compatible with reactive changes/chronic osteomyelitis. 3. Nonobstructing bilateral renal calculi and 5 mm bladder calculus. No evidence of hydronephrosis. 4. Cholelithiasis without CT evidence  of acute cholecystitis. 5. Moderate to large amount of stool within the colon and rectum. 6. Cardiomegaly. 7. Aortic Atherosclerosis (ICD10-I70.0).   [WF]    Clinical Course User Index [WF] Tedd Sias, [place]   Medical Decision Making Amount and/or Complexity of Data Reviewed Labs: ordered. Radiology: ordered.  Risk Prescription drug management. Decision regarding hospitalization.   Labs notable for leukocytosis with left shift also notable for uremia with BUN of 74.  Also newly anemic seems at baseline hemoglobin is around 12 approximately 60 approximately 9.4 today.  This coupled with elevated BUN concerning for upper GI bleed.  Will complete rectal exam as part of my secondary assessment.  6:32 PM rectal exam with brown stool.  Mental status seems to be improving.  She is much less lethargic.  Updated family and patient that she would need to be admitted to the hospital.  She has significant leukocytosis, tachycardia, tachypnea.  Meets sepsis criteria with elevated WBC and source most likely osteomyelitis of the left ischium.  Also anemia and improving altered mental status.  Discussed with patient the need for hospitalization and further care. Patient is understanding and willing to be admitted to hospital.   Spoke with internal medicine teaching service who agrees to assume care of patient and bring her into the hospital for further evaluation and management.       [name], [place] 12/14/21 Marijo File, MD 12/14/21 2213

## 2021-12-14 NOTE — ED Provider Notes (Signed)
[facility] EMERGENCY DEPARTMENT Provider Note   CSN: [id number] Arrival date & time: 12/14/21  1228     History  Chief Complaint  Patient presents with   Altered Mental Status    [name] is a 71 y.o. female.  With past medical history of hypertension, paraplegia, diabetes, neurogenic bladder s/p suprapubic catheter placement who presents to the emergency department with altered mental status.  Level 5 caveat: Altered mental status  History provided by husband and daughter at bedside.  They state that as of 6 AM this morning the patient has been increasingly drowsy.  The husband states that last time this happened she had a UTI and he is concerned for same.  He states that today she has been lethargic, has not eaten anything or taken her medications.  States that yesterday she was acting normal.  He denies any recent fevers, cough, diarrhea.  States that she had her suprapubic catheter exchanged yesterday and has not noticed any malodorous urine.  States that her urine has been clear.  He denies any drainage from the suprapubic catheter.  He denies her complaining of any abdominal pain or noticed change in breathing.  He does note that she has 2 wounds on her bottom that he does dressing changes for.  States that she has been in wound care for a long time.  She is paraplegic and bedbound.  She was most recently admitted to the hospital on 12/26/2020.  At that time she presented as a code sepsis with elevated lactic, altered mental status.  Thought to be related to possible sacral decubitus ulcer/chronic ischial osteomyelitis.  Other possibility being suprapubic indwelling catheter.  Altered Mental Status      Home Medications Prior to Admission medications   Medication Sig Start Date End Date Taking? Authorizing Provider  Ascorbic Acid (VITAMIN C) 1000 MG tablet Take 1,000 mg by mouth daily.    [provider]  bisacodyl (DULCOLAX) 10 MG suppository  Place 1 suppository (10 mg total) rectally daily as needed for severe constipation. 09/23/18   Aline August, MD  [facility] SENNA 8.6 MG tablet Take 2 tablets by mouth daily as needed for constipation. 11/09/18   [provider]  docusate sodium (COLACE) 100 MG capsule Take 100 mg by mouth 2 (two) times daily as needed (FOR CONSTIPATION).     [provider]  DULoxetine (CYMBALTA) 30 MG capsule Take 1 capsule (30 mg total) by mouth daily. 12/29/20 02/27/21  Ganta, Anupa, DO  ferrous sulfate 325 (65 FE) MG tablet Take 325 mg by mouth daily after supper.     [provider]  folic acid (FOLVITE) 1 MG tablet Take 1 mg by mouth daily.     [provider]  levothyroxine (SYNTHROID, LEVOTHROID) 125 MCG tablet Take 1 tablet (125 mcg total) by mouth daily before breakfast. Patient taking differently: Take 125 mcg by mouth daily. 11/07/14   Francesca Oman, DO  metFORMIN (GLUCOPHAGE) 1000 MG tablet Take 1,000 mg by mouth 2 (two) times daily. 02/01/19   [provider]  metoprolol (LOPRESSOR) 100 MG tablet Take 100 mg by mouth 2 (two) times daily.  10/28/14   [provider]  Multiple Vitamin (MULTIVITAMIN) capsule Take 1 capsule by mouth daily.    [provider]  NIFEdipine (PROCARDIA XL/ADALAT-CC) 30 MG 24 hr tablet Take 30 mg by mouth daily after supper.     [provider]  oxyCODONE-acetaminophen (PERCOCET) 10-325 MG tablet Take 1 tablet by  mouth every 6 (six) hours as needed for pain.     [provider]  polyethylene glycol (MIRALAX / GLYCOLAX) 17 g packet Take 17 g by mouth 2 (two) times daily. Patient taking differently: Take 17 g by mouth daily as needed for mild constipation. 09/23/18   Aline August, MD  pregabalin (LYRICA) 25 MG capsule Take 1 capsule (25 mg total) by mouth 2 (two) times daily. 12/28/20 02/26/21  Donney Dice, DO  Zinc Sulfate 220 (50 Zn) MG TABS Take 50 mg by mouth daily. 11/07/14   [provider]       Allergies    Ivp dye [iodinated contrast media], Aspirin, Morphine and related, and Sulfa antibiotics    Review of Systems   Review of Systems  Unable to perform ROS: Mental status change    Physical Exam Updated Vital Signs BP 120/70   Pulse (!) 116   Temp 98.8 F (37.1 C) (Oral)   Resp 15   Ht '5\' 3"'$  (1.6 m)   Wt 83 kg   SpO2 98%   BMI 32.42 kg/m  Physical Exam Vitals and nursing note reviewed.  Constitutional:      General: She is in acute distress.     Appearance: She is ill-appearing and toxic-appearing. She is not diaphoretic.  HENT:     Head: Normocephalic and atraumatic.     Mouth/Throat:     Mouth: Mucous membranes are dry.     Pharynx: Oropharynx is clear.  Eyes:     General: No scleral icterus.    Pupils: Pupils are equal, round, and reactive to light.  Cardiovascular:     Rate and Rhythm: Regular rhythm. Tachycardia present.     Pulses: Normal pulses.     Heart sounds: No murmur heard. Pulmonary:     Effort: Pulmonary effort is normal. No respiratory distress.     Breath sounds: Normal breath sounds.  Abdominal:     General: Bowel sounds are normal.     Palpations: Abdomen is soft.     Tenderness: There is no abdominal tenderness. There is no guarding.  Musculoskeletal:        General: Normal range of motion.     Cervical back: Neck supple.  Skin:    General: Skin is warm and dry.     Capillary Refill: Capillary refill takes less than 2 seconds.     Comments: Bilateral ischial wounds that are foul-smelling.  They are packed with gauze and Santyl.  Each are at least stage III.  Neurological:     Mental Status: She is lethargic and confused.     Motor: Weakness present.     Comments: Global weakness.  She is drowsy.  I have to repeat questions for her to answer.  She states that she was in the emergency department but does not know what the year is or the month.     ED Results / Procedures / Treatments   Labs (all labs ordered are listed, but  only abnormal results are displayed) Labs Reviewed  COMPREHENSIVE METABOLIC PANEL - Abnormal; Notable for the following components:      Result Value   Sodium 134 (*)    Glucose, Bld 128 (*)    BUN 74 (*)    Creatinine, Ser 1.87 (*)    Total Protein 8.3 (*)    Albumin 3.0 (*)    AST 14 (*)    GFR, Estimated 28 (*)    All other components within normal limits  CBC WITH DIFFERENTIAL/PLATELET - Abnormal; Notable for the following components:   WBC 20.2 (*)    RBC 3.30 (*)    Hemoglobin 9.4 (*)    HCT 29.9 (*)    Platelets 423 (*)    Neutro Abs 18.0 (*)    Abs Immature Granulocytes 0.20 (*)    All other components within normal limits  CULTURE, BLOOD (ROUTINE X 2)  CULTURE, BLOOD (ROUTINE X 2)  RESP PANEL BY RT-PCR (FLU A&B, COVID) ARPGX2  URINE CULTURE  LACTIC ACID, PLASMA  LACTIC ACID, PLASMA  URINALYSIS, ROUTINE W REFLEX MICROSCOPIC  PROTIME-INR  APTT   EKG None  Radiology DG Chest 2 View  Result Date: 12/14/2021 CLINICAL DATA:  Altered mental status EXAM: CHEST - 2 VIEW COMPARISON:  12/26/2020 FINDINGS: Transverse diameter of heart is in the upper limits of normal. There are no signs of pulmonary edema or focal pulmonary consolidation. There is no pleural effusion or pneumothorax. IMPRESSION: No active cardiopulmonary disease. Electronically Signed   By: Elmer Picker M.D.   On: 12/14/2021 14:04    Procedures .Critical Care  Performed by: Mickie Hillier, PA-C Authorized by: Mickie Hillier, PA-C   Critical care provider statement:    Critical care time (minutes):  40   Critical care was necessary to treat or prevent imminent or life-threatening deterioration of the following conditions:  Sepsis   Critical care was time spent personally by me on the following activities:  Development of treatment plan with patient or surrogate, discussions with consultants, discussions with primary provider, evaluation of patient's response to treatment, examination of patient,  interpretation of cardiac output measurements, obtaining history from patient or surrogate, review of old charts, re-evaluation of patient's condition, pulse oximetry, ordering and review of radiographic studies, ordering and review of laboratory studies and ordering and performing treatments and interventions   I assumed direction of critical care for this patient from another provider in my specialty: no      Medications Ordered in ED Medications  lactated ringers infusion (has no administration in time range)  lactated ringers bolus 1,000 mL (has no administration in time range)    And  lactated ringers bolus 1,000 mL (has no administration in time range)    And  lactated ringers bolus 1,000 mL (has no administration in time range)  ceFEPIme (MAXIPIME) 2 g in sodium chloride 0.9 % 100 mL IVPB (has no administration in time range)  metroNIDAZOLE (FLAGYL) IVPB 500 mg (has no administration in time range)  vancomycin (VANCOREADY) IVPB 1750 mg/350 mL (has no administration in time range)    ED Course/ Medical Decision Making/ A&P                           Medical Decision Making Amount and/or Complexity of Data Reviewed Labs: ordered. Radiology: ordered.  Risk Prescription drug management.  This patient presents to the ED with chief complaint(s) of altered mental status with pertinent past medical history of paraplegia, suprapubic catheter, diabetes, hypertension which further complicates the presenting complaint. The complaint involves an extensive differential diagnosis and treatment options and also carries with it a high risk of complications and morbidity.    The differential diagnosis includes sepsis, stroke, CNS infection, meningitis, seizure, TIA, hypoglycemia, electrolyte derangement, arrhythmia or ACS, toxidrome or ingestion  Additional history obtained: Additional history obtained from family and spouse Records reviewed Care Everywhere/External Records and Primary Care  Documents most recent ED admission physician note.  Most  recent wound care physician note  ED Course: Lab Tests: I Ordered, and personally interpreted labs.  The pertinent results include:   CBC with leukocytosis to 20 CMP with mildly increased glucose to 128.  Creatinine is 1.87 which appears very mildly elevated from previous. Lactic negative Blood cultures pending COVID pending UA and urine culture pending Imaging Studies: I ordered and independently visualized and interpreted the following imaging X-ray chest   which showed no acute abnormalities The interpretation of the imaging was limited to assessing for emergent pathology, for which purpose it was ordered. Cardiac Monitoring: The patient was maintained on a cardiac monitor.  I personally viewed and interpreted the cardiac monitor which showed an underlying rhythm of:  sinus tachycardia Medicines ordered and prescription drug management: I ordered the following medications LR infusion, cefepime, vancomycin, Flagyl for sepsis I considered this additional medications: n/a Reevaluation of the patient after these medicines showed that the patient    stayed the same  Reassessment and review  71 year old female who presents to the emergency department with altered mental status. Physical exam with lethargic patient.  She is disoriented.  She is tachycardic without hypoxia or hypotension.  Notable for suprapubic catheter that does not have any drainage or redness surrounding it.  She does have some foul-smelling bilateral ischial wounds. She has a septic presentation and thus sepsis orders were placed including LR weight-based bolus and vancomycin, cefepime and Flagyl. Chest x-ray without pneumonia  Initial labs with leukocytosis to 20.  Lactic is negative.  Very mild bump in her creatinine but no severe electrolyte derangement, transaminitis.  Blood cultures are pending, COVID and flu pending, UA and urine culture pending.  She also  takes oxycodone at home.  She is drowsy.  Question opioid-induced altered mental status.  She is not obtunded requiring Narcan at this time.  Will monitor.  Care of patient being handed off to [facility], PA-C at this time. Patient will need admission for sepsis. At time of hand off pending urine, covid, lab work up completion. She may need further imaging of her pelvis for r/o worsening osteo if there is not other source of infection found. Please see his note for ongoing management.  Final diagnoses:  None    Rx / DC Orders ED Discharge Orders     None         Mickie Hillier, PA-C 12/14/21 1504    Rancour, Annie Main, MD 12/15/21 [phone]

## 2021-12-15 ENCOUNTER — Inpatient Hospital Stay (HOSPITAL_COMMUNITY): Payer: Medicare Other

## 2021-12-15 DIAGNOSIS — A419 Sepsis, unspecified organism: Secondary | ICD-10-CM

## 2021-12-15 DIAGNOSIS — G9341 Metabolic encephalopathy: Secondary | ICD-10-CM | POA: Diagnosis not present

## 2021-12-15 LAB — GLUCOSE, CAPILLARY
Glucose-Capillary: 117 mg/dL — ABNORMAL HIGH (ref 70–99)
Glucose-Capillary: 108 mg/dL — ABNORMAL HIGH (ref 70–99)
Glucose-Capillary: 100 mg/dL — ABNORMAL HIGH (ref 70–99)
Glucose-Capillary: 182 mg/dL — ABNORMAL HIGH (ref 70–99)

## 2021-12-15 LAB — CBC
HCT: 27.8 % — ABNORMAL LOW (ref 36.0–46.0)
Hemoglobin: 8.7 g/dL — ABNORMAL LOW (ref 12.0–15.0)
MCH: 28.5 pg (ref 26.0–34.0)
MCHC: 31.3 g/dL (ref 30.0–36.0)
MCV: 91.1 fL (ref 80.0–100.0)
Platelets: 410 10*3/uL — ABNORMAL HIGH (ref 150–400)
RBC: 3.05 MIL/uL — ABNORMAL LOW (ref 3.87–5.11)
RDW: 14.6 % (ref 11.5–15.5)
WBC: 19 10*3/uL — ABNORMAL HIGH (ref 4.0–10.5)
nRBC: 0 % (ref 0.0–0.2)

## 2021-12-15 LAB — IRON AND TIBC
Iron: 9 ug/dL — ABNORMAL LOW (ref 28–170)
Saturation Ratios: 4 % — ABNORMAL LOW (ref 10.4–31.8)
TIBC: 221 ug/dL — ABNORMAL LOW (ref 250–450)
UIBC: 212 ug/dL

## 2021-12-15 LAB — FERRITIN: Ferritin: 370 ng/mL — ABNORMAL HIGH (ref 11–307)

## 2021-12-15 LAB — BASIC METABOLIC PANEL
Anion gap: 12 (ref 5–15)
BUN: 63 mg/dL — ABNORMAL HIGH (ref 8–23)
CO2: 22 mmol/L (ref 22–32)
Calcium: 8.9 mg/dL (ref 8.9–10.3)
Chloride: 102 mmol/L (ref 98–111)
Creatinine, Ser: 1.81 mg/dL — ABNORMAL HIGH (ref 0.44–1.00)
GFR, Estimated: 29 mL/min — ABNORMAL LOW (ref >60)
Glucose, Bld: 127 mg/dL — ABNORMAL HIGH (ref 70–99)
Potassium: 4.4 mmol/L (ref 3.5–5.1)
Sodium: 136 mmol/L (ref 135–145)

## 2021-12-15 LAB — PROTIME-INR
INR: 1.2 (ref 0.8–1.2)
Prothrombin Time: 15.5 seconds — ABNORMAL HIGH (ref 11.4–15.2)

## 2021-12-15 LAB — APTT: aPTT: 36 seconds (ref 24–36)

## 2021-12-15 LAB — HEMOGLOBIN A1C
Hgb A1c MFr Bld: 5.4 % (ref 4.8–5.6)
Mean Plasma Glucose: 108.28 mg/dL

## 2021-12-15 LAB — LACTIC ACID, PLASMA
Lactic Acid, Venous: 3 mmol/L (ref 0.5–1.9)
Lactic Acid, Venous: 1 mmol/L (ref 0.5–1.9)

## 2021-12-15 MED ORDER — ORAL CARE MOUTH RINSE: 15.0000 mL | OROMUCOSAL | Status: DC | PRN

## 2021-12-15 MED ORDER — LACTATED RINGERS IV SOLN: INTRAVENOUS | Status: AC

## 2021-12-15 MED ORDER — METOPROLOL TARTRATE 100 MG PO TABS
100.0000 mg | ORAL_TABLET | Freq: Every day | ORAL | Status: DC
  Administered 2021-12-15 – 2021-12-17 (×3): 100 mg via ORAL
  Filled 2021-12-15 (×3): qty 1

## 2021-12-15 MED ORDER — LEVOTHYROXINE SODIUM 25 MCG PO TABS
125.0000 ug | ORAL_TABLET | Freq: Every day | ORAL | Status: DC
  Administered 2021-12-15 – 2021-12-17 (×3): 125 ug via ORAL
  Filled 2021-12-15 (×3): qty 1

## 2021-12-15 MED ORDER — DULOXETINE HCL 60 MG PO CPEP
60.0000 mg | ORAL_CAPSULE | Freq: Every day | ORAL | Status: DC
  Administered 2021-12-15 – 2021-12-17 (×3): 60 mg via ORAL
  Filled 2021-12-15 (×3): qty 1

## 2021-12-15 MED ORDER — GADOBUTROL 1 MMOL/ML IV SOLN
8.4000 mL | Freq: Once | INTRAVENOUS | Status: AC | PRN
Start: 2021-12-15 — End: 2021-12-15
  Administered 2021-12-15: 8.4 mL via INTRAVENOUS

## 2021-12-15 MED ORDER — SENNA 8.6 MG PO TABS
2.0000 | ORAL_TABLET | Freq: Every day | ORAL | Status: DC
  Administered 2021-12-15 – 2021-12-16 (×2): 17.2 mg via ORAL
  Filled 2021-12-15 (×2): qty 2

## 2021-12-15 NOTE — Progress Notes (Signed)
Subjective: Patient evaluated at bedside. States she is feeling fine today. Thinks she had surgery yesterday.  Reports she is unsure why she need to be in the hospital. Does have some pain near suprapubic area. Otherwise denies denies any acute complaints.  Objective:  Vital signs in last 24 hours: Vitals:   12/15/21 0000 12/15/21 0100 12/15/21 0310 12/15/21 0700  BP:  (!) 117/58 (!) 127/58 118/62  Pulse: (!) 104 (!) 110 (!) 116 (!) 115  Resp: '13 11 14 18  '$ Temp:   99 F (37.2 C)   TempSrc:   Oral   SpO2: 99% 99% 98%   Weight: 83.9 kg     Height:       Constitutional: Chronically ill appearing, in no acute distress HENT: EOMI, PERRLA, dry mucous membranes Cardiovascular: Tachycardia regular rhythm no murmurs, gallops, or rubs Respiratory: Lungs clear to auscultation of the anterior lung GI: Tenderness to palpation in the suprapubic area.  Skin: Warm and dry.  Diminished turgor Neurologic: Alert and oriented to person, place, month, somewhat confused about situation MSK: right foot in boot  Assessment/Plan:  Active Problems:   Sepsis ([place])  [name] is a 71 y.o. with PMH cervical myelopathy s/p cervical spine surgery 2006 which resulted in paraplegia, history of sacral ulcer complicated by chronic osteomyelitis, diabetes, neurogenic bladder s/p suprapubic catheter placement who presented with altered mental status and admitted for sepsis on hospital day 0   #Sepsis #Acute metabolic encephalopathy Patient presenting with 12 hour history of altered mental status. Mentation appears improved although she had some confusion with situation. States she had surgery yesterday, which she did not. Multiple sources of infection from chronic ischal wounds with chronic osteomyelitis and suprapubic catheter. Foley changed 9/8. Has history of pseudomonal/enterobacter UTI in 2020. Still tachycardic on exam today and appears dehydrated. Lactic acit improved from 3 to 1 after  IVF. -Continue vancomycin and cefepime  -LR at 100 mL/hr for 10 hours - follow up pending blood and urine cultures - monitor fever curve and CBC   #Osteomyelitis of left ischium #Chronic left decubitus ulcer Patient previously not interested in diverting ostomy or plastic surgery. - follow up MRI abdomen and pelvis to further characterize osteomyelitis - continue vancomycin, cefepime - Follow up blood cultures -goals of care, likely benefit from palliative support   #Bimalleolar Ankle fracture #Ulceration of lateral malleolus Seen by Dr. Jacqualyn Posey with podiatry on 8/31 no surgical intervention recommended given her bed bound and chronic medical problems. Repeat Xray with healing distal tibia and fibular fracture as well as subacute nondisplaced calcaneal fracture -wound care and monitor skin changes -continue immobilizer boot   #AKI on CKD stage IIIb Creatinine elevated at 1.87>1./81 follow fluids.  BUN 74>63. Baseline 1.2-1.5, with GFR in the low 30s. BUN elevated at 74.  No hydronephrosis on CT. Does have history of nonobstructive renal calculi. Still appears dehydrated on exam today. - IVF LR '@100'$  for 10 hours -Trend BMP -Avoid nephrotoxic medications   #Normocytic anemia Prior hemoglobin within normal limits.  Hemoglobin at 9.4 with MCV of 90.6. Ferritin 370,  iron 9, TIBC 211, saturation 4%. May reflect acute illness in addition iron deficiency. Iron replacement once infection treated. -Monitor CBC   #Thrombocythemia Platelets elevated at 423>410 in setting of sepsis. -CTM CBC   #Constipation CT showed moderate to large amount of stool. Likely due to chronic opoid use and paraplegia.  -Miralax twice daily, senna -suppository PRN   #HFpEF Home medications include lasix 20 mg BID.  Last echo with EF 45-50% 9/22. Appears dehydrated after 3L yesterday.  - Restart home metoprolol succinate 100 mg for tachycardia - hold lasix 20 mg BID - hold Nifedipine 30 mg for low normal  Bps - monitor intake and output and volume status   #Chronic pain syndrome -Percocet 7.5-325 mg BID -Pregabalin 100 mg BID -duloxetine 60 mg   #Hypothyroidism Continue levothyroxine 125 mc daily   #Type 2 diabetes Home medications include metformin 1000 BID. HgbA1c 5.4. -SSI - CBG monitoring   Diet: Carb-Modified VTE: Heparin IVF: none Code: Full Prior to Admission Living Arrangement: Home Anticipated Discharge Location: Pending Barriers to Discharge: Medical stability Dispo: Anticipated discharge in approximately 3 day(s).   Iona Beard, MD 12/15/2021, 7:38 AM Pager: [phone] After 5pm on weekdays and 1pm on weekends: On Call pager [phone]

## 2021-12-16 DIAGNOSIS — A419 Sepsis, unspecified organism: Secondary | ICD-10-CM | POA: Diagnosis not present

## 2021-12-16 DIAGNOSIS — G9341 Metabolic encephalopathy: Secondary | ICD-10-CM | POA: Diagnosis not present

## 2021-12-16 LAB — CBC
HCT: 24.9 % — ABNORMAL LOW (ref 36.0–46.0)
Hemoglobin: 7.9 g/dL — ABNORMAL LOW (ref 12.0–15.0)
MCH: 28.8 pg (ref 26.0–34.0)
MCHC: 31.7 g/dL (ref 30.0–36.0)
MCV: 90.9 fL (ref 80.0–100.0)
Platelets: 396 10*3/uL (ref 150–400)
RBC: 2.74 MIL/uL — ABNORMAL LOW (ref 3.87–5.11)
RDW: 14.9 % (ref 11.5–15.5)
WBC: 13.4 10*3/uL — ABNORMAL HIGH (ref 4.0–10.5)
nRBC: 0 % (ref 0.0–0.2)

## 2021-12-16 LAB — BASIC METABOLIC PANEL
Anion gap: 8 (ref 5–15)
BUN: 50 mg/dL — ABNORMAL HIGH (ref 8–23)
CO2: 23 mmol/L (ref 22–32)
Calcium: 9 mg/dL (ref 8.9–10.3)
Chloride: 108 mmol/L (ref 98–111)
Creatinine, Ser: 1.8 mg/dL — ABNORMAL HIGH (ref 0.44–1.00)
GFR, Estimated: 29 mL/min — ABNORMAL LOW (ref >60)
Glucose, Bld: 114 mg/dL — ABNORMAL HIGH (ref 70–99)
Potassium: 4.2 mmol/L (ref 3.5–5.1)
Sodium: 139 mmol/L (ref 135–145)

## 2021-12-16 LAB — GLUCOSE, CAPILLARY
Glucose-Capillary: 117 mg/dL — ABNORMAL HIGH (ref 70–99)
Glucose-Capillary: 111 mg/dL — ABNORMAL HIGH (ref 70–99)
Glucose-Capillary: 123 mg/dL — ABNORMAL HIGH (ref 70–99)
Glucose-Capillary: 108 mg/dL — ABNORMAL HIGH (ref 70–99)

## 2021-12-16 MED ORDER — LACTATED RINGERS IV SOLN: INTRAVENOUS | Status: AC

## 2021-12-16 MED ORDER — BISACODYL 10 MG RE SUPP
10.0000 mg | Freq: Every day | RECTAL | Status: DC
  Administered 2021-12-16: 10 mg via RECTAL
  Filled 2021-12-16 (×2): qty 1

## 2021-12-16 NOTE — Progress Notes (Signed)
PT Screen Note  Patient Details Name: [name] MRN: [id number] DOB: 07/31/1950   Cancelled Treatment:    Reason Eval/Treat Not Completed: PT screened, no needs identified, will sign off  Pt is total care, bed bound at baseline with paraplegia and sacral wound.  Pt is at her baseline and has no further PT needs.   Abran Richard, PT Acute Rehab [facility] [phone]   Karlton Lemon 12/16/2021, 3:43 PM

## 2021-12-16 NOTE — Consult Note (Addendum)
[facility] Nurse Consult Note: Reason for Consult: Consult requested for sacrum and bilat buttocks.  Pt states she has been followed by the outpatient wound center prior to admission and they have ordered moist gauze dressings. Sacrum with intact lighter-colored scar tissue from previous wound which has healed.  Left buttock with chronic Stage 4 pressure injury; located in a skin fold surrounding, 2X7X5 cm, 100% red and moist, mod amt yellow drainage, no odor, bone palpable when probed with a swab. Right buttock with chronic Stage 4 pressure injury; 7X1X3cm, located in a skin fold surrounding, 100% red and moist, mod amt yellow drainage, no odor, bone palpable when probed with a swab. Pressure Injury POA: Yes Dressing procedure/placement/frequency: Air mattress ordered for the bed to reduce pressure. Pt states she could use a new one at home, if the case manager can assist with this process. Continue with present plan of care as ordered by the wound care center: Apply moist gauze to left and right buttock wounds Q day, using swab to fill, then cover with foam dressing.  (Change foam dressings Q 3 days or PRN soiling.)  Please re-consult if further assistance is needed.  Thank-you,  Julien Girt MSN, [place], [place], [place], [place]

## 2021-12-16 NOTE — Progress Notes (Signed)
Subjective: Patient evaluated at bedside. Patient is having difficulty eating due to difficulty moving her hands. Discussed that stool burden is causing inflammation in her colon. Patient states that she takes oral dulcolax and senna at home. She states that she can go up to 1-2 weeks without having a bowel movement at home. Patient no longer experiences sensation of having a bowel movement. Discussed that WBC count is improving. Discussed pending blood and urine cultures. Patient states that her husband is having difficulty taking care of her at home because of another individual at home that he has been taking care of for over 20 years. Patient states that they have tried to get extra help at home from her PCP without any result. Discussed plan to speak with social work about getting extra help at home.    Objective:  Vital signs in last 24 hours: Vitals:   12/16/21 0200 12/16/21 0300 12/16/21 0400 12/16/21 0500  BP: 138/65 125/63 119/65 136/65  Pulse: (!) 111 (!) 109 (!) 109 (!) 108  Resp: '16 16 15 17  '$ Temp:      TempSrc:      SpO2: 95% 96% 97%   Weight:      Height:       Constitutional: Chronically ill appearing, in no acute distress HENT: EOMI, no scleral icterus,  dry mucous membranes Cardiovascular: Tachycardic, regular rhythm, no murmurs, gallops, or rubs Respiratory: Lungs clear to auscultation of the anterior lung, unable to auscultate posterior GI: Firm, no tenderness to palpation, dullness to percussion left side. Suprapubic catheter in place without surrounding edema or erythema.  Skin: Warm and dry.  Normal skin turgor Neurologic: Alert, responds appropriately to questions  Assessment/Plan:  Active Problems:   Sepsis ([place])  QUINISHA MOULD is a 71 y.o. with PMH cervical myelopathy s/p cervical spine surgery 2006 which resulted in paraplegia, history of sacral ulcer complicated by chronic osteomyelitis, diabetes, neurogenic bladder s/p suprapubic catheter placement  who presented with altered mental status and admitted for sepsis on hospital day 0   # Sepsis # Septic encephalopathy Patient presenting with 12 hour history of altered mental status.   Chronic osteo without acute changes, less likely to be source of infection. Bcx NGTD.  Endorsed suprapubic pain on admission. She has suprapubic catheter in place. Ucx was obtained growing GNR, though culture data will need to be interpreted judiciously since she has suprapubic catheter in place.  Has history of pseudomonal/enterobacter UTI in 2020.   Mentation appears to be baseline. Leukocytosis down-trending with abx. Remains tachycardic. - Vancomycin has been stopped. Will continue Cefepime for possible UTI.  - Continue to follow Bcx and UCx - IVFs - daily CBC  # Cervical Spinal injury/ Paraplegia with complications #Chronic left decubitus ulcer with Chronic Osteomyelitis of left ischium #Bimalleolar Ankle fracture with Ulceration of lateral malleolus Patient is cared for by husband. She has extensive home care needs. Her care is complicated by husband having to provide extensive care for second person in additional to patient. She reports husband is often overwhelmed with providing adequate care for her. There is concern that despite best efforts, patient likely not receiving adequate care due to her many medical problems. She adamantly refuses SNF, preferring to live at home. She reports that she has previously requested additional support from her PCP, but has not received any.   Patient reports she follows with wound care clinic, but expresses dissatisfaction since she has not been able to get Santyl recently. Fortunately, at this time,  her chronic wounds do not appear to be the source of her infection. - WOC - continue immobilizer boot - Consult TOC to help arrange for additional home support at home - May benefit from palliative consult as well to establish [place]  # Constipation # Stercoral colitis CT  showed moderate to large amount of stool, also noted on MRI. Likely due to chronic opoid use and paraplegia. Home regimen is Dulcolax PO and Senna. States suppositories typically ineffectual.  -Miralax twice daily, senna - scheduled suppository - Enema as needed     # AKI on CKD stage IIIb vs progression of CKD Creatinine 1.87 on admission.  Suprapubic foley cath in place for neurogenic bladder. Does have history of nonobstructive renal calculi. No hydronephrosis on CT.  Minimal improvement in Cr after IV fluid administration and antibiotics. Change compared to prior may reflect progression of underlying kidney disease.  -Daily BMP -Avoid nephrotoxic medications - I+Os  # Normocytic anemia Prior hemoglobin within normal limits.  Hemoglobin at 9.4 with MCV of 90.6. Ferritin 370,  iron 9, TIBC 221, saturation 4%. Likely reflect acute illness in addition to anemia of chronic disease.  -Monitor CBC   # Thrombocythemia - resolved Platelets elevated at 423>410 in setting of sepsis. - daily CBC  # HF with mildly reduced EF Home medications include lasix 20 mg BID. Last echo with EF 45-50% 9/22.  S/P 4L IV fluids - metoprolol succinate 100 mg - hold lasix 20 mg BID - hold Nifedipine 30 mg; BP has been normal - monitor intake and output and volume status   #Chronic pain syndrome -Percocet 7.5-325 mg BID  -Pregabalin 100 mg BID -duloxetine 60 mg  #Type 2 diabetes Home medications include metformin 1000 BID. HgbA1c 5.4. -SSI - CBG monitoring    Diet: Carb-Modified VTE: Heparin IVF: none Code: Full Prior to Admission Living Arrangement: Home Anticipated Discharge Location: Pending Barriers to Discharge: Medical stability Dispo: Anticipated discharge in approximately 3 day(s).   Delene Ruffini, MD

## 2021-12-17 ENCOUNTER — Other Ambulatory Visit (HOSPITAL_COMMUNITY): Payer: Self-pay

## 2021-12-17 DIAGNOSIS — G9341 Metabolic encephalopathy: Secondary | ICD-10-CM | POA: Diagnosis not present

## 2021-12-17 DIAGNOSIS — A419 Sepsis, unspecified organism: Secondary | ICD-10-CM | POA: Diagnosis not present

## 2021-12-17 LAB — CBC
HCT: 25.9 % — ABNORMAL LOW (ref 36.0–46.0)
Hemoglobin: 8.1 g/dL — ABNORMAL LOW (ref 12.0–15.0)
MCH: 28.3 pg (ref 26.0–34.0)
MCHC: 31.3 g/dL (ref 30.0–36.0)
MCV: 90.6 fL (ref 80.0–100.0)
Platelets: 431 10*3/uL — ABNORMAL HIGH (ref 150–400)
RBC: 2.86 MIL/uL — ABNORMAL LOW (ref 3.87–5.11)
RDW: 14.5 % (ref 11.5–15.5)
WBC: 12.9 10*3/uL — ABNORMAL HIGH (ref 4.0–10.5)
nRBC: 0 % (ref 0.0–0.2)

## 2021-12-17 LAB — BASIC METABOLIC PANEL
Anion gap: 7 (ref 5–15)
BUN: 30 mg/dL — ABNORMAL HIGH (ref 8–23)
CO2: 25 mmol/L (ref 22–32)
Calcium: 9 mg/dL (ref 8.9–10.3)
Chloride: 110 mmol/L (ref 98–111)
Creatinine, Ser: 1.48 mg/dL — ABNORMAL HIGH (ref 0.44–1.00)
GFR, Estimated: 37 mL/min — ABNORMAL LOW (ref >60)
Glucose, Bld: 114 mg/dL — ABNORMAL HIGH (ref 70–99)
Potassium: 4.1 mmol/L (ref 3.5–5.1)
Sodium: 142 mmol/L (ref 135–145)

## 2021-12-17 LAB — GLUCOSE, CAPILLARY
Glucose-Capillary: 108 mg/dL — ABNORMAL HIGH (ref 70–99)
Glucose-Capillary: 135 mg/dL — ABNORMAL HIGH (ref 70–99)

## 2021-12-17 MED ORDER — SENNA 8.6 MG PO TABS
2.0000 | ORAL_TABLET | Freq: Every day | ORAL | 0 refills | Status: AC
  Filled 2021-12-17: qty 60, 30d supply, fill #0

## 2021-12-17 MED ORDER — SODIUM CHLORIDE 0.9 % IV SOLN
2.0000 g | Freq: Two times a day (BID) | INTRAVENOUS | Status: DC
  Administered 2021-12-17: 2 g via INTRAVENOUS
  Filled 2021-12-17: qty 12.5

## 2021-12-17 MED ORDER — POLYETHYLENE GLYCOL 3350 17 GM/SCOOP PO POWD
17.0000 g | Freq: Two times a day (BID) | ORAL | 0 refills | Status: AC
  Filled 2021-12-17: qty 238, 7d supply, fill #0

## 2021-12-17 MED ORDER — BISACODYL 10 MG RE SUPP
10.0000 mg | Freq: Every day | RECTAL | 0 refills | Status: DC | PRN
  Filled 2021-12-17: qty 12, 12d supply, fill #0

## 2021-12-17 NOTE — Care Management Important Message (Signed)
Important Message  Patient Details  Name: [name] MRN: [id number] Date of Birth: Aug 03, 1950   Medicare Important Message Given:  Yes     Shelda Altes 12/17/2021, 8:31 AM

## 2021-12-17 NOTE — Progress Notes (Signed)
Pharmacy Antibiotic Note [name] is a 71 y.o. female for which pharmacy consulted on 12/14/21 for cefepime and vancomycin.  Patient with a history of HTN, paraplegia, DM, neurogenic bladder s/p suprapubic catheter. Patient presented with AMS.  Vancomycin discontinued on 12/16/21.   Cefepime continues for sepsis due to UTI.  Leukocytosis down-trending with abx.  Afebrile.  Urine culture grew GNRs  prvodencia rettgeri, pseudononas , sensitivities pending. Blood cultures no growth to date.  SCr down to 1.48, CrCl 41 ml/min.  I will adjust cefepime dose for improved renal function.    Plan: Adjust Cefepime dose to 2g every 12 hours, for total 7 days. Monitor clinical status, renal function and culture results daily.     Height: '5\' 3"'$  (160 cm) Weight: 114.5 kg (252 lb 6.4 oz) IBW/kg (Calculated) : 52.4  Temp (24hrs), Avg:98.4 F (36.9 C), Min:98.2 F (36.8 C), Max:98.6 F (37 C)  Recent Labs  Lab 12/14/21 1336 12/15/21 0048 12/15/21 0527 12/16/21 0415 12/17/21 0824  WBC 20.2* 19.0*  --  13.4* 12.9*  CREATININE 1.87* 1.81*  --  1.80* 1.48*  LATICACIDVEN 0.7 3.0* 1.0  --   --      Estimated Creatinine Clearance: 41.3 mL/min (A) (by C-G formula based on SCr of 1.48 mg/dL (H)).    Allergies  Allergen Reactions   Ivp Dye [Iodinated Contrast Media] Other (See Comments)    "hot and sweaty and almost passed out"   Aspirin Hives   Morphine And Related Itching and Rash   Sulfa Antibiotics Hives    Antimicrobials this admission: cefepime 9/9 >> (9/15)  vancomycin 9/9 >>9/11 flagyl 9/9 x1  Microbiology results: 9/9- Ucx >100k col GNR,  prvodencia rettgeri, pseudononas , sens pending 9/10 blood cx x2: ngtd x2d  9/9  Covid PCR not detected/ flu pcr: not detected  Thank you for allowing pharmacy to be a part of this patient's care.  Nicole Cella, RPh Clinical Pharmacist [phone] 12/17/2021 12:22 PM Please check AMION for all [place] phone numbers After 10:00 PM,  call [place] [phone]

## 2021-12-17 NOTE — TOC Transition Note (Signed)
Transition of Care [facility]) - CM/SW Discharge Note   Patient Details  Name: [name] MRN: [id number] Date of Birth: 10-01-1950  Transition of Care [facility]) CM/SW Contact:  Zenon Mayo, RN Phone Number: 12/17/2021, 12:24 PM   Clinical Narrative:    Patient is for dc today, she is active with [facility], ( wound care and supra pubic cath)  will add [place], which they use for the [place]. NCM confirmed with Amy with [facility]. Soc will begin tomorrow.  She states they do not have aides but the [facility] will do the same thing as an aide.  Spouse states he has a Lucianne Lei that he will take patient home in today.      Final next level of care: [place] Barriers to Discharge: No Barriers Identified   Patient Goals and CMS Choice Patient states their goals for this hospitalization and ongoing recovery are:: return home with spouse CMS Medicare.gov Compare Post Acute Care list provided to:: Patient Represenative (must comment) Choice offered to / list presented to : Spouse  Discharge Placement                       Discharge Plan and Services                  DME Agency: NA       HH Arranged: RN, OT [facility] Agency: [place] Date [facility] Agency Contacted: 12/17/21 Time [place]: 1223 Representative spoke with at [place]: Amy  Social Determinants of Health ([place]) Interventions     Readmission Risk Interventions     No data to display

## 2021-12-17 NOTE — Discharge Instructions (Signed)
Ms. Rideout,   It was a pleasure taking care of you during this admission.  You were hospitalized for fatigue and confusion, which could be secondary to an urinary tract infection or severe constipation causing inflammation of the colon.  You have received 4 days of IV antibiotic which is adequate for the urinary tract infection.  We also treated your constipation with senna, MiraLAX and Dulcolax suppository.  Your sacral wound is stable and the imaging did not show any new infection.  Your mental status has improved significantly at discharge.  We ordered an air mattress to help taking the pressure off of your back.  The social worker will work on home health services to help with wound care.  For bowel regimen, please start taking Senna and MiraLAX daily as instructed.  I also prescribed as needed Dulcolax suppository if you do not have a bowel movement in 1-2 days.  I will set up an appointment to establish primary care doctor with the [facility] at [facility] in about 2 weeks.  The front desk will call you to set up the appointment.

## 2021-12-17 NOTE — Discharge Summary (Signed)
Name: [name] MRN: [id number] DOB: [birth year] 71 y.o. PCP: [name], MD  Date of Admission: 12/14/2021 12:31 PM Date of Discharge: 12/17/21 Attending Physician: Dr. Daryll Drown  Discharge Diagnosis: Active Problems:   Sepsis [facility])    Discharge Medications: Allergies as of 12/17/2021       Reactions   Ivp Dye [iodinated Contrast Media] Other (See Comments)   "hot and sweaty and almost passed out"   Aspirin Hives   Morphine And Related Itching, Rash   Sulfa Antibiotics Hives        Medication List     STOP taking these medications    polyethylene glycol 17 g packet Commonly known as: MIRALAX / GLYCOLAX Replaced by: polyethylene glycol powder 17 GM/SCOOP powder       TAKE these medications    baclofen 10 MG tablet Commonly known as: LIORESAL Take 10 mg by mouth 4 (four) times daily.   bisacodyl 10 MG suppository Commonly known as: DULCOLAX Place 1 suppository (10 mg total) rectally daily as needed for severe constipation.   DULoxetine 60 MG capsule Commonly known as: CYMBALTA Take 60 mg by mouth daily. What changed: Another medication with the same name was removed. Continue taking this medication, and follow the directions you see here.   folic acid 1 MG tablet Commonly known as: FOLVITE Take 1 mg by mouth daily.   furosemide 40 MG tablet Commonly known as: LASIX Take 20 mg by mouth daily.   levothyroxine 125 MCG tablet Commonly known as: SYNTHROID Take 1 tablet (125 mcg total) by mouth daily before breakfast. What changed: when to take this   metFORMIN 1000 MG tablet Commonly known as: GLUCOPHAGE Take 1,000 mg by mouth 2 (two) times daily.   metoprolol tartrate 100 MG tablet Commonly known as: LOPRESSOR Take 100 mg by mouth daily.   NIFEdipine 30 MG 24 hr tablet Commonly known as: ADALAT CC Take 30 mg by mouth daily.   Percocet 7.5-325 MG tablet Generic drug: oxyCODONE-acetaminophen Take 1 tablet by mouth 2 (two) times daily.    polyethylene glycol powder 17 GM/SCOOP powder Commonly known as: GLYCOLAX/MIRALAX Take 17 g by mouth 2 (two) times daily. Replaces: polyethylene glycol 17 g packet   pregabalin 100 MG capsule Commonly known as: LYRICA Take 100 mg by mouth 2 (two) times daily.   senna 8.6 MG Tabs tablet Commonly known as: SENOKOT Take 2 tablets (17.2 mg total) by mouth at bedtime.   Zinc Sulfate 220 (50 Zn) MG Tabs Take 50 mg by mouth daily.               Discharge Care Instructions  (From admission, onward)           Start     Ordered   12/17/21 0000  Discharge wound care:       Comments: Per wound care order above   12/17/21 1137            Disposition and follow-up:   [name] was discharged from [facility] in Stable condition.  At the hospital follow up visit please address:  1.  Follow-up:  a. Fatigue/confusion - UTI vs stercoral colitis. Chronic wounds likely not cause. Received 4 days Cefepime for UTI. Miralax, senna, dulcolax suppository provided for BM. Mental status improved after UTI treatment and bowel movement. Ensure no recurrence of urinary symptoms, and having more regular BM at follow up.     b. Paraplegia - Patient needing additional assistance at home. Ensure  air mattress delivered. [facility] services ordered, ensure these are being set up.    2.  Labs / imaging needed at time of follow-up: BMP, CBC  3.  Pending labs/ test needing follow-up: none  4.  Medication Changes  Started: Dulcolax suppository PRN  Follow-up Appointments:  Follow-up Information     [name], MD Follow up.   Specialty: Internal Medicine Why: Please follow up in a week. Contact information: [place] [address] [phone]         Leonie Man, MD .   Specialty: Cardiology Contact information: [address] [place] [address] [phone]         [facility] Follow up.   Why: HHRN, [facility] will call you to set up apt times Contact information: [place].  [facility] Course by problem list:   Sepsis Septic encephalopathy Patient presented with 12 hr history of acute encephalopathy. She had imaging performed of her chronic wounds which did not show any acute changes. A significant stool burden with evidence of surrounding inflammation was noted on imaging of her abd and pelvis. She was treated with bowel regimen and dulcolax suppository with bowel movement. Blood cultures were collected on admission but remained negative and Vanc was discontinued. Additionally, on admission, she was also noted to have some suprapubic tenderness. Ucx was collected and grew pseudomonas and providenci. She had been started on Cefepime on admission with improvement in mentation and leukocytosis. She appeared ot have clinically improved with return to [place] following 4 days of abx administration and bowel movement and was felt stable for discharge back home.  Cervical spine injury with chronic wounds Patient admitted with chronic wounds and osteomyelitis from c-spine injury in 2006. Per discussions with her, she is cared for at home by her husband, however, husband also attempting to care for additional person at home who also requires significant help. Patient states she thinks her husband is overwhelmed. She adamantly refused to go to SNF. Patient was evaluated by PT, no new needs identified. Patient will be discharged home with [facility] RN and OT as well as air mattress to offload wounds.    Discharge Exam:   BP (!) 158/75 (BP Location: Right Arm)   Pulse (!) 113   Temp 98.2 F (36.8 C) (Oral)   Resp 18   Ht '5\' 3"'$  (1.6 m)   Wt 114.5 kg   SpO2 96%   BMI 44.71 kg/m  Constitutional: looks comfortable, NAD Cardiovascular: tachycardic Pulmonary/Chest: normal work of breathing on room air, symmetric chest  rise Abdominal: non-peritonitic Neurological: alert responds appropriately Skin: warm and dry Psych: mood and affect appropriate   Pertinent Labs, Studies, and Procedures:     Latest Ref Rng & Units 12/17/2021    8:24 AM 12/16/2021    4:15 AM 12/15/2021   12:48 AM  CBC  WBC 4.0 - 10.5 K/uL 12.9  13.4  19.0   Hemoglobin 12.0 - 15.0 g/dL 8.1  7.9  8.7   Hematocrit 36.0 - 46.0 % 25.9  24.9  27.8   Platelets 150 - 400 K/uL 431  396  410        Latest Ref Rng & Units 12/17/2021    8:24 AM 12/16/2021    4:15 AM 12/15/2021   12:48 AM  CMP  Glucose 70 -  99 mg/dL 114  114  127   BUN 8 - 23 mg/dL 30  50  63   Creatinine 0.44 - 1.00 mg/dL 1.48  1.80  1.81   Sodium 135 - 145 mmol/L 142  139  136   Potassium 3.5 - 5.1 mmol/L 4.1  4.2  4.4   Chloride 98 - 111 mmol/L 110  108  102   CO2 22 - 32 mmol/L '25  23  22   '$ Calcium 8.9 - 10.3 mg/dL 9.0  9.0  8.9     MR PELVIS W WO CONTRAST  Result Date: 12/15/2021 CLINICAL DATA:  Paraplegia, chronic ulcers chronic suprapubic catheter. Sepsis encephalopathy EXAM: MRI ABDOMEN AND PELVIS WITHOUT AND WITH CONTRAST TECHNIQUE: Multiplanar multisequence MR imaging of the abdomen and pelvis was performed both before and after the administration of intravenous contrast. CONTRAST:  8.31m GADAVIST GADOBUTROL 1 MMOL/ML IV SOLN COMPARISON:  Multiple priors including most recent CT abdomen and pelvis dated December 14, 2021. FINDINGS: Despite efforts by the technologist and patient, motion artifact is present predominantly on the pelvic imaging of today's exam and could not be eliminated. This reduces exam sensitivity and specificity. COMBINED FINDINGS FOR BOTH MR ABDOMEN AND PELVIS Lower chest: Cardiomegaly.  Trace right pleural effusion. Hepatobiliary: Hepatomegaly with the liver measuring 21.9 cm in craniocaudal dimension at the midclavicular line. Scattered benign hepatic cysts. No suspicious hepatic lesion. Cholelithiasis in a distended gallbladder without gallbladder  wall thickening or pericholecystic fluid. Biliary ductal dilation. Pancreas: No pancreatic ductal dilation or evidence of acute inflammation. Spleen:  No splenomegaly or focal splenic lesion. Adrenals/Urinary Tract:  Bilateral adrenal glands appear normal. No hydronephrosis.  Nonobstructive bilateral renal calculi. Bilateral fluid density renal lesions are consistent with cysts and considered benign requiring no independent imaging follow-up. Intrinsically T1 hyperintense 7 mm right upper pole renal lesion difficult to accurately evaluate given its small size and location but does not definitely demonstrate postcontrast diminished enhancement and is favored to reflect a benign hemorrhagic/proteinaceous cyst which is considered benign and requires no independent imaging follow-up. No solid enhancing renal mass identified. Urinary bladder is decompressed around a suprapubic catheter. Stomach/Bowel: Stomach is unremarkable for degree of distension. Moderate to large volume of formed stool throughout the colon. Prominent rectal stool burden with rectal wall thickening and some perirectal inflammation possibly reflecting stercoral colitis. Vascular/Lymphatic: No pathologically enlarged lymph nodes identified. Portal, splenic and superior mesenteric veins are patent. No abdominal aortic aneurysm demonstrated. Reproductive: Status post hysterectomy. No adnexal masses. Other: No significant abdominopelvic free fluid. No walled off fluid collections. Musculoskeletal: Chronic soft tissue defect/pressure ulceration in the left gluteal subcutaneous soft tissues with underlying chronic osteomyelitis with mild reactive osteitis, no overt findings of acute osteomyelitis. IMPRESSION: Motion degraded examination of the pelvis, within this context: 1. Changes of chronic osteomyelitis with mild reactive osteitis underlying the site of decubitus ulceration, no overt findings of acute osteomyleitis at this time and no drainable fluid  collection. 2. Cholelithiasis in a distended gallbladder without other findings of acute cholecystitis. 3. Moderate large volume of stool throughout the colon and rectum with mild rectal wall thickening and perirectal inflammation, suggestive of constipation with stercoral colitis. 4. Hepatomegaly of indeterminate clinical significance. 5. Hepatomegaly with a trace right pleural effusion. 6. Urinary bladder is decompressed around a suprapubic catheter Electronically Signed   By: JDahlia BailiffM.D.   On: 12/15/2021 17:55   MR ABDOMEN W WO CONTRAST  Result Date: 12/15/2021 CLINICAL DATA:  Paraplegia, chronic ulcers chronic suprapubic catheter. Sepsis  encephalopathy EXAM: MRI ABDOMEN AND PELVIS WITHOUT AND WITH CONTRAST TECHNIQUE: Multiplanar multisequence MR imaging of the abdomen and pelvis was performed both before and after the administration of intravenous contrast. CONTRAST:  8.57m GADAVIST GADOBUTROL 1 MMOL/ML IV SOLN COMPARISON:  Multiple priors including most recent CT abdomen and pelvis dated December 14, 2021. FINDINGS: Despite efforts by the technologist and patient, motion artifact is present predominantly on the pelvic imaging of today's exam and could not be eliminated. This reduces exam sensitivity and specificity. COMBINED FINDINGS FOR BOTH MR ABDOMEN AND PELVIS Lower chest: Cardiomegaly.  Trace right pleural effusion. Hepatobiliary: Hepatomegaly with the liver measuring 21.9 cm in craniocaudal dimension at the midclavicular line. Scattered benign hepatic cysts. No suspicious hepatic lesion. Cholelithiasis in a distended gallbladder without gallbladder wall thickening or pericholecystic fluid. Biliary ductal dilation. Pancreas: No pancreatic ductal dilation or evidence of acute inflammation. Spleen:  No splenomegaly or focal splenic lesion. Adrenals/Urinary Tract:  Bilateral adrenal glands appear normal. No hydronephrosis.  Nonobstructive bilateral renal calculi. Bilateral fluid density renal  lesions are consistent with cysts and considered benign requiring no independent imaging follow-up. Intrinsically T1 hyperintense 7 mm right upper pole renal lesion difficult to accurately evaluate given its small size and location but does not definitely demonstrate postcontrast diminished enhancement and is favored to reflect a benign hemorrhagic/proteinaceous cyst which is considered benign and requires no independent imaging follow-up. No solid enhancing renal mass identified. Urinary bladder is decompressed around a suprapubic catheter. Stomach/Bowel: Stomach is unremarkable for degree of distension. Moderate to large volume of formed stool throughout the colon. Prominent rectal stool burden with rectal wall thickening and some perirectal inflammation possibly reflecting stercoral colitis. Vascular/Lymphatic: No pathologically enlarged lymph nodes identified. Portal, splenic and superior mesenteric veins are patent. No abdominal aortic aneurysm demonstrated. Reproductive: Status post hysterectomy. No adnexal masses. Other: No significant abdominopelvic free fluid. No walled off fluid collections. Musculoskeletal: Chronic soft tissue defect/pressure ulceration in the left gluteal subcutaneous soft tissues with underlying chronic osteomyelitis with mild reactive osteitis, no overt findings of acute osteomyelitis. IMPRESSION: Motion degraded examination of the pelvis, within this context: 1. Changes of chronic osteomyelitis with mild reactive osteitis underlying the site of decubitus ulceration, no overt findings of acute osteomyleitis at this time and no drainable fluid collection. 2. Cholelithiasis in a distended gallbladder without other findings of acute cholecystitis. 3. Moderate large volume of stool throughout the colon and rectum with mild rectal wall thickening and perirectal inflammation, suggestive of constipation with stercoral colitis. 4. Hepatomegaly of indeterminate clinical significance. 5.  Hepatomegaly with a trace right pleural effusion. 6. Urinary bladder is decompressed around a suprapubic catheter Electronically Signed   By: JDahlia BailiffM.D.   On: 12/15/2021 17:55   DG Foot 2 Views Right  Result Date: 12/15/2021 CLINICAL DATA:  Possible ankle fracture. EXAM: RIGHT FOOT - 2 VIEW COMPARISON:  Right ankle 12/05/2021 FINDINGS: Only a single lateral view of the right foot could be obtained. There is diffuse decreased bone mineralization. Evidence of patient's known healing distal tibia and fibular metaphyseal fractures. No definite acute fracture identified. Vertical linear sclerosis over the posterior calcaneus which could represent a healing/subacute nondisplaced fracture. Degenerative changes throughout the foot and ankle. Inferior calcaneal spur. IMPRESSION: 1. Evidence of patient's known healing distal tibia and fibular metaphyseal fractures. No definite acute fracture. 2. Vertical linear sclerosis over the posterior calcaneus which may represent a healing/subacute nondisplaced fracture. Electronically Signed   By: DMarin OlpM.D.   On: 12/15/2021 08:51  CT Abdomen Pelvis Wo Contrast  Result Date: 12/14/2021 CLINICAL DATA:  71 year old female with sepsis. EXAM: CT ABDOMEN AND PELVIS WITHOUT CONTRAST TECHNIQUE: Multidetector CT imaging of the abdomen and pelvis was performed following the standard protocol without IV contrast. RADIATION DOSE REDUCTION: This exam was performed according to the departmental dose-optimization program which includes automated exposure control, adjustment of the mA and/or kV according to patient size and/or use of iterative reconstruction technique. COMPARISON:  12/27/2020 CT FINDINGS: Please note that parenchymal and vascular abnormalities may be missed as intravenous contrast was not administered. Lower chest: No acute abnormality.  Cardiomegaly noted. Hepatobiliary: The liver is unchanged. No significant hepatic abnormalities are present. Cholelithiasis  noted without CT evidence of acute cholecystitis. There is no evidence of intrahepatic or extrahepatic biliary dilatation. Pancreas: Unremarkable Spleen: The spleen is unremarkable except for unchanged small calcifications/granulomas. Adrenals/Urinary Tract: A 5 mm calculus within the dependent LEFT aspect of the bladder is noted. There is no evidence of hydronephrosis. Nonobstructing bilateral renal calculi are identified with 2 adjacent 6 mm calculi in the RIGHT LOWER pole and a punctate calculus within the LEFT LOWER pole. No obstructing urinary calculi are identified. A LEFT renal cyst is unchanged and no imaging follow-up is recommended. A suprapubic catheter within the bladder is noted. The adrenal glands are unremarkable. Stomach/Bowel: There is no evidence of bowel obstruction, definite bowel wall thickening or inflammatory changes. A moderate to large amount of stool within the colon and rectum noted. Vascular/Lymphatic: Aortic atherosclerosis. No enlarged abdominal or pelvic lymph nodes. Reproductive: Status post hysterectomy. No adnexal masses. Other: No ascites, focal collection or pneumoperitoneum. Musculoskeletal: A LEFT decubitus ulcer is again noted extending to the posterior LEFT ischium which is sclerotic, compatible with reactive changes/chronic osteomyelitis. No new bony abnormalities are identified. Degenerative changes in the lumbar spine and hips again noted. IMPRESSION: 1. No evidence of acute abnormality. 2. LEFT decubitus ulcer extending to unchanged sclerotic posterior LEFT ischium, compatible with reactive changes/chronic osteomyelitis. 3. Nonobstructing bilateral renal calculi and 5 mm bladder calculus. No evidence of hydronephrosis. 4. Cholelithiasis without CT evidence of acute cholecystitis. 5. Moderate to large amount of stool within the colon and rectum. 6. Cardiomegaly. 7. Aortic Atherosclerosis (ICD10-I70.0). Electronically Signed   By: Margarette Canada M.D.   On: 12/14/2021 17:18    DG Chest 2 View  Result Date: 12/14/2021 CLINICAL DATA:  Altered mental status EXAM: CHEST - 2 VIEW COMPARISON:  12/26/2020 FINDINGS: Transverse diameter of heart is in the upper limits of normal. There are no signs of pulmonary edema or focal pulmonary consolidation. There is no pleural effusion or pneumothorax. IMPRESSION: No active cardiopulmonary disease. Electronically Signed   By: Elmer Picker M.D.   On: 12/14/2021 14:04     Discharge Instructions: Discharge Instructions     Call MD for:  persistant nausea and vomiting   Complete by: As directed    Call MD for:  redness, tenderness, or signs of infection (pain, swelling, redness, odor or green/yellow discharge around incision site)   Complete by: As directed    Call MD for:  severe uncontrolled pain   Complete by: As directed    Call MD for:  temperature >100.4   Complete by: As directed    Diet - low sodium heart healthy   Complete by: As directed    Discharge instructions   Complete by: As directed    Ms. Fenster,   It was a pleasure taking care of you during this admission.  You were hospitalized  for fatigue and confusion, which could be secondary to an urinary tract infection or severe constipation causing inflammation of the colon.  You have received 4 days of IV antibiotic which is adequate for the urinary tract infection.  We also treated your constipation with senna, MiraLAX and Dulcolax suppository.  Your sacral wound is stable and the imaging did not show any new infection.  Your mental status has improved significantly at discharge.  We ordered an air mattress to help taking the pressure off of your back.  The social worker will work on home health services to help with wound care.  For bowel regimen, please start taking Senna and MiraLAX daily as instructed.  I also prescribed as needed Dulcolax suppository if you do not have a bowel movement in 1-2 days.  I will set up an appointment to establish primary care  doctor with the [facility] Medicine [facility] at [facility] in about 2 weeks.  The front desk will call you to set up the appointment.  Take care   Discharge wound care:   Complete by: As directed    Per wound care order above   Increase activity slowly   Complete by: As directed        Signed: Delene Ruffini, MD 12/17/2021, 2:15 PM   Pager: [phone]

## 2021-12-18 LAB — URINE CULTURE
Culture: >=100000 — AB
Special Requests: NORMAL

## 2021-12-20 ENCOUNTER — Telehealth: Payer: Self-pay

## 2021-12-20 ENCOUNTER — Ambulatory Visit (INDEPENDENT_AMBULATORY_CARE_PROVIDER_SITE_OTHER): Payer: Medicare Other

## 2021-12-20 ENCOUNTER — Ambulatory Visit: Payer: Medicare Other | Admitting: Podiatry

## 2021-12-20 DIAGNOSIS — S82891A Other fracture of right lower leg, initial encounter for closed fracture: Secondary | ICD-10-CM

## 2021-12-20 DIAGNOSIS — L97312 Non-pressure chronic ulcer of right ankle with fat layer exposed: Secondary | ICD-10-CM

## 2021-12-20 DIAGNOSIS — S82891D Other fracture of right lower leg, subsequent encounter for closed fracture with routine healing: Secondary | ICD-10-CM | POA: Diagnosis not present

## 2021-12-20 DIAGNOSIS — I739 Peripheral vascular disease, unspecified: Secondary | ICD-10-CM | POA: Diagnosis not present

## 2021-12-20 LAB — CULTURE, BLOOD (ROUTINE X 2)
Culture: NO GROWTH
Culture: NO GROWTH

## 2021-12-20 MED ORDER — DOXYCYCLINE HYCLATE 100 MG PO TABS: 100.0000 mg | ORAL_TABLET | Freq: Two times a day (BID) | ORAL | 0 refills | Status: DC

## 2021-12-20 NOTE — Telephone Encounter (Signed)
Called Patient to let her know that Dr. Jacqualyn Posey put in a order to get a ABI with [facility]. Left a vm. Will try to call on Monday.

## 2021-12-23 NOTE — Progress Notes (Signed)
Subjective: Chief Complaint  Patient presents with   Fracture    Right foot fracture, wound on the lateral side of the ankle, a lot of bleeding, X-Rays done,     71 year old female presents the office with her husband for follow-up evaluation of right ankle fracture.  Since I saw her last she was admitted to the hospital for UTI.  The patient's husband states that while in the hospital they noticed some bleeding to the side of the ankle.  He reports that is not being bandaged.  She is remained in the cam boot.   Objective: AAO x3, NAD DP/PT pulses palpable bilaterally, CRT less than 3 seconds Upon removal of the bandage to the lateral ankle there is a full-thickness ulceration noted to drain very concerned about likely there is no drainage or pus.  There is erythema or warmth.  Instability of the ankle noted. No pain with calf compression, swelling, warmth, erythema  Assessment: Full-thickness ulceration, ankle fracture  Plan: -All treatment options discussed with the patient including all alternatives, risks, complications.  -X-rays were obtained and reviewed.  Difficult to obtain x-rays.  Fracture noted to the tibia, fibula.  There are some air present on the lateral soft tissue with this corresponds to the wound.  No clinical signs of infection however I administer antibiotics.  Prescribed doxycycline. -Recommend Betadine with a dry dressing changes daily.  Offloading in the cam boot for now but consider likely cast next appointment.  I did not do this today as I wanted the dressing changes. -ABI ordered -I also placed referral to wound care center to see if they can help facilitate wound healing.  She is already going to the wound care center for other issues.-Very concerned about the outcome of this.  Plan for follow-up next week if there is any signs of infection is reported to emergency room. -Patient encouraged to call the office with any questions, concerns, change in symptoms.    Trula Slade DPM

## 2021-12-26 ENCOUNTER — Other Ambulatory Visit: Payer: Self-pay | Admitting: Podiatry

## 2021-12-26 DIAGNOSIS — L97312 Non-pressure chronic ulcer of right ankle with fat layer exposed: Secondary | ICD-10-CM

## 2021-12-27 ENCOUNTER — Ambulatory Visit: Payer: Medicare Other | Admitting: Podiatry

## 2021-12-27 DIAGNOSIS — L97312 Non-pressure chronic ulcer of right ankle with fat layer exposed: Secondary | ICD-10-CM | POA: Diagnosis not present

## 2021-12-27 DIAGNOSIS — S82891D Other fracture of right lower leg, subsequent encounter for closed fracture with routine healing: Secondary | ICD-10-CM | POA: Diagnosis not present

## 2021-12-30 ENCOUNTER — Telehealth: Payer: Self-pay | Admitting: *Deleted

## 2021-12-30 ENCOUNTER — Telehealth: Payer: Self-pay | Admitting: Podiatry

## 2021-12-30 ENCOUNTER — Other Ambulatory Visit: Payer: Self-pay | Admitting: Podiatry

## 2021-12-30 MED ORDER — DOXYCYCLINE HYCLATE 100 MG PO TABS: 100.0000 mg | ORAL_TABLET | Freq: Two times a day (BID) | ORAL | 0 refills | Status: DC

## 2021-12-30 NOTE — Telephone Encounter (Signed)
Pt's husband Mr. Therrell came into office today regarding pt out of meds doxycycline (VIBRA-TABS) 100 MG tablet [[id number]]. Per Jacqualyn Posey will send over until next follow up appt.

## 2021-12-30 NOTE — Telephone Encounter (Signed)
Patient's husband is requesting another round of antibiotics, has an upcoming appointment on the 29 th, please advise.

## 2021-12-31 ENCOUNTER — Telehealth: Payer: Self-pay | Admitting: *Deleted

## 2021-12-31 ENCOUNTER — Encounter (HOSPITAL_BASED_OUTPATIENT_CLINIC_OR_DEPARTMENT_OTHER): Payer: Medicare Other | Attending: General Surgery | Admitting: General Surgery

## 2021-12-31 DIAGNOSIS — Z87891 Personal history of nicotine dependence: Secondary | ICD-10-CM | POA: Insufficient documentation

## 2021-12-31 DIAGNOSIS — E11622 Type 2 diabetes mellitus with other skin ulcer: Secondary | ICD-10-CM | POA: Insufficient documentation

## 2021-12-31 DIAGNOSIS — L89324 Pressure ulcer of left buttock, stage 4: Secondary | ICD-10-CM | POA: Diagnosis present

## 2021-12-31 DIAGNOSIS — L89313 Pressure ulcer of right buttock, stage 3: Secondary | ICD-10-CM | POA: Insufficient documentation

## 2021-12-31 NOTE — Progress Notes (Addendum)
[name] ([id number]) [id number] Page 1 of 18 Visit Report for 12/31/2021 Chief Complaint Document Details Patient Name: Date of Service: [name], [name]. 12/31/2021 10:30 A M Medical Record Number: [id number] Patient Account Number: [id number] Date of Birth/Sex: Treating RN: [birth year] (71 y.o. F) Primary Care Provider: [name] Other Clinician: Referring Provider: Treating Provider/Extender: Nance Pew in Treatment: [place] from: Patient Chief Complaint She is here for follow up evaluation of left ischial pressure ulcer 12/30/2018; patient comes back for review of wounds in the same general area over the previously healed left ischial pressure ulcer Electronic Signature(s) Signed: 12/31/2021 11:21:24 AM By: Fredirick Maudlin MD FACS Entered By: Fredirick Maudlin on 12/31/2021 11:21:23 -------------------------------------------------------------------------------- Debridement Details Patient Name: Date of Service: [name]. 12/31/2021 10:30 A M Medical Record Number: [id number] Patient Account Number: [id number] Date of Birth/Sex: Treating RN: 1950/08/16 (71 y.o. [name], [name] Primary Care Provider: [name] Other Clinician: Referring Provider: Treating Provider/Extender: Nance Pew in Treatment: 156 Debridement Performed for Assessment: Wound #12 Left Ischial Tuberosity Performed By: Physician Fredirick Maudlin, MD Debridement Type: Debridement Level of Consciousness (Pre-procedure): Awake and Alert Pre-procedure Verification/Time Out Yes - 11:10 Taken: Start Time: 11:12 Pain Control: Lidocaine 4% T opical Solution T Area Debrided (L x W): otal 2.8 (cm) x 8.3 (cm) = 23.24 (cm) Tissue and other material debrided: Viable, Non-Viable, Slough, Subcutaneous, Biofilm, Slough Level: Skin/Subcutaneous Tissue Debridement Description: Excisional Instrument:  Curette Bleeding: Minimum Hemostasis Achieved: Pressure Procedural Pain: 0 Post Procedural Pain: 0 Response to Treatment: Procedure was tolerated well Level of Consciousness (Post- Awake and Alert procedure): Post Debridement Measurements of Total Wound Length: (cm) 2.8 Stage: Category/Stage IV Width: (cm) 8.3 Depth: (cm) 3.1 Volume: (cm) 56.583 Character of Wound/Ulcer Post Debridement: Improved Post Procedure Diagnosis Towers, [name] ([id number]) [id number] Page 2 of 18 Same as Pre-procedure Notes scribed by Baruch Gouty, RN for Dr. Celine Ahr Electronic Signature(s) Signed: 12/31/2021 12:12:30 PM By: Fredirick Maudlin MD FACS Signed: 12/31/2021 5:30:40 PM By: Baruch Gouty RN, BSN Entered By: Baruch Gouty on 12/31/2021 11:16:14 -------------------------------------------------------------------------------- Debridement Details Patient Name: Date of Service: [name]. 12/31/2021 10:30 A M Medical Record Number: [id number] Patient Account Number: [id number] Date of Birth/Sex: Treating RN: 07/31/1950 (71 y.o. [name], [name] Primary Care Provider: [name] Other Clinician: Referring Provider: Treating Provider/Extender: Nance Pew in Treatment: 156 Debridement Performed for Assessment: Wound #15 Right Ischium Performed By: Physician Fredirick Maudlin, MD Debridement Type: Debridement Level of Consciousness (Pre-procedure): Awake and Alert Pre-procedure Verification/Time Out Yes - 11:10 Taken: Start Time: 11:12 Pain Control: Lidocaine 4% T opical Solution T Area Debrided (L x W): otal 2 (cm) x 1.8 (cm) = 3.6 (cm) Tissue and other material debrided: Non-Viable, Slough, Biofilm, Slough Level: Non-Viable Tissue Debridement Description: Selective/Open Wound Instrument: Curette Bleeding: Minimum Hemostasis Achieved: Pressure Procedural Pain: 0 Post Procedural Pain: 0 Response to Treatment: Procedure was  tolerated well Level of Consciousness (Post- Awake and Alert procedure): Post Debridement Measurements of Total Wound Length: (cm) 2 Stage: Category/Stage III Width: (cm) 1.8 Depth: (cm) 1.8 Volume: (cm) 5.089 Character of Wound/Ulcer Post Debridement: Improved Post Procedure Diagnosis Same as Pre-procedure Notes scribed by Baruch Gouty, RN for Dr. Celine Ahr Electronic Signature(s) Signed: 12/31/2021 12:12:30 PM By: Fredirick Maudlin MD FACS Signed: 12/31/2021 5:30:40 PM By: Baruch Gouty RN, BSN Entered By: Baruch Gouty on 12/31/2021 11:16:36 HPI Details -------------------------------------------------------------------------------- [name] ([id number]) [id number] Page 3 of 18 Patient  Name: Date of Service: [name], [name] 12/31/2021 10:30 A M Medical Record Number: [id number] Patient Account Number: [id number] Date of Birth/Sex: Treating RN: 1950/10/23 (71 y.o. F) Primary Care Provider: [name] Other Clinician: Referring Provider: Treating Provider/Extender: Nance Pew in Treatment: 156 History of Present Illness Location: open ulceration of the left gluteal area, left heel and right ankle for about 5 months. Quality: Patient reports No Pain. Severity: Patient states wound(s) are getting worse. Duration: Patient has had the wound for > 5 months prior to seeking treatment at the wound center Context: The wound occurred when the patient has been paraplegic for about 3 years. Modifying Factors: Wound improving due to current treatment. ssociated Signs and Symptoms: Patient reports having foul odor. A HPI Description: this 71 year old patient who is known to have hypertension, hypothyroidism, breast cancer, chronic pain syndrome, paraplegia was noted to have a left gluteal decubitus ulcer and was brought into the hospital. During the course of her hospitalization she was debrided in the operating room by  ankle wound. Bone cultures were taken at that time but were negative but clinically she was treated for osteomyelitis because of the probing down to bone and open exposed bone. Home health has been giving her antibioticss which include vancomycin and Zosyn. The patient was a smoker until about 3 weeks ago and used to smoke about 10 cigarettes a day for a long while. 12/13/2014 - details of her operative note from 11/03/2014 were reviewed -- PROCEDURE: 1. Excisional debridement skin, subcutaneous, muscle left ischium 35 cm2 2. Excisional debridement skin, subcutaneous tissue left heel 27 cm2 3. Excisional debridement right ankle skin, subcutaneous, bone 30 cm2 01/24/2015 -- she has some issues with her wheelchair cushion but other than that is doing very well and has received Podus boots for her feet. 02/14/2015 -- she was using her old offloading boots and this seemed to have caused her a new pressure ulcer on the left posterior heel near the superior part just below the Achilles tendon. 03/07/2015 -- she has a new ulceration just to the left of the midline on her sacral region more on the left buttock and this has been there for Dr. Leland [name] and had all the wounds sharply debrided. The debridement was done for the left ischial wound, the left heel wound and the right about a week. 08/22/2015 -- was recently admitted to hospital between May 5 and 08/13/2015, with sepsis and leukocytosis due to a UTI. she was treated for a sepsis complicating Escherichia coli UTI and kidney stones. She also had metabolic and careful up at the secondary to pyelonephritis. He received broad-spectrum antibiotics initially and then received Macrobid as per urology. She was sent home on nitrofurantoin. during her admission she had a CT scan which showed exposed left ischial tuberosity without evidence of osteolysis. 09/12/2015-- the patient is having some issues with her air mattress and would like to get a opinion from  medical modalities. 10/10/2015 -- the issue with her air mattress has not yet been sorted out and the new problem seems to be a lot of odor from the wound VAC. 11/27/2015 -- the patient was admitted to the hospital between July 23 and 10/31/2015. Her problems were sepsis, osteomyelitis of the pelvic bone and acute pyelonephritis. CT of the abdomen and pelvis was consistent with a left-sided pyelonephritis with hydronephrosis and also just showed new sclerosis of the posterior portion of the left anterior pubic ramus suggestive of periosteal reaction consistent with osteomyelitis.  She was treated for the osteomyelitis with infectious disease consult recommending 6 weeks of IV antibiotics including vancomycin and Rocephin and the antibiotics were to go on until 12/10/2015. He was seen by [name] plastic surgery and [name] of infectious disease. She had a suprapubic catheter placed during the admission. CT scan done on 10/28/2015 showed specifically -- New sclerosis of the posterior portion of the left inferior pubic ramus with aggressive periosteal reaction, consistent with osteomyelitis, with adjacent soft tissue gas compatible with previously described decubitus ulcer. 12/12/2015 -- she was recently seen by [name], who noted good improvement and CRP and ESR compared to before and he has stopped her antibiotic as per plans to finish on September 4. The patient was encouraged to continue with wound care and consider hyperbaric oxygen therapy. Today she tells me that she has consented to undergo hyperbaric oxygen therapy and we can start the paperwork. 01/02/2016 -- her PCP had gained about 3 years but she still persists in having problems during hyperbaric oxygen therapy with some discomfort in the ears. 01/09/16; pressure area with underlying osteomyelitis in the left buttock. Wound bed itself has some slight amount of grayish surface slough however I do not think any debridement was  necessary. There is no exposed bone soft tissue appears stable. She is using a wound VAC 01/16/16; back for weekly wound review in conjunction with HBO. She has a deep wound over the left initial tuberosity previously treated with 6 weeks of IV antibiotics for osteomyelitis. Wound bed looks reasonably healthy although the base of this is still precariously close to bone. She has been using a wound VAC. 01/23/2016 -- she has completed her course of antibiotics and this week the only new thing is her right great toe nail was avulsed and she has got an open wound over the nailbed. 01/31/16 she has completed her course of antibiotics. Her right great toenail avulsed last week and she's been using silver alginate for this as well. Still using a wound VAC to the substantial stage IV wound over the left ischial tuberosity 03/05/2016 -- the patient has had a opinion from the plastic surgery group at [facility] and details of this are not available yet but the patient's verbal report has been heard by me. Did not sound like there was any optimistic discussion regarding reconstruction and the net result would be to continue with the wound VAC application. I will await the official reports. Addendum: -- she was seen at [place] surgery service by Dr. Tressa [name]. After a thorough review and from what I understand spending 45 minutes with the patient his assessment has been noted by me in detail and the management options were: 1. Continued pressure offloading and wound care versus operative procedures including wound excision 2. Soft tissue and bone sampling 3. If the wound gets larger wound closure would be done using a variety of plastic surgical techniques including but not limited to skin substitute, possible skin graft, local versus regional flaps, negative pressure dressing application. 4. He discussed with her details of flap surgery and the risks associated 5. He made a  comment that since the patient was operated on by Dr. Leland [name] of [facility] plastic surgery unit in [place] the patient may continue to follow-up there for further evaluation for surgical flap closure in the future. 03/19/2016 -- the patient continues to be rather depressed and frustrated with her lack of rapid progress in healing this wound especially because  she thought after hyperbaric oxygen therapy the wound would heal extremely fast. She now understands that was not the implied benefit on wound care which was the recommendation for hyperbaric oxygen therapy. I have had a lengthy discussion with the patient and her husband regarding her options: 1. Continue with collagen and wound VAC for the primary dressing and offloading and all supportive care. 2. See Dr. Leta Baptist for possible placement of Acell or Integra in the OR. 3. get a second opinion from a wound care center and surrounding regions/counties 05/07/2016 -- Note from Dr. Sherrilyn Rist, who noted that the patient has declined flap surgery. She has discussed application of A cell, and try a few applications to see how the wound progresses. She is also recommended that we could apply products here in the wound center, like Oasis. during her preop workup it was found that her hemoglobin A1c was 11% and she has now been diagnosed as having diabetes mellitus and has been put on appropriate treatment by her PCP 05/28/2016 -- tells me her blood sugars have been doing well and she has an appointment to see her PCP in the next couple of weeks to check her hemoglobin Barajas, Magda Kiel ([id number]) [id number] Page 4 of 18 A1c. Other than that she continues to do well. 06/25/2016 -- have not seen her back for the last month but she says her health has been about the same and she has an appointment to check the A1c next week 09/10/16 ---- was seen by Dr. Sherrilyn Rist -- who applied Acell and saw her  back in follow-up. She has recommended silver alginate to the wound every other day and cover with foam. If no significant drainage could transition to collagen every other day. She recommended discontinuing wound VAC. There were no plans to repeat application of Acell. The patient expressed that her husband could do the wound care as going to the Wound Ctr., would cost several $100 for each visit. 10/21/2016 -- her insurance company is getting her new mattress and she is pleased about that. Other than that she has been doing dressings with PolyMem Silver and has been doing very well 02/18/2017 -- she has gone through several changes of her mattress and has not been pleased with any of them. The ventricles are still working on trying to fit her with the appropriate low air-loss mattress. She has a new wound on the gluteal area which is clearly separated from the original wound. 03/25/17-she is here in follow-up evaluation for her left ischialpressure ulcer. She remains unsatisfied with her pressure mattress. She admits to sitting multiple hours a day, in the bed. We have discussed offloading options. The wound does not appear infected. Nutrition does not appear to be a concern. Will follow-up in 4 weeks, if wound continues to be stalled may consider x-ray to evaluate for refractory osteomyelitis. 04/21/17; this is a patient that I don't know all that well. She has a chronic wound which at one point had underlying osteomyelitis in the left ischial tuberosity. This is a stage IV pressure ulcer. Over the last 3 months she has a stage II wound inferiorly to the original wound. The last time she was here her dressing was changed to silver collagen although the patient's husband who changes the dressing said that the collagen stuck to the wound and remove skin from the superficial area therefore he switched back to PolyMem AG 05/13/17; this is a patient we've been following for a left ischial tuberosity wound  which was stage IV at one point had underlying osteomyelitis. Over the last several months she's had a stage II wound just inferior and medial to the related to the wound. According to her husband he is using Endoform layer with collagen although this is not what I had last time. According to her husband they are using Elgie Congo with collagen although I don't quite know how that started. She was hospitalized from 1/20 through 04/30/16. This was related to a UTI. Her blood cultures were negative, urine culture showed multiple species. She did have a CT scan of the abdomen and pelvis which documented chronic osteomyelitis in the area of the wound inflammatory markers were unremarkable. She has had prior knowledge of osteomyelitis. It looks as though she received IV antibiotics in 2017 and was treated with a course of hyperbaric oxygen. 05/28/17; the wound over the left ischial tuberosity is deeper today and abuts clearly on bone. Nursing intake reported drainage. I therefore culture of the wound. The more superficial area just below this looks about the same. They once again complained that there are mattress cover is not working although apparently [facility] is been noted to see this many times in the report is that the device is functional 06/18/17; the patient had a probing area on the left ischial tuberosity that was draining purulent fluid last time. This also clearly seemed to have open bone. Culture I did showed pansensitive pseudomonas including third generation cephalosporins. I treated this with cefdinir 300 twice a day for 10 days and things seem to have improved. She has a more superficial wound just underneath this area. Amazingly she has a new air mattress through [facility]. I think they gave this to her as a parking give. In any case this now works according to the patient may have something to do with why the areas are looking better. 07/09/17; the patient has a probing  area in the left ischial tuberosity that still has some depth. However this is contracted in terms of the wound orifice although the depth is still roughly the same. There is no undermining. She also has the satellite wound which is more superficial. This appears to have a healthy surface we've been using silver collagen 08/06/17; the patient's wound is over the left ischial tuberosity and a satellite lesion just underneath this. The original wound was actually a deep stage 4 wound. We have made good progress in 2 months and there is no longer exposed bone here. 09/03/17; left ischial tuberosity actually appears to be quite healthy. I think we are making progress. No debridement is required. There is no surrounding erythema 10/01/17 I follow this patient monthly for her left ischial tuberosity wound. There is 2 areas the original area and a satellite area. The satellite area looks a lot better there is no surrounding erythema. Her husband relates that he is having trouble maintaining the dressing. This has to do with the soft tissue around it. He states he puts the collagen in but he cannot make sure that it stays in even with the ABD pads and tape that he is been using 10/29/17; patient arrives with a better looking noon today. Some of the satellite lesions have closed. using Prisma 11/26/17; the patient has a large cone-shaped area with the tip of the Cone deep within her buttock soft tissue. The walls of the Cone are epithelialized however the base is still open. The area at the base of this looks moist we've been using  silver collagen. Will change to silver alginate 12/31/2017; the wound appears to have come in fairly nicely. Using silver alginate. There is no surrounding maceration or infection 01/28/18; there is still an open area here over the left initial tuberosity. Base of this however looks healthy. There is no surrounding infection 02/25/18; the area of its open is over the left ischial  tuberosity. The base of this is where the wound is. This is a large inverted cone-shaped area with the wound at the tip. Dimensions of the wound at the tip are improved. There is a area of denuded skin about halfway towards the tip which her husband thinks may have happened today when he was bathing her. 04/20/17; the area is still open over the left initial tuberosity. 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 This is now completely closed over although it is closed over with a divot and skin over bone at the base of this. Continued aggressive offloading will be necessary. 12/30/2018 READMISSION This is a 71 year old woman with chronic paraplegia. I picked her up for her care from Dr. Con Memos in this clinic after he departed. She had a stage IV pressure wound over the left ischial tuberosity. She was treated twice for her underlying osteomyelitis and this I believe firstly in 2016 and again in 2017. There were some plans at some point for flap closure of this however she was discovered to have uncontrolled diabetes and I do not think this was ever accomplished. She ultimately healed over in this clinic and was discharged in May. She has a large cone-shaped indentation with the tip of this going towards the left ischial tuberosity. It is not an easy area to examine but at that time I thought all of this was epithelialized. Apparently there was a reopening here shortly after she left the clinic last time. She was admitted to hospital at the end of June for Klebsiella bacteremia felt to be secondary to UTI. A CT scan of the pelvis is listed below and there was initially some concern that she had underlying osteomyelitis although I believe she was seen by infectious disease and that was felt to be not the case: I  do not see any new cultures or inflammatory markers IMPRESSION: 1. No CT evidence for acute intra-abdominal or pelvic abnormality. Large volume of stool throughout the colon. 2. Enlarged fatty liver with fat sparing near the gallbladder fossa 3. Cortical scarring right kidney. Bilateral intrarenal stones without hydronephrosis. Thick-walled urinary bladder decompressed by suprapubic catheter 4. Deep left decubitus ulcer with underlying left ischial changes suggesting osteomyelitis. Her husband has been using silver collagen in the wound. She has not been systemically unwell no fever chills eating and drinking well. They rigorously offload this wound only getting up in the wheelchair when she is going to appointments the rest of the time she is in bed. 10/8; wound measures larger and she now has exposed bone. We have been using silver alginate 11/12 still using silver alginate. The patient saw [name] of infectious disease. She was started on Augmentin 500 mg twice daily. She is due to follow-up with [name] I believe next week. Lab work [name] requested showed a sedimentation rate of 28 and CRP of 20 although her CRP 1 year ago was 18.8. Sedimentation rate  1 year ago was 11 basic metabolic panel showed a creatinine of 1.12 Trang, Sherrica M ([id number]) [id number] Page 5 of 18 12/3; the patient followed up with [name] yesterday. She is still on Augmentin twice daily. This was directed by [name]. The patient's inflammatory markers have improved which is gratifying. Her C-reactive protein was repeated yesterday and follow-up booked with infectious disease in January. In addition I have been getting secure text messages I think from palliative care through [facility] [employer]. I think they were hoping to provide services to the patient in her home. They could not get a hold of the primary physician and so they reached out  to me on 2 separate occasions. 12/17; patient last saw [name] on 12/2. She is finishing up with Augmentin. Her C-reactive protein was 20 on 10/21, 10.1 on 11/19 and 17 on 12/2. The wound itself still has depth and undermining. We are using Santyl with the backing wet-to-dry 04/27/2019. The wound is gradually clearing up in terms of the surface although it is not filled in that much. Still abuts right against bone 2/4; patient with a deep pressure ulcer over the left ischial tuberosity. I thought she was going to follow-up with infectious disease to follow her inflammatory markers although the patient states that they stated that they did not need to see her unless we felt it was necessary. I will need to check their notes. In any case we ordered moistened silver collagen back with wet-to-dry to fill in the depth of the wound although apparently [facility] sent silver alginate which they have been using since they were here the last time. Is obviously not what we ordered. 2/25. Not much change in this wound it is over the left ischial tuberosity recurrent wound. We have been using silver collagen with backing wet-to-dry. I think the wound is about the same. There is still some tunneling from about 10-12 o'clock over the ischial tuberosity itself 3/11; pressure ulcer over the left ischial tuberosity. Since she was last here the wound VAC was started and apparently going quite well. We are able to get the home health company that accepts [place] healthcare which is in itself sometimes problematic. There is been improvements in the wound the tunneling seems to be better and is contracted nicely 4/8; 1 month follow-up. Since she was last here we have been using silver collagen under a wound VAC. Some minor contraction I think in wound volume. She is cared for diligently by her husband including pressure relief, incontinence management, nutritional support etc. 6/1; this is almost a 80-month follow-up. She is  been using silver collagen under wound VAC. Circular area over the left ischial tuberosity. She has been using silver collagen under wound VAC 7/8; 1 month follow-up. Silver collagen under the VAC not really a lot of progress. Tissue at the base of the wound which is right against bone and the tissue next that this does not look completely viable. She is not currently on any antibiotics, she had underlying osteomyelitis I need to look this over 8/16; we are using silver collagen under wound VAC to the left ischial tuberosity wound. Comes in today with absolutely no change in surface area or depth. There is no exposed bone. I did look over her infectious disease notes as I said I would do last time. She last saw [name] in December 2020. She completed 6 weeks of Augmentin. This was in response to a bone culture I did  showing methicillin susceptible staph aureus and Enterococcus. She was supposed to come back to see [name] at some point although they say that that appointment was canceled unless I chose to recommend return. I think there was supposed to be follow-up with inflammatory markers but I cannot see that that was ever done. She has not been on antibiotics since 9/21; monthly follow-up. We received a call from home health nurse last evening to report green drainage coming out of the wound. Lab work I ordered last time showed a white count of 5.2 a sedimentation rate of 45 and a C-reactive protein of 25 however neither one of the 2 values are substantially different from her previous values in October 2020 or December 2020. Both are slightly higher but only marginally. Otherwise no new complaints from the patient or her husband 10/19; 1 month follow-up. PCR culture I did last time showed medium quantities of Pseudomonas lower quantities Klebsiella and Enterococcus faecalis group B strep and Peptostreptococcus. I gave her Augmentin for 2 weeks. I am not really sure of my choice of this I  would not cover Pseudomonas. She is still having green drainage. Wound itself looks satisfactory there is not a lot of depth wound bed looks healthy 11/16; patient has completed the antibiotics still using gentamicin and silver alginate on the wound. There is improvement in the surface area 12/21; in general the area on the buttock looks somewhat better. Surface looks healthy although I do not know that there is been much improvement in the wound volume. We have been using silver alginate and Hydrofera Blue. Less drainage. In passing the husband showed me an abrasion injury on the left anterior tibia. Covered in necrotic surface. He has noticed this for about a week and has been putting silver collagen on it. He is completely uncertain about how this happened 1/25; monthly follow-up. The area on the left buttock is about the same. This does not go to bone but a fairly deep wound surface of the wound is of questionable viability. The abrasion injury that they showed me last time apparently was closed out by home health because they thought it was healed but certainly is not although it is just about healed. As a result they haven't been applying anything to this area Finally I did discuss with the patient and her husband the idea of an advanced treatment product to try and get a proper base to this wound I was thinking of Puraply however actually the patient points out that her co-pay for coming to visit [place] i.e. the facility, charge would be unaffordable if they have to, on a weekly basis 2/22; pressure ulcer on the left buttock appears deeper to me and abuts on the ischial tuberosity. I thought initially there was exposed bone but there is a rim of tissue over this area. She also has a superficial over the right anterior mid tibia. Been using silver collagen to these areas without much success. I have looked over the patient's past history with regards to the area on the left ischium. She did have  underlying osteomyelitis here dating back I think to late 2020. She saw [name] she received a 6-week course of oral antibiotics in response to a bone culture that I did. This does not appear to be infected but it certainly has not been improving in terms of granulation. I do not believe she has had any recent imaging studies 3/29; 1 month follow-up. Pressure ulcer on the left buttock which  she has been dealing with with for a number of years. She was treated for underlying osteomyelitis at 1.2 or 3 years ago I think with infectious disease help. She also had I think a flap closure by Dr. Leland [name] and that lasted for about a year and then reopened. I have not been able to get this patient to progress towards healing although truthfully the wound is absolutely no worse. We have been using Hydrofera Blue 4/26; patient presents for 1 week follow-up. She has been using silver collagen to the area every other day. She has home health that comes out once a week to help with dressing changes as well. The patient is interested in trying a skin substitute over this area. She states she is trying to relieve pressure off of it most of the day. 5/24; patient presents for 1 month follow-up. She has been using silver collagen to the area every other day. She has no complaints today. She is interested in the skin substitute. She tries to leave relieve pressure off Her bottom however is not able to most of the day 6/14; using silver collagen to the area over the left ischial tuberosity. Wound does not appear to be doing particularly well. Open to bone 6/28; the patient patient presented last time with a marked deterioration. Depth probing all the way to bone. The bone itself did not look particularly viable. In spite of this the x-ray I did showed longstanding ulcer over the left ischial tuberosity with chronic bone involvement/reaction that was also seen by CT in 2020. Lab work did not show just active  infection with a sed rate of 14 and a C-reactive protein of 7.1. Her comprehensive metabolic panel was normal including an albumin of 4.1 white count was 6 7/19; patient was here 3 weeks ago with a marked deterioration in her wound over the left ischial tuberosity. I ordered a CT scan of the area for 1 reason or another this just did not get done. It is now booked for 8 days from now. She was supposed to come back for bone culture and pathology. That did not happen either. We have been using silver collagen. As usual she is diligently looked after by her husband 8/24; 5-week follow-up. Since the patient was last here the biopsy that I did of her ischial tuberosity came back suggesting osteomyelitis. Culture showed strep. I started her on Augmentin. She was seen by infectious disease Dr. Lucianne Lei dam and the Augmentin was continued and she is still taking it. CT scan did not suggest anything other than chronic osteomyelitis without much change from her previous study. Her C-reactive protein was only 7 and sedimentation rate was 1.1. We are still using silver collagen to the wound. She has home health. Her husband is very diligent in her care for this reason I have never pursued a diverting colostomy. She would not agree to this surgery in any case. Finally we have discussed plastic surgery with him in the past and she is not interested in a myocutaneous flap. 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 Since she was last here she was in the ER last week with abdominal pain. She was found to be impacted however in the course of the review there somebody gave her some IV morphine and apparently she developed hives and blisters. There is still tense blisters on her plantar left fifth and fourth fingers 10/4; the patient has completed her Augmentin and is due to follow-up with Dr. Drucilla Schmidt  in early November. Her wound today measures about the same but looks a little healthier in terms of granulation there is no  exposed bone. I note that she was admitted to hospital for 2 days from 9/21 through 9/23 for delirium. She had received Versed for glaucoma surgery ultimately that was felt to be the etiology. Her blood cultures were negative she had 30,000 colonies of Pseudomonas in her urine. She did receive broad-spectrum antibiotic therapy but ultimately her wound on the buttock was not felt to be the cause. As ANTASIA, HAIDER ([id number]) [id number] Page 6 of 18 mentioned she has completed her antibiotics still using silver collagen 11/1; patient has completed her antibiotics for the underlying osteomyelitis. She apparently follows up with Dr. Drucilla Schmidt next week. The wound does not look too bad perhaps slightly narrower in terms of width but the depth is about the same. We have been using Hydrofera Blue. The patient talks to me about a wound VAC and made it sound as though she was recently on 1 although I do not see this. The other option is an advanced treatment product like Oasis but that means weekly trips into the clinic. She has previously said she does not want an attempt at plastic surgery 11/15; the patient saw Dr. Drucilla Schmidt noted that her follow-up inflammatory markers normalized. She has completed her antibiotics. We are currently using Hydrofera Blue. Wound itself has some surface tissue over bone but certainly not a lot. I have looked back over her history. The patient has had recurrent osteomyelitis in this area as she is received prolonged courses of antibiotics. We did use a wound VAC for a prolonged period of time in 2021. We had some improvement especially in undermining areas but overall not a lot of measurable improvement. Once again I have tried to think about using advanced treatment products in this area something that would require them visiting the clinic very frequently and they did not seem to want to do this. Most recently we ran [place] however she would have a  $338 per application co-pay 32/9; left ischial tuberosity. I do not see much difference in 3 weeks. There is no exposed bone. We are using Hydrofera Blue. Our intake nurse reports some "greenish" drainage 12/21 left ischial tuberosity. Measuring slightly smaller in surface area. The wound still has some tissue over bone i.e. there is no exposed bone. No overt infection. We did a deep tissue culture last time for PCR. The major bacteria was Pseudomonas although there were medium titers of Enterococcus faecalis Klebsiella E. coli and Morganella as well as Peptostreptococcus. Keystone antibiotic include streptomycin and vancomycin they got this last weekend and used it twice 04/17/2021; no change in this wound. It does not have undermining however the surface is of it does not look particularly viable. We have been using topical antibiotic directed at a PCR culture as well as silver alginate. It is clear in talking to the patient and her husband that she does not offload this area adequately including spending all night on this with I think a level 2 surface on her bed. I have told her that this is not adequate to heal a wound like this.. 2/15; Oasis with a $191 co-pay per application. They are using silver alginate to the wound offloading as best they can according to her husband 06/19/2021: At her last visit, Dr. Dellia Nims changed her to silver collagen, but apparently they did not receive this until just last week. They have continued using silver  alginate in the wound. Today, the wound is quite malodorous with necrotic slough. They ran out of [place] topical about 2 months ago. No new cultures have been taken.. 07/17/2021: At the last visit, the wound was in pretty poor condition with a lot of necrotic slough and substantial malodorous drainage. I did take a new culture for PCR, but for some reason this was never resulted. Nonetheless, they did receive a renewal of their [employer] apparently it was  communicated to them that they should use silver alginate rather than the silver collagen. Regardless, the wound is much cleaner at this visit and there is no odor. 07/26/2021: The sacral/left ischial ulcer is fairly clean, but there was some greenish drainage appreciated on the dressing. They have been using topical Keystone with silver alginate. She has unfortunately developed a new ulcer on her right ischium. It is unstageable with a thick layer of eschar. It is malodorous. 08/14/2021: I took a new PCR culture at her last visit from the new wound that had opened on her right ischial tuberosity. This returned with a polymicrobial population. A new topical Keystone compound was formulated. They have been using this on her wounds along with silver alginate. I also prescribed a course of oral antibiotic, Augmentin and Levaquin to try and address the multiple species with various resistance these that grew out. She was able to take this for about 10 days, but it has started to make her nauseated and she is actually thrown up on occasion. Since she takes them at the same time, she is not sure which one is causing her issue. She has also been having a lot more drainage from her left ischial wound. Her husband says that he has not been using the zinc oxide as much because it is difficult to wash off when he bathes her. We have been using Santyl to the new wound under silver alginate and silver alginate to the left ischial wound. 09/11/2021: Unfortunately, there has been fairly substantial breakdown of both of her wounds. It sounds like the gel compound that the [employer] was being mixed in just leaked out everywhere and left the patient wet and macerated. There is worsening necrotic tissue and increased depth on the right ischial wound and the left one has extended further. They have not been using the zinc oxide as recommended. They contacted [facility] and were advised to simply apply the powder directly to the  wounds. 10/09/2021: The right ischial wound has some necrotic tissue on the lateral aspect and into the base, but no purulent drainage and no surrounding erythema or induration. On the left ischial wound, there is leathery nonviable skin extending laterally from the main wound. No significant odor or drainage from this site, either. 11/05/2021: The right ischial wound has more necrotic fibrinous tissue at the base. The leathery eschar on the left ischial wound is beginning to lift at one of the edges and may be amenable to debridement today. We have been using topical powdered mupirocin to her wounds with silver alginate. 12/03/2021: Significant improvement in both of the wounds. There is still some fibrinous slough on the right ischial wound. There is also a heavy layer of slough and fibrinous exudate on the left ischial wound as well as some soft light slough on the remainder of the wound surface. We have been using Santyl for enzymatic debridement between clinic visits. 12/31/2021: The right ischial wound is cleaner today with very minimal slough. The left ischial wound has an area of devitalized  subcutaneous tissue and the most lateral extension of the wound. There is slough and biofilm on the surface, but overall the wounds are looking significantly better. We have been unable to obtain Santyl and they have just been doing wet-to-dry dressing changes with good effect. Electronic Signature(s) Signed: 12/31/2021 11:22:56 AM By: Fredirick Maudlin MD FACS Entered By: Fredirick Maudlin on 12/31/2021 11:22:56 -------------------------------------------------------------------------------- Physical Exam Details Patient Name: Date of Service: [name], Laurren M. 12/31/2021 10:30 A Darreld Mclean, [name] ([id number]) [id number] Page 7 of 18 Medical Record Number: [id number] Patient Account Number: [id number] Date of Birth/Sex: Treating RN: Sep 16, 1950 (71 y.o. F) Primary Care  Provider: [name] Other Clinician: Referring Provider: Treating Provider/Extender: Burgess Amor Weeks in Treatment: 156 Constitutional Slightly hypertensive. Slightly tachycardic, asymptomatic. . . No acute distress.Marland Kitchen Respiratory Normal work of breathing on room air.. Notes 12/31/2021: The right ischial wound is cleaner today with very minimal slough. The left ischial wound has an area of devitalized subcutaneous tissue at the most lateral extension of the wound. There is slough and biofilm on the surface, but overall the wounds are looking significantly better. Electronic Signature(s) Signed: 12/31/2021 11:25:21 AM By: Fredirick Maudlin MD FACS Entered By: Fredirick Maudlin on 12/31/2021 11:25:21 -------------------------------------------------------------------------------- Physician Orders Details Patient Name: Date of Service: [name]. 12/31/2021 10:30 A M Medical Record Number: [id number] Patient Account Number: [id number] Date of Birth/Sex: Treating RN: September 05, 1950 (71 y.o. 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 - Dr. Celine Ahr - Room 1 ****HOYER**** Anesthetic Wound #12 Left Ischial Tuberosity (In clinic) Topical Lidocaine 4% applied to wound bed Wound #15 Right Ischium (In clinic) Topical Lidocaine 4% applied to wound bed Bathing/ Shower/ Hygiene May shower and wash wound with soap and water. - with dressing changes Edema Control - Lymphedema / SCD / Other Elevate legs to the level of the heart or above for 30 minutes daily and/or when sitting, a  frequency of: - throughout the day Off-Loading A fluidized (Group 3) mattress ir Turn and reposition every 2 hours - *** Try to shift from side to side and shift to belly (if tolerated),****avoid sitting up in bed except for meals Additional Orders / Instructions Follow Nutritious Diet - protein shakes 2-3 times per day, Juven 2 times per day [place] wound care orders this week; continue Home Health for wound care. May utilize formulary equivalent dressing for wound treatment orders unless otherwise specified. Dressing changes to be completed by [facility] on Monday / Wednesday / Friday except when patient has scheduled visit at [facility]. - increase visits to 3 times per week (spouse having difficulty with dressing changes) Wauters, [name] ([id number]) [id number] Page 8 of 18 Other Home Health Orders/Instructions: - [facility] Wound Treatment Wound #12 - Ischial Tuberosity Wound Laterality: Left Cleanser: Soap and Water 1 x Per Day/30 Days Discharge Instructions: May shower and wash wound with dial antibacterial soap and water prior to dressing change. Cleanser: Wound Cleanser (Generic) 1 x Per Day/30 Days Discharge Instructions: Cleanse the wound with wound cleanser prior to applying a clean dressing using gauze sponges, not tissue or cotton balls. Peri-Wound Care: Skin Prep (  Generic) 1 x Per Day/30 Days Discharge Instructions: Use skin prep as directed Prim Dressing: Medline Woven Gauze Sponges 4x4 (in/in) 1 x Per Day/30 Days ary Discharge Instructions: moisten with saline and pack lightly into wound Secondary Dressing: Zetuvit Plus Silicone Border Dressing 5x5 (in/in) (Generic) 1 x Per Day/30 Days Discharge Instructions: Apply silicone border or ABD padover primary dressing as directed. Wound #15 - Ischium Wound Laterality: Right Cleanser: Soap and Water 1 x Per Day/30 Days Discharge Instructions: May shower and wash wound with dial  antibacterial soap and water prior to dressing change. Cleanser: Wound Cleanser (Generic) 1 x Per Day/30 Days Discharge Instructions: Cleanse the wound with wound cleanser prior to applying a clean dressing using gauze sponges, not tissue or cotton balls. Peri-Wound Care: Skin Prep (Generic) 1 x Per Day/30 Days Discharge Instructions: Use skin prep as directed Prim Dressing: Medline Woven Gauze Sponges 4x4 (in/in) 1 x Per Day/30 Days ary Discharge Instructions: moisten with saline and pack lightly into wound Secondary Dressing: Zetuvit Plus Silicone Border Dressing 5x5 (in/in) (Generic) 1 x Per Day/30 Days Discharge Instructions: Apply silicone border or ABD padover primary dressing as directed. Patient Medications llergies: Iodinated Contrast Media - IV Dye, aspirin, Sulfa (Sulfonamide Antibiotics) A Notifications Medication Indication Start End prior to debridement 12/31/2021 lidocaine DOSE topical 4 % cream - cream topical Electronic Signature(s) Signed: 12/31/2021 12:12:30 PM By: Fredirick Maudlin MD FACS Entered By: Fredirick Maudlin on 12/31/2021 11:25:53 -------------------------------------------------------------------------------- Problem List Details Patient Name: Date of Service: [name]. 12/31/2021 10:30 A M Medical Record Number: [id number] Patient Account Number: [id number] Date of Birth/Sex: Treating RN: 1950/10/15 (71 y.o. 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, stage 4 12/30/2018 No Yes Balgobin, [name] ([id number]) [id number] Page 9 of 18 E11.622 Type 2 diabetes mellitus with other skin ulcer 12/30/2018 No Yes L89.313 Pressure ulcer of right buttock, stage 3 07/26/2021 No Yes Inactive  Problems ICD-10 Code Description Active Date Inactive Date G82.20 Paraplegia, unspecified 12/30/2018 12/30/2018 M86.68 Other chronic osteomyelitis, other site 02/17/2019 02/17/2019 S80.811D Abrasion, right lower leg, subsequent encounter 03/27/2020 03/27/2020 M86.68 Other chronic osteomyelitis, other site 11/28/2020 11/28/2020 L97.811 Non-pressure chronic ulcer of other part of right lower leg limited to breakdown of skin 03/27/2020 03/27/2020 Resolved Problems Electronic Signature(s) Signed: 12/31/2021 11:21:06 AM By: Fredirick Maudlin MD FACS Entered By: Fredirick Maudlin on 12/31/2021 11:21:06 -------------------------------------------------------------------------------- Progress Note Details Patient Name: Date of Service: [name]. 12/31/2021 10:30 A M Medical Record Number: [id number] Patient Account Number: [id number] Date of Birth/Sex: Treating RN: [birth year] (71 y.o. F) Primary Care Provider: [name] Other Clinician: Referring Provider: Treating Provider/Extender: Nance Pew in Treatment: 156 Subjective Chief Complaint Information obtained from Patient She is here for follow up evaluation of left ischial pressure ulcer 12/30/2018; patient comes back for review of wounds in the same general area over the previously healed left ischial pressure ulcer History of Present Illness (HPI) The following HPI elements were documented for the patient's wound: Location: open ulceration of the left gluteal area, left heel and right ankle for about 5 months. Quality: Patient reports No Pain. Severity: Patient states wound(s) are getting worse. Duration: Patient has had the wound for > 5 months prior to seeking treatment at the wound center Context: The wound occurred when the patient has been paraplegic  for about 3 years. Modifying Factors: Wound improving due to current treatment. Associated Signs and Symptoms: Patient reports having foul odor. this  71 year old patient who is known to have hypertension, hypothyroidism, breast cancer, chronic pain syndrome, paraplegia was noted to have a left gluteal decubitus ulcer and was brought into the hospital. During the course of her hospitalization she was debrided in the operating room by ankle wound. Bone cultures were taken at that time but were negative but clinically she was treated for osteomyelitis because of the probing down to bone and open exposed bone. Home health has been giving her antibioticss which include vancomycin and Zosyn. The patient was a smoker until about 3 weeks ago and used to smoke about 10 cigarettes a day for a long while. 12/13/2014 - details of her operative note from 11/03/2014 were reviewed -- PROCEDURE: 1. Excisional debridement skin, subcutaneous, muscle left ischium Wessell, [name] ([id number]) [id number] Page 10 of 18 35 cm2 2. Excisional debridement skin, subcutaneous tissue left heel 27 cm2 3. Excisional debridement right ankle skin, subcutaneous, bone 30 cm2 01/24/2015 -- she has some issues with her wheelchair cushion but other than that is doing very well and has received Podus boots for her feet. 02/14/2015 -- she was using her old offloading boots and this seemed to have caused her a new pressure ulcer on the left posterior heel near the superior part just below the Achilles tendon. 03/07/2015 -- she has a new ulceration just to the left of the midline on her sacral region more on the left buttock and this has been there for Dr. Leland [name] and had all the wounds sharply debrided. The debridement was done for the left ischial wound, the left heel wound and the right about a week. 08/22/2015 -- was recently admitted to hospital between May 5 and 08/13/2015, with sepsis and leukocytosis due to a UTI. she was treated for a sepsis complicating Escherichia coli UTI and kidney stones. She also had metabolic and careful up at the secondary to  pyelonephritis. He received broad-spectrum antibiotics initially and then received Macrobid as per urology. She was sent home on nitrofurantoin. during her admission she had a CT scan which showed exposed left ischial tuberosity without evidence of osteolysis. 09/12/2015-- the patient is having some issues with her air mattress and would like to get a opinion from medical modalities. 10/10/2015 -- the issue with her air mattress has not yet been sorted out and the new problem seems to be a lot of odor from the wound VAC. 11/27/2015 -- the patient was admitted to the hospital between July 23 and 10/31/2015. Her problems were sepsis, osteomyelitis of the pelvic bone and acute pyelonephritis. CT of the abdomen and pelvis was consistent with a left-sided pyelonephritis with hydronephrosis and also just showed new sclerosis of the posterior portion of the left anterior pubic ramus suggestive of periosteal reaction consistent with osteomyelitis. She was treated for the osteomyelitis with infectious disease consult recommending 6 weeks of IV antibiotics including vancomycin and Rocephin and the antibiotics were to go on until 12/10/2015. He was seen by [name] plastic surgery and [name] of infectious disease. She had a suprapubic catheter placed during the admission. CT scan done on 10/28/2015 showed specifically -- New sclerosis of the posterior portion of the left inferior pubic ramus with aggressive periosteal reaction, consistent with osteomyelitis, with adjacent soft tissue gas compatible with previously described decubitus ulcer. 12/12/2015 -- she was recently seen by [name], who noted good improvement and  CRP and ESR compared to before and he has stopped her antibiotic as per plans to finish on September 4. The patient was encouraged to continue with wound care and consider hyperbaric oxygen therapy. Today she tells me that she has consented to undergo hyperbaric oxygen therapy and we can  start the paperwork. 01/02/2016 -- her PCP had gained about 3 years but she still persists in having problems during hyperbaric oxygen therapy with some discomfort in the ears. 01/09/16; pressure area with underlying osteomyelitis in the left buttock. Wound bed itself has some slight amount of grayish surface slough however I do not think any debridement was necessary. There is no exposed bone soft tissue appears stable. She is using a wound VAC 01/16/16; back for weekly wound review in conjunction with HBO. She has a deep wound over the left initial tuberosity previously treated with 6 weeks of IV antibiotics for osteomyelitis. Wound bed looks reasonably healthy although the base of this is still precariously close to bone. She has been using a wound VAC. 01/23/2016 -- she has completed her course of antibiotics and this week the only new thing is her right great toe nail was avulsed and she has got an open wound over the nailbed. 01/31/16 she has completed her course of antibiotics. Her right great toenail avulsed last week and she's been using silver alginate for this as well. 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 Did not sound like there was any optimistic discussion regarding reconstruction and the net result would be to continue with the wound VAC application. I will await the official reports. Addendum: -- she was seen at [facility] by Dr. Tressa [name]. After a thorough review and from what I understand spending 45 minutes with the patient his assessment has been noted by me in detail and the management options were: 1. Continued pressure offloading and wound care versus operative procedures including wound excision 2.  Soft tissue and bone sampling 3. If the wound gets larger wound closure would be done using a variety of plastic surgical techniques including but not limited to skin substitute, possible skin graft, local versus regional flaps, negative pressure dressing application. 4. He discussed with her details of flap surgery and the risks associated 5. He made a comment that since the patient was operated on by Dr. Leland [name] of [facility] plastic surgery unit in [place] [name] the patient may continue to follow-up there for further evaluation for surgical flap closure in the future. 03/19/2016 -- the patient continues to be rather depressed and frustrated with her lack of rapid progress in healing this wound especially because she thought after hyperbaric oxygen therapy the wound would heal extremely fast. She now understands that was not the implied benefit on wound care which was the recommendation for hyperbaric oxygen therapy. I have had a lengthy discussion with the patient and her husband regarding her options: 1. Continue with collagen and wound VAC for the primary dressing and offloading and all supportive care. 2. See [name] for possible placement of Acell or Integra in the OR. 3. get a second opinion from a wound care center and surrounding regions/counties 05/07/2016 -- Note from [name] Miyamoto, who noted that the patient has  declined flap surgery. She has discussed application of A cell, and try a few applications to see how the wound progresses. She is also recommended that we could apply products here in the wound center, like Oasis. during her preop workup it was found that her hemoglobin A1c was 11% and she has now been diagnosed as having diabetes mellitus and has been put on appropriate treatment by her PCP 05/28/2016 -- tells me her blood sugars have been doing well and she has an appointment to see her PCP in the next couple of weeks to check her hemoglobin A1c. Other  than that she continues to do well. 06/25/2016 -- have not seen her back for the last month but she says her health has been about the same and she has an appointment to check the A1c next week 09/10/16 ---- was seen by [name] Miyamoto -- who applied Acell and saw her back in follow-up. She has recommended silver alginate to the wound every other day and cover with foam. If no significant drainage could transition to collagen every other day. She recommended discontinuing wound VAC. There were no plans to repeat application of Acell. The patient expressed that her husband could do the wound care as going to the Wound Ctr[facility] would cost several $100 for each visit. 10/21/2016 -- her insurance company is getting her new mattress and she is pleased about that. Other than that she has been doing dressings with PolyMem Silver and has been doing very well 02/18/2017 -- she has gone through several changes of her mattress and has not been pleased with any of them. The ventricles are still working on trying to fit her with the appropriate low air-loss mattress. She has a new wound on the gluteal area which is clearly separated from the original wound. 03/25/17-she is here in follow-up evaluation for her left ischialpressure ulcer. She remains unsatisfied with her pressure mattress. She admits to sitting multiple hours a day, in the bed. We have discussed offloading options. The wound does not appear infected. Nutrition does not appear to be a concern. Will follow-up in 4 weeks, if wound continues to be stalled may consider x-ray to evaluate for refractory osteomyelitis. 04/21/17; this is a patient that I don't know all that well. She has a chronic wound which at one point had underlying osteomyelitis in the left ischial tuberosity. This is a stage IV pressure ulcer. Over the last 3 months she has a stage II wound inferiorly to the original wound. The last time she was here her dressing was changed to silver  collagen although the patient's husband who changes the dressing said that the collagen stuck to the wound and remove skin from the superficial area therefore he switched back to [place] 05/13/17; this is a patient we've been following for a left ischial tuberosity wound which was stage IV at one point had underlying osteomyelitis. Over the last several months she's had a stage II wound just inferior and medial to the related to the wound. According to her husband he is using Endoform layer with collagen although this is not what I had last time. According to her husband they are using Elgie [place] with collagen although I don't quite know how that started. She was hospitalized from 1/20 through 04/30/16. This was related to a UTI. Her blood cultures were negative, urine culture showed multiple species. She did ULYSSA, WALTHOUR ([id number]) [id number] Page 11 of 18 have a CT scan of the abdomen and pelvis  which documented chronic osteomyelitis in the area of the wound inflammatory markers were unremarkable. She has had prior knowledge of osteomyelitis. It looks as though she received IV antibiotics in 2017 and was treated with a course of hyperbaric oxygen. 05/28/17; the wound over the left ischial tuberosity is deeper today and abuts clearly on bone. Nursing intake reported drainage. I therefore culture of the wound. The more superficial area just below this looks about the same. They once again complained that there are mattress cover is not working although apparently [facility] is been noted to see this many times in the report is that the device is functional 06/18/17; the patient had a probing area on the left ischial tuberosity that was draining purulent fluid last time. This also clearly seemed to have open bone. Culture I did showed pansensitive pseudomonas including third generation cephalosporins. I treated this with cefdinir 300 twice a day for 10 days  and things seem to have improved. She has a more superficial wound just underneath this area. Amazingly she has a new air mattress through [facility]. I think they gave this to her as a parking give. In any case this now works according to the patient may have something to do with why the areas are looking better. 07/09/17; the patient has a probing area in the left ischial tuberosity that still has some depth. However this is contracted in terms of the wound orifice although the depth is still roughly the same. There is no undermining. ooShe also has the satellite wound which is more superficial. This appears to have a healthy surface we've been using silver collagen 08/06/17; the patient's wound is over the left ischial tuberosity and a satellite lesion just underneath this. The original wound was actually a deep stage 4 wound. We have made good progress in 2 months and there is no longer exposed bone here. 09/03/17; left ischial tuberosity actually appears to be quite healthy. I think we are making progress. No debridement is required. There is no surrounding erythema 10/01/17 I follow this patient monthly for her left ischial tuberosity wound. There is 2 areas the original area and a satellite area. The satellite area looks a lot better there is no surrounding erythema. Her husband relates that he is having trouble maintaining the dressing. This has to do with the soft tissue around it. He states he puts the collagen in but he cannot make sure that it stays in even with the ABD pads and tape that he is been using 10/29/17; patient arrives with a better looking noon today. Some of the satellite lesions have closed. using Prisma 11/26/17; the patient has a large cone-shaped area with the tip of the Cone deep within her buttock soft tissue. The walls of the Cone are epithelialized however the base is still open. The area at the base of this looks moist we've been using silver collagen. Will change  to silver alginate 12/31/2017; the wound appears to have come in fairly nicely. Using silver alginate. There is no surrounding maceration or infection 01/28/18; there is still an open area here over the left initial tuberosity. Base of this however looks healthy. There is no surrounding infection 02/25/18; the area of its open is over the left ischial tuberosity. The base of this is where the wound is. This is a large inverted cone-shaped area with the wound at the tip. Dimensions of the wound at the tip are improved. There is a area of denuded skin about halfway  towards the tip which her husband thinks may have happened today when he was bathing her. 04/20/17; the area is still open over the left initial tuberosity. 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 This is now completely closed over although it is closed over with a divot and skin over bone at the base of this. Continued aggressive offloading will be necessary. 12/30/2018 READMISSION This is a 71 year old woman with chronic paraplegia. I picked her up for her care from Dr. Con Memos in this clinic after he departed. She had a stage IV pressure wound over the left ischial tuberosity. She was treated twice for her underlying osteomyelitis and this I believe firstly in 2016 and again in 2017. There were some plans at some point for flap closure of this however she was discovered to have uncontrolled diabetes and I do not think this was ever accomplished. She ultimately healed over in this clinic and was discharged in May. She has a large cone-shaped indentation with the tip of this going towards the left ischial tuberosity. It is not an easy area to examine but at that time I thought all of this was epithelialized. Apparently there was a reopening here  shortly after she left the clinic last time. She was admitted to hospital at the end of June for Klebsiella bacteremia felt to be secondary to UTI. A CT scan of the pelvis is listed below and there was initially some concern that she had underlying osteomyelitis although I believe she was seen by infectious disease and that was felt to be not the case: I do not see any new cultures or inflammatory markers IMPRESSION: 1. No CT evidence for acute intra-abdominal or pelvic abnormality. Large volume of stool throughout the colon. 2. Enlarged fatty liver with fat sparing near the gallbladder fossa 3. Cortical scarring right kidney. Bilateral intrarenal stones without hydronephrosis. Thick-walled urinary bladder decompressed by suprapubic catheter 4. Deep left decubitus ulcer with underlying left ischial changes suggesting osteomyelitis. Her husband has been using silver collagen in the wound. She has not been systemically unwell no fever chills eating and drinking well. They rigorously offload this wound only getting up in the wheelchair when she is going to appointments the rest of the time she is in bed. 10/8; wound measures larger and she now has exposed bone. We have been using silver alginate 11/12 still using silver alginate. The patient saw [name] of infectious disease. She was started on Augmentin 500 mg twice daily. She is due to follow-up with [name] I believe next week. Lab work [name] requested showed a sedimentation rate of 28 and CRP of 20 although her CRP 1 year ago was 18.8. Sedimentation rate 1 year ago was 11 basic metabolic panel showed a creatinine of 1.12 12/3; the patient followed up with [name] yesterday. She is still on Augmentin twice daily. This was directed by [name]. The patient's inflammatory markers have improved which is gratifying. Her C-reactive protein was repeated yesterday and follow-up booked with infectious disease in January. In  addition I have been getting secure text messages I think from palliative care through [facility] [employer]. I think they were hoping to provide services to the patient in her home. They could not get a hold of the primary physician  and so they reached out to me on 2 separate occasions. 12/17; patient last saw [name] on 12/2. She is finishing up with Augmentin. Her C-reactive protein was 20 on 10/21, 10.1 on 11/19 and 17 on 12/2. The wound itself still has depth and undermining. We are using Santyl with the backing wet-to-dry 04/27/2019. The wound is gradually clearing up in terms of the surface although it is not filled in that much. Still abuts right against bone 2/4; patient with a deep pressure ulcer over the left ischial tuberosity. I thought she was going to follow-up with infectious disease to follow her inflammatory markers although the patient states that they stated that they did not need to see her unless we felt it was necessary. I will need to check their notes. In any case we ordered moistened silver collagen back with wet-to-dry to fill in the depth of the wound although apparently [facility] sent silver alginate which they have been using since they were here the last time. Is obviously not what we ordered. 2/25. Not much change in this wound it is over the left ischial tuberosity recurrent wound. We have been using silver collagen with backing wet-to-dry. I think the wound is about the same. There is still some tunneling from about 10-12 o'clock over the ischial tuberosity itself 3/11; pressure ulcer over the left ischial tuberosity. Since she was last here the wound VAC was started and apparently going quite well. We are able to get the home health company that accepts [place] healthcare which is in itself sometimes problematic. There is been improvements in the wound the tunneling seems to be better and is contracted nicely 4/8; 1 month follow-up. Since she was last  here we have been using silver collagen under a wound VAC. Some minor contraction I think in wound volume. She is cared for diligently by her husband including pressure relief, incontinence management, nutritional support etc. 6/1; this is almost a 29-month follow-up. She is been using silver collagen under wound VAC. Circular area over the left ischial tuberosity. She has been using silver collagen under wound VAC 7/8; 1 month follow-up. Silver collagen under the VAC not really a lot of progress. Tissue at the base of the wound which is right against bone and the tissue next that this does not look completely viable. She is not currently on any antibiotics, she had underlying osteomyelitis I need to look this over 8/16; we are using silver collagen under wound VAC to the left ischial tuberosity wound. Comes in today with absolutely no change in surface area or depth. There is no exposed bone. I did look over her infectious disease notes as I said I would do last time. She last saw [name] in December 2020. She completed 6 weeks of Augmentin. This was in response to a bone culture I did showing methicillin susceptible staph aureus and Enterococcus. She was supposed to come back to see [name] at some point although they say that that appointment was canceled unless I chose to recommend return. I think [name], [name] ([id number]) [id number] Page 12 of 18 there was supposed to be follow-up with inflammatory markers but I cannot see that that was ever done. She has not been on antibiotics since 9/21; monthly follow-up. We received a call from home health nurse last evening to report green drainage coming out of the wound. Lab work I ordered last time showed a white count of 5.2 a sedimentation rate of 45 and a C-reactive protein  of 25 however neither one of the 2 values are substantially different from her previous values in October 2020 or December 2020. Both are  slightly higher but only marginally. Otherwise no new complaints from the patient or her husband 10/19; 1 month follow-up. PCR culture I did last time showed medium quantities of Pseudomonas lower quantities Klebsiella and Enterococcus faecalis group B strep and Peptostreptococcus. I gave her Augmentin for 2 weeks. I am not really sure of my choice of this I would not cover Pseudomonas. She is still having green drainage. Wound itself looks satisfactory there is not a lot of depth wound bed looks healthy 11/16; patient has completed the antibiotics still using gentamicin and silver alginate on the wound. There is improvement in the surface area 12/21; in general the area on the buttock looks somewhat better. Surface looks healthy although I do not know that there is been much improvement in the wound volume. We have been using silver alginate and Hydrofera Blue. Less drainage. In passing the husband showed me an abrasion injury on the left anterior tibia. Covered in necrotic surface. He has noticed this for about a week and has been putting silver collagen on it. He is completely uncertain about how this happened 1/25; monthly follow-up. The area on the left buttock is about the same. This does not go to bone but a fairly deep wound surface of the wound is of questionable viability. The abrasion injury that they showed me last time apparently was closed out by home health because they thought it was healed but certainly is not although it is just about healed. As a result they haven't been applying anything to this area Finally I did discuss with the patient and her husband the idea of an advanced treatment product to try and get a proper base to this wound I was thinking of Puraply however actually the patient points out that her co-pay for coming to visit [place] i.e. the facility, charge would be unaffordable if they have to, on a weekly basis 2/22; pressure ulcer on the left buttock appears deeper to  me and abuts on the ischial tuberosity. I thought initially there was exposed bone but there is a rim of tissue over this area. She also has a superficial over the right anterior mid tibia. Been using silver collagen to these areas without much success. I have looked over the patient's past history with regards to the area on the left ischium. She did have underlying osteomyelitis here dating back I think to late 2020. She saw [name] she received a 6-week course of oral antibiotics in response to a bone culture that I did. This does not appear to be infected but it certainly has not been improving in terms of granulation. I do not believe she has had any recent imaging studies 3/29; 1 month follow-up. Pressure ulcer on the left buttock which she has been dealing with with for a number of years. She was treated for underlying osteomyelitis at 1.2 or 3 years ago I think with infectious disease help. She also had I think a flap closure by Dr. Leland [name] and that lasted for about a year and then reopened. I have not been able to get this patient to progress towards healing although truthfully the wound is absolutely no worse. We have been using Hydrofera Blue 4/26; patient presents for 1 week follow-up. She has been using silver collagen to the area every other day. She has home health that comes out once  a week to help with dressing changes as well. The patient is interested in trying a skin substitute over this area. She states she is trying to relieve pressure off of it most of the day. 5/24; patient presents for 1 month follow-up. She has been using silver collagen to the area every other day. She has no complaints today. She is interested in the skin substitute. She tries to leave relieve pressure off Her bottom however is not able to most of the day 6/14; using silver collagen to the area over the left ischial tuberosity. Wound does not appear to be doing particularly well. Open to bone 6/28;  the patient patient presented last time with a marked deterioration. Depth probing all the way to bone. The bone itself did not look particularly viable. In spite of this the x-ray I did showed longstanding ulcer over the left ischial tuberosity with chronic bone involvement/reaction that was also seen by CT in 2020. Lab work did not show just active infection with a sed rate of 14 and a C-reactive protein of 7.1. Her comprehensive metabolic panel was normal including an albumin of 4.1 white count was 6 7/19; patient was here 3 weeks ago with a marked deterioration in her wound over the left ischial tuberosity. I ordered a CT scan of the area for 1 reason or another this just did not get done. It is now booked for 8 days from now. She was supposed to come back for bone culture and pathology. That did not happen either. We have been using silver collagen. As usual she is diligently looked after by her husband 8/24; 5-week follow-up. Since the patient was last here the biopsy that I did of her ischial tuberosity came back suggesting osteomyelitis. Culture showed strep. I started her on Augmentin. She was seen by infectious disease Dr. Lucianne Lei dam and the Augmentin was continued and she is still taking it. CT scan did not suggest anything other than chronic osteomyelitis without much change from her previous study. Her C-reactive protein was only 7 and sedimentation rate was 1.1. We are still using silver collagen to the wound. She has home health. Her husband is very diligent in her care for this reason I have never pursued a diverting colostomy. She would not agree to this surgery in any case. Finally we have discussed plastic surgery with him in the past and she is not interested in a myocutaneous flap. 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 Since she was last here she was in the ER last week with abdominal pain. She was found to be impacted however in the course of the review there somebody gave her  some IV morphine and apparently she developed hives and blisters. There is still tense blisters on her plantar left fifth and fourth fingers 10/4; the patient has completed her Augmentin and is due to follow-up with Dr. Drucilla Schmidt in early November. Her wound today measures about the same but looks a little healthier in terms of granulation there is no exposed bone. I note that she was admitted to hospital for 2 days from 9/21 through 9/23 for delirium. She had received Versed for glaucoma surgery ultimately that was felt to be the etiology. Her blood cultures were negative she had 30,000 colonies of Pseudomonas in her urine. She did receive broad-spectrum antibiotic therapy but ultimately her wound on the buttock was not felt to be the cause. As mentioned she has completed her antibiotics still using silver collagen 11/1; patient  has completed her antibiotics for the underlying osteomyelitis. She apparently follows up with Dr. Drucilla Schmidt next week. The wound does not look too bad perhaps slightly narrower in terms of width but the depth is about the same. We have been using Hydrofera Blue. The patient talks to me about a wound VAC and made it sound as though she was recently on 1 although I do not see this. The other option is an advanced treatment product like Oasis but that means weekly trips into the clinic. She has previously said she does not want an attempt at plastic surgery 11/15; the patient saw Dr. Drucilla Schmidt noted that her follow-up inflammatory markers normalized. She has completed her antibiotics. We are currently using Hydrofera Blue. Wound itself has some surface tissue over bone but certainly not a lot. I have looked back over her history. The patient has had recurrent osteomyelitis in this area as she is received prolonged courses of antibiotics. We did use a wound VAC for a prolonged period of time in 2021. We had some improvement especially in undermining areas but overall not a lot of  measurable improvement. Once again I have tried to think about using advanced treatment products in this area something that would require them visiting the clinic very frequently and they did not seem to want to do this. Most recently we ran Kermit however she would have a $269 per application co-pay 48/5; left ischial tuberosity. I do not see much difference in 3 weeks. There is no exposed bone. We are using Hydrofera Blue. Our intake nurse reports some "greenish" drainage 12/21 left ischial tuberosity. Measuring slightly smaller in surface area. The wound still has some tissue over bone i.e. there is no exposed bone. No overt infection. We did a deep tissue culture last time for PCR. The major bacteria was Pseudomonas although there were medium titers of Enterococcus faecalis Klebsiella E. coli and Morganella as well as Peptostreptococcus. Keystone antibiotic include streptomycin and vancomycin they got this last weekend and used it twice RODNISHA, [name] ([id number]) [id number] Page 13 of 18 04/17/2021; no change in this wound. It does not have undermining however the surface is of it does not look particularly viable. We have been using topical antibiotic directed at a PCR culture as well as silver alginate. It is clear in talking to the patient and her husband that she does not offload this area adequately including spending all night on this with I think a level 2 surface on her bed. I have told her that this is not adequate to heal a wound like this.. 2/15; Oasis with a $938 co-pay per application. They are using silver alginate to the wound offloading as best they can according to her husband 06/19/2021: At her last visit, Dr. Dellia Nims changed her to silver collagen, but apparently they did not receive this until just last week. They have continued using silver alginate in the wound. Today, the wound is quite malodorous with necrotic slough. They ran out of [place]  topical about 2 months ago. No new cultures have been taken.. 07/17/2021: At the last visit, the wound was in pretty poor condition with a lot of necrotic slough and substantial malodorous drainage. I did take a new culture for PCR, but for some reason this was never resulted. Nonetheless, they did receive a renewal of their [employer] apparently it was communicated to them that they should use silver alginate rather than the silver collagen. Regardless, the wound is much cleaner at this visit and  there is no odor. 07/26/2021: The sacral/left ischial ulcer is fairly clean, but there was some greenish drainage appreciated on the dressing. They have been using topical Keystone with silver alginate. She has unfortunately developed a new ulcer on her right ischium. It is unstageable with a thick layer of eschar. It is malodorous. 08/14/2021: I took a new PCR culture at her last visit from the new wound that had opened on her right ischial tuberosity. This returned with a polymicrobial population. A new topical Keystone compound was formulated. They have been using this on her wounds along with silver alginate. I also prescribed a course of oral antibiotic, Augmentin and Levaquin to try and address the multiple species with various resistance these that grew out. She was able to take this for about 10 days, but it has started to make her nauseated and she is actually thrown up on occasion. Since she takes them at the same time, she is not sure which one is causing her issue. She has also been having a lot more drainage from her left ischial wound. Her husband says that he has not been using the zinc oxide as much because it is difficult to wash off when he bathes her. We have been using Santyl to the new wound under silver alginate and silver alginate to the left ischial wound. 09/11/2021: Unfortunately, there has been fairly substantial breakdown of both of her wounds. It sounds like the gel compound that the  [employer] was being mixed in just leaked out everywhere and left the patient wet and macerated. There is worsening necrotic tissue and increased depth on the right ischial wound and the left one has extended further. They have not been using the zinc oxide as recommended. They contacted [facility] and were advised to simply apply the powder directly to the wounds. 10/09/2021: The right ischial wound has some necrotic tissue on the lateral aspect and into the base, but no purulent drainage and no surrounding erythema or induration. On the left ischial wound, there is leathery nonviable skin extending laterally from the main wound. No significant odor or drainage from this site, either. 11/05/2021: The right ischial wound has more necrotic fibrinous tissue at the base. The leathery eschar on the left ischial wound is beginning to lift at one of the edges and may be amenable to debridement today. We have been using topical powdered mupirocin to her wounds with silver alginate. 12/03/2021: Significant improvement in both of the wounds. There is still some fibrinous slough on the right ischial wound. There is also a heavy layer of slough and fibrinous exudate on the left ischial wound as well as some soft light slough on the remainder of the wound surface. We have been using Santyl for enzymatic debridement between clinic visits. 12/31/2021: The right ischial wound is cleaner today with very minimal slough. The left ischial wound has an area of devitalized subcutaneous tissue and the most lateral extension of the wound. There is slough and biofilm on the surface, but overall the wounds are looking significantly better. We have been unable to obtain Santyl and they have just been doing wet-to-dry dressing changes with good effect. Patient History Information obtained from Patient. Family History Unknown History, Cancer - Mother, Diabetes - Maternal Grandparents, Heart Disease - Mother, Hypertension - Mother,  Thyroid Problems - Mother, No family history of Hereditary Spherocytosis, Kidney Disease, Lung Disease, Seizures, Stroke, Tuberculosis. Social History Former smoker - ended on 11/06/2014, Marital Status - Married, Alcohol Use - Never, Drug  Use - No History, Caffeine Use - Daily - T soda. ea, Medical History Eyes Denies history of Cataracts, Glaucoma, Optic Neuritis Ear/Nose/Mouth/Throat Denies history of Chronic sinus problems/congestion, Middle ear problems Hematologic/Lymphatic Patient has history of Anemia Denies history of Hemophilia, Human Immunodeficiency Virus, Lymphedema, Sickle Cell Disease Respiratory Denies history of Aspiration, Asthma, Chronic Obstructive Pulmonary Disease (COPD), Pneumothorax, Sleep Apnea, Tuberculosis Cardiovascular Patient has history of Hypertension Denies history of Angina, Arrhythmia, Congestive Heart Failure, Coronary Artery Disease, Deep Vein Thrombosis, Hypotension, Myocardial Infarction, Peripheral Arterial Disease, Peripheral Venous Disease, Phlebitis, Vasculitis Gastrointestinal Denies history of Cirrhosis , Colitis, Crohnoos, Hepatitis A, Hepatitis B, Hepatitis C Endocrine Patient has history of Type II Diabetes Denies history of Type I Diabetes Genitourinary Denies history of End Stage Renal Disease Immunological Denies history of Lupus Erythematosus, Raynaudoos, Scleroderma Integumentary (Skin) Denies history of History of Burn Musculoskeletal Patient has history of Osteoarthritis Denies history of Gout, Rheumatoid Arthritis, Osteomyelitis Neurologic Patient has history of Dementia, Paraplegia Denies history of Neuropathy, Quadriplegia Oncologic Patient has history of Received Radiation - 2012 - right breast Psychiatric Denies history of Anorexia/bulimia, Confinement Anxiety Eastman, [name] ([id number]) [id number] Page 14 of 18 Hospitalization/Surgery History - left knee replacement. - right breast  lumpectomy. - cervical laminectomy with fusion. - hysterectomy. - tear duct surgery. - T and A. - UTI. - suprapubic cath placement. - UTI. Medical A Surgical History Notes nd Gastrointestinal constipation GERD Genitourinary UTI kidney stones neurogenic bladder Integumentary (Skin) left gluteal fold sacral Musculoskeletal cervical neuropathy osteomyelitis Psychiatric admitted for suicide risk on 12/02/2018 Objective Constitutional Slightly hypertensive. Slightly tachycardic, asymptomatic. No acute distress.. Vitals Time Taken: 10:26 AM, Height: 63 in, Weight: 185 lbs, BMI: 32.8, Temperature: 97.6 F, Pulse: 105 bpm, Respiratory Rate: 18 breaths/min, Blood Pressure: 144/74 mmHg. Respiratory Normal work of breathing on room air.. General Notes: 12/31/2021: The right ischial wound is cleaner today with very minimal slough. The left ischial wound has an area of devitalized subcutaneous tissue at the most lateral extension of the wound. There is slough and biofilm on the surface, but overall the wounds are looking significantly better. Integumentary (Hair, Skin) Wound #12 status is Open. Original cause of wound was Pressure Injury. The date acquired was: 09/06/2018. The wound has been in treatment 156 weeks. The wound is located on the Left Ischial Tuberosity. The wound measures 2.8cm length x 8.3cm width x 3.1cm depth; 18.253cm^2 area and 56.583cm^3 volume. There is muscle and Fat Layer (Subcutaneous Tissue) exposed. There is undermining starting at 2:00 and ending at 4:00 with a maximum distance of 1.8cm. There is a large amount of serosanguineous drainage noted. The wound margin is well defined and not attached to the wound base. There is large (67-100%) red, pink granulation within the wound bed. There is a small (1-33%) amount of necrotic tissue within the wound bed including Adherent Slough. Wound #15 status is Open. Original cause of wound was Pressure Injury. The date acquired was:  07/21/2021. The wound has been in treatment 22 weeks. The wound is located on the Right Ischium. The wound measures 2cm length x 1.8cm width x 1.8cm depth; 2.827cm^2 area and 5.089cm^3 volume. There is Fat Layer (Subcutaneous Tissue) exposed. There is no tunneling or undermining noted. There is a medium amount of serosanguineous drainage noted. The wound margin is distinct with the outline attached to the wound base. There is large (67-100%) red granulation within the wound bed. There is a small (1-33%) amount of necrotic tissue within the wound bed including Adherent Slough. Assessment  Active Problems ICD-10 Pressure ulcer of left buttock, stage 4 Type 2 diabetes mellitus with other skin ulcer Pressure ulcer of right buttock, stage 3 Procedures Wound #12 Pre-procedure diagnosis of Wound #12 is a Pressure Ulcer located on the Left Ischial Tuberosity . There was a Excisional Skin/Subcutaneous Tissue Debridement with a total area of 23.24 sq cm performed by Fredirick Maudlin, MD. With the following instrument(s): Curette to remove Viable and Non-Viable tissue/material. Material removed includes Subcutaneous Tissue, Slough, and Biofilm after achieving pain control using Lidocaine 4% T opical Solution. No specimens were taken. A time out was conducted at 11:10, prior to the start of the procedure. A Minimum amount of bleeding was controlled with Pressure. The procedure was tolerated well with a pain level of 0 throughout and a pain level of 0 following the procedure. Post Debridement Measurements: 2.8cm length x 8.3cm width x 3.1cm depth; 56.583cm^3 volume. Post debridement Stage noted as Category/Stage IV. Character of Wound/Ulcer Post Debridement is improved. Post procedure Diagnosis Wound #12: Same as Pre-Procedure General Notes: scribed by Baruch Gouty, RN for Dr. Celine Ahr. Wound #15 Pre-procedure diagnosis of Wound #15 is a Pressure Ulcer located on the Right Ischium . There was a  Selective/Open Wound Non-Viable Tissue Debridement with a total area of 3.6 sq cm performed by Fredirick Maudlin, MD. With the following instrument(s): Curette to remove Non-Viable tissue/material. Material removed includes Slough and Biofilm and after achieving pain control using Lidocaine 4% T opical Solution. No specimens were taken. A time out was [name], [name] ([id number]) [id number] Page 15 of 18 conducted at 11:10, prior to the start of the procedure. A Minimum amount of bleeding was controlled with Pressure. The procedure was tolerated well with a pain level of 0 throughout and a pain level of 0 following the procedure. Post Debridement Measurements: 2cm length x 1.8cm width x 1.8cm depth; 5.089cm^3 volume. Post debridement Stage noted as Category/Stage III. Character of Wound/Ulcer Post Debridement is improved. Post procedure Diagnosis Wound #15: Same as Pre-Procedure General Notes: scribed by Baruch Gouty, RN for Dr. Celine Ahr. Plan Follow-up Appointments: Return appointment in 1 month. - Dr. Celine Ahr - Room 1 ****[facility]**** Anesthetic: Wound #12 Left Ischial Tuberosity: (In clinic) Topical Lidocaine 4% applied to wound bed Wound #15 Right Ischium: (In clinic) Topical Lidocaine 4% applied to wound bed Bathing/ Shower/ Hygiene: May shower and wash wound with soap and water. - with dressing changes Edema Control - Lymphedema / SCD / Other: Elevate legs to the level of the heart or above for 30 minutes daily and/or when sitting, a frequency of: - throughout the day Off-Loading: Air fluidized (Group 3) mattress Turn and reposition every 2 hours - *** Try to shift from side to side and shift to belly (if tolerated),****avoid sitting up in bed except for meals Additional Orders / Instructions: Follow Nutritious Diet - protein shakes 2-3 times per day, Juven 2 times per day Home Health: New wound care orders this week; continue Home Health for wound care.  May utilize formulary equivalent dressing for wound treatment orders unless otherwise specified. Dressing changes to be completed by [facility] on Monday / Wednesday / Friday except when patient has scheduled visit at [facility]. - increase visits to 3 times per week (spouse having difficulty with dressing changes) Other Home Health Orders/Instructions: - [facility] The following medication(s) was prescribed: lidocaine topical 4 % cream cream topical for prior to debridement was prescribed at facility WOUND #12: - Ischial Tuberosity Wound Laterality: Left Cleanser: Soap and  Water 1 x Per Day/30 Days Discharge Instructions: May shower and wash wound with dial antibacterial soap and water prior to dressing change. Cleanser: Wound Cleanser (Generic) 1 x Per Day/30 Days Discharge Instructions: Cleanse the wound with wound cleanser prior to applying a clean dressing using gauze sponges, not tissue or cotton balls. Peri-Wound Care: Skin Prep (Generic) 1 x Per Day/30 Days Discharge Instructions: Use skin prep as directed Prim Dressing: Medline Woven Gauze Sponges 4x4 (in/in) 1 x Per Day/30 Days ary Discharge Instructions: moisten with saline and pack lightly into wound Secondary Dressing: Zetuvit Plus Silicone Border Dressing 5x5 (in/in) (Generic) 1 x Per Day/30 Days Discharge Instructions: Apply silicone border or ABD padover primary dressing as directed. WOUND #15: - Ischium Wound Laterality: Right Cleanser: Soap and Water 1 x Per Day/30 Days Discharge Instructions: May shower and wash wound with dial antibacterial soap and water prior to dressing change. Cleanser: Wound Cleanser (Generic) 1 x Per Day/30 Days Discharge Instructions: Cleanse the wound with wound cleanser prior to applying a clean dressing using gauze sponges, not tissue or cotton balls. Peri-Wound Care: Skin Prep (Generic) 1 x Per Day/30 Days Discharge Instructions: Use skin prep as directed Prim Dressing: Medline Woven  Gauze Sponges 4x4 (in/in) 1 x Per Day/30 Days ary Discharge Instructions: moisten with saline and pack lightly into wound Secondary Dressing: Zetuvit Plus Silicone Border Dressing 5x5 (in/in) (Generic) 1 x Per Day/30 Days Discharge Instructions: Apply silicone border or ABD padover primary dressing as directed. 12/31/2021: The right ischial wound is cleaner today with very minimal slough. The left ischial wound has an area of devitalized subcutaneous tissue at the most lateral extension of the wound. There is slough and biofilm on the surface, but overall the wounds are looking significantly better. I used a curette to debride slough and nonviable subcutaneous tissue from her wounds. We will continue doing wet-to-dry saline dressing changes. Of note, her broken ankle is in a hard cast, but Dr. Jacqualyn Posey, the podiatrist who is seeing her, mentioned that there is a wound in the area that he is concerned about. We are unable to visualize it secondary to the cast. He currently has been taking doxycycline and she has follow-up with him this Friday. Once we are able to evaluate the wound, we can certainly make a plan for its treatment if warranted. In addition, we have been trying to obtain a group 3 mattress for the patient. This has been something of a challenge due to her being out of network with our usual vendors. However the patient is not improving on her current therapeutic air mattress. She requires the use of the HydroAire air-fluidized therapy bed due to full bed fluidization that is not possible with other AFT beds. No substitutions can be made; without the use of this bed, the patient may require hospitalization. Electronic Signature(s) Signed: 01/27/2022 10:54:16 AM By: Fredirick Maudlin MD FACS Previous Signature: 12/31/2021 11:29:06 AM Version By: Fredirick Maudlin MD FACS Entered By: Fredirick Maudlin on 01/27/2022 10:54:16 [name], [name] ([id number]) [id number]  Page 16 of 18 -------------------------------------------------------------------------------- HxROS Details Patient Name: Date of Service: MARZELLA, [name] 12/31/2021 10:30 A M Medical Record Number: [id number] Patient Account Number: [id number] Date of Birth/Sex: Treating RN: 04/25/1950 (71 y.o. F) Primary Care Provider: [name] Other Clinician: Referring Provider: Treating Provider/Extender: Nance Pew in Treatment: 59 Information Obtained From Patient Eyes Medical History: Negative for: Cataracts; Glaucoma; Optic Neuritis Ear/Nose/Mouth/Throat Medical History: Negative for: Chronic sinus problems/congestion; Middle ear problems  Hematologic/Lymphatic Medical History: Positive for: Anemia Negative for: Hemophilia; Human Immunodeficiency Virus; Lymphedema; Sickle Cell Disease Respiratory Medical History: Negative for: Aspiration; Asthma; Chronic Obstructive Pulmonary Disease (COPD); Pneumothorax; Sleep Apnea; Tuberculosis Cardiovascular Medical History: Positive for: Hypertension Negative for: Angina; Arrhythmia; Congestive Heart Failure; Coronary Artery Disease; Deep Vein Thrombosis; Hypotension; Myocardial Infarction; Peripheral Arterial Disease; Peripheral Venous Disease; Phlebitis; Vasculitis Gastrointestinal Medical History: Negative for: Cirrhosis ; Colitis; Crohns; Hepatitis A; Hepatitis B; Hepatitis C Past Medical History Notes: constipation GERD Endocrine Medical History: Positive for: Type II Diabetes Negative for: Type I Diabetes Time with diabetes: 1 year Treated with: Oral agents Blood sugar tested every day: No Genitourinary Medical History: Negative for: End Stage Renal Disease Past Medical History Notes: UTI kidney stones neurogenic bladder Immunological Medical History: Negative for: Lupus Erythematosus; Raynauds; Scleroderma Integumentary (Skin) [name], [name] ([id number]) [id number]  Page 17 of 18 Medical History: Negative for: History of Burn Past Medical History Notes: left gluteal fold sacral Musculoskeletal Medical History: Positive for: Osteoarthritis Negative for: Gout; Rheumatoid Arthritis; Osteomyelitis Past Medical History Notes: cervical neuropathy osteomyelitis Neurologic Medical History: Positive for: Dementia; Paraplegia Negative for: Neuropathy; Quadriplegia Oncologic Medical History: Positive for: Received Radiation - 2012 - right breast Psychiatric Medical History: Negative for: Anorexia/bulimia; Confinement Anxiety Past Medical History Notes: admitted for suicide risk on 12/02/2018 Immunizations Pneumococcal Vaccine: Received Pneumococcal Vaccination: Yes Received Pneumococcal Vaccination On or After 60th Birthday: No Implantable Devices None Hospitalization / Surgery History Type of Hospitalization/Surgery left knee replacement right breast lumpectomy cervical laminectomy with fusion hysterectomy tear duct surgery Tand A UTI suprapubic cath placement UTI Family and Social History Unknown History: Yes; Cancer: Yes - Mother; Diabetes: Yes - Maternal Grandparents; Heart Disease: Yes - Mother; Hereditary Spherocytosis: No; Hypertension: Yes - Mother; Kidney Disease: No; Lung Disease: No; Seizures: No; Stroke: No; Thyroid Problems: Yes - Mother; Tuberculosis: No; Former smoker - ended on 11/06/2014; Marital Status - Married; Alcohol Use: Never; Drug Use: No History; Caffeine Use: Daily - T soda; Financial Concerns: No; ea, Food, Clothing or Shelter Needs: No; Support System Lacking: No; Transportation Concerns: No Electronic Signature(s) Signed: 12/31/2021 12:12:30 PM By: Fredirick Maudlin MD FACS Entered By: Fredirick Maudlin on 12/31/2021 11:24:46 -------------------------------------------------------------------------------- SuperBill Details Patient Name: Date of Service: [name] 12/31/2021 Medical Record Number:  [id number] Patient Account Number: [id number] Date of Birth/Sex: Treating RN: 1950/08/31 (71 y.o. 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, stage 4 E11.622 Type 2 diabetes mellitus with other skin ulcer L89.313 Pressure ulcer of right buttock, stage 3 Facility Procedures : The patient participates with Medicare or their insurance follows the Medicare Facility Guidelines: CPT4 Code Description Modifier Quantity [id number] 11042 - DEB SUBQ TISSUE 20 SQ CM/< 1 ICD-10 Diagnosis Description 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,  stage 4 : The patient participates with Medicare or their insurance follows the Medicare Facility Guidelines: [id number] 11045 - DEB SUBQ TISS EA ADDL 20CM 1 ICD-10 Diagnosis Description 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, stage 4 : The patient participates with Medicare or their insurance follows the Medicare Facility Guidelines: [id number] - DEBRIDE WOUND 1ST 20 SQ CM OR < 1 ICD-10 Diagnosis Description L89.313 Pressure ulcer of right buttock, stage 3 Physician Procedures : CPT4 Code Description Modifier [id number] - WC PHYS LEVEL 4 - EST PT 25 ICD-10  Diagnosis Description 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, stage 4 L89.313 Pressure ulcer of right buttock, stage 3 E11.622 Type 2 diabetes mellitus with other skin  ulcer Quantity: 1 : [id number] 11042 - WC PHYS SUBQ TISS 20 SQ CM ICD-10 Diagnosis Description 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

## 2021-12-31 NOTE — Progress Notes (Signed)
ESMA, KILTS ([id number]) Visit Report for 12/31/2021 Arrival Information Details Patient Name: Date of Service: DELESHA, POHLMAN 12/31/2021 10:30 A M Medical Record Number: [id number] Patient Account Number: [id number] Date of Birth/Sex: Treating RN: [birth year] (71 y.o. [name] Primary Care Taniya Dasher: [name] Other Clinician: Referring Laiden Milles: Treating Gianpaolo Mindel/Extender: Nance Pew in Treatment: 28 Visit Information History Since Last Visit Added or deleted any medications: Yes Patient Arrived: Wheel Chair Any new allergies or adverse reactions: No Arrival Time: 10:19 Had a fall or experienced change in No Accompanied By: spouse activities of daily living that may affect Transfer Assistance: Harrel Lemon Lift risk of falls: Patient Identification Verified: Yes Signs or symptoms of abuse/neglect since last visito No Secondary Verification Process Completed: Yes Hospitalized since last visit: Yes Patient Requires Transmission-Based Precautions: No Implantable device outside of the clinic excluding No Patient Has Alerts: No cellular tissue based products placed in the center since last visit: Has Dressing in Place as Prescribed: Yes Pain Present Now: No Electronic Signature(s) Signed: 12/31/2021 5:30:40 PM By: Baruch Gouty RN, BSN Entered By: Baruch Gouty on 12/31/2021 10:26:30 -------------------------------------------------------------------------------- Encounter Discharge Information Details Patient Name: Date of Service: [name] 12/31/2021 10:30 A M Medical Record Number: [id number] Patient Account Number: [id number] Date of Birth/Sex: Treating RN: September 13, 1950 (71 y.o. [name] Primary Care Emilyanne Mcgough: [name] Other Clinician: Referring Noni Stonesifer: Treating Arval Brandstetter/Extender: Nance Pew in Treatment: 156 Encounter Discharge Information Items Post Procedure Vitals Discharge Condition:  Stable Temperature (F): 97.6 Ambulatory Status: Wheelchair Pulse (bpm): 105 Discharge Destination: Home Respiratory Rate (breaths/min): 18 Transportation: Private Auto Blood Pressure (mmHg): 144/74 Accompanied By: spouse Schedule Follow-up Appointment: Yes Clinical Summary of Care: Patient Declined Electronic Signature(s) Signed: 12/31/2021 5:30:40 PM By: Baruch Gouty RN, BSN Entered By: Baruch Gouty on 12/31/2021 16:23:25 -------------------------------------------------------------------------------- Lower Extremity Assessment Details Patient Name: Date of Service: [name] 12/31/2021 10:30 A M Medical Record Number: [id number] Patient Account Number: [id number] Date of Birth/Sex: Treating RN: 1950-07-30 (71 y.o. [name] Primary Care Nylani Michetti: [name] Other Clinician: Referring Vedh Ptacek: Treating Amreen Raczkowski/Extender: Burgess Amor Weeks in Treatment: 156 Electronic Signature(s) Signed: 12/31/2021 5:30:40 PM By: Baruch Gouty RN, BSN Entered By: Baruch Gouty on 12/31/2021 10:51:13 -------------------------------------------------------------------------------- Multi Wound Chart Details Patient Name: Date of Service: [name], [name]. 12/31/2021 10:30 A M Medical Record Number: [id number] Patient Account Number: [id number] Date of Birth/Sex: Treating RN: 15-Aug-1950 (71 y.o. F) Primary Care Trung Wenzl: [name] Other Clinician: Referring Legend Tumminello: Treating Jashawn Floyd/Extender: Nance Pew in Treatment: 156 Vital Signs Height(in): 34 Pulse(bpm): 105 Weight(lbs): 185 Blood Pressure(mmHg): 144/74 Body Mass Index(BMI): 32.8 Temperature(F): 97.6 Respiratory Rate(breaths/min): 18 Photos: [N/A:N/A] Left Ischial Tuberosity Right Ischium N/A Wound Location: Pressure Injury Pressure Injury N/A Wounding Event: Pressure Ulcer Pressure Ulcer N/A Primary Etiology: Anemia, Hypertension, Type II Anemia,  Hypertension, Type II N/A Comorbid History: Diabetes, Osteoarthritis, Dementia, Diabetes, Osteoarthritis, Dementia, Paraplegia, Received Radiation Paraplegia, Received Radiation 09/06/2018 07/21/2021 N/A Date Acquired: 156 45 N/A Weeks of Treatment: Open Open N/A Wound Status: No No N/A Wound Recurrence: 2.8x8.3x3.1 2x1.8x1.8 N/A Measurements L x W x D (cm) 18.253 2.827 N/A A (cm) : rea 56.583 5.089 N/A Volume (cm) : -7634.30% 83.00% N/A % Reduction in A rea: -26590.10% -206.40% N/A % Reduction in Volume: 2 Starting Position 1 (o'clock): 4 Ending Position 1 (o'clock): 1.8 Maximum Distance 1 (cm): Yes No N/A Undermining: Category/Stage IV Category/Stage III N/A Classification: Large Medium  N/A Exudate A mount: Serosanguineous Serosanguineous N/A Exudate Type: red, brown red, brown N/A Exudate Color: Well defined, not attached Distinct, outline attached N/A Wound Margin: Large (67-100%) Large (67-100%) N/A Granulation Amount: Red, Pink Red N/A Granulation Quality: Small (1-33%) Small (1-33%) N/A Necrotic Amount: Fat Layer (Subcutaneous Tissue): Yes Fat Layer (Subcutaneous Tissue): Yes N/A Exposed Structures: Muscle: Yes Fascia: No Fascia: No Tendon: No Tendon: No Muscle: No Joint: No Joint: No Bone: No Bone: No Small (1-33%) None N/A Epithelialization: Debridement - Excisional Debridement - Selective/Open Wound N/A Debridement: Pre-procedure Verification/Time Out 11:10 11:10 N/A Taken: Lidocaine 4% Topical Solution Lidocaine 4% Topical Solution N/A Pain Control: Subcutaneous, [employer] N/A Tissue Debrided: Skin/Subcutaneous Tissue Non-Viable Tissue N/A Level: 23.24 3.6 N/A Debridement A (sq cm): rea Curette Curette N/A Instrument: Minimum Minimum N/A Bleeding: Pressure Pressure N/A Hemostasis A chieved: 0 0 N/A Procedural Pain: 0 0 N/A Post Procedural Pain: Procedure was tolerated well Procedure was tolerated well N/A Debridement  Treatment Response: 2.8x8.3x3.1 2x1.8x1.8 N/A Post Debridement Measurements L x W x D (cm) 56.583 5.089 N/A Post Debridement Volume: (cm) Category/Stage IV Category/Stage III N/A Post Debridement Stage: Debridement Debridement N/A Procedures Performed: Treatment Notes Electronic Signature(s) Signed: 12/31/2021 11:21:14 AM By: Fredirick Maudlin MD FACS Entered By: Fredirick Maudlin on 12/31/2021 11:21:14 -------------------------------------------------------------------------------- Multi-Disciplinary Care Plan Details Patient Name: Date of Service: [name], [name]. 12/31/2021 10:30 A M Medical Record Number: [id number] Patient Account Number: [id number] Date of Birth/Sex: Treating RN: 1950-05-12 (71 y.o. [name] Primary Care Laquan Ludden: [name] Other Clinician: Referring Zacory Fiola: Treating Babyboy Loya/Extender: Nance Pew in Treatment: [place] reviewed with physician Active Inactive Pressure Nursing Diagnoses: Knowledge deficit related to management of pressures ulcers Potential for impaired tissue integrity related to pressure, friction, moisture, and shear Goals: Patient/caregiver will verbalize understanding of pressure ulcer management Date Initiated: 05/22/2021 Target Resolution Date: 01/28/2022 Goal Status: Active Interventions: Assess: immobility, friction, shearing, incontinence upon admission and as needed Assess offloading mechanisms upon admission and as needed Notes: Wound/Skin Impairment Nursing Diagnoses: Knowledge deficit related to ulceration/compromised skin integrity Goals: Patient/caregiver will verbalize understanding of skin care regimen Date Initiated: 12/30/2018 Target Resolution Date: 01/28/2022 Goal Status: Active Interventions: Assess patient/caregiver ability to obtain necessary supplies Assess patient/caregiver ability to perform ulcer/skin care regimen upon admission and as  needed Assess ulceration(s) every visit Provide education on ulcer and skin care Treatment Activities: Skin care regimen initiated : 12/30/2018 Topical wound management initiated : 12/30/2018 Notes: 01/08/21: Wound care regimen continues Electronic Signature(s) Signed: 12/31/2021 5:30:40 PM By: Baruch Gouty RN, BSN Entered By: Baruch Gouty on 12/31/2021 11:04:28 -------------------------------------------------------------------------------- Pain Assessment Details Patient Name: Date of Service: [name] 12/31/2021 10:30 A M Medical Record Number: [id number] Patient Account Number: [id number] Date of Birth/Sex: Treating RN: 19-Dec-1950 (72 y.o. [name] Primary Care Alazay Leicht: [name] Other Clinician: Referring Xzander Gilham: Treating Liam Cammarata/Extender: Nance Pew in Treatment: 156 Active Problems Location of Pain Severity and Description of Pain Patient Has Paino No Site Locations Rate the pain. Current Pain Level: 0 Pain Management and Medication Current Pain Management: Electronic Signature(s) Signed: 12/31/2021 5:30:40 PM By: Baruch Gouty RN, BSN Entered By: Baruch Gouty on 12/31/2021 10:27:20 -------------------------------------------------------------------------------- Patient/Caregiver Education Details Patient Name: Date of Service: [name] 9/26/2023andnbsp10:30 A M Medical Record Number: [id number] Patient Account Number: [id number] Date of Birth/Gender: Treating RN: 11/29/1950 (71 y.o. [name] Primary Care Physician: [name] Other Clinician: Referring Physician: Treating Physician/Extender:  Fredirick Maudlin [name] Weeks in Treatment: 156 Education Assessment Education Provided To: Patient Education Topics Provided Pressure: Methods: Explain/Verbal Responses: Reinforcements needed, State content correctly Wound/Skin Impairment: Methods: Explain/Verbal Responses: Reinforcements  needed, State content correctly Electronic Signature(s) Signed: 12/31/2021 5:30:40 PM By: Baruch Gouty RN, BSN Entered By: Baruch Gouty on 12/31/2021 11:04:52 -------------------------------------------------------------------------------- Wound Assessment Details Patient Name: Date of Service: [name] 12/31/2021 10:30 A M Medical Record Number: [id number] Patient Account Number: [id number] Date of Birth/Sex: Treating RN: [birth year] (71 y.o. Martyn Malay, Vaughan Basta Primary Care Marchia Diguglielmo: [name] Other Clinician: Referring Reyana Leisey: Treating Kamrynn Melott/Extender: Burgess Amor Weeks in Treatment: 156 Wound Status Wound Number: 12 Primary Pressure Ulcer Etiology: Wound Location: Left Ischial Tuberosity Wound Open Wounding Event: Pressure Injury Status: Date Acquired: 09/06/2018 Comorbid Anemia, Hypertension, Type II Diabetes, Osteoarthritis, Weeks Of Treatment: 156 History: Dementia, Paraplegia, Received Radiation Clustered Wound: No Photos Wound Measurements Length: (cm) 2.8 Width: (cm) 8.3 Depth: (cm) 3.1 Area: (cm) 18.253 Volume: (cm) 56.583 % Reduction in Area: -7634.3% % Reduction in Volume: -26590.1% Epithelialization: Small (1-33%) Undermining: Yes Starting Position (o'clock): 2 Ending Position (o'clock): 4 Maximum Distance: (cm) 1.8 Wound Description Classification: Category/Stage IV Wound Margin: Well defined, not attached Exudate Amount: Large Exudate Type: Serosanguineous Exudate Color: red, brown Foul Odor After Cleansing: No Slough/Fibrino Yes Wound Bed Granulation Amount: Large (67-100%) Exposed Structure Granulation Quality: Red, Pink Fascia Exposed: No Necrotic Amount: Small (1-33%) Fat Layer (Subcutaneous Tissue) Exposed: Yes Necrotic Quality: Adherent Slough Tendon Exposed: No Muscle Exposed: Yes Necrosis of Muscle: No Joint Exposed: No Bone Exposed: No Treatment Notes Wound #12 (Ischial Tuberosity) Wound  Laterality: Left Cleanser Soap and Water Discharge Instruction: May shower and wash wound with dial antibacterial soap and water prior to dressing change. Wound Cleanser Discharge Instruction: Cleanse the wound with wound cleanser prior to applying a clean dressing using gauze sponges, not tissue or cotton balls. Peri-Wound Care Skin Prep Discharge Instruction: Use skin prep as directed Topical Primary Dressing Medline Woven Gauze Sponges 4x4 (in/in) Discharge Instruction: moisten with saline and pack lightly into wound Secondary Dressing Zetuvit Plus Silicone Border Dressing 5x5 (in/in) Discharge Instruction: Apply silicone border or ABD padover primary dressing as directed. Secured With Compression Wrap Compression Stockings Environmental education officer) Signed: 12/31/2021 5:30:40 PM By: Baruch Gouty RN, BSN Entered By: Baruch Gouty on 12/31/2021 11:01:36 -------------------------------------------------------------------------------- Wound Assessment Details Patient Name: Date of Service: [name] 12/31/2021 10:30 A M Medical Record Number: [id number] Patient Account Number: [id number] Date of Birth/Sex: Treating RN: Sep 22, 1950 (71 y.o. [name] Primary Care Analese Sovine: Other Clinician: [name] Referring Inara Dike: Treating Marenda Accardi/Extender: Nance Pew in Treatment: 156 Wound Status Wound Number: 15 Primary Pressure Ulcer Etiology: Wound Location: Right Ischium Wound Open Wounding Event: Pressure Injury Status: Date Acquired: 07/21/2021 Comorbid Anemia, Hypertension, Type II Diabetes, Osteoarthritis, Weeks Of Treatment: 22 History: Dementia, Paraplegia, Received Radiation Clustered Wound: No Photos Wound Measurements Length: (cm) 2 Width: (cm) 1.8 Depth: (cm) 1.8 Area: (cm) 2.827 Volume: (cm) 5.089 % Reduction in Area: 83% % Reduction in Volume: -206.4% Epithelialization: None Tunneling: No Undermining:  No Wound Description Classification: Category/Stage III Wound Margin: Distinct, outline attached Exudate Amount: Medium Exudate Type: Serosanguineous Exudate Color: red, brown Foul Odor After Cleansing: No Slough/Fibrino Yes Wound Bed Granulation Amount: Large (67-100%) Exposed Structure Granulation Quality: Red Fascia Exposed: No Necrotic Amount: Small (1-33%) Fat Layer (Subcutaneous Tissue) Exposed: Yes Necrotic Quality: Adherent Slough Tendon Exposed: No Muscle Exposed: No Joint Exposed: No  Bone Exposed: No Treatment Notes Wound #15 (Ischium) Wound Laterality: Right Cleanser Soap and Water Discharge Instruction: May shower and wash wound with dial antibacterial soap and water prior to dressing change. Wound Cleanser Discharge Instruction: Cleanse the wound with wound cleanser prior to applying a clean dressing using gauze sponges, not tissue or cotton balls. Peri-Wound Care Skin Prep Discharge Instruction: Use skin prep as directed Topical Primary Dressing Medline Woven Gauze Sponges 4x4 (in/in) Discharge Instruction: moisten with saline and pack lightly into wound Secondary Dressing Zetuvit Plus Silicone Border Dressing 5x5 (in/in) Discharge Instruction: Apply silicone border or ABD padover primary dressing as directed. Secured With Compression Wrap Compression Stockings Environmental education officer) Signed: 12/31/2021 5:30:40 PM By: Baruch Gouty RN, BSN Entered By: Baruch Gouty on 12/31/2021 11:00:59 -------------------------------------------------------------------------------- Vitals Details Patient Name: Date of Service: [name], [name]. 12/31/2021 10:30 A M Medical Record Number: [id number] Patient Account Number: [id number] Date of Birth/Sex: Treating RN: 03/19/1951 (71 y.o. Martyn Malay, Vaughan Basta Primary Care Mccade Sullenberger: [name] Other Clinician: Referring Nellie Pester: Treating Shailey Butterbaugh/Extender: Nance Pew in Treatment:  156 Vital Signs Time Taken: 10:26 Temperature (F): 97.6 Height (in): 63 Pulse (bpm): 105 Weight (lbs): 185 Respiratory Rate (breaths/min): 18 Body Mass Index (BMI): 32.8 Blood Pressure (mmHg): 144/74 Reference Range: 80 - 120 mg / dl Electronic Signature(s) Signed: 12/31/2021 5:30:40 PM By: Baruch Gouty RN, BSN Entered By: Baruch Gouty on 12/31/2021 10:26:52

## 2021-12-31 NOTE — Telephone Encounter (Signed)
Called patient , no answer, left message to call our office back if she is experiencing any fever or chills.

## 2021-12-31 NOTE — Telephone Encounter (Signed)
Patient is calling back to confirm appointment on 01/03/22

## 2022-01-01 ENCOUNTER — Encounter: Payer: Medicare Other | Admitting: Family Medicine

## 2022-01-02 NOTE — Progress Notes (Signed)
Subjective: Chief Complaint  Patient presents with   Foot Pain    71 year old female presents the office with her husband for follow-up evaluation of right ankle fracture.  She states she is doing okay.  Pain is manageable.  She is noticing a lot of drainage coming out of the ankle.  No purulent drainage mostly clear.  Objective: AAO x3, NAD DP/PT pulses palpable bilaterally, CRT less than 3 seconds Upon removal of the bandage to the lateral ankle there is a full-thickness ulceration noted to drain very concerned about likely there is no drainage or pus.  There is no erythema or warmth.  Instability of the ankle noted. No pain with calf compression, swelling, warmth, erythema  Assessment: Full-thickness ulceration, ankle fracture  Plan: -All treatment options discussed with the patient including all alternatives, risks, complications.  -Continue antibiotics. -No clinical signs of infection noted.  Open wound still noted.  Given the nature of the fracture in the setting of noncompliance I believe patient will benefit from cast immobilization.  Cast was applied in standard technique. -Awaiting ABIs -I discussed this plan with Dr. Jacqualyn Posey and he is in agreement. -She will be scheduled to see Dr. Jacqualyn Posey for reapplication of cast and to assess the wound.  Felipa Furnace DPM

## 2022-01-03 ENCOUNTER — Ambulatory Visit: Payer: Medicare Other | Admitting: Podiatry

## 2022-01-03 ENCOUNTER — Ambulatory Visit (INDEPENDENT_AMBULATORY_CARE_PROVIDER_SITE_OTHER): Payer: Medicare Other

## 2022-01-03 DIAGNOSIS — L97312 Non-pressure chronic ulcer of right ankle with fat layer exposed: Secondary | ICD-10-CM | POA: Diagnosis not present

## 2022-01-03 DIAGNOSIS — S82891D Other fracture of right lower leg, subsequent encounter for closed fracture with routine healing: Secondary | ICD-10-CM

## 2022-01-03 DIAGNOSIS — I739 Peripheral vascular disease, unspecified: Secondary | ICD-10-CM

## 2022-01-06 NOTE — Progress Notes (Signed)
Subjective: No chief complaint on file.    71 year old female presents the office with her husband for follow-up evaluation of right ankle fracture.  She was seen last appoint with Dr. Posey Pronto was placed into a cast given the ulceration, instability of the ankle.  She has no sensation to her ankle and she is nonambulatory.  She denies any fevers or chills.  She is still on doxycycline. Objective: AAO x3, NAD DP/PT pulses palpable bilaterally, CRT less than 3 seconds Upon removal of the bandage to the lateral ankle there is a full-thickness ulceration noted to the lateral aspect the ankle but not able to probe probe to bone however there is close..  There is no erythema or warmth.  Instability of the ankle noted. No pain with calf compression, swelling, warmth, erythema  Assessment: Full-thickness ulceration, ankle fracture  Plan: -All treatment options discussed with the patient including all alternatives, risks, complications.  -Repeat x-rays were obtained of the left ankle.  I did the best views possible.  Fracture still noted the tibia, fibula. -Continue antibiotics. -Wound is still open which I am very concerned about.  I previously ordered an ABI.  She has not finished the course of doxycycline.  Betadine dressing was applied.  I reapplied a below-knee fiberglass cast for immobilization making sure to pad all bony prominences -Awaiting ABIs-.  They have received a call about this they state but have not scheduled. -I discussed this case with the group as well.  I do not feel she is a surgical candidate especially now there is an open ulcer. -Monitor for any clinical signs or symptoms of infection and directed to call the office immediately should any occur or go to the ER.  Felipa Furnace DPM

## 2022-01-10 ENCOUNTER — Other Ambulatory Visit: Payer: Medicare Other

## 2022-01-10 ENCOUNTER — Ambulatory Visit: Payer: Medicare Other | Admitting: Podiatry

## 2022-01-10 DIAGNOSIS — L97312 Non-pressure chronic ulcer of right ankle with fat layer exposed: Secondary | ICD-10-CM

## 2022-01-10 DIAGNOSIS — S82891D Other fracture of right lower leg, subsequent encounter for closed fracture with routine healing: Secondary | ICD-10-CM | POA: Diagnosis not present

## 2022-01-13 ENCOUNTER — Telehealth: Payer: Self-pay

## 2022-01-13 MED ORDER — DOXYCYCLINE HYCLATE 100 MG PO TABS: 100.0000 mg | ORAL_TABLET | Freq: Two times a day (BID) | ORAL | 0 refills | Status: DC

## 2022-01-13 NOTE — Telephone Encounter (Signed)
Left a detailed vm advised the patient and spouse that medication was sent to the pharmacy.

## 2022-01-13 NOTE — Progress Notes (Signed)
Subjective: Chief Complaint  Patient presents with   Wound Check    Patient came in today for a right ankle fracture and wound check     71 year old female presents the office with her husband for follow-up evaluation of right ankle fracture.  She presents today for cast change.  She states that she has been doing well.  Still just finished doxycycline.  The husband asked about refilling this.  Objective: AAO x3, NAD DP/PT pulses palpable bilaterally, CRT less than 3 seconds Upon removal of the bandage to the lateral ankle there is a full-thickness ulceration noted to the lateral aspect the ankle and appears to be somewhat smaller there is no probing to bone although it still closed.  There is increased granulation tissue present.  There is no purulence noted.  Some bleeding noted.  No surrounding erythema, ascending cellulitis.  No fluctuation or crepitation there is no malodor.  Ankle seems to be somewhat more stable today.  No pain with calf compression, swelling, warmth, erythema  Assessment: Full-thickness ulceration, ankle fracture  Plan: -All treatment options discussed with the patient including all alternatives, risks, complications.  -Dressing was changed today as well as the cast.  I cleaned the wound with saline.  Betadine dressing was applied followed by a dry sterile dressing.  A well-padded below-knee fiberglass cast was applied making sure to pad all bony prominences. -Continue nonweightbearing -We will follow-up ABI -Monitor for any clinical signs or symptoms of infection and directed to call the office immediately should any occur or go to the ER.  X-ray ankle next appointment  Trula Slade DPM

## 2022-01-17 ENCOUNTER — Ambulatory Visit: Payer: Medicare Other | Admitting: Podiatry

## 2022-01-17 ENCOUNTER — Ambulatory Visit (INDEPENDENT_AMBULATORY_CARE_PROVIDER_SITE_OTHER): Payer: Medicare Other

## 2022-01-17 DIAGNOSIS — S82891D Other fracture of right lower leg, subsequent encounter for closed fracture with routine healing: Secondary | ICD-10-CM

## 2022-01-17 DIAGNOSIS — L97312 Non-pressure chronic ulcer of right ankle with fat layer exposed: Secondary | ICD-10-CM | POA: Diagnosis not present

## 2022-01-19 NOTE — Progress Notes (Signed)
Subjective: No chief complaint on file.   71 year old female presents the office with her husband for follow-up evaluation of right ankle fracture.  She presents today for cast change.  Still on doxycycline.  No fevers or chills.  Not yet had the ABI performed.  Objective: AAO x3, NAD DP/PT pulses palpable bilaterally, CRT less than 3 seconds Upon removal of the bandage to the lateral ankle there is a full-thickness ulceration noted to the lateral aspect the ankle and appears to be somewhat smaller there is no probing to bone although it still closed.  The central aspect has the probing but the rest of the wound is more superficial.  Overall ankle appears to be more stable. No pain with calf compression, swelling, warmth, erythema     Assessment: Full-thickness ulceration, ankle fracture  Plan: -All treatment options discussed with the patient including all alternatives, risks, complications.  -X-ray today reviewed.  Fracture noted the distal tibia, fibula.  No evidence of acute osteomyelitis. -Clean the wound with saline.  Small mount of Betadine followed by a nonstick dressing and a dry sterile dressing applied.  I then applied a below-knee fiberglass cast making sure to pad all bony prominences. -She is nonweightbearing at baseline. We again discussed risk of amputation unfortunately. -Monitor for any clinical signs or symptoms of infection and directed to call the office immediately should any occur or go to the ER.  Follow-up in 1 week.   Trula Slade DPM

## 2022-01-24 ENCOUNTER — Ambulatory Visit (INDEPENDENT_AMBULATORY_CARE_PROVIDER_SITE_OTHER): Payer: Medicare Other

## 2022-01-24 ENCOUNTER — Ambulatory Visit (INDEPENDENT_AMBULATORY_CARE_PROVIDER_SITE_OTHER): Payer: Medicare Other | Admitting: Podiatry

## 2022-01-24 DIAGNOSIS — S82891D Other fracture of right lower leg, subsequent encounter for closed fracture with routine healing: Secondary | ICD-10-CM | POA: Diagnosis not present

## 2022-01-24 DIAGNOSIS — L97312 Non-pressure chronic ulcer of right ankle with fat layer exposed: Secondary | ICD-10-CM | POA: Diagnosis not present

## 2022-01-24 MED ORDER — DOXYCYCLINE HYCLATE 100 MG PO TABS: 100.0000 mg | ORAL_TABLET | Freq: Two times a day (BID) | ORAL | 0 refills | Status: DC

## 2022-01-25 ENCOUNTER — Emergency Department (HOSPITAL_COMMUNITY): Payer: Medicare Other

## 2022-01-25 ENCOUNTER — Inpatient Hospital Stay (HOSPITAL_COMMUNITY)
Admission: EM | Admit: 2022-01-25 | Discharge: 2022-01-28 | DRG: 698 | Disposition: A | Discharge status: Home / Self Care | Payer: Medicare Other | Attending: Internal Medicine | Admitting: Internal Medicine

## 2022-01-25 ENCOUNTER — Inpatient Hospital Stay (HOSPITAL_COMMUNITY): Payer: Medicare Other

## 2022-01-25 ENCOUNTER — Other Ambulatory Visit: Payer: Self-pay

## 2022-01-25 ENCOUNTER — Encounter (HOSPITAL_COMMUNITY): Payer: Self-pay

## 2022-01-25 DIAGNOSIS — N1832 Chronic kidney disease, stage 3b: Secondary | ICD-10-CM | POA: Diagnosis present

## 2022-01-25 DIAGNOSIS — Z87891 Personal history of nicotine dependence: Secondary | ICD-10-CM | POA: Diagnosis not present

## 2022-01-25 DIAGNOSIS — Z9359 Other cystostomy status: Secondary | ICD-10-CM

## 2022-01-25 DIAGNOSIS — Z885 Allergy status to narcotic agent status: Secondary | ICD-10-CM

## 2022-01-25 DIAGNOSIS — L89152 Pressure ulcer of sacral region, stage 2: Secondary | ICD-10-CM | POA: Diagnosis present

## 2022-01-25 DIAGNOSIS — Z853 Personal history of malignant neoplasm of breast: Secondary | ICD-10-CM | POA: Diagnosis not present

## 2022-01-25 DIAGNOSIS — I130 Hypertensive heart and chronic kidney disease with heart failure and stage 1 through stage 4 chronic kidney disease, or unspecified chronic kidney disease: Secondary | ICD-10-CM | POA: Diagnosis present

## 2022-01-25 DIAGNOSIS — G8220 Paraplegia, unspecified: Secondary | ICD-10-CM | POA: Diagnosis present

## 2022-01-25 DIAGNOSIS — Z79890 Hormone replacement therapy (postmenopausal): Secondary | ICD-10-CM

## 2022-01-25 DIAGNOSIS — T68XXXA Hypothermia, initial encounter: Secondary | ICD-10-CM | POA: Diagnosis not present

## 2022-01-25 DIAGNOSIS — E7800 Pure hypercholesterolemia, unspecified: Secondary | ICD-10-CM | POA: Diagnosis present

## 2022-01-25 DIAGNOSIS — E875 Hyperkalemia: Secondary | ICD-10-CM | POA: Diagnosis present

## 2022-01-25 DIAGNOSIS — E119 Type 2 diabetes mellitus without complications: Secondary | ICD-10-CM | POA: Diagnosis not present

## 2022-01-25 DIAGNOSIS — I5022 Chronic systolic (congestive) heart failure: Secondary | ICD-10-CM | POA: Diagnosis present

## 2022-01-25 DIAGNOSIS — E872 Acidosis, unspecified: Secondary | ICD-10-CM | POA: Diagnosis present

## 2022-01-25 DIAGNOSIS — T83518A Infection and inflammatory reaction due to other urinary catheter, initial encounter: Secondary | ICD-10-CM | POA: Diagnosis present

## 2022-01-25 DIAGNOSIS — N3000 Acute cystitis without hematuria: Secondary | ICD-10-CM | POA: Diagnosis present

## 2022-01-25 DIAGNOSIS — Y846 Urinary catheterization as the cause of abnormal reaction of the patient, or of later complication, without mention of misadventure at the time of the procedure: Secondary | ICD-10-CM | POA: Diagnosis present

## 2022-01-25 DIAGNOSIS — E8809 Other disorders of plasma-protein metabolism, not elsewhere classified: Secondary | ICD-10-CM | POA: Diagnosis present

## 2022-01-25 DIAGNOSIS — E860 Dehydration: Secondary | ICD-10-CM | POA: Diagnosis present

## 2022-01-25 DIAGNOSIS — Z8249 Family history of ischemic heart disease and other diseases of the circulatory system: Secondary | ICD-10-CM

## 2022-01-25 DIAGNOSIS — E1122 Type 2 diabetes mellitus with diabetic chronic kidney disease: Secondary | ICD-10-CM | POA: Diagnosis present

## 2022-01-25 DIAGNOSIS — E1142 Type 2 diabetes mellitus with diabetic polyneuropathy: Secondary | ICD-10-CM | POA: Diagnosis present

## 2022-01-25 DIAGNOSIS — E039 Hypothyroidism, unspecified: Secondary | ICD-10-CM | POA: Diagnosis present

## 2022-01-25 DIAGNOSIS — K219 Gastro-esophageal reflux disease without esophagitis: Secondary | ICD-10-CM | POA: Diagnosis present

## 2022-01-25 DIAGNOSIS — R6520 Severe sepsis without septic shock: Secondary | ICD-10-CM | POA: Diagnosis present

## 2022-01-25 DIAGNOSIS — E861 Hypovolemia: Secondary | ICD-10-CM | POA: Diagnosis present

## 2022-01-25 DIAGNOSIS — A419 Sepsis, unspecified organism: Secondary | ICD-10-CM | POA: Diagnosis present

## 2022-01-25 DIAGNOSIS — Z79899 Other long term (current) drug therapy: Secondary | ICD-10-CM

## 2022-01-25 DIAGNOSIS — G9341 Metabolic encephalopathy: Secondary | ICD-10-CM | POA: Diagnosis present

## 2022-01-25 DIAGNOSIS — Z886 Allergy status to analgesic agent status: Secondary | ICD-10-CM

## 2022-01-25 DIAGNOSIS — N319 Neuromuscular dysfunction of bladder, unspecified: Secondary | ICD-10-CM | POA: Diagnosis present

## 2022-01-25 DIAGNOSIS — Z91041 Radiographic dye allergy status: Secondary | ICD-10-CM

## 2022-01-25 DIAGNOSIS — I10 Essential (primary) hypertension: Secondary | ICD-10-CM | POA: Diagnosis not present

## 2022-01-25 DIAGNOSIS — I351 Nonrheumatic aortic (valve) insufficiency: Secondary | ICD-10-CM | POA: Diagnosis present

## 2022-01-25 DIAGNOSIS — Z7984 Long term (current) use of oral hypoglycemic drugs: Secondary | ICD-10-CM

## 2022-01-25 DIAGNOSIS — D631 Anemia in chronic kidney disease: Secondary | ICD-10-CM | POA: Diagnosis present

## 2022-01-25 DIAGNOSIS — Z882 Allergy status to sulfonamides status: Secondary | ICD-10-CM

## 2022-01-25 DIAGNOSIS — Z90710 Acquired absence of both cervix and uterus: Secondary | ICD-10-CM

## 2022-01-25 LAB — CBC WITH DIFFERENTIAL/PLATELET
Abs Immature Granulocytes: 0.02 10*3/uL (ref 0.00–0.07)
Basophils Absolute: 0 10*3/uL (ref 0.0–0.1)
Basophils Relative: 1 %
Eosinophils Absolute: 0.2 10*3/uL (ref 0.0–0.5)
Eosinophils Relative: 4 %
HCT: 38.5 % (ref 36.0–46.0)
Hemoglobin: 11.6 g/dL — ABNORMAL LOW (ref 12.0–15.0)
Immature Granulocytes: 0 %
Lymphocytes Relative: 14 %
Lymphs Abs: 0.9 10*3/uL (ref 0.7–4.0)
MCH: 28.3 pg (ref 26.0–34.0)
MCHC: 30.1 g/dL (ref 30.0–36.0)
MCV: 93.9 fL (ref 80.0–100.0)
Monocytes Absolute: 0.3 10*3/uL (ref 0.1–1.0)
Monocytes Relative: 5 %
Neutro Abs: 4.8 10*3/uL (ref 1.7–7.7)
Neutrophils Relative %: 76 %
Platelets: 422 10*3/uL — ABNORMAL HIGH (ref 150–400)
RBC: 4.1 MIL/uL (ref 3.87–5.11)
RDW: 14.6 % (ref 11.5–15.5)
WBC: 6.3 10*3/uL (ref 4.0–10.5)
nRBC: 0 % (ref 0.0–0.2)

## 2022-01-25 LAB — CBG MONITORING, ED: Glucose-Capillary: 104 mg/dL — ABNORMAL HIGH (ref 70–99)

## 2022-01-25 LAB — URINALYSIS, ROUTINE W REFLEX MICROSCOPIC
Bilirubin Urine: NEGATIVE
Glucose, UA: NEGATIVE mg/dL
Ketones, ur: NEGATIVE mg/dL
Nitrite: NEGATIVE
Protein, ur: 30 mg/dL — AB
RBC / HPF: >50 RBC/hpf — ABNORMAL HIGH (ref 0–5)
Specific Gravity, Urine: 1.01 (ref 1.005–1.030)
WBC, UA: >50 WBC/hpf — ABNORMAL HIGH (ref 0–5)
pH: 6 (ref 5.0–8.0)

## 2022-01-25 LAB — COMPREHENSIVE METABOLIC PANEL
ALT: 18 U/L (ref 0–44)
AST: 21 U/L (ref 15–41)
Albumin: 3.7 g/dL (ref 3.5–5.0)
Alkaline Phosphatase: 76 U/L (ref 38–126)
Anion gap: 16 — ABNORMAL HIGH (ref 5–15)
BUN: 49 mg/dL — ABNORMAL HIGH (ref 8–23)
CO2: 24 mmol/L (ref 22–32)
Calcium: 10.2 mg/dL (ref 8.9–10.3)
Chloride: 99 mmol/L (ref 98–111)
Creatinine, Ser: 1.65 mg/dL — ABNORMAL HIGH (ref 0.44–1.00)
GFR, Estimated: 33 mL/min — ABNORMAL LOW (ref >60)
Glucose, Bld: 106 mg/dL — ABNORMAL HIGH (ref 70–99)
Potassium: 5.6 mmol/L — ABNORMAL HIGH (ref 3.5–5.1)
Sodium: 139 mmol/L (ref 135–145)
Total Bilirubin: 0.6 mg/dL (ref 0.3–1.2)
Total Protein: 9.3 g/dL — ABNORMAL HIGH (ref 6.5–8.1)

## 2022-01-25 LAB — LACTIC ACID, PLASMA: Lactic Acid, Venous: 4.3 mmol/L (ref 0.5–1.9)

## 2022-01-25 MED ORDER — ACETAMINOPHEN 325 MG PO TABS
650.0000 mg | ORAL_TABLET | Freq: Four times a day (QID) | ORAL | Status: DC | PRN
  Administered 2022-01-27: 650 mg via ORAL
  Filled 2022-01-25: qty 2

## 2022-01-25 MED ORDER — SODIUM CHLORIDE 0.9 % IV SOLN
2.0000 g | Freq: Two times a day (BID) | INTRAVENOUS | Status: DC
  Administered 2022-01-25 – 2022-01-28 (×6): 2 g via INTRAVENOUS
  Filled 2022-01-25 (×6): qty 12.5

## 2022-01-25 MED ORDER — OXYCODONE-ACETAMINOPHEN 5-325 MG PO TABS: 2.0000 | ORAL_TABLET | Freq: Once | ORAL | Status: DC

## 2022-01-25 MED ORDER — SODIUM CHLORIDE 0.9 % IV BOLUS
1000.0000 mL | Freq: Once | INTRAVENOUS | Status: AC
  Administered 2022-01-25: 1000 mL via INTRAVENOUS

## 2022-01-25 MED ORDER — INSULIN ASPART 100 UNIT/ML IJ SOLN: 0.0000 [IU] | Freq: Three times a day (TID) | INTRAMUSCULAR | Status: DC

## 2022-01-25 MED ORDER — SODIUM CHLORIDE 0.9 % IV SOLN: 2.0000 g | Freq: Once | INTRAVENOUS | Status: DC

## 2022-01-25 MED ORDER — ACETAMINOPHEN 650 MG RE SUPP: 650.0000 mg | Freq: Four times a day (QID) | RECTAL | Status: DC | PRN

## 2022-01-25 MED ORDER — SODIUM CHLORIDE 0.9 % IV SOLN: INTRAVENOUS | Status: AC

## 2022-01-25 MED ORDER — FENTANYL CITRATE PF 50 MCG/ML IJ SOSY
50.0000 ug | PREFILLED_SYRINGE | INTRAMUSCULAR | Status: DC | PRN
  Administered 2022-01-25: 50 ug via INTRAVENOUS
  Filled 2022-01-25: qty 1

## 2022-01-25 NOTE — Progress Notes (Signed)
Pharmacy Antibiotic Note  [name] is a 71 y.o. female for which pharmacy has been consulted for cefepime dosing for UTI.  Patient with a history of myelopathy s/p cervical spine surgery resulting in paraplegia, history of sacral ulcer complicated by osteomyelitis, diabetes, neurogenic bladder s/p suprapubic catheter placement.  SCr 1.65  WBC 6.3; LA 4.3; T 94.2; HR 62; RR 18  Plan: Cefepime 2g q12hr Trend WBC, Fever, Renal function F/u cultures, clinical course, WBC, fever De-escalate when able     Temp (24hrs), Avg:94.2 F (34.6 C), Min:94.2 F (34.6 C), Max:94.2 F (34.6 C)  Recent Labs  Lab 01/25/22 1510  WBC 6.3  CREATININE 1.65*  LATICACIDVEN 4.3*    CrCl cannot be calculated (Unknown ideal weight.).    Allergies  Allergen Reactions   Ivp Dye [Iodinated Contrast Media] Other (See Comments)    "hot and sweaty and almost passed out"   Aspirin Hives   Morphine And Related Itching and Rash   Sulfa Antibiotics Hives    Antimicrobials this admission: cefepime 10/21 >>   Microbiology results: Pending  Thank you for allowing pharmacy to be a part of this patient's care.  Lorelei Pont, PharmD, BCPS 01/25/2022 9:05 PM ED Clinical Pharmacist -  [phone]

## 2022-01-25 NOTE — ED Triage Notes (Signed)
Patient here to have pressure sores on buttocks checked and possible UTI. Family member reports that she awoke this am with AMS. States that she was fine yesterday. Alert and oriented. Denies pain

## 2022-01-25 NOTE — ED Notes (Signed)
Unable to get pt temp when it timed out it was around 92.  pt is cold to the touch head, arms and nose.

## 2022-01-25 NOTE — ED Provider Notes (Addendum)
[facility] DEPART[facility] Provider Note   CSN: [id number] Arrival date & time: 01/25/22  1340     History  No chief complaint on file.   [name] is a 71 y.o. female.  HPI    71 yo woman with PMHx of cervical surgery, paraplegia, chronic ulcers and chronic suprapubic catheter due to neurogenic bladder comes in with chief complaint of altered mental status and weakness.  Patient has history of recurrent UTI.  Her suprapubic catheter was changed about a month ago and is due for change soon.  According to the husband, patient was doing well yesterday but today she has been confused and " talking out of her mind".  She is also weaker than usual, prompting her to bring her to the ER.  While in the waiting room, patient's vital signs deteriorated and she was found to be hypothermic, and brought to treatment room.  Patient complains of some burning type discomfort in the lower abdomen few minutes into her stay, before catheter was replaced by [place].  She is also complaining of back pain, but she thinks it is because of her sitting on the chair in the waiting room for long duration.  Husband states that the wounds have been healing well.  Home Medications Prior to Admission medications   Medication Sig Start Date End Date Taking? Authorizing Provider  baclofen (LIORESAL) 10 MG tablet Take 10 mg by mouth 4 (four) times daily. 11/21/21   [provider]  bisacodyl (DULCOLAX) 10 MG suppository Place 1 suppository (10 mg total) rectally daily as needed for severe constipation. 12/17/21   Gaylan Gerold, DO  doxycycline (VIBRA-TABS) 100 MG tablet Take 1 tablet (100 mg total) by mouth 2 (two) times daily. 01/24/22   Trula Slade, DPM  DULoxetine (CYMBALTA) 60 MG capsule Take 60 mg by mouth daily. 11/21/21   [provider]  folic acid (FOLVITE) 1 MG tablet Take 1 mg by mouth daily.     [provider]  furosemide (LASIX) 40 MG tablet  Take 20 mg by mouth daily. 09/26/21   [provider]  levothyroxine (SYNTHROID, LEVOTHROID) 125 MCG tablet Take 1 tablet (125 mcg total) by mouth daily before breakfast. Patient taking differently: Take 125 mcg by mouth daily. 11/07/14   Francesca Oman, DO  metFORMIN (GLUCOPHAGE) 1000 MG tablet Take 1,000 mg by mouth 2 (two) times daily. 02/01/19   [provider]  metoprolol (LOPRESSOR) 100 MG tablet Take 100 mg by mouth daily. 10/28/14   [provider]  NIFEdipine (ADALAT CC) 30 MG 24 hr tablet Take 30 mg by mouth daily. 11/25/21   [provider]  PERCOCET 7.5-325 MG tablet Take 1 tablet by mouth 2 (two) times daily. 11/21/21   [provider]  pregabalin (LYRICA) 100 MG capsule Take 100 mg by mouth 2 (two) times daily. 12/09/21   [provider]  Zinc Sulfate 220 (50 Zn) MG TABS Take 50 mg by mouth daily. 11/07/14   [provider]      Allergies    Ivp dye [iodinated contrast media], Aspirin, Morphine and related, and Sulfa antibiotics    Review of Systems   Review of Systems  All other systems reviewed and are negative.   Physical Exam Updated Vital Signs BP (!) 120/52   Pulse 63   Temp (!) 94.2 F (34.6 C) (Rectal)   Resp 16   Wt 113.4 kg   SpO2 100%   BMI 44.29 kg/m  Physical Exam Vitals and nursing note reviewed.  Constitutional:      Appearance: She is well-developed.  HENT:     Head: Atraumatic.  Cardiovascular:     Rate and Rhythm: Normal rate.  Pulmonary:     Effort: Pulmonary effort is normal.  Abdominal:     Tenderness: There is abdominal tenderness.     Comments: Mild lower quadrant abdominal tenderness in the suprapubic region  Genitourinary:    Comments: Suprapubic catheter in place. Musculoskeletal:     Cervical back: Normal range of motion and neck supple.  Skin:    General: Skin is warm and dry.  Neurological:     Mental Status: She is alert and oriented to person, place, and time.     ED  Results / Procedures / Treatments   Labs (all labs ordered are listed, but only abnormal results are displayed) Labs Reviewed  COMPREHENSIVE METABOLIC PANEL - Abnormal; Notable for the following components:      Result Value   Potassium 5.6 (*)    Glucose, Bld 106 (*)    BUN 49 (*)    Creatinine, Ser 1.65 (*)    Total Protein 9.3 (*)    GFR, Estimated 33 (*)    Anion gap 16 (*)    All other components within normal limits  CBC WITH DIFFERENTIAL/PLATELET - Abnormal; Notable for the following components:   Hemoglobin 11.6 (*)    Platelets 422 (*)    All other components within normal limits  LACTIC ACID, PLASMA - Abnormal; Notable for the following components:   Lactic Acid, Venous 4.3 (*)    All other components within normal limits  CBG MONITORING, ED - Abnormal; Notable for the following components:   Glucose-Capillary 104 (*)    All other components within normal limits  CULTURE, BLOOD (ROUTINE X 2)  CULTURE, BLOOD (ROUTINE X 2)  URINE CULTURE  URINALYSIS, ROUTINE W REFLEX MICROSCOPIC  CBC WITH DIFFERENTIAL/PLATELET  COMPREHENSIVE METABOLIC PANEL  MAGNESIUM  MAGNESIUM  LACTIC ACID, PLASMA  BASIC METABOLIC PANEL  LACTIC ACID, PLASMA  LACTIC ACID, PLASMA  AMMONIA  PROTIME-INR  CK  TSH  BLOOD GAS, VENOUS  RAPID URINE DRUG SCREEN, HOSP PERFORMED  PROCALCITONIN  ETHANOL  LIPASE, BLOOD  PHOSPHORUS    EKG EKG Interpretation  Date/Time:  Saturday January 25 2022 14:55:23 EDT Ventricular Rate:  67 PR Interval:  162 QRS Duration: 88 QT Interval:  420 QTC Calculation: 443 R Axis:   -2 Text Interpretation: Normal sinus rhythm Moderate voltage criteria for LVH, may be normal variant ( R in aVL , Cornell product ) Possible Inferior infarct , age undetermined Abnormal ECG When compared with ECG of 14-Dec-2021 13:09, PREVIOUS ECG IS PRESENT No acute changes Confirmed by Varney Biles [phone]) on 01/25/2022 7:02:12 PM  Radiology CT HEAD WO CONTRAST  Result Date:  01/25/2022 CLINICAL DATA:  Mental status change EXAM: CT HEAD WITHOUT CONTRAST TECHNIQUE: Contiguous axial images were obtained from the base of the skull through the vertex without intravenous contrast. RADIATION DOSE REDUCTION: This exam was performed according to the departmental dose-optimization program which includes automated exposure control, adjustment of the mA and/or kV according to patient size and/or use of iterative reconstruction technique. COMPARISON:  12/26/2020 FINDINGS: Brain: No evidence of acute infarction, hemorrhage, hydrocephalus, extra-axial collection or mass lesion/mass effect. Vascular: No hyperdense vessel or unexpected calcification. Skull: Normal. Negative for fracture or focal lesion. Sinuses/Orbits: No acute finding. Other: Cervical spine fixation hardware partially visualized. IMPRESSION: No acute intracranial abnormality.  Electronically Signed   By: Lucrezia Europe M.D.   On: 01/25/2022 16:56   DG Chest 1 View  Result Date: 01/25/2022 CLINICAL DATA:  Cough.  Chest pain. EXAM: CHEST  1 VIEW COMPARISON:  Two-view chest x-ray 12/14/2021 FINDINGS: Heart size is exaggerated by low lung volumes. No edema or effusion is present. No focal airspace disease is present. IMPRESSION: 1. Low lung volumes. 2. No acute cardiopulmonary disease. Electronically Signed   By: San Morelle M.D.   On: 01/25/2022 16:15    Procedures .Critical Care  Performed by: Varney Biles, MD Authorized by: Varney Biles, MD   Critical care provider statement:    Critical care time (minutes):  36   Critical care was necessary to treat or prevent imminent or life-threatening deterioration of the following conditions:  CNS failure or compromise and sepsis   Critical care was time spent personally by me on the following activities:  Development of treatment plan with patient or surrogate, discussions with consultants, evaluation of patient's response to treatment, examination of patient, ordering  and review of laboratory studies, ordering and review of radiographic studies, ordering and performing treatments and interventions, pulse oximetry, re-evaluation of patient's condition and review of old charts SUPRAPUBIC TUBE PLACEMENT  Date/Time: 01/25/2022 9:55 PM  Performed by: Varney Biles, MD Authorized by: Varney Biles, MD   Consent:    Consent obtained:  Verbal   Consent given by:  Patient   Risks, benefits, and alternatives were discussed: yes     Risks discussed:  Bleeding, bowel perforation, infection and pain Universal protocol:    Procedure explained and questions answered to patient or proxy's satisfaction: yes     Test results available: yes     Imaging studies available: yes     Immediately prior to procedure, a time out was called: yes     Patient identity confirmed:  Arm band Sedation:    Sedation type:  None Anesthesia:    Anesthesia method:  None Procedure details:    Complexity:  Simple   Catheter type:  Foley   Catheter size:  18 Fr   Ultrasound guidance: no     Number of attempts:  1   Urine characteristics:  Clear Post-procedure details:    Procedure completion:  Tolerated Comments:     Patient tolerated the procedure well, however few minutes after the procedure she started indicating that the burning sensation she was complaining about prior to the placement of the Foley catheter has worsened.  CT abdomen pelvis without contrast was added. SUPRAPUBIC TUBE PLACEMENT  Date/Time: 01/25/2022 10:50 PM  Performed by: Varney Biles, MD Authorized by: Varney Biles, MD   Consent:    Consent obtained:  Verbal   Consent given by:  Patient   Risks, benefits, and alternatives were discussed: yes     Risks discussed:  Bleeding, bowel perforation, infection and pain   Alternatives discussed:  No treatment Universal protocol:    Procedure explained and questions answered to patient or proxy's satisfaction: yes     Immediately prior to procedure, a  time out was called: yes     Patient identity confirmed:  Arm band Sedation:    Sedation type:  None Anesthesia:    Anesthesia method:  None Procedure details:    Complexity:  Complex   Catheter type:  Foley   Catheter size:  16 Fr   Ultrasound guidance: no     Number of attempts:  1   Urine characteristics:  Clear Post-procedure details:  Procedure completion:  Tolerated well, no immediate complications Comments:     Repeat attempt of 58 [place] was unsuccessful.  16 [place] slighted and without any problem.     Medications Ordered in ED Medications  0.9 %  sodium chloride infusion ( Intravenous New Bag/Given 01/25/22 2249)  ceFEPIme (MAXIPIME) 2 g in sodium chloride 0.9 % 100 mL IVPB (0 g Intravenous Stopped 01/25/22 2155)  fentaNYL (SUBLIMAZE) injection 50 mcg (50 mcg Intravenous Given 01/25/22 2141)  acetaminophen (TYLENOL) tablet 650 mg (has no administration in time range)    Or  acetaminophen (TYLENOL) suppository 650 mg (has no administration in time range)  insulin aspart (novoLOG) injection 0-6 Units (has no administration in time range)  sodium chloride 0.9 % bolus 1,000 mL (0 mLs Intravenous Stopped 01/25/22 2249)    ED Course/ Medical Decision Making/ A&P Clinical Course as of 01/25/22 2251  Sat Jan 25, 2022  2250 Patient could not tolerate the suprapubic catheter that was placed. I removed the suprapubic catheter. I was unable to place 18 [place] Foley catheter there after. 63 [place] Foley catheter went in without any problem.  Urine drainage noted.  Dr. Jeffie Pollock, urology will also round on the patient as per my request. [AN]    Clinical Course User Index [AN] Varney Biles, MD                           Medical Decision Making 71 year old female with history of neurogenic bladder, suprapubic catheter, chronic osteomyelitis and pressure ulcers comes in with chief complaint of altered mental status and weakness.  During my assessment she appears quite alert.   She is however having pain in her back, with wound site appearing clean.  She is also reporting some suprapubic discomfort and burning type discomfort in the suprapubic region.  Abdominal exam however is reassuring.  Patient was found to be hypothermic in triage on reassessment and placed in the room.  Initial thought is that patient likely is having UTI.  Given that in the past she has grown Pseudomonas and there is always been concerns of colonization, we decided to replace her suprapubic catheter in the ER and get a clean urine sample for testing purposes.  Her lactic acid is elevated over 4. Clinically patient is not in septic shock.  She is not tachycardic, there is no hypotension and her mental status overall has improved.  He does appear that she is in severe sepsis.  2 L of IV fluid was ordered. When patient's lactic acid was noted to be elevated, she had repeat hemodynamic reassessment that is reassuring.  We will not order additional fluid at this point.  IV cefepime has been ordered. Patient is stable for admission.  Amount and/or Complexity of Data Reviewed Labs: ordered. Radiology: ordered.  Risk Prescription drug management. Decision regarding hospitalization.    Final Clinical Impression(s) / ED Diagnoses Final diagnoses:  Acute cystitis without hematuria  Hypothermia, initial encounter  Lactic acidosis    Rx / DC Orders ED Discharge Orders     None         Varney Biles, MD 01/25/22 2159    Varney Biles, MD 01/25/22 2251

## 2022-01-25 NOTE — ED Notes (Signed)
Patient resting talking with husband at bedside. Patient able to follow all verbal command without any issues. No s/s of any distress., No verbal c/o pain or discomfort. Will continue to monitor

## 2022-01-25 NOTE — Sepsis Progress Note (Signed)
Elink following code sepsis °

## 2022-01-25 NOTE — ED Provider Triage Note (Signed)
Emergency Medicine Provider Triage Evaluation Note  [name] , a 71 y.o. female  was evaluated in triage.  Pt complains of altered level of consciousness.  Patient is accompanied by husband who is primary story.  Husband states that patient developed altered mental status around 9 AM when he went to change her position after she was awake.  Patient is suprapubic catheter placement and has been notes similar symptoms of altered level consciousness secondary to urinary tract infection in the past.  Patient has known sacral ulcers of which are causing her pain during exam.  Denies fever, chills, night sweats, cough, congestion, chest pain, shortness of breath, nausea, vomiting, vaginal symptoms, change in bowel habit  Review of Systems  Positive: See above Negative:   Physical Exam  BP (!) 124/54   Pulse 67   Resp 18   SpO2 100%  Gen:   Awake, no distress   Resp:  Normal effort  MSK:   Moves upper extremities without difficulty  Other:    Medical Decision Making  Medically screening exam initiated at 3:10 PM.  Appropriate orders placed.  [name] was informed that the remainder of the evaluation will be completed by another provider, this initial triage assessment does not replace that evaluation, and the importance of remaining in the ED until their evaluation is complete.     Wilnette Kales, [place] 01/25/22 1512

## 2022-01-25 NOTE — ED Notes (Signed)
CRITICAL VALUE STICKER  CRITICAL VALUE:LA 4.3  RECEIVER (on-site recipient of call):[place] NOTIFIED: 2035  MESSENGER (representative from lab):  MD NOTIFIED: NANAVATI 2035  TIME OF NOTIFICATION:2035  RESPONSE:

## 2022-01-25 NOTE — H&P (Signed)
History and Physical    PLEASE NOTE THAT DRAGON DI[employer] SOFTWARE WAS USED IN THE CONSTRUCTION OF THIS NOTE.   [name] MXV:[id number] DOB: 1950-10-28 DOA: 01/25/2022  PCP: Salli Real, MD  Patient coming from: home   I have personally briefly reviewed patient's old medical records in [facility] Link  Chief Complaint: altered mental status  HPI: [name] is a 71 y.o. female with medical history significant for paraplegia, completed by neurogenic bladder status post chronic suprapubic Foley catheter, chronic sacral decubitus ulcer associated chronic osteomyelitis, chronic systolic heart failure, mild to moderate aortic regurgitation, due to diabetes mellitus, acquired hypothyroidism, essential hypertension, CKD stage IIIb associated baseline creatinine range 1.5-1.8, anemia of chronic kidney disease associated baseline hemoglobin ranging 8-9.5, who is admitted to [facility] on 01/25/2022 with suspected acute metabolic encephalopathy after presenting from home to [facility] ED for evaluation of altered mental status.  In the setting of the patient's altered mental status, the following history is provided by the patient's husband, in addition to my discussions with the TPN via chart review.  Husband conveys that relative to the patient's baseline mental status, she has been exhibiting evidence of progressive confusion over the course the last 2 days, associated with some visual hallucinations, which are also a department from baseline for the patient.  The patient reports that the symptoms are very similar to those experienced by the patient at times of previous urinary tract infections, and in recognizing this constellation of symptoms, the patient was brought to [facility] emergency department for further evaluation and management of presenting altered mental status.  She was reportedly admitted in September 2023 for similar.  She also has a history of chronic sacral decubitus  ulcer, cyst with chronic osteomyelitis, with has been reporting that her associated wound has been improving in the interval since the most recent hospitalization.  In the context of paraplegia, he had been neurogenic bladder with chronic suprapubic Foley catheter, management confirms a the patient's history of recurrent urinary tract infections.  Patient is complaining of some lower abdominal discomfort, but is otherwise without acute complaint at this time.  Medical history notable for stage IIIb CKD this is a baseline creatinine range 1.5-1.8, most recent prior serum creatinine data point noted to be 1.48 on 12/17/2021.  She also has a documented history of anemia of chronic kidney disease associated baseline hemoglobin range 8-9.5, with most recent prior hemoglobin noted to be 8.1 on 12/17/2021.  Not on any blood thinners as an outpatient.  Most recent echocardiogram was performed in September 2022, was notable for LVEF 45 to 50%, mild concentric LVH, indeterminate diastolic parameters, normal right ventricular systolic function, trivial mitral regurgitation, moderately dilated left atrium, and mild to moderate aortic regurgitation.     ED Course:  Vital signs in the ED were notable for the following: Temperature 94.2 F (34.6 C);; heart rate 56-68; systolic blood pressures in the low 100s to 120s; respiratory rate 16-18; oxygen saturation 99 to 100% on room air.  Labs were notable for the following: CMP was notable for the following: Potassium 5.6, prn 24, anion gap 16, creatinine 1.65, BUN creat ratio 29.7, calcium, adjusted for mild hypoalbuminemia, noted to be 10.4, albumin 3.7, otherwise, liver enzymes within normal limits.  Initial lactate 4.3.  CBC notable for white blood cell count 6300 with 76% neutrophils, hemoglobin 11.6 associated normocytic/recurrent findings as well as nonelevated RDW.  Urinalysis ordered, with result currently pending.  Blood cultures x2/urine Ultraproct prior to  initiation of IV antibiotics.  Imaging and additional notable ED work-up: EKG shows sinus rhythm with heart rate 67, normal intervals, no evidence of T wave or ST changes, including no evidence of ST elevation.  Chest x-ray shows low lung volumes, but in the absence of any acute cardiopulmonary process, including no evidence of infiltrate.  CT head shows no evidence of acute intracranial process.  For further evaluation of the patient's hypothermia in the setting of complaint of lower abdominal discomfort, CT abdomen/pelvis without contrast has been ordered, with result currently pending.  While in the ED, the following were administered: Cefepime, normal saline x1 L bolus followed by continuous NS at 150 cc/h, fentanyl 50 mcg IV x1 dose.  Subsequently, the patient was admitted for further evaluation and management of suspected acute metabolic encephalopathy with presenting evidence of dehydration as well as presenting laboratory abnormalities that include hyperkalemia, lactic acidosis, hypercalcemia, will also noting presenting hypothermia.     Review of Systems: As per HPI otherwise 10 point review of systems negative.   Past Medical History:  Diagnosis Date   Anemia    Arthritis    "all over" (04/13/2012)   Cancer of right breast ([place]) 2011   Cervical neuropathy    PERIPHERAL NEUROPATHY , HAD CERVICAL FUSION   Diabetes mellitus without complication ([place])    Difficult intubation    Fastrach #4 LMA then # 7 ETT used in 2006 [facility], cervical laminectomy)   GERD (gastroesophageal reflux disease)    Group B streptococcal infection 11/08/2020   History of kidney stones    Hypercholesteremia    Hypertension    Hypothyroidism    Kidney stones    Neurogenic bladder    Osteomyelitis (HCC)    Paraplegia (HCC)    Peripheral neuropathy    PONV (postoperative nausea and vomiting)    Renal insufficiency 12/11/2020   Sacral decubitus ulcer 10/2014   Sepsis due to urinary tract infection [facility])      Past Surgical History:  Procedure Laterality Date   ABDOMINAL HYSTERECTOMY  ~ [place] Right 2011   BREAST LUMPECTOMY Right 2011   CALDWELL LUC  ~ 2003   "benign tumor removed from up under gum" (04/13/2012)   DACROCYSTORHINOSTOMY  ~ 2000   "put stents in my tear ducts; both eyes" (04/13/2012)   EYE SURGERY  2004   STENTS TO BIL EYES   I & D EXTREMITY Bilateral 11/03/2014   Procedure: IRRIGATION AND DEBRIDEMENT BILATERAL HEEL;  Surgeon: Irene Limbo, MD;  Location: [place];  Service: Plastics;  Laterality: Bilateral;   INCISION AND DRAINAGE OF WOUND Left 11/03/2014   Procedure: IRRIGATION AND DEBRIDEMENT SACRAL ULCER;  Surgeon: Irene Limbo, MD;  Location: [place];  Service: Plastics;  Laterality: Left;   JOINT REPLACEMENT     POSTERIOR CERVICAL LAMINECTOMY  2006   SKIN SPLIT GRAFT Left 08/05/2016   Procedure: SURGICAL PREP  FOR GRAFTING APPLICATION ACELL TO LEFT ISCHIUM;  Surgeon: Irene Limbo, MD;  Location: [place];  Service: Plastics;  Laterality: Left;   TONSILLECTOMY AND ADENOIDECTOMY  ~ 1954   TOTAL KNEE ARTHROPLASTY WITH REVISION COMPONENTS  04/13/2012   Procedure: TOTAL KNEE ARTHROPLASTY WITH REVISION COMPONENTS;  Surgeon: Hessie Dibble, MD;  Location: [place];  Service: Orthopedics;  Laterality: Left;    Social History:  reports that she quit smoking about 7 years ago. Her smoking use included cigarettes. She has a 25.00 pack-year smoking history. She has never  used smokeless tobacco. She reports that she does not drink alcohol and does not use drugs.   Allergies  Allergen Reactions   Ivp Dye [Iodinated Contrast Media] Other (See Comments)    "hot and sweaty and almost passed out"   Aspirin Hives   Morphine And Related Itching and Rash   Sulfa Antibiotics Hives    Family History  Problem Relation Age of Onset   Heart disease Father     Family history reviewed and not pertinent    Prior to Admission medications   Medication Sig  Start Date End Date Taking? Authorizing Provider  baclofen (LIORESAL) 10 MG tablet Take 10 mg by mouth 4 (four) times daily. 11/21/21   [provider]  bisacodyl (DULCOLAX) 10 MG suppository Place 1 suppository (10 mg total) rectally daily as needed for severe constipation. 12/17/21   Doran Stabler, DO  doxycycline (VIBRA-TABS) 100 MG tablet Take 1 tablet (100 mg total) by mouth 2 (two) times daily. 01/24/22   Vivi Barrack, DPM  DULoxetine (CYMBALTA) 60 MG capsule Take 60 mg by mouth daily. 11/21/21   [provider]  folic acid (FOLVITE) 1 MG tablet Take 1 mg by mouth daily.     [provider]  furosemide (LASIX) 40 MG tablet Take 20 mg by mouth daily. 09/26/21   [provider]  levothyroxine (SYNTHROID, LEVOTHROID) 125 MCG tablet Take 1 tablet (125 mcg total) by mouth daily before breakfast. Patient taking differently: Take 125 mcg by mouth daily. 11/07/14   Yolanda Manges, DO  metFORMIN (GLUCOPHAGE) 1000 MG tablet Take 1,000 mg by mouth 2 (two) times daily. 02/01/19   [provider]  metoprolol (LOPRESSOR) 100 MG tablet Take 100 mg by mouth daily. 10/28/14   [provider]  NIFEdipine (ADALAT CC) 30 MG 24 hr tablet Take 30 mg by mouth daily. 11/25/21   [provider]  PERCOCET 7.5-325 MG tablet Take 1 tablet by mouth 2 (two) times daily. 11/21/21   [provider]  pregabalin (LYRICA) 100 MG capsule Take 100 mg by mouth 2 (two) times daily. 12/09/21   [provider]  Zinc Sulfate 220 (50 Zn) MG TABS Take 50 mg by mouth daily. 11/07/14   [provider]     Objective    Physical Exam: Vitals:   01/25/22 1857 01/25/22 1930 01/25/22 1935 01/25/22 2105  BP:  (!) 102/56 (!) 109/54   Pulse:  (!) 59 62   Resp:  13 16   Temp: (!) 94.2 F (34.6 C)     TempSrc: Rectal     SpO2:  100% 99%   Weight:    113.4 kg    General: appears to be stated age; alert, confused Skin: warm, dry, no rash Head:   AT/[place] Mouth:  Oral mucosa membranes appear dry, normal dentition Neck: supple; trachea midline Heart:  RRR; did not appreciate any M/R/G Lungs: CTAB, did not appreciate any wheezes, rales, or rhonchi Abdomen: + BS; soft, ND, suprapubic Foley catheter noted; mild tenderness palpation over the bilateral lower abdominal quadrants, in the absence of any associated guarding, rigidity, or rebound tenderness. Vascular: 2+ pedal pulses b/l; 2+ radial pulses b/l Extremities: no peripheral edema     Labs on Admission: I have personally reviewed following labs and imaging studies  CBC: Recent Labs  Lab 01/25/22 1510  WBC 6.3  NEUTROABS 4.8  HGB 11.6*  HCT 38.5  MCV 93.9  PLT 422*   Basic Metabolic Panel: Recent Labs  Lab 01/25/22 1510  NA 139  K 5.6*  CL 99  CO2 24  GLUCOSE 106*  BUN 49*  CREATININE 1.65*  CALCIUM 10.2   GFR: Estimated Creatinine Clearance: 36.8 mL/min (A) (by C-G formula based on SCr of 1.65 mg/dL (H)). Liver Function Tests: Recent Labs  Lab 01/25/22 1510  AST 21  ALT 18  ALKPHOS 76  BILITOT 0.6  PROT 9.3*  ALBUMIN 3.7   No results for input(s): "LIPASE", "AMYLASE" in the last 168 hours. No results for input(s): "AMMONIA" in the last 168 hours. Coagulation Profile: No results for input(s): "INR", "PROTIME" in the last 168 hours. Cardiac Enzymes: No results for input(s): "CKTOTAL", "CKMB", "CKMBINDEX", "TROPONINI" in the last 168 hours. BNP (last 3 results) No results for input(s): "PROBNP" in the last 8760 hours. HbA1C: No results for input(s): "HGBA1C" in the last 72 hours. CBG: Recent Labs  Lab 01/25/22 1934  GLUCAP 104*   Lipid Profile: No results for input(s): "CHOL", "HDL", "LDLCALC", "TRIG", "CHOLHDL", "LDLDIRECT" in the last 72 hours. Thyroid Function Tests: No results for input(s): "TSH", "T4TOTAL", "FREET4", "T3FREE", "THYROIDAB" in the last 72 hours. Anemia Panel: No results for input(s): "VITAMINB12", "FOLATE", "FERRITIN",  "TIBC", "IRON", "RETICCTPCT" in the last 72 hours. Urine analysis:    Component Value Date/Time   COLORURINE YELLOW 12/14/2021 1646   APPEARANCEUR HAZY (A) 12/14/2021 1646   LABSPEC 1.012 12/14/2021 1646   PHURINE 6.0 12/14/2021 1646   GLUCOSEU NEGATIVE 12/14/2021 1646   HGBUR MODERATE (A) 12/14/2021 1646   BILIRUBINUR NEGATIVE 12/14/2021 1646   KETONESUR NEGATIVE 12/14/2021 1646   PROTEINUR 100 (A) 12/14/2021 1646   UROBILINOGEN 1.0 11/01/2014 1127   NITRITE NEGATIVE 12/14/2021 1646   LEUKOCYTESUR LARGE (A) 12/14/2021 1646    Radiological Exams on Admission: CT HEAD WO CONTRAST  Result Date: 01/25/2022 CLINICAL DATA:  Mental status change EXAM: CT HEAD WITHOUT CONTRAST TECHNIQUE: Contiguous axial images were obtained from the base of the skull through the vertex without intravenous contrast. RADIATION DOSE REDUCTION: This exam was performed according to the departmental dose-optimization program which includes automated exposure control, adjustment of the mA and/or kV according to patient size and/or use of iterative reconstruction technique. COMPARISON:  12/26/2020 FINDINGS: Brain: No evidence of acute infarction, hemorrhage, hydrocephalus, extra-axial collection or mass lesion/mass effect. Vascular: No hyperdense vessel or unexpected calcification. Skull: Normal. Negative for fracture or focal lesion. Sinuses/Orbits: No acute finding. Other: Cervical spine fixation hardware partially visualized. IMPRESSION: No acute intracranial abnormality. Electronically Signed   By: Lucrezia Europe M.D.   On: 01/25/2022 16:56   DG Chest 1 View  Result Date: 01/25/2022 CLINICAL DATA:  Cough.  Chest pain. EXAM: CHEST  1 VIEW COMPARISON:  Two-view chest x-ray 12/14/2021 FINDINGS: Heart size is exaggerated by low lung volumes. No edema or effusion is present. No focal airspace disease is present. IMPRESSION: 1. Low lung volumes. 2. No acute cardiopulmonary disease. Electronically Signed   By: San Morelle M.D.   On: 01/25/2022 16:15     EKG: Independently reviewed, with result as described above.    Assessment/Plan   Principal Problem:   Acute metabolic encephalopathy Active Problems:   Hypothyroidism   Hypertension   DM2 (diabetes mellitus, type 2) (HCC)   Hyperkalemia   Lactic acidosis   Hypothermia   Hypercalcemia   Dehydration   Stage 3b chronic kidney disease (CKD) (HCC)   Anemia of chronic kidney failure   Chronic systolic CHF (congestive heart failure) ([place])        #)  Acute metabolic encephalopathy: 2 days of progressive confusion, visual sedations, relative to baseline mental status, reportedly similar to symptoms experienced at times of previous urinary tract infections.  Suspect that this is metabolic in nature, including possibility of urinary tract infection given the above history and increased risk for ensuing development of UTI given presence of neurogenic bladder and suprapubic Foley catheter.  However, urinalysis result currently pending.  No overt evidence of additional acute infection, including chest x-ray which showed no evidence of acute process.  Additionally, chronic sacral decubitus ulcer associate with chronic osteomyelitis, with apparent evidence of interval improvement relative to 1 month ago.  Of note, CT abdomen/pelvis are currently pending in the setting of new onset lower abdominal discomfort.  Beyond infectious possibilities, differential for patient's suspected metabolic encephalopathy includes clinical evidence of dehydration resulting in hypercalcemia, as further detailed below.  She also appears to have a metabolic acidosis, likely posing additional contribution towards her acute metabolic encephalopathy.  There may also be atoxic given outpatient medication regimen which can assist of several central acting medications, including scheduled baclofen, scheduled Percocet, in addition to Cymbalta, Lyrica.   Acute CVA appears less likely at  this time relative to the above, also noting presenting CTh is no evidence of acute intracranial process, as above.   Plan: fall precautions. Repeat CMP/CBC in the AM. Check magnesium level. check VBG, TSH.  nursing bedside swallow evaluation prior to the initiation of a diet/oral medications.  Follow-up result urinalysis, CT abdomen/pelvis, procalcitonin level.  For now, we will continue empiric cefepime initiated in the ED, with the possibility of discontinuing it considering work-up is not indicative of underlying infectious process.  Hold home central acting medications for now, including home scheduled Percocet, scheduled baclofen, Cymbalta, Lyrica.  Check ammonia, CPK, INR, urinary drug screen.  Further evaluation management of presenting dehydration/hypercalcemia, as further detailed below, including plan for additional IV fluids.         #) Hypothermia: Low presenting temperature, as 1 about above, which does not meet quantitative threshold for SIRS criteria.  Additionally, no other SIRS criteria met.  Therefore, criteria for sepsis not currently met at this time, although there is a level of suspicion for underlying urinary tract infection given her presentation that involves altered mental status, as above.  Plan: Blanket warmer ordered.  Check TSH.  Follow-up result urinalysis, CT abdomen/pelvis, as above.             #) Lactic Acidosis: Presenting lactate noted to be elevated at 4.3.  Lower suspicion for potential underlying infectious process, criteria are not currently met for sepsis, as further detailed above.  Additional potential contributing factors include clinical evidence to suggest dehydration, as further detailed below as well as potential for type B lactic acidosis with a consequence of metformin in the setting of serum creatinine of 1.65.  Given paraplegic status, will also add on CPK level to evaluate for any element of rhabdomyolysis.  Will noting history of  underlying diabetes, presentation does not appear to be consistent with DKA.  Given recent decline in oral intake over the last few days in the setting of presenting altered mental status, differential also includes the possibility of starvation keto lactic acidosis.   Plan: CMP/CBC in the AM. Monitor strict I's & O's. Check INR.  Continuous NS, with repeat lactate ordered for 1 AM on 01/26/2022.  Further evaluation management of dehydration, as further detailed below.  Holding metformin for now.  Check CPK level.  Follow-up result urinalysis, including  evaluating for the presence of ketones.  Add on serum ethanol level.  UDS.            #) Hyperkalemia: Mildly elevated initial serum potassium level of 5.6, with suspected contributions from dehydration as well as extracellular shift in the setting of evidence of metabolic acidosis, as further detailed above.  We will provide interval IV fluids, with repeat BMP to be checked at 1 AM, with plan for escalation of medical management if no and seeing improvement in serum potassium level with these rehydration efforts.  No associated any evidence of peaked T waves on EKG.  Plan: Continuous IV fluids, as above.  Monitor strict I's and O's and daily weights.  BMP at 1 AM on 01/26/2022, as above.  Further evaluation management of presenting lactic acidosis, as above.  Repeat CMP in the morning.  Add on serum magnesium level.                #) Dehydration: Clinical suspicion for such, including the appearance of dry oral mucous membranes as well as laboratory findings notable for acute prerenal azotemia, as well as evidence of hypercalcemia and interval increase in hemoglobin relative to baseline level of anemia, suggestive of an element of hemoconcentration as consequence of dehydration.  Appears to be in the setting of   recent decline in oral intake as consequence of presenting altered mental status.  No e/o associated hypotension.  We will  pursue IV fluid resuscitation, but with care to not induce volume overload given a history of chronic systolic heart failure as well as mild to moderate aortic regurgitation.   Plan: Monitor strict I's and O's.  Daily weights.  Repeat CMP in the morning.  Continuous IV fluids, as above.             #) Hypercalcemia: Presenting labs reflect adjusted serum calcium of 10.4.  Suspect element of dehydration, with potential pharmacologic contribution from home Cymbalta. Will initiate gentle IVF's, as above, with repeat calcium level in the morning, with consideration for further expansion of work-up if no ensuing improvement in serum calcium level following interval IVF administration.     Plan: In this, as above.  Monitor strict I's&O's, daily weights.  CMP in the morning.   Check serum Mg and Phos levels.  Hold home Cymbalta for now.             #) Anemia of chronic kidney disease: Documented history of such, a/w with baseline hgb range of 8-9.5, with presenting hgb slightly higher than this range, suggestive of an element of hemoconcentration as consequence of presenting dehydration .  No evidence of active bleed at this time.  Not any blood thinners at home.  Plan: Repeat CBC in the morning.  Monitor strict I's and O's and daily weights.  Check INR.          #) CKD Stage 3B: Documented history of such, with baseline creatinine in the range of 1.51.8, with presenting creatinine consistent with this baseline.    Plan: Monitor strict I's and O's and daily weights.  Attempt to avoid nephrotoxic agents.  CMP/magnesium level in the AM.                 #) Type 2 Diabetes Mellitus: documented history of such. Home insulin regimen: None. Home oral hypoglycemic agents: Metformin. presenting blood sugar: 106. Most recent A1c noted to be 5.4% when checked in September 2023.  Plan: accuchecks QAC and HS with low dose SSI. hold  home metformin during this hospitalization,  will also pursuing further work-up for presenting lactic acidosis.           #) acquired hypothyroidism: documented h/o such, on Synthroid as outpatient.   Plan: cont home Synthroid.  Check TSH, particular in setting of presenting acute encephalopathy as well as hypothermia.           #) Essential Hypertension: documented h/o such, with outpatient antihypertensive regimen including nifedipine and Lopressor.  SBP's in the ED today: In the low 100s to 120s mmHg. in the context of his presenting normotensive to borderline low blood pressures and suspicion for underlying infection, will hold him antihypertensive medications for now.  Plan: Close monitoring of subsequent BP via routine VS. hold home nifedipine and Lopressor for now, as above.           #) Chronic systolic heart failure: documented history of such, with most recent echocardiogram performed in September 2022 demonstrating evidence of LVEF 45 to 50%, indeterminate diastolic parameters, mild to moderate aortic regurgitation, and additional details as conveyed above. No clinical or evidence to suggest acutely decompensated heart failure at this time but rather, clinically, presentation appears suggestive of mild dehydration. home diuretic regimen reportedly consists of the following: Daily Lasix.  In the context of worsening dehydration, will provide gentle IV fluids, closely monitor for ensuing evidence to suggest acute volume overload, as further detailed below.  Plan: monitor strict I's & O's and daily weights. Repeat CMP in AM. Check serum mag level.  Hold home diuretic regimen.        DVT prophylaxis: SCD's   Code Status: Full code Family Communication: Case discussed with the patient's history, as further detailed above Disposition Plan: Per Rounding Team Consults called: none;  Admission status: Inpatient    PLEASE NOTE THAT DRAGON DICTATION SOFTWARE WAS USED IN THE CONSTRUCTION OF THIS  NOTE.   [place] DO [facility]  From [place]   01/25/2022, 9:44 PM

## 2022-01-26 ENCOUNTER — Inpatient Hospital Stay (HOSPITAL_COMMUNITY): Payer: Medicare Other

## 2022-01-26 DIAGNOSIS — G9341 Metabolic encephalopathy: Secondary | ICD-10-CM | POA: Diagnosis not present

## 2022-01-26 LAB — I-STAT VENOUS BLOOD GAS, ED
Acid-base deficit: 1 mmol/L (ref 0.0–2.0)
Bicarbonate: 24 mmol/L (ref 20.0–28.0)
Calcium, Ion: 1.19 mmol/L (ref 1.15–1.40)
HCT: 30 % — ABNORMAL LOW (ref 36.0–46.0)
Hemoglobin: 10.2 g/dL — ABNORMAL LOW (ref 12.0–15.0)
O2 Saturation: 86 %
Potassium: 4.8 mmol/L (ref 3.5–5.1)
Sodium: 142 mmol/L (ref 135–145)
TCO2: 25 mmol/L (ref 22–32)
pCO2, Ven: 39 mmHg — ABNORMAL LOW (ref 44–60)
pH, Ven: 7.397 (ref 7.25–7.43)
pO2, Ven: 51 mmHg — ABNORMAL HIGH (ref 32–45)

## 2022-01-26 LAB — CBC WITH DIFFERENTIAL/PLATELET
Abs Immature Granulocytes: 0.02 10*3/uL (ref 0.00–0.07)
Basophils Absolute: 0 10*3/uL (ref 0.0–0.1)
Basophils Relative: 0 %
Eosinophils Absolute: 0.2 10*3/uL (ref 0.0–0.5)
Eosinophils Relative: 2 %
HCT: 30 % — ABNORMAL LOW (ref 36.0–46.0)
Hemoglobin: 9.4 g/dL — ABNORMAL LOW (ref 12.0–15.0)
Immature Granulocytes: 0 %
Lymphocytes Relative: 12 %
Lymphs Abs: 0.9 10*3/uL (ref 0.7–4.0)
MCH: 28.6 pg (ref 26.0–34.0)
MCHC: 31.3 g/dL (ref 30.0–36.0)
MCV: 91.2 fL (ref 80.0–100.0)
Monocytes Absolute: 0.5 10*3/uL (ref 0.1–1.0)
Monocytes Relative: 7 %
Neutro Abs: 5.7 10*3/uL (ref 1.7–7.7)
Neutrophils Relative %: 79 %
Platelets: 330 10*3/uL (ref 150–400)
RBC: 3.29 MIL/uL — ABNORMAL LOW (ref 3.87–5.11)
RDW: 14.6 % (ref 11.5–15.5)
WBC: 7.3 10*3/uL (ref 4.0–10.5)
nRBC: 0 % (ref 0.0–0.2)

## 2022-01-26 LAB — COMPREHENSIVE METABOLIC PANEL
ALT: 14 U/L (ref 0–44)
AST: 20 U/L (ref 15–41)
Albumin: 3.1 g/dL — ABNORMAL LOW (ref 3.5–5.0)
Alkaline Phosphatase: 62 U/L (ref 38–126)
Anion gap: 12 (ref 5–15)
BUN: 45 mg/dL — ABNORMAL HIGH (ref 8–23)
CO2: 23 mmol/L (ref 22–32)
Calcium: 9.1 mg/dL (ref 8.9–10.3)
Chloride: 105 mmol/L (ref 98–111)
Creatinine, Ser: 1.61 mg/dL — ABNORMAL HIGH (ref 0.44–1.00)
GFR, Estimated: 34 mL/min — ABNORMAL LOW (ref >60)
Glucose, Bld: 78 mg/dL (ref 70–99)
Potassium: 4.7 mmol/L (ref 3.5–5.1)
Sodium: 140 mmol/L (ref 135–145)
Total Bilirubin: 0.5 mg/dL (ref 0.3–1.2)
Total Protein: 7.7 g/dL (ref 6.5–8.1)

## 2022-01-26 LAB — CBG MONITORING, ED
Glucose-Capillary: 76 mg/dL (ref 70–99)
Glucose-Capillary: 85 mg/dL (ref 70–99)

## 2022-01-26 LAB — CK: Total CK: 168 U/L (ref 38–234)

## 2022-01-26 LAB — LACTIC ACID, PLASMA
Lactic Acid, Venous: 3.5 mmol/L (ref 0.5–1.9)
Lactic Acid, Venous: 3.6 mmol/L (ref 0.5–1.9)

## 2022-01-26 LAB — PROTIME-INR
INR: 1.1 (ref 0.8–1.2)
Prothrombin Time: 14.1 seconds (ref 11.4–15.2)

## 2022-01-26 LAB — GLUCOSE, CAPILLARY
Glucose-Capillary: 95 mg/dL (ref 70–99)
Glucose-Capillary: 90 mg/dL (ref 70–99)

## 2022-01-26 LAB — PROCALCITONIN: Procalcitonin: 0.84 ng/mL

## 2022-01-26 LAB — LIPASE, BLOOD: Lipase: 25 U/L (ref 11–51)

## 2022-01-26 LAB — MAGNESIUM: Magnesium: 1.3 mg/dL — ABNORMAL LOW (ref 1.7–2.4)

## 2022-01-26 LAB — AMMONIA: Ammonia: 17 umol/L (ref 9–35)

## 2022-01-26 LAB — MRSA NEXT GEN BY PCR, NASAL: MRSA by PCR Next Gen: NOT DETECTED

## 2022-01-26 LAB — TSH: TSH: 2.526 u[IU]/mL (ref 0.350–4.500)

## 2022-01-26 LAB — PHOSPHORUS: Phosphorus: 3.5 mg/dL (ref 2.5–4.6)

## 2022-01-26 LAB — ETHANOL: Alcohol, Ethyl (B): <10 mg/dL (ref <10)

## 2022-01-26 MED ORDER — HALOPERIDOL LACTATE 5 MG/ML IJ SOLN
5.0000 mg | Freq: Once | INTRAMUSCULAR | Status: AC | PRN
  Administered 2022-01-26: 5 mg via INTRAVENOUS
  Filled 2022-01-26: qty 1

## 2022-01-26 MED ORDER — NIFEDIPINE ER OSMOTIC RELEASE 30 MG PO TB24
30.0000 mg | ORAL_TABLET | Freq: Every day | ORAL | Status: DC
  Administered 2022-01-26 – 2022-01-27 (×2): 30 mg via ORAL
  Filled 2022-01-26 (×3): qty 1

## 2022-01-26 MED ORDER — HYDRALAZINE HCL 20 MG/ML IJ SOLN: 5.0000 mg | Freq: Four times a day (QID) | INTRAMUSCULAR | Status: DC | PRN

## 2022-01-26 MED ORDER — PREGABALIN 50 MG PO CAPS
100.0000 mg | ORAL_CAPSULE | Freq: Two times a day (BID) | ORAL | Status: DC
  Administered 2022-01-26 – 2022-01-28 (×4): 100 mg via ORAL
  Filled 2022-01-26 (×4): qty 2

## 2022-01-26 MED ORDER — NIFEDIPINE ER OSMOTIC RELEASE 30 MG PO TB24
30.0000 mg | ORAL_TABLET | Freq: Every day | ORAL | Status: DC
  Filled 2022-01-26: qty 1

## 2022-01-26 MED ORDER — HYDRALAZINE HCL 20 MG/ML IJ SOLN
5.0000 mg | Freq: Four times a day (QID) | INTRAMUSCULAR | Status: DC | PRN
  Administered 2022-01-26: 5 mg via INTRAVENOUS
  Filled 2022-01-26: qty 1

## 2022-01-26 MED ORDER — DULOXETINE HCL 60 MG PO CPEP
60.0000 mg | ORAL_CAPSULE | Freq: Every morning | ORAL | Status: DC
  Administered 2022-01-27 – 2022-01-28 (×2): 60 mg via ORAL
  Filled 2022-01-26 (×3): qty 1

## 2022-01-26 MED ORDER — ONDANSETRON HCL 4 MG/2ML IJ SOLN
4.0000 mg | Freq: Four times a day (QID) | INTRAMUSCULAR | Status: DC | PRN
  Filled 2022-01-26: qty 2

## 2022-01-26 MED ORDER — ENOXAPARIN SODIUM 60 MG/0.6ML IJ SOSY
55.0000 mg | PREFILLED_SYRINGE | INTRAMUSCULAR | Status: DC
  Administered 2022-01-26 – 2022-01-27 (×2): 55 mg via SUBCUTANEOUS
  Filled 2022-01-26 (×3): qty 0.6

## 2022-01-26 MED ORDER — LORAZEPAM 2 MG/ML IJ SOLN: 0.5000 mg | Freq: Once | INTRAMUSCULAR | Status: DC | PRN

## 2022-01-26 NOTE — ED Notes (Signed)
Patient very combative, pulled IV out, cussing and fussing at staff. Grabbing at stuff trying to hit them. Very confused. MD made aware.

## 2022-01-26 NOTE — Progress Notes (Signed)
Bp-194/77 asymptomatic, Md made aware with order. Procardia po given. Continue to monitor.

## 2022-01-26 NOTE — ED Notes (Signed)
Patient refuses to allow staff to obtain morning labs, patient begins to fight staff. MD made aware. Will continue to monitor

## 2022-01-26 NOTE — ED Notes (Signed)
Patient refused to allow staff to draw blood at this time will retry at later time.

## 2022-01-26 NOTE — ED Notes (Signed)
Patient continues to pull at lines unable to redirect. Patient continues to be confused. MD made aware

## 2022-01-26 NOTE — Progress Notes (Signed)
Right leg cast claimed that she sustained fracture  a month ago when the affected leg caught in between  the wheels of her wheelchair.

## 2022-01-26 NOTE — ED Notes (Signed)
Patient resting, no s/s of any distress. No verbal c/o pain or discomfort. Patient able to follow verbal commands without any issues. Will continue to monitor

## 2022-01-26 NOTE — ED Notes (Signed)
Patient refused to allow staff to draw blood unable to redirect. Will continue to monitor

## 2022-01-26 NOTE — Progress Notes (Signed)
PROGRESS NOTE    [name]  VQM:[id number] DOB: 1950/11/04 DOA: 01/25/2022 PCP: [name], MD   Brief Narrative:  [name] is a 71 y.o. female admitted for acute encephalopathy with sepsis secondary to UTI and multiple electrolyte derangements thought to be secondary to profound dehydration/hypovolemia    Patient has known medical history significant for paraplegia, completed by neurogenic bladder status post chronic suprapubic Foley catheter, chronic sacral decubitus ulcer associated chronic osteomyelitis, chronic systolic heart failure, mild to moderate aortic regurgitation, due to diabetes mellitus, acquired hypothyroidism, essential hypertension, CKD stage IIIb associated baseline creatinine range 1.5-1.8, chronic anemia of chronic kidney disease associated baseline hemoglobin ranging 8-9.5.   Assessment & Plan:   Principal Problem:   Acute metabolic encephalopathy Active Problems:   Hypothyroidism   Hypertension   DM2 (diabetes mellitus, type 2) (HCC)   Hyperkalemia   Lactic acidosis   Hypothermia   Hypercalcemia   Dehydration   Stage 3b chronic kidney disease (CKD) (HCC)   Anemia of chronic kidney failure   Chronic systolic CHF (congestive heart failure) (HCC)   Acute metabolic encephalopathy, multifactorial  -Secondary to below infection, dehydration  Severe sepsis secondary to UTI, POA  Patient noted to be hypothermic tachycardia with abnormal UA and symptoms consistent with UTI  Lactic acidosis Hyperkalemia/Hypercalcemia Profound dehydration/hypovolemia -Likely secondary to above, increase p.o. intake as tolerated -IV fluids ongoing until p.o. intake improves appropriately  CKD3b -At baseline  Chronic anemia of chronic disease -Follow repeat labs  Non-insulin-dependent diabetes type 2, well controlled Lab Results  Component Value Date   HGBA1C 5.4 12/15/2021   Chronic sacral decubitus ulcer, POA  -Has been following outpatient, due to  have wound dressing changes today per husband -Appreciate wound care assistance  HFpEF HTN -Resume home medications once verified  Hypothyroidism -Resume home medications once dose verified   DVT prophylaxis: SCDs Code Status: Full Family Communication: Husband at bedside  Status is: Inpt  Dispo: The patient is from: Home              Anticipated d/c is to: TBD              Anticipated d/c date is: 48-72h              Patient currently NOT medically stable for discharge  Consultants:  None  Procedures:  None  Antimicrobials:  Cefepime x 3 days   Subjective: No acute issues or events overnight, much more awake alert oriented, review of systems otherwise unremarkable denies nausea vomiting diarrhea constipation headache fevers chills or chest pain.  Objective: Vitals:   01/26/22 0615 01/26/22 0630 01/26/22 0645 01/26/22 0738  BP: (!) 169/76 (!) 165/89 (!) 172/77 (!) 167/84  Pulse:    93  Resp:    13  Temp:    97.6 F (36.4 C)  TempSrc:    Oral  SpO2:    93%  Weight:        Intake/Output Summary (Last 24 hours) at 01/26/2022 0806 Last data filed at 01/26/2022 0707 Gross per 24 hour  Intake 1200 ml  Output --  Net 1200 ml   Filed Weights   01/25/22 2105  Weight: 113.4 kg    Examination:  General: Pleasantly resting in bed, No acute distress.  Somnolent but easily arousable HEENT: Normocephalic atraumatic.  Sclerae nonicteric, noninjected.  Extraocular movements intact bilaterally. Neck: Without mass or deformity.  Trachea is midline. Lungs: Clear to auscultate bilaterally without rhonchi, wheeze, or rales. Heart: Regular rate and  rhythm.  Without murmurs, rubs, or gallops. Abdomen: Soft, nontender, nondistended.  Without guarding or rebound. Extremities: Right foot cast clean dry intact Vascular: Sacral decubitus ulcer, POA staging as above  Data Reviewed: I have personally reviewed following labs and imaging studies  CBC: Recent Labs  Lab  01/25/22 1510 01/26/22 0436 01/26/22 0437  WBC 6.3  --  7.3  NEUTROABS 4.8  --  5.7  HGB 11.6* 10.2* 9.4*  HCT 38.5 30.0* 30.0*  MCV 93.9  --  91.2  PLT 422*  --  191   Basic Metabolic Panel: Recent Labs  Lab 01/25/22 1510 01/26/22 0436 01/26/22 0437  NA 139 142 140  K 5.6* 4.8 4.7  CL 99  --  105  CO2 24  --  23  GLUCOSE 106*  --  78  BUN 49*  --  45*  CREATININE 1.65*  --  1.61*  CALCIUM 10.2  --  9.1  MG  --   --  1.3*  PHOS  --   --  3.5   GFR: Estimated Creatinine Clearance: 37.7 mL/min (A) (by C-G formula based on SCr of 1.61 mg/dL (H)). Liver Function Tests: Recent Labs  Lab 01/25/22 1510 01/26/22 0437  AST 21 20  ALT 18 14  ALKPHOS 76 62  BILITOT 0.6 0.5  PROT 9.3* 7.7  ALBUMIN 3.7 3.1*   Recent Labs  Lab 01/26/22 0437  LIPASE 25   Recent Labs  Lab 01/26/22 0437  AMMONIA 17   Coagulation Profile: Recent Labs  Lab 01/26/22 0437  INR 1.1   Cardiac Enzymes: Recent Labs  Lab 01/26/22 0437  CKTOTAL 168   BNP (last 3 results) No results for input(s): "PROBNP" in the last 8760 hours. HbA1C: No results for input(s): "HGBA1C" in the last 72 hours. CBG: Recent Labs  Lab 01/25/22 1934 01/26/22 0735  GLUCAP 104* 76   Lipid Profile: No results for input(s): "CHOL", "HDL", "LDLCALC", "TRIG", "CHOLHDL", "LDLDIRECT" in the last 72 hours. Thyroid Function Tests: Recent Labs    01/26/22 0437  TSH 2.526   Anemia Panel: No results for input(s): "VITAMINB12", "FOLATE", "FERRITIN", "TIBC", "IRON", "RETICCTPCT" in the last 72 hours. Sepsis Labs: Recent Labs  Lab 01/25/22 1510 01/25/22 2350 01/26/22 0437  PROCALCITON  --   --  0.84  LATICACIDVEN 4.3* 3.5* 3.6*    Recent Results (from the past 240 hour(s))  Blood Culture (Routine X 2)     Status: None (Preliminary result)   Collection Time: 01/25/22  3:10 PM   Specimen: BLOOD  Result Value Ref Range Status   Specimen Description BLOOD LEFT ANTECUBITAL  Final   Special Requests    Final    BOTTLES DRAWN AEROBIC AND ANAEROBIC Blood Culture adequate volume   Culture   Final    NO GROWTH < 12 HOURS Performed at [facility], [place] [address], [place], [place] [address]    Report Status PENDING  Incomplete  Blood Culture (Routine X 2)     Status: None (Preliminary result)   Collection Time: 01/25/22  9:03 PM   Specimen: BLOOD RIGHT ARM  Result Value Ref Range Status   Specimen Description BLOOD RIGHT ARM  Final   Special Requests   Final    BOTTLES DRAWN AEROBIC AND ANAEROBIC Blood Culture adequate volume   Culture   Final    NO GROWTH < 12 HOURS Performed at [facility], [place] [address], [place],  [address]    Report Status PENDING  Incomplete         Radiology Studies: CT ABDOMEN PELVIS WO CONTRAST  Result Date: 01/26/2022 CLINICAL DATA:  Acute nonlocalized abdominal pain. Altered mental status. EXAM: CT ABDOMEN AND PELVIS WITHOUT CONTRAST TECHNIQUE: Multidetector CT imaging of the abdomen and pelvis was performed following the standard protocol without IV contrast. RADIATION DOSE REDUCTION: This exam was performed according to the departmental dose-optimization program which includes automated exposure control, adjustment of the mA and/or kV according to patient size and/or use of iterative reconstruction technique. COMPARISON:  MRI abdomen 12/15/2021. CT abdomen and pelvis 12/14/2021 FINDINGS: Lower chest: Mild atelectasis in the lung bases. Cardiac enlargement. Hepatobiliary: Cholelithiasis. No inflammatory change. No bile duct dilatation. No focal liver lesions. Pancreas: Unremarkable. No pancreatic ductal dilatation or surrounding inflammatory changes. Spleen: Normal in size without focal abnormality. Scattered calcified granulomas. Adrenals/Urinary Tract: No adrenal gland nodules. 1.6 cm left renal cyst. No imaging follow-up is indicated. Bilateral intrarenal stones. Largest on the right measures 8 mm diameter. No ureteral stones are  demonstrated. No hydronephrosis or hydroureter. 4 mm stone in the right posterior bladder may represent a recently passed ureteral stone. Bladder is decompressed with a suprapubic catheter in place. Stomach/Bowel: Stomach, small bowel, and colon are not abnormally distended. Stool fills the colon. No wall thickening or inflammatory changes. Appendix is normal. Vascular/Lymphatic: Aortic atherosclerosis. No enlarged abdominal or pelvic lymph nodes. Reproductive: Status post hysterectomy. No adnexal masses. Other: No free air or free fluid in the abdomen. Abdominal wall musculature appears intact. Musculoskeletal: Large left ischial decubitus ulceration with ulcer tract extending to the bone level. Sclerosis and cortical irregularity in the posterior ischium consistent with osteomyelitis. Similar appearance to previous study. Degenerative changes in the spine and hips. IMPRESSION: 1. Bilateral nonobstructing intrarenal stones. 2. 4 mm stone in the right bladder may represent a recently passed stone. No ureteral stone, hydronephrosis, or hydroureter. 3. Cholelithiasis without evidence of acute cholecystitis. 4. Aortic atherosclerosis. 5. Large left decubitus ulceration extending to the posterior ischium with associated osteomyelitis, similar to prior study. Electronically Signed   By: Lucienne Capers M.D.   On: 01/26/2022 00:56   CT HEAD WO CONTRAST  Result Date: 01/25/2022 CLINICAL DATA:  Mental status change EXAM: CT HEAD WITHOUT CONTRAST TECHNIQUE: Contiguous axial images were obtained from the base of the skull through the vertex without intravenous contrast. RADIATION DOSE REDUCTION: This exam was performed according to the departmental dose-optimization program which includes automated exposure control, adjustment of the mA and/or kV according to patient size and/or use of iterative reconstruction technique. COMPARISON:  12/26/2020 FINDINGS: Brain: No evidence of acute infarction, hemorrhage, hydrocephalus,  extra-axial collection or mass lesion/mass effect. Vascular: No hyperdense vessel or unexpected calcification. Skull: Normal. Negative for fracture or focal lesion. Sinuses/Orbits: No acute finding. Other: Cervical spine fixation hardware partially visualized. IMPRESSION: No acute intracranial abnormality. Electronically Signed   By: Lucrezia Europe M.D.   On: 01/25/2022 16:56   DG Chest 1 View  Result Date: 01/25/2022 CLINICAL DATA:  Cough.  Chest pain. EXAM: CHEST  1 VIEW COMPARISON:  Two-view chest x-ray 12/14/2021 FINDINGS: Heart size is exaggerated by low lung volumes. No edema or effusion is present. No focal airspace disease is present. IMPRESSION: 1. Low lung volumes. 2. No acute cardiopulmonary disease. Electronically Signed   By: San Morelle M.D.   On: 01/25/2022 16:15    Scheduled Meds:  insulin aspart  0-6 Units Subcutaneous TID WC   Continuous Infusions:  sodium chloride Stopped (01/26/22 0707)   ceFEPime (  MAXIPIME) IV Stopped (01/25/22 2155)     LOS: 1 day   Time spent: 64mn  Alayssa Flinchum C Oluwadara Gorman, DO [facility]  If 7PM-7AM, please contact night-coverage [email or web address]  01/26/2022, 8:06 AM

## 2022-01-26 NOTE — Progress Notes (Signed)
PHARMACIST - PHYSICIAN COMMUNICATION  CONCERNING:  Enoxaparin (Lovenox) for DVT Prophylaxis    RECOMMENDATION: Patient was prescribed enoxaprin '40mg'$  q24 hours for VTE prophylaxis.   Filed Weights   01/25/22 2105  Weight: 113.4 kg (250 lb)    Body mass index is 44.29 kg/m.  Estimated Creatinine Clearance: 37.7 mL/min (A) (by C-G formula based on SCr of 1.61 mg/dL (H)).   Based on [place] patient is candidate for enoxaparin 0.'5mg'$ /kg TBW SQ every 24 hours based on BMI being >30.  Patient is candidate for enoxaparin '30mg'$  every 24 hours based on CrCl <28m/min or Weight <45kg  DESCRIPTION: Pharmacy has [facility] dose [facility]  Patient is now receiving enoxaparin 55 mg every 24 hours    SBerta Minor PharmD Clinical Pharmacist  01/26/2022 2:43 PM

## 2022-01-26 NOTE — Progress Notes (Signed)
Md made aware about the high blood pressure with order. Continue to monitor.

## 2022-01-27 DIAGNOSIS — G9341 Metabolic encephalopathy: Secondary | ICD-10-CM | POA: Diagnosis not present

## 2022-01-27 LAB — GLUCOSE, CAPILLARY
Glucose-Capillary: 93 mg/dL (ref 70–99)
Glucose-Capillary: 89 mg/dL (ref 70–99)
Glucose-Capillary: 141 mg/dL — ABNORMAL HIGH (ref 70–99)
Glucose-Capillary: 90 mg/dL (ref 70–99)

## 2022-01-27 LAB — TROPONIN I (HIGH SENSITIVITY): Troponin I (High Sensitivity): 13 ng/L (ref <18)

## 2022-01-27 MED ORDER — LABETALOL HCL 5 MG/ML IV SOLN: 5.0000 mg | INTRAVENOUS | Status: DC | PRN

## 2022-01-27 MED ORDER — METOPROLOL TARTRATE 5 MG/5ML IV SOLN
5.0000 mg | Freq: Once | INTRAVENOUS | Status: AC | PRN
  Administered 2022-01-27: 5 mg via INTRAVENOUS
  Filled 2022-01-27: qty 5

## 2022-01-27 MED ORDER — LABETALOL HCL 5 MG/ML IV SOLN
5.0000 mg | INTRAVENOUS | Status: DC | PRN
  Administered 2022-01-27 – 2022-01-28 (×4): 5 mg via INTRAVENOUS
  Filled 2022-01-27 (×2): qty 4

## 2022-01-27 MED ORDER — CARVEDILOL 3.125 MG PO TABS
3.1250 mg | ORAL_TABLET | Freq: Two times a day (BID) | ORAL | Status: DC
  Administered 2022-01-27 – 2022-01-28 (×3): 3.125 mg via ORAL
  Filled 2022-01-27 (×3): qty 1

## 2022-01-27 MED ORDER — LEVOTHYROXINE SODIUM 25 MCG PO TABS
125.0000 ug | ORAL_TABLET | Freq: Every day | ORAL | Status: DC
  Administered 2022-01-27 – 2022-01-28 (×2): 125 ug via ORAL
  Filled 2022-01-27 (×2): qty 1

## 2022-01-27 MED ORDER — SODIUM CHLORIDE 0.9 % IV SOLN: INTRAVENOUS | Status: DC

## 2022-01-27 MED ORDER — SODIUM CHLORIDE 0.9 % IV BOLUS
500.0000 mL | Freq: Once | INTRAVENOUS | Status: AC
  Administered 2022-01-26: 500 mL via INTRAVENOUS

## 2022-01-27 MED ORDER — HYDRALAZINE HCL 20 MG/ML IJ SOLN: 5.0000 mg | Freq: Four times a day (QID) | INTRAMUSCULAR | Status: DC | PRN

## 2022-01-27 MED ORDER — OXYCODONE HCL 5 MG PO TABS
5.0000 mg | ORAL_TABLET | Freq: Once | ORAL | Status: AC
  Administered 2022-01-27: 5 mg via ORAL
  Filled 2022-01-27: qty 1

## 2022-01-27 MED ORDER — BACLOFEN 10 MG PO TABS
10.0000 mg | ORAL_TABLET | Freq: Once | ORAL | Status: AC
  Administered 2022-01-27: 10 mg via ORAL
  Filled 2022-01-27: qty 1

## 2022-01-27 MED ORDER — LABETALOL HCL 5 MG/ML IV SOLN
5.0000 mg | INTRAVENOUS | Status: DC | PRN
  Filled 2022-01-27: qty 4

## 2022-01-27 NOTE — Plan of Care (Signed)

## 2022-01-27 NOTE — Progress Notes (Signed)
PROGRESS NOTE    [name]  XBJ:[id number] DOB: 11-20-1950 DOA: 01/25/2022 PCP: [name], MD   Brief Narrative:  [name] is a 71 y.o. female admitted for acute encephalopathy with sepsis secondary to UTI and multiple electrolyte derangements thought to be secondary to profound dehydration/hypovolemia    Patient has known medical history significant for paraplegia, completed by neurogenic bladder status post chronic suprapubic Foley catheter, chronic sacral decubitus ulcer associated chronic osteomyelitis, chronic systolic heart failure, mild to moderate aortic regurgitation, due to diabetes mellitus, acquired hypothyroidism, essential hypertension, CKD stage IIIb associated baseline creatinine range 1.5-1.8, chronic anemia of chronic kidney disease associated baseline hemoglobin ranging 8-9.5.  Assessment & Plan:   Principal Problem:   Acute metabolic encephalopathy Active Problems:   Hypothyroidism   Hypertension   DM2 (diabetes mellitus, type 2) (HCC)   Hyperkalemia   Lactic acidosis   Hypothermia   Hypercalcemia   Dehydration   Stage 3b chronic kidney disease (CKD) (HCC)   Anemia of chronic kidney failure   Chronic systolic CHF (congestive heart failure) (HCC)  Acute metabolic encephalopathy, multifactorial, resolving -Secondary to below infection, dehydration  Severe sepsis secondary to UTI, POA  -Patient noted to be hypothermic tachycardia with abnormal UA and symptoms consistent with UTI -Continue cefepime x3 days  Lactic acidosis Hyperkalemia/Hypercalcemia Profound dehydration/hypovolemia -Likely secondary to above, increase p.o. intake as tolerated -IV fluids ongoing until p.o. intake improves appropriately  CKD3b -At baseline  Chronic anemia of chronic disease -Follow repeat labs  Non-insulin-dependent diabetes type 2, well controlled Lab Results  Component Value Date   HGBA1C 5.4 12/15/2021   Chronic sacral decubitus ulcer, POA  -Has  been following outpatient, due to have wound dressing changes today per husband -Appreciate wound care assistance  HFpEF HTN Sinus tachycardia -Continue nifedipine, carvedilol -Tachycardia improving  Hypothyroidism -Resume home medications once dose verified  DVT prophylaxis: SCDs Code Status: Full Family Communication: Husband at bedside  Status is: Inpt  Dispo: The patient is from: Home              Anticipated d/c is to: Home              Anticipated d/c date is: 48-72h              Patient currently NOT medically stable for discharge  Consultants:  None  Procedures:  None  Antimicrobials:  Cefepime x 3 days   Subjective: No acute issues or events overnight denies nausea vomiting diarrhea constipation headache fevers chills or chest pain, requesting discharge home which we discussed given her ongoing tachycardia and infection we would like to monitor her for additional 24 hours to ensure no disseminated infection as cultures are still pending  Objective: Vitals:   01/26/22 2351 01/27/22 0119 01/27/22 0203 01/27/22 0300  BP: (!) 149/78 (!) 149/74 (!) 144/78 (!) 149/81  Pulse: (!) 139 (!) 115 (!) 120 (!) 110  Resp: (!) '21 19 15 18  '$ Temp:    98.8 F (37.1 C)  TempSrc:    Oral  SpO2:  97% 97% 96%  Weight:        Intake/Output Summary (Last 24 hours) at 01/27/2022 0757 Last data filed at 01/27/2022 0500 Gross per 24 hour  Intake 193.94 ml  Output 2850 ml  Net -2656.06 ml    Filed Weights   01/25/22 2105  Weight: 113.4 kg    Examination:  General: Pleasantly resting in bed, No acute distress.  HEENT: Normocephalic atraumatic.  Sclerae nonicteric, noninjected.  Extraocular movements intact bilaterally. Neck: Without mass or deformity.  Trachea is midline. Lungs: Clear to auscultate bilaterally without rhonchi, wheeze, or rales. Heart: Regular rate and rhythm.  Without murmurs, rubs, or gallops. Abdomen: Soft, nontender, nondistended.  Without guarding  or rebound. Extremities: Right foot cast clean dry intact Vascular: Sacral decubitus ulcer, POA staging as above  Data Reviewed: I have personally reviewed following labs and imaging studies  CBC: Recent Labs  Lab 01/25/22 1510 01/26/22 0436 01/26/22 0437  WBC 6.3  --  7.3  NEUTROABS 4.8  --  5.7  HGB 11.6* 10.2* 9.4*  HCT 38.5 30.0* 30.0*  MCV 93.9  --  91.2  PLT 422*  --  623    Basic Metabolic Panel: Recent Labs  Lab 01/25/22 1510 01/26/22 0436 01/26/22 0437  NA 139 142 140  K 5.6* 4.8 4.7  CL 99  --  105  CO2 24  --  23  GLUCOSE 106*  --  78  BUN 49*  --  45*  CREATININE 1.65*  --  1.61*  CALCIUM 10.2  --  9.1  MG  --   --  1.3*  PHOS  --   --  3.5    GFR: Estimated Creatinine Clearance: 37.7 mL/min (A) (by C-G formula based on SCr of 1.61 mg/dL (H)). Liver Function Tests: Recent Labs  Lab 01/25/22 1510 01/26/22 0437  AST 21 20  ALT 18 14  ALKPHOS 76 62  BILITOT 0.6 0.5  PROT 9.3* 7.7  ALBUMIN 3.7 3.1*    Recent Labs  Lab 01/26/22 0437  LIPASE 25    Recent Labs  Lab 01/26/22 0437  AMMONIA 17    Coagulation Profile: Recent Labs  Lab 01/26/22 0437  INR 1.1    Cardiac Enzymes: Recent Labs  Lab 01/26/22 0437  CKTOTAL 168    BNP (last 3 results) No results for input(s): "PROBNP" in the last 8760 hours. HbA1C: No results for input(s): "HGBA1C" in the last 72 hours. CBG: Recent Labs  Lab 01/26/22 0735 01/26/22 1155 01/26/22 1649 01/26/22 2111 01/27/22 0603  GLUCAP 76 85 95 90 93    Lipid Profile: No results for input(s): "CHOL", "HDL", "LDLCALC", "TRIG", "CHOLHDL", "LDLDIRECT" in the last 72 hours. Thyroid Function Tests: Recent Labs    01/26/22 0437  TSH 2.526    Anemia Panel: No results for input(s): "VITAMINB12", "FOLATE", "FERRITIN", "TIBC", "IRON", "RETICCTPCT" in the last 72 hours. Sepsis Labs: Recent Labs  Lab 01/25/22 1510 01/25/22 2350 01/26/22 0437  PROCALCITON  --   --  0.84  LATICACIDVEN 4.3* 3.5*  3.6*     Recent Results (from the past 240 hour(s))  Blood Culture (Routine X 2)     Status: None (Preliminary result)   Collection Time: 01/25/22  3:10 PM   Specimen: BLOOD  Result Value Ref Range Status   Specimen Description BLOOD LEFT ANTECUBITAL  Final   Special Requests   Final    BOTTLES DRAWN AEROBIC AND ANAEROBIC Blood Culture adequate volume   Culture   Final    NO GROWTH < 12 HOURS Performed at [facility], [place] [address], [place], [place] [address]    Report Status PENDING  Incomplete  Blood Culture (Routine X 2)     Status: None (Preliminary result)   Collection Time: 01/25/22  9:03 PM   Specimen: BLOOD RIGHT ARM  Result Value Ref Range Status   Specimen Description BLOOD RIGHT ARM  Final   Special Requests   Final  BOTTLES DRAWN AEROBIC AND ANAEROBIC Blood Culture adequate volume   Culture   Final    NO GROWTH < 12 HOURS Performed at [facility], [place] [address], [place], [place] [address]    Report Status PENDING  Incomplete  Urine Culture     Status: Abnormal (Preliminary result)   Collection Time: 01/25/22 10:47 PM   Specimen: Urine, Clean Catch  Result Value Ref Range Status   Specimen Description URINE, CLEAN CATCH  Final   Special Requests   Final    NONE Performed at [facility], [place] [address], [place], [place] [address]    Culture 20,000 COLONIES/mL GRAM NEGATIVE RODS (A)  Final   Report Status PENDING  Incomplete  MRSA Next Gen by PCR, Nasal     Status: None   Collection Time: 01/26/22  5:44 PM   Specimen: Nasal Mucosa; Nasal Swab  Result Value Ref Range Status   MRSA by PCR Next Gen NOT DETECTED NOT DETECTED Final    Comment: (NOTE) The GeneXpert MRSA Assay (FDA approved for NASAL specimens only), is one component of a comprehensive MRSA colonization surveillance program. It is not intended to diagnose MRSA infection nor to guide or monitor treatment for MRSA infections. Test performance is not FDA approved in patients  less than 61 years old. Performed at [facility], [place] [address], [place],  [address]          Radiology Studies: CT ABDOMEN PELVIS WO CONTRAST  Result Date: 01/26/2022 CLINICAL DATA:  Acute nonlocalized abdominal pain. Altered mental status. EXAM: CT ABDOMEN AND PELVIS WITHOUT CONTRAST TECHNIQUE: Multidetector CT imaging of the abdomen and pelvis was performed following the standard protocol without IV contrast. RADIATION DOSE REDUCTION: This exam was performed according to the departmental dose-optimization program which includes automated exposure control, adjustment of the mA and/or kV according to patient size and/or use of iterative reconstruction technique. COMPARISON:  MRI abdomen 12/15/2021. CT abdomen and pelvis 12/14/2021 FINDINGS: Lower chest: Mild atelectasis in the lung bases. Cardiac enlargement. Hepatobiliary: Cholelithiasis. No inflammatory change. No bile duct dilatation. No focal liver lesions. Pancreas: Unremarkable. No pancreatic ductal dilatation or surrounding inflammatory changes. Spleen: Normal in size without focal abnormality. Scattered calcified granulomas. Adrenals/Urinary Tract: No adrenal gland nodules. 1.6 cm left renal cyst. No imaging follow-up is indicated. Bilateral intrarenal stones. Largest on the right measures 8 mm diameter. No ureteral stones are demonstrated. No hydronephrosis or hydroureter. 4 mm stone in the right posterior bladder may represent a recently passed ureteral stone. Bladder is decompressed with a suprapubic catheter in place. Stomach/Bowel: Stomach, small bowel, and colon are not abnormally distended. Stool fills the colon. No wall thickening or inflammatory changes. Appendix is normal. Vascular/Lymphatic: Aortic atherosclerosis. No enlarged abdominal or pelvic lymph nodes. Reproductive: Status post hysterectomy. No adnexal masses. Other: No free air or free fluid in the abdomen. Abdominal wall musculature appears intact.  Musculoskeletal: Large left ischial decubitus ulceration with ulcer tract extending to the bone level. Sclerosis and cortical irregularity in the posterior ischium consistent with osteomyelitis. Similar appearance to previous study. Degenerative changes in the spine and hips. IMPRESSION: 1. Bilateral nonobstructing intrarenal stones. 2. 4 mm stone in the right bladder may represent a recently passed stone. No ureteral stone, hydronephrosis, or hydroureter. 3. Cholelithiasis without evidence of acute cholecystitis. 4. Aortic atherosclerosis. 5. Large left decubitus ulceration extending to the posterior ischium with associated osteomyelitis, similar to prior study. Electronically Signed   By: Oren Beckmann.D.  On: 01/26/2022 00:56   CT HEAD WO CONTRAST  Result Date: 01/25/2022 CLINICAL DATA:  Mental status change EXAM: CT HEAD WITHOUT CONTRAST TECHNIQUE: Contiguous axial images were obtained from the base of the skull through the vertex without intravenous contrast. RADIATION DOSE REDUCTION: This exam was performed according to the departmental dose-optimization program which includes automated exposure control, adjustment of the mA and/or kV according to patient size and/or use of iterative reconstruction technique. COMPARISON:  12/26/2020 FINDINGS: Brain: No evidence of acute infarction, hemorrhage, hydrocephalus, extra-axial collection or mass lesion/mass effect. Vascular: No hyperdense vessel or unexpected calcification. Skull: Normal. Negative for fracture or focal lesion. Sinuses/Orbits: No acute finding. Other: Cervical spine fixation hardware partially visualized. IMPRESSION: No acute intracranial abnormality. Electronically Signed   By: Lucrezia Europe M.D.   On: 01/25/2022 16:56   DG Chest 1 View  Result Date: 01/25/2022 CLINICAL DATA:  Cough.  Chest pain. EXAM: CHEST  1 VIEW COMPARISON:  Two-view chest x-ray 12/14/2021 FINDINGS: Heart size is exaggerated by low lung volumes. No edema or effusion  is present. No focal airspace disease is present. IMPRESSION: 1. Low lung volumes. 2. No acute cardiopulmonary disease. Electronically Signed   By: San Morelle M.D.   On: 01/25/2022 16:15    Scheduled Meds:  DULoxetine  60 mg Oral q AM   enoxaparin (LOVENOX) injection  55 mg Subcutaneous Q24H   insulin aspart  0-6 Units Subcutaneous TID WC   NIFEdipine  30 mg Oral QHS   pregabalin  100 mg Oral BID   Continuous Infusions:  ceFEPime (MAXIPIME) IV 2 g (01/26/22 2148)     LOS: 2 days   Time spent: 60mn  Donyale Berthold C Hadley Soileau, DO [facility]  If 7PM-7AM, please contact night-coverage [email or web address]  01/27/2022, 7:57 AM

## 2022-01-28 DIAGNOSIS — G9341 Metabolic encephalopathy: Secondary | ICD-10-CM | POA: Diagnosis not present

## 2022-01-28 LAB — BASIC METABOLIC PANEL
Anion gap: 11 (ref 5–15)
BUN: 24 mg/dL — ABNORMAL HIGH (ref 8–23)
CO2: 24 mmol/L (ref 22–32)
Calcium: 9.3 mg/dL (ref 8.9–10.3)
Chloride: 106 mmol/L (ref 98–111)
Creatinine, Ser: 1.23 mg/dL — ABNORMAL HIGH (ref 0.44–1.00)
GFR, Estimated: 46 mL/min — ABNORMAL LOW (ref >60)
Glucose, Bld: 101 mg/dL — ABNORMAL HIGH (ref 70–99)
Potassium: 4.4 mmol/L (ref 3.5–5.1)
Sodium: 141 mmol/L (ref 135–145)

## 2022-01-28 LAB — CBC
HCT: 31.5 % — ABNORMAL LOW (ref 36.0–46.0)
Hemoglobin: 9.8 g/dL — ABNORMAL LOW (ref 12.0–15.0)
MCH: 27.8 pg (ref 26.0–34.0)
MCHC: 31.1 g/dL (ref 30.0–36.0)
MCV: 89.5 fL (ref 80.0–100.0)
Platelets: 335 10*3/uL (ref 150–400)
RBC: 3.52 MIL/uL — ABNORMAL LOW (ref 3.87–5.11)
RDW: 14.8 % (ref 11.5–15.5)
WBC: 6.2 10*3/uL (ref 4.0–10.5)
nRBC: 0 % (ref 0.0–0.2)

## 2022-01-28 LAB — URINE CULTURE: Culture: 20000 — AB

## 2022-01-28 LAB — GLUCOSE, CAPILLARY
Glucose-Capillary: 97 mg/dL (ref 70–99)
Glucose-Capillary: 98 mg/dL (ref 70–99)

## 2022-01-28 MED ORDER — CARVEDILOL 6.25 MG PO TABS: 6.2500 mg | ORAL_TABLET | Freq: Two times a day (BID) | ORAL | Status: DC

## 2022-01-28 MED ORDER — CARVEDILOL 12.5 MG PO TABS: 6.2500 mg | ORAL_TABLET | Freq: Two times a day (BID) | ORAL | 0 refills | Status: DC

## 2022-01-28 MED ORDER — BACLOFEN 10 MG PO TABS: 10.0000 mg | ORAL_TABLET | Freq: Four times a day (QID) | ORAL | Status: DC | PRN

## 2022-01-28 NOTE — Progress Notes (Signed)
Pharmacy Antibiotic Note  [name] is a 71 y.o. female for which pharmacy has been consulted for cefepime dosing for Pseudomonas aeruginosa UTI.  Patient with a history of myelopathy s/p cervical spine surgery resulting in paraplegia, history of sacral ulcer complicated by osteomyelitis, diabetes, neurogenic bladder s/p suprapubic catheter placement.  SCr down to 1.23  WBC 6.2; afebrile  Plan: Continue Cefepime 2g IV q12h for 7 days  Monitor daily CBC, temp, SCr, and for clinical signs of improvement  Weight: 77.4 kg (170 lb 10.2 oz)  Temp (24hrs), Avg:98.5 F (36.9 C), Min:97.7 F (36.5 C), Max:98.9 F (37.2 C)  Recent Labs  Lab 01/25/22 1510 01/25/22 2350 01/26/22 0437 01/28/22 0058  WBC 6.3  --  7.3 6.2  CREATININE 1.65*  --  1.61* 1.23*  LATICACIDVEN 4.3* 3.5* 3.6*  --      Estimated Creatinine Clearance: 40.1 mL/min (A) (by C-G formula based on SCr of 1.23 mg/dL (H)).    Allergies  Allergen Reactions   Ivp Dye [Iodinated Contrast Media] Other (See Comments)    "hot and sweaty and almost passed out"   Aspirin Hives   Morphine And Related Itching and Rash   Sulfa Antibiotics Hives    Antimicrobials this admission: cefepime 10/21 >> (10/28)  Microbiology results: 10/21 Urine Cx: Pseudomonas aeruginosa (pan-sens) 10/21 Blood Cx x2: NGTD x 3d  10/22 MRSA PCR: -ve  Thank you for allowing pharmacy to be a part of this patient's care.  Luisa Hart, PharmD, BCPS Clinical Pharmacist 01/28/2022 9:23 AM   Please refer to AMION for pharmacy phone number

## 2022-01-28 NOTE — Plan of Care (Signed)

## 2022-01-28 NOTE — Care Management Important Message (Signed)
Important Message  Patient Details  Name: [name] MRN: [id number] Date of Birth: [birth year]   Medicare Important Message Given:  Yes     [name] 01/28/2022, 3:43 PM

## 2022-01-28 NOTE — Discharge Summary (Signed)
Physician Discharge Summary  RITIKA HELLICKSON XAJ:[id number] DOB: 1950-10-24 DOA: 01/25/2022  PCP: [name], MD  Admit date: 01/25/2022 Discharge date: 01/28/2022  Admitted From: Home Disposition: Home  Recommendations for Outpatient Follow-up:  Follow up with PCP in 1-2 weeks  Discharge Condition: Stable CODE STATUS: Full Diet recommendation: Low-salt low-fat diet  Brief/Interim Summary: [name] is a 71 y.o. female admitted for acute encephalopathy with sepsis secondary to UTI and multiple electrolyte derangements thought to be secondary to profound dehydration/hypovolemia     Patient has known medical history significant for paraplegia, completed by neurogenic bladder status post chronic suprapubic Foley catheter, chronic sacral decubitus ulcer associated chronic osteomyelitis, chronic systolic heart failure, mild to moderate aortic regurgitation, due to diabetes mellitus, acquired hypothyroidism, essential hypertension, CKD stage IIIb associated baseline creatinine range 1.5-1.8, chronic anemia of chronic kidney disease associated baseline hemoglobin ranging 8-9.5.   Patient admitted as above with acute metabolic encephalopathy in setting of acute UTI with lactic acidosis and lab abnormalities in the setting of profound hypovolemia.  Patient improved with IV fluids and antibiotics.  At this time she is back to baseline otherwise stable and agreeable for discharge home.  She has completed 3 days of antibiotics consistent with standard of care, patient's urine culture notable for Pseudomonas with only 20,000 colonies but otherwise remainder of cultures remain negative.  Medication changes as below transition from metoprolol to carvedilol for improved blood pressure control and heart rate control, may need to titrate these medications in the outpatient setting in the next 1 to 2 weeks per PCP.  Otherwise stable and agreeable for discharge home  Discharge Diagnoses:   Acute  metabolic encephalopathy, multifactorial, resolving -Secondary to below infection, dehydration   Severe sepsis secondary to UTI, POA  -Patient noted to be hypothermic tachycardia with abnormal UA and symptoms consistent with UTI -Completed antibiotic course   Lactic acidosis Hyperkalemia/Hypercalcemia Profound dehydration/hypovolemia -Likely secondary to above, increase p.o. intake as tolerated -IV fluids for skin needed given increased p.o. intake   CKD3b -At baseline   Chronic anemia of chronic disease -Follow repeat labs   Non-insulin-dependent diabetes type 2, well controlled Recent Labs       Lab Results  Component Value Date    HGBA1C 5.4 12/15/2021      Chronic sacral decubitus ulcer, POA  -Has been following outpatient, due to have wound dressing changes today per husband -Appreciate wound care assistance   HFpEF HTN Sinus tachycardia -Continue nifedipine, carvedilol -Tachycardia improving   Hypothyroidism -Continue home Synthroid   Discharge Instructions  Discharge Instructions     Discharge patient   Complete by: As directed    Discharge disposition: 01-Home or Self Care   Discharge patient date: 01/28/2022      Allergies as of 01/28/2022       Reactions   Ivp Dye [iodinated Contrast Media] Other (See Comments)   "hot and sweaty and almost passed out"   Aspirin Hives   Morphine And Related Itching, Rash   Sulfa Antibiotics Hives        Medication List     STOP taking these medications    doxycycline 100 MG tablet Commonly known as: VIBRA-TABS   metoprolol tartrate 100 MG tablet Commonly known as: LOPRESSOR       TAKE these medications    baclofen 10 MG tablet Commonly known as: LIORESAL Take 10 mg by mouth 4 (four) times daily as needed for muscle spasms.   bisacodyl 10 MG suppository Commonly known as:  DULCOLAX Place 1 suppository (10 mg total) rectally daily as needed for severe constipation.   carvedilol 12.5 MG  tablet Commonly known as: COREG Take 0.5 tablets (6.25 mg total) by mouth 2 (two) times daily with a meal.   collagenase 250 UNIT/GM ointment Commonly known as: SANTYL Apply 1 Application topically daily.   DULoxetine 60 MG capsule Commonly known as: CYMBALTA Take 60 mg by mouth in the morning.   folic acid 1 MG tablet Commonly known as: FOLVITE Take 1 mg by mouth in the morning.   furosemide 40 MG tablet Commonly known as: LASIX Take 40 mg by mouth in the morning.   levothyroxine 125 MCG tablet Commonly known as: SYNTHROID Take 1 tablet (125 mcg total) by mouth daily before breakfast.   metFORMIN 1000 MG tablet Commonly known as: GLUCOPHAGE Take 1,000 mg by mouth 2 (two) times daily.   NIFEdipine 30 MG 24 hr tablet Commonly known as: ADALAT CC Take 30 mg by mouth at bedtime.   oxyCODONE-acetaminophen 5-325 MG tablet Commonly known as: PERCOCET/ROXICET Take 1-2 tablets by mouth every 4 (four) hours as needed for severe pain.   polyethylene glycol powder 17 GM/SCOOP powder Commonly known as: GLYCOLAX/MIRALAX Take 17 g by mouth daily as needed (constipation).   pregabalin 100 MG capsule Commonly known as: LYRICA Take 100 mg by mouth 2 (two) times daily.   senna 8.6 MG Tabs tablet Commonly known as: SENOKOT Take 8.6 mg by mouth 2 (two) times daily as needed (constipation).   ZINC PO Take 1 tablet by mouth in the morning.        Allergies  Allergen Reactions   Ivp Dye [Iodinated Contrast Media] Other (See Comments)    "hot and sweaty and almost passed out"   Aspirin Hives   Morphine And Related Itching and Rash   Sulfa Antibiotics Hives    Consultations: Cardiology  Procedures/Studies: CT ABDOMEN PELVIS WO CONTRAST  Result Date: 01/26/2022 CLINICAL DATA:  Acute nonlocalized abdominal pain. Altered mental status. EXAM: CT ABDOMEN AND PELVIS WITHOUT CONTRAST TECHNIQUE: Multidetector CT imaging of the abdomen and pelvis was performed following the  standard protocol without IV contrast. RADIATION DOSE REDUCTION: This exam was performed according to the departmental dose-optimization program which includes automated exposure control, adjustment of the mA and/or kV according to patient size and/or use of iterative reconstruction technique. COMPARISON:  MRI abdomen 12/15/2021. CT abdomen and pelvis 12/14/2021 FINDINGS: Lower chest: Mild atelectasis in the lung bases. Cardiac enlargement. Hepatobiliary: Cholelithiasis. No inflammatory change. No bile duct dilatation. No focal liver lesions. Pancreas: Unremarkable. No pancreatic ductal dilatation or surrounding inflammatory changes. Spleen: Normal in size without focal abnormality. Scattered calcified granulomas. Adrenals/Urinary Tract: No adrenal gland nodules. 1.6 cm left renal cyst. No imaging follow-up is indicated. Bilateral intrarenal stones. Largest on the right measures 8 mm diameter. No ureteral stones are demonstrated. No hydronephrosis or hydroureter. 4 mm stone in the right posterior bladder may represent a recently passed ureteral stone. Bladder is decompressed with a suprapubic catheter in place. Stomach/Bowel: Stomach, small bowel, and colon are not abnormally distended. Stool fills the colon. No wall thickening or inflammatory changes. Appendix is normal. Vascular/Lymphatic: Aortic atherosclerosis. No enlarged abdominal or pelvic lymph nodes. Reproductive: Status post hysterectomy. No adnexal masses. Other: No free air or free fluid in the abdomen. Abdominal wall musculature appears intact. Musculoskeletal: Large left ischial decubitus ulceration with ulcer tract extending to the bone level. Sclerosis and cortical irregularity in the posterior ischium consistent with osteomyelitis. Similar appearance to  previous study. Degenerative changes in the spine and hips. IMPRESSION: 1. Bilateral nonobstructing intrarenal stones. 2. 4 mm stone in the right bladder may represent a recently passed stone. No  ureteral stone, hydronephrosis, or hydroureter. 3. Cholelithiasis without evidence of acute cholecystitis. 4. Aortic atherosclerosis. 5. Large left decubitus ulceration extending to the posterior ischium with associated osteomyelitis, similar to prior study. Electronically Signed   By: Lucienne Capers M.D.   On: 01/26/2022 00:56   CT HEAD WO CONTRAST  Result Date: 01/25/2022 CLINICAL DATA:  Mental status change EXAM: CT HEAD WITHOUT CONTRAST TECHNIQUE: Contiguous axial images were obtained from the base of the skull through the vertex without intravenous contrast. RADIATION DOSE REDUCTION: This exam was performed according to the departmental dose-optimization program which includes automated exposure control, adjustment of the mA and/or kV according to patient size and/or use of iterative reconstruction technique. COMPARISON:  12/26/2020 FINDINGS: Brain: No evidence of acute infarction, hemorrhage, hydrocephalus, extra-axial collection or mass lesion/mass effect. Vascular: No hyperdense vessel or unexpected calcification. Skull: Normal. Negative for fracture or focal lesion. Sinuses/Orbits: No acute finding. Other: Cervical spine fixation hardware partially visualized. IMPRESSION: No acute intracranial abnormality. Electronically Signed   By: Lucrezia Europe M.D.   On: 01/25/2022 16:56   DG Chest 1 View  Result Date: 01/25/2022 CLINICAL DATA:  Cough.  Chest pain. EXAM: CHEST  1 VIEW COMPARISON:  Two-view chest x-ray 12/14/2021 FINDINGS: Heart size is exaggerated by low lung volumes. No edema or effusion is present. No focal airspace disease is present. IMPRESSION: 1. Low lung volumes. 2. No acute cardiopulmonary disease. Electronically Signed   By: San Morelle M.D.   On: 01/25/2022 16:15   DG Ankle Complete Right  Result Date: 01/03/2022 Please see detailed radiograph report in office note.    Subjective: No acute issues or events overnight   Discharge Exam: Vitals:   01/28/22 0809  01/28/22 1100  BP: (!) 181/86 (!) 159/91  Pulse: (!) 106 (!) 122  Resp: 17 15  Temp: 98.9 F (37.2 C) 98.8 F (37.1 C)  SpO2: 97% 98%   Vitals:   01/28/22 0300 01/28/22 0435 01/28/22 0809 01/28/22 1100  BP: (!) 167/81  (!) 181/86 (!) 159/91  Pulse: (!) 105  (!) 106 (!) 122  Resp: '16  17 15  '$ Temp: 98.8 F (37.1 C)  98.9 F (37.2 C) 98.8 F (37.1 C)  TempSrc: Oral  Oral Oral  SpO2: 97%  97% 98%  Weight:  77.4 kg      General: Pleasantly resting in bed, No acute distress.  HEENT: Normocephalic atraumatic.  Sclerae nonicteric, noninjected.  Extraocular movements intact bilaterally. Neck: Without mass or deformity.  Trachea is midline. Lungs: Clear to auscultate bilaterally without rhonchi, wheeze, or rales. Heart: Regular rate and rhythm.  Without murmurs, rubs, or gallops. Abdomen: Soft, nontender, nondistended.  Without guarding or rebound. Extremities: Right foot cast clean dry intact Vascular: Sacral decubitus ulcer, POA staging as above    The results of significant diagnostics from this hospitalization (including imaging, microbiology, ancillary and laboratory) are listed below for reference.     Microbiology: Recent Results (from the past 240 hour(s))  Blood Culture (Routine X 2)     Status: None (Preliminary result)   Collection Time: 01/25/22  3:10 PM   Specimen: BLOOD  Result Value Ref Range Status   Specimen Description BLOOD LEFT ANTECUBITAL  Final   Special Requests   Final    BOTTLES DRAWN AEROBIC AND ANAEROBIC Blood Culture adequate volume  Culture   Final    NO GROWTH 3 DAYS Performed at [facility], [place] [address], [place], [place] [address]    Report Status PENDING  Incomplete  Blood Culture (Routine X 2)     Status: None (Preliminary result)   Collection Time: 01/25/22  9:03 PM   Specimen: BLOOD RIGHT ARM  Result Value Ref Range Status   Specimen Description BLOOD RIGHT ARM  Final   Special Requests   Final    BOTTLES DRAWN AEROBIC AND  ANAEROBIC Blood Culture adequate volume   Culture   Final    NO GROWTH 3 DAYS Performed at [facility], [place] [address], [place], [place] [address]    Report Status PENDING  Incomplete  Urine Culture     Status: Abnormal   Collection Time: 01/25/22 10:47 PM   Specimen: Urine, Clean Catch  Result Value Ref Range Status   Specimen Description URINE, CLEAN CATCH  Final   Special Requests   Final    NONE Performed at [facility], [place] [address], [place], [place] [address]    Culture 20,000 COLONIES/mL PSEUDOMONAS AERUGINOSA (A)  Final   Report Status 01/28/2022 FINAL  Final   Organism ID, Bacteria PSEUDOMONAS AERUGINOSA (A)  Final      Susceptibility   Pseudomonas aeruginosa - MIC*    CEFTAZIDIME 4 SENSITIVE Sensitive     CIPROFLOXACIN 0.5 SENSITIVE Sensitive     GENTAMICIN <=1 SENSITIVE Sensitive     IMIPENEM 2 SENSITIVE Sensitive     PIP/TAZO 8 SENSITIVE Sensitive     * 20,000 COLONIES/mL PSEUDOMONAS AERUGINOSA  MRSA Next Gen by PCR, Nasal     Status: None   Collection Time: 01/26/22  5:44 PM   Specimen: Nasal Mucosa; Nasal Swab  Result Value Ref Range Status   MRSA by PCR Next Gen NOT DETECTED NOT DETECTED Final    Comment: (NOTE) The GeneXpert MRSA Assay (FDA approved for NASAL specimens only), is one component of a comprehensive MRSA colonization surveillance program. It is not intended to diagnose MRSA infection nor to guide or monitor treatment for MRSA infections. Test performance is not FDA approved in patients less than 8 years old. Performed at [facility], [place] [address] [place], [place] [address]      Labs: BNP (last 3 results) No results for input(s): "BNP" in the last 8760 hours. Basic Metabolic Panel: Recent Labs  Lab 01/25/22 1510 01/26/22 0436 01/26/22 0437 01/28/22 0058  NA 139 142 140 141  K 5.6* 4.8 4.7 4.4  CL 99  --  105 106  CO2 24  --  23 24  GLUCOSE 106*  --  78 101*  BUN 49*  --  45* 24*  CREATININE 1.65*  --   1.61* 1.23*  CALCIUM 10.2  --  9.1 9.3  MG  --   --  1.3*  --   PHOS  --   --  3.5  --    Liver Function Tests: Recent Labs  Lab 01/25/22 1510 01/26/22 0437  AST 21 20  ALT 18 14  ALKPHOS 76 62  BILITOT 0.6 0.5  PROT 9.3* 7.7  ALBUMIN 3.7 3.1*   Recent Labs  Lab 01/26/22 0437  LIPASE 25   Recent Labs  Lab 01/26/22 0437  AMMONIA 17   CBC: Recent Labs  Lab 01/25/22 1510 01/26/22 0436 01/26/22 0437 01/28/22 0058  WBC 6.3  --  7.3 6.2  NEUTROABS 4.8  --  5.7  --   HGB 11.6* 10.2* 9.4* 9.8*  HCT 38.5 30.0* 30.0* 31.5*  MCV 93.9  --  91.2 89.5  PLT 422*  --  330 335   Cardiac Enzymes: Recent Labs  Lab 01/26/22 0437  CKTOTAL 168   BNP: Invalid input(s): "POCBNP" CBG: Recent Labs  Lab 01/27/22 1100 01/27/22 1617 01/27/22 2115 01/28/22 0625 01/28/22 1137  GLUCAP 89 141* 90 97 98   D-Dimer No results for input(s): "DDIMER" in the last 72 hours. Hgb A1c No results for input(s): "HGBA1C" in the last 72 hours. Lipid Profile No results for input(s): "CHOL", "HDL", "LDLCALC", "TRIG", "CHOLHDL", "LDLDIRECT" in the last 72 hours. Thyroid function studies Recent Labs    01/26/22 0437  TSH 2.526   Anemia work up No results for input(s): "VITAMINB12", "FOLATE", "FERRITIN", "TIBC", "IRON", "RETICCTPCT" in the last 72 hours. Urinalysis    Component Value Date/Time   COLORURINE YELLOW 01/25/2022 2247   APPEARANCEUR HAZY (A) 01/25/2022 2247   LABSPEC 1.010 01/25/2022 2247   PHURINE 6.0 01/25/2022 2247   GLUCOSEU NEGATIVE 01/25/2022 2247   HGBUR MODERATE (A) 01/25/2022 2247   BILIRUBINUR NEGATIVE 01/25/2022 2247   [place] 01/25/2022 2247   PROTEINUR 30 (A) 01/25/2022 2247   UROBILINOGEN 1.0 11/01/2014 1127   NITRITE NEGATIVE 01/25/2022 2247   LEUKOCYTESUR LARGE (A) 01/25/2022 2247   Sepsis Labs Recent Labs  Lab 01/25/22 1510 01/26/22 0437 01/28/22 0058  WBC 6.3 7.3 6.2   Microbiology Recent Results (from the past 240 hour(s))  Blood  Culture (Routine X 2)     Status: None (Preliminary result)   Collection Time: 01/25/22  3:10 PM   Specimen: BLOOD  Result Value Ref Range Status   Specimen Description BLOOD LEFT ANTECUBITAL  Final   Special Requests   Final    BOTTLES DRAWN AEROBIC AND ANAEROBIC Blood Culture adequate volume   Culture   Final    NO GROWTH 3 DAYS Performed at [facility], [place] [address], [place], [place] [address]    Report Status PENDING  Incomplete  Blood Culture (Routine X 2)     Status: None (Preliminary result)   Collection Time: 01/25/22  9:03 PM   Specimen: BLOOD RIGHT ARM  Result Value Ref Range Status   Specimen Description BLOOD RIGHT ARM  Final   Special Requests   Final    BOTTLES DRAWN AEROBIC AND ANAEROBIC Blood Culture adequate volume   Culture   Final    NO GROWTH 3 DAYS Performed at [facility], [place] [address], [place],  [address]    Report Status PENDING  Incomplete  Urine Culture     Status: Abnormal   Collection Time: 01/25/22 10:47 PM   Specimen: Urine, Clean Catch  Result Value Ref Range Status   Specimen Description URINE, CLEAN CATCH  Final   Special Requests   Final    NONE Performed at [facility], [place] [address], [place], [place] [address]    Culture 20,000 COLONIES/mL PSEUDOMONAS AERUGINOSA (A)  Final   Report Status 01/28/2022 FINAL  Final   Organism ID, Bacteria PSEUDOMONAS AERUGINOSA (A)  Final      Susceptibility   Pseudomonas aeruginosa - MIC*    CEFTAZIDIME 4 SENSITIVE Sensitive     CIPROFLOXACIN 0.5 SENSITIVE Sensitive     GENTAMICIN <=1 SENSITIVE Sensitive     IMIPENEM 2 SENSITIVE Sensitive     PIP/TAZO 8 SENSITIVE Sensitive     * 20,000 COLONIES/mL PSEUDOMONAS  AERUGINOSA  MRSA Next Gen by PCR, Nasal     Status: None   Collection Time: 01/26/22  5:44 PM   Specimen: Nasal Mucosa; Nasal Swab  Result Value Ref Range Status   MRSA by PCR Next Gen NOT DETECTED NOT DETECTED Final    Comment: (NOTE) The GeneXpert MRSA Assay  (FDA approved for NASAL specimens only), is one component of a comprehensive MRSA colonization surveillance program. It is not intended to diagnose MRSA infection nor to guide or monitor treatment for MRSA infections. Test performance is not FDA approved in patients less than 22 years old. Performed at [facility], [place] [address], [place], [place] [address]      Time coordinating discharge: Over 30 minutes  SIGNED:   Little Ishikawa, DO [facility] 01/28/2022, 4:18 PM Pager   If 7PM-7AM, please contact night-coverage [email or web address]

## 2022-01-29 NOTE — Progress Notes (Signed)
Subjective: No chief complaint on file.  71 year old female presents the office with her husband for follow-up evaluation of right ankle fracture.She presents today for cast change.  She is off antibiotics.  No fevers or chills.  Not yet had the ABI performed.  Objective: AAO x3, NAD DP/PT pulses palpable bilaterally, CRT less than 3 seconds Upon removal of the bandage to the lateral ankle there is a full-thickness ulceration noted to the lateral aspect the ankle however the appear to be improving.  Small clear drainage but there is no frank purulence.  No surrounding erythema, ascending cellulitis.  No fluctuance or crepitus was noted." Still appears to be getting more stable. No pain with calf compression, swelling, warmth, erythema      Assessment: Full-thickness ulceration, ankle fracture  Plan: -All treatment options discussed with the patient including all alternatives, risks, complications.  -X-ray today reviewed.  Fracture noted the distal tibia, fibula.  No evidence of acute osteomyelitis.  There is some bone remodeling the fibula which I think is from the fracture as opposed osteomyelitis.  -Clean the wound with saline.  Small amount of Betadine followed by a nonstick dressing and a dry sterile dressing applied.  I then applied a below-knee fiberglass cast making sure to pad all bony prominences. -Given the clear drainage we will restart doxycycline. -She is nonweightbearing at baseline. We again discussed risk of amputation unfortunately. -Monitor for any clinical signs or symptoms of infection and directed to call the office immediately should any occur or go to the ER.  Follow-up in 1 week.    Trula Slade DPM

## 2022-01-30 ENCOUNTER — Encounter (HOSPITAL_BASED_OUTPATIENT_CLINIC_OR_DEPARTMENT_OTHER): Payer: Medicare Other | Attending: General Surgery | Admitting: General Surgery

## 2022-01-30 DIAGNOSIS — L89324 Pressure ulcer of left buttock, stage 4: Secondary | ICD-10-CM | POA: Diagnosis present

## 2022-01-30 DIAGNOSIS — Z87891 Personal history of nicotine dependence: Secondary | ICD-10-CM | POA: Diagnosis not present

## 2022-01-30 DIAGNOSIS — E039 Hypothyroidism, unspecified: Secondary | ICD-10-CM | POA: Insufficient documentation

## 2022-01-30 DIAGNOSIS — G8220 Paraplegia, unspecified: Secondary | ICD-10-CM | POA: Diagnosis not present

## 2022-01-30 DIAGNOSIS — I10 Essential (primary) hypertension: Secondary | ICD-10-CM | POA: Diagnosis not present

## 2022-01-30 DIAGNOSIS — L89313 Pressure ulcer of right buttock, stage 3: Secondary | ICD-10-CM | POA: Diagnosis not present

## 2022-01-30 DIAGNOSIS — G894 Chronic pain syndrome: Secondary | ICD-10-CM | POA: Insufficient documentation

## 2022-01-30 DIAGNOSIS — E11622 Type 2 diabetes mellitus with other skin ulcer: Secondary | ICD-10-CM | POA: Insufficient documentation

## 2022-01-30 LAB — CULTURE, BLOOD (ROUTINE X 2)
Culture: NO GROWTH
Culture: NO GROWTH
Special Requests: ADEQUATE
Special Requests: ADEQUATE

## 2022-01-30 NOTE — Progress Notes (Signed)
[name], YEAGLE ([id number]) [id number] Page 1 of 18 Visit Report for 01/30/2022 Chief Complaint Document Details Patient Name: Date of Service: [name], [name]. 01/30/2022 10:30 A M Medical Record Number: [id number] Patient Account Number: [id number] Date of Birth/Sex: Treating RN: [birth year] (71 y.o. F) Primary Care Provider: [name] Other Clinician: Referring Provider: Treating Provider/Extender: Nance Pew in Treatment: Pleasants from: Patient Chief Complaint She is here for follow up evaluation of left ischial pressure ulcer 12/30/2018; patient comes back for review of wounds in the same general area over the previously healed left ischial pressure ulcer Electronic Signature(s) Signed: 01/30/2022 11:48:45 AM By: Fredirick Maudlin MD FACS Entered By: Fredirick Maudlin on 01/30/2022 11:48:45 -------------------------------------------------------------------------------- Debridement Details Patient Name: Date of Service: [name]. 01/30/2022 10:30 A M Medical Record Number: [id number] Patient Account Number: [id number] Date of Birth/Sex: Treating RN: 1950-09-27 (71 y.o. Martyn Malay, Vaughan Basta Primary Care Provider: [name] Other Clinician: Referring Provider: Treating Provider/Extender: Nance Pew in Treatment: 161 Debridement Performed for Assessment: Wound #12 Left Ischial Tuberosity Performed By: Physician Fredirick Maudlin, MD Debridement Type: Debridement Level of Consciousness (Pre-procedure): Awake and Alert Pre-procedure Verification/Time Out Yes - 11:25 Taken: Start Time: 11:28 Pain Control: Lidocaine 4% T opical Solution T Area Debrided (L x W): otal 1 (cm) x 3 (cm) = 3 (cm) Tissue and other material debrided: Non-Viable, [place] Level: Non-Viable Tissue Debridement Description: Selective/Open Wound Instrument: Curette Bleeding: Minimum Hemostasis  Achieved: Pressure Procedural Pain: 0 Post Procedural Pain: 0 Response to Treatment: Procedure was tolerated well Level of Consciousness (Post- Awake and Alert procedure): Post Debridement Measurements of Total Wound Length: (cm) 4.2 Stage: Category/Stage IV Width: (cm) 7.3 Depth: (cm) 3.1 Volume: (cm) 74.649 Character of Wound/Ulcer Post Debridement: Improved Post Procedure Diagnosis Mitchner, [name] ([id number]) [id number] Page 2 of 18 Same as Pre-procedure Notes scribed by Baruch Gouty, RN for Dr. Celine Ahr Electronic Signature(s) Signed: 01/30/2022 11:53:22 AM By: Fredirick Maudlin MD FACS Signed: 01/30/2022 4:58:33 PM By: Baruch Gouty RN, BSN Entered By: Baruch Gouty on 01/30/2022 11:34:31 -------------------------------------------------------------------------------- Debridement Details Patient Name: Date of Service: [name]. 01/30/2022 10:30 A M Medical Record Number: [id number] Patient Account Number: [id number] Date of Birth/Sex: Treating RN: [birth year] (71 y.o. Martyn Malay, Linda Primary Care Provider: [name] Other Clinician: Referring Provider: Treating Provider/Extender: Nance Pew in Treatment: 161 Debridement Performed for Assessment: Wound #15 Right Ischium Performed By: Physician Fredirick Maudlin, MD Debridement Type: Debridement Level of Consciousness (Pre-procedure): Awake and Alert Pre-procedure Verification/Time Out Yes - 11:25 Taken: Start Time: 11:28 Pain Control: Lidocaine 4% T opical Solution T Area Debrided (L x W): otal 1.5 (cm) x 1.7 (cm) = 2.55 (cm) Tissue and other material debrided: Viable, Non-Viable, Slough, Subcutaneous, Slough Level: Skin/Subcutaneous Tissue Debridement Description: Excisional Instrument: Curette Bleeding: Minimum Hemostasis Achieved: Pressure Procedural Pain: 0 Post Procedural Pain: 0 Response to Treatment: Procedure was tolerated well Level  of Consciousness (Post- Awake and Alert procedure): Post Debridement Measurements of Total Wound Length: (cm) 1.5 Stage: Category/Stage III Width: (cm) 1.7 Depth: (cm) 2.2 Volume: (cm) 4.406 Character of Wound/Ulcer Post Debridement: Improved Post Procedure Diagnosis Same as Pre-procedure Notes scribed by Baruch Gouty, RN for Dr. Celine Ahr Electronic Signature(s) Signed: 01/30/2022 11:53:22 AM By: Fredirick Maudlin MD FACS Signed: 01/30/2022 4:58:33 PM By: Baruch Gouty RN, BSN Entered By: Baruch Gouty on 01/30/2022 11:34:46 HPI Details -------------------------------------------------------------------------------- [name] ([id number]) [id number] Page 3 of 18 Patient Name: Date  of Service: [name] 01/30/2022 10:30 A M Medical Record Number: [id number] Patient Account Number: [id number] Date of Birth/Sex: Treating RN: [birth year] (71 y.o. F) Primary Care Provider: [name] Other Clinician: Referring Provider: Treating Provider/Extender: Nance Pew in Treatment: 161 History of Present Illness Location: open ulceration of the left gluteal area, left heel and right ankle for about 5 months. Quality: Patient reports No Pain. Severity: Patient states wound(s) are getting worse. Duration: Patient has had the wound for > 5 months prior to seeking treatment at the wound center Context: The wound occurred when the patient has been paraplegic for about 3 years. Modifying Factors: Wound improving due to current treatment. ssociated Signs and Symptoms: Patient reports having foul odor. A HPI Description: this 71 year old patient who is known to have hypertension, hypothyroidism, breast cancer, chronic pain syndrome, paraplegia was noted to have a left gluteal decubitus ulcer and was brought into the hospital. During the course of her hospitalization she was debrided in the operating room by ankle wound. Bone  cultures were taken at that time but were negative but clinically she was treated for osteomyelitis because of the probing down to bone and open exposed bone. Home health has been giving her antibioticss which include vancomycin and Zosyn. The patient was a smoker until about 3 weeks ago and used to smoke about 10 cigarettes a day for a long while. 12/13/2014 - details of her operative note from 11/03/2014 were reviewed -- PROCEDURE: 1. Excisional debridement skin, subcutaneous, muscle left ischium 35 cm2 2. Excisional debridement skin, subcutaneous tissue left heel 27 cm2 3. Excisional debridement right ankle skin, subcutaneous, bone 30 cm2 01/24/2015 -- she has some issues with her wheelchair cushion but other than that is doing very well and has received Podus boots for her feet. 02/14/2015 -- she was using her old offloading boots and this seemed to have caused her a new pressure ulcer on the left posterior heel near the superior part just below the Achilles tendon. 03/07/2015 -- she has a new ulceration just to the left of the midline on her sacral region more on the left buttock and this has been there for Dr. Leland Johns and had all the wounds sharply debrided. The debridement was done for the left ischial wound, the left heel wound and the right about a week. 08/22/2015 -- was recently admitted to hospital between May 5 and 08/13/2015, with sepsis and leukocytosis due to a UTI. she was treated for a sepsis complicating Escherichia coli UTI and kidney stones. She also had metabolic and careful up at the secondary to pyelonephritis. He received broad-spectrum antibiotics initially and then received Macrobid as per urology. She was sent home on nitrofurantoin. during her admission she had a CT scan which showed exposed left ischial tuberosity without evidence of osteolysis. 09/12/2015-- the patient is having some issues with her air mattress and would like to get a opinion from medical  modalities. 10/10/2015 -- the issue with her air mattress has not yet been sorted out and the new problem seems to be a lot of odor from the wound VAC. 11/27/2015 -- the patient was admitted to the hospital between July 23 and 10/31/2015. Her problems were sepsis, osteomyelitis of the pelvic bone and acute pyelonephritis. CT of the abdomen and pelvis was consistent with a left-sided pyelonephritis with hydronephrosis and also just showed new sclerosis of the posterior portion of the left anterior pubic ramus suggestive of periosteal reaction consistent with osteomyelitis. She was  treated for the osteomyelitis with infectious disease consult recommending 6 weeks of IV antibiotics including vancomycin and Rocephin and the antibiotics were to go on until 12/10/2015. He was seen by Dr. Iran Planas plastic surgery and Dr. Linus Salmons of infectious disease. She had a suprapubic catheter placed during the admission. CT scan done on 10/28/2015 showed specifically -- New sclerosis of the posterior portion of the left inferior pubic ramus with aggressive periosteal reaction, consistent with osteomyelitis, with adjacent soft tissue gas compatible with previously described decubitus ulcer. 12/12/2015 -- she was recently seen by Dr. Linus Salmons, who noted good improvement and CRP and ESR compared to before and he has stopped her antibiotic as per plans to finish on September 4. The patient was encouraged to continue with wound care and consider hyperbaric oxygen therapy. Today she tells me that she has consented to undergo hyperbaric oxygen therapy and we can start the paperwork. 01/02/2016 -- her PCP had gained about 3 years but she still persists in having problems during hyperbaric oxygen therapy with some discomfort in the ears. 01/09/16; pressure area with underlying osteomyelitis in the left buttock. Wound bed itself has some slight amount of grayish surface slough however I do not think any debridement was necessary. There  is no exposed bone soft tissue appears stable. She is using a wound VAC 01/16/16; back for weekly wound review in conjunction with HBO. She has a deep wound over the left initial tuberosity previously treated with 6 weeks of IV antibiotics for osteomyelitis. Wound bed looks reasonably healthy although the base of this is still precariously close to bone. She has been using a wound VAC. 01/23/2016 -- she has completed her course of antibiotics and this week the only new thing is her right great toe nail was avulsed and she has got an open wound over the nailbed. 01/31/16 she has completed her course of antibiotics. Her right great toenail avulsed last week and she's been using silver alginate for this as well. Still using a wound VAC to the substantial stage IV wound over the left ischial tuberosity 03/05/2016 -- the patient has had a opinion from the plastic surgery group at [facility] and details of this are not available yet but the patient's verbal report has been heard by me. Did not sound like there was any optimistic discussion regarding reconstruction and the net result would be to continue with the wound VAC application. I will await the official reports. Addendum: -- she was seen at [facility] surgery [facility] by Dr. Tressa Busman. After a thorough review and from what I understand spending 45 minutes with the patient his assessment has been noted by me in detail and the management options were: 1. Continued pressure offloading and wound care versus operative procedures including wound excision 2. Soft tissue and bone sampling 3. If the wound gets larger wound closure would be done using a variety of plastic surgical techniques including but not limited to skin substitute, possible skin graft, local versus regional flaps, negative pressure dressing application. 4. He discussed with her details of flap surgery and the risks associated 5. He made a comment that since  the patient was operated on by Dr. Leland Johns of [facility] plastic surgery unit in [place] the patient may continue to follow-up there for further evaluation for surgical flap closure in the future. 03/19/2016 -- the patient continues to be rather depressed and frustrated with her lack of rapid progress in healing this wound especially because she thought  after hyperbaric oxygen therapy the wound would heal extremely fast. She now understands that was not the implied benefit on wound care which was the recommendation for hyperbaric oxygen therapy. I have had a lengthy discussion with the patient and her husband regarding her options: 1. Continue with collagen and wound VAC for the primary dressing and offloading and all supportive care. 2. See Dr. Iran Planas for possible placement of Acell or Integra in the OR. 3. get a second opinion from a wound care center and surrounding regions/counties 05/07/2016 -- Note from Dr. Celedonio Miyamoto, who noted that the patient has declined flap surgery. She has discussed application of A cell, and try a few applications to see how the wound progresses. She is also recommended that we could apply products here in the wound center, like Oasis. during her preop workup it was found that her hemoglobin A1c was 11% and she has now been diagnosed as having diabetes mellitus and has been put on appropriate treatment by her PCP 05/28/2016 -- tells me her blood sugars have been doing well and she has an appointment to see her PCP in the next couple of weeks to check her hemoglobin Else, [name] ([id number]) [id number] Page 4 of 18 A1c. Other than that she continues to do well. 06/25/2016 -- have not seen her back for the last month but she says her health has been about the same and she has an appointment to check the A1c next week 09/10/16 ---- was seen by Dr. Celedonio Miyamoto -- who applied Acell and saw her back in follow-up.  She has recommended silver alginate to the wound every other day and cover with foam. If no significant drainage could transition to collagen every other day. She recommended discontinuing wound VAC. There were no plans to repeat application of Acell. The patient expressed that her husband could do the wound care as going to the Wound Ctr., would cost several $100 for each visit. 10/21/2016 -- her insurance company is getting her new mattress and she is pleased about that. Other than that she has been doing dressings with PolyMem Silver and has been doing very well 02/18/2017 -- she has gone through several changes of her mattress and has not been pleased with any of them. The ventricles are still working on trying to fit her with the appropriate low air-loss mattress. She has a new wound on the gluteal area which is clearly separated from the original wound. 03/25/17-she is here in follow-up evaluation for her left ischialpressure ulcer. She remains unsatisfied with her pressure mattress. She admits to sitting multiple hours a day, in the bed. We have discussed offloading options. The wound does not appear infected. Nutrition does not appear to be a concern. Will follow-up in 4 weeks, if wound continues to be stalled may consider x-ray to evaluate for refractory osteomyelitis. 04/21/17; this is a patient that I don't know all that well. She has a chronic wound which at one point had underlying osteomyelitis in the left ischial tuberosity. This is a stage IV pressure ulcer. Over the last 3 months she has a stage II wound inferiorly to the original wound. The last time she was here her dressing was changed to silver collagen although the patient's husband who changes the dressing said that the collagen stuck to the wound and remove skin from the superficial area therefore he switched back to Bagley 05/13/17; this is a patient we've been following for a left ischial tuberosity wound which was stage  IV  at one point had underlying osteomyelitis. Over the last several months she's had a stage II wound just inferior and medial to the related to the wound. According to her husband he is using Endoform layer with collagen although this is not what I had last time. According to her husband they are using Elgie Congo with collagen although I don't quite know how that started. She was hospitalized from 1/20 through 04/30/16. This was related to a UTI. Her blood cultures were negative, urine culture showed multiple species. She did have a CT scan of the abdomen and pelvis which documented chronic osteomyelitis in the area of the wound inflammatory markers were unremarkable. She has had prior knowledge of osteomyelitis. It looks as though she received IV antibiotics in 2017 and was treated with a course of hyperbaric oxygen. 05/28/17; the wound over the left ischial tuberosity is deeper today and abuts clearly on bone. Nursing intake reported drainage. I therefore culture of the wound. The more superficial area just below this looks about the same. They once again complained that there are mattress cover is not working although apparently [facility] is been noted to see this many times in the report is that the device is functional 06/18/17; the patient had a probing area on the left ischial tuberosity that was draining purulent fluid last time. This also clearly seemed to have open bone. Culture I did showed pansensitive pseudomonas including third generation cephalosporins. I treated this with cefdinir 300 twice a day for 10 days and things seem to have improved. She has a more superficial wound just underneath this area. Amazingly she has a new air mattress through [facility]. I think they gave this to her as a parking give. In any case this now works according to the patient may have something to do with why the areas are looking better. 07/09/17; the patient has a probing area in the left  ischial tuberosity that still has some depth. However this is contracted in terms of the wound orifice although the depth is still roughly the same. There is no undermining. She also has the satellite wound which is more superficial. This appears to have a healthy surface we've been using silver collagen 08/06/17; the patient's wound is over the left ischial tuberosity and a satellite lesion just underneath this. The original wound was actually a deep stage 4 wound. We have made good progress in 2 months and there is no longer exposed bone here. 09/03/17; left ischial tuberosity actually appears to be quite healthy. I think we are making progress. No debridement is required. There is no surrounding erythema 10/01/17 I follow this patient monthly for her left ischial tuberosity wound. There is 2 areas the original area and a satellite area. The satellite area looks a lot better there is no surrounding erythema. Her husband relates that he is having trouble maintaining the dressing. This has to do with the soft tissue around it. He states he puts the collagen in but he cannot make sure that it stays in even with the ABD pads and tape that he is been using 10/29/17; patient arrives with a better looking noon today. Some of the satellite lesions have closed. using Prisma 11/26/17; the patient has a large cone-shaped area with the tip of the Cone deep within her buttock soft tissue. The walls of the Cone are epithelialized however the base is still open. The area at the base of this looks moist we've been using silver collagen.  Will change to silver alginate 12/31/2017; the wound appears to have come in fairly nicely. Using silver alginate. There is no surrounding maceration or infection 01/28/18; there is still an open area here over the left initial tuberosity. Base of this however looks healthy. There is no surrounding infection 02/25/18; the area of its open is over the left ischial tuberosity. The base of  this is where the wound is. This is a large inverted cone-shaped area with the wound at the tip. Dimensions of the wound at the tip are improved. There is a area of denuded skin about halfway towards the tip which her husband thinks may have happened today when he was bathing her. 04/20/17; the area is still open over the left initial tuberosity. 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 This is now completely closed over although it is closed over with a divot and skin over bone at the base of this. Continued aggressive offloading will be necessary. 12/30/2018 READMISSION This is a 71 year old woman with chronic paraplegia. I picked her up for her care from Dr. Con Memos in this clinic after he departed. She had a stage IV pressure wound over the left ischial tuberosity. She was treated twice for her underlying osteomyelitis and this I believe firstly in 2016 and again in 2017. There were some plans at some point for flap closure of this however she was discovered to have uncontrolled diabetes and I do not think this was ever accomplished. She ultimately healed over in this clinic and was discharged in May. She has a large cone-shaped indentation with the tip of this going towards the left ischial tuberosity. It is not an easy area to examine but at that time I thought all of this was epithelialized. Apparently there was a reopening here shortly after she left the clinic last time. She was admitted to hospital at the end of June for Klebsiella bacteremia felt to be secondary to UTI. A CT scan of the pelvis is listed below and there was initially some concern that she had underlying osteomyelitis although I believe she was seen by infectious disease and that was felt to be not the case: I do not see any new  cultures or inflammatory markers IMPRESSION: 1. No CT evidence for acute intra-abdominal or pelvic abnormality. Large volume of stool throughout the colon. 2. Enlarged fatty liver with fat sparing near the gallbladder fossa 3. Cortical scarring right kidney. Bilateral intrarenal stones without hydronephrosis. Thick-walled urinary bladder decompressed by suprapubic catheter 4. Deep left decubitus ulcer with underlying left ischial changes suggesting osteomyelitis. Her husband has been using silver collagen in the wound. She has not been systemically unwell no fever chills eating and drinking well. They rigorously offload this wound only getting up in the wheelchair when she is going to appointments the rest of the time she is in bed. 10/8; wound measures larger and she now has exposed bone. We have been using silver alginate 11/12 still using silver alginate. The patient saw Dr. Megan Salon of infectious disease. She was started on Augmentin 500 mg twice daily. She is due to follow-up with Dr. Megan Salon I believe next week. Lab work Dr. Megan Salon requested showed a sedimentation rate of 28 and CRP of 20 although her CRP 1 year ago was 18.8. Sedimentation rate 1 year  ago was 11 basic metabolic panel showed a creatinine of 1.12 Nishida, Micaylah M ([id number]) [id number] Page 5 of 18 12/3; the patient followed up with Dr. Megan Salon yesterday. She is still on Augmentin twice daily. This was directed by Dr. Megan Salon. The patient's inflammatory markers have improved which is gratifying. Her C-reactive protein was repeated yesterday and follow-up booked with infectious disease in January. In addition I have been getting secure text messages I think from palliative care through [facility] [employer]. I think they were hoping to provide services to the patient in her home. They could not get a hold of the primary physician and so they reached out to me on 2 separate  occasions. 12/17; patient last saw Dr. Megan Salon on 12/2. She is finishing up with Augmentin. Her C-reactive protein was 20 on 10/21, 10.1 on 11/19 and 17 on 12/2. The wound itself still has depth and undermining. We are using Santyl with the backing wet-to-dry 04/27/2019. The wound is gradually clearing up in terms of the surface although it is not filled in that much. Still abuts right against bone 2/4; patient with a deep pressure ulcer over the left ischial tuberosity. I thought she was going to follow-up with infectious disease to follow her inflammatory markers although the patient states that they stated that they did not need to see her unless we felt it was necessary. I will need to check their notes. In any case we ordered moistened silver collagen back with wet-to-dry to fill in the depth of the wound although apparently prism sent silver alginate which they have been using since they were here the last time. Is obviously not what we ordered. 2/25. Not much change in this wound it is over the left ischial tuberosity recurrent wound. We have been using silver collagen with backing wet-to-dry. I think the wound is about the same. There is still some tunneling from about 10-12 o'clock over the ischial tuberosity itself 3/11; pressure ulcer over the left ischial tuberosity. Since she was last here the wound VAC was started and apparently going quite well. We are able to get the home health company that accepts [place] healthcare which is in itself sometimes problematic. There is been improvements in the wound the tunneling seems to be better and is contracted nicely 4/8; 1 month follow-up. Since she was last here we have been using silver collagen under a wound VAC. Some minor contraction I think in wound volume. She is cared for diligently by her husband including pressure relief, incontinence management, nutritional support etc. 6/1; this is almost a 27-month follow-up. She is been using silver  collagen under wound VAC. Circular area over the left ischial tuberosity. She has been using silver collagen under wound VAC 7/8; 1 month follow-up. Silver collagen under the VAC not really a lot of progress. Tissue at the base of the wound which is right against bone and the tissue next that this does not look completely viable. She is not currently on any antibiotics, she had underlying osteomyelitis I need to look this over 8/16; we are using silver collagen under wound VAC to the left ischial tuberosity wound. Comes in today with absolutely no change in surface area or depth. There is no exposed bone. I did look over her infectious disease notes as I said I would do last time. She last saw Dr. Megan Salon in December 2020. She completed 6 weeks of Augmentin. This was in response to a bone culture I did showing methicillin  susceptible staph aureus and Enterococcus. She was supposed to come back to see Dr. Megan Salon at some point although they say that that appointment was canceled unless I chose to recommend return. I think there was supposed to be follow-up with inflammatory markers but I cannot see that that was ever done. She has not been on antibiotics since 9/21; monthly follow-up. We received a call from home health nurse last evening to report green drainage coming out of the wound. Lab work I ordered last time showed a white count of 5.2 a sedimentation rate of 45 and a C-reactive protein of 25 however neither one of the 2 values are substantially different from her previous values in October 2020 or December 2020. Both are slightly higher but only marginally. Otherwise no new complaints from the patient or her husband 10/19; 1 month follow-up. PCR culture I did last time showed medium quantities of Pseudomonas lower quantities Klebsiella and Enterococcus faecalis group B strep and Peptostreptococcus. I gave her Augmentin for 2 weeks. I am not really sure of my choice of this I would not cover  Pseudomonas. She is still having green drainage. Wound itself looks satisfactory there is not a lot of depth wound bed looks healthy 11/16; patient has completed the antibiotics still using gentamicin and silver alginate on the wound. There is improvement in the surface area 12/21; in general the area on the buttock looks somewhat better. Surface looks healthy although I do not know that there is been much improvement in the wound volume. We have been using silver alginate and Hydrofera Blue. Less drainage. In passing the husband showed me an abrasion injury on the left anterior tibia. Covered in necrotic surface. He has noticed this for about a week and has been putting silver collagen on it. He is completely uncertain about how this happened 1/25; monthly follow-up. The area on the left buttock is about the same. This does not go to bone but a fairly deep wound surface of the wound is of questionable viability. The abrasion injury that they showed me last time apparently was closed out by home health because they thought it was healed but certainly is not although it is just about healed. As a result they haven't been applying anything to this area Finally I did discuss with the patient and her husband the idea of an advanced treatment product to try and get a proper base to this wound I was thinking of Puraply however actually the patient points out that her co-pay for coming to visit [place] i.e. the facility, charge would be unaffordable if they have to, on a weekly basis 2/22; pressure ulcer on the left buttock appears deeper to me and abuts on the ischial tuberosity. I thought initially there was exposed bone but there is a rim of tissue over this area. She also has a superficial over the right anterior mid tibia. Been using silver collagen to these areas without much success. I have looked over the patient's past history with regards to the area on the left ischium. She did have underlying  osteomyelitis here dating back I think to late 2020. She saw Dr. Megan Salon she received a 6-week course of oral antibiotics in response to a bone culture that I did. This does not appear to be infected but it certainly has not been improving in terms of granulation. I do not believe she has had any recent imaging studies 3/29; 1 month follow-up. Pressure ulcer on the left buttock which she has  been dealing with with for a number of years. She was treated for underlying osteomyelitis at 1.2 or 3 years ago I think with infectious disease help. She also had I think a flap closure by Dr. Leland Johns and that lasted for about a year and then reopened. I have not been able to get this patient to progress towards healing although truthfully the wound is absolutely no worse. We have been using Hydrofera Blue 4/26; patient presents for 1 week follow-up. She has been using silver collagen to the area every other day. She has home health that comes out once a week to help with dressing changes as well. The patient is interested in trying a skin substitute over this area. She states she is trying to relieve pressure off of it most of the day. 5/24; patient presents for 1 month follow-up. She has been using silver collagen to the area every other day. She has no complaints today. She is interested in the skin substitute. She tries to leave relieve pressure off Her bottom however is not able to most of the day 6/14; using silver collagen to the area over the left ischial tuberosity. Wound does not appear to be doing particularly well. Open to bone 6/28; the patient patient presented last time with a marked deterioration. Depth probing all the way to bone. The bone itself did not look particularly viable. In spite of this the x-ray I did showed longstanding ulcer over the left ischial tuberosity with chronic bone involvement/reaction that was also seen by CT in 2020. Lab work did not show just active infection with a  sed rate of 14 and a C-reactive protein of 7.1. Her comprehensive metabolic panel was normal including an albumin of 4.1 white count was 6 7/19; patient was here 3 weeks ago with a marked deterioration in her wound over the left ischial tuberosity. I ordered a CT scan of the area for 1 reason or another this just did not get done. It is now booked for 8 days from now. She was supposed to come back for bone culture and pathology. That did not happen either. We have been using silver collagen. As usual she is diligently looked after by her husband 8/24; 5-week follow-up. Since the patient was last here the biopsy that I did of her ischial tuberosity came back suggesting osteomyelitis. Culture showed strep. I started her on Augmentin. She was seen by infectious disease Dr. Lucianne Lei dam and the Augmentin was continued and she is still taking it. CT scan did not suggest anything other than chronic osteomyelitis without much change from her previous study. Her C-reactive protein was only 7 and sedimentation rate was 1.1. We are still using silver collagen to the wound. She has home health. Her husband is very diligent in her care for this reason I have never pursued a diverting colostomy. She would not agree to this surgery in any case. Finally we have discussed plastic surgery with him in the past and she is not interested in a myocutaneous flap. 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 Since she was last here she was in the ER last week with abdominal pain. She was found to be impacted however in the course of the review there somebody gave her some IV morphine and apparently she developed hives and blisters. There is still tense blisters on her plantar left fifth and fourth fingers 10/4; the patient has completed her Augmentin and is due to follow-up with Dr. Drucilla Schmidt in early  November. Her wound today measures about the same but looks a little healthier in terms of granulation there is no exposed bone. I  note that she was admitted to hospital for 2 days from 9/21 through 9/23 for delirium. She had received Versed for glaucoma surgery ultimately that was felt to be the etiology. Her blood cultures were negative she had 30,000 colonies of Pseudomonas in her urine. She did receive broad-spectrum antibiotic therapy but ultimately her wound on the buttock was not felt to be the cause. As RAIZY, AUZENNE ([id number]) [id number] Page 6 of 18 mentioned she has completed her antibiotics still using silver collagen 11/1; patient has completed her antibiotics for the underlying osteomyelitis. She apparently follows up with Dr. Drucilla Schmidt next week. The wound does not look too bad perhaps slightly narrower in terms of width but the depth is about the same. We have been using Hydrofera Blue. The patient talks to me about a wound VAC and made it sound as though she was recently on 1 although I do not see this. The other option is an advanced treatment product like Oasis but that means weekly trips into the clinic. She has previously said she does not want an attempt at plastic surgery 11/15; the patient saw Dr. Drucilla Schmidt noted that her follow-up inflammatory markers normalized. She has completed her antibiotics. We are currently using Hydrofera Blue. Wound itself has some surface tissue over bone but certainly not a lot. I have looked back over her history. The patient has had recurrent osteomyelitis in this area as she is received prolonged courses of antibiotics. We did use a wound VAC for a prolonged period of time in 2021. We had some improvement especially in undermining areas but overall not a lot of measurable improvement. Once again I have tried to think about using advanced treatment products in this area something that would require them visiting the clinic very frequently and they did not seem to want to do this. Most recently we ran Carrizales however she would have a $829 per  application co-pay 93/7; left ischial tuberosity. I do not see much difference in 3 weeks. There is no exposed bone. We are using Hydrofera Blue. Our intake nurse reports some "greenish" drainage 12/21 left ischial tuberosity. Measuring slightly smaller in surface area. The wound still has some tissue over bone i.e. there is no exposed bone. No overt infection. We did a deep tissue culture last time for PCR. The major bacteria was Pseudomonas although there were medium titers of Enterococcus faecalis Klebsiella E. coli and Morganella as well as Peptostreptococcus. Keystone antibiotic include streptomycin and vancomycin they got this last weekend and used it twice 04/17/2021; no change in this wound. It does not have undermining however the surface is of it does not look particularly viable. We have been using topical antibiotic directed at a PCR culture as well as silver alginate. It is clear in talking to the patient and her husband that she does not offload this area adequately including spending all night on this with I think a level 2 surface on her bed. I have told her that this is not adequate to heal a wound like this.. 2/15; Oasis with a $169 co-pay per application. They are using silver alginate to the wound offloading as best they can according to her husband 06/19/2021: At her last visit, Dr. Dellia Nims changed her to silver collagen, but apparently they did not receive this until just last week. They have continued using silver alginate in  the wound. Today, the wound is quite malodorous with necrotic slough. They ran out of [place] topical about 2 months ago. No new cultures have been taken.. 07/17/2021: At the last visit, the wound was in pretty poor condition with a lot of necrotic slough and substantial malodorous drainage. I did take a new culture for PCR, but for some reason this was never resulted. Nonetheless, they did receive a renewal of their [employer] apparently it was communicated to  them that they should use silver alginate rather than the silver collagen. Regardless, the wound is much cleaner at this visit and there is no odor. 07/26/2021: The sacral/left ischial ulcer is fairly clean, but there was some greenish drainage appreciated on the dressing. They have been using topical Keystone with silver alginate. She has unfortunately developed a new ulcer on her right ischium. It is unstageable with a thick layer of eschar. It is malodorous. 08/14/2021: I took a new PCR culture at her last visit from the new wound that had opened on her right ischial tuberosity. This returned with a polymicrobial population. A new topical Keystone compound was formulated. They have been using this on her wounds along with silver alginate. I also prescribed a course of oral antibiotic, Augmentin and Levaquin to try and address the multiple species with various resistance these that grew out. She was able to take this for about 10 days, but it has started to make her nauseated and she is actually thrown up on occasion. Since she takes them at the same time, she is not sure which one is causing her issue. She has also been having a lot more drainage from her left ischial wound. Her husband says that he has not been using the zinc oxide as much because it is difficult to wash off when he bathes her. We have been using Santyl to the new wound under silver alginate and silver alginate to the left ischial wound. 09/11/2021: Unfortunately, there has been fairly substantial breakdown of both of her wounds. It sounds like the gel compound that the [employer] was being mixed in just leaked out everywhere and left the patient wet and macerated. There is worsening necrotic tissue and increased depth on the right ischial wound and the left one has extended further. They have not been using the zinc oxide as recommended. They contacted [facility] and were advised to simply apply the powder directly to the  wounds. 10/09/2021: The right ischial wound has some necrotic tissue on the lateral aspect and into the base, but no purulent drainage and no surrounding erythema or induration. On the left ischial wound, there is leathery nonviable skin extending laterally from the main wound. No significant odor or drainage from this site, either. 11/05/2021: The right ischial wound has more necrotic fibrinous tissue at the base. The leathery eschar on the left ischial wound is beginning to lift at one of the edges and may be amenable to debridement today. We have been using topical powdered mupirocin to her wounds with silver alginate. 12/03/2021: Significant improvement in both of the wounds. There is still some fibrinous slough on the right ischial wound. There is also a heavy layer of slough and fibrinous exudate on the left ischial wound as well as some soft light slough on the remainder of the wound surface. We have been using Santyl for enzymatic debridement between clinic visits. 12/31/2021: The right ischial wound is cleaner today with very minimal slough. The left ischial wound has an area of devitalized subcutaneous tissue  and the most lateral extension of the wound. There is slough and biofilm on the surface, but overall the wounds are looking significantly better. We have been unable to obtain Santyl and they have just been doing wet-to-dry dressing changes with good effect. 01/30/2022: The left ischial wound is very clean with just a little bit of slough on the posterolateral aspect of the wound. She has some pressure induced deep tissue injury on the right ischial wound, but otherwise both are quite clean. Electronic Signature(s) Signed: 01/30/2022 11:49:35 AM By: Fredirick Maudlin MD FACS Entered By: Fredirick Maudlin on 01/30/2022 11:49:35 Wollman, [name] ([id number]) [id number] Page 7 of  18 -------------------------------------------------------------------------------- Physical Exam Details Patient Name: Date of Service: DELOMA, SPINDLE 01/30/2022 10:30 A M Medical Record Number: [id number] Patient Account Number: [id number] Date of Birth/Sex: Treating RN: 1950/11/20 (71 y.o. F) Primary Care Provider: [name] Other Clinician: Referring Provider: Treating Provider/Extender: Burgess Amor Weeks in Treatment: 161 Constitutional . . . . No acute distress.Marland Kitchen Respiratory Normal work of breathing on room air.. Notes 01/30/2022: The left ischial wound is very clean with just a little bit of slough on the posterolateral aspect of the wound. She has some pressure induced deep tissue injury on the right ischial wound, but otherwise both are quite clean. Electronic Signature(s) Signed: 01/30/2022 11:50:29 AM By: Fredirick Maudlin MD FACS Entered By: Fredirick Maudlin on 01/30/2022 11:50:29 -------------------------------------------------------------------------------- Physician Orders Details Patient Name: Date of Service: [name]. 01/30/2022 10:30 A M Medical Record Number: [id number] Patient Account Number: [id number] Date of Birth/Sex: Treating RN: 10-05-1950 (71 y.o. 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 - Dr. Celine Ahr - Room 1 ****HOYER**** Anesthetic Wound #12 Left Ischial Tuberosity (In clinic) Topical Lidocaine 4% applied to wound bed Wound #15 Right Ischium (In clinic) Topical Lidocaine 4% applied to wound bed Bathing/ Shower/  Hygiene May shower and wash wound with soap and water. - with dressing changes Edema Control - Lymphedema / SCD / Other Elevate legs to the level of the heart or above for 30 minutes daily and/or when sitting, a frequency of: - throughout the day Off-Loading A fluidized (Group 3) mattress - Medical Modalities ir Turn and reposition every 2 hours - *** Try to shift from side to side and shift to belly (if tolerated),****avoid sitting up in bed except for meals Additional Orders / Instructions Follow Nutritious Diet - protein shakes 2-3 times per day, Juven 2 times per day Mendes ([id number]) [id number] Page 8 of 18 No change in wound care orders this week; continue Home Health for wound care. May utilize formulary equivalent dressing for wound treatment orders unless otherwise specified. Dressing changes to be completed by Home Health on Monday / Wednesday / Friday except when patient has scheduled visit at [facility]. - increase visits to 3 times per week (spouse having difficulty with dressing changes) Other Home Health Orders/Instructions: - [facility] Wound Treatment Wound #12 - Ischial Tuberosity Wound Laterality: Left Cleanser: Soap and Water 1 x Per Day/30 Days Discharge Instructions: May shower and wash wound with dial antibacterial soap and water prior to dressing change.  Cleanser: Wound Cleanser (Generic) 1 x Per Day/30 Days Discharge Instructions: Cleanse the wound with wound cleanser prior to applying a clean dressing using gauze sponges, not tissue or cotton balls. Peri-Wound Care: Skin Prep (Generic) 1 x Per Day/30 Days Discharge Instructions: Use skin prep as directed Prim Dressing: Medline Woven Gauze Sponges 4x4 (in/in) 1 x Per Day/30 Days ary Discharge Instructions: moisten with saline and pack lightly into wound Secondary Dressing: Zetuvit Plus Silicone Border Dressing 5x5 (in/in) (Generic) 1 x Per Day/30  Days Discharge Instructions: Apply silicone border or ABD padover primary dressing as directed. Wound #15 - Ischium Wound Laterality: Right Cleanser: Soap and Water 1 x Per Day/30 Days Discharge Instructions: May shower and wash wound with dial antibacterial soap and water prior to dressing change. Cleanser: Wound Cleanser (Generic) 1 x Per Day/30 Days Discharge Instructions: Cleanse the wound with wound cleanser prior to applying a clean dressing using gauze sponges, not tissue or cotton balls. Peri-Wound Care: Skin Prep (Generic) 1 x Per Day/30 Days Discharge Instructions: Use skin prep as directed Prim Dressing: Medline Woven Gauze Sponges 4x4 (in/in) 1 x Per Day/30 Days ary Discharge Instructions: moisten with saline and pack lightly into wound Secondary Dressing: Zetuvit Plus Silicone Border Dressing 5x5 (in/in) (Generic) 1 x Per Day/30 Days Discharge Instructions: Apply silicone border or ABD padover primary dressing as directed. Electronic Signature(s) Signed: 01/30/2022 11:53:22 AM By: Fredirick Maudlin MD FACS Entered By: Fredirick Maudlin on 01/30/2022 11:50:51 -------------------------------------------------------------------------------- Problem List Details Patient Name: Date of Service: [name]. 01/30/2022 10:30 A M Medical Record Number: [id number] Patient Account Number: [id number] Date of Birth/Sex: Treating RN: 08/09/1950 (71 y.o. 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, stage 4 12/30/2018 No Yes E11.622 Type 2 diabetes mellitus with other skin ulcer 12/30/2018 No Yes L89.313 Pressure ulcer of right buttock, stage 3 07/26/2021 No Yes Weigel, [name] ([id number]) [id number] Page 9 of 18 Inactive  Problems ICD-10 Code Description Active Date Inactive Date G82.20 Paraplegia, unspecified 12/30/2018 12/30/2018 M86.68 Other chronic osteomyelitis, other site 02/17/2019 02/17/2019 S80.811D Abrasion, right lower leg, subsequent encounter 03/27/2020 03/27/2020 M86.68 Other chronic osteomyelitis, other site 11/28/2020 11/28/2020 L97.811 Non-pressure chronic ulcer of other part of right lower leg limited to breakdown of skin 03/27/2020 03/27/2020 Resolved Problems Electronic Signature(s) Signed: 01/30/2022 11:46:56 AM By: Fredirick Maudlin MD FACS Entered By: Fredirick Maudlin on 01/30/2022 11:46:56 -------------------------------------------------------------------------------- Progress Note Details Patient Name: Date of Service: [name]. 01/30/2022 10:30 A M Medical Record Number: [id number] Patient Account Number: [id number] Date of Birth/Sex: Treating RN: 02/09/1951 (71 y.o. F) Primary Care Provider: [name] Other Clinician: Referring Provider: Treating Provider/Extender: Nance Pew in Treatment: 161 Subjective Chief Complaint Information obtained from Patient She is here for follow up evaluation of left ischial pressure ulcer 12/30/2018; patient comes back for review of wounds in the same general area over the previously healed left ischial pressure ulcer History of Present Illness (HPI) The following HPI elements were documented for the patient's wound: Location: open ulceration of the left gluteal area, left heel and right ankle for about 5 months. Quality: Patient reports No Pain. Severity: Patient states wound(s) are getting worse. Duration: Patient has had the wound for > 5 months prior to seeking treatment at the wound center Context: The wound occurred when the patient has been  paraplegic for about 3 years. Modifying Factors: Wound improving due to current treatment. Associated Signs and Symptoms: Patient reports having foul odor. this  71 year old patient who is known to have hypertension, hypothyroidism, breast cancer, chronic pain syndrome, paraplegia was noted to have a left gluteal decubitus ulcer and was brought into the hospital. During the course of her hospitalization she was debrided in the operating room by ankle wound. Bone cultures were taken at that time but were negative but clinically she was treated for osteomyelitis because of the probing down to bone and open exposed bone. Home health has been giving her antibioticss which include vancomycin and Zosyn. The patient was a smoker until about 3 weeks ago and used to smoke about 10 cigarettes a day for a long while. 12/13/2014 - details of her operative note from 11/03/2014 were reviewed -- PROCEDURE: 1. Excisional debridement skin, subcutaneous, muscle left ischium 35 cm2 2. Excisional debridement skin, subcutaneous tissue left heel 27 cm2 3. Excisional debridement right ankle skin, subcutaneous, bone 30 cm2 01/24/2015 -- she has some issues with her wheelchair cushion but other than that is doing very well and has received Podus boots for her feet. 02/14/2015 -- she was using her old offloading boots and this seemed to have caused her a new pressure ulcer on the left posterior heel near the superior part just below the Achilles tendon. LAMICA, MCCART ([id number]) [id number] Page 10 of 18 03/07/2015 -- she has a new ulceration just to the left of the midline on her sacral region more on the left buttock and this has been there for Dr. Leland Johns and had all the wounds sharply debrided. The debridement was done for the left ischial wound, the left heel wound and the right about a week. 08/22/2015 -- was recently admitted to hospital between May 5 and 08/13/2015, with sepsis and leukocytosis due to a UTI. she was treated for a sepsis complicating Escherichia coli UTI and kidney stones. She also had metabolic and careful up at the secondary to  pyelonephritis. He received broad-spectrum antibiotics initially and then received Macrobid as per urology. She was sent home on nitrofurantoin. during her admission she had a CT scan which showed exposed left ischial tuberosity without evidence of osteolysis. 09/12/2015-- the patient is having some issues with her air mattress and would like to get a opinion from medical modalities. 10/10/2015 -- the issue with her air mattress has not yet been sorted out and the new problem seems to be a lot of odor from the wound VAC. 11/27/2015 -- the patient was admitted to the hospital between July 23 and 10/31/2015. Her problems were sepsis, osteomyelitis of the pelvic bone and acute pyelonephritis. CT of the abdomen and pelvis was consistent with a left-sided pyelonephritis with hydronephrosis and also just showed new sclerosis of the posterior portion of the left anterior pubic ramus suggestive of periosteal reaction consistent with osteomyelitis. She was treated for the osteomyelitis with infectious disease consult recommending 6 weeks of IV antibiotics including vancomycin and Rocephin and the antibiotics were to go on until 12/10/2015. He was seen by Dr. Iran Planas plastic surgery and Dr. Linus Salmons of infectious disease. She had a suprapubic catheter placed during the admission. CT scan done on 10/28/2015 showed specifically -- New sclerosis of the posterior portion of the left inferior pubic ramus with aggressive periosteal reaction, consistent with osteomyelitis, with adjacent soft tissue gas compatible with previously described decubitus ulcer. 12/12/2015 -- she was recently seen by Dr. Linus Salmons, who noted good improvement  and CRP and ESR compared to before and he has stopped her antibiotic as per plans to finish on September 4. The patient was encouraged to continue with wound care and consider hyperbaric oxygen therapy. Today she tells me that she has consented to undergo hyperbaric oxygen therapy and we can  start the paperwork. 01/02/2016 -- her PCP had gained about 3 years but she still persists in having problems during hyperbaric oxygen therapy with some discomfort in the ears. 01/09/16; pressure area with underlying osteomyelitis in the left buttock. Wound bed itself has some slight amount of grayish surface slough however I do not think any debridement was necessary. There is no exposed bone soft tissue appears stable. She is using a wound VAC 01/16/16; back for weekly wound review in conjunction with HBO. She has a deep wound over the left initial tuberosity previously treated with 6 weeks of IV antibiotics for osteomyelitis. Wound bed looks reasonably healthy although the base of this is still precariously close to bone. She has been using a wound VAC. 01/23/2016 -- she has completed her course of antibiotics and this week the only new thing is her right great toe nail was avulsed and she has got an open wound over the nailbed. 01/31/16 she has completed her course of antibiotics. Her right great toenail avulsed last week and she's been using silver alginate for this as well. 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 Did not sound like there was any optimistic discussion regarding reconstruction and the net result would be to continue with the wound VAC application. I will await the official reports. Addendum: -- she was seen at [place] surgery service by Dr. Tressa Busman. After a thorough review and from what I understand spending 45 minutes with the patient his assessment has been noted by me in detail and the management options were: 1. Continued pressure offloading and wound care versus operative procedures including wound excision 2.  Soft tissue and bone sampling 3. If the wound gets larger wound closure would be done using a variety of plastic surgical techniques including but not limited to skin substitute, possible skin graft, local versus regional flaps, negative pressure dressing application. 4. He discussed with her details of flap surgery and the risks associated 5. He made a comment that since the patient was operated on by Dr. Leland Johns of [facility] plastic surgery unit in [place] the patient may continue to follow-up there for further evaluation for surgical flap closure in the future. 03/19/2016 -- the patient continues to be rather depressed and frustrated with her lack of rapid progress in healing this wound especially because she thought after hyperbaric oxygen therapy the wound would heal extremely fast. She now understands that was not the implied benefit on wound care which was the recommendation for hyperbaric oxygen therapy. I have had a lengthy discussion with the patient and her husband regarding her options: 1. Continue with collagen and wound VAC for the primary dressing and offloading and all supportive care. 2. See Dr. Iran Planas for possible placement of Acell or Integra in the OR. 3. get a second opinion from a wound care center and surrounding regions/counties 05/07/2016 -- Note from Dr. Celedonio Miyamoto, who noted that the patient  has declined flap surgery. She has discussed application of A cell, and try a few applications to see how the wound progresses. She is also recommended that we could apply products here in the wound center, like Oasis. during her preop workup it was found that her hemoglobin A1c was 11% and she has now been diagnosed as having diabetes mellitus and has been put on appropriate treatment by her PCP 05/28/2016 -- tells me her blood sugars have been doing well and she has an appointment to see her PCP in the next couple of weeks to check her hemoglobin A1c. Other  than that she continues to do well. 06/25/2016 -- have not seen her back for the last month but she says her health has been about the same and she has an appointment to check the A1c next week 09/10/16 ---- was seen by Dr. Celedonio Miyamoto -- who applied Acell and saw her back in follow-up. She has recommended silver alginate to the wound every other day and cover with foam. If no significant drainage could transition to collagen every other day. She recommended discontinuing wound VAC. There were no plans to repeat application of Acell. The patient expressed that her husband could do the wound care as going to the Wound Ctr., would cost several $100 for each visit. 10/21/2016 -- her insurance company is getting her new mattress and she is pleased about that. Other than that she has been doing dressings with PolyMem Silver and has been doing very well 02/18/2017 -- she has gone through several changes of her mattress and has not been pleased with any of them. The ventricles are still working on trying to fit her with the appropriate low air-loss mattress. She has a new wound on the gluteal area which is clearly separated from the original wound. 03/25/17-she is here in follow-up evaluation for her left ischialpressure ulcer. She remains unsatisfied with her pressure mattress. She admits to sitting multiple hours a day, in the bed. We have discussed offloading options. The wound does not appear infected. Nutrition does not appear to be a concern. Will follow-up in 4 weeks, if wound continues to be stalled may consider x-ray to evaluate for refractory osteomyelitis. 04/21/17; this is a patient that I don't know all that well. She has a chronic wound which at one point had underlying osteomyelitis in the left ischial tuberosity. This is a stage IV pressure ulcer. Over the last 3 months she has a stage II wound inferiorly to the original wound. The last time she was here her dressing was changed to silver  collagen although the patient's husband who changes the dressing said that the collagen stuck to the wound and remove skin from the superficial area therefore he switched back to [place] 05/13/17; this is a patient we've been following for a left ischial tuberosity wound which was stage IV at one point had underlying osteomyelitis. Over the last several months she's had a stage II wound just inferior and medial to the related to the wound. According to her husband he is using Endoform layer with collagen although this is not what I had last time. According to her husband they are using Elgie [place] with collagen although I don't quite know how that started. She was hospitalized from 1/20 through 04/30/16. This was related to a UTI. Her blood cultures were negative, urine culture showed multiple species. She did have a CT scan of the abdomen and pelvis which documented chronic osteomyelitis in the area of  the wound inflammatory markers were unremarkable. She has had prior knowledge of osteomyelitis. It looks as though she received IV antibiotics in 2017 and was treated with a course of hyperbaric oxygen. 05/28/17; the wound over the left ischial tuberosity is deeper today and abuts clearly on bone. Nursing intake reported drainage. I therefore culture of the wound. The more superficial area just below this looks about the same. They once again complained that there are mattress cover is not working although apparently [facility] is been noted to see this many times in the report is that the device is functional 06/18/17; the patient had a probing area on the left ischial tuberosity that was draining purulent fluid last time. This also clearly seemed to have open bone. Culture I did showed pansensitive pseudomonas including third generation cephalosporins. I treated this with cefdinir 300 twice a day for 10 days and things MARKIAH, JANEWAY ([id number]) [id number] Page  11 of 18 seem to have improved. She has a more superficial wound just underneath this area. Amazingly she has a new air mattress through [facility]. I think they gave this to her as a parking give. In any case this now works according to the patient may have something to do with why the areas are looking better. 07/09/17; the patient has a probing area in the left ischial tuberosity that still has some depth. However this is contracted in terms of the wound orifice although the depth is still roughly the same. There is no undermining. ooShe also has the satellite wound which is more superficial. This appears to have a healthy surface we've been using silver collagen 08/06/17; the patient's wound is over the left ischial tuberosity and a satellite lesion just underneath this. The original wound was actually a deep stage 4 wound. We have made good progress in 2 months and there is no longer exposed bone here. 09/03/17; left ischial tuberosity actually appears to be quite healthy. I think we are making progress. No debridement is required. There is no surrounding erythema 10/01/17 I follow this patient monthly for her left ischial tuberosity wound. There is 2 areas the original area and a satellite area. The satellite area looks a lot better there is no surrounding erythema. Her husband relates that he is having trouble maintaining the dressing. This has to do with the soft tissue around it. He states he puts the collagen in but he cannot make sure that it stays in even with the ABD pads and tape that he is been using 10/29/17; patient arrives with a better looking noon today. Some of the satellite lesions have closed. using Prisma 11/26/17; the patient has a large cone-shaped area with the tip of the Cone deep within her buttock soft tissue. The walls of the Cone are epithelialized however the base is still open. The area at the base of this looks moist we've been using silver collagen. Will change  to silver alginate 12/31/2017; the wound appears to have come in fairly nicely. Using silver alginate. There is no surrounding maceration or infection 01/28/18; there is still an open area here over the left initial tuberosity. Base of this however looks healthy. There is no surrounding infection 02/25/18; the area of its open is over the left ischial tuberosity. The base of this is where the wound is. This is a large inverted cone-shaped area with the wound at the tip. Dimensions of the wound at the tip are improved. There is a area of denuded skin about  halfway towards the tip which her husband thinks may have happened today when he was bathing her. 04/20/17; the area is still open over the left initial tuberosity. 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 This is now completely closed over although it is closed over with a divot and skin over bone at the base of this. Continued aggressive offloading will be necessary. 12/30/2018 READMISSION This is a 71 year old woman with chronic paraplegia. I picked her up for her care from Dr. Con Memos in this clinic after he departed. She had a stage IV pressure wound over the left ischial tuberosity. She was treated twice for her underlying osteomyelitis and this I believe firstly in 2016 and again in 2017. There were some plans at some point for flap closure of this however she was discovered to have uncontrolled diabetes and I do not think this was ever accomplished. She ultimately healed over in this clinic and was discharged in May. She has a large cone-shaped indentation with the tip of this going towards the left ischial tuberosity. It is not an easy area to examine but at that time I thought all of this was epithelialized. Apparently there was a reopening here  shortly after she left the clinic last time. She was admitted to hospital at the end of June for Klebsiella bacteremia felt to be secondary to UTI. A CT scan of the pelvis is listed below and there was initially some concern that she had underlying osteomyelitis although I believe she was seen by infectious disease and that was felt to be not the case: I do not see any new cultures or inflammatory markers IMPRESSION: 1. No CT evidence for acute intra-abdominal or pelvic abnormality. Large volume of stool throughout the colon. 2. Enlarged fatty liver with fat sparing near the gallbladder fossa 3. Cortical scarring right kidney. Bilateral intrarenal stones without hydronephrosis. Thick-walled urinary bladder decompressed by suprapubic catheter 4. Deep left decubitus ulcer with underlying left ischial changes suggesting osteomyelitis. Her husband has been using silver collagen in the wound. She has not been systemically unwell no fever chills eating and drinking well. They rigorously offload this wound only getting up in the wheelchair when she is going to appointments the rest of the time she is in bed. 10/8; wound measures larger and she now has exposed bone. We have been using silver alginate 11/12 still using silver alginate. The patient saw Dr. Megan Salon of infectious disease. She was started on Augmentin 500 mg twice daily. She is due to follow-up with Dr. Megan Salon I believe next week. Lab work Dr. Megan Salon requested showed a sedimentation rate of 28 and CRP of 20 although her CRP 1 year ago was 18.8. Sedimentation rate 1 year ago was 11 basic metabolic panel showed a creatinine of 1.12 12/3; the patient followed up with Dr. Megan Salon yesterday. She is still on Augmentin twice daily. This was directed by Dr. Megan Salon. The patient's inflammatory markers have improved which is gratifying. Her C-reactive protein was repeated yesterday and follow-up booked with infectious disease in January. In  addition I have been getting secure text messages I think from palliative care [facility] [employer]. I think they were hoping to provide services to the patient in her home. They could not get a hold of the primary  physician and so they reached out to me on 2 separate occasions. 12/17; patient last saw Dr. Megan Salon on 12/2. She is finishing up with Augmentin. Her C-reactive protein was 20 on 10/21, 10.1 on 11/19 and 17 on 12/2. The wound itself still has depth and undermining. We are using Santyl with the backing wet-to-dry 04/27/2019. The wound is gradually clearing up in terms of the surface although it is not filled in that much. Still abuts right against bone 2/4; patient with a deep pressure ulcer over the left ischial tuberosity. I thought she was going to follow-up with infectious disease to follow her inflammatory markers although the patient states that they stated that they did not need to see her unless we felt it was necessary. I will need to check their notes. In any case we ordered moistened silver collagen back with wet-to-dry to fill in the depth of the wound although apparently [facility] sent silver alginate which they have been using since they were here the last time. Is obviously not what we ordered. 2/25. Not much change in this wound it is over the left ischial tuberosity recurrent wound. We have been using silver collagen with backing wet-to-dry. I think the wound is about the same. There is still some tunneling from about 10-12 o'clock over the ischial tuberosity itself 3/11; pressure ulcer over the left ischial tuberosity. Since she was last here the wound VAC was started and apparently going quite well. We are able to get the home health company that accepts [place] healthcare which is in itself sometimes problematic. There is been improvements in the wound the tunneling seems to be better and is contracted nicely 4/8; 1 month follow-up. Since she was last  here we have been using silver collagen under a wound VAC. Some minor contraction I think in wound volume. She is cared for diligently by her husband including pressure relief, incontinence management, nutritional support etc. 6/1; this is almost a 58-month follow-up. She is been using silver collagen under wound VAC. Circular area over the left ischial tuberosity. She has been using silver collagen under wound VAC 7/8; 1 month follow-up. Silver collagen under the VAC not really a lot of progress. Tissue at the base of the wound which is right against bone and the tissue next that this does not look completely viable. She is not currently on any antibiotics, she had underlying osteomyelitis I need to look this over 8/16; we are using silver collagen under wound VAC to the left ischial tuberosity wound. Comes in today with absolutely no change in surface area or depth. There is no exposed bone. I did look over her infectious disease notes as I said I would do last time. She last saw Dr. Megan Salon in December 2020. She completed 6 weeks of Augmentin. This was in response to a bone culture I did showing methicillin susceptible staph aureus and Enterococcus. She was supposed to come back to see Dr. Megan Salon at some point although they say that that appointment was canceled unless I chose to recommend return. I think there was supposed to be follow-up with inflammatory markers but I cannot see that that was ever done. She has not been on antibiotics since 9/21; monthly follow-up. We received a call from home health nurse last evening to report green drainage coming out of the wound. Lab work I ordered last time showed a white count of 5.2 a sedimentation rate of 45 and a C-reactive protein of 25 however neither one of the 2  values are substantially different from her previous values in October 2020 or December 2020. Both are slightly higher but only marginally. Otherwise no new complaints from the patient or  her husband 10/19; 1 month follow-up. PCR culture I did last time showed medium quantities of Pseudomonas lower quantities Klebsiella and Enterococcus faecalis group B strep and Peptostreptococcus. I gave her Augmentin for 2 weeks. I am not really sure of my choice of this I would not cover Pseudomonas. She is still having Hinckley, [name] ([id number]) [id number] Page 12 of 18 green drainage. Wound itself looks satisfactory there is not a lot of depth wound bed looks healthy 11/16; patient has completed the antibiotics still using gentamicin and silver alginate on the wound. There is improvement in the surface area 12/21; in general the area on the buttock looks somewhat better. Surface looks healthy although I do not know that there is been much improvement in the wound volume. We have been using silver alginate and Hydrofera Blue. Less drainage. In passing the husband showed me an abrasion injury on the left anterior tibia. Covered in necrotic surface. He has noticed this for about a week and has been putting silver collagen on it. He is completely uncertain about how this happened 1/25; monthly follow-up. The area on the left buttock is about the same. This does not go to bone but a fairly deep wound surface of the wound is of questionable viability. The abrasion injury that they showed me last time apparently was closed out by home health because they thought it was healed but certainly is not although it is just about healed. As a result they haven't been applying anything to this area Finally I did discuss with the patient and her husband the idea of an advanced treatment product to try and get a proper base to this wound I was thinking of Puraply however actually the patient points out that her co-pay for coming to visit [place] i.e. the facility, charge would be unaffordable if they have to, on a weekly basis 2/22; pressure ulcer on the left buttock appears deeper  to me and abuts on the ischial tuberosity. I thought initially there was exposed bone but there is a rim of tissue over this area. She also has a superficial over the right anterior mid tibia. Been using silver collagen to these areas without much success. I have looked over the patient's past history with regards to the area on the left ischium. She did have underlying osteomyelitis here dating back I think to late 2020. She saw Dr. Megan Salon she received a 6-week course of oral antibiotics in response to a bone culture that I did. This does not appear to be infected but it certainly has not been improving in terms of granulation. I do not believe she has had any recent imaging studies 3/29; 1 month follow-up. Pressure ulcer on the left buttock which she has been dealing with with for a number of years. She was treated for underlying osteomyelitis at 1.2 or 3 years ago I think with infectious disease help. She also had I think a flap closure by Dr. Leland Johns and that lasted for about a year and then reopened. I have not been able to get this patient to progress towards healing although truthfully the wound is absolutely no worse. We have been using Hydrofera Blue 4/26; patient presents for 1 week follow-up. She has been using silver collagen to the area every other day. She has home health that comes out  once a week to help with dressing changes as well. The patient is interested in trying a skin substitute over this area. She states she is trying to relieve pressure off of it most of the day. 5/24; patient presents for 1 month follow-up. She has been using silver collagen to the area every other day. She has no complaints today. She is interested in the skin substitute. She tries to leave relieve pressure off Her bottom however is not able to most of the day 6/14; using silver collagen to the area over the left ischial tuberosity. Wound does not appear to be doing particularly well. Open to bone 6/28;  the patient patient presented last time with a marked deterioration. Depth probing all the way to bone. The bone itself did not look particularly viable. In spite of this the x-ray I did showed longstanding ulcer over the left ischial tuberosity with chronic bone involvement/reaction that was also seen by CT in 2020. Lab work did not show just active infection with a sed rate of 14 and a C-reactive protein of 7.1. Her comprehensive metabolic panel was normal including an albumin of 4.1 white count was 6 7/19; patient was here 3 weeks ago with a marked deterioration in her wound over the left ischial tuberosity. I ordered a CT scan of the area for 1 reason or another this just did not get done. It is now booked for 8 days from now. She was supposed to come back for bone culture and pathology. That did not happen either. We have been using silver collagen. As usual she is diligently looked after by her husband 8/24; 5-week follow-up. Since the patient was last here the biopsy that I did of her ischial tuberosity came back suggesting osteomyelitis. Culture showed strep. I started her on Augmentin. She was seen by infectious disease Dr. Lucianne Lei dam and the Augmentin was continued and she is still taking it. CT scan did not suggest anything other than chronic osteomyelitis without much change from her previous study. Her C-reactive protein was only 7 and sedimentation rate was 1.1. We are still using silver collagen to the wound. She has home health. Her husband is very diligent in her care for this reason I have never pursued a diverting colostomy. She would not agree to this surgery in any case. Finally we have discussed plastic surgery with him in the past and she is not interested in a myocutaneous flap. 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 Since she was last here she was in the ER last week with abdominal pain. She was found to be impacted however in the course of the review there somebody gave her  some IV morphine and apparently she developed hives and blisters. There is still tense blisters on her plantar left fifth and fourth fingers 10/4; the patient has completed her Augmentin and is due to follow-up with Dr. Drucilla Schmidt in early November. Her wound today measures about the same but looks a little healthier in terms of granulation there is no exposed bone. I note that she was admitted to hospital for 2 days from 9/21 through 9/23 for delirium. She had received Versed for glaucoma surgery ultimately that was felt to be the etiology. Her blood cultures were negative she had 30,000 colonies of Pseudomonas in her urine. She did receive broad-spectrum antibiotic therapy but ultimately her wound on the buttock was not felt to be the cause. As mentioned she has completed her antibiotics still using silver collagen 11/1;  patient has completed her antibiotics for the underlying osteomyelitis. She apparently follows up with Dr. Drucilla Schmidt next week. The wound does not look too bad perhaps slightly narrower in terms of width but the depth is about the same. We have been using Hydrofera Blue. The patient talks to me about a wound VAC and made it sound as though she was recently on 1 although I do not see this. The other option is an advanced treatment product like Oasis but that means weekly trips into the clinic. She has previously said she does not want an attempt at plastic surgery 11/15; the patient saw Dr. Drucilla Schmidt noted that her follow-up inflammatory markers normalized. She has completed her antibiotics. We are currently using Hydrofera Blue. Wound itself has some surface tissue over bone but certainly not a lot. I have looked back over her history. The patient has had recurrent osteomyelitis in this area as she is received prolonged courses of antibiotics. We did use a wound VAC for a prolonged period of time in 2021. We had some improvement especially in undermining areas but overall not a lot of  measurable improvement. Once again I have tried to think about using advanced treatment products in this area something that would require them visiting the clinic very frequently and they did not seem to want to do this. Most recently we ran [place] however she would have a $720 per application co-pay 94/7; left ischial tuberosity. I do not see much difference in 3 weeks. There is no exposed bone. We are using Hydrofera Blue. Our intake nurse reports some "greenish" drainage 12/21 left ischial tuberosity. Measuring slightly smaller in surface area. The wound still has some tissue over bone i.e. there is no exposed bone. No overt infection. We did a deep tissue culture last time for PCR. The major bacteria was Pseudomonas although there were medium titers of Enterococcus faecalis Klebsiella E. coli and Morganella as well as Peptostreptococcus. Keystone antibiotic include streptomycin and vancomycin they got this last weekend and used it twice 04/17/2021; no change in this wound. It does not have undermining however the surface is of it does not look particularly viable. We have been using topical antibiotic directed at a PCR culture as well as silver alginate. It is clear in talking to the patient and her husband that she does not offload this area adequately including spending all night on this with I think a level 2 surface on her bed. I have told her that this is not adequate to heal a wound like this.. 2/15; Oasis with a $096 co-pay per application. They are using silver alginate to the wound offloading as best they can according to her husband [name], [name] ([id number]) [id number] Page 13 of 18 06/19/2021: At her last visit, Dr. Dellia Nims changed her to silver collagen, but apparently they did not receive this until just last week. They have continued using silver alginate in the wound. Today, the wound is quite malodorous with necrotic slough. They ran out of [place]  topical about 2 months ago. No new cultures have been taken.. 07/17/2021: At the last visit, the wound was in pretty poor condition with a lot of necrotic slough and substantial malodorous drainage. I did take a new culture for PCR, but for some reason this was never resulted. Nonetheless, they did receive a renewal of their [employer] apparently it was communicated to them that they should use silver alginate rather than the silver collagen. Regardless, the wound is much cleaner at this visit  and there is no odor. 07/26/2021: The sacral/left ischial ulcer is fairly clean, but there was some greenish drainage appreciated on the dressing. They have been using topical Keystone with silver alginate. She has unfortunately developed a new ulcer on her right ischium. It is unstageable with a thick layer of eschar. It is malodorous. 08/14/2021: I took a new PCR culture at her last visit from the new wound that had opened on her right ischial tuberosity. This returned with a polymicrobial population. A new topical Keystone compound was formulated. They have been using this on her wounds along with silver alginate. I also prescribed a course of oral antibiotic, Augmentin and Levaquin to try and address the multiple species with various resistance these that grew out. She was able to take this for about 10 days, but it has started to make her nauseated and she is actually thrown up on occasion. Since she takes them at the same time, she is not sure which one is causing her issue. She has also been having a lot more drainage from her left ischial wound. Her husband says that he has not been using the zinc oxide as much because it is difficult to wash off when he bathes her. We have been using Santyl to the new wound under silver alginate and silver alginate to the left ischial wound. 09/11/2021: Unfortunately, there has been fairly substantial breakdown of both of her wounds. It sounds like the gel compound that the  [employer] was being mixed in just leaked out everywhere and left the patient wet and macerated. There is worsening necrotic tissue and increased depth on the right ischial wound and the left one has extended further. They have not been using the zinc oxide as recommended. They contacted [facility] and were advised to simply apply the powder directly to the wounds. 10/09/2021: The right ischial wound has some necrotic tissue on the lateral aspect and into the base, but no purulent drainage and no surrounding erythema or induration. On the left ischial wound, there is leathery nonviable skin extending laterally from the main wound. No significant odor or drainage from this site, either. 11/05/2021: The right ischial wound has more necrotic fibrinous tissue at the base. The leathery eschar on the left ischial wound is beginning to lift at one of the edges and may be amenable to debridement today. We have been using topical powdered mupirocin to her wounds with silver alginate. 12/03/2021: Significant improvement in both of the wounds. There is still some fibrinous slough on the right ischial wound. There is also a heavy layer of slough and fibrinous exudate on the left ischial wound as well as some soft light slough on the remainder of the wound surface. We have been using Santyl for enzymatic debridement between clinic visits. 12/31/2021: The right ischial wound is cleaner today with very minimal slough. The left ischial wound has an area of devitalized subcutaneous tissue and the most lateral extension of the wound. There is slough and biofilm on the surface, but overall the wounds are looking significantly better. We have been unable to obtain Santyl and they have just been doing wet-to-dry dressing changes with good effect. 01/30/2022: The left ischial wound is very clean with just a little bit of slough on the posterolateral aspect of the wound. She has some pressure induced deep tissue injury on the right  ischial wound, but otherwise both are quite clean. Patient History Information obtained from Patient. Family History Unknown History, Cancer - Mother, Diabetes - Maternal  Grandparents, Heart Disease - Mother, Hypertension - Mother, Thyroid Problems - Mother, No family history of Hereditary Spherocytosis, Kidney Disease, Lung Disease, Seizures, Stroke, Tuberculosis. Social History Former smoker - ended on 11/06/2014, Marital Status - Married, Alcohol Use - Never, Drug Use - No History, Caffeine Use - Daily - T soda. ea, Medical History Eyes Denies history of Cataracts, Glaucoma, Optic Neuritis Ear/Nose/Mouth/Throat Denies history of Chronic sinus problems/congestion, Middle ear problems Hematologic/Lymphatic Patient has history of Anemia Denies history of Hemophilia, Human Immunodeficiency Virus, Lymphedema, Sickle Cell Disease Respiratory Denies history of Aspiration, Asthma, Chronic Obstructive Pulmonary Disease (COPD), Pneumothorax, Sleep Apnea, Tuberculosis Cardiovascular Patient has history of Hypertension Denies history of Angina, Arrhythmia, Congestive Heart Failure, Coronary Artery Disease, Deep Vein Thrombosis, Hypotension, Myocardial Infarction, Peripheral Arterial Disease, Peripheral Venous Disease, Phlebitis, Vasculitis Gastrointestinal Denies history of Cirrhosis , Colitis, Crohnoos, Hepatitis A, Hepatitis B, Hepatitis C Endocrine Patient has history of Type II Diabetes Denies history of Type I Diabetes Genitourinary Denies history of End Stage Renal Disease Immunological Denies history of Lupus Erythematosus, Raynaudoos, Scleroderma Integumentary (Skin) Denies history of History of Burn Musculoskeletal Patient has history of Osteoarthritis Denies history of Gout, Rheumatoid Arthritis, Osteomyelitis Neurologic Patient has history of Dementia, Paraplegia Denies history of Neuropathy, Quadriplegia Oncologic Patient has history of Received Radiation - 2012 - right  breast Psychiatric Denies history of Anorexia/bulimia, Confinement Anxiety Hospitalization/Surgery History - left knee replacement. - right breast lumpectomy. - cervical laminectomy with fusion. - hysterectomy. - tear duct surgery. - T and A. - UTI. - suprapubic cath placement. - UTI. BELYNDA, PAGADUAN ([id number]) [id number] Page 14 of 18 Medical A Surgical History Notes nd Gastrointestinal constipation GERD Genitourinary UTI kidney stones neurogenic bladder Integumentary (Skin) left gluteal fold sacral Musculoskeletal cervical neuropathy osteomyelitis Psychiatric admitted for suicide risk on 12/02/2018 Objective Constitutional No acute distress.. Vitals Time Taken: 10:44 AM, Height: 63 in, Weight: 185 lbs, BMI: 32.8, Temperature: 97.6 F, Pulse: 75 bpm, Respiratory Rate: 18 breaths/min, Blood Pressure: 101/59 mmHg. Respiratory Normal work of breathing on room air.. General Notes: 01/30/2022: The left ischial wound is very clean with just a little bit of slough on the posterolateral aspect of the wound. She has some pressure induced deep tissue injury on the right ischial wound, but otherwise both are quite clean. Integumentary (Hair, Skin) Wound #12 status is Open. Original cause of wound was Pressure Injury. The date acquired was: 09/06/2018. The wound has been in treatment 161 weeks. The wound is located on the Left Ischial Tuberosity. The wound measures 4.2cm length x 7.3cm width x 3.1cm depth; 24.08cm^2 area and 74.649cm^3 volume. There is Fat Layer (Subcutaneous Tissue) exposed. There is no tunneling noted, however, there is undermining starting at 3:00 and ending at 4:00 with a maximum distance of 2cm. There is a medium amount of serosanguineous drainage noted. The wound margin is well defined and not attached to the wound base. There is large (67-100%) red, pink granulation within the wound bed. There is a small (1-33%) amount of necrotic tissue  within the wound bed including Adherent Slough. The periwound skin appearance exhibited: Scarring, Dry/Scaly. The periwound skin appearance did not exhibit: Callus, Crepitus, Excoriation, Induration, Rash, Maceration, Atrophie Blanche, Cyanosis, Ecchymosis, Hemosiderin Staining, Mottled, Pallor, Rubor, Erythema. Periwound temperature was noted as No Abnormality. The periwound has tenderness on palpation. Wound #15 status is Open. Original cause of wound was Pressure Injury. The date acquired was: 07/21/2021. The wound has been in treatment 26 weeks. The wound is located on the Right Ischium. The  wound measures 1.5cm length x 1.7cm width x 2.2cm depth; 2.003cm^2 area and 4.406cm^3 volume. There is Fat Layer (Subcutaneous Tissue) exposed. There is no tunneling or undermining noted. There is a medium amount of serosanguineous drainage noted. The wound margin is distinct with the outline attached to the wound base. There is large (67-100%) red granulation within the wound bed. There is a small (1-33%) amount of necrotic tissue within the wound bed including Adherent Slough. The periwound skin appearance exhibited: Scarring, Dry/Scaly. The periwound skin appearance did not exhibit: Callus, Crepitus, Excoriation, Induration, Rash, Maceration, Atrophie Blanche, Cyanosis, Ecchymosis, Hemosiderin Staining, Mottled, Pallor, Rubor, Erythema. Periwound temperature was noted as No Abnormality. Assessment Active Problems ICD-10 Pressure ulcer of left buttock, stage 4 Type 2 diabetes mellitus with other skin ulcer Pressure ulcer of right buttock, stage 3 Procedures Wound #12 Pre-procedure diagnosis of Wound #12 is a Pressure Ulcer located on the Left Ischial Tuberosity . There was a Selective/Open Wound Non-Viable Tissue Debridement with a total area of 3 sq cm performed by Fredirick Maudlin, MD. With the following instrument(s): Curette to remove Non-Viable tissue/material. Material removed includes [facility]  after achieving pain control using Lidocaine 4% Topical Solution. No specimens were taken. A time out was conducted at 11:25, prior to the start of the procedure. A Minimum amount of bleeding was controlled with Pressure. The procedure was tolerated well with a pain level of 0 throughout and a pain level of 0 following the procedure. Post Debridement Measurements: 4.2cm length x 7.3cm width x 3.1cm depth; 74.649cm^3 volume. Post debridement Stage noted as Category/Stage IV. Character of Wound/Ulcer Post Debridement is improved. Post procedure Diagnosis Wound #12: Same as Pre-Procedure General Notes: scribed by Baruch Gouty, RN for Dr. Celine Ahr. Wound #15 Pre-procedure diagnosis of Wound #15 is a Pressure Ulcer located on the Right Ischium . There was a Excisional Skin/Subcutaneous Tissue Debridement with a [name], STRAHM ([id number]) [id number] Page 15 of 18 total area of 2.55 sq cm performed by Fredirick Maudlin, MD. With the following instrument(s): Curette to remove Viable and Non-Viable tissue/material. Material removed includes Subcutaneous Tissue and Slough and after achieving pain control using Lidocaine 4% T opical Solution. No specimens were taken. A time out was conducted at 11:25, prior to the start of the procedure. A Minimum amount of bleeding was controlled with Pressure. The procedure was tolerated well with a pain level of 0 throughout and a pain level of 0 following the procedure. Post Debridement Measurements: 1.5cm length x 1.7cm width x 2.2cm depth; 4.406cm^3 volume. Post debridement Stage noted as Category/Stage III. Character of Wound/Ulcer Post Debridement is improved. Post procedure Diagnosis Wound #15: Same as Pre-Procedure General Notes: scribed by Baruch Gouty, RN for Dr. Celine Ahr. Plan Follow-up Appointments: Return appointment in 1 month. - Dr. Celine Ahr - Room 1 ****[facility]YER**** Anesthetic: Wound #12 Left Ischial Tuberosity: (In clinic)  Topical Lidocaine 4% applied to wound bed Wound #15 Right Ischium: (In clinic) Topical Lidocaine 4% applied to wound bed Bathing/ Shower/ Hygiene: May shower and wash wound with soap and water. - with dressing changes Edema Control - Lymphedema / SCD / Other: Elevate legs to the level of the heart or above for 30 minutes daily and/or when sitting, a frequency of: - throughout the day Off-Loading: Air fluidized (Group 3) mattress - Medical Modalities Turn and reposition every 2 hours - *** Try to shift from side to side and shift to belly (if tolerated),****avoid sitting up in bed except for meals Additional Orders / Instructions: Follow Nutritious Diet -  protein shakes 2-3 times per day, Juven 2 times per day Home Health: No change in wound care orders this week; continue Home Health for wound care. May utilize formulary equivalent dressing for wound treatment orders unless otherwise specified. Dressing changes to be completed by [place] on Monday / Wednesday / Friday except when patient has scheduled visit at [facility]. - increase visits to 3 times per week (spouse having difficulty with dressing changes) Other Home Health Orders/Instructions: - [facility] WOUND #12: - Ischial Tuberosity Wound Laterality: Left Cleanser: Soap and Water 1 x Per Day/30 Days Discharge Instructions: May shower and wash wound with dial antibacterial soap and water prior to dressing change. Cleanser: Wound Cleanser (Generic) 1 x Per Day/30 Days Discharge Instructions: Cleanse the wound with wound cleanser prior to applying a clean dressing using gauze sponges, not tissue or cotton balls. Peri-Wound Care: Skin Prep (Generic) 1 x Per Day/30 Days Discharge Instructions: Use skin prep as directed Prim Dressing: Medline Woven Gauze Sponges 4x4 (in/in) 1 x Per Day/30 Days ary Discharge Instructions: moisten with saline and pack lightly into wound Secondary Dressing: Zetuvit Plus Silicone Border Dressing  5x5 (in/in) (Generic) 1 x Per Day/30 Days Discharge Instructions: Apply silicone border or ABD padover primary dressing as directed. WOUND #15: - Ischium Wound Laterality: Right Cleanser: Soap and Water 1 x Per Day/30 Days Discharge Instructions: May shower and wash wound with dial antibacterial soap and water prior to dressing change. Cleanser: Wound Cleanser (Generic) 1 x Per Day/30 Days Discharge Instructions: Cleanse the wound with wound cleanser prior to applying a clean dressing using gauze sponges, not tissue or cotton balls. Peri-Wound Care: Skin Prep (Generic) 1 x Per Day/30 Days Discharge Instructions: Use skin prep as directed Prim Dressing: Medline Woven Gauze Sponges 4x4 (in/in) 1 x Per Day/30 Days ary Discharge Instructions: moisten with saline and pack lightly into wound Secondary Dressing: Zetuvit Plus Silicone Border Dressing 5x5 (in/in) (Generic) 1 x Per Day/30 Days Discharge Instructions: Apply silicone border or ABD padover primary dressing as directed. 01/30/2022: The left ischial wound is very clean with just a little bit of slough on the posterolateral aspect of the wound. She has some pressure induced deep tissue injury on the right ischial wound, but otherwise both are quite clean. I used a curette to debride the devitalized subcutaneous tissue from the right ischial wound. I also debrided slough from the small area affected on the left ischial wound. As they have been doing an excellent job with just saline wet-to-dry dressing changes, I am going to continue this. We have been working on getting her a proper sleeping surface and it sounds like we may have been able to secure that; the representative from the company will be contacting them later today. Electronic Signature(s) Signed: 01/30/2022 11:51:43 AM By: Fredirick Maudlin MD FACS Entered By: Fredirick Maudlin on 01/30/2022 11:51:43 HxROS  Details -------------------------------------------------------------------------------- [name] ([id number]) [id number] Page 16 of 18 Patient Name: Date of Service: [name], [name]. 01/30/2022 10:30 A M Medical Record Number: [id number] Patient Account Number: [id number] Date of Birth/Sex: Treating RN: [birth year] (71 y.o. F) Primary Care Provider: [name] Other Clinician: Referring Provider: Treating Provider/Extender: Nance Pew in Treatment: 57 Information Obtained From Patient Eyes Medical History: Negative for: Cataracts; Glaucoma; Optic Neuritis Ear/Nose/Mouth/Throat Medical History: Negative for: Chronic sinus problems/congestion; Middle ear problems Hematologic/Lymphatic Medical History: Positive for: Anemia Negative for: Hemophilia; Human Immunodeficiency Virus; Lymphedema; Sickle Cell Disease Respiratory Medical History: Negative for:  Aspiration; Asthma; Chronic Obstructive Pulmonary Disease (COPD); Pneumothorax; Sleep Apnea; Tuberculosis Cardiovascular Medical History: Positive for: Hypertension Negative for: Angina; Arrhythmia; Congestive Heart Failure; Coronary Artery Disease; Deep Vein Thrombosis; Hypotension; Myocardial Infarction; Peripheral Arterial Disease; Peripheral Venous Disease; Phlebitis; Vasculitis Gastrointestinal Medical History: Negative for: Cirrhosis ; Colitis; Crohns; Hepatitis A; Hepatitis B; Hepatitis C Past Medical History Notes: constipation GERD Endocrine Medical History: Positive for: Type II Diabetes Negative for: Type I Diabetes Time with diabetes: 1 year Treated with: Oral agents Blood sugar tested every day: No Genitourinary Medical History: Negative for: End Stage Renal Disease Past Medical History Notes: UTI kidney stones neurogenic bladder Immunological Medical History: Negative for: Lupus Erythematosus; Raynauds; Scleroderma Integumentary  (Skin) Medical History: Negative for: History of Burn Past Medical History Notes: left gluteal fold sacral Musculoskeletal Carlisi, [name] ([id number]) [id number] Page 17 of 18 Medical History: Positive for: Osteoarthritis Negative for: Gout; Rheumatoid Arthritis; Osteomyelitis Past Medical History Notes: cervical neuropathy osteomyelitis Neurologic Medical History: Positive for: Dementia; Paraplegia Negative for: Neuropathy; Quadriplegia Oncologic Medical History: Positive for: Received Radiation - 2012 - right breast Psychiatric Medical History: Negative for: Anorexia/bulimia; Confinement Anxiety Past Medical History Notes: admitted for suicide risk on 12/02/2018 Immunizations Pneumococcal Vaccine: Received Pneumococcal Vaccination: Yes Received Pneumococcal Vaccination On or After 60th Birthday: No Implantable Devices None Hospitalization / Surgery History Type of Hospitalization/Surgery left knee replacement right breast lumpectomy cervical laminectomy with fusion hysterectomy tear duct surgery Tand A UTI suprapubic cath placement UTI Family and Social History Unknown History: Yes; Cancer: Yes - Mother; Diabetes: Yes - Maternal Grandparents; Heart Disease: Yes - Mother; Hereditary Spherocytosis: No; Hypertension: Yes - Mother; Kidney Disease: No; Lung Disease: No; Seizures: No; Stroke: No; Thyroid Problems: Yes - Mother; Tuberculosis: No; Former smoker - ended on 11/06/2014; Marital Status - Married; Alcohol Use: Never; Drug Use: No History; Caffeine Use: Daily - T soda; Financial Concerns: No; ea, Food, Clothing or Shelter Needs: No; Support System Lacking: No; Transportation Concerns: No Electronic Signature(s) Signed: 01/30/2022 11:53:22 AM By: Fredirick Maudlin MD FACS Entered By: Fredirick Maudlin on 01/30/2022 11:49:41 -------------------------------------------------------------------------------- SuperBill Details Patient  Name: Date of Service: [name] 01/30/2022 Medical Record Number: [id number] Patient Account Number: [id number] Date of Birth/Sex: Treating RN: [birth year] (71 y.o. 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, stage 4 E11.622 Type 2 diabetes mellitus with other skin ulcer L89.313 Pressure ulcer of right buttock, stage 3 Facility Procedures : The patient participates with Medicare or their insurance follows the Medicare Facility Guidelines: CPT4 Code Description Modifier Quantity [id number] 11042 - DEB SUBQ TISSUE 20 SQ CM/< 1 ICD-10 Diagnosis Description L89.313 Pressure ulcer of right  buttock, stage 3 : The patient participates with Medicare or their insurance follows the Medicare Facility Guidelines: [id number] - DEBRIDE WOUND 1ST 20 SQ CM OR < 1 ICD-10 Diagnosis Description 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, stage 4 Physician Procedures : CPT4 Code Description Modifier [id number] - WC PHYS LEVEL 3 - EST PT 25 ICD-10 Diagnosis Description 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

## 2022-01-31 ENCOUNTER — Ambulatory Visit (INDEPENDENT_AMBULATORY_CARE_PROVIDER_SITE_OTHER): Payer: Medicare Other | Admitting: Podiatry

## 2022-01-31 DIAGNOSIS — S82891D Other fracture of right lower leg, subsequent encounter for closed fracture with routine healing: Secondary | ICD-10-CM

## 2022-01-31 DIAGNOSIS — L97312 Non-pressure chronic ulcer of right ankle with fat layer exposed: Secondary | ICD-10-CM

## 2022-02-03 NOTE — Progress Notes (Signed)
Subjective: Chief Complaint  Patient presents with   Fracture    Cast removal, dressing change and cast application     71 year old female presents the office with her husband for follow-up evaluation of right ankle fracture.She presents today for cast change.  Still on antibiotics, doxycycline.  No fevers or chills.  Not yet had the ABI performed.  Objective: AAO x3, NAD DP/PT pulses palpable bilaterally, CRT less than 3 seconds Upon removal of the bandage to the lateral ankle there is a full-thickness ulceration noted to the lateral aspect the ankle however the appear to be improving.  The wound to the ankle appears to be improved there is no probing to bone, no tunneling.  Wound is more granular and there is no drainage or pus.  No fluctuation or crepitation there is no odor.  Ankle appears to be more stable.  No pain with calf compression, swelling, warmth, erythema   Assessment: Full-thickness ulceration, ankle fracture  Plan: -All treatment options discussed with the patient including all alternatives, risks, complications.  -Cleaned the wound with saline.  Small amount of Betadine followed by a nonstick dressing and a dry sterile dressing applied.  I then applied a below-knee fiberglass cast making sure to pad all bony prominences. -Given the clear drainage we will restart doxycycline. -She is nonweightbearing at baseline. We again discussed risk of amputation unfortunately. -Monitor for any clinical signs or symptoms of infection and directed to call the office immediately should any occur or go to the ER.  Trula Slade DPM

## 2022-02-04 NOTE — Progress Notes (Signed)
STASHIA, [name] ([id number]) [id number] Page 1 of 9 Visit Report for 01/30/2022 Arrival Information Details Patient Name: Date of Service: [name], LABO. 01/30/2022 10:30 A M Medical Record Number: [id number] Patient Account Number: [id number] Date of Birth/Sex: Treating RN: 1950-04-17 (71 y.o. [name] Primary Care Juel Ripley: [name] Other Clinician: Referring Blase Beckner: Treating Caliyah Sieh/Extender: Nance Pew in Treatment: 46 Visit Information History Since Last Visit Added or deleted any medications: No Patient Arrived: Wheel Chair Any new allergies or adverse reactions: No Arrival Time: 10:43 Had a fall or experienced change in No Accompanied By: spouse activities of daily living that may affect Transfer Assistance: None risk of falls: Patient Identification Verified: Yes Signs or symptoms of abuse/neglect since last visito No Secondary Verification Process Completed: Yes Hospitalized since last visit: Yes Patient Requires Transmission-Based Precautions: No Implantable device outside of the clinic excluding No Patient Has Alerts: No cellular tissue based products placed in the center since last visit: Has Dressing in Place as Prescribed: Yes Pain Present Now: No Electronic Signature(s) Signed: 01/30/2022 4:58:33 PM By: Baruch Gouty RN, BSN Entered By: Baruch Gouty on 01/30/2022 10:44:05 -------------------------------------------------------------------------------- Encounter Discharge Information Details Patient Name: Date of Service: [name] 01/30/2022 10:30 A M Medical Record Number: [id number] Patient Account Number: [id number] Date of Birth/Sex: Treating RN: [birth year] (71 y.o. [name] Primary Care Tobby Fawcett: [name] Other Clinician: Referring Aizah Gehlhausen: Treating Laxmi Choung/Extender: Nance Pew in Treatment: [phone] Encounter Discharge Information Items  Post Procedure Vitals Discharge Condition: Stable Temperature (F): 97.7 Ambulatory Status: Wheelchair Pulse (bpm): 75 Discharge Destination: Home Respiratory Rate (breaths/min): 18 Transportation: Private Auto Blood Pressure (mmHg): 101/59 Accompanied By: spouse Schedule Follow-up Appointment: Yes Clinical Summary of Care: Patient Declined Electronic Signature(s) Signed: 01/30/2022 4:58:33 PM By: Baruch Gouty RN, BSN Entered By: Baruch Gouty on 01/30/2022 11:52:51 Spielberg, [name] ([id number]) [id number] Page 2 of 9 -------------------------------------------------------------------------------- Lower Extremity Assessment Details Patient Name: Date of Service: [name] 01/30/2022 10:30 A M Medical Record Number: [id number] Patient Account Number: [id number] Date of Birth/Sex: Treating RN: January 16, 1951 (71 y.o. [name] Primary Care Lacoya Wilbanks: [name] Other Clinician: Referring Trenika Hudson: Treating Shondale Quinley/Extender: Nance Pew in Treatment: 161 Electronic Signature(s) Signed: 01/30/2022 4:58:33 PM By: Baruch Gouty RN, BSN Entered By: Baruch Gouty on 01/30/2022 10:46:56 -------------------------------------------------------------------------------- Multi Wound Chart Details Patient Name: Date of Service: [name], [name]. 01/30/2022 10:30 A M Medical Record Number: [id number] Patient Account Number: [id number] Date of Birth/Sex: Treating RN: [birth year] (71 y.o. F) Primary Care Litzi Binning: [name] Other Clinician: Referring Maleik Vanderzee: Treating Charron Coultas/Extender: Nance Pew in Treatment: 161 Vital Signs Height(in): 37 Pulse(bpm): 23 Weight(lbs): 185 Blood Pressure(mmHg): 101/59 Body Mass Index(BMI): 32.8 Temperature(F): 97.6 Respiratory Rate(breaths/min): 18 [12:Photos:] [N/A:N/A] Left Ischial Tuberosity Right Ischium N/A Wound Location: Pressure Injury Pressure  Injury N/A Wounding Event: Pressure Ulcer Pressure Ulcer N/A Primary Etiology: Anemia, Hypertension, Type II Anemia, Hypertension, Type II N/A Comorbid History: Diabetes, Osteoarthritis, Dementia, Diabetes, Osteoarthritis, Dementia, Paraplegia, Received Radiation Paraplegia, Received Radiation 09/06/2018 07/21/2021 N/A Date Acquired: 161 26 N/A Weeks of Treatment: Open Open N/A Wound Status: No No N/A Wound Recurrence: 4.2x7.3x3.1 1.5x1.7x2.2 N/A Measurements L x W x D (cm) 24.08 2.003 N/A A (cm) : rea 74.649 4.406 N/A Volume (cm) : -10103.40% 87.90% N/A % Reduction in A rea: -35111.80% -165.30% N/A % Reduction in Volume: 3 Starting Position 1 (o'clock): 4 Ending Position 1 (o'clock): 2 Maximum Distance 1 (  cm): Yes No N/A Undermining: Category/Stage IV Category/Stage III N/A Classification: Medium Medium N/A Exudate A mount: Serosanguineous Serosanguineous N/A Exudate Type: TIAMARIE, [name] ([id number]) [id number] Page 3 of 9 red, brown red, brown N/A Exudate Color: Well defined, not attached Distinct, outline attached N/A Wound Margin: Large (67-100%) Large (67-100%) N/A Granulation Amount: Red, Pink Red N/A Granulation Quality: Small (1-33%) Small (1-33%) N/A Necrotic Amount: Fat Layer (Subcutaneous Tissue): Yes Fat Layer (Subcutaneous Tissue): Yes N/A Exposed Structures: Fascia: No Fascia: No Tendon: No Tendon: No Muscle: No Muscle: No Joint: No Joint: No Bone: No Bone: No Small (1-33%) None N/A Epithelialization: Debridement - Selective/Open Wound Debridement - Excisional N/A Debridement: Pre-procedure Verification/Time Out 11:25 11:25 N/A Taken: Lidocaine 4% Topical Solution Lidocaine 4% Topical Solution N/A Pain Control: Slough Subcutaneous, Slough N/A Tissue Debrided: Non-Viable Tissue Skin/Subcutaneous Tissue N/A Level: 3 2.55 N/A Debridement A (sq cm): rea Curette Curette N/A Instrument: Minimum  Minimum N/A Bleeding: Pressure Pressure N/A Hemostasis A chieved: 0 0 N/A Procedural Pain: 0 0 N/A Post Procedural Pain: Procedure was tolerated well Procedure was tolerated well N/A Debridement Treatment Response: 4.2x7.3x3.1 1.5x1.7x2.2 N/A Post Debridement Measurements L x W x D (cm) 74.649 4.406 N/A Post Debridement Volume: (cm) Category/Stage IV Category/Stage III N/A Post Debridement Stage: Scarring: Yes Scarring: Yes N/A Periwound Skin Texture: Excoriation: No Excoriation: No Induration: No Induration: No Callus: No Callus: No Crepitus: No Crepitus: No Rash: No Rash: No Dry/Scaly: Yes Dry/Scaly: Yes N/A Periwound Skin Moisture: Maceration: No Maceration: No Atrophie Blanche: No Atrophie Blanche: No N/A Periwound Skin Color: Cyanosis: No Cyanosis: No Ecchymosis: No Ecchymosis: No Erythema: No Erythema: No Hemosiderin Staining: No Hemosiderin Staining: No Mottled: No Mottled: No Pallor: No Pallor: No Rubor: No Rubor: No No Abnormality No Abnormality N/A Temperature: Yes N/A N/A Tenderness on Palpation: Debridement Debridement N/A Procedures Performed: Treatment Notes Electronic Signature(s) Signed: 01/30/2022 11:48:38 AM By: Fredirick Maudlin MD FACS Entered By: Fredirick Maudlin on 01/30/2022 11:48:37 -------------------------------------------------------------------------------- Multi-Disciplinary Care Plan Details Patient Name: Date of Service: [name] 01/30/2022 10:30 A M Medical Record Number: [id number] Patient Account Number: [id number] Date of Birth/Sex: Treating RN: 16-Jun-1950 (71 y.o. [name] Primary Care Lurline Caver: [name] Other Clinician: Referring Emma Schupp: Treating Ndia Sampath/Extender: Nance Pew in Treatment: [place] reviewed with physician Active Inactive Pressure Nursing Diagnoses: HARLYNN, KIMBELL ([id number]) [id number]  Page 4 of 9 Knowledge deficit related to management of pressures ulcers Potential for impaired tissue integrity related to pressure, friction, moisture, and shear Goals: Patient/caregiver will verbalize understanding of pressure ulcer management Date Initiated: 05/22/2021 Target Resolution Date: 02/27/2022 Goal Status: Active Interventions: Assess: immobility, friction, shearing, incontinence upon admission and as needed Assess offloading mechanisms upon admission and as needed Notes: Wound/Skin Impairment Nursing Diagnoses: Knowledge deficit related to ulceration/compromised skin integrity Goals: Patient/caregiver will verbalize understanding of skin care regimen Date Initiated: 12/30/2018 Target Resolution Date: 02/27/2022 Goal Status: Active Interventions: Assess patient/caregiver ability to obtain necessary supplies Assess patient/caregiver ability to perform ulcer/skin care regimen upon admission and as needed Assess ulceration(s) every visit Provide education on ulcer and skin care Treatment Activities: Skin care regimen initiated : 12/30/2018 Topical wound management initiated : 12/30/2018 Notes: 01/08/21: Wound care regimen continues Electronic Signature(s) Signed: 01/30/2022 4:58:33 PM By: Baruch Gouty RN, BSN Entered By: Baruch Gouty on 01/30/2022 11:05:47 -------------------------------------------------------------------------------- Pain Assessment Details Patient Name: Date of Service: [name] 01/30/2022 10:30 A M Medical Record Number: [id number] Patient Account Number: [id number] Date of  Birth/Sex: Treating RN: 10-10-1950 (71 y.o. [name] Primary Care Ziv Welchel: [name] Other Clinician: Referring Gwendolin Briel: Treating Khushi Zupko/Extender: Nance Pew in Treatment: 161 Active Problems Location of Pain Severity and Description of Pain Patient Has Paino No Site Locations Rate the pain. LAPORSCHA, LINEHAN P ([id number])  [id number] Page 5 of 9 Rate the pain. Current Pain Level: 0 Pain Management and Medication Current Pain Management: Electronic Signature(s) Signed: 01/30/2022 4:58:33 PM By: Baruch Gouty RN, BSN Entered By: Baruch Gouty on 01/30/2022 10:46:48 -------------------------------------------------------------------------------- Patient/Caregiver Education Details Patient Name: Date of Service: [name] 10/26/2023andnbsp10:30 A M Medical Record Number: [id number] Patient Account Number: [id number] Date of Birth/Gender: Treating RN: 03-16-1951 (70 y.o. [name] Primary Care Physician: [name] Other Clinician: Referring Physician: Treating Physician/Extender: Nance Pew in Treatment: 161 Education Assessment Education Provided To: Patient Education Topics Provided Pressure: Methods: Explain/Verbal Responses: Reinforcements needed, State content correctly Wound/Skin Impairment: Methods: Explain/Verbal Responses: Reinforcements needed, State content correctly Electronic Signature(s) Signed: 01/30/2022 4:58:33 PM By: Baruch Gouty RN, BSN Entered By: Baruch Gouty on 01/30/2022 11:07:50 -------------------------------------------------------------------------------- Wound Assessment Details Patient Name: Date of Service: [name], [place] 01/30/2022 10:30 A Darreld Mclean, [name] ([id number]) [id number] Page 6 of 9 Medical Record Number: [id number] Patient Account Number: [id number] Date of Birth/Sex: Treating RN: [birth year] (71 y.o. Martyn Malay, Vaughan Basta Primary Care Cashmere Harmes: [name] Other Clinician: Referring Humna Moorehouse: Treating Almeter Westhoff/Extender: Burgess Amor Weeks in Treatment: 161 Wound Status Wound Number: 12 Primary Pressure Ulcer Etiology: Wound Location: Left Ischial Tuberosity Wound Open Wounding Event: Pressure Injury Status: Date Acquired:  09/06/2018 Comorbid Anemia, Hypertension, Type II Diabetes, Osteoarthritis, Weeks Of Treatment: 161 History: Dementia, Paraplegia, Received Radiation Clustered Wound: No Photos Wound Measurements Length: (cm) 4.2 Width: (cm) 7.3 Depth: (cm) 3.1 Area: (cm) 24.08 Volume: (cm) 74.649 % Reduction in Area: -10103.4% % Reduction in Volume: -35111.8% Epithelialization: Small (1-33%) Tunneling: No Undermining: Yes Starting Position (o'clock): 3 Ending Position (o'clock): 4 Maximum Distance: (cm) 2 Wound Description Classification: Category/Stage IV Wound Margin: Well defined, not attached Exudate Amount: Medium Exudate Type: Serosanguineous Exudate Color: red, brown Foul Odor After Cleansing: No Slough/Fibrino Yes Wound Bed Granulation Amount: Large (67-100%) Exposed Structure Granulation Quality: Red, Pink Fascia Exposed: No Necrotic Amount: Small (1-33%) Fat Layer (Subcutaneous Tissue) Exposed: Yes Necrotic Quality: Adherent Slough Tendon Exposed: No Muscle Exposed: No Joint Exposed: No Bone Exposed: No Periwound Skin Texture Texture Color No Abnormalities Noted: No No Abnormalities Noted: No Callus: No Atrophie Blanche: No Crepitus: No Cyanosis: No Excoriation: No Ecchymosis: No Induration: No Erythema: No Rash: No Hemosiderin Staining: No Scarring: Yes Mottled: No Pallor: No Moisture Rubor: No No Abnormalities Noted: No Dry / Scaly: Yes Temperature / Pain Maceration: No Temperature: No Abnormality Tenderness on Palpation: Yes Treatment Notes Wound #12 (Ischial Tuberosity) Wound Laterality: Left Radi, [name] ([id number]) [id number] Page 7 of 9 Cleanser Soap and Water Discharge Instruction: May shower and wash wound with dial antibacterial soap and water prior to dressing change. Wound Cleanser Discharge Instruction: Cleanse the wound with wound cleanser prior to applying a clean dressing using gauze sponges, not tissue  or cotton balls. Peri-Wound Care Skin Prep Discharge Instruction: Use skin prep as directed Topical Primary Dressing Medline Woven Gauze Sponges 4x4 (in/in) Discharge Instruction: moisten with saline and pack lightly into wound Secondary Dressing Zetuvit Plus Silicone Border Dressing 5x5 (in/in) Discharge Instruction: Apply silicone border or ABD padover primary dressing as directed. Secured With Compression Wrap Compression Stockings  Add-Ons Electronic Signature(s) Signed: 01/30/2022 4:58:33 PM By: Baruch Gouty RN, BSN Signed: 02/04/2022 4:16:20 PM By: Blanche East RN Entered By: Blanche East on 01/30/2022 11:09:53 -------------------------------------------------------------------------------- Wound Assessment Details Patient Name: Date of Service: [name] 01/30/2022 10:30 A M Medical Record Number: [id number] Patient Account Number: [id number] Date of Birth/Sex: Treating RN: 17-Feb-1951 (71 y.o. Martyn Malay, Vaughan Basta Primary Care Fatiha Guzy: [name] Other Clinician: Referring Aaleigha Bozza: Treating Lathen Seal/Extender: Burgess Amor Weeks in Treatment: 161 Wound Status Wound Number: 15 Primary Pressure Ulcer Etiology: Wound Location: Right Ischium Wound Open Wounding Event: Pressure Injury Status: Date Acquired: 07/21/2021 Comorbid Anemia, Hypertension, Type II Diabetes, Osteoarthritis, Weeks Of Treatment: 26 History: Dementia, Paraplegia, Received Radiation Clustered Wound: No Photos Wound Measurements Length: (cm) 1.5 Width: (cm) 1.7 Moger, Mikalyn M ([id number]) Depth: (cm) 2.2 Area: (cm) 2.003 Volume: (cm) 4.406 % Reduction in Area: 87.9% % Reduction in Volume: -165.3% [id number] Page 8 of 9 Epithelialization: None Tunneling: No Undermining: No Wound Description Classification: Category/Stage III Wound Margin: Distinct, outline attached Exudate Amount: Medium Exudate Type: Serosanguineous Exudate  Color: red, brown Foul Odor After Cleansing: No Slough/Fibrino Yes Wound Bed Granulation Amount: Large (67-100%) Exposed Structure Granulation Quality: Red Fascia Exposed: No Necrotic Amount: Small (1-33%) Fat Layer (Subcutaneous Tissue) Exposed: Yes Necrotic Quality: Adherent Slough Tendon Exposed: No Muscle Exposed: No Joint Exposed: No Bone Exposed: No Periwound Skin Texture Texture Color No Abnormalities Noted: No No Abnormalities Noted: No Callus: No Atrophie Blanche: No Crepitus: No Cyanosis: No Excoriation: No Ecchymosis: No Induration: No Erythema: No Rash: No Hemosiderin Staining: No Scarring: Yes Mottled: No Pallor: No Moisture Rubor: No No Abnormalities Noted: No Dry / Scaly: Yes Temperature / Pain Maceration: No Temperature: No Abnormality Treatment Notes Wound #15 (Ischium) Wound Laterality: Right Cleanser Soap and Water Discharge Instruction: May shower and wash wound with dial antibacterial soap and water prior to dressing change. Wound Cleanser Discharge Instruction: Cleanse the wound with wound cleanser prior to applying a clean dressing using gauze sponges, not tissue or cotton balls. Peri-Wound Care Skin Prep Discharge Instruction: Use skin prep as directed Topical Primary Dressing Medline Woven Gauze Sponges 4x4 (in/in) Discharge Instruction: moisten with saline and pack lightly into wound Secondary Dressing Zetuvit Plus Silicone Border Dressing 5x5 (in/in) Discharge Instruction: Apply silicone border or ABD padover primary dressing as directed. Secured With Compression Wrap Compression Stockings Environmental education officer) Signed: 01/30/2022 4:58:33 PM By: Baruch Gouty RN, BSN Signed: 02/04/2022 4:16:20 PM By: Blanche East RN Entered By: Blanche East on 01/30/2022 11:10:36 Swamy, [name] ([id number]) [id number] Page 9 of  9 -------------------------------------------------------------------------------- Vitals Details Patient Name: Date of Service: LLESENIA, FOGAL 01/30/2022 10:30 A M Medical Record Number: [id number] Patient Account Number: [id number] Date of Birth/Sex: Treating RN: 1951-01-24 (71 y.o. Martyn Malay, Vaughan Basta Primary Care Hunt Zajicek: [name] Other Clinician: Referring Lakia Gritton: Treating Chanita Boden/Extender: Nance Pew in Treatment: 161 Vital Signs Time Taken: 10:44 Temperature (F): 97.6 Height (in): 63 Pulse (bpm): 75 Weight (lbs): 185 Respiratory Rate (breaths/min): 18 Body Mass Index (BMI): 32.8 Blood Pressure (mmHg): 101/59 Reference Range: 80 - 120 mg / dl Electronic Signature(s) Signed: 01/30/2022 4:58:33 PM By: Baruch Gouty RN, BSN Entered By: Baruch Gouty on 01/30/2022 10:46:35

## 2022-02-07 ENCOUNTER — Ambulatory Visit (INDEPENDENT_AMBULATORY_CARE_PROVIDER_SITE_OTHER): Payer: Medicare Other

## 2022-02-07 ENCOUNTER — Ambulatory Visit: Payer: Medicare Other | Admitting: Podiatry

## 2022-02-07 ENCOUNTER — Ambulatory Visit (INDEPENDENT_AMBULATORY_CARE_PROVIDER_SITE_OTHER): Payer: Medicare Other | Admitting: Podiatry

## 2022-02-07 DIAGNOSIS — L97312 Non-pressure chronic ulcer of right ankle with fat layer exposed: Secondary | ICD-10-CM

## 2022-02-07 DIAGNOSIS — S82891D Other fracture of right lower leg, subsequent encounter for closed fracture with routine healing: Secondary | ICD-10-CM

## 2022-02-09 NOTE — Progress Notes (Signed)
Subjective: No chief complaint on file.   71 year old female presents the office with her husband for follow-up evaluation of right ankle fracture.She presents today for cast change.  She is on doxycycline.  Denies any fevers or chills.  No other concerns.  Objective: AAO x3, NAD DP/PT pulses palpable bilaterally, CRT less than 3 seconds Upon removal of the bandage to the lateral ankle there is a full-thickness ulceration noted to the lateral aspect the ankle however the appear to be improving.  Wound appears to be much improved.  Granular base but any probing, no tunneling.  There is no fluctuation or crepitation but there is no drainage or pus. No pain with calf compression, swelling, warmth, erythema      Assessment: Full-thickness ulceration, ankle fracture  Plan: -All treatment options discussed with the patient including all alternatives, risks, complications.  -X-ray today reviewed.  Fracture noted the distal tibia, fibula.  No evidence of acute osteomyelitis.  Radiolucency still noted from the fracture. -Cast was changed today.  Betadine was applied followed by dressing.  A well-padded below-knee fiberglass cast was applied making sure to pad all bony promises.  Follow-up in 1 week.    Trula Slade DPM

## 2022-02-12 ENCOUNTER — Other Ambulatory Visit: Payer: Self-pay

## 2022-02-12 DIAGNOSIS — I739 Peripheral vascular disease, unspecified: Secondary | ICD-10-CM

## 2022-02-13 ENCOUNTER — Other Ambulatory Visit: Payer: Medicare Other

## 2022-02-14 ENCOUNTER — Ambulatory Visit (INDEPENDENT_AMBULATORY_CARE_PROVIDER_SITE_OTHER): Payer: Medicare Other | Admitting: Podiatry

## 2022-02-14 DIAGNOSIS — S82891D Other fracture of right lower leg, subsequent encounter for closed fracture with routine healing: Secondary | ICD-10-CM | POA: Diagnosis not present

## 2022-02-14 DIAGNOSIS — L97312 Non-pressure chronic ulcer of right ankle with fat layer exposed: Secondary | ICD-10-CM | POA: Diagnosis not present

## 2022-02-16 NOTE — Progress Notes (Signed)
Subjective: No chief complaint on file.   71 year old female presents the office with her husband for follow-up evaluation of right ankle fracture.  She presents today for cast removal.  States that she has been doing well.  No fevers or chills.  Objective: AAO x3, NAD DP/PT pulses palpable bilaterally, CRT less than 3 seconds Upon removal of the bandage the wound is healed on the ankle.  There is no skin breakdown.  Some dry skin is noted but there is no drainage or pus.  There is no fluctuation of the tissue.  There is slight edema.  There is no erythema or warmth.  Ankle appears to be more stable today. No pain with calf compression, swelling, warmth, erythema   Assessment: Full-thickness ulceration, ankle fracture  Plan: -All treatment options discussed with the patient including all alternatives, risks, complications.  -Cast was removed today.  The wound appears to have healed.  Today I did place into the cam boot which she brought with her.  Offloading pads to help offload and protect the area where the wound was at all prevent reoccurrence.  Discussed this with her husband as well as continues to bandage.  If there is any issues or reoccurrence of the wound let us know immediately.  Monitor for any signs or symptoms of infection.  Return in about 1 week (around 02/21/2022) for [place].  Trula Slade DPM

## 2022-02-21 ENCOUNTER — Other Ambulatory Visit: Payer: Medicare Other | Admitting: Podiatry

## 2022-02-25 ENCOUNTER — Ambulatory Visit (INDEPENDENT_AMBULATORY_CARE_PROVIDER_SITE_OTHER): Payer: Medicare Other | Admitting: Podiatry

## 2022-02-25 ENCOUNTER — Ambulatory Visit (INDEPENDENT_AMBULATORY_CARE_PROVIDER_SITE_OTHER): Payer: Medicare Other

## 2022-02-25 DIAGNOSIS — S82891D Other fracture of right lower leg, subsequent encounter for closed fracture with routine healing: Secondary | ICD-10-CM | POA: Diagnosis not present

## 2022-02-25 DIAGNOSIS — L97312 Non-pressure chronic ulcer of right ankle with fat layer exposed: Secondary | ICD-10-CM | POA: Diagnosis not present

## 2022-02-25 MED ORDER — DOXYCYCLINE HYCLATE 100 MG PO TABS: 100.0000 mg | ORAL_TABLET | Freq: Two times a day (BID) | ORAL | 0 refills | Status: DC

## 2022-02-25 NOTE — Progress Notes (Signed)
Subjective: No chief complaint on file.    71 year old female presents the office with her husband for follow-up evaluation of right ankle fracture.  They been wearing the boot but husband thinks that the boots been rubbing.  Also he was washing some dry skin at the top of the foot and the wound is open on the top of the foot as well. He states that he scrubbed it too hard.  Denies any fevers or chills.    Objective: AAO x3, NAD DP/PT pulses palpable bilaterally, CRT less than 3 seconds Large area of superficial area skin breakdown the lateral aspect of the ankle on the fibula but there is no probing to bone, and or tunneling.  Superficial skin breakdown.  No drainage or pus.  Partial-thickness ulceration on the dorsal aspect the midfoot as well.  Cinoman clear drainage expressed but no frank purulence.  No surrounding erythema to either wound.  There is no fluctuance or crepitation.  No malodor. No pain with calf compression, swelling, warmth, erythema  Assessment: Right ankle, foot ulceration, ankle fracture  Plan: -All treatment options discussed with the patient including all alternatives, risks, complications.  -X-rays obtained reviewed.  Fracture of the ankle noted.  There is some displacement of the fracture. -Given the reoccurrence of the pain is coming from the boot.  I cleaned both of the wounds and applied Betadine followed by dressing.  A well-padded below-knee fiberglass cast was applied making sure to pad all bony prominences.  Clinically the ankle appears to be more stable. -Doxycycline  Trula Slade DPM    Washed top of the foot too hard and skincame off

## 2022-02-26 ENCOUNTER — Other Ambulatory Visit: Payer: Medicare Other

## 2022-03-03 ENCOUNTER — Other Ambulatory Visit: Payer: Self-pay | Admitting: Podiatry

## 2022-03-03 DIAGNOSIS — L97312 Non-pressure chronic ulcer of right ankle with fat layer exposed: Secondary | ICD-10-CM

## 2022-03-03 DIAGNOSIS — S82891D Other fracture of right lower leg, subsequent encounter for closed fracture with routine healing: Secondary | ICD-10-CM

## 2022-03-05 ENCOUNTER — Telehealth: Payer: Self-pay | Admitting: Podiatry

## 2022-03-05 NOTE — Telephone Encounter (Signed)
Mila Merry from [facility] called and they called pt to schedule from referral and pt declined an appt.

## 2022-03-06 ENCOUNTER — Encounter (HOSPITAL_BASED_OUTPATIENT_CLINIC_OR_DEPARTMENT_OTHER): Payer: Medicare Other | Attending: General Surgery | Admitting: General Surgery

## 2022-03-06 DIAGNOSIS — G8220 Paraplegia, unspecified: Secondary | ICD-10-CM | POA: Diagnosis not present

## 2022-03-06 DIAGNOSIS — E119 Type 2 diabetes mellitus without complications: Secondary | ICD-10-CM | POA: Insufficient documentation

## 2022-03-06 DIAGNOSIS — L89324 Pressure ulcer of left buttock, stage 4: Secondary | ICD-10-CM | POA: Insufficient documentation

## 2022-03-06 DIAGNOSIS — Z853 Personal history of malignant neoplasm of breast: Secondary | ICD-10-CM | POA: Insufficient documentation

## 2022-03-06 DIAGNOSIS — L89313 Pressure ulcer of right buttock, stage 3: Secondary | ICD-10-CM | POA: Diagnosis not present

## 2022-03-06 DIAGNOSIS — Z87891 Personal history of nicotine dependence: Secondary | ICD-10-CM | POA: Diagnosis not present

## 2022-03-12 ENCOUNTER — Other Ambulatory Visit: Payer: Medicare Other

## 2022-03-17 ENCOUNTER — Other Ambulatory Visit: Payer: Medicare Other

## 2022-03-20 ENCOUNTER — Ambulatory Visit (INDEPENDENT_AMBULATORY_CARE_PROVIDER_SITE_OTHER): Payer: Medicare Other | Admitting: Podiatry

## 2022-03-20 DIAGNOSIS — L97511 Non-pressure chronic ulcer of other part of right foot limited to breakdown of skin: Secondary | ICD-10-CM | POA: Diagnosis not present

## 2022-03-20 DIAGNOSIS — L97312 Non-pressure chronic ulcer of right ankle with fat layer exposed: Secondary | ICD-10-CM | POA: Diagnosis not present

## 2022-03-20 DIAGNOSIS — S82891D Other fracture of right lower leg, subsequent encounter for closed fracture with routine healing: Secondary | ICD-10-CM

## 2022-03-22 NOTE — Progress Notes (Signed)
Subjective: Chief Complaint  Patient presents with   Fracture    Cast removal from the right leg. Patient will need wound care supplies ordered to be delivered to the house per Dr. Jacqualyn Posey will order [facility]. Patient placed in surgical shoe today along with Betadine wet to dry dressing    71 year old female presents the office with her husband for follow-up evaluation of right ankle fracture.  Unfortunate she did miss her appointment.  She is about finished with the course of antibiotics.  No fevers or chills.  No other concerns.  Objective: AAO x3, NAD DP/PT pulses palpable bilaterally, CRT less than 3 seconds Large area of superficial area skin breakdown the lateral aspect of the ankle on the fibula but there is no probing to bone, and or tunneling.  Superficial skin breakdown.  This appears to be very dry.  There is no probing to bone, and or tunneling.  Ulceration on the dorsal aspect of the midfoot with mild bloody drainage but there is no frank purulence.  There is no fluctuance or crepitation.  There is no malodor.  No pain with calf compression, swelling, warmth, erythema  Assessment: Right ankle, foot ulceration, ankle fracture  Plan: -All treatment options discussed with the patient including all alternatives, risks, complications.  -The wound had quite a bit of dry tissue present to the lateral aspect as well as dorsal.  Debrided the any complications or bleeding.  Betadine was applied followed by dressing.  At this time ankle appears to be more stable and she is nonweightbearing at baseline.  Would put her into a surgical shoe to help take pressure off of the wound but also that way it can help with daily dressing changes.  Medicated dressing supplies through [facility] to include collagen, silver dressing. -Previously ordered ABIs but they did not schedule. -Once the ankle is healed she continues to have wound issues will refer to the wound care center. -Monitor for any clinical signs  or symptoms of infection and directed to call the office immediately should any occur or go to the ER.  *X-ray foot/ankle next appointment  Trula Slade DPM

## 2022-03-27 ENCOUNTER — Telehealth: Payer: Self-pay | Admitting: *Deleted

## 2022-03-27 NOTE — Telephone Encounter (Signed)
Patient's husband is calling to check on the status of supplies from [facility], contacted them and said the order is scheduled to be at their home by 9:00 pm today.  Called,no answer, left a voice message to update the patient.

## 2022-04-03 ENCOUNTER — Encounter (HOSPITAL_BASED_OUTPATIENT_CLINIC_OR_DEPARTMENT_OTHER): Payer: Medicare Other | Attending: General Surgery | Admitting: General Surgery

## 2022-04-03 DIAGNOSIS — L89313 Pressure ulcer of right buttock, stage 3: Secondary | ICD-10-CM | POA: Insufficient documentation

## 2022-04-03 DIAGNOSIS — G8220 Paraplegia, unspecified: Secondary | ICD-10-CM | POA: Diagnosis not present

## 2022-04-03 DIAGNOSIS — E11622 Type 2 diabetes mellitus with other skin ulcer: Secondary | ICD-10-CM | POA: Insufficient documentation

## 2022-04-03 DIAGNOSIS — Z87891 Personal history of nicotine dependence: Secondary | ICD-10-CM | POA: Insufficient documentation

## 2022-04-03 DIAGNOSIS — L89324 Pressure ulcer of left buttock, stage 4: Secondary | ICD-10-CM | POA: Diagnosis present

## 2022-04-03 DIAGNOSIS — F0390 Unspecified dementia without behavioral disturbance: Secondary | ICD-10-CM | POA: Diagnosis not present

## 2022-04-03 DIAGNOSIS — K219 Gastro-esophageal reflux disease without esophagitis: Secondary | ICD-10-CM | POA: Insufficient documentation

## 2022-04-03 DIAGNOSIS — M1990 Unspecified osteoarthritis, unspecified site: Secondary | ICD-10-CM | POA: Insufficient documentation

## 2022-04-03 DIAGNOSIS — Z923 Personal history of irradiation: Secondary | ICD-10-CM | POA: Insufficient documentation

## 2022-04-03 DIAGNOSIS — I10 Essential (primary) hypertension: Secondary | ICD-10-CM | POA: Insufficient documentation

## 2022-04-03 NOTE — Progress Notes (Signed)
ZANI, KYLLONEN P ([id number]) [id number] Page 1 of 19 Visit Report for 04/03/2022 Chief Complaint Document Details Patient Name: Date of Service: [name], [name] 04/03/2022 10:45 A M Medical Record Number: [id number] Patient Account Number: [id number] Date of Birth/Sex: Treating RN: December 15, 1950 (71 y.o. F) Primary Care Provider: [name] Other Clinician: Referring Provider: Treating Provider/Extender: Nance Pew in Treatment: 170 Information Obtained from: Patient Chief Complaint She is here for follow up evaluation of left ischial pressure ulcer 12/30/2018; patient comes back for review of wounds in the same general area over the previously healed left ischial pressure ulcer Electronic Signature(s) Signed: 04/03/2022 11:10:15 AM By: Fredirick Maudlin MD FACS Entered By: Fredirick Maudlin on 04/03/2022 11:10:14 -------------------------------------------------------------------------------- Debridement Details Patient Name: Date of Service: [name] Brunner. 04/03/2022 10:45 A M Medical Record Number: [id number] Patient Account Number: [id number] Date of Birth/Sex: Treating RN: [birth year] (71 y.o. Harlow Ohms Primary Care Provider: [name] Other Clinician: Referring Provider: Treating Provider/Extender: Nance Pew in Treatment: 170 Debridement Performed for Assessment: Wound #12 Left Ischial Tuberosity Performed By: Physician Fredirick Maudlin, MD Debridement Type: Debridement Level of Consciousness (Pre-procedure): Awake and Alert Pre-procedure Verification/Time Out Yes - 11:05 Taken: Start Time: 11:05 T Area Debrided (L x W): otal 4 (cm) x 4 (cm) = 16 (cm) Tissue and other material debrided: Non-Viable, Muscle, Slough, Subcutaneous, Slough Level: Skin/Subcutaneous Tissue/Muscle Debridement Description: Excisional Instrument: Curette Bleeding: Minimum Hemostasis Achieved:  Pressure Response to Treatment: Procedure was tolerated well Level of Consciousness (Post- Awake and Alert procedure): Post Debridement Measurements of Total Wound Length: (cm) 4 Stage: Category/Stage IV Width: (cm) 7.6 Depth: (cm) 3 Volume: (cm) 71.628 Character of Wound/Ulcer Post Debridement: Improved Post Procedure Diagnosis Same as Pre-procedure Melchior, [name] ([id number]) [id number] Page 2 of 19 Notes scribed for Dr. Celine Ahr by Adline Peals, RN Electronic Signature(s) Signed: 04/03/2022 12:31:25 PM By: Fredirick Maudlin MD FACS Signed: 04/03/2022 4:31:58 PM By: Adline Peals Entered By: Adline Peals on 04/03/2022 11:07:10 -------------------------------------------------------------------------------- Debridement Details Patient Name: Date of Service: [name] Brunner. 04/03/2022 10:45 A M Medical Record Number: [id number] Patient Account Number: [id number] Date of Birth/Sex: Treating RN: [birth year] (71 y.o. Harlow Ohms Primary Care Provider: [name] Other Clinician: Referring Provider: Treating Provider/Extender: Nance Pew in Treatment: 170 Debridement Performed for Assessment: Wound #15 Right Ischium Performed By: Physician Fredirick Maudlin, MD Debridement Type: Debridement Level of Consciousness (Pre-procedure): Awake and Alert Pre-procedure Verification/Time Out Yes - 11:05 Taken: Start Time: 11:05 T Area Debrided (L x W): otal 1.4 (cm) x 1.3 (cm) = 1.82 (cm) Tissue and other material debrided: Non-Viable, Slough, Slough Level: Non-Viable Tissue Debridement Description: Selective/Open Wound Instrument: Curette Bleeding: Minimum Hemostasis Achieved: Pressure Response to Treatment: Procedure was tolerated well Level of Consciousness (Post- Awake and Alert procedure): Post Debridement Measurements of Total Wound Length: (cm) 1.4 Stage: Category/Stage III Width: (cm)  1.3 Depth: (cm) 1.7 Volume: (cm) 2.43 Character of Wound/Ulcer Post Debridement: Improved Post Procedure Diagnosis Same as Pre-procedure Notes scribed for Dr. Celine Ahr by Adline Peals, RN Electronic Signature(s) Signed: 04/03/2022 12:31:25 PM By: Fredirick Maudlin MD FACS Signed: 04/03/2022 4:31:58 PM By: Adline Peals Entered By: Adline Peals on 04/03/2022 11:07:46 -------------------------------------------------------------------------------- HPI Details Patient Name: Date of Service: [name] Brunner. 04/03/2022 10:45 A M Medical Record Number: [id number] Patient Account Number: [id number] Date of Birth/Sex: Treating RN: 1950-06-03 (71 y.o. F) Primary Care Provider: [name] Other Clinician: Referring Provider: Treating Provider/Extender: Fredirick Maudlin  [name] [place], [place] M ([id number]) [id number] Page 3 of 19 Weeks in Treatment: 170 History of Present Illness Location: open ulceration of the left gluteal area, left heel and right ankle for about 5 months. Quality: Patient reports No Pain. Severity: Patient states wound(s) are getting worse. Duration: Patient has had the wound for > 5 months prior to seeking treatment at the wound center Context: The wound occurred when the patient has been paraplegic for about 3 years. Modifying Factors: Wound improving due to current treatment. ssociated Signs and Symptoms: Patient reports having foul odor. A HPI Description: this 71 year old patient who is known to have hypertension, hypothyroidism, breast cancer, chronic pain syndrome, paraplegia was noted to have a left gluteal decubitus ulcer and was brought into the hospital. During the course of her hospitalization she was debrided in the operating room by ankle wound. Bone cultures were taken at that time but were negative but clinically she was treated for osteomyelitis because of the probing down to bone and open exposed bone.  Home health has been giving her antibioticss which include vancomycin and Zosyn. The patient was a smoker until about 3 weeks ago and used to smoke about 10 cigarettes a day for a long while. 12/13/2014 - details of her operative note from 11/03/2014 were reviewed -- PROCEDURE: 1. Excisional debridement skin, subcutaneous, muscle left ischium 35 cm2 2. Excisional debridement skin, subcutaneous tissue left heel 27 cm2 3. Excisional debridement right ankle skin, subcutaneous, bone 30 cm2 01/24/2015 -- she has some issues with her wheelchair cushion but other than that is doing very well and has received Podus boots for her feet. 02/14/2015 -- she was using her old offloading boots and this seemed to have caused her a new pressure ulcer on the left posterior heel near the superior part just below the Achilles tendon. 03/07/2015 -- she has a new ulceration just to the left of the midline on her sacral region more on the left buttock and this has been there for Dr. Leland Johns and had all the wounds sharply debrided. The debridement was done for the left ischial wound, the left heel wound and the right about a week. 08/22/2015 -- was recently admitted to hospital between May 5 and 08/13/2015, with sepsis and leukocytosis due to a UTI. she was treated for a sepsis complicating Escherichia coli UTI and kidney stones. She also had metabolic and careful up at the secondary to pyelonephritis. He received broad-spectrum antibiotics initially and then received Macrobid as per urology. She was sent home on nitrofurantoin. during her admission she had a CT scan which showed exposed left ischial tuberosity without evidence of osteolysis. 09/12/2015-- the patient is having some issues with her air mattress and would like to get a opinion from medical modalities. 10/10/2015 -- the issue with her air mattress has not yet been sorted out and the new problem seems to be a lot of odor from the wound VAC. 11/27/2015 -- the  patient was admitted to the hospital between July 23 and 10/31/2015. Her problems were sepsis, osteomyelitis of the pelvic bone and acute pyelonephritis. CT of the abdomen and pelvis was consistent with a left-sided pyelonephritis with hydronephrosis and also just showed new sclerosis of the posterior portion of the left anterior pubic ramus suggestive of periosteal reaction consistent with osteomyelitis. She was treated for the osteomyelitis with infectious disease consult recommending 6 weeks of IV antibiotics including vancomycin and Rocephin and the antibiotics were to go on until 12/10/2015. He was seen by Dr.  Thimmappa plastic surgery and Dr. Linus Salmons of infectious disease. She had a suprapubic catheter placed during the admission. CT scan done on 10/28/2015 showed specifically -- New sclerosis of the posterior portion of the left inferior pubic ramus with aggressive periosteal reaction, consistent with osteomyelitis, with adjacent soft tissue gas compatible with previously described decubitus ulcer. 12/12/2015 -- she was recently seen by Dr. Linus Salmons, who noted good improvement and CRP and ESR compared to before and he has stopped her antibiotic as per plans to finish on September 4. The patient was encouraged to continue with wound care and consider hyperbaric oxygen therapy. Today she tells me that she has consented to undergo hyperbaric oxygen therapy and we can start the paperwork. 01/02/2016 -- her PCP had gained about 3 years but she still persists in having problems during hyperbaric oxygen therapy with some discomfort in the ears. 01/09/16; pressure area with underlying osteomyelitis in the left buttock. Wound bed itself has some slight amount of grayish surface slough however I do not think any debridement was necessary. There is no exposed bone soft tissue appears stable. She is using a wound VAC 01/16/16; back for weekly wound review in conjunction with HBO. She has a deep wound over the left  initial tuberosity previously treated with 6 weeks of IV antibiotics for osteomyelitis. Wound bed looks reasonably healthy although the base of this is still precariously close to bone. She has been using a wound VAC. 01/23/2016 -- she has completed her course of antibiotics and this week the only new thing is her right great toe nail was avulsed and she has got an open wound over the nailbed. 01/31/16 she has completed her course of antibiotics. Her right great toenail avulsed last week and she's been using silver alginate for this as well. Still using a wound VAC to the substantial stage IV wound over the left ischial tuberosity 03/05/2016 -- the patient has had a opinion from the plastic surgery group at [facility] and details of this are not available yet but the patient's verbal report has been heard by me. Did not sound like there was any optimistic discussion regarding reconstruction and the net result would be to continue with the wound VAC application. I will await the official reports. Addendum: -- she was seen at [facility] by Dr. Tressa Busman. After a thorough review and from what I understand spending 45 minutes with the patient his assessment has been noted by me in detail and the management options were: 1. Continued pressure offloading and wound care versus operative procedures including wound excision 2. Soft tissue and bone sampling 3. If the wound gets larger wound closure would be done using a variety of plastic surgical techniques including but not limited to skin substitute, possible skin graft, local versus regional flaps, negative pressure dressing application. 4. He discussed with her details of flap surgery and the risks associated 5. He made a comment that since the patient was operated on by Dr. Leland Johns of [facility] plastic surgery unit in [place] the patient may continue to follow-up there for further evaluation  for surgical flap closure in the future. 03/19/2016 -- the patient continues to be rather depressed and frustrated with her lack of rapid progress in healing this wound especially because she thought after hyperbaric oxygen therapy the wound would heal extremely fast. She now understands that was not the implied benefit on wound care which was the recommendation for hyperbaric oxygen therapy. I have  had a lengthy discussion with the patient and her husband regarding her options: 1. Continue with collagen and wound VAC for the primary dressing and offloading and all supportive care. 2. See Dr. Iran Planas for possible placement of Acell or Integra in the OR. 3. get a second opinion from a wound care center and surrounding regions/counties 05/07/2016 -- Note from Dr. Celedonio Miyamoto, who noted that the patient has declined flap surgery. She has discussed application of A cell, and try a few applications to see how the wound progresses. She is also recommended that we could apply products here in the wound center, like Oasis. during her preop workup it was found that her hemoglobin A1c was 11% and she has now been diagnosed as having diabetes mellitus and has been put on appropriate treatment by her PCP 05/28/2016 -- tells me her blood sugars have been doing well and she has an appointment to see her PCP in the next couple of weeks to check her hemoglobin A1c. Other than that she continues to do well. 06/25/2016 -- have not seen her back for the last month but she says her health has been about the same and she has an appointment to check the A1c next week 09/10/16 ---- was seen by Dr. Celedonio Miyamoto -- who applied Acell and saw her back in follow-up. She has recommended silver alginate to the wound every other day and cover with foam. If no significant drainage could transition to collagen every other day. She recommended discontinuing wound VAC. There were [name], [name] ([id number])  [id number] Page 4 of 19 no plans to repeat application of Acell. The patient expressed that her husband could do the wound care as going to the Wound Ctr., would cost several $100 for each visit. 10/21/2016 -- her insurance company is getting her new mattress and she is pleased about that. Other than that she has been doing dressings with PolyMem Silver and has been doing very well 02/18/2017 -- she has gone through several changes of her mattress and has not been pleased with any of them. The ventricles are still working on trying to fit her with the appropriate low air-loss mattress. She has a new wound on the gluteal area which is clearly separated from the original wound. 03/25/17-she is here in follow-up evaluation for her left ischialpressure ulcer. She remains unsatisfied with her pressure mattress. She admits to sitting multiple hours a day, in the bed. We have discussed offloading options. The wound does not appear infected. Nutrition does not appear to be a concern. Will follow-up in 4 weeks, if wound continues to be stalled may consider x-ray to evaluate for refractory osteomyelitis. 04/21/17; this is a patient that I don't know all that well. She has a chronic wound which at one point had underlying osteomyelitis in the left ischial tuberosity. This is a stage IV pressure ulcer. Over the last 3 months she has a stage II wound inferiorly to the original wound. The last time she was here her dressing was changed to silver collagen although the patient's husband who changes the dressing said that the collagen stuck to the wound and remove skin from the superficial area therefore he switched back to [place] 05/13/17; this is a patient we've been following for a left ischial tuberosity wound which was stage IV at one point had underlying osteomyelitis. Over the last several months she's had a stage II wound just inferior and medial to the related to the wound.  According to her  husband he is using Endoform layer with collagen although this is not what I had last time. According to her husband they are using Elgie Congo with collagen although I don't quite know how that started. She was hospitalized from 1/20 through 04/30/16. This was related to a UTI. Her blood cultures were negative, urine culture showed multiple species. She did have a CT scan of the abdomen and pelvis which documented chronic osteomyelitis in the area of the wound inflammatory markers were unremarkable. She has had prior knowledge of osteomyelitis. It looks as though she received IV antibiotics in 2017 and was treated with a course of hyperbaric oxygen. 05/28/17; the wound over the left ischial tuberosity is deeper today and abuts clearly on bone. Nursing intake reported drainage. I therefore culture of the wound. The more superficial area just below this looks about the same. They once again complained that there are mattress cover is not working although apparently [facility] is been noted to see this many times in the report is that the device is functional 06/18/17; the patient had a probing area on the left ischial tuberosity that was draining purulent fluid last time. This also clearly seemed to have open bone. Culture I did showed pansensitive pseudomonas including third generation cephalosporins. I treated this with cefdinir 300 twice a day for 10 days and things seem to have improved. She has a more superficial wound just underneath this area. Amazingly she has a new air mattress through [facility]. I think they gave this to her as a parking give. In any case this now works according to the patient may have something to do with why the areas are looking better. 07/09/17; the patient has a probing area in the left ischial tuberosity that still has some depth. However this is contracted in terms of the wound orifice although the depth is still roughly the same. There  is no undermining. She also has the satellite wound which is more superficial. This appears to have a healthy surface we've been using silver collagen 08/06/17; the patient's wound is over the left ischial tuberosity and a satellite lesion just underneath this. The original wound was actually a deep stage 4 wound. We have made good progress in 2 months and there is no longer exposed bone here. 09/03/17; left ischial tuberosity actually appears to be quite healthy. I think we are making progress. No debridement is required. There is no surrounding erythema 10/01/17 I follow this patient monthly for her left ischial tuberosity wound. There is 2 areas the original area and a satellite area. The satellite area looks a lot better there is no surrounding erythema. Her husband relates that he is having trouble maintaining the dressing. This has to do with the soft tissue around it. He states he puts the collagen in but he cannot make sure that it stays in even with the ABD pads and tape that he is been using 10/29/17; patient arrives with a better looking noon today. Some of the satellite lesions have closed. using Prisma 11/26/17; the patient has a large cone-shaped area with the tip of the Cone deep within her buttock soft tissue. The walls of the Cone are epithelialized however the base is still open. The area at the base of this looks moist we've been using silver collagen. Will change to silver alginate 12/31/2017; the wound appears to have come in fairly nicely. Using silver alginate. There is no surrounding maceration or infection 01/28/18; there is still an open area  here over the left initial tuberosity. Base of this however looks healthy. There is no surrounding infection 02/25/18; the area of its open is over the left ischial tuberosity. The base of this is where the wound is. This is a large inverted cone-shaped area with the wound at the tip. Dimensions of the wound at the tip are improved. There is a  area of denuded skin about halfway towards the tip which her husband thinks may have happened today when he was bathing her. 04/20/17; the area is still open over the left initial tuberosity. 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 This is now completely closed over although it is closed over with a divot and skin over bone at the base of this. Continued aggressive offloading will be necessary. 12/30/2018 READMISSION This is a 71 year old woman with chronic paraplegia. I picked her up for her care from Dr. Con Memos in this clinic after he departed. She had a stage IV pressure wound over the left ischial tuberosity. She was treated twice for her underlying osteomyelitis and this I believe firstly in 2016 and again in 2017. There were some plans at some point for flap closure of this however she was discovered to have uncontrolled diabetes and I do not think this was ever accomplished. She ultimately healed over in this clinic and was discharged in May. She has a large cone-shaped indentation with the tip of this going towards the left ischial tuberosity. It is not an easy area to examine but at that time I thought all of this was epithelialized. Apparently there was a reopening here shortly after she left the clinic last time. She was admitted to hospital at the end of June for Klebsiella bacteremia felt to be secondary to UTI. A CT scan of the pelvis is listed below and there was initially some concern that she had underlying osteomyelitis although I believe she was seen by infectious disease and that was felt to be not the case: I do not see any new cultures or inflammatory markers IMPRESSION: 1. No CT evidence for acute intra-abdominal or pelvic abnormality. Large volume of stool throughout the colon. 2.  Enlarged fatty liver with fat sparing near the gallbladder fossa 3. Cortical scarring right kidney. Bilateral intrarenal stones without hydronephrosis. Thick-walled urinary bladder decompressed by suprapubic catheter 4. Deep left decubitus ulcer with underlying left ischial changes suggesting osteomyelitis. Her husband has been using silver collagen in the wound. She has not been systemically unwell no fever chills eating and drinking well. They rigorously offload this wound only getting up in the wheelchair when she is going to appointments the rest of the time she is in bed. 10/8; wound measures larger and she now has exposed bone. We have been using silver alginate 11/12 still using silver alginate. The patient saw Dr. Megan Salon of infectious disease. She was started on Augmentin 500 mg twice daily. She is due to follow-up with Dr. Megan Salon I believe next week. Lab work Dr. Megan Salon requested showed a sedimentation rate of 28 and CRP of 20 although her CRP 1 year ago was 18.8. Sedimentation rate 1 year ago was 11 basic metabolic panel showed a creatinine of 1.12 12/3; the patient followed up with Dr. Megan Salon yesterday. She is still on Augmentin twice daily. This was directed by Dr.  Megan Salon. The patient's inflammatory markers have improved which is gratifying. Her C-reactive protein was repeated yesterday and follow-up booked with infectious disease in January. In addition I have been getting secure text messages I think from palliative care through [facility] [employer]. I think they were hoping to provide services to the patient in her home. They could not get a hold of the primary physician and so they reached out to me on 2 separate occasions. 12/17; patient last saw Dr. Megan Salon on 12/2. She is finishing up with Augmentin. Her C-reactive protein was 20 on 10/21, 10.1 on 11/19 and 17 on 12/2. The [facility], OLSSON ([id number]) [id number] Page 5  of 19 wound itself still has depth and undermining. We are using Santyl with the backing wet-to-dry 04/27/2019. The wound is gradually clearing up in terms of the surface although it is not filled in that much. Still abuts right against bone 2/4; patient with a deep pressure ulcer over the left ischial tuberosity. I thought she was going to follow-up with infectious disease to follow her inflammatory markers although the patient states that they stated that they did not need to see her unless we felt it was necessary. I will need to check their notes. In any case we ordered moistened silver collagen back with wet-to-dry to fill in the depth of the wound although apparently [facility] sent silver alginate which they have been using since they were here the last time. Is obviously not what we ordered. 2/25. Not much change in this wound it is over the left ischial tuberosity recurrent wound. We have been using silver collagen with backing wet-to-dry. I think the wound is about the same. There is still some tunneling from about 10-12 o'clock over the ischial tuberosity itself 3/11; pressure ulcer over the left ischial tuberosity. Since she was last here the wound VAC was started and apparently going quite well. We are able to get the home health company that accepts [place] healthcare which is in itself sometimes problematic. There is been improvements in the wound the tunneling seems to be better and is contracted nicely 4/8; 1 month follow-up. Since she was last here we have been using silver collagen under a wound VAC. Some minor contraction I think in wound volume. She is cared for diligently by her husband including pressure relief, incontinence management, nutritional support etc. 6/1; this is almost a 40-monthfollow-up. She is been using silver collagen under wound VAC. Circular area over the left ischial tuberosity. She has been using silver collagen under wound VAC 7/8; 1 month follow-up. Silver  collagen under the VAC not really a lot of progress. Tissue at the base of the wound which is right against bone and the tissue next that this does not look completely viable. She is not currently on any antibiotics, she had underlying osteomyelitis I need to look this over 8/16; we are using silver collagen under wound VAC to the left ischial tuberosity wound. Comes in today with absolutely no change in surface area or depth. There is no exposed bone. I did look over her infectious disease notes as I said I would do last time. She last saw Dr. CMegan Salonin December 2020. She completed 6 weeks of Augmentin. This was in response to a bone culture I did showing methicillin susceptible staph aureus and Enterococcus. She was supposed to come back to see Dr. CMegan Salonat some point although they say that that appointment was canceled unless I chose to recommend return.  I think there was supposed to be follow-up with inflammatory markers but I cannot see that that was ever done. She has not been on antibiotics since 9/21; monthly follow-up. We received a call from home health nurse last evening to report green drainage coming out of the wound. Lab work I ordered last time showed a white count of 5.2 a sedimentation rate of 45 and a C-reactive protein of 25 however neither one of the 2 values are substantially different from her previous values in October 2020 or December 2020. Both are slightly higher but only marginally. Otherwise no new complaints from the patient or her husband 10/19; 1 month follow-up. PCR culture I did last time showed medium quantities of Pseudomonas lower quantities Klebsiella and Enterococcus faecalis group B strep and Peptostreptococcus. I gave her Augmentin for 2 weeks. I am not really sure of my choice of this I would not cover Pseudomonas. She is still having green drainage. Wound itself looks satisfactory there is not a lot of depth wound bed looks healthy 11/16; patient has  completed the antibiotics still using gentamicin and silver alginate on the wound. There is improvement in the surface area 12/21; in general the area on the buttock looks somewhat better. Surface looks healthy although I do not know that there is been much improvement in the wound volume. We have been using silver alginate and Hydrofera Blue. Less drainage. In passing the husband showed me an abrasion injury on the left anterior tibia. Covered in necrotic surface. He has noticed this for about a week and has been putting silver collagen on it. He is completely uncertain about how this happened 1/25; monthly follow-up. The area on the left buttock is about the same. This does not go to bone but a fairly deep wound surface of the wound is of questionable viability. The abrasion injury that they showed me last time apparently was closed out by home health because they thought it was healed but certainly is not although it is just about healed. As a result they haven't been applying anything to this area Finally I did discuss with the patient and her husband the idea of an advanced treatment product to try and get a proper base to this wound I was thinking of Puraply however actually the patient points out that her co-pay for coming to visit [place] i.e. the facility, charge would be unaffordable if they have to, on a weekly basis 2/22; pressure ulcer on the left buttock appears deeper to me and abuts on the ischial tuberosity. I thought initially there was exposed bone but there is a rim of tissue over this area. She also has a superficial over the right anterior mid tibia. Been using silver collagen to these areas without much success. I have looked over the patient's past history with regards to the area on the left ischium. She did have underlying osteomyelitis here dating back I think to late 2020. She saw Dr. Megan Salon she received a 6-week course of oral antibiotics in response to a bone culture that I  did. This does not appear to be infected but it certainly has not been improving in terms of granulation. I do not believe she has had any recent imaging studies 3/29; 1 month follow-up. Pressure ulcer on the left buttock which she has been dealing with with for a number of years. She was treated for underlying osteomyelitis at 1.2 or 3 years ago I think with infectious disease help. She also had I think  a flap closure by Dr. Leland Johns and that lasted for about a year and then reopened. I have not been able to get this patient to progress towards healing although truthfully the wound is absolutely no worse. We have been using Hydrofera Blue 4/26; patient presents for 1 week follow-up. She has been using silver collagen to the area every other day. She has home health that comes out once a week to help with dressing changes as well. The patient is interested in trying a skin substitute over this area. She states she is trying to relieve pressure off of it most of the day. 5/24; patient presents for 1 month follow-up. She has been using silver collagen to the area every other day. She has no complaints today. She is interested in the skin substitute. She tries to leave relieve pressure off Her bottom however is not able to most of the day 6/14; using silver collagen to the area over the left ischial tuberosity. Wound does not appear to be doing particularly well. Open to bone 6/28; the patient patient presented last time with a marked deterioration. Depth probing all the way to bone. The bone itself did not look particularly viable. In spite of this the x-ray I did showed longstanding ulcer over the left ischial tuberosity with chronic bone involvement/reaction that was also seen by CT in 2020. Lab work did not show just active infection with a sed rate of 14 and a C-reactive protein of 7.1. Her comprehensive metabolic panel was normal including an albumin of 4.1 white count was 6 7/19; patient was here  3 weeks ago with a marked deterioration in her wound over the left ischial tuberosity. I ordered a CT scan of the area for 1 reason or another this just did not get done. It is now booked for 8 days from now. She was supposed to come back for bone culture and pathology. That did not happen either. We have been using silver collagen. As usual she is diligently looked after by her husband 8/24; 5-week follow-up. Since the patient was last here the biopsy that I did of her ischial tuberosity came back suggesting osteomyelitis. Culture showed strep. I started her on Augmentin. She was seen by infectious disease Dr. Lucianne Lei dam and the Augmentin was continued and she is still taking it. CT scan did not suggest anything other than chronic osteomyelitis without much change from her previous study. Her C-reactive protein was only 7 and sedimentation rate was 1.1. We are still using silver collagen to the wound. She has home health. Her husband is very diligent in her care for this reason I have never pursued a diverting colostomy. She would not agree to this surgery in any case. Finally we have discussed plastic surgery with him in the past and she is not interested in a myocutaneous flap. 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 Since she was last here she was in the ER last week with abdominal pain. She was found to be impacted however in the course of the review there somebody gave her some IV morphine and apparently she developed hives and blisters. There is still tense blisters on her plantar left fifth and fourth fingers 10/4; the patient has completed her Augmentin and is due to follow-up with Dr. Drucilla Schmidt in early November. Her wound today measures about the same but looks a little healthier in terms of granulation there is no exposed bone. I note that she was admitted to hospital for 2  days from 9/21 through 9/23 for delirium. She had received Versed for glaucoma surgery ultimately that was felt to be the  etiology. Her blood cultures were negative she had 30,000 colonies of Pseudomonas in her urine. She did receive broad-spectrum antibiotic therapy but ultimately her wound on the buttock was not felt to be the cause. As mentioned she has completed her antibiotics still using silver collagen 11/1; patient has completed her antibiotics for the underlying osteomyelitis. She apparently follows up with Dr. Drucilla Schmidt next week. The wound does not look too bad perhaps slightly narrower in terms of width but the depth is about the same. We have been using Hydrofera Blue. The patient talks to me about a wound VAC and made it sound as though she was recently on 1 although I do not see this. The other option is an advanced treatment product like Oasis but that means weekly trips into the clinic. She has previously said she does not want an attempt at plastic surgery XITLALY, AULT ([id number]) [id number] Page 6 of 19 11/15; the patient saw Dr. Drucilla Schmidt noted that her follow-up inflammatory markers normalized. She has completed her antibiotics. We are currently using Hydrofera Blue. Wound itself has some surface tissue over bone but certainly not a lot. I have looked back over her history. The patient has had recurrent osteomyelitis in this area as she is received prolonged courses of antibiotics. We did use a wound VAC for a prolonged period of time in 2021. We had some improvement especially in undermining areas but overall not a lot of measurable improvement. Once again I have tried to think about using advanced treatment products in this area something that would require them visiting the clinic very frequently and they did not seem to want to do this. Most recently we ran Brady however she would have a $160 per application co-pay 10/9; left ischial tuberosity. I do not see much difference in 3 weeks. There is no exposed bone. We are using Hydrofera Blue. Our intake nurse reports  some "greenish" drainage 12/21 left ischial tuberosity. Measuring slightly smaller in surface area. The wound still has some tissue over bone i.e. there is no exposed bone. No overt infection. We did a deep tissue culture last time for PCR. The major bacteria was Pseudomonas although there were medium titers of Enterococcus faecalis Klebsiella E. coli and Morganella as well as Peptostreptococcus. Keystone antibiotic include streptomycin and vancomycin they got this last weekend and used it twice 04/17/2021; no change in this wound. It does not have undermining however the surface is of it does not look particularly viable. We have been using topical antibiotic directed at a PCR culture as well as silver alginate. It is clear in talking to the patient and her husband that she does not offload this area adequately including spending all night on this with I think a level 2 surface on her bed. I have told her that this is not adequate to heal a wound like this.. 2/15; Oasis with a $323 co-pay per application. They are using silver alginate to the wound offloading as best they can according to her husband 06/19/2021: At her last visit, Dr. Dellia Nims changed her to silver collagen, but apparently they did not receive this until just last week. They have continued using silver alginate in the wound. Today, the wound is quite malodorous with necrotic slough. They ran out of [place] topical about 2 months ago. No new cultures have been taken.. 07/17/2021: At the last visit,  the wound was in pretty poor condition with a lot of necrotic slough and substantial malodorous drainage. I did take a new culture for PCR, but for some reason this was never resulted. Nonetheless, they did receive a renewal of their [employer] apparently it was communicated to them that they should use silver alginate rather than the silver collagen. Regardless, the wound is much cleaner at this visit and there is no odor. 07/26/2021: The  sacral/left ischial ulcer is fairly clean, but there was some greenish drainage appreciated on the dressing. They have been using topical Keystone with silver alginate. She has unfortunately developed a new ulcer on her right ischium. It is unstageable with a thick layer of eschar. It is malodorous. 08/14/2021: I took a new PCR culture at her last visit from the new wound that had opened on her right ischial tuberosity. This returned with a polymicrobial population. A new topical Keystone compound was formulated. They have been using this on her wounds along with silver alginate. I also prescribed a course of oral antibiotic, Augmentin and Levaquin to try and address the multiple species with various resistance these that grew out. She was able to take this for about 10 days, but it has started to make her nauseated and she is actually thrown up on occasion. Since she takes them at the same time, she is not sure which one is causing her issue. She has also been having a lot more drainage from her left ischial wound. Her husband says that he has not been using the zinc oxide as much because it is difficult to wash off when he bathes her. We have been using Santyl to the new wound under silver alginate and silver alginate to the left ischial wound. 09/11/2021: Unfortunately, there has been fairly substantial breakdown of both of her wounds. It sounds like the gel compound that the [employer] was being mixed in just leaked out everywhere and left the patient wet and macerated. There is worsening necrotic tissue and increased depth on the right ischial wound and the left one has extended further. They have not been using the zinc oxide as recommended. They contacted [facility] and were advised to simply apply the powder directly to the wounds. 10/09/2021: The right ischial wound has some necrotic tissue on the lateral aspect and into the base, but no purulent drainage and no surrounding erythema or induration. On  the left ischial wound, there is leathery nonviable skin extending laterally from the main wound. No significant odor or drainage from this site, either. 11/05/2021: The right ischial wound has more necrotic fibrinous tissue at the base. The leathery eschar on the left ischial wound is beginning to lift at one of the edges and may be amenable to debridement today. We have been using topical powdered mupirocin to her wounds with silver alginate. 12/03/2021: Significant improvement in both of the wounds. There is still some fibrinous slough on the right ischial wound. There is also a heavy layer of slough and fibrinous exudate on the left ischial wound as well as some soft light slough on the remainder of the wound surface. We have been using Santyl for enzymatic debridement between clinic visits. 12/31/2021: The right ischial wound is cleaner today with very minimal slough. The left ischial wound has an area of devitalized subcutaneous tissue and the most lateral extension of the wound. There is slough and biofilm on the surface, but overall the wounds are looking significantly better. We have been unable to obtain Santyl and  they have just been doing wet-to-dry dressing changes with good effect. 01/30/2022: The left ischial wound is very clean with just a little bit of slough on the posterolateral aspect of the wound. She has some pressure induced deep tissue injury on the right ischial wound, but otherwise both are quite clean. 03/06/2022: The low-air-loss mattress was finally delivered last week. Even over the short period of time she has been using it, her husband thinks that the wounds have demonstrated an improvement.. Certainly, since our last visit, they look better. All of the surfaces are robust with good granulation tissue. There is no further pressure-induced tissue damage visible. There is some slough accumulation on all of the wound surfaces. 04/03/2022: Both wounds appear improved this week.  The right ischial wound is smaller and has good granulation tissue with just a little slough on the surface. At the deepest portion of the left ischial wound, there is still some nonviable-looking muscle, and a little bit of pressure induced deep tissue injury, but overall the improvement is global. Electronic Signature(s) Signed: 04/03/2022 11:14:01 AM By: Fredirick Maudlin MD FACS Entered By: Fredirick Maudlin on 04/03/2022 11:14:01 Strength, [name] ([id number]) [id number] Page 7 of 19 -------------------------------------------------------------------------------- Physical Exam Details Patient Name: Date of Service: [name], [name] 04/03/2022 10:45 A M Medical Record Number: [id number] Patient Account Number: [id number] Date of Birth/Sex: Treating RN: 1950-04-15 (72 y.o. F) Primary Care Provider: [name] Other Clinician: Referring Provider: Treating Provider/Extender: Burgess Amor Weeks in Treatment: 170 Constitutional Slightly hypertensive. . . . No acute distress. Respiratory Normal work of breathing on room air. Notes 04/03/2022: Both wounds appear improved this week. The right ischial wound is smaller and has good granulation tissue with just a little slough on the surface. At the deepest portion of the left ischial wound, there is still some nonviable-looking muscle, and a little bit of pressure induced deep tissue injury, but overall the improvement is global. Electronic Signature(s) Signed: 04/03/2022 11:14:38 AM By: Fredirick Maudlin MD FACS Entered By: Fredirick Maudlin on 04/03/2022 11:14:37 -------------------------------------------------------------------------------- Physician Orders Details Patient Name: Date of Service: [name] Brunner. 04/03/2022 10:45 A M Medical Record Number: [id number] Patient Account Number: [id number] Date of Birth/Sex: Treating RN: 1950/09/30 (71 y.o. 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 - Dr. Celine Ahr - Room 1 ****HOYER**** Anesthetic Wound #12 Left Ischial Tuberosity (In clinic) Topical Lidocaine 4% applied to wound bed Bathing/ Shower/ Hygiene May shower and wash wound with soap and water. - with dressing changes Off-Loading A fluidized (Group 3) mattress - Medical Modalities ir Turn and reposition every 2 hours - *** Try to shift from side to side and shift to belly (if tolerated),****avoid sitting up in bed except for meals Additional Orders / Instructions Follow Nutritious Diet - protein shakes 2-3 times per day, Juven 2 times per day ROSALI, [name] ([id number]) [id number] Page 8 of 19 Home Health No change in wound care orders this week; continue Home Health for wound care. May utilize formulary equivalent dressing for wound treatment orders unless otherwise specified. - Mix Santyl and Gentamicin together and apply to wound beds. Moisten gauze with Dakins solution  and apply to wound beds. Cover with zetuvit borders. Dressing changes to be completed by Home Health on Monday / Wednesday / Friday except when patient has scheduled visit at [facility]. - increase visits to 3 times per week (spouse having difficulty with dressing changes) Other Home Health Orders/Instructions: - [facility] Wound Treatment Wound #12 - Ischial Tuberosity Wound Laterality: Left Cleanser: Soap and Water 1 x Per Day/30 Days Discharge Instructions: May shower and wash wound with dial antibacterial soap and water prior to dressing  change. Cleanser: Wound Cleanser 1 x Per Day/30 Days Discharge Instructions: Cleanse the wound with wound cleanser prior to applying a clean dressing using gauze sponges, not tissue or cotton balls. Peri-Wound Care: Skin Prep (Generic) 1 x Per Day/30 Days Discharge Instructions: Use skin prep as directed Topical: Gentamicin 1 x Per Day/30 Days Discharge Instructions: As directed by physician Prim Dressing: Dakin's Solution 0.25%, 16 (oz) 1 x Per Day/30 Days ary Discharge Instructions: Moisten gauze with Dakin's solution Prim Dressing: Santyl Ointment 1 x Per Day/30 Days ary Discharge Instructions: Apply nickel thick amount to wound bed as instructed Prim Dressing: Medline Woven Gauze Sponges 4x4 (in/in) 1 x Per Day/30 Days ary Discharge Instructions: moisten with saline and pack lightly into wound Secondary Dressing: Zetuvit Plus Silicone Border Dressing 5x5 (in/in) (Generic) 1 x Per Day/30 Days Discharge Instructions: Apply silicone border or ABD padover primary dressing as directed. Wound #15 - Ischium Wound Laterality: Right Cleanser: Soap and Water 1 x Per Day/30 Days Discharge Instructions: May shower and wash wound with dial antibacterial soap and water prior to dressing change. Cleanser: Wound Cleanser 1 x Per Day/30 Days Discharge Instructions: Cleanse the wound with wound cleanser prior to applying a clean dressing using gauze sponges, not tissue or cotton balls. Peri-Wound Care: Skin Prep (Generic) 1 x Per Day/30 Days Discharge Instructions: Use skin prep as directed Topical: Gentamicin 1 x Per Day/30 Days Discharge Instructions: As directed by physician Prim Dressing: Dakin's Solution 0.25%, 16 (oz) 1 x Per Day/30 Days ary Discharge Instructions: Moisten gauze with Dakin's solution Prim Dressing: Santyl Ointment 1 x Per Day/30 Days ary Discharge Instructions: Apply nickel thick amount to wound bed as instructed Prim Dressing: Medline Woven Gauze Sponges 4x4 (in/in) 1 x Per  Day/30 Days ary Discharge Instructions: moisten with saline and pack lightly into wound Secondary Dressing: Zetuvit Plus Silicone Border Dressing 5x5 (in/in) (Generic) 1 x Per Day/30 Days Discharge Instructions: Apply silicone border or ABD padover primary dressing as directed. Patient Medications llergies: Iodinated Contrast Media - IV Dye, aspirin, Sulfa (Sulfonamide Antibiotics) A Notifications Medication Indication Start End 04/03/2022 Santyl DOSE topical 250 unit/gram ointment - Apply to wound as directed with daily dressing changes 04/03/2022 gentamicin DOSE topical 0.1 % ointment - Apply to wound as directed with daily dressing changes Electronic Signature(s) Signed: 04/03/2022 12:31:25 PM By: Fredirick Maudlin MD FACS Previous Signature: 04/03/2022 11:25:11 AM Version By: Fredirick Maudlin MD FACS Entered By: Fredirick Maudlin on 04/03/2022 11:26:55 Behar, [name] ([id number]) [id number] Page 9 of 19 -------------------------------------------------------------------------------- Problem List Details Patient Name: Date of Service: ALANE, HANSSEN 04/03/2022 10:45 A M Medical Record Number: [id number] Patient Account Number: [id number] Date of Birth/Sex: Treating RN: Mar 24, 1951 (71 y.o. 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, stage 4 12/30/2018 No Yes L89.313 Pressure ulcer of right  buttock, stage 3 07/26/2021 No Yes E11.622 Type 2 diabetes mellitus with other skin ulcer 12/30/2018 No Yes Inactive Problems ICD-10 Code Description Active Date Inactive Date G82.20 Paraplegia, unspecified 12/30/2018 12/30/2018 M86.68 Other chronic osteomyelitis, other site 02/17/2019 02/17/2019 S80.811D Abrasion, right lower leg,  subsequent encounter 03/27/2020 03/27/2020 M86.68 Other chronic osteomyelitis, other site 11/28/2020 11/28/2020 L97.811 Non-pressure chronic ulcer of other part of right lower leg limited to breakdown of skin 03/27/2020 03/27/2020 Resolved Problems Electronic Signature(s) Signed: 04/03/2022 11:09:16 AM By: Fredirick Maudlin MD FACS Entered By: Fredirick Maudlin on 04/03/2022 11:09:15 -------------------------------------------------------------------------------- Progress Note Details Patient Name: Date of Service: [name] Brunner. 04/03/2022 10:45 A M Medical Record Number: [id number] Patient Account Number: [id number] Date of Birth/Sex: Treating RN: 1951-01-26 (71 y.o. [name], [name] [place] ([id number]) [id number] Page 10 of 19 Primary Care Provider: [name] Other Clinician: Referring Provider: Treating Provider/Extender: Nance Pew in Treatment: 170 Subjective Chief Complaint Information obtained from Patient She is here for follow up evaluation of left ischial pressure ulcer 12/30/2018; patient comes back for review of wounds in the same general area over the previously healed left ischial pressure ulcer History of Present Illness (HPI) The following HPI elements were documented for the patient's wound: Location: open ulceration of the left gluteal area, left heel and right ankle for about 5 months. Quality: Patient reports No Pain. Severity: Patient states wound(s) are getting worse. Duration: Patient has had the wound for > 5 months prior to seeking treatment at the wound center Context: The wound occurred when the patient has been paraplegic for about 3 years. Modifying Factors: Wound improving due to current treatment. Associated Signs and Symptoms: Patient reports having foul odor. this 71 year old patient who is known to have hypertension, hypothyroidism, breast cancer, chronic pain syndrome, paraplegia was noted to have a  left gluteal decubitus ulcer and was brought into the hospital. During the course of her hospitalization she was debrided in the operating room by ankle wound. Bone cultures were taken at that time but were negative but clinically she was treated for osteomyelitis because of the probing down to bone and open exposed bone. Home health has been giving her antibioticss which include vancomycin and Zosyn. The patient was a smoker until about 3 weeks ago and used to smoke about 10 cigarettes a day for a long while. 12/13/2014 - details of her operative note from 11/03/2014 were reviewed -- PROCEDURE: 1. Excisional debridement skin, subcutaneous, muscle left ischium 35 cm2 2. Excisional debridement skin, subcutaneous tissue left heel 27 cm2 3. Excisional debridement right ankle skin, subcutaneous, bone 30 cm2 01/24/2015 -- she has some issues with her wheelchair cushion but other than that is doing very well and has received Podus boots for her feet. 02/14/2015 -- she was using her old offloading boots and this seemed to have caused her a new pressure ulcer on the left posterior heel near the superior part just below the Achilles tendon. 03/07/2015 -- she has a new ulceration just to the left of the midline on her sacral region more on the left buttock and this has been there for Dr. Leland Johns and had all the wounds sharply debrided. The debridement was done for the left ischial wound, the left heel wound and the right about a week. 08/22/2015 -- was recently admitted to hospital between May 5 and 08/13/2015, with sepsis and leukocytosis due to a UTI. she was treated for a sepsis complicating Escherichia coli UTI and kidney stones. She also had metabolic  and careful up at the secondary to pyelonephritis. He received broad-spectrum antibiotics initially and then received Macrobid as per urology. She was sent home on nitrofurantoin. during her admission she had a CT scan which showed exposed left ischial  tuberosity without evidence of osteolysis. 09/12/2015-- the patient is having some issues with her air mattress and would like to get a opinion from medical modalities. 10/10/2015 -- the issue with her air mattress has not yet been sorted out and the new problem seems to be a lot of odor from the wound VAC. 11/27/2015 -- the patient was admitted to the hospital between July 23 and 10/31/2015. Her problems were sepsis, osteomyelitis of the pelvic bone and acute pyelonephritis. CT of the abdomen and pelvis was consistent with a left-sided pyelonephritis with hydronephrosis and also just showed new sclerosis of the posterior portion of the left anterior pubic ramus suggestive of periosteal reaction consistent with osteomyelitis. She was treated for the osteomyelitis with infectious disease consult recommending 6 weeks of IV antibiotics including vancomycin and Rocephin and the antibiotics were to go on until 12/10/2015. He was seen by Dr. Iran Planas plastic surgery and Dr. Linus Salmons of infectious disease. She had a suprapubic catheter placed during the admission. CT scan done on 10/28/2015 showed specifically -- New sclerosis of the posterior portion of the left inferior pubic ramus with aggressive periosteal reaction, consistent with osteomyelitis, with adjacent soft tissue gas compatible with previously described decubitus ulcer. 12/12/2015 -- she was recently seen by Dr. Linus Salmons, who noted good improvement and CRP and ESR compared to before and he has stopped her antibiotic as per plans to finish on September 4. The patient was encouraged to continue with wound care and consider hyperbaric oxygen therapy. Today she tells me that she has consented to undergo hyperbaric oxygen therapy and we can start the paperwork. 01/02/2016 -- her PCP had gained about 3 years but she still persists in having problems during hyperbaric oxygen therapy with some discomfort in the ears. 01/09/16; pressure area with underlying  osteomyelitis in the left buttock. Wound bed itself has some slight amount of grayish surface slough however I do not think any debridement was necessary. There is no exposed bone soft tissue appears stable. She is using a wound VAC 01/16/16; back for weekly wound review in conjunction with HBO. She has a deep wound over the left initial tuberosity previously treated with 6 weeks of IV antibiotics for osteomyelitis. Wound bed looks reasonably healthy although the base of this is still precariously close to bone. She has been using a wound VAC. 01/23/2016 -- she has completed her course of antibiotics and this week the only new thing is her right great toe nail was avulsed and she has got an open wound over the nailbed. 01/31/16 she has completed her course of antibiotics. Her right great toenail avulsed last week and she's been using silver alginate for this as well. Still using a wound VAC to the substantial stage IV wound over the left ischial tuberosity 03/05/2016 -- the patient has had a opinion from the plastic surgery group at [facility] and details of this are not available yet but the patient's verbal report has been heard by me. Did not sound like there was any optimistic discussion regarding reconstruction and the net result would be to continue with the wound VAC application. I will await the official reports. Addendum: -- she was seen at [facility] by Dr. Tressa Busman. After a thorough review  and from what I understand spending 45 minutes with the patient his assessment has been noted by me in detail and the management options were: 1. Continued pressure offloading and wound care versus operative procedures including wound excision 2. Soft tissue and bone sampling 3. If the wound gets larger wound closure would be done using a variety of plastic surgical techniques including but not limited to skin substitute, possible skin graft, local versus  regional flaps, negative pressure dressing application. 4. He discussed with her details of flap surgery and the risks associated 5. He made a comment that since the patient was operated on by Dr. Leland Johns of [facility] plastic surgery unit in [place] the patient may continue to follow-up there for further evaluation for surgical flap closure in the future. 03/19/2016 -- the patient continues to be rather depressed and frustrated with her lack of rapid progress in healing this wound especially because she thought after hyperbaric oxygen therapy the wound would heal extremely fast. She now understands that was not the implied benefit on wound care which was the recommendation for hyperbaric oxygen therapy. I have had a lengthy discussion with the patient and her husband regarding her options: 1. Continue with collagen and wound VAC for the primary dressing and offloading and all supportive care. 2. See Dr. Iran Planas for possible placement of Acell or Integra in the OR. 3. get a second opinion from a wound care center and surrounding regions/counties 05/07/2016 -- Note from Dr. Celedonio Miyamoto, who noted that the patient has declined flap surgery. She has discussed application of A cell, and try a few Jerkins, [name] ([id number]) [id number] Page 11 of 19 applications to see how the wound progresses. She is also recommended that we could apply products here in the wound center, like Oasis. during her preop workup it was found that her hemoglobin A1c was 11% and she has now been diagnosed as having diabetes mellitus and has been put on appropriate treatment by her PCP 05/28/2016 -- tells me her blood sugars have been doing well and she has an appointment to see her PCP in the next couple of weeks to check her hemoglobin A1c. Other than that she continues to do well. 06/25/2016 -- have not seen her back for the last month but she says her health has been  about the same and she has an appointment to check the A1c next week 09/10/16 ---- was seen by Dr. Celedonio Miyamoto -- who applied Acell and saw her back in follow-up. She has recommended silver alginate to the wound every other day and cover with foam. If no significant drainage could transition to collagen every other day. She recommended discontinuing wound VAC. There were no plans to repeat application of Acell. The patient expressed that her husband could do the wound care as going to the Wound Ctr., would cost several $100 for each visit. 10/21/2016 -- her insurance company is getting her new mattress and she is pleased about that. Other than that she has been doing dressings with PolyMem Silver and has been doing very well 02/18/2017 -- she has gone through several changes of her mattress and has not been pleased with any of them. The ventricles are still working on trying to fit her with the appropriate low air-loss mattress. She has a new wound on the gluteal area which is clearly separated from the original wound. 03/25/17-she is here in follow-up evaluation for her left ischialpressure ulcer. She remains unsatisfied with her pressure mattress. She  admits to sitting multiple hours a day, in the bed. We have discussed offloading options. The wound does not appear infected. Nutrition does not appear to be a concern. Will follow-up in 4 weeks, if wound continues to be stalled may consider x-ray to evaluate for refractory osteomyelitis. 04/21/17; this is a patient that I don't know all that well. She has a chronic wound which at one point had underlying osteomyelitis in the left ischial tuberosity. This is a stage IV pressure ulcer. Over the last 3 months she has a stage II wound inferiorly to the original wound. The last time she was here her dressing was changed to silver collagen although the patient's husband who changes the dressing said that the collagen stuck to the wound and remove skin  from the superficial area therefore he switched back to Mott 05/13/17; this is a patient we've been following for a left ischial tuberosity wound which was stage IV at one point had underlying osteomyelitis. Over the last several months she's had a stage II wound just inferior and medial to the related to the wound. According to her husband he is using Endoform layer with collagen although this is not what I had last time. According to her husband they are using Elgie Congo with collagen although I don't quite know how that started. She was hospitalized from 1/20 through 04/30/16. This was related to a UTI. Her blood cultures were negative, urine culture showed multiple species. She did have a CT scan of the abdomen and pelvis which documented chronic osteomyelitis in the area of the wound inflammatory markers were unremarkable. She has had prior knowledge of osteomyelitis. It looks as though she received IV antibiotics in 2017 and was treated with a course of hyperbaric oxygen. 05/28/17; the wound over the left ischial tuberosity is deeper today and abuts clearly on bone. Nursing intake reported drainage. I therefore culture of the wound. The more superficial area just below this looks about the same. They once again complained that there are mattress cover is not working although apparently [facility] is been noted to see this many times in the report is that the device is functional 06/18/17; the patient had a probing area on the left ischial tuberosity that was draining purulent fluid last time. This also clearly seemed to have open bone. Culture I did showed pansensitive pseudomonas including third generation cephalosporins. I treated this with cefdinir 300 twice a day for 10 days and things seem to have improved. She has a more superficial wound just underneath this area. Amazingly she has a new air mattress through [facility]. I think they gave this to her as a parking give.  In any case this now works according to the patient may have something to do with why the areas are looking better. 07/09/17; the patient has a probing area in the left ischial tuberosity that still has some depth. However this is contracted in terms of the wound orifice although the depth is still roughly the same. There is no undermining. ooShe also has the satellite wound which is more superficial. This appears to have a healthy surface we've been using silver collagen 08/06/17; the patient's wound is over the left ischial tuberosity and a satellite lesion just underneath this. The original wound was actually a deep stage 4 wound. We have made good progress in 2 months and there is no longer exposed bone here. 09/03/17; left ischial tuberosity actually appears to be quite healthy. I think we are  making progress. No debridement is required. There is no surrounding erythema 10/01/17 I follow this patient monthly for her left ischial tuberosity wound. There is 2 areas the original area and a satellite area. The satellite area looks a lot better there is no surrounding erythema. Her husband relates that he is having trouble maintaining the dressing. This has to do with the soft tissue around it. He states he puts the collagen in but he cannot make sure that it stays in even with the ABD pads and tape that he is been using 10/29/17; patient arrives with a better looking noon today. Some of the satellite lesions have closed. using Prisma 11/26/17; the patient has a large cone-shaped area with the tip of the Cone deep within her buttock soft tissue. The walls of the Cone are epithelialized however the base is still open. The area at the base of this looks moist we've been using silver collagen. Will change to silver alginate 12/31/2017; the wound appears to have come in fairly nicely. Using silver alginate. There is no surrounding maceration or infection 01/28/18; there is still an open area here over the left  initial tuberosity. Base of this however looks healthy. There is no surrounding infection 02/25/18; the area of its open is over the left ischial tuberosity. The base of this is where the wound is. This is a large inverted cone-shaped area with the wound at the tip. Dimensions of the wound at the tip are improved. There is a area of denuded skin about halfway towards the tip which her husband thinks may have happened today when he was bathing her. 04/20/17; the area is still open over the left initial tuberosity. 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 This is now completely closed over although it is closed over with a divot and skin over bone at the base of this. Continued aggressive offloading will be necessary. 12/30/2018 READMISSION This is a 71 year old woman with chronic paraplegia. I picked her up for her care from Dr. Con Memos in this clinic after he departed. She had a stage IV pressure wound over the left ischial tuberosity. She was treated twice for her underlying osteomyelitis and this I believe firstly in 2016 and again in 2017. There were some plans at some point for flap closure of this however she was discovered to have uncontrolled diabetes and I do not think this was ever accomplished. She ultimately healed over in this clinic and was discharged in May. She has a large cone-shaped indentation with the tip of this going towards the left ischial tuberosity. It is not an easy area to examine but at that time I thought all of this was epithelialized. Apparently there was a reopening here shortly after she left the clinic last time. She was admitted to hospital at the end of June for Klebsiella bacteremia felt to be secondary to UTI. A CT scan of the pelvis is listed below and there was initially  some concern that she had underlying osteomyelitis although I believe she was seen by infectious disease and that was felt to be not the case: I do not see any new cultures or inflammatory markers IMPRESSION: 1. No CT evidence for acute intra-abdominal or pelvic abnormality. Large volume of stool throughout the colon. 2. Enlarged fatty liver with fat  sparing near the gallbladder fossa 3. Cortical scarring right kidney. Bilateral intrarenal stones without hydronephrosis. Thick-walled urinary bladder decompressed by suprapubic catheter 4. Deep left decubitus ulcer with underlying left ischial changes suggesting osteomyelitis. Her husband has been using silver collagen in the wound. She has not been systemically unwell no fever chills eating and drinking well. They rigorously offload this wound only getting up in the wheelchair when she is going to appointments the rest of the time she is in bed. [name], [name] W ([id number]) [id number] Page 12 of 19 10/8; wound measures larger and she now has exposed bone. We have been using silver alginate 11/12 still using silver alginate. The patient saw Dr. Megan Salon of infectious disease. She was started on Augmentin 500 mg twice daily. She is due to follow-up with Dr. Megan Salon I believe next week. Lab work Dr. Megan Salon requested showed a sedimentation rate of 28 and CRP of 20 although her CRP 1 year ago was 18.8. Sedimentation rate 1 year ago was 11 basic metabolic panel showed a creatinine of 1.12 12/3; the patient followed up with Dr. Megan Salon yesterday. She is still on Augmentin twice daily. This was directed by Dr. Megan Salon. The patient's inflammatory markers have improved which is gratifying. Her C-reactive protein was repeated yesterday and follow-up booked with infectious disease in January. In addition I have been getting secure text messages I think from palliative care through [facility] [employer]. I  think they were hoping to provide services to the patient in her home. They could not get a hold of the primary physician and so they reached out to me on 2 separate occasions. 12/17; patient last saw Dr. Megan Salon on 12/2. She is finishing up with Augmentin. Her C-reactive protein was 20 on 10/21, 10.1 on 11/19 and 17 on 12/2. The wound itself still has depth and undermining. We are using Santyl with the backing wet-to-dry 04/27/2019. The wound is gradually clearing up in terms of the surface although it is not filled in that much. Still abuts right against bone 2/4; patient with a deep pressure ulcer over the left ischial tuberosity. I thought she was going to follow-up with infectious disease to follow her inflammatory markers although the patient states that they stated that they did not need to see her unless we felt it was necessary. I will need to check their notes. In any case we ordered moistened silver collagen back with wet-to-dry to fill in the depth of the wound although apparently [facility] sent silver alginate which they have been using since they were here the last time. Is obviously not what we ordered. 2/25. Not much change in this wound it is over the left ischial tuberosity recurrent wound. We have been using silver collagen with backing wet-to-dry. I think the wound is about the same. There is still some tunneling from about 10-12 o'clock over the ischial tuberosity itself 3/11; pressure ulcer over the left ischial tuberosity. Since she was last here the wound VAC was started and apparently going quite well. We are able to get the home health company that accepts [place] healthcare which is in itself sometimes problematic. There is been improvements in the wound the tunneling seems to be better and is contracted nicely 4/8; 1 month follow-up. Since she was last here we have been using silver collagen under a wound VAC. Some minor contraction I think in wound volume. She is cared for  diligently by her husband including pressure relief, incontinence management, nutritional support etc. 6/1;  this is almost a 3-monthfollow-up. She is been using silver collagen under wound VAC. Circular area over the left ischial tuberosity. She has been using silver collagen under wound VAC 7/8; 1 month follow-up. Silver collagen under the VAC not really a lot of progress. Tissue at the base of the wound which is right against bone and the tissue next that this does not look completely viable. She is not currently on any antibiotics, she had underlying osteomyelitis I need to look this over 8/16; we are using silver collagen under wound VAC to the left ischial tuberosity wound. Comes in today with absolutely no change in surface area or depth. There is no exposed bone. I did look over her infectious disease notes as I said I would do last time. She last saw Dr. CMegan Salonin December 2020. She completed 6 weeks of Augmentin. This was in response to a bone culture I did showing methicillin susceptible staph aureus and Enterococcus. She was supposed to come back to see Dr. CMegan Salonat some point although they say that that appointment was canceled unless I chose to recommend return. I think there was supposed to be follow-up with inflammatory markers but I cannot see that that was ever done. She has not been on antibiotics since 9/21; monthly follow-up. We received a call from home health nurse last evening to report green drainage coming out of the wound. Lab work I ordered last time showed a white count of 5.2 a sedimentation rate of 45 and a C-reactive protein of 25 however neither one of the 2 values are substantially different from her previous values in October 2020 or December 2020. Both are slightly higher but only marginally. Otherwise no new complaints from the patient or her husband 10/19; 1 month follow-up. PCR culture I did last time showed medium quantities of Pseudomonas lower quantities  Klebsiella and Enterococcus faecalis group B strep and Peptostreptococcus. I gave her Augmentin for 2 weeks. I am not really sure of my choice of this I would not cover Pseudomonas. She is still having green drainage. Wound itself looks satisfactory there is not a lot of depth wound bed looks healthy 11/16; patient has completed the antibiotics still using gentamicin and silver alginate on the wound. There is improvement in the surface area 12/21; in general the area on the buttock looks somewhat better. Surface looks healthy although I do not know that there is been much improvement in the wound volume. We have been using silver alginate and Hydrofera Blue. Less drainage. In passing the husband showed me an abrasion injury on the left anterior tibia. Covered in necrotic surface. He has noticed this for about a week and has been putting silver collagen on it. He is completely uncertain about how this happened 1/25; monthly follow-up. The area on the left buttock is about the same. This does not go to bone but a fairly deep wound surface of the wound is of questionable viability. The abrasion injury that they showed me last time apparently was closed out by home health because they thought it was healed but certainly is not although it is just about healed. As a result they haven't been applying anything to this area Finally I did discuss with the patient and her husband the idea of an advanced treatment product to try and get a proper base to this wound I was thinking of Puraply however actually the patient points out that her co-pay for coming to visit [email or web address] facility, charge  would be unaffordable if they have to, on a weekly basis 2/22; pressure ulcer on the left buttock appears deeper to me and abuts on the ischial tuberosity. I thought initially there was exposed bone but there is a rim of tissue over this area. She also has a superficial over the right anterior mid tibia. Been using silver  collagen to these areas without much success. I have looked over the patient's past history with regards to the area on the left ischium. She did have underlying osteomyelitis here dating back I think to late 2020. She saw Dr. Megan Salon she received a 6-week course of oral antibiotics in response to a bone culture that I did. This does not appear to be infected but it certainly has not been improving in terms of granulation. I do not believe she has had any recent imaging studies 3/29; 1 month follow-up. Pressure ulcer on the left buttock which she has been dealing with with for a number of years. She was treated for underlying osteomyelitis at 1.2 or 3 years ago I think with infectious disease help. She also had I think a flap closure by Dr. Leland Johns and that lasted for about a year and then reopened. I have not been able to get this patient to progress towards healing although truthfully the wound is absolutely no worse. We have been using Hydrofera Blue 4/26; patient presents for 1 week follow-up. She has been using silver collagen to the area every other day. She has home health that comes out once a week to help with dressing changes as well. The patient is interested in trying a skin substitute over this area. She states she is trying to relieve pressure off of it most of the day. 5/24; patient presents for 1 month follow-up. She has been using silver collagen to the area every other day. She has no complaints today. She is interested in the skin substitute. She tries to leave relieve pressure off Her bottom however is not able to most of the day 6/14; using silver collagen to the area over the left ischial tuberosity. Wound does not appear to be doing particularly well. Open to bone 6/28; the patient patient presented last time with a marked deterioration. Depth probing all the way to bone. The bone itself did not look particularly viable. In spite of this the x-ray I did showed longstanding  ulcer over the left ischial tuberosity with chronic bone involvement/reaction that was also seen by CT in 2020. Lab work did not show just active infection with a sed rate of 14 and a C-reactive protein of 7.1. Her comprehensive metabolic panel was normal including an albumin of 4.1 white count was 6 7/19; patient was here 3 weeks ago with a marked deterioration in her wound over the left ischial tuberosity. I ordered a CT scan of the area for 1 reason or another this just did not get done. It is now booked for 8 days from now. She was supposed to come back for bone culture and pathology. That did not happen either. We have been using silver collagen. As usual she is diligently looked after by her husband 8/24; 5-week follow-up. Since the patient was last here the biopsy that I did of her ischial tuberosity came back suggesting osteomyelitis. Culture showed strep. I started her on Augmentin. She was seen by infectious disease Dr. Lucianne Lei dam and the Augmentin was continued and she is still taking it. CT scan did not suggest anything other than chronic osteomyelitis  without much change from her previous study. Her C-reactive protein was only 7 and sedimentation rate was 1.1. We are still using silver collagen to the wound. She has home health. Her husband is very diligent in her care for this reason I have never pursued a diverting colostomy. She would not agree to this surgery in any case. Finally we have discussed plastic surgery with him in the past and she is not interested in a myocutaneous flap. 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 Since she was last here she was in the ER last week with abdominal pain. She was found to be impacted however in the course of the review there somebody gave her some IV morphine and apparently she developed hives and blisters. There is still tense blisters on her plantar left fifth and fourth fingers Marcinek, [name] ([id number])  [id number] Page 13 of 19 10/4; the patient has completed her Augmentin and is due to follow-up with Dr. Drucilla Schmidt in early November. Her wound today measures about the same but looks a little healthier in terms of granulation there is no exposed bone. I note that she was admitted to hospital for 2 days from 9/21 through 9/23 for delirium. She had received Versed for glaucoma surgery ultimately that was felt to be the etiology. Her blood cultures were negative she had 30,000 colonies of Pseudomonas in her urine. She did receive broad-spectrum antibiotic therapy but ultimately her wound on the buttock was not felt to be the cause. As mentioned she has completed her antibiotics still using silver collagen 11/1; patient has completed her antibiotics for the underlying osteomyelitis. She apparently follows up with Dr. Drucilla Schmidt next week. The wound does not look too bad perhaps slightly narrower in terms of width but the depth is about the same. We have been using Hydrofera Blue. The patient talks to me about a wound VAC and made it sound as though she was recently on 1 although I do not see this. The other option is an advanced treatment product like Oasis but that means weekly trips into the clinic. She has previously said she does not want an attempt at plastic surgery 11/15; the patient saw Dr. Drucilla Schmidt noted that her follow-up inflammatory markers normalized. She has completed her antibiotics. We are currently using Hydrofera Blue. Wound itself has some surface tissue over bone but certainly not a lot. I have looked back over her history. The patient has had recurrent osteomyelitis in this area as she is received prolonged courses of antibiotics. We did use a wound VAC for a prolonged period of time in 2021. We had some improvement especially in undermining areas but overall not a lot of measurable improvement. Once again I have tried to think about using advanced treatment  products in this area something that would require them visiting the clinic very frequently and they did not seem to want to do this. Most recently we ran [place] however she would have a $308 per application co-pay 65/7; left ischial tuberosity. I do not see much difference in 3 weeks. There is no exposed bone. We are using Hydrofera Blue. Our intake nurse reports some "greenish" drainage 12/21 left ischial tuberosity. Measuring slightly smaller in surface area. The wound still has some tissue over bone i.e. there is no exposed bone. No overt infection. We did a deep tissue culture last time for PCR. The major bacteria was Pseudomonas although there were medium titers of Enterococcus faecalis Klebsiella E. coli and Morganella as well  as Peptostreptococcus. Keystone antibiotic include streptomycin and vancomycin they got this last weekend and used it twice 04/17/2021; no change in this wound. It does not have undermining however the surface is of it does not look particularly viable. We have been using topical antibiotic directed at a PCR culture as well as silver alginate. It is clear in talking to the patient and her husband that she does not offload this area adequately including spending all night on this with I think a level 2 surface on her bed. I have told her that this is not adequate to heal a wound like this.. 2/15; Oasis with a $315 co-pay per application. They are using silver alginate to the wound offloading as best they can according to her husband 06/19/2021: At her last visit, Dr. Dellia Nims changed her to silver collagen, but apparently they did not receive this until just last week. They have continued using silver alginate in the wound. Today, the wound is quite malodorous with necrotic slough. They ran out of Kerrick topical about 2 months ago. No new cultures have been taken.. 07/17/2021: At the last visit, the wound was in pretty poor condition with a lot of necrotic slough and  substantial malodorous drainage. I did take a new culture for PCR, but for some reason this was never resulted. Nonetheless, they did receive a renewal of their [employer] apparently it was communicated to them that they should use silver alginate rather than the silver collagen. Regardless, the wound is much cleaner at this visit and there is no odor. 07/26/2021: The sacral/left ischial ulcer is fairly clean, but there was some greenish drainage appreciated on the dressing. They have been using topical Keystone with silver alginate. She has unfortunately developed a new ulcer on her right ischium. It is unstageable with a thick layer of eschar. It is malodorous. 08/14/2021: I took a new PCR culture at her last visit from the new wound that had opened on her right ischial tuberosity. This returned with a polymicrobial population. A new topical Keystone compound was formulated. They have been using this on her wounds along with silver alginate. I also prescribed a course of oral antibiotic, Augmentin and Levaquin to try and address the multiple species with various resistance these that grew out. She was able to take this for about 10 days, but it has started to make her nauseated and she is actually thrown up on occasion. Since she takes them at the same time, she is not sure which one is causing her issue. She has also been having a lot more drainage from her left ischial wound. Her husband says that he has not been using the zinc oxide as much because it is difficult to wash off when he bathes her. We have been using Santyl to the new wound under silver alginate and silver alginate to the left ischial wound. 09/11/2021: Unfortunately, there has been fairly substantial breakdown of both of her wounds. It sounds like the gel compound that the [employer] was being mixed in just leaked out everywhere and left the patient wet and macerated. There is worsening necrotic tissue and increased depth on the right  ischial wound and the left one has extended further. They have not been using the zinc oxide as recommended. They contacted [facility] and were advised to simply apply the powder directly to the wounds. 10/09/2021: The right ischial wound has some necrotic tissue on the lateral aspect and into the base, but no purulent drainage and no surrounding erythema  or induration. On the left ischial wound, there is leathery nonviable skin extending laterally from the main wound. No significant odor or drainage from this site, either. 11/05/2021: The right ischial wound has more necrotic fibrinous tissue at the base. The leathery eschar on the left ischial wound is beginning to lift at one of the edges and may be amenable to debridement today. We have been using topical powdered mupirocin to her wounds with silver alginate. 12/03/2021: Significant improvement in both of the wounds. There is still some fibrinous slough on the right ischial wound. There is also a heavy layer of slough and fibrinous exudate on the left ischial wound as well as some soft light slough on the remainder of the wound surface. We have been using Santyl for enzymatic debridement between clinic visits. 12/31/2021: The right ischial wound is cleaner today with very minimal slough. The left ischial wound has an area of devitalized subcutaneous tissue and the most lateral extension of the wound. There is slough and biofilm on the surface, but overall the wounds are looking significantly better. We have been unable to obtain Santyl and they have just been doing wet-to-dry dressing changes with good effect. 01/30/2022: The left ischial wound is very clean with just a little bit of slough on the posterolateral aspect of the wound. She has some pressure induced deep tissue injury on the right ischial wound, but otherwise both are quite clean. 03/06/2022: The low-air-loss mattress was finally delivered last week. Even over the short period of time she  has been using it, her husband thinks that the wounds have demonstrated an improvement.. Certainly, since our last visit, they look better. All of the surfaces are robust with good granulation tissue. There is no further pressure-induced tissue damage visible. There is some slough accumulation on all of the wound surfaces. 04/03/2022: Both wounds appear improved this week. The right ischial wound is smaller and has good granulation tissue with just a little slough on the surface. At the deepest portion of the left ischial wound, there is still some nonviable-looking muscle, and a little bit of pressure induced deep tissue injury, but overall the improvement is global. Patient History Information obtained from Patient. [name], [name] ([id number]) [id number] Page 24 of 46 Family History Unknown History, Cancer - Mother, Diabetes - Maternal Grandparents, Heart Disease - Mother, Hypertension - Mother, Thyroid Problems - Mother, No family history of Hereditary Spherocytosis, Kidney Disease, Lung Disease, Seizures, Stroke, Tuberculosis. Social History Former smoker - ended on 11/06/2014, Marital Status - Married, Alcohol Use - Never, Drug Use - No History, Caffeine Use - Daily - T soda. ea, Medical History Eyes Denies history of Cataracts, Glaucoma, Optic Neuritis Ear/Nose/Mouth/Throat Denies history of Chronic sinus problems/congestion, Middle ear problems Hematologic/Lymphatic Patient has history of Anemia Denies history of Hemophilia, Human Immunodeficiency Virus, Lymphedema, Sickle Cell Disease Respiratory Denies history of Aspiration, Asthma, Chronic Obstructive Pulmonary Disease (COPD), Pneumothorax, Sleep Apnea, Tuberculosis Cardiovascular Patient has history of Hypertension Denies history of Angina, Arrhythmia, Congestive Heart Failure, Coronary Artery Disease, Deep Vein Thrombosis, Hypotension, Myocardial Infarction, Peripheral Arterial Disease, Peripheral  Venous Disease, Phlebitis, Vasculitis Gastrointestinal Denies history of Cirrhosis , Colitis, Crohnoos, Hepatitis A, Hepatitis B, Hepatitis C Endocrine Patient has history of Type II Diabetes Denies history of Type I Diabetes Genitourinary Denies history of End Stage Renal Disease Immunological Denies history of Lupus Erythematosus, Raynaudoos, Scleroderma Integumentary (Skin) Denies history of History of Burn Musculoskeletal Patient has history of Osteoarthritis Denies history of Gout, Rheumatoid Arthritis, Osteomyelitis Neurologic Patient  has history of Dementia, Paraplegia Denies history of Neuropathy, Quadriplegia Oncologic Patient has history of Received Radiation - 2012 - right breast Psychiatric Denies history of Anorexia/bulimia, Confinement Anxiety Hospitalization/Surgery History - left knee replacement. - right breast lumpectomy. - cervical laminectomy with fusion. - hysterectomy. - tear duct surgery. - T and A. - UTI. - suprapubic cath placement. - UTI. Medical A Surgical History Notes nd Gastrointestinal constipation GERD Genitourinary UTI kidney stones neurogenic bladder Integumentary (Skin) left gluteal fold sacral Musculoskeletal cervical neuropathy osteomyelitis Psychiatric admitted for suicide risk on 12/02/2018 Objective Constitutional Slightly hypertensive. No acute distress. Vitals Time Taken: 10:45 AM, Height: 63 in, Weight: 185 lbs, BMI: 32.8, Temperature: 97.9 F, Pulse: 94 bpm, Respiratory Rate: 18 breaths/min, Blood Pressure: 143/73 mmHg. Respiratory Normal work of breathing on room air. General Notes: 04/03/2022: Both wounds appear improved this week. The right ischial wound is smaller and has good granulation tissue with just a little slough on the surface. At the deepest portion of the left ischial wound, there is still some nonviable-looking muscle, and a little bit of pressure induced deep tissue injury, but overall the improvement is  global. Integumentary (Hair, Skin) Wound #12 status is Open. Original cause of wound was Pressure Injury. The date acquired was: 09/06/2018. The wound has been in treatment 170 weeks. The wound is located on the Left Ischial Tuberosity. The wound measures 4cm length x 7.6cm width x 3cm depth; 23.876cm^2 area and 71.628cm^3 volume. There is Fat Layer (Subcutaneous Tissue) exposed. There is no tunneling noted, however, there is undermining starting at 6:00 and ending at 3:00 with a maximum distance of 1cm. There is a medium amount of serosanguineous drainage noted. The wound margin is well defined and not attached to the wound base. There is large (67-100%) red, pink granulation within the wound bed. There is a small (1-33%) amount of necrotic tissue within the wound bed including Adherent Slough. [name], [name] ([id number]) [id number] Page 15 of 19 The periwound skin appearance exhibited: Scarring, Dry/Scaly. The periwound skin appearance did not exhibit: Callus, Crepitus, Excoriation, Induration, Rash, Maceration, Atrophie Blanche, Cyanosis, Ecchymosis, Hemosiderin Staining, Mottled, Pallor, Rubor, Erythema. Periwound temperature was noted as No Abnormality. The periwound has tenderness on palpation. Wound #15 status is Open. Original cause of wound was Pressure Injury. The date acquired was: 07/21/2021. The wound has been in treatment 35 weeks. The wound is located on the Right Ischium. The wound measures 1.4cm length x 1.3cm width x 1.7cm depth; 1.429cm^2 area and 2.43cm^3 volume. There is Fat Layer (Subcutaneous Tissue) exposed. There is no tunneling or undermining noted. There is a medium amount of serosanguineous drainage noted. The wound margin is distinct with the outline attached to the wound base. There is large (67-100%) red granulation within the wound bed. There is a small (1-33%) amount of necrotic tissue within the wound bed including Adherent Slough. The  periwound skin appearance exhibited: Scarring. The periwound skin appearance did not exhibit: Callus, Crepitus, Excoriation, Induration, Rash, Dry/Scaly, Maceration, Atrophie Blanche, Cyanosis, Ecchymosis, Hemosiderin Staining, Mottled, Pallor, Rubor, Erythema. Periwound temperature was noted as No Abnormality. Assessment Active Problems ICD-10 Pressure ulcer of left buttock, stage 4 Pressure ulcer of right buttock, stage 3 Type 2 diabetes mellitus with other skin ulcer Procedures Wound #12 Pre-procedure diagnosis of Wound #12 is a Pressure Ulcer located on the Left Ischial Tuberosity . There was a Excisional Skin/Subcutaneous Tissue/Muscle Debridement with a total area of 16 sq cm performed by Fredirick Maudlin, MD. With the following instrument(s): Curette to remove  Non-Viable tissue/material. Material removed includes Muscle, Subcutaneous Tissue, and Slough. No specimens were taken. A time out was conducted at 11:05, prior to the start of the procedure. A Minimum amount of bleeding was controlled with Pressure. The procedure was tolerated well. Post Debridement Measurements: 4cm length x 7.6cm width x 3cm depth; 71.628cm^3 volume. Post debridement Stage noted as Category/Stage IV. Character of Wound/Ulcer Post Debridement is improved. Post procedure Diagnosis Wound #12: Same as Pre-Procedure General Notes: scribed for Dr. Celine Ahr by Adline Peals, RN. Wound #15 Pre-procedure diagnosis of Wound #15 is a Pressure Ulcer located on the Right Ischium . There was a Selective/Open Wound Non-Viable Tissue Debridement with a total area of 1.82 sq cm performed by Fredirick Maudlin, MD. With the following instrument(s): Curette to remove Non-Viable tissue/material. Material removed includes [facility]. No specimens were taken. A time out was conducted at 11:05, prior to the start of the procedure. A Minimum amount of bleeding was controlled with Pressure. The procedure was tolerated well. Post  Debridement Measurements: 1.4cm length x 1.3cm width x 1.7cm depth; 2.43cm^3 volume. Post debridement Stage noted as Category/Stage III. Character of Wound/Ulcer Post Debridement is improved. Post procedure Diagnosis Wound #15: Same as Pre-Procedure General Notes: scribed for Dr. Celine Ahr by Adline Peals, RN. Plan Follow-up Appointments: Return appointment in 1 month. - Dr. Celine Ahr - Room 1 ****[facility]**** Anesthetic: Wound #12 Left Ischial Tuberosity: (In clinic) Topical Lidocaine 4% applied to wound bed Bathing/ Shower/ Hygiene: May shower and wash wound with soap and water. - with dressing changes Off-Loading: Air fluidized (Group 3) mattress - Medical Modalities Turn and reposition every 2 hours - *** Try to shift from side to side and shift to belly (if tolerated),****avoid sitting up in bed except for meals Additional Orders / Instructions: Follow Nutritious Diet - protein shakes 2-3 times per day, Juven 2 times per day Home Health: No change in wound care orders this week; continue Home Health for wound care. May utilize formulary equivalent dressing for wound treatment orders unless otherwise specified. - Mix Santyl and Gentamicin together and apply to wound beds. Moisten gauze with Dakins solution and apply to wound beds. Cover with zetuvit borders. Dressing changes to be completed by [place] on Monday / Wednesday / Friday except when patient has scheduled visit at [facility]. - increase visits to 3 times per week (spouse having difficulty with dressing changes) Other Home Health Orders/Instructions: [facility] The following medication(s) was prescribed: Santyl topical 250 unit/gram ointment Apply to wound as directed with daily dressing changes starting 04/03/2022 gentamicin topical 0.1 % ointment Apply to wound as directed with daily dressing changes starting 04/03/2022 WOUND #12: - Ischial Tuberosity Wound Laterality: Left Cleanser: Soap and Water 1 x Per Day/30  Days Discharge Instructions: May shower and wash wound with dial antibacterial soap and water prior to dressing change. Cleanser: Wound Cleanser 1 x Per Day/30 Days Discharge Instructions: Cleanse the wound with wound cleanser prior to applying a clean dressing using gauze sponges, not tissue or cotton balls. Peri-Wound Care: Skin Prep (Generic) 1 x Per Day/30 Days Discharge Instructions: Use skin prep as directed Topical: Gentamicin 1 x Per Day/30 Days Discharge Instructions: As directed by physician [name] ([id number]) [id number] Page 16 of 19 Prim Dressing: Dakin's Solution 0.25%, 16 (oz) 1 x Per Day/30 Days ary Discharge Instructions: Moisten gauze with Dakin's solution Prim Dressing: Santyl Ointment 1 x Per Day/30 Days ary Discharge Instructions: Apply nickel thick amount to wound bed as instructed Prim Dressing:  Medline Woven Gauze Sponges 4x4 (in/in) 1 x Per Day/30 Days ary Discharge Instructions: moisten with saline and pack lightly into wound Secondary Dressing: Zetuvit Plus Silicone Border Dressing 5x5 (in/in) (Generic) 1 x Per Day/30 Days Discharge Instructions: Apply silicone border or ABD padover primary dressing as directed. WOUND #15: - Ischium Wound Laterality: Right Cleanser: Soap and Water 1 x Per Day/30 Days Discharge Instructions: May shower and wash wound with dial antibacterial soap and water prior to dressing change. Cleanser: Wound Cleanser 1 x Per Day/30 Days Discharge Instructions: Cleanse the wound with wound cleanser prior to applying a clean dressing using gauze sponges, not tissue or cotton balls. Peri-Wound Care: Skin Prep (Generic) 1 x Per Day/30 Days Discharge Instructions: Use skin prep as directed Topical: Gentamicin 1 x Per Day/30 Days Discharge Instructions: As directed by physician Prim Dressing: Dakin's Solution 0.25%, 16 (oz) 1 x Per Day/30 Days ary Discharge Instructions: Moisten gauze with Dakin's  solution Prim Dressing: Santyl Ointment 1 x Per Day/30 Days ary Discharge Instructions: Apply nickel thick amount to wound bed as instructed Prim Dressing: Medline Woven Gauze Sponges 4x4 (in/in) 1 x Per Day/30 Days ary Discharge Instructions: moisten with saline and pack lightly into wound Secondary Dressing: Zetuvit Plus Silicone Border Dressing 5x5 (in/in) (Generic) 1 x Per Day/30 Days Discharge Instructions: Apply silicone border or ABD padover primary dressing as directed. 04/03/2022: Both wounds appear improved this week. The right ischial wound is smaller and has good granulation tissue with just a little slough on the surface. At the deepest portion of the left ischial wound, there is still some nonviable-looking muscle, and a little bit of pressure induced deep tissue injury, but overall the improvement is global. I used a curette to debride slough from the right ischial wound and slough, nonviable subcutaneous tissue, and devascularize muscle from the left ischial wound. We will continue the combination of Santyl and gentamicin backed with Dakin's moistened gauze on the left and just Dakin's moistened gauze on the right. Continue to offload and use the low-air-loss mattress bed at home. Follow-up in 1 month. Electronic Signature(s) Signed: 04/03/2022 11:29:02 AM By: Fredirick Maudlin MD FACS Entered By: Fredirick Maudlin on 04/03/2022 11:29:02 -------------------------------------------------------------------------------- HxROS Details Patient Name: Date of Service: [name], [name]. 04/03/2022 10:45 A M Medical Record Number: [id number] Patient Account Number: [id number] Date of Birth/Sex: Treating RN: 17-Dec-1950 (71 y.o. F) Primary Care Provider: [name] Other Clinician: Referring Provider: Treating Provider/Extender: Nance Pew in Treatment: 170 Information Obtained From Patient Eyes Medical History: Negative for: Cataracts; Glaucoma; Optic  Neuritis Ear/Nose/Mouth/Throat Medical History: Negative for: Chronic sinus problems/congestion; Middle ear problems Hematologic/Lymphatic Medical History: Positive for: Anemia Negative for: Hemophilia; Human Immunodeficiency Virus; Lymphedema; Sickle Cell Disease Respiratory Medical HistoryHENRY, [name] ([id number]) [id number] Page 17 of 19 Negative for: Aspiration; Asthma; Chronic Obstructive Pulmonary Disease (COPD); Pneumothorax; Sleep Apnea; Tuberculosis Cardiovascular Medical History: Positive for: Hypertension Negative for: Angina; Arrhythmia; Congestive Heart Failure; Coronary Artery Disease; Deep Vein Thrombosis; Hypotension; Myocardial Infarction; Peripheral Arterial Disease; Peripheral Venous Disease; Phlebitis; Vasculitis Gastrointestinal Medical History: Negative for: Cirrhosis ; Colitis; Crohns; Hepatitis A; Hepatitis B; Hepatitis C Past Medical History Notes: constipation GERD Endocrine Medical History: Positive for: Type II Diabetes Negative for: Type I Diabetes Time with diabetes: 1 year Treated with: Oral agents Blood sugar tested every day: No Genitourinary Medical History: Negative for: End Stage Renal Disease Past Medical History Notes: UTI kidney stones neurogenic bladder Immunological Medical History: Negative for: Lupus Erythematosus; Raynauds; Scleroderma  Integumentary (Skin) Medical History: Negative for: History of Burn Past Medical History Notes: left gluteal fold sacral Musculoskeletal Medical History: Positive for: Osteoarthritis Negative for: Gout; Rheumatoid Arthritis; Osteomyelitis Past Medical History Notes: cervical neuropathy osteomyelitis Neurologic Medical History: Positive for: Dementia; Paraplegia Negative for: Neuropathy; Quadriplegia Oncologic Medical History: Positive for: Received Radiation - 2012 - right breast Psychiatric Medical History: Negative for: Anorexia/bulimia;  Confinement Anxiety Past Medical History Notes: admitted for suicide risk on 12/02/2018 Immunizations Pneumococcal Vaccine: Received Pneumococcal Vaccination: Yes Received Pneumococcal Vaccination On or After 60th Birthday: No [name], [name] ([id number]) [id number] Page 18 of 19 Implantable Devices None Hospitalization / Surgery History Type of Hospitalization/Surgery left knee replacement right breast lumpectomy cervical laminectomy with fusion hysterectomy tear duct surgery Tand A UTI suprapubic cath placement UTI Family and Social History Unknown History: Yes; Cancer: Yes - Mother; Diabetes: Yes - Maternal Grandparents; Heart Disease: Yes - Mother; Hereditary Spherocytosis: No; Hypertension: Yes - Mother; Kidney Disease: No; Lung Disease: No; Seizures: No; Stroke: No; Thyroid Problems: Yes - Mother; Tuberculosis: No; Former smoker - ended on 11/06/2014; Marital Status - Married; Alcohol Use: Never; Drug Use: No History; Caffeine Use: Daily - T soda; Financial Concerns: No; ea, Food, Clothing or Shelter Needs: No; Support System Lacking: No; Transportation Concerns: No Electronic Signature(s) Signed: 04/03/2022 12:31:25 PM By: Fredirick Maudlin MD FACS Entered By: Fredirick Maudlin on 04/03/2022 11:14:09 -------------------------------------------------------------------------------- SuperBill Details Patient Name: Date of Service: [name] Brunner 04/03/2022 Medical Record Number: [id number] Patient Account Number: [id number] Date of Birth/Sex: Treating RN: 1950/11/28 (71 y.o. F) Primary Care Provider: [name] Other Clinician: Referring Provider: Treating Provider/Extender: Burgess Amor Weeks in Treatment: 170 Diagnosis Coding ICD-10 Codes Code Description [id number] Pressure ulcer of left buttock, stage 4 L89.313 Pressure ulcer of right buttock, stage 3 E11.622 Type 2 diabetes mellitus with other skin ulcer Facility  Procedures : The patient participates with Medicare or their insurance follows the Medicare Facility Guidelines: CPT4 Code Description Modifier Quantity [id number] 11043 - DEB MUSC/FASCIA 20 SQ CM/< 1 ICD-10 Diagnosis Description 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,  stage 4 : The patient participates with Medicare or their insurance follows the Medicare Facility Guidelines: [id number] - DEBRIDE WOUND 1ST 20 SQ CM OR < 1 ICD-10 Diagnosis Description L89.313 Pressure ulcer of right buttock, stage 3 Physician Procedures : CPT4 Code Description Modifier [id number] - WC PHYS LEVEL 4 - EST PT 25 ICD-10 Diagnosis Description 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, stage 4 L89.313 Pressure ulcer of right buttock, stage 3 E11.622 Type 2 diabetes mellitus with other skin  ulcer Quantity: 1 : [id number] 11043 - WC PHYS DEBR MUSCLE/FASCIA 20 SQ CM Rebuck, Dorisann M ([id number]) [id number] ICD-10 Diagnosis Description 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, stage 4 Quantity: 1 df Page 19 of 19 : [id number] 71855 - WC PHYS DEBR WO ANESTH 20 SQ CM 1 ICD-10 Diagnosis Description L89.313 Pressure ulcer of right buttock, stage 3 Quantity: Electronic Signature(s) Signed: 04/03/2022 11:30:41 AM By: Fredirick Maudlin MD FACS Entered By: Fredirick Maudlin on 04/03/2022 11:30:40

## 2022-04-03 NOTE — Progress Notes (Signed)
MYLES, MALLICOAT N ([id number]) [id number] Page 1 of 9 Visit Report for 04/03/2022 Arrival Information Details Patient Name: Date of Service: [name], [name] 04/03/2022 10:45 A M Medical Record Number: [id number] Patient Account Number: [id number] Date of Birth/Sex: Treating RN: 01/31/1951 (71 y.o. [name] Primary Care Cristal Qadir: [name] Other Clinician: Referring Mardella Nuckles: Treating Eartha Vonbehren/Extender: Nance Pew in Treatment: 41 Visit Information History Since Last Visit Added or deleted any medications: No Patient Arrived: Wheel Chair Any new allergies or adverse reactions: No Arrival Time: 10:42 Had a fall or experienced change in No Accompanied By: husband activities of daily living that may affect Transfer Assistance: Harrel Lemon Lift risk of falls: Patient Identification Verified: Yes Signs or symptoms of abuse/neglect since last visito No Secondary Verification Process Completed: Yes Hospitalized since last visit: No Patient Requires Transmission-Based Precautions: No Implantable device outside of the clinic excluding No Patient Has Alerts: No cellular tissue based products placed in the center since last visit: Has Dressing in Place as Prescribed: Yes Pain Present Now: No Electronic Signature(s) Signed: 04/03/2022 4:31:58 PM By: Adline Peals Entered By: Adline Peals on 04/03/2022 11:08:46 -------------------------------------------------------------------------------- Encounter Discharge Information Details Patient Name: Date of Service: [name]. 04/03/2022 10:45 A M Medical Record Number: [id number] Patient Account Number: [id number] Date of Birth/Sex: Treating RN: 05/30/1950 (71 y.o. [name] Primary Care Erick Murin: [name] Other Clinician: Referring Minetta Krisher: Treating Belford Pascucci/Extender: Nance Pew in Treatment: 170 Encounter Discharge  Information Items Post Procedure Vitals Discharge Condition: Stable Temperature (F): 97.9 Ambulatory Status: Wheelchair Pulse (bpm): 94 Discharge Destination: Home Respiratory Rate (breaths/min): 18 Transportation: Private Auto Blood Pressure (mmHg): 143/73 Accompanied By: husband Schedule Follow-up Appointment: Yes Clinical Summary of Care: Patient Declined Electronic Signature(s) Signed: 04/03/2022 4:31:58 PM By: Adline Peals Entered By: Adline Peals on 04/03/2022 11:08:36 [name], [name] ([id number]) [id number] Page 2 of 9 -------------------------------------------------------------------------------- Lower Extremity Assessment Details Patient Name: Date of Service: [name], [name] 04/03/2022 10:45 A M Medical Record Number: [id number] Patient Account Number: [id number] Date of Birth/Sex: Treating RN: 1951-03-27 (71 y.o. [name] Primary Care Kori Goins: [name] Other Clinician: Referring Canuto Kingston: Treating Ladarien Beeks/Extender: Burgess Amor Weeks in Treatment: 170 Electronic Signature(s) Signed: 04/03/2022 4:31:58 PM By: Sabas Sous By: Adline Peals on 04/03/2022 10:46:00 -------------------------------------------------------------------------------- Multi Wound Chart Details Patient Name: Date of Service: [name], [name] 04/03/2022 10:45 A M Medical Record Number: [id number] Patient Account Number: [id number] Date of Birth/Sex: Treating RN: 05-Jun-1950 (71 y.o. F) Primary Care Reham Slabaugh: [name] Other Clinician: Referring Abhijay Morriss: Treating Elward Nocera/Extender: Nance Pew in Treatment: 170 Vital Signs Height(in): 98 Pulse(bpm): 21 Weight(lbs): 185 Blood Pressure(mmHg): 143/73 Body Mass Index(BMI): 32.8 Temperature(F): 97.9 Respiratory Rate(breaths/min): 18 [12:Photos:] [N/A:N/A] Left Ischial Tuberosity Right Ischium N/A Wound Location: Pressure  Injury Pressure Injury N/A Wounding Event: Pressure Ulcer Pressure Ulcer N/A Primary Etiology: Anemia, Hypertension, Type II Anemia, Hypertension, Type II N/A Comorbid History: Diabetes, Osteoarthritis, Dementia, Diabetes, Osteoarthritis, Dementia, Paraplegia, Received Radiation Paraplegia, Received Radiation 09/06/2018 07/21/2021 N/A Date Acquired: 170 3 N/A Weeks of Treatment: Open Open N/A Wound Status: No No N/A Wound Recurrence: 4x7.6x3 1.4x1.3x1.7 N/A Measurements L x W x D (cm) 23.876 1.429 N/A A (cm) : rea 71.628 2.43 N/A Volume (cm) : -10016.90% 91.40% N/A % Reduction in A rea: -33686.80% -46.30% N/A % Reduction in Volume: 6 Starting Position 1 (o'clock): 3 Ending Position 1 (o'clock): 1 Maximum Distance 1 (cm): Yes No N/A Undermining:  Category/Stage IV Category/Stage III N/A Classification: Medium Medium N/A Exudate A mount: Serosanguineous Serosanguineous N/A Exudate Type: [name], [name] ([id number]) [phone].pdf Page 3 of 9 red, brown red, brown N/A Exudate Color: Well defined, not attached Distinct, outline attached N/A Wound Margin: Large (67-100%) Large (67-100%) N/A Granulation Amount: Red, Pink Red N/A Granulation Quality: Small (1-33%) Small (1-33%) N/A Necrotic Amount: Fat Layer (Subcutaneous Tissue): Yes Fat Layer (Subcutaneous Tissue): Yes N/A Exposed Structures: Fascia: No Fascia: No Tendon: No Tendon: No Muscle: No Muscle: No Joint: No Joint: No Bone: No Bone: No Small (1-33%) Medium (34-66%) N/A Epithelialization: Debridement - Excisional Debridement - Selective/Open Wound N/A Debridement: Pre-procedure Verification/Time Out 11:05 11:05 N/A Taken: Muscle, Subcutaneous, [facility] N/A Tissue Debrided: Skin/Subcutaneous Tissue/Muscle Non-Viable Tissue N/A Level: 16 1.82 N/A Debridement A (sq cm): rea Curette Curette N/A Instrument: Minimum Minimum N/A Bleeding: Pressure Pressure  N/A Hemostasis A chieved: Procedure was tolerated well Procedure was tolerated well N/A Debridement Treatment Response: 4x7.6x3 1.4x1.3x1.7 N/A Post Debridement Measurements L x W x D (cm) 71.628 2.43 N/A Post Debridement Volume: (cm) Category/Stage IV Category/Stage III N/A Post Debridement Stage: Scarring: Yes Scarring: Yes N/A Periwound Skin Texture: Excoriation: No Excoriation: No Induration: No Induration: No Callus: No Callus: No Crepitus: No Crepitus: No Rash: No Rash: No Dry/Scaly: Yes Maceration: No N/A Periwound Skin Moisture: Maceration: No Dry/Scaly: No Atrophie Blanche: No Atrophie Blanche: No N/A Periwound Skin Color: Cyanosis: No Cyanosis: No Ecchymosis: No Ecchymosis: No Erythema: No Erythema: No Hemosiderin Staining: No Hemosiderin Staining: No Mottled: No Mottled: No Pallor: No Pallor: No Rubor: No Rubor: No No Abnormality No Abnormality N/A Temperature: Yes N/A N/A Tenderness on Palpation: Debridement Debridement N/A Procedures Performed: Treatment Notes Wound #12 (Ischial Tuberosity) Wound Laterality: Left Cleanser Soap and Water Discharge Instruction: May shower and wash wound with dial antibacterial soap and water prior to dressing change. Wound Cleanser Discharge Instruction: Cleanse the wound with wound cleanser prior to applying a clean dressing using gauze sponges, not tissue or cotton balls. Peri-Wound Care Skin Prep Discharge Instruction: Use skin prep as directed Topical Gentamicin Discharge Instruction: As directed by physician Primary Dressing Dakin's Solution 0.25%, 16 (oz) Discharge Instruction: Moisten gauze with Dakin's solution Santyl Ointment Discharge Instruction: Apply nickel thick amount to wound bed as instructed Medline Woven Gauze Sponges 4x4 (in/in) Discharge Instruction: moisten with saline and pack lightly into wound Secondary Dressing Zetuvit Plus Silicone Border Dressing 5x5 (in/in) Discharge  Instruction: Apply silicone border or ABD padover primary dressing as directed. Secured With [employer], [name] ([id number]) [id number] Page 4 of 9 Compression Stockings Add-Ons Wound #15 (Ischium) Wound Laterality: Right Cleanser Soap and Water Discharge Instruction: May shower and wash wound with dial antibacterial soap and water prior to dressing change. Wound Cleanser Discharge Instruction: Cleanse the wound with wound cleanser prior to applying a clean dressing using gauze sponges, not tissue or cotton balls. Peri-Wound Care Skin Prep Discharge Instruction: Use skin prep as directed Topical Gentamicin Discharge Instruction: As directed by physician Primary Dressing Dakin's Solution 0.25%, 16 (oz) Discharge Instruction: Moisten gauze with Dakin's solution Santyl Ointment Discharge Instruction: Apply nickel thick amount to wound bed as instructed Medline Woven Gauze Sponges 4x4 (in/in) Discharge Instruction: moisten with saline and pack lightly into wound Secondary Dressing Zetuvit Plus Silicone Border Dressing 5x5 (in/in) Discharge Instruction: Apply silicone border or ABD padover primary dressing as directed. Secured With Compression Wrap Compression Stockings Environmental education officer) Signed: 04/03/2022 11:10:07 AM By: Fredirick Maudlin MD FACS Entered By: Fredirick Maudlin  on 04/03/2022 11:10:06 -------------------------------------------------------------------------------- Bradley Details Patient Name: Date of Service: [name], [name] 04/03/2022 10:45 A M Medical Record Number: [id number] Patient Account Number: [id number] Date of Birth/Sex: Treating RN: Jun 07, 1950 (71 y.o. [name] Primary Care Odaly Peri: [name] Other Clinician: Referring Atia Haupt: Treating Eather Chaires/Extender: Nance Pew in Treatment: [place] reviewed with  physician Active Inactive Pressure Nursing Diagnoses: Knowledge deficit related to management of pressures ulcers Potential for impaired tissue integrity related to pressure, friction, moisture, and shear Bocock, [name] ([id number]) [phone].pdf Page 5 of 9 Goals: Patient/caregiver will verbalize understanding of pressure ulcer management Date Initiated: 05/22/2021 Target Resolution Date: 05/09/2022 Goal Status: Active Interventions: Assess: immobility, friction, shearing, incontinence upon admission and as needed Assess offloading mechanisms upon admission and as needed Notes: Electronic Signature(s) Signed: 04/03/2022 4:31:58 PM By: Adline Peals Entered By: Adline Peals on 04/03/2022 10:58:35 -------------------------------------------------------------------------------- Pain Assessment Details Patient Name: Date of Service: [name]. 04/03/2022 10:45 A M Medical Record Number: [id number] Patient Account Number: [id number] Date of Birth/Sex: Treating RN: 02-11-1951 (71 y.o. [name] Primary Care Irelyn Perfecto: [name] Other Clinician: Referring Wynonna Fitzhenry: Treating Tiffnay Bossi/Extender: Nance Pew in Treatment: 170 Active Problems Location of Pain Severity and Description of Pain Patient Has Paino No Site Locations Rate the pain. Current Pain Level: 0 Pain Management and Medication Current Pain Management: Electronic Signature(s) Signed: 04/03/2022 4:31:58 PM By: Adline Peals Entered By: Adline Peals on 04/03/2022 10:45:56 -------------------------------------------------------------------------------- Patient/Caregiver Education Details Patient Name: Date of Service: [name] 12/28/2023andnbsp10:45 [place] Record Number: [id number] Patient Account Number: [id number] [name], [name] ([id number]) [id number].pdf Page 6 of 9 Date of Birth/Gender:  Treating RN: Jun 30, 1950 (71 y.o. [name] Primary Care Physician: [name] Other Clinician: Referring Physician: Treating Physician/Extender: Nance Pew in Treatment: 170 Education Assessment Education Provided To: Patient Education Topics Provided Pressure: Methods: Explain/Verbal Responses: Reinforcements needed, State content correctly Electronic Signature(s) Signed: 04/03/2022 4:31:58 PM By: Adline Peals Entered By: Adline Peals on 04/03/2022 10:58:59 -------------------------------------------------------------------------------- Wound Assessment Details Patient Name: Date of Service: [name]. 04/03/2022 10:45 A M Medical Record Number: [id number] Patient Account Number: [id number] Date of Birth/Sex: Treating RN: 06/29/1950 (71 y.o. [name] Primary Care Vedder Brittian: [name] Other Clinician: Referring Geraldene Eisel: Treating Keishana Klinger/Extender: Burgess Amor Weeks in Treatment: 170 Wound Status Wound Number: 12 Primary Pressure Ulcer Etiology: Wound Location: Left Ischial Tuberosity Wound Open Wounding Event: Pressure Injury Status: Date Acquired: 09/06/2018 Comorbid Anemia, Hypertension, Type II Diabetes, Osteoarthritis, Weeks Of Treatment: 170 History: Dementia, Paraplegia, Received Radiation Clustered Wound: No Photos Wound Measurements Length: (cm) 4 Width: (cm) 7.6 Depth: (cm) 3 Area: (cm) 23.876 Volume: (cm) 71.628 % Reduction in Area: -10016.9% % Reduction in Volume: -33686.8% Epithelialization: Small (1-33%) Tunneling: No Undermining: Yes Starting Position (o'clock): 6 Ending Position (o'clock): 3 Maximum Distance: (cm) 1 Wound Description Classification: Category/Stage IV Pereyra, [name] ([id number]) Wound Margin: Well defined, not attached Exudate Amount: Medium Exudate Type: Serosanguineous Exudate Color: red, brown Foul Odor After Cleansing:  No [id number] Page 7 of 9 Slough/Fibrino Yes Wound Bed Granulation Amount: Large (67-100%) Exposed Structure Granulation Quality: Red, Pink Fascia Exposed: No Necrotic Amount: Small (1-33%) Fat Layer (Subcutaneous Tissue) Exposed: Yes Necrotic Quality: Adherent Slough Tendon Exposed: No Muscle Exposed: No Joint Exposed: No Bone Exposed: No Periwound Skin Texture Texture Color No Abnormalities Noted: No No Abnormalities Noted: No Callus: No Atrophie Blanche: No  Crepitus: No Cyanosis: No Excoriation: No Ecchymosis: No Induration: No Erythema: No Rash: No Hemosiderin Staining: No Scarring: Yes Mottled: No Pallor: No Moisture Rubor: No No Abnormalities Noted: No Dry / Scaly: Yes Temperature / Pain Maceration: No Temperature: No Abnormality Tenderness on Palpation: Yes Treatment Notes Wound #12 (Ischial Tuberosity) Wound Laterality: Left Cleanser Soap and Water Discharge Instruction: May shower and wash wound with dial antibacterial soap and water prior to dressing change. Wound Cleanser Discharge Instruction: Cleanse the wound with wound cleanser prior to applying a clean dressing using gauze sponges, not tissue or cotton balls. Peri-Wound Care Skin Prep Discharge Instruction: Use skin prep as directed Topical Gentamicin Discharge Instruction: As directed by physician Primary Dressing Dakin's Solution 0.25%, 16 (oz) Discharge Instruction: Moisten gauze with Dakin's solution Santyl Ointment Discharge Instruction: Apply nickel thick amount to wound bed as instructed Medline Woven Gauze Sponges 4x4 (in/in) Discharge Instruction: moisten with saline and pack lightly into wound Secondary Dressing Zetuvit Plus Silicone Border Dressing 5x5 (in/in) Discharge Instruction: Apply silicone border or ABD padover primary dressing as directed. Secured With Compression Wrap Compression Stockings Add-Ons Electronic Signature(s) Signed:  04/03/2022 11:05:54 AM By: Worthy Rancher Signed: 04/03/2022 4:31:58 PM By: Adline Peals Entered By: Worthy Rancher on 04/03/2022 10:59:52 Naeve, [name] ([id number]) [id number] Page 8 of 9 -------------------------------------------------------------------------------- Wound Assessment Details Patient Name: Date of Service: [name], [name] 04/03/2022 10:45 A M Medical Record Number: [id number] Patient Account Number: [id number] Date of Birth/Sex: Treating RN: 1950/10/30 (71 y.o. [name] Primary Care Sharaya Boruff: [name] Other Clinician: Referring Joleah Kosak: Treating Ramiya Delahunty/Extender: Burgess Amor Weeks in Treatment: 170 Wound Status Wound Number: 15 Primary Pressure Ulcer Etiology: Wound Location: Right Ischium Wound Open Wounding Event: Pressure Injury Status: Date Acquired: 07/21/2021 Comorbid Anemia, Hypertension, Type II Diabetes, Osteoarthritis, Weeks Of Treatment: 35 History: Dementia, Paraplegia, Received Radiation Clustered Wound: No Photos Wound Measurements Length: (cm) 1 Width: (cm) 1 Depth: (cm) 1 Area: (cm) Volume: (cm) .4 % Reduction in Area: 91.4% .3 % Reduction in Volume: -46.3% .7 Epithelialization: Medium (34-66%) 1.429 Tunneling: No 2.43 Undermining: No Wound Description Classification: Category/Stage III Wound Margin: Distinct, outline attached Exudate Amount: Medium Exudate Type: Serosanguineous Exudate Color: red, brown Foul Odor After Cleansing: No Slough/Fibrino Yes Wound Bed Granulation Amount: Large (67-100%) Exposed Structure Granulation Quality: Red Fascia Exposed: No Necrotic Amount: Small (1-33%) Fat Layer (Subcutaneous Tissue) Exposed: Yes Necrotic Quality: Adherent Slough Tendon Exposed: No Muscle Exposed: No Joint Exposed: No Bone Exposed: No Periwound Skin Texture Texture Color No Abnormalities Noted: No No Abnormalities Noted: No Callus: No Atrophie Blanche:  No Crepitus: No Cyanosis: No Excoriation: No Ecchymosis: No Induration: No Erythema: No Rash: No Hemosiderin Staining: No Scarring: Yes Mottled: No Pallor: No Moisture Rubor: No No Abnormalities Noted: No Dry / Scaly: No Temperature / Pain Maceration: No Temperature: No Abnormality [name], [name] ([id number]) [id number] Page 9 of 9 Treatment Notes Wound #15 (Ischium) Wound Laterality: Right Cleanser Soap and Water Discharge Instruction: May shower and wash wound with dial antibacterial soap and water prior to dressing change. Wound Cleanser Discharge Instruction: Cleanse the wound with wound cleanser prior to applying a clean dressing using gauze sponges, not tissue or cotton balls. Peri-Wound Care Skin Prep Discharge Instruction: Use skin prep as directed Topical Gentamicin Discharge Instruction: As directed by physician Primary Dressing Dakin's Solution 0.25%, 16 (oz) Discharge Instruction: Moisten gauze with Dakin's solution Santyl Ointment Discharge Instruction: Apply nickel thick amount to wound bed as instructed Medline Woven Gauze Sponges 4x4 (  in/in) Discharge Instruction: moisten with saline and pack lightly into wound Secondary Dressing Zetuvit Plus Silicone Border Dressing 5x5 (in/in) Discharge Instruction: Apply silicone border or ABD padover primary dressing as directed. Secured With Compression Wrap Compression Stockings Add-Ons Electronic Signature(s) Signed: 04/03/2022 11:05:54 AM By: Worthy Rancher Signed: 04/03/2022 4:31:58 PM By: Adline Peals Entered By: Worthy Rancher on 04/03/2022 10:59:00 -------------------------------------------------------------------------------- Vitals Details Patient Name: Date of Service: [name], [name]. 04/03/2022 10:45 A M Medical Record Number: [id number] Patient Account Number: [id number] Date of Birth/Sex: Treating RN: July 31, 1950 (71 y.o. [name] Primary Care  Princetta Uplinger: [name] Other Clinician: Referring Annick Dimaio: Treating Jameyah Fennewald/Extender: Nance Pew in Treatment: 170 Vital Signs Time Taken: 10:45 Temperature (F): 97.9 Height (in): 63 Pulse (bpm): 94 Weight (lbs): 185 Respiratory Rate (breaths/min): 18 Body Mass Index (BMI): 32.8 Blood Pressure (mmHg): 143/73 Reference Range: 80 - 120 mg / dl Electronic Signature(s) Signed: 04/03/2022 4:31:58 PM By: Adline Peals Entered By: Adline Peals on 04/03/2022 10:45:49

## 2022-04-09 ENCOUNTER — Other Ambulatory Visit: Payer: Medicare Other

## 2022-04-11 ENCOUNTER — Ambulatory Visit (INDEPENDENT_AMBULATORY_CARE_PROVIDER_SITE_OTHER): Payer: Medicare Other

## 2022-04-11 ENCOUNTER — Ambulatory Visit (INDEPENDENT_AMBULATORY_CARE_PROVIDER_SITE_OTHER): Payer: Medicare Other | Admitting: Podiatry

## 2022-04-11 DIAGNOSIS — S82891D Other fracture of right lower leg, subsequent encounter for closed fracture with routine healing: Secondary | ICD-10-CM | POA: Diagnosis not present

## 2022-04-11 DIAGNOSIS — L97312 Non-pressure chronic ulcer of right ankle with fat layer exposed: Secondary | ICD-10-CM

## 2022-04-11 DIAGNOSIS — L97511 Non-pressure chronic ulcer of other part of right foot limited to breakdown of skin: Secondary | ICD-10-CM | POA: Diagnosis not present

## 2022-04-14 NOTE — Progress Notes (Signed)
Subjective: Chief Complaint  Patient presents with   Fracture    Follow up fracture ankle right and wounds to dorsal foot and lateral ankle right   "Its getting better"    72 year old female presents the office with her husband for follow-up evaluation of right ankle ulcer, fracture.  They continue with the silver collagen dressings.  States the wounds are doing better.  She has not had any fevers or chills.  Objective: AAO x3, NAD DP/PT pulses palpable bilaterally, CRT less than 3 seconds Right ankle: Superficial area skin breakdown noted to the lateral aspect ankle appears to be much improved.  Granulation tissue is present swelling or bleeding but there is no purulence.  Is present the dorsal aspect of the foot is almost fully healed only 1 small superficial area skin breakdown.  There is no probing to bone, undermining or tunneling to either of the wounds.  There is no fluctuation or crepitation.  No malodor. No pain with calf compression, swelling, warmth, erythema  Assessment: Right ankle, foot ulceration, ankle fracture  Plan: -All treatment options discussed with the patient including all alternatives, risks, complications.  -X-rays were obtained reviewed of the foot and ankle.  Multiple views were obtained.  Ankle fracture noted with angular deformity.  -Clinically the ankle is more stable.  She been using a surgical shoe and she does not use her right lower extremity her to continue with daily dressing changes with the silver alginate -Monitor for any clinical signs or symptoms of infection and directed to call the office immediately should any occur or go to the ER.   Trula Slade DPM

## 2022-05-01 ENCOUNTER — Encounter (HOSPITAL_BASED_OUTPATIENT_CLINIC_OR_DEPARTMENT_OTHER): Payer: Medicare Other | Admitting: General Surgery

## 2022-05-02 ENCOUNTER — Ambulatory Visit: Payer: Medicare Other | Admitting: Podiatry

## 2022-05-05 ENCOUNTER — Other Ambulatory Visit: Payer: Self-pay | Admitting: Podiatry

## 2022-05-05 DIAGNOSIS — L97511 Non-pressure chronic ulcer of other part of right foot limited to breakdown of skin: Secondary | ICD-10-CM

## 2022-05-05 DIAGNOSIS — S82891D Other fracture of right lower leg, subsequent encounter for closed fracture with routine healing: Secondary | ICD-10-CM

## 2022-05-05 DIAGNOSIS — L97312 Non-pressure chronic ulcer of right ankle with fat layer exposed: Secondary | ICD-10-CM

## 2022-05-08 ENCOUNTER — Encounter (HOSPITAL_BASED_OUTPATIENT_CLINIC_OR_DEPARTMENT_OTHER): Payer: Medicare Other | Attending: General Surgery | Admitting: General Surgery

## 2022-05-08 DIAGNOSIS — Z87891 Personal history of nicotine dependence: Secondary | ICD-10-CM | POA: Diagnosis not present

## 2022-05-08 DIAGNOSIS — L89324 Pressure ulcer of left buttock, stage 4: Secondary | ICD-10-CM | POA: Diagnosis present

## 2022-05-08 DIAGNOSIS — G8220 Paraplegia, unspecified: Secondary | ICD-10-CM | POA: Diagnosis not present

## 2022-05-08 DIAGNOSIS — E119 Type 2 diabetes mellitus without complications: Secondary | ICD-10-CM | POA: Diagnosis not present

## 2022-05-08 NOTE — Progress Notes (Signed)
EMALY, BOSCHERT ([id number]) [id number] Page 1 of 18 Visit Report for 05/08/2022 Chief Complaint Document Details Patient Name: Date of Service: [name], [name]. 05/08/2022 10:30 A M Medical Record Number: [id number] Patient Account Number: [id number] Date of Birth/Sex: Treating RN: 1951-01-30 (72 y.o. F) Primary Care Provider: [name] Other Clinician: Referring Provider: Treating Provider/Extender: Nance Pew in Treatment: 175 Information Obtained from: Patient Chief Complaint She is here for follow up evaluation of left ischial pressure ulcer 12/30/2018; patient comes back for review of wounds in the same general area over the previously healed left ischial pressure ulcer Electronic Signature(s) Signed: 05/08/2022 11:50:13 AM By: Fredirick Maudlin MD FACS Entered By: Fredirick Maudlin on 05/08/2022 11:50:12 -------------------------------------------------------------------------------- Debridement Details Patient Name: Date of Service: [name]. 05/08/2022 10:30 A M Medical Record Number: [id number] Patient Account Number: [id number] Date of Birth/Sex: Treating RN: October 18, 1950 (72 y.o. Martyn Malay, Vaughan Basta Primary Care Provider: [name] Other Clinician: Referring Provider: Treating Provider/Extender: Nance Pew in Treatment: 175 Debridement Performed for Assessment: Wound #16 Left,Lateral Ischium Performed By: Physician Fredirick Maudlin, MD Debridement Type: Debridement Level of Consciousness (Pre-procedure): Awake and Alert Pre-procedure Verification/Time Out Yes - 11:15 Taken: Start Time: 11:17 Pain Control: Lidocaine 4% T opical Solution T Area Debrided (L x W): otal 3.4 (cm) x 1.3 (cm) = 4.42 (cm) Tissue and other material debrided: Viable, Non-Viable, Slough, Subcutaneous, Slough Level: Skin/Subcutaneous Tissue Debridement Description: Excisional Instrument: Curette Bleeding:  Minimum Hemostasis Achieved: Pressure Procedural Pain: 3 Post Procedural Pain: 0 Response to Treatment: Procedure was tolerated well Level of Consciousness (Post- Awake and Alert procedure): Post Debridement Measurements of Total Wound Length: (cm) 3.4 Stage: Category/Stage III Width: (cm) 1.3 Depth: (cm) 4.5 Volume: (cm) 15.622 Character of Wound/Ulcer Post Debridement: Improved Post Procedure Diagnosis Dopson, [name] ([id number]) [id number] Page 2 of 18 Same as Pre-procedure Notes scribed by Baruch Gouty, RN for Dr. Celine Ahr Electronic Signature(s) Signed: 05/08/2022 12:21:08 PM By: Fredirick Maudlin MD FACS Signed: 05/08/2022 4:55:33 PM By: Baruch Gouty RN, BSN Entered By: Baruch Gouty on 05/08/2022 11:22:09 -------------------------------------------------------------------------------- Debridement Details Patient Name: Date of Service: [name]. 05/08/2022 10:30 A M Medical Record Number: [id number] Patient Account Number: [id number] Date of Birth/Sex: Treating RN: [birth year] (72 y.o. Martyn Malay, Linda Primary Care Provider: [name] Other Clinician: Referring Provider: Treating Provider/Extender: Nance Pew in Treatment: 175 Debridement Performed for Assessment: Wound #12 Left Ischial Tuberosity Performed By: Physician Fredirick Maudlin, MD Debridement Type: Debridement Level of Consciousness (Pre-procedure): Awake and Alert Pre-procedure Verification/Time Out Yes - 11:15 Taken: Start Time: 11:17 Pain Control: Lidocaine 4% T opical Solution T Area Debrided (L x W): otal 3.5 (cm) x 4 (cm) = 14 (cm) Tissue and other material debrided: Viable, Non-Viable, Slough, Subcutaneous, Slough Level: Skin/Subcutaneous Tissue Debridement Description: Excisional Instrument: Curette Bleeding: Minimum Hemostasis Achieved: Pressure Procedural Pain: 3 Post Procedural Pain: 0 Response to Treatment: Procedure was  tolerated well Level of Consciousness (Post- Awake and Alert procedure): Post Debridement Measurements of Total Wound Length: (cm) 3.5 Stage: Category/Stage IV Width: (cm) 4 Depth: (cm) 1.6 Volume: (cm) 17.593 Character of Wound/Ulcer Post Debridement: Improved Post Procedure Diagnosis Same as Pre-procedure Notes scribed by Baruch Gouty, RN for Dr. Celine Ahr Electronic Signature(s) Signed: 05/08/2022 12:21:08 PM By: Fredirick Maudlin MD FACS Signed: 05/08/2022 4:55:33 PM By: Baruch Gouty RN, BSN Entered By: Baruch Gouty on 05/08/2022 11:23:10 HPI Details -------------------------------------------------------------------------------- [name] ([id number]) [id number] Page 3 of 18 Patient Name:  Date of Service: [name], [name] 05/08/2022 10:30 A M Medical Record Number: [id number] Patient Account Number: [id number] Date of Birth/Sex: Treating RN: 06-20-1950 (72 y.o. F) Primary Care Provider: [name] Other Clinician: Referring Provider: Treating Provider/Extender: Nance Pew in Treatment: 175 History of Present Illness Location: open ulceration of the left gluteal area, left heel and right ankle for about 5 months. Quality: Patient reports No Pain. Severity: Patient states wound(s) are getting worse. Duration: Patient has had the wound for > 5 months prior to seeking treatment at the wound center Context: The wound occurred when the patient has been paraplegic for about 3 years. Modifying Factors: Wound improving due to current treatment. ssociated Signs and Symptoms: Patient reports having foul odor. A HPI Description: this 72 year old patient who is known to have hypertension, hypothyroidism, breast cancer, chronic pain syndrome, paraplegia was noted to have a left gluteal decubitus ulcer and was brought into the hospital. During the course of her hospitalization she was debrided in the operating room by  ankle wound. Bone cultures were taken at that time but were negative but clinically she was treated for osteomyelitis because of the probing down to bone and open exposed bone. Home health has been giving her antibioticss which include vancomycin and Zosyn. The patient was a smoker until about 3 weeks ago and used to smoke about 10 cigarettes a day for a long while. 12/13/2014 - details of her operative note from 11/03/2014 were reviewed -- PROCEDURE: 1. Excisional debridement skin, subcutaneous, muscle left ischium 35 cm2 2. Excisional debridement skin, subcutaneous tissue left heel 27 cm2 3. Excisional debridement right ankle skin, subcutaneous, bone 30 cm2 01/24/2015 -- she has some issues with her wheelchair cushion but other than that is doing very well and has received Podus boots for her feet. 02/14/2015 -- she was using her old offloading boots and this seemed to have caused her a new pressure ulcer on the left posterior heel near the superior part just below the Achilles tendon. 03/07/2015 -- she has a new ulceration just to the left of the midline on her sacral region more on the left buttock and this has been there for Dr. Leland Johns and had all the wounds sharply debrided. The debridement was done for the left ischial wound, the left heel wound and the right about a week. 08/22/2015 -- was recently admitted to hospital between May 5 and 08/13/2015, with sepsis and leukocytosis due to a UTI. she was treated for a sepsis complicating Escherichia coli UTI and kidney stones. She also had metabolic and careful up at the secondary to pyelonephritis. He received broad-spectrum antibiotics initially and then received Macrobid as per urology. She was sent home on nitrofurantoin. during her admission she had a CT scan which showed exposed left ischial tuberosity without evidence of osteolysis. 09/12/2015-- the patient is having some issues with her air mattress and would like to get a opinion from  medical modalities. 10/10/2015 -- the issue with her air mattress has not yet been sorted out and the new problem seems to be a lot of odor from the wound VAC. 11/27/2015 -- the patient was admitted to the hospital between July 23 and 10/31/2015. Her problems were sepsis, osteomyelitis of the pelvic bone and acute pyelonephritis. CT of the abdomen and pelvis was consistent with a left-sided pyelonephritis with hydronephrosis and also just showed new sclerosis of the posterior portion of the left anterior pubic ramus suggestive of periosteal reaction consistent with osteomyelitis. She  was treated for the osteomyelitis with infectious disease consult recommending 6 weeks of IV antibiotics including vancomycin and Rocephin and the antibiotics were to go on until 12/10/2015. He was seen by Dr. Iran Planas plastic surgery and Dr. Linus Salmons of infectious disease. She had a suprapubic catheter placed during the admission. CT scan done on 10/28/2015 showed specifically -- New sclerosis of the posterior portion of the left inferior pubic ramus with aggressive periosteal reaction, consistent with osteomyelitis, with adjacent soft tissue gas compatible with previously described decubitus ulcer. 12/12/2015 -- she was recently seen by Dr. Linus Salmons, who noted good improvement and CRP and ESR compared to before and he has stopped her antibiotic as per plans to finish on September 4. The patient was encouraged to continue with wound care and consider hyperbaric oxygen therapy. Today she tells me that she has consented to undergo hyperbaric oxygen therapy and we can start the paperwork. 01/02/2016 -- her PCP had gained about 3 years but she still persists in having problems during hyperbaric oxygen therapy with some discomfort in the ears. 01/09/16; pressure area with underlying osteomyelitis in the left buttock. Wound bed itself has some slight amount of grayish surface slough however I do not think any debridement was  necessary. There is no exposed bone soft tissue appears stable. She is using a wound VAC 01/16/16; back for weekly wound review in conjunction with HBO. She has a deep wound over the left initial tuberosity previously treated with 6 weeks of IV antibiotics for osteomyelitis. Wound bed looks reasonably healthy although the base of this is still precariously close to bone. She has been using a wound VAC. 01/23/2016 -- she has completed her course of antibiotics and this week the only new thing is her right great toe nail was avulsed and she has got an open wound over the nailbed. 01/31/16 she has completed her course of antibiotics. Her right great toenail avulsed last week and she's been using silver alginate for this as well. Still using a wound VAC to the substantial stage IV wound over the left ischial tuberosity 03/05/2016 -- the patient has had a opinion from the plastic surgery group at [facility] and details of this are not available yet but the patient's verbal report has been heard by me. Did not sound like there was any optimistic discussion regarding reconstruction and the net result would be to continue with the wound VAC application. I will await the official reports. Addendum: -- she was seen at [facility] surgery service by Dr. Tressa Busman. After a thorough review and from what I understand spending 45 minutes with the patient his assessment has been noted by me in detail and the management options were: 1. Continued pressure offloading and wound care versus operative procedures including wound excision 2. Soft tissue and bone sampling 3. If the wound gets larger wound closure would be done using a variety of plastic surgical techniques including but not limited to skin substitute, possible skin graft, local versus regional flaps, negative pressure dressing application. 4. He discussed with her details of flap surgery and the risks associated 5. He made a  comment that since the patient was operated on by Dr. Leland Johns of [facility] plastic surgery unit in [place] the patient may continue to follow-up there for further evaluation for surgical flap closure in the future. 03/19/2016 -- the patient continues to be rather depressed and frustrated with her lack of rapid progress in healing this wound especially because she  thought after hyperbaric oxygen therapy the wound would heal extremely fast. She now understands that was not the implied benefit on wound care which was the recommendation for hyperbaric oxygen therapy. I have had a lengthy discussion with the patient and her husband regarding her options: 1. Continue with collagen and wound VAC for the primary dressing and offloading and all supportive care. 2. See Dr. Iran Planas for possible placement of Acell or Integra in the OR. 3. get a second opinion from a wound care center and surrounding regions/counties 05/07/2016 -- Note from Dr. Celedonio Miyamoto, who noted that the patient has declined flap surgery. She has discussed application of A cell, and try a few applications to see how the wound progresses. She is also recommended that we could apply products here in the wound center, like Oasis. during her preop workup it was found that her hemoglobin A1c was 11% and she has now been diagnosed as having diabetes mellitus and has been put on appropriate treatment by her PCP 05/28/2016 -- tells me her blood sugars have been doing well and she has an appointment to see her PCP in the next couple of weeks to check her hemoglobin Spadaccini, [name] ([id number]) [id number] Page 4 of 18 A1c. Other than that she continues to do well. 06/25/2016 -- have not seen her back for the last month but she says her health has been about the same and she has an appointment to check the A1c next week 09/10/16 ---- was seen by Dr. Celedonio Miyamoto -- who applied Acell and saw her  back in follow-up. She has recommended silver alginate to the wound every other day and cover with foam. If no significant drainage could transition to collagen every other day. She recommended discontinuing wound VAC. There were no plans to repeat application of Acell. The patient expressed that her husband could do the wound care as going to the Wound Ctr., would cost several $100 for each visit. 10/21/2016 -- her insurance company is getting her new mattress and she is pleased about that. Other than that she has been doing dressings with PolyMem Silver and has been doing very well 02/18/2017 -- she has gone through several changes of her mattress and has not been pleased with any of them. The ventricles are still working on trying to fit her with the appropriate low air-loss mattress. She has a new wound on the gluteal area which is clearly separated from the original wound. 03/25/17-she is here in follow-up evaluation for her left ischialpressure ulcer. She remains unsatisfied with her pressure mattress. She admits to sitting multiple hours a day, in the bed. We have discussed offloading options. The wound does not appear infected. Nutrition does not appear to be a concern. Will follow-up in 4 weeks, if wound continues to be stalled may consider x-ray to evaluate for refractory osteomyelitis. 04/21/17; this is a patient that I don't know all that well. She has a chronic wound which at one point had underlying osteomyelitis in the left ischial tuberosity. This is a stage IV pressure ulcer. Over the last 3 months she has a stage II wound inferiorly to the original wound. The last time she was here her dressing was changed to silver collagen although the patient's husband who changes the dressing said that the collagen stuck to the wound and remove skin from the superficial area therefore he switched back to [place] 05/13/17; this is a patient we've been following for a left ischial tuberosity wound  which  was stage IV at one point had underlying osteomyelitis. Over the last several months she's had a stage II wound just inferior and medial to the related to the wound. According to her husband he is using Endoform layer with collagen although this is not what I had last time. According to her husband they are using Elgie Congo with collagen although I don't quite know how that started. She was hospitalized from 1/20 through 04/30/16. This was related to a UTI. Her blood cultures were negative, urine culture showed multiple species. She did have a CT scan of the abdomen and pelvis which documented chronic osteomyelitis in the area of the wound inflammatory markers were unremarkable. She has had prior knowledge of osteomyelitis. It looks as though she received IV antibiotics in 2017 and was treated with a course of hyperbaric oxygen. 05/28/17; the wound over the left ischial tuberosity is deeper today and abuts clearly on bone. Nursing intake reported drainage. I therefore culture of the wound. The more superficial area just below this looks about the same. They once again complained that there are mattress cover is not working although apparently [facility] is been noted to see this many times in the report is that the device is functional 06/18/17; the patient had a probing area on the left ischial tuberosity that was draining purulent fluid last time. This also clearly seemed to have open bone. Culture I did showed pansensitive pseudomonas including third generation cephalosporins. I treated this with cefdinir 300 twice a day for 10 days and things seem to have improved. She has a more superficial wound just underneath this area. Amazingly she has a new air mattress through [facility]. I think they gave this to her as a parking give. In any case this now works according to the patient may have something to do with why the areas are looking better. 07/09/17; the patient has a probing  area in the left ischial tuberosity that still has some depth. However this is contracted in terms of the wound orifice although the depth is still roughly the same. There is no undermining. She also has the satellite wound which is more superficial. This appears to have a healthy surface we've been using silver collagen 08/06/17; the patient's wound is over the left ischial tuberosity and a satellite lesion just underneath this. The original wound was actually a deep stage 4 wound. We have made good progress in 2 months and there is no longer exposed bone here. 09/03/17; left ischial tuberosity actually appears to be quite healthy. I think we are making progress. No debridement is required. There is no surrounding erythema 10/01/17 I follow this patient monthly for her left ischial tuberosity wound. There is 2 areas the original area and a satellite area. The satellite area looks a lot better there is no surrounding erythema. Her husband relates that he is having trouble maintaining the dressing. This has to do with the soft tissue around it. He states he puts the collagen in but he cannot make sure that it stays in even with the ABD pads and tape that he is been using 10/29/17; patient arrives with a better looking noon today. Some of the satellite lesions have closed. using Prisma 11/26/17; the patient has a large cone-shaped area with the tip of the Cone deep within her buttock soft tissue. The walls of the Cone are epithelialized however the base is still open. The area at the base of this looks moist we've been using silver  collagen. Will change to silver alginate 12/31/2017; the wound appears to have come in fairly nicely. Using silver alginate. There is no surrounding maceration or infection 01/28/18; there is still an open area here over the left initial tuberosity. Base of this however looks healthy. There is no surrounding infection 02/25/18; the area of its open is over the left ischial  tuberosity. The base of this is where the wound is. This is a large inverted cone-shaped area with the wound at the tip. Dimensions of the wound at the tip are improved. There is a area of denuded skin about halfway towards the tip which her husband thinks may have happened today when he was bathing her. 04/20/17; the area is still open over the left initial tuberosity. 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 This is now completely closed over although it is closed over with a divot and skin over bone at the base of this. Continued aggressive offloading will be necessary. 12/30/2018 READMISSION This is a 72 year old woman with chronic paraplegia. I picked her up for her care from Dr. Con Memos in this clinic after he departed. She had a stage IV pressure wound over the left ischial tuberosity. She was treated twice for her underlying osteomyelitis and this I believe firstly in 2016 and again in 2017. There were some plans at some point for flap closure of this however she was discovered to have uncontrolled diabetes and I do not think this was ever accomplished. She ultimately healed over in this clinic and was discharged in May. She has a large cone-shaped indentation with the tip of this going towards the left ischial tuberosity. It is not an easy area to examine but at that time I thought all of this was epithelialized. Apparently there was a reopening here shortly after she left the clinic last time. She was admitted to hospital at the end of June for Klebsiella bacteremia felt to be secondary to UTI. A CT scan of the pelvis is listed below and there was initially some concern that she had underlying osteomyelitis although I believe she was seen by infectious disease and that was felt to be not the case: I  do not see any new cultures or inflammatory markers IMPRESSION: 1. No CT evidence for acute intra-abdominal or pelvic abnormality. Large volume of stool throughout the colon. 2. Enlarged fatty liver with fat sparing near the gallbladder fossa 3. Cortical scarring right kidney. Bilateral intrarenal stones without hydronephrosis. Thick-walled urinary bladder decompressed by suprapubic catheter 4. Deep left decubitus ulcer with underlying left ischial changes suggesting osteomyelitis. Her husband has been using silver collagen in the wound. She has not been systemically unwell no fever chills eating and drinking well. They rigorously offload this wound only getting up in the wheelchair when she is going to appointments the rest of the time she is in bed. 10/8; wound measures larger and she now has exposed bone. We have been using silver alginate 11/12 still using silver alginate. The patient saw Dr. Megan Salon of infectious disease. She was started on Augmentin 500 mg twice daily. She is due to follow-up with Dr. Megan Salon I believe next week. Lab work Dr. Megan Salon requested showed a sedimentation rate of 28 and CRP of 20 although her CRP 1 year ago was 18.8. Sedimentation rate 1  year ago was 11 basic metabolic panel showed a creatinine of 1.12 Canale, Blanka M ([id number]) [id number] Page 5 of 18 12/3; the patient followed up with Dr. Megan Salon yesterday. She is still on Augmentin twice daily. This was directed by Dr. Megan Salon. The patient's inflammatory markers have improved which is gratifying. Her C-reactive protein was repeated yesterday and follow-up booked with infectious disease in January. In addition I have been getting secure text messages I think from palliative care through [facility] [employer]. I think they were hoping to provide services to the patient in her home. They could not get a hold of the primary physician and so they reached out  to me on 2 separate occasions. 12/17; patient last saw Dr. Megan Salon on 12/2. She is finishing up with Augmentin. Her C-reactive protein was 20 on 10/21, 10.1 on 11/19 and 17 on 12/2. The wound itself still has depth and undermining. We are using Santyl with the backing wet-to-dry 04/27/2019. The wound is gradually clearing up in terms of the surface although it is not filled in that much. Still abuts right against bone 2/4; patient with a deep pressure ulcer over the left ischial tuberosity. I thought she was going to follow-up with infectious disease to follow her inflammatory markers although the patient states that they stated that they did not need to see her unless we felt it was necessary. I will need to check their notes. In any case we ordered moistened silver collagen back with wet-to-dry to fill in the depth of the wound although apparently [facility] sent silver alginate which they have been using since they were here the last time. Is obviously not what we ordered. 2/25. Not much change in this wound it is over the left ischial tuberosity recurrent wound. We have been using silver collagen with backing wet-to-dry. I think the wound is about the same. There is still some tunneling from about 10-12 o'clock over the ischial tuberosity itself 3/11; pressure ulcer over the left ischial tuberosity. Since she was last here the wound VAC was started and apparently going quite well. We are able to get the home health company that accepts [place] [facility] which is in itself sometimes problematic. There is been improvements in the wound the tunneling seems to be better and is contracted nicely 4/8; 1 month follow-up. Since she was last here we have been using silver collagen under a wound VAC. Some minor contraction I think in wound volume. She is cared for diligently by her husband including pressure relief, incontinence management, nutritional support etc. 6/1; this is almost a 37-monthfollow-up. She is  been using silver collagen under wound VAC. Circular area over the left ischial tuberosity. She has been using silver collagen under wound VAC 7/8; 1 month follow-up. Silver collagen under the VAC not really a lot of progress. Tissue at the base of the wound which is right against bone and the tissue next that this does not look completely viable. She is not currently on any antibiotics, she had underlying osteomyelitis I need to look this over 8/16; we are using silver collagen under wound VAC to the left ischial tuberosity wound. Comes in today with absolutely no change in surface area or depth. There is no exposed bone. I did look over her infectious disease notes as I said I would do last time. She last saw Dr. CMegan Salonin December 2020. She completed 6 weeks of Augmentin. This was in response to a bone culture I did showing  methicillin susceptible staph aureus and Enterococcus. She was supposed to come back to see Dr. Megan Salon at some point although they say that that appointment was canceled unless I chose to recommend return. I think there was supposed to be follow-up with inflammatory markers but I cannot see that that was ever done. She has not been on antibiotics since 9/21; monthly follow-up. We received a call from home health nurse last evening to report green drainage coming out of the wound. Lab work I ordered last time showed a white count of 5.2 a sedimentation rate of 45 and a C-reactive protein of 25 however neither one of the 2 values are substantially different from her previous values in October 2020 or December 2020. Both are slightly higher but only marginally. Otherwise no new complaints from the patient or her husband 10/19; 1 month follow-up. PCR culture I did last time showed medium quantities of Pseudomonas lower quantities Klebsiella and Enterococcus faecalis group B strep and Peptostreptococcus. I gave her Augmentin for 2 weeks. I am not really sure of my choice of this I  would not cover Pseudomonas. She is still having green drainage. Wound itself looks satisfactory there is not a lot of depth wound bed looks healthy 11/16; patient has completed the antibiotics still using gentamicin and silver alginate on the wound. There is improvement in the surface area 12/21; in general the area on the buttock looks somewhat better. Surface looks healthy although I do not know that there is been much improvement in the wound volume. We have been using silver alginate and Hydrofera Blue. Less drainage. In passing the husband showed me an abrasion injury on the left anterior tibia. Covered in necrotic surface. He has noticed this for about a week and has been putting silver collagen on it. He is completely uncertain about how this happened 1/25; monthly follow-up. The area on the left buttock is about the same. This does not go to bone but a fairly deep wound surface of the wound is of questionable viability. The abrasion injury that they showed me last time apparently was closed out by home health because they thought it was healed but certainly is not although it is just about healed. As a result they haven't been applying anything to this area Finally I did discuss with the patient and her husband the idea of an advanced treatment product to try and get a proper base to this wound I was thinking of Puraply however actually the patient points out that her co-pay for coming to visit [place] i.e. the facility, charge would be unaffordable if they have to, on a weekly basis 2/22; pressure ulcer on the left buttock appears deeper to me and abuts on the ischial tuberosity. I thought initially there was exposed bone but there is a rim of tissue over this area. She also has a superficial over the right anterior mid tibia. Been using silver collagen to these areas without much success. I have looked over the patient's past history with regards to the area on the left ischium. She did have  underlying osteomyelitis here dating back I think to late 2020. She saw Dr. Megan Salon she received a 6-week course of oral antibiotics in response to a bone culture that I did. This does not appear to be infected but it certainly has not been improving in terms of granulation. I do not believe she has had any recent imaging studies 3/29; 1 month follow-up. Pressure ulcer on the left buttock which she  has been dealing with with for a number of years. She was treated for underlying osteomyelitis at 1.2 or 3 years ago I think with infectious disease help. She also had I think a flap closure by Dr. Leland Johns and that lasted for about a year and then reopened. I have not been able to get this patient to progress towards healing although truthfully the wound is absolutely no worse. We have been using Hydrofera Blue 4/26; patient presents for 1 week follow-up. She has been using silver collagen to the area every other day. She has home health that comes out once a week to help with dressing changes as well. The patient is interested in trying a skin substitute over this area. She states she is trying to relieve pressure off of it most of the day. 5/24; patient presents for 1 month follow-up. She has been using silver collagen to the area every other day. She has no complaints today. She is interested in the skin substitute. She tries to leave relieve pressure off Her bottom however is not able to most of the day 6/14; using silver collagen to the area over the left ischial tuberosity. Wound does not appear to be doing particularly well. Open to bone 6/28; the patient patient presented last time with a marked deterioration. Depth probing all the way to bone. The bone itself did not look particularly viable. In spite of this the x-ray I did showed longstanding ulcer over the left ischial tuberosity with chronic bone involvement/reaction that was also seen by CT in 2020. Lab work did not show just active  infection with a sed rate of 14 and a C-reactive protein of 7.1. Her comprehensive metabolic panel was normal including an albumin of 4.1 white count was 6 7/19; patient was here 3 weeks ago with a marked deterioration in her wound over the left ischial tuberosity. I ordered a CT scan of the area for 1 reason or another this just did not get done. It is now booked for 8 days from now. She was supposed to come back for bone culture and pathology. That did not happen either. We have been using silver collagen. As usual she is diligently looked after by her husband 8/24; 5-week follow-up. Since the patient was last here the biopsy that I did of her ischial tuberosity came back suggesting osteomyelitis. Culture showed strep. I started her on Augmentin. She was seen by infectious disease Dr. Lucianne Lei dam and the Augmentin was continued and she is still taking it. CT scan did not suggest anything other than chronic osteomyelitis without much change from her previous study. Her C-reactive protein was only 7 and sedimentation rate was 1.1. We are still using silver collagen to the wound. She has home health. Her husband is very diligent in her care for this reason I have never pursued a diverting colostomy. She would not agree to this surgery in any case. Finally we have discussed plastic surgery with him in the past and she is not interested in a myocutaneous flap. 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 Since she was last here she was in the ER last week with abdominal pain. She was found to be impacted however in the course of the review there somebody gave her some IV morphine and apparently she developed hives and blisters. There is still tense blisters on her plantar left fifth and fourth fingers 10/4; the patient has completed her Augmentin and is due to follow-up with Dr. Drucilla Schmidt in  early November. Her wound today measures about the same but looks a little healthier in terms of granulation there is no  exposed bone. I note that she was admitted to hospital for 2 days from 9/21 through 9/23 for delirium. She had received Versed for glaucoma surgery ultimately that was felt to be the etiology. Her blood cultures were negative she had 30,000 colonies of Pseudomonas in her urine. She did receive broad-spectrum antibiotic therapy but ultimately her wound on the buttock was not felt to be the cause. As ABBEGALE, STEHLE ([id number]) [id number] Page 6 of 18 mentioned she has completed her antibiotics still using silver collagen 11/1; patient has completed her antibiotics for the underlying osteomyelitis. She apparently follows up with Dr. Drucilla Schmidt next week. The wound does not look too bad perhaps slightly narrower in terms of width but the depth is about the same. We have been using Hydrofera Blue. The patient talks to me about a wound VAC and made it sound as though she was recently on 1 although I do not see this. The other option is an advanced treatment product like Oasis but that means weekly trips into the clinic. She has previously said she does not want an attempt at plastic surgery 11/15; the patient saw Dr. Drucilla Schmidt noted that her follow-up inflammatory markers normalized. She has completed her antibiotics. We are currently using Hydrofera Blue. Wound itself has some surface tissue over bone but certainly not a lot. I have looked back over her history. The patient has had recurrent osteomyelitis in this area as she is received prolonged courses of antibiotics. We did use a wound VAC for a prolonged period of time in 2021. We had some improvement especially in undermining areas but overall not a lot of measurable improvement. Once again I have tried to think about using advanced treatment products in this area something that would require them visiting the clinic very frequently and they did not seem to want to do this. Most recently we ran Valdez however she would have a  $563 per application co-pay 14/9; left ischial tuberosity. I do not see much difference in 3 weeks. There is no exposed bone. We are using Hydrofera Blue. Our intake nurse reports some "greenish" drainage 12/21 left ischial tuberosity. Measuring slightly smaller in surface area. The wound still has some tissue over bone i.e. there is no exposed bone. No overt infection. We did a deep tissue culture last time for PCR. The major bacteria was Pseudomonas although there were medium titers of Enterococcus faecalis Klebsiella E. coli and Morganella as well as Peptostreptococcus. Keystone antibiotic include streptomycin and vancomycin they got this last weekend and used it twice 04/17/2021; no change in this wound. It does not have undermining however the surface is of it does not look particularly viable. We have been using topical antibiotic directed at a PCR culture as well as silver alginate. It is clear in talking to the patient and her husband that she does not offload this area adequately including spending all night on this with I think a level 2 surface on her bed. I have told her that this is not adequate to heal a wound like this.. 2/15; Oasis with a $702 co-pay per application. They are using silver alginate to the wound offloading as best they can according to her husband 06/19/2021: At her last visit, Dr. Dellia Nims changed her to silver collagen, but apparently they did not receive this until just last week. They have continued using silver alginate  in the wound. Today, the wound is quite malodorous with necrotic slough. They ran out of [place] topical about 2 months ago. No new cultures have been taken.. 07/17/2021: At the last visit, the wound was in pretty poor condition with a lot of necrotic slough and substantial malodorous drainage. I did take a new culture for PCR, but for some reason this was never resulted. Nonetheless, they did receive a renewal of their [employer] apparently it was  communicated to them that they should use silver alginate rather than the silver collagen. Regardless, the wound is much cleaner at this visit and there is no odor. 07/26/2021: The sacral/left ischial ulcer is fairly clean, but there was some greenish drainage appreciated on the dressing. They have been using topical Keystone with silver alginate. She has unfortunately developed a new ulcer on her right ischium. It is unstageable with a thick layer of eschar. It is malodorous. 08/14/2021: I took a new PCR culture at her last visit from the new wound that had opened on her right ischial tuberosity. This returned with a polymicrobial population. A new topical Keystone compound was formulated. They have been using this on her wounds along with silver alginate. I also prescribed a course of oral antibiotic, Augmentin and Levaquin to try and address the multiple species with various resistance these that grew out. She was able to take this for about 10 days, but it has started to make her nauseated and she is actually thrown up on occasion. Since she takes them at the same time, she is not sure which one is causing her issue. She has also been having a lot more drainage from her left ischial wound. Her husband says that he has not been using the zinc oxide as much because it is difficult to wash off when he bathes her. We have been using Santyl to the new wound under silver alginate and silver alginate to the left ischial wound. 09/11/2021: Unfortunately, there has been fairly substantial breakdown of both of her wounds. It sounds like the gel compound that the [employer] was being mixed in just leaked out everywhere and left the patient wet and macerated. There is worsening necrotic tissue and increased depth on the right ischial wound and the left one has extended further. They have not been using the zinc oxide as recommended. They contacted [facility] and were advised to simply apply the powder directly to the  wounds. 10/09/2021: The right ischial wound has some necrotic tissue on the lateral aspect and into the base, but no purulent drainage and no surrounding erythema or induration. On the left ischial wound, there is leathery nonviable skin extending laterally from the main wound. No significant odor or drainage from this site, either. 11/05/2021: The right ischial wound has more necrotic fibrinous tissue at the base. The leathery eschar on the left ischial wound is beginning to lift at one of the edges and may be amenable to debridement today. We have been using topical powdered mupirocin to her wounds with silver alginate. 12/03/2021: Significant improvement in both of the wounds. There is still some fibrinous slough on the right ischial wound. There is also a heavy layer of slough and fibrinous exudate on the left ischial wound as well as some soft light slough on the remainder of the wound surface. We have been using Santyl for enzymatic debridement between clinic visits. 12/31/2021: The right ischial wound is cleaner today with very minimal slough. The left ischial wound has an area of devitalized subcutaneous  tissue and the most lateral extension of the wound. There is slough and biofilm on the surface, but overall the wounds are looking significantly better. We have been unable to obtain Santyl and they have just been doing wet-to-dry dressing changes with good effect. 01/30/2022: The left ischial wound is very clean with just a little bit of slough on the posterolateral aspect of the wound. She has some pressure induced deep tissue injury on the right ischial wound, but otherwise both are quite clean. 03/06/2022: The low-air-loss mattress was finally delivered last week. Even over the short period of time she has been using it, her husband thinks that the wounds have demonstrated an improvement.. Certainly, since our last visit, they look better. All of the surfaces are robust with good granulation  tissue. There is no further pressure-induced tissue damage visible. There is some slough accumulation on all of the wound surfaces. 04/03/2022: Both wounds appear improved this week. The right ischial wound is smaller and has good granulation tissue with just a little slough on the surface. At the deepest portion of the left ischial wound, there is still some nonviable-looking muscle, and a little bit of pressure induced deep tissue injury, but overall the improvement is global. 05/08/2022: The right ischial wound has healed. The left ischial wound has separated into 2 separate sites. The more caudal is deeper and has nonviable tissue at the base. The more cranial is more superficial, but still has some tissue at the lateral margin that shows signs of ongoing deep tissue pressure injury. Electronic Signature(s) Signed: 05/08/2022 11:54:33 AM By: Fredirick Maudlin MD FACS Entered By: Fredirick Maudlin on 05/08/2022 11:54:33 Nicolls, [name] ([id number]) [id number] Page 7 of 18 -------------------------------------------------------------------------------- Physical Exam Details Patient Name: Date of Service: JAHNA, LIEBERT 05/08/2022 10:30 A M Medical Record Number: [id number] Patient Account Number: [id number] Date of Birth/Sex: Treating RN: Oct 03, 1950 (72 y.o. F) Primary Care Provider: [name] Other Clinician: Referring Provider: Treating Provider/Extender: Burgess Amor Weeks in Treatment: 175 Constitutional . . . . no acute distress. Respiratory Normal work of breathing on room air. Notes 05/08/2022: The right ischial wound has healed. The left ischial wound has separated into 2 separate sites. The more caudal is deeper and has nonviable tissue at the base. The more cranial is more superficial, but still has some tissue at the lateral margin that shows signs of ongoing deep tissue pressure injury. Electronic Signature(s) Signed: 05/08/2022  11:58:08 AM By: Fredirick Maudlin MD FACS Entered By: Fredirick Maudlin on 05/08/2022 11:58:08 -------------------------------------------------------------------------------- Physician Orders Details Patient Name: Date of Service: [name], [name]. 05/08/2022 10:30 A M Medical Record Number: [id number] Patient Account Number: [id number] Date of Birth/Sex: Treating RN: [birth year] (72 y.o. 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 - Dr. Celine Ahr - Room 1 ****HOYER**** Anesthetic Wound #12 Left Ischial Tuberosity (In clinic) Topical Lidocaine 4% applied to wound bed Bathing/ Shower/ Hygiene May shower and wash wound with soap and water. - with dressing changes Off-Loading A fluidized (Group 3) mattress - Medical Modalities ir Turn and reposition every 2 hours - *** Try to shift from side to side and shift to belly (if tolerated),****avoid sitting up in bed except for  meals Additional Orders / Instructions Follow Nutritious Diet - protein shakes 2-3 times per day, Juven 2 times per day MALANIE, KOLOSKI ([id number]) [id number] Page 8 of 18 Home Health No change in wound care orders this week; continue Home Health for wound care. May utilize formulary equivalent dressing for wound treatment orders unless otherwise specified. - Mix Santyl and Gentamicin together and apply to wound beds. Moisten gauze with Dakins solution and apply to wound beds. Cover with zetuvit borders. Dressing changes to be completed by Home Health on Monday / Wednesday / Friday except when patient has scheduled visit at [facility]. - increase visits to 3 times per week (spouse having difficulty with dressing changes) Other Home Health Orders/Instructions: - [facility] Wound Treatment Wound #12 - Ischial Tuberosity Wound Laterality: Left Cleanser: Soap and Water 1 x Per Day/30 Days Discharge Instructions: May shower and wash wound with dial antibacterial soap and water prior to dressing change. Cleanser: Wound Cleanser 1 x Per Day/30 Days Discharge Instructions: Cleanse the wound with wound cleanser prior to applying a clean dressing using gauze sponges, not tissue or cotton balls. Peri-Wound Care: Skin Prep (Generic) 1 x Per Day/30 Days Discharge Instructions: Use skin prep as directed Topical: Gentamicin 1 x Per Day/30 Days Discharge Instructions: As directed by physician Prim Dressing: Dakin's Solution 0.25%, 16 (oz) 1 x Per Day/30 Days ary Discharge Instructions: Moisten gauze with Dakin's solution Prim Dressing: Santyl Ointment 1 x Per Day/30 Days ary Discharge Instructions: Apply nickel thick amount to wound bed as instructed Prim Dressing: Medline Woven Gauze Sponges 4x4 (in/in) 1 x Per Day/30 Days ary Discharge Instructions: moisten with saline and pack lightly into wound Secondary Dressing: Zetuvit Plus Silicone Border Dressing 5x5 (in/in) (Generic) 1 x Per Day/30 Days Discharge Instructions: Apply silicone border or ABD padover primary dressing as directed. Wound #16 - Ischium Wound Laterality: Left, Lateral Cleanser: Soap and Water 1 x Per Day/30 Days Discharge Instructions: May shower and wash wound with dial antibacterial soap and water prior to dressing change. Cleanser: Wound Cleanser 1 x Per Day/30 Days Discharge Instructions: Cleanse the wound with wound cleanser prior to applying a clean dressing using gauze sponges, not tissue or cotton balls. Peri-Wound Care: Skin Prep (Generic) 1 x Per Day/30 Days Discharge Instructions: Use skin prep as directed Topical: Gentamicin 1 x Per Day/30  Days Discharge Instructions: As directed by physician Prim Dressing: Dakin's Solution 0.25%, 16 (oz) 1 x Per Day/30 Days ary Discharge Instructions: Moisten gauze with Dakin's solution Prim Dressing: Santyl Ointment 1 x Per Day/30 Days ary Discharge Instructions: Apply nickel thick amount to wound bed as instructed Prim Dressing: Medline Woven Gauze Sponges 4x4 (in/in) 1 x Per Day/30 Days ary Discharge Instructions: moisten with saline and pack lightly into wound Secondary Dressing: Zetuvit Plus Silicone Border Dressing 5x5 (in/in) (Generic) 1 x Per Day/30 Days Discharge Instructions: Apply silicone border or ABD padover primary dressing as directed. Electronic Signature(s) Signed: 05/08/2022 12:21:08 PM By: Fredirick Maudlin MD FACS Entered By: Fredirick Maudlin on 05/08/2022 12:00:00 -------------------------------------------------------------------------------- Problem List Details Patient Name: Date of Service: [name], [place] 05/08/2022 10:30 A M Medical Record Number: [id number] Patient Account Number: [id number] IBETH, FAHMY ([id number]) [id number] Page 9 of 18 Date of Birth/Sex: Treating RN: March 15, 1951 (72 y.o. [name]: [name] Other Clinician: Referring Provider: Treating Provider/Extender: Nance Pew in Treatment: 175 Active Problems ICD-10 Encounter Code Description Active Date MDM Diagnosis L89.324 Pressure ulcer of  left buttock, stage 4 12/30/2018 No Yes E11.622 Type 2 diabetes mellitus with other skin ulcer 12/30/2018 No Yes Inactive Problems ICD-10 Code Description Active Date Inactive Date G82.20 Paraplegia, unspecified 12/30/2018 12/30/2018 M86.68 Other chronic osteomyelitis, other site 02/17/2019 02/17/2019 S80.811D Abrasion, right lower leg, subsequent encounter 03/27/2020 03/27/2020 M86.68 Other chronic osteomyelitis, other site 11/28/2020 11/28/2020 L97.811 Non-pressure  chronic ulcer of other part of right lower leg limited to breakdown of skin 03/27/2020 03/27/2020 L89.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 05/08/2022 10:30 A M Medical Record Number: [id number] Patient Account Number: [id number] Date of Birth/Sex: Treating RN: [birth year] (72 y.o. F) Primary Care Provider: [name] Other Clinician: Referring Provider: Treating Provider/Extender: Nance Pew in Treatment: 175 Subjective Chief Complaint Information obtained from Patient She is here for follow up evaluation of left ischial pressure ulcer 12/30/2018; patient comes back for review of wounds in the same general area over the previously healed left ischial pressure ulcer History of Present Illness (HPI) HETAL, PROANO ([id number]) [id number] Page 10 of 18 The following HPI elements were documented for the patient's wound: Location: open ulceration of the left gluteal area, left heel and right ankle for about 5 months. Quality: Patient reports No Pain. Severity: Patient states wound(s) are getting worse. Duration: Patient has had the wound for > 5 months prior to seeking treatment at the wound center Context: The wound occurred when the patient has been paraplegic for about 3 years. Modifying Factors: Wound improving due to current treatment. Associated Signs and Symptoms: Patient reports having foul odor. this 72 year old patient who is known to have hypertension, hypothyroidism, breast cancer, chronic pain syndrome, paraplegia was noted to have a left gluteal decubitus ulcer and was brought into the hospital.  During the course of her hospitalization she was debrided in the operating room by ankle wound. Bone cultures were taken at that time but were negative but clinically she was treated for osteomyelitis because of the probing down to bone and open exposed bone. Home health has been giving her antibioticss which include vancomycin and Zosyn. The patient was a smoker until about 3 weeks ago and used to smoke about 10 cigarettes a day for a long while. 12/13/2014 - details of her operative note from 11/03/2014 were reviewed -- PROCEDURE: 1. Excisional debridement skin, subcutaneous, muscle left ischium 35 cm2 2. Excisional debridement skin, subcutaneous tissue left heel 27 cm2 3. Excisional debridement right ankle skin, subcutaneous, bone 30 cm2 01/24/2015 -- she has some issues with her wheelchair cushion but other than that is doing very well and has received Podus boots for her feet. 02/14/2015 -- she was using her old offloading boots and this seemed to have caused her a new pressure ulcer on the left posterior heel near the superior part just below the Achilles tendon. 03/07/2015 -- she has a new ulceration just to the left of the midline on her sacral region more on the left buttock and this has been there for Dr. Leland Johns and had all the wounds sharply debrided. The debridement was done for the left ischial wound, the left heel wound and the right about a week. 08/22/2015 -- was recently admitted to hospital between May 5 and 08/13/2015, with sepsis and leukocytosis due to a UTI. she was treated for a sepsis  complicating Escherichia coli UTI and kidney stones. She also had metabolic and careful up at the secondary to pyelonephritis. He received broad-spectrum antibiotics initially and then received Macrobid as per urology. She was sent home on nitrofurantoin. during her admission she had a CT scan which showed exposed left ischial tuberosity without evidence of osteolysis. 09/12/2015-- the patient  is having some issues with her air mattress and would like to get a opinion from medical modalities. 10/10/2015 -- the issue with her air mattress has not yet been sorted out and the new problem seems to be a lot of odor from the wound VAC. 11/27/2015 -- the patient was admitted to the hospital between July 23 and 10/31/2015. Her problems were sepsis, osteomyelitis of the pelvic bone and acute pyelonephritis. CT of the abdomen and pelvis was consistent with a left-sided pyelonephritis with hydronephrosis and also just showed new sclerosis of the posterior portion of the left anterior pubic ramus suggestive of periosteal reaction consistent with osteomyelitis. She was treated for the osteomyelitis with infectious disease consult recommending 6 weeks of IV antibiotics including vancomycin and Rocephin and the antibiotics were to go on until 12/10/2015. He was seen by Dr. Iran Planas plastic surgery and Dr. Linus Salmons of infectious disease. She had a suprapubic catheter placed during the admission. CT scan done on 10/28/2015 showed specifically -- New sclerosis of the posterior portion of the left inferior pubic ramus with aggressive periosteal reaction, consistent with osteomyelitis, with adjacent soft tissue gas compatible with previously described decubitus ulcer. 12/12/2015 -- she was recently seen by Dr. Linus Salmons, who noted good improvement and CRP and ESR compared to before and he has stopped her antibiotic as per plans to finish on September 4. The patient was encouraged to continue with wound care and consider hyperbaric oxygen therapy. Today she tells me that she has consented to undergo hyperbaric oxygen therapy and we can start the paperwork. 01/02/2016 -- her PCP had gained about 3 years but she still persists in having problems during hyperbaric oxygen therapy with some discomfort in the ears. 01/09/16; pressure area with underlying osteomyelitis in the left buttock. Wound bed itself has some slight  amount of grayish surface slough however I do not think any debridement was necessary. There is no exposed bone soft tissue appears stable. She is using a wound VAC 01/16/16; back for weekly wound review in conjunction with HBO. She has a deep wound over the left initial tuberosity previously treated with 6 weeks of IV antibiotics for osteomyelitis. Wound bed looks reasonably healthy although the base of this is still precariously close to bone. She has been using a wound VAC. 01/23/2016 -- she has completed her course of antibiotics and this week the only new thing is her right great toe nail was avulsed and she has got an open wound over the nailbed. 01/31/16 she has completed her course of antibiotics. Her right great toenail avulsed last week and she's been using silver alginate for this as well. Still using a wound VAC to the substantial stage IV wound over the left ischial tuberosity 03/05/2016 -- the patient has had a opinion from the plastic surgery group at [facility] and details of this are not available yet but the patient's verbal report has been heard by me. Did not sound like there was any optimistic discussion regarding reconstruction and the net result would be to continue with the wound VAC application. I will await the official reports. Addendum: -- she was seen at [facility]  Plastic surgery service by Dr. Tressa Busman. After a thorough review and from what I understand spending 45 minutes with the patient his assessment has been noted by me in detail and the management options were: 1. Continued pressure offloading and wound care versus operative procedures including wound excision 2. Soft tissue and bone sampling 3. If the wound gets larger wound closure would be done using a variety of plastic surgical techniques including but not limited to skin substitute, possible skin graft, local versus regional flaps, negative pressure dressing application. 4. He discussed  with her details of flap surgery and the risks associated 5. He made a comment that since the patient was operated on by Dr. Leland Johns of [facility] plastic surgery unit in [place] the patient may continue to follow-up there for further evaluation for surgical flap closure in the future. 03/19/2016 -- the patient continues to be rather depressed and frustrated with her lack of rapid progress in healing this wound especially because she thought after hyperbaric oxygen therapy the wound would heal extremely fast. She now understands that was not the implied benefit on wound care which was the recommendation for hyperbaric oxygen therapy. I have had a lengthy discussion with the patient and her husband regarding her options: 1. Continue with collagen and wound VAC for the primary dressing and offloading and all supportive care. 2. See Dr. Iran Planas for possible placement of Acell or Integra in the OR. 3. get a second opinion from a wound care center and surrounding regions/counties 05/07/2016 -- Note from Dr. Celedonio Miyamoto, who noted that the patient has declined flap surgery. She has discussed application of A cell, and try a few applications to see how the wound progresses. She is also recommended that we could apply products here in the wound center, like Oasis. during her preop workup it was found that her hemoglobin A1c was 11% and she has now been diagnosed as having diabetes mellitus and has been put on appropriate treatment by her PCP 05/28/2016 -- tells me her blood sugars have been doing well and she has an appointment to see her PCP in the next couple of weeks to check her hemoglobin A1c. Other than that she continues to do well. 06/25/2016 -- have not seen her back for the last month but she says her health has been about the same and she has an appointment to check the A1c next week 09/10/16 ---- was seen by Dr. Celedonio Miyamoto -- who applied Acell and saw her back in  follow-up. She has recommended silver alginate to the wound every other day and cover with foam. If no significant drainage could transition to collagen every other day. She recommended discontinuing wound VAC. There were no plans to repeat application of Acell. The patient expressed that her husband could do the wound care as going to the Wound Ctr., would cost several $100 for each visit. 10/21/2016 -- her insurance company is getting her new mattress and she is pleased about that. Other than that she has been doing dressings with PolyMem Silver and has been doing very well NILSA, MACHT ([id number]) [id number] Page 11 of 18 02/18/2017 -- she has gone through several changes of her mattress and has not been pleased with any of them. The ventricles are still working on trying to fit her with the appropriate low air-loss mattress. She has a new wound on the gluteal area which is clearly separated from the original wound. 03/25/17-she is here in follow-up evaluation for her  left ischialpressure ulcer. She remains unsatisfied with her pressure mattress. She admits to sitting multiple hours a day, in the bed. We have discussed offloading options. The wound does not appear infected. Nutrition does not appear to be a concern. Will follow-up in 4 weeks, if wound continues to be stalled may consider x-ray to evaluate for refractory osteomyelitis. 04/21/17; this is a patient that I don't know all that well. She has a chronic wound which at one point had underlying osteomyelitis in the left ischial tuberosity. This is a stage IV pressure ulcer. Over the last 3 months she has a stage II wound inferiorly to the original wound. The last time she was here her dressing was changed to silver collagen although the patient's husband who changes the dressing said that the collagen stuck to the wound and remove skin from the superficial area therefore he switched back to [place] 05/13/17; this is a patient we've been following for a left ischial tuberosity wound which was stage IV at one point had underlying osteomyelitis. Over the last several months she's had a stage II wound just inferior and medial to the related to the wound. According to her husband he is using Endoform layer with collagen although this is not what I had last time. According to her husband they are using Elgie Congo with collagen although I don't quite know how that started. She was hospitalized from 1/20 through 04/30/16. This was related to a UTI. Her blood cultures were negative, urine culture showed multiple species. She did have a CT scan of the abdomen and pelvis which documented chronic osteomyelitis in the area of the wound inflammatory markers were unremarkable. She has had prior knowledge of osteomyelitis. It looks as though she received IV antibiotics in 2017 and was treated with a course of hyperbaric oxygen. 05/28/17; the wound over the left ischial tuberosity is deeper today and abuts clearly on bone. Nursing intake reported drainage. I therefore culture of the wound. The more superficial area just below this looks about the same. They once again complained that there are mattress cover is not working although apparently [facility] is been noted to see this many times in the report is that the device is functional 06/18/17; the patient had a probing area on the left ischial tuberosity that was draining purulent fluid last time. This also clearly seemed to have open bone. Culture I did showed pansensitive pseudomonas including third generation cephalosporins. I treated this with cefdinir 300 twice a day for 10 days and things seem to have improved. She has a more superficial wound just underneath this area. Amazingly she has a new air mattress through [facility]. I think they gave this to her as a parking give. In any case this now works according to the patient may have  something to do with why the areas are looking better. 07/09/17; the patient has a probing area in the left ischial tuberosity that still has some depth. However this is contracted in terms of the wound orifice although the depth is still roughly the same. There is no undermining. ooShe also has the satellite wound which is more superficial. This appears to have a healthy surface we've been using silver collagen 08/06/17; the patient's wound is over the left ischial tuberosity and a satellite lesion just underneath this. The original wound was actually a deep stage 4 wound. We have made good progress in 2 months and there is no longer exposed bone here. 09/03/17; left ischial  tuberosity actually appears to be quite healthy. I think we are making progress. No debridement is required. There is no surrounding erythema 10/01/17 I follow this patient monthly for her left ischial tuberosity wound. There is 2 areas the original area and a satellite area. The satellite area looks a lot better there is no surrounding erythema. Her husband relates that he is having trouble maintaining the dressing. This has to do with the soft tissue around it. He states he puts the collagen in but he cannot make sure that it stays in even with the ABD pads and tape that he is been using 10/29/17; patient arrives with a better looking noon today. Some of the satellite lesions have closed. using Prisma 11/26/17; the patient has a large cone-shaped area with the tip of the Cone deep within her buttock soft tissue. The walls of the Cone are epithelialized however the base is still open. The area at the base of this looks moist we've been using silver collagen. Will change to silver alginate 12/31/2017; the wound appears to have come in fairly nicely. Using silver alginate. There is no surrounding maceration or infection 01/28/18; there is still an open area here over the left initial tuberosity. Base of this however looks healthy. There  is no surrounding infection 02/25/18; the area of its open is over the left ischial tuberosity. The base of this is where the wound is. This is a large inverted cone-shaped area with the wound at the tip. Dimensions of the wound at the tip are improved. There is a area of denuded skin about halfway towards the tip which her husband thinks may have happened today when he was bathing her. 04/20/17; the area is still open over the left initial tuberosity. 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 This is now completely closed over although it is closed over with a divot and skin over bone at the base of this. Continued aggressive offloading will be necessary. 12/30/2018 READMISSION This is a 72 year old woman with chronic paraplegia. I picked her up for her care from Dr. Con Memos in this clinic after he departed. She had a stage IV pressure wound over the left ischial tuberosity. She was treated twice for her underlying osteomyelitis and this I believe firstly in 2016 and again in 2017. There were some plans at some point for flap closure of this however she was discovered to have uncontrolled diabetes and I do not think this was ever accomplished. She ultimately healed over in this clinic and was discharged in May. She has a large cone-shaped indentation with the tip of this going towards the left ischial tuberosity. It is not an easy area to examine but at that time I thought all of this was epithelialized. Apparently there was a reopening here shortly after she left the clinic last time. She was admitted to hospital at the end of June for Klebsiella bacteremia felt to be secondary to UTI. A CT scan of the pelvis is listed below and there was initially some concern that she had underlying osteomyelitis although I  believe she was seen by infectious disease and that was felt to be not the case: I do not see any new cultures or inflammatory markers IMPRESSION: 1. No CT evidence for acute intra-abdominal or pelvic abnormality. Large volume  of stool throughout the colon. 2. Enlarged fatty liver with fat sparing near the gallbladder fossa 3. Cortical scarring right kidney. Bilateral intrarenal stones without hydronephrosis. Thick-walled urinary bladder decompressed by suprapubic catheter 4. Deep left decubitus ulcer with underlying left ischial changes suggesting osteomyelitis. Her husband has been using silver collagen in the wound. She has not been systemically unwell no fever chills eating and drinking well. They rigorously offload this wound only getting up in the wheelchair when she is going to appointments the rest of the time she is in bed. 10/8; wound measures larger and she now has exposed bone. We have been using silver alginate 11/12 still using silver alginate. The patient saw Dr. Megan Salon of infectious disease. She was started on Augmentin 500 mg twice daily. She is due to follow-up with Dr. Megan Salon I believe next week. Lab work Dr. Megan Salon requested showed a sedimentation rate of 28 and CRP of 20 although her CRP 1 year ago was 18.8. Sedimentation rate 1 year ago was 11 basic metabolic panel showed a creatinine of 1.12 12/3; the patient followed up with Dr. Megan Salon yesterday. She is still on Augmentin twice daily. This was directed by Dr. Megan Salon. The patient's inflammatory markers have improved which is gratifying. Her C-reactive protein was repeated yesterday and follow-up booked with infectious disease in January. In addition I have been getting secure text messages I think from palliative care through [facility] [employer]. I think they were hoping to provide services to the patient in her home. They could not get a hold of the primary physician and so they reached out to  me on 2 separate occasions. 12/17; patient last saw Dr. Megan Salon on 12/2. She is finishing up with Augmentin. Her C-reactive protein was 20 on 10/21, 10.1 on 11/19 and 17 on 12/2. The wound itself still has depth and undermining. We are using Santyl with the backing wet-to-dry 04/27/2019. The wound is gradually clearing up in terms of the surface although it is not filled in that much. Still abuts right against bone 2/4; patient with a deep pressure ulcer over the left ischial tuberosity. I thought she was going to follow-up with infectious disease to follow her inflammatory markers although the patient states that they stated that they did not need to see her unless we felt it was necessary. I will need to check their notes. In any case we ordered moistened silver collagen back with wet-to-dry to fill in the depth of the wound although apparently [facility] sent silver alginate which they have Tax, [name] ([id number]) [id number] Page 12 of 18 been using since they were here the last time. Is obviously not what we ordered. 2/25. Not much change in this wound it is over the left ischial tuberosity recurrent wound. We have been using silver collagen with backing wet-to-dry. I think the wound is about the same. There is still some tunneling from about 10-12 o'clock over the ischial tuberosity itself 3/11; pressure ulcer over the left ischial tuberosity. Since she was last here the wound VAC was started and apparently going quite well. We are able to get the home health company that accepts [place] healthcare which is in itself sometimes problematic. There is been improvements in the wound the tunneling seems to be better and is contracted nicely 4/8; 1 month follow-up. Since she was last here we have been using silver collagen under a wound VAC. Some minor contraction I think in wound volume. She is cared for diligently by her  husband including pressure relief, incontinence  management, nutritional support etc. 6/1; this is almost a 6-monthfollow-up. She is been using silver collagen under wound VAC. Circular area over the left ischial tuberosity. She has been using silver collagen under wound VAC 7/8; 1 month follow-up. Silver collagen under the VAC not really a lot of progress. Tissue at the base of the wound which is right against bone and the tissue next that this does not look completely viable. She is not currently on any antibiotics, she had underlying osteomyelitis I need to look this over 8/16; we are using silver collagen under wound VAC to the left ischial tuberosity wound. Comes in today with absolutely no change in surface area or depth. There is no exposed bone. I did look over her infectious disease notes as I said I would do last time. She last saw Dr. CMegan Salonin December 2020. She completed 6 weeks of Augmentin. This was in response to a bone culture I did showing methicillin susceptible staph aureus and Enterococcus. She was supposed to come back to see Dr. CMegan Salonat some point although they say that that appointment was canceled unless I chose to recommend return. I think there was supposed to be follow-up with inflammatory markers but I cannot see that that was ever done. She has not been on antibiotics since 9/21; monthly follow-up. We received a call from home health nurse last evening to report green drainage coming out of the wound. Lab work I ordered last time showed a white count of 5.2 a sedimentation rate of 45 and a C-reactive protein of 25 however neither one of the 2 values are substantially different from her previous values in October 2020 or December 2020. Both are slightly higher but only marginally. Otherwise no new complaints from the patient or her husband 10/19; 1 month follow-up. PCR culture I did last time showed medium quantities of Pseudomonas lower quantities Klebsiella and Enterococcus faecalis group B strep and  Peptostreptococcus. I gave her Augmentin for 2 weeks. I am not really sure of my choice of this I would not cover Pseudomonas. She is still having green drainage. Wound itself looks satisfactory there is not a lot of depth wound bed looks healthy 11/16; patient has completed the antibiotics still using gentamicin and silver alginate on the wound. There is improvement in the surface area 12/21; in general the area on the buttock looks somewhat better. Surface looks healthy although I do not know that there is been much improvement in the wound volume. We have been using silver alginate and Hydrofera Blue. Less drainage. In passing the husband showed me an abrasion injury on the left anterior tibia. Covered in necrotic surface. He has noticed this for about a week and has been putting silver collagen on it. He is completely uncertain about how this happened 1/25; monthly follow-up. The area on the left buttock is about the same. This does not go to bone but a fairly deep wound surface of the wound is of questionable viability. The abrasion injury that they showed me last time apparently was closed out by home health because they thought it was healed but certainly is not although it is just about healed. As a result they haven't been applying anything to this area Finally I did discuss with the patient and her husband the idea of an advanced treatment product to try and get a proper base to this wound I was thinking of Puraply however actually the patient points out that  her co-pay for coming to visit [place] i.e. the facility, charge would be unaffordable if they have to, on a weekly basis 2/22; pressure ulcer on the left buttock appears deeper to me and abuts on the ischial tuberosity. I thought initially there was exposed bone but there is a rim of tissue over this area. She also has a superficial over the right anterior mid tibia. Been using silver collagen to these areas without much success. I have  looked over the patient's past history with regards to the area on the left ischium. She did have underlying osteomyelitis here dating back I think to late 2020. She saw Dr. Megan Salon she received a 6-week course of oral antibiotics in response to a bone culture that I did. This does not appear to be infected but it certainly has not been improving in terms of granulation. I do not believe she has had any recent imaging studies 3/29; 1 month follow-up. Pressure ulcer on the left buttock which she has been dealing with with for a number of years. She was treated for underlying osteomyelitis at 1.2 or 3 years ago I think with infectious disease help. She also had I think a flap closure by Dr. Leland Johns and that lasted for about a year and then reopened. I have not been able to get this patient to progress towards healing although truthfully the wound is absolutely no worse. We have been using Hydrofera Blue 4/26; patient presents for 1 week follow-up. She has been using silver collagen to the area every other day. She has home health that comes out once a week to help with dressing changes as well. The patient is interested in trying a skin substitute over this area. She states she is trying to relieve pressure off of it most of the day. 5/24; patient presents for 1 month follow-up. She has been using silver collagen to the area every other day. She has no complaints today. She is interested in the skin substitute. She tries to leave relieve pressure off Her bottom however is not able to most of the day 6/14; using silver collagen to the area over the left ischial tuberosity. Wound does not appear to be doing particularly well. Open to bone 6/28; the patient patient presented last time with a marked deterioration. Depth probing all the way to bone. The bone itself did not look particularly viable. In spite of this the x-ray I did showed longstanding ulcer over the left ischial tuberosity with chronic bone  involvement/reaction that was also seen by CT in 2020. Lab work did not show just active infection with a sed rate of 14 and a C-reactive protein of 7.1. Her comprehensive metabolic panel was normal including an albumin of 4.1 white count was 6 7/19; patient was here 3 weeks ago with a marked deterioration in her wound over the left ischial tuberosity. I ordered a CT scan of the area for 1 reason or another this just did not get done. It is now booked for 8 days from now. She was supposed to come back for bone culture and pathology. That did not happen either. We have been using silver collagen. As usual she is diligently looked after by her husband 8/24; 5-week follow-up. Since the patient was last here the biopsy that I did of her ischial tuberosity came back suggesting osteomyelitis. Culture showed strep. I started her on Augmentin. She was seen by infectious disease Dr. Lucianne Lei dam and the Augmentin was continued and she is still taking  it. CT scan did not suggest anything other than chronic osteomyelitis without much change from her previous study. Her C-reactive protein was only 7 and sedimentation rate was 1.1. We are still using silver collagen to the wound. She has home health. Her husband is very diligent in her care for this reason I have never pursued a diverting colostomy. She would not agree to this surgery in any case. Finally we have discussed plastic surgery with him in the past and she is not interested in a myocutaneous flap. 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 Since she was last here she was in the ER last week with abdominal pain. She was found to be impacted however in the course of the review there somebody gave her some IV morphine and apparently she developed hives and blisters. There is still tense blisters on her plantar left fifth and fourth fingers 10/4; the patient has completed her Augmentin and is due to follow-up with Dr. Drucilla Schmidt in early November. Her wound today  measures about the same but looks a little healthier in terms of granulation there is no exposed bone. I note that she was admitted to hospital for 2 days from 9/21 through 9/23 for delirium. She had received Versed for glaucoma surgery ultimately that was felt to be the etiology. Her blood cultures were negative she had 30,000 colonies of Pseudomonas in her urine. She did receive broad-spectrum antibiotic therapy but ultimately her wound on the buttock was not felt to be the cause. As mentioned she has completed her antibiotics still using silver collagen 11/1; patient has completed her antibiotics for the underlying osteomyelitis. She apparently follows up with Dr. Drucilla Schmidt next week. The wound does not look too bad perhaps slightly narrower in terms of width but the depth is about the same. We have been using Hydrofera Blue. The patient talks to me about a wound VAC and made it sound as though she was recently on 1 although I do not see this. The other option is an advanced treatment product like Oasis but that means weekly trips into the clinic. She has previously said she does not want an attempt at plastic surgery 11/15; the patient saw Dr. Drucilla Schmidt noted that her follow-up inflammatory markers normalized. She has completed her antibiotics. We are currently using Hydrofera Blue. Wound itself has some surface tissue over bone but certainly not a lot. GRAY, MAUGERI ([id number]) [id number] Page 13 of 18 I have looked back over her history. The patient has had recurrent osteomyelitis in this area as she is received prolonged courses of antibiotics. We did use a wound VAC for a prolonged period of time in 2021. We had some improvement especially in undermining areas but overall not a lot of measurable improvement. Once again I have tried to think about using advanced treatment products in this area something that would require them visiting the clinic very frequently and  they did not seem to want to do this. Most recently we ran [place] however she would have a $176 per application co-pay 16/0; left ischial tuberosity. I do not see much difference in 3 weeks. There is no exposed bone. We are using Hydrofera Blue. Our intake nurse reports some "greenish" drainage 12/21 left ischial tuberosity. Measuring slightly smaller in surface area. The wound still has some tissue over bone i.e. there is no exposed bone. No overt infection. We did a deep tissue culture last time for PCR. The major bacteria was Pseudomonas although there were medium  titers of Enterococcus faecalis Klebsiella E. coli and Morganella as well as Peptostreptococcus. Keystone antibiotic include streptomycin and vancomycin they got this last weekend and used it twice 04/17/2021; no change in this wound. It does not have undermining however the surface is of it does not look particularly viable. We have been using topical antibiotic directed at a PCR culture as well as silver alginate. It is clear in talking to the patient and her husband that she does not offload this area adequately including spending all night on this with I think a level 2 surface on her bed. I have told her that this is not adequate to heal a wound like this.. 2/15; Oasis with a $992 co-pay per application. They are using silver alginate to the wound offloading as best they can according to her husband 06/19/2021: At her last visit, Dr. Dellia Nims changed her to silver collagen, but apparently they did not receive this until just last week. They have continued using silver alginate in the wound. Today, the wound is quite malodorous with necrotic slough. They ran out of [place] topical about 2 months ago. No new cultures have been taken.. 07/17/2021: At the last visit, the wound was in pretty poor condition with a lot of necrotic slough and substantial malodorous drainage. I did take a new culture for PCR, but for some reason this was never  resulted. Nonetheless, they did receive a renewal of their [employer] apparently it was communicated to them that they should use silver alginate rather than the silver collagen. Regardless, the wound is much cleaner at this visit and there is no odor. 07/26/2021: The sacral/left ischial ulcer is fairly clean, but there was some greenish drainage appreciated on the dressing. They have been using topical Keystone with silver alginate. She has unfortunately developed a new ulcer on her right ischium. It is unstageable with a thick layer of eschar. It is malodorous. 08/14/2021: I took a new PCR culture at her last visit from the new wound that had opened on her right ischial tuberosity. This returned with a polymicrobial population. A new topical Keystone compound was formulated. They have been using this on her wounds along with silver alginate. I also prescribed a course of oral antibiotic, Augmentin and Levaquin to try and address the multiple species with various resistance these that grew out. She was able to take this for about 10 days, but it has started to make her nauseated and she is actually thrown up on occasion. Since she takes them at the same time, she is not sure which one is causing her issue. She has also been having a lot more drainage from her left ischial wound. Her husband says that he has not been using the zinc oxide as much because it is difficult to wash off when he bathes her. We have been using Santyl to the new wound under silver alginate and silver alginate to the left ischial wound. 09/11/2021: Unfortunately, there has been fairly substantial breakdown of both of her wounds. It sounds like the gel compound that the [employer] was being mixed in just leaked out everywhere and left the patient wet and macerated. There is worsening necrotic tissue and increased depth on the right ischial wound and the left one has extended further. They have not been using the zinc oxide as recommended.  They contacted [facility] and were advised to simply apply the powder directly to the wounds. 10/09/2021: The right ischial wound has some necrotic tissue on the lateral aspect and  into the base, but no purulent drainage and no surrounding erythema or induration. On the left ischial wound, there is leathery nonviable skin extending laterally from the main wound. No significant odor or drainage from this site, either. 11/05/2021: The right ischial wound has more necrotic fibrinous tissue at the base. The leathery eschar on the left ischial wound is beginning to lift at one of the edges and may be amenable to debridement today. We have been using topical powdered mupirocin to her wounds with silver alginate. 12/03/2021: Significant improvement in both of the wounds. There is still some fibrinous slough on the right ischial wound. There is also a heavy layer of slough and fibrinous exudate on the left ischial wound as well as some soft light slough on the remainder of the wound surface. We have been using Santyl for enzymatic debridement between clinic visits. 12/31/2021: The right ischial wound is cleaner today with very minimal slough. The left ischial wound has an area of devitalized subcutaneous tissue and the most lateral extension of the wound. There is slough and biofilm on the surface, but overall the wounds are looking significantly better. We have been unable to obtain Santyl and they have just been doing wet-to-dry dressing changes with good effect. 01/30/2022: The left ischial wound is very clean with just a little bit of slough on the posterolateral aspect of the wound. She has some pressure induced deep tissue injury on the right ischial wound, but otherwise both are quite clean. 03/06/2022: The low-air-loss mattress was finally delivered last week. Even over the short period of time she has been using it, her husband thinks that the wounds have demonstrated an improvement.. Certainly, since our  last visit, they look better. All of the surfaces are robust with good granulation tissue. There is no further pressure-induced tissue damage visible. There is some slough accumulation on all of the wound surfaces. 04/03/2022: Both wounds appear improved this week. The right ischial wound is smaller and has good granulation tissue with just a little slough on the surface. At the deepest portion of the left ischial wound, there is still some nonviable-looking muscle, and a little bit of pressure induced deep tissue injury, but overall the improvement is global. 05/08/2022: The right ischial wound has healed. The left ischial wound has separated into 2 separate sites. The more caudal is deeper and has nonviable tissue at the base. The more cranial is more superficial, but still has some tissue at the lateral margin that shows signs of ongoing deep tissue pressure injury. Patient History Information obtained from Patient. Family History Unknown History, Cancer - Mother, Diabetes - Maternal Grandparents, Heart Disease - Mother, Hypertension - Mother, Thyroid Problems - Mother, No family history of Hereditary Spherocytosis, Kidney Disease, Lung Disease, Seizures, Stroke, Tuberculosis. Social History Former smoker - ended on 11/06/2014, Marital Status - Married, Alcohol Use - Never, Drug Use - No History, Caffeine Use - Daily - T soda. ea, Medical History Eyes Denies history of Cataracts, Glaucoma, Optic Neuritis Ear/Nose/Mouth/Throat Denies history of Chronic sinus problems/congestion, Middle ear problems Bahena, [name] ([id number]) [id number] Page 14 of 18 Hematologic/Lymphatic Patient has history of Anemia Denies history of Hemophilia, Human Immunodeficiency Virus, Lymphedema, Sickle Cell Disease Respiratory Denies history of Aspiration, Asthma, Chronic Obstructive Pulmonary Disease (COPD), Pneumothorax, Sleep Apnea, Tuberculosis Cardiovascular Patient has history  of Hypertension Denies history of Angina, Arrhythmia, Congestive Heart Failure, Coronary Artery Disease, Deep Vein Thrombosis, Hypotension, Myocardial Infarction, Peripheral Arterial Disease, Peripheral Venous Disease, Phlebitis, Vasculitis Gastrointestinal Denies history  of Cirrhosis , Colitis, Crohnoos, Hepatitis A, Hepatitis B, Hepatitis C Endocrine Patient has history of Type II Diabetes Denies history of Type I Diabetes Genitourinary Denies history of End Stage Renal Disease Immunological Denies history of Lupus Erythematosus, Raynaudoos, Scleroderma Integumentary (Skin) Denies history of History of Burn Musculoskeletal Patient has history of Osteoarthritis Denies history of Gout, Rheumatoid Arthritis, Osteomyelitis Neurologic Patient has history of Dementia, Paraplegia Denies history of Neuropathy, Quadriplegia Oncologic Patient has history of Received Radiation - 2012 - right breast Psychiatric Denies history of Anorexia/bulimia, Confinement Anxiety Hospitalization/Surgery History - left knee replacement. - right breast lumpectomy. - cervical laminectomy with fusion. - hysterectomy. - tear duct surgery. - T and A. - UTI. - suprapubic cath placement. - UTI. Medical A Surgical History Notes nd Gastrointestinal constipation GERD Genitourinary UTI kidney stones neurogenic bladder Integumentary (Skin) left gluteal fold sacral Musculoskeletal cervical neuropathy osteomyelitis Psychiatric admitted for suicide risk on 12/02/2018 Objective Constitutional no acute distress. Vitals Time Taken: 10:40 AM, Height: 63 in, Weight: 185 lbs, BMI: 32.8, Temperature: 97.5 F, Pulse: 87 bpm, Respiratory Rate: 20 breaths/min, Blood Pressure: 125/74 mmHg. Respiratory Normal work of breathing on room air. General Notes: 05/08/2022: The right ischial wound has healed. The left ischial wound has separated into 2 separate sites. The more caudal is deeper and has nonviable tissue at the  base. The more cranial is more superficial, but still has some tissue at the lateral margin that shows signs of ongoing deep tissue pressure injury. Integumentary (Hair, Skin) Wound #12 status is Open. Original cause of wound was Pressure Injury. The date acquired was: 09/06/2018. The wound has been in treatment 175 weeks. The wound is located on the Left Ischial Tuberosity. The wound measures 3.5cm length x 4cm width x 1.6cm depth; 10.996cm^2 area and 17.593cm^3 volume. There is Fat Layer (Subcutaneous Tissue) exposed. There is no tunneling noted, however, there is undermining starting at 3:00 and ending at 4:00 with a maximum distance of 1.4cm. There is a medium amount of purulent drainage noted. The wound margin is well defined and not attached to the wound base. There is large (67-100%) pink granulation within the wound bed. There is a small (1-33%) amount of necrotic tissue within the wound bed including Adherent Slough. The periwound skin appearance had no abnormalities noted for color. The periwound skin appearance exhibited: Scarring, Dry/Scaly. The periwound skin appearance did not exhibit: Callus, Crepitus, Excoriation, Induration, Rash, Maceration. Periwound temperature was noted as No Abnormality. Wound #15 status is Open. Original cause of wound was Pressure Injury. The date acquired was: 07/21/2021. The wound has been in treatment 40 weeks. The wound is located on the Right Ischium. The wound measures 0cm length x 0cm width x 0cm depth; 0cm^2 area and 0cm^3 volume. There is no tunneling or undermining noted. There is a none present amount of drainage noted. There is no granulation within the wound bed. There is no necrotic tissue within the wound bed. The periwound skin appearance exhibited: Scarring. The periwound skin appearance did not exhibit: Callus, Crepitus, Excoriation, Induration, Rash, Dry/Scaly, Maceration, Atrophie Blanche, Cyanosis, Ecchymosis, Hemosiderin Staining, Mottled,  Pallor, Rubor, Erythema. Periwound temperature was noted as No Abnormality. Wound #16 status is Open. Original cause of wound was Pressure Injury. The date acquired was: 05/08/2022. The wound is located on the Left,Lateral Ischium. The wound measures 3.4cm length x 1.3cm width x 4.5cm depth; 3.471cm^2 area and 15.622cm^3 volume. There is Fat Layer (Subcutaneous Tissue) exposed. TENIA, GOH ([id number]) [id number] Page 15 of 18 There is  no tunneling or undermining noted. There is a medium amount of purulent drainage noted. The wound margin is distinct with the outline attached to the wound base. There is large (67-100%) red granulation within the wound bed. There is no necrotic tissue within the wound bed. The periwound skin appearance had no abnormalities noted for moisture. The periwound skin appearance had no abnormalities noted for color. The periwound skin appearance exhibited: Scarring. Periwound temperature was noted as No Abnormality. General Notes: green tinged drainage Assessment Active Problems ICD-10 Pressure ulcer of left buttock, stage 4 Type 2 diabetes mellitus with other skin ulcer Procedures Wound #12 Pre-procedure diagnosis of Wound #12 is a Pressure Ulcer located on the Left Ischial Tuberosity . There was a Excisional Skin/Subcutaneous Tissue Debridement with a total area of 14 sq cm performed by Fredirick Maudlin, MD. With the following instrument(s): Curette to remove Viable and Non-Viable tissue/material. Material removed includes Subcutaneous Tissue and Slough and after achieving pain control using Lidocaine 4% T opical Solution. No specimens were taken. A time out was conducted at 11:15, prior to the start of the procedure. A Minimum amount of bleeding was controlled with Pressure. The procedure was tolerated well with a pain level of 3 throughout and a pain level of 0 following the procedure. Post Debridement Measurements: 3.5cm length x  4cm width x 1.6cm depth; 17.593cm^3 volume. Post debridement Stage noted as Category/Stage IV. Character of Wound/Ulcer Post Debridement is improved. Post procedure Diagnosis Wound #12: Same as Pre-Procedure General Notes: scribed by Baruch Gouty, RN for Dr. Celine Ahr. Wound #16 Pre-procedure diagnosis of Wound #16 is a Pressure Ulcer located on the Left,Lateral Ischium . There was a Excisional Skin/Subcutaneous Tissue Debridement with a total area of 4.42 sq cm performed by Fredirick Maudlin, MD. With the following instrument(s): Curette to remove Viable and Non-Viable tissue/material. Material removed includes Subcutaneous Tissue and Slough and after achieving pain control using Lidocaine 4% T opical Solution. No specimens were taken. A time out was conducted at 11:15, prior to the start of the procedure. A Minimum amount of bleeding was controlled with Pressure. The procedure was tolerated well with a pain level of 3 throughout and a pain level of 0 following the procedure. Post Debridement Measurements: 3.4cm length x 1.3cm width x 4.5cm depth; 15.622cm^3 volume. Post debridement Stage noted as Category/Stage III. Character of Wound/Ulcer Post Debridement is improved. Post procedure Diagnosis Wound #16: Same as Pre-Procedure General Notes: scribed by Baruch Gouty, RN for Dr. Celine Ahr. Plan Follow-up Appointments: Return appointment in 1 month. - Dr. Celine Ahr - Room 1 ****[facility]**** Anesthetic: Wound #12 Left Ischial Tuberosity: (In clinic) Topical Lidocaine 4% applied to wound bed Bathing/ Shower/ Hygiene: May shower and wash wound with soap and water. - with dressing changes Off-Loading: Air fluidized (Group 3) mattress - Medical Modalities Turn and reposition every 2 hours - *** Try to shift from side to side and shift to belly (if tolerated),****avoid sitting up in bed except for meals Additional Orders / Instructions: Follow Nutritious Diet - protein shakes 2-3 times per day, Juven 2  times per day Home Health: No change in wound care orders this week; continue Home Health for wound care. May utilize formulary equivalent dressing for wound treatment orders unless otherwise specified. - Mix Santyl and Gentamicin together and apply to wound beds. Moisten gauze with Dakins solution and apply to wound beds. Cover with zetuvit borders. Dressing changes to be completed by Home Health on Monday / Wednesday / Friday except when patient has scheduled visit  at [place]. - increase visits to 3 times per week (spouse having difficulty with dressing changes) Other Home Health Orders/Instructions: - [facility] WOUND #12: - Ischial Tuberosity Wound Laterality: Left Cleanser: Soap and Water 1 x Per Day/30 Days Discharge Instructions: May shower and wash wound with dial antibacterial soap and water prior to dressing change. Cleanser: Wound Cleanser 1 x Per Day/30 Days Discharge Instructions: Cleanse the wound with wound cleanser prior to applying a clean dressing using gauze sponges, not tissue or cotton balls. Peri-Wound Care: Skin Prep (Generic) 1 x Per Day/30 Days Discharge Instructions: Use skin prep as directed Topical: Gentamicin 1 x Per Day/30 Days Discharge Instructions: As directed by physician Prim Dressing: Dakin's Solution 0.25%, 16 (oz) 1 x Per Day/30 Days ary Discharge Instructions: Moisten gauze with Dakin's solution Prim Dressing: Santyl Ointment 1 x Per Day/30 Days ary Discharge Instructions: Apply nickel thick amount to wound bed as instructed Prim Dressing: Medline Woven Gauze Sponges 4x4 (in/in) 1 x Per Day/30 Days ary Discharge Instructions: moisten with saline and pack lightly into wound Secondary Dressing: Zetuvit Plus Silicone Border Dressing 5x5 (in/in) (Generic) 1 x Per Day/30 Days Discharge Instructions: Apply silicone border or ABD padover primary dressing as directed. [name], [name] ([id number]) [id number] Page 16 of  18 WOUND #16: - Ischium Wound Laterality: Left, Lateral Cleanser: Soap and Water 1 x Per Day/30 Days Discharge Instructions: May shower and wash wound with dial antibacterial soap and water prior to dressing change. Cleanser: Wound Cleanser 1 x Per Day/30 Days Discharge Instructions: Cleanse the wound with wound cleanser prior to applying a clean dressing using gauze sponges, not tissue or cotton balls. Peri-Wound Care: Skin Prep (Generic) 1 x Per Day/30 Days Discharge Instructions: Use skin prep as directed Topical: Gentamicin 1 x Per Day/30 Days Discharge Instructions: As directed by physician Prim Dressing: Dakin's Solution 0.25%, 16 (oz) 1 x Per Day/30 Days ary Discharge Instructions: Moisten gauze with Dakin's solution Prim Dressing: Santyl Ointment 1 x Per Day/30 Days ary Discharge Instructions: Apply nickel thick amount to wound bed as instructed Prim Dressing: Medline Woven Gauze Sponges 4x4 (in/in) 1 x Per Day/30 Days ary Discharge Instructions: moisten with saline and pack lightly into wound Secondary Dressing: Zetuvit Plus Silicone Border Dressing 5x5 (in/in) (Generic) 1 x Per Day/30 Days Discharge Instructions: Apply silicone border or ABD padover primary dressing as directed. 05/08/2022: The right ischial wound has healed. The left ischial wound has separated into 2 separate sites. The more caudal is deeper and has nonviable tissue at the base. The more cranial is more superficial, but still has some tissue at the lateral margin that shows signs of ongoing deep tissue pressure injury. I used a curette to debride slough and nonviable subcutaneous tissue from both of her wounds. I think the healing of the right ischial ulcer and the improvement we are seeing has much to do with the group 3 mattress she is currently on. We will continue topical gentamicin and Santyl to both sites, packing the cavities with Dakin's moistened gauze. Follow-up in 1 month. Electronic  Signature(s) Signed: 05/08/2022 12:04:37 PM By: Fredirick Maudlin MD FACS Entered By: Fredirick Maudlin on 05/08/2022 12:04:37 -------------------------------------------------------------------------------- HxROS Details Patient Name: Date of Service: [name], [name]. 05/08/2022 10:30 A M Medical Record Number: [id number] Patient Account Number: [id number] Date of Birth/Sex: Treating RN: Apr 05, 1951 (72 y.o. F) Primary Care Provider: [name] Other Clinician: Referring Provider: Treating Provider/Extender: Nance Pew in Treatment: 175 Information Obtained From  Patient Eyes Medical History: Negative for: Cataracts; Glaucoma; Optic Neuritis Ear/Nose/Mouth/Throat Medical History: Negative for: Chronic sinus problems/congestion; Middle ear problems Hematologic/Lymphatic Medical History: Positive for: Anemia Negative for: Hemophilia; Human Immunodeficiency Virus; Lymphedema; Sickle Cell Disease Respiratory Medical History: Negative for: Aspiration; Asthma; Chronic Obstructive Pulmonary Disease (COPD); Pneumothorax; Sleep Apnea; Tuberculosis Cardiovascular Medical History: Positive for: Hypertension Negative for: Angina; Arrhythmia; Congestive Heart Failure; Coronary Artery Disease; Deep Vein Thrombosis; Hypotension; Myocardial Infarction; Peripheral Arterial Disease; Peripheral Venous Disease; Phlebitis; Vasculitis [name], [name] ([id number]) [id number] Page 17 of 18 Gastrointestinal Medical History: Negative for: Cirrhosis ; Colitis; Crohns; Hepatitis A; Hepatitis B; Hepatitis C Past Medical History Notes: constipation GERD Endocrine Medical History: Positive for: Type II Diabetes Negative for: Type I Diabetes Time with diabetes: 1 year Treated with: Oral agents Blood sugar tested every day: No Genitourinary Medical History: Negative for: End Stage Renal Disease Past Medical History Notes: UTI kidney  stones neurogenic bladder Immunological Medical History: Negative for: Lupus Erythematosus; Raynauds; Scleroderma Integumentary (Skin) Medical History: Negative for: History of Burn Past Medical History Notes: left gluteal fold sacral Musculoskeletal Medical History: Positive for: Osteoarthritis Negative for: Gout; Rheumatoid Arthritis; Osteomyelitis Past Medical History Notes: cervical neuropathy osteomyelitis Neurologic Medical History: Positive for: Dementia; Paraplegia Negative for: Neuropathy; Quadriplegia Oncologic Medical History: Positive for: Received Radiation - 2012 - right breast Psychiatric Medical History: Negative for: Anorexia/bulimia; Confinement Anxiety Past Medical History Notes: admitted for suicide risk on 12/02/2018 Immunizations Pneumococcal Vaccine: Received Pneumococcal Vaccination: Yes Received Pneumococcal Vaccination On or After 60th Birthday: No Implantable Devices None Hospitalization / Surgery History Type of Hospitalization/Surgery left knee replacement Winecoff, [name] ([id number]) [id number] Page 18 of 18 right breast lumpectomy cervical laminectomy with fusion hysterectomy tear duct surgery Tand A UTI suprapubic cath placement UTI Family and Social History Unknown History: Yes; Cancer: Yes - Mother; Diabetes: Yes - Maternal Grandparents; Heart Disease: Yes - Mother; Hereditary Spherocytosis: No; Hypertension: Yes - Mother; Kidney Disease: No; Lung Disease: No; Seizures: No; Stroke: No; Thyroid Problems: Yes - Mother; Tuberculosis: No; Former smoker - ended on 11/06/2014; Marital Status - Married; Alcohol Use: Never; Drug Use: No History; Caffeine Use: Daily - T soda; Financial Concerns: No; ea, Food, Clothing or Shelter Needs: No; Support System Lacking: No; Transportation Concerns: No Electronic Signature(s) Signed: 05/08/2022 12:21:08 PM By: Fredirick Maudlin MD FACS Entered By: Fredirick Maudlin on  05/08/2022 11:54:49 -------------------------------------------------------------------------------- SuperBill Details Patient Name: Date of Service: [name] 05/08/2022 Medical Record Number: [id number] Patient Account Number: [id number] Date of Birth/Sex: Treating RN: Jan 28, 1951 (72 y.o. F) Primary Care Provider: [name] Other Clinician: Referring Provider: Treating Provider/Extender: Burgess Amor Weeks in Treatment: 175 Diagnosis Coding ICD-10 Codes Code Description 8197054077 Pressure ulcer of left buttock, stage 4 E11.622 Type 2 diabetes mellitus with other skin ulcer Facility Procedures : The patient participates with Medicare or their insurance follows the Medicare Facility Guidelines: CPT4 Code Description Modifier Quantity [id number] [place] TISSUE 20 SQ CM/< 1 ICD-10 Diagnosis Description 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,  stage 4 Physician Procedures : CPT4 Code Description Modifier [id number] - WC PHYS LEVEL 4 - EST PT 25 ICD-10 Diagnosis Description 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, stage 4 E11.622 Type 2 diabetes mellitus with other skin ulcer Quantity: 1 : [id number] - WC PHYS SUBQ TISS 20 SQ CM ICD-10 Diagnosis Description 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, stage 4 Quantity: 1 Electronic Signature(s) Signed: 05/08/2022 12:05:45 PM By: Fredirick Maudlin MD FACS Entered By: Celine Ahr,  Ainsley Sanguinetti on 05/08/2022 12:05:45

## 2022-05-08 NOTE — Progress Notes (Signed)
[name], [name] ([id number]) [id number] Page 1 of 11 Visit Report for 05/08/2022 Arrival Information Details Patient Name: Date of Service: [name], [name]. 05/08/2022 10:30 A M Medical Record Number: [id number] Patient Account Number: [id number] Date of Birth/Sex: Treating RN: 24-Mar-1951 (72 y.o. F) Primary Care Ulanda Tackett: [name] Other Clinician: Referring Ruchi Stoney: Treating Palmyra Rogacki/Extender: Nance Pew in Treatment: 60 Visit Information History Since Last Visit All ordered tests and consults were completed: No Patient Arrived: Wheel Chair Added or deleted any medications: No Arrival Time: 10:40 Any new allergies or adverse reactions: No Accompanied By: husband Had a fall or experienced change in No Transfer Assistance: [employer] activities of daily living that may affect Patient Identification Verified: Yes risk of falls: Secondary Verification Process Completed: Yes Signs or symptoms of abuse/neglect since last visito No Patient Requires Transmission-Based Precautions: No Hospitalized since last visit: No Patient Has Alerts: No Implantable device outside of the clinic excluding No cellular tissue based products placed in the center since last visit: Pain Present Now: No Electronic Signature(s) Signed: 05/08/2022 3:28:28 PM By: Worthy Rancher Entered By: Worthy Rancher on 05/08/2022 10:40:30 -------------------------------------------------------------------------------- Encounter Discharge Information Details Patient Name: Date of Service: [name]. 05/08/2022 10:30 A M Medical Record Number: [id number] Patient Account Number: [id number] Date of Birth/Sex: Treating RN: December 12, 1950 (72 y.o. [name] Primary Care Gunnar Hereford: [name] Other Clinician: Referring Reisha Wos: Treating Tyniya Kuyper/Extender: Nance Pew in Treatment: 175 Encounter Discharge Information Items Post Procedure  Vitals Discharge Condition: Stable Temperature (F): 97.5 Ambulatory Status: Wheelchair Pulse (bpm): 87 Discharge Destination: Home Respiratory Rate (breaths/min): 18 Transportation: Private Auto Blood Pressure (mmHg): 125/74 Accompanied By: spouse Schedule Follow-up Appointment: Yes Clinical Summary of Care: Patient Declined Electronic Signature(s) Signed: 05/08/2022 4:55:33 PM By: Baruch Gouty RN, BSN Entered By: Baruch Gouty on 05/08/2022 11:43:34 [place], [name] ([id number]) [id number] Page 2 of 11 -------------------------------------------------------------------------------- Lower Extremity Assessment Details Patient Name: Date of Service: [name], [name] 05/08/2022 10:30 A M Medical Record Number: [id number] Patient Account Number: [id number] Date of Birth/Sex: Treating RN: 06-Jan-1951 (72 y.o. [name] Primary Care Hunter Pinkard: [name] Other Clinician: Referring Leilani Cespedes: Treating Marco Adelson/Extender: Nance Pew in Treatment: 175 Electronic Signature(s) Signed: 05/08/2022 4:55:33 PM By: Baruch Gouty RN, BSN Entered By: Baruch Gouty on 05/08/2022 11:07:32 -------------------------------------------------------------------------------- Multi Wound Chart Details Patient Name: Date of Service: [name], [name]. 05/08/2022 10:30 A M Medical Record Number: [id number] Patient Account Number: [id number] Date of Birth/Sex: Treating RN: 03-18-1951 (72 y.o. F) Primary Care Kele Withem: [name] Other Clinician: Referring Jinny Sweetland: Treating Jordynn Perrier/Extender: Nance Pew in Treatment: 175 Vital Signs Height(in): 65 Pulse(bpm): 81 Weight(lbs): 185 Blood Pressure(mmHg): 125/74 Body Mass Index(BMI): 32.8 Temperature(F): 97.5 Respiratory Rate(breaths/min): 20 [12:Photos:] Left Ischial Tuberosity Right Ischium Left, Lateral Ischium Wound Location: Pressure Injury Pressure Injury Pressure  Injury Wounding Event: Pressure Ulcer Pressure Ulcer Pressure Ulcer Primary Etiology: Anemia, Hypertension, Type II Anemia, Hypertension, Type II Anemia, Hypertension, Type II Comorbid History: Diabetes, Osteoarthritis, Dementia, Diabetes, Osteoarthritis, Dementia, Diabetes, Osteoarthritis, Dementia, Paraplegia, Received Radiation Paraplegia, Received Radiation Paraplegia, Received Radiation 09/06/2018 07/21/2021 05/08/2022 Date Acquired: 175 40 0 Weeks of Treatment: Open Open Open Wound Status: No No No Wound Recurrence: 3.5x4x1.6 0x0x0 3.4x1.3x4.5 Measurements L x W x D (cm) 10.996 0 3.471 A (cm) : rea 17.593 0 15.622 Volume (cm) : -4559.30% 100.00% N/A % Reduction in A rea: -8198.60% 100.00% N/A % Reduction in Volume: 3 Starting Position 1 (  o'clock): 4 Ending Position 1 (o'clock): 1.4 Maximum Distance 1 (cm): Yes No No Undermining: Category/Stage IV Category/Stage III Category/Stage III Classification: Medium None Present Medium Exudate A mount: Purulent N/A Purulent Exudate Type: Sieler, [name] ([id number]) [id number] Page 3 of 11 yellow, brown, green N/A yellow, brown, green Exudate Color: Well defined, not attached N/A Distinct, outline attached Wound Margin: Large (67-100%) None Present (0%) Large (67-100%) Granulation Amount: Pink N/A Red Granulation Quality: Small (1-33%) None Present (0%) None Present (0%) Necrotic Amount: Fat Layer (Subcutaneous Tissue): Yes Fascia: No Fat Layer (Subcutaneous Tissue): Yes Exposed Structures: Fascia: No Fat Layer (Subcutaneous Tissue): No Fascia: No Tendon: No Tendon: No Tendon: No Muscle: No Muscle: No Muscle: No Joint: No Joint: No Joint: No Bone: No Bone: No Bone: No Small (1-33%) Large (67-100%) Small (1-33%) Epithelialization: Debridement - Excisional N/A Debridement - Excisional Debridement: Pre-procedure Verification/Time Out 11:15 N/A 11:15 Taken: Lidocaine 4%  Topical Solution N/A Lidocaine 4% Topical Solution Pain Control: Subcutaneous, Slough N/A Subcutaneous, Slough Tissue Debrided: Skin/Subcutaneous Tissue N/A Skin/Subcutaneous Tissue Level: 14 N/A 4.42 Debridement A (sq cm): rea Curette N/A Curette Instrument: Minimum N/A Minimum Bleeding: Pressure N/A Pressure Hemostasis A chieved: 3 N/A 3 Procedural Pain: 0 N/A 0 Post Procedural Pain: Procedure was tolerated well N/A Procedure was tolerated well Debridement Treatment Response: 3.5x4x1.6 N/A 3.4x1.3x4.5 Post Debridement Measurements L x W x D (cm) 17.593 N/A 15.622 Post Debridement Volume: (cm) Category/Stage IV N/A Category/Stage III Post Debridement Stage: Scarring: Yes Scarring: Yes Scarring: Yes Periwound Skin Texture: Excoriation: No Excoriation: No Induration: No Induration: No Callus: No Callus: No Crepitus: No Crepitus: No Rash: No Rash: No Dry/Scaly: Yes Maceration: No No Abnormalities Noted Periwound Skin Moisture: Maceration: No Dry/Scaly: No Atrophie Blanche: No Atrophie Blanche: No No Abnormalities Noted Periwound Skin Color: Cyanosis: No Cyanosis: No Ecchymosis: No Ecchymosis: No Erythema: No Erythema: No Hemosiderin Staining: No Hemosiderin Staining: No Mottled: No Mottled: No Pallor: No Pallor: No Rubor: No Rubor: No No Abnormality No Abnormality No Abnormality Temperature: N/A N/A green tinged drainage Assessment Notes: Debridement N/A Debridement Procedures Performed: Treatment Notes Wound #12 (Ischial Tuberosity) Wound Laterality: Left Cleanser Soap and Water Discharge Instruction: May shower and wash wound with dial antibacterial soap and water prior to dressing change. Wound Cleanser Discharge Instruction: Cleanse the wound with wound cleanser prior to applying a clean dressing using gauze sponges, not tissue or cotton balls. Peri-Wound Care Skin Prep Discharge Instruction: Use skin prep as  directed Topical Gentamicin Discharge Instruction: As directed by physician Primary Dressing Dakin's Solution 0.25%, 16 (oz) Discharge Instruction: Moisten gauze with Dakin's solution Santyl Ointment Discharge Instruction: Apply nickel thick amount to wound bed as instructed Medline Woven Gauze Sponges 4x4 (in/in) Discharge Instruction: moisten with saline and pack lightly into wound Secondary Dressing Zetuvit Plus Silicone Border Dressing 5x5 (in/in) Discharge Instruction: Apply silicone border or ABD padover primary dressing as directed. Secured With EUSTOLIA, DRENNEN V ([id number]) [id number] Page 4 of 11 Compression Wrap Compression Stockings Add-Ons Wound #15 (Ischium) Wound Laterality: Right Cleanser Peri-Wound Care Topical Primary Dressing Secondary Dressing Secured With Compression Wrap Compression Stockings Add-Ons Wound #16 (Ischium) Wound Laterality: Left, Lateral Cleanser Soap and Water Discharge Instruction: May shower and wash wound with dial antibacterial soap and water prior to dressing change. Wound Cleanser Discharge Instruction: Cleanse the wound with wound cleanser prior to applying a clean dressing using gauze sponges, not tissue or cotton balls. Peri-Wound Care Skin Prep Discharge Instruction: Use skin prep as directed Topical Gentamicin Discharge Instruction: As directed  by physician Primary Dressing Dakin's Solution 0.25%, 16 (oz) Discharge Instruction: Moisten gauze with Dakin's solution Santyl Ointment Discharge Instruction: Apply nickel thick amount to wound bed as instructed Medline Woven Gauze Sponges 4x4 (in/in) Discharge Instruction: moisten with saline and pack lightly into wound Secondary Dressing Zetuvit Plus Silicone Border Dressing 5x5 (in/in) Discharge Instruction: Apply silicone border or ABD padover primary dressing as directed. Secured With Compression Wrap Compression Stockings Sport and exercise psychologist) Signed: 05/08/2022 11:50:04 AM By: Fredirick Maudlin MD FACS Entered By: Fredirick Maudlin on 05/08/2022 11:50:03 Multi-Disciplinary Care Plan Details -------------------------------------------------------------------------------- [name] ([id number]) [id number] Page 5 of 11 Patient Name: Date of Service: LULA, KOLTON. 05/08/2022 10:30 A M Medical Record Number: [id number] Patient Account Number: [id number] Date of Birth/Sex: Treating RN: 13-Nov-1950 (72 y.o. [name] Primary Care Locklyn Henriquez: [name] Other Clinician: Referring Jahmani Staup: Treating Leotha Voeltz/Extender: Nance Pew in Treatment: [place] reviewed with physician Active Inactive Pressure Nursing Diagnoses: Knowledge deficit related to management of pressures ulcers Potential for impaired tissue integrity related to pressure, friction, moisture, and shear Goals: Patient/caregiver will verbalize understanding of pressure ulcer management Date Initiated: 05/22/2021 Target Resolution Date: 06/06/2022 Goal Status: Active Interventions: Assess: immobility, friction, shearing, incontinence upon admission and as needed Assess offloading mechanisms upon admission and as needed Notes: Wound/Skin Impairment Nursing Diagnoses: Knowledge deficit related to ulceration/compromised skin integrity Goals: Patient/caregiver will verbalize understanding of skin care regimen Date Initiated: 12/30/2018 Target Resolution Date: 06/05/2022 Goal Status: Active Interventions: Assess patient/caregiver ability to obtain necessary supplies Assess patient/caregiver ability to perform ulcer/skin care regimen upon admission and as needed Assess ulceration(s) every visit Provide education on ulcer and skin care Treatment Activities: Skin care regimen initiated : 12/30/2018 Topical wound management initiated : 12/30/2018 Notes: 01/08/21: Wound care  regimen continues Electronic Signature(s) Signed: 05/08/2022 4:55:33 PM By: Baruch Gouty RN, BSN Entered By: Baruch Gouty on 05/08/2022 11:12:46 -------------------------------------------------------------------------------- Pain Assessment Details Patient Name: Date of Service: [name]. 05/08/2022 10:30 A M Medical Record Number: [id number] Patient Account Number: [id number] Date of Birth/Sex: Treating RN: 08-21-1950 (71 y.o. F) Primary Care Marketa Midkiff: [name] Other Clinician: Referring Nihal Doan: Treating Jhoan Schmieder/Extender: Nance Pew in Treatment: [address], [name] ([id number]) [id number] Page 6 of 11 Location of Pain Severity and Description of Pain Patient Has Paino No Site Locations Pain Management and Medication Current Pain Management: Electronic Signature(s) Signed: 05/08/2022 3:28:28 PM By: Worthy Rancher Entered By: Worthy Rancher on 05/08/2022 10:41:00 -------------------------------------------------------------------------------- Patient/Caregiver Education Details Patient Name: Date of Service: [name] 2/1/2024andnbsp10:30 [place] Record Number: [id number] Patient Account Number: [id number] Date of Birth/Gender: Treating RN: Feb 22, 1951 (72 y.o. [name] Primary Care Physician: [name] Other Clinician: Referring Physician: Treating Physician/Extender: Nance Pew in Treatment: 175 Education Assessment Education Provided To: Patient Education Topics Provided Pressure: Methods: Explain/Verbal Responses: Reinforcements needed, State content correctly Wound/Skin Impairment: Methods: Explain/Verbal Responses: Reinforcements needed, State content correctly Electronic Signature(s) Signed: 05/08/2022 4:55:33 PM By: Baruch Gouty RN, BSN Entered By: Baruch Gouty on 05/08/2022 11:13:50 [place], [name] ([id number])  [id number] Page 7 of 11 -------------------------------------------------------------------------------- Wound Assessment Details Patient Name: Date of Service: CATHLENE, GARDELLA 05/08/2022 10:30 A M Medical Record Number: [id number] Patient Account Number: [id number] Date of Birth/Sex: Treating RN: [birth year] (72 y.o. [name] Primary Care Bently Morath: [name] Other Clinician: Referring Sherilee Smotherman: Treating Mitsugi Schrader/Extender: Burgess Amor Weeks in Treatment: 175 Wound Status  Wound Number: 12 Primary Pressure Ulcer Etiology: Wound Location: Left Ischial Tuberosity Wound Open Wounding Event: Pressure Injury Status: Date Acquired: 09/06/2018 Comorbid Anemia, Hypertension, Type II Diabetes, Osteoarthritis, Weeks Of Treatment: 175 History: Dementia, Paraplegia, Received Radiation Clustered Wound: No Photos Wound Measurements Length: (cm) 3.5 Width: (cm) 4 Depth: (cm) 1.6 Area: (cm) 10.996 Volume: (cm) 17.593 % Reduction in Area: -4559.3% % Reduction in Volume: -8198.6% Epithelialization: Small (1-33%) Tunneling: No Undermining: Yes Starting Position (o'clock): 3 Ending Position (o'clock): 4 Maximum Distance: (cm) 1.4 Wound Description Classification: Category/Stage IV Wound Margin: Well defined, not attached Exudate Amount: Medium Exudate Type: Purulent Exudate Color: yellow, brown, green Foul Odor After Cleansing: No Slough/Fibrino Yes Wound Bed Granulation Amount: Large (67-100%) Exposed Structure Granulation Quality: Pink Fascia Exposed: No Necrotic Amount: Small (1-33%) Fat Layer (Subcutaneous Tissue) Exposed: Yes Necrotic Quality: Adherent Slough Tendon Exposed: No Muscle Exposed: No Joint Exposed: No Bone Exposed: No Periwound Skin Texture Texture Color No Abnormalities Noted: No No Abnormalities Noted: Yes Callus: No Temperature / Pain Crepitus: No Temperature: No Abnormality Excoriation:  No Induration: No Rash: No Scarring: Yes Moisture Horace, [name] ([id number]) [id number] Page 8 of 11 No Abnormalities Noted: No Dry / Scaly: Yes Maceration: No Treatment Notes Wound #12 (Ischial Tuberosity) Wound Laterality: Left Cleanser Soap and Water Discharge Instruction: May shower and wash wound with dial antibacterial soap and water prior to dressing change. Wound Cleanser Discharge Instruction: Cleanse the wound with wound cleanser prior to applying a clean dressing using gauze sponges, not tissue or cotton balls. Peri-Wound Care Skin Prep Discharge Instruction: Use skin prep as directed Topical Gentamicin Discharge Instruction: As directed by physician Primary Dressing Dakin's Solution 0.25%, 16 (oz) Discharge Instruction: Moisten gauze with Dakin's solution Santyl Ointment Discharge Instruction: Apply nickel thick amount to wound bed as instructed Medline Woven Gauze Sponges 4x4 (in/in) Discharge Instruction: moisten with saline and pack lightly into wound Secondary Dressing Zetuvit Plus Silicone Border Dressing 5x5 (in/in) Discharge Instruction: Apply silicone border or ABD padover primary dressing as directed. Secured With Compression Wrap Compression Stockings Add-Ons Electronic Signature(s) Signed: 05/08/2022 3:28:28 PM By: Worthy Rancher Signed: 05/08/2022 4:55:33 PM By: Baruch Gouty RN, BSN Entered By: Worthy Rancher on 05/08/2022 11:08:07 -------------------------------------------------------------------------------- Wound Assessment Details Patient Name: Date of Service: [name]. 05/08/2022 10:30 A M Medical Record Number: [id number] Patient Account Number: [id number] Date of Birth/Sex: Treating RN: 11/30/1950 (72 y.o. [name] Primary Care Mikinzie Maciejewski: [name] Other Clinician: Referring Murrell Dome: Treating Keisa Blow/Extender: Burgess Amor Weeks in Treatment: 175 Wound Status Wound Number: 15  Primary Pressure Ulcer Etiology: Wound Location: Right Ischium Wound Open Wounding Event: Pressure Injury Status: Date Acquired: 07/21/2021 Comorbid Anemia, Hypertension, Type II Diabetes, Osteoarthritis, Weeks Of Treatment: 40 History: Dementia, Paraplegia, Received Radiation Clustered Wound: No Photos REBECCA, CAIRNS ([id number]) [id number] Page 9 of 11 Wound Measurements Length: (cm) Width: (cm) Depth: (cm) Area: (cm) Volume: (cm) 0 % Reduction in Area: 100% 0 % Reduction in Volume: 100% 0 Epithelialization: Large (67-100%) 0 Tunneling: No 0 Undermining: No Wound Description Classification: Category/Stage III Exudate Amount: None Present Foul Odor After Cleansing: No Slough/Fibrino No Wound Bed Granulation Amount: None Present (0%) Exposed Structure Necrotic Amount: None Present (0%) Fascia Exposed: No Fat Layer (Subcutaneous Tissue) Exposed: No Tendon Exposed: No Muscle Exposed: No Joint Exposed: No Bone Exposed: No Periwound Skin Texture Texture Color No Abnormalities Noted: No No Abnormalities Noted: No Callus: No Atrophie Blanche: No Crepitus: No Cyanosis: No Excoriation: No Ecchymosis: No Induration: No  Erythema: No Rash: No Hemosiderin Staining: No Scarring: Yes Mottled: No Pallor: No Moisture Rubor: No No Abnormalities Noted: No Dry / Scaly: No Temperature / Pain Maceration: No Temperature: No Abnormality Electronic Signature(s) Signed: 05/08/2022 4:55:33 PM By: Baruch Gouty RN, BSN Entered By: Baruch Gouty on 05/08/2022 11:07:14 -------------------------------------------------------------------------------- Wound Assessment Details Patient Name: Date of Service: [name]. 05/08/2022 10:30 A M Medical Record Number: [id number] Patient Account Number: [id number] Date of Birth/Sex: Treating RN: 28-May-1950 (72 y.o. [name] Primary Care Kyng Matlock: [name] Other Clinician: Referring  Verena Shawgo: Treating Laurier Jasperson/Extender: Burgess Amor Weeks in Treatment: 175 Wound Status Wound Number: 16 Primary Pressure Ulcer Etiology: Wound Location: Left, Lateral Ischium Wound Open Wounding Event: Pressure Injury Status: MESSINA, KOSINSKI ([id number]) [id number] Page 10 of 11 Status: Date Acquired: 05/08/2022 Comorbid Anemia, Hypertension, Type II Diabetes, Osteoarthritis, Weeks Of Treatment: 0 History: Dementia, Paraplegia, Received Radiation Clustered Wound: No Photos Wound Measurements Length: (cm) 3.4 Width: (cm) 1.3 Depth: (cm) 4.5 Area: (cm) 3.471 Volume: (cm) 15.622 % Reduction in Area: % Reduction in Volume: Epithelialization: Small (1-33%) Tunneling: No Undermining: No Wound Description Classification: Category/Stage III Wound Margin: Distinct, outline attached Exudate Amount: Medium Exudate Type: Purulent Exudate Color: yellow, brown, green Foul Odor After Cleansing: No Slough/Fibrino Yes Wound Bed Granulation Amount: Large (67-100%) Exposed Structure Granulation Quality: Red Fascia Exposed: No Necrotic Amount: None Present (0%) Fat Layer (Subcutaneous Tissue) Exposed: Yes Tendon Exposed: No Muscle Exposed: No Joint Exposed: No Bone Exposed: No Periwound Skin Texture Texture Color No Abnormalities Noted: No No Abnormalities Noted: Yes Scarring: Yes Temperature / Pain Temperature: No Abnormality Moisture No Abnormalities Noted: Yes Assessment Notes green tinged drainage Treatment Notes Wound #16 (Ischium) Wound Laterality: Left, Lateral Cleanser Soap and Water Discharge Instruction: May shower and wash wound with dial antibacterial soap and water prior to dressing change. Wound Cleanser Discharge Instruction: Cleanse the wound with wound cleanser prior to applying a clean dressing using gauze sponges, not tissue or cotton balls. Peri-Wound Care Skin Prep Discharge Instruction: Use skin prep as  directed Topical Gentamicin Discharge Instruction: As directed by physician Primary Dressing Dakin's Solution 0.25%, 16 (oz) Discharge Instruction: Moisten gauze with Dakin's solution [place], [name] ([id number]) [id number] Page 11 of 11 Santyl Ointment Discharge Instruction: Apply nickel thick amount to wound bed as instructed Medline Woven Gauze Sponges 4x4 (in/in) Discharge Instruction: moisten with saline and pack lightly into wound Secondary Dressing Zetuvit Plus Silicone Border Dressing 5x5 (in/in) Discharge Instruction: Apply silicone border or ABD padover primary dressing as directed. Secured With Compression Wrap Compression Stockings Add-Ons Electronic Signature(s) Signed: 05/08/2022 3:28:28 PM By: Worthy Rancher Signed: 05/08/2022 4:55:33 PM By: Baruch Gouty RN, BSN Entered By: Worthy Rancher on 05/08/2022 11:06:13 -------------------------------------------------------------------------------- Vitals Details Patient Name: Date of Service: [name], [name]. 05/08/2022 10:30 A M Medical Record Number: [id number] Patient Account Number: [id number] Date of Birth/Sex: Treating RN: 11-09-1950 (72 y.o. F) Primary Care Caprisha Bridgett: [name] Other Clinician: Referring Vishwa Dais: Treating Aliceson Dolbow/Extender: Nance Pew in Treatment: 175 Vital Signs Time Taken: 10:40 Temperature (F): 97.5 Height (in): 63 Pulse (bpm): 87 Weight (lbs): 185 Respiratory Rate (breaths/min): 20 Body Mass Index (BMI): 32.8 Blood Pressure (mmHg): 125/74 Reference Range: 80 - 120 mg / dl Electronic Signature(s) Signed: 05/08/2022 3:28:28 PM By: Worthy Rancher Entered By: Worthy Rancher on 05/08/2022 10:40:54

## 2022-05-19 NOTE — Progress Notes (Signed)
Media Information Document on 05/15/2022 9:22 PM by [name], NT: Wound Care Encounter 03/06/2022 Document Information  Procedure Note  Wound Care Encounter 03/06/2022  03/06/2022  Attached To:  Appointment on 03/06/22 with [name] Maudlin, MD  Source Information  [name], [name], NT  [facility]   [place], [place] ([id number]) Visit Report for [name], [name]. on 03/06/2022 Progress Note Details Patient Name: [name], [name]. Medical Record Number: [id number] Date of Birth/Sex: 09-May-1950 (72 y.o. F) Primary Care Provider: Sandi [name] Referring Provider: Antonieta [name] in Treatment: 166 Date of Service: 03/06/2022 10:30 AM Patient Account Number: [id number] Treating RN: [name] Other Clinician: Treating Provider/Extender: [name] Subjective Chief Complaint Information obtained from Patient She is here for follow up evaluation of left ischial pressure ulcer 12/30/2018; patient comes back forreview of wounds in the same general area over the previously healed left ischial pressure ulcer History of Present Illness (HPI) The following HPI elements were documented for the patient's wound: Location: open ulceration of the left gluteal area, left heel and right ankle for about 5 months. Quality: Patient reports No Pain. Severity: Patient states wound(s) are getting worse. Duration: Patient has had the wound for > 5 months prior to seeking treatment at the wound center Context: The wound occurred when the patient has been paraplegic for about 3 years. Modifying Factors: Wound improving due to current treatment. Associated Signs and Symptoms: Patient reports having foul odor. this 72 year old patient who is known to have hypertension, hypothyroidism, breast cancer, chronicpain syndrome, paraplegia was noted to have a left gluteal decubitus ulcer and was brought into thehospital. During the course of her hospitalization she was  debrided in the operating room by anklewound. Bone cultures were taken at that time but were negative but clinically she was treated forosteomyelitis because of the probing down to bone and open exposed bone. Home health has beengiving her antibioticss which include vancomycin and Zosyn. The patient was a smoker until about 3 weeks ago and used to smoke about 10 cigarettes a day fora long while. 12/13/2014 - details of her operative note from 11/03/2014 were reviewed -- PROCEDURE: 1.Excisional debridement skin, subcutaneous, muscle left ischium 35 cm2 2. Excisional debridement [name], [name] M. ([id number]) skin, subcutaneous tissue left heel 27 cm2 3. Excisional debridement right ankle skin, subcutaneous,bone 30 cm2 01/24/2015 -- she has some issues with her wheelchair cushion but other than that is doing verywell and has received Podus boots for her feet. 02/14/2015 -- she was using her old offloading boots and this seemed to have caused her a newpressure ulcer on the left posterior heel near the superior part just below the Achilles tendon. 03/07/2015 -- she has a new ulceration just to the left of the midline on her sacral region more onthe left buttock and this has been there for [name] and had all the wounds sharply debrided.The debridement was done for the left ischial wound, the left heel wound and the right about a week. 08/22/2015 -- was recently admitted to hospital between May 5 and 08/13/2015, with sepsis andleukocytosis due to a UTI. she was treated for a sepsis complicating [name] UTI and kidneystones. She also had metabolic and careful up at the secondary to pyelonephritis. He received broad-spectrum antibiotics initially and then received Macrobid as per urology. She was sent home onnitrofurantoin. during her admission she had a CT scan which showed exposed left ischial tuberositywithout evidence of osteolysis. 09/12/2015-- the patient is having some issues with her air  mattress  and would like to get a opinionfrom medical modalities. 10/10/2015 -- the issue with her air mattress has not yet been sorted out and the new problemseems to be a lot of odor from the wound VAC. 11/27/2015 -- the patient was admitted to the hospital between July 23 and 10/31/2015. Herproblems were sepsis, osteomyelitis of the pelvic bone and acute pyelonephritis. CT of the abdomenand pelvis was consistent with a left-sided pyelonephritis with hydronephrosis and also just showednew sclerosis of the posterior portion of the left anterior pubic ramus suggestive of periostealreaction consistent with osteomyelitis. She was treated for the osteomyelitis with infectious diseaseconsult recommending 6 weeks of IV antibiotics including vancomycin and Rocephin and theantibiotics were to go on until 12/10/2015. He was seen by [name] plastic surgery and KathrynComer of infectious disease. She had a suprapubic catheter placed during the admission. CT scan done on 10/28/2015 showed specifically -- New sclerosis of the posterior portion of the leftinferior pubic ramus with aggressive periosteal reaction, consistent with osteomyelitis, with adjacentsoft tissue gas compatible with previously described decubitus ulcer. 12/12/2015 -- she was recently seen by [name], who noted good improvement and CRP and ESRcompared to before and he has stopped her antibiotic as per plans to finish on September 4. Thepatient was encouraged to continue with wound care and consider hyperbaric oxygen therapy. Today she tells me that she has consented to undergo hyperbaric oxygen therapy and we can startthe paperwork. [name] ([id number]) 01/02/2016 -- her PCP had gained about 3 years but she still persists in having problems duringhyperbaric oxygen therapy with some discomfort in the ears. 01/09/16; pressure area with underlying osteomyelitis in the left buttock. Wound bed itself has someslight amount of grayish surface  slough however I do not think any debridement was necessary.There is no exposed bone soft tissue appears stable. She is using a wound VAC 01/16/16; back for weekly wound review in conjunction with HBO. She has a deep wound over theleft initial tuberosity previously treated with 6 weeks of IV antibiotics for osteomyelitis. Wound bedlooks reasonably healthy although the base of this is still precariously close to bone. She has beenusing a wound VAC. 01/23/2016 -- she has completed her course of antibiotics and this week the only new thing is herright great toe nail was avulsed and she has got an open wound over the nailbed. 01/31/16 she has completed her course of antibiotics. Her right great toenail avulsed last week andshe's been using silver alginate for this as well. Still using a wound VAC to the substantial stage IVwound over the left ischial tuberosity 03/05/2016 -- the patient has had a opinion from the plastic surgery group at [facility] of this are not available yet but the patient's verbal report has been heard by me. Did notsound like there was any optimistic discussion regarding reconstruction and the net result would beto continue with the wound VAC application. I will await the official reports. Addendum: -- she was seen at [place] surgery service by Dr. Tressa Busman.After a thorough review and from what I understand spending 45 minutes with the patient hisassessment has been noted by me in detail and the management options were: 1. Continued pressure offloading and wound care versus operative procedures including woundexcision 2. Soft tissue and bone sampling 3. If the wound gets larger wound closure would be done using a variety of plastic surgicaltechniques including but not limited to skin substitute, possible skin graft, local versus regional flaps,negative pressure dressing application. 4. He discussed  with her details of flap surgery and the risks  associated 5. He made a comment that since the patient was operated on by [name] of [facility] plastic surgery unit in [place] the patient may continue to follow-up there for furtherevaluation for surgical flap closure in the future. 03/19/2016 -- the patient continues to be rather depressed and frustrated with her lack of rapidprogress in healing this wound especially because she thought after hyperbaric oxygen therapy thewound would heal extremely fast. She now understands that was not the implied benefit on woundcare which was the recommendation for hyperbaric oxygen therapy. I have had a lengthy discussion with the patient and her husband regarding her options: 1. Continuewith collagen and wound VAC for the primary dressing and offloading and all supportive care. 2. SeeDr. Iran [name] for possible placement of Acell or Integra in the OR. 3. get a second opinion from a wound care center and surrounding regions/counties [name], [name] ([id number]) 05/07/2016 -- Note from Dr. Celedonio Miyamoto, who noted that the patient has declined flapsurgery. She has discussed application of A cell, and try a few applications to see how the woundprogresses. She is also recommended that we could apply products here in the wound center, likeOasis. during her preop workup it was found that her hemoglobin A1c was 11% and she has now beendiagnosed as having diabetes mellitus and has been put on appropriate treatment by her PCP 05/28/2016 -- tells me her blood sugars have been doing well and she has an appointment to see herPCP in the next couple of weeks to check her hemoglobin A1c. Other than that she continues to dowell. 06/25/2016 -- have not seen her back for the last month but she says her health has been about thesame and she has an appointment to check the A1c next week 09/10/16 ---- was seen by Dr. Celedonio Miyamoto -- who applied Acell and saw her back in follow-up.She has recommended silver  alginate to the wound every other day and cover with foam. If nosignificant drainage could transition to collagen every other day. She recommended discontinuingwound VAC. There were no plans to repeat application of Acell. The patient expressed that herhusband could do the wound care as going to the Wound Ctr., would cost several $100 for each visit. 10/21/2016 -- her insurance company is getting her new mattress and she is pleased about that.Other than that she has been doing dressings with PolyMem Silver and has been doing very well 02/18/2017 -- she has gone through several changes of her mattress and has not been pleased withany of them. The ventricles are still working on trying to fit her with the appropriate low air-lossmattress. She has a new wound on the gluteal area which is clearly separated from the originalwound. 03/25/17-she is here in follow-up evaluation for her left ischialpressure ulcer. She remainsunsatisfied with her pressure mattress. She admits to sitting multiple hours a day, in the bed. [facility] discussed offloading options. The wound does not appear infected. Nutrition does not appear tobe a concern. Will follow-up in 4 weeks, if wound continues to be stalled may consider x-ray toevaluate for refractory osteomyelitis. 04/21/17; this is a patient that I don't know all that well. She has a chronic wound which at one pointhad underlying osteomyelitis in the left ischial tuberosity. This is a stage IV pressure ulcer. Over thelast 3 months she has a stage II wound inferiorly to the original wound. The last time she was hereher dressing was changed to silver collagen although the patient's husband  who changes thedressing said that the collagen stuck to the wound and remove skin from the superficial areatherefore he switched back to [place] 05/13/17; this is a patient we've been following for a left ischial tuberosity wound which was stage IVat one point had underlying osteomyelitis. Over the  last several months she's had a stage II woundjust inferior and medial to the related to the wound. According to her husband he is using Endoformlayer with collagen although this is not what I had last time. According to her husband they are usingEndoform Neomia Dear with collagen although I don't quite know how that started. [name], [id number] ([id number]) She was hospitalized from 1/20 through 04/30/16. This was related to a UTI. Her blood cultures werenegative, urine culture showed multiple species. She did have a CT scan of the abdomen and pelviswhich documented chronic osteomyelitis in the area of the wound inflammatory markers wereunremarkable. She has had prior knowledge of osteomyelitis. It looks as though she received IVantibiotics in 2017 and was treated with a course of hyperbaric oxygen. 05/28/17; the wound over the left ischial tuberosity is deeper today and abuts clearly on bone.Nursing intake reported drainage. I therefore culture of the wound. The more superficial area justbelow this looks about the same. They once again complained that there are mattress cover is notworking although apparently [facility] is been noted to see this many times in the reportis that the device is functional 06/18/17; the patient had a probing area on the left ischial tuberosity that was draining purulent fluidlast time. This also clearly seemed to have open bone. Culture I did showed pansensitivepseudomonas including third generation cephalosporins. I treated this with cefdinir 300 twice a dayfor 10 days and things seem to have improved. She has a more superficial wound just underneaththis area. Amazingly she has a new air mattress through [facility]. I think they gave this to her asa parking give. In any case this now works according to the patient may have something to do withwhy the areas are looking better. 07/09/17; the patient has a probing area in the left ischial tuberosity that still has some  depth.However this is contracted in terms of the wound orifice although the depth is still roughly the same.There is no undermining. -She also has the satellite wound which is more superficial. This appears to have a healthy surfacewe've been using silver collagen 08/06/17; the patient's wound is over the left ischial tuberosity and a satellite lesion just underneaththis. The original wound was actually a deep stage 4 wound. We have made good progress in 1month and there is no longer exposed bone here. 09/03/17; left ischial tuberosity actually appears to be quite healthy. I think we are making progress.No debridement is required. There is no surrounding erythema 10/01/17 I follow this patient monthly for her left ischial tuberosity wound. There is 2 areas theoriginal area and a satellite area. The satellite area looks a lot better there is no surroundingerythema. Her husband relates that he is having trouble maintaining the dressing. This has to do with the softtissue around it. He states he puts the collagen in but he cannot make sure that it stays in even withthe ABD pads and tape that he is been using 10/29/17; patient arrives with a better looking noon today. Some of the satellite lesions have closed.using Prisma 11/26/17; the patient has a large cone-shaped area with the tip of the Cone deep within her buttocksoft tissue. The walls of the Cone are epithelialized however the base is still  open. The area at thebase of this looks moist we've been using silver collagen. Will change to silver alginate 12/31/2017; the wound appears to have come in fairly nicely. Using silver alginate. There is no Schoenfeldt, Isella M. ([id number]) surrounding maceration or infection 01/28/18; there is still an open area here over the left initial tuberosity. Base of this however lookshealthy. There is no surrounding infection 02/25/18; the area of its open is over the left ischial tuberosity. The base of this is where the  woundis. This is a large inverted cone-shaped area with the wound at the tip. Dimensions of the wound atthe tip are improved. There is a area of denuded skin about halfway towards the tip which herhusband thinks may have happened today when he was bathing her. 04/20/17; the area is still open over the left initial tuberosity. This is an cone shaped wound with thetip where the wound remains area there is no evidence of infection, no erythema and no purulentdrainage 5/12; very fragile patient who had a chronic stage IV wound over the left ischial tuberosity. This isnow completely closed over although it is closed over with a divot and skin over bone at the base ofthis. Continued aggressive offloading will be necessary. 12/30/2018 READMISSION This is a 72 year old woman with chronic paraplegia. I picked her up for her care from Dr. Con Memos inthis clinic after he departed. She had a stage IV pressure wound over the left ischial tuberosity. Shewas treated twice for her underlying osteomyelitis and this I believe firstly in 2016 and again in2017. There were some plans at some point for flap closure of this however she was discovered tohave uncontrolled diabetes and I do not think this was ever accomplished. She ultimately healedover in this clinic and was discharged in May. She has a large cone-shaped indentation with the tip ofthis going towards the left ischial tuberosity. It is not an easy area to examine but at that time Ithought all of this was epithelialized. Apparently there was a reopening here shortly after she left the clinic last time. She was admitted tohospital at the end of June for Klebsiella bacteremia felt to be secondary to UTI. A CT scan of thepelvis is listed below and there was initially some concern that she had underlying osteomyelitisalthough I believe she was seen by infectious disease and that was felt to be not the case: I do notsee any new cultures or inflammatory markers IMPRESSION: 1. No  CT evidence for acute intra-abdominal or pelvic abnormality. Large volume of stool throughout the colon. 2. Enlarged fatty liver with fat sparing near the gallbladder fossa 3. Cortical scarring right kidney. Bilateral intrarenal stones without hydronephrosis. Thick-walled urinary bladder decompressed by suprapubic catheter 4. Deep left decubitus ulcer with underlying left ischial changes suggesting osteomyelitis. [name], [name]. ([id number]) Her husband has been using silver collagen in the wound. She has not been systemically unwell nofever chills eating and drinking well. They rigorously offload this wound only getting up in thewheelchair when she is going to appointments the rest of the time she is in bed. 10/8; wound measures larger and she now has exposed bone. We have been using silver alginate 11/12 still using silver alginate. The patient saw Dr. Megan Salon of infectious disease. She was startedon Augmentin 500 mg twice daily. She is due to follow-up with Dr. Megan Salon I believe next week. Labwork Dr. Megan Salon requested showed a sedimentation rate of 28 and CRP of 20 although her CRP 1year ago was 18.8. Sedimentation rate 1 year ago was  11 basic metabolic panel showed a creatinineof 1.12 12/3; the patient followed up with Dr. Megan Salon yesterday. She is still on Augmentin twice daily. Thiswas directed by Dr. Megan Salon. The patient's inflammatory markers have improved which is gratifying.Her C-reactive protein was repeated yesterday and follow-up booked with infectious disease inJanuary. In addition I have been getting secure text messages I think from palliative care through the [facility]. I think they were hoping to provide services to the patient in herhome. They could not get a hold of the primary physician and so they reached out to me on 2separate occasions. 12/17; patient last saw Dr. Megan Salon on 12/2. She is finishing up with Augmentin. Her C-reactiveprotein was 20  on 10/21, 10.1 on 11/19 and 17 on 12/2. The wound itself still has depth andundermining. We are using Santyl with the backing wet-to-dry 04/27/2019. The wound is gradually clearing up in terms of the surface although it is not filled in thatmuch. Still abuts right against bone 2/4; patient with a deep pressure ulcer over the left ischial tuberosity. I thought she was going tofollow-up with infectious disease to follow her inflammatory markers although the patient states thatthey stated that they did not need to see her unless we felt it was necessary. I will need to checktheir notes. In any case we ordered moistened silver collagen back with wet-to-dry to fill in the depthof the wound although apparently [facility] sent silver alginate which they have been using since theywere here the last time. Is obviously not what we ordered. 2/25. Not much change in this wound it is over the left ischial tuberosity recurrent wound. We havebeen using silver collagen with backing wet-to-dry. I think the wound is about the same. There is stillsome tunneling from about 10-12 o'clock over the ischial tuberosity itself 3/11; pressure ulcer over the left ischial tuberosity. Since she was last here the wound VAC wasstarted and apparently going quite well. We are able to get the home health company that acceptsUnited healthcare which is in itself sometimes problematic. There is been improvements in thewound the tunneling seems to be better and is contracted nicely 4/8; 1 month follow-up. Since she was last here we have been using silver collagen under a woundVAC. Some minor contraction I think in wound volume. She is cared for diligently by her husbandincluding pressure relief, incontinence management, nutritional support etc. 6/1; this is almost a 27-monthfollow-up. She is been using silver collagen under wound VAC. Circulararea over the left ischial tuberosity. She has been using silver collagen under wound VAC 7/8; 1 month  follow-up. Silver collagen under the VAC not really a lot of progress. Tissue at the base [name], [name] [place]([id number] of the wound which is right against bone and the tissue next that this does not look completelyviable. She is not currently on any antibiotics, she had underlying osteomyelitis I need to look thisover 8/16; we are using silver collagen under wound VAC to the left ischial tuberosity wound. Comes intoday with absolutely no change in surface area or depth. There is no exposed bone. I did look overher infectious disease notes as I said I would do last time. She [facility] She completed 6 weeks of Augmentin. This was in response to a bone culture I did showingmethicillin susceptible staph aureus and Enterococcus. She was supposed to come back to see KathrynCampbell at some point although they say that that appointment was canceled unless I chose torecommend return. I think there was supposed to  be follow-up with inflammatory markers but Icannot see that that was ever done. She has not been on antibiotics since 9/21; monthly follow-up. We received a call from home health nurse last evening to report greendrainage coming out of the wound. Lab work I ordered last time showed a white count of 5.2 asedimentation rate of 45 and a C-reactive protein of 25 however neither one of the 2 values aresubstantially different from her previous values in October 2020 or December 2020. Both are slightlyhigher but only marginally. Otherwise no new complaints from the patient or her husband 10/19; 1 month follow-up. PCR culture I did last time showed medium quantities of Pseudomonaslower quantities Klebsiella and Enterococcus faecalis group B strep and Peptostreptococcus. I gaveher Augmentin for 2 weeks. I am not really sure of my choice of this I would not coverPseudomonas. She is still having green drainage. Wound itself looks satisfactory there is not a lot ofdepth wound bed looks  healthy 11/16; patient has completed the antibiotics still using gentamicin and silver alginate on the wound.There is improvement in the surface area 12/21; in general the area on the buttock looks somewhat better. Surface looks healthy although Ido not know that there is been much improvement in the wound volume. We have been using silveralginate and Hydrofera Blue. Less drainage. In passing the husband showed me an abrasion injury on the left anterior tibia. Covered in necroticsurface. He has noticed this for about a week and has been putting silver collagen on it. He iscompletely uncertain about how this happened 1/25; monthly follow-up. The area on the left buttock is about the same. This does not go to bonebut a fairly deep wound surface of the wound is of questionable viability. The abrasion injury that they showed me last time apparently was closed out by home healthbecause they thought it was healed but certainly is not although it is just about healed. As a resultthey haven't been applying anything to this area Finally I did discuss with the patient and her husband the idea of an advanced treatment product totry and get a proper base to this wound I was thinking of Puraply however actually the patient pointsout that her co-pay for coming to visit [place] i.e. the facility, charge would be unaffordable if they haveto, on a weekly basis 2/22; pressure ulcer on the left buttock appears deeper to me and abuts on the ischial tuberosity. I [name], [name] M. ([id number]) thought initially there was exposed bone but there is a rim of tissue over this area. She also has asuperficial over the right anterior mid tibia. Been using silver collagen to these areas without muchsuccess. I have looked over the patient's past history with regards to the area on the left ischium. She didhave underlying osteomyelitis here dating back I think to late 2020. She saw Dr. Megan Salon shereceived a 6-week course of oral  antibiotics in response to a bone culture that I did. This does notappear to be infected but it certainly has not been improving in terms of granulation. I do not believeshe has had any recent imaging studies 3/29; 1 month follow-up. Pressure ulcer on the left buttock which she has been dealing with with fora number of years. She was treated for underlying osteomyelitis at 1.2 or 3 years ago I think withinfectious disease help. She also had I think a flap closure by [name] and that lasted for abouta year and then reopened. I have not been able to get this patient to progress towards healingalthough truthfully the  wound is absolutely no worse. We have been using Hydrofera Blue 4/26; patient presents for 1 week follow-up. She has been using silver collagen to the area everyother day. She has home health that comes out once a week to help with dressing changes as well.The patient is interested in trying a skin substitute over this area. She states she is trying to relievepressure off of it most of the day. 5/24; patient presents for 1 month follow-up. She has been using silver collagen to the area everyother day. She has no complaints today. She is interested in the skin substitute. She tries to leaverelieve pressure off Her bottom however is not able to most of the day 6/14; using silver collagen to the area over the left ischial tuberosity. Wound does not appear to bedoing particularly well. Open to bone 6/28; the patient patient presented last time with a marked deterioration. Depth probing all the wayto bone. The bone itself did not look particularly viable. In spite of this the x-ray I did showedlongstanding ulcer over the left ischial tuberosity with chronic bone involvement/reaction that wasalso seen by CT in 2020. Lab work did not show just active infection with a sed rate of 14 and a C-reactive protein of 7.1. Her comprehensive metabolic panel was normal including an albumin of 4.1white count was  6 7/19; patient was here 3 weeks ago with a marked deterioration in her wound over the left ischialtuberosity. I ordered a CT scan of the area for 1 reason or another this just did not get done. It isnow booked for 8 days from now. She was supposed to come back for bone culture and pathology.That did not happen either. We have been using silver collagen. As usual she is diligently looked afterby her husband 8/24; 5-week follow-up. Since the patient was last here the biopsy that I did of her ischial tuberositycame back suggesting osteomyelitis. Culture showed strep. I started her on Augmentin. She wasseen by infectious disease Dr. Lucianne Lei dam and the Augmentin was continued and she is still taking it.CT scan did not suggest anything other than chronic osteomyelitis without much change from herprevious study. Her C-reactive protein was only 7 and sedimentation rate was 1.1. Clopper, Koya M. ([id number]) We are still using silver collagen to the wound. She has home health. Her husband is very diligent inher care for this reason I have never pursued a diverting colostomy. She would not agree to thissurgery in any case. Finally we have discussed plastic surgery with him in the past and she is notinterested in a myocutaneous flap. 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 Since she was last here she was in the ER last week with abdominal pain. She was found to beimpacted however in the course of the review there somebody gave her some IV morphine andapparently she developed hives and blisters. There is still tense blisters on her plantar left fifth andfourth fingers 10/4; the patient has completed her Augmentin and is due to follow-up with Dr. Drucilla Schmidt in [place]. Her wound today measures about the same but looks a little healthier in terms ofgranulation there is no exposed bone. I note that she was admitted to hospital for 2 days from 9/21through 9/23 for delirium. She had received Versed for glaucoma surgery  ultimately that was felt tobe the etiology. Her blood cultures were negative she had 30,000 colonies of Pseudomonas in herurine. She did receive broad-spectrum antibiotic therapy but ultimately her wound on the buttockwas not felt to be the cause. As  mentioned she has completed her antibiotics still using silvercollagen 11/1; patient has completed her antibiotics for the underlying osteomyelitis. She apparently followsup with Dr. Drucilla Schmidt next week. The wound does not look too bad perhaps slightly narrower in termsof width but the depth is about the same. We have been using Hydrofera Blue. The patient talks tome about a wound VAC and made it sound as though she was recently on 1 although I do not seethis. The other option is an advanced treatment product like [facility]asis but that means weekly trips intothe clinic. She has previously said she does not want an attempt at plastic surgery 11/15; the patient saw Dr. Drucilla Schmidt noted that her follow-up inflammatory markers normalized. Shehas completed her antibiotics. We are currently using Hydrofera Blue. Wound itself has some surface tissue over bone but certainly not a lot. I have looked back over her history. The patient has had recurrent osteomyelitis in this area as she isreceived prolonged courses of antibiotics. We did use a wound VAC for a prolonged period of time in2021. We had some improvement especially in undermining areas but overall not a lot of measurableimprovement. Once again I have tried to think about using advanced treatment products in this areasomething that would require them visiting the clinic very frequently and they did not seem to wantto do this. Most recently we ran [place] however she would have a [id number] per application co-pay [id number]; left ischial tuberosity. I do not see much difference in 3 weeks. There is no exposed bone. Weare using Hydrofera Blue. Our intake nurse reports some "greenish" drainage [name], [name]. ([id number]) 12/21 left ischial  tuberosity. Measuring slightly smaller in surface area. The wound still has sometissue over bone i.e. there is no exposed bone. No overt infection. We did a deep tissue culture last time for PCR. The major bacteria was Pseudomonas although therewere medium titers of Enterococcus faecalis Klebsiella E. [name] and Morganella as well asPeptostreptococcus. Keystone antibiotic include streptomycin and vancomycin they got this lastweekend and used it twice 04/17/2021; no change in this wound. It does not have undermining however the surface is of it doesnot look particularly viable. We have been using topical antibiotic directed at a PCR culture as well assilver alginate. It is clear in talking to the patient and her husband that she does not offload this area adequatelyincluding spending all night on this with I think a level 2 surface on her bed. I have told her that thisis not adequate to heal a wound like this.. 2/15; [facility] with a 0000000 co-pay per application. They are using silver alginate to the woundoffloading as best they can according to her husband 06/19/2021: At her last visit, Dr. Dellia Nims changed her to silver collagen, but apparently they did notreceive this until just last week. They have continued using silver alginate in the wound. Today, thewound is quite malodorous with necrotic slough. They ran out of Keystone topical about 2 monthsago. No new cultures have been taken.. 07/17/2021: At the last visit, the wound was in pretty poor condition with a lot of necrotic slough andsubstantial malodorous drainage. I did take a new culture for PCR, but for some reason this wasnever resulted. Nonetheless, they did receive a renewal of their [employer] apparently it wascommunicated to them that they should use silver alginate rather than the silver collagen.Regardless, the wound is much cleaner at this visit and there is no odor. 07/26/2021: The sacral/left ischial ulcer is fairly clean, but there was some greenish  drainageappreciated on the dressing. They  have been using topical Keystone with silver alginate. She hasunfortunately developed a new ulcer on her right ischium. It is unstageable with a thick layer ofeschar. It is malodorous. 08/14/2021: I took a new PCR culture at her last visit from the new wound that had opened on herright ischial tuberosity. This returned with a polymicrobial population. A new topical Keystonecompound was formulated. They have been using this on her wounds along with silver alginate. Ialso prescribed a course of oral antibiotic, Augmentin and Levaquin to try and address the multiplespecies with various resistance these that grew out. She was able to take this for about 10 days, butit has started to make her nauseated and she is actually thrown up on occasion. Since she takesthem at the same time, she is not sure which one is causing her issue. She has also been having a Scarber, Yvonnie M. ([id number]) lot more drainage from her left ischial wound. Her husband says that he has not been using the zincoxide as much because it is difficult to wash off when he bathes her. We have been using Santyl tothe new wound under silver alginate and silver alginate to the left ischial wound. 09/11/2021: Unfortunately, there has been fairly substantial breakdown of both of her wounds. Itsounds like the gel compound that the [employer] was being mixed in just leaked out everywhere andleft the patient wet and macerated. There is worsening necrotic tissue and increased depth on theright ischial wound and the left one has extended further. They have not been using the zinc oxide asrecommended. They contacted [facility] and were advised to simply apply the powder directly to thewounds. 10/09/2021: The right ischial wound has some necrotic tissue on the lateral aspect and into the base,but no purulent drainage and no surrounding erythema or induration. On the left ischial wound,there is leathery nonviable skin extending  laterally from the main wound. No significant odor ordrainage from this site, either. 11/05/2021: The right ischial wound has more necrotic fibrinous tissue at the base. The leatheryeschar on the left ischial wound is beginning to lift at one of the edges and may be amenable todebridement today. We have been using topical powdered mupirocin to her wounds with silveralginate. 12/03/2021: Significant improvement in both of the wounds. There is still some fibrinous slough onthe right ischial wound. There is also a heavy layer of slough and fibrinous exudate on the left ischialwound as well as some soft light slough on the remainder of the wound surface. We have been usingSantyl for enzymatic debridement between clinic visits. 12/31/2021: The right ischial wound is cleaner today with very minimal slough. The left ischial woundhas an area of devitalized subcutaneous tissue and the most lateral extension of the wound. There isslough and biofilm on the surface, but overall the wounds are looking significantly better. We havebeen unable to obtain Santyl and they have just been doing wet-to-dry dressing changes with goodeffect. 01/30/2022: The left ischial wound is very clean with just a little bit of slough on the posterolateralaspect of the wound. She has some pressure induced deep tissue injury on the right ischial wound,but otherwise both are quite clean. 03/06/2022: The low-air-loss mattress was finally delivered last week. Even over the short period oftime she has been using it, her husband thinks that the wounds have demonstrated an improvementin impairments. Certainly, since our last visit, they look better. All of the surfaces are robust withgood granulation tissue. There is no further pressure-induced tissue damage visible. There is someslough accumulation on all of the wound surfaces. Hartlage,  [name] ([id number]) Patient History Information obtained from Patient. Family History Unknown History, Cancer -  Mother, Diabetes - Maternal Grandparents, Heart Disease - Mother,Hypertension - Mother, Thyroid Problems - Mother, No family history of Hereditary Spherocytosis, Kidney Disease, Lung Disease, Seizures, Stroke,Tuberculosis. Social History Former smoker - ended on 11/06/2014, Marital Status - Married, Alcohol Use - Never, Drug Use - NoHistory, Caffeine Use - Daily - Tea, soda. Medical History Eyes Denies history of Cataracts, Glaucoma, Optic Neuritis Ear/Nose/Mouth/Throat Denies history of Chronic sinus problems/congestion, Middle ear problems Hematologic/Lymphatic Patient has history of Anemia Denies history of Hemophilia, Human Immunodeficiency Virus, Lymphedema, Sickle Cell Disease Respiratory Denies history of Aspiration, Asthma, Chronic Obstructive Pulmonary Disease (COPD),Pneumothorax, Sleep Apnea, Tuberculosis Cardiovascular Patient has history of Hypertension Denies history of Angina, Arrhythmia, Congestive Heart Failure, Coronary Artery Disease, Deep VeinThrombosis, Hypotension, Myocardial Infarction, Peripheral Arterial Disease, Peripheral VenousDisease, Phlebitis, Vasculitis Gastrointestinal Denies history of Cirrhosis , Colitis, Crohn's, Hepatitis A, Hepatitis B, Hepatitis C Endocrine Patient has history of Type II Diabetes Denies history of Type I Diabetes Genitourinary Denies history of End Stage Renal Disease Immunological Denies history of Lupus Erythematosus, Raynaud's, Scleroderma Integumentary (Skin) Denies history of History of Burn Musculoskeletal [name], [name] ([id number]) Patient has history of Osteoarthritis Denies history of Gout, Rheumatoid Arthritis, Osteomyelitis Neurologic Patient has history of Dementia, Paraplegia Denies history of Neuropathy, Quadriplegia Oncologic Patient has history of Received Radiation - 2012 - right breast Psychiatric Denies history of Anorexia/bulimia, Confinement Anxiety Hospitalization/Surgery History - left knee  replacement. - right breast lumpectomy. - cervicallaminectomy with fusion. - hysterectomy. - tear duct surgery. - T & A. - UTI. - suprapubic cathplacement. - UTI. Medical And Surgical History Notes Gastrointestinal constipation GERD Genitourinary UTI kidney stones neurogenic bladder Integumentary (Skin) left gluteal fold sacral Musculoskeletal cervical neuropathy osteomyelitis Psychiatric admitted for suicide risk on 12/02/2018 Objective Constitutional Slightly tachycardic, asymptomatic.Marland Kitchen No acute distress. Vitals Time Taken: 10:43 AM, Height: 63 in, Weight: 185 lbs, BMI: 32.8, Temperature: 97.5 F, [name], [name] M. ([id number]) Pulse: 102 bpm, Respiratory Rate: 16 breaths/min, Blood Pressure: 127/72 mmHg. Respiratory Normal work of breathing on room air. General Notes: 03/06/2022: All of the surfaces are robust with good granulation tissue. There is nofurther pressure-induced tissue damage visible. There is some slough accumulation on all of thewound surfaces. Integumentary (Hair, Skin) Wound #12 status is Open. Original cause of wound was Pressure Injury. The date acquired was:09/06/2018. The wound has been in treatment 166 weeks. The wound is located on the Left IschialTuberosity. The wound measures 4.1cm length x 7cm width x 2.7cm depth; 22.541cm^2 area and60.861cm^3 volume. There is Fat Layer (Subcutaneous Tissue) exposed. There is no tunneling orundermining noted. There is a medium amount of serosanguineous drainage noted. The woundmargin is well defined and not attached to the wound base. There is large (67-100%) red, pinkgranulation within the wound bed. There is a small (1-33%) amount of necrotic tissue within thewound bed including Adherent Slough. The periwound skin appearance exhibited: Scarring,Dry/Scaly. The periwound skin appearance did not exhibit: Callus, Crepitus, Excoriation, Induration,Rash, Maceration, Atrophie Blanche, Cyanosis, Ecchymosis, Hemosiderin Staining, Mottled,  Pallor,Rubor, Erythema. Periwound temperature was noted as No Abnormality. The periwound hastenderness on palpation. Wound #15 status is Open. Original cause of wound was Pressure Injury. The date acquired was:07/21/2021. The wound has been in treatment 31 weeks. The wound is located on the Right Ischium.The wound measures 2.2cm length x 2.1cm width x 2.6cm depth; 3.629cm^2 area and 9.434cm^3volume. There is Fat Layer (Subcutaneous Tissue) exposed. There is no tunneling or underminingnoted. There is  a medium amount of serosanguineous drainage noted. The wound margin is distinctwith the outline attached to the wound base. There is large (67-100%) red granulation within thewound bed. There is a small (1-33%) amount of necrotic tissue within the wound bed includingAdherent Slough. The periwound skin appearance exhibited: Scarring, Maceration. The periwoundskin appearance did not exhibit: Callus, Crepitus, Excoriation, Induration, Rash, Dry/Scaly, AtrophieBlanche, Cyanosis, Ecchymosis, Hemosiderin Staining, Mottled, Pallor, Rubor, Erythema. Periwoundtemperature was noted as No Abnormality. Assessment [place]ala, [place] ([id number]) Active Problems ICD-10 Pressure ulcer of left buttock, stage 4 Type 2 diabetes mellitus with other skin ulcer Pressure ulcer of right buttock, stage 3 Procedures Wound #12 Pre-procedure diagnosis of Wound #12 is a Pressure Ulcer located on the Left Ischial Tuberosity .There was a Selective/Open Wound Non-Viable Tissue Debridement with a total area of 12.3 sq cmperformed by [name] Maudlin, MD. With the following instrument(s): Curette to remove Non-Viabletissue/material. Material removed includes [facility] after achieving pain control using Lidocaine 4%Topical Solution. No specimens were taken. A time out was conducted at 11:15, prior to the start ofthe procedure. A Minimum amount of bleeding was controlled with Pressure. The procedure wastolerated well. Post Debridement Measurements:  4.1cm length x 7cm width x 2.7cm depth;60.861cm^3 volume. Post debridement Stage noted as Category/Stage IV. Character of Wound/Ulcer Post Debridement is improved. Post procedure Diagnosis Wound #12: Same as Pre-Procedure General Notes: scribed for Dr. Celine Ahr by Adline Peals, RN. Wound #15 Pre-procedure diagnosis of Wound #15 is a Pressure Ulcer located on the Right Ischium . There wasa Selective/Open Wound Non-Viable Tissue Debridement with a total area of 4.62 sq cm performedby [name] Maudlin, MD. With the following instrument(s): Curette to remove Non-Viabletissue/material. Material removed includes [facility] after achieving pain control using Lidocaine 4%Topical Solution. No specimens were taken. A time out was conducted at 11:15, prior to the start ofthe procedure. A Minimum amount of bleeding was controlled with Pressure. The procedure wastolerated well. Post Debridement Measurements: 2.2cm length x 2.1cm width x 2.6cm depth;9.434cm^3 volume. Post debridement Stage noted as Category/Stage III. Character of Wound/Ulcer Post Debridement is improved. Post procedure Diagnosis Wound #15: Same as Pre-Procedure General Notes: scribed for Dr. Celine Ahr by Adline Peals, RN. [place], [place]. ([id number]) Plan Follow-up Appointments: Return appointment in 1 month. - Dr. Celine Ahr - Room 1 ****HOYER**** Anesthetic: Wound #12 Left Ischial Tuberosity: (In clinic) Topical Lidocaine 4% applied to wound bed Bathing/ Shower/ Hygiene: May shower and wash wound with soap and water. - with dressing changes Edema Control - Lymphedema / SCD / Other: Elevate legs to the level of the heart or above for 30 minutes daily and/or when sitting, a frequencyof: - throughout the day Off-Loading: Air fluidized (Group 3) mattress - Medical Modalities Turn and reposition every 2 hours - *** Try to shift from side to side and shift to belly (iftolerated),****avoid sitting up in bed except for meals Additional Orders /  Instructions: Follow Nutritious Diet - protein shakes 2-3 times per day, Juven 2 times per day Home Health: New wound care orders this week; continue Home Health for wound care. May utilize formularyequivalent dressing for wound treatment orders unless otherwise specified. - Mix Santyl andGentamicin together and apply to wound beds. Moisten gauze with Dakins solution and apply towound beds. Cover with zetuvit borders. Dressing changes to be completed by [place] on Monday / Wednesday / Friday except whenpatient has scheduled visit at [facility]. - increase visits to 3 times per week (spousehaving difficulty with dressing changes) Other Home Health Orders/Instructions: Latricia Heft [facility] The following  medication(s) was prescribed: lidocaine topical 4 % cream cream topical wasprescribed at facility WOUND #12 : - Ischial Tuberosity Wound Laterality: Left Cleanser : Soap and Water 1 x Per Day/30 Days Discharge Instructions: May shower and wash wound with dial antibacterial soap andwater prior to dressing change. Cleanser : Wound Cleanser 1 x Per Day/30 Days Discharge Instructions: Cleanse the wound with wound cleanser prior to applying a cleandressing using gauze sponges, not tissue or cotton balls. Peri-Wound Care : Skin Prep (Generic) 1 x Per Day/30 Days [name], [name] ([id number]) Discharge Instructions: Use skin prep as directed Topical : Gentamicin 1 x Per Day/30 Days Discharge Instructions: As directed by physician Primary Dressing : Dakin's Solution 0.25%, 16 (oz) 1 x Per Day/30 Days Discharge Instructions: Moisten gauze with Dakin's solution Primary Dressing : Santyl Ointment 1 x Per Day/30 Days Discharge Instructions: Apply nickel thick amount to wound bed as instructed Primary Dressing : Medline Woven Gauze Sponges 4x4 (in/in) 1 x Per Day/30 Days Discharge Instructions: moisten with saline and pack lightly into wound Secondary Dressing : Zetuvit Plus Silicone Border  Dressing 5x5 (in/in) (Generic) 1 x Per Day/30Days Discharge Instructions: Apply silicone border or ABD padover primary dressing asdirected. WOUND #15 : - Ischium Wound Laterality: Right Cleanser : Soap and Water 1 x Per Day/30 Days Discharge Instructions: May shower and wash wound with dial antibacterial soap andwater prior to dressing change. Cleanser : Wound Cleanser 1 x Per Day/30 Days Discharge Instructions: Cleanse the wound with wound cleanser prior to applying a cleandressing using gauze sponges, not tissue or cotton balls. Peri-Wound Care : Skin Prep (Generic) 1 x Per Day/30 Days Discharge Instructions: Use skin prep as directed Topical : Gentamicin 1 x Per Day/30 Days Discharge Instructions: As directed by physician Primary Dressing : Dakin's Solution 0.25%, 16 (oz) 1 x Per Day/30 Days Discharge Instructions: Moisten gauze with Dakin's solution Primary Dressing : Santyl Ointment 1 x Per Day/30 Days Discharge Instructions: Apply nickel thick amount to wound bed as instructed Primary Dressing : Medline Woven Gauze Sponges 4x4 (in/in) 1 x Per Day/30 Days Discharge Instructions: moisten with saline and pack lightly into wound Secondary Dressing : Zetuvit Plus Silicone Border Dressing 5x5 (in/in) (Generic) 1 x Per Day/30Days Discharge Instructions: Apply silicone border or ABD padover primary dressing asdirected. 03/06/2022: All of the surfaces are robust with good granulation tissue. There is no furtherpressure-induced tissue damage visible. There is some slough accumulation on all of the woundsurfaces. Lees, Ilya M. ([id number]) I used a curette to debride slough from all of her wounds. We will continue to use the combinationof topical gentamicin mixed with Santyl backed with Dakin's-moistened gauze packing the wound. Ithink the low-air-loss mattress has been a major step forward in her care. Follow-up in 1 month. Electronic Signature(s) Signed: 03/06/2022 12:07:29 PM By:  [name] Maudlin MD FACS Entered By: [name] on 03/06/2022 12:07:29 Sick, [name] ([id number]) Physician Orders Details Patient Name: [name]. Medical Record Number: [id number] Date of Birth/Sex: 1951/01/25 (72 y.o. F) Primary Care Provider: Sandi [name] Referring Provider: Antonieta [name] in Treatment: 166 Date of Service: 03/06/2022 10:30 AM Patient Account Number: [id number] Treating RN: Adline Peals Other Clinician: Treating Provider/Extender: [name] Verbal / Phone Orders: No Diagnosis Coding ICD-10 Coding Code Description [phone] Pressure ulcer of left buttock, stage 4 E11.622 Type 2 diabetes mellitus with other skin ulcer L89.313 Pressure ulcer of right buttock, stage 3 Follow-up Appointments Return appointment in 1 month. - Dr. Celine Ahr - Room 1 ****[facility]**** Anesthetic Wound #  12 Left Ischial Tuberosity (In clinic) Topical Lidocaine 4% applied to wound bed Bathing/ Shower/ Hygiene May shower and wash wound with soap and water. - with dressing changes Edema Control - Lymphedema / SCD / Other Elevate legs to the level of the heart or above for 30 minutes daily and/or whensitting, a frequency of: - throughout the day Off-Loading Schader, Dayne M. ([id number]) Air fluidized (Group 3) mattress - Medical Modalities Turn and reposition every 2 hours - *** Try to shift from side to side and shift to belly (iftolerated),****avoid sitting up in bed except for meals Additional Orders / Instructions Follow Nutritious Diet - protein shakes 2-3 times per day, Juven 2 times per day [place] wound care orders this week; continue Home Health for wound care. May utilizeformulary equivalent dressing for wound treatment orders unless otherwisespecified. - Mix Santyl and Gentamicin together and apply to wound beds. Moisten gauze withDakins solution and apply to wound beds. Cover with zetuvit borders. Dressing changes to be  completed by Ida on Monday / Wednesday / Fridayexcept when patient has scheduled visit at [facility]. - increase visits to 3times per week (spouse having difficulty with dressing changes) Other Home Health Orders/Instructions: - [facility] Wound Treatment Wound #12 - Ischial Tuberosity Wound Laterality: Left Cleanser : Soap and Water 1 x Per Day/30 Days Discharge Instructions: May shower and wash wound with dial antibacterial soap andwater prior to dressing change. Cleanser : Wound Cleanser 1 x Per Day/30 Days Discharge Instructions: Cleanse the wound with wound cleanser prior to applying a cleandressing using gauze sponges, not tissue or cotton balls. Peri-Wound Care : Skin Prep (Generic) 1 x Per Day/30 Days Discharge Instructions: Use skin prep as directed Topical : Gentamicin 1 x Per Day/30 Days Discharge Instructions: As directed by physician Primary Dressing : Dakin's Solution 0.25%, 16 (oz) 1 x Per Day/30 Days Discharge Instructions: Moisten gauze with Dakin's solution Hufstedler, [name] ([id number]) Primary Dressing : Santyl Ointment 1 x Per Day/30 Days Discharge Instructions: Apply nickel thick amount to wound bed as instructed Primary Dressing : Medline Woven Gauze Sponges 4x4 (in/in) 1 x Per Day/30 Days Discharge Instructions: moisten with saline and pack lightly into wound Secondary Dressing : Zetuvit Plus Silicone Border Dressing 5x5 (in/in) (Generic) 1 x Per Day/30 Days Discharge Instructions: Apply silicone border or ABD padover primary dressingas directed. Wound #15 - Ischium Wound Laterality: Right Cleanser : Soap and Water 1 x Per Day/30 Days Discharge Instructions: May shower and wash wound with dial antibacterial soap andwater prior to dressing change. Cleanser : Wound Cleanser 1 x Per Day/30 Days Discharge Instructions: Cleanse the wound with wound cleanser prior to applying a cleandressing using gauze sponges, not  tissue or cotton balls. Peri-Wound Care : Skin Prep (Generic) 1 x Per Day/30 Days Discharge Instructions: Use skin prep as directed Topical : Gentamicin 1 x Per Day/30 Days Discharge Instructions: As directed by physician Primary Dressing : Dakin's Solution 0.25%, 16 (oz) 1 x Per Day/30 Days Discharge Instructions: Moisten gauze with Dakin's solution Primary Dressing : Santyl Ointment 1 x Per Day/30 Days Discharge Instructions: Apply nickel thick amount to wound bed as instructed Primary Dressing : Medline Woven Gauze Sponges 4x4 (in/in) 1 x Per Day/30 Days Discharge Instructions: moisten with saline and pack lightly into wound Ishmael, Nikitha M. ([id number]) Secondary Dressing : Zetuvit Plus Silicone Border Dressing 5x5 (in/in) (Generic) 1 x Per Day/30 Days Discharge Instructions: Apply silicone border or ABD padover primary dressingas directed. Patient Medications Allergies:  Iodinated Contrast Media - IV Dye, aspirin, Sulfa (Sulfonamide Antibiotics) Notifications Medication Indication Start End lidocaine 03/06/2022 DOSE topical 4 % cream - cream topical Electronic Signature(s) Signed: 03/06/2022 12:45:53 PM By: [name] Maudlin MD FACS Entered By: [name] on 03/06/2022 12:04:33 Hulon, [name] ([id number]) Debridement Details Patient Name: [name]. Medical Record Number: [id number] Date of Birth/Sex: [birth year] (72 y.o. F) Primary Care Provider: Sandi [name] Referring Provider: Antonieta [name] in Treatment: 166 Date of Service: 03/06/2022 10:30 AM Patient Account Number: [id number] Treating RN: Adline Peals Other Clinician: Treating Provider/Extender: [name] Debridement Performedfor Assessment: Wound #15 Right Ischium Performed By: Physician [name] Maudlin, MD Debridement Type: Debridement Level of Consciousness(Pre-procedure): Awake and Alert Pre-procedureVerification/Time OutTaken: Yes -  11:15 Start Time: 11:15 Pain Control: Lidocaine 4% Topical Solution Total Area Debrided (L xW): 2.2 (cm) x 2.1 (cm) = 4.62 (cm) Tissue and othermaterial debrided: Non-Viable, Slough, Slough Level: Non-Viable Tissue DebridementDescription: Selective/Open Wound Instrument: Curette Bleeding: Minimum HemostasisAchieved: Pressure Response toTreatment: Procedure was tolerated well Level ofConsciousness (Post-procedure): Awake and Alert Post Debridement Measurements of Total Wound Length: (cm) 2.2 Width: (cm) 2.1 Stage: Category/StageIII Vernon, Chyler M. ([id number]) Depth: (cm) 2.6 Volume: (cm) 9.434 Character of Wound/Ulcer PostDebridement: Improved Post Procedure Diagnosis Same as Pre-procedure Notes scribed for Dr. Celine Ahr by Adline Peals, RN Electronic Signature(s) Signed: 03/06/2022 12:45:53 PM By: [name] Maudlin MD FACS Signed: 03/06/2022 5:21:53 PM By: Adline Peals Entered By: Adline Peals on 03/06/2022 11:15:28 Lamaster, [name] ([id number]) Debridement Details Patient Name: [name]. Medical Record Number: [id number] Date of Birth/Sex: 1950/10/01 (72 y.o. F) Primary Care Provider: Sandi [name] Referring Provider: Antonieta [name] in Treatment: 166 Date of Service: 03/06/2022 10:30 AM Patient Account Number: [id number] Treating RN: Adline Peals Other Clinician: Treating Provider/Extender: [name] Debridement Performedfor Assessment: Wound #12 Left Ischial Tuberosity Performed By: Physician [name] Maudlin, MD Debridement Type: Debridement Level of Consciousness(Pre-procedure): Awake and Alert Pre-procedureVerification/Time OutTaken: Yes - 11:15 Start Time: 11:15 Pain Control: Lidocaine 4% Topical Solution Total Area Debrided (L xW): 4.1 (cm) x 3 (cm) = 12.3 (cm) Tissue and othermaterial debrided: Non-Viable, Slough, Slough Level: Non-Viable  Tissue DebridementDescription: Selective/Open Wound Instrument: Curette Bleeding: Minimum HemostasisAchieved: Pressure Response toTreatment: Procedure was tolerated well Level ofConsciousness (Post-procedure): Awake and Alert Post Debridement Measurements of Total Wound Length: (cm) 4.1 Width: (cm) 7 Stage: Category/StageIV Terrones, Natahlia M. ([id number]) Depth: (cm) 2.7 Volume: (cm) 60.861 Character of Wound/Ulcer PostDebridement: Improved Post Procedure Diagnosis Same as Pre-procedure Notes scribed for Dr. Celine Ahr by Adline Peals, RN Electronic Signature(s) Signed: 03/06/2022 12:45:53 PM By: [name] Maudlin MD FACS Signed: 03/06/2022 5:21:53 PM By: Adline Peals Entered By: Adline Peals on 03/06/2022 11:15:53 Wilbon, [name] ([id number]) Advanced Modalities Screening Tool Details Patient Name: [name]. Medical Record Number: [id number] Date of Birth/Sex: June 18, 1950 (72 y.o. F) Primary Care Provider: Sandi [name] Referring Provider: Antonieta [name] in Treatment: 166 Date of Service: 03/06/2022 10:30 AM Patient Account Number: [id number] Treating RN: Adline Peals Other Clinician: Treating Provider/Extender: [name] Advanced Modalities Screening Check List Cellular or Tissue Based Products: Reviewed [employer] Reimbursement Manual and/or LCD forspecific requirements covering product use and reimbursement. Therapy Not Indicated Total Contact Cast (TCC): Reviewed [employer] Reimbursement Manual and/or LCD for specificrequirements covering product use and reimbursement. Therapy Not Indicated Hyperbaric Oxygen Therapy: Reviewed LCD or HBO policy from [employer] and/or NCD Pending Decision Further Diagnostic Testing Electronic Signature(s) Signed: 03/06/2022 5:21:53 PM By: Adline Peals Entered By: Adline Peals on 03/06/2022 13:02:16 Usrey, [name] ([id number]) HxROS Details Patient Name: Tieu,  Zoei  M. Medical Record Number: [id number] Date of Birth/Sex: November 09, 1950 (72 y.o. F) Primary Care Provider: Sandi [name] Referring Provider: Antonieta [name] in Treatment: 166 Date of Service: 03/06/2022 10:30 AM Patient Account Number: [id number] Treating RN: [name] Other Clinician: Treating Provider/Extender: [name] Information Obtained From Patient Eyes Medical History: Negative for: Cataracts; Glaucoma; Optic Neuritis Ear/Nose/Mouth/Throat Medical History: Negative for: Chronic sinus problems/congestion; Middle ear problems Hematologic/Lymphatic Medical History: Positive for: Anemia Negative for: Hemophilia; Human Immunodeficiency Virus; Lymphedema; Sickle Cell Disease Respiratory Medical History: Negative for: Aspiration; Asthma; Chronic Obstructive Pulmonary Disease (COPD); Pneumothorax;Sleep Apnea; Tuberculosis Cardiovascular Medical History: Positive for: Hypertension Negative for: Angina; Arrhythmia; Congestive Heart Failure; Coronary Artery Disease; Deep VeinThrombosis; Hypotension; Myocardial Infarction; Peripheral Arterial Disease; Peripheral VenousDisease; Phlebitis; Vasculitis Gastrointestinal Fambrough, Chisholm ([id number]) Medical History: Negative for: Cirrhosis ; Colitis; Crohn's; Hepatitis A; Hepatitis B; Hepatitis C Past Medical History Notes: constipation GERD Endocrine Medical History: Positive for: Type II Diabetes Negative for: Type I Diabetes Time with diabetes: 1 year Treated with: Oral agents Blood sugar tested every day: No Genitourinary Medical History: Negative for: End Stage Renal Disease Past Medical History Notes: UTI kidney stones neurogenic bladder Immunological Medical History: Negative for: Lupus Erythematosus; Raynaud's; Scleroderma Integumentary (Skin) Medical History: Negative for: History of Burn Past Medical History Notes: left gluteal fold sacral Musculoskeletal Medical History: Positive  for: Osteoarthritis Negative for: Gout; Rheumatoid Arthritis; Osteomyelitis Past Medical History Notes: cervical neuropathy Laiche, Brucha M. ([id number]) osteomyelitis Neurologic Medical History: Positive for: Dementia; Paraplegia Negative for: Neuropathy; Quadriplegia Oncologic Medical History: Positive for: Received Radiation - 2012 - right breast Psychiatric Medical History: Negative for: Anorexia/bulimia; Confinement Anxiety Past Medical History Notes: admitted for suicide risk on 12/02/2018 Immunizations Pneumococcal Vaccine: Received Pneumococcal Vaccination: Yes Received Pneumococcal Vaccination On or After 60th Birthday: No Implantable Devices None Hospitalization / Surgery History Type of Hospitalization/Surgery left knee replacement right breast lumpectomy cervical laminectomy with fusion hysterectomy tear duct surgery T & A UTI suprapubic cath placement UTI Family and Social History Unknown History: Yes; Cancer: Yes - Mother; Diabetes: Yes - Maternal Grandparents; Heart Disease:Yes - Mother; Hereditary Spherocytosis: No; Hypertension: Yes - Mother; Kidney Disease: No; Lung Smithey, Ephrata M. ([id number]) Disease: No; Seizures: No; Stroke: No; Thyroid Problems: Yes - Mother; Tuberculosis: No; Formersmoker - ended on 11/06/2014; Marital Status - Married; Alcohol Use: Never; Drug Use: No History;Caffeine Use: Daily - Tea, soda; Financial Concerns: No; Food, Clothing or Shelter Needs: No; SupportSystem Lacking: No; Transportation Concerns: No Electronic Signature(s) Signed: 03/06/2022 12:45:53 PM By: [name] Maudlin MD FACS Entered By: [name] on 03/06/2022 12:01:37 Formosa, [name] ([id number]) Lower Extremity Assessment Details Patient Name: [name]. Medical Record Number: [id number] Date of Birth/Sex: Jun 10, 1950 (72 y.o. F) Primary Care Provider: Sandi [name] Referring Provider: Antonieta [name] in Treatment: 166 Date of  Service: 03/06/2022 10:30 AM Patient Account Number: [id number] Treating RN: Adline Peals Other Clinician: Treating Provider/Extender: [name] Electronic Signature(s) Signed: 03/06/2022 5:21:53 PM By: Adline Peals Entered By: Adline Peals on 03/06/2022 10:50:06 Perlmutter, [name] ([id number]) Multi Wound Chart Details Patient Name: [name]. Medical Record Number: [id number] Date of Birth/Sex: [birth year] (72 y.o. F) Primary Care Provider: Sandi [name] Referring Provider: Antonieta [name] in Treatment: 166 Date of Service: 03/06/2022 10:30 AM Patient Account Number: [id number] Treating RN: [name] Other Clinician: Treating Provider/Extender: [name] Vital Signs Height(in): 63 Weight(lbs): 185 Body MassIndex(BMI): 32.8 Temperature(F): 97.5 RespiratoryRate(breaths/min): 16 Pulse(bpm): 102 BloodPressure(mmHg): 127/72 Wound Assessments Wound Number: 12 15 N/A Photos: N/A Wound  Location: Left Ischial Tuberosity Right Ischium N/A Wounding Event: Pressure Injury Pressure Injury N/A PrimaryEtiology: Pressure Ulcer Pressure Ulcer N/A ComorbidHistory: Anemia, Hypertension,Type II Diabetes,Osteoarthritis,Dementia, Paraplegia,Received Radiation Anemia, Hypertension,Type II Diabetes,Osteoarthritis,Dementia, Paraplegia,Received Radiation N/A Date Acquired: 09/06/2018 07/21/2021 N/A Weeks ofTreatment: 166 31 N/A Wound Status: Open Open N/A Yahnke, Zandrea Jerilynn Mages ([id number]) WoundRecurrence: No No N/A Measurements Lx W x D (cm) 4.1x7x2.7 2.2x2.1x2.6 N/A Area (cm) : 22.541 3.629 N/A Volume (cm) : 60.861 9.434 N/A % Reduction inArea: -9451.30% 78.20% N/A % Reduction inVolume: -28608.00% -468.00% N/A Classification: Category/Stage IV Category/Stage III N/A ExudateAmount: Medium Medium N/A Exudate Type: Serosanguineous Serosanguineous N/A Exudate Color: red, brown red,  brown N/A Wound Margin: Well defined, notattached Distinct, outline attached N/A GranulationAmount: Large (67-100%) Large (67-100%) N/A GranulationQuality: Red, Pink Red N/A NecroticAmount: Small (1-33%) Small (1-33%) N/A ExposedStructures: Fat Layer (SubcutaneousTissue): Yes Fascia: No Tendon: No Muscle: No Joint: No Bone: No Fat Layer (SubcutaneousTissue): Yes Fascia: No Tendon: No Muscle: No Joint: No Bone: No N/A Epithelialization: Small (1-33%) None N/A Debridement: Debridement -Selective/Open Wound Debridement -Selective/Open Wound N/A Pre-procedureVerification/TimeOut Taken: 11:15 11:15 N/A Pain Control: Lidocaine 4% TopicalSolution Lidocaine 4% TopicalSolution N/A Tissue Debrided: [facility] N/A Level: Non-Viable Tissue Non-Viable Tissue N/A Puryear, Kierrah M. ([id number]) DebridementArea (sq cm): 12.3 4.62 N/A Instrument: Curette Curette N/A Bleeding: Minimum Minimum N/A HemostasisAchieved: Pressure Pressure N/A DebridementTreatmentResponse: Procedure was toleratedwell Procedure was toleratedwell N/A PostDebridementMeasurements Lx W x D (cm) 4.1x7x2.7 2.2x2.1x2.6 N/A PostDebridementVolume: (cm) 60.861 9.434 N/A PostDebridementStage: Category/Stage IV Category/Stage III N/A Periwound SkinTexture: Scarring: Yes Excoriation: No Induration: No Callus: No Crepitus: No Rash: No Scarring: Yes Excoriation: No Induration: No Callus: No Crepitus: No Rash: No N/A Periwound SkinMoisture: Dry/Scaly: Yes Maceration: No Maceration: Yes Dry/Scaly: No N/A Periwound SkinColor: Atrophie Blanche: No Cyanosis: No Ecchymosis: No Erythema: No Hemosiderin Staining:No Mottled: No Pallor: No Rubor: No Atrophie Blanche: No Cyanosis: No Ecchymosis: No Erythema: No Hemosiderin Staining:No Mottled: No Pallor: No Rubor: No N/A Temperature: No Abnormality No Abnormality N/A Tenderness  onPalpation: Yes N/A N/A ProceduresPerformed: Debridement Debridement N/A AMBI, FEJES ([id number]) Treatment Notes Electronic Signature(s) Signed: 03/06/2022 12:00:12 PM By: [name] Maudlin MD FACS Entered By: [name] on 03/06/2022 12:00:12 Active Inactive Gover, [name] ([id number]) [place] Plan Details Patient Name: [name]. Medical Record Number: [id number] Date of Birth/Sex: 04/22/1950 (72 y.o. F) Primary Care Provider: Sandi [name] Referring Provider: Antonieta [name] in Treatment: 166 Date of Service: 03/06/2022 10:30 AM Patient Account Number: [id number] Treating RN: Adline Peals Other Clinician: Treating Provider/Extender: [name] Multidisciplinary Care Plan reviewed with physician Pressure Nursing Diagnoses: Knowledge deficit related to management of pressures ulcers Potential for impaired tissue integrity related to pressure, friction, moisture, and shear Goals: Patient/caregiver will verbalize understanding of pressure ulcer management Date Initiated: 05/22/2021 Target Resolution Date: 05/09/2022 Goal Status: Active Interventions: Assess: immobility, friction, shearing, incontinence upon admission and as needed Assess offloading mechanisms upon admission and as needed Notes: Electronic Signature(s) Signed: 03/06/2022 5:21:53 PM By: Adline Peals Entered By: Adline Peals on 03/06/2022 13:02:13 Prewitt, [name] ([id number]) Pain Assessment Details Patient Name: [name]. Medical Record Number: [id number] Date of Birth/Sex: 06-02-1950 (72 y.o. F) Primary Care Provider: Sandi [name] Referring Provider: Antonieta [name] in Treatment: 166 Date of Service: 03/06/2022 10:30 AM Patient Account Number: [id number] Treating RN: Adline Peals Other Clinician: Treating Provider/Extender: [name] Active Problems Location of Pain Site Locations Severity and  Description of Pain Patient Has Pain? Yes Pain Location: Generalized Pain Rate the pain. Current Pain Level: 3 Pain  Management and Medication Current Pain Management: Electronic Signature(s) Signed: 03/06/2022 5:21:53 PM By: Adline Peals Entered By: Adline Peals on 03/06/2022 10:50:01 Deam, [name] ([id number]) Patient/Caregiver Education Details Patient Name: [name]. Medical Record Number: [id number] Date of Birth/Gender: 1950/06/26 (72 y.o. F) Primary Care Physician: [name] Referring Physician: Sandi [name] Date of Service: 03/06/2022 10:30 AM Patient Account Number: [id number] Treating RN: Adline Peals Other Clinician: Treating Physician/Extender: Jadene Pierini in Treatment: 166 Education Assessment Education Provided To: Patient Education Topics Provided Wound/Skin Impairment: Methods: Explain/Verbal Responses: Reinforcements needed, State content correctly Electronic Signature(s) Signed: 03/06/2022 5:21:53 PM By: Adline Peals Entered By: Adline Peals on 03/06/2022 13:02:24 Zulauf, [name] ([id number]) Physical Exam Details Patient Name: [name]. Medical Record Number: [id number] Date of Birth/Sex: 07/17/1950 (72 y.o. F) Primary Care Provider: Sandi [name] Referring Provider: Antonieta [name] in Treatment: 166 Date of Service: 03/06/2022 10:30 AM Patient Account Number: [id number] Treating RN: [name] Other Clinician: Treating Provider/Extender: [name] Constitutional . Slightly tachycardic, asymptomatic.. . . No acute distress. Respiratory Normal work of breathing on room air. Notes 03/06/2022: All of the surfaces are robust with good granulation tissue. There is no furtherpressure-induced tissue damage visible. There is some slough accumulation on all of the woundsurfaces. Electronic Signature(s) Signed: 03/06/2022 12:04:16 PM By: [name] Maudlin MD  FACS Entered By: [name] on 03/06/2022 12:04:15 Bixler, [name] ([id number]) Problem List Details Patient Name: [name]. Medical Record Number: [id number] Date of Birth/Sex: 06/11/1950 (72 y.o. F) Primary Care Provider: Sandi [name] Referring Provider: Antonieta [name] in Treatment: 166 Date of Service: 03/06/2022 10:30 AM Patient Account Number: [id number] Treating RN: [name] Other Clinician: Treating Provider/Extender: [name] Active Problems ICD-10 Code Description Active Date MDM EncounterDiagnosis [phone] Pressure ulcer of left buttock, stage 4 12/30/2018 No Yes E11.622 Type 2 diabetes mellitus with other skin ulcer 12/30/2018 No Yes L89.313 Pressure ulcer of right buttock, stage 3 07/26/2021 No Yes Inactive Problems ICD-10 Code Description Active Date InactiveDate G82.20 Paraplegia, unspecified 12/30/2018 12/30/2018 M86.68 Other chronic osteomyelitis, other site 02/17/2019 02/17/2019 ERYANNA, BORSCH ([id number]) [phone] Abrasion, right lower leg, subsequent encounter 03/27/2020 03/27/2020 M86.68 Other chronic osteomyelitis, other site 11/28/2020 11/28/2020 L97.811 Non-pressure chronic ulcer of other part of right lowerleg limited to breakdown of skin 03/27/2020 03/27/2020 Resolved Problems Electronic Signature(s) Signed: 03/06/2022 12:00:03 PM By: [name] Maudlin MD FACS Entered By: [name] on 03/06/2022 12:00:03 Langford, [name] ([id number]) [place] Details Patient Name: [name]. Medical Record Number: [id number] Date of Birth/Sex: 1950-11-08 (72 y.o. F) Primary Care Provider: Sandi [name] Referring Provider: Sandi [name] Date of Service: 03/06/2022 Patient Account Number: [id number] Treating RN: [name] Other Clinician: Treating Provider/Extender: Jadene Pierini in Treatment: 166 Diagnosis Coding ICD-10 Codes Code Description [id number] Pressure  ulcer of left buttock, stage 4 E11.622 Type 2 diabetes mellitus with other skin ulcer L89.313 Pressure ulcer of right buttock, stage 3 Facility Procedures The patient participates with Medicare or their insurance follows the Medicare Facility Guidelines CPT4 Code Description Modifier Quantity [id number] - DEBRIDE WOUND 1ST 20 SQ CM OR < 1 ICD-10Diagnosis Description 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, stage 4 L89.313 Pressure ulcer of right buttock, stage 3 Physician Procedures CPT4 Code Description Modifier Quantity [id number] - WC PHYS LEVEL 4 - EST PT 25 1 ICD-10Diagnosis Description 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, stage 4 L89.313 Pressure ulcer of right buttock, stage 3 E11.622 Type 2 diabetes mellitus with other skin ulcer [id number] - WC PHYS DEBR  WO ANESTH 20 SQ CM 1 Hass, Keyira M. ([id number]) ICD-10Diagnosis Description [phone] Pressure ulcer of left buttock, stage 4 L89.313 Pressure ulcer of right buttock, stage 3 Electronic Signature(s) Signed: 03/06/2022 12:07:48 PM By: [name] Maudlin MD FACS Entered By: [name] on 03/06/2022 12:07:47 Horstman, [name] ([id number]) Treatment Notes Summary Wound #12 (Ischial Tuberosity) Wound Laterality: Left Cleanser Soap and Water Discharge Instruction: May shower and wash wound with dial antibacterial soap and water prior todressing change. Wound Cleanser Discharge Instruction: Cleanse the wound with wound cleanser prior to applying a clean dressingusing gauze sponges, not tissue or cotton balls. Peri-Wound Care Skin Prep Discharge Instruction: Use skin prep as directed Topical Gentamicin Discharge Instruction: As directed by physician Primary Dressing Dakin's Solution 0.25%, 16 (oz) Discharge Instruction: Moisten gauze with Dakin's solution Santyl Ointment Discharge Instruction: Apply nickel thick amount to wound bed as instructed Medline Woven Gauze Sponges  4x4 (in/in) Discharge Instruction: moisten with saline and pack lightly into wound Secondary Dressing Zetuvit Plus Silicone Border Dressing 5x5 (in/in) Discharge Instruction: Apply silicone border or ABD padover primary dressing as directed. Secured With Compression Wrap Compression Stockings Add-Ons Wound #15 (Ischium) Wound Laterality: Right Cleanser Soap and Water Discharge Instruction: May shower and wash wound with dial antibacterial soap and water prior todressing change. Wound Cleanser Discharge Instruction: Cleanse the wound with wound cleanser prior to applying a clean dressingusing gauze sponges, not tissue or cotton balls. Peri-Wound Care Skin Prep TIYANA, KERKSTRA ([id number]) Discharge Instruction: Use skin prep as directed Topical Gentamicin Discharge Instruction: As directed by physician Primary Dressing Dakin's Solution 0.25%, 16 (oz) Discharge Instruction: Moisten gauze with Dakin's solution Santyl Ointment Discharge Instruction: Apply nickel thick amount to wound bed as instructed Medline Woven Gauze Sponges 4x4 (in/in) Discharge Instruction: moisten with saline and pack lightly into wound Secondary Dressing Zetuvit Plus Silicone Border Dressing 5x5 (in/in) Discharge Instruction: Apply silicone border or ABD padover primary dressing as directed. Secured With Compression Wrap Compression Stockings Add-Ons Treatment Note Details Patient Name: DOMENICA, SIEMS. Medical Record Number: [id number] Date of Birth/Sex: 29-Mar-1951 (72 y.o. F) Primary Care Provider: Sandi [name] Referring Provider: Antonieta [name] in Treatment: 166 Date of Service: 03/06/2022 10:30 AM Patient Account Number: [id number] Treating RN: Adline Peals Other Clinician: Treating Provider/Extender: [name] Treatment Notes Wound #12 (Ischial Tuberosity) Wound Laterality: Left Cleanser Soap and Water Discharge Instruction: May shower and wash wound with dial  antibacterial soap and water prior todressing change. Wound Cleanser Overley, Nyara M. ([id number]) Discharge Instruction: Cleanse the wound with wound cleanser prior to applying a clean dressingusing gauze sponges, not tissue or cotton balls. Peri-Wound Care Skin Prep Discharge Instruction: Use skin prep as directed Topical Gentamicin Discharge Instruction: As directed by physician Primary Dressing Dakin's Solution 0.25%, 16 (oz) Discharge Instruction: Moisten gauze with Dakin's solution Santyl Ointment Discharge Instruction: Apply nickel thick amount to wound bed as instructed Medline Woven Gauze Sponges 4x4 (in/in) Discharge Instruction: moisten with saline and pack lightly into wound Secondary Dressing Zetuvit Plus Silicone Border Dressing 5x5 (in/in) Discharge Instruction: Apply silicone border or ABD padover primary dressing as directed. Secured With Compression Wrap Compression Stockings Add-Ons Electronic Signature(s) Signed: 03/06/2022 5:21:53 PM By: Adline Peals Entered By: Adline Peals on 03/06/2022 13:03:04 Pressnell, [name] ([id number]) Treatment Note Details Patient Name: [name]. Medical Record Number: [id number] Date of Birth/Sex: Oct 08, 1950 (72 y.o. F) Primary Care Provider: Sandi [name] Referring Provider: Antonieta [name] in Treatment: 166 Date of Service: 03/06/2022 10:30 AM Patient Account Number: [id number] Treating RN:  Adline Peals Other Clinician: Treating Provider/Extender: [name] Treatment Notes Wound #15 (Ischium) Wound Laterality: Right Cleanser Soap and Water Discharge Instruction: May shower and wash wound with dial antibacterial soap and water prior todressing change. Wound Cleanser Discharge Instruction: Cleanse the wound with wound cleanser prior to applying a clean dressingusing gauze sponges, not tissue or cotton balls. Peri-Wound Care Skin Prep Discharge Instruction: Use skin prep as  directed Topical Gentamicin Discharge Instruction: As directed by physician Primary Dressing Dakin's Solution 0.25%, 16 (oz) Discharge Instruction: Moisten gauze with Dakin's solution Santyl Ointment Discharge Instruction: Apply nickel thick amount to wound bed as instructed Mcpeters, [name] ([id number]) Medline Woven Gauze Sponges 4x4 (in/in) Discharge Instruction: moisten with saline and pack lightly into wound Secondary Dressing Zetuvit Plus Silicone Border Dressing 5x5 (in/in) Discharge Instruction: Apply silicone border or ABD padover primary dressing as directed. Secured With Compression Wrap Compression Stockings Add-Ons Electronic Signature(s) Signed: 03/06/2022 5:21:53 PM By: Adline Peals Entered By: Adline Peals on 03/06/2022 13:03:04 Crudup, [name] ([id number]) Wound Assessment Details Patient Name: [name]. Medical Record Number: [id number] Date of Birth/Sex: 1950/05/03 (72 y.o. F) Primary Care Provider: Sandi [name] Referring Provider: Antonieta [name] in Treatment: 166 Date of Service: 03/06/2022 10:30 AM Patient Account Number: [id number] Treating RN: Adline Peals Other Clinician: Treating Provider/Extender: [name] Wound Status Wound Number: 12 Wound Location: Left Ischial Tuberosity Wounding Event: Pressure Injury Date Acquired: 09/06/2018 Weeks Of Treatment: 166 Clustered Wound: No PrimaryEtiology: Pressure Ulcer WoundStatus: Open ComorbidHistory: Anemia, Hypertension, Type IIDiabetes, Osteoarthritis, Dementia,Paraplegia, Received Radiation Photos Wound Measurements Length: (cm) 4.1 Width: (cm) 7 Depth: (cm) 2.7 Area: (cm) 22.541 Volume: (cm) 60.861 % Reduction in Area: -9451.3% % Reduction in Volume: -28608% Epithelialization: Small (1-33%) Tunneling: No Undermining: No Wound Description Classification: Category/Stage IV Wound Margin: Well defined, not attached Foul  Odor After Cleansing: No Slough/Fibrin? Yes Majerus, POPPY BOELENS ([id number]) Exudate Amount: Medium Exudate Type: Serosanguineous Exudate Color: red, brown Wound Bed Granulation Amount: Large (67-100%) Granulation Quality: Red, Pink Necrotic Amount: Small (1-33%) Necrotic Quality: Adherent Slough Exposed Structure Fascia Exposed: No Fat Layer (Subcutaneous Tissue) Exposed: Yes Tendon Exposed: No Muscle Exposed: No Joint Exposed: No Bone Exposed: No Periwound Skin Texture Texture No Abnormalities Noted: No Callus: No Crepitus: No Excoriation: No Induration: No Rash: No Scarring: Yes Moisture No Abnormalities Noted: No Dry / Scaly: Yes Maceration: No Color No Abnormalities Noted: No Atrophie Blanche: No Cyanosis: No Ecchymosis: No Erythema: No Hemosiderin Staining: No Mottled: No Pallor: No Rubor: No Temperature / Pain Temperature: No Abnormality Tenderness on Palpation: Yes Electronic Signature(s) Signed: 03/06/2022 5:21:53 PM By: Adline Peals Entered By: Adline Peals on 03/06/2022 10:59:30 Yankovich, [name] ([id number]) Wound Assessment Details Patient Name: [name]. Medical Record Number: [id number] Date of Birth/Sex: June 08, 1950 (72 y.o. F) Primary Care Provider: Sandi [name] Referring Provider: Antonieta [name] in Treatment: 166 Date of Service: 03/06/2022 10:30 AM Patient Account Number: [id number] Treating RN: Adline Peals Other Clinician: Treating Provider/Extender: [name] Wound Status Wound Number: 15 Wound Location: Right Ischium Wounding Event: Pressure Injury Date Acquired: 07/21/2021 Weeks Of Treatment: 31 Clustered Wound: No PrimaryEtiology: Pressure Ulcer WoundStatus: Open ComorbidHistory: Anemia, Hypertension, Type IIDiabetes, Osteoarthritis, Dementia,Paraplegia, Received Radiation Photos Wound  Measurements Length: (cm) 2.2 Width: (cm) 2.1 Depth: (cm) 2.6 Area: (cm) 3.629 Volume: (cm) 9.434 % Reduction in Area: 78.2% % Reduction in Volume: -468% Epithelialization: None Tunneling: No Undermining: No Wound Description Classification: Category/Stage III Wound Margin: Distinct, outline attached Foul Odor After Cleansing: No Slough/Fibrin? Yes Zook, Vanita  M. ([id number]) Exudate Amount: Medium Exudate Type: Serosanguineous Exudate Color: red, brown Wound Bed Granulation Amount: Large (67-100%) Granulation Quality: Red Necrotic Amount: Small (1-33%) Necrotic Quality: Adherent Slough Exposed Structure Fascia Exposed: No Fat Layer (Subcutaneous Tissue) Exposed: Yes Tendon Exposed: No Muscle Exposed: No Joint Exposed: No Bone Exposed: No Periwound Skin Texture Texture No Abnormalities Noted: No Callus: No Crepitus: No Excoriation: No Induration: No Rash: No Scarring: Yes Moisture No Abnormalities Noted: No Dry / Scaly: No Maceration: Yes Color No Abnormalities Noted: No Atrophie Blanche: No Cyanosis: No Ecchymosis: No Erythema: No Hemosiderin Staining: No Mottled: No Pallor: No Rubor: No Temperature / Pain Temperature: No Abnormality Electronic Signature(s) Signed: 03/06/2022 5:21:53 PM By: Adline Peals Entered By: Adline Peals on 03/06/2022 10:59:57 Wurtzel, [name] ([id number]) Chief Complaint Document Details Patient Name: [name]. Medical Record Number: [id number] Date of Birth/Sex: [birth year] (72 y.o. F) Primary Care Provider: Sandi [name] Referring Provider: Antonieta [name] in Treatment: 166 Date of Service: 03/06/2022 10:30 AM Patient Account Number: [id number] Treating RN: [name] Other Clinician: Treating Provider/Extender: [name] Information Obtained from: Patient Chief Complaint She is here for follow up evaluation of left  ischial pressure ulcer 12/30/2018; patient comes back for review of wounds in the same general area over the previouslyhealed left ischial pressure ulcer Electronic Signature(s) Signed: 03/06/2022 12:00:23 PM By: [name] Maudlin MD FACS Entered By: [name] on 03/06/2022 12:00:23 Davee, [name] ([id number]) HPI Details Patient Name: [name]. Medical Record Number: [id number] Date of Birth/Sex: 1950-09-28 (72 y.o. F) Primary Care Provider: Sandi [name] Referring Provider: Antonieta [name] in Treatment: 166 Date of Service: 03/06/2022 10:30 AM Patient Account Number: [id number] Treating RN: [name] Other Clinician: Treating Provider/Extender: [name] History of Present Illness Location : open ulceration of the left gluteal area, left heel and right ankle for about 5 months. Quality : Patient reports No Pain. Severity : Patient states wound(s) are getting worse. Duration : Patient has had the wound for > 5 months prior to seeking treatment at the wound center Context : The wound occurred when the patient has been paraplegic for about 3 years. Modifying Factors : Wound improving due to current treatment. Associated Signs and Symptoms : Patient reports having foul odor. HPI Description : this 72 year old patient who is known to have hypertension, hypothyroidism, breastcancer, chronic pain syndrome, paraplegia was noted to have a left gluteal decubitus ulcer and wasbrought into the hospital. During the course of her hospitalization she was debrided in the operatingroom by ankle wound. Bone cultures were taken at that time but were negative but clinically she wastreated for osteomyelitis because of the probing down to bone and open exposed bone. Home healthhas been giving her antibioticss which include vancomycin and Zosyn. The patient was a smoker until about 3 weeks ago and used to smoke about 10 cigarettes a day for along while. 12/13/2014 -  details of her operative note from 11/03/2014 were reviewed -- PROCEDURE: 1.Excisional debridement skin, subcutaneous, muscle left ischium 35 cm2 2. Excisional debridement skin,subcutaneous tissue left heel 27 cm2 3. Excisional debridement right ankle skin, subcutaneous, bone 30cm2 01/24/2015 -- she has some issues with her wheelchair cushion but other than that is doing very welland has received Podus boots for her feet. 02/14/2015 -- she was using her old offloading boots and this seemed to have caused her a newpressure ulcer on the left posterior heel near the superior part just below the Achilles tendon. 03/07/2015 -- she has a new ulceration just  to the left of the midline on her sacral region more on theleft buttock and this has been there for [name] and had all the wounds sharply debrided. The Wickstrom, Winna M. ([id number]) debridement was done for the left ischial wound, the left heel wound and the right about a week. 08/22/2015 -- was recently admitted to hospital between May 5 and 08/13/2015, with sepsis andleukocytosis due to a UTI. she was treated for a sepsis complicating [name] UTI and kidneystones. She also had metabolic and careful up at the secondary to pyelonephritis. He received broad-spectrum antibiotics initially and then received Macrobid as per urology. She was sent home onnitrofurantoin. during her admission she had a CT scan which showed exposed left ischial tuberositywithout evidence of osteolysis. 09/12/2015-- the patient is having some issues with her air mattress and would like to get a opinionfrom medical modalities. 10/10/2015 -- the issue with her air mattress has not yet been sorted out and the new problem seemsto be a lot of odor from the wound VAC. 11/27/2015 -- the patient was admitted to the hospital between July 23 and 10/31/2015. Her problemswere sepsis, osteomyelitis of the pelvic bone and acute pyelonephritis. CT of the abdomen and pelviswas  consistent with a left-sided pyelonephritis with hydronephrosis and also just showed new sclerosisof the posterior portion of the left anterior pubic ramus suggestive of periosteal reaction consistent withosteomyelitis. She was treated for the osteomyelitis with infectious disease consult recommending 6weeks of IV antibiotics including vancomycin and Rocephin and the antibiotics were to go on until09/07/2015. He was seen by [name] plastic surgery and [name] of infectious disease. Shehad a suprapubic catheter placed during the admission. CT scan done on 10/28/2015 showed specifically -- New sclerosis of the posterior portion of the leftinferior pubic ramus with aggressive periosteal reaction, consistent with osteomyelitis, with adjacentsoft tissue gas compatible with previously described decubitus ulcer. 12/12/2015 -- she was recently seen by [name], who noted good improvement and CRP and ESRcompared to before and he has stopped her antibiotic as per plans to finish on September 4. Thepatient was encouraged to continue with wound care and consider hyperbaric oxygen therapy. Today she tells me that she has consented to undergo hyperbaric oxygen therapy and we can start thepaperwork. 01/02/2016 -- her PCP had gained about 3 years but she still persists in having problems duringhyperbaric oxygen therapy with some discomfort in the ears. 01/09/16; pressure area with underlying osteomyelitis in the left buttock. Wound bed itself has someslight amount of grayish surface slough however I do not think any debridement was necessary. Thereis no exposed bone soft tissue appears stable. She is using a wound VAC 01/16/16; back for weekly wound review in conjunction with HBO. She has a deep wound over the leftinitial tuberosity previously treated with 6 weeks of IV antibiotics for osteomyelitis. Wound bed looksreasonably healthy although the base of this is still precariously close to bone. She has been using  awound VAC. 01/23/2016 -- she has completed her course of antibiotics and this week the only new thing is her rightgreat toe nail was avulsed and she has got an open wound over the nailbed. LYNISHA, CRAZE ([id number]) 01/31/16 she has completed her course of antibiotics. Her right great toenail avulsed last week andshe's been using silver alginate for this as well. Still using a wound VAC to the substantial stage IVwound over the left ischial tuberosity 03/05/2016 -- the patient has had a opinion from the plastic surgery group at [facility] and [facility] this  are not available yet but the patient's verbal report has been heard by me. Did not sound likethere was any optimistic discussion regarding reconstruction and the net result would be to continuewith the wound VAC application. I will await the official reports. Addendum: -- she was seen at [facility] by Dr. Tressa Busman.After a thorough review and from what I understand spending 45 minutes with the patient hisassessment has been noted by me in detail and the management options were: 1. Continued pressure offloading and wound care versus operative procedures including wound excision 2. Soft tissue and bone sampling 3. If the wound gets larger wound closure would be done using a variety of plastic surgical techniquesincluding but not limited to skin substitute, possible skin graft, local versus regional flaps, negativepressure dressing application. 4. He discussed with her details of flap surgery and the risks associated 5. He made a comment that since the patient was operated on by [name] of [facility] plastic surgery unit in [place] the patient may continue to follow-up there for furtherevaluation for surgical flap closure in the future. 03/19/2016 -- the patient continues to be rather depressed and frustrated with her lack of rapidprogress in healing this wound especially because she  thought after hyperbaric oxygen therapy thewound would heal extremely fast. She now understands that was not the implied benefit on wound carewhich was the recommendation for hyperbaric oxygen therapy. I have had a lengthy discussion with the patient and her husband regarding her options: 1. Continuewith collagen and wound VAC for the primary dressing and offloading and all supportive care. 2. See KathrynThimmappa for possible placement of Acell or Integra in the OR. 3. get a second opinion from a wound care center and surrounding regions/counties 05/07/2016 -- Note from Dr. Celedonio Miyamoto, who noted that the patient has declined flap surgery.She has discussed application of A cell, and try a few applications to see how the wound progresses.She is also recommended that we could apply products here in the wound center, like Oasis. during her preop workup it was found that her hemoglobin A1c was 11% and she has now beendiagnosed as having diabetes mellitus and has been put on appropriate treatment by her PCP 05/28/2016 -- tells me her blood sugars have been doing well and she has an appointment to see herPCP in the next couple of weeks to check her hemoglobin A1c. Other than that she continues to do well. 06/25/2016 -- have not seen her back for the last month but she says her health has been about thesame and she has an appointment to check the A1c next week 09/10/16 ---- was seen by Dr. Celedonio Miyamoto -- who applied Acell and saw her back in follow-up. Shehas recommended silver alginate to the wound every other day and cover with foam. If no significant Meadors, Bradleigh M. ([id number]) drainage could transition to collagen every other day. She recommended discontinuing wound VAC.There were no plans to repeat application of Acell. The patient expressed that her husband could do thewound care as going to the Wound Ctr., would cost several $100 for each visit. 10/21/2016 -- her insurance company is getting her  new mattress and she is pleased about that. Jenita Seashore that she has been doing dressings with PolyMem Silver and has been doing very well 02/18/2017 -- she has gone through several changes of her mattress and has not been pleased withany of them. The ventricles are still working on trying to fit her with the appropriate low air-lossmattress.  She has a new wound on the gluteal area which is clearly separated from the original wound. 03/25/17-she is here in follow-up evaluation for her left ischialpressure ulcer. She remains unsatisfiedwith her pressure mattress. She admits to sitting multiple hours a day, in the bed. We have discussedoffloading options. The wound does not appear infected. Nutrition does not appear to be a concern. Willfollow-up in 4 weeks, if wound continues to be stalled may consider x-ray to evaluate for refractoryosteomyelitis. 04/21/17; this is a patient that I don't know all that well. She has a chronic wound which at one pointhad underlying osteomyelitis in the left ischial tuberosity. This is a stage IV pressure ulcer. Over the last3 months she has a stage II wound inferiorly to the original wound. The last time she was here herdressing was changed to silver collagen although the patient's husband who changes the dressing saidthat the collagen stuck to the wound and remove skin from the superficial area therefore he switchedback to Snyder 05/13/17; this is a patient we've been following for a left ischial tuberosity wound which was stage IV atone point had underlying osteomyelitis. Over the last several months she's had a stage II wound justinferior and medial to the related to the wound. According to her husband he is using Endoform layerwith collagen although this is not what I had last time. According to her husband they are usingEndoform Neomia Dear with collagen although I don't quite know how that started. She was hospitalized from 1/20 through 04/30/16. This was related to a UTI. Her  blood cultures werenegative, urine culture showed multiple species. She did have a CT scan of the abdomen and pelviswhich documented chronic osteomyelitis in the area of the wound inflammatory markers wereunremarkable. She has had prior knowledge of osteomyelitis. It looks as though she received IVantibiotics in 2017 and was treated with a course of hyperbaric oxygen. 05/28/17; the wound over the left ischial tuberosity is deeper today and abuts clearly on bone. Nursingintake reported drainage. I therefore culture of the wound. The more superficial area just below thislooks about the same. They once again complained that there are mattress cover is not workingalthough apparently [facility] is been noted to see this many times in the report is that thedevice is functional 06/18/17; the patient had a probing area on the left ischial tuberosity that was draining purulent fluidlast time. This also clearly seemed to have open bone. Culture I did showed pansensitive pseudomonasincluding third generation cephalosporins. I treated this with cefdinir 300 twice a day for 10 days andthings seem to have improved. She has a more superficial wound just underneath this area. Goller, Morgaine M. ([id number]) Amazingly she has a new air mattress through [facility]. I think they gave this to her as aparking give. In any case this now works according to the patient may have something to do with whythe areas are looking better. 07/09/17; the patient has a probing area in the left ischial tuberosity that still has some depth. Howeverthis is contracted in terms of the wound orifice although the depth is still roughly the same. There is noundermining. -She also has the satellite wound which is more superficial. This appears to have a healthy surfacewe've been using silver collagen 08/06/17; the patient's wound is over the left ischial tuberosity and a satellite lesion just underneath this.The original wound was actually a  deep stage 4 wound. We have made good progress in 2 months andthere is no longer exposed bone here. 09/03/17; left ischial tuberosity actually  appears to be quite healthy. I think we are making progress. Nodebridement is required. There is no surrounding erythema 10/01/17 I follow this patient monthly for her left ischial tuberosity wound. There is 2 areas the originalarea and a satellite area. The satellite area looks a lot better there is no surrounding erythema. Her husband relates that he is having trouble maintaining the dressing. This has to do with the softtissue around it. He states he puts the collagen in but he cannot make sure that it stays in even withthe ABD pads and tape that he is been using 10/29/17; patient arrives with a better looking noon today. Some of the satellite lesions have closed.using Prisma 11/26/17; the patient has a large cone-shaped area with the tip of the Cone deep within her buttock softtissue. The walls of the Cone are epithelialized however the base is still open. The area at the base ofthis looks moist we've been using silver collagen. Will change to silver alginate 12/31/2017; the wound appears to have come in fairly nicely. Using silver alginate. There is nosurrounding maceration or infection 01/28/18; there is still an open area here over the left initial tuberosity. Base of this however lookshealthy. There is no surrounding infection 02/25/18; the area of its open is over the left ischial tuberosity. The base of this is where the wound is.This is a large inverted cone-shaped area with the wound at the tip. Dimensions of the wound at the tipare improved. There is a area of denuded skin about halfway towards the tip which her husband Ladon Applebaum have happened today when he was bathing her. 04/20/17; the area is still open over the left initial tuberosity. 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 This is nowcompletely closed over although it is closed over with a divot and skin over bone at the base of this.Continued aggressive offloading will be necessary. 12/30/2018 READMISSION This is a 72 year old woman with chronic paraplegia. I picked her up for her care from Dr. Con Memos in this [place]. ([id number]) clinic after he departed. She had a stage IV pressure wound over the left ischial tuberosity. She wastreated twice for her underlying osteomyelitis and this I believe firstly in 2016 and again in 2017. Therewere some plans at some point for flap closure of this however she was discovered to have uncontrolleddiabetes and I do not think this was ever accomplished. She ultimately healed over in this clinic and wasdischarged in May. She has a large cone-shaped indentation with the tip of this going towards the leftischial tuberosity. It is not an easy area to examine but at that time I thought all of this wasepithelialized. Apparently there was a reopening here shortly after she left the clinic last time. She was admitted tohospital at the end of June for Klebsiella bacteremia felt to be secondary to UTI. A CT scan of the pelvisis listed below and there was initially some concern that she had underlying osteomyelitis although Ibelieve she was seen by infectious disease and that was felt to be not the case: I do not see any newcultures or inflammatory markers IMPRESSION: 1. No CT evidence for acute intra-abdominal or pelvic abnormality. Large volume of stool throughout the colon. 2. Enlarged fatty liver with fat sparing near the gallbladder fossa 3. Cortical scarring right kidney. Bilateral intrarenal stones  without hydronephrosis. Thick-walled urinary bladder decompressed by suprapubic catheter 4. Deep left decubitus ulcer with underlying left ischial changes suggesting  osteomyelitis. Her husband has been using silver collagen in the wound. She has not been systemically unwell nofever chills eating and drinking well. They rigorously offload this wound only getting up in thewheelchair when she is going to appointments the rest of the time she is in bed. 10/8; wound measures larger and she now has exposed bone. We have been using silver alginate 11/12 still using silver alginate. The patient saw Dr. Megan Salon of infectious disease. She was started onAugmentin 500 mg twice daily. She is due to follow-up with Dr. Megan Salon I believe next week. Lab workDr. Megan Salon requested showed a sedimentation rate of 28 and CRP of 20 although her CRP 1 year agowas 18.8. Sedimentation rate 1 year ago was 11 basic metabolic panel showed a creatinine of 1.12 12/3; the patient followed up with Dr. Megan Salon yesterday. She is still on Augmentin twice daily. Thiswas directed by Dr. Megan Salon. The patient's inflammatory markers have improved which is gratifying.Her C-reactive protein was repeated yesterday and follow-up booked with infectious disease in January. In addition I have been getting secure text messages I think from palliative care through the [facility]. I think they were hoping to provide services to the patient in her home.They could not get a hold of the primary physician and so they reached out to me on 2 separateoccasions. 12/17; patient last saw Dr. Megan Salon on 12/2. She is finishing up with Augmentin. Her C-reactiveprotein was 20 on 10/21, 10.1 on 11/19 and 17 on 12/2. The wound itself still has depth and Pitz, Kitara M. ([id number]) undermining. We are using Santyl with the backing wet-to-dry 04/27/2019. The wound is gradually clearing up in terms of the surface although it is not filled in thatmuch. Still abuts right against bone 2/4; patient with a deep pressure ulcer over the left ischial tuberosity. I thought she was going tofollow-up with infectious  disease to follow her inflammatory markers although the patient states thatthey stated that they did not need to see her unless we felt it was necessary. I will need to check theirnotes. In any case we ordered moistened silver collagen back with wet-to-dry to fill in the depth of thewound although apparently [facility] sent silver alginate which they have been using since they were herethe last time. Is obviously not what we ordered. 2/25. Not much change in this wound it is over the left ischial tuberosity recurrent wound. We havebeen using silver collagen with backing wet-to-dry. I think the wound is about the same. There is stillsome tunneling from about 10-12 o'clock over the ischial tuberosity itself 3/11; pressure ulcer over the left ischial tuberosity. Since she was last here the wound VAC was startedand apparently going quite well. We are able to get the home health company that accepts [employer] which is in itself sometimes problematic. There is been improvements in the wound thetunneling seems to be better and is contracted nicely 4/8; 1 month follow-up. Since she was last here we have been using silver collagen under a woundVAC. Some minor contraction I think in wound volume. She is cared for diligently by her husbandincluding pressure relief, incontinence management, nutritional support etc. 6/1; this is almost a 38-monthfollow-up. She is been using silver collagen under wound VAC. Circulararea over the left ischial tuberosity. She has been using silver collagen under wound VAC 7/8; 1 month follow-up. Silver collagen under the VAC not really a  lot of progress. Tissue at the base ofthe wound which is right against bone and the tissue next that this does not look completely viable. Sheis not currently on any antibiotics, she had underlying osteomyelitis I need to look this over 8/16; we are using silver collagen under wound VAC to the left ischial tuberosity wound. Comes in [place] absolutely no  change in surface area or depth. There is no exposed bone. I did look over herinfectious disease notes as I said I would do last time. She last saw Dr. Megan Salon in December 2020.She completed 6 weeks of Augmentin. This was in response to a bone culture I did showing methicillinsusceptible staph aureus and Enterococcus. She was supposed to come back to see Dr. Cathren Harsh point although they say that that appointment was canceled unless I chose to recommend return.I think there was supposed to be follow-up with inflammatory markers but I cannot see that that wasever done. She has not been on antibiotics since 9/21; monthly follow-up. We received a call from home health nurse last evening to report greendrainage coming out of the wound. Lab work I ordered last time showed a white count of 5.2 asedimentation rate of 45 and a C-reactive protein of 25 however neither one of the 2 values aresubstantially different from her previous values in October 2020 or December 2020. Both are slightlyhigher but only marginally. Otherwise no new complaints from the patient or her husband 10/19; 1 month follow-up. PCR culture I did last time showed medium quantities of Pseudomonas lowerquantities Klebsiella and Enterococcus faecalis group B strep and Peptostreptococcus. I gave herAugmentin for 2 weeks. I am not really sure of my choice of this I would not cover Pseudomonas. She isstill having green drainage. Wound itself looks satisfactory there is not a lot of depth wound bed lookshealthy Balaguer, [name] ([id number]) 11/16; patient has completed the antibiotics still using gentamicin and silver alginate on the wound.There is improvement in the surface area 12/21; in general the area on the buttock looks somewhat better. Surface looks healthy although I donot know that there is been much improvement in the wound volume. We have been using silveralginate and Hydrofera Blue. Less drainage. In passing the husband showed me an  abrasion injury on the left anterior tibia. Covered in necroticsurface. He has noticed this for about a week and has been putting silver collagen on it. He iscompletely uncertain about how this happened 1/25; monthly follow-up. The area on the left buttock is about the same. This does not go to bone but afairly deep wound surface of the wound is of questionable viability. The abrasion injury that they showed me last time apparently was closed out by home health becausethey thought it was healed but certainly is not although it is just about healed. As a result they haven'tbeen applying anything to this area Finally I did discuss with the patient and her husband the idea of an advanced treatment product to tryand get a proper base to this wound I was thinking of Puraply however actually the patient points outthat her co-pay for coming to visit [place]. the facility, charge would be unaffordable if they have to, on aweekly basis 2/22; pressure ulcer on the left buttock appears deeper to me and abuts on the ischial tuberosity. Ithought initially there was exposed bone but there is a rim of tissue over this area. She also has asuperficial over the right anterior mid tibia. Been using silver collagen to these areas without muchsuccess. I have looked  over the patient's past history with regards to the area on the left ischium. She did haveunderlying osteomyelitis here dating back I think to late 2020. She saw Dr. Megan Salon she received a 6-week course of oral antibiotics in response to a bone culture that I did. This does not appear to beinfected but it certainly has not been improving in terms of granulation. I do not believe she has hadany recent imaging studies 3/29; 1 month follow-up. Pressure ulcer on the left buttock which she has been dealing with with for anumber of years. She was treated for underlying osteomyelitis at 1.2 or 3 years ago I think withinfectious disease help. She also had I think a flap closure  by [name] and that lasted for about ayear and then reopened. I have not been able to get this patient to progress towards healing althoughtruthfully the wound is absolutely no worse. We have been using Hydrofera Blue 4/26; patient presents for 1 week follow-up. She has been using silver collagen to the area every otherday. She has home health that comes out once a week to help with dressing changes as well. Thepatient is interested in trying a skin substitute over this area. She states she is trying to relievepressure off of it most of the day. BITTANY, PIERSMA ([id number]) 5/24; patient presents for 1 month follow-up. She has been using silver collagen to the area every otherday. She has no complaints today. She is interested in the skin substitute. She tries to leave relievepressure off Her bottom however is not able to most of the day 6/14; using silver collagen to the area over the left ischial tuberosity. Wound does not appear to bedoing particularly well. Open to bone 6/28; the patient patient presented last time with a marked deterioration. Depth probing all the way tobone. The bone itself did not look particularly viable. In spite of this the x-ray I did showed longstandingulcer over the left ischial tuberosity with chronic bone involvement/reaction that was also seen by CT in2020. Lab work did not show just active infection with a sed rate of 14 and a C-reactive protein of 7.1.Her comprehensive metabolic panel was normal including an albumin of 4.1 white count was 6 7/19; patient was here 3 weeks ago with a marked deterioration in her wound over the left ischialtuberosity. I ordered a CT scan of the area for 1 reason or another this just did not get done. It is nowbooked for 8 days from now. She was supposed to come back for bone culture and pathology. That didnot happen either. We have been using silver collagen. As usual she is diligently looked after by herhusband 8/24; 5-week follow-up.  Since the patient was last here the biopsy that I did of her ischial tuberositycame back suggesting osteomyelitis. Culture showed strep. I started her on Augmentin. She was seenby infectious disease Dr. Lucianne Lei dam and the Augmentin was continued and she is still taking it. CT scandid not suggest anything other than chronic osteomyelitis without much change from her previousstudy. Her C-reactive protein was only 7 and sedimentation rate was 1.1. We are still using silver collagen to the wound. She has home health. Her husband is very diligent in hercare for this reason I have never pursued a diverting colostomy. She would not agree to this surgery inany case. Finally we have discussed plastic surgery with him in the past and she is not interested in amyocutaneous flap. 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 Since she was last here  she was in the ER last week with abdominal pain. She was found to be impactedhowever in the course of the review there somebody gave her some IV morphine and apparently shedeveloped hives and blisters. There is still tense blisters on her plantar left fifth and fourth fingers 10/4; the patient has completed her Augmentin and is due to follow-up with Dr. Drucilla Schmidt in [place]. Her wound today measures about the same but looks a little healthier in terms ofgranulation there is no exposed bone. I note that she was admitted to hospital for 2 days from 9/21through 9/23 for delirium. She had received Versed for glaucoma surgery ultimately that was felt to bethe etiology. Her blood cultures were negative she had 30,000 colonies of Pseudomonas in her urine.She did receive broad-spectrum antibiotic therapy but ultimately her wound on the buttock was not feltto be the cause. As mentioned she has completed her antibiotics still using silver collagen 11/1; patient has completed her antibiotics for the underlying osteomyelitis. She apparently follows upwith Dr. Drucilla Schmidt next week. The wound does  not look too bad perhaps slightly narrower in terms ofwidth but the depth is about the same. We have been using Hydrofera Blue. The patient talks to [facility] a wound VAC and made it sound as though she was recently on 1 although I do not see this. [employer], SHAUNTAI GALPERIN. ([id number]) other option is an advanced treatment product like Oasis but that means weekly trips into the clinic.She has previously said she does not want an attempt at plastic surgery 11/15; the patient saw Dr. Drucilla Schmidt noted that her follow-up inflammatory markers normalized. She hascompleted her antibiotics. We are currently using Hydrofera Blue. Wound itself has some surface tissue over bone but certainly not a lot. I have looked back over her history. The patient has had recurrent osteomyelitis in this area as she isreceived prolonged courses of antibiotics. We did use a wound VAC for a prolonged period of time in2021. We had some improvement especially in undermining areas but overall not a lot of measurableimprovement. Once again I have tried to think about using advanced treatment products in this areasomething that would require them visiting the clinic very frequently and they did not seem to want todo this. Most recently we ran [place] however she would have a [id number] per application co-pay [id number]; left ischial tuberosity. I do not see much difference in 3 weeks. There is no exposed bone. We areusing Hydrofera Blue. Our intake nurse reports some "greenish" drainage 12/21 left ischial tuberosity. Measuring slightly smaller in surface area. The wound still has some tissueover bone i.e. there is no exposed bone. No overt infection. We did a deep tissue culture last time for PCR. The major bacteria was Pseudomonas although therewere medium titers of Enterococcus faecalis Klebsiella E. [name] and Morganella as well asPeptostreptococcus. Keystone antibiotic include streptomycin and vancomycin they got this lastweekend and used it twice 04/17/2021;  no change in this wound. It does not have undermining however the surface is of it doesnot look particularly viable. We have been using topical antibiotic directed at a PCR culture as well assilver alginate. It is clear in talking to the patient and her husband that she does not offload this area adequatelyincluding spending all night on this with I think a level 2 surface on her bed. I have told her that this isnot adequate to heal a wound like this.. 2/15; Oasis with a 0000000 co-pay per application. They are using silver alginate to the wound offloadingas best they can  according to her husband 06/19/2021: At her last visit, Dr. Dellia Nims changed her to silver collagen, but apparently they did notreceive this until just last week. They have continued using silver alginate in the wound. Today, thewound is quite malodorous with necrotic slough. They ran out of [place] topical about 2 months ago.No new cultures have been taken.ZADA, HULTIN ([id number]) 07/17/2021: At the last visit, the wound was in pretty poor condition with a lot of necrotic slough andsubstantial malodorous drainage. I did take a new culture for PCR, but for some reason this was neverresulted. Nonetheless, they did receive a renewal of their [employer] apparently it was communicated tothem that they should use silver alginate rather than the silver collagen. Regardless, the wound is muchcleaner at this visit and there is no odor. 07/26/2021: The sacral/left ischial ulcer is fairly clean, but there was some greenish drainageappreciated on the dressing. They have been using topical Keystone with silver alginate. She hasunfortunately developed a new ulcer on her right ischium. It is unstageable with a thick layer of eschar.It is malodorous. 08/14/2021: I took a new PCR culture at her last visit from the new wound that had opened on her rightischial tuberosity. This returned with a polymicrobial population. A new topical Keystone compound  wasformulated. They have been using this on her wounds along with silver alginate. I also prescribed acourse of oral antibiotic, Augmentin and Levaquin to try and address the multiple species with variousresistance these that grew out. She was able to take this for about 10 days, but it has started to [facility] nauseated and she is actually thrown up on occasion. Since she takes them at the same time, she isnot sure which one is causing her issue. She has also been having a lot more drainage from her leftischial wound. Her husband says that he has not been using the zinc oxide as much because it isdifficult to wash off when he bathes her. We have been using Santyl to the new wound under silveralginate and silver alginate to the left ischial wound. 09/11/2021: Unfortunately, there has been fairly substantial breakdown of both of her wounds. It soundslike the gel compound that the [employer] was being mixed in just leaked out everywhere and left thepatient wet and macerated. There is worsening necrotic tissue and increased depth on the right ischialwound and the left one has extended further. They have not been using the zinc oxide asrecommended. They contacted [facility] and were advised to simply apply the powder directly to thewounds. 10/09/2021: The right ischial wound has some necrotic tissue on the lateral aspect and into the base, butno purulent drainage and no surrounding erythema or induration. On the left ischial wound, there isleathery nonviable skin extending laterally from the main wound. No significant odor or drainage fromthis site, either. 11/05/2021: The right ischial wound has more necrotic fibrinous tissue at the base. The leathery escharon the left ischial wound is beginning to lift at one of the edges and may be amenable to debridementtoday. We have been using topical powdered mupirocin to her wounds with silver alginate. 12/03/2021: Significant improvement in both of the wounds. There is still some  fibrinous slough on theright ischial wound. There is also a heavy layer of slough and fibrinous exudate on the left ischial woundas well as some soft light slough on the remainder of the wound surface. We have been using Santyl forenzymatic debridement between clinic visits. FAITHE, HILDRETH ([id number]) 12/31/2021: The right ischial wound is cleaner today with very minimal slough. The  left ischial woundhas an area of devitalized subcutaneous tissue and the most lateral extension of the wound. There isslough and biofilm on the surface, but overall the wounds are looking significantly better. We have beenunable to obtain Santyl and they have just been doing wet-to-dry dressing changes with good effect. 01/30/2022: The left ischial wound is very clean with just a little bit of slough on the posterolateralaspect of the wound. She has some pressure induced deep tissue injury on the right ischial wound, butotherwise both are quite clean. 03/06/2022: The low-air-loss mattress was finally delivered last week. Even over the short period oftime she has been using it, her husband thinks that the wounds have demonstrated an improvement inimpairments. Certainly, since our last visit, they look better. All of the surfaces are robust with goodgranulation tissue. There is no further pressure-induced tissue damage visible. There is some sloughaccumulation on all of the wound surfaces. Electronic Signature(s) Signed: 03/06/2022 12:01:31 PM By: [name] Maudlin MD FACS Entered By: [name] on 03/06/2022 12:01:30

## 2022-05-29 ENCOUNTER — Ambulatory Visit (INDEPENDENT_AMBULATORY_CARE_PROVIDER_SITE_OTHER): Payer: Medicare Other

## 2022-05-29 ENCOUNTER — Ambulatory Visit: Payer: Medicare Other | Admitting: Podiatry

## 2022-05-29 DIAGNOSIS — S82891D Other fracture of right lower leg, subsequent encounter for closed fracture with routine healing: Secondary | ICD-10-CM

## 2022-05-29 DIAGNOSIS — L97511 Non-pressure chronic ulcer of other part of right foot limited to breakdown of skin: Secondary | ICD-10-CM | POA: Diagnosis not present

## 2022-05-29 NOTE — Progress Notes (Signed)
Subjective: Chief Complaint  Patient presents with   Foot Ulcer    Right foot and ankle ulcers, patient is having some bleeding, X-rays taken today      72 year old female presents the office with her husband for follow-up evaluation of right ankle ulcer, fracture.  Husband states that when he washes the foot skin comes off.  Recently developed new wounds.  There is a new wound to the posterior heel, Achilles area.  It does bleed at times.  There is no purulence that they report.  No recent injuries or changes otherwise.  Currently denies any fevers or chills.   Objective: AAO x3, NAD DP/PT pulses palpable bilaterally, CRT less than 3 seconds Right ankle: Ulceration noted along the lateral malleolus there is no probing to bone, undermining or tunneling.  Mostly granular base is noted with some fibrotic tissue.  Granular wound present to the posterior aspect of the heel, Achilles tendon is well without any probing to bone or tendon.  Superficial on the dorsal aspect of foot regularly probing.  No surrounding erythema to any of the wounds and there is no fluctuation or crepitation.  No malodor. No pain with calf compression, swelling, warmth, erythema  Assessment: Right ankle, foot ulceration, ankle fracture  Plan: -All treatment options discussed with the patient including all alternatives, risks, complications.  -X-rays were obtained reviewed of the foot and ankle.  Multiple views were obtained.  Ankle fracture noted with angular deformity.  Increased consolidation.  Appears the ankle is more stable at this time. -Given the ongoing nature of the wounds and new ulcerations which are difficult to heal would refer to the wound care center.  I have also previously ordered an ABI which was not completed and a message was sent to help follow-up on this and we will check on this for her as well. -For now continue with daily dressing changes, offloading. -Monitor for any clinical signs or symptoms  of infection and directed to call the office immediately should any occur or go to the ER.  Return in about 2 weeks (around 06/12/2022).  Trula Slade DPM

## 2022-06-05 ENCOUNTER — Other Ambulatory Visit: Payer: Self-pay | Admitting: Podiatry

## 2022-06-05 ENCOUNTER — Encounter (HOSPITAL_BASED_OUTPATIENT_CLINIC_OR_DEPARTMENT_OTHER): Payer: Medicare Other | Admitting: General Surgery

## 2022-06-05 DIAGNOSIS — L97511 Non-pressure chronic ulcer of other part of right foot limited to breakdown of skin: Secondary | ICD-10-CM

## 2022-06-05 DIAGNOSIS — L89324 Pressure ulcer of left buttock, stage 4: Secondary | ICD-10-CM | POA: Diagnosis not present

## 2022-06-05 DIAGNOSIS — S82891D Other fracture of right lower leg, subsequent encounter for closed fracture with routine healing: Secondary | ICD-10-CM

## 2022-06-11 NOTE — Progress Notes (Signed)
[name], [name] [id number] ([id number]) [id number] Page 1 of 17 Visit Report for 06/05/2022 Chief Complaint Document Details Patient Name: Date of Service: [name], [name]. 06/05/2022 10:30 A M Medical Record Number: [id number] Patient Account Number: [id number] Date of Birth/Sex: Treating RN: 09/16/1950 (72 y.o. F) Primary Care Provider: [name] Other Clinician: Referring Provider: Treating Provider/Extender: Nance Pew in Treatment: [place] from: Patient Chief Complaint She is here for follow up evaluation of left ischial pressure ulcer 12/30/2018; patient comes back for review of wounds in [name] same general area over [name] previously healed left ischial pressure ulcer Electronic Signature(s) Signed: 06/05/2022 11:36:05 AM By: Fredirick Maudlin MD FACS Entered By: Fredirick Maudlin on 06/05/2022 11:36:04 -------------------------------------------------------------------------------- Debridement Details Patient Name: Date of Service: [name]. 06/05/2022 10:30 A M Medical Record Number: [id number] Patient Account Number: [id number] Date of Birth/Sex: Treating RN: [birth year] (72 y.o. [name], [name] Primary Care Provider: [name] Other Clinician: Referring Provider: Treating Provider/Extender: Nance Pew in Treatment: 179 Debridement Performed for Assessment: Wound #17 Right Achilles Performed By: Physician Fredirick Maudlin, MD Debridement Type: Debridement Level of Consciousness (Pre-procedure): Awake and Alert Pre-procedure Verification/Time Out Yes - 11:20 Taken: Start Time: 11:22 Pain Control: Lidocaine 4% T opical Solution T Area Debrided (L x W): otal 3.1 (cm) x 4 (cm) = 12.4 (cm) Tissue and other material debrided: Viable, Non-Viable, Eschar, Subcutaneous, Tendon Level: Skin/Subcutaneous Tissue/Muscle Debridement Description: Excisional Instrument: Curette, Forceps,  Scissors Bleeding: Minimum Hemostasis Achieved: Pressure Procedural Pain: 0 Post Procedural Pain: 0 Response to Treatment: Procedure was tolerated well Level of Consciousness (Post- Awake and Alert procedure): Post Debridement Measurements of Total Wound Length: (cm) 3.1 Stage: Category/Stage IV Width: (cm) 4 Depth: (cm) 0.6 Volume: (cm) 5.843 Character of Wound/Ulcer Post Debridement: Improved Post Procedure Diagnosis Same as Pre-procedure Notes scribed by Baruch Gouty, RN for Dr. Celine Ahr Electronic Signature(s) Signed: 06/05/2022 12:28:35 PM By: Fredirick Maudlin MD FACS Signed: 06/05/2022 4:59:17 PM By: Baruch Gouty RN, BSN Hague, Woodlyn M ([id number]) [phone].pdf Page 2 of 17 Entered By: Baruch Gouty on 06/05/2022 11:28:40 -------------------------------------------------------------------------------- HPI Details Patient Name: Date of Service: [name], [name]. 06/05/2022 10:30 A M Medical Record Number: [id number] Patient Account Number: [id number] Date of Birth/Sex: Treating RN: February 23, 1951 (72 y.o. F) Primary Care Provider: [name] Other Clinician: Referring Provider: Treating Provider/Extender: Nance Pew in Treatment: 179 History of Present Illness Location: open ulceration of [name] left gluteal area, left heel and right ankle for about 5 months. Quality: Patient reports No Pain. Severity: Patient states wound(s) are getting worse. Duration: Patient has had [name] wound for > 5 months prior to seeking treatment at [name] wound center Context: [name] wound occurred when [name] patient has been paraplegic for about 3 years. Modifying Factors: Wound improving due to current treatment. ssociated Signs and Symptoms: Patient reports having foul odor. A HPI Description: this 72 year old patient who is known to have hypertension, hypothyroidism, breast cancer, chronic pain syndrome, paraplegia was noted to have a left gluteal  decubitus ulcer and was brought into [name]. During [name] course of her hospitalization she was debrided in [name] operating room by ankle wound. Bone cultures were taken at that time but were negative but clinically she was treated for osteomyelitis because of [name] probing down to bone and open exposed bone. Home health has been giving her antibioticss which include vancomycin and Zosyn. [name] patient was a smoker until about 3 weeks ago and used to  smoke about 10 cigarettes a day for a long while. 12/13/2014 - details of her operative note from 11/03/2014 were reviewed -- PROCEDURE: 1. Excisional debridement skin, subcutaneous, muscle left ischium 35 cm2 2. Excisional debridement skin, subcutaneous tissue left heel 27 cm2 3. Excisional debridement right ankle skin, subcutaneous, bone 30 cm2 01/24/2015 -- she has some issues with her wheelchair cushion but other than that is doing very well and has received Podus boots for her feet. 02/14/2015 -- she was using her old offloading boots and this seemed to have caused her a new pressure ulcer on [name] left posterior heel near [name] superior part just below [name] Achilles tendon. 03/07/2015 -- she has a new ulceration just to [name] left of [name] midline on her sacral region more on [name] left buttock and this has been there for Dr. Leland Johns and had all [name] wounds sharply debrided. [name] debridement was done for [name] left ischial wound, [name] left heel wound and [name] right about a week. 08/22/2015 -- was recently admitted to hospital between May 5 and 08/13/2015, with sepsis and leukocytosis due to a UTI. she was treated for a sepsis complicating Escherichia coli UTI and kidney stones. She also had metabolic and careful up at [name] secondary to pyelonephritis. He received broad-spectrum antibiotics initially and then received Macrobid as per urology. She was sent home on nitrofurantoin. during her admission she had a CT scan which showed exposed left ischial tuberosity without  evidence of osteolysis. 09/12/2015-- [name] patient is having some issues with her air mattress and would like to get a opinion from medical modalities. 10/10/2015 -- [name] issue with her air mattress has not yet been sorted out and [name] new problem seems to be a lot of odor from [name] wound VAC. 11/27/2015 -- [name] patient was admitted to [name] hospital between July 23 and 10/31/2015. Her problems were sepsis, osteomyelitis of [name] pelvic bone and acute pyelonephritis. CT of [name] abdomen and pelvis was consistent with a left-sided pyelonephritis with hydronephrosis and also just showed new sclerosis of [name] posterior portion of [name] left anterior pubic ramus suggestive of periosteal reaction consistent with osteomyelitis. She was treated for [name] osteomyelitis with infectious disease consult recommending 6 weeks of IV antibiotics including vancomycin and Rocephin and [name] antibiotics were to go on until 12/10/2015. He was seen by Dr. Iran Planas plastic surgery and Dr. Linus Salmons of infectious disease. She had a suprapubic catheter placed during [name] admission. CT scan done on 10/28/2015 showed specifically -- New sclerosis of [name] posterior portion of [name] left inferior pubic ramus with aggressive periosteal reaction, consistent with osteomyelitis, with adjacent soft tissue gas compatible with previously described decubitus ulcer. 12/12/2015 -- she was recently seen by Dr. Linus Salmons, who noted good improvement and CRP and ESR compared to before and he has stopped her antibiotic as per plans to finish on September 4. [name] patient was encouraged to continue with wound care and consider hyperbaric oxygen therapy. Today she tells me that she has consented to undergo hyperbaric oxygen therapy and we can start [name] paperwork. 01/02/2016 -- her PCP had gained about 3 years but she still persists in having problems during hyperbaric oxygen therapy with some discomfort in [name] ears. 01/09/16; pressure area with underlying osteomyelitis in [name]  left buttock. Wound bed itself has some slight amount of grayish surface slough however I do not think any debridement was necessary. There is no exposed bone soft tissue appears stable. She is using a wound VAC 01/16/16; back for weekly wound review  in conjunction with [facility]. She has a deep wound over [name] left initial tuberosity previously treated with 6 weeks of IV antibiotics for osteomyelitis. Wound bed looks reasonably healthy although [name] base of this is still precariously close to bone. She has been using a wound VAC. 01/23/2016 -- she has completed her course of antibiotics and this week [name] only new thing is her right great toe nail was avulsed and she has got an open wound over [name] nailbed. 01/31/16 she has completed her course of antibiotics. Her right great toenail avulsed last week and she's been using silver alginate for this as well. Still using a wound VAC to [name] substantial stage IV wound over [name] left ischial tuberosity 03/05/2016 -- [name] patient has had a opinion from [name] plastic surgery group at [facility] and details of this are not available yet but [name] patient's verbal report has been heard by me. Did not sound like there was any optimistic discussion regarding reconstruction and [name] net result would be to continue with [name] wound VAC application. I will await [name] official reports. Addendum: -- she was seen at [facility] by Dr. Tressa Busman. After a thorough review and from what I understand spending 45 minutes with [name] patient his assessment has been noted by me in detail and [name] management options were: 1. Continued pressure offloading and wound care versus operative procedures including wound excision 2. Soft tissue and bone sampling 3. If [name] wound gets larger wound closure would be done using a variety of plastic surgical techniques including but not limited to skin substitute, possible skin graft, local versus regional flaps, negative  pressure dressing application. 4. He discussed with her details of flap surgery and [name] risks associated 5. He made a comment that since [name] patient was operated on by Dr. Leland Johns of [facility] plastic surgery unit in [place] [name] patient may continue to follow-up there for further evaluation for surgical flap closure in [name] future. 03/19/2016 -- [name] patient continues to be rather depressed and frustrated with her lack of rapid progress in healing this wound especially because she thought after hyperbaric oxygen therapy [name] wound would heal extremely fast. She now understands that was not [name] implied benefit on wound care which was [name] recommendation for hyperbaric oxygen therapy. I have had a lengthy discussion with [name] patient and her husband regarding her options: 1. Continue with collagen and wound VAC for [name] primary dressing and Gervin, [name] ([id number]) [phone].pdf Page 3 of 17 offloading and all supportive care. 2. See Dr. Iran Planas for possible placement of Acell or Integra in [name] OR. 3. get a second opinion from a wound care center and surrounding regions/counties 05/07/2016 -- Note from Dr. Celedonio Miyamoto, who noted that [name] patient has declined flap surgery. She has discussed application of A cell, and try a few applications to see how [name] wound progresses. She is also recommended that we could apply products here in [name] wound center, like Oasis. during her preop workup it was found that her hemoglobin A1c was 11% and she has now been diagnosed as having diabetes mellitus and has been put on appropriate treatment by her PCP 05/28/2016 -- tells me her blood sugars have been doing well and she has an appointment to see her PCP in [name] next couple of weeks to check her hemoglobin A1c. Other than that she continues to do well. 06/25/2016 -- have not seen her back for [name] last month but she  says her health has been about [name] same and she has an  appointment to check [name] A1c next week 09/10/16 ---- was seen by Dr. Celedonio Miyamoto -- who applied Acell and saw her back in follow-up. She has recommended silver alginate to [name] wound every other day and cover with foam. If no significant drainage could transition to collagen every other day. She recommended discontinuing wound VAC. There were no plans to repeat application of Acell. [name] patient expressed that her husband could do [name] wound care as going to [name] would cost several $100 for each visit. 10/21/2016 -- her insurance company is getting her new mattress and she is pleased about that. Other than that she has been doing dressings with PolyMem Silver and has been doing very well 02/18/2017 -- she has gone through several changes of her mattress and has not been pleased with any of them. [name] ventricles are still working on trying to fit her with [name] appropriate low air-loss mattress. She has a new wound on [name] gluteal area which is clearly separated from [name] original wound. 03/25/17-she is here in follow-up evaluation for her left ischialpressure ulcer. She remains unsatisfied with her pressure mattress. She admits to sitting multiple hours a day, in [name] bed. We have discussed offloading options. [name] wound does not appear infected. Nutrition does not appear to be a concern. Will follow-up in 4 weeks, if wound continues to be stalled may consider x-ray to evaluate for refractory osteomyelitis. 04/21/17; this is a patient that I don't know all that well. She has a chronic wound which at one point had underlying osteomyelitis in [name] left ischial tuberosity. This is a stage IV pressure ulcer. Over [name] last 3 months she has a stage II wound inferiorly to [name] original wound. [name] last time she was here her dressing was changed to silver collagen although [name] patient's husband who changes [name] dressing said that [name] collagen stuck to [name] wound and remove skin from [name] superficial area  therefore he switched back to [place] 05/13/17; this is a patient we've been following for a left ischial tuberosity wound which was stage IV at one point had underlying osteomyelitis. Over [name] last several months she's had a stage II wound just inferior and medial to [name] related to [name] wound. According to her husband he is using Endoform layer with collagen although this is not what I had last time. According to her husband they are using Elgie [place] with collagen although I don't quite know how that started. She was hospitalized from 1/20 through 04/30/16. This was related to a UTI. Her blood cultures were negative, urine culture showed multiple species. She did have a CT scan of [name] abdomen and pelvis which documented chronic osteomyelitis in [name] area of [name] wound inflammatory markers were unremarkable. She has had prior knowledge of osteomyelitis. It looks as though she received IV antibiotics in 2017 and was treated with a course of hyperbaric oxygen. 05/28/17; [name] wound over [name] left ischial tuberosity is deeper today and abuts clearly on bone. Nursing intake reported drainage. I therefore culture of [name] wound. [name] more superficial area just below this looks about [name] same. They once again complained that there are mattress cover is not working although apparently [facility] is been noted to see this many times in [name] report is that [name] device is functional 06/18/17; [name] patient had a probing area on [name] left ischial tuberosity that was draining purulent fluid last time. This also  clearly seemed to have open bone. Culture I did showed pansensitive pseudomonas including third generation cephalosporins. I treated this with cefdinir 300 twice a day for 10 days and things seem to have improved. She has a more superficial wound just underneath this area. Amazingly she has a new air mattress through [facility]. I think they gave this to her as a parking give. In any case this now works  according to [name] patient may have something to do with why [name] areas are looking better. 07/09/17; [name] patient has a probing area in [name] left ischial tuberosity that still has some depth. However this is contracted in terms of [name] wound orifice although [name] depth is still roughly [name] same. There is no undermining. She also has [name] satellite wound which is more superficial. This appears to have a healthy surface we've been using silver collagen 08/06/17; [name] patient's wound is over [name] left ischial tuberosity and a satellite lesion just underneath this. [name] original wound was actually a deep stage 4 wound. We have made good progress in 2 months and there is no longer exposed bone here. 09/03/17; left ischial tuberosity actually appears to be quite healthy. I think we are making progress. No debridement is required. There is no surrounding erythema 10/01/17 I follow this patient monthly for her left ischial tuberosity wound. There is 2 areas [name] original area and a satellite area. [name] satellite area looks a lot better there is no surrounding erythema. Her husband relates that he is having trouble maintaining [name] dressing. This has to do with [name] soft tissue around it. He states he puts [name] collagen in but he cannot make sure that it stays in even with [name] ABD pads and tape that he is been using 10/29/17; patient arrives with a better looking noon today. Some of [name] satellite lesions have closed. using Prisma 11/26/17; [name] patient has a large cone-shaped area with [name] tip of [name] Cone deep within her buttock soft tissue. [name] walls of [name] Cone are epithelialized however [name] base is still open. [name] area at [name] base of this looks moist we've been using silver collagen. Will change to silver alginate 12/31/2017; [name] wound appears to have come in fairly nicely. Using silver alginate. There is no surrounding maceration or infection 01/28/18; there is still an open area here over [name] left initial tuberosity. Base of  this however looks healthy. There is no surrounding infection 02/25/18; [name] area of its open is over [name] left ischial tuberosity. [name] base of this is where [name] wound is. This is a large inverted cone-shaped area with [name] wound at [name] tip. Dimensions of [name] wound at [name] tip are improved. There is a area of denuded skin about halfway towards [name] tip which her husband thinks may have happened today when he was bathing her. 04/20/17; [name] area is still open over [name] left initial tuberosity. This is an cone shaped wound with [name] tip where [name] wound remains area there is no evidence of infection, no erythema and no purulent drainage 5/12; very fragile patient who had a chronic stage IV wound over [name] left ischial tuberosity. This is now completely closed over although it is closed over with a divot and skin over bone at [name] base of this. Continued aggressive offloading will be necessary. 12/30/2018 READMISSION This is a 72 year old woman with chronic paraplegia. I picked her up for her care from Dr. Con Memos in this clinic after he departed. She had a stage IV pressure wound  over [name] left ischial tuberosity. She was treated twice for her underlying osteomyelitis and this I believe firstly in 2016 and again in 2017. There were some plans at some point for flap closure of this however she was discovered to have uncontrolled diabetes and I do not think this was ever accomplished. She ultimately healed over in this clinic and was discharged in May. She has a large cone-shaped indentation with [name] tip of this going towards [name] left ischial tuberosity. It is not an easy area to examine but at that time I thought all of this was epithelialized. Apparently there was a reopening here shortly after she left [name] clinic last time. She was admitted to hospital at [name] end of June for Klebsiella bacteremia felt to be secondary to UTI. A CT scan of [name] pelvis is listed below and there was initially some concern that she had  underlying osteomyelitis although I believe she was seen by infectious disease and that was felt to be not [name] case: I do not see any new cultures or inflammatory markers IMPRESSION: 1. No CT evidence for acute intra-abdominal or pelvic abnormality. Large volume of stool throughout [name] colon. 2. Enlarged fatty liver with fat sparing near [name] gallbladder fossa 3. Cortical scarring right kidney. Bilateral intrarenal stones without hydronephrosis. Thick-walled urinary bladder decompressed by suprapubic catheter 4. Deep left decubitus ulcer with underlying left ischial changes Gronau, [name] ([id number]) [phone].pdf Page 4 of 17 suggesting osteomyelitis. Her husband has been using silver collagen in [name] wound. She has not been systemically unwell no fever chills eating and drinking well. They rigorously offload this wound only getting up in [name] wheelchair when she is going to appointments [name] rest of [name] time she is in bed. 10/8; wound measures larger and she now has exposed bone. We have been using silver alginate 11/12 still using silver alginate. [name] patient saw Dr. Megan Salon of infectious disease. She was started on Augmentin 500 mg twice daily. She is due to follow-up with Dr. Megan Salon I believe next week. Lab work Dr. Megan Salon requested showed a sedimentation rate of 28 and CRP of 20 although her CRP 1 year ago was 18.8. Sedimentation rate 1 year ago was 11 basic metabolic panel showed a creatinine of 1.12 12/3; [name] patient followed up with Dr. Megan Salon yesterday. She is still on Augmentin twice daily. This was directed by Dr. Megan Salon. [name] patient's inflammatory markers have improved which is gratifying. Her C-reactive protein was repeated yesterday and follow-up booked with infectious disease in January. In addition I have been getting secure text messages I think from palliative care through [name] [employer]. I think they were hoping  to provide services to [name] patient in her home. They could not get a hold of [name] primary physician and so they reached out to me on 2 separate occasions. 12/17; patient last saw Dr. Megan Salon on 12/2. She is finishing up with Augmentin. Her C-reactive protein was 20 on 10/21, 10.1 on 11/19 and 17 on 12/2. [name] wound itself still has depth and undermining. We are using Santyl with [name] backing wet-to-dry 04/27/2019. [name] wound is gradually clearing up in terms of [name] surface although it is not filled in that much. Still abuts right against bone 2/4; patient with a deep pressure ulcer over [name] left ischial tuberosity. I thought she was going to follow-up with infectious disease to follow her inflammatory markers although [name] patient states that they stated that they did not need to see her  unless we felt it was necessary. I will need to check their notes. In any case we ordered moistened silver collagen back with wet-to-dry to fill in [name] depth of [name] wound although apparently prism sent silver alginate which they have been using since they were here [name] last time. Is obviously not what we ordered. 2/25. Not much change in this wound it is over [name] left ischial tuberosity recurrent wound. We have been using silver collagen with backing wet-to-dry. I think [name] wound is about [name] same. There is still some tunneling from about 10-12 o'clock over [name] ischial tuberosity itself 3/11; pressure ulcer over [name] left ischial tuberosity. Since she was last here [name] wound VAC was started and apparently going quite well. We are able to get [name] home health company that accepts [place] [facility] which is in itself sometimes problematic. There is been improvements in [name] wound [name] tunneling seems to be better and is contracted nicely 4/8; 1 month follow-up. Since she was last here we have been using silver collagen under a wound VAC. Some minor contraction I think in wound volume. She is cared for diligently by her husband  including pressure relief, incontinence management, nutritional support etc. 6/1; this is almost a 56-monthfollow-up. She is been using silver collagen under wound VAC. Circular area over [name] left ischial tuberosity. She has been using silver collagen under wound VAC 7/8; 1 month follow-up. Silver collagen under [name] VAC not really a lot of progress. Tissue at [name] base of [name] wound which is right against bone and [name] tissue next that this does not look completely viable. She is not currently on any antibiotics, she had underlying osteomyelitis I need to look this over 8/16; we are using silver collagen under wound VAC to [name] left ischial tuberosity wound. Comes in today with absolutely no change in surface area or depth. There is no exposed bone. I did look over her infectious disease notes as I said I would do last time. She last saw Dr. CMegan Salonin December 2020. She completed 6 weeks of Augmentin. This was in response to a bone culture I did showing methicillin susceptible staph aureus and Enterococcus. She was supposed to come back to see Dr. CMegan Salonat some point although they say that that appointment was canceled unless I chose to recommend return. I think there was supposed to be follow-up with inflammatory markers but I cannot see that that was ever done. She has not been on antibiotics since 9/21; monthly follow-up. We received a call from home health nurse last evening to report green drainage coming out of [name] wound. Lab work I ordered last time showed a white count of 5.2 a sedimentation rate of 45 and a C-reactive protein of 25 however neither one of [name] 2 values are substantially different from her previous values in October 2020 or December 2020. Both are slightly higher but only marginally. Otherwise no new complaints from [name] patient or her husband 10/19; 1 month follow-up. PCR culture I did last time showed medium quantities of Pseudomonas lower quantities Klebsiella and Enterococcus  faecalis group B strep and Peptostreptococcus. I gave her Augmentin for 2 weeks. I am not really sure of my choice of this I would not cover Pseudomonas. She is still having green drainage. Wound itself looks satisfactory there is not a lot of depth wound bed looks healthy 11/16; patient has completed [name] antibiotics still using gentamicin and silver alginate on [name] wound. There is improvement in [name] surface area  12/21; in general [name] area on [name] buttock looks somewhat better. Surface looks healthy although I do not know that there is been much improvement in [name] wound volume. We have been using silver alginate and Hydrofera Blue. Less drainage. In passing [name] husband showed me an abrasion injury on [name] left anterior tibia. Covered in necrotic surface. He has noticed this for about a week and has been putting silver collagen on it. He is completely uncertain about how this happened 1/25; monthly follow-up. [name] area on [name] left buttock is about [name] same. This does not go to bone but a fairly deep wound surface of [name] wound is of questionable viability. [name] abrasion injury that they showed me last time apparently was closed out by home health because they thought it was healed but certainly is not although it is just about healed. As a result they haven't been applying anything to this area Finally I did discuss with [name] patient and her husband [name] idea of an advanced treatment product to try and get a proper base to this wound I was thinking of Puraply however actually [name] patient points out that her co-pay for coming to visit [place] i.e. [name] facility, charge would be unaffordable if they have to, on a weekly basis 2/22; pressure ulcer on [name] left buttock appears deeper to me and abuts on [name] ischial tuberosity. I thought initially there was exposed bone but there is a rim of tissue over this area. She also has a superficial over [name] right anterior mid tibia. Been using silver collagen to these areas  without much success. I have looked over [name] patient's past history with regards to [name] area on [name] left ischium. She did have underlying osteomyelitis here dating back I think to late 2020. She saw Dr. Megan Salon she received a 6-week course of oral antibiotics in response to a bone culture that I did. This does not appear to be infected but it certainly has not been improving in terms of granulation. I do not believe she has had any recent imaging studies 3/29; 1 month follow-up. Pressure ulcer on [name] left buttock which she has been dealing with with for a number of years. She was treated for underlying osteomyelitis at 1.2 or 3 years ago I think with infectious disease help. She also had I think a flap closure by Dr. Leland Johns and that lasted for about a year and then reopened. I have not been able to get this patient to progress towards healing although truthfully [name] wound is absolutely no worse. We have been using Hydrofera Blue 4/26; patient presents for 1 week follow-up. She has been using silver collagen to [name] area every other day. She has home health that comes out once a week to help with dressing changes as well. [name] patient is interested in trying a skin substitute over this area. She states she is trying to relieve pressure off of it most of [name] day. 5/24; patient presents for 1 month follow-up. She has been using silver collagen to [name] area every other day. She has no complaints today. She is interested in [name] skin substitute. She tries to leave relieve pressure off Her bottom however is not able to most of [name] day 6/14; using silver collagen to [name] area over [name] left ischial tuberosity. Wound does not appear to be doing particularly well. Open to bone 6/28; [name] patient patient presented last time with a marked deterioration. Depth probing all [name] way to bone. [name] bone itself did  not look particularly viable. In spite of this [name] x-ray I did showed longstanding ulcer over [name] left ischial  tuberosity with chronic bone involvement/reaction that was also seen by CT in 2020. Lab work did not show just active infection with a sed rate of 14 and a C-reactive protein of 7.1. Her comprehensive metabolic panel was normal including an albumin of 4.1 white count was 6 7/19; patient was here 3 weeks ago with a marked deterioration in her wound over [name] left ischial tuberosity. I ordered a CT scan of [name] area for 1 reason or another this just did not get done. It is now booked for 8 days from now. She was supposed to come back for bone culture and pathology. That did not happen either. We have been using silver collagen. As usual she is diligently looked after by her husband 8/24; 5-week follow-up. Since [name] patient was last here [name] biopsy that I did of her ischial tuberosity came back suggesting osteomyelitis. Culture showed strep. I started her on Augmentin. She was seen by infectious disease Dr. Lucianne Lei dam and [name] Augmentin was continued and she is still taking it. CT scan did not suggest anything other than chronic osteomyelitis without much change from her previous study. Her C-reactive protein was only 7 and sedimentation rate was 1.1. We are still using silver collagen to [name] wound. She has home health. Her husband is very diligent in her care for this reason I have never pursued a diverting colostomy. She would not agree to this surgery in any case. Finally we have discussed plastic surgery with him in [name] past and she is not interested in a myocutaneous flap. 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 [name], [name] ([id number]) [id number] Page 5 of 17 Since she was last here she was in [name] ER last week with abdominal pain. She was found to be impacted however in [name] course of [name] review there somebody gave her some IV morphine and apparently she developed hives and blisters. There is still tense blisters on her plantar left fifth and fourth fingers 10/4; [name]  patient has completed her Augmentin and is due to follow-up with Dr. Drucilla Schmidt in early November. Her wound today measures about [name] same but looks a little healthier in terms of granulation there is no exposed bone. I note that she was admitted to hospital for 2 days from 9/21 through 9/23 for delirium. She had received Versed for glaucoma surgery ultimately that was felt to be [name] etiology. Her blood cultures were negative she had 30,000 colonies of Pseudomonas in her urine. She did receive broad-spectrum antibiotic therapy but ultimately her wound on [name] buttock was not felt to be [name] cause. As mentioned she has completed her antibiotics still using silver collagen 11/1; patient has completed her antibiotics for [name] underlying osteomyelitis. She apparently follows up with Dr. Drucilla Schmidt next week. [name] wound does not look too bad perhaps slightly narrower in terms of width but [name] depth is about [name] same. We have been using Hydrofera Blue. [name] patient talks to me about a wound VAC and made it sound as though she was recently on 1 although I do not see this. [name] other option is an advanced treatment product like Oasis but that means weekly trips into [name] clinic. She has previously said she does not want an attempt at plastic surgery 11/15; [name] patient saw Dr. Drucilla Schmidt noted that her follow-up inflammatory markers normalized. She has completed her antibiotics. We are currently  using Hydrofera Blue. Wound itself has some surface tissue over bone but certainly not a lot. I have looked back over her history. [name] patient has had recurrent osteomyelitis in this area as she is received prolonged courses of antibiotics. We did use a wound VAC for a prolonged period of time in 2021. We had some improvement especially in undermining areas but overall not a lot of measurable improvement. Once again I have tried to think about using advanced treatment products in this area something that would require them visiting [name]  clinic very frequently and they did not seem to want to do this. Most recently we ran [facility] however she would have a [id number] per application co-pay [id number]; left ischial tuberosity. I do not see much difference in 3 weeks. There is no exposed bone. We are using Hydrofera Blue. Our intake nurse reports some "greenish" drainage 12/21 left ischial tuberosity. Measuring slightly smaller in surface area. [name] wound still has some tissue over bone i.e. there is no exposed bone. No overt infection. We did a deep tissue culture last time for PCR. [name] major bacteria was Pseudomonas although there were medium titers of Enterococcus faecalis Klebsiella E. coli and Morganella as well as Peptostreptococcus. Keystone antibiotic include streptomycin and vancomycin they got this last weekend and used it twice 04/17/2021; no change in this wound. It does not have undermining however [name] surface is of it does not look particularly viable. We have been using topical antibiotic directed at a PCR culture as well as silver alginate. It is clear in talking to [name] patient and her husband that she does not offload this area adequately including spending all night on this with I think a level 2 surface on her bed. I have told her that this is not adequate to heal a wound like this.. 2/15; Oasis with a 0000000 co-pay per application. They are using silver alginate to [name] wound offloading as best they can according to her husband 06/19/2021: At her last visit, Dr. Dellia Nims changed her to silver collagen, but apparently they did not receive this until just last week. They have continued using silver alginate in [name] wound. Today, [name] wound is quite malodorous with necrotic slough. They ran out of [place] topical about 2 months ago. No new cultures have been taken.. 07/17/2021: At [name] last visit, [name] wound was in pretty poor condition with a lot of necrotic slough and substantial malodorous drainage. I did take a new culture for PCR, but for  some reason this was never resulted. Nonetheless, they did receive a renewal of their [employer] apparently it was communicated to them that they should use silver alginate rather than [name] silver collagen. Regardless, [name] wound is much cleaner at this visit and there is no odor. 07/26/2021: [name] sacral/left ischial ulcer is fairly clean, but there was some greenish drainage appreciated on [name] dressing. They have been using topical Keystone with silver alginate. She has unfortunately developed a new ulcer on her right ischium. It is unstageable with a thick layer of eschar. It is malodorous. 08/14/2021: I took a new PCR culture at her last visit from [name] new wound that had opened on her right ischial tuberosity. This returned with a polymicrobial population. A new topical Keystone compound was formulated. They have been using this on her wounds along with silver alginate. I also prescribed a course of oral antibiotic, Augmentin and Levaquin to try and address [name] multiple species with various resistance these that grew out. She was able to  take this for about 10 days, but it has started to make her nauseated and she is actually thrown up on occasion. Since she takes them at [name] same time, she is not sure which one is causing her issue. She has also been having a lot more drainage from her left ischial wound. Her husband says that he has not been using [name] zinc oxide as much because it is difficult to wash off when he bathes her. We have been using Santyl to [name] new wound under silver alginate and silver alginate to [name] left ischial wound. 09/11/2021: Unfortunately, there has been fairly substantial breakdown of both of her wounds. It sounds like [name] gel compound that [name] [employer] was being mixed in just leaked out everywhere and left [name] patient wet and macerated. There is worsening necrotic tissue and increased depth on [name] right ischial wound and [name] left one has extended further. They have not been using [name]  zinc oxide as recommended. They contacted [facility] and were advised to simply apply [name] powder directly to [name] wounds. 10/09/2021: [name] right ischial wound has some necrotic tissue on [name] lateral aspect and into [name] base, but no purulent drainage and no surrounding erythema or induration. On [name] left ischial wound, there is leathery nonviable skin extending laterally from [name] main wound. No significant odor or drainage from this site, either. 11/05/2021: [name] right ischial wound has more necrotic fibrinous tissue at [name] base. [name] leathery eschar on [name] left ischial wound is beginning to lift at one of [name] edges and may be amenable to debridement today. We have been using topical powdered mupirocin to her wounds with silver alginate. 12/03/2021: Significant improvement in both of [name] wounds. There is still some fibrinous slough on [name] right ischial wound. There is also a heavy layer of slough and fibrinous exudate on [name] left ischial wound as well as some soft light slough on [name] remainder of [name] wound surface. We have been using Santyl for enzymatic debridement between clinic visits. 12/31/2021: [name] right ischial wound is cleaner today with very minimal slough. [name] left ischial wound has an area of devitalized subcutaneous tissue and [name] most lateral extension of [name] wound. There is slough and biofilm on [name] surface, but overall [name] wounds are looking significantly better. We have been unable to obtain Santyl and they have just been doing wet-to-dry dressing changes with good effect. 01/30/2022: [name] left ischial wound is very clean with just a little bit of slough on [name] posterolateral aspect of [name] wound. She has some pressure induced deep tissue injury on [name] right ischial wound, but otherwise both are quite clean. 03/06/2022: [name] low-air-loss mattress was finally delivered last week. Even over [name] short period of time she has been using it, her husband thinks that [name] wounds have demonstrated an  improvement.. Certainly, since our last visit, they look better. All of [name] surfaces are robust with good granulation tissue. There is no further pressure-induced tissue damage visible. There is some slough accumulation on all of [name] wound surfaces. 04/03/2022: Both wounds appear improved this week. [name] right ischial wound is smaller and has good granulation tissue with just a little slough on [name] surface. At [name] deepest portion of [name] left ischial wound, there is still some nonviable-looking muscle, and a little bit of pressure induced deep tissue injury, but overall [name] improvement is global. [name], [name] ([id number]) [id number] Page 6 of 17 05/08/2022: [name] right ischial wound has healed. [name] left ischial wound has separated into  2 separate sites. [name] more caudal is deeper and has nonviable tissue at [name] base. [name] more cranial is more superficial, but still has some tissue at [name] lateral margin that shows signs of ongoing deep tissue pressure injury. 06/05/2022: [name] cast that has been on her right ankle was removed. 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 There is necrotic tendon present. [name] gluteal and ischial ulcers are clean but [name] intake nurse did find a fragment of bone in [name] deeper of [name] two. Electronic Signature(s) Signed: 06/05/2022 11:43:32 AM By: Fredirick Maudlin MD FACS Entered By: Fredirick Maudlin on 06/05/2022 11:43:31 -------------------------------------------------------------------------------- Physical Exam Details Patient Name: Date of Service: [name]. 06/05/2022 10:30 A M Medical Record Number: [id number] Patient Account Number: [id number] Date of Birth/Sex: Treating RN: 04/03/1951 (72 y.o. F) Primary Care Provider: [name] Other Clinician: Referring Provider: Treating Provider/Extender: Burgess Amor Weeks in Treatment: 179 Constitutional Hypertensive,  asymptomatic. . . . no acute distress. Respiratory Normal work of breathing on room air. Notes 06/05/2022: [name] cast that has been on her right ankle was removed. 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 There is necrotic tendon present. [name] gluteal and ischial ulcers are clean but [name] intake nurse did find a fragment of bone in [name] deeper of [name] two. Electronic Signature(s) Signed: 06/05/2022 11:44:52 AM By: Fredirick Maudlin MD FACS Entered By: Fredirick Maudlin on 06/05/2022 11:44:52 -------------------------------------------------------------------------------- Physician Orders Details Patient Name: Date of Service: [name], [name]. 06/05/2022 10:30 A M Medical Record Number: [id number] Patient Account Number: [id number] Date of Birth/Sex: Treating RN: 1951-01-16 (72 y.o. [name]: [name] Other Clinician: Referring Provider: Treating Provider/Extender: Nance Pew in Treatment: [phone] Verbal / Phone Orders: No Diagnosis Coding ICD-10 Coding Code Description 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, stage 4 L89.614 Pressure ulcer of right heel, stage 4 E11.622 Type 2 diabetes mellitus with other skin ulcer G82.20 Paraplegia, unspecified Follow-up Appointments Return appointment in 1 month. - Dr. Celine Ahr - Room 1 ****[facility]**** Anesthetic Wound #12 Left Ischial Tuberosity (In clinic) Topical Lidocaine 4% applied to wound bed Bathing/ Shower/ Hygiene May shower and wash wound with soap and water. - with dressing changes Off-Loading A fluidized (Group 3) mattress - Medical Modalities ir Turn and reposition every 2 hours - *** Try to shift from side to side and shift to belly (if tolerated),****avoid sitting up in bed except for meals Prevalon Boot - right foot at all times [name], [name] ([id number]) [id number] Page 7 of 17 Additional Orders /  Instructions Follow Nutritious Diet - protein shakes 2-3 times per day, Juven 2 times per day [place] wound care orders this week; continue Home Health for wound care. May utilize formulary equivalent dressing for wound treatment orders unless otherwise specified. Dressing changes to be completed by Home Health on Monday / Wednesday / Friday except when patient has scheduled visit at [facility]. - increase visits to 3 times per week (spouse having difficulty with dressing changes) Other Home Health Orders/Instructions: - [facility] Wound Treatment Wound #12 - Ischial Tuberosity Wound Laterality: Left Cleanser: Soap and Water 1 x Per Day/30 Days Discharge Instructions: May shower and wash wound with dial antibacterial soap and water prior to dressing change. Cleanser: Wound Cleanser 1 x Per Day/30 Days Discharge Instructions: Cleanse [name] wound with wound cleanser prior to applying a clean dressing  using gauze sponges, not tissue or cotton balls. Peri-Wound Care: Skin Prep (Generic) 1 x Per Day/30 Days Discharge Instructions: Use skin prep as directed Prim Dressing: Dakin's Solution 0.25%, 16 (oz) 1 x Per Day/30 Days ary Discharge Instructions: Moisten gauze with Dakin's solution Prim Dressing: Medline Woven Gauze Sponges 4x4 (in/in) 1 x Per Day/30 Days ary Discharge Instructions: moisten with saline and pack lightly into wound Secondary Dressing: Zetuvit Plus Silicone Border Dressing 5x5 (in/in) (Generic) 1 x Per Day/30 Days Discharge Instructions: Apply silicone border or ABD padover primary dressing as directed. Wound #16 - Ischium Wound Laterality: Left, Lateral Cleanser: Soap and Water 1 x Per Day/30 Days Discharge Instructions: May shower and wash wound with dial antibacterial soap and water prior to dressing change. Cleanser: Wound Cleanser 1 x Per Day/30 Days Discharge Instructions: Cleanse [name] wound with wound cleanser prior to applying a clean dressing using gauze  sponges, not tissue or cotton balls. Peri-Wound Care: Skin Prep (Generic) 1 x Per Day/30 Days Discharge Instructions: Use skin prep as directed Prim Dressing: Dakin's Solution 0.25%, 16 (oz) 1 x Per Day/30 Days ary Discharge Instructions: Moisten gauze with Dakin's solution Prim Dressing: Medline Woven Gauze Sponges 4x4 (in/in) 1 x Per Day/30 Days ary Discharge Instructions: moisten with saline and pack lightly into wound Secondary Dressing: Zetuvit Plus Silicone Border Dressing 5x5 (in/in) (Generic) 1 x Per Day/30 Days Discharge Instructions: Apply silicone border or ABD padover primary dressing as directed. Wound #17 - Achilles Wound Laterality: Right Prim Dressing: Santyl Ointment 1 x Per Day/30 Days ary Discharge Instructions: Apply nickel thick amount to wound bed as instructed Secondary Dressing: Woven Gauze Sponge, Non-Sterile 4x4 in 1 x Per Day/30 Days Discharge Instructions: Apply over primary dressing as directed. Secondary Dressing: Zetuvit Plus Silicone Border Dressing 5x5 (in/in) 1 x Per Day/30 Days Discharge Instructions: Apply silicone border over primary dressing as directed. Secured With: [name] [employer], 4.5x3.1 (in/yd) 1 x Per Day/30 Days Discharge Instructions: Secure with Kerlix as directed. Electronic Signature(s) Signed: 06/05/2022 12:28:35 PM By: Fredirick Maudlin MD FACS Entered By: Fredirick Maudlin on 06/05/2022 11:50:44 -------------------------------------------------------------------------------- Problem List Details Patient Name: Date of Service: [name], [name]. 06/05/2022 10:30 A M Medical Record Number: [id number] Patient Account Number: [id number] Date of Birth/Sex: Treating RN: [birth year] (72 y.o. [address] 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, stage 4 12/30/2018 No Yes L89.614 Pressure ulcer of right heel, stage 4 06/05/2022 No Yes E11.622 Type 2 diabetes mellitus with other skin ulcer 12/30/2018 No Yes G82.20 Paraplegia, unspecified 12/30/2018 No Yes Inactive Problems ICD-10 Code Description Active Date Inactive Date L89.313 Pressure ulcer of right buttock, stage 3 07/26/2021 07/26/2021 M86.68 Other chronic osteomyelitis, other site 02/17/2019 02/17/2019 S80.811D Abrasion, right lower leg, subsequent encounter 03/27/2020 03/27/2020 M86.68 Other chronic osteomyelitis, other site 11/28/2020 11/28/2020 L97.811 Non-pressure chronic ulcer of other part of right lower leg limited to breakdown of skin 03/27/2020 03/27/2020 Resolved Problems Electronic Signature(s) Signed: 06/05/2022 11:33:51 AM By: Fredirick Maudlin MD FACS Entered By: Fredirick Maudlin on 06/05/2022 11:33:51 -------------------------------------------------------------------------------- Progress Note Details Patient Name: Date of Service: [name]. 06/05/2022 10:30 A M Medical Record Number: [id number] Patient Account Number: [id number] Date of Birth/Sex: Treating RN: 06/16/1950 (72 y.o. F) Primary Care Provider: [name] Other Clinician: Referring  Provider: Treating Provider/Extender: Nance Pew in Treatment: 179 Subjective Chief Complaint Information obtained from Patient She is here for follow up evaluation of left ischial pressure ulcer 12/30/2018; patient comes back for review of wounds in [name] same general area over [name] previously healed left ischial pressure ulcer History of Present Illness (HPI) [name] following HPI elements were documented for [name] patient's wound: Montanye, [name] ([id number]) [id number] Page 9 of 17 Location: open ulceration of [name] left gluteal area, left heel and  right ankle for about 5 months. Quality: Patient reports No Pain. Severity: Patient states wound(s) are getting worse. Duration: Patient has had [name] wound for > 5 months prior to seeking treatment at [name] wound center Context: [name] wound occurred when [name] patient has been paraplegic for about 3 years. Modifying Factors: Wound improving due to current treatment. Associated Signs and Symptoms: Patient reports having foul odor. this 72 year old patient who is known to have hypertension, hypothyroidism, breast cancer, chronic pain syndrome, paraplegia was noted to have a left gluteal decubitus ulcer and was brought into [name]. During [name] course of her hospitalization she was debrided in [name] operating room by ankle wound. Bone cultures were taken at that time but were negative but clinically she was treated for osteomyelitis because of [name] probing down to bone and open exposed bone. Home health has been giving her antibioticss which include vancomycin and Zosyn. [name] patient was a smoker until about 3 weeks ago and used to smoke about 10 cigarettes a day for a long while. 12/13/2014 - details of her operative note from 11/03/2014 were reviewed -- PROCEDURE: 1. Excisional debridement skin, subcutaneous, muscle left ischium 35 cm2 2. Excisional debridement skin, subcutaneous tissue left heel 27 cm2 3. Excisional debridement right ankle skin, subcutaneous, bone 30 cm2 01/24/2015 -- she has some issues with her wheelchair cushion but other than that is doing very well and has received Podus boots for her feet. 02/14/2015 -- she was using her old offloading boots and this seemed to have caused her a new pressure ulcer on [name] left posterior heel near [name] superior part just below [name] Achilles tendon. 03/07/2015 -- she has a new ulceration just to [name] left of [name] midline on her sacral region more on [name] left buttock and this has been there for Dr. Leland Johns and had all [name] wounds sharply debrided. [name]  debridement was done for [name] left ischial wound, [name] left heel wound and [name] right about a week. 08/22/2015 -- was recently admitted to hospital between May 5 and 08/13/2015, with sepsis and leukocytosis due to a UTI. she was treated for a sepsis complicating Escherichia coli UTI and kidney stones. She also had metabolic and careful up at [name] secondary to pyelonephritis. He received broad-spectrum antibiotics initially and then received Macrobid as per urology. She was sent home on nitrofurantoin. during her admission she had a CT scan which showed exposed left ischial tuberosity without evidence of osteolysis. 09/12/2015-- [name] patient is having some issues with her air mattress and would like to get a opinion from medical modalities. 10/10/2015 -- [name] issue with her air mattress has not yet been sorted out and [name] new problem seems to be a lot of odor from [name] wound VAC. 11/27/2015 -- [name] patient was admitted to [name] hospital between July 23 and 10/31/2015. Her problems were sepsis, osteomyelitis of [name] pelvic bone and acute pyelonephritis. CT of [name] abdomen and pelvis was consistent with a left-sided pyelonephritis with hydronephrosis and also just showed  new sclerosis of [name] posterior portion of [name] left anterior pubic ramus suggestive of periosteal reaction consistent with osteomyelitis. She was treated for [name] osteomyelitis with infectious disease consult recommending 6 weeks of IV antibiotics including vancomycin and Rocephin and [name] antibiotics were to go on until 12/10/2015. He was seen by Dr. Iran Planas plastic surgery and Dr. Linus Salmons of infectious disease. She had a suprapubic catheter placed during [name] admission. CT scan done on 10/28/2015 showed specifically -- New sclerosis of [name] posterior portion of [name] left inferior pubic ramus with aggressive periosteal reaction, consistent with osteomyelitis, with adjacent soft tissue gas compatible with previously described decubitus ulcer. 12/12/2015 --  she was recently seen by Dr. Linus Salmons, who noted good improvement and CRP and ESR compared to before and he has stopped her antibiotic as per plans to finish on September 4. [name] patient was encouraged to continue with wound care and consider hyperbaric oxygen therapy. Today she tells me that she has consented to undergo hyperbaric oxygen therapy and we can start [name] paperwork. 01/02/2016 -- her PCP had gained about 3 years but she still persists in having problems during hyperbaric oxygen therapy with some discomfort in [name] ears. 01/09/16; pressure area with underlying osteomyelitis in [name] left buttock. Wound bed itself has some slight amount of grayish surface slough however I do not think any debridement was necessary. There is no exposed bone soft tissue appears stable. She is using a wound VAC 01/16/16; back for weekly wound review in conjunction with HBO. She has a deep wound over [name] left initial tuberosity previously treated with 6 weeks of IV antibiotics for osteomyelitis. Wound bed looks reasonably healthy although [name] base of this is still precariously close to bone. She has been using a wound VAC. 01/23/2016 -- she has completed her course of antibiotics and this week [name] only new thing is her right great toe nail was avulsed and she has got an open wound over [name] nailbed. 01/31/16 she has completed her course of antibiotics. Her right great toenail avulsed last week and she's been using silver alginate for this as well. Still using a wound VAC to [name] substantial stage IV wound over [name] left ischial tuberosity 03/05/2016 -- [name] patient has had a opinion from [name] plastic surgery group at [name] and details of this are not available yet but [name] patient's verbal report has been heard by me. Did not sound like there was any optimistic discussion regarding reconstruction and [name] net result would be to continue with [name] wound VAC application. I will await [name] official reports. Addendum: -- she  was seen at [place] surgery service by Dr. Tressa Busman. After a thorough review and from what I understand spending 45 minutes with [name] patient his assessment has been noted by me in detail and [name] management options were: 1. Continued pressure offloading and wound care versus operative procedures including wound excision 2. Soft tissue and bone sampling 3. If [name] wound gets larger wound closure would be done using a variety of plastic surgical techniques including but not limited to skin substitute, possible skin graft, local versus regional flaps, negative pressure dressing application. 4. He discussed with her details of flap surgery and [name] risks associated 5. He made a comment that since [name] patient was operated on by Dr. Leland Johns of [facility] plastic surgery unit in [place] [name] patient may continue to follow-up there for further evaluation for surgical flap closure in [name] future. 03/19/2016 -- [name] patient  continues to be rather depressed and frustrated with her lack of rapid progress in healing this wound especially because she thought after hyperbaric oxygen therapy [name] wound would heal extremely fast. She now understands that was not [name] implied benefit on wound care which was [name] recommendation for hyperbaric oxygen therapy. I have had a lengthy discussion with [name] patient and her husband regarding her options: 1. Continue with collagen and wound VAC for [name] primary dressing and offloading and all supportive care. 2. See Dr. Iran Planas for possible placement of Acell or Integra in [name] OR. 3. get a second opinion from a wound care center and surrounding regions/counties 05/07/2016 -- Note from Dr. Celedonio Miyamoto, who noted that [name] patient has declined flap surgery. She has discussed application of A cell, and try a few applications to see how [name] wound progresses. She is also recommended that we could apply products here in [name] wound center, like  Oasis. during her preop workup it was found that her hemoglobin A1c was 11% and she has now been diagnosed as having diabetes mellitus and has been put on appropriate treatment by her PCP 05/28/2016 -- tells me her blood sugars have been doing well and she has an appointment to see her PCP in [name] next couple of weeks to check her hemoglobin A1c. Other than that she continues to do well. 06/25/2016 -- have not seen her back for [name] last month but she says her health has been about [name] same and she has an appointment to check [name] A1c next week 09/10/16 ---- was seen by Dr. Celedonio Miyamoto -- who applied Acell and saw her back in follow-up. She has recommended silver alginate to [name] wound every other day and cover with foam. If no significant drainage could transition to collagen every other day. She recommended discontinuing wound VAC. There were no plans to repeat application of Acell. [name] patient expressed that her husband could do [name] wound care as going to [name] would cost several $100 for each visit. 10/21/2016 -- her insurance company is getting her new mattress and she is pleased about that. Other than that she has been doing dressings with PolyMem Silver and has been doing very well CHARNISSA, SULAK ([id number]) [id number] Page 10 of 17 02/18/2017 -- she has gone through several changes of her mattress and has not been pleased with any of them. [name] ventricles are still working on trying to fit her with [name] appropriate low air-loss mattress. She has a new wound on [name] gluteal area which is clearly separated from [name] original wound. 03/25/17-she is here in follow-up evaluation for her left ischialpressure ulcer. She remains unsatisfied with her pressure mattress. She admits to sitting multiple hours a day, in [name] bed. We have discussed offloading options. [name] wound does not appear infected. Nutrition does not appear to be a concern. Will follow-up in 4  weeks, if wound continues to be stalled may consider x-ray to evaluate for refractory osteomyelitis. 04/21/17; this is a patient that I don't know all that well. She has a chronic wound which at one point had underlying osteomyelitis in [name] left ischial tuberosity. This is a stage IV pressure ulcer. Over [name] last 3 months she has a stage II wound inferiorly to [name] original wound. [name] last time she was here her dressing was changed to silver collagen although [name] patient's husband who changes [name] dressing said that [name] collagen stuck to [name] wound and remove skin from [name] superficial area therefore he  switched back to PolyMem AG 05/13/17; this is a patient we've been following for a left ischial tuberosity wound which was stage IV at one point had underlying osteomyelitis. Over [name] last several months she's had a stage II wound just inferior and medial to [name] related to [name] wound. According to her husband he is using Endoform layer with collagen although this is not what I had last time. According to her husband they are using Elgie Congo with collagen although I don't quite know how that started. She was hospitalized from 1/20 through 04/30/16. This was related to a UTI. Her blood cultures were negative, urine culture showed multiple species. She did have a CT scan of [name] abdomen and pelvis which documented chronic osteomyelitis in [name] area of [name] wound inflammatory markers were unremarkable. She has had prior knowledge of osteomyelitis. It looks as though she received IV antibiotics in 2017 and was treated with a course of hyperbaric oxygen. 05/28/17; [name] wound over [name] left ischial tuberosity is deeper today and abuts clearly on bone. Nursing intake reported drainage. I therefore culture of [name] wound. [name] more superficial area just below this looks about [name] same. They once again complained that there are mattress cover is not working although apparently [facility] is been noted to see this many  times in [name] report is that [name] device is functional 06/18/17; [name] patient had a probing area on [name] left ischial tuberosity that was draining purulent fluid last time. This also clearly seemed to have open bone. Culture I did showed pansensitive pseudomonas including third generation cephalosporins. I treated this with cefdinir 300 twice a day for 10 days and things seem to have improved. She has a more superficial wound just underneath this area. Amazingly she has a new air mattress through [facility]. I think they gave this to her as a parking give. In any case this now works according to [name] patient may have something to do with why [name] areas are looking better. 07/09/17; [name] patient has a probing area in [name] left ischial tuberosity that still has some depth. However this is contracted in terms of [name] wound orifice although [name] depth is still roughly [name] same. There is no undermining. ooShe also has [name] satellite wound which is more superficial. This appears to have a healthy surface we've been using silver collagen 08/06/17; [name] patient's wound is over [name] left ischial tuberosity and a satellite lesion just underneath this. [name] original wound was actually a deep stage 4 wound. We have made good progress in 2 months and there is no longer exposed bone here. 09/03/17; left ischial tuberosity actually appears to be quite healthy. I think we are making progress. No debridement is required. There is no surrounding erythema 10/01/17 I follow this patient monthly for her left ischial tuberosity wound. There is 2 areas [name] original area and a satellite area. [name] satellite area looks a lot better there is no surrounding erythema. Her husband relates that he is having trouble maintaining [name] dressing. This has to do with [name] soft tissue around it. He states he puts [name] collagen in but he cannot make sure that it stays in even with [name] ABD pads and tape that he is been using 10/29/17; patient arrives  with a better looking noon today. Some of [name] satellite lesions have closed. using Prisma 11/26/17; [name] patient has a large cone-shaped area with [name] tip of [name] Cone deep within her buttock soft tissue. [name] walls of [name] Cone  are epithelialized however [name] base is still open. [name] area at [name] base of this looks moist we've been using silver collagen. Will change to silver alginate 12/31/2017; [name] wound appears to have come in fairly nicely. Using silver alginate. There is no surrounding maceration or infection 01/28/18; there is still an open area here over [name] left initial tuberosity. Base of this however looks healthy. There is no surrounding infection 02/25/18; [name] area of its open is over [name] left ischial tuberosity. [name] base of this is where [name] wound is. This is a large inverted cone-shaped area with [name] wound at [name] tip. Dimensions of [name] wound at [name] tip are improved. There is a area of denuded skin about halfway towards [name] tip which her husband thinks may have happened today when he was bathing her. 04/20/17; [name] area is still open over [name] left initial tuberosity. This is an cone shaped wound with [name] tip where [name] wound remains area there is no evidence of infection, no erythema and no purulent drainage 5/12; very fragile patient who had a chronic stage IV wound over [name] left ischial tuberosity. This is now completely closed over although it is closed over with a divot and skin over bone at [name] base of this. Continued aggressive offloading will be necessary. 12/30/2018 READMISSION This is a 72 year old woman with chronic paraplegia. I picked her up for her care from Dr. Con Memos in this clinic after he departed. She had a stage IV pressure wound over [name] left ischial tuberosity. She was treated twice for her underlying osteomyelitis and this I believe firstly in 2016 and again in 2017. There were some plans at some point for flap closure of this however she was discovered to have uncontrolled  diabetes and I do not think this was ever accomplished. She ultimately healed over in this clinic and was discharged in May. She has a large cone-shaped indentation with [name] tip of this going towards [name] left ischial tuberosity. It is not an easy area to examine but at that time I thought all of this was epithelialized. Apparently there was a reopening here shortly after she left [name] clinic last time. She was admitted to hospital at [name] end of June for Klebsiella bacteremia felt to be secondary to UTI. A CT scan of [name] pelvis is listed below and there was initially some concern that she had underlying osteomyelitis although I believe she was seen by infectious disease and that was felt to be not [name] case: I do not see any new cultures or inflammatory markers IMPRESSION: 1. No CT evidence for acute intra-abdominal or pelvic abnormality. Large volume of stool throughout [name] colon. 2. Enlarged fatty liver with fat sparing near [name] gallbladder fossa 3. Cortical scarring right kidney. Bilateral intrarenal stones without hydronephrosis. Thick-walled urinary bladder decompressed by suprapubic catheter 4. Deep left decubitus ulcer with underlying left ischial changes suggesting osteomyelitis. Her husband has been using silver collagen in [name] wound. She has not been systemically unwell no fever chills eating and drinking well. They rigorously offload this wound only getting up in [name] wheelchair when she is going to appointments [name] rest of [name] time she is in bed. 10/8; wound measures larger and she now has exposed bone. We have been using silver alginate 11/12 still using silver alginate. [name] patient saw Dr. Megan Salon of infectious disease. She was started on Augmentin 500 mg twice daily. She is due to follow-up with Dr. Megan Salon I believe next week. Lab work Dr. Megan Salon requested showed  a sedimentation rate of 28 and CRP of 20 although her CRP 1 year ago was 18.8. Sedimentation rate 1 year ago was 11  basic metabolic panel showed a creatinine of 1.12 12/3; [name] patient followed up with Dr. Megan Salon yesterday. She is still on Augmentin twice daily. This was directed by Dr. Megan Salon. [name] patient's inflammatory markers have improved which is gratifying. Her C-reactive protein was repeated yesterday and follow-up booked with infectious disease in January. In addition I have been getting secure text messages I think from palliative care through [name] [employer]. I think they were hoping to provide services to [name] patient in her home. They could not get a hold of [name] primary physician and so they reached out to me on 2 separate occasions. 12/17; patient last saw Dr. Megan Salon on 12/2. She is finishing up with Augmentin. Her C-reactive protein was 20 on 10/21, 10.1 on 11/19 and 17 on 12/2. [name] wound itself still has depth and undermining. We are using Santyl with [name] backing wet-to-dry 04/27/2019. [name] wound is gradually clearing up in terms of [name] surface although it is not filled in that much. Still abuts right against bone 2/4; patient with a deep pressure ulcer over [name] left ischial tuberosity. I thought she was going to follow-up with infectious disease to follow her inflammatory markers although [name] patient states that they stated that they did not need to see her unless we felt it was necessary. I will need to check their notes. In any case we ordered moistened silver collagen back with wet-to-dry to fill in [name] depth of [name] wound although apparently prism sent silver alginate which they have been using since they were here [name] last time. Is obviously not what we ordered. [name], [name] [id number] ([id number]) [id number] Page 11 of 17 2/25. Not much change in this wound it is over [name] left ischial tuberosity recurrent wound. We have been using silver collagen with backing wet-to-dry. I think [name] wound is about [name] same. There is still some tunneling from about  10-12 o'clock over [name] ischial tuberosity itself 3/11; pressure ulcer over [name] left ischial tuberosity. Since she was last here [name] wound VAC was started and apparently going quite well. We are able to get [name] home health company that accepts [place] [facility] which is in itself sometimes problematic. There is been improvements in [name] wound [name] tunneling seems to be better and is contracted nicely 4/8; 1 month follow-up. Since she was last here we have been using silver collagen under a wound VAC. Some minor contraction I think in wound volume. She is cared for diligently by her husband including pressure relief, incontinence management, nutritional support etc. 6/1; this is almost a 65-monthfollow-up. She is been using silver collagen under wound VAC. Circular area over [name] left ischial tuberosity. She has been using silver collagen under wound VAC 7/8; 1 month follow-up. Silver collagen under [name] VAC not really a lot of progress. Tissue at [name] base of [name] wound which is right against bone and [name] tissue next that this does not look completely viable. She is not currently on any antibiotics, she had underlying osteomyelitis I need to look this over 8/16; we are using silver collagen under wound VAC to [name] left ischial tuberosity wound. Comes in today with absolutely no change in surface area or depth. There is no exposed bone. I did look over her infectious disease notes as I said I would do last time. She last saw Dr. CMegan Salon  in December 2020. She completed 6 weeks of Augmentin. This was in response to a bone culture I did showing methicillin susceptible staph aureus and Enterococcus. She was supposed to come back to see Dr. Megan Salon at some point although they say that that appointment was canceled unless I chose to recommend return. I think there was supposed to be follow-up with inflammatory markers but I cannot see that that was ever done. She has not been on antibiotics since 9/21; monthly  follow-up. We received a call from home health nurse last evening to report green drainage coming out of [name] wound. Lab work I ordered last time showed a white count of 5.2 a sedimentation rate of 45 and a C-reactive protein of 25 however neither one of [name] 2 values are substantially different from her previous values in October 2020 or December 2020. Both are slightly higher but only marginally. Otherwise no new complaints from [name] patient or her husband 10/19; 1 month follow-up. PCR culture I did last time showed medium quantities of Pseudomonas lower quantities Klebsiella and Enterococcus faecalis group B strep and Peptostreptococcus. I gave her Augmentin for 2 weeks. I am not really sure of my choice of this I would not cover Pseudomonas. She is still having green drainage. Wound itself looks satisfactory there is not a lot of depth wound bed looks healthy 11/16; patient has completed [name] antibiotics still using gentamicin and silver alginate on [name] wound. There is improvement in [name] surface area 12/21; in general [name] area on [name] buttock looks somewhat better. Surface looks healthy although I do not know that there is been much improvement in [name] wound volume. We have been using silver alginate and Hydrofera Blue. Less drainage. In passing [name] husband showed me an abrasion injury on [name] left anterior tibia. Covered in necrotic surface. He has noticed this for about a week and has been putting silver collagen on it. He is completely uncertain about how this happened 1/25; monthly follow-up. [name] area on [name] left buttock is about [name] same. This does not go to bone but a fairly deep wound surface of [name] wound is of questionable viability. [name] abrasion injury that they showed me last time apparently was closed out by home health because they thought it was healed but certainly is not although it is just about healed. As a result they haven't been applying anything to this area Finally I did discuss  with [name] patient and her husband [name] idea of an advanced treatment product to try and get a proper base to this wound I was thinking of Puraply however actually [name] patient points out that her co-pay for coming to visit [place] i.e. [name] facility, charge would be unaffordable if they have to, on a weekly basis 2/22; pressure ulcer on [name] left buttock appears deeper to me and abuts on [name] ischial tuberosity. I thought initially there was exposed bone but there is a rim of tissue over this area. She also has a superficial over [name] right anterior mid tibia. Been using silver collagen to these areas without much success. I have looked over [name] patient's past history with regards to [name] area on [name] left ischium. She did have underlying osteomyelitis here dating back I think to late 2020. She saw Dr. Megan Salon she received a 6-week course of oral antibiotics in response to a bone culture that I did. This does not appear to be infected but it certainly has not been improving in terms of granulation. I do not  believe she has had any recent imaging studies 3/29; 1 month follow-up. Pressure ulcer on [name] left buttock which she has been dealing with with for a number of years. She was treated for underlying osteomyelitis at 1.2 or 3 years ago I think with infectious disease help. She also had I think a flap closure by Dr. Leland Johns and that lasted for about a year and then reopened. I have not been able to get this patient to progress towards healing although truthfully [name] wound is absolutely no worse. We have been using Hydrofera Blue 4/26; patient presents for 1 week follow-up. She has been using silver collagen to [name] area every other day. She has home health that comes out once a week to help with dressing changes as well. [name] patient is interested in trying a skin substitute over this area. She states she is trying to relieve pressure off of it most of [name] day. 5/24; patient presents for 1 month follow-up. She has  been using silver collagen to [name] area every other day. She has no complaints today. She is interested in [name] skin substitute. She tries to leave relieve pressure off Her bottom however is not able to most of [name] day 6/14; using silver collagen to [name] area over [name] left ischial tuberosity. Wound does not appear to be doing particularly well. Open to bone 6/28; [name] patient patient presented last time with a marked deterioration. Depth probing all [name] way to bone. [name] bone itself did not look particularly viable. In spite of this [name] x-ray I did showed longstanding ulcer over [name] left ischial tuberosity with chronic bone involvement/reaction that was also seen by CT in 2020. Lab work did not show just active infection with a sed rate of 14 and a C-reactive protein of 7.1. Her comprehensive metabolic panel was normal including an albumin of 4.1 white count was 6 7/19; patient was here 3 weeks ago with a marked deterioration in her wound over [name] left ischial tuberosity. I ordered a CT scan of [name] area for 1 reason or another this just did not get done. It is now booked for 8 days from now. She was supposed to come back for bone culture and pathology. That did not happen either. We have been using silver collagen. As usual she is diligently looked after by her husband 8/24; 5-week follow-up. Since [name] patient was last here [name] biopsy that I did of her ischial tuberosity came back suggesting osteomyelitis. Culture showed strep. I started her on Augmentin. She was seen by infectious disease Dr. Lucianne Lei dam and [name] Augmentin was continued and she is still taking it. CT scan did not suggest anything other than chronic osteomyelitis without much change from her previous study. Her C-reactive protein was only 7 and sedimentation rate was 1.1. We are still using silver collagen to [name] wound. She has home health. Her husband is very diligent in her care for this reason I have never pursued a diverting colostomy. She  would not agree to this surgery in any case. Finally we have discussed plastic surgery with him in [name] past and she is not interested in a myocutaneous flap. 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 Since she was last here she was in [name] ER last week with abdominal pain. She was found to be impacted however in [name] course of [name] review there somebody gave her some IV morphine and apparently she developed hives and blisters. There is still tense blisters on her plantar  left fifth and fourth fingers 10/4; [name] patient has completed her Augmentin and is due to follow-up with Dr. Drucilla Schmidt in early November. Her wound today measures about [name] same but looks a little healthier in terms of granulation there is no exposed bone. I note that she was admitted to hospital for 2 days from 9/21 through 9/23 for delirium. She had received Versed for glaucoma surgery ultimately that was felt to be [name] etiology. Her blood cultures were negative she had 30,000 colonies of Pseudomonas in her urine. She did receive broad-spectrum antibiotic therapy but ultimately her wound on [name] buttock was not felt to be [name] cause. As mentioned she has completed her antibiotics still using silver collagen 11/1; patient has completed her antibiotics for [name] underlying osteomyelitis. She apparently follows up with Dr. Drucilla Schmidt next week. [name] wound does not look too bad perhaps slightly narrower in terms of width but [name] depth is about [name] same. We have been using Hydrofera Blue. [name] patient talks to me about a wound VAC and made it sound as though she was recently on 1 although I do not see this. [name] other option is an advanced treatment product like Oasis but that means weekly trips into [name] clinic. She has previously said she does not want an attempt at plastic surgery 11/15; [name] patient saw Dr. Drucilla Schmidt noted that her follow-up inflammatory markers normalized. She has completed her antibiotics. We are currently using Hydrofera Blue. Wound  itself has some surface tissue over bone but certainly not a lot. [name], [name] [id number] ([id number]) [id number] Page 12 of 17 I have looked back over her history. [name] patient has had recurrent osteomyelitis in this area as she is received prolonged courses of antibiotics. We did use a wound VAC for a prolonged period of time in 2021. We had some improvement especially in undermining areas but overall not a lot of measurable improvement. Once again I have tried to think about using advanced treatment products in this area something that would require them visiting [name] clinic very frequently and they did not seem to want to do this. Most recently we ran [place] however she would have a [id number] per application co-pay [id number]; left ischial tuberosity. I do not see much difference in 3 weeks. There is no exposed bone. We are using Hydrofera Blue. Our intake nurse reports some "greenish" drainage 12/21 left ischial tuberosity. Measuring slightly smaller in surface area. [name] wound still has some tissue over bone i.e. there is no exposed bone. No overt infection. We did a deep tissue culture last time for PCR. [name] major bacteria was Pseudomonas although there were medium titers of Enterococcus faecalis Klebsiella E. coli and Morganella as well as Peptostreptococcus. Keystone antibiotic include streptomycin and vancomycin they got this last weekend and used it twice 04/17/2021; no change in this wound. It does not have undermining however [name] surface is of it does not look particularly viable. We have been using topical antibiotic directed at a PCR culture as well as silver alginate. It is clear in talking to [name] patient and her husband that she does not offload this area adequately including spending all night on this with I think a level 2 surface on her bed. I have told her that this is not adequate to heal a wound like this.. 2/15; [facility]asis with a 0000000 co-pay per application. They are using  silver alginate to [name] wound offloading as best they can according to her husband 06/19/2021: At her last visit, Dr. Dellia Nims changed  her to silver collagen, but apparently they did not receive this until just last week. They have continued using silver alginate in [name] wound. Today, [name] wound is quite malodorous with necrotic slough. They ran out of Devol topical about 2 months ago. No new cultures have been taken.. 07/17/2021: At [name] last visit, [name] wound was in pretty poor condition with a lot of necrotic slough and substantial malodorous drainage. I did take a new culture for PCR, but for some reason this was never resulted. Nonetheless, they did receive a renewal of their [employer] apparently it was communicated to them that they should use silver alginate rather than [name] silver collagen. Regardless, [name] wound is much cleaner at this visit and there is no odor. 07/26/2021: [name] sacral/left ischial ulcer is fairly clean, but there was some greenish drainage appreciated on [name] dressing. They have been using topical Keystone with silver alginate. She has unfortunately developed a new ulcer on her right ischium. It is unstageable with a thick layer of eschar. It is malodorous. 08/14/2021: I took a new PCR culture at her last visit from [name] new wound that had opened on her right ischial tuberosity. This returned with a polymicrobial population. A new topical Keystone compound was formulated. They have been using this on her wounds along with silver alginate. I also prescribed a course of oral antibiotic, Augmentin and Levaquin to try and address [name] multiple species with various resistance these that grew out. She was able to take this for about 10 days, but it has started to make her nauseated and she is actually thrown up on occasion. Since she takes them at [name] same time, she is not sure which one is causing her issue. She has also been having a lot more drainage from her left ischial wound. Her husband  says that he has not been using [name] zinc oxide as much because it is difficult to wash off when he bathes her. We have been using Santyl to [name] new wound under silver alginate and silver alginate to [name] left ischial wound. 09/11/2021: Unfortunately, there has been fairly substantial breakdown of both of her wounds. It sounds like [name] gel compound that [name] [employer] was being mixed in just leaked out everywhere and left [name] patient wet and macerated. There is worsening necrotic tissue and increased depth on [name] right ischial wound and [name] left one has extended further. They have not been using [name] zinc oxide as recommended. They contacted [facility] and were advised to simply apply [name] powder directly to [name] wounds. 10/09/2021: [name] right ischial wound has some necrotic tissue on [name] lateral aspect and into [name] base, but no purulent drainage and no surrounding erythema or induration. On [name] left ischial wound, there is leathery nonviable skin extending laterally from [name] main wound. No significant odor or drainage from this site, either. 11/05/2021: [name] right ischial wound has more necrotic fibrinous tissue at [name] base. [name] leathery eschar on [name] left ischial wound is beginning to lift at one of [name] edges and may be amenable to debridement today. We have been using topical powdered mupirocin to her wounds with silver alginate. 12/03/2021: Significant improvement in both of [name] wounds. There is still some fibrinous slough on [name] right ischial wound. There is also a heavy layer of slough and fibrinous exudate on [name] left ischial wound as well as some soft light slough on [name] remainder of [name] wound surface. We have been using Santyl for enzymatic debridement between clinic visits. 12/31/2021: [name]  right ischial wound is cleaner today with very minimal slough. [name] left ischial wound has an area of devitalized subcutaneous tissue and [name] most lateral extension of [name] wound. There is slough and biofilm on [name] surface,  but overall [name] wounds are looking significantly better. We have been unable to obtain Santyl and they have just been doing wet-to-dry dressing changes with good effect. 01/30/2022: [name] left ischial wound is very clean with just a little bit of slough on [name] posterolateral aspect of [name] wound. She has some pressure induced deep tissue injury on [name] right ischial wound, but otherwise both are quite clean. 03/06/2022: [name] low-air-loss mattress was finally delivered last week. Even over [name] short period of time she has been using it, her husband thinks that [name] wounds have demonstrated an improvement.. Certainly, since our last visit, they look better. All of [name] surfaces are robust with good granulation tissue. There is no further pressure-induced tissue damage visible. There is some slough accumulation on all of [name] wound surfaces. 04/03/2022: Both wounds appear improved this week. [name] right ischial wound is smaller and has good granulation tissue with just a little slough on [name] surface. At [name] deepest portion of [name] left ischial wound, there is still some nonviable-looking muscle, and a little bit of pressure induced deep tissue injury, but overall [name] improvement is global. 05/08/2022: [name] right ischial wound has healed. [name] left ischial wound has separated into 2 separate sites. [name] more caudal is deeper and has nonviable tissue at [name] base. [name] more cranial is more superficial, but still has some tissue at [name] lateral margin that shows signs of ongoing deep tissue pressure injury. 06/05/2022: [name] cast that has been on her right ankle was removed. 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 There is necrotic tendon present. [name] gluteal and ischial ulcers are clean but [name] intake nurse did find a fragment of bone in [name] deeper of [name] two. Patient History Information obtained from Patient. Family History Unknown History, Cancer - Mother, Diabetes - Maternal  Grandparents, Heart Disease - Mother, Hypertension - Mother, Thyroid Problems - Mother, No family history of Hereditary Spherocytosis, Kidney Disease, Lung Disease, Seizures, Stroke, Tuberculosis. Social History Former smoker - ended on 11/06/2014, Marital Status - Married, Alcohol Use - Never, Drug Use - No History, Caffeine Use - Daily - T soda. ea, Medical History Eyes Denies history of Cataracts, Glaucoma, Optic Neuritis Dawes, [name] ([id number]) [phone].pdf Page 13 of 17 Ear/Nose/Mouth/Throat Denies history of Chronic sinus problems/congestion, Middle ear problems Hematologic/Lymphatic Patient has history of Anemia Denies history of Hemophilia, Human Immunodeficiency Virus, Lymphedema, Sickle Cell Disease Respiratory Denies history of Aspiration, Asthma, Chronic Obstructive Pulmonary Disease (COPD), Pneumothorax, Sleep Apnea, Tuberculosis Cardiovascular Patient has history of Hypertension Denies history of Angina, Arrhythmia, Congestive Heart Failure, Coronary Artery Disease, Deep Vein Thrombosis, Hypotension, Myocardial Infarction, Peripheral Arterial Disease, Peripheral Venous Disease, Phlebitis, Vasculitis Gastrointestinal Denies history of Cirrhosis , Colitis, Crohnoos, Hepatitis A, Hepatitis B, Hepatitis C Endocrine Patient has history of Type II Diabetes Denies history of Type I Diabetes Genitourinary Denies history of End Stage Renal Disease Immunological Denies history of Lupus Erythematosus, Raynaudoos, Scleroderma Integumentary (Skin) Denies history of History of Burn Musculoskeletal Patient has history of Osteoarthritis Denies history of Gout, Rheumatoid Arthritis, Osteomyelitis Neurologic Patient has history of Dementia, Paraplegia Denies history of Neuropathy, Quadriplegia Oncologic Patient has history of Received Radiation - 2012 - right breast Psychiatric Denies history of Anorexia/bulimia, Confinement  Anxiety Hospitalization/Surgery History -  left knee replacement. - right breast lumpectomy. - cervical laminectomy with fusion. - hysterectomy. - tear duct surgery. - T and A. - UTI. - suprapubic cath placement. - UTI. Medical A Surgical History Notes nd Gastrointestinal constipation GERD Genitourinary UTI kidney stones neurogenic bladder Integumentary (Skin) left gluteal fold sacral Musculoskeletal cervical neuropathy osteomyelitis Psychiatric admitted for suicide risk on 12/02/2018 Objective Constitutional Hypertensive, asymptomatic. no acute distress. Vitals Time Taken: 10:55 AM, Height: 63 in, Weight: 185 lbs, BMI: 32.8, Temperature: 97.5 F, Pulse: 87 bpm, Respiratory Rate: 20 breaths/min, Blood Pressure: 160/78 mmHg. Respiratory Normal work of breathing on room air. General Notes: 06/05/2022: [name] cast that has been on her right ankle was removed. 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 There is necrotic tendon present. [name] gluteal and ischial ulcers are clean but [name] intake nurse did find a fragment of bone in [name] deeper of [name] two. Integumentary (Hair, Skin) Wound #12 status is Open. Original cause of wound was Pressure Injury. [name] date acquired was: 09/06/2018. [name] wound has been in treatment 179 weeks. [name] wound is located on [name] Left Ischial Tuberosity. [name] wound measures 3.8cm length x 2cm width x 0.6cm depth; 5.969cm^2 area and 3.581cm^3 volume. There is Fat Layer (Subcutaneous Tissue) exposed. There is no tunneling or undermining noted. There is a medium amount of purulent drainage noted. [name] wound margin is well defined and not attached to [name] wound base. There is large (67-100%) red granulation within [name] wound bed. There is a small (1-33%) amount of necrotic tissue within [name] wound bed including Adherent Slough. [name] periwound skin appearance had no abnormalities noted for color. [name] periwound skin appearance exhibited:  Scarring, Dry/Scaly. [name] periwound skin appearance did not exhibit: Callus, Crepitus, Excoriation, Induration, Rash, Maceration. Periwound temperature was noted as No Abnormality. Wound #16 status is Open. Original cause of wound was Pressure Injury. [name] date acquired was: 05/08/2022. [name] wound has been in treatment 4 weeks. [name] wound is located on [name] Left,Lateral Ischium. [name] wound measures 1.4cm length x 2.5cm width x 3.9cm depth; 2.749cm^2 area and 10.721cm^3 volume. There is Fat Layer (Subcutaneous Tissue) exposed. There is undermining starting at 10:00 and ending at 12:00 with a maximum distance of 6.1cm. There is a medium amount of purulent drainage noted. [name] wound margin is distinct with [name] outline attached to [name] wound base. There is large (67-100%) red granulation within [name] wound bed. There is a small (1-33%) amount of necrotic tissue within [name] wound bed including Adherent Slough. [name] periwound skin appearance had no abnormalities noted for moisture. [name] periwound skin appearance had no abnormalities noted for color. [name] periwound skin appearance exhibited: Scarring. Periwound temperature was noted as No Abnormality. MARNETTE, HAMEISTER [id number] ([id number]) [id number] Page 14 of 17 Wound #17 status is Open. Original cause of wound was Pressure Injury. [name] date acquired was: 05/22/2022. [name] wound is located on [name] Right Achilles. [name] wound measures 3.1cm length x 4cm width x 0.6cm depth; 9.739cm^2 area and 5.843cm^3 volume. There is Fat Layer (Subcutaneous Tissue) exposed. There is no tunneling or undermining noted. There is a medium amount of serosanguineous drainage noted. There is medium (34-66%) red, hyper - granulation within [name] wound bed. There is a medium (34-66%) amount of necrotic tissue within [name] wound bed including Eschar and Adherent Slough. [name] periwound skin appearance exhibited: Scarring, Dry/Scaly. Periwound temperature was noted as No Abnormality. Wound  #17 status is Open. Original cause of wound was Pressure Injury. [name] date acquired was:  05/22/2022. [name] wound is located on [name] Right Achilles. [name] wound measures 3.1cm length x 4cm width x 0.6cm depth; 9.739cm^2 area and 5.843cm^3 volume. There is tendon and Fat Layer (Subcutaneous Tissue) exposed. There is no tunneling or undermining noted. There is a medium amount of serosanguineous drainage noted. [name] wound margin is distinct with [name] outline attached to [name] wound base. There is medium (34-66%) red, friable granulation within [name] wound bed. There is a medium (34-66%) amount of necrotic tissue within [name] wound bed including Eschar and Adherent Slough. [name] periwound skin appearance had no abnormalities noted for texture. [name] periwound skin appearance had no abnormalities noted for color. [name] periwound skin appearance exhibited: Dry/Scaly. Periwound temperature was noted as No Abnormality. Assessment Active Problems ICD-10 Pressure ulcer of left buttock, stage 4 Pressure ulcer of right heel, stage 4 Type 2 diabetes mellitus with other skin ulcer Paraplegia, unspecified Procedures Wound #17 Pre-procedure diagnosis of Wound #17 is a Pressure Ulcer located on [name] Right Achilles . There was a Excisional Skin/Subcutaneous Tissue/Muscle Debridement with a total area of 12.4 sq cm performed by Fredirick Maudlin, MD. With [name] following instrument(s): Curette, Forceps, and Scissors to remove Viable and Non-Viable tissue/material. Material removed includes Eschar, T endon, and Subcutaneous Tissue after achieving pain control using Lidocaine 4% Topical Solution. No specimens were taken. A time out was conducted at 11:20, prior to [name] start of [name] procedure. A Minimum amount of bleeding was controlled with Pressure. [name] procedure was tolerated well with a pain level of 0 throughout and a pain level of 0 following [name] procedure. Post Debridement Measurements: 3.1cm length x 4cm width x 0.6cm depth; 5.843cm^3  volume. Post debridement Stage noted as Category/Stage IV. Character of Wound/Ulcer Post Debridement is improved. Post procedure Diagnosis Wound #17: Same as Pre-Procedure General Notes: scribed by Baruch Gouty, RN for Dr. Celine Ahr. Plan Follow-up Appointments: Return appointment in 1 month. - Dr. Celine Ahr - Room 1 ****HOYER**** Anesthetic: Wound #12 Left Ischial Tuberosity: (In clinic) Topical Lidocaine 4% applied to wound bed Bathing/ Shower/ Hygiene: May shower and wash wound with soap and water. - with dressing changes Off-Loading: Air fluidized (Group 3) mattress - Medical Modalities Turn and reposition every 2 hours - *** Try to shift from side to side and shift to belly (if tolerated),****avoid sitting up in bed except for meals Prevalon Boot - right foot at all times Additional Orders / Instructions: Follow Nutritious Diet - protein shakes 2-3 times per day, Juven 2 times per day Home Health: New wound care orders this week; continue Home Health for wound care. May utilize formulary equivalent dressing for wound treatment orders unless otherwise specified. Dressing changes to be completed by Normanna on Monday / Wednesday / Friday except when patient has scheduled visit at [facility]. - increase visits to 3 times per week (spouse having difficulty with dressing changes) Other Home Health Orders/Instructions: - [facility] WOUND #12: - Ischial Tuberosity Wound Laterality: Left Cleanser: Soap and Water 1 x Per Day/30 Days Discharge Instructions: May shower and wash wound with dial antibacterial soap and water prior to dressing change. Cleanser: Wound Cleanser 1 x Per Day/30 Days Discharge Instructions: Cleanse [name] wound with wound cleanser prior to applying a clean dressing using gauze sponges, not tissue or cotton balls. Peri-Wound Care: Skin Prep (Generic) 1 x Per Day/30 Days Discharge Instructions: Use skin prep as directed Prim Dressing: Dakin's Solution 0.25%, 16 (oz)  1 x Per Day/30 Days ary Discharge Instructions: Moisten gauze with Dakin's solution  Prim Dressing: Medline Woven Gauze Sponges 4x4 (in/in) 1 x Per Day/30 Days ary Discharge Instructions: moisten with saline and pack lightly into wound Secondary Dressing: Zetuvit Plus Silicone Border Dressing 5x5 (in/in) (Generic) 1 x Per Day/30 Days Discharge Instructions: Apply silicone border or ABD padover primary dressing as directed. WOUND #16: - Ischium Wound Laterality: Left, Lateral Cleanser: Soap and Water 1 x Per Day/30 Days Discharge Instructions: May shower and wash wound with dial antibacterial soap and water prior to dressing change. Cleanser: Wound Cleanser 1 x Per Day/30 Days Discharge Instructions: Cleanse [name] wound with wound cleanser prior to applying a clean dressing using gauze sponges, not tissue or cotton balls. [name], THEBERGE [id number] ([id number]) [id number] Page 15 of 17 Peri-Wound Care: Skin Prep (Generic) 1 x Per Day/30 Days Discharge Instructions: Use skin prep as directed Prim Dressing: Dakin's Solution 0.25%, 16 (oz) 1 x Per Day/30 Days ary Discharge Instructions: Moisten gauze with Dakin's solution Prim Dressing: Medline Woven Gauze Sponges 4x4 (in/in) 1 x Per Day/30 Days ary Discharge Instructions: moisten with saline and pack lightly into wound Secondary Dressing: Zetuvit Plus Silicone Border Dressing 5x5 (in/in) (Generic) 1 x Per Day/30 Days Discharge Instructions: Apply silicone border or ABD padover primary dressing as directed. WOUND #17: - Achilles Wound Laterality: Right Prim Dressing: Santyl Ointment 1 x Per Day/30 Days ary Discharge Instructions: Apply nickel thick amount to wound bed as instructed Secondary Dressing: Woven Gauze Sponge, Non-Sterile 4x4 in 1 x Per Day/30 Days Discharge Instructions: Apply over primary dressing as directed. Secondary Dressing: Zetuvit Plus Silicone Border Dressing 5x5 (in/in) 1 x Per Day/30 Days Discharge  Instructions: Apply silicone border over primary dressing as directed. Secured With: [name] [employer], 4.5x3.1 (in/yd) 1 x Per Day/30 Days Discharge Instructions: Secure with Kerlix as directed. 06/05/2022: [name] cast that has been on her right ankle was removed. 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 There is necrotic tendon present. [name] gluteal and ischial ulcers are clean but [name] intake nurse did find a fragment of bone in [name] deeper of [name] two. [name] gluteal and ischial ulcers did not require debridement. We are going to pack these wounds with Dakin's moistened Kerlix. I used a combination of scissors and forceps as well as a curette to debride necrotic tendon and nonviable subcutaneous tissue from [name] right heel ulcer. This wound would benefit from ongoing enzymatic debridement so we will apply Santyl to this site with moistened gauze, Kerlix and Ace bandaging. She does have a Prevalon boot and she should wear this at all times to avoid further tissue damage. She continues on a level 3 sleeping surface. Follow-up in 1 month. Electronic Signature(s) Signed: 06/05/2022 11:53:42 AM By: Fredirick Maudlin MD FACS Entered By: Fredirick Maudlin on 06/05/2022 11:53:42 -------------------------------------------------------------------------------- HxROS Details Patient Name: Date of Service: [name], [name]. 06/05/2022 10:30 A M Medical Record Number: [id number] Patient Account Number: [id number] Date of Birth/Sex: Treating RN: 07/28/1950 (72 y.o. F) Primary Care Provider: [name] Other Clinician: Referring Provider: Treating Provider/Extender: Nance Pew in Treatment: 36 Information Obtained From Patient Eyes Medical History: Negative for: Cataracts; Glaucoma; Optic Neuritis Ear/Nose/Mouth/Throat Medical History: Negative for: Chronic sinus problems/congestion; Middle ear problems Hematologic/Lymphatic Medical  History: Positive for: Anemia Negative for: Hemophilia; Human Immunodeficiency Virus; Lymphedema; Sickle Cell Disease Respiratory Medical History: Negative for: Aspiration; Asthma; Chronic Obstructive Pulmonary Disease (COPD); Pneumothorax; Sleep Apnea; Tuberculosis Cardiovascular Medical History: Positive for: Hypertension Negative for: Angina; Arrhythmia; Congestive Heart Failure;  Coronary Artery Disease; Deep Vein Thrombosis; Hypotension; Myocardial Infarction; Peripheral Arterial Disease; Peripheral Venous Disease; Phlebitis; Vasculitis Gastrointestinal Medical HistoryLOUIZA, HUWE ([id number]) [id number] Page 16 of 17 Negative for: Cirrhosis ; Colitis; Crohns; Hepatitis A; Hepatitis B; Hepatitis C Past Medical History Notes: constipation GERD Endocrine Medical History: Positive for: Type II Diabetes Negative for: Type I Diabetes Time with diabetes: 1 year Treated with: Oral agents Blood sugar tested every day: No Genitourinary Medical History: Negative for: End Stage Renal Disease Past Medical History Notes: UTI kidney stones neurogenic bladder Immunological Medical History: Negative for: Lupus Erythematosus; Raynauds; Scleroderma Integumentary (Skin) Medical History: Negative for: History of Burn Past Medical History Notes: left gluteal fold sacral Musculoskeletal Medical History: Positive for: Osteoarthritis Negative for: Gout; Rheumatoid Arthritis; Osteomyelitis Past Medical History Notes: cervical neuropathy osteomyelitis Neurologic Medical History: Positive for: Dementia; Paraplegia Negative for: Neuropathy; Quadriplegia Oncologic Medical History: Positive for: Received Radiation - 2012 - right breast Psychiatric Medical History: Negative for: Anorexia/bulimia; Confinement Anxiety Past Medical History Notes: admitted for suicide risk on 12/02/2018 Immunizations Pneumococcal Vaccine: Received Pneumococcal  Vaccination: Yes Received Pneumococcal Vaccination On or After 60th Birthday: No Implantable Devices None Hospitalization / Surgery History Type of Hospitalization/Surgery left knee replacement right breast lumpectomy cervical laminectomy with fusion hysterectomy tear duct surgery Tand A Calleros, [name] ([id number] Page 17 of 17 UTI suprapubic cath placement UTI Family and Social History Unknown History: Yes; Cancer: Yes - Mother; Diabetes: Yes - Maternal Grandparents; Heart Disease: Yes - Mother; Hereditary Spherocytosis: No; Hypertension: Yes - Mother; Kidney Disease: No; Lung Disease: No; Seizures: No; Stroke: No; Thyroid Problems: Yes - Mother; Tuberculosis: No; Former smoker - ended on 11/06/2014; Marital Status - Married; Alcohol Use: Never; Drug Use: No History; Caffeine Use: Daily - T soda; Financial Concerns: No; ea, Food, Clothing or Shelter Needs: No; Support System Lacking: No; Transportation Concerns: No Electronic Signature(s) Signed: 06/05/2022 12:28:35 PM By: Fredirick Maudlin MD FACS Entered By: Fredirick Maudlin on 06/05/2022 11:44:18 -------------------------------------------------------------------------------- SuperBill Details Patient Name: Date of Service: [name] 06/05/2022 Medical Record Number: [id number] Patient Account Number: [id number] Date of Birth/Sex: Treating RN: [birth year] (72 y.o. F) Primary Care Provider: [name] Other Clinician: Referring Provider: Treating Provider/Extender: Nance Pew in Treatment: 179 Diagnosis Coding ICD-10 Codes Code Description [phone] Pressure ulcer of left buttock, stage 4 G82.20 Paraplegia, unspecified L89.614 Pressure ulcer of right heel, stage 4 E11.622 Type 2 diabetes mellitus with other skin ulcer Facility Procedures : [name] patient participates with Medicare or their insurance follows [name] Medicare Facility Guidelines: CPT4 Code  Description Modifier Quantity [id number] - DEB MUSC/FASCIA 20 SQ CM/< 1 ICD-10 Diagnosis Description L89.614 Pressure ulcer of right heel,  stage 4 Physician Procedures : CPT4 Code Description Modifier [id number] - WC PHYS LEVEL 4 - EST PT 25 ICD-10 Diagnosis Description 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, stage 4 L89.614 Pressure ulcer of right heel, stage 4 G82.20 Paraplegia, unspecified E11.622 Type 2 diabetes  mellitus with other skin ulcer Quantity: 1 : [id number] 11043 - WC PHYS DEBR MUSCLE/FASCIA 20 SQ CM ICD-10 Diagnosis Description L89.614 Pressure ulcer of right heel, stage 4 Quantity: 1 Electronic Signature(s) Signed: 06/05/2022 11:54:01 AM By: Fredirick Maudlin MD FACS Entered By: Fredirick Maudlin on 06/05/2022 11:54:00

## 2022-06-11 NOTE — Progress Notes (Signed)
DANNIELYNN, RENGEL [place] ([id number]) [id number] Page 1 of 11 Visit Report for 06/05/2022 Arrival Information Details Patient Name: Date of Service: KISSIE, DANT. 06/05/2022 10:30 A M Medical Record Number: [id number] Patient Account Number: [id number] Date of Birth/Sex: Treating RN: [birth year] (72 y.o. Martyn Malay, Linda Primary Care Courtni Balash: [name] Other Clinician: Referring Treesa Mccully: Treating Massey Ruhland/Extender: Nance Pew in Treatment: 44 Visit Information History Since Last Visit Added or deleted any medications: No Patient Arrived: Wheel Chair Any new allergies or adverse reactions: No Arrival Time: 10:55 Had a fall or experienced change in No Accompanied By: spouse activities of daily living that may affect Transfer Assistance: Harrel Lemon Lift risk of falls: Patient Identification Verified: Yes Signs or symptoms of abuse/neglect since last visito No Patient Requires Transmission-Based Precautions: No Hospitalized since last visit: No Patient Has Alerts: No Implantable device outside of the clinic excluding No cellular tissue based products placed in the center since last visit: Has Dressing in Place as Prescribed: Yes Pain Present Now: Yes Electronic Signature(s) Signed: 06/05/2022 4:59:17 PM By: Baruch Gouty RN, BSN Entered By: Baruch Gouty on 06/05/2022 10:56:18 -------------------------------------------------------------------------------- Encounter Discharge Information Details Patient Name: Date of Service: [name] 06/05/2022 10:30 A M Medical Record Number: [id number] Patient Account Number: [id number] Date of Birth/Sex: Treating RN: [birth year] (72 y.o. [name] Primary Care Kerry Chisolm: [name] Other Clinician: Referring Terence Googe: Treating Taray Normoyle/Extender: Nance Pew in Treatment: 179 Encounter Discharge Information Items Post Procedure Vitals Discharge Condition:  Stable Temperature (F): 97.5 Ambulatory Status: Wheelchair Pulse (bpm): 87 Discharge Destination: Home Respiratory Rate (breaths/min): 18 Transportation: Private Auto Blood Pressure (mmHg): 160/78 Accompanied By: spouse Schedule Follow-up Appointment: Yes Clinical Summary of Care: Patient Declined Electronic Signature(s) Signed: 06/05/2022 4:59:17 PM By: Baruch Gouty RN, BSN Entered By: Baruch Gouty on 06/05/2022 16:37:02 -------------------------------------------------------------------------------- Lower Extremity Assessment Details Patient Name: Date of Service: [name] 06/05/2022 10:30 A M Medical Record Number: [id number] Patient Account Number: [id number] Date of Birth/Sex: Treating RN: 05/31/1950 (72 y.o. [name] Primary Care Sofya Moustafa: [name] Other Clinician: Referring Schylar Wuebker: Treating Zaylan Kissoon/Extender: Nance Pew in Treatment: 179 Vascular Assessment Pulses: [name] ([id number]) [id number].pdf Page 2 of 11] Dorsalis Pedis Palpable: [Right:Yes] Blood Pressure: Brachial: [Right:160] Ankle: [Right:Posterior Tibial: 148 0.93] Electronic Signature(s) Signed: 06/05/2022 4:59:17 PM By: Baruch Gouty RN, BSN Entered By: Baruch Gouty on 06/05/2022 11:09:54 -------------------------------------------------------------------------------- Multi Wound Chart Details Patient Name: Date of Service: [name], Molli Posey M. 06/05/2022 10:30 A M Medical Record Number: [id number] Patient Account Number: [id number] Date of Birth/Sex: Treating RN: [birth year] (72 y.o. F) Primary Care Krish Bailly: [name] Other Clinician: Referring Earsie Humm: Treating Anorah Trias/Extender: Nance Pew in Treatment: 179 Vital Signs Height(in): 4 Pulse(bpm): 32 Weight(lbs): 185 Blood Pressure(mmHg): 160/78 Body Mass Index(BMI): 32.8 Temperature(F): 97.5 Respiratory Rate(breaths/min):  20 [12:Photos:] [17:No Photos] Left Ischial Tuberosity Left, Lateral Ischium Right Achilles Wound Location: Pressure Injury Pressure Injury Pressure Injury Wounding Event: Pressure Ulcer Pressure Ulcer Pressure Ulcer Primary Etiology: Anemia, Hypertension, Type II Anemia, Hypertension, Type II Anemia, Hypertension, Type II Comorbid History: Diabetes, Osteoarthritis, Dementia,Diabetes, Osteoarthritis, Dementia, Diabetes, Osteoarthritis, Dementia, Paraplegia, Received Radiation Paraplegia, Received Radiation Paraplegia, Received Radiation 09/06/2018 05/08/2022 05/22/2022 Date Acquired: 179 4 0 Weeks of Treatment: Open Open Open Wound Status: No No No Wound Recurrence: 3.8x2x0.6 1.4x2.5x3.9 3.1x4x0.6 Measurements L x W x D (cm) 5.969 2.749 9.739 A (cm) : rea 3.581 10.721 5.843 Volume (cm) : -2429.20% 20.80% N/A %  Reduction in A rea: -1589.20% 31.40% N/A % Reduction in Volume: 10 Starting Position 1 (o'clock): 12 Ending Position 1 (o'clock): 6.1 Maximum Distance 1 (cm): No Yes No Undermining: Category/Stage IV Category/Stage III Category/Stage IV Classification: Medium Medium Medium Exudate A mount: Purulent Purulent Serosanguineous Exudate Type: yellow, brown, green yellow, brown, green red, brown Exudate Color: Well defined, not attached Distinct, outline attached N/A Wound Margin: Large (67-100%) Large (67-100%) Medium (34-66%) Granulation A mount: Red Red Red, Hyper-granulation Granulation Quality: Small (1-33%) Small (1-33%) Medium (34-66%) Necrotic A mount: Adherent [place] Eschar, Adherent Slough Necrotic Tissue: Fat Layer (Subcutaneous Tissue): Yes Fat Layer (Subcutaneous Tissue): Yes Fat Layer (Subcutaneous Tissue): Yes Exposed Structures: Fascia: No Fascia: No Fascia: No Tendon: No Tendon: No Tendon: No Muscle: No Muscle: No Muscle: No Joint: No Joint: No Joint: No Bone: No Bone: No Bone: No Small (1-33%) Small (1-33%) Small  (1-33%) Epithelialization: N/A N/A Debridement - Excisional Debridement: Pre-procedure Verification/Time Out N/A N/A 11:20 Mcgeachy, [name] ([id number].pdf Page 3 of 11 Taken: N/A N/A Lidocaine 4% Topical Solution Pain Control: N/A N/A Necrotic/Eschar, Tendon, Tissue Debrided: Subcutaneous N/A N/A Skin/Subcutaneous Tissue/Muscle Level: N/A N/A 12.4 Debridement A (sq cm): rea N/A N/A Curette, Forceps, Scissors Instrument: N/A N/A Minimum Bleeding: N/A N/A Pressure Hemostasis Achieved: N/A N/A 0 Procedural Pain: N/A N/A 0 Post Procedural Pain: N/A N/A Procedure was tolerated well Debridement Treatment Response: N/A N/A 3.1x4x0.6 Post Debridement Measurements L x W x D (cm) N/A N/A 5.843 Post Debridement Volume: (cm) N/A N/A Category/Stage IV Post Debridement Stage: Scarring: Yes Scarring: Yes Scarring: Yes Periwound Skin Texture: Excoriation: No Induration: No Callus: No Crepitus: No Rash: No Dry/Scaly: Yes No Abnormalities Noted Dry/Scaly: Yes Periwound Skin Moisture: Maceration: No Atrophie Blanche: No No Abnormalities Noted Periwound Skin Color: Cyanosis: No Ecchymosis: No Erythema: No Hemosiderin Staining: No Mottled: No Pallor: No Rubor: No No Abnormality No Abnormality No Abnormality Temperature: N/A N/A Debridement Procedures Performed: Wound Number: 17 N/A N/A Photos: No Photos N/A N/A Right Achilles N/A N/A Wound Location: Pressure Injury N/A N/A Wounding Event: Pressure Ulcer N/A N/A Primary Etiology: Anemia, Hypertension, Type II N/A N/A Comorbid History: Diabetes, Osteoarthritis, Dementia, Paraplegia, Received Radiation 05/22/2022 N/A N/A Date Acquired: 0 N/A N/A Weeks of Treatment: Open N/A N/A Wound Status: No N/A N/A Wound Recurrence: 3.1x4x0.6 N/A N/A Measurements L x W x D (cm) 9.739 N/A N/A A (cm) : rea 5.843 N/A N/A Volume (cm) : N/A N/A N/A % Reduction in A rea: N/A N/A  N/A % Reduction in Volume: No N/A N/A Undermining: Category/Stage IV N/A N/A Classification: Medium N/A N/A Exudate A mount: Serosanguineous N/A N/A Exudate Type: red, brown N/A N/A Exudate Color: Distinct, outline attached N/A N/A Wound Margin: Medium (34-66%) N/A N/A Granulation A mount: Red, Friable N/A N/A Granulation Quality: Medium (34-66%) N/A N/A Necrotic A mount: Eschar, Adherent Slough N/A N/A Necrotic Tissue: Fat Layer (Subcutaneous Tissue): Yes N/A N/A Exposed Structures: Tendon: Yes Fascia: No Muscle: No Joint: No Bone: No Small (1-33%) N/A N/A Epithelialization: Debridement - Excisional N/A N/A Debridement: Pre-procedure Verification/Time Out 11:20 N/A N/A Taken: Lidocaine 4% Topical Solution N/A N/A Pain Control: Necrotic/Eschar, Tendon, N/A N/A Tissue Debrided: Subcutaneous Skin/Subcutaneous Tissue/Muscle N/A N/A Level: 12.4 N/A N/A Debridement A (sq cm): rea Curette, Forceps, Scissors N/A N/A Instrument: Minimum N/A N/A Bleeding: Pressure N/A N/A Hemostasis Achieved: 0 N/A N/A Procedural Pain: 0 N/A N/A Post Procedural Pain: Debridement Treatment Response: Procedure was tolerated well N/A N/A Post Debridement Measurements L  x 3.1x4x0.6 N/A N/A W x D (cm) 5.843 N/A N/A Post Debridement Volume: (cm) Rosch, [name] ([id number] Page 4 of 11 Category/Stage IV N/A N/A Post Debridement Stage: Scarring: Yes N/A N/A Periwound Skin Texture: Dry/Scaly: Yes N/A N/A Periwound Skin Moisture: No Abnormalities Noted N/A N/A Periwound Skin Color: No Abnormality N/A N/A Temperature: Debridement N/A N/A Procedures Performed: Treatment Notes Electronic Signature(s) Signed: 06/05/2022 11:35:57 AM By: Fredirick Maudlin MD FACS Entered By: Fredirick Maudlin on 06/05/2022 11:35:57 -------------------------------------------------------------------------------- Multi-Disciplinary Care Plan Details Patient  Name: Date of Service: [name], Molli Posey M. 06/05/2022 10:30 A M Medical Record Number: [id number] Patient Account Number: [id number] Date of Birth/Sex: Treating RN: November 11, 1950 (72 y.o. [name] Primary Care Sheela Mcculley: [name] Other Clinician: Referring Ashea Winiarski: Treating Kathee Tumlin/Extender: Nance Pew in Treatment: [place] reviewed with physician Active Inactive Pressure Nursing Diagnoses: Knowledge deficit related to management of pressures ulcers Potential for impaired tissue integrity related to pressure, friction, moisture, and shear Goals: Patient/caregiver will verbalize understanding of pressure ulcer management Date Initiated: 05/22/2021 Target Resolution Date: 07/04/2022 Goal Status: Active Interventions: Assess: immobility, friction, shearing, incontinence upon admission and as needed Assess offloading mechanisms upon admission and as needed Notes: Wound/Skin Impairment Nursing Diagnoses: Knowledge deficit related to ulceration/compromised skin integrity Goals: Patient/caregiver will verbalize understanding of skin care regimen Date Initiated: 12/30/2018 Target Resolution Date: 07/04/2022 Goal Status: Active Interventions: Assess patient/caregiver ability to obtain necessary supplies Assess patient/caregiver ability to perform ulcer/skin care regimen upon admission and as needed Assess ulceration(s) every visit Provide education on ulcer and skin care Treatment Activities: Skin care regimen initiated : 12/30/2018 Topical wound management initiated : 12/30/2018 Notes: 01/08/21: Wound care regimen continues Electronic Signature(s) Signed: 06/05/2022 4:59:17 PM By: Baruch Gouty RN, BSN Entered By: Baruch Gouty on 06/05/2022 11:15:56 Held, [name] ([id number] Page 5 of 11 -------------------------------------------------------------------------------- Pain Assessment  Details Patient Name: Date of Service: SIGNA, LEMPKA 06/05/2022 10:30 A M Medical Record Number: [id number] Patient Account Number: [id number] Date of Birth/Sex: Treating RN: September 27, 1950 (72 y.o. [name] Primary Care Sederick Jacobsen: [name] Other Clinician: Referring Analiyah Lechuga: Treating Derisha Funderburke/Extender: Nance Pew in Treatment: 179 Active Problems Location of Pain Severity and Description of Pain Patient Has Paino Yes Site Locations Pain Location: Generalized Pain With Dressing Change: No Rate the pain. Current Pain Level: 4 Worst Pain Level: 10 Least Pain Level: 2 Tolerable Pain Level: 4 Character of Pain Describe the Pain: Difficult to Pinpoint Pain Management and Medication Current Pain Management: Medication: Yes Cold Application: No Rest: Yes Massage: No Activity: No T.E.N.S.: No Heat Application: No Leg drop or elevation: No Is the Current Pain Management Adequate: Adequate How does your wound impact your activities of daily livingo Sleep: No Bathing: No Appetite: No Relationship With Others: No Bladder Continence: No Emotions: No Bowel Continence: No Work: No Toileting: No Drive: No Dressing: No Hobbies: No Electronic Signature(s) Signed: 06/05/2022 4:59:17 PM By: Baruch Gouty RN, BSN Entered By: Baruch Gouty on 06/05/2022 10:57:34 -------------------------------------------------------------------------------- Patient/Caregiver Education Details Patient Name: Date of Service: [name] 2/29/2024andnbsp10:30 A M Medical Record Number: [id number] Patient Account Number: [id number] Date of Birth/Gender: Treating RN: 07/17/1950 (72 y.o. [name] Primary Care Physician: [name] Other Clinician: Referring Physician: Treating Physician/Extender: Nance Pew in Treatment: 179 Education Assessment Education Provided To: [name] ([id number])  [id number] Page 6 of 11 Patient Education Topics Provided Pressure: Methods: Explain/Verbal Responses: Reinforcements needed,  State content correctly Wound/Skin Impairment: Methods: Explain/Verbal Responses: Reinforcements needed, State content correctly Electronic Signature(s) Signed: 06/05/2022 4:59:17 PM By: Baruch Gouty RN, BSN Entered By: Baruch Gouty on 06/05/2022 11:16:26 -------------------------------------------------------------------------------- Wound Assessment Details Patient Name: Date of Service: [name] 06/05/2022 10:30 A M Medical Record Number: [id number] Patient Account Number: [id number] Date of Birth/Sex: Treating RN: 18-Jun-1950 (72 y.o. Martyn Malay, Vaughan Basta Primary Care Lakeitha Basques: [name] Other Clinician: Referring Gamal Todisco: Treating Sanai Frick/Extender: Burgess Amor Weeks in Treatment: 179 Wound Status Wound Number: 12 Primary Pressure Ulcer Etiology: Wound Location: Left Ischial Tuberosity Wound Open Wounding Event: Pressure Injury Status: Date Acquired: 09/06/2018 Comorbid Anemia, Hypertension, Type II Diabetes, Osteoarthritis, Weeks Of Treatment: 179 History: Dementia, Paraplegia, Received Radiation Clustered Wound: No Photos Wound Measurements Length: (cm) 3.8 Width: (cm) 2 Depth: (cm) 0.6 Area: (cm) 5.969 Volume: (cm) 3.581 % Reduction in Area: -2429.2% % Reduction in Volume: -1589.2% Epithelialization: Small (1-33%) Tunneling: No Undermining: No Wound Description Classification: Category/Stage IV Wound Margin: Well defined, not attached Exudate Amount: Medium Exudate Type: Purulent Exudate Color: yellow, brown, green Foul Odor After Cleansing: No Slough/Fibrino Yes Wound Bed Granulation Amount: Large (67-100%) Exposed Structure Granulation Quality: Red Fascia Exposed: No Necrotic Amount: Small (1-33%) Fat Layer (Subcutaneous Tissue) Exposed: Yes Necrotic Quality: Adherent  Slough Tendon Exposed: No Muscle Exposed: No Joint Exposed: No Bone Exposed: No [place], [name] ([id number].pdf Page 7 of 11 Periwound Skin Texture Texture Color No Abnormalities Noted: No No Abnormalities Noted: Yes Callus: No Temperature / Pain Crepitus: No Temperature: No Abnormality Excoriation: No Induration: No Rash: No Scarring: Yes Moisture No Abnormalities Noted: No Dry / Scaly: Yes Maceration: No Treatment Notes Wound #12 (Ischial Tuberosity) Wound Laterality: Left Cleanser Soap and Water Discharge Instruction: May shower and wash wound with dial antibacterial soap and water prior to dressing change. Wound Cleanser Discharge Instruction: Cleanse the wound with wound cleanser prior to applying a clean dressing using gauze sponges, not tissue or cotton balls. Peri-Wound Care Skin Prep Discharge Instruction: Use skin prep as directed Topical Primary Dressing Dakin's Solution 0.25%, 16 (oz) Discharge Instruction: Moisten gauze with Dakin's solution Medline Woven Gauze Sponges 4x4 (in/in) Discharge Instruction: moisten with saline and pack lightly into wound Secondary Dressing Zetuvit Plus Silicone Border Dressing 5x5 (in/in) Discharge Instruction: Apply silicone border or ABD padover primary dressing as directed. Secured With Compression Wrap Compression Stockings Environmental education officer) Signed: 06/05/2022 4:53:10 PM By: Dellie Catholic RN Signed: 06/05/2022 4:59:17 PM By: Baruch Gouty RN, BSN Entered By: Dellie Catholic on 06/05/2022 11:12:44 -------------------------------------------------------------------------------- Wound Assessment Details Patient Name: Date of Service: [name] 06/05/2022 10:30 A M Medical Record Number: [id number] Patient Account Number: [id number] Date of Birth/Sex: Treating RN: Sep 05, 1950 (72 y.o. [name] Primary Care Lanaiya Lantry: [name] Other  Clinician: Referring Romell Wolden: Treating Casi Westerfeld/Extender: Burgess Amor Weeks in Treatment: 179 Wound Status Wound Number: 16 Primary Pressure Ulcer Etiology: Wound Location: Left, Lateral Ischium Wound Open Wounding Event: Pressure Injury Status: Date Acquired: 05/08/2022 Comorbid Anemia, Hypertension, Type II Diabetes, Osteoarthritis, Weeks Of Treatment: 4 History: Dementia, Paraplegia, Received Radiation Clustered Wound: No Photos Berggren, [name] ([id number] Page 8 of 11 Wound Measurements Length: (cm) 1.4 Width: (cm) 2.5 Depth: (cm) 3.9 Area: (cm) 2.749 Volume: (cm) 10.721 % Reduction in Area: 20.8% % Reduction in Volume: 31.4% Epithelialization: Small (1-33%) Undermining: Yes Starting Position (o'clock): 10 Ending Position (o'clock): 12 Maximum Distance: (cm) 6.1 Wound Description Classification: Category/Stage III Wound Margin: Distinct, outline attached  Exudate Amount: Medium Exudate Type: Purulent Exudate Color: yellow, brown, green Foul Odor After Cleansing: No Slough/Fibrino Yes Wound Bed Granulation Amount: Large (67-100%) Exposed Structure Granulation Quality: Red Fascia Exposed: No Necrotic Amount: Small (1-33%) Fat Layer (Subcutaneous Tissue) Exposed: Yes Necrotic Quality: Adherent Slough Tendon Exposed: No Muscle Exposed: No Joint Exposed: No Bone Exposed: No Periwound Skin Texture Texture Color No Abnormalities Noted: No No Abnormalities Noted: Yes Scarring: Yes Temperature / Pain Temperature: No Abnormality Moisture No Abnormalities Noted: Yes Treatment Notes Wound #16 (Ischium) Wound Laterality: Left, Lateral Cleanser Soap and Water Discharge Instruction: May shower and wash wound with dial antibacterial soap and water prior to dressing change. Wound Cleanser Discharge Instruction: Cleanse the wound with wound cleanser prior to applying a clean dressing using gauze sponges, not  tissue or cotton balls. Peri-Wound Care Skin Prep Discharge Instruction: Use skin prep as directed Topical Primary Dressing Dakin's Solution 0.25%, 16 (oz) Discharge Instruction: Moisten gauze with Dakin's solution Medline Woven Gauze Sponges 4x4 (in/in) Discharge Instruction: moisten with saline and pack lightly into wound Secondary Dressing Zetuvit Plus Silicone Border Dressing 5x5 (in/in) Discharge Instruction: Apply silicone border or ABD padover primary dressing as directed. Secured With [place], [name] ([id number]) [id number] Page 9 of 11 Compression Wrap Compression Stockings Add-Ons Electronic Signature(s) Signed: 06/05/2022 4:53:10 PM By: Dellie Catholic RN Signed: 06/05/2022 4:59:17 PM By: Baruch Gouty RN, BSN Entered By: Dellie Catholic on 06/05/2022 11:13:12 -------------------------------------------------------------------------------- Wound Assessment Details Patient Name: Date of Service: [name] 06/05/2022 10:30 A M Medical Record Number: [id number] Patient Account Number: [id number] Date of Birth/Sex: Treating RN: Oct 16, 1950 (72 y.o. Martyn Malay, Vaughan Basta Primary Care Raymonde Hamblin: [name] Other Clinician: Referring Rickiya Picariello: Treating Kamin Niblack/Extender: Burgess Amor Weeks in Treatment: 179 Wound Status Wound Number: 17 Primary Pressure Ulcer Etiology: Wound Location: Right Achilles Wound Open Wounding Event: Pressure Injury Status: Date Acquired: 05/22/2022 Comorbid Anemia, Hypertension, Type II Diabetes, Osteoarthritis, Weeks Of Treatment: 0 History: Dementia, Paraplegia, Received Radiation Clustered Wound: No Wound Measurements Length: (cm) 3.1 Width: (cm) 4 Depth: (cm) 0.6 Area: (cm) 9.739 Volume: (cm) 5.843 % Reduction in Area: % Reduction in Volume: Epithelialization: Small (1-33%) Tunneling: No Undermining: No Wound Description Classification: Category/Stage IV Exudate Amount:  Medium Exudate Type: Serosanguineous Exudate Color: red, brown Foul Odor After Cleansing: No Slough/Fibrino Yes Wound Bed Granulation Amount: Medium (34-66%) Exposed Structure Granulation Quality: Red, Hyper-granulation Fascia Exposed: No Necrotic Amount: Medium (34-66%) Fat Layer (Subcutaneous Tissue) Exposed: Yes Necrotic Quality: Eschar, Adherent Slough Tendon Exposed: No Muscle Exposed: No Joint Exposed: No Bone Exposed: No Periwound Skin Texture Texture Color No Abnormalities Noted: No No Abnormalities Noted: No Scarring: Yes Temperature / Pain Temperature: No Abnormality Moisture No Abnormalities Noted: No Dry / Scaly: Yes Electronic Signature(s) Signed: 06/05/2022 4:59:17 PM By: Baruch Gouty RN, BSN Entered By: Baruch Gouty on 06/05/2022 11:11:27 -------------------------------------------------------------------------------- Wound Assessment Details Patient Name: Date of Service: [name] 06/05/2022 10:30 A M Medical Record Number: [id number] Patient Account Number: [id number] Date of Birth/Sex: Treating RN: 07-28-1950 (72 y.o. [address] [place], [place] ([id number]) [id number] Page 10 of 11 Primary Care Sakari Raisanen: [name] Other Clinician: Referring Chanze Teagle: Treating Denaya Horn/Extender: Burgess Amor Weeks in Treatment: 179 Wound Status Wound Number: 17 Primary Pressure Ulcer Etiology: Wound Location: Right Achilles Wound Open Wounding Event: Pressure Injury Status: Date Acquired: 05/22/2022 Comorbid Anemia, Hypertension, Type II Diabetes, Osteoarthritis, Weeks Of Treatment: 0 History: Dementia, Paraplegia, Received Radiation Clustered Wound: No Wound Measurements Length: (cm) 3.1 Width: (  cm) 4 Depth: (cm) 0.6 Area: (cm) 9.739 Volume: (cm) 5.843 % Reduction in Area: % Reduction in Volume: Epithelialization: Small (1-33%) Tunneling: No Undermining: No Wound  Description Classification: Category/Stage IV Wound Margin: Distinct, outline attached Exudate Amount: Medium Exudate Type: Serosanguineous Exudate Color: red, brown Foul Odor After Cleansing: No Slough/Fibrino Yes Wound Bed Granulation Amount: Medium (34-66%) Exposed Structure Granulation Quality: Red, Friable Fascia Exposed: No Necrotic Amount: Medium (34-66%) Fat Layer (Subcutaneous Tissue) Exposed: Yes Necrotic Quality: Eschar, Adherent Slough Tendon Exposed: Yes Muscle Exposed: No Joint Exposed: No Bone Exposed: No Periwound Skin Texture Texture Color No Abnormalities Noted: Yes No Abnormalities Noted: Yes Moisture Temperature / Pain No Abnormalities Noted: No Temperature: No Abnormality Dry / Scaly: Yes Treatment Notes Wound #17 (Achilles) Wound Laterality: Right Cleanser Peri-Wound Care Topical Primary Dressing Santyl Ointment Discharge Instruction: Apply nickel thick amount to wound bed as instructed Secondary Dressing Woven Gauze Sponge, Non-Sterile 4x4 in Discharge Instruction: Apply over primary dressing as directed. Zetuvit Plus Silicone Border Dressing 5x5 (in/in) Discharge Instruction: Apply silicone border over primary dressing as directed. Secured With [employer], 4.5x3.1 (in/yd) Discharge Instruction: Secure with Kerlix as directed. Compression Wrap Compression Stockings Add-Ons Electronic Signature(s) Signed: 06/05/2022 4:59:17 PM By: Baruch Gouty RN, BSN [place], Sloan M ([id number]) By: Baruch Gouty RN, BSN [phone].pdf Page 11 of 11 Signed: 06/05/2022 4:59:17 PM Entered By: Baruch Gouty on 06/05/2022 11:24:49 -------------------------------------------------------------------------------- Vitals Details Patient Name: Date of Service: MALEEYAH, SPRITZER 06/05/2022 10:30 A M Medical Record Number: [id number] Patient Account Number: [id number] Date of Birth/Sex: Treating RN: [birth year] (72 y.o. Martyn Malay, Vaughan Basta Primary Care Karalina Tift: [name] Other Clinician: Referring Kileigh Ortmann: Treating Kurtiss Wence/Extender: Nance Pew in Treatment: 179 Vital Signs Time Taken: 10:55 Temperature (F): 97.5 Height (in): 63 Pulse (bpm): 87 Weight (lbs): 185 Respiratory Rate (breaths/min): 20 Body Mass Index (BMI): 32.8 Blood Pressure (mmHg): 160/78 Reference Range: 80 - 120 mg / dl Electronic Signature(s) Signed: 06/05/2022 4:59:17 PM By: Baruch Gouty RN, BSN Entered By: Baruch Gouty on 06/05/2022 10:57:00

## 2022-06-12 ENCOUNTER — Ambulatory Visit: Payer: Medicare Other | Admitting: Podiatry

## 2022-06-12 DIAGNOSIS — L97511 Non-pressure chronic ulcer of other part of right foot limited to breakdown of skin: Secondary | ICD-10-CM

## 2022-06-12 DIAGNOSIS — S82891D Other fracture of right lower leg, subsequent encounter for closed fracture with routine healing: Secondary | ICD-10-CM | POA: Diagnosis not present

## 2022-06-12 DIAGNOSIS — L97312 Non-pressure chronic ulcer of right ankle with fat layer exposed: Secondary | ICD-10-CM | POA: Diagnosis not present

## 2022-06-14 ENCOUNTER — Emergency Department (HOSPITAL_COMMUNITY)
Admission: EM | Admit: 2022-06-14 | Discharge: 2022-07-07 | Disposition: E | Payer: Medicare Other | Attending: Emergency Medicine | Admitting: Emergency Medicine

## 2022-06-14 DIAGNOSIS — E039 Hypothyroidism, unspecified: Secondary | ICD-10-CM | POA: Diagnosis not present

## 2022-06-14 DIAGNOSIS — I10 Essential (primary) hypertension: Secondary | ICD-10-CM | POA: Diagnosis not present

## 2022-06-14 DIAGNOSIS — I469 Cardiac arrest, cause unspecified: Secondary | ICD-10-CM | POA: Diagnosis present

## 2022-06-14 DIAGNOSIS — Z79899 Other long term (current) drug therapy: Secondary | ICD-10-CM | POA: Diagnosis not present

## 2022-06-14 DIAGNOSIS — Z7984 Long term (current) use of oral hypoglycemic drugs: Secondary | ICD-10-CM | POA: Insufficient documentation

## 2022-06-14 DIAGNOSIS — E119 Type 2 diabetes mellitus without complications: Secondary | ICD-10-CM | POA: Insufficient documentation

## 2022-06-15 NOTE — Progress Notes (Signed)
Subjective: Chief Complaint  Patient presents with   2 Week Follow-up    Right foot and ankle, patient stated she is doing pretty good, n drainage, no pain      72 year old female presents the office with her husband for follow-up evaluation of right ankle ulcer, fracture.  States that she is doing well and she has been going to wound care center for other issues but they have evaluated the foot and she has been applying Santyl which seems to be helping.  States that the wound on the lateral aspect ankle is healed and she only has the 1 on the back of the heel.  She does not report any fevers or chills she has no other concerns today.   Objective: AAO x3, NAD DP/PT pulses palpable bilaterally, CRT less than 3 seconds Right ankle: Ulceration is healed on the lateral aspect ankle as well as on the dorsal foot only 1 small area remains.  On the posterior aspect on the Achilles tendon there is separating her wound without any probing to bone, and or tunneling.  There is no exposed tendon.  There is no surrounding erythema, ascending cellulitis.  There is no fluctuance or crepitation there is no malodor.  There is no obvious signs of infection today. No pain with calf compression, swelling, warmth, erythema  Assessment: Right ankle, foot ulceration, ankle fracture  Plan: -All treatment options discussed with the patient including all alternatives, risks, complications.  -Overall the wounds are doing better.  Will defer to the wound care center at this time for treatment.  Continue offloading. -Monitor for any clinical signs or symptoms of infection and directed to call the office immediately should any occur or go to the ER.  Return in about 4 weeks (around 07/10/2022) for ankle fracture, ulcer- x-ray.  Trula Slade DPM

## 2022-07-03 ENCOUNTER — Ambulatory Visit (HOSPITAL_BASED_OUTPATIENT_CLINIC_OR_DEPARTMENT_OTHER): Payer: Medicare Other | Admitting: General Surgery

## 2022-07-07 NOTE — Progress Notes (Signed)
   Jul 05, 2022 1915  Spiritual Encounters  Type of Visit Initial  Care provided to: Family  Conversation partners present during encounter Nurse;Physician  Referral source Nurse (RN/NT/LPN)  Reason for visit Patient death   Chaplain Jorene Guest responded to the page to provide grief and emotional support to the patient's family in consultation room A. Two MD's were providing status of the patient to the patient's husband, [name] and daughter, [name]. [name] shared he met the patient when she was 84 yrs old and married at 72 years old and they would have celebrated their 60th wedding anniversary Oct 2024. Escorted family to room, later the patient's son-in-law, [name] arrived. Prayer offered. [name] was given the Patient Placement card. Family expressed appreciation of chaplain's support and requested to be spend time alone.This note was prepared by Jeanine Luz, M.Div..  For questions please contact by phone [phone].

## 2022-07-07 NOTE — ED Notes (Addendum)
1855- pt transferred to stretcher/ pulse check- pt in vfib/ pt shocked at 200[address]ules  1856- CPR started  1858- '300mg'$  of amio given and 1 epi  1900- pulse check- no pulse- CPR started  1900- 2G of magnesium given by Hassan Rowan, [place]- calcium given  1900-06-24- pulse check/ no pulse/ CPR continued/ pt in vfib- pt shocked at [place] given/ [place]- pulse check/ vfib/ pt shocked at [place]- no pulse- cpr started  1907- pulse check- asystole  06/24/05- Time of death called by Dr. Reather Converse

## 2022-07-07 NOTE — ED Triage Notes (Signed)
Pt had a witnessed arrest at home by husband. Pt has been having diarrhea and vomiting since this morning. Pt took imodium, vomited and collapsed. CPR started. CPR for a total of 55 minutes before arriving to ED. ROSC achieved once during the 55 minutes of CPR. Pt arrived in PEA and CPR started again. Pt received 1 shock and 6 epis with EMS.

## 2022-07-07 NOTE — ED Notes (Signed)
Chaplain paged  

## 2022-07-07 NOTE — ED Provider Notes (Signed)
[facility] Provider Note   CSN: [id number] Arrival date & time: 06-30-2022  [id number]     History  No chief complaint on file.   [name] is a 72 y.o. female.  This is a 72 year old female with history of hypertension, hypothyroidism, type 2 diabetes, kidney disease, and paraplegia presenting to the emergency department in cardiac arrest.  She was reportedly in her normal state of health this morning, this afternoon had an episode of chest pain with vomiting.  Was reportedly fine after that, had an additional episode of chest pain with multiple episodes of vomiting.  Husband checked out on her and asked her if she wanted to call EMS.  She replied no, she then wanted a fish sandwich so her husband was about to leave to grab or a fish sandwich when he checked on her again and she was unresponsive.  He started CPR, EMS was called who arrived and continued CPR.  After approximately 45 minutes of CPR ROSC was achieved.  Patient was transported to the emergency department however they lost pulses prior to arrival and CPR was initiated again.  He received multiple rounds of epinephrine with EMS and was brought in to the emergency department with active CPR in progress.        Home Medications Prior to Admission medications   Medication Sig Start Date End Date Taking? Authorizing Provider  baclofen (LIORESAL) 10 MG tablet Take 10 mg by mouth 4 (four) times daily as needed for muscle spasms. 11/21/21   [provider]  bisacodyl (DULCOLAX) 10 MG suppository Place 1 suppository (10 mg total) rectally daily as needed for severe constipation. 12/17/21   Gaylan Gerold, DO  buprenorphine (BUTRANS) 5 MCG/HR PTWK 1 patch once a week. 04/02/22   [provider]  carvedilol (COREG) 12.5 MG tablet Take 0.5 tablets (6.25 mg total) by mouth 2 (two) times daily with a meal. 01/28/22   Little Ishikawa, MD  collagenase (SANTYL) 250 UNIT/GM  ointment Apply 1 Application topically daily.    [provider]  doxycycline (VIBRA-TABS) 100 MG tablet Take 1 tablet (100 mg total) by mouth 2 (two) times daily. 02/25/22   Trula Slade, DPM  DULoxetine (CYMBALTA) 60 MG capsule Take 60 mg by mouth in the morning. 11/21/21   [provider]  folic acid (FOLVITE) 1 MG tablet Take 1 mg by mouth in the morning.    [provider]  furosemide (LASIX) 40 MG tablet Take 40 mg by mouth in the morning.    [provider]  gentamicin ointment (GARAMYCIN) 0.1 % Apply topically. 03/18/22   [provider]  HYDROcodone-acetaminophen (NORCO/VICODIN) 5-325 MG tablet Take 1 tablet by mouth every 6 (six) hours as needed. 03/10/22   [provider]  levothyroxine (SYNTHROID, LEVOTHROID) 125 MCG tablet Take 1 tablet (125 mcg total) by mouth daily before breakfast. 11/07/14   Francesca Oman, DO  metFORMIN (GLUCOPHAGE) 1000 MG tablet Take 1,000 mg by mouth 2 (two) times daily. 02/01/19   [provider]  metoprolol tartrate (LOPRESSOR) 100 MG tablet Take 100 mg by mouth 2 (two) times daily. 03/27/22   [provider]  Multiple Vitamins-Minerals (ZINC PO) Take 1 tablet by mouth in the morning.    [provider]  NIFEdipine (ADALAT CC) 30 MG 24 hr tablet Take 30 mg by mouth at bedtime. 11/25/21   [provider]  oxyCODONE-acetaminophen (PERCOCET/ROXICET) 5-325 MG tablet Take 1-2 tablets by  mouth every 4 (four) hours as needed for severe pain.    [provider]  polyethylene glycol powder (GLYCOLAX/MIRALAX) 17 GM/SCOOP powder Take 17 g by mouth daily as needed (constipation).    [provider]  pregabalin (LYRICA) 100 MG capsule Take 100 mg by mouth 2 (two) times daily. 12/09/21   [provider]  senna (SENOKOT) 8.6 MG TABS tablet Take 8.6 mg by mouth 2 (two) times daily as needed (constipation).    [provider]      Allergies    Ivp dye  [iodinated contrast media], Aspirin, Morphine and related, and Sulfa antibiotics    Review of Systems   Review of Systems  Reason unable to perform ROS: cardiac arrest.    Physical Exam Updated Vital Signs There were no vitals taken for this visit. Physical Exam Constitutional:      Appearance: She is toxic-appearing.  HENT:     Head: Normocephalic.  Neck:     Comments: She has notable kyphosis Cardiovascular:     Comments: Without pulses, no cardiac activity Pulmonary:     Comments: Patient has Igel in place and is receiving BVM respirations Abdominal:     General: There is no distension.     Palpations: Abdomen is soft.  Musculoskeletal:     Right lower leg: No edema.     Left lower leg: No edema.  Skin:    Comments: Extremities cool  Neurological:     Comments: Unresponsive     ED Results / Procedures / Treatments   Labs (all labs ordered are listed, but only abnormal results are displayed) Labs Reviewed - No data to display  EKG None  Radiology No results found.  Procedures Procedures    Medications Ordered in ED Medications - No data to display  ED Course/ Medical Decision Making/ A&P                             Medical Decision Making Patient presents in cardiac arrest receiving active CPR.  We transferred patient to the trauma stretcher under continuous CPR.  We then did a pulse check which showed patient was in ventricular fibrillation.  We continued CPR, delivered a defibrillation at 200 J, and continued CPR again.  Patient given multiple rounds of epinephrine, we also gave patient calcium, magnesium and amiodarone.  We gave her a bolus of 300 mg followed by bolus of 150 mg.  Patient received a total of 2 rounds of defibrillation due to ventricular fibrillation, her last 2 pulse checks were asystole and PEA.    She underwent about 10 minutes of additional CPR and 2 defibrillations followed by multiple medications, as last pulse check was found to be  asystolic.  She has received a total of 65 minutes of CPR and ALS interventions with EMS and in our emergency department.  At this time we decided further attempts at resuscitation were futile and likelihood of meaningful neurologic outcome was poor considering her age and multiple comorbidities as well as the amount of time she has been in cardiac arrest. Time of death was called at 91.  Myself and Dr. Reather Converse updated patient's family in the consult room, chaplain also came to provide grief and emotional support.  I called and discussed this case with the medical examiner who after reviewing her chart and all of her information decided this is not an ME case.  I called and discussed this patient's case with  her primary care office who will fill out her death certificate.  Problems Addressed: Cardiac arrest [facility]): acute illness or injury that poses a threat to life or bodily functions         Final Clinical Impression(s) / ED Diagnoses Final diagnoses:  None    Rx / DC Orders ED Discharge Orders     None         Jimmie Molly, MD 2022-06-16 2158    Elnora Morrison, MD 06/16/22 [phone]

## 2022-07-07 NOTE — ED Notes (Signed)
Family at bedside , EDP spoke with family . Post Mortem care rendered , will transport to morgue .

## 2022-07-07 DEATH — deceased

## 2022-07-10 ENCOUNTER — Ambulatory Visit: Payer: Medicare Other | Admitting: Podiatry

## 2023-05-06 IMAGING — CR DG PELVIS 1-2V
1 series · 1 of 1 positions shown · non-contrast
Comparison: 09/25/2018

CLINICAL DATA: Nonhealing pressure ulcer, question bone involving

EXAM:
PELVIS - 1-2 VIEW

[t pelvis ap]
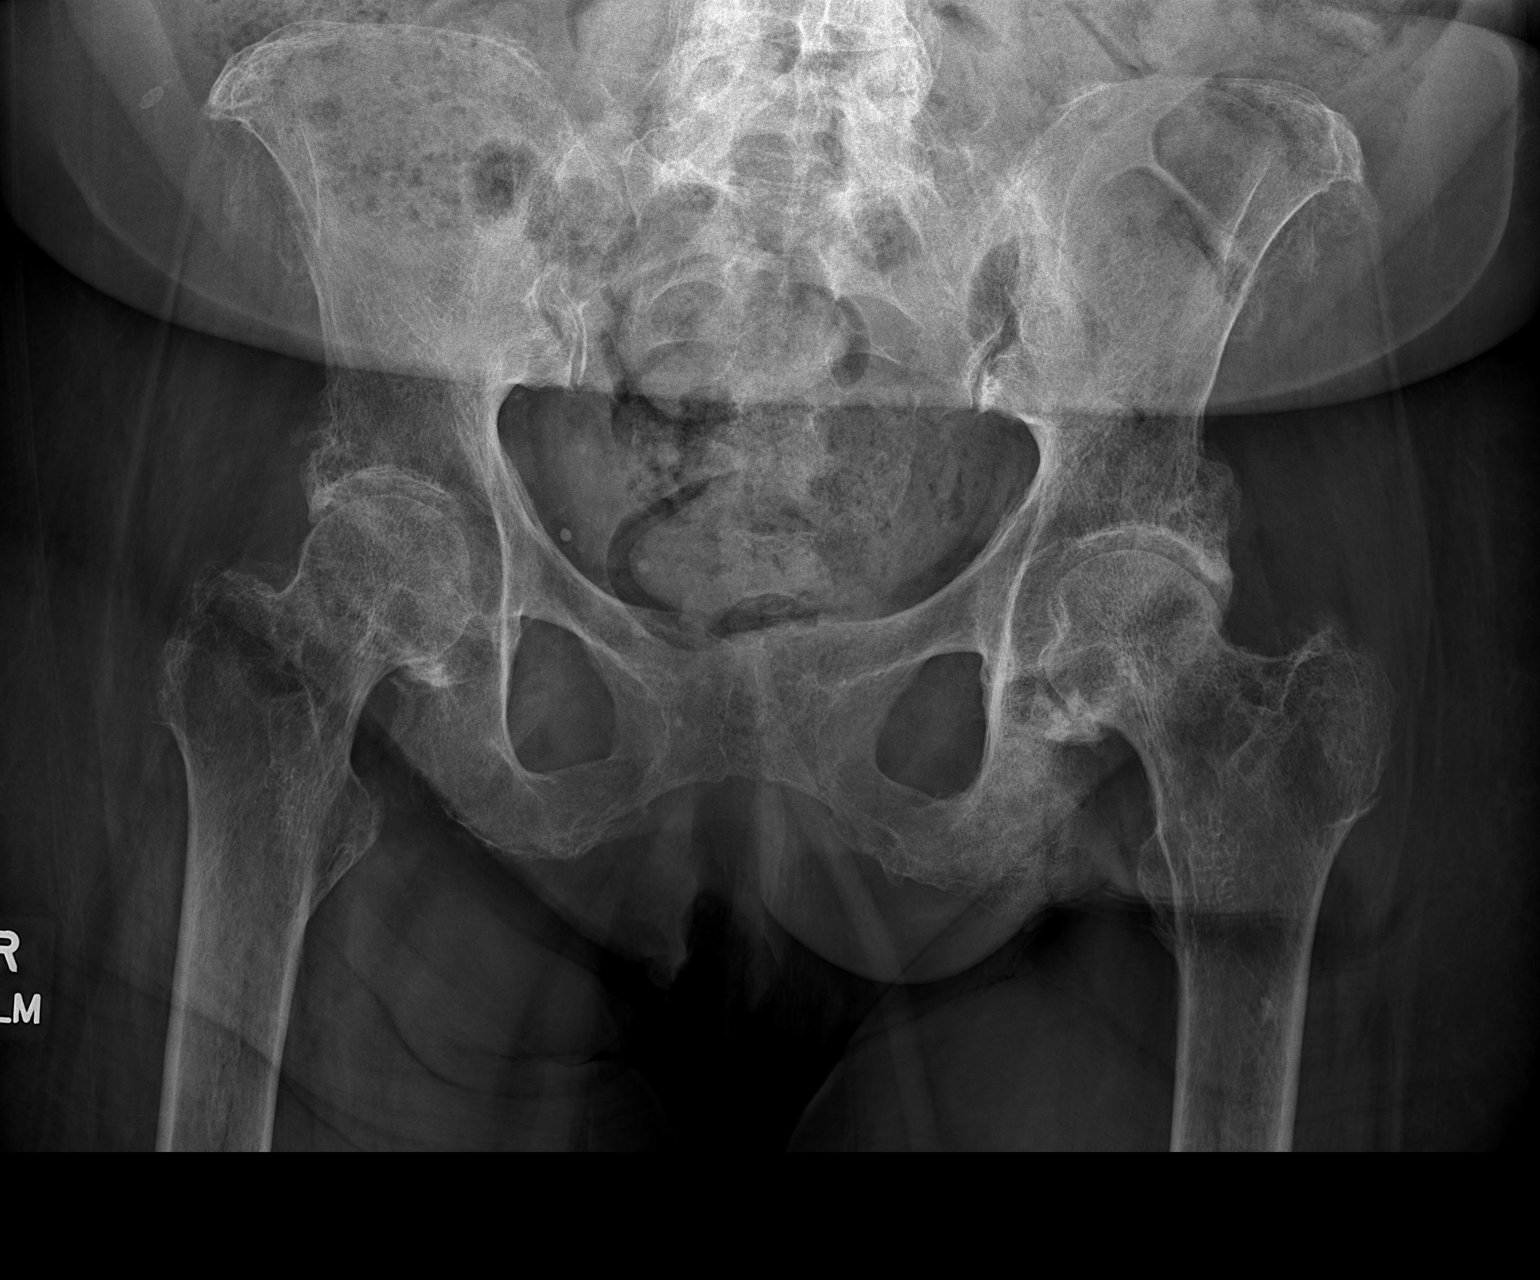

[1 of 1 positions shown; findings below may reference images not displayed]

FINDINGS: Asymmetric sclerosis and spurring at the left ischial tuberosity
which was also seen in 4141 when there was a visible ulcer extending
to bone. Generalized osteopenia. Symmetric degenerative hip
spurring.
IMPRESSION: Long-standing ulcer over the left ischial tuberosity with chronic
bony involvement/reaction that was also seen by CT in [DATE].
